# Patient Record
Sex: Male | Born: 1965
Health system: Southern US, Community
[De-identification: ages and names within clinical notes are randomized; demographics above are authoritative.]

## PROBLEM LIST (undated history)

## (undated) DIAGNOSIS — N289 Disorder of kidney and ureter, unspecified: Secondary | ICD-10-CM

## (undated) DIAGNOSIS — E785 Hyperlipidemia, unspecified: Secondary | ICD-10-CM

## (undated) DIAGNOSIS — Z992 Dependence on renal dialysis: Secondary | ICD-10-CM

## (undated) DIAGNOSIS — N186 End stage renal disease: Secondary | ICD-10-CM

## (undated) DIAGNOSIS — E559 Vitamin D deficiency, unspecified: Secondary | ICD-10-CM

## (undated) DIAGNOSIS — K819 Cholecystitis, unspecified: Secondary | ICD-10-CM

## (undated) DIAGNOSIS — H269 Unspecified cataract: Secondary | ICD-10-CM

## (undated) DIAGNOSIS — E1142 Type 2 diabetes mellitus with diabetic polyneuropathy: Secondary | ICD-10-CM

## (undated) DIAGNOSIS — M4646 Discitis, unspecified, lumbar region: Secondary | ICD-10-CM

## (undated) DIAGNOSIS — I82409 Acute embolism and thrombosis of unspecified deep veins of unspecified lower extremity: Secondary | ICD-10-CM

## (undated) DIAGNOSIS — M67919 Unspecified disorder of synovium and tendon, unspecified shoulder: Secondary | ICD-10-CM

## (undated) DIAGNOSIS — K3184 Gastroparesis: Secondary | ICD-10-CM

## (undated) DIAGNOSIS — I509 Heart failure, unspecified: Secondary | ICD-10-CM

## (undated) DIAGNOSIS — I10 Essential (primary) hypertension: Secondary | ICD-10-CM

## (undated) DIAGNOSIS — D649 Anemia, unspecified: Secondary | ICD-10-CM

## (undated) DIAGNOSIS — G5603 Carpal tunnel syndrome, bilateral upper limbs: Secondary | ICD-10-CM

## (undated) DIAGNOSIS — R269 Unspecified abnormalities of gait and mobility: Secondary | ICD-10-CM

## (undated) DIAGNOSIS — N35919 Unspecified urethral stricture, male, unspecified site: Secondary | ICD-10-CM

## (undated) DIAGNOSIS — H9190 Unspecified hearing loss, unspecified ear: Secondary | ICD-10-CM

## (undated) DIAGNOSIS — K219 Gastro-esophageal reflux disease without esophagitis: Secondary | ICD-10-CM

## (undated) DIAGNOSIS — G4730 Sleep apnea, unspecified: Secondary | ICD-10-CM

## (undated) DIAGNOSIS — E11319 Type 2 diabetes mellitus with unspecified diabetic retinopathy without macular edema: Secondary | ICD-10-CM

## (undated) DIAGNOSIS — I38 Endocarditis, valve unspecified: Secondary | ICD-10-CM

## (undated) DIAGNOSIS — R001 Bradycardia, unspecified: Secondary | ICD-10-CM

## (undated) DIAGNOSIS — I5022 Chronic systolic (congestive) heart failure: Secondary | ICD-10-CM

## (undated) DIAGNOSIS — I5020 Unspecified systolic (congestive) heart failure: Secondary | ICD-10-CM

## (undated) DIAGNOSIS — T7840XA Allergy, unspecified, initial encounter: Secondary | ICD-10-CM

## (undated) DIAGNOSIS — A419 Sepsis, unspecified organism: Secondary | ICD-10-CM

## (undated) DIAGNOSIS — I4891 Unspecified atrial fibrillation: Secondary | ICD-10-CM

## (undated) DIAGNOSIS — F40240 Claustrophobia: Secondary | ICD-10-CM

## (undated) DIAGNOSIS — M79606 Pain in leg, unspecified: Secondary | ICD-10-CM

## (undated) DIAGNOSIS — J449 Chronic obstructive pulmonary disease, unspecified: Secondary | ICD-10-CM

## (undated) DIAGNOSIS — R339 Retention of urine, unspecified: Secondary | ICD-10-CM

## (undated) DIAGNOSIS — N2581 Secondary hyperparathyroidism of renal origin: Secondary | ICD-10-CM

## (undated) DIAGNOSIS — I351 Nonrheumatic aortic (valve) insufficiency: Secondary | ICD-10-CM

## (undated) DIAGNOSIS — M869 Osteomyelitis, unspecified: Secondary | ICD-10-CM

## (undated) DIAGNOSIS — I2510 Atherosclerotic heart disease of native coronary artery without angina pectoris: Secondary | ICD-10-CM

## (undated) DIAGNOSIS — M109 Gout, unspecified: Secondary | ICD-10-CM

## (undated) DIAGNOSIS — G4733 Obstructive sleep apnea (adult) (pediatric): Secondary | ICD-10-CM

## (undated) DIAGNOSIS — F4310 Post-traumatic stress disorder, unspecified: Secondary | ICD-10-CM

## (undated) HISTORY — PX: CATARACT EXTRACTION W/ INTRAOCULAR LENS  IMPLANT, BILATERAL: SHX1307

## (undated) HISTORY — DX: Anemia, unspecified: D64.9

## (undated) HISTORY — DX: Gout, unspecified: M10.9

## (undated) HISTORY — DX: Post-traumatic stress disorder, unspecified: F43.10

## (undated) HISTORY — DX: Sleep apnea, unspecified: G47.30

## (undated) HISTORY — DX: Pain in leg, unspecified: M79.606

## (undated) HISTORY — DX: Chronic obstructive pulmonary disease, unspecified: J44.9

## (undated) HISTORY — DX: Obstructive sleep apnea (adult) (pediatric): G47.33

## (undated) HISTORY — PX: UPPER GASTROINTESTINAL ENDOSCOPY: SHX188

## (undated) HISTORY — DX: Unspecified hearing loss, unspecified ear: H91.90

## (undated) HISTORY — DX: Unspecified cataract: H26.9

## (undated) HISTORY — DX: Endocarditis, valve unspecified: I38

## (undated) HISTORY — DX: Osteomyelitis, unspecified: M86.9

## (undated) HISTORY — DX: Heart failure, unspecified: I50.9

## (undated) HISTORY — DX: Gastroparesis: K31.84

## (undated) HISTORY — DX: Carpal tunnel syndrome, bilateral upper limbs: G56.03

## (undated) HISTORY — DX: Dependence on renal dialysis: Z99.2

## (undated) HISTORY — DX: Morbid (severe) obesity due to excess calories: E66.01

## (undated) HISTORY — DX: Type 2 diabetes mellitus with unspecified diabetic retinopathy without macular edema: E11.319

## (undated) HISTORY — DX: Unspecified systolic (congestive) heart failure: I50.20

## (undated) HISTORY — PX: SIGMOIDOSCOPY: SUR1295

## (undated) HISTORY — DX: Nonrheumatic aortic (valve) insufficiency: I35.1

## (undated) HISTORY — PX: ARTERIOVENOUS GRAFT PLACEMENT: SUR1029

## (undated) HISTORY — DX: Unspecified disorder of synovium and tendon, unspecified shoulder: M67.919

## (undated) HISTORY — DX: Vitamin D deficiency, unspecified: E55.9

## (undated) HISTORY — DX: Discitis, unspecified, lumbar region: M46.46

## (undated) HISTORY — DX: Hyperlipidemia, unspecified: E78.5

## (undated) HISTORY — DX: Allergy, unspecified, initial encounter: T78.40XA

## (undated) HISTORY — DX: Secondary hyperparathyroidism of renal origin: N25.81

## (undated) HISTORY — DX: Type 2 diabetes mellitus with diabetic polyneuropathy: E11.42

## (undated) HISTORY — DX: Essential (primary) hypertension: I10

## (undated) HISTORY — DX: Unspecified abnormalities of gait and mobility: R26.9

---
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
## 1998-05-10 ENCOUNTER — Other Ambulatory Visit: Admission: RE | Admit: 1998-05-10 | Discharge: 1998-05-10 | Payer: Self-pay

## 1998-05-25 ENCOUNTER — Emergency Department (HOSPITAL_COMMUNITY): Admission: EM | Admit: 1998-05-25 | Discharge: 1998-05-25 | Payer: Self-pay | Admitting: Emergency Medicine

## 1999-04-09 ENCOUNTER — Encounter: Payer: Self-pay | Admitting: Internal Medicine

## 1999-04-09 ENCOUNTER — Ambulatory Visit (HOSPITAL_COMMUNITY): Admission: RE | Admit: 1999-04-09 | Discharge: 1999-04-09 | Payer: Self-pay | Admitting: Internal Medicine

## 1999-04-16 ENCOUNTER — Encounter: Admission: RE | Admit: 1999-04-16 | Discharge: 1999-07-15 | Payer: Self-pay | Admitting: Internal Medicine

## 1999-08-04 ENCOUNTER — Encounter: Admission: RE | Admit: 1999-08-04 | Discharge: 1999-11-02 | Payer: Self-pay | Admitting: Orthopedic Surgery

## 1999-11-03 ENCOUNTER — Encounter: Admission: RE | Admit: 1999-11-03 | Discharge: 2000-02-01 | Payer: Self-pay | Admitting: Orthopedic Surgery

## 2000-06-15 ENCOUNTER — Encounter: Admission: RE | Admit: 2000-06-15 | Discharge: 2000-09-13 | Payer: Self-pay | Admitting: Orthopedic Surgery

## 2000-07-30 ENCOUNTER — Encounter: Payer: Self-pay | Admitting: Internal Medicine

## 2000-07-30 ENCOUNTER — Inpatient Hospital Stay (HOSPITAL_COMMUNITY): Admission: AD | Admit: 2000-07-30 | Discharge: 2000-08-04 | Payer: Self-pay | Admitting: Internal Medicine

## 2000-07-31 ENCOUNTER — Encounter: Payer: Self-pay | Admitting: Internal Medicine

## 2000-08-26 ENCOUNTER — Encounter: Admission: RE | Admit: 2000-08-26 | Discharge: 2000-08-26 | Payer: Self-pay | Admitting: Family Medicine

## 2000-09-07 ENCOUNTER — Encounter: Admission: RE | Admit: 2000-09-07 | Discharge: 2000-09-07 | Payer: Self-pay | Admitting: Family Medicine

## 2000-09-22 ENCOUNTER — Encounter: Admission: RE | Admit: 2000-09-22 | Discharge: 2000-09-22 | Payer: Self-pay | Admitting: Family Medicine

## 2001-08-23 ENCOUNTER — Encounter: Admission: RE | Admit: 2001-08-23 | Discharge: 2001-11-21 | Payer: Self-pay | Admitting: Internal Medicine

## 2001-09-05 ENCOUNTER — Emergency Department (HOSPITAL_COMMUNITY): Admission: EM | Admit: 2001-09-05 | Discharge: 2001-09-06 | Payer: Self-pay | Admitting: Physical Therapy

## 2001-09-14 ENCOUNTER — Emergency Department (HOSPITAL_COMMUNITY): Admission: EM | Admit: 2001-09-14 | Discharge: 2001-09-14 | Payer: Self-pay | Admitting: Emergency Medicine

## 2001-11-10 ENCOUNTER — Emergency Department (HOSPITAL_COMMUNITY): Admission: EM | Admit: 2001-11-10 | Discharge: 2001-11-10 | Payer: Self-pay | Admitting: Emergency Medicine

## 2001-11-16 ENCOUNTER — Encounter: Payer: Self-pay | Admitting: Internal Medicine

## 2001-11-16 ENCOUNTER — Ambulatory Visit (HOSPITAL_COMMUNITY): Admission: RE | Admit: 2001-11-16 | Discharge: 2001-11-16 | Payer: Self-pay | Admitting: Internal Medicine

## 2001-11-22 ENCOUNTER — Encounter: Admission: RE | Admit: 2001-11-22 | Discharge: 2002-01-13 | Payer: Self-pay | Admitting: Orthopedic Surgery

## 2002-04-27 ENCOUNTER — Emergency Department (HOSPITAL_COMMUNITY): Admission: EM | Admit: 2002-04-27 | Discharge: 2002-04-27 | Payer: Self-pay | Admitting: Emergency Medicine

## 2002-04-29 ENCOUNTER — Ambulatory Visit (HOSPITAL_COMMUNITY): Admission: RE | Admit: 2002-04-29 | Discharge: 2002-04-29 | Payer: Self-pay | Admitting: Emergency Medicine

## 2002-05-04 ENCOUNTER — Ambulatory Visit (HOSPITAL_COMMUNITY): Admission: RE | Admit: 2002-05-04 | Discharge: 2002-05-04 | Payer: Self-pay | Admitting: Internal Medicine

## 2002-06-07 ENCOUNTER — Encounter: Admission: RE | Admit: 2002-06-07 | Discharge: 2002-06-07 | Payer: Self-pay | Admitting: Internal Medicine

## 2002-06-14 ENCOUNTER — Ambulatory Visit (HOSPITAL_COMMUNITY): Admission: RE | Admit: 2002-06-14 | Discharge: 2002-06-14 | Payer: Self-pay | Admitting: Internal Medicine

## 2002-06-14 ENCOUNTER — Encounter: Payer: Self-pay | Admitting: Internal Medicine

## 2002-06-14 ENCOUNTER — Encounter: Admission: RE | Admit: 2002-06-14 | Discharge: 2002-06-14 | Payer: Self-pay | Admitting: Internal Medicine

## 2002-06-15 ENCOUNTER — Encounter: Payer: Self-pay | Admitting: Internal Medicine

## 2002-06-15 ENCOUNTER — Ambulatory Visit (HOSPITAL_COMMUNITY): Admission: RE | Admit: 2002-06-15 | Discharge: 2002-06-15 | Payer: Self-pay | Admitting: Internal Medicine

## 2002-06-20 ENCOUNTER — Encounter: Admission: RE | Admit: 2002-06-20 | Discharge: 2002-06-20 | Payer: Self-pay | Admitting: Internal Medicine

## 2002-06-22 ENCOUNTER — Ambulatory Visit (HOSPITAL_COMMUNITY): Admission: RE | Admit: 2002-06-22 | Discharge: 2002-06-22 | Payer: Self-pay | Admitting: Internal Medicine

## 2002-10-25 ENCOUNTER — Encounter: Admission: RE | Admit: 2002-10-25 | Discharge: 2002-10-25 | Payer: Self-pay | Admitting: Internal Medicine

## 2003-02-14 ENCOUNTER — Encounter (HOSPITAL_BASED_OUTPATIENT_CLINIC_OR_DEPARTMENT_OTHER): Admission: RE | Admit: 2003-02-14 | Discharge: 2003-05-15 | Payer: Self-pay | Admitting: Internal Medicine

## 2003-02-28 ENCOUNTER — Encounter: Admission: RE | Admit: 2003-02-28 | Discharge: 2003-02-28 | Payer: Self-pay | Admitting: Internal Medicine

## 2003-09-22 ENCOUNTER — Inpatient Hospital Stay (HOSPITAL_COMMUNITY): Admission: EM | Admit: 2003-09-22 | Discharge: 2003-09-28 | Payer: Self-pay | Admitting: *Deleted

## 2003-10-01 ENCOUNTER — Encounter: Admission: RE | Admit: 2003-10-01 | Discharge: 2003-10-01 | Payer: Self-pay | Admitting: Internal Medicine

## 2003-10-01 ENCOUNTER — Encounter: Admission: RE | Admit: 2003-10-01 | Discharge: 2003-12-28 | Payer: Self-pay | Admitting: Internal Medicine

## 2003-10-08 ENCOUNTER — Encounter: Admission: RE | Admit: 2003-10-08 | Discharge: 2003-10-08 | Payer: Self-pay | Admitting: Internal Medicine

## 2004-01-03 ENCOUNTER — Inpatient Hospital Stay (HOSPITAL_COMMUNITY): Admission: EM | Admit: 2004-01-03 | Discharge: 2004-01-05 | Payer: Self-pay

## 2004-01-30 ENCOUNTER — Encounter (HOSPITAL_BASED_OUTPATIENT_CLINIC_OR_DEPARTMENT_OTHER): Admission: RE | Admit: 2004-01-30 | Discharge: 2004-04-29 | Payer: Self-pay | Admitting: Internal Medicine

## 2004-07-04 ENCOUNTER — Ambulatory Visit: Payer: Self-pay | Admitting: Internal Medicine

## 2004-07-04 ENCOUNTER — Inpatient Hospital Stay (HOSPITAL_COMMUNITY): Admission: EM | Admit: 2004-07-04 | Discharge: 2004-07-16 | Payer: Self-pay | Admitting: Emergency Medicine

## 2004-07-14 ENCOUNTER — Encounter: Payer: Self-pay | Admitting: Internal Medicine

## 2004-07-23 ENCOUNTER — Ambulatory Visit (HOSPITAL_COMMUNITY): Admission: RE | Admit: 2004-07-23 | Discharge: 2004-07-23 | Payer: Self-pay | Admitting: Nephrology

## 2004-09-01 ENCOUNTER — Ambulatory Visit (HOSPITAL_COMMUNITY): Admission: RE | Admit: 2004-09-01 | Discharge: 2004-09-01 | Payer: Self-pay | Admitting: Vascular Surgery

## 2004-10-12 HISTORY — PX: EYE SURGERY: SHX253

## 2005-03-28 ENCOUNTER — Inpatient Hospital Stay (HOSPITAL_COMMUNITY): Admission: EM | Admit: 2005-03-28 | Discharge: 2005-04-03 | Payer: Self-pay | Admitting: Emergency Medicine

## 2005-04-30 ENCOUNTER — Encounter (HOSPITAL_BASED_OUTPATIENT_CLINIC_OR_DEPARTMENT_OTHER): Admission: RE | Admit: 2005-04-30 | Discharge: 2005-07-29 | Payer: Self-pay | Admitting: Surgery

## 2005-05-11 ENCOUNTER — Encounter: Admission: RE | Admit: 2005-05-11 | Discharge: 2005-05-11 | Payer: Self-pay | Admitting: Nephrology

## 2005-06-10 ENCOUNTER — Encounter: Admission: RE | Admit: 2005-06-10 | Discharge: 2005-06-10 | Payer: Self-pay | Admitting: Nephrology

## 2005-06-11 ENCOUNTER — Ambulatory Visit: Payer: Self-pay | Admitting: Cardiology

## 2005-06-11 ENCOUNTER — Inpatient Hospital Stay (HOSPITAL_COMMUNITY): Admission: EM | Admit: 2005-06-11 | Discharge: 2005-06-13 | Payer: Self-pay | Admitting: Emergency Medicine

## 2005-06-26 ENCOUNTER — Encounter: Admission: RE | Admit: 2005-06-26 | Discharge: 2005-08-11 | Payer: Self-pay | Admitting: Orthopedic Surgery

## 2005-12-01 ENCOUNTER — Encounter (HOSPITAL_BASED_OUTPATIENT_CLINIC_OR_DEPARTMENT_OTHER): Admission: RE | Admit: 2005-12-01 | Discharge: 2006-01-12 | Payer: Self-pay | Admitting: Surgery

## 2006-03-23 ENCOUNTER — Ambulatory Visit: Payer: Self-pay | Admitting: Cardiology

## 2006-03-30 ENCOUNTER — Encounter: Payer: Self-pay | Admitting: Cardiology

## 2006-03-30 ENCOUNTER — Ambulatory Visit: Payer: Self-pay

## 2006-05-13 ENCOUNTER — Ambulatory Visit: Payer: Self-pay | Admitting: Cardiology

## 2006-06-08 ENCOUNTER — Emergency Department (HOSPITAL_COMMUNITY): Admission: EM | Admit: 2006-06-08 | Discharge: 2006-06-09 | Payer: Self-pay | Admitting: Emergency Medicine

## 2006-07-01 ENCOUNTER — Ambulatory Visit: Payer: Self-pay | Admitting: Pulmonary Disease

## 2006-08-20 ENCOUNTER — Ambulatory Visit: Payer: Self-pay | Admitting: Pulmonary Disease

## 2006-08-20 ENCOUNTER — Ambulatory Visit (HOSPITAL_BASED_OUTPATIENT_CLINIC_OR_DEPARTMENT_OTHER): Admission: RE | Admit: 2006-08-20 | Discharge: 2006-08-20 | Payer: Self-pay | Admitting: Pulmonary Disease

## 2006-09-09 ENCOUNTER — Ambulatory Visit: Payer: Self-pay | Admitting: Pulmonary Disease

## 2006-09-28 ENCOUNTER — Ambulatory Visit: Payer: Self-pay | Admitting: Pulmonary Disease

## 2006-11-03 ENCOUNTER — Ambulatory Visit: Payer: Self-pay | Admitting: Pulmonary Disease

## 2006-11-03 ENCOUNTER — Ambulatory Visit (HOSPITAL_BASED_OUTPATIENT_CLINIC_OR_DEPARTMENT_OTHER): Admission: RE | Admit: 2006-11-03 | Discharge: 2006-11-03 | Payer: Self-pay | Admitting: Pulmonary Disease

## 2007-04-08 ENCOUNTER — Encounter: Admission: RE | Admit: 2007-04-08 | Discharge: 2007-04-08 | Payer: Self-pay | Admitting: Nephrology

## 2007-04-12 ENCOUNTER — Ambulatory Visit (HOSPITAL_COMMUNITY): Admission: RE | Admit: 2007-04-12 | Discharge: 2007-04-12 | Payer: Self-pay | Admitting: Nephrology

## 2007-06-09 ENCOUNTER — Emergency Department (HOSPITAL_COMMUNITY): Admission: EM | Admit: 2007-06-09 | Discharge: 2007-06-09 | Payer: Self-pay | Admitting: Family Medicine

## 2007-06-09 ENCOUNTER — Emergency Department (HOSPITAL_COMMUNITY): Admission: EM | Admit: 2007-06-09 | Discharge: 2007-06-09 | Payer: Self-pay | Admitting: Emergency Medicine

## 2007-08-05 ENCOUNTER — Emergency Department (HOSPITAL_COMMUNITY): Admission: EM | Admit: 2007-08-05 | Discharge: 2007-08-05 | Payer: Self-pay | Admitting: Family Medicine

## 2007-08-21 ENCOUNTER — Emergency Department (HOSPITAL_COMMUNITY): Admission: EM | Admit: 2007-08-21 | Discharge: 2007-08-22 | Payer: Self-pay | Admitting: Emergency Medicine

## 2007-08-22 ENCOUNTER — Ambulatory Visit: Payer: Self-pay | Admitting: *Deleted

## 2007-08-22 ENCOUNTER — Encounter (INDEPENDENT_AMBULATORY_CARE_PROVIDER_SITE_OTHER): Payer: Self-pay | Admitting: Emergency Medicine

## 2007-08-22 ENCOUNTER — Ambulatory Visit (HOSPITAL_COMMUNITY): Admission: RE | Admit: 2007-08-22 | Discharge: 2007-08-22 | Payer: Self-pay | Admitting: Emergency Medicine

## 2007-10-13 HISTORY — PX: KIDNEY TRANSPLANT: SHX239

## 2007-11-03 ENCOUNTER — Encounter: Admission: RE | Admit: 2007-11-03 | Discharge: 2007-11-03 | Payer: Self-pay | Admitting: Nephrology

## 2007-12-11 ENCOUNTER — Inpatient Hospital Stay (HOSPITAL_COMMUNITY): Admission: EM | Admit: 2007-12-11 | Discharge: 2007-12-13 | Payer: Self-pay | Admitting: Emergency Medicine

## 2007-12-11 ENCOUNTER — Ambulatory Visit: Payer: Self-pay | Admitting: Cardiology

## 2007-12-13 ENCOUNTER — Encounter (INDEPENDENT_AMBULATORY_CARE_PROVIDER_SITE_OTHER): Payer: Self-pay | Admitting: Cardiology

## 2007-12-27 ENCOUNTER — Ambulatory Visit (HOSPITAL_COMMUNITY): Admission: RE | Admit: 2007-12-27 | Discharge: 2007-12-27 | Payer: Self-pay | Admitting: Nephrology

## 2008-01-17 ENCOUNTER — Ambulatory Visit: Payer: Self-pay | Admitting: Vascular Surgery

## 2008-01-19 ENCOUNTER — Ambulatory Visit: Payer: Self-pay | Admitting: Vascular Surgery

## 2008-01-19 ENCOUNTER — Ambulatory Visit (HOSPITAL_COMMUNITY): Admission: RE | Admit: 2008-01-19 | Discharge: 2008-01-19 | Payer: Self-pay | Admitting: Vascular Surgery

## 2008-02-16 ENCOUNTER — Telehealth: Payer: Self-pay | Admitting: Pulmonary Disease

## 2008-02-28 ENCOUNTER — Encounter (INDEPENDENT_AMBULATORY_CARE_PROVIDER_SITE_OTHER): Payer: Self-pay | Admitting: *Deleted

## 2008-05-25 DIAGNOSIS — I10 Essential (primary) hypertension: Secondary | ICD-10-CM | POA: Insufficient documentation

## 2008-05-25 DIAGNOSIS — E1139 Type 2 diabetes mellitus with other diabetic ophthalmic complication: Secondary | ICD-10-CM | POA: Insufficient documentation

## 2008-05-25 DIAGNOSIS — E785 Hyperlipidemia, unspecified: Secondary | ICD-10-CM | POA: Insufficient documentation

## 2008-05-25 DIAGNOSIS — G4733 Obstructive sleep apnea (adult) (pediatric): Secondary | ICD-10-CM | POA: Insufficient documentation

## 2008-05-25 DIAGNOSIS — F528 Other sexual dysfunction not due to a substance or known physiological condition: Secondary | ICD-10-CM | POA: Insufficient documentation

## 2008-05-28 ENCOUNTER — Ambulatory Visit: Payer: Self-pay | Admitting: Pulmonary Disease

## 2008-06-14 ENCOUNTER — Encounter: Payer: Self-pay | Admitting: Pulmonary Disease

## 2008-07-03 ENCOUNTER — Encounter: Payer: Self-pay | Admitting: Pulmonary Disease

## 2008-07-19 ENCOUNTER — Ambulatory Visit: Payer: Self-pay | Admitting: Pulmonary Disease

## 2008-11-21 ENCOUNTER — Encounter: Admission: RE | Admit: 2008-11-21 | Discharge: 2009-01-25 | Payer: Self-pay | Admitting: Neurology

## 2009-02-28 ENCOUNTER — Ambulatory Visit (HOSPITAL_COMMUNITY): Admission: RE | Admit: 2009-02-28 | Discharge: 2009-02-28 | Payer: Self-pay | Admitting: Neurology

## 2009-11-04 ENCOUNTER — Encounter: Payer: Self-pay | Admitting: Internal Medicine

## 2009-12-09 ENCOUNTER — Encounter: Payer: Self-pay | Admitting: Internal Medicine

## 2009-12-31 ENCOUNTER — Inpatient Hospital Stay (HOSPITAL_COMMUNITY): Admission: EM | Admit: 2009-12-31 | Discharge: 2010-01-02 | Payer: Self-pay | Admitting: Emergency Medicine

## 2010-01-15 ENCOUNTER — Encounter (INDEPENDENT_AMBULATORY_CARE_PROVIDER_SITE_OTHER): Payer: Self-pay | Admitting: *Deleted

## 2010-01-15 ENCOUNTER — Encounter: Payer: Self-pay | Admitting: Gastroenterology

## 2010-01-16 ENCOUNTER — Encounter: Payer: Self-pay | Admitting: Internal Medicine

## 2010-01-27 ENCOUNTER — Telehealth (INDEPENDENT_AMBULATORY_CARE_PROVIDER_SITE_OTHER): Payer: Self-pay | Admitting: *Deleted

## 2010-01-27 DIAGNOSIS — M7500 Adhesive capsulitis of unspecified shoulder: Secondary | ICD-10-CM | POA: Insufficient documentation

## 2010-01-27 DIAGNOSIS — N186 End stage renal disease: Secondary | ICD-10-CM | POA: Insufficient documentation

## 2010-01-27 DIAGNOSIS — F419 Anxiety disorder, unspecified: Secondary | ICD-10-CM | POA: Insufficient documentation

## 2010-01-27 DIAGNOSIS — M109 Gout, unspecified: Secondary | ICD-10-CM | POA: Insufficient documentation

## 2010-01-27 DIAGNOSIS — K3184 Gastroparesis: Secondary | ICD-10-CM | POA: Insufficient documentation

## 2010-01-27 DIAGNOSIS — E211 Secondary hyperparathyroidism, not elsewhere classified: Secondary | ICD-10-CM | POA: Insufficient documentation

## 2010-01-27 DIAGNOSIS — G4700 Insomnia, unspecified: Secondary | ICD-10-CM | POA: Insufficient documentation

## 2010-01-27 DIAGNOSIS — E109 Type 1 diabetes mellitus without complications: Secondary | ICD-10-CM | POA: Insufficient documentation

## 2010-01-30 ENCOUNTER — Ambulatory Visit: Payer: Self-pay | Admitting: Internal Medicine

## 2010-01-30 DIAGNOSIS — Z940 Kidney transplant status: Secondary | ICD-10-CM | POA: Insufficient documentation

## 2010-01-30 DIAGNOSIS — K6289 Other specified diseases of anus and rectum: Secondary | ICD-10-CM | POA: Insufficient documentation

## 2010-01-30 DIAGNOSIS — R197 Diarrhea, unspecified: Secondary | ICD-10-CM | POA: Insufficient documentation

## 2010-01-30 DIAGNOSIS — R112 Nausea with vomiting, unspecified: Secondary | ICD-10-CM | POA: Insufficient documentation

## 2010-01-30 DIAGNOSIS — D899 Disorder involving the immune mechanism, unspecified: Secondary | ICD-10-CM | POA: Insufficient documentation

## 2010-02-03 ENCOUNTER — Ambulatory Visit: Payer: Self-pay | Admitting: Gastroenterology

## 2010-02-05 ENCOUNTER — Telehealth: Payer: Self-pay | Admitting: Internal Medicine

## 2010-02-14 ENCOUNTER — Encounter: Payer: Self-pay | Admitting: Internal Medicine

## 2010-02-20 ENCOUNTER — Encounter: Payer: Self-pay | Admitting: Internal Medicine

## 2010-04-17 ENCOUNTER — Encounter: Payer: Self-pay | Admitting: Cardiovascular Disease

## 2010-04-23 ENCOUNTER — Encounter: Payer: Self-pay | Admitting: Cardiovascular Disease

## 2010-05-01 ENCOUNTER — Encounter: Payer: Self-pay | Admitting: Cardiovascular Disease

## 2010-05-02 ENCOUNTER — Encounter: Payer: Self-pay | Admitting: Cardiovascular Disease

## 2010-06-03 ENCOUNTER — Ambulatory Visit: Payer: Self-pay | Admitting: Cardiovascular Disease

## 2010-06-03 DIAGNOSIS — R079 Chest pain, unspecified: Secondary | ICD-10-CM | POA: Insufficient documentation

## 2010-06-23 ENCOUNTER — Ambulatory Visit: Payer: Self-pay

## 2010-06-24 ENCOUNTER — Telehealth (INDEPENDENT_AMBULATORY_CARE_PROVIDER_SITE_OTHER): Payer: Self-pay

## 2010-06-25 ENCOUNTER — Ambulatory Visit: Payer: Self-pay

## 2010-06-25 ENCOUNTER — Ambulatory Visit (HOSPITAL_COMMUNITY): Admission: RE | Admit: 2010-06-25 | Discharge: 2010-06-25 | Payer: Self-pay | Admitting: Cardiovascular Disease

## 2010-06-25 ENCOUNTER — Encounter: Payer: Self-pay | Admitting: Cardiology

## 2010-06-25 ENCOUNTER — Encounter: Payer: Self-pay | Admitting: Cardiovascular Disease

## 2010-06-25 ENCOUNTER — Encounter (HOSPITAL_COMMUNITY): Admission: RE | Admit: 2010-06-25 | Discharge: 2010-08-13 | Payer: Self-pay | Admitting: Cardiovascular Disease

## 2010-06-25 ENCOUNTER — Ambulatory Visit: Payer: Self-pay | Admitting: Cardiology

## 2010-06-27 ENCOUNTER — Ambulatory Visit: Payer: Self-pay | Admitting: Cardiovascular Disease

## 2010-06-27 DIAGNOSIS — I119 Hypertensive heart disease without heart failure: Secondary | ICD-10-CM | POA: Insufficient documentation

## 2010-07-04 ENCOUNTER — Encounter: Payer: Self-pay | Admitting: Cardiovascular Disease

## 2010-07-04 ENCOUNTER — Telehealth: Payer: Self-pay | Admitting: Cardiovascular Disease

## 2010-07-29 ENCOUNTER — Encounter: Payer: Self-pay | Admitting: Cardiovascular Disease

## 2010-07-30 ENCOUNTER — Encounter: Payer: Self-pay | Admitting: Cardiovascular Disease

## 2010-10-11 ENCOUNTER — Emergency Department (HOSPITAL_COMMUNITY)
Admission: EM | Admit: 2010-10-11 | Discharge: 2010-10-11 | Payer: Self-pay | Source: Home / Self Care | Admitting: Emergency Medicine

## 2010-11-02 ENCOUNTER — Encounter: Payer: Self-pay | Admitting: Neurology

## 2010-11-02 ENCOUNTER — Encounter: Payer: Self-pay | Admitting: Nephrology

## 2010-11-03 ENCOUNTER — Encounter: Payer: Self-pay | Admitting: Neurology

## 2010-11-03 ENCOUNTER — Encounter: Payer: Self-pay | Admitting: Nephrology

## 2010-11-13 NOTE — Letter (Signed)
Summary: [facility]   Imported By: Phillis Knack 02/06/2010 12:02:09  _____________________________________________________________________  External Attachment:    Type:   Image     Comment:   External Document

## 2010-11-13 NOTE — Letter (Signed)
Summary: [facility]   Imported By: Rise Patience 03/19/2010 16:15:47  _____________________________________________________________________  External Attachment:    Type:   Image     Comment:   External Document

## 2010-11-13 NOTE — Letter (Signed)
Summary: [facility], [place]  [address] [place]   [place],  [address]   Phone: [phone]  Fax: [phone]     July 04, 2010    [name] [name]   The above named patient had a medical visit on 06/27/10 with Dr. Lauree Chandler and had a stress test on 06/24/10 and an echocardiogram on 06/25/10.  Please take this into consideration when reviewing the time away from school.    Sincerely yours,  [facility] Jeannine Boga,  RN

## 2010-11-13 NOTE — Letter (Signed)
Summary: [facility]   Imported By: Phillis Knack 02/06/2010 12:00:49  _____________________________________________________________________  External Attachment:    Type:   Image     Comment:   External Document

## 2010-11-13 NOTE — Progress Notes (Signed)
Summary: [name] [name] pt.  ---- Converted from flag ---- ---- 01/27/2010 12:16 PM, Inda Castle MD wrote: He can see Dr. Carlean [name]  ---- 01/27/2010 12:11 PM, Bernita Buffy CMA (AAMA) wrote: patient is on your schedule for wed  but he has had a procedure with Dr. Carlean [name] 2005 wile inpt at [facility] and Dr. Carlean [name] did consult while he was inpt then. Let me know if you want to see him or have him moved to Dr. Carlean [name].  Thanks  [name] [name] ------------------------------ [name] [name] will contact the patient and resch him with Dr. Carlean [name].

## 2010-11-13 NOTE — Progress Notes (Signed)
Summary: OV needed with Dr. Jessie Foot  Phone Note Outgoing Call   Call placed by: Francesca Jewett CMA,  Feb 16, 2008 12:19 PM Call placed to: Patient Summary of Call: Left message for patient to call and make follow-up appointment with Dr. Halford Chessman. CMN for CPAP supplies cannot be completed until the patient comes in for OV. Initial call taken by: Francesca Jewett CMA,  Feb 16, 2008 12:21 PM  Follow-up for Phone Call        [facility] for appt w/ [place]  Feb 20, 2008 5:00 PM  [facility]. Francesca Jewett CMA  Feb 24, 2008 3:55 PM    Additional Follow-up for Phone Call Additional follow up Details #1::        Vallarie Mare,  Please send a letter to this patients home to call for an appt with Dr. Halford Chessman. Thanks, Lori  SENT LETTER OUT ON 02/28/08. CHANTEL

## 2010-11-13 NOTE — Letter (Signed)
Summary: [facility]   Imported By: Rise Patience 01/30/2010 14:56:49  _____________________________________________________________________  External Attachment:    Type:   Image     Comment:   External Document

## 2010-11-13 NOTE — Progress Notes (Signed)
Summary: pt needs note for work  Phone Note Call from Patient Call back at [employer] [phone]   Caller: Patient Reason for Call: Talk to Nurse, Talk to Doctor Summary of Call: pt needs a note for work for the days he had testing and MD appt Initial call taken by: Shelda Pal,  July 04, 2010 3:52 PM
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
## 2010-11-13 NOTE — Assessment & Plan Note (Signed)
Summary: rov///kp   Referred by:  Legrand Como Altheimer PCP:  Jamal Maes  Chief Complaint:  Pt states he is doing well on CPAP. He soes c/o some left-sided nasal congestion at times..  History of Present Illness: I saw [name] [name] in follow up for his severe OSA.  He says that his DME never picked up his CPAP download.  He feels that he is able to tolerate his CPAP better now.  He uses the machine on a nightly basis, and for the majority of the night.  He gets congestion and feels like something is blocking the left side of his nose.  He has difficulty using his CPAP because of this.    He is currently using a full face mask with humidifer.     Prior Medications Reviewed Using: Patient Recall  Prior Medication List:  HUMULIN 70/30 70-30 %  SUSP (INSULIN ISOPHANE & REGULAR) 30 units in AM and 28 units in PM CLONAZEPAM 0.5 MG  TABS (CLONAZEPAM) 1 to 2 at bedtime SIMVASTATIN 20 MG  TABS (SIMVASTATIN) 1 by mouth daily ALLEGRA 60 MG  TABS (FEXOFENADINE HCL) 1 by mouth two times a day METOPROLOL TARTRATE 25 MG  TABS (METOPROLOL TARTRATE) 1 by mouth two times a day HYDRALAZINE HCL 25 MG  TABS (HYDRALAZINE HCL) 1 by mouth four times daily BACTRIM DS 800-160 MG  TABS (SULFAMETHOXAZOLE-TRIMETHOPRIM) 1 by mouth daily PRILOSEC 20 MG  CPDR (OMEPRAZOLE) 1 by mouth daily PROGRAF 1 MG  CAPS (TACROLIMUS) 1 by mouth two times a day CELLCEPT 250 MG  CAPS (MYCOPHENOLATE MOFETIL) 4 by mouth two times a day PREDNISONE 5 MG  TABS (PREDNISONE) 2 by mouth daily VALCYTE 450 MG  TABS (VALGANCICLOVIR HCL) M,W,F FLUCONAZOLE 50 MG  TABS (FLUCONAZOLE) 1 by mouth daily LASIX 40 MG  TABS (FUROSEMIDE) 120 mg in the morning and 80 mg at night FLOMAX 0.4 MG  XR24H-CAP (TAMSULOSIN HCL) 1 by mouth daily   Current Allergies (reviewed today): ! PCN  Past Medical History:    IDDM    ESRD s/p renal transplant    Retinopathy    HTN    Dyslipidemia    Erectile dysfunction    PTSD    Pericardial effusion  Pneumonia    ACE induced cough    Severe OSA, PSG 11/07 AHI [id number]    Diastolic dysfunction          Vital Signs:  Patient Profile:   45 Years Old Male Weight:      269 pounds O2 Sat:      98 % O2 treatment:    Room Air Temp:     97.7 degrees F oral Pulse rate:   97 / minute BP sitting:   110 / 66  (right arm) Cuff size:   regular  Vitals Entered By: Francesca Jewett CMA (July 19, 2008 11:39 AM)                 Physical Exam  General:     normal appearance and obese.   Nose:     no sinus tenderness, clear nasal discharge Mouth:     MP 4, enlarged tongue Neck:     no LAN Lungs:     Decreased breath sounds, no wheezing Heart:     regular rhythm, normal rate, and no murmurs.   Abdomen:     obese, soft, well healed surgical scar Extremities:     1+ edema      Impression & Recommendations:  Problem #  1:  OBSTRUCTIVE SLEEP APNEA (ICD-327.23) I have encouraged him to maintain his compliance with CPAP therapy.  I will contact his DME company to get a report from his CPAP machine.  I will refer him to ENT to evaluate what the nature of his left sided nasal blockage is.  Hopefully by addressing this he will be able to tolerate CPAP therapy better.   Complete Medication List: 1)  Humulin 70/30 70-30 % Susp (Insulin isophane & regular) .... 30 units in am and 28 units in pm 2)  Clonazepam 0.5 Mg Tabs (Clonazepam) .Marland Kitchen.. 1 to 2 at bedtime 3)  Simvastatin 20 Mg Tabs (Simvastatin) .Marland Kitchen.. 1 by mouth daily 4)  Allegra 60 Mg Tabs (Fexofenadine hcl) .Marland Kitchen.. 1 by mouth two times a day 5)  Metoprolol Tartrate 25 Mg Tabs (Metoprolol tartrate) .Marland Kitchen.. 1 by mouth two times a day 6)  Hydralazine Hcl 25 Mg Tabs (Hydralazine hcl) .Marland Kitchen.. 1 by mouth four times daily 7)  Bactrim Ds 800-160 Mg Tabs (Sulfamethoxazole-trimethoprim) .Marland Kitchen.. 1 by mouth daily 8)  Prilosec 20 Mg Cpdr (Omeprazole) .Marland Kitchen.. 1 by mouth daily 9)  Prograf 1 Mg Caps (Tacrolimus) .Marland Kitchen.. 1 by mouth two times a day 10)  Cellcept 250 Mg  Caps (Mycophenolate mofetil) .... 4 by mouth two times a day 11)  Prednisone 5 Mg Tabs (Prednisone) .... 2 by mouth daily 12)  Valcyte 450 Mg Tabs (Valganciclovir hcl) .... M,w,f 13)  Fluconazole 50 Mg Tabs (Fluconazole) .Marland Kitchen.. 1 by mouth daily 14)  Lasix 40 Mg Tabs (Furosemide) .Marland Kitchen.. 120 mg in the morning and 80 mg at night 15)  Flomax 0.4 Mg Xr24h-cap (Tamsulosin hcl) .Marland Kitchen.. 1 by mouth daily   Patient Instructions: 1)  ENT referral 2)  Follow up in 6 months   ]

## 2010-11-13 NOTE — Procedures (Signed)
Summary: EGD   EGD  Procedure date:  07/14/2004  Findings:      Location: [facility]    EGD  Procedure date:  07/14/2004  Findings:      Location: [facility]   Patient Name: [name] [name], [name] [name] MRN:  Procedure Procedures: Panendoscopy (EGD) CPT: A5739879.    with balloon dilation. CPT: T2677397.  Personnel: Endoscopist: Gatha Mayer, MD, [facility].  Referred By: Roney Jaffe, MD.  Exam Location: Exam performed in Endoscopy Suite. [facility]  Patient Consent: Procedure, Alternatives, Risks and Benefits discussed, consent obtained,  Indications Symptoms: Nausea. Vomiting.  Comments: PERSISTENT NAUSEA AND VOMITING, DM, NEW ESRD  History  Current Medications: Patient is not currently taking Coumadin.  Pre-Exam Physical: Performed Jul 14, 2004  Cardio-pulmonary exam, HEENT exam, Abdominal exam, Mental status exam WNL.  Exam Exam Info: Maximum depth of insertion Duodenum, intended Duodenum. Patient position: on left side. Gastric retroflexion performed. Images taken. ASA Classification: III. Tolerance: excellent.  Sedation Meds: Patient assessed and found to be appropriate for moderate (conscious) sedation. Cetacaine Spray 2 sprays given aerosolized. Versed 2 mg. given IV. Fentanyl 25 mcg. given IV.  Monitoring: BP and pulse monitoring done. Oximetry used. Supplemental O2 given  Fluoroscopy: Fluoroscopy was not used.  Findings - Normal: Proximal Esophagus to Body.  MUCOSAL ABNORMALITY: Antrum. Erythematous mucosa. ICD9: Gastritis, Unspecified: 535.50.  Normal: Duodenal Bulb to Duodenal 2nd Portion.   Assessment Abnormal examination, see findings above.  Diagnoses: 535.50: Gastritis, Unspecified.   Events  Unplanned Intervention: No unplanned interventions were required.  Plans Comments: CONTINUE REGLAN AND ZOFRAN Disposition: After procedure patient sent to recovery.  Comments: WILL ROUND TOMORROW  Patient Name: [name] [name], [name] [name] MRN:   Amendment 1 - Jul 14, 2004 0 Procedure Procedures: DELETED<<   with balloon dilation. CPT: T2677397. >>DELETED [Fellow]  CC: Roney Jaffe, MD  This report was created from the original endoscopy report, which was reviewed and signed by the above listed endoscopist.

## 2010-11-13 NOTE — Letter (Signed)
Summary: New Patient letter  [facility]  [address] [place], [place] [address]   Phone: [phone]  Fax: [phone]       01/15/2010 MRN: [id number]  [address] [place], [place]  [address]  Dear Mr. Gergely,  Welcome to the Gastroenterology Division at [facility].    You are scheduled to see Dr. Erskine Emery on January 29, 2010 at 2:00pm on the 3rd floor at [facility] [place] [facility].  We ask that you try to arrive at our office 15 minutes prior to your appointment time to allow for check-in.  We would like you to complete the enclosed self-administered evaluation form prior to your visit and bring it with you on the day of your appointment.  We will review it with you.  Also, please bring a complete list of all your medications or, if you prefer, bring the medication bottles and we will list them.  Please bring your insurance card so that we may make a copy of it.  If your insurance requires a referral to see a specialist, please bring your referral form from your primary care physician.  Co-payments are due at the time of your visit and may be paid by cash, check or credit card.     Your office visit will consist of a consult with your physician (includes a physical exam), any laboratory testing he/she may order, scheduling of any necessary diagnostic testing (e.g. x-ray, ultrasound, CT-scan), and scheduling of a procedure (e.g. Endoscopy, Colonoscopy) if required.  Please allow enough time on your schedule to allow for any/all of these possibilities.    If you cannot keep your appointment, please call [phone] to cancel or reschedule prior to your appointment date.  This allows [place] the opportunity to schedule an appointment for another patient in need of care.  If you do not cancel or reschedule by 5 p.m. the business day prior to your appointment date, you will be charged a $50.00 late cancellation/no-show fee.    Thank you for choosing  [facility] for your medical needs.  We appreciate the opportunity to care for you.  Please visit [place] at our website  to learn more about our practice.                     Sincerely,                                                             The Gastroenterology Division

## 2010-11-13 NOTE — Progress Notes (Signed)
Summary: Nuc. Pre-Procedure  Phone Note Outgoing Call Call back at [facility] Phone [phone]   Call placed by: Irven Baltimore, RN,  June 24, 2010 1:26 PM Summary of Call: Reviewed information on [employer] Information Sheet (see scanned document for further details).  Spoke with patient.     Nuclear Med Background Indications for Stress Test: Evaluation for Ischemia   History: Abnormal EKG, Echo, Myocardial Perfusion Study  History Comments: '07 [id number] perfusion abnormality (can not exclude minimal anterior ischemia), EF47 %. '09 Echo: NL  Symptoms: Chest Pain, Chest Pressure, Fatigue    Nuclear Pre-Procedure Cardiac Risk Factors: Hypertension, IDDM Type 1, Lipids, Obesity Height (in): 72

## 2010-11-13 NOTE — Progress Notes (Signed)
Summary: Endo/Flex results  Phone Note Call from Patient Call back at [phone]   Caller: Patient Call For: Dr. Carlean Purl Reason for Call: Lab or Test Results Summary of Call: Calling about endo/flex results Initial call taken by: Webb Laws,  February 05, 2010 8:35 AM  Follow-up for Phone Call        See flex/EGD reports from Dr Ardis Hughs.  Patient  no longer has diarrhea, but still having rectal pain.  He would like to know what the next step is.  He is aware Dr Carlean Purl is out of the office.  I have advised him that we will have Dr Carlean Purl review and advise when he returns next week. Follow-up by: Barb Merino RN, [place],  February 05, 2010 8:53 AM  Additional Follow-up for Phone Call Additional follow up Details #1::        No significant patholiogy found I think rectal pain is irritation from diarrhea rec lidocaine cream as rxed f/u as needed cc Dr. Sharyne Richters Additional Follow-up by: Gatha Mayer MD, Marval Regal,  Feb 11, 2010 1:45 PM    Additional Follow-up for Phone Call Additional follow up Details #2::    Left message for patient to call back [place], CGRN  Feb 11, 2010 2:17 PM  Left message for patient to call back [place], CGRN  Feb 12, 2010 10:51 AM  patient aware.  He will call back for an appointment as he needs it. Follow-up by: Barb Merino RN, CGRN,  Feb 13, 2010 9:42 AM  New/Updated Medications: LIDOCAINE-PRILOCAINE 2.5-2.5 % CREA (LIDOCAINE-PRILOCAINE) apply to anal area two times a day-tid Prescriptions: LIDOCAINE-PRILOCAINE 2.5-2.5 % CREA (LIDOCAINE-PRILOCAINE) apply to anal area two times a day-tid  #30 grams x 0   Entered and Authorized by:   Gatha Mayer MD, [facility]   Signed by:   Gatha Mayer MD, FACG on 02/11/2010   Method used:   Electronically to        [facility] (retail)       [place], [place]  [id number]       Ph: [phone]       Fax: [phone]   RxID:   [id number]

## 2010-11-13 NOTE — Assessment & Plan Note (Signed)
Summary: Cardiology Nuclear Testing  Nuclear Med Background Indications for Stress Test: Evaluation for Ischemia   History: Abnormal EKG, Echo, Myocardial Perfusion Study  History Comments: '07 [id number] perfusion abnormality (can not exclude minimal anterior ischemia), EF47 %. '09 Echo: NL  Symptoms: Chest Pain, Chest Pressure, DOE, Fatigue, Rapid HR  Symptoms Comments: Last episode of CP- 1 month ago   Nuclear Pre-Procedure Cardiac Risk Factors: Hypertension, IDDM Type 1, Lipids, Obesity Caffeine/Decaff Intake: None NPO After: 8:00 PM Lungs: clear IV 0.9% NS with Angio Cath: 24g     IV Site: L Wrist IV Started by: Crissie Figures, RN Chest Size (in) 50     Height (in): 72 Weight (lb): 294 BMI: 40.02  Nuclear Med Study 1 or 2 day study:  2 day     Stress Test Type:  Lexiscan Reading MD:  Dola Argyle, MD     Referring MD:  Estevan Ryder Resting Radionuclide:  Technetium 28m Tetrofosmin     Resting Radionuclide Dose:  33 mCi  Stress Radionuclide:  Technetium 23m Tetrofosmin     Stress Radionuclide Dose:  33 mCi   Stress Protocol      Max HR:  96 bpm     Predicted Max HR:  [id number] bpm  Max Systolic BP: 99991111 mm Hg     Percent Max HR:  55.17 %Rate Pressure Product:  [id number]  Lexiscan: 0.4 mg   Stress Test Technologist:  Crissie Figures, RN     Nuclear Technologist:  Charlton Amor, CNMT  Rest Procedure  Myocardial perfusion imaging was performed at rest 45 minutes following the intravenous administration of Technetium 50m Tetrofosmin.  Stress Procedure  The patient received IV Lexiscan 0.4 mg over 15-seconds.  Technetium 70m Tetrofosmin injected at 30-seconds.  There were no significant changes with infusion. Patient denied any chest pain.  Quantitative spect images were obtained after a 45 minute delay.  QPS Raw Data Images:  Normal; no motion artifact; normal heart/lung ratio. Stress Images:  Moderate patchy decrease in uptake at the base/mid anterior wall Rest Images:   similar to stress Subtraction (SDS):  No evidence of ischemia. Transient Ischemic Dilatation:  1.12  (Normal <1.22)  Lung/Heart Ratio:  .30  (Normal <0.45)  Quantitative Gated Spect Images QGS EDV:  155 ml QGS ESV:  75 ml QGS EF:  52 % QGS cine images:  No definite focal abnormality  Findings abnormal      Overall Impression  Exercise Capacity: Lexiscan with no exercise. BP Response: Normal blood pressure response. Clinical Symptoms: head full ECG Impression: No significant ST segment change suggestive of ischemia. Overall Impression Comments: Prior study reports mention slight decrease in activity in the anterior wall. The decreased activity in the anterior  base/ mid wall seems more marked now  than described previously. This may represent some scar, although there is no definite focal wall abnormality. There is no significant ischemia.  Appended Document: Cardiology Nuclear Testing Low risk stress. D/W pt at follow up today. cdm
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
## 2010-11-13 NOTE — Procedures (Signed)
Summary: Flexible Sigmoidoscopy  Patient: [name] [name] Note: All result statuses are Final unless otherwise noted.  Tests: (1) Flexible Sigmoidoscopy (FLX)  FLX Flexible Sigmoidoscopy                             DONE     [facility] [address].     [place], [place]  [id number]           FLEXIBLE SIGMOIDOSCOPY PROCEDURE REPORT           PATIENT:  [name], [name]  MR#:  [id number]     BIRTHDATE:  04/26/1966, 44 yrs. old  GENDER:  male     ENDOSCOPIST:  Milus Banister, MD     Referred by:  Gatha Mayer, M.D., [facility]     Jamal Maes, M.D.           PROCEDURE DATE:  02/03/2010     PROCEDURE:  Flexible Sigmoidoscopy with biopsy, Flexible     Sigmoidoscopy with polypectomy     ASA CLASS:  Class III     INDICATIONS:  2 months of diarrhea, s/p renal transplant and so is     immunocompromised           MEDICATIONS:   Fentanyl 25 mcg IV, Versed 5 mg IV           DESCRIPTION OF PROCEDURE:   After the risks benefits and     alternatives of the procedure were thoroughly explained, informed     consent was obtained.  No rectal exam performed. The LB [id number]     [id number] endoscope was introduced through the anus and advanced to     the sigmoid colon, without limitations.  The quality of the prep     was Unprepped.  The instrument was then slowly withdrawn as the     mucosa was fully examined.     <<PROCEDUREIMAGES>>           There was solid stool in rectum, sigmoid colon. This limited view     greatly. The underlying colonic mucosa in rectum appeared slightly     erythematous without a clear, distinct transition to normal mucosa     in sigmoid colon (recent enema effect?). Biopsies were taken from     rectum and sigmoid to check for colitis, CMV (jar 1). There was a     single, small polyp in distal rectum (sessile, 32mm across). This     was removed with cold snare and sent to pathology (jar 2) (see     image1, image3, image4, and image5).   Retroflexed views in  the     rectum revealed no abnormalities.    The scope was then withdrawn     from the patient and the procedure terminated.           COMPLICATIONS:  None           ENDOSCOPIC IMPRESSION:     1) Diminutive polyp in rectum, removed and sent to pathology     2) Mild erythema in distal rectum without clear transition to     normal colonic mucosa.  Prep effect from pre-procedure enema?     Biopsies taken and sent to pathology to check for chronic colitis,     CMV.     3) NOTE THAT THIS EXAM WAS LIMITED BY LARGE AMOUNT OF RETAINED     SOLID STOOL. He  was previously have chronic diarrhea, multiple     stools a day and it was felt that he would not need any prep for a     colonoscopy.  His diarrhea ceased 2 days ago, however and so he     was given pre-procedure enema.           RECOMMENDATIONS:     1) Await pathology.           ______________________________     Milus Banister, MD           n.     eSIGNED:   Milus Banister at 02/03/2010 10:38 AM           [name], [id number]  Note: An exclamation mark (!) indicates a result that was not dispersed into the flowsheet. Document Creation Date: 02/03/2010 10:39 AM _______________________________________________________________________  (1) Order result status: Final Collection or observation date-time: 02/03/2010 10:26 Requested date-time:  Receipt date-time:  Reported date-time:  Referring Physician:   Ordering Physician: Owens Loffler [phone]) Specimen Source:  Source: Tawanna Cooler Order Number: [id number] Lab site:

## 2010-11-13 NOTE — Procedures (Signed)
Summary: Upper Endoscopy  Patient: [name] Note: All result statuses are Final unless otherwise noted.  Tests: (1) Upper Endoscopy (EGD)   EGD Upper Endoscopy       [facility] [employer] [facility] [employer].     [place],   [id number]           ENDOSCOPY PROCEDURE REPORT           PATIENT:  [name] [name], [name] [name]  MR#:  [id number]     BIRTHDATE:  [birth year], 44 yrs. old  GENDER:  male     ENDOSCOPIST:  Milus Banister, MD     PROCEDURE DATE:  02/03/2010     PROCEDURE:  EGD, diagnostic     ASA CLASS:  Class III     INDICATIONS:  chronic intermittent nausea and vomitting     MEDICATIONS:  There was residual sedation effect present from     prior procedure., Versed 4 mg IV     TOPICAL ANESTHETIC:  none           DESCRIPTION OF PROCEDURE:   After the risks benefits and     alternatives of the procedure were thoroughly explained, informed     consent was obtained.  The LB GIF-H180 [id number] endoscope was     introduced through the mouth and advanced to the pylorus, limited     by patient discomfort, retching and gagging, sedation problems.     He was unable to adequately sedated, demanded very clearly during     the procedure that we "stop now." The instrument was slowly     withdrawn as the mucosa was fully examined.           <<PROCEDUREIMAGES>>           There was a large amount of retained solid food in stomach.     Procedure was aborted before duodenum could be examined or     biopsies were able to be taken (see image2 and image3).     Retroflexed views revealed not done.    The scope was then withdrawn     from the patient and the procedure completed.           COMPLICATIONS:  None           ENDOSCOPIC IMPRESSION:     1) Limited evalauation due to inability to adequately sedate     (see above)     2) Large amount of liquid, solid food in stomach suggests     underlying gastroparesis.           RECOMMENDATIONS:     Dr. Ardis Hughs will communicate these findings  with Dr. Carlean Purl. If     needed, repeat EGD +/- colonoscopy could be reattempted with full     prep as well as anesthesia assistence with propofol.     For now, he will change diet so that he eats 4-5 small meals a day     rather than fewer number, larger meals.           ______________________________     Milus Banister, MD           cc: Drs. Jana Half           n.     eSIGNED:   Milus Banister at 02/03/2010 10:46 AM           [name], [id number]  Note: An exclamation mark (!) indicates  a result that was not dispersed into the flowsheet. Document Creation Date: 02/03/2010 10:47 AM _______________________________________________________________________  (1) Order result status: Final Collection or observation date-time: 02/03/2010 10:35 Requested date-time:  Receipt date-time:  Reported date-time:  Referring Physician:   Ordering Physician: Owens Loffler [phone]) Specimen Source:  Source: Tawanna Cooler Order Number: [id number] Lab site:

## 2010-11-13 NOTE — Letter (Signed)
Summary: [facility]   Imported By: Marilynne Drivers 09/19/2010 08:57:53  _____________________________________________________________________  External Attachment:    Type:   Image     Comment:   External Document

## 2010-11-13 NOTE — Letter (Signed)
Summary: LMN CPAP and humidifier suppllies/[facility]  LMN CPAP and humidifier suppllies/[facility]   Imported By: Bubba Hales 07/06/2008 10:04:11  _____________________________________________________________________  External Attachment:    Type:   Image     Comment:   External Document

## 2010-11-14 NOTE — Letter (Signed)
Summary: [facility]   Imported By: Marilynne Drivers 05/28/2010 10:59:17  _____________________________________________________________________  External Attachment:    Type:   Image     Comment:   External Document

## 2010-12-30 LAB — GLUCOSE, CAPILLARY
Glucose-Capillary: 241 mg/dL — ABNORMAL HIGH (ref 70–99)
Glucose-Capillary: 289 mg/dL — ABNORMAL HIGH (ref 70–99)

## 2011-01-02 LAB — DIFFERENTIAL
Basophils Absolute: 0 10*3/uL (ref 0.0–0.1)
Basophils Absolute: 0 10*3/uL (ref 0.0–0.1)
Basophils Absolute: 0 10*3/uL (ref 0.0–0.1)
Basophils Relative: 0 % (ref 0–1)
Basophils Relative: 0 % (ref 0–1)
Basophils Relative: 0 % (ref 0–1)
Eosinophils Absolute: 0 10*3/uL (ref 0.0–0.7)
Eosinophils Absolute: 0.1 10*3/uL (ref 0.0–0.7)
Eosinophils Absolute: 0.1 10*3/uL (ref 0.0–0.7)
Eosinophils Relative: 1 % (ref 0–5)
Eosinophils Relative: 1 % (ref 0–5)
Eosinophils Relative: 1 % (ref 0–5)
Lymphocytes Relative: 8 % — ABNORMAL LOW (ref 12–46)
Lymphocytes Relative: 8 % — ABNORMAL LOW (ref 12–46)
Lymphocytes Relative: 9 % — ABNORMAL LOW (ref 12–46)
Lymphs Abs: 0.6 10*3/uL — ABNORMAL LOW (ref 0.7–4.0)
Lymphs Abs: 0.8 10*3/uL (ref 0.7–4.0)
Lymphs Abs: 0.9 10*3/uL (ref 0.7–4.0)
Monocytes Absolute: 0.4 10*3/uL (ref 0.1–1.0)
Monocytes Absolute: 0.4 10*3/uL (ref 0.1–1.0)
Monocytes Absolute: 0.5 10*3/uL (ref 0.1–1.0)
Monocytes Relative: 4 % (ref 3–12)
Monocytes Relative: 5 % (ref 3–12)
Monocytes Relative: 6 % (ref 3–12)
Neutro Abs: 6.8 10*3/uL (ref 1.7–7.7)
Neutro Abs: 7.6 10*3/uL (ref 1.7–7.7)
Neutro Abs: 9.7 10*3/uL — ABNORMAL HIGH (ref 1.7–7.7)
Neutrophils Relative %: 85 % — ABNORMAL HIGH (ref 43–77)
Neutrophils Relative %: 86 % — ABNORMAL HIGH (ref 43–77)
Neutrophils Relative %: 88 % — ABNORMAL HIGH (ref 43–77)

## 2011-01-02 LAB — GLUCOSE, CAPILLARY
Glucose-Capillary: 112 mg/dL — ABNORMAL HIGH (ref 70–99)
Glucose-Capillary: 122 mg/dL — ABNORMAL HIGH (ref 70–99)
Glucose-Capillary: 122 mg/dL — ABNORMAL HIGH (ref 70–99)
Glucose-Capillary: 136 mg/dL — ABNORMAL HIGH (ref 70–99)
Glucose-Capillary: 137 mg/dL — ABNORMAL HIGH (ref 70–99)
Glucose-Capillary: 144 mg/dL — ABNORMAL HIGH (ref 70–99)
Glucose-Capillary: 174 mg/dL — ABNORMAL HIGH (ref 70–99)
Glucose-Capillary: 180 mg/dL — ABNORMAL HIGH (ref 70–99)
Glucose-Capillary: 182 mg/dL — ABNORMAL HIGH (ref 70–99)
Glucose-Capillary: 264 mg/dL — ABNORMAL HIGH (ref 70–99)
Glucose-Capillary: 351 mg/dL — ABNORMAL HIGH (ref 70–99)
Glucose-Capillary: 378 mg/dL — ABNORMAL HIGH (ref 70–99)
Glucose-Capillary: 97 mg/dL (ref 70–99)

## 2011-01-02 LAB — COMPREHENSIVE METABOLIC PANEL
ALT: 23 U/L (ref 0–53)
AST: 25 U/L (ref 0–37)
Albumin: 3.1 g/dL — ABNORMAL LOW (ref 3.5–5.2)
Alkaline Phosphatase: 77 U/L (ref 39–117)
BUN: 37 mg/dL — ABNORMAL HIGH (ref 6–23)
CO2: 25 mEq/L (ref 19–32)
Calcium: 8 mg/dL — ABNORMAL LOW (ref 8.4–10.5)
Chloride: 100 mEq/L (ref 96–112)
Creatinine, Ser: 2.23 mg/dL — ABNORMAL HIGH (ref 0.4–1.5)
GFR calc Af Amer: 39 mL/min — ABNORMAL LOW (ref >60)
GFR calc non Af Amer: 32 mL/min — ABNORMAL LOW (ref >60)
Glucose, Bld: 214 mg/dL — ABNORMAL HIGH (ref 70–99)
Potassium: 3.6 mEq/L (ref 3.5–5.1)
Sodium: 137 mEq/L (ref 135–145)
Total Bilirubin: 0.7 mg/dL (ref 0.3–1.2)
Total Protein: 6 g/dL (ref 6.0–8.3)

## 2011-01-02 LAB — BASIC METABOLIC PANEL
BUN: 39 mg/dL — ABNORMAL HIGH (ref 6–23)
CO2: 28 mEq/L (ref 19–32)
Calcium: 8.5 mg/dL (ref 8.4–10.5)
Chloride: 107 mEq/L (ref 96–112)
Creatinine, Ser: 2.2 mg/dL — ABNORMAL HIGH (ref 0.4–1.5)
GFR calc Af Amer: 39 mL/min — ABNORMAL LOW (ref >60)
GFR calc non Af Amer: 32 mL/min — ABNORMAL LOW (ref >60)
Glucose, Bld: 77 mg/dL (ref 70–99)
Potassium: 3.5 mEq/L (ref 3.5–5.1)
Sodium: 144 mEq/L (ref 135–145)

## 2011-01-02 LAB — STOOL CULTURE

## 2011-01-02 LAB — URINALYSIS, ROUTINE W REFLEX MICROSCOPIC
Bilirubin Urine: NEGATIVE
Glucose, UA: NEGATIVE mg/dL
Hgb urine dipstick: NEGATIVE
Ketones, ur: NEGATIVE mg/dL
Nitrite: NEGATIVE
Protein, ur: NEGATIVE mg/dL
Specific Gravity, Urine: 1.009 (ref 1.005–1.030)
Urobilinogen, UA: 0.2 mg/dL (ref 0.0–1.0)
pH: 5 (ref 5.0–8.0)

## 2011-01-02 LAB — CBC
HCT: 37.8 % — ABNORMAL LOW (ref 39.0–52.0)
HCT: 38.9 % — ABNORMAL LOW (ref 39.0–52.0)
HCT: 41.2 % (ref 39.0–52.0)
Hemoglobin: 12.4 g/dL — ABNORMAL LOW (ref 13.0–17.0)
Hemoglobin: 12.6 g/dL — ABNORMAL LOW (ref 13.0–17.0)
Hemoglobin: 13.4 g/dL (ref 13.0–17.0)
MCHC: 32.5 g/dL (ref 30.0–36.0)
MCHC: 32.5 g/dL (ref 30.0–36.0)
MCHC: 32.7 g/dL (ref 30.0–36.0)
MCV: 82.3 fL (ref 78.0–100.0)
MCV: 82.8 fL (ref 78.0–100.0)
MCV: 83 fL (ref 78.0–100.0)
Platelets: 257 10*3/uL (ref 150–400)
Platelets: 271 10*3/uL (ref 150–400)
Platelets: 313 10*3/uL (ref 150–400)
RBC: 4.59 MIL/uL (ref 4.22–5.81)
RBC: 4.7 MIL/uL (ref 4.22–5.81)
RBC: 4.96 MIL/uL (ref 4.22–5.81)
RDW: 15.1 % (ref 11.5–15.5)
RDW: 15.2 % (ref 11.5–15.5)
RDW: 15.3 % (ref 11.5–15.5)
WBC: 11.1 10*3/uL — ABNORMAL HIGH (ref 4.0–10.5)
WBC: 7.9 10*3/uL (ref 4.0–10.5)
WBC: 8.9 10*3/uL (ref 4.0–10.5)

## 2011-01-02 LAB — HEPATIC FUNCTION PANEL
ALT: 28 U/L (ref 0–53)
AST: 32 U/L (ref 0–37)
Albumin: 3.6 g/dL (ref 3.5–5.2)
Alkaline Phosphatase: 75 U/L (ref 39–117)
Bilirubin, Direct: 0.2 mg/dL (ref 0.0–0.3)
Indirect Bilirubin: 0.7 mg/dL (ref 0.3–0.9)
Total Bilirubin: 0.9 mg/dL (ref 0.3–1.2)
Total Protein: 6.7 g/dL (ref 6.0–8.3)

## 2011-01-02 LAB — FECAL LACTOFERRIN, QUANT: Fecal Lactoferrin: POSITIVE

## 2011-01-02 LAB — POCT I-STAT, CHEM 8
BUN: 39 mg/dL — ABNORMAL HIGH (ref 6–23)
Calcium, Ion: 1.08 mmol/L — ABNORMAL LOW (ref 1.12–1.32)
Chloride: 108 mEq/L (ref 96–112)
Creatinine, Ser: 2.3 mg/dL — ABNORMAL HIGH (ref 0.4–1.5)
Glucose, Bld: 112 mg/dL — ABNORMAL HIGH (ref 70–99)
HCT: 42 % (ref 39.0–52.0)
Hemoglobin: 14.3 g/dL (ref 13.0–17.0)
Potassium: 3.9 mEq/L (ref 3.5–5.1)
Sodium: 141 mEq/L (ref 135–145)
TCO2: 27 mmol/L (ref 0–100)

## 2011-01-02 LAB — RENAL FUNCTION PANEL
Albumin: 3.2 g/dL — ABNORMAL LOW (ref 3.5–5.2)
BUN: 38 mg/dL — ABNORMAL HIGH (ref 6–23)
CO2: 27 mEq/L (ref 19–32)
Calcium: 8.3 mg/dL — ABNORMAL LOW (ref 8.4–10.5)
Chloride: 105 mEq/L (ref 96–112)
Creatinine, Ser: 2.22 mg/dL — ABNORMAL HIGH (ref 0.4–1.5)
GFR calc Af Amer: 39 mL/min — ABNORMAL LOW (ref >60)
GFR calc non Af Amer: 32 mL/min — ABNORMAL LOW (ref >60)
Glucose, Bld: 117 mg/dL — ABNORMAL HIGH (ref 70–99)
Phosphorus: 3.6 mg/dL (ref 2.3–4.6)
Potassium: 3.8 mEq/L (ref 3.5–5.1)
Sodium: 141 mEq/L (ref 135–145)

## 2011-01-02 LAB — CLOSTRIDIUM DIFFICILE EIA: C difficile Toxins A+B, EIA: NEGATIVE

## 2011-01-02 LAB — HEMOGLOBIN A1C
Hgb A1c MFr Bld: 8.7 % — ABNORMAL HIGH (ref 4.6–6.1)
Mean Plasma Glucose: 203 mg/dL

## 2011-01-02 LAB — BRAIN NATRIURETIC PEPTIDE: Pro B Natriuretic peptide (BNP): 154 pg/mL — ABNORMAL HIGH (ref 0.0–100.0)

## 2011-01-20 LAB — BASIC METABOLIC PANEL
BUN: 32 mg/dL — ABNORMAL HIGH (ref 6–23)
CO2: 26 mEq/L (ref 19–32)
Calcium: 7.8 mg/dL — ABNORMAL LOW (ref 8.4–10.5)
Chloride: 103 mEq/L (ref 96–112)
Creatinine, Ser: 1.77 mg/dL — ABNORMAL HIGH (ref 0.4–1.5)
GFR calc Af Amer: 51 mL/min — ABNORMAL LOW (ref >60)
GFR calc non Af Amer: 42 mL/min — ABNORMAL LOW (ref >60)
Glucose, Bld: 202 mg/dL — ABNORMAL HIGH (ref 70–99)
Potassium: 3.5 mEq/L (ref 3.5–5.1)
Sodium: 140 mEq/L (ref 135–145)

## 2011-01-20 LAB — GLUCOSE, CAPILLARY
Glucose-Capillary: 145 mg/dL — ABNORMAL HIGH (ref 70–99)
Glucose-Capillary: 174 mg/dL — ABNORMAL HIGH (ref 70–99)
Glucose-Capillary: 234 mg/dL — ABNORMAL HIGH (ref 70–99)

## 2011-01-20 LAB — CBC
HCT: 35.2 % — ABNORMAL LOW (ref 39.0–52.0)
Hemoglobin: 11.6 g/dL — ABNORMAL LOW (ref 13.0–17.0)
MCHC: 32.8 g/dL (ref 30.0–36.0)
MCV: 86.2 fL (ref 78.0–100.0)
Platelets: 275 10*3/uL (ref 150–400)
RBC: 4.08 MIL/uL — ABNORMAL LOW (ref 4.22–5.81)
RDW: 14.7 % (ref 11.5–15.5)
WBC: 5.7 10*3/uL (ref 4.0–10.5)

## 2011-01-27 ENCOUNTER — Encounter (HOSPITAL_BASED_OUTPATIENT_CLINIC_OR_DEPARTMENT_OTHER): Payer: Medicare Other | Attending: General Surgery

## 2011-01-27 DIAGNOSIS — Z940 Kidney transplant status: Secondary | ICD-10-CM | POA: Insufficient documentation

## 2011-01-27 DIAGNOSIS — E119 Type 2 diabetes mellitus without complications: Secondary | ICD-10-CM | POA: Insufficient documentation

## 2011-01-27 DIAGNOSIS — G589 Mononeuropathy, unspecified: Secondary | ICD-10-CM | POA: Insufficient documentation

## 2011-01-27 DIAGNOSIS — L989 Disorder of the skin and subcutaneous tissue, unspecified: Secondary | ICD-10-CM | POA: Insufficient documentation

## 2011-01-27 DIAGNOSIS — R609 Edema, unspecified: Secondary | ICD-10-CM | POA: Insufficient documentation

## 2011-01-27 DIAGNOSIS — Z79899 Other long term (current) drug therapy: Secondary | ICD-10-CM | POA: Insufficient documentation

## 2011-01-27 DIAGNOSIS — Z794 Long term (current) use of insulin: Secondary | ICD-10-CM | POA: Insufficient documentation

## 2011-02-05 ENCOUNTER — Encounter (INDEPENDENT_AMBULATORY_CARE_PROVIDER_SITE_OTHER): Payer: Medicare Other

## 2011-02-05 DIAGNOSIS — M79606 Pain in leg, unspecified: Secondary | ICD-10-CM

## 2011-02-05 DIAGNOSIS — L97109 Non-pressure chronic ulcer of unspecified thigh with unspecified severity: Secondary | ICD-10-CM

## 2011-02-05 HISTORY — DX: Pain in leg, unspecified: M79.606

## 2011-02-10 ENCOUNTER — Encounter (HOSPITAL_BASED_OUTPATIENT_CLINIC_OR_DEPARTMENT_OTHER): Payer: Medicare Other | Attending: General Surgery

## 2011-02-10 DIAGNOSIS — Z940 Kidney transplant status: Secondary | ICD-10-CM | POA: Insufficient documentation

## 2011-02-10 DIAGNOSIS — L989 Disorder of the skin and subcutaneous tissue, unspecified: Secondary | ICD-10-CM | POA: Insufficient documentation

## 2011-02-10 DIAGNOSIS — G589 Mononeuropathy, unspecified: Secondary | ICD-10-CM | POA: Insufficient documentation

## 2011-02-10 DIAGNOSIS — E119 Type 2 diabetes mellitus without complications: Secondary | ICD-10-CM | POA: Insufficient documentation

## 2011-02-10 DIAGNOSIS — R609 Edema, unspecified: Secondary | ICD-10-CM | POA: Insufficient documentation

## 2011-02-10 DIAGNOSIS — Z794 Long term (current) use of insulin: Secondary | ICD-10-CM | POA: Insufficient documentation

## 2011-02-10 DIAGNOSIS — Z79899 Other long term (current) drug therapy: Secondary | ICD-10-CM | POA: Insufficient documentation

## 2011-02-12 ENCOUNTER — Encounter: Payer: Self-pay | Admitting: Internal Medicine

## 2011-02-24 NOTE — Discharge Summary (Signed)
NAMEIHAN, MCWHIRTER NO.:  [id number]   MEDICAL RECORD NO.:  [id number]          PATIENT TYPE:  INP   LOCATION:  [id number]                         FACILITY:  [facility]   PHYSICIAN:  Satira Sark, MD DATE OF BIRTH:  May 02, 1966   DATE OF ADMISSION:  12/11/2007  DATE OF DISCHARGE:  12/13/2007                               DISCHARGE SUMMARY   PRIMARY CARDIOLOGIST:  Satira Sark, M.D.   PRIMARY NEPHROLOGIST:  Elzie Rings. Lorrene Reid, M.D.   PRIMARY ENDOCRINOLOGIST:  Juanda Bond. Altheimer, M.D.   DISCHARGE DIAGNOSES:  1. Moderate pericardial effusion.  2. End-stage renal disease on dialysis.  3. Type 2 diabetes mellitus.  4. History of posttraumatic stress disorder.  5. History of allergic rhinitis.  6. History of remote pneumonia.  7. Acute bronchitis.  8. Hyperlipidemia.  9. Hypertension.   ALLERGIES:  PENICILLIN causes a rash, ACE INHIBITORS cause hypotension  and cough, NSAIDS cause GI upset.   PROCEDURE:  Two-dimensional echocardiogram, CT angio of the chest.   HISTORY OF PRESENT ILLNESS:  A 45 year old male with the above problem  list.  He presented to the [facility] emergency department on  December 11, 2007, with complaints of chest pain and shortness of breath.  The pain was worse with deep breathing and with some movements.  His EKG  in the emergency room showed LVH with T wave inversions laterally as  well as some early repolarization anteriorly.  It was not felt to be  significantly changed from old EKG in September of 2006.  Decision was  made to admit him for further evaluation and rule out.   HOSPITAL COURSE:  The patient ruled out for MI by cardiac markers.  Given his history of end-stage renal disease on dialysis, nephrology was  consulted to maintain renal care while he is hospitalized.  He continued  to have intermittent pleuritic chest discomfort prompting two-  dimensional echocardiogram which was performed March 3, revealing a  moderate pericardial effusion which was circumferential to the heart  with a larger collection posteriorly.  There was no clear evidence of  tamponade.  EF was 60-65%.  It was felt that pericarditis with  subsequent pericardial effusion was likely the culprit of his  discomfort, however, he was also noted to have an elevated D-dimer of  2.22.  CT angiogram of the chest was performed March 3, and this  revealed no evidence of pulmonary embolism with pericardial effusion as  noted above and with mild basilar atelectasis.  We have decided to  initiate Indocin therapy on the patient along with PPI given his GI  intolerance to NSAIDS in the past.  We will plan on follow-up two-  dimensional echocardiogram in approximately one week to 10 days and he  will follow up with Dr. Domenic Polite in two weeks.   DISCHARGE LABORATORY DATA:  Hemoglobin 11.7, hematocrit 35.1, WBC 5.1,  platelets 260.  D-dimer 2.22, INR 1.1, sodium 136, potassium 3.7,  chloride 99, CO2 29, BUN 28, creatinine 5.38, glucose 216, albumin 3.3,  calcium 8.7, phosphorus 3.3, magnesium 2.4, CK 62, MB 3.4, troponin I  0.06.  BNP 243.0, total cholesterol 121, triglycerides 175, HDL 31, LDL  55.   Blood cultures showed no growth.   DISPOSITION:  The patient is being discharged home today in good  condition.   FOLLOWUP:  We will arrange for follow-up two-dimensional echocardiogram  within a week to 10 days as well as to follow up with Dr. Domenic Polite in  approximately two weeks.   DISCHARGE MEDICATIONS:  1. Avelox 400 mg daily.  2. Indocin ER 75 mg daily x10 days.  3. Protonix 40 mg daily x10 days.  4. Simvastatin 20 mg q.h.s.  5. Norvasc 10 mg daily.  6. Fexofenadine 60 mg b.i.d.  7. Humalog 26 units t.i.d.  8. Levemir 70 units b.i.d.  9. Clonazepam 0.5 mg q.h.s. p.r.n.  10.Renal vitamin one daily.  11.Renagel 800 mg t.i.d. with meals.  12.Fosrenol 1000 mg t.i.d.   OUTSTANDING LAB STUDIES:  None.   DURATION DISCHARGE  ENCOUNTER:  65 minutes including physician time.      Murray Hodgkins, ANP      Satira Sark, MD  Electronically Signed    CB/MEDQ  D:  12/13/2007  T:  12/14/2007  Job:  [id number]   cc:   Elzie Rings. Lorrene Reid, M.D.

## 2011-02-24 NOTE — H&P (Signed)
[name], [name] NO.:  [id number]   MEDICAL RECORD NO.:  [id number]          PATIENT TYPE:  INP   LOCATION:  1831                         FACILITY:  [facility]   PHYSICIAN:  Shaune Pascal. Bensimhon, MDDATE OF BIRTH:  Mar 10, 1966   DATE OF ADMISSION:  12/11/2007  DATE OF DISCHARGE:                              HISTORY & PHYSICAL   The patient is Full Code.   CARDIOLOGIST:  Dr. Domenic Polite.   PRIMARY CARE PHYSICIAN:  Primarily through [place], Dr. Jamal Maes.   ENDOCRINOLOGIST:  Dr. Elyse Hsu.   TOTAL VISIT TIME:  Approximately 50 minutes.   CHIEF COMPLAINT:  Shortness of breath and chest pain.  He came in coded  as a STEMI.   HISTORY OF PRESENT ILLNESS:  [name] is a 45 year old male with a  history of end-stage renal disease on hemodialysis the last 3 years,  last hemodialysis was on Friday, he has a history of left AV fistula  placement for this, history of diabetes on insulin with presumed end-  stage renal disease from the diabetes, history of hypertension, history  of hyperlipidemia, who noted shortness of breath and chest pain that  woke him up at 3 p.m. on the day of admission, December 11, 2007.  The  patient notes the symptom that primarily woke him up was the severe  shortness of breath.  The patient's notes describe the chest pain as  sharp, involving the entire thorax, anterior and posterior left and  right, severely worse with inspiration and with movement.  He denies any  cough that is worse than usual.  He notes fevers and sweats today,  however.  There is no nausea on radiation to the chest discomfort.  Patient called 911 and was given 324 of aspirin by EMS and sublingual  nitroglycerin x2.  The patient in the ER had 9/10 chest pain with  inspiration, temperature was 100.9.  The patient notes he had a flu shot  2 months ago, he denies any sick contacts.   PAST MEDICAL HISTORY AND PAST SURGICAL HISTORY:  1. History of diabetes, as  above.  2. History of posttraumatic stress disorder.  3. History of AV fistula placement in the left arm 3 years ago.  4. History of allergic rhinitis.  5. History of pneumonia in the past.   ALLERGIES:  1. HE IS ALLERGIC TO PENICILLIN WHICH CAUSES A RASH.  2. HE IS ALSO ALLERGIC TO ACE INHIBITORS THAT CAUSE DECREASED BLOOD      PRESSURE AND A COUGH.  3. NSAIDS CAUSE GI UPSET.   MEDICATIONS:  1. Simvastatin 20 mg daily.  2. Amlodipine 10 mg at bedtime.  3. Klonopin 0.5 mg at bedtime.  4. Fexofenadine 60 mg twice a day.  5. Humalog 20 units three times a day.  6. Levemir 17 units in the morning and 17 units in the evening.   SOCIAL HISTORY:  1. He denies ever using any tobacco, alcohol or illicit drugs.  2. He is a retired IT consultant.  3. He is married, no children.   FAMILY HISTORY:  1. No early coronary disease  in the family.  2. Father had diabetes.  3. Mother with hypertension.   REVIEW OF SYSTEMS:  A 14-point review of systems negative unless stated  above, except he did have some constipation.   PHYSICAL EXAMINATION:  Temperature is 100.9 with pulse of 120,  respiratory rate of 14, blood pressure 125/64, sats 97% on room air.  GENERAL:  He is an obese male lying flat in bed, taking very shallow  breaths to prevent the pain.  HEENT:  Normocephalic, atraumatic.  His pupils are equal, round and  reactive to light, extraocular movements are intact, the oropharynx is  dry.  NECK:  Supple with no lymphadenopathy or thyromegaly, no jugular venous  distention although his body habitus makes this difficult to assess.  SKIN:  Shows mild clamminess.  LUNGS:  Show bilateral coarse breath sounds.  CARDIOVASCULAR EXAM:  Regular rhythm, fast rate, distant.  ABDOMEN:  Obese, soft, nontender, nondistended with no  hepatosplenomegaly.  EXTREMITIES:  No clubbing, cyanosis, there is trace lower extremity  edema.  NEUROLOGIC EXAM:  Discloses decreased sensation to the legs  bilaterally.   LABORATORY DATA:  On venous blood gas he has a pH of 7.38, CO2 37.4,  bicarb 22.1, white count of 15.1, hematocrit of 37.4, hemoglobin 12.6,  platelets 289,000, segs of 86.  Sodium 135, potassium 4.6, chloride 105,  BUN 73.  The creatinine is 12 with a glucose of 276.  The patient's  cardiac markers at 2249 showed a troponin of less than 0.05, MB 2.3,  myoglobin greater than 500, INR 1.1, PTT 29, PT 14.1.  Chest x-ray  showed cardiomegaly with a wide mediastinum and possible cephalization,  it cannot rule out a left-sided infiltrate.  Electrocardiogram showed  sinus tachycardia at a vent rate of 124, normal axis, PR interval is  142, QRS 83, QT corrected 462 msec.  There was LVH with repolarization  abnormalities including T wave inversions laterally.  He had some early  repolarization anteriorly as well.  No major change from  electrocardiogram June 12, 2005.   ASSESSMENT AND PLAN:  This is a patient with a history of significant  cardiac risk factors who presents with chest pain.  The abnormal  electrocardiogram was concerning for ST elevation MI.  Patient on  electrocardiogram could also be consistent with left ventricular  hypertrophy.  His cardiac markers are unremarkable here.  He will be  admitted for rule out acute coronary syndrome and possible early  pneumonia vs. Bronchitis.  1. For his possible bronchitis versus early pneumonia will get an      influenza swab and place him on Avelox and azithromycin, he will be      on nebulizers, oxygen and will get sputum cultures and blood      cultures.  2. For his chest pain, rule out acute coronary syndrome.  Will get      cardiac markers times 3 and serial electrocardiograms and he will      be on telemetry.  He will be on beta blockade.  He is ALLERGIC TO      ACE INHIBITORS, so we will not give that.  Will keep him nothing by      mouth for possible assessment of his coronaries if need be.  3. For his diabetes,  elevated blood sugars.  Will follow closely and      try to control with additional insulin.  4. For his hypovolemia, will give IV fluids.  5. For his cough, will give Mucinex.  6. For his possible cephalization on chest x-ray, will check a BNP and      an echocardiogram.  Will also check an echocardiogram to rule out      pericardial effusion.  7. Prophylaxis.  GI prophylaxis with Zantac, deep vein thrombosis      prophylaxis with subcutaneous heparin.      Darryl D. Prime, MD   Electronically Signed     ______________________________  Shaune Pascal. Bensimhon, MD    DDP/MEDQ  D:  12/12/2007  T:  12/12/2007  Job:  [id number]

## 2011-02-24 NOTE — Op Note (Signed)
NAMETACOMA, DRUMM              ACCOUNT NO.:  [id number]   MEDICAL RECORD NO.:  [id number]          PATIENT TYPE:  AMB   LOCATION:  SDS                          FACILITY:  [facility]   PHYSICIAN:  Nelda Severe. Kellie Simmering, M.D.  DATE OF BIRTH:  12/10/1965   DATE OF PROCEDURE:  DATE OF DISCHARGE:  01/19/2008                               OPERATIVE REPORT   PREOPERATIVE DIAGNOSIS:  End-stage renal disease.   POSTOPERATIVE DIAGNOSIS:  End-stage renal disease.   OPERATION:  Creation of the right upper arm brachial artery to cephalic  vein AV fistula (Kauffman shunt).   SURGEON:  Nelda Severe. Kellie Simmering, M.D.   FIRST ASSISTANT:  Nurse.   ANESTHESIA:  Local.   PROCEDURE:  The patient was taken to the operating room, placed in the  supine position at which time the right upper extremity was prepped with  Betadine scrub and solution and draped in routine sterile manner.  After  infiltration with 1% Xylocaine, transverse incision was made in the  antecubital area.  Antecubital vein was dissected free.  Basilic branch  was not identified, was small to absent.  The cephalic branch was about  3 mm in size and was adequate by vein mapping.  It was ligated distally,  transected, and gently dilated with heparinized saline, seemed to be an  adequate vein, was explored with 3-Fogarty catheter and was patent up to  the central  veins.  Artery was exposed beneath the fascia, encircled  with vessel loops.  It had an excellent pulse, but did have some mild  calcification.  No heparin was given.  Artery was occluded proximally  and distally with vessel loops, opened with 15 blade extended with Potts  scissors.  Vein was carefully measured and spatulated, anastomosed end-  to-side with 6-0 Prolene.  Vessel loops were released.  There was an  excellent pulse and palpable thrill in the fistula.  There was also  radial and ulnar flow in the wrist with the fistula open.  Wound was  irrigated with saline, closed in layers  with Vicryl in subcuticular  fashion.  Sterile dressing was applied.  The patient taken to recovery  room in satisfactory condition.      Nelda Severe Kellie Simmering, M.D.  Electronically Signed     JDL/MEDQ  D:  01/19/2008  T:  01/20/2008  Job:  [id number]

## 2011-02-24 NOTE — Consult Note (Signed)
NEW PATIENT CONSULTATION   [name] [name], [name] [name]  DOB:  October 19, 1965                                       01/17/2008  [id number]   Patient has a functioning left upper arm AV fistula with multiple  pseudoaneurysms and has had thrombolysis performed recently by the  radiologist on 03/17.  He has total occlusion of the cephalic vein  central to the pseudoaneurysms, and although this was opened, it  reoccluded.  He is referred for further vascular access.  He has never  had access in his right upper extremity, and he is right-handed.   PHYSICAL EXAMINATION:  Left arm has a functioning upper arm fistula with  two large pseudoaneurysms measuring about 3 cm in diameter, distal upper  arm.  There is a strong pulse and a weak thrill in the fistula.  There  is also a palpable radial pulse distally in the left upper arm.  Right  upper extremity has an excellent brachial and radial pulse at 3+, and  the cephalic vein appears good in the upper arm.   Vein mapping was performed, which reveals an excellent upper arm  cephalic vein, which tapers down to a reasonable size in the forearm.  On physical exam, the forearm cephalic vein is not very impressive.   I discussed the options with Dr. Lorrene Reid and the patient, and we have  decided to proceed with the right upper arm AV fistula on 04/09 at [facility] as an outpatient.  Hopefully, the left upper arm fistula will  remain functional long enough for the right upper arm fistula to mature.   Nelda Severe Kellie Simmering, M.D.  Electronically Signed   JDL/MEDQ  D:  01/17/2008  T:  01/18/2008  Job:  [id number]   cc:   Elzie Rings. Lorrene Reid, M.D.

## 2011-02-24 NOTE — Procedures (Signed)
CEPHALIC VEIN MAPPING   INDICATION:  End-stage renal disease.   HISTORY:  End-stage renal disease.   EXAM:  Right cephalic vein mapping.   The right cephalic vein is compressible.   Diameter measurements range from 0.37 to 0.47.   The left cephalic vein is not evaluated.   See attached worksheet for all measurements.   IMPRESSION:  Patent right cephalic vein which is of acceptable diameter  for use as a dialysis access site.   ___________________________________________  Nelda Severe. Kellie Simmering, M.D.   MG/MEDQ  D:  01/17/2008  T:  01/17/2008  Job:  [id number]

## 2011-02-27 NOTE — Discharge Summary (Signed)
[name] [name], [name] [name]                          ACCOUNT NO.:  [id number]   MEDICAL RECORD NO.:  [id number]                   PATIENT TYPE:  INP   LOCATION:  [id number]                                 FACILITY:  [facility]   PHYSICIAN:  Della Goo, MD                   DATE OF BIRTH:  [birth year]   DATE OF ADMISSION:  01/02/2004  DATE OF DISCHARGE:  01/05/2004                                 DISCHARGE SUMMARY   DISCHARGE DIAGNOSES:  1. Chest pain, noncardiac in origin.  Differential diagnosis includes     esophageal spasm versus gastroesophageal reflux disease.  2. Diabetes.  3. Hypertension.  4. Chronic renal insufficiency.  5. Anemia.   DISCHARGE MEDICATIONS:  1. Furosemide 20 mg p.o. daily.  2. Insulin 70/30--75 units q.a.m., 25 units q.p.m.  3. Aspirin 325 mg p.o. daily.  4. Lipitor 10 mg p.o. daily.  5. Atenolol 50 mg p.o. daily.  6. Protonix 40 mg p.o. daily.  7. Clarithromycin 500 mg p.o. b.i.d. times 2 weeks.  8. Metronidazole 500 mg p.o. b.i.d. times 2 weeks.  9. Altace 5 mg p.o. daily.  10.      Xanax 0.25 mg p.o. b.i.d.  11.      Tylenol p.r.n.   DISPOSITION:  Stable.   PROCEDURES PERFORMED:  On January 05, 2004, the patient had a stress  Cardiolite that was terminated when the desired heart rate could not be  achieved, and therefore, went for adenosine Cardiolite.  Results of the  adenosine Cardiolite show normal perfusion, left ventricular ejection  fraction 50%, no evidence of ischemia.  On March 26,2005, the patient had a  renal ultrasound that is still pending.  On March 24, the patient had  __________ that showed no evidence of acute disease, stable 2003.   CONSULTATIONS:  Dr. Dola Argyle.   ADMISSION HISTORY AND PHYSICAL EXAM:  Please see the patients' chart for  full details, but in brief, [name] [name] is a 45 year old male with a history  of diabetes, poorly controlled and chronic renal failure who, one week ago,  started having left-sided chest pain, back pain,  and left arm pain at rest,  no association with exertion.  The last episode was a few minutes prior to  interview, not relived with nitroglycerin, 7/10 at the worst, but 0/10 at  the time of this interview.  The pain is always relieved after 5 minutes  spontaneously.  Happens everyday.  No nausea and no vomiting, no sweating.   PHYSICAL EXAMINATION:  VITAL SIGNS:  Temperature 97.8.  Pulse 95.  Respiratory rate 18 . Blood pressure 163/87.  Oxygen saturation 99% on room  air.  GENERAL:  In general, he is in no apparent distress.  HEENT:  Eyes:  Pupils equally round and reactive to light and accommodation.  Extraocular movement intact.  ENT:  Tympanic membranes and oral mucosa  clear.  NECK:  Supple, no  lymphadenopathy.  RESPIRATORY:  Clear to auscultation bilaterally.  CV:  Regular rate and rhythm, no murmurs.  No chest wall tenderness to  palpation.  GI:  Soft and nontender.  Positive bowel sounds.  EXTREMITIES:  Trace edema bilaterally, 1+.  SKIN:  No rashes, no lesions.  NEURO:  No deficits.  Reflexes 2+ and symmetric in upper extremities and  lower extremities.  PSYCH:  Alert and pleasant and appropriate.   EKG shows normal sinus rhythm, no ischemic changes.  Borderline Q-T  interval.   Admission labs show sodium 140, potassium 4.3, chloride 111, CO2 of 21, BUN  42, creatinine 2.8, glucose 202, hemoglobin 9.9.  Point of care markers show  CK-MB of 13, troponin I of less than 0.05, myoglobin 334.  Cardiac enzymes  show CK of 409, CK-MB 12.5, troponin 0.02.  A second set shows CK of 291, CK-  MB 9.7, and troponin I of 0.01. Hemoglobin A1c is 11.7.   HOSPITAL COURSE:  1. Chest pain:  The differential diagnosis included angina versus reflux     disease versus esophageal spasm.  During the physical exam that I     performed, I thought I witnessed a muscle spasm during the patient's     complaint pain, but however, cannot entirely exclude angina.  Therefore,     Cardiology was  consulted.  Meanwhile, the patient was apparently treated     for muscle spasms with Flexeril during the hospitalization and given     Protonix for possible gastroesophageal reflux disease.  The patient was     seen by Cardiology who through that a stress would be adequate. As     mentioned before, a stress test was first attempted; however, it did not     achieve target heart rate and he was switched to adenosine Cardiolite     that showed normal results.  It was thought at the time of discharge, as     the Cardiolite was normal, the patient's chest pain was most likely     musculoskeletal since he did have some muscle spasm and it did seem to     radiate between his back and his arms, mostly where muscles are.     Therefore, it was thought the patient could benefit from simple over the     counter NSAID medication for his musculoskeletal pain.  2. Hypertension:  As noted, the patient has a history of hypertension.  For     this, the patient was treated with furosemide 20 mg p.o. q.d., atenolol     50 mg p.o. q.d. as well, and Altace 5 mg p.o. q.d.  The atenolol, of     course, being cardioprotective and the Altace being protective of the     kidneys as well.  3. Diabetes:  During the hospital course, the patient had elevated CPKs.  He     has a hemoglobin of 11.  Small titration was made of his insulin while he     was in the hospital going from 40 and 20 units of insulin to 45 and 25     units prior to discharge with the thought that his primary care physician     should continue titrating his insulin upwards to obtain adequate glucose     control.  4. Chronic renal failure:  This most likely is secondary to the patient's     kidney disease from diabetes and the treatment for which would be     adequate  glucose control.  5. Anemia:  The patient has hemoglobin of 9.9.  Iron studies were obtained    on this patient and he had a ferritin of 145 and iron of 55, haptoglobin     of 135 and  LDH of 181.  It was felt that his anemia was most likely     secondary to his chronic renal insufficiency.  Therefore, no further     evaluation was required.  A fecal occult blood test was obtained and it     was negative during this hospitalization.   DISCHARGE LABORATORY:  The patient did have a urine culture that was  positive for coag-negative Staph.  It was found to be, of course, resistant  to methicillin and ___________ for this the patient was started on  antibiotics of clarithromycin and metronidazole.   FOLLOW UP:  The patient will be followed in the outpatient clinic with an  appointment for followup.                                                Della Goo, MD    JH/MEDQ  D:  01/14/2004  T:  01/15/2004  Job:  [id number]   cc:   Dola Argyle, M.D.

## 2011-02-27 NOTE — Consult Note (Signed)
[name] [name], [name] [name] NO.:  [id number]   MEDICAL RECORD NO.:  [id number]          PATIENT TYPE:  INP   LOCATION:  [id number]                         FACILITY:  [facility]   PHYSICIAN:  Kirk Ruths, M.D. LHCDATE OF BIRTH:  1966-03-03   DATE OF CONSULTATION:  06/11/2005  DATE OF DISCHARGE:                                   CONSULTATION   PRIMARY CARE PHYSICIAN:  [facility].   CARDIOLOGIST:  Dola Argyle, M.D.   CONSULTING CARDIOLOGIST:  Kirk Ruths, M.D.   CHIEF COMPLAINT:  Chest pain.   HISTORY OF PRESENT ILLNESS:  The patient is a 45 year old Asian male with a  past medical history of diabetes mellitus, hypertension, increased  cholesterol, end-stage renal disease, post-traumatic stress disorder,  admitted with shortness of breath, increased blood pressure, and increased  potassium.  Cardiology was asked to evaluate the patient for evaluation of  chest pain to rule out MI.  The patient is lethargic at the time of history  and evaluation, and history was very difficult.  The patient apparently  presented to dialysis today with chest pain, systolic blood pressure greater  than 200.  The patient was sent to the ER, and the potassium was noted to be  7.  The patient was then sent immediately to dialysis, and cardiology was  asked to evaluate.  The patient does have some dyspnea now, but has no chest  pain.  The patient is unable to describe the pain.  The patient's previous  echo on 2003 showed a normal left ventricular function with mild RVE.  Cardiolite of January 05, 2004 showed an ejection fraction of 50%.  No  ischemia or infarction.   PAST MEDICAL HISTORY:  1.  End-stage renal disease.  The patient on dialysis.  2.  Diabetes mellitus.  3.  Hypertension.  4.  Hypercholesterolemia.  5.  Anemia.  6.  Post-traumatic stress disorder.  7.  The patient is legally blind.  8.  Gastroparesis.  9.  Bronchitis.   ALLERGIES:  1.  PENICILLIN.  2.  ACE  INHIBITOR (the patient gets cough).   CURRENT MEDICATIONS:  1.  Insulin 70/30.  2.  Humulin 30 units every a.m. and 25 units subcutaneous p.m.  3.  Zocor 20 mg nightly.  4.  Reglan 10 mg q.i.d.  5.  Nephro-Vite.  The patient denies taking this at present.   SOCIAL HISTORY:  The patient denies tobacco or alcohol abuse or substance  abuse of any kind.   FAMILY HISTORY/REVIEW OF SYSTEMS:  Not completed due to patient being  extremely lethargic.   PHYSICAL EXAMINATION:  VITAL SIGNS:  Blood pressure was 176/93 upon  presentation to the ED, 191/100, but has continued to decrease during  dialysis.  Pulse of 84, respirations 20, saturating 100% on 2 liters of  oxygen.  GENERAL:  The patient is lethargic, but will respond slowly to questions.  HEENT:  Pupils are equal, round, and reactive.  Sclerae are clear.  NECK:  Supple without lymphadenopathy, TMJ, bruit, or JVD.  Lymphadenopathy  - none.  CARDIOVASCULAR:  Regular rate and rhythm.  S1  and S2, without murmurs, rubs,  or gallops.  Pulses were 2+ and equal without bruits.  Radial 1+ in DPPTs.  LUNGS:  Clear to auscultation, but decreased breath sounds in the bases.  SKIN:  No rashes or lesions.  ABDOMEN:  Soft, nontender.  No rebound or guarding.  GU:  Deferred.  EXTREMITIES:  No clubbing, cyanosis, or edema.  No rashes, lesions, or  petechiae.  MUSCULOSKELETAL:  No joint deformity or effusions.  No spine or CVA  tenderness.  NEUROLOGIC:  The patient is lethargic.   CT of the head showed stable CT of the brain.  No active abnormalities.  Chest x-ray showed cardiomegaly.  No active disease.  EKG showed a normal  sinus rhythm with a rate of 98.   LABORATORY DATA:  A white blood cell count of 8.0, hemoglobin of 15.3,  hematocrit 45.0, platelet count of 298, sodium 132, potassium of 7.0,  chloride 106, BUN of 62, creatinine of 12.2, glucose of 197.  Point of care  markers are negative x2.  Myoglobin greater than 500.   ASSESSMENT AND  PLAN:  The etiology of the chest pain is unclear.  He is  somewhat lethargic.  We would treat with aspirin, statin, and add low-dose  Lopressor 12.5 mg b.i.d. and continue nitrates.  It appears that the blood  pressure and potassium should improve with dialysis.  Continue to cycle  cardiac enzymes q.8h. x3.  If negative, repeat stress Myoview once he is  more stable.     ______________________________  April Humphrey, NP    ______________________________  Kirk Ruths, M.D. [facility]    AH/MEDQ  D:  06/11/2005  T:  06/11/2005  Job:  [id number]

## 2011-02-27 NOTE — Discharge Summary (Signed)
NAMEGROVE, SAVKO              ACCOUNT NO.:  [id number]   MEDICAL RECORD NO.:  [id number]          PATIENT TYPE:  INP   LOCATION:  5501                         FACILITY:  [facility]   PHYSICIAN:  Donato Heinz, M.D.DATE OF BIRTH:  1966-01-11   DATE OF ADMISSION:  06/11/2005  DATE OF DISCHARGE:  06/13/2005                                 DISCHARGE SUMMARY   ADMISSION DIAGNOSES:  1.  Acute chest pain.  2.  Hyperkalemia.  3.  Uncontrolled hypertension.  4.  End-stage renal disease on chronic hemodialysis.  5.  Anemia of chronic disease.  6.  Insulin-dependent diabetes mellitus.   DISCHARGE DIAGNOSES:  1.  Acute chest pain resolved with negative cardiac workup.  2.  Mild congestive heart failure secondary to volume overload, improved      with aggressive dialysis.  3.  Uncontrolled hypertension, improved with excess fluid removal.  4.  Major anxiety, depression, and insomnia, now on Zoloft and p.r.n.      Restoril.   BRIEF HISTORY:  A 45 year old Middle-Eastern male with end-stage renal  disease on chronic hemodialysis Tuesday, Thursday, and Saturday at the  [facility], with a history of chest pain in May 2005 and a  negative adenosine Cardiolite, presents to the kidney center complaining of  left substernal chest pressure. The patient broke out in a sweat and was  suffering from dyspnea, but denied nausea and vomiting. The pain was  nonradiating and prior to this morning he was in his usual state of health.  He was transported via EMS to the emergency room and found to have a  systolic blood pressure of 200. His pain resolved after one sublingual  nitroglycerin in the ER. He was also found to have potassium 7.1 with  continued hypertension. He was admitted to rule out acute MI.   LABORATORY ON ADMISSION:  White count 8000, hemoglobin 14.0, platelet count  298,000. Sodium 132, potassium 7.1, chloride 106, glucose 197, BUN 62,  creatinine 12.2. CK 235, MB 8.2.  Cholesterol 176, triglycerides 167, HDL 29,  LDL 114.   EKG showed sinus rhythm with inverted T-waves in leads I and aVL. Chest x-  ray shows decrease in heart size, mild vascular congestion, interstitial  prominence stable, that was 96 kg.   HOSPITAL COURSE:  The patient was admitted, placed on his usual medications  and underwent emergency dialysis for his hyperkalemia and volume excess. He  was aggressively dialyzed with approximately 3.2 liters removed in the first  dialysis. His blood pressure dropped to a low of 93/46. His hyperkalemia was  corrected. He was started on an aspirin 81 mg daily along with a beta  blocker, Lopressor 12.5 mg b.i.d. Cardiology was consulted who felt that his  chest pain was not clearly of cardiac etiology. They strongly encouraged  blood pressure control and removal of excess fluid.  Serial cardiac enzymes  were negative for acute event. His blood pressure came down and at the time  of discharge was approximately 130/80.  His chest pain resolved with  controlled blood pressure and aggressive dialysis.  He remained stable in  telemetry  in sinus rhythm. An echocardiogram was ordered, but the report is  not on the chart at the time of this dictation.   The patient had major anxiety, depression, and complaints of insomnia. In  the past Paxil has made him very nervous according to the patient. Zoloft  was started at 25 mg daily and will be titrated up after two to three weeks  if tolerated. Restoril was given 15 mg q.h.s. p.r.n. for insomnia. He was  clinically stable at time of discharge. His dry weight was established at 92  kg.   DISCHARGE MEDICATIONS:  1.  Protonix 40 mg q.h.s.  2.  Zocor 20 mg daily.  3.  PhosLo 667 mg two with meals.  4.  70/30 insulin, 30 units in the morning and 25 units in the evening.  5.  Reglan 10 mg a.c. and h.s.  6.  Nephro-Vite vitamin one daily.  7.  Metoprolol 25 mg b.i.d.  8.  Restoril 15 mg q.h.s. p.r.n.  9.  Zoloft  25 mg q.a.m.      Nonah Mattes, P.A.    ______________________________  Donato Heinz, M.D.    RRK/MEDQ  D:  08/22/2005  T:  08/23/2005  Job:  [id number]   cc:   Kirk Ruths, M.D. [facility]  [address]  [address]  [place]  [place] [address]   [facility]
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
## 2011-02-27 NOTE — Discharge Summary (Signed)
[name] [name], [name] [name]              ACCOUNT NO.:  [id number]   MEDICAL RECORD NO.:  [id number]          PATIENT TYPE:  INP   LOCATION:  5501                         FACILITY:  [facility]   PHYSICIAN:  [name], M.D.DATE OF BIRTH:  [birth year]   DATE OF ADMISSION:  03/27/2005  DATE OF DISCHARGE:  04/03/2005                                 DISCHARGE SUMMARY   ADMITTING DIAGNOSES:  1.  Uremia.  2.  Insulin-dependent diabetes mellitus, poor control.  3.  Hypertension.  4.  Congestive heart failure secondary to volume overload.  5.  Hyperkalemia.  6.  Decreased visual acuity, suspect diabetic retinopathy.   DISCHARGE DIAGNOSES:  1.  Uremia and hyperkalemia, resolved with hemodialysis.  2.  End-stage renal disease, to resume outpatient dialysis at [facility].  3.  Insulin-dependent diabetes mellitus, improved control.  4.  Hypertension, improved control.  5.  Congestive heart failure secondary to volume excess, resolved with      dialysis.  6.  Decreased visual acuity, suspect diabetic retinopathy.  Needs outpatient      ophthalmology consult.  7.  Anemia of chronic disease, status post 2 units packed red blood cells.  8.  Nausea and vomiting, improving.  Etiology probably multifactorial.  9.  Bronchitis, on antibiotics.  10. Secondary hyperparathyroidism.   BRIEF HISTORY:  This 45 year old [name] male started dialysis in the  [place] approximately six months ago, having dialyzed at the [facility]  He left abruptly and went to visit family in  [place].  He only intended to stay approximately one month, but had trouble  returning to the states.  He presents complaining with decreased vision in  both eyes for the past six weeks.  He is admitted now with a BUN of 69,  potassium 6.3, creatinine 12.4, and hemoglobin 6.6.  Chest x-ray in the ER  shows cardiomegaly, no frank edema.   HOSPITAL COURSE:  1.  End-stage renal  disease.  The patient underwent dialysis on the day of      admission via his left arm AV fistula.  2.3 liters of ultrafiltrate were      removed without any drop in blood pressure, and the lowest recorded      pressure was 130/70.  He tolerated dialysis well and was dialyzed every      other day while hospitalized.  The lowest recorded weight was 92.9, with      a blood pressure of 92/60 at the low point on dialysis.  Outpatient      arrangements were made for the patient to dialyze Tuesday, Thursday, and      Saturday at the [facility] for four hours at a      time.  His hyperkalemia corrected to within normal limits.  His BUN and      creatinine came down to 45 and 12.  He dialyzed on the day of discharge.      Outpatient arrangements were made for dialysis on Monday April 06, 2005,      then again Thursday  April 09, 2005, then Saturday, Tuesday, Thursday      thereafter.  Dry weight will be set at 92 kg.  2.  Anemia.  The patient was transfused 2 units packed red blood cells on      admission.  Iron studies showed a transferrin saturation level of 24%, a      ferritin of 415.  Epo was started at 10,000 units IV each dialysis.  His      hemoglobin has remained stable at approximately 8.1.  Venofer will be      given at 100 mg IV each dialysis x 4 more doses as an outpatient, then      weekly.  3.  Secondary hyperparathyroidism.  Intact PTH came back at 249.  He is on 1      mcg of Hectorol IV each dialysis.  PhosLo was at 667 mg two with each      meal, and his phosphorus is at 6.7.  4.  Bronchitis.  The patient had a cough and complained of shortness of      breath.  His O2 saturation levels were above 95% on room air.  Chest x-      ray showed possibly bronchitic changes.  He did spike a temperature to      101.2.  Avelox was started at 400 mg empirically on the day of      admission.  He will complete a 10-day course.  He was sent home with a      prescription  for four more doses.  5.  Nausea and vomiting.  This is felt to be multifactorial.  He was given      proton pump inhibitor, and Reglan was started at 10 mg before each meal      and at bedtime.  It has improved over the course of this      hospitalization.  6.  Decreased visual acuity.  The patient needs an outpatient ophthalmology      appointment after discharge.   DISCHARGE MEDICATIONS:  1.  Avelox 400 mg one every evening for four more doses.  2.  Nephro-Vite vitamin 1 daily.  3.  PhosLo 667 mg two with each meal.  4.  Zocor 20 mg with supper.  5.  Prilosec one at bedtime at bedtime.  6.  Reglan 10 mg a.c. and at bedtime.  7.  70/30 insulin 30 units in the morning, 25 units in the evening.  8.  Venofer 100 mg IV each dialysis for four more doses, then weekly.  9.  Epo 10,000 units IV each dialysis.  10. Hectorol 1 mcg IV each dialysis.   Dialysis on Monday April 06, 2005, then Thursday April 09, 2005, then  Saturday, Tuesday, Thursday thereafter at [facility].  Dry weight 92 kg.   This discharge took greater than 30 minutes.       RRK/MEDQ  D:  04/03/2005  T:  04/03/2005  Job:  [id number]

## 2011-02-27 NOTE — Procedures (Signed)
[name] [name], ENGELSTAD NO.:  [id number]   MEDICAL RECORD NO.:  [id number]          PATIENT TYPE:  OUT   LOCATION:  SLEEP CENTER                 FACILITY[facility]  [facility]   PHYSICIAN:  Chesley Mires, MD        DATE OF BIRTH:  January 11, 1966   DATE OF STUDY:  08/20/2006                              NOCTURNAL POLYSOMNOGRAM   FACILITY:  [facility] STUDY:  This is an individual who has sleep disturbance,  snoring, witnessed apnea, and symptoms of excessive daytime sleepiness.  He  is referred to the sleep lab for evaluation of hypersomnia with obstructive  sleep apnea   EPWORTH SLEEPINESS SCORE:  12.   MEDICATIONS:  Insulin, PhosLo, Klonopin, amlodipine, simvastatin, and  Allegra.   SLEEP ARCHITECTURE:  Total recording time was 406 minutes.  Total sleep time  was 298.5 minutes.  Sleep efficiency was 74%.  Sleep latency was 20 minutes.  REM latency was 218.5 minutes, which is prolonged.  The study was normal for  the lack of slow wave sleep and a reduction in the percentage of REM sleep  to 10% of the study.  The patient slept in both the supine and nonsupine  position.   RESPIRATORY DATA:  The average respiratory rate was 16.  The apnea-hypopnea  index was 130.7.  The events were both mixed and obstructive in nature.  The  supine apnea-hypopnea index was 171.5 versus a nonsupine apnea-hypopnea  index of 112.9.  The REM apnea-hypopnea index was 78.7 versus a non-REM  apnea-hypopnea index of 136.6.  Loud snoring was noted by the technician.  Of note is that the patient was scheduled for a split-night study; however,  he had refused to wear CPAP due to a feeling of anxiety.   OXYGEN DATA:  The baseline oxygenation was at 93%.  The oxygen saturation  nadir was 57%.  The patient spent a total of 231.2 minutes of test time with  an oxygen saturation between 91-100%; 158.1 minutes with an oxygen  saturation between 81-90%; 11.8 minutes with an oxygen  saturation between 71-  80%; 4.4 minutes with an oxygen saturation between 61-70% and 0.3 minutes  with an oxygen saturation between 51-60%.   CARDIAC DATA:  The average heart rate was 87.  The rhythm strip showed  normal sinus rhythm with sinus tachycardia.   MOVEMENT-PARASOMNIA:  The periodic limb movement index was 17.1   IMPRESSIONS-RECOMMENDATIONS:  This study shows evidence for severe  obstructive sleep apnea, as demonstrated by an apnea-hypopnea index of 130.7  and an oxygen saturation nadir of 57%.  Of note is that the patient refused  the use of CPAP therapy during the test due to feelings of anxiety.  At this  time, the patient should be  strongly encouraged to reconsider the use of CPAP therapy, given the  severity of his sleep apnea as well as the significant oxygen desaturation.      Chesley Mires, MD  Diplomat, American Board of Sleep Medicine  Electronically Signed     VS/MEDQ  D:  08/25/2006 17:17:50  T:  08/25/2006 [id number]  Job:  [id number]

## 2011-02-27 NOTE — Assessment & Plan Note (Signed)
[facility] OFFICE NOTE   [name], [name]                       MRN:          [id number]  DATE:05/13/2006                            DOB:          1966/07/23    REASON FOR VISIT:  Follow up cardiac testing.   HISTORY OF PRESENT ILLNESS:  I saw [name] [name] back in June, in referral, with  a history of episodic chest pain and left arm discomfort in the setting of  hypertension, end-stage renal disease and type 2 diabetes mellitus.  I  referred him for additional risk stratification including an echocardiogram  which demonstrated upper normal left ventricular chamber size with normal  systolic function and no regional wall motion abnormalities.  There was mild  to moderate septal hypertrophy, moderately dilated left atrium and a very  small posterior pericardial effusion.  No other major valvular abnormalities  were noted.  Ischemic testing via a Myoview demonstrated borderline  perfusion abnormalities that were not clearly significant and overall  represented a low risk study without a clear large reversible perfusion  defect.  I reviewed these with him today and he was reassured.  He denies  having any typical anginal symptoms, describing only brief pinprick-like  sensations in his chest sometimes with dialysis.   ALLERGIES:  PENICILLIN.   PRESENT MEDICATIONS:  1.  Insulin 70/30 30 units subcu q.a.m. and 28 units subcu q.p.m.  2.  PhosLo 600 mL, 7 mg, four tablets p.o. q.before meals  3.  Clonazepam 0.5 mg 1-2 tabs p.o. q.h.s.  4.  Amlodipine 10 mg p.o. daily.  5.  Zocor 20 mg p.o. q.h.s.  6.  Allegra 60 mg p.o. b.i.d.   REVIEW OF SYSTEMS:  As described in his Present Illness.   PHYSICAL EXAMINATION:  Blood pressure is 135/82, heart rate 90-100, weight  223 pounds.  The patient is in no acute distress without active symptoms.  NECK:  Reveals no elevated jugular venous pressure.  LUNGS:  Clear without  labored breathing.  CARDIAC EXAM:  A regular rate and rhythm without pericardial rub or loud  murmur.  There is no S3, gallop noted.  EXTREMITIES:  Show no pitting edema.   IMPRESSION AND RECOMMENDATION:  Relatively low risk myocardial perfusion  study with normal ejection fraction by echocardiogram and no major valvular  abnormalities.   PLAN:  1.  Basic risk factor modification at this point and not proceed with any      invasive cardiac testing unless he develops progressive typical      symptomatology.  He was comfortable with this.  He      will, therefore, continue follow up with Dr. Clover Mealy.  2.  We can plan to see him back as needed.                                Satira Sark, MD    SGM/MedQ  DD:  05/13/2006  DT:  05/13/2006  Job #:  [id number]   cc:   Lambert Keto A.  Moshe Cipro, MD

## 2011-02-27 NOTE — Op Note (Signed)
[name] [name], [name] [name]              ACCOUNT NO.:  [id number]   MEDICAL RECORD NO.:  [id number]          PATIENT TYPE:  OIB   LOCATION:  2899                         FACILITY:  [facility]   PHYSICIAN:  Jessy Oto. Fields, MD  DATE OF BIRTH:  09-May-1966   DATE OF PROCEDURE:  09/01/2004  DATE OF DISCHARGE:                                 OPERATIVE REPORT   PROCEDURE PERFORMED:  Left brachiocephalic arteriovenous fistula.   PREOPERATIVE DIAGNOSIS:  End-stage renal disease.   POSTOPERATIVE DIAGNOSIS:  End-stage renal disease.   SURGEON:  Jessy Oto. Fields, MD   ANESTHESIA:  Local with IV sedation.   ASSISTANT:  Nurse.   OPERATIVE FINDINGS:  A 4 mm cephalic vein.   DESCRIPTION OF PROCEDURE:  After obtaining informed consent, the patient was  taken to the operating room.  The patient was placed in supine position on  the operating table.  After adequate sedation, the patient's entire left  upper extremity was prepped and draped in the usual sterile fashion.  Local  anesthesia was infiltrated near the antecubital crease.  Transverse incision  was made in this location and carried down through the subcutaneous tissues  and the cephalic vein was dissected free circumferentially.  It was slightly  thickened in character but of good diameter.  Side branches were ligated and  divided between silk ties.  Next, attention was turned toward the medial  portion of the incision.  The brachial artery was dissected free  circumferentially and controlled proximally and distally with vessel loops.  The patient was then given 3000 units of intravenous heparin.  The distal  cephalic vein was ligated with a 3-0 silk tie and transected.  The vein was  then swung over to the level of the artery.  Longitudinal arteriotomy was  made and the vein sewn end of vein to side of artery using running 7-0  Prolene suture.  Just prior to completion of the anastomosis, this was fore-  bled, back-bled and thoroughly  flushed.  Anastomosis was secured.  Clamps  were released.  There was a palpable thrill within the fistula immediately  which was palpable all the way up to the mid upper arm.  There was still a  palpable radial pulse at the end of the case.  Next, hemostasis was  obtained.  The subcutaneous tissues were reapproximated using running 3-0  Vicryl suture.  Skin closed with a 4-0 Vicryl subcuticular stitch.  The  patient tolerated the procedure well.  There were no complications.  Sponge,  needle and instrument counts were correct at the end of the case.  The  patient was then transferred to the recovery room in stable condition.      CEF/MEDQ  D:  09/01/2004  T:  09/01/2004  Job:  [id number]

## 2011-02-27 NOTE — Consult Note (Signed)
[name] [name], [name] [name] NO.:  [id number]   MEDICAL RECORD NO.:  [id number]                   PATIENT TYPE:  OBV   LOCATION:  [id number]                                 FACILITY:  [facility]   PHYSICIAN:  [name] [name], M.D.                  DATE OF BIRTH:  [birth year]   DATE OF CONSULTATION:  01/03/2004  DATE OF DISCHARGE:                                   CONSULTATION   REFERRING PHYSICIAN:  Dr. Kayleen Memos. [name].   PRIMARY CARE PHYSICIAN:  Medical Teaching Service, Dr. Larey Dresser in  the outpatient clinic.   CARDIOLOGIST:  His main cardiologist is through [facility] but the  patient is not sure which one.   SUMMARY OF HISTORY:  [name] [name] is a 45 year old white male who was admitted  by his primary M.D. secondary to chest discomfort.  He describes a 1-week  history of left anterior chest/shoulder stabbing discomfort which last  seconds associated with a pressure that goes into his left shoulder, arm and  shoulder blade which may last for a few minutes.  It has been occurring  approximately every 5 minutes for the last week or so.  He cannot describe  specific amounts of episodes during the day, except a lot.  He feels that  he has occasional nausea and diaphoresis.  He denies any shortness of  breath.  This has been occurring for 1 week.  He denies any injuries.  He is  not sure about alleviating or aggravating factors and feels that it may have  some relief with hot water and rubbing on his shoulder.  He cannot erase the  discomfort.  He does denies prior occurrences.  He cannot describe if he has  any exercise limitations.   ALLERGIES:  Past medical history is notable for a PENICILLIN allergy for  which he develops a rash.   MEDICATIONS:  1. He is on insulin at home.  2. Altace 5 mg b.i.d.  3. Hydrochlorothiazide 25 mg daily.  4. Norvasc 10 mg daily.   PAST MEDICAL HISTORY:  1. He has a history of insulin-dependent diabetes with  associated     retinopathy.  His sugars at home usually run 240s to 260s.  2. He has a history of hypertension.  3. Several admissions for cellulitis.  4. Known hyperlipidemia, which is untreated.  5. Post-traumatic stress disorder.  6. He has had approximately 8 surgeries on his left eye.   SOCIAL HISTORY:  He resides in [place] alone.  He fled from [place]  secondary to political arrest, seeking political asylum in the [place].  He is currently unemployed.  He used to be a Special educational needs teacher.  He  denies any tobacco, alcohol, drug, herbal medication use.  He states that he  does follow a low-sugar diet.  He denies any regular exercise.   FAMILY HISTORY:  His mother is 23 with  a history of kidney stones,  hypertension and kidney problems.  His father is 18 with a history of  diabetes.  He has 4 brothers and 2 sisters who are alive and well.   REVIEW OF SYSTEMS:  Review of systems is notable for chills, glasses, near-  syncope associated with low sugar, shoulder arthralgias and constipation, in  addition to above.   PHYSICAL EXAM:  GENERAL:  Well-nourished, pleasant male in no apparent  distress.  VITAL SIGNS:  Temperature 97.1, blood pressure 135/80, pulse 92 and regular,  respirations 20 __________ , saturations 99% on room air and admission  weight was 202.  HEENT:  Unremarkable.  NECK:  Neck supple without thyromegaly, adenopathy, JVD or carotid bruits.  CHEST:  Symmetrical excursions, without rales, rhonchi or wheezing.  HEART:  Regular rate and rhythm.  No murmurs, rubs, clicks or gallops.  All  pulses were symmetrical and intact.  SKIN:  Skin does not show any integument breakage.  ABDOMEN:  Abdomen slightly obese, bowel sounds present, without  organomegaly, mass or tenderness.  EXTREMITIES:  Negative cyanosis, clubbing or edema.  Peripheral pulses  symmetrical and intact.  MUSCULOSKELETAL:  Unremarkable.  NEUROLOGIC:  Unremarkable.   LABORATORIES:  Chest x-ray  does not show any active disease.   EKG shows normal sinus rhythm, left axis deviation with a ventricular rate  of 91.  Intervals are within normal limits.  He has nonspecific changes.  There are no EKGs to compare to.   Hemoglobin and hematocrit are 11.2 and 31.7, normal indices, platelets  406,000, WBC 5.6.  Sodium 136, potassium 4.1, BUN 39, creatinine 2.7,  glucose 226, normal LFTs.  Calcium is low at 7.7.  PTT 36, PT 13.2.  CK  total, MB and relative index -- 409, 12.5 and 3.1 for the first set; second  set is 291, 9.7 and 3.3, which are all elevated; however, his troponins are  within normal limits at 0.02, 0.02.  Total cholesterol is elevated at 346,  triglycerides 312, HDL is low at 34, LDL is elevated at 250.   IMPRESSION:  1. Atypical chest discomfort of uncertain etiology.  EKG and troponins are     negative for myocardial infarction.  CK total/MBs are elevated, however,     this could be related to his renal insufficiency.  2. Hyperglycemia with an elevated hemoglobin A1c at 11.7 and known history     of diabetes which appears to be uncontrolled.  3. Renal insufficiency of uncertain duration.  In December 2004, his BUN and     creatinine were 16 and 1.7.  Prior to admission, he was on an ACE     inhibitor; at this time, this has been held.  4. Hyperlipidemia, which is untreated.  5. History of hypertension.   DISPOSITION:  After Dr. Dola [name] reviewed the patient's history, spoke  on exam with the patient, he agrees with above.  It is felt that with his  multiple risk factors, he should undergo a stress Cardiolite while here in  the hospital.  His primary care M.D.s have orders an echocardiogram and a  renal ultrasound to further evaluate his kidney status.  We will start an  aspirin and begin him on high-dose Lipitor 80 mg p.o. nightly.  He will need fasting lipids and LFTs in approximately 6-8 weeks.  He needs aggressive  cardiac risk factors modification.  If his   Cardiolite happens to be positive, he will need to undergo cardiac  catheterization with renal protection.  I have called the office for his old  medical records as he did have an echocardiogram through our office last  year; both were reported to be normal, but these records are pending review.     Sharyl Nimrod, P.A. [facility]                    [name] [name], M.D.    EW/MEDQ  D:  01/03/2004  T:  01/05/2004  Job:  [id number]   cc:   c/o [name] [name], M.D. [facility]

## 2011-02-27 NOTE — Op Note (Signed)
NAMEMERLE, [name] [name]              ACCOUNT NO.:  [id number]   MEDICAL RECORD NO.:  [id number]          PATIENT TYPE:  INP   LOCATION:  5526                         FACILITY:  [facility]   PHYSICIAN:  Dorothea Glassman, M.D.    DATE OF BIRTH:  19-Apr-1966   DATE OF PROCEDURE:  07/08/2004  DATE OF DISCHARGE:                                 OPERATIVE REPORT   PREOPERATIVE DIAGNOSIS:  Chronic renal insufficiency.   POSTOPERATIVE DIAGNOSIS:  Chronic renal insufficiency.   PROCEDURE:  Left Cimino fistula.   SURGEON:  Dorothea Glassman, M.D.   ASSISTANT:  Leta Baptist, PA.   ANESTHESIA:  Local and MAC.   DESCRIPTION OF PROCEDURE:  The patient was brought to the operating room in  stable condition.  Placed in supine position.  Left arm prepped and draped  in sterile fashion.   Skin and subcutaneous tissue instilled with 1% Xylocaine with epinephrine.  A longitudinal skin incision made over the left anatomical snuff box.  Dissection carried down through the subcutaneous tissue.  Cephalic vein  identified.  This was approximately 3 mm in size.  The vein mobilized.  Tributaries ligated with 4-0 silk.  The vein ligated distally with a clip  and divided transversely.  The radial artery was exposed through the same  incision, encircled with Vesi-loops proximally and distally.  The patient  administrated 2000 units of heparin intravenously.  The radial artery  controlled proximally and distally with bulldog clamps.  A longitudinal  arteriotomy made.  The vein divided at appropriate length and anastomosed  end-to-side with a radial artery with running 7-0 Prolene suture.  Clamps  then removed.  Excellent flow present.  Adequate hemostasis obtained.  Sponge and instrument counts correct.   Subcutaneous tissue closed with running 3-0 Vicryl suture.  Skin closed with  4-0 Monocryl.  Steri-Strips applied.   The patient tolerated the procedure well and was transferred to the recovery  room in stable  condition.       PGH/MEDQ  D:  07/08/2004  T:  07/08/2004  Job:  [id number]

## 2011-02-27 NOTE — Op Note (Signed)
NAMESHEAMUS, WAGGENER NO.:  [id number]   MEDICAL RECORD NO.:  [id number]          PATIENT TYPE:  INP   LOCATION:  2037                         FACILITY:  [facility]   PHYSICIAN:  Jessy Oto. Fields, MD  DATE OF BIRTH:  [birth year]   DATE OF PROCEDURE:  07/05/2004  DATE OF DISCHARGE:                                 OPERATIVE REPORT   PROCEDURE:  1.  Insertion of right internal jugular vein Diatek catheter.  2.  Neck ultrasound.   PREOPERATIVE DIAGNOSIS:  End-stage renal disease.   POSTOPERATIVE DIAGNOSIS:  End-stage renal disease.   ANESTHESIA:  Local with IV sedation.   OPERATIVE FINDINGS:  1.  Widely patent right internal jugular vein.  2.  A 19 cm Diatek catheter.   OPERATIVE DETAILS:  After obtaining informed consent, the patient was taken  to the operating room.  The patient was placed in supine position on the  operating table.  After sedation, the patient's neck was inspected with a  __________ ultrasound.  The right internal jugular vein was found to be  widely patent with normal compressibility and respiratory variability.  Next, the patient's neck and chest were prepped and draped in the usual  sterile fashion.  Local anesthesia was infiltrated over the right internal  jugular vein.  A small finder needle was used to localize the right internal  jugular vein.  Next, a large introducer needle was used to cannulate the  right internal jugular vein.  A 0.035 J-tip guidewire was then threaded  through the internal jugular vein, down into the inferior vena cava under  fluoroscopic guidance.  Next, sequential 12, 14, and 16 French dilators were  placed over the guidewire, and a 16 [place] dilator with a peel-away-sheath  was placed over the dilator into the right atrium.  The guidewire and the  dilator were removed.  A 19 cm Diatek catheter was threaded through the peel-  away-sheath into the right atrium.  Peel-away-sheath was then removed.  The  catheter was  tunneled out subcutaneously to a more lateral position on the  neck, cut to length, the hub attached.  It was noted to flush and draw  easily.  The catheter was sutured to the skin and the neck insertion site  closed with 0 Vicryl suture.  The catheter was then inspected under  fluoroscopy, found with its tip to be in the right atrium, no kinks  throughout its course.  The catheter was then loaded with concentrated  heparin solution.  The patient tolerated the procedure well, and there were  no complications.  Instrument, sponge, and needle counts were correct at the  end of the case.  The patient was taken to the recovery room in stable  condition.      CEF/MEDQ  D:  07/05/2004  T:  07/05/2004  Job:  [id number]

## 2011-02-27 NOTE — Assessment & Plan Note (Signed)
[facility]                               PULMONARY OFFICE NOTE   QUENT, LINGENFELTER                       MRN:          [id number]  DATE:07/01/2006                            DOB:          Oct 27, 1965    REFERRING PHYSICIAN:  Darrold Span. Florene Glen, M.D.   I had the pleasure of meeting Mr. Yael today for evaluation of his sleep  difficulties.  He says that he becomes very nervous at night.  He typically  will try and go to bed between 10 and 11, although sometimes he will stay  awake all night and then go to sleep at 7 a.m. and other times he will stay  awake and then feel very sleep after his dialysis.  He says he is often  times sleeping at various times throughout the day otherwise.  When he does  go to sleep, he snores and he will wake up with a choking sensation.  He  also tends to get a dry mouth at night and tends to breath more through his  mouth.  He says if he tries sleeping on his back, he has difficulty with his  breathing.  He sometimes will grind his teeth at night, and he will often  times wake up with a headache.  He occasionally also gets nightmares.  He  says he has been taking Klonopin to help with his sleep as well as his  nerves.  He also sometimes get uncomfortable feelings in his legs,  particularly with dialysis and he says that this can sometimes cause him  difficulty with sleep but usually this is not much of a problem.  He denies  any history of walking in his sleep.  There is no history of sleep  hallucinations, sleep paralysis, or cataplexy.  He is not currently using  anything to help him stay awake during the day, although he does drink one  cup of coffee in the morning.   PAST MEDICAL HISTORY:  1. Insulin-dependent diabetes.  2. He has had diabetic nephropathy with end-stage renal disease and is      currently on hemodialysis.  3. He has had diabetic retinopathy.  4. He has had a left arm graft for his dialysis.  5. He has hypertension.  6. Hyperlipidemia.  7. Erectile dysfunction.   MEDICATIONS:  1. Insulin 70/30, thirty units in the morning and twenty-eight units at      night.  2. PhosLo 667 mg, four tablets before meals.  3. Klonopin 0.5 mg, one or two pills q.h.s.  4. Amlodipine 10 mg every day.  5. Simvastatin 20 mg q.h.s.  6. Allegra 60 mg b.i.d.   HE HAS AN ALLERGY TO PENICILLIN.   FAMILY HISTORY:  Significant for his mother who has hypertension.  Father  has diabetes.  Brother has hypertension.   SOCIAL HISTORY:  He is divorced.  He has no children.  There is no history  of alcohol or tobacco use.  He is currently on disability.  He immigrated  from [place] in 1998, and he used to work in [employer]  industry.   REVIEW OF SYSTEMS:  He says that he has gained approximately 16 pounds,  otherwise essentially negative except for what is stated above in the  history of present illness.   PHYSICAL EXAMINATION:  VITAL SIGNS:  He is 5 feet 10 inches tall, 223  pounds, temperature is 97.9, blood pressure is 140/80, heart rate is 80,  oxygen saturation is 96% on room air.  HEENT:  Pupils reactive.  There is no sinus tenderness.  He has a MP 4  airway.  There is no lymphadenopathy.  HEART:  S1 S2.  CHEST:  No wheezing or rales.  ABDOMEN:  Obese, soft, nontender.  EXTREMITIES:  No edema, cyanosis, or clubbing.  NEUROLOGIC:  He was alert and oriented x3.  Strength 5/5.  No cerebellar  deficits were appreciated.   IMPRESSION:  1. He certainly has symptoms as well as physical findings which would be      suggestive of obstructive sleep apnea.  Given the fact that he does      also have diabetes as well as hypertension, I do feel that it would be      warranted for him to undergo further evaluation of this with an      overnight polysomnogram and then depending upon review of this, if in      fact he does have sleep apnea, we would initiate him on appropriate      therapy.  In the  meantime, I have discussed with him the importance of      diet, exercise, and weight reduction as well as avoidance of alcohol      and sedatives.  Driving precautions were discussed with him as well      until his sleep disorder is under adequate control.  2. Erratic sleep pattern.  I have emphasized to him the importance of      maintaining a regular sleep wake schedule and I have discussed various      techniques with regards to improving his sleep hygiene.  3. Possible restless leg syndrome.  This does not seem to be much of a      problem for him at the present time and therefore, I would defer      further evaluation of this until his other sleep difficulties are under      more suitable control.  If, however, after controlling his other sleep      problems this is still causing him discomfort, then further evaluation      may be warranted.   Followup with me would be in 1 month.                                  Chesley Mires, MD   VS/MedQ  DD:  07/01/2006 DT:  07/03/2006 Job #:  [id number]   cc:   Darrold Span. Florene Glen, M.D.

## 2011-02-27 NOTE — Consult Note (Signed)
NAMEANTONIE, FAUSNAUGH NO.:  [id number]   MEDICAL RECORD NO.:  [id number]                   PATIENT TYPE:  OBV   LOCATION:  [id number]                                 FACILITY:  [facility]   PHYSICIAN:  Jay Schlichter, M.D.               DATE OF BIRTH:  September 11, 1966   DATE OF CONSULTATION:  01/03/2004  DATE OF DISCHARGE:                                   CONSULTATION   REFERRING PHYSICIAN:  Jay Schlichter, M.D.   REASON FOR CONSULTATION:  Mr. Scholler is a 45 year old male admitted by his  primary care physician secondary to chest discomfort.  He describes a one-  week history of left anterior chest and shoulder stabbing pain which last  seconds associated with a pressure that radiates into the left shoulder and  shoulder blade, which last for a few minutes.  He states that this comes and  goes all day, maybe lasting five minutes at times.  He is not sure of the  specifics and number of episodes that occur during the day. He is not sure  of any alleviating or aggravating factors.  He denies any recent injuries or  trauma.  Occasionally, he would have associated nausea and diaphoresis.  He  denies prior occurrences.  He states he may, but he is not sure, if he  obtains some relief with hot water running on his shoulder and thus  presented.   Dictation ended at this point.     Sharyl Nimrod, P.A. [facility]                    Jay Schlichter, M.D.    EW/MEDQ  D:  01/03/2004  T:  01/03/2004  Job:  [id number]

## 2011-02-27 NOTE — Assessment & Plan Note (Signed)
[facility]                             PULMONARY OFFICE NOTE   BENYAM, SPROUL                       MRN:          [id number]  DATE:09/09/2006                            DOB:          03-30-1966    I saw Mr. Sanandres today in followup after he had undergone his overnight  polysomnogram on August 20, 2006.   He was originally scheduled for a split night study but he was not able  to tolerate the use of CPAP due to anxiety and feelings of  claustrophobia from the CPAP mask.  Otherwise he was found to have very  severe obstructive sleep apnea with an overall apnea/hypopnea index of  130.7 and oxygen saturation nadir of 57% and he appeared to have both  mixed as well as obstructive events.   At this time I have reviewed the results of Mr. Reisdorf sleep study  report with him and I had strongly emphasized to him the importance of  undergoing therapy for his obstructive sleep apnea.  We discussed  several options for this including CPAP therapy, oral appliance and  surgical intervention.  Again, given the severity of his sleep apnea,  his best option would be to use CPAP therapy.  He has somewhat  reluctantly agreed to this.   What I will do is set him up for a CPAP titration study and I have given  him a prescription for Ambien 5 mg which he can use on the night of his  study to help assist in falling asleep while he is in the CPAP mask.  Additionally, I have discussed with him several techniques which should  hopefully help as far as giving him a chance to acclimitize to using  CPAP therapy once he is set up for this at home.     Chesley Mires, MD  Electronically Signed    VS/MedQ  DD: 09/09/2006  DT: 09/10/2006  Job #: [id number]   cc:   Louis Meckel, M.D.  Satira Sark, MD
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
## 2011-02-27 NOTE — Consult Note (Signed)
[name] [name] NO.:  [id number]   MEDICAL RECORD NO.:  [id number]          PATIENT TYPE:  INP   LOCATION:  2037                         FACILITY:  [facility]   PHYSICIAN:  [name], M.D.DATE OF BIRTH:  05-Jul-1966   DATE OF CONSULTATION:  07/04/2004  DATE OF DISCHARGE:                                   CONSULTATION   REASON FOR CONSULTATION:  Elevated creatinine.   HISTORY:  The patient is a 45 year old [name] white male presenting with  severe progressive fatigue for several months to a year or two.  He also  complains of excessive somnolence, swollen ankles, shortness of breath, but  no orthopnea.  He has a chronic dry cough for two years.  He has been on an  ACE inhibitor for two years.  He denies any chest pain.  He has developed  nausea over the past two months to the point where he has dry heaves every  morning.  He denies any fever, chills, or sweats.   PAST MEDICAL HISTORY:  1.  Diabetes mellitus, type 2, of 10 years' duration with retinopathy and      poor vision.  2.  Hypertension, poor control, less than one year's duration.  3.  Chronic renal failure.  Creatinine was 1.7 in December, 2004, and 2.8 in      March, 2005.  4.  Cardiac workup in March, 2005 for chest pain showed negative Cardiolite      with ejection fraction 50%.  5.  Anemia.   MEDICATIONS:  1.  Altace 5 a day.  2.  Calcium supplement.  3.  70/30 insulin.   ALLERGIES:  PENICILLIN.   FAMILY HISTORY:  Mother has kidney stones.  Father has diabetes.  He has two  sisters and three brothers, but no renal history.   SOCIAL HISTORY:  The patient lives alone, has family members in [place].  He  was born in [place].  He is divorced.  He has no children.  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  He came to the [place] in 1998 during a time of instability in [place].  Several of his best  friends were murdered during this time.   REVIEW OF  SYSTEMS:  GENERAL:  Denies fever, chills, sweats, and weight loss.  ENT:  Denies hearing loss.  He does have worsening of his vision over the  past month, no sore throat or trouble swallowing.  CARDIORESPIRATORY:  As  above.  GASTROINTESTINAL:  No abdominal pain or diarrhea or constipation.  MUSCULOSKELETAL:  Denies arthralgia or myalgia.  He does have swollen  ankles.  NEUROLOGIC:  Denies focal numbness or weakness.  He does have  numbness in both feet from the diabetic neuropathy.  PSYCHIATRIC:  He has  chronic nightmares and severe stress which he says has been contributing  over the past several years to poor diabetic control.  He has nightly  nightmares which he relates to the murders of numerous professional friends  during an unstable political period in [place] in the late 1990s.  ENDOCRINE:  Denies heat or cold intolerance.   HABITS:  No drug or alcohol use, no tobacco.   PHYSICAL EXAMINATION:  VITAL SIGNS:  Temperature 97, blood pressure 160/106,  pulse 110, respirations 20, 99% O2 saturation on room air, weight 222  pounds.  GENERAL:  This is an alert, pleasant, well-developed male.  SKIN:  Warm and dry.  HEENT:  PERRLA, EOMI.  Throat was clear.  NECK:  Supple without JVD.  CHEST:  Clear throughout.  CARDIAC:  Regular rate and rhythm without murmur, rub, or gallop.  ABDOMEN:  Soft, nontender, with no organomegaly.  EXTREMITIES:  2+ edema in the ankles, good pulses in the feet, no pulses, no  carotid or femoral or abdominal bruits.  NEUROLOGIC:  Mild distal sensory loss in both feet, no asterixis, no focal  deficits or motor deficits.   LABORATORY STUDIES:  White blood count 6.6, hemoglobin 8.1, hematocrit 22%,  platelets normal.  Sodium 140, potassium 5.9.  It was 7.3 on admission.  BUN  49, creatinine 6.3, CO2 20.  Liver enzymes within normal limits.  Albumin  2.8, calcium 7.1, phosphorus 5.9.  Urinalysis:  Greater than 300 protein, 3-  6 red blood cells, 0-2 white blood  cells.  Renal ultrasound done in March of  this year showed echogenic kidneys, no hydronephrosis, slightly enlarged.   IMPRESSION:  1.  Progressive chronic renal failure due to diabetic nephropathy with      uremic symptoms for at least two months, probably longer.  The      creatinine was mid 2's in March, and this progression likely      irreversible.  2.  Volume excess, moderate, without congestive heart failure.  3.  ACE inhibitor-related chronic cough.  4.  Hypertension uncontrolled due to volume excess in part.  5.  Diabetes mellitus, type 2, with retinopathy.  6.  Severe post-traumatic stress.   RECOMMENDATIONS:  1.  Initiate hemodialysis.  This was discussed at length with the patient      who agrees to proceed.  Will consult CVTS for Diatek catheter placement      as well as permanent access during this hospitalization.  2.  Transfuse two units of packed red blood cells with dialysis.  3.  Remove volume.  4.  Educate about kidney failure and dialysis.  5.  Would stop ACE inhibitor and list as an intolerance;  try either calcium      blocker or beta blocker or an ARB.  6.  Would get a professional evaluation for the patient's stress disorder      while the patient is hospitalized.      RDS/MEDQ  D:  07/04/2004  T:  07/06/2004  Job:  [id number]   cc:   Jacquelynn Cree, M.D.  Int. Med. - Resident - [address]  [place],  [address]  Fax: [phone]

## 2011-02-27 NOTE — Procedures (Signed)
[name] [name], STUP NO.:  [id number]   MEDICAL RECORD NO.:  [id number]          PATIENT TYPE:  OUT   LOCATION:  SLEEP CENTER                 FACILITY:  [facility]   PHYSICIAN:  Chesley Mires, MD        DATE OF BIRTH:  12-21-1965   DATE OF STUDY:  11/03/2006                            NOCTURNAL POLYSOMNOGRAM   REFERRING PHYSICIAN:  Chesley Mires, MD   INDICATION FOR STUDY:  This is an individual who had an overnight  polysomnogram done on August 20, 2006 which showed an apnea-hypopnea  index of 130.7 and oxygen saturation nadir of 57%.  He returns to the  sleep lab for a CPAP titration study.   MEDICATIONS:  Insulin, PhosLo, Klonopin, amlodipine, simvastatin,  Allegra.   SLEEP ARCHITECTURE:  Total recording time was 401 minutes.  Total sleep  time was 110 minutes.  Sleep efficiency was 27%, which is significantly  reduced.  The study was notable for a lack of slow wave sleep and REM  sleep.  Sleep latency was 185.5 minutes, which was prolonged.  The  patient appeared to have difficulty with sleep initiation due to  respiratory events.  The patient slept in both the supine and the  nonsupine position.   RESPIRATORY DATA:  The average respiratory rate was 15.  The patient was  titrated from a CPAP pressure setting of 4 to 17 cm of water.  At a CPAP  pressure setting of 17 cm of water his apnea-hypopnea index was 48.4,  although he only had 3.7 minutes of sleep time at this pressure setting.  He appeared to still have episodes of snoring as well.   OXYGEN DATA:  The baseline oxygenation was 98%.  The oxygen saturation  nadir was 78%.  At a CPAP pressure setting of 17 cm of water, the mean  oxygen saturation during non-REM sleep was 97%, and the minimal  oxygenation during non-REM sleep was 94%.   CARDIAC DATA:  The average heart rate was 89.  Rhythm strip showed  normal sinus rhythm.   MOVEMENT-PARASOMNIA:  The periodic limb movement index was 110.  The  patient  was quite restless during the test and had actually taken his  mask off on one occasion.   IMPRESSIONS-RECOMMENDATIONS:  This is an incomplete CPAP titration study  due to difficulty with sleep initiation and decreased amount of sleep  time.  Consideration could be given to having the patient recruited to  the sleep lab again, starting out at an initially higher pressure  setting or trying him on bilevel positive airway pressure.  Additionally, he could be set up for an auto titration study at home if  this would improve his ability to tolerate initial  PAP set-up, and then he could be referred back to the sleep lab at a  later time once he is more acclimated to the use of PAP therapy.      Chesley Mires, MD  Diplomat, American Board of Sleep Medicine  Electronically Signed     VS/MEDQ  D:  11/17/2006 16:05:45  T:  11/17/2006 21:14:38  Job:  [id number]

## 2011-02-27 NOTE — H&P (Signed)
NAMENANAYAW, WENNBERG NO.:  [id number]   MEDICAL RECORD NO.:  [id number]          PATIENT TYPE:  INP   LOCATION:  [id number]                         FACILITY:  [facility]   PHYSICIAN:  [name] [name], M.D.DATE OF BIRTH:  December 30, 1965   DATE OF ADMISSION:  06/11/2005  DATE OF DISCHARGE:                                HISTORY & PHYSICAL   CHIEF COMPLAINT:  Chest pain and shortness of breath.   HISTORY OF PRESENT ILLNESS:  [name] [name] is a 45 year old Asian male with a  past medical history significant for end stage renal disease (Tuesdays,  Thursdays and Saturdays on dialysis at [facility]), diabetes,  hypertension, with history of chest pain in May 2005 with a negative  Adenosine Cardiolite.  The patient presented this morning to the [facility] and before actually being placed on dialysis, the patient  experienced left to substernal chest pain he describes as pressure.  The  patient broke out into a sweat and was suffering from dyspnea.  He denies  nausea and vomiting.  He states that the pain did not radiate.  The patient  states prior to this a.m., he had been in his usual state of health.  The  patient was transported via EMS to the emergency room.  He was found to have  a systolic blood pressure greater than 200.  In the emergency room, the  patient's chest pain was resolved after one nitroglycerin.  However, the  patient was found to have a potassium of 7.1 with continued hypertension.   ADMISSION LABORATORY DATA:  Troponin, first set less than 0.05, potassium  7.4, white cell count 8, hemoglobin 14, hematocrit 42.5, platelets 298.  Admission EKG showed ST segment changes that are new when compared to old  EKGs.   PAST MEDICAL HISTORY:  See above.  1.  Diabetes with known gastroparesis.  2.  Post traumatic stress syndrome.  3.  Hyperlipidemia.   ALLERGIES:  Penicillin and ACE inhibitors (cough).   MEDICATIONS:  1.  PhosLo 667 mg 2 p.o.  t.i.d. a.c.  2.  Prilosec 20 mg one at bedtime.  3.  Quinine sulfate p.r.n.  4.  Sorbitol p.r.n.  5.  Insulin 70/30, 30 units in the a.m., 25 units in the p.m.  6.  Zocor 20 mg daily.  7.  Reglan 10 mg q.i.d. a.c. and h.s.  8.  Nephrovite one daily.  9.  Tylenol p.r.n.  10. Phenergan p.r.n.   FAMILY HISTORY:  Noncontributory.   SOCIAL HISTORY:  The patient currently lives in [place] with a friend.  He is not married and has no children.  Prior to hemodialysis, the patient  was in school at [employer].  He denies tobacco or alcohol use.   REVIEW OF SYMPTOMS:  Denies fever, denies weight change, no headaches, no  change in vision.  No abdominal pain, no nausea and vomiting.  Denies  urinary symptoms.  Denies bowel changes.   PHYSICAL EXAMINATION:  VITAL SIGNS:  Temperature 96.9, heart rate 84, respirations 11, blood  pressure 171/98.  GENERAL:  [name] [name] is a very pleasant  45 year old Asian gentleman who is  seen currently on hemodialysis.  He is slightly diaphoretic but in no acute  distress at this time.  NECK:  Supple, there is no JVD.  HEART:  Regular rate and rhythm, S1 and S2 is heard, no murmurs.  LUNGS:  Bibasilar crackles, otherwise, clear to auscultation.  ABDOMEN:  Positive bowel sounds and soft, nontender to palpation.  EXTREMITIES:  Negative bilateral lower extremity edema.  Access left upper  arm AV fistula currently in use with hemodialysis.   LABORATORY DATA:  See HPI.   ASSESSMENT AND PLAN:  1.  Acute chest pain.  The patient will be admitted to telemetry.  We will      order serial enzymes to rule out myocardial infarction given the      patient's risk factors.  We will ask cardiology to evaluate considering      ST changes.  We will start him on an aspirin.  2.  Hyperkalemia.  The patient will get planned for urgent hemodialysis      using a 0 K bath for one hour and a 1 K bath for two hours.  3.  Hypertension.  Continue with hemodialysis with  increased      ultrafiltration.  4.  End stage renal disease.  We will continue his hemodialysis Tuesday,      Thursday and Saturday schedule.  5.  Anemia.  We will continue the patient on his current dose of Epogen.      Currently, his hemoglobin is at goal.  6.  Diabetes.  We will continue the patient on his usual outpatient insulin      as well as sliding scale coverage.      Leafy Kindle, PA    ______________________________  [name] [name], M.D.    MY/MEDQ  D:  06/11/2005  T:  06/11/2005  Job:  [id number]

## 2011-06-03 ENCOUNTER — Encounter: Payer: Self-pay | Admitting: Vascular Surgery

## 2011-06-03 ENCOUNTER — Encounter: Payer: Self-pay | Admitting: *Deleted

## 2011-06-03 ENCOUNTER — Ambulatory Visit (INDEPENDENT_AMBULATORY_CARE_PROVIDER_SITE_OTHER): Payer: Medicare Other | Admitting: Vascular Surgery

## 2011-06-03 VITALS — BP 151/76 | HR 96 | Resp 20 | Ht 72.0 in | Wt 303.0 lb

## 2011-06-03 DIAGNOSIS — N186 End stage renal disease: Secondary | ICD-10-CM

## 2011-06-03 DIAGNOSIS — T82898A Other specified complication of vascular prosthetic devices, implants and grafts, initial encounter: Secondary | ICD-10-CM

## 2011-06-03 NOTE — Progress Notes (Signed)
Subjective:     Patient ID: [name] [name], male   DOB: [birth year], 45 y.o.   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 He was referred by Dr. Jamal Maes. He began noticing the erythema overlying his AV fistula approximately 2 weeks ago. Of note the fistula is no longer being used. He underwent renal transplant which is functioning well. He has had no recent uremic symptoms. Specifically he denies nausea, vomiting, anorexia, or fatigue.  Past Medical History  Diagnosis Date  . Diabetes mellitus   . Hypertension   . Leg pain 02/05/11    with walking  . Weight gain   . Shortness of breath     when lying flat  . Diminished eyesight   . Hearing difficulty   . Chronic kidney disease     Dia;ysis MWF  . Anemia   . Nervousness   . COPD (chronic obstructive pulmonary disease)   . CHF (congestive heart failure)   . Posttraumatic stress disorder   . Allergic rhinitis     Family History  Problem Relation Age of Onset  . Hypertension Mother   . Diabetes Father     History  Substance Use Topics  . Smoking status: Never Smoker   . Smokeless tobacco: Not on file  . Alcohol Use: No    Allergies  Allergen Reactions  . Ace Inhibitors     REACTION: cough  . Penicillins Nausea And Vomiting    Current Outpatient Prescriptions  Medication Sig Dispense Refill  . allopurinol (ZYLOPRIM) 300 MG tablet Take 300 mg by mouth daily.        Marland Kitchen aspirin 325 MG tablet Take 325 mg by mouth daily.        . calcitRIOL (ROCALTROL) 0.5 MCG capsule Take 0.5 mcg by mouth 4 (four) times daily.        . clonazePAM (KLONOPIN) 0.5 MG tablet Take 0.5 mg by mouth at bedtime.        . cloNIDine (CATAPRES) 0.1 MG tablet Take 0.1 mg by mouth as needed.        . furosemide (LASIX) 40 MG tablet Take  40 mg by mouth 2 (two) times daily.        . insulin detemir (LEVEMIR) 100 UNIT/ML injection Inject 90 Units into the skin 3 (three) times daily.       . insulin lispro (HUMALOG) 100 UNIT/ML injection Inject 50 Units into the skin. Take 50 units five times / day (with meals and snacks)      . mycophenolate (CELLCEPT) 250 MG capsule Take 250 mg by mouth. Take 3 tablets twice / day       . potassium chloride (MICRO-K) 10 MEQ CR capsule Take 10 mEq by mouth. Take 2 tablets twice/day       . predniSONE (DELTASONE) 5 MG tablet Take 5 mg by mouth daily.        . rosuvastatin (CRESTOR) 20 MG tablet Take 20 mg by mouth daily.        . tacrolimus (PROGRAF) 0.5 MG capsule Take 0.5 mg by mouth. Take 3 tabs twice / day       . Tamsulosin HCl (FLOMAX) 0.4 MG CAPS Take 0.4 mg by mouth daily.        . metolazone (ZAROXOLYN) 5  MG tablet Take 5 mg by mouth. Take 1 tablet M-W-F         Review of Systems  Constitutional: Negative for fever and chills.  Respiratory: Positive for chest tightness. Negative for shortness of breath.   Cardiovascular: Positive for leg swelling. Negative for chest pain and palpitations.       Objective:   Physical Exam  Neck: Neck supple. No JVD present. No thyromegaly present.  Cardiovascular: Normal rate and regular rhythm.  Exam reveals no friction rub.   No murmur heard. Pulmonary/Chest: Breath sounds normal. He has no wheezes. He has no rales.  Abdominal: Soft. Bowel sounds are normal. There is no tenderness.  Musculoskeletal: He exhibits no edema.  Lymphadenopathy:    He has no cervical adenopathy.  Skin: No rash noted.   Filed Vitals:   06/03/11 1615  BP: 151/76  Pulse: 96  Resp: 20    Body mass index is 41.09 kg/(m^2).  On physical exam the left upper arm AV fistula is markedly aneurysmal. There is an especially large aneurysm at the proximal fistula which has some erythema associated with this. In addition the skin appears to be thinning out over this  aneurysm.      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 His was placed in April of 2009. Given the erythema associated with this aneurysm and the thinning of the overlying skin I think he is at risk for breakdown of the skin and rupture of the aneurysm.    Plan:     I have recommended exploration of his fistula tomorrow with hopefully revision of the fistula to salvage it in case he needs it in the future for hemodialysis access. However, I have also explained that we may need to ligate the fistula if we are unable to salvage it. Have discussed the indications for surgery and the potential complications including the risk of nonhealing.

## 2011-06-04 ENCOUNTER — Ambulatory Visit (HOSPITAL_COMMUNITY)
Admission: RE | Admit: 2011-06-04 | Discharge: 2011-06-04 | Disposition: A | Discharge status: Home / Self Care | Payer: Medicare Other | Source: Ambulatory Visit | Attending: Vascular Surgery | Admitting: Vascular Surgery

## 2011-06-04 ENCOUNTER — Ambulatory Visit (HOSPITAL_COMMUNITY): Payer: Medicare Other

## 2011-06-04 DIAGNOSIS — T82898A Other specified complication of vascular prosthetic devices, implants and grafts, initial encounter: Secondary | ICD-10-CM

## 2011-06-04 DIAGNOSIS — J449 Chronic obstructive pulmonary disease, unspecified: Secondary | ICD-10-CM | POA: Insufficient documentation

## 2011-06-04 DIAGNOSIS — Z01818 Encounter for other preprocedural examination: Secondary | ICD-10-CM | POA: Insufficient documentation

## 2011-06-04 DIAGNOSIS — N186 End stage renal disease: Secondary | ICD-10-CM

## 2011-06-04 DIAGNOSIS — Y832 Surgical operation with anastomosis, bypass or graft as the cause of abnormal reaction of the patient, or of later complication, without mention of misadventure at the time of the procedure: Secondary | ICD-10-CM | POA: Insufficient documentation

## 2011-06-04 DIAGNOSIS — N189 Chronic kidney disease, unspecified: Secondary | ICD-10-CM | POA: Insufficient documentation

## 2011-06-04 DIAGNOSIS — Z01812 Encounter for preprocedural laboratory examination: Secondary | ICD-10-CM | POA: Insufficient documentation

## 2011-06-04 DIAGNOSIS — Z01810 Encounter for preprocedural cardiovascular examination: Secondary | ICD-10-CM | POA: Insufficient documentation

## 2011-06-04 DIAGNOSIS — I770 Arteriovenous fistula, acquired: Secondary | ICD-10-CM | POA: Insufficient documentation

## 2011-06-04 DIAGNOSIS — J4489 Other specified chronic obstructive pulmonary disease: Secondary | ICD-10-CM | POA: Insufficient documentation

## 2011-06-04 DIAGNOSIS — Z940 Kidney transplant status: Secondary | ICD-10-CM | POA: Insufficient documentation

## 2011-06-04 DIAGNOSIS — I120 Hypertensive chronic kidney disease with stage 5 chronic kidney disease or end stage renal disease: Secondary | ICD-10-CM

## 2011-06-04 LAB — SURGICAL PCR SCREEN
MRSA, PCR: NEGATIVE
Staphylococcus aureus: NEGATIVE

## 2011-06-04 LAB — GLUCOSE, CAPILLARY
Glucose-Capillary: 75 mg/dL (ref 70–99)
Glucose-Capillary: 141 mg/dL — ABNORMAL HIGH (ref 70–99)

## 2011-06-04 LAB — POCT I-STAT 4, (NA,K, GLUC, HGB,HCT)
Glucose, Bld: 81 mg/dL (ref 70–99)
HCT: 41 % (ref 39.0–52.0)
Hemoglobin: 13.9 g/dL (ref 13.0–17.0)
Potassium: 3.6 mEq/L (ref 3.5–5.1)
Sodium: 142 mEq/L (ref 135–145)

## 2011-06-05 LAB — POCT I-STAT GLUCOSE
Glucose, Bld: 129 mg/dL — ABNORMAL HIGH (ref 70–99)
Operator id: 147011

## 2011-06-08 NOTE — Op Note (Signed)
  [name] [name], [name] [name] NO.:  [id number]  MEDICAL RECORD NO.:  [id number]  LOCATION:  [facility]                         FACILITY:  [facility]  PHYSICIAN:  Judeth Cornfield. Scot Dock, M.D.DATE OF BIRTH:  [birth year]  DATE OF PROCEDURE:  06/04/2011 DATE OF DISCHARGE:                              OPERATIVE REPORT   PREOPERATIVE DIAGNOSIS:  Chronic kidney disease.  POSTOPERATIVE DIAGNOSIS:  Chronic kidney disease.  PROCEDURE:  Excision of large aneurysm of left upper arm AV fistula.  SURGEON:  Judeth Cornfield. Scot Dock, MD  ASSISTANT:  Reeves Forth, RNFA  ANESTHESIA:  General.  INDICATIONS:  This is a 45 year old gentleman who had a left upper arm AV fistula placed in the years ago.  He subsequently had a kidney transplant which is functioning well and he is no longer on dialysis. He had a markedly aneurysmal fistula in the left upper arm and this had become increased in size and had developed some thinning of the skin overlying the aneurysm with some erythema, it was felt that excision was indicated.  TECHNIQUE:  The patient was taken to the operating room, received a general anesthetic.  The left upper extremity was prepped and draped in usual sterile fashion.  An elliptical incision was made overlying the aneurysm and the aneurysm was dissected free.  It was dissected free proximally and distally enough that clamps could be placed.  There was also one large branch which was ligated and divided.  The patient was then heparinized.  The aneurysm was clamped proximally and distally and then opened and was considering plicating the aneurysm to salvage the fistula, however, the aneurysm had significant thrombus and this was likely the source of the erythema with the phlebitis, the more proximal vein also had significant thrombus and was degenerative and I did not think this was salvageable.  For this reason, the distal end of the vein was oversewn with a 5-0 Prolene suture.   Proximal end was ligated with two 2-0 silk ties.  Hemostasis was obtained to the wound.  The wound was then closed with two deep layers of 3-0 Vicryl, and the skin closed with a 4-0 subcuticular stitch.  Sterile dressing was applied.  The patient tolerated the procedure well, was transferred to recovery room in stable condition.  All needle and sponge counts were correct.    Judeth Cornfield. Scot Dock, M.D.    CSD/MEDQ  D:  06/04/2011  T:  06/04/2011  Job:  [id number]  Electronically Signed by Deitra Mayo M.D. on 06/08/2011 10:27:13 AM

## 2011-06-26 ENCOUNTER — Telehealth: Payer: Self-pay | Admitting: Cardiovascular Disease

## 2011-06-26 NOTE — Telephone Encounter (Signed)
Pt due for 1 year follow up with Dr. Angelena Form. I spoke with pt and appointment made with Dr. Angelena Form on July 20, 2011 at 10:30. He will discuss use of this medication at this visit.

## 2011-06-26 NOTE — Telephone Encounter (Signed)
Left message to call back  

## 2011-06-26 NOTE — Telephone Encounter (Signed)
Pt calling wanting to talk to nurse regarding pt taking medication.   Neurology wants to know if pt can take medication for Erectile Dysfunction. Please return pt call to advise/discuss further.

## 2011-07-06 LAB — B-NATRIURETIC PEPTIDE (CONVERTED LAB): Pro B Natriuretic peptide (BNP): 243 — ABNORMAL HIGH

## 2011-07-06 LAB — CULTURE, BLOOD (ROUTINE X 2)
Culture: NO GROWTH
Culture: NO GROWTH

## 2011-07-06 LAB — I-STAT 8, (EC8 V) (CONVERTED LAB)
Acid-base deficit: 3 — ABNORMAL HIGH
BUN: 73 — ABNORMAL HIGH
Bicarbonate: 22.1
Chloride: 105
Glucose, Bld: 267 — ABNORMAL HIGH
HCT: 42
Hemoglobin: 14.3
Operator id: 277751
Potassium: 4.6
Sodium: 135
TCO2: 23
pCO2, Ven: 37.4 — ABNORMAL LOW
pH, Ven: 7.38 — ABNORMAL HIGH

## 2011-07-06 LAB — CBC
HCT: 35.1 — ABNORMAL LOW
HCT: 35.6 — ABNORMAL LOW
HCT: 37.4 — ABNORMAL LOW
Hemoglobin: 11.7 — ABNORMAL LOW
Hemoglobin: 11.9 — ABNORMAL LOW
Hemoglobin: 12.6 — ABNORMAL LOW
MCHC: 33.3
MCHC: 33.4
MCHC: 33.8
MCV: 91.6
MCV: 92.1
MCV: 92.2
Platelets: 260
Platelets: 261
Platelets: 289
RBC: 3.82 — ABNORMAL LOW
RBC: 3.86 — ABNORMAL LOW
RBC: 4.09 — ABNORMAL LOW
RDW: 16.6 — ABNORMAL HIGH
RDW: 16.7 — ABNORMAL HIGH
RDW: 16.9 — ABNORMAL HIGH
WBC: 15.1 — ABNORMAL HIGH
WBC: 15.1 — ABNORMAL HIGH
WBC: 5.1

## 2011-07-06 LAB — URINALYSIS, ROUTINE W REFLEX MICROSCOPIC
Bilirubin Urine: NEGATIVE
Glucose, UA: 500 — AB
Ketones, ur: NEGATIVE
Nitrite: NEGATIVE
Protein, ur: >300 — AB
Specific Gravity, Urine: 1.021
Urobilinogen, UA: 0.2
pH: 6.5

## 2011-07-06 LAB — INFLUENZA A+B VIRUS AG-DIRECT(RAPID)
Inflenza A Ag: NEGATIVE
Influenza B Ag: NEGATIVE

## 2011-07-06 LAB — RENAL FUNCTION PANEL
Albumin: 3.3 — ABNORMAL LOW
BUN: 28 — ABNORMAL HIGH
CO2: 29
Calcium: 8.7
Chloride: 99
Creatinine, Ser: 5.38 — ABNORMAL HIGH
GFR calc Af Amer: 14 — ABNORMAL LOW
GFR calc non Af Amer: 12 — ABNORMAL LOW
Glucose, Bld: 216 — ABNORMAL HIGH
Phosphorus: 3.3
Potassium: 3.7
Sodium: 136

## 2011-07-06 LAB — CK TOTAL AND CKMB (NOT AT ARMC)
CK, MB: 2.8
CK, MB: 3
Relative Index: INVALID
Relative Index: INVALID
Total CK: 57
Total CK: 74

## 2011-07-06 LAB — POCT CARDIAC MARKERS
CKMB, poc: 2.3
Myoglobin, poc: >500
Operator id: 277751
Troponin i, poc: <0.05

## 2011-07-06 LAB — CARDIAC PANEL(CRET KIN+CKTOT+MB+TROPI)
CK, MB: 3.4
Relative Index: INVALID
Total CK: 62
Troponin I: 0.06

## 2011-07-06 LAB — URINE CULTURE: Colony Count: 85000

## 2011-07-06 LAB — DIFFERENTIAL
Basophils Absolute: 0
Basophils Absolute: 0.2 — ABNORMAL HIGH
Basophils Relative: 0
Basophils Relative: 1
Eosinophils Absolute: 0
Eosinophils Absolute: 0.1
Eosinophils Relative: 0
Eosinophils Relative: 1
Lymphocytes Relative: 8 — ABNORMAL LOW
Lymphocytes Relative: 8 — ABNORMAL LOW
Lymphs Abs: 1.1
Lymphs Abs: 1.2
Monocytes Absolute: 0.8
Monocytes Absolute: 1
Monocytes Relative: 5
Monocytes Relative: 7
Neutro Abs: 12.7 — ABNORMAL HIGH
Neutro Abs: 13 — ABNORMAL HIGH
Neutrophils Relative %: 85 — ABNORMAL HIGH
Neutrophils Relative %: 86 — ABNORMAL HIGH

## 2011-07-06 LAB — BASIC METABOLIC PANEL
BUN: 73 — ABNORMAL HIGH
CO2: 22
Calcium: 9
Chloride: 101
Creatinine, Ser: 11.36 — ABNORMAL HIGH
GFR calc Af Amer: 6 — ABNORMAL LOW
GFR calc non Af Amer: 5 — ABNORMAL LOW
Glucose, Bld: 295 — ABNORMAL HIGH
Potassium: 5.7 — ABNORMAL HIGH
Sodium: 138

## 2011-07-06 LAB — POCT I-STAT CREATININE
Creatinine, Ser: 12 — ABNORMAL HIGH
Operator id: 277751

## 2011-07-06 LAB — PROTIME-INR
INR: 1.1
Prothrombin Time: 14.1

## 2011-07-06 LAB — LIPID PANEL
Cholesterol: 121
HDL: 31 — ABNORMAL LOW
LDL Cholesterol: 55
Total CHOL/HDL Ratio: 3.9
Triglycerides: 175 — ABNORMAL HIGH
VLDL: 35

## 2011-07-06 LAB — URINE MICROSCOPIC-ADD ON

## 2011-07-06 LAB — D-DIMER, QUANTITATIVE (NOT AT ARMC): D-Dimer, Quant: 2.22 — ABNORMAL HIGH

## 2011-07-06 LAB — APTT: aPTT: 29

## 2011-07-06 LAB — TROPONIN I
Troponin I: 0.05
Troponin I: 0.05

## 2011-07-06 LAB — MAGNESIUM: Magnesium: 2.4

## 2011-07-06 LAB — EXPECTORATED SPUTUM ASSESSMENT W REFEX TO RESP CULTURE

## 2011-07-06 LAB — EXPECTORATED SPUTUM ASSESSMENT W GRAM STAIN, RFLX TO RESP C

## 2011-07-07 LAB — POCT I-STAT 4, (NA,K, GLUC, HGB,HCT)
Glucose, Bld: 240 — ABNORMAL HIGH
HCT: 34 — ABNORMAL LOW
Hemoglobin: 11.6 — ABNORMAL LOW
Operator id: 181601
Potassium: 5.6 — ABNORMAL HIGH
Sodium: 135

## 2011-07-20 ENCOUNTER — Encounter: Payer: Self-pay | Admitting: Cardiovascular Disease

## 2011-07-20 ENCOUNTER — Ambulatory Visit (INDEPENDENT_AMBULATORY_CARE_PROVIDER_SITE_OTHER): Payer: Medicare Other | Admitting: Cardiovascular Disease

## 2011-07-20 VITALS — BP 114/70 | HR 100 | Resp 20 | Ht 72.0 in | Wt 315.1 lb

## 2011-07-20 DIAGNOSIS — R079 Chest pain, unspecified: Secondary | ICD-10-CM

## 2011-07-20 DIAGNOSIS — I119 Hypertensive heart disease without heart failure: Secondary | ICD-10-CM

## 2011-07-20 DIAGNOSIS — F528 Other sexual dysfunction not due to a substance or known physiological condition: Secondary | ICD-10-CM

## 2011-07-20 MED ORDER — SILDENAFIL CITRATE 100 MG PO TABS: 50.0000 mg | ORAL_TABLET | Freq: Every day | ORAL | Status: DC | PRN

## 2011-07-20 NOTE — Progress Notes (Signed)
History of Present Illness:45 yo [name] male with history of IDDM, HTN, hyperlipidemia, obesity and ESRD s/p kidney transpant in 2009 at [facility] in [place] who is here today for follow up. I saw him one year ago in 2011 for evaluation of chest pain. The pain was described as occurring over the right or left side of his chest and many times substernal. The pain was sharp at times and at other times dull, pressure like. Associated with fatigue but no SOB, diaphoresis or palpitations.  He is followed by Dr. Jamal Maes at [facility]. Since his renal transplant, he describes large weight gain and seems to think that his edema is more significant on his left side involving the left arm and leg. He is almost entirely immobile because of his leg weakness, swelling and pain. He told me that he has had multiple negative venous dopplers in the last two years. He described overall fatigue.   At the first visit, I ordered an echo and a [employer]. His echo showed moderate LVH c/w hypertensive heart disease. There were no severe valvular abnormalities. His stress myoview showed a defect in the anterior wall, likely artifact. Normal wall motion and no ischemia. This was a low risk study. He has had no chest heaviness, just occasional sharp chest pains. He is here today for one year follow up. He has been doing well. 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 Scot Dock in August. No chest pain lately. Breathing has been ok.   Past Medical History  Diagnosis Date  . Diabetes mellitus   . Hypertension   . Leg pain 02/05/11    with walking  . Weight gain   . Shortness of breath     when lying flat  . Diminished eyesight   . Hearing difficulty   . Chronic kidney disease     Dia;ysis MWF  . Anemia   . Nervousness   . COPD (chronic obstructive pulmonary disease)   . CHF (congestive heart failure)   . Posttraumatic stress disorder   . Allergic rhinitis      Past Surgical History  Procedure Date  . Kidney transplant   . Eye surgery   . Av fistula placement     left    Current Outpatient Prescriptions  Medication Sig Dispense Refill  . allopurinol (ZYLOPRIM) 300 MG tablet Take by mouth. Take 400mg  daily       . aspirin 325 MG tablet Take 325 mg by mouth daily.        . calcitRIOL (ROCALTROL) 0.5 MCG capsule Take 0.5 mcg by mouth 4 (four) times daily.        . clonazePAM (KLONOPIN) 0.5 MG tablet Take 0.5 mg by mouth at bedtime.        . cloNIDine (CATAPRES) 0.1 MG tablet Take 0.1 mg by mouth as needed.        . furosemide (LASIX) 40 MG tablet Take 40 mg by mouth 2 (two) times daily.        . insulin glargine (LANTUS) 100 UNIT/ML injection Inject 90 Units into the skin 3 (three) times daily.        . insulin lispro (HUMALOG) 100 UNIT/ML injection Inject 50 Units into the skin. Take 50 units five times / day (with meals and snacks)      . metolazone (ZAROXOLYN) 5 MG tablet Take 5 mg by mouth. Take 1 tablet M-W-F       .  mycophenolate (CELLCEPT) 250 MG capsule Take 250 mg by mouth. Take 3 tablets twice / day       . potassium chloride (MICRO-K) 10 MEQ CR capsule Take 10 mEq by mouth. Take 2 tablets twice/day       . predniSONE (DELTASONE) 5 MG tablet Take 5 mg by mouth daily.        . rosuvastatin (CRESTOR) 20 MG tablet Take 20 mg by mouth daily.        . tacrolimus (PROGRAF) 0.5 MG capsule Take 0.5 mg by mouth. Take 3 tabs twice / day       . Tamsulosin HCl (FLOMAX) 0.4 MG CAPS Take 0.4 mg by mouth daily.          Allergies  Allergen Reactions  . Ace Inhibitors     REACTION: cough  . Penicillins Nausea And Vomiting    History   Social History  . Marital Status: Divorced    Spouse Name: N/A    Number of Children: N/A  . Years of Education: N/A   Occupational History  . Not on file.   Social History Main Topics  . Smoking status: Never Smoker   . Smokeless tobacco: Not on file  . Alcohol Use: No  . Drug Use: No  .  Sexually Active:    Other Topics Concern  . Not on file   Social History Narrative  . No narrative on file    Family History  Problem Relation Age of Onset  . Hypertension Mother   . Diabetes Father     Review of Systems:  As stated in the HPI and otherwise negative.   BP 114/70  Pulse 100  Resp 20  Ht 6' (1.829 m)  Wt 315 lb 1.9 oz (142.937 kg)  BMI 42.74 kg/m2  Physical Examination: General: Well developed, well nourished, NAD HEENT: OP clear, mucus membranes moist SKIN: warm, dry. No rashes. Neuro: No focal deficits Musculoskeletal: Muscle strength 5/5 all ext Psychiatric: Mood and affect normal Neck: No JVD, no carotid bruits, no thyromegaly, no lymphadenopathy. Lungs:Clear bilaterally, no wheezes, rhonci, crackles Cardiovascular: Regular rate and rhythm. No murmurs, gallops or rubs. Abdomen:Soft. Bowel sounds present. Non-tender.  Extremities: No lower extremity edema. Pulses are 2 + in the bilateral DP/PT.  EKG: Sinus tachycardia, rate 101 bpm. PACs. Non-specific T wave changes.

## 2011-07-20 NOTE — Assessment & Plan Note (Signed)
No recurrence. No further ischemic workup.

## 2011-07-20 NOTE — Assessment & Plan Note (Signed)
Will start Viagra 50 mg po to be used as directed.

## 2011-07-20 NOTE — Assessment & Plan Note (Signed)
He has moderate LVH c/w hypertensive heart disease. BP is well controlled.

## 2011-07-20 NOTE — Patient Instructions (Signed)
Your physician wants you to follow-up in:  12 months.  You will receive a reminder letter in the mail two months in advance. If you don't receive a letter, please call our office to schedule the follow-up appointment.   

## 2011-07-21 LAB — CBC
HCT: 30.7 — ABNORMAL LOW
Hemoglobin: 10.3 — ABNORMAL LOW
MCHC: 33.6
MCV: 90.6
Platelets: 362
RBC: 3.39 — ABNORMAL LOW
RDW: 14.5 — ABNORMAL HIGH
WBC: 10.6 — ABNORMAL HIGH

## 2011-07-21 LAB — DIFFERENTIAL
Basophils Absolute: 0.1
Basophils Relative: 1
Eosinophils Absolute: 0.1
Eosinophils Relative: 1
Lymphocytes Relative: 12
Lymphs Abs: 1.3
Monocytes Absolute: 0.7
Monocytes Relative: 7
Neutro Abs: 8.5 — ABNORMAL HIGH
Neutrophils Relative %: 80 — ABNORMAL HIGH

## 2011-07-21 LAB — BASIC METABOLIC PANEL
BUN: 60 — ABNORMAL HIGH
CO2: 27
Calcium: 9.2
Chloride: 93 — ABNORMAL LOW
Creatinine, Ser: 11.37 — ABNORMAL HIGH
GFR calc Af Amer: 6 — ABNORMAL LOW
GFR calc non Af Amer: 5 — ABNORMAL LOW
Glucose, Bld: 255 — ABNORMAL HIGH
Potassium: 5.1
Sodium: 131 — ABNORMAL LOW

## 2011-08-04 NOTE — H&P (Signed)
  [name] [name], [name] [name]              ACCOUNT NO.:  [id number]  MEDICAL RECORD NO.:  [id number]           PATIENT TYPE:  O  LOCATION:  FOOT                         FACILITY:  [facility]  PHYSICIAN:  Elesa Hacker, M.D.        DATE OF BIRTH:  04-Oct-1966  DATE OF ADMISSION:  01/27/2011 DATE OF DISCHARGE:                             HISTORY & PHYSICAL   CHIEF COMPLAINT:  Wound on left great toe.  HISTORY OF PRESENT ILLNESS:  This is a 45 year old male diabetic for 15 plus years status post renal transplant approximately 3 years ago.  He has profound neuropathy.  Several weeks ago, he developed a blister on the left great toe.  This broke down to a superficial skin wound which we are looking at today.  He is a patient of Dr. Jamal Maes.  [place]:  As above.  PAST SURGICAL HISTORY:  Eye surgery and renal transplant.  Cigarettes none.  Alcohol none.  ALLERGIES:  PENICILLIN causes hives.  MEDICATIONS:  Lantus, omeprazole, allopurinol, calcitriol, Flomax, CellCept, Prograf, prednisone, clonidine, metolazone, nifedipine, furosemide, Crestor, Humalog, clonazepam.  PHYSICAL EXAMINATION:  VITAL SIGNS:  Temperature 99.1, pulse 88 and regular, respirations 20, blood pressure 128/67. GENERAL APPEARANCE:  Well developed, awake and alert, very obese. EYES, EARS, NOSE, THROAT:  Normal. NECK:  Supple. CHEST:  Clear. HEART:  Regular rhythm. ABDOMEN:  Obese and not examined. EXTREMITIES:  Peripheral pulses somewhat weakened.  There is 2+ pitting edema of the entire left lower extremity, 1+ in the right lower extremity.  On the left great toe, there is a 2.0 x 1.0 very superficial thinning of skin but no complete breakdown of the skin.  This is surrounded by a small callus. NEUROLOGIC:  Complete lack of sensation of the toes and distal foot.  IMPRESSION:  Blister with incipient diabetic foot ulcer, not now present.  PLAN:  He needs to get a new pair of diabetic shoes.  We have put  some DuoDerm on the site of the previous blister.  He has been told to examine his feet every day.  In the meantime, there is no particular treatment for this toe as there is no ulceration.  I have also ordered arterial studies of the legs.  I do not believe he has any dangerous amount of vascular insufficiency.  He has been told to get a new pair of diabetic shoes, wear a healing sandal until he is able to get the new pair of shoes, and to see [place] immediately if there is any change.     Elesa Hacker, M.D.     RA/MEDQ  D:  01/27/2011  T:  01/28/2011  Job:  [id number]  cc:   Elzie Rings. Lorrene Reid, M.D.  Electronically Signed by Elesa Hacker M.D. on 08/04/2011 08:54:36 AM

## 2011-08-19 ENCOUNTER — Telehealth: Payer: Self-pay | Admitting: Cardiovascular Disease

## 2011-08-19 NOTE — Telephone Encounter (Signed)
Spoke with Rod Holler at [facility] urology and told her I would fax Dr.McAlhany's last office note where pt was started on Viagra to her at [phone].

## 2011-08-19 NOTE — Discharge Summary (Signed)
NAME:  [name] [name], [name] [name] NO.:  MEDICAL RECORD NO.:  [id number]  LOCATION:                                 FACILITY:  PHYSICIAN:  Judeth Cornfield. 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  PROCEDURES:  Excision of large aneurysm of left upper arm AV fistula on June 04, 2011.  SECONDARY DIAGNOSES: 1. Diabetes. 2. Hypertension. 3. Chronic kidney disease. 4. Chronic obstructive pulmonary disease. 5. Congestive heart failure.  HOSPITAL COURSE:  The patient was admitted as an outpatient on June 08, 2011 and underwent excision of a large aneurysm of the left upper arm AV fistula.  The patient was admitted to recovery room postoperatively and did well and was discharged to home the same day.  CONDITION ON DISCHARGE:  Good.  PERTINENT LABS:  Hemoglobin 13.9, potassium of 3.6.  DISPOSITION:  Discharge is to home.     Judeth Cornfield. Scot Dock, M.D.     CSD/MEDQ  D:  08/18/2011  T:  08/18/2011  Job:  [id number]

## 2011-08-19 NOTE — Telephone Encounter (Signed)
New message:  Pt wants to get Viagra and the office needs something in writing in order for him to get this prescription. Please fax this note attn Rod Holler

## 2011-09-15 ENCOUNTER — Encounter: Payer: Self-pay | Admitting: Cardiovascular Disease

## 2011-10-20 DIAGNOSIS — Z79899 Other long term (current) drug therapy: Secondary | ICD-10-CM | POA: Diagnosis not present

## 2011-10-20 DIAGNOSIS — Z940 Kidney transplant status: Secondary | ICD-10-CM | POA: Diagnosis not present

## 2011-10-20 DIAGNOSIS — N2581 Secondary hyperparathyroidism of renal origin: Secondary | ICD-10-CM | POA: Diagnosis not present

## 2011-11-18 DIAGNOSIS — Z940 Kidney transplant status: Secondary | ICD-10-CM | POA: Diagnosis not present

## 2011-11-18 DIAGNOSIS — Z79899 Other long term (current) drug therapy: Secondary | ICD-10-CM | POA: Diagnosis not present

## 2011-12-08 DIAGNOSIS — N2581 Secondary hyperparathyroidism of renal origin: Secondary | ICD-10-CM | POA: Diagnosis not present

## 2011-12-08 DIAGNOSIS — Z940 Kidney transplant status: Secondary | ICD-10-CM | POA: Diagnosis not present

## 2011-12-08 DIAGNOSIS — I10 Essential (primary) hypertension: Secondary | ICD-10-CM | POA: Diagnosis not present

## 2011-12-15 DIAGNOSIS — Z940 Kidney transplant status: Secondary | ICD-10-CM | POA: Diagnosis not present

## 2011-12-15 DIAGNOSIS — Z79899 Other long term (current) drug therapy: Secondary | ICD-10-CM | POA: Diagnosis not present

## 2012-01-15 DIAGNOSIS — E119 Type 2 diabetes mellitus without complications: Secondary | ICD-10-CM | POA: Insufficient documentation

## 2012-01-15 DIAGNOSIS — E11311 Type 2 diabetes mellitus with unspecified diabetic retinopathy with macular edema: Secondary | ICD-10-CM | POA: Diagnosis not present

## 2012-01-15 DIAGNOSIS — E113599 Type 2 diabetes mellitus with proliferative diabetic retinopathy without macular edema, unspecified eye: Secondary | ICD-10-CM | POA: Insufficient documentation

## 2012-01-15 DIAGNOSIS — E11359 Type 2 diabetes mellitus with proliferative diabetic retinopathy without macular edema: Secondary | ICD-10-CM | POA: Diagnosis not present

## 2012-01-21 DIAGNOSIS — E785 Hyperlipidemia, unspecified: Secondary | ICD-10-CM | POA: Diagnosis not present

## 2012-01-21 DIAGNOSIS — Z940 Kidney transplant status: Secondary | ICD-10-CM | POA: Diagnosis not present

## 2012-01-21 DIAGNOSIS — Z79899 Other long term (current) drug therapy: Secondary | ICD-10-CM | POA: Diagnosis not present

## 2012-01-21 DIAGNOSIS — E119 Type 2 diabetes mellitus without complications: Secondary | ICD-10-CM | POA: Diagnosis not present

## 2012-01-21 DIAGNOSIS — N2581 Secondary hyperparathyroidism of renal origin: Secondary | ICD-10-CM | POA: Diagnosis not present

## 2012-02-15 ENCOUNTER — Telehealth: Payer: Self-pay | Admitting: Cardiovascular Disease

## 2012-02-15 NOTE — Telephone Encounter (Signed)
Spoke with pt who states medicare will be faxing a prior authorization form for Viagra.

## 2012-02-15 NOTE — Telephone Encounter (Signed)
Please return call to patient regarding documentation which should be coming through the fax machine today, he can be reached at [phone].

## 2012-02-15 NOTE — Telephone Encounter (Signed)
Fax received from [facility]. I called and gave information needed for prior authorization to representative. Case number is [id number].  Decision will be faxed to our office in next 24-72 hours.

## 2012-02-16 ENCOUNTER — Telehealth: Payer: Self-pay | Admitting: Cardiovascular Disease

## 2012-02-16 DIAGNOSIS — Z940 Kidney transplant status: Secondary | ICD-10-CM | POA: Diagnosis not present

## 2012-02-16 DIAGNOSIS — E119 Type 2 diabetes mellitus without complications: Secondary | ICD-10-CM | POA: Diagnosis not present

## 2012-02-16 DIAGNOSIS — Z79899 Other long term (current) drug therapy: Secondary | ICD-10-CM | POA: Diagnosis not present

## 2012-02-16 NOTE — Telephone Encounter (Signed)
Spoke with pt and told him fax had been received and I was waiting on decision from insurance regarding medicine coverage.

## 2012-02-16 NOTE — Telephone Encounter (Signed)
Pt calling re if we received medicare fax from yesterday?

## 2012-02-23 DIAGNOSIS — E119 Type 2 diabetes mellitus without complications: Secondary | ICD-10-CM | POA: Diagnosis not present

## 2012-02-23 DIAGNOSIS — N2581 Secondary hyperparathyroidism of renal origin: Secondary | ICD-10-CM | POA: Diagnosis not present

## 2012-02-23 DIAGNOSIS — Z940 Kidney transplant status: Secondary | ICD-10-CM | POA: Diagnosis not present

## 2012-02-23 DIAGNOSIS — I10 Essential (primary) hypertension: Secondary | ICD-10-CM | POA: Diagnosis not present

## 2012-02-25 NOTE — Telephone Encounter (Signed)
Received paperwork that coverage has been denied as this medication is excluded from his coverage. I spoke with pt and gave him this information. He is requesting copy of denial letter and copy of office note from October indicating he may be started on Viagra sent to him.  Will mail these.

## 2012-02-26 DIAGNOSIS — E782 Mixed hyperlipidemia: Secondary | ICD-10-CM | POA: Diagnosis not present

## 2012-02-26 DIAGNOSIS — E1129 Type 2 diabetes mellitus with other diabetic kidney complication: Secondary | ICD-10-CM | POA: Diagnosis not present

## 2012-02-26 DIAGNOSIS — Z940 Kidney transplant status: Secondary | ICD-10-CM | POA: Diagnosis not present

## 2012-02-26 DIAGNOSIS — N189 Chronic kidney disease, unspecified: Secondary | ICD-10-CM | POA: Diagnosis not present

## 2012-03-01 DIAGNOSIS — D631 Anemia in chronic kidney disease: Secondary | ICD-10-CM | POA: Diagnosis not present

## 2012-03-01 DIAGNOSIS — N2581 Secondary hyperparathyroidism of renal origin: Secondary | ICD-10-CM | POA: Diagnosis not present

## 2012-03-01 DIAGNOSIS — E1165 Type 2 diabetes mellitus with hyperglycemia: Secondary | ICD-10-CM | POA: Diagnosis not present

## 2012-03-01 DIAGNOSIS — E782 Mixed hyperlipidemia: Secondary | ICD-10-CM | POA: Diagnosis not present

## 2012-03-01 DIAGNOSIS — N039 Chronic nephritic syndrome with unspecified morphologic changes: Secondary | ICD-10-CM | POA: Diagnosis not present

## 2012-03-01 DIAGNOSIS — E1129 Type 2 diabetes mellitus with other diabetic kidney complication: Secondary | ICD-10-CM | POA: Diagnosis not present

## 2012-03-01 DIAGNOSIS — I10 Essential (primary) hypertension: Secondary | ICD-10-CM | POA: Diagnosis not present

## 2012-03-02 DIAGNOSIS — E1129 Type 2 diabetes mellitus with other diabetic kidney complication: Secondary | ICD-10-CM | POA: Diagnosis not present

## 2012-03-02 DIAGNOSIS — E1165 Type 2 diabetes mellitus with hyperglycemia: Secondary | ICD-10-CM | POA: Diagnosis not present

## 2012-03-16 DIAGNOSIS — Z79899 Other long term (current) drug therapy: Secondary | ICD-10-CM | POA: Diagnosis not present

## 2012-03-16 DIAGNOSIS — E119 Type 2 diabetes mellitus without complications: Secondary | ICD-10-CM | POA: Diagnosis not present

## 2012-03-16 DIAGNOSIS — Z940 Kidney transplant status: Secondary | ICD-10-CM | POA: Diagnosis not present

## 2012-03-24 DIAGNOSIS — E1139 Type 2 diabetes mellitus with other diabetic ophthalmic complication: Secondary | ICD-10-CM | POA: Diagnosis not present

## 2012-03-24 DIAGNOSIS — R609 Edema, unspecified: Secondary | ICD-10-CM | POA: Diagnosis not present

## 2012-03-24 DIAGNOSIS — T8610 Unspecified complication of kidney transplant: Secondary | ICD-10-CM | POA: Diagnosis not present

## 2012-03-24 DIAGNOSIS — N039 Chronic nephritic syndrome with unspecified morphologic changes: Secondary | ICD-10-CM | POA: Diagnosis not present

## 2012-03-24 DIAGNOSIS — E11359 Type 2 diabetes mellitus with proliferative diabetic retinopathy without macular edema: Secondary | ICD-10-CM | POA: Diagnosis not present

## 2012-03-24 DIAGNOSIS — B349 Viral infection, unspecified: Secondary | ICD-10-CM | POA: Diagnosis not present

## 2012-03-24 DIAGNOSIS — I10 Essential (primary) hypertension: Secondary | ICD-10-CM | POA: Insufficient documentation

## 2012-03-24 DIAGNOSIS — Z940 Kidney transplant status: Secondary | ICD-10-CM | POA: Diagnosis not present

## 2012-03-24 DIAGNOSIS — D899 Disorder involving the immune mechanism, unspecified: Secondary | ICD-10-CM | POA: Diagnosis not present

## 2012-03-24 DIAGNOSIS — Z79899 Other long term (current) drug therapy: Secondary | ICD-10-CM | POA: Diagnosis not present

## 2012-03-24 DIAGNOSIS — D649 Anemia, unspecified: Secondary | ICD-10-CM | POA: Diagnosis not present

## 2012-03-24 DIAGNOSIS — Z48298 Encounter for aftercare following other organ transplant: Secondary | ICD-10-CM | POA: Diagnosis not present

## 2012-03-24 DIAGNOSIS — D509 Iron deficiency anemia, unspecified: Secondary | ICD-10-CM | POA: Diagnosis not present

## 2012-03-24 DIAGNOSIS — E119 Type 2 diabetes mellitus without complications: Secondary | ICD-10-CM | POA: Diagnosis not present

## 2012-03-28 DIAGNOSIS — N189 Chronic kidney disease, unspecified: Secondary | ICD-10-CM | POA: Diagnosis not present

## 2012-03-28 DIAGNOSIS — N469 Male infertility, unspecified: Secondary | ICD-10-CM | POA: Insufficient documentation

## 2012-03-28 DIAGNOSIS — N529 Male erectile dysfunction, unspecified: Secondary | ICD-10-CM | POA: Diagnosis not present

## 2012-03-28 DIAGNOSIS — Z9489 Other transplanted organ and tissue status: Secondary | ICD-10-CM | POA: Diagnosis not present

## 2012-04-08 DIAGNOSIS — E65 Localized adiposity: Secondary | ICD-10-CM | POA: Diagnosis not present

## 2012-04-15 DIAGNOSIS — Z940 Kidney transplant status: Secondary | ICD-10-CM | POA: Diagnosis not present

## 2012-04-15 DIAGNOSIS — Z79899 Other long term (current) drug therapy: Secondary | ICD-10-CM | POA: Diagnosis not present

## 2012-04-15 DIAGNOSIS — N2581 Secondary hyperparathyroidism of renal origin: Secondary | ICD-10-CM | POA: Diagnosis not present

## 2012-04-15 DIAGNOSIS — E119 Type 2 diabetes mellitus without complications: Secondary | ICD-10-CM | POA: Diagnosis not present

## 2012-05-12 ENCOUNTER — Other Ambulatory Visit: Payer: Self-pay | Admitting: Dermatology

## 2012-05-12 DIAGNOSIS — D485 Neoplasm of uncertain behavior of skin: Secondary | ICD-10-CM | POA: Diagnosis not present

## 2012-05-12 DIAGNOSIS — L219 Seborrheic dermatitis, unspecified: Secondary | ICD-10-CM | POA: Diagnosis not present

## 2012-05-12 DIAGNOSIS — L738 Other specified follicular disorders: Secondary | ICD-10-CM | POA: Diagnosis not present

## 2012-05-12 DIAGNOSIS — L821 Other seborrheic keratosis: Secondary | ICD-10-CM | POA: Diagnosis not present

## 2012-05-12 DIAGNOSIS — L678 Other hair color and hair shaft abnormalities: Secondary | ICD-10-CM | POA: Diagnosis not present

## 2012-05-17 DIAGNOSIS — E785 Hyperlipidemia, unspecified: Secondary | ICD-10-CM | POA: Diagnosis not present

## 2012-05-17 DIAGNOSIS — N2581 Secondary hyperparathyroidism of renal origin: Secondary | ICD-10-CM | POA: Diagnosis not present

## 2012-05-17 DIAGNOSIS — Z79899 Other long term (current) drug therapy: Secondary | ICD-10-CM | POA: Diagnosis not present

## 2012-05-17 DIAGNOSIS — Z940 Kidney transplant status: Secondary | ICD-10-CM | POA: Diagnosis not present

## 2012-05-17 DIAGNOSIS — E119 Type 2 diabetes mellitus without complications: Secondary | ICD-10-CM | POA: Diagnosis not present

## 2012-05-19 DIAGNOSIS — E119 Type 2 diabetes mellitus without complications: Secondary | ICD-10-CM | POA: Diagnosis not present

## 2012-05-19 DIAGNOSIS — Z949 Transplanted organ and tissue status, unspecified: Secondary | ICD-10-CM | POA: Diagnosis not present

## 2012-05-19 DIAGNOSIS — N529 Male erectile dysfunction, unspecified: Secondary | ICD-10-CM | POA: Diagnosis not present

## 2012-05-19 DIAGNOSIS — E669 Obesity, unspecified: Secondary | ICD-10-CM | POA: Diagnosis not present

## 2012-05-19 DIAGNOSIS — E65 Localized adiposity: Secondary | ICD-10-CM | POA: Diagnosis not present

## 2012-05-19 DIAGNOSIS — N469 Male infertility, unspecified: Secondary | ICD-10-CM | POA: Diagnosis not present

## 2012-05-19 DIAGNOSIS — I10 Essential (primary) hypertension: Secondary | ICD-10-CM | POA: Diagnosis not present

## 2012-05-25 DIAGNOSIS — D509 Iron deficiency anemia, unspecified: Secondary | ICD-10-CM | POA: Diagnosis not present

## 2012-05-25 DIAGNOSIS — Z940 Kidney transplant status: Secondary | ICD-10-CM | POA: Diagnosis not present

## 2012-05-25 DIAGNOSIS — N183 Chronic kidney disease, stage 3 unspecified: Secondary | ICD-10-CM | POA: Diagnosis not present

## 2012-05-25 DIAGNOSIS — N2581 Secondary hyperparathyroidism of renal origin: Secondary | ICD-10-CM | POA: Diagnosis not present

## 2012-06-01 DIAGNOSIS — E291 Testicular hypofunction: Secondary | ICD-10-CM | POA: Diagnosis not present

## 2012-06-02 DIAGNOSIS — E1129 Type 2 diabetes mellitus with other diabetic kidney complication: Secondary | ICD-10-CM | POA: Diagnosis not present

## 2012-06-02 DIAGNOSIS — E1165 Type 2 diabetes mellitus with hyperglycemia: Secondary | ICD-10-CM | POA: Diagnosis not present

## 2012-06-02 DIAGNOSIS — E782 Mixed hyperlipidemia: Secondary | ICD-10-CM | POA: Diagnosis not present

## 2012-06-07 DIAGNOSIS — E11359 Type 2 diabetes mellitus with proliferative diabetic retinopathy without macular edema: Secondary | ICD-10-CM | POA: Diagnosis not present

## 2012-06-07 DIAGNOSIS — E1149 Type 2 diabetes mellitus with other diabetic neurological complication: Secondary | ICD-10-CM | POA: Diagnosis not present

## 2012-06-07 DIAGNOSIS — N183 Chronic kidney disease, stage 3 unspecified: Secondary | ICD-10-CM | POA: Diagnosis not present

## 2012-06-07 DIAGNOSIS — E1139 Type 2 diabetes mellitus with other diabetic ophthalmic complication: Secondary | ICD-10-CM | POA: Diagnosis not present

## 2012-06-07 DIAGNOSIS — K3184 Gastroparesis: Secondary | ICD-10-CM | POA: Diagnosis not present

## 2012-06-07 DIAGNOSIS — E1129 Type 2 diabetes mellitus with other diabetic kidney complication: Secondary | ICD-10-CM | POA: Diagnosis not present

## 2012-06-07 DIAGNOSIS — I10 Essential (primary) hypertension: Secondary | ICD-10-CM | POA: Diagnosis not present

## 2012-06-07 DIAGNOSIS — E782 Mixed hyperlipidemia: Secondary | ICD-10-CM | POA: Diagnosis not present

## 2012-06-07 DIAGNOSIS — E1165 Type 2 diabetes mellitus with hyperglycemia: Secondary | ICD-10-CM | POA: Diagnosis not present

## 2012-06-21 DIAGNOSIS — Z940 Kidney transplant status: Secondary | ICD-10-CM | POA: Diagnosis not present

## 2012-06-21 DIAGNOSIS — Z79899 Other long term (current) drug therapy: Secondary | ICD-10-CM | POA: Diagnosis not present

## 2012-06-30 DIAGNOSIS — N390 Urinary tract infection, site not specified: Secondary | ICD-10-CM | POA: Diagnosis not present

## 2012-07-01 DIAGNOSIS — Z3189 Encounter for other procreative management: Secondary | ICD-10-CM | POA: Diagnosis not present

## 2012-07-27 DIAGNOSIS — N2581 Secondary hyperparathyroidism of renal origin: Secondary | ICD-10-CM | POA: Diagnosis not present

## 2012-07-27 DIAGNOSIS — Z79899 Other long term (current) drug therapy: Secondary | ICD-10-CM | POA: Diagnosis not present

## 2012-07-27 DIAGNOSIS — I120 Hypertensive chronic kidney disease with stage 5 chronic kidney disease or end stage renal disease: Secondary | ICD-10-CM | POA: Diagnosis not present

## 2012-07-27 DIAGNOSIS — E119 Type 2 diabetes mellitus without complications: Secondary | ICD-10-CM | POA: Diagnosis not present

## 2012-07-27 DIAGNOSIS — Z940 Kidney transplant status: Secondary | ICD-10-CM | POA: Diagnosis not present

## 2012-07-27 DIAGNOSIS — E785 Hyperlipidemia, unspecified: Secondary | ICD-10-CM | POA: Diagnosis not present

## 2012-07-29 DIAGNOSIS — N529 Male erectile dysfunction, unspecified: Secondary | ICD-10-CM | POA: Diagnosis not present

## 2012-07-29 DIAGNOSIS — N469 Male infertility, unspecified: Secondary | ICD-10-CM | POA: Diagnosis not present

## 2012-07-29 DIAGNOSIS — E11359 Type 2 diabetes mellitus with proliferative diabetic retinopathy without macular edema: Secondary | ICD-10-CM | POA: Diagnosis not present

## 2012-07-29 DIAGNOSIS — E119 Type 2 diabetes mellitus without complications: Secondary | ICD-10-CM | POA: Diagnosis not present

## 2012-07-29 DIAGNOSIS — Z9489 Other transplanted organ and tissue status: Secondary | ICD-10-CM | POA: Diagnosis not present

## 2012-07-29 DIAGNOSIS — Z79899 Other long term (current) drug therapy: Secondary | ICD-10-CM | POA: Diagnosis not present

## 2012-07-29 DIAGNOSIS — E11311 Type 2 diabetes mellitus with unspecified diabetic retinopathy with macular edema: Secondary | ICD-10-CM | POA: Diagnosis not present

## 2012-08-02 ENCOUNTER — Ambulatory Visit (INDEPENDENT_AMBULATORY_CARE_PROVIDER_SITE_OTHER): Payer: Medicare Other | Admitting: Cardiovascular Disease

## 2012-08-02 ENCOUNTER — Encounter: Payer: Self-pay | Admitting: Cardiovascular Disease

## 2012-08-02 VITALS — BP 128/66 | HR 100 | Resp 12 | Ht 73.0 in | Wt 327.0 lb

## 2012-08-02 DIAGNOSIS — I5032 Chronic diastolic (congestive) heart failure: Secondary | ICD-10-CM

## 2012-08-02 DIAGNOSIS — I509 Heart failure, unspecified: Secondary | ICD-10-CM | POA: Diagnosis not present

## 2012-08-02 DIAGNOSIS — I119 Hypertensive heart disease without heart failure: Secondary | ICD-10-CM | POA: Diagnosis not present

## 2012-08-02 NOTE — Progress Notes (Signed)
History of Present Illness: 46 yo [name] male with history of IDDM, HTN, hyperlipidemia, obesity and ESRD s/p kidney transpant in 2009 at [facility] in [place] who is here today for follow up. I saw him in 2011 for evaluation of chest pain. He was last seen here in October 2012. He is followed by Dr. Jamal Maes at [facility]. Since his renal transplant, he has described large weight gain and seemed to think that his edema is more significant on his left side involving the left arm and leg. He is almost entirely immobile because of his leg weakness, swelling and pain. He told me that he has had multiple negative venous dopplers in the past. At the first visit, I ordered an echo and a Lexiscan myoview. His echo showed moderate LVH c/w hypertensive heart disease. There were no severe valvular abnormalities. His stress myoview showed a defect in the anterior wall, likely artifact. Normal wall motion and no ischemia. This was a low risk study.  He is here today for one year follow up. He has been doing well.   No chest pain.  Breathing has been ok. He does complain of his abdomen being large. No lower ext edema.   Primary Care Physician: None Nephrology: Dr. Jamal Maes   Past Medical History  Diagnosis Date  . Diabetes mellitus   . Hypertension   . Leg pain 02/05/11    with walking  . Weight gain   . Shortness of breath     when lying flat  . Diminished eyesight   . Hearing difficulty   . Chronic kidney disease     Dia;ysis MWF  . Anemia   . Nervousness   . COPD (chronic obstructive pulmonary disease)   . CHF (congestive heart failure)   . Posttraumatic stress disorder   . Allergic rhinitis     Past Surgical History  Procedure Date  . Kidney transplant   . Eye surgery   . Av fistula placement     left    Current Outpatient Prescriptions  Medication Sig Dispense Refill  . allopurinol (ZYLOPRIM) 300 MG tablet Take by mouth. Take 400mg   daily       . aspirin 325 MG tablet Take 325 mg by mouth daily.        . calcitRIOL (ROCALTROL) 0.5 MCG capsule Take 0.5 mcg by mouth 4 (four) times daily.        . clonazePAM (KLONOPIN) 0.5 MG tablet Take 0.5 mg by mouth at bedtime.        . cloNIDine (CATAPRES) 0.1 MG tablet Take 0.1 mg by mouth as needed.        . furosemide (LASIX) 40 MG tablet Take 40 mg by mouth 2 (two) times daily.        . insulin glargine (LANTUS) 100 UNIT/ML injection Inject 90 Units into the skin 3 (three) times daily.        . insulin lispro (HUMALOG) 100 UNIT/ML injection Inject 50 Units into the skin. Take 50 units five times / day (with meals and snacks)      . metolazone (ZAROXOLYN) 5 MG tablet Take 5 mg by mouth. Take 1 tablet M-W-F       . mycophenolate (CELLCEPT) 250 MG capsule Take 250 mg by mouth. Take 3 tablets twice / day       . potassium chloride (MICRO-K) 10 MEQ CR capsule Take 10 mEq by mouth. Take 2 tablets twice/day       .  predniSONE (DELTASONE) 5 MG tablet Take 5 mg by mouth daily.        . rosuvastatin (CRESTOR) 20 MG tablet Take 20 mg by mouth daily.        . tacrolimus (PROGRAF) 0.5 MG capsule Take 0.5 mg by mouth. Take 3 tabs twice / day       . Tamsulosin HCl (FLOMAX) 0.4 MG CAPS Take 0.4 mg by mouth daily.          Allergies  Allergen Reactions  . Ace Inhibitors     REACTION: cough  . Penicillins Nausea And Vomiting    History   Social History  . Marital Status: Divorced    Spouse Name: N/A    Number of Children: N/A  . Years of Education: N/A   Occupational History  . Not on file.   Social History Main Topics  . Smoking status: Never Smoker   . Smokeless tobacco: Not on file  . Alcohol Use: No  . Drug Use: No  . Sexually Active:    Other Topics Concern  . Not on file   Social History Narrative  . No narrative on file    Family History  Problem Relation Age of Onset  . Hypertension Mother   . Diabetes Father     Review of Systems:  As stated in the HPI and  otherwise negative.   BP 128/66  Ht 6\' 1"  (1.854 m)  Wt 327 lb (148.326 kg)  BMI 43.14 kg/m2  Physical Examination: General: Well developed, well nourished, NAD HEENT: OP clear, mucus membranes moist SKIN: warm, dry. No rashes. Neuro: No focal deficits Musculoskeletal: Muscle strength 5/5 all ext Psychiatric: Mood and affect normal Neck: No JVD, no carotid bruits, no thyromegaly, no lymphadenopathy. Lungs:Clear bilaterally, no wheezes, rhonci, crackles Cardiovascular: Regular rate and rhythm. No murmurs, gallops or rubs. Abdomen:Distended. Firm. Bowel sounds present. Non-tender.  Extremities: No lower extremity edema. Pulses are 2 + in the bilateral DP/PT.  EKG: Sinus tachy with PACs. LAE. LAFB. LVH  Assessment and Plan:   1. CHEST PAIN: No recurrence.   2. HYPERTENSIVE HEART DISEASE: He has moderate LVH c/w hypertensive heart disease. BP is well controlled. His weight is up 12 pounds over last year. He may need higher doses of diuretics but will leave this to Dr. Lorrene Reid since he has a kidney transplant. Will repeat echo.   3. CHRONIC DIASTOLIC HEART FAILURE: May need higher doses of diuretics but will leave this to Nephrology. He has no change in breathing.

## 2012-08-02 NOTE — Patient Instructions (Addendum)

## 2012-08-11 ENCOUNTER — Ambulatory Visit (HOSPITAL_COMMUNITY): Payer: Medicare Other | Attending: Cardiology | Admitting: Radiology

## 2012-08-11 DIAGNOSIS — I119 Hypertensive heart disease without heart failure: Secondary | ICD-10-CM

## 2012-08-11 DIAGNOSIS — I5032 Chronic diastolic (congestive) heart failure: Secondary | ICD-10-CM

## 2012-08-11 DIAGNOSIS — I10 Essential (primary) hypertension: Secondary | ICD-10-CM | POA: Insufficient documentation

## 2012-08-11 DIAGNOSIS — E119 Type 2 diabetes mellitus without complications: Secondary | ICD-10-CM | POA: Insufficient documentation

## 2012-08-11 DIAGNOSIS — I509 Heart failure, unspecified: Secondary | ICD-10-CM | POA: Insufficient documentation

## 2012-08-11 DIAGNOSIS — J449 Chronic obstructive pulmonary disease, unspecified: Secondary | ICD-10-CM | POA: Insufficient documentation

## 2012-08-11 DIAGNOSIS — J4489 Other specified chronic obstructive pulmonary disease: Secondary | ICD-10-CM | POA: Insufficient documentation

## 2012-08-11 NOTE — Progress Notes (Signed)
Echocardiogram performed.  

## 2012-08-12 ENCOUNTER — Telehealth: Payer: Self-pay | Admitting: Cardiovascular Disease

## 2012-08-12 NOTE — Telephone Encounter (Signed)
Spoke with pt and reviewed echo results. I also discussed paperwork he dropped off in office regarding request for prescription for physical therapy.  He states physical therapy would be for his leg issues and to help with his ambulation. I told him this would usually be addressed by primary care or orthopedic MD but pt states he would like Dr. Angelena Form to write prescription.  I asked pt to have physical therapist send [place] information regarding treatment plan and we could determine at that time if our office could order.  Pt agreeable with this plan.

## 2012-08-12 NOTE — Telephone Encounter (Signed)
Patient would like return call from a nurse, he can be reached at  [phone]

## 2012-08-18 DIAGNOSIS — M79609 Pain in unspecified limb: Secondary | ICD-10-CM | POA: Diagnosis not present

## 2012-08-18 DIAGNOSIS — I10 Essential (primary) hypertension: Secondary | ICD-10-CM | POA: Diagnosis not present

## 2012-08-18 DIAGNOSIS — Z940 Kidney transplant status: Secondary | ICD-10-CM | POA: Diagnosis not present

## 2012-08-25 DIAGNOSIS — G5600 Carpal tunnel syndrome, unspecified upper limb: Secondary | ICD-10-CM | POA: Diagnosis not present

## 2012-09-16 DIAGNOSIS — G5600 Carpal tunnel syndrome, unspecified upper limb: Secondary | ICD-10-CM | POA: Diagnosis not present

## 2012-09-16 DIAGNOSIS — R233 Spontaneous ecchymoses: Secondary | ICD-10-CM | POA: Diagnosis not present

## 2012-09-20 DIAGNOSIS — N2581 Secondary hyperparathyroidism of renal origin: Secondary | ICD-10-CM | POA: Diagnosis not present

## 2012-09-20 DIAGNOSIS — N390 Urinary tract infection, site not specified: Secondary | ICD-10-CM | POA: Diagnosis not present

## 2012-09-20 DIAGNOSIS — E785 Hyperlipidemia, unspecified: Secondary | ICD-10-CM | POA: Diagnosis not present

## 2012-09-20 DIAGNOSIS — Z940 Kidney transplant status: Secondary | ICD-10-CM | POA: Diagnosis not present

## 2012-09-20 DIAGNOSIS — Z79899 Other long term (current) drug therapy: Secondary | ICD-10-CM | POA: Diagnosis not present

## 2012-09-20 DIAGNOSIS — E119 Type 2 diabetes mellitus without complications: Secondary | ICD-10-CM | POA: Diagnosis not present

## 2012-09-22 DIAGNOSIS — R269 Unspecified abnormalities of gait and mobility: Secondary | ICD-10-CM | POA: Diagnosis not present

## 2012-09-22 DIAGNOSIS — E1149 Type 2 diabetes mellitus with other diabetic neurological complication: Secondary | ICD-10-CM | POA: Diagnosis not present

## 2012-10-10 DIAGNOSIS — G4733 Obstructive sleep apnea (adult) (pediatric): Secondary | ICD-10-CM | POA: Diagnosis not present

## 2012-10-10 DIAGNOSIS — E678 Other specified hyperalimentation: Secondary | ICD-10-CM | POA: Diagnosis not present

## 2012-10-10 DIAGNOSIS — E1100 Type 2 diabetes mellitus with hyperosmolarity without nonketotic hyperglycemic-hyperosmolar coma (NKHHC): Secondary | ICD-10-CM | POA: Diagnosis not present

## 2012-10-13 DIAGNOSIS — G4733 Obstructive sleep apnea (adult) (pediatric): Secondary | ICD-10-CM | POA: Diagnosis not present

## 2012-10-13 DIAGNOSIS — E662 Morbid (severe) obesity with alveolar hypoventilation: Secondary | ICD-10-CM | POA: Diagnosis not present

## 2012-11-11 DIAGNOSIS — E785 Hyperlipidemia, unspecified: Secondary | ICD-10-CM | POA: Diagnosis not present

## 2012-11-11 DIAGNOSIS — E119 Type 2 diabetes mellitus without complications: Secondary | ICD-10-CM | POA: Diagnosis not present

## 2012-11-11 DIAGNOSIS — N2581 Secondary hyperparathyroidism of renal origin: Secondary | ICD-10-CM | POA: Diagnosis not present

## 2012-11-11 DIAGNOSIS — Z940 Kidney transplant status: Secondary | ICD-10-CM | POA: Diagnosis not present

## 2012-11-11 DIAGNOSIS — Z79899 Other long term (current) drug therapy: Secondary | ICD-10-CM | POA: Diagnosis not present

## 2012-12-08 DIAGNOSIS — Z940 Kidney transplant status: Secondary | ICD-10-CM | POA: Diagnosis not present

## 2012-12-08 DIAGNOSIS — Z79899 Other long term (current) drug therapy: Secondary | ICD-10-CM | POA: Diagnosis not present

## 2012-12-08 DIAGNOSIS — N390 Urinary tract infection, site not specified: Secondary | ICD-10-CM | POA: Diagnosis not present

## 2012-12-08 DIAGNOSIS — E785 Hyperlipidemia, unspecified: Secondary | ICD-10-CM | POA: Diagnosis not present

## 2012-12-08 DIAGNOSIS — E119 Type 2 diabetes mellitus without complications: Secondary | ICD-10-CM | POA: Diagnosis not present

## 2012-12-08 DIAGNOSIS — I10 Essential (primary) hypertension: Secondary | ICD-10-CM | POA: Diagnosis not present

## 2012-12-08 DIAGNOSIS — N2581 Secondary hyperparathyroidism of renal origin: Secondary | ICD-10-CM | POA: Diagnosis not present

## 2012-12-28 ENCOUNTER — Encounter: Payer: Self-pay | Admitting: Neurology

## 2013-01-10 DIAGNOSIS — N2581 Secondary hyperparathyroidism of renal origin: Secondary | ICD-10-CM | POA: Diagnosis not present

## 2013-01-10 DIAGNOSIS — Z79899 Other long term (current) drug therapy: Secondary | ICD-10-CM | POA: Diagnosis not present

## 2013-01-10 DIAGNOSIS — N390 Urinary tract infection, site not specified: Secondary | ICD-10-CM | POA: Diagnosis not present

## 2013-01-10 DIAGNOSIS — E785 Hyperlipidemia, unspecified: Secondary | ICD-10-CM | POA: Diagnosis not present

## 2013-01-10 DIAGNOSIS — Z940 Kidney transplant status: Secondary | ICD-10-CM | POA: Diagnosis not present

## 2013-01-10 DIAGNOSIS — E119 Type 2 diabetes mellitus without complications: Secondary | ICD-10-CM | POA: Diagnosis not present

## 2013-01-12 ENCOUNTER — Other Ambulatory Visit: Payer: Self-pay | Admitting: Neurology

## 2013-01-12 ENCOUNTER — Ambulatory Visit: Payer: Medicare Other | Attending: Neurology | Admitting: Physical Therapy

## 2013-01-12 DIAGNOSIS — R269 Unspecified abnormalities of gait and mobility: Secondary | ICD-10-CM | POA: Insufficient documentation

## 2013-01-12 DIAGNOSIS — M6281 Muscle weakness (generalized): Secondary | ICD-10-CM | POA: Insufficient documentation

## 2013-01-12 DIAGNOSIS — IMO0001 Reserved for inherently not codable concepts without codable children: Secondary | ICD-10-CM | POA: Diagnosis not present

## 2013-01-12 DIAGNOSIS — R5381 Other malaise: Secondary | ICD-10-CM | POA: Insufficient documentation

## 2013-01-17 ENCOUNTER — Ambulatory Visit: Payer: Medicare Other | Admitting: Physical Therapy

## 2013-01-17 DIAGNOSIS — R269 Unspecified abnormalities of gait and mobility: Secondary | ICD-10-CM | POA: Diagnosis not present

## 2013-01-17 DIAGNOSIS — R5381 Other malaise: Secondary | ICD-10-CM | POA: Diagnosis not present

## 2013-01-17 DIAGNOSIS — IMO0001 Reserved for inherently not codable concepts without codable children: Secondary | ICD-10-CM | POA: Diagnosis not present

## 2013-01-17 DIAGNOSIS — M6281 Muscle weakness (generalized): Secondary | ICD-10-CM | POA: Diagnosis not present

## 2013-01-19 ENCOUNTER — Ambulatory Visit: Payer: Medicare Other | Admitting: Physical Therapy

## 2013-01-19 DIAGNOSIS — R269 Unspecified abnormalities of gait and mobility: Secondary | ICD-10-CM | POA: Diagnosis not present

## 2013-01-19 DIAGNOSIS — E782 Mixed hyperlipidemia: Secondary | ICD-10-CM | POA: Diagnosis not present

## 2013-01-19 DIAGNOSIS — E1129 Type 2 diabetes mellitus with other diabetic kidney complication: Secondary | ICD-10-CM | POA: Diagnosis not present

## 2013-01-19 DIAGNOSIS — R5381 Other malaise: Secondary | ICD-10-CM | POA: Diagnosis not present

## 2013-01-19 DIAGNOSIS — M6281 Muscle weakness (generalized): Secondary | ICD-10-CM | POA: Diagnosis not present

## 2013-01-19 DIAGNOSIS — E1165 Type 2 diabetes mellitus with hyperglycemia: Secondary | ICD-10-CM | POA: Diagnosis not present

## 2013-01-19 DIAGNOSIS — Z940 Kidney transplant status: Secondary | ICD-10-CM | POA: Diagnosis not present

## 2013-01-19 DIAGNOSIS — IMO0001 Reserved for inherently not codable concepts without codable children: Secondary | ICD-10-CM | POA: Diagnosis not present

## 2013-01-20 ENCOUNTER — Ambulatory Visit: Payer: Self-pay | Admitting: Neurology

## 2013-01-24 ENCOUNTER — Ambulatory Visit: Payer: Medicare Other | Admitting: Physical Therapy

## 2013-01-24 DIAGNOSIS — E785 Hyperlipidemia, unspecified: Secondary | ICD-10-CM | POA: Diagnosis not present

## 2013-01-24 DIAGNOSIS — N183 Chronic kidney disease, stage 3 unspecified: Secondary | ICD-10-CM | POA: Diagnosis not present

## 2013-01-24 DIAGNOSIS — K5900 Constipation, unspecified: Secondary | ICD-10-CM | POA: Diagnosis not present

## 2013-01-24 DIAGNOSIS — E1129 Type 2 diabetes mellitus with other diabetic kidney complication: Secondary | ICD-10-CM | POA: Diagnosis not present

## 2013-01-25 DIAGNOSIS — E11319 Type 2 diabetes mellitus with unspecified diabetic retinopathy without macular edema: Secondary | ICD-10-CM | POA: Diagnosis not present

## 2013-01-25 DIAGNOSIS — Z961 Presence of intraocular lens: Secondary | ICD-10-CM | POA: Diagnosis not present

## 2013-01-25 DIAGNOSIS — E119 Type 2 diabetes mellitus without complications: Secondary | ICD-10-CM | POA: Diagnosis not present

## 2013-01-25 DIAGNOSIS — H26499 Other secondary cataract, unspecified eye: Secondary | ICD-10-CM | POA: Diagnosis not present

## 2013-01-26 ENCOUNTER — Ambulatory Visit: Payer: Medicare Other | Admitting: Physical Therapy

## 2013-01-26 DIAGNOSIS — M6281 Muscle weakness (generalized): Secondary | ICD-10-CM | POA: Diagnosis not present

## 2013-01-26 DIAGNOSIS — R5381 Other malaise: Secondary | ICD-10-CM | POA: Diagnosis not present

## 2013-01-26 DIAGNOSIS — R269 Unspecified abnormalities of gait and mobility: Secondary | ICD-10-CM | POA: Diagnosis not present

## 2013-01-26 DIAGNOSIS — IMO0001 Reserved for inherently not codable concepts without codable children: Secondary | ICD-10-CM | POA: Diagnosis not present

## 2013-01-28 DIAGNOSIS — H26499 Other secondary cataract, unspecified eye: Secondary | ICD-10-CM | POA: Diagnosis not present

## 2013-01-30 DIAGNOSIS — H26499 Other secondary cataract, unspecified eye: Secondary | ICD-10-CM | POA: Diagnosis not present

## 2013-01-31 ENCOUNTER — Ambulatory Visit: Payer: Medicare Other | Admitting: Physical Therapy

## 2013-02-02 ENCOUNTER — Ambulatory Visit: Payer: Medicare Other | Admitting: *Deleted

## 2013-02-02 DIAGNOSIS — M6281 Muscle weakness (generalized): Secondary | ICD-10-CM | POA: Diagnosis not present

## 2013-02-02 DIAGNOSIS — R269 Unspecified abnormalities of gait and mobility: Secondary | ICD-10-CM | POA: Diagnosis not present

## 2013-02-02 DIAGNOSIS — IMO0001 Reserved for inherently not codable concepts without codable children: Secondary | ICD-10-CM | POA: Diagnosis not present

## 2013-02-02 DIAGNOSIS — R5381 Other malaise: Secondary | ICD-10-CM | POA: Diagnosis not present

## 2013-02-07 ENCOUNTER — Ambulatory Visit: Payer: Medicare Other | Admitting: Physical Therapy

## 2013-02-07 DIAGNOSIS — R5381 Other malaise: Secondary | ICD-10-CM | POA: Diagnosis not present

## 2013-02-07 DIAGNOSIS — IMO0001 Reserved for inherently not codable concepts without codable children: Secondary | ICD-10-CM | POA: Diagnosis not present

## 2013-02-07 DIAGNOSIS — R269 Unspecified abnormalities of gait and mobility: Secondary | ICD-10-CM | POA: Diagnosis not present

## 2013-02-07 DIAGNOSIS — M6281 Muscle weakness (generalized): Secondary | ICD-10-CM | POA: Diagnosis not present

## 2013-02-09 ENCOUNTER — Ambulatory Visit: Payer: Medicare Other | Attending: Neurology | Admitting: Physical Therapy

## 2013-02-09 DIAGNOSIS — R5381 Other malaise: Secondary | ICD-10-CM | POA: Insufficient documentation

## 2013-02-09 DIAGNOSIS — M6281 Muscle weakness (generalized): Secondary | ICD-10-CM | POA: Insufficient documentation

## 2013-02-09 DIAGNOSIS — IMO0001 Reserved for inherently not codable concepts without codable children: Secondary | ICD-10-CM | POA: Diagnosis not present

## 2013-02-09 DIAGNOSIS — R269 Unspecified abnormalities of gait and mobility: Secondary | ICD-10-CM | POA: Insufficient documentation

## 2013-02-14 ENCOUNTER — Ambulatory Visit: Payer: Medicare Other | Admitting: Physical Therapy

## 2013-02-16 ENCOUNTER — Ambulatory Visit: Payer: Medicare Other | Admitting: Physical Therapy

## 2013-02-20 ENCOUNTER — Ambulatory Visit: Payer: Medicare Other | Admitting: Physical Therapy

## 2013-02-23 ENCOUNTER — Ambulatory Visit: Payer: Medicare Other | Admitting: Physical Therapy

## 2013-02-24 ENCOUNTER — Ambulatory Visit: Payer: Medicare Other

## 2013-02-28 ENCOUNTER — Ambulatory Visit: Payer: Medicare Other

## 2013-03-02 ENCOUNTER — Ambulatory Visit: Payer: Medicare Other

## 2013-03-08 ENCOUNTER — Ambulatory Visit: Payer: Medicare Other | Admitting: Physical Therapy

## 2013-03-10 ENCOUNTER — Ambulatory Visit: Payer: Medicare Other | Admitting: Physical Therapy

## 2013-03-10 DIAGNOSIS — N2581 Secondary hyperparathyroidism of renal origin: Secondary | ICD-10-CM | POA: Diagnosis not present

## 2013-03-10 DIAGNOSIS — Z79899 Other long term (current) drug therapy: Secondary | ICD-10-CM | POA: Diagnosis not present

## 2013-03-10 DIAGNOSIS — E785 Hyperlipidemia, unspecified: Secondary | ICD-10-CM | POA: Diagnosis not present

## 2013-03-10 DIAGNOSIS — E119 Type 2 diabetes mellitus without complications: Secondary | ICD-10-CM | POA: Diagnosis not present

## 2013-03-10 DIAGNOSIS — Z940 Kidney transplant status: Secondary | ICD-10-CM | POA: Diagnosis not present

## 2013-03-13 DIAGNOSIS — I10 Essential (primary) hypertension: Secondary | ICD-10-CM | POA: Diagnosis not present

## 2013-03-13 DIAGNOSIS — Z940 Kidney transplant status: Secondary | ICD-10-CM | POA: Diagnosis not present

## 2013-04-07 DIAGNOSIS — E119 Type 2 diabetes mellitus without complications: Secondary | ICD-10-CM | POA: Diagnosis not present

## 2013-04-07 DIAGNOSIS — N2581 Secondary hyperparathyroidism of renal origin: Secondary | ICD-10-CM | POA: Diagnosis not present

## 2013-04-07 DIAGNOSIS — Z79899 Other long term (current) drug therapy: Secondary | ICD-10-CM | POA: Diagnosis not present

## 2013-04-07 DIAGNOSIS — Z940 Kidney transplant status: Secondary | ICD-10-CM | POA: Diagnosis not present

## 2013-04-11 ENCOUNTER — Encounter (HOSPITAL_BASED_OUTPATIENT_CLINIC_OR_DEPARTMENT_OTHER): Payer: Medicare Other

## 2013-04-12 ENCOUNTER — Ambulatory Visit: Payer: Self-pay | Admitting: Neurology

## 2013-04-21 ENCOUNTER — Telehealth: Payer: Self-pay | Admitting: Neurology

## 2013-04-21 ENCOUNTER — Ambulatory Visit: Payer: Self-pay | Admitting: Neurology

## 2013-04-21 NOTE — Telephone Encounter (Signed)
Patient does not need to see me in sleep clinic and my report to dr Jannifer Franklin documented he needs to discuss with him and y anxiety or claustrophobia treatment referral, before coming back for more sleep evaluations.

## 2013-05-11 DIAGNOSIS — E785 Hyperlipidemia, unspecified: Secondary | ICD-10-CM | POA: Diagnosis not present

## 2013-05-11 DIAGNOSIS — Z79899 Other long term (current) drug therapy: Secondary | ICD-10-CM | POA: Diagnosis not present

## 2013-05-11 DIAGNOSIS — N2581 Secondary hyperparathyroidism of renal origin: Secondary | ICD-10-CM | POA: Diagnosis not present

## 2013-05-11 DIAGNOSIS — N390 Urinary tract infection, site not specified: Secondary | ICD-10-CM | POA: Diagnosis not present

## 2013-05-11 DIAGNOSIS — E119 Type 2 diabetes mellitus without complications: Secondary | ICD-10-CM | POA: Diagnosis not present

## 2013-05-11 DIAGNOSIS — Z940 Kidney transplant status: Secondary | ICD-10-CM | POA: Diagnosis not present

## 2013-05-16 DIAGNOSIS — I10 Essential (primary) hypertension: Secondary | ICD-10-CM | POA: Diagnosis not present

## 2013-05-16 DIAGNOSIS — Z940 Kidney transplant status: Secondary | ICD-10-CM | POA: Diagnosis not present

## 2013-05-16 DIAGNOSIS — N2581 Secondary hyperparathyroidism of renal origin: Secondary | ICD-10-CM | POA: Diagnosis not present

## 2013-05-22 ENCOUNTER — Telehealth: Payer: Self-pay | Admitting: Cardiovascular Disease

## 2013-05-22 NOTE — Telephone Encounter (Signed)
Walk In pt Form " pt Dropped Off Envelope" gave to [facility]  05/22/13/KM

## 2013-05-29 ENCOUNTER — Telehealth: Payer: Self-pay | Admitting: *Deleted

## 2013-05-29 NOTE — Telephone Encounter (Signed)
Spoke with pt and told him Dr. Angelena Form had completed paperwork he dropped off in office.  Will leave at front desk for him to pick up.

## 2013-06-01 DIAGNOSIS — E782 Mixed hyperlipidemia: Secondary | ICD-10-CM | POA: Diagnosis not present

## 2013-06-01 DIAGNOSIS — E1165 Type 2 diabetes mellitus with hyperglycemia: Secondary | ICD-10-CM | POA: Diagnosis not present

## 2013-06-01 DIAGNOSIS — E1129 Type 2 diabetes mellitus with other diabetic kidney complication: Secondary | ICD-10-CM | POA: Diagnosis not present

## 2013-06-16 DIAGNOSIS — E1129 Type 2 diabetes mellitus with other diabetic kidney complication: Secondary | ICD-10-CM | POA: Diagnosis not present

## 2013-06-16 DIAGNOSIS — E11359 Type 2 diabetes mellitus with proliferative diabetic retinopathy without macular edema: Secondary | ICD-10-CM | POA: Diagnosis not present

## 2013-06-16 DIAGNOSIS — E1149 Type 2 diabetes mellitus with other diabetic neurological complication: Secondary | ICD-10-CM | POA: Diagnosis not present

## 2013-06-16 DIAGNOSIS — Z794 Long term (current) use of insulin: Secondary | ICD-10-CM | POA: Diagnosis not present

## 2013-06-16 DIAGNOSIS — K3184 Gastroparesis: Secondary | ICD-10-CM | POA: Diagnosis not present

## 2013-06-16 DIAGNOSIS — E1165 Type 2 diabetes mellitus with hyperglycemia: Secondary | ICD-10-CM | POA: Diagnosis not present

## 2013-06-16 DIAGNOSIS — N183 Chronic kidney disease, stage 3 unspecified: Secondary | ICD-10-CM | POA: Diagnosis not present

## 2013-06-16 DIAGNOSIS — R7309 Other abnormal glucose: Secondary | ICD-10-CM | POA: Diagnosis not present

## 2013-06-16 DIAGNOSIS — E1139 Type 2 diabetes mellitus with other diabetic ophthalmic complication: Secondary | ICD-10-CM | POA: Diagnosis not present

## 2013-06-27 DIAGNOSIS — Z940 Kidney transplant status: Secondary | ICD-10-CM | POA: Diagnosis not present

## 2013-06-27 DIAGNOSIS — Z79899 Other long term (current) drug therapy: Secondary | ICD-10-CM | POA: Diagnosis not present

## 2013-06-27 DIAGNOSIS — E119 Type 2 diabetes mellitus without complications: Secondary | ICD-10-CM | POA: Diagnosis not present

## 2013-06-27 DIAGNOSIS — E785 Hyperlipidemia, unspecified: Secondary | ICD-10-CM | POA: Diagnosis not present

## 2013-06-27 DIAGNOSIS — N2581 Secondary hyperparathyroidism of renal origin: Secondary | ICD-10-CM | POA: Diagnosis not present

## 2013-06-27 DIAGNOSIS — N390 Urinary tract infection, site not specified: Secondary | ICD-10-CM | POA: Diagnosis not present

## 2013-06-29 DIAGNOSIS — E109 Type 1 diabetes mellitus without complications: Secondary | ICD-10-CM | POA: Diagnosis not present

## 2013-06-29 DIAGNOSIS — H04129 Dry eye syndrome of unspecified lacrimal gland: Secondary | ICD-10-CM | POA: Diagnosis not present

## 2013-06-29 DIAGNOSIS — Z961 Presence of intraocular lens: Secondary | ICD-10-CM | POA: Diagnosis not present

## 2013-06-29 DIAGNOSIS — E1139 Type 2 diabetes mellitus with other diabetic ophthalmic complication: Secondary | ICD-10-CM | POA: Diagnosis not present

## 2013-06-29 DIAGNOSIS — E11359 Type 2 diabetes mellitus with proliferative diabetic retinopathy without macular edema: Secondary | ICD-10-CM | POA: Diagnosis not present

## 2013-08-10 DIAGNOSIS — Z79899 Other long term (current) drug therapy: Secondary | ICD-10-CM | POA: Diagnosis not present

## 2013-08-10 DIAGNOSIS — Z940 Kidney transplant status: Secondary | ICD-10-CM | POA: Diagnosis not present

## 2013-08-10 DIAGNOSIS — N390 Urinary tract infection, site not specified: Secondary | ICD-10-CM | POA: Diagnosis not present

## 2013-08-16 DIAGNOSIS — Z5181 Encounter for therapeutic drug level monitoring: Secondary | ICD-10-CM | POA: Diagnosis not present

## 2013-08-16 DIAGNOSIS — Z940 Kidney transplant status: Secondary | ICD-10-CM | POA: Diagnosis not present

## 2013-08-16 DIAGNOSIS — Z79899 Other long term (current) drug therapy: Secondary | ICD-10-CM | POA: Diagnosis not present

## 2013-08-16 DIAGNOSIS — Z48298 Encounter for aftercare following other organ transplant: Secondary | ICD-10-CM | POA: Diagnosis not present

## 2013-09-15 DIAGNOSIS — E785 Hyperlipidemia, unspecified: Secondary | ICD-10-CM | POA: Diagnosis not present

## 2013-09-15 DIAGNOSIS — E119 Type 2 diabetes mellitus without complications: Secondary | ICD-10-CM | POA: Diagnosis not present

## 2013-09-15 DIAGNOSIS — Z79899 Other long term (current) drug therapy: Secondary | ICD-10-CM | POA: Diagnosis not present

## 2013-09-15 DIAGNOSIS — Z940 Kidney transplant status: Secondary | ICD-10-CM | POA: Diagnosis not present

## 2013-09-15 DIAGNOSIS — N2581 Secondary hyperparathyroidism of renal origin: Secondary | ICD-10-CM | POA: Diagnosis not present

## 2013-09-19 DIAGNOSIS — M79609 Pain in unspecified limb: Secondary | ICD-10-CM | POA: Diagnosis not present

## 2013-09-19 DIAGNOSIS — L608 Other nail disorders: Secondary | ICD-10-CM | POA: Diagnosis not present

## 2013-09-19 DIAGNOSIS — B351 Tinea unguium: Secondary | ICD-10-CM | POA: Diagnosis not present

## 2013-09-19 DIAGNOSIS — L97509 Non-pressure chronic ulcer of other part of unspecified foot with unspecified severity: Secondary | ICD-10-CM | POA: Diagnosis not present

## 2013-09-19 DIAGNOSIS — E1059 Type 1 diabetes mellitus with other circulatory complications: Secondary | ICD-10-CM | POA: Diagnosis not present

## 2013-09-19 DIAGNOSIS — I739 Peripheral vascular disease, unspecified: Secondary | ICD-10-CM | POA: Diagnosis not present

## 2013-09-26 DIAGNOSIS — L97509 Non-pressure chronic ulcer of other part of unspecified foot with unspecified severity: Secondary | ICD-10-CM | POA: Diagnosis not present

## 2013-09-26 DIAGNOSIS — M2040 Other hammer toe(s) (acquired), unspecified foot: Secondary | ICD-10-CM | POA: Diagnosis not present

## 2013-09-27 DIAGNOSIS — E65 Localized adiposity: Secondary | ICD-10-CM | POA: Diagnosis not present

## 2013-09-28 DIAGNOSIS — M2040 Other hammer toe(s) (acquired), unspecified foot: Secondary | ICD-10-CM | POA: Diagnosis not present

## 2013-09-28 DIAGNOSIS — L97509 Non-pressure chronic ulcer of other part of unspecified foot with unspecified severity: Secondary | ICD-10-CM | POA: Diagnosis not present

## 2013-10-03 DIAGNOSIS — E11359 Type 2 diabetes mellitus with proliferative diabetic retinopathy without macular edema: Secondary | ICD-10-CM | POA: Diagnosis not present

## 2013-10-03 DIAGNOSIS — Z940 Kidney transplant status: Secondary | ICD-10-CM | POA: Diagnosis not present

## 2013-10-03 DIAGNOSIS — N184 Chronic kidney disease, stage 4 (severe): Secondary | ICD-10-CM | POA: Diagnosis not present

## 2013-10-03 DIAGNOSIS — I10 Essential (primary) hypertension: Secondary | ICD-10-CM | POA: Diagnosis not present

## 2013-10-03 DIAGNOSIS — E11311 Type 2 diabetes mellitus with unspecified diabetic retinopathy with macular edema: Secondary | ICD-10-CM | POA: Diagnosis not present

## 2013-10-03 DIAGNOSIS — Z79899 Other long term (current) drug therapy: Secondary | ICD-10-CM | POA: Diagnosis not present

## 2013-10-03 DIAGNOSIS — Z9489 Other transplanted organ and tissue status: Secondary | ICD-10-CM | POA: Diagnosis not present

## 2013-10-03 DIAGNOSIS — E119 Type 2 diabetes mellitus without complications: Secondary | ICD-10-CM | POA: Diagnosis not present

## 2013-10-12 HISTORY — PX: AV FISTULA PLACEMENT: SHX1204

## 2013-10-13 DIAGNOSIS — I10 Essential (primary) hypertension: Secondary | ICD-10-CM | POA: Diagnosis not present

## 2013-10-13 DIAGNOSIS — Z940 Kidney transplant status: Secondary | ICD-10-CM | POA: Diagnosis not present

## 2013-10-13 DIAGNOSIS — E119 Type 2 diabetes mellitus without complications: Secondary | ICD-10-CM | POA: Diagnosis not present

## 2013-10-13 DIAGNOSIS — Z794 Long term (current) use of insulin: Secondary | ICD-10-CM | POA: Diagnosis not present

## 2013-10-18 DIAGNOSIS — L02619 Cutaneous abscess of unspecified foot: Secondary | ICD-10-CM | POA: Diagnosis not present

## 2013-10-18 DIAGNOSIS — L97509 Non-pressure chronic ulcer of other part of unspecified foot with unspecified severity: Secondary | ICD-10-CM | POA: Diagnosis not present

## 2013-10-18 DIAGNOSIS — L03119 Cellulitis of unspecified part of limb: Secondary | ICD-10-CM | POA: Diagnosis not present

## 2013-10-30 DIAGNOSIS — F39 Unspecified mood [affective] disorder: Secondary | ICD-10-CM | POA: Diagnosis not present

## 2013-10-30 DIAGNOSIS — Z789 Other specified health status: Secondary | ICD-10-CM | POA: Diagnosis not present

## 2013-11-01 ENCOUNTER — Encounter (HOSPITAL_BASED_OUTPATIENT_CLINIC_OR_DEPARTMENT_OTHER): Payer: Medicare Other | Attending: General Surgery

## 2013-11-01 DIAGNOSIS — Z940 Kidney transplant status: Secondary | ICD-10-CM | POA: Diagnosis not present

## 2013-11-07 DIAGNOSIS — Z940 Kidney transplant status: Secondary | ICD-10-CM | POA: Diagnosis not present

## 2013-11-24 DIAGNOSIS — Z940 Kidney transplant status: Secondary | ICD-10-CM | POA: Diagnosis not present

## 2013-12-01 DIAGNOSIS — E1129 Type 2 diabetes mellitus with other diabetic kidney complication: Secondary | ICD-10-CM | POA: Diagnosis not present

## 2013-12-01 DIAGNOSIS — E785 Hyperlipidemia, unspecified: Secondary | ICD-10-CM | POA: Diagnosis not present

## 2013-12-01 DIAGNOSIS — E1165 Type 2 diabetes mellitus with hyperglycemia: Secondary | ICD-10-CM | POA: Diagnosis not present

## 2013-12-01 DIAGNOSIS — I10 Essential (primary) hypertension: Secondary | ICD-10-CM | POA: Diagnosis not present

## 2013-12-08 ENCOUNTER — Encounter (HOSPITAL_BASED_OUTPATIENT_CLINIC_OR_DEPARTMENT_OTHER): Payer: Medicare Other | Attending: General Surgery

## 2013-12-14 DIAGNOSIS — R7309 Other abnormal glucose: Secondary | ICD-10-CM | POA: Diagnosis not present

## 2013-12-14 DIAGNOSIS — N183 Chronic kidney disease, stage 3 unspecified: Secondary | ICD-10-CM | POA: Diagnosis not present

## 2013-12-14 DIAGNOSIS — Z79899 Other long term (current) drug therapy: Secondary | ICD-10-CM | POA: Diagnosis not present

## 2013-12-14 DIAGNOSIS — Z940 Kidney transplant status: Secondary | ICD-10-CM | POA: Diagnosis not present

## 2013-12-14 DIAGNOSIS — E1139 Type 2 diabetes mellitus with other diabetic ophthalmic complication: Secondary | ICD-10-CM | POA: Diagnosis not present

## 2013-12-14 DIAGNOSIS — Z794 Long term (current) use of insulin: Secondary | ICD-10-CM | POA: Diagnosis not present

## 2013-12-14 DIAGNOSIS — E1165 Type 2 diabetes mellitus with hyperglycemia: Secondary | ICD-10-CM | POA: Diagnosis not present

## 2013-12-14 DIAGNOSIS — E11359 Type 2 diabetes mellitus with proliferative diabetic retinopathy without macular edema: Secondary | ICD-10-CM | POA: Diagnosis not present

## 2013-12-14 DIAGNOSIS — K3184 Gastroparesis: Secondary | ICD-10-CM | POA: Diagnosis not present

## 2013-12-14 DIAGNOSIS — M109 Gout, unspecified: Secondary | ICD-10-CM | POA: Diagnosis not present

## 2013-12-14 DIAGNOSIS — E1129 Type 2 diabetes mellitus with other diabetic kidney complication: Secondary | ICD-10-CM | POA: Diagnosis not present

## 2013-12-14 DIAGNOSIS — E785 Hyperlipidemia, unspecified: Secondary | ICD-10-CM | POA: Diagnosis not present

## 2013-12-14 DIAGNOSIS — N2581 Secondary hyperparathyroidism of renal origin: Secondary | ICD-10-CM | POA: Diagnosis not present

## 2013-12-14 DIAGNOSIS — E1149 Type 2 diabetes mellitus with other diabetic neurological complication: Secondary | ICD-10-CM | POA: Diagnosis not present

## 2013-12-18 ENCOUNTER — Other Ambulatory Visit (HOSPITAL_COMMUNITY): Payer: Self-pay | Admitting: Plastic Surgery

## 2013-12-18 ENCOUNTER — Ambulatory Visit (HOSPITAL_COMMUNITY)
Admission: RE | Admit: 2013-12-18 | Discharge: 2013-12-18 | Disposition: A | Discharge status: Home / Self Care | Payer: Medicare Other | Source: Ambulatory Visit | Attending: Plastic Surgery | Admitting: Plastic Surgery

## 2013-12-18 ENCOUNTER — Encounter (HOSPITAL_BASED_OUTPATIENT_CLINIC_OR_DEPARTMENT_OTHER): Payer: Medicare Other | Attending: Plastic Surgery

## 2013-12-18 DIAGNOSIS — M869 Osteomyelitis, unspecified: Secondary | ICD-10-CM

## 2013-12-18 DIAGNOSIS — E1142 Type 2 diabetes mellitus with diabetic polyneuropathy: Secondary | ICD-10-CM | POA: Insufficient documentation

## 2013-12-18 DIAGNOSIS — E1149 Type 2 diabetes mellitus with other diabetic neurological complication: Secondary | ICD-10-CM | POA: Insufficient documentation

## 2013-12-18 DIAGNOSIS — L97509 Non-pressure chronic ulcer of other part of unspecified foot with unspecified severity: Secondary | ICD-10-CM | POA: Diagnosis not present

## 2013-12-18 DIAGNOSIS — M79609 Pain in unspecified limb: Secondary | ICD-10-CM | POA: Diagnosis not present

## 2013-12-18 LAB — GLUCOSE, CAPILLARY: Glucose-Capillary: 170 mg/dL — ABNORMAL HIGH (ref 70–99)

## 2013-12-19 NOTE — Consult Note (Signed)
[name], [name]              ACCOUNT NO.:  [id number]  MEDICAL RECORD NO.:  [id number]  LOCATION:  FOOT                         FACILITY:  [facility]  PHYSICIAN:  Irene Limbo, MD   DATE OF BIRTH:  1965/12/29  DATE OF CONSULTATION:  12/18/2013 DATE OF DISCHARGE:                                CONSULTATION   CHIEF COMPLAINT:  Right hallux ulceration.  HISTORY OF PRESENT ILLNESS:  The patient is a 48 year old ambulatory male with history of diabetes mellitus, that presents for recurrent ulceration over his right foot.  The patient states that the ulcer has been present for approximately 3 weeks' time.  He developed a secondary ulceration in the interphalangeal webspace on the right foot between his fourth and fifth toes.  He was seen by his podiatrist, who recommended a type of cream for the wound care and prescribed an antibiotic.  However, the patient states he does not want to follow up with his podiatrist for continued care as the antibiotic caused a significant rise in his creatinine.  He has been previously seen in the [place], last several months ago. He states that his present wound has healed in the past completely.  He reports he does use diabetic shoe inserts, but it appears he has developed blisters over the left lower extremity while wearing these.  PAST MEDICAL HISTORY:  Type 2 diabetes mellitus with neuropathy, diabetic retinopathy, hyperlipidemia, gout, hyperparathyroidism, congestive heart failure, COPD, obesity, obstructive sleep apnea, hypertension, and history of kidney transplant in May of 2009.  PAST SURGICAL HISTORY:  As above.  Kidney transplant, multiple AV graft and AV fistula placements, eye surgery for retinopathy.  ALLERGIES:  ACE INHIBITORS, PENICILLIN, AND ALL PORCINE PRODUCTS.  MEDICATIONS:  Allopurinol, aspirin, calcitriol, clonidine, clonazepam, Lantus, Humalog, Zaroxolyn, CellCept, Prilosec, Micro-K, Deltasone, Crestor, Prograf, and  Flomax.  SOCIAL HISTORY:  The patient is nonsmoker.  He does not work currently.  PHYSICAL EXAMINATION:  EXTREMITIES:  He has absent sensation over all tested points over the right lower extremity.  ABI in the right is documented as 1, on the left it is 0.98.  His right calf circumference is 39 cm, right ankle is 27 cm.  Left calf is 40 cm, left ankle is 28 cm.  He has palpable dorsalis pedis bilaterally.  Doppler examination was not completed.  He has dependent rubor over the left lower extremity.  Over the right great toe, there is an open wound measured at 0.8 x 0.7 x 0.8 cm.  Over the right foot webspace between fourth and fifth toe, there appears to be a nearly healed wound, that is measured as 0.1 x 0.1 x 0.1 cm.  Over the left lower extremity, there is epidermolysis present over the base of the hallux as well as the tip of the second toe.  The patient is completely anesthetic in the area and scissors were used to remove all the slough present in the base of the wound over the right great toe.  Additional excision was performed to remove all of the hyperparakeratotic tissue surrounding the wound.  We will plan to start collagen treatment over the right lower extremity, like to obtain an x- ray to evaluate  the underlying bone.  Presently, this is at least a grade 2 Wagner ulceration.  We can consider Apligraf if the wound continues to be present in the next few weeks' time after instituting local wound care.  We will plan for dry dressing over the left lower Extremity.  I have counseled him that he needs to be refitted for his diabetic inserts and to change his footwear as this is likely contributing to his epidermolysis over the left foot.  The patient reports he has an offloading shoe that he has used in the past for the right lower extremity and he will return to using this.  Plan for followup in 1 week's time.          ______________________________ Irene Limbo, MD  MBA     BT/MEDQ  D:  12/18/2013  T:  12/19/2013  Job:  [id number]

## 2013-12-25 ENCOUNTER — Encounter (HOSPITAL_BASED_OUTPATIENT_CLINIC_OR_DEPARTMENT_OTHER): Payer: Medicare Other | Attending: Plastic Surgery

## 2013-12-25 DIAGNOSIS — E1142 Type 2 diabetes mellitus with diabetic polyneuropathy: Secondary | ICD-10-CM | POA: Insufficient documentation

## 2013-12-25 DIAGNOSIS — L97509 Non-pressure chronic ulcer of other part of unspecified foot with unspecified severity: Secondary | ICD-10-CM | POA: Diagnosis not present

## 2013-12-25 DIAGNOSIS — L851 Acquired keratosis [keratoderma] palmaris et plantaris: Secondary | ICD-10-CM | POA: Diagnosis not present

## 2013-12-25 DIAGNOSIS — E1149 Type 2 diabetes mellitus with other diabetic neurological complication: Secondary | ICD-10-CM | POA: Diagnosis not present

## 2013-12-26 NOTE — Progress Notes (Signed)
[facility]  NAME:  [name] [name], [name] [name]              ACCOUNT NO.:  [id number]  MEDICAL RECORD NO.:  [id number]      DATE OF BIRTH:  1966-03-03  PHYSICIAN:  Irene Limbo, MD         VISIT DATE:                                  OFFICE VISIT   The patient presents for followup of right foot ulceration in the setting of diabetes mellitus and peripheral neuropathy.  The patient has been a former [facility] patient several years ago.  His current wound care has been collagen.  Since the last visit, he obtained x-ray, which showed no underlying bony abnormality.  He presents today in regular shoes, but states he has been ambulating in a fracture boot at home.  PHYSICAL EXAMINATION:  The wound present in the webspace of the fourth and fifth toes, completely healed. The plantar foot ulceration over the right great toe is  measured as 1.1 x 0.9 x 0.9 cm.  There is slough present at the base of the wound and this was removed with curette for selective debridement.  Scissors were also used to remove all the hyperkeratotic callus surrounding the wound.  Examination of his left lower extremity reveals unchanged dried blisters over his left second toe and this was maintained in place.  ASSESSMENT:  Grade 2 Wagner diabetic foot ulceration.  We will continue with collagen dressing.  Anticipate that the patient will benefit from Apligraf treatment in the future.  However, we will continue with local wound care and offloading maneuvers.          ______________________________ Irene Limbo, MD MBA     BT/MEDQ  D:  12/25/2013  T:  12/26/2013  Job:  [id number]

## 2013-12-28 ENCOUNTER — Encounter (INDEPENDENT_AMBULATORY_CARE_PROVIDER_SITE_OTHER): Payer: Self-pay

## 2013-12-28 ENCOUNTER — Ambulatory Visit (INDEPENDENT_AMBULATORY_CARE_PROVIDER_SITE_OTHER): Payer: Medicare Other | Admitting: Psychiatry

## 2013-12-28 DIAGNOSIS — F329 Major depressive disorder, single episode, unspecified: Secondary | ICD-10-CM

## 2013-12-28 DIAGNOSIS — F3289 Other specified depressive episodes: Secondary | ICD-10-CM

## 2013-12-28 NOTE — Progress Notes (Signed)
THERAPIST PROGRESS NOTE  Presenting Problem Chief Complaint: depression, anger/irritability  What are the main stressors in your life right now, how long? Pt. Is very sad due to recent separation from his wife of 14 years. Pt. Is on dialysis and managing complications of kidney disease.  Previous mental health services Have you ever been treated for a mental health problem, when, where, by whom? No    Are you currently seeing a therapist or counselor, counselor's name? No   Have you ever had a mental health hospitalization, how many times, length of stay? No   Have you ever been treated with medication, name, reason, response? No   Have you ever had suicidal thoughts or attempted suicide, when, how? No   Risk factors for Suicide Demographic factors:  Male and Living alone Current mental status: no suicidal ideation reported Loss factors: Loss of significant relationship-separation from his wife Historical factors: none reported Risk Reduction factors: poor risk reduction factors Clinical factors:  depression Cognitive features that contribute to risk: none reported  SUICIDE RISK:  Minimal: No identifiable suicidal ideation.  Patients presenting with no risk factors but with morbid ruminations; may be classified as minimal risk based on the severity of the depressive symptoms    Social/family history Have you been married, how many times?  Married 14 years, recently separated from his wife.  Do you have children?  none  How many pregnancies have you had?  none  Who lives in your current household? Lives alone  Military history: No   Religious/spiritual involvement:  What religion/faith base are you? deferred  Family of origin (childhood history)  Where were you born? [place] Where did you grow up? [place]  Describe the atmosphere of the household where you grew up: deferred Do you have siblings, step/half siblings, list names, relation, sex, age? Yes. Pt. Has  relatives in [place], [place], and [place]   Are your parents separated/divorced, when and why? No   Are your parents alive? No   Social supports (personal and professional): poor  Education How many grades have you completed? college graduate Did you have any problems in school, what type? No  Medications prescribed for these problems? No   Employment (financial issues) Full disability due to partial blindness  Legal history none  Trauma/Abuse history: Have you ever been exposed to any form of abuse, what type? No   Have you ever been exposed to something traumatic, describe? Yes. Pt. Witnessed beheading while living in [place] during the war.   Substance use None reported  Mental Status: General Appearance [name] [name]:  Casual Eye Contact:  Good Motor Behavior:  Normal Speech:  Normal Level of Consciousness:  Alert Mood:  Depressed Affect:  Appropriate Anxiety Level:  moderate Thought Process:  Coherent Thought Content:  WNL Perception:  Normal Judgment:  Good Insight:  Present Cognition:  wnl  Diagnosis AXIS I Mood Disorder NOS  AXIS II No diagnosis  AXIS III Past Medical History  Diagnosis Date  . Diabetes mellitus   . Hypertension   . Leg pain 02/05/11    with walking  . Weight gain   . Shortness of breath     when lying flat  . Diminished eyesight   . Hearing difficulty   . Chronic kidney disease     Dia;ysis MWF  . Anemia   . Nervousness   . COPD (chronic obstructive pulmonary disease)   . CHF (congestive heart failure)   . Posttraumatic stress disorder   . Allergic rhinitis   .  Dyslipidemia   . Morbid obesity   . Diabetic peripheral neuropathy   . Gait disorder   . Obstructive sleep apnea   . Bilateral carpal tunnel syndrome   . Anxiety disorder   . Gastroparesis   . Gout   . Diabetic retinopathy     AXIS IV other psychosocial or environmental problems  AXIS V 51-60 moderate symptoms   Plan: Referred Pt. To [facility] for  medication management.    [name] [name] 12/28/2013

## 2014-01-01 DIAGNOSIS — E1149 Type 2 diabetes mellitus with other diabetic neurological complication: Secondary | ICD-10-CM | POA: Diagnosis not present

## 2014-01-01 DIAGNOSIS — L97509 Non-pressure chronic ulcer of other part of unspecified foot with unspecified severity: Secondary | ICD-10-CM | POA: Diagnosis not present

## 2014-01-01 DIAGNOSIS — E1142 Type 2 diabetes mellitus with diabetic polyneuropathy: Secondary | ICD-10-CM | POA: Diagnosis not present

## 2014-01-01 DIAGNOSIS — L851 Acquired keratosis [keratoderma] palmaris et plantaris: Secondary | ICD-10-CM | POA: Diagnosis not present

## 2014-01-02 NOTE — Progress Notes (Signed)
[facility]  NAME:  BRACK, BAILIN                   ACCOUNT NO.:  MEDICAL RECORD NO.:  [id number]      DATE OF BIRTH:  10-Dec-1965  PHYSICIAN:  Irene Limbo, MD    VISIT DATE:  01/01/2014                                  OFFICE VISIT   CHIEF COMPLAINT:  Right foot diabetic foot ulcer grade 2.  The patient here for followup of right foot ulceration.  He is currently in [place] and using a fracture boot.  He states he does not ambulates because he has poor balance.  EXAMINATION:  Blood pressure is 136/71, pulse is 94, temperature is 97.9, blood glucose 87.  Examination of the left second toe has a dried eschar over the dorsum. On the plantar surface, there was dried epidermolysis and blister. Following removal of both of these, there appears to be completely epithelialized skin.  We will consider this wound healed.  The webspace in the fourth and fifth toe remains completely healed.  The plantar foot ulceration over the right great toe is measured as 1 x 0.5 x 0.3 cm.  This has contracted in the last visit. No debridement was performed of the wound as it appeared clean without slough. There was hyperkeratotic tissue surrounding the wound and this was excised sharply with scissors.  ASSESSMENT:  Grade 2 Wagner diabetic foot ulceration.  We will continue with Prisma.  At this point, the wound itself has been present for over 2 months' time, and he has been under local wound care here in the wound center for 3 weeks and under the care of his podiatrist for 1-2 weeks prior to that, and he has continued presence of wound.  We will plan for application of Apligraf when available.          ______________________________ Irene Limbo, MD MBA     BT/MEDQ  D:  01/01/2014  T:  01/01/2014  Job:  [id number]

## 2014-01-15 ENCOUNTER — Encounter (HOSPITAL_COMMUNITY): Payer: Self-pay | Admitting: Emergency Medicine

## 2014-01-15 ENCOUNTER — Emergency Department (HOSPITAL_COMMUNITY): Payer: Medicare Other

## 2014-01-15 ENCOUNTER — Emergency Department (HOSPITAL_COMMUNITY)
Admission: EM | Admit: 2014-01-15 | Discharge: 2014-01-16 | Disposition: A | Discharge status: Home / Self Care | Payer: Medicare Other | Attending: Emergency Medicine | Admitting: Emergency Medicine

## 2014-01-15 DIAGNOSIS — E11319 Type 2 diabetes mellitus with unspecified diabetic retinopathy without macular edema: Secondary | ICD-10-CM | POA: Diagnosis not present

## 2014-01-15 DIAGNOSIS — J449 Chronic obstructive pulmonary disease, unspecified: Secondary | ICD-10-CM | POA: Diagnosis not present

## 2014-01-15 DIAGNOSIS — Z7982 Long term (current) use of aspirin: Secondary | ICD-10-CM | POA: Insufficient documentation

## 2014-01-15 DIAGNOSIS — N289 Disorder of kidney and ureter, unspecified: Secondary | ICD-10-CM | POA: Diagnosis not present

## 2014-01-15 DIAGNOSIS — Z880 Allergy status to penicillin: Secondary | ICD-10-CM | POA: Insufficient documentation

## 2014-01-15 DIAGNOSIS — Z794 Long term (current) use of insulin: Secondary | ICD-10-CM | POA: Insufficient documentation

## 2014-01-15 DIAGNOSIS — F411 Generalized anxiety disorder: Secondary | ICD-10-CM | POA: Diagnosis not present

## 2014-01-15 DIAGNOSIS — IMO0002 Reserved for concepts with insufficient information to code with codable children: Secondary | ICD-10-CM | POA: Diagnosis not present

## 2014-01-15 DIAGNOSIS — M549 Dorsalgia, unspecified: Secondary | ICD-10-CM | POA: Insufficient documentation

## 2014-01-15 DIAGNOSIS — R079 Chest pain, unspecified: Secondary | ICD-10-CM | POA: Diagnosis not present

## 2014-01-15 DIAGNOSIS — I509 Heart failure, unspecified: Secondary | ICD-10-CM | POA: Insufficient documentation

## 2014-01-15 DIAGNOSIS — E1149 Type 2 diabetes mellitus with other diabetic neurological complication: Secondary | ICD-10-CM | POA: Diagnosis not present

## 2014-01-15 DIAGNOSIS — J4489 Other specified chronic obstructive pulmonary disease: Secondary | ICD-10-CM | POA: Insufficient documentation

## 2014-01-15 DIAGNOSIS — Z79899 Other long term (current) drug therapy: Secondary | ICD-10-CM | POA: Insufficient documentation

## 2014-01-15 DIAGNOSIS — B029 Zoster without complications: Secondary | ICD-10-CM | POA: Diagnosis not present

## 2014-01-15 DIAGNOSIS — Z8719 Personal history of other diseases of the digestive system: Secondary | ICD-10-CM | POA: Insufficient documentation

## 2014-01-15 DIAGNOSIS — N186 End stage renal disease: Secondary | ICD-10-CM | POA: Diagnosis not present

## 2014-01-15 DIAGNOSIS — I120 Hypertensive chronic kidney disease with stage 5 chronic kidney disease or end stage renal disease: Secondary | ICD-10-CM | POA: Insufficient documentation

## 2014-01-15 DIAGNOSIS — M109 Gout, unspecified: Secondary | ICD-10-CM | POA: Diagnosis not present

## 2014-01-15 DIAGNOSIS — E785 Hyperlipidemia, unspecified: Secondary | ICD-10-CM | POA: Insufficient documentation

## 2014-01-15 DIAGNOSIS — Z862 Personal history of diseases of the blood and blood-forming organs and certain disorders involving the immune mechanism: Secondary | ICD-10-CM | POA: Diagnosis not present

## 2014-01-15 DIAGNOSIS — E1139 Type 2 diabetes mellitus with other diabetic ophthalmic complication: Secondary | ICD-10-CM | POA: Insufficient documentation

## 2014-01-15 DIAGNOSIS — I10 Essential (primary) hypertension: Secondary | ICD-10-CM | POA: Diagnosis not present

## 2014-01-15 DIAGNOSIS — E1142 Type 2 diabetes mellitus with diabetic polyneuropathy: Secondary | ICD-10-CM | POA: Insufficient documentation

## 2014-01-15 DIAGNOSIS — R9431 Abnormal electrocardiogram [ECG] [EKG]: Secondary | ICD-10-CM | POA: Diagnosis not present

## 2014-01-15 DIAGNOSIS — Z992 Dependence on renal dialysis: Secondary | ICD-10-CM | POA: Diagnosis not present

## 2014-01-15 LAB — BASIC METABOLIC PANEL
BUN: 65 mg/dL — ABNORMAL HIGH (ref 6–23)
CO2: 28 mEq/L (ref 19–32)
Calcium: 8.6 mg/dL (ref 8.4–10.5)
Chloride: 99 mEq/L (ref 96–112)
Creatinine, Ser: 2.66 mg/dL — ABNORMAL HIGH (ref 0.50–1.35)
GFR calc Af Amer: 31 mL/min — ABNORMAL LOW (ref >90)
GFR calc non Af Amer: 26 mL/min — ABNORMAL LOW (ref >90)
Glucose, Bld: 104 mg/dL — ABNORMAL HIGH (ref 70–99)
Potassium: 3.4 mEq/L — ABNORMAL LOW (ref 3.7–5.3)
Sodium: 145 mEq/L (ref 137–147)

## 2014-01-15 LAB — I-STAT TROPONIN, ED: Troponin i, poc: 0.11 ng/mL (ref 0.00–0.08)

## 2014-01-15 LAB — CBC
HCT: 43.5 % (ref 39.0–52.0)
Hemoglobin: 13.8 g/dL (ref 13.0–17.0)
MCH: 27.3 pg (ref 26.0–34.0)
MCHC: 31.7 g/dL (ref 30.0–36.0)
MCV: 86 fL (ref 78.0–100.0)
Platelets: 247 10*3/uL (ref 150–400)
RBC: 5.06 MIL/uL (ref 4.22–5.81)
RDW: 16.1 % — ABNORMAL HIGH (ref 11.5–15.5)
WBC: 12.3 10*3/uL — ABNORMAL HIGH (ref 4.0–10.5)

## 2014-01-15 LAB — TROPONIN I: Troponin I: <0.3 ng/mL (ref <0.30)

## 2014-01-15 LAB — CBG MONITORING, ED: Glucose-Capillary: 98 mg/dL (ref 70–99)

## 2014-01-15 LAB — PRO B NATRIURETIC PEPTIDE: Pro B Natriuretic peptide (BNP): 1360 pg/mL — ABNORMAL HIGH (ref 0–125)

## 2014-01-15 MED ORDER — HYDROCODONE-ACETAMINOPHEN 5-325 MG PO TABS: 1.0000 | ORAL_TABLET | Freq: Two times a day (BID) | ORAL | Status: DC

## 2014-01-15 MED ORDER — FAMCICLOVIR 500 MG PO TABS: 500.0000 mg | ORAL_TABLET | Freq: Every day | ORAL | Status: DC

## 2014-01-15 NOTE — ED Provider Notes (Signed)
CSN: [id number]     Arrival date & time 01/15/14  1619 History   First MD Initiated Contact with Patient 01/15/14 1931     Chief Complaint  Patient presents with  . Shoulder Pain     (Consider location/radiation/quality/duration/timing/severity/associated sxs/prior Treatment) Patient is a 48 y.o. male presenting with shoulder pain.  Shoulder Pain Associated symptoms include chest pain. Pertinent negatives include no abdominal pain, no headaches and no shortness of breath.   patient with complaint of left back pain left anterior chest pain for 3 days. Yesterday started with a rash in the area. This is along the dermatome proximally the 6 or 7 dermatome. Patient states she's never had shingles before. Pain rated 4/10 now it was worse couple days ago was 8/10.  Past Medical History  Diagnosis Date  . Diabetes mellitus   . Hypertension   . Leg pain 02/05/11    with walking  . Weight gain   . Shortness of breath     when lying flat  . Diminished eyesight   . Hearing difficulty   . Chronic kidney disease     Dia;ysis MWF  . Anemia   . Nervousness(799.21)   . COPD (chronic obstructive pulmonary disease)   . CHF (congestive heart failure)   . Posttraumatic stress disorder   . Allergic rhinitis   . Dyslipidemia   . Morbid obesity   . Diabetic peripheral neuropathy   . Gait disorder   . Obstructive sleep apnea   . Bilateral carpal tunnel syndrome   . Anxiety disorder   . Gastroparesis   . Gout   . Diabetic retinopathy    Past Surgical History  Procedure Laterality Date  . Kidney transplant    . Eye surgery    . Av fistula placement      left  . Multiple av graft placements     Family History  Problem Relation Age of Onset  . Hypertension Mother   . Diabetes Father    History  Substance Use Topics  . Smoking status: Never Smoker   . Smokeless tobacco: Not on file  . Alcohol Use: No    Review of Systems  Constitutional: Negative for fever.  HENT: Negative for  congestion.   Eyes: Negative for redness.  Respiratory: Negative for shortness of breath.   Cardiovascular: Positive for chest pain.  Gastrointestinal: Negative for nausea, vomiting and abdominal pain.  Genitourinary: Negative for dysuria.  Musculoskeletal: Positive for back pain. Negative for neck pain.  Skin: Positive for rash.  Neurological: Negative for headaches.  Hematological: Does not bruise/bleed easily.  Psychiatric/Behavioral: Negative for confusion.      Allergies  Penicillins and Ace inhibitors  Home Medications   Current Outpatient Rx  Name  Route  Sig  Dispense  Refill  . allopurinol (ZYLOPRIM) 100 MG tablet   Oral   Take 100 mg by mouth daily. Total 400mg  daily         . allopurinol (ZYLOPRIM) 300 MG tablet   Oral   Take 300 mg by mouth daily. Take 400mg  daily         . aspirin 325 MG tablet   Oral   Take 325 mg by mouth daily.          . calcitRIOL (ROCALTROL) 0.5 MCG capsule   Oral   Take 0.5 mcg by mouth 4 (four) times daily.           . clonazePAM (KLONOPIN) 0.5 MG tablet   Oral  Take 0.5 mg by mouth 3 (three) times daily as needed for anxiety.          . cloNIDine (CATAPRES) 0.1 MG tablet   Oral   Take 0.1 mg by mouth at bedtime.          . furosemide (LASIX) 40 MG tablet   Oral   Take 80 mg by mouth 2 (two) times daily.          Marland Kitchen gabapentin (NEURONTIN) 100 MG capsule   Oral   Take 100 mg by mouth 3 (three) times daily.         . insulin glargine (LANTUS) 100 UNIT/ML injection   Subcutaneous   Inject 90 Units into the skin 3 (three) times daily.           . insulin lispro (HUMALOG) 100 UNIT/ML injection   Subcutaneous   Inject 50 Units into the skin. Take 50 units five times / day (with meals and snacks)         . metolazone (ZAROXOLYN) 5 MG tablet   Oral   Take 5 mg by mouth every Monday, Wednesday, and Friday.          . mycophenolate (CELLCEPT) 250 MG capsule   Oral   Take 750 mg by mouth 2 (two) times  daily.          Marland Kitchen omeprazole (PRILOSEC) 20 MG capsule   Oral   Take 20 mg by mouth daily.         . potassium chloride (MICRO-K) 10 MEQ CR capsule   Oral   Take 20 mEq by mouth 2 (two) times daily.          . predniSONE (DELTASONE) 5 MG tablet   Oral   Take 5 mg by mouth daily.           . rosuvastatin (CRESTOR) 20 MG tablet   Oral   Take 20 mg by mouth at bedtime.          . tacrolimus (PROGRAF) 0.5 MG capsule   Oral   Take 1-1.5 mg by mouth 2 (two) times daily. Takes 2 tabs in am, 3 tabs in evening         . Tamsulosin HCl (FLOMAX) 0.4 MG CAPS   Oral   Take 0.4 mg by mouth daily after breakfast.          . famciclovir (FAMVIR) 500 MG tablet   Oral   Take 1 tablet (500 mg total) by mouth daily.   7 tablet   0   . HYDROcodone-acetaminophen (NORCO/VICODIN) 5-325 MG per tablet   Oral   Take 1-2 tablets by mouth 2 (two) times daily.   14 tablet   0    BP 143/88  Pulse 93  Temp(Src) 98.3 F (36.8 C) (Oral)  Resp 16  SpO2 98% Physical Exam  Nursing note and vitals reviewed. Constitutional: He is oriented to person, place, and time. He appears well-developed and well-nourished. No distress.  HENT:  Head: Normocephalic and atraumatic.  Mouth/Throat: Oropharynx is clear and moist.  Eyes: Conjunctivae and EOM are normal. Pupils are equal, round, and reactive to light.  Neck: Normal range of motion. Neck supple.  Cardiovascular: Normal rate, regular rhythm and normal heart sounds.   Pulmonary/Chest: Effort normal and breath sounds normal. No respiratory distress.  Abdominal: Soft. Bowel sounds are normal. There is no tenderness.  Musculoskeletal:  Old dialysis AV fistula left arm nonfunctional.  Neurological: He is alert and oriented  to person, place, and time. No cranial nerve deficit.  Skin: Skin is warm. Rash noted. There is erythema.  Patient with fascicular rash left anterior chest 67 dermatome area does not cross over the midline. No rash on the  back area. The patient did have pain in that area.    ED Course  Procedures (including critical care time) Labs Review Labs Reviewed  CBC - Abnormal; Notable for the following:    WBC 12.3 (*)    RDW 16.1 (*)    All other components within normal limits  BASIC METABOLIC PANEL - Abnormal; Notable for the following:    Potassium 3.4 (*)    Glucose, Bld 104 (*)    BUN 65 (*)    Creatinine, Ser 2.66 (*)    GFR calc non Af Amer 26 (*)    GFR calc Af Amer 31 (*)    All other components within normal limits  PRO B NATRIURETIC PEPTIDE - Abnormal; Notable for the following:    Pro B Natriuretic peptide (BNP) 1360.0 (*)    All other components within normal limits  I-STAT TROPOININ, ED - Abnormal; Notable for the following:    Troponin i, poc 0.11 (*)    All other components within normal limits  TROPONIN I  CBG MONITORING, ED   Results for orders placed during the hospital encounter of 01/15/14  CBC      Result Value Ref Range   WBC 12.3 (*) 4.0 - 10.5 K/uL   RBC 5.06  4.22 - 5.81 MIL/uL   Hemoglobin 13.8  13.0 - 17.0 g/dL   HCT 43.5  39.0 - 52.0 %   MCV 86.0  78.0 - 100.0 fL   MCH 27.3  26.0 - 34.0 pg   MCHC 31.7  30.0 - 36.0 g/dL   RDW 16.1 (*) 11.5 - 15.5 %   Platelets 247  150 - 400 K/uL  BASIC METABOLIC PANEL      Result Value Ref Range   Sodium 145  137 - 147 mEq/L   Potassium 3.4 (*) 3.7 - 5.3 mEq/L   Chloride 99  96 - 112 mEq/L   CO2 28  19 - 32 mEq/L   Glucose, Bld 104 (*) 70 - 99 mg/dL   BUN 65 (*) 6 - 23 mg/dL   Creatinine, Ser 2.66 (*) 0.50 - 1.35 mg/dL   Calcium 8.6  8.4 - 10.5 mg/dL   GFR calc non Af Amer 26 (*) >90 mL/min   GFR calc Af Amer 31 (*) >90 mL/min  PRO B NATRIURETIC PEPTIDE      Result Value Ref Range   Pro B Natriuretic peptide (BNP) 1360.0 (*) 0 - 125 pg/mL  TROPONIN I      Result Value Ref Range   Troponin I <0.30  <0.30 ng/mL  I-STAT TROPOININ, ED      Result Value Ref Range   Troponin i, poc 0.11 (*) 0.00 - 0.08 ng/mL   Comment  NOTIFIED PHYSICIAN     Comment 3           CBG MONITORING, ED      Result Value Ref Range   Glucose-Capillary 98  70 - 99 mg/dL    Imaging Review Dg Chest 2 View  01/15/2014   CLINICAL DATA:  Left-sided chest pain and left shoulder pain for 3 days.  EXAM: CHEST  2 VIEW  COMPARISON:  06/04/2011  FINDINGS: The heart size and mediastinal contours are within normal limits. Both lungs are  clear. The visualized skeletal structures are unremarkable.  IMPRESSION: No active cardiopulmonary disease.   Electronically Signed   By: Lajean Manes M.D.   On: 01/15/2014 18:59     EKG Interpretation   Date/Time:  Monday January 15 2014 16:54:25 EDT Ventricular Rate:  90 PR Interval:  158 QRS Duration: 108 QT Interval:  422 QTC Calculation: 516 R Axis:   -72 Text Interpretation:  Normal sinus rhythm Left anterior fascicular block  Minimal voltage criteria for LVH, may be normal variant ST \\T \ T wave  abnormality, consider lateral ischemia Prolonged QT Abnormal ECG Similar  to Dec 30 2009 Confirmed by Rogene Houston  MD, [place] [phone]) on 01/15/2014  10:05:27 PM      MDM   Final diagnoses:  Shingles  Renal insufficiency    Patient with left-sided chest pain for 3 days. Yesterday started with a rash on the anterior part of his chest in a dermatome distribution consistent with herpes zoster. Now with fascicular rash there. Patient's initial troponin was elevated repeat regular troponin was negative. EKG without acute changes when compared to previous EKGs. Most of these changes are due to his left anterior fascicular block. Patient's symptoms are consistent with a zoster. His renal function was a little worse than usual and he is a transplant patient. Discussed with nephrology they felt that they BUN and creatinine changes were not significant. Patient could be discharged home and followup with them. He recommended doing antiviral probably once a day due to his GFR. Meds were adjusted accordingly.  In  addition nursing notes actually ENT tech notes stated that I was informed of that initial troponin. Patient was originally going to b pod and then was moved to the d podarea that note that I was informed of the troponin was prior to patient going to either one of the Pods. However as it turned out it was not clinically significant. In addition patient's chest x-ray was negative.  All symptoms consistent with herpes zoster.  Mervin Kung, MD 01/15/14 [phone]

## 2014-01-15 NOTE — ED Notes (Signed)
PT moved to room D-30 because of elevated tropin.

## 2014-01-15 NOTE — ED Notes (Addendum)
RN Luellen Pucker informed of pt pos. Troponin Dr. Rogene Houston also informed of pt Troponin

## 2014-01-15 NOTE — ED Notes (Signed)
Report given to receiving RN in POD-E

## 2014-01-15 NOTE — Discharge Instructions (Signed)
Discuss your a renal function status with nephrology this is reasonable. We'll start you on antiviral medication for the herpes zoster. Because of your renal function he'll take this medicine only once a day even though is normally 3 times a day. Follow up with your regular Dr. and your kidney doctors in the next few days. Return for any newer worse symptoms. Take pain medicine as directed.

## 2014-01-15 NOTE — ED Notes (Signed)
Pt reports left side chest pain and shoulder pain that started about 3 days ago. Reports some nausea with the pain but no SOB. Also reports some knots in his chest.

## 2014-01-16 LAB — CBG MONITORING, ED: Glucose-Capillary: 56 mg/dL — ABNORMAL LOW (ref 70–99)

## 2014-01-16 NOTE — ED Notes (Addendum)
[facility] called [phone] for ride home at 8am.  Attempting to leave a message.  Need to call after 8am for time.

## 2014-01-16 NOTE — ED Notes (Signed)
Patient stated he felt like his blood sugar was dropping.  CBG 56, notified Janey Greaser RN.  Patient given orange juice and peanut butter crackers, breakfast has been ordered.  Will continue to monitor.

## 2014-01-17 ENCOUNTER — Encounter (HOSPITAL_BASED_OUTPATIENT_CLINIC_OR_DEPARTMENT_OTHER): Payer: Medicare Other | Attending: General Surgery

## 2014-01-17 DIAGNOSIS — E1169 Type 2 diabetes mellitus with other specified complication: Secondary | ICD-10-CM | POA: Insufficient documentation

## 2014-01-17 DIAGNOSIS — L97509 Non-pressure chronic ulcer of other part of unspecified foot with unspecified severity: Secondary | ICD-10-CM | POA: Diagnosis not present

## 2014-01-24 DIAGNOSIS — Z940 Kidney transplant status: Secondary | ICD-10-CM | POA: Diagnosis not present

## 2014-01-24 DIAGNOSIS — E1169 Type 2 diabetes mellitus with other specified complication: Secondary | ICD-10-CM | POA: Diagnosis not present

## 2014-01-24 DIAGNOSIS — Z79899 Other long term (current) drug therapy: Secondary | ICD-10-CM | POA: Diagnosis not present

## 2014-01-24 DIAGNOSIS — L97509 Non-pressure chronic ulcer of other part of unspecified foot with unspecified severity: Secondary | ICD-10-CM | POA: Diagnosis not present

## 2014-01-26 ENCOUNTER — Ambulatory Visit (HOSPITAL_COMMUNITY): Payer: Self-pay | Admitting: Psychiatry

## 2014-01-30 ENCOUNTER — Ambulatory Visit (INDEPENDENT_AMBULATORY_CARE_PROVIDER_SITE_OTHER): Payer: Medicare Other | Admitting: Neurology

## 2014-01-30 ENCOUNTER — Encounter (INDEPENDENT_AMBULATORY_CARE_PROVIDER_SITE_OTHER): Payer: Self-pay

## 2014-01-30 ENCOUNTER — Encounter: Payer: Self-pay | Admitting: Neurology

## 2014-01-30 VITALS — BP 98/64 | HR 72

## 2014-01-30 DIAGNOSIS — E1142 Type 2 diabetes mellitus with diabetic polyneuropathy: Secondary | ICD-10-CM

## 2014-01-30 DIAGNOSIS — E1140 Type 2 diabetes mellitus with diabetic neuropathy, unspecified: Secondary | ICD-10-CM

## 2014-01-30 DIAGNOSIS — E1149 Type 2 diabetes mellitus with other diabetic neurological complication: Secondary | ICD-10-CM

## 2014-01-30 DIAGNOSIS — R269 Unspecified abnormalities of gait and mobility: Secondary | ICD-10-CM

## 2014-01-30 MED ORDER — GABAPENTIN 100 MG PO CAPS: 200.0000 mg | ORAL_CAPSULE | Freq: Three times a day (TID) | ORAL | Status: DC

## 2014-01-30 NOTE — Patient Instructions (Signed)

## 2014-01-30 NOTE — Progress Notes (Signed)
PATIENT: [name] [name] DOB: [birth year]  REASON FOR VISIT: follow up HISTORY FROM: patient  HISTORY OF PRESENT ILLNESS: [name] [name] is a 48 year old right handed male with a history of diabetes and diabetic peripheral neuropathy. He returns today for follow-up. Previous EMG studies indicated a severe peripheral neuropathy, left tardy ulnar palsy and bilateral carpal tunnel syndromes   The patient is complaining of continued pain in the left arm and left leg. He is taking gabapentin 100mg  TID and is tolerating it well, but feels that it helped initially but now it is no longer offering much benefit. He normally uses a motorized wheelchair, occasionally he will use his walker, but he hurt his toe and now has a wound. He will be going to the wound center tomorrow. He has bilateral foot drops and wears the AFO braces occasionally. Since his last visit he went to the hospital and was diagnosed with shingles, and treated with famciclovir. He now complains of a burning, stabbing pain in his back where he had shingles. He states that it does not hurt for his clothes to touch that area. It hurts randomly, starting on the back and comes around to the front over his left breast. He has to change positions to help the pain. He feels that the pain is in his "heart." The patient has sleep apnea but can not tolerate CPAP due to claustrophobia. The patient has end stage renal disease and he is being followed by Dr. Lorrene [name].   REVIEW OF SYSTEMS: Full 14 system review of systems performed and notable only for:  Constitutional: fatigue Eyes: eye itching, loss of vision, blurred vision Ear/Nose/Throat: N/A  Skin: wounds  Cardiovascular: leg swelling  Respiratory: N/A  Gastrointestinal: N/A  Genitourinary: N/A Hematology/Lymphatic: N/A  Endocrine: N/A Musculoskeletal:N/A  Allergy/Immunology: environmental allergies and food allergies Neurological: walking difficulty, weakness Psychiatric: behavior problem,  depression, nervous/anxious Sleep: insomnia, apnea, snoring   ALLERGIES: Allergies  Allergen Reactions  . Penicillins Nausea Only and Other (See Comments)    Temp changes of body  . Ace Inhibitors Other (See Comments)    REACTION: cough    HOME MEDICATIONS: Outpatient Prescriptions Prior to Visit  Medication Sig Dispense Refill  . allopurinol (ZYLOPRIM) 100 MG tablet Take 100 mg by mouth daily. Total 400mg  daily      . allopurinol (ZYLOPRIM) 300 MG tablet Take 300 mg by mouth daily. Take 400mg  daily      . aspirin 325 MG tablet Take 325 mg by mouth daily.       . calcitRIOL (ROCALTROL) 0.5 MCG capsule Take 0.5 mcg by mouth 4 (four) times daily.        . clonazePAM (KLONOPIN) 0.5 MG tablet Take 0.5 mg by mouth 3 (three) times daily as needed for anxiety.       . cloNIDine (CATAPRES) 0.1 MG tablet Take 0.1 mg by mouth at bedtime.       . famciclovir (FAMVIR) 500 MG tablet Take 1 tablet (500 mg total) by mouth daily.  7 tablet  0  . furosemide (LASIX) 40 MG tablet Take 80 mg by mouth 2 (two) times daily.       Marland Kitchen HYDROcodone-acetaminophen (NORCO/VICODIN) 5-325 MG per tablet Take 1-2 tablets by mouth 2 (two) times daily.  14 tablet  0  . insulin glargine (LANTUS) 100 UNIT/ML injection Inject 90 Units into the skin 3 (three) times daily.        . insulin lispro (HUMALOG) 100 UNIT/ML injection Inject 50 Units  into the skin. Take 50 units five times / day (with meals and snacks)      . metolazone (ZAROXOLYN) 5 MG tablet Take 5 mg by mouth every Monday, Wednesday, and Friday.       . mycophenolate (CELLCEPT) 250 MG capsule Take 750 mg by mouth 2 (two) times daily.       Marland Kitchen omeprazole (PRILOSEC) 20 MG capsule Take 20 mg by mouth daily.      . potassium chloride (MICRO-K) 10 MEQ CR capsule Take 20 mEq by mouth 2 (two) times daily.       . predniSONE (DELTASONE) 5 MG tablet Take 5 mg by mouth daily.        . rosuvastatin (CRESTOR) 20 MG tablet Take 20 mg by mouth at bedtime.       . tacrolimus  (PROGRAF) 0.5 MG capsule Take 1-1.5 mg by mouth 2 (two) times daily. Takes 2 tabs in am, 3 tabs in evening      . Tamsulosin HCl (FLOMAX) 0.4 MG CAPS Take 0.4 mg by mouth daily after breakfast.       . gabapentin (NEURONTIN) 100 MG capsule Take 100 mg by mouth 3 (three) times daily.       No facility-administered medications prior to visit.    PAST MEDICAL HISTORY: Past Medical History  Diagnosis Date  . Diabetes mellitus   . Hypertension   . Leg pain 02/05/11    with walking  . Weight gain   . Shortness of breath     when lying flat  . Diminished eyesight   . Hearing difficulty   . Chronic kidney disease     Dia;ysis MWF  . Anemia   . Nervousness(799.21)   . COPD (chronic obstructive pulmonary disease)   . CHF (congestive heart failure)   . Posttraumatic stress disorder   . Allergic rhinitis   . Dyslipidemia   . Morbid obesity   . Diabetic peripheral neuropathy   . Gait disorder   . Obstructive sleep apnea   . Bilateral carpal tunnel syndrome   . Anxiety disorder   . Gastroparesis   . Gout   . Diabetic retinopathy     PAST SURGICAL HISTORY: Past Surgical History  Procedure Laterality Date  . Kidney transplant    . Eye surgery    . Av fistula placement      left  . Multiple av graft placements      FAMILY HISTORY: Family History  Problem Relation Age of Onset  . Hypertension Mother   . Diabetes Father     SOCIAL HISTORY: History   Social History  . Marital Status: Divorced    Spouse Name: N/A    Number of Children: N/A  . Years of Education: N/A   Occupational History  . Not on file.   Social History Main Topics  . Smoking status: Never Smoker   . Smokeless tobacco: Not on file  . Alcohol Use: No  . Drug Use: No  . Sexual Activity: Not on file   Other Topics Concern  . Not on file   Social History Narrative  . No narrative on file      PHYSICAL EXAM  Filed Vitals:   01/30/14 1004  BP: 98/64  Pulse: 72   Body mass index is 0.00  kg/(m^2).  Generalized: Well developed, in no acute distress   Neurological examination  Mentation: Alert oriented to time, place, history taking. Follows all commands speech and language fluent Cranial nerve II-XII:  Extraocular movements were full, visual field were full on confrontational test. Facial sensation was normal.  Motor: The motor testing reveals 5 over 5 strength of all 4 extremities. Good symmetric motor tone is noted throughout.  Bilateral foot drops, left greater than right. Sensory: Sensory testing is intact to pinprick, soft touch, vibration sensation in upper extremities. Decreased to pinprick and vibration in lower extremities. No evidence of extinction is noted.  Coordination: Cerebellar testing reveals good finger-nose-finger and heel-to-shin bilaterally.  Gait and station: Patient is wheelchair bound. Tandem gait not attempted.  Reflexes: Deep tendon reflexes are symmetric but depressed in the upper extremities. Absent in the lower extremities.   DIAGNOSTIC DATA (LABS, IMAGING, TESTING) - I reviewed patient records, labs, notes, testing and imaging myself where available.  Lab Results  Component Value Date   WBC 12.3* 01/15/2014   HGB 13.8 01/15/2014   HCT 43.5 01/15/2014   MCV 86.0 01/15/2014   PLT 247 01/15/2014      Component Value Date/Time   NA 145 01/15/2014 1704   K 3.4* 01/15/2014 1704   CL 99 01/15/2014 1704   CO2 28 01/15/2014 1704   GLUCOSE 104* 01/15/2014 1704   BUN 65* 01/15/2014 1704   CREATININE 2.66* 01/15/2014 1704   CALCIUM 8.6 01/15/2014 1704   PROT 6.0 12/31/2009 0341   ALBUMIN 3.2* 01/01/2010 0355   AST 25 12/31/2009 0341   ALT 23 12/31/2009 0341   ALKPHOS 77 12/31/2009 0341   BILITOT 0.7 12/31/2009 0341   GFRNONAA 26* 01/15/2014 1704   GFRAA 31* 01/15/2014 1704    ASSESSMENT AND PLAN 48 y.o. year old male  has a past medical history of Diabetes mellitus; Hypertension; Leg pain (02/05/11); Weight gain; Shortness of breath; Diminished eyesight; Hearing difficulty;  Chronic kidney disease; Anemia; Nervousness(799.21); COPD (chronic obstructive pulmonary disease); CHF (congestive heart failure); Posttraumatic stress disorder; Allergic rhinitis; Dyslipidemia; Morbid obesity; Diabetic peripheral neuropathy; Gait disorder; Obstructive sleep apnea; Bilateral carpal tunnel syndrome; Anxiety disorder; Gastroparesis; Gout; and Diabetic retinopathy. here with   1. Diabetic neuropathy 2. Abnormality of gait-d/t bilateral foot drops  The patient continues to have pain in upper and lower extremities, making it difficult for him to rest at night. The patient is wheelchair-bound and uses a walker for transfers. Will increase the gabapentin to 200 mg TID. He will be seen at the wound center tomorrow for left toe wound. I encouraged the patient to use the AFO braces if he ambulates with the walker. The patient should look at his feet daily to observe for any additional wounds.    Ward Givens, MSN, NP-C 01/30/2014, 8:31 PM [facility] [address], [place], [place] [address] [phone]  Note: This document was prepared with digital dictation and possible smart phrase technology. Any transcriptional errors that result from this process are unintentional. Kathrynn Ducking

## 2014-02-01 ENCOUNTER — Ambulatory Visit (HOSPITAL_COMMUNITY): Payer: Self-pay | Admitting: Psychiatry

## 2014-02-05 ENCOUNTER — Telehealth: Payer: Self-pay | Admitting: Neurology

## 2014-02-05 DIAGNOSIS — Z9489 Other transplanted organ and tissue status: Secondary | ICD-10-CM | POA: Diagnosis not present

## 2014-02-05 DIAGNOSIS — N320 Bladder-neck obstruction: Secondary | ICD-10-CM | POA: Insufficient documentation

## 2014-02-05 DIAGNOSIS — N400 Enlarged prostate without lower urinary tract symptoms: Secondary | ICD-10-CM | POA: Diagnosis not present

## 2014-02-05 NOTE — Telephone Encounter (Signed)
Needs some pain medications. States he has not sleep in 3 days. Also states that he needs his note from the visit because he is not sure of the medication and dosages he should be taking. He has not paid for records he was screaming into the phone that his insurance company has paid [place] and he is not going to pay for records. I advised him that is not what insurance companies pay for. He continued to scream into the phone. That he wants his records and I am just trying to take him for money. I advised him I would put the message in and have someone call him and disconnected the call.

## 2014-02-05 NOTE — Telephone Encounter (Signed)
Called pt and left message to call back concerning the message that the pt left.

## 2014-02-07 ENCOUNTER — Telehealth: Payer: Self-pay | Admitting: Neurology

## 2014-02-07 DIAGNOSIS — E1169 Type 2 diabetes mellitus with other specified complication: Secondary | ICD-10-CM | POA: Diagnosis not present

## 2014-02-07 DIAGNOSIS — L97509 Non-pressure chronic ulcer of other part of unspecified foot with unspecified severity: Secondary | ICD-10-CM | POA: Diagnosis not present

## 2014-02-07 MED ORDER — HYDROCODONE-ACETAMINOPHEN 5-325 MG PO TABS: 1.0000 | ORAL_TABLET | Freq: Two times a day (BID) | ORAL | Status: DC

## 2014-02-07 MED ORDER — DULOXETINE HCL 30 MG PO CPEP: 30.0000 mg | ORAL_CAPSULE | Freq: Every day | ORAL | Status: DC

## 2014-02-07 MED ORDER — GABAPENTIN 300 MG PO CAPS: 300.0000 mg | ORAL_CAPSULE | Freq: Three times a day (TID) | ORAL | Status: DC

## 2014-02-07 NOTE — Telephone Encounter (Signed)
I called the patient, left a message, I will call back later. 

## 2014-02-07 NOTE — Telephone Encounter (Signed)
I called pt back and he is still with excrutiating pain at times.  Is taking gabapentin 200mg  po tid and is not touching this.  [phone] is his #.

## 2014-02-07 NOTE — Telephone Encounter (Signed)
Patient returning Dr. Jannifer Franklin' call. Patient stated he has a doctor's apt this afternoon and will not be available until after 4:00 today. He also updated his telephone number to be sure we had the correct one in the system.

## 2014-02-07 NOTE — Telephone Encounter (Signed)
I called patient. The patient is having ongoing discomfort. He will be increased on the gabapentin taking 300 mg 3 times daily. I will add Cymbalta 30 mg daily. The patient will be given a small prescription for hydrocodone as well.

## 2014-02-07 NOTE — Telephone Encounter (Signed)
Pt is calling back concerning the phone call from Dr. Jannifer Franklin, pt would like Dr. Jannifer Franklin to give him a call back after 4:00 pm, pt states he has an doctor appt. Pt can be reached at [phone]. Please advise

## 2014-02-08 NOTE — Telephone Encounter (Signed)
Called pt and left message informing him that his Rx was ready to be picked up at the front desk and if he has any other problems, questions or concerns to call the office. °

## 2014-02-14 ENCOUNTER — Encounter (HOSPITAL_BASED_OUTPATIENT_CLINIC_OR_DEPARTMENT_OTHER): Payer: Medicare Other | Attending: General Surgery

## 2014-02-20 DIAGNOSIS — Z940 Kidney transplant status: Secondary | ICD-10-CM | POA: Diagnosis not present

## 2014-02-20 DIAGNOSIS — E119 Type 2 diabetes mellitus without complications: Secondary | ICD-10-CM | POA: Diagnosis not present

## 2014-02-20 DIAGNOSIS — N2581 Secondary hyperparathyroidism of renal origin: Secondary | ICD-10-CM | POA: Diagnosis not present

## 2014-02-20 DIAGNOSIS — I10 Essential (primary) hypertension: Secondary | ICD-10-CM | POA: Diagnosis not present

## 2014-03-09 ENCOUNTER — Ambulatory Visit (HOSPITAL_COMMUNITY): Payer: Self-pay | Admitting: Psychiatry

## 2014-03-28 ENCOUNTER — Encounter (HOSPITAL_COMMUNITY): Payer: Self-pay | Admitting: Psychiatry

## 2014-03-28 ENCOUNTER — Ambulatory Visit (INDEPENDENT_AMBULATORY_CARE_PROVIDER_SITE_OTHER): Payer: Medicare Other | Admitting: Psychiatry

## 2014-03-28 VITALS — BP 134/74 | HR 95 | Ht 72.0 in | Wt 299.0 lb

## 2014-03-28 DIAGNOSIS — F4310 Post-traumatic stress disorder, unspecified: Secondary | ICD-10-CM | POA: Diagnosis not present

## 2014-03-28 DIAGNOSIS — N2581 Secondary hyperparathyroidism of renal origin: Secondary | ICD-10-CM | POA: Diagnosis not present

## 2014-03-28 DIAGNOSIS — Z79899 Other long term (current) drug therapy: Secondary | ICD-10-CM | POA: Diagnosis not present

## 2014-03-28 DIAGNOSIS — G4700 Insomnia, unspecified: Secondary | ICD-10-CM | POA: Diagnosis not present

## 2014-03-28 DIAGNOSIS — Z940 Kidney transplant status: Secondary | ICD-10-CM | POA: Diagnosis not present

## 2014-03-28 MED ORDER — TRAZODONE HCL 50 MG PO TABS: 50.0000 mg | ORAL_TABLET | Freq: Every day | ORAL | Status: DC

## 2014-03-28 MED ORDER — HYDROXYZINE PAMOATE 50 MG PO CAPS: 50.0000 mg | ORAL_CAPSULE | Freq: Three times a day (TID) | ORAL | Status: DC | PRN

## 2014-03-28 NOTE — Progress Notes (Signed)
Psychiatric Assessment Adult  Patient Identification:  [name] [name] Date of Evaluation:  03/28/2014 Chief Complaint: sleep disturbance History of Chief Complaint:  No chief complaint on file.   HPI Patient are 48 year old male, here for insomnia. Pt states that he just had a kidney transplant. He is in a wheelchair, and is here with CNA. He has come to [place], to leave the war in [place]. He has some PTSD from the war. He has sleep disturbance, nightmares, and flashbacks. He gets poor quality sleep, leaving him tired, irritable the next day. He became tearful, talking about it. His family is back home, and he is dysphoric from his separation with his wife. Appetite is poor as well. Mood is dysphoric, anxious. He's had these symptoms of insomnia for awhile, the past two weeks has been worse. He has multiple medical issues, and is on a lot of medical medications, inluding cymbalta for pain, as well, which is also an antidepressant. He gets up to urinate in the night and can't go back to sleep.He sees Eloise Levels for therapy. He denies SI/HI/AVH. He denies any paranoid ideations, or delusions.  Review of Systems Physical Exam  Depressive Symptoms: depressed mood, anhedonia, insomnia, psychomotor retardation, fatigue, feelings of worthlessness/guilt, difficulty concentrating, hopelessness, impaired memory, anxiety, panic attacks, loss of energy/fatigue, disturbed sleep, decreased appetite,  (Hypo) Manic Symptoms:   Elevated Mood:  No Irritable Mood:  Yes Grandiosity:  No Distractibility:  No Labiality of Mood:  Yes Delusions:  No Hallucinations:  No Impulsivity:  Yes Sexually Inappropriate Behavior:  No Financial Extravagance:  No Flight of Ideas:  No  Anxiety Symptoms: Excessive Worry:  Yes Panic Symptoms:  No Agoraphobia:  No Obsessive Compulsive: Yes, obsession, and ruminations  Symptoms: None, Specific Phobias:  No Social Anxiety:  No  Psychotic Symptoms:   Hallucinations: No None Delusions:  No Paranoia:  Yes   Ideas of Reference:  No  PTSD Symptoms: Ever had a traumatic exposure:  Yes Had a traumatic exposure in the last month:  No Re-experiencing: Yes Flashbacks Intrusive Thoughts Nightmares Hypervigilance:  Yes Hyperarousal: Yes Difficulty Concentrating Emotional Numbness/Detachment Increased Startle Response Irritability/Anger Sleep Avoidance: Yes Decreased Interest/Participation Foreshortened Future  Traumatic Brain Injury: No   Past Psychiatric History: Diagnosis: PTSD  Hospitalizations: none  Outpatient Care: yes  Substance Abuse Care: no  Self-Mutilation: no  Suicidal Attempts: no  Violent Behaviors: no   Past Medical History:   Past Medical History  Diagnosis Date  . Diabetes mellitus   . Hypertension   . Leg pain 02/05/11    with walking  . Weight gain   . Shortness of breath     when lying flat  . Diminished eyesight   . Hearing difficulty   . Chronic kidney disease     Dia;ysis MWF  . Anemia   . Nervousness(799.21)   . COPD (chronic obstructive pulmonary disease)   . CHF (congestive heart failure)   . Posttraumatic stress disorder   . Allergic rhinitis   . Dyslipidemia   . Morbid obesity   . Diabetic peripheral neuropathy   . Gait disorder   . Obstructive sleep apnea   . Bilateral carpal tunnel syndrome   . Anxiety disorder   . Gastroparesis   . Gout   . Diabetic retinopathy    History of Loss of Consciousness:  No Seizure History:  No Cardiac History:  No Allergies:   Allergies  Allergen Reactions  . Penicillins Nausea Only and Other (See Comments)  Temp changes of body  . Ace Inhibitors Other (See Comments)    REACTION: cough   Current Medications:  Current Outpatient Prescriptions  Medication Sig Dispense Refill  . allopurinol (ZYLOPRIM) 100 MG tablet Take 100 mg by mouth daily. Total 400mg  daily      . allopurinol (ZYLOPRIM) 300 MG tablet Take 300 mg by mouth daily. Take  400mg  daily      . aspirin 325 MG tablet Take 325 mg by mouth daily.       . calcitRIOL (ROCALTROL) 0.5 MCG capsule Take 0.5 mcg by mouth 4 (four) times daily.        . clonazePAM (KLONOPIN) 0.5 MG tablet Take 0.5 mg by mouth 3 (three) times daily as needed for anxiety.       . cloNIDine (CATAPRES) 0.1 MG tablet Take 0.1 mg by mouth at bedtime.       . DULoxetine (CYMBALTA) 30 MG capsule Take 1 capsule (30 mg total) by mouth daily.  90 capsule  1  . famciclovir (FAMVIR) 500 MG tablet Take 1 tablet (500 mg total) by mouth daily.  7 tablet  0  . furosemide (LASIX) 40 MG tablet Take 80 mg by mouth 2 (two) times daily.       Marland Kitchen gabapentin (NEURONTIN) 300 MG capsule Take 1 capsule (300 mg total) by mouth 3 (three) times daily.  270 capsule  3  . HYDROcodone-acetaminophen (NORCO/VICODIN) 5-325 MG per tablet Take 1-2 tablets by mouth 2 (two) times daily.  30 tablet  0  . insulin glargine (LANTUS) 100 UNIT/ML injection Inject 90 Units into the skin 3 (three) times daily.        . insulin lispro (HUMALOG) 100 UNIT/ML injection Inject 50 Units into the skin. Take 50 units five times / day (with meals and snacks)      . metolazone (ZAROXOLYN) 5 MG tablet Take 5 mg by mouth every Monday, Wednesday, and Friday.       . mycophenolate (CELLCEPT) 250 MG capsule Take 750 mg by mouth 2 (two) times daily.       Marland Kitchen omeprazole (PRILOSEC) 20 MG capsule Take 20 mg by mouth daily.      . potassium chloride (MICRO-K) 10 MEQ CR capsule Take 20 mEq by mouth 2 (two) times daily.       . predniSONE (DELTASONE) 5 MG tablet Take 5 mg by mouth daily.        . rosuvastatin (CRESTOR) 20 MG tablet Take 20 mg by mouth at bedtime.       . tacrolimus (PROGRAF) 0.5 MG capsule Take 1-1.5 mg by mouth 2 (two) times daily. Takes 2 tabs in am, 3 tabs in evening      . Tamsulosin HCl (FLOMAX) 0.4 MG CAPS Take 0.4 mg by mouth daily after breakfast.        No current facility-administered medications for this visit.    Previous Psychotropic  Medications:  Medication Dose   see list                        Substance Abuse History in the last 12 months: None Substance Age of 1st Use Last Use Amount Specific Type  Nicotine      Alcohol      Cannabis      Opiates      Cocaine      Methamphetamines      LSD      Ecstasy  Benzodiazepines      Caffeine      Inhalants      Others:                         Social History: Current Place of Residence: [place] of Birth: [place]  Family Members: lives with self; has CNA, every day for couple of hours  Marital Status:  Separated Children: no  Sons: no  Daughters: no Relationships: no Education:  masters, Engineer, technical sales and Agro-business, and Advertising account planner Problems/Performance: none Religious Beliefs/Practices: belief in God History of Abuse: emotional (none), physical (na) and sexual (na) Occupational Experiences: none Military History:  None. Legal History: none Hobbies/Interests: cooking, aromatherapy   Family History:   Family History  Problem Relation Age of Onset  . Hypertension Mother   . Diabetes Father     Mental Status Examination/Evaluation: Objective:  Appearance: Casual and Disheveled  Eye Contact::  Fair  Speech:  Garbled and Slow  Volume:  Decreased  Mood:  Dysphoric, anxious, irritable  Affect:  Blunt and Depressed  Thought Process:  Circumstantial  Orientation:  Full (Time, Place, and Person)  Thought Content:  Obsessions and Rumination  Suicidal Thoughts:  No  Homicidal Thoughts:  No  Judgement:  Impaired  Insight:  Shallow  Psychomotor Activity:  Psychomotor Retardation  Akathisia:  No  Handed:  Right  AIMS (if indicated):  AIMS: Facial and Oral Movements Muscles of Facial Expression: None, normal Lips and Perioral Area: None, normal Jaw: None, normal Tongue: None, normal,Extremity Movements Upper (arms, wrists, hands, fingers): None, normal Lower (legs, knees, ankles, toes): None, normal, Trunk Movements Neck,  shoulders, hips: None, normal, Overall Severity Severity of abnormal movements (highest score from questions above): None, normal Incapacitation due to abnormal movements: None, normal Patient's awareness of abnormal movements (rate only patient's report): No Awareness, Dental Status Current problems with teeth and/or dentures?: No Does patient usually wear dentures?: No  Assets:  Leisure Time Physical Health Resilience Social Support    Laboratory/X-Ray Psychological Evaluation(s)   NA Dr. Kelby Aline   Assessment:  Axis I: Post Traumatic Stress Disorder and Insomnia  AXIS I Post Traumatic Stress Disorder and insomnia  AXIS II Deferred  AXIS III Past Medical History  Diagnosis Date  . Diabetes mellitus   . Hypertension   . Leg pain 02/05/11    with walking  . Weight gain   . Shortness of breath     when lying flat  . Diminished eyesight   . Hearing difficulty   . Chronic kidney disease     Dia;ysis MWF  . Anemia   . Nervousness(799.21)   . COPD (chronic obstructive pulmonary disease)   . CHF (congestive heart failure)   . Posttraumatic stress disorder   . Allergic rhinitis   . Dyslipidemia   . Morbid obesity   . Diabetic peripheral neuropathy   . Gait disorder   . Obstructive sleep apnea   . Bilateral carpal tunnel syndrome   . Anxiety disorder   . Gastroparesis   . Gout   . Diabetic retinopathy      AXIS IV economic problems, educational problems, housing problems, occupational problems, other psychosocial or environmental problems, problems related to legal system/crime, problems related to social environment, problems with access to health care services and problems with primary support group  AXIS V 51-60 moderate symptoms   Treatment Plan/Recommendations:Patient are 48 year old male, here for insomnia. Pt states that he just had a  kidney transplant. He is in a wheelchair, and is here with CNA. He has come to [place], to leave the war in [place]. He has  some PTSD from the war. He has sleep disturbance, nightmares, and flashbacks. He gets poor quality sleep, leaving him tired, irritable the next day. He became tearful, talking about it. His family is back home, and he is dysphoric from his separation with his wife. Appetite is poor as well. Mood is dysphoric, anxious. He's had these symptoms of insomnia for awhile, the past two weeks has been worse. He has multiple medical issues, and is on a lot of medical medications, inluding cymbalta for pain, as well, which is also an antidepressant. He gets up to urinate in the night and can't go back to sleep.He sees Eloise Levels for therapy. He denies SI/HI/AVH. He denies any paranoid ideations, or delusions. Will trial trazodone 50 mg po hs for insomnia. Rtc in 4 weeks.     Plan of Care: meds and therapy  Laboratory:  NA  Psychotherapy: yes   Medications: trazodone 50 mg hs for insomnia  Routine PRN Medications:  No  Consultations: as needed  Safety Concerns:  no  Other:      Madison Hickman, NP 6/17/20153:20 PM

## 2014-04-05 ENCOUNTER — Encounter: Payer: Self-pay | Admitting: Neurology

## 2014-04-05 ENCOUNTER — Ambulatory Visit (INDEPENDENT_AMBULATORY_CARE_PROVIDER_SITE_OTHER): Payer: Medicare Other | Admitting: Neurology

## 2014-04-05 VITALS — BP 120/64 | HR 84 | Resp 16 | Wt 290.0 lb

## 2014-04-05 DIAGNOSIS — Z940 Kidney transplant status: Secondary | ICD-10-CM

## 2014-04-05 DIAGNOSIS — G4733 Obstructive sleep apnea (adult) (pediatric): Secondary | ICD-10-CM | POA: Diagnosis not present

## 2014-04-05 NOTE — Addendum Note (Signed)
Addended by: Larey Seat on: 04/05/2014 02:39 PM   Modules accepted: Orders

## 2014-04-05 NOTE — Progress Notes (Signed)
PATIENT: [name] [name] DOB: April 16, 1966  REASON FOR VISIT: follow up HISTORY FROM: patient  HISTORY OF PRESENT ILLNESS: [name] [name] is a 48 year old right handed male with a history of diabetes and diabetic peripheral neuropathy.  He returns today for follow-up to establish a new drivers licence.  . Previous EMG studies indicated a severe peripheral neuropathy, left tardy ulnar palsy and bilateral carpal tunnel syndromes   The patient is complaining of continued pain in the left arm and left leg. He is taking gabapentin 100mg  TID and is tolerating it well, but feels that it helped initially but now it is no longer offering much benefit. He normally uses a motorized wheelchair, occasionally he will use his walker, but he hurt his toe and now has a wound. He will be going to the wound center tomorrow. He has bilateral foot drops and wears the AFO braces occasionally. Since his last visit he went to the hospital and was diagnosed with shingles, and treated with famciclovir. He states that it does not hurt for his clothes to touch that area. It hurts randomly, starting on the back and comes around to the front over his left breast. He has to change positions to help the pain.   He feels that the pain is in his "heart." The patient has sleep apnea but can not tolerate CPAP due to claustrophobia.   The patient has end stage renal disease but underwent a successful renal transplantand , he is being followed by Dr. Lorrene Reid.   REVIEW OF SYSTEMS: Full 14 system review of systems performed and notable only for:  Constitutional: fatigue Eyes: eye itching, loss of vision, blurred vision Ear/Nose/Throat: N/A  Skin: wounds  Cardiovascular: leg swelling  Respiratory: N/A  Gastrointestinal: N/A  Genitourinary: renal transplant  Hematology/Lymphatic: N/A  Endocrine: N/A  Allergy/Immunology: environmental allergies and food allergies Neurological: walking difficulty, weakness Psychiatric: behavior  problem, depression, nervous/anxious Sleep: insomnia, apnea, snoring   ALLERGIES: Allergies  Allergen Reactions  . Penicillins Nausea Only and Other (See Comments)    Temp changes of body  . Ace Inhibitors Other (See Comments) and Cough    Other reaction(s): Cough (ALLERGY/intolerance) REACTION: cough  . Other Rash    Other reaction(s): Rash (ALLERGY/intolerance)  . Penicillin G Rash    Other reaction(s): Rash (ALLERGY/intolerance)    HOME MEDICATIONS: Outpatient Prescriptions Prior to Visit  Medication Sig Dispense Refill  . allopurinol (ZYLOPRIM) 100 MG tablet Take 100 mg by mouth daily. Total 400mg  daily      . allopurinol (ZYLOPRIM) 300 MG tablet Take 300 mg by mouth daily. Take 400mg  daily      . aspirin 325 MG tablet Take 325 mg by mouth daily.       . calcitRIOL (ROCALTROL) 0.5 MCG capsule Take 0.5 mcg by mouth 4 (four) times daily.        . clonazePAM (KLONOPIN) 0.5 MG tablet Take 0.5 mg by mouth 3 (three) times daily as needed for anxiety.       . cloNIDine (CATAPRES) 0.1 MG tablet Take 0.1 mg by mouth at bedtime.       . DULoxetine (CYMBALTA) 30 MG capsule Take 1 capsule (30 mg total) by mouth daily.  90 capsule  1  . famciclovir (FAMVIR) 500 MG tablet Take 1 tablet (500 mg total) by mouth daily.  7 tablet  0  . furosemide (LASIX) 40 MG tablet Take 80 mg by mouth 2 (two) times daily.       Marland Kitchen  gabapentin (NEURONTIN) 300 MG capsule Take 1 capsule (300 mg total) by mouth 3 (three) times daily.  270 capsule  3  . HYDROcodone-acetaminophen (NORCO/VICODIN) 5-325 MG per tablet Take 1-2 tablets by mouth 2 (two) times daily.  30 tablet  0  . insulin glargine (LANTUS) 100 UNIT/ML injection Inject 90 Units into the skin 3 (three) times daily.        . insulin lispro (HUMALOG) 100 UNIT/ML injection Inject 50 Units into the skin. Take 50 units five times / day (with meals and snacks)      . metolazone (ZAROXOLYN) 5 MG tablet Take 5 mg by mouth every Monday, Wednesday, and Friday.         . mycophenolate (CELLCEPT) 250 MG capsule Take 750 mg by mouth 2 (two) times daily.       Marland Kitchen omeprazole (PRILOSEC) 20 MG capsule Take 20 mg by mouth daily.      . potassium chloride (MICRO-K) 10 MEQ CR capsule Take 20 mEq by mouth 2 (two) times daily.       . predniSONE (DELTASONE) 5 MG tablet Take 5 mg by mouth daily.        . rosuvastatin (CRESTOR) 20 MG tablet Take 20 mg by mouth at bedtime.       . tacrolimus (PROGRAF) 0.5 MG capsule Take 1-1.5 mg by mouth 2 (two) times daily. Takes 2 tabs in am, 3 tabs in evening      . Tamsulosin HCl (FLOMAX) 0.4 MG CAPS Take 0.4 mg by mouth daily after breakfast.       . traZODone (DESYREL) 50 MG tablet Take 1 tablet (50 mg total) by mouth at bedtime.  30 tablet  0   No facility-administered medications prior to visit.    PAST MEDICAL HISTORY: Past Medical History  Diagnosis Date  . Diabetes mellitus   . Hypertension   . Leg pain 02/05/11    with walking  . Weight gain   . Shortness of breath     when lying flat  . Diminished eyesight   . Hearing difficulty   . Chronic kidney disease     Dia;ysis MWF  . Anemia   . Nervousness(799.21)   . COPD (chronic obstructive pulmonary disease)   . CHF (congestive heart failure)   . Posttraumatic stress disorder   . Allergic rhinitis   . Dyslipidemia   . Morbid obesity   . Diabetic peripheral neuropathy   . Gait disorder   . Obstructive sleep apnea   . Bilateral carpal tunnel syndrome   . Anxiety disorder   . Gastroparesis   . Gout   . Diabetic retinopathy   . Renal transplant, status post 04/05/2014    PAST SURGICAL HISTORY: Past Surgical History  Procedure Laterality Date  . Kidney transplant    . Eye surgery    . Av fistula placement      left  . Multiple av graft placements      FAMILY HISTORY: Family History  Problem Relation Age of Onset  . Hypertension Mother   . Diabetes Father     SOCIAL HISTORY: History   Social History  . Marital Status: Legally Separated     Spouse Name: N/A    Number of Children: 0  . Years of Education: College    Occupational History  .     Social History Main Topics  . Smoking status: Never Smoker   . Smokeless tobacco: Never Used  . Alcohol Use: No  . Drug  Use: No  . Sexual Activity: Not on file   Other Topics Concern  . Not on file   Social History Narrative   Patient is separated.   Patient does not have any children.   Patient is right-handed   Patient is currently in college working on his Ph.D.            PHYSICAL EXAM  Filed Vitals:   04/05/14 1338  BP: 120/64  Pulse: 84  Resp: 16  Weight: 290 lb (131.543 kg)   Body mass index is 39.32 kg/(m^2).  Generalized: Well developed, in no acute distress   Patients mallomapti is 4 and neck circumference.is 20.75 inches . Thick neck.   Neurological examination  Mentation: Alert oriented to time, place, history taking. Follows all commands speech and language fluent Cranial nerve II-XII: Extraocular movements were full, visual field were full on confrontational test. Facial sensation was normal.  Motor: The motor testing reveals 5 over 5 strength of all 4 extremities. Good symmetric motor tone is noted throughout.  Bilateral foot drops, left greater than right. Sensory: Sensory testing is intact to pinprick, soft touch, vibration sensation in upper extremities. Decreased to pinprick and vibration in lower extremities. No evidence of extinction is noted.  Coordination: Cerebellar testing reveals good finger-nose-finger and heel-to-shin bilaterally.  Gait and station: Patient is walking with a walker, much improved over last year. Tandem gait not attempted.  Reflexes: Deep tendon reflexes are symmetric but depressed in the upper extremities. Absent in the lower extremities.   DIAGNOSTIC DATA (LABS, IMAGING, TESTING) - I reviewed patient records, labs, notes, testing and imaging myself where available.  Lab Results  Component Value Date   WBC 12.3*  01/15/2014   HGB 13.8 01/15/2014   HCT 43.5 01/15/2014   MCV 86.0 01/15/2014   PLT 247 01/15/2014      Component Value Date/Time   NA 145 01/15/2014 1704   K 3.4* 01/15/2014 1704   CL 99 01/15/2014 1704   CO2 28 01/15/2014 1704   GLUCOSE 104* 01/15/2014 1704   BUN 65* 01/15/2014 1704   CREATININE 2.66* 01/15/2014 1704   CALCIUM 8.6 01/15/2014 1704   PROT 6.0 12/31/2009 0341   ALBUMIN 3.2* 01/01/2010 0355   AST 25 12/31/2009 0341   ALT 23 12/31/2009 0341   ALKPHOS 77 12/31/2009 0341   BILITOT 0.7 12/31/2009 0341   GFRNONAA 26* 01/15/2014 1704   GFRAA 31* 01/15/2014 1704    ASSESSMENT AND PLAN 48 y.o. year old male  has a past medical history of Diabetes mellitus; Hypertension; Leg pain (02/05/11); Weight gain; Shortness of breath; Diminished eyesight; Hearing difficulty; Chronic kidney disease; Anemia; Nervousness(799.21); COPD (chronic obstructive pulmonary disease); CHF (congestive heart failure); Posttraumatic stress disorder; Allergic rhinitis; Dyslipidemia; Morbid obesity; Diabetic peripheral neuropathy; Gait disorder; Obstructive sleep apnea; Bilateral carpal tunnel syndrome; Anxiety disorder; Gastroparesis; Gout; Diabetic retinopathy; and Renal transplant, status post (04/05/2014). here with   1.  Patient with severe claustrphobia , unable to tolerate CPAP therapy. He wants to be re evaluated for apnea to get his drivers licence reinstated. I will revisit the apnea issue, but if his driving capacity may be impaired by other issues , which t are followed by Dr. Jannifer Franklin .    The patient continues to have pain in upper and lower extremities, making it difficult for him to rest at night. The patient is wheelchair-bound and uses a walker for transfers. Will increase the gabapentin to 200 mg TID. He will be seen at the wound  center tomorrow for left toe wound. I encouraged the patient to use the AFO braces if he ambulates with the walker. The patient should look at his feet daily to observe for any additional wounds.     Larey Seat , MD 04/05/2014, 2:04 PM [facility] [address], [place], [place] [address] [phone]  Note: This document was prepared with digital dictation and possible smart phrase technology. Any transcriptional errors that result from this process are unintentional. Chauntae Hults

## 2014-04-11 ENCOUNTER — Encounter: Payer: Self-pay | Admitting: Neurology

## 2014-04-18 ENCOUNTER — Encounter: Payer: Self-pay | Admitting: Neurology

## 2014-04-18 ENCOUNTER — Telehealth: Payer: Self-pay | Admitting: Neurology

## 2014-04-18 NOTE — Telephone Encounter (Signed)
I called the patient to schedule his sleep study appointment.  He mentioned that he had just spoken with Butch Penny(?) regarding a paper to send to the [facility] and why he is being charged $20.  Advised the patient that I had no knowledge of his situation and I could not find any information in his chart.  He is requesting a call back from Butch Penny to discuss further.  Will send this message to our 2 Donna's to see if call can be returned to the patient.

## 2014-04-18 NOTE — Telephone Encounter (Addendum)
I spoke to patient and relayed that he has to pay the money for the letter to the [facility].  He explained that the reason for the visit to the doctor was because he needed a letter to the [facility] and he wasn't told that there was a separate fee for the letter.  He said he already paid for the visit.  In speaking with the patient the letter that was written is incorrect, he already has his drivers license and needs the letter to say that he is scheduled for a sleep study evaluation on 05-07-14.  Letter will be done and faxed to [facility].  In looking at his appointments it says he has a bad dept balance, but when checked with billing he does not.  Not sure why this is showing up on my computer.

## 2014-04-19 ENCOUNTER — Telehealth: Payer: Self-pay | Admitting: Neurology

## 2014-04-19 NOTE — Telephone Encounter (Signed)
Left message that we needed a signed release before we can fax letter to [facility].  I will mail him one or he can come to office and sign one and then letter can be faxed.

## 2014-04-19 NOTE — Telephone Encounter (Signed)
Patient returning call to Butch Penny, please return call and advise.

## 2014-04-25 ENCOUNTER — Telehealth: Payer: Self-pay

## 2014-04-25 NOTE — Telephone Encounter (Signed)
Looks like this is Dr Camillia Herter patient.

## 2014-04-30 ENCOUNTER — Other Ambulatory Visit: Payer: Self-pay

## 2014-04-30 MED ORDER — SILDENAFIL CITRATE 100 MG PO TABS: 100.0000 mg | ORAL_TABLET | Freq: Every day | ORAL | Status: DC | PRN

## 2014-04-30 NOTE — Telephone Encounter (Signed)
We can refill. [name] [name]

## 2014-05-03 ENCOUNTER — Encounter (HOSPITAL_COMMUNITY): Payer: Self-pay | Admitting: Psychiatry

## 2014-05-03 ENCOUNTER — Telehealth (HOSPITAL_COMMUNITY): Payer: Self-pay

## 2014-05-03 ENCOUNTER — Ambulatory Visit (INDEPENDENT_AMBULATORY_CARE_PROVIDER_SITE_OTHER): Payer: Medicare Other | Admitting: Psychiatry

## 2014-05-03 VITALS — BP 132/69 | HR 88 | Ht 72.0 in | Wt 288.0 lb

## 2014-05-03 DIAGNOSIS — F3289 Other specified depressive episodes: Secondary | ICD-10-CM | POA: Diagnosis not present

## 2014-05-03 DIAGNOSIS — F4310 Post-traumatic stress disorder, unspecified: Secondary | ICD-10-CM

## 2014-05-03 DIAGNOSIS — F329 Major depressive disorder, single episode, unspecified: Secondary | ICD-10-CM

## 2014-05-03 DIAGNOSIS — G4700 Insomnia, unspecified: Secondary | ICD-10-CM | POA: Diagnosis not present

## 2014-05-03 MED ORDER — MIRTAZAPINE 15 MG PO TABS: 15.0000 mg | ORAL_TABLET | Freq: Every day | ORAL | Status: DC

## 2014-05-03 NOTE — Telephone Encounter (Signed)
05/03/14 Patient requested his patient notes (12/28/13)  from last visit with his therapist - I [name] [name]) s/w the therapist Anderson Malta and the last notes were given to patient before leaving the office.Marland KitchenMariana Kaufman

## 2014-05-03 NOTE — Progress Notes (Signed)
   [facility] Follow-up Outpatient Visit  [name] [name] Jul 08, 1966  Date:  05/03/14 Subjective:  Pt is here for follow up sleep disturbance  Pt is here with motorized wheelchair. Noted to be in better spirits, than last visit. He is well groomed. Sleeping is still poor, only getting 3-4 hours. No nightmares, or flash backs. Appetite is poor. Mood is irritable, dysphoric, anxious. Pt has multiple medical issues. Trazodone didn't work; he wants a different medication. Pt has hx of Ptsd, so will trial mirtazapine for insomnia and mood. Pt has h/o kidney transplant. Pt is on gabapentin and cymbalta from other provider. Rtc in 4 weeks.  There were no vitals filed for this visit.  Mental Status Examination  Appearance: casual Alert: Yes Attention: fair  Cooperative: Yes Eye Contact: Fair Speech: wdl  Psychomotor Activity: Psychomotor Retardation Memory/Concentration: fair  Oriented: time/date and day of week Mood: Anxious and Dysphoric Affect: Constricted Thought Processes and Associations: Goal Directed Fund of Knowledge: Fair Thought Content: preoccupations Insight: Fair Judgement: Fair  Diagnosis:  PTSD MDD, recurrent, severe Insomnia Treatment Plan:  Rtc in 4 weeks Mirtazapine 15 mg hs for sleep   Madison Hickman, NP

## 2014-05-07 DIAGNOSIS — E1165 Type 2 diabetes mellitus with hyperglycemia: Secondary | ICD-10-CM | POA: Diagnosis not present

## 2014-05-07 DIAGNOSIS — E7800 Pure hypercholesterolemia, unspecified: Secondary | ICD-10-CM | POA: Diagnosis not present

## 2014-05-07 DIAGNOSIS — E1129 Type 2 diabetes mellitus with other diabetic kidney complication: Secondary | ICD-10-CM | POA: Diagnosis not present

## 2014-05-07 DIAGNOSIS — N183 Chronic kidney disease, stage 3 unspecified: Secondary | ICD-10-CM | POA: Diagnosis not present

## 2014-05-13 ENCOUNTER — Ambulatory Visit (INDEPENDENT_AMBULATORY_CARE_PROVIDER_SITE_OTHER): Payer: Medicare Other | Admitting: Neurology

## 2014-05-13 ENCOUNTER — Encounter: Payer: Self-pay | Admitting: Neurology

## 2014-05-13 DIAGNOSIS — G4733 Obstructive sleep apnea (adult) (pediatric): Secondary | ICD-10-CM

## 2014-05-13 DIAGNOSIS — Z940 Kidney transplant status: Secondary | ICD-10-CM

## 2014-05-13 DIAGNOSIS — R0902 Hypoxemia: Secondary | ICD-10-CM

## 2014-05-19 ENCOUNTER — Telehealth: Payer: Self-pay | Admitting: Neurology

## 2014-05-19 NOTE — Telephone Encounter (Signed)
Called the patient to discuss findings from his sleep study report.  I explained to [name] that his sleep study revealed severe obstructive sleep apnea and severe hypoxemia.   Treatment for his osa was again refused.  He understands that because he has refused treatment on more than one occasion, he may no longer be followed by [facility] or Dr. Brett Fairy.  He will also not receive a medical recommendation to approve him for driving.  Pt acknowledged understanding.  He will receive his sleep study report via mail.  Note to Dr. Floyde Parkins:  A follow up appointment shall not be scheduled in the sleep clinic at [facility].  This patient shall no longer be seen or treated here for his sleep disorder.  The recommendation from Dr. Brett Fairy will be for you to refer the patient to psychiatry for anxiety/claustrophobia, an ENT, or a pulmonologist.

## 2014-05-22 DIAGNOSIS — Z794 Long term (current) use of insulin: Secondary | ICD-10-CM | POA: Diagnosis not present

## 2014-05-22 DIAGNOSIS — E11359 Type 2 diabetes mellitus with proliferative diabetic retinopathy without macular edema: Secondary | ICD-10-CM | POA: Diagnosis not present

## 2014-05-22 DIAGNOSIS — E1129 Type 2 diabetes mellitus with other diabetic kidney complication: Secondary | ICD-10-CM | POA: Diagnosis not present

## 2014-05-22 DIAGNOSIS — R7309 Other abnormal glucose: Secondary | ICD-10-CM | POA: Diagnosis not present

## 2014-05-22 DIAGNOSIS — N183 Chronic kidney disease, stage 3 unspecified: Secondary | ICD-10-CM | POA: Diagnosis not present

## 2014-05-22 DIAGNOSIS — K3184 Gastroparesis: Secondary | ICD-10-CM | POA: Diagnosis not present

## 2014-05-22 DIAGNOSIS — E1165 Type 2 diabetes mellitus with hyperglycemia: Secondary | ICD-10-CM | POA: Diagnosis not present

## 2014-05-22 DIAGNOSIS — E1149 Type 2 diabetes mellitus with other diabetic neurological complication: Secondary | ICD-10-CM | POA: Diagnosis not present

## 2014-05-22 DIAGNOSIS — E1139 Type 2 diabetes mellitus with other diabetic ophthalmic complication: Secondary | ICD-10-CM | POA: Diagnosis not present

## 2014-05-24 ENCOUNTER — Encounter: Payer: Self-pay | Admitting: Podiatry

## 2014-05-24 ENCOUNTER — Ambulatory Visit (INDEPENDENT_AMBULATORY_CARE_PROVIDER_SITE_OTHER): Payer: Medicare Other

## 2014-05-24 ENCOUNTER — Ambulatory Visit (INDEPENDENT_AMBULATORY_CARE_PROVIDER_SITE_OTHER): Payer: Medicare Other | Admitting: Podiatry

## 2014-05-24 VITALS — BP 145/62 | HR 96 | Resp 12

## 2014-05-24 DIAGNOSIS — T07XXXA Unspecified multiple injuries, initial encounter: Secondary | ICD-10-CM

## 2014-05-24 DIAGNOSIS — L97509 Non-pressure chronic ulcer of other part of unspecified foot with unspecified severity: Secondary | ICD-10-CM | POA: Diagnosis not present

## 2014-05-24 DIAGNOSIS — IMO0001 Reserved for inherently not codable concepts without codable children: Secondary | ICD-10-CM

## 2014-05-24 DIAGNOSIS — T814XXA Infection following a procedure, initial encounter: Principal | ICD-10-CM

## 2014-05-24 DIAGNOSIS — L97513 Non-pressure chronic ulcer of other part of right foot with necrosis of muscle: Secondary | ICD-10-CM

## 2014-05-24 MED ORDER — COLLAGENASE 250 UNIT/GM EX OINT: 1.0000 "application " | TOPICAL_OINTMENT | Freq: Every day | CUTANEOUS | Status: DC

## 2014-05-24 NOTE — Progress Notes (Signed)
   Subjective:    Patient ID: [name] [name], male    DOB: Dec 06, 1965, 48 y.o.   MRN: [id number]  HPI   48 year old male presents the office today with complaints of right foot wound on the bottom of his first digit and at the end of the second digit. States the wound is in present for proximally 6 months and has been progressing. If he previously went to the wound care center. He has been applying any ointment and a dressing over the wounds daily however he is unsure what it is. Denies any systemic complaints such as fevers, chills, nausea, vomiting. Presents today wearing an old sneaker and a scooter. States that he does walk for short periods of time.  states that his sugar is under control and runs in the 90s to 100's range. No other complaints.     Review of Systems  Constitutional: Positive for activity change, appetite change and unexpected weight change.  Endocrine: Positive for heat intolerance.  Musculoskeletal: Positive for gait problem.  Allergic/Immunologic: Positive for environmental allergies and food allergies.  Neurological: Positive for weakness.  All other systems reviewed and are negative.      Objective:   Physical Exam  Nursing note and vitals reviewed. Constitutional: He is oriented to person, place, and time. He appears well-developed and well-nourished.  Musculoskeletal: Normal range of motion. He exhibits no edema and no tenderness.  Neurological: He is alert and oriented to person, place, and time.  Decreased vibratory and protective sensation.  Skin:  Wound on the plantar aspect of the right hallux near the level of the IPJ measures 1.5 x 1.5 x 1 cm. Wound base is fibro-granular. Wound currently does not probe to bone however it is very close. Very well and is hyperkeratotic. No surrounding erythema, drainage, ascending cellulitis. Within the distal aspect of the right second digit measuring approximately 1 x 0.5 cm with overlying hyperkeratotic tissue. Wound  does not probe and there is no surrounding erythema, drainage, purulence. No other wounds noted. No clinical signs of infection.   DP palpable, PT decreased. CRT < 3 sec.       Assessment & Plan:   48 year old male with chronic ulceration to the right hallux and second digit -X-rays were obtained which revealed a soft tissue emphysema. There is a small area of cortical irregularity on the oblique view however there is not significant change compared to prior and there is currently no clinical signs of infection.  -Discussed x-ray findings with the patient in detail. We'll obtain serial x-rays to monitor and also discussed the possibility of performing a bone biopsy.  -Given the decreased pulse exam we'll obtain ABIs and PVRs -Dispensed a surgical shoe. He is felt padding to offload the ulcerative sites. -Prescribed seen until to be applied to the wound daily. -Signs and symptoms of infection discussed the patient in detail. Patient directed to call the office immediately or go directly to the emergency room if any are to occur.  -Wound sharply debrided without any complications. -Follow up with PCP for other concerns mentioned in ROS.  -Followup in 10 days or sooner if any problems are to arise. Call with any questions/concerns

## 2014-05-24 NOTE — Patient Instructions (Signed)
Clean right foot wound daily. Apply santyl to wound followed by dressing. Monitor for any signs/symptoms of infection. If any are to occur call the office immediately or go directly to the emergency room. Call office with any questions/concerns.   Diabetes and Foot Care Diabetes may cause you to have problems because of poor blood supply (circulation) to your feet and legs. This may cause the skin on your feet to become thinner, break easier, and heal more slowly. Your skin may become dry, and the skin may peel and crack. You may also have nerve damage in your legs and feet causing decreased feeling in them. You may not notice minor injuries to your feet that could lead to infections or more serious problems. Taking care of your feet is one of the most important things you can do for yourself.  HOME CARE INSTRUCTIONS  Wear shoes at all times, even in the house. Do not go barefoot. Bare feet are easily injured.  Check your feet daily for blisters, cuts, and redness. If you cannot see the bottom of your feet, use a mirror or ask someone for help.  Wash your feet with warm water (do not use hot water) and mild soap. Then pat your feet and the areas between your toes until they are completely dry. Do not soak your feet as this can dry your skin.  Apply a moisturizing lotion or petroleum jelly (that does not contain alcohol and is unscented) to the skin on your feet and to dry, brittle toenails. Do not apply lotion between your toes.  Trim your toenails straight across. Do not dig under them or around the cuticle. File the edges of your nails with an emery board or nail file.  Do not cut corns or calluses or try to remove them with medicine.  Wear clean socks or stockings every day. Make sure they are not too tight. Do not wear knee-high stockings since they may decrease blood flow to your legs.  Wear shoes that fit properly and have enough cushioning. To break in new shoes, wear them for just a few  hours a day. This prevents you from injuring your feet. Always look in your shoes before you put them on to be sure there are no objects inside.  Do not cross your legs. This may decrease the blood flow to your feet.  If you find a minor scrape, cut, or break in the skin on your feet, keep it and the skin around it clean and dry. These areas may be cleansed with mild soap and water. Do not cleanse the area with peroxide, alcohol, or iodine.  When you remove an adhesive bandage, be sure not to damage the skin around it.  If you have a wound, look at it several times a day to make sure it is healing.  Do not use heating pads or hot water bottles. They may burn your skin. If you have lost feeling in your feet or legs, you may not know it is happening until it is too late.  Make sure your health care provider performs a complete foot exam at least annually or more often if you have foot problems. Report any cuts, sores, or bruises to your health care provider immediately. SEEK MEDICAL CARE IF:   You have an injury that is not healing.  You have cuts or breaks in the skin.  You have an ingrown nail.  You notice redness on your legs or feet.  You feel burning or  tingling in your legs or feet.  You have pain or cramps in your legs and feet.  Your legs or feet are numb.  Your feet always feel cold. SEEK IMMEDIATE MEDICAL CARE IF:   There is increasing redness, swelling, or pain in or around a wound.  There is a red line that goes up your leg.  Pus is coming from a wound.  You develop a fever or as directed by your health care provider.  You notice a bad smell coming from an ulcer or wound. Document Released: 09/25/2000 Document Revised: 05/31/2013 Document Reviewed: 03/07/2013 [facility] Patient Information 2015 [place], [place]. This information is not intended to replace advice given to you by your health care provider. Make sure you discuss any questions you have with your health  care provider.

## 2014-05-25 ENCOUNTER — Telehealth (HOSPITAL_COMMUNITY): Payer: Self-pay | Admitting: *Deleted

## 2014-05-30 ENCOUNTER — Encounter (HOSPITAL_BASED_OUTPATIENT_CLINIC_OR_DEPARTMENT_OTHER): Payer: Medicare Other | Attending: General Surgery

## 2014-05-30 ENCOUNTER — Other Ambulatory Visit (HOSPITAL_COMMUNITY): Payer: Self-pay | Admitting: Podiatry

## 2014-05-30 ENCOUNTER — Telehealth (HOSPITAL_COMMUNITY): Payer: Self-pay | Admitting: *Deleted

## 2014-05-30 DIAGNOSIS — L97509 Non-pressure chronic ulcer of other part of unspecified foot with unspecified severity: Secondary | ICD-10-CM

## 2014-05-31 ENCOUNTER — Ambulatory Visit (HOSPITAL_COMMUNITY)
Admission: RE | Admit: 2014-05-31 | Discharge: 2014-05-31 | Disposition: A | Discharge status: Home / Self Care | Payer: Medicare Other | Source: Ambulatory Visit | Attending: Podiatry | Admitting: Podiatry

## 2014-05-31 DIAGNOSIS — L97509 Non-pressure chronic ulcer of other part of unspecified foot with unspecified severity: Secondary | ICD-10-CM | POA: Diagnosis not present

## 2014-05-31 NOTE — Progress Notes (Signed)
Bilateral lower ext. Arterial Duplex Completed. Oda Cogan, BS, RDMS, RVT

## 2014-06-01 ENCOUNTER — Encounter: Payer: Self-pay | Admitting: Podiatry

## 2014-06-01 ENCOUNTER — Ambulatory Visit (INDEPENDENT_AMBULATORY_CARE_PROVIDER_SITE_OTHER): Payer: Medicare Other | Admitting: Podiatry

## 2014-06-01 VITALS — BP 124/56 | HR 94 | Resp 16

## 2014-06-01 DIAGNOSIS — L97509 Non-pressure chronic ulcer of other part of unspecified foot with unspecified severity: Secondary | ICD-10-CM

## 2014-06-01 DIAGNOSIS — L97512 Non-pressure chronic ulcer of other part of right foot with fat layer exposed: Secondary | ICD-10-CM

## 2014-06-01 NOTE — Progress Notes (Signed)
   Subjective:    Patient ID: [name] [name], male    DOB: 11-Aug-1966, 48 y.o.   MRN: [id number]  HPI Comments: "Its the same I think. Not any worse"  Follow up toe ulcer - plantar 1st toe right and tip 2nd toe right  Patient states that he never picked up the Santyl from the pharmacy. Been using the "white cream" Denies any systemic complaints such as fevers, chills, nausea, vomiting. He has been performing daily dressing changes. No new complaints/problems.      Review of Systems  All other systems reviewed and are negative.      Objective:   Physical Exam DP pulse palpable, PT decreased. CRT < 3 sec. Decreased protective and vibratory sensation Wound on the plantar aspect of the right hallux appears to be more superficial compared to prior. Currently the wound measures 1.5 x 1.4 x 0.7. Surrounding periwound is hyperkeratotic. Wound base is fibro-granular. No drainage, swelling erythema, ascending cellulitis. No probe to bone, tunneling or undermining. Wound at the distal aspect of the second digit with overlying pressure. Upon debridement there was a superficial wound measures proximally 0.5 x 0.2. This erythema or clinical signs of infection. No leg pain/edema. No calf pain.        Assessment & Plan:  48 year old male with chronic right hallux and second digit ulceration. -Conservative versus no intervention discussed the patient in detail including alternatives, risks, complications -At this appointment both with the shoulder debrided of any hyperkeratotic tissue down to healthy bleeding tissue. Hallux with integrated through skin, subcutaneous tissue. -Continue with in total and daily dressing changes.  -ABIs were reviewed. ABI on the left 1.3 on the right 1.3 TBI in the left 0.75 right 0.67 normal multiphasic flow without evidence of stenosis. -Continue to monitor for any clinical signs or symptoms of infection. Patient is to call the office immediately or go directly to the  emergency room feet are to occur -Will obtain new x-rays the next appointment. -F/U in one week or sooner if any concerns/change in symptoms.

## 2014-06-01 NOTE — Patient Instructions (Signed)
Continue with daily dressing changes. Monitor for any signs/symptoms of infection. Call the office immediately if any occur or go directly to the emergency room. Call with any questions/concerns.  

## 2014-06-08 ENCOUNTER — Ambulatory Visit (INDEPENDENT_AMBULATORY_CARE_PROVIDER_SITE_OTHER): Payer: Medicare Other | Admitting: Podiatry

## 2014-06-08 ENCOUNTER — Ambulatory Visit (INDEPENDENT_AMBULATORY_CARE_PROVIDER_SITE_OTHER): Payer: Medicare Other

## 2014-06-08 ENCOUNTER — Encounter: Payer: Self-pay | Admitting: Podiatry

## 2014-06-08 VITALS — BP 108/55 | HR 95 | Temp 98.2°F | Resp 16

## 2014-06-08 DIAGNOSIS — L97511 Non-pressure chronic ulcer of other part of right foot limited to breakdown of skin: Secondary | ICD-10-CM

## 2014-06-08 DIAGNOSIS — L97509 Non-pressure chronic ulcer of other part of unspecified foot with unspecified severity: Secondary | ICD-10-CM

## 2014-06-08 DIAGNOSIS — L97512 Non-pressure chronic ulcer of other part of right foot with fat layer exposed: Secondary | ICD-10-CM

## 2014-06-08 MED ORDER — COLLAGENASE 250 UNIT/GM EX OINT: 1.0000 "application " | TOPICAL_OINTMENT | Freq: Every day | CUTANEOUS | Status: DC

## 2014-06-08 NOTE — Patient Instructions (Signed)
Continue with daily dressing changes. Monitor for any signs/symptoms of infection. Call the office immediately if any occur or go directly to the emergency room. Call with any questions/concerns.  

## 2014-06-10 ENCOUNTER — Encounter: Payer: Self-pay | Admitting: Podiatry

## 2014-06-10 NOTE — Progress Notes (Signed)
Patient ID: [name] [name], male   DOB: [birth year], 48 y.o.   MRN: [id number]  Patient presents the office today for followup evaluation of plantar right hallux ulceration and distal second digit ulceration. Patient states that he has been continuing with the Santyl and daily dressing changes. Denies any systemic complaints such as fevers, chills, nausea, vomiting. No other complaints at this time.  Review of systems: Other than the right foot wound systems negative  Objective:  AAO x3, NAD DP pulse palpable, PT slightly decreased. CRT < 3 sec Decreased protective/vibratory sensation. Plantar right hallux ulcer measuring approximately 1.4 x 1.1 x 0.5. Periwound is hyperkeratotic. Wound base is fibro-granular. No surrounding erythema, drainage, fluctuance, crepitus. No probe to bone, and time when, undermining. Distal right second digit superficial ulceration, approximately 0.3 x 0.2. No erythema or other clinical signs of infection. No leg pain, edema, warmth.  Assessment: 48 year old male with right sub-hallux ulceration, 2nd digit superfical ulcer; healing  Plan: -Conservative versus surgical intervention was discussed with the patient including all alternatives, risks, complications. -X-rays were obtained. No acute changes. -Wound sharply debrided without complications. Sub-hallux ulcer debrided to healthy, granular tissue. -Continue with daily dressing changes with santyl.  -Onto for any signs or symptoms of infection. Directed to call the office immediately or go directly to the emergency room if any are to occur. -Followup in 1 week or sooner if any problems are to arise or any changes symptoms.

## 2014-06-12 DIAGNOSIS — Z940 Kidney transplant status: Secondary | ICD-10-CM | POA: Diagnosis not present

## 2014-06-12 DIAGNOSIS — N2581 Secondary hyperparathyroidism of renal origin: Secondary | ICD-10-CM | POA: Diagnosis not present

## 2014-06-12 DIAGNOSIS — E785 Hyperlipidemia, unspecified: Secondary | ICD-10-CM | POA: Diagnosis not present

## 2014-06-12 DIAGNOSIS — Z79899 Other long term (current) drug therapy: Secondary | ICD-10-CM | POA: Diagnosis not present

## 2014-06-12 DIAGNOSIS — M109 Gout, unspecified: Secondary | ICD-10-CM | POA: Diagnosis not present

## 2014-06-15 ENCOUNTER — Ambulatory Visit: Payer: Medicare Other

## 2014-06-15 ENCOUNTER — Ambulatory Visit: Payer: Medicare Other | Admitting: Podiatry

## 2014-06-15 ENCOUNTER — Ambulatory Visit (INDEPENDENT_AMBULATORY_CARE_PROVIDER_SITE_OTHER): Payer: Medicare Other | Admitting: Podiatry

## 2014-06-15 ENCOUNTER — Encounter: Payer: Self-pay | Admitting: Podiatry

## 2014-06-15 VITALS — BP 100/44 | HR 89 | Resp 18

## 2014-06-15 DIAGNOSIS — L97511 Non-pressure chronic ulcer of other part of right foot limited to breakdown of skin: Secondary | ICD-10-CM

## 2014-06-15 DIAGNOSIS — L97509 Non-pressure chronic ulcer of other part of unspecified foot with unspecified severity: Secondary | ICD-10-CM

## 2014-06-15 DIAGNOSIS — R52 Pain, unspecified: Secondary | ICD-10-CM

## 2014-06-15 NOTE — Progress Notes (Signed)
   Subjective:    Patient ID: [name] [name], male    DOB: 06-Jul-1966, 48 y.o.   MRN: [id number]  HPI " I AM DOING SO MUCH BETTER ON MY RIGHT BIG TOE AND THERE IS NO DRAINING OR SMELL"  48 year old male returns the office today for followup evaluation of plantar right hallux ulceration distal second digit ulceration. He continues using the santyl daily. Denies any fevers, chills, nausea, vomiting. No other complaints at this time.     Review of Systems  Skin: Positive for wound.  All other systems reviewed and are negative.      Objective:   Physical Exam AAO x3, NAD DP pulses palpable, PT slightly decreased. CRT immediate to all digits.  Plantar right hallux ulceration measuring 1.4 x 1 x 0.5. Periwound is hyperkeratotic. Wound base is granular. No surrounding erythema, drainage, malordor, crepitus, fluctuance. No probing bone, tunneling, undermining.  Right second digit distal toe hyperkeratotic lesion. Upon debridement no ulceration this time. No surrounding erythema or drainage. Left 4rd digit with some evidence of dried blood from around the nail. Nail appears to be firmly adhered. No erythema, drainage, or subungual hematoma. No open ulceration. No clinical signs of infection.       Assessment & Plan:  48 year old male followup violation right plantar hallux ulceration, distal second digit ulceration with new left third digit likely contusion. -Patient states that he did not notice any changes to his left foot. At this time there is no open lesions no edema or discoloration will continue to monitor. -Right second digit and hallux sharply debrided without complications. Right hallux wound sharply debrided to healthy granular tissue. -Right hallux appears to be healing with the wound base granular. Continue with daily Santyl dressing change. If the wound does not appear to be filling in next appointment will consider changing her treatment. -Wound appears to be healed at the distal  second toe. Dispensed to silicone toe pad to help offload the area. -Followup in 1 week or sooner if any problems are to arise or any change in symptoms. Call with any questions or concerns. Monitor for signs and symptoms of infection and directed to call the office immediately or go directly to the emergency room if any are to occur

## 2014-06-15 NOTE — Patient Instructions (Signed)
Continue with daily dressing changes. Monitor for any signs/symptoms of infection. Call the office immediately if any occur or go directly to the emergency room. Call with any questions/concerns.  

## 2014-06-16 ENCOUNTER — Encounter: Payer: Self-pay | Admitting: Podiatry

## 2014-06-22 ENCOUNTER — Encounter: Payer: Self-pay | Admitting: Podiatry

## 2014-06-22 ENCOUNTER — Ambulatory Visit (INDEPENDENT_AMBULATORY_CARE_PROVIDER_SITE_OTHER): Payer: Medicare Other | Admitting: Podiatry

## 2014-06-22 VITALS — BP 106/58 | HR 94 | Resp 18

## 2014-06-22 DIAGNOSIS — L97509 Non-pressure chronic ulcer of other part of unspecified foot with unspecified severity: Secondary | ICD-10-CM

## 2014-06-22 DIAGNOSIS — L97511 Non-pressure chronic ulcer of other part of right foot limited to breakdown of skin: Secondary | ICD-10-CM

## 2014-06-22 NOTE — Patient Instructions (Signed)
Continue with daily dressing changes. You can wash with antibacterial soap daily followed by santyl and dressing. Monitor for any signs/symptoms of infection. Call the office immediately if any occur or go directly to the emergency room. Call with any questions/concerns.    ANTIBACTERIAL SOAP INSTRUCTIONS  THE DAY AFTER PROCEDURE  Please follow the instructions your doctor has marked.   Shower as usual. Before getting out, place a drop of antibacterial liquid soap (Dial) on a wet, clean washcloth.  Gently wipe washcloth over affected area.  Afterward, rinse the area with warm water.  Blot the area dry with a soft cloth and cover with antibiotic ointment (neosporin, polysporin, bacitracin) and band aid or gauze and tape  Place 3-4 drops of antibacterial liquid soap in a quart of warm tap water.  Submerge foot into water for 20 minutes.  If bandage was applied after your procedure, leave on to allow for easy lift off, then remove and continue with soak for the remaining time.  Next, blot area dry with a soft cloth and cover with a bandage.  Apply other medications as directed by your doctor, such as cortisporin otic solution (eardrops) or neosporin antibiotic ointment

## 2014-06-22 NOTE — Progress Notes (Signed)
   Subjective:    Patient ID: [name] [name], male    DOB: October 20, 1965, 48 y.o.   MRN: [id number]  HPI" i am doing so much better on my right big toe and there is no smell and there is some draining"  [name] [name] returns the office today for 1 week followup appointment for right plantar hallux wound. States he's been continuing with the Santyl daily dressing changes. Denies any drainage from the area. Denies any systemic complaints as fevers, chills, nausea, vomiting. No other complaints at this time.    Review of Systems  Skin: Positive for wound.       Objective:   Physical Exam AAO x3, NAD DP pulses 2/4, PT decreased Decreased protective sensation. Plantar hallux ulceration measuring approximately 1.3 x 1 x 0.4. Wound base is granular and appears to be clean. Does appear to be filling  In compared to last week. Periwound is hyperkeratotic. No surrounding erythema, drainage, fluctuance, crepitus, malodor. No probing to bone, tunneling, or undermining. Right distal second toe hyperkeratotic lesion. No underlying ulceration. Left second digit hammertoe contracture with overlying pre-ulcerative lesion at the PIPJ. There is still some evidence of dried blood around the fourth digit nail on the left and now second digit nail. There is no surrounding erythema, drainage, open ulcer. Nails are firmly adhered. No leg pain, warmth, swelling.       Assessment & Plan:  48 year old male with right plantar hallux ulceration, which appears to be healing. -Various treatment options discussed including alternatives, risks, complications -Right plantar hallux ulceration sharply debrided of all hyperkeratotic tissue. Wound bed debrided to healthy granular red tissue. -Hyperkeratotic lesion right second distal digit sharply debrided without complications. No underlying ulceration. -Continue with offloading pads to the left foot to help prevent any further breakdown. -Continue daily dressing changes on  the right foot. Discussed that he can start to wash the foot with antibacterial soap after he showers followed by drying it thoroughly and applying the dressing. -Continue to monitor for any clinical signs or symptoms of infection. Directed to call the office immediately if any are to occur or go directly to the emergency room. -Followup in 1 week for debridement. Call the questions or concerns or change in symptoms in the meantime.

## 2014-06-29 ENCOUNTER — Encounter: Payer: Self-pay | Admitting: Podiatry

## 2014-06-29 ENCOUNTER — Ambulatory Visit (INDEPENDENT_AMBULATORY_CARE_PROVIDER_SITE_OTHER): Payer: Medicare Other | Admitting: Podiatry

## 2014-06-29 DIAGNOSIS — L97509 Non-pressure chronic ulcer of other part of unspecified foot with unspecified severity: Secondary | ICD-10-CM | POA: Diagnosis not present

## 2014-06-29 DIAGNOSIS — Z940 Kidney transplant status: Secondary | ICD-10-CM | POA: Diagnosis not present

## 2014-06-29 DIAGNOSIS — L97511 Non-pressure chronic ulcer of other part of right foot limited to breakdown of skin: Secondary | ICD-10-CM

## 2014-06-29 NOTE — Progress Notes (Signed)
   Subjective:    Patient ID: [name] [name], male    DOB: 1966/08/05, 48 y.o.   MRN: [id number]  HPI I AM DOING OK ON MY RIGHT BIG TOE  [name] [name] returns the office today for followup evaluation of right plantar hallux ulceration. States that he's been continuing with material soap washes followed by [employer] application and a dressing. Denies any systemic complaints such as fevers, chills, nausea, vomiting. Denies any drainage from the wound. Side-to-side erythema. No new complaints as possible.    Review of Systems  All other systems reviewed and are negative.      Objective:   Physical Exam AAO x3, NAD DP pulses palpable b/l, PT decreased. CRT < 3sec Decreased protective sensation with [name] [name] monofilament. Left plantar hallux ulceration measuring approximately 1.3 x 0.9 x 0.4 base is granular. Periwound is hyperkeratotic although not as much as prior. No surrounding erythema, drainage, fluctuance, crepitus, a sending cellulitis. No purulence. No probing, tunneling, undermining. Second digit distal ulceration healed. Slight hyperkeratotic lesion. Upon debridement no open lesion Left foot no open lesions. Hammertoe contractures with hyperkeratotic lesion over second digit PIPJ. No leg pain, swelling, warmth.       Assessment & Plan:  47 year old male with right plantar hallux ulceration to -Conservative versus surgical treatment were discussed including alternatives, risks, complications. -Wound sharply debrided to healthy bleeding granular tissue. The hyperkeratotic tissue removed. -Follow the wound does appear to be healing, the wound is now granular and clean so we will change from Santyl dressing changes to prisma. In order for this was placed. -Continue daily dressing changes -Hyperkeratotic lesions right second distal digit, left second PIPJ dorsal or sharply debrided without complications. -Monitor for any clinical signs or symptoms of infection. Directed to call the  office immediately if any are to occur or go directly to the emergency room. -Continue wearing offloading pads to the second digits to help prevent any further skin breakdown to -Follow up in 1 week or sooner if any problems are to arise. Monitor for any changes. Directed to call the office if any are to occur.

## 2014-06-29 NOTE — Patient Instructions (Signed)
Continue washing with antibacterial soap once dry apply santyl followed by a dressing.  We will order some new wound care supplies to be sent to her house. He is bring the tear next appointment I will shave how to use it. Monitor for any signs/symptoms of infection. Call the office immediately if any occur or go directly to the emergency room. Call with any questions/concerns.

## 2014-07-02 ENCOUNTER — Telehealth: Payer: Self-pay | Admitting: *Deleted

## 2014-07-02 NOTE — Telephone Encounter (Signed)
The patient has called and requested tape.  We do not have an active order for tape.  So I'm calling to see you all can send over an order for the tape.  I'm going to go ahead and fill out an order form and send it to you all with tape added on it.  If you want to just get the doctor to sign it and send back to [place].  If you have any questions give me a call.

## 2014-07-03 NOTE — Telephone Encounter (Signed)
Fax sent back to [facility] the add on for tape on the prescription.

## 2014-07-05 ENCOUNTER — Telehealth: Payer: Self-pay | Admitting: *Deleted

## 2014-07-05 NOTE — Telephone Encounter (Signed)
We need his date of birth and his physical address.  We did try and contact the patient, left a message on his voicemail.  I'm just calling to see if you have it.  Please give me a call.  I called and left her a message that his date of birth is 1966/02/26 and his address is [place], [place] [address].  His phone number is [phone].

## 2014-07-06 ENCOUNTER — Ambulatory Visit: Payer: Medicare Other | Admitting: Podiatry

## 2014-07-09 DIAGNOSIS — N2581 Secondary hyperparathyroidism of renal origin: Secondary | ICD-10-CM | POA: Diagnosis not present

## 2014-07-09 DIAGNOSIS — E119 Type 2 diabetes mellitus without complications: Secondary | ICD-10-CM | POA: Diagnosis not present

## 2014-07-09 DIAGNOSIS — I10 Essential (primary) hypertension: Secondary | ICD-10-CM | POA: Diagnosis not present

## 2014-07-09 DIAGNOSIS — Z940 Kidney transplant status: Secondary | ICD-10-CM | POA: Diagnosis not present

## 2014-07-10 DIAGNOSIS — E11359 Type 2 diabetes mellitus with proliferative diabetic retinopathy without macular edema: Secondary | ICD-10-CM | POA: Diagnosis not present

## 2014-07-10 DIAGNOSIS — E11319 Type 2 diabetes mellitus with unspecified diabetic retinopathy without macular edema: Secondary | ICD-10-CM | POA: Diagnosis not present

## 2014-07-10 DIAGNOSIS — E11311 Type 2 diabetes mellitus with unspecified diabetic retinopathy with macular edema: Secondary | ICD-10-CM | POA: Diagnosis not present

## 2014-07-10 DIAGNOSIS — E1139 Type 2 diabetes mellitus with other diabetic ophthalmic complication: Secondary | ICD-10-CM | POA: Diagnosis not present

## 2014-07-13 ENCOUNTER — Ambulatory Visit (INDEPENDENT_AMBULATORY_CARE_PROVIDER_SITE_OTHER): Payer: Medicare Other | Admitting: Podiatry

## 2014-07-13 ENCOUNTER — Encounter: Payer: Self-pay | Admitting: Podiatry

## 2014-07-13 VITALS — BP 141/52 | HR 90 | Temp 98.4°F | Resp 16

## 2014-07-13 DIAGNOSIS — L97511 Non-pressure chronic ulcer of other part of right foot limited to breakdown of skin: Secondary | ICD-10-CM | POA: Diagnosis not present

## 2014-07-13 NOTE — Progress Notes (Signed)
Patient ID: [name] [name], male   DOB: 11/25/1965, 48 y.o.   MRN: [id number]  Subjective: [name] [name] returns the office they for followup evaluation of right plantar hallux ulceration. States that he's been continuing with antibacterial soap washes and applying Prisma followed by a dressing daily. Denies any systemic complaints as fevers, chills, nausea, vomiting. Denies any drainage. No acute changes since last appointment. Patient states that he missed last appointment he to an injury to his knees. No other complaints at this time.  Objective: AAO x3, NAD Neurovascular status unchanged. Right plantar hallux ulceration measuring approximately 1.3 x 0.9 x 0.3 and wound base is granular. Periwound is hyperkeratotic. Mild amount of serous drainage from the wound. No evidence of purulence. No surrounding erythema or any ascending cellulitis. No undermining, tunneling, probing. No fluctuance, crepitus. Right second distal digit remains healed at this time. No other open lesions identified at this time. No leg pain, swelling, warmth.  Assessment: 48 year old male with chronic right plantar hallux ulceration  Plan: -Conservative versus surgical treatment were discussed including alternatives, risks, complications. -Discussed with the patient that over the last 2 visits the wound has not progressed as much in a lot of the wound stays open by her chance of infection and limb loss. The wound does appeared to be more superficial each visit. Discussed with the wound remains open may need to be evaluated by the wound care center. States he is been there before and does not want to go back at this time. -Wound sharply debrided of all periwound hyperkeratotic tissue and the wound base is debrided to healthy bleeding granular tissue  -Continue with daily dressing changes.  -Continue weekly debridements.  -Followup in 1 week or sooner if any problems are to arise or any changes symptoms. In the meantime call any  questions, concerns. Monitor for any signs or symptoms of directed to call the office immediately if any are to occur or go directly to the emergency room.

## 2014-07-20 ENCOUNTER — Ambulatory Visit (INDEPENDENT_AMBULATORY_CARE_PROVIDER_SITE_OTHER): Payer: Medicare Other | Admitting: Podiatry

## 2014-07-20 DIAGNOSIS — L84 Corns and callosities: Secondary | ICD-10-CM

## 2014-07-20 DIAGNOSIS — L97511 Non-pressure chronic ulcer of other part of right foot limited to breakdown of skin: Secondary | ICD-10-CM | POA: Diagnosis not present

## 2014-07-22 NOTE — Progress Notes (Signed)
Patient ID: [name] [name], male   DOB: 1966/07/02, 48 y.o.   MRN: [id number]  Subjective: [name] [name] returns the office they for followup evaluation of right plantar hallux ulceration. He states he has continued antibacterial soap washes and applying Prisma followed by a dressing each day. He denies any drainage or redness from around the area although he states he could not see well. Denies any systemic complaints such as fevers, chills, nausea, vomiting. No other complaints at this time. No acute changes since last appointment.  Objective: AAO x3, NAD Neurovascular status unchanged. Right plantar hallux ulceration measuring approximately 1 x 0.7 x 0.3 with Fibro-granular wound base. Periwound is hyperkeratotic. Mild serous drainage. No purulence. No surrounding erythema or any ascending cellulitis. No fluctuance, crepitus, malodor. No undermining, tunneling, probing. Right second digit distal hyperkeratotic lesion. Upon debridement no underlying ulceration. He has new what appears to be hemorrhagic bulla forming on the left dorsal second digit. There is no surrounding erythema or drainage. No open lesions on the left foot. No calf pain, swelling, warmth.  Assessment: 48 year old male with chronic right plantar hallux ulceration with pre-ulcerative lesions to the right second distal digit, left second digit hemorrhagic bulla.  Plan: -Treatment options discussed including alternatives, risks, complications. -Right plantar hallux ulceration sharply debrided of hyperkeratotic tissue and the wound base is debrided to healthy bleeding granular tissue. Silvadene was applied. Patient directed to continue to use Prisma daily followed by a dressing. Can wash with antibacterial soap daily. -Patient states that he does not want to go back to the wound care center. -Continue to monitor for any skin breakdown on the pre-ulcerative lesions. Monitor the left foot for any further areas. Advised the patient that  if a bulla forms of the left second digit over the hemorrhagic area not to pop it. -Monitor for any clinical signs or symptoms of infection and directed to call the office immediately if any are to occur or go directly to the emergency room. -Followup in 1 week or sooner if any problems are to arise or any change in symptoms. In the meantime call the office with any questions, concerns.

## 2014-07-27 DIAGNOSIS — Z79899 Other long term (current) drug therapy: Secondary | ICD-10-CM | POA: Diagnosis not present

## 2014-07-27 DIAGNOSIS — Z940 Kidney transplant status: Secondary | ICD-10-CM | POA: Diagnosis not present

## 2014-07-27 DIAGNOSIS — N390 Urinary tract infection, site not specified: Secondary | ICD-10-CM | POA: Diagnosis not present

## 2014-08-01 ENCOUNTER — Ambulatory Visit (INDEPENDENT_AMBULATORY_CARE_PROVIDER_SITE_OTHER): Payer: Medicare Other | Admitting: Podiatry

## 2014-08-01 ENCOUNTER — Encounter: Payer: Self-pay | Admitting: Podiatry

## 2014-08-01 VITALS — BP 125/66 | HR 74 | Resp 17

## 2014-08-01 DIAGNOSIS — L97511 Non-pressure chronic ulcer of other part of right foot limited to breakdown of skin: Secondary | ICD-10-CM

## 2014-08-01 DIAGNOSIS — R809 Proteinuria, unspecified: Secondary | ICD-10-CM | POA: Diagnosis not present

## 2014-08-03 ENCOUNTER — Encounter: Payer: Self-pay | Admitting: Podiatry

## 2014-08-03 ENCOUNTER — Ambulatory Visit (HOSPITAL_COMMUNITY): Payer: Self-pay | Admitting: Psychiatry

## 2014-08-03 NOTE — Progress Notes (Signed)
Patient ID: [name] [name], male   DOB: Jan 26, 1966, 48 y.o.   MRN: [id number]  Subjective: Patient returns the office they were followed by the right plantar hallux ulceration. He has continued antibacterial soap wash his been applying prisma followed by a dressing daily. He denies any redness or drainage from around the wound. Denies any systemic complaints as fevers, chills, nausea, vomiting. No acute changes since last appointment. No other complaints at this time.  Objective: AAO x3, NAD Neurovascular status unchanged Right plantar hallux ulceration which appears to be more transverse in nature in the plantar crease of the IPJ. The wound measures approximately 1.2 x 0.4x0.2 fibro-granular wound base. Periwound is hyperkeratotic. There is mild serous drainage. No purulence identified. No surrounding erythema, ascending cellulitis, fluctuance, crepitus, malodor. No probe to bone, tunneling, undermining. The keratotic tissue the distal aspect of the right second digit. Upon debridement no underlying ulceration and skin is intact. No calf pain, swelling, warmth, erythema  Assessment: 48 year old male with chronic plantar right hallux ulceration  Plan: -Conservative versus surgical treatment discussed including alternatives, risks, complications. -Wound sharply debrided without complications to healthy, bleeding, granular tissue. Periwound debrided of all hyperkeratotic tissue. -Continue with prisma and daily dressing changes. -The wound appears to be more transverse in nature in the crease of the IPJ. Discussed the patient how to bandage his toe to help extend the IPJ to help facilitate healing. -Hyperkeratotic lesion distal aspect right second toe sharply debrided without complications. No underlying ulceration -Followup in 10 days or sooner if any problems are to arise or any change in symptoms. In the meantime call the office they questions, concerns. Continue to monitor for signs or symptoms of  infection and directed to call the office immediately if any are to occur or go directly to the emergency room.

## 2014-08-06 DIAGNOSIS — R319 Hematuria, unspecified: Secondary | ICD-10-CM | POA: Diagnosis not present

## 2014-08-06 DIAGNOSIS — R300 Dysuria: Secondary | ICD-10-CM | POA: Diagnosis not present

## 2014-08-06 DIAGNOSIS — Z9489 Other transplanted organ and tissue status: Secondary | ICD-10-CM | POA: Diagnosis not present

## 2014-08-06 DIAGNOSIS — N401 Enlarged prostate with lower urinary tract symptoms: Secondary | ICD-10-CM | POA: Diagnosis not present

## 2014-08-06 DIAGNOSIS — N183 Chronic kidney disease, stage 3 (moderate): Secondary | ICD-10-CM | POA: Diagnosis not present

## 2014-08-06 DIAGNOSIS — Z794 Long term (current) use of insulin: Secondary | ICD-10-CM | POA: Diagnosis not present

## 2014-08-09 ENCOUNTER — Ambulatory Visit: Payer: Medicare Other | Admitting: Neurology

## 2014-08-10 ENCOUNTER — Ambulatory Visit: Payer: Medicare Other | Admitting: Podiatry

## 2014-08-14 ENCOUNTER — Emergency Department (HOSPITAL_COMMUNITY)
Admission: EM | Admit: 2014-08-14 | Discharge: 2014-08-14 | Disposition: A | Discharge status: Home / Self Care | Payer: Medicare Other | Attending: Emergency Medicine | Admitting: Emergency Medicine

## 2014-08-14 ENCOUNTER — Encounter (HOSPITAL_COMMUNITY): Payer: Self-pay

## 2014-08-14 DIAGNOSIS — H9190 Unspecified hearing loss, unspecified ear: Secondary | ICD-10-CM | POA: Insufficient documentation

## 2014-08-14 DIAGNOSIS — Z8719 Personal history of other diseases of the digestive system: Secondary | ICD-10-CM | POA: Insufficient documentation

## 2014-08-14 DIAGNOSIS — Z7982 Long term (current) use of aspirin: Secondary | ICD-10-CM | POA: Diagnosis not present

## 2014-08-14 DIAGNOSIS — I129 Hypertensive chronic kidney disease with stage 1 through stage 4 chronic kidney disease, or unspecified chronic kidney disease: Secondary | ICD-10-CM | POA: Diagnosis not present

## 2014-08-14 DIAGNOSIS — Z940 Kidney transplant status: Secondary | ICD-10-CM | POA: Diagnosis not present

## 2014-08-14 DIAGNOSIS — R300 Dysuria: Secondary | ICD-10-CM | POA: Diagnosis not present

## 2014-08-14 DIAGNOSIS — Z862 Personal history of diseases of the blood and blood-forming organs and certain disorders involving the immune mechanism: Secondary | ICD-10-CM | POA: Diagnosis not present

## 2014-08-14 DIAGNOSIS — E785 Hyperlipidemia, unspecified: Secondary | ICD-10-CM | POA: Diagnosis not present

## 2014-08-14 DIAGNOSIS — N189 Chronic kidney disease, unspecified: Secondary | ICD-10-CM | POA: Diagnosis not present

## 2014-08-14 DIAGNOSIS — G4733 Obstructive sleep apnea (adult) (pediatric): Secondary | ICD-10-CM | POA: Insufficient documentation

## 2014-08-14 DIAGNOSIS — J449 Chronic obstructive pulmonary disease, unspecified: Secondary | ICD-10-CM | POA: Diagnosis not present

## 2014-08-14 DIAGNOSIS — F419 Anxiety disorder, unspecified: Secondary | ICD-10-CM | POA: Insufficient documentation

## 2014-08-14 DIAGNOSIS — I509 Heart failure, unspecified: Secondary | ICD-10-CM | POA: Insufficient documentation

## 2014-08-14 DIAGNOSIS — H547 Unspecified visual loss: Secondary | ICD-10-CM | POA: Diagnosis not present

## 2014-08-14 DIAGNOSIS — Z880 Allergy status to penicillin: Secondary | ICD-10-CM | POA: Diagnosis not present

## 2014-08-14 DIAGNOSIS — E11319 Type 2 diabetes mellitus with unspecified diabetic retinopathy without macular edema: Secondary | ICD-10-CM | POA: Diagnosis not present

## 2014-08-14 DIAGNOSIS — Z79899 Other long term (current) drug therapy: Secondary | ICD-10-CM | POA: Insufficient documentation

## 2014-08-14 DIAGNOSIS — Z794 Long term (current) use of insulin: Secondary | ICD-10-CM | POA: Diagnosis not present

## 2014-08-14 DIAGNOSIS — E6609 Other obesity due to excess calories: Secondary | ICD-10-CM | POA: Diagnosis not present

## 2014-08-14 DIAGNOSIS — I10 Essential (primary) hypertension: Secondary | ICD-10-CM | POA: Diagnosis not present

## 2014-08-14 DIAGNOSIS — M109 Gout, unspecified: Secondary | ICD-10-CM | POA: Diagnosis not present

## 2014-08-14 LAB — URINE MICROSCOPIC-ADD ON

## 2014-08-14 LAB — URINALYSIS, ROUTINE W REFLEX MICROSCOPIC
Bilirubin Urine: NEGATIVE
Glucose, UA: NEGATIVE mg/dL
Ketones, ur: NEGATIVE mg/dL
Leukocytes, UA: NEGATIVE
Nitrite: NEGATIVE
Protein, ur: >300 mg/dL — AB
Specific Gravity, Urine: 1.015 (ref 1.005–1.030)
Urobilinogen, UA: 0.2 mg/dL (ref 0.0–1.0)
pH: 6 (ref 5.0–8.0)

## 2014-08-14 MED ORDER — PHENAZOPYRIDINE HCL 200 MG PO TABS: 200.0000 mg | ORAL_TABLET | Freq: Three times a day (TID) | ORAL | Status: DC

## 2014-08-14 MED ORDER — PHENAZOPYRIDINE HCL 100 MG PO TABS
95.0000 mg | ORAL_TABLET | Freq: Once | ORAL | Status: AC
  Administered 2014-08-14: 100 mg via ORAL
  Filled 2014-08-14: qty 1

## 2014-08-14 NOTE — ED Provider Notes (Signed)
CSN: [id number]     Arrival date & time 08/14/14  1218 History   First MD Initiated Contact with Patient 08/14/14 1527     Chief Complaint  Patient presents with  . urinary burning      (Consider location/radiation/quality/duration/timing/severity/associated sxs/prior Treatment) HPI  The patient reports he's had burning with urination for about 9 days. He has no associated symptoms. No fever no flank pain. He denies sexual activity or the possibility of sexually transmitted disease. He denies penile discharge. He denies he's having to strain at urination. The patient is post kidney transplant. He saw his nephrologist at the onset of symptoms. He reports at that time he was told he did not have a urinary tract infection and did not need antibiotics. He reports blood chemistries were done at that point time and did not show any significant abnormality. He reports subsequent to that he did go to see the urologist. No specific treatment was initiated is no specific findings were identified. He was advised that he would be scheduled to get cystoscopy but that is not for about another week. The patient reports the symptoms persist and he is here for further evaluation.the patient reports that he stopped taking his Lasix and that decreased the pain from about a 10 out of 10 to a 4 out of 10. But the symptoms do persist. Now he reports his legs are sinus well little bit because he has discontinued his Lasix.  Past Medical History  Diagnosis Date  . Diabetes mellitus   . Hypertension   . Leg pain 02/05/11    with walking  . Weight gain   . Shortness of breath     when lying flat  . Diminished eyesight   . Hearing difficulty   . Chronic kidney disease     Dia;ysis MWF  . Anemia   . Nervousness(799.21)   . COPD (chronic obstructive pulmonary disease)   . CHF (congestive heart failure)   . Posttraumatic stress disorder   . Allergic rhinitis   . Dyslipidemia   . Morbid obesity   . Diabetic  peripheral neuropathy   . Gait disorder   . Obstructive sleep apnea   . Bilateral carpal tunnel syndrome   . Anxiety disorder   . Gastroparesis   . Gout   . Diabetic retinopathy   . Renal transplant, status post 04/05/2014   Past Surgical History  Procedure Laterality Date  . Kidney transplant    . Eye surgery    . Av fistula placement      left  . Multiple av graft placements     Family History  Problem Relation Age of Onset  . Hypertension Mother   . Diabetes Father    History  Substance Use Topics  . Smoking status: Never Smoker   . Smokeless tobacco: Never Used  . Alcohol Use: No    Review of Systems 10 Systems reviewed and are negative for acute change except as noted in the HPI.    Allergies  Penicillins; Ace inhibitors; Other; and Penicillin g  Home Medications   Prior to Admission medications   Medication Sig Start Date End Date Taking? Authorizing Provider  allopurinol (ZYLOPRIM) 100 MG tablet Take 100 mg by mouth daily. Take with the 300mg  tablet to make a total of 400mg  daily per patient   Yes Historical Provider, MD  allopurinol (ZYLOPRIM) 300 MG tablet Take 300 mg by mouth daily. Take with the 100mg  tablet to make a total dose of 400mg   a day per patient   Yes Historical Provider, MD  aspirin 325 MG tablet Take 325 mg by mouth daily.    Yes Historical Provider, MD  calcitRIOL (ROCALTROL) 0.5 MCG capsule Take 0.5 mcg by mouth at bedtime.    Yes Historical Provider, MD  clonazePAM (KLONOPIN) 0.5 MG tablet Take 0.5 mg by mouth 3 (three) times daily as needed for anxiety.    Yes Historical Provider, MD  cloNIDine (CATAPRES) 0.1 MG tablet Take 0.1 mg by mouth daily as needed. Take only if blood pressure is high   Yes Historical Provider, MD  collagenase (SANTYL) ointment Apply 1 application topically daily. Apply to right foot wound daily. 05/24/14  Yes Celesta Gentile, DPM  collagenase (SANTYL) ointment Apply 1 application topically daily. 06/08/14  Yes Celesta Gentile, DPM  DULoxetine (CYMBALTA) 30 MG capsule Take 1 capsule (30 mg total) by mouth daily. 02/07/14  Yes Kathrynn Ducking, MD  famciclovir (FAMVIR) 500 MG tablet Take 1 tablet (500 mg total) by mouth daily. 01/15/14  Yes Fredia Sorrow, MD  furosemide (LASIX) 40 MG tablet Take 80 mg by mouth 2 (two) times daily.    Yes Historical Provider, MD  gabapentin (NEURONTIN) 300 MG capsule Take 1 capsule (300 mg total) by mouth 3 (three) times daily. 02/07/14  Yes Kathrynn Ducking, MD  HYDROcodone-acetaminophen (NORCO/VICODIN) 5-325 MG per tablet Take 1-2 tablets by mouth 2 (two) times daily. 02/07/14  Yes Kathrynn Ducking, MD  insulin glargine (LANTUS) 100 UNIT/ML injection Inject 90 Units into the skin 3 (three) times daily.     Yes Historical Provider, MD  insulin lispro (HUMALOG) 100 UNIT/ML injection Inject 50 Units into the skin. Take 50 units five times / day (with meals and snacks)   Yes Historical Provider, MD  metolazone (ZAROXOLYN) 5 MG tablet Take 5 mg by mouth every Monday, Wednesday, and Friday.    Yes Historical Provider, MD  mirtazapine (REMERON) 15 MG tablet Take 1 tablet (15 mg total) by mouth at bedtime. 05/03/14 05/03/15 Yes Meghan Blankmann, NP  mycophenolate (CELLCEPT) 250 MG capsule Take 750 mg by mouth 2 (two) times daily.    Yes Historical Provider, MD  omeprazole (PRILOSEC) 20 MG capsule Take 20 mg by mouth daily.   Yes Historical Provider, MD  potassium chloride (MICRO-K) 10 MEQ CR capsule Take 20 mEq by mouth 2 (two) times daily.    Yes Historical Provider, MD  predniSONE (DELTASONE) 5 MG tablet Take 5 mg by mouth daily.     Yes Historical Provider, MD  rosuvastatin (CRESTOR) 20 MG tablet Take 20 mg by mouth at bedtime.    Yes Historical Provider, MD  sildenafil (VIAGRA) 100 MG tablet Take 1 tablet (100 mg total) by mouth daily as needed for erectile dysfunction. 04/30/14  Yes Burnell Blanks, MD  tacrolimus (PROGRAF) 0.5 MG capsule Take 1-1.5 mg by mouth See admin  instructions. Takes 2 tabs in am, 3 tabs in evening   Yes Historical Provider, MD  Tamsulosin HCl (FLOMAX) 0.4 MG CAPS Take 0.4 mg by mouth daily after breakfast.    Yes Historical Provider, MD  CIALIS 5 MG tablet  06/05/14   Historical Provider, MD  NOVOLOG FLEXPEN 100 UNIT/ML FlexPen  05/12/14   Historical Provider, MD  phenazopyridine (PYRIDIUM) 200 MG tablet Take 1 tablet (200 mg total) by mouth 3 (three) times daily. 08/14/14   Charlesetta Shanks, MD  potassium chloride SA (K-DUR,KLOR-CON) 20 MEQ tablet  04/24/14   Historical Provider, MD  sildenafil (  VIAGRA) 100 MG tablet Take 0.5 tablets (50 mg total) by mouth daily as needed for erectile dysfunction. 07/20/11 08/19/11  Burnell Blanks, MD   BP 147/78 mmHg  Pulse 80  Temp(Src) 98.6 F (37 C) (Oral)  Resp 18  Ht 6' (1.829 m)  Wt 270 lb (122.471 kg)  BMI 36.61 kg/m2  SpO2 100% Physical Exam  Constitutional: He is oriented to person, place, and time. He appears well-developed.  The patient is obese alert and nontoxic in appearance.  HENT:  Head: Normocephalic and atraumatic.  Eyes: EOM are normal.  Cardiovascular: Normal rate, regular rhythm and normal heart sounds.   Pulmonary/Chest: Effort normal and breath sounds normal. No respiratory distress. He has no wheezes. He has no rales.  Abdominal: Soft. Bowel sounds are normal. He exhibits no distension and no mass. There is no tenderness. There is no rebound and no guarding.  No CVA tenderness.  Musculoskeletal:  The patient has trace pitting edema bilateral lower extremities.  Neurological: He is alert and oriented to person, place, and time. Coordination normal.  Skin: Skin is warm and dry.  Psychiatric: He has a normal mood and affect.  genital: Normal visual inspection of penis. No lesions no discharge no swelling. Bilateral testes are smooth without nodules masses or inflammation.  ED Course  Procedures (including critical care time) Labs Review Labs Reviewed  URINALYSIS,  ROUTINE W REFLEX MICROSCOPIC - Abnormal; Notable for the following:    APPearance HAZY (*)    Hgb urine dipstick SMALL (*)    Protein, ur >300 (*)    All other components within normal limits  URINE MICROSCOPIC-ADD ON - Abnormal; Notable for the following:    Bacteria, UA FEW (*)    All other components within normal limits  URINE CULTURE  GC/CHLAMYDIA PROBE AMP    Imaging Review No results found.   EKG Interpretation None     Consult: Dr. Florene Glen of [facility]. Okay to give patient several days of Pyridium and advised him to follow-up with the group. MDM   Final diagnoses:  Dysuria  Renal transplant recipient   At this point the patient has dysuria without any other symptoms. He has had consultation both with his urologist and nephrologist within the last week for the same symptoms. At this time UA does not show any evidence of advancing urinary tract infection and he has a negative culture from October 16. I will re-culture the urine. Patient denies risk for STD. A culture however has been obtained for testing. At this time based on consultation with Dr. Florene Glen the patient will be given symptomatic treatment with Pyridium.    Charlesetta Shanks, MD 08/14/14 [phone]

## 2014-08-14 NOTE — Discharge Instructions (Signed)
Dysuria Dysuria is the medical term for pain with urination. There are many causes for dysuria, but urinary tract infection is the most common. If a urinalysis was performed it can show that there is a urinary tract infection. A urine culture confirms that you or your child is sick. You will need to follow up with a healthcare provider because:  If a urine culture was done you will need to know the culture results and treatment recommendations.  If the urine culture was positive, you or your child will need to be put on antibiotics or know if the antibiotics prescribed are the right antibiotics for your urinary tract infection.  If the urine culture is negative (no urinary tract infection), then other causes may need to be explored or antibiotics need to be stopped. Today laboratory work may have been done and there does not seem to be an infection. If cultures were done they will take at least 24 to 48 hours to be completed. Today x-rays may have been taken and they read as normal. No cause can be found for the problems. The x-rays may be re-read by a radiologist and you will be contacted if additional findings are made. You or your child may have been put on medications to help with this problem until you can see your primary caregiver. If the problems get better, see your primary caregiver if the problems return. If you were given antibiotics (medications which kill germs), take all of the mediations as directed for the full course of treatment.  If laboratory work was done, you need to find the results. Leave a telephone number where you can be reached. If this is not possible, make sure you find out how you are to get test results. HOME CARE INSTRUCTIONS   Empty the bladder often. Avoid holding urine for long periods of time.  After a bowel movement, women should cleanse front to back, using each tissue only once.  Empty your bladder before and after sexual intercourse.  Take all the  medicine given to you until it is gone. You may feel better in a few days, but TAKE ALL MEDICINE.  Avoid caffeine, tea, alcohol and carbonated beverages, because they tend to irritate the bladder.  In men, alcohol may irritate the prostate.  Only take over-the-counter or prescription medicines for pain, discomfort, or fever as directed by your caregiver.  If your caregiver has given you a follow-up appointment, it is very important to keep that appointment. Not keeping the appointment could result in a chronic or permanent injury, pain, and disability. If there is any problem keeping the appointment, you must call back to this facility for assistance. SEEK IMMEDIATE MEDICAL CARE IF:   Back pain develops.  A fever develops.  There is nausea (feeling sick to your stomach) or vomiting (throwing up).  Problems are no better with medications or are getting worse. MAKE SURE YOU:   Understand these instructions.  Will watch your condition.  Will get help right away if you are not doing well or get worse. Document Released: 06/26/2004 Document Revised: 12/21/2011 Document Reviewed: 05/03/2008 [facility] Patient Information 2015 [place], [place]. This information is not intended to replace advice given to you by your health care provider. Make sure you discuss any questions you have with your health care provider.

## 2014-08-14 NOTE — ED Notes (Addendum)
Pt has been having urinary burning for about a week and has been to urologist. Has been having a fever but no fever at present. Was taking lasix and since he stopped the lasix the pain is not as bad. His pain is in his penis from urinating.

## 2014-08-15 LAB — URINE CULTURE
Colony Count: NO GROWTH
Culture: NO GROWTH

## 2014-08-15 LAB — GC/CHLAMYDIA PROBE AMP
CT Probe RNA: NEGATIVE
GC Probe RNA: NEGATIVE

## 2014-08-22 ENCOUNTER — Ambulatory Visit (INDEPENDENT_AMBULATORY_CARE_PROVIDER_SITE_OTHER): Payer: Medicare Other | Admitting: Podiatry

## 2014-08-22 ENCOUNTER — Encounter: Payer: Self-pay | Admitting: Podiatry

## 2014-08-22 VITALS — BP 157/77 | HR 60 | Temp 97.7°F | Resp 15

## 2014-08-22 DIAGNOSIS — L97511 Non-pressure chronic ulcer of other part of right foot limited to breakdown of skin: Secondary | ICD-10-CM

## 2014-08-22 DIAGNOSIS — Z940 Kidney transplant status: Secondary | ICD-10-CM | POA: Diagnosis not present

## 2014-08-22 DIAGNOSIS — Z79899 Other long term (current) drug therapy: Secondary | ICD-10-CM | POA: Diagnosis not present

## 2014-08-22 NOTE — Progress Notes (Signed)
Patient ID: [name] [name], male   DOB: [birth year], 48 y.o.   MRN: [id number]  Subjective: Patient returns the office today for follow up evaluation of right plantar hallux ulceration. He has missed last appointment as he has had other issues. He states he continues with daily dressing changes and washing the area with antibiotic soap. He denies any recent redness or drainage from around the sites. He currently denies any systemic complaints as fevers, chills, nausea, vomiting. No acute changes and pes planus. No other complaints at this time.  Objective: AAO 3, NAD Neurological status unchanged. Right plantar hallux ulceration within the plantar aspect of the IPJ measuring approximately 0.8 x 0.4 x 0.2 with fibro-granular wound base. Hyperkeratotic periwound. There is no surrounding erythema, drainage, ascending cellulitis, fluctuance, crepitus. There is no probing, undermining, tunneling. No clinical signs of infection. No other open lesions identified. Pre-ulcerative callus on the right second digit. No open lesions identified in the left side. No calf pain, swelling, warmth, erythema  Assessment: 48 year old male with chronic right hallux ulceration, healing.  Plan: -Conservative versus surgical options discussed including alternatives, risks, complications. -At today's appointment the right plantar hallux ulceration was sharply debrided of hyperkeratotic tissue and the wound was sharply debrided to healthy, bleeding, granular wound base. Nose no conical signs of infection. Continue with daily dressing changes. Continue with antibiotic soap washes daily. Monitor for any conical signs or symptoms of infection and directed to call the office immediately if any are to occur or go to the ER. -A bunion splint was dispensed the patient to slightly elevate the hallux and this was shown to him how to do this today. Hopefully this will help alleviate some of the pressure within the plantar aspect of  the IPJ decrease of the joint to help get the wounds to heal. -Continue to offload the right second digit. Monitor for skin breakdown or any clinical signs of infection. -Follow-up in 10 days. In the meantime call the office if any questions, concerns, change in symptoms.

## 2014-08-24 ENCOUNTER — Ambulatory Visit (HOSPITAL_COMMUNITY): Payer: Self-pay | Admitting: Psychiatry

## 2014-08-30 DIAGNOSIS — R300 Dysuria: Secondary | ICD-10-CM | POA: Diagnosis not present

## 2014-08-30 DIAGNOSIS — N35011 Post-traumatic bulbous urethral stricture: Secondary | ICD-10-CM | POA: Diagnosis not present

## 2014-08-30 DIAGNOSIS — R319 Hematuria, unspecified: Secondary | ICD-10-CM | POA: Diagnosis not present

## 2014-08-30 DIAGNOSIS — Z794 Long term (current) use of insulin: Secondary | ICD-10-CM | POA: Diagnosis not present

## 2014-08-30 DIAGNOSIS — Z79899 Other long term (current) drug therapy: Secondary | ICD-10-CM | POA: Diagnosis not present

## 2014-08-30 DIAGNOSIS — N1330 Unspecified hydronephrosis: Secondary | ICD-10-CM | POA: Diagnosis not present

## 2014-08-30 DIAGNOSIS — Z9489 Other transplanted organ and tissue status: Secondary | ICD-10-CM | POA: Diagnosis not present

## 2014-08-30 DIAGNOSIS — I10 Essential (primary) hypertension: Secondary | ICD-10-CM | POA: Diagnosis not present

## 2014-08-30 DIAGNOSIS — E119 Type 2 diabetes mellitus without complications: Secondary | ICD-10-CM | POA: Diagnosis not present

## 2014-08-30 DIAGNOSIS — Z940 Kidney transplant status: Secondary | ICD-10-CM | POA: Diagnosis not present

## 2014-09-04 DIAGNOSIS — R809 Proteinuria, unspecified: Secondary | ICD-10-CM | POA: Diagnosis not present

## 2014-09-11 HISTORY — PX: CYSTOSCOPY W/ INTERNAL URETHROTOMY: SUR376

## 2014-09-12 ENCOUNTER — Encounter: Payer: Self-pay | Admitting: Podiatry

## 2014-09-12 ENCOUNTER — Ambulatory Visit (INDEPENDENT_AMBULATORY_CARE_PROVIDER_SITE_OTHER): Payer: Medicare Other | Admitting: Podiatry

## 2014-09-12 VITALS — BP 126/74 | HR 83 | Resp 18

## 2014-09-12 DIAGNOSIS — L97511 Non-pressure chronic ulcer of other part of right foot limited to breakdown of skin: Secondary | ICD-10-CM

## 2014-09-12 DIAGNOSIS — B351 Tinea unguium: Secondary | ICD-10-CM

## 2014-09-12 DIAGNOSIS — M79676 Pain in unspecified toe(s): Secondary | ICD-10-CM | POA: Diagnosis not present

## 2014-09-12 NOTE — Progress Notes (Signed)
Patient ID: [name] [name], male   DOB: 1966-03-05, 48 y.o.   MRN: [id number]  Subjective: 48 year old male presents the office they follow-up evaluation of right plantar hallux ulceration. He states he's been continuing daily dressing changes as directed. He is also been wearing the splint of the foot to help keep toe dorsiflex to help with the pressure off of the wound on the bottom of the big toe. He denies any drainage or purulence from around the area he states that the area is healing well. Denies any surrounding erythema, edema or any streaking. No systemic complaints as fevers, chills, nausea, vomiting. Patient asked for nail debridement as his nails are elongated and painful particularly shoe gear. No other complaints at this time. No acute changes since last appointment.   Objective: AAO x3, NAD DP/PT pulses palpable bilaterally, CRT less than 3 seconds Protective sensation decreased with SWMF. Ulceration of plantar aspect of the right hallux at the level of the IPJ is almost healed completely this time. There is a small superficial opening with central aspect with small little granulation tissue hyperkeratotic tissue. There is no surrounding erythema, edema, increase in warmth without any ascending saline disc. No tenderness over the area. There is no probing, undermining, tunneling. Right second digit distally without any pre-ulcerative callus. No open lesion the left lower extremity. Nails hypertrophic, elongated, discolored, brittle 10. No surrounding erythema or drainage from the nail sites. No pain with calf compression, swelling, warmth, erythema.  Assessment: 48 year old male follow-up evaluation of right plantar hallux ulceration which is almost she'll at this time; symptomatic onychodystrophy.  Plan: -Treatment options were discussed including alternatives, risks, competitions. -Right plantar hallux ulceration sharply debrided without complications. Recommended continue with the  brace to help keep the toe dorsiflex to help take pressure off of the plantar IPJ joint get the wound completely closed. Continue with daily dressing changes. Discussed he can use Silvadene cream as it is very superficial and almost healed. Continue to monitor for any clinical signs or symptoms of infection and directed to call the office if any are to occur or go to the emergency room. -Nail sharply debrided 10 without competitions. -Discussed importance of daily foot inspection to monitor for any other skin breakdown/ulceration. -Follow-up in 2 weeks or sooner if any problems are to arise. In the meantime, call the office with any questions, concerns, change in symptoms.

## 2014-09-26 ENCOUNTER — Ambulatory Visit (INDEPENDENT_AMBULATORY_CARE_PROVIDER_SITE_OTHER): Payer: Medicare Other | Admitting: Podiatry

## 2014-09-26 VITALS — BP 120/60 | HR 70 | Resp 16

## 2014-09-26 DIAGNOSIS — L84 Corns and callosities: Secondary | ICD-10-CM | POA: Diagnosis not present

## 2014-09-26 DIAGNOSIS — I10 Essential (primary) hypertension: Secondary | ICD-10-CM | POA: Diagnosis not present

## 2014-09-26 DIAGNOSIS — N2581 Secondary hyperparathyroidism of renal origin: Secondary | ICD-10-CM | POA: Diagnosis not present

## 2014-09-26 DIAGNOSIS — Z940 Kidney transplant status: Secondary | ICD-10-CM | POA: Diagnosis not present

## 2014-09-26 DIAGNOSIS — E119 Type 2 diabetes mellitus without complications: Secondary | ICD-10-CM | POA: Diagnosis not present

## 2014-09-28 DIAGNOSIS — Z940 Kidney transplant status: Secondary | ICD-10-CM | POA: Diagnosis not present

## 2014-09-28 DIAGNOSIS — Z794 Long term (current) use of insulin: Secondary | ICD-10-CM | POA: Diagnosis not present

## 2014-09-28 DIAGNOSIS — N189 Chronic kidney disease, unspecified: Secondary | ICD-10-CM | POA: Diagnosis not present

## 2014-09-28 DIAGNOSIS — E785 Hyperlipidemia, unspecified: Secondary | ICD-10-CM | POA: Diagnosis not present

## 2014-09-28 DIAGNOSIS — N35011 Post-traumatic bulbous urethral stricture: Secondary | ICD-10-CM | POA: Diagnosis not present

## 2014-09-28 DIAGNOSIS — I129 Hypertensive chronic kidney disease with stage 1 through stage 4 chronic kidney disease, or unspecified chronic kidney disease: Secondary | ICD-10-CM | POA: Diagnosis not present

## 2014-09-28 DIAGNOSIS — R312 Other microscopic hematuria: Secondary | ICD-10-CM | POA: Diagnosis not present

## 2014-09-28 DIAGNOSIS — N1330 Unspecified hydronephrosis: Secondary | ICD-10-CM | POA: Diagnosis not present

## 2014-09-28 DIAGNOSIS — N359 Urethral stricture, unspecified: Secondary | ICD-10-CM | POA: Diagnosis not present

## 2014-09-28 DIAGNOSIS — E11351 Type 2 diabetes mellitus with proliferative diabetic retinopathy with macular edema: Secondary | ICD-10-CM | POA: Diagnosis not present

## 2014-09-28 DIAGNOSIS — K219 Gastro-esophageal reflux disease without esophagitis: Secondary | ICD-10-CM | POA: Diagnosis not present

## 2014-09-28 DIAGNOSIS — G4733 Obstructive sleep apnea (adult) (pediatric): Secondary | ICD-10-CM | POA: Diagnosis not present

## 2014-09-30 ENCOUNTER — Encounter: Payer: Self-pay | Admitting: Podiatry

## 2014-09-30 NOTE — Progress Notes (Signed)
Patient ID: [name] [name], male   DOB: 07/09/1966, 48 y.o.   MRN: [id number]  Subjective: 48 year old male returns the office for follow-up evaluation of right plantar hallux ulceration. He states he has been continuing daily dressing changes. Denies any recent redness or drainage from around the area. No pain associated with the wound. No acute changes since last appointment and no other complaints at this time. Denies any systemic complaints such as fevers, chills, nausea, vomiting.  Objective: AAO 3, NAD DP/PT pulses palpable bilaterally, CRT less than 3 seconds. Decreased protective sensation with Derrel Nip monofilament and vibratory sensation. Hyperkeratotic lesion right foot plantar hallux at the level of the IPJ. Upon debridement of this lesion there is no underlying open lesion at this time. There is no surrounding erythema, edema, increase in warmth. No clinical signs of infection. The ulceration appears to be healed at this time. No significant buildup of hyperkeratotic tissue buildup at the distal aspect of the right second digit. No open lesions on the left lower extremity. No areas of pinpoint bony tenderness or pain with vibratory sensation. No pain with calf compression, swelling, warmth, erythema.  Assessment: 48 year old male with healed right plantar hallux ulceration  Plan: -Treatment options were discussed with the patient including alternatives, risks, complications. -Right plantar hallux hyperkeratotic site sharply debrided without complications. Currently the wound has healed without any signs of infection. -Continue to monitor the feet daily for any changes. -Follow-up in 3 months or sooner should any problems arise or any change in symptoms. In the meantime, call the office with any questions, concerns, changes symptoms. Also call and follow up sooner if the area reopens or if there is any new open lesions.

## 2014-10-03 DIAGNOSIS — R339 Retention of urine, unspecified: Secondary | ICD-10-CM | POA: Diagnosis not present

## 2014-10-16 ENCOUNTER — Encounter: Payer: Self-pay | Admitting: Neurology

## 2014-10-16 ENCOUNTER — Ambulatory Visit (INDEPENDENT_AMBULATORY_CARE_PROVIDER_SITE_OTHER): Payer: Medicare Other | Admitting: Neurology

## 2014-10-16 VITALS — BP 114/55 | HR 82 | Ht 72.0 in | Wt 271.0 lb

## 2014-10-16 DIAGNOSIS — E1342 Other specified diabetes mellitus with diabetic polyneuropathy: Secondary | ICD-10-CM | POA: Diagnosis not present

## 2014-10-16 DIAGNOSIS — E1142 Type 2 diabetes mellitus with diabetic polyneuropathy: Secondary | ICD-10-CM

## 2014-10-16 DIAGNOSIS — G629 Polyneuropathy, unspecified: Secondary | ICD-10-CM

## 2014-10-16 MED ORDER — PREGABALIN 50 MG PO CAPS: 50.0000 mg | ORAL_CAPSULE | Freq: Every day | ORAL | Status: DC

## 2014-10-16 NOTE — Patient Instructions (Signed)
Diabetic Neuropathy Diabetic neuropathy is a nerve disease or nerve damage that is caused by diabetes mellitus. About half of all people with diabetes mellitus have some form of nerve damage. Nerve damage is more common in those who have had diabetes mellitus for many years and who generally have not had good control of their blood sugar (glucose) level. Diabetic neuropathy is a common complication of diabetes mellitus. There are three more common types of diabetic neuropathy and a fourth type that is less common and less understood:   Peripheral neuropathy--This is the most common type of diabetic neuropathy. It causes damage to the nerves of the feet and legs first and then eventually the hands and arms.The damage affects the ability to sense touch.  Autonomic neuropathy--This type causes damage to the autonomic nervous system, which controls the following functions:  Heartbeat.  Body temperature.  Blood pressure.  Urination.  Digestion.  Sweating.  Sexual function.  Focal neuropathy--Focal neuropathy can be painful and unpredictable and occurs most often in older adults with diabetes mellitus. It involves a specific nerve or one area and often comes on suddenly. It usually does not cause long-term problems.  Radiculoplexus neuropathy-- Sometimes called lumbosacral radiculoplexus neuropathy, radiculoplexus neuropathy affects the nerves of the thighs, hips, buttocks, or legs. It is more common in people with type 2 diabetes mellitus and in older men. It is characterized by debilitating pain, weakness, and atrophy, usually in the thigh muscles. CAUSES  The cause of peripheral, autonomic, and focal neuropathies is diabetes mellitus that is uncontrolled and high glucose levels. The cause of radiculoplexus neuropathy is unknown. However, it is thought to be caused by inflammation related to uncontrolled glucose levels. SIGNS AND SYMPTOMS  Peripheral Neuropathy Peripheral neuropathy develops  slowly over time. When the nerves of the feet and legs no longer work there may be:   Burning, stabbing, or aching pain in the legs or feet.  Inability to feel pressure or pain in your feet. This can lead to:  Thick calluses over pressure areas.  Pressure sores.  Ulcers.  Foot deformities.  Reduced ability to feel temperature changes.  Muscle weakness. Autonomic Neuropathy The symptoms of autonomic neuropathy vary depending on which nerves are affected. Symptoms may include:  Problems with digestion, such as:  Feeling sick to your stomach (nausea).  Vomiting.  Bloating.  Constipation.  Diarrhea.  Abdominal pain.  Difficulty with urination. This occurs if you lose your ability to sense when your bladder is full. Problems include:  Urine leakage (incontinence).  Inability to empty your bladder completely (retention).  Rapid or irregular heartbeat (palpitations).  Blood pressure drops when you stand up (orthostatic hypotension). When you stand up you may feel:  Dizzy.  Weak.  Faint.  In men, inability to attain and maintain an erection.  In women, vaginal dryness and problems with decreased sexual desire and arousal.  Problems with body temperature regulation.  Increased or decreased sweating. Focal Neuropathy  Abnormal eye movements or abnormal alignment of both eyes.  Weakness in the wrist.  Foot drop. This results in an inability to lift the foot properly and abnormal walking or foot movement.  Paralysis on one side of your face (Bell palsy).  Chest or abdominal pain. Radiculoplexus Neuropathy  Sudden, severe pain in your hip, thigh, or buttocks.  Weakness and wasting of thigh muscles.  Difficulty rising from a seated position.  Abdominal swelling.  Unexplained weight loss (usually more than 10 lb [4.5 kg]). DIAGNOSIS  Peripheral Neuropathy Your senses may   be tested. Sensory function testing can be done with:  A light touch using a  monofilament.  A vibration with tuning fork.  A sharp sensation with a pin prick. Other tests that can help diagnose neuropathy are:  Nerve conduction velocity. This test checks the transmission of an electrical current through a nerve.  Electromyography. This shows how muscles respond to electrical signals transmitted by nearby nerves.  Quantitative sensory testing. This is used to assess how your nerves respond to vibrations and changes in temperature. Autonomic Neuropathy Diagnosis is often based on reported symptoms. Tell your health care provider if you experience:   Dizziness.   Constipation.   Diarrhea.   Inappropriate urination or inability to urinate.   Inability to get or maintain an erection.  Tests that may be done include:   Electrocardiography or Holter monitor. These are tests that can help show problems with the heart rate or heart rhythm.   An X-ray exam may be done. Focal Neuropathy Diagnosis is made based on your symptoms and what your health care provider finds during your exam. Other tests may be done. They may include:  Nerve conduction velocities. This checks the transmission of electrical current through a nerve.  Electromyography. This shows how muscles respond to electrical signals transmitted by nearby nerves.  Quantitative sensory testing. This test is used to assess how your nerves respond to vibration and changes in temperature. Radiculoplexus Neuropathy  Often the first thing is to eliminate any other issue or problems that might be the cause, as there is no stick test for diagnosis.  X-ray exam of your spine and lumbar region.  Spinal tap to rule out cancer.  MRI to rule out other lesions. TREATMENT  Once nerve damage occurs, it cannot be reversed. The goal of treatment is to keep the disease or nerve damage from getting worse and affecting more nerve fibers. Controlling your blood glucose level is the key. Most people with  radiculoplexus neuropathy see at least a partial improvement over time. You will need to keep your blood glucose and HbA1c levels in the target range determined by your health care provider. Things that help control blood glucose levels include:   Blood glucose monitoring.   Meal planning.   Physical activity.   Diabetes medicine.  Over time, maintaining lower blood glucose levels helps lessen symptoms. Sometimes, prescription pain medicine is needed. HOME CARE INSTRUCTIONS:  Do not smoke.  Keep your blood glucose level in the range that you and your health care provider have determined acceptable for you.  Keep your blood pressure level in the range that you and your health care provider have determined acceptable for you.  Eat a well-balanced diet.  Be active every day.  Check your feet every day. SEEK MEDICAL CARE IF:   You have burning, stabbing, or aching pain in the legs or feet.  You are unable to feel pressure or pain in your feet.  You develop problems with digestion such as:  Nausea.  Vomiting.  Bloating.  Constipation.  Diarrhea.  Abdominal pain.  You have difficulty with urination, such as:  Incontinence.  Retention.  You have palpitations.  You develop orthostatic hypotension. When you stand up you may feel:  Dizzy.  Weak.  Faint.  You cannot attain and maintain an erection (in men).  You have vaginal dryness and problems with decreased sexual desire and arousal (in women).  You have severe pain in your thighs, legs, or buttocks.  You have unexplained weight loss.   Document Released: 12/07/2001 Document Revised: 07/19/2013 Document Reviewed: 03/09/2013 [employer] Patient Information 2015 [employer]. This information is not intended to replace advice given to you by your health care provider. Make sure you discuss any questions you have with your health care provider. 

## 2014-10-16 NOTE — Progress Notes (Signed)
Reason for visit: Peripheral neuropathy  [name] [name] is an 49 y.o. male  History of present illness:  [name] [name] is a 49 year old right-handed gentleman with a history of obesity and diabetes. The patient has a peripheral neuropathy as a complication of the diabetes. He was on gabapentin which was helpful for his peripheral neuropathy discomfort, but he stopped the medication as it was causing urinary incontinence in the evening hours. He has fallen on one occasion since last seen in September 2015. The patient generally uses a power wheelchair inside the house and a scooter outside the house. He has migratory pain that may be in the hands, arms, thighs, or feet. He has a footdrop on the left. He has been told that his arms or legs may jerk at nighttime. Currently, he is not on any medications for his peripheral neuropathy discomfort. He returns for an evaluation. He indicates that in the past he has been on Cymbalta, but it is not clear that this medication helped him.   Past Medical History  Diagnosis Date  . Diabetes mellitus   . Hypertension   . Leg pain 02/05/11    with walking  . Weight gain   . Shortness of breath     when lying flat  . Diminished eyesight   . Hearing difficulty   . Chronic kidney disease     Dia;ysis MWF  . Anemia   . Nervousness(799.21)   . COPD (chronic obstructive pulmonary disease)   . CHF (congestive heart failure)   . Posttraumatic stress disorder   . Allergic rhinitis   . Dyslipidemia   . Morbid obesity   . Diabetic peripheral neuropathy   . Gait disorder   . Obstructive sleep apnea   . Bilateral carpal tunnel syndrome   . Anxiety disorder   . Gastroparesis   . Gout   . Diabetic retinopathy   . Renal transplant, status post 04/05/2014    Past Surgical History  Procedure Laterality Date  . Kidney transplant    . Eye surgery    . Av fistula placement      left  . Multiple av graft placements      Family History  Problem Relation  Age of Onset  . Hypertension Mother   . Diabetes Father     Social history:  reports that he has never smoked. He has never used smokeless tobacco. He reports that he does not drink alcohol or use illicit drugs.    Allergies  Allergen Reactions  . Penicillins Nausea Only and Other (See Comments)    Temp changes of body  . Ace Inhibitors Other (See Comments) and Cough    Other reaction(s): Cough (ALLERGY/intolerance) REACTION: cough  . Other Rash    Other reaction(s): Rash (ALLERGY/intolerance) Other reaction(s): Rash (ALLERGY/intolerance)  . Penicillin G Rash    Other reaction(s): Rash (ALLERGY/intolerance)    Medications:  Current Outpatient Prescriptions on File Prior to Visit  Medication Sig Dispense Refill  . allopurinol (ZYLOPRIM) 100 MG tablet Take 100 mg by mouth daily. Take with the 300mg  tablet to make a total of 400mg  daily per patient    . allopurinol (ZYLOPRIM) 300 MG tablet Take 300 mg by mouth daily. Take with the 100mg  tablet to make a total dose of 400mg  a day per patient    . aspirin 325 MG tablet Take 325 mg by mouth daily.     . calcitRIOL (ROCALTROL) 0.5 MCG capsule Take 0.5 mcg by mouth at bedtime.     Marland Kitchen  CIALIS 5 MG tablet     . clonazePAM (KLONOPIN) 0.5 MG tablet Take 0.5 mg by mouth 3 (three) times daily as needed for anxiety.     . cloNIDine (CATAPRES) 0.1 MG tablet Take 0.1 mg by mouth daily as needed. Take only if blood pressure is high    . furosemide (LASIX) 40 MG tablet Take 80 mg by mouth 2 (two) times daily.     Marland Kitchen HYDROcodone-acetaminophen (NORCO/VICODIN) 5-325 MG per tablet Take 1-2 tablets by mouth 2 (two) times daily. 30 tablet 0  . insulin glargine (LANTUS) 100 UNIT/ML injection Inject 90 Units into the skin 3 (three) times daily.      . insulin lispro (HUMALOG) 100 UNIT/ML injection Inject 50 Units into the skin. Take 50 units five times / day (with meals and snacks)    . metolazone (ZAROXOLYN) 5 MG tablet Take 5 mg by mouth every Monday,  Wednesday, and Friday.     . mycophenolate (CELLCEPT) 250 MG capsule Take 750 mg by mouth 2 (two) times daily.     Marland Kitchen NOVOLOG FLEXPEN 100 UNIT/ML FlexPen     . omeprazole (PRILOSEC) 20 MG capsule Take 20 mg by mouth daily.    . potassium chloride (MICRO-K) 10 MEQ CR capsule Take 20 mEq by mouth 2 (two) times daily.     . predniSONE (DELTASONE) 5 MG tablet Take 5 mg by mouth daily.      . rosuvastatin (CRESTOR) 20 MG tablet Take 20 mg by mouth at bedtime.     . tacrolimus (PROGRAF) 0.5 MG capsule Take 1-1.5 mg by mouth See admin instructions. Takes 2 tabs in am, 3 tabs in evening    . sildenafil (VIAGRA) 100 MG tablet Take 0.5 tablets (50 mg total) by mouth daily as needed for erectile dysfunction. 5 tablet 5   No current facility-administered medications on file prior to visit.    ROS:  Out of a complete 14 system review of symptoms, the patient complains only of the following symptoms, and all other reviewed systems are negative.  Fatigue Eye itching Weakness of the extremities  Blood pressure 114/55, pulse 82, height 6' (1.829 m), weight 271 lb (122.925 kg).  Physical Exam  General: The patient is alert and cooperative at the time of the examination. The patient is markedly obese.  Skin: 2+ edema is noted below the knee on the right, 1+ on the left.   Neurologic Exam  Mental status: The patient is oriented x 3.  Cranial nerves: Facial symmetry is present. Speech is normal, no aphasia or dysarthria is noted. Extraocular movements are full. Visual fields are full.  Motor: The patient has good strength in all 4 extremities, with exception that there is some mild weakness with intrinsic muscles of the hands, and a prominent left footdrop..  Sensory examination: The patient has decreased sensation to soft touch below the knees, good symmetry with soft touch on the arms and face.  Coordination: The patient has good finger-nose-finger and heel-to-shin bilaterally.  Gait and  station: The patient has a wide-based gait, requires some assistance with walking. Tandem gait was not attempted.  Reflexes: Deep tendon reflexes are symmetric, but are depressed absent bilaterally.   Assessment/Plan:  1. Diabetes  2. Diabetic peripheral neuropathy  3. Gait disorder  4. Morbid obesity  The patient does not have any medications currently for his neuropathy pain. I will try low-dose Lyrica to see if this helps the discomfort. Carbamazepine may be used in the future if this  is not effective. He otherwise will follow-up in about 6 months.  Jill Alexanders MD 10/16/2014 6:33 PM  [facility] [address] [place], [place] [address]  Phone [phone] Fax [phone]

## 2014-10-21 ENCOUNTER — Encounter (HOSPITAL_COMMUNITY): Payer: Self-pay | Admitting: *Deleted

## 2014-10-21 ENCOUNTER — Emergency Department (HOSPITAL_COMMUNITY)
Admission: EM | Admit: 2014-10-21 | Discharge: 2014-10-21 | Disposition: A | Discharge status: Home / Self Care | Payer: Medicare Other | Source: Home / Self Care | Attending: Emergency Medicine | Admitting: Emergency Medicine

## 2014-10-21 DIAGNOSIS — M545 Low back pain: Secondary | ICD-10-CM | POA: Diagnosis not present

## 2014-10-21 DIAGNOSIS — M542 Cervicalgia: Secondary | ICD-10-CM | POA: Diagnosis not present

## 2014-10-21 DIAGNOSIS — H547 Unspecified visual loss: Secondary | ICD-10-CM

## 2014-10-21 DIAGNOSIS — S335XXA Sprain of ligaments of lumbar spine, initial encounter: Secondary | ICD-10-CM | POA: Diagnosis not present

## 2014-10-21 DIAGNOSIS — K3184 Gastroparesis: Secondary | ICD-10-CM | POA: Diagnosis not present

## 2014-10-21 DIAGNOSIS — E119 Type 2 diabetes mellitus without complications: Secondary | ICD-10-CM

## 2014-10-21 DIAGNOSIS — E1121 Type 2 diabetes mellitus with diabetic nephropathy: Secondary | ICD-10-CM | POA: Diagnosis not present

## 2014-10-21 DIAGNOSIS — M109 Gout, unspecified: Secondary | ICD-10-CM

## 2014-10-21 DIAGNOSIS — M792 Neuralgia and neuritis, unspecified: Secondary | ICD-10-CM | POA: Insufficient documentation

## 2014-10-21 DIAGNOSIS — I129 Hypertensive chronic kidney disease with stage 1 through stage 4 chronic kidney disease, or unspecified chronic kidney disease: Secondary | ICD-10-CM

## 2014-10-21 DIAGNOSIS — N189 Chronic kidney disease, unspecified: Secondary | ICD-10-CM

## 2014-10-21 DIAGNOSIS — Z7982 Long term (current) use of aspirin: Secondary | ICD-10-CM | POA: Insufficient documentation

## 2014-10-21 DIAGNOSIS — Z880 Allergy status to penicillin: Secondary | ICD-10-CM | POA: Insufficient documentation

## 2014-10-21 DIAGNOSIS — H9190 Unspecified hearing loss, unspecified ear: Secondary | ICD-10-CM | POA: Insufficient documentation

## 2014-10-21 DIAGNOSIS — M5412 Radiculopathy, cervical region: Secondary | ICD-10-CM | POA: Diagnosis not present

## 2014-10-21 DIAGNOSIS — J449 Chronic obstructive pulmonary disease, unspecified: Secondary | ICD-10-CM

## 2014-10-21 DIAGNOSIS — G4733 Obstructive sleep apnea (adult) (pediatric): Secondary | ICD-10-CM

## 2014-10-21 DIAGNOSIS — Z940 Kidney transplant status: Secondary | ICD-10-CM | POA: Insufficient documentation

## 2014-10-21 DIAGNOSIS — I509 Heart failure, unspecified: Secondary | ICD-10-CM

## 2014-10-21 DIAGNOSIS — E1142 Type 2 diabetes mellitus with diabetic polyneuropathy: Secondary | ICD-10-CM | POA: Diagnosis not present

## 2014-10-21 DIAGNOSIS — N179 Acute kidney failure, unspecified: Secondary | ICD-10-CM | POA: Diagnosis not present

## 2014-10-21 DIAGNOSIS — D631 Anemia in chronic kidney disease: Secondary | ICD-10-CM | POA: Diagnosis not present

## 2014-10-21 DIAGNOSIS — F410 Panic disorder [episodic paroxysmal anxiety] without agoraphobia: Secondary | ICD-10-CM

## 2014-10-21 DIAGNOSIS — Z79899 Other long term (current) drug therapy: Secondary | ICD-10-CM | POA: Insufficient documentation

## 2014-10-21 DIAGNOSIS — Z794 Long term (current) use of insulin: Secondary | ICD-10-CM | POA: Insufficient documentation

## 2014-10-21 DIAGNOSIS — E785 Hyperlipidemia, unspecified: Secondary | ICD-10-CM

## 2014-10-21 MED ORDER — HYDROCODONE-ACETAMINOPHEN 5-325 MG PO TABS: 1.0000 | ORAL_TABLET | Freq: Four times a day (QID) | ORAL | Status: DC | PRN

## 2014-10-21 MED ORDER — OXYCODONE-ACETAMINOPHEN 5-325 MG PO TABS
2.0000 | ORAL_TABLET | Freq: Once | ORAL | Status: AC
Start: 2014-10-21 — End: 2014-10-21
  Administered 2014-10-21: 2 via ORAL
  Filled 2014-10-21: qty 2

## 2014-10-21 MED ORDER — CYCLOBENZAPRINE HCL 10 MG PO TABS
5.0000 mg | ORAL_TABLET | Freq: Once | ORAL | Status: AC
  Administered 2014-10-21: 5 mg via ORAL
  Filled 2014-10-21: qty 1

## 2014-10-21 MED ORDER — CYCLOBENZAPRINE HCL 10 MG PO TABS: 10.0000 mg | ORAL_TABLET | Freq: Two times a day (BID) | ORAL | Status: DC | PRN

## 2014-10-21 NOTE — ED Notes (Signed)
Pt reports R sided neck pain that radiates to R shoulder. Denies known injury. No other complaints.

## 2014-10-21 NOTE — Discharge Instructions (Signed)

## 2014-10-21 NOTE — ED Provider Notes (Signed)
CSN: [id number]     Arrival date & time 10/21/14  1103 History   First MD Initiated Contact with Patient 10/21/14 1128     Chief Complaint  Patient presents with  . Neck Pain     (Consider location/radiation/quality/duration/timing/severity/associated sxs/prior Treatment) HPI The patient reports that he has developed a severe burning pain that is on the right lateral aspect of his neck. He reports that is very tender just to touch. Light touch crease an intense burning quality. He is not having any weakness or numbness going into his arm. He reports his arm is functioning normally. Sometimes the pain, down and then it we'll send shots out across the top of his shoulder in the side of his neck. He denies he has any injuries. There is no associated headache or weakness or numbness. He has had multiple other neuropathic pain related problems but he denies that he's had it in this particular spot previously. He denies chest pain or shortness of breath. He reports he tried ibuprofen and Lyrica but got no relief. Past Medical History  Diagnosis Date  . Diabetes mellitus   . Hypertension   . Leg pain 02/05/11    with walking  . Weight gain   . Shortness of breath     when lying flat  . Diminished eyesight   . Hearing difficulty   . Chronic kidney disease     Dia;ysis MWF  . Anemia   . Nervousness(799.21)   . COPD (chronic obstructive pulmonary disease)   . CHF (congestive heart failure)   . Posttraumatic stress disorder   . Allergic rhinitis   . Dyslipidemia   . Morbid obesity   . Diabetic peripheral neuropathy   . Gait disorder   . Obstructive sleep apnea   . Bilateral carpal tunnel syndrome   . Anxiety disorder   . Gastroparesis   . Gout   . Diabetic retinopathy   . Renal transplant, status post 04/05/2014   Past Surgical History  Procedure Laterality Date  . Kidney transplant    . Eye surgery    . Av fistula placement      left  . Multiple av graft placements     Family  History  Problem Relation Age of Onset  . Hypertension Mother   . Diabetes Father    History  Substance Use Topics  . Smoking status: Never Smoker   . Smokeless tobacco: Never Used  . Alcohol Use: No    Review of Systems 10 Systems reviewed and are negative for acute change except as noted in the HPI.   Allergies  Penicillins; Pork-derived products; and Ace inhibitors  Home Medications   Prior to Admission medications   Medication Sig Start Date End Date Taking? Authorizing Provider  allopurinol (ZYLOPRIM) 100 MG tablet Take 100 mg by mouth daily. Take with the 300mg  tablet to make a total of 400mg  daily per patient   Yes Historical Provider, MD  allopurinol (ZYLOPRIM) 300 MG tablet Take 300 mg by mouth daily. Take with the 100mg  tablet to make a total dose of 400mg  a day per patient   Yes Historical Provider, MD  aspirin 325 MG tablet Take 325 mg by mouth daily.    Yes Historical Provider, MD  calcitRIOL (ROCALTROL) 0.5 MCG capsule Take 0.5 mcg by mouth at bedtime.    Yes Historical Provider, MD  CIALIS 5 MG tablet Take 5 mg by mouth daily as needed for erectile dysfunction.  06/05/14  Yes Historical Provider, MD  clonazePAM (KLONOPIN) 0.5 MG tablet Take 0.5 mg by mouth 3 (three) times daily as needed for anxiety.    Yes Historical Provider, MD  cloNIDine (CATAPRES) 0.1 MG tablet Take 0.1 mg by mouth daily as needed. Take only if blood pressure is high   Yes Historical Provider, MD  furosemide (LASIX) 40 MG tablet Take 80 mg by mouth 2 (two) times daily.    Yes Historical Provider, MD  insulin glargine (LANTUS) 100 UNIT/ML injection Inject 90 Units into the skin 3 (three) times daily.     Yes Historical Provider, MD  insulin lispro (HUMALOG) 100 UNIT/ML injection Inject 50 Units into the skin. Take 50 units five times / day (with meals and snacks)   Yes Historical Provider, MD  metolazone (ZAROXOLYN) 5 MG tablet Take 5 mg by mouth every Monday, Wednesday, and Friday.    Yes  Historical Provider, MD  mycophenolate (CELLCEPT) 250 MG capsule Take 750 mg by mouth 2 (two) times daily.    Yes Historical Provider, MD  omeprazole (PRILOSEC) 20 MG capsule Take 20 mg by mouth daily.   Yes Historical Provider, MD  potassium chloride (MICRO-K) 10 MEQ CR capsule Take 20 mEq by mouth 2 (two) times daily.    Yes Historical Provider, MD  predniSONE (DELTASONE) 5 MG tablet Take 5 mg by mouth daily.     Yes Historical Provider, MD  pregabalin (LYRICA) 50 MG capsule Take 1 capsule (50 mg total) by mouth at bedtime. 10/16/14  Yes Kathrynn Ducking, MD  rosuvastatin (CRESTOR) 20 MG tablet Take 20 mg by mouth at bedtime.    Yes Historical Provider, MD  tacrolimus (PROGRAF) 0.5 MG capsule Take 1 mg by mouth 2 (two) times daily.    Yes Historical Provider, MD  cyclobenzaprine (FLEXERIL) 10 MG tablet Take 1 tablet (10 mg total) by mouth 2 (two) times daily as needed for muscle spasms. 10/21/14   Charlesetta Shanks, MD  HYDROcodone-acetaminophen (NORCO/VICODIN) 5-325 MG per tablet Take 1-2 tablets by mouth 2 (two) times daily. Patient not taking: Reported on 10/21/2014 02/07/14   Kathrynn Ducking, MD  HYDROcodone-acetaminophen (NORCO/VICODIN) 5-325 MG per tablet Take 1-2 tablets by mouth every 6 (six) hours as needed for moderate pain or severe pain. 10/21/14   Charlesetta Shanks, MD  sildenafil (VIAGRA) 100 MG tablet Take 0.5 tablets (50 mg total) by mouth daily as needed for erectile dysfunction. 07/20/11 08/19/11  Burnell Blanks, MD   BP 124/55 mmHg  Pulse 76  Temp(Src) 98.7 F (37.1 C) (Oral)  Resp 16  SpO2 94% Physical Exam  Constitutional: He is oriented to person, place, and time.  The patient is morbidly obese. He is lying in the stretcher. He has no respiratory distress. His mental status is clear. He is nontoxic.  HENT:  Head: Normocephalic and atraumatic.  Right Ear: External ear normal.  Left Ear: External ear normal.  Nose: Nose normal.  Mouth/Throat: Oropharynx is clear and  moist. No oropharyngeal exudate.  Eyes: EOM are normal. Pupils are equal, round, and reactive to light.  Neck: Neck supple.  The patient does not have any meningismus. He cries out expresses severe pain to palpation along the anterior sternocleidomastoid muscle as well as over the top of the clavicle on the anterior chest. He also endorses severe pain to palpation of the paraspinous muscle body as well as the trapezius on the left. I cannot appreciate any palpable abnormality. There is no mass, no erythema he does have a small scar at the  base of the neck that is well-healed. I cannot appreciate any bruit or asymmetry to palpation of the carotid.  Cardiovascular: Normal rate, regular rhythm, normal heart sounds and intact distal pulses.   Pulmonary/Chest: Effort normal and breath sounds normal.  Abdominal: Soft. Bowel sounds are normal. He exhibits no distension. There is no tenderness.  Musculoskeletal: Normal range of motion. He exhibits no edema.  The upper extremities have symmetric strength and tone. Radial pulses are 2+ and symmetric. Grip strength is normal bilaterally. No sensory deficit to the upper extremities.  Neurological: He is alert and oriented to person, place, and time. He has normal strength. No cranial nerve deficit. He exhibits normal muscle tone. Coordination normal. GCS eye subscore is 4. GCS verbal subscore is 5. GCS motor subscore is 6.  Skin: Skin is warm, dry and intact.  Psychiatric:  Patient is very anxious.    ED Course  Procedures (including critical care time) Labs Review Labs Reviewed - No data to display  Imaging Review No results found.   EKG Interpretation None     13:56 pain improved with oral medications. Patient reports that at this time he is hungry and would like to eat something. He is turning his head easily from side to side without limitation.  14:30 the patient has had variable expressions of pain. Sometimes he is calling out and yelling  because the pain is so intense with a movement. Other times as I'm talking to him and he is focusing on the conversation, he is turning his head from side to side with no evidence of limitation or pain expression. As I passed by the room and observed the patient to have a time seen in with his head turned toward the right and looking towards the right in a position that previously he reported was severely painful. I have performed a repeat examination and the patient endorses pain simply to very light touch over the skin surfaces on the anterior chest and neck. MDM   Final diagnoses:  Neuralgia   After ongoing observation and repeat examinations, I do not find evidence of any neurologic compromise. Also there is no indication of a vascular injury. The quality and severity of the patient's pain with light touch and position are not consistent. At this time my suspicion is for neuropathic type of pain. The patient will be treated for pain and is counseled to call his neurologist on Monday as they have just recently made some medication changes and for reassessment.    Charlesetta Shanks, MD 10/21/14 [phone]

## 2014-10-21 NOTE — ED Notes (Signed)
Pt given turkey sandwich and cranberry juice. 

## 2014-10-21 NOTE — ED Notes (Signed)
Pt explained discharge instructions and that no tests would be ordered in the ED, and that pt needed to follow up with his neurologist. Pt said that he did not want to wait in the lobby for his ride and that he was going to scream. rn encouraged to pt to get dressed and follow discharge instructions.

## 2014-10-21 NOTE — ED Notes (Signed)
Patient Yelling in pain. States his neck just started burning after sitting up and eating his sandwich. He reached for his glasses and pain shot up neck. EDP notified

## 2014-10-21 NOTE — ED Notes (Signed)
Pt sts he went to the neurologist Wednesday, gave Rx for lyrica which he started Friday, switched from gabapentin. Nerve issues in L hand and legs 2/2 DM. Sts he was having no neck or shoulder pain when he saw the Dr Emmie Niemann. Describes pain now as burning. Similar to other nerve pain.

## 2014-10-21 NOTE — ED Notes (Signed)
Charge informed pt was not leaving, [employer] at bedside. Pt had not attempted to change into clothes. Pt stated he needed help. rn assisted and tech assisted. Wheelchair provided.

## 2014-10-22 ENCOUNTER — Telehealth: Payer: Self-pay | Admitting: Neurology

## 2014-10-22 NOTE — Telephone Encounter (Signed)
Pt is calling stating he went to the ER on Sunday and they would not give him anything for pain for his neck and shoulder.  He would like to get something called in for pain to [facility].  Please call and advise.

## 2014-10-22 NOTE — Telephone Encounter (Signed)
Patient is scheduled for 8am 10-23-14.

## 2014-10-22 NOTE — Telephone Encounter (Signed)
I called the patient. The patient indicated that about a week ago, he began having neck and shoulder discomfort, and he is having problems using his right hand. This apparently started after I saw him in the office. He has not had any injury. I will need to see him tomorrow morning at 8:00 to evaluate this issue.

## 2014-10-23 ENCOUNTER — Telehealth: Payer: Self-pay | Admitting: Neurology

## 2014-10-23 ENCOUNTER — Ambulatory Visit: Payer: Self-pay | Admitting: Neurology

## 2014-10-23 NOTE — Telephone Encounter (Signed)
This patient did not show for a urgent work in revisit appointment today.

## 2014-10-23 NOTE — Telephone Encounter (Signed)
Spoke to patient and he said he was in too much pain to get in the car to come to appointment this morning.  I didn't understand all he said but his shoulder and something else is hurting and all he relayed was that he is in too much pain.  He would like to speak to the doctor again, even though I relayed that the doctor needs to examine him first.

## 2014-10-23 NOTE — Telephone Encounter (Signed)
Pt called and states that he could not make appointment this morning, but he thinks he may go to [facility].  He would like for Butch Penny to give him a call back because he is in a lot of pain.  Please call and advise.

## 2014-10-23 NOTE — Telephone Encounter (Signed)
I called the patient. The patient was unable to show up for a revisit today as he indicated that the pain was too severe. He once again indicates that there is pain in the back of the neck, he has numbness of the right arm, he cannot use the right arm as there is weakness. He also believes that this is affecting his legs. He was seen in the emergency room on January 10, but I have indicated that he needs to have another evaluation, he may need an MRI of the cervical spine. He is to go back to the emergency room for an evaluation.

## 2014-10-24 ENCOUNTER — Inpatient Hospital Stay (HOSPITAL_COMMUNITY)
Admission: EM | Admit: 2014-10-24 | Discharge: 2014-10-30 | DRG: 683 | Disposition: A | Discharge status: Skilled Nursing Facility | Payer: Medicare Other | Attending: Family Medicine | Admitting: Family Medicine

## 2014-10-24 ENCOUNTER — Inpatient Hospital Stay (HOSPITAL_COMMUNITY): Payer: Medicare Other

## 2014-10-24 ENCOUNTER — Emergency Department (HOSPITAL_COMMUNITY): Payer: Medicare Other

## 2014-10-24 ENCOUNTER — Encounter (HOSPITAL_COMMUNITY): Payer: Self-pay | Admitting: Emergency Medicine

## 2014-10-24 DIAGNOSIS — Z7982 Long term (current) use of aspirin: Secondary | ICD-10-CM

## 2014-10-24 DIAGNOSIS — Z6835 Body mass index (BMI) 35.0-35.9, adult: Secondary | ICD-10-CM | POA: Diagnosis not present

## 2014-10-24 DIAGNOSIS — Z940 Kidney transplant status: Secondary | ICD-10-CM

## 2014-10-24 DIAGNOSIS — E785 Hyperlipidemia, unspecified: Secondary | ICD-10-CM | POA: Diagnosis not present

## 2014-10-24 DIAGNOSIS — E213 Hyperparathyroidism, unspecified: Secondary | ICD-10-CM | POA: Diagnosis not present

## 2014-10-24 DIAGNOSIS — R29898 Other symptoms and signs involving the musculoskeletal system: Secondary | ICD-10-CM | POA: Insufficient documentation

## 2014-10-24 DIAGNOSIS — N320 Bladder-neck obstruction: Secondary | ICD-10-CM | POA: Diagnosis present

## 2014-10-24 DIAGNOSIS — M549 Dorsalgia, unspecified: Secondary | ICD-10-CM | POA: Diagnosis not present

## 2014-10-24 DIAGNOSIS — B952 Enterococcus as the cause of diseases classified elsewhere: Secondary | ICD-10-CM | POA: Diagnosis present

## 2014-10-24 DIAGNOSIS — F419 Anxiety disorder, unspecified: Secondary | ICD-10-CM | POA: Diagnosis present

## 2014-10-24 DIAGNOSIS — S335XXA Sprain of ligaments of lumbar spine, initial encounter: Secondary | ICD-10-CM | POA: Diagnosis not present

## 2014-10-24 DIAGNOSIS — S37099D Other injury of unspecified kidney, subsequent encounter: Secondary | ICD-10-CM | POA: Diagnosis not present

## 2014-10-24 DIAGNOSIS — M5127 Other intervertebral disc displacement, lumbosacral region: Secondary | ICD-10-CM | POA: Diagnosis not present

## 2014-10-24 DIAGNOSIS — E669 Obesity, unspecified: Secondary | ICD-10-CM | POA: Diagnosis present

## 2014-10-24 DIAGNOSIS — E1142 Type 2 diabetes mellitus with diabetic polyneuropathy: Secondary | ICD-10-CM | POA: Diagnosis present

## 2014-10-24 DIAGNOSIS — Z794 Long term (current) use of insulin: Secondary | ICD-10-CM

## 2014-10-24 DIAGNOSIS — E0865 Diabetes mellitus due to underlying condition with hyperglycemia: Secondary | ICD-10-CM | POA: Diagnosis not present

## 2014-10-24 DIAGNOSIS — I10 Essential (primary) hypertension: Secondary | ICD-10-CM

## 2014-10-24 DIAGNOSIS — M62838 Other muscle spasm: Secondary | ICD-10-CM | POA: Diagnosis present

## 2014-10-24 DIAGNOSIS — N281 Cyst of kidney, acquired: Secondary | ICD-10-CM | POA: Diagnosis not present

## 2014-10-24 DIAGNOSIS — E860 Dehydration: Secondary | ICD-10-CM | POA: Diagnosis present

## 2014-10-24 DIAGNOSIS — R339 Retention of urine, unspecified: Secondary | ICD-10-CM | POA: Insufficient documentation

## 2014-10-24 DIAGNOSIS — Z880 Allergy status to penicillin: Secondary | ICD-10-CM

## 2014-10-24 DIAGNOSIS — M5126 Other intervertebral disc displacement, lumbar region: Secondary | ICD-10-CM | POA: Diagnosis present

## 2014-10-24 DIAGNOSIS — N189 Chronic kidney disease, unspecified: Secondary | ICD-10-CM

## 2014-10-24 DIAGNOSIS — J449 Chronic obstructive pulmonary disease, unspecified: Secondary | ICD-10-CM | POA: Diagnosis present

## 2014-10-24 DIAGNOSIS — Z7952 Long term (current) use of systemic steroids: Secondary | ICD-10-CM

## 2014-10-24 DIAGNOSIS — N179 Acute kidney failure, unspecified: Principal | ICD-10-CM | POA: Diagnosis present

## 2014-10-24 DIAGNOSIS — N289 Disorder of kidney and ureter, unspecified: Secondary | ICD-10-CM | POA: Diagnosis not present

## 2014-10-24 DIAGNOSIS — Z9109 Other allergy status, other than to drugs and biological substances: Secondary | ICD-10-CM

## 2014-10-24 DIAGNOSIS — N3289 Other specified disorders of bladder: Secondary | ICD-10-CM | POA: Diagnosis not present

## 2014-10-24 DIAGNOSIS — N185 Chronic kidney disease, stage 5: Secondary | ICD-10-CM | POA: Diagnosis not present

## 2014-10-24 DIAGNOSIS — D649 Anemia, unspecified: Secondary | ICD-10-CM | POA: Diagnosis present

## 2014-10-24 DIAGNOSIS — M545 Low back pain, unspecified: Secondary | ICD-10-CM | POA: Insufficient documentation

## 2014-10-24 DIAGNOSIS — D631 Anemia in chronic kidney disease: Secondary | ICD-10-CM | POA: Diagnosis not present

## 2014-10-24 DIAGNOSIS — G4733 Obstructive sleep apnea (adult) (pediatric): Secondary | ICD-10-CM | POA: Diagnosis present

## 2014-10-24 DIAGNOSIS — I120 Hypertensive chronic kidney disease with stage 5 chronic kidney disease or end stage renal disease: Secondary | ICD-10-CM | POA: Diagnosis not present

## 2014-10-24 DIAGNOSIS — N1330 Unspecified hydronephrosis: Secondary | ICD-10-CM | POA: Diagnosis present

## 2014-10-24 DIAGNOSIS — I129 Hypertensive chronic kidney disease with stage 1 through stage 4 chronic kidney disease, or unspecified chronic kidney disease: Secondary | ICD-10-CM | POA: Diagnosis present

## 2014-10-24 DIAGNOSIS — E1121 Type 2 diabetes mellitus with diabetic nephropathy: Secondary | ICD-10-CM | POA: Diagnosis present

## 2014-10-24 DIAGNOSIS — K3184 Gastroparesis: Secondary | ICD-10-CM | POA: Diagnosis present

## 2014-10-24 DIAGNOSIS — Z79891 Long term (current) use of opiate analgesic: Secondary | ICD-10-CM

## 2014-10-24 DIAGNOSIS — Z79899 Other long term (current) drug therapy: Secondary | ICD-10-CM

## 2014-10-24 DIAGNOSIS — N390 Urinary tract infection, site not specified: Secondary | ICD-10-CM | POA: Diagnosis present

## 2014-10-24 DIAGNOSIS — F4310 Post-traumatic stress disorder, unspecified: Secondary | ICD-10-CM | POA: Diagnosis present

## 2014-10-24 DIAGNOSIS — R944 Abnormal results of kidney function studies: Secondary | ICD-10-CM | POA: Diagnosis not present

## 2014-10-24 DIAGNOSIS — F411 Generalized anxiety disorder: Secondary | ICD-10-CM | POA: Diagnosis not present

## 2014-10-24 DIAGNOSIS — M5412 Radiculopathy, cervical region: Secondary | ICD-10-CM | POA: Diagnosis not present

## 2014-10-24 DIAGNOSIS — N186 End stage renal disease: Secondary | ICD-10-CM | POA: Diagnosis not present

## 2014-10-24 DIAGNOSIS — M542 Cervicalgia: Secondary | ICD-10-CM | POA: Diagnosis not present

## 2014-10-24 HISTORY — DX: Retention of urine, unspecified: R33.9

## 2014-10-24 HISTORY — DX: Unspecified urethral stricture, male, unspecified site: N35.919

## 2014-10-24 LAB — URINE MICROSCOPIC-ADD ON

## 2014-10-24 LAB — COMPREHENSIVE METABOLIC PANEL
ALT: 12 U/L (ref 0–53)
AST: 15 U/L (ref 0–37)
Albumin: 2.8 g/dL — ABNORMAL LOW (ref 3.5–5.2)
Alkaline Phosphatase: 88 U/L (ref 39–117)
Anion gap: 11 (ref 5–15)
BUN: 61 mg/dL — ABNORMAL HIGH (ref 6–23)
CO2: 22 mmol/L (ref 19–32)
Calcium: 8.3 mg/dL — ABNORMAL LOW (ref 8.4–10.5)
Chloride: 98 mEq/L (ref 96–112)
Creatinine, Ser: 3.18 mg/dL — ABNORMAL HIGH (ref 0.50–1.35)
GFR calc Af Amer: 25 mL/min — ABNORMAL LOW (ref >90)
GFR calc non Af Amer: 21 mL/min — ABNORMAL LOW (ref >90)
Glucose, Bld: 153 mg/dL — ABNORMAL HIGH (ref 70–99)
Potassium: 4.2 mmol/L (ref 3.5–5.1)
Sodium: 131 mmol/L — ABNORMAL LOW (ref 135–145)
Total Bilirubin: 1.1 mg/dL (ref 0.3–1.2)
Total Protein: 6.3 g/dL (ref 6.0–8.3)

## 2014-10-24 LAB — CBC
HCT: 30.8 % — ABNORMAL LOW (ref 39.0–52.0)
Hemoglobin: 9.3 g/dL — ABNORMAL LOW (ref 13.0–17.0)
MCH: 24.2 pg — ABNORMAL LOW (ref 26.0–34.0)
MCHC: 30.2 g/dL (ref 30.0–36.0)
MCV: 80.2 fL (ref 78.0–100.0)
Platelets: 304 10*3/uL (ref 150–400)
RBC: 3.84 MIL/uL — ABNORMAL LOW (ref 4.22–5.81)
RDW: 15.7 % — ABNORMAL HIGH (ref 11.5–15.5)
WBC: 9.2 10*3/uL (ref 4.0–10.5)

## 2014-10-24 LAB — GLUCOSE, CAPILLARY: Glucose-Capillary: 163 mg/dL — ABNORMAL HIGH (ref 70–99)

## 2014-10-24 LAB — URINALYSIS, ROUTINE W REFLEX MICROSCOPIC
Bilirubin Urine: NEGATIVE
Glucose, UA: NEGATIVE mg/dL
Hgb urine dipstick: NEGATIVE
Ketones, ur: NEGATIVE mg/dL
Nitrite: NEGATIVE
Protein, ur: NEGATIVE mg/dL
Specific Gravity, Urine: 1.013 (ref 1.005–1.030)
Urobilinogen, UA: 0.2 mg/dL (ref 0.0–1.0)
pH: 5 (ref 5.0–8.0)

## 2014-10-24 LAB — RETICULOCYTES
RBC.: 2.67 MIL/uL — ABNORMAL LOW (ref 4.22–5.81)
Retic Count, Absolute: 61.4 10*3/uL (ref 19.0–186.0)
Retic Ct Pct: 2.3 % (ref 0.4–3.1)

## 2014-10-24 LAB — LIPASE, BLOOD: Lipase: 31 U/L (ref 11–59)

## 2014-10-24 LAB — CK: Total CK: 38 U/L (ref 7–232)

## 2014-10-24 MED ORDER — LORAZEPAM 2 MG/ML IJ SOLN
1.0000 mg | INTRAMUSCULAR | Status: DC | PRN
  Administered 2014-10-26: 1 mg via INTRAVENOUS
  Filled 2014-10-24: qty 1

## 2014-10-24 MED ORDER — SODIUM CHLORIDE 0.9 % IV BOLUS (SEPSIS)
500.0000 mL | Freq: Once | INTRAVENOUS | Status: AC
  Administered 2014-10-24: 500 mL via INTRAVENOUS

## 2014-10-24 MED ORDER — HYDROCODONE-ACETAMINOPHEN 5-325 MG PO TABS: 1.0000 | ORAL_TABLET | Freq: Four times a day (QID) | ORAL | Status: DC | PRN

## 2014-10-24 MED ORDER — PREDNISONE 5 MG PO TABS
5.0000 mg | ORAL_TABLET | Freq: Every day | ORAL | Status: DC
  Administered 2014-10-24 – 2014-10-30 (×7): 5 mg via ORAL
  Filled 2014-10-24 (×7): qty 1

## 2014-10-24 MED ORDER — HYDRALAZINE HCL 20 MG/ML IJ SOLN: 10.0000 mg | Freq: Four times a day (QID) | INTRAMUSCULAR | Status: DC | PRN

## 2014-10-24 MED ORDER — ONDANSETRON HCL 4 MG/2ML IJ SOLN: 4.0000 mg | Freq: Four times a day (QID) | INTRAMUSCULAR | Status: DC | PRN

## 2014-10-24 MED ORDER — SODIUM CHLORIDE 0.9 % IJ SOLN: 3.0000 mL | INTRAMUSCULAR | Status: DC | PRN

## 2014-10-24 MED ORDER — PANTOPRAZOLE SODIUM 40 MG PO TBEC
40.0000 mg | DELAYED_RELEASE_TABLET | Freq: Every day | ORAL | Status: DC
Start: 2014-10-24 — End: 2014-10-30
  Administered 2014-10-24 – 2014-10-30 (×7): 40 mg via ORAL
  Filled 2014-10-24 (×5): qty 1

## 2014-10-24 MED ORDER — MYCOPHENOLATE MOFETIL 250 MG PO CAPS
750.0000 mg | ORAL_CAPSULE | Freq: Two times a day (BID) | ORAL | Status: DC
  Administered 2014-10-24 – 2014-10-30 (×12): 750 mg via ORAL
  Filled 2014-10-24 (×13): qty 3

## 2014-10-24 MED ORDER — INSULIN ASPART 100 UNIT/ML ~~LOC~~ SOLN
30.0000 [IU] | Freq: Three times a day (TID) | SUBCUTANEOUS | Status: DC
  Administered 2014-10-25: 30 [IU] via SUBCUTANEOUS

## 2014-10-24 MED ORDER — SODIUM CHLORIDE 0.9 % IJ SOLN
3.0000 mL | Freq: Two times a day (BID) | INTRAMUSCULAR | Status: DC
  Administered 2014-10-24 – 2014-10-29 (×10): 3 mL via INTRAVENOUS

## 2014-10-24 MED ORDER — ENSURE COMPLETE PO LIQD
237.0000 mL | Freq: Two times a day (BID) | ORAL | Status: DC
  Administered 2014-10-26 – 2014-10-30 (×8): 237 mL via ORAL

## 2014-10-24 MED ORDER — INSULIN ASPART 100 UNIT/ML ~~LOC~~ SOLN
0.0000 [IU] | Freq: Three times a day (TID) | SUBCUTANEOUS | Status: DC
  Administered 2014-10-25: 2 [IU] via SUBCUTANEOUS
  Administered 2014-10-26: 5 [IU] via SUBCUTANEOUS
  Administered 2014-10-27: 2 [IU] via SUBCUTANEOUS

## 2014-10-24 MED ORDER — TACROLIMUS 1 MG PO CAPS
1.0000 mg | ORAL_CAPSULE | Freq: Two times a day (BID) | ORAL | Status: DC
  Administered 2014-10-24 – 2014-10-30 (×12): 1 mg via ORAL
  Filled 2014-10-24 (×13): qty 1

## 2014-10-24 MED ORDER — FENTANYL CITRATE 0.05 MG/ML IJ SOLN
25.0000 ug | Freq: Once | INTRAMUSCULAR | Status: AC
  Administered 2014-10-24: 25 ug via INTRAVENOUS
  Filled 2014-10-24: qty 2

## 2014-10-24 MED ORDER — INSULIN GLARGINE 100 UNIT/ML ~~LOC~~ SOLN
60.0000 [IU] | Freq: Three times a day (TID) | SUBCUTANEOUS | Status: DC
  Administered 2014-10-24 – 2014-10-25 (×2): 60 [IU] via SUBCUTANEOUS
  Filled 2014-10-24 (×4): qty 0.6

## 2014-10-24 MED ORDER — ACETAMINOPHEN 650 MG RE SUPP: 650.0000 mg | Freq: Four times a day (QID) | RECTAL | Status: DC | PRN

## 2014-10-24 MED ORDER — ONDANSETRON HCL 4 MG/2ML IJ SOLN
4.0000 mg | Freq: Once | INTRAMUSCULAR | Status: AC
  Administered 2014-10-24: 4 mg via INTRAVENOUS
  Filled 2014-10-24: qty 2

## 2014-10-24 MED ORDER — ASPIRIN 325 MG PO TABS
325.0000 mg | ORAL_TABLET | Freq: Every day | ORAL | Status: DC
Start: 2014-10-25 — End: 2014-10-30
  Administered 2014-10-25 – 2014-10-30 (×6): 325 mg via ORAL
  Filled 2014-10-24 (×6): qty 1

## 2014-10-24 MED ORDER — HYDROMORPHONE HCL 1 MG/ML IJ SOLN
0.5000 mg | Freq: Once | INTRAMUSCULAR | Status: AC
  Administered 2014-10-24: 0.5 mg via INTRAVENOUS
  Filled 2014-10-24: qty 1

## 2014-10-24 MED ORDER — ONDANSETRON HCL 4 MG PO TABS
4.0000 mg | ORAL_TABLET | Freq: Four times a day (QID) | ORAL | Status: DC | PRN
  Administered 2014-10-26: 4 mg via ORAL
  Filled 2014-10-24: qty 1

## 2014-10-24 MED ORDER — SODIUM CHLORIDE 0.9 % IV SOLN: 250.0000 mL | INTRAVENOUS | Status: DC | PRN

## 2014-10-24 MED ORDER — HYDROMORPHONE HCL 1 MG/ML IJ SOLN
0.5000 mg | Freq: Once | INTRAMUSCULAR | Status: AC
Start: 2014-10-24 — End: 2014-10-24
  Administered 2014-10-24: 0.5 mg via INTRAVENOUS
  Filled 2014-10-24: qty 1

## 2014-10-24 MED ORDER — ACETAMINOPHEN 325 MG PO TABS: 650.0000 mg | ORAL_TABLET | Freq: Four times a day (QID) | ORAL | Status: DC | PRN

## 2014-10-24 MED ORDER — ROSUVASTATIN CALCIUM 20 MG PO TABS
20.0000 mg | ORAL_TABLET | Freq: Every day | ORAL | Status: DC
  Administered 2014-10-24 – 2014-10-29 (×6): 20 mg via ORAL
  Filled 2014-10-24 (×7): qty 1

## 2014-10-24 MED ORDER — MORPHINE SULFATE 2 MG/ML IJ SOLN
1.0000 mg | INTRAMUSCULAR | Status: DC | PRN
  Administered 2014-10-24: 1 mg via INTRAVENOUS
  Filled 2014-10-24: qty 1

## 2014-10-24 MED ORDER — HYDROCODONE-ACETAMINOPHEN 5-325 MG PO TABS
2.0000 | ORAL_TABLET | Freq: Four times a day (QID) | ORAL | Status: DC | PRN
  Administered 2014-10-25 – 2014-10-30 (×9): 2 via ORAL
  Filled 2014-10-24 (×12): qty 2

## 2014-10-24 MED ORDER — CLONAZEPAM 0.5 MG PO TABS
0.5000 mg | ORAL_TABLET | Freq: Three times a day (TID) | ORAL | Status: DC | PRN
  Administered 2014-10-27 – 2014-10-30 (×5): 0.5 mg via ORAL
  Filled 2014-10-24 (×5): qty 1

## 2014-10-24 MED ORDER — MORPHINE SULFATE 2 MG/ML IJ SOLN
1.0000 mg | INTRAMUSCULAR | Status: DC | PRN
  Administered 2014-10-25 – 2014-10-27 (×11): 1 mg via INTRAVENOUS
  Filled 2014-10-24 (×11): qty 1

## 2014-10-24 NOTE — ED Notes (Signed)
This RN phoned MRI regarding status, patient is next but waiting for another patients exam to complete.  Patient made aware of his status.

## 2014-10-24 NOTE — ED Notes (Signed)
This RN went into pt room, pt oxygen noted to be 88% on RA. Pt placed on nasal cannula at 2L and oxygen increased to 97%. Pt axo, no lethargy noted. Primary RN notified.

## 2014-10-24 NOTE — Progress Notes (Signed)
10/24/14 23:25 Bladder scan revealed 808 cc.Order to place foley catheter. 23:30 Foley catheter not placed,patient was able to urinate 700 cc.Will continue to monitor.Incoming RN made aware. Railyn House, Wonda Cheng, Therapist, sports

## 2014-10-24 NOTE — Progress Notes (Signed)
Paged on-call Internal Medicine Dr. Ree Kida for admit and admission nurse.

## 2014-10-24 NOTE — Progress Notes (Signed)
Patient c/o pain on  rt flank area,10/10 morphine iv given was unable to relieve pain.Patient also c/o of inability to urinate.Call made to MD on call,order received to place foley additional pain med.Will continue to monitor. Shalimar Mcclain, Wonda Cheng, Therapist, sports

## 2014-10-24 NOTE — ED Provider Notes (Signed)
CSN: [id number]     Arrival date & time 10/24/14  [id number] History   First MD Initiated Contact with Patient 10/24/14 1009     Chief Complaint  Patient presents with  . Back Pain  . Shoulder Pain  . Neck Pain     (Consider location/radiation/quality/duration/timing/severity/associated sxs/prior Treatment) HPI Comments: The patient is a 49 year old male with a past medical history of diabetes, chronic kidney disease status post renal transplant, diabetic peripheral neuropathy, OS A, gout, morbid obesity, COPD, presenting to the emergency room chief complaint of neck pain and back pain for one week. Patient reports low back pain and right-sided neck pain for one week, denies recent injury or trauma. Patient reports pain worsened with movement. Patient denies saddle paresthesias, urinary retention or incontinence, bowel incontinence. Patient reports bilateral lower leg paresthesias, chronic. Patient also complains of left lower extremity weakness, chronic unchanged. Baseline Cr. 2.1. NO PCP Nephrologist: Lorrene Reid, [facility]  Patient is a 49 y.o. male presenting with back pain, shoulder pain, and neck pain. The history is provided by the patient. No language interpreter was used.  Back Pain Associated symptoms: no abdominal pain, no dysuria and no fever   Shoulder Pain Associated symptoms: back pain and neck pain   Associated symptoms: no fever   Neck Pain Associated symptoms: no fever     Past Medical History  Diagnosis Date  . Diabetes mellitus   . Hypertension   . Leg pain 02/05/11    with walking  . Weight gain   . Shortness of breath     when lying flat  . Diminished eyesight   . Hearing difficulty   . Chronic kidney disease     Dia;ysis MWF  . Anemia   . Nervousness(799.21)   . COPD (chronic obstructive pulmonary disease)   . CHF (congestive heart failure)   . Posttraumatic stress disorder   . Allergic rhinitis   . Dyslipidemia   . Morbid obesity   .  Diabetic peripheral neuropathy   . Gait disorder   . Obstructive sleep apnea   . Bilateral carpal tunnel syndrome   . Anxiety disorder   . Gastroparesis   . Gout   . Diabetic retinopathy   . Renal transplant, status post 04/05/2014   Past Surgical History  Procedure Laterality Date  . Kidney transplant    . Eye surgery    . Av fistula placement      left  . Multiple av graft placements     Family History  Problem Relation Age of Onset  . Hypertension Mother   . Diabetes Father    History  Substance Use Topics  . Smoking status: Never Smoker   . Smokeless tobacco: Never Used  . Alcohol Use: No    Review of Systems  Constitutional: Positive for appetite change. Negative for fever and chills.  Gastrointestinal: Positive for constipation. Negative for nausea, vomiting, abdominal pain, diarrhea and blood in stool.  Genitourinary: Negative for dysuria and hematuria.  Musculoskeletal: Positive for back pain and neck pain.      Allergies  Penicillins; Pork-derived products; and Ace inhibitors  Home Medications   Prior to Admission medications   Medication Sig Start Date End Date Taking? Authorizing Provider  allopurinol (ZYLOPRIM) 100 MG tablet Take 100 mg by mouth daily. Take with the 300mg  tablet to make a total of 400mg  daily per patient   Yes Historical Provider, MD  allopurinol (ZYLOPRIM) 300 MG tablet Take 300 mg by mouth daily. Take with  the 100mg  tablet to make a total dose of 400mg  a day per patient   Yes Historical Provider, MD  aspirin 325 MG tablet Take 325 mg by mouth daily.    Yes Historical Provider, MD  calcitRIOL (ROCALTROL) 0.5 MCG capsule Take 0.5 mcg by mouth at bedtime.    Yes Historical Provider, MD  clonazePAM (KLONOPIN) 0.5 MG tablet Take 0.5 mg by mouth 3 (three) times daily as needed for anxiety.    Yes Historical Provider, MD  cloNIDine (CATAPRES) 0.1 MG tablet Take 0.1 mg by mouth daily as needed. Take only if blood pressure is high   Yes  Historical Provider, MD  cyclobenzaprine (FLEXERIL) 10 MG tablet Take 1 tablet (10 mg total) by mouth 2 (two) times daily as needed for muscle spasms. 10/21/14  Yes Charlesetta Shanks, MD  furosemide (LASIX) 40 MG tablet Take 80 mg by mouth 2 (two) times daily.    Yes Historical Provider, MD  HYDROcodone-acetaminophen (NORCO/VICODIN) 5-325 MG per tablet Take 1-2 tablets by mouth every 6 (six) hours as needed for moderate pain or severe pain. 10/21/14  Yes Charlesetta Shanks, MD  insulin glargine (LANTUS) 100 UNIT/ML injection Inject 90 Units into the skin 3 (three) times daily.     Yes Historical Provider, MD  CIALIS 5 MG tablet Take 5 mg by mouth daily as needed for erectile dysfunction.  06/05/14   Historical Provider, MD  HYDROcodone-acetaminophen (NORCO/VICODIN) 5-325 MG per tablet Take 1-2 tablets by mouth 2 (two) times daily. Patient not taking: Reported on 10/21/2014 02/07/14   Kathrynn Ducking, MD  insulin lispro (HUMALOG) 100 UNIT/ML injection Inject 50 Units into the skin. Take 50 units five times / day (with meals and snacks)    Historical Provider, MD  metolazone (ZAROXOLYN) 5 MG tablet Take 5 mg by mouth every Monday, Wednesday, and Friday.     Historical Provider, MD  mycophenolate (CELLCEPT) 250 MG capsule Take 750 mg by mouth 2 (two) times daily.     Historical Provider, MD  omeprazole (PRILOSEC) 20 MG capsule Take 20 mg by mouth daily.    Historical Provider, MD  potassium chloride (MICRO-K) 10 MEQ CR capsule Take 20 mEq by mouth 2 (two) times daily.     Historical Provider, MD  predniSONE (DELTASONE) 5 MG tablet Take 5 mg by mouth daily.      Historical Provider, MD  pregabalin (LYRICA) 50 MG capsule Take 1 capsule (50 mg total) by mouth at bedtime. 10/16/14   Kathrynn Ducking, MD  rosuvastatin (CRESTOR) 20 MG tablet Take 20 mg by mouth at bedtime.     Historical Provider, MD  sildenafil (VIAGRA) 100 MG tablet Take 0.5 tablets (50 mg total) by mouth daily as needed for erectile dysfunction.  07/20/11 08/19/11  Burnell Blanks, MD  tacrolimus (PROGRAF) 0.5 MG capsule Take 1 mg by mouth 2 (two) times daily.     Historical Provider, MD   BP 149/61 mmHg  Pulse 96  Temp(Src) 98.7 F (37.1 C) (Oral)  Resp 18  Ht 6' (1.829 m)  Wt 261 lb (118.389 kg)  BMI 35.39 kg/m2  SpO2 98% Physical Exam  Constitutional: He is oriented to person, place, and time. He appears well-developed and well-nourished. No distress.  Obese male  HENT:  Head: Normocephalic and atraumatic.  Neck: Neck supple. No rigidity. Normal range of motion present.    Positive Spurling's test.  Cardiovascular: Normal rate and regular rhythm.   Left upper extremity fistula good palpable thrill. Mild Right  lower extremity edema.  Pulmonary/Chest: Effort normal and breath sounds normal. He has no wheezes. He has no rales.  Abdominal: Soft. There is tenderness. There is no rebound and no guarding.  Pt reports back pain with palpation of lower abdomen.  Neurological: He is alert and oriented to person, place, and time. GCS eye subscore is 4. GCS verbal subscore is 5. GCS motor subscore is 6.  Reflex Scores:      Bicep reflexes are 2+ on the right side and 2+ on the left side. Upper extremities: Equal grip strength, equal sensation to bilateral extremities.   Lower extremities: Decrease sensation in LLE.  Decrease strength in LLE.  Skin: Skin is warm and dry. He is not diaphoretic.  Psychiatric: He has a normal mood and affect. His behavior is normal.  Nursing note and vitals reviewed.   ED Course  Procedures (including critical care time) Labs Review Labs Reviewed  CBC - Abnormal; Notable for the following:    RBC 3.84 (*)    Hemoglobin 9.3 (*)    HCT 30.8 (*)    MCH 24.2 (*)    RDW 15.7 (*)    All other components within normal limits  COMPREHENSIVE METABOLIC PANEL - Abnormal; Notable for the following:    Sodium 131 (*)    Glucose, Bld 153 (*)    BUN 61 (*)    Creatinine, Ser 3.18 (*)     Calcium 8.3 (*)    Albumin 2.8 (*)    GFR calc non Af Amer 21 (*)    GFR calc Af Amer 25 (*)    All other components within normal limits  URINALYSIS, ROUTINE W REFLEX MICROSCOPIC - Abnormal; Notable for the following:    APPearance HAZY (*)    Leukocytes, UA SMALL (*)    All other components within normal limits  URINE MICROSCOPIC-ADD ON - Abnormal; Notable for the following:    Squamous Epithelial / LPF FEW (*)    Bacteria, UA FEW (*)    Casts HYALINE CASTS (*)    All other components within normal limits  URINE CULTURE  LIPASE, BLOOD    Imaging Review Mr Lumbar Spine Wo Contrast  10/24/2014   CLINICAL DATA:  Low back pain rated 10/10. Severe burning pain in the right lateral aspect of his neck.  EXAM: MRI LUMBAR SPINE WITHOUT CONTRAST  TECHNIQUE: Multiplanar, multisequence MR imaging of the lumbar spine was performed. No intravenous contrast was administered.  COMPARISON:  CT of the abdomen and pelvis 12/30/2009  FINDINGS: Normal signal is present in the conus medullaris which terminates at L1, within normal limits. T1 marrow signal is diffusely depressed. Template edematous endplate changes are present at L5-S1. No other focal signal changes are present. Vertebral body heights and alignment are normal.  Limited imaging of the abdomen is unremarkable.  The disc levels at L3-4 and above scratch the disc levels at L2-3 and above are normal.  L3-4: Mild leftward disc bulging is present without significant stenosis.  L4-5: A leftward disc protrusion is present. There is an annular tear. Mild subarticular narrowing is worse left than right. Mild foraminal narrowing is present bilaterally as well.  L5-S1: A leftward disc protrusion is present. Prominent epidural fat is evident. The disc protrudes anteriorly as well. The combination results in mild left subarticular narrowing. The foramina are patent.  IMPRESSION: 1. Leftward disc protrusion and prominent epidural fat at L5-S1 with mild left  subarticular stenosis. 2. Leftward disc protrusion and annular tear at L4-5  with mild subarticular stenosis bilaterally, left greater than right. 3. Mild foraminal narrowing bilaterally at L4-5 as well.   Electronically Signed   By: Lawrence Santiago M.D.   On: 10/24/2014 16:11     EKG Interpretation None      MDM   Final diagnoses:  Low back pain  Acute kidney injury  H/O kidney transplant  Cervical radiculopathy  Anemia due to chronic kidney disease   Patient with right neck pain and upper extremity discomfort and lower back pain. Neck discomfort likely secondary to radiculopathy, no neurologic deficits or decreased strength on exam. I don't feel that emergent MRI is indicated at this time. Patient has decrease in sensation and strength to the left lower extremity this is chronic however given patient's history of worsening low back pain and immunocompromised state. That further imaging is indicated at this time. Discussed with Dr. Vanita Panda who also evaluated patient during this encounter, agrees with plan to MRI. Labs show elevation of creatinine 3.18, from baseline 2.1.  Patient will need admission for acute kidney injury s/p renal transplant. Reevaluation patient reports mild resolution with pain medicine, will give another dose of medication. Awaiting MRI results. MRI shows stenosis and mild stenosis, and foraminal narrowing no emergent therapy indicated at this time. 1620 reevaluation patient lying in room discussed MRI results and lab work and need for admission. Patient reports persistent discomfort medication ordered. 4:34 PM Discussed with [place], will admit for [facility]  Meds given in ED:  Medications  HYDROmorphone (DILAUDID) injection 0.5 mg (not administered)  HYDROmorphone (DILAUDID) injection 0.5 mg (0.5 mg Intravenous Given 10/24/14 1115)  fentaNYL (SUBLIMAZE) injection 25 mcg (25 mcg Intravenous Given 10/24/14 1315)  ondansetron (ZOFRAN) injection 4 mg (4 mg Intravenous  Given 10/24/14 1350)    New Prescriptions   No medications on file     Harvie Heck, PA-C 10/24/14 1638

## 2014-10-24 NOTE — H&P (Signed)
[facility] Admission History and Physical Service Pager: [phone]  Patient name: [name] [name] Medical record number: [id number] Date of birth: 11/25/1965 Age: 49 y.o. Gender: male  Primary Care Provider: No PCP Per Patient Consultants: Nephrology Code Status: Full  Chief Complaint: AKI  Assessment and Plan: [name] [name] is a 49 y.o. male presenting with back and neck pain, noted to have AKI with Creatinine of 3.18. PMH is significant for CKD s/p renal transplant, HTN, DM type 2, COPD, anxiety, and hyperlipidemia.  # AKI with h/o CKD s/p renal transplant: History of renal transplant in 2009. States last visited nephrologist in December 2015. Has not missed dose of transplant medications, which includes Cellcept, Prednisone, and Prograf. Has not had anything to eat or drink in 2 days. States he has been taking a lot of pain medication for his back pain recently. Baseline Creatinine 2.1. AKI likely due to dehydration vs post renal obstruction given recent urological procedure and mild hydronephroses noted on recent renal US (transplanted kidney in RLQ) Urinalysis few bacteria, small leukocytes, hyaline casts - Admitted to [place] attending - Creatinine 3.18 at admission.  BUN 61. GFR 25. - Follow up CK - Follow up UDS - Follow up Urine Culture - Follow up renal ultrasound - 1L bolus of NS - Avoid nephrotoxic medications - Continue Cellcept, Prednisone, and Prograf - Holding Lasix 80mg  BID - Consult renal in am given Hx of transplant - Repeat renal panel in am  # Anemia: - Hemoglobin 9.3 today - FOBT pending - Follow up anemia panel - Follow up CBC in am - Continue to monitor  # Muscle Spasm: - MRI lumbar spine- disc protrusions L4-L5 and L5-S1 with stenosis - Holding home muscle relaxers due to AKI - Morphine 1mg  q3hrs PRN pain, Acetaminophen 650mg  q6hr PRN - Ativan Lorazepam 1mg  q4hr PRN muscle spasm  # HTN: BP  152/63 today. Home medication includes Clonodine 0.1mg  PRN elevated BP - Clonidine held - Hydralazine PRN SBP >180 or DBP >110 - Continue to monitor  # DM: Glucose 153 in ED. Last A1C 8.7 in 2011. Home medications include Lantus 90U TID and Novolog 50U five times a day. - Discussed diabetes management with pharmacy.   - Sensitive Insulin Sliding Scale  - Novolog 30Units TID with meals  - Lantus 60U TID - CBGs q4hr for first day as insulin regimen is adjusted  # Anxiety: Home medication includes Klonopin 0.5mg  TID PRN anxiety - Also giving Ativan for muscle spasm - Continue home medication  FEN/GI: Heart Healthy/Carb Modified Diet Prophylaxis: SCDs (Allergic to Pork Derived Products with Anaphylaxis)  Disposition: Admitted to [place] Attending.   History of Present Illness: [name] [name] is a 49 y.o. male presenting with back, neck, and shoulder pain and noted to have AKI with Creatining of 3.18 in ED. States neck and shoulder pain has resolved. Severe back pain continues. Pain has been present since Sunday, 1/10. Denies any history of injury or trauma. Denies any similar episodes in past. Pain worse with movement and is located over the right side. Denies any saddle anesthesia or incontinence. Notes one area of numbness on outside of left leg. States pain is 10/10 with movement and while it is better when lying still it never goes away. States he has taken many tablets of narcotics in the past few days, which he believes may be contributing to his acute kidney injury. States he has not had anything to eat or  drink in two days, however he is very hungry today and wants a diet.   Recently seen at [facility] on 1/10 for right sided neck pain that radiates to right shoulder. Denied history of injury or trauma at that visit as well. ED physician believed pain was neuropathic in nature and noted inconsistencies in exam (on exam would be extremely tender and painful  but would move head without pain when distracted). Instructed to call his neurologist and follow up with them.  Review Of Systems: Per HPI  Otherwise 12 point review of systems was performed and was unremarkable.  Patient Active Problem List   Diagnosis Date Noted  . Acute kidney injury 10/24/2014  . Diabetic peripheral neuropathy 10/16/2014  . Renal transplant, status post 04/05/2014  . OSA (obstructive sleep apnea) 04/05/2014  . Severe obesity (BMI >= 40) 04/05/2014  . Benign fibroma of prostate 02/05/2014  . Abdominal apron 04/08/2012  . Chronic kidney failure 03/28/2012  . Benign essential HTN 03/24/2012  . H/O kidney transplant 03/24/2012  . Diabetic macular edema 01/15/2012  . Diabetes mellitus, type 2 01/15/2012  . Diabetic proliferative retinopathy 01/15/2012  . HYPERTENSION, HEART CONTROLLED W/O ASSOC CHF 06/27/2010  . CHEST PAIN 06/03/2010  . IMMUNOCOMPROMISED 01/30/2010  . RECTAL PAIN 01/30/2010  . NAUSEA AND VOMITING 01/30/2010  . DIARRHEA 01/30/2010  . KIDNEY TRANSPLANTATION, HX OF 01/30/2010  . IDDM 01/27/2010  . HYPERPARATHYROIDISM, SECONDARY 01/27/2010  . GOUT 01/27/2010  . ANXIETY 01/27/2010  . GASTROPARESIS 01/27/2010  . END STAGE RENAL DISEASE 01/27/2010  . FROZEN LEFT SHOULDER 01/27/2010  . INSOMNIA 01/27/2010  . DIABETIC  RETINOPATHY 05/25/2008  . HYPERLIPIDEMIA 05/25/2008  . ERECTILE DYSFUNCTION 05/25/2008  . OBSTRUCTIVE SLEEP APNEA 05/25/2008  . Essential hypertension 05/25/2008   Past Medical History: Past Medical History  Diagnosis Date  . Diabetes mellitus   . Hypertension   . Leg pain 02/05/11    with walking  . Weight gain   . Shortness of breath     when lying flat  . Diminished eyesight   . Hearing difficulty   . Chronic kidney disease     Dia;ysis MWF  . Anemia   . Nervousness(799.21)   . COPD (chronic obstructive pulmonary disease)   . CHF (congestive heart failure)   . Posttraumatic stress disorder   . Allergic rhinitis   .  Dyslipidemia   . Morbid obesity   . Diabetic peripheral neuropathy   . Gait disorder   . Obstructive sleep apnea   . Bilateral carpal tunnel syndrome   . Anxiety disorder   . Gastroparesis   . Gout   . Diabetic retinopathy   . Renal transplant, status post 04/05/2014   Past Surgical History: Past Surgical History  Procedure Laterality Date  . Kidney transplant    . Eye surgery    . Av fistula placement      left  . Multiple av graft placements     Social History: History  Substance Use Topics  . Smoking status: Never Smoker   . Smokeless tobacco: Never Used  . Alcohol Use: No   Please also refer to relevant sections of EMR.  Family History: Family History  Problem Relation Age of Onset  . Hypertension Mother   . Diabetes Father    Allergies and Medications: Allergies  Allergen Reactions  . Penicillins Nausea Only and Other (See Comments)    Temp changes of body  . Pork-Derived Products Anaphylaxis and Other (See Comments)    Fever   .  Ace Inhibitors Cough   No current facility-administered medications on file prior to encounter.   Current Outpatient Prescriptions on File Prior to Encounter  Medication Sig Dispense Refill  . allopurinol (ZYLOPRIM) 100 MG tablet Take 100 mg by mouth daily. Take with the 300mg  tablet to make a total of 400mg  daily per patient    . allopurinol (ZYLOPRIM) 300 MG tablet Take 300 mg by mouth daily. Take with the 100mg  tablet to make a total dose of 400mg  a day per patient    . aspirin 325 MG tablet Take 325 mg by mouth daily.     . calcitRIOL (ROCALTROL) 0.5 MCG capsule Take 0.5 mcg by mouth at bedtime.     . clonazePAM (KLONOPIN) 0.5 MG tablet Take 0.5 mg by mouth 3 (three) times daily as needed for anxiety.     . cloNIDine (CATAPRES) 0.1 MG tablet Take 0.1 mg by mouth daily as needed. Take only if blood pressure is high    . cyclobenzaprine (FLEXERIL) 10 MG tablet Take 1 tablet (10 mg total) by mouth 2 (two) times daily as needed for  muscle spasms. 20 tablet 0  . furosemide (LASIX) 40 MG tablet Take 80 mg by mouth 2 (two) times daily.     Marland Kitchen HYDROcodone-acetaminophen (NORCO/VICODIN) 5-325 MG per tablet Take 1-2 tablets by mouth every 6 (six) hours as needed for moderate pain or severe pain. 10 tablet 0  . insulin glargine (LANTUS) 100 UNIT/ML injection Inject 90 Units into the skin 3 (three) times daily.      . mycophenolate (CELLCEPT) 250 MG capsule Take 750 mg by mouth 2 (two) times daily.     Marland Kitchen omeprazole (PRILOSEC) 20 MG capsule Take 20 mg by mouth daily.    . potassium chloride (MICRO-K) 10 MEQ CR capsule Take 20 mEq by mouth 2 (two) times daily.     . predniSONE (DELTASONE) 5 MG tablet Take 5 mg by mouth daily.      . pregabalin (LYRICA) 50 MG capsule Take 1 capsule (50 mg total) by mouth at bedtime. 30 capsule 2  . rosuvastatin (CRESTOR) 20 MG tablet Take 20 mg by mouth at bedtime.     . tacrolimus (PROGRAF) 0.5 MG capsule Take 1 mg by mouth 2 (two) times daily.     Marland Kitchen CIALIS 5 MG tablet Take 5 mg by mouth daily as needed for erectile dysfunction.     Marland Kitchen HYDROcodone-acetaminophen (NORCO/VICODIN) 5-325 MG per tablet Take 1-2 tablets by mouth 2 (two) times daily. (Patient not taking: Reported on 10/24/2014) 30 tablet 0  . insulin lispro (HUMALOG) 100 UNIT/ML injection Inject 50 Units into the skin. Take 50 units five times / day (with meals and snacks)    . metolazone (ZAROXOLYN) 5 MG tablet Take 5 mg by mouth every Monday, Wednesday, and Friday.     . sildenafil (VIAGRA) 100 MG tablet Take 0.5 tablets (50 mg total) by mouth daily as needed for erectile dysfunction. (Patient not taking: Reported on 10/24/2014) 5 tablet 5    Objective: BP 152/63 mmHg  Pulse 90  Temp(Src) 98.7 F (37.1 C) (Oral)  Resp 20  Ht 6' (1.829 m)  Wt 261 lb (118.389 kg)  BMI 35.39 kg/m2  SpO2 99% Exam: General: 49yo male in mild distress secondary to pain HEENT: PERRLA. Dry mucous membranes. Cardiovascular: S1 and S2 noted. No  murmurs/rubs/gallops. Regular rate and rhythm. Respiratory: Clear to auscultation bilaterally. No wheezes/rales/rhonci. Abdomen: Bowel sounds noted. Soft and nondistended. No tenderness or masses with  palpation. No CVA tenderness noted. Muskuloskeletal: No edema noted. Negative straight leg raises bilaterally. Pain with active movement of legs bilaterally and back. No decreased sensation noted. No midline tenderness. No cervical or shoulder tenderness noted with palpation or movement. Spurlings negative.  Skin: No rashes noted. Warm. Neuro: No focal deficits.   Labs and Imaging: CBC BMET   Recent Labs Lab 10/24/14 1045  WBC 9.2  HGB 9.3*  HCT 30.8*  PLT 304    Recent Labs Lab 10/24/14 1045  NA 131*  K 4.2  CL 98  CO2 22  BUN 61*  CREATININE 3.18*  GLUCOSE 153*  CALCIUM 8.3*     Urinalysis    Component Value Date/Time   COLORURINE YELLOW 10/24/2014 1223   APPEARANCEUR HAZY* 10/24/2014 1223   LABSPEC 1.013 10/24/2014 1223   PHURINE 5.0 10/24/2014 [place] 10/24/2014 [place] 10/24/2014 [place] 10/24/2014 [place] 10/24/2014 1223   PROTEINUR NEGATIVE 10/24/2014 1223   UROBILINOGEN 0.2 10/24/2014 1223   NITRITE NEGATIVE 10/24/2014 1223   LEUKOCYTESUR SMALL* 10/24/2014 1223   CK: wnl Lipase: wnl    Mr Lumbar Spine Wo Contrast  10/24/2014   CLINICAL DATA:  Low back pain rated 10/10. Severe burning pain in the right lateral aspect of his neck.  EXAM: MRI LUMBAR SPINE WITHOUT CONTRAST  TECHNIQUE: Multiplanar, multisequence MR imaging of the lumbar spine was performed. No intravenous contrast was administered.  COMPARISON:  CT of the abdomen and pelvis 12/30/2009  FINDINGS: Normal signal is present in the conus medullaris which terminates at L1, within normal limits. T1 marrow signal is diffusely depressed. Template edematous endplate changes are present at L5-S1. No other focal signal changes are present.  Vertebral body heights and alignment are normal.  Limited imaging of the abdomen is unremarkable.  The disc levels at L3-4 and above scratch the disc levels at L2-3 and above are normal.  L3-4: Mild leftward disc bulging is present without significant stenosis.  L4-5: A leftward disc protrusion is present. There is an annular tear. Mild subarticular narrowing is worse left than right. Mild foraminal narrowing is present bilaterally as well.  L5-S1: A leftward disc protrusion is present. Prominent epidural fat is evident. The disc protrudes anteriorly as well. The combination results in mild left subarticular narrowing. The foramina are patent.  IMPRESSION: 1. Leftward disc protrusion and prominent epidural fat at L5-S1 with mild left subarticular stenosis. 2. Leftward disc protrusion and annular tear at L4-5 with mild subarticular stenosis bilaterally, left greater than right. 3. Mild foraminal narrowing bilaterally at L4-5 as well.   Electronically Signed   By: Lawrence Santiago M.D.   On: 10/24/2014 16:11   Lorna Few, DO 10/24/2014, 5:40 PM PGY-1, [place] Intern pager: [phone], text pages welcome  I have read and agree with the amended note as above. Phill Myron, MD, PGY-2 11:53 PM

## 2014-10-24 NOTE — Progress Notes (Signed)
Dr Gerlean Ren, Internal Medicine returned page, will be up soon to see patient.

## 2014-10-24 NOTE — ED Notes (Signed)
Pt returned from MRI °

## 2014-10-24 NOTE — Progress Notes (Signed)
Internal medicine out of room, pt ordered dinner tray.

## 2014-10-24 NOTE — Progress Notes (Signed)
Pt admitted to room 6E02 from ED via stretcher.  VSS. Back pain upon transfer.  Pt given 0.5 mg IV diluadid prior to transfer from ED.  Pt alert and oriented.

## 2014-10-24 NOTE — ED Notes (Signed)
Updated PA Jerline Pain regarding patients Creatinine of 3.18, MRI would be done without Contrast.  Plan to transport around 1300.

## 2014-10-24 NOTE — ED Notes (Signed)
EMS - Patient coming from home with c/o of ongoing back, shoulder and neck pain.  Patient was seen at [facility] on 10/21/14.  148/80 BP, 88 HR and 204 CBG.

## 2014-10-24 NOTE — Progress Notes (Signed)
Internal Medicine in to see patient.

## 2014-10-25 DIAGNOSIS — M545 Low back pain, unspecified: Secondary | ICD-10-CM | POA: Insufficient documentation

## 2014-10-25 DIAGNOSIS — M5412 Radiculopathy, cervical region: Secondary | ICD-10-CM | POA: Insufficient documentation

## 2014-10-25 DIAGNOSIS — N189 Chronic kidney disease, unspecified: Secondary | ICD-10-CM | POA: Insufficient documentation

## 2014-10-25 DIAGNOSIS — N179 Acute kidney failure, unspecified: Secondary | ICD-10-CM | POA: Insufficient documentation

## 2014-10-25 DIAGNOSIS — D631 Anemia in chronic kidney disease: Secondary | ICD-10-CM | POA: Insufficient documentation

## 2014-10-25 LAB — IRON AND TIBC
Iron: 12 ug/dL — ABNORMAL LOW (ref 42–165)
Iron: 12 ug/dL — ABNORMAL LOW (ref 42–165)
Saturation Ratios: 7 % — ABNORMAL LOW (ref 20–55)
Saturation Ratios: 7 % — ABNORMAL LOW (ref 20–55)
TIBC: 161 ug/dL — ABNORMAL LOW (ref 215–435)
TIBC: 169 ug/dL — ABNORMAL LOW (ref 215–435)
UIBC: 149 ug/dL (ref 125–400)
UIBC: 157 ug/dL (ref 125–400)

## 2014-10-25 LAB — RENAL FUNCTION PANEL
Albumin: 2.7 g/dL — ABNORMAL LOW (ref 3.5–5.2)
Anion gap: 17 — ABNORMAL HIGH (ref 5–15)
BUN: 61 mg/dL — ABNORMAL HIGH (ref 6–23)
CO2: 22 mmol/L (ref 19–32)
Calcium: 8.7 mg/dL (ref 8.4–10.5)
Chloride: 101 mEq/L (ref 96–112)
Creatinine, Ser: 3.09 mg/dL — ABNORMAL HIGH (ref 0.50–1.35)
GFR calc Af Amer: 25 mL/min — ABNORMAL LOW (ref >90)
GFR calc non Af Amer: 22 mL/min — ABNORMAL LOW (ref >90)
Glucose, Bld: 189 mg/dL — ABNORMAL HIGH (ref 70–99)
Phosphorus: 4.1 mg/dL (ref 2.3–4.6)
Potassium: 4.1 mmol/L (ref 3.5–5.1)
Sodium: 140 mmol/L (ref 135–145)

## 2014-10-25 LAB — CBC
HCT: 30.3 % — ABNORMAL LOW (ref 39.0–52.0)
Hemoglobin: 9.2 g/dL — ABNORMAL LOW (ref 13.0–17.0)
MCH: 24.9 pg — ABNORMAL LOW (ref 26.0–34.0)
MCHC: 30.4 g/dL (ref 30.0–36.0)
MCV: 82.1 fL (ref 78.0–100.0)
Platelets: 293 10*3/uL (ref 150–400)
RBC: 3.69 MIL/uL — ABNORMAL LOW (ref 4.22–5.81)
RDW: 15.8 % — ABNORMAL HIGH (ref 11.5–15.5)
WBC: 7.5 10*3/uL (ref 4.0–10.5)

## 2014-10-25 LAB — GLUCOSE, CAPILLARY
Glucose-Capillary: 103 mg/dL — ABNORMAL HIGH (ref 70–99)
Glucose-Capillary: 170 mg/dL — ABNORMAL HIGH (ref 70–99)
Glucose-Capillary: 182 mg/dL — ABNORMAL HIGH (ref 70–99)
Glucose-Capillary: 202 mg/dL — ABNORMAL HIGH (ref 70–99)
Glucose-Capillary: 76 mg/dL (ref 70–99)
Glucose-Capillary: 78 mg/dL (ref 70–99)
Glucose-Capillary: 79 mg/dL (ref 70–99)

## 2014-10-25 LAB — SEDIMENTATION RATE: Sed Rate: 80 mm/hr — ABNORMAL HIGH (ref 0–16)

## 2014-10-25 LAB — RAPID URINE DRUG SCREEN, HOSP PERFORMED
Amphetamines: NOT DETECTED
Barbiturates: NOT DETECTED
Benzodiazepines: NOT DETECTED
Cocaine: NOT DETECTED
Opiates: POSITIVE — AB
Tetrahydrocannabinol: NOT DETECTED

## 2014-10-25 LAB — HEMOGLOBIN A1C
Hgb A1c MFr Bld: 5.9 % — ABNORMAL HIGH (ref <5.7)
Mean Plasma Glucose: 123 mg/dL — ABNORMAL HIGH (ref <117)

## 2014-10-25 LAB — VITAMIN B12: Vitamin B-12: 236 pg/mL (ref 211–911)

## 2014-10-25 LAB — FERRITIN: Ferritin: 356 ng/mL — ABNORMAL HIGH (ref 22–322)

## 2014-10-25 LAB — FOLATE: Folate: 11.9 ng/mL

## 2014-10-25 MED ORDER — INSULIN GLARGINE 100 UNIT/ML ~~LOC~~ SOLN
40.0000 [IU] | Freq: Every day | SUBCUTANEOUS | Status: DC
  Administered 2014-10-27 – 2014-10-30 (×4): 40 [IU] via SUBCUTANEOUS
  Filled 2014-10-25 (×5): qty 0.4

## 2014-10-25 NOTE — Consult Note (Signed)
Referring Provider: No ref. provider found Primary Care Physician:  [name], MD Primary Nephrologist:  Dr. Lorrene Reid  Reason for Consultation:   Medical management of renal transplant patient with acute on chronic renal failure HPI:  Baseline creatinine 2.1  S/p renal transplant 2009.  Has not had anything to eat or drink in 2 days. States he has been taking a lot of pain medication for his back pain recently. Baseline Creatinine 2.1. AKI likely due to dehydration vs post renal obstruction given recent urological procedure and mild hydronephroses noted on recent renal US . Bladder scan showed high residual  Past Medical History  Diagnosis Date  . Diabetes mellitus   . Hypertension   . Leg pain 02/05/11    with walking  . Weight gain   . Shortness of breath     when lying flat  . Diminished eyesight   . Hearing difficulty   . Chronic kidney disease     Dia;ysis MWF  . Anemia   . Nervousness(799.21)   . COPD (chronic obstructive pulmonary disease)   . CHF (congestive heart failure)   . Posttraumatic stress disorder   . Allergic rhinitis   . Dyslipidemia   . Morbid obesity   . Diabetic peripheral neuropathy   . Gait disorder   . Obstructive sleep apnea   . Bilateral carpal tunnel syndrome   . Anxiety disorder   . Gastroparesis   . Gout   . Diabetic retinopathy   . Renal transplant, status post 04/05/2014    Past Surgical History  Procedure Laterality Date  . Kidney transplant  2009  . Cataract extraction w/ intraocular lens  implant, bilateral Bilateral   . Av fistula placement Bilateral   . Arteriovenous graft placement Bilateral "several"    Prior to Admission medications   Medication Sig Start Date End Date Taking? Authorizing Provider  allopurinol (ZYLOPRIM) 100 MG tablet Take 100 mg by mouth daily. Take with the 300mg  tablet to make a total of 400mg  daily per patient   Yes Historical Provider, MD  allopurinol (ZYLOPRIM) 300 MG tablet Take 300 mg by mouth daily.  Take with the 100mg  tablet to make a total dose of 400mg  a day per patient   Yes Historical Provider, MD  aspirin 325 MG tablet Take 325 mg by mouth daily.    Yes Historical Provider, MD  calcitRIOL (ROCALTROL) 0.5 MCG capsule Take 0.5 mcg by mouth at bedtime.    Yes Historical Provider, MD  clonazePAM (KLONOPIN) 0.5 MG tablet Take 0.5 mg by mouth 3 (three) times daily as needed for anxiety.    Yes Historical Provider, MD  cloNIDine (CATAPRES) 0.1 MG tablet Take 0.1 mg by mouth daily as needed. Take only if blood pressure is high   Yes Historical Provider, MD  cyclobenzaprine (FLEXERIL) 10 MG tablet Take 1 tablet (10 mg total) by mouth 2 (two) times daily as needed for muscle spasms. 10/21/14  Yes Charlesetta Shanks, MD  furosemide (LASIX) 40 MG tablet Take 80 mg by mouth 2 (two) times daily.    Yes Historical Provider, MD  HYDROcodone-acetaminophen (NORCO/VICODIN) 5-325 MG per tablet Take 1-2 tablets by mouth every 6 (six) hours as needed for moderate pain or severe pain. 10/21/14  Yes Charlesetta Shanks, MD  insulin aspart (NOVOLOG) 100 UNIT/ML injection Inject 50 Units into the skin 5 (five) times daily.   Yes Historical Provider, MD  insulin glargine (LANTUS) 100 UNIT/ML injection Inject 90 Units into the skin 3 (three) times daily.  Yes Historical Provider, MD  mycophenolate (CELLCEPT) 250 MG capsule Take 750 mg by mouth 2 (two) times daily.    Yes Historical Provider, MD  omeprazole (PRILOSEC) 20 MG capsule Take 20 mg by mouth daily.   Yes Historical Provider, MD  potassium chloride (MICRO-K) 10 MEQ CR capsule Take 20 mEq by mouth 2 (two) times daily.    Yes Historical Provider, MD  predniSONE (DELTASONE) 5 MG tablet Take 5 mg by mouth daily.     Yes Historical Provider, MD  pregabalin (LYRICA) 50 MG capsule Take 1 capsule (50 mg total) by mouth at bedtime. 10/16/14  Yes Kathrynn Ducking, MD  rosuvastatin (CRESTOR) 20 MG tablet Take 20 mg by mouth at bedtime.    Yes Historical Provider, MD  sildenafil  (VIAGRA) 100 MG tablet Take 50 mg by mouth daily as needed for erectile dysfunction.   Yes Historical Provider, MD  tacrolimus (PROGRAF) 0.5 MG capsule Take 1 mg by mouth 2 (two) times daily.    Yes Historical Provider, MD  CIALIS 5 MG tablet Take 5 mg by mouth daily as needed for erectile dysfunction.  06/05/14   Historical Provider, MD  HYDROcodone-acetaminophen (NORCO/VICODIN) 5-325 MG per tablet Take 1-2 tablets by mouth 2 (two) times daily. Patient not taking: Reported on 10/24/2014 02/07/14   Kathrynn Ducking, MD  insulin lispro (HUMALOG) 100 UNIT/ML injection Inject 50 Units into the skin. Take 50 units five times / day (with meals and snacks)    Historical Provider, MD  metolazone (ZAROXOLYN) 5 MG tablet Take 5 mg by mouth every Monday, Wednesday, and Friday.     Historical Provider, MD  sildenafil (VIAGRA) 100 MG tablet Take 0.5 tablets (50 mg total) by mouth daily as needed for erectile dysfunction. Patient not taking: Reported on 10/24/2014 07/20/11 08/19/11  Burnell Blanks, MD    Current Facility-Administered Medications  Medication Dose Route Frequency Provider Last Rate Last Dose  . 0.9 %  sodium chloride infusion  250 mL Intravenous PRN [place] N [place], DO      . acetaminophen (TYLENOL) tablet 650 mg  650 mg Oral Q6H PRN Lorna Few, DO       Or  . acetaminophen (TYLENOL) suppository 650 mg  650 mg Rectal Q6H PRN Lorna Few, DO      . aspirin tablet 325 mg  325 mg Oral Daily [place] N Rum[place], DO   325 mg at 10/25/14 1044  . clonazePAM (KLONOPIN) tablet 0.5 mg  0.5 mg Oral TID PRN Lorna Few, DO      . feeding supplement (ENSURE COMPLETE) (ENSURE COMPLETE) liquid 237 mL  237 mL Oral BID BM Zigmund Gottron, MD      . hydrALAZINE (APRESOLINE) injection 10 mg  10 mg Intravenous Q6H PRN [employer], DO      . HYDROcodone-acetaminophen (NORCO/VICODIN) 5-325 MG per tablet 2 tablet  2 tablet Oral Q6H PRN Katheren Shams, DO   2 tablet at 10/25/14 0827  .  insulin aspart (novoLOG) injection 0-9 Units  0-9 Units Subcutaneous TID WC Lorna Few, DO   2 Units at 10/25/14 0827  . insulin aspart (novoLOG) injection 30 Units  30 Units Subcutaneous TID WC [facility], DO   30 Units at 10/25/14 0827  . insulin glargine (LANTUS) injection 60 Units  60 Units Subcutaneous TID [facility], DO   60 Units at 10/25/14 1044  . LORazepam (ATIVAN) injection 1 mg  1 mg Intravenous Q4H  PRN [facility], DO      . morphine 2 MG/ML injection 1 mg  1 mg Intravenous Q3H PRN Katheren Shams, DO   1 mg at 10/25/14 0827  . mycophenolate (CELLCEPT) capsule 750 mg  750 mg Oral BID [place] N Rumley, DO   750 mg at 10/25/14 1044  . ondansetron (ZOFRAN) tablet 4 mg  4 mg Oral Q6H PRN [place] N Rumley, DO       Or  . ondansetron (ZOFRAN) injection 4 mg  4 mg Intravenous Q6H PRN [place] N Rum[place], DO      . pantoprazole (PROTONIX) EC tablet 40 mg  40 mg Oral Daily [place] N Rumley, DO   40 mg at 10/25/14 1044  . predniSONE (DELTASONE) tablet 5 mg  5 mg Oral Daily [place] N Rumley, DO   5 mg at 10/25/14 1044  . rosuvastatin (CRESTOR) tablet 20 mg  20 mg Oral QHS [place] N Rum[place], DO   20 mg at 10/24/14 2108  . sodium chloride 0.9 % injection 3 mL  3 mL Intravenous Q12H [place] N Rumley, DO   3 mL at 10/25/14 1046  . sodium chloride 0.9 % injection 3 mL  3 mL Intravenous PRN [place] N Rumley, DO      . tacrolimus (PROGRAF) capsule 1 mg  1 mg Oral BID [facility], DO   1 mg at 10/25/14 1045    Allergies as of 10/24/2014 - Review Complete 10/24/2014  Allergen Reaction Noted  . Penicillins Nausea Only and Other (See Comments)   . Pork-derived products Anaphylaxis and Other (See Comments) 10/21/2014  . Ace inhibitors Cough     Family History  Problem Relation Age of Onset  . Hypertension Mother   . Diabetes Father     History   Social History  . Marital Status: Divorced    Spouse Name: N/A    Number of Children: 0  . Years of Education: College     Occupational History  .     Social History Main Topics  . Smoking status: Never Smoker   . Smokeless tobacco: Never Used  . Alcohol Use: No  . Drug Use: No  . Sexual Activity: Not on file   Other Topics Concern  . Not on file   Social History Narrative   Patient is separated.   Patient does not have any children.   Patient is right-handed   Patient is currently in college working on his Ph.D.          Review of Systems: Gen: Denies any fever, chills, sweats, anorexia, fatigue, weakness, malaise, weight loss, and sleep disorder HEENT: No visual complaints, No history of Retinopathy. Normal external appearance No Epistaxis or Sore throat. No sinusitis.   CV: Denies chest pain, angina, palpitations, syncope, orthopnea, PND, peripheral edema, and claudication. Resp: Denies dyspnea at rest, dyspnea with exercise, cough, sputum, wheezing, coughing up blood, and pleurisy. GI: Denies vomiting blood, jaundice, and fecal incontinence.   Denies dysphagia or odynophagia. GU : Denies urinary burning, blood in urine, urinary frequency, urinary hesitancy, nocturnal urination, and urinary incontinence.  No renal calculi. MS: Denies joint pain, limitation of movement, and swelling, stiffness, low back pain, extremity pain. Denies muscle weakness, cramps, atrophy.  No use of non steroidal antiinflammatory drugs. Derm: Denies rash, itching, dry skin, hives, moles, warts, or unhealing ulcers.  Psych: Denies depression, anxiety, memory loss, suicidal ideation, hallucinations, paranoia, and confusion. Heme: Denies bruising, bleeding, and enlarged lymph nodes. Neuro: No  headache.  No diplopia. No dysarthria.  No dysphasia.  No history of CVA.  No Seizures. No paresthesias.  No weakness. Endocrine No DM.  No Thyroid disease.  No Adrenal disease.  Physical Exam: Vital signs in last 24 hours: Temp:  [98.7 F (37.1 C)-99 F (37.2 C)] 99 F (37.2 C) (01/14 1034) Pulse Rate:  [81-91] 83 (01/14  1034) Resp:  [18-20] 18 (01/14 1034) BP: (115-156)/(45-68) 115/49 mmHg (01/14 1034) SpO2:  [86 %-99 %] 94 % (01/14 1034) Weight:  [119.1 kg (262 lb 9.1 oz)] 119.1 kg (262 lb 9.1 oz) (01/13 2057) Last BM Date: 10/24/14 General:   Alert,  Well-developed, well-nourished, pleasant and cooperative in NAD Head:  Normocephalic and atraumatic. Eyes:  Sclera clear, no icterus.   Conjunctiva pink. Ears:  Normal auditory acuity. Nose:  No deformity, discharge,  or lesions. Mouth:  No deformity or lesions, dentition normal. Neck:  Supple; no masses or thyromegaly. JVP not elevated Lungs:  Clear throughout to auscultation.   No wheezes, crackles, or rhonchi. No acute distress. Heart:  Regular rate and rhythm; no murmurs, clicks, rubs,  or gallops. Abdomen:  Soft, non mild tender and suprapubic fullness Msk:  Symmetrical without gross deformities. Normal posture. Pulses:  No carotid, renal, femoral bruits. DP and PT symmetrical and equal Extremities:  Without clubbing or edema. Neurologic:  Alert and  oriented x4;  grossly normal neurologically. Skin:  Intact without significant lesions or rashes. Cervical Nodes:  No significant cervical adenopathy. Psych:  Alert and cooperative. Normal mood and affect.  Intake/Output from previous day: 01/13 0701 - 01/14 0700 In: 120 [P.O.:120] Out: 700 [Urine:700] Intake/Output this shift: Total I/O In: 120 [P.O.:120] Out: -   Lab Results:  Recent Labs  10/24/14 1045 10/25/14 0546  WBC 9.2 7.5  HGB 9.3* 9.2*  HCT 30.8* 30.3*  PLT 304 293   BMET  Recent Labs  10/24/14 1045 10/25/14 0430  NA 131* 140  K 4.2 4.1  CL 98 101  CO2 22 22  GLUCOSE 153* 189*  BUN 61* 61*  CREATININE 3.18* 3.09*  CALCIUM 8.3* 8.7  PHOS  --  4.1   LFT  Recent Labs  10/24/14 1045 10/25/14 0430  PROT 6.3  --   ALBUMIN 2.8* 2.7*  AST 15  --   ALT 12  --   ALKPHOS 88  --   BILITOT 1.1  --    PT/INR No results for input(s): LABPROT, INR in the last 72  hours. Hepatitis Panel No results for input(s): HEPBSAG, HCVAB, HEPAIGM, HEPBIGM in the last 72 hours.  Studies/Results: Mr Lumbar Spine Wo Contrast  10/24/2014   CLINICAL DATA:  Low back pain rated 10/10. Severe burning pain in the right lateral aspect of his neck.  EXAM: MRI LUMBAR SPINE WITHOUT CONTRAST  TECHNIQUE: Multiplanar, multisequence MR imaging of the lumbar spine was performed. No intravenous contrast was administered.  COMPARISON:  CT of the abdomen and pelvis 12/30/2009  FINDINGS: Normal signal is present in the conus medullaris which terminates at L1, within normal limits. T1 marrow signal is diffusely depressed. Template edematous endplate changes are present at L5-S1. No other focal signal changes are present. Vertebral body heights and alignment are normal.  Limited imaging of the abdomen is unremarkable.  The disc levels at L3-4 and above scratch the disc levels at L2-3 and above are normal.  L3-4: Mild leftward disc bulging is present without significant stenosis.  L4-5: A leftward disc protrusion is present. There is an annular  tear. Mild subarticular narrowing is worse left than right. Mild foraminal narrowing is present bilaterally as well.  L5-S1: A leftward disc protrusion is present. Prominent epidural fat is evident. The disc protrudes anteriorly as well. The combination results in mild left subarticular narrowing. The foramina are patent.  IMPRESSION: 1. Leftward disc protrusion and prominent epidural fat at L5-S1 with mild left subarticular stenosis. 2. Leftward disc protrusion and annular tear at L4-5 with mild subarticular stenosis bilaterally, left greater than right. 3. Mild foraminal narrowing bilaterally at L4-5 as well.   Electronically Signed   By: Lawrence Santiago M.D.   On: 10/24/2014 16:11   US Renal  10/25/2014   CLINICAL DATA:  Evaluate apparent lesion arising from the native right kidney. Personal history of renal transplant. Initial encounter.  EXAM: RENAL/URINARY  TRACT ULTRASOUND COMPLETE  COMPARISON:  CT of the abdomen and pelvis performed 12/30/2009  FINDINGS: Right Kidney:  Length: 10.7 cm. Diffusely increased parenchymal echogenicity noted. Marked cortical thinning is seen. A 4.2 x 3.7 x 3.3 cm cyst is noted arising at the interpole region of the right kidney, simple in appearance. No hydronephrosis visualized.  Left Kidney:  Length: 9.1 cm. Diffusely increased parenchymal echogenicity noted. Marked cortical thinning is seen. No mass or hydronephrosis visualized.  Bladder:  Significantly distended and grossly unremarkable in appearance.  IMPRESSION: 1. 4.2 cm simple cyst at the interpole region of the right kidney. No suspicious masses seen. 2. Diffusely increased parenchymal echogenicity and marked bilateral native renal cortical thinning, compatible with the patient's known chronic native renal failure. The transplant kidney at the right iliac fossa is not assessed on this study. Per discussion with the clinical team, the only clinical question is in regard to the native right renal cyst, and thus assessment of the transplant kidney is not currently requested. 3. The bladder is significantly distended but otherwise grossly unremarkable. The patient does not describe any urinary urgency at this time; would consider clinical correlation for urinary retention.   Electronically Signed   By: Garald Balding M.D.   On: 10/25/2014 07:19    Assessment/Plan:  Acute kidney injury renal transplant with bladder outlet obstruction would recommend foley and voiding trials with flomax.   HTN controlled  Immunosuppression continue home dose  Anemia check iron stores   LOS: 1 [place] W @TODAY @11 :19 AM

## 2014-10-25 NOTE — Progress Notes (Signed)
Inpatient Diabetes Program Recommendations  AACE/ADA: New Consensus Statement on Inpatient Glycemic Control (2013)  Target Ranges:  Prepandial:   less than 140 mg/dL      Peak postprandial:   less than 180 mg/dL (1-2 hours)      Critically ill patients:  140 - 180 mg/dL   Reason for assessment:  Results for NATHIAN, CALAHAN (MRN [id number]) as of 10/25/2014 16:01  Ref. Range 10/25/2014 00:07 10/25/2014 04:14 10/25/2014 08:00 10/25/2014 11:45  Glucose-Capillary Latest Range: 70-99 mg/dL 202 (H) 182 (H) 170 (H) 79    Diabetes history: Type 2 diabetes Outpatient Diabetes medications: According to Med. Rec, patient takes Novolog 50 units 5 times daily and Lantus 90 units tid with meals Current orders for Inpatient glycemic control:  Novolog 30 units tid with meals, Lantus 60 units tid with meals, Novolog sensitive tid with meals  May consider decreasing Novolog meal coverage to 20 units tid with meals.  Spoke with patient regarding home insulin regimen.  He states that he takes Novolog 5 times a day, based on a scale.  He states that CBG's mostly run in the 70-80's.  No other needs noted at this time.   Thanks, Adah Perl, RN, BC-ADM Inpatient Diabetes Coordinator Pager [phone]

## 2014-10-25 NOTE — Progress Notes (Signed)
UR Completed.  [phone]  

## 2014-10-25 NOTE — Evaluation (Signed)
Physical Therapy Evaluation Patient Details Name: [name] [name] MRN: [id number] DOB: September 20, 1966 Today's Date: 10/25/2014   History of Present Illness  [name] [name] is a 49 y.o. male presenting with back and neck pain, noted to have AKI with Creatinine of 3.18. PMH is significant for CKD s/p renal transplant, HTN, DM type 2, COPD, anxiety, and hyperlipidemia.  Clinical Impression  Pt admitted with above diagnosis. Pt currently with functional limitations due to the deficits listed below (see PT Problem List). At the time of PT eval pt was extremely limited in functional mobility due to back pain. Pt willing to attempt minimal bed mobility, and required constant cueing for sequencing/technique (instructed in log roll). While sitting EOB pt yelling out in pain the entire time (~2 minutes) before sit>supine was initiated. Pt will benefit from skilled PT to increase their independence and safety with mobility to allow discharge to the venue listed below.       Follow Up Recommendations SNF;Supervision/Assistance - 24 hour    Equipment Recommendations  Rolling walker with 5" wheels    Recommendations for Other Services       Precautions / Restrictions Precautions Precautions: Fall;Back Precaution Booklet Issued: No Precaution Comments: Discussed benefits of log roll technique to minimize stress on back, however pt did not appear receptive.  Restrictions Weight Bearing Restrictions: No      Mobility  Bed Mobility Overal bed mobility: Needs Assistance;+2 for physical assistance Bed Mobility: Rolling;Sidelying to Sit;Sit to Sidelying Rolling: Max assist;Total assist;+2 for physical assistance Sidelying to sit: Total assist;+2 for physical assistance     Sit to sidelying: Max assist;+2 for physical assistance General bed mobility comments: Pt resisting movement, and bed pad used to facilitate roll. Pt extremely anxious and yelling out in pain with the slightest movement. Total assist  for LE elevation back into bed and to position pt straight on the mattress.   Transfers                 General transfer comment: Unable to attempt at this time due to pain.   Ambulation/Gait                Stairs            Wheelchair Mobility    Modified Rankin (Stroke Patients Only)       Balance Overall balance assessment: Needs assistance Sitting-balance support: Feet supported;Bilateral upper extremity supported Sitting balance-Leahy Scale: Fair         Standing balance comment: Unable ot attempt secondary to pain                             Pertinent Vitals/Pain Pain Assessment: Faces Pain Score: 10-Worst pain ever Faces Pain Scale: Hurts worst Pain Location: low back Pain Descriptors / Indicators: Aching Pain Intervention(s): Limited activity within patient's tolerance;Monitored during session;Repositioned;Heat applied    Home Living Family/patient expects to be discharged to:: Private residence Living Arrangements: Alone Available Help at Discharge: Personal care attendant;Available PRN/intermittently Type of Home: Apartment Home Access: Stairs to enter Entrance Stairs-Rails: Right Entrance Stairs-Number of Steps: 10  Home Layout: One level Home Equipment: Wheelchair - power      Prior Function Level of Independence: Needs assistance   Gait / Transfers Assistance Needed: Has power chair at home, unclear how much pt was ambulating prior to this admission. Pt states that he was able to negotiate the steps with some assist.   ADL's / Homemaking Assistance Needed:  Pt states that he has home health aid 2 1/5 hours per day - for Cooking/meal prep, sponge bathe, change clothes etc...        Hand Dominance   Dominant Hand: Right    Extremity/Trunk Assessment   Upper Extremity Assessment: Generalized weakness           Lower Extremity Assessment: Generalized weakness;Defer to PT evaluation      Cervical / Trunk  Assessment: Normal  Communication   Communication: No difficulties  Cognition Arousal/Alertness: Awake/alert Behavior During Therapy: Flat affect;Anxious Overall Cognitive Status: Within Functional Limits for tasks assessed                      General Comments      Exercises        Assessment/Plan    PT Assessment Patient needs continued PT services  PT Diagnosis Difficulty walking;Acute pain   PT Problem List Decreased strength;Decreased range of motion;Decreased activity tolerance;Decreased balance;Decreased mobility;Decreased knowledge of use of DME;Decreased safety awareness;Decreased knowledge of precautions;Pain  PT Treatment Interventions DME instruction;Gait training;Stair training;Functional mobility training;Therapeutic activities;Therapeutic exercise;Neuromuscular re-education;Patient/family education   PT Goals (Current goals can be found in the Care Plan section) Acute Rehab PT Goals Patient Stated Goal: Decrease back pain PT Goal Formulation: With patient Time For Goal Achievement: 11/08/14 Potential to Achieve Goals: Fair    Frequency Min 2X/week   Barriers to discharge Decreased caregiver support Pt does not have 24 hour assist at home    Co-evaluation               End of Session   Activity Tolerance: Patient limited by pain Patient left: in bed;with call bell/phone within reach;with bed alarm set Nurse Communication: Mobility status         Time: [id number] PT Time Calculation (min) (ACUTE ONLY): 27 min   Charges:   PT Evaluation $Initial PT Evaluation Tier I: 1 Procedure PT Treatments $Therapeutic Activity: 8-22 mins   PT G Codes:        Rolinda Roan 10/29/2014, 1:37 PM   Rolinda Roan, PT, DPT Acute Rehabilitation Services Pager: [phone]

## 2014-10-25 NOTE — Evaluation (Signed)
Occupational Therapy Evaluation Patient Details Name: Royle Sabath MRN: [id number] DOB: 1965-10-19 Today's Date: 10/25/2014    History of Present Illness Aamir Mcgrue is a 49 y.o. male presenting with back and neck pain, noted to have AKI with Creatinine of 3.18. PMH is significant for CKD s/p renal transplant, HTN, DM type 2, COPD, anxiety, and hyperlipidemia.   Clinical Impression   Pt is a 49 y/o male admitted as above whom is extremely limited in ADL/functional activity secondary to low back pain. He is +2 total physical assist given constant multimodal cues for log roll in bed and was only able to tolerate sitting EOB x~1.5-22min while yelling out in pain. Pt may benefit from acute OT to address problems as listed below.    Follow Up Recommendations  SNF;Supervision/Assistance - 24 hour    Equipment Recommendations  Other (comment) (Defer to next venue)    Recommendations for Other Services       Precautions / Restrictions Precautions Precautions: Fall;Back Precaution Booklet Issued: No Precaution Comments: Discussed benefits of log roll technique to minimize stress on back, however pt did not appear receptive.  Restrictions Weight Bearing Restrictions: No      Mobility Bed Mobility Overal bed mobility: Needs Assistance;+2 for physical assistance Bed Mobility: Rolling;Sidelying to Sit;Sit to Sidelying Rolling: Max assist;Total assist;+2 for physical assistance Sidelying to sit: Total assist;+2 for physical assistance     Sit to sidelying: Max assist;+2 for physical assistance General bed mobility comments: Pt resisting movement, and bed pad used to facilitate roll. Pt extremely anxious and yelling out in pain with the slightest movement. Total assist for LE elevation back into bed and to position pt straight on the mattress.   Transfers                 General transfer comment: Unable to attempt at this time due to pain.     Balance Overall balance  assessment: Needs assistance Sitting-balance support: Feet supported;Bilateral upper extremity supported Sitting balance-Leahy Scale: Fair         Standing balance comment: Unable ot attempt secondary to pain                            ADL Overall ADL's : Needs assistance/impaired Eating/Feeding: Independent;Bed level   Grooming: Wash/dry hands;Bed level;Set up   Upper Body Bathing: Set up;Total assistance;Bed level   Lower Body Bathing: Total assistance;+2 for physical assistance;Bed level   Upper Body Dressing : Total assistance;Bed level;Cueing for sequencing   Lower Body Dressing: +2 for physical assistance;Total assistance;Bed level;Cueing for back precautions Lower Body Dressing Details (indicate cue type and reason): Consistent Multimodal cues throughout session to attempt to don underwear per pt request. Pt initially thought he could don himself, then required +2 physical/total assist to don just above knees at which point, pt reported pain was too much in low back with underwear on his legs.  Toilet Transfer: Total assistance;+2 for physical assistance Toilet Transfer Details (indicate cue type and reason): Pt is declining and refusing any OOB activity and has only used urinal to this point, pt states that if he "had to get to" 3:1 "he could".  Toileting- Clothing Manipulation and Hygiene: Total assistance;+2 for physical assistance;Bed level   Tub/ Shower Transfer:  (NT)   Functional mobility during ADLs: +2 for physical assistance;Total assistance (+2 total assist for bed mobility and log rolling; pt required increased amount of time, constant multimodal cues for participation w/ very  little out-put due to c/o pain. ) General ADL Comments: Pt is currently unable to participate meaningfully in ADL's and functional mobility/transfers due to c/o pain in low back. Pt was +2 total physical assist for log rolling in bed and sitting EOB for ~1.68min-2min given constant  multimodal cues and assistance. Pt was educated in role of OT and was agreeable to oob activities, but then stated he could perform by himeself. He was agreeable to sitting EOB in preparation for transferring to chair and then declined appearing confused about plans to transfer to chair and/or sit up so he could take medication (RN had requested that pt sit up EOB for meds today). Will attempt OT for ADL retrainign and functional mobiilty and transfers as pt is able to tolerate.     Vision  NT                   Perception     Praxis      Pertinent Vitals/Pain Pain Assessment: Faces Pain Score: 10-Worst pain ever Faces Pain Scale: Hurts worst Pain Location: low back Pain Descriptors / Indicators: Aching Pain Intervention(s): Limited activity within patient's tolerance;Monitored during session;Repositioned;Heat applied     Hand Dominance Right   Extremity/Trunk Assessment Upper Extremity Assessment Upper Extremity Assessment: Generalized weakness   Lower Extremity Assessment Lower Extremity Assessment: Generalized weakness;Defer to PT evaluation   Cervical / Trunk Assessment Cervical / Trunk Assessment: Normal   Communication Communication Communication: No difficulties   Cognition Arousal/Alertness: Awake/alert Behavior During Therapy: Flat affect;Anxious Overall Cognitive Status: Within Functional Limits for tasks assessed                     General Comments       Exercises       Shoulder Instructions      Home Living Family/patient expects to be discharged to:: Private residence Living Arrangements: Alone Available Help at Discharge: Personal care attendant;Available PRN/intermittently Type of Home: Apartment Home Access: Stairs to enter Entrance Stairs-Number of Steps: 10  Entrance Stairs-Rails: Right Home Layout: One level     Bathroom Shower/Tub: Tub/shower unit;None   Biochemist, clinical: Standard Bathroom Accessibility:  (pt doesn't  know)   Home Equipment: Wheelchair - power          Prior Functioning/Environment Level of Independence: Needs assistance  Gait / Transfers Assistance Needed: Has power chair at home, unclear how much pt was ambulating prior to this admission. Pt states that he was able to negotiate the steps with some assist.  ADL's / Homemaking Assistance Needed: Pt states that he has home health aid 2 1/5 hours per day - for Cooking/meal prep, sponge bathe, change clothes etc... Communication / Swallowing Assistance Needed: No difficulty      OT Diagnosis: Acute pain;Generalized weakness   OT Problem List: Decreased strength;Decreased activity tolerance;Impaired balance (sitting and/or standing);Decreased knowledge of precautions;Decreased knowledge of use of DME or AE;Pain;Obesity   OT Treatment/Interventions: Self-care/ADL training;Therapeutic exercise;DME and/or AE instruction;Patient/family education;Therapeutic activities;Balance training    OT Goals(Current goals can be found in the care plan section) Acute Rehab OT Goals Patient Stated Goal: Decrease back pain Time For Goal Achievement: 11/08/14 Potential to Achieve Goals: Fair  OT Frequency: Min 2X/week   Barriers to D/C:            Co-evaluation              End of Session Nurse Communication: Mobility status;Patient requests pain meds  Activity Tolerance: Patient limited by pain;Treatment  limited secondary to agitation Patient left: in bed;with call bell/phone within reach;with bed alarm set   Time: 1020-1109 OT Time Calculation (min): 49 min Charges:  OT General Charges $OT Visit: 1 Procedure OT Evaluation $Initial OT Evaluation Tier I: 1 Procedure OT Treatments $Therapeutic Activity: 23-37 mins (25 min) G-Codes:    Josephine Igo Dixon, OTR/L 10/25/2014, 1:07 PM

## 2014-10-25 NOTE — Progress Notes (Signed)
Family Medicine Teaching Service Daily Progress Note Intern Pager: [phone]  Patient name: [name] [name] Medical record number: [id number] Date of birth: Dec 13, 1965 Age: 49 y.o. Gender: male  Primary Care Provider: [name], MD Consultants: Nephro Code Status: Full  Pt Overview and Major Events to Date:  1/14: Admitted for AKI in setting of hx renal transplant  Assessment and Plan: Avedis Callo is a 49 y.o. male presenting with back and neck pain, noted to have AKI with Creatinine of 3.18. PMH is significant for CKD s/p renal transplant, HTN, DM type 2, COPD, anxiety, and hyperlipidemia.  # AKI with h/o CKD s/p renal transplant: History of renal transplant in 2009. Renal failure caused by diabetic nephropathy. States last visited nephrologist in December 2015. Has not missed dose of transplant medications, which includes Cellcept, Prednisone, and Prograf. Has not had anything to eat or drink in 2 days. States he has been taking a lot of pain medication for his back pain recently. Baseline Creatinine 2.1. AKI likely due to dehydration vs post renal obstruction given recent urological procedure and mild hydronephroses noted on recent renal US (transplanted kidney in RLQ) Urinalysis few bacteria, small leukocytes, hyaline casts - Creatinine 3.18 at admission. BUN 61. GFR 25. - CK nml - UDS + for opiates - Follow up Urine Culture -Renal US with minimal hydronephrosis of renal transplant. -s/p 1L bolus of NS - Avoid nephrotoxic medications - Continue Cellcept, Prednisone, and Prograf - Holding Lasix 80mg  BID - renal consulted; appreciate recs - Repeat renal panel pending  # Anemia: Hemoglobin 9.3 on admission. No evidence of bleeding. Prior to admission Hbgs 12-13. This could indicate worsening renal function.  - FOBT pending - Follow up anemia panel - Hbg this morning 9.2 - stable - Continue to monitor and trend Hbg  #Urinary retention: Patient with recent urologic  procedure. Possible due to some kind of obstruction. Patient states he voids better after procedure. Urologic procedure performed at [facility] to monitor -will get post-void residual bladder scan -low threshold for consulting urology for possible obstruction -mild hydronephrosis noted on renal US  # Muscle Spasm: - MRI lumbar spine- disc protrusions L4-L5 and L5-S1 with stenosis - Holding home muscle relaxers due to AKI - Morphine 1mg  q3hrs PRN sever pain, Norco prn for moderate pain, Acetaminophen prn for mild pain - Ativan Lorazepam 1mg  q4hr PRN muscle spasm -PT/OT consulted -in setting of urinary rentention, back pain, and neuropathy will perform rectal evaluation for sphincter tone  # HTN: BP 152/63 today. Home medication includes Clonodine 0.1mg  PRN elevated BP. BPs stable. - Clonidine held - Hydralazine PRN SBP >180 or DBP >110 - Continue to monitor  # DM: Glucose 153 in ED. Last A1C 8.7 in 2011. Home medications include Lantus 90U TID and Novolog 50U five times a day. -A1c and iron studies pending -reticulocyte normal - patient not responding to anemia appropriately - Discussed diabetes management with pharmacy.  - Sensitive Insulin Sliding Scale - Novolog 30Units TID with meals - Lantus 60U TID - CBGs q4hr for first day as insulin regimen is adjusted - CBGs overnight <250  # Anxiety: Home medication includes Klonopin 0.5mg  TID PRN anxiety - Also giving Ativan for muscle spasm - Continue home medication  FEN/GI: Heart Healthy/Carb Modified Diet Prophylaxis: SCDs (Allergic to Pork Derived Products with Anaphylaxis)   Disposition: Continue current management; pending further work-up.  Subjective:  Patient endorsing back pain this morning. He is unable to sit up to eat or drink. Of note, last night  patient was noted to have urinary retention of 800cc. He was able to void once her got up out of bed. Patient believes his renal  function is due to his lack of eating/drinking in the last few day as this has been a normal occurrence of his Cr when he gets dehydrated.  Objective: Temp:  [98.7 F (37.1 C)-98.8 F (37.1 C)] 98.7 F (37.1 C) (01/14 0539) Pulse Rate:  [81-96] 81 (01/14 0539) Resp:  [17-20] 18 (01/14 0539) BP: (124-156)/(45-68) 128/45 mmHg (01/14 0539) SpO2:  [86 %-99 %] 96 % (01/14 0539) Weight:  [261 lb (118.389 kg)-262 lb 9.1 oz (119.1 kg)] 262 lb 9.1 oz (119.1 kg) (01/13 2057) Physical Exam: General: 49yo male in mild distress secondary to pain HEENT: PERRLA. Dry mucous membranes. Cardiovascular: S1 and S2 noted. No murmurs/rubs/gallops. Regular rate and rhythm. Respiratory: Clear to auscultation bilaterally. No wheezes/rales/rhonci. Abdomen: Bowel sounds noted. Soft and nondistended. No tenderness or masses with palpation. No CVA tenderness noted. Muskuloskeletal: No edema noted. Positive straight leg raises bilaterally. Pain with active movement of legs. No decreased sensation noted.  Skin: No rashes noted. Warm. Neuro: No focal deficits.   Laboratory: Results for orders placed or performed during the hospital encounter of 10/24/14 (from the past 24 hour(s))  CBC     Status: Abnormal   Collection Time: 10/24/14 10:45 AM  Result Value Ref Range   WBC 9.2 4.0 - 10.5 K/uL   RBC 3.84 (L) 4.22 - 5.81 MIL/uL   Hemoglobin 9.3 (L) 13.0 - 17.0 g/dL   HCT 30.8 (L) 39.0 - 52.0 %   MCV 80.2 78.0 - 100.0 fL   MCH 24.2 (L) 26.0 - 34.0 pg   MCHC 30.2 30.0 - 36.0 g/dL   RDW 15.7 (H) 11.5 - 15.5 %   Platelets 304 150 - 400 K/uL  Comprehensive metabolic panel     Status: Abnormal   Collection Time: 10/24/14 10:45 AM  Result Value Ref Range   Sodium 131 (L) 135 - 145 mmol/L   Potassium 4.2 3.5 - 5.1 mmol/L   Chloride 98 96 - 112 mEq/L   CO2 22 19 - 32 mmol/L   Glucose, Bld 153 (H) 70 - 99 mg/dL   BUN 61 (H) 6 - 23 mg/dL   Creatinine, Ser 3.18 (H) 0.50 - 1.35 mg/dL   Calcium 8.3 (L) 8.4 - 10.5 mg/dL    Total Protein 6.3 6.0 - 8.3 g/dL   Albumin 2.8 (L) 3.5 - 5.2 g/dL   AST 15 0 - 37 U/L   ALT 12 0 - 53 U/L   Alkaline Phosphatase 88 39 - 117 U/L   Total Bilirubin 1.1 0.3 - 1.2 mg/dL   GFR calc non Af Amer 21 (L) >90 mL/min   GFR calc Af Amer 25 (L) >90 mL/min   Anion gap 11 5 - 15  Lipase, blood     Status: None   Collection Time: 10/24/14 10:45 AM  Result Value Ref Range   Lipase 31 11 - 59 U/L  Urinalysis, Routine w reflex microscopic     Status: Abnormal   Collection Time: 10/24/14 12:23 PM  Result Value Ref Range   Color, Urine YELLOW YELLOW   APPearance HAZY (A) CLEAR   Specific Gravity, Urine 1.013 1.005 - 1.030   pH 5.0 5.0 - 8.0   Glucose, UA NEGATIVE NEGATIVE mg/dL   Hgb urine dipstick NEGATIVE NEGATIVE   Bilirubin Urine NEGATIVE NEGATIVE   Ketones, ur NEGATIVE NEGATIVE mg/dL   Protein,  ur NEGATIVE NEGATIVE mg/dL   Urobilinogen, UA 0.2 0.0 - 1.0 mg/dL   Nitrite NEGATIVE NEGATIVE   Leukocytes, UA SMALL (A) NEGATIVE  Urine microscopic-add on     Status: Abnormal   Collection Time: 10/24/14 12:23 PM  Result Value Ref Range   Squamous Epithelial / LPF FEW (A) RARE   WBC, UA 7-10 <3 WBC/hpf   Bacteria, UA FEW (A) RARE   Casts HYALINE CASTS (A) NEGATIVE  Glucose, capillary     Status: Abnormal   Collection Time: 10/24/14  9:11 PM  Result Value Ref Range   Glucose-Capillary 163 (H) 70 - 99 mg/dL  Reticulocytes     Status: Abnormal   Collection Time: 10/24/14  9:50 PM  Result Value Ref Range   Retic Ct Pct 2.3 0.4 - 3.1 %   RBC. 2.67 (L) 4.22 - 5.81 MIL/uL   Retic Count, Manual 61.4 19.0 - 186.0 K/uL  CK     Status: None   Collection Time: 10/24/14  9:50 PM  Result Value Ref Range   Total CK 38 7 - 232 U/L  Glucose, capillary     Status: Abnormal   Collection Time: 10/25/14 12:07 AM  Result Value Ref Range   Glucose-Capillary 202 (H) 70 - 99 mg/dL  Glucose, capillary     Status: Abnormal   Collection Time: 10/25/14  4:14 AM  Result Value Ref Range    Glucose-Capillary 182 (H) 70 - 99 mg/dL  Urine rapid drug screen (hosp performed)     Status: Abnormal   Collection Time: 10/25/14  4:21 AM  Result Value Ref Range   Opiates POSITIVE (A) NONE DETECTED   Cocaine NONE DETECTED NONE DETECTED   Benzodiazepines NONE DETECTED NONE DETECTED   Amphetamines NONE DETECTED NONE DETECTED   Tetrahydrocannabinol NONE DETECTED NONE DETECTED   Barbiturates NONE DETECTED NONE DETECTED  CBC     Status: Abnormal   Collection Time: 10/25/14  5:46 AM  Result Value Ref Range   WBC 7.5 4.0 - 10.5 K/uL   RBC 3.69 (L) 4.22 - 5.81 MIL/uL   Hemoglobin 9.2 (L) 13.0 - 17.0 g/dL   HCT 30.3 (L) 39.0 - 52.0 %   MCV 82.1 78.0 - 100.0 fL   MCH 24.9 (L) 26.0 - 34.0 pg   MCHC 30.4 30.0 - 36.0 g/dL   RDW 15.8 (H) 11.5 - 15.5 %   Platelets 293 150 - 400 K/uL    Imaging/Diagnostic Tests: Mr Lumbar Spine Wo Contrast  10/24/2014   CLINICAL DATA:  Low back pain rated 10/10. Severe burning pain in the right lateral aspect of his neck.  EXAM: MRI LUMBAR SPINE WITHOUT CONTRAST  TECHNIQUE: Multiplanar, multisequence MR imaging of the lumbar spine was performed. No intravenous contrast was administered.  COMPARISON:  CT of the abdomen and pelvis 12/30/2009  FINDINGS: Normal signal is present in the conus medullaris which terminates at L1, within normal limits. T1 marrow signal is diffusely depressed. Template edematous endplate changes are present at L5-S1. No other focal signal changes are present. Vertebral body heights and alignment are normal.  Limited imaging of the abdomen is unremarkable.  The disc levels at L3-4 and above scratch the disc levels at L2-3 and above are normal.  L3-4: Mild leftward disc bulging is present without significant stenosis.  L4-5: A leftward disc protrusion is present. There is an annular tear. Mild subarticular narrowing is worse left than right. Mild foraminal narrowing is present bilaterally as well.  L5-S1: A leftward disc protrusion  is present.  Prominent epidural fat is evident. The disc protrudes anteriorly as well. The combination results in mild left subarticular narrowing. The foramina are patent.  IMPRESSION: 1. Leftward disc protrusion and prominent epidural fat at L5-S1 with mild left subarticular stenosis. 2. Leftward disc protrusion and annular tear at L4-5 with mild subarticular stenosis bilaterally, left greater than right. 3. Mild foraminal narrowing bilaterally at L4-5 as well.   Electronically Signed   By: Lawrence Santiago M.D.   On: 10/24/2014 16:11   Katheren Shams, DO 10/25/2014, 7:12 AM PGY-1, [place] Intern pager: [phone], text pages welcome

## 2014-10-25 NOTE — Progress Notes (Signed)
Nutrition Brief Note  Patient identified on the Malnutrition Screening Tool (MST) Report  Wt Readings from Last 15 Encounters:  10/24/14 262 lb 9.1 oz (119.1 kg)  10/16/14 271 lb (122.925 kg)  08/14/14 270 lb (122.471 kg)  05/03/14 288 lb (130.636 kg)  04/05/14 290 lb (131.543 kg)  03/28/14 299 lb (135.626 kg)  12/28/12 331 lb (150.141 kg)  08/02/12 327 lb (148.326 kg)  07/20/11 315 lb 1.9 oz (142.937 kg)  06/03/11 303 lb (137.44 kg)  06/27/10 298 lb 8 oz (135.399 kg)  06/25/10 294 lb (133.358 kg)  06/03/10 298 lb 1.9 oz (135.226 kg)  01/30/10 306 lb (138.801 kg)  07/19/08 269 lb (122.018 kg)    Body mass index is 35.6 kg/(m^2). Patient meets criteria for class II obesity based on current BMI.   Current diet order is heart healthy/carbohydrate modified. No percent meal completion recorded. Pt reports having a good appetite currently and PTA at home with no other difficulties. Pt has Ensure ordered and reports he would like to continue with them. Labs and medications reviewed.   No nutrition interventions warranted at this time. If nutrition issues arise, please consult RD.   Kallie Locks, MS, RD, LDN Pager # [phone] After hours/ weekend pager # [phone]

## 2014-10-26 ENCOUNTER — Encounter (HOSPITAL_COMMUNITY): Payer: Self-pay | Admitting: Urology

## 2014-10-26 DIAGNOSIS — R29898 Other symptoms and signs involving the musculoskeletal system: Secondary | ICD-10-CM

## 2014-10-26 DIAGNOSIS — R339 Retention of urine, unspecified: Secondary | ICD-10-CM

## 2014-10-26 LAB — IRON AND TIBC
Iron: <10 ug/dL — ABNORMAL LOW (ref 42–165)
UIBC: 146 ug/dL (ref 125–400)

## 2014-10-26 LAB — BASIC METABOLIC PANEL
Anion gap: 16 — ABNORMAL HIGH (ref 5–15)
BUN: 67 mg/dL — ABNORMAL HIGH (ref 6–23)
CO2: 18 mmol/L — ABNORMAL LOW (ref 19–32)
Calcium: 8.5 mg/dL (ref 8.4–10.5)
Chloride: 104 mEq/L (ref 96–112)
Creatinine, Ser: 3.11 mg/dL — ABNORMAL HIGH (ref 0.50–1.35)
GFR calc Af Amer: 25 mL/min — ABNORMAL LOW (ref >90)
GFR calc non Af Amer: 22 mL/min — ABNORMAL LOW (ref >90)
Glucose, Bld: 109 mg/dL — ABNORMAL HIGH (ref 70–99)
Potassium: 4.4 mmol/L (ref 3.5–5.1)
Sodium: 138 mmol/L (ref 135–145)

## 2014-10-26 LAB — URINE CULTURE: Colony Count: >=100000

## 2014-10-26 LAB — CBC
HCT: 31.9 % — ABNORMAL LOW (ref 39.0–52.0)
Hemoglobin: 9.4 g/dL — ABNORMAL LOW (ref 13.0–17.0)
MCH: 24.2 pg — ABNORMAL LOW (ref 26.0–34.0)
MCHC: 29.5 g/dL — ABNORMAL LOW (ref 30.0–36.0)
MCV: 82.2 fL (ref 78.0–100.0)
Platelets: 312 10*3/uL (ref 150–400)
RBC: 3.88 MIL/uL — ABNORMAL LOW (ref 4.22–5.81)
RDW: 15.7 % — ABNORMAL HIGH (ref 11.5–15.5)
WBC: 8.1 10*3/uL (ref 4.0–10.5)

## 2014-10-26 LAB — GLUCOSE, CAPILLARY
Glucose-Capillary: 107 mg/dL — ABNORMAL HIGH (ref 70–99)
Glucose-Capillary: 104 mg/dL — ABNORMAL HIGH (ref 70–99)
Glucose-Capillary: 146 mg/dL — ABNORMAL HIGH (ref 70–99)
Glucose-Capillary: 279 mg/dL — ABNORMAL HIGH (ref 70–99)
Glucose-Capillary: 236 mg/dL — ABNORMAL HIGH (ref 70–99)

## 2014-10-26 LAB — OCCULT BLOOD X 1 CARD TO LAB, STOOL: Fecal Occult Bld: NEGATIVE

## 2014-10-26 MED ORDER — SODIUM CHLORIDE 0.9 % IV SOLN
125.0000 mg | Freq: Every day | INTRAVENOUS | Status: DC
  Administered 2014-10-26 – 2014-10-29 (×4): 125 mg via INTRAVENOUS
  Filled 2014-10-26 (×10): qty 10

## 2014-10-26 MED ORDER — DIAZEPAM 5 MG/ML IJ SOLN
2.5000 mg | Freq: Three times a day (TID) | INTRAMUSCULAR | Status: DC
  Administered 2014-10-26 – 2014-10-27 (×2): 2.5 mg via INTRAVENOUS
  Filled 2014-10-26 (×2): qty 2

## 2014-10-26 MED ORDER — LEVOFLOXACIN 250 MG PO TABS
250.0000 mg | ORAL_TABLET | Freq: Every day | ORAL | Status: AC
  Administered 2014-10-26 – 2014-10-30 (×5): 250 mg via ORAL
  Filled 2014-10-26 (×5): qty 1

## 2014-10-26 NOTE — Consult Note (Signed)
  H&P read .MRI LUMBAR see. Patient admitted with cervical and lumbar pain. Patient claims no   radiation to upper or lower extremities. Had one episode of urinary retention which resolved with standing. We were called because of possibility of cauda equina syndrome. Patient tells me that the pain is associated with any movement of his body and made worse yesterday with pt.clinically he has no weakness, sensory grossly normal but he has some tingling in the lower extremities. SLR positive at 5 degrees with a lot of lumbar pain.mri shows some small protrusion at left l45,l5s1. No need for surgical intervention. i do advise a consult with Intervention Radiology for a nonselective epidural injection at l34. Patient agrees with the procedure but wants heavy sedation.

## 2014-10-26 NOTE — Progress Notes (Signed)
[facility] ROUNDING NOTE   Subjective:   Interval History: extensive history of urethral strictures   Objective:  Vital signs in last 24 hours:  Temp:  [98 F (36.7 C)-99.1 F (37.3 C)] 98.4 F (36.9 C) (01/15 0917) Pulse Rate:  [79-95] 84 (01/15 0917) Resp:  [17-20] 17 (01/15 0917) BP: (118-135)/(46-60) 134/58 mmHg (01/15 0917) SpO2:  [86 %-97 %] 89 % (01/15 0917) Weight:  [118.7 kg (261 lb 11 oz)] 118.7 kg (261 lb 11 oz) (01/14 2021)  Weight change: 0.311 kg (11 oz) Filed Weights   10/24/14 0939 10/24/14 2057 10/25/14 2021  Weight: 118.389 kg (261 lb) 119.1 kg (262 lb 9.1 oz) 118.7 kg (261 lb 11 oz)    Intake/Output: I/O last 3 completed shifts: In: 360 [P.O.:360] Out: 2050 [Urine:2050]   Intake/Output this shift:  Total I/O In: -  Out: 300 [Urine:300]  CVS- RRR RS- CTA ABD- BS present soft non-distended EXT- no edema   Basic Metabolic Panel:  Recent Labs Lab 10/24/14 1045 10/25/14 0430 10/26/14 0736  NA 131* 140 138  K 4.2 4.1 4.4  CL 98 101 104  CO2 22 22 18*  GLUCOSE 153* 189* 109*  BUN 61* 61* 67*  CREATININE 3.18* 3.09* 3.11*  CALCIUM 8.3* 8.7 8.5  PHOS  --  4.1  --     Liver Function Tests:  Recent Labs Lab 10/24/14 1045 10/25/14 0430  AST 15  --   ALT 12  --   ALKPHOS 88  --   BILITOT 1.1  --   PROT 6.3  --   ALBUMIN 2.8* 2.7*    Recent Labs Lab 10/24/14 1045  LIPASE 31   No results for input(s): AMMONIA in the last 168 hours.  CBC:  Recent Labs Lab 10/24/14 1045 10/25/14 0546 10/26/14 0736  WBC 9.2 7.5 8.1  HGB 9.3* 9.2* 9.4*  HCT 30.8* 30.3* 31.9*  MCV 80.2 82.1 82.2  PLT 304 293 312    Cardiac Enzymes:  Recent Labs Lab 10/24/14 2150  CKTOTAL 38    BNP: Invalid input(s): POCBNP  CBG:  Recent Labs Lab 10/25/14 2006 10/25/14 2358 10/26/14 0418 10/26/14 0742 10/26/14 1253  GLUCAP 78 103* 107* 104* 146*    Microbiology: Results for orders placed or performed during the hospital  encounter of 10/24/14  Urine culture     Status: None (Preliminary result)   Collection Time: 10/24/14 12:23 PM  Result Value Ref Range Status   Specimen Description URINE, RANDOM  Final   Special Requests ADDED [id number]  Final   Colony Count   Final    >=100,000 COLONIES/ML Performed at [facility]    Culture   Final    ENTEROCOCCUS SPECIES Performed at [facility]    Report Status PENDING  Incomplete    Coagulation Studies: No results for input(s): LABPROT, INR in the last 72 hours.  Urinalysis:  Recent Labs  10/24/14 1223  COLORURINE YELLOW  LABSPEC 1.013  PHURINE 5.0  GLUCOSEU NEGATIVE  HGBUR NEGATIVE  BILIRUBINUR NEGATIVE  KETONESUR NEGATIVE  PROTEINUR NEGATIVE  UROBILINOGEN 0.2  NITRITE NEGATIVE  LEUKOCYTESUR SMALL*      Imaging: Mr Lumbar Spine Wo Contrast  10/24/2014   CLINICAL DATA:  Low back pain rated 10/10. Severe burning pain in the right lateral aspect of his neck.  EXAM: MRI LUMBAR SPINE WITHOUT CONTRAST  TECHNIQUE: Multiplanar, multisequence MR imaging of the lumbar spine was performed. No intravenous contrast was administered.  COMPARISON:  CT of  the abdomen and pelvis 12/30/2009  FINDINGS: Normal signal is present in the conus medullaris which terminates at L1, within normal limits. T1 marrow signal is diffusely depressed. Template edematous endplate changes are present at L5-S1. No other focal signal changes are present. Vertebral body heights and alignment are normal.  Limited imaging of the abdomen is unremarkable.  The disc levels at L3-4 and above scratch the disc levels at L2-3 and above are normal.  L3-4: Mild leftward disc bulging is present without significant stenosis.  L4-5: A leftward disc protrusion is present. There is an annular tear. Mild subarticular narrowing is worse left than right. Mild foraminal narrowing is present bilaterally as well.  L5-S1: A leftward disc protrusion is present. Prominent epidural fat is  evident. The disc protrudes anteriorly as well. The combination results in mild left subarticular narrowing. The foramina are patent.  IMPRESSION: 1. Leftward disc protrusion and prominent epidural fat at L5-S1 with mild left subarticular stenosis. 2. Leftward disc protrusion and annular tear at L4-5 with mild subarticular stenosis bilaterally, left greater than right. 3. Mild foraminal narrowing bilaterally at L4-5 as well.   Electronically Signed   By: Lawrence Santiago M.D.   On: 10/24/2014 16:11   US Renal  10/25/2014   CLINICAL DATA:  Evaluate apparent lesion arising from the native right kidney. Personal history of renal transplant. Initial encounter.  EXAM: RENAL/URINARY TRACT ULTRASOUND COMPLETE  COMPARISON:  CT of the abdomen and pelvis performed 12/30/2009  FINDINGS: Right Kidney:  Length: 10.7 cm. Diffusely increased parenchymal echogenicity noted. Marked cortical thinning is seen. A 4.2 x 3.7 x 3.3 cm cyst is noted arising at the interpole region of the right kidney, simple in appearance. No hydronephrosis visualized.  Left Kidney:  Length: 9.1 cm. Diffusely increased parenchymal echogenicity noted. Marked cortical thinning is seen. No mass or hydronephrosis visualized.  Bladder:  Significantly distended and grossly unremarkable in appearance.  IMPRESSION: 1. 4.2 cm simple cyst at the interpole region of the right kidney. No suspicious masses seen. 2. Diffusely increased parenchymal echogenicity and marked bilateral native renal cortical thinning, compatible with the patient's known chronic native renal failure. The transplant kidney at the right iliac fossa is not assessed on this study. Per discussion with the clinical team, the only clinical question is in regard to the native right renal cyst, and thus assessment of the transplant kidney is not currently requested. 3. The bladder is significantly distended but otherwise grossly unremarkable. The patient does not describe any urinary urgency at this  time; would consider clinical correlation for urinary retention.   Electronically Signed   By: Garald Balding M.D.   On: 10/25/2014 07:19     Medications:     . aspirin  325 mg Oral Daily  . feeding supplement (ENSURE COMPLETE)  237 mL Oral BID BM  . ferric gluconate (FERRLECIT/NULECIT) IV  125 mg Intravenous Daily  . insulin aspart  0-9 Units Subcutaneous TID WC  . insulin glargine  40 Units Subcutaneous Daily  . mycophenolate  750 mg Oral BID  . pantoprazole  40 mg Oral Daily  . predniSONE  5 mg Oral Daily  . rosuvastatin  20 mg Oral QHS  . sodium chloride  3 mL Intravenous Q12H  . tacrolimus  1 mg Oral BID   sodium chloride, acetaminophen **OR** acetaminophen, clonazePAM, hydrALAZINE, HYDROcodone-acetaminophen, LORazepam, morphine injection, ondansetron **OR** ondansetron (ZOFRAN) IV, sodium chloride  Assessment/ Plan:   Acute kidney injury renal transplant with bladder outlet obstruction -- history of  urethral strictures and recommend urology consult  HTN controlled  Immunosuppression continue home dose  Anemia check iron stores    LOS: 2 [name] [name] W @TODAY @1 :39 PM

## 2014-10-26 NOTE — Clinical Social Work Psychosocial (Signed)
Clinical Social Work Department BRIEF PSYCHOSOCIAL ASSESSMENT 10/26/2014  Patient:  [name] [name], [name] [name]     Account Number:  [id number]     Admit date:  10/24/2014  Clinical Social Worker:  Frederico Hamman  Date/Time:  10/26/2014 11:08 AM  Referred by:  RN  Date Referred:  10/26/2014 Referred for  SNF Placement   Other Referral:   Interview type:  Patient Other interview type:    PSYCHOSOCIAL DATA Living Status:  FRIEND(S) Admitted from facility:   Level of care:   Primary support name:  Donalda Ewings Primary support relationship to patient:  FRIEND Degree of support available:   Patient reported that a friend Ms. Deatra Canter came from [place] and is currently staying with him.    CURRENT CONCERNS Current Concerns  Post-Acute Placement   Other Concerns:    SOCIAL WORK ASSESSMENT / PLAN CSW talked with patient about PT/MD recommendation of ST rehab, however patient is currently concerned about his pain and indicated that he would not be able to do rehab due to his extreme pain. Patient reports that his issue is not weakness but pain.  Patient and CSW discussed ST rehab and his pain issues and patient explained that he may be willing to go to rehab if the doctors can do something with the pain he is having. CSW provided empathy and support and advised patient that we would talk again before he is discharged and hopefully his pain will be better.   Assessment/plan status:  Psychosocial Support/Ongoing Assessment of Needs Other assessment/ plan:   Information/referral to community resources:   No information provided at inital visit.    PATIENT'S/FAMILY'S RESPONSE TO PLAN OF CARE: Patient was pleasant but clear that he cannot do rehab due to his pain.     [name] [name], MSW, LCSW Licensed Clinical Social Worker [place] [phone]

## 2014-10-26 NOTE — Progress Notes (Signed)
Patient voided 200 ml of urine. Post void bladder scan equals 463 ml.

## 2014-10-26 NOTE — Consult Note (Signed)
Subjective: I was asked to see [name] [name] by Dr Luiz Blare for possible retention.  [name] has a complicated history.   He has a history of diabetes with a renal transplant in 2009 for nephropathy.   He had an internal urethrotomy by Dr. Tresa Endo at [facility] in late December for urethral strictures.   He is admitted now with progressive back pain but he also has acute renal insufficiency.   He had a CT that shows a possible neoplasm in the native right kidney but a renal US showed this lesion to be a simple cyst but he was also found to have a bladder volume of 1566ml but denied urgency at that time.   He had a subsequent post void residual scan that was down to 426ml.   He has been voiding 100-200cc at a time every couple of hours and denies urgency, dysuria or hematuria.   He reports an adequate stream.   His UA this admission had mild pyuria but a culture grew enterococcus.  He had a negative culture in November but had the cystoscopy and VIU since that time.   It was recommended that he have foley placed but the patient has refused to have it placed out of concern for pain and prior difficulties with placement.    ROS:  Review of Systems  Constitutional: Negative for fever and chills.  HENT: Negative.   Eyes: Negative.   Respiratory: Negative.  Negative for cough and hemoptysis.   Cardiovascular: Negative.  Negative for chest pain and palpitations.  Gastrointestinal: Negative.  Negative for nausea, abdominal pain, diarrhea and constipation.  Genitourinary: Negative for dysuria, urgency, frequency, hematuria and flank pain.  Musculoskeletal:       He has moderately severe low back pain that began when he was switched recently from gabapentin to lyrica by Dr. Jannifer Franklin.    Skin: Negative for rash.  Neurological: Positive for tingling (that started in the right chest and moved down to his right leg. ), focal weakness (He has marked weakness in his left leg. ) and weakness.   Endo/Heme/Allergies: Negative.   Psychiatric/Behavioral: Negative.     Allergies  Allergen Reactions  . Penicillins Nausea Only and Other (See Comments)    Temp changes of body  . Pork-Derived Products Anaphylaxis and Other (See Comments)    Fever   . Ace Inhibitors Cough    Past Medical History  Diagnosis Date  . Diabetes mellitus   . Hypertension   . Leg pain 02/05/11    with walking  . Weight gain   . Shortness of breath     when lying flat  . Diminished eyesight   . Hearing difficulty   . Chronic kidney disease     Dia;ysis MWF  . Anemia   . Nervousness(799.21)   . COPD (chronic obstructive pulmonary disease)   . CHF (congestive heart failure)   . Posttraumatic stress disorder   . Allergic rhinitis   . Dyslipidemia   . Morbid obesity   . Diabetic peripheral neuropathy   . Gait disorder   . Obstructive sleep apnea   . Bilateral carpal tunnel syndrome   . Anxiety disorder   . Gastroparesis   . Gout   . Diabetic retinopathy   . Renal transplant, status post 04/05/2014  . Incomplete bladder emptying   . Urethral stricture     Past Surgical History  Procedure Laterality Date  . Kidney transplant  2009  . Cataract extraction w/  intraocular lens  implant, bilateral Bilateral   . Av fistula placement Bilateral   . Arteriovenous graft placement Bilateral "several"  . Cystoscopy w/ internal urethrotomy      History   Social History  . Marital Status: Divorced    Spouse Name: N/A    Number of Children: 0  . Years of Education: College    Occupational History  .     Social History Main Topics  . Smoking status: Never Smoker   . Smokeless tobacco: Never Used  . Alcohol Use: No  . Drug Use: No  . Sexual Activity: Not on file   Other Topics Concern  . Not on file   Social History Narrative   Patient is separated.   Patient does not have any children.   Patient is right-handed   Patient is currently in college working on his Ph.D.           Family History  Problem Relation Age of Onset  . Hypertension Mother   . Diabetes Father    Past, social and family history reviewed and updated.   Anti-infectives: Anti-infectives    None      Current Facility-Administered Medications  Medication Dose Route Frequency Provider Last Rate Last Dose  . 0.9 %  sodium chloride infusion  250 mL Intravenous PRN [place] N Rumley, DO      . acetaminophen (TYLENOL) tablet 650 mg  650 mg Oral Q6H PRN Lorna Few, DO       Or  . acetaminophen (TYLENOL) suppository 650 mg  650 mg Rectal Q6H PRN Burna Cash Rumley, DO      . aspirin tablet 325 mg  325 mg Oral Daily [place] N Rumley, DO   325 mg at 10/26/14 1016  . clonazePAM (KLONOPIN) tablet 0.5 mg  0.5 mg Oral TID PRN Lorna Few, DO      . feeding supplement (ENSURE COMPLETE) (ENSURE COMPLETE) liquid 237 mL  237 mL Oral BID BM Zigmund Gottron, MD   237 mL at 10/26/14 1400  . ferric gluconate (NULECIT) 125 mg in sodium chloride 0.9 % 100 mL IVPB  125 mg Intravenous Daily Sherril Croon, MD   125 mg at 10/26/14 1400  . hydrALAZINE (APRESOLINE) injection 10 mg  10 mg Intravenous Q6H PRN [employer], DO      . HYDROcodone-acetaminophen (NORCO/VICODIN) 5-325 MG per tablet 2 tablet  2 tablet Oral Q6H PRN Katheren Shams, DO   2 tablet at 10/26/14 1232  . insulin aspart (novoLOG) injection 0-9 Units  0-9 Units Subcutaneous TID WC Lorna Few, DO   5 Units at 10/26/14 1737  . insulin glargine (LANTUS) injection 40 Units  40 Units Subcutaneous Daily Coral Spikes, DO   40 Units at 10/26/14 1016  . LORazepam (ATIVAN) injection 1 mg  1 mg Intravenous Q4H PRN [place] N Rumley, DO   1 mg at 10/26/14 1251  . morphine 2 MG/ML injection 1 mg  1 mg Intravenous Q3H PRN Katheren Shams, DO   1 mg at 10/26/14 1250  . mycophenolate (CELLCEPT) capsule 750 mg  750 mg Oral BID  N Rumley, DO   750 mg at 10/26/14 1015  . ondansetron (ZOFRAN) tablet 4 mg  4 mg Oral Q6H PRN [facility],  DO   4 mg at 10/26/14 [id number]   Or  . ondansetron (ZOFRAN) injection 4 mg  4 mg Intravenous Q6H PRN [employer], DO      .  pantoprazole (PROTONIX) EC tablet 40 mg  40 mg Oral Daily [place] N Rumley, DO   40 mg at 10/26/14 1015  . predniSONE (DELTASONE) tablet 5 mg  5 mg Oral Daily [place] N Rumley, DO   5 mg at 10/26/14 1015  . rosuvastatin (CRESTOR) tablet 20 mg  20 mg Oral QHS [place] N Rumley, DO   20 mg at 10/25/14 2150  . sodium chloride 0.9 % injection 3 mL  3 mL Intravenous Q12H [place] N Rumley, DO   3 mL at 10/26/14 1016  . sodium chloride 0.9 % injection 3 mL  3 mL Intravenous PRN [place] N Rumley, DO      . tacrolimus (PROGRAF) capsule 1 mg  1 mg Oral BID [facility], DO   1 mg at 10/26/14 1015     Objective: Vital signs in last 24 hours: Temp:  [98 F (36.7 C)-99.1 F (37.3 C)] 98.4 F (36.9 C) (01/15 1700) Pulse Rate:  [56-95] 56 (01/15 1700) Resp:  [17-20] 17 (01/15 1700) BP: (118-145)/(46-60) 145/60 mmHg (01/15 1700) SpO2:  [86 %-97 %] 87 % (01/15 1700) Weight:  [118.7 kg (261 lb 11 oz)] 118.7 kg (261 lb 11 oz) (01/14 2021)  Intake/Output from previous day: 01/14 0701 - 01/15 0700 In: 240 [P.O.:240] Out: 1350 [Urine:1350] Intake/Output this shift: Total I/O In: 480 [P.O.:480] Out: 300 [Urine:300]   Physical Exam  Constitutional: He is oriented to person, place, and time and well-developed, well-nourished, and in no distress.  obese  HENT:  Head: Normocephalic and atraumatic.  Neck: Normal range of motion. Neck supple.  Cardiovascular: Normal rate and regular rhythm.   Pulmonary/Chest: Effort normal and breath sounds normal. No respiratory distress.  Abdominal: Soft. He exhibits no distension and no mass. There is no tenderness. There is no rebound and no guarding.  obese  Genitourinary:  He reports a recent unremarkable rectal exam by Dr. Rosana Hoes and would not like to repeat that at this time.   Musculoskeletal: He exhibits no edema or tenderness.   He has markedly reduced strength in his left leg.   Neurological: He is alert and oriented to person, place, and time.  Skin: Skin is warm and dry.  Psychiatric: Affect and judgment normal.    Lab Results:   Recent Labs  10/25/14 0546 10/26/14 0736  WBC 7.5 8.1  HGB 9.2* 9.4*  HCT 30.3* 31.9*  PLT 293 312   BMET  Recent Labs  10/25/14 0430 10/26/14 0736  NA 140 138  K 4.1 4.4  CL 101 104  CO2 22 18*  GLUCOSE 189* 109*  BUN 61* 67*  CREATININE 3.09* 3.11*  CALCIUM 8.7 8.5   PT/INR No results for input(s): LABPROT, INR in the last 72 hours. ABG No results for input(s): PHART, HCO3 in the last 72 hours.  Invalid input(s): PCO2, PO2  Studies/Results: US Renal  10/25/2014   CLINICAL DATA:  Evaluate apparent lesion arising from the native right kidney. Personal history of renal transplant. Initial encounter.  EXAM: RENAL/URINARY TRACT ULTRASOUND COMPLETE  COMPARISON:  CT of the abdomen and pelvis performed 12/30/2009  FINDINGS: Right Kidney:  Length: 10.7 cm. Diffusely increased parenchymal echogenicity noted. Marked cortical thinning is seen. A 4.2 x 3.7 x 3.3 cm cyst is noted arising at the interpole region of the right kidney, simple in appearance. No hydronephrosis visualized.  Left Kidney:  Length: 9.1 cm. Diffusely increased parenchymal echogenicity noted. Marked cortical thinning is seen. No mass or hydronephrosis visualized.  Bladder:  Significantly distended and grossly unremarkable in appearance.  IMPRESSION: 1. 4.2 cm simple cyst at the interpole region of the right kidney. No suspicious masses seen. 2. Diffusely increased parenchymal echogenicity and marked bilateral native renal cortical thinning, compatible with the patient's known chronic native renal failure. The transplant kidney at the right iliac fossa is not assessed on this study. Per discussion with the clinical team, the only clinical question is in regard to the native right renal cyst, and thus assessment  of the transplant kidney is not currently requested. 3. The bladder is significantly distended but otherwise grossly unremarkable. The patient does not describe any urinary urgency at this time; would consider clinical correlation for urinary retention.   Electronically Signed   By: Garald Balding M.D.   On: 10/25/2014 07:19   I have reviewed the patient's [place] films and labs and discussed the case with Dr. Gerarda Fraction.  Assessment: He has an elevated PVR with probable diabetic cystopathy and recent urethral stricture management.  He is voiding without discomfort and while his PVR is elevated to 400-510ml, he had 1533ml in his bladder at the time of the [place] and had no discomfort and was able to empty down to 437ml which suggests he probably has a non-obstructive elevated PVR from the diabetic bladder. He has enterococcus on urine culture, but I don't see that he has been treated for that.   Plan: He continues to refuse foley placement without sedation or anesthesia and he is not NPO at this time. Since he is voiding, I think it would be reasonable to monitor his PVR's q6hrs and if it increases or remains elevated and the Cr continues to climb, it would be worthwhile to insert the foley under anesthesia.   He needs to be given appropriate antibiotics for the enterococcal infection.  I will make him NPO post MN just in case we need to add him on tomorrow.   CC:  Dr. Luiz Blare    LOS: 2 days    Malka So 10/26/2014

## 2014-10-26 NOTE — Progress Notes (Signed)
Post void bladder scan completed and documented in flow sheets. Pt able to void 100cc of urine. Post void bladder scan showed 550cc in bladder. Dr. Ree Kida present during scan.

## 2014-10-26 NOTE — Progress Notes (Signed)
Family Medicine Teaching Service Daily Progress Note Intern Pager: [phone]  Patient name: [name] [name] Medical record number: [id number] Date of birth: 05/07/1966 Age: 49 y.o. Gender: male  Primary Care Provider: [name], MD Consultants: Nephro Code Status: Full  Pt Overview and Major Events to Date:  1/14: Admitted for AKI in setting of hx renal transplant  Assessment and Plan: [name] [name] is a 49 y.o. male presenting with back and neck pain, noted to have AKI with Creatinine of 3.18. PMH is significant for CKD s/p renal transplant, HTN, DM type 2, COPD, anxiety, and hyperlipidemia.  # AKI with h/o CKD s/p renal transplant: History of renal transplant in 2009. Renal failure caused by diabetic nephropathy. States last visited nephrologist in December 2015. Has not missed dose of transplant medications, which includes Cellcept, Prednisone, and Prograf. Has not had anything to eat or drink in 2 days. States he has been taking a lot of pain medication for his back pain recently. Baseline Creatinine 2.1. AKI likely due to dehydration vs post renal obstruction given recent urological procedure and mild hydronephroses noted on recent renal US (transplanted kidney in RLQ) Urinalysis few bacteria, small leukocytes, hyaline casts Rnel mass noted on CT. - Creatinine 3.18 at admission. BUN 61. GFR 25. - currently Cr 3.11   -urine culture grown enterococcus species >100,00 colonies. Awaiting sensetivity -Renal US with minimal hydronephrosis of renal transplant. -s/p 1L bolus of NS - Avoid nephrotoxic medications - Continue Cellcept, Prednisone, and Prograf - Holding Lasix 80mg  BID - renal consulted; appreciate recs - Repeat renal panel pending  # Anemia: Hemoglobin 9.3 on admission. No evidence of bleeding. Prior to admission Hbgs 12-13. This could indicate worsening renal function.  - FOBT pending - patient was unable and declined any rectal exam x2. - iron studies low - patient  receiving ferric gluconate - Hbg this morning 9.4 - stable - Continue to monitor and trend Hbg  #Urinary retention: Patient with recent urologic procedure. Possible due to some kind of obstruction. Patient states he voids better after procedure. Urologic procedure performed at [facility] for urethral stricture.  -Continue to monitor -post-void residual with ~450cc. But will order another today per urology. If greater than >400 patient needs foley due to risk of bladder distension and damage. -scheduled voids for patient - foley was strongly encouraged to patient but he adamantly denied x2. -urology consulted; appreciate recs - they will try and visit patient. They wanted another post void residual and suggested foley placement.  -mild hydronephrosis noted on renal US  # Back pain: MRI lumbar spine- disc protrusions L4-L5 and L5-S1 with stenosis and annular tear.  - Holding home muscle relaxers due to AKI - Morphine 1mg  q3hrs PRN severe pain, Norco prn for moderate pain, Acetaminophen prn for mild pain - Ativan Lorazepam 1mg  q4hr PRN muscle spasm -PT/OT consulted - SNF recommened -in setting of urinary rentention, back pain, and neuropathy will perform rectal evaluation for sphincter tone - patient declined and was unwilling to participate in exam x2. Will continue to try. -neurosurgery consulted; appreciate recs  # HTN: BP last 24/hr (115-135/49-60). Home medication includes Clonodine 0.1mg  PRN elevated BP. BPs stable. - Clonidine held - Hydralazine PRN SBP >180 or DBP >110 - Continue to monitor  # DM: Patient became hypoglycemic yesterday to ~70s. Home medications include Lantus 90U TID and Novolog 50U five times a day. -A1c 5.9  -reticulocyte normal - patient not responding to anemia appropriately - changed regimen to Lantus 40U QHS and sensitive SSI -  monitor CBGs  # Anxiety: Home medication includes Klonopin 0.5mg  TID PRN anxiety - Also giving Ativan for muscle spasm - Continue home  medication  FEN/GI: Heart Healthy/Carb Modified Diet Prophylaxis: SCDs (Allergic to Pork Derived Products with Anaphylaxis)  Disposition: Continue current management; pending further work-up.  Subjective:  Patient endorsing worsening back pain this morning. He states that the pain medication is not working for him. He states that he can not feel his feet. He wants to know what can be done for his back. He is unwilling to move due to severe pain.   Objective: Temp:  [98 F (36.7 C)-99.1 F (37.3 C)] 98.4 F (36.9 C) (01/15 0917) Pulse Rate:  [79-95] 84 (01/15 0917) Resp:  [17-20] 17 (01/15 0917) BP: (118-135)/(46-60) 134/58 mmHg (01/15 0917) SpO2:  [86 %-97 %] 89 % (01/15 0917) Weight:  [261 lb 11 oz (118.7 kg)] 261 lb 11 oz (118.7 kg) (01/14 2021) Physical Exam: General: 49yo male in mild distress secondary to pain Cardiovascular: S1 and S2 noted. No murmurs/rubs/gallops. Regular rate and rhythm. Respiratory: Clear to auscultation bilaterally. No wheezes/rales/rhonci. Abdomen: Bowel sounds noted. Soft and nondistended. No tenderness or masses with palpation. No CVA tenderness noted. Muskuloskeletal: No edema noted. Positive straight leg raises bilaterally. Pain with active movement of legs. Skin: No rashes noted. WWP. Distal pulses intact Neuro: Decreased sensation in bilateral feet otherwise sensation grossly intact. Normal tone and bulk. ROM limited but patient moves all extremities. Strength 4/5 throughout. A&Ox3.  Laboratory: Results for orders placed or performed during the hospital encounter of 10/24/14 (from the past 24 hour(s))  Glucose, capillary     Status: None   Collection Time: 10/25/14 11:45 AM  Result Value Ref Range   Glucose-Capillary 79 70 - 99 mg/dL  Iron and TIBC     Status: Abnormal   Collection Time: 10/25/14 12:00 PM  Result Value Ref Range   Iron 12 (L) 42 - 165 ug/dL   TIBC 169 (L) 215 - 435 ug/dL   Saturation Ratios 7 (L) 20 - 55 %   UIBC 157 125 -  400 ug/dL  Glucose, capillary     Status: None   Collection Time: 10/25/14  4:19 PM  Result Value Ref Range   Glucose-Capillary 76 70 - 99 mg/dL  Glucose, capillary     Status: None   Collection Time: 10/25/14  8:06 PM  Result Value Ref Range   Glucose-Capillary 78 70 - 99 mg/dL  Glucose, capillary     Status: Abnormal   Collection Time: 10/25/14 11:58 PM  Result Value Ref Range   Glucose-Capillary 103 (H) 70 - 99 mg/dL  Glucose, capillary     Status: Abnormal   Collection Time: 10/26/14  4:18 AM  Result Value Ref Range   Glucose-Capillary 107 (H) 70 - 99 mg/dL  Basic metabolic panel     Status: Abnormal   Collection Time: 10/26/14  7:36 AM  Result Value Ref Range   Sodium 138 135 - 145 mmol/L   Potassium 4.4 3.5 - 5.1 mmol/L   Chloride 104 96 - 112 mEq/L   CO2 18 (L) 19 - 32 mmol/L   Glucose, Bld 109 (H) 70 - 99 mg/dL   BUN 67 (H) 6 - 23 mg/dL   Creatinine, Ser 3.11 (H) 0.50 - 1.35 mg/dL   Calcium 8.5 8.4 - 10.5 mg/dL   GFR calc non Af Amer 22 (L) >90 mL/min   GFR calc Af Amer 25 (L) >90 mL/min   Anion gap  16 (H) 5 - 15  CBC     Status: Abnormal   Collection Time: 10/26/14  7:36 AM  Result Value Ref Range   WBC 8.1 4.0 - 10.5 K/uL   RBC 3.88 (L) 4.22 - 5.81 MIL/uL   Hemoglobin 9.4 (L) 13.0 - 17.0 g/dL   HCT 31.9 (L) 39.0 - 52.0 %   MCV 82.2 78.0 - 100.0 fL   MCH 24.2 (L) 26.0 - 34.0 pg   MCHC 29.5 (L) 30.0 - 36.0 g/dL   RDW 15.7 (H) 11.5 - 15.5 %   Platelets 312 150 - 400 K/uL  Glucose, capillary     Status: Abnormal   Collection Time: 10/26/14  7:42 AM  Result Value Ref Range   Glucose-Capillary 104 (H) 70 - 99 mg/dL    Imaging/Diagnostic Tests: Mr Lumbar Spine Wo Contrast  10/24/2014   CLINICAL DATA:  Low back pain rated 10/10. Severe burning pain in the right lateral aspect of his neck.  EXAM: MRI LUMBAR SPINE WITHOUT CONTRAST  TECHNIQUE: Multiplanar, multisequence MR imaging of the lumbar spine was performed. No intravenous contrast was administered.   COMPARISON:  CT of the abdomen and pelvis 12/30/2009  FINDINGS: Normal signal is present in the conus medullaris which terminates at L1, within normal limits. T1 marrow signal is diffusely depressed. Template edematous endplate changes are present at L5-S1. No other focal signal changes are present. Vertebral body heights and alignment are normal.  Limited imaging of the abdomen is unremarkable.  The disc levels at L3-4 and above scratch the disc levels at L2-3 and above are normal.  L3-4: Mild leftward disc bulging is present without significant stenosis.  L4-5: A leftward disc protrusion is present. There is an annular tear. Mild subarticular narrowing is worse left than right. Mild foraminal narrowing is present bilaterally as well.  L5-S1: A leftward disc protrusion is present. Prominent epidural fat is evident. The disc protrudes anteriorly as well. The combination results in mild left subarticular narrowing. The foramina are patent.  IMPRESSION: 1. Leftward disc protrusion and prominent epidural fat at L5-S1 with mild left subarticular stenosis. 2. Leftward disc protrusion and annular tear at L4-5 with mild subarticular stenosis bilaterally, left greater than right. 3. Mild foraminal narrowing bilaterally at L4-5 as well.   Electronically Signed   By: Lawrence Santiago M.D.   On: 10/24/2014 16:11   Katheren Shams, DO 10/26/2014, 11:36 AM PGY-1, [place] Intern pager: [phone], text pages welcome

## 2014-10-27 DIAGNOSIS — M545 Low back pain, unspecified: Secondary | ICD-10-CM | POA: Insufficient documentation

## 2014-10-27 LAB — CBC
HCT: 32.1 % — ABNORMAL LOW (ref 39.0–52.0)
Hemoglobin: 9.5 g/dL — ABNORMAL LOW (ref 13.0–17.0)
MCH: 24.2 pg — ABNORMAL LOW (ref 26.0–34.0)
MCHC: 29.6 g/dL — ABNORMAL LOW (ref 30.0–36.0)
MCV: 81.7 fL (ref 78.0–100.0)
Platelets: 334 10*3/uL (ref 150–400)
RBC: 3.93 MIL/uL — ABNORMAL LOW (ref 4.22–5.81)
RDW: 15.6 % — ABNORMAL HIGH (ref 11.5–15.5)
WBC: 8.1 10*3/uL (ref 4.0–10.5)

## 2014-10-27 LAB — BASIC METABOLIC PANEL
Anion gap: 8 (ref 5–15)
BUN: 73 mg/dL — ABNORMAL HIGH (ref 6–23)
CO2: 25 mmol/L (ref 19–32)
Calcium: 8.3 mg/dL — ABNORMAL LOW (ref 8.4–10.5)
Chloride: 106 mEq/L (ref 96–112)
Creatinine, Ser: 2.89 mg/dL — ABNORMAL HIGH (ref 0.50–1.35)
GFR calc Af Amer: 28 mL/min — ABNORMAL LOW (ref >90)
GFR calc non Af Amer: 24 mL/min — ABNORMAL LOW (ref >90)
Glucose, Bld: 206 mg/dL — ABNORMAL HIGH (ref 70–99)
Potassium: 5 mmol/L (ref 3.5–5.1)
Sodium: 139 mmol/L (ref 135–145)

## 2014-10-27 LAB — GLUCOSE, CAPILLARY
Glucose-Capillary: 153 mg/dL — ABNORMAL HIGH (ref 70–99)
Glucose-Capillary: 168 mg/dL — ABNORMAL HIGH (ref 70–99)
Glucose-Capillary: 186 mg/dL — ABNORMAL HIGH (ref 70–99)
Glucose-Capillary: 198 mg/dL — ABNORMAL HIGH (ref 70–99)
Glucose-Capillary: 200 mg/dL — ABNORMAL HIGH (ref 70–99)
Glucose-Capillary: 220 mg/dL — ABNORMAL HIGH (ref 70–99)

## 2014-10-27 MED ORDER — MORPHINE SULFATE 2 MG/ML IJ SOLN
1.0000 mg | Freq: Once | INTRAMUSCULAR | Status: AC
  Administered 2014-10-27: 1 mg via INTRAVENOUS
  Filled 2014-10-27: qty 1

## 2014-10-27 MED ORDER — DIAZEPAM 5 MG/ML IJ SOLN
5.0000 mg | Freq: Three times a day (TID) | INTRAMUSCULAR | Status: DC
  Administered 2014-10-27 – 2014-10-30 (×9): 5 mg via INTRAVENOUS
  Filled 2014-10-27 (×9): qty 2

## 2014-10-27 MED ORDER — INSULIN ASPART 100 UNIT/ML ~~LOC~~ SOLN
0.0000 [IU] | Freq: Three times a day (TID) | SUBCUTANEOUS | Status: DC
  Administered 2014-10-27: 3 [IU] via SUBCUTANEOUS
  Administered 2014-10-27: 5 [IU] via SUBCUTANEOUS
  Administered 2014-10-28 – 2014-10-29 (×2): 3 [IU] via SUBCUTANEOUS
  Administered 2014-10-30: 8 [IU] via SUBCUTANEOUS
  Administered 2014-10-30 (×2): 11 [IU] via SUBCUTANEOUS

## 2014-10-27 MED ORDER — MORPHINE SULFATE 2 MG/ML IJ SOLN
2.0000 mg | INTRAMUSCULAR | Status: DC | PRN
  Administered 2014-10-27 – 2014-10-29 (×8): 2 mg via INTRAVENOUS
  Filled 2014-10-27 (×8): qty 1

## 2014-10-27 NOTE — Progress Notes (Signed)
Patient complain of pain and has >1000 mL via bladder scan. Spoke with MD and was given order for an in and out catheter.  Patient refused in and out catheter. Patient stated that he needs his lasix and wants to see the doctor.  MD notified.

## 2014-10-27 NOTE — Progress Notes (Signed)
Family Medicine Teaching Service Daily Progress Note Intern Pager: [phone]  Patient name: [name] [name] Medical record number: [id number] Date of birth: [birth year] Age: 49 y.o. Gender: male  Primary Care Provider: [name], MD Consultants: Nephro Code Status: Full  Pt Overview and Major Events to Date:  1/13: Admitted for AKI in setting of hx renal transplant 1/13: Renal US with minimal hydronephrosis of renal transplant.  Assessment and Plan: [name] [name] is a 49 y.o. male presenting with back and neck pain, noted to have AKI with Creatinine of 3.18. PMH is significant for CKD s/p renal transplant, HTN, DM type 2, COPD, anxiety, and hyperlipidemia.  # AKI with h/o CKD s/p renal transplant: History of renal transplant in 2009. Renal failure caused by diabetic nephropathy. Baseline Creatinine 2.1. AKI likely due to dehydration vs post renal obstruction given recent urological procedure and mild hydronephroses noted on recent renal US (transplanted kidney in RLQ). Creatinine of 2.66 on admission with a nadir of 3.18. Creatine continuing to improve. Creatinine down to 2.64 on 1/17 - Avoid nephrotoxic medications - Continue Cellcept, Prednisone, and Prograf - Holding Lasix 80mg  BID - renal consulted; appreciate recs  # Anemia: Hemoglobin 9.3 on admission. No evidence of bleeding. Prior to admission Hbgs 12-13. This could indicate worsening renal function. Stable.  - FOBT pending - iron studies low - patient receiving ferric gluconate - Continue to monitor and trend Hbg -appreciate renal recs  #Urinary retention: Patient with recent urologic procedure for urethral stricture. Urologic procedure performed at [place] due to some diabetic uropathy. Mild hydronephrosis noted on renal US. Last PVR of 772cc on 1/17 in AM -Continue to monitor -scheduled voids for patient -urology consulted; appreciate recs - no need for foley currently; continue PVR scans daily.  #UTI: urine  culture grew enterococcus species >100,00 colonies. -Continue Levaquin x5 days (1/15>>  # Back pain: MRI lumbar spine- disc protrusions L4-L5 and L5-S1 with stenosis and annular tear. SNF recommended by PT - Holding home muscle relaxers due to AKI - Morphine 1mg  q3hrs PRN severe pain, Norco prn for moderate pain, Acetaminophen prn for mild pain -patient found to have decreased rectal tone yesterday -neurosurgery consulted; appreciate recs - recommended IR epidural. Order and consult placed. Will not be able to do until Monday. - T-spine MRI today (1/17)  # HTN: BP slightly elevated in last 24hr. Home medication includes Clonodine 0.1mg  PRN elevated BP. BPs stable. - Clonidine held - Hydralazine PRN SBP >180 or DBP >110 - Continue to monitor  # DM: Fasting CBG of 168. Received 10 units of Novolog in last 24 hours. - A1c 5.9  - Continue Lantus 40U QHS and mod SSI - monitor CBGs  # Anxiety: Home medication includes Klonopin 0.5mg  TID PRN anxiety - Valium 5mg  q8hrs scheduled - Continue Klonipin prn  FEN/GI: Heart Healthy/Carb Modified Diet, NPO after midnight, KVO Prophylaxis: SCDs (Allergic to Pork Derived Products with Anaphylaxis)  Disposition: Continue current management; pending improvement in pain and SNF bed.  Subjective:  Patient's pain is persistent. Unwilling to move due to pain. No urge to urinate. Last PVR of 772cc this morning.  Objective: Temp:  [97.6 F (36.4 C)-98.4 F (36.9 C)] 98.4 F (36.9 C) (01/17 0521) Pulse Rate:  [83-94] 94 (01/17 0521) Resp:  [17-19] 19 (01/17 0521) BP: (138-168)/(29-87) 166/87 mmHg (01/17 0521) SpO2:  [93 %-98 %] 93 % (01/17 0521) Weight:  [262 lb (118.842 kg)] 262 lb (118.842 kg) (01/16 1940)  Physical Exam: General: alert, NAD Cardiovascular: RRR. No  murmurs/rubs/gallops.  Respiratory: Clear to auscultation bilaterally anteriorly Abdomen: Bowel sounds noted. Soft and nondistended. No tenderness or masses with palpation. No CVA  tenderness noted. Muskuloskeletal: No edema noted.  Skin: No rashes noted. WWP Neuro: limited exam due to patient compliance and pain  Laboratory: Results for orders placed or performed during the hospital encounter of 10/24/14 (from the past 24 hour(s))  Glucose, capillary     Status: Abnormal   Collection Time: 10/27/14 11:27 AM  Result Value Ref Range   Glucose-Capillary 220 (H) 70 - 99 mg/dL  Glucose, capillary     Status: Abnormal   Collection Time: 10/27/14  5:06 PM  Result Value Ref Range   Glucose-Capillary 168 (H) 70 - 99 mg/dL  Glucose, capillary     Status: Abnormal   Collection Time: 10/27/14  7:43 PM  Result Value Ref Range   Glucose-Capillary 153 (H) 70 - 99 mg/dL  Basic metabolic panel     Status: Abnormal   Collection Time: 10/28/14  5:40 AM  Result Value Ref Range   Sodium 137 135 - 145 mmol/L   Potassium 4.3 3.5 - 5.1 mmol/L   Chloride 103 96 - 112 mEq/L   CO2 24 19 - 32 mmol/L   Glucose, Bld 168 (H) 70 - 99 mg/dL   BUN 71 (H) 6 - 23 mg/dL   Creatinine, Ser 2.64 (H) 0.50 - 1.35 mg/dL   Calcium 8.6 8.4 - 10.5 mg/dL   GFR calc non Af Amer 27 (L) >90 mL/min   GFR calc Af Amer 31 (L) >90 mL/min   Anion gap 10 5 - 15  CBC     Status: Abnormal   Collection Time: 10/28/14  5:40 AM  Result Value Ref Range   WBC 8.2 4.0 - 10.5 K/uL   RBC 4.33 4.22 - 5.81 MIL/uL   Hemoglobin 10.4 (L) 13.0 - 17.0 g/dL   HCT 35.0 (L) 39.0 - 52.0 %   MCV 80.8 78.0 - 100.0 fL   MCH 24.0 (L) 26.0 - 34.0 pg   MCHC 29.7 (L) 30.0 - 36.0 g/dL   RDW 15.6 (H) 11.5 - 15.5 %   Platelets 310 150 - 400 K/uL    Imaging/Diagnostic Tests: No results found.  Cordelia Poche, MD 10/28/2014, 8:39 AM PGY-2, [place] Intern pager: [phone], text pages welcome

## 2014-10-27 NOTE — Progress Notes (Signed)
FPTS Interim Progress Note  Called by nurse and reported bladder scan 1100cc, pt unable to void. Asked nurse to do in and out cath but patient refused. I went and spoke with the patient who requests his lasix, stating that the medical team has not been giving him it and it is the reason he is having difficulty with urinating. 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 He alternated between normal discussion and working himself up and on the verge of tears. I explained the risks of refusing the I&O cath and being unable to void, explained he is at risk for bladder rupture. Pt continued to refuse I&O cath at this time, wanted to wait and see if he would void on his own. Discussed with nursing staff to have him try to void for the next 30-60 minutes and repeat bladder scan if unable to void, and if patient still refusing I&O cath to page me back.  Tawanna Sat, MD 10/27/2014, 5:05 PM PGY-2, [place]

## 2014-10-27 NOTE — Progress Notes (Signed)
Patient voided 225 ml, post void bladder scan equals 520 ml.

## 2014-10-27 NOTE — Progress Notes (Signed)
Pt bladder scan for >1048ml after voiding 16ml in urinal. Reported off to incoming RN. Francis Gaines Vyncent Overby RN.

## 2014-10-27 NOTE — Progress Notes (Signed)
Patient ID: [name] [name], male   DOB: 10/18/1965, 49 y.o.   MRN: [id number]    Subjective: [name] [name] has voided 1254ml in the past 24hrs and his net I/O is -443ml.   His PVR has remained stable in the 400-561ml range and he has no voiding complaints.  He has been started on levaquin for the enterococcus.   The Cr is pending.  ROS:  Review of Systems  Constitutional: Negative for fever.  Gastrointestinal: Negative for abdominal pain.  Musculoskeletal: Positive for back pain (he is unable to stand to void which is probably contributing to his increased PVR).    Anti-infectives: Anti-infectives    Start     Dose/Rate Route Frequency Ordered Stop   10/26/14 2200  levofloxacin (LEVAQUIN) tablet 250 mg     250 mg Oral Daily 10/26/14 2148 10/31/14 0959      Current Facility-Administered Medications  Medication Dose Route Frequency Provider Last Rate Last Dose  . 0.9 %  sodium chloride infusion  250 mL Intravenous PRN [place] N Rumley, DO      . acetaminophen (TYLENOL) tablet 650 mg  650 mg Oral Q6H PRN Lorna Few, DO       Or  . acetaminophen (TYLENOL) suppository 650 mg  650 mg Rectal Q6H PRN Burna Cash Rumley, DO      . aspirin tablet 325 mg  325 mg Oral Daily [place] N Rumley, DO   325 mg at 10/26/14 1016  . clonazePAM (KLONOPIN) tablet 0.5 mg  0.5 mg Oral TID PRN Lorna Few, DO      . diazepam (VALIUM) injection 2.5 mg  2.5 mg Intravenous 3 times per day [facility], DO   2.5 mg at 10/27/14 0536  . feeding supplement (ENSURE COMPLETE) (ENSURE COMPLETE) liquid 237 mL  237 mL Oral BID BM Zigmund Gottron, MD   237 mL at 10/26/14 1400  . ferric gluconate (NULECIT) 125 mg in sodium chloride 0.9 % 100 mL IVPB  125 mg Intravenous Daily Sherril Croon, MD   125 mg at 10/26/14 1400  . hydrALAZINE (APRESOLINE) injection 10 mg  10 mg Intravenous Q6H PRN [employer], DO      . HYDROcodone-acetaminophen (NORCO/VICODIN) 5-325 MG per tablet 2 tablet  2 tablet Oral Q6H PRN Katheren Shams, DO   2 tablet at 10/26/14 1232  . insulin aspart (novoLOG) injection 0-9 Units  0-9 Units Subcutaneous TID WC Lorna Few, DO   5 Units at 10/26/14 1737  . insulin glargine (LANTUS) injection 40 Units  40 Units Subcutaneous Daily Coral Spikes, DO   40 Units at 10/26/14 1016  . levofloxacin (LEVAQUIN) tablet 250 mg  250 mg Oral Daily Katheren Shams, DO   250 mg at 10/26/14 2253  . morphine 2 MG/ML injection 1 mg  1 mg Intravenous Q3H PRN Katheren Shams, DO   1 mg at 10/27/14 0410  . mycophenolate (CELLCEPT) capsule 750 mg  750 mg Oral BID [facility], DO   750 mg at 10/26/14 2129  . ondansetron (ZOFRAN) tablet 4 mg  4 mg Oral Q6H PRN [facility], DO   4 mg at 10/26/14 [id number]   Or  . ondansetron (ZOFRAN) injection 4 mg  4 mg Intravenous Q6H PRN [place] N Rumley, DO      . pantoprazole (PROTONIX) EC tablet 40 mg  40 mg Oral Daily [place] N Rumley, DO   40 mg at 10/26/14 1015  . predniSONE (  DELTASONE) tablet 5 mg  5 mg Oral Daily [place] N Rumley, DO   5 mg at 10/26/14 1015  . rosuvastatin (CRESTOR) tablet 20 mg  20 mg Oral QHS [place] N Rumley, DO   20 mg at 10/26/14 2129  . sodium chloride 0.9 % injection 3 mL  3 mL Intravenous Q12H [place] N Rumley, DO   3 mL at 10/26/14 2200  . sodium chloride 0.9 % injection 3 mL  3 mL Intravenous PRN Lorna Few, DO      . tacrolimus (PROGRAF) capsule 1 mg  1 mg Oral BID [facility], DO   1 mg at 10/26/14 2129     Objective: Vital signs in last 24 hours: Temp:  [98 F (36.7 C)-98.6 F (37 C)] 98.6 F (37 C) (01/16 0610) Pulse Rate:  [56-84] 83 (01/16 0610) Resp:  [17-21] 21 (01/16 0610) BP: (134-145)/(58-61) 141/61 mmHg (01/16 0610) SpO2:  [87 %-96 %] 96 % (01/16 0610) Weight:  [118.389 kg (261 lb)] 118.389 kg (261 lb) (01/15 2024)  Intake/Output from previous day: 01/15 0701 - 01/16 0700 In: 710 [P.O.:600; IV Piggyback:110] Out: 1200 [Urine:1200] Intake/Output this shift:     Physical Exam  Constitutional: He  is well-developed, well-nourished, and in no distress.  Abdominal: Soft. There is no tenderness.  Musculoskeletal: He exhibits no edema.  Vitals reviewed.   Lab Results:   Recent Labs  10/26/14 0736 10/27/14 0531  WBC 8.1 8.1  HGB 9.4* 9.5*  HCT 31.9* 32.1*  PLT 312 334   BMET  Recent Labs  10/25/14 0430 10/26/14 0736  NA 140 138  K 4.1 4.4  CL 101 104  CO2 22 18*  GLUCOSE 189* 109*  BUN 61* 67*  CREATININE 3.09* 3.11*  CALCIUM 8.7 8.5   PT/INR No results for input(s): LABPROT, INR in the last 72 hours. ABG No results for input(s): PHART, HCO3 in the last 72 hours.  Invalid input(s): PCO2, PO2  Studies/Results: No results found.   I have reviewed his I&O's and labs  Assessment: He is voiding adequately with a stable elevated PVR that is probably secondary to diabetic cystopathy and having to void while lying down.   Plan: I don't think he needs a foley at this time, but would recommend continuing qshift PVR checks to make sure the bladder doesn't decompensate over time.  I have written for clear liquids since he doesn't need to go to the OR and will defer further diet orders to his primary team.      LOS: 3 days    [name] [name] J 10/27/2014

## 2014-10-27 NOTE — Progress Notes (Signed)
Family Medicine Teaching Service Daily Progress Note Intern Pager: [phone]  Patient name: [name] [name] Medical record number: [id number] Date of birth: 1966-05-06 Age: 49 y.o. Gender: male  Primary Care Provider: [name], MD Consultants: Nephro Code Status: Full  Pt Overview and Major Events to Date:  1/14: Admitted for AKI in setting of hx renal transplant  Assessment and Plan: [name] [name] is a 49 y.o. male presenting with back and neck pain, noted to have AKI with Creatinine of 3.18. PMH is significant for CKD s/p renal transplant, HTN, DM type 2, COPD, anxiety, and hyperlipidemia.  # AKI with h/o CKD s/p renal transplant: History of renal transplant in 2009. Renal failure caused by diabetic nephropathy. Baseline Creatinine 2.1. AKI likely due to dehydration vs post renal obstruction given recent urological procedure and mild hydronephroses noted on recent renal US (transplanted kidney in RLQ). Improving, - Creatinine 3.18 at admission. BUN 61. GFR 25. - currently Cr 2.98   - Renal US with minimal hydronephrosis of renal transplant. - Avoid nephrotoxic medications - Continue Cellcept, Prednisone, and Prograf - Holding Lasix 80mg  BID - renal consulted; appreciate recs  # Anemia: Hemoglobin 9.3 on admission. No evidence of bleeding. Prior to admission Hbgs 12-13. This could indicate worsening renal function. Stable.  - FOBT pending - iron studies low - patient receiving ferric gluconate - Continue to monitor and trend Hbg -appreciate renal recs  #Urinary retention: Patient with recent urologic procedure for urethral stricture. Urologic procedure performed at [place] due to some diabetic uropathy.  -Continue to monitor -most recent post-coid residual with 500cc  -scheduled voids for patient -urology consulted; appreciate recs - no need for foley currently; continue PVR scans daily. -mild hydronephrosis noted on renal US -urine culture grew enterococcus  species >100,00 colonies. Levaquin started. Patient allergic to penicillins.  # Back pain: MRI lumbar spine- disc protrusions L4-L5 and L5-S1 with stenosis and annular tear.  - Holding home muscle relaxers due to AKI - Morphine 1mg  q3hrs PRN severe pain, Norco prn for moderate pain, Acetaminophen prn for mild pain -patient found to have decreased rectal tone yesterday -PT/OT consulted - SNF recommended; patient amicable to SNF once pain controlled -neurosurgery consulted; appreciate recs - recommended IR epidural. Order and consult placed. Will not be able to do until Monday.  # HTN: BP last 24/hr (115-135/49-60). Home medication includes Clonodine 0.1mg  PRN elevated BP. BPs stable. - Clonidine held - Hydralazine PRN SBP >180 or DBP >110 - Continue to monitor  # DM: No more episodes of hypoglycemia in last 24hrs. CBGs ranging 100-280. Home medications include Lantus 90U TID and Novolog 50U five times a day. -A1c 5.9  -reticulocyte normal - patient not responding to anemia appropriately - changed regimen to Lantus 40U QHS and mod SSI - monitor CBGs  # Anxiety: Home medication includes Klonopin 0.5mg  TID PRN anxiety - Valium 2.5mg  q8hrs scheduled - Continue Klonipin prn  FEN/GI: Heart Healthy/Carb Modified Diet Prophylaxis: SCDs (Allergic to Pork Derived Products with Anaphylaxis)  Disposition: Continue current management; pending improvement in pain and SNF bed.  Subjective:  Patient endorsing worsening back pain this morning. He states that the pain medication is not working for him. He states that he can not feel his feet. He wants to know what can be done for his back. He is unwilling to move due to severe pain.   Objective: Temp:  [98 F (36.7 C)-98.6 F (37 C)] 98.6 F (37 C) (01/16 0610) Pulse Rate:  [56-83] 83 (01/16 0610)  Resp:  [17-21] 21 (01/16 0610) BP: (136-145)/(58-61) 141/61 mmHg (01/16 0610) SpO2:  [87 %-96 %] 96 % (01/16 0610) Weight:  [261 [name] (118.389 kg)] 261  [name] (118.389 kg) (01/15 2024) Physical Exam: General: alert, NAD Cardiovascular: RRR, S1 and S2 noted. No murmurs/rubs/gallops.  Respiratory: Clear to auscultation bilaterally. No wheezes/rales/rhonci. Abdomen: Bowel sounds noted. Soft and nondistended. No tenderness or masses with palpation. No CVA tenderness noted. Muskuloskeletal: No edema noted.  Skin: No rashes noted. WWP. Distal pulses intact Neuro: limited exam due to patient compliance. Strength and sensation intact.  Laboratory: Results for orders placed or performed during the hospital encounter of 10/24/14 (from the past 24 hour(s))  Occult blood card to lab, stool     Status: None   Collection Time: 10/26/14 12:38 PM  Result Value Ref Range   Fecal Occult Bld NEGATIVE NEGATIVE  Glucose, capillary     Status: Abnormal   Collection Time: 10/26/14 12:53 PM  Result Value Ref Range   Glucose-Capillary 146 (H) 70 - 99 mg/dL  Glucose, capillary     Status: Abnormal   Collection Time: 10/26/14  5:33 PM  Result Value Ref Range   Glucose-Capillary 279 (H) 70 - 99 mg/dL  Glucose, capillary     Status: Abnormal   Collection Time: 10/26/14  8:09 PM  Result Value Ref Range   Glucose-Capillary 236 (H) 70 - 99 mg/dL  Glucose, capillary     Status: Abnormal   Collection Time: 10/27/14 12:28 AM  Result Value Ref Range   Glucose-Capillary 198 (H) 70 - 99 mg/dL  Glucose, capillary     Status: Abnormal   Collection Time: 10/27/14  4:03 AM  Result Value Ref Range   Glucose-Capillary 200 (H) 70 - 99 mg/dL  Basic metabolic panel     Status: Abnormal   Collection Time: 10/27/14  5:31 AM  Result Value Ref Range   Sodium 139 135 - 145 mmol/L   Potassium 5.0 3.5 - 5.1 mmol/L   Chloride 106 96 - 112 mEq/L   CO2 25 19 - 32 mmol/L   Glucose, Bld 206 (H) 70 - 99 mg/dL   BUN 73 (H) 6 - 23 mg/dL   Creatinine, Ser 2.89 (H) 0.50 - 1.35 mg/dL   Calcium 8.3 (L) 8.4 - 10.5 mg/dL   GFR calc non Af Amer 24 (L) >90 mL/min   GFR calc Af Amer 28 (L)  >90 mL/min   Anion gap 8 5 - 15  CBC     Status: Abnormal   Collection Time: 10/27/14  5:31 AM  Result Value Ref Range   WBC 8.1 4.0 - 10.5 K/uL   RBC 3.93 (L) 4.22 - 5.81 MIL/uL   Hemoglobin 9.5 (L) 13.0 - 17.0 g/dL   HCT 32.1 (L) 39.0 - 52.0 %   MCV 81.7 78.0 - 100.0 fL   MCH 24.2 (L) 26.0 - 34.0 pg   MCHC 29.6 (L) 30.0 - 36.0 g/dL   RDW 15.6 (H) 11.5 - 15.5 %   Platelets 334 150 - 400 K/uL  Glucose, capillary     Status: Abnormal   Collection Time: 10/27/14  7:28 AM  Result Value Ref Range   Glucose-Capillary 186 (H) 70 - 99 mg/dL    Imaging/Diagnostic Tests: Mr Lumbar Spine Wo Contrast  10/24/2014   CLINICAL DATA:  Low back pain rated 10/10. Severe burning pain in the right lateral aspect of his neck.  EXAM: MRI LUMBAR SPINE WITHOUT CONTRAST  TECHNIQUE: Multiplanar, multisequence MR imaging of  the lumbar spine was performed. No intravenous contrast was administered.  COMPARISON:  CT of the abdomen and pelvis 12/30/2009  FINDINGS: Normal signal is present in the conus medullaris which terminates at L1, within normal limits. T1 marrow signal is diffusely depressed. Template edematous endplate changes are present at L5-S1. No other focal signal changes are present. Vertebral body heights and alignment are normal.  Limited imaging of the abdomen is unremarkable.  The disc levels at L3-4 and above scratch the disc levels at L2-3 and above are normal.  L3-4: Mild leftward disc bulging is present without significant stenosis.  L4-5: A leftward disc protrusion is present. There is an annular tear. Mild subarticular narrowing is worse left than right. Mild foraminal narrowing is present bilaterally as well.  L5-S1: A leftward disc protrusion is present. Prominent epidural fat is evident. The disc protrudes anteriorly as well. The combination results in mild left subarticular narrowing. The foramina are patent.  IMPRESSION: 1. Leftward disc protrusion and prominent epidural fat at L5-S1 with mild  left subarticular stenosis. 2. Leftward disc protrusion and annular tear at L4-5 with mild subarticular stenosis bilaterally, left greater than right. 3. Mild foraminal narrowing bilaterally at L4-5 as well.   Electronically Signed   By: Lawrence Santiago M.D.   On: 10/24/2014 16:11   Katheren Shams, DO 10/27/2014, 9:42 AM PGY-1, [place] Intern pager: [phone], text pages welcome

## 2014-10-28 ENCOUNTER — Inpatient Hospital Stay (HOSPITAL_COMMUNITY): Payer: Medicare Other

## 2014-10-28 DIAGNOSIS — M549 Dorsalgia, unspecified: Secondary | ICD-10-CM | POA: Insufficient documentation

## 2014-10-28 DIAGNOSIS — R339 Retention of urine, unspecified: Secondary | ICD-10-CM | POA: Insufficient documentation

## 2014-10-28 LAB — BASIC METABOLIC PANEL
Anion gap: 10 (ref 5–15)
BUN: 71 mg/dL — ABNORMAL HIGH (ref 6–23)
CO2: 24 mmol/L (ref 19–32)
Calcium: 8.6 mg/dL (ref 8.4–10.5)
Chloride: 103 mEq/L (ref 96–112)
Creatinine, Ser: 2.64 mg/dL — ABNORMAL HIGH (ref 0.50–1.35)
GFR calc Af Amer: 31 mL/min — ABNORMAL LOW (ref >90)
GFR calc non Af Amer: 27 mL/min — ABNORMAL LOW (ref >90)
Glucose, Bld: 168 mg/dL — ABNORMAL HIGH (ref 70–99)
Potassium: 4.3 mmol/L (ref 3.5–5.1)
Sodium: 137 mmol/L (ref 135–145)

## 2014-10-28 LAB — GLUCOSE, CAPILLARY
Glucose-Capillary: 178 mg/dL — ABNORMAL HIGH (ref 70–99)
Glucose-Capillary: 178 mg/dL — ABNORMAL HIGH (ref 70–99)
Glucose-Capillary: 76 mg/dL (ref 70–99)
Glucose-Capillary: 96 mg/dL (ref 70–99)

## 2014-10-28 LAB — CBC
HCT: 35 % — ABNORMAL LOW (ref 39.0–52.0)
Hemoglobin: 10.4 g/dL — ABNORMAL LOW (ref 13.0–17.0)
MCH: 24 pg — ABNORMAL LOW (ref 26.0–34.0)
MCHC: 29.7 g/dL — ABNORMAL LOW (ref 30.0–36.0)
MCV: 80.8 fL (ref 78.0–100.0)
Platelets: 310 10*3/uL (ref 150–400)
RBC: 4.33 MIL/uL (ref 4.22–5.81)
RDW: 15.6 % — ABNORMAL HIGH (ref 11.5–15.5)
WBC: 8.2 10*3/uL (ref 4.0–10.5)

## 2014-10-28 NOTE — Progress Notes (Signed)
Patient ID: [name] [name], male   DOB: December 30, 1965, 49 y.o.   MRN: [id number]    Subjective: [name] [name] continues to void comfortably and his Cr is falling.  He remains on Levaquin for the enterococcal UTI.  He is to have an epidural injection tomorrow which will hopefully help with his pain.  ROS:  Review of Systems  Constitutional: Negative for fever.  Gastrointestinal: Negative for abdominal pain.  Musculoskeletal: Positive for back pain.    Anti-infectives: Anti-infectives    Start     Dose/Rate Route Frequency Ordered Stop   10/26/14 2200  levofloxacin (LEVAQUIN) tablet 250 mg     250 mg Oral Daily 10/26/14 2148 10/31/14 0959      Current Facility-Administered Medications  Medication Dose Route Frequency Provider Last Rate Last Dose  . 0.9 %  sodium chloride infusion  250 mL Intravenous PRN [place] N Rumley, DO      . acetaminophen (TYLENOL) tablet 650 mg  650 mg Oral Q6H PRN Lorna Few, DO       Or  . acetaminophen (TYLENOL) suppository 650 mg  650 mg Rectal Q6H PRN Lorna Few, DO      . aspirin tablet 325 mg  325 mg Oral Daily [place] N Rumley, DO   325 mg at 10/28/14 1054  . clonazePAM (KLONOPIN) tablet 0.5 mg  0.5 mg Oral TID PRN Lorna Few, DO   0.5 mg at 10/27/14 0917  . diazepam (VALIUM) injection 5 mg  5 mg Intravenous 3 times per day Lupita Dawn, MD   5 mg at 10/28/14 0552  . feeding supplement (ENSURE COMPLETE) (ENSURE COMPLETE) liquid 237 mL  237 mL Oral BID BM Zigmund Gottron, MD   237 mL at 10/28/14 1055  . ferric gluconate (NULECIT) 125 mg in sodium chloride 0.9 % 100 mL IVPB  125 mg Intravenous Daily Sherril Croon, MD   125 mg at 10/27/14 1239  . hydrALAZINE (APRESOLINE) injection 10 mg  10 mg Intravenous Q6H PRN [employer], DO      . HYDROcodone-acetaminophen (NORCO/VICODIN) 5-325 MG per tablet 2 tablet  2 tablet Oral Q6H PRN Katheren Shams, DO   2 tablet at 10/26/14 1232  . insulin aspart (novoLOG) injection 0-15 Units  0-15 Units  Subcutaneous TID WC Katheren Shams, DO   3 Units at 10/27/14 1725  . insulin glargine (LANTUS) injection 40 Units  40 Units Subcutaneous Daily Coral Spikes, DO   40 Units at 10/28/14 1054  . levofloxacin (LEVAQUIN) tablet 250 mg  250 mg Oral Daily Katheren Shams, DO   250 mg at 10/28/14 1054  . morphine 2 MG/ML injection 2 mg  2 mg Intravenous Q3H PRN Leone Brand, MD   2 mg at 10/28/14 1043  . mycophenolate (CELLCEPT) capsule 750 mg  750 mg Oral BID [place] N Rumley, DO   750 mg at 10/28/14 1053  . ondansetron (ZOFRAN) tablet 4 mg  4 mg Oral Q6H PRN [facility], DO   4 mg at 10/26/14 [id number]   Or  . ondansetron (ZOFRAN) injection 4 mg  4 mg Intravenous Q6H PRN [place] N Rumley, DO      . pantoprazole (PROTONIX) EC tablet 40 mg  40 mg Oral Daily [place] N Rumley, DO   40 mg at 10/28/14 1055  . predniSONE (DELTASONE) tablet 5 mg  5 mg Oral Daily  N Rumley, DO   5 mg at 10/28/14 1054  . rosuvastatin (CRESTOR)  tablet 20 mg  20 mg Oral QHS [place] N Rumley, DO   20 mg at 10/27/14 2226  . sodium chloride 0.9 % injection 3 mL  3 mL Intravenous Q12H [place] N Rumley, DO   3 mL at 10/28/14 1046  . sodium chloride 0.9 % injection 3 mL  3 mL Intravenous PRN  N Rumley, DO      . tacrolimus (PROGRAF) capsule 1 mg  1 mg Oral BID [facility], DO   1 mg at 10/28/14 1053     Objective: Vital signs in last 24 hours: Temp:  [97.6 F (36.4 C)-98.4 F (36.9 C)] 98.1 F (36.7 C) (01/17 0940) Pulse Rate:  [86-94] 87 (01/17 0940) Resp:  [17-20] 20 (01/17 0940) BP: (149-168)/(60-87) 149/65 mmHg (01/17 0940) SpO2:  [93 %-98 %] 94 % (01/17 0940) Weight:  [118.842 kg (262 lb)] 118.842 kg (262 lb) (01/16 1940)  Intake/Output from previous day: 01/16 0701 - 01/17 0700 In: 1060 [P.O.:1060] Out: 1875 [Urine:1875] Intake/Output this shift: Total I/O In: 243 [P.O.:240; I.V.:3] Out: 0    Physical Exam  Constitutional: He is well-developed, well-nourished, and in no distress.  Vitals  reviewed.   Lab Results:   Recent Labs  10/27/14 0531 10/28/14 0540  WBC 8.1 8.2  HGB 9.5* 10.4*  HCT 32.1* 35.0*  PLT 334 310   BMET  Recent Labs  10/27/14 0531 10/28/14 0540  NA 139 137  K 5.0 4.3  CL 106 103  CO2 25 24  GLUCOSE 206* 168*  BUN 73* 71*  CREATININE 2.89* 2.64*  CALCIUM 8.3* 8.6   PT/INR No results for input(s): LABPROT, INR in the last 72 hours. ABG No results for input(s): PHART, HCO3 in the last 72 hours.  Invalid input(s): PCO2, PO2  Studies/Results: No results found.   Assessment: He continues to void without complaints and his Cr is falling.   I don't see a recent PVR.   Plan: I will sign off.  Please reconsult as needed.  He should f/u with me as an outpatient for a urine and PVR recheck.      LOS: 4 days    [name] [name] J 10/28/2014

## 2014-10-28 NOTE — Progress Notes (Signed)
Bladder scan performed this morning.  Patient voided 450 cc.  Post void residual per scan is 772 cc.  Patient did not complain of any fullness or pain regarding residual.  MD on call notified.  Will continue to monitor.

## 2014-10-29 ENCOUNTER — Inpatient Hospital Stay (HOSPITAL_COMMUNITY): Payer: Medicare Other

## 2014-10-29 DIAGNOSIS — M5412 Radiculopathy, cervical region: Secondary | ICD-10-CM

## 2014-10-29 DIAGNOSIS — M549 Dorsalgia, unspecified: Secondary | ICD-10-CM

## 2014-10-29 LAB — BASIC METABOLIC PANEL
Anion gap: 8 (ref 5–15)
BUN: 72 mg/dL — ABNORMAL HIGH (ref 6–23)
CO2: 25 mmol/L (ref 19–32)
Calcium: 8.5 mg/dL (ref 8.4–10.5)
Chloride: 104 mEq/L (ref 96–112)
Creatinine, Ser: 2.65 mg/dL — ABNORMAL HIGH (ref 0.50–1.35)
GFR calc Af Amer: 31 mL/min — ABNORMAL LOW (ref >90)
GFR calc non Af Amer: 26 mL/min — ABNORMAL LOW (ref >90)
Glucose, Bld: 116 mg/dL — ABNORMAL HIGH (ref 70–99)
Potassium: 4.3 mmol/L (ref 3.5–5.1)
Sodium: 137 mmol/L (ref 135–145)

## 2014-10-29 LAB — CBC
HCT: 33.4 % — ABNORMAL LOW (ref 39.0–52.0)
Hemoglobin: 9.9 g/dL — ABNORMAL LOW (ref 13.0–17.0)
MCH: 24.1 pg — ABNORMAL LOW (ref 26.0–34.0)
MCHC: 29.6 g/dL — ABNORMAL LOW (ref 30.0–36.0)
MCV: 81.5 fL (ref 78.0–100.0)
Platelets: 319 10*3/uL (ref 150–400)
RBC: 4.1 MIL/uL — ABNORMAL LOW (ref 4.22–5.81)
RDW: 15.9 % — ABNORMAL HIGH (ref 11.5–15.5)
WBC: 9.3 10*3/uL (ref 4.0–10.5)

## 2014-10-29 LAB — GLUCOSE, CAPILLARY
Glucose-Capillary: 118 mg/dL — ABNORMAL HIGH (ref 70–99)
Glucose-Capillary: 113 mg/dL — ABNORMAL HIGH (ref 70–99)
Glucose-Capillary: 173 mg/dL — ABNORMAL HIGH (ref 70–99)
Glucose-Capillary: 236 mg/dL — ABNORMAL HIGH (ref 70–99)

## 2014-10-29 LAB — TACROLIMUS LEVEL: Tacrolimus (FK506) - LabCorp: 11.9 ng/mL (ref 2.0–20.0)

## 2014-10-29 MED ORDER — LIDOCAINE HCL (PF) 1 % IJ SOLN
INTRAMUSCULAR | Status: AC
  Filled 2014-10-29: qty 5

## 2014-10-29 MED ORDER — METHYLPREDNISOLONE ACETATE 80 MG/ML IJ SUSP
INTRAMUSCULAR | Status: AC
  Administered 2014-10-29: 80 mg
  Filled 2014-10-29: qty 1

## 2014-10-29 MED ORDER — SENNOSIDES-DOCUSATE SODIUM 8.6-50 MG PO TABS
1.0000 | ORAL_TABLET | Freq: Two times a day (BID) | ORAL | Status: DC
  Administered 2014-10-29 – 2014-10-30 (×2): 1 via ORAL
  Filled 2014-10-29 (×3): qty 1

## 2014-10-29 MED ORDER — SODIUM CHLORIDE 0.9 % IJ SOLN
INTRAMUSCULAR | Status: AC
  Filled 2014-10-29: qty 10

## 2014-10-29 MED ORDER — IOHEXOL 180 MG/ML  SOLN
20.0000 mL | Freq: Once | INTRAMUSCULAR | Status: AC | PRN
  Administered 2014-10-29: 3 mL via EPIDURAL

## 2014-10-29 MED ORDER — METHYLPREDNISOLONE ACETATE 40 MG/ML IJ SUSP
INTRAMUSCULAR | Status: AC
  Administered 2014-10-29: 40 mg
  Filled 2014-10-29: qty 1

## 2014-10-29 MED ORDER — POLYETHYLENE GLYCOL 3350 17 G PO PACK: 17.0000 g | PACK | Freq: Two times a day (BID) | ORAL | Status: DC

## 2014-10-29 NOTE — Clinical Social Work Note (Signed)
CSW visited and spoke with patient again regarding discharge planning and skilled nursing facility placement. Patient continues to talk about his pain level and inability to do anything, including rehab. He is agreeable to CSW sending his information out to facilities however.  Cruzita Lipa Givens, MSW, LCSW Licensed Clinical Social Worker [place] [phone]

## 2014-10-29 NOTE — Progress Notes (Signed)
PT Cancellation Note  Patient Details Name: [name] [name] MRN: [id number] DOB: 1966/09/03   Cancelled Treatment:    Reason Eval/Treat Not Completed: Pain limiting ability to participate. Pt refusing any mobility with PT at this time. Will check back as schedule allows.    Rolinda Roan 10/29/2014, 12:01 PM   Rolinda Roan, PT, DPT Acute Rehabilitation Services Pager: [phone]

## 2014-10-29 NOTE — Progress Notes (Signed)
Family Medicine Teaching Service Daily Progress Note Intern Pager: [phone]  Patient name: [name] [name] Medical record number: [id number] Date of birth: 09/28/1966 Age: 49 y.o. Gender: male  Primary Care Provider: [name], MD Consultants: Nephro Code Status: Full  Pt Overview and Major Events to Date:  1/13: Admitted for AKI in setting of hx renal transplant 1/13: Renal US with minimal hydronephrosis of renal transplant. 1/18: IR epidural for pain relief  Assessment and Plan: [name] [name] is a 49 y.o. male presenting with back and neck pain, noted to have AKI with Creatinine of 3.18. PMH is significant for CKD s/p renal transplant, HTN, DM type 2, COPD, anxiety, and hyperlipidemia.  # AKI with h/o CKD s/p renal transplant: History of renal transplant in 2009. Renal failure caused by diabetic nephropathy. Baseline Creatinine 2.1. AKI likely due to dehydration vs post renal obstruction given recent urological procedure and mild hydronephroses noted on recent renal US (transplanted kidney in RLQ). Creatinine of 2.66 on admission with a nadir of 3.18. Creatine continuing to improve.  -Creatinine 2.65 - Avoid nephrotoxic medications - Continue Cellcept, Prednisone, and Prograf - Holding Lasix 80mg  BID - renal consulted; appreciate recs  # Anemia: Hemoglobin 9.3 on admission. No evidence of bleeding. Prior to admission Hbgs 12-13. This could indicate worsening renal function. Stable.  - FOBT pending - iron studies low - patient receiving ferric gluconate - Continue to monitor and trend Hbg -appreciate renal recs  #Urinary retention: Patient with recent urologic procedure for urethral stricture. Urologic procedure performed at [place] due to some diabetic uropathy. Mild hydronephrosis noted on renal US. Last PVR of 563cc on 1/18 in AM -Continue to monitor -scheduled voids for patient  -continue to encourage foley catheter -prn in and out cath ordered -urology  consulted; appreciate recs - signed off. Outpatient follow-up.  #UTI: urine culture grew enterococcus species >100,00 colonies. -Continue Levaquin x5 days (1/15- 1/20)  # Back pain: MRI lumbar spine- disc protrusions L4-L5 and L5-S1 with stenosis and annular tear. SNF recommended by PT. Last PSA for patient 03/2010([facility]) and was 1.22. - Holding home muscle relaxers due to AKI - Will transition to oral pain medications today - Norco as needed; pain should improve with epidural procedure -patient found to have decreased rectal tone  -neurosurgery consulted; appreciate recs - recommended IR epidural. Order and consult placed. Plan for today. - T-spine MRI (1/17) - unremarkable - will re-order PSA today -patient willing to go to SNF for PM&R - CSW consulted  # HTN: BP slightly elevated in last 24hr. Home medication includes Clonodine 0.1mg  PRN elevated BP. BPs stable. - Clonidine held - Hydralazine PRN SBP >180 or DBP >110 - Continue to monitor  # DM: CBGs ranging 96-180s. Received 3 units of Novolog in last 24 hours. - A1c 5.9  - Continue Lantus 40U QHS and mod SSI - monitor CBGs  # Anxiety: Home medication includes Klonopin 0.5mg  TID PRN anxiety - Valium 5mg  q8hrs scheduled - Continue Klonipin prn  FEN/GI: Heart Healthy/Carb Modified Diet, NPO for procedure, KVO Prophylaxis: SCDs (Allergic to Pork Derived Products with Anaphylaxis)  Disposition: Continue current management; pending improvement in pain and SNF bed.  Subjective:  Patient's pain is persistent. Unwilling to move due to pain. No urge to urinate. Patient looking forwarded to procedure.  Objective: Temp:  [97.4 F (36.3 C)-98.6 F (37 C)] 98.6 F (37 C) (01/18 0907) Pulse Rate:  [79-96] 89 (01/18 0907) Resp:  [17-21] 17 (01/18 0907) BP: (112-162)/(40-78) 152/66 mmHg (01/18  [id number]) SpO2:  [92 %-99 %] 94 % (01/18 0907) Weight:  [262 lb (118.842 kg)] 262 lb (118.842 kg) (01/17 2008)  Physical Exam: General:  alert, NAD Cardiovascular: RRR. No murmurs/rubs/gallops.  Respiratory: Clear to auscultation bilaterally anteriorly Abdomen: Bowel sounds noted. Soft and nondistended. No tenderness or masses with palpation. No CVA tenderness noted. Muskuloskeletal: No edema noted.  Skin: No rashes noted. WWP Neuro: limited exam due to patient compliance and pain. Unable to lift L. Leg. Has FROM of ankles bilaterally. Sensation intact.  Laboratory: Results for orders placed or performed during the hospital encounter of 10/24/14 (from the past 24 hour(s))  Glucose, capillary     Status: None   Collection Time: 10/28/14  4:33 PM  Result Value Ref Range   Glucose-Capillary 76 70 - 99 mg/dL   Comment 1 Notify RN    Comment 2 Documented in Chart   Glucose, capillary     Status: None   Collection Time: 10/28/14  8:12 PM  Result Value Ref Range   Glucose-Capillary 96 70 - 99 mg/dL  Glucose, capillary     Status: Abnormal   Collection Time: 10/29/14  7:51 AM  Result Value Ref Range   Glucose-Capillary 118 (H) 70 - 99 mg/dL  CBC     Status: Abnormal   Collection Time: 10/29/14  9:05 AM  Result Value Ref Range   WBC 9.3 4.0 - 10.5 K/uL   RBC 4.10 (L) 4.22 - 5.81 MIL/uL   Hemoglobin 9.9 (L) 13.0 - 17.0 g/dL   HCT 33.4 (L) 39.0 - 52.0 %   MCV 81.5 78.0 - 100.0 fL   MCH 24.1 (L) 26.0 - 34.0 pg   MCHC 29.6 (L) 30.0 - 36.0 g/dL   RDW 15.9 (H) 11.5 - 15.5 %   Platelets 319 150 - 400 K/uL  Basic metabolic panel     Status: Abnormal   Collection Time: 10/29/14  9:05 AM  Result Value Ref Range   Sodium 137 135 - 145 mmol/L   Potassium 4.3 3.5 - 5.1 mmol/L   Chloride 104 96 - 112 mEq/L   CO2 25 19 - 32 mmol/L   Glucose, Bld 116 (H) 70 - 99 mg/dL   BUN 72 (H) 6 - 23 mg/dL   Creatinine, Ser 2.65 (H) 0.50 - 1.35 mg/dL   Calcium 8.5 8.4 - 10.5 mg/dL   GFR calc non Af Amer 26 (L) >90 mL/min   GFR calc Af Amer 31 (L) >90 mL/min   Anion gap 8 5 - 15  Glucose, capillary     Status: Abnormal   Collection Time:  10/29/14 11:32 AM  Result Value Ref Range   Glucose-Capillary 113 (H) 70 - 99 mg/dL    Imaging/Diagnostic Tests: Mr Thoracic Spine Wo Contrast  10/28/2014   CLINICAL DATA:  New onset back pain without history of injury. History of renal transplant.  EXAM: MRI THORACIC SPINE WITHOUT CONTRAST  TECHNIQUE: Multiplanar, multisequence MR imaging of the thoracic spine was performed. No intravenous contrast was administered.  COMPARISON:  CT abdomen and pelvis 12/30/2009. Lumbar spine MRI 10/24/2014.  FINDINGS: As seen on the patient's lumbar spine MRI, marrow signal is diffusely depressed on T1 weighted imaging without T2 hyperintensity. Vertebral body height and alignment are maintained. The thoracic cord demonstrates normal signal throughout. Intervertebral disc space height is maintained at all levels. The central canal and foramina appear patent at all levels.  Paraspinous structures demonstrate a small lesion in the left adrenal gland consistent with a benign  adenoma as seen on the prior CT scan. The patient also appears to have very small bilateral pleural effusions, greater on the left.  IMPRESSION: Negative for fracture or spondylosis to explain the patient's back pain.  Diffusely decreased marrow signal on T1 weighted imaging is nonspecific and may be due to marrow reactivation in this renal transplant patient rather than marrow infiltrative process.   Electronically Signed   By: Inge Rise M.D.   On: 10/28/2014 15:38   Ir Epidurography  10/29/2014   CLINICAL DATA:  Low back pain. No significant radicular component. No previous lumbar surgery. Disc protrusions L4-5, L5-S1 on recent MR.  FLUOROSCOPY TIME:  12 seconds  PROCEDURE: LUMBAR EPIDURAL INJECTION:  An interlaminar approach was performed on the left at L4-5. The overlying skin was cleansed and anesthetized. A 20 gauge spinal needle was advanced using loss-of-resistance technique. Injection of 2cc of Omnipaque 180 confirmed epidural placement.  There was no evidence for intravascular or intrathecal spread of contrast.  I then injected 120 mg of Depo-Medrol and 83ml of 1% lidocaine. The patient tolerated the procedure without evidence for complication. The patient was observed for 20 minutes returned to the floor in stable neurologic condition.  IMPRESSION: Technically successful interlaminar epidural steroid injection on the left at L4-5.   Electronically Signed   By: Arne Cleveland M.D.   On: 10/29/2014 13:31    Katheren Shams, DO 10/29/2014, 2:04 PM PGY-1, [place] Intern pager: [phone], text pages welcome

## 2014-10-29 NOTE — Consult Note (Signed)
Chief Complaint: Chief Complaint  Patient presents with  . Back Pain  . Shoulder Pain  . Neck Pain   Back  Pain x 1 week---radiates to Rt leg  Referring Physician(s): Dr Andria Frames  History of Present Illness: [name] [name] is a 49 y.o. male   Pt has had new onset back pain without injury x 1 week States pain in middle of low back to Rt leg---to foot Almost any movement causes pain MRI reveals disc protrusion L4-5 and L5-S1 Has been seen by Dr Georgetta Haber surgical intervention needed Rec: L epidural steroid injection I have seen and examined pt Discussed case with Dr Vernard Gambles  Pt has had Left leg immobility since renal transplant 2009   Past Medical History  Diagnosis Date  . Diabetes mellitus   . Hypertension   . Leg pain 02/05/11    with walking  . Weight gain   . Shortness of breath     when lying flat  . Diminished eyesight   . Hearing difficulty   . Chronic kidney disease     Dia;ysis MWF  . Anemia   . Nervousness(799.21)   . COPD (chronic obstructive pulmonary disease)   . CHF (congestive heart failure)   . Posttraumatic stress disorder   . Allergic rhinitis   . Dyslipidemia   . Morbid obesity   . Diabetic peripheral neuropathy   . Gait disorder   . Obstructive sleep apnea   . Bilateral carpal tunnel syndrome   . Anxiety disorder   . Gastroparesis   . Gout   . Diabetic retinopathy   . Renal transplant, status post 04/05/2014  . Incomplete bladder emptying   . Urethral stricture     Past Surgical History  Procedure Laterality Date  . Kidney transplant  2009  . Cataract extraction w/ intraocular lens  implant, bilateral Bilateral   . Av fistula placement Bilateral   . Arteriovenous graft placement Bilateral "several"  . Cystoscopy w/ internal urethrotomy      Allergies: Penicillins; Pork-derived products; and Ace inhibitors  Medications: Prior to Admission medications   Medication Sig Start Date End Date Taking? Authorizing Provider    allopurinol (ZYLOPRIM) 100 MG tablet Take 100 mg by mouth daily. Take with the 300mg  tablet to make a total of 400mg  daily per patient   Yes Historical Provider, MD  allopurinol (ZYLOPRIM) 300 MG tablet Take 300 mg by mouth daily. Take with the 100mg  tablet to make a total dose of 400mg  a day per patient   Yes Historical Provider, MD  aspirin 325 MG tablet Take 325 mg by mouth daily.    Yes Historical Provider, MD  calcitRIOL (ROCALTROL) 0.5 MCG capsule Take 0.5 mcg by mouth at bedtime.    Yes Historical Provider, MD  clonazePAM (KLONOPIN) 0.5 MG tablet Take 0.5 mg by mouth 3 (three) times daily as needed for anxiety.    Yes Historical Provider, MD  cloNIDine (CATAPRES) 0.1 MG tablet Take 0.1 mg by mouth daily as needed. Take only if blood pressure is high   Yes Historical Provider, MD  cyclobenzaprine (FLEXERIL) 10 MG tablet Take 1 tablet (10 mg total) by mouth 2 (two) times daily as needed for muscle spasms. 10/21/14  Yes Charlesetta Shanks, MD  furosemide (LASIX) 40 MG tablet Take 80 mg by mouth 2 (two) times daily.    Yes Historical Provider, MD  HYDROcodone-acetaminophen (NORCO/VICODIN) 5-325 MG per tablet Take 1-2 tablets by mouth every 6 (six) hours as needed for moderate pain or severe  pain. 10/21/14  Yes Charlesetta Shanks, MD  insulin aspart (NOVOLOG) 100 UNIT/ML injection Inject 50 Units into the skin 5 (five) times daily.   Yes Historical Provider, MD  insulin glargine (LANTUS) 100 UNIT/ML injection Inject 90 Units into the skin 3 (three) times daily.     Yes Historical Provider, MD  mycophenolate (CELLCEPT) 250 MG capsule Take 750 mg by mouth 2 (two) times daily.    Yes Historical Provider, MD  omeprazole (PRILOSEC) 20 MG capsule Take 20 mg by mouth daily.   Yes Historical Provider, MD  potassium chloride (MICRO-K) 10 MEQ CR capsule Take 20 mEq by mouth 2 (two) times daily.    Yes Historical Provider, MD  predniSONE (DELTASONE) 5 MG tablet Take 5 mg by mouth daily.     Yes Historical Provider, MD   pregabalin (LYRICA) 50 MG capsule Take 1 capsule (50 mg total) by mouth at bedtime. 10/16/14  Yes Kathrynn Ducking, MD  rosuvastatin (CRESTOR) 20 MG tablet Take 20 mg by mouth at bedtime.    Yes Historical Provider, MD  sildenafil (VIAGRA) 100 MG tablet Take 50 mg by mouth daily as needed for erectile dysfunction.   Yes Historical Provider, MD  tacrolimus (PROGRAF) 0.5 MG capsule Take 1 mg by mouth 2 (two) times daily.    Yes Historical Provider, MD  CIALIS 5 MG tablet Take 5 mg by mouth daily as needed for erectile dysfunction.  06/05/14   Historical Provider, MD  HYDROcodone-acetaminophen (NORCO/VICODIN) 5-325 MG per tablet Take 1-2 tablets by mouth 2 (two) times daily. Patient not taking: Reported on 10/24/2014 02/07/14   Kathrynn Ducking, MD  insulin lispro (HUMALOG) 100 UNIT/ML injection Inject 50 Units into the skin. Take 50 units five times / day (with meals and snacks)    Historical Provider, MD  metolazone (ZAROXOLYN) 5 MG tablet Take 5 mg by mouth every Monday, Wednesday, and Friday.     Historical Provider, MD  sildenafil (VIAGRA) 100 MG tablet Take 0.5 tablets (50 mg total) by mouth daily as needed for erectile dysfunction. Patient not taking: Reported on 10/24/2014 07/20/11 08/19/11  Burnell Blanks, MD    Family History  Problem Relation Age of Onset  . Hypertension Mother   . Diabetes Father     History   Social History  . Marital Status: Divorced    Spouse Name: N/A    Number of Children: 0  . Years of Education: College    Occupational History  .     Social History Main Topics  . Smoking status: Never Smoker   . Smokeless tobacco: Never Used  . Alcohol Use: No  . Drug Use: No  . Sexual Activity: None   Other Topics Concern  . None   Social History Narrative   Patient is separated.   Patient does not have any children.   Patient is right-handed   Patient is currently in college working on his Ph.D.           Review of Systems: A 12 point ROS  discussed and pertinent positives are indicated in the HPI above.  All other systems are negative.  Review of Systems  Constitutional: Positive for activity change, appetite change and fatigue. Negative for fever.  Respiratory: Negative for cough and shortness of breath.   Gastrointestinal: Negative for abdominal pain.  Musculoskeletal: Positive for back pain, gait problem and neck pain.  Neurological: Positive for weakness.  Psychiatric/Behavioral: Negative for behavioral problems and confusion.    Vital Signs:  BP 129/40 mmHg  Pulse 87  Temp(Src) 98.3 F (36.8 C) (Oral)  Resp 18  Ht 6' (1.829 m)  Wt 118.842 kg (262 lb)  BMI 35.53 kg/m2  SpO2 92%  Physical Exam  Constitutional: He is oriented to person, place, and time. He appears well-nourished.  Cardiovascular: Normal rate and regular rhythm.   Pulmonary/Chest: Effort normal and breath sounds normal.  Abdominal: Soft. Bowel sounds are normal.  Musculoskeletal:  Uses B arms without problem Unable to use L lower extr since renal tx 2009 +back pain Use of Rt leg is minimal---produces pain immediately with most movements  Neurological: He is alert and oriented to person, place, and time.  Skin: Skin is warm and dry.  Psychiatric: He has a normal mood and affect. His behavior is normal. Judgment and thought content normal.  Vitals reviewed.   Imaging: Mr Thoracic Spine Wo Contrast  10/28/2014   CLINICAL DATA:  New onset back pain without history of injury. History of renal transplant.  EXAM: MRI THORACIC SPINE WITHOUT CONTRAST  TECHNIQUE: Multiplanar, multisequence MR imaging of the thoracic spine was performed. No intravenous contrast was administered.  COMPARISON:  CT abdomen and pelvis 12/30/2009. Lumbar spine MRI 10/24/2014.  FINDINGS: As seen on the patient's lumbar spine MRI, marrow signal is diffusely depressed on T1 weighted imaging without T2 hyperintensity. Vertebral body height and alignment are maintained. The  thoracic cord demonstrates normal signal throughout. Intervertebral disc space height is maintained at all levels. The central canal and foramina appear patent at all levels.  Paraspinous structures demonstrate a small lesion in the left adrenal gland consistent with a benign adenoma as seen on the prior CT scan. The patient also appears to have very small bilateral pleural effusions, greater on the left.  IMPRESSION: Negative for fracture or spondylosis to explain the patient's back pain.  Diffusely decreased marrow signal on T1 weighted imaging is nonspecific and may be due to marrow reactivation in this renal transplant patient rather than marrow infiltrative process.   Electronically Signed   By: Inge Rise M.D.   On: 10/28/2014 15:38   Mr Lumbar Spine Wo Contrast  10/24/2014   CLINICAL DATA:  Low back pain rated 10/10. Severe burning pain in the right lateral aspect of his neck.  EXAM: MRI LUMBAR SPINE WITHOUT CONTRAST  TECHNIQUE: Multiplanar, multisequence MR imaging of the lumbar spine was performed. No intravenous contrast was administered.  COMPARISON:  CT of the abdomen and pelvis 12/30/2009  FINDINGS: Normal signal is present in the conus medullaris which terminates at L1, within normal limits. T1 marrow signal is diffusely depressed. Template edematous endplate changes are present at L5-S1. No other focal signal changes are present. Vertebral body heights and alignment are normal.  Limited imaging of the abdomen is unremarkable.  The disc levels at L3-4 and above scratch the disc levels at L2-3 and above are normal.  L3-4: Mild leftward disc bulging is present without significant stenosis.  L4-5: A leftward disc protrusion is present. There is an annular tear. Mild subarticular narrowing is worse left than right. Mild foraminal narrowing is present bilaterally as well.  L5-S1: A leftward disc protrusion is present. Prominent epidural fat is evident. The disc protrudes anteriorly as well. The  combination results in mild left subarticular narrowing. The foramina are patent.  IMPRESSION: 1. Leftward disc protrusion and prominent epidural fat at L5-S1 with mild left subarticular stenosis. 2. Leftward disc protrusion and annular tear at L4-5 with mild subarticular stenosis bilaterally, left greater than  right. 3. Mild foraminal narrowing bilaterally at L4-5 as well.   Electronically Signed   By: Lawrence Santiago M.D.   On: 10/24/2014 16:11   US Renal  10/25/2014   CLINICAL DATA:  Evaluate apparent lesion arising from the native right kidney. Personal history of renal transplant. Initial encounter.  EXAM: RENAL/URINARY TRACT ULTRASOUND COMPLETE  COMPARISON:  CT of the abdomen and pelvis performed 12/30/2009  FINDINGS: Right Kidney:  Length: 10.7 cm. Diffusely increased parenchymal echogenicity noted. Marked cortical thinning is seen. A 4.2 x 3.7 x 3.3 cm cyst is noted arising at the interpole region of the right kidney, simple in appearance. No hydronephrosis visualized.  Left Kidney:  Length: 9.1 cm. Diffusely increased parenchymal echogenicity noted. Marked cortical thinning is seen. No mass or hydronephrosis visualized.  Bladder:  Significantly distended and grossly unremarkable in appearance.  IMPRESSION: 1. 4.2 cm simple cyst at the interpole region of the right kidney. No suspicious masses seen. 2. Diffusely increased parenchymal echogenicity and marked bilateral native renal cortical thinning, compatible with the patient's known chronic native renal failure. The transplant kidney at the right iliac fossa is not assessed on this study. Per discussion with the clinical team, the only clinical question is in regard to the native right renal cyst, and thus assessment of the transplant kidney is not currently requested. 3. The bladder is significantly distended but otherwise grossly unremarkable. The patient does not describe any urinary urgency at this time; would consider clinical correlation for urinary  retention.   Electronically Signed   By: Garald Balding M.D.   On: 10/25/2014 07:19    Labs:  CBC:  Recent Labs  10/25/14 0546 10/26/14 0736 10/27/14 0531 10/28/14 0540  WBC 7.5 8.1 8.1 8.2  HGB 9.2* 9.4* 9.5* 10.4*  HCT 30.3* 31.9* 32.1* 35.0*  PLT 293 312 334 310    COAGS: No results for input(s): INR, APTT in the last 8760 hours.  BMP:  Recent Labs  10/25/14 0430 10/26/14 0736 10/27/14 0531 10/28/14 0540  NA 140 138 139 137  K 4.1 4.4 5.0 4.3  CL 101 104 106 103  CO2 22 18* 25 24  GLUCOSE 189* 109* 206* 168*  BUN 61* 67* 73* 71*  CALCIUM 8.7 8.5 8.3* 8.6  CREATININE 3.09* 3.11* 2.89* 2.64*  GFRNONAA 22* 22* 24* 27*  GFRAA 25* 25* 28* 31*    LIVER FUNCTION TESTS:  Recent Labs  10/24/14 1045 10/25/14 0430  BILITOT 1.1  --   AST 15  --   ALT 12  --   ALKPHOS 88  --   PROT 6.3  --   ALBUMIN 2.8* 2.7*    TUMOR MARKERS: No results for input(s): AFPTM, CEA, CA199, CHROMGRNA in the last 8760 hours.  Assessment and Plan:  Middle low back pain x 1 week Pain radiates to Rt low extr Scheduled now for L ESI in IR today Pt has been npo in case need conscious sedation Pt aware of procedure benefits and risks and agreeable to proceed Consent signed andin chart   Thank you for this interesting consult.  I greatly enjoyed meeting Brashaun Saltzman and look forward to participating in their care.    I spent a total of 20 minutes face to face in clinical consultation, greater than 50% of which was counseling/coordinating care for L epidural steroid injection  Signed: Hiroko Tregre A 10/29/2014, 9:01 AM

## 2014-10-29 NOTE — Progress Notes (Signed)
As requested by MD in IR, morphine 2mg  IV and klonopin 0.5mg  PO given prior to pt going down to IR.

## 2014-10-29 NOTE — Procedures (Signed)
Lumbar epidural injection L4/5 120mg  depomedrol, 59ml lido 1% No complication No blood loss. See complete dictation in [facility].

## 2014-10-30 DIAGNOSIS — N179 Acute kidney failure, unspecified: Secondary | ICD-10-CM | POA: Diagnosis not present

## 2014-10-30 DIAGNOSIS — M10311 Gout due to renal impairment, right shoulder: Secondary | ICD-10-CM | POA: Diagnosis not present

## 2014-10-30 DIAGNOSIS — M4627 Osteomyelitis of vertebra, lumbosacral region: Secondary | ICD-10-CM | POA: Diagnosis present

## 2014-10-30 DIAGNOSIS — I351 Nonrheumatic aortic (valve) insufficiency: Secondary | ICD-10-CM | POA: Diagnosis present

## 2014-10-30 DIAGNOSIS — M4626 Osteomyelitis of vertebra, lumbar region: Secondary | ICD-10-CM | POA: Diagnosis present

## 2014-10-30 DIAGNOSIS — E1142 Type 2 diabetes mellitus with diabetic polyneuropathy: Secondary | ICD-10-CM | POA: Diagnosis present

## 2014-10-30 DIAGNOSIS — Z9849 Cataract extraction status, unspecified eye: Secondary | ICD-10-CM | POA: Diagnosis not present

## 2014-10-30 DIAGNOSIS — F40240 Claustrophobia: Secondary | ICD-10-CM | POA: Diagnosis present

## 2014-10-30 DIAGNOSIS — M4649 Discitis, unspecified, multiple sites in spine: Secondary | ICD-10-CM | POA: Diagnosis present

## 2014-10-30 DIAGNOSIS — Z6835 Body mass index (BMI) 35.0-35.9, adult: Secondary | ICD-10-CM | POA: Diagnosis not present

## 2014-10-30 DIAGNOSIS — M5412 Radiculopathy, cervical region: Secondary | ICD-10-CM | POA: Diagnosis not present

## 2014-10-30 DIAGNOSIS — N186 End stage renal disease: Secondary | ICD-10-CM | POA: Diagnosis not present

## 2014-10-30 DIAGNOSIS — E1342 Other specified diabetes mellitus with diabetic polyneuropathy: Secondary | ICD-10-CM | POA: Diagnosis not present

## 2014-10-30 DIAGNOSIS — E1129 Type 2 diabetes mellitus with other diabetic kidney complication: Secondary | ICD-10-CM | POA: Diagnosis not present

## 2014-10-30 DIAGNOSIS — Z993 Dependence on wheelchair: Secondary | ICD-10-CM | POA: Diagnosis not present

## 2014-10-30 DIAGNOSIS — K3184 Gastroparesis: Secondary | ICD-10-CM | POA: Diagnosis not present

## 2014-10-30 DIAGNOSIS — E119 Type 2 diabetes mellitus without complications: Secondary | ICD-10-CM | POA: Diagnosis not present

## 2014-10-30 DIAGNOSIS — E785 Hyperlipidemia, unspecified: Secondary | ICD-10-CM | POA: Diagnosis present

## 2014-10-30 DIAGNOSIS — Z79899 Other long term (current) drug therapy: Secondary | ICD-10-CM | POA: Diagnosis not present

## 2014-10-30 DIAGNOSIS — G4733 Obstructive sleep apnea (adult) (pediatric): Secondary | ICD-10-CM | POA: Diagnosis not present

## 2014-10-30 DIAGNOSIS — M25562 Pain in left knee: Secondary | ICD-10-CM | POA: Diagnosis not present

## 2014-10-30 DIAGNOSIS — I509 Heart failure, unspecified: Secondary | ICD-10-CM | POA: Diagnosis not present

## 2014-10-30 DIAGNOSIS — E1165 Type 2 diabetes mellitus with hyperglycemia: Secondary | ICD-10-CM | POA: Diagnosis not present

## 2014-10-30 DIAGNOSIS — E876 Hypokalemia: Secondary | ICD-10-CM | POA: Diagnosis not present

## 2014-10-30 DIAGNOSIS — M4807 Spinal stenosis, lumbosacral region: Secondary | ICD-10-CM | POA: Diagnosis present

## 2014-10-30 DIAGNOSIS — S37099D Other injury of unspecified kidney, subsequent encounter: Secondary | ICD-10-CM | POA: Diagnosis not present

## 2014-10-30 DIAGNOSIS — R609 Edema, unspecified: Secondary | ICD-10-CM | POA: Diagnosis not present

## 2014-10-30 DIAGNOSIS — D631 Anemia in chronic kidney disease: Secondary | ICD-10-CM | POA: Diagnosis not present

## 2014-10-30 DIAGNOSIS — I330 Acute and subacute infective endocarditis: Secondary | ICD-10-CM | POA: Diagnosis present

## 2014-10-30 DIAGNOSIS — I120 Hypertensive chronic kidney disease with stage 5 chronic kidney disease or end stage renal disease: Secondary | ICD-10-CM | POA: Diagnosis not present

## 2014-10-30 DIAGNOSIS — I10 Essential (primary) hypertension: Secondary | ICD-10-CM | POA: Diagnosis not present

## 2014-10-30 DIAGNOSIS — M4646 Discitis, unspecified, lumbar region: Secondary | ICD-10-CM | POA: Diagnosis not present

## 2014-10-30 DIAGNOSIS — G629 Polyneuropathy, unspecified: Secondary | ICD-10-CM | POA: Diagnosis not present

## 2014-10-30 DIAGNOSIS — E0865 Diabetes mellitus due to underlying condition with hyperglycemia: Secondary | ICD-10-CM | POA: Diagnosis not present

## 2014-10-30 DIAGNOSIS — M545 Low back pain: Secondary | ICD-10-CM | POA: Diagnosis not present

## 2014-10-30 DIAGNOSIS — Z794 Long term (current) use of insulin: Secondary | ICD-10-CM | POA: Diagnosis not present

## 2014-10-30 DIAGNOSIS — Z7982 Long term (current) use of aspirin: Secondary | ICD-10-CM | POA: Diagnosis not present

## 2014-10-30 DIAGNOSIS — Z936 Other artificial openings of urinary tract status: Secondary | ICD-10-CM | POA: Diagnosis not present

## 2014-10-30 DIAGNOSIS — E213 Hyperparathyroidism, unspecified: Secondary | ICD-10-CM | POA: Diagnosis not present

## 2014-10-30 DIAGNOSIS — R339 Retention of urine, unspecified: Secondary | ICD-10-CM | POA: Diagnosis not present

## 2014-10-30 DIAGNOSIS — Z940 Kidney transplant status: Secondary | ICD-10-CM | POA: Diagnosis not present

## 2014-10-30 DIAGNOSIS — Z833 Family history of diabetes mellitus: Secondary | ICD-10-CM | POA: Diagnosis not present

## 2014-10-30 DIAGNOSIS — M10362 Gout due to renal impairment, left knee: Secondary | ICD-10-CM | POA: Diagnosis not present

## 2014-10-30 DIAGNOSIS — E1121 Type 2 diabetes mellitus with diabetic nephropathy: Secondary | ICD-10-CM | POA: Diagnosis not present

## 2014-10-30 DIAGNOSIS — N189 Chronic kidney disease, unspecified: Secondary | ICD-10-CM | POA: Diagnosis not present

## 2014-10-30 DIAGNOSIS — N400 Enlarged prostate without lower urinary tract symptoms: Secondary | ICD-10-CM | POA: Diagnosis present

## 2014-10-30 DIAGNOSIS — I129 Hypertensive chronic kidney disease with stage 1 through stage 4 chronic kidney disease, or unspecified chronic kidney disease: Secondary | ICD-10-CM | POA: Diagnosis not present

## 2014-10-30 DIAGNOSIS — E669 Obesity, unspecified: Secondary | ICD-10-CM | POA: Diagnosis not present

## 2014-10-30 DIAGNOSIS — M4647 Discitis, unspecified, lumbosacral region: Secondary | ICD-10-CM | POA: Diagnosis not present

## 2014-10-30 DIAGNOSIS — Z8249 Family history of ischemic heart disease and other diseases of the circulatory system: Secondary | ICD-10-CM | POA: Diagnosis not present

## 2014-10-30 DIAGNOSIS — E11319 Type 2 diabetes mellitus with unspecified diabetic retinopathy without macular edema: Secondary | ICD-10-CM | POA: Diagnosis not present

## 2014-10-30 DIAGNOSIS — F411 Generalized anxiety disorder: Secondary | ICD-10-CM | POA: Diagnosis not present

## 2014-10-30 DIAGNOSIS — Z880 Allergy status to penicillin: Secondary | ICD-10-CM | POA: Diagnosis not present

## 2014-10-30 DIAGNOSIS — Z791 Long term (current) use of non-steroidal anti-inflammatories (NSAID): Secondary | ICD-10-CM | POA: Diagnosis not present

## 2014-10-30 DIAGNOSIS — J449 Chronic obstructive pulmonary disease, unspecified: Secondary | ICD-10-CM | POA: Diagnosis not present

## 2014-10-30 DIAGNOSIS — M549 Dorsalgia, unspecified: Secondary | ICD-10-CM | POA: Diagnosis not present

## 2014-10-30 LAB — BASIC METABOLIC PANEL
Anion gap: 8 (ref 5–15)
BUN: 75 mg/dL — ABNORMAL HIGH (ref 6–23)
CO2: 24 mmol/L (ref 19–32)
Calcium: 8.7 mg/dL (ref 8.4–10.5)
Chloride: 105 mEq/L (ref 96–112)
Creatinine, Ser: 2.5 mg/dL — ABNORMAL HIGH (ref 0.50–1.35)
GFR calc Af Amer: 33 mL/min — ABNORMAL LOW (ref >90)
GFR calc non Af Amer: 28 mL/min — ABNORMAL LOW (ref >90)
Glucose, Bld: 363 mg/dL — ABNORMAL HIGH (ref 70–99)
Potassium: 4.9 mmol/L (ref 3.5–5.1)
Sodium: 137 mmol/L (ref 135–145)

## 2014-10-30 LAB — GLUCOSE, CAPILLARY
Glucose-Capillary: 308 mg/dL — ABNORMAL HIGH (ref 70–99)
Glucose-Capillary: 303 mg/dL — ABNORMAL HIGH (ref 70–99)
Glucose-Capillary: 267 mg/dL — ABNORMAL HIGH (ref 70–99)

## 2014-10-30 LAB — CBC
HCT: 36.4 % — ABNORMAL LOW (ref 39.0–52.0)
Hemoglobin: 10.8 g/dL — ABNORMAL LOW (ref 13.0–17.0)
MCH: 23.9 pg — ABNORMAL LOW (ref 26.0–34.0)
MCHC: 29.7 g/dL — ABNORMAL LOW (ref 30.0–36.0)
MCV: 80.5 fL (ref 78.0–100.0)
Platelets: 303 10*3/uL (ref 150–400)
RBC: 4.52 MIL/uL (ref 4.22–5.81)
RDW: 15.7 % — ABNORMAL HIGH (ref 11.5–15.5)
WBC: 7.2 10*3/uL (ref 4.0–10.5)

## 2014-10-30 LAB — PSA: PSA: 2.15 ng/mL (ref <=4.00)

## 2014-10-30 MED ORDER — INSULIN ASPART 100 UNIT/ML ~~LOC~~ SOLN: 0.0000 [IU] | Freq: Three times a day (TID) | SUBCUTANEOUS | Status: DC

## 2014-10-30 MED ORDER — INSULIN GLARGINE 100 UNIT/ML ~~LOC~~ SOLN
40.0000 [IU] | Freq: Every day | SUBCUTANEOUS | Status: DC
Start: 2014-10-30 — End: 2015-04-09

## 2014-10-30 MED ORDER — BACLOFEN 5 MG HALF TABLET
5.0000 mg | ORAL_TABLET | Freq: Three times a day (TID) | ORAL | Status: DC
  Administered 2014-10-30 (×2): 5 mg via ORAL
  Filled 2014-10-30 (×3): qty 1

## 2014-10-30 MED ORDER — AMLODIPINE BESYLATE 5 MG PO TABS: 5.0000 mg | ORAL_TABLET | Freq: Every day | ORAL | Status: DC

## 2014-10-30 MED ORDER — HYDROCODONE-ACETAMINOPHEN 5-325 MG PO TABS: 1.0000 | ORAL_TABLET | Freq: Four times a day (QID) | ORAL | Status: DC | PRN

## 2014-10-30 MED ORDER — ENSURE COMPLETE PO LIQD: 237.0000 mL | Freq: Two times a day (BID) | ORAL | Status: DC

## 2014-10-30 MED ORDER — BACLOFEN 10 MG PO TABS: 5.0000 mg | ORAL_TABLET | Freq: Three times a day (TID) | ORAL | Status: DC

## 2014-10-30 MED ORDER — AMLODIPINE BESYLATE 5 MG PO TABS
5.0000 mg | ORAL_TABLET | Freq: Every day | ORAL | Status: DC
  Administered 2014-10-30: 5 mg via ORAL
  Filled 2014-10-30: qty 1

## 2014-10-30 NOTE — Progress Notes (Signed)
Family Medicine Teaching Service Daily Progress Note Intern Pager: [phone]  Patient name: [name] [name] Medical record number: [id number] Date of birth: 1966-01-16 Age: 49 y.o. Gender: male  Primary Care Provider: [name], MD Consultants: Nephro Code Status: Full  Pt Overview and Major Events to Date:  1/13: Admitted for AKI in setting of hx renal transplant 1/13: Renal US with minimal hydronephrosis of renal transplant. 1/18: IR epidural for pain relief  Assessment and Plan: [name] [name] is a 49 y.o. male presenting with back and neck pain, noted to have AKI with Creatinine of 3.18. PMH is significant for CKD s/p renal transplant, HTN, DM type 2, COPD, anxiety, and hyperlipidemia.  # AKI with h/o CKD s/p renal transplant: History of renal transplant in 2009. Renal failure caused by diabetic nephropathy. Baseline Creatinine 2.1. AKI likely due to dehydration vs post renal obstruction given recent urological procedure and mild hydronephroses noted on recent renal US (transplanted kidney in RLQ). Creatinine of 2.66 on admission with a nadir of 3.18. Creatine continuing to improve.  -Creatinine 2.5 - Avoid nephrotoxic medications - Continue Cellcept, Prednisone, and Prograf - Holding Lasix 80mg  BID - renal consulted; appreciate recs - no changes to therapy; signed off  # Anemia: Hemoglobin 9.3 on admission. No evidence of bleeding. Prior to admission Hbgs 12-13. This could indicate worsening renal function. Stable.  - FOBT negative - iron studies low - patient receiving ferric gluconate - Continue to monitor and trend Hbg  #Urinary retention: Patient with recent urologic procedure for urethral stricture. Urologic procedure performed at [facility] due to some diabetic uropathy. Mild hydronephrosis noted on renal US. -Continue to monitor -> patient actually able to sit up to urinate -scheduled voids for patient  -prn in and out cath ordered -urology consulted; appreciate  recs - signed off. Outpatient follow-up.  #UTI: urine culture grew enterococcus species >100,00 colonies. -Continue Levaquin x5 days (1/15- 1/20)  # Back pain: MRI lumbar spine- disc protrusions L4-L5 and L5-S1 with stenosis and annular tear. SNF recommended by PT. Last PSA for patient 03/2010([facility]) and was 1.22. - baclofen ordered - Will transition to oral pain medications today - Norco as needed; pain improved s/p epidural injection. -patient found to have decreased rectal tone  -neurosurgery consulted; appreciate recs - recommended IR epidural.  - T-spine MRI (1/17) - unremarkable - PSA 2.15 -patient willing to go to SNF for PM&R - CSW consulted -PT/OT recommend SNF  # HTN: BP slightly elevated in last 24hr. Home medication includes Clonodine 0.1mg  PRN elevated BP. - Clonidine held -will start amlodipine 5mg  daily - Hydralazine PRN SBP >180 or DBP >110 - Continue to monitor  # DM: CBGs ranging 200-300s. Received 11 units of Novolog in last 24 hours. Elevated sugars likely due to epidural injection of steroid. - A1c 5.9  - Continue Lantus 40U QHS and mod SSI - monitor CBGs  # Anxiety: Home medication includes Klonopin 0.5mg  TID PRN anxiety - Continue Klonipin prn  FEN/GI: Heart Healthy/Carb Modified Diet, NPO for procedure, KVO Prophylaxis: SCDs (Allergic to Pork Derived Products with Anaphylaxis)  Disposition: Continue current management; pending SNF placement.  Subjective:  Patient's pain is improved after epidural steroid injection into lumbar spine yesterday. Patient has more mobility of his legs. Spoke with patient today about need to participate with therapy and CSW so we can get him back to functional status. Patient willing to work with team now that his pain has improved.  Objective: Temp:  [97.4 F (36.3 C)-98.5 F (36.9  C)] 97.5 F (36.4 C) (01/19 0924) Pulse Rate:  [78-91] 84 (01/19 0924) Resp:  [18] 18 (01/19 0924) BP: (130-176)/(59-79) 162/79 mmHg  (01/19 0924) SpO2:  [93 %-94 %] 94 % (01/19 0924)  Physical Exam: General: alert, NAD Cardiovascular: RRR. No murmurs/rubs/gallops.  Respiratory: Clear to auscultation bilaterally anteriorly Abdomen: Bowel sounds noted. Soft and nondistended. No tenderness or masses with palpation. No CVA tenderness noted. Muskuloskeletal: No edema noted.  Skin: No rashes noted. WWP Neuro: limited exam due to patient compliance. Able to lift both legs about 30 degrees off bed. Has FROM of ankles bilaterally. Sensation intact. Muscle tone and bulk appropriate. A&Ox3.  Laboratory: Results for orders placed or performed during the hospital encounter of 10/24/14 (from the past 24 hour(s))  Glucose, capillary     Status: Abnormal   Collection Time: 10/29/14 11:32 AM  Result Value Ref Range   Glucose-Capillary 113 (H) 70 - 99 mg/dL  PSA     Status: None   Collection Time: 10/29/14 12:30 PM  Result Value Ref Range   PSA 2.15 <=4.00 ng/mL  Glucose, capillary     Status: Abnormal   Collection Time: 10/29/14  4:16 PM  Result Value Ref Range   Glucose-Capillary 173 (H) 70 - 99 mg/dL  Glucose, capillary     Status: Abnormal   Collection Time: 10/29/14  9:57 PM  Result Value Ref Range   Glucose-Capillary 236 (H) 70 - 99 mg/dL  Glucose, capillary     Status: Abnormal   Collection Time: 10/30/14  8:00 AM  Result Value Ref Range   Glucose-Capillary 308 (H) 70 - 99 mg/dL  CBC     Status: Abnormal   Collection Time: 10/30/14  9:54 AM  Result Value Ref Range   WBC 7.2 4.0 - 10.5 K/uL   RBC 4.52 4.22 - 5.81 MIL/uL   Hemoglobin 10.8 (L) 13.0 - 17.0 g/dL   HCT 36.4 (L) 39.0 - 52.0 %   MCV 80.5 78.0 - 100.0 fL   MCH 23.9 (L) 26.0 - 34.0 pg   MCHC 29.7 (L) 30.0 - 36.0 g/dL   RDW 15.7 (H) 11.5 - 15.5 %   Platelets 303 150 - 400 K/uL  Basic metabolic panel     Status: Abnormal   Collection Time: 10/30/14  9:54 AM  Result Value Ref Range   Sodium 137 135 - 145 mmol/L   Potassium 4.9 3.5 - 5.1 mmol/L    Chloride 105 96 - 112 mEq/L   CO2 24 19 - 32 mmol/L   Glucose, Bld 363 (H) 70 - 99 mg/dL   BUN 75 (H) 6 - 23 mg/dL   Creatinine, Ser 2.50 (H) 0.50 - 1.35 mg/dL   Calcium 8.7 8.4 - 10.5 mg/dL   GFR calc non Af Amer 28 (L) >90 mL/min   GFR calc Af Amer 33 (L) >90 mL/min   Anion gap 8 5 - 15    Imaging/Diagnostic Tests: Mr Thoracic Spine Wo Contrast  10/28/2014   CLINICAL DATA:  New onset back pain without history of injury. History of renal transplant.  EXAM: MRI THORACIC SPINE WITHOUT CONTRAST  TECHNIQUE: Multiplanar, multisequence MR imaging of the thoracic spine was performed. No intravenous contrast was administered.  COMPARISON:  CT abdomen and pelvis 12/30/2009. Lumbar spine MRI 10/24/2014.  FINDINGS: As seen on the patient's lumbar spine MRI, marrow signal is diffusely depressed on T1 weighted imaging without T2 hyperintensity. Vertebral body height and alignment are maintained. The thoracic cord demonstrates normal signal throughout. Intervertebral  disc space height is maintained at all levels. The central canal and foramina appear patent at all levels.  Paraspinous structures demonstrate a small lesion in the left adrenal gland consistent with a benign adenoma as seen on the prior CT scan. The patient also appears to have very small bilateral pleural effusions, greater on the left.  IMPRESSION: Negative for fracture or spondylosis to explain the patient's back pain.  Diffusely decreased marrow signal on T1 weighted imaging is nonspecific and may be due to marrow reactivation in this renal transplant patient rather than marrow infiltrative process.   Electronically Signed   By: Inge Rise M.D.   On: 10/28/2014 15:38   Ir Epidurography  10/29/2014   CLINICAL DATA:  Low back pain. No significant radicular component. No previous lumbar surgery. Disc protrusions L4-5, L5-S1 on recent MR.  FLUOROSCOPY TIME:  12 seconds  PROCEDURE: LUMBAR EPIDURAL INJECTION:  An interlaminar approach was  performed on the left at L4-5. The overlying skin was cleansed and anesthetized. A 20 gauge spinal needle was advanced using loss-of-resistance technique. Injection of 2cc of Omnipaque 180 confirmed epidural placement. There was no evidence for intravascular or intrathecal spread of contrast.  I then injected 120 mg of Depo-Medrol and 46ml of 1% lidocaine. The patient tolerated the procedure without evidence for complication. The patient was observed for 20 minutes returned to the floor in stable neurologic condition.  IMPRESSION: Technically successful interlaminar epidural steroid injection on the left at L4-5.   Electronically Signed   By: Arne Cleveland M.D.   On: 10/29/2014 13:31    Katheren Shams, DO 10/30/2014, 11:31 AM PGY-1, [facility] Intern pager: [phone], text pages welcome

## 2014-10-30 NOTE — Discharge Summary (Signed)
[facility] Discharge Summary  Patient name: [name] [name] Medical record number: [id number] Date of birth: 1966-07-13 Age: 49 y.o. Gender: male Date of Admission: 10/24/2014  Date of Discharge: 10/30/14 Admitting Physician: Lupita Dawn, MD  Primary Care Provider: [name], MD Consultants: neurosurgery, Urology, Nephrology  Indication for Hospitalization: AKI and severe LBP  Discharge Diagnoses/Problem List:  Back Pain Urinary Retention AKI with CKD s/p renal transplant Anemia HTN DM Anxiety  Disposition: SNF  Discharge Condition: Stable  Brief Hospital Course:  [name] [name] is a 49yo male who presented on 10/24/13 with severe back pain and was noted to have acute on chronic kidney disease with creatinine of 3.18. Past medical history was significant for renal transplant in 2009. Renal ultrasound showed mild hydronephrosis. Lasix and Metolazone were held during hospitalization.  Nephrology was consulted and recommended Foley placement. Mr. Gilliam refused Foley throughout hospitalization. Hemoglobin was noted to be low at 9.3 at admission. FOBT was negative. Iron studies were low, so ferric gluconate as given. Creatinine improved to 2.5 prior to discharge.  Back pain was treated with pain medication throughout hospitalization. MRI of lumbar spine showed disc protrusions of L4-L5 and L5-S1 with stenosis and an annular tear. Thoracic MRI unremarkable. PSA 2.15. Decreased rectal tone noted on exam. PT recommended SNF placement. Baclofen ordered. Neurosurgery consulted and recommended IR epidural, which was done on 10/29/13.  Urinary retention was noted during hospitalization. Recent urologic procedure for urethral stricture in December 2015. Renal ultrasound showed mild hydronephrosis.  Urine culture grew enterococcus species. Levaquin was initiated for five day course. Urology was consulted. Mr. Hoopes refused Foley throughout hospitalization.  Blood  pressure was noted to be elevated. Amlodipine 69m initiated on 1/19.   Discharged to SNF on 1/19 with improvement of pain.  Issues for Follow Up:  1. Follow-up on lower back pain 2. Continued OT/PT 3. Monitor voids, patient needs follow-up with urology 4. Follow up CBC, BMP 5. Levaquin was initiated for five day course for treatment of Enterococcus UTI. End date 1/20. 6. Amlodipine 56minitiated. Monitor BP and adjust as indicated. 7. Monitor CBGs and adjust insulin regimen as indicated. 8. Follow up fluid status. Restart Lasix 80 BID and Metolazone 65m59mvery Monday, Wednesday, Friday if indicated.  Significant Procedures: IR epidurography  Significant Labs and Imaging:   Recent Labs Lab 10/28/14 0540 10/29/14 0905 10/30/14 0954  WBC 8.2 9.3 7.2  HGB 10.4* 9.9* 10.8*  HCT 35.0* 33.4* 36.4*  PLT 310 319 303    Recent Labs Lab 10/24/14 1045 10/25/14 0430 10/26/14 0736 10/27/14 0531 10/28/14 0540 10/29/14 0905 10/30/14 0954  NA 131* 140 138 139 137 137 137  K 4.2 4.1 4.4 5.0 4.3 4.3 4.9  CL 98 101 104 106 103 104 105  CO2 22 22 18* _0 GLUCOSE 153* 189* 109* 206* 168* 116* 363*  BUN 61* 61* 67* 73* 71* 72* 75*  CREATININE 3.18* 3.09* 3.11* 2.89* 2.64* 2.65* 2.50*  CALCIUM 8.3* 8.7 8.5 8.3* 8.6 8.5 8.7  PHOS  --  4.1  --   --   --   --   --   ALKPHOS 88  --   --   --   --   --   --   AST 15  --   --   --   --   --   --   ALT 12  --   --   --   --   --   --  ALBUMIN 2.8* 2.7*  --   --   --   --   --    Iron/TIBC/Ferritin/ %Sat    Component Value Date/Time   IRON <10* 10/26/2014 0736   TIBC Not calculated due to Iron <10. 10/26/2014 0736   FERRITIN 356* 10/24/2014 2150   IRONPCTSAT Not calculated due to Iron <10. 10/26/2014 0736  - CK 38 - Vitamin B12 236 - ESR 80 - A1C 5.9 - PSA 2.15  Mr Lumbar Spine Wo Contrast 10/24/2014  IMPRESSION: 1. Leftward disc protrusion and prominent epidural fat at L5-S1 with mild left subarticular stenosis. 2. Leftward  disc protrusion and annular tear at L4-5 with mild subarticular stenosis bilaterally, left greater than right. 3. Mild foraminal narrowing bilaterally at L4-5 as well.   Electronically Signed   By: Lawrence Santiago M.D.   On: 10/24/2014 16:11   US Renal 10/25/2014  IMPRESSION: 1. 4.2 cm simple cyst at the interpole region of the right kidney. No suspicious masses seen. 2. Diffusely increased parenchymal echogenicity and marked bilateral native renal cortical thinning, compatible with the patient's known chronic native renal failure. The transplant kidney at the right iliac fossa is not assessed on this study. Per discussion with the clinical team, the only clinical question is in regard to the native right renal cyst, and thus assessment of the transplant kidney is not currently requested. 3. The bladder is significantly distended but otherwise grossly unremarkable. The patient does not describe any urinary urgency at this time; would consider clinical correlation for urinary retention.      Results/Tests Pending at Time of Discharge: None  Discharge Medications:    Medication List    STOP taking these medications        CIALIS 5 MG tablet  Generic drug:  tadalafil     cloNIDine 0.1 MG tablet  Commonly known as:  CATAPRES     cyclobenzaprine 10 MG tablet  Commonly known as:  FLEXERIL     furosemide 40 MG tablet  Commonly known as:  LASIX     insulin lispro 100 UNIT/ML injection  Commonly known as:  HUMALOG     metolazone 5 MG tablet  Commonly known as:  ZAROXOLYN     potassium chloride 10 MEQ CR capsule  Commonly known as:  MICRO-K     pregabalin 50 MG capsule  Commonly known as:  LYRICA     sildenafil 100 MG tablet  Commonly known as:  VIAGRA      TAKE these medications        allopurinol 100 MG tablet  Commonly known as:  ZYLOPRIM  Take 100 mg by mouth daily. Take with the 366m tablet to make a total of 4022mdaily per patient     allopurinol 300 MG tablet  Commonly  known as:  ZYLOPRIM  Take 300 mg by mouth daily. Take with the 10087mablet to make a total dose of 400m38mday per patient     amLODipine 5 MG tablet  Commonly known as:  NORVASC  Take 1 tablet (5 mg total) by mouth daily.     aspirin 325 MG tablet  Take 325 mg by mouth daily.     baclofen 10 MG tablet  Commonly known as:  LIORESAL  Take 0.5 tablets (5 mg total) by mouth 3 (three) times daily.     calcitRIOL 0.5 MCG capsule  Commonly known as:  ROCALTROL  Take 0.5 mcg by mouth at bedtime.     clonazePAM 0.5  MG tablet  Commonly known as:  KLONOPIN  Take 0.5 mg by mouth 3 (three) times daily as needed for anxiety.     feeding supplement (ENSURE COMPLETE) Liqd  Take 237 mLs by mouth 2 (two) times daily between meals.     HYDROcodone-acetaminophen 5-325 MG per tablet  Commonly known as:  NORCO/VICODIN  Take 1-2 tablets by mouth every 6 (six) hours as needed for moderate pain or severe pain.     insulin aspart 100 UNIT/ML injection  Commonly known as:  novoLOG  - Inject 0-15 Units into the skin 3 (three) times daily with meals. CBG < 70: implement hypoglycemia protocol  - CBG 70 - 120: 0 units  - CBG 121 - 150: 2 units  - CBG 151 - 200: 3 units  - CBG 201 - 250: 5 units  - CBG 251 - 300: 8 units  - CBG 301 - 350: 11 units  - CBG 351 - 400: 15 units  - CBG > 400: call MD     insulin glargine 100 UNIT/ML injection  Commonly known as:  LANTUS  Inject 0.4 mLs (40 Units total) into the skin daily.     mycophenolate 250 MG capsule  Commonly known as:  CELLCEPT  Take 750 mg by mouth 2 (two) times daily.     omeprazole 20 MG capsule  Commonly known as:  PRILOSEC  Take 20 mg by mouth daily.     predniSONE 5 MG tablet  Commonly known as:  DELTASONE  Take 5 mg by mouth daily.     rosuvastatin 20 MG tablet  Commonly known as:  CRESTOR  Take 20 mg by mouth at bedtime.     tacrolimus 0.5 MG capsule  Commonly known as:  PROGRAF  Take 1 mg by mouth 2 (two) times  daily.        Discharge Instructions: Please refer to Patient Instructions section of EMR for full details.  Patient was counseled important signs and symptoms that should prompt return to medical care, changes in medications, dietary instructions, activity restrictions, and follow up appointments.   Follow-Up Appointments: Follow-up Information    Follow up with Malka So, MD.   Specialty:  Urology   Why:  Please make an appointment for 2-3 weeks following discharge.    Contact information:   [place] [address] [phone]       Puako N Rumley, DO 10/30/2014, 2:46 PM PGY-1, [facility]

## 2014-10-30 NOTE — Progress Notes (Signed)
Physical Therapy Treatment Patient Details Name: [name] [name] MRN: [id number] DOB: [birth year] Today's Date: 10/30/2014    History of Present Illness [name] [name] is a 49 y.o. male presenting with back and neck pain, noted to have AKI with Creatinine of 3.18. PMH is significant for CKD s/p renal transplant, HTN, DM type 2, COPD, anxiety, and hyperlipidemia.    PT Comments    Pt making very slow progress towards physical therapy goals. Communication is difficult at times due to pt yelling, and it did not appear that pt was receptive to any education at this time. Will continue to follow and progress as able per POC.    Follow Up Recommendations  SNF;Supervision/Assistance - 24 hour     Equipment Recommendations  Rolling walker with 5" wheels    Recommendations for Other Services       Precautions / Restrictions Precautions Precautions: Fall;Back Precaution Booklet Issued: No Precaution Comments: Discussed benefits of log roll technique to minimize stress on back, pt appeared somewhat more receptive than previous session.  Restrictions Weight Bearing Restrictions: No    Mobility  Bed Mobility Overal bed mobility: +2 for physical assistance Bed Mobility: Rolling;Sidelying to Sit;Sit to Sidelying Rolling: Max assist;Total assist;+2 for physical assistance Sidelying to sit: Total assist;+2 for physical assistance     Sit to sidelying: Total assist;+2 for physical assistance General bed mobility comments: Increased cueing and explanation to convince pt to attempt log roll this session. Bed pad used to facilitate roll, and total assist was required for LE movement and trunk support to transition to full sitting.   Transfers                 General transfer comment: Unwilling to attempt any further mobility at this time due to pain.   Ambulation/Gait                 Stairs            Wheelchair Mobility    Modified Rankin (Stroke Patients Only)        Balance Overall balance assessment: Needs assistance Sitting-balance support: Feet supported;Bilateral upper extremity supported Sitting balance-Leahy Scale: Fair Sitting balance - Comments: Pt does well sitting EOB from a balance perspective. Unable to truly test balance as pt is unwilling to sit unsupported, and screams out the entire time he is sitting upright.                             Cognition Arousal/Alertness: Awake/alert Behavior During Therapy: Anxious Overall Cognitive Status: Within Functional Limits for tasks assessed                      Exercises      General Comments        Pertinent Vitals/Pain Pain Assessment: Faces Faces Pain Scale: Hurts worst Pain Location: lower back Pain Descriptors / Indicators: Aching;Burning Pain Intervention(s): Monitored during session;Premedicated before session    Home Living                      Prior Function            PT Goals (current goals can now be found in the care plan section) Acute Rehab PT Goals Patient Stated Goal: Decrease back pain PT Goal Formulation: With patient Time For Goal Achievement: 11/08/14 Potential to Achieve Goals: Fair Progress towards PT goals: Progressing toward goals    Frequency  Min  2X/week    PT Plan Current plan remains appropriate    Co-evaluation             End of Session   Activity Tolerance: Patient limited by pain Patient left: in bed;with call bell/phone within reach;with bed alarm set     Time: [id number] PT Time Calculation (min) (ACUTE ONLY): 23 min  Charges:  $Therapeutic Activity: 23-37 mins                    G Codes:      Rolinda Roan 11-28-14, 1:34 PM  Rolinda Roan, PT, DPT Acute Rehabilitation Services Pager: [phone]

## 2014-10-30 NOTE — Progress Notes (Signed)
°   10/30/14 1600  Clinical Encounter Type  Visited With Patient  Visit Type Initial  Referral From Nurse  Consult/Referral To None  Spiritual Encounters  Spiritual Needs Other (Comment)  Stress Factors  Patient Stress Factors None identified  Family Stress Factors None identified   Pt was alert and invited me for the visit.  He said he traveled a lot around the world to visit family and friends as well as work.  He explained to me the difference in teas and chocolates.  He was upset as he didn't like the nurse or doctor coming in and raising their voices to wake him up.  He said it startled him.    He spent fourteen years of his childhood in a [employer].  His friend is a Protestant.  He shared about angels and the two small voices that tell us what to do.  He told me to remember to feed the love and let the hate starve.  He is looking forward to being discharged so he can have a good cup of tea that he makes himself.  Drue Dun, Chaplain Intern 10/30/2014, 4:30 PM

## 2014-10-30 NOTE — Progress Notes (Signed)
Occupational Therapy Treatment Patient Details Name: [name] [name] MRN: [id number] DOB: Mar 06, 1966 Today's Date: 10/30/2014    History of present illness [name] [name] is a 49 y.o. male presenting with back and neck pain, noted to have AKI with Creatinine of 3.18. PMH is significant for CKD s/p renal transplant, HTN, DM type 2, COPD, anxiety, and hyperlipidemia.   OT comments  Limited participation and progress secondary to constant complaints of pain with screaming and yelling.  Pt. Unable or willing to follow instruction for comfort during bed mobility.  Refused log rolling, and supine to sit today.  Remains 2 person assist for safety and physical need.    Follow Up Recommendations  SNF;Supervision/Assistance - 24 hour    Equipment Recommendations       Recommendations for Other Services      Precautions / Restrictions Precautions Precautions: Fall;Back Precaution Comments: Discussed benefits of log roll technique to minimize stress on back, however pt did not appear receptive.  Restrictions Weight Bearing Restrictions: No       Mobility Bed Mobility Overal bed mobility: +2 for physical assistance             General bed mobility comments: max/dep. with 2 person assist to aide with pulling pt. to hob, also to increase hob height to allow for safe meds consumption.  screaming and yelling the entire time  Transfers                 General transfer comment: Unable to attempt at this time due to pain.     Balance                                   ADL                                         General ADL Comments: Pt is currently unable to participate meaningfully in ADL's and functional mobility/transfers due to c/o pain in low back. Pt was educated in role of OT .  refusing all attempts at skilled intervention.  RN and Radio producer present.  we all attempted together for pt. to initiate bed mobility.  continues to scream and yell and  resist.  would only allow hob elevated in one press at a time taking 3 presses to equal one change in degree of hob elevation.  max education to review need to take meds upright esp. with wanting to drink water also.  pt. states "no, no, no i am going to scream i will" Will attempt OT for ADL retrainign and functional mobiilty and transfers as pt is able to tolerate.      Vision                     Perception     Praxis      Cognition   Behavior During Therapy: Anxious                         Extremity/Trunk Assessment               Exercises     Shoulder Instructions       General Comments      Pertinent Vitals/ Pain       Pain Assessment: 0-10 Pain Score: 10-Worst pain ever Pain  Location: lower back Pain Descriptors / Indicators: Aching;Burning Pain Intervention(s): Monitored during session;Repositioned;RN gave pain meds during session  Home Living                                          Prior Functioning/Environment              Frequency Min 2X/week     Progress Toward Goals  OT Goals(current goals can now be found in the care plan section)  Progress towards OT goals: Not progressing toward goals - comment (limited ability to assess or engage due to pt. refusal and screaming)     Plan      Co-evaluation                 End of Session     Activity Tolerance Patient limited by pain;Treatment limited secondary to agitation   Patient Left in bed;with call bell/phone within reach;with bed alarm set   Nurse Communication Mobility status;Patient requests pain meds        Time: [id number] OT Time Calculation (min): 15 min  Charges: OT General Charges $OT Visit: 1 Procedure OT Treatments $Therapeutic Activity: 8-22 mins  [name] [name], COTA/L 10/30/2014, 9:21 AM

## 2014-10-30 NOTE — Progress Notes (Signed)
Pt prepared for d/c to SNF. IV d/c'd. Skin intact except as most recently charted. Vitals are stable. Report called to Tish at [facility]) (receiving facility). Pt to be transported by ambulance service.  Jillyn Ledger, MBA, BS, RN

## 2014-10-30 NOTE — Discharge Instructions (Signed)
We are discharging you on Amlodipine 5mg  for your blood pressure. We are also decreasing your dose of Lantus. The doctors at the nursing facility will check your blood sugars and adjust your dose as needed. Please follow up with Urology in 2-3 weeks.  Acute Kidney Injury Acute kidney injury is a disease in which there is sudden (acute) damage to the kidneys. The kidneys are 2 organs that lie on either side of the spine between the middle of the back and the front of the abdomen. The kidneys:  Remove wastes and extra water from the blood.   Produce important hormones. These help keep bones strong, regulate blood pressure, and help create red blood cells.   Balance the fluids and chemicals in the blood and tissues. A small amount of kidney damage may not cause problems, but a large amount of damage may make it difficult or impossible for the kidneys to work the way they should. Acute kidney injury may develop into long-lasting (chronic) kidney disease. It may also develop into a life-threatening disease called end-stage kidney disease. Acute kidney injury can get worse very quickly, so it should be treated right away. Early treatment may prevent other kidney diseases from developing.  CAUSES   A problem with blood flow to the kidneys. This may be caused by:   Blood loss.   Heart disease.   Severe burns.   Liver disease.  Direct damage to the kidneys. This may be caused by:  Some medicines.   A kidney infection.   Poisoning or consuming toxic substances.   A surgical wound.   A blow to the kidney area.   A problem with urine flow. This may be caused by:   Cancer.   Kidney stones.   An enlarged prostate. SYMPTOMS   Swelling (edema) of the legs, ankles, or feet.   Tiredness (lethargy).   Nausea or vomiting.   Confusion.   Problems with urination, such as:   Painful or burning feeling during urination.   Decreased urine production.   Frequent  accidents in children who are potty trained.   Bloody urine.   Muscle twitches and cramps.   Shortness of breath.   Seizures.   Chest pain or pressure. Sometimes, no symptoms are present. DIAGNOSIS Acute kidney injury may be detected and diagnosed by tests, including blood, urine, imaging, or kidney biopsy tests.  TREATMENT Treatment of acute kidney injury varies depending on the cause and severity of the kidney damage. In mild cases, no treatment may be needed. The kidneys may heal on their own. If acute kidney injury is more severe, your caregiver will treat the cause of the kidney damage, help the kidneys heal, and prevent complications from occurring. Severe cases may require a procedure to remove toxic wastes from the body (dialysis) or surgery to repair kidney damage. Surgery may involve:   Repair of a torn kidney.   Removal of an obstruction. Most of the time, you will need to stay overnight at the hospital.  HOME CARE INSTRUCTIONS:  Follow your prescribed diet.  Only take over-the-counter or prescription medicines as directed by your caregiver.  Do not take any new medicines (prescription, over-the-counter, or nutritional supplements) unless approved by your caregiver. Many medicines can worsen your kidney damage or need to have the dose adjusted.   Keep all follow-up appointments as directed by your caregiver.  Observe your condition to make sure you are healing as expected. SEEK IMMEDIATE MEDICAL CARE IF:  You are feeling ill or  have severe pain in the back or side.   Your symptoms return or you have new symptoms.  You have any symptoms of end-stage kidney disease. These include:   Persistent itchiness.   Loss of appetite.   Headaches.   Abnormally dark or light skin.  Numbness in the hands or feet.   Easy bruising.   Frequent hiccups.   Menstruation stops.   You have a fever.  You have increased urine production.  You have pain  or bleeding when urinating. MAKE SURE YOU:   Understand these instructions.  Will watch your condition.  Will get help right away if you are not doing well or get worse Document Released: 04/13/2011 Document Revised: 01/23/2013 Document Reviewed: 05/27/2012 [facility] Patient Information 2015 [place], [place]. This information is not intended to replace advice given to you by your health care provider. Make sure you discuss any questions you have with your health care provider.

## 2014-11-03 ENCOUNTER — Non-Acute Institutional Stay (SKILLED_NURSING_FACILITY): Payer: Medicare Other | Admitting: Internal Medicine

## 2014-11-03 DIAGNOSIS — F411 Generalized anxiety disorder: Secondary | ICD-10-CM

## 2014-11-03 DIAGNOSIS — G629 Polyneuropathy, unspecified: Secondary | ICD-10-CM | POA: Diagnosis not present

## 2014-11-03 DIAGNOSIS — M545 Low back pain, unspecified: Secondary | ICD-10-CM

## 2014-11-03 DIAGNOSIS — Z940 Kidney transplant status: Secondary | ICD-10-CM

## 2014-11-03 DIAGNOSIS — I10 Essential (primary) hypertension: Secondary | ICD-10-CM | POA: Diagnosis not present

## 2014-11-03 DIAGNOSIS — R339 Retention of urine, unspecified: Secondary | ICD-10-CM

## 2014-11-03 DIAGNOSIS — E1342 Other specified diabetes mellitus with diabetic polyneuropathy: Secondary | ICD-10-CM

## 2014-11-03 DIAGNOSIS — E1142 Type 2 diabetes mellitus with diabetic polyneuropathy: Secondary | ICD-10-CM

## 2014-11-03 NOTE — Progress Notes (Signed)
Patient ID: [name] [name], male   DOB: 01/01/1966, 49 y.o.   MRN: [id number]    HISTORY AND PHYSICAL  Location:    [facility]        Place of Service:   SNF  Extended Emergency Contact Information Primary Emergency Contact: Ali,Badi  [place] [employer] [place] Phone: [phone] Relation: Friend  Advanced Directive information   Full code  Chief Complaint  Patient presents with  . New Admit To SNF    urinary retention with hx renal tx, AKI, chronic back/neck pain s/p lumbar epidural, DM with neuropathy/retinopathy,HTN, severe obesity, hyperlipidemia, anxiety, PTSD, anemia, COPD    HPI:  49 yo male seen as a new admission to SNF for above. He c/o 10/10 pain, localized and sharp in lower back. He had the pain prior to hospital admission. MRI done of L spine. No loss of bladder/bowel control. No new numbness/tingling.   No low BS reactions. CBG today 289  Past Medical History  Diagnosis Date  . Diabetes mellitus   . Hypertension   . Leg pain 02/05/11    with walking  . Weight gain   . Shortness of breath     when lying flat  . Diminished eyesight   . Hearing difficulty   . Chronic kidney disease     Dia;ysis MWF  . Anemia   . Nervousness(799.21)   . COPD (chronic obstructive pulmonary disease)   . CHF (congestive heart failure)   . Posttraumatic stress disorder   . Allergic rhinitis   . Dyslipidemia   . Morbid obesity   . Diabetic peripheral neuropathy   . Gait disorder   . Obstructive sleep apnea   . Bilateral carpal tunnel syndrome   . Anxiety disorder   . Gastroparesis   . Gout   . Diabetic retinopathy   . Renal transplant, status post 04/05/2014  . Incomplete bladder emptying   . Urethral stricture     Past Surgical History  Procedure Laterality Date  . Kidney transplant  2009  . Cataract extraction w/ intraocular lens  implant, bilateral Bilateral   . Av fistula placement Bilateral   . Arteriovenous graft placement Bilateral  "several"  . Cystoscopy w/ internal urethrotomy      Patient Care Team: Jamal Maes, MD as PCP - General (Nephrology)  History   Social History  . Marital Status: Divorced    Spouse Name: N/A    Number of Children: 0  . Years of Education: College    Occupational History  .     Social History Main Topics  . Smoking status: Never Smoker   . Smokeless tobacco: Never Used  . Alcohol Use: No  . Drug Use: No  . Sexual Activity: Not on file   Other Topics Concern  . Not on file   Social History Narrative   Patient is separated.   Patient does not have any children.   Patient is right-handed   Patient is currently in college working on his Ph.D.           reports that he has never smoked. He has never used smokeless tobacco. He reports that he does not drink alcohol or use illicit drugs.  Family History  Problem Relation Age of Onset  . Hypertension Mother   . Diabetes Father    Family Status  Relation Status Death Age  . Mother Alive   . Father Alive   . Sister Alive   . Brother Alive   .  Sister Alive   . Brother Alive   . Brother Alive   . Brother Marketing executive History  Administered Date(s) Administered  . Influenza-Unspecified 06/12/2014    Allergies  Allergen Reactions  . Penicillins Nausea Only and Other (See Comments)    Temp changes of body  . Pork-Derived Products Anaphylaxis and Other (See Comments)    Fever   . Ace Inhibitors Cough    Medications: Patient's Medications  New Prescriptions   No medications on file  Previous Medications   ALLOPURINOL (ZYLOPRIM) 100 MG TABLET    Take 100 mg by mouth daily. Take with the 300mg  tablet to make a total of 400mg  daily per patient   ALLOPURINOL (ZYLOPRIM) 300 MG TABLET    Take 300 mg by mouth daily. Take with the 100mg  tablet to make a total dose of 400mg  a day per patient   AMLODIPINE (NORVASC) 5 MG TABLET    Take 1 tablet (5 mg total) by mouth daily.   ASPIRIN 325 MG TABLET    Take  325 mg by mouth daily.    BACLOFEN (LIORESAL) 10 MG TABLET    Take 0.5 tablets (5 mg total) by mouth 3 (three) times daily.   CALCITRIOL (ROCALTROL) 0.5 MCG CAPSULE    Take 0.5 mcg by mouth at bedtime.    CLONAZEPAM (KLONOPIN) 0.5 MG TABLET    Take 0.5 mg by mouth 3 (three) times daily as needed for anxiety.    FEEDING SUPPLEMENT, ENSURE COMPLETE, (ENSURE COMPLETE) LIQD    Take 237 mLs by mouth 2 (two) times daily between meals.   HYDROCODONE-ACETAMINOPHEN (NORCO/VICODIN) 5-325 MG PER TABLET    Take 1-2 tablets by mouth every 6 (six) hours as needed for moderate pain or severe pain.   INSULIN ASPART (NOVOLOG) 100 UNIT/ML INJECTION    Inject 0-15 Units into the skin 3 (three) times daily with meals. CBG < 70: implement hypoglycemia protocol CBG 70 - 120: 0 units CBG 121 - 150: 2 units CBG 151 - 200: 3 units CBG 201 - 250: 5 units CBG 251 - 300: 8 units CBG 301 - 350: 11 units CBG 351 - 400: 15 units CBG > 400: call MD   INSULIN GLARGINE (LANTUS) 100 UNIT/ML INJECTION    Inject 0.4 mLs (40 Units total) into the skin daily.   MYCOPHENOLATE (CELLCEPT) 250 MG CAPSULE    Take 750 mg by mouth 2 (two) times daily.    OMEPRAZOLE (PRILOSEC) 20 MG CAPSULE    Take 20 mg by mouth daily.   PREDNISONE (DELTASONE) 5 MG TABLET    Take 5 mg by mouth daily.     ROSUVASTATIN (CRESTOR) 20 MG TABLET    Take 20 mg by mouth at bedtime.    TACROLIMUS (PROGRAF) 0.5 MG CAPSULE    Take 1 mg by mouth 2 (two) times daily.   Modified Medications   No medications on file  Discontinued Medications   No medications on file   Past medical, surgical, family and social history reviewed   Review of Systems  As above. All other systems reviewed are negative.  Filed Vitals:   11/01/14 2053  BP: 136/64  Pulse: 76  Temp: 98.1 F (36.7 C)  Weight: 258 lb (117.028 kg)   Body mass index is 34.98 kg/(m^2).  Physical Exam CONSTITUTIONAL: Looks uncomfortable in NAD. Awake, alert and oriented x 3. Lying supine HEENT:  PERRLA. Oropharynx clear and without exudate NECK: Supple. Nontender. No palpable cervical or supraclavicular  lymph nodes. No carotid bruit b/l. No thyromegaly or thyroid mass palpable.  CVS: Regular rate without murmur, gallop or rub. LUNGS: CTA b/l no wheezing, rales or rhonchi. ABDOMEN: Bowel sounds present x 4. Soft, nontender, nondistended. No palpable mass or bruit. Right lower abdominal renal tx palpable and NT EXTREMITIES: No edema b/l. Distal pulses palpable. No calf tenderness PSYCH: Affect, behavior and mood normal MUSC: FROM of legs but pt refuses exam of lower back due to pain   Labs reviewed: Admission on 10/24/2014, Discharged on 10/30/2014  No results displayed because visit has over 200 results.     CBC Latest Ref Rng 10/30/2014 10/29/2014 10/28/2014  WBC 4.0 - 10.5 K/uL 7.2 9.3 8.2  Hemoglobin 13.0 - 17.0 g/dL 10.8(L) 9.9(L) 10.4(L)  Hematocrit 39.0 - 52.0 % 36.4(L) 33.4(L) 35.0(L)  Platelets 150 - 400 K/uL 303 319 310    CMP Latest Ref Rng 10/30/2014 10/29/2014 10/28/2014  Glucose 70 - 99 mg/dL 363(H) 116(H) 168(H)  BUN 6 - 23 mg/dL 75(H) 72(H) 71(H)  Creatinine 0.50 - 1.35 mg/dL 2.50(H) 2.65(H) 2.64(H)  Sodium 135 - 145 mmol/L 137 137 137  Potassium 3.5 - 5.1 mmol/L 4.9 4.3 4.3  Chloride 96 - 112 mEq/L 105 104 103  CO2 19 - 32 mmol/L 24 25 24   Calcium 8.4 - 10.5 mg/dL 8.7 8.5 8.6  Total Protein 6.0 - 8.3 g/dL - - -  Total Bilirubin 0.3 - 1.2 mg/dL - - -  Alkaline Phos 39 - 117 U/L - - -  AST 0 - 37 U/L - - -  ALT 0 - 53 U/L - - -     Mr Thoracic Spine Wo Contrast  10/28/2014   CLINICAL DATA:  New onset back pain without history of injury. History of renal transplant.  EXAM: MRI THORACIC SPINE WITHOUT CONTRAST  TECHNIQUE: Multiplanar, multisequence MR imaging of the thoracic spine was performed. No intravenous contrast was administered.  COMPARISON:  CT abdomen and pelvis 12/30/2009. Lumbar spine MRI 10/24/2014.  FINDINGS: As seen on the patient's lumbar spine  MRI, marrow signal is diffusely depressed on T1 weighted imaging without T2 hyperintensity. Vertebral body height and alignment are maintained. The thoracic cord demonstrates normal signal throughout. Intervertebral disc space height is maintained at all levels. The central canal and foramina appear patent at all levels.  Paraspinous structures demonstrate a small lesion in the left adrenal gland consistent with a benign adenoma as seen on the prior CT scan. The patient also appears to have very small bilateral pleural effusions, greater on the left.  IMPRESSION: Negative for fracture or spondylosis to explain the patient's back pain.  Diffusely decreased marrow signal on T1 weighted imaging is nonspecific and may be due to marrow reactivation in this renal transplant patient rather than marrow infiltrative process.   Electronically Signed   By: Inge Rise M.D.   On: 10/28/2014 15:38   Mr Lumbar Spine Wo Contrast  10/24/2014   CLINICAL DATA:  Low back pain rated 10/10. Severe burning pain in the right lateral aspect of his neck.  EXAM: MRI LUMBAR SPINE WITHOUT CONTRAST  TECHNIQUE: Multiplanar, multisequence MR imaging of the lumbar spine was performed. No intravenous contrast was administered.  COMPARISON:  CT of the abdomen and pelvis 12/30/2009  FINDINGS: Normal signal is present in the conus medullaris which terminates at L1, within normal limits. T1 marrow signal is diffusely depressed. Template edematous endplate changes are present at L5-S1. No other focal signal changes are present. Vertebral body heights and  alignment are normal.  Limited imaging of the abdomen is unremarkable.  The disc levels at L3-4 and above scratch the disc levels at L2-3 and above are normal.  L3-4: Mild leftward disc bulging is present without significant stenosis.  L4-5: A leftward disc protrusion is present. There is an annular tear. Mild subarticular narrowing is worse left than right. Mild foraminal narrowing is present  bilaterally as well.  L5-S1: A leftward disc protrusion is present. Prominent epidural fat is evident. The disc protrudes anteriorly as well. The combination results in mild left subarticular narrowing. The foramina are patent.  IMPRESSION: 1. Leftward disc protrusion and prominent epidural fat at L5-S1 with mild left subarticular stenosis. 2. Leftward disc protrusion and annular tear at L4-5 with mild subarticular stenosis bilaterally, left greater than right. 3. Mild foraminal narrowing bilaterally at L4-5 as well.   Electronically Signed   By: Lawrence Santiago M.D.   On: 10/24/2014 16:11   US Renal  10/25/2014   CLINICAL DATA:  Evaluate apparent lesion arising from the native right kidney. Personal history of renal transplant. Initial encounter.  EXAM: RENAL/URINARY TRACT ULTRASOUND COMPLETE  COMPARISON:  CT of the abdomen and pelvis performed 12/30/2009  FINDINGS: Right Kidney:  Length: 10.7 cm. Diffusely increased parenchymal echogenicity noted. Marked cortical thinning is seen. A 4.2 x 3.7 x 3.3 cm cyst is noted arising at the interpole region of the right kidney, simple in appearance. No hydronephrosis visualized.  Left Kidney:  Length: 9.1 cm. Diffusely increased parenchymal echogenicity noted. Marked cortical thinning is seen. No mass or hydronephrosis visualized.  Bladder:  Significantly distended and grossly unremarkable in appearance.  IMPRESSION: 1. 4.2 cm simple cyst at the interpole region of the right kidney. No suspicious masses seen. 2. Diffusely increased parenchymal echogenicity and marked bilateral native renal cortical thinning, compatible with the patient's known chronic native renal failure. The transplant kidney at the right iliac fossa is not assessed on this study. Per discussion with the clinical team, the only clinical question is in regard to the native right renal cyst, and thus assessment of the transplant kidney is not currently requested. 3. The bladder is significantly distended  but otherwise grossly unremarkable. The patient does not describe any urinary urgency at this time; would consider clinical correlation for urinary retention.   Electronically Signed   By: Garald Balding M.D.   On: 10/25/2014 07:19   Ir Epidurography  10/29/2014   CLINICAL DATA:  Low back pain. No significant radicular component. No previous lumbar surgery. Disc protrusions L4-5, L5-S1 on recent MR.  FLUOROSCOPY TIME:  12 seconds  PROCEDURE: LUMBAR EPIDURAL INJECTION:  An interlaminar approach was performed on the left at L4-5. The overlying skin was cleansed and anesthetized. A 20 gauge spinal needle was advanced using loss-of-resistance technique. Injection of 2cc of Omnipaque 180 confirmed epidural placement. There was no evidence for intravascular or intrathecal spread of contrast.  I then injected 120 mg of Depo-Medrol and 74ml of 1% lidocaine. The patient tolerated the procedure without evidence for complication. The patient was observed for 20 minutes returned to the floor in stable neurologic condition.  IMPRESSION: Technically successful interlaminar epidural steroid injection on the left at L4-5.   Electronically Signed   By: Arne Cleveland M.D.   On: 10/29/2014 13:31   Hospital records reviewed  Assessment/Plan    ICD-9-CM ICD-10-CM   1. Back pain at L4-L5 level- s/p epidural with continued uncontrolled pain 724.2 M54.5   2. Essential hypertension- stable 401.9 I10  3. Diabetic peripheral neuropathy - stable 250.60 E13.42    357.2 G62.9   4. Anxiety state- controlled 300.00 F41.1   5. Renal transplant, status post- stable V42.0 Z94.0   6. Urine retention- resolved 788.20 R33.9    --pain is uncontrolled- obtain Ortho eval as soon as possible. D/c norco as it is ineffective. Rx Roxanol 20mg /ml give 5mg  q4hrs prn breakthrough pain and hold for sedation.   --continue all other medications as ordered  --PT/OT as tolerated  --f/u with nephrology for mx of renal tx as scheduled  GOAL:  short term rehab, pain control and d/c home once medically appropriate.  COMMUNICIATION: discussed with pt and nursing staff above plan  Rayla Pember S. Perlie Gold  [facility] [address] [place], [place] [address] [phone] Office (Wednesdays and Fridays 8 AM - 5 PM) [phone] Cell (Monday-Friday 8 AM - 5 PM)

## 2014-11-04 ENCOUNTER — Encounter: Payer: Self-pay | Admitting: Internal Medicine

## 2014-11-06 ENCOUNTER — Non-Acute Institutional Stay (SKILLED_NURSING_FACILITY): Payer: Medicare Other | Admitting: Adult Health

## 2014-11-06 DIAGNOSIS — M545 Low back pain, unspecified: Secondary | ICD-10-CM

## 2014-11-06 DIAGNOSIS — E1165 Type 2 diabetes mellitus with hyperglycemia: Secondary | ICD-10-CM

## 2014-11-06 DIAGNOSIS — E1129 Type 2 diabetes mellitus with other diabetic kidney complication: Secondary | ICD-10-CM | POA: Diagnosis not present

## 2014-11-06 DIAGNOSIS — IMO0002 Reserved for concepts with insufficient information to code with codable children: Secondary | ICD-10-CM

## 2014-11-21 ENCOUNTER — Ambulatory Visit
Admission: RE | Admit: 2014-11-21 | Discharge: 2014-11-21 | Disposition: A | Discharge status: Home / Self Care | Payer: Medicare Other | Source: Ambulatory Visit | Attending: Specialist | Admitting: Specialist

## 2014-11-21 ENCOUNTER — Other Ambulatory Visit: Payer: Self-pay | Admitting: Specialist

## 2014-11-21 DIAGNOSIS — M10362 Gout due to renal impairment, left knee: Secondary | ICD-10-CM | POA: Diagnosis not present

## 2014-11-21 DIAGNOSIS — M10311 Gout due to renal impairment, right shoulder: Secondary | ICD-10-CM | POA: Diagnosis not present

## 2014-11-21 DIAGNOSIS — M545 Low back pain: Secondary | ICD-10-CM

## 2014-11-21 DIAGNOSIS — R609 Edema, unspecified: Secondary | ICD-10-CM | POA: Diagnosis not present

## 2014-11-22 ENCOUNTER — Emergency Department (HOSPITAL_COMMUNITY)
Admission: EM | Admit: 2014-11-22 | Discharge: 2014-11-22 | Disposition: A | Discharge status: Other Acute Inpatient Hospital | Payer: Medicare Other | Attending: Emergency Medicine | Admitting: Emergency Medicine

## 2014-11-22 ENCOUNTER — Encounter (HOSPITAL_COMMUNITY): Payer: Self-pay | Admitting: Emergency Medicine

## 2014-11-22 DIAGNOSIS — Z9889 Other specified postprocedural states: Secondary | ICD-10-CM | POA: Diagnosis not present

## 2014-11-22 DIAGNOSIS — M4646 Discitis, unspecified, lumbar region: Secondary | ICD-10-CM | POA: Diagnosis not present

## 2014-11-22 DIAGNOSIS — Z791 Long term (current) use of non-steroidal anti-inflammatories (NSAID): Secondary | ICD-10-CM | POA: Diagnosis not present

## 2014-11-22 DIAGNOSIS — E119 Type 2 diabetes mellitus without complications: Secondary | ICD-10-CM | POA: Insufficient documentation

## 2014-11-22 DIAGNOSIS — R7881 Bacteremia: Secondary | ICD-10-CM | POA: Diagnosis not present

## 2014-11-22 DIAGNOSIS — E785 Hyperlipidemia, unspecified: Secondary | ICD-10-CM | POA: Diagnosis present

## 2014-11-22 DIAGNOSIS — Z794 Long term (current) use of insulin: Secondary | ICD-10-CM | POA: Diagnosis not present

## 2014-11-22 DIAGNOSIS — Z79899 Other long term (current) drug therapy: Secondary | ICD-10-CM | POA: Diagnosis not present

## 2014-11-22 DIAGNOSIS — I493 Ventricular premature depolarization: Secondary | ICD-10-CM | POA: Diagnosis not present

## 2014-11-22 DIAGNOSIS — M545 Low back pain: Secondary | ICD-10-CM | POA: Diagnosis present

## 2014-11-22 DIAGNOSIS — F40240 Claustrophobia: Secondary | ICD-10-CM | POA: Diagnosis present

## 2014-11-22 DIAGNOSIS — N186 End stage renal disease: Secondary | ICD-10-CM | POA: Diagnosis not present

## 2014-11-22 DIAGNOSIS — Z6835 Body mass index (BMI) 35.0-35.9, adult: Secondary | ICD-10-CM | POA: Diagnosis not present

## 2014-11-22 DIAGNOSIS — Z8249 Family history of ischemic heart disease and other diseases of the circulatory system: Secondary | ICD-10-CM | POA: Diagnosis not present

## 2014-11-22 DIAGNOSIS — M6790 Unspecified disorder of synovium and tendon, unspecified site: Secondary | ICD-10-CM | POA: Diagnosis not present

## 2014-11-22 DIAGNOSIS — Z940 Kidney transplant status: Secondary | ICD-10-CM | POA: Diagnosis not present

## 2014-11-22 DIAGNOSIS — Z7982 Long term (current) use of aspirin: Secondary | ICD-10-CM | POA: Diagnosis not present

## 2014-11-22 DIAGNOSIS — I351 Nonrheumatic aortic (valve) insufficiency: Secondary | ICD-10-CM | POA: Diagnosis not present

## 2014-11-22 DIAGNOSIS — I330 Acute and subacute infective endocarditis: Secondary | ICD-10-CM | POA: Diagnosis not present

## 2014-11-22 DIAGNOSIS — I509 Heart failure, unspecified: Secondary | ICD-10-CM | POA: Diagnosis not present

## 2014-11-22 DIAGNOSIS — I10 Essential (primary) hypertension: Secondary | ICD-10-CM | POA: Diagnosis not present

## 2014-11-22 DIAGNOSIS — J449 Chronic obstructive pulmonary disease, unspecified: Secondary | ICD-10-CM | POA: Diagnosis not present

## 2014-11-22 DIAGNOSIS — M4649 Discitis, unspecified, multiple sites in spine: Secondary | ICD-10-CM | POA: Diagnosis present

## 2014-11-22 DIAGNOSIS — Z833 Family history of diabetes mellitus: Secondary | ICD-10-CM | POA: Diagnosis not present

## 2014-11-22 DIAGNOSIS — E669 Obesity, unspecified: Secondary | ICD-10-CM | POA: Diagnosis not present

## 2014-11-22 DIAGNOSIS — N189 Chronic kidney disease, unspecified: Secondary | ICD-10-CM | POA: Insufficient documentation

## 2014-11-22 DIAGNOSIS — Z880 Allergy status to penicillin: Secondary | ICD-10-CM | POA: Insufficient documentation

## 2014-11-22 DIAGNOSIS — Z792 Long term (current) use of antibiotics: Secondary | ICD-10-CM | POA: Diagnosis not present

## 2014-11-22 DIAGNOSIS — N400 Enlarged prostate without lower urinary tract symptoms: Secondary | ICD-10-CM | POA: Diagnosis present

## 2014-11-22 DIAGNOSIS — Z9849 Cataract extraction status, unspecified eye: Secondary | ICD-10-CM | POA: Diagnosis not present

## 2014-11-22 DIAGNOSIS — M4640 Discitis, unspecified, site unspecified: Secondary | ICD-10-CM | POA: Diagnosis not present

## 2014-11-22 DIAGNOSIS — M4626 Osteomyelitis of vertebra, lumbar region: Secondary | ICD-10-CM | POA: Diagnosis not present

## 2014-11-22 DIAGNOSIS — I120 Hypertensive chronic kidney disease with stage 5 chronic kidney disease or end stage renal disease: Secondary | ICD-10-CM | POA: Diagnosis not present

## 2014-11-22 DIAGNOSIS — Z936 Other artificial openings of urinary tract status: Secondary | ICD-10-CM | POA: Diagnosis not present

## 2014-11-22 DIAGNOSIS — I361 Nonrheumatic tricuspid (valve) insufficiency: Secondary | ICD-10-CM | POA: Diagnosis not present

## 2014-11-22 DIAGNOSIS — M4627 Osteomyelitis of vertebra, lumbosacral region: Secondary | ICD-10-CM | POA: Diagnosis present

## 2014-11-22 DIAGNOSIS — I129 Hypertensive chronic kidney disease with stage 1 through stage 4 chronic kidney disease, or unspecified chronic kidney disease: Secondary | ICD-10-CM | POA: Diagnosis not present

## 2014-11-22 DIAGNOSIS — E11319 Type 2 diabetes mellitus with unspecified diabetic retinopathy without macular edema: Secondary | ICD-10-CM | POA: Insufficient documentation

## 2014-11-22 DIAGNOSIS — E876 Hypokalemia: Secondary | ICD-10-CM | POA: Diagnosis not present

## 2014-11-22 DIAGNOSIS — I358 Other nonrheumatic aortic valve disorders: Secondary | ICD-10-CM | POA: Diagnosis not present

## 2014-11-22 DIAGNOSIS — Z993 Dependence on wheelchair: Secondary | ICD-10-CM | POA: Diagnosis not present

## 2014-11-22 DIAGNOSIS — M25511 Pain in right shoulder: Secondary | ICD-10-CM | POA: Diagnosis not present

## 2014-11-22 DIAGNOSIS — B952 Enterococcus as the cause of diseases classified elsewhere: Secondary | ICD-10-CM | POA: Diagnosis not present

## 2014-11-22 DIAGNOSIS — F411 Generalized anxiety disorder: Secondary | ICD-10-CM | POA: Diagnosis not present

## 2014-11-22 DIAGNOSIS — E1142 Type 2 diabetes mellitus with diabetic polyneuropathy: Secondary | ICD-10-CM | POA: Diagnosis present

## 2014-11-22 DIAGNOSIS — Z452 Encounter for adjustment and management of vascular access device: Secondary | ICD-10-CM | POA: Diagnosis not present

## 2014-11-22 DIAGNOSIS — I38 Endocarditis, valve unspecified: Secondary | ICD-10-CM | POA: Diagnosis not present

## 2014-11-22 DIAGNOSIS — F419 Anxiety disorder, unspecified: Secondary | ICD-10-CM | POA: Diagnosis not present

## 2014-11-22 DIAGNOSIS — M4807 Spinal stenosis, lumbosacral region: Secondary | ICD-10-CM | POA: Diagnosis present

## 2014-11-22 DIAGNOSIS — E1122 Type 2 diabetes mellitus with diabetic chronic kidney disease: Secondary | ICD-10-CM | POA: Diagnosis not present

## 2014-11-22 LAB — BASIC METABOLIC PANEL
Anion gap: 8 (ref 5–15)
BUN: 46 mg/dL — ABNORMAL HIGH (ref 6–23)
CO2: 29 mmol/L (ref 19–32)
Calcium: 8.7 mg/dL (ref 8.4–10.5)
Chloride: 102 mmol/L (ref 96–112)
Creatinine, Ser: 2.09 mg/dL — ABNORMAL HIGH (ref 0.50–1.35)
GFR calc Af Amer: 41 mL/min — ABNORMAL LOW (ref >90)
GFR calc non Af Amer: 35 mL/min — ABNORMAL LOW (ref >90)
Glucose, Bld: 200 mg/dL — ABNORMAL HIGH (ref 70–99)
Potassium: 4.6 mmol/L (ref 3.5–5.1)
Sodium: 139 mmol/L (ref 135–145)

## 2014-11-22 LAB — CBC WITH DIFFERENTIAL/PLATELET
Basophils Absolute: 0 10*3/uL (ref 0.0–0.1)
Basophils Relative: 0 % (ref 0–1)
Eosinophils Absolute: 0 10*3/uL (ref 0.0–0.7)
Eosinophils Relative: 0 % (ref 0–5)
HCT: 34.1 % — ABNORMAL LOW (ref 39.0–52.0)
Hemoglobin: 10 g/dL — ABNORMAL LOW (ref 13.0–17.0)
Lymphocytes Relative: 6 % — ABNORMAL LOW (ref 12–46)
Lymphs Abs: 0.4 10*3/uL — ABNORMAL LOW (ref 0.7–4.0)
MCH: 24 pg — ABNORMAL LOW (ref 26.0–34.0)
MCHC: 29.3 g/dL — ABNORMAL LOW (ref 30.0–36.0)
MCV: 82 fL (ref 78.0–100.0)
Monocytes Absolute: 0.4 10*3/uL (ref 0.1–1.0)
Monocytes Relative: 7 % (ref 3–12)
Neutro Abs: 5.5 10*3/uL (ref 1.7–7.7)
Neutrophils Relative %: 87 % — ABNORMAL HIGH (ref 43–77)
Platelets: 281 10*3/uL (ref 150–400)
RBC: 4.16 MIL/uL — ABNORMAL LOW (ref 4.22–5.81)
RDW: 14.9 % (ref 11.5–15.5)
WBC: 6.3 10*3/uL (ref 4.0–10.5)

## 2014-11-22 LAB — I-STAT CHEM 8, ED
BUN: 41 mg/dL — ABNORMAL HIGH (ref 6–23)
Calcium, Ion: 1.19 mmol/L (ref 1.12–1.23)
Chloride: 100 mmol/L (ref 96–112)
Creatinine, Ser: 1.9 mg/dL — ABNORMAL HIGH (ref 0.50–1.35)
Glucose, Bld: 200 mg/dL — ABNORMAL HIGH (ref 70–99)
HCT: 35 % — ABNORMAL LOW (ref 39.0–52.0)
Hemoglobin: 11.9 g/dL — ABNORMAL LOW (ref 13.0–17.0)
Potassium: 4.6 mmol/L (ref 3.5–5.1)
Sodium: 139 mmol/L (ref 135–145)
TCO2: 25 mmol/L (ref 0–100)

## 2014-11-22 MED ORDER — SODIUM CHLORIDE 0.9 % IV SOLN
2500.0000 mg | INTRAVENOUS | Status: DC
  Filled 2014-11-22: qty 2500

## 2014-11-22 NOTE — ED Notes (Signed)
Resting quietly with eye closed. Easily arousable. Verbally responsive. Resp even and unlabored. ABC's intact. NAD noted.  

## 2014-11-22 NOTE — ED Provider Notes (Signed)
CSN: [id number]     Arrival date & time 11/22/14  1112 History   First MD Initiated Contact with Patient 11/22/14 1134     Chief Complaint  Patient presents with  . Back Pain     (Consider location/radiation/quality/duration/timing/severity/associated sxs/prior Treatment) HPI Comments: Patient presents to the emergency department with chief complaint of discitis. He states that he was sent here by Dr. Louanne Skye, who received MRI results from a study yesterday that shows discitis versus osteomyelitis with endplate destruction at L5-S1. Patient is a renal transplant patient. He was transplanted at [facility] in 2009. He is followed by Dr. Lorrene Reid of [facility] in [place]. Patient was recently admitted on 1/13 for AK I and back pain. It was discovered that the patient had urinary retention, which was thought to be secondary to surgical manipulation of the urethra. Ultimately, neurosurgery was consulted to rule out cauda equina. MRI was obtained during that admission which showed a small protrusion at L4/L5 and L5/S1.  He was advised that no surgical action be taken, but the patient undergo epidural injection by interventional radiology. It is also noted that the patient had a positive urine culture for enterococcus during this hospitalization. Patient states that he has had persistent back pain. Is worsened with movement. Nothing makes it better.  The history is provided by the patient. No language interpreter was used.    Past Medical History  Diagnosis Date  . Diabetes mellitus   . Hypertension   . Leg pain 02/05/11    with walking  . Weight gain   . Shortness of breath     when lying flat  . Diminished eyesight   . Hearing difficulty   . Chronic kidney disease     Dia;ysis MWF  . Anemia   . Nervousness(799.21)   . COPD (chronic obstructive pulmonary disease)   . CHF (congestive heart failure)   . Posttraumatic stress disorder   . Allergic rhinitis   . Dyslipidemia   .  Morbid obesity   . Diabetic peripheral neuropathy   . Gait disorder   . Obstructive sleep apnea   . Bilateral carpal tunnel syndrome   . Anxiety disorder   . Gastroparesis   . Gout   . Diabetic retinopathy   . Renal transplant, status post 04/05/2014  . Incomplete bladder emptying   . Urethral stricture    Past Surgical History  Procedure Laterality Date  . Kidney transplant  2009  . Cataract extraction w/ intraocular lens  implant, bilateral Bilateral   . Av fistula placement Bilateral   . Arteriovenous graft placement Bilateral "several"  . Cystoscopy w/ internal urethrotomy     Family History  Problem Relation Age of Onset  . Hypertension Mother   . Diabetes Father    History  Substance Use Topics  . Smoking status: Never Smoker   . Smokeless tobacco: Never Used  . Alcohol Use: No    Review of Systems  Constitutional: Negative for fever and chills.  Respiratory: Negative for shortness of breath.   Cardiovascular: Negative for chest pain.  Gastrointestinal: Negative for nausea, vomiting, diarrhea and constipation.  Genitourinary: Negative for dysuria.  Musculoskeletal: Positive for back pain.  All other systems reviewed and are negative.     Allergies  Penicillins; Pork-derived products; and Ace inhibitors  Home Medications   Prior to Admission medications   Medication Sig Start Date End Date Taking? Authorizing Provider  allopurinol (ZYLOPRIM) 100 MG tablet Take 100 mg by mouth daily. Take with  the 300mg  tablet to make a total of 400mg  daily per patient    Historical Provider, MD  allopurinol (ZYLOPRIM) 300 MG tablet Take 300 mg by mouth daily. Take with the 100mg  tablet to make a total dose of 400mg  a day per patient    Historical Provider, MD  amLODipine (NORVASC) 5 MG tablet Take 1 tablet (5 mg total) by mouth daily. 10/30/14   Leeanne Rio, MD  aspirin 325 MG tablet Take 325 mg by mouth daily.     Historical Provider, MD  baclofen (LIORESAL) 10 MG  tablet Take 0.5 tablets (5 mg total) by mouth 3 (three) times daily. 10/30/14   Leeanne Rio, MD  calcitRIOL (ROCALTROL) 0.5 MCG capsule Take 0.5 mcg by mouth at bedtime.     Historical Provider, MD  clonazePAM (KLONOPIN) 0.5 MG tablet Take 0.5 mg by mouth 3 (three) times daily as needed for anxiety.     Historical Provider, MD  feeding supplement, ENSURE COMPLETE, (ENSURE COMPLETE) LIQD Take 237 mLs by mouth 2 (two) times daily between meals. 10/30/14   Leeanne Rio, MD  HYDROcodone-acetaminophen (NORCO/VICODIN) 5-325 MG per tablet Take 1-2 tablets by mouth every 6 (six) hours as needed for moderate pain or severe pain. 10/30/14   Leeanne Rio, MD  insulin aspart (NOVOLOG) 100 UNIT/ML injection Inject 0-15 Units into the skin 3 (three) times daily with meals. CBG < 70: implement hypoglycemia protocol CBG 70 - 120: 0 units CBG 121 - 150: 2 units CBG 151 - 200: 3 units CBG 201 - 250: 5 units CBG 251 - 300: 8 units CBG 301 - 350: 11 units CBG 351 - 400: 15 units CBG > 400: call MD 10/30/14   Leeanne Rio, MD  insulin glargine (LANTUS) 100 UNIT/ML injection Inject 0.4 mLs (40 Units total) into the skin daily. 10/30/14   Leeanne Rio, MD  mycophenolate (CELLCEPT) 250 MG capsule Take 750 mg by mouth 2 (two) times daily.     Historical Provider, MD  omeprazole (PRILOSEC) 20 MG capsule Take 20 mg by mouth daily.    Historical Provider, MD  predniSONE (DELTASONE) 5 MG tablet Take 5 mg by mouth daily.      Historical Provider, MD  rosuvastatin (CRESTOR) 20 MG tablet Take 20 mg by mouth at bedtime.     Historical Provider, MD  tacrolimus (PROGRAF) 0.5 MG capsule Take 1 mg by mouth 2 (two) times daily.     Historical Provider, MD   There were no vitals taken for this visit. Physical Exam  Constitutional: He is oriented to person, place, and time. He appears well-developed and well-nourished.  The patient is alert and oriented  HENT:  Head: Normocephalic and atraumatic.   Eyes: Conjunctivae and EOM are normal. Pupils are equal, round, and reactive to light. Right eye exhibits no discharge. Left eye exhibits no discharge. No scleral icterus.  Neck: Normal range of motion. Neck supple. No JVD present.  Cardiovascular: Normal rate, regular rhythm and normal heart sounds.  Exam reveals no gallop and no friction rub.   No murmur heard. Pulmonary/Chest: Effort normal and breath sounds normal. No respiratory distress. He has no wheezes. He has no rales. He exhibits no tenderness.  Abdominal: Soft. He exhibits no distension and no mass. There is no tenderness. There is no rebound and no guarding.  No focal abdominal tenderness, no RLQ tenderness or pain at McBurney's point, no RUQ tenderness or Murphy's sign, no left-sided abdominal tenderness, no fluid wave,  or signs of peritonitis   Musculoskeletal: Normal range of motion. He exhibits no edema or tenderness.  Range of motion and strength lower extremities mildly limited secondary to pain and body habitus, but no obvious bony abnormality or deformity  Neurological: He is alert and oriented to person, place, and time.  Sensation and strength grossly intact  Skin: Skin is warm and dry.  No evidence of abscess or cellulitis  Psychiatric: He has a normal mood and affect. His behavior is normal. Judgment and thought content normal.  Nursing note and vitals reviewed.   ED Course  Procedures (including critical care time) Labs Review Labs Reviewed  CULTURE, BLOOD (ROUTINE X 2)  CULTURE, BLOOD (ROUTINE X 2)  CBC WITH DIFFERENTIAL/PLATELET  BASIC METABOLIC PANEL  I-STAT CG4 LACTIC ACID, ED  I-STAT CHEM 8, ED    Imaging Review Mr Lumbar Spine Wo Contrast  11/22/2014   CLINICAL DATA:  Increasingly severe low back pain. Status post renal transplant. Diabetic. Subsequent encounter.  EXAM: MRI LUMBAR SPINE WITHOUT CONTRAST  TECHNIQUE: Multiplanar, multisequence MR imaging of the lumbar spine was performed. No intravenous  contrast was administered.  COMPARISON:  MRI lumbar spine 10/24/2014.  FINDINGS: Segmentation: Normal.  Alignment:  Normal.  Vertebrae: Abnormal bone marrow edema in the L5 and S1 vertebral bodies accompanied by a T2 hyperintense L5-S1 disc. Early endplate destruction antero-inferiorly of L5, with increasing coalescence of an osteonecrotic cavity inferiorly. Findings consistent with L5-S1 diskitis and adjacent L5 and S1 osteomyelitis.  Conus medullaris: Normal in size, signal, and location.  Paraspinal tissues: No evidence for hydronephrosis or paravertebral mass. Small shrunken kidneys reflect chronic ESRD with a large simple cyst on the RIGHT, stable. No paravertebral or psoas abscess.  Disc levels:  The L1-2, L2-3, and L3-4 interspaces are normal.  The L4-5 level demonstrates desiccation with a small annular rent central and to the LEFT, noncompressive. Mild subarticular zone and foraminal zone narrowing does not clearly affect the exiting nerve roots.  At L5-S1, there is a central and leftward disc protrusion. The disc is abnormal displaying T2 hyperintensity. Prominent epidural fat is again demonstrated, up to 7 mm thick at the L5-S1 level. Heterogeneity of the typical lipid associated T1 shortening of the ventral epidural fat probably reflects developing phlegmon. No definite epidural abscess on this noncontrast exam. This could affect either S1 nerve root. Inflammatory change probably extends into both neural foramina, clearly worse on the LEFT.  IMPRESSION: Progressive endplate destruction, bone marrow edema, and L5-S1 discal hyperintensity along with epidural phlegmon. The findings are consistent with L5-S1 diskitis and adjacent osteomyelitis.  I discussed these findings with the ordering provider at the time interpretation on 11/22/2014 at 8:45 a.m.   Electronically Signed   By: Rolla Flatten M.D.   On: 11/22/2014 09:58     EKG Interpretation None      MDM   Final diagnoses:  Discitis of lumbar  region    Patient with discitis vs osteomyelitis.  Patient does not meet SIRS.  Patient discussed with Dr. Lorrene Reid of Nephrology in [place], who recommends tx to [facility].  Patient will need a tissue sample prior to starting abx.  Seen by and discussed with Dr. Rogene Houston.  Will transfer to [facility].     Dr. Joseph Berkshire of Nephrology at [facility] will be the accepting physician.    Montine Circle, PA-C 11/22/14 1317  Fredia Sorrow, MD 11/22/14 1343

## 2014-11-22 NOTE — ED Notes (Signed)
Pt arrived via EMS with c/o lower back, lt knee, and rt shoulder pain. Pt reported poor appetite and decrease po intake. Seen Dr. Louanne Skye yesterday and had fld drawn off lt knee, blood drawn and MRI test done with results CRP's-8.4, BUN-38, Creatinine-2.5, and Sed Rate-76. Sent here from [place] office

## 2014-11-22 NOTE — ED Notes (Signed)
Awake. Verbally responsive. A/O x4. Resp even and unlabored. No audible adventitious breath sounds noted. ABC's intact. IV saline lock patent and intact. 

## 2014-11-22 NOTE — ED Notes (Signed)
Awake. Verbally responsive. A/O x4. Resp even and unlabored. No audible adventitious breath sounds noted. ABC's intact.  

## 2014-11-22 NOTE — ED Notes (Addendum)
Pt reported feeling nausea without emesis. Pt given diet gingerale and crackers.

## 2014-11-22 NOTE — ED Notes (Signed)
Bed: [id number] Expected date:  Expected time:  Means of arrival:  Comments: BUN/creatinine elevated

## 2014-11-22 NOTE — ED Notes (Signed)
[facility] arrived and given report to transport pt to [facility].

## 2014-11-23 ENCOUNTER — Telehealth: Payer: Self-pay | Admitting: *Deleted

## 2014-11-23 ENCOUNTER — Telehealth (HOSPITAL_COMMUNITY): Payer: Self-pay

## 2014-11-23 NOTE — Telephone Encounter (Signed)
Pt transferred to [facility]. cx results faxed to [facility] sec. on unit pt is on @ [phone].

## 2014-11-25 LAB — CULTURE, BLOOD (ROUTINE X 2)

## 2014-11-28 DIAGNOSIS — I38 Endocarditis, valve unspecified: Secondary | ICD-10-CM

## 2014-11-28 HISTORY — DX: Endocarditis, valve unspecified: I38

## 2014-11-29 NOTE — Progress Notes (Signed)
Patient ID: [name] [name], male   DOB: [birth year], 49 y.o.   MRN: [id number]  [facility]     Allergies  Allergen Reactions  . Penicillins Rash and Other (See Comments)    fever  . Pork-Derived Products Anaphylaxis and Other (See Comments)    Fever   . Ace Inhibitors Cough       Chief Complaint  Patient presents with  . Acute Visit    nausea; back pain high cbg's     HPI:  He is having nausea without vomiting; his appetite is decreased. His cbg readings are elevated 200-400. He is having back pain. He does not want to answer questions today; stating he does not feel good.  Staff reports that he is more lethargic and confused and feel as though this is due to medications.    Past Medical History  Diagnosis Date  . Diabetes mellitus   . Hypertension   . Leg pain 02/05/11    with walking  . Weight gain   . Shortness of breath     when lying flat  . Diminished eyesight   . Hearing difficulty   . Chronic kidney disease     Dia;ysis MWF  . Anemia   . Nervousness(799.21)   . COPD (chronic obstructive pulmonary disease)   . CHF (congestive heart failure)   . Posttraumatic stress disorder   . Allergic rhinitis   . Dyslipidemia   . Morbid obesity   . Diabetic peripheral neuropathy   . Gait disorder   . Obstructive sleep apnea   . Bilateral carpal tunnel syndrome   . Anxiety disorder   . Gastroparesis   . Gout   . Diabetic retinopathy   . Renal transplant, status post 04/05/2014  . Incomplete bladder emptying   . Urethral stricture     Past Surgical History  Procedure Laterality Date  . Kidney transplant  2009  . Cataract extraction w/ intraocular lens  implant, bilateral Bilateral   . Av fistula placement Bilateral   . Arteriovenous graft placement Bilateral "several"  . Cystoscopy w/ internal urethrotomy      VITAL SIGNS BP 142/74 mmHg  Pulse 83  Ht 6' (1.829 m)  Wt 258 lb (117.028 kg)  BMI 34.98 kg/m2   Outpatient Encounter Prescriptions as of  11/06/2014  Medication Sig  Allopurinol 400 mg daily norvasc 5 mg daily Asa 325 mg daily Baclofen 5 mg three times daily Calcitriol 0.5 mcg hs cellcept 750 mg twice daily  Klonopin 0.5 mg tid prn crestor 20 mg daily  lantus 40 units daily roxanol 5 mg every 4 hours as needed  oxycontin 20 mg every 12 hours  Prednisone 5 mg daily  prilosec 20 mg daily  Tacrolimus 1 mg twice daily      SIGNIFICANT DIAGNOSTIC EXAMS    Review of Systems  Unable to perform ROS    Physical Exam  Constitutional: No distress.  Overweight   Neck: Neck supple. No JVD present.  Cardiovascular: Normal rate, regular rhythm and intact distal pulses.   Respiratory: Effort normal and breath sounds normal. No respiratory distress.  GI: Soft. Bowel sounds are normal. He exhibits no distension.  Has diffuse mild tenderness present.   Genitourinary: Guaiac negative stool.  Musculoskeletal: He exhibits no edema.  Is able to move extremities   Neurological: He is alert.  Skin: Skin is warm and dry. He is not diaphoretic.       ASSESSMENT/ PLAN:  1. Diabetes 2. Back pain 3. Nausea Will  increase lantus to 45 units daily; will change the novolog to 5 units prior to meals with an additional 5 units for cbg >=150; will setup a kub due to his nausea; will begin lidoderm patches X2 to his lower back for pain. Due to his increased lethargy; will lower the oxycontin to 10 mg twice daily and will monitor    Ok Edwards NP [facility]  Contact [phone] Monday through Friday 8am- 5pm  After hours call [phone]

## 2014-12-04 DIAGNOSIS — M4640 Discitis, unspecified, site unspecified: Secondary | ICD-10-CM | POA: Diagnosis not present

## 2014-12-04 DIAGNOSIS — I120 Hypertensive chronic kidney disease with stage 5 chronic kidney disease or end stage renal disease: Secondary | ICD-10-CM | POA: Diagnosis not present

## 2014-12-04 DIAGNOSIS — E1142 Type 2 diabetes mellitus with diabetic polyneuropathy: Secondary | ICD-10-CM | POA: Diagnosis not present

## 2014-12-04 DIAGNOSIS — I358 Other nonrheumatic aortic valve disorders: Secondary | ICD-10-CM | POA: Diagnosis not present

## 2014-12-04 DIAGNOSIS — Z452 Encounter for adjustment and management of vascular access device: Secondary | ICD-10-CM | POA: Diagnosis not present

## 2014-12-04 DIAGNOSIS — I38 Endocarditis, valve unspecified: Secondary | ICD-10-CM | POA: Insufficient documentation

## 2014-12-04 DIAGNOSIS — M4627 Osteomyelitis of vertebra, lumbosacral region: Secondary | ICD-10-CM | POA: Diagnosis not present

## 2014-12-05 DIAGNOSIS — I120 Hypertensive chronic kidney disease with stage 5 chronic kidney disease or end stage renal disease: Secondary | ICD-10-CM | POA: Diagnosis not present

## 2014-12-05 DIAGNOSIS — M4627 Osteomyelitis of vertebra, lumbosacral region: Secondary | ICD-10-CM | POA: Diagnosis not present

## 2014-12-05 DIAGNOSIS — I358 Other nonrheumatic aortic valve disorders: Secondary | ICD-10-CM | POA: Diagnosis not present

## 2014-12-05 DIAGNOSIS — E1142 Type 2 diabetes mellitus with diabetic polyneuropathy: Secondary | ICD-10-CM | POA: Diagnosis not present

## 2014-12-05 DIAGNOSIS — M4640 Discitis, unspecified, site unspecified: Secondary | ICD-10-CM | POA: Diagnosis not present

## 2014-12-05 DIAGNOSIS — Z452 Encounter for adjustment and management of vascular access device: Secondary | ICD-10-CM | POA: Diagnosis not present

## 2014-12-06 DIAGNOSIS — Z452 Encounter for adjustment and management of vascular access device: Secondary | ICD-10-CM | POA: Diagnosis not present

## 2014-12-06 DIAGNOSIS — M4627 Osteomyelitis of vertebra, lumbosacral region: Secondary | ICD-10-CM | POA: Diagnosis not present

## 2014-12-06 DIAGNOSIS — I120 Hypertensive chronic kidney disease with stage 5 chronic kidney disease or end stage renal disease: Secondary | ICD-10-CM | POA: Diagnosis not present

## 2014-12-06 DIAGNOSIS — E1142 Type 2 diabetes mellitus with diabetic polyneuropathy: Secondary | ICD-10-CM | POA: Diagnosis not present

## 2014-12-06 DIAGNOSIS — I358 Other nonrheumatic aortic valve disorders: Secondary | ICD-10-CM | POA: Diagnosis not present

## 2014-12-06 DIAGNOSIS — M4640 Discitis, unspecified, site unspecified: Secondary | ICD-10-CM | POA: Diagnosis not present

## 2014-12-07 DIAGNOSIS — M4640 Discitis, unspecified, site unspecified: Secondary | ICD-10-CM | POA: Diagnosis not present

## 2014-12-07 DIAGNOSIS — I120 Hypertensive chronic kidney disease with stage 5 chronic kidney disease or end stage renal disease: Secondary | ICD-10-CM | POA: Diagnosis not present

## 2014-12-07 DIAGNOSIS — M4627 Osteomyelitis of vertebra, lumbosacral region: Secondary | ICD-10-CM | POA: Diagnosis not present

## 2014-12-07 DIAGNOSIS — I358 Other nonrheumatic aortic valve disorders: Secondary | ICD-10-CM | POA: Diagnosis not present

## 2014-12-07 DIAGNOSIS — I330 Acute and subacute infective endocarditis: Secondary | ICD-10-CM | POA: Diagnosis not present

## 2014-12-07 DIAGNOSIS — Z452 Encounter for adjustment and management of vascular access device: Secondary | ICD-10-CM | POA: Diagnosis not present

## 2014-12-07 DIAGNOSIS — E1142 Type 2 diabetes mellitus with diabetic polyneuropathy: Secondary | ICD-10-CM | POA: Diagnosis not present

## 2014-12-08 DIAGNOSIS — I358 Other nonrheumatic aortic valve disorders: Secondary | ICD-10-CM | POA: Diagnosis not present

## 2014-12-08 DIAGNOSIS — Z452 Encounter for adjustment and management of vascular access device: Secondary | ICD-10-CM | POA: Diagnosis not present

## 2014-12-08 DIAGNOSIS — E1142 Type 2 diabetes mellitus with diabetic polyneuropathy: Secondary | ICD-10-CM | POA: Diagnosis not present

## 2014-12-08 DIAGNOSIS — M4640 Discitis, unspecified, site unspecified: Secondary | ICD-10-CM | POA: Diagnosis not present

## 2014-12-08 DIAGNOSIS — M4627 Osteomyelitis of vertebra, lumbosacral region: Secondary | ICD-10-CM | POA: Diagnosis not present

## 2014-12-08 DIAGNOSIS — I120 Hypertensive chronic kidney disease with stage 5 chronic kidney disease or end stage renal disease: Secondary | ICD-10-CM | POA: Diagnosis not present

## 2014-12-10 DIAGNOSIS — M4640 Discitis, unspecified, site unspecified: Secondary | ICD-10-CM | POA: Diagnosis not present

## 2014-12-10 DIAGNOSIS — I120 Hypertensive chronic kidney disease with stage 5 chronic kidney disease or end stage renal disease: Secondary | ICD-10-CM | POA: Diagnosis not present

## 2014-12-10 DIAGNOSIS — Z452 Encounter for adjustment and management of vascular access device: Secondary | ICD-10-CM | POA: Diagnosis not present

## 2014-12-10 DIAGNOSIS — I358 Other nonrheumatic aortic valve disorders: Secondary | ICD-10-CM | POA: Diagnosis not present

## 2014-12-10 DIAGNOSIS — E1142 Type 2 diabetes mellitus with diabetic polyneuropathy: Secondary | ICD-10-CM | POA: Diagnosis not present

## 2014-12-10 DIAGNOSIS — M4627 Osteomyelitis of vertebra, lumbosacral region: Secondary | ICD-10-CM | POA: Diagnosis not present

## 2014-12-12 DIAGNOSIS — I120 Hypertensive chronic kidney disease with stage 5 chronic kidney disease or end stage renal disease: Secondary | ICD-10-CM | POA: Diagnosis not present

## 2014-12-12 DIAGNOSIS — M4640 Discitis, unspecified, site unspecified: Secondary | ICD-10-CM | POA: Diagnosis not present

## 2014-12-12 DIAGNOSIS — M4627 Osteomyelitis of vertebra, lumbosacral region: Secondary | ICD-10-CM | POA: Diagnosis not present

## 2014-12-12 DIAGNOSIS — E1142 Type 2 diabetes mellitus with diabetic polyneuropathy: Secondary | ICD-10-CM | POA: Diagnosis not present

## 2014-12-12 DIAGNOSIS — I358 Other nonrheumatic aortic valve disorders: Secondary | ICD-10-CM | POA: Diagnosis not present

## 2014-12-12 DIAGNOSIS — Z452 Encounter for adjustment and management of vascular access device: Secondary | ICD-10-CM | POA: Diagnosis not present

## 2014-12-13 DIAGNOSIS — I120 Hypertensive chronic kidney disease with stage 5 chronic kidney disease or end stage renal disease: Secondary | ICD-10-CM | POA: Diagnosis not present

## 2014-12-13 DIAGNOSIS — M4627 Osteomyelitis of vertebra, lumbosacral region: Secondary | ICD-10-CM | POA: Diagnosis not present

## 2014-12-13 DIAGNOSIS — I330 Acute and subacute infective endocarditis: Secondary | ICD-10-CM | POA: Diagnosis not present

## 2014-12-13 DIAGNOSIS — M4640 Discitis, unspecified, site unspecified: Secondary | ICD-10-CM | POA: Diagnosis not present

## 2014-12-13 DIAGNOSIS — Z452 Encounter for adjustment and management of vascular access device: Secondary | ICD-10-CM | POA: Diagnosis not present

## 2014-12-13 DIAGNOSIS — E1142 Type 2 diabetes mellitus with diabetic polyneuropathy: Secondary | ICD-10-CM | POA: Diagnosis not present

## 2014-12-13 DIAGNOSIS — I358 Other nonrheumatic aortic valve disorders: Secondary | ICD-10-CM | POA: Diagnosis not present

## 2014-12-17 DIAGNOSIS — I358 Other nonrheumatic aortic valve disorders: Secondary | ICD-10-CM | POA: Diagnosis not present

## 2014-12-17 DIAGNOSIS — I120 Hypertensive chronic kidney disease with stage 5 chronic kidney disease or end stage renal disease: Secondary | ICD-10-CM | POA: Diagnosis not present

## 2014-12-17 DIAGNOSIS — E1142 Type 2 diabetes mellitus with diabetic polyneuropathy: Secondary | ICD-10-CM | POA: Diagnosis not present

## 2014-12-17 DIAGNOSIS — M4627 Osteomyelitis of vertebra, lumbosacral region: Secondary | ICD-10-CM | POA: Diagnosis not present

## 2014-12-17 DIAGNOSIS — M4640 Discitis, unspecified, site unspecified: Secondary | ICD-10-CM | POA: Diagnosis not present

## 2014-12-17 DIAGNOSIS — Z452 Encounter for adjustment and management of vascular access device: Secondary | ICD-10-CM | POA: Diagnosis not present

## 2014-12-19 DIAGNOSIS — I120 Hypertensive chronic kidney disease with stage 5 chronic kidney disease or end stage renal disease: Secondary | ICD-10-CM | POA: Diagnosis not present

## 2014-12-19 DIAGNOSIS — Z452 Encounter for adjustment and management of vascular access device: Secondary | ICD-10-CM | POA: Diagnosis not present

## 2014-12-19 DIAGNOSIS — M4640 Discitis, unspecified, site unspecified: Secondary | ICD-10-CM | POA: Diagnosis not present

## 2014-12-19 DIAGNOSIS — I358 Other nonrheumatic aortic valve disorders: Secondary | ICD-10-CM | POA: Diagnosis not present

## 2014-12-19 DIAGNOSIS — M4627 Osteomyelitis of vertebra, lumbosacral region: Secondary | ICD-10-CM | POA: Diagnosis not present

## 2014-12-19 DIAGNOSIS — E1142 Type 2 diabetes mellitus with diabetic polyneuropathy: Secondary | ICD-10-CM | POA: Diagnosis not present

## 2014-12-20 DIAGNOSIS — I330 Acute and subacute infective endocarditis: Secondary | ICD-10-CM | POA: Diagnosis not present

## 2014-12-20 DIAGNOSIS — Z452 Encounter for adjustment and management of vascular access device: Secondary | ICD-10-CM | POA: Diagnosis not present

## 2014-12-20 DIAGNOSIS — M4627 Osteomyelitis of vertebra, lumbosacral region: Secondary | ICD-10-CM | POA: Diagnosis not present

## 2014-12-20 DIAGNOSIS — I358 Other nonrheumatic aortic valve disorders: Secondary | ICD-10-CM | POA: Diagnosis not present

## 2014-12-20 DIAGNOSIS — I120 Hypertensive chronic kidney disease with stage 5 chronic kidney disease or end stage renal disease: Secondary | ICD-10-CM | POA: Diagnosis not present

## 2014-12-20 DIAGNOSIS — M4640 Discitis, unspecified, site unspecified: Secondary | ICD-10-CM | POA: Diagnosis not present

## 2014-12-20 DIAGNOSIS — E1142 Type 2 diabetes mellitus with diabetic polyneuropathy: Secondary | ICD-10-CM | POA: Diagnosis not present

## 2014-12-24 DIAGNOSIS — Z452 Encounter for adjustment and management of vascular access device: Secondary | ICD-10-CM | POA: Diagnosis not present

## 2014-12-24 DIAGNOSIS — I358 Other nonrheumatic aortic valve disorders: Secondary | ICD-10-CM | POA: Diagnosis not present

## 2014-12-24 DIAGNOSIS — M4627 Osteomyelitis of vertebra, lumbosacral region: Secondary | ICD-10-CM | POA: Diagnosis not present

## 2014-12-24 DIAGNOSIS — I120 Hypertensive chronic kidney disease with stage 5 chronic kidney disease or end stage renal disease: Secondary | ICD-10-CM | POA: Diagnosis not present

## 2014-12-24 DIAGNOSIS — E1142 Type 2 diabetes mellitus with diabetic polyneuropathy: Secondary | ICD-10-CM | POA: Diagnosis not present

## 2014-12-24 DIAGNOSIS — M4640 Discitis, unspecified, site unspecified: Secondary | ICD-10-CM | POA: Diagnosis not present

## 2014-12-27 DIAGNOSIS — E1142 Type 2 diabetes mellitus with diabetic polyneuropathy: Secondary | ICD-10-CM | POA: Diagnosis not present

## 2014-12-27 DIAGNOSIS — I120 Hypertensive chronic kidney disease with stage 5 chronic kidney disease or end stage renal disease: Secondary | ICD-10-CM | POA: Diagnosis not present

## 2014-12-27 DIAGNOSIS — M4627 Osteomyelitis of vertebra, lumbosacral region: Secondary | ICD-10-CM | POA: Diagnosis not present

## 2014-12-27 DIAGNOSIS — I358 Other nonrheumatic aortic valve disorders: Secondary | ICD-10-CM | POA: Diagnosis not present

## 2014-12-27 DIAGNOSIS — I330 Acute and subacute infective endocarditis: Secondary | ICD-10-CM | POA: Diagnosis not present

## 2014-12-27 DIAGNOSIS — M4640 Discitis, unspecified, site unspecified: Secondary | ICD-10-CM | POA: Diagnosis not present

## 2014-12-27 DIAGNOSIS — Z452 Encounter for adjustment and management of vascular access device: Secondary | ICD-10-CM | POA: Diagnosis not present

## 2014-12-31 DIAGNOSIS — M4627 Osteomyelitis of vertebra, lumbosacral region: Secondary | ICD-10-CM | POA: Diagnosis not present

## 2014-12-31 DIAGNOSIS — M4640 Discitis, unspecified, site unspecified: Secondary | ICD-10-CM | POA: Diagnosis not present

## 2014-12-31 DIAGNOSIS — I358 Other nonrheumatic aortic valve disorders: Secondary | ICD-10-CM | POA: Diagnosis not present

## 2014-12-31 DIAGNOSIS — Z452 Encounter for adjustment and management of vascular access device: Secondary | ICD-10-CM | POA: Diagnosis not present

## 2014-12-31 DIAGNOSIS — I120 Hypertensive chronic kidney disease with stage 5 chronic kidney disease or end stage renal disease: Secondary | ICD-10-CM | POA: Diagnosis not present

## 2014-12-31 DIAGNOSIS — E1142 Type 2 diabetes mellitus with diabetic polyneuropathy: Secondary | ICD-10-CM | POA: Diagnosis not present

## 2015-01-02 DIAGNOSIS — Z452 Encounter for adjustment and management of vascular access device: Secondary | ICD-10-CM | POA: Diagnosis not present

## 2015-01-02 DIAGNOSIS — M4640 Discitis, unspecified, site unspecified: Secondary | ICD-10-CM | POA: Diagnosis not present

## 2015-01-02 DIAGNOSIS — E1142 Type 2 diabetes mellitus with diabetic polyneuropathy: Secondary | ICD-10-CM | POA: Diagnosis not present

## 2015-01-02 DIAGNOSIS — M4627 Osteomyelitis of vertebra, lumbosacral region: Secondary | ICD-10-CM | POA: Diagnosis not present

## 2015-01-02 DIAGNOSIS — I358 Other nonrheumatic aortic valve disorders: Secondary | ICD-10-CM | POA: Diagnosis not present

## 2015-01-02 DIAGNOSIS — I120 Hypertensive chronic kidney disease with stage 5 chronic kidney disease or end stage renal disease: Secondary | ICD-10-CM | POA: Diagnosis not present

## 2015-01-03 DIAGNOSIS — A047 Enterocolitis due to Clostridium difficile: Secondary | ICD-10-CM | POA: Diagnosis not present

## 2015-01-03 DIAGNOSIS — E1142 Type 2 diabetes mellitus with diabetic polyneuropathy: Secondary | ICD-10-CM | POA: Diagnosis not present

## 2015-01-03 DIAGNOSIS — I120 Hypertensive chronic kidney disease with stage 5 chronic kidney disease or end stage renal disease: Secondary | ICD-10-CM | POA: Diagnosis not present

## 2015-01-03 DIAGNOSIS — M4640 Discitis, unspecified, site unspecified: Secondary | ICD-10-CM | POA: Diagnosis not present

## 2015-01-03 DIAGNOSIS — I358 Other nonrheumatic aortic valve disorders: Secondary | ICD-10-CM | POA: Diagnosis not present

## 2015-01-03 DIAGNOSIS — Z452 Encounter for adjustment and management of vascular access device: Secondary | ICD-10-CM | POA: Diagnosis not present

## 2015-01-03 DIAGNOSIS — M4627 Osteomyelitis of vertebra, lumbosacral region: Secondary | ICD-10-CM | POA: Diagnosis not present

## 2015-01-07 DIAGNOSIS — M4640 Discitis, unspecified, site unspecified: Secondary | ICD-10-CM | POA: Diagnosis not present

## 2015-01-07 DIAGNOSIS — B952 Enterococcus as the cause of diseases classified elsewhere: Secondary | ICD-10-CM | POA: Diagnosis not present

## 2015-01-07 DIAGNOSIS — M869 Osteomyelitis, unspecified: Secondary | ICD-10-CM | POA: Diagnosis not present

## 2015-01-07 DIAGNOSIS — I330 Acute and subacute infective endocarditis: Secondary | ICD-10-CM | POA: Diagnosis not present

## 2015-01-08 DIAGNOSIS — I358 Other nonrheumatic aortic valve disorders: Secondary | ICD-10-CM | POA: Diagnosis not present

## 2015-01-08 DIAGNOSIS — Z452 Encounter for adjustment and management of vascular access device: Secondary | ICD-10-CM | POA: Diagnosis not present

## 2015-01-08 DIAGNOSIS — I120 Hypertensive chronic kidney disease with stage 5 chronic kidney disease or end stage renal disease: Secondary | ICD-10-CM | POA: Diagnosis not present

## 2015-01-08 DIAGNOSIS — M4627 Osteomyelitis of vertebra, lumbosacral region: Secondary | ICD-10-CM | POA: Diagnosis not present

## 2015-01-08 DIAGNOSIS — M4640 Discitis, unspecified, site unspecified: Secondary | ICD-10-CM | POA: Diagnosis not present

## 2015-01-08 DIAGNOSIS — E1142 Type 2 diabetes mellitus with diabetic polyneuropathy: Secondary | ICD-10-CM | POA: Diagnosis not present

## 2015-01-10 DIAGNOSIS — Z452 Encounter for adjustment and management of vascular access device: Secondary | ICD-10-CM | POA: Diagnosis not present

## 2015-01-10 DIAGNOSIS — I358 Other nonrheumatic aortic valve disorders: Secondary | ICD-10-CM | POA: Diagnosis not present

## 2015-01-10 DIAGNOSIS — I330 Acute and subacute infective endocarditis: Secondary | ICD-10-CM | POA: Diagnosis not present

## 2015-01-10 DIAGNOSIS — M4627 Osteomyelitis of vertebra, lumbosacral region: Secondary | ICD-10-CM | POA: Diagnosis not present

## 2015-01-10 DIAGNOSIS — M4640 Discitis, unspecified, site unspecified: Secondary | ICD-10-CM | POA: Diagnosis not present

## 2015-01-10 DIAGNOSIS — E1142 Type 2 diabetes mellitus with diabetic polyneuropathy: Secondary | ICD-10-CM | POA: Diagnosis not present

## 2015-01-10 DIAGNOSIS — I120 Hypertensive chronic kidney disease with stage 5 chronic kidney disease or end stage renal disease: Secondary | ICD-10-CM | POA: Diagnosis not present

## 2015-01-14 DIAGNOSIS — Z452 Encounter for adjustment and management of vascular access device: Secondary | ICD-10-CM | POA: Diagnosis not present

## 2015-01-14 DIAGNOSIS — M4627 Osteomyelitis of vertebra, lumbosacral region: Secondary | ICD-10-CM | POA: Diagnosis not present

## 2015-01-14 DIAGNOSIS — E1142 Type 2 diabetes mellitus with diabetic polyneuropathy: Secondary | ICD-10-CM | POA: Diagnosis not present

## 2015-01-14 DIAGNOSIS — I358 Other nonrheumatic aortic valve disorders: Secondary | ICD-10-CM | POA: Diagnosis not present

## 2015-01-14 DIAGNOSIS — M4640 Discitis, unspecified, site unspecified: Secondary | ICD-10-CM | POA: Diagnosis not present

## 2015-01-14 DIAGNOSIS — I120 Hypertensive chronic kidney disease with stage 5 chronic kidney disease or end stage renal disease: Secondary | ICD-10-CM | POA: Diagnosis not present

## 2015-01-17 DIAGNOSIS — I38 Endocarditis, valve unspecified: Secondary | ICD-10-CM | POA: Diagnosis not present

## 2015-01-18 DIAGNOSIS — I330 Acute and subacute infective endocarditis: Secondary | ICD-10-CM | POA: Diagnosis not present

## 2015-01-18 DIAGNOSIS — I120 Hypertensive chronic kidney disease with stage 5 chronic kidney disease or end stage renal disease: Secondary | ICD-10-CM | POA: Diagnosis not present

## 2015-01-18 DIAGNOSIS — Z452 Encounter for adjustment and management of vascular access device: Secondary | ICD-10-CM | POA: Diagnosis not present

## 2015-01-18 DIAGNOSIS — M4640 Discitis, unspecified, site unspecified: Secondary | ICD-10-CM | POA: Diagnosis not present

## 2015-01-18 DIAGNOSIS — E1142 Type 2 diabetes mellitus with diabetic polyneuropathy: Secondary | ICD-10-CM | POA: Diagnosis not present

## 2015-01-18 DIAGNOSIS — I358 Other nonrheumatic aortic valve disorders: Secondary | ICD-10-CM | POA: Diagnosis not present

## 2015-01-18 DIAGNOSIS — M4627 Osteomyelitis of vertebra, lumbosacral region: Secondary | ICD-10-CM | POA: Diagnosis not present

## 2015-01-24 DIAGNOSIS — E1142 Type 2 diabetes mellitus with diabetic polyneuropathy: Secondary | ICD-10-CM | POA: Diagnosis not present

## 2015-01-24 DIAGNOSIS — I120 Hypertensive chronic kidney disease with stage 5 chronic kidney disease or end stage renal disease: Secondary | ICD-10-CM | POA: Diagnosis not present

## 2015-01-24 DIAGNOSIS — M4627 Osteomyelitis of vertebra, lumbosacral region: Secondary | ICD-10-CM | POA: Diagnosis not present

## 2015-01-24 DIAGNOSIS — Z452 Encounter for adjustment and management of vascular access device: Secondary | ICD-10-CM | POA: Diagnosis not present

## 2015-01-24 DIAGNOSIS — I358 Other nonrheumatic aortic valve disorders: Secondary | ICD-10-CM | POA: Diagnosis not present

## 2015-01-24 DIAGNOSIS — M4640 Discitis, unspecified, site unspecified: Secondary | ICD-10-CM | POA: Diagnosis not present

## 2015-01-28 DIAGNOSIS — M4640 Discitis, unspecified, site unspecified: Secondary | ICD-10-CM | POA: Diagnosis not present

## 2015-01-28 DIAGNOSIS — M4627 Osteomyelitis of vertebra, lumbosacral region: Secondary | ICD-10-CM | POA: Diagnosis not present

## 2015-01-28 DIAGNOSIS — I358 Other nonrheumatic aortic valve disorders: Secondary | ICD-10-CM | POA: Diagnosis not present

## 2015-01-28 DIAGNOSIS — Z452 Encounter for adjustment and management of vascular access device: Secondary | ICD-10-CM | POA: Diagnosis not present

## 2015-01-29 DIAGNOSIS — I358 Other nonrheumatic aortic valve disorders: Secondary | ICD-10-CM | POA: Diagnosis not present

## 2015-01-29 DIAGNOSIS — I120 Hypertensive chronic kidney disease with stage 5 chronic kidney disease or end stage renal disease: Secondary | ICD-10-CM | POA: Diagnosis not present

## 2015-01-29 DIAGNOSIS — E1142 Type 2 diabetes mellitus with diabetic polyneuropathy: Secondary | ICD-10-CM | POA: Diagnosis not present

## 2015-01-29 DIAGNOSIS — M4627 Osteomyelitis of vertebra, lumbosacral region: Secondary | ICD-10-CM | POA: Diagnosis not present

## 2015-01-29 DIAGNOSIS — Z452 Encounter for adjustment and management of vascular access device: Secondary | ICD-10-CM | POA: Diagnosis not present

## 2015-01-29 DIAGNOSIS — M4640 Discitis, unspecified, site unspecified: Secondary | ICD-10-CM | POA: Diagnosis not present

## 2015-01-31 DIAGNOSIS — I120 Hypertensive chronic kidney disease with stage 5 chronic kidney disease or end stage renal disease: Secondary | ICD-10-CM | POA: Diagnosis not present

## 2015-01-31 DIAGNOSIS — Z452 Encounter for adjustment and management of vascular access device: Secondary | ICD-10-CM | POA: Diagnosis not present

## 2015-01-31 DIAGNOSIS — M4627 Osteomyelitis of vertebra, lumbosacral region: Secondary | ICD-10-CM | POA: Diagnosis not present

## 2015-01-31 DIAGNOSIS — E1142 Type 2 diabetes mellitus with diabetic polyneuropathy: Secondary | ICD-10-CM | POA: Diagnosis not present

## 2015-01-31 DIAGNOSIS — M4640 Discitis, unspecified, site unspecified: Secondary | ICD-10-CM | POA: Diagnosis not present

## 2015-01-31 DIAGNOSIS — I358 Other nonrheumatic aortic valve disorders: Secondary | ICD-10-CM | POA: Diagnosis not present

## 2015-02-07 DIAGNOSIS — E785 Hyperlipidemia, unspecified: Secondary | ICD-10-CM | POA: Diagnosis not present

## 2015-02-07 DIAGNOSIS — N2581 Secondary hyperparathyroidism of renal origin: Secondary | ICD-10-CM | POA: Diagnosis not present

## 2015-02-07 DIAGNOSIS — Z940 Kidney transplant status: Secondary | ICD-10-CM | POA: Diagnosis not present

## 2015-02-07 DIAGNOSIS — I10 Essential (primary) hypertension: Secondary | ICD-10-CM | POA: Diagnosis not present

## 2015-02-07 DIAGNOSIS — D631 Anemia in chronic kidney disease: Secondary | ICD-10-CM | POA: Diagnosis not present

## 2015-02-07 DIAGNOSIS — E119 Type 2 diabetes mellitus without complications: Secondary | ICD-10-CM | POA: Diagnosis not present

## 2015-02-07 DIAGNOSIS — M109 Gout, unspecified: Secondary | ICD-10-CM | POA: Diagnosis not present

## 2015-02-11 ENCOUNTER — Telehealth: Payer: Self-pay | Admitting: Neurology

## 2015-02-11 DIAGNOSIS — R269 Unspecified abnormalities of gait and mobility: Secondary | ICD-10-CM

## 2015-02-11 NOTE — Telephone Encounter (Signed)
I called the patient. The patient has recently had an injection in the knee that became infected, he has required home health physical therapy. He is now transitioning to outpatient therapy, he wishes to get physical therapy at [place]. I will try to get this set up.

## 2015-02-11 NOTE — Telephone Encounter (Signed)
I called the patient on the home phone, and on the cell phone, messages. I will call back later.

## 2015-02-11 NOTE — Telephone Encounter (Signed)
Patient called inquiring about having a referral for PT. There is not one in the system. Please advise

## 2015-02-25 ENCOUNTER — Ambulatory Visit: Payer: Medicare Other | Attending: Neurology | Admitting: Physical Therapy

## 2015-02-25 DIAGNOSIS — R262 Difficulty in walking, not elsewhere classified: Secondary | ICD-10-CM | POA: Insufficient documentation

## 2015-02-25 DIAGNOSIS — M545 Low back pain: Secondary | ICD-10-CM | POA: Insufficient documentation

## 2015-02-25 DIAGNOSIS — R5381 Other malaise: Secondary | ICD-10-CM | POA: Insufficient documentation

## 2015-02-25 DIAGNOSIS — R269 Unspecified abnormalities of gait and mobility: Secondary | ICD-10-CM | POA: Insufficient documentation

## 2015-02-27 DIAGNOSIS — Z940 Kidney transplant status: Secondary | ICD-10-CM | POA: Diagnosis not present

## 2015-02-27 DIAGNOSIS — I330 Acute and subacute infective endocarditis: Secondary | ICD-10-CM | POA: Diagnosis not present

## 2015-02-27 DIAGNOSIS — I38 Endocarditis, valve unspecified: Secondary | ICD-10-CM | POA: Diagnosis not present

## 2015-02-27 DIAGNOSIS — I10 Essential (primary) hypertension: Secondary | ICD-10-CM | POA: Diagnosis not present

## 2015-02-27 DIAGNOSIS — B952 Enterococcus as the cause of diseases classified elsewhere: Secondary | ICD-10-CM | POA: Diagnosis not present

## 2015-02-27 DIAGNOSIS — E119 Type 2 diabetes mellitus without complications: Secondary | ICD-10-CM | POA: Diagnosis not present

## 2015-02-28 ENCOUNTER — Other Ambulatory Visit: Payer: Self-pay | Admitting: Neurology

## 2015-03-06 ENCOUNTER — Ambulatory Visit: Payer: Medicare Other | Admitting: Physical Therapy

## 2015-03-06 ENCOUNTER — Encounter: Payer: Self-pay | Admitting: Physical Therapy

## 2015-03-06 DIAGNOSIS — R269 Unspecified abnormalities of gait and mobility: Secondary | ICD-10-CM

## 2015-03-06 DIAGNOSIS — M545 Low back pain, unspecified: Secondary | ICD-10-CM

## 2015-03-06 DIAGNOSIS — R5381 Other malaise: Secondary | ICD-10-CM

## 2015-03-06 DIAGNOSIS — R262 Difficulty in walking, not elsewhere classified: Secondary | ICD-10-CM

## 2015-03-06 NOTE — Therapy (Signed)
[place] [address] [place], [place], [address] Phone: [phone]   Fax:  [phone]  Physical Therapy Evaluation  Patient Details  Name: [name] [name] MRN: [id number] Date of Birth: Mar 17, 1966 Referring Provider:  Kathrynn Ducking, MD  Encounter Date: 03/06/2015      PT End of Session - 03/06/15 1347    Visit Number 1   Date for PT Re-Evaluation 05/06/15   PT Start Time 1300   PT Stop Time 1358   PT Time Calculation (min) 58 min   Equipment Utilized During Treatment Gait belt   Activity Tolerance Patient limited by fatigue;Patient limited by pain      Past Medical History  Diagnosis Date  . Diabetes mellitus   . Hypertension   . Leg pain 02/05/11    with walking  . Weight gain   . Shortness of breath     when lying flat  . Diminished eyesight   . Hearing difficulty   . Chronic kidney disease     Dia;ysis MWF  . Anemia   . Nervousness(799.21)   . COPD (chronic obstructive pulmonary disease)   . CHF (congestive heart failure)   . Posttraumatic stress disorder   . Allergic rhinitis   . Dyslipidemia   . Morbid obesity   . Diabetic peripheral neuropathy   . Gait disorder   . Obstructive sleep apnea   . Bilateral carpal tunnel syndrome   . Anxiety disorder   . Gastroparesis   . Gout   . Diabetic retinopathy   . Renal transplant, status post 04/05/2014  . Incomplete bladder emptying   . Urethral stricture     Past Surgical History  Procedure Laterality Date  . Kidney transplant  2009  . Cataract extraction w/ intraocular lens  implant, bilateral Bilateral   . Av fistula placement Bilateral   . Arteriovenous graft placement Bilateral "several"  . Cystoscopy w/ internal urethrotomy      There were no vitals filed for this visit.  Visit Diagnosis:  Debility - Plan: PT plan of care cert/re-cert  Difficulty walking - Plan: PT plan of care cert/re-cert  Abnormality of gait - Plan: PT  plan of care cert/re-cert  Bilateral low back pain without sciatica - Plan: PT plan of care cert/re-cert      Subjective Assessment - 03/06/15 1259    Subjective Patient reports he was in a nursing facility and hospital for 3-4 months due to shoulder and back pain.  He reports that he was very debilitated and could not walk or get out of his apartment, describes having an infection.     Pertinent History ESRD, kidney transplant 2009, CVA during the surgery ? according to him   How long can you stand comfortably? 2   How long can you walk comfortably? 1   Diagnostic tests MRI negative   Patient Stated Goals no falls and walk in the home   Currently in Pain? Yes   Pain Score 5    Pain Location Back   Pain Orientation Lower   Pain Descriptors / Indicators Aching   Pain Type Chronic pain   Pain Onset More than a month ago   Pain Frequency Constant   Aggravating Factors  sitting 1 minutes   Pain Relieving Factors change positions            [facility] PT Assessment - 03/06/15 0001    Assessment   Medical Diagnosis gait abnormality, back pain  Onset Date/Surgical Date 10/05/14   Prior Therapy some in rehab facility and in home   Precautions   Precautions Fall   Restrictions   Weight Bearing Restrictions No   Balance Screen   Has the patient fallen in the past 6 months Yes   How many times? 2   Has the patient had a decrease in activity level because of a fear of falling?  No   Is the patient reluctant to leave their home because of a fear of falling?  No   Home Environment   Living Environment Private residence   Additional Comments has stairs, he reports he only does one time a day due to how difficult it is, he has to go up and down on hands and knees or on his backside.  He reports that he uses an electric chair on the first floor and to leave his home, he reports that he crawls on hands and knees on the second floor.   Prior Function   Level of Independence Independent with  community mobility with device;Independent with household mobility with device   Vocation Student   Leisure none   AROM   Overall AROM Comments left ankle DF no active motions, left hip and knee AROM is WFL's but with ataxic type motions especially knee   Strength   Overall Strength Comments right LE 4/5, left hip and knee 3+/5, left ankle 0/5   Transfers   Transfers --   requires mod A for most sit to stand transfers.   Ambulation/Gait   Gait Comments with a FWW.  Slow, very poor left LE control, drop foot, c/o pain int he bilateral groin and back with steps, supervision required, 25 feet max before needing to stop   Standardized Balance Assessment   Standardized Balance Assessment Timed Up and Go Test   Timed Up and Go Test   Normal TUG (seconds) 63                   [facility] Adult PT Treatment/Exercise - 03/06/15 0001    Knee/Hip Exercises: Aerobic   Tread Mill UBE Level 3 x 4 minutes   Isokinetic NuStep L4 x 5 minutes   Knee/Hip Exercises: Machines for Strengthening   Cybex Knee Extension 5# 2x15   Cybex Knee Flexion 25# 2x15                  PT Short Term Goals - 03/06/15 1352    PT SHORT TERM GOAL #1   Title independent with initial HEP   Time 4   Period Weeks   Status New           PT Long Term Goals - 03/06/15 1352    PT LONG TERM GOAL #1   Title decrease TUG time to 26 seconds   Time 8   Period Weeks   Status New   PT LONG TERM GOAL #2   Title decrease pain 50% when walking   Time 8   Period Weeks   Status New   PT LONG TERM GOAL #3   Title sit to stand at all times independently   Time 8   Period Weeks   Status New   PT LONG TERM GOAL #4   Title walk with walker 300 feet   Time 8   Period Weeks   Status New               Plan - 03/06/15 1347    Clinical Impression  Statement Patient reports a ureter surgery in December.  He reports having an infection that was severe.  He was in the hospital and rehab facility for over  4 months.  He has residual left leg difficulties from a CVA in 2009 after kidney transplant.  He is very debilitated and has great difficulty being mobile.  Uses electric chair for most mobility.   Pt will benefit from skilled therapeutic intervention in order to improve on the following deficits Abnormal gait;Cardiopulmonary status limiting activity;Decreased activity tolerance;Decreased balance;Decreased mobility;Decreased endurance;Decreased range of motion;Decreased strength;Difficulty walking;Pain   Rehab Potential Good   PT Frequency 2x / week   PT Duration 8 weeks   PT Treatment/Interventions Electrical Stimulation;Cryotherapy;Moist Heat;Ultrasound;Gait training;Functional mobility training;Therapeutic activities;Therapeutic exercise;Balance training;Neuromuscular re-education;Manual techniques;Patient/family education   PT Next Visit Plan Slowly add exercises, could try modalities for pain   Consulted and Agree with Plan of Care Patient          G-Codes - 03-08-15 1354    Functional Assessment Tool Used FOTO   Functional Limitation Mobility: Walking and moving around   Mobility: Walking and Moving Around Current Status [phone]) At least 60 percent but less than 80 percent impaired, limited or restricted   Mobility: Walking and Moving Around Goal Status [phone]) At least 40 percent but less than 60 percent impaired, limited or restricted       Problem List Patient Active Problem List   Diagnosis Date Noted  . Back pain   . Urine retention   . Back pain at L4-L5 level   . Weakness of both legs   . AKI (acute kidney injury)   . Anemia due to chronic kidney disease   . Low back pain   . Cervical radiculopathy   . Acute kidney injury 10/24/2014  . Anemia 10/24/2014  . Diabetic peripheral neuropathy 10/16/2014  . Renal transplant, status post 04/05/2014  . OSA (obstructive sleep apnea) 04/05/2014  . Severe obesity (BMI >= 40) 04/05/2014  . Benign fibroma of prostate 02/05/2014   . Abdominal apron 04/08/2012  . Chronic kidney failure 03/28/2012  . Benign essential HTN 03/24/2012  . H/O kidney transplant 03/24/2012  . Diabetic macular edema 01/15/2012  . Diabetes mellitus, type 2 01/15/2012  . Diabetic proliferative retinopathy 01/15/2012  . HYPERTENSION, HEART CONTROLLED W/O ASSOC CHF 06/27/2010  . CHEST PAIN 06/03/2010  . IMMUNOCOMPROMISED 01/30/2010  . RECTAL PAIN 01/30/2010  . NAUSEA AND VOMITING 01/30/2010  . DIARRHEA 01/30/2010  . KIDNEY TRANSPLANTATION, HX OF 01/30/2010  . IDDM 01/27/2010  . HYPERPARATHYROIDISM, SECONDARY 01/27/2010  . GOUT 01/27/2010  . Anxiety state 01/27/2010  . GASTROPARESIS 01/27/2010  . END STAGE RENAL DISEASE 01/27/2010  . FROZEN LEFT SHOULDER 01/27/2010  . INSOMNIA 01/27/2010  . DIABETIC  RETINOPATHY 05/25/2008  . HYPERLIPIDEMIA 05/25/2008  . ERECTILE DYSFUNCTION 05/25/2008  . OBSTRUCTIVE SLEEP APNEA 05/25/2008  . Essential hypertension 05/25/2008    Sumner Boast., PT 2015/03/08, 1:57 PM  [address] [place], [place], [address] Phone: [phone]   Fax:  [phone]

## 2015-03-13 ENCOUNTER — Ambulatory Visit: Payer: Medicare Other | Attending: Neurology | Admitting: Physical Therapy

## 2015-03-13 DIAGNOSIS — R5381 Other malaise: Secondary | ICD-10-CM | POA: Insufficient documentation

## 2015-03-13 DIAGNOSIS — R269 Unspecified abnormalities of gait and mobility: Secondary | ICD-10-CM | POA: Insufficient documentation

## 2015-03-13 DIAGNOSIS — M545 Low back pain, unspecified: Secondary | ICD-10-CM

## 2015-03-13 DIAGNOSIS — R262 Difficulty in walking, not elsewhere classified: Secondary | ICD-10-CM

## 2015-03-13 NOTE — Therapy (Signed)
[place] [address] [place], [place], [address] Phone: [phone]   Fax:  [phone]  Physical Therapy Treatment  Patient Details  Name: [name] [name] MRN: [id number] Date of Birth: Nov 18, 1965 Referring Provider:  Kathrynn Ducking, MD  Encounter Date: 03/13/2015      PT End of Session - 03/13/15 1608    Visit Number 2   Date for PT Re-Evaluation 05/06/15   PT Start Time [id number]   PT Stop Time 1608   PT Time Calculation (min) 38 min   Activity Tolerance Patient limited by fatigue;Patient limited by pain   Behavior During Therapy [facility] for tasks assessed/performed      Past Medical History  Diagnosis Date  . Diabetes mellitus   . Hypertension   . Leg pain 02/05/11    with walking  . Weight gain   . Shortness of breath     when lying flat  . Diminished eyesight   . Hearing difficulty   . Chronic kidney disease     Dia;ysis MWF  . Anemia   . Nervousness(799.21)   . COPD (chronic obstructive pulmonary disease)   . CHF (congestive heart failure)   . Posttraumatic stress disorder   . Allergic rhinitis   . Dyslipidemia   . Morbid obesity   . Diabetic peripheral neuropathy   . Gait disorder   . Obstructive sleep apnea   . Bilateral carpal tunnel syndrome   . Anxiety disorder   . Gastroparesis   . Gout   . Diabetic retinopathy   . Renal transplant, status post 04/05/2014  . Incomplete bladder emptying   . Urethral stricture     Past Surgical History  Procedure Laterality Date  . Kidney transplant  2009  . Cataract extraction w/ intraocular lens  implant, bilateral Bilateral   . Av fistula placement Bilateral   . Arteriovenous graft placement Bilateral "several"  . Cystoscopy w/ internal urethrotomy      There were no vitals filed for this visit.  Visit Diagnosis:  Debility  Difficulty walking  Abnormality of gait  Bilateral low back pain without sciatica      Subjective Assessment -  03/13/15 1536    Subjective Having some back pain today; motivated to improve.   Pertinent History ESRD, kidney transplant 2009, CVA during the surgery ? according to him   Patient Stated Goals no falls and walk in the home   Currently in Pain? Yes   Pain Score 6    Pain Location Back   Pain Orientation Lower   Pain Descriptors / Indicators Aching   Pain Type Chronic pain   Pain Onset More than a month ago   Pain Frequency Constant   Aggravating Factors  sitting   Pain Relieving Factors change positions                         [facility] Adult PT Treatment/Exercise - 03/13/15 1537    Knee/Hip Exercises: Aerobic   Isokinetic NuStep L5 x 8 minutes   Knee/Hip Exercises: Seated   Long Arc Quad Both;2 sets;10 reps;Weights   Long Arc Quad Weight 2 lbs.   Other Seated Knee Exercises seated marching 2x10 with 2# bil; seated hip addct ball squeeze 2x10; hip abdct with green tband 2x10   Other Seated Knee Exercises hamstring curls with green theraband 2x10; eccentric control L ankle dorsiflexion x 10  PT Short Term Goals - 03/13/15 1610    PT SHORT TERM GOAL #1   Title independent with initial HEP   Status On-going           PT Long Term Goals - 03/13/15 1610    PT LONG TERM GOAL #1   Title decrease TUG time to 26 seconds   Status On-going   PT LONG TERM GOAL #2   Title decrease pain 50% when walking   Status On-going   PT LONG TERM GOAL #3   Title sit to stand at all times independently   Status On-going   PT LONG TERM GOAL #4   Title walk with walker 300 feet   Status On-going               Plan - 03/13/15 1609    Clinical Impression Statement Pt fatigues quickly with strengthening exercises.  Has AFO for LLE but does not wear.  Will cont to benefit from PT to maximize function.   PT Next Visit Plan Slowly add exercises, could try modalities for pain; (pt requesting to get on nustep or UBE if arrives early for tx session)    Consulted and Agree with Plan of Care Patient        Problem List Patient Active Problem List   Diagnosis Date Noted  . Back pain   . Urine retention   . Back pain at L4-L5 level   . Weakness of both legs   . AKI (acute kidney injury)   . Anemia due to chronic kidney disease   . Low back pain   . Cervical radiculopathy   . Acute kidney injury 10/24/2014  . Anemia 10/24/2014  . Diabetic peripheral neuropathy 10/16/2014  . Renal transplant, status post 04/05/2014  . OSA (obstructive sleep apnea) 04/05/2014  . Severe obesity (BMI >= 40) 04/05/2014  . Benign fibroma of prostate 02/05/2014  . Abdominal apron 04/08/2012  . Chronic kidney failure 03/28/2012  . Benign essential HTN 03/24/2012  . H/O kidney transplant 03/24/2012  . Diabetic macular edema 01/15/2012  . Diabetes mellitus, type 2 01/15/2012  . Diabetic proliferative retinopathy 01/15/2012  . HYPERTENSION, HEART CONTROLLED W/O ASSOC CHF 06/27/2010  . CHEST PAIN 06/03/2010  . IMMUNOCOMPROMISED 01/30/2010  . RECTAL PAIN 01/30/2010  . NAUSEA AND VOMITING 01/30/2010  . DIARRHEA 01/30/2010  . KIDNEY TRANSPLANTATION, HX OF 01/30/2010  . IDDM 01/27/2010  . HYPERPARATHYROIDISM, SECONDARY 01/27/2010  . GOUT 01/27/2010  . Anxiety state 01/27/2010  . GASTROPARESIS 01/27/2010  . END STAGE RENAL DISEASE 01/27/2010  . FROZEN LEFT SHOULDER 01/27/2010  . INSOMNIA 01/27/2010  . DIABETIC  RETINOPATHY 05/25/2008  . HYPERLIPIDEMIA 05/25/2008  . ERECTILE DYSFUNCTION 05/25/2008  . OBSTRUCTIVE SLEEP APNEA 05/25/2008  . Essential hypertension 05/25/2008   Laureen Abrahams, PT, DPT 03/13/2015 4:11 PM  [place] [address] [place], [place], [address] Phone: [phone]   Fax:  [phone]

## 2015-03-18 ENCOUNTER — Ambulatory Visit: Payer: Medicare Other | Admitting: Physical Therapy

## 2015-03-19 ENCOUNTER — Ambulatory Visit: Payer: Medicare Other | Admitting: Rehabilitation

## 2015-03-19 DIAGNOSIS — R5381 Other malaise: Secondary | ICD-10-CM | POA: Diagnosis not present

## 2015-03-19 DIAGNOSIS — R269 Unspecified abnormalities of gait and mobility: Secondary | ICD-10-CM | POA: Diagnosis not present

## 2015-03-19 DIAGNOSIS — M545 Low back pain, unspecified: Secondary | ICD-10-CM

## 2015-03-19 DIAGNOSIS — R262 Difficulty in walking, not elsewhere classified: Secondary | ICD-10-CM | POA: Diagnosis not present

## 2015-03-19 NOTE — Therapy (Signed)
[place], [place], [address] Phone: [phone]   Fax:  [phone]  Physical Therapy Treatment  Patient Details  Name: [name] [name] MRN: [id number] Date of Birth: 1965-12-18 Referring Provider:  Kathrynn Ducking, MD  Encounter Date: 03/19/2015      PT End of Session - 03/19/15 0938    Visit Number 3   PT Start Time [id number]   PT Stop Time [id number]   PT Time Calculation (min) 55 min      Past Medical History  Diagnosis Date  . Diabetes mellitus   . Hypertension   . Leg pain 02/05/11    with walking  . Weight gain   . Shortness of breath     when lying flat  . Diminished eyesight   . Hearing difficulty   . Chronic kidney disease     Dia;ysis MWF  . Anemia   . Nervousness(799.21)   . COPD (chronic obstructive pulmonary disease)   . CHF (congestive heart failure)   . Posttraumatic stress disorder   . Allergic rhinitis   . Dyslipidemia   . Morbid obesity   . Diabetic peripheral neuropathy   . Gait disorder   . Obstructive sleep apnea   . Bilateral carpal tunnel syndrome   . Anxiety disorder   . Gastroparesis   . Gout   . Diabetic retinopathy   . Renal transplant, status post 04/05/2014  . Incomplete bladder emptying   . Urethral stricture     Past Surgical History  Procedure Laterality Date  . Kidney transplant  2009  . Cataract extraction w/ intraocular lens  implant, bilateral Bilateral   . Av fistula placement Bilateral   . Arteriovenous graft placement Bilateral "several"  . Cystoscopy w/ internal urethrotomy      There were no vitals filed for this visit.  Visit Diagnosis:  Difficulty walking  Bilateral low back pain without sciatica  Abnormality of gait      Subjective Assessment - 03/19/15 0938    Subjective States feels ok today.   Late bc transporter took him to [employer] clinic accidentally   Currently in Pain? Yes   Pain Score 6    Pain Location Back   Pain Orientation Lower   Pain Descriptors / Indicators Aching   Pain Type Chronic pain                         [facility] Adult PT Treatment/Exercise - 03/19/15 0001    Ambulation/Gait   Ambulation/Gait Yes   Ambulation/Gait Assistance 4: Min guard   Ambulation Distance (Feet) 60 Feet   Assistive device Rolling walker   Gait Pattern Left steppage;Wide base of support;Poor foot clearance - left   Ambulation Surface Level   Exercises   Exercises Knee/Hip   Knee/Hip Exercises: Aerobic   Tread Mill UBE x 13' L2   Isokinetic NuStep 5' L5    Knee/Hip Exercises: Machines for Strengthening   Cybex Knee Extension 10# 2/10   Cybex Knee Flexion 25# 2/10   Knee/Hip Exercises: Standing   Other Standing Knee Exercises RW, 3#;  toe raise, heel raise, march   Knee/Hip Exercises: Seated   Long Arc Quad Both;2 sets;10 reps   Long Arc Quad Weight 3 lbs.   Other Seated Knee Exercises 3# march, ball squeeze, green TB ER x 2/10 BLE   Other Seated Knee Exercises eccentric ankle DF with manual A  PT Short Term Goals - 03/13/15 1610    PT SHORT TERM GOAL #1   Title independent with initial HEP   Status On-going           PT Long Term Goals - 03/13/15 1610    PT LONG TERM GOAL #1   Title decrease TUG time to 26 seconds   Status On-going   PT LONG TERM GOAL #2   Title decrease pain 50% when walking   Status On-going   PT LONG TERM GOAL #3   Title sit to stand at all times independently   Status On-going   PT LONG TERM GOAL #4   Title walk with walker 300 feet   Status On-going               Plan - 03/19/15 1053    Clinical Impression Statement Pt. does very well today.   Highly motivated and works hard.    Pain and exertional intolerance are his limitations.    Continue to advance toward WB activity/endurance in standing.   PT Next Visit Plan Add/advance sink exercises as able.    increase gait distance        Problem List Patient  Active Problem List   Diagnosis Date Noted  . Back pain   . Urine retention   . Back pain at L4-L5 level   . Weakness of both legs   . AKI (acute kidney injury)   . Anemia due to chronic kidney disease   . Low back pain   . Cervical radiculopathy   . Acute kidney injury 10/24/2014  . Anemia 10/24/2014  . Diabetic peripheral neuropathy 10/16/2014  . Renal transplant, status post 04/05/2014  . OSA (obstructive sleep apnea) 04/05/2014  . Severe obesity (BMI >= 40) 04/05/2014  . Benign fibroma of prostate 02/05/2014  . Abdominal apron 04/08/2012  . Chronic kidney failure 03/28/2012  . Benign essential HTN 03/24/2012  . H/O kidney transplant 03/24/2012  . Diabetic macular edema 01/15/2012  . Diabetes mellitus, type 2 01/15/2012  . Diabetic proliferative retinopathy 01/15/2012  . HYPERTENSION, HEART CONTROLLED W/O ASSOC CHF 06/27/2010  . CHEST PAIN 06/03/2010  . IMMUNOCOMPROMISED 01/30/2010  . RECTAL PAIN 01/30/2010  . NAUSEA AND VOMITING 01/30/2010  . DIARRHEA 01/30/2010  . KIDNEY TRANSPLANTATION, HX OF 01/30/2010  . IDDM 01/27/2010  . HYPERPARATHYROIDISM, SECONDARY 01/27/2010  . GOUT 01/27/2010  . Anxiety state 01/27/2010  . GASTROPARESIS 01/27/2010  . END STAGE RENAL DISEASE 01/27/2010  . FROZEN LEFT SHOULDER 01/27/2010  . INSOMNIA 01/27/2010  . DIABETIC  RETINOPATHY 05/25/2008  . HYPERLIPIDEMIA 05/25/2008  . ERECTILE DYSFUNCTION 05/25/2008  . OBSTRUCTIVE SLEEP APNEA 05/25/2008  . Essential hypertension 05/25/2008    Volney American, PT 03/19/2015, 10:56 AM  [address] [place], [place], [address] Phone: [phone]   Fax:  [phone]

## 2015-03-20 DIAGNOSIS — N2581 Secondary hyperparathyroidism of renal origin: Secondary | ICD-10-CM | POA: Diagnosis not present

## 2015-03-20 DIAGNOSIS — Z940 Kidney transplant status: Secondary | ICD-10-CM | POA: Diagnosis not present

## 2015-03-20 DIAGNOSIS — M109 Gout, unspecified: Secondary | ICD-10-CM | POA: Diagnosis not present

## 2015-03-21 ENCOUNTER — Encounter: Payer: Self-pay | Admitting: Physical Therapy

## 2015-03-21 ENCOUNTER — Ambulatory Visit: Payer: Medicare Other | Admitting: Physical Therapy

## 2015-03-21 DIAGNOSIS — R262 Difficulty in walking, not elsewhere classified: Secondary | ICD-10-CM

## 2015-03-21 DIAGNOSIS — R269 Unspecified abnormalities of gait and mobility: Secondary | ICD-10-CM

## 2015-03-21 DIAGNOSIS — M545 Low back pain, unspecified: Secondary | ICD-10-CM

## 2015-03-21 DIAGNOSIS — R5381 Other malaise: Secondary | ICD-10-CM

## 2015-03-21 NOTE — Therapy (Signed)
[place], [place], [address] Phone: [phone]   Fax:  [phone]  Physical Therapy Treatment  Patient Details  Name: [name] [name] MRN: [id number] Date of Birth: Jan 20, 1966 Referring Provider:  Kathrynn Ducking, MD  Encounter Date: 03/21/2015      PT End of Session - 03/21/15 1050    Visit Number 4   PT Start Time [id number]   PT Stop Time [id number]   PT Time Calculation (min) 58 min   Activity Tolerance Patient limited by fatigue;Patient limited by pain   Behavior During Therapy [facility] for tasks assessed/performed      Past Medical History  Diagnosis Date  . Diabetes mellitus   . Hypertension   . Leg pain 02/05/11    with walking  . Weight gain   . Shortness of breath     when lying flat  . Diminished eyesight   . Hearing difficulty   . Chronic kidney disease     Dia;ysis MWF  . Anemia   . Nervousness(799.21)   . COPD (chronic obstructive pulmonary disease)   . CHF (congestive heart failure)   . Posttraumatic stress disorder   . Allergic rhinitis   . Dyslipidemia   . Morbid obesity   . Diabetic peripheral neuropathy   . Gait disorder   . Obstructive sleep apnea   . Bilateral carpal tunnel syndrome   . Anxiety disorder   . Gastroparesis   . Gout   . Diabetic retinopathy   . Renal transplant, status post 04/05/2014  . Incomplete bladder emptying   . Urethral stricture     Past Surgical History  Procedure Laterality Date  . Kidney transplant  2009  . Cataract extraction w/ intraocular lens  implant, bilateral Bilateral   . Av fistula placement Bilateral   . Arteriovenous graft placement Bilateral "several"  . Cystoscopy w/ internal urethrotomy      There were no vitals filed for this visit.  Visit Diagnosis:  Difficulty walking  Bilateral low back pain without sciatica  Abnormality of gait  Debility      Subjective Assessment - 03/21/15 0940    Subjective Fatigued  and a little sore   Currently in Pain? Yes   Pain Score 5    Pain Location Back   Aggravating Factors  sitting long periods   Pain Relieving Factors shifting around                         [facility] Adult PT Treatment/Exercise - 03/21/15 0001    Ambulation/Gait   Ambulation Distance (Feet) 60 Feet   Assistive device Rolling walker   Gait Pattern Left steppage;Wide base of support;Poor foot clearance - left   Gait Comments performed the above 3 x   Knee/Hip Exercises: Aerobic   Tread Mill Level 4 x 6 minutes   Isokinetic NuStep 5' L5    Knee/Hip Exercises: Machines for Strengthening   Cybex Knee Extension 10# 2/15   Cybex Knee Flexion 25# 3/10   Knee/Hip Exercises: Standing   Other Standing Knee Exercises RW, 3#;  toe raise, heel raise, march   Modalities   Modalities Electrical Stimulation   Electrical Stimulation   Electrical Stimulation Location L/S area   Electrical Stimulation Parameters IFC   Electrical Stimulation Goals Pain                  PT Short Term Goals - 03/13/15 1610  PT SHORT TERM GOAL #1   Title independent with initial HEP   Status On-going           PT Long Term Goals - 03/21/15 1051    PT LONG TERM GOAL #1   Title decrease TUG time to 26 seconds   Status On-going               Plan - 03/21/15 1050    Clinical Impression Statement Pain at times is limiting factor, pain is in the low back.  He does well and gives great effort.   PT Next Visit Plan Add/advance sink exercises as able.    increase gait distance   Consulted and Agree with Plan of Care Patient        Problem List Patient Active Problem List   Diagnosis Date Noted  . Back pain   . Urine retention   . Back pain at L4-L5 level   . Weakness of both legs   . AKI (acute kidney injury)   . Anemia due to chronic kidney disease   . Low back pain   . Cervical radiculopathy   . Acute kidney injury 10/24/2014  . Anemia 10/24/2014  . Diabetic  peripheral neuropathy 10/16/2014  . Renal transplant, status post 04/05/2014  . OSA (obstructive sleep apnea) 04/05/2014  . Severe obesity (BMI >= 40) 04/05/2014  . Benign fibroma of prostate 02/05/2014  . Abdominal apron 04/08/2012  . Chronic kidney failure 03/28/2012  . Benign essential HTN 03/24/2012  . H/O kidney transplant 03/24/2012  . Diabetic macular edema 01/15/2012  . Diabetes mellitus, type 2 01/15/2012  . Diabetic proliferative retinopathy 01/15/2012  . HYPERTENSION, HEART CONTROLLED W/O ASSOC CHF 06/27/2010  . CHEST PAIN 06/03/2010  . IMMUNOCOMPROMISED 01/30/2010  . RECTAL PAIN 01/30/2010  . NAUSEA AND VOMITING 01/30/2010  . DIARRHEA 01/30/2010  . KIDNEY TRANSPLANTATION, HX OF 01/30/2010  . IDDM 01/27/2010  . HYPERPARATHYROIDISM, SECONDARY 01/27/2010  . GOUT 01/27/2010  . Anxiety state 01/27/2010  . GASTROPARESIS 01/27/2010  . END STAGE RENAL DISEASE 01/27/2010  . FROZEN LEFT SHOULDER 01/27/2010  . INSOMNIA 01/27/2010  . DIABETIC  RETINOPATHY 05/25/2008  . HYPERLIPIDEMIA 05/25/2008  . ERECTILE DYSFUNCTION 05/25/2008  . OBSTRUCTIVE SLEEP APNEA 05/25/2008  . Essential hypertension 05/25/2008    Sumner Boast., PT 03/21/2015, 10:52 AM  [place] [address] [place], [place], [address] Phone: [phone]   Fax:  [phone]

## 2015-03-22 DIAGNOSIS — D649 Anemia, unspecified: Secondary | ICD-10-CM | POA: Diagnosis not present

## 2015-03-22 DIAGNOSIS — E119 Type 2 diabetes mellitus without complications: Secondary | ICD-10-CM | POA: Diagnosis not present

## 2015-03-22 DIAGNOSIS — I10 Essential (primary) hypertension: Secondary | ICD-10-CM | POA: Diagnosis not present

## 2015-03-22 DIAGNOSIS — Z940 Kidney transplant status: Secondary | ICD-10-CM | POA: Diagnosis not present

## 2015-03-26 ENCOUNTER — Encounter: Payer: Self-pay | Admitting: Physical Therapy

## 2015-03-26 ENCOUNTER — Ambulatory Visit: Payer: Medicare Other | Admitting: Physical Therapy

## 2015-03-26 DIAGNOSIS — M545 Low back pain, unspecified: Secondary | ICD-10-CM

## 2015-03-26 DIAGNOSIS — R269 Unspecified abnormalities of gait and mobility: Secondary | ICD-10-CM | POA: Diagnosis not present

## 2015-03-26 DIAGNOSIS — R262 Difficulty in walking, not elsewhere classified: Secondary | ICD-10-CM | POA: Diagnosis not present

## 2015-03-26 DIAGNOSIS — R5381 Other malaise: Secondary | ICD-10-CM | POA: Diagnosis not present

## 2015-03-26 NOTE — Therapy (Signed)
[place], [place], [address] Phone: [phone]   Fax:  [phone]  Physical Therapy Treatment  Patient Details  Name: [name] [name] MRN: [id number] Date of Birth: 07-20-1966 Referring Provider:  Kathrynn Ducking, MD  Encounter Date: 03/26/2015      PT End of Session - 03/26/15 1022    Visit Number 5   Date for PT Re-Evaluation 05/06/15   PT Start Time 0930   PT Stop Time 1025   PT Time Calculation (min) 55 min      Past Medical History  Diagnosis Date  . Diabetes mellitus   . Hypertension   . Leg pain 02/05/11    with walking  . Weight gain   . Shortness of breath     when lying flat  . Diminished eyesight   . Hearing difficulty   . Chronic kidney disease     Dia;ysis MWF  . Anemia   . Nervousness(799.21)   . COPD (chronic obstructive pulmonary disease)   . CHF (congestive heart failure)   . Posttraumatic stress disorder   . Allergic rhinitis   . Dyslipidemia   . Morbid obesity   . Diabetic peripheral neuropathy   . Gait disorder   . Obstructive sleep apnea   . Bilateral carpal tunnel syndrome   . Anxiety disorder   . Gastroparesis   . Gout   . Diabetic retinopathy   . Renal transplant, status post 04/05/2014  . Incomplete bladder emptying   . Urethral stricture     Past Surgical History  Procedure Laterality Date  . Kidney transplant  2009  . Cataract extraction w/ intraocular lens  implant, bilateral Bilateral   . Av fistula placement Bilateral   . Arteriovenous graft placement Bilateral "several"  . Cystoscopy w/ internal urethrotomy      There were no vitals filed for this visit.  Visit Diagnosis:  Bilateral low back pain without sciatica  Difficulty walking      Subjective Assessment - 03/26/15 0931    Subjective doing more at home   Currently in Pain? Yes   Pain Score 4    Pain Location Groin   Aggravating Factors  standing/walking                          [facility] Adult PT Treatment/Exercise - 03/26/15 0001    Ambulation/Gait   Ambulation/Gait Yes   Ambulation/Gait Assistance 4: Min guard   Ambulation Distance (Feet) 75 Feet   Assistive device Rolling walker   Gait Pattern Wide base of support;Poor foot clearance - left   Ambulation Surface Level;Indoor   Gait Comments 2 times to increase stamina   Knee/Hip Exercises: Aerobic   Tread Mill Level 5 x 6 minutes   Isokinetic NuStep 58min L5    Knee/Hip Exercises: Machines for Strengthening   Cybex Knee Extension 10# 2 sets 15  needed extra pillows to get head up d/t difficulty breathing   Cybex Knee Flexion 25# 3 sets 10   Knee/Hip Exercises: Plyometrics   Other Plyometric Exercises leg press 30# 3 sets 10   Knee/Hip Exercises: Standing   Forward Step Up Both;2 sets;5 sets  4 inch   Functional Squat 2 sets;5 sets  with RW                  PT Short Term Goals - 03/13/15 1610    PT SHORT TERM GOAL #1  Title independent with initial HEP   Status On-going           PT Long Term Goals - 03/21/15 1051    PT LONG TERM GOAL #1   Title decrease TUG time to 26 seconds   Status On-going               Plan - 03/26/15 1022    Clinical Impression Statement pt had some increased pain with standing and gait but able ot tolerate, difficulty breathing with supine. Pt tolerated new ther ex well, some VCIng to for correct tech. Added in step ups to work on strength so pt can walk up steps at home vs. crawl, heavy use of UE    PT Next Visit Plan progress gait/strength and balance        Problem List Patient Active Problem List   Diagnosis Date Noted  . Back pain   . Urine retention   . Back pain at L4-L5 level   . Weakness of both legs   . AKI (acute kidney injury)   . Anemia due to chronic kidney disease   . Low back pain   . Cervical radiculopathy   . Acute kidney injury 10/24/2014  . Anemia 10/24/2014  . Diabetic  peripheral neuropathy 10/16/2014  . Renal transplant, status post 04/05/2014  . OSA (obstructive sleep apnea) 04/05/2014  . Severe obesity (BMI >= 40) 04/05/2014  . Benign fibroma of prostate 02/05/2014  . Abdominal apron 04/08/2012  . Chronic kidney failure 03/28/2012  . Benign essential HTN 03/24/2012  . H/O kidney transplant 03/24/2012  . Diabetic macular edema 01/15/2012  . Diabetes mellitus, type 2 01/15/2012  . Diabetic proliferative retinopathy 01/15/2012  . HYPERTENSION, HEART CONTROLLED W/O ASSOC CHF 06/27/2010  . CHEST PAIN 06/03/2010  . IMMUNOCOMPROMISED 01/30/2010  . RECTAL PAIN 01/30/2010  . NAUSEA AND VOMITING 01/30/2010  . DIARRHEA 01/30/2010  . KIDNEY TRANSPLANTATION, HX OF 01/30/2010  . IDDM 01/27/2010  . HYPERPARATHYROIDISM, SECONDARY 01/27/2010  . GOUT 01/27/2010  . Anxiety state 01/27/2010  . GASTROPARESIS 01/27/2010  . END STAGE RENAL DISEASE 01/27/2010  . FROZEN LEFT SHOULDER 01/27/2010  . INSOMNIA 01/27/2010  . DIABETIC  RETINOPATHY 05/25/2008  . HYPERLIPIDEMIA 05/25/2008  . ERECTILE DYSFUNCTION 05/25/2008  . OBSTRUCTIVE SLEEP APNEA 05/25/2008  . Essential hypertension 05/25/2008    PAYSEUR,ANGIE PTA 03/26/2015, 10:25 AM  [address] [place], [place], [address] Phone: [phone]   Fax:  [phone]

## 2015-03-27 DIAGNOSIS — Z940 Kidney transplant status: Secondary | ICD-10-CM | POA: Diagnosis not present

## 2015-03-28 ENCOUNTER — Encounter: Payer: Self-pay | Admitting: Gastroenterology

## 2015-03-29 ENCOUNTER — Ambulatory Visit: Payer: Medicare Other | Admitting: Physical Therapy

## 2015-03-29 DIAGNOSIS — R5381 Other malaise: Secondary | ICD-10-CM

## 2015-03-29 DIAGNOSIS — M545 Low back pain, unspecified: Secondary | ICD-10-CM

## 2015-03-29 DIAGNOSIS — R269 Unspecified abnormalities of gait and mobility: Secondary | ICD-10-CM | POA: Diagnosis not present

## 2015-03-29 DIAGNOSIS — R262 Difficulty in walking, not elsewhere classified: Secondary | ICD-10-CM

## 2015-03-29 NOTE — Therapy (Signed)
[place], [place], [address] Phone: [phone]   Fax:  [phone]  Physical Therapy Treatment  Patient Details  Name: [name] [name] MRN: [id number] Date of Birth: [birth year] Referring Provider:  Kathrynn Ducking, MD  Encounter Date: 03/29/2015      PT End of Session - 03/29/15 0938    Visit Number 6   Date for PT Re-Evaluation 05/06/15   PT Start Time 0845   PT Stop Time 0930   PT Time Calculation (min) 45 min   Equipment Utilized During Treatment Gait belt   Activity Tolerance Patient limited by fatigue;Patient tolerated treatment well   Behavior During Therapy [facility] for tasks assessed/performed      Past Medical History  Diagnosis Date   Diabetes mellitus    Hypertension    Leg pain 02/05/11    with walking   Weight gain    Shortness of breath     when lying flat   Diminished eyesight    Hearing difficulty    Chronic kidney disease     Dia;ysis MWF   Anemia    Nervousness(799.21)    COPD (chronic obstructive pulmonary disease)    CHF (congestive heart failure)    Posttraumatic stress disorder    Allergic rhinitis    Dyslipidemia    Morbid obesity    Diabetic peripheral neuropathy    Gait disorder    Obstructive sleep apnea    Bilateral carpal tunnel syndrome    Anxiety disorder    Gastroparesis    Gout    Diabetic retinopathy    Renal transplant, status post 04/05/2014   Incomplete bladder emptying    Urethral stricture     Past Surgical History  Procedure Laterality Date   Kidney transplant  2009   Cataract extraction w/ intraocular lens  implant, bilateral Bilateral    Av fistula placement Bilateral    Arteriovenous graft placement Bilateral "several"   Cystoscopy w/ internal urethrotomy      There were no vitals filed for this visit.  Visit Diagnosis:  Bilateral low back pain without sciatica  Difficulty  walking  Abnormality of gait  Debility      Subjective Assessment - 03/29/15 0915    Subjective Doing well.  Trying to walk more freqently at home.                         [facility] Adult PT Treatment/Exercise - 03/29/15 0001    Ambulation/Gait   Ambulation/Gait Yes   Ambulation/Gait Assistance 4: Min guard   Ambulation Distance (Feet) 75 Feet   Assistive device Rolling walker   Gait Pattern Wide base of support;Poor foot clearance - left   Ambulation Surface Level;Indoor   Gait Comments 2 times to increase stamina   Knee/Hip Exercises: Aerobic   Isokinetic NuStep 72min L5    Knee/Hip Exercises: Machines for Strengthening   Cybex Knee Extension 15# 2 sets 15   Cybex Knee Flexion 25# 3 sets 10   Knee/Hip Exercises: Plyometrics   Other Plyometric Exercises leg press 30# 3 sets 10   Knee/Hip Exercises: Standing   Other Standing Knee Exercises sit<->stand. 2x5                  PT Short Term Goals - 03/13/15 1610    PT SHORT TERM GOAL #1   Title independent with initial HEP   Status On-going  PT Long Term Goals - 03/21/15 1051    PT LONG TERM GOAL #1   Title decrease TUG time to 26 seconds   Status On-going               Plan - 03/29/15 [id number]    Clinical Impression Statement Fatigued by end of session.  Did well with ambulation using RW.  Good quality with PREs with short theraputic rest breaks   Pt will benefit from skilled therapeutic intervention in order to improve on the following deficits Abnormal gait;Cardiopulmonary status limiting activity;Decreased activity tolerance;Decreased balance;Decreased mobility;Decreased endurance;Decreased range of motion;Decreased strength;Difficulty walking;Pain   PT Next Visit Plan progress gait/strength and balance        Problem List Patient Active Problem List   Diagnosis Date Noted   Back pain    Urine retention    Back pain at L4-L5 level    Weakness of both legs    AKI  (acute kidney injury)    Anemia due to chronic kidney disease    Low back pain    Cervical radiculopathy    Acute kidney injury 10/24/2014   Anemia 10/24/2014   Diabetic peripheral neuropathy 10/16/2014   Renal transplant, status post 04/05/2014   OSA (obstructive sleep apnea) 04/05/2014   Severe obesity (BMI >= 40) 04/05/2014   Benign fibroma of prostate 02/05/2014   Abdominal apron 04/08/2012   Chronic kidney failure 03/28/2012   Benign essential HTN 03/24/2012   H/O kidney transplant 03/24/2012   Diabetic macular edema 01/15/2012   Diabetes mellitus, type 2 01/15/2012   Diabetic proliferative retinopathy 01/15/2012   HYPERTENSION, HEART CONTROLLED W/O ASSOC CHF 06/27/2010   CHEST PAIN 06/03/2010   IMMUNOCOMPROMISED 01/30/2010   RECTAL PAIN 01/30/2010   NAUSEA AND VOMITING 01/30/2010   DIARRHEA 01/30/2010   KIDNEY TRANSPLANTATION, HX OF 01/30/2010   IDDM 01/27/2010   HYPERPARATHYROIDISM, SECONDARY 01/27/2010   GOUT 01/27/2010   Anxiety state 01/27/2010   GASTROPARESIS 01/27/2010   END STAGE RENAL DISEASE 01/27/2010   FROZEN LEFT SHOULDER 01/27/2010   INSOMNIA 01/27/2010   DIABETIC  RETINOPATHY 05/25/2008   HYPERLIPIDEMIA 05/25/2008   ERECTILE DYSFUNCTION 05/25/2008   OBSTRUCTIVE SLEEP APNEA 05/25/2008   Essential hypertension 05/25/2008    Olean Ree, PTA 03/29/2015, 9:40 AM  [address] [place], [place], [address] Phone: [phone]   Fax:  [phone]

## 2015-04-01 ENCOUNTER — Ambulatory Visit: Payer: Medicare Other | Admitting: Physical Therapy

## 2015-04-01 DIAGNOSIS — R269 Unspecified abnormalities of gait and mobility: Secondary | ICD-10-CM

## 2015-04-01 DIAGNOSIS — R5381 Other malaise: Secondary | ICD-10-CM

## 2015-04-01 DIAGNOSIS — M545 Low back pain, unspecified: Secondary | ICD-10-CM

## 2015-04-01 DIAGNOSIS — R262 Difficulty in walking, not elsewhere classified: Secondary | ICD-10-CM

## 2015-04-01 NOTE — Therapy (Signed)
[place], [place], [address] Phone: [phone]   Fax:  [phone]  Physical Therapy Evaluation  Patient Details  Name: [name] [name] MRN: [id number] Date of Birth: 1966/02/17 Referring Provider:  Kathrynn Ducking, MD  Encounter Date: 04/01/2015      PT End of Session - 04/01/15 1055    Visit Number 7   Date for PT Re-Evaluation 05/06/15   PT Start Time [id number]   PT Stop Time 1112   PT Time Calculation (min) 57 min   Activity Tolerance Patient limited by fatigue;Patient tolerated treatment well   Behavior During Therapy [facility] for tasks assessed/performed      Past Medical History  Diagnosis Date  . Diabetes mellitus   . Hypertension   . Leg pain 02/05/11    with walking  . Weight gain   . Shortness of breath     when lying flat  . Diminished eyesight   . Hearing difficulty   . Chronic kidney disease     Dia;ysis MWF  . Anemia   . Nervousness(799.21)   . COPD (chronic obstructive pulmonary disease)   . CHF (congestive heart failure)   . Posttraumatic stress disorder   . Allergic rhinitis   . Dyslipidemia   . Morbid obesity   . Diabetic peripheral neuropathy   . Gait disorder   . Obstructive sleep apnea   . Bilateral carpal tunnel syndrome   . Anxiety disorder   . Gastroparesis   . Gout   . Diabetic retinopathy   . Renal transplant, status post 04/05/2014  . Incomplete bladder emptying   . Urethral stricture     Past Surgical History  Procedure Laterality Date  . Kidney transplant  2009  . Cataract extraction w/ intraocular lens  implant, bilateral Bilateral   . Av fistula placement Bilateral   . Arteriovenous graft placement Bilateral "several"  . Cystoscopy w/ internal urethrotomy      There were no vitals filed for this visit.  Visit Diagnosis:  Bilateral low back pain without sciatica  Difficulty walking  Abnormality of gait  Debility      Subjective  Assessment - 04/01/15 1023    Subjective Doing well today; having a little bit of low back pain   Pertinent History ESRD, kidney transplant 2009, CVA during the surgery ? according to him   Patient Stated Goals no falls and walk in the home   Currently in Pain? Yes   Pain Score 4    Pain Location Back   Pain Orientation Lower   Pain Descriptors / Indicators Aching   Pain Type Chronic pain   Pain Onset More than a month ago   Pain Frequency Constant   Aggravating Factors  standing/walking   Pain Relieving Factors repositioning                       [facility] Adult PT Treatment/Exercise - 04/01/15 1024    Knee/Hip Exercises: Aerobic   Isokinetic NuStep Level 4x 9 min   Knee/Hip Exercises: Seated   Long Arc Quad Both;2 sets;10 reps;Weights   Long Arc Quad Weight 3 lbs.   Other Seated Knee Exercises marching 3# 2x10; ball squeeze 2x10; hip abdct green tband 2x10   Other Seated Knee Exercises hamstring curls green tband 2 x 10   Modalities   Modalities Electrical Stimulation;Moist Heat   Moist Heat Therapy   Number Minutes Moist Heat 15 Minutes  Moist Heat Location Lumbar Spine   Electrical Stimulation   Electrical Stimulation Location L/S area   Electrical Stimulation Action IFC   Electrical Stimulation Parameters to tolerance x 15 min   Electrical Stimulation Goals Pain                  PT Short Term Goals - 03/13/15 1610    PT SHORT TERM GOAL #1   Title independent with initial HEP   Status On-going           PT Long Term Goals - 04/01/15 1056    PT LONG TERM GOAL #1   Title decrease TUG time to 26 seconds   Status On-going   PT LONG TERM GOAL #2   Title decrease pain 50% when walking   Status On-going   PT LONG TERM GOAL #3   Title sit to stand at all times independently   Status On-going   PT LONG TERM GOAL #4   Title walk with walker 300 feet   Status On-going               Plan - 04/01/15 1055    Clinical Impression  Statement Pt c/o increased low back pain; added heat and estim today to see if pt see relief.  Otherwise tolerates exercises well wil therapeutic rest breaks.   PT Next Visit Plan progress gait/strength and balance   Consulted and Agree with Plan of Care Patient         Problem List Patient Active Problem List   Diagnosis Date Noted  . Back pain   . Urine retention   . Back pain at L4-L5 level   . Weakness of both legs   . AKI (acute kidney injury)   . Anemia due to chronic kidney disease   . Low back pain   . Cervical radiculopathy   . Acute kidney injury 10/24/2014  . Anemia 10/24/2014  . Diabetic peripheral neuropathy 10/16/2014  . Renal transplant, status post 04/05/2014  . OSA (obstructive sleep apnea) 04/05/2014  . Severe obesity (BMI >= 40) 04/05/2014  . Benign fibroma of prostate 02/05/2014  . Abdominal apron 04/08/2012  . Chronic kidney failure 03/28/2012  . Benign essential HTN 03/24/2012  . H/O kidney transplant 03/24/2012  . Diabetic macular edema 01/15/2012  . Diabetes mellitus, type 2 01/15/2012  . Diabetic proliferative retinopathy 01/15/2012  . HYPERTENSION, HEART CONTROLLED W/O ASSOC CHF 06/27/2010  . CHEST PAIN 06/03/2010  . IMMUNOCOMPROMISED 01/30/2010  . RECTAL PAIN 01/30/2010  . NAUSEA AND VOMITING 01/30/2010  . DIARRHEA 01/30/2010  . KIDNEY TRANSPLANTATION, HX OF 01/30/2010  . IDDM 01/27/2010  . HYPERPARATHYROIDISM, SECONDARY 01/27/2010  . GOUT 01/27/2010  . Anxiety state 01/27/2010  . GASTROPARESIS 01/27/2010  . END STAGE RENAL DISEASE 01/27/2010  . FROZEN LEFT SHOULDER 01/27/2010  . INSOMNIA 01/27/2010  . DIABETIC  RETINOPATHY 05/25/2008  . HYPERLIPIDEMIA 05/25/2008  . ERECTILE DYSFUNCTION 05/25/2008  . OBSTRUCTIVE SLEEP APNEA 05/25/2008  . Essential hypertension 05/25/2008   Laureen Abrahams, PT, DPT 04/01/2015 11:17 AM  [address] [place], [place],  [address] Phone: [phone]   Fax:  [phone]

## 2015-04-03 ENCOUNTER — Ambulatory Visit: Payer: Medicare Other | Admitting: Physical Therapy

## 2015-04-03 ENCOUNTER — Encounter: Payer: Self-pay | Admitting: Physical Therapy

## 2015-04-03 DIAGNOSIS — R262 Difficulty in walking, not elsewhere classified: Secondary | ICD-10-CM | POA: Diagnosis not present

## 2015-04-03 DIAGNOSIS — R5381 Other malaise: Secondary | ICD-10-CM

## 2015-04-03 DIAGNOSIS — R269 Unspecified abnormalities of gait and mobility: Secondary | ICD-10-CM

## 2015-04-03 DIAGNOSIS — M545 Low back pain, unspecified: Secondary | ICD-10-CM

## 2015-04-03 NOTE — Therapy (Signed)
[place], [place], [address] Phone: [phone]   Fax:  [phone]  Physical Therapy Treatment  Patient Details  Name: [name] [name] MRN: [id number] Date of Birth: [birth year] Referring Provider:  Kathrynn Ducking, MD  Encounter Date: 04/03/2015      PT End of Session - 04/03/15 0853    Visit Number 8   PT Start Time 0756   PT Stop Time 0905   PT Time Calculation (min) 69 min   Equipment Utilized During Treatment Gait belt   Activity Tolerance Patient limited by fatigue;Patient tolerated treatment well   Behavior During Therapy [facility] for tasks assessed/performed      Past Medical History  Diagnosis Date  . Diabetes mellitus   . Hypertension   . Leg pain 02/05/11    with walking  . Weight gain   . Shortness of breath     when lying flat  . Diminished eyesight   . Hearing difficulty   . Chronic kidney disease     Dia;ysis MWF  . Anemia   . Nervousness(799.21)   . COPD (chronic obstructive pulmonary disease)   . CHF (congestive heart failure)   . Posttraumatic stress disorder   . Allergic rhinitis   . Dyslipidemia   . Morbid obesity   . Diabetic peripheral neuropathy   . Gait disorder   . Obstructive sleep apnea   . Bilateral carpal tunnel syndrome   . Anxiety disorder   . Gastroparesis   . Gout   . Diabetic retinopathy   . Renal transplant, status post 04/05/2014  . Incomplete bladder emptying   . Urethral stricture     Past Surgical History  Procedure Laterality Date  . Kidney transplant  2009  . Cataract extraction w/ intraocular lens  implant, bilateral Bilateral   . Av fistula placement Bilateral   . Arteriovenous graft placement Bilateral "several"  . Cystoscopy w/ internal urethrotomy      There were no vitals filed for this visit.  Visit Diagnosis:  Bilateral low back pain without sciatica  Difficulty walking  Abnormality of gait  Debility       Subjective Assessment - 04/03/15 0801    Subjective Im doing better but I have a little back pain every morning. "If I sit for long in the chair great problem"   Pain Score 5    Pain Location Back   Pain Orientation Lower   Pain Descriptors / Indicators Burning   Pain Type Chronic pain   Pain Onset More than a month ago   Pain Frequency Occasional   Aggravating Factors  Sitting for long periods of time    Pain Relieving Factors repositioning                         [facility] Adult PT Treatment/Exercise - 04/03/15 0001    Ambulation/Gait   Ambulation/Gait Yes   Ambulation/Gait Assistance 4: Min guard   Ambulation Distance (Feet) 60 Feet  X3   Assistive device Rolling walker   Gait Pattern Wide base of support;Poor foot clearance - left   Ambulation Surface Level;Indoor   Knee/Hip Exercises: Aerobic   Isokinetic NuStep Level 4x 7 min  c/o of LBP    Knee/Hip Exercises: Standing   Other Standing Knee Exercises sit to stand X5 with no AD   Other Standing Knee Exercises sit to stand X 5 on airrex    Knee/Hip Exercises: Seated  Long [employer] Both;2 sets;15 reps;Weights   Long [place] Quad Weight 3 lbs.   Other Seated Knee/Hip Exercises marching 3# 2x15, ball squeeze 2x15 3 sec hold; hip abdct blue tband 2x15   Other Seated Knee/Hip Exercises hamstring curls green tband 2 x 15   Modalities   Modalities Electrical Stimulation   Moist Heat Therapy   Number Minutes Moist Heat 15 Minutes   Moist Heat Location Lumbar Spine   Electrical Stimulation   Electrical Stimulation Location L/S area   Electrical Stimulation Action IFC   Electrical Stimulation Parameters to tolerance   Electrical Stimulation Goals Pain                  PT Short Term Goals - 03/13/15 1610    PT SHORT TERM GOAL #1   Title independent with initial HEP   Status On-going           PT Long Term Goals - 04/03/15 0856    PT LONG TERM GOAL #2   Title decrease pain 50% when walking    Status On-going   PT LONG TERM GOAL #3   Title sit to stand at all times independently   Status On-going               Plan - 04/03/15 [id number]    Clinical Impression Statement Pt continues to c/o increased LBP, Pt reports that estim and heat helped from previous treatment. Pt able to tolerated exercises with increased intensities but requires multiple rest breaks/   Pt will benefit from skilled therapeutic intervention in order to improve on the following deficits Abnormal gait;Cardiopulmonary status limiting activity;Decreased activity tolerance;Decreased balance;Decreased mobility;Decreased endurance;Decreased range of motion;Decreased strength;Difficulty walking;Pain   Rehab Potential Good   PT Frequency 2x / week   PT Duration 8 weeks   PT Treatment/Interventions Electrical Stimulation;Cryotherapy;Moist Heat;Ultrasound;Gait training;Functional mobility training;Therapeutic activities;Therapeutic exercise;Balance training;Neuromuscular re-education;Manual techniques;Patient/family education   PT Next Visit Plan progress gait/strength and balance   Consulted and Agree with Plan of Care Patient        Problem List Patient Active Problem List   Diagnosis Date Noted  . Back pain   . Urine retention   . Back pain at L4-L5 level   . Weakness of both legs   . AKI (acute kidney injury)   . Anemia due to chronic kidney disease   . Low back pain   . Cervical radiculopathy   . Acute kidney injury 10/24/2014  . Anemia 10/24/2014  . Diabetic peripheral neuropathy 10/16/2014  . Renal transplant, status post 04/05/2014  . OSA (obstructive sleep apnea) 04/05/2014  . Severe obesity (BMI >= 40) 04/05/2014  . Benign fibroma of prostate 02/05/2014  . Abdominal apron 04/08/2012  . Chronic kidney failure 03/28/2012  . Benign essential HTN 03/24/2012  . H/O kidney transplant 03/24/2012  . Diabetic macular edema 01/15/2012  . Diabetes mellitus, type 2 01/15/2012  . Diabetic proliferative  retinopathy 01/15/2012  . HYPERTENSION, HEART CONTROLLED W/O ASSOC CHF 06/27/2010  . CHEST PAIN 06/03/2010  . IMMUNOCOMPROMISED 01/30/2010  . RECTAL PAIN 01/30/2010  . NAUSEA AND VOMITING 01/30/2010  . DIARRHEA 01/30/2010  . KIDNEY TRANSPLANTATION, HX OF 01/30/2010  . IDDM 01/27/2010  . HYPERPARATHYROIDISM, SECONDARY 01/27/2010  . GOUT 01/27/2010  . Anxiety state 01/27/2010  . GASTROPARESIS 01/27/2010  . END STAGE RENAL DISEASE 01/27/2010  . FROZEN LEFT SHOULDER 01/27/2010  . INSOMNIA 01/27/2010  . DIABETIC  RETINOPATHY 05/25/2008  . HYPERLIPIDEMIA 05/25/2008  . ERECTILE DYSFUNCTION 05/25/2008  .  OBSTRUCTIVE SLEEP APNEA 05/25/2008  . Essential hypertension 05/25/2008    Scot Jun, PTA 04/03/2015, 4:19 PM  [place], [place], [address] Phone: [phone]   Fax:  [phone]

## 2015-04-03 NOTE — Therapy (Deleted)
[place], [place], [address] Phone: [phone]   Fax:  [phone]  Physical Therapy Treatment  Patient Details  Name: [name] [name] MRN: [id number] Date of Birth: 1966-05-15 Referring Provider:  Kathrynn Ducking, MD  Encounter Date: 04/03/2015      PT End of Session - 04/03/15 0853    PT Start Time 0756   PT Stop Time 0905   PT Time Calculation (min) 69 min   Equipment Utilized During Treatment Gait belt   Activity Tolerance Patient limited by fatigue;Patient tolerated treatment well   Behavior During Therapy [facility] for tasks assessed/performed      Past Medical History  Diagnosis Date  . Diabetes mellitus   . Hypertension   . Leg pain 02/05/11    with walking  . Weight gain   . Shortness of breath     when lying flat  . Diminished eyesight   . Hearing difficulty   . Chronic kidney disease     Dia;ysis MWF  . Anemia   . Nervousness(799.21)   . COPD (chronic obstructive pulmonary disease)   . CHF (congestive heart failure)   . Posttraumatic stress disorder   . Allergic rhinitis   . Dyslipidemia   . Morbid obesity   . Diabetic peripheral neuropathy   . Gait disorder   . Obstructive sleep apnea   . Bilateral carpal tunnel syndrome   . Anxiety disorder   . Gastroparesis   . Gout   . Diabetic retinopathy   . Renal transplant, status post 04/05/2014  . Incomplete bladder emptying   . Urethral stricture     Past Surgical History  Procedure Laterality Date  . Kidney transplant  2009  . Cataract extraction w/ intraocular lens  implant, bilateral Bilateral   . Av fistula placement Bilateral   . Arteriovenous graft placement Bilateral "several"  . Cystoscopy w/ internal urethrotomy      There were no vitals filed for this visit.  Visit Diagnosis:  Bilateral low back pain without sciatica  Difficulty walking  Abnormality of gait  Debility      Subjective Assessment -  04/03/15 0801    Subjective Im doing better but I have a little back pain every morning. "If i sit for long in the chair great problem"   Pain Score 5    Pain Location Back   Pain Orientation Lower   Pain Descriptors / Indicators Burning   Pain Type Chronic pain   Pain Onset More than a month ago   Pain Frequency Occasional   Aggravating Factors  Sitting for long periods of time    Pain Relieving Factors repositioning                         [facility] Adult PT Treatment/Exercise - 04/03/15 0001    Ambulation/Gait   Ambulation/Gait Yes   Ambulation/Gait Assistance 4: Min guard   Ambulation Distance (Feet) 60 Feet  X3   Assistive device Rolling walker   Gait Pattern Wide base of support;Poor foot clearance - left   Ambulation Surface Level;Indoor   Knee/Hip Exercises: Aerobic   Isokinetic NuStep Level 4x 7 min  c/o of LBP    Knee/Hip Exercises: Standing   Other Standing Knee Exercises sit to stand X5 with no AD   Other Standing Knee Exercises sit to stand X 5 on air rex    Knee/Hip Exercises: [place]  Quad Both;2 sets;15 reps;Weights   Long [employer] Weight 3 lbs.   Other Seated Knee/Hip Exercises marching 3# 2x15, ball squeeze 2x15 3 sec hold; hip abdct blue tband 2x15   Other Seated Knee/Hip Exercises hamstring curls green tband 2 x 15   Modalities   Modalities Electrical Stimulation   Moist Heat Therapy   Number Minutes Moist Heat 15 Minutes   Moist Heat Location Lumbar Spine   Electrical Stimulation   Electrical Stimulation Location L/S area   Electrical Stimulation Action IFC   Electrical Stimulation Parameters to tolerance   Electrical Stimulation Goals Pain                  PT Short Term Goals - 03/13/15 1610    PT SHORT TERM GOAL #1   Title independent with initial HEP   Status On-going           PT Long Term Goals - 04/03/15 0856    PT LONG TERM GOAL #2   Title decrease pain 50% when walking   Status On-going   PT LONG  TERM GOAL #3   Title sit to stand at all times independently   Status On-going               Plan - 04/03/15 [id number]    Clinical Impression Statement Pt continues to c/o increased LBP, Pt reports that e-stim and heat helped from previous treatment. Pt able to tolerated exercises with increased intensities but requires multiple rest breaks/   Pt will benefit from skilled therapeutic intervention in order to improve on the following deficits Abnormal gait;Cardiopulmonary status limiting activity;Decreased activity tolerance;Decreased balance;Decreased mobility;Decreased endurance;Decreased range of motion;Decreased strength;Difficulty walking;Pain   Rehab Potential Good   PT Frequency 2x / week   PT Duration 8 weeks   PT Treatment/Interventions Electrical Stimulation;Cryotherapy;Moist Heat;Ultrasound;Gait training;Functional mobility training;Therapeutic activities;Therapeutic exercise;Balance training;Neuromuscular re-education;Manual techniques;Patient/family education   PT Next Visit Plan progress gait/strength and balance   Consulted and Agree with Plan of Care Patient        Problem List Patient Active Problem List   Diagnosis Date Noted  . Back pain   . Urine retention   . Back pain at L4-L5 level   . Weakness of both legs   . AKI (acute kidney injury)   . Anemia due to chronic kidney disease   . Low back pain   . Cervical radiculopathy   . Acute kidney injury 10/24/2014  . Anemia 10/24/2014  . Diabetic peripheral neuropathy 10/16/2014  . Renal transplant, status post 04/05/2014  . OSA (obstructive sleep apnea) 04/05/2014  . Severe obesity (BMI >= 40) 04/05/2014  . Benign fibroma of prostate 02/05/2014  . Abdominal apron 04/08/2012  . Chronic kidney failure 03/28/2012  . Benign essential HTN 03/24/2012  . H/O kidney transplant 03/24/2012  . Diabetic macular edema 01/15/2012  . Diabetes mellitus, type 2 01/15/2012  . Diabetic proliferative retinopathy 01/15/2012  .  HYPERTENSION, HEART CONTROLLED W/O ASSOC CHF 06/27/2010  . CHEST PAIN 06/03/2010  . IMMUNOCOMPROMISED 01/30/2010  . RECTAL PAIN 01/30/2010  . NAUSEA AND VOMITING 01/30/2010  . DIARRHEA 01/30/2010  . KIDNEY TRANSPLANTATION, HX OF 01/30/2010  . IDDM 01/27/2010  . HYPERPARATHYROIDISM, SECONDARY 01/27/2010  . GOUT 01/27/2010  . Anxiety state 01/27/2010  . GASTROPARESIS 01/27/2010  . END STAGE RENAL DISEASE 01/27/2010  . FROZEN LEFT SHOULDER 01/27/2010  . INSOMNIA 01/27/2010  . DIABETIC  RETINOPATHY 05/25/2008  . HYPERLIPIDEMIA 05/25/2008  . ERECTILE DYSFUNCTION 05/25/2008  . OBSTRUCTIVE SLEEP  APNEA 05/25/2008  . Essential hypertension 05/25/2008    Scot Jun, PTA 04/03/2015, 9:03 AM  [place], [place], [address] Phone: [phone]   Fax:  [phone]

## 2015-04-04 NOTE — Progress Notes (Signed)
Cardiology Office Note   Date:  04/05/2015   ID:  [name] [name], DOB [birth year], MRN [id number]  Patient Care Team: Elayne Snare, MD as PCP - General (Internal Medicine) Jamal Maes, MD as Consulting Physician (Nephrology) Burnell Blanks, MD as Consulting Physician (Cardiology) Kathrynn Ducking, MD as Consulting Physician (Neurology)      Chief Complaint  Patient presents with  . Aortic Insuffiency     History of Present Illness: [name] [name] is a 49 y.o. [name] male with a hx of IDDM, HTN, hyperlipidemia, OSA, obesity and ESRD s/p kidney transpant in 2009 at [facility] in [place].  Dr. Lauree Chandler evaluated him in 2011 for chest pain.  He is followed by Dr. Jamal Maes at [facility]. Echo in 07/2012 showed moderate LVH c/w hypertensive heart disease. There were no severe valvular abnormalities. Astress myoview in 06/2010 showed a defect in the anterior wall, likely artifact. Normal wall motion and no ischemia. This was a low risk study.  Last seen by Dr. Lauree Chandler in 07/2012.    The patient was admitted earlier this year to [facility] with lumbar discitis. He was discharged to SNF. He was ultimately readmitted to [facility] and then transferred to [facility]. He was thought to have discitis versus osteomyelitis. Blood cultures were positive for enterococcus bacteremia. Echocardiogram demonstrated aortic valve endocarditis with severe aortic insufficiency. Surgical correction was recommended after appropriate IV antibiotics. The patient did not tolerate cardiac catheterization and this was never performed. He has missed several appointments at [facility] and follow-ups with the surgeons to schedule his surgery. I have extensively reviewed his records from [facility] today. The patient has been participating in physical therapy. He followed up with infectious disease last month at [facility]. Follow-up  blood cultures 02/27/15 demonstrated no growth.  Overall, he is doing fairly well. He rides in a scooter. He does not ambulate very well. He denies chest discomfort. He denies any dyspnea. He denies orthopnea, PND or edema. He denies syncope. He denies cough or wheezing. He denies fevers.   Studies/Reports Reviewed Today:  TEE 11/2014 at [place] Left ventricular systolic function is normal. Small echodensity (vegetation) seen on the ventricular side of the left cusp of the aortic valve with partial flail and associated with severe eccentric, aortic regurgitation. Very short PHT 196 msc, and jet occupys > 65% of LVOT There is trace mitral regurgitation. There is no vegetation seen on the mitral valve. There is mild tricuspid regurgitation. There is no tricuspid valve vegetation. Moderate, focal atherosclerotic plaque(s) in the ascending aorta.There is no pericardial effusion.   Echo 11/26/14 SUMMARY Injection of agitated saline contrast performed to evaluate for possible shunt The left atrium is moderately dilated. There is moderate concentric left ventricular hypertrophy. No segmental wall motion abnormalities seen in the left ventricle The right ventricle is normal size. Right ventricular function cannot be assessed due to poor image quality. Right atrium not well visualized. Injection of agitated saline showed no right-to-left shunt. There is aortic valve sclerosis. There is moderate aortic regurgitation. There is mild mitral annular calcification. There is trace mitral regurgitation. Structurally normal tricuspid valve. There is trace tricuspid regurgitation. Structurally normal pulmonic valve. Trace pulmonic valvular regurgitation. Possible increase in thickening of the aortic leaflets with more prominent aortic regurgitation relative to that observed on the prior study from 06/07/2008.   Echo 08/11/12 - Moderate LVH. EF 55%. Grade 1 diastolic dysfunction. - Aortic  valve: There was no  stenosis. - Mitral valve: Moderately calcified annulus. No significantregurgitation. - Left atrium: The atrium was mildly dilated. - Right ventricle: The cavity size was normal. Systolicfunction was normal. - Pulmonary arteries: No complete TR doppler jet so unableto estimate PA systolic pressure. - Systemic veins: IVC was not visualized. Impressions:- Technically difficult study with poor acoustic windows.Moderate LV hypertrophy. EF 55%. Normal RV size andsystolic function. No significant valvular abnormalities.  Myoview 06/2010 Low risk, no ischemia, EF 52% Decreased activity in anterior base/mid-wall-may represent some scar  Past Medical History  Diagnosis Date  . Diabetes mellitus   . Hypertension   . Leg pain 02/05/11    with walking  . Weight gain   . Shortness of breath     when lying flat  . Diminished eyesight   . Hearing difficulty   . Chronic kidney disease     Dia;ysis MWF  . Anemia   . Nervousness(799.21)   . COPD (chronic obstructive pulmonary disease)   . CHF (congestive heart failure)   . Posttraumatic stress disorder   . Allergic rhinitis   . Dyslipidemia   . Morbid obesity   . Diabetic peripheral neuropathy   . Gait disorder   . Obstructive sleep apnea   . Bilateral carpal tunnel syndrome   . Anxiety disorder   . Gastroparesis   . Gout   . Diabetic retinopathy   . Renal transplant, status post 04/05/2014  . Incomplete bladder emptying   . Urethral stricture     Past Surgical History  Procedure Laterality Date  . Kidney transplant  2009  . Cataract extraction w/ intraocular lens  implant, bilateral Bilateral   . Av fistula placement Bilateral   . Arteriovenous graft placement Bilateral "several"  . Cystoscopy w/ internal urethrotomy       Current Outpatient Prescriptions  Medication Sig Dispense Refill  . aspirin 325 MG tablet Take 325 mg by mouth daily.     . baclofen (LIORESAL) 10 MG tablet Take 0.5 tablets (5 mg  total) by mouth 3 (three) times daily. 45 tablet 0  . calcitRIOL (ROCALTROL) 0.5 MCG capsule Take 0.5 mcg by mouth at bedtime.     . clonazePAM (KLONOPIN) 0.5 MG tablet Take 0.5 mg by mouth 3 (three) times daily as needed for anxiety.     . Febuxostat (ULORIC) 80 MG TABS Take 80 mg by mouth daily.    . furosemide (LASIX) 80 MG tablet Take 2 tablets by mouth 2 (two) times daily.    . insulin aspart (NOVOLOG) 100 UNIT/ML injection Inject 0-15 Units into the skin 3 (three) times daily with meals. CBG < 70: implement hypoglycemia protocol CBG 70 - 120: 0 units CBG 121 - 150: 2 units CBG 151 - 200: 3 units CBG 201 - 250: 5 units CBG 251 - 300: 8 units CBG 301 - 350: 11 units CBG 351 - 400: 15 units CBG > 400: call MD (Patient taking differently: Inject 0-15 Units into the skin 5 (five) times daily. CBG < 70: implement hypoglycemia protocol CBG 70 - 120: 0 units CBG 121 - 150: 2 units CBG 151 - 200: 3 units CBG 201 - 250: 5 units CBG 251 - 300: 8 units CBG 301 - 350: 11 units CBG 351 - 400: 15 units CBG > 400: call MD) 10 mL 0  . insulin glargine (LANTUS) 100 UNIT/ML injection Inject 0.4 mLs (40 Units total) into the skin daily. (Patient taking differently: Inject 90 Units into the skin 3 (three) times daily. )  10 mL 0  . mycophenolate (CELLCEPT) 250 MG capsule Take 750 mg by mouth 2 (two) times daily.     Marland Kitchen omeprazole (PRILOSEC) 20 MG capsule Take 20 mg by mouth daily.    . potassium chloride SA (K-DUR,KLOR-CON) 20 MEQ tablet Take 2 tablets by mouth 2 (two) times daily.    . predniSONE (DELTASONE) 5 MG tablet Take 5 mg by mouth daily.      . rosuvastatin (CRESTOR) 20 MG tablet Take 20 mg by mouth at bedtime.     . tacrolimus (PROGRAF) 0.5 MG capsule Take 1 mg by mouth 2 (two) times daily.      No current facility-administered medications for this visit.    Allergies:   Penicillins; Pork-derived products; and Ace inhibitors    Social History:  The patient  reports that he has never  smoked. He has never used smokeless tobacco. He reports that he does not drink alcohol or use illicit drugs.    Family History:  The patient's family history includes Diabetes in his father; Hypertension in his mother.    ROS:   Please see the history of present illness.   Review of Systems  Musculoskeletal: Positive for back pain and muscle weakness.  Neurological: Positive for loss of balance.  All other systems reviewed and are negative.     PHYSICAL EXAM: VS:  BP 150/60 mmHg  Pulse 64  Ht 6\' 1"  (1.854 m)  Wt 263 lb (119.296 kg)  BMI 34.71 kg/m2    Wt Readings from Last 3 Encounters:  04/05/15 263 lb (119.296 kg)  11/06/14 258 lb (117.028 kg)  11/01/14 258 lb (117.028 kg)     GEN: Well nourished, well developed, ariving in a scooter, in no acute distress HEENT: normal Neck: no JVD at 90, no masses Cardiac:  Normal S1/S2, RRR; 2/6 diastolic murmur LLSB,  no rubs or gallops, no edema   Respiratory:  clear to auscultation bilaterally, no wheezing, rhonchi or rales. GI: soft, nontender, nondistended, + BS MS: no deformity or atrophy Skin: warm and dry  Neuro:  CNs II-XII intact, Strength and sensation are intact Psych: Normal affect   EKG:  EKG is ordered today.  It demonstrates:   NSR, HR 75, LAD, IVCD, T-wave inversions in 1, aVL, V5-V6, no change from prior tracings   Recent Labs: 10/24/2014: ALT 12 11/22/2014: BUN 41*; Creatinine, Ser 1.90*; Hemoglobin 11.9*; Platelets 281; Potassium 4.6; Sodium 139  Labs 02/07/15 (from nephrology): TC 161, TG 61, HDL 39, LDL 110 Labs 03/20/15 (from nephrology): Creatinine 1.81, K 4.5, ALT 6, Hgb 10.9  Lipid Panel    Component Value Date/Time   CHOL  12/12/2007 0724    121        ATP III CLASSIFICATION:  <200     mg/dL   Desirable  200-239  mg/dL   Borderline High  >=240    mg/dL   High   TRIG 175* 12/12/2007 0724   HDL 31* 12/12/2007 0724   CHOLHDL 3.9 12/12/2007 0724   VLDL 35 12/12/2007 0724   LDLCALC  12/12/2007 0724      55        Total Cholesterol/HDL:CHD Risk Coronary Heart Disease Risk Table                     Men   Women  1/2 Average Risk   3.4   3.3      ASSESSMENT AND PLAN:  Aortic insufficiency:  Patient was noted to have  severe aortic insufficiency at the time of TEE during his hospitalization for enterococcal bacteremia. Surgery was recommended. However, he declined and failed to follow-up with the surgeons at [facility]. He comes in today to get a second opinion from our team. Overall, he is doing fairly well. He does not have any signs or symptoms of heart failure at this point in time. I will obtain a follow-up echocardiogram. We discussed the possibility that his aortic insufficiency has not gotten any better and that he would likely need referral to cardiothoracic surgery if this is the case.  Endocarditis: He underwent treatment with IV antibiotics for 6 weeks. He last saw infectious disease at [facility] last month.  Hypertensive heart disease with heart failure : Blood pressure managed by nephrology. He takes clonidine as needed.  Chronic diastolic CHF (congestive heart failure): Volume managed by nephrology.  HLD (hyperlipidemia): He remains on statin therapy.  End stage renal disease: He is status post renal transplant. Recent creatinine stable.     Medication Changes: Current medicines are reviewed at length with the patient today.  Concerns regarding medicines are as outlined above.  The following changes have been made:   Discontinued Medications   ALLOPURINOL (ZYLOPRIM) 100 MG TABLET    Take 100 mg by mouth daily. Take with the 300mg  tablet to make a total of 400mg  daily per patient   ALLOPURINOL (ZYLOPRIM) 300 MG TABLET    Take 300 mg by mouth daily. Take with the 100mg  tablet to make a total dose of 400mg  a day per patient   AMLODIPINE (NORVASC) 5 MG TABLET    Take 1 tablet (5 mg total) by mouth daily.   FEEDING SUPPLEMENT, ENSURE COMPLETE, (ENSURE COMPLETE) LIQD    Take  237 mLs by mouth 2 (two) times daily between meals.   HYDROCODONE-ACETAMINOPHEN (NORCO/VICODIN) 5-325 MG PER TABLET    Take 1-2 tablets by mouth every 6 (six) hours as needed for moderate pain or severe pain.   Modified Medications   No medications on file   New Prescriptions   No medications on file     Labs/ tests ordered today include:   Orders Placed This Encounter  Procedures  . EKG 12-Lead  . Echocardiogram     Disposition:   FU with Dr. Angelena Form one month.   Signed, Versie Starks, MHS 04/05/2015 10:58 AM    [facility] [place], [place], [place]  [address] Phone: [phone]; Fax: [phone]

## 2015-04-05 ENCOUNTER — Ambulatory Visit (INDEPENDENT_AMBULATORY_CARE_PROVIDER_SITE_OTHER): Payer: Medicare Other | Admitting: Physician Assistant

## 2015-04-05 ENCOUNTER — Encounter: Payer: Self-pay | Admitting: Physician Assistant

## 2015-04-05 ENCOUNTER — Encounter: Payer: Self-pay | Admitting: *Deleted

## 2015-04-05 VITALS — BP 150/60 | HR 64 | Ht 73.0 in | Wt 263.0 lb

## 2015-04-05 DIAGNOSIS — E785 Hyperlipidemia, unspecified: Secondary | ICD-10-CM

## 2015-04-05 DIAGNOSIS — I351 Nonrheumatic aortic (valve) insufficiency: Secondary | ICD-10-CM

## 2015-04-05 DIAGNOSIS — I5032 Chronic diastolic (congestive) heart failure: Secondary | ICD-10-CM

## 2015-04-05 DIAGNOSIS — G4733 Obstructive sleep apnea (adult) (pediatric): Secondary | ICD-10-CM

## 2015-04-05 DIAGNOSIS — I119 Hypertensive heart disease without heart failure: Secondary | ICD-10-CM

## 2015-04-05 DIAGNOSIS — I110 Hypertensive heart disease with heart failure: Secondary | ICD-10-CM

## 2015-04-05 DIAGNOSIS — I38 Endocarditis, valve unspecified: Secondary | ICD-10-CM

## 2015-04-05 DIAGNOSIS — I509 Heart failure, unspecified: Secondary | ICD-10-CM | POA: Diagnosis not present

## 2015-04-05 DIAGNOSIS — N186 End stage renal disease: Secondary | ICD-10-CM

## 2015-04-05 NOTE — Patient Instructions (Signed)
Medication Instructions:  Your physician recommends that you continue on your current medications as directed. Please refer to the Current Medication list given to you today.   Labwork: NONE  Testing/Procedures: Your physician has requested that you have an echocardiogram. Echocardiography is a painless test that uses sound waves to create images of your heart. It provides your doctor with information about the size and shape of your heart and how well your heart's chambers and valves are working. This procedure takes approximately one hour. There are no restrictions for this procedure.   Follow-Up: DR. Angelena Form IN 1 MONTH  Any Other Special Instructions Will Be Listed Below (If Applicable).

## 2015-04-08 ENCOUNTER — Ambulatory Visit: Payer: Medicare Other | Admitting: Physical Therapy

## 2015-04-08 ENCOUNTER — Encounter: Payer: Self-pay | Admitting: Physical Therapy

## 2015-04-08 DIAGNOSIS — R5381 Other malaise: Secondary | ICD-10-CM | POA: Diagnosis not present

## 2015-04-08 DIAGNOSIS — M545 Low back pain, unspecified: Secondary | ICD-10-CM

## 2015-04-08 DIAGNOSIS — R262 Difficulty in walking, not elsewhere classified: Secondary | ICD-10-CM | POA: Diagnosis not present

## 2015-04-08 DIAGNOSIS — R269 Unspecified abnormalities of gait and mobility: Secondary | ICD-10-CM | POA: Diagnosis not present

## 2015-04-08 NOTE — Therapy (Signed)
[address] [place] [address] [place], [place], [address] Phone: [phone]   Fax:  [phone]  Physical Therapy Treatment  Patient Details  Name: [name] [name] MRN: [id number] Date of Birth: 1966/03/16 Referring Provider:  Kathrynn Ducking, MD  Encounter Date: 04/08/2015      PT End of Session - 04/08/15 1057    Visit Number 9   PT Start Time 1013   PT Stop Time [id number]   PT Time Calculation (min) 59 min   Activity Tolerance Patient limited by fatigue;Patient tolerated treatment well   Behavior During Therapy [facility] for tasks assessed/performed      Past Medical History  Diagnosis Date  . Diabetes mellitus   . Hypertension   . Leg pain 02/05/11    with walking  . Weight gain   . Shortness of breath     when lying flat  . Diminished eyesight   . Hearing difficulty   . Chronic kidney disease     Dia;ysis MWF  . Anemia   . Nervousness(799.21)   . COPD (chronic obstructive pulmonary disease)   . CHF (congestive heart failure)   . Posttraumatic stress disorder   . Allergic rhinitis   . Dyslipidemia   . Morbid obesity   . Diabetic peripheral neuropathy   . Gait disorder   . Obstructive sleep apnea   . Bilateral carpal tunnel syndrome   . Anxiety disorder   . Gastroparesis   . Gout   . Diabetic retinopathy   . Renal transplant, status post 04/05/2014  . Incomplete bladder emptying   . Urethral stricture     Past Surgical History  Procedure Laterality Date  . Kidney transplant  2009  . Cataract extraction w/ intraocular lens  implant, bilateral Bilateral   . Av fistula placement Bilateral   . Arteriovenous graft placement Bilateral "several"  . Cystoscopy w/ internal urethrotomy      There were no vitals filed for this visit.  Visit Diagnosis:  Bilateral low back pain without sciatica  Abnormality of gait  Difficulty walking      Subjective Assessment - 04/08/15 1013    Subjective Ive been  doing up and down, Im having trouble betting out of bed because of back pain    Pertinent History ESRD, kidney transplant 2009, CVA during the surgery ? according to him   Diagnostic tests MRI negative   Patient Stated Goals no falls and walk in the home   Currently in Pain? No/denies   Pain Score 0-No pain   Pain Location Back   Pain Orientation Lower   Pain Descriptors / Indicators Discomfort   Pain Type Chronic pain   Pain Onset More than a month ago   Pain Frequency Occasional   Aggravating Factors  sitting for long periods of time                          [facility] Adult PT Treatment/Exercise - 04/08/15 0001    Ambulation/Gait   Ambulation/Gait Yes   Ambulation/Gait Assistance 6: Modified independent (Device/Increase time)   Ambulation Distance (Feet) 100 Feet   Assistive device Rolling walker   Gait Pattern Wide base of support;Poor foot clearance - left;Trunk flexed   Ambulation Surface Level;Indoor   Knee/Hip Exercises: Aerobic   Isokinetic NuStep  Pt unable to tolerated this date due to LBP   Knee/Hip Exercises: Standing   Other Standing Knee Exercises sit to stand X  5 on airrex    Knee/Hip Exercises: Seated   Long Arc Quad Both;2 sets;15 reps;Weights  with RW   Long Arc Quad Weight 4 lbs.   Other Seated Knee/Hip Exercises marching 4# 2x15, ball squeeze 2x15 3 sec hold; hip abdct blue tband 2x15   Other Seated Knee/Hip Exercises hamstring curls blue tband 2 x 15   Modalities   Modalities Electrical Stimulation   Moist Heat Therapy   Number Minutes Moist Heat 15 Minutes   Electrical Stimulation   Electrical Stimulation Action IFC   Electrical Stimulation Parameters to tolerance    Electrical Stimulation Goals Pain                  PT Short Term Goals - 04/08/15 1059    PT SHORT TERM GOAL #1   Title independent with initial HEP   Status Achieved           PT Long Term Goals - 04/08/15 1100    PT LONG TERM GOAL #2   Title decrease  pain 50% when walking   Status On-going               Plan - 04/08/15 1058    Clinical Impression Statement Pt unable to complete NuStep intervention due to subjective complaints of LBP. Pt able to tolerate other interventions with increased intensities    Pt will benefit from skilled therapeutic intervention in order to improve on the following deficits Abnormal gait;Cardiopulmonary status limiting activity;Decreased activity tolerance;Decreased balance;Decreased mobility;Decreased endurance;Decreased range of motion;Decreased strength;Difficulty walking;Pain   PT Frequency 2x / week   PT Duration 8 weeks   PT Next Visit Plan progress gait/strength and balance   Consulted and Agree with Plan of Care Patient        Problem List Patient Active Problem List   Diagnosis Date Noted  . Back pain   . Urine retention   . Back pain at L4-L5 level   . Weakness of both legs   . AKI (acute kidney injury)   . Anemia due to chronic kidney disease   . Low back pain   . Cervical radiculopathy   . Acute kidney injury 10/24/2014  . Anemia 10/24/2014  . Diabetic peripheral neuropathy 10/16/2014  . Renal transplant, status post 04/05/2014  . OSA (obstructive sleep apnea) 04/05/2014  . Severe obesity (BMI >= 40) 04/05/2014  . Benign fibroma of prostate 02/05/2014  . Abdominal apron 04/08/2012  . Chronic kidney failure 03/28/2012  . Benign essential HTN 03/24/2012  . H/O kidney transplant 03/24/2012  . Diabetic macular edema 01/15/2012  . Diabetes mellitus, type 2 01/15/2012  . Diabetic proliferative retinopathy 01/15/2012  . HYPERTENSION, HEART CONTROLLED W/O ASSOC CHF 06/27/2010  . CHEST PAIN 06/03/2010  . IMMUNOCOMPROMISED 01/30/2010  . RECTAL PAIN 01/30/2010  . NAUSEA AND VOMITING 01/30/2010  . DIARRHEA 01/30/2010  . KIDNEY TRANSPLANTATION, HX OF 01/30/2010  . IDDM 01/27/2010  . HYPERPARATHYROIDISM, SECONDARY 01/27/2010  . GOUT 01/27/2010  . Anxiety state 01/27/2010  .  GASTROPARESIS 01/27/2010  . END STAGE RENAL DISEASE 01/27/2010  . FROZEN LEFT SHOULDER 01/27/2010  . INSOMNIA 01/27/2010  . DIABETIC  RETINOPATHY 05/25/2008  . HLD (hyperlipidemia) 05/25/2008  . ERECTILE DYSFUNCTION 05/25/2008  . OBSTRUCTIVE SLEEP APNEA 05/25/2008  . Essential hypertension 05/25/2008    Scot Jun 04/08/2015, 11:01 AM  [address] [place], [place], [address] Phone: [phone]   Fax:  [phone]

## 2015-04-09 ENCOUNTER — Other Ambulatory Visit: Payer: Self-pay

## 2015-04-09 ENCOUNTER — Encounter: Payer: Self-pay | Admitting: Endocrinology

## 2015-04-09 ENCOUNTER — Other Ambulatory Visit: Payer: Self-pay | Admitting: *Deleted

## 2015-04-09 ENCOUNTER — Ambulatory Visit (INDEPENDENT_AMBULATORY_CARE_PROVIDER_SITE_OTHER): Payer: Medicare Other | Admitting: Endocrinology

## 2015-04-09 VITALS — BP 160/30 | HR 82 | Temp 97.8°F | Resp 16 | Ht 73.0 in | Wt 260.6 lb

## 2015-04-09 DIAGNOSIS — IMO0002 Reserved for concepts with insufficient information to code with codable children: Secondary | ICD-10-CM

## 2015-04-09 DIAGNOSIS — G629 Polyneuropathy, unspecified: Secondary | ICD-10-CM | POA: Diagnosis not present

## 2015-04-09 DIAGNOSIS — E1129 Type 2 diabetes mellitus with other diabetic kidney complication: Secondary | ICD-10-CM

## 2015-04-09 DIAGNOSIS — E1165 Type 2 diabetes mellitus with hyperglycemia: Secondary | ICD-10-CM | POA: Diagnosis not present

## 2015-04-09 DIAGNOSIS — M25511 Pain in right shoulder: Secondary | ICD-10-CM | POA: Diagnosis not present

## 2015-04-09 DIAGNOSIS — E1142 Type 2 diabetes mellitus with diabetic polyneuropathy: Secondary | ICD-10-CM | POA: Diagnosis not present

## 2015-04-09 LAB — POCT GLUCOSE (DEVICE FOR HOME USE): Glucose Fasting, POC: 84 mg/dL (ref 70–99)

## 2015-04-09 MED ORDER — INSULIN GLARGINE 300 UNIT/ML ~~LOC~~ SOPN: 90.0000 [IU] | PEN_INJECTOR | Freq: Two times a day (BID) | SUBCUTANEOUS | Status: DC

## 2015-04-09 MED ORDER — INSULIN REGULAR HUMAN (CONC) 500 UNIT/ML ~~LOC~~ SOLN: SUBCUTANEOUS | Status: DC

## 2015-04-09 MED ORDER — INSULIN REGULAR HUMAN (CONC) 500 UNIT/ML ~~LOC~~ SOPN: 10.0000 [IU] | PEN_INJECTOR | Freq: Once | SUBCUTANEOUS | Status: DC

## 2015-04-09 MED ORDER — INSULIN GLARGINE 300 UNIT/ML ~~LOC~~ SOPN
90.0000 [IU] | PEN_INJECTOR | Freq: Two times a day (BID) | SUBCUTANEOUS | Status: DC
Start: 2015-04-09 — End: 2015-08-26

## 2015-04-09 MED ORDER — INSULIN GLARGINE 300 UNIT/ML ~~LOC~~ SOPN
90.0000 [IU] | PEN_INJECTOR | Freq: Two times a day (BID) | SUBCUTANEOUS | Status: DC
Start: 2015-04-09 — End: 2015-04-09

## 2015-04-09 MED ORDER — GLUCOSE BLOOD VI STRP
ORAL_STRIP | Status: DC
Start: 2015-04-09 — End: 2015-04-09

## 2015-04-09 MED ORDER — GLUCOSE BLOOD VI STRP: ORAL_STRIP | Status: DC

## 2015-04-09 MED ORDER — INSULIN PEN NEEDLE 32G X 4 MM MISC: Status: DC

## 2015-04-09 MED ORDER — ACCU-CHEK FASTCLIX LANCETS MISC: Status: DC

## 2015-04-09 NOTE — Progress Notes (Signed)
Patient ID: [name] [name], male   DOB: 1966/03/02, 49 y.o.   MRN: [id number]           Reason for Appointment: Consultation for Type 2 Diabetes  Referring physician: none  History of Present Illness:          Date of diagnosis of type 2 diabetes mellitus : 1990       Background history:  He has been taking insulin since about 1999, previously was on unknown oral agents He had good control with insulin initially but A1c was much higher in 2008 and subsequently has had required larger doses of insulin He has been on Lantus for the last few years along with Novolog,followed by an endocrinologist since 2008 His A1c was 6.3 in 04/2014  Recent history:  He last saw his endocrinologist in 9/15 and did not follow-up because of his hospitalization in 1/16 He continues to take Novolog 5 times a day with each meal and snack using 50 units as a base amount and correction doses of 8-18 units if blood sugars are not low He takes the Lantus with his meals Has not taken any oral agents or GLP-1 drugs in the past  INSULIN regimen is described as: Novolog 50, 5x daily with insulin pen, Lantus 90 units ac tid with a syringe     Current blood sugar patterns and problems identified:  He has had inconsistent supply of his test strips and has not tested any sugars in the last week or so and did not bring his Accu-Chek monitor   He is somewhat inconsistent and remembering what his blood sugars are at home but he thinks they are usually fairly good in the mornings without overnight hypoglycemia  He may occasionally tend to get hypoglycemia before lunch and was hypoglycemic in the office today midmorning.  He thinks his blood sugars are much higher around supper time despite taking his insulin consistently at lunch and midafternoon snack  He has generally been fairly good with his diet and he thinks he is following a Mediterranean diet; although he does have prior's frequently he thinks this is small  portions.  He has some protein at each meal and as usually try to follow instructions given by dietitian previously.  However he has snacks in between meals, sometimes fruit  Does not check his blood sugar after his evening meal  He has been generally trying to eat more recently and he thinks this is to help his anemia and has gained 20 pounds  Oral hypoglycemic drugs the patient is taking are: none      Compliance with the medical regimen: usually good Hypoglycemia: occasionally at lunch   Glucose monitoring:  done 3 times a day         Glucometer: Aviva      Blood Glucose readings by time of day by recall   PREMEAL Breakfast Lunch Dinner Bedtime  Overall   Glucose range: 90s 145-180 250 ?   Median:         Self-care: The diet that the patient has been following is: tries to limit high-fat meals, usually eating 3 meals a day.     Typical meal intake: Breakfast is grits with pecans, olive oil               Dietician visit, most recent:2013               Exercise:  unable to do any  Weight history:  Wt Readings from Last 3  Encounters:  04/09/15 260 lb 9.6 oz (118.207 kg)  04/05/15 263 lb (119.296 kg)  11/06/14 258 lb (117.028 kg)    Glycemic control:   Lab Results  Component Value Date   HGBA1C 5.9* 10/24/2014   HGBA1C * 12/31/2009    8.7 (NOTE) The ADA recommends the following therapeutic goal for glycemic control related to Hgb A1c measurement: Goal of therapy: <6.5 Hgb A1c  Reference: American Diabetes Association: Clinical Practice Recommendations 2010, Diabetes Care, 2010, 33: (Suppl  1).   Lab Results  Component Value Date   [facility]  12/12/2007    55        Total Cholesterol/HDL:CHD Risk Coronary Heart Disease Risk Table                     Men   Women  1/2 Average Risk   3.4   3.3   CREATININE 1.90* 11/22/2014         Medication List       This list is accurate as of: 04/09/15  4:24 PM.  Always use your most recent med list.                ACCU-CHEK FASTCLIX LANCETS Misc  Use to check blood sugar 4 times per day dx code E11.9     aspirin 81 MG tablet  Take 81 mg by mouth daily.     aspirin 325 MG tablet  Take 325 mg by mouth daily.     calcitRIOL 0.5 MCG capsule  Commonly known as:  ROCALTROL  Take 0.5 mcg by mouth at bedtime.     clonazePAM 0.5 MG tablet  Commonly known as:  KLONOPIN  Take 0.5 mg by mouth 3 (three) times daily as needed for anxiety.     furosemide 80 MG tablet  Commonly known as:  LASIX  Take 2 tablets by mouth 2 (two) times daily.     glucose blood test strip  Commonly known as:  ACCU-CHEK SMARTVIEW  Use as instructed to check blood sugar 4 times per day dx code E11.9     insulin aspart 100 UNIT/ML injection  Commonly known as:  novoLOG  Inject 0-15 Units into the skin 3 (three) times daily with meals. CBG < 70: implement hypoglycemia protocol CBG 70 - 120: 0 units CBG 121 - 150: 2 units CBG 151 - 200: 3 units CBG 201 - 250: 5 units CBG 251 - 300: 8 units CBG 301 - 350: 11 units CBG 351 - 400: 15 units CBG > 400: call MD     Insulin Glargine 300 UNIT/ML Sopn  Commonly known as:  TOUJEO SOLOSTAR  Inject 90 Units into the skin 2 (two) times daily.     Insulin Pen Needle 32G X 4 MM Misc  Commonly known as:  BD PEN NEEDLE NANO U/F  Use 5 per day     insulin regular human CONCENTRATED 500 UNIT/ML injection  Commonly known as:  HUMULIN R  Inject 10 units as marked on syringe 30 minutes before breakfast, 25 units 30 minutes before lunch and 20 units 30 minutes before supper     mycophenolate 250 MG capsule  Commonly known as:  CELLCEPT  Take 750 mg by mouth 2 (two) times daily.     omeprazole 20 MG capsule  Commonly known as:  PRILOSEC  Take 20 mg by mouth daily.     potassium chloride SA 20 MEQ tablet  Commonly known as:  K-DUR,KLOR-CON  Take 2 tablets  by mouth 2 (two) times daily.     predniSONE 5 MG tablet  Commonly known as:  DELTASONE  Take 5 mg by mouth daily.     pregabalin 50  MG capsule  Commonly known as:  LYRICA  Take 50 mg by mouth daily.     rosuvastatin 20 MG tablet  Commonly known as:  CRESTOR  Take 20 mg by mouth at bedtime.     tacrolimus 0.5 MG capsule  Commonly known as:  PROGRAF  Take 1 mg by mouth 2 (two) times daily.     ULORIC 80 MG Tabs  Generic drug:  Febuxostat  Take 80 mg by mouth daily.        Allergies:  Allergies  Allergen Reactions  . Penicillins Rash and Other (See Comments)    fever  . Pork-Derived Products Anaphylaxis and Other (See Comments)    Fever   . Ace Inhibitors Cough    Past Medical History  Diagnosis Date  . Diabetes mellitus   . Hypertension   . Leg pain 02/05/11    with walking  . Weight gain   . Shortness of breath     when lying flat  . Diminished eyesight   . Hearing difficulty   . Chronic kidney disease     Dia;ysis MWF  . Anemia   . Nervousness(799.21)   . COPD (chronic obstructive pulmonary disease)   . CHF (congestive heart failure)   . Posttraumatic stress disorder   . Allergic rhinitis   . Dyslipidemia   . Morbid obesity   . Diabetic peripheral neuropathy   . Gait disorder   . Obstructive sleep apnea   . Bilateral carpal tunnel syndrome   . Anxiety disorder   . Gastroparesis   . Gout   . Diabetic retinopathy   . Renal transplant, status post 04/05/2014  . Incomplete bladder emptying   . Urethral stricture     Past Surgical History  Procedure Laterality Date  . Kidney transplant  2009  . Cataract extraction w/ intraocular lens  implant, bilateral Bilateral   . Av fistula placement Bilateral   . Arteriovenous graft placement Bilateral "several"  . Cystoscopy w/ internal urethrotomy      Family History  Problem Relation Age of Onset  . Hypertension Mother   . Diabetes Father     Social History:  reports that he has never smoked. He has never used smokeless tobacco. He reports that he does not drink alcohol or use illicit drugs.    Review of Systems    Lipid history:  He is on 20 mg Crestor Lipids checked by nephrologist show cholesterol 161, triglycerides 61, HDL 39 and LDL 110 in 03/2015    Lab Results  Component Value Date   CHOL  12/12/2007    121        ATP III CLASSIFICATION:  <200     mg/dL   Desirable  200-239  mg/dL   Borderline High  >=240    mg/dL   High   HDL 31* 12/12/2007   LDLCALC  12/12/2007    55        Total Cholesterol/HDL:CHD Risk Coronary Heart Disease Risk Table                     Men   Women  1/2 Average Risk   3.4   3.3   TRIG 175* 12/12/2007   CHOLHDL 3.9 12/12/2007  Constitutional: no recent weight gain/loss, he has had some fatigue  He has had anemia and not clear of the etiology, last hemoglobin was 10.9 from nephrologist.  Not on any supplements  Eyes: no history of blurred vision.  Most recent eye exam was 6/15  ENT: no nasal congestion, difficulty swallowing  Cardiovascular: no chest pain or tightness on exertion.  No leg swelling.  Hypertension:not present, does take Lasix for control of edema from nephrologist  RENAL: He has had a transplant in 2009 and last creatinine was 1.8.  Also is being followed for secondary hyperparathyroidism with last PTH level 148  Respiratory: no cough/shortness of breath.  Has history of sleep apnea  Gastrointestinal: no constipation, diarrhea, nausea or abdominal pain  Musculoskeletal: no muscle/joint aches, does have significant back pain.  Also apparently has history of gout   Urological:   No frequency of urination, occasional nocturia  Skin: no rash or infections  Neurological: no headaches.  Has has had some decreased sensation in his feet, no burning.  Sometimes gets sharp pains in his feet and toes, no tingling He has been given Lyrica by his neurologist and takes 50 mg at night with only some relief Previously had relief with gabapentin but he thinks he had urinary incontinence with this.  Not clear if he had any benefit from Cymbalta He has had  marked decrease in his balance and also some weakness in his legs, using a power scooter  Psychiatric: no symptoms of depression  Endocrine: No unusual fatigue, cold intolerance or history of thyroid disease   LABS:  Office Visit on 04/09/2015  Component Date Value Ref Range Status  . Glucose Fasting, POC 04/09/2015 84  70 - 99 mg/dL Final    Physical Examination:  BP 160/30 mmHg  Pulse 82  Temp(Src) 97.8 F (36.6 C)  Resp 16  Ht 6\' 1"  (1.854 m)  Wt 260 lb 9.6 oz (118.207 kg)  BMI 34.39 kg/m2  SpO2 97%  GENERAL:         Patient has generalized obesity.   HEENT:         Eye exam shows normal external appearance. Fundus exam shows bilateral laser scars retinopathy.  Oral exam shows normal mucosa .  NECK:   There is no lymphadenopathy Thyroid is not enlarged and no nodules felt.  Carotids are normal to palpation and no bruit heard LUNGS:         Chest is symmetrical. Lungs are clear to auscultation.Marland Kitchen   HEART:         Heart sounds:  S1 and S2 are normal. No murmurs or clicks heard., no S3 or S4.   ABDOMEN:   Significantly obese. There is no distention present. Liver and spleen are not palpable. No other mass or tenderness present.   NEUROLOGICAL:   Vibration sense is markedly reduced in distal first toes. Ankle jerks are absent bilaterally.          Absent monofilament sensation in his feet and toes and extending up to the middle of the lower leg on the left and up to the ankle on the right Appears to have some weakness in the left foot with decreased dorsiflexion and overall appears to have mild weakness in his legs bilaterally MUSCULOSKELETAL:  There is no swelling or deformity of the peripheral joints. Spine is normal to inspection.   EXTREMITIES:     There is no edema. No skin lesions present.. Pedal pulses are normal SKIN:  No rash or lesions of concern.        ASSESSMENT:  Diabetes type 2, long-standing with obesity Although he reports fairly good A1c levels in the  last year or so he does have relatively high blood sugars by recall at home especially in the afternoons A1c may not be accurate because of his renal failure and anemia He is markedly insulin resistant and requiring almost 600 units of insulin a day for quite some time Also following a complicated insulin regimen of Novolog 5 times a day and Lantus 3 times a day    Complications:neuropathy, nephropathy and retinopathy as discussed above  History of renal failure treated with transplant and has continued renal dysfunction and secondary hyperparathyroidism followed by nephrologist regularly  HYPERLIPIDEMIA: Not adequately controlled with LDL still over 100, not clear which physician is following this  PLAN:   Switch Novolog to the U-500 insulin 3 times a day before meals, to take 30 minutes before eating.  Discussed in detail the concentrated nature of the insulin and 1:5 conversion factor for doses.  Also discussed onset and duration of action and adjustment of the doses.  Initially we'll try him on a total of about 55 units of insulin a day equivalent to 275 units of U-100 insulin and adjust accordingly  He will switch from Lantus to U-300 Toujeo, 90 units twice a day with a pen instead of syringe and may need reduction in dose because of using U-500 insulin also.  Accu-Chek Nano meter was given to replace his old Aviva meter  Discussed he needs to check blood sugars in between our after meals also in addition to before meals at times especially after supper  He needs to bring his monitor for download  PRESCRIPTIONS given along with co-pay cards and direct information on new insulin formulations  For neuropathy he may increase Lyrica to 100 mg at night for better control of symptoms and follow-up with neurologist as before  More consistent diet with reduction in portions, snacks and carbohydrates.  Follow-up with dietitian   Consider adding Victoza for improving control and obesity and  reducing insulin requirement if blood sugars are not improved  To follow level of control with fructosamine in addition to A1c  Patient Instructions  U-500 insulin 10 before bfst, 25 before lunch and 20 before dinner, take 30 min before Sliding scale use for high sugars:  Sugar 150-199 take 2 units, 200-250= 4 units and over 250 take 6 units  TOUJEO 90 units twice daily  Check blood sugars on waking up .Marland Kitchen Daily, some before meals Also check blood sugars about 2 hours after a meal and do this after different meals by rotation  Recommended blood sugar levels on waking up is 90-130 and about 2 hours after meal is 140-180 Please bring blood sugar monitor to each visit.     Counseling time on subjects discussed above is over 50% of today's 60 minute visit  Cady Hafen 04/09/2015, 4:24 PM   Note: This office note was prepared with Dragon voice recognition system technology. Any transcriptional errors that result from this process are unintentional.

## 2015-04-09 NOTE — Patient Instructions (Addendum)
U-500 insulin 10 before bfst, 25 before lunch and 20 before dinner, take 30 min before Sliding scale use for high sugars:  Sugar 150-199 take 2 units, 200-250= 4 units and over 250 take 6 units  TOUJEO 90 units twice daily  Check blood sugars on waking up .Marland Kitchen Daily, some before meals Also check blood sugars about 2 hours after a meal and do this after different meals by rotation  Recommended blood sugar levels on waking up is 90-130 and about 2 hours after meal is 140-180 Please bring blood sugar monitor to each visit.

## 2015-04-10 ENCOUNTER — Other Ambulatory Visit (HOSPITAL_COMMUNITY): Payer: Self-pay

## 2015-04-11 ENCOUNTER — Encounter: Payer: Self-pay | Admitting: Physical Therapy

## 2015-04-11 ENCOUNTER — Ambulatory Visit (INDEPENDENT_AMBULATORY_CARE_PROVIDER_SITE_OTHER): Payer: Medicare Other | Admitting: Internal Medicine

## 2015-04-11 ENCOUNTER — Ambulatory Visit: Payer: Medicare Other | Admitting: Physical Therapy

## 2015-04-11 ENCOUNTER — Encounter: Payer: Self-pay | Admitting: Internal Medicine

## 2015-04-11 VITALS — BP 162/58 | HR 84 | Temp 97.7°F | Resp 18 | Ht 72.0 in | Wt 265.0 lb

## 2015-04-11 DIAGNOSIS — N184 Chronic kidney disease, stage 4 (severe): Secondary | ICD-10-CM | POA: Diagnosis not present

## 2015-04-11 DIAGNOSIS — M545 Low back pain, unspecified: Secondary | ICD-10-CM

## 2015-04-11 DIAGNOSIS — R269 Unspecified abnormalities of gait and mobility: Secondary | ICD-10-CM

## 2015-04-11 DIAGNOSIS — D631 Anemia in chronic kidney disease: Secondary | ICD-10-CM

## 2015-04-11 DIAGNOSIS — R5381 Other malaise: Secondary | ICD-10-CM

## 2015-04-11 DIAGNOSIS — F411 Generalized anxiety disorder: Secondary | ICD-10-CM

## 2015-04-11 DIAGNOSIS — I351 Nonrheumatic aortic (valve) insufficiency: Secondary | ICD-10-CM

## 2015-04-11 DIAGNOSIS — R262 Difficulty in walking, not elsewhere classified: Secondary | ICD-10-CM | POA: Diagnosis not present

## 2015-04-11 DIAGNOSIS — N189 Chronic kidney disease, unspecified: Secondary | ICD-10-CM

## 2015-04-11 DIAGNOSIS — E785 Hyperlipidemia, unspecified: Secondary | ICD-10-CM

## 2015-04-11 DIAGNOSIS — E1129 Type 2 diabetes mellitus with other diabetic kidney complication: Secondary | ICD-10-CM

## 2015-04-11 MED ORDER — ROSUVASTATIN CALCIUM 20 MG PO TABS: 20.0000 mg | ORAL_TABLET | Freq: Every day | ORAL | Status: DC

## 2015-04-11 NOTE — Progress Notes (Signed)
Pre visit review using our clinic review tool, if applicable. No additional management support is needed unless otherwise documented below in the visit note. 

## 2015-04-11 NOTE — Patient Instructions (Signed)
We have sent in the refills of the cholesterol medicine and will review the records.   We will follow up with the echo of the heart to see if you need the heart surgery or not.

## 2015-04-11 NOTE — Therapy (Signed)
[facility] [address] [place], [place], [address] Phone: [phone]   Fax:  [phone]  Physical Therapy Treatment  Patient Details  Name: [name] [name] MRN: [id number] Date of Birth: Jul 20, 1966 Referring Provider:  Kathrynn Ducking, MD  Encounter Date: 04/11/2015      PT End of Session - 04/11/15 1102    Visit Number 10   PT Start Time 1024   PT Stop Time 1114   PT Time Calculation (min) 50 min   Activity Tolerance Patient limited by fatigue;Patient tolerated treatment well   Behavior During Therapy [facility] for tasks assessed/performed      Past Medical History  Diagnosis Date  . Diabetes mellitus   . Hypertension   . Leg pain 02/05/11    with walking  . Weight gain   . Shortness of breath     when lying flat  . Diminished eyesight   . Hearing difficulty   . Chronic kidney disease     Dia;ysis MWF  . Anemia   . Nervousness(799.21)   . COPD (chronic obstructive pulmonary disease)   . CHF (congestive heart failure)   . Posttraumatic stress disorder   . Allergic rhinitis   . Dyslipidemia   . Morbid obesity   . Diabetic peripheral neuropathy   . Gait disorder   . Obstructive sleep apnea   . Bilateral carpal tunnel syndrome   . Anxiety disorder   . Gastroparesis   . Gout   . Diabetic retinopathy   . Renal transplant, status post 04/05/2014  . Incomplete bladder emptying   . Urethral stricture     Past Surgical History  Procedure Laterality Date  . Kidney transplant  2009  . Cataract extraction w/ intraocular lens  implant, bilateral Bilateral   . Av fistula placement Bilateral   . Arteriovenous graft placement Bilateral "several"  . Cystoscopy w/ internal urethrotomy      There were no vitals filed for this visit.  Visit Diagnosis:  Bilateral low back pain without sciatica  Difficulty walking  Debility  Abnormality of gait      Subjective Assessment - 04/11/15 1028    Subjective  Pt fell out of  WC in hallway before entering PT clinic, no injuries sustained.  Pt reports that he feels fine but the new medication makes him dizzy.    Pertinent History ESRD, kidney transplant 2009, CVA during the surgery ? according to him   Currently in Pain? Yes   Pain Score 4    Pain Location Shoulder   Pain Orientation Left   Pain Descriptors / Indicators Cramping   Pain Type Chronic pain   Pain Frequency Occasional                         [facility] Adult PT Treatment/Exercise - 04/11/15 0001    Knee/Hip Exercises: Seated   Long Arc Quad Both;2 sets;15 reps;Weights   Long Arc Quad Weight 6 lbs.   Other Seated Knee/Hip Exercises marching 4# 2x15, ball squeeze 2x15 3 sec hold; hip abdct blue tband 2x15   Modalities   Modalities Electrical Stimulation   Moist Heat Therapy   Number Minutes Moist Heat 15 Minutes   Electrical Stimulation   Electrical Stimulation Location L/S area   Electrical Stimulation Action IFC   Electrical Stimulation Parameters to tolerance    Electrical Stimulation Goals Pain  PT Short Term Goals - 04/08/15 1059    PT SHORT TERM GOAL #1   Title independent with initial HEP   Status Achieved           PT Long Term Goals - 04/08/15 1100    PT LONG TERM GOAL #2   Title decrease pain 50% when walking   Status On-going               Plan - 04/11/15 1103    Clinical Impression Statement Pt fell out of manual WC before entering clinic and required mod assist to perform floor to [facility] transfer. Pt no injuries sustained from fall. Pt reports that his new medication makes him dizzy an lightheaded. Due to pt fall a more conservative treatment performed to pt tolerance.    Pt will benefit from skilled therapeutic intervention in order to improve on the following deficits Abnormal gait;Cardiopulmonary status limiting activity;Decreased activity tolerance;Decreased balance;Decreased mobility;Decreased  endurance;Decreased range of motion;Decreased strength;Difficulty walking;Pain   PT Duration 8 weeks   PT Treatment/Interventions Electrical Stimulation;Cryotherapy;Moist Heat;Ultrasound;Gait training;Functional mobility training;Therapeutic activities;Therapeutic exercise;Balance training;Neuromuscular re-education;Manual techniques;Patient/family education   PT Next Visit Plan PT will see for next treatment.         Problem List Patient Active Problem List   Diagnosis Date Noted  . Back pain   . Urine retention   . Back pain at L4-L5 level   . Weakness of both legs   . AKI (acute kidney injury)   . Anemia due to chronic kidney disease   . Low back pain   . Cervical radiculopathy   . Acute kidney injury 10/24/2014  . Anemia 10/24/2014  . Diabetic peripheral neuropathy 10/16/2014  . Renal transplant, status post 04/05/2014  . OSA (obstructive sleep apnea) 04/05/2014  . Severe obesity (BMI >= 40) 04/05/2014  . Benign fibroma of prostate 02/05/2014  . Abdominal apron 04/08/2012  . Chronic kidney failure 03/28/2012  . Benign essential HTN 03/24/2012  . H/O kidney transplant 03/24/2012  . Diabetic macular edema 01/15/2012  . Diabetes mellitus, type 2 01/15/2012  . Diabetic proliferative retinopathy 01/15/2012  . HYPERTENSION, HEART CONTROLLED W/O ASSOC CHF 06/27/2010  . CHEST PAIN 06/03/2010  . IMMUNOCOMPROMISED 01/30/2010  . RECTAL PAIN 01/30/2010  . NAUSEA AND VOMITING 01/30/2010  . DIARRHEA 01/30/2010  . KIDNEY TRANSPLANTATION, HX OF 01/30/2010  . IDDM 01/27/2010  . HYPERPARATHYROIDISM, SECONDARY 01/27/2010  . GOUT 01/27/2010  . Anxiety state 01/27/2010  . GASTROPARESIS 01/27/2010  . END STAGE RENAL DISEASE 01/27/2010  . FROZEN LEFT SHOULDER 01/27/2010  . INSOMNIA 01/27/2010  . DIABETIC  RETINOPATHY 05/25/2008  . HLD (hyperlipidemia) 05/25/2008  . ERECTILE DYSFUNCTION 05/25/2008  . OBSTRUCTIVE SLEEP APNEA 05/25/2008  . Essential hypertension 05/25/2008    Scot Jun, PTA 04/11/2015, 11:09 AM  [place], [place], [address] Phone: [phone]   Fax:  [phone]

## 2015-04-12 ENCOUNTER — Encounter: Payer: Self-pay | Admitting: Internal Medicine

## 2015-04-12 DIAGNOSIS — I351 Nonrheumatic aortic (valve) insufficiency: Secondary | ICD-10-CM | POA: Insufficient documentation

## 2015-04-12 NOTE — Assessment & Plan Note (Signed)
Reviewed records and would appear his creatinine has stabilized around 2 for now. He has already had ESRD and kidney transplant in 2009. He thinks that they removed his access and they are not useable now. BP needs to be controlled and he claims that it is and his nephrologist handles his BP medicines. He is going to take an extra clonidine today when he gets home.

## 2015-04-12 NOTE — Assessment & Plan Note (Signed)
He does have many medicines including lyrica for this pain. He denies any change in the pain and is able to manage. He does not stand or walk at this time (and has not for many years). Due to his erratic behavior while driving at our office today would be hesitant to give any more sedating medications to him since he is always "driving" and needs his focus. Talked to him about physical therapy and he thinks that he has done that before. Likely his increase in weight is not helping the pain and may be causing some of his pain.

## 2015-04-12 NOTE — Assessment & Plan Note (Signed)
Noted to be severe when in the hospital with back infection. Recently saw cardiology and they are going to recheck an echo (has not been done yet) for second opinion of if he needs surgery. No SOB although he does not exert himself and does not walk. No chest pains. Hard to tell if he has swelling in his legs versus chronic stasis from the dangling while in his scooter.

## 2015-04-12 NOTE — Assessment & Plan Note (Signed)
Review of last LDL 55 from 1-2 years ago and he is likely due for recheck soon. He did not wish to do blood work today since he gets so much at other places.

## 2015-04-12 NOTE — Assessment & Plan Note (Signed)
He has seen endocrinology recently and have asked him to continue seeing them. Did not bring any log of sugars to today's visit and is on very high insulin doses.

## 2015-04-12 NOTE — Progress Notes (Signed)
   Subjective:    Patient ID: [name] [name], male    DOB: 08-04-1966, 49 y.o.   MRN: [id number]  HPI The patient is a scooter bound 49 YO man who is coming in with shoulder and back pain. He has extensive PMH (see A/P for details and treatment). The back pain has been going on for a long time and he was found to have infection in the spine. Since that time and treatment he is still struggling with the pain. It has not changed recently. No new bowel or bladder changes. He does struggle with some numbness in his legs for many years but that has not changed. Since his hospitalization for the back pain and infection he has gained a lot of weight. He is also concerned since they told him in the hospital that he would need open heart surgery and he just doesn't believe that since he feels great overall.   PMH, [facility], social history reviewed and updated.   Review of Systems  Constitutional: Positive for fatigue and unexpected weight change. Negative for fever, chills, activity change and appetite change.  HENT: Negative.   Eyes: Negative.   Respiratory: Negative for cough, chest tightness, shortness of breath and wheezing.   Cardiovascular: Positive for leg swelling. Negative for chest pain and palpitations.  Gastrointestinal: Negative for nausea, abdominal pain, diarrhea, constipation and abdominal distention.  Genitourinary: Negative.   Musculoskeletal: Positive for myalgias, back pain, arthralgias and gait problem.  Neurological: Positive for weakness and numbness. Negative for dizziness, syncope, light-headedness and headaches.  Psychiatric/Behavioral: Negative.       Objective:   Physical Exam  Constitutional: He is oriented to person, place, and time. He appears well-developed and well-nourished.  Morbidly obese, sitting on scooter which he drives somewhat erratically.   HENT:  Head: Normocephalic and atraumatic.  Eyes: EOM are normal.  Neck: Normal range of motion.  Cardiovascular: Normal  rate.   Pulmonary/Chest: Effort normal and breath sounds normal.  Abdominal: Soft.  Musculoskeletal: He exhibits no edema.  Hard to tell if there is edema in his feet which are very large due to chronic hanging while he sits on the scooter  Neurological: He is alert and oriented to person, place, and time. Coordination abnormal.  While attempting to go to the bathroom he maneuvers into a wall a couple times.   Skin: Skin is warm and dry.  Psychiatric: He has a normal mood and affect.   Filed Vitals:   04/11/15 1422  BP: 162/58  Pulse: 84  Temp: 97.7 F (36.5 C)  TempSrc: Oral  Resp: 18  Height: 6' (1.829 m)  Weight: 265 lb (120.203 kg)  SpO2: 97%      Assessment & Plan:

## 2015-04-12 NOTE — Assessment & Plan Note (Signed)
Reviewing records from [place] obtained recent results which show stability of his blood counts. I do not believe he is on aranesp or similar.

## 2015-04-12 NOTE — Assessment & Plan Note (Signed)
He denies this diagnosis with the exception of his fear of closed spaces during imaging studies.

## 2015-04-16 ENCOUNTER — Encounter: Payer: Self-pay | Admitting: Nutrition

## 2015-04-16 ENCOUNTER — Ambulatory Visit: Payer: Medicare Other | Attending: Neurology | Admitting: Physical Therapy

## 2015-04-16 ENCOUNTER — Other Ambulatory Visit: Payer: Self-pay

## 2015-04-16 DIAGNOSIS — R262 Difficulty in walking, not elsewhere classified: Secondary | ICD-10-CM | POA: Insufficient documentation

## 2015-04-16 DIAGNOSIS — R5381 Other malaise: Secondary | ICD-10-CM | POA: Insufficient documentation

## 2015-04-16 DIAGNOSIS — M545 Low back pain: Secondary | ICD-10-CM | POA: Insufficient documentation

## 2015-04-16 DIAGNOSIS — M25511 Pain in right shoulder: Secondary | ICD-10-CM | POA: Insufficient documentation

## 2015-04-16 DIAGNOSIS — R269 Unspecified abnormalities of gait and mobility: Secondary | ICD-10-CM | POA: Insufficient documentation

## 2015-04-17 ENCOUNTER — Encounter: Payer: Self-pay | Admitting: Physician Assistant

## 2015-04-17 ENCOUNTER — Telehealth: Payer: Self-pay | Admitting: Endocrinology

## 2015-04-17 ENCOUNTER — Other Ambulatory Visit (INDEPENDENT_AMBULATORY_CARE_PROVIDER_SITE_OTHER): Payer: Medicare Other

## 2015-04-17 ENCOUNTER — Ambulatory Visit (HOSPITAL_COMMUNITY): Payer: Medicare Other | Attending: Cardiovascular Disease

## 2015-04-17 ENCOUNTER — Encounter: Payer: Self-pay | Admitting: *Deleted

## 2015-04-17 ENCOUNTER — Other Ambulatory Visit: Payer: Self-pay

## 2015-04-17 DIAGNOSIS — E1129 Type 2 diabetes mellitus with other diabetic kidney complication: Secondary | ICD-10-CM | POA: Diagnosis not present

## 2015-04-17 DIAGNOSIS — I10 Essential (primary) hypertension: Secondary | ICD-10-CM | POA: Insufficient documentation

## 2015-04-17 DIAGNOSIS — I351 Nonrheumatic aortic (valve) insufficiency: Secondary | ICD-10-CM | POA: Diagnosis not present

## 2015-04-17 DIAGNOSIS — I517 Cardiomegaly: Secondary | ICD-10-CM | POA: Insufficient documentation

## 2015-04-17 DIAGNOSIS — E119 Type 2 diabetes mellitus without complications: Secondary | ICD-10-CM | POA: Diagnosis not present

## 2015-04-17 DIAGNOSIS — E785 Hyperlipidemia, unspecified: Secondary | ICD-10-CM | POA: Insufficient documentation

## 2015-04-17 LAB — HEMOGLOBIN A1C: Hgb A1c MFr Bld: 5.5 % (ref 4.6–6.5)

## 2015-04-17 NOTE — Telephone Encounter (Signed)
Kim from [facility] calling to discuss the insulin dosage on the most recent rx # [phone]

## 2015-04-18 ENCOUNTER — Encounter: Payer: Self-pay | Admitting: Physical Therapy

## 2015-04-18 ENCOUNTER — Ambulatory Visit: Payer: Medicare Other | Admitting: Physical Therapy

## 2015-04-18 DIAGNOSIS — M25511 Pain in right shoulder: Secondary | ICD-10-CM | POA: Diagnosis not present

## 2015-04-18 DIAGNOSIS — R5381 Other malaise: Secondary | ICD-10-CM

## 2015-04-18 DIAGNOSIS — R269 Unspecified abnormalities of gait and mobility: Secondary | ICD-10-CM

## 2015-04-18 DIAGNOSIS — R262 Difficulty in walking, not elsewhere classified: Secondary | ICD-10-CM | POA: Diagnosis not present

## 2015-04-18 DIAGNOSIS — M545 Low back pain, unspecified: Secondary | ICD-10-CM

## 2015-04-18 NOTE — Therapy (Signed)
[place], [place], [address] Phone: [phone]   Fax:  [phone]  Physical Therapy Treatment  Patient Details  Name: [name] [name] MRN: [id number] Date of Birth: 1966-02-09 Referring Provider:  Kathrynn Ducking, MD  Encounter Date: 04/18/2015      PT End of Session - 04/18/15 1115    Visit Number 11   PT Start Time 3220   PT Stop Time 1102   PT Time Calculation (min) 47 min   Activity Tolerance Patient limited by fatigue;Patient tolerated treatment well   Behavior During Therapy [facility] for tasks assessed/performed      Past Medical History  Diagnosis Date  . Diabetes mellitus   . Hypertension   . Leg pain 02/05/11    with walking  . Weight gain   . Shortness of breath     when lying flat  . Diminished eyesight   . Hearing difficulty   . Chronic kidney disease     Dia;ysis MWF  . Anemia   . Nervousness(799.21)   . COPD (chronic obstructive pulmonary disease)   . CHF (congestive heart failure)   . Posttraumatic stress disorder   . Allergic rhinitis   . Dyslipidemia   . Morbid obesity   . Diabetic peripheral neuropathy   . Gait disorder   . Obstructive sleep apnea   . Bilateral carpal tunnel syndrome   . Anxiety disorder   . Gastroparesis   . Gout   . Diabetic retinopathy   . Renal transplant, status post 04/05/2014  . Incomplete bladder emptying   . Urethral stricture   . Aortic insufficiency     a. Echo 7/16:  Mod LVH, EF 50-55%, mild to mod AI, severe LAE, PASP 57 mmHg (LV ID end diastolic 25.4 mm)    Past Surgical History  Procedure Laterality Date  . Kidney transplant  2009  . Cataract extraction w/ intraocular lens  implant, bilateral Bilateral   . Av fistula placement Bilateral   . Arteriovenous graft placement Bilateral "several"  . Cystoscopy w/ internal urethrotomy      There were no vitals filed for this visit.  Visit Diagnosis:  Difficulty  walking  Bilateral low back pain without sciatica  Debility  Abnormality of gait      Subjective Assessment - 04/18/15 1015    Subjective Pt reports that he stopped taking his medication because it made him feel high for two days. Pt does report less back pain and no falls.   Pertinent History ESRD, kidney transplant 2009, CVA during the surgery ? according to him   How long can you sit comfortably? in chair 15 min, on the floor for a long time    How long can you stand comfortably? <5 min   How long can you walk comfortably? with RW <5 min   Diagnostic tests MRI negative   Patient Stated Goals got get balance and mobility back. To lose weight   Currently in Pain? No/denies   Pain Score 0-No pain   Pain Location Back   Pain Orientation Left   Pain Type Chronic pain   Pain Onset More than a month ago                         [facility] Adult PT Treatment/Exercise - 04/18/15 0001    Ambulation/Gait   Ambulation/Gait Yes   Ambulation/Gait Assistance 6: Modified independent (Device/Increase time)   Ambulation Distance (  Feet) 100 Feet   Assistive device Rolling walker   Gait Pattern Wide base of support;Poor foot clearance - left;Trunk flexed   Ambulation Surface Level;Indoor   Gait Comments 3 times to increase stamina   Knee/Hip Exercises: Aerobic   Isokinetic NuStep level 5  x10 min    Knee/Hip Exercises: Standing   Other Standing Knee Exercises sit to stand X 5; Sit to stand on airex X5   Pt very hesitant with airex   Knee/Hip Exercises: Seated   Long Arc Quad Both;2 sets;15 reps;Weights   Long Arc Quad Weight 6 lbs.   Other Seated Knee/Hip Exercises marching 6# 2x15, ball squeeze 2x10 3 sec hold; hip abdct blue tband 2x15   Other Seated Knee/Hip Exercises hamstring curls blue tband 2 x 15                  PT Short Term Goals - 04/08/15 1059    PT SHORT TERM GOAL #1   Title independent with initial HEP   Status Achieved           PT Long  Term Goals - 04/18/15 1117    PT LONG TERM GOAL #2   Title decrease pain 50% when walking   Status Partially Met   PT LONG TERM GOAL #3   Title sit to stand at all times independently   Status Partially Met   PT LONG TERM GOAL #4   Title walk with walker 300 feet   Status Partially Met               Plan - 04/18/15 1115    Clinical Impression Statement Pt reports that his LB is improving, Pt able to tolerated therapy session that consisted of increased interventions. Pt still requires multiple rest breaks during session.    Pt will benefit from skilled therapeutic intervention in order to improve on the following deficits Abnormal gait;Cardiopulmonary status limiting activity;Decreased activity tolerance;Decreased balance;Decreased mobility;Decreased endurance;Decreased range of motion;Decreased strength;Difficulty walking;Pain   Rehab Potential Good   PT Frequency 2x / week   PT Duration 8 weeks   PT Treatment/Interventions Gait training;Functional mobility training;Therapeutic activities;Therapeutic exercise;Balance training;Neuromuscular re-education;Manual techniques;Patient/family education   PT Next Visit Plan Progress with balance activities         Problem List Patient Active Problem List   Diagnosis Date Noted  . Aortic insufficiency 04/12/2015  . Back pain   . Urine retention   . Back pain at L4-L5 level   . Weakness of both legs   . Anemia due to chronic kidney disease   . Low back pain   . Cervical radiculopathy   . Diabetic peripheral neuropathy 10/16/2014  . Renal transplant, status post 04/05/2014  . OSA (obstructive sleep apnea) 04/05/2014  . Severe obesity (BMI >= 40) 04/05/2014  . Benign fibroma of prostate 02/05/2014  . Abdominal apron 04/08/2012  . Chronic kidney failure 03/28/2012  . Benign essential HTN 03/24/2012  . H/O kidney transplant 03/24/2012  . Diabetic macular edema 01/15/2012  . Diabetes mellitus, type 2 01/15/2012  . Diabetic  proliferative retinopathy 01/15/2012  . CHEST PAIN 06/03/2010  . IMMUNOCOMPROMISED 01/30/2010  . RECTAL PAIN 01/30/2010  . NAUSEA AND VOMITING 01/30/2010  . KIDNEY TRANSPLANTATION, HX OF 01/30/2010  . HYPERPARATHYROIDISM, SECONDARY 01/27/2010  . GOUT 01/27/2010  . Anxiety state 01/27/2010  . GASTROPARESIS 01/27/2010  . END STAGE RENAL DISEASE 01/27/2010  . FROZEN LEFT SHOULDER 01/27/2010  . INSOMNIA 01/27/2010  . HLD (hyperlipidemia) 05/25/2008  . OBSTRUCTIVE  SLEEP APNEA 05/25/2008  . Essential hypertension 05/25/2008    Scot Jun, PTA 04/18/2015, 11:21 AM  [place], [place], [address] Phone: [phone]   Fax:  [phone]

## 2015-04-19 ENCOUNTER — Telehealth: Payer: Self-pay | Admitting: *Deleted

## 2015-04-19 DIAGNOSIS — I351 Nonrheumatic aortic (valve) insufficiency: Secondary | ICD-10-CM

## 2015-04-19 NOTE — Telephone Encounter (Signed)
Pt notified of echo results by phone with verbal understanding today and is agreeable to plan of care to repeat echo in 6 months.

## 2015-04-22 ENCOUNTER — Encounter: Payer: Self-pay | Admitting: Adult Health

## 2015-04-22 ENCOUNTER — Ambulatory Visit (INDEPENDENT_AMBULATORY_CARE_PROVIDER_SITE_OTHER): Payer: Medicare Other | Admitting: Adult Health

## 2015-04-22 VITALS — BP 151/68 | HR 78 | Ht 72.0 in | Wt 261.0 lb

## 2015-04-22 DIAGNOSIS — E1142 Type 2 diabetes mellitus with diabetic polyneuropathy: Secondary | ICD-10-CM

## 2015-04-22 DIAGNOSIS — R269 Unspecified abnormalities of gait and mobility: Secondary | ICD-10-CM | POA: Diagnosis not present

## 2015-04-22 MED ORDER — PREGABALIN 50 MG PO CAPS: 50.0000 mg | ORAL_CAPSULE | Freq: Every day | ORAL | Status: DC

## 2015-04-22 NOTE — Patient Instructions (Signed)
Begin Lyrica again. If you have falls or dizziness let us know.  If your symptoms worsen or you develop new symptoms please let us know.

## 2015-04-22 NOTE — Progress Notes (Signed)
I have read the note, and I agree with the clinical assessment and plan.  Arjay Jaskiewicz KEITH   

## 2015-04-22 NOTE — Progress Notes (Signed)
PATIENT: [name] [name] DOB: 1966-08-25  REASON FOR VISIT: follow up- diabetic neuropathy HISTORY FROM: patient  HISTORY OF PRESENT ILLNESS: Mr. Greear, is a 49 year old male with a history of diabetic peripheral neuropathy, obesity and abnormality of gait. He returns today for an evaluation. At the last visit he was started on Lyrica 50 mg daily. He reports that it was helpful. However the first 2 days he took it made him feel "different" but after that he return to his baseline. He has not been taking it recently because when he was in the nursing home they did not prescribe it. The patient currently has burning tingling pain in the upper lateral part of the thighs. He states that is very painful to bathe that area. He continues to use a motorized wheelchair. He states that he was in the hospital in January due to an acute kidney injury he was then sent to the rehabilitation at [facility]. The patient has continued doing rehabilitation twice a week. The patient continues to have right shoulder pain. He states that his doctor wants him to have another injection but he is not sure if he wants to have this done. He returns today for an evaluation.  HISTORY 10/16/14 (WILLIS): Mr. Neenan is a 49 year old right-handed gentleman with a history of obesity and diabetes. The patient has a peripheral neuropathy as a complication of the diabetes. He was on gabapentin which  was helpful for his peripheral neuropathy discomfort, but he stopped the medication as it was causing urinary incontinence in the evening hours. He has fallen on one occasion since last seen in September 2015. The patient generally uses a power wheelchair inside the house and a scooter outside the house. He has migratory pain that may be in the hands, arms, thighs, or feet. He has a footdrop on the left. He has been told that his arms or legs may jerk at nighttime. Currently, he is not on any medications for his peripheral neuropathy discomfort. He returns for an evaluation. He indicates that in the past he has been on Cymbalta, but it is not clear that this medication helped him.   REVIEW OF SYSTEMS: Out of a complete 14 system review of symptoms, the patient complains only of the following symptoms, and all other reviewed systems are negative.  Joint pain, back pain, aching muscles, walking difficulty, nervous/anxious  ALLERGIES: Allergies  Allergen Reactions  . Penicillins Rash and Other (See Comments)    fever  . Pork-Derived Products Anaphylaxis and Other (See Comments)    Fever   . Ace Inhibitors Cough    HOME MEDICATIONS: Outpatient Prescriptions Prior to Visit  Medication Sig Dispense Refill  . ACCU-CHEK FASTCLIX LANCETS MISC Use to check blood sugar 4 times per day dx code E11.9 408 each 1  . aspirin 325 MG tablet Take 325 mg by mouth daily.     Marland Kitchen aspirin 81 MG tablet Take 81 mg by mouth daily.    . calcitRIOL (ROCALTROL) 0.5 MCG capsule Take 0.5 mcg by mouth at bedtime.     . clonazePAM (KLONOPIN) 0.5 MG tablet Take 0.5 mg by mouth 3 (three) times daily as needed for anxiety.     . Febuxostat (ULORIC) 80 MG TABS Take 80 mg by mouth daily.    . furosemide (LASIX) 80 MG tablet Take 2 tablets by mouth 2 (two) times daily.    Marland Kitchen glucose blood (ACCU-CHEK SMARTVIEW) test strip Use as instructed to check blood sugar 4 times per day dx code E11.9 400 each 1  . insulin aspart (NOVOLOG) 100  UNIT/ML injection Inject 0-15 Units into the skin 3 (three) times daily with meals. CBG < 70: implement hypoglycemia protocol CBG 70 - 120: 0 units CBG 121 - 150: 2 units CBG 151 - 200: 3 units CBG 201 - 250: 5 units CBG 251 - 300: 8 units CBG 301 - 350: 11 units CBG 351 - 400: 15 units CBG > 400: call MD 10 mL 0  . Insulin Glargine (TOUJEO SOLOSTAR) 300 UNIT/ML SOPN Inject 90 Units into the skin 2 (two) times daily. 30 mL 1  . Insulin Pen Needle (BD PEN NEEDLE NANO U/F) 32G X 4 MM MISC Use 5 per day 450 each 1  . Insulin Regular Human, Conc, (HUMULIN R U-500 KWIKPEN) 500 UNIT/ML SOPN Inject 10 Units into the skin once. U-500 insulin 10 before bfst, 25 before lunch and 20 before dinner, take 30 min before Sliding scale use for high sugars:  Sugar 150-199 take 2 units, 200-250= 4 units and over 250 take 6 units 3 pen 1  . mycophenolate (CELLCEPT) 250 MG capsule Take 750 mg by mouth 2 (two) times daily.     Marland Kitchen omeprazole (PRILOSEC)  20 MG capsule Take 20 mg by mouth daily.    . potassium chloride SA (K-DUR,KLOR-CON) 20 MEQ tablet Take 2 tablets by mouth 2 (two) times daily.    . predniSONE (DELTASONE) 5 MG tablet Take 5 mg by mouth daily.      . pregabalin (LYRICA) 50 MG capsule Take 50 mg by mouth daily.    . rosuvastatin (CRESTOR) 20 MG tablet Take 1 tablet (20 mg total) by mouth at bedtime. 90 tablet 3  . tacrolimus (PROGRAF) 0.5 MG capsule Take 1 mg by mouth 2 (two) times daily.      No facility-administered medications prior to visit.    PAST MEDICAL HISTORY: Past Medical History  Diagnosis Date  . Diabetes mellitus   . Hypertension   . Leg pain 02/05/11    with walking  . Weight gain   . Shortness of breath     when lying flat  . Diminished eyesight   . Hearing difficulty   . Chronic kidney disease     Dia;ysis MWF  . Anemia   . Nervousness(799.21)   . COPD (chronic obstructive pulmonary disease)   . CHF (congestive heart failure)   . Posttraumatic stress disorder   .  Allergic rhinitis   . Dyslipidemia   . Morbid obesity   . Diabetic peripheral neuropathy   . Gait disorder   . Obstructive sleep apnea   . Bilateral carpal tunnel syndrome   . Anxiety disorder   . Gastroparesis   . Gout   . Diabetic retinopathy   . Renal transplant, status post 04/05/2014  . Incomplete bladder emptying   . Urethral stricture   . Aortic insufficiency     a. Echo 7/16:  Mod LVH, EF 50-55%, mild to mod AI, severe LAE, PASP 57 mmHg (LV ID end diastolic [id number] mm)    PAST SURGICAL HISTORY: Past Surgical History  Procedure Laterality Date  . Kidney transplant  2009  . Cataract extraction w/ intraocular lens  implant, bilateral Bilateral   . Av fistula placement Bilateral   . Arteriovenous graft placement Bilateral "several"  . Cystoscopy w/ internal urethrotomy      FAMILY HISTORY: Family History  Problem Relation Age of Onset  . Hypertension Mother   . Diabetes Father     PHYSICAL EXAM  Filed Vitals:   04/22/15 1311  BP: 151/68  Pulse: 78  Height: 6' (1.829 m)  Weight: 261 lb (118.389 kg)   Body mass index is 35.39 kg/(m^2).  Generalized: Well developed, in no acute distress   Neurological examination  Mentation: Alert oriented to time, place, history taking. Follows all commands speech and language fluent Cranial nerve II-XII: Pupils were equal round reactive to light. Extraocular movements were full, visual field were full on confrontational test. Facial sensation and strength were normal. Uvula tongue midline. Head turning and shoulder shrug  were normal and symmetric. Motor: The motor testing reveals 5 over 5 strength of all 4 extremities. Good symmetric motor tone is noted throughout.  Sensory: Sensory testing is intact to soft touch on all 4 extremities. No evidence of extinction is noted.  Coordination: Cerebellar testing reveals good finger-nose-finger and heel-to-shin bilaterally.  Gait and station: Patient in a motorized wheelchair.  Reflexes:  Deep tendon reflexes are symmetric and normal bilaterally.   DIAGNOSTIC DATA (LABS, IMAGING, TESTING) - I reviewed patient records, labs, notes, testing and imaging myself where available.  Lab Results  Component Value Date   WBC 6.3 11/22/2014   HGB 11.9*  11/22/2014   HCT 35.0* 11/22/2014   MCV 82.0 11/22/2014   PLT 281 11/22/2014      Component Value Date/Time   NA 139 11/22/2014 1219   K 4.6 11/22/2014 1219   CL 100 11/22/2014 1219   CO2 29 11/22/2014 1208   GLUCOSE 200* 11/22/2014 1219   BUN 41* 11/22/2014 1219   CREATININE 1.90* 11/22/2014 1219   CALCIUM 8.7 11/22/2014 1208   PROT 6.3 10/24/2014 1045   ALBUMIN 2.7* 10/25/2014 0430   AST 15 10/24/2014 1045   ALT 12 10/24/2014 1045   ALKPHOS 88 10/24/2014 1045   BILITOT 1.1 10/24/2014 1045   GFRNONAA 35* 11/22/2014 1208   GFRAA 41* 11/22/2014 1208   Lab Results  Component Value Date   CHOL  12/12/2007    121        ATP III CLASSIFICATION:  <200     mg/dL   Desirable  200-239  mg/dL   Borderline High  >=240    mg/dL   High   HDL 31* 12/12/2007   LDLCALC  12/12/2007    55        Total Cholesterol/HDL:CHD Risk Coronary Heart Disease Risk Table                     Men   Women  1/2 Average Risk   3.4   3.3   TRIG 175* 12/12/2007   CHOLHDL 3.9 12/12/2007   Lab Results  Component Value Date   HGBA1C 5.5 04/17/2015   Lab Results  Component Value Date   VITAMINB12 236 10/24/2014   No results found for: TSH    ASSESSMENT AND PLAN 49 y.o. year old male  has a past medical history of Diabetes mellitus; Hypertension; Leg pain (02/05/11); Weight gain; Shortness of breath; Diminished eyesight; Hearing difficulty; Chronic kidney disease; Anemia; Nervousness(799.21); COPD (chronic obstructive pulmonary disease); CHF (congestive heart failure); Posttraumatic stress disorder; Allergic rhinitis; Dyslipidemia; Morbid obesity; Diabetic peripheral neuropathy; Gait disorder; Obstructive sleep apnea; Bilateral carpal tunnel  syndrome; Anxiety disorder; Gastroparesis; Gout; Diabetic retinopathy; Renal transplant, status post (04/05/2014); Incomplete bladder emptying; Urethral stricture; and Aortic insufficiency. here with:  1. Diabetic neuropathy 2. Abnormality of gait  The patient will resume taking Lyrica 50 mg at bedtime. I have reviewed the side effects of Lyrica. I explained to the patient that if he is unable to tolerate Lyrica he should let us know. Patient has continued with physical therapy twice a week with good benefit. Patient advised that if his symptoms worsen or he develops new symptoms he should let us know. He will follow-up in 6 months or sooner if needed.  Ward Givens, MSN, NP-C 04/22/2015, 1:31 PM [facility] [address] [place], [place] [address] [phone]  Note: This document was prepared with digital dictation and possible smart phrase technology. Any transcriptional errors that result from this process are unintentional.

## 2015-04-23 ENCOUNTER — Encounter: Payer: Self-pay | Admitting: Physical Therapy

## 2015-04-23 ENCOUNTER — Ambulatory Visit: Payer: Medicare Other | Admitting: Physical Therapy

## 2015-04-23 DIAGNOSIS — R5381 Other malaise: Secondary | ICD-10-CM | POA: Diagnosis not present

## 2015-04-23 DIAGNOSIS — R269 Unspecified abnormalities of gait and mobility: Secondary | ICD-10-CM | POA: Diagnosis not present

## 2015-04-23 DIAGNOSIS — M545 Low back pain, unspecified: Secondary | ICD-10-CM

## 2015-04-23 DIAGNOSIS — R262 Difficulty in walking, not elsewhere classified: Secondary | ICD-10-CM | POA: Diagnosis not present

## 2015-04-23 DIAGNOSIS — M25511 Pain in right shoulder: Secondary | ICD-10-CM | POA: Diagnosis not present

## 2015-04-23 NOTE — Therapy (Signed)
[place], [place], [address] Phone: [phone]   Fax:  [phone]  Physical Therapy Treatment  Patient Details  Name: [name] [name] MRN: [id number] Date of Birth: Nov 06, 1965 Referring Provider:  Kathrynn Ducking, MD  Encounter Date: 04/23/2015      PT End of Session - 04/23/15 1043    Visit Number 12   Date for PT Re-Evaluation 05/06/15   PT Start Time 1000   PT Stop Time 1043   PT Time Calculation (min) 43 min   Activity Tolerance Patient limited by fatigue;Patient tolerated treatment well   Behavior During Therapy Flat affect      Past Medical History  Diagnosis Date  . Diabetes mellitus   . Hypertension   . Leg pain 02/05/11    with walking  . Weight gain   . Shortness of breath     when lying flat  . Diminished eyesight   . Hearing difficulty   . Chronic kidney disease     Dia;ysis MWF  . Anemia   . Nervousness(799.21)   . COPD (chronic obstructive pulmonary disease)   . CHF (congestive heart failure)   . Posttraumatic stress disorder   . Allergic rhinitis   . Dyslipidemia   . Morbid obesity   . Diabetic peripheral neuropathy   . Gait disorder   . Obstructive sleep apnea   . Bilateral carpal tunnel syndrome   . Anxiety disorder   . Gastroparesis   . Gout   . Diabetic retinopathy   . Renal transplant, status post 04/05/2014  . Incomplete bladder emptying   . Urethral stricture   . Aortic insufficiency     a. Echo 7/16:  Mod LVH, EF 50-55%, mild to mod AI, severe LAE, PASP 57 mmHg (LV ID end diastolic Q000111Q mm)    Past Surgical History  Procedure Laterality Date  . Kidney transplant  2009  . Cataract extraction w/ intraocular lens  implant, bilateral Bilateral   . Av fistula placement Bilateral   . Arteriovenous graft placement Bilateral "several"  . Cystoscopy w/ internal urethrotomy      There were no vitals filed for this visit.  Visit Diagnosis:   Difficulty walking  Bilateral low back pain without sciatica  Debility  Abnormality of gait      Subjective Assessment - 04/23/15 1011    Subjective Pt reports that he is getting better, with no new issues    Pertinent History ESRD, kidney transplant 2009, CVA during the surgery ? according to him   How long can you sit comfortably? in chair 15 min, on the floor for a long time    How long can you stand comfortably? <5 min   How long can you walk comfortably? with RW <5 min   Diagnostic tests MRI negative   Patient Stated Goals got get balance and mobility back. To lose weight   Currently in Pain? Yes   Pain Score 4    Pain Location Back   Pain Orientation Lower   Pain Descriptors / Indicators Burning   Pain Type Chronic pain   Pain Onset More than a month ago                         [facility] Adult PT Treatment/Exercise - 04/23/15 0001    Ambulation/Gait   Ambulation/Gait Yes   Ambulation/Gait Assistance 6: Modified independent (Device/Increase time)   Ambulation Distance (Feet) 60  Feet   Assistive device Rolling walker   Gait Pattern Step-through pattern;Left steppage;Trunk flexed   Ambulation Surface Level;Indoor   Gait Comments 3 times to increase stamina   Knee/Hip Exercises: Aerobic   Isokinetic NuStep level 5  x16 min                 PT Education - 04/23/15 1053    Education provided Yes   Education Details Discusses with pt about monitoring blood sugar better with increased activity.   Person(s) Educated Patient   Methods Explanation;Verbal cues   Comprehension Verbalized understanding          PT Short Term Goals - 04/08/15 1059    PT SHORT TERM GOAL #1   Title independent with initial HEP   Status Achieved           PT Long Term Goals - 04/23/15 1049    PT LONG TERM GOAL #1   Title decrease TUG time to 26 seconds   Status On-going               Plan - 04/23/15 1046    Clinical Impression Statement Pt did not  tolerate treatment well. Pt reports feeling dizzy during ambulation and reports that he feels like his blood sugar is dropping. Pt reported that he had an insulin injection at 5 o'clock this morning but has not much to eat. Pt  requested and received a cup  of water with sugar and a candy bar but was unable to recover. PT treatment discontinued due to pt subjective complaints of dizziness and fatigue.   Pt will benefit from skilled therapeutic intervention in order to improve on the following deficits Abnormal gait;Cardiopulmonary status limiting activity;Decreased activity tolerance;Decreased balance;Decreased mobility;Decreased endurance;Decreased range of motion;Decreased strength;Difficulty walking;Pain   Rehab Potential Fair   PT Frequency 2x / week   PT Duration 8 weeks   PT Treatment/Interventions Gait training;Functional mobility training;Therapeutic activities;Therapeutic exercise;Balance training;Neuromuscular re-education;Manual techniques;Patient/family education   PT Next Visit Plan Progress with balance activities         Problem List Patient Active Problem List   Diagnosis Date Noted  . Aortic insufficiency 04/12/2015  . Back pain   . Urine retention   . Back pain at L4-L5 level   . Weakness of both legs   . Anemia due to chronic kidney disease   . Low back pain   . Cervical radiculopathy   . Diabetic peripheral neuropathy 10/16/2014  . Renal transplant, status post 04/05/2014  . OSA (obstructive sleep apnea) 04/05/2014  . Severe obesity (BMI >= 40) 04/05/2014  . Benign fibroma of prostate 02/05/2014  . Abdominal apron 04/08/2012  . Chronic kidney failure 03/28/2012  . Benign essential HTN 03/24/2012  . H/O kidney transplant 03/24/2012  . Diabetic macular edema 01/15/2012  . Diabetes mellitus, type 2 01/15/2012  . Diabetic proliferative retinopathy 01/15/2012  . CHEST PAIN 06/03/2010  . IMMUNOCOMPROMISED 01/30/2010  . RECTAL PAIN 01/30/2010  . NAUSEA AND VOMITING  01/30/2010  . KIDNEY TRANSPLANTATION, HX OF 01/30/2010  . HYPERPARATHYROIDISM, SECONDARY 01/27/2010  . GOUT 01/27/2010  . Anxiety state 01/27/2010  . GASTROPARESIS 01/27/2010  . END STAGE RENAL DISEASE 01/27/2010  . FROZEN LEFT SHOULDER 01/27/2010  . INSOMNIA 01/27/2010  . HLD (hyperlipidemia) 05/25/2008  . OBSTRUCTIVE SLEEP APNEA 05/25/2008  . Essential hypertension 05/25/2008    Scot Jun, PTA 04/23/2015, 10:57 AM  [place], [place], [address]  Phone: [phone]   Fax:  [phone]

## 2015-04-24 ENCOUNTER — Encounter: Payer: Self-pay | Admitting: Endocrinology

## 2015-04-24 ENCOUNTER — Ambulatory Visit (INDEPENDENT_AMBULATORY_CARE_PROVIDER_SITE_OTHER): Payer: Medicare Other | Admitting: Endocrinology

## 2015-04-24 VITALS — BP 158/74 | HR 77 | Temp 98.0°F | Resp 16 | Ht 72.0 in | Wt 265.6 lb

## 2015-04-24 DIAGNOSIS — E785 Hyperlipidemia, unspecified: Secondary | ICD-10-CM

## 2015-04-24 DIAGNOSIS — I10 Essential (primary) hypertension: Secondary | ICD-10-CM

## 2015-04-24 DIAGNOSIS — E1165 Type 2 diabetes mellitus with hyperglycemia: Secondary | ICD-10-CM | POA: Diagnosis not present

## 2015-04-24 DIAGNOSIS — IMO0002 Reserved for concepts with insufficient information to code with codable children: Secondary | ICD-10-CM

## 2015-04-24 NOTE — Progress Notes (Signed)
Patient ID: [name] [name], male   DOB: 02/20/1966, 49 y.o.   MRN: [id number]           Reason for Appointment: Follow-up for Type 2 Diabetes  Referring physician: none  History of Present Illness:          Date of diagnosis of type 2 diabetes mellitus : 1990       Background history:  He has been taking insulin since about 1999, previously was on unknown oral agents He had good control with insulin initially but A1c was much higher in 2008 and subsequently has had required larger doses of insulin He has been on Lantus for the last few years along with Novolog,followed by an endocrinologist since 2008 His A1c was 6.3 in 04/2014  Recent history:   INSULIN regimen is :U-500 insulin 10 before bfst, 25 before lunch and 20 before dinner, take 30 min before  Sliding scale use for high sugars: Sugar 150-199 take 2 units, 200-250= 4 units and over 250 take 6 units  TOUJEO 90 units twice daily  He was on a regimen of Novolog 5 times a day with each meal and snack using 50 units as a base amount and also Lantus insulin 90 units 3 times a day Although his A1c in the last couple of years has been near normal he was reporting relatively high blood sugars later in the day He was also an to have frequent snacks during the day and had increased hunger with his multiple insulin injections  With switching to U-500 insulin and twice a day Toujeo his blood sugars appear to be markedly improved He was also asked to cut back on his food intake especially with his trying to eat more previously to help his anemia However he is checking his blood sugars very infrequently and only started recently using a new meter  Current blood sugar patterns and problems identified:  He has had blood sugar testing done mostly in the mornings and appears to be getting up earlier on 4-5 AM to check his sugar  His blood sugars aren't generally fairly good except a couple of high readings of 180-190 after supper  He tends  to have relatively lower readings midmorning  Hypoglycemia has occurred twice with low readings at about 10 AM and 10 PM respectively  Oral hypoglycemic drugs the patient is taking are: none      Compliance with the medical regimen: usually good Hypoglycemia: As above  Glucose monitoring:  done 3 times a day         Glucometer: Aviva      Blood Glucose readings by   Mean values apply above for all meters except median for One Touch  PRE-MEAL Fasting  8-9 AM   5-7 PM  Bedtime Overall  Glucose range:  86-202   57-99   139, 195   47, 181    Mean/median:         Self-care: The diet that the patient has been following is: tries to limit high-fat meals, usually eating 3 meals a day.     Typical meal intake: Breakfast is grits with pecans, olive oil. Dinner 6 pm               Dietician visit, most recent:2013               Exercise:  unable to do any  Weight history:  Wt Readings from Last 3 Encounters:  04/24/15 265 lb 9.6 oz (120.475 kg)  04/22/15 261 lb (118.389 kg)  04/11/15 265 lb (120.203 kg)    Glycemic control:   Lab Results  Component Value Date   HGBA1C 5.5 04/17/2015   HGBA1C 5.9* 10/24/2014   HGBA1C * 12/31/2009    8.7 (NOTE) The ADA recommends the following therapeutic goal for glycemic control related to Hgb A1c measurement: Goal of therapy: <6.5 Hgb A1c  Reference: American Diabetes Association: Clinical Practice Recommendations 2010, Diabetes Care, 2010, 33: (Suppl  1).   Lab Results  Component Value Date   [facility]  12/12/2007    55        Total Cholesterol/HDL:CHD Risk Coronary Heart Disease Risk Table                     Men   Women  1/2 Average Risk   3.4   3.3   CREATININE 1.90* 11/22/2014         Medication List       This list is accurate as of: 04/24/15  1:01 PM.  Always use your most recent med list.               ACCU-CHEK FASTCLIX LANCETS Misc  Use to check blood sugar 4 times per day dx code E11.9     aspirin 81 MG tablet  Take  81 mg by mouth daily.     aspirin 325 MG tablet  Take 325 mg by mouth daily.     calcitRIOL 0.5 MCG capsule  Commonly known as:  ROCALTROL  Take 0.5 mcg by mouth at bedtime.     clonazePAM 0.5 MG tablet  Commonly known as:  KLONOPIN  Take 0.5 mg by mouth 3 (three) times daily as needed for anxiety.     furosemide 80 MG tablet  Commonly known as:  LASIX  Take 2 tablets by mouth 2 (two) times daily.     glucose blood test strip  Commonly known as:  ACCU-CHEK SMARTVIEW  Use as instructed to check blood sugar 4 times per day dx code E11.9     Insulin Glargine 300 UNIT/ML Sopn  Commonly known as:  TOUJEO SOLOSTAR  Inject 90 Units into the skin 2 (two) times daily.     Insulin Pen Needle 32G X 4 MM Misc  Commonly known as:  BD PEN NEEDLE NANO U/F  Use 5 per day     Insulin Regular Human (Conc) 500 UNIT/ML Sopn  Commonly known as:  HUMULIN R U-500 KWIKPEN  Inject 10 Units into the skin once. U-500 insulin 10 before bfst, 25 before lunch and 20 before dinner, take 30 min before Sliding scale use for high sugars:  Sugar 150-199 take 2 units, 200-250= 4 units and over 250 take 6 units     mycophenolate 250 MG capsule  Commonly known as:  CELLCEPT  Take 750 mg by mouth 2 (two) times daily.     omeprazole 20 MG capsule  Commonly known as:  PRILOSEC  Take 20 mg by mouth daily.     potassium chloride SA 20 MEQ tablet  Commonly known as:  K-DUR,KLOR-CON  Take 2 tablets by mouth 2 (two) times daily.     predniSONE 5 MG tablet  Commonly known as:  DELTASONE  Take 5 mg by mouth daily.     pregabalin 50 MG capsule  Commonly known as:  LYRICA  Take 1 capsule (50 mg total) by mouth at bedtime.     rosuvastatin 20 MG tablet  Commonly known as:  CRESTOR  Take 1 tablet (20 mg total) by mouth at bedtime.     tacrolimus 0.5 MG capsule  Commonly known as:  PROGRAF  Take 1 mg by mouth 2 (two) times daily.     ULORIC 80 MG Tabs  Generic drug:  Febuxostat  Take 80 mg by mouth daily.          Allergies:  Allergies  Allergen Reactions  . Penicillins Rash and Other (See Comments)    fever  . Pork-Derived Products Anaphylaxis and Other (See Comments)    Fever   . Ace Inhibitors Cough    Past Medical History  Diagnosis Date  . Diabetes mellitus   . Hypertension   . Leg pain 02/05/11    with walking  . Weight gain   . Shortness of breath     when lying flat  . Diminished eyesight   . Hearing difficulty   . Chronic kidney disease     Dia;ysis MWF  . Anemia   . Nervousness(799.21)   . COPD (chronic obstructive pulmonary disease)   . CHF (congestive heart failure)   . Posttraumatic stress disorder   . Allergic rhinitis   . Dyslipidemia   . Morbid obesity   . Diabetic peripheral neuropathy   . Gait disorder   . Obstructive sleep apnea   . Bilateral carpal tunnel syndrome   . Anxiety disorder   . Gastroparesis   . Gout   . Diabetic retinopathy   . Renal transplant, status post 04/05/2014  . Incomplete bladder emptying   . Urethral stricture   . Aortic insufficiency     a. Echo 7/16:  Mod LVH, EF 50-55%, mild to mod AI, severe LAE, PASP 57 mmHg (LV ID end diastolic Q000111Q mm)    Past Surgical History  Procedure Laterality Date  . Kidney transplant  2009  . Cataract extraction w/ intraocular lens  implant, bilateral Bilateral   . Av fistula placement Bilateral   . Arteriovenous graft placement Bilateral "several"  . Cystoscopy w/ internal urethrotomy      Family History  Problem Relation Age of Onset  . Hypertension Mother   . Diabetes Father     Social History:  reports that he has never smoked. He has never used smokeless tobacco. He reports that he does not drink alcohol or use illicit drugs.    Review of Systems    Lipid history: He is on 20 mg Crestor Lipids checked by nephrologist show cholesterol 161, triglycerides 61, HDL 39 and LDL 110 in 03/2015    Lab Results  Component Value Date   CHOL  12/12/2007    121        ATP III  CLASSIFICATION:  <200     mg/dL   Desirable  200-239  mg/dL   Borderline High  >=240    mg/dL   High   HDL 31* 12/12/2007   LDLCALC  12/12/2007    55        Total Cholesterol/HDL:CHD Risk Coronary Heart Disease Risk Table                     Men   Women  1/2 Average Risk   3.4   3.3   TRIG 175* 12/12/2007   CHOLHDL 3.9 12/12/2007             Eyes: Most recent eye exam was 6/15  Hypertension: His blood pressure appears to be higher now and fairly consistent.  He thinks  he was told by his nephrologist to take the clonidine only as needed when his blood sugar is high.  He thinks he feels tired and sleepy when he has high blood pressure  RENAL: He has had a transplant in 2009 and last creatinine was 1.8.  Also is being followed for secondary hyperparathyroidism with last PTH level 148  Neurological: Has has had some decreased sensation in his feet, no burning.  Sometimes gets sharp pains in his feet and toes, no tingling He has been given Lyrica by his neurologist and takes 50 mg at night with only some relief Previously had relief with gabapentin but he thinks he had urinary incontinence with this.  Not clear if he had any benefit from Cymbalta He has had marked decrease in his balance and also some weakness in his legs, using a power scooter  Last foot exam was in 03/2015: Absent monofilament sensation in his feet and toes and extending up to the middle of the lower leg on the left and up to the ankle on the right  LABS:  No visits with results within 1 Week(s) from this visit. Latest known visit with results is:  Lab on 04/17/2015  Component Date Value Ref Range Status  . Hgb A1c MFr Bld 04/17/2015 5.5  4.6 - 6.5 % Final   Glycemic Control Guidelines for People with Diabetes:Non Diabetic:  <6%Goal of Therapy: <7%Additional Action Suggested:  >8%     Physical Examination:  BP 158/74 mmHg  Pulse 77  Temp(Src) 98 F (36.7 C)  Resp 16  Ht 6' (1.829 m)  Wt 265 lb 9.6 oz  (120.475 kg)  BMI 36.01 kg/m2  SpO2 97%          exam not indicated      ASSESSMENT:  Diabetes type 2, long-standing with obesity See history of present illness for detailed discussion of his current management, blood sugar patterns and problems identified  His blood sugars are looking excellent with starting U-500 insulin along with twice a day Toujeo. He has done well with this regimen and is compliant with this which is more convenient than his previous regimen of 8 injections a day Although he has been advised to cut back on his portions he could benefit from consultation with dietitian He does however need to check his blood sugars more consistently, does have the strips available now  HYPERTENSION: This is poorly controlled and consistently has high blood pressure.  He needs to be on clonidine on a regular basis and he will follow-up with nephrologist also  PLAN:   Start checking blood sugars more consistently throughout the day including after meals  Reduce insulin by 2 units at breakfast and suppertime  Make sure he takes insulin 30 minutes before eating  Start taking clonidine consistently every 8 hours  Patient Instructions  U-500 insulin; take 30 min before meals 8 units before bfst, 25 before lunch and 18 before dinner  Check blood sugars on waking up ..3-4.Marland Kitchen times a week Also check blood sugars about 2 hours after a meal and do this after different meals by rotation Recommended blood sugar levels on waking up is 90-130 and about 2 hours after meal is 140-180 Please bring blood sugar monitor to each visit.   Clonidine 0.1 mg every 8 hrs regularly    Counseling time on subjects discussed above is over 50% of today's 25 minute visit  Tyianna Menefee 04/24/2015, 1:01 PM   Note: This office note was prepared with Dragon voice  recognition system technology. Any transcriptional errors that result from this process are unintentional.

## 2015-04-24 NOTE — Patient Instructions (Addendum)
U-500 insulin; take 30 min before meals 8 units before bfst, 25 before lunch and 18 before dinner  Check blood sugars on waking up ..3-4.Marland Kitchen times a week Also check blood sugars about 2 hours after a meal and do this after different meals by rotation Recommended blood sugar levels on waking up is 90-130 and about 2 hours after meal is 140-180 Please bring blood sugar monitor to each visit.   Clonidine 0.1 mg every 8 hrs regularly

## 2015-04-25 ENCOUNTER — Ambulatory Visit: Payer: Medicare Other | Admitting: Physical Therapy

## 2015-04-30 ENCOUNTER — Encounter: Payer: Self-pay | Admitting: Physical Therapy

## 2015-04-30 ENCOUNTER — Ambulatory Visit: Payer: Medicare Other | Admitting: Physical Therapy

## 2015-04-30 DIAGNOSIS — R269 Unspecified abnormalities of gait and mobility: Secondary | ICD-10-CM | POA: Diagnosis not present

## 2015-04-30 DIAGNOSIS — R262 Difficulty in walking, not elsewhere classified: Secondary | ICD-10-CM | POA: Diagnosis not present

## 2015-04-30 DIAGNOSIS — M25511 Pain in right shoulder: Secondary | ICD-10-CM | POA: Diagnosis not present

## 2015-04-30 DIAGNOSIS — M545 Low back pain: Secondary | ICD-10-CM | POA: Diagnosis not present

## 2015-04-30 DIAGNOSIS — R5381 Other malaise: Secondary | ICD-10-CM | POA: Diagnosis not present

## 2015-04-30 NOTE — Therapy (Signed)
[place] [place] [address], [place], [address] Phone: [phone]   Fax:  [phone]  Physical Therapy Evaluation  Patient Details  Name: [name] [name] MRN: [id number] Date of Birth: 04-16-1966 Referring Provider:  Earlie Server, MD  Encounter Date: 04/30/2015      PT End of Session - 04/30/15 1039    Visit Number 13   Date for PT Re-Evaluation 05/31/15   Authorization Type 1st visit for shoulder, I d/c'd the shoulder g-code so we should stay on the every 10th visit for back g-code   PT Start Time 1005   PT Stop Time 1100   PT Time Calculation (min) 55 min   Activity Tolerance Patient limited by pain      Past Medical History  Diagnosis Date  . Diabetes mellitus   . Hypertension   . Leg pain 02/05/11    with walking  . Weight gain   . Shortness of breath     when lying flat  . Diminished eyesight   . Hearing difficulty   . Chronic kidney disease     Dia;ysis MWF  . Anemia   . Nervousness(799.21)   . COPD (chronic obstructive pulmonary disease)   . CHF (congestive heart failure)   . Posttraumatic stress disorder   . Allergic rhinitis   . Dyslipidemia   . Morbid obesity   . Diabetic peripheral neuropathy   . Gait disorder   . Obstructive sleep apnea   . Bilateral carpal tunnel syndrome   . Anxiety disorder   . Gastroparesis   . Gout   . Diabetic retinopathy   . Renal transplant, status post 04/05/2014  . Incomplete bladder emptying   . Urethral stricture   . Aortic insufficiency     a. Echo 7/16:  Mod LVH, EF 50-55%, mild to mod AI, severe LAE, PASP 57 mmHg (LV ID end diastolic Q000111Q mm)    Past Surgical History  Procedure Laterality Date  . Kidney transplant  2009  . Cataract extraction w/ intraocular lens  implant, bilateral Bilateral   . Av fistula placement Bilateral   . Arteriovenous graft placement Bilateral "several"  . Cystoscopy w/ internal urethrotomy      There were no  vitals filed for this visit.  Visit Diagnosis:  Right shoulder pain - Plan: PT plan of care cert/re-cert      Subjective Assessment - 04/30/15 1013    Subjective Patient is currently being seen by [place] for LBP and debility.  He comes in today with a prescription for right shoulder pain.  He reports it started after 4 month hospital stay.   Diagnostic tests MRI negative no tears   Patient Stated Goals less pain with shoulder, be able to sleep on right side and dress iwthout difficulty   Currently in Pain? Yes   Pain Score 5    Pain Location Shoulder   Pain Orientation Right   Pain Descriptors / Indicators Sharp   Pain Type Chronic pain   Pain Onset More than a month ago   Pain Frequency Intermittent   Aggravating Factors  any reaching, lifting, lying on the right side   Pain Relieving Factors rest   Effect of Pain on Daily Activities difficulty with all ADL's            [facility] PT Assessment - 04/30/15 0001    Assessment   Medical Diagnosis right shoulder pain   Onset Date/Surgical Date 10/05/14   Prior Therapy  none for the shoulder   Precautions   Precautions Fall   Balance Screen   Has the patient fallen in the past 6 months Yes   How many times? 1   Has the patient had a decrease in activity level because of a fear of falling?  No   Is the patient reluctant to leave their home because of a fear of falling?  No   Home Environment   Living Environment Private residence   Additional Comments has stairs, he reports he only does one time a day due to how difficult it is, he has to go up and down on hands and knees or on his backside.  He reports that he uses an electric chair on the first floor and to leave his home, he reports that he crawls on hands and knees on the second floor.   Prior Function   Level of Independence Independent with community mobility with device;Independent with household mobility with device   Vocation Student   AROM   Right Shoulder Flexion 120 Degrees    Right Shoulder ABduction 120 Degrees   Right Shoulder Internal Rotation 10 Degrees   Right Shoulder External Rotation 70 Degrees   Strength   Overall Strength Comments right shoulder 4-/5 with pain for all motions   Palpation   Palpation comment very tender to palpation with spasms in the upper trap, tender biceps and deltoid   Special Tests    Special Tests Rotator Cuff Impingement   Rotator Cuff Impingment tests Neer impingement test   Neer Impingement test    Findings Positive   Side Right                   [facility] Adult PT Treatment/Exercise - 04/30/15 0001    Moist Heat Therapy   Number Minutes Moist Heat 15 Minutes   Electrical Stimulation   Electrical Stimulation Location right shoulder upper trap area   Electrical Stimulation Action IFC   Electrical Stimulation Parameters tolerance   Electrical Stimulation Goals Pain                PT Education - 04/30/15 1039    Education provided Yes   Education Details red tband scapular stability and ER   Person(s) Educated Patient   Methods Explanation;Demonstration;Handout;Verbal cues   Comprehension Verbalized understanding;Returned demonstration          PT Short Term Goals - 04/08/15 1059    PT SHORT TERM GOAL #1   Title independent with initial HEP   Status Achieved           PT Long Term Goals - 04/30/15 1043    PT LONG TERM GOAL #5   Title decrease right shoulder pain 50%   Time 8   Period Weeks   Status New   Additional Long Term Goals   Additional Long Term Goals Yes   PT LONG TERM GOAL #6   Title dress without pain   Time 8   Period Weeks   Status New               Plan - 04/30/15 1040    Clinical Impression Statement Patient with right shoulder pain evaluated today, has limited ROM, pain with any lifitng, seems to be impingement in nature.  we will start to add shoulder treatment but keep doing the  back and debility treatment   Pt will benefit from skilled therapeutic  intervention in order to improve on the following deficits Abnormal gait;Cardiopulmonary status  limiting activity;Decreased activity tolerance;Decreased balance;Decreased mobility;Decreased endurance;Decreased range of motion;Decreased strength;Difficulty walking;Pain   Rehab Potential Fair   PT Frequency 2x / week   PT Duration 8 weeks   PT Treatment/Interventions Gait training;Functional mobility training;Therapeutic activities;Therapeutic exercise;Balance training;Neuromuscular re-education;Manual techniques;Patient/family education;Ultrasound;Iontophoresis 4mg /ml Dexamethasone   PT Next Visit Plan add shoulder activities to include stabilization   Consulted and Agree with Plan of Care Patient          G-Codes - 05/15/2015 1044    Functional Assessment Tool Used FOTO   Functional Limitation Carrying, moving and handling objects   Carrying, Moving and Handling Objects Current Status [phone]) At least 60 percent but less than 80 percent impaired, limited or restricted   Carrying, Moving and Handling Objects Goal Status [id number]) At least 20 percent but less than 40 percent impaired, limited or restricted   Carrying, Moving and Handling Objects Discharge Status [phone]) At least 60 percent but less than 80 percent impaired, limited or restricted       Problem List Patient Active Problem List   Diagnosis Date Noted  . Aortic insufficiency 04/12/2015  . Back pain   . Urine retention   . Back pain at L4-L5 level   . Weakness of both legs   . Anemia due to chronic kidney disease   . Low back pain   . Cervical radiculopathy   . Diabetic peripheral neuropathy 10/16/2014  . Renal transplant, status post 04/05/2014  . OSA (obstructive sleep apnea) 04/05/2014  . Severe obesity (BMI >= 40) 04/05/2014  . Benign fibroma of prostate 02/05/2014  . Abdominal apron 04/08/2012  . Chronic kidney failure 03/28/2012  . Benign essential HTN 03/24/2012  . H/O kidney transplant 03/24/2012  . Diabetic  macular edema 01/15/2012  . Diabetes mellitus, type 2 01/15/2012  . Diabetic proliferative retinopathy 01/15/2012  . CHEST PAIN 06/03/2010  . IMMUNOCOMPROMISED 01/30/2010  . RECTAL PAIN 01/30/2010  . NAUSEA AND VOMITING 01/30/2010  . KIDNEY TRANSPLANTATION, HX OF 01/30/2010  . HYPERPARATHYROIDISM, SECONDARY 01/27/2010  . GOUT 01/27/2010  . Anxiety state 01/27/2010  . GASTROPARESIS 01/27/2010  . END STAGE RENAL DISEASE 01/27/2010  . FROZEN LEFT SHOULDER 01/27/2010  . INSOMNIA 01/27/2010  . HLD (hyperlipidemia) 05/25/2008  . OBSTRUCTIVE SLEEP APNEA 05/25/2008  . Essential hypertension 05/25/2008    Sumner Boast., PT 05/15/2015, 11:08 AM  [address] [place], [place], [address] Phone: [phone]   Fax:  [phone]

## 2015-05-02 ENCOUNTER — Ambulatory Visit: Payer: Medicare Other | Admitting: Physical Therapy

## 2015-05-03 ENCOUNTER — Other Ambulatory Visit: Payer: Self-pay | Admitting: Neurology

## 2015-05-03 DIAGNOSIS — D649 Anemia, unspecified: Secondary | ICD-10-CM | POA: Diagnosis not present

## 2015-05-03 DIAGNOSIS — Z940 Kidney transplant status: Secondary | ICD-10-CM | POA: Diagnosis not present

## 2015-05-03 DIAGNOSIS — N390 Urinary tract infection, site not specified: Secondary | ICD-10-CM | POA: Diagnosis not present

## 2015-05-07 ENCOUNTER — Ambulatory Visit: Payer: Medicare Other | Admitting: Physical Therapy

## 2015-05-07 NOTE — Telephone Encounter (Signed)
ERROR

## 2015-05-08 ENCOUNTER — Other Ambulatory Visit: Payer: Self-pay | Admitting: Neurology

## 2015-05-08 DIAGNOSIS — Z794 Long term (current) use of insulin: Secondary | ICD-10-CM | POA: Diagnosis not present

## 2015-05-08 DIAGNOSIS — Z79899 Other long term (current) drug therapy: Secondary | ICD-10-CM | POA: Diagnosis not present

## 2015-05-08 DIAGNOSIS — N186 End stage renal disease: Secondary | ICD-10-CM | POA: Diagnosis not present

## 2015-05-08 DIAGNOSIS — E11319 Type 2 diabetes mellitus with unspecified diabetic retinopathy without macular edema: Secondary | ICD-10-CM | POA: Diagnosis not present

## 2015-05-08 DIAGNOSIS — E1122 Type 2 diabetes mellitus with diabetic chronic kidney disease: Secondary | ICD-10-CM | POA: Diagnosis not present

## 2015-05-08 DIAGNOSIS — E119 Type 2 diabetes mellitus without complications: Secondary | ICD-10-CM | POA: Diagnosis not present

## 2015-05-08 DIAGNOSIS — Z940 Kidney transplant status: Secondary | ICD-10-CM | POA: Diagnosis not present

## 2015-05-08 DIAGNOSIS — Z4822 Encounter for aftercare following kidney transplant: Secondary | ICD-10-CM | POA: Diagnosis not present

## 2015-05-08 DIAGNOSIS — I10 Essential (primary) hypertension: Secondary | ICD-10-CM | POA: Diagnosis not present

## 2015-05-08 DIAGNOSIS — E785 Hyperlipidemia, unspecified: Secondary | ICD-10-CM | POA: Diagnosis not present

## 2015-05-08 DIAGNOSIS — I120 Hypertensive chronic kidney disease with stage 5 chronic kidney disease or end stage renal disease: Secondary | ICD-10-CM | POA: Diagnosis not present

## 2015-05-09 ENCOUNTER — Ambulatory Visit: Payer: Medicare Other | Admitting: Physical Therapy

## 2015-05-09 NOTE — Telephone Encounter (Signed)
Rx signed and faxed.

## 2015-05-10 ENCOUNTER — Ambulatory Visit: Payer: Medicare Other | Admitting: Physical Therapy

## 2015-05-10 ENCOUNTER — Encounter: Payer: Self-pay | Admitting: Physical Therapy

## 2015-05-10 DIAGNOSIS — M25511 Pain in right shoulder: Secondary | ICD-10-CM | POA: Diagnosis not present

## 2015-05-10 DIAGNOSIS — M545 Low back pain, unspecified: Secondary | ICD-10-CM

## 2015-05-10 DIAGNOSIS — R262 Difficulty in walking, not elsewhere classified: Secondary | ICD-10-CM | POA: Diagnosis not present

## 2015-05-10 DIAGNOSIS — R5381 Other malaise: Secondary | ICD-10-CM | POA: Diagnosis not present

## 2015-05-10 DIAGNOSIS — R269 Unspecified abnormalities of gait and mobility: Secondary | ICD-10-CM | POA: Diagnosis not present

## 2015-05-10 NOTE — Therapy (Signed)
[place], [place], [address] Phone: [phone]   Fax:  [phone]  Physical Therapy Treatment  Patient Details  Name: [name] [name] MRN: [id number] Date of Birth: Jun 16, 1966 Referring Provider:  Earlie Server, MD  Encounter Date: 05/10/2015      PT End of Session - 05/10/15 0844    Visit Number 14   Date for PT Re-Evaluation 05/31/15   PT Start Time 1700   PT Stop Time 0855   PT Time Calculation (min) 62 min   Activity Tolerance Patient tolerated treatment well   Behavior During Therapy [facility] for tasks assessed/performed      Past Medical History  Diagnosis Date  . Diabetes mellitus   . Hypertension   . Leg pain 02/05/11    with walking  . Weight gain   . Shortness of breath     when lying flat  . Diminished eyesight   . Hearing difficulty   . Chronic kidney disease     Dia;ysis MWF  . Anemia   . Nervousness(799.21)   . COPD (chronic obstructive pulmonary disease)   . CHF (congestive heart failure)   . Posttraumatic stress disorder   . Allergic rhinitis   . Dyslipidemia   . Morbid obesity   . Diabetic peripheral neuropathy   . Gait disorder   . Obstructive sleep apnea   . Bilateral carpal tunnel syndrome   . Anxiety disorder   . Gastroparesis   . Gout   . Diabetic retinopathy   . Renal transplant, status post 04/05/2014  . Incomplete bladder emptying   . Urethral stricture   . Aortic insufficiency     a. Echo 7/16:  Mod LVH, EF 50-55%, mild to mod AI, severe LAE, PASP 57 mmHg (LV ID end diastolic 17.4 mm)    Past Surgical History  Procedure Laterality Date  . Kidney transplant  2009  . Cataract extraction w/ intraocular lens  implant, bilateral Bilateral   . Av fistula placement Bilateral   . Arteriovenous graft placement Bilateral "several"  . Cystoscopy w/ internal urethrotomy      There were no vitals filed for this visit.  Visit Diagnosis:  Right shoulder  pain  Difficulty walking  Bilateral low back pain without sciatica  Debility  Abnormality of gait      Subjective Assessment - 05/10/15 0756    Subjective Pain comes and goes, I have been having transportation issues and have not been able to get in in the past month.   Currently in Pain? Yes   Pain Score 3    Pain Location Back   Pain Orientation Lower   Pain Descriptors / Indicators Aching   Aggravating Factors  any standing or walking   Pain Relieving Factors rest                         [facility] Adult PT Treatment/Exercise - 05/10/15 0001    Ambulation/Gait   Gait Comments gait with SPC and light hand hold assist, 60 feet x 3   Lumbar Exercises: Standing   Scapular Retraction Strengthening;20 reps;Theraband   Theraband Level (Scapular Retraction) Level 4 (Blue)   Scapular Retraction Limitations 2 ways   Knee/Hip Exercises: Aerobic   Tread Mill UBE Level 5 x 6 minutes   Isokinetic NuStep level 5  x10 min    Knee/Hip Exercises: Machines for Strengthening   Cybex Knee Extension 15# 2 sets 15  Cybex Knee Flexion 25# 3 sets 10   Knee/Hip Exercises: Standing   Other Standing Knee Exercises sit to stand X 5; Sit ti stand on on airex X5    Knee/Hip Exercises: Seated   Other Seated Knee/Hip Exercises marching 6# 2x15, ball squeeze 2x10 3 sec hold; hip abdct blue tband 2x15   Moist Heat Therapy   Number Minutes Moist Heat 15 Minutes   Moist Heat Location Lumbar Spine   Electrical Stimulation   Electrical Stimulation Location low back and right shoulder   Electrical Stimulation Action premod   Electrical Stimulation Parameters tolerance   Electrical Stimulation Goals Pain                  PT Short Term Goals - 04/08/15 1059    PT SHORT TERM GOAL #1   Title independent with initial HEP   Status Achieved           PT Long Term Goals - 05/10/15 0846    PT LONG TERM GOAL #2   Title decrease pain 50% when walking   Status Partially Met                Plan - 05/10/15 0844    Clinical Impression Statement Patient has had difficulty with transportation and has not been here in two weeks.  He c/o pain and difficulty with mobility   PT Next Visit Plan Continue to work on mobility   Consulted and Agree with Plan of Care Patient        Problem List Patient Active Problem List   Diagnosis Date Noted  . Aortic insufficiency 04/12/2015  . Back pain   . Urine retention   . Back pain at L4-L5 level   . Weakness of both legs   . Anemia due to chronic kidney disease   . Low back pain   . Cervical radiculopathy   . Diabetic peripheral neuropathy 10/16/2014  . Renal transplant, status post 04/05/2014  . OSA (obstructive sleep apnea) 04/05/2014  . Severe obesity (BMI >= 40) 04/05/2014  . Benign fibroma of prostate 02/05/2014  . Abdominal apron 04/08/2012  . Chronic kidney failure 03/28/2012  . Benign essential HTN 03/24/2012  . H/O kidney transplant 03/24/2012  . Diabetic macular edema 01/15/2012  . Diabetes mellitus, type 2 01/15/2012  . Diabetic proliferative retinopathy 01/15/2012  . CHEST PAIN 06/03/2010  . IMMUNOCOMPROMISED 01/30/2010  . RECTAL PAIN 01/30/2010  . NAUSEA AND VOMITING 01/30/2010  . KIDNEY TRANSPLANTATION, HX OF 01/30/2010  . HYPERPARATHYROIDISM, SECONDARY 01/27/2010  . GOUT 01/27/2010  . Anxiety state 01/27/2010  . GASTROPARESIS 01/27/2010  . END STAGE RENAL DISEASE 01/27/2010  . FROZEN LEFT SHOULDER 01/27/2010  . INSOMNIA 01/27/2010  . HLD (hyperlipidemia) 05/25/2008  . OBSTRUCTIVE SLEEP APNEA 05/25/2008  . Essential hypertension 05/25/2008    Sumner Boast., PT 05/10/2015, 8:47 AM  [address] [place], [place], [address] Phone: [phone]   Fax:  [phone]

## 2015-05-13 ENCOUNTER — Ambulatory Visit: Payer: Medicare Other | Attending: Orthopedic Surgery | Admitting: Physical Therapy

## 2015-05-13 DIAGNOSIS — M25511 Pain in right shoulder: Secondary | ICD-10-CM | POA: Insufficient documentation

## 2015-05-13 DIAGNOSIS — R262 Difficulty in walking, not elsewhere classified: Secondary | ICD-10-CM | POA: Insufficient documentation

## 2015-05-13 DIAGNOSIS — R269 Unspecified abnormalities of gait and mobility: Secondary | ICD-10-CM | POA: Insufficient documentation

## 2015-05-13 DIAGNOSIS — M545 Low back pain: Secondary | ICD-10-CM | POA: Insufficient documentation

## 2015-05-13 DIAGNOSIS — R5381 Other malaise: Secondary | ICD-10-CM | POA: Insufficient documentation

## 2015-05-14 ENCOUNTER — Other Ambulatory Visit (HOSPITAL_COMMUNITY): Payer: Self-pay | Admitting: *Deleted

## 2015-05-14 NOTE — Progress Notes (Signed)
Chief Complaint  Patient presents with  . Follow-up      History of Present Illness: 49 yo [name] male with history of IDDM, HTN, hyperlipidemia, obesity and ESRD s/p kidney transpant in 2009 at [facility] in [place] who is here today for follow up. I saw him in 2011 for evaluation of chest pain. He was last seen here in October 2012. He is followed by Dr. Jamal Maes at [facility]. Since his renal transplant, he has described large weight gain and seemed to think that his edema is more significant on his left side involving the left arm and leg. He is almost entirely immobile because of his leg weakness, swelling and pain. He told me that he has had multiple negative venous dopplers in the past. At the first visit, I ordered an echo and a Lexiscan myoview. His echo showed moderate LVH c/w hypertensive heart disease. There were no severe valvular abnormalities. His stress myoview showed a defect in the anterior wall, likely artifact. Normal wall motion and no ischemia. This was a low risk study. I had not seen him since 2013. He was seen in our office June 2016 by Richardson Dopp, PA-C. The patient was admitted earlier this year to [facility] with lumbar discitis. He was discharged to SNF. He was ultimately readmitted to [facility] and then transferred to [facility] He was thought to have discitis versus osteomyelitis. Blood cultures were positive for enterococcus bacteremia. Echocardiogram demonstrated aortic valve endocarditis with severe aortic insufficiency. Surgical correction was recommended after appropriate IV antibiotics. The patient did not tolerate cardiac catheterization and this was never performed. He has missed several appointments at [facility] and follow-ups with the surgeons to schedule his surgery. The patient has been participating in physical therapy. He followed up with infectious disease last month at [facility]. Follow-up blood  cultures 02/27/15 demonstrated no growth. Echo 04/17/15 with normal LV function and mild to moderate AI. He has completed antibiotics.    He is here today for follow up. He has been doing well.   No chest pain.  Breathing has been ok. His weight has been stable.   Primary Care Physician: [facility] Nephrology: Dr. Jamal Maes   Past Medical History  Diagnosis Date  . Diabetes mellitus   . Hypertension   . Leg pain 02/05/11    with walking  . Weight gain   . Shortness of breath     when lying flat  . Diminished eyesight   . Hearing difficulty   . Chronic kidney disease     Dia;ysis MWF  . Anemia   . Nervousness(799.21)   . COPD (chronic obstructive pulmonary disease)   . CHF (congestive heart failure)   . Posttraumatic stress disorder   . Allergic rhinitis   . Dyslipidemia   . Morbid obesity   . Diabetic peripheral neuropathy   . Gait disorder   . Obstructive sleep apnea   . Bilateral carpal tunnel syndrome   . Anxiety disorder   . Gastroparesis   . Gout   . Diabetic retinopathy   . Renal transplant, status post 04/05/2014  . Incomplete bladder emptying   . Urethral stricture   . Aortic insufficiency     a. Echo 7/16:  Mod LVH, EF 50-55%, mild to mod AI, severe LAE, PASP 57 mmHg (LV ID end diastolic Q000111Q mm)    Past Surgical History  Procedure Laterality Date  . Kidney transplant  2009  . Cataract extraction  w/ intraocular lens  implant, bilateral Bilateral   . Av fistula placement Bilateral   . Arteriovenous graft placement Bilateral "several"  . Cystoscopy w/ internal urethrotomy      Current Outpatient Prescriptions  Medication Sig Dispense Refill  . ACCU-CHEK FASTCLIX LANCETS MISC Use to check blood sugar 4 times per day dx code E11.9 408 each 1  . aspirin 325 MG tablet Take 325 mg by mouth daily.     Marland Kitchen aspirin 81 MG tablet Take 81 mg by mouth daily.    . calcitRIOL (ROCALTROL) 0.5 MCG capsule Take 0.5 mcg by mouth at bedtime.     . clonazePAM (KLONOPIN) 0.5  MG tablet Take 1 mg by mouth.    . Febuxostat (ULORIC) 80 MG TABS Take 80 mg by mouth daily.    . furosemide (LASIX) 80 MG tablet Take 2 tablets by mouth 2 (two) times daily.    Marland Kitchen glucose blood (ACCU-CHEK SMARTVIEW) test strip Use as instructed to check blood sugar 4 times per day dx code E11.9 400 each 1  . Insulin Glargine (TOUJEO SOLOSTAR) 300 UNIT/ML SOPN Inject 90 Units into the skin 2 (two) times daily. 30 mL 1  . Insulin Pen Needle (BD PEN NEEDLE NANO U/F) 32G X 4 MM MISC Use 5 per day 450 each 1  . Insulin Regular Human, Conc, (HUMULIN R U-500 KWIKPEN) 500 UNIT/ML SOPN Inject 10 Units into the skin once. U-500 insulin 10 before bfst, 25 before lunch and 20 before dinner, take 30 min before Sliding scale use for high sugars:  Sugar 150-199 take 2 units, 200-250= 4 units and over 250 take 6 units 3 pen 1  . mycophenolate (CELLCEPT) 250 MG capsule Take 750 mg by mouth 2 (two) times daily.     Marland Kitchen omeprazole (PRILOSEC) 20 MG capsule Take 20 mg by mouth daily.    . potassium chloride SA (K-DUR,KLOR-CON) 20 MEQ tablet Take 2 tablets by mouth 2 (two) times daily.    . predniSONE (DELTASONE) 5 MG tablet Take 5 mg by mouth daily.      . pregabalin (LYRICA) 100 MG capsule Take by mouth.    . rosuvastatin (CRESTOR) 20 MG tablet Take 1 tablet (20 mg total) by mouth at bedtime. 90 tablet 3  . tacrolimus (PROGRAF) 0.5 MG capsule Take 1 mg by mouth 2 (two) times daily.      No current facility-administered medications for this visit.    Allergies  Allergen Reactions  . Penicillins Rash and Other (See Comments)    fever  . Pork-Derived Products Anaphylaxis and Other (See Comments)    Fever   . Ace Inhibitors Cough    History   Social History  . Marital Status: Divorced    Spouse Name: N/A  . Number of Children: 0  . Years of Education: College    Occupational History  .     Social History Main Topics  . Smoking status: Never Smoker   . Smokeless tobacco: Never Used  . Alcohol Use:  No  . Drug Use: No  . Sexual Activity: Not on file   Other Topics Concern  . Not on file   Social History Narrative   Patient is Divorced.   Patient does not have any children.   Patient is right-handed   Patient is currently in college working on his Ph.D.          Family History  Problem Relation Age of Onset  . Hypertension Mother   . Diabetes  Father     Review of Systems:  As stated in the HPI and otherwise negative.   BP 110/50 mmHg  Pulse 64  Ht 6' (1.829 m)  Wt 256 lb (116.121 kg)  BMI 34.71 kg/m2  SpO2 98%  Physical Examination: General: Well developed, well nourished, NAD HEENT: OP clear, mucus membranes moist SKIN: warm, dry. No rashes. Neuro: No focal deficits Musculoskeletal: Muscle strength 5/5 all ext Psychiatric: Mood and affect normal Neck: No JVD, no carotid bruits, no thyromegaly, no lymphadenopathy. Lungs:Clear bilaterally, no wheezes, rhonci, crackles Cardiovascular: Regular rate and rhythm. No murmurs, gallops or rubs. Abdomen:Distended. Firm. Bowel sounds present. Non-tender.  Extremities: No lower extremity edema. Pulses are 2 + in the bilateral DP/PT.  TEE 11/2014 at [place] Left ventricular systolic function is normal. Small echodensity (vegetation) seen on the ventricular side of the left cusp of the aortic valve with partial flail and associated with severe eccentric, aortic regurgitation. Very short PHT 196 msc, and jet occupys > 65% of LVOT There is trace mitral regurgitation. There is no vegetation seen on the mitral valve. There is mild tricuspid regurgitation. There is no tricuspid valve vegetation. Moderate, focal atherosclerotic plaque(s) in the ascending aorta.There is no pericardial effusion.  Echo 04/17/15: Left ventricle: The cavity size was mildly dilated. Wall thickness was increased in a pattern of moderate LVH. Systolic function was normal. The estimated ejection fraction was in the range of 50% to 55%. -  Aortic valve: There was mild to moderate regurgitation. - Left atrium: The atrium was severely dilated. - Atrial septum: No defect or patent foramen ovale was identified. - Pulmonary arteries: PA peak pressure: 57 mm Hg (S).  EKG:  EKG is not ordered today. The ekg ordered today demonstrates   Recent Labs: 10/24/2014: ALT 12 11/22/2014: BUN 41*; Creatinine, Ser 1.90*; Hemoglobin 11.9*; Platelets 281; Potassium 4.6; Sodium 139    Wt Readings from Last 3 Encounters:  05/15/15 256 lb (116.121 kg)  04/24/15 265 lb 9.6 oz (120.475 kg)  04/22/15 261 lb (118.389 kg)     Other studies Reviewed: Additional studies/ records that were reviewed today include: . Review of the above records demonstrates:    Assessment and Plan:   1. Aortic insufficiency: Patient was noted to have severe aortic insufficiency at the time of TEE in February 2016 during his hospitalization for enterococcal bacteremia. Surgery was recommended. However, he declined and failed to follow-up with the surgeons at [facility].  Echo 04/17/15 with mild to moderate AI. He has no signs of heart failure. He does not wish to consider surgical referral. Will repeat echo in 6 months.   2. Endocarditis: He underwent treatment with IV antibiotics for 6 weeks. He last saw infectious disease at [facility] in May 2016 and was released.   3. Hypertensive heart disease with heart failure : Blood pressure managed by nephrology.  4. Chronic diastolic CHF (congestive heart failure): Volume managed by nephrology. Weight stable. He is on Lasix.   5. HLD (hyperlipidemia): He remains on statin therapy.  6. End stage renal disease: He is status post renal transplant. Recent creatinine stable.  Current medicines are reviewed at length with the patient today.  The patient does not have concerns regarding medicines.  The following changes have been made:  no change  Labs/ tests ordered today include:  No orders of the defined types were  placed in this encounter.     Disposition:   FU with me in 6 months   Signed,  Lauree Chandler, MD 05/15/2015 2:49 PM    [facility] [address], [place], [place]  [address] Phone: [phone]; Fax: [phone]

## 2015-05-15 ENCOUNTER — Ambulatory Visit (INDEPENDENT_AMBULATORY_CARE_PROVIDER_SITE_OTHER): Payer: Medicare Other | Admitting: Cardiovascular Disease

## 2015-05-15 ENCOUNTER — Encounter: Payer: Self-pay | Admitting: Cardiovascular Disease

## 2015-05-15 ENCOUNTER — Encounter (HOSPITAL_COMMUNITY)
Admission: RE | Admit: 2015-05-15 | Discharge: 2015-05-15 | Disposition: A | Discharge status: Home / Self Care | Payer: Medicare Other | Source: Ambulatory Visit | Attending: Nephrology | Admitting: Nephrology

## 2015-05-15 VITALS — BP 110/50 | HR 64 | Ht 72.0 in | Wt 256.0 lb

## 2015-05-15 DIAGNOSIS — D509 Iron deficiency anemia, unspecified: Secondary | ICD-10-CM | POA: Insufficient documentation

## 2015-05-15 DIAGNOSIS — I351 Nonrheumatic aortic (valve) insufficiency: Secondary | ICD-10-CM

## 2015-05-15 DIAGNOSIS — N186 End stage renal disease: Secondary | ICD-10-CM

## 2015-05-15 DIAGNOSIS — I5032 Chronic diastolic (congestive) heart failure: Secondary | ICD-10-CM

## 2015-05-15 DIAGNOSIS — I38 Endocarditis, valve unspecified: Secondary | ICD-10-CM | POA: Diagnosis not present

## 2015-05-15 MED ORDER — SODIUM CHLORIDE 0.9 % IV SOLN
510.0000 mg | Freq: Once | INTRAVENOUS | Status: AC
  Administered 2015-05-15: 510 mg via INTRAVENOUS
  Filled 2015-05-15: qty 17

## 2015-05-15 NOTE — Patient Instructions (Signed)
Medication Instructions:  Your physician recommends that you continue on your current medications as directed. Please refer to the Current Medication list given to you today.   Labwork: none  Testing/Procedures: Your physician has requested that you have an echocardiogram. Echocardiography is a painless test that uses sound waves to create images of your heart. It provides your doctor with information about the size and shape of your heart and how well your heart's chambers and valves are working. This procedure takes approximately one hour. There are no restrictions for this procedure.  Scheduled for January 9,2017    Follow-Up: Your physician wants you to follow-up in: 6 months.  You will receive a reminder letter in the mail two months in advance. If you don't receive a letter, please call our office to schedule the follow-up appointment.   Any Other Special Instructions Will Be Listed Below (If Applicable).

## 2015-05-16 ENCOUNTER — Ambulatory Visit: Payer: Medicare Other | Admitting: Physical Therapy

## 2015-05-16 ENCOUNTER — Other Ambulatory Visit: Payer: Self-pay | Admitting: *Deleted

## 2015-05-16 ENCOUNTER — Telehealth: Payer: Self-pay | Admitting: Endocrinology

## 2015-05-16 ENCOUNTER — Encounter: Payer: Self-pay | Admitting: Physical Therapy

## 2015-05-16 DIAGNOSIS — R5381 Other malaise: Secondary | ICD-10-CM | POA: Diagnosis not present

## 2015-05-16 DIAGNOSIS — R262 Difficulty in walking, not elsewhere classified: Secondary | ICD-10-CM | POA: Diagnosis not present

## 2015-05-16 DIAGNOSIS — M545 Low back pain, unspecified: Secondary | ICD-10-CM

## 2015-05-16 DIAGNOSIS — M25511 Pain in right shoulder: Secondary | ICD-10-CM

## 2015-05-16 DIAGNOSIS — R269 Unspecified abnormalities of gait and mobility: Secondary | ICD-10-CM | POA: Diagnosis not present

## 2015-05-16 MED ORDER — INSULIN REGULAR HUMAN (CONC) 500 UNIT/ML ~~LOC~~ SOPN: 10.0000 [IU] | PEN_INJECTOR | Freq: Once | SUBCUTANEOUS | Status: DC

## 2015-05-16 MED ORDER — INSULIN PEN NEEDLE 32G X 4 MM MISC: Status: DC

## 2015-05-16 NOTE — Therapy (Signed)
[facility] [place] [place], [place], [address] Phone: [phone]   Fax:  [phone]  Physical Therapy Treatment  Patient Details  Name: [name] [name] MRN: [id number] Date of Birth: 06/16/1966 Referring Provider:  Earlie Server, MD  Encounter Date: 05/16/2015      PT End of Session - 05/16/15 1331    Visit Number 15   Date for PT Re-Evaluation 05/31/15   Activity Tolerance Patient limited by pain   Behavior During Therapy [facility] for tasks assessed/performed      Past Medical History  Diagnosis Date  . Diabetes mellitus   . Hypertension   . Leg pain 02/05/11    with walking  . Weight gain   . Shortness of breath     when lying flat  . Diminished eyesight   . Hearing difficulty   . Chronic kidney disease     Dia;ysis MWF  . Anemia   . Nervousness(799.21)   . COPD (chronic obstructive pulmonary disease)   . CHF (congestive heart failure)   . Posttraumatic stress disorder   . Allergic rhinitis   . Dyslipidemia   . Morbid obesity   . Diabetic peripheral neuropathy   . Gait disorder   . Obstructive sleep apnea   . Bilateral carpal tunnel syndrome   . Anxiety disorder   . Gastroparesis   . Gout   . Diabetic retinopathy   . Renal transplant, status post 04/05/2014  . Incomplete bladder emptying   . Urethral stricture   . Aortic insufficiency     a. Echo 7/16:  Mod LVH, EF 50-55%, mild to mod AI, severe LAE, PASP 57 mmHg (LV ID end diastolic 74.7 mm)    Past Surgical History  Procedure Laterality Date  . Kidney transplant  2009  . Cataract extraction w/ intraocular lens  implant, bilateral Bilateral   . Av fistula placement Bilateral   . Arteriovenous graft placement Bilateral "several"  . Cystoscopy w/ internal urethrotomy      There were no vitals filed for this visit.  Visit Diagnosis:  Right shoulder pain  Difficulty walking  Bilateral low back pain without  sciatica  Debility  Abnormality of gait      Subjective Assessment - 05/16/15 1323    Subjective Patient reports that he is having blood pressure issues today.  Reports that he is having increased pain in the back with standing,  He is to see MD tomorrow regarding HTN   Currently in Pain? Yes   Pain Score 6    Pain Location Back   Pain Orientation Lower                         [facility] Adult PT Treatment/Exercise - 05/16/15 0001    Lumbar Exercises: Standing   Scapular Retraction Strengthening;20 reps;Theraband   Theraband Level (Scapular Retraction) Level 4 (Blue)   Scapular Retraction Limitations 2 ways   Knee/Hip Exercises: Aerobic   Tread Mill UBE Level 5 x 6 minutes   Isokinetic NuStep level 5  x10 min    Knee/Hip Exercises: Machines for Strengthening   Cybex Knee Extension 15# 2 sets 15   Cybex Knee Flexion 25# 3 sets 10   Knee/Hip Exercises: Standing   Other Standing Knee Exercises ER with red tband   Knee/Hip Exercises: Seated   Other Seated Knee/Hip Exercises marching 6# 2x15, ball squeeze 2x10 3 sec hold; hip abdct blue tband 2x15   Moist  Heat Therapy   Number Minutes Moist Heat 15 Minutes   Moist Heat Location Lumbar Spine   Electrical Stimulation   Electrical Stimulation Location low back and right shoulder   Electrical Stimulation Action premod   Electrical Stimulation Parameters tolerance   Electrical Stimulation Goals Pain                  PT Short Term Goals - 04/08/15 1059    PT SHORT TERM GOAL #1   Title independent with initial HEP   Status Achieved           PT Long Term Goals - 05/10/15 0846    PT LONG TERM GOAL #2   Title decrease pain 50% when walking   Status Partially Met               Plan - 05/16/15 1331    Clinical Impression Statement HTN issues today, went slower and allowed more rests.  Pain with standing and transitional mvmnts   PT Next Visit Plan Continue to work on mobility   Consulted and  Agree with Plan of Care Patient        Problem List Patient Active Problem List   Diagnosis Date Noted  . Aortic insufficiency 04/12/2015  . Back pain   . Urine retention   . Back pain at L4-L5 level   . Weakness of both legs   . Anemia due to chronic kidney disease   . Low back pain   . Cervical radiculopathy   . Diabetic peripheral neuropathy 10/16/2014  . Renal transplant, status post 04/05/2014  . OSA (obstructive sleep apnea) 04/05/2014  . Severe obesity (BMI >= 40) 04/05/2014  . Benign fibroma of prostate 02/05/2014  . Abdominal apron 04/08/2012  . Chronic kidney failure 03/28/2012  . Benign essential HTN 03/24/2012  . H/O kidney transplant 03/24/2012  . Diabetic macular edema 01/15/2012  . Diabetes mellitus, type 2 01/15/2012  . Diabetic proliferative retinopathy 01/15/2012  . CHEST PAIN 06/03/2010  . IMMUNOCOMPROMISED 01/30/2010  . RECTAL PAIN 01/30/2010  . NAUSEA AND VOMITING 01/30/2010  . KIDNEY TRANSPLANTATION, HX OF 01/30/2010  . HYPERPARATHYROIDISM, SECONDARY 01/27/2010  . GOUT 01/27/2010  . Anxiety state 01/27/2010  . GASTROPARESIS 01/27/2010  . END STAGE RENAL DISEASE 01/27/2010  . FROZEN LEFT SHOULDER 01/27/2010  . INSOMNIA 01/27/2010  . HLD (hyperlipidemia) 05/25/2008  . OBSTRUCTIVE SLEEP APNEA 05/25/2008  . Essential hypertension 05/25/2008    Sumner Boast., PT 05/16/2015, 1:33 PM  [address] [place], [place], [address] Phone: [phone]   Fax:  [phone]

## 2015-05-16 NOTE — Telephone Encounter (Signed)
Pt is confused by the [id number] rx please call him with explanation

## 2015-05-17 ENCOUNTER — Encounter: Payer: Self-pay | Admitting: Physical Therapy

## 2015-05-17 DIAGNOSIS — I10 Essential (primary) hypertension: Secondary | ICD-10-CM | POA: Diagnosis not present

## 2015-05-17 DIAGNOSIS — Z01818 Encounter for other preprocedural examination: Secondary | ICD-10-CM | POA: Diagnosis not present

## 2015-05-17 DIAGNOSIS — R809 Proteinuria, unspecified: Secondary | ICD-10-CM | POA: Diagnosis not present

## 2015-05-17 DIAGNOSIS — T8611 Kidney transplant rejection: Secondary | ICD-10-CM | POA: Diagnosis not present

## 2015-05-17 DIAGNOSIS — E118 Type 2 diabetes mellitus with unspecified complications: Secondary | ICD-10-CM | POA: Diagnosis not present

## 2015-05-17 DIAGNOSIS — R7989 Other specified abnormal findings of blood chemistry: Secondary | ICD-10-CM | POA: Diagnosis not present

## 2015-05-17 DIAGNOSIS — Z7982 Long term (current) use of aspirin: Secondary | ICD-10-CM | POA: Diagnosis not present

## 2015-05-17 DIAGNOSIS — Z940 Kidney transplant status: Secondary | ICD-10-CM | POA: Diagnosis not present

## 2015-05-17 DIAGNOSIS — E785 Hyperlipidemia, unspecified: Secondary | ICD-10-CM | POA: Diagnosis not present

## 2015-05-17 DIAGNOSIS — I519 Heart disease, unspecified: Secondary | ICD-10-CM | POA: Diagnosis not present

## 2015-05-17 DIAGNOSIS — Z5181 Encounter for therapeutic drug level monitoring: Secondary | ICD-10-CM | POA: Diagnosis not present

## 2015-05-17 DIAGNOSIS — Z6834 Body mass index (BMI) 34.0-34.9, adult: Secondary | ICD-10-CM | POA: Diagnosis not present

## 2015-05-17 DIAGNOSIS — Z79899 Other long term (current) drug therapy: Secondary | ICD-10-CM | POA: Diagnosis not present

## 2015-05-17 DIAGNOSIS — E119 Type 2 diabetes mellitus without complications: Secondary | ICD-10-CM | POA: Diagnosis not present

## 2015-05-20 ENCOUNTER — Encounter: Payer: Self-pay | Admitting: Physical Therapy

## 2015-05-21 ENCOUNTER — Encounter: Payer: Self-pay | Admitting: Physical Therapy

## 2015-05-21 ENCOUNTER — Ambulatory Visit: Payer: Medicare Other | Admitting: Physical Therapy

## 2015-05-21 DIAGNOSIS — M25511 Pain in right shoulder: Secondary | ICD-10-CM

## 2015-05-21 DIAGNOSIS — M545 Low back pain, unspecified: Secondary | ICD-10-CM

## 2015-05-21 DIAGNOSIS — R262 Difficulty in walking, not elsewhere classified: Secondary | ICD-10-CM | POA: Diagnosis not present

## 2015-05-21 DIAGNOSIS — R5381 Other malaise: Secondary | ICD-10-CM | POA: Diagnosis not present

## 2015-05-21 DIAGNOSIS — R269 Unspecified abnormalities of gait and mobility: Secondary | ICD-10-CM | POA: Diagnosis not present

## 2015-05-21 NOTE — Therapy (Addendum)
[facility] [place] [address] [place], [place], [address] Phone: [phone]   Fax:  [phone]  Physical Therapy Treatment  Patient Details  Name: [name] [name] MRN: [id number] Date of Birth: 24-Oct-1965 Referring Provider:  Frederico Hamman, MD  Encounter Date: 05/21/2015      PT End of Session - 05/21/15 1059    Visit Number 15   Date for PT Re-Evaluation 05/31/15   PT Start Time 1017   PT Stop Time 1058   PT Time Calculation (min) 41 min   Activity Tolerance Patient limited by pain;Other (comment)  self limits    Behavior During Therapy [facility] for tasks assessed/performed      Past Medical History  Diagnosis Date  . Diabetes mellitus   . Hypertension   . Leg pain 02/05/11    with walking  . Weight gain   . Shortness of breath     when lying flat  . Diminished eyesight   . Hearing difficulty   . Chronic kidney disease     Dia;ysis MWF  . Anemia   . Nervousness(799.21)   . COPD (chronic obstructive pulmonary disease)   . CHF (congestive heart failure)   . Posttraumatic stress disorder   . Allergic rhinitis   . Dyslipidemia   . Morbid obesity   . Diabetic peripheral neuropathy   . Gait disorder   . Obstructive sleep apnea   . Bilateral carpal tunnel syndrome   . Anxiety disorder   . Gastroparesis   . Gout   . Diabetic retinopathy   . Renal transplant, status post 04/05/2014  . Incomplete bladder emptying   . Urethral stricture   . Aortic insufficiency     a. Echo 7/16:  Mod LVH, EF 50-55%, mild to mod AI, severe LAE, PASP 57 mmHg (LV ID end diastolic 55.5 mm)    Past Surgical History  Procedure Laterality Date  . Kidney transplant  2009  . Cataract extraction w/ intraocular lens  implant, bilateral Bilateral   . Av fistula placement Bilateral   . Arteriovenous graft placement Bilateral "several"  . Cystoscopy w/ internal urethrotomy      There were no vitals filed for this visit.  Visit  Diagnosis:  Right shoulder pain  Difficulty walking  Bilateral low back pain without sciatica  Debility      Subjective Assessment - 05/21/15 1020    Subjective Pt reports that everything is so. Pt reports no new issues   Pertinent History ESRD, kidney transplant 2009, CVA during the surgery ? according to him   Currently in Pain? Yes   Pain Score 4    Pain Location Neck                         [facility] Adult PT Treatment/Exercise - 05/21/15 0001    Ambulation/Gait   Ambulation/Gait Yes   Ambulation/Gait Assistance 6: Modified independent (Device/Increase time)   Ambulation Distance (Feet) 60 Feet   Assistive device Rolling walker   Gait Pattern Step-through pattern   Ambulation Surface Level;Indoor   Knee/Hip Exercises: Aerobic   Tread Mill UBE Level 5 x 6 minutes   Knee/Hip Exercises: Machines for Strengthening   Cybex Knee Extension 20# 2 sets 15   Cybex Knee Flexion 25# 2 sets 15   Knee/Hip Exercises: Standing   Forward Step Up Step Height: 4"  1 rep per side pt self limiting    Shoulder Exercises: Seated  Other Seated Exercises seated overhead press with yellow weighted ball 15 2 sets, Lat pull downs with black Tband 10 reps 2 sets    Shoulder Exercises: Standing   Horizontal ABduction 15 reps;Theraband  2 sets    Theraband Level (Shoulder Horizontal ABduction) Level 1 (Yellow)   Flexion 10 reps;Weights   Theraband Level (Shoulder Flexion) --  1LB   Row Left;10 reps;Theraband   Theraband Level (Shoulder Row) Level 3 (Green)                  PT Short Term Goals - 04/08/15 1059    PT SHORT TERM GOAL #1   Title independent with initial HEP   Status Achieved           PT Long Term Goals - 05/21/15 1102    PT LONG TERM GOAL #1   Title decrease TUG time to 26 seconds   Status On-going   PT LONG TERM GOAL #2   Title decrease pain 50% when walking               Plan - 05/21/15 1100    Clinical Impression Statement Pt  tolerated treatment ok. Pt does self limit with unsupported standing activities. Pt able to perform step up with RW but reports he cant do it.    Pt will benefit from skilled therapeutic intervention in order to improve on the following deficits Abnormal gait;Cardiopulmonary status limiting activity;Decreased activity tolerance;Decreased balance;Decreased mobility;Decreased endurance;Decreased range of motion;Decreased strength;Difficulty walking;Pain   PT Frequency 2x / week   PT Duration 8 weeks   PT Treatment/Interventions Gait training;Functional mobility training;Therapeutic activities;Therapeutic exercise;Balance training;Neuromuscular re-education;Manual techniques;Patient/family education;Ultrasound;Iontophoresis 4mg /ml Dexamethasone   PT Next Visit Plan Continue to work on mobility        Problem List Patient Active Problem List   Diagnosis Date Noted  . Aortic insufficiency 04/12/2015  . Back pain   . Urine retention   . Back pain at L4-L5 level   . Weakness of both legs   . Anemia due to chronic kidney disease   . Low back pain   . Cervical radiculopathy   . Diabetic peripheral neuropathy 10/16/2014  . Renal transplant, status post 04/05/2014  . OSA (obstructive sleep apnea) 04/05/2014  . Severe obesity (BMI >= 40) 04/05/2014  . Benign fibroma of prostate 02/05/2014  . Abdominal apron 04/08/2012  . Chronic kidney failure 03/28/2012  . Benign essential HTN 03/24/2012  . H/O kidney transplant 03/24/2012  . Diabetic macular edema 01/15/2012  . Diabetes mellitus, type 2 01/15/2012  . Diabetic proliferative retinopathy 01/15/2012  . CHEST PAIN 06/03/2010  . IMMUNOCOMPROMISED 01/30/2010  . RECTAL PAIN 01/30/2010  . NAUSEA AND VOMITING 01/30/2010  . KIDNEY TRANSPLANTATION, HX OF 01/30/2010  . HYPERPARATHYROIDISM, SECONDARY 01/27/2010  . GOUT 01/27/2010  . Anxiety state 01/27/2010  . GASTROPARESIS 01/27/2010  . END STAGE RENAL DISEASE 01/27/2010  . FROZEN LEFT SHOULDER  01/27/2010  . INSOMNIA 01/27/2010  . HLD (hyperlipidemia) 05/25/2008  . OBSTRUCTIVE SLEEP APNEA 05/25/2008  . Essential hypertension 05/25/2008    PHYSICAL THERAPY DISCHARGE SUMMARY  Visits from Start of Care:    Plan: Patient agrees to discharge.  Patient goals were not met. Patient is being discharged due to not returning since the last visit.  ?????        Scot Jun, PTA 05/21/2015, 11:05 AM  [address] [place], [place], [address] Phone: [phone]   Fax:  [phone]

## 2015-05-23 ENCOUNTER — Ambulatory Visit: Payer: Medicare Other | Admitting: Physical Therapy

## 2015-05-23 DIAGNOSIS — Z794 Long term (current) use of insulin: Secondary | ICD-10-CM | POA: Diagnosis not present

## 2015-05-23 DIAGNOSIS — Z940 Kidney transplant status: Secondary | ICD-10-CM | POA: Diagnosis not present

## 2015-05-23 DIAGNOSIS — E785 Hyperlipidemia, unspecified: Secondary | ICD-10-CM | POA: Diagnosis not present

## 2015-05-23 DIAGNOSIS — Z79899 Other long term (current) drug therapy: Secondary | ICD-10-CM | POA: Diagnosis not present

## 2015-05-23 DIAGNOSIS — Z7982 Long term (current) use of aspirin: Secondary | ICD-10-CM | POA: Diagnosis not present

## 2015-05-23 DIAGNOSIS — E119 Type 2 diabetes mellitus without complications: Secondary | ICD-10-CM | POA: Diagnosis not present

## 2015-05-23 DIAGNOSIS — N189 Chronic kidney disease, unspecified: Secondary | ICD-10-CM | POA: Diagnosis not present

## 2015-05-23 DIAGNOSIS — E1121 Type 2 diabetes mellitus with diabetic nephropathy: Secondary | ICD-10-CM | POA: Diagnosis not present

## 2015-05-23 DIAGNOSIS — I129 Hypertensive chronic kidney disease with stage 1 through stage 4 chronic kidney disease, or unspecified chronic kidney disease: Secondary | ICD-10-CM | POA: Diagnosis not present

## 2015-05-23 DIAGNOSIS — Z6834 Body mass index (BMI) 34.0-34.9, adult: Secondary | ICD-10-CM | POA: Diagnosis not present

## 2015-05-23 DIAGNOSIS — Z5181 Encounter for therapeutic drug level monitoring: Secondary | ICD-10-CM | POA: Diagnosis not present

## 2015-05-23 DIAGNOSIS — T8611 Kidney transplant rejection: Secondary | ICD-10-CM | POA: Diagnosis not present

## 2015-05-24 ENCOUNTER — Telehealth: Payer: Self-pay | Admitting: Endocrinology

## 2015-05-24 ENCOUNTER — Other Ambulatory Visit: Payer: Self-pay | Admitting: *Deleted

## 2015-05-24 DIAGNOSIS — Z940 Kidney transplant status: Secondary | ICD-10-CM | POA: Diagnosis not present

## 2015-05-24 DIAGNOSIS — I129 Hypertensive chronic kidney disease with stage 1 through stage 4 chronic kidney disease, or unspecified chronic kidney disease: Secondary | ICD-10-CM | POA: Diagnosis not present

## 2015-05-24 DIAGNOSIS — E1121 Type 2 diabetes mellitus with diabetic nephropathy: Secondary | ICD-10-CM | POA: Diagnosis not present

## 2015-05-24 DIAGNOSIS — Z6834 Body mass index (BMI) 34.0-34.9, adult: Secondary | ICD-10-CM | POA: Diagnosis not present

## 2015-05-24 DIAGNOSIS — T8611 Kidney transplant rejection: Secondary | ICD-10-CM | POA: Diagnosis not present

## 2015-05-24 DIAGNOSIS — Z5181 Encounter for therapeutic drug level monitoring: Secondary | ICD-10-CM | POA: Diagnosis not present

## 2015-05-24 DIAGNOSIS — N189 Chronic kidney disease, unspecified: Secondary | ICD-10-CM | POA: Diagnosis not present

## 2015-05-24 DIAGNOSIS — E119 Type 2 diabetes mellitus without complications: Secondary | ICD-10-CM | POA: Diagnosis not present

## 2015-05-24 DIAGNOSIS — E785 Hyperlipidemia, unspecified: Secondary | ICD-10-CM | POA: Diagnosis not present

## 2015-05-24 MED ORDER — INSULIN REGULAR HUMAN (CONC) 500 UNIT/ML ~~LOC~~ SOPN: 10.0000 [IU] | PEN_INJECTOR | Freq: Once | SUBCUTANEOUS | Status: DC

## 2015-05-24 NOTE — Telephone Encounter (Signed)
error 

## 2015-05-27 DIAGNOSIS — Z940 Kidney transplant status: Secondary | ICD-10-CM | POA: Diagnosis not present

## 2015-05-27 DIAGNOSIS — Z4822 Encounter for aftercare following kidney transplant: Secondary | ICD-10-CM | POA: Diagnosis not present

## 2015-05-28 ENCOUNTER — Ambulatory Visit: Payer: Medicare Other | Admitting: Physical Therapy

## 2015-05-30 ENCOUNTER — Ambulatory Visit: Payer: Medicare Other | Admitting: Physical Therapy

## 2015-06-03 DIAGNOSIS — Z940 Kidney transplant status: Secondary | ICD-10-CM | POA: Diagnosis not present

## 2015-06-03 DIAGNOSIS — D8989 Other specified disorders involving the immune mechanism, not elsewhere classified: Secondary | ICD-10-CM | POA: Diagnosis not present

## 2015-06-03 DIAGNOSIS — Z9889 Other specified postprocedural states: Secondary | ICD-10-CM | POA: Diagnosis not present

## 2015-06-03 DIAGNOSIS — I10 Essential (primary) hypertension: Secondary | ICD-10-CM | POA: Diagnosis not present

## 2015-06-03 DIAGNOSIS — R110 Nausea: Secondary | ICD-10-CM | POA: Diagnosis not present

## 2015-06-03 DIAGNOSIS — Z79899 Other long term (current) drug therapy: Secondary | ICD-10-CM | POA: Diagnosis not present

## 2015-06-03 DIAGNOSIS — Z792 Long term (current) use of antibiotics: Secondary | ICD-10-CM | POA: Diagnosis not present

## 2015-06-03 DIAGNOSIS — Z4822 Encounter for aftercare following kidney transplant: Secondary | ICD-10-CM | POA: Diagnosis not present

## 2015-06-03 DIAGNOSIS — Z833 Family history of diabetes mellitus: Secondary | ICD-10-CM | POA: Diagnosis not present

## 2015-06-03 DIAGNOSIS — Z794 Long term (current) use of insulin: Secondary | ICD-10-CM | POA: Diagnosis not present

## 2015-06-03 DIAGNOSIS — Z87440 Personal history of urinary (tract) infections: Secondary | ICD-10-CM | POA: Diagnosis not present

## 2015-06-03 DIAGNOSIS — E785 Hyperlipidemia, unspecified: Secondary | ICD-10-CM | POA: Diagnosis not present

## 2015-06-03 DIAGNOSIS — Z8249 Family history of ischemic heart disease and other diseases of the circulatory system: Secondary | ICD-10-CM | POA: Diagnosis not present

## 2015-06-03 DIAGNOSIS — D899 Disorder involving the immune mechanism, unspecified: Secondary | ICD-10-CM | POA: Diagnosis not present

## 2015-06-03 DIAGNOSIS — Z7952 Long term (current) use of systemic steroids: Secondary | ICD-10-CM | POA: Diagnosis not present

## 2015-06-03 DIAGNOSIS — Z9849 Cataract extraction status, unspecified eye: Secondary | ICD-10-CM | POA: Diagnosis not present

## 2015-06-03 DIAGNOSIS — E1165 Type 2 diabetes mellitus with hyperglycemia: Secondary | ICD-10-CM | POA: Diagnosis not present

## 2015-06-03 DIAGNOSIS — F419 Anxiety disorder, unspecified: Secondary | ICD-10-CM | POA: Diagnosis not present

## 2015-06-03 DIAGNOSIS — Z8619 Personal history of other infectious and parasitic diseases: Secondary | ICD-10-CM | POA: Diagnosis not present

## 2015-06-03 DIAGNOSIS — R0602 Shortness of breath: Secondary | ICD-10-CM | POA: Diagnosis not present

## 2015-06-03 DIAGNOSIS — Z7982 Long term (current) use of aspirin: Secondary | ICD-10-CM | POA: Diagnosis not present

## 2015-06-04 ENCOUNTER — Ambulatory Visit: Payer: Medicare Other | Admitting: Physical Therapy

## 2015-06-06 ENCOUNTER — Ambulatory Visit: Payer: Medicare Other | Admitting: Physical Therapy

## 2015-06-10 DIAGNOSIS — T8611 Kidney transplant rejection: Secondary | ICD-10-CM | POA: Diagnosis not present

## 2015-06-10 DIAGNOSIS — Z79899 Other long term (current) drug therapy: Secondary | ICD-10-CM | POA: Diagnosis not present

## 2015-06-10 DIAGNOSIS — E118 Type 2 diabetes mellitus with unspecified complications: Secondary | ICD-10-CM | POA: Diagnosis not present

## 2015-06-10 DIAGNOSIS — Z940 Kidney transplant status: Secondary | ICD-10-CM | POA: Diagnosis not present

## 2015-06-10 DIAGNOSIS — I10 Essential (primary) hypertension: Secondary | ICD-10-CM | POA: Diagnosis not present

## 2015-06-19 ENCOUNTER — Other Ambulatory Visit: Payer: Self-pay

## 2015-06-19 ENCOUNTER — Other Ambulatory Visit (INDEPENDENT_AMBULATORY_CARE_PROVIDER_SITE_OTHER): Payer: Medicare Other

## 2015-06-19 DIAGNOSIS — D649 Anemia, unspecified: Secondary | ICD-10-CM | POA: Diagnosis not present

## 2015-06-19 DIAGNOSIS — N2581 Secondary hyperparathyroidism of renal origin: Secondary | ICD-10-CM | POA: Diagnosis not present

## 2015-06-19 DIAGNOSIS — E1165 Type 2 diabetes mellitus with hyperglycemia: Secondary | ICD-10-CM | POA: Diagnosis not present

## 2015-06-19 DIAGNOSIS — IMO0002 Reserved for concepts with insufficient information to code with codable children: Secondary | ICD-10-CM

## 2015-06-19 DIAGNOSIS — E119 Type 2 diabetes mellitus without complications: Secondary | ICD-10-CM | POA: Diagnosis not present

## 2015-06-19 DIAGNOSIS — I10 Essential (primary) hypertension: Secondary | ICD-10-CM | POA: Diagnosis not present

## 2015-06-19 DIAGNOSIS — M109 Gout, unspecified: Secondary | ICD-10-CM | POA: Diagnosis not present

## 2015-06-19 DIAGNOSIS — Z940 Kidney transplant status: Secondary | ICD-10-CM | POA: Diagnosis not present

## 2015-06-19 LAB — COMPREHENSIVE METABOLIC PANEL
ALT: 8 U/L (ref 0–53)
AST: 12 U/L (ref 0–37)
Albumin: 3.3 g/dL — ABNORMAL LOW (ref 3.5–5.2)
Alkaline Phosphatase: 76 U/L (ref 39–117)
BUN: 78 mg/dL — ABNORMAL HIGH (ref 6–23)
CO2: 29 mEq/L (ref 19–32)
Calcium: 7.8 mg/dL — ABNORMAL LOW (ref 8.4–10.5)
Chloride: 103 mEq/L (ref 96–112)
Creatinine, Ser: 3.18 mg/dL — ABNORMAL HIGH (ref 0.40–1.50)
GFR: 21.99 mL/min — ABNORMAL LOW (ref >60.00)
Glucose, Bld: 94 mg/dL (ref 70–99)
Potassium: 4.1 mEq/L (ref 3.5–5.1)
Sodium: 142 mEq/L (ref 135–145)
Total Bilirubin: 0.9 mg/dL (ref 0.2–1.2)
Total Protein: 6 g/dL (ref 6.0–8.3)

## 2015-06-19 LAB — MICROALBUMIN / CREATININE URINE RATIO
Creatinine,U: 66.1 mg/dL
Microalb Creat Ratio: 352.6 mg/g — ABNORMAL HIGH (ref 0.0–30.0)
Microalb, Ur: 232.9 mg/dL — ABNORMAL HIGH (ref 0.0–1.9)

## 2015-06-19 LAB — LIPID PANEL
Cholesterol: 143 mg/dL (ref 0–200)
HDL: 37.4 mg/dL — ABNORMAL LOW (ref >39.00)
LDL Cholesterol: 83 mg/dL (ref 0–99)
NonHDL: 106.04
Total CHOL/HDL Ratio: 4
Triglycerides: 115 mg/dL (ref 0.0–149.0)
VLDL: 23 mg/dL (ref 0.0–40.0)

## 2015-06-20 LAB — FRUCTOSAMINE: Fructosamine: 254 umol/L (ref 0–285)

## 2015-06-25 ENCOUNTER — Other Ambulatory Visit: Payer: Self-pay | Admitting: *Deleted

## 2015-06-25 ENCOUNTER — Encounter: Payer: Self-pay | Admitting: Endocrinology

## 2015-06-25 ENCOUNTER — Ambulatory Visit (INDEPENDENT_AMBULATORY_CARE_PROVIDER_SITE_OTHER): Payer: Medicare Other | Admitting: Endocrinology

## 2015-06-25 VITALS — BP 124/62 | HR 80 | Temp 98.2°F | Resp 16 | Ht 72.0 in | Wt 269.2 lb

## 2015-06-25 DIAGNOSIS — E1165 Type 2 diabetes mellitus with hyperglycemia: Secondary | ICD-10-CM

## 2015-06-25 DIAGNOSIS — I10 Essential (primary) hypertension: Secondary | ICD-10-CM | POA: Diagnosis not present

## 2015-06-25 DIAGNOSIS — IMO0002 Reserved for concepts with insufficient information to code with codable children: Secondary | ICD-10-CM

## 2015-06-25 MED ORDER — GLUCOSE BLOOD VI STRP: ORAL_STRIP | Status: DC

## 2015-06-25 NOTE — Progress Notes (Signed)
Patient ID: [name] [name], male   DOB: 08/25/1966, 49 y.o.   MRN: [id number]           Reason for Appointment: Follow-up for Type 2 Diabetes  History of Present Illness:          Date of diagnosis of type 2 diabetes mellitus : 1990       Background history:  He has been taking insulin since about 1999, previously was on unknown oral agents He had good control with insulin initially but A1c was much higher in 2008 and subsequently has had required larger doses of insulin He has been on Lantus for the last few years along with Novolog,followed by an endocrinologist since 2008 His A1c was 6.3 in 04/2014   Prior to his consultation he was on a regimen of Novolog , 50 units 5 times a day with each meal and snack along with 90 3 times a day of Lantus   Recent history:   INSULIN regimen is :U-500 insulin with KwikPen 10 before bfst, 0-25 before lunch and 20 before dinner, take 30 min before  Sliding scale use for high sugars: Sugar 150-199 take 2 units, 200-250= 4 units and over 250 take 6 units  TOUJEO 90 units twice daily   With switching to U-500 insulin and twice a day Toujeo his blood sugars have been overall better and he has overall much lower insulin requirement especially around mealtimes; with this he also did cut back on his overall food intake and snacks  Although his A1c in the last couple of years has been near normal this may be inaccurate because of his renal dysfunction His A1c is still in the upper normal range and slightly better this time Fructosamine is in the normal range although he has a low albumin level  Recently because of his kidney rejection he has had intermittent steroids causing his sugars to be high at times.  He has now gone down to 5 mg prednisone in the last week  Current blood sugar patterns and problems identified:  He has been doing blood sugar somewhat sporadically and mostly overnight  He sometimes tends to have significantly high readings in the  afternoon and evenings; because of his lower readings in the mornings and smaller breakfast meals recently has taken only 10 units of U-500  Also not checking blood sugars in the afternoon as much  He is taking somewhat arbitrary doses of insulin for her high sugars and yesterday with 20 units for blood sugar of over 380.mildly hypoglycemic  Does have periodic low normal readings in the mornings His average blood sugars are around 150 overnight, 220 late in the evening  Oral hypoglycemic drugs the patient is taking are: none      Compliance with the medical regimen: usually good Hypoglycemia: As above  Glucose monitoring:  done  1-2 times a day         Glucometer: Aviva      Blood Glucose readings by  Monitor download:  Mean values apply above for all meters except median for One Touch  PRE-MEAL Fasting Lunch Dinner Bedtime Overall  Glucose range:  64-363  64- 333  64- 313  124- 432   Mean/median:      188     Self-care: The diet that the patient has been following is: tries to limit high-fat meals, usually eating  2-3 meals a day.     Typical meal intake: Breakfast is grits with pecans, olive oil. Dinner 6 pm  Dietician visit, most recent:2013               Exercise:  unable to do any  Weight history:  Wt Readings from Last 3 Encounters:  06/25/15 269 lb 3.2 oz (122.108 kg)  05/15/15 256 lb (116.121 kg)  04/24/15 265 lb 9.6 oz (120.475 kg)    Glycemic control:   Lab Results  Component Value Date   HGBA1C 5.5 04/17/2015   HGBA1C 5.9* 10/24/2014   HGBA1C * 12/31/2009    8.7 (NOTE) The ADA recommends the following therapeutic goal for glycemic control related to Hgb A1c measurement: Goal of therapy: <6.5 Hgb A1c  Reference: American Diabetes Association: Clinical Practice Recommendations 2010, Diabetes Care, 2010, 33: (Suppl  1).   Lab Results  Component Value Date   MICROALBUR 232.9* 06/19/2015   LDLCALC 83 06/19/2015   CREATININE 3.18* 06/19/2015          Medication List       This list is accurate as of: 06/25/15 11:25 AM.  Always use your most recent med list.               ACCU-CHEK FASTCLIX LANCETS Misc  Use to check blood sugar 4 times per day dx code E11.9     aspirin 325 MG tablet  Take 325 mg by mouth daily.     calcitRIOL 0.5 MCG capsule  Commonly known as:  ROCALTROL  Take 0.5 mcg by mouth at bedtime.     clonazePAM 0.5 MG tablet  Commonly known as:  KLONOPIN  Take 1 mg by mouth.     furosemide 80 MG tablet  Commonly known as:  LASIX  Take 2 tablets by mouth 2 (two) times daily.     glucose blood test strip  Commonly known as:  ACCU-CHEK SMARTVIEW  Use as instructed to check blood sugar 4 times per day dx code E11.9     Insulin Glargine 300 UNIT/ML Sopn  Commonly known as:  TOUJEO SOLOSTAR  Inject 90 Units into the skin 2 (two) times daily.     Insulin Pen Needle 32G X 4 MM Misc  Commonly known as:  BD PEN NEEDLE NANO U/F  Use 5 per day     insulin regular human CONCENTRATED 500 UNIT/ML injection  Commonly known as:  HUMULIN R U-500 KWIKPEN  Inject 0.02 mLs (10 Units total) into the skin once. U-500 insulin 10 before bfst, 25 before lunch and 20 before dinner, take 30 min before Sliding scale use for high sugars:  Sugar 150-199 take 2 units, 200-250= 4 units and over 250 take 6 units     mycophenolate 250 MG capsule  Commonly known as:  CELLCEPT  Take 750 mg by mouth 2 (two) times daily.     omeprazole 20 MG capsule  Commonly known as:  PRILOSEC  Take 20 mg by mouth daily.     potassium chloride SA 20 MEQ tablet  Commonly known as:  K-DUR,KLOR-CON  Take 2 tablets by mouth 2 (two) times daily.     predniSONE 5 MG tablet  Commonly known as:  DELTASONE  Take 5 mg by mouth daily.     pregabalin 100 MG capsule  Commonly known as:  LYRICA  Take by mouth.     rosuvastatin 20 MG tablet  Commonly known as:  CRESTOR  Take 1 tablet (20 mg total) by mouth at bedtime.     tacrolimus 0.5 MG  capsule  Commonly known as:  PROGRAF  Take 1  mg by mouth 2 (two) times daily.     ULORIC 80 MG Tabs  Generic drug:  Febuxostat  Take 80 mg by mouth daily.        Allergies:  Allergies  Allergen Reactions  . Penicillins Rash and Other (See Comments)    fever  . Pork-Derived Products Anaphylaxis and Other (See Comments)    Fever   . Ace Inhibitors Cough    Past Medical History  Diagnosis Date  . Diabetes mellitus   . Hypertension   . Leg pain 02/05/11    with walking  . Weight gain   . Shortness of breath     when lying flat  . Diminished eyesight   . Hearing difficulty   . Chronic kidney disease     Dia;ysis MWF  . Anemia   . Nervousness(799.21)   . COPD (chronic obstructive pulmonary disease)   . CHF (congestive heart failure)   . Posttraumatic stress disorder   . Allergic rhinitis   . Dyslipidemia   . Morbid obesity   . Diabetic peripheral neuropathy   . Gait disorder   . Obstructive sleep apnea   . Bilateral carpal tunnel syndrome   . Anxiety disorder   . Gastroparesis   . Gout   . Diabetic retinopathy   . Renal transplant, status post 04/05/2014  . Incomplete bladder emptying   . Urethral stricture   . Aortic insufficiency     a. Echo 7/16:  Mod LVH, EF 50-55%, mild to mod AI, severe LAE, PASP 57 mmHg (LV ID end diastolic [id number] mm)    Past Surgical History  Procedure Laterality Date  . Kidney transplant  2009  . Cataract extraction w/ intraocular lens  implant, bilateral Bilateral   . Av fistula placement Bilateral   . Arteriovenous graft placement Bilateral "several"  . Cystoscopy w/ internal urethrotomy      Family History  Problem Relation Age of Onset  . Hypertension Mother   . Diabetes Father     Social History:  reports that he has never smoked. He has never used smokeless tobacco. He reports that he does not drink alcohol or use illicit drugs.    Review of Systems    Lipid history: He is on 20 mg Crestor Lipids checked by  nephrologist show cholesterol 161, triglycerides 61, HDL 39 and LDL 110 in 03/2015    Lab Results  Component Value Date   CHOL 143 06/19/2015   HDL 37.40* 06/19/2015   LDLCALC 83 06/19/2015   TRIG 115.0 06/19/2015   CHOLHDL 4 06/19/2015            Eyes: Most recent eye exam was 6/15  Hypertension: His blood pressure is now better controlled with increased dose of clonidine and is followed by nephrologist   RENAL: He has had a transplant in 2009 and last creatinine was 2.5 last month nephrologist but appears to be higher last week He apparently is going to get a biopsy on his transplanted kidney  Neurological: Has has had some decreased sensation in his feet, no burning.  Sometimes gets sharp pains in his feet and toes, no tingling He has been given Lyrica by his neurologist and takes 50 mg at night with only some relief Previously had relief with gabapentin but he thinks he had urinary incontinence with this.  Not clear if he had any benefit from Cymbalta He has had marked decrease in his balance and also some weakness in his legs, using a power scooter  Last foot exam was in 03/2015: Absent monofilament sensation in his feet and toes and extending up to the middle of the lower leg on the left and up to the ankle on the right  LABS:  Appointment on 06/19/2015  Component Date Value Ref Range Status  . Sodium 06/19/2015 142  135 - 145 mEq/L Final  . Potassium 06/19/2015 4.1  3.5 - 5.1 mEq/L Final  . Chloride 06/19/2015 103  96 - 112 mEq/L Final  . CO2 06/19/2015 29  19 - 32 mEq/L Final  . Glucose, Bld 06/19/2015 94  70 - 99 mg/dL Final  . BUN 06/19/2015 78* 6 - 23 mg/dL Final  . Creatinine, Ser 06/19/2015 3.18* 0.40 - 1.50 mg/dL Final  . Total Bilirubin 06/19/2015 0.9  0.2 - 1.2 mg/dL Final  . Alkaline Phosphatase 06/19/2015 76  39 - 117 U/L Final  . AST 06/19/2015 12  0 - 37 U/L Final  . ALT 06/19/2015 8  0 - 53 U/L Final  . Total Protein 06/19/2015 6.0  6.0 - 8.3 g/dL Final    . Albumin 06/19/2015 3.3* 3.5 - 5.2 g/dL Final  . Calcium 06/19/2015 7.8* 8.4 - 10.5 mg/dL Final  . GFR 06/19/2015 21.99* >60.00 mL/min Final  . Fructosamine 06/19/2015 254  0 - 285 umol/L Final   Comment: Published reference interval for apparently healthy subjects between age 22 and 5 is 80 - 285 umol/L and in a poorly controlled diabetic population is 228 - 563 umol/L with a mean of 396 umol/L.   Marland Kitchen Cholesterol 06/19/2015 143  0 - 200 mg/dL Final   ATP III Classification       Desirable:  < 200 mg/dL               Borderline High:  200 - 239 mg/dL          High:  > = 240 mg/dL  . Triglycerides 06/19/2015 115.0  0.0 - 149.0 mg/dL Final   Normal:  <150 mg/dLBorderline High:  150 - 199 mg/dL  . HDL 06/19/2015 37.40* >39.00 mg/dL Final  . VLDL 06/19/2015 23.0  0.0 - 40.0 mg/dL Final  . LDL Cholesterol 06/19/2015 83  0 - 99 mg/dL Final  . Total CHOL/HDL Ratio 06/19/2015 4   Final                  Men          Women1/2 Average Risk     3.4          3.3Average Risk          5.0          4.42X Average Risk          9.6          7.13X Average Risk          15.0          11.0                      . NonHDL 06/19/2015 106.04   Final   NOTE:  Non-HDL goal should be 30 mg/dL higher than patient's LDL goal (i.e. LDL goal of < 70 mg/dL, would have non-HDL goal of < 100 mg/dL)  . Microalb, Ur 06/19/2015 232.9* 0.0 - 1.9 mg/dL Final  . Creatinine,U 06/19/2015 66.1   Final  . Microalb Creat Ratio 06/19/2015 352.6* 0.0 - 30.0 mg/g Final    Physical Examination:  BP 124/62 mmHg  Pulse  80  Temp(Src) 98.2 F (36.8 C)  Resp 16  Ht 6' (1.829 m)  Wt 269 lb 3.2 oz (122.108 kg)  BMI 36.50 kg/m2  SpO2 96%        ASSESSMENT:  Diabetes type 2, long-standing with obesity See history of present illness for detailed discussion of his current management, blood sugar patterns and problems identified.  Although he is generally having fairly good readings fasting he has had sporadic high readings later  in the day and at times as high as 500 with steroids Currently he is not checking his blood sugars consistently during the day and also not taking insulin consistently with meals He does appear to be still requiring more insulin to cover his afternoon rise in blood sugar with prednisone even though he is on 5 mg only He has reduced food intake recently because of his renal dysfunction and still requires about the same amount of mealtime coverage.  Currently difficult to assess any consistent glucose pattern because of his erratic mealtimes, making times and also intermittent use of prednisone His basal insulin appears to be well regulated although until last week was having low normal fasting readings A1c and fructosamine appear to be a good range  HYPERTENSION: This is now better controlled with increased dose of clonidine and is followed by nephrologist   PLAN:   Start checking blood sugars more consistently at least 3-4 times a day including after meals and mid day even if he is not eating a meal   He to take 15 units of insulin in the morning at least when he is on prednisone to cover his daytime hyperglycemia   Reduce  TOUJEO insulin by 5-10 units if fasting readings start getting low   Make sure he takes insulin 30 minutes before eating  Balanced meals with some protein at each meal.  Avoid taking more than 15 units insulin for correction doses  Consider using One Touch monitor where he can mark his postprandial readings since he is eating erratically and not clear which readings have been postprandial  Patient Instructions  Take 15 units of U-500 insulin before breakfast while on prednisone  Check blood sugars on waking up .Marland Kitchen5-6  .Marland Kitchen times a week Also check blood sugars about 2 hours after a meal and do this after different meals by rotation  Recommended blood sugar levels on waking up is 90-130 and about 2 hours after meal is 140-180 Please bring blood sugar monitor to each  visit.     Counseling time on subjects discussed above is over 50% of today's 25 minute visit  Tamas Suen 06/25/2015, 11:25 AM   Note: This office note was prepared with Estate agent. Any transcriptional errors that result from this process are unintentional.

## 2015-06-25 NOTE — Patient Instructions (Signed)
Take 15 units of U-500 insulin before breakfast while on prednisone  Check blood sugars on waking up .Marland Kitchen5-6  .Marland Kitchen times a week Also check blood sugars about 2 hours after a meal and do this after different meals by rotation  Recommended blood sugar levels on waking up is 90-130 and about 2 hours after meal is 140-180 Please bring blood sugar monitor to each visit.

## 2015-06-26 DIAGNOSIS — R809 Proteinuria, unspecified: Secondary | ICD-10-CM | POA: Diagnosis not present

## 2015-06-26 DIAGNOSIS — E119 Type 2 diabetes mellitus without complications: Secondary | ICD-10-CM | POA: Diagnosis not present

## 2015-06-26 DIAGNOSIS — Z79899 Other long term (current) drug therapy: Secondary | ICD-10-CM | POA: Diagnosis not present

## 2015-06-26 DIAGNOSIS — D631 Anemia in chronic kidney disease: Secondary | ICD-10-CM | POA: Diagnosis not present

## 2015-06-26 DIAGNOSIS — R748 Abnormal levels of other serum enzymes: Secondary | ICD-10-CM | POA: Diagnosis not present

## 2015-06-26 DIAGNOSIS — Z7982 Long term (current) use of aspirin: Secondary | ICD-10-CM | POA: Diagnosis not present

## 2015-06-26 DIAGNOSIS — T8611 Kidney transplant rejection: Secondary | ICD-10-CM | POA: Diagnosis not present

## 2015-06-26 DIAGNOSIS — D649 Anemia, unspecified: Secondary | ICD-10-CM | POA: Diagnosis not present

## 2015-06-26 DIAGNOSIS — Z940 Kidney transplant status: Secondary | ICD-10-CM | POA: Diagnosis not present

## 2015-06-26 DIAGNOSIS — E11319 Type 2 diabetes mellitus with unspecified diabetic retinopathy without macular edema: Secondary | ICD-10-CM | POA: Diagnosis not present

## 2015-06-26 DIAGNOSIS — I10 Essential (primary) hypertension: Secondary | ICD-10-CM | POA: Diagnosis not present

## 2015-07-03 DIAGNOSIS — Z792 Long term (current) use of antibiotics: Secondary | ICD-10-CM | POA: Diagnosis not present

## 2015-07-03 DIAGNOSIS — E1122 Type 2 diabetes mellitus with diabetic chronic kidney disease: Secondary | ICD-10-CM | POA: Diagnosis not present

## 2015-07-03 DIAGNOSIS — I120 Hypertensive chronic kidney disease with stage 5 chronic kidney disease or end stage renal disease: Secondary | ICD-10-CM | POA: Diagnosis not present

## 2015-07-03 DIAGNOSIS — R110 Nausea: Secondary | ICD-10-CM | POA: Diagnosis not present

## 2015-07-03 DIAGNOSIS — Z4822 Encounter for aftercare following kidney transplant: Secondary | ICD-10-CM | POA: Diagnosis not present

## 2015-07-03 DIAGNOSIS — I10 Essential (primary) hypertension: Secondary | ICD-10-CM | POA: Diagnosis not present

## 2015-07-03 DIAGNOSIS — Z79899 Other long term (current) drug therapy: Secondary | ICD-10-CM | POA: Diagnosis not present

## 2015-07-03 DIAGNOSIS — Z940 Kidney transplant status: Secondary | ICD-10-CM | POA: Diagnosis not present

## 2015-07-03 DIAGNOSIS — N186 End stage renal disease: Secondary | ICD-10-CM | POA: Diagnosis not present

## 2015-07-03 DIAGNOSIS — Z880 Allergy status to penicillin: Secondary | ICD-10-CM | POA: Diagnosis not present

## 2015-07-03 DIAGNOSIS — Z7982 Long term (current) use of aspirin: Secondary | ICD-10-CM | POA: Diagnosis not present

## 2015-07-03 DIAGNOSIS — R809 Proteinuria, unspecified: Secondary | ICD-10-CM | POA: Diagnosis not present

## 2015-07-03 DIAGNOSIS — Z794 Long term (current) use of insulin: Secondary | ICD-10-CM | POA: Diagnosis not present

## 2015-07-03 DIAGNOSIS — E119 Type 2 diabetes mellitus without complications: Secondary | ICD-10-CM | POA: Diagnosis not present

## 2015-07-19 DIAGNOSIS — Z940 Kidney transplant status: Secondary | ICD-10-CM | POA: Diagnosis not present

## 2015-08-02 ENCOUNTER — Encounter: Payer: Self-pay | Admitting: Vascular Surgery

## 2015-08-02 DIAGNOSIS — Z940 Kidney transplant status: Secondary | ICD-10-CM | POA: Diagnosis not present

## 2015-08-05 ENCOUNTER — Other Ambulatory Visit: Payer: Self-pay | Admitting: *Deleted

## 2015-08-05 DIAGNOSIS — N183 Chronic kidney disease, stage 3 unspecified: Secondary | ICD-10-CM

## 2015-08-05 DIAGNOSIS — Z01810 Encounter for preprocedural cardiovascular examination: Secondary | ICD-10-CM

## 2015-08-14 DIAGNOSIS — Z940 Kidney transplant status: Secondary | ICD-10-CM | POA: Diagnosis not present

## 2015-08-20 DIAGNOSIS — Z794 Long term (current) use of insulin: Secondary | ICD-10-CM | POA: Diagnosis not present

## 2015-08-20 DIAGNOSIS — I10 Essential (primary) hypertension: Secondary | ICD-10-CM | POA: Diagnosis not present

## 2015-08-20 DIAGNOSIS — E785 Hyperlipidemia, unspecified: Secondary | ICD-10-CM | POA: Diagnosis not present

## 2015-08-20 DIAGNOSIS — G4733 Obstructive sleep apnea (adult) (pediatric): Secondary | ICD-10-CM | POA: Diagnosis not present

## 2015-08-20 DIAGNOSIS — Z940 Kidney transplant status: Secondary | ICD-10-CM | POA: Diagnosis not present

## 2015-08-20 DIAGNOSIS — E119 Type 2 diabetes mellitus without complications: Secondary | ICD-10-CM | POA: Diagnosis not present

## 2015-08-20 DIAGNOSIS — M109 Gout, unspecified: Secondary | ICD-10-CM | POA: Diagnosis not present

## 2015-08-20 DIAGNOSIS — N2581 Secondary hyperparathyroidism of renal origin: Secondary | ICD-10-CM | POA: Diagnosis not present

## 2015-08-20 DIAGNOSIS — R300 Dysuria: Secondary | ICD-10-CM | POA: Diagnosis not present

## 2015-08-21 ENCOUNTER — Other Ambulatory Visit (INDEPENDENT_AMBULATORY_CARE_PROVIDER_SITE_OTHER): Payer: Medicare Other

## 2015-08-21 DIAGNOSIS — E1165 Type 2 diabetes mellitus with hyperglycemia: Secondary | ICD-10-CM | POA: Diagnosis not present

## 2015-08-21 DIAGNOSIS — IMO0002 Reserved for concepts with insufficient information to code with codable children: Secondary | ICD-10-CM

## 2015-08-21 LAB — BASIC METABOLIC PANEL
BUN: 73 mg/dL — ABNORMAL HIGH (ref 6–23)
CO2: 26 mEq/L (ref 19–32)
Calcium: 7.1 mg/dL — ABNORMAL LOW (ref 8.4–10.5)
Chloride: 104 mEq/L (ref 96–112)
Creatinine, Ser: 3.27 mg/dL — ABNORMAL HIGH (ref 0.40–1.50)
GFR: 21.28 mL/min — ABNORMAL LOW (ref >60.00)
Glucose, Bld: 65 mg/dL — ABNORMAL LOW (ref 70–99)
Potassium: 4 mEq/L (ref 3.5–5.1)
Sodium: 143 mEq/L (ref 135–145)

## 2015-08-22 LAB — FRUCTOSAMINE: Fructosamine: 223 umol/L (ref 0–285)

## 2015-08-26 ENCOUNTER — Other Ambulatory Visit: Payer: Self-pay | Admitting: *Deleted

## 2015-08-26 ENCOUNTER — Ambulatory Visit (INDEPENDENT_AMBULATORY_CARE_PROVIDER_SITE_OTHER): Payer: Medicare Other | Admitting: Endocrinology

## 2015-08-26 ENCOUNTER — Encounter: Payer: Self-pay | Admitting: Endocrinology

## 2015-08-26 VITALS — BP 134/48 | HR 78 | Temp 98.5°F | Resp 14 | Ht 72.0 in | Wt 274.2 lb

## 2015-08-26 DIAGNOSIS — Z794 Long term (current) use of insulin: Secondary | ICD-10-CM | POA: Diagnosis not present

## 2015-08-26 DIAGNOSIS — E1165 Type 2 diabetes mellitus with hyperglycemia: Secondary | ICD-10-CM | POA: Diagnosis not present

## 2015-08-26 DIAGNOSIS — E119 Type 2 diabetes mellitus without complications: Secondary | ICD-10-CM

## 2015-08-26 LAB — POCT GLYCOSYLATED HEMOGLOBIN (HGB A1C): Hemoglobin A1C: 6.5

## 2015-08-26 MED ORDER — INSULIN REGULAR HUMAN (CONC) 500 UNIT/ML ~~LOC~~ SOPN: 10.0000 [IU] | PEN_INJECTOR | Freq: Once | SUBCUTANEOUS | Status: DC

## 2015-08-26 MED ORDER — INSULIN GLARGINE 300 UNIT/ML ~~LOC~~ SOPN: 90.0000 [IU] | PEN_INJECTOR | Freq: Two times a day (BID) | SUBCUTANEOUS | Status: DC

## 2015-08-26 NOTE — Patient Instructions (Signed)
Increase U-500 to 30 before lunch and take daily   Toujeo to 80 units twice  Daily

## 2015-08-26 NOTE — Progress Notes (Signed)
Patient ID: [name] [name], male   DOB: [birth year], 49 y.o.   MRN: [id number]           Reason for Appointment: Follow-up for Type 2 Diabetes  History of Present Illness:          Date of diagnosis of type 2 diabetes mellitus : 1990       Background history:  He has been taking insulin since about 1999, previously was on unknown oral agents He had good control with insulin initially but A1c was much higher in 2008 and subsequently has had required larger doses of insulin He has been on Lantus for the last few years along with Novolog,followed by an endocrinologist since 2008 His A1c was 6.3 in 04/2014   Prior to his consultation he was on a regimen of Novolog , 50 units 5 times a day with each meal and snack along with 90 3 times a day of Lantus   Recent history:   INSULIN regimen is :U-500 insulin with KwikPen 10 before bfst, 0-25 before lunch and 20 before dinner, take 30 min before   Sliding scale use for high sugars: Sugar 150-199 take 2 units, 200-250= 4 units and over 250 take 6 units  TOUJEO 90 units twice daily  With switching to U-500 insulin and twice a day Toujeo his blood sugars have been overall better and he has overall much lower insulin requirement especially at mealtimes Although his A1c in the last couple of years has been near normal this may be inaccurate because of his renal dysfunction Blood sugars were higher a few weeks ago because of getting intermittent steroids Currently getting 5 mg prednisone His A1c is still in the upper normal range and slightly higher as expected Fructosamine is in the normal range and appears better  Current blood sugar patterns and problems identified:  He did not bring his monitor today and blood sugars reviewed from his record for the last week,do not have readings since last Thursday  He has excellent fasting reading usually in a couple of times low-normal in the 60s.  No overnight hypoglycemia  Blood sugars are also fairly  good at lunchtime, usually has a small breakfast  His readings are usually higher in the afternoon, partly because of missing his insulin before he goes for lunch  He has relatively better readings before and after supper although there is some variability probably based on his carbohydrate intake He has gained a little weight  Oral hypoglycemic drugs the patient is taking are: none      Compliance with the medical regimen: usually good Hypoglycemia: As above  Glucose monitoring:  done  4-5 times a day         Glucometer: Aviva      Blood Glucose readings by home diary  Mean values apply above for all meters except median for One Touch  PRE-MEAL Fasting Lunch Dinner Bedtime Overall  Glucose range: 67-128 98-120 86-300 98-198   Mean/median:         Self-care: The diet that the patient has been following is: tries to limit high-fat meals, usually eating  2-3 meals a day.     Typical meal intake: Breakfast is grits with pecans, olive oil. Dinner 6 pm               Dietician visit, most recent:2013               Exercise:  unable to do any  Weight history:  Wt Readings  from Last 3 Encounters:  08/26/15 274 lb 3.2 oz (124.376 kg)  06/25/15 269 lb 3.2 oz (122.108 kg)  05/15/15 256 lb (116.121 kg)    Glycemic control:   Lab Results  Component Value Date   HGBA1C 6.5 08/26/2015   HGBA1C 5.5 04/17/2015   HGBA1C 5.9* 10/24/2014   Lab Results  Component Value Date   MICROALBUR 232.9* 06/19/2015   LDLCALC 83 06/19/2015   CREATININE 3.27* 08/21/2015         Medication List       This list is accurate as of: 08/26/15  3:36 PM.  Always use your most recent med list.               ACCU-CHEK FASTCLIX LANCETS Misc  Use to check blood sugar 4 times per day dx code E11.9     aspirin 325 MG tablet  Take 81 mg by mouth daily.     calcitRIOL 0.5 MCG capsule  Commonly known as:  ROCALTROL  Take 0.25 mcg by mouth daily. Takes 3 tablets daily.     clonazePAM 0.5 MG  tablet  Commonly known as:  KLONOPIN  Take 1 mg by mouth.     cloNIDine 0.3 MG tablet  Commonly known as:  CATAPRES  Take 0.3 mg by mouth 2 (two) times daily.     fluconazole 50 MG tablet  Commonly known as:  DIFLUCAN  Take 50 mg by mouth daily.     furosemide 80 MG tablet  Commonly known as:  LASIX  Take 2 tablets by mouth 2 (two) times daily.     glucose blood test strip  Commonly known as:  ACCU-CHEK SMARTVIEW  Use as instructed to check blood sugar 4 times per day dx code E11.9     Insulin Glargine 300 UNIT/ML Sopn  Commonly known as:  TOUJEO SOLOSTAR  Inject 90 Units into the skin 2 (two) times daily.     Insulin Pen Needle 32G X 4 MM Misc  Commonly known as:  BD PEN NEEDLE NANO U/F  Use 5 per day     insulin regular human CONCENTRATED 500 UNIT/ML kwikpen  Commonly known as:  HUMULIN R U-500 KWIKPEN  Inject 10 Units into the skin once. U-500 insulin 10 before bfst, 25 before lunch and 20 before dinner, take 30 min before Sliding scale use for high sugars:  Sugar 150-199 take 2 units, 200-250= 4 units and over 250 take 6 units     mycophenolate 250 MG capsule  Commonly known as:  CELLCEPT  Take 750 mg by mouth 2 (two) times daily.     omeprazole 20 MG capsule  Commonly known as:  PRILOSEC  Take 20 mg by mouth daily.     potassium chloride SA 20 MEQ tablet  Commonly known as:  K-DUR,KLOR-CON  Take 2 tablets by mouth 2 (two) times daily.     predniSONE 5 MG tablet  Commonly known as:  DELTASONE  Take 5 mg by mouth daily.     pregabalin 100 MG capsule  Commonly known as:  LYRICA  Take by mouth.     rosuvastatin 20 MG tablet  Commonly known as:  CRESTOR  Take 1 tablet (20 mg total) by mouth at bedtime.     sulfamethoxazole-trimethoprim 400-80 MG tablet  Commonly known as:  BACTRIM,SEPTRA  Take 1 tablet by mouth 3 (three) times a week. Takes 1 tablet M/W/F.     tacrolimus 0.5 MG capsule  Commonly known as:  PROGRAF  Take 2.5 mg by mouth 2 (two) times  daily. Takes 5 capsules 2 times a day     tadalafil 5 MG tablet  Commonly known as:  CIALIS  Take 5 mg by mouth daily as needed for erectile dysfunction.     ULORIC 80 MG Tabs  Generic drug:  Febuxostat  Take 80 mg by mouth daily.     valGANciclovir 450 MG tablet  Commonly known as:  VALCYTE  Take 450 mg by mouth. Takes 1 tablet  M/W/F.        Allergies:  Allergies  Allergen Reactions  . Penicillins Rash and Other (See Comments)    fever  . Pork-Derived Products Anaphylaxis and Other (See Comments)    Fever   . Ace Inhibitors Cough    Past Medical History  Diagnosis Date  . Diabetes mellitus   . Hypertension   . Leg pain 02/05/11    with walking  . Weight gain   . Shortness of breath     when lying flat  . Diminished eyesight   . Hearing difficulty   . Chronic kidney disease     Dia;ysis MWF  . Anemia   . Nervousness(799.21)   . COPD (chronic obstructive pulmonary disease) ([place])   . CHF (congestive heart failure) ([place])   . Posttraumatic stress disorder   . Allergic rhinitis   . Dyslipidemia   . Morbid obesity ([place])   . Diabetic peripheral neuropathy ([place])   . Gait disorder   . Obstructive sleep apnea   . Bilateral carpal tunnel syndrome   . Anxiety disorder   . Gastroparesis   . Gout   . Diabetic retinopathy ([place])   . Renal transplant, status post 04/05/2014  . Incomplete bladder emptying   . Urethral stricture   . Aortic insufficiency     a. Echo 7/16:  Mod LVH, EF 50-55%, mild to mod AI, severe LAE, PASP 57 mmHg (LV ID end diastolic [id number] mm)  . Hyperlipidemia   . Osteomyelitis ([place])   . Endocarditis   . Chronic renal allograft nephropathy   . Vitamin D deficiency   . Secondary hyperparathyroidism ([place])   . Rotator cuff disorder   . Discitis of lumbar region     resolved    Past Surgical History  Procedure Laterality Date  . Kidney transplant  2009  . Cataract extraction w/ intraocular lens  implant, bilateral Bilateral   . Av fistula  placement Bilateral   . Arteriovenous graft placement Bilateral "several"  . Cystoscopy w/ internal urethrotomy      Family History  Problem Relation Age of Onset  . Hypertension Mother   . Diabetes Father     Social History:  reports that he has never smoked. He has never used smokeless tobacco. He reports that he does not drink alcohol or use illicit drugs.    Review of Systems    ROS   Lipid history: He is on 20 mg Crestor    Lab Results  Component Value Date   CHOL 143 06/19/2015   HDL 37.40* 06/19/2015   LDLCALC 83 06/19/2015   TRIG 115.0 06/19/2015   CHOLHDL 4 06/19/2015            Eyes: Most recent eye exam was 6/15  Hypertension: His blood pressure is now controlled with increased dose of clonidine and is followed by nephrologist   RENAL: He has had a transplant in 2009 and last creatinine was 2.5 last month nephrologist but appears to be  higher and he is going to get a fistula Highest creatinine was 3.6 recently   Neurological: Has has had some decreased sensation in his feet, no burning.  Sometimes gets sharp pains in his feet and toes, no tingling He has been given Lyrica by his neurologist and takes 50 mg at night with only some relief  He has had marked decrease in his balance and also some weakness in his legs, using a power scooter  Last foot exam was in 03/2015: Absent monofilament sensation in his feet and toes and extending up to the middle of the lower leg on the left and up to the ankle on the right  LABS:  Office Visit on 08/26/2015  Component Date Value Ref Range Status  . Hemoglobin A1C 08/26/2015 6.5   Final  Appointment on 08/21/2015  Component Date Value Ref Range Status  . Sodium 08/21/2015 143  135 - 145 mEq/L Final  . Potassium 08/21/2015 4.0  3.5 - 5.1 mEq/L Final  . Chloride 08/21/2015 104  96 - 112 mEq/L Final  . CO2 08/21/2015 26  19 - 32 mEq/L Final  . Glucose, Bld 08/21/2015 65* 70 - 99 mg/dL Final  . BUN 08/21/2015 73* 6 -  23 mg/dL Final  . Creatinine, Ser 08/21/2015 3.27* 0.40 - 1.50 mg/dL Final  . Calcium 08/21/2015 7.1* 8.4 - 10.5 mg/dL Final  . GFR 08/21/2015 21.28* >60.00 mL/min Final  . Fructosamine 08/21/2015 223  0 - 285 umol/L Final   Comment: Published reference interval for apparently healthy subjects between age 89 and 23 is 22 - 285 umol/L and in a poorly controlled diabetic population is 228 - 563 umol/L with a mean of 396 umol/L.     Physical Examination:  BP 134/48 mmHg  Pulse 78  Temp(Src) 98.5 F (36.9 C)  Resp 14  Ht 6' (1.829 m)  Wt 274 lb 3.2 oz (124.376 kg)  BMI 37.18 kg/m2  SpO2 94%      ASSESSMENT:  Diabetes type 2, long-standing with obesity See history of present illness for detailed discussion of his current management, blood sugar patterns and problems identified.  Although he is generally having fairly good readings overall he has relatively higher readings in the afternoons and this is partly related due to his not taking insulin before he is eating lunch, particularly when he is eating out Also may be getting some effect from the low-dose prednisone he takes in the morning. He is taking only a small amount of insulin at breakfast for mealtime coverage  Currently appears to be having low normal readings waking up and probably needs less basal insulin A1c and fructosamine appear to be a good range, A1c not entirely accurate because of his renal insufficiency  HYPERTENSION: This is managed by nephrologist and well controlled  PLAN:   We'll increase his U-500 insulin to 30 units before lunch to help with afternoon hyperglycemia  Also need to take this very consistently 30 minutes before lunch or midday  May need at least some insulin even if he is eating a small  Lunch  Will reduce his Toujeo Insulin by 10 units twice a day to reduce overnight tendency to blood sugar drops and also since midday regular insulin is being increased  He will bring his monitor  for download on next visit.  Does not certainly have to check sugar 5 times a day, may do this only at mealtimes and bedtime  Consider using One Touch monitor where he can mark his  postprandial readings since he is eating erratically and not clear which readings have been postprandial  Patient Instructions  Increase U-500 to 30 before lunch and take daily   Toujeo to 80 units twice  Daily        Counseling time on subjects discussed above is over 50% of today's 25 minute visit  Keddrick Wyne 08/26/2015, 3:36 PM   Note: This office note was prepared with Dragon voice recognition system technology. Any transcriptional errors that result from this process are unintentional.

## 2015-08-29 ENCOUNTER — Encounter: Payer: Self-pay | Admitting: Vascular Surgery

## 2015-09-02 ENCOUNTER — Ambulatory Visit: Payer: Medicare Other | Admitting: Internal Medicine

## 2015-09-02 DIAGNOSIS — Z0289 Encounter for other administrative examinations: Secondary | ICD-10-CM

## 2015-09-03 ENCOUNTER — Ambulatory Visit: Payer: Self-pay | Admitting: Vascular Surgery

## 2015-09-03 ENCOUNTER — Encounter (HOSPITAL_COMMUNITY): Payer: Self-pay

## 2015-09-03 ENCOUNTER — Other Ambulatory Visit (HOSPITAL_COMMUNITY): Payer: Self-pay

## 2015-09-13 ENCOUNTER — Emergency Department (HOSPITAL_COMMUNITY)
Admission: EM | Admit: 2015-09-13 | Discharge: 2015-09-14 | Disposition: A | Discharge status: Home / Self Care | Payer: Medicare Other | Attending: Emergency Medicine | Admitting: Emergency Medicine

## 2015-09-13 ENCOUNTER — Encounter (HOSPITAL_COMMUNITY): Payer: Self-pay | Admitting: Family Medicine

## 2015-09-13 DIAGNOSIS — Z79899 Other long term (current) drug therapy: Secondary | ICD-10-CM | POA: Insufficient documentation

## 2015-09-13 DIAGNOSIS — Z8669 Personal history of other diseases of the nervous system and sense organs: Secondary | ICD-10-CM | POA: Insufficient documentation

## 2015-09-13 DIAGNOSIS — E669 Obesity, unspecified: Secondary | ICD-10-CM | POA: Diagnosis not present

## 2015-09-13 DIAGNOSIS — Z8719 Personal history of other diseases of the digestive system: Secondary | ICD-10-CM | POA: Insufficient documentation

## 2015-09-13 DIAGNOSIS — R300 Dysuria: Secondary | ICD-10-CM | POA: Diagnosis not present

## 2015-09-13 DIAGNOSIS — E119 Type 2 diabetes mellitus without complications: Secondary | ICD-10-CM | POA: Insufficient documentation

## 2015-09-13 DIAGNOSIS — E785 Hyperlipidemia, unspecified: Secondary | ICD-10-CM | POA: Insufficient documentation

## 2015-09-13 DIAGNOSIS — M109 Gout, unspecified: Secondary | ICD-10-CM | POA: Insufficient documentation

## 2015-09-13 DIAGNOSIS — Z792 Long term (current) use of antibiotics: Secondary | ICD-10-CM | POA: Insufficient documentation

## 2015-09-13 DIAGNOSIS — J449 Chronic obstructive pulmonary disease, unspecified: Secondary | ICD-10-CM | POA: Insufficient documentation

## 2015-09-13 DIAGNOSIS — Z7982 Long term (current) use of aspirin: Secondary | ICD-10-CM | POA: Insufficient documentation

## 2015-09-13 DIAGNOSIS — N189 Chronic kidney disease, unspecified: Secondary | ICD-10-CM | POA: Insufficient documentation

## 2015-09-13 DIAGNOSIS — Z794 Long term (current) use of insulin: Secondary | ICD-10-CM | POA: Diagnosis not present

## 2015-09-13 DIAGNOSIS — F419 Anxiety disorder, unspecified: Secondary | ICD-10-CM | POA: Diagnosis not present

## 2015-09-13 DIAGNOSIS — N289 Disorder of kidney and ureter, unspecified: Secondary | ICD-10-CM

## 2015-09-13 DIAGNOSIS — R339 Retention of urine, unspecified: Secondary | ICD-10-CM | POA: Diagnosis not present

## 2015-09-13 DIAGNOSIS — Z7952 Long term (current) use of systemic steroids: Secondary | ICD-10-CM | POA: Insufficient documentation

## 2015-09-13 DIAGNOSIS — R112 Nausea with vomiting, unspecified: Secondary | ICD-10-CM | POA: Diagnosis not present

## 2015-09-13 DIAGNOSIS — I129 Hypertensive chronic kidney disease with stage 1 through stage 4 chronic kidney disease, or unspecified chronic kidney disease: Secondary | ICD-10-CM | POA: Diagnosis not present

## 2015-09-13 DIAGNOSIS — I509 Heart failure, unspecified: Secondary | ICD-10-CM | POA: Diagnosis not present

## 2015-09-13 DIAGNOSIS — Z940 Kidney transplant status: Secondary | ICD-10-CM | POA: Diagnosis not present

## 2015-09-13 LAB — I-STAT CHEM 8, ED
BUN: 56 mg/dL — ABNORMAL HIGH (ref 6–20)
Calcium, Ion: 1.03 mmol/L — ABNORMAL LOW (ref 1.12–1.23)
Chloride: 103 mmol/L (ref 101–111)
Creatinine, Ser: 3.6 mg/dL — ABNORMAL HIGH (ref 0.61–1.24)
Glucose, Bld: 70 mg/dL (ref 65–99)
HCT: 33 % — ABNORMAL LOW (ref 39.0–52.0)
Hemoglobin: 11.2 g/dL — ABNORMAL LOW (ref 13.0–17.0)
Potassium: 4 mmol/L (ref 3.5–5.1)
Sodium: 144 mmol/L (ref 135–145)
TCO2: 25 mmol/L (ref 0–100)

## 2015-09-13 LAB — CBC WITH DIFFERENTIAL/PLATELET
Basophils Absolute: 0 10*3/uL (ref 0.0–0.1)
Basophils Relative: 0 %
Eosinophils Absolute: 0.1 10*3/uL (ref 0.0–0.7)
Eosinophils Relative: 1 %
HCT: 34.2 % — ABNORMAL LOW (ref 39.0–52.0)
Hemoglobin: 10.9 g/dL — ABNORMAL LOW (ref 13.0–17.0)
Lymphocytes Relative: 10 %
Lymphs Abs: 0.7 10*3/uL (ref 0.7–4.0)
MCH: 27.6 pg (ref 26.0–34.0)
MCHC: 31.9 g/dL (ref 30.0–36.0)
MCV: 86.6 fL (ref 78.0–100.0)
Monocytes Absolute: 0.2 10*3/uL (ref 0.1–1.0)
Monocytes Relative: 3 %
Neutro Abs: 6 10*3/uL (ref 1.7–7.7)
Neutrophils Relative %: 86 %
Platelets: 236 10*3/uL (ref 150–400)
RBC: 3.95 MIL/uL — ABNORMAL LOW (ref 4.22–5.81)
RDW: 14.4 % (ref 11.5–15.5)
WBC: 7 10*3/uL (ref 4.0–10.5)

## 2015-09-13 LAB — URINALYSIS, ROUTINE W REFLEX MICROSCOPIC
Bilirubin Urine: NEGATIVE
Glucose, UA: NEGATIVE mg/dL
Ketones, ur: NEGATIVE mg/dL
Leukocytes, UA: NEGATIVE
Nitrite: NEGATIVE
Protein, ur: >300 mg/dL — AB
Specific Gravity, Urine: 1.01 (ref 1.005–1.030)
pH: 6 (ref 5.0–8.0)

## 2015-09-13 LAB — CBG MONITORING, ED: Glucose-Capillary: 73 mg/dL (ref 65–99)

## 2015-09-13 LAB — URINE MICROSCOPIC-ADD ON

## 2015-09-13 MED ORDER — LIDOCAINE HCL 2 % EX GEL
1.0000 "application " | Freq: Once | CUTANEOUS | Status: AC
  Administered 2015-09-13: 1 via TOPICAL
  Filled 2015-09-13: qty 20

## 2015-09-13 MED ORDER — HYDROCODONE-ACETAMINOPHEN 5-325 MG PO TABS
1.0000 | ORAL_TABLET | Freq: Once | ORAL | Status: AC
  Administered 2015-09-13: 1 via ORAL
  Filled 2015-09-13: qty 1

## 2015-09-13 MED ORDER — PREDNISONE 5 MG PO TABS
5.0000 mg | ORAL_TABLET | Freq: Once | ORAL | Status: AC
  Administered 2015-09-13: 5 mg via ORAL
  Filled 2015-09-13: qty 1

## 2015-09-13 MED ORDER — SULFAMETHOXAZOLE-TRIMETHOPRIM 400-80 MG PO TABS
1.0000 | ORAL_TABLET | Freq: Two times a day (BID) | ORAL | Status: DC
  Administered 2015-09-13: 1 via ORAL
  Filled 2015-09-13: qty 1

## 2015-09-13 MED ORDER — VALGANCICLOVIR HCL 450 MG PO TABS
450.0000 mg | ORAL_TABLET | Freq: Once | ORAL | Status: AC
  Administered 2015-09-13: 450 mg via ORAL
  Filled 2015-09-13: qty 1

## 2015-09-13 MED ORDER — MYCOPHENOLATE MOFETIL 250 MG PO CAPS
750.0000 mg | ORAL_CAPSULE | Freq: Once | ORAL | Status: AC
  Administered 2015-09-13: 750 mg via ORAL
  Filled 2015-09-13: qty 3

## 2015-09-13 MED ORDER — LIDOCAINE HCL 2 % EX GEL
1.0000 "application " | Freq: Once | CUTANEOUS | Status: DC
  Filled 2015-09-13: qty 40

## 2015-09-13 MED ORDER — TACROLIMUS 1 MG PO CAPS
2.5000 mg | ORAL_CAPSULE | Freq: Once | ORAL | Status: AC
  Administered 2015-09-13: 2.5 mg via ORAL
  Filled 2015-09-13: qty 1

## 2015-09-13 NOTE — ED Provider Notes (Signed)
CSN: [id number]     Arrival date & time 09/13/15  1540 History   First MD Initiated Contact with Patient 09/13/15 1703     Chief Complaint  Patient presents with  . Urinary Retention     (Consider location/radiation/quality/duration/timing/severity/associated sxs/prior Treatment) The history is provided by the patient and medical records. No language interpreter was used.   [name] [name] is a 49 y.o. male  with a PMH of kidney transplant 2009, CKD, DM, endocarditis, and other chronic medical conditions who presents to the Emergency Department complaining of urinary retention. Pt. States that he went to the restroom as usual this morning, but has not been able to go since despite having the urge several times. Pt. States he feels uncomfortable with 4/10 pain described as pressure. No fever/chills, back pain, dysuria prior to incident. Of note, patient did complete all IV ABX for endocarditis in the past year, and last cx showed no growth.   Past Medical History  Diagnosis Date  . Diabetes mellitus   . Hypertension   . Leg pain 02/05/11    with walking  . Weight gain   . Shortness of breath     when lying flat  . Diminished eyesight   . Hearing difficulty   . Chronic kidney disease     Dia;ysis MWF  . Anemia   . Nervousness(799.21)   . COPD (chronic obstructive pulmonary disease) ([place])   . CHF (congestive heart failure) ([place])   . Posttraumatic stress disorder   . Allergic rhinitis   . Dyslipidemia   . Morbid obesity ([place])   . Diabetic peripheral neuropathy ([place])   . Gait disorder   . Obstructive sleep apnea   . Bilateral carpal tunnel syndrome   . Anxiety disorder   . Gastroparesis   . Gout   . Diabetic retinopathy ([place])   . Renal transplant, status post 04/05/2014  . Incomplete bladder emptying   . Urethral stricture   . Aortic insufficiency     a. Echo 7/16:  Mod LVH, EF 50-55%, mild to mod AI, severe LAE, PASP 57 mmHg (LV ID end diastolic 27.2 mm)  . Hyperlipidemia    . Osteomyelitis ([place])   . Endocarditis   . Chronic renal allograft nephropathy   . Vitamin D deficiency   . Secondary hyperparathyroidism ([place])   . Rotator cuff disorder   . Discitis of lumbar region     resolved   Past Surgical History  Procedure Laterality Date  . Kidney transplant  2009  . Cataract extraction w/ intraocular lens  implant, bilateral Bilateral   . Av fistula placement Bilateral   . Arteriovenous graft placement Bilateral "several"  . Cystoscopy w/ internal urethrotomy     Family History  Problem Relation Age of Onset  . Hypertension Mother   . Diabetes Father    Social History  Substance Use Topics  . Smoking status: Never Smoker   . Smokeless tobacco: Never Used  . Alcohol Use: No    Review of Systems  Constitutional: Negative.   HENT: Negative for congestion, rhinorrhea and sore throat.   Eyes: Negative for visual disturbance.  Respiratory: Negative for cough, shortness of breath and wheezing.   Cardiovascular: Negative.   Gastrointestinal: Negative for nausea, vomiting, abdominal pain, diarrhea and constipation.  Genitourinary: Positive for difficulty urinating. Negative for dysuria and flank pain.  Musculoskeletal: Negative for myalgias, back pain, arthralgias and neck pain.  Skin: Negative for rash.  Neurological: Negative for dizziness, weakness and headaches.  Allergies  Penicillins; Pork-derived products; and Ace inhibitors  Home Medications   Prior to Admission medications   Medication Sig Start Date End Date Taking? Authorizing Provider  aspirin 325 MG tablet Take 81 mg by mouth daily.    Yes Historical Provider, MD  calcitRIOL (ROCALTROL) 0.5 MCG capsule Take 0.5 mcg by mouth daily. Takes 3 tablets daily.   Yes Historical Provider, MD  clonazePAM (KLONOPIN) 0.5 MG tablet Take 1 mg by mouth at bedtime.    Yes Historical Provider, MD  cloNIDine (CATAPRES) 0.3 MG tablet Take 0.3 mg by mouth 3 (three) times daily.    Yes Historical  Provider, MD  Febuxostat (ULORIC) 80 MG TABS Take 80 mg by mouth daily.   Yes Historical Provider, MD  fluconazole (DIFLUCAN) 50 MG tablet Take 50 mg by mouth 3 (three) times a week.    Yes Historical Provider, MD  furosemide (LASIX) 80 MG tablet Take 2 tablets by mouth 2 (two) times daily. 12/04/14  Yes Historical Provider, MD  Insulin Glargine (TOUJEO SOLOSTAR) 300 UNIT/ML SOPN Inject 90 Units into the skin 2 (two) times daily. 08/26/15  Yes Elayne Snare, MD  insulin regular human CONCENTRATED (HUMULIN R U-500 KWIKPEN) 500 UNIT/ML kwikpen Inject 10 Units into the skin once. U-500 insulin 10 before bfst, 25 before lunch and 20 before dinner, take 30 min before Sliding scale use for high sugars:  Sugar 150-199 take 2 units, 200-250= 4 units and over 250 take 6 units 08/26/15  Yes Elayne Snare, MD  mycophenolate (CELLCEPT) 250 MG capsule Take 750 mg by mouth 2 (two) times daily.    Yes Historical Provider, MD  omeprazole (PRILOSEC) 20 MG capsule Take 20 mg by mouth daily.   Yes Historical Provider, MD  predniSONE (DELTASONE) 5 MG tablet Take 5 mg by mouth daily.     Yes Historical Provider, MD  pregabalin (LYRICA) 100 MG capsule Take 100 mg by mouth every evening.    Yes Historical Provider, MD  rosuvastatin (CRESTOR) 20 MG tablet Take 1 tablet (20 mg total) by mouth at bedtime. 04/11/15  Yes Hoyt Koch, MD  sulfamethoxazole-trimethoprim (BACTRIM,SEPTRA) 400-80 MG tablet Take 1 tablet by mouth 3 (three) times a week. Takes 1 tablet M/W/F.   Yes Historical Provider, MD  tacrolimus (PROGRAF) 0.5 MG capsule Take 2.5 mg by mouth 2 (two) times daily. Takes 5 capsules 2 times a day   Yes Historical Provider, MD  valGANciclovir (VALCYTE) 450 MG tablet Take 450 mg by mouth. Takes 1 tablet  M/W/F.   Yes Historical Provider, MD  ACCU-CHEK FASTCLIX LANCETS MISC Use to check blood sugar 4 times per day dx code E11.9 04/09/15   Elayne Snare, MD  glucose blood (ACCU-CHEK SMARTVIEW) test strip Use as instructed  to check blood sugar 4 times per day dx code E11.9 06/25/15   Elayne Snare, MD  HYDROcodone-acetaminophen (NORCO/VICODIN) 5-325 MG tablet Take 1-2 tablets by mouth every 6 (six) hours as needed for severe pain. 09/14/15   Ozella Almond Ward, PA-C  Insulin Pen Needle (BD PEN NEEDLE NANO U/F) 32G X 4 MM MISC Use 5 per day 05/16/15   Elayne Snare, MD  tadalafil (CIALIS) 5 MG tablet Take 5 mg by mouth daily as needed for erectile dysfunction.    Historical Provider, MD   BP 188/55 mmHg  Pulse 96  Temp(Src) 97.7 F (36.5 C) (Oral)  Resp 16  SpO2 94% Physical Exam  Constitutional: He is oriented to person, place, and time. He appears well-developed and  well-nourished.  WDWN obese male who appears uncomfortable but in NAD  HENT:  Head: Normocephalic and atraumatic.  Cardiovascular: Normal rate, regular rhythm, normal heart sounds and intact distal pulses.  Exam reveals no gallop and no friction rub.   No murmur heard. Pulmonary/Chest: Effort normal and breath sounds normal. No respiratory distress. He has no wheezes. He has no rales. He exhibits no tenderness.  Abdominal: He exhibits no mass. There is no rebound and no guarding.  Abdomen soft, non-tender, mildly distended - no suprapubic tenderness Bowel sounds positive in all four quadrants  Musculoskeletal:  No CVA tenderness  Neurological: He is alert and oriented to person, place, and time.  Skin: Skin is warm and dry. No rash noted.  Psychiatric: He has a normal mood and affect. His behavior is normal. Judgment and thought content normal.  Nursing note and vitals reviewed.   ED Course  Procedures (including critical care time) Labs Review Labs Reviewed  URINALYSIS, ROUTINE W REFLEX MICROSCOPIC (NOT AT [facility]) - Abnormal; Notable for the following:    APPearance CLOUDY (*)    Hgb urine dipstick LARGE (*)    Protein, ur >300 (*)    All other components within normal limits  CBC WITH DIFFERENTIAL/PLATELET - Abnormal; Notable for the  following:    RBC 3.95 (*)    Hemoglobin 10.9 (*)    HCT 34.2 (*)    All other components within normal limits  URINE MICROSCOPIC-ADD ON - Abnormal; Notable for the following:    Squamous Epithelial / LPF 0-5 (*)    Bacteria, UA RARE (*)    All other components within normal limits  I-STAT CHEM 8, ED - Abnormal; Notable for the following:    BUN 56 (*)    Creatinine, Ser 3.60 (*)    Calcium, Ion 1.03 (*)    Hemoglobin 11.2 (*)    HCT 33.0 (*)    All other components within normal limits  URINE CULTURE  CBG MONITORING, ED    Imaging Review No results found. I have personally reviewed and evaluated these images and lab results as part of my medical decision-making.   EKG Interpretation None      MDM   Final diagnoses:  Urine retention  H/O kidney transplant  Renal insufficiency   Uriah Bau presents with complicated PMH - kidney transplant 2009 - followed by Dr. Lorrene Reid, nephrology and Dr. Rosana Hoes- urology  Nursing staff made multiple attempts to place foley catheter but met too much resistance - blood was noticed in the process. Will consult urology who will come see patient. Foley cart at bedside.   Patient able to make ~ 300 cc's of urine- nurses state it came with a great deal of pain and increased HR.   8:32 PM - Informed by pharmacy that pt. Has not received his anti-rejection meds today and recommend ordering these home meds to give here.   10:15 PM - Spoke with urology who believe from a uro stand point he is okay to be dc'd to home with pain control. He has been instructed on strict follow-up and will be seen in their clinic in 1 week. We discussed antibiotics for home, and agree that his 419m Bactrim he is currently on will suffice - no additional abx needed. Appreciate assistance by urology.    1:07 AM - Patient re-evaluated, pain has been controlled. Will discharge home with pain control. I explained the diagnosis and have given precautions as to when/if to  return to the ER  including for any other new or worsening symptoms. The patient understands and accepts the medical plan as it's been dictated and I have answered their questions. Discharge instructions concerning home care and prescriptions have been given. The patient is stable and is discharged to home in good condition.  Patient seen by and discussed with Dr. Billy Fischer who agrees with treatment plan.     [facility] Ward, PA-C 09/14/15 0370  Gareth Morgan, MD 09/15/15 2232

## 2015-09-13 NOTE — ED Notes (Signed)
Pt states last time he was able to urinate was 0600 today.  Pt states he feels pressure in his organ.  Dr. Rosana Hoes is his urologist, Dr Lorrene Reid is his kidney doctor.

## 2015-09-13 NOTE — ED Notes (Signed)
One RN attempted foley insertion. Met resistance. This RN attempted coude, met resistance. Pt in intense pain. Notified PA

## 2015-09-13 NOTE — ED Notes (Signed)
Pt here for urinary retention. sts that he has tried to go 3 times and unable. St pressure in lower abdomen.

## 2015-09-13 NOTE — Consult Note (Signed)
Foley Catheter Placement Note  Indications:  1. Urethral stricture 2. Urinary retention   Pre-operative Diagnosis:  1. Urethral stricture 2. Urinary retention   Post-operative Diagnosis: Same  Surgeon: Franchot Gallo, MD   Resident: E. Harvel Ricks, MD  Assistants: None  Procedure Details  Patient was placed in the supine position, prepped with Betadine and draped in the usual sterile fashion.  We injected lidocaine jelly per urethra prior to the procedure.    We then proceeded with cystourethroscopy. The patient was positioned supine on the table in the relaxed dorsal lithotomy  position and the external genitalia were prepped and draped in the standard fashion.  The cystoscope was inserted per urethra with copious lubrication.  The anterior urethra was normal.  At the level of the bulbar urethra we encountered a thin pin point 3 French urethral stricture. We passed a sensor wire through the stricture under direct visualization without meeting any resistance until the wire curled in the bladder. We then removed the cystoscopy and obtained a 16 french council tip catheter and advanced the foley over the wire into the bladder with some force return of clear yellow urine. We inflated the balloon with 10 mL sterile water and removed the wire. The foley was attached to a drainage bag and secured with a StatLock.  Placement of the catheter had return of greater than 2.5 L of clear yellow urine.  Complications: None; patient tolerated the procedure well.  Plan:   1.  Continue Foley catheter to drainage for 7 days due to urethral dilation and greater than 2.5 L of urine returned with catheter placement and severe bladder stretch injury 2.  Recommend drinking to thirst but overall drinking more then usual 3. Recommend increasing intake of potassium with bananas, oranges and orange juice for next 1-2 weeks due to potassium loss with post-obstructive diuresis

## 2015-09-13 NOTE — Consult Note (Signed)
Urology Consult  Referring physician: Rolan Lipa, MD Reason for referral: Urinary retention  Chief Complaint: Urinary retention  History of Present Illness:  [name] [name] is a 49 y.o. male with a PMH of urethral stricture s/p urethral dilation in 2015, kidney transplant 2009, CKD, DM, endocarditis, and other chronic medical conditions who presents to the Emergency Department complaining of urinary retention. Pt. States that he went to the restroom as usual this morning, but has not been able to go since despite having the urge several times. Pt. States he feels uncomfortable with 4/10 pain described as pressure. No fever/chills, back pain, dysuria prior to incident. Of note, patient did complete all IV ABX for endocarditis in the past year, and last cx showed no growth.   He reports undergoing a urethral dilation with Dr. Rosana Hoes at [facility] ~1 year ago. He is unsure what caused the stricture.   Past Medical History  Diagnosis Date  . Diabetes mellitus   . Hypertension   . Leg pain 02/05/11    with walking  . Weight gain   . Shortness of breath     when lying flat  . Diminished eyesight   . Hearing difficulty   . Chronic kidney disease     Dia;ysis MWF  . Anemia   . Nervousness(799.21)   . COPD (chronic obstructive pulmonary disease) ([place])   . CHF (congestive heart failure) ([place])   . Posttraumatic stress disorder   . Allergic rhinitis   . Dyslipidemia   . Morbid obesity ([place])   . Diabetic peripheral neuropathy ([place])   . Gait disorder   . Obstructive sleep apnea   . Bilateral carpal tunnel syndrome   . Anxiety disorder   . Gastroparesis   . Gout   . Diabetic retinopathy ([place])   . Renal transplant, status post 04/05/2014  . Incomplete bladder emptying   . Urethral stricture   . Aortic insufficiency     a. Echo 7/16:  Mod LVH, EF 50-55%, mild to mod AI, severe LAE, PASP 57 mmHg (LV ID end diastolic Q000111Q mm)  . Hyperlipidemia   . Osteomyelitis ([place])   . Endocarditis    . Chronic renal allograft nephropathy   . Vitamin D deficiency   . Secondary hyperparathyroidism ([place])   . Rotator cuff disorder   . Discitis of lumbar region     resolved   Past Surgical History  Procedure Laterality Date  . Kidney transplant  2009  . Cataract extraction w/ intraocular lens  implant, bilateral Bilateral   . Av fistula placement Bilateral   . Arteriovenous graft placement Bilateral "several"  . Cystoscopy w/ internal urethrotomy      Medications: I have reviewed the patient's current medications. Allergies:  Allergies  Allergen Reactions  . Penicillins Rash and Other (See Comments)    fever  . Pork-Derived Products Anaphylaxis and Other (See Comments)    Fever   . Ace Inhibitors Cough    Family History  Problem Relation Age of Onset  . Hypertension Mother   . Diabetes Father    Social History:  reports that he has never smoked. He has never used smokeless tobacco. He reports that he does not drink alcohol or use illicit drugs.  Review of Systems  All other systems reviewed and are negative.   Physical Exam:  Vital signs in last 24 hours: Temp:  [97.8 F (36.6 C)] 97.8 F (36.6 C) (12/02 1546) Pulse Rate:  [76-93] 81 (12/02 2115) Resp:  [15-18] 15 (12/02  1817) BP: (141-176)/(69-97) 170/78 mmHg (12/02 2115) SpO2:  [89 %-98 %] 96 % (12/02 2100) Physical Exam  Constitutional: He is oriented to person, place, and time. He appears well-developed and well-nourished. He appears distressed.  HENT:  Head: Normocephalic and atraumatic.  Respiratory: Effort normal.  GI: Soft. He exhibits distension.  Genitourinary: Penis normal.  Musculoskeletal: Normal range of motion.  Neurological: He is alert and oriented to person, place, and time.    Laboratory Data:  Results for orders placed or performed during the hospital encounter of 09/13/15 (from the past 72 hour(s))  CBG monitoring, ED     Status: None   Collection Time: 09/13/15  4:45 PM  Result  Value Ref Range   Glucose-Capillary 73 65 - 99 mg/dL  CBC with Differential     Status: Abnormal   Collection Time: 09/13/15  6:23 PM  Result Value Ref Range   WBC 7.0 4.0 - 10.5 K/uL   RBC 3.95 (L) 4.22 - 5.81 MIL/uL   Hemoglobin 10.9 (L) 13.0 - 17.0 g/dL   HCT 34.2 (L) 39.0 - 52.0 %   MCV 86.6 78.0 - 100.0 fL   MCH 27.6 26.0 - 34.0 pg   MCHC 31.9 30.0 - 36.0 g/dL   RDW 14.4 11.5 - 15.5 %   Platelets 236 150 - 400 K/uL   Neutrophils Relative % 86 %   Neutro Abs 6.0 1.7 - 7.7 K/uL   Lymphocytes Relative 10 %   Lymphs Abs 0.7 0.7 - 4.0 K/uL   Monocytes Relative 3 %   Monocytes Absolute 0.2 0.1 - 1.0 K/uL   Eosinophils Relative 1 %   Eosinophils Absolute 0.1 0.0 - 0.7 K/uL   Basophils Relative 0 %   Basophils Absolute 0.0 0.0 - 0.1 K/uL  Urinalysis, Routine w reflex microscopic (not at [facility])     Status: Abnormal   Collection Time: 09/13/15  7:15 PM  Result Value Ref Range   Color, Urine YELLOW YELLOW   APPearance CLOUDY (A) CLEAR   Specific Gravity, Urine 1.010 1.005 - 1.030   pH 6.0 5.0 - 8.0   Glucose, UA NEGATIVE NEGATIVE mg/dL   Hgb urine dipstick LARGE (A) NEGATIVE   Bilirubin Urine NEGATIVE NEGATIVE   Ketones, ur NEGATIVE NEGATIVE mg/dL   Protein, ur >300 (A) NEGATIVE mg/dL   Nitrite NEGATIVE NEGATIVE   Leukocytes, UA NEGATIVE NEGATIVE  Urine microscopic-add on     Status: Abnormal   Collection Time: 09/13/15  7:15 PM  Result Value Ref Range   Squamous Epithelial / LPF 0-5 (A) NONE SEEN   WBC, UA 0-5 0 - 5 WBC/hpf   RBC / HPF TOO NUMEROUS TO COUNT 0 - 5 RBC/hpf   Bacteria, UA RARE (A) NONE SEEN   No results found for this or any previous visit (from the past 240 hour(s)). Creatinine: No results for input(s): CREATININE in the last 168 hours.  Impression/Assessment:  49 yo M with history of urethral stricture presenting with acute urinary retention, possibly acute on chronic urinary retention. Now s/p cystoscopy and placement of council tip catheter over wire  with >2.5L UOP return.  Plan:  1. Recommend contacting hospitalist service for possible observation for post-obstructive diuresis given numerous medical problems 2. If they do not feel admission is required given acute onset of retention OK to discharge to home with the following instructions: 3. Recommend drinking to thirst - overall will have to increase fluid intake if high urine output 4. Increase potassium intake via Banana's and  citrus as post-obstructive diuresis can lead to hypokalemia 5. Discharge patient on 3 days of renally dosed antibiotics - levaquin, bactrim or doxy are good options 6. Place referral to [facility] - he should call on Monday morning and set up an appointment to be seen on Friday for removal of foley and TOV 7. Please discharge patient with leg bag and teach how to switch between the leg bag during day and large foley bag on floor next to bed at night.  Acie Fredrickson 09/13/2015, 9:56 PM

## 2015-09-13 NOTE — ED Notes (Signed)
Urology at bedside.

## 2015-09-13 NOTE — ED Notes (Signed)
Bladder scanned pt- >959mL. Notified PA. Urology cart outside pt's room

## 2015-09-14 ENCOUNTER — Emergency Department (HOSPITAL_COMMUNITY)
Admission: EM | Admit: 2015-09-14 | Discharge: 2015-09-14 | Disposition: A | Discharge status: Home / Self Care | Payer: Medicare Other | Source: Home / Self Care | Attending: Emergency Medicine | Admitting: Emergency Medicine

## 2015-09-14 ENCOUNTER — Encounter (HOSPITAL_COMMUNITY): Payer: Self-pay | Admitting: Emergency Medicine

## 2015-09-14 DIAGNOSIS — Z7952 Long term (current) use of systemic steroids: Secondary | ICD-10-CM

## 2015-09-14 DIAGNOSIS — Z992 Dependence on renal dialysis: Secondary | ICD-10-CM

## 2015-09-14 DIAGNOSIS — I120 Hypertensive chronic kidney disease with stage 5 chronic kidney disease or end stage renal disease: Secondary | ICD-10-CM

## 2015-09-14 DIAGNOSIS — J449 Chronic obstructive pulmonary disease, unspecified: Secondary | ICD-10-CM | POA: Insufficient documentation

## 2015-09-14 DIAGNOSIS — E1142 Type 2 diabetes mellitus with diabetic polyneuropathy: Secondary | ICD-10-CM

## 2015-09-14 DIAGNOSIS — I509 Heart failure, unspecified: Secondary | ICD-10-CM

## 2015-09-14 DIAGNOSIS — H9190 Unspecified hearing loss, unspecified ear: Secondary | ICD-10-CM | POA: Insufficient documentation

## 2015-09-14 DIAGNOSIS — M109 Gout, unspecified: Secondary | ICD-10-CM | POA: Insufficient documentation

## 2015-09-14 DIAGNOSIS — F419 Anxiety disorder, unspecified: Secondary | ICD-10-CM

## 2015-09-14 DIAGNOSIS — Z862 Personal history of diseases of the blood and blood-forming organs and certain disorders involving the immune mechanism: Secondary | ICD-10-CM

## 2015-09-14 DIAGNOSIS — Z79899 Other long term (current) drug therapy: Secondary | ICD-10-CM

## 2015-09-14 DIAGNOSIS — N186 End stage renal disease: Secondary | ICD-10-CM

## 2015-09-14 DIAGNOSIS — R112 Nausea with vomiting, unspecified: Secondary | ICD-10-CM

## 2015-09-14 DIAGNOSIS — R339 Retention of urine, unspecified: Secondary | ICD-10-CM | POA: Diagnosis not present

## 2015-09-14 DIAGNOSIS — E785 Hyperlipidemia, unspecified: Secondary | ICD-10-CM | POA: Insufficient documentation

## 2015-09-14 DIAGNOSIS — E11319 Type 2 diabetes mellitus with unspecified diabetic retinopathy without macular edema: Secondary | ICD-10-CM

## 2015-09-14 DIAGNOSIS — Z940 Kidney transplant status: Secondary | ICD-10-CM | POA: Insufficient documentation

## 2015-09-14 DIAGNOSIS — Z7982 Long term (current) use of aspirin: Secondary | ICD-10-CM

## 2015-09-14 DIAGNOSIS — Z880 Allergy status to penicillin: Secondary | ICD-10-CM

## 2015-09-14 MED ORDER — HYDROCODONE-ACETAMINOPHEN 5-325 MG PO TABS
1.0000 | ORAL_TABLET | Freq: Four times a day (QID) | ORAL | Status: DC | PRN
Start: 2015-09-14 — End: 2015-09-27

## 2015-09-14 MED ORDER — DIAZEPAM 5 MG PO TABS: 5.0000 mg | ORAL_TABLET | Freq: Two times a day (BID) | ORAL | Status: DC

## 2015-09-14 MED ORDER — ONDANSETRON 4 MG PO TBDP
8.0000 mg | ORAL_TABLET | Freq: Once | ORAL | Status: AC
  Administered 2015-09-14: 8 mg via ORAL
  Filled 2015-09-14: qty 2

## 2015-09-14 MED ORDER — DIAZEPAM 5 MG PO TABS
5.0000 mg | ORAL_TABLET | Freq: Once | ORAL | Status: AC
  Administered 2015-09-14: 5 mg via ORAL
  Filled 2015-09-14: qty 1

## 2015-09-14 MED ORDER — ONDANSETRON HCL 8 MG PO TABS
8.0000 mg | ORAL_TABLET | Freq: Three times a day (TID) | ORAL | Status: DC | PRN
Start: 2015-09-14 — End: 2015-10-26

## 2015-09-14 NOTE — ED Notes (Signed)
Pt verbalized understanding of d/c instructions, prescriptions, and follow-up care. No further questions/concerns, VSS, assisted to lobby in wheelchair.  

## 2015-09-14 NOTE — ED Notes (Signed)
Pt. reported emesis x3 while in the waiting room , pt. was seen, treated and discharged here this evening , denies diarrhea / respirations unlabored . Occasional productive cough.

## 2015-09-14 NOTE — Discharge Instructions (Signed)

## 2015-09-14 NOTE — ED Provider Notes (Signed)
CSN: [id number]     Arrival date & time 09/14/15  [id number] History   First MD Initiated Contact with Patient 09/14/15 [phone]     Chief Complaint  Patient presents with  . Emesis      HPI Patient presents to the emergency department complaining of vomiting.  He was just seen in the emergency department was waiting in the waiting room when he began having nausea and vomiting.  He was seen several hours ago for urinary retention for which he had a Foley catheter placed.  He denies fevers and chills.  He denies diarrhea.  He states he was slightly nauseated earlier but never vomited.  He is concerned that his nausea and vomiting is now secondary to taking oral medications on empty stomach.  He is asking for something the while he vomits.  He does have a history of renal transplant.  Review of the laboratory studies demonstrates baseline BUN/creatinine for the patient as compared to 3 weeks ago.  Patient denies back pain or flank pain.  No dysuria or urinary frequency.  Symptoms are moderate in severity.  He is actively vomiting during the history   Past Medical History  Diagnosis Date  . Diabetes mellitus   . Hypertension   . Leg pain 02/05/11    with walking  . Weight gain   . Shortness of breath     when lying flat  . Diminished eyesight   . Hearing difficulty   . Chronic kidney disease     Dia;ysis MWF  . Anemia   . Nervousness(799.21)   . COPD (chronic obstructive pulmonary disease) ([place])   . CHF (congestive heart failure) ([place])   . Posttraumatic stress disorder   . Allergic rhinitis   . Dyslipidemia   . Morbid obesity ([place])   . Diabetic peripheral neuropathy ([place])   . Gait disorder   . Obstructive sleep apnea   . Bilateral carpal tunnel syndrome   . Anxiety disorder   . Gastroparesis   . Gout   . Diabetic retinopathy ([place])   . Renal transplant, status post 04/05/2014  . Incomplete bladder emptying   . Urethral stricture   . Aortic insufficiency     a. Echo 7/16:  Mod LVH, EF  50-55%, mild to mod AI, severe LAE, PASP 57 mmHg (LV ID end diastolic Q000111Q mm)  . Hyperlipidemia   . Osteomyelitis ([place])   . Endocarditis   . Chronic renal allograft nephropathy   . Vitamin D deficiency   . Secondary hyperparathyroidism ([place])   . Rotator cuff disorder   . Discitis of lumbar region     resolved   Past Surgical History  Procedure Laterality Date  . Kidney transplant  2009  . Cataract extraction w/ intraocular lens  implant, bilateral Bilateral   . Av fistula placement Bilateral   . Arteriovenous graft placement Bilateral "several"  . Cystoscopy w/ internal urethrotomy     Family History  Problem Relation Age of Onset  . Hypertension Mother   . Diabetes Father    Social History  Substance Use Topics  . Smoking status: Never Smoker   . Smokeless tobacco: Never Used  . Alcohol Use: No    Review of Systems  All other systems reviewed and are negative.     Allergies  Penicillins; Pork-derived products; and Ace inhibitors  Home Medications   Prior to Admission medications   Medication Sig Start Date End Date Taking? Authorizing Provider  aspirin 325 MG tablet Take 81 mg  by mouth daily.    Yes Historical Provider, MD  calcitRIOL (ROCALTROL) 0.5 MCG capsule Take 0.5 mcg by mouth daily. Takes 3 tablets daily.   Yes Historical Provider, MD  clonazePAM (KLONOPIN) 0.5 MG tablet Take 1 mg by mouth at bedtime.    Yes Historical Provider, MD  cloNIDine (CATAPRES) 0.3 MG tablet Take 0.3 mg by mouth 3 (three) times daily.    Yes Historical Provider, MD  diazepam (VALIUM) 5 MG tablet Take 1 tablet (5 mg total) by mouth 2 (two) times daily. 09/14/15  Yes Jaime Pilcher Ward, PA-C  Febuxostat (ULORIC) 80 MG TABS Take 80 mg by mouth daily.   Yes Historical Provider, MD  fluconazole (DIFLUCAN) 50 MG tablet Take 50 mg by mouth 3 (three) times a week.    Yes Historical Provider, MD  furosemide (LASIX) 80 MG tablet Take 2 tablets by mouth 2 (two) times daily. 12/04/14  Yes  Historical Provider, MD  HYDROcodone-acetaminophen (NORCO/VICODIN) 5-325 MG tablet Take 1-2 tablets by mouth every 6 (six) hours as needed for severe pain. 09/14/15  Yes Jaime Pilcher Ward, PA-C  Insulin Glargine (TOUJEO SOLOSTAR) 300 UNIT/ML SOPN Inject 90 Units into the skin 2 (two) times daily. 08/26/15  Yes Elayne Snare, MD  insulin regular human CONCENTRATED (HUMULIN R U-500 KWIKPEN) 500 UNIT/ML kwikpen Inject 10 Units into the skin once. U-500 insulin 10 before bfst, 25 before lunch and 20 before dinner, take 30 min before Sliding scale use for high sugars:  Sugar 150-199 take 2 units, 200-250= 4 units and over 250 take 6 units 08/26/15  Yes Elayne Snare, MD  mycophenolate (CELLCEPT) 250 MG capsule Take 750 mg by mouth 2 (two) times daily.    Yes Historical Provider, MD  omeprazole (PRILOSEC) 20 MG capsule Take 20 mg by mouth daily.   Yes Historical Provider, MD  predniSONE (DELTASONE) 5 MG tablet Take 5 mg by mouth daily.     Yes Historical Provider, MD  pregabalin (LYRICA) 100 MG capsule Take 100 mg by mouth every evening.    Yes Historical Provider, MD  rosuvastatin (CRESTOR) 20 MG tablet Take 1 tablet (20 mg total) by mouth at bedtime. 04/11/15  Yes Hoyt Koch, MD  sulfamethoxazole-trimethoprim (BACTRIM,SEPTRA) 400-80 MG tablet Take 1 tablet by mouth 3 (three) times a week. Takes 1 tablet M/W/F.   Yes Historical Provider, MD  tacrolimus (PROGRAF) 0.5 MG capsule Take 2.5 mg by mouth 2 (two) times daily. Takes 5 capsules 2 times a day   Yes Historical Provider, MD  tadalafil (CIALIS) 5 MG tablet Take 5 mg by mouth daily as needed for erectile dysfunction.   Yes Historical Provider, MD  valGANciclovir (VALCYTE) 450 MG tablet Take 450 mg by mouth. Takes 1 tablet  M/W/F.   Yes Historical Provider, MD  ACCU-CHEK FASTCLIX LANCETS MISC Use to check blood sugar 4 times per day dx code E11.9 04/09/15   Elayne Snare, MD  glucose blood (ACCU-CHEK SMARTVIEW) test strip Use as instructed to check blood  sugar 4 times per day dx code E11.9 06/25/15   Elayne Snare, MD  Insulin Pen Needle (BD PEN NEEDLE NANO U/F) 32G X 4 MM MISC Use 5 per day 05/16/15   Elayne Snare, MD  ondansetron (ZOFRAN) 8 MG tablet Take 1 tablet (8 mg total) by mouth every 8 (eight) hours as needed for nausea or vomiting. 09/14/15   Jola Schmidt, MD   BP 135/81 mmHg  Pulse 97  Temp(Src) 98.4 F (36.9 C) (Oral)  Resp 20  SpO2 98% Physical Exam  Constitutional: He is oriented to person, place, and time. He appears well-developed and well-nourished.  HENT:  Head: Normocephalic and atraumatic.  Eyes: EOM are normal.  Neck: Normal range of motion.  Cardiovascular: Normal rate, regular rhythm, normal heart sounds and intact distal pulses.   Pulmonary/Chest: Effort normal and breath sounds normal. No respiratory distress.  Abdominal: Soft. He exhibits no distension. There is no tenderness.  Musculoskeletal: Normal range of motion.  Neurological: He is alert and oriented to person, place, and time.  Skin: Skin is warm and dry.  Psychiatric: He has a normal mood and affect. Judgment normal.  Nursing note and vitals reviewed.   ED Course  Procedures (including critical care time) Labs Review Labs Reviewed - No data to display  Imaging Review No results found. I have personally reviewed and evaluated these images and lab results as part of my medical decision-making.   EKG Interpretation None      MDM   Final diagnoses:  Nausea and vomiting, vomiting of unspecified type    Patient feels much better after Zofran here in the emergency department.  Laboratory studies and workup from recent ER visit were reviewed.  Patient is feeling much better.  He is able to keep oral fluids down this time.  Discharge home in good condition.    Jola Schmidt, MD 09/14/15 [phone]

## 2015-09-14 NOTE — Discharge Instructions (Signed)
It was my pleasure taking care of you today!   1. Medications: Norco as needed for pain - this medication can make you drowsy - do not drink, drive, or operate heavy machinery on this medication, continue usual home medications - taking your usual home antibiotics is very important! 2. Treatment: Drink to thirst - increase overall fluid intake, increase potassium intake - eat a banana and drink a glass of orange juice daily  3. Follow Up: Please call [facility] Monday morning and set up an appointment to be seen on Friday for removal of foley and further evaluation/management (phone number and address listed above); I would also like you to follow up with your kidney doctor as well; Please return to the ER for any new or worsening symptoms, any additional concerns.

## 2015-09-14 NOTE — ED Notes (Signed)
PA at bedside.

## 2015-09-15 LAB — URINE CULTURE: Culture: NO GROWTH

## 2015-09-18 ENCOUNTER — Encounter: Payer: Self-pay | Admitting: Vascular Surgery

## 2015-09-18 ENCOUNTER — Telehealth: Payer: Self-pay | Admitting: Cardiovascular Disease

## 2015-09-18 NOTE — Telephone Encounter (Signed)
Follow Up   Pt is returning call from yesterday. Please call.

## 2015-09-18 NOTE — Telephone Encounter (Signed)
Spoke with pt. He reports his phone showed a call from our office. No message.  I told pt I did not see where a call was placed to him from our office.  Pt is aware of echo on October 21, 2015

## 2015-09-20 DIAGNOSIS — N5201 Erectile dysfunction due to arterial insufficiency: Secondary | ICD-10-CM | POA: Diagnosis not present

## 2015-09-20 DIAGNOSIS — N35011 Post-traumatic bulbous urethral stricture: Secondary | ICD-10-CM | POA: Diagnosis not present

## 2015-09-24 ENCOUNTER — Ambulatory Visit (INDEPENDENT_AMBULATORY_CARE_PROVIDER_SITE_OTHER)
Admission: RE | Admit: 2015-09-24 | Discharge: 2015-09-24 | Disposition: A | Discharge status: Home / Self Care | Payer: Medicare Other | Source: Ambulatory Visit | Attending: Vascular Surgery | Admitting: Vascular Surgery

## 2015-09-24 ENCOUNTER — Ambulatory Visit (INDEPENDENT_AMBULATORY_CARE_PROVIDER_SITE_OTHER): Payer: Medicare Other | Admitting: Vascular Surgery

## 2015-09-24 ENCOUNTER — Other Ambulatory Visit: Payer: Self-pay

## 2015-09-24 ENCOUNTER — Encounter: Payer: Self-pay | Admitting: Vascular Surgery

## 2015-09-24 ENCOUNTER — Ambulatory Visit (HOSPITAL_COMMUNITY)
Admission: RE | Admit: 2015-09-24 | Discharge: 2015-09-24 | Disposition: A | Discharge status: Home / Self Care | Payer: Medicare Other | Source: Ambulatory Visit | Attending: Vascular Surgery | Admitting: Vascular Surgery

## 2015-09-24 VITALS — BP 167/77 | HR 83 | Temp 97.8°F | Resp 16 | Ht 72.0 in | Wt 260.0 lb

## 2015-09-24 DIAGNOSIS — Z01810 Encounter for preprocedural cardiovascular examination: Secondary | ICD-10-CM | POA: Diagnosis not present

## 2015-09-24 DIAGNOSIS — N183 Chronic kidney disease, stage 3 unspecified: Secondary | ICD-10-CM

## 2015-09-24 DIAGNOSIS — N184 Chronic kidney disease, stage 4 (severe): Secondary | ICD-10-CM

## 2015-09-24 NOTE — Progress Notes (Signed)
Filed Vitals:   09/24/15 1106 09/24/15 1110  BP: 161/77 167/77  Pulse: 83 83  Temp: 97.8 F (36.6 C)   Resp: 16   Height: 6' (1.829 m)   Weight: 260 lb (117.935 kg)   SpO2: 96%

## 2015-09-24 NOTE — Progress Notes (Signed)
Subjective:     Patient ID: [name] [name], male   DOB: 03/08/1966, 49 y.o.   MRN: [id number]  HPI this 49 year old male was referred by Dr. Jamal Maes for vascular access. Patient started on hemodialysis in 2005 through a left brachial-cephalic AV fistula. He had kidney transplant in 2009 which is now failing and he will likely need hemodialysis in the very near future. He also has a history of a failed right brachial-cephalic AV fistula. He has no history of coronary artery disease, myocardial infarction, CVA.  Past Medical History  Diagnosis Date  . Diabetes mellitus   . Hypertension   . Leg pain 02/05/11    with walking  . Weight gain   . Shortness of breath     when lying flat  . Diminished eyesight   . Hearing difficulty   . Chronic kidney disease     Dia;ysis MWF  . Anemia   . Nervousness(799.21)   . COPD (chronic obstructive pulmonary disease) ([place])   . CHF (congestive heart failure) ([place])   . Posttraumatic stress disorder   . Allergic rhinitis   . Dyslipidemia   . Morbid obesity ([place])   . Diabetic peripheral neuropathy ([place])   . Gait disorder   . Obstructive sleep apnea   . Bilateral carpal tunnel syndrome   . Anxiety disorder   . Gastroparesis   . Gout   . Diabetic retinopathy ([place])   . Renal transplant, status post 04/05/2014  . Incomplete bladder emptying   . Urethral stricture   . Aortic insufficiency     a. Echo 7/16:  Mod LVH, EF 50-55%, mild to mod AI, severe LAE, PASP 57 mmHg (LV ID end diastolic Q000111Q mm)  . Hyperlipidemia   . Osteomyelitis ([place])   . Endocarditis   . Chronic renal allograft nephropathy   . Vitamin D deficiency   . Secondary hyperparathyroidism ([place])   . Rotator cuff disorder   . Discitis of lumbar region     resolved    Social History  Substance Use Topics  . Smoking status: Never Smoker   . Smokeless tobacco: Never Used  . Alcohol Use: No    Family History  Problem Relation Age of Onset  . Hypertension Mother   . Diabetes  Father     Allergies  Allergen Reactions  . Penicillins Rash and Other (See Comments)    fever  . Pork-Derived Products Anaphylaxis and Other (See Comments)    Fever   . Ace Inhibitors Cough     Current outpatient prescriptions:  .  ACCU-CHEK FASTCLIX LANCETS MISC, Use to check blood sugar 4 times per day dx code E11.9, Disp: 408 each, Rfl: 1 .  aspirin 325 MG tablet, Take 81 mg by mouth daily. , Disp: , Rfl:  .  calcitRIOL (ROCALTROL) 0.5 MCG capsule, Take 0.5 mcg by mouth daily. Takes 3 tablets daily., Disp: , Rfl:  .  clonazePAM (KLONOPIN) 0.5 MG tablet, Take 1 mg by mouth at bedtime. , Disp: , Rfl:  .  cloNIDine (CATAPRES) 0.3 MG tablet, Take 0.3 mg by mouth 3 (three) times daily. , Disp: , Rfl:  .  diazepam (VALIUM) 5 MG tablet, Take 1 tablet (5 mg total) by mouth 2 (two) times daily., Disp: 12 tablet, Rfl: 0 .  Febuxostat (ULORIC) 80 MG TABS, Take 80 mg by mouth daily., Disp: , Rfl:  .  fluconazole (DIFLUCAN) 50 MG tablet, Take 50 mg by mouth 3 (three) times a week. , Disp: , Rfl:  .  furosemide (LASIX) 80 MG tablet, Take 2 tablets by mouth 2 (two) times daily., Disp: , Rfl:  .  glucose blood (ACCU-CHEK SMARTVIEW) test strip, Use as instructed to check blood sugar 4 times per day dx code E11.9, Disp: 400 each, Rfl: 1 .  Insulin Glargine (TOUJEO SOLOSTAR) 300 UNIT/ML SOPN, Inject 90 Units into the skin 2 (two) times daily., Disp: 30 mL, Rfl: 1 .  Insulin Pen Needle (BD PEN NEEDLE NANO U/F) 32G X 4 MM MISC, Use 5 per day, Disp: 450 each, Rfl: 1 .  insulin regular human CONCENTRATED (HUMULIN R U-500 KWIKPEN) 500 UNIT/ML kwikpen, Inject 10 Units into the skin once. U-500 insulin 10 before bfst, 25 before lunch and 20 before dinner, take 30 min before Sliding scale use for high sugars:  Sugar 150-199 take 2 units, 200-250= 4 units and over 250 take 6 units, Disp: 30 pen, Rfl: 1 .  mycophenolate (CELLCEPT) 250 MG capsule, Take 750 mg by mouth 2 (two) times daily. , Disp: , Rfl:  .   omeprazole (PRILOSEC) 20 MG capsule, Take 20 mg by mouth daily., Disp: , Rfl:  .  ondansetron (ZOFRAN) 8 MG tablet, Take 1 tablet (8 mg total) by mouth every 8 (eight) hours as needed for nausea or vomiting., Disp: 12 tablet, Rfl: 0 .  predniSONE (DELTASONE) 5 MG tablet, Take 5 mg by mouth daily.  , Disp: , Rfl:  .  pregabalin (LYRICA) 100 MG capsule, Take 100 mg by mouth every evening. , Disp: , Rfl:  .  rosuvastatin (CRESTOR) 20 MG tablet, Take 1 tablet (20 mg total) by mouth at bedtime., Disp: 90 tablet, Rfl: 3 .  sulfamethoxazole-trimethoprim (BACTRIM,SEPTRA) 400-80 MG tablet, Take 1 tablet by mouth 3 (three) times a week. Takes 1 tablet M/W/F., Disp: , Rfl:  .  tacrolimus (PROGRAF) 0.5 MG capsule, Take 2.5 mg by mouth 2 (two) times daily. Takes 5 capsules 2 times a day, Disp: , Rfl:  .  tadalafil (CIALIS) 5 MG tablet, Take 5 mg by mouth daily as needed for erectile dysfunction., Disp: , Rfl:  .  valGANciclovir (VALCYTE) 450 MG tablet, Take 450 mg by mouth. Takes 1 tablet  M/W/F., Disp: , Rfl:  .  HYDROcodone-acetaminophen (NORCO/VICODIN) 5-325 MG tablet, Take 1-2 tablets by mouth every 6 (six) hours as needed for severe pain. (Patient not taking: Reported on 09/24/2015), Disp: 28 tablet, Rfl: 0  Filed Vitals:   09/24/15 1106 09/24/15 1110  BP: 161/77 167/77  Pulse: 83 83  Temp: 97.8 F (36.6 C)   Resp: 16   Height: 6' (1.829 m)   Weight: 260 lb (117.935 kg)   SpO2: 96%     Body mass index is 35.25 kg/(m^2).           Review of Systems patient has diminished eyesight, history of hypertension, diabetes mellitus type 1, essentially nonambulatory during the day with a motorized scooter but does walk some in the evenings at home. On chronic steroids for kidney transplant. See history of present illness. Other systems negative and complete review of symptoms     Objective:   Physical Exam BP 167/77 mmHg  Pulse 83  Temp(Src) 97.8 F (36.6 C)  Resp 16  Ht 6' (1.829 m)  Wt 260  lb (117.935 kg)  BMI 35.25 kg/m2  SpO2 96%  Gen.-alert and oriented x3 in no apparent distress-obese-on motorized wheelchair HEENT normal for age Lungs no rhonchi or wheezing Cardiovascular regular rhythm no murmurs carotid pulses 3+ palpable no bruits  audible Abdomen soft nontender no palpable masses-obese Musculoskeletal free of  major deformities Skin clear -no rashes Neurologic normal Lower extremities 3+ femoral and dorsalis pedis pulses palpable bilaterally Right upper extremity with 3+ brachial and radial pulse palpable. Evidence of thrombosed brachial-cephalic AV fistula.  Today now ordered bilateral vein mapping upper extremity and arterial study. The arterial study is normal bilaterally. Vein mapping reveals the only remaining vein in either upper extremity is the basilic vein on the right which appears to have adequate caliber       Assessment:     Chronic kidney disease stage IV with failing renal transplant Needs right basilic vein transposition under general anesthesia    Plan:     Plan right basilic vein transposition this Friday, December 16 at [facility] as an outpatient This and benefits thoroughly discussed with patient and he would like to proceed

## 2015-09-26 ENCOUNTER — Encounter (HOSPITAL_COMMUNITY): Payer: Self-pay | Admitting: *Deleted

## 2015-09-26 ENCOUNTER — Other Ambulatory Visit: Payer: Self-pay | Admitting: *Deleted

## 2015-09-26 MED ORDER — VANCOMYCIN HCL 10 G IV SOLR
1500.0000 mg | INTRAVENOUS | Status: DC
  Filled 2015-09-26: qty 1500

## 2015-09-26 MED ORDER — VANCOMYCIN HCL 10 G IV SOLR
1500.0000 mg | INTRAVENOUS | Status: AC
  Administered 2015-09-27: 1500 mg via INTRAVENOUS
  Filled 2015-09-26: qty 1500

## 2015-09-26 NOTE — Progress Notes (Addendum)
[name] [name] was diagnosed with endocarditis 11/28/14.  Patient was schedule for Aortic valve replacement 02/25/16, this did not happen.   From Dr Angelena Form notes 05/2015-Surgical correction was recommended after appropriate IV antibiotics. The patient did not tolerate cardiac catheterization and this was never performed. He has missed several appointments at [facility] and follow-ups with the surgeons to schedule his surgery. The patient has been participating in physical therapy. He followed up with infectious disease last month at [facility]. Follow-up blood cultures 02/27/15 demonstrated no growth. Echo 04/17/15 with normal LV function and mild to moderate AI. He has completed antibiotics.   [name] [name] denies chest pain or shortness of breath.  [name] [name] reports that he was instructed at Dr Evelena Leyden office to not take any Insulin am.  I instructed patient to take 72 units of Insulin tonight.  Patient reported that if CBG is 70 or less that he drinks juice.  I asked patient to drink only 1.2 cup of clear juice - apple or cranberry and to recheck it in 15 minutes, note times and call pre op.

## 2015-09-26 NOTE — Progress Notes (Signed)
Dr Al Corpus notified of patients cardiac events this year, endo carditis, severe aortic stenosis in February.  Patient was scheduled for Aortic valve replacement, missed appointments and did not have valve replacement. Patient was seen by Dr Thayer Ohm in July, Echo was repeated- which showed normal LV function and mid to moderate Aortic Insuffiency.  No new orders from Dr Al Corpus.

## 2015-09-27 ENCOUNTER — Encounter (HOSPITAL_COMMUNITY)
Admission: RE | Disposition: A | Discharge status: Home / Self Care | Payer: Self-pay | Source: Ambulatory Visit | Attending: Vascular Surgery

## 2015-09-27 ENCOUNTER — Encounter (HOSPITAL_COMMUNITY): Payer: Self-pay | Admitting: *Deleted

## 2015-09-27 ENCOUNTER — Ambulatory Visit (HOSPITAL_COMMUNITY): Payer: Medicare Other | Admitting: Certified Registered Nurse Anesthetist

## 2015-09-27 ENCOUNTER — Observation Stay (HOSPITAL_COMMUNITY)
Admission: RE | Admit: 2015-09-27 | Discharge: 2015-09-28 | Disposition: A | Discharge status: Home / Self Care | Payer: Medicare Other | Source: Ambulatory Visit | Attending: Vascular Surgery | Admitting: Vascular Surgery

## 2015-09-27 DIAGNOSIS — M79606 Pain in leg, unspecified: Secondary | ICD-10-CM | POA: Diagnosis not present

## 2015-09-27 DIAGNOSIS — R339 Retention of urine, unspecified: Secondary | ICD-10-CM | POA: Insufficient documentation

## 2015-09-27 DIAGNOSIS — E11319 Type 2 diabetes mellitus with unspecified diabetic retinopathy without macular edema: Secondary | ICD-10-CM | POA: Insufficient documentation

## 2015-09-27 DIAGNOSIS — Z992 Dependence on renal dialysis: Secondary | ICD-10-CM | POA: Insufficient documentation

## 2015-09-27 DIAGNOSIS — I129 Hypertensive chronic kidney disease with stage 1 through stage 4 chronic kidney disease, or unspecified chronic kidney disease: Secondary | ICD-10-CM | POA: Diagnosis present

## 2015-09-27 DIAGNOSIS — J449 Chronic obstructive pulmonary disease, unspecified: Secondary | ICD-10-CM | POA: Diagnosis not present

## 2015-09-27 DIAGNOSIS — G4733 Obstructive sleep apnea (adult) (pediatric): Secondary | ICD-10-CM | POA: Diagnosis not present

## 2015-09-27 DIAGNOSIS — Z7982 Long term (current) use of aspirin: Secondary | ICD-10-CM | POA: Diagnosis not present

## 2015-09-27 DIAGNOSIS — Z888 Allergy status to other drugs, medicaments and biological substances status: Secondary | ICD-10-CM | POA: Diagnosis not present

## 2015-09-27 DIAGNOSIS — Z940 Kidney transplant status: Secondary | ICD-10-CM | POA: Insufficient documentation

## 2015-09-27 DIAGNOSIS — Z794 Long term (current) use of insulin: Secondary | ICD-10-CM | POA: Diagnosis not present

## 2015-09-27 DIAGNOSIS — I130 Hypertensive heart and chronic kidney disease with heart failure and stage 1 through stage 4 chronic kidney disease, or unspecified chronic kidney disease: Secondary | ICD-10-CM | POA: Diagnosis not present

## 2015-09-27 DIAGNOSIS — N2581 Secondary hyperparathyroidism of renal origin: Secondary | ICD-10-CM | POA: Insufficient documentation

## 2015-09-27 DIAGNOSIS — G5603 Carpal tunnel syndrome, bilateral upper limbs: Secondary | ICD-10-CM | POA: Diagnosis not present

## 2015-09-27 DIAGNOSIS — E559 Vitamin D deficiency, unspecified: Secondary | ICD-10-CM | POA: Insufficient documentation

## 2015-09-27 DIAGNOSIS — I509 Heart failure, unspecified: Secondary | ICD-10-CM | POA: Diagnosis not present

## 2015-09-27 DIAGNOSIS — M109 Gout, unspecified: Secondary | ICD-10-CM | POA: Diagnosis not present

## 2015-09-27 DIAGNOSIS — E1143 Type 2 diabetes mellitus with diabetic autonomic (poly)neuropathy: Secondary | ICD-10-CM | POA: Insufficient documentation

## 2015-09-27 DIAGNOSIS — Z91018 Allergy to other foods: Secondary | ICD-10-CM | POA: Insufficient documentation

## 2015-09-27 DIAGNOSIS — E785 Hyperlipidemia, unspecified: Secondary | ICD-10-CM | POA: Diagnosis not present

## 2015-09-27 DIAGNOSIS — K219 Gastro-esophageal reflux disease without esophagitis: Secondary | ICD-10-CM | POA: Insufficient documentation

## 2015-09-27 DIAGNOSIS — Z880 Allergy status to penicillin: Secondary | ICD-10-CM | POA: Insufficient documentation

## 2015-09-27 DIAGNOSIS — K3184 Gastroparesis: Secondary | ICD-10-CM | POA: Insufficient documentation

## 2015-09-27 DIAGNOSIS — F4310 Post-traumatic stress disorder, unspecified: Secondary | ICD-10-CM | POA: Diagnosis not present

## 2015-09-27 DIAGNOSIS — N184 Chronic kidney disease, stage 4 (severe): Secondary | ICD-10-CM | POA: Insufficient documentation

## 2015-09-27 DIAGNOSIS — R0602 Shortness of breath: Secondary | ICD-10-CM | POA: Diagnosis not present

## 2015-09-27 DIAGNOSIS — N186 End stage renal disease: Secondary | ICD-10-CM

## 2015-09-27 HISTORY — PX: BASCILIC VEIN TRANSPOSITION: SHX5742

## 2015-09-27 HISTORY — DX: Sepsis, unspecified organism: A41.9

## 2015-09-27 HISTORY — DX: Claustrophobia: F40.240

## 2015-09-27 HISTORY — DX: Gastro-esophageal reflux disease without esophagitis: K21.9

## 2015-09-27 LAB — POCT I-STAT 4, (NA,K, GLUC, HGB,HCT)
Glucose, Bld: 110 mg/dL — ABNORMAL HIGH (ref 65–99)
HCT: 31 % — ABNORMAL LOW (ref 39.0–52.0)
Hemoglobin: 10.5 g/dL — ABNORMAL LOW (ref 13.0–17.0)
Potassium: 4.3 mmol/L (ref 3.5–5.1)
Sodium: 144 mmol/L (ref 135–145)

## 2015-09-27 LAB — GLUCOSE, CAPILLARY
Glucose-Capillary: 96 mg/dL (ref 65–99)
Glucose-Capillary: 95 mg/dL (ref 65–99)
Glucose-Capillary: 125 mg/dL — ABNORMAL HIGH (ref 65–99)
Glucose-Capillary: 132 mg/dL — ABNORMAL HIGH (ref 65–99)
Glucose-Capillary: 209 mg/dL — ABNORMAL HIGH (ref 65–99)

## 2015-09-27 SURGERY — TRANSPOSITION, VEIN, BASILIC
Anesthesia: Choice | Laterality: Right

## 2015-09-27 MED ORDER — ONDANSETRON HCL 4 MG/2ML IJ SOLN
INTRAMUSCULAR | Status: AC
  Filled 2015-09-27: qty 4

## 2015-09-27 MED ORDER — MEPERIDINE HCL 25 MG/ML IJ SOLN: 6.2500 mg | INTRAMUSCULAR | Status: DC | PRN

## 2015-09-27 MED ORDER — EPHEDRINE SULFATE 50 MG/ML IJ SOLN
INTRAMUSCULAR | Status: DC | PRN
  Administered 2015-09-27: 10 mg via INTRAVENOUS
  Administered 2015-09-27 (×2): 5 mg via INTRAVENOUS

## 2015-09-27 MED ORDER — HYDROMORPHONE HCL 1 MG/ML IJ SOLN
INTRAMUSCULAR | Status: AC
  Administered 2015-09-27: 0.5 mg via INTRAVENOUS
  Filled 2015-09-27: qty 1

## 2015-09-27 MED ORDER — PROMETHAZINE HCL 25 MG/ML IJ SOLN
6.2500 mg | INTRAMUSCULAR | Status: DC | PRN
  Administered 2015-09-27: 6.25 mg via INTRAVENOUS

## 2015-09-27 MED ORDER — HYDROMORPHONE HCL 1 MG/ML IJ SOLN
0.2500 mg | INTRAMUSCULAR | Status: DC | PRN
  Administered 2015-09-27 (×3): 0.5 mg via INTRAVENOUS

## 2015-09-27 MED ORDER — POTASSIUM CHLORIDE CRYS ER 20 MEQ PO TBCR: 20.0000 meq | EXTENDED_RELEASE_TABLET | Freq: Once | ORAL | Status: DC

## 2015-09-27 MED ORDER — ONDANSETRON HCL 8 MG PO TABS
8.0000 mg | ORAL_TABLET | Freq: Three times a day (TID) | ORAL | Status: DC | PRN
  Filled 2015-09-27: qty 1

## 2015-09-27 MED ORDER — PREGABALIN 100 MG PO CAPS
100.0000 mg | ORAL_CAPSULE | Freq: Every evening | ORAL | Status: DC
  Administered 2015-09-27: 100 mg via ORAL
  Filled 2015-09-27: qty 4

## 2015-09-27 MED ORDER — SULFAMETHOXAZOLE-TRIMETHOPRIM 400-80 MG PO TABS
1.0000 | ORAL_TABLET | ORAL | Status: DC
  Administered 2015-09-27: 1 via ORAL
  Filled 2015-09-27: qty 1

## 2015-09-27 MED ORDER — PHENYLEPHRINE 40 MCG/ML (10ML) SYRINGE FOR IV PUSH (FOR BLOOD PRESSURE SUPPORT)
PREFILLED_SYRINGE | INTRAVENOUS | Status: AC
  Filled 2015-09-27: qty 10

## 2015-09-27 MED ORDER — MIDAZOLAM HCL 5 MG/5ML IJ SOLN
INTRAMUSCULAR | Status: DC | PRN
  Administered 2015-09-27 (×2): 1 mg via INTRAVENOUS

## 2015-09-27 MED ORDER — OXYCODONE HCL 5 MG PO TABS
5.0000 mg | ORAL_TABLET | ORAL | Status: DC | PRN
  Administered 2015-09-27 – 2015-09-28 (×2): 10 mg via ORAL
  Filled 2015-09-27 (×2): qty 2

## 2015-09-27 MED ORDER — ONDANSETRON HCL 4 MG/2ML IJ SOLN
4.0000 mg | Freq: Once | INTRAMUSCULAR | Status: AC | PRN
  Administered 2015-09-27: 4 mg via INTRAVENOUS

## 2015-09-27 MED ORDER — VALGANCICLOVIR HCL 450 MG PO TABS
450.0000 mg | ORAL_TABLET | Freq: Every day | ORAL | Status: DC
  Administered 2015-09-27 – 2015-09-28 (×2): 450 mg via ORAL
  Filled 2015-09-27 (×2): qty 1

## 2015-09-27 MED ORDER — TACROLIMUS 0.5 MG PO CAPS
2.5000 mg | ORAL_CAPSULE | Freq: Two times a day (BID) | ORAL | Status: DC
  Administered 2015-09-27 – 2015-09-28 (×2): 2.5 mg via ORAL
  Filled 2015-09-27 (×3): qty 1

## 2015-09-27 MED ORDER — SODIUM CHLORIDE 0.9 % IJ SOLN
INTRAMUSCULAR | Status: DC | PRN
  Administered 2015-09-27: 10 mL via INTRAVENOUS

## 2015-09-27 MED ORDER — LIDOCAINE HCL (CARDIAC) 20 MG/ML IV SOLN
INTRAVENOUS | Status: AC
  Filled 2015-09-27: qty 5

## 2015-09-27 MED ORDER — CALCITRIOL 0.5 MCG PO CAPS
1.5000 ug | ORAL_CAPSULE | Freq: Every day | ORAL | Status: DC
  Administered 2015-09-28: 1.5 ug via ORAL
  Filled 2015-09-27: qty 3

## 2015-09-27 MED ORDER — ASPIRIN 81 MG PO CHEW
81.0000 mg | CHEWABLE_TABLET | Freq: Every day | ORAL | Status: DC
  Administered 2015-09-28: 81 mg via ORAL
  Filled 2015-09-27: qty 1

## 2015-09-27 MED ORDER — ACETAMINOPHEN 325 MG RE SUPP: 325.0000 mg | RECTAL | Status: DC | PRN

## 2015-09-27 MED ORDER — MIDAZOLAM HCL 2 MG/2ML IJ SOLN
INTRAMUSCULAR | Status: AC
  Filled 2015-09-27: qty 2

## 2015-09-27 MED ORDER — ONDANSETRON HCL 4 MG/2ML IJ SOLN
INTRAMUSCULAR | Status: AC
  Administered 2015-09-27: 4 mg via INTRAVENOUS
  Filled 2015-09-27: qty 2

## 2015-09-27 MED ORDER — PHENYLEPHRINE HCL 10 MG/ML IJ SOLN
INTRAMUSCULAR | Status: DC | PRN
  Administered 2015-09-27: 80 ug via INTRAVENOUS
  Administered 2015-09-27 (×3): 40 ug via INTRAVENOUS
  Administered 2015-09-27: 80 ug via INTRAVENOUS

## 2015-09-27 MED ORDER — PROMETHAZINE HCL 25 MG/ML IJ SOLN
INTRAMUSCULAR | Status: AC
  Administered 2015-09-27: 6.25 mg via INTRAVENOUS
  Filled 2015-09-27: qty 1

## 2015-09-27 MED ORDER — SODIUM CHLORIDE 0.9 % IV SOLN: INTRAVENOUS | Status: DC

## 2015-09-27 MED ORDER — CHLORHEXIDINE GLUCONATE CLOTH 2 % EX PADS: 6.0000 | MEDICATED_PAD | Freq: Once | CUTANEOUS | Status: DC

## 2015-09-27 MED ORDER — INSULIN GLARGINE 300 UNIT/ML ~~LOC~~ SOPN: 90.0000 [IU] | PEN_INJECTOR | Freq: Two times a day (BID) | SUBCUTANEOUS | Status: DC

## 2015-09-27 MED ORDER — HYDROMORPHONE HCL 1 MG/ML IJ SOLN: 0.2500 mg | INTRAMUSCULAR | Status: DC | PRN

## 2015-09-27 MED ORDER — SUCCINYLCHOLINE CHLORIDE 20 MG/ML IJ SOLN
INTRAMUSCULAR | Status: AC
  Filled 2015-09-27: qty 1

## 2015-09-27 MED ORDER — EPHEDRINE SULFATE 50 MG/ML IJ SOLN
INTRAMUSCULAR | Status: AC
  Filled 2015-09-27: qty 1

## 2015-09-27 MED ORDER — LABETALOL HCL 5 MG/ML IV SOLN
10.0000 mg | INTRAVENOUS | Status: DC | PRN
Start: 2015-09-27 — End: 2015-09-28
  Administered 2015-09-28: 10 mg via INTRAVENOUS
  Filled 2015-09-27: qty 4

## 2015-09-27 MED ORDER — INSULIN REGULAR HUMAN (CONC) 500 UNIT/ML ~~LOC~~ SOPN: 10.0000 [IU] | PEN_INJECTOR | Freq: Once | SUBCUTANEOUS | Status: DC

## 2015-09-27 MED ORDER — METOPROLOL TARTRATE 1 MG/ML IV SOLN: 2.0000 mg | INTRAVENOUS | Status: DC | PRN

## 2015-09-27 MED ORDER — ALUM & MAG HYDROXIDE-SIMETH 200-200-20 MG/5ML PO SUSP: 15.0000 mL | ORAL | Status: DC | PRN

## 2015-09-27 MED ORDER — SODIUM CHLORIDE 0.9 % IV SOLN
INTRAVENOUS | Status: DC
  Administered 2015-09-27 (×3): via INTRAVENOUS

## 2015-09-27 MED ORDER — CLONIDINE HCL 0.3 MG PO TABS
0.3000 mg | ORAL_TABLET | Freq: Three times a day (TID) | ORAL | Status: DC
  Administered 2015-09-27 – 2015-09-28 (×2): 0.3 mg via ORAL
  Filled 2015-09-27 (×2): qty 1
  Filled 2015-09-27: qty 3
  Filled 2015-09-27 (×3): qty 1

## 2015-09-27 MED ORDER — PROPOFOL 10 MG/ML IV BOLUS
INTRAVENOUS | Status: DC | PRN
  Administered 2015-09-27 (×2): 50 mg via INTRAVENOUS
  Administered 2015-09-27: 200 mg via INTRAVENOUS

## 2015-09-27 MED ORDER — HYDRALAZINE HCL 20 MG/ML IJ SOLN
5.0000 mg | INTRAMUSCULAR | Status: DC | PRN
Start: 2015-09-27 — End: 2015-09-28

## 2015-09-27 MED ORDER — GUAIFENESIN-DM 100-10 MG/5ML PO SYRP: 15.0000 mL | ORAL_SOLUTION | ORAL | Status: DC | PRN

## 2015-09-27 MED ORDER — PHENYLEPHRINE 40 MCG/ML (10ML) SYRINGE FOR IV PUSH (FOR BLOOD PRESSURE SUPPORT)
PREFILLED_SYRINGE | INTRAVENOUS | Status: AC
  Filled 2015-09-27: qty 30

## 2015-09-27 MED ORDER — FENTANYL CITRATE (PF) 250 MCG/5ML IJ SOLN
INTRAMUSCULAR | Status: AC
  Filled 2015-09-27: qty 5

## 2015-09-27 MED ORDER — ACETAMINOPHEN 325 MG PO TABS
325.0000 mg | ORAL_TABLET | ORAL | Status: DC | PRN
Start: 2015-09-27 — End: 2015-09-28

## 2015-09-27 MED ORDER — FLUCONAZOLE 50 MG PO TABS
50.0000 mg | ORAL_TABLET | ORAL | Status: DC
  Administered 2015-09-27: 50 mg via ORAL
  Filled 2015-09-27: qty 1

## 2015-09-27 MED ORDER — LIDOCAINE HCL (CARDIAC) 20 MG/ML IV SOLN
INTRAVENOUS | Status: DC | PRN
  Administered 2015-09-27: 100 mg via INTRAVENOUS

## 2015-09-27 MED ORDER — SODIUM CHLORIDE 0.9 % IV SOLN: 250.0000 mL | INTRAVENOUS | Status: DC | PRN

## 2015-09-27 MED ORDER — CLONAZEPAM 1 MG PO TABS
1.0000 mg | ORAL_TABLET | Freq: Every day | ORAL | Status: DC
  Administered 2015-09-27: 1 mg via ORAL
  Filled 2015-09-27: qty 1

## 2015-09-27 MED ORDER — PREDNISONE 5 MG PO TABS
5.0000 mg | ORAL_TABLET | Freq: Every day | ORAL | Status: DC
  Administered 2015-09-27: 5 mg via ORAL
  Filled 2015-09-27 (×2): qty 1

## 2015-09-27 MED ORDER — SUCCINYLCHOLINE CHLORIDE 20 MG/ML IJ SOLN
INTRAMUSCULAR | Status: DC | PRN
  Administered 2015-09-27: 120 mg via INTRAVENOUS

## 2015-09-27 MED ORDER — FENTANYL CITRATE (PF) 100 MCG/2ML IJ SOLN
INTRAMUSCULAR | Status: DC | PRN
  Administered 2015-09-27: 100 ug via INTRAVENOUS
  Administered 2015-09-27 (×2): 25 ug via INTRAVENOUS

## 2015-09-27 MED ORDER — STERILE WATER FOR INJECTION IJ SOLN
INTRAMUSCULAR | Status: AC
  Filled 2015-09-27: qty 10

## 2015-09-27 MED ORDER — ONDANSETRON HCL 4 MG/2ML IJ SOLN: 4.0000 mg | Freq: Once | INTRAMUSCULAR | Status: DC | PRN

## 2015-09-27 MED ORDER — FUROSEMIDE 80 MG PO TABS
160.0000 mg | ORAL_TABLET | Freq: Two times a day (BID) | ORAL | Status: DC
  Administered 2015-09-27 – 2015-09-28 (×2): 160 mg via ORAL
  Filled 2015-09-27 (×2): qty 2

## 2015-09-27 MED ORDER — LIDOCAINE HCL (CARDIAC) 20 MG/ML IV SOLN
INTRAVENOUS | Status: AC
  Filled 2015-09-27: qty 10

## 2015-09-27 MED ORDER — SODIUM CHLORIDE 0.9 % IJ SOLN
3.0000 mL | Freq: Two times a day (BID) | INTRAMUSCULAR | Status: DC
  Administered 2015-09-27 – 2015-09-28 (×2): 3 mL via INTRAVENOUS

## 2015-09-27 MED ORDER — ONDANSETRON HCL 4 MG/2ML IJ SOLN
INTRAMUSCULAR | Status: DC | PRN
  Administered 2015-09-27: 4 mg via INTRAVENOUS

## 2015-09-27 MED ORDER — PANTOPRAZOLE SODIUM 40 MG PO TBEC
40.0000 mg | DELAYED_RELEASE_TABLET | Freq: Every day | ORAL | Status: DC
  Administered 2015-09-27 – 2015-09-28 (×2): 40 mg via ORAL
  Filled 2015-09-27 (×3): qty 1

## 2015-09-27 MED ORDER — ONDANSETRON HCL 4 MG/2ML IJ SOLN
4.0000 mg | Freq: Four times a day (QID) | INTRAMUSCULAR | Status: DC | PRN
  Administered 2015-09-27: 4 mg via INTRAVENOUS
  Filled 2015-09-27: qty 2

## 2015-09-27 MED ORDER — 0.9 % SODIUM CHLORIDE (POUR BTL) OPTIME
TOPICAL | Status: DC | PRN
Start: 2015-09-27 — End: 2015-09-27
  Administered 2015-09-27: 1000 mL

## 2015-09-27 MED ORDER — ROSUVASTATIN CALCIUM 20 MG PO TABS
20.0000 mg | ORAL_TABLET | Freq: Every day | ORAL | Status: DC
  Administered 2015-09-27: 20 mg via ORAL
  Filled 2015-09-27 (×2): qty 1

## 2015-09-27 MED ORDER — INSULIN GLARGINE 100 UNIT/ML ~~LOC~~ SOLN
70.0000 [IU] | Freq: Two times a day (BID) | SUBCUTANEOUS | Status: DC
  Administered 2015-09-27 – 2015-09-28 (×2): 70 [IU] via SUBCUTANEOUS
  Filled 2015-09-27 (×3): qty 0.7

## 2015-09-27 MED ORDER — DIAZEPAM 5 MG PO TABS
5.0000 mg | ORAL_TABLET | Freq: Two times a day (BID) | ORAL | Status: DC
  Administered 2015-09-27 – 2015-09-28 (×2): 5 mg via ORAL
  Filled 2015-09-27 (×2): qty 1

## 2015-09-27 MED ORDER — SODIUM CHLORIDE 0.9 % IJ SOLN
3.0000 mL | INTRAMUSCULAR | Status: DC | PRN
Start: 2015-09-27 — End: 2015-09-28

## 2015-09-27 MED ORDER — INSULIN ASPART 100 UNIT/ML ~~LOC~~ SOLN
0.0000 [IU] | Freq: Three times a day (TID) | SUBCUTANEOUS | Status: DC
  Administered 2015-09-28 (×2): 1 [IU] via SUBCUTANEOUS

## 2015-09-27 MED ORDER — PHENOL 1.4 % MT LIQD
1.0000 | OROMUCOSAL | Status: DC | PRN
  Filled 2015-09-27: qty 177

## 2015-09-27 MED ORDER — MYCOPHENOLATE MOFETIL 250 MG PO CAPS
750.0000 mg | ORAL_CAPSULE | Freq: Two times a day (BID) | ORAL | Status: DC
  Administered 2015-09-27 – 2015-09-28 (×2): 750 mg via ORAL
  Filled 2015-09-27 (×3): qty 3

## 2015-09-27 MED ORDER — HYDROCODONE-ACETAMINOPHEN 5-325 MG PO TABS: 1.0000 | ORAL_TABLET | Freq: Four times a day (QID) | ORAL | Status: DC | PRN

## 2015-09-27 SURGICAL SUPPLY — 32 items
BANDAGE ACE 4X5 VEL STRL LF (GAUZE/BANDAGES/DRESSINGS) ×2 IMPLANT
BLADE CLIPPER SURG (BLADE) ×2 IMPLANT
BNDG GAUZE ELAST 4 BULKY (GAUZE/BANDAGES/DRESSINGS) ×2 IMPLANT
CANISTER SUCTION 2500CC (MISCELLANEOUS) ×2 IMPLANT
CLIP TI MEDIUM 24 (CLIP) ×2 IMPLANT
CLIP TI WIDE RED SMALL 24 (CLIP) ×2 IMPLANT
COVER PROBE W GEL 5X96 (DRAPES) IMPLANT
ELECT REM PT RETURN 9FT ADLT (ELECTROSURGICAL) ×2
ELECTRODE REM PT RTRN 9FT ADLT (ELECTROSURGICAL) ×1 IMPLANT
GEL ULTRASOUND 20GR AQUASONIC (MISCELLANEOUS) IMPLANT
GLOVE BIO SURGEON STRL SZ 6.5 (GLOVE) ×8 IMPLANT
GLOVE BIO SURGEON STRL SZ7.5 (GLOVE) ×2 IMPLANT
GLOVE BIOGEL PI IND STRL 8 (GLOVE) ×1 IMPLANT
GLOVE BIOGEL PI INDICATOR 8 (GLOVE) ×1
GLOVE ECLIPSE 6.5 STRL STRAW (GLOVE) ×2 IMPLANT
GLOVE SS BIOGEL STRL SZ 7 (GLOVE) ×1 IMPLANT
GLOVE SUPERSENSE BIOGEL SZ 7 (GLOVE) ×1
GOWN STRL REUS W/ TWL LRG LVL3 (GOWN DISPOSABLE) ×3 IMPLANT
GOWN STRL REUS W/TWL LRG LVL3 (GOWN DISPOSABLE) ×3
KIT BASIN OR (CUSTOM PROCEDURE TRAY) ×2 IMPLANT
KIT ROOM TURNOVER OR (KITS) ×2 IMPLANT
LIQUID BAND (GAUZE/BANDAGES/DRESSINGS) ×2 IMPLANT
NS IRRIG 1000ML POUR BTL (IV SOLUTION) ×2 IMPLANT
PACK CV ACCESS (CUSTOM PROCEDURE TRAY) ×2 IMPLANT
PAD ARMBOARD 7.5X6 YLW CONV (MISCELLANEOUS) ×4 IMPLANT
SUT PROLENE 6 0 BV (SUTURE) ×4 IMPLANT
SUT SILK 2 0 SH (SUTURE) ×2 IMPLANT
SUT VIC AB 3-0 SH 27 (SUTURE) ×4
SUT VIC AB 3-0 SH 27X BRD (SUTURE) ×4 IMPLANT
SUT VICRYL 4-0 PS2 18IN ABS (SUTURE) ×2 IMPLANT
UNDERPAD 30X30 INCONTINENT (UNDERPADS AND DIAPERS) ×2 IMPLANT
WATER STERILE IRR 1000ML POUR (IV SOLUTION) ×2 IMPLANT

## 2015-09-27 NOTE — Anesthesia Procedure Notes (Signed)
Procedure Name: Intubation Date/Time: 09/27/2015 10:24 AM Performed by: Ollen Bowl Pre-anesthesia Checklist: Patient identified, Emergency Drugs available, Suction available, Patient being monitored and Timeout performed Patient Re-evaluated:Patient Re-evaluated prior to inductionOxygen Delivery Method: Circle system utilized and Simple face mask Preoxygenation: Pre-oxygenation with 100% oxygen Intubation Type: IV induction and Rapid sequence Ventilation: Mask ventilation without difficulty Laryngoscope Size: Miller and 3 Grade View: Grade I Tube type: Oral Tube size: 7.5 mm Number of attempts: 1 Airway Equipment and Method: Patient positioned with wedge pillow and Stylet Placement Confirmation: ETT inserted through vocal cords under direct vision,  positive ETCO2 and breath sounds checked- equal and bilateral Secured at: 23 cm Tube secured with: Tape Dental Injury: Teeth and Oropharynx as per pre-operative assessment

## 2015-09-27 NOTE — Discharge Instructions (Signed)
° ° °  09/27/2015 [name] [name] [id number] 1966/09/07  Surgeon(s): Mal Misty, MD  Procedure(s): BASILIC VEIN TRANSPOSITION right  x Do not stick fistula for 12 weeks

## 2015-09-27 NOTE — Op Note (Signed)
OPERATIVE REPORT  Date of Surgery: 09/27/2015  Surgeon: [name], MD  Assistant: Leontine Locket, PA  Pre-op Diagnosis: Stage IV Chronic Kidney Disease N18.4  Post-op Diagnosis: Stage IV Chronic Kidney Disease N18.4  Procedure: Procedure(s): BASILIC VEIN TRANSPOSITION right  Anesthesia: Gen. endotracheal  EBL: Minimal  Complications: None  Procedure Details: The patient was taken the operating room placed in supine position at which time satisfactory general endotracheal anesthesia was administered. The right upper extremity was then imaged using B mode ultrasound and the basilic vein was marked from the axilla to the proximal forearm. It was a large caliber vein. After prepping and draping routine sterile manner the basilic vein was exposed through multiple incisions along the medial aspect of the upper arm in the proximal forearm branches ligated with 3 and 4-0 silk ties and divided it was ligated distally transected gently dilated with heparinized saline and marked for orientation purposes. Was an excellent vein being up to 5 mm in size and since some areas. Through the distal upper arm incision the brachial artery was exposed where previous brachiocephalic fistula had been created which had been occluded for many years. The artery was dissected free for proximal and distal control as was the cephalic vein for about 3-4 cm. This portion of the vein was still pulsatile. A curvilinear tunnel was then created on the anterior aspect of the arm and the basilic vein was carefully delivered through the tunnel being careful to avoid any kinking. Artery was occluded proximally and distally with vessel loops the previous brachiocephalic fistula was transected leaving a minimal cuff of vein on the artery. There was excellent inflow present. Basilic vein was in transected and anastomosed end to side the brachial artery with 6-0 Prolene. Clamps released Excellent pulse and palpable thrill in the  fistula with good radial arterial flow distally. No heparin or protamine was given. Wounds irrigated with saline and closed in layers with Vicryl subcuticular fashion with Dermabond patient taken to recovery room in satisfactory condition   [name], MD 09/27/2015 12:18 PM

## 2015-09-27 NOTE — Anesthesia Postprocedure Evaluation (Signed)
Anesthesia Post Note  Patient: [name] [name]  Procedure(s) Performed: Procedure(s) (LRB): BASILIC VEIN TRANSPOSITION right (Right)  Patient location during evaluation: PACU Anesthesia Type: General Level of consciousness: awake and alert Pain management: pain level controlled Vital Signs Assessment: post-procedure vital signs reviewed and stable Respiratory status: spontaneous breathing, nonlabored ventilation, respiratory function stable and patient connected to nasal cannula oxygen Cardiovascular status: blood pressure returned to baseline and stable Postop Assessment: no signs of nausea or vomiting Anesthetic complications: no Comments: Nad nausea treated in PACU. Does not have a companion tonight - admitted for 23hrs.    Last Vitals:  Filed Vitals:   09/27/15 1350 09/27/15 1405  BP: 138/42 151/44  Pulse: 84 82  Temp:    Resp: 17 15    Last Pain:  Filed Vitals:   09/27/15 1408  PainSc: 2                  [name] DAVID

## 2015-09-27 NOTE — H&P (View-Only) (Signed)
Subjective:     Patient ID: [name] [name], male   DOB: 12-23-1965, 49 y.o.   MRN: [id number]  HPI this 49 year old male was referred by Dr. Jamal Maes for vascular access. Patient started on hemodialysis in 2005 through a left brachial-cephalic AV fistula. He had kidney transplant in 2009 which is now failing and he will likely need hemodialysis in the very near future. He also has a history of a failed right brachial-cephalic AV fistula. He has no history of coronary artery disease, myocardial infarction, CVA.  Past Medical History  Diagnosis Date  . Diabetes mellitus   . Hypertension   . Leg pain 02/05/11    with walking  . Weight gain   . Shortness of breath     when lying flat  . Diminished eyesight   . Hearing difficulty   . Chronic kidney disease     Dia;ysis MWF  . Anemia   . Nervousness(799.21)   . COPD (chronic obstructive pulmonary disease) ([place])   . CHF (congestive heart failure) ([place])   . Posttraumatic stress disorder   . Allergic rhinitis   . Dyslipidemia   . Morbid obesity ([place])   . Diabetic peripheral neuropathy ([place])   . Gait disorder   . Obstructive sleep apnea   . Bilateral carpal tunnel syndrome   . Anxiety disorder   . Gastroparesis   . Gout   . Diabetic retinopathy ([place])   . Renal transplant, status post 04/05/2014  . Incomplete bladder emptying   . Urethral stricture   . Aortic insufficiency     a. Echo 7/16:  Mod LVH, EF 50-55%, mild to mod AI, severe LAE, PASP 57 mmHg (LV ID end diastolic Q000111Q mm)  . Hyperlipidemia   . Osteomyelitis ([place])   . Endocarditis   . Chronic renal allograft nephropathy   . Vitamin D deficiency   . Secondary hyperparathyroidism ([place])   . Rotator cuff disorder   . Discitis of lumbar region     resolved    Social History  Substance Use Topics  . Smoking status: Never Smoker   . Smokeless tobacco: Never Used  . Alcohol Use: No    Family History  Problem Relation Age of Onset  . Hypertension Mother   . Diabetes  Father     Allergies  Allergen Reactions  . Penicillins Rash and Other (See Comments)    fever  . Pork-Derived Products Anaphylaxis and Other (See Comments)    Fever   . Ace Inhibitors Cough     Current outpatient prescriptions:  .  ACCU-CHEK FASTCLIX LANCETS MISC, Use to check blood sugar 4 times per day dx code E11.9, Disp: 408 each, Rfl: 1 .  aspirin 325 MG tablet, Take 81 mg by mouth daily. , Disp: , Rfl:  .  calcitRIOL (ROCALTROL) 0.5 MCG capsule, Take 0.5 mcg by mouth daily. Takes 3 tablets daily., Disp: , Rfl:  .  clonazePAM (KLONOPIN) 0.5 MG tablet, Take 1 mg by mouth at bedtime. , Disp: , Rfl:  .  cloNIDine (CATAPRES) 0.3 MG tablet, Take 0.3 mg by mouth 3 (three) times daily. , Disp: , Rfl:  .  diazepam (VALIUM) 5 MG tablet, Take 1 tablet (5 mg total) by mouth 2 (two) times daily., Disp: 12 tablet, Rfl: 0 .  Febuxostat (ULORIC) 80 MG TABS, Take 80 mg by mouth daily., Disp: , Rfl:  .  fluconazole (DIFLUCAN) 50 MG tablet, Take 50 mg by mouth 3 (three) times a week. , Disp: , Rfl:  .  furosemide (LASIX) 80 MG tablet, Take 2 tablets by mouth 2 (two) times daily., Disp: , Rfl:  .  glucose blood (ACCU-CHEK SMARTVIEW) test strip, Use as instructed to check blood sugar 4 times per day dx code E11.9, Disp: 400 each, Rfl: 1 .  Insulin Glargine (TOUJEO SOLOSTAR) 300 UNIT/ML SOPN, Inject 90 Units into the skin 2 (two) times daily., Disp: 30 mL, Rfl: 1 .  Insulin Pen Needle (BD PEN NEEDLE NANO U/F) 32G X 4 MM MISC, Use 5 per day, Disp: 450 each, Rfl: 1 .  insulin regular human CONCENTRATED (HUMULIN R U-500 KWIKPEN) 500 UNIT/ML kwikpen, Inject 10 Units into the skin once. U-500 insulin 10 before bfst, 25 before lunch and 20 before dinner, take 30 min before Sliding scale use for high sugars:  Sugar 150-199 take 2 units, 200-250= 4 units and over 250 take 6 units, Disp: 30 pen, Rfl: 1 .  mycophenolate (CELLCEPT) 250 MG capsule, Take 750 mg by mouth 2 (two) times daily. , Disp: , Rfl:  .   omeprazole (PRILOSEC) 20 MG capsule, Take 20 mg by mouth daily., Disp: , Rfl:  .  ondansetron (ZOFRAN) 8 MG tablet, Take 1 tablet (8 mg total) by mouth every 8 (eight) hours as needed for nausea or vomiting., Disp: 12 tablet, Rfl: 0 .  predniSONE (DELTASONE) 5 MG tablet, Take 5 mg by mouth daily.  , Disp: , Rfl:  .  pregabalin (LYRICA) 100 MG capsule, Take 100 mg by mouth every evening. , Disp: , Rfl:  .  rosuvastatin (CRESTOR) 20 MG tablet, Take 1 tablet (20 mg total) by mouth at bedtime., Disp: 90 tablet, Rfl: 3 .  sulfamethoxazole-trimethoprim (BACTRIM,SEPTRA) 400-80 MG tablet, Take 1 tablet by mouth 3 (three) times a week. Takes 1 tablet M/W/F., Disp: , Rfl:  .  tacrolimus (PROGRAF) 0.5 MG capsule, Take 2.5 mg by mouth 2 (two) times daily. Takes 5 capsules 2 times a day, Disp: , Rfl:  .  tadalafil (CIALIS) 5 MG tablet, Take 5 mg by mouth daily as needed for erectile dysfunction., Disp: , Rfl:  .  valGANciclovir (VALCYTE) 450 MG tablet, Take 450 mg by mouth. Takes 1 tablet  M/W/F., Disp: , Rfl:  .  HYDROcodone-acetaminophen (NORCO/VICODIN) 5-325 MG tablet, Take 1-2 tablets by mouth every 6 (six) hours as needed for severe pain. (Patient not taking: Reported on 09/24/2015), Disp: 28 tablet, Rfl: 0  Filed Vitals:   09/24/15 1106 09/24/15 1110  BP: 161/77 167/77  Pulse: 83 83  Temp: 97.8 F (36.6 C)   Resp: 16   Height: 6' (1.829 m)   Weight: 260 lb (117.935 kg)   SpO2: 96%     Body mass index is 35.25 kg/(m^2).           Review of Systems patient has diminished eyesight, history of hypertension, diabetes mellitus type 1, essentially nonambulatory during the day with a motorized scooter but does walk some in the evenings at home. On chronic steroids for kidney transplant. See history of present illness. Other systems negative and complete review of symptoms     Objective:   Physical Exam BP 167/77 mmHg  Pulse 83  Temp(Src) 97.8 F (36.6 C)  Resp 16  Ht 6' (1.829 m)  Wt 260  lb (117.935 kg)  BMI 35.25 kg/m2  SpO2 96%  Gen.-alert and oriented x3 in no apparent distress-obese-on motorized wheelchair HEENT normal for age Lungs no rhonchi or wheezing Cardiovascular regular rhythm no murmurs carotid pulses 3+ palpable no bruits  audible Abdomen soft nontender no palpable masses-obese Musculoskeletal free of  major deformities Skin clear -no rashes Neurologic normal Lower extremities 3+ femoral and dorsalis pedis pulses palpable bilaterally Right upper extremity with 3+ brachial and radial pulse palpable. Evidence of thrombosed brachial-cephalic AV fistula.  Today now ordered bilateral vein mapping upper extremity and arterial study. The arterial study is normal bilaterally. Vein mapping reveals the only remaining vein in either upper extremity is the basilic vein on the right which appears to have adequate caliber       Assessment:     Chronic kidney disease stage IV with failing renal transplant Needs right basilic vein transposition under general anesthesia    Plan:     Plan right basilic vein transposition this Friday, December 16 at [facility] as an outpatient This and benefits thoroughly discussed with patient and he would like to proceed

## 2015-09-27 NOTE — Interval H&P Note (Signed)
History and Physical Interval Note:  09/27/2015 9:27 AM  [name] [name]  has presented today for surgery, with the diagnosis of Stage IV Chronic Kidney Disease N18.4  The various methods of treatment have been discussed with the patient and family. After consideration of risks, benefits and other options for treatment, the patient has consented to  Procedure(s): BASILIC VEIN TRANSPOSITION (Right) as a surgical intervention .  The patient's history has been reviewed, patient examined, no change in status, stable for surgery.  I have reviewed the patient's chart and labs.  Questions were answered to the patient's satisfaction.     [name]

## 2015-09-27 NOTE — Anesthesia Preprocedure Evaluation (Addendum)
Anesthesia Evaluation  Patient identified by MRN, date of birth, ID band Patient awake    Reviewed: Allergy & Precautions, NPO status , Patient's Chart, lab work & pertinent test results, reviewed documented beta blocker date and time   Airway Mallampati: II  TM Distance: >3 FB Neck ROM: Full    Dental  (+) Teeth Intact, Dental Advisory Given   Pulmonary shortness of breath, COPD,    Pulmonary exam normal        Cardiovascular hypertension, Pt. on medications and Pt. on home beta blockers Normal cardiovascular exam+ Valvular Problems/Murmurs AI      Neuro/Psych Anxiety    GI/Hepatic GERD  Medicated,  Endo/Other  diabetes, Type 2  Renal/GU ESRFRenal disease     Musculoskeletal  (+) Arthritis ,   Abdominal   Peds  Hematology  (+) anemia ,   Anesthesia Other Findings   Reproductive/Obstetrics                            Anesthesia Physical Anesthesia Plan  ASA: III  Anesthesia Plan: General   Post-op Pain Management:    Induction: Intravenous  Airway Management Planned: Oral ETT  Additional Equipment:   Intra-op Plan:   Post-operative Plan: Extubation in OR  Informed Consent: I have reviewed the patients History and Physical, chart, labs and discussed the procedure including the risks, benefits and alternatives for the proposed anesthesia with the patient or authorized representative who has indicated his/her understanding and acceptance.   Dental advisory given  Plan Discussed with: CRNA and Surgeon  Anesthesia Plan Comments:        Anesthesia Quick Evaluation

## 2015-09-27 NOTE — Transfer of Care (Signed)
Immediate Anesthesia Transfer of Care Note  Patient: [name] [name]  Procedure(s) Performed: Procedure(s): BASILIC VEIN TRANSPOSITION right (Right)  Patient Location: PACU  Anesthesia Type:General  Level of Consciousness: awake, alert  and oriented  Airway & Oxygen Therapy: Patient Spontanous Breathing and Patient connected to nasal cannula oxygen  Post-op Assessment: Report given to RN and Post -op Vital signs reviewed and stable  Post vital signs: Reviewed and stable  Last Vitals:  Filed Vitals:   09/27/15 0637 09/27/15 1235  BP: 148/74   Pulse: 74   Temp: 36.6 C 36.7 C  Resp: 16     Complications: No apparent anesthesia complications

## 2015-09-27 NOTE — Progress Notes (Signed)
Pt is on insulin U 500 10 units with breakfast, 25 units with lunch, 20 units with dinner and Insulin Toujeo 90 units BID. Dr. Kellie Simmering order to resume the home insulin. Per hospital policy, patient need to bring their concentrated insulin from home if want to continue home regimen. I spoke with patient, he lives by himself, and cannot have anybody to bring the insulin pens here. He agrees to use our regular lantus and novolog. I spoke with Dr. Scot Dock, he is ok with this switch. Patient's current CBGs are 120 - 130. Just had surgery, and starting po diet tonight. Dr. Scot Dock agreed to reduce lantus to 70 units BID for now. PA already ordered sliding scale novolog.  Thanks.   Maryanna Shape, PharmD, [place], [phone]

## 2015-09-28 DIAGNOSIS — J449 Chronic obstructive pulmonary disease, unspecified: Secondary | ICD-10-CM | POA: Diagnosis not present

## 2015-09-28 DIAGNOSIS — E785 Hyperlipidemia, unspecified: Secondary | ICD-10-CM | POA: Diagnosis not present

## 2015-09-28 DIAGNOSIS — F4310 Post-traumatic stress disorder, unspecified: Secondary | ICD-10-CM | POA: Diagnosis not present

## 2015-09-28 DIAGNOSIS — I509 Heart failure, unspecified: Secondary | ICD-10-CM | POA: Diagnosis not present

## 2015-09-28 DIAGNOSIS — I130 Hypertensive heart and chronic kidney disease with heart failure and stage 1 through stage 4 chronic kidney disease, or unspecified chronic kidney disease: Secondary | ICD-10-CM | POA: Diagnosis not present

## 2015-09-28 DIAGNOSIS — N184 Chronic kidney disease, stage 4 (severe): Secondary | ICD-10-CM | POA: Diagnosis not present

## 2015-09-28 LAB — GLUCOSE, CAPILLARY
Glucose-Capillary: 128 mg/dL — ABNORMAL HIGH (ref 65–99)
Glucose-Capillary: 149 mg/dL — ABNORMAL HIGH (ref 65–99)

## 2015-09-28 MED ORDER — HYDROCODONE-ACETAMINOPHEN 5-325 MG PO TABS
2.0000 | ORAL_TABLET | Freq: Four times a day (QID) | ORAL | Status: DC | PRN
  Administered 2015-09-28: 2 via ORAL
  Filled 2015-09-28: qty 2

## 2015-09-28 NOTE — Progress Notes (Signed)
[facility]   Subjective  - He has pain in the right arm secondary to surgery.  Vomiting 2 times over night.  Zofran given.   Objective 143/48 94 97.8 F (36.6 C) (Oral) 17 96%  Intake/Output Summary (Last 24 hours) at 09/28/15 0720 Last data filed at 09/28/15 0500  Gross per 24 hour  Intake   1280 ml  Output    252 ml  Net   1028 ml    Right radial pulses weak to palpation, doppler biphasic Incision clean and dry, min edema Productive cough, O2 SAT 96% RA    Assessment/Planning: POD # BASILIC VEIN TRANSPOSITION right  Discharge home F/u with Dr. Jolyn Nap, [place] 09/28/2015 7:20 AM --  Laboratory Lab Results:  Recent Labs  09/27/15 0702  HGB 10.5*  HCT 31.0*   BMET  Recent Labs  09/27/15 0702  NA 144  K 4.3  GLUCOSE 110*    COAG Lab Results  Component Value Date   INR 1.1 12/11/2007   No results found for: PTT

## 2015-09-28 NOTE — Progress Notes (Signed)
Pt discharged home via [facility] transport.  Alert and oriented x4.  No c/o pain.  IV D/cd.  Tele D/cd.  Education given on diet, activity, meds, and follow-up wound care and appointments. Pt verbalized understanding.  Pt given prescription.

## 2015-09-30 ENCOUNTER — Encounter (HOSPITAL_COMMUNITY): Payer: Self-pay | Admitting: Vascular Surgery

## 2015-09-30 ENCOUNTER — Telehealth: Payer: Self-pay | Admitting: Vascular Surgery

## 2015-09-30 NOTE — Telephone Encounter (Addendum)
-----   Message from Mena Goes, RN sent at 09/27/2015 12:27 PM EST ----- Regarding: schedule   ----- Message -----    From: Gabriel Earing, PA-C    Sent: 09/27/2015  12:12 PM      To: Vvs Charge Pool  S/p right BVT 09/27/15.  F/u with Dr. Kellie Simmering in 6 weeks.  He does not need a duplex.  Thanks,  Aldona Bar  Notified patient of post op appt.on 11-19-15 at 11:30 with Dr. Kellie Simmering

## 2015-09-30 NOTE — Telephone Encounter (Deleted)
-----   Message from Mena Goes, RN sent at 09/27/2015 12:27 PM EST ----- Regarding: schedule   ----- Message -----    From: Gabriel Earing, PA-C    Sent: 09/27/2015  12:12 PM      To: Vvs Charge Pool  S/p right BVT 09/27/15.  F/u with Dr. Kellie Simmering in 6 weeks.  He does not need a duplex.  Thanks,  Aldona Bar

## 2015-09-30 NOTE — Discharge Summary (Signed)
[facility] Discharge Summary   Patient ID:  [name] [name] MRN: [id number] DOB/AGE: 1966-04-04 49 y.o.  Admit date: 09/27/2015 Discharge date:09/28/2015 Date of Surgery: 09/27/2015 Surgeon: Surgeon(s): Mal Misty, MD  Admission Diagnosis: Stage IV Chronic Kidney Disease N18.4  Discharge Diagnoses:  Stage IV Chronic Kidney Disease N18.4  Secondary Diagnoses: Past Medical History  Diagnosis Date  . Hypertension   . Leg pain 02/05/11    with walking  . Weight gain   . Shortness of breath     when lying flat  . Diminished eyesight   . Hearing difficulty   . Anemia   . Nervousness(799.21)   . COPD (chronic obstructive pulmonary disease) ([place])   . CHF (congestive heart failure) ([place])   . Posttraumatic stress disorder   . Allergic rhinitis   . Dyslipidemia   . Morbid obesity ([place])   . Gait disorder   . Bilateral carpal tunnel syndrome   . Gastroparesis   . Gout   . Diabetic retinopathy ([place])   . Renal transplant, status post 04/05/2014  . Incomplete bladder emptying   . Urethral stricture   . Aortic insufficiency     a. Echo 7/16:  Mod LVH, EF 50-55%, mild to mod AI, severe LAE, PASP 57 mmHg (LV ID end diastolic [id number] mm)  . Hyperlipidemia   . Osteomyelitis ([place])   . Endocarditis   . Vitamin D deficiency   . Secondary hyperparathyroidism ([place])   . Rotator cuff disorder   . Discitis of lumbar region     resolved  . Obstructive sleep apnea     not using CPAP- lost 100lbs  . Claustrophobia   . Diabetes mellitus     Type II  . Diabetic peripheral neuropathy ([place])   . GERD (gastroesophageal reflux disease)   . Sepsis ([place])     Postive blood cultures -   . Endocarditis 11/28/14  . Complication of anesthesia     "they said something at [facility] it was affected by sleep aqpnea."  . Chronic kidney disease     Kidney Transplant 2009 09/26/15- transplanated kidney failing  . Chronic renal allograft nephropathy   . Chronic kidney disease  (CKD), stage IV (severe) (H[facility])     Procedure(s): BASILIC VEIN TRANSPOSITION right  Discharged Condition: good  HPI: this 49 year old male was referred by Dr. Jamal Maes for vascular access. Patient started on hemodialysis in 2005 through a left brachial-cephalic AV fistula. He had kidney transplant in 2009 which is now failing and he will likely need hemodialysis in the very near future. He also has a history of a failed right brachial-cephalic AV fistula. He has no history of coronary artery disease, myocardial infarction, CVA.  Assessment:     Chronic kidney disease stage IV with failing renal transplant Needs right basilic vein transposition under general anesthesia    Plan:     Plan right basilic vein transposition this Friday, December 16 at [facility] as an outpatient This and benefits thoroughly discussed with patient and he would like to proceed       Hospital Course:  [name] [name] is a 49 y.o. male is S/P  Procedure(s): BASILIC VEIN TRANSPOSITION right  He had post operative complications of O2 saturation dropping.  He was admitted over night for observation.   POD# 1  Right radial pulses weak to palpation, doppler biphasic Incision clean and dry, min edema Productive cough, O2 SAT 96% RA Disposition stable he was discharged home  Significant Diagnostic Studies:  CBC Lab Results  Component Value Date   WBC 7.0 09/13/2015   HGB 10.5* 09/27/2015   HCT 31.0* 09/27/2015   MCV 86.6 09/13/2015   PLT 236 09/13/2015    BMET    Component Value Date/Time   NA 144 09/27/2015 0702   K 4.3 09/27/2015 0702   CL 103 09/13/2015 2224   CO2 26 08/21/2015 1052   GLUCOSE 110* 09/27/2015 0702   BUN 56* 09/13/2015 2224   CREATININE 3.60* 09/13/2015 2224   CALCIUM 7.1* 08/21/2015 1052   GFRNONAA 35* 11/22/2014 1208   GFRAA 41* 11/22/2014 1208   COAG Lab Results  Component Value Date   INR 1.1 12/11/2007     Disposition:  Discharge to :Home Discharge  Instructions    Call MD for:  redness, tenderness, or signs of infection (pain, swelling, bleeding, redness, odor or green/yellow discharge around incision site)    Complete by:  As directed      Call MD for:  severe or increased pain, loss or decreased feeling  in affected limb(s)    Complete by:  As directed      Call MD for:  temperature >100.5    Complete by:  As directed      Discharge patient    Complete by:  As directed   Discharge pt to home     Driving Restrictions    Complete by:  As directed   No driving for 24 hours and while taking pain medication.     Lifting restrictions    Complete by:  As directed   No lifting for 3 weeks     Resume previous diet    Complete by:  As directed      may wash over wound with mild soap and water    Complete by:  As directed             Medication List    TAKE these medications        ACCU-CHEK FASTCLIX LANCETS Misc  Use to check blood sugar 4 times per day dx code E11.9     aspirin 325 MG tablet  Take 81 mg by mouth daily.     calcitRIOL 0.5 MCG capsule  Commonly known as:  ROCALTROL  Take 1.5 mcg by mouth daily. Takes 3 tablets daily.     clonazePAM 0.5 MG tablet  Commonly known as:  KLONOPIN  Take 1 mg by mouth at bedtime.     cloNIDine 0.3 MG tablet  Commonly known as:  CATAPRES  Take 0.3 mg by mouth 3 (three) times daily.     diazepam 5 MG tablet  Commonly known as:  VALIUM  Take 1 tablet (5 mg total) by mouth 2 (two) times daily.     fluconazole 50 MG tablet  Commonly known as:  DIFLUCAN  Take 50 mg by mouth every Monday, Wednesday, and Friday.     furosemide 80 MG tablet  Commonly known as:  LASIX  Take 2 tablets by mouth 2 (two) times daily.     glucose blood test strip  Commonly known as:  ACCU-CHEK SMARTVIEW  Use as instructed to check blood sugar 4 times per day dx code E11.9     HYDROcodone-acetaminophen 5-325 MG tablet  Commonly known as:  NORCO/VICODIN  Take 1 tablet by mouth every 6 (six) hours  as needed for severe pain.     Insulin Glargine 300 UNIT/ML Sopn  Commonly known as:  TOUJEO SOLOSTAR  Inject 90 Units into  the skin 2 (two) times daily.     Insulin Pen Needle 32G X 4 MM Misc  Commonly known as:  BD PEN NEEDLE NANO U/F  Use 5 per day     insulin regular human CONCENTRATED 500 UNIT/ML kwikpen  Commonly known as:  HUMULIN R U-500 KWIKPEN  Inject 10 Units into the skin once. U-500 insulin 10 before bfst, 25 before lunch and 20 before dinner, take 30 min before Sliding scale use for high sugars:  Sugar 150-199 take 2 units, 200-250= 4 units and over 250 take 6 units     mycophenolate 250 MG capsule  Commonly known as:  CELLCEPT  Take 750 mg by mouth 2 (two) times daily.     omeprazole 20 MG capsule  Commonly known as:  PRILOSEC  Take 20 mg by mouth daily.     ondansetron 8 MG tablet  Commonly known as:  ZOFRAN  Take 1 tablet (8 mg total) by mouth every 8 (eight) hours as needed for nausea or vomiting.     predniSONE 5 MG tablet  Commonly known as:  DELTASONE  Take 5 mg by mouth daily.     pregabalin 100 MG capsule  Commonly known as:  LYRICA  Take 100 mg by mouth every evening.     rosuvastatin 20 MG tablet  Commonly known as:  CRESTOR  Take 1 tablet (20 mg total) by mouth at bedtime.     sulfamethoxazole-trimethoprim 400-80 MG tablet  Commonly known as:  BACTRIM,SEPTRA  Take 1 tablet by mouth 3 (three) times a week. Takes 1 tablet M/W/F.     tacrolimus 0.5 MG capsule  Commonly known as:  PROGRAF  Take 2.5 mg by mouth 2 (two) times daily. Takes 5 capsules 2 times a day     tadalafil 5 MG tablet  Commonly known as:  CIALIS  Take 5 mg by mouth daily as needed for erectile dysfunction.     ULORIC 80 MG Tabs  Generic drug:  Febuxostat  Take 80 mg by mouth daily.     valGANciclovir 450 MG tablet  Commonly known as:  VALCYTE  Take 450 mg by mouth. Takes 1 tablet  M/W/F.       Verbal and written Discharge instructions given to the patient. Wound care  per Discharge AVS     Follow-up Information    Follow up with [name], MD In 6 weeks.   Specialties:  Vascular Surgery, Interventional Cardiology, Cardiology   Why:  Office will call you to arrange your appt (sent)   Contact information:   [address] [place] [address] [phone]       Signed: [facility] 09/30/2015, 12:30 PM

## 2015-10-01 ENCOUNTER — Telehealth: Payer: Self-pay

## 2015-10-01 NOTE — Telephone Encounter (Signed)
Phone call from pt.  Reported some redness at the incisional area right arm.  Stated the incision is intact.  Denied any drainage.  Denied fever/ chills.  Reported some swelling in the incision site, to just below the elbow.  Reported some numbness in the fingers, when he elevates his arm.  Stated he is taking pain medication, 2 tabs at night, and 1 tab every 6 hrs.    Advised that due to the inflammatory phase in healing, there is some redness at the incision, in the first 1-4 days, post-op.  Advised that the redness should start to resolve.  Also advised to reposition (R) hand downward, when he experiences numbness in his fingers; and to do active ROM with right hand fingers, at intervals.  Encouraged to continue to monitor right arm incision, and to call office, if symptoms worsen.

## 2015-10-07 ENCOUNTER — Inpatient Hospital Stay (HOSPITAL_COMMUNITY)
Admission: EM | Admit: 2015-10-07 | Discharge: 2015-10-26 | DRG: 299 | Disposition: A | Discharge status: Home Health | Payer: Medicare Other | Attending: Internal Medicine | Admitting: Internal Medicine

## 2015-10-07 ENCOUNTER — Encounter (HOSPITAL_COMMUNITY): Payer: Self-pay | Admitting: *Deleted

## 2015-10-07 ENCOUNTER — Emergency Department (HOSPITAL_COMMUNITY): Payer: Medicare Other

## 2015-10-07 DIAGNOSIS — F40240 Claustrophobia: Secondary | ICD-10-CM | POA: Diagnosis present

## 2015-10-07 DIAGNOSIS — E875 Hyperkalemia: Secondary | ICD-10-CM | POA: Diagnosis present

## 2015-10-07 DIAGNOSIS — N359 Urethral stricture, unspecified: Secondary | ICD-10-CM | POA: Diagnosis present

## 2015-10-07 DIAGNOSIS — Z833 Family history of diabetes mellitus: Secondary | ICD-10-CM | POA: Diagnosis not present

## 2015-10-07 DIAGNOSIS — J9690 Respiratory failure, unspecified, unspecified whether with hypoxia or hypercapnia: Secondary | ICD-10-CM | POA: Diagnosis not present

## 2015-10-07 DIAGNOSIS — N179 Acute kidney failure, unspecified: Secondary | ICD-10-CM | POA: Diagnosis not present

## 2015-10-07 DIAGNOSIS — M7989 Other specified soft tissue disorders: Secondary | ICD-10-CM | POA: Diagnosis not present

## 2015-10-07 DIAGNOSIS — Z79899 Other long term (current) drug therapy: Secondary | ICD-10-CM | POA: Diagnosis not present

## 2015-10-07 DIAGNOSIS — K3184 Gastroparesis: Secondary | ICD-10-CM | POA: Diagnosis present

## 2015-10-07 DIAGNOSIS — I351 Nonrheumatic aortic (valve) insufficiency: Secondary | ICD-10-CM | POA: Diagnosis present

## 2015-10-07 DIAGNOSIS — Y830 Surgical operation with transplant of whole organ as the cause of abnormal reaction of the patient, or of later complication, without mention of misadventure at the time of the procedure: Secondary | ICD-10-CM | POA: Diagnosis present

## 2015-10-07 DIAGNOSIS — E1165 Type 2 diabetes mellitus with hyperglycemia: Secondary | ICD-10-CM | POA: Diagnosis not present

## 2015-10-07 DIAGNOSIS — J811 Chronic pulmonary edema: Secondary | ICD-10-CM | POA: Diagnosis not present

## 2015-10-07 DIAGNOSIS — N184 Chronic kidney disease, stage 4 (severe): Secondary | ICD-10-CM | POA: Diagnosis present

## 2015-10-07 DIAGNOSIS — E1121 Type 2 diabetes mellitus with diabetic nephropathy: Secondary | ICD-10-CM | POA: Diagnosis not present

## 2015-10-07 DIAGNOSIS — J449 Chronic obstructive pulmonary disease, unspecified: Secondary | ICD-10-CM | POA: Diagnosis present

## 2015-10-07 DIAGNOSIS — G4733 Obstructive sleep apnea (adult) (pediatric): Secondary | ICD-10-CM | POA: Diagnosis not present

## 2015-10-07 DIAGNOSIS — R339 Retention of urine, unspecified: Secondary | ICD-10-CM | POA: Diagnosis not present

## 2015-10-07 DIAGNOSIS — J810 Acute pulmonary edema: Secondary | ICD-10-CM | POA: Diagnosis not present

## 2015-10-07 DIAGNOSIS — Z6838 Body mass index (BMI) 38.0-38.9, adult: Secondary | ICD-10-CM

## 2015-10-07 DIAGNOSIS — H9190 Unspecified hearing loss, unspecified ear: Secondary | ICD-10-CM | POA: Diagnosis present

## 2015-10-07 DIAGNOSIS — E1143 Type 2 diabetes mellitus with diabetic autonomic (poly)neuropathy: Secondary | ICD-10-CM | POA: Diagnosis present

## 2015-10-07 DIAGNOSIS — E119 Type 2 diabetes mellitus without complications: Secondary | ICD-10-CM | POA: Diagnosis not present

## 2015-10-07 DIAGNOSIS — I5033 Acute on chronic diastolic (congestive) heart failure: Secondary | ICD-10-CM | POA: Diagnosis present

## 2015-10-07 DIAGNOSIS — N2581 Secondary hyperparathyroidism of renal origin: Secondary | ICD-10-CM | POA: Diagnosis present

## 2015-10-07 DIAGNOSIS — Z8249 Family history of ischemic heart disease and other diseases of the circulatory system: Secondary | ICD-10-CM | POA: Diagnosis not present

## 2015-10-07 DIAGNOSIS — T83098A Other mechanical complication of other indwelling urethral catheter, initial encounter: Secondary | ICD-10-CM | POA: Diagnosis not present

## 2015-10-07 DIAGNOSIS — I5023 Acute on chronic systolic (congestive) heart failure: Secondary | ICD-10-CM | POA: Diagnosis not present

## 2015-10-07 DIAGNOSIS — R310 Gross hematuria: Secondary | ICD-10-CM | POA: Diagnosis not present

## 2015-10-07 DIAGNOSIS — I248 Other forms of acute ischemic heart disease: Secondary | ICD-10-CM | POA: Diagnosis present

## 2015-10-07 DIAGNOSIS — E785 Hyperlipidemia, unspecified: Secondary | ICD-10-CM | POA: Diagnosis present

## 2015-10-07 DIAGNOSIS — I10 Essential (primary) hypertension: Secondary | ICD-10-CM | POA: Diagnosis not present

## 2015-10-07 DIAGNOSIS — I1311 Hypertensive heart and chronic kidney disease without heart failure, with stage 5 chronic kidney disease, or end stage renal disease: Secondary | ICD-10-CM | POA: Diagnosis not present

## 2015-10-07 DIAGNOSIS — E11319 Type 2 diabetes mellitus with unspecified diabetic retinopathy without macular edema: Secondary | ICD-10-CM | POA: Diagnosis present

## 2015-10-07 DIAGNOSIS — E872 Acidosis: Secondary | ICD-10-CM | POA: Diagnosis not present

## 2015-10-07 DIAGNOSIS — G9340 Encephalopathy, unspecified: Secondary | ICD-10-CM | POA: Diagnosis not present

## 2015-10-07 DIAGNOSIS — Z888 Allergy status to other drugs, medicaments and biological substances status: Secondary | ICD-10-CM

## 2015-10-07 DIAGNOSIS — R1110 Vomiting, unspecified: Secondary | ICD-10-CM | POA: Diagnosis not present

## 2015-10-07 DIAGNOSIS — D631 Anemia in chronic kidney disease: Secondary | ICD-10-CM | POA: Diagnosis present

## 2015-10-07 DIAGNOSIS — I132 Hypertensive heart and chronic kidney disease with heart failure and with stage 5 chronic kidney disease, or end stage renal disease: Secondary | ICD-10-CM | POA: Diagnosis present

## 2015-10-07 DIAGNOSIS — E1142 Type 2 diabetes mellitus with diabetic polyneuropathy: Secondary | ICD-10-CM | POA: Diagnosis present

## 2015-10-07 DIAGNOSIS — Z992 Dependence on renal dialysis: Secondary | ICD-10-CM

## 2015-10-07 DIAGNOSIS — Z940 Kidney transplant status: Secondary | ICD-10-CM | POA: Diagnosis not present

## 2015-10-07 DIAGNOSIS — I82402 Acute embolism and thrombosis of unspecified deep veins of left lower extremity: Secondary | ICD-10-CM | POA: Diagnosis not present

## 2015-10-07 DIAGNOSIS — R0600 Dyspnea, unspecified: Secondary | ICD-10-CM

## 2015-10-07 DIAGNOSIS — T8619 Other complication of kidney transplant: Secondary | ICD-10-CM | POA: Diagnosis present

## 2015-10-07 DIAGNOSIS — Z7952 Long term (current) use of systemic steroids: Secondary | ICD-10-CM | POA: Diagnosis not present

## 2015-10-07 DIAGNOSIS — Z4901 Encounter for fitting and adjustment of extracorporeal dialysis catheter: Secondary | ICD-10-CM | POA: Diagnosis not present

## 2015-10-07 DIAGNOSIS — K219 Gastro-esophageal reflux disease without esophagitis: Secondary | ICD-10-CM | POA: Diagnosis present

## 2015-10-07 DIAGNOSIS — D72819 Decreased white blood cell count, unspecified: Secondary | ICD-10-CM | POA: Diagnosis not present

## 2015-10-07 DIAGNOSIS — F419 Anxiety disorder, unspecified: Secondary | ICD-10-CM | POA: Diagnosis present

## 2015-10-07 DIAGNOSIS — Z794 Long term (current) use of insulin: Secondary | ICD-10-CM | POA: Diagnosis not present

## 2015-10-07 DIAGNOSIS — F4310 Post-traumatic stress disorder, unspecified: Secondary | ICD-10-CM | POA: Diagnosis present

## 2015-10-07 DIAGNOSIS — I509 Heart failure, unspecified: Secondary | ICD-10-CM

## 2015-10-07 DIAGNOSIS — R4182 Altered mental status, unspecified: Secondary | ICD-10-CM | POA: Diagnosis not present

## 2015-10-07 DIAGNOSIS — Z4659 Encounter for fitting and adjustment of other gastrointestinal appliance and device: Secondary | ICD-10-CM

## 2015-10-07 DIAGNOSIS — M109 Gout, unspecified: Secondary | ICD-10-CM | POA: Diagnosis present

## 2015-10-07 DIAGNOSIS — R9431 Abnormal electrocardiogram [ECG] [EKG]: Secondary | ICD-10-CM | POA: Diagnosis present

## 2015-10-07 DIAGNOSIS — I82409 Acute embolism and thrombosis of unspecified deep veins of unspecified lower extremity: Secondary | ICD-10-CM

## 2015-10-07 DIAGNOSIS — Z91018 Allergy to other foods: Secondary | ICD-10-CM | POA: Diagnosis not present

## 2015-10-07 DIAGNOSIS — E1122 Type 2 diabetes mellitus with diabetic chronic kidney disease: Secondary | ICD-10-CM | POA: Diagnosis present

## 2015-10-07 DIAGNOSIS — I824Z2 Acute embolism and thrombosis of unspecified deep veins of left distal lower extremity: Secondary | ICD-10-CM | POA: Diagnosis not present

## 2015-10-07 DIAGNOSIS — N185 Chronic kidney disease, stage 5: Secondary | ICD-10-CM | POA: Diagnosis not present

## 2015-10-07 DIAGNOSIS — T502X5A Adverse effect of carbonic-anhydrase inhibitors, benzothiadiazides and other diuretics, initial encounter: Secondary | ICD-10-CM | POA: Diagnosis not present

## 2015-10-07 DIAGNOSIS — N186 End stage renal disease: Secondary | ICD-10-CM

## 2015-10-07 DIAGNOSIS — N189 Chronic kidney disease, unspecified: Secondary | ICD-10-CM | POA: Diagnosis not present

## 2015-10-07 DIAGNOSIS — I82502 Chronic embolism and thrombosis of unspecified deep veins of left lower extremity: Secondary | ICD-10-CM | POA: Diagnosis present

## 2015-10-07 DIAGNOSIS — E876 Hypokalemia: Secondary | ICD-10-CM | POA: Diagnosis not present

## 2015-10-07 DIAGNOSIS — J9601 Acute respiratory failure with hypoxia: Secondary | ICD-10-CM | POA: Diagnosis not present

## 2015-10-07 DIAGNOSIS — R0602 Shortness of breath: Secondary | ICD-10-CM | POA: Diagnosis not present

## 2015-10-07 DIAGNOSIS — J9602 Acute respiratory failure with hypercapnia: Secondary | ICD-10-CM | POA: Diagnosis not present

## 2015-10-07 DIAGNOSIS — Z7982 Long term (current) use of aspirin: Secondary | ICD-10-CM | POA: Diagnosis not present

## 2015-10-07 DIAGNOSIS — R269 Unspecified abnormalities of gait and mobility: Secondary | ICD-10-CM | POA: Diagnosis not present

## 2015-10-07 DIAGNOSIS — I82432 Acute embolism and thrombosis of left popliteal vein: Principal | ICD-10-CM | POA: Diagnosis present

## 2015-10-07 DIAGNOSIS — I5020 Unspecified systolic (congestive) heart failure: Secondary | ICD-10-CM

## 2015-10-07 DIAGNOSIS — R509 Fever, unspecified: Secondary | ICD-10-CM | POA: Diagnosis not present

## 2015-10-07 HISTORY — DX: Acute embolism and thrombosis of unspecified deep veins of unspecified lower extremity: I82.409

## 2015-10-07 LAB — GLUCOSE, CAPILLARY
Glucose-Capillary: 159 mg/dL — ABNORMAL HIGH (ref 65–99)
Glucose-Capillary: 215 mg/dL — ABNORMAL HIGH (ref 65–99)

## 2015-10-07 LAB — BASIC METABOLIC PANEL
Anion gap: 12 (ref 5–15)
BUN: 62 mg/dL — ABNORMAL HIGH (ref 6–20)
CO2: 21 mmol/L — ABNORMAL LOW (ref 22–32)
Calcium: 8.3 mg/dL — ABNORMAL LOW (ref 8.9–10.3)
Chloride: 112 mmol/L — ABNORMAL HIGH (ref 101–111)
Creatinine, Ser: 3.52 mg/dL — ABNORMAL HIGH (ref 0.61–1.24)
GFR calc Af Amer: 22 mL/min — ABNORMAL LOW (ref >60)
GFR calc non Af Amer: 19 mL/min — ABNORMAL LOW (ref >60)
Glucose, Bld: 75 mg/dL (ref 65–99)
Potassium: 5.3 mmol/L — ABNORMAL HIGH (ref 3.5–5.1)
Sodium: 145 mmol/L (ref 135–145)

## 2015-10-07 LAB — APTT
aPTT: 33 seconds (ref 24–37)
aPTT: 54 seconds — ABNORMAL HIGH (ref 24–37)
aPTT: 45 seconds — ABNORMAL HIGH (ref 24–37)

## 2015-10-07 LAB — CBC
HCT: 35.1 % — ABNORMAL LOW (ref 39.0–52.0)
Hemoglobin: 10.4 g/dL — ABNORMAL LOW (ref 13.0–17.0)
MCH: 27 pg (ref 26.0–34.0)
MCHC: 29.6 g/dL — ABNORMAL LOW (ref 30.0–36.0)
MCV: 91.2 fL (ref 78.0–100.0)
Platelets: 255 10*3/uL (ref 150–400)
RBC: 3.85 MIL/uL — ABNORMAL LOW (ref 4.22–5.81)
RDW: 14.9 % (ref 11.5–15.5)
WBC: 5.2 10*3/uL (ref 4.0–10.5)

## 2015-10-07 LAB — D-DIMER, QUANTITATIVE: D-Dimer, Quant: 2.53 ug/mL-FEU — ABNORMAL HIGH (ref 0.00–0.50)

## 2015-10-07 LAB — BRAIN NATRIURETIC PEPTIDE: B Natriuretic Peptide: 1430.7 pg/mL — ABNORMAL HIGH (ref 0.0–100.0)

## 2015-10-07 LAB — TROPONIN I: Troponin I: 0.06 ng/mL — ABNORMAL HIGH (ref <0.031)

## 2015-10-07 MED ORDER — ONDANSETRON HCL 4 MG PO TABS
4.0000 mg | ORAL_TABLET | Freq: Four times a day (QID) | ORAL | Status: DC | PRN
  Administered 2015-10-23: 4 mg via ORAL
  Filled 2015-10-07: qty 1

## 2015-10-07 MED ORDER — ONDANSETRON HCL 4 MG/2ML IJ SOLN
4.0000 mg | Freq: Four times a day (QID) | INTRAMUSCULAR | Status: DC | PRN
  Administered 2015-10-07 – 2015-10-20 (×11): 4 mg via INTRAVENOUS
  Filled 2015-10-07 (×10): qty 2

## 2015-10-07 MED ORDER — FLUCONAZOLE 50 MG PO TABS
50.0000 mg | ORAL_TABLET | ORAL | Status: DC
  Administered 2015-10-09 – 2015-10-11 (×2): 50 mg via ORAL
  Filled 2015-10-07 (×2): qty 1

## 2015-10-07 MED ORDER — HYDROCODONE-ACETAMINOPHEN 5-325 MG PO TABS
1.0000 | ORAL_TABLET | Freq: Four times a day (QID) | ORAL | Status: DC | PRN
  Administered 2015-10-07 – 2015-10-08 (×2): 1 via ORAL
  Filled 2015-10-07 (×2): qty 1

## 2015-10-07 MED ORDER — ENSURE ENLIVE PO LIQD
237.0000 mL | Freq: Two times a day (BID) | ORAL | Status: DC
Start: 2015-10-08 — End: 2015-10-08
  Administered 2015-10-08 (×2): 237 mL via ORAL

## 2015-10-07 MED ORDER — DIAZEPAM 5 MG PO TABS
5.0000 mg | ORAL_TABLET | Freq: Two times a day (BID) | ORAL | Status: DC
  Administered 2015-10-07 – 2015-10-12 (×10): 5 mg via ORAL
  Filled 2015-10-07 (×10): qty 1

## 2015-10-07 MED ORDER — TACROLIMUS 1 MG PO CAPS
2.5000 mg | ORAL_CAPSULE | Freq: Two times a day (BID) | ORAL | Status: DC
  Administered 2015-10-07 – 2015-10-12 (×11): 2.5 mg via ORAL
  Filled 2015-10-07 (×13): qty 1

## 2015-10-07 MED ORDER — INSULIN ASPART 100 UNIT/ML ~~LOC~~ SOLN
0.0000 [IU] | Freq: Three times a day (TID) | SUBCUTANEOUS | Status: DC
  Administered 2015-10-08: 7 [IU] via SUBCUTANEOUS
  Administered 2015-10-08: 4 [IU] via SUBCUTANEOUS

## 2015-10-07 MED ORDER — ACETAMINOPHEN 325 MG PO TABS
650.0000 mg | ORAL_TABLET | Freq: Four times a day (QID) | ORAL | Status: DC | PRN
  Administered 2015-10-16: 650 mg via ORAL
  Filled 2015-10-07 (×3): qty 2

## 2015-10-07 MED ORDER — ARGATROBAN 50 MG/50ML IV SOLN
1.0000 ug/kg/min | INTRAVENOUS | Status: DC
  Administered 2015-10-07 (×2): 1 ug/kg/min via INTRAVENOUS
  Filled 2015-10-07 (×3): qty 50

## 2015-10-07 MED ORDER — SODIUM CHLORIDE 0.9 % IJ SOLN
3.0000 mL | Freq: Two times a day (BID) | INTRAMUSCULAR | Status: DC
  Administered 2015-10-08 – 2015-10-12 (×6): 3 mL via INTRAVENOUS
  Administered 2015-10-13: 22:00:00 via INTRAVENOUS
  Administered 2015-10-13 – 2015-10-19 (×7): 3 mL via INTRAVENOUS

## 2015-10-07 MED ORDER — SODIUM CHLORIDE 0.9 % IJ SOLN: 3.0000 mL | INTRAMUSCULAR | Status: DC | PRN

## 2015-10-07 MED ORDER — SODIUM CHLORIDE 0.9 % IV SOLN: 250.0000 mL | INTRAVENOUS | Status: DC | PRN

## 2015-10-07 MED ORDER — PREGABALIN 100 MG PO CAPS
100.0000 mg | ORAL_CAPSULE | Freq: Every day | ORAL | Status: DC
  Administered 2015-10-07 – 2015-10-12 (×5): 100 mg via ORAL
  Filled 2015-10-07 (×6): qty 1

## 2015-10-07 MED ORDER — FEBUXOSTAT 40 MG PO TABS
80.0000 mg | ORAL_TABLET | Freq: Every day | ORAL | Status: DC
  Administered 2015-10-07 – 2015-10-26 (×19): 80 mg via ORAL
  Filled 2015-10-07 (×20): qty 2

## 2015-10-07 MED ORDER — VALGANCICLOVIR HCL 450 MG PO TABS
450.0000 mg | ORAL_TABLET | ORAL | Status: DC
  Administered 2015-10-09 – 2015-10-11 (×2): 450 mg via ORAL
  Filled 2015-10-07 (×2): qty 1

## 2015-10-07 MED ORDER — SULFAMETHOXAZOLE-TRIMETHOPRIM 400-80 MG PO TABS
1.0000 | ORAL_TABLET | ORAL | Status: DC
  Administered 2015-10-09: 1 via ORAL
  Filled 2015-10-07: qty 1

## 2015-10-07 MED ORDER — ROSUVASTATIN CALCIUM 20 MG PO TABS
20.0000 mg | ORAL_TABLET | Freq: Every day | ORAL | Status: DC
  Administered 2015-10-07 – 2015-10-25 (×17): 20 mg via ORAL
  Filled 2015-10-07 (×20): qty 1

## 2015-10-07 MED ORDER — PANTOPRAZOLE SODIUM 40 MG PO TBEC
40.0000 mg | DELAYED_RELEASE_TABLET | Freq: Every day | ORAL | Status: DC
  Administered 2015-10-07 – 2015-10-12 (×6): 40 mg via ORAL
  Filled 2015-10-07 (×7): qty 1

## 2015-10-07 MED ORDER — SODIUM POLYSTYRENE SULFONATE 15 GM/60ML PO SUSP
30.0000 g | Freq: Once | ORAL | Status: AC
  Administered 2015-10-07: 30 g via ORAL
  Filled 2015-10-07: qty 120

## 2015-10-07 MED ORDER — FUROSEMIDE 80 MG PO TABS
80.0000 mg | ORAL_TABLET | Freq: Three times a day (TID) | ORAL | Status: DC
  Administered 2015-10-07 – 2015-10-08 (×3): 80 mg via ORAL
  Filled 2015-10-07 (×3): qty 1

## 2015-10-07 MED ORDER — BISACODYL 10 MG RE SUPP
10.0000 mg | Freq: Every day | RECTAL | Status: DC | PRN
  Administered 2015-10-12: 10 mg via RECTAL
  Filled 2015-10-07: qty 1

## 2015-10-07 MED ORDER — MYCOPHENOLATE MOFETIL 250 MG PO CAPS
750.0000 mg | ORAL_CAPSULE | Freq: Two times a day (BID) | ORAL | Status: DC
  Administered 2015-10-07 – 2015-10-18 (×20): 750 mg via ORAL
  Filled 2015-10-07 (×24): qty 3

## 2015-10-07 MED ORDER — CALCITRIOL 0.25 MCG PO CAPS
1.5000 ug | ORAL_CAPSULE | Freq: Every day | ORAL | Status: DC
  Administered 2015-10-07 – 2015-10-17 (×10): 1.5 ug via ORAL
  Filled 2015-10-07: qty 6
  Filled 2015-10-07 (×7): qty 3
  Filled 2015-10-07: qty 6
  Filled 2015-10-07 (×2): qty 3

## 2015-10-07 MED ORDER — CLONIDINE HCL 0.2 MG PO TABS
0.3000 mg | ORAL_TABLET | Freq: Three times a day (TID) | ORAL | Status: DC
  Administered 2015-10-07 – 2015-10-13 (×17): 0.3 mg via ORAL
  Filled 2015-10-07 (×17): qty 1

## 2015-10-07 MED ORDER — HYDRALAZINE HCL 20 MG/ML IJ SOLN
10.0000 mg | Freq: Four times a day (QID) | INTRAMUSCULAR | Status: DC | PRN
  Administered 2015-10-07 – 2015-10-15 (×6): 10 mg via INTRAVENOUS
  Filled 2015-10-07 (×6): qty 1

## 2015-10-07 MED ORDER — ACETAMINOPHEN 650 MG RE SUPP
650.0000 mg | Freq: Four times a day (QID) | RECTAL | Status: DC | PRN
  Administered 2015-10-14 – 2015-10-15 (×2): 650 mg via RECTAL
  Filled 2015-10-07 (×2): qty 1

## 2015-10-07 MED ORDER — PREDNISONE 5 MG PO TABS
5.0000 mg | ORAL_TABLET | Freq: Every day | ORAL | Status: DC
  Administered 2015-10-08 – 2015-10-26 (×16): 5 mg via ORAL
  Filled 2015-10-07 (×21): qty 1

## 2015-10-07 MED ORDER — FUROSEMIDE 10 MG/ML IJ SOLN
60.0000 mg | Freq: Once | INTRAMUSCULAR | Status: AC
  Administered 2015-10-07: 60 mg via INTRAVENOUS
  Filled 2015-10-07: qty 6

## 2015-10-07 MED ORDER — ASPIRIN EC 81 MG PO TBEC
81.0000 mg | DELAYED_RELEASE_TABLET | Freq: Every day | ORAL | Status: DC
  Administered 2015-10-07 – 2015-10-25 (×18): 81 mg via ORAL
  Filled 2015-10-07 (×19): qty 1

## 2015-10-07 MED ORDER — ARGATROBAN 50 MG/50ML IV SOLN
1.2000 ug/kg/min | INTRAVENOUS | Status: DC
  Administered 2015-10-08 – 2015-10-13 (×18): 1.2 ug/kg/min via INTRAVENOUS
  Filled 2015-10-07 (×23): qty 50

## 2015-10-07 MED ORDER — CLONAZEPAM 1 MG PO TABS: 1.0000 mg | ORAL_TABLET | Freq: Every evening | ORAL | Status: DC | PRN

## 2015-10-07 NOTE — ED Notes (Signed)
Pt admits to shortness of breath progressively worse x3 days, worse at night. Denies chest pain, hx of athma, fever or cough.  Pt had surgery on 12/16 for dialysis fistula.

## 2015-10-07 NOTE — ED Notes (Signed)
Pt back from Vascular.

## 2015-10-07 NOTE — Progress Notes (Addendum)
ANTICOAGULATION CONSULT NOTE - Follow Up Consult  Pharmacy Consult for Argatroban Indication: DVT  Allergies  Allergen Reactions  . Penicillins Rash and Other (See Comments)    Has patient had a PCN reaction causing immediate rash, facial/tongue/throat swelling, SOB or lightheadedness with hypotension: No Has patient had a PCN reaction causing severe rash involving mucus membranes or skin necrosis: No Has patient had a PCN reaction that required hospitalization No Has patient had a PCN reaction occurring within the last 10 years: No If all of the above answers are "NO", then may proceed with Cephalosporin use.  . Pork-Derived Products Anaphylaxis and Other (See Comments)    Fever also   . Ace Inhibitors Cough    Patient Measurements: Height: 6' (182.9 cm) Weight: 268 lb 3.2 oz (121.655 kg) IBW/kg (Calculated) : 77.6  Vital Signs: Temp: 97.7 F (36.5 C) (12/26 1643) Temp Source: Oral (12/26 1643) BP: 174/31 mmHg (12/26 1600) Pulse Rate: 95 (12/26 1643)  Labs:  Recent Labs  10/07/15 1145 10/07/15 1300 10/07/15 1810  HGB 10.4*  --   --   HCT 35.1*  --   --   PLT 255  --   --   APTT  --  33 54*  CREATININE 3.52*  --   --   TROPONINI 0.06*  --   --     Estimated Creatinine Clearance: 33.4 mL/min (by C-G formula based on Cr of 3.52).   Medications:  Scheduled:  . aspirin EC  81 mg Oral Daily  . calcitRIOL  1.5 mcg Oral Daily  . cloNIDine  0.3 mg Oral TID  . diazepam  5 mg Oral BID  . febuxostat  80 mg Oral Daily  . [START ON 10/09/2015] fluconazole  50 mg Oral Q [name],W,F  . furosemide  80 mg Oral TID AC  . [START ON 10/08/2015] insulin aspart  0-20 Units Subcutaneous TID WC  . mycophenolate  750 mg Oral BID  . pantoprazole  40 mg Oral Daily  . [START ON 10/08/2015] predniSONE  5 mg Oral Q breakfast  . pregabalin  100 mg Oral QHS  . rosuvastatin  20 mg Oral QHS  . sodium chloride  3 mL Intravenous Q12H  . [START ON 10/09/2015] sulfamethoxazole-trimethoprim  1  tablet Oral Once per day on Mon Wed Fri  . tacrolimus  2.5 mg Oral BID  . [START ON 10/09/2015] valGANciclovir  450 mg Oral Once per day on Mon Wed Fri   Infusions:  . argatroban 1 mcg/kg/min (10/07/15 1925)    Assessment: 49 yo [name] with acute mobile DVT and likely PE (VQ scan pending).  Pt is on Argatroban at 64mcg/kg/min with therapeutic aPTT.  No bleeding noted.  Goal of Therapy:  aPTT 50-90 seconds Monitor platelets by anticoagulation protocol: Yes   Plan:  Continue argatroban at 1 mcg/kg/min Recheck aPTT in 2 hours for confirmation. Daily aPTT and CBC Follow-up plans for oral anticaoagulation  [employer], Pharm.D., BCPS Clinical Pharmacist Pager [phone] 10/07/2015 8:30 PM    Addendum: 10/07/2015 10:15 PM  Repeat aPTT = 45, now subtherapeutic.  Will increase infusion rate by 20% to 1.2 mcg/kg/min.  Noted previous dosing based on stated weight of 117 kg.  Will adjust to be based on TBW of 121 kg.  Next lab check in 2 hours.  [employer], Pharm.D., BCPS Clinical Pharmacist Pager [phone] 10/07/2015 10:16 PM

## 2015-10-07 NOTE — ED Notes (Signed)
Heart healthy/renal lunch tray ordered @ 14:51

## 2015-10-07 NOTE — H&P (Signed)
[facility] History and Physical  Rollo Uppal [id number] DOB: Sep 16, 1966 DOA: 10/07/2015  Referring physician: Emergency Department PCP: Hoyt Koch, MD   CHIEF COMPLAINT:   LLE swelling      HPI: [name] [name] is a 49 y.o. male  with multiple medical problems not limited to renal transplant, chronic kidney disease, diabetes and obesity Patient had had a dialysis fistula placed 09/28/15. Since then he hasn't felt well with a poor appetite, intermittent nausea and worsening of his chronic LLE swelling. He also complains to being unable to lay flat in bed to sleep secondary to SOB. Bilateral lower extremity ultrasound positive for mobile acute DVT of left popliteal vein.      ED COURSE:           Potassium 5.3, glucose 75, BUN 62, creatinine 3.5 BNP 1430, troponin 0.06  CXR:    Cardiomegaly with new small effusions, bilateral interstitial edema and vascular congestion suggesting CHF        EKG:    Sinus rhythm with Premature atrial complexes Possible Left atrial enlargement Left axis deviation Right bundle branch block Anterior infarct , age undetermined T wave abnormality, consider lateral ischemia Abnormal ECG   QT interval 544  , it was 491 in June               Medications  argatroban 1 mg/mL infusion (not administered)  furosemide (LASIX) injection 60 mg (60 mg Intravenous Given 10/07/15 1441)    Review of Systems  Constitutional: Negative.   Eyes: Negative.   Respiratory: Positive for shortness of breath.   Cardiovascular: Positive for leg swelling.  Gastrointestinal: Positive for nausea.  Genitourinary: Negative.   Musculoskeletal: Negative.   Skin: Negative.   Neurological: Negative.   Endo/Heme/Allergies: Negative.    Past Medical History  Diagnosis Date  . Hypertension   . Leg pain 02/05/11    with walking  . Weight gain   . Shortness of breath     when lying flat  . Diminished eyesight   . Hearing difficulty   . Anemia   .  Nervousness(799.21)   . COPD (chronic obstructive pulmonary disease) ([place])   . CHF (congestive heart failure) ([place])   . Posttraumatic stress disorder   . Allergic rhinitis   . Dyslipidemia   . Morbid obesity ([place])   . Gait disorder   . Bilateral carpal tunnel syndrome   . Gastroparesis   . Gout   . Diabetic retinopathy ([place])   . Renal transplant, status post 04/05/2014  . Incomplete bladder emptying   . Urethral stricture   . Aortic insufficiency     a. Echo 7/16:  Mod LVH, EF 50-55%, mild to mod AI, severe LAE, PASP 57 mmHg (LV ID end diastolic Q000111Q mm)  . Hyperlipidemia   . Osteomyelitis ([place])   . Endocarditis   . Vitamin D deficiency   . Secondary hyperparathyroidism ([place])   . Rotator cuff disorder   . Discitis of lumbar region     resolved  . Obstructive sleep apnea     not using CPAP- lost 100lbs  . Claustrophobia   . Diabetes mellitus     Type II  . Diabetic peripheral neuropathy ([place])   . GERD (gastroesophageal reflux disease)   . Sepsis ([place])     Postive blood cultures -   . Endocarditis 11/28/14  . Complication of anesthesia     "they said something at [facility] it was affected by sleep aqpnea."  . Chronic kidney  disease     Kidney Transplant 2009 09/26/15- transplanated kidney failing  . Chronic renal allograft nephropathy   . Chronic kidney disease (CKD), stage IV (severe) (HCC)    Past Surgical History  Procedure Laterality Date  . Kidney transplant  2009  . Cataract extraction w/ intraocular lens  implant, bilateral Bilateral   . Av fistula placement Bilateral 2015  . Arteriovenous graft placement Bilateral "several"  . Cystoscopy w/ internal urethrotomy  09/2014  . Bascilic vein transposition Right 09/27/2015  . Bascilic vein transposition Right 09/27/2015    Procedure: BASILIC VEIN TRANSPOSITION right;  Surgeon: Mal Misty, MD;  Location: [facility];  Service: Vascular;  Laterality: Right;    SOCIAL HISTORY:  reports that he has never smoked. He  has never used smokeless tobacco. He reports that he does not drink alcohol or use illicit drugs. Lives: at home alone   Assistive devices:   Uses a scooter for ambulation.   Allergies  Allergen Reactions  . Penicillins Rash and Other (See Comments)    Family History  Problem Relation Age of Onset  . Hypertension Mother   . Diabetes Father     Prior to Admission medications   Medication Sig Start Date End Date Taking? Authorizing Provider  ACCU-CHEK FASTCLIX LANCETS MISC Use to check blood sugar 4 times per day dx code E11.9 04/09/15   Elayne Snare, MD  aspirin 325 MG tablet Take 81 mg by mouth daily.     Historical Provider, MD  calcitRIOL (ROCALTROL) 0.5 MCG capsule Take 1.5 mcg by mouth daily. Takes 3 tablets daily.    Historical Provider, MD  clonazePAM (KLONOPIN) 0.5 MG tablet Take 1 mg by mouth at bedtime.     Historical Provider, MD  cloNIDine (CATAPRES) 0.3 MG tablet Take 0.3 mg by mouth 3 (three) times daily.     Historical Provider, MD  diazepam (VALIUM) 5 MG tablet Take 1 tablet (5 mg total) by mouth 2 (two) times daily. 09/14/15   Jaime Pilcher Ward, PA-C  Febuxostat (ULORIC) 80 MG TABS Take 80 mg by mouth daily.    Historical Provider, MD  fluconazole (DIFLUCAN) 50 MG tablet Take 50 mg by mouth every Monday, Wednesday, and Friday.     Historical Provider, MD  furosemide (LASIX) 80 MG tablet Take 2 tablets by mouth 2 (two) times daily. 12/04/14   Historical Provider, MD  glucose blood (ACCU-CHEK SMARTVIEW) test strip Use as instructed to check blood sugar 4 times per day dx code E11.9 06/25/15   Elayne Snare, MD  HYDROcodone-acetaminophen (NORCO/VICODIN) 5-325 MG tablet Take 1 tablet by mouth every 6 (six) hours as needed for severe pain. 09/27/15   Samantha J Rhyne, PA-C  Insulin Glargine (TOUJEO SOLOSTAR) 300 UNIT/ML SOPN Inject 90 Units into the skin 2 (two) times daily. 08/26/15   Elayne Snare, MD  Insulin Pen Needle (BD PEN NEEDLE NANO U/F) 32G X 4 MM MISC Use 5 per day 05/16/15    Elayne Snare, MD  insulin regular human CONCENTRATED (HUMULIN R U-500 KWIKPEN) 500 UNIT/ML kwikpen Inject 10 Units into the skin once. U-500 insulin 10 before bfst, 25 before lunch and 20 before dinner, take 30 min before Sliding scale use for high sugars:  Sugar 150-199 take 2 units, 200-250= 4 units and over 250 take 6 units 08/26/15   Elayne Snare, MD  mycophenolate (CELLCEPT) 250 MG capsule Take 750 mg by mouth 2 (two) times daily.     Historical Provider, MD  omeprazole (PRILOSEC) 20 MG  capsule Take 20 mg by mouth daily.    Historical Provider, MD  ondansetron (ZOFRAN) 8 MG tablet Take 1 tablet (8 mg total) by mouth every 8 (eight) hours as needed for nausea or vomiting. 09/14/15   Jola Schmidt, MD  predniSONE (DELTASONE) 5 MG tablet Take 5 mg by mouth daily.      Historical Provider, MD  pregabalin (LYRICA) 100 MG capsule Take 100 mg by mouth every evening.     Historical Provider, MD  rosuvastatin (CRESTOR) 20 MG tablet Take 1 tablet (20 mg total) by mouth at bedtime. 04/11/15   Hoyt Koch, MD  sulfamethoxazole-trimethoprim (BACTRIM,SEPTRA) 400-80 MG tablet Take 1 tablet by mouth 3 (three) times a week. Takes 1 tablet M/W/F.    Historical Provider, MD  tacrolimus (PROGRAF) 0.5 MG capsule Take 2.5 mg by mouth 2 (two) times daily. Takes 5 capsules 2 times a day    Historical Provider, MD  tadalafil (CIALIS) 5 MG tablet Take 5 mg by mouth daily as needed for erectile dysfunction.    Historical Provider, MD  valGANciclovir (VALCYTE) 450 MG tablet Take 450 mg by mouth. Takes 1 tablet  M/W/F.    Historical Provider, MD   PHYSICAL EXAM: Filed Vitals:   10/07/15 1300 10/07/15 1315 10/07/15 1500 10/07/15 1515  BP: 181/74 187/76 214/83 225/50  Pulse: 89 90 88 99  Temp:      TempSrc:      Resp: 18 26 21 25   SpO2: 96% 100% 99% 83%    Wt Readings from Last 3 Encounters:  09/27/15 117.935 kg (260 lb)  09/24/15 117.935 kg (260 lb)  08/26/15 124.376 kg (274 lb 3.2 oz)    General:   Pleasant obese male appears calm and comfortable Eyes: PER, normal lids, irises & conjunctiva ENT: grossly normal hearing, lips & tongue Neck: no LAD, no masses Cardiovascular: RRR, no murmurs, 1+ LLE edema  Respiratory: Respirations even and unlabored. Normal respiratory effort. Bibasilar inspiratory crackles.  .   Abdomen: soft, obese, non-tender, active bowel sounds. No obvious masses.  Skin: no rash seen on limited exam. RUE with mild erythema on underside of forearm into antecubital area.  Musculoskeletal: grossly normal tone BUE/BLE Psychiatric: grossly normal mood and affect, speech fluent and appropriate Neurologic: grossly non-focal.         LABS ON ADMISSION:    Basic Metabolic Panel:  Recent Labs Lab 10/07/15 1145  NA 145  K 5.3*  CL 112*  CO2 21*  GLUCOSE 75  BUN 62*  CREATININE 3.52*  CALCIUM 8.3*   CBC:  Recent Labs Lab 10/07/15 1145  WBC 5.2  HGB 10.4*  HCT 35.1*  MCV 91.2  PLT 255    BNP (last 3 results)  Recent Labs  10/07/15 1145  BNP 1430.7*    CREATININE: 3.52 mg/dL ABNORMAL (10/07/15 1145) Estimated creatinine clearance - 32.9 mL/min  Radiological Exams on Admission: Dg Chest 2 View  10/07/2015  CLINICAL DATA:  Pt reports shortness of breath progressively worse x 3 days, worse at night. Pt had surgery on 12/16 for dialysis fistula; h/o HTN and DM; non-smoker EXAM: CHEST - 2 VIEW COMPARISON:  01/15/2014 FINDINGS: Moderate cardiomegaly. New central pulmonary vascular congestion. Bilateral interstitial edema or infiltrates, new since prior study. Small bilateral pleural effusions, new since previous exam. No pneumothorax. Visualized skeletal structures are unremarkable. IMPRESSION: 1. Cardiomegaly with new small effusions, bilateral interstitial edema and vascular congestion suggesting CHF. Electronically Signed   By: Lucrezia Europe M.D.   On: 10/07/2015  12:10   Echocardiogram July 2016 LV ejection fraction 50-55% Left atrium severely  dilated. Mild to moderate AV regurg  ASSESSMENT / PLAN    Post -op deep vein thrombosis of left lower extremity. Not heparin candidate, porcine allergy.  Otilio Carpen per pharmacy  Dyspnea without hypoxia. PE not excluded. Cannot get CTA (CKD). -will try to get VQ scan if he can lay flat -Will treat empirically for PE with Agatroban.  Benign essential HTN / hypertensive heart disease. CXR reveales cardiomegaly with new small effusions, bilateral interstitial edema and vascular congestion. Systolic blood pressure high. -Continue home antihypertensives -Add prn hydralazine -Lasix 80mg  TID. He received 60mg  IV Lasix in ED around 2:30pm -am BMET  Hyperkalemia -30 grams of Kayexelate x1 dose Am bmet  Prolonged QT interval on EKG.  -Monitor on tele -am EKG, if normal, stop tele  History of renal transplant 2009, now with chronic kidney disease stage IV. Creatinine 3.53, overall higher than baseline but stable since 12/2 when it was 3.6.  -continue home prednisone, cellcept and prograf  Hx of enterococcus bacteremia / aortic valve endocarditis with severe aortic insufficiency at [facility]) earlier this year. Surgery recommended but patient declined. Repeat Echo in July revealed only mild to moderate AI, no signs of heart failure.   Diabetes mellitus, type 2 with polyneuropathy -monitor CBGs, start SSI  Obstructive sleep apnea, doesn't use CPAP  Anxiety -Continue home Benzos  CONSULTANTS:     Code Status: Full code DVT Prophylaxis: Argatroban will cover Family Communication:  Patient alert, oriented and understands plan of care.  Disposition Plan: Discharge to home in 2-3 days   Time spent: 60 minutes Tye Savoy  NP [facility] Pager [phone]

## 2015-10-07 NOTE — Progress Notes (Signed)
ANTICOAGULATION CONSULT NOTE - Initial Consult  Pharmacy Consult for argatroban Indication: DVT  Allergies  Allergen Reactions  . Penicillins Rash and Other (See Comments)    Has patient had a PCN reaction causing immediate rash, facial/tongue/throat swelling, SOB or lightheadedness with hypotension: No Has patient had a PCN reaction causing severe rash involving mucus membranes or skin necrosis:  Has patient had a PCN reaction that required hospitalization  Has patient had a PCN reaction occurring within the last 10 years: If all of the above answers are "NO", then may proceed with Cephalosporin use.  . Pork-Derived Products Anaphylaxis and Other (See Comments)    Fever   . Ace Inhibitors Cough    Patient Measurements:   Heparin Dosing Weight:   Vital Signs: Temp: 97.7 F (36.5 C) (12/26 1043) Temp Source: Oral (12/26 1043) BP: 187/76 mmHg (12/26 1315) Pulse Rate: 90 (12/26 1315)  Labs:  Recent Labs  10/07/15 1145  HGB 10.4*  HCT 35.1*  PLT 255  CREATININE 3.52*  TROPONINI 0.06*    Estimated Creatinine Clearance: 32.9 mL/min (by C-G formula based on Cr of 3.52).   Medical History: Past Medical History  Diagnosis Date  . Hypertension   . Leg pain 02/05/11    with walking  . Weight gain   . Shortness of breath     when lying flat  . Diminished eyesight   . Hearing difficulty   . Anemia   . Nervousness(799.21)   . COPD (chronic obstructive pulmonary disease) ([place])   . CHF (congestive heart failure) ([place])   . Posttraumatic stress disorder   . Allergic rhinitis   . Dyslipidemia   . Morbid obesity ([place])   . Gait disorder   . Bilateral carpal tunnel syndrome   . Gastroparesis   . Gout   . Diabetic retinopathy ([place])   . Renal transplant, status post 04/05/2014  . Incomplete bladder emptying   . Urethral stricture   . Aortic insufficiency     a. Echo 7/16:  Mod LVH, EF 50-55%, mild to mod AI, severe LAE, PASP 57 mmHg (LV ID end diastolic Q000111Q mm)  .  Hyperlipidemia   . Osteomyelitis ([employer])   . Endocarditis   . Vitamin D deficiency   . Secondary hyperparathyroidism ([employer])   . Rotator cuff disorder   . Discitis of lumbar region     resolved  . Obstructive sleep apnea     not using CPAP- lost 100lbs  . Claustrophobia   . Diabetes mellitus     Type II  . Diabetic peripheral neuropathy ([place])   . GERD (gastroesophageal reflux disease)   . Sepsis ([place])     Postive blood cultures -   . Endocarditis 11/28/14  . Complication of anesthesia     "they said something at [facility] it was affected by sleep aqpnea."  . Chronic kidney disease     Kidney Transplant 2009 09/26/15- transplanated kidney failing  . Chronic renal allograft nephropathy   . Chronic kidney disease (CKD), stage IV (severe) (HCC)     Medications:   (Not in a hospital admission)   Assessment: 49 yo male with acute mobile DVT also complains of SOB, PE has not been confirmed or denied at this time. Pt had a renal transplant in 2009, that kidney is now failing, but the patient has not yet begun dialysis SCr up to 3.5, CrCl ~ 30 ml/min. I discussed the porcine allergy with the patient. He is unable to tell me in detail  what happened but states many years ago he ate a pork product, which resulted in a fever and long hospitalization. Due to this, we will treat with argatroban. When he is ready for an oral agent, he will likely have to be transitioned to warfarin due to his renal dysfunction.  His current cbc is stable, hgb appears to be at baseline.   Goal of Therapy:  aPTT 50-90 seconds Monitor platelets by anticoagulation protocol: Yes   Plan:  -Argatroban 1 mcg/kg/min -aptt q2hr until 2 therapeutic levels are obtained -F/u transition to PO anticoagulation -Monitor s/sx bleeding -Daily CBC   Armine Rizzolo, Jake Church 10/07/2015,2:57 PM

## 2015-10-07 NOTE — ED Notes (Signed)
Pt transporting to vascular lab for study. NAD.

## 2015-10-07 NOTE — ED Notes (Signed)
Pt transported to xray 

## 2015-10-07 NOTE — ED Notes (Signed)
Per MD Tomi Bamberger, pt is allowed to take his own daily medications with enough water to do so. This RN walked into the room to find patient with a cup of coffee and stating "I am the boss and will drink what I would like."

## 2015-10-07 NOTE — Progress Notes (Signed)
*  Preliminary Results* Bilateral lower extremity venous duplex completed. The left lower extremity is positive for mobile acute deep vein thrombosis involving the left popliteal vein. There is no evidence of right lower extremity deep vein thrombosis or bilateral Baker's cyst.  Preliminary results discussed with Dr. Tomi Bamberger.  10/07/2015  Maudry Mayhew, RVT, RDCS, RDMS

## 2015-10-07 NOTE — ED Provider Notes (Signed)
CSN: [id number]     Arrival date & time 10/07/15  1021 History   First MD Initiated Contact with Patient 10/07/15 1051     Chief Complaint  Patient presents with  . Shortness of Breath    HPI Pt has history of chronic kidney disease s/p transplant.  Over the past week he has noticed that he was mildly short of breath.  He would notice it at night but not necessarily after lying flat.  He did have a surgical procedure to have an AV fistula placed.  He at fist thought the symptoms were from the anesthesia.  It has not gotten much better but last night it was worse.  He did not want to eat or drink much.  He feels like his legs are a little swollen, mostly the left.  Slight  cough.  No pain in the chest.  Walking around does not bother him much. Past Medical History  Diagnosis Date  . Hypertension   . Leg pain 02/05/11    with walking  . Weight gain   . Shortness of breath     when lying flat  . Diminished eyesight   . Hearing difficulty   . Anemia   . Nervousness(799.21)   . COPD (chronic obstructive pulmonary disease) ([place])   . CHF (congestive heart failure) ([place])   . Posttraumatic stress disorder   . Allergic rhinitis   . Dyslipidemia   . Morbid obesity ([place])   . Gait disorder   . Bilateral carpal tunnel syndrome   . Gastroparesis   . Gout   . Diabetic retinopathy ([place])   . Renal transplant, status post 04/05/2014  . Incomplete bladder emptying   . Urethral stricture   . Aortic insufficiency     a. Echo 7/16:  Mod LVH, EF 50-55%, mild to mod AI, severe LAE, PASP 57 mmHg (LV ID end diastolic [id number] mm)  . Hyperlipidemia   . Osteomyelitis ([place])   . Endocarditis   . Vitamin D deficiency   . Secondary hyperparathyroidism ([place])   . Rotator cuff disorder   . Discitis of lumbar region     resolved  . Obstructive sleep apnea     not using CPAP- lost 100lbs  . Claustrophobia   . Diabetes mellitus     Type II  . Diabetic peripheral neuropathy ([place])   . GERD (gastroesophageal  reflux disease)   . Sepsis ([place])     Postive blood cultures -   . Endocarditis 11/28/14  . Complication of anesthesia     "they said something at [facility] it was affected by sleep aqpnea."  . Chronic kidney disease     Kidney Transplant 2009 09/26/15- transplanated kidney failing  . Chronic renal allograft nephropathy   . Chronic kidney disease (CKD), stage IV (severe) (HCC)    Past Surgical History  Procedure Laterality Date  . Kidney transplant  2009  . Cataract extraction w/ intraocular lens  implant, bilateral Bilateral   . Av fistula placement Bilateral 2015  . Arteriovenous graft placement Bilateral "several"  . Cystoscopy w/ internal urethrotomy  09/2014  . Bascilic vein transposition Right 09/27/2015  . Bascilic vein transposition Right 09/27/2015    Procedure: BASILIC VEIN TRANSPOSITION right;  Surgeon: Mal Misty, MD;  Location: [facility];  Service: Vascular;  Laterality: Right;   Family History  Problem Relation Age of Onset  . Hypertension Mother   . Diabetes Father    Social History  Substance Use Topics  .  Smoking status: Never Smoker   . Smokeless tobacco: Never Used  . Alcohol Use: No    Review of Systems  All other systems reviewed and are negative.     Allergies  Penicillins; Pork-derived products; and Ace inhibitors  Home Medications   Prior to Admission medications   Medication Sig Start Date End Date Taking? Authorizing Provider  ACCU-CHEK FASTCLIX LANCETS MISC Use to check blood sugar 4 times per day dx code E11.9 04/09/15   Elayne Snare, MD  aspirin 325 MG tablet Take 81 mg by mouth daily.     Historical Provider, MD  calcitRIOL (ROCALTROL) 0.5 MCG capsule Take 1.5 mcg by mouth daily. Takes 3 tablets daily.    Historical Provider, MD  clonazePAM (KLONOPIN) 0.5 MG tablet Take 1 mg by mouth at bedtime.     Historical Provider, MD  cloNIDine (CATAPRES) 0.3 MG tablet Take 0.3 mg by mouth 3 (three) times daily.     Historical Provider, MD   diazepam (VALIUM) 5 MG tablet Take 1 tablet (5 mg total) by mouth 2 (two) times daily. 09/14/15   Jaime Pilcher Ward, PA-C  Febuxostat (ULORIC) 80 MG TABS Take 80 mg by mouth daily.    Historical Provider, MD  fluconazole (DIFLUCAN) 50 MG tablet Take 50 mg by mouth every Monday, Wednesday, and Friday.     Historical Provider, MD  furosemide (LASIX) 80 MG tablet Take 2 tablets by mouth 2 (two) times daily. 12/04/14   Historical Provider, MD  glucose blood (ACCU-CHEK SMARTVIEW) test strip Use as instructed to check blood sugar 4 times per day dx code E11.9 06/25/15   Elayne Snare, MD  HYDROcodone-acetaminophen (NORCO/VICODIN) 5-325 MG tablet Take 1 tablet by mouth every 6 (six) hours as needed for severe pain. 09/27/15   Samantha J Rhyne, PA-C  Insulin Glargine (TOUJEO SOLOSTAR) 300 UNIT/ML SOPN Inject 90 Units into the skin 2 (two) times daily. 08/26/15   Elayne Snare, MD  Insulin Pen Needle (BD PEN NEEDLE NANO U/F) 32G X 4 MM MISC Use 5 per day 05/16/15   Elayne Snare, MD  insulin regular human CONCENTRATED (HUMULIN R U-500 KWIKPEN) 500 UNIT/ML kwikpen Inject 10 Units into the skin once. U-500 insulin 10 before bfst, 25 before lunch and 20 before dinner, take 30 min before Sliding scale use for high sugars:  Sugar 150-199 take 2 units, 200-250= 4 units and over 250 take 6 units 08/26/15   Elayne Snare, MD  mycophenolate (CELLCEPT) 250 MG capsule Take 750 mg by mouth 2 (two) times daily.     Historical Provider, MD  omeprazole (PRILOSEC) 20 MG capsule Take 20 mg by mouth daily.    Historical Provider, MD  ondansetron (ZOFRAN) 8 MG tablet Take 1 tablet (8 mg total) by mouth every 8 (eight) hours as needed for nausea or vomiting. 09/14/15   Jola Schmidt, MD  predniSONE (DELTASONE) 5 MG tablet Take 5 mg by mouth daily.      Historical Provider, MD  pregabalin (LYRICA) 100 MG capsule Take 100 mg by mouth every evening.     Historical Provider, MD  rosuvastatin (CRESTOR) 20 MG tablet Take 1 tablet (20 mg total) by  mouth at bedtime. 04/11/15   Hoyt Koch, MD  sulfamethoxazole-trimethoprim (BACTRIM,SEPTRA) 400-80 MG tablet Take 1 tablet by mouth 3 (three) times a week. Takes 1 tablet M/W/F.    Historical Provider, MD  tacrolimus (PROGRAF) 0.5 MG capsule Take 2.5 mg by mouth 2 (two) times daily. Takes 5 capsules 2 times a  day    Historical Provider, MD  tadalafil (CIALIS) 5 MG tablet Take 5 mg by mouth daily as needed for erectile dysfunction.    Historical Provider, MD  valGANciclovir (VALCYTE) 450 MG tablet Take 450 mg by mouth. Takes 1 tablet  M/W/F.    Historical Provider, MD   BP 187/76 mmHg  Pulse 90  Temp(Src) 97.7 F (36.5 C) (Oral)  Resp 26  SpO2 100% Physical Exam  Constitutional: He appears well-nourished. No distress.  HENT:  Head: Normocephalic and atraumatic.  Right Ear: External ear normal.  Left Ear: External ear normal.  Eyes: Conjunctivae are normal. Right eye exhibits no discharge. Left eye exhibits no discharge. No scleral icterus.  Neck: Neck supple. No tracheal deviation present.  Cardiovascular: Normal rate, regular rhythm and intact distal pulses.   Pulmonary/Chest: Effort normal. No stridor. No respiratory distress. He has no wheezes. He has rales in the right lower field and the left lower field.  Abdominal: Soft. Bowel sounds are normal. He exhibits no distension. There is no tenderness. There is no rebound and no guarding.  Musculoskeletal: He exhibits edema. He exhibits no tenderness.  Edema bilateral lower extremity, no calf tenderness  Neurological: He is alert. He has normal strength. No cranial nerve deficit (no facial droop, extraocular movements intact, no slurred speech) or sensory deficit. He exhibits normal muscle tone. He displays no seizure activity. Coordination normal.  Skin: Skin is warm and dry. No rash noted.  Psychiatric: He has a normal mood and affect.  Nursing note and vitals reviewed.   ED Course  Procedures (including critical care  time) CRITICAL CARE Performed by: [id number] Total critical care time: 30 minutes Critical care time was exclusive of separately billable procedures and treating other patients. Critical care was necessary to treat or prevent imminent or life-threatening deterioration. Critical care was time spent personally by me on the following activities: development of treatment plan with patient and/or surrogate as well as nursing, discussions with consultants, evaluation of patient's response to treatment, examination of patient, obtaining history from patient or surrogate, ordering and performing treatments and interventions, ordering and review of laboratory studies, ordering and review of radiographic studies, pulse oximetry and re-evaluation of patient's condition.  Labs Review Labs Reviewed  BASIC METABOLIC PANEL - Abnormal; Notable for the following:    Potassium 5.3 (*)    Chloride 112 (*)    CO2 21 (*)    BUN 62 (*)    Creatinine, Ser 3.52 (*)    Calcium 8.3 (*)    GFR calc non Af Amer 19 (*)    GFR calc Af Amer 22 (*)    All other components within normal limits  CBC - Abnormal; Notable for the following:    RBC 3.85 (*)    Hemoglobin 10.4 (*)    HCT 35.1 (*)    MCHC 29.6 (*)    All other components within normal limits  TROPONIN I - Abnormal; Notable for the following:    Troponin I 0.06 (*)    All other components within normal limits  BRAIN NATRIURETIC PEPTIDE - Abnormal; Notable for the following:    B Natriuretic Peptide 1430.7 (*)    All other components within normal limits  D-DIMER, QUANTITATIVE (NOT AT [facility]) - Abnormal; Notable for the following:    D-Dimer, Quant 2.53 (*)    All other components within normal limits    Imaging Review Dg Chest 2 View  10/07/2015  CLINICAL DATA:  Pt reports shortness of breath  progressively worse x 3 days, worse at night. Pt had surgery on 12/16 for dialysis fistula; h/o HTN and DM; non-smoker EXAM: CHEST - 2 VIEW COMPARISON:  01/15/2014  FINDINGS: Moderate cardiomegaly. New central pulmonary vascular congestion. Bilateral interstitial edema or infiltrates, new since prior study. Small bilateral pleural effusions, new since previous exam. No pneumothorax. Visualized skeletal structures are unremarkable. IMPRESSION: 1. Cardiomegaly with new small effusions, bilateral interstitial edema and vascular congestion suggesting CHF. Electronically Signed   By: Lucrezia Europe M.D.   On: 10/07/2015 12:10   I have personally reviewed and evaluated these images and lab results as part of my medical decision-making.   EKG Interpretation   Date/Time:  Monday October 07 2015 10:44:50 EST Ventricular Rate:  92 PR Interval:  182 QRS Duration: 152 QT Interval:  440 QTC Calculation: 544 R Axis:   -65 Text Interpretation:  Sinus rhythm with Premature atrial complexes  Possible Left atrial enlargement Left axis deviation Right bundle branch  block , new since last tracing Anterior infarct , age undetermined T wave  abnormality, consider lateral ischemia Abnormal ECG T wave inversion and  anterior st elevation noted on 15 January 2014 not present on current ECG  Confirmed by Kannon Granderson  MD-J, Alfonso Carden [id number]) on 10/07/2015 11:02:21 AM      MDM   Final diagnoses:  DVT (deep venous thrombosis), left  CRF (chronic renal failure), stage 5 (HCC)  Acute on chronic congestive heart failure, unspecified congestive heart failure type [facility])    Patient presented to the emergency room with complaints of worsening shortness of breath. His laboratory studies, chest x-ray and vascular ultrasound of his left lower extremity were reviewed. Patient's hemoglobin and hematocrit are stable. His chronic renal insufficiency is stable. He does have an elevated troponin and BNP. Could be associated with congestive heart failure as suggested by his chest x-ray.  His Doppler study demonstrates an acute left sided DVT. It is possible he has an acute pulmonary embolism. Patient will  require a V/Q scan but this may be difficult to interpret considering his body habitus and his abnormal chest x-ray findings.  She will be started on anticoagulants. Pharmacy is assisting with the appropriate medication concerning the patient states he has a pork allergy and heparin and Lovenox are porcne derived.  I will consult with the medical service for admission and further treatment.    Dorie Rank, MD 10/07/15 [phone]

## 2015-10-08 ENCOUNTER — Inpatient Hospital Stay (HOSPITAL_COMMUNITY): Payer: Medicare Other

## 2015-10-08 DIAGNOSIS — J810 Acute pulmonary edema: Secondary | ICD-10-CM | POA: Diagnosis present

## 2015-10-08 DIAGNOSIS — E875 Hyperkalemia: Secondary | ICD-10-CM

## 2015-10-08 LAB — BASIC METABOLIC PANEL
Anion gap: 9 (ref 5–15)
BUN: 67 mg/dL — ABNORMAL HIGH (ref 6–20)
CO2: 25 mmol/L (ref 22–32)
Calcium: 7.8 mg/dL — ABNORMAL LOW (ref 8.9–10.3)
Chloride: 110 mmol/L (ref 101–111)
Creatinine, Ser: 3.87 mg/dL — ABNORMAL HIGH (ref 0.61–1.24)
GFR calc Af Amer: 19 mL/min — ABNORMAL LOW (ref >60)
GFR calc non Af Amer: 17 mL/min — ABNORMAL LOW (ref >60)
Glucose, Bld: 250 mg/dL — ABNORMAL HIGH (ref 65–99)
Potassium: 4.8 mmol/L (ref 3.5–5.1)
Sodium: 144 mmol/L (ref 135–145)

## 2015-10-08 LAB — GLUCOSE, CAPILLARY
Glucose-Capillary: 215 mg/dL — ABNORMAL HIGH (ref 65–99)
Glucose-Capillary: 194 mg/dL — ABNORMAL HIGH (ref 65–99)
Glucose-Capillary: 178 mg/dL — ABNORMAL HIGH (ref 65–99)
Glucose-Capillary: 305 mg/dL — ABNORMAL HIGH (ref 65–99)

## 2015-10-08 LAB — CBC
HCT: 29.8 % — ABNORMAL LOW (ref 39.0–52.0)
Hemoglobin: 9 g/dL — ABNORMAL LOW (ref 13.0–17.0)
MCH: 27.9 pg (ref 26.0–34.0)
MCHC: 30.2 g/dL (ref 30.0–36.0)
MCV: 92.3 fL (ref 78.0–100.0)
Platelets: 217 10*3/uL (ref 150–400)
RBC: 3.23 MIL/uL — ABNORMAL LOW (ref 4.22–5.81)
RDW: 15.1 % (ref 11.5–15.5)
WBC: 4.1 10*3/uL (ref 4.0–10.5)

## 2015-10-08 LAB — TROPONIN I: Troponin I: 0.17 ng/mL — ABNORMAL HIGH (ref <0.031)

## 2015-10-08 LAB — APTT
aPTT: 64 seconds — ABNORMAL HIGH (ref 24–37)
aPTT: 69 seconds — ABNORMAL HIGH (ref 24–37)

## 2015-10-08 MED ORDER — INSULIN ASPART 100 UNIT/ML ~~LOC~~ SOLN
5.0000 [IU] | Freq: Three times a day (TID) | SUBCUTANEOUS | Status: DC
  Administered 2015-10-08 – 2015-10-13 (×14): 5 [IU] via SUBCUTANEOUS

## 2015-10-08 MED ORDER — INSULIN GLARGINE 100 UNIT/ML ~~LOC~~ SOLN
25.0000 [IU] | Freq: Every day | SUBCUTANEOUS | Status: DC
  Administered 2015-10-08 – 2015-10-11 (×4): 25 [IU] via SUBCUTANEOUS
  Filled 2015-10-08 (×5): qty 0.25

## 2015-10-08 MED ORDER — TECHNETIUM TC 99M DIETHYLENETRIAME-PENTAACETIC ACID: 31.5000 | Freq: Once | INTRAVENOUS | Status: DC | PRN

## 2015-10-08 MED ORDER — INSULIN ASPART 100 UNIT/ML ~~LOC~~ SOLN
0.0000 [IU] | Freq: Three times a day (TID) | SUBCUTANEOUS | Status: DC
  Administered 2015-10-08 – 2015-10-09 (×3): 3 [IU] via SUBCUTANEOUS
  Administered 2015-10-09: 11 [IU] via SUBCUTANEOUS
  Administered 2015-10-10: 2 [IU] via SUBCUTANEOUS
  Administered 2015-10-10: 8 [IU] via SUBCUTANEOUS
  Administered 2015-10-10 – 2015-10-11 (×2): 11 [IU] via SUBCUTANEOUS
  Administered 2015-10-11 – 2015-10-12 (×3): 5 [IU] via SUBCUTANEOUS
  Administered 2015-10-12: 8 [IU] via SUBCUTANEOUS
  Administered 2015-10-12 – 2015-10-13 (×2): 11 [IU] via SUBCUTANEOUS
  Administered 2015-10-13: 5 [IU] via SUBCUTANEOUS

## 2015-10-08 MED ORDER — TECHNETIUM TO 99M ALBUMIN AGGREGATED
4.1000 | Freq: Once | INTRAVENOUS | Status: AC | PRN
  Administered 2015-10-08: 4 via INTRAVENOUS

## 2015-10-08 MED ORDER — AMLODIPINE BESYLATE 5 MG PO TABS
5.0000 mg | ORAL_TABLET | Freq: Every day | ORAL | Status: DC
  Administered 2015-10-08 – 2015-10-12 (×5): 5 mg via ORAL
  Filled 2015-10-08 (×5): qty 1

## 2015-10-08 MED ORDER — FUROSEMIDE 10 MG/ML IJ SOLN
80.0000 mg | Freq: Three times a day (TID) | INTRAMUSCULAR | Status: DC
  Administered 2015-10-08 – 2015-10-09 (×3): 80 mg via INTRAVENOUS
  Filled 2015-10-08 (×3): qty 8

## 2015-10-08 NOTE — Progress Notes (Signed)
[place] for Argatroban Indication: DVT  Allergies  Allergen Reactions  . Penicillins Rash and Other (See Comments)    Has patient had a PCN reaction causing immediate rash, facial/tongue/throat swelling, SOB or lightheadedness with hypotension:  Has patient had a PCN reaction causing severe rash involving mucus membranes or skin necrosis:  Has patient had a PCN reaction that required hospitalization  Has patient had a PCN reaction occurring within the last 10 years:  If all of the above answers are "NO", then may proceed with Cephalosporin use.  . Pork-Derived Products Anaphylaxis and Other (See Comments)    Fever also   . Ace Inhibitors Cough    Patient Measurements: Height: 6' (182.9 cm) Weight: 268 lb 3.2 oz (121.655 kg) IBW/kg (Calculated) : 77.6  Vital Signs: Temp: 98.1 F (36.7 C) (12/27 0042) Temp Source: Oral (12/27 0042) BP: 173/62 mmHg (12/27 0042) Pulse Rate: 91 (12/27 0042)  Labs:  Recent Labs  10/07/15 1145  10/07/15 1810 10/07/15 2136 10/08/15 0054  HGB 10.4*  --   --   --   --   HCT 35.1*  --   --   --   --   PLT 255  --   --   --   --   APTT  --   < > 54* 45* 64*  CREATININE 3.52*  --   --   --   --   TROPONINI 0.06*  --   --   --   --   < > = values in this interval not displayed.  Estimated Creatinine Clearance: 33.4 mL/min (by C-G formula based on Cr of 3.52).  Assessment: 49 yo Male with DVT for argatroban  Goal of Therapy:  aPTT 50-90 seconds Monitor platelets by anticoagulation protocol: Yes   Plan:  Continue argatroban at current rate Follow-up am labs.  Phillis Knack, PharmD, BCPS

## 2015-10-08 NOTE — Progress Notes (Addendum)
Inpatient Diabetes Program Recommendations  AACE/ADA: New Consensus Statement on Inpatient Glycemic Control (2015)  Target Ranges:  Prepandial:   less than 140 mg/dL      Peak postprandial:   less than 180 mg/dL (1-2 hours)      Critically ill patients:  140 - 180 mg/dL   Review of Glycemic ControlResults for TAEO, SHELLHAMMER (MRN [id number]) as of 10/08/2015 10:59  Ref. Range 10/07/2015 16:54 10/07/2015 21:12 10/08/2015 06:12  Glucose-Capillary Latest Ref Range: 65-99 mg/dL 159 (H) 215 (H) 215 (H)    Diabetes history: Type 2 diabetes Outpatient Diabetes medications: Toujeo 90 units bid, U500 10 units with breakfast, 25 units with lunch, and 20 units with dinner Current orders for Inpatient glycemic control:  Novolog resistant tid with meals  Inpatient Diabetes Program Recommendations:    Please consider restarting a portion of patient's home dose of basal insulin.  Consider Lantus 25 units daily and Novolog 5 units tid with meals.   Thanks, Adah Perl, RN, BC-ADM Inpatient Diabetes Coordinator Pager [phone] (8a-5p)  ** Based on home insulin dosages, it appears patient would need larger doses of insulin however CBG at lunch today was only 194 mg/dL.  Text paged Dr. Grandville Silos regarding possible restart of a portion of Lantus/Novolog.  Will follow.  Patient see's Dr. Dwyane Dee for diabetes outpatient.  He will need to see him after d/c regarding home diabetes medications.

## 2015-10-08 NOTE — Progress Notes (Signed)
[facility] PROGRESS NOTE  [name] [name] [id number] DOB: 1966-05-31 DOA: 10/07/2015 PCP: Hoyt Koch, MD  Assessment/Plan: #1 acute DVT left popliteal vein Questionable etiology. Patient with an allergy to pork derived products and as such patient has been started on argatroban. VQ scan negative for PE. Likely need to transition patient to Coumadin for anticoagulation in the next 1-2 days.  #2 shortness of breath/pulmonary edema/acute on chronic CHF exacerbation Likely secondary to progressive chronic kidney disease. Patient with bibasilar crackles. Chest x-ray on admission consistent with vascular congestion. Change oral Lasix to Lasix 80 mg IV every 8 hours. Consult with nephrology.  #3 chronic kidney disease stage 4/5/history of renal transplant 2009 Patient with creatinine currently at 3.87. Patient with recent fistula placement in right upper extremity. Patient with volume overload noted on examination and per chest x-ray. Change oral Lasix to IV Lasix 80 mg every 8 hours. Continue Prograf and prednisone and CellCept and found ganciclovir and Bactrim. Nephrology consultation.  #4 diabetes mellitus type 2 Hemoglobin A1c was 6.5 on 08/26/2015. Place patient on Lantus 25 units daily. NovoLog 5 units 3 times daily with meals. Sliding scale insulin. Outpatient follow-up with endocrinology  #5 hypertension Stable. Continue clonidine, on IV Lasix. Add Norvasc. Follow.  #6 hypokalemia Resolved.  #7 history of enterococcus bacteremia/aortic valve endocarditis with severe aortic insufficiency at [facility] earlier this year Surgery was recommended but patient declined. Repeat 2-D echo July revealed only mild to moderate aortic insufficiency.  #8 obstructive sleep apnea  #9 anxiety Continue home regimen of anxiolytics  #10 prophylaxis Argatroban for DVT prophylaxis.   Code Status: Full Family Communication: Updated patient. No family at bedside. Disposition Plan: Home  when medically stable.   Consultants:  Nephrology pending  Procedures:  Lower extremity Dopplers 10/07/2015  VQ scan 10/08/2015  Chest x-ray 10/07/2015    Antibiotics:  Chronic Bactrim started prior to admission  Chronic valganciclovir started prior to admission  HPI/Subjective: Patient states shortness of breath has improved since admission. Patient complaining of some right-sided chest discomfort.  Objective: Filed Vitals:   10/08/15 0042 10/08/15 0620  BP: 173/62 171/50  Pulse: 91 84  Temp: 98.1 F (36.7 C) 97.7 F (36.5 C)  Resp: 20 20    Intake/Output Summary (Last 24 hours) at 10/08/15 1043 Last data filed at 10/08/15 0852  Gross per 24 hour  Intake 833.11 ml  Output   1285 ml  Net -451.89 ml   Filed Weights   10/07/15 1643 10/08/15 0600  Weight: 121.655 kg (268 lb 3.2 oz) 128.8 kg (283 lb 15.2 oz)    Exam:   General:  NAD  Cardiovascular: RRR  Respiratory: Bibasilar crackles  Abdomen: Soft, nontender, nondistended, positive bowel sounds.  Musculoskeletal: No clubbing or cyanosis. 1+ left lower extremity edema. Graft in right upper extremity with good thrill.  Data Reviewed: Basic Metabolic Panel:  Recent Labs Lab 10/07/15 1145 10/08/15 0558  NA 145 144  K 5.3* 4.8  CL 112* 110  CO2 21* 25  GLUCOSE 75 250*  BUN 62* 67*  CREATININE 3.52* 3.87*  CALCIUM 8.3* 7.8*   Liver Function Tests: No results for input(s): AST, ALT, ALKPHOS, BILITOT, PROT, ALBUMIN in the last 168 hours. No results for input(s): LIPASE, AMYLASE in the last 168 hours. No results for input(s): AMMONIA in the last 168 hours. CBC:  Recent Labs Lab 10/07/15 1145 10/08/15 0558  WBC 5.2 4.1  HGB 10.4* 9.0*  HCT 35.1* 29.8*  MCV 91.2 92.3  PLT 255 217  Cardiac Enzymes:  Recent Labs Lab 10/07/15 1145  TROPONINI 0.06*   BNP (last 3 results)  Recent Labs  10/07/15 1145  BNP 1430.7*    ProBNP (last 3 results) No results for input(s): PROBNP in  the last 8760 hours.  CBG:  Recent Labs Lab 10/07/15 1654 10/07/15 2112 10/08/15 0612  GLUCAP 159* 215* 215*    No results found for this or any previous visit (from the past 240 hour(s)).   Studies: Dg Chest 2 View  10/07/2015  CLINICAL DATA:  Pt reports shortness of breath progressively worse x 3 days, worse at night. Pt had surgery on 12/16 for dialysis fistula; h/o HTN and DM; non-smoker EXAM: CHEST - 2 VIEW COMPARISON:  01/15/2014 FINDINGS: Moderate cardiomegaly. New central pulmonary vascular congestion. Bilateral interstitial edema or infiltrates, new since prior study. Small bilateral pleural effusions, new since previous exam. No pneumothorax. Visualized skeletal structures are unremarkable. IMPRESSION: 1. Cardiomegaly with new small effusions, bilateral interstitial edema and vascular congestion suggesting CHF. Electronically Signed   By: Lucrezia Europe M.D.   On: 10/07/2015 12:10    Scheduled Meds: . amLODipine  5 mg Oral Daily  . aspirin EC  81 mg Oral Daily  . calcitRIOL  1.5 mcg Oral Daily  . cloNIDine  0.3 mg Oral TID  . diazepam  5 mg Oral BID  . febuxostat  80 mg Oral Daily  . feeding supplement (ENSURE ENLIVE)  237 mL Oral BID BM  . [START ON 10/09/2015] fluconazole  50 mg Oral Q M,W,F  . furosemide  80 mg Intravenous 3 times per day  . insulin aspart  0-20 Units Subcutaneous TID WC  . mycophenolate  750 mg Oral BID  . pantoprazole  40 mg Oral Daily  . predniSONE  5 mg Oral Q breakfast  . pregabalin  100 mg Oral QHS  . rosuvastatin  20 mg Oral QHS  . sodium chloride  3 mL Intravenous Q12H  . [START ON 10/09/2015] sulfamethoxazole-trimethoprim  1 tablet Oral Once per day on Mon Wed Fri  . tacrolimus  2.5 mg Oral BID  . [START ON 10/09/2015] valGANciclovir  450 mg Oral Once per day on Mon Wed Fri   Continuous Infusions: . argatroban 1.2 mcg/kg/min (10/08/15 [id number])    Principal Problem:   Deep vein thrombosis (DVT) of left lower extremity (HCC) Active  Problems:   Acute on chronic congestive heart failure (HCC)   Obstructive sleep apnea   KIDNEY TRANSPLANTATION, HX OF   Benign essential HTN   H/O kidney transplant   Diabetes mellitus, type 2 ([place])   Diabetic peripheral neuropathy (HCC)   Chronic kidney disease (CKD), stage IV (severe) (HCC)   CHF (congestive heart failure) (HCC)   Hyperkalemia   Prolonged Q-T interval on ECG   Chronic deep vein thrombosis (DVT) of left lower extremity ([place])   Acute pulmonary edema ([place])    Time spent: 40 mins    [facility] MD [facility] Pager [phone]. If 7PM-7AM, please contact night-coverage at [email or web address], password [facility] 10/08/2015, 10:43 AM  LOS: 1 day

## 2015-10-08 NOTE — Progress Notes (Signed)
Nutrition Brief Note  Patient identified on the Malnutrition Screening Tool (MST) Report.  Wt Readings from Last 15 Encounters:  10/08/15 283 lb 15.2 oz (128.8 kg)  09/27/15 260 lb (117.935 kg)  09/24/15 260 lb (117.935 kg)  08/26/15 274 lb 3.2 oz (124.376 kg)  06/25/15 269 lb 3.2 oz (122.108 kg)  05/15/15 256 lb (116.121 kg)  04/24/15 265 lb 9.6 oz (120.475 kg)  04/22/15 261 lb (118.389 kg)  04/11/15 265 lb (120.203 kg)  04/09/15 260 lb 9.6 oz (118.207 kg)  04/05/15 263 lb (119.296 kg)  11/06/14 258 lb (117.028 kg)  11/01/14 258 lb (117.028 kg)  10/28/14 262 lb (118.842 kg)  10/16/14 271 lb (122.925 kg)    Body mass index is 38.5 kg/(m^2). Patient meets criteria for Obesity Class II based on current BMI.   Current diet order is Heart Healthy, patient is consuming approximately 100% of meals at this time. Labs and medications reviewed.   No nutrition interventions warranted at this time. If nutrition issues arise, please consult RD.   Arthur Holms, RD, LDN Pager #: [phone] After-Hours Pager #: [phone]

## 2015-10-08 NOTE — Consult Note (Signed)
[name] [name] Admit Date: 10/07/2015 10/08/2015 [name] Requesting Physician:  Irine Seal MD  Reason for Consult:  comanagement of CKD4 and Kidney Transplant, DVT HPI:  49 year old male seen at request of Dr. Grandville Silos for the evaluation of the above issues. Patient is followed by Dr. Lorrene Reid in our clinic. He has a complicated past renal history. He received a deceased donor kidney transplant in 2009 at [facility] with a primary disease of diabetes.  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 He was continued on prednisone and tacrolimus. The patient had a subsequent worsening in his renal function and underwent transplant biopsy in August of this year demonstrating acute cellular rejection with concern for antibody mediated process and a new donor specific antibody. He was treated with IVIG and pulse steroids. He has had progressive loss of kidney function with most recent baseline between 3 and 3.6. Because of his advancing CKD he underwent right basilic vein transposition last week. Current immunosuppression is tacrolimus 2.5 mg twice daily, prednisone 5 mg daily, CellCept 750 mg twice daily. It is felt the patient has chronic allograft nephropathy with high risk of progressing to dialysis dependence.   Renal function so far during this admission has been stable and consistent with recent previous values.   Regarding his aortic valve there was some concern that he would need replacement but most recent evaluation has demonstrated only moderate aortic insufficiency.  The patient was admitted yesterday with worsening swelling in the left leg and acute onset of shortness of breath. Lower extremity venous Dopplers demonstrated an acute DVT in the popliteal vein. He has a porcine allergy and was placed on argatroban.  Attempts are being made to obtain a VQ scan.  Maintenance diuretics  are Lasix 80 mg by mouth twice daily as an outpatient. Current inpatient diuretic regimen is 80 mg IV 3 times a day. His left leg is swollen but the right leg has much less edema. Two-view chest x-ray obtained on admission demonstrated small pleural effusions and edema. It appears that his baseline weight ranges from 255-270 pounds. Weight is 280 pounds upon admission.  He expresses numerous frustrations about the care he has received over the past 12 months.   CREATININE, SER (mg/dL)  Date Value  10/08/2015 3.87*  10/07/2015 3.52*  09/13/2015 3.60*  08/21/2015 3.27*  06/19/2015 3.18*  11/22/2014 1.90*  11/22/2014 2.09*  10/30/2014 2.50*  10/29/2014 2.65*  10/28/2014 2.64*  ] I/Os:  ROS Balance of 12 systems is negative w/ exceptions as above  PMH  Past Medical History  Diagnosis Date  . Hypertension   . Leg pain 02/05/11    with walking  . Weight gain   . Shortness of breath     when lying flat  . Diminished eyesight   . Hearing difficulty   . Anemia   . Nervousness(799.21)   . COPD (chronic obstructive pulmonary disease) ([place])   . CHF (congestive heart failure) ([place])   . Posttraumatic stress disorder   . Allergic rhinitis   . Dyslipidemia   . Morbid obesity ([place])   . Gait disorder   . Bilateral carpal tunnel syndrome   . Gastroparesis   . Gout   . Diabetic retinopathy ([place])   . Renal transplant, status post 04/05/2014  . Incomplete bladder emptying   . Urethral stricture   . Aortic insufficiency     a. Echo 7/16:  Mod  LVH, EF 50-55%, mild to mod AI, severe LAE, PASP 57 mmHg (LV ID end diastolic [id number] mm)  . Hyperlipidemia   . Osteomyelitis ([place])   . Endocarditis   . Vitamin D deficiency   . Secondary hyperparathyroidism ([place])   . Rotator cuff disorder   . Discitis of lumbar region     resolved  . Obstructive sleep apnea     not using CPAP- lost 100lbs  . Claustrophobia   . Diabetes mellitus     Type II  . Diabetic peripheral neuropathy ([place])   . GERD  (gastroesophageal reflux disease)   . Sepsis ([place])     Postive blood cultures -   . Endocarditis 11/28/14  . Complication of anesthesia     "they said something at [facility] it was affected by sleep aqpnea."  . Chronic kidney disease     Kidney Transplant 2009 09/26/15- transplanated kidney failing  . Chronic renal allograft nephropathy   . Chronic kidney disease (CKD), stage IV (severe) ([place])   . DVT (deep venous thrombosis) ([place]) 10/07/2015    LLE   PSH  Past Surgical History  Procedure Laterality Date  . Kidney transplant  2009  . Cataract extraction w/ intraocular lens  implant, bilateral Bilateral   . Av fistula placement Bilateral 2015  . Arteriovenous graft placement Bilateral "several"  . Cystoscopy w/ internal urethrotomy  09/2014  . Bascilic vein transposition Right 09/27/2015  . Bascilic vein transposition Right 09/27/2015    Procedure: BASILIC VEIN TRANSPOSITION right;  Surgeon: Mal Misty, MD;  Location: [facility] OR;  Service: Vascular;  Laterality: Right;   FH  Family History  Problem Relation Age of Onset  . Hypertension Mother   . Diabetes Father    SH  reports that he has never smoked. He has never used smokeless tobacco. He reports that he does not drink alcohol or use illicit drugs. Allergies   Home medications Prior to Admission medications   Medication Sig Start Date End Date Taking? Authorizing Provider  aspirin EC 81 MG tablet Take 81 mg by mouth daily.   Yes Historical Provider, MD  calcitRIOL (ROCALTROL) 0.5 MCG capsule Take 1.5 mcg by mouth daily. Takes 3 tablets daily.   Yes Historical Provider, MD  clonazePAM (KLONOPIN) 0.5 MG tablet Take 1 mg by mouth at bedtime.    Yes Historical Provider, MD  cloNIDine (CATAPRES) 0.3 MG tablet Take 0.3 mg by mouth 3 (three) times daily.    Yes Historical Provider, MD  Febuxostat (ULORIC) 80 MG TABS Take 80 mg by mouth daily.   Yes Historical Provider, MD  fluconazole (DIFLUCAN) 50 MG tablet Take 50 mg by mouth  every Monday, Wednesday, and Friday.    Yes Historical Provider, MD  furosemide (LASIX) 80 MG tablet Take 2 tablets by mouth 2 (two) times daily. 12/04/14  Yes Historical Provider, MD  HYDROcodone-acetaminophen (NORCO/VICODIN) 5-325 MG tablet Take 1 tablet by mouth every 6 (six) hours as needed for severe pain. 09/27/15  Yes Samantha J Rhyne, PA-C  Insulin Glargine (TOUJEO SOLOSTAR) 300 UNIT/ML SOPN Inject 90 Units into the skin 2 (two) times daily. 08/26/15  Yes Elayne Snare, MD  insulin regular human CONCENTRATED (HUMULIN R U-500 KWIKPEN) 500 UNIT/ML kwikpen Inject 10 Units into the skin once. U-500 insulin 10 before bfst, 25 before lunch and 20 before dinner, take 30 min before Sliding scale use for high sugars:  Sugar 150-199 take 2 units, 200-250= 4 units and over 250 take 6 units 08/26/15  Yes Elayne Snare, MD  mycophenolate (CELLCEPT) 250 MG capsule Take 750 mg by mouth 2 (two) times daily.    Yes Historical Provider, MD  omeprazole (PRILOSEC) 20 MG capsule Take 20 mg by mouth daily.   Yes Historical Provider, MD  predniSONE (DELTASONE) 5 MG tablet Take 5 mg by mouth daily.     Yes Historical Provider, MD  pregabalin (LYRICA) 100 MG capsule Take 100 mg by mouth every evening.    Yes Historical Provider, MD  rosuvastatin (CRESTOR) 20 MG tablet Take 1 tablet (20 mg total) by mouth at bedtime. 04/11/15  Yes Hoyt Koch, MD  tacrolimus (PROGRAF) 0.5 MG capsule Take 2.5 mg by mouth 2 (two) times daily. Takes 5 capsules 2 times a day   Yes Historical Provider, MD  tadalafil (CIALIS) 5 MG tablet Take 5 mg by mouth daily as needed for erectile dysfunction.   Yes Historical Provider, MD  valGANciclovir (VALCYTE) 450 MG tablet Take 450 mg by mouth. Takes 1 tablet  M/W/F.   Yes Historical Provider, MD  ACCU-CHEK FASTCLIX LANCETS MISC Use to check blood sugar 4 times per day dx code E11.9 04/09/15   Elayne Snare, MD  diazepam (VALIUM) 5 MG tablet Take 1 tablet (5 mg total) by mouth 2 (two) times daily.  09/14/15   Ozella Almond Ward, PA-C  glucose blood (ACCU-CHEK SMARTVIEW) test strip Use as instructed to check blood sugar 4 times per day dx code E11.9 06/25/15   Elayne Snare, MD  Insulin Pen Needle (BD PEN NEEDLE NANO U/F) 32G X 4 MM MISC Use 5 per day 05/16/15   Elayne Snare, MD  ondansetron (ZOFRAN) 8 MG tablet Take 1 tablet (8 mg total) by mouth every 8 (eight) hours as needed for nausea or vomiting. 09/14/15   Jola Schmidt, MD  sulfamethoxazole-trimethoprim (BACTRIM,SEPTRA) 400-80 MG tablet Take 1 tablet by mouth 3 (three) times a week. Takes 1 tablet M/W/F.    Historical Provider, MD    Current Medications Scheduled Meds: . amLODipine  5 mg Oral Daily  . aspirin EC  81 mg Oral Daily  . calcitRIOL  1.5 mcg Oral Daily  . cloNIDine  0.3 mg Oral TID  . diazepam  5 mg Oral BID  . febuxostat  80 mg Oral Daily  . feeding supplement (ENSURE ENLIVE)  237 mL Oral BID BM  . [START ON 10/09/2015] fluconazole  50 mg Oral Q M,W,F  . furosemide  80 mg Intravenous 3 times per day  . insulin aspart  0-20 Units Subcutaneous TID WC  . mycophenolate  750 mg Oral BID  . pantoprazole  40 mg Oral Daily  . predniSONE  5 mg Oral Q breakfast  . pregabalin  100 mg Oral QHS  . rosuvastatin  20 mg Oral QHS  . sodium chloride  3 mL Intravenous Q12H  . [START ON 10/09/2015] sulfamethoxazole-trimethoprim  1 tablet Oral Once per day on Mon Wed Fri  . tacrolimus  2.5 mg Oral BID  . [START ON 10/09/2015] valGANciclovir  450 mg Oral Once per day on Mon Wed Fri   Continuous Infusions: . argatroban 1.2 mcg/kg/min (10/08/15 0814)   PRN Meds:.sodium chloride, acetaminophen **OR** acetaminophen, bisacodyl, clonazePAM, hydrALAZINE, HYDROcodone-acetaminophen, ondansetron **OR** ondansetron (ZOFRAN) IV, sodium chloride  CBC  Recent Labs Lab 10/07/15 1145 10/08/15 0558  WBC 5.2 4.1  HGB 10.4* 9.0*  HCT 35.1* 29.8*  MCV 91.2 92.3  PLT 255 [id number]   Basic Metabolic Panel  Recent Labs Lab 10/07/15 1145 10/08/15 0558  NA 145 144  K 5.3* 4.8  CL 112* 110  CO2 21* 25  GLUCOSE 75 250*  BUN 62* 67*  CREATININE 3.52* 3.87*  CALCIUM 8.3* 7.8*    Physical Exam  Blood pressure 108/59, pulse 80, temperature 98.7 F (37.1 C), temperature source Oral, resp. rate 16, height 6' (1.829 m), weight 128.8 kg (283 lb 15.2 oz), SpO2 90 %. GEN: NAD ENT: NCAT EYES: EOMI CV: RRR PULM: diminished in the bases ABD: s/nt/nd SKIN: no rashes/leions EXT:LLE 3+ edema, RLE 1+ edema   Assessment 61M with CKD4T, progressive, admit with acute LLE DVT and concern for PE  1. CKD4T, ddKT 2009 [facility], now with CAN, recent ACR 2. Chronic IS on Tac/MMF/Pred; Fluc/Ganciclovir/TMP-SMX 3. Acute LLE DVT on Argatroban 4. ? PE, VQ pending 5. 2HPTH on VDRA 6. Porcine Allergy 7. Anemia, normocytic 8. CHF 9. COPD 10. DM 11. Aortic Insufficiency hx/o Aortic IE 11/2014 12. Gout on febuxostat  Plan 1. Cont IS at current doses 2. Cont 80 IV TID furosemide given edema, inc weight, findings on CXR Daily weights, Daily Renal Panel, Strict I/Os, Avoid nephrotoxins (NSAIDs, judicious IV Contrast)  Pearson Grippe MD [phone] pgr 10/08/2015, 12:30 PM

## 2015-10-08 NOTE — Progress Notes (Addendum)
[place] for Argatroban Indication: DVT             Patient Measurements: Height: 6' (182.9 cm) Weight: 283 lb 15.2 oz (128.8 kg) IBW/kg (Calculated) : 77.6  Vital Signs: Temp: 97.7 F (36.5 C) (12/27 0620) Temp Source: Oral (12/27 0620) BP: 171/50 mmHg (12/27 0620) Pulse Rate: 84 (12/27 0620)  Labs:  Recent Labs  10/07/15 1145  10/07/15 2136 10/08/15 0054 10/08/15 0558  HGB 10.4*  --   --   --  9.0*  HCT 35.1*  --   --   --  29.8*  PLT 255  --   --   --  217  APTT  --   < > 45* 64* 69*  CREATININE 3.52*  --   --   --  3.87*  TROPONINI 0.06*  --   --   --   --   < > = values in this interval not displayed.  Estimated Creatinine Clearance: 31.3 mL/min (by C-G formula based on Cr of 3.87).  Assessment: 49 yo Male with DVT of LLE on argatroban due to porcine allergy.  Argatroban currently therapeutic.  Will monitor daily aptt  aptt = 45>64>69 sec Hgb 10.4>9.9, Plts 255>217. No bleeding noted  VQ scan pending.   Goal of Therapy:  aPTT 50-90 seconds Monitor platelets by anticoagulation protocol: Yes   Plan:  Continue Argatroban at 1.2 mcg/kg/min  F/u transition to PO anticoagulation Monitor s/sx bleeding Daily CBC, aPTT  Bennye Alm, PharmD Pharmacy Resident [phone]

## 2015-10-08 NOTE — Progress Notes (Signed)
Utilization review completed.  L. J. Vinod Mikesell RN, BSN, CM 

## 2015-10-08 NOTE — Clinical Documentation Improvement (Signed)
Hospitalist Please update your documentation within the medical record to reflect your response to this query.  Thank you Can the diagnosis of CHF be further specified?    Acuity - Acute, Chronic, Acute on Chronic   Type - Systolic, Diastolic, Systolic and Diastolic  Other  Clinically Undetermined  Document any associated diagnoses/conditions  Supporting Information: 10/07/15 H&P..."CHF (congestive heart failure) ([place])"..."BNP 1430.7"..."CXR:  Cardiomegaly with new small effusions, bilateral interstitial edema and vascular congestion suggesting CHF."..."-Continue home antihypertensives-Add prn hydralazine-Lasix 80mg  TID. He received 60mg  IV Lasix in ED around 2:30pm"...  Please exercise your independent, professional judgment when responding. A specific answer is not anticipated or expected.  Thank You,  Ermelinda Das, RN, BSN, [place] Certified Clinical Documentation Specialist [place]: Health Information Management [phone]

## 2015-10-08 NOTE — Progress Notes (Signed)
Pt does not want bed alarm at this time, sitting on side of bed

## 2015-10-09 DIAGNOSIS — I5033 Acute on chronic diastolic (congestive) heart failure: Secondary | ICD-10-CM

## 2015-10-09 LAB — GLUCOSE, CAPILLARY
Glucose-Capillary: 331 mg/dL — ABNORMAL HIGH (ref 65–99)
Glucose-Capillary: 162 mg/dL — ABNORMAL HIGH (ref 65–99)
Glucose-Capillary: 198 mg/dL — ABNORMAL HIGH (ref 65–99)
Glucose-Capillary: 283 mg/dL — ABNORMAL HIGH (ref 65–99)

## 2015-10-09 LAB — RENAL FUNCTION PANEL
Albumin: 2.8 g/dL — ABNORMAL LOW (ref 3.5–5.0)
Anion gap: 10 (ref 5–15)
BUN: 80 mg/dL — ABNORMAL HIGH (ref 6–20)
CO2: 24 mmol/L (ref 22–32)
Calcium: 7.6 mg/dL — ABNORMAL LOW (ref 8.9–10.3)
Chloride: 106 mmol/L (ref 101–111)
Creatinine, Ser: 4.39 mg/dL — ABNORMAL HIGH (ref 0.61–1.24)
GFR calc Af Amer: 17 mL/min — ABNORMAL LOW (ref >60)
GFR calc non Af Amer: 14 mL/min — ABNORMAL LOW (ref >60)
Glucose, Bld: 370 mg/dL — ABNORMAL HIGH (ref 65–99)
Phosphorus: 6.5 mg/dL — ABNORMAL HIGH (ref 2.5–4.6)
Potassium: 5.4 mmol/L — ABNORMAL HIGH (ref 3.5–5.1)
Sodium: 140 mmol/L (ref 135–145)

## 2015-10-09 LAB — CBC
HCT: 32.9 % — ABNORMAL LOW (ref 39.0–52.0)
Hemoglobin: 9.4 g/dL — ABNORMAL LOW (ref 13.0–17.0)
MCH: 26.5 pg (ref 26.0–34.0)
MCHC: 28.6 g/dL — ABNORMAL LOW (ref 30.0–36.0)
MCV: 92.7 fL (ref 78.0–100.0)
Platelets: 242 10*3/uL (ref 150–400)
RBC: 3.55 MIL/uL — ABNORMAL LOW (ref 4.22–5.81)
RDW: 15 % (ref 11.5–15.5)
WBC: 3.6 10*3/uL — ABNORMAL LOW (ref 4.0–10.5)

## 2015-10-09 LAB — APTT: aPTT: 62 seconds — ABNORMAL HIGH (ref 24–37)

## 2015-10-09 LAB — PROTIME-INR
INR: 2.48 — ABNORMAL HIGH (ref 0.00–1.49)
Prothrombin Time: 26.5 seconds — ABNORMAL HIGH (ref 11.6–15.2)

## 2015-10-09 LAB — TROPONIN I: Troponin I: 0.15 ng/mL — ABNORMAL HIGH (ref <0.031)

## 2015-10-09 MED ORDER — WARFARIN - PHARMACIST DOSING INPATIENT
Freq: Every day | Status: DC
Start: 2015-10-09 — End: 2015-10-14
  Administered 2015-10-11 – 2015-10-12 (×2)

## 2015-10-09 MED ORDER — FUROSEMIDE 80 MG PO TABS
80.0000 mg | ORAL_TABLET | Freq: Two times a day (BID) | ORAL | Status: DC
  Administered 2015-10-09 – 2015-10-10 (×3): 80 mg via ORAL
  Filled 2015-10-09 (×3): qty 1

## 2015-10-09 MED ORDER — WARFARIN SODIUM 2 MG PO TABS
4.0000 mg | ORAL_TABLET | Freq: Once | ORAL | Status: AC
  Administered 2015-10-09: 4 mg via ORAL
  Filled 2015-10-09: qty 2

## 2015-10-09 NOTE — Progress Notes (Signed)
Pt A/Ox4, agitated tonight, yelled at RN multiple times stating that the doctors don't know what they are doing and that he is not getting the insulin that he needs. Pt refused for NT to be in room. Pt refusing to wear yellow socks and oxygen at this time. Pt educated on fall risk as well as needing Oxygen as his O2 sat was 80% on RA. Pt states he will wear O2 when in bed.

## 2015-10-09 NOTE — Progress Notes (Signed)
Troponin 0.17, pt with no c/o chest pain at this time. K Schorr notified.

## 2015-10-09 NOTE — Progress Notes (Signed)
Shift event note:  Notified at approx 1930 regarding pt c/o (R) sided CP. RN reports pt is very vague and not a willing historian. Requested 12-lead EKG. RN notified after EKG completed and pt at this time was noted to be sleeping and w/o further c/o CP. EKG w/o acute changes and quite similar to previous EKG. Troponin was added. Just notified of resulted troponin of 0.17 that was drawn at 2300. Pt has had no further reports of CP.  Assessment/Plan:  1. Elevated troponin: in setting of a brief episode of atypical CP in pt w/ acute on chronic CHF exacerbation. ? Demand ischemia. Discussed pt w/ Dr Alcario Drought who has also reviewed EKG.  Will continue to cycle troponin's. Will continue to monitor closely on telemetry.   Jeryl Columbia, NP-C [facility] Pager [phone]

## 2015-10-09 NOTE — Progress Notes (Signed)
Pt a/o, no c/o pain, pt remains on argatroban @ 8.8 ml/hr, VSS, pt stable legs elevated

## 2015-10-09 NOTE — Progress Notes (Signed)
Admit: 10/07/2015 LOS: 2  75M with CKD4T, progressive, admit with acute LLE DVT and concern for PE  Subjective:  Remains on argatroban, started warfarin On IV furosemide   12/27 0701 - 12/28 0700 In: 1200 [P.O.:1200] Out: 950 [Urine:950]  Filed Weights   10/07/15 1643 10/08/15 0600 10/09/15 0749  Weight: 121.655 kg (268 lb 3.2 oz) 128.8 kg (283 lb 15.2 oz) 125.3 kg (276 lb 3.8 oz)    Scheduled Meds: . amLODipine  5 mg Oral Daily  . aspirin EC  81 mg Oral Daily  . calcitRIOL  1.5 mcg Oral Daily  . cloNIDine  0.3 mg Oral TID  . diazepam  5 mg Oral BID  . febuxostat  80 mg Oral Daily  . fluconazole  50 mg Oral Q M,W,F  . furosemide  80 mg Oral BID  . insulin aspart  0-15 Units Subcutaneous TID WC  . insulin aspart  5 Units Subcutaneous TID WC  . insulin glargine  25 Units Subcutaneous QHS  . mycophenolate  750 mg Oral BID  . pantoprazole  40 mg Oral Daily  . predniSONE  5 mg Oral Q breakfast  . pregabalin  100 mg Oral QHS  . rosuvastatin  20 mg Oral QHS  . sodium chloride  3 mL Intravenous Q12H  . sulfamethoxazole-trimethoprim  1 tablet Oral Once per day on Mon Wed Fri  . tacrolimus  2.5 mg Oral BID  . valGANciclovir  450 mg Oral Once per day on Mon Wed Fri  . warfarin  4 mg Oral ONCE-1800  . Warfarin - Pharmacist Dosing Inpatient   Does not apply q1800   Continuous Infusions: . argatroban 1.2 mcg/kg/min (10/09/15 0456)   PRN Meds:.sodium chloride, acetaminophen **OR** acetaminophen, bisacodyl, clonazePAM, hydrALAZINE, HYDROcodone-acetaminophen, ondansetron **OR** ondansetron (ZOFRAN) IV, sodium chloride, technetium TC 51M diethylenetriame-pentaacetic acid  Current Labs: reviewed    Physical Exam:  Blood pressure 135/52, pulse 64, temperature 97.1 F (36.2 C), temperature source Oral, resp. rate 19, height 6' (1.829 m), weight 125.3 kg (276 lb 3.8 oz), SpO2 96 %. GEN: NAD ENT: NCAT EYES: EOMI CV: RRR PULM: diminished in the bases ABD: s/nt/nd SKIN: no  rashes/leions EXT:LLE 3+ edema, RLE 1+ edema  A 1. Progressive CKD4T, ddKT 2009 [facility], now with CAN, recent ACR 2. Chronic IS on Tac/MMF/Pred; Fluc/Ganciclovir/TMP-SMX 3. Acute LLE DVT on Argatroban and transitioning to warfarin; VQ negative 4. 2HPTH on VDRA 5. Porcine Allergy 6. Anemia, normocytic 7. CHF 8. COPD 9. DM 10. Aortic Insufficiency hx/o Aortic IE 11/2014 11. Gout on febuxostat 12. Hyperkalemia, mild  P 1. No change to IS 2. Change to PO Furosdmide, home dose 3. Follow K for now   Pearson Grippe MD 10/09/2015, 9:37 AM   Recent Labs Lab 10/07/15 1145 10/08/15 0558 10/09/15 0710  NA 145 144 140  K 5.3* 4.8 5.4*  CL 112* 110 106  CO2 21* 25 24  GLUCOSE 75 250* 370*  BUN 62* 67* 80*  CREATININE 3.52* 3.87* 4.39*  CALCIUM 8.3* 7.8* 7.6*  PHOS  --   --  6.5*    Recent Labs Lab 10/07/15 1145 10/08/15 0558 10/09/15 0710  WBC 5.2 4.1 3.6*  HGB 10.4* 9.0* 9.4*  HCT 35.1* 29.8* 32.9*  MCV 91.2 92.3 92.7  PLT 255 217 242

## 2015-10-09 NOTE — Progress Notes (Signed)
Pt c/o chest pain to right chest, 6/10, does not radiate. Pt unable to describe pain. K Schorr notified and EKG performed, lab to draw troponin. Pt pain relieved with Vicodin.

## 2015-10-09 NOTE — Progress Notes (Signed)
PROGRESS NOTE  [name] [name] [id number] DOB: 01/19/1966 DOA: 10/07/2015 PCP: [name] Koch, MD  HPI [name] [name] is a 49 y.o. male with a Past Medical History of CKD s/p transplant, HTN, DM, CHF, HLD, AI, COPD, GETD, who presents with DVT  Subjective: - Patient states shortness of breath has improved since admission. Patient complaining of some right-sided chest discomfort.  Assessment/Plan: Acute DVT left popliteal vein Questionable etiology. Patient with an allergy to pork derived products and as such patient has been started on argatroban. VQ scan negative for PE.  - coumadin today per pharmacy. Will need full bridge  Shortness of breath/pulmonary edema/acute on chronic diastolic CHF exacerbation Likely secondary to progressive chronic kidney disease. Patient with bibasilar crackles. Chest x-ray on admission consistent with vascular congestion.  - diuresis with Lasix - nephrology consulted.   Chronic kidney disease stage 4/5/history of renal transplant 2009 Patient with creatinine currently at 3.87. Patient with recent fistula placement in right upper extremity. Patient with volume overload noted on examination and per chest x-ray. Change oral Lasix to IV Lasix 80 mg every 8 hours. Continue Prograf and prednisone and CellCept and found ganciclovir and Bactrim. Nephrology consultation.  Diabetes mellitus type 2 Hemoglobin A1c was 6.5 on 08/26/2015. Place patient on Lantus 25 units daily. NovoLog 5 units 3 times daily with meals. Sliding scale insulin. Outpatient follow-up with endocrinology  Hypertension Stable. Continue clonidine, on IV Lasix. Add Norvasc. Follow.  Hypokalemia Resolved.  History of enterococcus bacteremia/aortic valve endocarditis with severe aortic insufficiency at [facility] earlier this year Surgery was recommended but patient declined. Repeat 2-D echo July revealed only mild to moderate aortic insufficiency.  Obstructive sleep  apnea  Anxiety Continue home regimen of anxiolytics  Code Status: Full Family Communication: Updated patient. No family at bedside. Disposition Plan: Home when medically stable.   Consultants:  Nephrology  Procedures:  Lower extremity Dopplers 10/07/2015  VQ scan 10/08/2015  Chest x-ray 10/07/2015  Antibiotics:  Chronic Bactrim started prior to admission  Chronic valganciclovir started prior to admission  Objective: Filed Vitals:   10/09/15 0620 10/09/15 1200  BP: 135/52 136/51  Pulse: 64 66  Temp: 97.1 F (36.2 C) 97.5 F (36.4 C)  Resp: 19 18    Intake/Output Summary (Last 24 hours) at 10/09/15 1251 Last data filed at 10/09/15 [id number]  Gross per 24 hour  Intake   1200 ml  Output    250 ml  Net    950 ml   Filed Weights   10/07/15 1643 10/08/15 0600 10/09/15 0749  Weight: 121.655 kg (268 lb 3.2 oz) 128.8 kg (283 lb 15.2 oz) 125.3 kg (276 lb 3.8 oz)    Exam:  General:  NAD  Cardiovascular: RRR  Respiratory: Bibasilar crackles  Abdomen: Soft, nontender, nondistended, positive bowel sounds.  Musculoskeletal: No clubbing or cyanosis. 1+ left lower extremity edema. Graft in right upper extremity with good thrill.  Neuro: non focal   Data Reviewed: Basic Metabolic Panel:  Recent Labs Lab 10/07/15 1145 10/08/15 0558 10/09/15 0710  NA 145 144 140  K 5.3* 4.8 5.4*  CL 112* 110 106  CO2 21* 25 24  GLUCOSE 75 250* 370*  BUN 62* 67* 80*  CREATININE 3.52* 3.87* 4.39*  CALCIUM 8.3* 7.8* 7.6*  PHOS  --   --  6.5*   Liver Function Tests:  Recent Labs Lab 10/09/15 0710  ALBUMIN 2.8*   CBC:  Recent Labs Lab 10/07/15 1145 10/08/15 0558 10/09/15 0710  WBC 5.2 4.1 3.6*  HGB 10.4* 9.0* 9.4*  HCT 35.1* 29.8* 32.9*  MCV 91.2 92.3 92.7  PLT 255 217 242   Cardiac Enzymes:  Recent Labs Lab 10/07/15 1145 10/08/15 2301  TROPONINI 0.06* 0.17*   BNP (last 3 results)  Recent Labs  10/07/15 1145  BNP 1430.7*    ProBNP (last 3  results) No results for input(s): PROBNP in the last 8760 hours.  CBG:  Recent Labs Lab 10/08/15 1101 10/08/15 1516 10/08/15 2053 10/09/15 0609 10/09/15 1157  GLUCAP 194* 178* 305* 331* 162*    No results found for this or any previous visit (from the past 240 hour(s)).   Studies: Nm Pulmonary Perf And Vent  10/08/2015  CLINICAL DATA:  49 year old male with shortness of breath and known DVT. EXAM: NUCLEAR MEDICINE VENTILATION - PERFUSION LUNG SCAN TECHNIQUE: Ventilation images were obtained in multiple projections using inhaled aerosol Tc-46m DTPA. Perfusion images were obtained in multiple projections after intravenous injection of Tc-59m MAA. RADIOPHARMACEUTICALS:  0000000 millicuries AB-[id number] DTPA aerosol inhalation and 4.1 millicurie AB-[id number] MAA IV COMPARISON:  10/07/2015 chest radiograph FINDINGS: Ventilation: No focal ventilation defect. Perfusion: No wedge shaped peripheral perfusion defects to suggest acute pulmonary embolism. IMPRESSION: Unremarkable exam.  No evidence of pulmonary emboli. Electronically Signed   By: Margarette Canada M.D.   On: 10/08/2015 15:00    Scheduled Meds: . amLODipine  5 mg Oral Daily  . aspirin EC  81 mg Oral Daily  . calcitRIOL  1.5 mcg Oral Daily  . cloNIDine  0.3 mg Oral TID  . diazepam  5 mg Oral BID  . febuxostat  80 mg Oral Daily  . fluconazole  50 mg Oral Q M,W,F  . furosemide  80 mg Oral BID  . insulin aspart  0-15 Units Subcutaneous TID WC  . insulin aspart  5 Units Subcutaneous TID WC  . insulin glargine  25 Units Subcutaneous QHS  . mycophenolate  750 mg Oral BID  . pantoprazole  40 mg Oral Daily  . predniSONE  5 mg Oral Q breakfast  . pregabalin  100 mg Oral QHS  . rosuvastatin  20 mg Oral QHS  . sodium chloride  3 mL Intravenous Q12H  . sulfamethoxazole-trimethoprim  1 tablet Oral Once per day on Mon Wed Fri  . tacrolimus  2.5 mg Oral BID  . valGANciclovir  450 mg Oral Once per day on Mon Wed Fri  . warfarin  4 mg Oral  ONCE-1800  . Warfarin - Pharmacist Dosing Inpatient   Does not apply q1800   Continuous Infusions: . argatroban 1.2 mcg/kg/min (10/09/15 0456)    Principal Problem:   Deep vein thrombosis (DVT) of left lower extremity (HCC) Active Problems:   Obstructive sleep apnea   KIDNEY TRANSPLANTATION, HX OF   Benign essential HTN   H/O kidney transplant   Diabetes mellitus, type 2 (HCC)   Diabetic peripheral neuropathy (HCC)   Chronic kidney disease (CKD), stage IV (severe) (HCC)   CHF (congestive heart failure) (HCC)   Hyperkalemia   Prolonged Q-T interval on ECG   Chronic deep vein thrombosis (DVT) of left lower extremity ([place])   Acute on chronic congestive heart failure ([place])   Acute pulmonary edema (HCC)   Marzetta Board MD [facility] Pager [phone]. If 7PM-7AM, please contact night-coverage at [email or web address], password [facility] 10/09/2015, 12:51 PM  LOS: 2 days

## 2015-10-09 NOTE — Progress Notes (Addendum)
[place] for Argatroban --> Warfarin  Indication: DVT             Patient Measurements: Height: 6' (182.9 cm) Weight: 276 lb 3.8 oz (125.3 kg) (scale A) IBW/kg (Calculated) : 77.6  Vital Signs: Temp: 97.1 F (36.2 C) (12/28 0620) Temp Source: Oral (12/28 0620) BP: 135/52 mmHg (12/28 0620) Pulse Rate: 64 (12/28 0620)  Labs:  Recent Labs  10/07/15 1145  10/08/15 0054 10/08/15 0558 10/08/15 2301 10/09/15 0710  HGB 10.4*  --   --  9.0*  --  9.4*  HCT 35.1*  --   --  29.8*  --  32.9*  PLT 255  --   --  217  --  242  APTT  --   < > 64* 69*  --  62*  LABPROT  --   --   --   --   --  26.5*  INR  --   --   --   --   --  2.48*  CREATININE 3.52*  --   --  3.87*  --  4.39*  TROPONINI 0.06*  --   --   --  0.17*  --   < > = values in this interval not displayed.  Estimated Creatinine Clearance: 27.2 mL/min (by C-G formula based on Cr of 4.39).  Assessment: 49 yo Male with DVT of LLE on argatroban due to porcine allergy.  aptt therapeutic at 62 sec Hgb 9.4, Plts 242. No s/sx bleeding noted    Argatroban and warfarin should be administered with at least a 5 day overlap and an INR = 4 for 24 hours.  After 5 days, If INR =4, continue concomitant therapy. If INR >4, stop argatroban infusion and repeat INR 4-6 hours later (do not throw infusion away).   Baseline INR on argatroban 2.48.   12/27: VQ scan negative for PE.  Warfarin DDI with fluconazole, bactrim  Goal of Therapy:  aPTT 50-90 seconds Monitor platelets by anticoagulation protocol: Yes  Argatroban and warfarin should be administered with at least a 5 day overlap and an INR = 4 for 24 hours.   Plan:  Continue Argatroban at 1.2 mcg/kg/min  Warfarin 4 mg x 1 Monitor for s/sx bleeding Daily CBC, aPTT, INR  Bennye Alm, PharmD Pharmacy Resident [phone]

## 2015-10-09 NOTE — Progress Notes (Signed)
Inpatient Diabetes Program Recommendations  AACE/ADA: New Consensus Statement on Inpatient Glycemic Control (2015)  Target Ranges:  Prepandial:   less than 140 mg/dL      Peak postprandial:   less than 180 mg/dL (1-2 hours)      Critically ill patients:  140 - 180 mg/dL   Review of Glycemic Control:  Results for [name], COTTON (MRN [id number]) as of 10/09/2015 10:46  Ref. Range 10/08/2015 06:12 10/08/2015 11:01 10/08/2015 15:16 10/08/2015 20:53 10/09/2015 06:09  Glucose-Capillary Latest Ref Range: 65-99 mg/dL 215 (H) 194 (H) 178 (H) 305 (H) 331 (H)  Diabetes history: Type 2 diabetes Outpatient Diabetes medications: Toujeo 90 units bid, U500 10 units with breakfast, 25 units with lunch, and 20 units with dinner Current orders for Inpatient glycemic control:   Lantus 25 units q HS, Novolog moderate tid with meals, Novolog 5 units tid with meals  Inpatient Diabetes Program Recommendations:    Note CBG's now trending up.  Please consider increasing Lantus to 45 units bid (this is 1/2 of home dose).    Thanks, Adah Perl, RN, BC-ADM Inpatient Diabetes Coordinator Pager [phone] (8a-5p)

## 2015-10-10 LAB — CBC
HCT: 33.1 % — ABNORMAL LOW (ref 39.0–52.0)
Hemoglobin: 9.8 g/dL — ABNORMAL LOW (ref 13.0–17.0)
MCH: 27.5 pg (ref 26.0–34.0)
MCHC: 29.6 g/dL — ABNORMAL LOW (ref 30.0–36.0)
MCV: 93 fL (ref 78.0–100.0)
Platelets: 247 10*3/uL (ref 150–400)
RBC: 3.56 MIL/uL — ABNORMAL LOW (ref 4.22–5.81)
RDW: 15 % (ref 11.5–15.5)
WBC: 4.9 10*3/uL (ref 4.0–10.5)

## 2015-10-10 LAB — BASIC METABOLIC PANEL
Anion gap: 7 (ref 5–15)
BUN: 87 mg/dL — ABNORMAL HIGH (ref 6–20)
CO2: 25 mmol/L (ref 22–32)
Calcium: 7.4 mg/dL — ABNORMAL LOW (ref 8.9–10.3)
Chloride: 107 mmol/L (ref 101–111)
Creatinine, Ser: 4.57 mg/dL — ABNORMAL HIGH (ref 0.61–1.24)
GFR calc Af Amer: 16 mL/min — ABNORMAL LOW (ref >60)
GFR calc non Af Amer: 14 mL/min — ABNORMAL LOW (ref >60)
Glucose, Bld: 392 mg/dL — ABNORMAL HIGH (ref 65–99)
Potassium: 5.8 mmol/L — ABNORMAL HIGH (ref 3.5–5.1)
Sodium: 139 mmol/L (ref 135–145)

## 2015-10-10 LAB — GLUCOSE, CAPILLARY
Glucose-Capillary: 335 mg/dL — ABNORMAL HIGH (ref 65–99)
Glucose-Capillary: 259 mg/dL — ABNORMAL HIGH (ref 65–99)
Glucose-Capillary: 136 mg/dL — ABNORMAL HIGH (ref 65–99)
Glucose-Capillary: 196 mg/dL — ABNORMAL HIGH (ref 65–99)

## 2015-10-10 LAB — PROTIME-INR
INR: 2.86 — ABNORMAL HIGH (ref 0.00–1.49)
Prothrombin Time: 29.6 seconds — ABNORMAL HIGH (ref 11.6–15.2)

## 2015-10-10 LAB — TROPONIN I
Troponin I: 0.1 ng/mL — ABNORMAL HIGH (ref <0.031)
Troponin I: 0.09 ng/mL — ABNORMAL HIGH (ref <0.031)
Troponin I: 0.09 ng/mL — ABNORMAL HIGH (ref <0.031)

## 2015-10-10 LAB — APTT: aPTT: 68 seconds — ABNORMAL HIGH (ref 24–37)

## 2015-10-10 MED ORDER — WARFARIN SODIUM 5 MG PO TABS
5.0000 mg | ORAL_TABLET | Freq: Once | ORAL | Status: AC
  Administered 2015-10-10: 5 mg via ORAL
  Filled 2015-10-10: qty 1

## 2015-10-10 MED ORDER — SODIUM POLYSTYRENE SULFONATE 15 GM/60ML PO SUSP
15.0000 g | Freq: Once | ORAL | Status: AC
Start: 2015-10-10 — End: 2015-10-10
  Administered 2015-10-10: 15 g via ORAL
  Filled 2015-10-10: qty 60

## 2015-10-10 NOTE — Care Management Important Message (Signed)
Important Message  Patient Details  Name: [name] [name] MRN: [id number] Date of Birth: April 21, 1966   Medicare Important Message Given:  Yes    [name] 10/10/2015, 12:31 PM

## 2015-10-10 NOTE — Progress Notes (Signed)
Admit: 10/07/2015 LOS: 3  45M with CKD4T, progressive, admit with acute LLE DVT and concern for PE  Subjective:  Remains on argatroban, started warfarin On POfurosemide and TMP/SMX Feels well, breathing well INR <3  12/28 0701 - 12/29 0700 In: 1382.4 [P.O.:960; I.V.:422.4] Out: 325 [Urine:325]  Filed Weights   10/08/15 0600 10/09/15 0749 10/10/15 0445  Weight: 128.8 kg (283 lb 15.2 oz) 125.3 kg (276 lb 3.8 oz) 134.4 kg (296 lb 4.8 oz)    Scheduled Meds: . amLODipine  5 mg Oral Daily  . aspirin EC  81 mg Oral Daily  . calcitRIOL  1.5 mcg Oral Daily  . cloNIDine  0.3 mg Oral TID  . diazepam  5 mg Oral BID  . febuxostat  80 mg Oral Daily  . fluconazole  50 mg Oral Q M,W,F  . insulin aspart  0-15 Units Subcutaneous TID WC  . insulin aspart  5 Units Subcutaneous TID WC  . insulin glargine  25 Units Subcutaneous QHS  . mycophenolate  750 mg Oral BID  . pantoprazole  40 mg Oral Daily  . predniSONE  5 mg Oral Q breakfast  . pregabalin  100 mg Oral QHS  . rosuvastatin  20 mg Oral QHS  . sodium chloride  3 mL Intravenous Q12H  . sodium polystyrene  15 g Oral Once  . tacrolimus  2.5 mg Oral BID  . valGANciclovir  450 mg Oral Once per day on Mon Wed Fri  . warfarin  5 mg Oral ONCE-1800  . Warfarin - Pharmacist Dosing Inpatient   Does not apply q1800   Continuous Infusions: . argatroban 1.2 mcg/kg/min (10/10/15 0512)   PRN Meds:.sodium chloride, acetaminophen **OR** acetaminophen, bisacodyl, clonazePAM, hydrALAZINE, HYDROcodone-acetaminophen, ondansetron **OR** ondansetron (ZOFRAN) IV, sodium chloride, technetium TC 35M diethylenetriame-pentaacetic acid  Current Labs: reviewed    Physical Exam:  Blood pressure 161/54, pulse 75, temperature 97.5 F (36.4 C), temperature source Oral, resp. rate 18, height 6' (1.829 m), weight 134.4 kg (296 lb 4.8 oz), SpO2 93 %. GEN: NAD ENT: NCAT EYES: EOMI CV: RRR PULM: diminished in the bases ABD: s/nt/nd SKIN: no rashes/leions EXT:LLE  3+ edema, RLE 1+ edema RUE BVT AVF: +B/T  A 1. Progressive CKD4T, ddKT 2009 [facility], now with CAN, recent ACR (tx with IVIG and pulse steroids) 2. Chronic IS on Tac/MMF/Pred; Fluc/Ganciclovir/TMP-SMX 3. Acute LLE DVT on Argatroban and transitioning to warfarin; VQ negative 4. 2HPTH on VDRA 5. Porcine Allergy 6. Anemia, normocytic 7. CHF 8. COPD 9. DM 10. Aortic Insufficiency hx/o Aortic IE 11/2014 11. Gout on febuxostat 12. Hyperkalemia, mild -- 2/2 CNI + TMP/SMX  P 1. No change to IS 2. Hold lasix for 24h 3. Stop TMP/SMX given hyperkalemia and GFR decline 4. Follow K for now   Pearson Grippe MD 10/10/2015, 10:51 AM   Recent Labs Lab 10/08/15 0558 10/09/15 0710 10/10/15 0527  NA 144 140 139  K 4.8 5.4* 5.8*  CL 110 106 107  CO2 25 24 25   GLUCOSE 250* 370* 392*  BUN 67* 80* 87*  CREATININE 3.87* 4.39* 4.57*  CALCIUM 7.8* 7.6* 7.4*  PHOS  --  6.5*  --     Recent Labs Lab 10/08/15 0558 10/09/15 0710 10/10/15 0527  WBC 4.1 3.6* 4.9  HGB 9.0* 9.4* 9.8*  HCT 29.8* 32.9* 33.1*  MCV 92.3 92.7 93.0  PLT 217 242 247

## 2015-10-10 NOTE — Progress Notes (Signed)
[place] for Argatroban --> Warfarin  Indication: DVT             Patient Measurements: Height: 6' (182.9 cm) Weight: 296 lb 4.8 oz (134.4 kg) (scale a nurse notified) IBW/kg (Calculated) : 77.6  Vital Signs: Temp: 97.5 F (36.4 C) (12/29 0445) Temp Source: Oral (12/29 0445) BP: 161/54 mmHg (12/29 0445) Pulse Rate: 75 (12/29 0445)  Labs:  Recent Labs  10/08/15 0558 10/08/15 2301 10/09/15 0710 10/09/15 1310 10/10/15 0527  HGB 9.0*  --  9.4*  --  9.8*  HCT 29.8*  --  32.9*  --  33.1*  PLT 217  --  242  --  247  APTT 69*  --  62*  --  68*  LABPROT  --   --  26.5*  --  29.6*  INR  --   --  2.48*  --  2.86*  CREATININE 3.87*  --  4.39*  --  4.57*  TROPONINI  --  0.17*  --  0.15* 0.10*    Estimated Creatinine Clearance: 27.1 mL/min (by C-G formula based on Cr of 4.57).  Assessment: 49 yo Male with DVT left popliteal vein. VQ negative for PE.   Argatroban due to porcine allergy. APTT remains therapeutic at 68 sec on 1.45mcg/kg/min. INR prior to warfarin start 2.48. INR 2.68 after receiving 4mg  warfarin yesterday. Hgb and plt stable. No bleeding noted.  DDI: Fluconazole, Bactrim    Goal of Therapy:  aPTT 50-90 seconds Monitor platelets by anticoagulation protocol: Yes    Plan:  Continue Argatroban at 1.2 mcg/kg/min   Warfarin 5 mg x 1 Daily CBC, aPTT, INR Monitor for s/sx bleeding  Stephens November, PharmD Clinical Pharmacy Resident 10/10/2015 [phone]

## 2015-10-10 NOTE — Progress Notes (Signed)
Inpatient Diabetes Program Recommendations  AACE/ADA: New Consensus Statement on Inpatient Glycemic Control (2015)  Target Ranges:  Prepandial:   less than 140 mg/dL      Peak postprandial:   less than 180 mg/dL (1-2 hours)      Critically ill patients:  140 - 180 mg/dL  Results for BRIEN, WILKINSON (MRN [id number]) as of 10/10/2015 10:31  Ref. Range 10/09/2015 06:09 10/09/2015 11:57 10/09/2015 16:26 10/09/2015 21:06 10/10/2015 05:54  Glucose-Capillary Latest Ref Range: 65-99 mg/dL 331 (H) 162 (H) 198 (H) 283 (H) 335 (H)   Review of Glycemic Control  Diabetes history: Type 2 diabetes Outpatient Diabetes medications: Toujeo 90 units bid, U500 10 units with breakfast, 25 units with lunch, and 20 units with dinner Current orders for Inpatient glycemic control: Lantus 25 units QHS, Novolog 0-15 units TID with meals, Novolog 5 units TID with meals for meal coverage  Inpatient Diabetes Program Recommendations: Insulin - Basal: Please consider increasing Lantus to 40 units QHS (based on 134 kg x 0.3 units). Correction (SSI): Please consider ordering Novolog bedtime correction scale.  Thanks, Barnie Alderman, RN, MSN, CDE Diabetes Coordinator Inpatient Diabetes Program [phone] (Team Pager from [place] to [place]) [phone] ([place] office) [phone] [facility] office) [phone] [facility] office)

## 2015-10-10 NOTE — Progress Notes (Signed)
PROGRESS NOTE  [name] [name] [name] [name] DOB: Sep 17, 1966 DOA: 10/07/2015 PCP: Hoyt Koch, MD  HPI [name] [name] is a 49 y.o. male with a Past Medical History of CKD s/p transplant, HTN, DM, CHF, HLD, AI, COPD, GETD, who presents with DVT  Subjective: - no complaints this morning, asking whether he can leave the hospital for 3 hours on Monday or Tuesday for court appointment  Assessment/Plan: Acute DVT left popliteal vein Questionable etiology. Patient with an allergy to pork derived products and as such patient has been started on argatroban. VQ scan negative for PE.  - coumadin per pharmacy. Will need full bridge with argatroban. Today overlap day 2 / 5  Shortness of breath/pulmonary edema/acute on chronic diastolic CHF exacerbation Likely secondary to progressive chronic kidney disease. Patient with bibasilar crackles. Chest x-ray on admission consistent with vascular congestion.  - diuresis with Lasix, on hold today due to worsening renal failure  - nephrology consulted.   Chronic kidney disease stage 4/5 history of renal transplant 2009 Patient with creatinine currently at 3.87. Patient with recent fistula placement in right upper extremity. Patient with volume overload noted on examination and per chest x-ray. Continue home medications - hold Bactrim due to renal failure   Diabetes mellitus type 2 Hemoglobin A1c was 6.5 on 08/26/2015. Place patient on Lantus 25 units daily. NovoLog 5 units 3 times daily with meals. Sliding scale insulin. Outpatient follow-up with endocrinology  Hypertension Stable. Continue clonidine, on IV Lasix. Add Norvasc. Follow.  Hypokalemia Resolved.  History of enterococcus bacteremia/aortic valve endocarditis with severe aortic insufficiency at [facility] earlier this year Surgery was recommended but patient declined. Repeat 2-D echo July revealed only mild to moderate aortic insufficiency.  Obstructive sleep  apnea  Anxiety Continue home regimen of anxiolytics  Code Status: Full Family Communication: Updated patient. No family at bedside. Disposition Plan: Home when medically stable.   Consultants:  Nephrology  Procedures:  Lower extremity Dopplers 10/07/2015  VQ scan 10/08/2015  Chest x-ray 10/07/2015  Antibiotics:  Chronic Bactrim started prior to admission  Chronic valganciclovir started prior to admission  Objective: Filed Vitals:   10/09/15 2107 10/10/15 0445  BP: 149/50 161/54  Pulse: 64 75  Temp: 97.6 F (36.4 C) 97.5 F (36.4 C)  Resp: 20 18    Intake/Output Summary (Last 24 hours) at 10/10/15 1208 Last data filed at 10/10/15 0600  Gross per 24 hour  Intake 1142.4 ml  Output    325 ml  Net  817.4 ml   Filed Weights   10/08/15 0600 10/09/15 0749 10/10/15 0445  Weight: 128.8 kg (283 lb 15.2 oz) 125.3 kg (276 lb 3.8 oz) 134.4 kg (296 lb 4.8 oz)    Exam:  General:  NAD  Cardiovascular: RRR  Respiratory: Bibasilar crackles  Abdomen: Soft, nontender, nondistended, positive bowel sounds.  Musculoskeletal: No clubbing or cyanosis. trace lower extremity edema. Graft in right upper extremity with good thrill.  Neuro: non focal   Data Reviewed: Basic Metabolic Panel:  Recent Labs Lab 10/07/15 1145 10/08/15 0558 10/09/15 0710 10/10/15 0527  NA 145 144 140 139  K 5.3* 4.8 5.4* 5.8*  CL 112* 110 106 107  CO2 21* 25 24 25   GLUCOSE 75 250* 370* 392*  BUN 62* 67* 80* 87*  CREATININE 3.52* 3.87* 4.39* 4.57*  CALCIUM 8.3* 7.8* 7.6* 7.4*  PHOS  --   --  6.5*  --    Liver Function Tests:  Recent Labs Lab 10/09/15 0710  ALBUMIN 2.8*  CBC:  Recent Labs Lab 10/07/15 1145 10/08/15 0558 10/09/15 0710 10/10/15 0527  WBC 5.2 4.1 3.6* 4.9  HGB 10.4* 9.0* 9.4* 9.8*  HCT 35.1* 29.8* 32.9* 33.1*  MCV 91.2 92.3 92.7 93.0  PLT 255 217 242 247   Cardiac Enzymes:  Recent Labs Lab 10/07/15 1145 10/08/15 2301 10/09/15 1310 10/10/15 0527   TROPONINI 0.06* 0.17* 0.15* 0.10*   BNP (last 3 results)  Recent Labs  10/07/15 1145  BNP 1430.7*   CBG:  Recent Labs Lab 10/09/15 1157 10/09/15 1626 10/09/15 2106 10/10/15 0554 10/10/15 1130  GLUCAP 162* 198* 283* 335* 259*    No results found for this or any previous visit (from the past 240 hour(s)).   Studies: Nm Pulmonary Perf And Vent  10/08/2015  CLINICAL DATA:  49 year old male with shortness of breath and known DVT. EXAM: NUCLEAR MEDICINE VENTILATION - PERFUSION LUNG SCAN TECHNIQUE: Ventilation images were obtained in multiple projections using inhaled aerosol Tc-57m DTPA. Perfusion images were obtained in multiple projections after intravenous injection of Tc-39m MAA. RADIOPHARMACEUTICALS:  0000000 millicuries AB-[id number] DTPA aerosol inhalation and 4.1 millicurie AB-[id number] MAA IV COMPARISON:  10/07/2015 chest radiograph FINDINGS: Ventilation: No focal ventilation defect. Perfusion: No wedge shaped peripheral perfusion defects to suggest acute pulmonary embolism. IMPRESSION: Unremarkable exam.  No evidence of pulmonary emboli. Electronically Signed   By: Margarette Canada M.D.   On: 10/08/2015 15:00    Scheduled Meds: . amLODipine  5 mg Oral Daily  . aspirin EC  81 mg Oral Daily  . calcitRIOL  1.5 mcg Oral Daily  . cloNIDine  0.3 mg Oral TID  . diazepam  5 mg Oral BID  . febuxostat  80 mg Oral Daily  . fluconazole  50 mg Oral Q M,W,F  . insulin aspart  0-15 Units Subcutaneous TID WC  . insulin aspart  5 Units Subcutaneous TID WC  . insulin glargine  25 Units Subcutaneous QHS  . mycophenolate  750 mg Oral BID  . pantoprazole  40 mg Oral Daily  . predniSONE  5 mg Oral Q breakfast  . pregabalin  100 mg Oral QHS  . rosuvastatin  20 mg Oral QHS  . sodium chloride  3 mL Intravenous Q12H  . tacrolimus  2.5 mg Oral BID  . valGANciclovir  450 mg Oral Once per day on Mon Wed Fri  . warfarin  5 mg Oral ONCE-1800  . Warfarin - Pharmacist Dosing Inpatient   Does not  apply q1800   Continuous Infusions: . argatroban 1.2 mcg/kg/min (10/10/15 1126)    Principal Problem:   Deep vein thrombosis (DVT) of left lower extremity (HCC) Active Problems:   Obstructive sleep apnea   KIDNEY TRANSPLANTATION, HX OF   Benign essential HTN   H/O kidney transplant   Diabetes mellitus, type 2 (HCC)   Diabetic peripheral neuropathy (HCC)   Chronic kidney disease (CKD), stage IV (severe) (HCC)   CHF (congestive heart failure) (HCC)   Hyperkalemia   Prolonged Q-T interval on ECG   Chronic deep vein thrombosis (DVT) of left lower extremity ([place])   Acute on chronic congestive heart failure ([place])   Acute pulmonary edema (HCC)   Marzetta Board MD [facility] Pager [phone]. If 7PM-7AM, please contact night-coverage at [email or web address], password [facility] 10/10/2015, 12:08 PM  LOS: 3 days

## 2015-10-11 LAB — GLUCOSE, CAPILLARY
Glucose-Capillary: 303 mg/dL — ABNORMAL HIGH (ref 65–99)
Glucose-Capillary: 225 mg/dL — ABNORMAL HIGH (ref 65–99)
Glucose-Capillary: 206 mg/dL — ABNORMAL HIGH (ref 65–99)
Glucose-Capillary: 312 mg/dL — ABNORMAL HIGH (ref 65–99)

## 2015-10-11 LAB — CBC
HCT: 31.2 % — ABNORMAL LOW (ref 39.0–52.0)
Hemoglobin: 9.3 g/dL — ABNORMAL LOW (ref 13.0–17.0)
MCH: 27.8 pg (ref 26.0–34.0)
MCHC: 29.8 g/dL — ABNORMAL LOW (ref 30.0–36.0)
MCV: 93.1 fL (ref 78.0–100.0)
Platelets: 251 10*3/uL (ref 150–400)
RBC: 3.35 MIL/uL — ABNORMAL LOW (ref 4.22–5.81)
RDW: 15.4 % (ref 11.5–15.5)
WBC: 5.1 10*3/uL (ref 4.0–10.5)

## 2015-10-11 LAB — BASIC METABOLIC PANEL
Anion gap: 12 (ref 5–15)
BUN: 94 mg/dL — ABNORMAL HIGH (ref 6–20)
CO2: 22 mmol/L (ref 22–32)
Calcium: 6.9 mg/dL — ABNORMAL LOW (ref 8.9–10.3)
Chloride: 106 mmol/L (ref 101–111)
Creatinine, Ser: 4.78 mg/dL — ABNORMAL HIGH (ref 0.61–1.24)
GFR calc Af Amer: 15 mL/min — ABNORMAL LOW (ref >60)
GFR calc non Af Amer: 13 mL/min — ABNORMAL LOW (ref >60)
Glucose, Bld: 333 mg/dL — ABNORMAL HIGH (ref 65–99)
Potassium: 5.3 mmol/L — ABNORMAL HIGH (ref 3.5–5.1)
Sodium: 140 mmol/L (ref 135–145)

## 2015-10-11 LAB — PROTIME-INR
INR: 2.94 — ABNORMAL HIGH (ref 0.00–1.49)
Prothrombin Time: 30.1 seconds — ABNORMAL HIGH (ref 11.6–15.2)

## 2015-10-11 LAB — APTT: aPTT: 71 seconds — ABNORMAL HIGH (ref 24–37)

## 2015-10-11 MED ORDER — WARFARIN SODIUM 7.5 MG PO TABS
7.5000 mg | ORAL_TABLET | Freq: Once | ORAL | Status: AC
  Administered 2015-10-11: 7.5 mg via ORAL
  Filled 2015-10-11: qty 1

## 2015-10-11 NOTE — Progress Notes (Signed)
PROGRESS NOTE  [name] [name] [id number] DOB: [birth year] DOA: 10/07/2015 PCP: Hoyt Koch, MD  HPI [name] [name] is a 49 y.o. male with a Past Medical History of CKD s/p transplant, HTN, DM, CHF, HLD, AI, COPD, GETD, who presents with DVT  Subjective: - He is sleeping comfortably this morning, oxygen is off - Wakes up easily, alert and appropriate, very talkative, has no specific complaints he feels well and waiting to go home once his INR is therapeutic  Assessment/Plan: Acute DVT left popliteal vein Questionable etiology. Patient with an allergy to pork derived products and as such patient has been started on argatroban. VQ scan negative for PE.  - coumadin per pharmacy. Will need full bridge with argatroban. Today overlap day 3 / 5  Shortness of breath/pulmonary edema/acute on chronic diastolic CHF exacerbation Likely secondary to progressive chronic kidney disease. Patient with bibasilar crackles. Chest x-ray on admission consistent with vascular congestion.  - diuresis with Lasix, on hold still due to renal failure - Creatinine with slight worsening today  Chronic kidney disease stage 4/5 history of renal transplant 2009 Patient with creatinine currently at 3.87. Patient with recent fistula placement in right upper extremity. Patient with volume overload noted on examination and per chest x-ray. Continue home medications - hold Bactrim due to renal failure   Diabetes mellitus type 2 Hemoglobin A1c was 6.5 on 08/26/2015. Place patient on Lantus 25 units daily. NovoLog 5 units 3 times daily with meals. Sliding scale insulin. Outpatient follow-up with endocrinology  Hypertension Stable. Continue clonidine, on IV Lasix. Add Norvasc. Follow.  Hypokalemia Resolved.  History of enterococcus bacteremia/aortic valve endocarditis with severe aortic insufficiency at [facility] earlier this year Surgery was recommended but patient declined. Repeat 2-D echo July revealed  only mild to moderate aortic insufficiency.  Obstructive sleep apnea  Anxiety Continue home regimen of anxiolytics  Code Status: Full Family Communication: Updated patient. No family at bedside. Disposition Plan: Home when medically stable.   Consultants:  Nephrology  Procedures:  Lower extremity Dopplers 10/07/2015  VQ scan 10/08/2015  Chest x-ray 10/07/2015  Antibiotics:  Chronic Bactrim started prior to admission  Chronic valganciclovir started prior to admission  Objective: Filed Vitals:   10/11/15 0413 10/11/15 0922  BP: 160/58 149/46  Pulse: 77   Temp: 97.8 F (36.6 C)   Resp: 18     Intake/Output Summary (Last 24 hours) at 10/11/15 1237 Last data filed at 10/11/15 0903  Gross per 24 hour  Intake 963.38 ml  Output    550 ml  Net 413.38 ml   Filed Weights   10/09/15 0749 10/10/15 0445 10/11/15 0413  Weight: 125.3 kg (276 lb 3.8 oz) 134.4 kg (296 lb 4.8 oz) 129.457 kg (285 lb 6.4 oz)    Exam:  General:  NAD  Cardiovascular: RRR  Respiratory: Bibasilar crackles  Abdomen: Soft, nontender, nondistended, positive bowel sounds.  Musculoskeletal: No clubbing or cyanosis. trace lower extremity edema. Graft in right upper extremity with good thrill.  Neuro: non focal   Data Reviewed: Basic Metabolic Panel:  Recent Labs Lab 10/07/15 1145 10/08/15 0558 10/09/15 0710 10/10/15 0527 10/11/15 1030  NA 145 144 140 139 140  K 5.3* 4.8 5.4* 5.8* 5.3*  CL 112* 110 106 107 106  CO2 21* 25 24 25 22   GLUCOSE 75 250* 370* 392* 333*  BUN 62* 67* 80* 87* 94*  CREATININE 3.52* 3.87* 4.39* 4.57* 4.78*  CALCIUM 8.3* 7.8* 7.6* 7.4* 6.9*  PHOS  --   --  6.5*  --   --    Liver Function Tests:  Recent Labs Lab 10/09/15 0710  ALBUMIN 2.8*   CBC:  Recent Labs Lab 10/07/15 1145 10/08/15 0558 10/09/15 0710 10/10/15 0527 10/11/15 0551  WBC 5.2 4.1 3.6* 4.9 5.1  HGB 10.4* 9.0* 9.4* 9.8* 9.3*  HCT 35.1* 29.8* 32.9* 33.1* 31.2*  MCV 91.2 92.3 92.7  93.0 93.1  PLT 255 217 242 247 251   Cardiac Enzymes:  Recent Labs Lab 10/08/15 2301 10/09/15 1310 10/10/15 0527 10/10/15 1130 10/10/15 1740  TROPONINI 0.17* 0.15* 0.10* 0.09* 0.09*   BNP (last 3 results)  Recent Labs  10/07/15 1145  BNP 1430.7*   CBG:  Recent Labs Lab 10/10/15 0554 10/10/15 1130 10/10/15 1620 10/10/15 2025 10/11/15 0538  GLUCAP 335* 259* 136* 196* 303*    No results found for this or any previous visit (from the past 240 hour(s)).   Studies: No results found.  Scheduled Meds: . amLODipine  5 mg Oral Daily  . aspirin EC  81 mg Oral Daily  . calcitRIOL  1.5 mcg Oral Daily  . cloNIDine  0.3 mg Oral TID  . diazepam  5 mg Oral BID  . febuxostat  80 mg Oral Daily  . fluconazole  50 mg Oral Q M,W,F  . insulin aspart  0-15 Units Subcutaneous TID WC  . insulin aspart  5 Units Subcutaneous TID WC  . insulin glargine  25 Units Subcutaneous QHS  . mycophenolate  750 mg Oral BID  . pantoprazole  40 mg Oral Daily  . predniSONE  5 mg Oral Q breakfast  . pregabalin  100 mg Oral QHS  . rosuvastatin  20 mg Oral QHS  . sodium chloride  3 mL Intravenous Q12H  . tacrolimus  2.5 mg Oral BID  . valGANciclovir  450 mg Oral Once per day on Mon Wed Fri  . warfarin  7.5 mg Oral ONCE-1800  . Warfarin - Pharmacist Dosing Inpatient   Does not apply q1800   Continuous Infusions: . argatroban 1.2 mcg/kg/min (10/11/15 0853)    Principal Problem:   Deep vein thrombosis (DVT) of left lower extremity (HCC) Active Problems:   Obstructive sleep apnea   KIDNEY TRANSPLANTATION, HX OF   Benign essential HTN   H/O kidney transplant   Diabetes mellitus, type 2 (HCC)   Diabetic peripheral neuropathy (HCC)   Chronic kidney disease (CKD), stage IV (severe) (HCC)   CHF (congestive heart failure) (HCC)   Hyperkalemia   Prolonged Q-T interval on ECG   Chronic deep vein thrombosis (DVT) of left lower extremity ([place])   Acute on chronic congestive heart failure ([place])    Acute pulmonary edema (HCC)   Marzetta Board MD [facility] Pager [phone]. If 7PM-7AM, please contact night-coverage at [email or web address], password [facility] 10/11/2015, 12:37 PM  LOS: 4 days

## 2015-10-11 NOTE — Progress Notes (Signed)
[place] for Argatroban --> Warfarin  Indication: DVT             Patient Measurements: Height: 6' (182.9 cm) Weight: 285 lb 6.4 oz (129.457 kg) (scale a) IBW/kg (Calculated) : 77.6  Vital Signs: Temp: 97.8 F (36.6 C) (12/30 0413) Temp Source: Oral (12/30 0413) BP: 160/58 mmHg (12/30 0413) Pulse Rate: 77 (12/30 0413)  Labs:  Recent Labs  10/09/15 0710  10/10/15 0527 10/10/15 1130 10/10/15 1740 10/11/15 0551  HGB 9.4*  --  9.8*  --   --  9.3*  HCT 32.9*  --  33.1*  --   --  31.2*  PLT 242  --  247  --   --  251  APTT 62*  --  68*  --   --  71*  LABPROT 26.5*  --  29.6*  --   --  30.1*  INR 2.48*  --  2.86*  --   --  2.94*  CREATININE 4.39*  --  4.57*  --   --   --   TROPONINI  --   < > 0.10* 0.09* 0.09*  --   < > = values in this interval not displayed.  Estimated Creatinine Clearance: 26.6 mL/min (by C-G formula based on Cr of 4.57).  Assessment: DVT left popliteal vein. VQ negative for PE.   Argatroban due to porcine allergy. APTT remains therapeutic at 71 sec on 1.48mcg/kg/min. INR prior to warfarin start 2.48. INR 2.68>2.94 after receiving 5mg  warfarin yesterday. Hgb and plt stable. No bleeding noted. Day 2/5 overlap.  DDI: Fluconazole   Argatroban and warfarin should be administered with at least 5 day overlap and an INR = 4 for 24 hours. After 5 days, If INR <4, continue concomitant therapy. If INR >4, stop argatroban infusion and repeat INR 4-6 hours later (do not discard bag)      Goal of Therapy:  INR >4 while on concomitant argatroban aPTT 50-90 seconds Monitor platelets by anticoagulation protocol: Yes    Plan:  Continue Argatroban at 1.2 mcg/kg/min   Warfarin 7.5 mg x 1 Daily CBC, aPTT, INR Monitor for s/sx bleeding  Stephens November, PharmD Clinical Pharmacy Resident 10/11/2015 [phone]

## 2015-10-11 NOTE — Progress Notes (Signed)
Admit: 10/07/2015 LOS: 4  31M with CKD4T, progressive, admit with acute LLE DVT and concern for PE  Subjective:  Remains on argatroban, cont on warfarin Diuretics on hold Pt w/o complaints INR <3  12/29 0701 - 12/30 0700 In: 551.2 [P.O.:340; I.V.:211.2] Out: 550 [Urine:550]  Filed Weights   10/09/15 0749 10/10/15 0445 10/11/15 0413  Weight: 125.3 kg (276 lb 3.8 oz) 134.4 kg (296 lb 4.8 oz) 129.457 kg (285 lb 6.4 oz)    Scheduled Meds: . amLODipine  5 mg Oral Daily  . aspirin EC  81 mg Oral Daily  . calcitRIOL  1.5 mcg Oral Daily  . cloNIDine  0.3 mg Oral TID  . diazepam  5 mg Oral BID  . febuxostat  80 mg Oral Daily  . fluconazole  50 mg Oral Q M,W,F  . insulin aspart  0-15 Units Subcutaneous TID WC  . insulin aspart  5 Units Subcutaneous TID WC  . insulin glargine  25 Units Subcutaneous QHS  . mycophenolate  750 mg Oral BID  . pantoprazole  40 mg Oral Daily  . predniSONE  5 mg Oral Q breakfast  . pregabalin  100 mg Oral QHS  . rosuvastatin  20 mg Oral QHS  . sodium chloride  3 mL Intravenous Q12H  . tacrolimus  2.5 mg Oral BID  . valGANciclovir  450 mg Oral Once per day on Mon Wed Fri  . warfarin  7.5 mg Oral ONCE-1800  . Warfarin - Pharmacist Dosing Inpatient   Does not apply q1800   Continuous Infusions: . argatroban 1.2 mcg/kg/min (10/11/15 0853)   PRN Meds:.sodium chloride, acetaminophen **OR** acetaminophen, bisacodyl, clonazePAM, hydrALAZINE, HYDROcodone-acetaminophen, ondansetron **OR** ondansetron (ZOFRAN) IV, sodium chloride, technetium TC 8M diethylenetriame-pentaacetic acid  Current Labs: reviewed    Physical Exam:  Blood pressure 160/58, pulse 77, temperature 97.8 F (36.6 C), temperature source Oral, resp. rate 18, height 6' (1.829 m), weight 129.457 kg (285 lb 6.4 oz), SpO2 98 %. GEN: NAD ENT: NCAT EYES: EOMI CV: RRR PULM: diminished in the bases ABD: s/nt/nd SKIN: no rashes/leions EXT:LLE 3+ edema, RLE no edema RUE BVT AVF:  +B/T  A 1. Progressive CKD4T, ddKT 2009 [facility], now with CAN, recent ACR (tx with IVIG and pulse steroids) 2. Chronic IS on Tac/MMF/Pred; Fluc/Ganciclovir 3. Acute LLE DVT on Argatroban and transitioning to warfarin; VQ negative 4. 2HPTH on VDRA 5. Porcine Allergy 6. Anemia, normocytic 7. CHF 8. COPD 9. DM 10. Aortic Insufficiency hx/o Aortic IE 11/2014 11. Gout on febuxostat 12. Hyperkalemia, mild -- 2/2 CNI + TMP/SMX 13. S/p R BVT 09/27/2015  P 1. No change to IS 2. Cont to hold lasix and TMP/SMX 3. Await AM labs  Pearson Grippe MD 10/11/2015, 9:19 AM   Recent Labs Lab 10/08/15 0558 10/09/15 0710 10/10/15 0527  NA 144 140 139  K 4.8 5.4* 5.8*  CL 110 106 107  CO2 25 24 25   GLUCOSE 250* 370* 392*  BUN 67* 80* 87*  CREATININE 3.87* 4.39* 4.57*  CALCIUM 7.8* 7.6* 7.4*  PHOS  --  6.5*  --     Recent Labs Lab 10/09/15 0710 10/10/15 0527 10/11/15 0551  WBC 3.6* 4.9 5.1  HGB 9.4* 9.8* 9.3*  HCT 32.9* 33.1* 31.2*  MCV 92.7 93.0 93.1  PLT 242 247 251

## 2015-10-11 NOTE — Progress Notes (Signed)
Inpatient Diabetes Program Recommendations  AACE/ADA: New Consensus Statement on Inpatient Glycemic Control (2015)  Target Ranges:  Prepandial:   less than 140 mg/dL      Peak postprandial:   less than 180 mg/dL (1-2 hours)      Critically ill patients:  140 - 180 mg/dL  Results for JERIT, TACKER (MRN [id number]) as of 10/11/2015 08:47  Ref. Range 10/10/2015 05:54 10/10/2015 11:30 10/10/2015 16:20 10/10/2015 20:25 10/11/2015 05:38  Glucose-Capillary Latest Ref Range: 65-99 mg/dL 335 (H) 259 (H) 136 (H) 196 (H) 303 (H)   Review of Glycemic Control  Diabetes history: Type 2 diabetes Outpatient Diabetes medications: Toujeo 90 units bid, U500 10 units with breakfast, 25 units with lunch, and 20 units with dinner Current orders for Inpatient glycemic control: Lantus 25 units QHS, Novolog 0-15 units TID with meals, Novolog 5 units TID with meals for meal coverage  Inpatient Diabetes Program Recommendations: Insulin - Basal: Please consider increasing Lantus to 40 units QHS (based on 134 kg x 0.3 units). Correction (SSI): Please consider ordering Novolog bedtime correction scale.  Thanks, Barnie Alderman, RN, MSN, CDE Diabetes Coordinator Inpatient Diabetes Program [phone] (Team Pager from [place] to [place]) [phone] ([facility] office) [phone] [facility] office) [phone] [facility] office)

## 2015-10-12 LAB — BASIC METABOLIC PANEL
Anion gap: 14 (ref 5–15)
BUN: 100 mg/dL — ABNORMAL HIGH (ref 6–20)
CO2: 21 mmol/L — ABNORMAL LOW (ref 22–32)
Calcium: 6.9 mg/dL — ABNORMAL LOW (ref 8.9–10.3)
Chloride: 107 mmol/L (ref 101–111)
Creatinine, Ser: 4.74 mg/dL — ABNORMAL HIGH (ref 0.61–1.24)
GFR calc Af Amer: 15 mL/min — ABNORMAL LOW (ref >60)
GFR calc non Af Amer: 13 mL/min — ABNORMAL LOW (ref >60)
Glucose, Bld: 358 mg/dL — ABNORMAL HIGH (ref 65–99)
Potassium: 5.7 mmol/L — ABNORMAL HIGH (ref 3.5–5.1)
Sodium: 142 mmol/L (ref 135–145)

## 2015-10-12 LAB — CBC
HCT: 32.5 % — ABNORMAL LOW (ref 39.0–52.0)
Hemoglobin: 9.6 g/dL — ABNORMAL LOW (ref 13.0–17.0)
MCH: 27.4 pg (ref 26.0–34.0)
MCHC: 29.5 g/dL — ABNORMAL LOW (ref 30.0–36.0)
MCV: 92.6 fL (ref 78.0–100.0)
Platelets: 239 10*3/uL (ref 150–400)
RBC: 3.51 MIL/uL — ABNORMAL LOW (ref 4.22–5.81)
RDW: 15.4 % (ref 11.5–15.5)
WBC: 5.9 10*3/uL (ref 4.0–10.5)

## 2015-10-12 LAB — APTT: aPTT: 78 seconds — ABNORMAL HIGH (ref 24–37)

## 2015-10-12 LAB — GLUCOSE, CAPILLARY
Glucose-Capillary: 334 mg/dL — ABNORMAL HIGH (ref 65–99)
Glucose-Capillary: 239 mg/dL — ABNORMAL HIGH (ref 65–99)
Glucose-Capillary: 294 mg/dL — ABNORMAL HIGH (ref 65–99)
Glucose-Capillary: 384 mg/dL — ABNORMAL HIGH (ref 65–99)

## 2015-10-12 LAB — PROTIME-INR
INR: 3.93 — ABNORMAL HIGH (ref 0.00–1.49)
Prothrombin Time: 37.5 seconds — ABNORMAL HIGH (ref 11.6–15.2)

## 2015-10-12 MED ORDER — POLYETHYLENE GLYCOL 3350 17 G PO PACK
17.0000 g | PACK | Freq: Two times a day (BID) | ORAL | Status: DC
  Administered 2015-10-12 – 2015-10-24 (×7): 17 g via ORAL
  Filled 2015-10-12 (×25): qty 1

## 2015-10-12 MED ORDER — INSULIN GLARGINE 100 UNIT/ML ~~LOC~~ SOLN
30.0000 [IU] | Freq: Every day | SUBCUTANEOUS | Status: DC
  Administered 2015-10-12: 30 [IU] via SUBCUTANEOUS
  Filled 2015-10-12 (×2): qty 0.3

## 2015-10-12 MED ORDER — WARFARIN SODIUM 5 MG PO TABS: 5.0000 mg | ORAL_TABLET | Freq: Once | ORAL | Status: DC

## 2015-10-12 MED ORDER — WARFARIN SODIUM 2 MG PO TABS
4.0000 mg | ORAL_TABLET | Freq: Once | ORAL | Status: AC
  Administered 2015-10-12: 4 mg via ORAL
  Filled 2015-10-12: qty 2

## 2015-10-12 MED ORDER — SODIUM POLYSTYRENE SULFONATE 15 GM/60ML PO SUSP
15.0000 g | Freq: Once | ORAL | Status: AC
  Administered 2015-10-12: 15 g via ORAL
  Filled 2015-10-12: qty 60

## 2015-10-12 NOTE — Progress Notes (Addendum)
[place] for Argatroban --> Warfarin  Indication: DVT  Patient Measurements: Height: 6' (182.9 cm) Weight:  (pt refused to stand nurse notfied) IBW/kg (Calculated) : 77.6  Vital Signs: Temp: 97 F (36.1 C) (12/31 0559) Temp Source: Oral (12/31 0559) BP: 166/44 mmHg (12/31 0559) Pulse Rate: 92 (12/31 0559)  Labs:  Recent Labs  10/10/15 0527 10/10/15 1130 10/10/15 1740 10/11/15 0551 10/11/15 1030 10/12/15 0308 10/12/15 0636  HGB 9.8*  --   --  9.3*  --  9.6*  --   HCT 33.1*  --   --  31.2*  --  32.5*  --   PLT 247  --   --  251  --  239  --   APTT 68*  --   --  71*  --   --  78*  LABPROT 29.6*  --   --  30.1*  --   --  37.5*  INR 2.86*  --   --  2.94*  --   --  3.93*  CREATININE 4.57*  --   --   --  4.78* 4.74*  --   TROPONINI 0.10* 0.09* 0.09*  --   --   --   --     Assessment: DVT left popliteal vein. VQ negative for PE.   Argatroban started 12/26  due to porcine allergy. APTT remains therapeutic at 78 sec on 1.74mcg/kg/min. INR prior to warfarin start 2.48. Current INR is 3.93. Hgb and plt stable. No bleeding noted. Day 3/5 overlap.  DDI: Fluconazole   Argatroban and warfarin should be administered with at least 5 day overlap and an INR = 4 for 24 hours. After 5 days, If INR <4, continue concomitant therapy. If INR >4, stop argatroban infusion and repeat INR 4-6 hours later (do not discard bag)      Goal of Therapy:  INR >4 while on concomitant argatroban aPTT 50-90 seconds Monitor platelets by anticoagulation protocol: Yes    Plan:  Continue Argatroban at 1.2 mcg/kg/min   Warfarin 4 mg x 1 as INR with relatively large increase in last 24 hours and on Fluconazole Day 3 of overlap  Daily CBC, aPTT, INR Monitor for s/sx bleeding   Vincenza Hews, PharmD, BCPS 10/12/2015, 9:11 AM Pager: [phone]

## 2015-10-12 NOTE — Progress Notes (Signed)
PROGRESS NOTE  [name] [name] [id number] DOB: [birth year] DOA: 10/07/2015 PCP: Hoyt Koch, MD  HPI [name] [name] is a 49 y.o. male with a Past Medical History of CKD s/p transplant, HTN, DM, CHF, HLD, AI, COPD, GETD, who presents with DVT  Subjective: - No complaints this morning, his overall upset about his kidneys failing  Assessment/Plan: Acute DVT left popliteal vein Questionable etiology. Patient with an allergy to pork derived products and as such patient has been started on argatroban. VQ scan negative for PE.  - coumadin per pharmacy. Will need full bridge with argatroban. Today overlap day 4 / 5  Shortness of breath/pulmonary edema/acute on chronic diastolic CHF exacerbation Likely secondary to progressive chronic kidney disease. Patient with bibasilar crackles. Chest x-ray on admission consistent with vascular congestion.  - diuresis with Lasix, on hold still due to renal failure - Creatinine overall stable, BUN increasing  Chronic kidney disease stage 4/5 history of renal transplant 2009 Patient with creatinine currently at 3.87. Patient with recent fistula placement in right upper extremity. Patient with volume overload noted on examination and per chest x-ray. Continue home medications - hold Bactrim due to renal failure   Diabetes mellitus type 2 Hemoglobin A1c was 6.5 on 08/26/2015. Place patient on Lantus 25 units daily. NovoLog 5 units 3 times daily with meals. Sliding scale insulin. Outpatient follow-up with endocrinology  Hypertension Stable. Continue clonidine, on IV Lasix. Add Norvasc. Follow.  Hypokalemia Resolved.  History of enterococcus bacteremia/aortic valve endocarditis with severe aortic insufficiency at [facility] earlier this year Surgery was recommended but patient declined. Repeat 2-D echo July revealed only mild to moderate aortic insufficiency.  Obstructive sleep apnea  Anxiety Continue home regimen of anxiolytics  Code  Status: Full Family Communication: Updated patient. No family at bedside. Disposition Plan: Home when medically stable.   Consultants:  Nephrology  Procedures:  Lower extremity Dopplers 10/07/2015  VQ scan 10/08/2015  Chest x-ray 10/07/2015  Antibiotics:  Chronic Bactrim started prior to admission, now on hold  Chronic valganciclovir started prior to admission  Objective: Filed Vitals:   10/11/15 1959 10/12/15 0559  BP: 171/52 166/44  Pulse: 81 92  Temp: 97.2 F (36.2 C) 97 F (36.1 C)  Resp: 18 18    Intake/Output Summary (Last 24 hours) at 10/12/15 1104 Last data filed at 10/12/15 0912  Gross per 24 hour  Intake 1033.6 ml  Output    550 ml  Net  483.6 ml   Filed Weights   10/10/15 0445 10/11/15 0413 10/12/15 0700  Weight: 134.4 kg (296 lb 4.8 oz) 129.457 kg (285 lb 6.4 oz) 138.7 kg (305 lb 12.5 oz)    Exam:  General:  NAD  Cardiovascular: RRR  Respiratory: Bibasilar crackles  Abdomen: Soft, nontender, nondistended, positive bowel sounds.  Musculoskeletal: No clubbing or cyanosis. trace lower extremity edema. Graft in right upper extremity with good thrill.  Neuro: non focal   Data Reviewed: Basic Metabolic Panel:  Recent Labs Lab 10/08/15 0558 10/09/15 0710 10/10/15 0527 10/11/15 1030 10/12/15 0308  NA 144 140 139 140 142  K 4.8 5.4* 5.8* 5.3* 5.7*  CL 110 106 107 106 107  CO2 25 24 25 22  21*  GLUCOSE 250* 370* 392* 333* 358*  BUN 67* 80* 87* 94* 100*  CREATININE 3.87* 4.39* 4.57* 4.78* 4.74*  CALCIUM 7.8* 7.6* 7.4* 6.9* 6.9*  PHOS  --  6.5*  --   --   --    Liver Function Tests:  Recent Labs Lab 10/09/15  0710  ALBUMIN 2.8*   CBC:  Recent Labs Lab 10/08/15 0558 10/09/15 0710 10/10/15 0527 10/11/15 0551 10/12/15 0308  WBC 4.1 3.6* 4.9 5.1 5.9  HGB 9.0* 9.4* 9.8* 9.3* 9.6*  HCT 29.8* 32.9* 33.1* 31.2* 32.5*  MCV 92.3 92.7 93.0 93.1 92.6  PLT 217 242 247 251 239   Cardiac Enzymes:  Recent Labs Lab 10/08/15 2301  10/09/15 1310 10/10/15 0527 10/10/15 1130 10/10/15 1740  TROPONINI 0.17* 0.15* 0.10* 0.09* 0.09*   BNP (last 3 results)  Recent Labs  10/07/15 1145  BNP 1430.7*   CBG:  Recent Labs Lab 10/11/15 0538 10/11/15 1255 10/11/15 1635 10/11/15 2037 10/12/15 0710  GLUCAP 303* 225* 206* 312* 334*    Studies: No results found.  Scheduled Meds: . amLODipine  5 mg Oral Daily  . aspirin EC  81 mg Oral Daily  . calcitRIOL  1.5 mcg Oral Daily  . cloNIDine  0.3 mg Oral TID  . diazepam  5 mg Oral BID  . febuxostat  80 mg Oral Daily  . fluconazole  50 mg Oral Q M,W,F  . insulin aspart  0-15 Units Subcutaneous TID WC  . insulin aspart  5 Units Subcutaneous TID WC  . insulin glargine  25 Units Subcutaneous QHS  . mycophenolate  750 mg Oral BID  . pantoprazole  40 mg Oral Daily  . polyethylene glycol  17 g Oral BID  . predniSONE  5 mg Oral Q breakfast  . pregabalin  100 mg Oral QHS  . rosuvastatin  20 mg Oral QHS  . sodium chloride  3 mL Intravenous Q12H  . tacrolimus  2.5 mg Oral BID  . valGANciclovir  450 mg Oral Once per day on Mon Wed Fri  . warfarin  4 mg Oral ONCE-1800  . Warfarin - Pharmacist Dosing Inpatient   Does not apply q1800   Continuous Infusions: . argatroban 1.2 mcg/kg/min (10/11/15 2135)    Principal Problem:   Deep vein thrombosis (DVT) of left lower extremity (HCC) Active Problems:   Obstructive sleep apnea   KIDNEY TRANSPLANTATION, HX OF   Benign essential HTN   H/O kidney transplant   Diabetes mellitus, type 2 (HCC)   Diabetic peripheral neuropathy (HCC)   Chronic kidney disease (CKD), stage IV (severe) (HCC)   CHF (congestive heart failure) (HCC)   Hyperkalemia   Prolonged Q-T interval on ECG   Chronic deep vein thrombosis (DVT) of left lower extremity ([place])   Acute on chronic congestive heart failure ([place])   Acute pulmonary edema (HCC)   Marzetta Board MD [facility] Pager [phone]. If 7PM-7AM, please contact night-coverage at  [email or web address], password [facility] 10/12/2015, 11:04 AM  LOS: 5 days

## 2015-10-12 NOTE — Progress Notes (Signed)
Admit: 10/07/2015 LOS: 5  12M with CKD4T, progressive, admit with acute LLE DVT   Subjective:  Remains on argatroban, cont on warfarin Diuretics on hold; daily weights erratic Pt w/o complaints INR <3  12/30 0701 - 12/31 0700 In: 1013.6 [P.O.:820; I.V.:193.6] Out: 550 [Urine:550]  Filed Weights   10/09/15 0749 10/10/15 0445 10/11/15 0413  Weight: 125.3 kg (276 lb 3.8 oz) 134.4 kg (296 lb 4.8 oz) 129.457 kg (285 lb 6.4 oz)    Scheduled Meds: . amLODipine  5 mg Oral Daily  . aspirin EC  81 mg Oral Daily  . calcitRIOL  1.5 mcg Oral Daily  . cloNIDine  0.3 mg Oral TID  . diazepam  5 mg Oral BID  . febuxostat  80 mg Oral Daily  . fluconazole  50 mg Oral Q M,W,F  . insulin aspart  0-15 Units Subcutaneous TID WC  . insulin aspart  5 Units Subcutaneous TID WC  . insulin glargine  25 Units Subcutaneous QHS  . mycophenolate  750 mg Oral BID  . pantoprazole  40 mg Oral Daily  . predniSONE  5 mg Oral Q breakfast  . pregabalin  100 mg Oral QHS  . rosuvastatin  20 mg Oral QHS  . sodium chloride  3 mL Intravenous Q12H  . tacrolimus  2.5 mg Oral BID  . valGANciclovir  450 mg Oral Once per day on Mon Wed Fri  . Warfarin - Pharmacist Dosing Inpatient   Does not apply q1800   Continuous Infusions: . argatroban 1.2 mcg/kg/min (10/11/15 2135)   PRN Meds:.sodium chloride, acetaminophen **OR** acetaminophen, bisacodyl, clonazePAM, hydrALAZINE, HYDROcodone-acetaminophen, ondansetron **OR** ondansetron (ZOFRAN) IV, sodium chloride, technetium TC 58M diethylenetriame-pentaacetic acid  Current Labs: reviewed    Physical Exam:  Blood pressure 166/44, pulse 92, temperature 97 F (36.1 C), temperature source Oral, resp. rate 18, height 6' (1.829 m), weight 129.457 kg (285 lb 6.4 oz), SpO2 91 %. GEN: NAD ENT: NCAT EYES: EOMI CV: RRR PULM: diminished in the bases ABD: s/nt/nd SKIN: no rashes/leions EXT:LLE 3+ edema, RLE no edema RUE BVT AVF: +B/T  A 1. Progressive CKD4T with AKI, ddKT  2009 [facility], now with CAN, recent ACR (tx with IVIG and pulse steroids); ? AKI 2/2 diuersis, stable in past 24h 2. Chronic IS on Tac/MMF/Pred; Fluc/Ganciclovir 3. Acute LLE DVT on Argatroban and transitioning to warfarin; VQ negative 4. 2HPTH on VDRA 5. Porcine Allergy 6. Anemia, normocytic; stable 7. CHF 8. COPD 9. DM 10. Aortic Insufficiency hx/o Aortic IE 11/2014 11. Gout on febuxostat 12. Hyperkalemia, mild -- 2/2 CNI + TMP/SMX 13. S/p R BVT 09/27/2015  P 1. No change to IS 2. Cont to hold lasix and TMP/SMX 3. No uremia at current time 4. Check Tac Trough (turn around quite slow, given last night at 2321)  Daily weights, Daily Renal Panel, Strict I/Os, Avoid nephrotoxins (NSAIDs, judicious IV Contrast)   Pearson Grippe MD 10/12/2015, 8:01 AM   Recent Labs Lab 10/09/15 0710 10/10/15 0527 10/11/15 1030 10/12/15 0308  NA 140 139 140 142  K 5.4* 5.8* 5.3* 5.7*  CL 106 107 106 107  CO2 24 25 22  21*  GLUCOSE 370* 392* 333* 358*  BUN 80* 87* 94* 100*  CREATININE 4.39* 4.57* 4.78* 4.74*  CALCIUM 7.6* 7.4* 6.9* 6.9*  PHOS 6.5*  --   --   --     Recent Labs Lab 10/10/15 0527 10/11/15 0551 10/12/15 0308  WBC 4.9 5.1 5.9  HGB 9.8* 9.3* 9.6*  HCT 33.1* 31.2*  32.5*  MCV 93.0 93.1 92.6  PLT 247 251 239

## 2015-10-13 ENCOUNTER — Inpatient Hospital Stay (HOSPITAL_COMMUNITY): Payer: Medicare Other

## 2015-10-13 DIAGNOSIS — J9601 Acute respiratory failure with hypoxia: Secondary | ICD-10-CM

## 2015-10-13 DIAGNOSIS — G9340 Encephalopathy, unspecified: Secondary | ICD-10-CM

## 2015-10-13 DIAGNOSIS — J9602 Acute respiratory failure with hypercapnia: Secondary | ICD-10-CM

## 2015-10-13 LAB — BLOOD GAS, ARTERIAL
Acid-base deficit: 5.3 mmol/L — ABNORMAL HIGH (ref 0.0–2.0)
Acid-base deficit: 4.6 mmol/L — ABNORMAL HIGH (ref 0.0–2.0)
Bicarbonate: 21.9 mEq/L (ref 20.0–24.0)
Bicarbonate: 22.5 mEq/L (ref 20.0–24.0)
Delivery systems: POSITIVE
Drawn by: 21338
Expiratory PAP: 5
FIO2: 0.4
Inspiratory PAP: 14
O2 Content: 2.5 L/min
O2 Saturation: 92.6 %
O2 Saturation: 93.6 %
Patient temperature: 98.6
Patient temperature: 98.6
RATE: 12 resp/min
TCO2: 23.8 mmol/L (ref 0–100)
TCO2: 24.4 mmol/L (ref 0–100)
pCO2 arterial: 61 mmHg (ref 35.0–45.0)
pCO2 arterial: 62 mmHg (ref 35.0–45.0)
pH, Arterial: 7.181 — CL (ref 7.350–7.450)
pH, Arterial: 7.185 — CL (ref 7.350–7.450)
pO2, Arterial: 73.5 mmHg — ABNORMAL LOW (ref 80.0–100.0)
pO2, Arterial: 89.7 mmHg (ref 80.0–100.0)

## 2015-10-13 LAB — BASIC METABOLIC PANEL
Anion gap: 11 (ref 5–15)
Anion gap: 12 (ref 5–15)
BUN: 106 mg/dL — ABNORMAL HIGH (ref 6–20)
BUN: 111 mg/dL — ABNORMAL HIGH (ref 6–20)
CO2: 23 mmol/L (ref 22–32)
CO2: 21 mmol/L — ABNORMAL LOW (ref 22–32)
Calcium: 7.1 mg/dL — ABNORMAL LOW (ref 8.9–10.3)
Calcium: 6.9 mg/dL — ABNORMAL LOW (ref 8.9–10.3)
Chloride: 106 mmol/L (ref 101–111)
Chloride: 107 mmol/L (ref 101–111)
Creatinine, Ser: 5.32 mg/dL — ABNORMAL HIGH (ref 0.61–1.24)
Creatinine, Ser: 5.49 mg/dL — ABNORMAL HIGH (ref 0.61–1.24)
GFR calc Af Amer: 13 mL/min — ABNORMAL LOW (ref >60)
GFR calc Af Amer: 13 mL/min — ABNORMAL LOW (ref >60)
GFR calc non Af Amer: 11 mL/min — ABNORMAL LOW (ref >60)
GFR calc non Af Amer: 11 mL/min — ABNORMAL LOW (ref >60)
Glucose, Bld: 411 mg/dL — ABNORMAL HIGH (ref 65–99)
Glucose, Bld: 262 mg/dL — ABNORMAL HIGH (ref 65–99)
Potassium: 5.8 mmol/L — ABNORMAL HIGH (ref 3.5–5.1)
Potassium: 6 mmol/L — ABNORMAL HIGH (ref 3.5–5.1)
Sodium: 140 mmol/L (ref 135–145)
Sodium: 140 mmol/L (ref 135–145)

## 2015-10-13 LAB — PROTIME-INR
INR: 6.05 (ref 0.00–1.49)
INR: 3.15 — ABNORMAL HIGH (ref 0.00–1.49)
Prothrombin Time: 51.9 seconds — ABNORMAL HIGH (ref 11.6–15.2)
Prothrombin Time: 31.8 seconds — ABNORMAL HIGH (ref 11.6–15.2)

## 2015-10-13 LAB — CBC
HCT: 33.5 % — ABNORMAL LOW (ref 39.0–52.0)
Hemoglobin: 9.8 g/dL — ABNORMAL LOW (ref 13.0–17.0)
MCH: 27.5 pg (ref 26.0–34.0)
MCHC: 29.3 g/dL — ABNORMAL LOW (ref 30.0–36.0)
MCV: 93.8 fL (ref 78.0–100.0)
Platelets: 267 10*3/uL (ref 150–400)
RBC: 3.57 MIL/uL — ABNORMAL LOW (ref 4.22–5.81)
RDW: 15.4 % (ref 11.5–15.5)
WBC: 7.1 10*3/uL (ref 4.0–10.5)

## 2015-10-13 LAB — GLUCOSE, CAPILLARY
Glucose-Capillary: 323 mg/dL — ABNORMAL HIGH (ref 65–99)
Glucose-Capillary: 240 mg/dL — ABNORMAL HIGH (ref 65–99)
Glucose-Capillary: 214 mg/dL — ABNORMAL HIGH (ref 65–99)
Glucose-Capillary: 186 mg/dL — ABNORMAL HIGH (ref 65–99)
Glucose-Capillary: 137 mg/dL — ABNORMAL HIGH (ref 65–99)
Glucose-Capillary: 107 mg/dL — ABNORMAL HIGH (ref 65–99)

## 2015-10-13 LAB — URINALYSIS, ROUTINE W REFLEX MICROSCOPIC
Bilirubin Urine: NEGATIVE
Glucose, UA: 250 mg/dL — AB
Ketones, ur: NEGATIVE mg/dL
Nitrite: NEGATIVE
Protein, ur: >300 mg/dL — AB
Specific Gravity, Urine: 1.021 (ref 1.005–1.030)
pH: 5 (ref 5.0–8.0)

## 2015-10-13 LAB — AMMONIA: Ammonia: 37 umol/L — ABNORMAL HIGH (ref 9–35)

## 2015-10-13 LAB — URINE MICROSCOPIC-ADD ON

## 2015-10-13 LAB — MRSA PCR SCREENING: MRSA by PCR: POSITIVE — AB

## 2015-10-13 LAB — APTT: aPTT: 85 seconds — ABNORMAL HIGH (ref 24–37)

## 2015-10-13 MED ORDER — TACROLIMUS 1 MG PO CAPS
1.0000 mg | ORAL_CAPSULE | Freq: Two times a day (BID) | ORAL | Status: DC
  Administered 2015-10-14 – 2015-10-26 (×23): 1 mg via ORAL
  Filled 2015-10-13 (×33): qty 1

## 2015-10-13 MED ORDER — CLONIDINE HCL 0.3 MG/24HR TD PTWK
0.3000 mg | MEDICATED_PATCH | TRANSDERMAL | Status: DC
  Administered 2015-10-13 – 2015-10-20 (×2): 0.3 mg via TRANSDERMAL
  Filled 2015-10-13 (×2): qty 1

## 2015-10-13 MED ORDER — ANTICOAGULANT SODIUM CITRATE 4% (200MG/5ML) IV SOLN
5.0000 mL | Status: AC
  Filled 2015-10-13: qty 250

## 2015-10-13 MED ORDER — ANTICOAGULANT SODIUM CITRATE 4% (200MG/5ML) IV SOLN
5.0000 mL | Status: DC | PRN
  Filled 2015-10-13: qty 250

## 2015-10-13 MED ORDER — SODIUM CHLORIDE 0.9 % IV SOLN: 100.0000 mL | INTRAVENOUS | Status: DC | PRN

## 2015-10-13 MED ORDER — LIDOCAINE HCL (PF) 1 % IJ SOLN
INTRAMUSCULAR | Status: AC
  Filled 2015-10-13: qty 5

## 2015-10-13 MED ORDER — CHLORHEXIDINE GLUCONATE CLOTH 2 % EX PADS
6.0000 | MEDICATED_PAD | Freq: Every day | CUTANEOUS | Status: AC
  Administered 2015-10-13 – 2015-10-17 (×4): 6 via TOPICAL

## 2015-10-13 MED ORDER — WARFARIN SODIUM 4 MG PO TABS
4.0000 mg | ORAL_TABLET | Freq: Once | ORAL | Status: DC
Start: 2015-10-13 — End: 2015-10-14
  Filled 2015-10-13: qty 1

## 2015-10-13 MED ORDER — ALTEPLASE 2 MG IJ SOLR
2.0000 mg | Freq: Once | INTRAMUSCULAR | Status: DC | PRN
  Filled 2015-10-13: qty 2

## 2015-10-13 MED ORDER — WARFARIN SODIUM 2.5 MG PO TABS: 2.5000 mg | ORAL_TABLET | Freq: Once | ORAL | Status: DC

## 2015-10-13 MED ORDER — MUPIROCIN 2 % EX OINT
1.0000 "application " | TOPICAL_OINTMENT | Freq: Two times a day (BID) | CUTANEOUS | Status: AC
  Administered 2015-10-13 – 2015-10-17 (×9): 1 via NASAL
  Filled 2015-10-13 (×4): qty 22

## 2015-10-13 MED ORDER — ALBUMIN HUMAN 25 % IV SOLN
INTRAVENOUS | Status: AC
  Filled 2015-10-13: qty 100

## 2015-10-13 NOTE — Progress Notes (Signed)
Pt had been taken off Bipap by RN when I entered room and placed on a NRB due to nausea. RT will continue to monitor.

## 2015-10-13 NOTE — Progress Notes (Signed)
Admit: 10/07/2015 LOS: 6  6M with CKD4T, progressive, admit with acute LLE DVT   Subjective:  INR 6 Confused/somnolent this AM, will awaken and converse but can't hold attention and disoriented Valium held overnight On pregabalin 100/d  BUN up further, mildly hyperkalemic Tac level (about 10h value) pending  12/31 0701 - 01/01 0700 In: 1719.2 [P.O.:1520; I.V.:199.2] Out: 300 [Urine:300]  Filed Weights   10/11/15 0413 10/12/15 0700 10/13/15 0501  Weight: 129.457 kg (285 lb 6.4 oz) 138.7 kg (305 lb 12.5 oz) 136.805 kg (301 lb 9.6 oz)    Scheduled Meds: . amLODipine  5 mg Oral Daily  . aspirin EC  81 mg Oral Daily  . calcitRIOL  1.5 mcg Oral Daily  . cloNIDine  0.3 mg Oral TID  . diazepam  5 mg Oral BID  . febuxostat  80 mg Oral Daily  . fluconazole  50 mg Oral Q M,W,F  . insulin aspart  0-15 Units Subcutaneous TID WC  . insulin aspart  5 Units Subcutaneous TID WC  . insulin glargine  30 Units Subcutaneous QHS  . mycophenolate  750 mg Oral BID  . pantoprazole  40 mg Oral Daily  . polyethylene glycol  17 g Oral BID  . predniSONE  5 mg Oral Q breakfast  . rosuvastatin  20 mg Oral QHS  . sodium chloride  3 mL Intravenous Q12H  . tacrolimus  2.5 mg Oral BID  . valGANciclovir  450 mg Oral Once per day on Mon Wed Fri  . Warfarin - Pharmacist Dosing Inpatient   Does not apply q1800   Continuous Infusions: . argatroban 1.2 mcg/kg/min (10/13/15 0238)   PRN Meds:.sodium chloride, acetaminophen **OR** acetaminophen, bisacodyl, clonazePAM, hydrALAZINE, HYDROcodone-acetaminophen, ondansetron **OR** ondansetron (ZOFRAN) IV, sodium chloride, technetium TC 63M diethylenetriame-pentaacetic acid  Current Labs: reviewed    Physical Exam:  Blood pressure 176/46, pulse 91, temperature 97.8 F (36.6 C), temperature source Oral, resp. rate 18, height 6' (1.829 m), weight 136.805 kg (301 lb 9.6 oz), SpO2 91 %. GEN: NAD ENT: NCAT EYES: EOMI CV: RRR PULM: diminished in the bases ABD:  s/nt/nd SKIN: no rashes/leions EXT:LLE 3+ edema, RLE no edema RUE BVT AVF: +B/T  A 1. Progressive CKD4T with AKI, ddKT 2009 [facility], now with CAN, recent ACR (tx with IVIG and pulse steroids); ? AKI 2/2 diuersis, worsened in past 24h 2. Chronic IS on Tac/MMF/Pred; Fluc/Ganciclovir (TMP/SMX held) 3. Acute LLE DVT on Argatroban and transitioning to warfarin; VQ negative 4. AMS, ? Lyrica, BZD, uremia? 5. 2HPTH on VDRA 6. Porcine Allergy 7. Anemia, normocytic; stable 8. CHF 9. COPD 10. DM 11. Aortic Insufficiency hx/o Aortic IE 11/2014 Enteroccossu aecalis 12. Gout on febuxostat 13. Hyperkalemia, mild -- 2/2 CNI + TMP/SMX 14. S/p R BVT 09/27/2015 +B/T 15. ? Tac Toxicity -- CNS, Renal, Hyperkalemia 16. Hx/o urethral stricture  P 1. Empirically reduce Tac from 2.5mg  BID to 1mg  BID, ? If having nephrotoxicity and neurotoxicity, tac level pending 2. Cont to hold lasix and TMP/SMX; he wasn't profoundly immunosuppressed (no Campath during Tx for ACR; just pulse steroids), also hold Fluconazole (mod inhibitor for Tac) and ValCyte 3. Hold lyrica and valium; if persists and GFR cont to worsen will need to start RRT 4. Transplant [place] today, eval for obstruction 5. Would not Tx hyperkalemia unless K > 6 with kayexalate 6. BID BMP 7. Daily weights, Daily Renal Panel, Strict I/Os, Avoid nephrotoxins (NSAIDs, judicious IV Contrast)   Pearson Grippe MD 10/13/2015, 7:38 AM   Recent Labs  Lab 10/09/15 0710  10/11/15 1030 10/12/15 0308 10/13/15 0214  NA 140  < > 140 142 140  K 5.4*  < > 5.3* 5.7* 5.8*  CL 106  < > 106 107 106  CO2 24  < > 22 21* 23  GLUCOSE 370*  < > 333* 358* 411*  BUN 80*  < > 94* 100* 106*  CREATININE 4.39*  < > 4.78* 4.74* 5.32*  CALCIUM 7.6*  < > 6.9* 6.9* 7.1*  PHOS 6.5*  --   --   --   --   < > = values in this interval not displayed.  Recent Labs Lab 10/11/15 0551 10/12/15 0308 10/13/15 0214  WBC 5.1 5.9 7.1  HGB 9.3* 9.6* 9.8*  HCT 31.2* 32.5* 33.5*  MCV 93.1  92.6 93.8  PLT 251 239 267

## 2015-10-13 NOTE — Progress Notes (Addendum)
[place] for Argatroban --> Warfarin  Indication: DVT  Patient Measurements: Height: 6' (182.9 cm) Weight: (!) 301 lb 9.6 oz (136.805 kg) (scale a) IBW/kg (Calculated) : 77.6  Vital Signs: Temp: 97.8 F (36.6 C) (01/01 0501) Temp Source: Oral (01/01 0501) BP: 176/46 mmHg (01/01 0501) Pulse Rate: 91 (01/01 0501)  Labs:  Recent Labs  10/10/15 1130 10/10/15 1740  10/11/15 0551 10/11/15 1030 10/12/15 0308 10/12/15 0636 10/13/15 0214  HGB  --   --   < > 9.3*  --  9.6*  --  9.8*  HCT  --   --   --  31.2*  --  32.5*  --  33.5*  PLT  --   --   --  251  --  239  --  267  APTT  --   --   --  71*  --   --  78* 85*  LABPROT  --   --   --  30.1*  --   --  37.5* 51.9*  INR  --   --   --  2.94*  --   --  3.93* 6.05*  CREATININE  --   --   --   --  4.78* 4.74*  --  5.32*  TROPONINI 0.09* 0.09*  --   --   --   --   --   --   < > = values in this interval not displayed.  Assessment: DVT left popliteal vein. VQ negative for PE.   Argatroban started 12/26  due to porcine allergy. APTT remains therapeutic at 85 sec on 1.33mcg/kg/min. INR prior to warfarin start 2.48. Current INR 6 (up from 3.9 previous day). CBC stable with no bleeding noted. Day 4/5 overlap. Pt with AMS/confusion and primary concerned for potential bleeding and argatroban stopped. CT ordered. Since drip already stopped will assess INR in 4- 6 hours.  DDI: Fluconazole   Argatroban and warfarin should be administered with at least 5 day overlap and an INR = 4 for 24 hours. After 5 days, If INR <4, continue concomitant therapy. If INR >4, stop argatroban infusion and repeat INR 4-6 hours later (do not discard bag)      Goal of Therapy:  INR >4 while on concomitant argatroban aPTT 50-90 seconds Monitor platelets by anticoagulation protocol: Yes    Plan:  1. F/u labs and test and INR while off argatroban. 2. Further recs later today  Vincenza Hews, PharmD, BCPS 10/13/2015, 8:34  AM Pager: [phone]

## 2015-10-13 NOTE — Progress Notes (Signed)
[place] for Argatroban --> Warfarin  Indication: DVT  Patient Measurements: Height: 6' (182.9 cm) Weight: (!) 320 lb 8.8 oz (145.4 kg) IBW/kg (Calculated) : 77.6  Vital Signs: Temp: 98.7 F (37.1 C) (01/01 1122) Temp Source: Oral (01/01 1122) BP: 152/55 mmHg (01/01 1602) Pulse Rate: 76 (01/01 1602)  Labs:  Recent Labs  10/10/15 1740  10/11/15 0551  10/12/15 0308 10/12/15 0636 10/13/15 0214 10/13/15 1441  HGB  --   < > 9.3*  --  9.6*  --  9.8*  --   HCT  --   --  31.2*  --  32.5*  --  33.5*  --   PLT  --   --  251  --  239  --  267  --   APTT  --   --  71*  --   --  78* 85*  --   LABPROT  --   < > 30.1*  --   --  37.5* 51.9* 31.8*  INR  --   < > 2.94*  --   --  3.93* 6.05* 3.15*  CREATININE  --   --   --   < > 4.74*  --  5.32* 5.49*  TROPONINI 0.09*  --   --   --   --   --   --   --   < > = values in this interval not displayed.  Assessment: 50 yo man admitted 10/07/2015 with DVT left popliteal vein. VQ negative for PE. Pharmacy consulted to dose warfarin.  Argatroban started 12/26  due to porcine allergy. INR prior to warfarin start 2.48, now 3.15 after ~4h stopping argatroban. CBC stable with no bleeding noted. Received 4 days argatroban and warfarin overlap, which was stopped early d/t concern for head bleed and AMS. Head CT today negative.  DDI: Fluconazole last dose 2 days ago     Goal of Therapy:  INR 2-3  Monitor platelets by anticoagulation protocol: Yes    Plan:  1. Warfarin 4 mg x1 tonight 2. Daily INR 3. Monitor s/sx bleeding    Heloise Ochoa, Pharm.D., BCPS PGY2 Cardiology Pharmacy Resident Pager: [phone]  10/13/2015, 4:55 PM

## 2015-10-13 NOTE — Procedures (Addendum)
Under sterile conditions and using US guidance a 24cm 3-lumen temporary Trialysis dialysis catheter was placed into the R femoral vein w/o complication.  INR 3.1. Ready to use.  He has an anaphylaxis heparin allergy, will use citrate block.   Kelly Splinter MD [facility] pager [phone]    cell [phone] 10/13/2015, 5:44 PM

## 2015-10-13 NOTE — Progress Notes (Signed)
Report given to 3 Clinical cytogeneticist . Patient transferred to 3S06 via bed with rapid response nurse Ben to be placed on BiPAP per MD order . Patient belonging sent with to 3S06.

## 2015-10-13 NOTE — Progress Notes (Addendum)
PROGRESS NOTE  [name] [name] [id number] DOB: [birth year] DOA: 10/07/2015 PCP: Hoyt Koch, MD  HPI [name] [name] is a 50 y.o. male with a Past Medical History of CKD s/p transplant, HTN, DM, CHF, HLD, AI, COPD, GETD, who presents with DVT. He was started on argatroban and Coumadin.  Subjective: - patient confused this morning, drowsy, wakes up for short periods of time when prompted, asking for coffee  Assessment/Plan:  Acute encephalopathy - ABG, CXR, CT head given that he is on anticoagulation, place holding parameters on his Valium, discontinue Narcotics however he did not get any - hold Argatroban - worsening renal failure may contribute, tacrolimus level pending - afebrile, do not suspect sepsis - CT scan of the head negative - no Valium in the past 24 hours, no pain medications  Acute respiratory failure with hypercarbia with respiratory acidosis - place patient on BiPAP, move to SDU - consult PCCM - ABG 7.18/61/73 - in the setting of decreased respiratory drive.   Acute DVT left popliteal vein Questionable etiology. Patient with an allergy to pork derived products and as such patient has been started on argatroban. VQ scan negative for PE.  - coumadin per pharmacy. Will need full bridge with argatroban. Today overlap day 5 / 5, stop argatroban infusion and recheck INR per pharmacy  Shortness of breath/pulmonary edema/acute on chronic diastolic CHF exacerbation Likely secondary to progressive chronic kidney disease. Patient with bibasilar crackles. Chest x-ray on admission consistent with vascular congestion.  - diuresis with Lasix, on hold still due to renal failure - renal failure worsening, off of Lasix, Bactrim, tacro levels pending  Chronic kidney disease stage 4/5 history of renal transplant 2009 Patient with creatinine currently at 3.87. Patient with recent fistula placement in right upper extremity. Patient with volume overload noted on examination  and per chest x-ray. Continue home medications - hold Bactrim due to renal failure   Diabetes mellitus type 2 Hemoglobin A1c was 6.5 on 08/26/2015. Place patient on Lantus 25 units daily. NovoLog 5 units 3 times daily with meals. Sliding scale insulin. Outpatient follow-up with endocrinology  Hypertension Continue clonidine, Norvasc. Follow. - transition clonidine to patch while on BiPAP  Hyperkalemia - persistent, due to renal failure. Kayexalate if K > 6 per renal  History of enterococcus bacteremia/aortic valve endocarditis with severe aortic insufficiency at [facility] earlier this year Surgery was recommended but patient declined. Repeat 2-D echo July revealed only mild to moderate aortic insufficiency.  Obstructive sleep apnea  Anxiety Continue home regimen of anxiolytics with holding parameters as above  Code Status: Full Family Communication: Updated patient. No family at bedside. Disposition Plan: Home when medically stable.   Consultants:  Nephrology  PCCM  Procedures:  Lower extremity Dopplers 10/07/2015  VQ scan 10/08/2015  Chest x-ray 10/07/2015  Antibiotics:  Chronic Bactrim started prior to admission, now on hold  Chronic valganciclovir started prior to admission  Objective: Filed Vitals:   10/13/15 0011 10/13/15 0501  BP: 134/49 176/46  Pulse: 95 91  Temp:  97.8 F (36.6 C)  Resp: 18 18    Intake/Output Summary (Last 24 hours) at 10/13/15 0738 Last data filed at 10/13/15 0238  Gross per 24 hour  Intake 1719.17 ml  Output    300 ml  Net 1419.17 ml   Filed Weights   10/11/15 0413 10/12/15 0700 10/13/15 0501  Weight: 129.457 kg (285 lb 6.4 oz) 138.7 kg (305 lb 12.5 oz) 136.805 kg (301 lb 9.6 oz)    Exam:  General:  Sleepy, wakes up  Cardiovascular: RRR  Respiratory: Bibasilar crackles  Abdomen: Soft, nontender, nondistended, positive bowel sounds.  Musculoskeletal: No clubbing or cyanosis. trace lower extremity edema. Graft in  right upper extremity with good thrill.  Neuro: non focal, moves all 4  Data Reviewed: Basic Metabolic Panel:  Recent Labs Lab 10/09/15 0710 10/10/15 0527 10/11/15 1030 10/12/15 0308 10/13/15 0214  NA 140 139 140 142 140  K 5.4* 5.8* 5.3* 5.7* 5.8*  CL 106 107 106 107 106  CO2 24 25 22  21* 23  GLUCOSE 370* 392* 333* 358* 411*  BUN 80* 87* 94* 100* 106*  CREATININE 4.39* 4.57* 4.78* 4.74* 5.32*  CALCIUM 7.6* 7.4* 6.9* 6.9* 7.1*  PHOS 6.5*  --   --   --   --    Liver Function Tests:  Recent Labs Lab 10/09/15 0710  ALBUMIN 2.8*   CBC:  Recent Labs Lab 10/09/15 0710 10/10/15 0527 10/11/15 0551 10/12/15 0308 10/13/15 0214  WBC 3.6* 4.9 5.1 5.9 7.1  HGB 9.4* 9.8* 9.3* 9.6* 9.8*  HCT 32.9* 33.1* 31.2* 32.5* 33.5*  MCV 92.7 93.0 93.1 92.6 93.8  PLT 242 247 251 239 267   Cardiac Enzymes:  Recent Labs Lab 10/08/15 2301 10/09/15 1310 10/10/15 0527 10/10/15 1130 10/10/15 1740  TROPONINI 0.17* 0.15* 0.10* 0.09* 0.09*   BNP (last 3 results)  Recent Labs  10/07/15 1145  BNP 1430.7*   CBG:  Recent Labs Lab 10/11/15 2037 10/12/15 0710 10/12/15 1134 10/12/15 1620 10/12/15 2035  GLUCAP 312* 334* 239* 294* 384*    Studies: No results found.  Scheduled Meds: . amLODipine  5 mg Oral Daily  . aspirin EC  81 mg Oral Daily  . calcitRIOL  1.5 mcg Oral Daily  . cloNIDine  0.3 mg Oral TID  . diazepam  5 mg Oral BID  . febuxostat  80 mg Oral Daily  . fluconazole  50 mg Oral Q M,W,F  . insulin aspart  0-15 Units Subcutaneous TID WC  . insulin aspart  5 Units Subcutaneous TID WC  . insulin glargine  30 Units Subcutaneous QHS  . mycophenolate  750 mg Oral BID  . pantoprazole  40 mg Oral Daily  . polyethylene glycol  17 g Oral BID  . predniSONE  5 mg Oral Q breakfast  . rosuvastatin  20 mg Oral QHS  . sodium chloride  3 mL Intravenous Q12H  . tacrolimus  2.5 mg Oral BID  . valGANciclovir  450 mg Oral Once per day on Mon Wed Fri  . Warfarin - Pharmacist  Dosing Inpatient   Does not apply q1800   Continuous Infusions: . argatroban 1.2 mcg/kg/min (10/13/15 0238)    Principal Problem:   Deep vein thrombosis (DVT) of left lower extremity (HCC) Active Problems:   Obstructive sleep apnea   KIDNEY TRANSPLANTATION, HX OF   Benign essential HTN   H/O kidney transplant   Diabetes mellitus, type 2 (HCC)   Diabetic peripheral neuropathy (HCC)   Chronic kidney disease (CKD), stage IV (severe) (HCC)   CHF (congestive heart failure) (HCC)   Hyperkalemia   Prolonged Q-T interval on ECG   Chronic deep vein thrombosis (DVT) of left lower extremity ([place])   Acute on chronic congestive heart failure ([place])   Acute pulmonary edema (HCC)   Marzetta Board MD [facility] Pager [phone]. If 7PM-7AM, please contact night-coverage at [email or web address], password [facility] 10/13/2015, 7:38 AM  LOS: 6 days

## 2015-10-13 NOTE — Progress Notes (Signed)
Notes reviewed that pt with hypoxic/hypercapneic RF and worsened azotemia and hyperkalemia.  Will discuss with CCM and plan for HD tonight.

## 2015-10-13 NOTE — Progress Notes (Signed)
Paged dr Halford Chessman regarding ABG results pH 7.18, CO2 62, pO2 89.7 ,  Bicarb 22.5,

## 2015-10-13 NOTE — Progress Notes (Signed)
No improvement in ABG.  Will transfer to ICU.  Chesley Mires, MD [facility] Pulmonary/Critical Care 10/13/2015, 2:57 PM Pager:  [phone] After 3pm call: [phone]

## 2015-10-13 NOTE — Consult Note (Signed)
PULMONARY / CRITICAL CARE MEDICINE   Name: [name] [name] MRN: [id number] DOB: [birth year]    ADMISSION DATE:  10/07/2015 CONSULTATION DATE: 10/13/2015  REFERRING MD:  Letta Median  CHIEF COMPLAINT:  Altered mental status  HISTORY OF PRESENT ILLNESS:   50 yo male presented with shortness of breath.  He has hx of renal transplant and has progressive renal failure.  He had dialysis fistula placed on 09/28/15.  He has been getting more swelling in his legs.  He was found to have DVT in Lt popliteal vein.  He has hx of heparin allergy.  He was started on argatroban.  Renal was consulted.  He was tried on lasix.  He has hx of OSA.  He was started on coumadin.  He was getting scheduled doses of valium.  He was noted be more confused and sleepy on 10/13/15.  ABG showed acute hypercapnia.  He was transferred to SDU and PCCM consulted.  He was started on BiPAP.  RN notes mental status improving since starting BiPAP.  PAST MEDICAL HISTORY :  He  has a past medical history of Hypertension; Leg pain (02/05/11); Weight gain; Shortness of breath; Diminished eyesight; Hearing difficulty; Anemia; Nervousness(799.21); COPD (chronic obstructive pulmonary disease) ([place]); CHF (congestive heart failure) ([place]); Posttraumatic stress disorder; Allergic rhinitis; Dyslipidemia; Morbid obesity ([place]); Gait disorder; Bilateral carpal tunnel syndrome; Gastroparesis; Gout; Diabetic retinopathy ([place]); Renal transplant, status post (04/05/2014); Incomplete bladder emptying; Urethral stricture; Aortic insufficiency; Hyperlipidemia; Osteomyelitis ([place]); Endocarditis; Vitamin D deficiency; Secondary hyperparathyroidism ([place]); Rotator cuff disorder; Discitis of lumbar region; Obstructive sleep apnea; Claustrophobia; Diabetes mellitus; Diabetic peripheral neuropathy ([place]); GERD (gastroesophageal reflux disease); Sepsis ([place]); Endocarditis (11/28/14); Complication of anesthesia; Chronic kidney disease; Chronic renal allograft nephropathy; Chronic  kidney disease (CKD), stage IV (severe) ([place]); and DVT (deep venous thrombosis) ([place]) (10/07/2015).  PAST SURGICAL HISTORY: He  has past surgical history that includes Kidney transplant (2009); Cataract extraction w/ intraocular lens  implant, bilateral (Bilateral); AV fistula placement (Bilateral, 2015); Arteriovenous graft placement (Bilateral, "several"); Cystoscopy w/ internal urethrotomy (09/2014); Bascilic vein transposition (Right, 0000000); and Bascilic vein transposition (Right, 09/27/2015).  ALLERGIES: Penicillin Pork products ACE inhibitors   MEDICATIONS:  No current facility-administered medications on file prior to encounter.   Current Outpatient Prescriptions on File Prior to Encounter  Medication Sig  . calcitRIOL (ROCALTROL) 0.5 MCG capsule Take 1.5 mcg by mouth daily. Takes 3 tablets daily.  . clonazePAM (KLONOPIN) 0.5 MG tablet Take 1 mg by mouth at bedtime.   . cloNIDine (CATAPRES) 0.3 MG tablet Take 0.3 mg by mouth 3 (three) times daily.   . Febuxostat (ULORIC) 80 MG TABS Take 80 mg by mouth daily.  . fluconazole (DIFLUCAN) 50 MG tablet Take 50 mg by mouth every Monday, Wednesday, and Friday.   . furosemide (LASIX) 80 MG tablet Take 2 tablets by mouth 2 (two) times daily.  Marland Kitchen HYDROcodone-acetaminophen (NORCO/VICODIN) 5-325 MG tablet Take 1 tablet by mouth every 6 (six) hours as needed for severe pain.  . Insulin Glargine (TOUJEO SOLOSTAR) 300 UNIT/ML SOPN Inject 90 Units into the skin 2 (two) times daily.  . insulin regular human CONCENTRATED (HUMULIN R U-500 KWIKPEN) 500 UNIT/ML kwikpen Inject 10 Units into the skin once. U-500 insulin 10 before bfst, 25 before lunch and 20 before dinner, take 30 min before Sliding scale use for high sugars:  Sugar 150-199 take 2 units, 200-250= 4 units and over 250 take 6 units  . mycophenolate (CELLCEPT) 250 MG capsule Take 750 mg by mouth 2 (two) times daily.   Marland Kitchen  omeprazole (PRILOSEC) 20 MG capsule Take 20 mg by mouth daily.  .  predniSONE (DELTASONE) 5 MG tablet Take 5 mg by mouth daily.    . pregabalin (LYRICA) 100 MG capsule Take 100 mg by mouth every evening.   . rosuvastatin (CRESTOR) 20 MG tablet Take 1 tablet (20 mg total) by mouth at bedtime.  . tacrolimus (PROGRAF) 0.5 MG capsule Take 2.5 mg by mouth 2 (two) times daily. Takes 5 capsules 2 times a day  . tadalafil (CIALIS) 5 MG tablet Take 5 mg by mouth daily as needed for erectile dysfunction.  . valGANciclovir (VALCYTE) 450 MG tablet Take 450 mg by mouth. Takes 1 tablet  M/W/F.  Marland Kitchen ACCU-CHEK FASTCLIX LANCETS MISC Use to check blood sugar 4 times per day dx code E11.9  . diazepam (VALIUM) 5 MG tablet Take 1 tablet (5 mg total) by mouth 2 (two) times daily.  Marland Kitchen glucose blood (ACCU-CHEK SMARTVIEW) test strip Use as instructed to check blood sugar 4 times per day dx code E11.9  . Insulin Pen Needle (BD PEN NEEDLE NANO U/F) 32G X 4 MM MISC Use 5 per day  . ondansetron (ZOFRAN) 8 MG tablet Take 1 tablet (8 mg total) by mouth every 8 (eight) hours as needed for nausea or vomiting.  . sulfamethoxazole-trimethoprim (BACTRIM,SEPTRA) 400-80 MG tablet Take 1 tablet by mouth 3 (three) times a week. Takes 1 tablet M/W/F.    FAMILY HISTORY:  His indicated that his mother is alive. He indicated that his father is alive. He indicated that both of his sisters are alive. He indicated that all of his four brothers are alive. He indicated that his maternal grandmother is deceased. He indicated that his maternal grandfather is deceased. He indicated that his paternal grandmother is deceased. He indicated that his paternal grandfather is deceased.   SOCIAL HISTORY: He  reports that he has never smoked. He has never used smokeless tobacco. He reports that he does not drink alcohol or use illicit drugs.  REVIEW OF SYSTEMS:   Unable to obtain.  SUBJECTIVE:  Wearing BiPAP.  VITAL SIGNS: BP 162/47 mmHg  Pulse 83  Temp(Src) 98.7 F (37.1 C) (Oral)  Resp 14  Ht 6' (1.829 m)  Wt  320 lb 8.8 oz (145.4 kg)  BMI 43.46 kg/m2  SpO2 98%  INTAKE / OUTPUT: I/O last 3 completed shifts: In: 2152.8 [P.O.:1760; I.V.:392.8] Out: 850 [Urine:850]  PHYSICAL EXAMINATION: General:  sleepy Neuro:  Wakes up easily, follows simple commands, moving all extremities HEENT:  Pupils reactive, BiPAP mask on Cardiovascular:  Regular, 2/6 SM Lungs:  Decreased BS, no wheeze Abdomen:  Obese, soft, non tender Musculoskeletal:  1+ edema Skin:  No rashes  LABS:  BMET  Recent Labs Lab 10/11/15 1030 10/12/15 0308 10/13/15 0214  NA 140 142 140  K 5.3* 5.7* 5.8*  CL 106 107 106  CO2 22 21* 23  BUN 94* 100* 106*  CREATININE 4.78* 4.74* 5.32*  GLUCOSE 333* 358* 411*    Electrolytes  Recent Labs Lab 10/09/15 0710  10/11/15 1030 10/12/15 0308 10/13/15 0214  CALCIUM 7.6*  < > 6.9* 6.9* 7.1*  PHOS 6.5*  --   --   --   --   < > = values in this interval not displayed.  CBC  Recent Labs Lab 10/11/15 0551 10/12/15 0308 10/13/15 0214  WBC 5.1 5.9 7.1  HGB 9.3* 9.6* 9.8*  HCT 31.2* 32.5* 33.5*  PLT 251 239 267    Coag's  Recent Labs  Lab 10/11/15 0551 10/12/15 0636 10/13/15 0214  APTT 71* 78* 85*  INR 2.94* 3.93* 6.05*    Sepsis Markers No results for input(s): LATICACIDVEN, PROCALCITON, O2SATVEN in the last 168 hours.  ABG  Recent Labs Lab 10/13/15 1025  PHART 7.181*  PCO2ART 61.0*  PO2ART 73.5*    Liver Enzymes  Recent Labs Lab 10/09/15 0710  ALBUMIN 2.8*    Cardiac Enzymes  Recent Labs Lab 10/10/15 0527 10/10/15 1130 10/10/15 1740  TROPONINI 0.10* 0.09* 0.09*    Glucose  Recent Labs Lab 10/11/15 2037 10/12/15 0710 10/12/15 1134 10/12/15 1620 10/12/15 2035 10/13/15 0751  GLUCAP 312* 334* 239* 294* 384* 323*    Imaging Ct Head Wo Contrast  10/13/2015  CLINICAL DATA:  Diminished eye site, hearing difficulty, anemia. Gait disorder. PTSD. EXAM: CT HEAD WITHOUT CONTRAST TECHNIQUE: Contiguous axial images were obtained from the  base of the skull through the vertex without intravenous contrast. COMPARISON:  None. FINDINGS: There is no intra or extra-axial fluid collection or mass lesion. The basilar cisterns and ventricles have a normal appearance. There is no CT evidence for acute infarction or hemorrhage. Bone windows show no acute abnormality. There is atherosclerotic calcification of the internal carotid arteries. IMPRESSION: No evidence for acute intracranial abnormality. Electronically Signed   By: Nolon Nations M.D.   On: 10/13/2015 10:58   Dg Chest Port 1 View  10/13/2015  CLINICAL DATA:  Pt has kidney failure, currently on BiPAP for decreased O2 levels EXAM: PORTABLE CHEST 1 VIEW COMPARISON:  10/07/2015 FINDINGS: Heart is enlarged. Significant pulmonary edema identified bilaterally. More confluent opacity is identified at the left lung base, consistent with edema or infiltrate. IMPRESSION: Pulmonary edema. Left lower lobe edema and/or infiltrate. Electronically Signed   By: Nolon Nations M.D.   On: 10/13/2015 12:55     STUDIES:  12/26 Doppler legs >> acute DVT Lt popliteal vein 12/27 V/Q scan >> no PE 01/01 CT head >> no acute findings  CULTURES:  ANTIBIOTICS:  SIGNIFICANT EVENTS: 12/26 Admit, start argatroban 12/27 renal consulted 01/01 Transfer to SDU, start BiPAP  LINES/TUBES:  DISCUSSION: 50 yo male with hx of renal transplant and progressive renal failure found to have Lt leg DVT and progressive dyspnea due to volume overload.  He was transferred to ICU with altered mental status and acute hypoxic/hypercapnic respiratory failure.  He was getting scheduled valium.  Mental status and respiratory status show improvement with BiPAP.   ASSESSMENT / PLAN:  Acute hypoxic, hypercapnic respiratory failure. Hx of OSA. Plan: - continue BiPAP - f/u ABG, CXR - oxygen to keep SpO2 90 to 95%  Acute encephalopathy 2nd to respiratory failure. Plan: - monitor mental status - avoid benzo's,  narcotics  CKD stage V s/p renal transplant 2009. Hypervolemia. Acute on chronic diastolic CHF. Plan: - per renal, primary team  Acute Lt leg DVT. Plan: - anticoagulation per primary team  Okay to keep in SDU for now.  CC time 32 minutes.  Chesley Mires, MD [facility] 10/13/2015, 1:21 PM Pager:  [phone] After 3pm call: [phone]

## 2015-10-13 NOTE — Progress Notes (Signed)
Patient is alert and on bipap. Bp 182/49 HR 76 RR 13 Sat 100%. Patient able to nod his head to simple questions.

## 2015-10-14 ENCOUNTER — Inpatient Hospital Stay (HOSPITAL_COMMUNITY): Payer: Medicare Other

## 2015-10-14 DIAGNOSIS — I5023 Acute on chronic systolic (congestive) heart failure: Secondary | ICD-10-CM

## 2015-10-14 DIAGNOSIS — I82402 Acute embolism and thrombosis of unspecified deep veins of left lower extremity: Secondary | ICD-10-CM

## 2015-10-14 DIAGNOSIS — J810 Acute pulmonary edema: Secondary | ICD-10-CM

## 2015-10-14 DIAGNOSIS — I10 Essential (primary) hypertension: Secondary | ICD-10-CM

## 2015-10-14 LAB — GLUCOSE, CAPILLARY
Glucose-Capillary: 113 mg/dL — ABNORMAL HIGH (ref 65–99)
Glucose-Capillary: 116 mg/dL — ABNORMAL HIGH (ref 65–99)
Glucose-Capillary: 129 mg/dL — ABNORMAL HIGH (ref 65–99)
Glucose-Capillary: 132 mg/dL — ABNORMAL HIGH (ref 65–99)
Glucose-Capillary: 136 mg/dL — ABNORMAL HIGH (ref 65–99)
Glucose-Capillary: 168 mg/dL — ABNORMAL HIGH (ref 65–99)

## 2015-10-14 LAB — PROTIME-INR
INR: 2.31 — ABNORMAL HIGH (ref 0.00–1.49)
Prothrombin Time: 25.1 seconds — ABNORMAL HIGH (ref 11.6–15.2)

## 2015-10-14 LAB — BASIC METABOLIC PANEL
Anion gap: 10 (ref 5–15)
BUN: 73 mg/dL — ABNORMAL HIGH (ref 6–20)
CO2: 26 mmol/L (ref 22–32)
Calcium: 7.3 mg/dL — ABNORMAL LOW (ref 8.9–10.3)
Chloride: 106 mmol/L (ref 101–111)
Creatinine, Ser: 4.19 mg/dL — ABNORMAL HIGH (ref 0.61–1.24)
GFR calc Af Amer: 17 mL/min — ABNORMAL LOW (ref >60)
GFR calc non Af Amer: 15 mL/min — ABNORMAL LOW (ref >60)
Glucose, Bld: 166 mg/dL — ABNORMAL HIGH (ref 65–99)
Potassium: 5 mmol/L (ref 3.5–5.1)
Sodium: 142 mmol/L (ref 135–145)

## 2015-10-14 LAB — CBC WITH DIFFERENTIAL/PLATELET
Basophils Absolute: 0 10*3/uL (ref 0.0–0.1)
Basophils Relative: 0 %
Eosinophils Absolute: 0.1 10*3/uL (ref 0.0–0.7)
Eosinophils Relative: 1 %
HCT: 31.4 % — ABNORMAL LOW (ref 39.0–52.0)
Hemoglobin: 9.2 g/dL — ABNORMAL LOW (ref 13.0–17.0)
Lymphocytes Relative: 7 %
Lymphs Abs: 0.5 10*3/uL — ABNORMAL LOW (ref 0.7–4.0)
MCH: 26.8 pg (ref 26.0–34.0)
MCHC: 29.3 g/dL — ABNORMAL LOW (ref 30.0–36.0)
MCV: 91.5 fL (ref 78.0–100.0)
Monocytes Absolute: 0.5 10*3/uL (ref 0.1–1.0)
Monocytes Relative: 8 %
Neutro Abs: 5.8 10*3/uL (ref 1.7–7.7)
Neutrophils Relative %: 84 %
Platelets: 233 10*3/uL (ref 150–400)
RBC: 3.43 MIL/uL — ABNORMAL LOW (ref 4.22–5.81)
RDW: 15.3 % (ref 11.5–15.5)
WBC: 6.9 10*3/uL (ref 4.0–10.5)

## 2015-10-14 LAB — TACROLIMUS LEVEL: Tacrolimus (FK506) - LabCorp: 12 ng/mL (ref 2.0–20.0)

## 2015-10-14 LAB — POCT I-STAT 3, ART BLOOD GAS (G3+)
Bicarbonate: 27.2 mEq/L — ABNORMAL HIGH (ref 20.0–24.0)
O2 Saturation: 97 %
Patient temperature: 98.6
TCO2: 29 mmol/L (ref 0–100)
pCO2 arterial: 55.1 mmHg — ABNORMAL HIGH (ref 35.0–45.0)
pH, Arterial: 7.301 — ABNORMAL LOW (ref 7.350–7.450)
pO2, Arterial: 100 mmHg (ref 80.0–100.0)

## 2015-10-14 LAB — APTT: aPTT: 31 seconds (ref 24–37)

## 2015-10-14 MED ORDER — ONDANSETRON HCL 4 MG/2ML IJ SOLN
4.0000 mg | Freq: Once | INTRAMUSCULAR | Status: AC
  Administered 2015-10-14: 4 mg via INTRAVENOUS

## 2015-10-14 MED ORDER — ONDANSETRON HCL 4 MG/2ML IJ SOLN: 4.0000 mg | Freq: Once | INTRAMUSCULAR | Status: DC

## 2015-10-14 MED ORDER — LABETALOL HCL 5 MG/ML IV SOLN
10.0000 mg | Freq: Once | INTRAVENOUS | Status: AC
  Administered 2015-10-14: 10 mg via INTRAVENOUS

## 2015-10-14 MED ORDER — INSULIN ASPART 100 UNIT/ML ~~LOC~~ SOLN
0.0000 [IU] | SUBCUTANEOUS | Status: DC
  Administered 2015-10-14: 1 [IU] via SUBCUTANEOUS
  Administered 2015-10-14: 2 [IU] via SUBCUTANEOUS
  Administered 2015-10-14 – 2015-10-15 (×3): 1 [IU] via SUBCUTANEOUS
  Administered 2015-10-15 (×2): 2 [IU] via SUBCUTANEOUS

## 2015-10-14 MED ORDER — SODIUM CHLORIDE 0.9 % IV SOLN
8.0000 mg | Freq: Once | INTRAVENOUS | Status: DC
Start: 2015-10-14 — End: 2015-10-14
  Filled 2015-10-14: qty 4

## 2015-10-14 MED ORDER — ANTICOAGULANT SODIUM CITRATE 4% (200MG/5ML) IV SOLN
5.0000 mL | Freq: Once | Status: AC
  Administered 2015-10-19: 3.8 mL via INTRAVENOUS
  Filled 2015-10-14: qty 250

## 2015-10-14 MED ORDER — ONDANSETRON HCL 4 MG/2ML IJ SOLN
INTRAMUSCULAR | Status: AC
  Administered 2015-10-14: 4 mg via INTRAVENOUS
  Filled 2015-10-14: qty 4

## 2015-10-14 MED ORDER — PROMETHAZINE HCL 25 MG/ML IJ SOLN
12.5000 mg | Freq: Three times a day (TID) | INTRAMUSCULAR | Status: DC | PRN
  Administered 2015-10-14 – 2015-10-22 (×3): 12.5 mg via INTRAVENOUS
  Filled 2015-10-14 (×2): qty 1

## 2015-10-14 MED ORDER — PROMETHAZINE HCL 25 MG/ML IJ SOLN
INTRAMUSCULAR | Status: AC
  Filled 2015-10-14: qty 1

## 2015-10-14 MED ORDER — PANTOPRAZOLE SODIUM 40 MG IV SOLR
40.0000 mg | INTRAVENOUS | Status: DC
  Administered 2015-10-14: 40 mg via INTRAVENOUS
  Filled 2015-10-14 (×2): qty 40

## 2015-10-14 MED ORDER — LABETALOL HCL 5 MG/ML IV SOLN
INTRAVENOUS | Status: AC
  Administered 2015-10-14: 10 mg via INTRAVENOUS
  Filled 2015-10-14: qty 4

## 2015-10-14 NOTE — Procedures (Signed)
I have personally attended this patient's dialysis session. 2K bath 2-3 liter goal No heparin (allergy)    Jamal Maes, MD [facility] [phone] Pager 10/14/2015, 1:19 PM

## 2015-10-14 NOTE — Progress Notes (Signed)
[facility] Rounding Note  Subjective:  Required placement of fem HD cath and acute HD last PM for volume, hyperkalemia, worsening renal function Net UF of 3.5 liters Off BIPAP now and on nasal cannula (had marked CO2 retention/resp acidosis last PM) Still not quite at baseline mentally but better Gross hematuria d/t anchoring of catheter - "pulling"   Physical Exam:  BP 131/36 mmHg  Pulse 98  Temp(Src) 98.2 F (36.8 C) (Oral)  Resp 13  Ht 6' (1.829 m)  Wt 145.4 kg (320 lb 8.8 oz)  BMI 43.46 kg/m2  SpO2 98%  GEN: NAD. Knows me. Says "what has happened to me?" Oriented to person and place EYES: EOMI CV: Tachy S1S2 No S3 Lungs ant fairly clear but reduced BS ABD: + abd wall edema. Allograft NT. Bruising. EXT:No RLE edema,. 1+ LLE RUE BVT AVF: +B/T (09/27/15) Right fem HD cath with dressing in place  Scheduled Meds: . anticoagulant sodium citrate  5 mL Intravenous NOW  . aspirin EC  81 mg Oral Daily  . calcitRIOL  1.5 mcg Oral Daily  . Chlorhexidine Gluconate Cloth  6 each Topical Daily  . cloNIDine  0.3 mg Transdermal Weekly  . febuxostat  80 mg Oral Daily  . insulin aspart  0-9 Units Subcutaneous 6 times per day  . mupirocin ointment  1 application Nasal BID  . mycophenolate  750 mg Oral BID  . pantoprazole (PROTONIX) IV  40 mg Intravenous Q24H  . polyethylene glycol  17 g Oral BID  . predniSONE  5 mg Oral Q breakfast  . rosuvastatin  20 mg Oral QHS  . sodium chloride  3 mL Intravenous Q12H  . tacrolimus  1 mg Oral BID  . warfarin  4 mg Oral ONCE-1800  . Warfarin - Pharmacist Dosing Inpatient   Does not apply q1800      PRN Meds:.sodium chloride, sodium chloride, sodium chloride, acetaminophen **OR** acetaminophen, alteplase, anticoagulant sodium citrate, bisacodyl, hydrALAZINE, ondansetron **OR** ondansetron (ZOFRAN) IV, sodium chloride, technetium TC 37M diethylenetriame-pentaacetic acid    Recent Labs Lab 10/09/15 0710  10/13/15 0214  10/13/15 1441 10/14/15 0500  NA 140  < > 140 140 142  K 5.4*  < > 5.8* 6.0* 5.0  CL 106  < > 106 107 106  CO2 24  < > 23 21* 26  GLUCOSE 370*  < > 411* 262* 166*  BUN 80*  < > 106* 111* 73*  CREATININE 4.39*  < > 5.32* 5.49* 4.19*  CALCIUM 7.6*  < > 7.1* 6.9* 7.3*  PHOS 6.5*  --   --   --   --   < > = values in this interval not displayed.  Recent Labs Lab 10/12/15 0308 10/13/15 0214 10/14/15 0500  WBC 5.9 7.1 6.9  NEUTROABS  --   --  5.8  HGB 9.6* 9.8* 9.2*  HCT 32.5* 33.5* 31.4*  MCV 92.6 93.8 91.5  PLT 239 267 233    Background: 75M with CKD4T (failing renal allograft d/t CAN/recent ACR with baseline creatinine PTA around 3.6, progressive), admit with acute LLE DVT . Renal function worsened with diuresis, continued to worsen with hyperkalemia, resp fx despite holding diuretics for 2 days, requiring acute HD 1/1 d/t vol overload, hyperkalemia, hypercarbic resp fx.   ASSESSMENT/RECOMMENDATIONS  1. Progressive CKD4T with AKI, ddKT 2009 [facility], now with CAN, recent ACR (tx with IVIG and pulse steroids); ? AKI 2/2 diuersis, worsened 10/12/14 with hyperkalemia,. Required HD 1/1 via temp fem cath. K down  to 5. Creatinine  4.19, BUN 70's after HD. UOP marginal. Events of this admission may be the "straw that broke the camel's back" - GFR was marginal to start. He may well remain dialysis dependent - too early to say. Will HD again today. Would favor holding coumadin as TDC may be needed over the next several days depending on the course of his AKI on CKD4T. HD again today. 2. Chronic IS on Tac/MMF/Pred; Fluc/Ganciclovir (TMP/SMX held). TAC was empirically reduced from 2.5mg  BID to 1mg  BID on 1/1, ? If having nephrotoxicity (doubt neurotoxicity - I think that as hypercarbia + uremia), tac level pending 3. Acute LLE DVT on Argatroban and transitioning to warfarin; VQ negative. See above discussion - please hold coumadin as may need TDC placement this week 4. AMS - uremic + hypercarbic resp  failure. Improved this AM though still not quite at baseline 5. 2HPTH on VDRA 6. Porcine Allergy 7. Anemia, normocytic; stable 8. CHF 9. COPD 10. DM 11. Aortic Insufficiency hx/o Aortic IE 11/2014 Enteroccoccus  faecalis 12. Gout on febuxostat 13. Hyperkalemia - improved post HD but K still up some 14. S/p R BVT 09/27/2015 +B/T 15. Hx/o urethral stricture 16. Gross hematuria - new since 5AM - from position of catheter stabilizer - pulling on catheter  Jamal Maes, MD [place] Pager 10/14/2015, 10:25 AM

## 2015-10-14 NOTE — Progress Notes (Signed)
Hemodialysis- pt with unrelieved nausea-received zofran one hour prior to transport to HD. Given order for 12.5mg  phenergan iv in HD per NP. Given during dialysis. Continue to monitor patient.

## 2015-10-14 NOTE — Progress Notes (Addendum)
Just prior to hemo picking up patient, patient began coughing forcefully, dry heaving and vomiting. Emesis is green in color. Under 50cc's. Zofran given recently. Again vital signs stable. Patient being transported to dialysis now. Will continue to monitor.

## 2015-10-14 NOTE — Progress Notes (Signed)
Patient is currently resting comfortably on 6L nasal cannula.  Will continue to monitor for Bipap needs.

## 2015-10-14 NOTE — Progress Notes (Addendum)
Hemodialysis- with 30 minutes remaining on treatment pt had another episode nausea/vomiting. Order to DC treatment per Dr. Lorrene Reid who was at bedside. Notified RN on 71M- pt had orders for transfer to [place] however n/v continues to worsen. Will transfer back to [facility]M. Report called. Pt currently sleeping after vomiting approximatly 200cc.

## 2015-10-14 NOTE — Progress Notes (Signed)
4mg  zofran given as patient started dry heaving and feeling nauseous. All vital signs stable. MD aware.

## 2015-10-14 NOTE — Progress Notes (Signed)
Hemodialysis- continues to have nausea/vomiting without relief. Dr. Lorrene Reid paged. Order to give 4mg  zofran x2 (8mg  total) doses and call if unrelieved. Continue to monitor patient.

## 2015-10-14 NOTE — Progress Notes (Signed)
eLink Physician-Brief Progress Note Patient Name: [name] [name] DOB: 1965-11-29 MRN: [id number]   Date of Service  10/14/2015  HPI/Events of Note  Patient NPO and has N/V. Currently on AC/HS Novolog SSI and Lantus. Blood glucose = 107. Also on Protonix PO.  eICU Interventions  Will order: 1. D/C Lantus. 2. Q 4 hour Accuchecks and sensitive SSI coverage. 3. Change Protonix PO to Protonix IV.      Intervention Category Intermediate Interventions: Hyperglycemia - evaluation and treatment  [name] 10/14/2015, 12:06 AM

## 2015-10-14 NOTE — Consult Note (Signed)
PULMONARY / CRITICAL CARE MEDICINE   Name: [name] [name] MRN: [id number] DOB: [birth year]    ADMISSION DATE:  10/07/2015 CONSULTATION DATE: 10/13/2015  REFERRING MD:  Letta Median  CHIEF COMPLAINT:  Altered mental status  HISTORY OF PRESENT ILLNESS:   50 yo male presented with shortness of breath.  He has hx of renal transplant and has progressive renal failure.  He had dialysis fistula placed on 09/28/15.  He has been getting more swelling in his legs.  He was found to have DVT in Lt popliteal vein.  He has hx of heparin allergy.  He was started on argatroban.  Renal was consulted.  He was tried on lasix.  He has hx of OSA.  He was started on coumadin.  He was getting scheduled doses of valium.  He was noted be more confused and sleepy on 10/13/15.  ABG showed acute hypercapnia.  He was transferred to SDU and PCCM consulted.  He was started on BiPAP.  RN notes mental status improving since starting BiPAP.  SUBJECTIVE:  No bipap, no distress, awakens  VITAL SIGNS: BP 179/54 mmHg  Pulse 103  Temp(Src) 98.5 F (36.9 C) (Oral)  Resp 18  Ht 6' (1.829 m)  Wt 145.4 kg (320 lb 8.8 oz)  BMI 43.46 kg/m2  SpO2 98%  INTAKE / OUTPUT: I/O last 3 completed shifts: In: 1359.2 [P.O.:1160; I.V.:199.2] Out: [id number]; Emesis/NG output:100; Other:3000]  PHYSICAL EXAMINATION: General:  Sleepy but wake sup fine Neuro:  Wakes up easily, follows simple commands, moving all extremities HEENT:  Pupils reactive Cardiovascular:  Regular, 2/6 SM Lungs: reduced Abdomen:  Obese, soft, non tender Musculoskeletal:  1+ edema Skin:  No rashes  LABS:  BMET  Recent Labs Lab 10/13/15 0214 10/13/15 1441 10/14/15 0500  NA 140 140 142  K 5.8* 6.0* 5.0  CL 106 107 106  CO2 23 21* 26  BUN 106* 111* 73*  CREATININE 5.32* 5.49* 4.19*  GLUCOSE 411* 262* 166*    Electrolytes  Recent Labs Lab 10/09/15 0710  10/13/15 0214 10/13/15 1441 10/14/15 0500  CALCIUM 7.6*  < > 7.1* 6.9* 7.3*  PHOS 6.5*   --   --   --   --   < > = values in this interval not displayed.  CBC  Recent Labs Lab 10/12/15 0308 10/13/15 0214 10/14/15 0500  WBC 5.9 7.1 6.9  HGB 9.6* 9.8* 9.2*  HCT 32.5* 33.5* 31.4*  PLT 239 267 233    Coag's  Recent Labs Lab 10/12/15 0636 10/13/15 0214 10/13/15 1441 10/14/15 0659  APTT 78* 85*  --  31  INR 3.93* 6.05* 3.15* 2.31*    Sepsis Markers No results for input(s): LATICACIDVEN, PROCALCITON, O2SATVEN in the last 168 hours.  ABG  Recent Labs Lab 10/13/15 1025 10/13/15 1420  PHART 7.181* 7.185*  PCO2ART 61.0* 62.0*  PO2ART 73.5* 89.7    Liver Enzymes  Recent Labs Lab 10/09/15 0710  ALBUMIN 2.8*    Cardiac Enzymes  Recent Labs Lab 10/10/15 0527 10/10/15 1130 10/10/15 1740  TROPONINI 0.10* 0.09* 0.09*    Glucose  Recent Labs Lab 10/13/15 1535 10/13/15 1642 10/13/15 1918 10/13/15 2311 10/14/15 0347 10/14/15 0836  GLUCAP 214* 186* 137* 107* 129* 136*    Imaging US Renal Transplant W/doppler  10/14/2015  CLINICAL DATA:  Progressive chronic kidney disease requiring hemodialysis. Status post renal transplant in 2009. EXAM: ULTRASOUND OF RENAL TRANSPLANT WITH DOPPLER TECHNIQUE: Ultrasound examination of the renal transplant was performed with gray-scale, color and duplex doppler evaluation.  COMPARISON:  None. FINDINGS: Transplant kidney location:  Right lower quadrant. Transplant kidney: Length: 11.5 cm. Morphology of the renal transplant appears abnormal with somewhat poor delineation of contours and increase in cortical echogenicity. Part of the difficulty in delineate contours is felt to be due to some adjacent bowel gas. There may be mild prominence of the collecting system in one pole without overt hydronephrosis. Color flow in the main renal artery:  Present Color flow in the main renal vein:  Present Duplex Doppler Evaluation Resistive index in main renal artery: 1.0. Waveform appears to be of high resistance which may be  reflective of transplant rejection. Venous waveform in main renal vein:  Present Resistive index in upper pole intrarenal artery:  0.9 (normal 0.6-0.8; equivocal 0.8-0.9; abnormal > 0.9) Resistive index in lower pole intrarenal artery: 0.8 (normal 0.6-0.8; equivocal 0.8-0.9; abnormal > 0.9) Bladder: Decompressed by Foley catheter. Other findings: No peri-transplant fluid collections identified by ultrasound. IMPRESSION: Abnormal sonographic appearance of the right lower quadrant renal transplant with poor delineation of contours and increased cortical echogenicity. Mild prominence of the collecting system in one pole without overt hydronephrosis. Arterial resistive indices are elevated or borderline with waveforms consistent with high resistance pattern. These findings are suggestive of transplant rejection. Electronically Signed   By: Aletta Edouard M.D.   On: 10/14/2015 09:37   Dg Chest Port 1 View  10/14/2015  CLINICAL DATA:  50 year old male with history of respiratory failure. EXAM: PORTABLE CHEST 1 VIEW COMPARISON:  Chest x-ray 10/13/2015. FINDINGS: Low lung volumes. Bibasilar opacities favored to predominantly reflect atelectasis. Moderate pulmonary edema. Small bilateral pleural effusions. Mild cardiomegaly. Upper mediastinal contours are distorted by patient's positioning and rotation to the right. IMPRESSION: 1. The appearance the chest suggests congestive heart failure, as above. Electronically Signed   By: Vinnie Langton M.D.   On: 10/14/2015 07:57     STUDIES:  12/26 Doppler legs >> acute DVT Lt popliteal vein 12/27 V/Q scan >> no PE 01/01 CT head >> no acute findings  CULTURES:  ANTIBIOTICS:  SIGNIFICANT EVENTS: 12/26 Admit, start argatroban 12/27 renal consulted 01/01 Transfer to SDU, start BiPAP 1/2- improved resp status, off BIPAP  LINES/TUBES:  DISCUSSION: 50 yo male with hx of renal transplant and progressive renal failure found to have Lt leg DVT and progressive dyspnea  due to volume overload.  He was transferred to ICU with altered mental status and acute hypoxic/hypercapnic respiratory failure.  He was getting scheduled valium.  Mental status and respiratory status show improvement with BiPAP.   ASSESSMENT / PLAN:  Acute hypoxic, hypercapnic respiratory failure. Hx of OSA. pulm edema Plan: - dc BiPAP - oxygen to keep SpO2 90 to 95% - consider repeat HD for NEG balance -pcxr to see if edema resolves  Acute encephalopathy 2nd to respiratory failure - resolved Plan: - improved, monitor on floors - avoid benzo's, narcotics  CKD stage V s/p renal transplant 2009. Hypervolemia. Acute on chronic diastolic CHF. Plan: - per renal - to repeat HD for neg balance  Acute Lt leg DVT. Plan: - anticoagulation per primary team, holding coumadin for tunneled cath   To triad again, transfer  Lavon Paganini. Titus Mould, MD, [place] Pgr: [place] [facility]

## 2015-10-15 ENCOUNTER — Inpatient Hospital Stay (HOSPITAL_COMMUNITY): Payer: Medicare Other

## 2015-10-15 DIAGNOSIS — I509 Heart failure, unspecified: Secondary | ICD-10-CM

## 2015-10-15 LAB — CBC
HCT: 21.7 % — ABNORMAL LOW (ref 39.0–52.0)
HCT: 28.9 % — ABNORMAL LOW (ref 39.0–52.0)
Hemoglobin: 6.6 g/dL — CL (ref 13.0–17.0)
Hemoglobin: 8.6 g/dL — ABNORMAL LOW (ref 13.0–17.0)
MCH: 27.4 pg (ref 26.0–34.0)
MCH: 27 pg (ref 26.0–34.0)
MCHC: 30.4 g/dL (ref 30.0–36.0)
MCHC: 29.8 g/dL — ABNORMAL LOW (ref 30.0–36.0)
MCV: 90 fL (ref 78.0–100.0)
MCV: 90.9 fL (ref 78.0–100.0)
Platelets: 150 10*3/uL (ref 150–400)
Platelets: 197 K/uL (ref 150–400)
RBC: 2.41 MIL/uL — ABNORMAL LOW (ref 4.22–5.81)
RBC: 3.18 MIL/uL — ABNORMAL LOW (ref 4.22–5.81)
RDW: 15 % (ref 11.5–15.5)
RDW: 15.2 % (ref 11.5–15.5)
WBC: 5.2 10*3/uL (ref 4.0–10.5)
WBC: 6.4 K/uL (ref 4.0–10.5)

## 2015-10-15 LAB — ABO/RH: ABO/RH(D): A POS

## 2015-10-15 LAB — GLUCOSE, CAPILLARY
Glucose-Capillary: 144 mg/dL — ABNORMAL HIGH (ref 65–99)
Glucose-Capillary: 134 mg/dL — ABNORMAL HIGH (ref 65–99)
Glucose-Capillary: 164 mg/dL — ABNORMAL HIGH (ref 65–99)
Glucose-Capillary: 202 mg/dL — ABNORMAL HIGH (ref 65–99)
Glucose-Capillary: 280 mg/dL — ABNORMAL HIGH (ref 65–99)

## 2015-10-15 LAB — HEPATITIS B SURFACE ANTIGEN: Hepatitis B Surface Ag: NEGATIVE

## 2015-10-15 LAB — COMPREHENSIVE METABOLIC PANEL
ALT: 11 U/L — ABNORMAL LOW (ref 17–63)
AST: 15 U/L (ref 15–41)
Albumin: 2.5 g/dL — ABNORMAL LOW (ref 3.5–5.0)
Alkaline Phosphatase: 81 U/L (ref 38–126)
Anion gap: 10 (ref 5–15)
BUN: 51 mg/dL — ABNORMAL HIGH (ref 6–20)
CO2: 27 mmol/L (ref 22–32)
Calcium: 7.5 mg/dL — ABNORMAL LOW (ref 8.9–10.3)
Chloride: 104 mmol/L (ref 101–111)
Creatinine, Ser: 3.54 mg/dL — ABNORMAL HIGH (ref 0.61–1.24)
GFR calc Af Amer: 21 mL/min — ABNORMAL LOW (ref >60)
GFR calc non Af Amer: 19 mL/min — ABNORMAL LOW (ref >60)
Glucose, Bld: 159 mg/dL — ABNORMAL HIGH (ref 65–99)
Potassium: 4.8 mmol/L (ref 3.5–5.1)
Sodium: 141 mmol/L (ref 135–145)
Total Bilirubin: 1.2 mg/dL (ref 0.3–1.2)
Total Protein: 4.6 g/dL — ABNORMAL LOW (ref 6.5–8.1)

## 2015-10-15 LAB — PROTIME-INR
INR: 2.46 — ABNORMAL HIGH (ref 0.00–1.49)
Prothrombin Time: 26.4 seconds — ABNORMAL HIGH (ref 11.6–15.2)

## 2015-10-15 LAB — TYPE AND SCREEN
ABO/RH(D): A POS
Antibody Screen: NEGATIVE

## 2015-10-15 LAB — APTT: aPTT: 42 seconds — ABNORMAL HIGH (ref 24–37)

## 2015-10-15 LAB — PHOSPHORUS: Phosphorus: 5.2 mg/dL — ABNORMAL HIGH (ref 2.5–4.6)

## 2015-10-15 MED ORDER — INSULIN ASPART 100 UNIT/ML ~~LOC~~ SOLN
0.0000 [IU] | Freq: Three times a day (TID) | SUBCUTANEOUS | Status: DC
  Administered 2015-10-15 – 2015-10-16 (×2): 3 [IU] via SUBCUTANEOUS
  Administered 2015-10-16 – 2015-10-17 (×2): 5 [IU] via SUBCUTANEOUS
  Administered 2015-10-17: 3 [IU] via SUBCUTANEOUS

## 2015-10-15 MED ORDER — LABETALOL HCL 200 MG PO TABS
200.0000 mg | ORAL_TABLET | Freq: Three times a day (TID) | ORAL | Status: DC
Start: 2015-10-15 — End: 2015-10-15
  Administered 2015-10-15: 200 mg via ORAL
  Filled 2015-10-15 (×3): qty 1

## 2015-10-15 MED ORDER — INSULIN ASPART 100 UNIT/ML ~~LOC~~ SOLN
0.0000 [IU] | Freq: Every day | SUBCUTANEOUS | Status: DC
  Administered 2015-10-15 – 2015-10-16 (×2): 3 [IU] via SUBCUTANEOUS
  Administered 2015-10-18: 5 [IU] via SUBCUTANEOUS
  Administered 2015-10-18: 4 [IU] via SUBCUTANEOUS
  Administered 2015-10-23: 3 [IU] via SUBCUTANEOUS

## 2015-10-15 MED ORDER — LABETALOL HCL 5 MG/ML IV SOLN
10.0000 mg | INTRAVENOUS | Status: DC | PRN
Start: 2015-10-15 — End: 2015-10-26
  Administered 2015-10-15: 10 mg via INTRAVENOUS
  Filled 2015-10-15 (×2): qty 4

## 2015-10-15 MED ORDER — AMLODIPINE BESYLATE 10 MG PO TABS: 10.0000 mg | ORAL_TABLET | Freq: Every day | ORAL | Status: DC

## 2015-10-15 MED ORDER — PANTOPRAZOLE SODIUM 40 MG PO TBEC
40.0000 mg | DELAYED_RELEASE_TABLET | Freq: Every day | ORAL | Status: DC
Start: 2015-10-15 — End: 2015-10-26
  Administered 2015-10-15 – 2015-10-25 (×11): 40 mg via ORAL
  Filled 2015-10-15 (×11): qty 1

## 2015-10-15 MED ORDER — LABETALOL HCL 100 MG PO TABS
100.0000 mg | ORAL_TABLET | Freq: Three times a day (TID) | ORAL | Status: DC
Start: 2015-10-15 — End: 2015-10-26
  Administered 2015-10-15 – 2015-10-26 (×26): 100 mg via ORAL
  Filled 2015-10-15 (×31): qty 1

## 2015-10-15 MED ORDER — AMLODIPINE BESYLATE 10 MG PO TABS
10.0000 mg | ORAL_TABLET | Freq: Every day | ORAL | Status: DC
  Filled 2015-10-15: qty 1

## 2015-10-15 NOTE — Progress Notes (Signed)
[place] Progress Note Patient Name: Kadian Kennerson DOB: [birth year] MRN: [id number]   Date of Service  10/15/2015  HPI/Events of Note  Significant drop in hgb am CBC;  STAT repeat with T&S  eICU Interventions       Intervention Category Minor Interventions: Other:  Cherye Gaertner S. 10/15/2015, 4:18 AM

## 2015-10-15 NOTE — Progress Notes (Signed)
   10/15/15 0242  Vent Select  Invasive or Noninvasive Noninvasive  Adult Vent Y  Adult Ventilator Settings  Vent Type Servo i  Vent Mode BIPAP  Set Rate 12 bmp  FiO2 (%) 40 %  Pressure Control 10 cmH20  PEEP 5 cmH20  Adult Ventilator Measurements  Peak Airway Pressure 14 L/min  Mean Airway Pressure 7 cmH20  Resp Rate Spontaneous 4 br/min  Resp Rate Total 16 br/min  Exhaled Vt 546 mL  Measured Ve 11.5 mL  HOB> 30 Degrees Y  Adult Ventilator Alarms  Alarms On Y  Patient placed on BIPAP due to increase AMS.

## 2015-10-15 NOTE — Progress Notes (Signed)
eLink Physician-Brief Progress Note Patient Name: [name] [name] DOB: May 07, 1966 MRN: [id number]   Date of Service  10/15/2015  HPI/Events of Note  Hypertension SBP 190s inspite of hydralazine  eICU Interventions  Labetelol 10 mg IV PRN     Intervention Category Intermediate Interventions: Hypertension - evaluation and management  Rudra Hobbins 10/15/2015, 7:36 AM

## 2015-10-15 NOTE — Progress Notes (Signed)
CRITICAL VALUE ALERT  Critical value received: Hb 6.6  Date of notification: 10/15/2015   Time of notification:  0415  Critical value read back:yes  Nurse who received alert:  Jannifer Franklin, RN  MD notified (1st page):  NA  Time of first page:  NA  MD notified (2nd page):  Time of second page:  Responding MD:  Dr. Lamonte Sakai  Time MD responded:  E-link

## 2015-10-15 NOTE — Progress Notes (Signed)
Received order for one time order for labetalol from Dr. Ardis Hughs due to continued hypertension.  Will continue to monitor.

## 2015-10-15 NOTE — Progress Notes (Signed)
Pt trfd to floor at 1845 from ICU. Pt oriented to room. Telemetry box on. Pt alert and oriented and in no distress. On 4L O2 as per report

## 2015-10-15 NOTE — Progress Notes (Signed)
[facility] Rounding Note  Subjective:  Has required placement of fem HD cath (1/1) with HD 1/1, 1/2 - marked improvement in resp status.  K better BUN and creatinine just reflective of having been dialyzed MS at baseline (though says he can't remember what has happened to him past few days) Main c/o for past 2 days R frontal HA Notably - BP quite elevated   Physical Exam:  BP 195/57 mmHg  Pulse 94  Temp(Src) 98.7 F (37.1 C) (Oral)  Resp 12  Ht 6' (1.829 m)  Wt 130 kg (286 lb 9.6 oz)  BMI 38.86 kg/m2  SpO2 97%  GEN: c/o HA.  CV: Tachy S1S2 No S3 Lungs anteriorly clear ABD: Non-tender R renal allograft. + BS. EXT:No RLE edema,. 1+ LLE RUE BVT AVF: +B/T (09/27/15) Right fem HD cath with dressing in place  Scheduled Meds: . anticoagulant sodium citrate  5 mL Intravenous Once  . aspirin EC  81 mg Oral Daily  . calcitRIOL  1.5 mcg Oral Daily  . Chlorhexidine Gluconate Cloth  6 each Topical Daily  . cloNIDine  0.3 mg Transdermal Weekly  . febuxostat  80 mg Oral Daily  . insulin aspart  0-9 Units Subcutaneous 6 times per day  . mupirocin ointment  1 application Nasal BID  . mycophenolate  750 mg Oral BID  . pantoprazole (PROTONIX) IV  40 mg Intravenous Q24H  . polyethylene glycol  17 g Oral BID  . predniSONE  5 mg Oral Q breakfast  . rosuvastatin  20 mg Oral QHS  . sodium chloride  3 mL Intravenous Q12H  . tacrolimus  1 mg Oral BID      PRN Meds:.sodium chloride, sodium chloride, sodium chloride, acetaminophen **OR** acetaminophen, bisacodyl, hydrALAZINE, labetalol, ondansetron **OR** ondansetron (ZOFRAN) IV, promethazine, sodium chloride, technetium TC 37M diethylenetriame-pentaacetic acid    Recent Labs Lab 10/09/15 0710  10/13/15 1441 10/14/15 0500 10/15/15 0335  NA 140  < > 140 142 141  K 5.4*  < > 6.0* 5.0 4.8  CL 106  < > 107 106 104  CO2 24  < > 21* 26 27  GLUCOSE 370*  < > 262* 166* 159*  BUN 80*  < > 111* 73* 51*  CREATININE 4.39*  < >  5.49* 4.19* 3.54*  CALCIUM 7.6*  < > 6.9* 7.3* 7.5*  PHOS 6.5*  --   --   --  5.2*  < > = values in this interval not displayed.  Recent Labs Lab 10/14/15 0500 10/15/15 0335 10/15/15 0438  WBC 6.9 5.2 6.4  NEUTROABS 5.8  --   --   HGB 9.2* 6.6* 8.6*  HCT 31.4* 21.7* 28.9*  MCV 91.5 90.0 90.9  PLT 233 150 197    10/15/15 CXR IMPRESSION: Improving congestive heart failure. Mild persistent pulmonary edema. Small left pleural effusion cannot be excluded   Background: 47M with CKD4T (failing renal allograft d/t CAN/recent ACR with baseline creatinine PTA around 3.6, progressive), admit with acute LLE DVT . Renal function worsened with diuresis, continued to worsen with hyperkalemia, resp fx despite holding diuretics for 2 days, requiring acute HD (1st 1/1) d/t vol overload, hyperkalemia, hypercarbic resp fx.   ASSESSMENT/RECOMMENDATIONS  1. Progressive CKD4T with AKI, ddKT 2009 [facility], now with CAN, recent ACR (tx with IVIG and pulse steroids); ? AKI 2/2 diuresis, worsened 10/13/15 with hyperkalemia,. Required HD 1/1, 1/2  via temp fem cath. Volume and numbers better. UOP about 600 yesterday but had BEEN making urine (diuresing)  but at expense of renal function. I am thinking that he will remain HD dependent but would like to watch another 24 hours (no HD today). Will try to make a decision in next 24 hours whether or not to proceed with TDC (INR still too high to proceed right now)   2. Chronic IS on Tac/MMF/Pred; Fluc/Ganciclovir (TMP/SMX has been stopped). TAC was empirically reduced from 2.5mg  BID to 1mg  BID on 1/1 (appropriately so as TAC level came back at 12)   3. Hypertension - BP quite high and pt tachy. Just on catapres patch and getting prn hydralazine. Add labetolol orally. (CCM has added amlodipine 10 - give that at hs.  4. Acute LLE DVT on Argatroban and was transitioning to warfarin; VQ negative. See above discussion - continue to hold coumadin as may need TDC placement this  week  5. AMS - uremic + hypercarbic resp failure. He is at his baseline today. 6. 2HPTH on VDRA. Check PTH 7. Porcine Allergy 8. Anemia - Hb came back at 6.6 this AM, repeat was 8.6. Will check Fe studies and start Aranesp. 9. CHF - markedly improved.  10. COPD 11. DM 12. Aortic Insufficiency hx/o Aortic IE 11/2014 Enteroccoccus  faecalis 13. Gout on febuxostat 14. Hyperkalemia - improved post HD X2 15. S/p R BVT 09/27/2015 +B/T 16. Hx/o urethral stricture 17. Gross hematuria - improving. Was due to catheter trauma.   [name] Maes, MD [facility] [phone] Pager 10/15/2015, 8:34 AM

## 2015-10-15 NOTE — Progress Notes (Signed)
Pt removed Bipap and refuses to wear it at this time.  Pt returned to [facility] with Sp02 98%.  Mariah Milling, RRT made aware of pt's refusal.  Will continue to monitor.

## 2015-10-15 NOTE — Care Management Important Message (Signed)
Important Message  Patient Details  Name: [name] [name] MRN: [id number] Date of Birth: 12/02/1965   Medicare Important Message Given:  Yes    [name] [name] 10/15/2015, 11:38 AM

## 2015-10-15 NOTE — Progress Notes (Signed)
PULMONARY / CRITICAL CARE MEDICINE   Name: [name] [name] MRN: [id number] DOB: 1965/11/28    ADMISSION DATE:  10/07/2015 CONSULTATION DATE:  10/13/15  REFERRING MD:  Letta Median  CHIEF COMPLAINT:  AMS   SUBJECTIVE:  BP elevated overnight, mental status improving. Complaining of a headache this AM.   VITAL SIGNS: BP 187/59 mmHg  Pulse 105  Temp(Src) 99.3 F (37.4 C) (Oral)  Resp 21  Ht 6' (1.829 m)  Wt 286 lb 9.6 oz (130 kg)  BMI 38.86 kg/m2  SpO2 97%  HEMODYNAMICS:    VENTILATOR SETTINGS:  INTAKE / OUTPUT: I/O last 3 completed shifts: In: 51 [I.V.:33] Out: [id number] [Urine:825; Emesis/NG output:100; Other:4646]  PHYSICAL EXAMINATION: General: Awake, alert, NAD.  Neuro: No focal neuro deficits, follows simple commands, moving all extremities. A&O x2 (not oriented to place) HEENT: Pupils constricted, reactive Cardiovascular: Regular, 2/6 SM Lungs: Air entry equal bilaterally, faint scattered crackles.  Abdomen: Obese, soft, non tender. Right Fem HD catheter. Maturing RUE AVF with good thrill/bruit Musculoskeletal: Trace pitting edema Skin: No rashes  LABS:  BMET  Recent Labs Lab 10/13/15 1441 10/14/15 0500 10/15/15 0335  NA 140 142 141  K 6.0* 5.0 4.8  CL 107 106 104  CO2 21* 26 27  BUN 111* 73* 51*  CREATININE 5.49* 4.19* 3.54*  GLUCOSE 262* 166* 159*    Electrolytes  Recent Labs Lab 10/09/15 0710  10/13/15 1441 10/14/15 0500 10/15/15 0335  CALCIUM 7.6*  < > 6.9* 7.3* 7.5*  PHOS 6.5*  --   --   --  5.2*  < > = values in this interval not displayed.  CBC  Recent Labs Lab 10/14/15 0500 10/15/15 0335 10/15/15 0438  WBC 6.9 5.2 6.4  HGB 9.2* 6.6* 8.6*  HCT 31.4* 21.7* 28.9*  PLT 233 150 197    Coag's  Recent Labs Lab 10/13/15 0214 10/13/15 1441 10/14/15 0659 10/15/15 0335  APTT 85*  --  31 42*  INR 6.05* 3.15* 2.31* 2.46*    ABG  Recent Labs Lab 10/13/15 1025 10/13/15 1420 10/14/15 1821  PHART 7.181* 7.185* 7.301*   PCO2ART 61.0* 62.0* 55.1*  PO2ART 73.5* 89.7 100.0    Liver Enzymes  Recent Labs Lab 10/09/15 0710 10/15/15 0335  AST  --  15  ALT  --  11*  ALKPHOS  --  81  BILITOT  --  1.2  ALBUMIN 2.8* 2.5*    Cardiac Enzymes  Recent Labs Lab 10/10/15 0527 10/10/15 1130 10/10/15 1740  TROPONINI 0.10* 0.09* 0.09*    Glucose  Recent Labs Lab 10/14/15 0836 10/14/15 1127 10/14/15 1725 10/14/15 1946 10/14/15 2346 10/15/15 0314  GLUCAP 136* 168* 113* 116* 132* 144*    Imaging US Renal Transplant W/doppler  10/14/2015  CLINICAL DATA:  Progressive chronic kidney disease requiring hemodialysis. Status post renal transplant in 2009. EXAM: ULTRASOUND OF RENAL TRANSPLANT WITH DOPPLER TECHNIQUE: Ultrasound examination of the renal transplant was performed with gray-scale, color and duplex doppler evaluation. COMPARISON:  None. FINDINGS: Transplant kidney location:  Right lower quadrant. Transplant kidney: Length: 11.5 cm. Morphology of the renal transplant appears abnormal with somewhat poor delineation of contours and increase in cortical echogenicity. Part of the difficulty in delineate contours is felt to be due to some adjacent bowel gas. There may be mild prominence of the collecting system in one pole without overt hydronephrosis. Color flow in the main renal artery:  Present Color flow in the main renal vein:  Present Duplex Doppler Evaluation Resistive index in main renal  artery: 1.0. Waveform appears to be of high resistance which may be reflective of transplant rejection. Venous waveform in main renal vein:  Present Resistive index in upper pole intrarenal artery:  0.9 (normal 0.6-0.8; equivocal 0.8-0.9; abnormal > 0.9) Resistive index in lower pole intrarenal artery: 0.8 (normal 0.6-0.8; equivocal 0.8-0.9; abnormal > 0.9) Bladder: Decompressed by Foley catheter. Other findings: No peri-transplant fluid collections identified by ultrasound. IMPRESSION: Abnormal sonographic appearance of  the right lower quadrant renal transplant with poor delineation of contours and increased cortical echogenicity. Mild prominence of the collecting system in one pole without overt hydronephrosis. Arterial resistive indices are elevated or borderline with waveforms consistent with high resistance pattern. These findings are suggestive of transplant rejection. Electronically Signed   By: Aletta Edouard M.D.   On: 10/14/2015 09:37   Dg Chest Port 1 View  10/15/2015  CLINICAL DATA:  Pulmonary edema. EXAM: PORTABLE CHEST 1 VIEW COMPARISON:  10/14/2015. FINDINGS: Mediastinum hilar structures stable. Cardiomegaly with pulmonary venous congestion. Interim improvement of bilateral pulmonary infiltrates/ edema. Small left pleural effusion cannot be excluded. No pneumothorax. IMPRESSION: Improving congestive heart failure. Mild persistent pulmonary edema. Small left pleural effusion cannot be excluded. Electronically Signed   By: [place]   On: 10/15/2015 07:30   Dg Abd Portable 1v  10/14/2015  CLINICAL DATA:  Vomiting.  Possible small bowel obstruction. EXAM: PORTABLE ABDOMEN - 1 VIEW COMPARISON:  None. FINDINGS: Two supine views. Degraded exam secondary to patient body habitus. Right femoral line. No bowel distension. Left-sided colonic stool and gas. IMPRESSION: Limited exam, demonstrating no evidence of bowel obstruction. Electronically Signed   By: Abigail Miyamoto M.D.   On: 10/14/2015 18:54    STUDIES:  12/26 Doppler legs >> acute DVT Lt popliteal vein 12/27 V/Q scan >> no PE 01/01 CT head >> no acute findings  CULTURES:  ANTIBIOTICS:  SIGNIFICANT EVENTS: 12/26 Admit, start argatroban 12/27 renal consulted 01/01 Transfer to SDU, start BiPAP 1/2- improved resp status, off BIPAP 1/2- refused NGT, vomiting, HTN  LINES/TUBES: Right Femoral HD catheter  DISCUSSION: 50 yo male with hx of renal transplant and progressive renal failure found to have Lt leg DVT and progressive dyspnea due to  volume overload. He was transferred to ICU with altered mental status and acute hypoxic/hypercapnic respiratory failure. He was getting scheduled valium. Mental status and respiratory status show improvement with BiPAP.   ASSESSMENT / PLAN:  PULMONARY A: Acute Hypercarbic respiratory failure; resolved H/o OSA Pulmonary Edema; improved P:   Supplemental O2; SpO2 90-95% HD  CARDIOVASCULAR A:  HTN  Hypervolemia P:  Clonidine 0.3 mg patch Add Labetalol 200 mg tid Norvasc 10 mg qhs Hydralazine prn ASA HD per renal  RENAL A:   S/p Renal Transplant Gout S/p RUE AVF placement 09/27/15 Hyperkalemia; resolved P:   Continue Prograf, Cellcept, Prednisone HD per nephrology Continue Uloric Tunneled HD cath placement per renal  GASTROINTESTINAL A:   GERD Nausea, vomiting resolved as we treated BP P:   Protonix Phenergan prn  HEMATOLOGIC A:   Anemia 2/2 Renal Disease RLE DVT P:  CBC in AM Hold Coumadin for now for possible tunneled cath placement  INFECTIOUS A:   No issues P:   CBC in AM  ENDOCRINE A:   DM type II   P:   ISS q4h  NEUROLOGIC A:   Acute Encephalopathy; improved P:   Avoid benzos    Transfer to [facility] 10/15/14  Natasha Bence, MD PGY-3, Internal Medicine Pager: [phone]   10/15/2015, 7:46  AM   STAFF NOTE: I, Merrie Roof, MD FACP have personally reviewed patient's available data, including medical history, events of note, physical examination and test results as part of my evaluation. I have discussed with resident/NP and other care providers such as pharmacist, RN and RRT. In addition, I personally evaluated patient and elicited key findings of: no distress, HTN controlled, no vomiting further, to floor, controlling BP more effective, do not dc clonidine, refusing some therapy, triad to pick up in am , neg balance when on hd, no BIPAP needs, conservative with benzo  Lavon Paganini. Titus Mould, MD, [place] P[facility]: [facility] 10/15/2015 2:10 PM

## 2015-10-16 LAB — RENAL FUNCTION PANEL
Albumin: 2.5 g/dL — ABNORMAL LOW (ref 3.5–5.0)
Anion gap: 12 (ref 5–15)
BUN: 64 mg/dL — ABNORMAL HIGH (ref 6–20)
CO2: 26 mmol/L (ref 22–32)
Calcium: 7.8 mg/dL — ABNORMAL LOW (ref 8.9–10.3)
Chloride: 102 mmol/L (ref 101–111)
Creatinine, Ser: 4.74 mg/dL — ABNORMAL HIGH (ref 0.61–1.24)
GFR calc Af Amer: 15 mL/min — ABNORMAL LOW (ref >60)
GFR calc non Af Amer: 13 mL/min — ABNORMAL LOW (ref >60)
Glucose, Bld: 233 mg/dL — ABNORMAL HIGH (ref 65–99)
Phosphorus: 4.9 mg/dL — ABNORMAL HIGH (ref 2.5–4.6)
Potassium: 4.9 mmol/L (ref 3.5–5.1)
Sodium: 140 mmol/L (ref 135–145)

## 2015-10-16 LAB — IRON AND TIBC
Iron: 6 ug/dL — ABNORMAL LOW (ref 45–182)
Saturation Ratios: 3 % — ABNORMAL LOW (ref 17.9–39.5)
TIBC: 224 ug/dL — ABNORMAL LOW (ref 250–450)
UIBC: 218 ug/dL

## 2015-10-16 LAB — CBC
HCT: 32 % — ABNORMAL LOW (ref 39.0–52.0)
Hemoglobin: 9.6 g/dL — ABNORMAL LOW (ref 13.0–17.0)
MCH: 27 pg (ref 26.0–34.0)
MCHC: 30 g/dL (ref 30.0–36.0)
MCV: 89.9 fL (ref 78.0–100.0)
Platelets: 216 10*3/uL (ref 150–400)
RBC: 3.56 MIL/uL — ABNORMAL LOW (ref 4.22–5.81)
RDW: 15 % (ref 11.5–15.5)
WBC: 14.7 10*3/uL — ABNORMAL HIGH (ref 4.0–10.5)

## 2015-10-16 LAB — PROTIME-INR
INR: 2.42 — ABNORMAL HIGH (ref 0.00–1.49)
Prothrombin Time: 26.1 seconds — ABNORMAL HIGH (ref 11.6–15.2)

## 2015-10-16 LAB — GLUCOSE, CAPILLARY
Glucose-Capillary: 208 mg/dL — ABNORMAL HIGH (ref 65–99)
Glucose-Capillary: 262 mg/dL — ABNORMAL HIGH (ref 65–99)
Glucose-Capillary: 291 mg/dL — ABNORMAL HIGH (ref 65–99)

## 2015-10-16 LAB — APTT: aPTT: 51 seconds — ABNORMAL HIGH (ref 24–37)

## 2015-10-16 MED ORDER — SODIUM CHLORIDE 0.9 % IV SOLN
2500.0000 mg | Freq: Once | INTRAVENOUS | Status: AC
  Administered 2015-10-16: 2500 mg via INTRAVENOUS
  Filled 2015-10-16: qty 2500

## 2015-10-16 MED ORDER — PIPERACILLIN-TAZOBACTAM IN DEX 2-0.25 GM/50ML IV SOLN
2.2500 g | Freq: Three times a day (TID) | INTRAVENOUS | Status: DC
  Administered 2015-10-16 – 2015-10-20 (×12): 2.25 g via INTRAVENOUS
  Filled 2015-10-16 (×14): qty 50

## 2015-10-16 NOTE — Procedures (Signed)
I have personally attended this patient's dialysis session.  Using R fem temp cath with plans to remove post HD due to fever 2K bath No heparin 2 liter goal today Blood cultures not drawn yet - get in HD  Jamal Maes, MD [place] Pager 10/16/2015, 2:12 PM

## 2015-10-16 NOTE — Progress Notes (Signed)
ANTIBIOTIC CONSULT NOTE - INITIAL  Pharmacy Consult for Vancomycin and Zosyn Indication: fever - empiric coverage  Allergies  Allergen Reactions  . Penicillins Rash and Other (See Comments)    Pt has tolerated Ampicillin and Augmentin with recent endocarditis treatment at [facility]  . Pork-Derived Products Anaphylaxis and Other (See Comments)    Fever also   . Ace Inhibitors Cough    Patient Measurements: Height: 6' (182.9 cm) Weight: 290 lb 8 oz (131.77 kg) IBW/kg (Calculated) : 77.6  Vital Signs: Temp: 99.8 F (37.7 C) (01/04 0703) Temp Source: Oral (01/04 0703) BP: 159/60 mmHg (01/04 0500) Pulse Rate: 123 (01/04 0500) Intake/Output from previous day: 01/03 0701 - 01/04 0700 In: 240 [P.O.:240] Out: 700 [Urine:700] Intake/Output from this shift: Total I/O In: 300 [P.O.:300] Out: 200 [Urine:200]  Labs:  Recent Labs  10/14/15 0500 10/15/15 0335 10/15/15 0438 10/16/15 0517  WBC 6.9 5.2 6.4 14.7*  HGB 9.2* 6.6* 8.6* 9.6*  PLT 233 150 197 216  CREATININE 4.19* 3.54*  --  4.74*   Estimated Creatinine Clearance: 25.9 mL/min (by C-G formula based on Cr of 4.74). No results for input(s): VANCOTROUGH, VANCOPEAK, VANCORANDOM, GENTTROUGH, GENTPEAK, GENTRANDOM, TOBRATROUGH, TOBRAPEAK, TOBRARND, AMIKACINPEAK, AMIKACINTROU, AMIKACIN in the last 72 hours.   Microbiology: Recent Results (from the past 720 hour(s))  MRSA PCR Screening     Status: Abnormal   Collection Time: 10/13/15 11:28 AM  Result Value Ref Range Status   MRSA by PCR POSITIVE (A) NEGATIVE Final    Comment:        The GeneXpert MRSA Assay (FDA approved for NASAL specimens only), is one component of a comprehensive MRSA colonization surveillance program. It is not intended to diagnose MRSA infection nor to guide or monitor treatment for MRSA infections. RESULT CALLED TO, READ BACK BY AND VERIFIED WITH: C. SCHILLER RN 10/13/15 AT 33 BY A. DAVIS     Medical History: Past Medical History  Diagnosis Date   . Hypertension   . Leg pain 02/05/11    with walking  . Weight gain   . Shortness of breath     when lying flat  . Diminished eyesight   . Hearing difficulty   . Anemia   . Nervousness(799.21)   . COPD (chronic obstructive pulmonary disease) ([employer])   . CHF (congestive heart failure) ([place])   . Posttraumatic stress disorder   . Allergic rhinitis   . Dyslipidemia   . Morbid obesity ([place])   . Gait disorder   . Bilateral carpal tunnel syndrome   . Gastroparesis   . Gout   . Diabetic retinopathy ([place])   . Renal transplant, status post 04/05/2014  . Incomplete bladder emptying   . Urethral stricture   . Aortic insufficiency     a. Echo 7/16:  Mod LVH, EF 50-55%, mild to mod AI, severe LAE, PASP 57 mmHg (LV ID end diastolic [id number] mm)  . Hyperlipidemia   . Osteomyelitis ([place])   . Endocarditis   . Vitamin D deficiency   . Secondary hyperparathyroidism ([place])   . Rotator cuff disorder   . Discitis of lumbar region     resolved  . Obstructive sleep apnea     not using CPAP- lost 100lbs  . Claustrophobia   . Diabetes mellitus     Type II  . Diabetic peripheral neuropathy ([place])   . GERD (gastroesophageal reflux disease)   . Sepsis ([place])     Postive blood cultures -   . Endocarditis 11/28/14  . Complication  of anesthesia     "they said something at [facility] it was affected by sleep aqpnea."  . Chronic kidney disease     Kidney Transplant 2009 09/26/15- transplanated kidney failing  . Chronic renal allograft nephropathy   . Chronic kidney disease (CKD), stage IV (severe) ([place])   . DVT (deep venous thrombosis) ([place]) 10/07/2015    LLE    Medications:  Scheduled:  . anticoagulant sodium citrate  5 mL Intravenous Once  . aspirin EC  81 mg Oral Daily  . calcitRIOL  1.5 mcg Oral Daily  . Chlorhexidine Gluconate Cloth  6 each Topical Daily  . cloNIDine  0.3 mg Transdermal Weekly  . febuxostat  80 mg Oral Daily  . insulin aspart  0-5 Units Subcutaneous QHS  . insulin aspart   0-9 Units Subcutaneous TID WC  . labetalol  100 mg Oral TID  . mupirocin ointment  1 application Nasal BID  . mycophenolate  750 mg Oral BID  . pantoprazole  40 mg Oral Daily  . polyethylene glycol  17 g Oral BID  . predniSONE  5 mg Oral Q breakfast  . rosuvastatin  20 mg Oral QHS  . sodium chloride  3 mL Intravenous Q12H  . tacrolimus  1 mg Oral BID   Infusions:   Assessment: 50 yo M with progressive kidney disease and new start dialysis.  Pt had temporary femoral cath placed 1/1 and received HD 1/1 and 1/2.  The AM of 1/4 pt developed temp of 103 and is to be started on empiric antibiotics.  Pt does have some residual UOP (700 cc yesterday).  Renal continues to evaluate on whether or not patient will require long-term dialysis.  For now, will load patient with Vancomycin and order future doses based on levels and/or HD sessions.   Goal of Therapy:  Vancomycin trough level 15-20 mcg/ml  Plan:  Vancomycin 2500 mg IV x 1 Follow renal funcion, UOP, and/or plans for hemodialysis Random level in 48h if no dialysis before then Zosyn 2.25 gm IV q8h  [employer], Pharm.D., BCPS Clinical Pharmacist Pager [phone] 10/16/2015 12:08 PM

## 2015-10-16 NOTE — Care Management Note (Signed)
Case Management Note  Patient Details  Name: [name] [name] MRN: [id number] Date of Birth: [birth year]  Subjective/Objective:                  Patient transferred from 43M, +DVT, to have a tunneled dialysis catheter placed. Patient hypertensive, and febrile. Waiting PT evaluation.   Action/Plan:  DC to SNF possible.  Expected Discharge Date:                  Expected Discharge Plan:  Skilled Nursing Facility  In-House Referral:  Clinical Social Work  Discharge planning Services  CM Consult  Post Acute Care Choice:    Choice offered to:     DME Arranged:    DME Agency:     HH Arranged:    [place] Agency:     Status of Service:  In process, will continue to follow  Medicare Important Message Given:  Yes Date Medicare IM Given:    Medicare IM give by:    Date Additional Medicare IM Given:    Additional Medicare Important Message give by:     If discussed at [place] of Stay Meetings, dates discussed:    Additional Comments:  [name], RN 10/16/2015, 11:44 AM

## 2015-10-16 NOTE — Progress Notes (Signed)
Pt back from HD. Pt hasn't voided since urinary catheter removed. Bladder scan showed 0 mL urine. Paged Dr Karleen Hampshire, who said that this is fine and to keep monitoring pt. Will continue to monitor. Bed remains in lowest position and call bell is within reach.

## 2015-10-16 NOTE — Progress Notes (Signed)
[facility] Rounding Note  Subjective:  Required placement of fem HD cath (1/1) with HD 1/1, 1/2 - marked improvement in resp status.  BUN and creatinine rising in interdialytic interval BP better with addition of labetolol Spiked temp to 103 earlier today with sweats and WBC up is up to 14,700 - I do not see that any cultures were done   Physical Exam:  BP 159/60 mmHg  Pulse 123  Temp(Src) 99.8 F (37.7 C) (Oral)  Resp 18  Ht 6' (1.829 m)  Wt 131.77 kg (290 lb 8 oz)  BMI 39.39 kg/m2  SpO2 90%  GEN: c/o HA.  CV: Tachy S1S2 No S3 Lungs anteriorly clear ABD: Non-tender R renal allograft. + BS. EXT:No RLE edema,. 1+ LLE RUE BVT AVF: +B/T (09/27/15) Right fem HD cath with dressing in place  Scheduled Meds: . anticoagulant sodium citrate  5 mL Intravenous Once  . aspirin EC  81 mg Oral Daily  . calcitRIOL  1.5 mcg Oral Daily  . Chlorhexidine Gluconate Cloth  6 each Topical Daily  . cloNIDine  0.3 mg Transdermal Weekly  . febuxostat  80 mg Oral Daily  . insulin aspart  0-5 Units Subcutaneous QHS  . insulin aspart  0-9 Units Subcutaneous TID WC  . labetalol  100 mg Oral TID  . mupirocin ointment  1 application Nasal BID  . mycophenolate  750 mg Oral BID  . pantoprazole  40 mg Oral Daily  . polyethylene glycol  17 g Oral BID  . predniSONE  5 mg Oral Q breakfast  . rosuvastatin  20 mg Oral QHS  . sodium chloride  3 mL Intravenous Q12H  . tacrolimus  1 mg Oral BID      PRN Meds:.sodium chloride, sodium chloride, sodium chloride, acetaminophen **OR** acetaminophen, bisacodyl, hydrALAZINE, labetalol, ondansetron **OR** ondansetron (ZOFRAN) IV, promethazine, sodium chloride, technetium TC 21M diethylenetriame-pentaacetic acid    Recent Labs Lab 10/14/15 0500 10/15/15 0335 10/16/15 0517  NA 142 141 140  K 5.0 4.8 4.9  CL 106 104 102  CO2 26 27 26   GLUCOSE 166* 159* 233*  BUN 73* 51* 64*  CREATININE 4.19* 3.54* 4.74*  CALCIUM 7.3* 7.5* 7.8*  PHOS  --   5.2* 4.9*    Recent Labs Lab 10/14/15 0500 10/15/15 0335 10/15/15 0438 10/16/15 0517  WBC 6.9 5.2 6.4 14.7*  NEUTROABS 5.8  --   --   --   HGB 9.2* 6.6* 8.6* 9.6*  HCT 31.4* 21.7* 28.9* 32.0*  MCV 91.5 90.0 90.9 89.9  PLT 233 150 197 216    10/15/15 CXR IMPRESSION: Improving congestive heart failure. Mild persistent pulmonary edema. Small left pleural effusion cannot be excluded   Background: 78M with CKD4T (failing renal allograft d/t CAN/recent ACR with baseline creatinine PTA around 3.6, progressive), admit with acute LLE DVT . Renal function worsened with diuresis, continued to worsen with hyperkalemia, resp fx despite holding diuretics for 2 days, requiring acute HD (1st 1/1) d/t vol overload, hyperkalemia, hypercarbic resp fx.   ASSESSMENT/RECOMMENDATIONS  1. Progressive CKD4T with AKI, ddKT 2009 [facility], now with CAN, recent ACR (tx with IVIG and pulse steroids); ? AKI 2/2 diuresis, worsened 10/13/15 with hyperkalemia,. Required HD 1/1, 1/2  via temp fem cath. Volume and numbers better. UOP about 600-700/24 hours but with rising creatinine since last HD. I am thinking that he will remain HD dependent. Concerned now about fever and has temp fem cath in place - will HD today, pull catheter, have TDC placed  if temps come down and BC's come back negative.  Continue to hold coumadin (INR still therapeutic despite holding coumadin since Monday) 2. Chronic IS on Tac/MMF/Pred; Fluc/Ganciclovir (TMP/SMX has been stopped). TAC was empirically reduced from 2.5mg  BID to 1mg  BID on 1/1 (appropriately so as TAC level came back at 12)  3. Hypertension - BP quite high and pt tachy. Just on catapres patch and getting prn hydralazine. Add labetolol orally. (CCM has added amlodipine 10 - give that at hs. 4. Acute LLE DVT on Argatroban and was transitioning to warfarin; VQ negative. See above discussion - continue to hold coumadin as may need TDC placement this week 5. AMS - uremic + hypercarbic resp  failure. He is at his baseline today. 6. 2HPTH on VDRA. Check PTH 7. Porcine Allergy 8. Anemia - Hb came back at 6.6 this AM, repeat was 8.6. Will check Fe studies and start Aranesp. 9. CHF - markedly improved.  10. COPD 11. DM 12. Aortic Insufficiency hx/o Aortic IE 11/2014 Enteroccoccus  Faecalis 13. Gout on febuxostat 14. Hyperkalemia - improved post HD X2 15. S/p R BVT 09/27/2015 +B/ 16. Hx/o urethral stricture 17. Gross hematuria - improving. Was due to catheter trauma. 18. FEVER AND LEUKOCYTOSIS. HAD ENTEROCOCCAL ENDOCARDITIS AND OSTEOMYELITIS THIS PAST YEAR. HAS AORTIC VALVE  INSUFF. PANCULTURE, EMPIRIC VANCO AND ZOSYN PER PHARMACY, PULL FEM LINE AFTER HD NOTE HE DOES NOT HAVE A PCN ALLERGY   Jamal Maes, MD [facility] [phone] Pager 10/16/2015, 11:39 AM

## 2015-10-16 NOTE — Progress Notes (Signed)
[facility] PROGRESS NOTE  [name] [name] [id number] DOB: [birth year] DOA: 10/07/2015 PCP: Hoyt Koch, MD  Assessment/Plan: 1. Acute hypercarbic respiratory failure: - resolved. Wean him off oxygen as appropriate.   2. Hypertension: Better controlled. Resume home meds.   3. STAGE 4 ckd S/p renal transplant.  Hyperkalemia resolved.  Resume prograf cell cept and prednisone.  HD catheter placement in the next day.  HD today.   4. Anemia of chronic disease: - stable.   5. Type 2 DM: CBG (last 3)   Recent Labs  10/15/15 2242 10/16/15 0747 10/16/15 1211  GLUCAP 280* 208* 262*    Resume SSI.   Code Status: full code.  Family Communication: none at bedside.  Disposition Plan: pending PT eval.    Consultants:  PCCM  Renal.   Procedures:  HD 1/4  Antibiotics:  none  HPI/Subjective: Breathing improved.   Objective: Filed Vitals:   10/16/15 1700 10/16/15 1740  BP: 160/64 155/63  Pulse: 96 98  Temp:  98.7 F (37.1 C)  Resp: 19 18    Intake/Output Summary (Last 24 hours) at 10/16/15 1908 Last data filed at 10/16/15 1740  Gross per 24 hour  Intake    540 ml  Output   2480 ml  Net  -1940 ml   Filed Weights   10/15/15 1848 10/16/15 1330 10/16/15 1740  Weight: 131.77 kg (290 lb 8 oz) 131.3 kg (289 lb 7.4 oz) 129.5 kg (285 lb 7.9 oz)    Exam:   General:  Alert comfortable.   Cardiovascular: s1s2  Respiratory: diminished at bases.   Abdomen soft NT nd bs+  Musculoskeletal: pedal edema.   Data Reviewed: Basic Metabolic Panel:  Recent Labs Lab 10/13/15 0214 10/13/15 1441 10/14/15 0500 10/15/15 0335 10/16/15 0517  NA 140 140 142 141 140  K 5.8* 6.0* 5.0 4.8 4.9  CL 106 107 106 104 102  CO2 23 21* 26 27 26   GLUCOSE 411* 262* 166* 159* 233*  BUN 106* 111* 73* 51* 64*  CREATININE 5.32* 5.49* 4.19* 3.54* 4.74*  CALCIUM 7.1* 6.9* 7.3* 7.5* 7.8*  PHOS  --   --   --  5.2* 4.9*   Liver Function Tests:  Recent  Labs Lab 10/15/15 0335 10/16/15 0517  AST 15  --   ALT 11*  --   ALKPHOS 81  --   BILITOT 1.2  --   PROT 4.6*  --   ALBUMIN 2.5* 2.5*   No results for input(s): LIPASE, AMYLASE in the last 168 hours.  Recent Labs Lab 10/13/15 0815  AMMONIA 37*   CBC:  Recent Labs Lab 10/13/15 0214 10/14/15 0500 10/15/15 0335 10/15/15 0438 10/16/15 0517  WBC 7.1 6.9 5.2 6.4 14.7*  NEUTROABS  --  5.8  --   --   --   HGB 9.8* 9.2* 6.6* 8.6* 9.6*  HCT 33.5* 31.4* 21.7* 28.9* 32.0*  MCV 93.8 91.5 90.0 90.9 89.9  PLT 267 233 150 197 216   Cardiac Enzymes:  Recent Labs Lab 10/10/15 0527 10/10/15 1130 10/10/15 1740  TROPONINI 0.10* 0.09* 0.09*   BNP (last 3 results)  Recent Labs  10/07/15 1145  BNP 1430.7*    ProBNP (last 3 results) No results for input(s): PROBNP in the last 8760 hours.  CBG:  Recent Labs Lab 10/15/15 1115 10/15/15 1602 10/15/15 2242 10/16/15 0747 10/16/15 1211  GLUCAP 164* 202* 280* 208* 262*    Recent Results (from the past 240 hour(s))  MRSA PCR Screening  Status: Abnormal   Collection Time: 10/13/15 11:28 AM  Result Value Ref Range Status   MRSA by PCR POSITIVE (A) NEGATIVE Final    Comment:        The GeneXpert MRSA Assay (FDA approved for NASAL specimens only), is one component of a comprehensive MRSA colonization surveillance program. It is not intended to diagnose MRSA infection nor to guide or monitor treatment for MRSA infections. RESULT CALLED TO, READ BACK BY AND VERIFIED WITH: C. SCHILLER RN 10/13/15 AT 1300 BY A. DAVIS      Studies: Dg Chest Port 1 View  10/15/2015  CLINICAL DATA:  Pulmonary edema. EXAM: PORTABLE CHEST 1 VIEW COMPARISON:  10/14/2015. FINDINGS: Mediastinum hilar structures stable. Cardiomegaly with pulmonary venous congestion. Interim improvement of bilateral pulmonary infiltrates/ edema. Small left pleural effusion cannot be excluded. No pneumothorax. IMPRESSION: Improving congestive heart failure. Mild  persistent pulmonary edema. Small left pleural effusion cannot be excluded. Electronically Signed   By: Marcello Moores  Register   On: 10/15/2015 07:30    Scheduled Meds: . anticoagulant sodium citrate  5 mL Intravenous Once  . aspirin EC  81 mg Oral Daily  . calcitRIOL  1.5 mcg Oral Daily  . Chlorhexidine Gluconate Cloth  6 each Topical Daily  . cloNIDine  0.3 mg Transdermal Weekly  . febuxostat  80 mg Oral Daily  . insulin aspart  0-5 Units Subcutaneous QHS  . insulin aspart  0-9 Units Subcutaneous TID WC  . labetalol  100 mg Oral TID  . mupirocin ointment  1 application Nasal BID  . mycophenolate  750 mg Oral BID  . pantoprazole  40 mg Oral Daily  . piperacillin-tazobactam (ZOSYN)  IV  2.25 g Intravenous 3 times per day  . polyethylene glycol  17 g Oral BID  . predniSONE  5 mg Oral Q breakfast  . rosuvastatin  20 mg Oral QHS  . sodium chloride  3 mL Intravenous Q12H  . tacrolimus  1 mg Oral BID   Continuous Infusions:   Principal Problem:   Deep vein thrombosis (DVT) of left lower extremity (HCC) Active Problems:   Obstructive sleep apnea   KIDNEY TRANSPLANTATION, HX OF   Benign essential HTN   H/O kidney transplant   Diabetes mellitus, type 2 (HCC)   Diabetic peripheral neuropathy (HCC)   Chronic kidney disease (CKD), stage IV (severe) (HCC)   CHF (congestive heart failure) (HCC)   Hyperkalemia   Prolonged Q-T interval on ECG   Chronic deep vein thrombosis (DVT) of left lower extremity (HCC)   Acute on chronic congestive heart failure ([place])   Acute pulmonary edema ([place])    Time spent: 25 min    [place] [facility] Pager [phone]. If 7PM-7AM, please contact night-coverage at [email or web address], password [facility] 10/16/2015, 7:08 PM  LOS: 9 days

## 2015-10-16 NOTE — Progress Notes (Signed)
Pt has temp of 103.0, PRN tylenol given. On Call paged. Will continue to monitor pt.

## 2015-10-16 NOTE — Progress Notes (Signed)
Inpatient Diabetes Program Recommendations  AACE/ADA: New Consensus Statement on Inpatient Glycemic Control (2015)  Target Ranges:  Prepandial:   less than 140 mg/dL      Peak postprandial:   less than 180 mg/dL (1-2 hours)      Critically ill patients:  140 - 180 mg/dL    Results for [name] [name], [name] [name] (MRN [id number]) as of 10/16/2015 14:09  Ref. Range 10/15/2015 08:13 10/15/2015 11:15 10/15/2015 16:02 10/15/2015 22:42 10/16/2015 07:47 10/16/2015 12:11  Glucose-Capillary Latest Ref Range: 65-99 mg/dL 134 (H) 164 (H) 202 (H) 280 (H) 208 (H) 262 (H)   Review of Glycemic Control  Diabetes history: DM 2 Current orders for Inpatient glycemic control: Novolog Sensitive + HS scale  Inpatient Diabetes Program Recommendations: Insulin - Meal Coverage: Glucose in 200's and increases at meal times. Please consider ordering Novolog 3 units TID meal coverage in addition to correction.  Thanks,  Tama Headings RN, MSN, [facility] Inpatient Diabetes Coordinator Team Pager [phone] (8a-5p)

## 2015-10-17 ENCOUNTER — Inpatient Hospital Stay (HOSPITAL_COMMUNITY): Payer: Medicare Other

## 2015-10-17 LAB — CBC
HCT: 29.1 % — ABNORMAL LOW (ref 39.0–52.0)
Hemoglobin: 8.9 g/dL — ABNORMAL LOW (ref 13.0–17.0)
MCH: 28.1 pg (ref 26.0–34.0)
MCHC: 30.6 g/dL (ref 30.0–36.0)
MCV: 91.8 fL (ref 78.0–100.0)
Platelets: 179 10*3/uL (ref 150–400)
RBC: 3.17 MIL/uL — ABNORMAL LOW (ref 4.22–5.81)
RDW: 14.9 % (ref 11.5–15.5)
WBC: 11.3 10*3/uL — ABNORMAL HIGH (ref 4.0–10.5)

## 2015-10-17 LAB — APTT
aPTT: 48 seconds — ABNORMAL HIGH (ref 24–37)
aPTT: 114 seconds — ABNORMAL HIGH (ref 24–37)
aPTT: 95 seconds — ABNORMAL HIGH (ref 24–37)

## 2015-10-17 LAB — RENAL FUNCTION PANEL
Albumin: 2.1 g/dL — ABNORMAL LOW (ref 3.5–5.0)
Anion gap: 9 (ref 5–15)
BUN: 46 mg/dL — ABNORMAL HIGH (ref 6–20)
CO2: 27 mmol/L (ref 22–32)
Calcium: 7.7 mg/dL — ABNORMAL LOW (ref 8.9–10.3)
Chloride: 100 mmol/L — ABNORMAL LOW (ref 101–111)
Creatinine, Ser: 4.41 mg/dL — ABNORMAL HIGH (ref 0.61–1.24)
GFR calc Af Amer: 16 mL/min — ABNORMAL LOW (ref >60)
GFR calc non Af Amer: 14 mL/min — ABNORMAL LOW (ref >60)
Glucose, Bld: 276 mg/dL — ABNORMAL HIGH (ref 65–99)
Phosphorus: 5.7 mg/dL — ABNORMAL HIGH (ref 2.5–4.6)
Potassium: 4.5 mmol/L (ref 3.5–5.1)
Sodium: 136 mmol/L (ref 135–145)

## 2015-10-17 LAB — GLUCOSE, CAPILLARY
Glucose-Capillary: 221 mg/dL — ABNORMAL HIGH (ref 65–99)
Glucose-Capillary: 260 mg/dL — ABNORMAL HIGH (ref 65–99)
Glucose-Capillary: 330 mg/dL — ABNORMAL HIGH (ref 65–99)
Glucose-Capillary: 374 mg/dL — ABNORMAL HIGH (ref 65–99)

## 2015-10-17 LAB — PROTIME-INR
INR: 1.92 — ABNORMAL HIGH (ref 0.00–1.49)
Prothrombin Time: 21.9 seconds — ABNORMAL HIGH (ref 11.6–15.2)

## 2015-10-17 LAB — PARATHYROID HORMONE, INTACT (NO CA): PTH: 408 pg/mL — ABNORMAL HIGH (ref 15–65)

## 2015-10-17 MED ORDER — INSULIN ASPART 100 UNIT/ML ~~LOC~~ SOLN
2.0000 [IU] | Freq: Three times a day (TID) | SUBCUTANEOUS | Status: DC
  Administered 2015-10-17 – 2015-10-18 (×4): 2 [IU] via SUBCUTANEOUS

## 2015-10-17 MED ORDER — INSULIN ASPART 100 UNIT/ML ~~LOC~~ SOLN
0.0000 [IU] | Freq: Three times a day (TID) | SUBCUTANEOUS | Status: DC
Start: 2015-10-17 — End: 2015-10-20
  Administered 2015-10-17 – 2015-10-18 (×2): 11 [IU] via SUBCUTANEOUS
  Administered 2015-10-18: 8 [IU] via SUBCUTANEOUS
  Administered 2015-10-18 – 2015-10-19 (×2): 11 [IU] via SUBCUTANEOUS
  Administered 2015-10-19 – 2015-10-20 (×3): 8 [IU] via SUBCUTANEOUS

## 2015-10-17 MED ORDER — CALCITRIOL 0.25 MCG PO CAPS
2.0000 ug | ORAL_CAPSULE | Freq: Every day | ORAL | Status: DC
  Administered 2015-10-18: 2 ug via ORAL
  Filled 2015-10-17: qty 8

## 2015-10-17 MED ORDER — CALCIUM ACETATE (PHOS BINDER) 667 MG PO CAPS
1334.0000 mg | ORAL_CAPSULE | Freq: Three times a day (TID) | ORAL | Status: DC
  Administered 2015-10-17 – 2015-10-18 (×4): 1334 mg via ORAL
  Filled 2015-10-17 (×3): qty 2

## 2015-10-17 MED ORDER — MORPHINE SULFATE (PF) 2 MG/ML IV SOLN
1.0000 mg | INTRAVENOUS | Status: DC | PRN
  Administered 2015-10-17 – 2015-10-19 (×5): 2 mg via INTRAVENOUS
  Administered 2015-10-19: 1 mg via INTRAVENOUS
  Administered 2015-10-19: 2 mg via INTRAVENOUS
  Filled 2015-10-17 (×6): qty 1

## 2015-10-17 MED ORDER — ARGATROBAN 50 MG/50ML IV SOLN
0.2800 ug/kg/min | INTRAVENOUS | Status: DC
  Administered 2015-10-17: 0.9 ug/kg/min via INTRAVENOUS
  Administered 2015-10-17: 1.2 ug/kg/min via INTRAVENOUS
  Filled 2015-10-17 (×5): qty 50

## 2015-10-17 MED ORDER — MORPHINE SULFATE (PF) 2 MG/ML IV SOLN
2.0000 mg | INTRAVENOUS | Status: AC
  Administered 2015-10-18: 2 mg via INTRAVENOUS
  Filled 2015-10-17: qty 1

## 2015-10-17 NOTE — Progress Notes (Signed)
[facility] PROGRESS NOTE  [name] [name] [id number] DOB: [birth year] DOA: 10/07/2015 PCP: Hoyt Koch, MD  Assessment/Plan: 1. Acute hypercarbic respiratory failure: - resolved. Wean him off oxygen as appropriate.   2. Hypertension: Better controlled. Resume home meds.   3. STAGE 4 ckd S/p renal transplant.  Hyperkalemia resolved.  Resume prograf cell cept and prednisone.  HD catheter placement as per renal.  Post HD femoral HD catheter is removed.   4. Anemia of chronic disease: - stable.   5. Type 2 DM: CBG (last 3)   Recent Labs  10/17/15 0804 10/17/15 1138 10/17/15 1635  GLUCAP 221* 260* 330*    Resume SSI.  Increased to mod scale.  hgba1c ordered.   6. Urinary retention: Foley insertion.   7. Fever: blood cultures drawn yesterday after the HD and was empirically started on vancomycin and zosyn. CXR shows mild vascular congestion.    Code Status: full code.  Family Communication: none at bedside.  Disposition Plan: pending PT eval.    Consultants:  PCCM  Renal.   Procedures:  HD 1/4  Antibiotics:  none  HPI/Subjective: Breathing improved. No new complaints.   Objective: Filed Vitals:   10/17/15 1451 10/17/15 1806  BP: 134/56 143/39  Pulse: 98 89  Temp: 99.4 F (37.4 C)   Resp: 20     Intake/Output Summary (Last 24 hours) at 10/17/15 1830 Last data filed at 10/17/15 1812  Gross per 24 hour  Intake    810 ml  Output      0 ml  Net    810 ml   Filed Weights   10/15/15 1848 10/16/15 1330 10/16/15 1740  Weight: 131.77 kg (290 lb 8 oz) 131.3 kg (289 lb 7.4 oz) 129.5 kg (285 lb 7.9 oz)    Exam:   General:  Alert comfortable.   Cardiovascular: s1s2  Respiratory: diminished at bases.   Abdomen soft NT nd bs+  Musculoskeletal: pedal edema.   Data Reviewed: Basic Metabolic Panel:  Recent Labs Lab 10/13/15 1441 10/14/15 0500 10/15/15 0335 10/16/15 0517 10/17/15 0645  NA 140 142 141 140 136  K  6.0* 5.0 4.8 4.9 4.5  CL 107 106 104 102 100*  CO2 21* 26 27 26 27   GLUCOSE 262* 166* 159* 233* 276*  BUN 111* 73* 51* 64* 46*  CREATININE 5.49* 4.19* 3.54* 4.74* 4.41*  CALCIUM 6.9* 7.3* 7.5* 7.8* 7.7*  PHOS  --   --  5.2* 4.9* 5.7*   Liver Function Tests:  Recent Labs Lab 10/15/15 0335 10/16/15 0517 10/17/15 0645  AST 15  --   --   ALT 11*  --   --   ALKPHOS 81  --   --   BILITOT 1.2  --   --   PROT 4.6*  --   --   ALBUMIN 2.5* 2.5* 2.1*   No results for input(s): LIPASE, AMYLASE in the last 168 hours.  Recent Labs Lab 10/13/15 0815  AMMONIA 37*   CBC:  Recent Labs Lab 10/14/15 0500 10/15/15 0335 10/15/15 0438 10/16/15 0517 10/17/15 0645  WBC 6.9 5.2 6.4 14.7* 11.3*  NEUTROABS 5.8  --   --   --   --   HGB 9.2* 6.6* 8.6* 9.6* 8.9*  HCT 31.4* 21.7* 28.9* 32.0* 29.1*  MCV 91.5 90.0 90.9 89.9 91.8  PLT 233 150 197 216 179   Cardiac Enzymes: No results for input(s): CKTOTAL, CKMB, CKMBINDEX, TROPONINI in the last 168 hours. BNP (last 3 results)  Recent  Labs  10/07/15 1145  BNP 1430.7*    ProBNP (last 3 results) No results for input(s): PROBNP in the last 8760 hours.  CBG:  Recent Labs Lab 10/16/15 1211 10/16/15 2139 10/17/15 0804 10/17/15 1138 10/17/15 1635  GLUCAP 262* 291* 221* 260* 330*    Recent Results (from the past 240 hour(s))  MRSA PCR Screening     Status: Abnormal   Collection Time: 10/13/15 11:28 AM  Result Value Ref Range Status   MRSA by PCR POSITIVE (A) NEGATIVE Final    Comment:        The GeneXpert MRSA Assay (FDA approved for NASAL specimens only), is one component of a comprehensive MRSA colonization surveillance program. It is not intended to diagnose MRSA infection nor to guide or monitor treatment for MRSA infections. RESULT CALLED TO, READ BACK BY AND VERIFIED WITH: C. SCHILLER RN 10/13/15 AT 1300 BY A. DAVIS   Culture, blood (Routine X 2) w Reflex to ID Panel     Status: None (Preliminary result)   Collection  Time: 10/16/15  2:15 PM  Result Value Ref Range Status   Specimen Description BLOOD HEMODIALYSIS CATHETER  Final   Special Requests BOTTLES DRAWN AEROBIC AND ANAEROBIC 10CC  Final   Culture NO GROWTH < 24 HOURS  Final   Report Status PENDING  Incomplete  Culture, blood (Routine X 2) w Reflex to ID Panel     Status: None (Preliminary result)   Collection Time: 10/16/15  2:30 PM  Result Value Ref Range Status   Specimen Description BLOOD HEMODIALYSIS CATHETER  Final   Special Requests BOTTLES DRAWN AEROBIC AND ANAEROBIC 10CC  Final   Culture NO GROWTH < 24 HOURS  Final   Report Status PENDING  Incomplete     Studies: Dg Chest 2 View  10/17/2015  CLINICAL DATA:  Fever and weakness. EXAM: CHEST  2 VIEW COMPARISON:  10/15/2015, 06/04/2011 and 12/12/2007 FINDINGS: Patient slightly rotated to the left. Lungs are adequately inflated demonstrate no focal consolidation or effusion. There is prominence of the perihilar markings suggesting mild vascular congestion. There is mild stable cardiomegaly. Degenerative changes of the spine. Remainder the exam is unchanged. IMPRESSION: Mild stable cardiomegaly with evidence of mild vascular congestion. Electronically Signed   By: Marin Olp M.D.   On: 10/17/2015 09:04    Scheduled Meds: . anticoagulant sodium citrate  5 mL Intravenous Once  . aspirin EC  81 mg Oral Daily  . [START ON 10/18/2015] calcitRIOL  2 mcg Oral Daily  . calcium acetate  1,334 mg Oral TID WC  . cloNIDine  0.3 mg Transdermal Weekly  . febuxostat  80 mg Oral Daily  . insulin aspart  0-15 Units Subcutaneous TID WC  . insulin aspart  0-5 Units Subcutaneous QHS  . insulin aspart  2 Units Subcutaneous TID WC  . labetalol  100 mg Oral TID  . mupirocin ointment  1 application Nasal BID  . mycophenolate  750 mg Oral BID  . pantoprazole  40 mg Oral Daily  . piperacillin-tazobactam (ZOSYN)  IV  2.25 g Intravenous 3 times per day  . polyethylene glycol  17 g Oral BID  . predniSONE  5 mg  Oral Q breakfast  . rosuvastatin  20 mg Oral QHS  . sodium chloride  3 mL Intravenous Q12H  . tacrolimus  1 mg Oral BID   Continuous Infusions: . argatroban 0.9 mcg/kg/min (10/17/15 1736)    Principal Problem:   Deep vein thrombosis (DVT) of left lower extremity (  [place]) Active Problems:   Obstructive sleep apnea   KIDNEY TRANSPLANTATION, HX OF   Benign essential HTN   H/O kidney transplant   Diabetes mellitus, type 2 (HCC)   Diabetic peripheral neuropathy (HCC)   Chronic kidney disease (CKD), stage IV (severe) (HCC)   CHF (congestive heart failure) (HCC)   Hyperkalemia   Prolonged Q-T interval on ECG   Chronic deep vein thrombosis (DVT) of left lower extremity ([place])   Acute on chronic congestive heart failure ([place])   Acute pulmonary edema ([place])    Time spent: 25 min    [place] [facility] Pager [phone]. If 7PM-7AM, please contact night-coverage at [email or web address], password [facility] 10/17/2015, 6:30 PM  LOS: 10 days

## 2015-10-17 NOTE — Progress Notes (Signed)
ANTICOAGULATION CONSULT NOTE - Follow Up Pharmacy Consult for Argatroban (Warfarin on hold) Indication: DVT  Patient Measurements: Height: 6' (182.9 cm) Weight: 285 lb 7.9 oz (129.5 kg) IBW/kg (Calculated) : 77.6  Vital Signs: Temp: 99.3 F (37.4 C) (01/05 1030) Temp Source: Oral (01/05 1030) BP: 137/59 mmHg (01/05 1030) Pulse Rate: 95 (01/05 1030)  Labs:  Recent Labs  10/15/15 0335 10/15/15 0438 10/16/15 0517 10/17/15 0645  HGB 6.6* 8.6* 9.6* 8.9*  HCT 21.7* 28.9* 32.0* 29.1*  PLT 150 197 216 179  APTT 42*  --  51* 48*  LABPROT 26.4*  --  26.1* 21.9*  INR 2.46*  --  2.42* 1.92*  CREATININE 3.54*  --  4.74* 4.41*    Assessment: 50 yo M admitted 12/26 and found to have DVT left popliteal vein. VQ negative for PE.  Argatroban was started on 12/26  due to porcine allergy and patient was initiated on Coumadin.  However, admission has been complicated by AoCKD requiring HD 1/1, 1/2, and 1/4.  Coumadin has been stopped to allow INR to trend down in anticipation of HD access placement.  INR <2 today, to restart Argatroban for bridging.  Pt was previously therapeutic on on 1.31mcg/kg/min.   Of note, Argatroban does falsely elevate INR so INR may appear to be rising initially despite the fact that Coumadin is on hold.  Goal of Therapy:  aPTT 50-90 seconds Monitor platelets by anticoagulation protocol: Yes    Plan:  Argatroban 1.2 mcg/kg/min Continue to hold Coumadin aPTT q2h until therapeutic x 2, then daily. Daily CBC and INR Follow-up plans for surgery  Horton Chin, Pharm.D., BCPS Clinical Pharmacist Pager [phone] 10/17/2015 11:05 AM

## 2015-10-17 NOTE — Progress Notes (Signed)
[facility] Rounding Note  Subjective:  Required placement of fem HD cath (1/1) with HD 1/1, 1/2, 1/4   Making little to no urine BP better with addition of labetolol Spiked temp to 103 yesterday with chills and WBC to 14,700 Cultures pending Had HD and fem cath removed    Physical Exam:  BP 137/59 mmHg  Pulse 95  Temp(Src) 99.3 F (37.4 C) (Oral)  Resp 18  Ht 6' (1.829 m)  Wt 129.5 kg (285 lb 7.9 oz)  BMI 38.71 kg/m2  SpO2 96%  Persistent low grade fever CV: Tachy S1S2 No S3 Lungs anteriorly clear ABD: Non-tender R renal allograft. + BS. EXT:No RLE edema,. 1+ LLE RUE BVT AVF: +B/T (09/27/15)   Scheduled Meds: . anticoagulant sodium citrate  5 mL Intravenous Once  . aspirin EC  81 mg Oral Daily  . calcitRIOL  1.5 mcg Oral Daily  . cloNIDine  0.3 mg Transdermal Weekly  . febuxostat  80 mg Oral Daily  . insulin aspart  0-5 Units Subcutaneous QHS  . insulin aspart  0-9 Units Subcutaneous TID WC  . labetalol  100 mg Oral TID  . mupirocin ointment  1 application Nasal BID  . mycophenolate  750 mg Oral BID  . pantoprazole  40 mg Oral Daily  . piperacillin-tazobactam (ZOSYN)  IV  2.25 g Intravenous 3 times per day  . polyethylene glycol  17 g Oral BID  . predniSONE  5 mg Oral Q breakfast  . rosuvastatin  20 mg Oral QHS  . sodium chloride  3 mL Intravenous Q12H  . tacrolimus  1 mg Oral BID    . argatroban 1.2 mcg/kg/min (10/17/15 1153)   PRN Meds:.sodium chloride, sodium chloride, sodium chloride, acetaminophen **OR** acetaminophen, bisacodyl, hydrALAZINE, labetalol, ondansetron **OR** ondansetron (ZOFRAN) IV, promethazine, sodium chloride, technetium TC 28M diethylenetriame-pentaacetic acid    Recent Labs Lab 10/15/15 0335 10/16/15 0517 10/17/15 0645  NA 141 140 136  K 4.8 4.9 4.5  CL 104 102 100*  CO2 27 26 27   GLUCOSE 159* 233* 276*  BUN 51* 64* 46*  CREATININE 3.54* 4.74* 4.41*  CALCIUM 7.5* 7.8* 7.7*  PHOS 5.2* 4.9* 5.7*    Recent  Labs Lab 10/14/15 0500  10/15/15 0438 10/16/15 0517 10/17/15 0645  WBC 6.9  < > 6.4 14.7* 11.3*  NEUTROABS 5.8  --   --   --   --   HGB 9.2*  < > 8.6* 9.6* 8.9*  HCT 31.4*  < > 28.9* 32.0* 29.1*  MCV 91.5  < > 90.9 89.9 91.8  PLT 233  < > 197 216 179  < > = values in this interval not displayed.  Lab Results  Component Value Date   INR 1.92* 10/17/2015   INR 2.42* 10/16/2015   INR 2.46* 10/15/2015   1/4 BC's pending 1/2 UC neg  Results for DERRILL, SURTI (MRN [id number]) as of 10/17/2015 12:35  Ref. Range 10/16/2015 05:17  Iron Latest Ref Range: 45-182 ug/dL 6 (L)  UIBC Latest Units: ug/dL 218  TIBC Latest Ref Range: 250-450 ug/dL 224 (L)  Saturation Ratios Latest Ref Range: 17.9-39.5 % 3 (L)   10/15/15 CXR IMPRESSION: Improving congestive heart failure. Mild persistent pulmonary edema. Small left pleural effusion cannot be excluded 1/4 CXR IMPRESSION: Mild stable cardiomegaly with evidence of mild vascular congestion.  Background: 1M with CKD4T (failing renal allograft d/t CAN/recent ACR with baseline creatinine PTA around 3.6, progressive), admit with acute LLE DVT . Renal function worsened with diuresis,  continued to worsen with hyperkalemia, resp fx despite holding diuretics for 2 days, requiring acute HD (1st 1/1) d/t vol overload, hyperkalemia, hypercarbic resp fx.   ASSESSMENT/RECOMMENDATIONS  1. Progressive CKD4T with AKI, ddKT 2009 [facility], now with CAN, recent ACR (tx with IVIG and pulse steroids); ? AKI 2/2 diuresis, worsened 10/13/15 with hyperkalemia,. Required HD 1/1, 1/2, 1/4  via temp fem cath. Volume and numbers better.  I am thinking that he will remain HD dependent. Concerned about fever so pulled temp cath after HD yesterday. Cultures negative so far, temps more low grade and WBC has dropped some. Will have [place] placed if temps come down and BC's come back negative.  Continue to hold coumadin (INR still elevated despite holding coumadin since Monday) 2. Chronic IS on  Tac/MMF/Pred; Fluc/Ganciclovir (TMP/SMX has been stopped). TAC was empirically reduced from 2.5mg  BID to 1mg  BID on 1/1 (appropriately so as TAC level came back at 12)  3. Hypertension - Improved with meds and volume removal 4. Acute LLE DVT Argatroban. Was transitioning to warfarin; VQ negative. See above discussion - continue to hold coumadin as may need TDC placement this week 5. AMS - uremic + hypercarbic resp failure. He is at his baseline today. 6. 2HPTH on VDRA. PTH 408. Will increase dose to 2 mcg/day. Add ca acetate. 7. Porcine Allergy 8. Anemia - Hb came back at 6.6 this AM, repeat was 8.6. TSat low.Will wait on BC results before starting IV Fe. Darbe with next HD 9. CHF - markedly improved with volume removal.  10. COPD 11. DM 12. Aortic Insufficiency hx/o Aortic IE 11/2014 Enteroccoccus  Faecalis 13. Gout on febuxostat 14. S/p R BVT 09/27/2015 +B/T 15. Hx/o urethral stricture  16. FEVER AND LEUKOCYTOSIS 10/15/14. HAD ENTEROCOCCAL ENDOCARDITIS AND OSTEOMYELITIS THIS PAST YEAR. HAS AORTIC VALVE  INSUFF. PANCULTURED, STARTED EMPIRIC VANCO AND ZOSYN PER PHARMACY, PULLED FEM LINE AFTER HD (NOTE HE DOES NOT HAVE A PCN ALLERGY) temps down, WBC falling.    Jamal Maes, MD [facility] [phone] Pager 10/17/2015, 12:36 PM

## 2015-10-17 NOTE — Progress Notes (Addendum)
ANTICOAGULATION CONSULT NOTE - Follow Up Pharmacy Consult for Argatroban (Warfarin on hold) Indication: DVT  Patient Measurements: Height: 6' (182.9 cm) Weight: 285 lb 7.9 oz (129.5 kg) IBW/kg (Calculated) : 77.6  Vital Signs: Temp: 99.4 F (37.4 C) (01/05 1451) Temp Source: Oral (01/05 1451) BP: 143/39 mmHg (01/05 1806) Pulse Rate: 89 (01/05 1806)  Labs:  Recent Labs  10/15/15 0335 10/15/15 0438 10/16/15 0517 10/17/15 0645 10/17/15 1434 10/17/15 1812  HGB 6.6* 8.6* 9.6* 8.9*  --   --   HCT 21.7* 28.9* 32.0* 29.1*  --   --   PLT 150 197 216 179  --   --   APTT 42*  --  51* 48* 114* 95*  LABPROT 26.4*  --  26.1* 21.9*  --   --   INR 2.46*  --  2.42* 1.92*  --   --   CREATININE 3.54*  --  4.74* 4.41*  --   --     Assessment: 50 yo M admitted 12/26 and found to have DVT left popliteal vein. VQ negative for PE.  Argatroban was started on 12/26  due to porcine allergy and patient was initiated on Coumadin.  However, admission has been complicated by AoCKD requiring HD 1/1, 1/2, and 1/4.  Coumadin has been stopped to allow INR to trend down in anticipation of HD access placement.  INR <2 today, to restart Argatroban for bridging.    Pt was previously therapeutic on on 1.14mcg/kg/min so that rate was used for the restart however initial aptt was above goal this afternoon at 114 seconds. Will decrease rate by 30% and recheck level  Of note, Argatroban does falsely elevate INR so INR may appear to be rising initially despite the fact that Coumadin is on hold.  Goal of Therapy:  aPTT 50-90 seconds Monitor platelets by anticoagulation protocol: Yes    Plan:  Decrease Argatroban to 0.9 mcg/kg/min Continue to hold Coumadin aPTT q2h until therapeutic x 2, then daily. Daily CBC and INR Follow-up plans for surgery  Erin Hearing PharmD., BCPS Clinical Pharmacist Pager [phone] 10/17/2015 7:43 PM   Follow up aptt on argatroban 0.53mcg/kg/min is still just above goal at 95s..    Will decrease rate to 0.26mcg/kg/min. Recheck aptt in 2 hours  10/17/2015 7:44 PM

## 2015-10-17 NOTE — Evaluation (Signed)
Physical Therapy Evaluation Patient Details Name: [name] [name] MRN: [id number] DOB: 1965/11/18 Today's Date: 10/17/2015   History of Present Illness  Pt adm with LLE DVT and CKD. Pt required bipap and HD was initiated. Pt with hx of renal transplant 7 years ago.  PMH - DM with neuropathy, HTN, CAD, COPD, endocarditis, back pain, osteomyelitis  Clinical Impression  Pt admitted with above diagnosis and presents to PT with functional limitations due to deficits listed below (See PT problem list). Pt needs skilled PT to maximize independence and safety to allow discharge to ST-SNF prior to return home alone with intermittent support of aides. Appears pt may now have to contend with HD after dc as well which will make it difficult for him to manage at home if he were to return home without rehab at [facility].     Follow Up Recommendations SNF    Equipment Recommendations  None recommended by PT    Recommendations for Other Services       Precautions / Restrictions Precautions Precautions: Fall      Mobility  Bed Mobility Overal bed mobility: Needs Assistance Bed Mobility: Supine to Sit     Supine to sit: Mod assist     General bed mobility comments: Assist to bring legs off bed and to elevate trunk into sitting.  Transfers Overall transfer level: Needs assistance Equipment used: Rolling walker (2 wheeled) Transfers: Sit to/from Stand Sit to Stand: +2 physical assistance;Min assist         General transfer comment: Assist to bring hips up and for balance. Verbal cues to stand more erect and fully extend hips.  Ambulation/Gait Ambulation/Gait assistance: Min assist;+2 safety/equipment Ambulation Distance (Feet): 3 Feet Assistive device: Rolling walker (2 wheeled) Gait Pattern/deviations: Step-through pattern;Decreased step length - right;Decreased step length - left;Shuffle Gait velocity: decr Gait velocity interpretation: Below normal speed for age/gender General Gait  Details: Assist for balance and support. Distance limited by pt feeling weak.  Stairs            Wheelchair Mobility    Modified Rankin (Stroke Patients Only)       Balance Overall balance assessment: Needs assistance Sitting-balance support: No upper extremity supported;Feet supported Sitting balance-Leahy Scale: Good     Standing balance support: Bilateral upper extremity supported Standing balance-Leahy Scale: Poor Standing balance comment: support of walker and min A to stand x 1 minute.                              Pertinent Vitals/Pain Pain Assessment: Faces Faces Pain Scale: Hurts little more Pain Location: back with initial mobility Pain Descriptors / Indicators: Grimacing Pain Intervention(s): Limited activity within patient's tolerance;Monitored during session    Home Living Family/patient expects to be discharged to:: Private residence Living Arrangements: Alone Available Help at Discharge: Personal care attendant Type of Home: [place]: One level Home Equipment: Transport planner Additional Comments: Information from prior encounter.     Prior Function Level of Independence: Needs assistance   Gait / Transfers Assistance Needed: Primarily uses power scooter  ADL's / Homemaking Assistance Needed: Has aide daily        Hand Dominance   Dominant Hand: Right    Extremity/Trunk Assessment   Upper Extremity Assessment: Defer to OT evaluation           Lower Extremity Assessment: Generalized weakness         Communication  Communication: No difficulties  Cognition Arousal/Alertness: Awake/alert Behavior During Therapy: WFL for tasks assessed/performed Overall Cognitive Status: Within Functional Limits for tasks assessed                      General Comments      Exercises        Assessment/Plan    PT Assessment Patient needs continued PT services  PT Diagnosis Difficulty  walking;Generalized weakness   PT Problem List Decreased strength;Decreased activity tolerance;Decreased balance;Decreased mobility;Obesity  PT Treatment Interventions DME instruction;Gait training;Functional mobility training;Therapeutic activities;Therapeutic exercise;Balance training;Patient/family education   PT Goals (Current goals can be found in the Care Plan section) Acute Rehab PT Goals Patient Stated Goal: not stated PT Goal Formulation: With patient Time For Goal Achievement: 10/31/15 Potential to Achieve Goals: Good    Frequency Min 3X/week   Barriers to discharge Decreased caregiver support lives alone with intermittent assist of aides    Co-evaluation               End of Session Equipment Utilized During Treatment: Oxygen Activity Tolerance: Patient limited by fatigue Patient left: in bed;with call bell/phone within reach;with bed alarm set (sitting EOB) Nurse Communication: Mobility status         Time: [id number] PT Time Calculation (min) (ACUTE ONLY): 29 min   Charges:   PT Evaluation $PT Eval Moderate Complexity: 1 Procedure PT Treatments $Gait Training: 8-22 mins   PT G Codes:        Tyrene Nader 10-24-2015, 3:03 PM [employer] PT [phone]

## 2015-10-17 NOTE — Progress Notes (Signed)
Pt unable to void. Bladder scan result is 372 mL. Dr Karleen Hampshire ordered to insert Urinary Catheter. Will insert as soon as possible.

## 2015-10-17 NOTE — Progress Notes (Signed)
Coude catheter inserted by nurse from 3W. Pt in a  Lot of pain afterwards. Paged Dr Karleen Hampshire. Waiting to hear back from dr. Aletha Halim continue to monitor pt. Bed remains in lowest position.

## 2015-10-17 NOTE — Progress Notes (Signed)
Results for MURTAZA, DIOS (MRN [id number]) as of 10/17/2015 09:56  Ref. Range 10/15/2015 22:42 10/16/2015 07:47 10/16/2015 12:11 10/16/2015 21:39 10/17/2015 08:04  Glucose-Capillary Latest Ref Range: 65-99 mg/dL 280 (H) 208 (H) 262 (H) 291 (H) 221 (H)  Noted that blood sugars continue to be greater than 200 mg/dl. Recommend increasing Novolog correction scale to MODERATE TID & HS and adding Novolog 3-4 units TID with meals if eating at least 50% of meal. Will continue to monitor blood sugars while in the hospital. Harvel Ricks RN BSN CDE

## 2015-10-18 LAB — CBC
HCT: 28.1 % — ABNORMAL LOW (ref 39.0–52.0)
Hemoglobin: 8.4 g/dL — ABNORMAL LOW (ref 13.0–17.0)
MCH: 27.5 pg (ref 26.0–34.0)
MCHC: 29.9 g/dL — ABNORMAL LOW (ref 30.0–36.0)
MCV: 92.1 fL (ref 78.0–100.0)
Platelets: 181 10*3/uL (ref 150–400)
RBC: 3.05 MIL/uL — ABNORMAL LOW (ref 4.22–5.81)
RDW: 14.8 % (ref 11.5–15.5)
WBC: 4.3 10*3/uL (ref 4.0–10.5)

## 2015-10-18 LAB — RENAL FUNCTION PANEL
Albumin: 2.4 g/dL — ABNORMAL LOW (ref 3.5–5.0)
Anion gap: 12 (ref 5–15)
BUN: 63 mg/dL — ABNORMAL HIGH (ref 6–20)
CO2: 22 mmol/L (ref 22–32)
Calcium: 7.5 mg/dL — ABNORMAL LOW (ref 8.9–10.3)
Chloride: 101 mmol/L (ref 101–111)
Creatinine, Ser: 6.34 mg/dL — ABNORMAL HIGH (ref 0.61–1.24)
GFR calc Af Amer: 11 mL/min — ABNORMAL LOW (ref >60)
GFR calc non Af Amer: 9 mL/min — ABNORMAL LOW (ref >60)
Glucose, Bld: 375 mg/dL — ABNORMAL HIGH (ref 65–99)
Phosphorus: 6.6 mg/dL — ABNORMAL HIGH (ref 2.5–4.6)
Potassium: 4.9 mmol/L (ref 3.5–5.1)
Sodium: 135 mmol/L (ref 135–145)

## 2015-10-18 LAB — GLUCOSE, CAPILLARY
Glucose-Capillary: 323 mg/dL — ABNORMAL HIGH (ref 65–99)
Glucose-Capillary: 311 mg/dL — ABNORMAL HIGH (ref 65–99)
Glucose-Capillary: 263 mg/dL — ABNORMAL HIGH (ref 65–99)
Glucose-Capillary: 332 mg/dL — ABNORMAL HIGH (ref 65–99)

## 2015-10-18 LAB — APTT
aPTT: 122 seconds — ABNORMAL HIGH (ref 24–37)
aPTT: 78 seconds — ABNORMAL HIGH (ref 24–37)
aPTT: 93 seconds — ABNORMAL HIGH (ref 24–37)
aPTT: 98 seconds — ABNORMAL HIGH (ref 24–37)

## 2015-10-18 LAB — PROTIME-INR
INR: 3.5 — ABNORMAL HIGH (ref 0.00–1.49)
Prothrombin Time: 34.4 seconds — ABNORMAL HIGH (ref 11.6–15.2)

## 2015-10-18 LAB — HEMOGLOBIN A1C
Hgb A1c MFr Bld: 7.7 % — ABNORMAL HIGH (ref 4.8–5.6)
Mean Plasma Glucose: 174 mg/dL

## 2015-10-18 MED ORDER — HYDROCODONE-ACETAMINOPHEN 5-325 MG PO TABS: 1.0000 | ORAL_TABLET | Freq: Four times a day (QID) | ORAL | Status: DC | PRN

## 2015-10-18 MED ORDER — PREGABALIN 100 MG PO CAPS
100.0000 mg | ORAL_CAPSULE | Freq: Every day | ORAL | Status: DC
  Administered 2015-10-18 – 2015-10-25 (×8): 100 mg via ORAL
  Filled 2015-10-18 (×8): qty 1

## 2015-10-18 MED ORDER — ARGATROBAN 50 MG/50ML IV SOLN
0.2200 ug/kg/min | INTRAVENOUS | Status: AC
  Administered 2015-10-18: 0.28 ug/kg/min via INTRAVENOUS
  Filled 2015-10-18: qty 50

## 2015-10-18 MED ORDER — INSULIN ASPART 100 UNIT/ML ~~LOC~~ SOLN
4.0000 [IU] | Freq: Three times a day (TID) | SUBCUTANEOUS | Status: DC
Start: 2015-10-19 — End: 2015-10-24
  Administered 2015-10-20 – 2015-10-24 (×11): 4 [IU] via SUBCUTANEOUS

## 2015-10-18 MED ORDER — INSULIN GLARGINE 100 UNIT/ML ~~LOC~~ SOLN
10.0000 [IU] | Freq: Every day | SUBCUTANEOUS | Status: DC
  Administered 2015-10-18: 10 [IU] via SUBCUTANEOUS
  Filled 2015-10-18 (×2): qty 0.1

## 2015-10-18 NOTE — Progress Notes (Addendum)
ANTICOAGULATION CONSULT NOTE - Follow Up Pharmacy Consult for Argatroban (Warfarin on hold) Indication: DVT  Patient Measurements: Height: 6' (182.9 cm) Weight: 285 lb 7.9 oz (129.5 kg) IBW/kg (Calculated) : 77.6  Vital Signs: Temp: 98.3 F (36.8 C) (01/06 1350) Temp Source: Oral (01/06 1350) BP: 135/45 mmHg (01/06 1350) Pulse Rate: 77 (01/06 1350)  Labs:  Recent Labs  10/16/15 0517 10/17/15 0645  10/17/15 2340 10/18/15 0853 10/18/15 1205 10/18/15 1650  HGB 9.6* 8.9*  --   --   --  8.4*  --   HCT 32.0* 29.1*  --   --   --  28.1*  --   PLT 216 179  --   --   --  181  --   APTT 51* 48*  < > 122*  --  98* 93*  LABPROT 26.1* 21.9*  --   --   --  34.4*  --   INR 2.42* 1.92*  --   --   --  3.50*  --   CREATININE 4.74* 4.41*  --   --  6.34*  --   --   < > = values in this interval not displayed.  Assessment: 50 yo M admitted 12/26 and found to have DVT left popliteal vein. VQ negative for PE.  Argatroban was started on 12/26  due to porcine allergy and patient was initiated on Coumadin.  However, admission has been complicated by AoCKD requiring HD 1/1, 1/2, and 1/4.  Coumadin has been stopped to allow INR to trend down in anticipation of HD access placement.  Argatroban restarted 1/6.  Pt was previously therapeutic on on 1.43mcg/kg/min however has been consistently above aPTT goal despite multiple rate reductions.   aPTT=91 on 0.28 mcg/kg/min, will decrease by 20% per protocol. Recheck tonight.  Goal of Therapy:  aPTT 50-90 seconds Monitor platelets by anticoagulation protocol: Yes    Plan:  Argatroban 0.22 mcg/kg/min - stop at 0200 1/7. Continue to hold Coumadin aPTT q2h until therapeutic x 2, then daily. Daily CBC and INR Follow-up plans for surgery  Erin Hearing PharmD., BCPS Clinical Pharmacist Pager [phone] 10/18/2015 5:57 PM  Addendum: aptt finally at goal on much lower dose than he originally needed, now running at a rate of 1.59ml/hr (0.45mcg/kg/min). No  bleeding complications noted. Will delay next lab draw to am labs.  10/18/2015 10:04 PM

## 2015-10-18 NOTE — Progress Notes (Signed)
CSW received consult regarding PT recommendation of SNF at discharge.  Patient is refusing SNF and wants to return home at discharge, continuing with home health.  CSW signing off.   Percell Locus Aliz Meritt LCSWA [phone]

## 2015-10-18 NOTE — Progress Notes (Signed)
ANTICOAGULATION CONSULT NOTE - Follow Up Consult  Pharmacy Consult for Argatroban (warfarin on hold) Indication: DVT  Allergies  Allergen Reactions  . Pork-Derived Products Anaphylaxis and Other (See Comments)    Fever also   . Ace Inhibitors Cough   Patient Measurements: Height: 6' (182.9 cm) Weight: 285 lb 7.9 oz (129.5 kg) IBW/kg (Calculated) : 77.6  Vital Signs: Temp: 98.2 F (36.8 C) (01/05 2215) Temp Source: Oral (01/05 2215) BP: 123/46 mmHg (01/06 0038) Pulse Rate: 82 (01/06 0038)  Labs:  Recent Labs  10/15/15 0335 10/15/15 0438 10/16/15 0517 10/17/15 0645 10/17/15 1434 10/17/15 1812 10/17/15 2340  HGB 6.6* 8.6* 9.6* 8.9*  --   --   --   HCT 21.7* 28.9* 32.0* 29.1*  --   --   --   PLT 150 197 216 179  --   --   --   APTT 42*  --  51* 48* 114* 95* 122*  LABPROT 26.4*  --  26.1* 21.9*  --   --   --   INR 2.46*  --  2.42* 1.92*  --   --   --   CREATININE 3.54*  --  4.74* 4.41*  --   --   --     Estimated Creatinine Clearance: 27.6 mL/min (by C-G formula based on Cr of 4.41).  Assessment: 50 yo M admitted 12/26 and found to have DVT left popliteal vein. VQ negative for PE. Argatroban was started on 12/26due to porcine allergy and patient was initiated on Coumadin. However, admission has been complicated by AoCKD requiring HD 1/1, 1/2, and 1/4. Coumadin has been stopped to allow INR to trend down in anticipation of HD access placement.  APTT is elevated tonight at 122 despite rate decrease  Goal of Therapy:  aPTT 50-90 seconds Monitor platelets by anticoagulation protocol: Yes   Plan:  -Decrease argatroban to 0.35 mcg/kg/min -0400 aPTT  Narda Bonds 10/18/2015,12:45 AM

## 2015-10-18 NOTE — Progress Notes (Signed)
Results for DUANE, SLEIGHT (MRN [id number]) as of 10/18/2015 10:48  Ref. Range 10/17/2015 08:04 10/17/2015 11:38 10/17/2015 16:35 10/17/2015 23:55 10/18/2015 08:02  Glucose-Capillary Latest Ref Range: 65-99 mg/dL 221 (H) 260 (H) 330 (H) 374 (H) 323 (H)  Noted that blood sugars are trending greater than 200 mg/dl. Recommend adding Lantus 40-45 units BID which would be half of dose of Toujeo 90 units BID at home. Continue Novolog MODERATE correction scale TID & HS and Novolog meal coverage TID. Will continue to monitor blood sugars while in hospital. Harvel Ricks RN BSN CDE

## 2015-10-18 NOTE — Consult Note (Signed)
Chief Complaint: Patient was seen in consultation today for tunneled hemodialysis catheter placement Chief Complaint  Patient presents with  . Shortness of Breath   at the request of Dr Lorrene Reid  Referring Physician(s): Dr Lorrene Reid  History of Present Illness: [name] [name] is a 51 y.o. male   LLE DVT---on Argatroban IV (this medicine gives false elevated INR result per Pharmacist) Admitted 10/07/15 with DVT and hypercarbic respiratory failure HTN All resolving and controlled CKD4 ; dialysis began 2005 Renal transplant 2009 and now failing Rt arm dialysis fistula placed 09/27/15 by Dr Sherilyn Banker Rt femoral dialysis catheter was placed 10/13/15 by Dr Jonnie Finner for urgent need  Developed 103 fever and leukocytosis Rt Femoral cath was removed 1/4 Now afeb; wbc wnl; BC neg to date Dialysis catheter holiday since 1/4 Now request made for new tunneled catheter to be placed for hemodialysis while waiting for Rt arm fistula to mature   Past Medical History  Diagnosis Date  . Hypertension   . Leg pain 02/05/11    with walking  . Weight gain   . Shortness of breath     when lying flat  . Diminished eyesight   . Hearing difficulty   . Anemia   . Nervousness(799.21)   . COPD (chronic obstructive pulmonary disease) ([place])   . CHF (congestive heart failure) ([place])   . Posttraumatic stress disorder   . Allergic rhinitis   . Dyslipidemia   . Morbid obesity ([place])   . Gait disorder   . Bilateral carpal tunnel syndrome   . Gastroparesis   . Gout   . Diabetic retinopathy ([place])   . Renal transplant, status post 04/05/2014  . Incomplete bladder emptying   . Urethral stricture   . Aortic insufficiency     a. Echo 7/16:  Mod LVH, EF 50-55%, mild to mod AI, severe LAE, PASP 57 mmHg (LV ID end diastolic [id number] mm)  . Hyperlipidemia   . Osteomyelitis ([place])   . Endocarditis   . Vitamin D deficiency   . Secondary hyperparathyroidism ([place])   . Rotator cuff disorder   . Discitis of lumbar  region     resolved  . Obstructive sleep apnea     not using CPAP- lost 100lbs  . Claustrophobia   . Diabetes mellitus     Type II  . Diabetic peripheral neuropathy ([place])   . GERD (gastroesophageal reflux disease)   . Sepsis ([place])     Postive blood cultures -   . Endocarditis 11/28/14  . Complication of anesthesia     "they said something at [facility] it was affected by sleep aqpnea."  . Chronic kidney disease     Kidney Transplant 2009 09/26/15- transplanated kidney failing  . Chronic renal allograft nephropathy   . Chronic kidney disease (CKD), stage IV (severe) ([place])   . DVT (deep venous thrombosis) ([place]) 10/07/2015    LLE    Past Surgical History  Procedure Laterality Date  . Kidney transplant  2009  . Cataract extraction w/ intraocular lens  implant, bilateral Bilateral   . Av fistula placement Bilateral 2015  . Arteriovenous graft placement Bilateral "several"  . Cystoscopy w/ internal urethrotomy  09/2014  . Bascilic vein transposition Right 09/27/2015  . Bascilic vein transposition Right 09/27/2015    Procedure: BASILIC VEIN TRANSPOSITION right;  Surgeon: Mal Misty, MD;  Location: [place];  Service: Vascular;  Laterality: Right;    Allergies: Pork-derived products and Ace inhibitors  Medications: Prior to Admission medications  Medication Sig Start Date End Date Taking? Authorizing Provider  aspirin EC 81 MG tablet Take 81 mg by mouth daily.   Yes Historical Provider, MD  calcitRIOL (ROCALTROL) 0.5 MCG capsule Take 1.5 mcg by mouth daily. Takes 3 tablets daily.   Yes Historical Provider, MD  clonazePAM (KLONOPIN) 0.5 MG tablet Take 1 mg by mouth at bedtime.    Yes Historical Provider, MD  cloNIDine (CATAPRES) 0.3 MG tablet Take 0.3 mg by mouth 3 (three) times daily.    Yes Historical Provider, MD  Febuxostat (ULORIC) 80 MG TABS Take 80 mg by mouth daily.   Yes Historical Provider, MD  fluconazole (DIFLUCAN) 50 MG tablet Take 50 mg by mouth every Monday,  Wednesday, and Friday.    Yes Historical Provider, MD  furosemide (LASIX) 80 MG tablet Take 2 tablets by mouth 2 (two) times daily. 12/04/14  Yes Historical Provider, MD  HYDROcodone-acetaminophen (NORCO/VICODIN) 5-325 MG tablet Take 1 tablet by mouth every 6 (six) hours as needed for severe pain. 09/27/15  Yes Samantha J Rhyne, PA-C  Insulin Glargine (TOUJEO SOLOSTAR) 300 UNIT/ML SOPN Inject 90 Units into the skin 2 (two) times daily. 08/26/15  Yes Elayne Snare, MD  insulin regular human CONCENTRATED (HUMULIN R U-500 KWIKPEN) 500 UNIT/ML kwikpen Inject 10 Units into the skin once. U-500 insulin 10 before bfst, 25 before lunch and 20 before dinner, take 30 min before Sliding scale use for high sugars:  Sugar 150-199 take 2 units, 200-250= 4 units and over 250 take 6 units 08/26/15  Yes Elayne Snare, MD  mycophenolate (CELLCEPT) 250 MG capsule Take 750 mg by mouth 2 (two) times daily.    Yes Historical Provider, MD  omeprazole (PRILOSEC) 20 MG capsule Take 20 mg by mouth daily.   Yes Historical Provider, MD  predniSONE (DELTASONE) 5 MG tablet Take 5 mg by mouth daily.     Yes Historical Provider, MD  pregabalin (LYRICA) 100 MG capsule Take 100 mg by mouth every evening.    Yes Historical Provider, MD  rosuvastatin (CRESTOR) 20 MG tablet Take 1 tablet (20 mg total) by mouth at bedtime. 04/11/15  Yes Hoyt Koch, MD  tacrolimus (PROGRAF) 0.5 MG capsule Take 2.5 mg by mouth 2 (two) times daily. Takes 5 capsules 2 times a day   Yes Historical Provider, MD  tadalafil (CIALIS) 5 MG tablet Take 5 mg by mouth daily as needed for erectile dysfunction.   Yes Historical Provider, MD  valGANciclovir (VALCYTE) 450 MG tablet Take 450 mg by mouth. Takes 1 tablet  M/W/F.   Yes Historical Provider, MD  ACCU-CHEK FASTCLIX LANCETS MISC Use to check blood sugar 4 times per day dx code E11.9 04/09/15   Elayne Snare, MD  diazepam (VALIUM) 5 MG tablet Take 1 tablet (5 mg total) by mouth 2 (two) times daily. 09/14/15   Ozella Almond Ward, PA-C  glucose blood (ACCU-CHEK SMARTVIEW) test strip Use as instructed to check blood sugar 4 times per day dx code E11.9 06/25/15   Elayne Snare, MD  Insulin Pen Needle (BD PEN NEEDLE NANO U/F) 32G X 4 MM MISC Use 5 per day 05/16/15   Elayne Snare, MD  ondansetron (ZOFRAN) 8 MG tablet Take 1 tablet (8 mg total) by mouth every 8 (eight) hours as needed for nausea or vomiting. 09/14/15   Jola Schmidt, MD  sulfamethoxazole-trimethoprim (BACTRIM,SEPTRA) 400-80 MG tablet Take 1 tablet by mouth 3 (three) times a week. Takes 1 tablet M/W/F.    Historical Provider, MD  Family History  Problem Relation Age of Onset  . Hypertension Mother   . Diabetes Father     Social History   Social History  . Marital Status: Divorced    Spouse Name: N/A  . Number of Children: 0  . Years of Education: College    Occupational History  .     Social History Main Topics  . Smoking status: Never Smoker   . Smokeless tobacco: Never Used  . Alcohol Use: No  . Drug Use: No  . Sexual Activity: Not Currently   Other Topics Concern  . None   Social History Narrative   Patient is Divorced.   Patient does not have any children.   Patient is right-handed   Patient is currently in college working on his Ph.D.          Review of Systems: A 12 point ROS discussed and pertinent positives are indicated in the HPI above.  All other systems are negative.  Review of Systems  Constitutional: Positive for activity change and appetite change. Negative for fever and fatigue.  Respiratory: Negative for shortness of breath.   Genitourinary: Positive for decreased urine volume.  Neurological: Positive for weakness.  Psychiatric/Behavioral: Negative for behavioral problems and confusion.    Vital Signs: BP 135/45 mmHg  Pulse 77  Temp(Src) 98.3 F (36.8 C) (Oral)  Resp 16  Ht 6' (1.829 m)  Wt 285 lb 7.9 oz (129.5 kg)  BMI 38.71 kg/m2  SpO2 94%  Physical Exam  Constitutional: He is oriented to  person, place, and time.  Cardiovascular: Normal rate, regular rhythm and normal heart sounds.   Pulmonary/Chest: Effort normal and breath sounds normal. He has no wheezes.  Abdominal: Soft. Bowel sounds are normal. There is no tenderness.  Musculoskeletal: Normal range of motion.  Rt arm dialysis fistula Good thrill; good pulse  Neurological: He is alert and oriented to person, place, and time.  Skin: Skin is warm and dry.  Psychiatric: He has a normal mood and affect. His behavior is normal. Judgment and thought content normal.  Nursing note and vitals reviewed.   Mallampati Score:  MD Evaluation Airway: WNL Heart: WNL Abdomen: WNL Chest/ Lungs: WNL ASA  Classification: 3 Mallampati/Airway Score: One  Imaging: Dg Chest 2 View  10/17/2015  CLINICAL DATA:  Fever and weakness. EXAM: CHEST  2 VIEW COMPARISON:  10/15/2015, 06/04/2011 and 12/12/2007 FINDINGS: Patient slightly rotated to the left. Lungs are adequately inflated demonstrate no focal consolidation or effusion. There is prominence of the perihilar markings suggesting mild vascular congestion. There is mild stable cardiomegaly. Degenerative changes of the spine. Remainder the exam is unchanged. IMPRESSION: Mild stable cardiomegaly with evidence of mild vascular congestion. Electronically Signed   By: Marin Olp M.D.   On: 10/17/2015 09:04   Dg Chest 2 View  10/07/2015  CLINICAL DATA:  Pt reports shortness of breath progressively worse x 3 days, worse at night. Pt had surgery on 12/16 for dialysis fistula; h/o HTN and DM; non-smoker EXAM: CHEST - 2 VIEW COMPARISON:  01/15/2014 FINDINGS: Moderate cardiomegaly. New central pulmonary vascular congestion. Bilateral interstitial edema or infiltrates, new since prior study. Small bilateral pleural effusions, new since previous exam. No pneumothorax. Visualized skeletal structures are unremarkable. IMPRESSION: 1. Cardiomegaly with new small effusions, bilateral interstitial edema and  vascular congestion suggesting CHF. Electronically Signed   By: Lucrezia Europe M.D.   On: 10/07/2015 12:10   Ct Head Wo Contrast  10/13/2015  CLINICAL DATA:  Diminished eye site,  hearing difficulty, anemia. Gait disorder. PTSD. EXAM: CT HEAD WITHOUT CONTRAST TECHNIQUE: Contiguous axial images were obtained from the base of the skull through the vertex without intravenous contrast. COMPARISON:  None. FINDINGS: There is no intra or extra-axial fluid collection or mass lesion. The basilar cisterns and ventricles have a normal appearance. There is no CT evidence for acute infarction or hemorrhage. Bone windows show no acute abnormality. There is atherosclerotic calcification of the internal carotid arteries. IMPRESSION: No evidence for acute intracranial abnormality. Electronically Signed   By: Nolon Nations M.D.   On: 10/13/2015 10:58   US Renal Transplant W/doppler  10/14/2015  CLINICAL DATA:  Progressive chronic kidney disease requiring hemodialysis. Status post renal transplant in 2009. EXAM: ULTRASOUND OF RENAL TRANSPLANT WITH DOPPLER TECHNIQUE: Ultrasound examination of the renal transplant was performed with gray-scale, color and duplex doppler evaluation. COMPARISON:  None. FINDINGS: Transplant kidney location:  Right lower quadrant. Transplant kidney: Length: 11.5 cm. Morphology of the renal transplant appears abnormal with somewhat poor delineation of contours and increase in cortical echogenicity. Part of the difficulty in delineate contours is felt to be due to some adjacent bowel gas. There may be mild prominence of the collecting system in one pole without overt hydronephrosis. Color flow in the main renal artery:  Present Color flow in the main renal vein:  Present Duplex Doppler Evaluation Resistive index in main renal artery: 1.0. Waveform appears to be of high resistance which may be reflective of transplant rejection. Venous waveform in main renal vein:  Present Resistive index in upper pole  intrarenal artery:  0.9 (normal 0.6-0.8; equivocal 0.8-0.9; abnormal > 0.9) Resistive index in lower pole intrarenal artery: 0.8 (normal 0.6-0.8; equivocal 0.8-0.9; abnormal > 0.9) Bladder: Decompressed by Foley catheter. Other findings: No peri-transplant fluid collections identified by ultrasound. IMPRESSION: Abnormal sonographic appearance of the right lower quadrant renal transplant with poor delineation of contours and increased cortical echogenicity. Mild prominence of the collecting system in one pole without overt hydronephrosis. Arterial resistive indices are elevated or borderline with waveforms consistent with high resistance pattern. These findings are suggestive of transplant rejection. Electronically Signed   By: Aletta Edouard M.D.   On: 10/14/2015 09:37   Nm Pulmonary Perf And Vent  10/08/2015  CLINICAL DATA:  50 year old male with shortness of breath and known DVT. EXAM: NUCLEAR MEDICINE VENTILATION - PERFUSION LUNG SCAN TECHNIQUE: Ventilation images were obtained in multiple projections using inhaled aerosol Tc-75m DTPA. Perfusion images were obtained in multiple projections after intravenous injection of Tc-14m MAA. RADIOPHARMACEUTICALS:  0000000 millicuries [id number] DTPA aerosol inhalation and 4.1 millicurie [id number] MAA IV COMPARISON:  10/07/2015 chest radiograph FINDINGS: Ventilation: No focal ventilation defect. Perfusion: No wedge shaped peripheral perfusion defects to suggest acute pulmonary embolism. IMPRESSION: Unremarkable exam.  No evidence of pulmonary emboli. Electronically Signed   By: Margarette Canada M.D.   On: 10/08/2015 15:00   Dg Chest Port 1 View  10/15/2015  CLINICAL DATA:  Pulmonary edema. EXAM: PORTABLE CHEST 1 VIEW COMPARISON:  10/14/2015. FINDINGS: Mediastinum hilar structures stable. Cardiomegaly with pulmonary venous congestion. Interim improvement of bilateral pulmonary infiltrates/ edema. Small left pleural effusion cannot be excluded. No pneumothorax.  IMPRESSION: Improving congestive heart failure. Mild persistent pulmonary edema. Small left pleural effusion cannot be excluded. Electronically Signed   By: Marcello Moores  Register   On: 10/15/2015 07:30   Dg Chest Port 1 View  10/14/2015  CLINICAL DATA:  50 year old male with history of respiratory failure. EXAM: PORTABLE CHEST 1 VIEW COMPARISON:  Chest x-ray 10/13/2015.  FINDINGS: Low lung volumes. Bibasilar opacities favored to predominantly reflect atelectasis. Moderate pulmonary edema. Small bilateral pleural effusions. Mild cardiomegaly. Upper mediastinal contours are distorted by patient's positioning and rotation to the right. IMPRESSION: 1. The appearance the chest suggests congestive heart failure, as above. Electronically Signed   By: Vinnie Langton M.D.   On: 10/14/2015 07:57   Dg Chest Port 1 View  10/13/2015  CLINICAL DATA:  Pt has kidney failure, currently on BiPAP for decreased O2 levels EXAM: PORTABLE CHEST 1 VIEW COMPARISON:  10/07/2015 FINDINGS: Heart is enlarged. Significant pulmonary edema identified bilaterally. More confluent opacity is identified at the left lung base, consistent with edema or infiltrate. IMPRESSION: Pulmonary edema. Left lower lobe edema and/or infiltrate. Electronically Signed   By: Nolon Nations M.D.   On: 10/13/2015 12:55   Dg Abd Portable 1v  10/14/2015  CLINICAL DATA:  Vomiting.  Possible small bowel obstruction. EXAM: PORTABLE ABDOMEN - 1 VIEW COMPARISON:  None. FINDINGS: Two supine views. Degraded exam secondary to patient body habitus. Right femoral line. No bowel distension. Left-sided colonic stool and gas. IMPRESSION: Limited exam, demonstrating no evidence of bowel obstruction. Electronically Signed   By: Abigail Miyamoto M.D.   On: 10/14/2015 18:54    Labs:  CBC:  Recent Labs  10/15/15 0438 10/16/15 0517 10/17/15 0645 10/18/15 1205  WBC 6.4 14.7* 11.3* 4.3  HGB 8.6* 9.6* 8.9* 8.4*  HCT 28.9* 32.0* 29.1* 28.1*  PLT 197 216 179 181     COAGS:  Recent Labs  10/15/15 0335 10/16/15 0517 10/17/15 0645 10/17/15 1434 10/17/15 1812 10/17/15 2340 10/18/15 1205  INR 2.46* 2.42* 1.92*  --   --   --  3.50*  APTT 42* 51* 48* 114* 95* 122* 98*    BMP:  Recent Labs  10/15/15 0335 10/16/15 0517 10/17/15 0645 10/18/15 0853  NA 141 140 136 135  K 4.8 4.9 4.5 4.9  CL 104 102 100* 101  CO2 27 26 27 22   GLUCOSE 159* 233* 276* 375*  BUN 51* 64* 46* 63*  CALCIUM 7.5* 7.8* 7.7* 7.5*  CREATININE 3.54* 4.74* 4.41* 6.34*  GFRNONAA 19* 13* 14* 9*  GFRAA 21* 15* 16* 11*    LIVER FUNCTION TESTS:  Recent Labs  10/24/14 1045  06/19/15 1105  10/15/15 0335 10/16/15 0517 10/17/15 0645 10/18/15 0853  BILITOT 1.1  --  0.9  --  1.2  --   --   --   AST 15  --  12  --  15  --   --   --   ALT 12  --  8  --  11*  --   --   --   ALKPHOS 88  --  76  --  81  --   --   --   PROT 6.3  --  6.0  --  4.6*  --   --   --   ALBUMIN 2.8*  < > 3.3*  < > 2.5* 2.5* 2.1* 2.4*  < > = values in this interval not displayed.  TUMOR MARKERS: No results for input(s): AFPTM, CEA, CA199, CHROMGRNA in the last 8760 hours.  Assessment and Plan:  Progressive chronic kidney disease Temp cath placed 1/1 - Rt femoral Vein-- developed fever and high wbc Removed 1/4 Afeb; wbc wnl; BC neg to date Now scheduled for tunneled hemodialysis catheter placement Risks and Benefits discussed with the patient including, but not limited to bleeding, infection, vascular injury, pneumothorax which may require chest tube placement,  air embolism or even death All of the patient's questions were answered, patient is agreeable to proceed. Consent signed and in chart.  INR 3.5 today On Argatroban IV---pharmacy reports this medicine gives false elevated INR result. Argatroban to be turned off 200 am 1/7 Redraw INR at 8am stat  Thank you for this interesting consult.  I greatly enjoyed meeting Ryo Garr and look forward to participating in their care.  A  copy of this report was sent to the requesting provider on this date.  Signed: Marlaine Arey A 10/18/2015, 2:26 PM   I spent a total of 20 Minutes    in face to face in clinical consultation, greater than 50% of which was counseling/coordinating care for tunneled dialysis catheter placement

## 2015-10-18 NOTE — Progress Notes (Signed)
Pt had a coude catheter placed on 1/5. Pt is having hematuria and scant bleeding from the penis from the attempts of inserting a foley catheter prior to the coude catheter being inserted. Pt is on a argatroban infusion and the rate has been lowered from 5.4 mL/hr to 2.7 mL/hr by pharmacy. Will continue to monitor.

## 2015-10-18 NOTE — Progress Notes (Signed)
[facility] PROGRESS NOTE  [name] [name] [id number] DOB: 12/30/1965 DOA: 10/07/2015 PCP: Hoyt Koch, MD Brief history: 50 y.o. male with multiple medical problems not limited to renal transplant, chronic kidney disease, diabetes and obesity, a new HD  dialysis fistula placed 09/28/15, presents with left lower extremity swelling and altered mental status.  Assessment/Plan: 1. Acute hypercarbic respiratory failure: - resolved. Wean him off oxygen as appropriate.   2. Hypertension: Better controlled. Resume home meds.   3. STAGE 4 ckd  NEW HD pt.  S/p renal transplant.  Hyperkalemia resolved.  Resume prograf cell cept and prednisone.  HD catheter placement as per renal probably tomorrow by IR.  Post HD femoral HD catheter is removed yesterday.  Clip in process.   4. Anemia of chronic disease: - stable.   5. Type 2 DM: CBG (last 3)   Recent Labs  10/18/15 0802 10/18/15 1209 10/18/15 1700  GLUCAP 323* 311* 263*    Resume SSI.  Increased to mod scale.  hgba1c 7.7 Added 4 units of novolog tidac to the mod scale.  Probably from the prednisone.   6. Urinary retention: probably from urethral stricture, would keep the foley in place and outpatient urology work up.  Foley insertion.   7. Fever: blood cultures drawn on 1/5 after the HD and was empirically started on vancomycin and zosyn. CXR shows mild vascular congestion.   8. DVT of the LLE on argatroban, with INR of around 3, slightly elevated.   9. Acute encephalopathy: resolved with resolution of th e respiratory issues.    10. Acute on chronic diastolic heart failure: Appears compensated .    11. COPD: No wheezing heard. Stable.       Code Status: full code.  Family Communication: none at bedside.  Disposition Plan: SNF when CLIP in place.    Consultants:  PCCM  Renal.   Procedures:  HD 1/4  Antibiotics:  none  HPI/Subjective: Breathing improved. No new complaints.    Objective: Filed Vitals:   10/18/15 1207 10/18/15 1350  BP: 141/54 135/45  Pulse: 73 77  Temp:  98.3 F (36.8 C)  Resp:  16    Intake/Output Summary (Last 24 hours) at 10/18/15 1723 Last data filed at 10/18/15 1352  Gross per 24 hour  Intake    420 ml  Output    400 ml  Net     20 ml   Filed Weights   10/15/15 1848 10/16/15 1330 10/16/15 1740  Weight: 131.77 kg (290 lb 8 oz) 131.3 kg (289 lb 7.4 oz) 129.5 kg (285 lb 7.9 oz)    Exam:   General:  Alert comfortable.   Cardiovascular: s1s2  Respiratory: diminished at bases.   Abdomen soft NT nd bs+  Musculoskeletal: pedal edema.   Data Reviewed: Basic Metabolic Panel:  Recent Labs Lab 10/14/15 0500 10/15/15 0335 10/16/15 0517 10/17/15 0645 10/18/15 0853  NA 142 141 140 136 135  K 5.0 4.8 4.9 4.5 4.9  CL 106 104 102 100* 101  CO2 26 27 26 27 22   GLUCOSE 166* 159* 233* 276* 375*  BUN 73* 51* 64* 46* 63*  CREATININE 4.19* 3.54* 4.74* 4.41* 6.34*  CALCIUM 7.3* 7.5* 7.8* 7.7* 7.5*  PHOS  --  5.2* 4.9* 5.7* 6.6*   Liver Function Tests:  Recent Labs Lab 10/15/15 0335 10/16/15 0517 10/17/15 0645 10/18/15 0853  AST 15  --   --   --   ALT 11*  --   --   --  ALKPHOS 81  --   --   --   BILITOT 1.2  --   --   --   PROT 4.6*  --   --   --   ALBUMIN 2.5* 2.5* 2.1* 2.4*   No results for input(s): LIPASE, AMYLASE in the last 168 hours.  Recent Labs Lab 10/13/15 0815  AMMONIA 37*   CBC:  Recent Labs Lab 10/14/15 0500 10/15/15 0335 10/15/15 0438 10/16/15 0517 10/17/15 0645 10/18/15 1205  WBC 6.9 5.2 6.4 14.7* 11.3* 4.3  NEUTROABS 5.8  --   --   --   --   --   HGB 9.2* 6.6* 8.6* 9.6* 8.9* 8.4*  HCT 31.4* 21.7* 28.9* 32.0* 29.1* 28.1*  MCV 91.5 90.0 90.9 89.9 91.8 92.1  PLT 233 150 197 216 179 181   Cardiac Enzymes: No results for input(s): CKTOTAL, CKMB, CKMBINDEX, TROPONINI in the last 168 hours. BNP (last 3 results)  Recent Labs  10/07/15 1145  BNP 1430.7*    ProBNP (last 3  results) No results for input(s): PROBNP in the last 8760 hours.  CBG:  Recent Labs Lab 10/17/15 1635 10/17/15 2355 10/18/15 0802 10/18/15 1209 10/18/15 1700  GLUCAP 330* 374* 323* 311* 263*    Recent Results (from the past 240 hour(s))  MRSA PCR Screening     Status: Abnormal   Collection Time: 10/13/15 11:28 AM  Result Value Ref Range Status   MRSA by PCR POSITIVE (A) NEGATIVE Final    Comment:        The GeneXpert MRSA Assay (FDA approved for NASAL specimens only), is one component of a comprehensive MRSA colonization surveillance program. It is not intended to diagnose MRSA infection nor to guide or monitor treatment for MRSA infections. RESULT CALLED TO, READ BACK BY AND VERIFIED WITH: C. SCHILLER RN 10/13/15 AT 1300 BY A. DAVIS   Culture, blood (Routine X 2) w Reflex to ID Panel     Status: None (Preliminary result)   Collection Time: 10/16/15  2:15 PM  Result Value Ref Range Status   Specimen Description BLOOD HEMODIALYSIS CATHETER  Final   Special Requests BOTTLES DRAWN AEROBIC AND ANAEROBIC 10CC  Final   Culture NO GROWTH 2 DAYS  Final   Report Status PENDING  Incomplete  Culture, blood (Routine X 2) w Reflex to ID Panel     Status: None (Preliminary result)   Collection Time: 10/16/15  2:30 PM  Result Value Ref Range Status   Specimen Description BLOOD HEMODIALYSIS CATHETER  Final   Special Requests BOTTLES DRAWN AEROBIC AND ANAEROBIC 10CC  Final   Culture NO GROWTH 2 DAYS  Final   Report Status PENDING  Incomplete     Studies: Dg Chest 2 View  10/17/2015  CLINICAL DATA:  Fever and weakness. EXAM: CHEST  2 VIEW COMPARISON:  10/15/2015, 06/04/2011 and 12/12/2007 FINDINGS: Patient slightly rotated to the left. Lungs are adequately inflated demonstrate no focal consolidation or effusion. There is prominence of the perihilar markings suggesting mild vascular congestion. There is mild stable cardiomegaly. Degenerative changes of the spine. Remainder the exam is  unchanged. IMPRESSION: Mild stable cardiomegaly with evidence of mild vascular congestion. Electronically Signed   By: Marin Olp M.D.   On: 10/17/2015 09:04    Scheduled Meds: . anticoagulant sodium citrate  5 mL Intravenous Once  . aspirin EC  81 mg Oral Daily  . calcitRIOL  2 mcg Oral Daily  . calcium acetate  1,334 mg Oral TID WC  .  cloNIDine  0.3 mg Transdermal Weekly  . febuxostat  80 mg Oral Daily  . insulin aspart  0-15 Units Subcutaneous TID WC  . insulin aspart  0-5 Units Subcutaneous QHS  . insulin aspart  2 Units Subcutaneous TID WC  . labetalol  100 mg Oral TID  . mycophenolate  750 mg Oral BID  . pantoprazole  40 mg Oral Daily  . piperacillin-tazobactam (ZOSYN)  IV  2.25 g Intravenous 3 times per day  . polyethylene glycol  17 g Oral BID  . predniSONE  5 mg Oral Q breakfast  . pregabalin  100 mg Oral QHS  . rosuvastatin  20 mg Oral QHS  . sodium chloride  3 mL Intravenous Q12H  . tacrolimus  1 mg Oral BID   Continuous Infusions: . argatroban 0.28 mcg/kg/min (10/18/15 1707)    Principal Problem:   Deep vein thrombosis (DVT) of left lower extremity (HCC) Active Problems:   Obstructive sleep apnea   KIDNEY TRANSPLANTATION, HX OF   Benign essential HTN   H/O kidney transplant   Diabetes mellitus, type 2 (HCC)   Diabetic peripheral neuropathy (HCC)   Chronic kidney disease (CKD), stage IV (severe) (HCC)   CHF (congestive heart failure) (HCC)   Hyperkalemia   Prolonged Q-T interval on ECG   Chronic deep vein thrombosis (DVT) of left lower extremity ([place])   Acute on chronic congestive heart failure ([place])   Acute pulmonary edema ([place])    Time spent: 25 min    [facility] Pager [phone]. If 7PM-7AM, please contact night-coverage at [email or web address], password [facility] 10/18/2015, 5:23 PM  LOS: 11 days

## 2015-10-18 NOTE — Progress Notes (Signed)
[facility] Rounding Note  Subjective:   Required placement of fem HD cath (1/1) with HD 1/1, 1/2, 1/4   Foley removed by primary team - pt was unable to void - bladder scan >300 - unable to place foley - coude catheter  Afebrile past 24 hours IR approved for Saturday placement of TDC No INR done today   Physical Exam:  BP 179/45 mmHg  Pulse 87  Temp(Src) 97.8 F (36.6 C) (Oral)  Resp 14  Ht 6' (1.829 m)  Wt 129.5 kg (285 lb 7.9 oz)  BMI 38.71 kg/m2  SpO2 95%  CV: S1S2 No S3 Lungs anteriorly clear ABD: Non-tender R renal allograft. + BS. EXT:No RLE edema,. 1+ LLE RUE BVT AVF: +B/T (09/27/15)   Scheduled Meds: . anticoagulant sodium citrate  5 mL Intravenous Once  . aspirin EC  81 mg Oral Daily  . calcitRIOL  2 mcg Oral Daily  . calcium acetate  1,334 mg Oral TID WC  . cloNIDine  0.3 mg Transdermal Weekly  . febuxostat  80 mg Oral Daily  . insulin aspart  0-15 Units Subcutaneous TID WC  . insulin aspart  0-5 Units Subcutaneous QHS  . insulin aspart  2 Units Subcutaneous TID WC  . labetalol  100 mg Oral TID  . mycophenolate  750 mg Oral BID  . pantoprazole  40 mg Oral Daily  . piperacillin-tazobactam (ZOSYN)  IV  2.25 g Intravenous 3 times per day  . polyethylene glycol  17 g Oral BID  . predniSONE  5 mg Oral Q breakfast  . rosuvastatin  20 mg Oral QHS  . sodium chloride  3 mL Intravenous Q12H  . tacrolimus  1 mg Oral BID   Infusions: . argatroban 0.35 mcg/kg/min (10/18/15 0056)   PRN Meds:.sodium chloride, sodium chloride, sodium chloride, acetaminophen **OR** acetaminophen, bisacodyl, hydrALAZINE, labetalol, morphine injection, ondansetron **OR** ondansetron (ZOFRAN) IV, promethazine, sodium chloride, technetium TC 73M diethylenetriame-pentaacetic acid    Recent Labs Lab 10/16/15 0517 10/17/15 0645 10/18/15 0853  NA 140 136 135  K 4.9 4.5 4.9  CL 102 100* 101  CO2 26 27 22   GLUCOSE 233* 276* 375*  BUN 64* 46* 63*  CREATININE 4.74* 4.41*  6.34*  CALCIUM 7.8* 7.7* 7.5*  PHOS 4.9* 5.7* 6.6*    Recent Labs Lab 10/14/15 0500  10/15/15 0438 10/16/15 0517 10/17/15 0645  WBC 6.9  < > 6.4 14.7* 11.3*  NEUTROABS 5.8  --   --   --   --   HGB 9.2*  < > 8.6* 9.6* 8.9*  HCT 31.4*  < > 28.9* 32.0* 29.1*  MCV 91.5  < > 90.9 89.9 91.8  PLT 233  < > 197 216 179  < > = values in this interval not displayed.  Lab Results  Component Value Date   INR 1.92* 10/17/2015   INR 2.42* 10/16/2015   INR 2.46* 10/15/2015   1/4 BC's negative to date 1/2 UC neg  Results for BLAIR, BRULL (MRN [id number]) as of 10/17/2015 12:35  Ref. Range 10/16/2015 05:17  Iron Latest Ref Range: 45-182 ug/dL 6 (L)  UIBC Latest Units: ug/dL 218  TIBC Latest Ref Range: 250-450 ug/dL 224 (L)  Saturation Ratios Latest Ref Range: 17.9-39.5 % 3 (L)   10/15/15 CXR IMPRESSION: Improving congestive heart failure. Mild persistent pulmonary edema. Small left pleural effusion cannot be excluded 1/4 CXR IMPRESSION: Mild stable cardiomegaly with evidence of mild vascular congestion.  Background: 55M with CKD4T (failing renal allograft d/t CAN/recent  ACR with baseline creatinine PTA around 3.6, progressive), admit with acute LLE DVT . Renal function worsened with diuresis, continued to worsen with hyperkalemia, resp fx despite holding diuretics for 2 days, requiring acute HD (1st 1/1) d/t vol overload, hyperkalemia, hypercarbic resp fx.   ASSESSMENT/RECOMMENDATIONS  1. Progressive CKD4T with AKI, ddKT 2009 [facility], now with CAN, recent ACR (tx with IVIG and pulse steroids); ? AKI 2/2 diuresis, worsened 10/13/15 with hyperkalemia,. Required HD 1/1, 1/2, 1/4  via temp fem cath. Volume and numbers better.  I am thinking that he will remain HD dependent-->new ESRD. Concerned about fever so pulled temp cath after HD 1/4. Pt afebrile. Blood cultures neg to date. IR has approved for Saturday placement of TDC.  Continuing to hold coumadin - on argatroban - this will need to be held for  the procedure. No INR done today.  2. Chronic immunosuppression on Tac/MMF/Pred; Fluc/Ganciclovir (TMP/SMX has been stopped). TAC was empirically reduced from 2.5mg  BID to 1mg  BID on 1/1 (appropriately so as TAC level came back at 12)  3. Hypertension - Improved with meds and volume removal. Med held last night - BP up this AM 4. Acute LLE DVT Argatroban. Was transitioning to warfarin; VQ negative. See above discussion - continue to hold coumadin - can resume after [facility] on Saturday.   5. AMS - uremic + hypercarbic resp failure. resolved 6. 2HPTH on VDRA. PTH 408. Calcitriol dose increased to 2 mcg/day. Added ca acetate. 7. Porcine Allergy 8. Anemia - TSat low.Will wait on final BC results before starting IV Fe. Darbe with next HD 9. CHF - markedly improved with volume removal.  10. COPD 11. DM 12. Aortic Insufficiency (hx/o Aortic IE 11/2014 Enteroccoccus  Faecalis) 13. Gout on febuxostat 14. S/p R BVT 09/27/2015 +B/T 15. Hx/o urethral stricture - has historically been issue w/his voiding problem. May need to ask Urology to see (they have seen him in the past)   16. FEVER AND LEUKOCYTOSIS 10/15/14. HAD ENTEROCOCCAL ENDOCARDITIS AND OSTEOMYELITIS THIS PAST YEAR. HAS AORTIC VALVE  INSUFF. PANCULTURED, STARTED EMPIRIC VANCO AND ZOSYN PER PHARMACY, PULLED FEM LINE AFTER HD (NOTE HE DOES NOT HAVE A PCN ALLERGY) Afebrile past 24 hours, WBC falling. Cultures neg to date   Jamal Maes, MD [facility] [phone] Pager 10/18/2015, 10:22 AM

## 2015-10-18 NOTE — Care Management Note (Signed)
Case Management Note  Patient Details  Name: Hau Hudecek MRN: [id number] Date of Birth: [birth year]  Subjective/Objective:  Patient is for cathter tomorrow by IR for HD, patient will need to be clipped, and inr will need to be therapeutic before discharge.                 Action/Plan:   Expected Discharge Date:                  Expected Discharge Plan:  Skilled Nursing Facility  In-House Referral:  Clinical Social Work  Discharge planning Services  CM Consult  Post Acute Care Choice:    Choice offered to:     DME Arranged:    DME Agency:     HH Arranged:    [place] Agency:     Status of Service:  In process, will continue to follow  Medicare Important Message Given:  Yes Date Medicare IM Given:    Medicare IM give by:    Date Additional Medicare IM Given:    Additional Medicare Important Message give by:     If discussed at [place] of Stay Meetings, dates discussed:    Additional Comments:  Zenon Mayo, RN 10/18/2015, 2:52 PM

## 2015-10-18 NOTE — Progress Notes (Signed)
Paged MD about giving scheduled 100mg  of Labetalol and pt's BP 129/38. MD placed comments to hold BP medication if SBP<150. BP medication held. Will carry out orders at this time.

## 2015-10-18 NOTE — Progress Notes (Signed)
ANTICOAGULATION CONSULT NOTE - Follow Up Pharmacy Consult for Argatroban (Warfarin on hold) Indication: DVT  Patient Measurements: Height: 6' (182.9 cm) Weight: 285 lb 7.9 oz (129.5 kg) IBW/kg (Calculated) : 77.6  Vital Signs: Temp: 98.3 F (36.8 C) (01/06 1350) Temp Source: Oral (01/06 1350) BP: 135/45 mmHg (01/06 1350) Pulse Rate: 77 (01/06 1350)  Labs:  Recent Labs  10/16/15 0517 10/17/15 0645  10/17/15 1812 10/17/15 2340 10/18/15 0853 10/18/15 1205  HGB 9.6* 8.9*  --   --   --   --  8.4*  HCT 32.0* 29.1*  --   --   --   --  28.1*  PLT 216 179  --   --   --   --  181  APTT 51* 48*  < > 95* 122*  --  98*  LABPROT 26.1* 21.9*  --   --   --   --  34.4*  INR 2.42* 1.92*  --   --   --   --  3.50*  CREATININE 4.74* 4.41*  --   --   --  6.34*  --   < > = values in this interval not displayed.  Assessment: 50 yo M admitted 12/26 and found to have DVT left popliteal vein. VQ negative for PE.  Argatroban was started on 12/26  due to porcine allergy and patient was initiated on Coumadin.  However, admission has been complicated by AoCKD requiring HD 1/1, 1/2, and 1/4.  Coumadin has been stopped to allow INR to trend down in anticipation of HD access placement.  Argatroban restarted 1/6.  Pt was previously therapeutic on on 1.65mcg/kg/min however has been consistently above aPTT goal despite multiple rate reductions.   aPTT=98 on 0.35 mcg/kg/min, will decrease by 20% per protocol.  Of note, Argatroban does falsely elevate INR so INR may appear to be rising initially despite the fact that Coumadin is on hold.  I have communicated this information to Dr.Dunham (renal) and Jannifer Franklin (IR) with plans to stop Argatroban at 0200 1/7 and check true INR 0800 1/7 in order to proceed with permcath placement 0900 1/7.  Goal of Therapy:  aPTT 50-90 seconds Monitor platelets by anticoagulation protocol: Yes    Plan:  Argatroban 0.28 mcg/kg/min - stop at 0200 1/7. Continue to hold  Coumadin aPTT q2h until therapeutic x 2, then daily. Daily CBC and INR Follow-up plans for surgery  Horton Chin, Pharm.D., BCPS Clinical Pharmacist Pager [phone] 10/18/2015 2:08 PM

## 2015-10-18 NOTE — Progress Notes (Signed)
Received a call from telemetry, pt had a 14-beat run of v-tach. Assessed pt at that time, pt was asymptomatic with no chest pain or SOB. MD notified. Will continue to monitor.

## 2015-10-19 ENCOUNTER — Inpatient Hospital Stay (HOSPITAL_COMMUNITY): Payer: Medicare Other

## 2015-10-19 LAB — CBC
HCT: 28.5 % — ABNORMAL LOW (ref 39.0–52.0)
Hemoglobin: 8.5 g/dL — ABNORMAL LOW (ref 13.0–17.0)
MCH: 27.7 pg (ref 26.0–34.0)
MCHC: 29.8 g/dL — ABNORMAL LOW (ref 30.0–36.0)
MCV: 92.8 fL (ref 78.0–100.0)
Platelets: 157 10*3/uL (ref 150–400)
RBC: 3.07 MIL/uL — ABNORMAL LOW (ref 4.22–5.81)
RDW: 14.8 % (ref 11.5–15.5)
WBC: 3 10*3/uL — ABNORMAL LOW (ref 4.0–10.5)

## 2015-10-19 LAB — RENAL FUNCTION PANEL
Albumin: 2.2 g/dL — ABNORMAL LOW (ref 3.5–5.0)
Anion gap: 13 (ref 5–15)
BUN: 70 mg/dL — ABNORMAL HIGH (ref 6–20)
CO2: 25 mmol/L (ref 22–32)
Calcium: 7.5 mg/dL — ABNORMAL LOW (ref 8.9–10.3)
Chloride: 99 mmol/L — ABNORMAL LOW (ref 101–111)
Creatinine, Ser: 8 mg/dL — ABNORMAL HIGH (ref 0.61–1.24)
GFR calc Af Amer: 8 mL/min — ABNORMAL LOW (ref >60)
GFR calc non Af Amer: 7 mL/min — ABNORMAL LOW (ref >60)
Glucose, Bld: 306 mg/dL — ABNORMAL HIGH (ref 65–99)
Phosphorus: 8.2 mg/dL — ABNORMAL HIGH (ref 2.5–4.6)
Potassium: 5.1 mmol/L (ref 3.5–5.1)
Sodium: 137 mmol/L (ref 135–145)

## 2015-10-19 LAB — GLUCOSE, CAPILLARY
Glucose-Capillary: 264 mg/dL — ABNORMAL HIGH (ref 65–99)
Glucose-Capillary: 310 mg/dL — ABNORMAL HIGH (ref 65–99)
Glucose-Capillary: 167 mg/dL — ABNORMAL HIGH (ref 65–99)

## 2015-10-19 LAB — PROTIME-INR
INR: 1.69 — ABNORMAL HIGH (ref 0.00–1.49)
Prothrombin Time: 19.9 seconds — ABNORMAL HIGH (ref 11.6–15.2)

## 2015-10-19 LAB — VANCOMYCIN, RANDOM: Vancomycin Rm: 7 ug/mL

## 2015-10-19 MED ORDER — DOXERCALCIFEROL 4 MCG/2ML IV SOLN
INTRAVENOUS | Status: AC
  Administered 2015-10-19: 2 ug via INTRAVENOUS
  Filled 2015-10-19: qty 2

## 2015-10-19 MED ORDER — ANTICOAGULANT SODIUM CITRATE 4% (200MG/5ML) IV SOLN
5.0000 mL | Freq: Once | Status: DC
  Filled 2015-10-19 (×2): qty 250

## 2015-10-19 MED ORDER — DOXERCALCIFEROL 4 MCG/2ML IV SOLN
2.0000 ug | INTRAVENOUS | Status: DC
  Administered 2015-10-19 – 2015-10-26 (×4): 2 ug via INTRAVENOUS
  Filled 2015-10-19 (×4): qty 2

## 2015-10-19 MED ORDER — WARFARIN SODIUM 5 MG PO TABS
5.0000 mg | ORAL_TABLET | Freq: Once | ORAL | Status: AC
  Administered 2015-10-19: 5 mg via ORAL
  Filled 2015-10-19: qty 1

## 2015-10-19 MED ORDER — MIDAZOLAM HCL 2 MG/2ML IJ SOLN
INTRAMUSCULAR | Status: AC
  Filled 2015-10-19: qty 2

## 2015-10-19 MED ORDER — WARFARIN - PHARMACIST DOSING INPATIENT
Freq: Every day | Status: DC
  Administered 2015-10-21 – 2015-10-22 (×2)

## 2015-10-19 MED ORDER — HYDROCODONE-ACETAMINOPHEN 5-325 MG PO TABS
1.0000 | ORAL_TABLET | Freq: Three times a day (TID) | ORAL | Status: DC | PRN
  Administered 2015-10-20 – 2015-10-25 (×7): 1 via ORAL
  Filled 2015-10-19 (×8): qty 1

## 2015-10-19 MED ORDER — SODIUM CHLORIDE 0.9 % IV SOLN
125.0000 mg | INTRAVENOUS | Status: DC
  Administered 2015-10-19 – 2015-10-26 (×4): 125 mg via INTRAVENOUS
  Filled 2015-10-19 (×9): qty 10

## 2015-10-19 MED ORDER — FENTANYL CITRATE (PF) 100 MCG/2ML IJ SOLN
INTRAMUSCULAR | Status: AC | PRN
  Administered 2015-10-19: 50 ug via INTRAVENOUS
  Administered 2015-10-19: 25 ug via INTRAVENOUS

## 2015-10-19 MED ORDER — DARBEPOETIN ALFA 100 MCG/0.5ML IJ SOSY
PREFILLED_SYRINGE | INTRAMUSCULAR | Status: AC
  Administered 2015-10-19: 100 ug via INTRAVENOUS
  Filled 2015-10-19: qty 0.5

## 2015-10-19 MED ORDER — MORPHINE SULFATE (PF) 2 MG/ML IV SOLN
INTRAVENOUS | Status: AC
  Administered 2015-10-19: 2 mg via INTRAVENOUS
  Filled 2015-10-19: qty 1

## 2015-10-19 MED ORDER — ARGATROBAN 50 MG/50ML IV SOLN
0.2200 ug/kg/min | INTRAVENOUS | Status: DC
  Administered 2015-10-19 – 2015-10-24 (×7): 0.22 ug/kg/min via INTRAVENOUS
  Filled 2015-10-19 (×6): qty 50

## 2015-10-19 MED ORDER — CEFAZOLIN SODIUM-DEXTROSE 2-3 GM-% IV SOLR
INTRAVENOUS | Status: AC
  Administered 2015-10-19: 13:00:00
  Filled 2015-10-19: qty 50

## 2015-10-19 MED ORDER — DARBEPOETIN ALFA 100 MCG/0.5ML IJ SOSY
100.0000 ug | PREFILLED_SYRINGE | INTRAMUSCULAR | Status: DC
  Administered 2015-10-19 – 2015-10-26 (×2): 100 ug via INTRAVENOUS
  Filled 2015-10-19 (×2): qty 0.5

## 2015-10-19 MED ORDER — FENTANYL CITRATE (PF) 100 MCG/2ML IJ SOLN
INTRAMUSCULAR | Status: AC
  Filled 2015-10-19: qty 2

## 2015-10-19 MED ORDER — GELATIN ABSORBABLE 12-7 MM EX MISC
CUTANEOUS | Status: AC
  Filled 2015-10-19: qty 1

## 2015-10-19 MED ORDER — VANCOMYCIN HCL 1000 MG IV SOLR
1250.0000 mg | INTRAVENOUS | Status: AC
  Administered 2015-10-19: 1250 mg via INTRAVENOUS
  Filled 2015-10-19 (×4): qty 1250

## 2015-10-19 MED ORDER — INSULIN GLARGINE 100 UNIT/ML ~~LOC~~ SOLN
20.0000 [IU] | Freq: Every day | SUBCUTANEOUS | Status: DC
  Administered 2015-10-19: 20 [IU] via SUBCUTANEOUS
  Filled 2015-10-19 (×2): qty 0.2

## 2015-10-19 MED ORDER — LIDOCAINE HCL 1 % IJ SOLN
INTRAMUSCULAR | Status: AC
  Filled 2015-10-19: qty 20

## 2015-10-19 MED ORDER — CALCIUM ACETATE (PHOS BINDER) 667 MG PO CAPS
2001.0000 mg | ORAL_CAPSULE | Freq: Three times a day (TID) | ORAL | Status: DC
  Administered 2015-10-20 – 2015-10-26 (×18): 2001 mg via ORAL
  Filled 2015-10-19 (×18): qty 3

## 2015-10-19 MED ORDER — MIDAZOLAM HCL 2 MG/2ML IJ SOLN
INTRAMUSCULAR | Status: AC | PRN
  Administered 2015-10-19: 1 mg via INTRAVENOUS
  Administered 2015-10-19: 0.5 mg via INTRAVENOUS

## 2015-10-19 NOTE — Progress Notes (Signed)
[facility] PROGRESS NOTE  Domonic Kimball PYP:[id number] DOB: Feb 25, 1966 DOA: 10/07/2015 PCP: Hoyt Koch, MD Brief history: 50 y.o. male with multiple medical problems not limited to renal transplant, chronic kidney disease, diabetes and obesity, a new HD  dialysis fistula placed 09/28/15, presents with left lower extremity swelling and altered mental status.  Assessment/Plan: 1. Acute hypercarbic respiratory failure: - resolved. Wean him off oxygen as appropriate. Currently on 2 lit of [place] oxygen.   2. Hypertension: Better controlled. Resume home meds.   3. STAGE 4 ckd  NEW HD pt.  S/p renal transplant.  Hyperkalemia resolved.  Resume prograf  and prednisone. Cell cept held for leukopenia. HD catheter placement today by IR and plan for HD later tonight.  Post HD femoral HD catheter is removed 1/4 Clip in process.   4. Anemia of chronic disease: - stable.   5. Type 2 DM: CBG (last 3)   Recent Labs  10/18/15 2053 10/19/15 0738 10/19/15 1335  GLUCAP 332* 264* 310*    Resume SSI.  Increased to mod scale.  hgba1c 7.7 Added 4 units of novolog tidac to the mod scale. increased lantus to 20 units daily. Probably from the prednisone.   6. Urinary retention: probably from urethral stricture, would keep the foley in place and outpatient urology work up.  Foley insertion.   7. Fever: blood cultures drawn on 1/5 after the HD and was empirically started on vancomycin and zosyn. CXR shows mild vascular congestion.   8. DVT of the LLE on argatroban, with INR of around 3, slightly elevated. Argatroban held for the procedures this am and restarted back after the HD catheter placement.   9. Acute encephalopathy: resolved with resolution of the respiratory issues.    10. Acute on chronic diastolic heart failure: Appears compensated .    11. COPD: No wheezing heard. Stable.       Code Status: full code.  Family Communication: none at bedside.  Disposition  Plan: SNF when CLIP in place.    Consultants:  PCCM  Renal.   Procedures:  HD 1/4  Antibiotics:  none  HPI/Subjective: Breathing improved. No new complaints.   Objective: Filed Vitals:   10/19/15 1900 10/19/15 1930  BP: 122/48 145/51  Pulse: 61 74  Temp:    Resp: 15 23    Intake/Output Summary (Last 24 hours) at 10/19/15 1949 Last data filed at 10/19/15 1413  Gross per 24 hour  Intake    100 ml  Output    100 ml  Net      0 ml   Filed Weights   10/16/15 1330 10/16/15 1740 10/19/15 1600  Weight: 131.3 kg (289 lb 7.4 oz) 129.5 kg (285 lb 7.9 oz) 131 kg (288 lb 12.8 oz)    Exam:   General:  Alert comfortable.   Cardiovascular: s1s2  Respiratory: diminished at bases.   Abdomen soft NT nd bs+  Musculoskeletal: pedal edema.   Data Reviewed: Basic Metabolic Panel:  Recent Labs Lab 10/15/15 0335 10/16/15 0517 10/17/15 0645 10/18/15 0853 10/19/15 0620  NA 141 140 136 135 137  K 4.8 4.9 4.5 4.9 5.1  CL 104 102 100* 101 99*  CO2 27 26 27 22 25   GLUCOSE 159* 233* 276* 375* 306*  BUN 51* 64* 46* 63* 70*  CREATININE 3.54* 4.74* 4.41* 6.34* 8.00*  CALCIUM 7.5* 7.8* 7.7* 7.5* 7.5*  PHOS 5.2* 4.9* 5.7* 6.6* 8.2*   Liver Function Tests:  Recent Labs Lab 10/15/15 0335 10/16/15 0517 10/17/15  2620 10/18/15 0853 10/19/15 0620  AST 15  --   --   --   --   ALT 11*  --   --   --   --   ALKPHOS 81  --   --   --   --   BILITOT 1.2  --   --   --   --   PROT 4.6*  --   --   --   --   ALBUMIN 2.5* 2.5* 2.1* 2.4* 2.2*   No results for input(s): LIPASE, AMYLASE in the last 168 hours.  Recent Labs Lab 10/13/15 0815  AMMONIA 37*   CBC:  Recent Labs Lab 10/14/15 0500  10/15/15 0438 10/16/15 0517 10/17/15 0645 10/18/15 1205 10/19/15 0620  WBC 6.9  < > 6.4 14.7* 11.3* 4.3 3.0*  NEUTROABS 5.8  --   --   --   --   --   --   HGB 9.2*  < > 8.6* 9.6* 8.9* 8.4* 8.5*  HCT 31.4*  < > 28.9* 32.0* 29.1* 28.1* 28.5*  MCV 91.5  < > 90.9 89.9 91.8 92.1  92.8  PLT 233  < > 197 216 179 181 157  < > = values in this interval not displayed. Cardiac Enzymes: No results for input(s): CKTOTAL, CKMB, CKMBINDEX, TROPONINI in the last 168 hours. BNP (last 3 results)  Recent Labs  10/07/15 1145  BNP 1430.7*    ProBNP (last 3 results) No results for input(s): PROBNP in the last 8760 hours.  CBG:  Recent Labs Lab 10/18/15 1209 10/18/15 1700 10/18/15 2053 10/19/15 0738 10/19/15 1335  GLUCAP 311* 263* 332* 264* 310*    Recent Results (from the past 240 hour(s))  MRSA PCR Screening     Status: Abnormal   Collection Time: 10/13/15 11:28 AM  Result Value Ref Range Status   MRSA by PCR POSITIVE (A) NEGATIVE Final    Comment:        The GeneXpert MRSA Assay (FDA approved for NASAL specimens only), is one component of a comprehensive MRSA colonization surveillance program. It is not intended to diagnose MRSA infection nor to guide or monitor treatment for MRSA infections. RESULT CALLED TO, READ BACK BY AND VERIFIED WITH: C. SCHILLER RN 10/13/15 AT 1300 BY A. DAVIS   Culture, blood (Routine X 2) w Reflex to ID Panel     Status: None (Preliminary result)   Collection Time: 10/16/15  2:15 PM  Result Value Ref Range Status   Specimen Description BLOOD HEMODIALYSIS CATHETER  Final   Special Requests BOTTLES DRAWN AEROBIC AND ANAEROBIC 10CC  Final   Culture NO GROWTH 3 DAYS  Final   Report Status PENDING  Incomplete  Culture, blood (Routine X 2) w Reflex to ID Panel     Status: None (Preliminary result)   Collection Time: 10/16/15  2:30 PM  Result Value Ref Range Status   Specimen Description BLOOD HEMODIALYSIS CATHETER  Final   Special Requests BOTTLES DRAWN AEROBIC AND ANAEROBIC 10CC  Final   Culture NO GROWTH 3 DAYS  Final   Report Status PENDING  Incomplete     Studies: Ir Fluoro Guide Cv Line Right  10/19/2015  INDICATION: 50 year old male with chronic kidney disease requiring hemodialysis. He recently had line sepsis and has  been on a several day line holiday. He has a newly placed right upper extremity arteriovenous fistula which is still in the process of maturing. He needs durable dialysis access until his AVF is fully matured  and accessible. EXAM: TUNNELED CENTRAL VENOUS HEMODIALYSIS CATHETER PLACEMENT WITH ULTRASOUND AND FLUOROSCOPIC GUIDANCE MEDICATIONS: Ancef 2 gm IV; The IV antibiotic was given in an appropriate time interval prior to skin puncture. CONTRAST:  None ANESTHESIA/SEDATION: Versed 1.5 mg IV; Fentanyl 75 mcg IV Total Moderate Sedation Time 16 minutes. FLUOROSCOPY TIME:  36 seconds for a total of 8 mGy COMPLICATIONS: None immediate PROCEDURE: Informed written consent was obtained from the patient after a discussion of the risks, benefits, and alternatives to treatment. Questions regarding the procedure were encouraged and answered. The right neck and chest were prepped with chlorhexidine in a sterile fashion, and a sterile drape was applied covering the operative field. Maximum barrier sterile technique with sterile gowns and gloves were used for the procedure. A timeout was performed prior to the initiation of the procedure. After creating a small venotomy incision, a micropuncture kit was utilized to access the right internal jugular vein under direct, real-time ultrasound guidance after the overlying soft tissues were anesthetized with 1% lidocaine with epinephrine. Ultrasound image documentation was performed. The microwire was kinked to measure appropriate catheter length. A stiff glidewire was advanced to the level of the IVC and the micropuncture sheath was exchanged for a peel-away sheath. A Palindrome tunneled hemodialysis catheter measuring 23 cm from tip to cuff was tunneled in a retrograde fashion from the anterior chest wall to the venotomy incision. The catheter was then placed through the peel-away sheath with tips ultimately positioned within the superior aspect of the right atrium. Final catheter  positioning was confirmed and documented with a spot radiographic image. The catheter aspirates and flushes normally. The catheter was flushed with appropriate volume heparin dwells. The catheter exit site was secured with a 0-Prolene retention suture. The venotomy incision was closed with Dermabond. Dressings were applied. The patient tolerated the procedure well without immediate post procedural complication. IMPRESSION: Successful placement of 23 cm tip to cuff tunneled hemodialysis catheter via the right internal jugular vein with tips terminating within the superior aspect of the right atrium. The catheter is ready for immediate use. Signed, Criselda Peaches, MD [facility] Radiology Electronically Signed   By: Jacqulynn Cadet M.D.   On: 10/19/2015 13:01   Ir US Guide Vasc Access Right  10/19/2015  INDICATION: 50 year old male with chronic kidney disease requiring hemodialysis. He recently had line sepsis and has been on a several day line holiday. He has a newly placed right upper extremity arteriovenous fistula which is still in the process of maturing. He needs durable dialysis access until his AVF is fully matured and accessible. EXAM: TUNNELED CENTRAL VENOUS HEMODIALYSIS CATHETER PLACEMENT WITH ULTRASOUND AND FLUOROSCOPIC GUIDANCE MEDICATIONS: Ancef 2 gm IV; The IV antibiotic was given in an appropriate time interval prior to skin puncture. CONTRAST:  None ANESTHESIA/SEDATION: Versed 1.5 mg IV; Fentanyl 75 mcg IV Total Moderate Sedation Time 16 minutes. FLUOROSCOPY TIME:  36 seconds for a total of 8 mGy COMPLICATIONS: None immediate PROCEDURE: Informed written consent was obtained from the patient after a discussion of the risks, benefits, and alternatives to treatment. Questions regarding the procedure were encouraged and answered. The right neck and chest were prepped with chlorhexidine in a sterile fashion, and a sterile drape was applied covering  the operative field. Maximum barrier sterile technique with sterile gowns and gloves were used for the procedure. A timeout was performed prior to the initiation of the procedure. After creating a small venotomy incision, a micropuncture kit was utilized to access the  right internal jugular vein under direct, real-time ultrasound guidance after the overlying soft tissues were anesthetized with 1% lidocaine with epinephrine. Ultrasound image documentation was performed. The microwire was kinked to measure appropriate catheter length. A stiff glidewire was advanced to the level of the IVC and the micropuncture sheath was exchanged for a peel-away sheath. A Palindrome tunneled hemodialysis catheter measuring 23 cm from tip to cuff was tunneled in a retrograde fashion from the anterior chest wall to the venotomy incision. The catheter was then placed through the peel-away sheath with tips ultimately positioned within the superior aspect of the right atrium. Final catheter positioning was confirmed and documented with a spot radiographic image. The catheter aspirates and flushes normally. The catheter was flushed with appropriate volume heparin dwells. The catheter exit site was secured with a 0-Prolene retention suture. The venotomy incision was closed with Dermabond. Dressings were applied. The patient tolerated the procedure well without immediate post procedural complication. IMPRESSION: Successful placement of 23 cm tip to cuff tunneled hemodialysis catheter via the right internal jugular vein with tips terminating within the superior aspect of the right atrium. The catheter is ready for immediate use. Signed, Criselda Peaches, MD [facility] Radiology Electronically Signed   By: Jacqulynn Cadet M.D.   On: 10/19/2015 13:01    Scheduled Meds: . anticoagulant sodium citrate  5 mL Intravenous Once  . anticoagulant sodium citrate  5 mL Intravenous Once  .  aspirin EC  81 mg Oral Daily  . calcium acetate  2,001 mg Oral TID WC  . cloNIDine  0.3 mg Transdermal Weekly  . darbepoetin (ARANESP) injection - DIALYSIS  100 mcg Intravenous Q Sat-HD  . doxercalciferol  2 mcg Intravenous Q T,Th,Sa-HD  . febuxostat  80 mg Oral Daily  . fentaNYL      . ferric gluconate (FERRLECIT/NULECIT) IV  125 mg Intravenous Q T,Th,Sa-HD  . gelatin adsorbable      . insulin aspart  0-15 Units Subcutaneous TID WC  . insulin aspart  0-5 Units Subcutaneous QHS  . insulin aspart  4 Units Subcutaneous TID WC  . insulin glargine  20 Units Subcutaneous QHS  . labetalol  100 mg Oral TID  . lidocaine      . midazolam      . pantoprazole  40 mg Oral Daily  . piperacillin-tazobactam (ZOSYN)  IV  2.25 g Intravenous 3 times per day  . polyethylene glycol  17 g Oral BID  . predniSONE  5 mg Oral Q breakfast  . pregabalin  100 mg Oral QHS  . rosuvastatin  20 mg Oral QHS  . sodium chloride  3 mL Intravenous Q12H  . tacrolimus  1 mg Oral BID  . vancomycin  1,250 mg Intravenous Q T,Th,Sa-HD  . warfarin  5 mg Oral ONCE-1800  . Warfarin - Pharmacist Dosing Inpatient   Does not apply q1800   Continuous Infusions: . argatroban 0.22 mcg/kg/min (10/19/15 1510)    Principal Problem:   Deep vein thrombosis (DVT) of left lower extremity (HCC) Active Problems:   Obstructive sleep apnea   KIDNEY TRANSPLANTATION, HX OF   Benign essential HTN   H/O kidney transplant   Diabetes mellitus, type 2 (HCC)   Diabetic peripheral neuropathy (HCC)   Chronic kidney disease (CKD), stage IV (severe) (HCC)   CHF (congestive heart failure) (HCC)   Hyperkalemia   Prolonged Q-T interval on ECG   Chronic deep vein thrombosis (DVT) of left lower extremity ([place])  Acute on chronic congestive heart failure ([place])   Acute pulmonary edema ([place])    Time spent: 25 min    [facility] Pager [phone]. If 7PM-7AM, please contact night-coverage at [email or web address], password  [facility] 10/19/2015, 7:49 PM  LOS: 12 days

## 2015-10-19 NOTE — Progress Notes (Addendum)
ANTICOAGULATION CONSULT NOTE - Follow Up Pharmacy Consult for Argatroban (Warfarin on hold) Indication: DVT  Patient Measurements: Height: 6' (182.9 cm) Weight: 285 lb 7.9 oz (129.5 kg) IBW/kg (Calculated) : 77.6  Vital Signs: Temp: 97.8 F (36.6 C) (01/07 0534) Temp Source: Oral (01/07 0534) BP: 156/53 mmHg (01/07 0534) Pulse Rate: 79 (01/07 0534)  Labs:  Recent Labs  10/17/15 0645  10/18/15 0853 10/18/15 1205 10/18/15 1650 10/18/15 2119 10/19/15 0620 10/19/15 0818  HGB 8.9*  --   --  8.4*  --   --  8.5*  --   HCT 29.1*  --   --  28.1*  --   --  28.5*  --   PLT 179  --   --  181  --   --  157  --   APTT 48*  < >  --  98* 93* 78*  --   --   LABPROT 21.9*  --   --  34.4*  --   --   --  19.9*  INR 1.92*  --   --  3.50*  --   --   --  1.69*  CREATININE 4.41*  --  6.34*  --   --   --  8.00*  --   < > = values in this interval not displayed.  Assessment: 50 yo M admitted 12/26 and found to have DVT left popliteal vein. VQ negative for PE.  Argatroban was started on 12/26  due to porcine allergy and patient was initiated on Coumadin.  However, patient has AoCKD requiring HD 1/1, 1/2, and 1/4. Temp cath pulled 1/4 after HD d/t fevers and infection concern- Coumadin has been stopped to allow INR to trend down in anticipation of HD access placement.  Argatroban restarted 1/6.   Plan for IR to place tunneled cath today.  INR 1.69. Last aPTT 78seconds with Argatroban at 1.98mL/hr (0.77mcg/kg/min)- this has been on hold since 0200 this morning.  Goal of Therapy:  INR 2-3 (without influence of Argatroban) aPTT 50-90 seconds Monitor platelets by anticoagulation protocol: Yes    Plan:  -Follow IR/renal plans for cath placement and HD -warfarin and Argatroban remain on hold at this time- follow up resumption -When restarted, aPTT q2h until therapeutic x 2, then daily. -Daily CBC and INR   Calib Wadhwa D. Benn Tarver, PharmD, BCPS Clinical Pharmacist Pager: [phone] 10/19/2015 10:34  AM   ADDENDUM Successful placement of tunneled cath in IR today. Spoke with Dr. Karleen Hampshire and ok to resume Argatroban and warfarin.  Plan: -resume Argatroban at 1.22mL/hr (0.32mcg/kg/min) -aPTT 2h after restarting- continue q2h until therapeutic x2, then daily aPTT -warfarin 5mg  po x1 tonight -daily CBC and INR  Larri Yehle D. Bascom Biel, PharmD, BCPS Clinical Pharmacist Pager: [phone] 10/19/2015 2:46 PM

## 2015-10-19 NOTE — Sedation Documentation (Signed)
Patient denies pain and is resting comfortably.  

## 2015-10-19 NOTE — Progress Notes (Addendum)
Pharmacy Antibiotic Follow-up Note  [name] [name] is a 50 y.o. year-old male admitted on 10/07/2015.  The patient is currently on day 4 of vancomycin and Zosyn for bacteremia.  Assessment/Plan: The dose of vancomycin will be adjusted to 1250mg  IV x1 after HD today based on renal function/renal plans for HD today   -Received vancomycin 2500mg  IV x1 after HD on 1/4 -Zosyn 2.25g IV q8h  Temp (24hrs), Avg:98.1 F (36.7 C), Min:97.8 F (36.6 C), Max:98.3 F (36.8 C)   Recent Labs Lab 10/15/15 0438 10/16/15 0517 10/17/15 0645 10/18/15 1205 10/19/15 0620  WBC 6.4 14.7* 11.3* 4.3 3.0*    Recent Labs Lab 10/15/15 0335 10/16/15 0517 10/17/15 0645 10/18/15 0853 10/19/15 0620  CREATININE 3.54* 4.74* 4.41* 6.34* 8.00*   Estimated Creatinine Clearance: 15.2 mL/min (by C-G formula based on Cr of 8).    Allergies  Allergen Reactions  . Pork-Derived Products Anaphylaxis and Other (See Comments)    Fever also   . Ace Inhibitors Cough    Antimicrobials this admission: Vanc 1/4 >> Zosyn 1/4 >>  Levels/dose changes this admission: 1/7 VR = 7--will give 1250mg  IV x1 after HD on 1/7  Microbiology results: 1/4 BCx: NGTD UCx ordered: no result/not yet collected 1/1 MRSA PCR: positive  Thank you for allowing pharmacy to be a part of this patient's care.  Verna Czech PharmD 10/19/2015 10:21 AM

## 2015-10-19 NOTE — Procedures (Signed)
Interventional Radiology Procedure Note  Procedure: Placement of a Right IJ 23 cm tip-to-cuff Palindrome tunneled HD catheter.  Tip in the mid RA and ready for use.   Complications: 0   Estimated Blood Loss: 0   Recommendations: - Routine line care  Signed,  Criselda Peaches, MD

## 2015-10-19 NOTE — Progress Notes (Signed)
[facility] Rounding Note  Subjective:   Hopeful for [facility] today by IR Argatroban turned off 0200 with INR 8AM down to 1.69   Physical Exam:  BP 156/53 mmHg  Pulse 79  Temp(Src) 97.8 F (36.6 C) (Oral)  Resp 18  Ht 6' (1.829 m)  Wt 129.5 kg (285 lb 7.9 oz)  BMI 38.71 kg/m2  SpO2 92%  Just depressed that back on HD CV: S1S2 No S3 Lungs anteriorly clear ABD: Non-tender R renal allograft. + BS. EXT:No RLE edema,. 1+ LLE RUE BVT AVF: +B/T (09/27/15)   Scheduled Meds: . anticoagulant sodium citrate  5 mL Intravenous Once  . aspirin EC  81 mg Oral Daily  . calcitRIOL  2 mcg Oral Daily  . calcium acetate  1,334 mg Oral TID WC  . cloNIDine  0.3 mg Transdermal Weekly  . febuxostat  80 mg Oral Daily  . insulin aspart  0-15 Units Subcutaneous TID WC  . insulin aspart  0-5 Units Subcutaneous QHS  . insulin aspart  4 Units Subcutaneous TID WC  . insulin glargine  10 Units Subcutaneous QHS  . labetalol  100 mg Oral TID  . mycophenolate  750 mg Oral BID  . pantoprazole  40 mg Oral Daily  . piperacillin-tazobactam (ZOSYN)  IV  2.25 g Intravenous 3 times per day  . polyethylene glycol  17 g Oral BID  . predniSONE  5 mg Oral Q breakfast  . pregabalin  100 mg Oral QHS  . rosuvastatin  20 mg Oral QHS  . sodium chloride  3 mL Intravenous Q12H  . tacrolimus  1 mg Oral BID   Infusions:   PRN Meds:.sodium chloride, sodium chloride, sodium chloride, acetaminophen **OR** acetaminophen, bisacodyl, hydrALAZINE, HYDROcodone-acetaminophen, labetalol, morphine injection, ondansetron **OR** ondansetron (ZOFRAN) IV, promethazine, sodium chloride, technetium TC 40M diethylenetriame-pentaacetic acid    Recent Labs Lab 10/17/15 0645 10/18/15 0853 10/19/15 0620  NA 136 135 137  K 4.5 4.9 5.1  CL 100* 101 99*  CO2 27 22 25   GLUCOSE 276* 375* 306*  BUN 46* 63* 70*  CREATININE 4.41* 6.34* 8.00*  CALCIUM 7.7* 7.5* 7.5*  PHOS 5.7* 6.6* 8.2*    Recent Labs Lab 10/14/15 0500   10/17/15 0645 10/18/15 1205 10/19/15 0620  WBC 6.9  < > 11.3* 4.3 3.0*  NEUTROABS 5.8  --   --   --   --   HGB 9.2*  < > 8.9* 8.4* 8.5*  HCT 31.4*  < > 29.1* 28.1* 28.5*  MCV 91.5  < > 91.8 92.1 92.8  PLT 233  < > 179 181 157  < > = values in this interval not displayed.  Lab Results  Component Value Date   INR 1.69* 10/19/2015   INR 3.50* 10/18/2015   INR 1.92* 10/17/2015   1/4 BC's negative to date 1/2 UC neg  Results for JAHAIR, ESCANO (MRN [id number]) as of 10/17/2015 12:35  Ref. Range 10/16/2015 05:17  Iron Latest Ref Range: 45-182 ug/dL 6 (L)  UIBC Latest Units: ug/dL 218  TIBC Latest Ref Range: 250-450 ug/dL 224 (L)  Saturation Ratios Latest Ref Range: 17.9-39.5 % 3 (L)   10/15/15 CXR IMPRESSION: Improving congestive heart failure. Mild persistent pulmonary edema. Small left pleural effusion cannot be excluded 1/4 CXR IMPRESSION: Mild stable cardiomegaly with evidence of mild vascular congestion.  Background: 45M with CKD4T (failing renal allograft d/t CAN/recent ACR with baseline creatinine PTA around 3.6, progressive), admit with acute LLE DVT . Renal function worsened with diuresis,  continued to worsen with hyperkalemia, resp fx despite holding diuretics for 2 days, requiring acute HD (1st 1/1) d/t vol overload, hyperkalemia, hypercarbic resp fx.   ASSESSMENT/RECOMMENDATIONS  1. Progressive CKD4T with AKI, ddKT 2009 [facility], now with CAN, recent ACR (tx with IVIG and pulse steroids); ? AKI 2/2 diuresis, worsened 10/13/15 with hyperkalemia,. Required HD 1/1, 1/2, 1/4  via temp fem cath. I am thinking that he will remain HD dependent-->new ESRD. Concerned about fever so pulled temp cath after HD 1/4. Pt afebrile now. Blood cultures neg to date. Argatroban held. INR down to 1.69. Hopeful that IR can place cath today then HD after. CLIP process started.  2. Chronic immunosuppression on Tac/MMF/Pred; Fluc/Ganciclovir (TMP/SMX has been stopped). TAC was empirically reduced from 2.5mg   BID to 1mg  BID on 1/1 (appropriately so as TAC level came back at 12). WBC low - will stop mycophenolate. Continue pred/prograf.  3. Hypertension - Improved with meds and volume removal. 4. Acute LLE DVT Argatroban. Was transitioning to warfarin; VQ negative. See above discussion - once TDC placed can resume coumadin 5. 2HPTH on VDRA. PTH 408. Calcitriol dose increased to 2 mcg/day- will change to IV hectorol with HD. Increase Ca acetate 6. Porcine Allergy 7. Anemia - TSat low.Start IV Fe. Darbe with next HD (today) 8. CHF - markedly improved with volume removal.  9. COPD 10. DM 11. Aortic Insufficiency (hx/o Aortic IE 11/2014 Enteroccoccus  Faecalis) 12. Gout on febuxostat 13. S/p R BVT 09/27/2015 +B/T 14. Hx/o urethral stricture - has historically been issue w/his voiding problem. May need to ask Urology to see (they have seen him in the past)   15. FEVER AND LEUKOCYTOSIS 10/15/14. HAD ENTEROCOCCAL ENDOCARDITIS AND OSTEOMYELITIS THIS PAST YEAR. HAS AORTIC VALVE  INSUFF. PANCULTURED, STARTED EMPIRIC VANCO AND ZOSYN PER PHARMACY, PULLED FEM LINE AFTER HD (NOTE HE DOES NOT HAVE A PCN ALLERGY) Afebrile past 24 hours, WBC falling. Cultures neg to date. 16. MRSA PCR +   Jamal Maes, MD [facility] [phone] Pager 10/19/2015, 10:26 AM

## 2015-10-19 NOTE — Procedures (Signed)
I have personally attended this patient's dialysis session.   Pt is s/p placement of R IJ TDC by IR Preparing to initiate HD Plan 2K bath, no heparin Block catheter with citrate when done (heparin allergy)  Jamal Maes, MD [place] Pager 10/19/2015, 3:58 PM

## 2015-10-20 LAB — GLUCOSE, CAPILLARY
Glucose-Capillary: 297 mg/dL — ABNORMAL HIGH (ref 65–99)
Glucose-Capillary: 268 mg/dL — ABNORMAL HIGH (ref 65–99)
Glucose-Capillary: 271 mg/dL — ABNORMAL HIGH (ref 65–99)
Glucose-Capillary: 173 mg/dL — ABNORMAL HIGH (ref 65–99)

## 2015-10-20 LAB — RENAL FUNCTION PANEL
Albumin: 2 g/dL — ABNORMAL LOW (ref 3.5–5.0)
Anion gap: 11 (ref 5–15)
BUN: 34 mg/dL — ABNORMAL HIGH (ref 6–20)
CO2: 30 mmol/L (ref 22–32)
Calcium: 7.2 mg/dL — ABNORMAL LOW (ref 8.9–10.3)
Chloride: 97 mmol/L — ABNORMAL LOW (ref 101–111)
Creatinine, Ser: 5.72 mg/dL — ABNORMAL HIGH (ref 0.61–1.24)
GFR calc Af Amer: 12 mL/min — ABNORMAL LOW (ref >60)
GFR calc non Af Amer: 10 mL/min — ABNORMAL LOW (ref >60)
Glucose, Bld: 278 mg/dL — ABNORMAL HIGH (ref 65–99)
Phosphorus: 5.7 mg/dL — ABNORMAL HIGH (ref 2.5–4.6)
Potassium: 4.2 mmol/L (ref 3.5–5.1)
Sodium: 138 mmol/L (ref 135–145)

## 2015-10-20 LAB — CBC
HCT: 28.6 % — ABNORMAL LOW (ref 39.0–52.0)
Hemoglobin: 8.7 g/dL — ABNORMAL LOW (ref 13.0–17.0)
MCH: 27.8 pg (ref 26.0–34.0)
MCHC: 30.4 g/dL (ref 30.0–36.0)
MCV: 91.4 fL (ref 78.0–100.0)
Platelets: 197 10*3/uL (ref 150–400)
RBC: 3.13 MIL/uL — ABNORMAL LOW (ref 4.22–5.81)
RDW: 14.7 % (ref 11.5–15.5)
WBC: 3.7 10*3/uL — ABNORMAL LOW (ref 4.0–10.5)

## 2015-10-20 LAB — APTT
aPTT: 57 seconds — ABNORMAL HIGH (ref 24–37)
aPTT: 59 seconds — ABNORMAL HIGH (ref 24–37)

## 2015-10-20 LAB — PROTIME-INR
INR: 1.89 — ABNORMAL HIGH (ref 0.00–1.49)
Prothrombin Time: 21.6 seconds — ABNORMAL HIGH (ref 11.6–15.2)

## 2015-10-20 MED ORDER — INSULIN ASPART 100 UNIT/ML ~~LOC~~ SOLN
0.0000 [IU] | Freq: Three times a day (TID) | SUBCUTANEOUS | Status: DC
  Administered 2015-10-20 – 2015-10-21 (×2): 7 [IU] via SUBCUTANEOUS
  Administered 2015-10-21: 4 [IU] via SUBCUTANEOUS
  Administered 2015-10-21: 11 [IU] via SUBCUTANEOUS
  Administered 2015-10-22 (×2): 7 [IU] via SUBCUTANEOUS
  Administered 2015-10-23: 11 [IU] via SUBCUTANEOUS
  Administered 2015-10-23: 7 [IU] via SUBCUTANEOUS
  Administered 2015-10-23: 3 [IU] via SUBCUTANEOUS
  Administered 2015-10-23 – 2015-10-24 (×2): 11 [IU] via SUBCUTANEOUS
  Administered 2015-10-24: 15 [IU] via SUBCUTANEOUS
  Administered 2015-10-25: 7 [IU] via SUBCUTANEOUS
  Administered 2015-10-25 (×2): 4 [IU] via SUBCUTANEOUS
  Administered 2015-10-26: 3 [IU] via SUBCUTANEOUS
  Administered 2015-10-26 (×2): 4 [IU] via SUBCUTANEOUS

## 2015-10-20 MED ORDER — WARFARIN SODIUM 5 MG PO TABS
5.0000 mg | ORAL_TABLET | Freq: Once | ORAL | Status: AC
  Administered 2015-10-20: 5 mg via ORAL
  Filled 2015-10-20: qty 1

## 2015-10-20 MED ORDER — INSULIN GLARGINE 100 UNIT/ML ~~LOC~~ SOLN
25.0000 [IU] | Freq: Every day | SUBCUTANEOUS | Status: DC
  Administered 2015-10-20 – 2015-10-23 (×4): 25 [IU] via SUBCUTANEOUS
  Filled 2015-10-20 (×5): qty 0.25

## 2015-10-20 NOTE — Progress Notes (Signed)
ANTICOAGULATION CONSULT NOTE - Follow Up  Pharmacy Consult for Argatroban and warfarin Indication: DVT  Patient Measurements: Height: 6' (182.9 cm) Weight: 284 lb 9.8 oz (129.1 kg) IBW/kg (Calculated) : 77.6  Vital Signs: Temp: 98.4 F (36.9 C) (01/08 0610) Temp Source: Oral (01/08 0610) BP: 126/34 mmHg (01/08 0610) Pulse Rate: 69 (01/08 0610)  Labs:  Recent Labs  10/18/15 0853  10/18/15 1205  10/18/15 2119 10/19/15 0620 10/19/15 0818 10/20/15 0018 10/20/15 0630  HGB  --   < > 8.4*  --   --  8.5*  --   --  8.7*  HCT  --   --  28.1*  --   --  28.5*  --   --  28.6*  PLT  --   --  181  --   --  157  --   --  197  APTT  --   --  98*  < > 78*  --   --  57* 59*  LABPROT  --   --  34.4*  --   --   --  19.9*  --  21.6*  INR  --   --  3.50*  --   --   --  1.69*  --  1.89*  CREATININE 6.34*  --   --   --   --  8.00*  --   --  5.72*  < > = values in this interval not displayed.  Assessment: 50 yo M admitted 12/26 and found to have DVT left popliteal vein. VQ negative for PE.  Argatroban was started on 12/26  due to porcine allergy and patient was initiated on Coumadin.  However, Argatroban was held d/t concern for head bleed and warfarin was held for placement of tunneled HD catheter. Now both are resumed. APTT has been therapeutic x2 on the same rate. INR 1.89 this morning.   Goal of Therapy:  INR 2-3 (without influence of Argatroban) aPTT 50-90 seconds Monitor platelets by anticoagulation protocol: Yes    Plan:  -continue Argatroban at 1.79mL/hr (0.102mcg/kg/min) -warfarin 5mg  po x1 tonight -daily aPTT, CBC and INR  Rashun Grattan D. Stevi Hollinshead, PharmD, BCPS Clinical Pharmacist Pager: [phone] 10/20/2015 10:51 AM

## 2015-10-20 NOTE — Progress Notes (Signed)
ANTICOAGULATION CONSULT NOTE - Follow Up Consult  Pharmacy Consult for Argatroban (warfarin on hold) Indication: DVT  Allergies  Allergen Reactions  . Pork-Derived Products Anaphylaxis and Other (See Comments)    Fever also   . Ace Inhibitors Cough   Patient Measurements: Height: 6' (182.9 cm) Weight: 284 lb 9.8 oz (129.1 kg) IBW/kg (Calculated) : 77.6  Vital Signs: Temp: 97.7 F (36.5 C) (01/07 2203) Temp Source: Oral (01/07 2203) BP: 152/64 mmHg (01/07 2203) Pulse Rate: 88 (01/07 2203)  Labs:  Recent Labs  10/17/15 0645  10/18/15 0853 10/18/15 1205 10/18/15 1650 10/18/15 2119 10/19/15 0620 10/19/15 0818 10/20/15 0018  HGB 8.9*  --   --  8.4*  --   --  8.5*  --   --   HCT 29.1*  --   --  28.1*  --   --  28.5*  --   --   PLT 179  --   --  181  --   --  157  --   --   APTT 48*  < >  --  98* 93* 78*  --   --  57*  LABPROT 21.9*  --   --  34.4*  --   --   --  19.9*  --   INR 1.92*  --   --  3.50*  --   --   --  1.69*  --   CREATININE 4.41*  --  6.34*  --   --   --  8.00*  --   --   < > = values in this interval not displayed.  Estimated Creatinine Clearance: 15.2 mL/min (by C-G formula based on Cr of 8).  Assessment: 50 yo M admitted 12/26 and found to have DVT left popliteal vein. VQ negative for PE. Argatroban was started on 12/26due to porcine allergy and patient was initiated on Coumadin. However, admission has been complicated by AoCKD requiring HD 1/1, 1/2, and 1/4. Coumadin stopped to allow INR to trend down for HD cath placement  1/7: argatroban held>>HD cath placed>>argatroban re-started>>first aPTT after re-start is therapeutic at 57  Goal of Therapy:  aPTT 50-90 seconds Monitor platelets by anticoagulation protocol: Yes   Plan:  -Continue argatroban at 0.22 mcg/kg/min -aPTT with AM labs  Narda Bonds 10/20/2015,2:12 AM

## 2015-10-20 NOTE — Progress Notes (Signed)
[facility] Rounding Note  Subjective:   Got [facility] 1/7 by IR and HD 1/7 with 2 liters off Has remained afebrile since temp fem cath removed Blood cultures negative today (empirically covered with vanco and zosyn)   Physical Exam:  BP 126/34 mmHg  Pulse 69  Temp(Src) 98.4 F (36.9 C) (Oral)  Resp 18  Ht 6' (1.829 m)  Wt 129.1 kg (284 lb 9.8 oz)  BMI 38.59 kg/m2  SpO2 97%  Pleasant, depressed about being back on HD CV: S1S2 No S3 No rub Lungs anteriorly clear ABD: Non-tender R renal allograft. + BS. EXT:LLE 1+ edema (side of DVT) none on the right Old aneurysmal AVF (nonfx) left. Arm with fair amount of bruising RUE BVT AVF +B/T (09/27/15) R IJ TDC (1/7) dry dressing in place   Scheduled Meds: . anticoagulant sodium citrate  5 mL Intravenous Once  . aspirin EC  81 mg Oral Daily  . calcium acetate  2,001 mg Oral TID WC  . cloNIDine  0.3 mg Transdermal Weekly  . darbepoetin (ARANESP) injection - DIALYSIS  100 mcg Intravenous Q Sat-HD  . doxercalciferol  2 mcg Intravenous Q T,Th,Sa-HD  . febuxostat  80 mg Oral Daily  . ferric gluconate (FERRLECIT/NULECIT) IV  125 mg Intravenous Q T,Th,Sa-HD  . insulin aspart  0-15 Units Subcutaneous TID WC  . insulin aspart  0-5 Units Subcutaneous QHS  . insulin aspart  4 Units Subcutaneous TID WC  . insulin glargine  20 Units Subcutaneous QHS  . labetalol  100 mg Oral TID  . pantoprazole  40 mg Oral Daily  . piperacillin-tazobactam (ZOSYN)  IV  2.25 g Intravenous 3 times per day  . polyethylene glycol  17 g Oral BID  . predniSONE  5 mg Oral Q breakfast  . pregabalin  100 mg Oral QHS  . rosuvastatin  20 mg Oral QHS  . sodium chloride  3 mL Intravenous Q12H  . tacrolimus  1 mg Oral BID  . Warfarin - Pharmacist Dosing Inpatient   Does not apply q1800   Infusions: . argatroban 0.22 mcg/kg/min (10/20/15 0321)   PRN Meds:.sodium chloride, sodium chloride, sodium chloride, acetaminophen **OR** acetaminophen, bisacodyl,  hydrALAZINE, HYDROcodone-acetaminophen, labetalol, morphine injection, ondansetron **OR** ondansetron (ZOFRAN) IV, promethazine, sodium chloride, technetium TC 103M diethylenetriame-pentaacetic acid    Recent Labs Lab 10/18/15 0853 10/19/15 0620 10/20/15 0630  NA 135 137 138  K 4.9 5.1 4.2  CL 101 99* 97*  CO2 22 25 30   GLUCOSE 375* 306* 278*  BUN 63* 70* 34*  CREATININE 6.34* 8.00* 5.72*  CALCIUM 7.5* 7.5* 7.2*  PHOS 6.6* 8.2* 5.7*    Recent Labs Lab 10/14/15 0500  10/18/15 1205 10/19/15 0620 10/20/15 0630  WBC 6.9  < > 4.3 3.0* 3.7*  NEUTROABS 5.8  --   --   --   --   HGB 9.2*  < > 8.4* 8.5* 8.7*  HCT 31.4*  < > 28.1* 28.5* 28.6*  MCV 91.5  < > 92.1 92.8 91.4  PLT 233  < > 181 157 197  < > = values in this interval not displayed.  Lab Results  Component Value Date   INR 1.89* 10/20/2015   INR 1.69* 10/19/2015   INR 3.50* 10/18/2015   1/4 BC's negative to date 1/2 UC neg  Results for ARTIST, ZLOTNICK (MRN [id number]) as of 10/17/2015 12:35  Ref. Range 10/16/2015 05:17  Iron Latest Ref Range: 45-182 ug/dL 6 (L)  UIBC Latest Units: ug/dL 218  TIBC  Latest Ref Range: 250-450 ug/dL 224 (L)  Saturation Ratios Latest Ref Range: 17.9-39.5 % 3 (L)   10/15/15 CXR IMPRESSION: Improving congestive heart failure. Mild persistent pulmonary edema. Small left pleural effusion cannot be excluded 1/4 CXR IMPRESSION: Mild stable cardiomegaly with evidence of mild vascular congestion.  Background: 47M with CKD4T (failing renal allograft d/t CAN/recent ACR with baseline creatinine PTA around 3.6, progressive), admit with acute LLE DVT . Renal function worsened with diuresis, continued to worsen with hyperkalemia, resp fx despite holding diuretics for 2 days, requiring acute HD (1st 1/1) d/t vol overload, hyperkalemia, hypercarbic resp fx.   ASSESSMENT/RECOMMENDATIONS  1. Progressive CKD4T with AKI  (ddKT 2009 [facility], CAN, recent ACR tx with IVIG and pulse steroids). Now with failed renal  transplant, back on HD-->new ESRD. Has maturing R AVF (09/27/15) and TDC by IR (1/7). CLIP process started. Anticipate (but not firm yet) that he will be TTS at [facility] I think EDW will be around 129 kg. 2. Chronic immunosuppression on Tac/MMF/Pred. Tac reduced to 1 mg BID and will taper off over months. Continue prednisone 5 mg/day. I stopped mycophenolate altogether d/t leukopenia.   3. Hypertension - Improved with meds and volume removal. 4. Acute LLE DVT - VQ negative. Warfarin was held so that [facility] could be placed - now can resume with argatroban bridge (heparin allergy). Pharmacy managing. 5. 2HPTH on VDRA. PTH 408. Changed calcitriol to IV hectorol with HD since this is VDRA he will get at outpt HD unit. Phosphorus improving with ca acetate. 6. Porcine Allergy 7. Anemia - TSat low.Started IV Fe load (rec'd 1st dose 1/7) . Darbe 100/wk started with HD 1/7.   8. CHF - markedly improved with volume removal.  9. COPD 10. DM 11. Aortic Insufficiency (hx/o Aortic IE 11/2014 Enteroccoccus  Faecalis) 12. Gout on febuxostat 13. S/p R BVT 09/27/2015 +B/T 14. Hx/o urethral stricture - has historically been issue w/his voiding problem. May need to ask Urology to see (they have seen him in the past)   15. FEVER AND LEUKOCYTOSIS 10/15/14. HAD ENTEROCOCCAL ENDOCARDITIS AND OSTEOMYELITIS THIS PAST YEAR. HAS AORTIC VALVE  INSUFF. PANCULTURED, STARTED EMPIRIC VANCO AND ZOSYN PER PHARMACY, PULLED FEM LINE AFTER HD (NOTE HE DOES NOT HAVE A PCN ALLERGY) Remains afebrile, cultures negative, may have been related to fem cath. Stop ATB's.  16. MRSA PCR +   Jamal Maes, MD [facility] [phone] Pager 10/20/2015, 9:45 AM

## 2015-10-20 NOTE — Progress Notes (Signed)
[facility] PROGRESS NOTE  [name] [name] [id number] DOB: Jul 31, 1966 DOA: 10/07/2015 PCP: Hoyt Koch, MD Brief history: 50 y.o. male with multiple medical problems not limited to renal transplant, chronic kidney disease, diabetes and obesity, a new HD  dialysis fistula placed 09/28/15, presents with left lower extremity swelling and altered mental status.  Assessment/Plan: 1. Acute hypercarbic respiratory failure: - resolved. Wean him off oxygen as appropriate. Currently on 2 lit of [place] oxygen.   2. Hypertension: Better controlled. Resume home meds.   3. STAGE 4 ckd  NEW HD pt.  S/p renal transplant.  Hyperkalemia resolved.  Resume prograf  and prednisone. Cell cept held for leukopenia. HD catheter placement on 1/7 by IR and plan for HD later tonight.  Post HD femoral HD catheter is removed 1/4 Clip in process.   4. Anemia of chronic disease: - stable.   5. Type 2 DM: CBG (last 3)   Recent Labs  10/20/15 0811 10/20/15 1152 10/20/15 1633  GLUCAP 297* 268* 271*    Resume SSI.  Increased to resistant scale.  hgba1c 7.7 Added 4 units of novolog tidac to the mod scale. increased lantus to 25 units daily.  hyperglycemia Probably from the prednisone.   6. Urinary retention: probably from urethral stricture, would keep the foley in place and outpatient urology work up.  Foley insertion.   7. Fever: blood cultures drawn on 1/5 after the HD and was empirically started on vancomycin and zosyn. CXR shows mild vascular congestion. He has been afebrile since the femoral HD catheter was resolved. Discontinued vancomycin and zosyn.   8. DVT of the LLE on argatroban, with INR of around 3, slightly elevated. Argatroban held for the procedure for IR HD, and restarted back after the HD catheter placement.   9. Acute encephalopathy: resolved with resolution of the respiratory issues.    10. Acute on chronic diastolic heart failure: Appears compensated .    11.  COPD: No wheezing heard. Stable.       Code Status: full code.  Family Communication: none at bedside.  Disposition Plan: SNF when CLIP in place.    Consultants:  PCCM  Renal.   Procedures:  HD 1/4, 1/7  Antibiotics:  Vancomycin dc/ed on 1/8  Zosyn discontinued with 1/8  HPI/Subjective: Breathing improved. No new complaints.  requesting letter for work.  Objective: Filed Vitals:   10/20/15 0610 10/20/15 1456  BP: 126/34 145/52  Pulse: 69 75  Temp: 98.4 F (36.9 C) 98.6 F (37 C)  Resp: 18 20    Intake/Output Summary (Last 24 hours) at 10/20/15 1659 Last data filed at 10/20/15 5427  Gross per 24 hour  Intake      0 ml  Output   2101 ml  Net  -2101 ml   Filed Weights   10/16/15 1740 10/19/15 1600 10/19/15 2015  Weight: 129.5 kg (285 lb 7.9 oz) 131 kg (288 lb 12.8 oz) 129.1 kg (284 lb 9.8 oz)    Exam:   General:  Alert comfortable.   Cardiovascular: s1s2  Respiratory: diminished at bases.   Abdomen soft NT nd bs+  Musculoskeletal: pedal edema.   Data Reviewed: Basic Metabolic Panel:  Recent Labs Lab 10/16/15 0517 10/17/15 0645 10/18/15 0853 10/19/15 0620 10/20/15 0630  NA 140 136 135 137 138  K 4.9 4.5 4.9 5.1 4.2  CL 102 100* 101 99* 97*  CO2 26 27 22 25 30   GLUCOSE 233* 276* 375* 306* 278*  BUN 64* 46* 63* 70* 34*  CREATININE 4.74* 4.41* 6.34* 8.00* 5.72*  CALCIUM 7.8* 7.7* 7.5* 7.5* 7.2*  PHOS 4.9* 5.7* 6.6* 8.2* 5.7*   Liver Function Tests:  Recent Labs Lab 10/15/15 0335 10/16/15 0517 10/17/15 0645 10/18/15 0853 10/19/15 0620 10/20/15 0630  AST 15  --   --   --   --   --   ALT 11*  --   --   --   --   --   ALKPHOS 81  --   --   --   --   --   BILITOT 1.2  --   --   --   --   --   PROT 4.6*  --   --   --   --   --   ALBUMIN 2.5* 2.5* 2.1* 2.4* 2.2* 2.0*   No results for input(s): LIPASE, AMYLASE in the last 168 hours. No results for input(s): AMMONIA in the last 168 hours. CBC:  Recent Labs Lab 10/14/15 0500   10/16/15 0517 10/17/15 0645 10/18/15 1205 10/19/15 0620 10/20/15 0630  WBC 6.9  < > 14.7* 11.3* 4.3 3.0* 3.7*  NEUTROABS 5.8  --   --   --   --   --   --   HGB 9.2*  < > 9.6* 8.9* 8.4* 8.5* 8.7*  HCT 31.4*  < > 32.0* 29.1* 28.1* 28.5* 28.6*  MCV 91.5  < > 89.9 91.8 92.1 92.8 91.4  PLT 233  < > 216 179 181 157 197  < > = values in this interval not displayed. Cardiac Enzymes: No results for input(s): CKTOTAL, CKMB, CKMBINDEX, TROPONINI in the last 168 hours. BNP (last 3 results)  Recent Labs  10/07/15 1145  BNP 1430.7*    ProBNP (last 3 results) No results for input(s): PROBNP in the last 8760 hours.  CBG:  Recent Labs Lab 10/19/15 1335 10/19/15 2201 10/20/15 0811 10/20/15 1152 10/20/15 1633  GLUCAP 310* 167* 297* 268* 271*    Recent Results (from the past 240 hour(s))  MRSA PCR Screening     Status: Abnormal   Collection Time: 10/13/15 11:28 AM  Result Value Ref Range Status   MRSA by PCR POSITIVE (A) NEGATIVE Final    Comment:        The GeneXpert MRSA Assay (FDA approved for NASAL specimens only), is one component of a comprehensive MRSA colonization surveillance program. It is not intended to diagnose MRSA infection nor to guide or monitor treatment for MRSA infections. RESULT CALLED TO, READ BACK BY AND VERIFIED WITH: C. SCHILLER RN 10/13/15 AT 1300 BY A. DAVIS   Culture, blood (Routine X 2) w Reflex to ID Panel     Status: None (Preliminary result)   Collection Time: 10/16/15  2:15 PM  Result Value Ref Range Status   Specimen Description BLOOD HEMODIALYSIS CATHETER  Final   Special Requests BOTTLES DRAWN AEROBIC AND ANAEROBIC 10CC  Final   Culture NO GROWTH 4 DAYS  Final   Report Status PENDING  Incomplete  Culture, blood (Routine X 2) w Reflex to ID Panel     Status: None (Preliminary result)   Collection Time: 10/16/15  2:30 PM  Result Value Ref Range Status   Specimen Description BLOOD HEMODIALYSIS CATHETER  Final   Special Requests BOTTLES  DRAWN AEROBIC AND ANAEROBIC 10CC  Final   Culture NO GROWTH 4 DAYS  Final   Report Status PENDING  Incomplete     Studies: Ir Fluoro Guide Cv Line Right  10/19/2015  INDICATION:  50 year old male with chronic kidney disease requiring hemodialysis. He recently had line sepsis and has been on a several day line holiday. He has a newly placed right upper extremity arteriovenous fistula which is still in the process of maturing. He needs durable dialysis access until his AVF is fully matured and accessible. EXAM: TUNNELED CENTRAL VENOUS HEMODIALYSIS CATHETER PLACEMENT WITH ULTRASOUND AND FLUOROSCOPIC GUIDANCE MEDICATIONS: Ancef 2 gm IV; The IV antibiotic was given in an appropriate time interval prior to skin puncture. CONTRAST:  None ANESTHESIA/SEDATION: Versed 1.5 mg IV; Fentanyl 75 mcg IV Total Moderate Sedation Time 16 minutes. FLUOROSCOPY TIME:  36 seconds for a total of 8 mGy COMPLICATIONS: None immediate PROCEDURE: Informed written consent was obtained from the patient after a discussion of the risks, benefits, and alternatives to treatment. Questions regarding the procedure were encouraged and answered. The right neck and chest were prepped with chlorhexidine in a sterile fashion, and a sterile drape was applied covering the operative field. Maximum barrier sterile technique with sterile gowns and gloves were used for the procedure. A timeout was performed prior to the initiation of the procedure. After creating a small venotomy incision, a micropuncture kit was utilized to access the right internal jugular vein under direct, real-time ultrasound guidance after the overlying soft tissues were anesthetized with 1% lidocaine with epinephrine. Ultrasound image documentation was performed. The microwire was kinked to measure appropriate catheter length. A stiff glidewire was advanced to the level of the IVC and the micropuncture sheath was exchanged for a peel-away sheath. A Palindrome tunneled hemodialysis  catheter measuring 23 cm from tip to cuff was tunneled in a retrograde fashion from the anterior chest wall to the venotomy incision. The catheter was then placed through the peel-away sheath with tips ultimately positioned within the superior aspect of the right atrium. Final catheter positioning was confirmed and documented with a spot radiographic image. The catheter aspirates and flushes normally. The catheter was flushed with appropriate volume heparin dwells. The catheter exit site was secured with a 0-Prolene retention suture. The venotomy incision was closed with Dermabond. Dressings were applied. The patient tolerated the procedure well without immediate post procedural complication. IMPRESSION: Successful placement of 23 cm tip to cuff tunneled hemodialysis catheter via the right internal jugular vein with tips terminating within the superior aspect of the right atrium. The catheter is ready for immediate use. Signed, Criselda Peaches, MD [facility] Electronically Signed   By: Jacqulynn Cadet M.D.   On: 10/19/2015 13:01   Ir US Guide Vasc Access Right  10/19/2015  INDICATION: 50 year old male with chronic kidney disease requiring hemodialysis. He recently had line sepsis and has been on a several day line holiday. He has a newly placed right upper extremity arteriovenous fistula which is still in the process of maturing. He needs durable dialysis access until his AVF is fully matured and accessible. EXAM: TUNNELED CENTRAL VENOUS HEMODIALYSIS CATHETER PLACEMENT WITH ULTRASOUND AND FLUOROSCOPIC GUIDANCE MEDICATIONS: Ancef 2 gm IV; The IV antibiotic was given in an appropriate time interval prior to skin puncture. CONTRAST:  None ANESTHESIA/SEDATION: Versed 1.5 mg IV; Fentanyl 75 mcg IV Total Moderate Sedation Time 16 minutes. FLUOROSCOPY TIME:  36 seconds for a total of 8 mGy COMPLICATIONS: None immediate PROCEDURE: Informed written consent  was obtained from the patient after a discussion of the risks, benefits, and alternatives to treatment. Questions regarding the procedure were encouraged and answered. The right neck and chest were prepped with chlorhexidine in a  sterile fashion, and a sterile drape was applied covering the operative field. Maximum barrier sterile technique with sterile gowns and gloves were used for the procedure. A timeout was performed prior to the initiation of the procedure. After creating a small venotomy incision, a micropuncture kit was utilized to access the right internal jugular vein under direct, real-time ultrasound guidance after the overlying soft tissues were anesthetized with 1% lidocaine with epinephrine. Ultrasound image documentation was performed. The microwire was kinked to measure appropriate catheter length. A stiff glidewire was advanced to the level of the IVC and the micropuncture sheath was exchanged for a peel-away sheath. A Palindrome tunneled hemodialysis catheter measuring 23 cm from tip to cuff was tunneled in a retrograde fashion from the anterior chest wall to the venotomy incision. The catheter was then placed through the peel-away sheath with tips ultimately positioned within the superior aspect of the right atrium. Final catheter positioning was confirmed and documented with a spot radiographic image. The catheter aspirates and flushes normally. The catheter was flushed with appropriate volume heparin dwells. The catheter exit site was secured with a 0-Prolene retention suture. The venotomy incision was closed with Dermabond. Dressings were applied. The patient tolerated the procedure well without immediate post procedural complication. IMPRESSION: Successful placement of 23 cm tip to cuff tunneled hemodialysis catheter via the right internal jugular vein with tips terminating within the superior aspect of the right atrium. The catheter is ready for immediate use. Signed, Criselda Peaches, MD  [facility] Radiology Electronically Signed   By: Jacqulynn Cadet M.D.   On: 10/19/2015 13:01    Scheduled Meds: . aspirin EC  81 mg Oral Daily  . calcium acetate  2,001 mg Oral TID WC  . cloNIDine  0.3 mg Transdermal Weekly  . darbepoetin (ARANESP) injection - DIALYSIS  100 mcg Intravenous Q Sat-HD  . doxercalciferol  2 mcg Intravenous Q T,Th,Sa-HD  . febuxostat  80 mg Oral Daily  . ferric gluconate (FERRLECIT/NULECIT) IV  125 mg Intravenous Q T,Th,Sa-HD  . insulin aspart  0-20 Units Subcutaneous TID WC  . insulin aspart  0-5 Units Subcutaneous QHS  . insulin aspart  4 Units Subcutaneous TID WC  . insulin glargine  25 Units Subcutaneous QHS  . labetalol  100 mg Oral TID  . pantoprazole  40 mg Oral Daily  . polyethylene glycol  17 g Oral BID  . predniSONE  5 mg Oral Q breakfast  . pregabalin  100 mg Oral QHS  . rosuvastatin  20 mg Oral QHS  . tacrolimus  1 mg Oral BID  . warfarin  5 mg Oral ONCE-1800  . Warfarin - Pharmacist Dosing Inpatient   Does not apply q1800   Continuous Infusions: . argatroban 0.22 mcg/kg/min (10/20/15 0321)    Principal Problem:   Deep vein thrombosis (DVT) of left lower extremity (HCC) Active Problems:   Obstructive sleep apnea   KIDNEY TRANSPLANTATION, HX OF   Benign essential HTN   H/O kidney transplant   Diabetes mellitus, type 2 (HCC)   Diabetic peripheral neuropathy (HCC)   Chronic kidney disease (CKD), stage IV (severe) (HCC)   CHF (congestive heart failure) (HCC)   Hyperkalemia   Prolonged Q-T interval on ECG   Chronic deep vein thrombosis (DVT) of left lower extremity ([place])   Acute on chronic congestive heart failure ([place])   Acute pulmonary edema ([place])    Time spent: 25 min    [facility] Pager [phone]. If  7PM-7AM, please contact night-coverage at [email or web address], password [facility] 10/20/2015, 4:59 PM  LOS: 13 days

## 2015-10-21 ENCOUNTER — Other Ambulatory Visit (HOSPITAL_COMMUNITY): Payer: Self-pay

## 2015-10-21 LAB — CULTURE, BLOOD (ROUTINE X 2)
Culture: NO GROWTH
Culture: NO GROWTH

## 2015-10-21 LAB — RENAL FUNCTION PANEL
Albumin: 2.4 g/dL — ABNORMAL LOW (ref 3.5–5.0)
Anion gap: 12 (ref 5–15)
BUN: 45 mg/dL — ABNORMAL HIGH (ref 6–20)
CO2: 28 mmol/L (ref 22–32)
Calcium: 7.7 mg/dL — ABNORMAL LOW (ref 8.9–10.3)
Chloride: 98 mmol/L — ABNORMAL LOW (ref 101–111)
Creatinine, Ser: 7.94 mg/dL — ABNORMAL HIGH (ref 0.61–1.24)
GFR calc Af Amer: 8 mL/min — ABNORMAL LOW (ref >60)
GFR calc non Af Amer: 7 mL/min — ABNORMAL LOW (ref >60)
Glucose, Bld: 218 mg/dL — ABNORMAL HIGH (ref 65–99)
Phosphorus: 7.9 mg/dL — ABNORMAL HIGH (ref 2.5–4.6)
Potassium: 4.5 mmol/L (ref 3.5–5.1)
Sodium: 138 mmol/L (ref 135–145)

## 2015-10-21 LAB — CBC
HCT: 32 % — ABNORMAL LOW (ref 39.0–52.0)
Hemoglobin: 9.3 g/dL — ABNORMAL LOW (ref 13.0–17.0)
MCH: 27.2 pg (ref 26.0–34.0)
MCHC: 29.1 g/dL — ABNORMAL LOW (ref 30.0–36.0)
MCV: 93.6 fL (ref 78.0–100.0)
Platelets: 208 10*3/uL (ref 150–400)
RBC: 3.42 MIL/uL — ABNORMAL LOW (ref 4.22–5.81)
RDW: 14.8 % (ref 11.5–15.5)
WBC: 4.2 10*3/uL (ref 4.0–10.5)

## 2015-10-21 LAB — PROTIME-INR
INR: 1.8 — ABNORMAL HIGH (ref 0.00–1.49)
Prothrombin Time: 20.8 seconds — ABNORMAL HIGH (ref 11.6–15.2)

## 2015-10-21 LAB — GLUCOSE, CAPILLARY
Glucose-Capillary: 274 mg/dL — ABNORMAL HIGH (ref 65–99)
Glucose-Capillary: 204 mg/dL — ABNORMAL HIGH (ref 65–99)
Glucose-Capillary: 167 mg/dL — ABNORMAL HIGH (ref 65–99)
Glucose-Capillary: 147 mg/dL — ABNORMAL HIGH (ref 65–99)

## 2015-10-21 LAB — APTT: aPTT: 58 seconds — ABNORMAL HIGH (ref 24–37)

## 2015-10-21 MED ORDER — SODIUM CHLORIDE 0.9 % IV SOLN: 100.0000 mL | INTRAVENOUS | Status: DC | PRN

## 2015-10-21 MED ORDER — LIDOCAINE HCL (PF) 1 % IJ SOLN: 5.0000 mL | INTRAMUSCULAR | Status: DC | PRN

## 2015-10-21 MED ORDER — LIDOCAINE-PRILOCAINE 2.5-2.5 % EX CREA: 1.0000 "application " | TOPICAL_CREAM | CUTANEOUS | Status: DC | PRN

## 2015-10-21 MED ORDER — ANTICOAGULANT SODIUM CITRATE 4% (200MG/5ML) IV SOLN: 5.0000 mL | Status: DC | PRN

## 2015-10-21 MED ORDER — ALTEPLASE 2 MG IJ SOLR: 2.0000 mg | Freq: Once | INTRAMUSCULAR | Status: DC | PRN

## 2015-10-21 MED ORDER — WARFARIN SODIUM 7.5 MG PO TABS
7.5000 mg | ORAL_TABLET | Freq: Once | ORAL | Status: AC
  Administered 2015-10-21: 7.5 mg via ORAL
  Filled 2015-10-21: qty 1

## 2015-10-21 MED ORDER — CYCLOBENZAPRINE HCL 5 MG PO TABS
7.5000 mg | ORAL_TABLET | Freq: Once | ORAL | Status: AC
  Administered 2015-10-21: 7.5 mg via ORAL
  Filled 2015-10-21: qty 2

## 2015-10-21 MED ORDER — PENTAFLUOROPROP-TETRAFLUOROETH EX AERO: 1.0000 "application " | INHALATION_SPRAY | CUTANEOUS | Status: DC | PRN

## 2015-10-21 NOTE — Progress Notes (Signed)
RN removed foley per MD order. Pt tolerated removal well. Pt due to void by 2045. RN informed CNA tech to notify RN when pt voids. Will pass this information to night shift. Dorita Fray 10/21/2015 2:49 PM

## 2015-10-21 NOTE — Patient Instructions (Signed)
Narrowing Stance: Eyes Open and Eyes Shut    Stand facing forward, eyes open.  Stand 5 seconds then bring feet slightly closer together. Repeat until feet are touching.  Repeat 5 times.  To challenge:  Try with eyes closed.    Alternating Arm Swings    Standing in neutral posture, on floor, swing one arm forward, the other back. Try to keep body still.  Do 10  Repetitions. To challenge:  Try using a weight or try standing on a pillow.       Leg Curl - With Support      Holding onto back of chair, or something stable.  Kick one leg back.  Then repeat with other leg, alternating legs.  Do 10 repetitions.   To challenge:  Try to raise hands off of chair.     Toe / Heel Raise    Holding onto back of chair or something stable, gently rock back on heels and raise toes. Then rock forward on toes and raise heels. Repeat sequence 10 times.   To challenge:  Try to raise hands off of chair.      FUNCTIONAL MOBILITY: Marching - Standing    Holding onto back of chair or something stable,march in place by lifting left leg up, then right. Alternate. Repeat 10 times. To challenge:  Try to raise hands off of chair/surface.     [place]    Using support, cross one foot over other. Then bring back foot up beside front foot. Repeat, going the other direction. Repeat 10 times.      Tandem Stance    Holding onto a chair or stable surface with one hand, place right foot in front of left, heel touching toe and both feet "straight ahead". Balance in this position 10 seconds.  Repeat with left foot in front of right. To challenge:  Try to left hands off chair.    Hip Abduction: Standing Side Leg Lift (Eccentric)    Holding onto chair or stable surface, lift leg out to side quickly. Slowly lower for 3-5 seconds. Repeat 10 times each side. To challenge:  Try to lift hands off chair.     SINGLE LIMB STANCE     Holding onto chair or stable surface with one hand,  try lifting one leg and balancing on the other.  Hold for 5 seconds.  Repeat with other leg. To challenge:  Try to lift hand off chair.   Soft Mat: Rocking Anterior / Posterior    **Make sure someone is standing with you to guard you! Balancing on a soft mat or pad such as folded towels, feet hip width apart, rock body forward and backward without moving feet. Hold each position 2 seconds.  Do 10 repetitions.         Turning in Place: Solid Surface     **Make sure someone is standing with you to guard you! Standing in place, lead with head and turn slowly making quarter turns toward left. Repeat 5 times.

## 2015-10-21 NOTE — Progress Notes (Signed)
Physical Therapy Treatment Patient Details Name: [name] [name] MRN: [id number] DOB: 05-Aug-1966 Today's Date: 10/21/2015    History of Present Illness Pt adm with LLE DVT and CKD. Pt required bipap and HD was initiated. Pt with hx of renal transplant 7 years ago.  PMH - DM with neuropathy, HTN, CAD, COPD, endocarditis, back pain, osteomyelitis    PT Comments    Pt with improved mobility. Pt refused SNF and plans to return home with intermittent assistance of aides.  Follow Up Recommendations  Home health PT (Pt with improved mobility and refused SNF.)     Equipment Recommendations  None recommended by PT    Recommendations for Other Services       Precautions / Restrictions Precautions Precautions: Fall Restrictions Weight Bearing Restrictions: No    Mobility  Bed Mobility Overal bed mobility: Needs Assistance Bed Mobility: Sit to Supine       Sit to supine: Min assist   General bed mobility comments: Assist to bring legs back up onto bed  Transfers Overall transfer level: Needs assistance Equipment used: Rolling walker (2 wheeled) Transfers: Sit to/from Stand Sit to Stand: Min assist         General transfer comment: Assist to keep feet from sliding forward  Ambulation/Gait Ambulation/Gait assistance: Min guard Ambulation Distance (Feet): 125 Feet Assistive device: Rolling walker (2 wheeled) Gait Pattern/deviations: Step-through pattern;Decreased step length - right;Decreased step length - left;Wide base of support Gait velocity: decr Gait velocity interpretation: Below normal speed for age/gender General Gait Details: Heavy reliance on walker with UE's.    Stairs            Wheelchair Mobility    Modified Rankin (Stroke Patients Only)       Balance   Sitting-balance support: No upper extremity supported Sitting balance-Leahy Scale: Good     Standing balance support: Bilateral upper extremity supported Standing balance-Leahy Scale:  Poor Standing balance comment: support of walker and supervision for static standing.                    Cognition Arousal/Alertness: Awake/alert Behavior During Therapy: WFL for tasks assessed/performed Overall Cognitive Status: Within Functional Limits for tasks assessed Area of Impairment: Memory               General Comments: Pt  speaking on various tangential topics. Difficult to keep him on objective for session.    Exercises      General Comments        Pertinent Vitals/Pain Pain Assessment: 0-10 Pain Score: 8  Pain Location: RLE, bil wrist, head, groin Pain Descriptors / Indicators: Spasm;Aching Pain Intervention(s): Limited activity within patient's tolerance;Repositioned;RN gave pain meds during session    Home Living                      Prior Function            PT Goals (current goals can now be found in the care plan section) Progress towards PT goals: Progressing toward goals    Frequency  Min 3X/week    PT Plan Discharge plan needs to be updated    Co-evaluation             End of Session   Activity Tolerance: Patient tolerated treatment well Patient left: in bed;with call bell/phone within reach;with nursing/sitter in room     Time: 1419-1444 PT Time Calculation (min) (ACUTE ONLY): 25 min  Charges:  $Gait Training: 23-37 mins  G Codes:      Ethan Kasperski 10/21/2015, 3:01 PM [facility] PT [phone]

## 2015-10-21 NOTE — Care Management Important Message (Signed)
Important Message  Patient Details  Name: [name] [name] MRN: [id number] Date of Birth: 03-19-1966   Medicare Important Message Given:  Yes  Patient provided with IM, however patient belligerent with CM waving letter around, demanding for CM to throw it away because it could not be altered to his liking with larger text. CM tried to explain that it is a government form that cannot be altered, however patient continued to talk over CM and ignore explanation. CM threw IM in trash per patient's request.  [name], RN 10/21/2015, 12:03 Elizabeth Message  Patient Details  Name: [name] [name] MRN: [id number] Date of Birth: 1965-10-24   Medicare Important Message Given:  Yes    [name], RN 10/21/2015, 12:03 PM

## 2015-10-21 NOTE — Progress Notes (Signed)
ANTICOAGULATION CONSULT NOTE - Follow Up Consult  Pharmacy Consult for Argatroban bridge to Warfarin Indication: DVT  Allergies  Allergen Reactions  . Pork-Derived Products Anaphylaxis and Other (See Comments)    Fever also   . Ace Inhibitors Cough    Patient Measurements: Height: 6' (182.9 cm) Weight: 284 lb 9.8 oz (129.1 kg) IBW/kg (Calculated) : 77.6 Heparin Dosing Weight:   Vital Signs: Temp: 97.6 F (36.4 C) (01/09 0547) Temp Source: Oral (01/09 0547) BP: 146/41 mmHg (01/09 0945) Pulse Rate: 63 (01/09 0945)  Labs:  Recent Labs  10/19/15 0620 10/19/15 0818 10/20/15 0018 10/20/15 0630 10/21/15 0849  HGB 8.5*  --   --  8.7* 9.3*  HCT 28.5*  --   --  28.6* 32.0*  PLT 157  --   --  197 208  APTT  --   --  57* 59* 58*  LABPROT  --  19.9*  --  21.6* 20.8*  INR  --  1.69*  --  1.89* 1.80*  CREATININE 8.00*  --   --  5.72* 7.94*    Estimated Creatinine Clearance: 15.3 mL/min (by C-G formula based on Cr of 7.94).   Medications:  Scheduled:  . aspirin EC  81 mg Oral Daily  . calcium acetate  2,001 mg Oral TID WC  . cloNIDine  0.3 mg Transdermal Weekly  . darbepoetin (ARANESP) injection - DIALYSIS  100 mcg Intravenous Q Sat-HD  . doxercalciferol  2 mcg Intravenous Q T,Th,Sa-HD  . febuxostat  80 mg Oral Daily  . ferric gluconate (FERRLECIT/NULECIT) IV  125 mg Intravenous Q T,Th,Sa-HD  . insulin aspart  0-20 Units Subcutaneous TID WC  . insulin aspart  0-5 Units Subcutaneous QHS  . insulin aspart  4 Units Subcutaneous TID WC  . insulin glargine  25 Units Subcutaneous QHS  . labetalol  100 mg Oral TID  . pantoprazole  40 mg Oral Daily  . polyethylene glycol  17 g Oral BID  . predniSONE  5 mg Oral Q breakfast  . pregabalin  100 mg Oral QHS  . rosuvastatin  20 mg Oral QHS  . tacrolimus  1 mg Oral BID  . Warfarin - Pharmacist Dosing Inpatient   Does not apply q1800    Assessment: 49yo male on Argartoban bridge to Coumadin, for DVT.  Argatroban has been held  twice, for R/O head bleed (CT neg) and for Tunneled cath placement.  INR goal has not been met in absence of Argatroban effect this admission due to this.  APTT 58, INR 1.8, Hg 9.3, and pltc wnl this AM.  No bleeding noted.  Goal of Therapy:  INR 2-3 aPTT 50-90 seconds Monitor platelets by anticoagulation protocol: Yes   Plan:  Continue Argatroban 0.22mcg/kg/hr Coumadin 7.5mg today Goal INR ~4 with Argatroban x 24hr, with 5 day overlap (which has been met as of today) Watch for s/s of bleeding Daily aPTT, INR, CBC   Kendra Hiatt, PharmD Clinical Pharmacist [facility]    

## 2015-10-21 NOTE — Progress Notes (Signed)
[facility] Progress Note    Assessment/ Plan:   Background: 63M with CKD4T (failing renal allograft d/t CAN/recent ACR with baseline creatinine PTA around 3.6, progressive), admit with acute LLE DVT . Renal function worsened with diuresis, continued to worsen with hyperkalemia, resp fx despite holding diuretics for 2 days, requiring acute HD (1st 1/1) d/t vol overload, hyperkalemia, hypercarbic resp fx.   ASSESSMENT/RECOMMENDATIONS  1. Progressive CKD4T with AKI (ddKT 2009 [facility], CAN, recent ACR tx with IVIG and pulse steroids). Now with failed renal transplant, back on HD-->new ESRD. Has maturing R AVF (09/27/15) and TDC by IR (1/7). CLIP process started. - Anticipate (but not firm yet) that he will be TTS at [facility]. I think EDW will be around 129 kg. - He mentioned going to [facility] but is leaning towards [facility]. - D/C foley 2. Chronic immunosuppression on Tac/MMF/Pred. Tac reduced to 1 mg BID and will taper off over months. Continue prednisone 5 mg/day. MMF stopped altogether d/t leukopenia.  3. Hypertension - Improved with meds and volume removal. 4. Acute LLE DVT - VQ negative. Warfarin was held so that [facility] could be placed - now can resume with argatroban bridge (heparin allergy). Pharmacy managing. 5. 2HPTH on VDRA. PTH 408. Changed calcitriol to IV hectorol with HD since this is VDRA he will get at outpt HD unit. - Phosphorus improving with ca acetate --> continue for now. Will recheck a phos on wed. 6. Porcine Allergy 7. Anemia - TSat low.Started IV Fe load (rec'd 1st dose 1/7) . Darbe 100/wk started with HD 1/7.  8. CHF - markedly improved with volume removal.  9. COPD - compensated 10. DM - per pt controlled w/ A1C of 5.5 and he was very upset about his diet. 11. 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 Gout on febuxostat 13. S/p R BVT 09/27/2015 +B/T 14. Hx/o urethral stricture - has historically been issue w/his voiding problem. May  need to ask Urology to see (they have seen him in the past)  15. FEVER AND LEUKOCYTOSIS 10/15/14. HAD ENTEROCOCCAL ENDOCARDITIS AND OSTEOMYELITIS THIS PAST YEAR. HAS AORTIC VALVE INSUFF. PANCULTURED, STARTED EMPIRIC VANCO AND ZOSYN PER PHARMACY, PULLED FEM LINE AFTER HD (NOTE HE DOES NOT HAVE A PCN ALLERGY) Remains afebrile, cultures negative, may have been related to fem cath. Stop ATB's.  16. MRSA PCR +  Subjective:   Got TDC 1/7 by IR and HD 1/7 with 2 liters off Has remained afebrile since temp fem cath removed Blood cultures negative from 1/4  (empirically covered with vanco and zosyn but now stopped)  Feels well and c/o the food; wants to be on a regular diet. Denies cough, diarrhea, or obstructive sxs. Still has foley (will dc).   Objective:   BP 146/41 mmHg  Pulse 63  Temp(Src) 97.6 F (36.4 C) (Oral)  Resp 18  Ht 6' (1.829 m)  Wt 129.1 kg (284 lb 9.8 oz)  BMI 38.59 kg/m2  SpO2 98%  Intake/Output Summary (Last 24 hours) at 10/21/15 1248 Last data filed at 10/20/15 1807  Gross per 24 hour  Intake    120 ml  Output      0 ml  Net    120 ml   Weight change:   Physical Exam: Pleasant, depressed about being back on HD and unhappy about his diet CV: S1S2 No S3 No rub Lungs anteriorly clear ABD: Non-tender R renal allograft. + BS. EXT:LLE 1+ edema (side of DVT) none on the right Old aneurysmal AVF (  nonfx) left. Arm with fair amount of bruising RUE BVT AVF +B/T (09/27/15) (BEAUTIFUL FISTULA) R IJ TDC (1/7)   Imaging: Ir Fluoro Guide Cv Line Right  10/19/2015  INDICATION: 50 year old male with chronic kidney disease requiring hemodialysis. He recently had line sepsis and has been on a several day line holiday. He has a newly placed right upper extremity arteriovenous fistula which is still in the process of maturing. He needs durable dialysis access until his AVF is fully matured and accessible. EXAM: TUNNELED CENTRAL VENOUS HEMODIALYSIS CATHETER PLACEMENT WITH ULTRASOUND  AND FLUOROSCOPIC GUIDANCE MEDICATIONS: Ancef 2 gm IV; The IV antibiotic was given in an appropriate time interval prior to skin puncture. CONTRAST:  None ANESTHESIA/SEDATION: Versed 1.5 mg IV; Fentanyl 75 mcg IV Total Moderate Sedation Time 16 minutes. FLUOROSCOPY TIME:  36 seconds for a total of 8 mGy COMPLICATIONS: None immediate PROCEDURE: Informed written consent was obtained from the patient after a discussion of the risks, benefits, and alternatives to treatment. Questions regarding the procedure were encouraged and answered. The right neck and chest were prepped with chlorhexidine in a sterile fashion, and a sterile drape was applied covering the operative field. Maximum barrier sterile technique with sterile gowns and gloves were used for the procedure. A timeout was performed prior to the initiation of the procedure. After creating a small venotomy incision, a micropuncture kit was utilized to access the right internal jugular vein under direct, real-time ultrasound guidance after the overlying soft tissues were anesthetized with 1% lidocaine with epinephrine. Ultrasound image documentation was performed. The microwire was kinked to measure appropriate catheter length. A stiff glidewire was advanced to the level of the IVC and the micropuncture sheath was exchanged for a peel-away sheath. A Palindrome tunneled hemodialysis catheter measuring 23 cm from tip to cuff was tunneled in a retrograde fashion from the anterior chest wall to the venotomy incision. The catheter was then placed through the peel-away sheath with tips ultimately positioned within the superior aspect of the right atrium. Final catheter positioning was confirmed and documented with a spot radiographic image. The catheter aspirates and flushes normally. The catheter was flushed with appropriate volume heparin dwells. The catheter exit site was secured with a 0-Prolene retention suture. The venotomy incision was closed with Dermabond.  Dressings were applied. The patient tolerated the procedure well without immediate post procedural complication. IMPRESSION: Successful placement of 23 cm tip to cuff tunneled hemodialysis catheter via the right internal jugular vein with tips terminating within the superior aspect of the right atrium. The catheter is ready for immediate use. Signed, Criselda Peaches, MD [facility] Electronically Signed   By: Jacqulynn Cadet M.D.   On: 10/19/2015 13:01   Ir US Guide Vasc Access Right  10/19/2015  INDICATION: 50 year old male with chronic kidney disease requiring hemodialysis. He recently had line sepsis and has been on a several day line holiday. He has a newly placed right upper extremity arteriovenous fistula which is still in the process of maturing. He needs durable dialysis access until his AVF is fully matured and accessible. EXAM: TUNNELED CENTRAL VENOUS HEMODIALYSIS CATHETER PLACEMENT WITH ULTRASOUND AND FLUOROSCOPIC GUIDANCE MEDICATIONS: Ancef 2 gm IV; The IV antibiotic was given in an appropriate time interval prior to skin puncture. CONTRAST:  None ANESTHESIA/SEDATION: Versed 1.5 mg IV; Fentanyl 75 mcg IV Total Moderate Sedation Time 16 minutes. FLUOROSCOPY TIME:  36 seconds for a total of 8 mGy COMPLICATIONS: None immediate PROCEDURE: Informed written consent was obtained from  the patient after a discussion of the risks, benefits, and alternatives to treatment. Questions regarding the procedure were encouraged and answered. The right neck and chest were prepped with chlorhexidine in a sterile fashion, and a sterile drape was applied covering the operative field. Maximum barrier sterile technique with sterile gowns and gloves were used for the procedure. A timeout was performed prior to the initiation of the procedure. After creating a small venotomy incision, a micropuncture kit was utilized to access the right internal jugular vein under  direct, real-time ultrasound guidance after the overlying soft tissues were anesthetized with 1% lidocaine with epinephrine. Ultrasound image documentation was performed. The microwire was kinked to measure appropriate catheter length. A stiff glidewire was advanced to the level of the IVC and the micropuncture sheath was exchanged for a peel-away sheath. A Palindrome tunneled hemodialysis catheter measuring 23 cm from tip to cuff was tunneled in a retrograde fashion from the anterior chest wall to the venotomy incision. The catheter was then placed through the peel-away sheath with tips ultimately positioned within the superior aspect of the right atrium. Final catheter positioning was confirmed and documented with a spot radiographic image. The catheter aspirates and flushes normally. The catheter was flushed with appropriate volume heparin dwells. The catheter exit site was secured with a 0-Prolene retention suture. The venotomy incision was closed with Dermabond. Dressings were applied. The patient tolerated the procedure well without immediate post procedural complication. IMPRESSION: Successful placement of 23 cm tip to cuff tunneled hemodialysis catheter via the right internal jugular vein with tips terminating within the superior aspect of the right atrium. The catheter is ready for immediate use. Signed, Criselda Peaches, MD [facility] Radiology Electronically Signed   By: Jacqulynn Cadet M.D.   On: 10/19/2015 13:01    Labs: BMET  Recent Labs Lab 10/15/15 0335 10/16/15 0517 10/17/15 0645 10/18/15 0853 10/19/15 0620 10/20/15 0630 10/21/15 0849  NA 141 140 136 135 137 138 138  K 4.8 4.9 4.5 4.9 5.1 4.2 4.5  CL 104 102 100* 101 99* 97* 98*  CO2 27 26 27 22 25 30 28   GLUCOSE 159* 233* 276* 375* 306* 278* 218*  BUN 51* 64* 46* 63* 70* 34* 45*  CREATININE 3.54* 4.74* 4.41* 6.34* 8.00* 5.72* 7.94*  CALCIUM 7.5* 7.8* 7.7* 7.5* 7.5* 7.2*  7.7*  PHOS 5.2* 4.9* 5.7* 6.6* 8.2* 5.7* 7.9*   CBC  Recent Labs Lab 10/18/15 1205 10/19/15 0620 10/20/15 0630 10/21/15 0849  WBC 4.3 3.0* 3.7* 4.2  HGB 8.4* 8.5* 8.7* 9.3*  HCT 28.1* 28.5* 28.6* 32.0*  MCV 92.1 92.8 91.4 93.6  PLT 181 157 197 208    Medications:    . aspirin EC  81 mg Oral Daily  . calcium acetate  2,001 mg Oral TID WC  . cloNIDine  0.3 mg Transdermal Weekly  . darbepoetin (ARANESP) injection - DIALYSIS  100 mcg Intravenous Q Sat-HD  . doxercalciferol  2 mcg Intravenous Q T,Th,Sa-HD  . febuxostat  80 mg Oral Daily  . ferric gluconate (FERRLECIT/NULECIT) IV  125 mg Intravenous Q T,Th,Sa-HD  . insulin aspart  0-20 Units Subcutaneous TID WC  . insulin aspart  0-5 Units Subcutaneous QHS  . insulin aspart  4 Units Subcutaneous TID WC  . insulin glargine  25 Units Subcutaneous QHS  . labetalol  100 mg Oral TID  . pantoprazole  40 mg Oral Daily  . polyethylene glycol  17 g Oral BID  . predniSONE  5 mg Oral Q breakfast  . pregabalin  100 mg Oral QHS  . rosuvastatin  20 mg Oral QHS  . tacrolimus  1 mg Oral BID  . warfarin  7.5 mg Oral ONCE-1800  . Warfarin - Pharmacist Dosing Inpatient   Does not apply q1800      Otelia Santee, MD 10/21/2015, 12:48 PM

## 2015-10-21 NOTE — Progress Notes (Signed)
[facility] PROGRESS NOTE  [name] [name] [id number] DOB: Jun 29, 1966 DOA: 10/07/2015 PCP: Hoyt Koch, MD Brief history: 49 y.o. male with multiple medical problems not limited to renal transplant, chronic kidney disease, diabetes and obesity, a new HD  dialysis fistula placed 09/28/15, presents with left lower extremity swelling and altered mental status.  Assessment/Plan: 1. Acute hypercarbic respiratory failure: - resolved. Wean him off oxygen as appropriate. Currently on 2 lit of [place] oxygen.   2. Hypertension: Better controlled. Resume home meds.   3. STAGE 4 ckd  NEW HD pt.  S/p renal transplant.  Hyperkalemia resolved.  Resume prograf  and prednisone. Cell cept held for leukopenia. HD catheter placement on 1/7 by IR and plan for HD later tonight.  Post HD femoral HD catheter is removed 1/4 Clip in process.   4. Anemia of chronic disease: - stable.   5. Type 2 DM: CBG (last 3)   Recent Labs  10/21/15 0823 10/21/15 1155 10/21/15 1726  GLUCAP 274* 204* 167*    Resume SSI.  Increased to resistant scale.  hgba1c 7.7 Added 4 units of novolog tidac to the mod scale. increased lantus to 25 units daily.  hyperglycemia Probably from the prednisone.   6. Urinary retention: probably from urethral stricture, would keep the foley in place and outpatient urology work up.  Foley insertion.   7. Fever: blood cultures drawn on 1/5 after the HD and was empirically started on vancomycin and zosyn. CXR shows mild vascular congestion. He has been afebrile since the femoral HD catheter was resolved. Discontinued vancomycin and zosyn.   8. DVT of the LLE on argatroban,  Argatroban held for the procedure for IR HD, and restarted back after the HD catheter placement. Coumadin restarted.   9. Acute encephalopathy: resolved with resolution of the respiratory issues.    10. Acute on chronic diastolic heart failure: Appears compensated .    11. COPD: No wheezing  heard. Stable.       Code Status: full code.  Family Communication: none at bedside.  Disposition Plan: SNF  Vs home health PT when CLIP in place.    Consultants:  PCCM  Renal.   Procedures:  HD 1/4, 1/7  Antibiotics:  Vancomycin dc/ed on 1/8  Zosyn discontinued with 1/8  HPI/Subjective: Breathing improved. No new complaints.   requesting letter for work.  Wants to go home. Objective: Filed Vitals:   10/21/15 1540 10/21/15 1736  BP: 125/31 137/43  Pulse: 61 58  Temp:    Resp:      Intake/Output Summary (Last 24 hours) at 10/21/15 1950 Last data filed at 10/21/15 1300  Gross per 24 hour  Intake    360 ml  Output      0 ml  Net    360 ml   Filed Weights   10/16/15 1740 10/19/15 1600 10/19/15 2015  Weight: 129.5 kg (285 lb 7.9 oz) 131 kg (288 lb 12.8 oz) 129.1 kg (284 lb 9.8 oz)    Exam:   General:  Alert comfortable. Currently on 2 lit [place] oxygen.   Cardiovascular: s1s2  Respiratory: diminished at bases.   Abdomen soft NT nd bs+  Musculoskeletal: pedal edema.   Data Reviewed: Basic Metabolic Panel:  Recent Labs Lab 10/17/15 0645 10/18/15 0853 10/19/15 0620 10/20/15 0630 10/21/15 0849  NA 136 135 137 138 138  K 4.5 4.9 5.1 4.2 4.5  CL 100* 101 99* 97* 98*  CO2 27 22 25 30 28   GLUCOSE 276* 375* 306*  278* 218*  BUN 46* 63* 70* 34* 45*  CREATININE 4.41* 6.34* 8.00* 5.72* 7.94*  CALCIUM 7.7* 7.5* 7.5* 7.2* 7.7*  PHOS 5.7* 6.6* 8.2* 5.7* 7.9*   Liver Function Tests:  Recent Labs Lab 10/15/15 0335  10/17/15 0645 10/18/15 0853 10/19/15 0620 10/20/15 0630 10/21/15 0849  AST 15  --   --   --   --   --   --   ALT 11*  --   --   --   --   --   --   ALKPHOS 81  --   --   --   --   --   --   BILITOT 1.2  --   --   --   --   --   --   PROT 4.6*  --   --   --   --   --   --   ALBUMIN 2.5*  < > 2.1* 2.4* 2.2* 2.0* 2.4*  < > = values in this interval not displayed. No results for input(s): LIPASE, AMYLASE in the last 168 hours. No  results for input(s): AMMONIA in the last 168 hours. CBC:  Recent Labs Lab 10/17/15 0645 10/18/15 1205 10/19/15 0620 10/20/15 0630 10/21/15 0849  WBC 11.3* 4.3 3.0* 3.7* 4.2  HGB 8.9* 8.4* 8.5* 8.7* 9.3*  HCT 29.1* 28.1* 28.5* 28.6* 32.0*  MCV 91.8 92.1 92.8 91.4 93.6  PLT 179 181 157 197 208   Cardiac Enzymes: No results for input(s): CKTOTAL, CKMB, CKMBINDEX, TROPONINI in the last 168 hours. BNP (last 3 results)  Recent Labs  10/07/15 1145  BNP 1430.7*    ProBNP (last 3 results) No results for input(s): PROBNP in the last 8760 hours.  CBG:  Recent Labs Lab 10/20/15 1633 10/20/15 2254 10/21/15 0823 10/21/15 1155 10/21/15 1726  GLUCAP 271* 173* 274* 204* 167*    Recent Results (from the past 240 hour(s))  MRSA PCR Screening     Status: Abnormal   Collection Time: 10/13/15 11:28 AM  Result Value Ref Range Status   MRSA by PCR POSITIVE (A) NEGATIVE Final    Comment:        The GeneXpert MRSA Assay (FDA approved for NASAL specimens only), is one component of a comprehensive MRSA colonization surveillance program. It is not intended to diagnose MRSA infection nor to guide or monitor treatment for MRSA infections. RESULT CALLED TO, READ BACK BY AND VERIFIED WITH: C. SCHILLER RN 10/13/15 AT 1300 BY A. DAVIS   Culture, blood (Routine X 2) w Reflex to ID Panel     Status: None   Collection Time: 10/16/15  2:15 PM  Result Value Ref Range Status   Specimen Description BLOOD HEMODIALYSIS CATHETER  Final   Special Requests BOTTLES DRAWN AEROBIC AND ANAEROBIC 10CC  Final   Culture NO GROWTH 5 DAYS  Final   Report Status 10/21/2015 FINAL  Final  Culture, blood (Routine X 2) w Reflex to ID Panel     Status: None   Collection Time: 10/16/15  2:30 PM  Result Value Ref Range Status   Specimen Description BLOOD HEMODIALYSIS CATHETER  Final   Special Requests BOTTLES DRAWN AEROBIC AND ANAEROBIC 10CC  Final   Culture NO GROWTH 5 DAYS  Final   Report Status 10/21/2015  FINAL  Final     Studies: No results found.  Scheduled Meds: . aspirin EC  81 mg Oral Daily  . calcium acetate  2,001 mg Oral TID WC  . cloNIDine  0.3 mg Transdermal Weekly  . darbepoetin (ARANESP) injection - DIALYSIS  100 mcg Intravenous Q Sat-HD  . doxercalciferol  2 mcg Intravenous Q T,Th,Sa-HD  . febuxostat  80 mg Oral Daily  . ferric gluconate (FERRLECIT/NULECIT) IV  125 mg Intravenous Q T,Th,Sa-HD  . insulin aspart  0-20 Units Subcutaneous TID WC  . insulin aspart  0-5 Units Subcutaneous QHS  . insulin aspart  4 Units Subcutaneous TID WC  . insulin glargine  25 Units Subcutaneous QHS  . labetalol  100 mg Oral TID  . pantoprazole  40 mg Oral Daily  . polyethylene glycol  17 g Oral BID  . predniSONE  5 mg Oral Q breakfast  . pregabalin  100 mg Oral QHS  . rosuvastatin  20 mg Oral QHS  . tacrolimus  1 mg Oral BID  . Warfarin - Pharmacist Dosing Inpatient   Does not apply q1800   Continuous Infusions: . argatroban 0.22 mcg/kg/min (10/21/15 0422)    Principal Problem:   Deep vein thrombosis (DVT) of left lower extremity (HCC) Active Problems:   Obstructive sleep apnea   KIDNEY TRANSPLANTATION, HX OF   Benign essential HTN   H/O kidney transplant   Diabetes mellitus, type 2 (HCC)   Diabetic peripheral neuropathy (HCC)   Chronic kidney disease (CKD), stage IV (severe) (HCC)   CHF (congestive heart failure) (HCC)   Hyperkalemia   Prolonged Q-T interval on ECG   Chronic deep vein thrombosis (DVT) of left lower extremity ([place])   Acute on chronic congestive heart failure ([place])   Acute pulmonary edema ([place])    Time spent: 25 min    [facility] Pager [phone]. If 7PM-7AM, please contact night-coverage at [email or web address], password [facility] 10/21/2015, 7:50 PM  LOS: 14 days

## 2015-10-22 LAB — CBC
HCT: 32.6 % — ABNORMAL LOW (ref 39.0–52.0)
Hemoglobin: 9.5 g/dL — ABNORMAL LOW (ref 13.0–17.0)
MCH: 27.4 pg (ref 26.0–34.0)
MCHC: 29.1 g/dL — ABNORMAL LOW (ref 30.0–36.0)
MCV: 93.9 fL (ref 78.0–100.0)
Platelets: 237 10*3/uL (ref 150–400)
RBC: 3.47 MIL/uL — ABNORMAL LOW (ref 4.22–5.81)
RDW: 14.9 % (ref 11.5–15.5)
WBC: 4.4 10*3/uL (ref 4.0–10.5)

## 2015-10-22 LAB — APTT: aPTT: 58 seconds — ABNORMAL HIGH (ref 24–37)

## 2015-10-22 LAB — RENAL FUNCTION PANEL
Albumin: 2.3 g/dL — ABNORMAL LOW (ref 3.5–5.0)
Anion gap: 10 (ref 5–15)
BUN: 53 mg/dL — ABNORMAL HIGH (ref 6–20)
CO2: 28 mmol/L (ref 22–32)
Calcium: 7.2 mg/dL — ABNORMAL LOW (ref 8.9–10.3)
Chloride: 99 mmol/L — ABNORMAL LOW (ref 101–111)
Creatinine, Ser: 9.46 mg/dL — ABNORMAL HIGH (ref 0.61–1.24)
GFR calc Af Amer: 7 mL/min — ABNORMAL LOW (ref >60)
GFR calc non Af Amer: 6 mL/min — ABNORMAL LOW (ref >60)
Glucose, Bld: 245 mg/dL — ABNORMAL HIGH (ref 65–99)
Phosphorus: 7.8 mg/dL — ABNORMAL HIGH (ref 2.5–4.6)
Potassium: 4.5 mmol/L (ref 3.5–5.1)
Sodium: 137 mmol/L (ref 135–145)

## 2015-10-22 LAB — GLUCOSE, CAPILLARY
Glucose-Capillary: 231 mg/dL — ABNORMAL HIGH (ref 65–99)
Glucose-Capillary: 264 mg/dL — ABNORMAL HIGH (ref 65–99)
Glucose-Capillary: 197 mg/dL — ABNORMAL HIGH (ref 65–99)

## 2015-10-22 LAB — PROTIME-INR
INR: 1.98 — ABNORMAL HIGH (ref 0.00–1.49)
Prothrombin Time: 22.4 seconds — ABNORMAL HIGH (ref 11.6–15.2)

## 2015-10-22 MED ORDER — DOXERCALCIFEROL 4 MCG/2ML IV SOLN
INTRAVENOUS | Status: AC
  Filled 2015-10-22: qty 2

## 2015-10-22 MED ORDER — WARFARIN SODIUM 7.5 MG PO TABS
7.5000 mg | ORAL_TABLET | Freq: Once | ORAL | Status: AC
  Administered 2015-10-22: 7.5 mg via ORAL
  Filled 2015-10-22: qty 1

## 2015-10-22 NOTE — Progress Notes (Signed)
ANTICOAGULATION CONSULT NOTE - Follow Up  Pharmacy Consult for Argatroban and warfarin Indication: DVT  Patient Measurements: Height: 6' (182.9 cm) Weight: 287 lb 11.2 oz (130.5 kg) IBW/kg (Calculated) : 77.6  Vital Signs: Temp: 98.2 F (36.8 C) (01/10 1221) Temp Source: Oral (01/10 1221) BP: 146/42 mmHg (01/10 1221) Pulse Rate: 81 (01/10 1221)  Labs:  Recent Labs  10/20/15 0630 10/21/15 0849 10/22/15 0450  HGB 8.7* 9.3* 9.5*  HCT 28.6* 32.0* 32.6*  PLT 197 208 237  APTT 59* 58* 58*  LABPROT 21.6* 20.8* 22.4*  INR 1.89* 1.80* 1.98*  CREATININE 5.72* 7.94* 9.46*    Assessment: 50 yo M admitted 12/26 and found to have DVT left popliteal vein. VQ negative for PE.  Argatroban was started on 12/26  due to porcine allergy and patient was initiated on Coumadin.  However, Argatroban was held d/t concern for head bleed and warfarin was held for placement of tunneled HD catheter. Now both are resumed. APTT remains therapeutic on 0.27mcg/kg/min.  INR 1.98 this morning.  Goal of Therapy:  INR 2-3 (without influence of Argatroban) aPTT 50-90 seconds Monitor platelets by anticoagulation protocol: Yes    Plan:  -continue Argatroban at 1.52mL/hr (0.67mcg/kg/min) -warfarin 7.5mg  po x1 tonight -daily aPTT, CBC and INR  [employer], Pharm.D., BCPS Clinical Pharmacist Pager [phone] 10/22/2015 2:57 PM

## 2015-10-22 NOTE — Care Management Note (Signed)
Case Management Note  Patient Details  Name: [name] [name] MRN: [id number] Date of Birth: 14-May-1966  Subjective/Objective:                  Spoke with patient in the room, explained to patient that PT recommendation is [facility] PT, (patient has also been refusing SNF placement this admission). Patient stating that he does not want HH PT because they "stay 2 minutes" and he would like to go to SNF now. Explained to patient that since he has progressed with PT and does not have any other needs for SNF that he can go to a SNF however he would have to pay for his stay out of pocket. Patient wanted CM draw up a letter stating this and sign it. I stated to patient that I would not be providing him such a letter, that I have provided him his options for home health follow up, vs SNF out of pocket. I left my business card with patient per his request.   Action/Plan:  Patient refuses CM resources. CM signing off.  Expected Discharge Date:                  Expected Discharge Plan:  Home/Self Care  In-House Referral:  Clinical Social Work  Discharge planning Services  CM Consult  Post Acute Care Choice:    Choice offered to:     DME Arranged:    DME Agency:     HH Arranged:    [place] Agency:     Status of Service:  Completed, signed off  Medicare Important Message Given:  Yes Date Medicare IM Given:    Medicare IM give by:    Date Additional Medicare IM Given:    Additional Medicare Important Message give by:     If discussed at [employer] of Stay Meetings, dates discussed:    Additional Comments:  [name], RN 10/22/2015, 1:53 PM

## 2015-10-22 NOTE — Progress Notes (Signed)
Spoke with Bethena Roys in HD, patient is not clipped yet, updated Bethena Roys that patient will be going home at discharge.

## 2015-10-22 NOTE — Progress Notes (Signed)
[facility] PROGRESS NOTE  [name] [name] [id number] DOB: April 11, 1966 DOA: 10/07/2015 PCP: Hoyt Koch, MD Brief history: 50 y.o. male with multiple medical problems not limited to renal transplant, chronic kidney disease, diabetes and obesity, a new HD  dialysis fistula placed 09/28/15, presents with left lower extremity swelling and altered mental status.  Assessment/Plan: 1. Acute hypercarbic respiratory failure: - resolved. Wean him off oxygen as appropriate. Currently on 2 lit of [place] oxygen.   2. Hypertension: Better controlled. Resume home meds.   3. STAGE 4 ckd  NEW HD pt.  S/p renal transplant.  Hyperkalemia resolved.  Resume prograf  and prednisone. Cell cept held for leukopenia. HD catheter placement on 1/7 by IR and plan for HD later tonight.  Post HD femoral HD catheter is removed 1/4, because he was spiking fevers. Once the femoral HD catheter removed he defervesced and his antibiotics were discontinued.  Clip in process.   4. Anemia of chronic disease: - stable.   5. Type 2 DM: CBG (last 3)   Recent Labs  10/21/15 1726 10/21/15 2151 10/22/15 1156  GLUCAP 167* 147* 231*    Resume SSI.  Increased to resistant scale.  hgba1c 7.7 Increased to 5 units of novolog tidac to the mod scale. increased lantus to 25 units daily.  hyperglycemia Probably from the prednisone.   6. Urinary retention: probably from urethral stricture, would keep the foley in place and outpatient urology work up.  His foley was removed yesterday as per renal recommendations.   7. Fever: blood cultures drawn on 1/5 after the HD and was empirically started on vancomycin and zosyn. CXR shows mild vascular congestion. He has been afebrile since the femoral HD catheter was resolved. Discontinued vancomycin and zosyn.   8. DVT of the LLE on argatroban,  Argatroban held for the procedure for IR HD, and restarted back after the HD catheter placement. Coumadin restarted. INR is  1.98  9. Acute encephalopathy: resolved with resolution of the respiratory issues.    10. Acute on chronic diastolic heart failure: Appears compensated .    11. COPD: No wheezing heard. Stable.       Code Status: full code.  Family Communication: none at bedside.  Disposition Plan: home health PT when CLIP in place.    Consultants:  PCCM  Renal.   Procedures:  HD 1/4, 1/7  Antibiotics:  Vancomycin dc/ed on 1/8  Zosyn discontinued with 1/8  HPI/Subjective: Breathing improved. No new complaints.   requesting letter for work.  Wants to go home. Objective: Filed Vitals:   10/22/15 1120 10/22/15 1221  BP: 135/49 146/42  Pulse: 64 81  Temp: 97.7 F (36.5 C) 98.2 F (36.8 C)  Resp: 20 20    Intake/Output Summary (Last 24 hours) at 10/22/15 1308 Last data filed at 10/22/15 1120  Gross per 24 hour  Intake      0 ml  Output   1900 ml  Net  -1900 ml   Filed Weights   10/19/15 1600 10/19/15 2015 10/22/15 0705  Weight: 131 kg (288 lb 12.8 oz) 129.1 kg (284 lb 9.8 oz) 130.5 kg (287 lb 11.2 oz)    Exam:   General:  Alert comfortable. Currently on 2 lit [place] oxygen.   Cardiovascular: s1s2  Respiratory: diminished at bases.   Abdomen soft NT nd bs+  Musculoskeletal: pedal edema.   Data Reviewed: Basic Metabolic Panel:  Recent Labs Lab 10/18/15 0853 10/19/15 0620 10/20/15 0630 10/21/15 0849 10/22/15 0450  NA 135 137  138 138 137  K 4.9 5.1 4.2 4.5 4.5  CL 101 99* 97* 98* 99*  CO2 22 25 30 28 28   GLUCOSE 375* 306* 278* 218* 245*  BUN 63* 70* 34* 45* 53*  CREATININE 6.34* 8.00* 5.72* 7.94* 9.46*  CALCIUM 7.5* 7.5* 7.2* 7.7* 7.2*  PHOS 6.6* 8.2* 5.7* 7.9* 7.8*   Liver Function Tests:  Recent Labs Lab 10/18/15 0853 10/19/15 0620 10/20/15 0630 10/21/15 0849 10/22/15 0450  ALBUMIN 2.4* 2.2* 2.0* 2.4* 2.3*   No results for input(s): LIPASE, AMYLASE in the last 168 hours. No results for input(s): AMMONIA in the last 168  hours. CBC:  Recent Labs Lab 10/18/15 1205 10/19/15 0620 10/20/15 0630 10/21/15 0849 10/22/15 0450  WBC 4.3 3.0* 3.7* 4.2 4.4  HGB 8.4* 8.5* 8.7* 9.3* 9.5*  HCT 28.1* 28.5* 28.6* 32.0* 32.6*  MCV 92.1 92.8 91.4 93.6 93.9  PLT 181 157 197 208 237   Cardiac Enzymes: No results for input(s): CKTOTAL, CKMB, CKMBINDEX, TROPONINI in the last 168 hours. BNP (last 3 results)  Recent Labs  10/07/15 1145  BNP 1430.7*    ProBNP (last 3 results) No results for input(s): PROBNP in the last 8760 hours.  CBG:  Recent Labs Lab 10/21/15 0823 10/21/15 1155 10/21/15 1726 10/21/15 2151 10/22/15 1156  GLUCAP 274* 204* 167* 147* 231*    Recent Results (from the past 240 hour(s))  MRSA PCR Screening     Status: Abnormal   Collection Time: 10/13/15 11:28 AM  Result Value Ref Range Status   MRSA by PCR POSITIVE (A) NEGATIVE Final    Comment:        The GeneXpert MRSA Assay (FDA approved for NASAL specimens only), is one component of a comprehensive MRSA colonization surveillance program. It is not intended to diagnose MRSA infection nor to guide or monitor treatment for MRSA infections. RESULT CALLED TO, READ BACK BY AND VERIFIED WITH: C. SCHILLER RN 10/13/15 AT 1300 BY A. DAVIS   Culture, blood (Routine X 2) w Reflex to ID Panel     Status: None   Collection Time: 10/16/15  2:15 PM  Result Value Ref Range Status   Specimen Description BLOOD HEMODIALYSIS CATHETER  Final   Special Requests BOTTLES DRAWN AEROBIC AND ANAEROBIC 10CC  Final   Culture NO GROWTH 5 DAYS  Final   Report Status 10/21/2015 FINAL  Final  Culture, blood (Routine X 2) w Reflex to ID Panel     Status: None   Collection Time: 10/16/15  2:30 PM  Result Value Ref Range Status   Specimen Description BLOOD HEMODIALYSIS CATHETER  Final   Special Requests BOTTLES DRAWN AEROBIC AND ANAEROBIC 10CC  Final   Culture NO GROWTH 5 DAYS  Final   Report Status 10/21/2015 FINAL  Final     Studies: No results  found.  Scheduled Meds: . aspirin EC  81 mg Oral Daily  . calcium acetate  2,001 mg Oral TID WC  . cloNIDine  0.3 mg Transdermal Weekly  . darbepoetin (ARANESP) injection - DIALYSIS  100 mcg Intravenous Q Sat-HD  . doxercalciferol  2 mcg Intravenous Q T,Th,Sa-HD  . febuxostat  80 mg Oral Daily  . ferric gluconate (FERRLECIT/NULECIT) IV  125 mg Intravenous Q T,Th,Sa-HD  . insulin aspart  0-20 Units Subcutaneous TID WC  . insulin aspart  0-5 Units Subcutaneous QHS  . insulin aspart  4 Units Subcutaneous TID WC  . insulin glargine  25 Units Subcutaneous QHS  . labetalol  100 mg  Oral TID  . pantoprazole  40 mg Oral Daily  . polyethylene glycol  17 g Oral BID  . predniSONE  5 mg Oral Q breakfast  . pregabalin  100 mg Oral QHS  . rosuvastatin  20 mg Oral QHS  . tacrolimus  1 mg Oral BID  . Warfarin - Pharmacist Dosing Inpatient   Does not apply q1800   Continuous Infusions: . argatroban 0.22 mcg/kg/min (10/21/15 0422)    Principal Problem:   Deep vein thrombosis (DVT) of left lower extremity (HCC) Active Problems:   Obstructive sleep apnea   KIDNEY TRANSPLANTATION, HX OF   Benign essential HTN   H/O kidney transplant   Diabetes mellitus, type 2 (HCC)   Diabetic peripheral neuropathy (HCC)   Chronic kidney disease (CKD), stage IV (severe) (HCC)   CHF (congestive heart failure) (HCC)   Hyperkalemia   Prolonged Q-T interval on ECG   Chronic deep vein thrombosis (DVT) of left lower extremity ([place])   Acute on chronic congestive heart failure ([place])   Acute pulmonary edema ([place])    Time spent: 15 min    [facility] Pager [phone]. If 7PM-7AM, please contact night-coverage at [email or web address], password [facility] 10/22/2015, 1:08 PM  LOS: 15 days

## 2015-10-22 NOTE — Progress Notes (Signed)
Inpatient Diabetes Program Recommendations  AACE/ADA: New Consensus Statement on Inpatient Glycemic Control (2015)  Target Ranges:  Prepandial:   less than 140 mg/dL      Peak postprandial:   less than 180 mg/dL (1-2 hours)      Critically ill patients:  140 - 180 mg/dL   Inpatient Diabetes Program Recommendations: Fasting cbg's continue to be high in 200's. Please consider increase in lantus to at least 35 units to start in addition to present regimen.  Thank you Rosita Kea, RN, MSN, CDE  Diabetes Inpatient Program Office: [phone] Pager: [phone] 8:00 am to 5:00 pm

## 2015-10-22 NOTE — Procedures (Signed)
Patient was seen on dialysis and the procedure was supervised.   BFR 400 Via RIJ catheter  BP is 106/52.  AP -180  VP 150 UF 3L [place] 2/2.25  Patient appears to be tolerating treatment well; no change to regimen.  Otelia Santee, MD 10/22/2015, 9:51 AM

## 2015-10-23 ENCOUNTER — Other Ambulatory Visit: Payer: Self-pay | Admitting: *Deleted

## 2015-10-23 DIAGNOSIS — N186 End stage renal disease: Secondary | ICD-10-CM

## 2015-10-23 DIAGNOSIS — Z992 Dependence on renal dialysis: Secondary | ICD-10-CM

## 2015-10-23 DIAGNOSIS — Z794 Long term (current) use of insulin: Secondary | ICD-10-CM

## 2015-10-23 DIAGNOSIS — Z940 Kidney transplant status: Secondary | ICD-10-CM

## 2015-10-23 DIAGNOSIS — E1121 Type 2 diabetes mellitus with diabetic nephropathy: Secondary | ICD-10-CM

## 2015-10-23 LAB — CBC
HCT: 30.8 % — ABNORMAL LOW (ref 39.0–52.0)
Hemoglobin: 8.9 g/dL — ABNORMAL LOW (ref 13.0–17.0)
MCH: 26.8 pg (ref 26.0–34.0)
MCHC: 28.9 g/dL — ABNORMAL LOW (ref 30.0–36.0)
MCV: 92.8 fL (ref 78.0–100.0)
Platelets: 243 10*3/uL (ref 150–400)
RBC: 3.32 MIL/uL — ABNORMAL LOW (ref 4.22–5.81)
RDW: 14.8 % (ref 11.5–15.5)
WBC: 5.4 10*3/uL (ref 4.0–10.5)

## 2015-10-23 LAB — RENAL FUNCTION PANEL
Albumin: 2.3 g/dL — ABNORMAL LOW (ref 3.5–5.0)
Anion gap: 10 (ref 5–15)
BUN: 32 mg/dL — ABNORMAL HIGH (ref 6–20)
CO2: 29 mmol/L (ref 22–32)
Calcium: 7.5 mg/dL — ABNORMAL LOW (ref 8.9–10.3)
Chloride: 98 mmol/L — ABNORMAL LOW (ref 101–111)
Creatinine, Ser: 7.33 mg/dL — ABNORMAL HIGH (ref 0.61–1.24)
GFR calc Af Amer: 9 mL/min — ABNORMAL LOW (ref >60)
GFR calc non Af Amer: 8 mL/min — ABNORMAL LOW (ref >60)
Glucose, Bld: 227 mg/dL — ABNORMAL HIGH (ref 65–99)
Phosphorus: 6 mg/dL — ABNORMAL HIGH (ref 2.5–4.6)
Potassium: 4.3 mmol/L (ref 3.5–5.1)
Sodium: 137 mmol/L (ref 135–145)

## 2015-10-23 LAB — GLUCOSE, CAPILLARY
Glucose-Capillary: 202 mg/dL — ABNORMAL HIGH (ref 65–99)
Glucose-Capillary: 300 mg/dL — ABNORMAL HIGH (ref 65–99)
Glucose-Capillary: 284 mg/dL — ABNORMAL HIGH (ref 65–99)
Glucose-Capillary: 284 mg/dL — ABNORMAL HIGH (ref 65–99)

## 2015-10-23 LAB — PROTIME-INR
INR: 2.03 — ABNORMAL HIGH (ref 0.00–1.49)
Prothrombin Time: 22.8 seconds — ABNORMAL HIGH (ref 11.6–15.2)

## 2015-10-23 LAB — APTT: aPTT: 67 seconds — ABNORMAL HIGH (ref 24–37)

## 2015-10-23 MED ORDER — INSULIN REGULAR HUMAN (CONC) 500 UNIT/ML ~~LOC~~ SOPN: PEN_INJECTOR | SUBCUTANEOUS | Status: DC

## 2015-10-23 MED ORDER — WARFARIN SODIUM 7.5 MG PO TABS
7.5000 mg | ORAL_TABLET | Freq: Once | ORAL | Status: AC
  Administered 2015-10-23: 7.5 mg via ORAL
  Filled 2015-10-23: qty 1

## 2015-10-23 MED ORDER — ALBUTEROL SULFATE (2.5 MG/3ML) 0.083% IN NEBU: 2.5000 mg | INHALATION_SOLUTION | RESPIRATORY_TRACT | Status: DC | PRN

## 2015-10-23 NOTE — Progress Notes (Signed)
[facility] Progress Note    Assessment/ Plan:    Assessment/ Plan:   Background: 40M with CKD4T (failing renal allograft d/t CAN/recent ACR with baseline creatinine PTA around 3.6, progressive), admit with acute LLE DVT . Renal function worsened with diuresis, continued to worsen with hyperkalemia, resp fx despite holding diuretics for 2 days, requiring acute HD (1st 1/1) d/t vol overload, hyperkalemia, hypercarbic resp fx.   ASSESSMENT/RECOMMENDATIONS  1. Progressive CKD4T with AKI (ddKT 2009 [facility], CAN, recent ACR tx with IVIG and pulse steroids). Now with failed renal transplant, back on HD-->new ESRD. Has maturing R AVF (09/27/15) and TDC by IR (1/7). CLIP process started. -  I think EDW will be around 129 kg. - He mentioned going to [facility] but is leaning towards [facility]. - foley d/c on 1/10  - He has a TTS first shift spot at [employer]. He needs to be there the day before 1st HD day at [facility] to fill out paperwork. If he's not able to leave soon today then he will need to be there on Friday before starting on Sat. - Plan on HD tomorrow.  2. Chronic immunosuppression on Tac/MMF/Pred. Tac reduced to 1 mg BID and will taper off over months. Continue prednisone 5 mg/day. MMF stopped altogether d/t leukopenia.  3. Hypertension - Improved with meds and volume removal. 4. Acute LLE DVT - VQ negative. Warfarin was held so that [facility] could be placed - now can resume with argatroban bridge (heparin allergy). Pharmacy managing. 5. 2HPTH on VDRA. PTH 408. Changed calcitriol to IV hectorol with HD since this is VDRA he will get at outpt HD unit. - Phosphorus improving with ca acetate --> continue for now. Will recheck a phos on wed.  6. Porcine Allergy 7. Anemia - TSat low.Started IV Fe load (rec'd 1st dose 1/7) . Darbe 100/wk started with HD 1/7.  8. CHF - markedly improved with volume removal.  9. COPD - compensated 10. DM - per pt controlled w/ A1C of 5.5 and he was very  upset about his diet. 11. 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 Gout on febuxostat 13. S/p R BVT 09/27/2015 +B/T 14. Hx/o urethral stricture - has historically been issue w/his voiding problem. May need to ask Urology to see (they have seen him in the past)  15. FEVER AND LEUKOCYTOSIS 10/15/14. HAD ENTEROCOCCAL ENDOCARDITIS AND OSTEOMYELITIS THIS PAST YEAR. HAS AORTIC VALVE INSUFF. PANCULTURED, STARTED EMPIRIC VANCO AND ZOSYN PER PHARMACY, PULLED FEM LINE AFTER HD (NOTE HE DOES NOT HAVE A PCN ALLERGY) Remains afebrile, cultures negative, may have been related to fem cath. Stop ATB's.  16. MRSA PCR +       Subjective:   Got TDC 1/7 by IR. Has remained afebrile since temp fem cath removed Blood cultures negative from 1/4 (empirically covered with vanco and zosyn but now stopped)  Feels well and c/o the food; wants to be on a regular diet. Denies cough, diarrhea, or obstructive sxs. foley d/c on 1/10.   Objective:   BP 140/52 mmHg  Pulse 78  Temp(Src) 97.8 F (36.6 C) (Oral)  Resp 20  Ht 6' (1.829 m)  Wt 130.5 kg (287 lb 11.2 oz)  BMI 39.01 kg/m2  SpO2 97%  Intake/Output Summary (Last 24 hours) at 10/23/15 1009 Last data filed at 10/23/15 0400  Gross per 24 hour  Intake 630.41 ml  Output   1900 ml  Net -1269.59 ml   Weight change:   Physical Exam:  Pleasant, depressed about being back on HD and unhappy about his diet CV: S1S2 No S3 No rub Lungs anteriorly clear ABD: Non-tender R renal allograft. + BS. EXT:LLE 1+ edema (side of DVT) none on the right Old aneurysmal AVF (nonfx) left. Arm with fair amount of bruising RUE BVT AVF +B/T (09/27/15) (BEAUTIFUL FISTULA) R IJ TDC (1/7)   Imaging: No results found.  Labs: BMET  Recent Labs Lab 10/17/15 0645 10/18/15 0853 10/19/15 0620 10/20/15 0630 10/21/15 0849 10/22/15 0450 10/23/15 0537  NA 136 135 137 138 138 137 137  K 4.5 4.9 5.1 4.2 4.5 4.5 4.3  CL 100* 101 99* 97*  98* 99* 98*  CO2 27 22 25 30 28 28 29   GLUCOSE 276* 375* 306* 278* 218* 245* 227*  BUN 46* 63* 70* 34* 45* 53* 32*  CREATININE 4.41* 6.34* 8.00* 5.72* 7.94* 9.46* 7.33*  CALCIUM 7.7* 7.5* 7.5* 7.2* 7.7* 7.2* 7.5*  PHOS 5.7* 6.6* 8.2* 5.7* 7.9* 7.8* 6.0*   CBC  Recent Labs Lab 10/20/15 0630 10/21/15 0849 10/22/15 0450 10/23/15 0527  WBC 3.7* 4.2 4.4 5.4  HGB 8.7* 9.3* 9.5* 8.9*  HCT 28.6* 32.0* 32.6* 30.8*  MCV 91.4 93.6 93.9 92.8  PLT 197 208 237 243    Medications:    . aspirin EC  81 mg Oral Daily  . calcium acetate  2,001 mg Oral TID WC  . cloNIDine  0.3 mg Transdermal Weekly  . darbepoetin (ARANESP) injection - DIALYSIS  100 mcg Intravenous Q Sat-HD  . doxercalciferol  2 mcg Intravenous Q T,Th,Sa-HD  . febuxostat  80 mg Oral Daily  . ferric gluconate (FERRLECIT/NULECIT) IV  125 mg Intravenous Q T,Th,Sa-HD  . insulin aspart  0-20 Units Subcutaneous TID WC  . insulin aspart  0-5 Units Subcutaneous QHS  . insulin aspart  4 Units Subcutaneous TID WC  . insulin glargine  25 Units Subcutaneous QHS  . labetalol  100 mg Oral TID  . pantoprazole  40 mg Oral Daily  . polyethylene glycol  17 g Oral BID  . predniSONE  5 mg Oral Q breakfast  . pregabalin  100 mg Oral QHS  . rosuvastatin  20 mg Oral QHS  . tacrolimus  1 mg Oral BID  . Warfarin - Pharmacist Dosing Inpatient   Does not apply q1800      Otelia Santee, MD 10/23/2015, 10:09 AM

## 2015-10-23 NOTE — Progress Notes (Signed)
10/23/2015 9:39 AM Hemodialysis Outpatient Note; this patient has been accepted at the [facility] center on a Tuesday,Thursday and Saturday 1st shift schedule. If the patient needs to begin tomorrow, Thursday, he will need to visit the center today to sign paperwork and consents, otherwise he can begin on Saturday but will still need to make arrangements to visit the center prior to Saturday to sign paperwork/consents. His scheduled chair time will be 6:40AM.Thank you. Gordy Savers

## 2015-10-23 NOTE — Progress Notes (Signed)
ANTICOAGULATION CONSULT NOTE - Follow Up  Pharmacy Consult for Argatroban and warfarin Indication: DVT  Patient Measurements: Height: 6' (182.9 cm) Weight: 287 lb 11.2 oz (130.5 kg) IBW/kg (Calculated) : 77.6  Vital Signs: Temp: 97.8 F (36.6 C) (01/11 0400) Temp Source: Oral (01/11 0400) BP: 140/52 mmHg (01/11 0400) Pulse Rate: 78 (01/11 0400)  Labs:  Recent Labs  10/21/15 0849 10/22/15 0450 10/23/15 0527 10/23/15 0537  HGB 9.3* 9.5* 8.9*  --   HCT 32.0* 32.6* 30.8*  --   PLT 208 237 243  --   APTT 58* 58* 67*  --   LABPROT 20.8* 22.4* 22.8*  --   INR 1.80* 1.98* 2.03*  --   CREATININE 7.94* 9.46*  --  7.33*    Assessment: 50 yo M admitted 12/26 and found to have DVT left popliteal vein. VQ negative for PE.  Argatroban was started on 12/26  due to porcine allergy and patient was initiated on Coumadin.  However, Argatroban was held d/t concern for head bleed and warfarin was held for placement of tunneled HD catheter. Now both are resumed. APTT remains therapeutic on 0.24mcg/kg/min.  INR 2.03 this morning.  Will need to continue overlap until INR ~4 due to interaction with Argatroban and INR.  Then will check INR off Argatroban to see if further overlap needed.  Goal of Therapy:  INR 2-3 (without influence of Argatroban) aPTT 50-90 seconds Monitor platelets by anticoagulation protocol: Yes    Plan:  -continue Argatroban at 1.6mL/hr (0.70mcg/kg/min) -warfarin 7.5mg  po x1 tonight -daily aPTT, CBC and INR  [employer], Pharm.D., BCPS Clinical Pharmacist Pager [phone] 10/23/2015 1:04 PM

## 2015-10-23 NOTE — Progress Notes (Signed)
PT Cancellation Note  Patient Details Name: [name] [name] MRN: [id number] DOB: March 13, 1966   Cancelled Treatment:    Reason Eval/Treat Not Completed: Patient declined, no reason specified.  Despite education on potential consequences of delaying/refusing mobility, pt continues to decline therapy and says, "I'm going to begin shouting if you don't leave."  Joslyn Hy PT, DPT [phone] Pager: [phone] 10/23/2015, 4:17 PM

## 2015-10-23 NOTE — Progress Notes (Signed)
PATIENT DETAILS Name: [name] [name] Age: 50 y.o. Sex: male Date of Birth: 1966-04-06 Admit Date: 10/07/2015 Admitting Physician Waldemar Dickens, MD EBR:[id number] Becky Augusta, MD  Subjective: No complaints-sleeping comfortably  Assessment/Plan: Principal Problem: Acute on chronic kidney disease stage IV: History of deceased donor kidney transplantation in 2009, on chronic immunosuppressive. Prior to this admission patient had worsening renal function and underwent a biopsy in August 2016 that demonstrated acute cellular rejection. Unfortunately has progressed to ESRD.Renal following.  Active Problems: Deep vein thrombosis (DVT) of left lower extremity: continue Argatoban and Coumadin overlap till INR therapeutic.  Acute hypoxic/hypercapnic respiratory failure:secondary to pulmonary edema/scheduled Valium use. Briefly required ICU stay. Now much improved, on nasal cannula. Titrate off oxygen.   Acute encephalopathy: Secondary to respiratory failure: Resolved.  Fever: Afebrile since femoral hemodialysis catheter was removed, repeat blood cultures and 1/5 negative. All antibiotics have been discontinued on 1/7  Anemia: Secondary to chronic disease-hemoglobin remained stable. Follow  Urinary retention: Foley catheter removed on 1/10-voiding-follow. Has history of urethral stricture-may need urology follow-up as outpatient  History of renal transplantation 2009: Unfortunately now with ESRD. Tacrolimus reduced to 1 mg twice a day, continue prednisone 5 mg a day.   Hypertension: Controlled with volume removal during dialysis and with current antihypertensives.continue transdermal clonidine and labetalol.   Dyslipidemia: Continue statin   Diabetes mellitus, type 2: CBGs relatively well-controlled-continue Lantus 25 units daily, NovoLog 4 units 3 times a day and SSI. Follow and adjust accordingly  History of COPD: Clear lungs, stable. 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 Stable for outpatient follow-up  Gout:no flare-continue on febuxostat  Disposition: Remain inpatient  Antimicrobial agents  See below  Anti-infectives    Start     Dose/Rate Route Frequency Ordered Stop   10/19/15 1220  ceFAZolin (ANCEF) 2-3 GM-% IVPB SOLR    Comments:  Ofori, Evelyn   : cabinet override      10/19/15 1220 10/19/15 1240   10/19/15 1200  vancomycin (VANCOCIN) 1,250 mg in sodium chloride 0.9 % 250 mL IVPB     1,250 mg 250 mL/hr over 60 Minutes Intravenous Every T-Th-Sa (Hemodialysis) 10/19/15 1113 10/19/15 2011   10/16/15 1400  vancomycin (VANCOCIN) 2,500 mg in sodium chloride 0.9 % 500 mL IVPB     2,500 mg 250 mL/hr over 120 Minutes Intravenous  Once 10/16/15 1256 10/16/15 1740   10/16/15 1300  piperacillin-tazobactam (ZOSYN) IVPB 2.25 g  Status:  Discontinued     2.25 g 100 mL/hr over 30 Minutes Intravenous 3 times per day 10/16/15 1256 10/20/15 1004   10/09/15 1000  sulfamethoxazole-trimethoprim (BACTRIM,SEPTRA) 400-80 MG per tablet 1 tablet  Status:  Discontinued     1 tablet Oral Once per day on Mon Wed Fri 10/07/15 1729 10/10/15 0933   10/09/15 1000  fluconazole (DIFLUCAN) tablet 50 mg  Status:  Discontinued     50 mg Oral Every M-W-F 10/07/15 1729 10/13/15 0848   10/09/15 1000  valGANciclovir (VALCYTE) 450 MG tablet TABS 450 mg  Status:  Discontinued     450 mg Oral Once per day on Mon Wed Fri 10/07/15 1729 10/13/15 0848      DVT Prophylaxis: Argatoban/Coumadin  Code Status: Full code   Family Communication None at bedside  Procedures: None  CONSULTS:  pulmonary/intensive care and nephrology  Time spent  0 minutes-Greater than 50% of this time was spent in counseling, explanation of diagnosis, planning of further management, and coordination of care.  MEDICATIONS: Scheduled Meds: . aspirin EC  81 mg Oral  Daily  . calcium acetate  2,001 mg Oral TID WC  . cloNIDine  0.3 mg Transdermal Weekly  . darbepoetin (ARANESP) injection - DIALYSIS  100 mcg Intravenous Q Sat-HD  . doxercalciferol  2 mcg Intravenous Q T,Th,Sa-HD  . febuxostat  80 mg Oral Daily  . ferric gluconate (FERRLECIT/NULECIT) IV  125 mg Intravenous Q T,Th,Sa-HD  . insulin aspart  0-20 Units Subcutaneous TID WC  . insulin aspart  0-5 Units Subcutaneous QHS  . insulin aspart  4 Units Subcutaneous TID WC  . insulin glargine  25 Units Subcutaneous QHS  . labetalol  100 mg Oral TID  . pantoprazole  40 mg Oral Daily  . polyethylene glycol  17 g Oral BID  . predniSONE  5 mg Oral Q breakfast  . pregabalin  100 mg Oral QHS  . rosuvastatin  20 mg Oral QHS  . tacrolimus  1 mg Oral BID  . Warfarin - Pharmacist Dosing Inpatient   Does not apply q1800   Continuous Infusions: . argatroban 0.22 mcg/kg/min (10/23/15 0942)   PRN Meds:.acetaminophen **OR** acetaminophen, bisacodyl, hydrALAZINE, HYDROcodone-acetaminophen, labetalol, ondansetron **OR** ondansetron (ZOFRAN) IV, promethazine, technetium TC 41M diethylenetriame-pentaacetic acid    PHYSICAL EXAM: Vital signs in last 24 hours: Filed Vitals:   10/22/15 1733 10/22/15 2220 10/22/15 2353 10/23/15 0400  BP: 99/55 116/40 174/59 140/52  Pulse:  87 84 78  Temp:  98.3 F (36.8 C)  97.8 F (36.6 C)  TempSrc:  Oral  Oral  Resp:  20    Height:      Weight:      SpO2:  95% 98% 97%    Weight change:  Filed Weights   10/19/15 1600 10/19/15 2015 10/22/15 0705  Weight: 131 kg (288 lb 12.8 oz) 129.1 kg (284 lb 9.8 oz) 130.5 kg (287 lb 11.2 oz)   Body mass index is 39.01 kg/(m^2).   Gen Exam: Awake and alert with clear speech.   Neck: Supple, No JVD.   Chest: B/L Clear.   CVS: S1 S2 Regular  Abdomen: soft, BS +, non tender, non distended.  Extremities: + edema, lower extremities warm to touch. Neurologic: Non Focal.   Skin: No Rash.   Wounds: N/A.    Intake/Output from  previous day:  Intake/Output Summary (Last 24 hours) at 10/23/15 1151 Last data filed at 10/23/15 1133  Gross per 24 hour  Intake 990.41 ml  Output      0 ml  Net 990.41 ml     LAB RESULTS: CBC  Recent Labs Lab 10/19/15 0620 10/20/15 0630 10/21/15 0849 10/22/15 0450 10/23/15 0527  WBC 3.0* 3.7* 4.2 4.4 5.4  HGB 8.5* 8.7* 9.3* 9.5* 8.9*  HCT 28.5* 28.6* 32.0* 32.6* 30.8*  PLT 157 197 208 237 243  MCV 92.8 91.4 93.6 93.9 92.8  MCH 27.7 27.8 27.2 27.4 26.8  MCHC 29.8* 30.4 29.1* 29.1* 28.9*  RDW 14.8 14.7 14.8 14.9 14.8    Chemistries   Recent Labs Lab 10/19/15 0620 10/20/15 0630 10/21/15 0849 10/22/15 0450 10/23/15 0537  NA 137 138 138 137 137  K 5.1 4.2 4.5 4.5 4.3  CL 99* 97* 98* 99* 98*  CO2 25 30 28 28 29   GLUCOSE 306* 278* 218* 245* 227*  BUN 70* 34* 45* 53* 32*  CREATININE 8.00* 5.72*  7.94* 9.46* 7.33*  CALCIUM 7.5* 7.2* 7.7* 7.2* 7.5*    CBG:  Recent Labs Lab 10/21/15 2151 10/22/15 1156 10/22/15 1700 10/22/15 2215 10/23/15 0806  GLUCAP 147* 231* 264* 197* 202*    GFR Estimated Creatinine Clearance: 16.7 mL/min (by C-G formula based on Cr of 7.33).  Coagulation profile  Recent Labs Lab 10/19/15 0818 10/20/15 0630 10/21/15 0849 10/22/15 0450 10/23/15 0527  INR 1.69* 1.89* 1.80* 1.98* 2.03*    Cardiac Enzymes No results for input(s): CKMB, TROPONINI, MYOGLOBIN in the last 168 hours.  Invalid input(s): CK  Invalid input(s): POCBNP No results for input(s): DDIMER in the last 72 hours. No results for input(s): HGBA1C in the last 72 hours. No results for input(s): CHOL, HDL, LDLCALC, TRIG, CHOLHDL, LDLDIRECT in the last 72 hours. No results for input(s): TSH, T4TOTAL, T3FREE, THYROIDAB in the last 72 hours.  Invalid input(s): FREET3 No results for input(s): VITAMINB12, FOLATE, FERRITIN, TIBC, IRON, RETICCTPCT in the last 72 hours. No results for input(s): LIPASE, AMYLASE in the last 72 hours.  Urine Studies No results for  input(s): UHGB, CRYS in the last 72 hours.  Invalid input(s): UACOL, UAPR, USPG, UPH, UTP, UGL, UKET, UBIL, UNIT, UROB, ULEU, UEPI, UWBC, URBC, UBAC, CAST, UCOM, BILUA  MICROBIOLOGY: Recent Results (from the past 240 hour(s))  Culture, blood (Routine X 2) w Reflex to ID Panel     Status: None   Collection Time: 10/16/15  2:15 PM  Result Value Ref Range Status   Specimen Description BLOOD HEMODIALYSIS CATHETER  Final   Special Requests BOTTLES DRAWN AEROBIC AND ANAEROBIC 10CC  Final   Culture NO GROWTH 5 DAYS  Final   Report Status 10/21/2015 FINAL  Final  Culture, blood (Routine X 2) w Reflex to ID Panel     Status: None   Collection Time: 10/16/15  2:30 PM  Result Value Ref Range Status   Specimen Description BLOOD HEMODIALYSIS CATHETER  Final   Special Requests BOTTLES DRAWN AEROBIC AND ANAEROBIC 10CC  Final   Culture NO GROWTH 5 DAYS  Final   Report Status 10/21/2015 FINAL  Final    RADIOLOGY STUDIES/RESULTS: Dg Chest 2 View  10/17/2015  CLINICAL DATA:  Fever and weakness. EXAM: CHEST  2 VIEW COMPARISON:  10/15/2015, 06/04/2011 and 12/12/2007 FINDINGS: Patient slightly rotated to the left. Lungs are adequately inflated demonstrate no focal consolidation or effusion. There is prominence of the perihilar markings suggesting mild vascular congestion. There is mild stable cardiomegaly. Degenerative changes of the spine. Remainder the exam is unchanged. IMPRESSION: Mild stable cardiomegaly with evidence of mild vascular congestion. Electronically Signed   By: Marin Olp M.D.   On: 10/17/2015 09:04   Dg Chest 2 View  10/07/2015  CLINICAL DATA:  Pt reports shortness of breath progressively worse x 3 days, worse at night. Pt had surgery on 12/16 for dialysis fistula; h/o HTN and DM; non-smoker EXAM: CHEST - 2 VIEW COMPARISON:  01/15/2014 FINDINGS: Moderate cardiomegaly. New central pulmonary vascular congestion. Bilateral interstitial edema or infiltrates, new since prior study. Small  bilateral pleural effusions, new since previous exam. No pneumothorax. Visualized skeletal structures are unremarkable. IMPRESSION: 1. Cardiomegaly with new small effusions, bilateral interstitial edema and vascular congestion suggesting CHF. Electronically Signed   By: Lucrezia Europe M.D.   On: 10/07/2015 12:10   Ct Head Wo Contrast  10/13/2015  CLINICAL DATA:  Diminished eye site, hearing difficulty, anemia. Gait disorder. PTSD. EXAM: CT HEAD WITHOUT CONTRAST TECHNIQUE: Contiguous axial images were obtained from the  base of the skull through the vertex without intravenous contrast. COMPARISON:  None. FINDINGS: There is no intra or extra-axial fluid collection or mass lesion. The basilar cisterns and ventricles have a normal appearance. There is no CT evidence for acute infarction or hemorrhage. Bone windows show no acute abnormality. There is atherosclerotic calcification of the internal carotid arteries. IMPRESSION: No evidence for acute intracranial abnormality. Electronically Signed   By: Nolon Nations M.D.   On: 10/13/2015 10:58   US Renal Transplant W/doppler  10/14/2015  CLINICAL DATA:  Progressive chronic kidney disease requiring hemodialysis. Status post renal transplant in 2009. EXAM: ULTRASOUND OF RENAL TRANSPLANT WITH DOPPLER TECHNIQUE: Ultrasound examination of the renal transplant was performed with gray-scale, color and duplex doppler evaluation. COMPARISON:  None. FINDINGS: Transplant kidney location:  Right lower quadrant. Transplant kidney: Length: 11.5 cm. Morphology of the renal transplant appears abnormal with somewhat poor delineation of contours and increase in cortical echogenicity. Part of the difficulty in delineate contours is felt to be due to some adjacent bowel gas. There may be mild prominence of the collecting system in one pole without overt hydronephrosis. Color flow in the main renal artery:  Present Color flow in the main renal vein:  Present Duplex Doppler Evaluation  Resistive index in main renal artery: 1.0. Waveform appears to be of high resistance which may be reflective of transplant rejection. Venous waveform in main renal vein:  Present Resistive index in upper pole intrarenal artery:  0.9 (normal 0.6-0.8; equivocal 0.8-0.9; abnormal > 0.9) Resistive index in lower pole intrarenal artery: 0.8 (normal 0.6-0.8; equivocal 0.8-0.9; abnormal > 0.9) Bladder: Decompressed by Foley catheter. Other findings: No peri-transplant fluid collections identified by ultrasound. IMPRESSION: Abnormal sonographic appearance of the right lower quadrant renal transplant with poor delineation of contours and increased cortical echogenicity. Mild prominence of the collecting system in one pole without overt hydronephrosis. Arterial resistive indices are elevated or borderline with waveforms consistent with high resistance pattern. These findings are suggestive of transplant rejection. Electronically Signed   By: Aletta Edouard M.D.   On: 10/14/2015 09:37   Nm Pulmonary Perf And Vent  10/08/2015  CLINICAL DATA:  50 year old male with shortness of breath and known DVT. EXAM: NUCLEAR MEDICINE VENTILATION - PERFUSION LUNG SCAN TECHNIQUE: Ventilation images were obtained in multiple projections using inhaled aerosol Tc-33mDTPA. Perfusion images were obtained in multiple projections after intravenous injection of Tc-916mAA. RADIOPHARMACEUTICALS:  3163.8illicuries [id number] aerosol inhalation and 4.1 millicurie [id number] IV COMPARISON:  10/07/2015 chest radiograph FINDINGS: Ventilation: No focal ventilation defect. Perfusion: No wedge shaped peripheral perfusion defects to suggest acute pulmonary embolism. IMPRESSION: Unremarkable exam.  No evidence of pulmonary emboli. Electronically Signed   By: JeMargarette Canada.D.   On: 10/08/2015 15:00   Ir Fluoro Guide Cv Line Right  10/19/2015  INDICATION: 89ear old male with chronic kidney disease requiring hemodialysis. He recently had  line sepsis and has been on a several day line holiday. He has a newly placed right upper extremity arteriovenous fistula which is still in the process of maturing. He needs durable dialysis access until his AVF is fully matured and accessible. EXAM: TUNNELED CENTRAL VENOUS HEMODIALYSIS CATHETER PLACEMENT WITH ULTRASOUND AND FLUOROSCOPIC GUIDANCE MEDICATIONS: Ancef 2 gm IV; The IV antibiotic was given in an appropriate time interval prior to skin puncture. CONTRAST:  None ANESTHESIA/SEDATION: Versed 1.5 mg IV; Fentanyl 75 mcg IV Total Moderate Sedation Time 16 minutes. FLUOROSCOPY TIME:  36 seconds for a total of 8 mGy COMPLICATIONS: None  immediate PROCEDURE: Informed written consent was obtained from the patient after a discussion of the risks, benefits, and alternatives to treatment. Questions regarding the procedure were encouraged and answered. The right neck and chest were prepped with chlorhexidine in a sterile fashion, and a sterile drape was applied covering the operative field. Maximum barrier sterile technique with sterile gowns and gloves were used for the procedure. A timeout was performed prior to the initiation of the procedure. After creating a small venotomy incision, a micropuncture kit was utilized to access the right internal jugular vein under direct, real-time ultrasound guidance after the overlying soft tissues were anesthetized with 1% lidocaine with epinephrine. Ultrasound image documentation was performed. The microwire was kinked to measure appropriate catheter length. A stiff glidewire was advanced to the level of the IVC and the micropuncture sheath was exchanged for a peel-away sheath. A Palindrome tunneled hemodialysis catheter measuring 23 cm from tip to cuff was tunneled in a retrograde fashion from the anterior chest wall to the venotomy incision. The catheter was then placed through the peel-away sheath with tips ultimately positioned within the superior aspect of the right atrium.  Final catheter positioning was confirmed and documented with a spot radiographic image. The catheter aspirates and flushes normally. The catheter was flushed with appropriate volume heparin dwells. The catheter exit site was secured with a 0-Prolene retention suture. The venotomy incision was closed with Dermabond. Dressings were applied. The patient tolerated the procedure well without immediate post procedural complication. IMPRESSION: Successful placement of 23 cm tip to cuff tunneled hemodialysis catheter via the right internal jugular vein with tips terminating within the superior aspect of the right atrium. The catheter is ready for immediate use. Signed, Criselda Peaches, MD [facility] Radiology Electronically Signed   By: Jacqulynn Cadet M.D.   On: 10/19/2015 13:01   Ir US Guide Vasc Access Right  10/19/2015  INDICATION: 50 year old male with chronic kidney disease requiring hemodialysis. He recently had line sepsis and has been on a several day line holiday. He has a newly placed right upper extremity arteriovenous fistula which is still in the process of maturing. He needs durable dialysis access until his AVF is fully matured and accessible. EXAM: TUNNELED CENTRAL VENOUS HEMODIALYSIS CATHETER PLACEMENT WITH ULTRASOUND AND FLUOROSCOPIC GUIDANCE MEDICATIONS: Ancef 2 gm IV; The IV antibiotic was given in an appropriate time interval prior to skin puncture. CONTRAST:  None ANESTHESIA/SEDATION: Versed 1.5 mg IV; Fentanyl 75 mcg IV Total Moderate Sedation Time 16 minutes. FLUOROSCOPY TIME:  36 seconds for a total of 8 mGy COMPLICATIONS: None immediate PROCEDURE: Informed written consent was obtained from the patient after a discussion of the risks, benefits, and alternatives to treatment. Questions regarding the procedure were encouraged and answered. The right neck and chest were prepped with chlorhexidine in a sterile fashion, and a sterile drape was  applied covering the operative field. Maximum barrier sterile technique with sterile gowns and gloves were used for the procedure. A timeout was performed prior to the initiation of the procedure. After creating a small venotomy incision, a micropuncture kit was utilized to access the right internal jugular vein under direct, real-time ultrasound guidance after the overlying soft tissues were anesthetized with 1% lidocaine with epinephrine. Ultrasound image documentation was performed. The microwire was kinked to measure appropriate catheter length. A stiff glidewire was advanced to the level of the IVC and the micropuncture sheath was exchanged for a peel-away sheath. A Palindrome tunneled hemodialysis catheter measuring 23 cm  from tip to cuff was tunneled in a retrograde fashion from the anterior chest wall to the venotomy incision. The catheter was then placed through the peel-away sheath with tips ultimately positioned within the superior aspect of the right atrium. Final catheter positioning was confirmed and documented with a spot radiographic image. The catheter aspirates and flushes normally. The catheter was flushed with appropriate volume heparin dwells. The catheter exit site was secured with a 0-Prolene retention suture. The venotomy incision was closed with Dermabond. Dressings were applied. The patient tolerated the procedure well without immediate post procedural complication. IMPRESSION: Successful placement of 23 cm tip to cuff tunneled hemodialysis catheter via the right internal jugular vein with tips terminating within the superior aspect of the right atrium. The catheter is ready for immediate use. Signed, Criselda Peaches, MD [facility] Radiology Electronically Signed   By: Jacqulynn Cadet M.D.   On: 10/19/2015 13:01   Dg Chest Port 1 View  10/15/2015  CLINICAL DATA:  Pulmonary edema. EXAM: PORTABLE CHEST 1 VIEW COMPARISON:  10/14/2015.  FINDINGS: Mediastinum hilar structures stable. Cardiomegaly with pulmonary venous congestion. Interim improvement of bilateral pulmonary infiltrates/ edema. Small left pleural effusion cannot be excluded. No pneumothorax. IMPRESSION: Improving congestive heart failure. Mild persistent pulmonary edema. Small left pleural effusion cannot be excluded. Electronically Signed   By: Marcello Moores  Register   On: 10/15/2015 07:30   Dg Chest Port 1 View  10/14/2015  CLINICAL DATA:  50 year old male with history of respiratory failure. EXAM: PORTABLE CHEST 1 VIEW COMPARISON:  Chest x-ray 10/13/2015. FINDINGS: Low lung volumes. Bibasilar opacities favored to predominantly reflect atelectasis. Moderate pulmonary edema. Small bilateral pleural effusions. Mild cardiomegaly. Upper mediastinal contours are distorted by patient's positioning and rotation to the right. IMPRESSION: 1. The appearance the chest suggests congestive heart failure, as above. Electronically Signed   By: Vinnie Langton M.D.   On: 10/14/2015 07:57   Dg Chest Port 1 View  10/13/2015  CLINICAL DATA:  Pt has kidney failure, currently on BiPAP for decreased O2 levels EXAM: PORTABLE CHEST 1 VIEW COMPARISON:  10/07/2015 FINDINGS: Heart is enlarged. Significant pulmonary edema identified bilaterally. More confluent opacity is identified at the left lung base, consistent with edema or infiltrate. IMPRESSION: Pulmonary edema. Left lower lobe edema and/or infiltrate. Electronically Signed   By: Nolon Nations M.D.   On: 10/13/2015 12:55   Dg Abd Portable 1v  10/14/2015  CLINICAL DATA:  Vomiting.  Possible small bowel obstruction. EXAM: PORTABLE ABDOMEN - 1 VIEW COMPARISON:  None. FINDINGS: Two supine views. Degraded exam secondary to patient body habitus. Right femoral line. No bowel distension. Left-sided colonic stool and gas. IMPRESSION: Limited exam, demonstrating no evidence of bowel obstruction. Electronically Signed   By: Abigail Miyamoto M.D.   On: 10/14/2015  18:54    Oren Binet, MD  [facility] Pager:[phone]  If 7PM-7AM, please contact night-coverage [email or web address] Password [email or web address] 10/23/2015, 11:51 AM   LOS: 16 days

## 2015-10-23 NOTE — Progress Notes (Signed)
Inpatient Diabetes Program Recommendations  AACE/ADA: New Consensus Statement on Inpatient Glycemic Control (2015)  Target Ranges:  Prepandial:   less than 140 mg/dL      Peak postprandial:   less than 180 mg/dL (1-2 hours)      Critically ill patients:  140 - 180 mg/dL   Review of Glycemic Control:  Results for [name], [name] [name] (MRN [id number]) as of 10/23/2015 12:52  Ref. Range 10/22/2015 11:56 10/22/2015 17:00 10/22/2015 22:15 10/23/2015 08:06 10/23/2015 12:03  Glucose-Capillary Latest Ref Range: 65-99 mg/dL 231 (H) 264 (H) 197 (H) 202 (H) 300 (H)    Diabetes history: Type 2 diabetes Outpatient Diabetes medications: Toujeo 90 units bid, U500-10 units with breakfast/20 units with dinner/ 20 units dinner Current orders for Inpatient glycemic control:  Lantus 25 units q HS, Novolog 4 units tid with meals, Novolog resistant tid with meals and HS Inpatient Diabetes Program Recommendations:   Please consider increasing Lantus to 25 units bid and increase Novolog correction to 6 units tid with meals.  Thanks, Adah Perl, RN, BC-ADM Inpatient Diabetes Coordinator Pager [phone] (8a-5p)

## 2015-10-24 DIAGNOSIS — N184 Chronic kidney disease, stage 4 (severe): Secondary | ICD-10-CM

## 2015-10-24 DIAGNOSIS — I4581 Long QT syndrome: Secondary | ICD-10-CM

## 2015-10-24 DIAGNOSIS — G4733 Obstructive sleep apnea (adult) (pediatric): Secondary | ICD-10-CM

## 2015-10-24 LAB — GLUCOSE, CAPILLARY
Glucose-Capillary: 309 mg/dL — ABNORMAL HIGH (ref 65–99)
Glucose-Capillary: 174 mg/dL — ABNORMAL HIGH (ref 65–99)
Glucose-Capillary: 254 mg/dL — ABNORMAL HIGH (ref 65–99)
Glucose-Capillary: 176 mg/dL — ABNORMAL HIGH (ref 65–99)
Glucose-Capillary: 181 mg/dL — ABNORMAL HIGH (ref 65–99)

## 2015-10-24 LAB — RENAL FUNCTION PANEL
Albumin: 2.6 g/dL — ABNORMAL LOW (ref 3.5–5.0)
Albumin: 2.4 g/dL — ABNORMAL LOW (ref 3.5–5.0)
Anion gap: 10 (ref 5–15)
Anion gap: 12 (ref 5–15)
BUN: 41 mg/dL — ABNORMAL HIGH (ref 6–20)
BUN: 42 mg/dL — ABNORMAL HIGH (ref 6–20)
CO2: 29 mmol/L (ref 22–32)
CO2: 28 mmol/L (ref 22–32)
Calcium: 7.7 mg/dL — ABNORMAL LOW (ref 8.9–10.3)
Calcium: 7.8 mg/dL — ABNORMAL LOW (ref 8.9–10.3)
Chloride: 98 mmol/L — ABNORMAL LOW (ref 101–111)
Chloride: 98 mmol/L — ABNORMAL LOW (ref 101–111)
Creatinine, Ser: 9.46 mg/dL — ABNORMAL HIGH (ref 0.61–1.24)
Creatinine, Ser: 9.45 mg/dL — ABNORMAL HIGH (ref 0.61–1.24)
GFR calc Af Amer: 7 mL/min — ABNORMAL LOW (ref >60)
GFR calc Af Amer: 7 mL/min — ABNORMAL LOW (ref >60)
GFR calc non Af Amer: 6 mL/min — ABNORMAL LOW (ref >60)
GFR calc non Af Amer: 6 mL/min — ABNORMAL LOW (ref >60)
Glucose, Bld: 337 mg/dL — ABNORMAL HIGH (ref 65–99)
Glucose, Bld: 330 mg/dL — ABNORMAL HIGH (ref 65–99)
Phosphorus: 5.8 mg/dL — ABNORMAL HIGH (ref 2.5–4.6)
Phosphorus: 5.6 mg/dL — ABNORMAL HIGH (ref 2.5–4.6)
Potassium: 4.5 mmol/L (ref 3.5–5.1)
Potassium: 4.1 mmol/L (ref 3.5–5.1)
Sodium: 137 mmol/L (ref 135–145)
Sodium: 138 mmol/L (ref 135–145)

## 2015-10-24 LAB — CBC
HCT: 30.9 % — ABNORMAL LOW (ref 39.0–52.0)
HCT: 29.5 % — ABNORMAL LOW (ref 39.0–52.0)
Hemoglobin: 9.2 g/dL — ABNORMAL LOW (ref 13.0–17.0)
Hemoglobin: 8.7 g/dL — ABNORMAL LOW (ref 13.0–17.0)
MCH: 27.9 pg (ref 26.0–34.0)
MCH: 27.2 pg (ref 26.0–34.0)
MCHC: 29.8 g/dL — ABNORMAL LOW (ref 30.0–36.0)
MCHC: 29.5 g/dL — ABNORMAL LOW (ref 30.0–36.0)
MCV: 93.6 fL (ref 78.0–100.0)
MCV: 92.2 fL (ref 78.0–100.0)
Platelets: 263 10*3/uL (ref 150–400)
Platelets: 310 10*3/uL (ref 150–400)
RBC: 3.3 MIL/uL — ABNORMAL LOW (ref 4.22–5.81)
RBC: 3.2 MIL/uL — ABNORMAL LOW (ref 4.22–5.81)
RDW: 15 % (ref 11.5–15.5)
RDW: 14.9 % (ref 11.5–15.5)
WBC: 6.4 10*3/uL (ref 4.0–10.5)
WBC: 6.1 10*3/uL (ref 4.0–10.5)

## 2015-10-24 LAB — HEPATITIS B SURFACE ANTIBODY,QUALITATIVE: Hep B S Ab: NONREACTIVE

## 2015-10-24 LAB — PROTIME-INR
INR: 2.89 — ABNORMAL HIGH (ref 0.00–1.49)
Prothrombin Time: 29.8 seconds — ABNORMAL HIGH (ref 11.6–15.2)

## 2015-10-24 LAB — APTT: aPTT: 72 seconds — ABNORMAL HIGH (ref 24–37)

## 2015-10-24 LAB — HEPATITIS B CORE ANTIBODY, TOTAL: Hep B Core Total Ab: NEGATIVE

## 2015-10-24 MED ORDER — LIDOCAINE HCL (PF) 1 % IJ SOLN: 5.0000 mL | INTRAMUSCULAR | Status: DC | PRN

## 2015-10-24 MED ORDER — WARFARIN SODIUM 7.5 MG PO TABS
7.5000 mg | ORAL_TABLET | Freq: Once | ORAL | Status: AC
  Administered 2015-10-24: 7.5 mg via ORAL
  Filled 2015-10-24: qty 1

## 2015-10-24 MED ORDER — SODIUM CHLORIDE 0.9 % IV SOLN: 100.0000 mL | INTRAVENOUS | Status: DC | PRN

## 2015-10-24 MED ORDER — INSULIN GLARGINE 100 UNIT/ML ~~LOC~~ SOLN
35.0000 [IU] | Freq: Every day | SUBCUTANEOUS | Status: DC
  Administered 2015-10-25 (×2): 35 [IU] via SUBCUTANEOUS
  Filled 2015-10-24 (×4): qty 0.35

## 2015-10-24 MED ORDER — DOXERCALCIFEROL 4 MCG/2ML IV SOLN
INTRAVENOUS | Status: AC
  Administered 2015-10-24: 2 ug via INTRAVENOUS
  Filled 2015-10-24: qty 2

## 2015-10-24 MED ORDER — INSULIN ASPART 100 UNIT/ML ~~LOC~~ SOLN
6.0000 [IU] | Freq: Three times a day (TID) | SUBCUTANEOUS | Status: DC
  Administered 2015-10-24 – 2015-10-26 (×7): 6 [IU] via SUBCUTANEOUS

## 2015-10-24 MED ORDER — LIDOCAINE-PRILOCAINE 2.5-2.5 % EX CREA: 1.0000 "application " | TOPICAL_CREAM | CUTANEOUS | Status: DC | PRN

## 2015-10-24 MED ORDER — ALTEPLASE 2 MG IJ SOLR: 2.0000 mg | Freq: Once | INTRAMUSCULAR | Status: DC | PRN

## 2015-10-24 MED ORDER — PENTAFLUOROPROP-TETRAFLUOROETH EX AERO: 1.0000 "application " | INHALATION_SPRAY | CUTANEOUS | Status: DC | PRN

## 2015-10-24 NOTE — Progress Notes (Signed)
PT Cancellation Note  Patient Details Name: Dashay Soelberg MRN: [id number] DOB: 09-30-1966   Cancelled Treatment:    Reason Eval/Treat Not Completed: Patient at procedure or test/unavailable (HD)   Arnett 10/24/2015, 11:30 AM  Suanne Marker PT [phone]

## 2015-10-24 NOTE — Progress Notes (Signed)
PATIENT DETAILS Name: [name] [name] Age: 50 y.o. Sex: male Date of Birth: 1966/04/30 Admit Date: 10/07/2015 Admitting Physician Waldemar Dickens, MD MVE:[id number] Becky Augusta, MD  Subjective: No complaints-sleeping comfortably-anxious to go home  Assessment/Plan: Principal Problem: Acute on chronic kidney disease stage IV-now ESRD: History of deceased donor kidney transplantation in 2009, on chronic immunosuppressive. Prior to this admission patient had worsening renal function and underwent a biopsy in August 2016 that demonstrated acute cellular rejection. Unfortunately has progressed to ESRD.Renal following.  Active Problems: Deep vein thrombosis (DVT) of left lower extremity: continue Argatoban and Coumadin overlap till INR therapeutic.  Acute hypoxic/hypercapnic respiratory failure:secondary to pulmonary edema/scheduled Valium use. Briefly required ICU stay. Now much improved, on nasal cannula. Titrate off oxygen.   Acute encephalopathy: Secondary to respiratory failure: Resolved.  Fever: Afebrile since femoral hemodialysis catheter was removed, repeat blood cultures 1/5 negative. All antibiotics have been discontinued on 1/7  Anemia: Secondary to chronic disease-hemoglobin remained stable. Follow  Urinary retention: Foley catheter removed on 1/10-voiding-follow. Has history of urethral stricture-may need urology follow-up as outpatient  History of renal transplantation 2009: Unfortunately now with ESRD. Tacrolimus reduced to 1 mg twice a day, continue prednisone 5 mg a day.   Hypertension: Controlled with volume removal during dialysis and with current antihypertensives.Continue transdermal clonidine and labetalol.   Dyslipidemia: Continue statin   Diabetes mellitus, type 2: CBGs unccontrolled-increase Lantus to 35 units daily, increase NovoLog 6 units 3 times a day and SSI. Follow and adjust accordingly  History of COPD: Clear lungs, stable. As needed  bronchodilators   Aortic Insufficiency (hx/o Aortic IE 11/2014 Enteroccoccus Faecalis): Blood cultures 1/4 negative, last echo July 2016 showed mild-to-moderate AI. Stable for outpatient follow-up  Gout:no flare-continue on febuxostat  Disposition: Remain inpatient-needs INR to be close to 4 prior to d/c  Antimicrobial agents  See below  Anti-infectives    Start     Dose/Rate Route Frequency Ordered Stop   10/19/15 1220  ceFAZolin (ANCEF) 2-3 GM-% IVPB SOLR    Comments:  Ofori, Evelyn   : cabinet override      10/19/15 1220 10/19/15 1240   10/19/15 1200  vancomycin (VANCOCIN) 1,250 mg in sodium chloride 0.9 % 250 mL IVPB     1,250 mg 250 mL/hr over 60 Minutes Intravenous Every T-Th-Sa (Hemodialysis) 10/19/15 1113 10/19/15 2011   10/16/15 1400  vancomycin (VANCOCIN) 2,500 mg in sodium chloride 0.9 % 500 mL IVPB     2,500 mg 250 mL/hr over 120 Minutes Intravenous  Once 10/16/15 1256 10/16/15 1740   10/16/15 1300  piperacillin-tazobactam (ZOSYN) IVPB 2.25 g  Status:  Discontinued     2.25 g 100 mL/hr over 30 Minutes Intravenous 3 times per day 10/16/15 1256 10/20/15 1004   10/09/15 1000  sulfamethoxazole-trimethoprim (BACTRIM,SEPTRA) 400-80 MG per tablet 1 tablet  Status:  Discontinued     1 tablet Oral Once per day on Mon Wed Fri 10/07/15 1729 10/10/15 0933   10/09/15 1000  fluconazole (DIFLUCAN) tablet 50 mg  Status:  Discontinued     50 mg Oral Every M-W-F 10/07/15 1729 10/13/15 0848   10/09/15 1000  valGANciclovir (VALCYTE) 450 MG tablet TABS 450 mg  Status:  Discontinued     450 mg Oral Once per day on Mon Wed Fri 10/07/15 1729 10/13/15 0848      DVT Prophylaxis: Argatoban/Coumadin  Code Status: Full code   Family Communication None at bedside  Procedures: None  CONSULTS:  pulmonary/intensive care and nephrology  Time spent 30 minutes-Greater than 50% of this time was spent in counseling, explanation of diagnosis, planning of further management, and coordination  of care.  MEDICATIONS: Scheduled Meds: . aspirin EC  81 mg Oral Daily  . calcium acetate  2,001 mg Oral TID WC  . cloNIDine  0.3 mg Transdermal Weekly  . darbepoetin (ARANESP) injection - DIALYSIS  100 mcg Intravenous Q Sat-HD  . doxercalciferol      . doxercalciferol  2 mcg Intravenous Q T,Th,Sa-HD  . febuxostat  80 mg Oral Daily  . ferric gluconate (FERRLECIT/NULECIT) IV  125 mg Intravenous Q T,Th,Sa-HD  . insulin aspart  0-20 Units Subcutaneous TID WC  . insulin aspart  0-5 Units Subcutaneous QHS  . insulin aspart  4 Units Subcutaneous TID WC  . insulin glargine  25 Units Subcutaneous QHS  . labetalol  100 mg Oral TID  . pantoprazole  40 mg Oral Daily  . polyethylene glycol  17 g Oral BID  . predniSONE  5 mg Oral Q breakfast  . pregabalin  100 mg Oral QHS  . rosuvastatin  20 mg Oral QHS  . tacrolimus  1 mg Oral BID  . Warfarin - Pharmacist Dosing Inpatient   Does not apply q1800   Continuous Infusions: . argatroban 0.22 mcg/kg/min (10/24/15 0804)   PRN Meds:.sodium chloride, sodium chloride, acetaminophen **OR** acetaminophen, albuterol, alteplase, bisacodyl, hydrALAZINE, HYDROcodone-acetaminophen, labetalol, lidocaine (PF), lidocaine-prilocaine, ondansetron **OR** ondansetron (ZOFRAN) IV, pentafluoroprop-tetrafluoroeth, promethazine, technetium TC 11M diethylenetriame-pentaacetic acid    PHYSICAL EXAM: Vital signs in last 24 hours: Filed Vitals:   10/24/15 0930 10/24/15 1000 10/24/15 1030 10/24/15 1100  BP: 138/48 146/57 134/51 131/54  Pulse: 82 78 94 80  Temp:      TempSrc:      Resp: _0 Height:      Weight:      SpO2:        Weight change:  Filed Weights   10/19/15 2015 10/22/15 0705 10/24/15 0904  Weight: 129.1 kg (284 lb 9.8 oz) 130.5 kg (287 lb 11.2 oz) 131.4 kg (289 lb 11 oz)   Body mass index is 39.28 kg/(m^2).   Gen Exam: Awake and alert with clear speech.   Neck: Supple, No JVD.   Chest: B/L Clear.   CVS: S1 S2 Regular  Abdomen: soft, BS  +, non tender, non distended.  Extremities: + edema, lower extremities warm to touch. Neurologic: Non Focal.   Skin: No Rash.   Wounds: N/A.    Intake/Output from previous day:  Intake/Output Summary (Last 24 hours) at 10/24/15 1128 Last data filed at 10/24/15 0556  Gross per 24 hour  Intake    582 ml  Output      0 ml  Net    582 ml     LAB RESULTS: CBC  Recent Labs Lab 10/21/15 0849 10/22/15 0450 10/23/15 0527 10/24/15 0604 10/24/15 0935  WBC 4.2 4.4 5.4 6.4 6.1  HGB 9.3* 9.5* 8.9* 9.2* 8.7*  HCT 32.0* 32.6* 30.8* 30.9* 29.5*  PLT 208 237 243 263 310  MCV 93.6 93.9 92.8 93.6 92.2  MCH 27.2 27.4 26.8 27.9 27.2  MCHC 29.1* 29.1* 28.9* 29.8* 29.5*  RDW 14.8 14.9 14.8 15.0 14.9    Chemistries   Recent Labs Lab 10/21/15 0849 10/22/15 0450 10/23/15 0537 10/24/15 0604 10/24/15 0935  NA 138 137 137 137 138  K 4.5 4.5 4.3 4.5 4.1  CL 98*  99* 98* 98* 98*  CO2 _0 GLUCOSE 218* 245* 227* 337* 330*  BUN 45* 53* 32* 41* 42*  CREATININE 7.94* 9.46* 7.33* 9.46* 9.45*  CALCIUM 7.7* 7.2* 7.5* 7.7* 7.8*    CBG:  Recent Labs Lab 10/23/15 0806 10/23/15 1203 10/23/15 1642 10/23/15 2213 10/24/15 0731  GLUCAP 202* 300* 284* 284* 309*    GFR Estimated Creatinine Clearance: 13 mL/min (by C-G formula based on Cr of 9.45).  Coagulation profile  Recent Labs Lab 10/20/15 0630 10/21/15 0849 10/22/15 0450 10/23/15 0527 10/24/15 0604  INR 1.89* 1.80* 1.98* 2.03* 2.89*    Cardiac Enzymes No results for input(s): CKMB, TROPONINI, MYOGLOBIN in the last 168 hours.  Invalid input(s): CK  Invalid input(s): POCBNP No results for input(s): DDIMER in the last 72 hours. No results for input(s): HGBA1C in the last 72 hours. No results for input(s): CHOL, HDL, LDLCALC, TRIG, CHOLHDL, LDLDIRECT in the last 72 hours. No results for input(s): TSH, T4TOTAL, T3FREE, THYROIDAB in the last 72 hours.  Invalid input(s): FREET3 No results for input(s):  VITAMINB12, FOLATE, FERRITIN, TIBC, IRON, RETICCTPCT in the last 72 hours. No results for input(s): LIPASE, AMYLASE in the last 72 hours.  Urine Studies No results for input(s): UHGB, CRYS in the last 72 hours.  Invalid input(s): UACOL, UAPR, USPG, UPH, UTP, UGL, UKET, UBIL, UNIT, UROB, ULEU, UEPI, UWBC, URBC, UBAC, CAST, UCOM, BILUA  MICROBIOLOGY: Recent Results (from the past 240 hour(s))  Culture, blood (Routine X 2) w Reflex to ID Panel     Status: None   Collection Time: 10/16/15  2:15 PM  Result Value Ref Range Status   Specimen Description BLOOD HEMODIALYSIS CATHETER  Final   Special Requests BOTTLES DRAWN AEROBIC AND ANAEROBIC 10CC  Final   Culture NO GROWTH 5 DAYS  Final   Report Status 10/21/2015 FINAL  Final  Culture, blood (Routine X 2) w Reflex to ID Panel     Status: None   Collection Time: 10/16/15  2:30 PM  Result Value Ref Range Status   Specimen Description BLOOD HEMODIALYSIS CATHETER  Final   Special Requests BOTTLES DRAWN AEROBIC AND ANAEROBIC 10CC  Final   Culture NO GROWTH 5 DAYS  Final   Report Status 10/21/2015 FINAL  Final    RADIOLOGY STUDIES/RESULTS: Dg Chest 2 View  10/17/2015  CLINICAL DATA:  Fever and weakness. EXAM: CHEST  2 VIEW COMPARISON:  10/15/2015, 06/04/2011 and 12/12/2007 FINDINGS: Patient slightly rotated to the left. Lungs are adequately inflated demonstrate no focal consolidation or effusion. There is prominence of the perihilar markings suggesting mild vascular congestion. There is mild stable cardiomegaly. Degenerative changes of the spine. Remainder the exam is unchanged. IMPRESSION: Mild stable cardiomegaly with evidence of mild vascular congestion. Electronically Signed   By: Marin Olp M.D.   On: 10/17/2015 09:04   Dg Chest 2 View  10/07/2015  CLINICAL DATA:  Pt reports shortness of breath progressively worse x 3 days, worse at night. Pt had surgery on 12/16 for dialysis fistula; h/o HTN and DM; non-smoker EXAM: CHEST - 2 VIEW  COMPARISON:  01/15/2014 FINDINGS: Moderate cardiomegaly. New central pulmonary vascular congestion. Bilateral interstitial edema or infiltrates, new since prior study. Small bilateral pleural effusions, new since previous exam. No pneumothorax. Visualized skeletal structures are unremarkable. IMPRESSION: 1. Cardiomegaly with new small effusions, bilateral interstitial edema and vascular congestion suggesting CHF. Electronically Signed   By: Lucrezia Europe M.D.   On: 10/07/2015 12:10   Ct Head Wo  Contrast  10/13/2015  CLINICAL DATA:  Diminished eye site, hearing difficulty, anemia. Gait disorder. PTSD. EXAM: CT HEAD WITHOUT CONTRAST TECHNIQUE: Contiguous axial images were obtained from the base of the skull through the vertex without intravenous contrast. COMPARISON:  None. FINDINGS: There is no intra or extra-axial fluid collection or mass lesion. The basilar cisterns and ventricles have a normal appearance. There is no CT evidence for acute infarction or hemorrhage. Bone windows show no acute abnormality. There is atherosclerotic calcification of the internal carotid arteries. IMPRESSION: No evidence for acute intracranial abnormality. Electronically Signed   By: Nolon Nations M.D.   On: 10/13/2015 10:58   US Renal Transplant W/doppler  10/14/2015  CLINICAL DATA:  Progressive chronic kidney disease requiring hemodialysis. Status post renal transplant in 2009. EXAM: ULTRASOUND OF RENAL TRANSPLANT WITH DOPPLER TECHNIQUE: Ultrasound examination of the renal transplant was performed with gray-scale, color and duplex doppler evaluation. COMPARISON:  None. FINDINGS: Transplant kidney location:  Right lower quadrant. Transplant kidney: Length: 11.5 cm. Morphology of the renal transplant appears abnormal with somewhat poor delineation of contours and increase in cortical echogenicity. Part of the difficulty in delineate contours is felt to be due to some adjacent bowel gas. There may be mild prominence of the collecting  system in one pole without overt hydronephrosis. Color flow in the main renal artery:  Present Color flow in the main renal vein:  Present Duplex Doppler Evaluation Resistive index in main renal artery: 1.0. Waveform appears to be of high resistance which may be reflective of transplant rejection. Venous waveform in main renal vein:  Present Resistive index in upper pole intrarenal artery:  0.9 (normal 0.6-0.8; equivocal 0.8-0.9; abnormal > 0.9) Resistive index in lower pole intrarenal artery: 0.8 (normal 0.6-0.8; equivocal 0.8-0.9; abnormal > 0.9) Bladder: Decompressed by Foley catheter. Other findings: No peri-transplant fluid collections identified by ultrasound. IMPRESSION: Abnormal sonographic appearance of the right lower quadrant renal transplant with poor delineation of contours and increased cortical echogenicity. Mild prominence of the collecting system in one pole without overt hydronephrosis. Arterial resistive indices are elevated or borderline with waveforms consistent with high resistance pattern. These findings are suggestive of transplant rejection. Electronically Signed   By: Aletta Edouard M.D.   On: 10/14/2015 09:37   Nm Pulmonary Perf And Vent  10/08/2015  CLINICAL DATA:  50 year old male with shortness of breath and known DVT. EXAM: NUCLEAR MEDICINE VENTILATION - PERFUSION LUNG SCAN TECHNIQUE: Ventilation images were obtained in multiple projections using inhaled aerosol [id number]. Perfusion images were obtained in multiple projections after intravenous injection of Tc-946mAA. RADIOPHARMACEUTICALS:  3125.3illicuries [id number] aerosol inhalation and 4.1 millicurie [id number] IV COMPARISON:  10/07/2015 chest radiograph FINDINGS: Ventilation: No focal ventilation defect. Perfusion: No wedge shaped peripheral perfusion defects to suggest acute pulmonary embolism. IMPRESSION: Unremarkable exam.  No evidence of pulmonary emboli. Electronically Signed   By: JeMargarette Canada.D.    On: 10/08/2015 15:00   Ir Fluoro Guide Cv Line Right  10/19/2015  INDICATION: [age]ear old male with chronic kidney disease requiring hemodialysis. He recently had line sepsis and has been on a several day line holiday. He has a newly placed right upper extremity arteriovenous fistula which is still in the process of maturing. He needs durable dialysis access until his AVF is fully matured and accessible. EXAM: TUNNELED CENTRAL VENOUS HEMODIALYSIS CATHETER PLACEMENT WITH ULTRASOUND AND FLUOROSCOPIC GUIDANCE MEDICATIONS: Ancef 2 gm IV; The IV antibiotic was given in an appropriate time interval prior to skin puncture. CONTRAST:  None ANESTHESIA/SEDATION: Versed 1.5 mg IV; Fentanyl 75 mcg IV Total Moderate Sedation Time 16 minutes. FLUOROSCOPY TIME:  36 seconds for a total of 8 mGy COMPLICATIONS: None immediate PROCEDURE: Informed written consent was obtained from the patient after a discussion of the risks, benefits, and alternatives to treatment. Questions regarding the procedure were encouraged and answered. The right neck and chest were prepped with chlorhexidine in a sterile fashion, and a sterile drape was applied covering the operative field. Maximum barrier sterile technique with sterile gowns and gloves were used for the procedure. A timeout was performed prior to the initiation of the procedure. After creating a small venotomy incision, a micropuncture kit was utilized to access the right internal jugular vein under direct, real-time ultrasound guidance after the overlying soft tissues were anesthetized with 1% lidocaine with epinephrine. Ultrasound image documentation was performed. The microwire was kinked to measure appropriate catheter length. A stiff glidewire was advanced to the level of the IVC and the micropuncture sheath was exchanged for a peel-away sheath. A Palindrome tunneled hemodialysis catheter measuring 23 cm from tip to cuff was tunneled in a retrograde fashion from the anterior chest wall  to the venotomy incision. The catheter was then placed through the peel-away sheath with tips ultimately positioned within the superior aspect of the right atrium. Final catheter positioning was confirmed and documented with a spot radiographic image. The catheter aspirates and flushes normally. The catheter was flushed with appropriate volume heparin dwells. The catheter exit site was secured with a 0-Prolene retention suture. The venotomy incision was closed with Dermabond. Dressings were applied. The patient tolerated the procedure well without immediate post procedural complication. IMPRESSION: Successful placement of 23 cm tip to cuff tunneled hemodialysis catheter via the right internal jugular vein with tips terminating within the superior aspect of the right atrium. The catheter is ready for immediate use. Signed, Criselda Peaches, MD [facility] Electronically Signed   By: Jacqulynn Cadet M.D.   On: 10/19/2015 13:01   Ir US Guide Vasc Access Right  10/19/2015  INDICATION: 50 year old male with chronic kidney disease requiring hemodialysis. He recently had line sepsis and has been on a several day line holiday. He has a newly placed right upper extremity arteriovenous fistula which is still in the process of maturing. He needs durable dialysis access until his AVF is fully matured and accessible. EXAM: TUNNELED CENTRAL VENOUS HEMODIALYSIS CATHETER PLACEMENT WITH ULTRASOUND AND FLUOROSCOPIC GUIDANCE MEDICATIONS: Ancef 2 gm IV; The IV antibiotic was given in an appropriate time interval prior to skin puncture. CONTRAST:  None ANESTHESIA/SEDATION: Versed 1.5 mg IV; Fentanyl 75 mcg IV Total Moderate Sedation Time 16 minutes. FLUOROSCOPY TIME:  36 seconds for a total of 8 mGy COMPLICATIONS: None immediate PROCEDURE: Informed written consent was obtained from the patient after a discussion of the risks, benefits, and alternatives to treatment.  Questions regarding the procedure were encouraged and answered. The right neck and chest were prepped with chlorhexidine in a sterile fashion, and a sterile drape was applied covering the operative field. Maximum barrier sterile technique with sterile gowns and gloves were used for the procedure. A timeout was performed prior to the initiation of the procedure. After creating a small venotomy incision, a micropuncture kit was utilized to access the right internal jugular vein under direct, real-time ultrasound guidance after the overlying soft tissues were anesthetized with 1% lidocaine with epinephrine. Ultrasound image documentation was performed. The microwire was kinked to measure appropriate catheter length.  A stiff glidewire was advanced to the level of the IVC and the micropuncture sheath was exchanged for a peel-away sheath. A Palindrome tunneled hemodialysis catheter measuring 23 cm from tip to cuff was tunneled in a retrograde fashion from the anterior chest wall to the venotomy incision. The catheter was then placed through the peel-away sheath with tips ultimately positioned within the superior aspect of the right atrium. Final catheter positioning was confirmed and documented with a spot radiographic image. The catheter aspirates and flushes normally. The catheter was flushed with appropriate volume heparin dwells. The catheter exit site was secured with a 0-Prolene retention suture. The venotomy incision was closed with Dermabond. Dressings were applied. The patient tolerated the procedure well without immediate post procedural complication. IMPRESSION: Successful placement of 23 cm tip to cuff tunneled hemodialysis catheter via the right internal jugular vein with tips terminating within the superior aspect of the right atrium. The catheter is ready for immediate use. Signed, Criselda Peaches, MD [facility] Radiology Electronically Signed   By:  Jacqulynn Cadet M.D.   On: 10/19/2015 13:01   Dg Chest Port 1 View  10/15/2015  CLINICAL DATA:  Pulmonary edema. EXAM: PORTABLE CHEST 1 VIEW COMPARISON:  10/14/2015. FINDINGS: Mediastinum hilar structures stable. Cardiomegaly with pulmonary venous congestion. Interim improvement of bilateral pulmonary infiltrates/ edema. Small left pleural effusion cannot be excluded. No pneumothorax. IMPRESSION: Improving congestive heart failure. Mild persistent pulmonary edema. Small left pleural effusion cannot be excluded. Electronically Signed   By: Marcello Moores  Register   On: 10/15/2015 07:30   Dg Chest Port 1 View  10/14/2015  CLINICAL DATA:  49 year old male with history of respiratory failure. EXAM: PORTABLE CHEST 1 VIEW COMPARISON:  Chest x-ray 10/13/2015. FINDINGS: Low lung volumes. Bibasilar opacities favored to predominantly reflect atelectasis. Moderate pulmonary edema. Small bilateral pleural effusions. Mild cardiomegaly. Upper mediastinal contours are distorted by patient's positioning and rotation to the right. IMPRESSION: 1. The appearance the chest suggests congestive heart failure, as above. Electronically Signed   By: Vinnie Langton M.D.   On: 10/14/2015 07:57   Dg Chest Port 1 View  10/13/2015  CLINICAL DATA:  Pt has kidney failure, currently on BiPAP for decreased O2 levels EXAM: PORTABLE CHEST 1 VIEW COMPARISON:  10/07/2015 FINDINGS: Heart is enlarged. Significant pulmonary edema identified bilaterally. More confluent opacity is identified at the left lung base, consistent with edema or infiltrate. IMPRESSION: Pulmonary edema. Left lower lobe edema and/or infiltrate. Electronically Signed   By: Nolon Nations M.D.   On: 10/13/2015 12:55   Dg Abd Portable 1v  10/14/2015  CLINICAL DATA:  Vomiting.  Possible small bowel obstruction. EXAM: PORTABLE ABDOMEN - 1 VIEW COMPARISON:  None. FINDINGS: Two supine views. Degraded exam secondary to patient body habitus. Right femoral line. No bowel distension.  Left-sided colonic stool and gas. IMPRESSION: Limited exam, demonstrating no evidence of bowel obstruction. Electronically Signed   By: Abigail Miyamoto M.D.   On: 10/14/2015 18:54    Oren Binet, MD  [facility] Pager:[phone]  If 7PM-7AM, please contact night-coverage [email or web address] Password [email or web address] 10/24/2015, 11:28 AM   LOS: 17 days

## 2015-10-24 NOTE — Progress Notes (Signed)
[facility] Progress Note    Assessment/ Plan:   Background: 13M with CKD4T (failing renal allograft d/t CAN/recent ACR with baseline creatinine PTA around 3.6, progressive), admit with acute LLE DVT . Renal function worsened with diuresis, continued to worsen with hyperkalemia, resp fx despite holding diuretics for 2 days, requiring acute HD (1st 1/1) d/t vol overload, hyperkalemia, hypercarbic resp fx.   ASSESSMENT/RECOMMENDATIONS  1. Progressive CKD4T with AKI (ddKT 2009 [facility], CAN, recent ACR tx with IVIG and pulse steroids). Now with failed renal transplant, back on HD-->new ESRD. Has maturing R AVF (09/27/15) and TDC by IR (1/7). CLIP process started. - I think EDW will be around 129 kg. - He mentioned going to [facility] but is leaning towards [facility]. - foley d/c on 1/10  - He has a TTS first shift spot at [facility]. He needs to be there the day before 1st HD day at [facility] to fill out paperwork. If he's not able to leave soon today then he will need to be there on Friday before starting on Sat.  - Received HD today --> will need to d/c in the AM for him to get to [facility] to fill out paperwork. O/w will have to d/c on sat after HD.   2. Chronic immunosuppression on Tac/MMF/Pred. Tac reduced to 1 mg BID and will taper off over months. Continue prednisone 5 mg/day. MMF stopped altogether d/t leukopenia.  3. Hypertension - Improved with meds and volume removal. 4. Acute LLE DVT - VQ negative. Warfarin was held so that [facility] could be placed - now can resume with argatroban bridge (heparin allergy). Pharmacy managing. 5. 2HPTH on VDRA. PTH 408. Changed calcitriol to IV hectorol with HD since this is VDRA he will get at outpt HD unit. - Phosphorus improving with ca acetate --> continue for now. Will recheck a phos on wed.  6. Porcine Allergy 7. Anemia - TSat low.Started IV Fe load (rec'd 1st dose 1/7) . Darbe 100/wk started with HD 1/7.  8. CHF - markedly improved with  volume removal.  9. COPD - compensated 10. DM - per pt controlled w/ A1C of 5.5 and he was very upset about his diet. 11. 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 Gout on febuxostat 13. S/p R BVT 09/27/2015 +B/T 14. Hx/o urethral stricture - has historically been issue w/his voiding problem. May need to ask Urology to see (they have seen him in the past)  15. FEVER AND LEUKOCYTOSIS 10/15/14. HAD ENTEROCOCCAL ENDOCARDITIS AND OSTEOMYELITIS THIS PAST YEAR. HAS AORTIC VALVE INSUFF. PANCULTURED, STARTED EMPIRIC VANCO AND ZOSYN PER PHARMACY, PULLED FEM LINE AFTER HD (NOTE HE DOES NOT HAVE A PCN ALLERGY) Remains afebrile, cultures negative, may have been related to fem cath. Stop ATB's.  16. MRSA PCR +  Subjective:   Got TDC 1/7 by IR. Has remained afebrile since temp fem cath removed Blood cultures negative from 1/4 (empirically covered with vanco and zosyn but now stopped)  Feels well and c/o the food; wants to be on a regular diet. Denies cough, diarrhea, or obstructive sxs. foley d/c on 1/10.  Received HD today   Objective:   BP 129/76 mmHg  Pulse 155  Temp(Src) 98 F (36.7 C) (Oral)  Resp 20  Ht 6' (1.829 m)  Wt 128.5 kg (283 lb 4.7 oz)  BMI 38.41 kg/m2  SpO2 91%  Intake/Output Summary (Last 24 hours) at 10/24/15 1453 Last data filed at 10/24/15 1304  Gross per 24 hour  Intake    342 ml  Output   1900 ml  Net  -1558 ml   Weight change:   Physical Exam: Pleasant, depressed about being back on HD and unhappy about his diet CV: S1S2 No S3 No rub Lungs anteriorly clear ABD: Non-tender R renal allograft. + BS. EXT:LLE 1+ edema (side of DVT) none on the right Old aneurysmal AVF (nonfx) left. Arm with fair amount of bruising RUE BVT AVF +B/T (09/27/15) (BEAUTIFUL FISTULA) R IJ TDC (1/7)   Imaging: No results found.  Labs: BMET  Recent Labs Lab 10/19/15 0620 10/20/15 0630 10/21/15 0849 10/22/15 0450 10/23/15 0537  10/24/15 0604 10/24/15 0935  NA 137 138 138 137 137 137 138  K 5.1 4.2 4.5 4.5 4.3 4.5 4.1  CL 99* 97* 98* 99* 98* 98* 98*  CO2 25 30 28 28 29 29 28   GLUCOSE 306* 278* 218* 245* 227* 337* 330*  BUN 70* 34* 45* 53* 32* 41* 42*  CREATININE 8.00* 5.72* 7.94* 9.46* 7.33* 9.46* 9.45*  CALCIUM 7.5* 7.2* 7.7* 7.2* 7.5* 7.7* 7.8*  PHOS 8.2* 5.7* 7.9* 7.8* 6.0* 5.8* 5.6*   CBC  Recent Labs Lab 10/22/15 0450 10/23/15 0527 10/24/15 0604 10/24/15 0935  WBC 4.4 5.4 6.4 6.1  HGB 9.5* 8.9* 9.2* 8.7*  HCT 32.6* 30.8* 30.9* 29.5*  MCV 93.9 92.8 93.6 92.2  PLT 237 243 263 310    Medications:    . aspirin EC  81 mg Oral Daily  . calcium acetate  2,001 mg Oral TID WC  . cloNIDine  0.3 mg Transdermal Weekly  . darbepoetin (ARANESP) injection - DIALYSIS  100 mcg Intravenous Q Sat-HD  . doxercalciferol  2 mcg Intravenous Q T,Th,Sa-HD  . febuxostat  80 mg Oral Daily  . ferric gluconate (FERRLECIT/NULECIT) IV  125 mg Intravenous Q T,Th,Sa-HD  . insulin aspart  0-20 Units Subcutaneous TID WC  . insulin aspart  0-5 Units Subcutaneous QHS  . insulin aspart  6 Units Subcutaneous TID WC  . insulin glargine  35 Units Subcutaneous QHS  . labetalol  100 mg Oral TID  . pantoprazole  40 mg Oral Daily  . polyethylene glycol  17 g Oral BID  . predniSONE  5 mg Oral Q breakfast  . pregabalin  100 mg Oral QHS  . rosuvastatin  20 mg Oral QHS  . tacrolimus  1 mg Oral BID  . warfarin  7.5 mg Oral ONCE-1800  . Warfarin - Pharmacist Dosing Inpatient   Does not apply q1800      Otelia Santee, MD 10/24/2015, 2:53 PM

## 2015-10-24 NOTE — Progress Notes (Signed)
ANTICOAGULATION CONSULT NOTE - Follow Up  Pharmacy Consult for Argatroban and warfarin Indication: DVT  Patient Measurements: Height: 6' (182.9 cm) Weight: 283 lb 4.7 oz (128.5 kg) IBW/kg (Calculated) : 77.6  Vital Signs: Temp: 98 F (36.7 C) (01/12 1304) Temp Source: Oral (01/12 1304) BP: 144/67 mmHg (01/12 1304) Pulse Rate: 78 (01/12 1304)  Labs:  Recent Labs  10/22/15 0450 10/23/15 0527 10/23/15 0537 10/24/15 0604 10/24/15 0935  HGB 9.5* 8.9*  --  9.2* 8.7*  HCT 32.6* 30.8*  --  30.9* 29.5*  PLT 237 243  --  263 310  APTT 58* 67*  --  72*  --   LABPROT 22.4* 22.8*  --  29.8*  --   INR 1.98* 2.03*  --  2.89*  --   CREATININE 9.46*  --  7.33* 9.46* 9.45*    Assessment: 49 yo M admitted 12/26 and found to have DVT left popliteal vein. VQ negative for PE.  Argatroban was started on 12/26  due to porcine allergy and patient was initiated on Coumadin.  However, Argatroban was held d/t concern for head bleed and warfarin was held for placement of tunneled HD catheter. Now both are resumed. APTT remains therapeutic on 0.54mcg/kg/min.  INR 2.89 this morning.  Will need to continue overlap until INR ~4 due to interaction with Argatroban and INR.  Then will check INR off Argatroban to see if further overlap needed.  Goal of Therapy:  INR 2-3 (without influence of Argatroban) aPTT 50-90 seconds Monitor platelets by anticoagulation protocol: Yes    Plan:  -continue Argatroban at 1.65mL/hr (0.59mcg/kg/min) -warfarin 7.5mg  po x1 tonight -daily aPTT, CBC and INR  [employer], Pharm.D., BCPS Clinical Pharmacist Pager [phone] 10/24/2015 2:14 PM

## 2015-10-24 NOTE — Progress Notes (Signed)
Visit to patients room to set up on CPAP. Pt states he does not want to wear.  Advised if he changes his mind to have RN to call RT and i will set up

## 2015-10-25 LAB — APTT: aPTT: 72 seconds — ABNORMAL HIGH (ref 24–37)

## 2015-10-25 LAB — CBC
HCT: 29.9 % — ABNORMAL LOW (ref 39.0–52.0)
Hemoglobin: 8.8 g/dL — ABNORMAL LOW (ref 13.0–17.0)
MCH: 27.7 pg (ref 26.0–34.0)
MCHC: 29.4 g/dL — ABNORMAL LOW (ref 30.0–36.0)
MCV: 94 fL (ref 78.0–100.0)
Platelets: 285 10*3/uL (ref 150–400)
RBC: 3.18 MIL/uL — ABNORMAL LOW (ref 4.22–5.81)
RDW: 14.9 % (ref 11.5–15.5)
WBC: 7.1 10*3/uL (ref 4.0–10.5)

## 2015-10-25 LAB — GLUCOSE, CAPILLARY
Glucose-Capillary: 158 mg/dL — ABNORMAL HIGH (ref 65–99)
Glucose-Capillary: 160 mg/dL — ABNORMAL HIGH (ref 65–99)
Glucose-Capillary: 210 mg/dL — ABNORMAL HIGH (ref 65–99)
Glucose-Capillary: 160 mg/dL — ABNORMAL HIGH (ref 65–99)

## 2015-10-25 LAB — PROTIME-INR
INR: 3.5 — ABNORMAL HIGH (ref 0.00–1.49)
Prothrombin Time: 34.4 seconds — ABNORMAL HIGH (ref 11.6–15.2)

## 2015-10-25 MED ORDER — ARGATROBAN 50 MG/50ML IV SOLN
0.2200 ug/kg/min | INTRAVENOUS | Status: AC
  Administered 2015-10-25: 0.22 ug/kg/min via INTRAVENOUS

## 2015-10-25 MED ORDER — WARFARIN SODIUM 7.5 MG PO TABS
7.5000 mg | ORAL_TABLET | Freq: Once | ORAL | Status: AC
  Administered 2015-10-25: 7.5 mg via ORAL
  Filled 2015-10-25: qty 1

## 2015-10-25 NOTE — Progress Notes (Signed)
ANTICOAGULATION CONSULT NOTE - Follow Up  Pharmacy Consult for Argatroban and warfarin Indication: acute LLE DVT   Patient Measurements: Height: 6' (182.9 cm) Weight: 283 lb 4.7 oz (128.5 kg) IBW/kg (Calculated) : 77.6  Vital Signs: Temp: 97.9 F (36.6 C) (01/13 0628) Temp Source: Oral (01/13 0628) BP: 154/51 mmHg (01/13 0929) Pulse Rate: 68 (01/13 0929)  Labs:  Recent Labs  10/23/15 0527 10/23/15 0537 10/24/15 0604 10/24/15 0935 10/25/15 0653  HGB 8.9*  --  9.2* 8.7* 8.8*  HCT 30.8*  --  30.9* 29.5* 29.9*  PLT 243  --  263 310 285  APTT 67*  --  72*  --  72*  LABPROT 22.8*  --  29.8*  --  34.4*  INR 2.03*  --  2.89*  --  3.50*  CREATININE  --  7.33* 9.46* 9.45*  --     Assessment: 50 yo M admitted 12/26 and found to have DVT left popliteal vein. VQ negative for PE.  Argatroban was started on 12/26  due to porcine allergy and patient was initiated on Coumadin.  However, Argatroban was held d/t concern for head bleed and warfarin was held for placement of tunneled HD catheter. Now both are resumed.  -APTT is 72 seconds, it remains therapeutic on Argatroban IV drip  0.22mcg/kg/min.   -INR 3.50 this morning.  Will need to continue overlap until INR ~4 due to interaction with Argatroban and INR (argatroban elevates INR).  I have discussed the patient with Dr. Sloan Leiter and our plan per protocol is to stop the argatroban drip ~12MN and check INR 4-6 hr later. If INR is within goal of 2-3 (off argatrogan) then argatroban does not need to resume. If INR is <2 , we will need to resume Argatroban overlap.   hgb 8.8 stable, pltc wnl, no bleeding noted.   Goal of Therapy:  INR 2-3 (without influence of Argatroban) aPTT 50-90 seconds Monitor platelets by anticoagulation protocol: Yes    Plan:  -continue Argatroban at 1.47mL/hr (0.55mcg/kg/min).  -warfarin 7.5mg  po x1 tonight -Stop Argatroban at 12MN tonight.  Check INR @5am   Daily CBC, INR  If INR is <2 , we will need to resume  Argatroban overlap & reorder daily aPTT.  Thank you for allowing pharmacy to be part of this patients care team. Nicole Cella, RPh Clinical Pharmacist Pager: [phone] 10/25/2015 12:23 PM

## 2015-10-25 NOTE — Progress Notes (Signed)
[facility] Progress Note    Assessment/ Plan:   Background: 80M with CKD4T (failing renal allograft d/t CAN/recent ACR with baseline creatinine PTA around 3.6, progressive), admit with acute LLE DVT . Renal function worsened with diuresis, continued to worsen with hyperkalemia, resp fx despite holding diuretics for 2 days, requiring acute HD (1st 1/1) d/t vol overload, hyperkalemia, hypercarbic resp fx.   ASSESSMENT/RECOMMENDATIONS  1. Progressive CKD4T with AKI (ddKT 2009 [facility], CAN, recent ACR tx with IVIG and pulse steroids). Now with failed renal transplant, back on HD-->new ESRD. Has maturing R AVF (09/27/15) and TDC by IR (1/7). CLIP process started. - I think EDW will be around 129 kg. - foley d/c on 1/10  - He has a TTS first shift spot at [employer] [employer]. He needs to be there the day before 1st HD day at [facility] to fill out paperwork. If he's not able to leave soon today then he will need to be there on Friday before starting on Sat.  - Received HD 1/12 (Thur)--> will need to d/c this AM for him to get to [employer] to fill out paperwork. O/w will have to d/c on sat after HD.   2. Chronic immunosuppression on Tac/MMF/Pred. Tac reduced to 1 mg BID and will taper off over months. Continue prednisone 5 mg/day. MMF stopped altogether d/t leukopenia.  3. Hypertension - Improved with meds and volume removal. 4. Acute LLE DVT - VQ negative. Warfarin was held so that [facility] could be placed - now can resume with argatroban bridge (heparin allergy). Pharmacy managing. 5. 2HPTH on VDRA. PTH 408. Changed calcitriol to IV hectorol with HD since this is VDRA he will get at outpt HD unit. - Phosphorus improving with ca acetate --> continue for now. Will recheck a phos on wed.  6. Porcine Allergy 7. Anemia - TSat low.Started IV Fe load (rec'd 1st dose 1/7) . Darbe 100/wk started with HD 1/7.  8. CHF - markedly improved with volume removal.  9. COPD - compensated 10. DM -  per pt controlled w/ A1C of 5.5 and he was very upset about his diet. 11. 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 Gout on febuxostat 13. S/p R BVT 09/27/2015 +B/T 14. Hx/o urethral stricture - has historically been issue w/his voiding problem. May need to ask Urology to see (they have seen him in the past)  15. FEVER AND LEUKOCYTOSIS 10/15/14. HAD ENTEROCOCCAL ENDOCARDITIS AND OSTEOMYELITIS THIS PAST YEAR. HAS AORTIC VALVE INSUFF. PANCULTURED, STARTED EMPIRIC VANCO AND ZOSYN PER PHARMACY, PULLED FEM LINE AFTER HD (NOTE HE DOES NOT HAVE A PCN ALLERGY) Remains afebrile, cultures negative, may have been related to fem cath. Stop ATB's.  16. MRSA PCR +  Subjective:   Pleasant, depressed about being back on HD and unhappy about his diet. No complaints this AM.    Objective:   BP 157/39 mmHg  Pulse 70  Temp(Src) 97.9 F (36.6 C) (Oral)  Resp 18  Ht 6' (1.829 m)  Wt 128.5 kg (283 lb 4.7 oz)  BMI 38.41 kg/m2  SpO2 83%  Intake/Output Summary (Last 24 hours) at 10/25/15 0900 Last data filed at 10/25/15 [id number]  Gross per 24 hour  Intake    672 ml  Output   1900 ml  Net  -1228 ml   Weight change:   Physical Exam: Pleasant, depressed about being back on HD and unhappy about his diet CV: S1S2 No S3 No rub Lungs anteriorly clear ABD: Non-tender R  renal allograft. + BS. EXT:LLE 1+ edema (side of DVT) none on the right Old aneurysmal AVF (nonfx) left. Arm with fair amount of bruising RUE BVT AVF +B/T (09/27/15) (BEAUTIFUL FISTULA) R IJ TDC (1/7)   Imaging: No results found.  Labs: BMET  Recent Labs Lab 10/19/15 0620 10/20/15 0630 10/21/15 0849 10/22/15 0450 10/23/15 0537 10/24/15 0604 10/24/15 0935  NA 137 138 138 137 137 137 138  K 5.1 4.2 4.5 4.5 4.3 4.5 4.1  CL 99* 97* 98* 99* 98* 98* 98*  CO2 25 30 28 28 29 29 28   GLUCOSE 306* 278* 218* 245* 227* 337* 330*  BUN 70* 34* 45* 53* 32* 41* 42*  CREATININE 8.00* 5.72* 7.94* 9.46* 7.33*  9.46* 9.45*  CALCIUM 7.5* 7.2* 7.7* 7.2* 7.5* 7.7* 7.8*  PHOS 8.2* 5.7* 7.9* 7.8* 6.0* 5.8* 5.6*   CBC  Recent Labs Lab 10/23/15 0527 10/24/15 0604 10/24/15 0935 10/25/15 0653  WBC 5.4 6.4 6.1 7.1  HGB 8.9* 9.2* 8.7* 8.8*  HCT 30.8* 30.9* 29.5* 29.9*  MCV 92.8 93.6 92.2 94.0  PLT 243 263 310 285    Medications:    . aspirin EC  81 mg Oral Daily  . calcium acetate  2,001 mg Oral TID WC  . cloNIDine  0.3 mg Transdermal Weekly  . darbepoetin (ARANESP) injection - DIALYSIS  100 mcg Intravenous Q Sat-HD  . doxercalciferol  2 mcg Intravenous Q T,Th,Sa-HD  . febuxostat  80 mg Oral Daily  . ferric gluconate (FERRLECIT/NULECIT) IV  125 mg Intravenous Q T,Th,Sa-HD  . insulin aspart  0-20 Units Subcutaneous TID WC  . insulin aspart  0-5 Units Subcutaneous QHS  . insulin aspart  6 Units Subcutaneous TID WC  . insulin glargine  35 Units Subcutaneous QHS  . labetalol  100 mg Oral TID  . pantoprazole  40 mg Oral Daily  . polyethylene glycol  17 g Oral BID  . predniSONE  5 mg Oral Q breakfast  . pregabalin  100 mg Oral QHS  . rosuvastatin  20 mg Oral QHS  . tacrolimus  1 mg Oral BID  . Warfarin - Pharmacist Dosing Inpatient   Does not apply q1800      Otelia Santee, MD 10/25/2015, 9:00 AM

## 2015-10-25 NOTE — Care Management Note (Signed)
Case Management Note  Patient Details  Name: [name] [name] MRN: [id number] Date of Birth: [birth year]  Subjective/Objective:                  Patient for discharge tomorrow after HD. Explained to patient that he needs to go to [facility] HD clinic the day before HD to fill out papers (also on AVS). CSW to call and arrange [place] through the county to [place]. CM asked patient if he still refused PT (spoke with PT and they stated patient was weak). Patient agreed to be followed for [facility]. CM will set up with [facility] per patient choice.   Action/Plan:  Referral made to [facility] for [facility] PT OT SW HHA RN Expected Discharge Date:                  Expected Discharge Plan:  Home/Self Care  In-House Referral:  Clinical Social Work  Discharge planning Services  CM Consult  Post Acute Care Choice:  Home Health Choice offered to:     DME Arranged:    DME Agency:     HH Arranged:  PT HH Agency:  [facility]  Status of Service:  Completed, signed off  Medicare Important Message Given:  Yes Date Medicare IM Given:    Medicare IM give by:    Date Additional Medicare IM Given:    Additional Medicare Important Message give by:     If discussed at [place] of Stay Meetings, dates discussed:    Additional Comments:  [name], RN 10/25/2015, 12:27 PM

## 2015-10-25 NOTE — Care Management Important Message (Signed)
Important Message  Patient Details  Name: [name] [name] MRN: [id number] Date of Birth: 04/16/1966   Medicare Important Message Given:  Yes    [name], RN 10/25/2015, 12:26 [place] Message  Patient Details  Name: [name] [name] MRN: [id number] Date of Birth: 1966/04/25   Medicare Important Message Given:  Yes    [name], RN 10/25/2015, 12:26 PM

## 2015-10-25 NOTE — Progress Notes (Signed)
Pt was yelling loud. RN came into room, pt was yelling at CNA tech to get out of the room and that he did not want to have his vitals taken. Charge RN calmed pt down. Pt laying in bed. Bed low and locked. Bed alarm on. Call bell within reach. Dorita Fray 10/25/2015 3:12 PM

## 2015-10-25 NOTE — Progress Notes (Signed)
Pt did not receive his lentus on time as ordered due to the fact that pharmacy sent lesser dose than ordered i made a phone call to pharmacy to send the right dose, i have made another phone to find out why i still havent receive it, she said she is working on it and will be tube to the unit when is ready, will continue to monitor

## 2015-10-25 NOTE — Progress Notes (Addendum)
Physical Therapy Treatment Patient Details Name: [name] [name] MRN: [id number] DOB: 1966-08-12 Today's Date: 15-Nov-2015    History of Present Illness Pt adm with LLE DVT and CKD. Pt required bipap and HD was initiated. Pt with hx of renal transplant 7 years ago.  PMH - DM with neuropathy, HTN, CAD, COPD, endocarditis, back pain, osteomyelitis    PT Comments    Pt fatigued after HD yesterday. Will need to use electric scooter for any mobility other than short household distances.  Follow Up Recommendations  Home health PT     Equipment Recommendations  None recommended by PT    Recommendations for Other Services       Precautions / Restrictions Precautions Precautions: Fall Restrictions Weight Bearing Restrictions: No    Mobility  Bed Mobility Overal bed mobility: Modified Independent Bed Mobility: Sit to Supine       Sit to supine: Modified independent (Device/Increase time)      Transfers Overall transfer level: Modified independent Equipment used: Rolling walker (2 wheeled) Transfers: Sit to/from Stand Sit to Stand: Modified independent (Device/Increase time)            Ambulation/Gait Ambulation/Gait assistance: Supervision Ambulation Distance (Feet): 80 Feet Assistive device: Rolling walker (2 wheeled) Gait Pattern/deviations: Step-through pattern;Decreased stride length Gait velocity: decr Gait velocity interpretation: Below normal speed for age/gender General Gait Details: Heavy reliance on walker with UE's. Pt stopped and leaned against wall to rest for several minutes prior to returning to room.   Stairs            Wheelchair Mobility    Modified Rankin (Stroke Patients Only)       Balance Overall balance assessment: Needs assistance Sitting-balance support: No upper extremity supported Sitting balance-Leahy Scale: Good     Standing balance support: Bilateral upper extremity supported Standing balance-Leahy Scale:  Poor Standing balance comment: support of walker and supervision                    Cognition Arousal/Alertness: Awake/alert Behavior During Therapy: WFL for tasks assessed/performed Overall Cognitive Status: Within Functional Limits for tasks assessed                      Exercises      General Comments        Pertinent Vitals/Pain Pain Assessment: No/denies pain    Home Living                      Prior Function            PT Goals (current goals can now be found in the care plan section) Progress towards PT goals: Progressing toward goals;Goals met and updated - see care plan    Frequency  Min 3X/week    PT Plan Discharge plan needs to be updated    Co-evaluation             End of Session Equipment Utilized During Treatment: Gait belt Activity Tolerance: Patient limited by fatigue Patient left: in bed;with call bell/phone within reach;with bed alarm set (sitting EOB)     Time: 5320-2334 PT Time Calculation (min) (ACUTE ONLY): 16 min  Charges:  $Gait Training: 8-22 mins                    G Codes:      Daizha Anand 11-15-2015, 1:38 PM [employer] PT [phone]

## 2015-10-25 NOTE — Progress Notes (Signed)
PATIENT DETAILS Name: [name] [name] Age: 50 y.o. Sex: male Date of Birth: 1966-08-09 Admit Date: 10/07/2015 Admitting Physician Waldemar Dickens, MD DHR:[id number] Becky Augusta, MD  Subjective: No complaints-anxious to go home  Assessment/Plan: Principal Problem: Acute on chronic kidney disease stage IV-now ESRD: History of deceased donor kidney transplantation in 2009, on chronic immunosuppressive. Prior to this admission patient had worsening renal function and underwent a biopsy in August 2016 that demonstrated acute cellular rejection. Unfortunately has progressed to ESRD.Renal following.  Active Problems: Deep vein thrombosis (DVT) of left lower extremity: continue Argatoban and Coumadin overlap till INR therapeutic-plans are to stop Argatoban tonight and recheck INR in am.If therapeutic-can be discharged on 1/14  Acute hypoxic/hypercapnic respiratory failure:secondary to pulmonary edema/scheduled Valium use. Briefly required ICU stay. Now much improved, on nasal cannula. Titrate off oxygen.   Acute encephalopathy: Secondary to respiratory failure: Resolved.  Fever: Afebrile since femoral hemodialysis catheter was removed, repeat blood cultures 1/5 negative. All antibiotics have been discontinued on 1/7  Anemia: Secondary to chronic disease-hemoglobin remained stable. Follow  Urinary retention: Foley catheter removed on 1/10-voiding-follow. Has history of urethral stricture-may need urology follow-up as outpatient  History of renal transplantation 2009: Unfortunately now with ESRD. Tacrolimus reduced to 1 mg twice a day, continue prednisone 5 mg a day.   Hypertension: Controlled with volume removal during dialysis and with current antihypertensives.Continue transdermal clonidine and labetalol.   Dyslipidemia: Continue statin   Diabetes mellitus, type 2: CBGs unccontrolled-continue Lantus to 35 units daily, increase NovoLog 6 units 3 times a day and SSI. Follow  and adjust accordingly  History of COPD: Clear lungs, stable. As needed bronchodilators   Aortic Insufficiency (hx/o Aortic IE 11/2014 Enteroccoccus Faecalis): Blood cultures 1/4 negative, last echo July 2016 showed mild-to-moderate AI. Stable for outpatient follow-up  Gout:no flare-continue on febuxostat  Disposition: Remain inpatient-home tomorrow if INR acceptable  Antimicrobial agents  See below  Anti-infectives    Start     Dose/Rate Route Frequency Ordered Stop   10/19/15 1220  ceFAZolin (ANCEF) 2-3 GM-% IVPB SOLR    Comments:  Ofori, Evelyn   : cabinet override      10/19/15 1220 10/19/15 1240   10/19/15 1200  vancomycin (VANCOCIN) 1,250 mg in sodium chloride 0.9 % 250 mL IVPB     1,250 mg 250 mL/hr over 60 Minutes Intravenous Every T-Th-Sa (Hemodialysis) 10/19/15 1113 10/19/15 2011   10/16/15 1400  vancomycin (VANCOCIN) 2,500 mg in sodium chloride 0.9 % 500 mL IVPB     2,500 mg 250 mL/hr over 120 Minutes Intravenous  Once 10/16/15 1256 10/16/15 1740   10/16/15 1300  piperacillin-tazobactam (ZOSYN) IVPB 2.25 g  Status:  Discontinued     2.25 g 100 mL/hr over 30 Minutes Intravenous 3 times per day 10/16/15 1256 10/20/15 1004   10/09/15 1000  sulfamethoxazole-trimethoprim (BACTRIM,SEPTRA) 400-80 MG per tablet 1 tablet  Status:  Discontinued     1 tablet Oral Once per day on Mon Wed Fri 10/07/15 1729 10/10/15 0933   10/09/15 1000  fluconazole (DIFLUCAN) tablet 50 mg  Status:  Discontinued     50 mg Oral Every M-W-F 10/07/15 1729 10/13/15 0848   10/09/15 1000  valGANciclovir (VALCYTE) 450 MG tablet TABS 450 mg  Status:  Discontinued     450 mg Oral Once per day on Mon Wed Fri 10/07/15 1729 10/13/15 0848      DVT Prophylaxis: Argatoban/Coumadin  Code Status:  Full code   Family Communication None at bedside  Procedures: None  CONSULTS:  pulmonary/intensive care and nephrology  Time spent 25 minutes-Greater than 50% of this time was spent in counseling,  explanation of diagnosis, planning of further management, and coordination of care.  MEDICATIONS: Scheduled Meds: . aspirin EC  81 mg Oral Daily  . calcium acetate  2,001 mg Oral TID WC  . cloNIDine  0.3 mg Transdermal Weekly  . darbepoetin (ARANESP) injection - DIALYSIS  100 mcg Intravenous Q Sat-HD  . doxercalciferol  2 mcg Intravenous Q T,Th,Sa-HD  . febuxostat  80 mg Oral Daily  . ferric gluconate (FERRLECIT/NULECIT) IV  125 mg Intravenous Q T,Th,Sa-HD  . insulin aspart  0-20 Units Subcutaneous TID WC  . insulin aspart  0-5 Units Subcutaneous QHS  . insulin aspart  6 Units Subcutaneous TID WC  . insulin glargine  35 Units Subcutaneous QHS  . labetalol  100 mg Oral TID  . pantoprazole  40 mg Oral Daily  . polyethylene glycol  17 g Oral BID  . predniSONE  5 mg Oral Q breakfast  . pregabalin  100 mg Oral QHS  . rosuvastatin  20 mg Oral QHS  . tacrolimus  1 mg Oral BID  . warfarin  7.5 mg Oral ONCE-1800  . Warfarin - Pharmacist Dosing Inpatient   Does not apply q1800   Continuous Infusions: . argatroban 0.22 mcg/kg/min (10/25/15 1300)   PRN Meds:.acetaminophen **OR** acetaminophen, albuterol, bisacodyl, hydrALAZINE, HYDROcodone-acetaminophen, labetalol, ondansetron **OR** ondansetron (ZOFRAN) IV, promethazine, technetium TC 27M diethylenetriame-pentaacetic acid    PHYSICAL EXAM: Vital signs in last 24 hours: Filed Vitals:   10/24/15 1419 10/24/15 2219 10/25/15 0628 10/25/15 0929  BP: 129/76 153/69 157/39 154/51  Pulse: 155 74 70 68  Temp: 98 F (36.7 C) 98.1 F (36.7 C) 97.9 F (36.6 C)   TempSrc: Oral Oral Oral   Resp: _0 Height:      Weight:      SpO2: 91% 91% 83%     Weight change:  Filed Weights   10/22/15 0705 10/24/15 0904 10/24/15 1304  Weight: 130.5 kg (287 lb 11.2 oz) 131.4 kg (289 lb 11 oz) 128.5 kg (283 lb 4.7 oz)   Body mass index is 38.41 kg/(m^2).   Gen Exam: Awake and alert with clear speech.   Neck: Supple, No JVD.   Chest: B/L Clear  anteriorly   CVS: S1 S2 Regular  Abdomen: soft, BS +, non tender, non distended.  Extremities: + edema, lower extremities warm to touch. Neurologic: Non Focal.   Skin: No Rash.   Wounds: N/A.    Intake/Output from previous day:  Intake/Output Summary (Last 24 hours) at 10/25/15 1413 Last data filed at 10/25/15 0659  Gross per 24 hour  Intake    450 ml  Output      0 ml  Net    450 ml     LAB RESULTS: CBC  Recent Labs Lab 10/22/15 0450 10/23/15 0527 10/24/15 0604 10/24/15 0935 10/25/15 0653  WBC 4.4 5.4 6.4 6.1 7.1  HGB 9.5* 8.9* 9.2* 8.7* 8.8*  HCT 32.6* 30.8* 30.9* 29.5* 29.9*  PLT 237 243 263 310 285  MCV 93.9 92.8 93.6 92.2 94.0  MCH 27.4 26.8 27.9 27.2 27.7  MCHC 29.1* 28.9* 29.8* 29.5* 29.4*  RDW 14.9 14.8 15.0 14.9 14.9    Chemistries   Recent Labs Lab 10/21/15 0849 10/22/15 0450 10/23/15 0537 10/24/15 0604 10/24/15 0935  NA 138 137 137  137 138  K 4.5 4.5 4.3 4.5 4.1  CL 98* 99* 98* 98* 98*  CO2 _0 GLUCOSE 218* 245* 227* 337* 330*  BUN 45* 53* 32* 41* 42*  CREATININE 7.94* 9.46* 7.33* 9.46* 9.45*  CALCIUM 7.7* 7.2* 7.5* 7.7* 7.8*    CBG:  Recent Labs Lab 10/24/15 1414 10/24/15 1700 10/24/15 2216 10/24/15 2221 10/25/15 0805  GLUCAP 174* 254* 176* 181* 158*    GFR Estimated Creatinine Clearance: 12.8 mL/min (by C-G formula based on Cr of 9.45).  Coagulation profile  Recent Labs Lab 10/21/15 0849 10/22/15 0450 10/23/15 0527 10/24/15 0604 10/25/15 0653  INR 1.80* 1.98* 2.03* 2.89* 3.50*    Cardiac Enzymes No results for input(s): CKMB, TROPONINI, MYOGLOBIN in the last 168 hours.  Invalid input(s): CK  Invalid input(s): POCBNP No results for input(s): DDIMER in the last 72 hours. No results for input(s): HGBA1C in the last 72 hours. No results for input(s): CHOL, HDL, LDLCALC, TRIG, CHOLHDL, LDLDIRECT in the last 72 hours. No results for input(s): TSH, T4TOTAL, T3FREE, THYROIDAB in the last 72  hours.  Invalid input(s): FREET3 No results for input(s): VITAMINB12, FOLATE, FERRITIN, TIBC, IRON, RETICCTPCT in the last 72 hours. No results for input(s): LIPASE, AMYLASE in the last 72 hours.  Urine Studies No results for input(s): UHGB, CRYS in the last 72 hours.  Invalid input(s): UACOL, UAPR, USPG, UPH, UTP, UGL, UKET, UBIL, UNIT, UROB, ULEU, UEPI, UWBC, URBC, UBAC, CAST, UCOM, BILUA  MICROBIOLOGY: Recent Results (from the past 240 hour(s))  Culture, blood (Routine X 2) w Reflex to ID Panel     Status: None   Collection Time: 10/16/15  2:15 PM  Result Value Ref Range Status   Specimen Description BLOOD HEMODIALYSIS CATHETER  Final   Special Requests BOTTLES DRAWN AEROBIC AND ANAEROBIC 10CC  Final   Culture NO GROWTH 5 DAYS  Final   Report Status 10/21/2015 FINAL  Final  Culture, blood (Routine X 2) w Reflex to ID Panel     Status: None   Collection Time: 10/16/15  2:30 PM  Result Value Ref Range Status   Specimen Description BLOOD HEMODIALYSIS CATHETER  Final   Special Requests BOTTLES DRAWN AEROBIC AND ANAEROBIC 10CC  Final   Culture NO GROWTH 5 DAYS  Final   Report Status 10/21/2015 FINAL  Final    RADIOLOGY STUDIES/RESULTS: Dg Chest 2 View  10/17/2015  CLINICAL DATA:  Fever and weakness. EXAM: CHEST  2 VIEW COMPARISON:  10/15/2015, 06/04/2011 and 12/12/2007 FINDINGS: Patient slightly rotated to the left. Lungs are adequately inflated demonstrate no focal consolidation or effusion. There is prominence of the perihilar markings suggesting mild vascular congestion. There is mild stable cardiomegaly. Degenerative changes of the spine. Remainder the exam is unchanged. IMPRESSION: Mild stable cardiomegaly with evidence of mild vascular congestion. Electronically Signed   By: Marin Olp M.D.   On: 10/17/2015 09:04   Dg Chest 2 View  10/07/2015  CLINICAL DATA:  Pt reports shortness of breath progressively worse x 3 days, worse at night. Pt had surgery on 12/16 for dialysis  fistula; h/o HTN and DM; non-smoker EXAM: CHEST - 2 VIEW COMPARISON:  01/15/2014 FINDINGS: Moderate cardiomegaly. New central pulmonary vascular congestion. Bilateral interstitial edema or infiltrates, new since prior study. Small bilateral pleural effusions, new since previous exam. No pneumothorax. Visualized skeletal structures are unremarkable. IMPRESSION: 1. Cardiomegaly with new small effusions, bilateral interstitial edema and vascular congestion suggesting CHF. Electronically Signed   By: Keturah Barre  Vernard Gambles M.D.   On: 10/07/2015 12:10   Ct Head Wo Contrast  10/13/2015  CLINICAL DATA:  Diminished eye site, hearing difficulty, anemia. Gait disorder. PTSD. EXAM: CT HEAD WITHOUT CONTRAST TECHNIQUE: Contiguous axial images were obtained from the base of the skull through the vertex without intravenous contrast. COMPARISON:  None. FINDINGS: There is no intra or extra-axial fluid collection or mass lesion. The basilar cisterns and ventricles have a normal appearance. There is no CT evidence for acute infarction or hemorrhage. Bone windows show no acute abnormality. There is atherosclerotic calcification of the internal carotid arteries. IMPRESSION: No evidence for acute intracranial abnormality. Electronically Signed   By: Nolon Nations M.D.   On: 10/13/2015 10:58   US Renal Transplant W/doppler  10/14/2015  CLINICAL DATA:  Progressive chronic kidney disease requiring hemodialysis. Status post renal transplant in 2009. EXAM: ULTRASOUND OF RENAL TRANSPLANT WITH DOPPLER TECHNIQUE: Ultrasound examination of the renal transplant was performed with gray-scale, color and duplex doppler evaluation. COMPARISON:  None. FINDINGS: Transplant kidney location:  Right lower quadrant. Transplant kidney: Length: 11.5 cm. Morphology of the renal transplant appears abnormal with somewhat poor delineation of contours and increase in cortical echogenicity. Part of the difficulty in delineate contours is felt to be due to some adjacent  bowel gas. There may be mild prominence of the collecting system in one pole without overt hydronephrosis. Color flow in the main renal artery:  Present Color flow in the main renal vein:  Present Duplex Doppler Evaluation Resistive index in main renal artery: 1.0. Waveform appears to be of high resistance which may be reflective of transplant rejection. Venous waveform in main renal vein:  Present Resistive index in upper pole intrarenal artery:  0.9 (normal 0.6-0.8; equivocal 0.8-0.9; abnormal > 0.9) Resistive index in lower pole intrarenal artery: 0.8 (normal 0.6-0.8; equivocal 0.8-0.9; abnormal > 0.9) Bladder: Decompressed by Foley catheter. Other findings: No peri-transplant fluid collections identified by ultrasound. IMPRESSION: Abnormal sonographic appearance of the right lower quadrant renal transplant with poor delineation of contours and increased cortical echogenicity. Mild prominence of the collecting system in one pole without overt hydronephrosis. Arterial resistive indices are elevated or borderline with waveforms consistent with high resistance pattern. These findings are suggestive of transplant rejection. Electronically Signed   By: Aletta Edouard M.D.   On: 10/14/2015 09:37   Nm Pulmonary Perf And Vent  10/08/2015  CLINICAL DATA:  50 year old male with shortness of breath and known DVT. EXAM: NUCLEAR MEDICINE VENTILATION - PERFUSION LUNG SCAN TECHNIQUE: Ventilation images were obtained in multiple projections using inhaled aerosol Tc-21mDTPA. Perfusion images were obtained in multiple projections after intravenous injection of Tc-928mAA. RADIOPHARMACEUTICALS:  3184.6illicuries [id number] aerosol inhalation and 4.1 millicurie [id number] IV COMPARISON:  10/07/2015 chest radiograph FINDINGS: Ventilation: No focal ventilation defect. Perfusion: No wedge shaped peripheral perfusion defects to suggest acute pulmonary embolism. IMPRESSION: Unremarkable exam.  No evidence of  pulmonary emboli. Electronically Signed   By: JeMargarette Canada.D.   On: 10/08/2015 15:00   Ir Fluoro Guide Cv Line Right  10/19/2015  INDICATION: [age]ear old male with chronic kidney disease requiring hemodialysis. He recently had line sepsis and has been on a several day line holiday. He has a newly placed right upper extremity arteriovenous fistula which is still in the process of maturing. He needs durable dialysis access until his AVF is fully matured and accessible. EXAM: TUNNELED CENTRAL VENOUS HEMODIALYSIS CATHETER PLACEMENT WITH ULTRASOUND AND FLUOROSCOPIC GUIDANCE MEDICATIONS: Ancef 2 gm IV; The IV antibiotic was  given in an appropriate time interval prior to skin puncture. CONTRAST:  None ANESTHESIA/SEDATION: Versed 1.5 mg IV; Fentanyl 75 mcg IV Total Moderate Sedation Time 16 minutes. FLUOROSCOPY TIME:  36 seconds for a total of 8 mGy COMPLICATIONS: None immediate PROCEDURE: Informed written consent was obtained from the patient after a discussion of the risks, benefits, and alternatives to treatment. Questions regarding the procedure were encouraged and answered. The right neck and chest were prepped with chlorhexidine in a sterile fashion, and a sterile drape was applied covering the operative field. Maximum barrier sterile technique with sterile gowns and gloves were used for the procedure. A timeout was performed prior to the initiation of the procedure. After creating a small venotomy incision, a micropuncture kit was utilized to access the right internal jugular vein under direct, real-time ultrasound guidance after the overlying soft tissues were anesthetized with 1% lidocaine with epinephrine. Ultrasound image documentation was performed. The microwire was kinked to measure appropriate catheter length. A stiff glidewire was advanced to the level of the IVC and the micropuncture sheath was exchanged for a peel-away sheath. A Palindrome tunneled hemodialysis catheter measuring 23 cm from tip to cuff  was tunneled in a retrograde fashion from the anterior chest wall to the venotomy incision. The catheter was then placed through the peel-away sheath with tips ultimately positioned within the superior aspect of the right atrium. Final catheter positioning was confirmed and documented with a spot radiographic image. The catheter aspirates and flushes normally. The catheter was flushed with appropriate volume heparin dwells. The catheter exit site was secured with a 0-Prolene retention suture. The venotomy incision was closed with Dermabond. Dressings were applied. The patient tolerated the procedure well without immediate post procedural complication. IMPRESSION: Successful placement of 23 cm tip to cuff tunneled hemodialysis catheter via the right internal jugular vein with tips terminating within the superior aspect of the right atrium. The catheter is ready for immediate use. Signed, Criselda Peaches, MD [facility] Electronically Signed   By: Jacqulynn Cadet M.D.   On: 10/19/2015 13:01   Ir US Guide Vasc Access Right  10/19/2015  INDICATION: 50 year old male with chronic kidney disease requiring hemodialysis. He recently had line sepsis and has been on a several day line holiday. He has a newly placed right upper extremity arteriovenous fistula which is still in the process of maturing. He needs durable dialysis access until his AVF is fully matured and accessible. EXAM: TUNNELED CENTRAL VENOUS HEMODIALYSIS CATHETER PLACEMENT WITH ULTRASOUND AND FLUOROSCOPIC GUIDANCE MEDICATIONS: Ancef 2 gm IV; The IV antibiotic was given in an appropriate time interval prior to skin puncture. CONTRAST:  None ANESTHESIA/SEDATION: Versed 1.5 mg IV; Fentanyl 75 mcg IV Total Moderate Sedation Time 16 minutes. FLUOROSCOPY TIME:  36 seconds for a total of 8 mGy COMPLICATIONS: None immediate PROCEDURE: Informed written consent was obtained from the patient after a  discussion of the risks, benefits, and alternatives to treatment. Questions regarding the procedure were encouraged and answered. The right neck and chest were prepped with chlorhexidine in a sterile fashion, and a sterile drape was applied covering the operative field. Maximum barrier sterile technique with sterile gowns and gloves were used for the procedure. A timeout was performed prior to the initiation of the procedure. After creating a small venotomy incision, a micropuncture kit was utilized to access the right internal jugular vein under direct, real-time ultrasound guidance after the overlying soft tissues were anesthetized with 1% lidocaine with epinephrine. Ultrasound image  documentation was performed. The microwire was kinked to measure appropriate catheter length. A stiff glidewire was advanced to the level of the IVC and the micropuncture sheath was exchanged for a peel-away sheath. A Palindrome tunneled hemodialysis catheter measuring 23 cm from tip to cuff was tunneled in a retrograde fashion from the anterior chest wall to the venotomy incision. The catheter was then placed through the peel-away sheath with tips ultimately positioned within the superior aspect of the right atrium. Final catheter positioning was confirmed and documented with a spot radiographic image. The catheter aspirates and flushes normally. The catheter was flushed with appropriate volume heparin dwells. The catheter exit site was secured with a 0-Prolene retention suture. The venotomy incision was closed with Dermabond. Dressings were applied. The patient tolerated the procedure well without immediate post procedural complication. IMPRESSION: Successful placement of 23 cm tip to cuff tunneled hemodialysis catheter via the right internal jugular vein with tips terminating within the superior aspect of the right atrium. The catheter is ready for immediate use. Signed, Criselda Peaches, MD [facility] Radiology Electronically Signed   By: Jacqulynn Cadet M.D.   On: 10/19/2015 13:01   Dg Chest Port 1 View  10/15/2015  CLINICAL DATA:  Pulmonary edema. EXAM: PORTABLE CHEST 1 VIEW COMPARISON:  10/14/2015. FINDINGS: Mediastinum hilar structures stable. Cardiomegaly with pulmonary venous congestion. Interim improvement of bilateral pulmonary infiltrates/ edema. Small left pleural effusion cannot be excluded. No pneumothorax. IMPRESSION: Improving congestive heart failure. Mild persistent pulmonary edema. Small left pleural effusion cannot be excluded. Electronically Signed   By: Marcello Moores  Register   On: 10/15/2015 07:30   Dg Chest Port 1 View  10/14/2015  CLINICAL DATA:  50 year old male with history of respiratory failure. EXAM: PORTABLE CHEST 1 VIEW COMPARISON:  Chest x-ray 10/13/2015. FINDINGS: Low lung volumes. Bibasilar opacities favored to predominantly reflect atelectasis. Moderate pulmonary edema. Small bilateral pleural effusions. Mild cardiomegaly. Upper mediastinal contours are distorted by patient's positioning and rotation to the right. IMPRESSION: 1. The appearance the chest suggests congestive heart failure, as above. Electronically Signed   By: Vinnie Langton M.D.   On: 10/14/2015 07:57   Dg Chest Port 1 View  10/13/2015  CLINICAL DATA:  Pt has kidney failure, currently on BiPAP for decreased O2 levels EXAM: PORTABLE CHEST 1 VIEW COMPARISON:  10/07/2015 FINDINGS: Heart is enlarged. Significant pulmonary edema identified bilaterally. More confluent opacity is identified at the left lung base, consistent with edema or infiltrate. IMPRESSION: Pulmonary edema. Left lower lobe edema and/or infiltrate. Electronically Signed   By: Nolon Nations M.D.   On: 10/13/2015 12:55   Dg Abd Portable 1v  10/14/2015  CLINICAL DATA:  Vomiting.  Possible small bowel obstruction. EXAM: PORTABLE ABDOMEN - 1 VIEW COMPARISON:  None. FINDINGS: Two supine views. Degraded exam secondary to  patient body habitus. Right femoral line. No bowel distension. Left-sided colonic stool and gas. IMPRESSION: Limited exam, demonstrating no evidence of bowel obstruction. Electronically Signed   By: Abigail Miyamoto M.D.   On: 10/14/2015 18:54    Oren Binet, MD  [facility] Pager:[phone]  If 7PM-7AM, please contact night-coverage [email or web address] Password [email or web address] 10/25/2015, 2:13 PM   LOS: 18 days

## 2015-10-25 NOTE — Progress Notes (Signed)
CSW consulted regarding transportation to patient HD appointments. CSW contacted [employer] [phone] to set patient up for 10 am 10/28/15 to do paperwork and then for patient to get HD every Tuesday, Thursday, and Saturday at 6:40am following paperwork completion.   CSW signing off.  Percell Locus Kerissa Coia LCSWA [phone]

## 2015-10-26 LAB — CBC
HCT: 30.6 % — ABNORMAL LOW (ref 39.0–52.0)
Hemoglobin: 9 g/dL — ABNORMAL LOW (ref 13.0–17.0)
MCH: 27.8 pg (ref 26.0–34.0)
MCHC: 29.4 g/dL — ABNORMAL LOW (ref 30.0–36.0)
MCV: 94.4 fL (ref 78.0–100.0)
Platelets: 307 10*3/uL (ref 150–400)
RBC: 3.24 MIL/uL — ABNORMAL LOW (ref 4.22–5.81)
RDW: 15 % (ref 11.5–15.5)
WBC: 7.1 10*3/uL (ref 4.0–10.5)

## 2015-10-26 LAB — GLUCOSE, CAPILLARY
Glucose-Capillary: 147 mg/dL — ABNORMAL HIGH (ref 65–99)
Glucose-Capillary: 154 mg/dL — ABNORMAL HIGH (ref 65–99)
Glucose-Capillary: 156 mg/dL — ABNORMAL HIGH (ref 65–99)
Glucose-Capillary: 150 mg/dL — ABNORMAL HIGH (ref 65–99)

## 2015-10-26 LAB — RENAL FUNCTION PANEL
Albumin: 1.5 g/dL — ABNORMAL LOW (ref 3.5–5.0)
Anion gap: 15 (ref 5–15)
BUN: 26 mg/dL — ABNORMAL HIGH (ref 6–20)
CO2: 20 mmol/L — ABNORMAL LOW (ref 22–32)
Calcium: 5.8 mg/dL — CL (ref 8.9–10.3)
Chloride: 112 mmol/L — ABNORMAL HIGH (ref 101–111)
Creatinine, Ser: 6.37 mg/dL — ABNORMAL HIGH (ref 0.61–1.24)
GFR calc Af Amer: 11 mL/min — ABNORMAL LOW (ref >60)
GFR calc non Af Amer: 9 mL/min — ABNORMAL LOW (ref >60)
Glucose, Bld: 108 mg/dL — ABNORMAL HIGH (ref 65–99)
Phosphorus: 3.1 mg/dL (ref 2.5–4.6)
Potassium: 3 mmol/L — ABNORMAL LOW (ref 3.5–5.1)
Sodium: 147 mmol/L — ABNORMAL HIGH (ref 135–145)

## 2015-10-26 LAB — APTT: aPTT: 58 seconds — ABNORMAL HIGH (ref 24–37)

## 2015-10-26 LAB — PROTIME-INR
INR: 3.95 — ABNORMAL HIGH (ref 0.00–1.49)
Prothrombin Time: 37.7 seconds — ABNORMAL HIGH (ref 11.6–15.2)

## 2015-10-26 MED ORDER — TACROLIMUS 1 MG PO CAPS: 1.0000 mg | ORAL_CAPSULE | Freq: Two times a day (BID) | ORAL | Status: DC

## 2015-10-26 MED ORDER — SODIUM CHLORIDE 0.9 % IV SOLN: 100.0000 mL | INTRAVENOUS | Status: DC | PRN

## 2015-10-26 MED ORDER — INSULIN ASPART 100 UNIT/ML FLEXPEN: 6.0000 [IU] | PEN_INJECTOR | Freq: Three times a day (TID) | SUBCUTANEOUS | Status: DC

## 2015-10-26 MED ORDER — WARFARIN SODIUM 5 MG PO TABS
5.0000 mg | ORAL_TABLET | Freq: Once | ORAL | Status: AC
  Administered 2015-10-26: 5 mg via ORAL
  Filled 2015-10-26: qty 1

## 2015-10-26 MED ORDER — WARFARIN SODIUM 5 MG PO TABS: 5.0000 mg | ORAL_TABLET | Freq: Every day | ORAL | Status: DC

## 2015-10-26 MED ORDER — WARFARIN VIDEO: Freq: Once | Status: DC

## 2015-10-26 MED ORDER — LABETALOL HCL 100 MG PO TABS
100.0000 mg | ORAL_TABLET | Freq: Three times a day (TID) | ORAL | Status: DC
Start: 2015-10-26 — End: 2015-11-11

## 2015-10-26 MED ORDER — PENTAFLUOROPROP-TETRAFLUOROETH EX AERO: 1.0000 "application " | INHALATION_SPRAY | CUTANEOUS | Status: DC | PRN

## 2015-10-26 MED ORDER — CALCIUM ACETATE (PHOS BINDER) 667 MG PO CAPS: 2001.0000 mg | ORAL_CAPSULE | Freq: Three times a day (TID) | ORAL | Status: DC

## 2015-10-26 MED ORDER — ALTEPLASE 2 MG IJ SOLR: 2.0000 mg | Freq: Once | INTRAMUSCULAR | Status: DC | PRN

## 2015-10-26 MED ORDER — DOXERCALCIFEROL 4 MCG/2ML IV SOLN
INTRAVENOUS | Status: AC
  Filled 2015-10-26: qty 2

## 2015-10-26 MED ORDER — LIDOCAINE HCL (PF) 1 % IJ SOLN: 5.0000 mL | INTRAMUSCULAR | Status: DC | PRN

## 2015-10-26 MED ORDER — INSULIN GLARGINE 100 UNIT/ML SOLOSTAR PEN: 35.0000 [IU] | PEN_INJECTOR | Freq: Every day | SUBCUTANEOUS | Status: DC

## 2015-10-26 MED ORDER — INSULIN PEN NEEDLE 32G X 8 MM MISC: Status: DC

## 2015-10-26 MED ORDER — LIDOCAINE-PRILOCAINE 2.5-2.5 % EX CREA: 1.0000 "application " | TOPICAL_CREAM | CUTANEOUS | Status: DC | PRN

## 2015-10-26 MED ORDER — POTASSIUM CHLORIDE CRYS ER 20 MEQ PO TBCR
20.0000 meq | EXTENDED_RELEASE_TABLET | Freq: Once | ORAL | Status: AC
  Administered 2015-10-26: 20 meq via ORAL
  Filled 2015-10-26: qty 1

## 2015-10-26 MED ORDER — COUMADIN BOOK
Freq: Once | Status: AC
  Administered 2015-10-26: 1
  Filled 2015-10-26: qty 1

## 2015-10-26 MED ORDER — DARBEPOETIN ALFA 100 MCG/0.5ML IJ SOSY
PREFILLED_SYRINGE | INTRAMUSCULAR | Status: AC
  Filled 2015-10-26: qty 0.5

## 2015-10-26 NOTE — Discharge Instructions (Signed)

## 2015-10-26 NOTE — Progress Notes (Signed)
ANTICOAGULATION CONSULT NOTE - Follow Up  Pharmacy Consult for Warfarin Indication: acute LLE DVT   Patient Measurements: Height: 6' (182.9 cm) Weight: 283 lb 4.7 oz (128.5 kg) IBW/kg (Calculated) : 77.6  Vital Signs: Temp: 97.9 F (36.6 C) (01/14 0546) Temp Source: Oral (01/14 0546) BP: 143/45 mmHg (01/14 0546) Pulse Rate: 71 (01/14 0546)  Labs:  Recent Labs  10/24/15 0604 10/24/15 0935 10/25/15 0653 10/26/15 0555  HGB 9.2* 8.7* 8.8* 9.0*  HCT 30.9* 29.5* 29.9* 30.6*  PLT 263 310 285 307  APTT 72*  --  72* 58*  LABPROT 29.8*  --  34.4* 37.7*  INR 2.89*  --  3.50* 3.95*  CREATININE 9.46* 9.45*  --   --     Assessment: 50 yo Male with progressive CKD4T with [facility] Bogus was admitted 12/26 and found to have DVT left popliteal vein. VQ negative for PE.  Argatroban was started on 12/26  due to porcine allergy and patient was initiated on Coumadin.  However, Argatroban was held d/t concern for head bleed and warfarin was held for placement of tunneled HD catheter. -placed on 1/7. Argatroban and Coumadin resumed 1/7 post tunneled HD catheter.  He completed overlap day #7 of Argatroban/coumadin then Argatroban discontinued last night @12MN . INR checked 5 hours later to confirm that INR remained therapeutic (=2-3) while OFF argatroban infusion.   INR = 3.95 off argtroban drip. INR is SUPRAtherapeutic. Argatroban does not need to be resumed.  Plan is for patient to be discharged today after HD. Hgb 9.0 stable, pltc wnl, no bleeding noted.  Arrangements already made for RN from [facility] to come to house on Monday 1/16 to check INR.     Goal of Therapy:  INR 2-3 (without influence of Argatroban) Monitor platelets by anticoagulation protocol: Yes    Plan:  -Warfarin 5mg  po x1 tonight if pt still in hospital or I recommend discharge warfarin dose of 5mg  today and tomorrow (Sunday) since today's INR is 3.95. - Check INR on Monday 1/16 then MD to instruct on further doses.  -I  anticipate he will likely require an alternating dose between  7.5mg  and 5mg  each day to be determined by PCP.  -He will need close INR follow up initially until INR more stable/ therapeutic on a standard warfarin regimen then can reduce INR checks to weekly or as directed by PCP.     Thank you for allowing pharmacy to be part of this patients care team. Nicole Cella, RPh Clinical Pharmacist Pager: [phone] 10/26/2015 10:37 AM

## 2015-10-26 NOTE — Clinical Social Work Note (Signed)
CSW spoke with patient to discuss him getting a ride home.  Patient gave [place] phone number for his friend Durel [phone] to call him about picking him up from the hospital this evening.  Patient's friend said he will be here between 87 and 7:30pm to get him a ride home.  CSW to sign off, please reconsult if other social work needs arise.  Jones Broom. [place], MSW, [place] 10/26/2015 4:29 PM

## 2015-10-26 NOTE — Progress Notes (Signed)
Per Dr. Sloan Leiter, pt is ready to be D/C today and he needs assistance with transportation. Met with pt at bedside. He lives alone and stated that he needs assistance with transportation after his dialysis tx today. He stated that he doesn't know if the SW arrange transportation for outpt HD. Contacted Eric, SW and discussed transportation needs for today and for outpt HD.

## 2015-10-26 NOTE — Progress Notes (Signed)
Pt. Watch and ring that he had on admission was not found at discharge to home.  Pt. Stated that when he went to surgery they removed them and didn't return them.  Pt. Nurse, Greta called security to try to locate pt. Belongings but unsuccessful.  This RN call house coverage to see what to inform pt. And informed to give patient experiences number and pt. them to call them on Monday.  This RN also give pt. Director number as well.  Alphonzo Lemmings, RN

## 2015-10-26 NOTE — Progress Notes (Signed)
Patient was discharged home by MD order; discharged instructions  review and give to patient with care notes and prescriptions; IV DIC; patient will be escorted to the car by nurse tech via wheelchair.  

## 2015-10-26 NOTE — Progress Notes (Signed)
[facility] Progress Note    Assessment/ Plan:   Background: 59M with CKD4T (failing renal allograft d/t CAN/recent ACR with baseline creatinine PTA around 3.6, progressive), admit with acute LLE DVT . Renal function worsened with diuresis, continued to worsen with hyperkalemia, resp fx despite holding diuretics for 2 days, requiring acute HD (1st 1/1) d/t vol overload, hyperkalemia, hypercarbic resp fx.   ASSESSMENT/RECOMMENDATIONS  1. Progressive CKD4T with AKI (ddKT 2009 [facility], CAN, recent ACR tx with IVIG and pulse steroids). Now with failed renal transplant, back on HD-->new ESRD. Has maturing R AVF (09/27/15) and TDC by IR (1/7). CLIP process started. - I think EDW will be around 129 kg. - foley d/c on 1/10  - He has a TTS first shift spot at [employer]. He needs to be there the day before 1st HD day at [facility] to fill out paperwork. If he's not able to leave soon today then he will need to be there on Friday before starting on Sat.  - Received HD 1/12 (Thur)-->  OK to d/c on sat after HD.  - Stating that he doesn't want to go home after HD bec he'll feel weak --> unfortunately that's how many ESRD pts feel the same day after dialysis. He will experience this on the outpt regimen as well.  2. Chronic immunosuppression on Tac/MMF/Pred. Tac reduced to 1 mg BID and will taper off over months. Continue prednisone 5 mg/day. MMF stopped altogether d/t leukopenia.  3. Hypertension - Improved with meds and volume removal. 4. Acute LLE DVT - VQ negative. Warfarin was held so that [facility] could be placed - now can resume with argatroban bridge (heparin allergy). Pharmacy managing. 5. 2HPTH on VDRA. PTH 408. Changed calcitriol to IV hectorol with HD since this is VDRA he will get at outpt HD unit. - Phosphorus improving with ca acetate --> continue for now. Will recheck a phos on wed.  6. Porcine Allergy 7. Anemia - TSat low.Started IV Fe load (rec'd 1st dose 1/7) .  Darbe 100/wk started with HD 1/7.  8. CHF - markedly improved with volume removal.  9. COPD - compensated 10. DM - per pt controlled w/ A1C of 5.5 and he was very upset about his diet. 11. 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 Gout on febuxostat 13. S/p R BVT 09/27/2015 +B/T 14. Hx/o urethral stricture - has historically been issue w/his voiding problem. May need to ask Urology to see (they have seen him in the past)  15. FEVER AND LEUKOCYTOSIS 10/15/14. HAD ENTEROCOCCAL ENDOCARDITIS AND OSTEOMYELITIS THIS PAST YEAR. HAS AORTIC VALVE INSUFF. PANCULTURED, STARTED EMPIRIC VANCO AND ZOSYN PER PHARMACY, PULLED FEM LINE AFTER HD (NOTE HE DOES NOT HAVE A PCN ALLERGY) Remains afebrile, cultures negative, may have been related to fem cath. Stop ATB's.  16. MRSA PCR +  Subjective:   Pleasant, depressed about being back on HD and unhappy about his diet. No complaints this AM.    Objective:   BP 143/45 mmHg  Pulse 71  Temp(Src) 97.9 F (36.6 C) (Oral)  Resp 18  Ht 6' (1.829 m)  Wt 128.5 kg (283 lb 4.7 oz)  BMI 38.41 kg/m2  SpO2 96%  Intake/Output Summary (Last 24 hours) at 10/26/15 1014 Last data filed at 10/26/15 0859  Gross per 24 hour  Intake 370.35 ml  Output      0 ml  Net 370.35 ml   Weight change:   Physical Exam: Pleasant, depressed about being back  on HD and unhappy about his diet CV: S1S2 No S3 No rub Lungs anteriorly clear ABD: Non-tender R renal allograft. + BS. EXT:LLE 1+ edema (side of DVT) none on the right Old aneurysmal AVF (nonfx) left. Arm with fair amount of bruising RUE BVT AVF +B/T (09/27/15) (BEAUTIFUL FISTULA) R IJ TDC (1/7)   Imaging: No results found.  Labs: BMET  Recent Labs Lab 10/20/15 0630 10/21/15 0849 10/22/15 0450 10/23/15 0537 10/24/15 0604 10/24/15 0935  NA 138 138 137 137 137 138  K 4.2 4.5 4.5 4.3 4.5 4.1  CL 97* 98* 99* 98* 98* 98*  CO2 30 28 28 29 29 28   GLUCOSE 278* 218* 245* 227*  337* 330*  BUN 34* 45* 53* 32* 41* 42*  CREATININE 5.72* 7.94* 9.46* 7.33* 9.46* 9.45*  CALCIUM 7.2* 7.7* 7.2* 7.5* 7.7* 7.8*  PHOS 5.7* 7.9* 7.8* 6.0* 5.8* 5.6*   CBC  Recent Labs Lab 10/24/15 0604 10/24/15 0935 10/25/15 0653 10/26/15 0555  WBC 6.4 6.1 7.1 7.1  HGB 9.2* 8.7* 8.8* 9.0*  HCT 30.9* 29.5* 29.9* 30.6*  MCV 93.6 92.2 94.0 94.4  PLT 263 310 285 307    Medications:    . calcium acetate  2,001 mg Oral TID WC  . cloNIDine  0.3 mg Transdermal Weekly  . darbepoetin (ARANESP) injection - DIALYSIS  100 mcg Intravenous Q Sat-HD  . doxercalciferol  2 mcg Intravenous Q T,Th,Sa-HD  . febuxostat  80 mg Oral Daily  . ferric gluconate (FERRLECIT/NULECIT) IV  125 mg Intravenous Q T,Th,Sa-HD  . insulin aspart  0-20 Units Subcutaneous TID WC  . insulin aspart  0-5 Units Subcutaneous QHS  . insulin aspart  6 Units Subcutaneous TID WC  . insulin glargine  35 Units Subcutaneous QHS  . labetalol  100 mg Oral TID  . pantoprazole  40 mg Oral Daily  . polyethylene glycol  17 g Oral BID  . predniSONE  5 mg Oral Q breakfast  . pregabalin  100 mg Oral QHS  . rosuvastatin  20 mg Oral QHS  . tacrolimus  1 mg Oral BID  . Warfarin - Pharmacist Dosing Inpatient   Does not apply q1800      Otelia Santee, MD 10/26/2015, 10:14 AM

## 2015-10-26 NOTE — Discharge Summary (Signed)
PATIENT DETAILS Name: [name] [name] Age: 50 y.o. Sex: male Date of Birth: 09-30-1966 MRN: [id number]. Admitting Physician: Waldemar Dickens, MD [id number] [name] Augusta, MD  Admit Date: 10/07/2015 Discharge date: 10/26/2015  Recommendations for Outpatient Follow-up:  1. New Medication-Coumadin (for Lower ext DVT) 2. HD on TTS-now ESRD 3. Please adjust dosing of coumadin based on INR  PRIMARY DISCHARGE DIAGNOSIS:  Principal Problem:   Deep vein thrombosis (DVT) of left lower extremity (HCC) Active Problems:   Obstructive sleep apnea   KIDNEY TRANSPLANTATION, HX OF   Benign essential HTN   H/O kidney transplant   Diabetes mellitus, type 2 (HCC)   Diabetic peripheral neuropathy (HCC)   Chronic kidney disease (CKD), stage IV (severe) (HCC)   CHF (congestive heart failure) (HCC)   Hyperkalemia   Prolonged Q-T interval on ECG   Chronic deep vein thrombosis (DVT) of left lower extremity (HCC)   Acute on chronic congestive heart failure (HCC)   Acute pulmonary edema (HCC)      PAST MEDICAL HISTORY: Past Medical History  Diagnosis Date  . Hypertension   . Leg pain 02/05/11    with walking  . Weight gain   . Shortness of breath     when lying flat  . Diminished eyesight   . Hearing difficulty   . Anemia   . Nervousness(799.21)   . COPD (chronic obstructive pulmonary disease) ([place])   . CHF (congestive heart failure) ([place])   . Posttraumatic stress disorder   . Allergic rhinitis   . Dyslipidemia   . Morbid obesity ([place])   . Gait disorder   . Bilateral carpal tunnel syndrome   . Gastroparesis   . Gout   . Diabetic retinopathy ([place])   . Renal transplant, status post 04/05/2014  . Incomplete bladder emptying   . Urethral stricture   . Aortic insufficiency     a. Echo 7/16:  Mod LVH, EF 50-55%, mild to mod AI, severe LAE, PASP 57 mmHg (LV ID end diastolic 58.8 mm)  . Hyperlipidemia   . Osteomyelitis ([place])   . Endocarditis   . Vitamin D deficiency   . Secondary  hyperparathyroidism ([place])   . Rotator cuff disorder   . Discitis of lumbar region     resolved  . Obstructive sleep apnea     not using CPAP- lost 100lbs  . Claustrophobia   . Diabetes mellitus     Type II  . Diabetic peripheral neuropathy ([place])   . GERD (gastroesophageal reflux disease)   . Sepsis ([place])     Postive blood cultures -   . Endocarditis 11/28/14  . Complication of anesthesia     "they said something at [facility] it was affected by sleep aqpnea."  . Chronic kidney disease     Kidney Transplant 2009 09/26/15- transplanated kidney failing  . Chronic renal allograft nephropathy   . Chronic kidney disease (CKD), stage IV (severe) ([place])   . DVT (deep venous thrombosis) ([place]) 10/07/2015    LLE    DISCHARGE MEDICATIONS: Current Discharge Medication List    START taking these medications   Details  calcium acetate (PHOSLO) 667 MG capsule Take 3 capsules (2,001 mg total) by mouth 3 (three) times daily with meals. Qty: 270 capsule, Refills: 0    insulin aspart (NOVOLOG FLEXPEN) 100 UNIT/ML FlexPen Inject 6 Units into the skin 3 (three) times daily with meals. Qty: 15 mL, Refills: 0    Insulin Glargine (LANTUS) 100 UNIT/ML Solostar Pen Inject 35 Units into  the skin daily at 10 pm. Qty: 15 mL, Refills: 0    labetalol (NORMODYNE) 100 MG tablet Take 1 tablet (100 mg total) by mouth 3 (three) times daily. Qty: 90 tablet, Refills: 0    warfarin (COUMADIN) 5 MG tablet Take 1 tablet (5 mg total) by mouth daily at 6 PM. Starting 10/26/14 Qty: 30 tablet, Refills: 0      CONTINUE these medications which have CHANGED   Details  Insulin Pen Needle 32G X 8 MM MISC Use as directed Qty: 100 each, Refills: 0    tacrolimus (PROGRAF) 1 MG capsule Take 1 capsule (1 mg total) by mouth 2 (two) times daily. Qty: 60 capsule, Refills: 0      CONTINUE these medications which have NOT CHANGED   Details  clonazePAM (KLONOPIN) 0.5 MG tablet Take 1 mg by mouth at bedtime.       cloNIDine (CATAPRES) 0.3 MG tablet Take 0.3 mg by mouth 3 (three) times daily.     Febuxostat (ULORIC) 80 MG TABS Take 80 mg by mouth daily.    fluconazole (DIFLUCAN) 50 MG tablet Take 50 mg by mouth every Monday, Wednesday, and Friday.     omeprazole (PRILOSEC) 20 MG capsule Take 20 mg by mouth daily.    predniSONE (DELTASONE) 5 MG tablet Take 5 mg by mouth daily.      rosuvastatin (CRESTOR) 20 MG tablet Take 1 tablet (20 mg total) by mouth at bedtime. Qty: 90 tablet, Refills: 3    ACCU-CHEK FASTCLIX LANCETS MISC Use to check blood sugar 4 times per day dx code E11.9 Qty: 408 each, Refills: 1    glucose blood (ACCU-CHEK SMARTVIEW) test strip Use as instructed to check blood sugar 4 times per day dx code E11.9 Qty: 400 each, Refills: 1      STOP taking these medications     aspirin EC 81 MG tablet      calcitRIOL (ROCALTROL) 0.5 MCG capsule      furosemide (LASIX) 80 MG tablet      HYDROcodone-acetaminophen (NORCO/VICODIN) 5-325 MG tablet      Insulin Glargine (TOUJEO SOLOSTAR) 300 UNIT/ML SOPN      mycophenolate (CELLCEPT) 250 MG capsule      pregabalin (LYRICA) 100 MG capsule      tadalafil (CIALIS) 5 MG tablet      valGANciclovir (VALCYTE) 450 MG tablet      diazepam (VALIUM) 5 MG tablet      insulin regular human CONCENTRATED (HUMULIN R U-500 KWIKPEN) 500 UNIT/ML kwikpen      ondansetron (ZOFRAN) 8 MG tablet      sulfamethoxazole-trimethoprim (BACTRIM,SEPTRA) 400-80 MG tablet         ALLERGIES:   Allergies  Allergen Reactions  . Pork-Derived Products Anaphylaxis and Other (See Comments)    Fever also   . Ace Inhibitors Cough    BRIEF HPI:  See H&P, Labs, Consult and Test reports for all details in brief, patientMustapha [name] is a 50 y.o. male with a Past Medical History of CKD s/p transplant, HTN, DM, CHF, HLD, AI, COPD, GETD, who presents with DVT adn likely PE.    CONSULTATIONS:   pulmonary/intensive care and nephrology  PERTINENT  RADIOLOGIC STUDIES: Dg Chest 2 View  10/17/2015  CLINICAL DATA:  Fever and weakness. EXAM: CHEST  2 VIEW COMPARISON:  10/15/2015, 06/04/2011 and 12/12/2007 FINDINGS: Patient slightly rotated to the left. Lungs are adequately inflated demonstrate no focal consolidation or effusion. There is prominence of the perihilar markings suggesting mild  vascular congestion. There is mild stable cardiomegaly. Degenerative changes of the spine. Remainder the exam is unchanged. IMPRESSION: Mild stable cardiomegaly with evidence of mild vascular congestion. Electronically Signed   By: Marin Olp M.D.   On: 10/17/2015 09:04   Dg Chest 2 View  10/07/2015  CLINICAL DATA:  Pt reports shortness of breath progressively worse x 3 days, worse at night. Pt had surgery on 12/16 for dialysis fistula; h/o HTN and DM; non-smoker EXAM: CHEST - 2 VIEW COMPARISON:  01/15/2014 FINDINGS: Moderate cardiomegaly. New central pulmonary vascular congestion. Bilateral interstitial edema or infiltrates, new since prior study. Small bilateral pleural effusions, new since previous exam. No pneumothorax. Visualized skeletal structures are unremarkable. IMPRESSION: 1. Cardiomegaly with new small effusions, bilateral interstitial edema and vascular congestion suggesting CHF. Electronically Signed   By: Lucrezia Europe M.D.   On: 10/07/2015 12:10   Ct Head Wo Contrast  10/13/2015  CLINICAL DATA:  Diminished eye site, hearing difficulty, anemia. Gait disorder. PTSD. EXAM: CT HEAD WITHOUT CONTRAST TECHNIQUE: Contiguous axial images were obtained from the base of the skull through the vertex without intravenous contrast. COMPARISON:  None. FINDINGS: There is no intra or extra-axial fluid collection or mass lesion. The basilar cisterns and ventricles have a normal appearance. There is no CT evidence for acute infarction or hemorrhage. Bone windows show no acute abnormality. There is atherosclerotic calcification of the internal carotid arteries. IMPRESSION: No  evidence for acute intracranial abnormality. Electronically Signed   By: Nolon Nations M.D.   On: 10/13/2015 10:58   US Renal Transplant W/doppler  10/14/2015  CLINICAL DATA:  Progressive chronic kidney disease requiring hemodialysis. Status post renal transplant in 2009. EXAM: ULTRASOUND OF RENAL TRANSPLANT WITH DOPPLER TECHNIQUE: Ultrasound examination of the renal transplant was performed with gray-scale, color and duplex doppler evaluation. COMPARISON:  None. FINDINGS: Transplant kidney location:  Right lower quadrant. Transplant kidney: Length: 11.5 cm. Morphology of the renal transplant appears abnormal with somewhat poor delineation of contours and increase in cortical echogenicity. Part of the difficulty in delineate contours is felt to be due to some adjacent bowel gas. There may be mild prominence of the collecting system in one pole without overt hydronephrosis. Color flow in the main renal artery:  Present Color flow in the main renal vein:  Present Duplex Doppler Evaluation Resistive index in main renal artery: 1.0. Waveform appears to be of high resistance which may be reflective of transplant rejection. Venous waveform in main renal vein:  Present Resistive index in upper pole intrarenal artery:  0.9 (normal 0.6-0.8; equivocal 0.8-0.9; abnormal > 0.9) Resistive index in lower pole intrarenal artery: 0.8 (normal 0.6-0.8; equivocal 0.8-0.9; abnormal > 0.9) Bladder: Decompressed by Foley catheter. Other findings: No peri-transplant fluid collections identified by ultrasound. IMPRESSION: Abnormal sonographic appearance of the right lower quadrant renal transplant with poor delineation of contours and increased cortical echogenicity. Mild prominence of the collecting system in one pole without overt hydronephrosis. Arterial resistive indices are elevated or borderline with waveforms consistent with high resistance pattern. These findings are suggestive of transplant rejection. Electronically Signed    By: Aletta Edouard M.D.   On: 10/14/2015 09:37   Nm Pulmonary Perf And Vent  10/08/2015  CLINICAL DATA:  50 year old male with shortness of breath and known DVT. EXAM: NUCLEAR MEDICINE VENTILATION - PERFUSION LUNG SCAN TECHNIQUE: Ventilation images were obtained in multiple projections using inhaled aerosol Tc-52mDTPA. Perfusion images were obtained in multiple projections after intravenous injection of Tc-935mAA. RADIOPHARMACEUTICALS:  3189.3illicuries [id number]  DTPA aerosol inhalation and 4.1 millicurie [id number] MAA IV COMPARISON:  10/07/2015 chest radiograph FINDINGS: Ventilation: No focal ventilation defect. Perfusion: No wedge shaped peripheral perfusion defects to suggest acute pulmonary embolism. IMPRESSION: Unremarkable exam.  No evidence of pulmonary emboli. Electronically Signed   By: Margarette Canada M.D.   On: 10/08/2015 15:00   Ir Fluoro Guide Cv Line Right  10/19/2015  INDICATION: 50 year old male with chronic kidney disease requiring hemodialysis. He recently had line sepsis and has been on a several day line holiday. He has a newly placed right upper extremity arteriovenous fistula which is still in the process of maturing. He needs durable dialysis access until his AVF is fully matured and accessible. EXAM: TUNNELED CENTRAL VENOUS HEMODIALYSIS CATHETER PLACEMENT WITH ULTRASOUND AND FLUOROSCOPIC GUIDANCE MEDICATIONS: Ancef 2 gm IV; The IV antibiotic was given in an appropriate time interval prior to skin puncture. CONTRAST:  None ANESTHESIA/SEDATION: Versed 1.5 mg IV; Fentanyl 75 mcg IV Total Moderate Sedation Time 16 minutes. FLUOROSCOPY TIME:  36 seconds for a total of 8 mGy COMPLICATIONS: None immediate PROCEDURE: Informed written consent was obtained from the patient after a discussion of the risks, benefits, and alternatives to treatment. Questions regarding the procedure were encouraged and answered. The right neck and chest were prepped with chlorhexidine in a sterile fashion,  and a sterile drape was applied covering the operative field. Maximum barrier sterile technique with sterile gowns and gloves were used for the procedure. A timeout was performed prior to the initiation of the procedure. After creating a small venotomy incision, a micropuncture kit was utilized to access the right internal jugular vein under direct, real-time ultrasound guidance after the overlying soft tissues were anesthetized with 1% lidocaine with epinephrine. Ultrasound image documentation was performed. The microwire was kinked to measure appropriate catheter length. A stiff glidewire was advanced to the level of the IVC and the micropuncture sheath was exchanged for a peel-away sheath. A Palindrome tunneled hemodialysis catheter measuring 23 cm from tip to cuff was tunneled in a retrograde fashion from the anterior chest wall to the venotomy incision. The catheter was then placed through the peel-away sheath with tips ultimately positioned within the superior aspect of the right atrium. Final catheter positioning was confirmed and documented with a spot radiographic image. The catheter aspirates and flushes normally. The catheter was flushed with appropriate volume heparin dwells. The catheter exit site was secured with a 0-Prolene retention suture. The venotomy incision was closed with Dermabond. Dressings were applied. The patient tolerated the procedure well without immediate post procedural complication. IMPRESSION: Successful placement of 23 cm tip to cuff tunneled hemodialysis catheter via the right internal jugular vein with tips terminating within the superior aspect of the right atrium. The catheter is ready for immediate use. Signed, Criselda Peaches, MD [facility] Electronically Signed   By: Jacqulynn Cadet M.D.   On: 10/19/2015 13:01   Ir US Guide Vasc Access Right  10/19/2015  INDICATION: 50 year old male with chronic kidney  disease requiring hemodialysis. He recently had line sepsis and has been on a several day line holiday. He has a newly placed right upper extremity arteriovenous fistula which is still in the process of maturing. He needs durable dialysis access until his AVF is fully matured and accessible. EXAM: TUNNELED CENTRAL VENOUS HEMODIALYSIS CATHETER PLACEMENT WITH ULTRASOUND AND FLUOROSCOPIC GUIDANCE MEDICATIONS: Ancef 2 gm IV; The IV antibiotic was given in an appropriate time interval prior to skin puncture. CONTRAST:  None ANESTHESIA/SEDATION:  Versed 1.5 mg IV; Fentanyl 75 mcg IV Total Moderate Sedation Time 16 minutes. FLUOROSCOPY TIME:  36 seconds for a total of 8 mGy COMPLICATIONS: None immediate PROCEDURE: Informed written consent was obtained from the patient after a discussion of the risks, benefits, and alternatives to treatment. Questions regarding the procedure were encouraged and answered. The right neck and chest were prepped with chlorhexidine in a sterile fashion, and a sterile drape was applied covering the operative field. Maximum barrier sterile technique with sterile gowns and gloves were used for the procedure. A timeout was performed prior to the initiation of the procedure. After creating a small venotomy incision, a micropuncture kit was utilized to access the right internal jugular vein under direct, real-time ultrasound guidance after the overlying soft tissues were anesthetized with 1% lidocaine with epinephrine. Ultrasound image documentation was performed. The microwire was kinked to measure appropriate catheter length. A stiff glidewire was advanced to the level of the IVC and the micropuncture sheath was exchanged for a peel-away sheath. A Palindrome tunneled hemodialysis catheter measuring 23 cm from tip to cuff was tunneled in a retrograde fashion from the anterior chest wall to the venotomy incision. The catheter was then placed through the peel-away sheath with tips ultimately positioned  within the superior aspect of the right atrium. Final catheter positioning was confirmed and documented with a spot radiographic image. The catheter aspirates and flushes normally. The catheter was flushed with appropriate volume heparin dwells. The catheter exit site was secured with a 0-Prolene retention suture. The venotomy incision was closed with Dermabond. Dressings were applied. The patient tolerated the procedure well without immediate post procedural complication. IMPRESSION: Successful placement of 23 cm tip to cuff tunneled hemodialysis catheter via the right internal jugular vein with tips terminating within the superior aspect of the right atrium. The catheter is ready for immediate use. Signed, Criselda Peaches, MD [facility] Radiology Electronically Signed   By: Jacqulynn Cadet M.D.   On: 10/19/2015 13:01   Dg Chest Port 1 View  10/15/2015  CLINICAL DATA:  Pulmonary edema. EXAM: PORTABLE CHEST 1 VIEW COMPARISON:  10/14/2015. FINDINGS: Mediastinum hilar structures stable. Cardiomegaly with pulmonary venous congestion. Interim improvement of bilateral pulmonary infiltrates/ edema. Small left pleural effusion cannot be excluded. No pneumothorax. IMPRESSION: Improving congestive heart failure. Mild persistent pulmonary edema. Small left pleural effusion cannot be excluded. Electronically Signed   By: Marcello Moores  Register   On: 10/15/2015 07:30   Dg Chest Port 1 View  10/14/2015  CLINICAL DATA:  50 year old male with history of respiratory failure. EXAM: PORTABLE CHEST 1 VIEW COMPARISON:  Chest x-ray 10/13/2015. FINDINGS: Low lung volumes. Bibasilar opacities favored to predominantly reflect atelectasis. Moderate pulmonary edema. Small bilateral pleural effusions. Mild cardiomegaly. Upper mediastinal contours are distorted by patient's positioning and rotation to the right. IMPRESSION: 1. The appearance the chest suggests congestive heart failure, as  above. Electronically Signed   By: Vinnie Langton M.D.   On: 10/14/2015 07:57   Dg Chest Port 1 View  10/13/2015  CLINICAL DATA:  Pt has kidney failure, currently on BiPAP for decreased O2 levels EXAM: PORTABLE CHEST 1 VIEW COMPARISON:  10/07/2015 FINDINGS: Heart is enlarged. Significant pulmonary edema identified bilaterally. More confluent opacity is identified at the left lung base, consistent with edema or infiltrate. IMPRESSION: Pulmonary edema. Left lower lobe edema and/or infiltrate. Electronically Signed   By: Nolon Nations M.D.   On: 10/13/2015 12:55   Dg Abd Portable 1v  10/14/2015  CLINICAL DATA:  Vomiting.  Possible small bowel obstruction. EXAM: PORTABLE ABDOMEN - 1 VIEW COMPARISON:  None. FINDINGS: Two supine views. Degraded exam secondary to patient body habitus. Right femoral line. No bowel distension. Left-sided colonic stool and gas. IMPRESSION: Limited exam, demonstrating no evidence of bowel obstruction. Electronically Signed   By: Abigail Miyamoto M.D.   On: 10/14/2015 18:54     PERTINENT LAB RESULTS: CBC:  Recent Labs  10/25/15 0653 10/26/15 0555  WBC 7.1 7.1  HGB 8.8* 9.0*  HCT 29.9* 30.6*  PLT 285 307   CMET CMP     Component Value Date/Time   NA 138 10/24/2015 0935   K 4.1 10/24/2015 0935   CL 98* 10/24/2015 0935   CO2 28 10/24/2015 0935   GLUCOSE 330* 10/24/2015 0935   BUN 42* 10/24/2015 0935   CREATININE 9.45* 10/24/2015 0935   CALCIUM 7.8* 10/24/2015 0935   PROT 4.6* 10/15/2015 0335   ALBUMIN 2.4* 10/24/2015 0935   AST 15 10/15/2015 0335   ALT 11* 10/15/2015 0335   ALKPHOS 81 10/15/2015 0335   BILITOT 1.2 10/15/2015 0335   GFRNONAA 6* 10/24/2015 0935   GFRAA 7* 10/24/2015 0935    GFR Estimated Creatinine Clearance: 12.8 mL/min (by C-G formula based on Cr of 9.45). No results for input(s): LIPASE, AMYLASE in the last 72 hours. No results for input(s): CKTOTAL, CKMB, CKMBINDEX, TROPONINI in the last 72 hours. Invalid input(s): POCBNP No  results for input(s): DDIMER in the last 72 hours. No results for input(s): HGBA1C in the last 72 hours. No results for input(s): CHOL, HDL, LDLCALC, TRIG, CHOLHDL, LDLDIRECT in the last 72 hours. No results for input(s): TSH, T4TOTAL, T3FREE, THYROIDAB in the last 72 hours.  Invalid input(s): FREET3 No results for input(s): VITAMINB12, FOLATE, FERRITIN, TIBC, IRON, RETICCTPCT in the last 72 hours. Coags:  Recent Labs  10/25/15 0653 10/26/15 0555  INR 3.50* 3.95*   Microbiology: Recent Results (from the past 240 hour(s))  Culture, blood (Routine X 2) w Reflex to ID Panel     Status: None   Collection Time: 10/16/15  2:15 PM  Result Value Ref Range Status   Specimen Description BLOOD HEMODIALYSIS CATHETER  Final   Special Requests BOTTLES DRAWN AEROBIC AND ANAEROBIC 10CC  Final   Culture NO GROWTH 5 DAYS  Final   Report Status 10/21/2015 FINAL  Final  Culture, blood (Routine X 2) w Reflex to ID Panel     Status: None   Collection Time: 10/16/15  2:30 PM  Result Value Ref Range Status   Specimen Description BLOOD HEMODIALYSIS CATHETER  Final   Special Requests BOTTLES DRAWN AEROBIC AND ANAEROBIC 10CC  Final   Culture NO GROWTH 5 DAYS  Final   Report Status 10/21/2015 FINAL  Final     BRIEF HOSPITAL COURSE:  Acute on chronic kidney disease stage IV-now ESRD: History of deceased donor kidney transplantation in 2009, on chronic immunosuppressive. Prior to this admission patient had worsening renal function and underwent a biopsy in August 2016 that demonstrated acute cellular rejection. Hospital course was complicated by worsening renal function with hyperkalemia and respiratory failure-subsequently started dialysis. Now thought to have ESRD.Renal followed closely throughout this hospital stay. Outpatient HD  has been arranged-social worker has arranged for transportation. No further recommendations from nephrology-stable for discharge today.  Active Problems: Deep vein thrombosis  (DVT) of left lower extremity: allergic to Lovenox/heparin-started on Argatoban and Coumadin-anticoagulation was briefly interrupted for placement of hemodialysis catheter. I'll get the band was  subsequently discontinued at midnight, INR this morning at 3.98. We will hold Coumadin today, and resume Coumadin tomorrow at 5 mg. Case management has arranged for home health RN to draw INR on 1/16 and called this to patient's PCP, further dosing will be deferred to the outpatient setting.   Acute hypoxic/hypercapnic respiratory failure:secondary to pulmonary edema/scheduled Valium use. Briefly required ICU stay. Now much improved, on nasal cannula. Titrated off oxygen.   Acute encephalopathy: Secondary to respiratory failure: Resolved.  Fever: Afebrile since femoral hemodialysis catheter was removed, repeat blood cultures 1/5 negative. All antibiotics have been discontinued on 1/7  Anemia: Secondary to chronic disease-hemoglobin remained stable. Follow  Urinary retention: Foley catheter removed on 1/10-voiding-follow. Has history of urethral stricture-may need urology follow-up as outpatient  History of renal transplantation 2009: Unfortunately now with ESRD. Tacrolimus reduced to 1 mg twice a day, continue prednisone 5 mg a day.  Mycophenolate has been discontinued. Patient has also been discontinued off Bactrim and Valtrex. Nephrology plans to slowly titrate off these medications in the outpatient setting.   Hypertension: Controlled with volume removal during dialysis and with current antihypertensives.Continue transdermal clonidine and labetalol.   Dyslipidemia: Continue statin   Diabetes mellitus, type 2: CBGs controlled-continue Lantus to 35 units daily, and  NovoLog 6 units 3 times a day.Previously on tujeo  insulin R5 100-suspect that with significant worsening in renal function-requires lesser amount of insulin. Further optimization deferred to the outpatient setting.  History of COPD: Clear  lungs, stable. As needed bronchodilators   Aortic Insufficiency (hx/o Aortic IE 11/2014 Enteroccoccus Faecalis): Blood cultures 1/4 negative, last echo July 2016 showed mild-to-moderate AI. Stable for outpatient follow-up  Gout:no flare-continue on febuxostat  TODAY-DAY OF DISCHARGE:  Subjective:   Karem Neth today has no headache,no chest abdominal pain,no new weakness tingling or numbness, feels much better wants to go home today  Objective:   Blood pressure 143/45, pulse 71, temperature 97.9 F (36.6 C), temperature source Oral, resp. rate 18, height 6' (1.829 m), weight 128.5 kg (283 lb 4.7 oz), SpO2 96 %.  Intake/Output Summary (Last 24 hours) at 10/26/15 1235 Last data filed at 10/26/15 1159  Gross per 24 hour  Intake 490.35 ml  Output      0 ml  Net 490.35 ml   Filed Weights   10/22/15 0705 10/24/15 0904 10/24/15 1304  Weight: 130.5 kg (287 lb 11.2 oz) 131.4 kg (289 lb 11 oz) 128.5 kg (283 lb 4.7 oz)    Exam Awake Alert, Oriented *3, No new F.N deficits, Normal affect [place].AT,PERRAL Supple Neck,No JVD, No cervical lymphadenopathy appriciated.  Symmetrical Chest wall movement, Good air movement bilaterally, CTAB RRR,No Gallops,Rubs or new Murmurs, No Parasternal Heave +ve B.Sounds, Abd Soft, Non tender, No organomegaly appriciated, No rebound -guarding or rigidity. No Cyanosis, Clubbing or edema, No new Rash or bruise  DISCHARGE CONDITION: Stable  DISPOSITION: Home with home health services  DISCHARGE INSTRUCTIONS:    Activity:  As tolerated with Full fall precautions use walker/cane & assistance as needed  Get Medicines reviewed and adjusted: Please take all your medications with you for your next visit with your Primary MD  Please request your Primary MD to go over all hospital tests and procedure/radiological results at the follow up, please ask your Primary MD to get all Hospital records sent to his/her office.  If you experience worsening of your  admission symptoms, develop shortness of breath, life threatening emergency, suicidal or homicidal thoughts you must seek medical attention immediately by calling  911 or calling your MD immediately  if symptoms less severe.  You must read complete instructions/literature along with all the possible adverse reactions/side effects for all the Medicines you take and that have been prescribed to you. Take any new Medicines after you have completely understood and accpet all the possible adverse reactions/side effects.   Do not drive when taking Pain medications.   Do not take more than prescribed Pain, Sleep and Anxiety Medications  Special Instructions: If you have smoked or chewed Tobacco  in the last 2 yrs please stop smoking, stop any regular Alcohol  and or any Recreational drug use.  Wear Seat belts while driving.  Please note  You were cared for by a hospitalist during your hospital stay. Once you are discharged, your primary care physician will handle any further medical issues. Please note that NO REFILLS for any discharge medications will be authorized once you are discharged, as it is imperative that you return to your primary care physician (or establish a relationship with a primary care physician if you do not have one) for your aftercare needs so that they can reassess your need for medications and monitor your lab values.   Diet recommendation: Diabetic Diet Heart Healthy diet  Discharge Instructions    Diet - low sodium heart healthy    Complete by:  As directed      Diet Carb Modified    Complete by:  As directed      Increase activity slowly    Complete by:  As directed            Follow-up Information    Follow up with [facility].   Why:  You need to visit dialysis center a day prior to your scheduled dialysis to fill out the required paperwork.   Contact information:   For Dialysis on Tuesday Thursday and Saturday at 6:40 am.       Follow up  with [facility].   Why:  RN will come to your house on Monday to draw labs for your blood thinner. INR level. PT OT HHA RN SW services will begin as well.   Contact information:   [address] [place] [address] [phone]       Follow up with Hoyt Koch, MD. Schedule an appointment as soon as possible for a visit in 3 days.   Specialty:  Internal Medicine   Why:  Hospital follow up-repeat INR   Contact information:   [place] [phone]       Total Time spent on discharge equals  45 minutes.  SignedOren Binet 10/26/2015 12:35 PM

## 2015-10-28 ENCOUNTER — Ambulatory Visit: Payer: Self-pay

## 2015-10-28 ENCOUNTER — Ambulatory Visit: Payer: Medicare Other | Admitting: Neurology

## 2015-10-29 DIAGNOSIS — E1122 Type 2 diabetes mellitus with diabetic chronic kidney disease: Secondary | ICD-10-CM | POA: Diagnosis not present

## 2015-10-29 DIAGNOSIS — I5023 Acute on chronic systolic (congestive) heart failure: Secondary | ICD-10-CM | POA: Insufficient documentation

## 2015-10-29 DIAGNOSIS — I82492 Acute embolism and thrombosis of other specified deep vein of left lower extremity: Secondary | ICD-10-CM | POA: Diagnosis not present

## 2015-10-30 ENCOUNTER — Telehealth: Payer: Self-pay | Admitting: Internal Medicine

## 2015-10-30 DIAGNOSIS — E1122 Type 2 diabetes mellitus with diabetic chronic kidney disease: Secondary | ICD-10-CM | POA: Diagnosis not present

## 2015-10-30 DIAGNOSIS — Z7901 Long term (current) use of anticoagulants: Secondary | ICD-10-CM | POA: Diagnosis not present

## 2015-10-30 DIAGNOSIS — N186 End stage renal disease: Secondary | ICD-10-CM | POA: Diagnosis not present

## 2015-10-30 DIAGNOSIS — J449 Chronic obstructive pulmonary disease, unspecified: Secondary | ICD-10-CM | POA: Diagnosis not present

## 2015-10-30 DIAGNOSIS — S91105D Unspecified open wound of left lesser toe(s) without damage to nail, subsequent encounter: Secondary | ICD-10-CM | POA: Diagnosis not present

## 2015-10-30 DIAGNOSIS — K219 Gastro-esophageal reflux disease without esophagitis: Secondary | ICD-10-CM | POA: Diagnosis not present

## 2015-10-30 DIAGNOSIS — Z5181 Encounter for therapeutic drug level monitoring: Secondary | ICD-10-CM | POA: Diagnosis not present

## 2015-10-30 DIAGNOSIS — I509 Heart failure, unspecified: Secondary | ICD-10-CM | POA: Diagnosis not present

## 2015-10-30 DIAGNOSIS — E1142 Type 2 diabetes mellitus with diabetic polyneuropathy: Secondary | ICD-10-CM | POA: Diagnosis not present

## 2015-10-30 DIAGNOSIS — Z992 Dependence on renal dialysis: Secondary | ICD-10-CM | POA: Diagnosis not present

## 2015-10-30 DIAGNOSIS — Z940 Kidney transplant status: Secondary | ICD-10-CM | POA: Diagnosis not present

## 2015-10-30 DIAGNOSIS — I132 Hypertensive heart and chronic kidney disease with heart failure and with stage 5 chronic kidney disease, or end stage renal disease: Secondary | ICD-10-CM | POA: Diagnosis not present

## 2015-10-30 DIAGNOSIS — Z794 Long term (current) use of insulin: Secondary | ICD-10-CM | POA: Diagnosis not present

## 2015-10-30 DIAGNOSIS — I82432 Acute embolism and thrombosis of left popliteal vein: Secondary | ICD-10-CM | POA: Diagnosis not present

## 2015-10-30 NOTE — Telephone Encounter (Signed)
Claiborne Billings is calling to advise that the patient refused PT. States that he is fine, and doesn't need PT.

## 2015-10-31 ENCOUNTER — Telehealth: Payer: Self-pay | Admitting: Internal Medicine

## 2015-10-31 NOTE — Telephone Encounter (Signed)
Danielle from [facility] is requesting you call her back to discuss some labs She can be reached at [phone]

## 2015-10-31 NOTE — Telephone Encounter (Signed)
Would you please schedule a hospital follow up visit for this patient? Thanks

## 2015-10-31 NOTE — Telephone Encounter (Signed)
Noted, he is overdue for visit and just got out of the hospital.

## 2015-11-01 ENCOUNTER — Ambulatory Visit (INDEPENDENT_AMBULATORY_CARE_PROVIDER_SITE_OTHER): Payer: Medicare Other | Admitting: General Practice

## 2015-11-01 DIAGNOSIS — I82432 Acute embolism and thrombosis of left popliteal vein: Secondary | ICD-10-CM | POA: Diagnosis not present

## 2015-11-01 DIAGNOSIS — I509 Heart failure, unspecified: Secondary | ICD-10-CM | POA: Diagnosis not present

## 2015-11-01 DIAGNOSIS — N186 End stage renal disease: Secondary | ICD-10-CM | POA: Diagnosis not present

## 2015-11-01 DIAGNOSIS — I132 Hypertensive heart and chronic kidney disease with heart failure and with stage 5 chronic kidney disease, or end stage renal disease: Secondary | ICD-10-CM | POA: Diagnosis not present

## 2015-11-01 DIAGNOSIS — J449 Chronic obstructive pulmonary disease, unspecified: Secondary | ICD-10-CM | POA: Diagnosis not present

## 2015-11-01 DIAGNOSIS — E1122 Type 2 diabetes mellitus with diabetic chronic kidney disease: Secondary | ICD-10-CM | POA: Diagnosis not present

## 2015-11-01 DIAGNOSIS — Z7184 Encounter for health counseling related to travel: Secondary | ICD-10-CM | POA: Insufficient documentation

## 2015-11-01 DIAGNOSIS — Z5181 Encounter for therapeutic drug level monitoring: Secondary | ICD-10-CM

## 2015-11-01 LAB — POCT INR: INR: 4.2

## 2015-11-01 NOTE — Telephone Encounter (Signed)
Called and scheduled

## 2015-11-02 DIAGNOSIS — D689 Coagulation defect, unspecified: Secondary | ICD-10-CM | POA: Insufficient documentation

## 2015-11-04 DIAGNOSIS — I132 Hypertensive heart and chronic kidney disease with heart failure and with stage 5 chronic kidney disease, or end stage renal disease: Secondary | ICD-10-CM | POA: Diagnosis not present

## 2015-11-04 DIAGNOSIS — I509 Heart failure, unspecified: Secondary | ICD-10-CM | POA: Diagnosis not present

## 2015-11-04 DIAGNOSIS — J449 Chronic obstructive pulmonary disease, unspecified: Secondary | ICD-10-CM | POA: Diagnosis not present

## 2015-11-04 DIAGNOSIS — N186 End stage renal disease: Secondary | ICD-10-CM | POA: Diagnosis not present

## 2015-11-04 DIAGNOSIS — E1122 Type 2 diabetes mellitus with diabetic chronic kidney disease: Secondary | ICD-10-CM | POA: Diagnosis not present

## 2015-11-04 DIAGNOSIS — I82432 Acute embolism and thrombosis of left popliteal vein: Secondary | ICD-10-CM | POA: Diagnosis not present

## 2015-11-08 ENCOUNTER — Telehealth: Payer: Self-pay | Admitting: Internal Medicine

## 2015-11-08 ENCOUNTER — Ambulatory Visit (INDEPENDENT_AMBULATORY_CARE_PROVIDER_SITE_OTHER): Payer: Medicare Other | Admitting: General Practice

## 2015-11-08 DIAGNOSIS — I132 Hypertensive heart and chronic kidney disease with heart failure and with stage 5 chronic kidney disease, or end stage renal disease: Secondary | ICD-10-CM | POA: Diagnosis not present

## 2015-11-08 DIAGNOSIS — I509 Heart failure, unspecified: Secondary | ICD-10-CM | POA: Diagnosis not present

## 2015-11-08 DIAGNOSIS — N186 End stage renal disease: Secondary | ICD-10-CM | POA: Diagnosis not present

## 2015-11-08 DIAGNOSIS — E1122 Type 2 diabetes mellitus with diabetic chronic kidney disease: Secondary | ICD-10-CM | POA: Diagnosis not present

## 2015-11-08 DIAGNOSIS — J449 Chronic obstructive pulmonary disease, unspecified: Secondary | ICD-10-CM | POA: Diagnosis not present

## 2015-11-08 DIAGNOSIS — Z5181 Encounter for therapeutic drug level monitoring: Secondary | ICD-10-CM

## 2015-11-08 DIAGNOSIS — I82432 Acute embolism and thrombosis of left popliteal vein: Secondary | ICD-10-CM | POA: Diagnosis not present

## 2015-11-08 LAB — POCT INR: INR: 1.6

## 2015-11-08 NOTE — Progress Notes (Signed)
I have reviewed and agree with the plan. 

## 2015-11-08 NOTE — Telephone Encounter (Signed)
Just did INR 1.6

## 2015-11-08 NOTE — Progress Notes (Signed)
Pre visit review using our clinic review tool, if applicable. No additional management support is needed unless otherwise documented below in the visit note. 

## 2015-11-11 ENCOUNTER — Ambulatory Visit (INDEPENDENT_AMBULATORY_CARE_PROVIDER_SITE_OTHER): Payer: Medicare Other | Admitting: Internal Medicine

## 2015-11-11 ENCOUNTER — Encounter: Payer: Self-pay | Admitting: Internal Medicine

## 2015-11-11 VITALS — BP 150/60 | HR 91 | Temp 97.5°F | Ht 72.0 in | Wt 274.0 lb

## 2015-11-11 DIAGNOSIS — I10 Essential (primary) hypertension: Secondary | ICD-10-CM | POA: Diagnosis not present

## 2015-11-11 DIAGNOSIS — Z794 Long term (current) use of insulin: Secondary | ICD-10-CM | POA: Diagnosis not present

## 2015-11-11 DIAGNOSIS — N186 End stage renal disease: Secondary | ICD-10-CM | POA: Diagnosis not present

## 2015-11-11 DIAGNOSIS — E1121 Type 2 diabetes mellitus with diabetic nephropathy: Secondary | ICD-10-CM | POA: Diagnosis not present

## 2015-11-11 MED ORDER — LABETALOL HCL 100 MG PO TABS: 100.0000 mg | ORAL_TABLET | Freq: Three times a day (TID) | ORAL | Status: DC

## 2015-11-11 MED ORDER — CLONIDINE HCL 0.3 MG PO TABS: 0.3000 mg | ORAL_TABLET | Freq: Three times a day (TID) | ORAL | Status: DC

## 2015-11-11 NOTE — Assessment & Plan Note (Signed)
BP at goal on lasix, labetalol and clonidine. Refills given today as needed.

## 2015-11-11 NOTE — Progress Notes (Signed)
Pre visit review using our clinic review tool, if applicable. No additional management support is needed unless otherwise documented below in the visit note. 

## 2015-11-11 NOTE — Assessment & Plan Note (Signed)
Had some adjustments made in the hospital and now taking lantus (smaller dose) and novolog with meals. He denies low sugars but is concerned that his sugars are too high now. He would like to discuss with endo.

## 2015-11-11 NOTE — Patient Instructions (Signed)
We have sent in the refills to the pharmacy.   We recommend taking the blood thinner medicine for at least 3 months.

## 2015-11-11 NOTE — Assessment & Plan Note (Signed)
Back on Tu/Th/Sa dialysis and access intact today.

## 2015-11-11 NOTE — Progress Notes (Signed)
   Subjective:    Patient ID: [name] [name], male    DOB: May 09, 1966, 50 y.o.   MRN: [id number]  HPI The patient is a 50 YO man coming in for hospital follow up (in for DVT and renal failure now back on HD). He is doing okay since being home. Had some hallucinations with medicines in the hospital but is now on his usual regimen. Denies headaches, chest pains, SOB. Swelling in his leg is down and he denies pain. Still mobile to get around the house but uses electric scooter for longer distances. Okay being back on dialysis although he is disappointed that his kidney has failed. Still making minimal amounts of urine.    Review of Systems  Constitutional: Positive for fatigue. Negative for fever, chills, activity change and appetite change.  HENT: Negative.   Eyes: Negative.   Respiratory: Negative for cough, chest tightness, shortness of breath and wheezing.   Cardiovascular: Negative for chest pain, palpitations and leg swelling.  Gastrointestinal: Negative for nausea, abdominal pain, diarrhea, constipation and abdominal distention.  Musculoskeletal: Positive for myalgias, back pain, arthralgias and gait problem.  Skin: Negative.   Neurological: Positive for weakness and numbness. Negative for dizziness, syncope, light-headedness and headaches.  Psychiatric/Behavioral: Negative.       Objective:   Physical Exam  Constitutional: He is oriented to person, place, and time. He appears well-developed and well-nourished.  Morbidly obese, sitting on scooter which he drives somewhat erratically.   HENT:  Head: Normocephalic and atraumatic.  Eyes: EOM are normal.  Neck: Normal range of motion. No JVD present.  Cardiovascular: Normal rate.   Pulmonary/Chest: Effort normal and breath sounds normal. No respiratory distress. He has no wheezes.  Abdominal: Soft. Bowel sounds are normal. He exhibits no distension. There is no tenderness. There is no rebound.  Musculoskeletal: He exhibits no edema.    Neurological: He is alert and oriented to person, place, and time. Coordination abnormal.  Skin: Skin is warm and dry.  Psychiatric: He has a normal mood and affect.   Filed Vitals:   11/11/15 1040  BP: 150/60  Pulse: 91  Temp: 97.5 F (36.4 C)  TempSrc: Oral  Height: 6' (1.829 m)  Weight: 274 lb (124.286 kg)  SpO2: 93%      Assessment & Plan:

## 2015-11-12 ENCOUNTER — Encounter: Payer: Self-pay | Admitting: Vascular Surgery

## 2015-11-12 DIAGNOSIS — E1122 Type 2 diabetes mellitus with diabetic chronic kidney disease: Secondary | ICD-10-CM | POA: Diagnosis not present

## 2015-11-12 DIAGNOSIS — N186 End stage renal disease: Secondary | ICD-10-CM | POA: Diagnosis not present

## 2015-11-12 DIAGNOSIS — Z992 Dependence on renal dialysis: Secondary | ICD-10-CM | POA: Diagnosis not present

## 2015-11-13 ENCOUNTER — Encounter: Payer: Self-pay | Admitting: Vascular Surgery

## 2015-11-14 DIAGNOSIS — N2581 Secondary hyperparathyroidism of renal origin: Secondary | ICD-10-CM | POA: Diagnosis not present

## 2015-11-14 DIAGNOSIS — I82492 Acute embolism and thrombosis of other specified deep vein of left lower extremity: Secondary | ICD-10-CM | POA: Diagnosis not present

## 2015-11-14 DIAGNOSIS — D631 Anemia in chronic kidney disease: Secondary | ICD-10-CM | POA: Diagnosis not present

## 2015-11-14 DIAGNOSIS — N186 End stage renal disease: Secondary | ICD-10-CM | POA: Diagnosis not present

## 2015-11-15 ENCOUNTER — Emergency Department (HOSPITAL_COMMUNITY): Payer: Medicare Other

## 2015-11-15 ENCOUNTER — Ambulatory Visit: Payer: Self-pay | Admitting: Nurse Practitioner

## 2015-11-15 ENCOUNTER — Inpatient Hospital Stay (HOSPITAL_COMMUNITY)
Admission: EM | Admit: 2015-11-15 | Discharge: 2015-11-17 | DRG: 202 | Disposition: A | Discharge status: Home / Self Care | Payer: Medicare Other | Attending: Internal Medicine | Admitting: Internal Medicine

## 2015-11-15 ENCOUNTER — Telehealth: Payer: Self-pay | Admitting: General Practice

## 2015-11-15 ENCOUNTER — Ambulatory Visit (INDEPENDENT_AMBULATORY_CARE_PROVIDER_SITE_OTHER): Payer: Medicare Other | Admitting: General Practice

## 2015-11-15 ENCOUNTER — Encounter (HOSPITAL_COMMUNITY): Payer: Self-pay | Admitting: Emergency Medicine

## 2015-11-15 DIAGNOSIS — I132 Hypertensive heart and chronic kidney disease with heart failure and with stage 5 chronic kidney disease, or end stage renal disease: Secondary | ICD-10-CM | POA: Diagnosis present

## 2015-11-15 DIAGNOSIS — J209 Acute bronchitis, unspecified: Secondary | ICD-10-CM | POA: Diagnosis not present

## 2015-11-15 DIAGNOSIS — E1122 Type 2 diabetes mellitus with diabetic chronic kidney disease: Secondary | ICD-10-CM | POA: Diagnosis present

## 2015-11-15 DIAGNOSIS — Z79899 Other long term (current) drug therapy: Secondary | ICD-10-CM | POA: Diagnosis not present

## 2015-11-15 DIAGNOSIS — E1142 Type 2 diabetes mellitus with diabetic polyneuropathy: Secondary | ICD-10-CM | POA: Diagnosis present

## 2015-11-15 DIAGNOSIS — I5023 Acute on chronic systolic (congestive) heart failure: Secondary | ICD-10-CM | POA: Diagnosis not present

## 2015-11-15 DIAGNOSIS — Z7901 Long term (current) use of anticoagulants: Secondary | ICD-10-CM | POA: Diagnosis not present

## 2015-11-15 DIAGNOSIS — Z6836 Body mass index (BMI) 36.0-36.9, adult: Secondary | ICD-10-CM

## 2015-11-15 DIAGNOSIS — T8612 Kidney transplant failure: Secondary | ICD-10-CM | POA: Diagnosis present

## 2015-11-15 DIAGNOSIS — Z992 Dependence on renal dialysis: Secondary | ICD-10-CM | POA: Diagnosis not present

## 2015-11-15 DIAGNOSIS — R042 Hemoptysis: Secondary | ICD-10-CM | POA: Diagnosis not present

## 2015-11-15 DIAGNOSIS — D638 Anemia in other chronic diseases classified elsewhere: Secondary | ICD-10-CM | POA: Diagnosis present

## 2015-11-15 DIAGNOSIS — I509 Heart failure, unspecified: Secondary | ICD-10-CM

## 2015-11-15 DIAGNOSIS — Z794 Long term (current) use of insulin: Secondary | ICD-10-CM

## 2015-11-15 DIAGNOSIS — I82502 Chronic embolism and thrombosis of unspecified deep veins of left lower extremity: Secondary | ICD-10-CM | POA: Diagnosis not present

## 2015-11-15 DIAGNOSIS — E11319 Type 2 diabetes mellitus with unspecified diabetic retinopathy without macular edema: Secondary | ICD-10-CM | POA: Diagnosis present

## 2015-11-15 DIAGNOSIS — Z7952 Long term (current) use of systemic steroids: Secondary | ICD-10-CM

## 2015-11-15 DIAGNOSIS — R0602 Shortness of breath: Secondary | ICD-10-CM | POA: Diagnosis not present

## 2015-11-15 DIAGNOSIS — E8889 Other specified metabolic disorders: Secondary | ICD-10-CM | POA: Diagnosis present

## 2015-11-15 DIAGNOSIS — I5033 Acute on chronic diastolic (congestive) heart failure: Secondary | ICD-10-CM | POA: Diagnosis present

## 2015-11-15 DIAGNOSIS — N2581 Secondary hyperparathyroidism of renal origin: Secondary | ICD-10-CM | POA: Diagnosis present

## 2015-11-15 DIAGNOSIS — E785 Hyperlipidemia, unspecified: Secondary | ICD-10-CM | POA: Diagnosis present

## 2015-11-15 DIAGNOSIS — J40 Bronchitis, not specified as acute or chronic: Secondary | ICD-10-CM | POA: Diagnosis not present

## 2015-11-15 DIAGNOSIS — I82409 Acute embolism and thrombosis of unspecified deep veins of unspecified lower extremity: Secondary | ICD-10-CM

## 2015-11-15 DIAGNOSIS — N186 End stage renal disease: Secondary | ICD-10-CM

## 2015-11-15 DIAGNOSIS — D631 Anemia in chronic kidney disease: Secondary | ICD-10-CM | POA: Diagnosis present

## 2015-11-15 DIAGNOSIS — Z5181 Encounter for therapeutic drug level monitoring: Secondary | ICD-10-CM | POA: Diagnosis not present

## 2015-11-15 DIAGNOSIS — Y830 Surgical operation with transplant of whole organ as the cause of abnormal reaction of the patient, or of later complication, without mention of misadventure at the time of the procedure: Secondary | ICD-10-CM | POA: Diagnosis present

## 2015-11-15 DIAGNOSIS — F4310 Post-traumatic stress disorder, unspecified: Secondary | ICD-10-CM | POA: Diagnosis present

## 2015-11-15 DIAGNOSIS — I120 Hypertensive chronic kidney disease with stage 5 chronic kidney disease or end stage renal disease: Secondary | ICD-10-CM | POA: Diagnosis not present

## 2015-11-15 DIAGNOSIS — K219 Gastro-esophageal reflux disease without esophagitis: Secondary | ICD-10-CM | POA: Diagnosis present

## 2015-11-15 DIAGNOSIS — E669 Obesity, unspecified: Secondary | ICD-10-CM | POA: Diagnosis present

## 2015-11-15 DIAGNOSIS — I82432 Acute embolism and thrombosis of left popliteal vein: Secondary | ICD-10-CM | POA: Diagnosis not present

## 2015-11-15 DIAGNOSIS — E1121 Type 2 diabetes mellitus with diabetic nephropathy: Secondary | ICD-10-CM

## 2015-11-15 DIAGNOSIS — I5043 Acute on chronic combined systolic (congestive) and diastolic (congestive) heart failure: Secondary | ICD-10-CM | POA: Diagnosis not present

## 2015-11-15 DIAGNOSIS — I10 Essential (primary) hypertension: Secondary | ICD-10-CM

## 2015-11-15 DIAGNOSIS — E119 Type 2 diabetes mellitus without complications: Secondary | ICD-10-CM

## 2015-11-15 DIAGNOSIS — J449 Chronic obstructive pulmonary disease, unspecified: Secondary | ICD-10-CM | POA: Diagnosis not present

## 2015-11-15 DIAGNOSIS — R509 Fever, unspecified: Secondary | ICD-10-CM | POA: Diagnosis not present

## 2015-11-15 LAB — CBC WITH DIFFERENTIAL/PLATELET
Basophils Absolute: 0 10*3/uL (ref 0.0–0.1)
Basophils Relative: 0 %
Eosinophils Absolute: 0 10*3/uL (ref 0.0–0.7)
Eosinophils Relative: 1 %
HCT: 34.6 % — ABNORMAL LOW (ref 39.0–52.0)
Hemoglobin: 10.3 g/dL — ABNORMAL LOW (ref 13.0–17.0)
Lymphocytes Relative: 15 %
Lymphs Abs: 1 10*3/uL (ref 0.7–4.0)
MCH: 27.4 pg (ref 26.0–34.0)
MCHC: 29.8 g/dL — ABNORMAL LOW (ref 30.0–36.0)
MCV: 92 fL (ref 78.0–100.0)
Monocytes Absolute: 0.6 10*3/uL (ref 0.1–1.0)
Monocytes Relative: 10 %
Neutro Abs: 4.7 10*3/uL (ref 1.7–7.7)
Neutrophils Relative %: 74 %
Platelets: 169 10*3/uL (ref 150–400)
RBC: 3.76 MIL/uL — ABNORMAL LOW (ref 4.22–5.81)
RDW: 14.5 % (ref 11.5–15.5)
WBC: 6.3 10*3/uL (ref 4.0–10.5)

## 2015-11-15 LAB — BASIC METABOLIC PANEL
Anion gap: 11 (ref 5–15)
BUN: 23 mg/dL — ABNORMAL HIGH (ref 6–20)
CO2: 27 mmol/L (ref 22–32)
Calcium: 8.3 mg/dL — ABNORMAL LOW (ref 8.9–10.3)
Chloride: 101 mmol/L (ref 101–111)
Creatinine, Ser: 3.59 mg/dL — ABNORMAL HIGH (ref 0.61–1.24)
GFR calc Af Amer: 21 mL/min — ABNORMAL LOW (ref >60)
GFR calc non Af Amer: 18 mL/min — ABNORMAL LOW (ref >60)
Glucose, Bld: 300 mg/dL — ABNORMAL HIGH (ref 65–99)
Potassium: 4.7 mmol/L (ref 3.5–5.1)
Sodium: 139 mmol/L (ref 135–145)

## 2015-11-15 LAB — CBG MONITORING, ED: Glucose-Capillary: 247 mg/dL — ABNORMAL HIGH (ref 65–99)

## 2015-11-15 LAB — POCT INR: INR: 2.2

## 2015-11-15 LAB — PROTIME-INR
INR: 2.22 — ABNORMAL HIGH (ref 0.00–1.49)
Prothrombin Time: 24.4 seconds — ABNORMAL HIGH (ref 11.6–15.2)

## 2015-11-15 LAB — BRAIN NATRIURETIC PEPTIDE: B Natriuretic Peptide: 2326.4 pg/mL — ABNORMAL HIGH (ref 0.0–100.0)

## 2015-11-15 LAB — GLUCOSE, CAPILLARY: Glucose-Capillary: 236 mg/dL — ABNORMAL HIGH (ref 65–99)

## 2015-11-15 LAB — TROPONIN I: Troponin I: 0.08 ng/mL — ABNORMAL HIGH (ref <0.031)

## 2015-11-15 MED ORDER — ROSUVASTATIN CALCIUM 20 MG PO TABS
20.0000 mg | ORAL_TABLET | Freq: Every day | ORAL | Status: DC
  Administered 2015-11-15 – 2015-11-16 (×2): 20 mg via ORAL
  Filled 2015-11-15 (×2): qty 1

## 2015-11-15 MED ORDER — PANTOPRAZOLE SODIUM 40 MG PO TBEC
40.0000 mg | DELAYED_RELEASE_TABLET | Freq: Every day | ORAL | Status: DC
  Administered 2015-11-16 – 2015-11-17 (×2): 40 mg via ORAL
  Filled 2015-11-15 (×2): qty 1

## 2015-11-15 MED ORDER — INSULIN REGULAR HUMAN (CONC) 500 UNIT/ML ~~LOC~~ SOPN
10.0000 [IU] | PEN_INJECTOR | Freq: Three times a day (TID) | SUBCUTANEOUS | Status: DC
Start: 2015-11-16 — End: 2015-11-15

## 2015-11-15 MED ORDER — SODIUM CHLORIDE 0.9% FLUSH
3.0000 mL | Freq: Two times a day (BID) | INTRAVENOUS | Status: DC
  Administered 2015-11-15 – 2015-11-16 (×2): 3 mL via INTRAVENOUS

## 2015-11-15 MED ORDER — CLONAZEPAM 1 MG PO TABS
1.0000 mg | ORAL_TABLET | Freq: Every day | ORAL | Status: DC
  Administered 2015-11-15 – 2015-11-16 (×2): 1 mg via ORAL
  Filled 2015-11-15 (×2): qty 1

## 2015-11-15 MED ORDER — SODIUM CHLORIDE 0.9 % IV SOLN: 250.0000 mL | INTRAVENOUS | Status: DC | PRN

## 2015-11-15 MED ORDER — ACETAMINOPHEN 650 MG RE SUPP: 650.0000 mg | Freq: Four times a day (QID) | RECTAL | Status: DC | PRN

## 2015-11-15 MED ORDER — INSULIN GLARGINE 100 UNIT/ML ~~LOC~~ SOLN
10.0000 [IU] | Freq: Every day | SUBCUTANEOUS | Status: DC
  Administered 2015-11-15 – 2015-11-16 (×2): 10 [IU] via SUBCUTANEOUS
  Filled 2015-11-15 (×3): qty 0.1

## 2015-11-15 MED ORDER — CLONIDINE HCL 0.3 MG PO TABS
0.3000 mg | ORAL_TABLET | Freq: Three times a day (TID) | ORAL | Status: DC
  Administered 2015-11-15 – 2015-11-17 (×5): 0.3 mg via ORAL
  Filled 2015-11-15 (×5): qty 1

## 2015-11-15 MED ORDER — ONDANSETRON HCL 4 MG PO TABS: 4.0000 mg | ORAL_TABLET | Freq: Four times a day (QID) | ORAL | Status: DC | PRN

## 2015-11-15 MED ORDER — HYDROMORPHONE HCL 1 MG/ML IJ SOLN: 0.5000 mg | INTRAMUSCULAR | Status: DC | PRN

## 2015-11-15 MED ORDER — ONDANSETRON HCL 4 MG/2ML IJ SOLN: 4.0000 mg | Freq: Four times a day (QID) | INTRAMUSCULAR | Status: DC | PRN

## 2015-11-15 MED ORDER — LABETALOL HCL 100 MG PO TABS
100.0000 mg | ORAL_TABLET | Freq: Three times a day (TID) | ORAL | Status: DC
  Administered 2015-11-15 – 2015-11-17 (×5): 100 mg via ORAL
  Filled 2015-11-15 (×5): qty 1

## 2015-11-15 MED ORDER — ACETAMINOPHEN 325 MG PO TABS: 650.0000 mg | ORAL_TABLET | Freq: Four times a day (QID) | ORAL | Status: DC | PRN

## 2015-11-15 MED ORDER — SODIUM CHLORIDE 0.9% FLUSH: 3.0000 mL | INTRAVENOUS | Status: DC | PRN

## 2015-11-15 MED ORDER — INSULIN ASPART 100 UNIT/ML ~~LOC~~ SOLN
0.0000 [IU] | Freq: Every day | SUBCUTANEOUS | Status: DC
  Administered 2015-11-15: 2 [IU] via SUBCUTANEOUS

## 2015-11-15 MED ORDER — WARFARIN SODIUM 2.5 MG PO TABS
2.5000 mg | ORAL_TABLET | Freq: Once | ORAL | Status: AC
  Administered 2015-11-15: 2.5 mg via ORAL
  Filled 2015-11-15: qty 1

## 2015-11-15 MED ORDER — WARFARIN - PHARMACIST DOSING INPATIENT: Freq: Every day | Status: DC

## 2015-11-15 MED ORDER — FEBUXOSTAT 40 MG PO TABS
80.0000 mg | ORAL_TABLET | Freq: Every day | ORAL | Status: DC
  Administered 2015-11-16 – 2015-11-17 (×2): 80 mg via ORAL
  Filled 2015-11-15 (×3): qty 2

## 2015-11-15 MED ORDER — WARFARIN SODIUM 5 MG PO TABS: 5.0000 mg | ORAL_TABLET | Freq: Every day | ORAL | Status: DC

## 2015-11-15 MED ORDER — INSULIN ASPART 100 UNIT/ML ~~LOC~~ SOLN
0.0000 [IU] | Freq: Three times a day (TID) | SUBCUTANEOUS | Status: DC
  Administered 2015-11-16: 3 [IU] via SUBCUTANEOUS
  Administered 2015-11-16: 4 [IU] via SUBCUTANEOUS
  Administered 2015-11-17: 11 [IU] via SUBCUTANEOUS
  Administered 2015-11-17: 4 [IU] via SUBCUTANEOUS

## 2015-11-15 MED ORDER — HYDROCOD POLST-CPM POLST ER 10-8 MG/5ML PO SUER
5.0000 mL | Freq: Two times a day (BID) | ORAL | Status: DC | PRN
  Administered 2015-11-15 – 2015-11-16 (×3): 5 mL via ORAL
  Filled 2015-11-15 (×3): qty 5

## 2015-11-15 MED ORDER — CALCIUM ACETATE (PHOS BINDER) 667 MG PO CAPS
2001.0000 mg | ORAL_CAPSULE | Freq: Three times a day (TID) | ORAL | Status: DC
  Administered 2015-11-16: 2001 mg via ORAL
  Filled 2015-11-15: qty 3

## 2015-11-15 MED ORDER — TACROLIMUS 1 MG PO CAPS
1.0000 mg | ORAL_CAPSULE | Freq: Two times a day (BID) | ORAL | Status: DC
  Administered 2015-11-15 – 2015-11-16 (×2): 1 mg via ORAL
  Filled 2015-11-15 (×2): qty 1

## 2015-11-15 MED ORDER — OXYCODONE HCL 5 MG PO TABS: 5.0000 mg | ORAL_TABLET | ORAL | Status: DC | PRN

## 2015-11-15 MED ORDER — PREDNISONE 5 MG PO TABS
5.0000 mg | ORAL_TABLET | Freq: Every day | ORAL | Status: DC
  Administered 2015-11-16 – 2015-11-17 (×2): 5 mg via ORAL
  Filled 2015-11-15 (×2): qty 1

## 2015-11-15 NOTE — Telephone Encounter (Signed)
Donita, home health RN called to report that patient has a oral temp of 102.8 today.  HR/119, BP - 130/84.  Please advise.  Donita's # is [phone]

## 2015-11-15 NOTE — ED Notes (Addendum)
Pt from home  for eval of sob that started last night, pt denies any chest pain at this time. Pt states he has been having dry cough, states home health RN came today and states "she heard buzzing in my lungs." pt alert and oriented in triage. nad noted. Pt is dialysis pt, received full tx yesterday. Denies any new swelling to extremities.

## 2015-11-15 NOTE — Telephone Encounter (Signed)
Would recommend visit somewhere today for evaluation.

## 2015-11-15 NOTE — Progress Notes (Signed)
Pre visit review using our clinic review tool, if applicable. No additional management support is needed unless otherwise documented below in the visit note. 

## 2015-11-15 NOTE — Progress Notes (Signed)
ANTICOAGULATION CONSULT NOTE - Initial Consult  Pharmacy Consult for Coumadin Indication: DVT (recent history 10/07/15)   Allergies  Allergen Reactions  . Pork-Derived Products Anaphylaxis and Other (See Comments)    Fever also   . Ace Inhibitors Cough  . Hydrocodone Other (See Comments)    Hallucinations    Patient Measurements: Height: 6' (182.9 cm) Weight: 262 lb 6.4 oz (119.024 kg) IBW/kg (Calculated) : 77.6  Vital Signs: Temp: 98.8 F (37.1 C) (02/03 2109) Temp Source: Oral (02/03 2109) BP: 168/58 mmHg (02/03 2109) Pulse Rate: 113 (02/03 2109)  Labs:  Recent Labs  11/15/15 11/15/15 1845  HGB  --  10.3*  HCT  --  34.6*  PLT  --  169  LABPROT  --  24.4*  INR 2.2 2.22*  CREATININE  --  3.59*  TROPONINI  --  0.08*    Estimated Creatinine Clearance: 32.4 mL/min (by C-G formula based on Cr of 3.59).   Medical History: Past Medical History  Diagnosis Date  . Hypertension   . Leg pain 02/05/11    with walking  . Weight gain   . Shortness of breath     when lying flat  . Diminished eyesight   . Hearing difficulty   . Anemia   . Nervousness(799.21)   . COPD (chronic obstructive pulmonary disease) ([place])   . CHF (congestive heart failure) ([place])   . Posttraumatic stress disorder   . Allergic rhinitis   . Dyslipidemia   . Morbid obesity ([place])   . Gait disorder   . Bilateral carpal tunnel syndrome   . Gastroparesis   . Gout   . Diabetic retinopathy ([place])   . Renal transplant, status post 04/05/2014  . Incomplete bladder emptying   . Urethral stricture   . Aortic insufficiency     a. Echo 7/16:  Mod LVH, EF 50-55%, mild to mod AI, severe LAE, PASP 57 mmHg (LV ID end diastolic [id number] mm)  . Hyperlipidemia   . Osteomyelitis ([place])   . Endocarditis   . Vitamin D deficiency   . Secondary hyperparathyroidism ([place])   . Rotator cuff disorder   . Discitis of lumbar region     resolved  . Obstructive sleep apnea     not using CPAP- lost 100lbs  .  Claustrophobia   . Diabetes mellitus     Type II  . Diabetic peripheral neuropathy ([place])   . GERD (gastroesophageal reflux disease)   . Sepsis ([place])     Postive blood cultures -   . Endocarditis 11/28/14  . Complication of anesthesia     "they said something at [facility] it was affected by sleep aqpnea."  . Chronic kidney disease     Kidney Transplant 2009 09/26/15- transplanated kidney failing  . Chronic renal allograft nephropathy   . Chronic kidney disease (CKD), stage IV (severe) ([place])   . DVT (deep venous thrombosis) ([place]) 10/07/2015    LLE    Medications:  Prescriptions prior to admission  Medication Sig Dispense Refill Last Dose  . calcium acetate (PHOSLO) 667 MG capsule Take 3 capsules (2,001 mg total) by mouth 3 (three) times daily with meals. 270 capsule 0 11/15/2015 at Unknown time  . clonazePAM (KLONOPIN) 0.5 MG tablet Take 1 mg by mouth at bedtime.    11/14/2015 at Unknown time  . cloNIDine (CATAPRES) 0.3 MG tablet Take 1 tablet (0.3 mg total) by mouth 3 (three) times daily. 270 tablet 1 11/14/2015 at Unknown time  . Febuxostat (ULORIC) 80  MG TABS Take 80 mg by mouth daily.   11/14/2015 at Unknown time  . insulin aspart (NOVOLOG FLEXPEN) 100 UNIT/ML FlexPen Inject 6 Units into the skin 3 (three) times daily with meals. 15 mL 0 Past Week at Unknown time  . Insulin Glargine (LANTUS) 100 UNIT/ML Solostar Pen Inject 35 Units into the skin daily at 10 pm. 15 mL 0 Past Week at Unknown time  . labetalol (NORMODYNE) 100 MG tablet Take 1 tablet (100 mg total) by mouth 3 (three) times daily. 90 tablet 0 11/14/2015  . omeprazole (PRILOSEC) 20 MG capsule Take 20 mg by mouth daily.   11/14/2015 at Unknown time  . predniSONE (DELTASONE) 5 MG tablet Take 5 mg by mouth daily.     11/14/2015 at Unknown time  . rosuvastatin (CRESTOR) 20 MG tablet Take 1 tablet (20 mg total) by mouth at bedtime. 90 tablet 3 11/14/2015 at Unknown time  . tacrolimus (PROGRAF) 1 MG capsule Take 1 capsule (1 mg total) by  mouth 2 (two) times daily. 60 capsule 0 11/14/2015 at Unknown time  . tadalafil (CIALIS) 5 MG tablet TAKE 1 TABLET EVERY DAY   11/14/2015 at Unknown time  . warfarin (COUMADIN) 5 MG tablet Take 1 tablet (5 mg total) by mouth daily at 6 PM. Starting 10/26/14 (Patient taking differently: Take 2.5-5 mg by mouth daily at 6 PM. Take 1/2 tablet (2.5mg  on Monday and Friday, whole tablet (5mg ) other days.) 30 tablet 0 11/14/2015 at Unknown time  . ACCU-CHEK FASTCLIX LANCETS MISC Use to check blood sugar 4 times per day dx code E11.9 408 each 1 Taking  . fluconazole (DIFLUCAN) 50 MG tablet Take 50 mg by mouth every Monday, Wednesday, and Friday.    11/13/2015  . glucose blood (ACCU-CHEK SMARTVIEW) test strip Use as instructed to check blood sugar 4 times per day dx code E11.9 400 each 1 Taking  . Insulin Pen Needle 32G X 8 MM MISC Use as directed 100 each 0 Taking  . insulin regular human CONCENTRATED (HUMULIN R U-500 KWIKPEN) 500 UNIT/ML kwikpen Inject into the skin. Discontinued last hospital admission (10/26/15)   Not Taking at Unknown time    Assessment: 50 yo M on Coumadin PTA for recent DVT diagnosis (at that time VQ was negative for PE).  He was discharged 10/26/15 and dosing well until 2d ago when he developed SOB and coughing, unable to lay flat to rest.  Pt also reports small amounts of hemoptysis.  INR drawn today by home health RN was 2.2 per patient report.  Pt has not had his Coumadin dose today.  PMH: CKD s/p failed renal transplant with HD TTS, HTN, DM, HF, HLD, COPD, DVT  Goal of Therapy:  INR 2-3 Monitor platelets by anticoagulation protocol: Yes   Plan:  Coumadin 2.5mg  PO x 1 tonight Daily INR Follow up plans for repeat VQ scan  [employer], Pharm.D., BCPS Clinical Pharmacist Pager [phone] 11/15/2015 10:30 PM   I spoke with Mr Iwanicki to clarify his home medications.  He reports the only this that has changed since his recent discharge was his Coumadin dose.  I have updated his  medication history to confirm that he is using Lantus and Novolog, NOT U-500 insulin.  He is supposed to be taking Lantus 35 units each evening but has decreased it to 10 units for the last 2 days since he has been eating less since becoming ill.  He is tapering down on his tacrolimus dose per Dr. Sanda Klein orders  and is on 1mg  BID.  No longer on mycophenolate.  I will discuss this updated information with admitting MD.  Updated Coumadin dose is 5mg  daily except 2.5mg  on Mon and Fri.  [employer], Pharm.D., BCPS Clinical Pharmacist Pager [phone] 11/15/2015 10:32 PM

## 2015-11-15 NOTE — ED Provider Notes (Signed)
CSN: [id number]     Arrival date & time 11/15/15  1522 History   First MD Initiated Contact with Patient 11/15/15 1813     Chief Complaint  Patient presents with  . Shortness of Breath  . Cough     (Consider location/radiation/quality/duration/timing/severity/associated sxs/prior Treatment) Patient is a 50 y.o. male presenting with cough. The history is provided by the patient.  Cough Cough characteristics:  Non-productive and dry Severity:  Moderate Onset quality:  Gradual Duration:  2 days Timing:  Constant Progression:  Worsening Chronicity:  New Smoker: no   Associated symptoms: fever (states had fever at home yesterday but none here) and shortness of breath   Associated symptoms: no chills   Associated symptoms comment:  Blood tinged sputum starting on arrival Risk factors comment:  Recent hospitalization, anticoagulated for known DVT   Past Medical History  Diagnosis Date  . Hypertension   . Leg pain 02/05/11    with walking  . Weight gain   . Shortness of breath     when lying flat  . Diminished eyesight   . Hearing difficulty   . Anemia   . Nervousness(799.21)   . COPD (chronic obstructive pulmonary disease) ([place])   . CHF (congestive heart failure) ([place])   . Posttraumatic stress disorder   . Allergic rhinitis   . Dyslipidemia   . Morbid obesity ([place])   . Gait disorder   . Bilateral carpal tunnel syndrome   . Gastroparesis   . Gout   . Diabetic retinopathy ([place])   . Renal transplant, status post 04/05/2014  . Incomplete bladder emptying   . Urethral stricture   . Aortic insufficiency     a. Echo 7/16:  Mod LVH, EF 50-55%, mild to mod AI, severe LAE, PASP 57 mmHg (LV ID end diastolic Q000111Q mm)  . Hyperlipidemia   . Osteomyelitis ([place])   . Endocarditis   . Vitamin D deficiency   . Secondary hyperparathyroidism ([place])   . Rotator cuff disorder   . Discitis of lumbar region     resolved  . Obstructive sleep apnea     not using CPAP- lost 100lbs  .  Claustrophobia   . Diabetes mellitus     Type II  . Diabetic peripheral neuropathy ([place])   . GERD (gastroesophageal reflux disease)   . Sepsis ([place])     Postive blood cultures -   . Endocarditis 11/28/14  . Complication of anesthesia     "they said something at [facility] it was affected by sleep aqpnea."  . Chronic kidney disease     Kidney Transplant 2009 09/26/15- transplanated kidney failing  . Chronic renal allograft nephropathy   . Chronic kidney disease (CKD), stage IV (severe) ([place])   . DVT (deep venous thrombosis) ([place]) 10/07/2015    LLE   Past Surgical History  Procedure Laterality Date  . Kidney transplant  2009  . Cataract extraction w/ intraocular lens  implant, bilateral Bilateral   . Av fistula placement Bilateral 2015  . Arteriovenous graft placement Bilateral "several"  . Cystoscopy w/ internal urethrotomy  09/2014  . Bascilic vein transposition Right 09/27/2015  . Bascilic vein transposition Right 09/27/2015    Procedure: BASILIC VEIN TRANSPOSITION right;  Surgeon: Mal Misty, MD;  Location: [place];  Service: Vascular;  Laterality: Right;   Family History  Problem Relation Age of Onset  . Hypertension Mother   . Diabetes Father    Social History  Substance Use Topics  . Smoking status:  Never Smoker   . Smokeless tobacco: Never Used  . Alcohol Use: No    Review of Systems  Constitutional: Positive for fever (states had fever at home yesterday but none here). Negative for chills.  Respiratory: Positive for cough and shortness of breath.   All other systems reviewed and are negative.     Allergies  Pork-derived products; Ace inhibitors; and Hydrocodone  Home Medications   Prior to Admission medications   Medication Sig Start Date End Date Taking? Authorizing Provider  calcium acetate (PHOSLO) 667 MG capsule Take 3 capsules (2,001 mg total) by mouth 3 (three) times daily with meals. 10/26/15  Yes Shanker Kristeen Mans, MD  clonazePAM (KLONOPIN)  0.5 MG tablet Take 1 mg by mouth at bedtime.    Yes Historical Provider, MD  cloNIDine (CATAPRES) 0.3 MG tablet Take 1 tablet (0.3 mg total) by mouth 3 (three) times daily. 11/11/15  Yes Hoyt Koch, MD  Febuxostat (ULORIC) 80 MG TABS Take 80 mg by mouth daily.   Yes Historical Provider, MD  insulin regular human CONCENTRATED (HUMULIN R U-500 KWIKPEN) 500 UNIT/ML kwikpen Inject into the skin. 10 every morning, 25 units every afternoon, and 20 in the evening   Yes Historical Provider, MD  labetalol (NORMODYNE) 100 MG tablet Take 1 tablet (100 mg total) by mouth 3 (three) times daily. 11/11/15  Yes Hoyt Koch, MD  omeprazole (PRILOSEC) 20 MG capsule Take 20 mg by mouth daily.   Yes Historical Provider, MD  predniSONE (DELTASONE) 5 MG tablet Take 5 mg by mouth daily.     Yes Historical Provider, MD  rosuvastatin (CRESTOR) 20 MG tablet Take 1 tablet (20 mg total) by mouth at bedtime. 04/11/15  Yes Hoyt Koch, MD  tacrolimus (PROGRAF) 1 MG capsule Take 1 capsule (1 mg total) by mouth 2 (two) times daily. 10/26/15  Yes Shanker Kristeen Mans, MD  tadalafil (CIALIS) 5 MG tablet TAKE 1 TABLET EVERY DAY 10/23/15  Yes Historical Provider, MD  warfarin (COUMADIN) 5 MG tablet Take 1 tablet (5 mg total) by mouth daily at 6 PM. Starting 10/26/14 10/27/15  Yes Shanker Kristeen Mans, MD  ACCU-CHEK FASTCLIX LANCETS MISC Use to check blood sugar 4 times per day dx code E11.9 04/09/15   Elayne Snare, MD  fluconazole (DIFLUCAN) 50 MG tablet Take 50 mg by mouth every Monday, Wednesday, and Friday.     Historical Provider, MD  glucose blood (ACCU-CHEK SMARTVIEW) test strip Use as instructed to check blood sugar 4 times per day dx code E11.9 06/25/15   Elayne Snare, MD  insulin aspart (NOVOLOG FLEXPEN) 100 UNIT/ML FlexPen Inject 6 Units into the skin 3 (three) times daily with meals. Patient not taking: Reported on 11/15/2015 10/26/15   Jonetta Osgood, MD  Insulin Glargine (LANTUS) 100 UNIT/ML Solostar Pen Inject 35  Units into the skin daily at 10 pm. Patient not taking: Reported on 11/15/2015 10/26/15   Jonetta Osgood, MD  Insulin Pen Needle 32G X 8 MM MISC Use as directed 10/26/15   Jonetta Osgood, MD   BP 199/54 mmHg  Pulse 108  Temp(Src) 97.7 F (36.5 C) (Oral)  Resp 20  Ht 6' (1.829 m)  Wt 270 lb 1 oz (122.5 kg)  BMI 36.62 kg/m2  SpO2 92% Physical Exam  Constitutional: He is oriented to person, place, and time. He appears well-developed and well-nourished. No distress.  HENT:  Head: Normocephalic and atraumatic.  Eyes: Conjunctivae are normal.  Neck: Neck supple. No tracheal  deviation present.  Cardiovascular: Regular rhythm and normal heart sounds.  Frequent extrasystoles are present. Tachycardia present.   Pulmonary/Chest: Effort normal. No respiratory distress. He has no decreased breath sounds. He has no wheezes. He has no rhonchi. He has no rales.  Abdominal: Soft. He exhibits no distension. There is no tenderness.  Neurological: He is alert and oriented to person, place, and time. He has normal strength. No cranial nerve deficit. GCS eye subscore is 4. GCS verbal subscore is 5. GCS motor subscore is 6.  Skin: Skin is warm and dry.  Psychiatric: He has a normal mood and affect.    ED Course  Procedures (including critical care time) Labs Review Labs Reviewed  CBC WITH DIFFERENTIAL/PLATELET - Abnormal; Notable for the following:    RBC 3.76 (*)    Hemoglobin 10.3 (*)    HCT 34.6 (*)    MCHC 29.8 (*)    All other components within normal limits  BASIC METABOLIC PANEL - Abnormal; Notable for the following:    Glucose, Bld 300 (*)    BUN 23 (*)    Creatinine, Ser 3.59 (*)    Calcium 8.3 (*)    GFR calc non Af Amer 18 (*)    GFR calc Af Amer 21 (*)    All other components within normal limits  BRAIN NATRIURETIC PEPTIDE - Abnormal; Notable for the following:    B Natriuretic Peptide 2326.4 (*)    All other components within normal limits  TROPONIN I - Abnormal; Notable for  the following:    Troponin I 0.08 (*)    All other components within normal limits  PROTIME-INR - Abnormal; Notable for the following:    Prothrombin Time 24.4 (*)    INR 2.22 (*)    All other components within normal limits  CBG MONITORING, ED - Abnormal; Notable for the following:    Glucose-Capillary 247 (*)    All other components within normal limits    Imaging Review Dg Chest 2 View  11/15/2015  CLINICAL DATA:  Fever and shortness of Breath EXAM: CHEST  2 VIEW COMPARISON:  10/17/2015 FINDINGS: Cardiac shadow is mildly enlarged. A right-sided dialysis catheter is noted in satisfactory position. The lungs are well aerated bilaterally. Mild central congestion is again seen with mild interstitial edema. No focal infiltrate or sizable effusion is noted. IMPRESSION: Mild CHF.  No focal confluent infiltrate is seen. Electronically Signed   By: Inez Catalina M.D.   On: 11/15/2015 16:11   I have personally reviewed and evaluated these images and lab results as part of my medical decision-making.   EKG Interpretation   Date/Time:  Friday November 15 2015 18:35:03 EST Ventricular Rate:  115 PR Interval:    QRS Duration: 160 QT Interval:  405 QTC Calculation: 560 R Axis:   -67 Text Interpretation:  indeterminate rhythm multiple PVCs RBBB and LAFB  Abnormal T, consider ischemia, lateral leads Baseline wander in lead(s)  III aVL aVF V1 V2 last tracing 10/14/2015 was sinus rhythm with similar  morphology Confirmed by Arianis Bowditch MD, Quillian Quince [id number]) on 11/15/2015 6:43:05 PM      MDM   Final diagnoses:  Cough with hemoptysis  Anticoagulated on Coumadin  ESRD on dialysis [facility])    50 y.o. male presents with cough, new onset small volume hemoptysis, mild SHoB without oxygen requirement. Has persistent ongoing tachycardia with similar appearance to prior EKGs without definite P-waves concerning for sinus vs junctional tachycardia. Anticoagulated appropriately on coumadin for known LE DVT found  on last  admission. Has rising BNP, CXR appearance of CHF concerning for exacerbation. Will require admission for monitoring, formal echo, possibly V/Q scan to evaluate for PE clot burden precipitating hemoptysis and heart failure. Hospitalist was consulted for admission and will see the patient in the emergency department. I spoke with nephrology regarding the Pt and they will consult and make dialysis recommendations. No emergent indications for dialysis currently.    Leo Grosser, MD 11/15/15 2030

## 2015-11-15 NOTE — H&P (Signed)
[facility] Admission History and Physical       Selman Parziale [id number] DOB: Jun 25, 1966 DOA: 11/15/2015  Referring physician: EDP PCP: Hoyt Koch, MD  Specialists:   Chief Complaint: Cough and SOB  HPI: [name] [name] is a 50 y.o. male with a history of ESRD on HD, Hx of DVT on Coumadin Rx, Chronic Diastolic CHF, HTN and DM2 who presents to the ED with complaints of increased SOB and Cough over the past 48 hours.  He was told he had a fever of 102 at home by his home health nurse.   He reports coughing up yellowish mucus, and had an episode of hemoptysis in the ED.    His PT/INR was found to be 2.2.       Review of Systems:  Constitutional: No Weight Loss, No Weight Gain, Night Sweats, +Fevers, Chills, Dizziness, Light Headedness, Fatigue, or Generalized Weakness HEENT: No Headaches, Difficulty Swallowing,Tooth/Dental Problems,Sore Throat,  No Sneezing, Rhinitis, Ear Ache, Nasal Congestion, or Post Nasal Drip,  Cardio-vascular:  No Chest pain, Orthopnea, PND, Edema in Lower Extremities, Anasarca, Dizziness, Palpitations  Resp:  +Dyspnea, No DOE, +Productive Cough, No Non-Productive Cough, No Hemoptysis, No Wheezing.    GI: No Heartburn, Indigestion, Abdominal Pain, Nausea, Vomiting, Diarrhea, Constipation, Hematemesis, Hematochezia, Melena, Change in Bowel Habits,  Loss of Appetite  GU: No Dysuria, No Change in Color of Urine, No Urgency or Urinary Frequency, No Flank pain.  Musculoskeletal: No Joint Pain or Swelling, No Decreased Range of Motion, No Back Pain.  Neurologic: No Syncope, No Seizures, Muscle Weakness, Paresthesia, Vision Disturbance or Loss, No Diplopia, No Vertigo, No Difficulty Walking,  Skin: No Rash or Lesions. Psych: No Change in Mood or Affect, No Depression or Anxiety, No Memory loss, No Confusion, or Hallucinations   Past Medical History  Diagnosis Date  . Hypertension   . Leg pain 02/05/11    with walking  . Weight gain   . Shortness  of breath     when lying flat  . Diminished eyesight   . Hearing difficulty   . Anemia   . Nervousness(799.21)   . COPD (chronic obstructive pulmonary disease) ([place])   . CHF (congestive heart failure) ([place])   . Posttraumatic stress disorder   . Allergic rhinitis   . Dyslipidemia   . Morbid obesity ([place])   . Gait disorder   . Bilateral carpal tunnel syndrome   . Gastroparesis   . Gout   . Diabetic retinopathy ([place])   . Renal transplant, status post 04/05/2014  . Incomplete bladder emptying   . Urethral stricture   . Aortic insufficiency     a. Echo 7/16:  Mod LVH, EF 50-55%, mild to mod AI, severe LAE, PASP 57 mmHg (LV ID end diastolic Q000111Q mm)  . Hyperlipidemia   . Osteomyelitis ([place])   . Endocarditis   . Vitamin D deficiency   . Secondary hyperparathyroidism ([place])   . Rotator cuff disorder   . Discitis of lumbar region     resolved  . Obstructive sleep apnea     not using CPAP- lost 100lbs  . Claustrophobia   . Diabetes mellitus     Type II  . Diabetic peripheral neuropathy ([place])   . GERD (gastroesophageal reflux disease)   . Sepsis ([place])     Postive blood cultures -   . Endocarditis 11/28/14  . Complication of anesthesia     "they said something at [facility] it was affected by sleep aqpnea."  . Chronic  kidney disease     Kidney Transplant 2009 09/26/15- transplanated kidney failing  . Chronic renal allograft nephropathy   . Chronic kidney disease (CKD), stage IV (severe) ([place])   . DVT (deep venous thrombosis) ([place]) 10/07/2015    LLE     Past Surgical History  Procedure Laterality Date  . Kidney transplant  2009  . Cataract extraction w/ intraocular lens  implant, bilateral Bilateral   . Av fistula placement Bilateral 2015  . Arteriovenous graft placement Bilateral "several"  . Cystoscopy w/ internal urethrotomy  09/2014  . Bascilic vein transposition Right 09/27/2015  . Bascilic vein transposition Right 09/27/2015    Procedure: BASILIC VEIN  TRANSPOSITION right;  Surgeon: Mal Misty, MD;  Location: [place];  Service: Vascular;  Laterality: Right;      Prior to Admission medications   Medication Sig Start Date End Date Taking? Authorizing Provider  calcium acetate (PHOSLO) 667 MG capsule Take 3 capsules (2,001 mg total) by mouth 3 (three) times daily with meals. 10/26/15  Yes Shanker Kristeen Mans, MD  clonazePAM (KLONOPIN) 0.5 MG tablet Take 1 mg by mouth at bedtime.    Yes Historical Provider, MD  cloNIDine (CATAPRES) 0.3 MG tablet Take 1 tablet (0.3 mg total) by mouth 3 (three) times daily. 11/11/15  Yes Hoyt Koch, MD  Febuxostat (ULORIC) 80 MG TABS Take 80 mg by mouth daily.   Yes Historical Provider, MD  insulin regular human CONCENTRATED (HUMULIN R U-500 KWIKPEN) 500 UNIT/ML kwikpen Inject into the skin. 10 every morning, 25 units every afternoon, and 20 in the evening   Yes Historical Provider, MD  labetalol (NORMODYNE) 100 MG tablet Take 1 tablet (100 mg total) by mouth 3 (three) times daily. 11/11/15  Yes Hoyt Koch, MD  omeprazole (PRILOSEC) 20 MG capsule Take 20 mg by mouth daily.   Yes Historical Provider, MD  predniSONE (DELTASONE) 5 MG tablet Take 5 mg by mouth daily.     Yes Historical Provider, MD  rosuvastatin (CRESTOR) 20 MG tablet Take 1 tablet (20 mg total) by mouth at bedtime. 04/11/15  Yes Hoyt Koch, MD  tacrolimus (PROGRAF) 1 MG capsule Take 1 capsule (1 mg total) by mouth 2 (two) times daily. 10/26/15  Yes Shanker Kristeen Mans, MD  tadalafil (CIALIS) 5 MG tablet TAKE 1 TABLET EVERY DAY 10/23/15  Yes Historical Provider, MD  warfarin (COUMADIN) 5 MG tablet Take 1 tablet (5 mg total) by mouth daily at 6 PM. Starting 10/26/14 10/27/15  Yes Shanker Kristeen Mans, MD  ACCU-CHEK FASTCLIX LANCETS MISC Use to check blood sugar 4 times per day dx code E11.9 04/09/15   Elayne Snare, MD  fluconazole (DIFLUCAN) 50 MG tablet Take 50 mg by mouth every Monday, Wednesday, and Friday.     Historical Provider, MD    glucose blood (ACCU-CHEK SMARTVIEW) test strip Use as instructed to check blood sugar 4 times per day dx code E11.9 06/25/15   Elayne Snare, MD  insulin aspart (NOVOLOG FLEXPEN) 100 UNIT/ML FlexPen Inject 6 Units into the skin 3 (three) times daily with meals. Patient not taking: Reported on 11/15/2015 10/26/15   Jonetta Osgood, MD  Insulin Glargine (LANTUS) 100 UNIT/ML Solostar Pen Inject 35 Units into the skin daily at 10 pm. Patient not taking: Reported on 11/15/2015 10/26/15   Jonetta Osgood, MD  Insulin Pen Needle 32G X 8 MM MISC Use as directed 10/26/15   Jonetta Osgood, MD     Allergies  Allergen Reactions  .  Pork-Derived Products Anaphylaxis and Other (See Comments)    Fever also   . Ace Inhibitors Cough  . Hydrocodone Other (See Comments)    Hallucinations    Social History:  reports that he has never smoked. He has never used smokeless tobacco. He reports that he does not drink alcohol or use illicit drugs.    Family History  Problem Relation Age of Onset  . Hypertension Mother   . Diabetes Father        Physical Exam:  GEN:  Pleasant Obese 50 y.o. Arabic male examined and in no acute distress; cooperative with exam Filed Vitals:   11/15/15 1845 11/15/15 1903 11/15/15 1930 11/15/15 2015  BP: 174/71 171/88 199/54 200/54  Pulse: 104 106 108 107  Temp:      TempSrc:      Resp: 27 21 20 22   Height:      Weight:      SpO2: 92% 94% 92% 92%   Blood pressure 200/54, pulse 107, temperature 97.7 F (36.5 C), temperature source Oral, resp. rate 22, height 6' (1.829 m), weight 122.5 kg (270 lb 1 oz), SpO2 92 %. PSYCH: He is alert and oriented x4; does not appear anxious does not appear depressed; affect is normal HEENT: Normocephalic and Atraumatic, Mucous membranes pink; PERRLA; EOM intact; Fundi:  Benign;  No scleral icterus, Nares: Patent, Oropharynx: Clear, Fair Dentition,    Neck:  FROM, No Cervical Lymphadenopathy nor Thyromegaly or Carotid Bruit; No JVD; Breasts::  Not examined CHEST WALL: No tenderness CHEST: Normal respiration, clear to auscultation bilaterally HEART: Regular rate and rhythm; no murmurs rubs or gallops BACK: No kyphosis or scoliosis; No CVA tenderness ABDOMEN: Positive Bowel Sounds, Obese, Soft Non-Tender, No Rebound or Guarding; No Masses, No Organomegaly Rectal Exam: Not done EXTREMITIES: No Cyanosis, Clubbing, or Edema; No Ulcerations. Genitalia: not examined PULSES: 2+ and symmetric SKIN: Normal hydration no rash or ulceration CNS:  Alert and Oriented x 4, No Focal Deficits Vascular: pulses palpable throughout    Labs on Admission:  Basic Metabolic Panel:  Recent Labs Lab 11/15/15 1845  NA 139  K 4.7  CL 101  CO2 27  GLUCOSE 300*  BUN 23*  CREATININE 3.59*  CALCIUM 8.3*   Liver Function Tests: No results for input(s): AST, ALT, ALKPHOS, BILITOT, PROT, ALBUMIN in the last 168 hours. No results for input(s): LIPASE, AMYLASE in the last 168 hours. No results for input(s): AMMONIA in the last 168 hours. CBC:  Recent Labs Lab 11/15/15 1845  WBC 6.3  NEUTROABS 4.7  HGB 10.3*  HCT 34.6*  MCV 92.0  PLT 169   Cardiac Enzymes:  Recent Labs Lab 11/15/15 1845  TROPONINI 0.08*    BNP (last 3 results)  Recent Labs  10/07/15 1145 11/15/15 1845  BNP 1430.7* 2326.4*    ProBNP (last 3 results) No results for input(s): PROBNP in the last 8760 hours.  CBG:  Recent Labs Lab 11/15/15 1928  GLUCAP 247*    Radiological Exams on Admission: Dg Chest 2 View  11/15/2015  CLINICAL DATA:  Fever and shortness of Breath EXAM: CHEST  2 VIEW COMPARISON:  10/17/2015 FINDINGS: Cardiac shadow is mildly enlarged. A right-sided dialysis catheter is noted in satisfactory position. The lungs are well aerated bilaterally. Mild central congestion is again seen with mild interstitial edema. No focal infiltrate or sizable effusion is noted. IMPRESSION: Mild CHF.  No focal confluent infiltrate is seen. Electronically Signed    By: Inez Catalina M.D.   On:  11/15/2015 16:11     EKG: Independently reviewed. Sinus Tachycardia Rate =115, with +RBBB and LAFB     Assessment/Plan:     50 y.o. male with  Active Problems:   1.    Cough with hemoptysis- due to URI and Cough while on Coumadin, Less likely Pulmonary Embolism or Infarct   V/Q Scan in AM to evaluate for PE/Pulmonary Infarct   Tussionex BID PRN Cough   Monitor H/Hs        2.   SOB-    PRN NCO2   Monitor O2 Sats     2.    Chronic deep vein thrombosis (DVT) of left lower extremity (HCC)   On Coumadin Rx    Continuing for now with plan to discontinue if Significant Hb decrease     3.    Acute on chronic congestive heart failure (HCC)   `Dialysis Notified to continue Dialysis Schedule     4.    ESRD on dialysis [facility])   Dialysis Notified to continue Dialysis Schedule   Continue Phos   Hx of Failed Renal Transplant and being weaned from his Tacrolimus Rx     By his Renal Dr       5.    Anticoagulated on Coumadin   Monitor PT/INR daily   Continue Coumadin for now unless significant decrease in H/H     6.    Essential hypertension   Continue Labetalol and Clonidine Rx   Monitor BPs       7.    Diabetes mellitus, type 2 (HCC)   Continue Lantus Insulin 10 units SQ qhs   SSI coverage PRN     8.    DVT Prophylaxis   On Coumadin    Code Status:     FULL CODE    Family Communication:    No Family Present    Disposition Plan:    Inpatient  Status        Time spent:  [place] [facility] Pager [phone]   If [place] Please Contact the Day Rounding Team MD for [facility]  If 7PM-7AM, Please Contact Night-Floor Coverage  [email or web address] Password [facility] 11/15/2015, 8:53 PM     ADDENDUM:   Patient was seen and examined on 11/15/2015

## 2015-11-15 NOTE — Telephone Encounter (Signed)
Pt to is scheduled to see Lorane Gell, FNP today at 4:15.

## 2015-11-15 NOTE — Progress Notes (Signed)
I have reviewed and agree with the plan. 

## 2015-11-16 ENCOUNTER — Inpatient Hospital Stay (HOSPITAL_COMMUNITY): Payer: Medicare Other

## 2015-11-16 ENCOUNTER — Encounter (HOSPITAL_COMMUNITY): Payer: Self-pay

## 2015-11-16 DIAGNOSIS — Z7901 Long term (current) use of anticoagulants: Secondary | ICD-10-CM

## 2015-11-16 DIAGNOSIS — Z5181 Encounter for therapeutic drug level monitoring: Secondary | ICD-10-CM

## 2015-11-16 LAB — RENAL FUNCTION PANEL
Albumin: 2.9 g/dL — ABNORMAL LOW (ref 3.5–5.0)
Anion gap: 13 (ref 5–15)
BUN: 27 mg/dL — ABNORMAL HIGH (ref 6–20)
CO2: 27 mmol/L (ref 22–32)
Calcium: 8.2 mg/dL — ABNORMAL LOW (ref 8.9–10.3)
Chloride: 103 mmol/L (ref 101–111)
Creatinine, Ser: 3.86 mg/dL — ABNORMAL HIGH (ref 0.61–1.24)
GFR calc Af Amer: 19 mL/min — ABNORMAL LOW (ref >60)
GFR calc non Af Amer: 17 mL/min — ABNORMAL LOW (ref >60)
Glucose, Bld: 195 mg/dL — ABNORMAL HIGH (ref 65–99)
Phosphorus: 3.5 mg/dL (ref 2.5–4.6)
Potassium: 4.3 mmol/L (ref 3.5–5.1)
Sodium: 143 mmol/L (ref 135–145)

## 2015-11-16 LAB — CBC
HCT: 33 % — ABNORMAL LOW (ref 39.0–52.0)
HCT: 32.1 % — ABNORMAL LOW (ref 39.0–52.0)
Hemoglobin: 9.6 g/dL — ABNORMAL LOW (ref 13.0–17.0)
Hemoglobin: 9.4 g/dL — ABNORMAL LOW (ref 13.0–17.0)
MCH: 27 pg (ref 26.0–34.0)
MCH: 27.3 pg (ref 26.0–34.0)
MCHC: 29.1 g/dL — ABNORMAL LOW (ref 30.0–36.0)
MCHC: 29.3 g/dL — ABNORMAL LOW (ref 30.0–36.0)
MCV: 93 fL (ref 78.0–100.0)
MCV: 93.3 fL (ref 78.0–100.0)
Platelets: 155 10*3/uL (ref 150–400)
Platelets: 139 10*3/uL — ABNORMAL LOW (ref 150–400)
RBC: 3.55 MIL/uL — ABNORMAL LOW (ref 4.22–5.81)
RBC: 3.44 MIL/uL — ABNORMAL LOW (ref 4.22–5.81)
RDW: 14.7 % (ref 11.5–15.5)
RDW: 14.8 % (ref 11.5–15.5)
WBC: 4.2 10*3/uL (ref 4.0–10.5)
WBC: 3.8 10*3/uL — ABNORMAL LOW (ref 4.0–10.5)

## 2015-11-16 LAB — BASIC METABOLIC PANEL
Anion gap: 10 (ref 5–15)
BUN: 26 mg/dL — ABNORMAL HIGH (ref 6–20)
CO2: 29 mmol/L (ref 22–32)
Calcium: 8.2 mg/dL — ABNORMAL LOW (ref 8.9–10.3)
Chloride: 102 mmol/L (ref 101–111)
Creatinine, Ser: 3.97 mg/dL — ABNORMAL HIGH (ref 0.61–1.24)
GFR calc Af Amer: 19 mL/min — ABNORMAL LOW (ref >60)
GFR calc non Af Amer: 16 mL/min — ABNORMAL LOW (ref >60)
Glucose, Bld: 230 mg/dL — ABNORMAL HIGH (ref 65–99)
Potassium: 4.4 mmol/L (ref 3.5–5.1)
Sodium: 141 mmol/L (ref 135–145)

## 2015-11-16 LAB — GLUCOSE, CAPILLARY
Glucose-Capillary: 157 mg/dL — ABNORMAL HIGH (ref 65–99)
Glucose-Capillary: 147 mg/dL — ABNORMAL HIGH (ref 65–99)
Glucose-Capillary: 115 mg/dL — ABNORMAL HIGH (ref 65–99)
Glucose-Capillary: 224 mg/dL — ABNORMAL HIGH (ref 65–99)

## 2015-11-16 LAB — PROTIME-INR
INR: 2.41 — ABNORMAL HIGH (ref 0.00–1.49)
Prothrombin Time: 26 seconds — ABNORMAL HIGH (ref 11.6–15.2)

## 2015-11-16 LAB — TROPONIN I
Troponin I: 0.07 ng/mL — ABNORMAL HIGH (ref <0.031)
Troponin I: 0.07 ng/mL — ABNORMAL HIGH (ref <0.031)
Troponin I: 0.07 ng/mL — ABNORMAL HIGH (ref <0.031)

## 2015-11-16 LAB — MRSA PCR SCREENING: MRSA by PCR: NEGATIVE

## 2015-11-16 MED ORDER — LIDOCAINE HCL (PF) 1 % IJ SOLN: 5.0000 mL | INTRAMUSCULAR | Status: DC | PRN

## 2015-11-16 MED ORDER — CALCIUM ACETATE (PHOS BINDER) 667 MG PO CAPS
1334.0000 mg | ORAL_CAPSULE | Freq: Three times a day (TID) | ORAL | Status: DC
  Administered 2015-11-16 – 2015-11-17 (×4): 1334 mg via ORAL
  Filled 2015-11-16 (×4): qty 2

## 2015-11-16 MED ORDER — RENA-VITE PO TABS
1.0000 | ORAL_TABLET | Freq: Every day | ORAL | Status: DC
  Administered 2015-11-16: 1 via ORAL
  Filled 2015-11-16: qty 1

## 2015-11-16 MED ORDER — SODIUM CHLORIDE 0.9 % IV SOLN: 100.0000 mL | INTRAVENOUS | Status: DC | PRN

## 2015-11-16 MED ORDER — LIDOCAINE-PRILOCAINE 2.5-2.5 % EX CREA
1.0000 "application " | TOPICAL_CREAM | CUTANEOUS | Status: DC | PRN
  Filled 2015-11-16: qty 5

## 2015-11-16 MED ORDER — TACROLIMUS 1 MG PO CAPS
1.0000 mg | ORAL_CAPSULE | Freq: Every day | ORAL | Status: DC
  Administered 2015-11-17: 1 mg via ORAL
  Filled 2015-11-16: qty 1

## 2015-11-16 MED ORDER — ALTEPLASE 2 MG IJ SOLR
2.0000 mg | Freq: Once | INTRAMUSCULAR | Status: DC | PRN
  Filled 2015-11-16: qty 2

## 2015-11-16 MED ORDER — TECHNETIUM TC 99M DIETHYLENETRIAME-PENTAACETIC ACID
30.1000 | Freq: Once | INTRAVENOUS | Status: DC | PRN
Start: 2015-11-16 — End: 2015-11-17

## 2015-11-16 MED ORDER — PENTAFLUOROPROP-TETRAFLUOROETH EX AERO: 1.0000 "application " | INHALATION_SPRAY | CUTANEOUS | Status: DC | PRN

## 2015-11-16 MED ORDER — DOXERCALCIFEROL 4 MCG/2ML IV SOLN
2.0000 ug | INTRAVENOUS | Status: DC
  Filled 2015-11-16: qty 2

## 2015-11-16 MED ORDER — TECHNETIUM TO 99M ALBUMIN AGGREGATED
4.2000 | Freq: Once | INTRAVENOUS | Status: AC | PRN
  Administered 2015-11-16: 4 via INTRAVENOUS

## 2015-11-16 MED ORDER — WARFARIN SODIUM 5 MG PO TABS
5.0000 mg | ORAL_TABLET | Freq: Once | ORAL | Status: AC
  Administered 2015-11-16: 5 mg via ORAL
  Filled 2015-11-16: qty 1

## 2015-11-16 MED ORDER — DOXYCYCLINE HYCLATE 100 MG PO TABS
100.0000 mg | ORAL_TABLET | Freq: Two times a day (BID) | ORAL | Status: DC
  Administered 2015-11-16 – 2015-11-17 (×3): 100 mg via ORAL
  Filled 2015-11-16 (×3): qty 1

## 2015-11-16 NOTE — Procedures (Signed)
Patient seen on Hemodialysis. QB 400, UF goal 3L Treatment adjusted as needed.  Elmarie Shiley MD [facility]. Office # [phone] Pager # [phone] 2:02 PM

## 2015-11-16 NOTE — Progress Notes (Addendum)
ANTICOAGULATION CONSULT NOTE - Follow Up Consult  Pharmacy Consult for warfarin Indication: DVT (recent history 10/07/15)   Allergies  Allergen Reactions  . Pork-Derived Products Anaphylaxis and Other (See Comments)    Fever also   . Ace Inhibitors Cough  . Hydrocodone Other (See Comments)    Hallucinations    Patient Measurements: Height: 6' (182.9 cm) Weight: 262 lb 6.4 oz (119.024 kg) IBW/kg (Calculated) : 77.6  Vital Signs: Temp: 98.2 F (36.8 C) (02/04 0748) Temp Source: Oral (02/04 0748) BP: 123/55 mmHg (02/04 0748) Pulse Rate: 88 (02/04 0748)  Labs:  Recent Labs  11/15/15 11/15/15 1845 11/16/15 0506  HGB  --  10.3* 9.6*  HCT  --  34.6* 33.0*  PLT  --  169 155  LABPROT  --  24.4* 26.0*  INR 2.2 2.22* 2.41*  CREATININE  --  3.59* 3.97*  TROPONINI  --  0.08*  --     Estimated Creatinine Clearance: 29.3 mL/min (by C-G formula based on Cr of 3.97).   Assessment: 50 yo M on warfarin PTA for recent DVT diagnosis (at that time VQ was negative for PE). He was discharged 10/26/15 and dosing well until 2d ago when he developed SOB and coughing, unable to lay flat to rest. Pt also reports small amounts of hemoptysis.  INR is therapeutic this morning at 2.41. No reports of further hemoptysis, Hb down to 9.6 - will watch, platelets are low normal.  PTA: 5 mg daily except 2.5 mg Mon/Fri  Goal of Therapy:  INR 2-3 Monitor platelets by anticoagulation protocol: Yes   Plan:  Warfarin 5 mg PO tonight Daily INR Monitor for s/sx of bleeding F/U VQ scan results  [facility], Pharm.D., BCPS Clinical Pharmacist Pager: [phone] 11/16/2015 8:04 AM   Addendum: Consulted to begin Levaquin orally for CAP, bronchitis. QTc is prolonged so spoke with Dr. Candiss Norse and will change to doxycycline. He is afebrile and WBC are normal. ESRD on HD.  Doxycycline 100 mg PO bid Consider stop date of 5-7 days Pharmacy signing off of abx consult  [facility] Pharm.D.,  BCPS Clinical Pharmacist Pager: [phone] 11/16/2015 11:31 AM

## 2015-11-16 NOTE — Consult Note (Signed)
[facility] Renal Consultation Note    Indication for Consultation:  Management of ESRD/hemodialysis; anemia, hypertension/volume and secondary hyperparathyroidism PCP: Hoyt Koch, MD   HPI: [name] [name] is a 50 y.o. male with ESRD S/P renal transplant failure on hemodialysis TTS at [facility]. Past medical history significant for renal transplant (04/05/14) with progressive CKD4 and return to hemodialysis 10/19/15, continuing immunosuppressant therapy (prednisone and tracrolimus),hypertension, DMT2 S/P LLE DVT on coumadin, aortic insufficiency, OSA, hypertension, obesity, endocarditis, secondary hyperparathyroidism, anemia of chronic disease.   Patient reports cough with yellow sputum, temperature of 102 per [place], presented to ED for evaluation. Had episode of hemoptysis in ED. Temperature on arrival to ED 98.8,  CXR revealed no infiltrates, mild CHF, BNP 2326, Troponin low positive 0.7 and 0.8, Na 139 K+ 4.9. INR 2.22. HGB 10.3 Plt 169. He has been admitted for evaluation of URI R/O PE. Is scheduled for VQ scan today. Patient is currently sitting up in bed on RA without C/O SOB. Denies chest pain, no fevers past 24 hours, denies abdominal pain, flank pain, nausea, vomiting, diarrhea, C/O some DOE, productive cough.  He has been attending HD at [facility] TTS. EDW was recently lowered from 128 kg to 123 kg. BP fairly controlled at end of treatment. HGB 9.4 (11/14/15) Ca 8.1 C Ca 8.7 (11/14/15) PTH 1139 (10/29/15). He takes calcium acetate binders and received hectoral with HD for PTH suppression.   Past Medical History  Diagnosis Date  . Hypertension   . Leg pain 02/05/11    with walking  . Weight gain   . Shortness of breath     when lying flat  . Diminished eyesight   . Hearing difficulty   . Anemia   . Nervousness(799.21)   . COPD (chronic obstructive pulmonary disease) ([place])   . CHF (congestive heart failure) ([place])   . Posttraumatic stress disorder   . Allergic  rhinitis   . Dyslipidemia   . Morbid obesity ([place])   . Gait disorder   . Bilateral carpal tunnel syndrome   . Gastroparesis   . Gout   . Diabetic retinopathy ([place])   . Renal transplant, status post 04/05/2014  . Incomplete bladder emptying   . Urethral stricture   . Aortic insufficiency     a. Echo 7/16:  Mod LVH, EF 50-55%, mild to mod AI, severe LAE, PASP 57 mmHg (LV ID end diastolic [id number] mm)  . Hyperlipidemia   . Osteomyelitis ([place])   . Endocarditis   . Vitamin D deficiency   . Secondary hyperparathyroidism ([place])   . Rotator cuff disorder   . Discitis of lumbar region     resolved  . Obstructive sleep apnea     not using CPAP- lost 100lbs  . Claustrophobia   . Diabetes mellitus     Type II  . Diabetic peripheral neuropathy ([place])   . GERD (gastroesophageal reflux disease)   . Sepsis ([place])     Postive blood cultures -   . Endocarditis 11/28/14  . Complication of anesthesia     "they said something at [facility] it was affected by sleep aqpnea."  . Chronic kidney disease     Kidney Transplant 2009 09/26/15- transplanated kidney failing  . Chronic renal allograft nephropathy   . Chronic kidney disease (CKD), stage IV (severe) ([place])   . DVT (deep venous thrombosis) ([place]) 10/07/2015    LLE   Past Surgical History  Procedure Laterality Date  . Kidney transplant  2009  . Cataract  extraction w/ intraocular lens  implant, bilateral Bilateral   . Av fistula placement Bilateral 2015  . Arteriovenous graft placement Bilateral "several"  . Cystoscopy w/ internal urethrotomy  09/2014  . Bascilic vein transposition Right 09/27/2015  . Bascilic vein transposition Right 09/27/2015    Procedure: BASILIC VEIN TRANSPOSITION right;  Surgeon: Mal Misty, MD;  Location: [facility];  Service: Vascular;  Laterality: Right;   Family History  Problem Relation Age of Onset  . Hypertension Mother   . Diabetes Father    Social History:  reports that he has never smoked. He has never used  smokeless tobacco. He reports that he does not drink alcohol or use illicit drugs. Allergies  Allergen Reactions  . Pork-Derived Products Anaphylaxis and Other (See Comments)    Fever also   . Ace Inhibitors Cough  . Hydrocodone Other (See Comments)    Hallucinations   Prior to Admission medications   Medication Sig Start Date End Date Taking? Authorizing Provider  calcium acetate (PHOSLO) 667 MG capsule Take 3 capsules (2,001 mg total) by mouth 3 (three) times daily with meals. 10/26/15  Yes Shanker Kristeen Mans, MD  clonazePAM (KLONOPIN) 0.5 MG tablet Take 1 mg by mouth at bedtime.    Yes Historical Provider, MD  cloNIDine (CATAPRES) 0.3 MG tablet Take 1 tablet (0.3 mg total) by mouth 3 (three) times daily. 11/11/15  Yes Hoyt Koch, MD  Febuxostat (ULORIC) 80 MG TABS Take 80 mg by mouth daily.   Yes Historical Provider, MD  insulin aspart (NOVOLOG FLEXPEN) 100 UNIT/ML FlexPen Inject 6 Units into the skin 3 (three) times daily with meals. 10/26/15  Yes Shanker Kristeen Mans, MD  Insulin Glargine (LANTUS) 100 UNIT/ML Solostar Pen Inject 35 Units into the skin daily at 10 pm. 10/26/15  Yes Shanker Kristeen Mans, MD  labetalol (NORMODYNE) 100 MG tablet Take 1 tablet (100 mg total) by mouth 3 (three) times daily. 11/11/15  Yes Hoyt Koch, MD  omeprazole (PRILOSEC) 20 MG capsule Take 20 mg by mouth daily.   Yes Historical Provider, MD  predniSONE (DELTASONE) 5 MG tablet Take 5 mg by mouth daily.     Yes Historical Provider, MD  rosuvastatin (CRESTOR) 20 MG tablet Take 1 tablet (20 mg total) by mouth at bedtime. 04/11/15  Yes Hoyt Koch, MD  tacrolimus (PROGRAF) 1 MG capsule Take 1 capsule (1 mg total) by mouth 2 (two) times daily. 10/26/15  Yes Shanker Kristeen Mans, MD  tadalafil (CIALIS) 5 MG tablet TAKE 1 TABLET EVERY DAY 10/23/15  Yes Historical Provider, MD  warfarin (COUMADIN) 5 MG tablet Take 1 tablet (5 mg total) by mouth daily at 6 PM. Starting 10/26/14 Patient taking differently:  Take 2.5-5 mg by mouth daily at 6 PM. Take 1/2 tablet (2.5mg  on Monday and Friday, whole tablet (5mg ) other days. 10/27/15  Yes Shanker Kristeen Mans, MD  ACCU-CHEK FASTCLIX LANCETS MISC Use to check blood sugar 4 times per day dx code E11.9 04/09/15   Elayne Snare, MD  fluconazole (DIFLUCAN) 50 MG tablet Take 50 mg by mouth every Monday, Wednesday, and Friday.     Historical Provider, MD  glucose blood (ACCU-CHEK SMARTVIEW) test strip Use as instructed to check blood sugar 4 times per day dx code E11.9 06/25/15   Elayne Snare, MD  Insulin Pen Needle 32G X 8 MM MISC Use as directed 10/26/15   Jonetta Osgood, MD   Current Facility-Administered Medications  Medication Dose Route Frequency Provider Last Rate Last  Dose  . 0.9 %  sodium chloride infusion  250 mL Intravenous PRN Theressa Millard, MD      . acetaminophen (TYLENOL) tablet 650 mg  650 mg Oral Q6H PRN Theressa Millard, MD       Or  . acetaminophen (TYLENOL) suppository 650 mg  650 mg Rectal Q6H PRN Theressa Millard, MD      . calcium acetate (PHOSLO) capsule 1,334 mg  1,334 mg Oral TID WC Valentina Gu, NP      . chlorpheniramine-HYDROcodone (TUSSIONEX) 10-8 MG/5ML suspension 5 mL  5 mL Oral Q12H PRN Theressa Millard, MD   5 mL at 11/15/15 2256  . clonazePAM (KLONOPIN) tablet 1 mg  1 mg Oral QHS Theressa Millard, MD   1 mg at 11/15/15 2256  . cloNIDine (CATAPRES) tablet 0.3 mg  0.3 mg Oral TID Theressa Millard, MD   0.3 mg at 11/15/15 2256  . febuxostat (ULORIC) tablet 80 mg  80 mg Oral Daily Harvette Evonnie Dawes, MD      . HYDROmorphone (DILAUDID) injection 0.5-1 mg  0.5-1 mg Intravenous Q3H PRN Theressa Millard, MD      . insulin aspart (novoLOG) injection 0-20 Units  0-20 Units Subcutaneous TID WC Leo Grosser, MD   4 Units at 11/16/15 [phone]  . insulin aspart (novoLOG) injection 0-5 Units  0-5 Units Subcutaneous QHS Leo Grosser, MD   2 Units at 11/15/15 2257  . insulin glargine (LANTUS) injection 10 Units  10 Units Subcutaneous  QHS Theressa Millard, MD   10 Units at 11/15/15 2327  . labetalol (NORMODYNE) tablet 100 mg  100 mg Oral TID Theressa Millard, MD   100 mg at 11/15/15 2256  . ondansetron (ZOFRAN) tablet 4 mg  4 mg Oral Q6H PRN Theressa Millard, MD       Or  . ondansetron (ZOFRAN) injection 4 mg  4 mg Intravenous Q6H PRN Harvette Evonnie Dawes, MD      . oxyCODONE (Oxy IR/ROXICODONE) immediate release tablet 5 mg  5 mg Oral Q4H PRN Theressa Millard, MD      . pantoprazole (PROTONIX) EC tablet 40 mg  40 mg Oral Q1200 Theressa Millard, MD      . predniSONE (DELTASONE) tablet 5 mg  5 mg Oral Daily Theressa Millard, MD   5 mg at 11/15/15 2304  . rosuvastatin (CRESTOR) tablet 20 mg  20 mg Oral QHS Theressa Millard, MD   20 mg at 11/15/15 2256  . sodium chloride flush (NS) 0.9 % injection 3 mL  3 mL Intravenous Q12H Theressa Millard, MD   3 mL at 11/15/15 2303  . sodium chloride flush (NS) 0.9 % injection 3 mL  3 mL Intravenous PRN Theressa Millard, MD      . Derrill Memo ON 11/17/2015] tacrolimus (PROGRAF) capsule 1 mg  1 mg Oral Daily Valentina Gu, NP      . warfarin (COUMADIN) tablet 5 mg  5 mg Oral [place], [facility]      . Warfarin - Pharmacist Dosing Inpatient   Does not apply q[address] [place], [facility]       Labs: Basic Metabolic Panel:  Recent Labs Lab 11/15/15 1845 11/16/15 0506  NA 139 141  K 4.7 4.4  CL 101 102  CO2 27 29  GLUCOSE 300* 230*  BUN 23* 26*  CREATININE 3.59* 3.97*  CALCIUM 8.3* 8.2*   Liver Function Tests: No  results for input(s): AST, ALT, ALKPHOS, BILITOT, PROT, ALBUMIN in the last 168 hours. No results for input(s): LIPASE, AMYLASE in the last 168 hours. No results for input(s): AMMONIA in the last 168 hours. CBC:  Recent Labs Lab 11/15/15 1845 11/16/15 0506  WBC 6.3 4.2  NEUTROABS 4.7  --   HGB 10.3* 9.6*  HCT 34.6* 33.0*  MCV 92.0 93.0  PLT 169 155   Cardiac Enzymes:  Recent Labs Lab 11/15/15 1845 11/16/15 0700  TROPONINI 0.08*  0.07*   CBG:  Recent Labs Lab 11/15/15 1928 11/15/15 2118 11/16/15 0747  GLUCAP 247* 236* 157*   Iron Studies: No results for input(s): IRON, TIBC, TRANSFERRIN, FERRITIN in the last 72 hours. Studies/Results: Dg Chest 2 View  11/15/2015  CLINICAL DATA:  Fever and shortness of Breath EXAM: CHEST  2 VIEW COMPARISON:  10/17/2015 FINDINGS: Cardiac shadow is mildly enlarged. A right-sided dialysis catheter is noted in satisfactory position. The lungs are well aerated bilaterally. Mild central congestion is again seen with mild interstitial edema. No focal infiltrate or sizable effusion is noted. IMPRESSION: Mild CHF.  No focal confluent infiltrate is seen. Electronically Signed   By: Inez Catalina M.D.   On: 11/15/2015 16:11    ROS: As per HPI otherwise negative.   Physical Exam: Filed Vitals:   11/15/15 2015 11/15/15 2109 11/16/15 0455 11/16/15 0748  BP: 200/54 168/58 149/51 123/55  Pulse: 107 113 88 88  Temp:  98.8 F (37.1 C) 98.5 F (36.9 C) 98.2 F (36.8 C)  TempSrc:  Oral Oral Oral  Resp: 22 18 16 18   Height:  6' (1.829 m)    Weight:  119.024 kg (262 lb 6.4 oz)    SpO2: 92% 94% 100% 92%     General: Well developed, well nourished, in no acute distress. Head: Normocephalic, atraumatic, sclera non-icteric, mucus membranes are moist Neck: Supple. JVD not elevated. Lungs: Clear bilaterally to auscultation without wheezes, rales, or rhonchi. Breathing is unlabored. Heart: RRR with S1 S2. ST/SR freq pacs. No M/G/R Abdomen: Soft, non-tender, non-distended with normoactive bowel sounds. No rebound/guarding. No obvious abdominal masses. M-S:  Strength and tone appear normal for age. Lower extremities:without edema or ischemic changes, no open wounds LLE > RLE.  Neuro: Alert and oriented X 3. Moves all extremities spontaneously. Psych:  Responds to questions appropriately with a normal affect. Dialysis Access: New RUA AVF maturing + bruit/+ thrill. RCharolette Forward cath-sutures still in  place.   Dialysis Orders: Center: [facility] on TTS . 4 hrs 0 min, 180NRe Optiflux, BFR 400, DFR Manual 800 mL/min, EDW 122.5 (kg), Dialysate 3.0 K, 2.25 Ca, UFR Profile: None, Sodium Model: None, Access: RIJ Catheter-Tunneled Heparin: ALLERGIC TO PORCINE, use citrate to flush catheter Hectoral: 2 mcg IV Q TTS Venofer: 50 mg IV weekly (recently loaded with Fe-not started yet) Mircera: 150 mcg IV q 2 weeks ( last dose 11/07/15)  Assessment/Plan: 1.  URI vs PE: per primary, VQ scan today. BC pending. On doxycycine empirically.   2.  Acute on chronic congestive heart failure: Will have HD today, attempt to lower volume. Troponin +, possible demand ischemia? Per primary.  3.  ESRD -  TTS at [facility]. Will have HD on schedule. K+ 4.4. 2.0 K bath 4.  Hypertension/volume  - BP initially high now controlled. On Clonidine and Labetalol. Will attempt 2.5-3 liters UFG in HD today.  5.  Anemia  - 9.6 rec'd ESA 11/07/15. Follow HGB,  6.  Metabolic bone disease -  Ca  8.2. Continue binders, hectoral.  7.  Nutrition - Renal diet, renal vits. OP Albumin 3.3 11/14/15.  8.  Immunosuppressant therapy: continue  tracrolimus, prednisone.  tracrolimus dose recently decreased per Dr. Mercy Moore to 1mg  po daily 9.  DM: per primary 10.  H/O LLE DVT: continue coumadin, INR 2.41  Keifer Habib H. Owens Shark, NP-C 11/16/2015, 10:34 AM  [employer] [phone]

## 2015-11-16 NOTE — Progress Notes (Signed)
Patient Demographics:    [name] [name], is a 50 y.o. male, DOB - 1966/08/10, JSR:[id number]  Admit date - 11/15/2015   Admitting Physician Theressa Millard, MD  Outpatient Primary MD for the patient is Hoyt Koch, MD  LOS - 1   Chief Complaint  Patient presents with  . Shortness of Breath  . Cough        Subjective:    [name] [name] today has, No headache, No chest pain, No abdominal pain - No Nausea, No new weakness tingling or numbness, No Cough - SOB.     Assessment  & Plan :     1. Acute Bronchitis - with fevers at home, cough and mild blood-streaked sputum, ordered blood and sputum cultures, empiric oral Levaquin for now. Clinically much better.  2. Chronic DVT in the left lower extremity. Diagnosed 1 month ago. On Coumadin. VQ scan ordered upon admission pending. Clinically does not have PE.  3. Newly diagnosed ESRD. On Tuesday, Thursday and Saturday dialysis. Renal consulted.  4. Mild acute on chronic diastolic CHF. EF 55%. Fluid removal via dialysis. Renal called.  5.HTN - currently on combination of clonidine, labetalol continue and monitor.   6. GERD. On PPI continue.  7. History of failed renal transplant. Continue prednisone and tacrolimus.  8. DM type II. On Lantus and sliding scale.  Lab Results  Component Value Date   HGBA1C 7.7* 10/17/2015    CBG (last 3)   Recent Labs  11/15/15 1928 11/15/15 2118 11/16/15 0747  GLUCAP 247* 236* 157*      Code Status : Full  Family Communication  : none  Disposition Plan  : home 1-2 days  Consults  :  Renal  Procedures  :    DVT Prophylaxis  :  Couamdin  Lab Results  Component Value Date   INR 2.41* 11/16/2015   INR 2.22* 11/15/2015   INR 2.2 11/15/2015     Lab Results  Component Value Date   PLT 155 11/16/2015    Inpatient Medications  Scheduled Meds: . calcium acetate  1,334 mg Oral TID WC  . clonazePAM  1 mg Oral QHS  . cloNIDine  0.3 mg Oral TID  . febuxostat  80 mg Oral Daily  . insulin aspart  0-20 Units Subcutaneous TID WC  . insulin aspart  0-5 Units Subcutaneous QHS  . insulin glargine  10 Units Subcutaneous QHS  . labetalol  100 mg Oral TID  . pantoprazole  40 mg Oral Q1200  . predniSONE  5 mg Oral Daily  . rosuvastatin  20 mg Oral QHS  . sodium chloride flush  3 mL Intravenous Q12H  . [START ON 11/17/2015] tacrolimus  1 mg Oral Daily  . warfarin  5 mg Oral ONCE-1800  . Warfarin - Pharmacist Dosing Inpatient   Does not apply q1800   Continuous Infusions:  PRN Meds:.sodium chloride, acetaminophen **OR** acetaminophen, chlorpheniramine-HYDROcodone, HYDROmorphone (DILAUDID) injection, ondansetron **OR** ondansetron (ZOFRAN) IV, oxyCODONE, sodium chloride flush  Antibiotics  :    Anti-infectives    None        Objective:   Filed Vitals:   11/15/15 2015 11/15/15 2109 11/16/15 0455 11/16/15 0748  BP: 200/54 168/58 149/51 123/55  Pulse: 107 113 88 88  Temp:  98.8 F (37.1 C) 98.5 F (36.9 C) 98.2 F (36.8 C)  TempSrc:  Oral Oral Oral  Resp: _0 Height:  6' (1.829 m)    Weight:  119.024 kg (262 lb 6.4 oz)    SpO2: 92% 94% 100% 92%    Wt Readings from Last 3 Encounters:  11/15/15 119.024 kg (262 lb 6.4 oz)  11/11/15 124.286 kg (274 lb)  10/26/15 127.4 kg (280 lb 13.9 oz)     Intake/Output Summary (Last 24 hours) at 11/16/15 1109 Last data filed at 11/16/15 0748  Gross per 24 hour  Intake    120 ml  Output      0 ml  Net    120 ml     Physical Exam  Awake Alert, Oriented X 3, No new F.N deficits, Normal affect [place].AT,PERRAL Supple Neck,No JVD, No cervical lymphadenopathy appriciated.  Symmetrical Chest wall movement, Good air movement bilaterally, CTAB RRR,No Gallops,Rubs or new Murmurs, No Parasternal Heave +ve B.Sounds, Abd  Soft, No tenderness, No organomegaly appriciated, No rebound - guarding or rigidity. No Cyanosis, Clubbing or edema, No new Rash or bruise      Data Review:   Micro Results Recent Results (from the past 240 hour(s))  MRSA PCR Screening     Status: None   Collection Time: 11/15/15 11:21 PM  Result Value Ref Range Status   MRSA by PCR NEGATIVE NEGATIVE Final    Comment:        The GeneXpert MRSA Assay (FDA approved for NASAL specimens only), is one component of a comprehensive MRSA colonization surveillance program. It is not intended to diagnose MRSA infection nor to guide or monitor treatment for MRSA infections.   Culture, blood (routine x 2)     Status: None (Preliminary result)   Collection Time: 11/16/15  6:54 AM  Result Value Ref Range Status   Specimen Description BLOOD LEFT HAND  Final   Special Requests BOTTLES DRAWN AEROBIC ONLY 6 CCS  Final   Culture PENDING  Incomplete   Report Status PENDING  Incomplete  Culture, blood (routine x 2)     Status: None (Preliminary result)   Collection Time: 11/16/15  7:10 AM  Result Value Ref Range Status   Specimen Description BLOOD LEFT HAND  Final   Special Requests BOTTLES DRAWN AEROBIC ONLY 5.0CCS  Final   Culture PENDING  Incomplete   Report Status PENDING  Incomplete    Radiology Reports Dg Chest 2 View  11/15/2015  CLINICAL DATA:  Fever and shortness of Breath EXAM: CHEST  2 VIEW COMPARISON:  10/17/2015 FINDINGS: Cardiac shadow is mildly enlarged. A right-sided dialysis catheter is noted in satisfactory position. The lungs are well aerated bilaterally. Mild central congestion is again seen with mild interstitial edema. No focal infiltrate or sizable effusion is noted. IMPRESSION: Mild CHF.  No focal confluent infiltrate is seen. Electronically Signed   By: Inez Catalina M.D.   On: 11/15/2015 16:11   Ir Fluoro Guide Cv Line Right  10/19/2015  INDICATION: 50 year old male with chronic kidney disease requiring hemodialysis. He  recently had line sepsis and has been on a several day line holiday. He has a newly placed right upper extremity arteriovenous fistula which is still in the process of maturing. He needs durable dialysis access until his AVF is fully matured and accessible. EXAM: TUNNELED CENTRAL VENOUS HEMODIALYSIS CATHETER PLACEMENT WITH ULTRASOUND AND FLUOROSCOPIC GUIDANCE MEDICATIONS: Ancef 2 gm IV; The IV antibiotic was given in an  appropriate time interval prior to skin puncture. CONTRAST:  None ANESTHESIA/SEDATION: Versed 1.5 mg IV; Fentanyl 75 mcg IV Total Moderate Sedation Time 16 minutes. FLUOROSCOPY TIME:  36 seconds for a total of 8 mGy COMPLICATIONS: None immediate PROCEDURE: Informed written consent was obtained from the patient after a discussion of the risks, benefits, and alternatives to treatment. Questions regarding the procedure were encouraged and answered. The right neck and chest were prepped with chlorhexidine in a sterile fashion, and a sterile drape was applied covering the operative field. Maximum barrier sterile technique with sterile gowns and gloves were used for the procedure. A timeout was performed prior to the initiation of the procedure. After creating a small venotomy incision, a micropuncture kit was utilized to access the right internal jugular vein under direct, real-time ultrasound guidance after the overlying soft tissues were anesthetized with 1% lidocaine with epinephrine. Ultrasound image documentation was performed. The microwire was kinked to measure appropriate catheter length. A stiff glidewire was advanced to the level of the IVC and the micropuncture sheath was exchanged for a peel-away sheath. A Palindrome tunneled hemodialysis catheter measuring 23 cm from tip to cuff was tunneled in a retrograde fashion from the anterior chest wall to the venotomy incision. The catheter was then placed through the peel-away sheath with tips ultimately positioned within the superior aspect of the  right atrium. Final catheter positioning was confirmed and documented with a spot radiographic image. The catheter aspirates and flushes normally. The catheter was flushed with appropriate volume heparin dwells. The catheter exit site was secured with a 0-Prolene retention suture. The venotomy incision was closed with Dermabond. Dressings were applied. The patient tolerated the procedure well without immediate post procedural complication. IMPRESSION: Successful placement of 23 cm tip to cuff tunneled hemodialysis catheter via the right internal jugular vein with tips terminating within the superior aspect of the right atrium. The catheter is ready for immediate use. Signed, Criselda Peaches, MD [facility] Electronically Signed   By: Jacqulynn Cadet M.D.   On: 10/19/2015 13:01   Ir US Guide Vasc Access Right  10/19/2015  INDICATION: 50 year old male with chronic kidney disease requiring hemodialysis. He recently had line sepsis and has been on a several day line holiday. He has a newly placed right upper extremity arteriovenous fistula which is still in the process of maturing. He needs durable dialysis access until his AVF is fully matured and accessible. EXAM: TUNNELED CENTRAL VENOUS HEMODIALYSIS CATHETER PLACEMENT WITH ULTRASOUND AND FLUOROSCOPIC GUIDANCE MEDICATIONS: Ancef 2 gm IV; The IV antibiotic was given in an appropriate time interval prior to skin puncture. CONTRAST:  None ANESTHESIA/SEDATION: Versed 1.5 mg IV; Fentanyl 75 mcg IV Total Moderate Sedation Time 16 minutes. FLUOROSCOPY TIME:  36 seconds for a total of 8 mGy COMPLICATIONS: None immediate PROCEDURE: Informed written consent was obtained from the patient after a discussion of the risks, benefits, and alternatives to treatment. Questions regarding the procedure were encouraged and answered. The right neck and chest were prepped with chlorhexidine in a sterile fashion, and a  sterile drape was applied covering the operative field. Maximum barrier sterile technique with sterile gowns and gloves were used for the procedure. A timeout was performed prior to the initiation of the procedure. After creating a small venotomy incision, a micropuncture kit was utilized to access the right internal jugular vein under direct, real-time ultrasound guidance after the overlying soft tissues were anesthetized with 1% lidocaine with epinephrine. Ultrasound image documentation was performed.  The microwire was kinked to measure appropriate catheter length. A stiff glidewire was advanced to the level of the IVC and the micropuncture sheath was exchanged for a peel-away sheath. A Palindrome tunneled hemodialysis catheter measuring 23 cm from tip to cuff was tunneled in a retrograde fashion from the anterior chest wall to the venotomy incision. The catheter was then placed through the peel-away sheath with tips ultimately positioned within the superior aspect of the right atrium. Final catheter positioning was confirmed and documented with a spot radiographic image. The catheter aspirates and flushes normally. The catheter was flushed with appropriate volume heparin dwells. The catheter exit site was secured with a 0-Prolene retention suture. The venotomy incision was closed with Dermabond. Dressings were applied. The patient tolerated the procedure well without immediate post procedural complication. IMPRESSION: Successful placement of 23 cm tip to cuff tunneled hemodialysis catheter via the right internal jugular vein with tips terminating within the superior aspect of the right atrium. The catheter is ready for immediate use. Signed, Criselda Peaches, MD [facility] Electronically Signed   By: Jacqulynn Cadet M.D.   On: 10/19/2015 13:01     CBC  Recent Labs Lab 11/15/15 1845 11/16/15 0506  WBC 6.3 4.2  HGB 10.3* 9.6*  HCT 34.6*  33.0*  PLT 169 155  MCV 92.0 93.0  MCH 27.4 27.0  MCHC 29.8* 29.1*  RDW 14.5 14.7  LYMPHSABS 1.0  --   MONOABS 0.6  --   EOSABS 0.0  --   BASOSABS 0.0  --     Chemistries   Recent Labs Lab 11/15/15 1845 11/16/15 0506  NA 139 141  K 4.7 4.4  CL 101 102  CO2 27 29  GLUCOSE 300* 230*  BUN 23* 26*  CREATININE 3.59* 3.97*  CALCIUM 8.3* 8.2*   ------------------------------------------------------------------------------------------------------------------ No results for input(s): CHOL, HDL, LDLCALC, TRIG, CHOLHDL, LDLDIRECT in the last 72 hours.  Lab Results  Component Value Date   HGBA1C 7.7* 10/17/2015   ------------------------------------------------------------------------------------------------------------------ No results for input(s): TSH, T4TOTAL, T3FREE, THYROIDAB in the last 72 hours.  Invalid input(s): FREET3 ------------------------------------------------------------------------------------------------------------------ No results for input(s): VITAMINB12, FOLATE, FERRITIN, TIBC, IRON, RETICCTPCT in the last 72 hours.  Coagulation profile  Recent Labs Lab 11/15/15 11/15/15 1845 11/16/15 0506  INR 2.2 2.22* 2.41*    No results for input(s): DDIMER in the last 72 hours.  Cardiac Enzymes  Recent Labs Lab 11/15/15 1845 11/16/15 0700  TROPONINI 0.08* 0.07*   ------------------------------------------------------------------------------------------------------------------    Component Value Date/Time   BNP 2326.4* 11/15/2015 1845    Time Spent in minutes   35   SINGH,PRASHANT K M.D on 11/16/2015 at 11:09 AM  Between 7am to 7pm - Pager - [phone]  After 7pm go to [email or web address] - password [facility]  [facility] -  Office  [phone]

## 2015-11-17 LAB — CBC
HCT: 33 % — ABNORMAL LOW (ref 39.0–52.0)
Hemoglobin: 9.6 g/dL — ABNORMAL LOW (ref 13.0–17.0)
MCH: 27 pg (ref 26.0–34.0)
MCHC: 29.1 g/dL — ABNORMAL LOW (ref 30.0–36.0)
MCV: 92.7 fL (ref 78.0–100.0)
Platelets: 156 10*3/uL (ref 150–400)
RBC: 3.56 MIL/uL — ABNORMAL LOW (ref 4.22–5.81)
RDW: 14.6 % (ref 11.5–15.5)
WBC: 3.9 10*3/uL — ABNORMAL LOW (ref 4.0–10.5)

## 2015-11-17 LAB — PROTIME-INR
INR: 2.1 — ABNORMAL HIGH (ref 0.00–1.49)
Prothrombin Time: 23.4 seconds — ABNORMAL HIGH (ref 11.6–15.2)

## 2015-11-17 LAB — GLUCOSE, CAPILLARY
Glucose-Capillary: 265 mg/dL — ABNORMAL HIGH (ref 65–99)
Glucose-Capillary: 156 mg/dL — ABNORMAL HIGH (ref 65–99)

## 2015-11-17 MED ORDER — GUAIFENESIN-DM 100-10 MG/5ML PO SYRP: 5.0000 mL | ORAL_SOLUTION | ORAL | Status: DC | PRN

## 2015-11-17 MED ORDER — DOXYCYCLINE HYCLATE 100 MG PO TABS: 100.0000 mg | ORAL_TABLET | Freq: Two times a day (BID) | ORAL | Status: DC

## 2015-11-17 NOTE — Discharge Summary (Signed)
[name] [name], is a 50 y.o. male  DOB Mar 31, 1966  MRN [id number].  Admission date:  11/15/2015  Admitting Physician  Theressa Millard, MD  Discharge Date:  11/17/2015   Primary MD  Hoyt Koch, MD  Recommendations for primary care physician for things to follow:   Check CBC, BMP, INR 2 view chest x-ray in 2-3 days  Please  review final blood and sputum culture results which are still pending   Admission Diagnosis  ESRD on dialysis ([place]) [N18.6, Z99.2] Anticoagulated on Coumadin [Z51.81, Z79.01] Cough with hemoptysis [R04.2]   Discharge Diagnosis  ESRD on dialysis ([place]) [N18.6, Z99.2] Anticoagulated on Coumadin [Z51.81, Z79.01] Cough with hemoptysis [R04.2]    Active Problems:   Essential hypertension   Diabetes mellitus, type 2 (HCC)   Chronic deep vein thrombosis (DVT) of left lower extremity (HCC)   Acute on chronic congestive heart failure ([place])   ESRD on dialysis ([place])   Anticoagulated on Coumadin   Cough with hemoptysis      Past Medical History  Diagnosis Date  . Hypertension   . Leg pain 02/05/11    with walking  . Weight gain   . Shortness of breath     when lying flat  . Diminished eyesight   . Hearing difficulty   . Anemia   . Nervousness(799.21)   . COPD (chronic obstructive pulmonary disease) ([place])   . CHF (congestive heart failure) ([place])   . Posttraumatic stress disorder   . Allergic rhinitis   . Dyslipidemia   . Morbid obesity ([place])   . Gait disorder   . Bilateral carpal tunnel syndrome   . Gastroparesis   . Gout   . Diabetic retinopathy ([place])   . Renal transplant, status post 04/05/2014  . Incomplete bladder emptying   . Urethral stricture   . Aortic insufficiency     a. Echo 7/16:  Mod LVH, EF 50-55%, mild to mod AI, severe LAE, PASP 57 mmHg (LV ID end diastolic  18.5 mm)  . Hyperlipidemia   . Osteomyelitis ([place])   . Endocarditis   . Vitamin D deficiency   . Secondary hyperparathyroidism ([place])   . Rotator cuff disorder   . Discitis of lumbar region     resolved  . Obstructive sleep apnea     not using CPAP- lost 100lbs  . Claustrophobia   . Diabetes mellitus     Type II  . Diabetic peripheral neuropathy ([place])   . GERD (gastroesophageal reflux disease)   . Sepsis ([place])     Postive blood cultures -   . Endocarditis 11/28/14  . Complication of anesthesia     "they said something at [facility] it was affected by sleep aqpnea."  . Chronic kidney disease     Kidney Transplant 2009 09/26/15- transplanated kidney failing  . Chronic renal allograft nephropathy   . Chronic kidney disease (CKD), stage IV (severe) ([place])   . DVT (deep venous thrombosis) ([place]) 10/07/2015    LLE    Past Surgical History  Procedure Laterality Date  . Kidney transplant  2009  . Cataract extraction w/ intraocular lens  implant, bilateral Bilateral   . Av fistula placement Bilateral 2015  . Arteriovenous graft placement Bilateral "several"  . Cystoscopy w/ internal urethrotomy  09/2014  . Bascilic vein transposition Right 09/27/2015  . Bascilic vein transposition Right 09/27/2015    Procedure: BASILIC VEIN TRANSPOSITION right;  Surgeon: Mal Misty, MD;  Location: [facility];  Service: Vascular;  Laterality: Right;       HPI  from the history and physical done on the day of admission:    [name] [name] is a 50 y.o. male with a history of ESRD on HD, Hx of DVT on Coumadin Rx, Chronic Diastolic CHF, HTN and DM2 who presents to the ED with complaints of increased SOB and Cough over the past 48 hours. He was told he had a fever of 102 at home by his home health nurse. He reports coughing up yellowish mucus, and had an episode of hemoptysis in the ED. His PT/INR was found to be 2.2, he is on Coumadin.       Hospital Course:    1. Acute Bronchitis - with  fevers at home, cough and mild blood-streaked sputum, much improved with empiric doxycycline for one day, completely symptom-free except for mild cough he appears in no discomfort and looks nontoxic, no oxygen requirement, no leukocytosis, will place him on 5 more days of oral doxycycline and discharge. Request PCP to check CBC, BMP, INR and a 2 view chest x-ray in 2-3 days. Kindly follow final blood and sputum culture results which are pending.   2. Chronic DVT in the left lower extremity. Diagnosed 1 month ago. On Coumadin. VQ scan was done in the ER which was unremarkable.  3. Newly diagnosed ESRD. On Tuesday, Thursday and Saturday dialysis. Renal consulted. Underwent dialysis on Saturday will follow with his outpatient dialysis center unchanged as before.  4. Mild acute on chronic diastolic CHF. EF 55%. Fluid removal via dialysis. Renal called.  5.HTN - currently on combination of clonidine, labetalol continue and monitor.   6. GERD. On PPI continue.  7. History of failed renal transplant. Continue prednisone and tacrolimus.  8. DM type II. resume home regimen.     Discharge Condition: Stable  Follow UP  Follow-up Information    Follow up with Hoyt Koch, MD. Schedule an appointment as soon as possible for a visit in 2 days.   Specialty:  Internal Medicine   Contact information:   [place] [id number] [phone]        Consults obtained - Renal  Diet and Activity recommendation: See Discharge Instructions below  Discharge Instructions       Discharge Instructions    Discharge instructions    Complete by:  As directed   Follow with Primary MD Hoyt Koch, MD in 2-3 days, get final blood culture results checked next visit  Get CBC, CMP, INR, 2 view Chest X ray checked  by Primary MD next visit.    Activity: As tolerated with Full fall precautions use walker/cane & assistance as needed   Disposition Home     Diet:    Renal - Low Carb,  Check your Weight same time everyday, if you gain over 2 pounds, or you develop in leg swelling, experience more shortness of breath or chest pain, call your Primary MD immediately. Follow Cardiac Low Salt Diet and 1.2 lit/day fluid restriction.  Accuchecks  4 times/day, Once in AM empty stomach and then before each meal. Log in all results and show them to your Prim.MD in 3 days. If any glucose reading is under 80 or above 300 call your Prim MD immidiately. Follow Low glucose instructions for glucose under 80 as instructed.  On your next visit with your primary care physician please Get Medicines reviewed and adjusted.   Please request your Prim.MD to go over all Hospital Tests and Procedure/Radiological results at the follow up, please get all Hospital records sent to your Prim MD by signing hospital release before you go home.   If you experience worsening of your admission symptoms, develop shortness of breath, life threatening emergency, suicidal or homicidal thoughts you must seek medical attention immediately by calling 911 or calling your MD immediately  if symptoms less severe.  You Must read complete instructions/literature along with all the possible adverse reactions/side effects for all the Medicines you take and that have been prescribed to you. Take any new Medicines after you have completely understood and accpet all the possible adverse reactions/side effects.   Do not drive, operating heavy machinery, perform activities at heights, swimming or participation in water activities or provide baby sitting services if your were admitted for syncope or siezures until you have seen by Primary MD or a Neurologist and advised to do so again.  Do not drive when taking Pain medications.    Do not take more than prescribed Pain, Sleep and Anxiety Medications  Special Instructions: If you have smoked or chewed Tobacco  in the last 2 yrs please stop smoking, stop any  regular Alcohol  and or any Recreational drug use.  Wear Seat belts while driving.   Please note  You were cared for by a hospitalist during your hospital stay. If you have any questions about your discharge medications or the care you received while you were in the hospital after you are discharged, you can call the unit and asked to speak with the hospitalist on call if the hospitalist that took care of you is not available. Once you are discharged, your primary care physician will handle any further medical issues. Please note that NO REFILLS for any discharge medications will be authorized once you are discharged, as it is imperative that you return to your primary care physician (or establish a relationship with a primary care physician if you do not have one) for your aftercare needs so that they can reassess your need for medications and monitor your lab values.     Increase activity slowly    Complete by:  As directed              Discharge Medications       Medication List    TAKE these medications        ACCU-CHEK FASTCLIX LANCETS Misc  Use to check blood sugar 4 times per day dx code E11.9     calcium acetate 667 MG capsule  Commonly known as:  PHOSLO  Take 3 capsules (2,001 mg total) by mouth 3 (three) times daily with meals.     CIALIS 5 MG tablet  Generic drug:  tadalafil  TAKE 1 TABLET EVERY DAY     clonazePAM 0.5 MG tablet  Commonly known as:  KLONOPIN  Take 1 mg by mouth at bedtime.     cloNIDine 0.3 MG tablet  Commonly known as:  CATAPRES  Take 1 tablet (0.3 mg total) by mouth 3 (three) times daily.  doxycycline 100 MG tablet  Commonly known as:  VIBRA-TABS  Take 1 tablet (100 mg total) by mouth every 12 (twelve) hours.     fluconazole 50 MG tablet  Commonly known as:  DIFLUCAN  Take 50 mg by mouth every Monday, Wednesday, and Friday.     glucose blood test strip  Commonly known as:  ACCU-CHEK SMARTVIEW  Use as instructed to check blood sugar  4 times per day dx code E11.9     guaiFENesin-dextromethorphan 100-10 MG/5ML syrup  Commonly known as:  ROBITUSSIN DM  Take 5 mLs by mouth every 4 (four) hours as needed for cough.     insulin aspart 100 UNIT/ML FlexPen  Commonly known as:  NOVOLOG FLEXPEN  Inject 6 Units into the skin 3 (three) times daily with meals.     Insulin Glargine 100 UNIT/ML Solostar Pen  Commonly known as:  LANTUS  Inject 35 Units into the skin daily at 10 pm.     Insulin Pen Needle 32G X 8 MM Misc  Use as directed     labetalol 100 MG tablet  Commonly known as:  NORMODYNE  Take 1 tablet (100 mg total) by mouth 3 (three) times daily.     omeprazole 20 MG capsule  Commonly known as:  PRILOSEC  Take 20 mg by mouth daily.     predniSONE 5 MG tablet  Commonly known as:  DELTASONE  Take 5 mg by mouth daily.     rosuvastatin 20 MG tablet  Commonly known as:  CRESTOR  Take 1 tablet (20 mg total) by mouth at bedtime.     tacrolimus 1 MG capsule  Commonly known as:  PROGRAF  Take 1 capsule (1 mg total) by mouth 2 (two) times daily.     ULORIC 80 MG Tabs  Generic drug:  Febuxostat  Take 80 mg by mouth daily.     warfarin 5 MG tablet  Commonly known as:  COUMADIN  Take 1 tablet (5 mg total) by mouth daily at 6 PM. Starting 10/26/14        Major procedures and Radiology Reports - PLEASE review detailed and final reports for all details, in brief -       Dg Chest 2 View  11/15/2015  CLINICAL DATA:  Fever and shortness of Breath EXAM: CHEST  2 VIEW COMPARISON:  10/17/2015 FINDINGS: Cardiac shadow is mildly enlarged. A right-sided dialysis catheter is noted in satisfactory position. The lungs are well aerated bilaterally. Mild central congestion is again seen with mild interstitial edema. No focal infiltrate or sizable effusion is noted. IMPRESSION: Mild CHF.  No focal confluent infiltrate is seen. Electronically Signed   By: Inez Catalina M.D.   On: 11/15/2015 16:11   Nm Pulmonary Perf And  Vent  11/16/2015  CLINICAL DATA:  Acute bronchitis with fever at home, cough mild blood streak in spetum Hx: Chronic DVT in lower Left 10/07/15, KCD, ESRD EXAM: NUCLEAR MEDICINE VENTILATION - PERFUSION LUNG SCAN TECHNIQUE: Ventilation images were obtained in multiple projections using inhaled aerosol Tc-45mDTPA. Perfusion images were obtained in multiple projections after intravenous injection of Tc-967mAA. RADIOPHARMACEUTICALS:  30.1 Technetium-9988mPA aerosol inhalation and 4.2 Technetium-91m61m IV COMPARISON:  Radiograph from the previous day FINDINGS: Ventilation: Mildly asymmetric activity right greater than left. Perfusion: No mismatch. No wedge shaped peripheral perfusion defects to suggest acute pulmonary embolism. IMPRESSION: Low likelihood ratio for pulmonary embolism. Electronically Signed   By: D  [facility] [place].   On: 11/16/2015 14:59  Ir Fluoro Guide Cv Line Right  10/19/2015  INDICATION: 50 year old male with chronic kidney disease requiring hemodialysis. He recently had line sepsis and has been on a several day line holiday. He has a newly placed right upper extremity arteriovenous fistula which is still in the process of maturing. He needs durable dialysis access until his AVF is fully matured and accessible. EXAM: TUNNELED CENTRAL VENOUS HEMODIALYSIS CATHETER PLACEMENT WITH ULTRASOUND AND FLUOROSCOPIC GUIDANCE MEDICATIONS: Ancef 2 gm IV; The IV antibiotic was given in an appropriate time interval prior to skin puncture. CONTRAST:  None ANESTHESIA/SEDATION: Versed 1.5 mg IV; Fentanyl 75 mcg IV Total Moderate Sedation Time 16 minutes. FLUOROSCOPY TIME:  36 seconds for a total of 8 mGy COMPLICATIONS: None immediate PROCEDURE: Informed written consent was obtained from the patient after a discussion of the risks, benefits, and alternatives to treatment. Questions regarding the procedure were encouraged and answered. The right neck and chest were prepped with chlorhexidine in a sterile fashion, and  a sterile drape was applied covering the operative field. Maximum barrier sterile technique with sterile gowns and gloves were used for the procedure. A timeout was performed prior to the initiation of the procedure. After creating a small venotomy incision, a micropuncture kit was utilized to access the right internal jugular vein under direct, real-time ultrasound guidance after the overlying soft tissues were anesthetized with 1% lidocaine with epinephrine. Ultrasound image documentation was performed. The microwire was kinked to measure appropriate catheter length. A stiff glidewire was advanced to the level of the IVC and the micropuncture sheath was exchanged for a peel-away sheath. A Palindrome tunneled hemodialysis catheter measuring 23 cm from tip to cuff was tunneled in a retrograde fashion from the anterior chest wall to the venotomy incision. The catheter was then placed through the peel-away sheath with tips ultimately positioned within the superior aspect of the right atrium. Final catheter positioning was confirmed and documented with a spot radiographic image. The catheter aspirates and flushes normally. The catheter was flushed with appropriate volume heparin dwells. The catheter exit site was secured with a 0-Prolene retention suture. The venotomy incision was closed with Dermabond. Dressings were applied. The patient tolerated the procedure well without immediate post procedural complication. IMPRESSION: Successful placement of 23 cm tip to cuff tunneled hemodialysis catheter via the right internal jugular vein with tips terminating within the superior aspect of the right atrium. The catheter is ready for immediate use. Signed, Criselda Peaches, MD [facility] Radiology Electronically Signed   By: Jacqulynn Cadet M.D.   On: 10/19/2015 13:01   Ir US Guide Vasc Access Right  10/19/2015  INDICATION: 50 year old male with chronic kidney disease  requiring hemodialysis. He recently had line sepsis and has been on a several day line holiday. He has a newly placed right upper extremity arteriovenous fistula which is still in the process of maturing. He needs durable dialysis access until his AVF is fully matured and accessible. EXAM: TUNNELED CENTRAL VENOUS HEMODIALYSIS CATHETER PLACEMENT WITH ULTRASOUND AND FLUOROSCOPIC GUIDANCE MEDICATIONS: Ancef 2 gm IV; The IV antibiotic was given in an appropriate time interval prior to skin puncture. CONTRAST:  None ANESTHESIA/SEDATION: Versed 1.5 mg IV; Fentanyl 75 mcg IV Total Moderate Sedation Time 16 minutes. FLUOROSCOPY TIME:  36 seconds for a total of 8 mGy COMPLICATIONS: None immediate PROCEDURE: Informed written consent was obtained from the patient after a discussion of the risks, benefits, and alternatives to treatment. Questions regarding the procedure were encouraged and answered. The  right neck and chest were prepped with chlorhexidine in a sterile fashion, and a sterile drape was applied covering the operative field. Maximum barrier sterile technique with sterile gowns and gloves were used for the procedure. A timeout was performed prior to the initiation of the procedure. After creating a small venotomy incision, a micropuncture kit was utilized to access the right internal jugular vein under direct, real-time ultrasound guidance after the overlying soft tissues were anesthetized with 1% lidocaine with epinephrine. Ultrasound image documentation was performed. The microwire was kinked to measure appropriate catheter length. A stiff glidewire was advanced to the level of the IVC and the micropuncture sheath was exchanged for a peel-away sheath. A Palindrome tunneled hemodialysis catheter measuring 23 cm from tip to cuff was tunneled in a retrograde fashion from the anterior chest wall to the venotomy incision. The catheter was then placed through the peel-away sheath with tips ultimately positioned within  the superior aspect of the right atrium. Final catheter positioning was confirmed and documented with a spot radiographic image. The catheter aspirates and flushes normally. The catheter was flushed with appropriate volume heparin dwells. The catheter exit site was secured with a 0-Prolene retention suture. The venotomy incision was closed with Dermabond. Dressings were applied. The patient tolerated the procedure well without immediate post procedural complication. IMPRESSION: Successful placement of 23 cm tip to cuff tunneled hemodialysis catheter via the right internal jugular vein with tips terminating within the superior aspect of the right atrium. The catheter is ready for immediate use. Signed, Criselda Peaches, MD [facility] Radiology Electronically Signed   By: Jacqulynn Cadet M.D.   On: 10/19/2015 13:01    Micro Results      Recent Results (from the past 240 hour(s))  MRSA PCR Screening     Status: None   Collection Time: 11/15/15 11:21 PM  Result Value Ref Range Status   MRSA by PCR NEGATIVE NEGATIVE Final    Comment:        The GeneXpert MRSA Assay (FDA approved for NASAL specimens only), is one component of a comprehensive MRSA colonization surveillance program. It is not intended to diagnose MRSA infection nor to guide or monitor treatment for MRSA infections.   Culture, blood (routine x 2)     Status: None (Preliminary result)   Collection Time: 11/16/15  6:54 AM  Result Value Ref Range Status   Specimen Description BLOOD LEFT HAND  Final   Special Requests BOTTLES DRAWN AEROBIC ONLY 6 CCS  Final   Culture PENDING  Incomplete   Report Status PENDING  Incomplete  Culture, blood (routine x 2)     Status: None (Preliminary result)   Collection Time: 11/16/15  7:10 AM  Result Value Ref Range Status   Specimen Description BLOOD LEFT HAND  Final   Special Requests BOTTLES DRAWN AEROBIC ONLY 5.0CCS  Final   Culture  PENDING  Incomplete   Report Status PENDING  Incomplete    Today   Subjective    [name] [name] today has no headache,no chest abdominal pain,no new weakness tingling or numbness, feels much better wants to go home today.     Objective   Blood pressure 150/70, pulse 86, temperature 97.4 F (36.3 C), temperature source Oral, resp. rate 18, height 6' (1.829 m), weight 117.1 kg (258 lb 2.5 oz), SpO2 94 %.   Intake/Output Summary (Last 24 hours) at 11/17/15 1005 Last data filed at 11/17/15 0908  Gross per 24 hour  Intake  360 ml  Output   3300 ml  Net  -2940 ml    Exam Awake Alert, Oriented x 3, No new F.N deficits, Normal affect [place].AT,PERRAL Supple Neck,No JVD, No cervical lymphadenopathy appriciated.  Symmetrical Chest wall movement, Good air movement bilaterally, CTAB RRR,No Gallops,Rubs or new Murmurs, No Parasternal Heave +ve B.Sounds, Abd Soft, Non tender, No organomegaly appriciated, No rebound -guarding or rigidity. No Cyanosis, Clubbing or edema, No new Rash or bruise   Data Review   CBC w Diff: Lab Results  Component Value Date   WBC 3.9* 11/17/2015   HGB 9.6* 11/17/2015   HCT 33.0* 11/17/2015   PLT 156 11/17/2015   LYMPHOPCT 15 11/15/2015   MONOPCT 10 11/15/2015   EOSPCT 1 11/15/2015   BASOPCT 0 11/15/2015    CMP: Lab Results  Component Value Date   NA 143 11/16/2015   K 4.3 11/16/2015   CL 103 11/16/2015   CO2 27 11/16/2015   BUN 27* 11/16/2015   CREATININE 3.86* 11/16/2015   PROT 4.6* 10/15/2015   ALBUMIN 2.9* 11/16/2015   BILITOT 1.2 10/15/2015   ALKPHOS 81 10/15/2015   AST 15 10/15/2015   ALT 11* 10/15/2015  .   Total Time in preparing paper work, data evaluation and todays exam - 35 minutes  Thurnell Lose M.D on 11/17/2015 at 10:05 AM  [facility]   Office  [phone]

## 2015-11-17 NOTE — Progress Notes (Signed)
Subjective:  Tmax 100.4 VQ low liklihood for PE - HD yest- removed 3500- tells me he is going home later   Objective Vital signs in last 24 hours: Filed Vitals:   11/16/15 1824 11/16/15 2100 11/17/15 0530 11/17/15 0746  BP: 154/51 146/52 160/51 150/70  Pulse: 79 100 96 86  Temp:  100.4 F (38 C) 97.6 F (36.4 C) 97.4 F (36.3 C)  TempSrc:  Oral Axillary Oral  Resp:  20 18 18   Height:      Weight:      SpO2:  93% 90% 91%   Weight change: 0.4 kg (14.1 oz)  Intake/Output Summary (Last 24 hours) at 11/17/15 1000 Last data filed at 11/17/15 0908  Gross per 24 hour  Intake    360 ml  Output   3300 ml  Net  -2940 ml   Dialysis Orders: Center: [facility] on TTS . 4 hrs 0 min, 180NRe Optiflux, BFR 400, DFR Manual 800 mL/min, EDW 122.5 (kg), Dialysate 3.0 K, 2.25 Ca, UFR Profile: None, Sodium Model: None, Access: RIJ Catheter-Tunneled Heparin: ALLERGIC TO PORCINE, use citrate to flush catheter Hectoral: 2 mcg IV Q TTS Venofer: 50 mg IV weekly (recently loaded with Fe-not started yet) Mircera: 150 mcg IV q 2 weeks ( last dose 11/07/15)  Assessment/Plan: 1. URI vs PE: per primary, VQ low liklihood. BC pending. On doxycycine empirically.  2. Acute on chronic congestive heart failure: HD yest, attempt to lower volume. Troponin +, possible demand ischemia? Per primary. troponin staying flat 3. ESRD - TTS at [facility]. Will have HD on schedule. K+ 4.4. 2.0 K bath- next due Tuesday  4. Hypertension/volume - BP initially high now controlled. On Clonidine and Labetalol. Will attempt 2.5-3 liters UFG in HD today. Post weight 117- will likely  need lower as OP - maybe 118-119 ? Also interested in transferring to [facility]- will be worked on as OP  5. Anemia - 9.6 rec'd ESA 11/07/15. Follow HGB,  6. Metabolic bone disease - Ca 8.2. Continue binders, hectoral.  7. Nutrition - Renal diet, renal vits. OP Albumin 3.3 11/14/15.  8. Immunosuppressant therapy: continue tracrolimus,  prednisone. tracrolimus dose recently decreased per Dr. Mercy Moore to 1mg  po daily 9. DM: per primary 10. H/O LLE DVT: continue coumadin, INR 2.41 11.    [name] [name]    Labs: Basic Metabolic Panel:  Recent Labs Lab 11/15/15 1845 11/16/15 0506 11/16/15 1200  NA 139 141 143  K 4.7 4.4 4.3  CL 101 102 103  CO2 27 29 27   GLUCOSE 300* 230* 195*  BUN 23* 26* 27*  CREATININE 3.59* 3.97* 3.86*  CALCIUM 8.3* 8.2* 8.2*  PHOS  --   --  3.5   Liver Function Tests:  Recent Labs Lab 11/16/15 1200  ALBUMIN 2.9*   No results for input(s): LIPASE, AMYLASE in the last 168 hours. No results for input(s): AMMONIA in the last 168 hours. CBC:  Recent Labs Lab 11/15/15 1845 11/16/15 0506 11/16/15 1200 11/17/15 0541  WBC 6.3 4.2 3.8* 3.9*  NEUTROABS 4.7  --   --   --   HGB 10.3* 9.6* 9.4* 9.6*  HCT 34.6* 33.0* 32.1* 33.0*  MCV 92.0 93.0 93.3 92.7  PLT 169 155 139* 156   Cardiac Enzymes:  Recent Labs Lab 11/15/15 1845 11/16/15 0700 11/16/15 1121 11/16/15 1900  TROPONINI 0.08* 0.07* 0.07* 0.07*   CBG:  Recent Labs Lab 11/16/15 0747 11/16/15 1159 11/16/15 1747 11/16/15 2007 11/17/15 0753  GLUCAP 157* 147* 115* 224*  265*    Iron Studies: No results for input(s): IRON, TIBC, TRANSFERRIN, FERRITIN in the last 72 hours. Studies/Results: Dg Chest 2 View  11/15/2015  CLINICAL DATA:  Fever and shortness of Breath EXAM: CHEST  2 VIEW COMPARISON:  10/17/2015 FINDINGS: Cardiac shadow is mildly enlarged. [name] right-sided dialysis catheter is noted in satisfactory position. The lungs are well aerated bilaterally. Mild central congestion is again seen with mild interstitial edema. No focal infiltrate or sizable effusion is noted. IMPRESSION: Mild CHF.  No focal confluent infiltrate is seen. Electronically Signed   By: Inez Catalina M.D.   On: 11/15/2015 16:11   Nm Pulmonary Perf And Vent  11/16/2015  CLINICAL DATA:  Acute bronchitis with fever at home, cough mild blood  streak in spetum Hx: Chronic DVT in lower Left 10/07/15, KCD, ESRD EXAM: NUCLEAR MEDICINE VENTILATION - PERFUSION LUNG SCAN TECHNIQUE: Ventilation images were obtained in multiple projections using inhaled aerosol Tc-68m DTPA. Perfusion images were obtained in multiple projections after intravenous injection of Tc-97m MAA. RADIOPHARMACEUTICALS:  30.1 Technetium-32m DTPA aerosol inhalation and 4.2 Technetium-57m MAA IV COMPARISON:  Radiograph from the previous day FINDINGS: Ventilation: Mildly asymmetric activity right greater than left. Perfusion: No mismatch. No wedge shaped peripheral perfusion defects to suggest acute pulmonary embolism. IMPRESSION: Low likelihood ratio for pulmonary embolism. Electronically Signed   By: Lucrezia Europe M.D.   On: 11/16/2015 14:59   Medications: Infusions:    Scheduled Medications: . calcium acetate  1,334 mg Oral TID WC  . clonazePAM  1 mg Oral QHS  . cloNIDine  0.3 mg Oral TID  . doxercalciferol  2 mcg Intravenous Q T,Th,Sa-HD  . doxycycline  100 mg Oral Q12H  . febuxostat  80 mg Oral Daily  . insulin aspart  0-20 Units Subcutaneous TID WC  . insulin aspart  0-5 Units Subcutaneous QHS  . insulin glargine  10 Units Subcutaneous QHS  . labetalol  100 mg Oral TID  . multivitamin  1 tablet Oral QHS  . pantoprazole  40 mg Oral Q1200  . predniSONE  5 mg Oral Daily  . rosuvastatin  20 mg Oral QHS  . sodium chloride flush  3 mL Intravenous Q12H  . tacrolimus  1 mg Oral Daily  . Warfarin - Pharmacist Dosing Inpatient   Does not apply q1800    have reviewed scheduled and prn medications.  Physical Exam: General: NAD Heart: RRR Lungs: mostly clear Abdomen: soft, non tender- obese Extremities: minimal edema Dialysis Access: tunneled cath     11/17/2015,10:00 AM  LOS: 2 days

## 2015-11-17 NOTE — Discharge Instructions (Signed)
Follow with Primary MD Hoyt Koch, MD in 2-3 days, get final blood culture results checked next visit  Get CBC, CMP, INR, 2 view Chest X ray checked  by Primary MD next visit.    Activity: As tolerated with Full fall precautions use walker/cane & assistance as needed   Disposition Home     Diet:   Renal - Low Carb,  Check your Weight same time everyday, if you gain over 2 pounds, or you develop in leg swelling, experience more shortness of breath or chest pain, call your Primary MD immediately. Follow Cardiac Low Salt Diet and 1.2 lit/day fluid restriction.  Accuchecks 4 times/day, Once in AM empty stomach and then before each meal. Log in all results and show them to your Prim.MD in 3 days. If any glucose reading is under 80 or above 300 call your Prim MD immidiately. Follow Low glucose instructions for glucose under 80 as instructed.  On your next visit with your primary care physician please Get Medicines reviewed and adjusted.   Please request your Prim.MD to go over all Hospital Tests and Procedure/Radiological results at the follow up, please get all Hospital records sent to your Prim MD by signing hospital release before you go home.   If you experience worsening of your admission symptoms, develop shortness of breath, life threatening emergency, suicidal or homicidal thoughts you must seek medical attention immediately by calling 911 or calling your MD immediately  if symptoms less severe.  You Must read complete instructions/literature along with all the possible adverse reactions/side effects for all the Medicines you take and that have been prescribed to you. Take any new Medicines after you have completely understood and accpet all the possible adverse reactions/side effects.   Do not drive, operating heavy machinery, perform activities at heights, swimming or participation in water activities or provide baby sitting services if your were admitted for syncope or  siezures until you have seen by Primary MD or a Neurologist and advised to do so again.  Do not drive when taking Pain medications.    Do not take more than prescribed Pain, Sleep and Anxiety Medications  Special Instructions: If you have smoked or chewed Tobacco  in the last 2 yrs please stop smoking, stop any regular Alcohol  and or any Recreational drug use.  Wear Seat belts while driving.   Please note  You were cared for by a hospitalist during your hospital stay. If you have any questions about your discharge medications or the care you received while you were in the hospital after you are discharged, you can call the unit and asked to speak with the hospitalist on call if the hospitalist that took care of you is not available. Once you are discharged, your primary care physician will handle any further medical issues. Please note that NO REFILLS for any discharge medications will be authorized once you are discharged, as it is imperative that you return to your primary care physician (or establish a relationship with a primary care physician if you do not have one) for your aftercare needs so that they can reassess your need for medications and monitor your lab values.

## 2015-11-17 NOTE — Care Management Important Message (Signed)
Important Message  Patient Details  Name: [name] [name] MRN: [id number] Date of Birth: 1966/08/07   Medicare Important Message Given:       Laurena Slimmer, RN 11/17/2015, 12:49 PM

## 2015-11-17 NOTE — Progress Notes (Signed)
Utilization Review Completed.[name] [name] T2/02/2016  

## 2015-11-17 NOTE — Progress Notes (Signed)
Patient discharge teaching given, including activity, diet, follow-up appoints, and medications. Patient verbalized understanding of all discharge instructions. IV access was d/c'd. Vitals are stable. Skin is intact except as charted in most recent assessments. Pt to be escorted out by NT, to be driven home by family.  Mala Gibbard, MBA, BS, RN 

## 2015-11-17 NOTE — Care Management Important Message (Signed)
Important Message  Patient Details  Name: Leanne Buckert MRN: [id number] Date of Birth: 1966/03/22   Medicare Important Message Given:   yes    Laurena Slimmer, RN 11/17/2015, 10:53 AM

## 2015-11-18 ENCOUNTER — Ambulatory Visit (HOSPITAL_COMMUNITY): Payer: Medicare Other | Attending: Cardiovascular Disease

## 2015-11-18 ENCOUNTER — Encounter: Payer: Self-pay | Admitting: Physician Assistant

## 2015-11-18 ENCOUNTER — Other Ambulatory Visit: Payer: Self-pay

## 2015-11-18 DIAGNOSIS — Z6837 Body mass index (BMI) 37.0-37.9, adult: Secondary | ICD-10-CM | POA: Diagnosis not present

## 2015-11-18 DIAGNOSIS — I351 Nonrheumatic aortic (valve) insufficiency: Secondary | ICD-10-CM | POA: Diagnosis not present

## 2015-11-18 DIAGNOSIS — E785 Hyperlipidemia, unspecified: Secondary | ICD-10-CM | POA: Diagnosis not present

## 2015-11-18 DIAGNOSIS — I509 Heart failure, unspecified: Secondary | ICD-10-CM | POA: Diagnosis not present

## 2015-11-18 DIAGNOSIS — G4733 Obstructive sleep apnea (adult) (pediatric): Secondary | ICD-10-CM | POA: Diagnosis not present

## 2015-11-18 DIAGNOSIS — I120 Hypertensive chronic kidney disease with stage 5 chronic kidney disease or end stage renal disease: Secondary | ICD-10-CM | POA: Diagnosis not present

## 2015-11-18 DIAGNOSIS — E669 Obesity, unspecified: Secondary | ICD-10-CM | POA: Diagnosis not present

## 2015-11-18 DIAGNOSIS — I071 Rheumatic tricuspid insufficiency: Secondary | ICD-10-CM | POA: Insufficient documentation

## 2015-11-18 DIAGNOSIS — I132 Hypertensive heart and chronic kidney disease with heart failure and with stage 5 chronic kidney disease, or end stage renal disease: Secondary | ICD-10-CM | POA: Diagnosis not present

## 2015-11-18 DIAGNOSIS — J449 Chronic obstructive pulmonary disease, unspecified: Secondary | ICD-10-CM | POA: Diagnosis not present

## 2015-11-18 DIAGNOSIS — E1122 Type 2 diabetes mellitus with diabetic chronic kidney disease: Secondary | ICD-10-CM | POA: Diagnosis not present

## 2015-11-18 DIAGNOSIS — I517 Cardiomegaly: Secondary | ICD-10-CM | POA: Diagnosis not present

## 2015-11-18 DIAGNOSIS — N186 End stage renal disease: Secondary | ICD-10-CM | POA: Diagnosis not present

## 2015-11-18 DIAGNOSIS — I359 Nonrheumatic aortic valve disorder, unspecified: Secondary | ICD-10-CM | POA: Diagnosis present

## 2015-11-18 DIAGNOSIS — I340 Nonrheumatic mitral (valve) insufficiency: Secondary | ICD-10-CM | POA: Insufficient documentation

## 2015-11-18 DIAGNOSIS — I82432 Acute embolism and thrombosis of left popliteal vein: Secondary | ICD-10-CM | POA: Diagnosis not present

## 2015-11-19 ENCOUNTER — Telehealth: Payer: Self-pay | Admitting: *Deleted

## 2015-11-19 ENCOUNTER — Encounter: Payer: Self-pay | Admitting: Vascular Surgery

## 2015-11-19 DIAGNOSIS — N186 End stage renal disease: Secondary | ICD-10-CM | POA: Diagnosis not present

## 2015-11-19 DIAGNOSIS — D631 Anemia in chronic kidney disease: Secondary | ICD-10-CM | POA: Diagnosis not present

## 2015-11-19 DIAGNOSIS — N2581 Secondary hyperparathyroidism of renal origin: Secondary | ICD-10-CM | POA: Diagnosis not present

## 2015-11-19 NOTE — Telephone Encounter (Signed)
Fine with me

## 2015-11-19 NOTE — Telephone Encounter (Signed)
Called [name] no answer LMOM with md response...[name]

## 2015-11-19 NOTE — Telephone Encounter (Signed)
Left msg on triage stating requesting verbal order for SW to assist with community resources...[name] [name]

## 2015-11-21 DIAGNOSIS — I82492 Acute embolism and thrombosis of other specified deep vein of left lower extremity: Secondary | ICD-10-CM | POA: Diagnosis not present

## 2015-11-21 DIAGNOSIS — N186 End stage renal disease: Secondary | ICD-10-CM | POA: Diagnosis not present

## 2015-11-21 DIAGNOSIS — N2581 Secondary hyperparathyroidism of renal origin: Secondary | ICD-10-CM | POA: Diagnosis not present

## 2015-11-21 DIAGNOSIS — D631 Anemia in chronic kidney disease: Secondary | ICD-10-CM | POA: Diagnosis not present

## 2015-11-21 LAB — CULTURE, BLOOD (ROUTINE X 2)
Culture: NO GROWTH
Culture: NO GROWTH

## 2015-11-22 ENCOUNTER — Other Ambulatory Visit (INDEPENDENT_AMBULATORY_CARE_PROVIDER_SITE_OTHER): Payer: Medicare Other

## 2015-11-22 ENCOUNTER — Ambulatory Visit (INDEPENDENT_AMBULATORY_CARE_PROVIDER_SITE_OTHER): Payer: Medicare Other | Admitting: General Practice

## 2015-11-22 ENCOUNTER — Telehealth: Payer: Self-pay | Admitting: Endocrinology

## 2015-11-22 DIAGNOSIS — I132 Hypertensive heart and chronic kidney disease with heart failure and with stage 5 chronic kidney disease, or end stage renal disease: Secondary | ICD-10-CM | POA: Diagnosis not present

## 2015-11-22 DIAGNOSIS — N186 End stage renal disease: Secondary | ICD-10-CM | POA: Diagnosis not present

## 2015-11-22 DIAGNOSIS — E1165 Type 2 diabetes mellitus with hyperglycemia: Secondary | ICD-10-CM

## 2015-11-22 DIAGNOSIS — Z794 Long term (current) use of insulin: Secondary | ICD-10-CM

## 2015-11-22 DIAGNOSIS — J449 Chronic obstructive pulmonary disease, unspecified: Secondary | ICD-10-CM | POA: Diagnosis not present

## 2015-11-22 DIAGNOSIS — I509 Heart failure, unspecified: Secondary | ICD-10-CM | POA: Diagnosis not present

## 2015-11-22 DIAGNOSIS — I82432 Acute embolism and thrombosis of left popliteal vein: Secondary | ICD-10-CM | POA: Diagnosis not present

## 2015-11-22 DIAGNOSIS — E1122 Type 2 diabetes mellitus with diabetic chronic kidney disease: Secondary | ICD-10-CM | POA: Diagnosis not present

## 2015-11-22 DIAGNOSIS — E119 Type 2 diabetes mellitus without complications: Secondary | ICD-10-CM | POA: Diagnosis not present

## 2015-11-22 LAB — COMPREHENSIVE METABOLIC PANEL
ALT: 6 U/L (ref 0–53)
AST: 12 U/L (ref 0–37)
Albumin: 3.3 g/dL — ABNORMAL LOW (ref 3.5–5.2)
Alkaline Phosphatase: 109 U/L (ref 39–117)
BUN: 25 mg/dL — ABNORMAL HIGH (ref 6–23)
CO2: 31 mEq/L (ref 19–32)
Calcium: 8.5 mg/dL (ref 8.4–10.5)
Chloride: 102 mEq/L (ref 96–112)
Creatinine, Ser: 2.87 mg/dL — ABNORMAL HIGH (ref 0.40–1.50)
GFR: 24.71 mL/min — ABNORMAL LOW (ref >60.00)
Glucose, Bld: 171 mg/dL — ABNORMAL HIGH (ref 70–99)
Potassium: 4 mEq/L (ref 3.5–5.1)
Sodium: 141 mEq/L (ref 135–145)
Total Bilirubin: 1.4 mg/dL — ABNORMAL HIGH (ref 0.2–1.2)
Total Protein: 6.2 g/dL (ref 6.0–8.3)

## 2015-11-22 LAB — HEMOGLOBIN A1C: Hgb A1c MFr Bld: 7.2 % — ABNORMAL HIGH (ref 4.6–6.5)

## 2015-11-22 LAB — POCT INR: INR: 1.6

## 2015-11-22 NOTE — Telephone Encounter (Signed)
Attempted to reach the pt. Will try again at a later time.  

## 2015-11-22 NOTE — Telephone Encounter (Signed)
See note below would you like to try and get the medication approved or change to the generic NPH?

## 2015-11-22 NOTE — Progress Notes (Signed)
I have reviewed and agree with the plan. 

## 2015-11-22 NOTE — Telephone Encounter (Signed)
He needs to find out if they will pay for the U-500 bottle instead of the pen

## 2015-11-22 NOTE — Telephone Encounter (Signed)
Patient stated that the only thing you need to do is just approve the prior Auth.

## 2015-11-22 NOTE — Telephone Encounter (Signed)
[name] [name],  I tried to contact this pt, but was unsuccessful. Be advised of note below.

## 2015-11-22 NOTE — Telephone Encounter (Signed)
Need Prior Auth on medication Humilim U - 500

## 2015-11-22 NOTE — Progress Notes (Signed)
Pre visit review using our clinic review tool, if applicable. No additional management support is needed unless otherwise documented below in the visit note. 

## 2015-11-23 DIAGNOSIS — N186 End stage renal disease: Secondary | ICD-10-CM | POA: Diagnosis not present

## 2015-11-23 DIAGNOSIS — D631 Anemia in chronic kidney disease: Secondary | ICD-10-CM | POA: Diagnosis not present

## 2015-11-23 DIAGNOSIS — N2581 Secondary hyperparathyroidism of renal origin: Secondary | ICD-10-CM | POA: Diagnosis not present

## 2015-11-25 ENCOUNTER — Encounter: Payer: Self-pay | Admitting: Vascular Surgery

## 2015-11-25 ENCOUNTER — Ambulatory Visit (INDEPENDENT_AMBULATORY_CARE_PROVIDER_SITE_OTHER): Payer: Medicare Other | Admitting: Vascular Surgery

## 2015-11-25 VITALS — BP 135/61 | HR 84 | Temp 98.8°F | Resp 16 | Ht 72.0 in | Wt 256.8 lb

## 2015-11-25 DIAGNOSIS — N186 End stage renal disease: Secondary | ICD-10-CM | POA: Insufficient documentation

## 2015-11-25 LAB — FRUCTOSAMINE: Fructosamine: 298 umol/L — ABNORMAL HIGH (ref 190–270)

## 2015-11-25 NOTE — Progress Notes (Signed)
Subjective:     Patient ID: [name] [name], male   DOB: 02-17-1966, 50 y.o.   MRN: [id number]  HPI This 50 year old male with end-stage renal disease returns for follow-up regarding a right basilic vein transposition performed on 09/27/2015. Patient had an uneventful postoperative course although several days later he did require readmission to the hospital for blood clot in his left leg and remained in intensive care unit for several days. He denies any numbness in the right hand. He is on hemodialysis 3 days a week with a catheter in the right IJ. 3 days ago he awoke from a nap with pain in his right wrist which had not been previously present. Denies any trauma to the wrist. He has no history of gout.   Past Medical History  Diagnosis Date  . Hypertension   . Leg pain 02/05/11    with walking  . Weight gain   . Shortness of breath     when lying flat  . Diminished eyesight   . Hearing difficulty   . Anemia   . Nervousness(799.21)   . COPD (chronic obstructive pulmonary disease) ([place])   . CHF (congestive heart failure) ([place])   . Posttraumatic stress disorder   . Allergic rhinitis   . Dyslipidemia   . Morbid obesity ([place])   . Gait disorder   . Bilateral carpal tunnel syndrome   . Gastroparesis   . Gout   . Diabetic retinopathy ([place])   . Renal transplant, status post 04/05/2014  . Incomplete bladder emptying   . Urethral stricture   . Aortic insufficiency     a. Echo 7/16:  Mod LVH, EF 50-55%, mild to mod AI, severe LAE, PASP 57 mmHg (LV ID end diastolic [id number] mm);  b. Echo 2/17:  Mod LVH, EF 50-55%, mod AI, MAC, mild MR, mod LAE, mild to mod TR, PASP 63 mmHg  . Hyperlipidemia   . Osteomyelitis ([place])   . Endocarditis   . Vitamin D deficiency   . Secondary hyperparathyroidism ([place])   . Rotator cuff disorder   . Discitis of lumbar region     resolved  . Obstructive sleep apnea     not using CPAP- lost 100lbs  . Claustrophobia   . Diabetes mellitus     Type II  . Diabetic  peripheral neuropathy ([place])   . GERD (gastroesophageal reflux disease)   . Sepsis ([place])     Postive blood cultures -   . Endocarditis 11/28/14  . Complication of anesthesia     "they said something at [facility] it was affected by sleep aqpnea."  . Chronic kidney disease     Kidney Transplant 2009 09/26/15- transplanated kidney failing  . Chronic renal allograft nephropathy   . Chronic kidney disease (CKD), stage IV (severe) ([place])   . DVT (deep venous thrombosis) ([place]) 10/07/2015    LLE    Social History  Substance Use Topics  . Smoking status: Never Smoker   . Smokeless tobacco: Never Used  . Alcohol Use: No    Family History  Problem Relation Age of Onset  . Hypertension Mother   . Diabetes Father     Allergies  Allergen Reactions  . Pork-Derived Products Anaphylaxis and Other (See Comments)    Fever also   . Ace Inhibitors Cough  . Hydrocodone Other (See Comments)    Hallucinations     Current outpatient prescriptions:  .  ACCU-CHEK FASTCLIX LANCETS MISC, Use to check blood sugar 4 times per day dx  code E11.9, Disp: 408 each, Rfl: 1 .  calcium acetate (PHOSLO) 667 MG capsule, Take 3 capsules (2,001 mg total) by mouth 3 (three) times daily with meals., Disp: 270 capsule, Rfl: 0 .  clonazePAM (KLONOPIN) 0.5 MG tablet, Take 1 mg by mouth at bedtime. , Disp: , Rfl:  .  cloNIDine (CATAPRES) 0.3 MG tablet, Take 1 tablet (0.3 mg total) by mouth 3 (three) times daily., Disp: 270 tablet, Rfl: 1 .  doxycycline (VIBRA-TABS) 100 MG tablet, Take 1 tablet (100 mg total) by mouth every 12 (twelve) hours., Disp: 10 tablet, Rfl: 0 .  Febuxostat (ULORIC) 80 MG TABS, Take 80 mg by mouth daily., Disp: , Rfl:  .  fluconazole (DIFLUCAN) 50 MG tablet, Take 50 mg by mouth every Monday, Wednesday, and Friday. , Disp: , Rfl:  .  glucose blood (ACCU-CHEK SMARTVIEW) test strip, Use as instructed to check blood sugar 4 times per day dx code E11.9, Disp: 400 each, Rfl: 1 .   guaiFENesin-dextromethorphan (ROBITUSSIN DM) 100-10 MG/5ML syrup, Take 5 mLs by mouth every 4 (four) hours as needed for cough., Disp: 118 mL, Rfl: 0 .  insulin aspart (NOVOLOG FLEXPEN) 100 UNIT/ML FlexPen, Inject 6 Units into the skin 3 (three) times daily with meals., Disp: 15 mL, Rfl: 0 .  Insulin Glargine (LANTUS) 100 UNIT/ML Solostar Pen, Inject 35 Units into the skin daily at 10 pm., Disp: 15 mL, Rfl: 0 .  Insulin Pen Needle 32G X 8 MM MISC, Use as directed, Disp: 100 each, Rfl: 0 .  labetalol (NORMODYNE) 100 MG tablet, Take 1 tablet (100 mg total) by mouth 3 (three) times daily., Disp: 90 tablet, Rfl: 0 .  omeprazole (PRILOSEC) 20 MG capsule, Take 20 mg by mouth daily., Disp: , Rfl:  .  predniSONE (DELTASONE) 5 MG tablet, Take 5 mg by mouth daily.  , Disp: , Rfl:  .  rosuvastatin (CRESTOR) 20 MG tablet, Take 1 tablet (20 mg total) by mouth at bedtime., Disp: 90 tablet, Rfl: 3 .  tacrolimus (PROGRAF) 1 MG capsule, Take 1 capsule (1 mg total) by mouth 2 (two) times daily., Disp: 60 capsule, Rfl: 0 .  tadalafil (CIALIS) 5 MG tablet, TAKE 1 TABLET EVERY DAY, Disp: , Rfl:  .  warfarin (COUMADIN) 5 MG tablet, Take 1 tablet (5 mg total) by mouth daily at 6 PM. Starting 10/26/14 (Patient taking differently: Take 2.5-5 mg by mouth daily at 6 PM. Take 1/2 tablet (2.5mg  on Monday and Friday, whole tablet (5mg ) other days.), Disp: 30 tablet, Rfl: 0  Filed Vitals:   11/25/15 1204  BP: 135/61  Pulse: 84  Temp: 98.8 F (37.1 C)  Resp: 16  Height: 6' (1.829 m)  Weight: 256 lb 13.4 oz (116.5 kg)  SpO2: 98%    Body mass index is 34.83 kg/(m^2).           Review of Systems     Objective:   Physical Exam BP 135/61 mmHg  Pulse 84  Temp(Src) 98.8 F (37.1 C)  Resp 16  Ht 6' (1.829 m)  Wt 256 lb 13.4 oz (116.5 kg)  BMI 34.83 kg/m2  SpO2 98%   Gen. Well-developed well-nourished male in a wheelchair alert and oriented 3 complaining of some discomfort in his right lateral wrist  right  upper extremity has excellent pulse and palpable thrill and upper arm basilic vein transposition with incisions well healed  Right hand is warm and well perfused with intact sensation equal to contralateral left hand  patient  does have 1+ edema in the hand and fingers. Patient points to lateral wrist area which she states is the tender area and also has involved the medial wrist area. Patient does have good motor function but straightening or extending the fingers does cause more discomfort. No evidence of ischemia of any of the digits.    excellent radial and ulnar Doppler flow with the basilic vein fistula open and when I compress the fistula there is no change in the radial and ulnar flow which is triphasic     Assessment:      no evidence of steal syndrome from right basilic vein transposition due to excellent distal arterial flow which is unchanged with compression of the fistula an note numbness in the hand and a well-perfused right hand  acute onset of pain and right wrist sounds more like an acute arthritis-type problem.     Plan:      patient will return to medical doctor for continued follow-up Might consider orthopedic referral to see if this might represent gout or some other arthritic situation   Okay to use fistula 12/26/2015 Return to see me on when necessary basis

## 2015-11-26 ENCOUNTER — Other Ambulatory Visit: Payer: Self-pay | Admitting: *Deleted

## 2015-11-26 DIAGNOSIS — N186 End stage renal disease: Secondary | ICD-10-CM | POA: Diagnosis not present

## 2015-11-26 DIAGNOSIS — D631 Anemia in chronic kidney disease: Secondary | ICD-10-CM | POA: Diagnosis not present

## 2015-11-26 DIAGNOSIS — N2581 Secondary hyperparathyroidism of renal origin: Secondary | ICD-10-CM | POA: Diagnosis not present

## 2015-11-27 ENCOUNTER — Encounter: Payer: Self-pay | Admitting: Endocrinology

## 2015-11-27 ENCOUNTER — Other Ambulatory Visit: Payer: Self-pay | Admitting: *Deleted

## 2015-11-27 ENCOUNTER — Ambulatory Visit (INDEPENDENT_AMBULATORY_CARE_PROVIDER_SITE_OTHER): Payer: Medicare Other | Admitting: Endocrinology

## 2015-11-27 VITALS — BP 146/44 | HR 89 | Temp 98.6°F | Resp 14 | Ht 72.0 in | Wt 256.2 lb

## 2015-11-27 DIAGNOSIS — Z794 Long term (current) use of insulin: Secondary | ICD-10-CM | POA: Diagnosis not present

## 2015-11-27 DIAGNOSIS — E1165 Type 2 diabetes mellitus with hyperglycemia: Secondary | ICD-10-CM

## 2015-11-27 DIAGNOSIS — Z992 Dependence on renal dialysis: Secondary | ICD-10-CM | POA: Insufficient documentation

## 2015-11-27 MED ORDER — BAYER MICROLET LANCETS MISC: Status: DC

## 2015-11-27 MED ORDER — INSULIN REGULAR HUMAN (CONC) 500 UNIT/ML ~~LOC~~ SOPN: PEN_INJECTOR | SUBCUTANEOUS | Status: DC

## 2015-11-27 MED ORDER — GLUCOSE BLOOD VI STRP: ORAL_STRIP | Status: DC

## 2015-11-27 NOTE — Patient Instructions (Signed)
Toujeo 85 twice daily  U-500 take 15 at Bfst, 25 at lunch and 20 at dinner, 30 min before meals

## 2015-11-27 NOTE — Progress Notes (Signed)
Patient ID: [name] [name], male   DOB: 20-Dec-1965, 50 y.o.   MRN: [id number]           Reason for Appointment: Follow-up for Type 2 Diabetes  History of Present Illness:          Date of diagnosis of type 2 diabetes mellitus : 1990       Background history:  He has been taking insulin since about 1999, previously was on unknown oral agents He had good control with insulin initially but A1c was much higher in 2008 and subsequently has had required larger doses of insulin He has been on Lantus for the last few years along with Novolog,followed by an endocrinologist since 2008 His A1c was 6.3 in 04/2014   Prior to his consultation he was on a regimen of Novolog , 50 units 5 times a day with each meal and snack along with 90 Units 3 times a day of Lantus   Recent history:   INSULIN regimen is :U-500 insulin with KwikPen 10 before bfst, 25 before lunch and 20 before dinner, take 30 min before   Sliding scale use for high sugars: Sugar 150-199 take 2 units, 200-250= 4 units and over 250 take 6 units  TOUJEO 90 units twice daily  With U-500 insulin and twice a day Toujeo his blood sugars have been overall better and he has overall much lower insulin requirement especially at mealtimes  In the recent hospitalization his blood sugar was poorly controlled He is now having difficulty with his insurance not approving his U-500 insulin A1c is better than before at 7.2, generally lower than expected for his sugars because of renal failure  Currently getting 5 mg prednisone  Current blood sugar patterns and problems identified:  He did not bring his monitor today  He was not able to find his glucose monitor when he left the hospital  Although he was told to take 30 units of U-500 at lunch he thinks this was too much and is taking only 25  However lab glucose was high after breakfast  He is compliant with his Toujeo.  No overnight hypoglycemia  Compliance with the medical regimen: usually  good Hypoglycemia: As above  Glucose monitoring:  done  4-5 times a day         Glucometer: Aviva      Blood Glucose readings not available    Self-care: The diet that the patient has been following is: tries to limit high-fat meals, usually eating  2-3 meals a day.     Typical meal intake: Breakfast is grits with pecans, olive oil. Dinner 6 pm               Dietician visit, most recent:2013               Exercise:  unable to do any  Weight history:  Wt Readings from Last 3 Encounters:  11/27/15 256 lb 3.2 oz (116.212 kg)  11/25/15 256 lb 13.4 oz (116.5 kg)  11/16/15 258 lb 2.5 oz (117.1 kg)    Glycemic control:   Lab Results  Component Value Date   HGBA1C 7.2* 11/22/2015   HGBA1C 7.7* 10/17/2015   HGBA1C 6.5 08/26/2015   Lab Results  Component Value Date   MICROALBUR 232.9* 06/19/2015   LDLCALC 83 06/19/2015   CREATININE 2.87* 11/22/2015         Medication List       This list is accurate as of: 11/27/15  9:15 PM.  Always use your most recent med list.               BAYER MICROLET LANCETS lancets  Use as instructed to check blood sugar 4 times per day dx code E11.9     calcium acetate 667 MG capsule  Commonly known as:  PHOSLO  Take 3 capsules (2,001 mg total) by mouth 3 (three) times daily with meals.     CIALIS 5 MG tablet  Generic drug:  tadalafil  TAKE 1 TABLET EVERY DAY     clonazePAM 0.5 MG tablet  Commonly known as:  KLONOPIN  Take 1 mg by mouth at bedtime.     cloNIDine 0.3 MG tablet  Commonly known as:  CATAPRES  Take 1 tablet (0.3 mg total) by mouth 3 (three) times daily.     doxycycline 100 MG tablet  Commonly known as:  VIBRA-TABS  Take 1 tablet (100 mg total) by mouth every 12 (twelve) hours.     fluconazole 50 MG tablet  Commonly known as:  DIFLUCAN  Take 50 mg by mouth every Monday, Wednesday, and Friday.     furosemide 80 MG tablet  Commonly known as:  LASIX     glucose blood test strip  Commonly known as:  BAYER CONTOUR  NEXT TEST  Use as instructed to check blood sugar 4 times per day dx code E11.9     guaiFENesin-dextromethorphan 100-10 MG/5ML syrup  Commonly known as:  ROBITUSSIN DM  Take 5 mLs by mouth every 4 (four) hours as needed for cough.     insulin aspart 100 UNIT/ML FlexPen  Commonly known as:  NOVOLOG FLEXPEN  Inject 6 Units into the skin 3 (three) times daily with meals.     Insulin Glargine 100 UNIT/ML Solostar Pen  Commonly known as:  LANTUS  Inject 35 Units into the skin daily at 10 pm.     Insulin Pen Needle 32G X 8 MM Misc  Use as directed     BD PEN NEEDLE NANO U/F 32G X 4 MM Misc  Generic drug:  Insulin Pen Needle     insulin regular human CONCENTRATED 500 UNIT/ML kwikpen  Commonly known as:  HUMULIN R  Inject 15 units at breakfast, 25 units at lunch and 20 units before supper     labetalol 100 MG tablet  Commonly known as:  NORMODYNE  Take 1 tablet (100 mg total) by mouth 3 (three) times daily.     omeprazole 20 MG capsule  Commonly known as:  PRILOSEC  Take 20 mg by mouth daily.     predniSONE 5 MG tablet  Commonly known as:  DELTASONE  Take 5 mg by mouth daily.     rosuvastatin 20 MG tablet  Commonly known as:  CRESTOR  Take 1 tablet (20 mg total) by mouth at bedtime.     tacrolimus 1 MG capsule  Commonly known as:  PROGRAF  Take 1 capsule (1 mg total) by mouth 2 (two) times daily.     ULORIC 80 MG Tabs  Generic drug:  Febuxostat  Take 80 mg by mouth daily.     warfarin 5 MG tablet  Commonly known as:  COUMADIN  Take 1 tablet (5 mg total) by mouth daily at 6 PM. Starting 10/26/14        Allergies:  Allergies  Allergen Reactions  . Pork-Derived Products Anaphylaxis and Other (See Comments)    Fever also   . Ace Inhibitors Cough  . Hydrocodone Other (See  Comments)    Hallucinations    Past Medical History  Diagnosis Date  . Hypertension   . Leg pain 02/05/11    with walking  . Weight gain   . Shortness of breath     when lying flat  .  Diminished eyesight   . Hearing difficulty   . Anemia   . Nervousness(799.21)   . COPD (chronic obstructive pulmonary disease) ([place])   . CHF (congestive heart failure) ([place])   . Posttraumatic stress disorder   . Allergic rhinitis   . Dyslipidemia   . Morbid obesity ([place])   . Gait disorder   . Bilateral carpal tunnel syndrome   . Gastroparesis   . Gout   . Diabetic retinopathy ([place])   . Renal transplant, status post 04/05/2014  . Incomplete bladder emptying   . Urethral stricture   . Aortic insufficiency     a. Echo 7/16:  Mod LVH, EF 50-55%, mild to mod AI, severe LAE, PASP 57 mmHg (LV ID end diastolic Q000111Q mm);  b. Echo 2/17:  Mod LVH, EF 50-55%, mod AI, MAC, mild MR, mod LAE, mild to mod TR, PASP 63 mmHg  . Hyperlipidemia   . Osteomyelitis ([place])   . Endocarditis   . Vitamin D deficiency   . Secondary hyperparathyroidism ([place])   . Rotator cuff disorder   . Discitis of lumbar region     resolved  . Obstructive sleep apnea     not using CPAP- lost 100lbs  . Claustrophobia   . Diabetes mellitus     Type II  . Diabetic peripheral neuropathy ([place])   . GERD (gastroesophageal reflux disease)   . Sepsis ([place])     Postive blood cultures -   . Endocarditis 11/28/14  . Complication of anesthesia     "they said something at [facility] it was affected by sleep aqpnea."  . Chronic kidney disease     Kidney Transplant 2009 09/26/15- transplanated kidney failing  . Chronic renal allograft nephropathy   . Chronic kidney disease (CKD), stage IV (severe) ([place])   . DVT (deep venous thrombosis) ([place]) 10/07/2015    LLE    Past Surgical History  Procedure Laterality Date  . Kidney transplant  2009  . Cataract extraction w/ intraocular lens  implant, bilateral Bilateral   . Av fistula placement Bilateral 2015  . Arteriovenous graft placement Bilateral "several"  . Cystoscopy w/ internal urethrotomy  09/2014  . Bascilic vein transposition Right 09/27/2015  . Bascilic vein  transposition Right 09/27/2015    Procedure: BASILIC VEIN TRANSPOSITION right;  Surgeon: Mal Misty, MD;  Location: [facility];  Service: Vascular;  Laterality: Right;    Family History  Problem Relation Age of Onset  . Hypertension Mother   . Diabetes Father     Social History:  reports that he has never smoked. He has never used smokeless tobacco. He reports that he does not drink alcohol or use illicit drugs.    Review of Systems    ROS  The following is a copy of the previous note  Lipid history: He is on 20 mg Crestor    Lab Results  Component Value Date   CHOL 143 06/19/2015   HDL 37.40* 06/19/2015   LDLCALC 83 06/19/2015   TRIG 115.0 06/19/2015   CHOLHDL 4 06/19/2015            Eyes: Most recent eye exam was 6/15  Hypertension: His blood pressure is now controlled with increased dose  of clonidine and is followed by nephrologist   RENAL: He has had a transplant in 2009 and now he is back on dialysis  Neurological: Has has had some decreased sensation in his feet, no burning.  Sometimes gets sharp pains in his feet and toes, no tingling He has been given Lyrica by his neurologist and takes 50 mg at night with only some relief  He has had marked decrease in his balance and also some weakness in his legs, using a power scooter  Last foot exam was in 03/2015: Absent monofilament sensation in his feet and toes and extending up to the middle of the lower leg on the left and up to the ankle on the right  LABS:  Lab on 11/22/2015  Component Date Value Ref Range Status  . Hgb A1c MFr Bld 11/22/2015 7.2* 4.6 - 6.5 % Final   Glycemic Control Guidelines for People with Diabetes:Non Diabetic:  <6%Goal of Therapy: <7%Additional Action Suggested:  >8%   . Fructosamine 11/22/2015 298* 190 - 270 umol/L Final  . Sodium 11/22/2015 141  135 - 145 mEq/L Final  . Potassium 11/22/2015 4.0  3.5 - 5.1 mEq/L Final  . Chloride 11/22/2015 102  96 - 112 mEq/L Final  . CO2 11/22/2015 31   19 - 32 mEq/L Final  . Glucose, Bld 11/22/2015 171* 70 - 99 mg/dL Final  . BUN 11/22/2015 25* 6 - 23 mg/dL Final  . Creatinine, Ser 11/22/2015 2.87* 0.40 - 1.50 mg/dL Final  . Total Bilirubin 11/22/2015 1.4* 0.2 - 1.2 mg/dL Final  . Alkaline Phosphatase 11/22/2015 109  39 - 117 U/L Final  . AST 11/22/2015 12  0 - 37 U/L Final  . ALT 11/22/2015 6  0 - 53 U/L Final  . Total Protein 11/22/2015 6.2  6.0 - 8.3 g/dL Final  . Albumin 11/22/2015 3.3* 3.5 - 5.2 g/dL Final  . Calcium 11/22/2015 8.5  8.4 - 10.5 mg/dL Final  . GFR 11/22/2015 24.71* >60.00 mL/min Final  Anti-coag visit on 11/22/2015  Component Date Value Ref Range Status  . INR 11/22/2015 1.6   Final    Physical Examination:  BP 146/44 mmHg  Pulse 89  Temp(Src) 98.6 F (37 C)  Resp 14  Ht 6' (1.829 m)  Wt 256 lb 3.2 oz (116.212 kg)  BMI 34.74 kg/m2  SpO2 96%      ASSESSMENT:  Diabetes type 2, long-standing with obesity See history of present illness for detailed discussion of his current management, blood sugar patterns and problems identified.  Not able to analyze his blood sugars He may have high readings after breakfast He reports relatively low readings fasting and did not reduce his to jail on the last visit as directed A1c 7.2   PLAN:   We'll increase his U-500 insulin to at least 15 units at breakfast time  Continue trying to take this 30 minutes before eating  Reduce the Toujeo insulin by 5 units to 85   Start using the new Contour meter  Bring monitor on the next visit for review of blood sugar patterns  Patient Instructions  Toujeo 85 twice daily  U-500 take 15 at Bfst, 25 at lunch and 20 at dinner, 30 min before meals       Maki Sweetser 11/27/2015, 9:15 PM   Note: This office note was prepared with Estate agent. Any transcriptional errors that result from this process are unintentional.

## 2015-11-28 DIAGNOSIS — N2581 Secondary hyperparathyroidism of renal origin: Secondary | ICD-10-CM | POA: Diagnosis not present

## 2015-11-28 DIAGNOSIS — D631 Anemia in chronic kidney disease: Secondary | ICD-10-CM | POA: Diagnosis not present

## 2015-11-28 DIAGNOSIS — I82492 Acute embolism and thrombosis of other specified deep vein of left lower extremity: Secondary | ICD-10-CM | POA: Diagnosis not present

## 2015-11-28 DIAGNOSIS — N186 End stage renal disease: Secondary | ICD-10-CM | POA: Diagnosis not present

## 2015-11-29 DIAGNOSIS — I132 Hypertensive heart and chronic kidney disease with heart failure and with stage 5 chronic kidney disease, or end stage renal disease: Secondary | ICD-10-CM | POA: Diagnosis not present

## 2015-11-29 DIAGNOSIS — I82432 Acute embolism and thrombosis of left popliteal vein: Secondary | ICD-10-CM | POA: Diagnosis not present

## 2015-11-29 DIAGNOSIS — I509 Heart failure, unspecified: Secondary | ICD-10-CM | POA: Diagnosis not present

## 2015-11-29 DIAGNOSIS — E1122 Type 2 diabetes mellitus with diabetic chronic kidney disease: Secondary | ICD-10-CM | POA: Diagnosis not present

## 2015-11-29 DIAGNOSIS — N186 End stage renal disease: Secondary | ICD-10-CM | POA: Diagnosis not present

## 2015-11-29 DIAGNOSIS — J449 Chronic obstructive pulmonary disease, unspecified: Secondary | ICD-10-CM | POA: Diagnosis not present

## 2015-11-30 DIAGNOSIS — N2581 Secondary hyperparathyroidism of renal origin: Secondary | ICD-10-CM | POA: Diagnosis not present

## 2015-11-30 DIAGNOSIS — D509 Iron deficiency anemia, unspecified: Secondary | ICD-10-CM | POA: Insufficient documentation

## 2015-11-30 DIAGNOSIS — N186 End stage renal disease: Secondary | ICD-10-CM | POA: Diagnosis not present

## 2015-11-30 DIAGNOSIS — D631 Anemia in chronic kidney disease: Secondary | ICD-10-CM | POA: Diagnosis not present

## 2015-12-02 ENCOUNTER — Ambulatory Visit (INDEPENDENT_AMBULATORY_CARE_PROVIDER_SITE_OTHER): Payer: Medicare Other | Admitting: General Practice

## 2015-12-02 ENCOUNTER — Encounter: Payer: Self-pay | Admitting: Internal Medicine

## 2015-12-02 ENCOUNTER — Ambulatory Visit (INDEPENDENT_AMBULATORY_CARE_PROVIDER_SITE_OTHER): Payer: Medicare Other | Admitting: Internal Medicine

## 2015-12-02 ENCOUNTER — Other Ambulatory Visit: Payer: Self-pay | Admitting: Internal Medicine

## 2015-12-02 VITALS — BP 200/62 | HR 105 | Temp 98.5°F | Resp 18 | Ht 72.0 in | Wt 255.0 lb

## 2015-12-02 DIAGNOSIS — I825Z2 Chronic embolism and thrombosis of unspecified deep veins of left distal lower extremity: Secondary | ICD-10-CM

## 2015-12-02 DIAGNOSIS — Z794 Long term (current) use of insulin: Secondary | ICD-10-CM

## 2015-12-02 DIAGNOSIS — N186 End stage renal disease: Secondary | ICD-10-CM | POA: Diagnosis not present

## 2015-12-02 DIAGNOSIS — R042 Hemoptysis: Secondary | ICD-10-CM | POA: Diagnosis not present

## 2015-12-02 DIAGNOSIS — E1121 Type 2 diabetes mellitus with diabetic nephropathy: Secondary | ICD-10-CM | POA: Diagnosis not present

## 2015-12-02 LAB — POCT INR: INR: 2.7

## 2015-12-02 MED ORDER — CLONAZEPAM 0.5 MG PO TABS: 1.0000 mg | ORAL_TABLET | Freq: Every day | ORAL | Status: DC

## 2015-12-02 MED ORDER — WARFARIN SODIUM 5 MG PO TABS: 2.5000 mg | ORAL_TABLET | Freq: Every day | ORAL | Status: DC

## 2015-12-02 MED ORDER — TRAMADOL HCL 50 MG PO TABS: 50.0000 mg | ORAL_TABLET | Freq: Three times a day (TID) | ORAL | Status: DC | PRN

## 2015-12-02 NOTE — Progress Notes (Signed)
I agree with this plan.

## 2015-12-02 NOTE — Progress Notes (Signed)
Pre visit review using our clinic review tool, if applicable. No additional management support is needed unless otherwise documented below in the visit note. 

## 2015-12-02 NOTE — Patient Instructions (Signed)
We have sent in some pain medicine for the wrist to the [facility]. It is called tramadol and you can take 1 pill every 6-8 hours for the pain.   We have sent in the clonazepam and the warfarin to the [facility].

## 2015-12-03 ENCOUNTER — Telehealth: Payer: Self-pay | Admitting: Internal Medicine

## 2015-12-03 DIAGNOSIS — D631 Anemia in chronic kidney disease: Secondary | ICD-10-CM | POA: Diagnosis not present

## 2015-12-03 DIAGNOSIS — N2581 Secondary hyperparathyroidism of renal origin: Secondary | ICD-10-CM | POA: Diagnosis not present

## 2015-12-03 DIAGNOSIS — D509 Iron deficiency anemia, unspecified: Secondary | ICD-10-CM | POA: Diagnosis not present

## 2015-12-03 DIAGNOSIS — N186 End stage renal disease: Secondary | ICD-10-CM | POA: Diagnosis not present

## 2015-12-03 NOTE — Telephone Encounter (Signed)
Refaxed Tramadol to [facility].

## 2015-12-03 NOTE — Telephone Encounter (Signed)
Please call patient back.  He states [facility] did not receive a script.  States he does not know the name of the medication.  I told him that it looked like some of his medications were printed out.  Patient states he did not receive any scripts before he left. Can you please review these medications with him.

## 2015-12-04 ENCOUNTER — Encounter: Payer: Self-pay | Admitting: Cardiovascular Disease

## 2015-12-04 ENCOUNTER — Ambulatory Visit (INDEPENDENT_AMBULATORY_CARE_PROVIDER_SITE_OTHER): Payer: Medicare Other | Admitting: Cardiovascular Disease

## 2015-12-04 VITALS — BP 120/62 | HR 82 | Ht 72.0 in | Wt 254.0 lb

## 2015-12-04 DIAGNOSIS — I110 Hypertensive heart disease with heart failure: Secondary | ICD-10-CM

## 2015-12-04 DIAGNOSIS — I38 Endocarditis, valve unspecified: Secondary | ICD-10-CM | POA: Diagnosis not present

## 2015-12-04 DIAGNOSIS — I5032 Chronic diastolic (congestive) heart failure: Secondary | ICD-10-CM

## 2015-12-04 DIAGNOSIS — N186 End stage renal disease: Secondary | ICD-10-CM

## 2015-12-04 DIAGNOSIS — I351 Nonrheumatic aortic (valve) insufficiency: Secondary | ICD-10-CM | POA: Diagnosis not present

## 2015-12-04 NOTE — Progress Notes (Signed)
Chief Complaint  Patient presents with  . Follow-up  . Hypertension      History of Present Illness: 50 yo [name] male with history of IDDM, HTN, hyperlipidemia, obesity and ESRD on HD, endocarditis, DVT who is here today for cardiac follow up. He is s/p failed kidney transpant in 2009 at [facility] in [place]. I saw him in 2011 for evaluation of chest pain. He is followed by Dr. Jamal Maes at [facility]. He has been almost entirely immobile over the last six years because of his leg weakness, swelling and pain. At the first visit in 2011, I ordered an echo and a [place]. His echo showed moderate LVH c/w hypertensive heart disease. There were no severe valvular abnormalities. His stress myoview showed a defect in the anterior wall, likely artifact. Normal wall motion and no ischemia. This was a low risk study.He was not seen here for 3 years. He was seen in our office June 2016 by Richardson Dopp, PA-C. The patient was admitted to [facility] in 2016 to [place] with lumbar discitis. He was discharged to SNF. He was ultimately readmitted to [facility] and then transferred to [facility] and felt to have discitis. Blood cultures were positive for enterococcus bacteremia. Echocardiogram demonstrated aortic valve endocarditis with severe aortic insufficiency. Surgical correction was recommended after appropriate IV antibiotics but he did not tolerate cardiac catheterization and this was never performed. He missed all appointments at [facility] and follow-ups with the surgeons to schedule his surgery. He followed up with infectious disease at [facility] and blood cultures demonstrated no growth. Echo 04/17/15 with normal LV function and mild to moderate AI. Admitted to [facility] December 2016 with lower extremity DVT and started on coumadin. Repeat echo 11/18/15 with normal LV function, moderate AI, moderate TR, elevated PA pressure.   He is here today for  follow up. He has been doing well.   No chest pain or SOB.  Breathing has been ok. His weight has been stable.   Primary Care Physician: [facility] Nephrology: Dr. Jamal Maes   Past Medical History  Diagnosis Date  . Hypertension   . Leg pain 02/05/11    with walking  . Weight gain   . Shortness of breath     when lying flat  . Diminished eyesight   . Hearing difficulty   . Anemia   . Nervousness(799.21)   . COPD (chronic obstructive pulmonary disease) ([place])   . CHF (congestive heart failure) ([place])   . Posttraumatic stress disorder   . Allergic rhinitis   . Dyslipidemia   . Morbid obesity ([place])   . Gait disorder   . Bilateral carpal tunnel syndrome   . Gastroparesis   . Gout   . Diabetic retinopathy ([place])   . Renal transplant, status post 04/05/2014  . Incomplete bladder emptying   . Urethral stricture   . Aortic insufficiency     a. Echo 7/16:  Mod LVH, EF 50-55%, mild to mod AI, severe LAE, PASP 57 mmHg (LV ID end diastolic Q000111Q mm);  b. Echo 2/17:  Mod LVH, EF 50-55%, mod AI, MAC, mild MR, mod LAE, mild to mod TR, PASP 63 mmHg  . Hyperlipidemia   . Osteomyelitis ([place])   . Endocarditis   . Vitamin D deficiency   . Secondary hyperparathyroidism ([place])   . Rotator cuff disorder   . Discitis of lumbar region     resolved  . Obstructive sleep apnea  not using CPAP- lost 100lbs  . Claustrophobia   . Diabetes mellitus     Type II  . Diabetic peripheral neuropathy ([place])   . GERD (gastroesophageal reflux disease)   . Sepsis ([place])     Postive blood cultures -   . Endocarditis 11/28/14  . Complication of anesthesia     "they said something at [facility] it was affected by sleep aqpnea."  . Chronic kidney disease     Kidney Transplant 2009 09/26/15- transplanated kidney failing  . Chronic renal allograft nephropathy   . Chronic kidney disease (CKD), stage IV (severe) ([place])   . DVT (deep venous thrombosis) ([place]) 10/07/2015    LLE    Past Surgical History    Procedure Laterality Date  . Kidney transplant  2009  . Cataract extraction w/ intraocular lens  implant, bilateral Bilateral   . Av fistula placement Bilateral 2015  . Arteriovenous graft placement Bilateral "several"  . Cystoscopy w/ internal urethrotomy  09/2014  . Bascilic vein transposition Right 09/27/2015  . Bascilic vein transposition Right 09/27/2015    Procedure: BASILIC VEIN TRANSPOSITION right;  Surgeon: Mal Misty, MD;  Location: [facility] OR;  Service: Vascular;  Laterality: Right;    Current Outpatient Prescriptions  Medication Sig Dispense Refill  . BAYER MICROLET LANCETS lancets Use as instructed to check blood sugar 4 times per day dx code E11.9 400 each 1  . BD PEN NEEDLE NANO U/F 32G X 4 MM MISC     . calcium acetate (PHOSLO) 667 MG capsule Take 3 capsules (2,001 mg total) by mouth 3 (three) times daily with meals. 270 capsule 0  . clonazePAM (KLONOPIN) 0.5 MG tablet Take 2 tablets (1 mg total) by mouth at bedtime. 180 tablet 0  . cloNIDine (CATAPRES) 0.3 MG tablet Take 1 tablet (0.3 mg total) by mouth 3 (three) times daily. 270 tablet 1  . Febuxostat (ULORIC) 80 MG TABS Take 80 mg by mouth daily.    . furosemide (LASIX) 80 MG tablet Take 80 mg by mouth daily. Reported on 12/02/2015    . glucose blood (BAYER CONTOUR NEXT TEST) test strip Use as instructed to check blood sugar 4 times per day dx code E11.9 400 each 1  . insulin aspart (NOVOLOG FLEXPEN) 100 UNIT/ML FlexPen Inject 6 Units into the skin 3 (three) times daily with meals. 15 mL 0  . Insulin Glargine (LANTUS) 100 UNIT/ML Solostar Pen Inject 35 Units into the skin daily at 10 pm. 15 mL 0  . Insulin Pen Needle 32G X 8 MM MISC Use as directed 100 each 0  . insulin regular human CONCENTRATED (HUMULIN R) 500 UNIT/ML kwikpen Inject 15 units at breakfast, 25 units at lunch and 20 units before supper 4 pen 1  . labetalol (NORMODYNE) 100 MG tablet TAKE 1 TABLET THREE TIMES DAILY 90 tablet 2  . omeprazole (PRILOSEC) 20 MG  capsule Take 20 mg by mouth daily.    . rosuvastatin (CRESTOR) 20 MG tablet Take 1 tablet (20 mg total) by mouth at bedtime. 90 tablet 3  . tacrolimus (PROGRAF) 1 MG capsule Take 1 capsule (1 mg total) by mouth 2 (two) times daily. 60 capsule 0  . tadalafil (CIALIS) 5 MG tablet TAKE 1 TABLET EVERY DAY    . traMADol (ULTRAM) 50 MG tablet Take 1 tablet (50 mg total) by mouth every 8 (eight) hours as needed. 30 tablet 0  . warfarin (COUMADIN) 5 MG tablet Take 0.5-1 tablets (2.5-5 mg total) by mouth  daily at 6 PM. Take 1/2 tablet (2.5mg  on Monday and Friday, whole tablet (5mg ) other days. 90 tablet 3   No current facility-administered medications for this visit.    Allergies  Allergen Reactions  . Pork-Derived Products Anaphylaxis and Other (See Comments)    Fever also   . Ace Inhibitors Cough  . Hydrocodone Other (See Comments)    Hallucinations    Social History   Social History  . Marital Status: Divorced    Spouse Name: N/A  . Number of Children: 0  . Years of Education: College    Occupational History  .     Social History Main Topics  . Smoking status: Never Smoker   . Smokeless tobacco: Never Used  . Alcohol Use: No  . Drug Use: No  . Sexual Activity: Not Currently   Other Topics Concern  . Not on file   Social History Narrative   Patient is Divorced.   Patient does not have any children.   Patient is right-handed   Patient is currently in college working on his Ph.D.          Family History  Problem Relation Age of Onset  . Hypertension Mother   . Diabetes Father     Review of Systems:  As stated in the HPI and otherwise negative.   BP 120/62 mmHg  Pulse 82  Ht 6' (1.829 m)  Wt 254 lb (115.214 kg)  BMI 34.44 kg/m2  SpO2 98%  Physical Examination: General: Well developed, well nourished, NAD HEENT: OP clear, mucus membranes moist SKIN: warm, dry. No rashes. Neuro: No focal deficits Musculoskeletal: Muscle strength 5/5 all ext Psychiatric: Mood  and affect normal Neck: No JVD, no carotid bruits, no thyromegaly, no lymphadenopathy. Lungs:Clear bilaterally, no wheezes, rhonci, crackles Cardiovascular: Regular rate and rhythm. No murmurs, gallops or rubs. Abdomen:Distended. Firm. Bowel sounds present. Non-tender.  Extremities: No lower extremity edema. Pulses are 2 + in the bilateral DP/PT.  TEE 11/2014 at [place] Left ventricular systolic function is normal. Small echodensity (vegetation) seen on the ventricular side of the left cusp of the aortic valve with partial flail and associated with severe eccentric, aortic regurgitation. Very short PHT 196 msc, and jet occupys > 65% of LVOT There is trace mitral regurgitation. There is no vegetation seen on the mitral valve. There is mild tricuspid regurgitation. There is no tricuspid valve vegetation. Moderate, focal atherosclerotic plaque(s) in the ascending aorta.There is no pericardial effusion.  Echo 11/18/15: Left ventricle: The cavity size was mildly dilated. Wall thickness was increased in a pattern of moderate LVH. Systolic function was normal. The estimated ejection fraction was in the range of 50% to 55%. Doppler parameters are consistent with elevated ventricular end-diastolic filling pressure. - Aortic valve: There was moderate regurgitation. - Mitral valve: Calcified annulus. Mildly thickened leaflets . There was mild regurgitation. - Left atrium: The atrium was moderately dilated. - Atrial septum: No defect or patent foramen ovale was identified. - Tricuspid valve: There was mild-moderate regurgitation. - Pulmonary arteries: PA peak pressure: 63 mm Hg (S).   EKG:  EKG is not ordered today. The ekg ordered today demonstrates   Recent Labs: 11/15/2015: B Natriuretic Peptide 2326.4* 11/17/2015: Hemoglobin 9.6*; Platelets 156 11/22/2015: ALT 6; BUN 25*; Creatinine, Ser 2.87*; Potassium 4.0; Sodium 141    Wt Readings from Last 3 Encounters:  12/04/15 254 lb  (115.214 kg)  12/02/15 255 lb (115.667 kg)  11/27/15 256 lb 3.2 oz (116.212 kg)  Other studies Reviewed: Additional studies/ records that were reviewed today include: . Review of the above records demonstrates:    Assessment and Plan:   1. Aortic insufficiency: Patient was noted to have severe aortic insufficiency at the time of TEE in February 2016 during his hospitalization for enterococcal bacteremia at [facility]. Surgery was recommended, however, he declined and failed to follow-up with the surgeons at [facility]. Echo 11/18/15 with stable moderate AI. He has no signs of heart failure. Follow for now. Will repeat echo in 12 months.   2. Endocarditis: He underwent treatment with IV antibiotics for 6 weeks. He last saw infectious disease at [facility] in May 2016 and was released.   3. Hypertensive heart disease with heart failure : Blood pressure managed by nephrology and well controlled.   4. Chronic diastolic CHF (congestive heart failure): Volume managed by nephrology. Weight stable.   5. HLD (hyperlipidemia): He remains on statin therapy.  6. End stage renal disease: He is now on HD. Marland Kitchen  Current medicines are reviewed at length with the patient today.  The patient does not have concerns regarding medicines.  The following changes have been made:  no change  Labs/ tests ordered today include:   Orders Placed This Encounter  Procedures  . Echocardiogram     Disposition:   FU with me in 6 months   Signed, Lauree Chandler, MD 12/04/2015 12:13 PM    [facility] [address], [place], [place]  [address] Phone: [phone]; Fax: [phone]

## 2015-12-04 NOTE — Patient Instructions (Signed)
Medication Instructions:  Your physician recommends that you continue on your current medications as directed. Please refer to the Current Medication list given to you today.   Labwork: none  Testing/Procedures:  Your physician has requested that you have an echocardiogram. Echocardiography is a painless test that uses sound waves to create images of your heart. It provides your doctor with information about the size and shape of your heart and how well your heart's chambers and valves are working. This procedure takes approximately one hour. There are no restrictions for this procedure.  To be done in 12 months--week or so prior to appointment with Dr. Angelena Form    Follow-Up: Your physician wants you to follow-up in: 12 months.  You will receive a reminder letter in the mail two months in advance. If you don't receive a letter, please call our office to schedule the follow-up appointment.   Any Other Special Instructions Will Be Listed Below (If Applicable).     If you need a refill on your cardiac medications before your next appointment, please call your pharmacy.

## 2015-12-05 DIAGNOSIS — N2581 Secondary hyperparathyroidism of renal origin: Secondary | ICD-10-CM | POA: Diagnosis not present

## 2015-12-05 DIAGNOSIS — D509 Iron deficiency anemia, unspecified: Secondary | ICD-10-CM | POA: Diagnosis not present

## 2015-12-05 DIAGNOSIS — N186 End stage renal disease: Secondary | ICD-10-CM | POA: Diagnosis not present

## 2015-12-05 DIAGNOSIS — D631 Anemia in chronic kidney disease: Secondary | ICD-10-CM | POA: Diagnosis not present

## 2015-12-05 DIAGNOSIS — M109 Gout, unspecified: Secondary | ICD-10-CM | POA: Diagnosis not present

## 2015-12-06 ENCOUNTER — Telehealth: Payer: Self-pay | Admitting: *Deleted

## 2015-12-06 ENCOUNTER — Telehealth: Payer: Self-pay | Admitting: General Practice

## 2015-12-06 ENCOUNTER — Ambulatory Visit (INDEPENDENT_AMBULATORY_CARE_PROVIDER_SITE_OTHER): Payer: Medicare Other | Admitting: General Practice

## 2015-12-06 ENCOUNTER — Encounter: Payer: Self-pay | Admitting: Internal Medicine

## 2015-12-06 DIAGNOSIS — I509 Heart failure, unspecified: Secondary | ICD-10-CM | POA: Diagnosis not present

## 2015-12-06 DIAGNOSIS — N186 End stage renal disease: Secondary | ICD-10-CM | POA: Diagnosis not present

## 2015-12-06 DIAGNOSIS — I82432 Acute embolism and thrombosis of left popliteal vein: Secondary | ICD-10-CM | POA: Diagnosis not present

## 2015-12-06 DIAGNOSIS — J449 Chronic obstructive pulmonary disease, unspecified: Secondary | ICD-10-CM | POA: Diagnosis not present

## 2015-12-06 DIAGNOSIS — E1122 Type 2 diabetes mellitus with diabetic chronic kidney disease: Secondary | ICD-10-CM | POA: Diagnosis not present

## 2015-12-06 DIAGNOSIS — I132 Hypertensive heart and chronic kidney disease with heart failure and with stage 5 chronic kidney disease, or end stage renal disease: Secondary | ICD-10-CM | POA: Diagnosis not present

## 2015-12-06 LAB — POCT INR: INR: 1.7

## 2015-12-06 NOTE — Telephone Encounter (Signed)
Receive call from [facility] w/[employer] needing to verify that MD sent Klonopin rx from office. Inform Merry Proud yes rx was fax from office 12/02/15...[name] [name]

## 2015-12-06 NOTE — Progress Notes (Signed)
Pre visit review using our clinic review tool, if applicable. No additional management support is needed unless otherwise documented below in the visit note. 

## 2015-12-06 NOTE — Assessment & Plan Note (Signed)
He is doing okay with his sugars and denies high or low sugars. Regimen adjusted slightly in the hospital and he is doing fine with that. He uses sliding scale based on values. Trying to eat food good for his sugars.

## 2015-12-06 NOTE — Assessment & Plan Note (Signed)
Having some pain below his access site on right arm, asked him to talk to his vascular doctor about it. Pulses intact at the wrist on exam today and no cyanosis or numbness.

## 2015-12-06 NOTE — Assessment & Plan Note (Signed)
Taking coumadin for anticoagulation. No recurrent bleeding since leaving the hospital.

## 2015-12-06 NOTE — Progress Notes (Signed)
I have reviewed and agree with the plan. 

## 2015-12-06 NOTE — Telephone Encounter (Signed)
VO given to [facility] to fax INR results to Villa Herb, RN @ [phone].  VORB

## 2015-12-06 NOTE — Assessment & Plan Note (Signed)
No recurrence with treatment. He does not feel need for another CXR as recommend by hospitalist. Given that he just had CT chest feel that this can wait. If any recurrent would evaluate. His coumadin makes him more likely to have small bleeding.

## 2015-12-06 NOTE — Progress Notes (Signed)
   Subjective:    Patient ID: [name] [name], male    DOB: 1966-04-04, 50 y.o.   MRN: [id number]  HPI The patient is a 50 YO man coming in for hospital follow up (in for cough with hemoptysis and ruled out PE, treated for pneumonia). He is doing better and breathing is well. No more sputum and no more blood. Some mild sinus problems still and taking over the counter medicine for that with good relief. He is having some pain in his right hand under the vascular access. Still doing dialysis without problems. No chest pains or SOB. No constipation although had some diarrhea with the antibiotics. Now getting better.   PMH, [facility], social history reviewed and updated.   Review of Systems  Constitutional: Positive for fatigue. Negative for fever, chills, activity change and appetite change.  HENT: Negative.   Eyes: Negative.   Respiratory: Negative for cough, chest tightness, shortness of breath and wheezing.        No hemoptysis  Cardiovascular: Negative for chest pain, palpitations and leg swelling.  Gastrointestinal: Negative for nausea, abdominal pain, diarrhea, constipation and abdominal distention.  Musculoskeletal: Positive for myalgias, back pain, arthralgias and gait problem.  Skin: Negative.   Neurological: Positive for weakness and numbness. Negative for dizziness, syncope, light-headedness and headaches.  Psychiatric/Behavioral: Negative.       Objective:   Physical Exam  Constitutional: He is oriented to person, place, and time. He appears well-developed and well-nourished.  Morbidly obese, uses scooter for ambulation.   HENT:  Head: Normocephalic and atraumatic.  Eyes: EOM are normal.  Neck: Normal range of motion. No JVD present.  Cardiovascular: Normal rate.   Pulmonary/Chest: Effort normal and breath sounds normal. No respiratory distress. He has no wheezes.  Abdominal: Soft. Bowel sounds are normal. He exhibits no distension. There is no tenderness. There is no rebound.    Musculoskeletal: He exhibits no edema.  Neurological: He is alert and oriented to person, place, and time. Coordination abnormal.  Skin: Skin is warm and dry.  Psychiatric: He has a normal mood and affect.   Filed Vitals:   12/02/15 1320  BP: 200/62  Pulse: 105  Temp: 98.5 F (36.9 C)  TempSrc: Oral  Resp: 18  Height: 6' (1.829 m)  Weight: 255 lb (115.667 kg)  SpO2: 98%      Assessment & Plan:

## 2015-12-07 DIAGNOSIS — D509 Iron deficiency anemia, unspecified: Secondary | ICD-10-CM | POA: Diagnosis not present

## 2015-12-07 DIAGNOSIS — N2581 Secondary hyperparathyroidism of renal origin: Secondary | ICD-10-CM | POA: Diagnosis not present

## 2015-12-07 DIAGNOSIS — N186 End stage renal disease: Secondary | ICD-10-CM | POA: Diagnosis not present

## 2015-12-07 DIAGNOSIS — D631 Anemia in chronic kidney disease: Secondary | ICD-10-CM | POA: Diagnosis not present

## 2015-12-09 DIAGNOSIS — Z9489 Other transplanted organ and tissue status: Secondary | ICD-10-CM | POA: Diagnosis not present

## 2015-12-09 DIAGNOSIS — N186 End stage renal disease: Secondary | ICD-10-CM | POA: Diagnosis not present

## 2015-12-09 DIAGNOSIS — N35013 Post-traumatic anterior urethral stricture: Secondary | ICD-10-CM | POA: Diagnosis not present

## 2015-12-09 DIAGNOSIS — N400 Enlarged prostate without lower urinary tract symptoms: Secondary | ICD-10-CM | POA: Diagnosis not present

## 2015-12-09 DIAGNOSIS — N35914 Unspecified anterior urethral stricture, male: Secondary | ICD-10-CM | POA: Insufficient documentation

## 2015-12-10 DIAGNOSIS — D631 Anemia in chronic kidney disease: Secondary | ICD-10-CM | POA: Diagnosis not present

## 2015-12-10 DIAGNOSIS — D509 Iron deficiency anemia, unspecified: Secondary | ICD-10-CM | POA: Diagnosis not present

## 2015-12-10 DIAGNOSIS — Z992 Dependence on renal dialysis: Secondary | ICD-10-CM | POA: Diagnosis not present

## 2015-12-10 DIAGNOSIS — E1122 Type 2 diabetes mellitus with diabetic chronic kidney disease: Secondary | ICD-10-CM | POA: Diagnosis not present

## 2015-12-10 DIAGNOSIS — N186 End stage renal disease: Secondary | ICD-10-CM | POA: Diagnosis not present

## 2015-12-10 DIAGNOSIS — N2581 Secondary hyperparathyroidism of renal origin: Secondary | ICD-10-CM | POA: Diagnosis not present

## 2015-12-12 DIAGNOSIS — Z23 Encounter for immunization: Secondary | ICD-10-CM | POA: Diagnosis not present

## 2015-12-12 DIAGNOSIS — D509 Iron deficiency anemia, unspecified: Secondary | ICD-10-CM | POA: Diagnosis not present

## 2015-12-12 DIAGNOSIS — N2581 Secondary hyperparathyroidism of renal origin: Secondary | ICD-10-CM | POA: Diagnosis not present

## 2015-12-12 DIAGNOSIS — N186 End stage renal disease: Secondary | ICD-10-CM | POA: Diagnosis not present

## 2015-12-12 DIAGNOSIS — E1129 Type 2 diabetes mellitus with other diabetic kidney complication: Secondary | ICD-10-CM | POA: Diagnosis not present

## 2015-12-12 DIAGNOSIS — D631 Anemia in chronic kidney disease: Secondary | ICD-10-CM | POA: Diagnosis not present

## 2015-12-12 DIAGNOSIS — I82492 Acute embolism and thrombosis of other specified deep vein of left lower extremity: Secondary | ICD-10-CM | POA: Diagnosis not present

## 2015-12-12 LAB — PROTIME-INR: INR: 1.7 — AB (ref <=1.1)

## 2015-12-14 DIAGNOSIS — N2581 Secondary hyperparathyroidism of renal origin: Secondary | ICD-10-CM | POA: Diagnosis not present

## 2015-12-14 DIAGNOSIS — E1129 Type 2 diabetes mellitus with other diabetic kidney complication: Secondary | ICD-10-CM | POA: Diagnosis not present

## 2015-12-14 DIAGNOSIS — Z23 Encounter for immunization: Secondary | ICD-10-CM | POA: Diagnosis not present

## 2015-12-14 DIAGNOSIS — N186 End stage renal disease: Secondary | ICD-10-CM | POA: Diagnosis not present

## 2015-12-14 DIAGNOSIS — D509 Iron deficiency anemia, unspecified: Secondary | ICD-10-CM | POA: Diagnosis not present

## 2015-12-14 DIAGNOSIS — D631 Anemia in chronic kidney disease: Secondary | ICD-10-CM | POA: Diagnosis not present

## 2015-12-16 ENCOUNTER — Ambulatory Visit (INDEPENDENT_AMBULATORY_CARE_PROVIDER_SITE_OTHER): Payer: Medicare Other | Admitting: General Practice

## 2015-12-16 NOTE — Progress Notes (Signed)
Pre visit review using our clinic review tool, if applicable. No additional management support is needed unless otherwise documented below in the visit note. 

## 2015-12-16 NOTE — Progress Notes (Signed)
agree with this plan

## 2015-12-17 DIAGNOSIS — Z23 Encounter for immunization: Secondary | ICD-10-CM | POA: Diagnosis not present

## 2015-12-17 DIAGNOSIS — E1129 Type 2 diabetes mellitus with other diabetic kidney complication: Secondary | ICD-10-CM | POA: Diagnosis not present

## 2015-12-17 DIAGNOSIS — D509 Iron deficiency anemia, unspecified: Secondary | ICD-10-CM | POA: Diagnosis not present

## 2015-12-17 DIAGNOSIS — N2581 Secondary hyperparathyroidism of renal origin: Secondary | ICD-10-CM | POA: Diagnosis not present

## 2015-12-17 DIAGNOSIS — N186 End stage renal disease: Secondary | ICD-10-CM | POA: Diagnosis not present

## 2015-12-17 DIAGNOSIS — D631 Anemia in chronic kidney disease: Secondary | ICD-10-CM | POA: Diagnosis not present

## 2015-12-19 ENCOUNTER — Other Ambulatory Visit: Payer: Self-pay | Admitting: Endocrinology

## 2015-12-19 DIAGNOSIS — D509 Iron deficiency anemia, unspecified: Secondary | ICD-10-CM | POA: Diagnosis not present

## 2015-12-19 DIAGNOSIS — D631 Anemia in chronic kidney disease: Secondary | ICD-10-CM | POA: Diagnosis not present

## 2015-12-19 DIAGNOSIS — Z23 Encounter for immunization: Secondary | ICD-10-CM | POA: Diagnosis not present

## 2015-12-19 DIAGNOSIS — N2581 Secondary hyperparathyroidism of renal origin: Secondary | ICD-10-CM | POA: Diagnosis not present

## 2015-12-19 DIAGNOSIS — N186 End stage renal disease: Secondary | ICD-10-CM | POA: Diagnosis not present

## 2015-12-19 DIAGNOSIS — E1129 Type 2 diabetes mellitus with other diabetic kidney complication: Secondary | ICD-10-CM | POA: Diagnosis not present

## 2015-12-19 DIAGNOSIS — I82492 Acute embolism and thrombosis of other specified deep vein of left lower extremity: Secondary | ICD-10-CM | POA: Diagnosis not present

## 2015-12-19 LAB — PROTIME-INR: INR: 1.7 — AB (ref 0.9–1.1)

## 2015-12-20 DIAGNOSIS — I132 Hypertensive heart and chronic kidney disease with heart failure and with stage 5 chronic kidney disease, or end stage renal disease: Secondary | ICD-10-CM | POA: Diagnosis not present

## 2015-12-20 DIAGNOSIS — I509 Heart failure, unspecified: Secondary | ICD-10-CM | POA: Diagnosis not present

## 2015-12-20 DIAGNOSIS — I82432 Acute embolism and thrombosis of left popliteal vein: Secondary | ICD-10-CM | POA: Diagnosis not present

## 2015-12-20 DIAGNOSIS — J449 Chronic obstructive pulmonary disease, unspecified: Secondary | ICD-10-CM | POA: Diagnosis not present

## 2015-12-20 DIAGNOSIS — N186 End stage renal disease: Secondary | ICD-10-CM | POA: Diagnosis not present

## 2015-12-20 DIAGNOSIS — E1122 Type 2 diabetes mellitus with diabetic chronic kidney disease: Secondary | ICD-10-CM | POA: Diagnosis not present

## 2015-12-21 DIAGNOSIS — N2581 Secondary hyperparathyroidism of renal origin: Secondary | ICD-10-CM | POA: Diagnosis not present

## 2015-12-21 DIAGNOSIS — E1129 Type 2 diabetes mellitus with other diabetic kidney complication: Secondary | ICD-10-CM | POA: Diagnosis not present

## 2015-12-21 DIAGNOSIS — N186 End stage renal disease: Secondary | ICD-10-CM | POA: Diagnosis not present

## 2015-12-21 DIAGNOSIS — D631 Anemia in chronic kidney disease: Secondary | ICD-10-CM | POA: Diagnosis not present

## 2015-12-21 DIAGNOSIS — D509 Iron deficiency anemia, unspecified: Secondary | ICD-10-CM | POA: Diagnosis not present

## 2015-12-21 DIAGNOSIS — Z23 Encounter for immunization: Secondary | ICD-10-CM | POA: Diagnosis not present

## 2015-12-24 DIAGNOSIS — E1129 Type 2 diabetes mellitus with other diabetic kidney complication: Secondary | ICD-10-CM | POA: Diagnosis not present

## 2015-12-24 DIAGNOSIS — D509 Iron deficiency anemia, unspecified: Secondary | ICD-10-CM | POA: Diagnosis not present

## 2015-12-24 DIAGNOSIS — Z23 Encounter for immunization: Secondary | ICD-10-CM | POA: Diagnosis not present

## 2015-12-24 DIAGNOSIS — N186 End stage renal disease: Secondary | ICD-10-CM | POA: Diagnosis not present

## 2015-12-24 DIAGNOSIS — N2581 Secondary hyperparathyroidism of renal origin: Secondary | ICD-10-CM | POA: Diagnosis not present

## 2015-12-24 DIAGNOSIS — D631 Anemia in chronic kidney disease: Secondary | ICD-10-CM | POA: Diagnosis not present

## 2015-12-26 DIAGNOSIS — N2581 Secondary hyperparathyroidism of renal origin: Secondary | ICD-10-CM | POA: Diagnosis not present

## 2015-12-26 DIAGNOSIS — N186 End stage renal disease: Secondary | ICD-10-CM | POA: Diagnosis not present

## 2015-12-26 DIAGNOSIS — D509 Iron deficiency anemia, unspecified: Secondary | ICD-10-CM | POA: Diagnosis not present

## 2015-12-26 DIAGNOSIS — E1129 Type 2 diabetes mellitus with other diabetic kidney complication: Secondary | ICD-10-CM | POA: Diagnosis not present

## 2015-12-26 DIAGNOSIS — D631 Anemia in chronic kidney disease: Secondary | ICD-10-CM | POA: Diagnosis not present

## 2015-12-26 DIAGNOSIS — Z23 Encounter for immunization: Secondary | ICD-10-CM | POA: Diagnosis not present

## 2015-12-26 DIAGNOSIS — I82492 Acute embolism and thrombosis of other specified deep vein of left lower extremity: Secondary | ICD-10-CM | POA: Diagnosis not present

## 2015-12-26 LAB — POCT INR: INR: 1.96

## 2015-12-28 DIAGNOSIS — D631 Anemia in chronic kidney disease: Secondary | ICD-10-CM | POA: Diagnosis not present

## 2015-12-28 DIAGNOSIS — D509 Iron deficiency anemia, unspecified: Secondary | ICD-10-CM | POA: Diagnosis not present

## 2015-12-28 DIAGNOSIS — N2581 Secondary hyperparathyroidism of renal origin: Secondary | ICD-10-CM | POA: Diagnosis not present

## 2015-12-28 DIAGNOSIS — E1129 Type 2 diabetes mellitus with other diabetic kidney complication: Secondary | ICD-10-CM | POA: Diagnosis not present

## 2015-12-28 DIAGNOSIS — N186 End stage renal disease: Secondary | ICD-10-CM | POA: Diagnosis not present

## 2015-12-28 DIAGNOSIS — Z23 Encounter for immunization: Secondary | ICD-10-CM | POA: Diagnosis not present

## 2015-12-31 DIAGNOSIS — N2581 Secondary hyperparathyroidism of renal origin: Secondary | ICD-10-CM | POA: Diagnosis not present

## 2015-12-31 DIAGNOSIS — D509 Iron deficiency anemia, unspecified: Secondary | ICD-10-CM | POA: Diagnosis not present

## 2015-12-31 DIAGNOSIS — E1129 Type 2 diabetes mellitus with other diabetic kidney complication: Secondary | ICD-10-CM | POA: Diagnosis not present

## 2015-12-31 DIAGNOSIS — N186 End stage renal disease: Secondary | ICD-10-CM | POA: Diagnosis not present

## 2015-12-31 DIAGNOSIS — Z23 Encounter for immunization: Secondary | ICD-10-CM | POA: Diagnosis not present

## 2015-12-31 DIAGNOSIS — D631 Anemia in chronic kidney disease: Secondary | ICD-10-CM | POA: Diagnosis not present

## 2016-01-01 ENCOUNTER — Ambulatory Visit (INDEPENDENT_AMBULATORY_CARE_PROVIDER_SITE_OTHER): Payer: Medicare Other | Admitting: General Practice

## 2016-01-01 NOTE — Progress Notes (Signed)
Pre visit review using our clinic review tool, if applicable. No additional management support is needed unless otherwise documented below in the visit note. 

## 2016-01-01 NOTE — Progress Notes (Signed)
I have reviewed and agree with the plan. 

## 2016-01-02 DIAGNOSIS — N186 End stage renal disease: Secondary | ICD-10-CM | POA: Diagnosis not present

## 2016-01-02 DIAGNOSIS — D509 Iron deficiency anemia, unspecified: Secondary | ICD-10-CM | POA: Diagnosis not present

## 2016-01-02 DIAGNOSIS — D631 Anemia in chronic kidney disease: Secondary | ICD-10-CM | POA: Diagnosis not present

## 2016-01-02 DIAGNOSIS — N2581 Secondary hyperparathyroidism of renal origin: Secondary | ICD-10-CM | POA: Diagnosis not present

## 2016-01-02 DIAGNOSIS — M109 Gout, unspecified: Secondary | ICD-10-CM | POA: Diagnosis not present

## 2016-01-02 DIAGNOSIS — Z23 Encounter for immunization: Secondary | ICD-10-CM | POA: Diagnosis not present

## 2016-01-02 DIAGNOSIS — E1129 Type 2 diabetes mellitus with other diabetic kidney complication: Secondary | ICD-10-CM | POA: Diagnosis not present

## 2016-01-03 LAB — PROTIME-INR: INR: 2.4 — AB (ref <=1.1)

## 2016-01-04 DIAGNOSIS — N2581 Secondary hyperparathyroidism of renal origin: Secondary | ICD-10-CM | POA: Diagnosis not present

## 2016-01-04 DIAGNOSIS — N186 End stage renal disease: Secondary | ICD-10-CM | POA: Diagnosis not present

## 2016-01-04 DIAGNOSIS — D631 Anemia in chronic kidney disease: Secondary | ICD-10-CM | POA: Diagnosis not present

## 2016-01-04 DIAGNOSIS — D509 Iron deficiency anemia, unspecified: Secondary | ICD-10-CM | POA: Diagnosis not present

## 2016-01-04 DIAGNOSIS — Z23 Encounter for immunization: Secondary | ICD-10-CM | POA: Diagnosis not present

## 2016-01-04 DIAGNOSIS — E1129 Type 2 diabetes mellitus with other diabetic kidney complication: Secondary | ICD-10-CM | POA: Diagnosis not present

## 2016-01-07 DIAGNOSIS — E1129 Type 2 diabetes mellitus with other diabetic kidney complication: Secondary | ICD-10-CM | POA: Diagnosis not present

## 2016-01-07 DIAGNOSIS — D631 Anemia in chronic kidney disease: Secondary | ICD-10-CM | POA: Diagnosis not present

## 2016-01-07 DIAGNOSIS — Z23 Encounter for immunization: Secondary | ICD-10-CM | POA: Diagnosis not present

## 2016-01-07 DIAGNOSIS — N2581 Secondary hyperparathyroidism of renal origin: Secondary | ICD-10-CM | POA: Diagnosis not present

## 2016-01-07 DIAGNOSIS — N186 End stage renal disease: Secondary | ICD-10-CM | POA: Diagnosis not present

## 2016-01-07 DIAGNOSIS — D509 Iron deficiency anemia, unspecified: Secondary | ICD-10-CM | POA: Diagnosis not present

## 2016-01-08 ENCOUNTER — Ambulatory Visit: Payer: Self-pay | Admitting: Endocrinology

## 2016-01-08 ENCOUNTER — Ambulatory Visit (INDEPENDENT_AMBULATORY_CARE_PROVIDER_SITE_OTHER): Payer: Medicare Other | Admitting: General Practice

## 2016-01-08 NOTE — Progress Notes (Signed)
Pre visit review using our clinic review tool, if applicable. No additional management support is needed unless otherwise documented below in the visit note. 

## 2016-01-08 NOTE — Progress Notes (Signed)
I have reviewed and agree with the plan. 

## 2016-01-09 DIAGNOSIS — D631 Anemia in chronic kidney disease: Secondary | ICD-10-CM | POA: Diagnosis not present

## 2016-01-09 DIAGNOSIS — Z23 Encounter for immunization: Secondary | ICD-10-CM | POA: Diagnosis not present

## 2016-01-09 DIAGNOSIS — N186 End stage renal disease: Secondary | ICD-10-CM | POA: Diagnosis not present

## 2016-01-09 DIAGNOSIS — D509 Iron deficiency anemia, unspecified: Secondary | ICD-10-CM | POA: Diagnosis not present

## 2016-01-09 DIAGNOSIS — I82492 Acute embolism and thrombosis of other specified deep vein of left lower extremity: Secondary | ICD-10-CM | POA: Diagnosis not present

## 2016-01-09 DIAGNOSIS — E1129 Type 2 diabetes mellitus with other diabetic kidney complication: Secondary | ICD-10-CM | POA: Diagnosis not present

## 2016-01-09 DIAGNOSIS — N2581 Secondary hyperparathyroidism of renal origin: Secondary | ICD-10-CM | POA: Diagnosis not present

## 2016-01-10 DIAGNOSIS — Z992 Dependence on renal dialysis: Secondary | ICD-10-CM | POA: Diagnosis not present

## 2016-01-10 DIAGNOSIS — G5601 Carpal tunnel syndrome, right upper limb: Secondary | ICD-10-CM | POA: Diagnosis not present

## 2016-01-10 DIAGNOSIS — E1122 Type 2 diabetes mellitus with diabetic chronic kidney disease: Secondary | ICD-10-CM | POA: Diagnosis not present

## 2016-01-10 DIAGNOSIS — N186 End stage renal disease: Secondary | ICD-10-CM | POA: Diagnosis not present

## 2016-01-11 DIAGNOSIS — D509 Iron deficiency anemia, unspecified: Secondary | ICD-10-CM | POA: Diagnosis not present

## 2016-01-11 DIAGNOSIS — D631 Anemia in chronic kidney disease: Secondary | ICD-10-CM | POA: Diagnosis not present

## 2016-01-11 DIAGNOSIS — N186 End stage renal disease: Secondary | ICD-10-CM | POA: Diagnosis not present

## 2016-01-11 DIAGNOSIS — Z23 Encounter for immunization: Secondary | ICD-10-CM | POA: Diagnosis not present

## 2016-01-11 DIAGNOSIS — E1122 Type 2 diabetes mellitus with diabetic chronic kidney disease: Secondary | ICD-10-CM | POA: Diagnosis not present

## 2016-01-11 DIAGNOSIS — N2581 Secondary hyperparathyroidism of renal origin: Secondary | ICD-10-CM | POA: Diagnosis not present

## 2016-01-13 DIAGNOSIS — G5601 Carpal tunnel syndrome, right upper limb: Secondary | ICD-10-CM | POA: Diagnosis not present

## 2016-01-14 DIAGNOSIS — N186 End stage renal disease: Secondary | ICD-10-CM | POA: Diagnosis not present

## 2016-01-14 DIAGNOSIS — D509 Iron deficiency anemia, unspecified: Secondary | ICD-10-CM | POA: Diagnosis not present

## 2016-01-14 DIAGNOSIS — Z23 Encounter for immunization: Secondary | ICD-10-CM | POA: Diagnosis not present

## 2016-01-14 DIAGNOSIS — E1122 Type 2 diabetes mellitus with diabetic chronic kidney disease: Secondary | ICD-10-CM | POA: Diagnosis not present

## 2016-01-14 DIAGNOSIS — N2581 Secondary hyperparathyroidism of renal origin: Secondary | ICD-10-CM | POA: Diagnosis not present

## 2016-01-14 DIAGNOSIS — D631 Anemia in chronic kidney disease: Secondary | ICD-10-CM | POA: Diagnosis not present

## 2016-01-15 DIAGNOSIS — Z452 Encounter for adjustment and management of vascular access device: Secondary | ICD-10-CM | POA: Diagnosis not present

## 2016-01-16 DIAGNOSIS — N186 End stage renal disease: Secondary | ICD-10-CM | POA: Diagnosis not present

## 2016-01-16 DIAGNOSIS — D509 Iron deficiency anemia, unspecified: Secondary | ICD-10-CM | POA: Diagnosis not present

## 2016-01-16 DIAGNOSIS — D631 Anemia in chronic kidney disease: Secondary | ICD-10-CM | POA: Diagnosis not present

## 2016-01-16 DIAGNOSIS — Z23 Encounter for immunization: Secondary | ICD-10-CM | POA: Diagnosis not present

## 2016-01-16 DIAGNOSIS — N2581 Secondary hyperparathyroidism of renal origin: Secondary | ICD-10-CM | POA: Diagnosis not present

## 2016-01-16 DIAGNOSIS — E1122 Type 2 diabetes mellitus with diabetic chronic kidney disease: Secondary | ICD-10-CM | POA: Diagnosis not present

## 2016-01-16 DIAGNOSIS — I82492 Acute embolism and thrombosis of other specified deep vein of left lower extremity: Secondary | ICD-10-CM | POA: Diagnosis not present

## 2016-01-16 LAB — PROTIME-INR: INR: 2 — AB (ref <=1.1)

## 2016-01-18 DIAGNOSIS — N2581 Secondary hyperparathyroidism of renal origin: Secondary | ICD-10-CM | POA: Diagnosis not present

## 2016-01-18 DIAGNOSIS — D509 Iron deficiency anemia, unspecified: Secondary | ICD-10-CM | POA: Diagnosis not present

## 2016-01-18 DIAGNOSIS — N186 End stage renal disease: Secondary | ICD-10-CM | POA: Diagnosis not present

## 2016-01-18 DIAGNOSIS — D631 Anemia in chronic kidney disease: Secondary | ICD-10-CM | POA: Diagnosis not present

## 2016-01-18 DIAGNOSIS — E1122 Type 2 diabetes mellitus with diabetic chronic kidney disease: Secondary | ICD-10-CM | POA: Diagnosis not present

## 2016-01-18 DIAGNOSIS — Z23 Encounter for immunization: Secondary | ICD-10-CM | POA: Diagnosis not present

## 2016-01-22 ENCOUNTER — Ambulatory Visit (INDEPENDENT_AMBULATORY_CARE_PROVIDER_SITE_OTHER): Payer: Medicare Other | Admitting: General Practice

## 2016-01-22 NOTE — Progress Notes (Signed)
Pre visit review using our clinic review tool, if applicable. No additional management support is needed unless otherwise documented below in the visit note. 

## 2016-02-04 DIAGNOSIS — E1122 Type 2 diabetes mellitus with diabetic chronic kidney disease: Secondary | ICD-10-CM | POA: Diagnosis not present

## 2016-02-04 DIAGNOSIS — N2581 Secondary hyperparathyroidism of renal origin: Secondary | ICD-10-CM | POA: Diagnosis not present

## 2016-02-04 DIAGNOSIS — Z23 Encounter for immunization: Secondary | ICD-10-CM | POA: Diagnosis not present

## 2016-02-04 DIAGNOSIS — N186 End stage renal disease: Secondary | ICD-10-CM | POA: Diagnosis not present

## 2016-02-04 DIAGNOSIS — D509 Iron deficiency anemia, unspecified: Secondary | ICD-10-CM | POA: Diagnosis not present

## 2016-02-04 DIAGNOSIS — D631 Anemia in chronic kidney disease: Secondary | ICD-10-CM | POA: Diagnosis not present

## 2016-02-06 DIAGNOSIS — M109 Gout, unspecified: Secondary | ICD-10-CM | POA: Diagnosis not present

## 2016-02-06 DIAGNOSIS — Z23 Encounter for immunization: Secondary | ICD-10-CM | POA: Diagnosis not present

## 2016-02-06 DIAGNOSIS — N186 End stage renal disease: Secondary | ICD-10-CM | POA: Diagnosis not present

## 2016-02-06 DIAGNOSIS — D631 Anemia in chronic kidney disease: Secondary | ICD-10-CM | POA: Diagnosis not present

## 2016-02-06 DIAGNOSIS — D509 Iron deficiency anemia, unspecified: Secondary | ICD-10-CM | POA: Diagnosis not present

## 2016-02-06 DIAGNOSIS — N2581 Secondary hyperparathyroidism of renal origin: Secondary | ICD-10-CM | POA: Diagnosis not present

## 2016-02-06 DIAGNOSIS — E1129 Type 2 diabetes mellitus with other diabetic kidney complication: Secondary | ICD-10-CM | POA: Diagnosis not present

## 2016-02-06 DIAGNOSIS — E1122 Type 2 diabetes mellitus with diabetic chronic kidney disease: Secondary | ICD-10-CM | POA: Diagnosis not present

## 2016-02-08 DIAGNOSIS — N2581 Secondary hyperparathyroidism of renal origin: Secondary | ICD-10-CM | POA: Diagnosis not present

## 2016-02-08 DIAGNOSIS — D509 Iron deficiency anemia, unspecified: Secondary | ICD-10-CM | POA: Diagnosis not present

## 2016-02-08 DIAGNOSIS — E1122 Type 2 diabetes mellitus with diabetic chronic kidney disease: Secondary | ICD-10-CM | POA: Diagnosis not present

## 2016-02-08 DIAGNOSIS — N186 End stage renal disease: Secondary | ICD-10-CM | POA: Diagnosis not present

## 2016-02-08 DIAGNOSIS — Z23 Encounter for immunization: Secondary | ICD-10-CM | POA: Diagnosis not present

## 2016-02-08 DIAGNOSIS — D631 Anemia in chronic kidney disease: Secondary | ICD-10-CM | POA: Diagnosis not present

## 2016-02-09 DIAGNOSIS — Z992 Dependence on renal dialysis: Secondary | ICD-10-CM | POA: Diagnosis not present

## 2016-02-09 DIAGNOSIS — N186 End stage renal disease: Secondary | ICD-10-CM | POA: Diagnosis not present

## 2016-02-09 DIAGNOSIS — E1122 Type 2 diabetes mellitus with diabetic chronic kidney disease: Secondary | ICD-10-CM | POA: Diagnosis not present

## 2016-02-11 DIAGNOSIS — D631 Anemia in chronic kidney disease: Secondary | ICD-10-CM | POA: Diagnosis not present

## 2016-02-11 DIAGNOSIS — N186 End stage renal disease: Secondary | ICD-10-CM | POA: Diagnosis not present

## 2016-02-11 DIAGNOSIS — E1122 Type 2 diabetes mellitus with diabetic chronic kidney disease: Secondary | ICD-10-CM | POA: Diagnosis not present

## 2016-02-11 DIAGNOSIS — E1129 Type 2 diabetes mellitus with other diabetic kidney complication: Secondary | ICD-10-CM | POA: Diagnosis not present

## 2016-02-11 DIAGNOSIS — D509 Iron deficiency anemia, unspecified: Secondary | ICD-10-CM | POA: Diagnosis not present

## 2016-02-11 DIAGNOSIS — N2581 Secondary hyperparathyroidism of renal origin: Secondary | ICD-10-CM | POA: Diagnosis not present

## 2016-02-11 DIAGNOSIS — Z23 Encounter for immunization: Secondary | ICD-10-CM | POA: Diagnosis not present

## 2016-02-13 DIAGNOSIS — N186 End stage renal disease: Secondary | ICD-10-CM | POA: Diagnosis not present

## 2016-02-13 DIAGNOSIS — E1122 Type 2 diabetes mellitus with diabetic chronic kidney disease: Secondary | ICD-10-CM | POA: Diagnosis not present

## 2016-02-13 DIAGNOSIS — E1129 Type 2 diabetes mellitus with other diabetic kidney complication: Secondary | ICD-10-CM | POA: Diagnosis not present

## 2016-02-13 DIAGNOSIS — D509 Iron deficiency anemia, unspecified: Secondary | ICD-10-CM | POA: Diagnosis not present

## 2016-02-13 DIAGNOSIS — N2581 Secondary hyperparathyroidism of renal origin: Secondary | ICD-10-CM | POA: Diagnosis not present

## 2016-02-13 DIAGNOSIS — Z23 Encounter for immunization: Secondary | ICD-10-CM | POA: Diagnosis not present

## 2016-02-13 DIAGNOSIS — I82492 Acute embolism and thrombosis of other specified deep vein of left lower extremity: Secondary | ICD-10-CM | POA: Diagnosis not present

## 2016-02-13 LAB — PROTIME-INR: INR: 2.4 — AB (ref <=1.1)

## 2016-02-15 DIAGNOSIS — Z23 Encounter for immunization: Secondary | ICD-10-CM | POA: Diagnosis not present

## 2016-02-15 DIAGNOSIS — N2581 Secondary hyperparathyroidism of renal origin: Secondary | ICD-10-CM | POA: Diagnosis not present

## 2016-02-15 DIAGNOSIS — E1129 Type 2 diabetes mellitus with other diabetic kidney complication: Secondary | ICD-10-CM | POA: Diagnosis not present

## 2016-02-15 DIAGNOSIS — N186 End stage renal disease: Secondary | ICD-10-CM | POA: Diagnosis not present

## 2016-02-15 DIAGNOSIS — E1122 Type 2 diabetes mellitus with diabetic chronic kidney disease: Secondary | ICD-10-CM | POA: Diagnosis not present

## 2016-02-15 DIAGNOSIS — D509 Iron deficiency anemia, unspecified: Secondary | ICD-10-CM | POA: Diagnosis not present

## 2016-02-18 DIAGNOSIS — E1122 Type 2 diabetes mellitus with diabetic chronic kidney disease: Secondary | ICD-10-CM | POA: Diagnosis not present

## 2016-02-18 DIAGNOSIS — Z23 Encounter for immunization: Secondary | ICD-10-CM | POA: Diagnosis not present

## 2016-02-18 DIAGNOSIS — D509 Iron deficiency anemia, unspecified: Secondary | ICD-10-CM | POA: Diagnosis not present

## 2016-02-18 DIAGNOSIS — N186 End stage renal disease: Secondary | ICD-10-CM | POA: Diagnosis not present

## 2016-02-18 DIAGNOSIS — N2581 Secondary hyperparathyroidism of renal origin: Secondary | ICD-10-CM | POA: Diagnosis not present

## 2016-02-18 DIAGNOSIS — E1129 Type 2 diabetes mellitus with other diabetic kidney complication: Secondary | ICD-10-CM | POA: Diagnosis not present

## 2016-02-19 ENCOUNTER — Ambulatory Visit (INDEPENDENT_AMBULATORY_CARE_PROVIDER_SITE_OTHER): Payer: Medicare Other | Admitting: General Practice

## 2016-02-19 NOTE — Progress Notes (Signed)
I have reviewed and agree with the plan. 

## 2016-02-19 NOTE — Progress Notes (Signed)
Pre visit review using our clinic review tool, if applicable. No additional management support is needed unless otherwise documented below in the visit note. 

## 2016-02-20 ENCOUNTER — Emergency Department (HOSPITAL_COMMUNITY): Payer: Medicare Other

## 2016-02-20 ENCOUNTER — Encounter (HOSPITAL_COMMUNITY): Payer: Self-pay | Admitting: *Deleted

## 2016-02-20 ENCOUNTER — Emergency Department (HOSPITAL_COMMUNITY)
Admission: EM | Admit: 2016-02-20 | Discharge: 2016-02-20 | Disposition: A | Discharge status: Home / Self Care | Payer: Medicare Other | Attending: Emergency Medicine | Admitting: Emergency Medicine

## 2016-02-20 DIAGNOSIS — Z8669 Personal history of other diseases of the nervous system and sense organs: Secondary | ICD-10-CM | POA: Diagnosis not present

## 2016-02-20 DIAGNOSIS — Z992 Dependence on renal dialysis: Secondary | ICD-10-CM | POA: Diagnosis not present

## 2016-02-20 DIAGNOSIS — Z79899 Other long term (current) drug therapy: Secondary | ICD-10-CM | POA: Insufficient documentation

## 2016-02-20 DIAGNOSIS — J449 Chronic obstructive pulmonary disease, unspecified: Secondary | ICD-10-CM | POA: Diagnosis not present

## 2016-02-20 DIAGNOSIS — I509 Heart failure, unspecified: Secondary | ICD-10-CM | POA: Insufficient documentation

## 2016-02-20 DIAGNOSIS — E11319 Type 2 diabetes mellitus with unspecified diabetic retinopathy without macular edema: Secondary | ICD-10-CM | POA: Insufficient documentation

## 2016-02-20 DIAGNOSIS — R55 Syncope and collapse: Secondary | ICD-10-CM

## 2016-02-20 DIAGNOSIS — E1122 Type 2 diabetes mellitus with diabetic chronic kidney disease: Secondary | ICD-10-CM | POA: Diagnosis not present

## 2016-02-20 DIAGNOSIS — E1129 Type 2 diabetes mellitus with other diabetic kidney complication: Secondary | ICD-10-CM | POA: Diagnosis not present

## 2016-02-20 DIAGNOSIS — Z23 Encounter for immunization: Secondary | ICD-10-CM | POA: Diagnosis not present

## 2016-02-20 DIAGNOSIS — I82492 Acute embolism and thrombosis of other specified deep vein of left lower extremity: Secondary | ICD-10-CM | POA: Diagnosis not present

## 2016-02-20 DIAGNOSIS — R569 Unspecified convulsions: Secondary | ICD-10-CM | POA: Diagnosis not present

## 2016-02-20 DIAGNOSIS — N186 End stage renal disease: Secondary | ICD-10-CM | POA: Diagnosis not present

## 2016-02-20 DIAGNOSIS — Z86718 Personal history of other venous thrombosis and embolism: Secondary | ICD-10-CM | POA: Insufficient documentation

## 2016-02-20 DIAGNOSIS — Z8619 Personal history of other infectious and parasitic diseases: Secondary | ICD-10-CM | POA: Diagnosis not present

## 2016-02-20 DIAGNOSIS — D509 Iron deficiency anemia, unspecified: Secondary | ICD-10-CM | POA: Diagnosis not present

## 2016-02-20 DIAGNOSIS — Z794 Long term (current) use of insulin: Secondary | ICD-10-CM | POA: Diagnosis not present

## 2016-02-20 DIAGNOSIS — N2581 Secondary hyperparathyroidism of renal origin: Secondary | ICD-10-CM | POA: Diagnosis not present

## 2016-02-20 DIAGNOSIS — I120 Hypertensive chronic kidney disease with stage 5 chronic kidney disease or end stage renal disease: Secondary | ICD-10-CM | POA: Diagnosis not present

## 2016-02-20 DIAGNOSIS — R410 Disorientation, unspecified: Secondary | ICD-10-CM | POA: Diagnosis not present

## 2016-02-20 LAB — CBC WITH DIFFERENTIAL/PLATELET
Basophils Absolute: 0.1 10*3/uL (ref 0.0–0.1)
Basophils Relative: 1 %
Eosinophils Absolute: 0.1 10*3/uL (ref 0.0–0.7)
Eosinophils Relative: 2 %
HCT: 42.6 % (ref 39.0–52.0)
Hemoglobin: 12.8 g/dL — ABNORMAL LOW (ref 13.0–17.0)
Lymphocytes Relative: 11 %
Lymphs Abs: 0.7 10*3/uL (ref 0.7–4.0)
MCH: 24.9 pg — ABNORMAL LOW (ref 26.0–34.0)
MCHC: 30 g/dL (ref 30.0–36.0)
MCV: 82.7 fL (ref 78.0–100.0)
Monocytes Absolute: 0.6 10*3/uL (ref 0.1–1.0)
Monocytes Relative: 9 %
Neutro Abs: 4.8 10*3/uL (ref 1.7–7.7)
Neutrophils Relative %: 77 %
Platelets: 161 10*3/uL (ref 150–400)
RBC: 5.15 MIL/uL (ref 4.22–5.81)
RDW: 21.1 % — ABNORMAL HIGH (ref 11.5–15.5)
WBC: 6.3 10*3/uL (ref 4.0–10.5)

## 2016-02-20 LAB — COMPREHENSIVE METABOLIC PANEL
ALT: 12 U/L — ABNORMAL LOW (ref 17–63)
AST: 16 U/L (ref 15–41)
Albumin: 3.6 g/dL (ref 3.5–5.0)
Alkaline Phosphatase: 81 U/L (ref 38–126)
Anion gap: 12 (ref 5–15)
BUN: 27 mg/dL — ABNORMAL HIGH (ref 6–20)
CO2: 26 mmol/L (ref 22–32)
Calcium: 9.1 mg/dL (ref 8.9–10.3)
Chloride: 100 mmol/L — ABNORMAL LOW (ref 101–111)
Creatinine, Ser: 5.31 mg/dL — ABNORMAL HIGH (ref 0.61–1.24)
GFR calc Af Amer: 13 mL/min — ABNORMAL LOW (ref >60)
GFR calc non Af Amer: 11 mL/min — ABNORMAL LOW (ref >60)
Glucose, Bld: 115 mg/dL — ABNORMAL HIGH (ref 65–99)
Potassium: 4.5 mmol/L (ref 3.5–5.1)
Sodium: 138 mmol/L (ref 135–145)
Total Bilirubin: 1.1 mg/dL (ref 0.3–1.2)
Total Protein: 7.7 g/dL (ref 6.5–8.1)

## 2016-02-20 LAB — I-STAT TROPONIN, ED: Troponin i, poc: 0.04 ng/mL (ref 0.00–0.08)

## 2016-02-20 NOTE — ED Notes (Signed)
Pt ambulated in hallway with walker without assistance. No issues. Denies pain or dizziness.

## 2016-02-20 NOTE — ED Notes (Signed)
Declined W/C at D/C and was escorted to lobby by RN. 

## 2016-02-20 NOTE — ED Provider Notes (Addendum)
CSN: [id number]     Arrival date & time 02/20/16  [id number] History   First MD Initiated Contact with Patient 02/20/16 0957     Chief Complaint  Patient presents with  . Hypotension  . Vascular Access Problem     (Consider location/radiation/quality/duration/timing/severity/associated sxs/prior Treatment) HPI Comments: Patient is a 50 year old male with a history of hypertension, end-stage renal disease on dialysis, diabetes, aortic insufficiency, CHF presenting today from dialysis for a shaking episode. Patient states he felt normal when he got to dialysis today. He had taken his medications and eaten. He states he often will shake when he is on dialysis because his blood pressure will drop but apparently today patient's was not responding as well to the nurse. He states he remembers everything she said that he would not follow commands. He denies unilateral weakness, numbness. He states at the time he had a headache but that is now resolved. After this event he did have nausea and vomiting times one which is also resolved. He denies any chest pain or shortness of breath. No recent medication changes. He has dialyzed on schedule without missing dialysis. He denies any distal edema.  The history is provided by the patient.    Past Medical History  Diagnosis Date  . Hypertension   . Leg pain 02/05/11    with walking  . Weight gain   . Shortness of breath     when lying flat  . Diminished eyesight   . Hearing difficulty   . Anemia   . Nervousness(799.21)   . COPD (chronic obstructive pulmonary disease) ([place])   . CHF (congestive heart failure) ([place])   . Posttraumatic stress disorder   . Allergic rhinitis   . Dyslipidemia   . Morbid obesity ([place])   . Gait disorder   . Bilateral carpal tunnel syndrome   . Gastroparesis   . Gout   . Diabetic retinopathy ([place])   . Renal transplant, status post 04/05/2014  . Incomplete bladder emptying   . Urethral stricture   . Aortic insufficiency      a. Echo 7/16:  Mod LVH, EF 50-55%, mild to mod AI, severe LAE, PASP 57 mmHg (LV ID end diastolic [id number] mm);  b. Echo 2/17:  Mod LVH, EF 50-55%, mod AI, MAC, mild MR, mod LAE, mild to mod TR, PASP 63 mmHg  . Hyperlipidemia   . Osteomyelitis ([place])   . Endocarditis   . Vitamin D deficiency   . Secondary hyperparathyroidism ([place])   . Rotator cuff disorder   . Discitis of lumbar region     resolved  . Obstructive sleep apnea     not using CPAP- lost 100lbs  . Claustrophobia   . Diabetes mellitus     Type II  . Diabetic peripheral neuropathy ([place])   . GERD (gastroesophageal reflux disease)   . Sepsis ([place])     Postive blood cultures -   . Endocarditis 11/28/14  . Complication of anesthesia     "they said something at [employer] it was affected by sleep aqpnea."  . Chronic kidney disease     Kidney Transplant 2009 09/26/15- transplanated kidney failing  . Chronic renal allograft nephropathy   . Chronic kidney disease (CKD), stage IV (severe) ([place])   . DVT (deep venous thrombosis) ([place]) 10/07/2015    LLE   Past Surgical History  Procedure Laterality Date  . Kidney transplant  2009  . Cataract extraction w/ intraocular lens  implant, bilateral Bilateral   . Av  fistula placement Bilateral 2015  . Arteriovenous graft placement Bilateral "several"  . Cystoscopy w/ internal urethrotomy  09/2014  . Bascilic vein transposition Right 09/27/2015  . Bascilic vein transposition Right 09/27/2015    Procedure: BASILIC VEIN TRANSPOSITION right;  Surgeon: Mal Misty, MD;  Location: [facility];  Service: Vascular;  Laterality: Right;   Family History  Problem Relation Age of Onset  . Hypertension Mother   . Diabetes Father    Social History  Substance Use Topics  . Smoking status: Never Smoker   . Smokeless tobacco: Never Used  . Alcohol Use: No    Review of Systems  All other systems reviewed and are negative.     Allergies  Pork-derived products; Ace inhibitors; and  Hydrocodone  Home Medications   Prior to Admission medications   Medication Sig Start Date End Date Taking? Authorizing Provider  calcium acetate (PHOSLO) 667 MG capsule Take 3 capsules (2,001 mg total) by mouth 3 (three) times daily with meals. 10/26/15  Yes Shanker Kristeen Mans, MD  clonazePAM (KLONOPIN) 0.5 MG tablet Take 2 tablets (1 mg total) by mouth at bedtime. 12/02/15  Yes Hoyt Koch, MD  cloNIDine (CATAPRES) 0.3 MG tablet Take 1 tablet (0.3 mg total) by mouth 3 (three) times daily. 11/11/15  Yes Hoyt Koch, MD  Febuxostat (ULORIC) 80 MG TABS Take 80 mg by mouth daily.   Yes Historical Provider, MD  furosemide (LASIX) 80 MG tablet Take 80 mg by mouth daily. Reported on 12/02/2015 11/20/15  Yes Historical Provider, MD  insulin aspart (NOVOLOG FLEXPEN) 100 UNIT/ML FlexPen Inject 6 Units into the skin 3 (three) times daily with meals. 10/26/15  Yes Shanker Kristeen Mans, MD  Insulin Glargine (LANTUS) 100 UNIT/ML Solostar Pen Inject 35 Units into the skin daily at 10 pm. 10/26/15  Yes Shanker Kristeen Mans, MD  labetalol (NORMODYNE) 100 MG tablet TAKE 1 TABLET THREE TIMES DAILY 12/02/15  Yes Hoyt Koch, MD  omeprazole (PRILOSEC) 20 MG capsule Take 20 mg by mouth daily.   Yes Historical Provider, MD  rosuvastatin (CRESTOR) 20 MG tablet Take 1 tablet (20 mg total) by mouth at bedtime. 04/11/15  Yes Hoyt Koch, MD  tacrolimus (PROGRAF) 1 MG capsule Take 1 capsule (1 mg total) by mouth 2 (two) times daily. 10/26/15  Yes Shanker Kristeen Mans, MD  tadalafil (CIALIS) 5 MG tablet TAKE 1 TABLET EVERY DAY 10/23/15  Yes Historical Provider, MD  TOUJEO SOLOSTAR 300 UNIT/ML SOPN INJECT  90 UNITS SUBCUTANEOUSLY TWICE DAILY 12/20/15  Yes Elayne Snare, MD  traMADol (ULTRAM) 50 MG tablet Take 1 tablet (50 mg total) by mouth every 8 (eight) hours as needed. 12/02/15  Yes Hoyt Koch, MD  warfarin (COUMADIN) 5 MG tablet Take 0.5-1 tablets (2.5-5 mg total) by mouth daily at 6 PM. Take 1/2  tablet (2.5mg  on Monday and Friday, whole tablet (5mg ) other days. Patient taking differently: Take 5 mg by mouth daily at 6 PM.  12/02/15  Yes Hoyt Koch, MD  BAYER MICROLET LANCETS lancets Use as instructed to check blood sugar 4 times per day dx code E11.9 Patient not taking: Reported on 02/20/2016 11/27/15   Elayne Snare, MD  BD PEN NEEDLE NANO U/F 32G X 4 MM MISC  11/20/15   Historical Provider, MD  glucose blood (BAYER CONTOUR NEXT TEST) test strip Use as instructed to check blood sugar 4 times per day dx code E11.9 Patient not taking: Reported on 02/20/2016 11/27/15   Elayne Snare,  MD  Insulin Pen Needle 32G X 8 MM MISC Use as directed Patient not taking: Reported on 02/20/2016 10/26/15   Jonetta Osgood, MD  insulin regular human CONCENTRATED (HUMULIN R) 500 UNIT/ML kwikpen Inject 15 units at breakfast, 25 units at lunch and 20 units before supper Patient not taking: Reported on 02/20/2016 11/27/15   Elayne Snare, MD   BP 160/48 mmHg  Pulse 92  Temp(Src) 98.1 F (36.7 C) (Oral)  Resp 20  Ht 6' (1.829 m)  Wt 246 lb 14.6 oz (112 kg)  BMI 33.48 kg/m2  SpO2 100% Physical Exam  Constitutional: He is oriented to person, place, and time. He appears well-developed and well-nourished. No distress.  HENT:  Head: Normocephalic and atraumatic.  Mouth/Throat: Oropharynx is clear and moist.  Eyes: Conjunctivae and EOM are normal. Pupils are equal, round, and reactive to light.  Neck: Normal range of motion. Neck supple.  Cardiovascular: Normal rate, regular rhythm and intact distal pulses.   No murmur heard. Pulmonary/Chest: Effort normal and breath sounds normal. No respiratory distress. He has no wheezes. He has no rales.  Abdominal: Soft. He exhibits no distension. There is no tenderness. There is no rebound and no guarding.  Musculoskeletal: Normal range of motion. He exhibits no edema or tenderness.  Graft present in bilateral upper extremities. Right upper extremity is accessed. Thrills  present bilaterally  Neurological: He is alert and oriented to person, place, and time.  Skin: Skin is warm and dry. No rash noted. No erythema.  Psychiatric: He has a normal mood and affect. His behavior is normal.  Nursing note and vitals reviewed.   ED Course  Procedures (including critical care time) Labs Review Labs Reviewed  CBC WITH DIFFERENTIAL/PLATELET - Abnormal; Notable for the following:    Hemoglobin 12.8 (*)    MCH 24.9 (*)    RDW 21.1 (*)    All other components within normal limits  COMPREHENSIVE METABOLIC PANEL - Abnormal; Notable for the following:    Chloride 100 (*)    Glucose, Bld 115 (*)    BUN 27 (*)    Creatinine, Ser 5.31 (*)    ALT 12 (*)    GFR calc non Af Amer 11 (*)    GFR calc Af Amer 13 (*)    All other components within normal limits  I-STAT TROPOININ, ED  Randolm Idol, ED    Imaging Review Dg Chest 2 View  02/20/2016  CLINICAL DATA:  Syncope, confusion, headache EXAM: CHEST  2 VIEW COMPARISON:  11/15/2015 FINDINGS: Cardiomegaly again noted. No acute infiltrate or pleural effusion. No pulmonary edema. Bony thorax is unremarkable. IMPRESSION: No active cardiopulmonary disease.  Cardiomegaly again noted. Electronically Signed   By: Lahoma Crocker M.D.   On: 02/20/2016 11:30   Ct Head Wo Contrast  02/20/2016  CLINICAL DATA:  Near syncope, confusion EXAM: CT HEAD WITHOUT CONTRAST TECHNIQUE: Contiguous axial images were obtained from the base of the skull through the vertex without intravenous contrast. COMPARISON:  10/13/2015 FINDINGS: Brain: No intracranial hemorrhage, mass effect or midline shift. Minimal atrophy. Ventricular size is stable from prior exam. No acute cortical infarction. No intra or extra-axial fluid collection. No mass lesion is noted on this unenhanced scan. Vascular: Mild atherosclerotic calcifications of carotid siphon. Skull: No skull fracture. Sinuses/Orbits: Paranasal sinuses and mastoid air cells are unremarkable. No intraorbital  hematoma. Other: None IMPRESSION: No acute intracranial abnormality. No definite acute cortical infarction. No significant change. Electronically Signed   By: Orlean Bradford.D.  On: 02/20/2016 11:14   I have personally reviewed and evaluated these images and lab results as part of my medical decision-making.   EKG Interpretation   Date/Time:  Thursday Feb 20 2016 10:09:15 EDT Ventricular Rate:  90 PR Interval:  208 QRS Duration: 161 QT Interval:  419 QTC Calculation: 513 R Axis:   -88 Text Interpretation:  Sinus or ectopic atrial rhythm Borderline prolonged  PR interval Right bundle branch block LVH by voltage Inferior infarct, old  No significant change since last tracing Confirmed by Maryan Rued  MD,  Loree Fee ([id number]) on 02/20/2016 10:34:33 AM      MDM   Final diagnoses:  Near syncope    Patient is a 50 year old male with a history of end-stage renal disease on dialysis presenting today from dialysis for a shaking episode. Patient states he felt generally weak and could not follow the nurses instructions because he was so fatigued and weak. After that episode he vomited and currently feels normal. Patient has been able to eat and drink here and currently denies any symptoms. He has no history of seizure and did not have a postictal period or anything to suggest seizure today. However patient has had intermittent hypotension while being on dialysis and concern that he may have had a near syncopal event today resulting in his symptoms. His blood sugar has been stable and blood pressure here has been normal to high. EKG without acute changes. Troponin is normal, CBC without acute findings and stable hemoglobin of 12 and CMP without acute findings. Chest x-ray and head CT are within normal limits. No evidence of stroke or CHF.  Patient was able to ambulate here without difficulty and feel it is reasonable to let him go home. He was given strict return precautions and cautioned that he needs a  follow-up with his doctor symptoms recur    Blanchie Dessert, MD 02/20/16 [place], MD 02/20/16 1232

## 2016-02-20 NOTE — ED Notes (Addendum)
EMS and Pt reports PT has an episode of shaking while on dialysis . Pt Denies any Hx of seizures but does report a HX of hypotension. Pt does not take seizure Meds. Per EMS CBG 221 and EKG bundle branch block.

## 2016-02-20 NOTE — Discharge Instructions (Signed)

## 2016-02-21 LAB — PROTIME-INR: INR: 2 — AB (ref <=1.1)

## 2016-02-22 DIAGNOSIS — E1122 Type 2 diabetes mellitus with diabetic chronic kidney disease: Secondary | ICD-10-CM | POA: Diagnosis not present

## 2016-02-22 DIAGNOSIS — N2581 Secondary hyperparathyroidism of renal origin: Secondary | ICD-10-CM | POA: Diagnosis not present

## 2016-02-22 DIAGNOSIS — E1129 Type 2 diabetes mellitus with other diabetic kidney complication: Secondary | ICD-10-CM | POA: Diagnosis not present

## 2016-02-22 DIAGNOSIS — D509 Iron deficiency anemia, unspecified: Secondary | ICD-10-CM | POA: Diagnosis not present

## 2016-02-22 DIAGNOSIS — N186 End stage renal disease: Secondary | ICD-10-CM | POA: Diagnosis not present

## 2016-02-22 DIAGNOSIS — Z23 Encounter for immunization: Secondary | ICD-10-CM | POA: Diagnosis not present

## 2016-02-25 DIAGNOSIS — Z23 Encounter for immunization: Secondary | ICD-10-CM | POA: Diagnosis not present

## 2016-02-25 DIAGNOSIS — E1122 Type 2 diabetes mellitus with diabetic chronic kidney disease: Secondary | ICD-10-CM | POA: Diagnosis not present

## 2016-02-25 DIAGNOSIS — N2581 Secondary hyperparathyroidism of renal origin: Secondary | ICD-10-CM | POA: Diagnosis not present

## 2016-02-25 DIAGNOSIS — N186 End stage renal disease: Secondary | ICD-10-CM | POA: Diagnosis not present

## 2016-02-25 DIAGNOSIS — D509 Iron deficiency anemia, unspecified: Secondary | ICD-10-CM | POA: Diagnosis not present

## 2016-02-25 DIAGNOSIS — E1129 Type 2 diabetes mellitus with other diabetic kidney complication: Secondary | ICD-10-CM | POA: Diagnosis not present

## 2016-02-26 ENCOUNTER — Ambulatory Visit (INDEPENDENT_AMBULATORY_CARE_PROVIDER_SITE_OTHER): Payer: Medicare Other | Admitting: General Practice

## 2016-02-26 NOTE — Progress Notes (Signed)
Pre visit review using our clinic review tool, if applicable. No additional management support is needed unless otherwise documented below in the visit note. 

## 2016-02-26 NOTE — Progress Notes (Signed)
I have reviewed and agree with the plan. 

## 2016-02-27 DIAGNOSIS — Z23 Encounter for immunization: Secondary | ICD-10-CM | POA: Diagnosis not present

## 2016-02-27 DIAGNOSIS — E1129 Type 2 diabetes mellitus with other diabetic kidney complication: Secondary | ICD-10-CM | POA: Diagnosis not present

## 2016-02-27 DIAGNOSIS — I82492 Acute embolism and thrombosis of other specified deep vein of left lower extremity: Secondary | ICD-10-CM | POA: Diagnosis not present

## 2016-02-27 DIAGNOSIS — N2581 Secondary hyperparathyroidism of renal origin: Secondary | ICD-10-CM | POA: Diagnosis not present

## 2016-02-27 DIAGNOSIS — N186 End stage renal disease: Secondary | ICD-10-CM | POA: Diagnosis not present

## 2016-02-27 DIAGNOSIS — E1122 Type 2 diabetes mellitus with diabetic chronic kidney disease: Secondary | ICD-10-CM | POA: Diagnosis not present

## 2016-02-27 DIAGNOSIS — D509 Iron deficiency anemia, unspecified: Secondary | ICD-10-CM | POA: Diagnosis not present

## 2016-02-29 DIAGNOSIS — N2581 Secondary hyperparathyroidism of renal origin: Secondary | ICD-10-CM | POA: Diagnosis not present

## 2016-02-29 DIAGNOSIS — N186 End stage renal disease: Secondary | ICD-10-CM | POA: Diagnosis not present

## 2016-02-29 DIAGNOSIS — Z23 Encounter for immunization: Secondary | ICD-10-CM | POA: Diagnosis not present

## 2016-02-29 DIAGNOSIS — E1129 Type 2 diabetes mellitus with other diabetic kidney complication: Secondary | ICD-10-CM | POA: Diagnosis not present

## 2016-02-29 DIAGNOSIS — E1122 Type 2 diabetes mellitus with diabetic chronic kidney disease: Secondary | ICD-10-CM | POA: Diagnosis not present

## 2016-02-29 DIAGNOSIS — D509 Iron deficiency anemia, unspecified: Secondary | ICD-10-CM | POA: Diagnosis not present

## 2016-03-03 DIAGNOSIS — Z23 Encounter for immunization: Secondary | ICD-10-CM | POA: Diagnosis not present

## 2016-03-03 DIAGNOSIS — E1122 Type 2 diabetes mellitus with diabetic chronic kidney disease: Secondary | ICD-10-CM | POA: Diagnosis not present

## 2016-03-03 DIAGNOSIS — E1129 Type 2 diabetes mellitus with other diabetic kidney complication: Secondary | ICD-10-CM | POA: Diagnosis not present

## 2016-03-03 DIAGNOSIS — N186 End stage renal disease: Secondary | ICD-10-CM | POA: Diagnosis not present

## 2016-03-03 DIAGNOSIS — N2581 Secondary hyperparathyroidism of renal origin: Secondary | ICD-10-CM | POA: Diagnosis not present

## 2016-03-03 DIAGNOSIS — D509 Iron deficiency anemia, unspecified: Secondary | ICD-10-CM | POA: Diagnosis not present

## 2016-03-04 ENCOUNTER — Ambulatory Visit (INDEPENDENT_AMBULATORY_CARE_PROVIDER_SITE_OTHER): Payer: Medicare Other | Admitting: General Practice

## 2016-03-04 LAB — POCT INR: INR: 1.74

## 2016-03-04 NOTE — Progress Notes (Signed)
I have reviewed and agree with the plan. 

## 2016-03-04 NOTE — Progress Notes (Signed)
Pre visit review using our clinic review tool, if applicable. No additional management support is needed unless otherwise documented below in the visit note. 

## 2016-03-05 DIAGNOSIS — N2581 Secondary hyperparathyroidism of renal origin: Secondary | ICD-10-CM | POA: Diagnosis not present

## 2016-03-05 DIAGNOSIS — E1122 Type 2 diabetes mellitus with diabetic chronic kidney disease: Secondary | ICD-10-CM | POA: Diagnosis not present

## 2016-03-05 DIAGNOSIS — D509 Iron deficiency anemia, unspecified: Secondary | ICD-10-CM | POA: Diagnosis not present

## 2016-03-05 DIAGNOSIS — M109 Gout, unspecified: Secondary | ICD-10-CM | POA: Diagnosis not present

## 2016-03-05 DIAGNOSIS — N186 End stage renal disease: Secondary | ICD-10-CM | POA: Diagnosis not present

## 2016-03-05 DIAGNOSIS — E1129 Type 2 diabetes mellitus with other diabetic kidney complication: Secondary | ICD-10-CM | POA: Diagnosis not present

## 2016-03-05 DIAGNOSIS — Z23 Encounter for immunization: Secondary | ICD-10-CM | POA: Diagnosis not present

## 2016-03-06 LAB — PROTIME-INR: INR: 1.3 — AB (ref <=1.1)

## 2016-03-07 DIAGNOSIS — E1122 Type 2 diabetes mellitus with diabetic chronic kidney disease: Secondary | ICD-10-CM | POA: Diagnosis not present

## 2016-03-07 DIAGNOSIS — N186 End stage renal disease: Secondary | ICD-10-CM | POA: Diagnosis not present

## 2016-03-07 DIAGNOSIS — Z23 Encounter for immunization: Secondary | ICD-10-CM | POA: Diagnosis not present

## 2016-03-07 DIAGNOSIS — E1129 Type 2 diabetes mellitus with other diabetic kidney complication: Secondary | ICD-10-CM | POA: Diagnosis not present

## 2016-03-07 DIAGNOSIS — N2581 Secondary hyperparathyroidism of renal origin: Secondary | ICD-10-CM | POA: Diagnosis not present

## 2016-03-07 DIAGNOSIS — D509 Iron deficiency anemia, unspecified: Secondary | ICD-10-CM | POA: Diagnosis not present

## 2016-03-10 DIAGNOSIS — E1122 Type 2 diabetes mellitus with diabetic chronic kidney disease: Secondary | ICD-10-CM | POA: Diagnosis not present

## 2016-03-10 DIAGNOSIS — D509 Iron deficiency anemia, unspecified: Secondary | ICD-10-CM | POA: Diagnosis not present

## 2016-03-10 DIAGNOSIS — N186 End stage renal disease: Secondary | ICD-10-CM | POA: Diagnosis not present

## 2016-03-10 DIAGNOSIS — E1129 Type 2 diabetes mellitus with other diabetic kidney complication: Secondary | ICD-10-CM | POA: Diagnosis not present

## 2016-03-10 DIAGNOSIS — Z23 Encounter for immunization: Secondary | ICD-10-CM | POA: Diagnosis not present

## 2016-03-10 DIAGNOSIS — N2581 Secondary hyperparathyroidism of renal origin: Secondary | ICD-10-CM | POA: Diagnosis not present

## 2016-03-11 ENCOUNTER — Ambulatory Visit: Payer: Self-pay | Admitting: General Practice

## 2016-03-11 DIAGNOSIS — N186 End stage renal disease: Secondary | ICD-10-CM | POA: Diagnosis not present

## 2016-03-11 DIAGNOSIS — Z992 Dependence on renal dialysis: Secondary | ICD-10-CM | POA: Diagnosis not present

## 2016-03-11 DIAGNOSIS — E1122 Type 2 diabetes mellitus with diabetic chronic kidney disease: Secondary | ICD-10-CM | POA: Diagnosis not present

## 2016-03-11 NOTE — Progress Notes (Signed)
I have reviewed and agree with the plan. 

## 2016-03-11 NOTE — Progress Notes (Signed)
Pre visit review using our clinic review tool, if applicable. No additional management support is needed unless otherwise documented below in the visit note. 

## 2016-03-12 DIAGNOSIS — E1129 Type 2 diabetes mellitus with other diabetic kidney complication: Secondary | ICD-10-CM | POA: Diagnosis not present

## 2016-03-12 DIAGNOSIS — D509 Iron deficiency anemia, unspecified: Secondary | ICD-10-CM | POA: Diagnosis not present

## 2016-03-12 DIAGNOSIS — N2581 Secondary hyperparathyroidism of renal origin: Secondary | ICD-10-CM | POA: Diagnosis not present

## 2016-03-12 DIAGNOSIS — N186 End stage renal disease: Secondary | ICD-10-CM | POA: Diagnosis not present

## 2016-03-12 DIAGNOSIS — I82492 Acute embolism and thrombosis of other specified deep vein of left lower extremity: Secondary | ICD-10-CM | POA: Diagnosis not present

## 2016-03-12 DIAGNOSIS — Z992 Dependence on renal dialysis: Secondary | ICD-10-CM | POA: Diagnosis not present

## 2016-03-12 DIAGNOSIS — D631 Anemia in chronic kidney disease: Secondary | ICD-10-CM | POA: Diagnosis not present

## 2016-03-12 DIAGNOSIS — E1122 Type 2 diabetes mellitus with diabetic chronic kidney disease: Secondary | ICD-10-CM | POA: Diagnosis not present

## 2016-03-14 DIAGNOSIS — Z992 Dependence on renal dialysis: Secondary | ICD-10-CM | POA: Diagnosis not present

## 2016-03-14 DIAGNOSIS — N186 End stage renal disease: Secondary | ICD-10-CM | POA: Diagnosis not present

## 2016-03-14 DIAGNOSIS — N2581 Secondary hyperparathyroidism of renal origin: Secondary | ICD-10-CM | POA: Diagnosis not present

## 2016-03-14 DIAGNOSIS — D509 Iron deficiency anemia, unspecified: Secondary | ICD-10-CM | POA: Diagnosis not present

## 2016-03-14 DIAGNOSIS — E1122 Type 2 diabetes mellitus with diabetic chronic kidney disease: Secondary | ICD-10-CM | POA: Diagnosis not present

## 2016-03-14 DIAGNOSIS — D631 Anemia in chronic kidney disease: Secondary | ICD-10-CM | POA: Diagnosis not present

## 2016-03-17 DIAGNOSIS — Z992 Dependence on renal dialysis: Secondary | ICD-10-CM | POA: Diagnosis not present

## 2016-03-17 DIAGNOSIS — N2581 Secondary hyperparathyroidism of renal origin: Secondary | ICD-10-CM | POA: Diagnosis not present

## 2016-03-17 DIAGNOSIS — E1122 Type 2 diabetes mellitus with diabetic chronic kidney disease: Secondary | ICD-10-CM | POA: Diagnosis not present

## 2016-03-17 DIAGNOSIS — D631 Anemia in chronic kidney disease: Secondary | ICD-10-CM | POA: Diagnosis not present

## 2016-03-17 DIAGNOSIS — D509 Iron deficiency anemia, unspecified: Secondary | ICD-10-CM | POA: Diagnosis not present

## 2016-03-17 DIAGNOSIS — N186 End stage renal disease: Secondary | ICD-10-CM | POA: Diagnosis not present

## 2016-03-19 DIAGNOSIS — Z992 Dependence on renal dialysis: Secondary | ICD-10-CM | POA: Diagnosis not present

## 2016-03-19 DIAGNOSIS — I82492 Acute embolism and thrombosis of other specified deep vein of left lower extremity: Secondary | ICD-10-CM | POA: Diagnosis not present

## 2016-03-19 DIAGNOSIS — N186 End stage renal disease: Secondary | ICD-10-CM | POA: Diagnosis not present

## 2016-03-19 DIAGNOSIS — D509 Iron deficiency anemia, unspecified: Secondary | ICD-10-CM | POA: Diagnosis not present

## 2016-03-19 DIAGNOSIS — D631 Anemia in chronic kidney disease: Secondary | ICD-10-CM | POA: Diagnosis not present

## 2016-03-19 DIAGNOSIS — N2581 Secondary hyperparathyroidism of renal origin: Secondary | ICD-10-CM | POA: Diagnosis not present

## 2016-03-19 DIAGNOSIS — E1122 Type 2 diabetes mellitus with diabetic chronic kidney disease: Secondary | ICD-10-CM | POA: Diagnosis not present

## 2016-03-19 LAB — PROTIME-INR: INR: 1.3 — AB (ref <=1.1)

## 2016-03-20 ENCOUNTER — Other Ambulatory Visit: Payer: Self-pay | Admitting: Internal Medicine

## 2016-03-20 ENCOUNTER — Other Ambulatory Visit: Payer: Self-pay | Admitting: Endocrinology

## 2016-03-20 MED ORDER — OMEPRAZOLE 20 MG PO CPDR: 20.0000 mg | DELAYED_RELEASE_CAPSULE | Freq: Every day | ORAL | Status: DC

## 2016-03-20 MED ORDER — FEBUXOSTAT 80 MG PO TABS: 80.0000 mg | ORAL_TABLET | Freq: Every day | ORAL | Status: DC

## 2016-03-20 MED ORDER — ROSUVASTATIN CALCIUM 20 MG PO TABS: 20.0000 mg | ORAL_TABLET | Freq: Every day | ORAL | Status: DC

## 2016-03-20 NOTE — Telephone Encounter (Signed)
Sent to pharmacy 

## 2016-03-21 DIAGNOSIS — N2581 Secondary hyperparathyroidism of renal origin: Secondary | ICD-10-CM | POA: Diagnosis not present

## 2016-03-21 DIAGNOSIS — Z992 Dependence on renal dialysis: Secondary | ICD-10-CM | POA: Diagnosis not present

## 2016-03-21 DIAGNOSIS — E1122 Type 2 diabetes mellitus with diabetic chronic kidney disease: Secondary | ICD-10-CM | POA: Diagnosis not present

## 2016-03-21 DIAGNOSIS — D631 Anemia in chronic kidney disease: Secondary | ICD-10-CM | POA: Diagnosis not present

## 2016-03-21 DIAGNOSIS — D509 Iron deficiency anemia, unspecified: Secondary | ICD-10-CM | POA: Diagnosis not present

## 2016-03-21 DIAGNOSIS — N186 End stage renal disease: Secondary | ICD-10-CM | POA: Diagnosis not present

## 2016-03-24 DIAGNOSIS — N2581 Secondary hyperparathyroidism of renal origin: Secondary | ICD-10-CM | POA: Diagnosis not present

## 2016-03-24 DIAGNOSIS — N186 End stage renal disease: Secondary | ICD-10-CM | POA: Diagnosis not present

## 2016-03-24 DIAGNOSIS — D631 Anemia in chronic kidney disease: Secondary | ICD-10-CM | POA: Diagnosis not present

## 2016-03-24 DIAGNOSIS — E1122 Type 2 diabetes mellitus with diabetic chronic kidney disease: Secondary | ICD-10-CM | POA: Diagnosis not present

## 2016-03-24 DIAGNOSIS — Z992 Dependence on renal dialysis: Secondary | ICD-10-CM | POA: Diagnosis not present

## 2016-03-24 DIAGNOSIS — D509 Iron deficiency anemia, unspecified: Secondary | ICD-10-CM | POA: Diagnosis not present

## 2016-03-25 ENCOUNTER — Ambulatory Visit (INDEPENDENT_AMBULATORY_CARE_PROVIDER_SITE_OTHER): Payer: Medicare Other | Admitting: General Practice

## 2016-03-25 ENCOUNTER — Other Ambulatory Visit: Payer: Self-pay | Admitting: General Practice

## 2016-03-25 MED ORDER — WARFARIN SODIUM 5 MG PO TABS: ORAL_TABLET | ORAL | Status: DC

## 2016-03-25 NOTE — Progress Notes (Signed)
I have reviewed and agree with the plan. 

## 2016-03-25 NOTE — Progress Notes (Signed)
Pre visit review using our clinic review tool, if applicable. No additional management support is needed unless otherwise documented below in the visit note. 

## 2016-03-26 DIAGNOSIS — D509 Iron deficiency anemia, unspecified: Secondary | ICD-10-CM | POA: Diagnosis not present

## 2016-03-26 DIAGNOSIS — N186 End stage renal disease: Secondary | ICD-10-CM | POA: Diagnosis not present

## 2016-03-26 DIAGNOSIS — E1122 Type 2 diabetes mellitus with diabetic chronic kidney disease: Secondary | ICD-10-CM | POA: Diagnosis not present

## 2016-03-26 DIAGNOSIS — I82492 Acute embolism and thrombosis of other specified deep vein of left lower extremity: Secondary | ICD-10-CM | POA: Diagnosis not present

## 2016-03-26 DIAGNOSIS — N2581 Secondary hyperparathyroidism of renal origin: Secondary | ICD-10-CM | POA: Diagnosis not present

## 2016-03-26 DIAGNOSIS — D631 Anemia in chronic kidney disease: Secondary | ICD-10-CM | POA: Diagnosis not present

## 2016-03-26 DIAGNOSIS — Z992 Dependence on renal dialysis: Secondary | ICD-10-CM | POA: Diagnosis not present

## 2016-03-28 DIAGNOSIS — D631 Anemia in chronic kidney disease: Secondary | ICD-10-CM | POA: Diagnosis not present

## 2016-03-28 DIAGNOSIS — N186 End stage renal disease: Secondary | ICD-10-CM | POA: Diagnosis not present

## 2016-03-28 DIAGNOSIS — E1122 Type 2 diabetes mellitus with diabetic chronic kidney disease: Secondary | ICD-10-CM | POA: Diagnosis not present

## 2016-03-28 DIAGNOSIS — N2581 Secondary hyperparathyroidism of renal origin: Secondary | ICD-10-CM | POA: Diagnosis not present

## 2016-03-28 DIAGNOSIS — Z992 Dependence on renal dialysis: Secondary | ICD-10-CM | POA: Diagnosis not present

## 2016-03-28 DIAGNOSIS — D509 Iron deficiency anemia, unspecified: Secondary | ICD-10-CM | POA: Diagnosis not present

## 2016-03-31 DIAGNOSIS — N2581 Secondary hyperparathyroidism of renal origin: Secondary | ICD-10-CM | POA: Diagnosis not present

## 2016-03-31 DIAGNOSIS — E1122 Type 2 diabetes mellitus with diabetic chronic kidney disease: Secondary | ICD-10-CM | POA: Diagnosis not present

## 2016-03-31 DIAGNOSIS — Z992 Dependence on renal dialysis: Secondary | ICD-10-CM | POA: Diagnosis not present

## 2016-03-31 DIAGNOSIS — D509 Iron deficiency anemia, unspecified: Secondary | ICD-10-CM | POA: Diagnosis not present

## 2016-03-31 DIAGNOSIS — N186 End stage renal disease: Secondary | ICD-10-CM | POA: Diagnosis not present

## 2016-03-31 DIAGNOSIS — D631 Anemia in chronic kidney disease: Secondary | ICD-10-CM | POA: Diagnosis not present

## 2016-04-02 DIAGNOSIS — E1122 Type 2 diabetes mellitus with diabetic chronic kidney disease: Secondary | ICD-10-CM | POA: Diagnosis not present

## 2016-04-02 DIAGNOSIS — N2581 Secondary hyperparathyroidism of renal origin: Secondary | ICD-10-CM | POA: Diagnosis not present

## 2016-04-02 DIAGNOSIS — D631 Anemia in chronic kidney disease: Secondary | ICD-10-CM | POA: Diagnosis not present

## 2016-04-02 DIAGNOSIS — Z992 Dependence on renal dialysis: Secondary | ICD-10-CM | POA: Diagnosis not present

## 2016-04-02 DIAGNOSIS — D509 Iron deficiency anemia, unspecified: Secondary | ICD-10-CM | POA: Diagnosis not present

## 2016-04-02 DIAGNOSIS — I82492 Acute embolism and thrombosis of other specified deep vein of left lower extremity: Secondary | ICD-10-CM | POA: Diagnosis not present

## 2016-04-02 DIAGNOSIS — N186 End stage renal disease: Secondary | ICD-10-CM | POA: Diagnosis not present

## 2016-04-02 LAB — PROTIME-INR: INR: 1.8 — AB (ref <=1.1)

## 2016-04-04 DIAGNOSIS — N186 End stage renal disease: Secondary | ICD-10-CM | POA: Diagnosis not present

## 2016-04-04 DIAGNOSIS — Z992 Dependence on renal dialysis: Secondary | ICD-10-CM | POA: Diagnosis not present

## 2016-04-04 DIAGNOSIS — D631 Anemia in chronic kidney disease: Secondary | ICD-10-CM | POA: Diagnosis not present

## 2016-04-04 DIAGNOSIS — N2581 Secondary hyperparathyroidism of renal origin: Secondary | ICD-10-CM | POA: Diagnosis not present

## 2016-04-04 DIAGNOSIS — E1122 Type 2 diabetes mellitus with diabetic chronic kidney disease: Secondary | ICD-10-CM | POA: Diagnosis not present

## 2016-04-04 DIAGNOSIS — D509 Iron deficiency anemia, unspecified: Secondary | ICD-10-CM | POA: Diagnosis not present

## 2016-04-07 ENCOUNTER — Other Ambulatory Visit: Payer: Self-pay | Admitting: *Deleted

## 2016-04-07 DIAGNOSIS — N2581 Secondary hyperparathyroidism of renal origin: Secondary | ICD-10-CM | POA: Diagnosis not present

## 2016-04-07 DIAGNOSIS — D631 Anemia in chronic kidney disease: Secondary | ICD-10-CM | POA: Diagnosis not present

## 2016-04-07 DIAGNOSIS — Z992 Dependence on renal dialysis: Secondary | ICD-10-CM | POA: Diagnosis not present

## 2016-04-07 DIAGNOSIS — D509 Iron deficiency anemia, unspecified: Secondary | ICD-10-CM | POA: Diagnosis not present

## 2016-04-07 DIAGNOSIS — E1122 Type 2 diabetes mellitus with diabetic chronic kidney disease: Secondary | ICD-10-CM | POA: Diagnosis not present

## 2016-04-07 DIAGNOSIS — N186 End stage renal disease: Secondary | ICD-10-CM | POA: Diagnosis not present

## 2016-04-07 MED ORDER — ROSUVASTATIN CALCIUM 20 MG PO TABS: 20.0000 mg | ORAL_TABLET | Freq: Every day | ORAL | Status: DC

## 2016-04-07 MED ORDER — OMEPRAZOLE 20 MG PO CPDR: 20.0000 mg | DELAYED_RELEASE_CAPSULE | Freq: Every day | ORAL | Status: DC

## 2016-04-07 MED ORDER — FEBUXOSTAT 80 MG PO TABS: 80.0000 mg | ORAL_TABLET | Freq: Every day | ORAL | Status: DC

## 2016-04-08 ENCOUNTER — Ambulatory Visit (INDEPENDENT_AMBULATORY_CARE_PROVIDER_SITE_OTHER): Payer: Medicare Other | Admitting: General Practice

## 2016-04-08 NOTE — Progress Notes (Signed)
Pre visit review using our clinic review tool, if applicable. No additional management support is needed unless otherwise documented below in the visit note. 

## 2016-04-08 NOTE — Progress Notes (Signed)
I have reviewed and agree with the plan. 

## 2016-04-09 DIAGNOSIS — N186 End stage renal disease: Secondary | ICD-10-CM | POA: Diagnosis not present

## 2016-04-09 DIAGNOSIS — Z992 Dependence on renal dialysis: Secondary | ICD-10-CM | POA: Diagnosis not present

## 2016-04-09 DIAGNOSIS — D631 Anemia in chronic kidney disease: Secondary | ICD-10-CM | POA: Diagnosis not present

## 2016-04-09 DIAGNOSIS — N2581 Secondary hyperparathyroidism of renal origin: Secondary | ICD-10-CM | POA: Diagnosis not present

## 2016-04-09 DIAGNOSIS — M109 Gout, unspecified: Secondary | ICD-10-CM | POA: Diagnosis not present

## 2016-04-09 DIAGNOSIS — E1122 Type 2 diabetes mellitus with diabetic chronic kidney disease: Secondary | ICD-10-CM | POA: Diagnosis not present

## 2016-04-09 DIAGNOSIS — D509 Iron deficiency anemia, unspecified: Secondary | ICD-10-CM | POA: Diagnosis not present

## 2016-04-09 DIAGNOSIS — I82492 Acute embolism and thrombosis of other specified deep vein of left lower extremity: Secondary | ICD-10-CM | POA: Diagnosis not present

## 2016-04-09 LAB — PROTIME-INR: INR: 1.9 — AB (ref <=1.1)

## 2016-04-10 DIAGNOSIS — Z992 Dependence on renal dialysis: Secondary | ICD-10-CM | POA: Diagnosis not present

## 2016-04-10 DIAGNOSIS — N186 End stage renal disease: Secondary | ICD-10-CM | POA: Diagnosis not present

## 2016-04-10 DIAGNOSIS — E1122 Type 2 diabetes mellitus with diabetic chronic kidney disease: Secondary | ICD-10-CM | POA: Diagnosis not present

## 2016-04-11 DIAGNOSIS — N2581 Secondary hyperparathyroidism of renal origin: Secondary | ICD-10-CM | POA: Diagnosis not present

## 2016-04-11 DIAGNOSIS — E1129 Type 2 diabetes mellitus with other diabetic kidney complication: Secondary | ICD-10-CM | POA: Diagnosis not present

## 2016-04-11 DIAGNOSIS — D631 Anemia in chronic kidney disease: Secondary | ICD-10-CM | POA: Diagnosis not present

## 2016-04-11 DIAGNOSIS — E1122 Type 2 diabetes mellitus with diabetic chronic kidney disease: Secondary | ICD-10-CM | POA: Diagnosis not present

## 2016-04-11 DIAGNOSIS — N186 End stage renal disease: Secondary | ICD-10-CM | POA: Diagnosis not present

## 2016-04-11 DIAGNOSIS — D509 Iron deficiency anemia, unspecified: Secondary | ICD-10-CM | POA: Diagnosis not present

## 2016-04-13 ENCOUNTER — Telehealth: Payer: Self-pay | Admitting: Geriatric Medicine

## 2016-04-13 NOTE — Telephone Encounter (Signed)
PT 21.9 INR 1.94  Please advise, thanks.

## 2016-04-13 NOTE — Telephone Encounter (Signed)
Patient aware.

## 2016-04-13 NOTE — Telephone Encounter (Signed)
Please have patient take 2 tablets today and then increase to 5 mg daily.

## 2016-04-14 DIAGNOSIS — E1129 Type 2 diabetes mellitus with other diabetic kidney complication: Secondary | ICD-10-CM | POA: Diagnosis not present

## 2016-04-14 DIAGNOSIS — N186 End stage renal disease: Secondary | ICD-10-CM | POA: Diagnosis not present

## 2016-04-14 DIAGNOSIS — N2581 Secondary hyperparathyroidism of renal origin: Secondary | ICD-10-CM | POA: Diagnosis not present

## 2016-04-14 DIAGNOSIS — E1122 Type 2 diabetes mellitus with diabetic chronic kidney disease: Secondary | ICD-10-CM | POA: Diagnosis not present

## 2016-04-14 DIAGNOSIS — D631 Anemia in chronic kidney disease: Secondary | ICD-10-CM | POA: Diagnosis not present

## 2016-04-14 DIAGNOSIS — D509 Iron deficiency anemia, unspecified: Secondary | ICD-10-CM | POA: Diagnosis not present

## 2016-04-15 ENCOUNTER — Ambulatory Visit (INDEPENDENT_AMBULATORY_CARE_PROVIDER_SITE_OTHER): Payer: Medicare Other | Admitting: General Practice

## 2016-04-15 NOTE — Progress Notes (Signed)
Pre visit review using our clinic review tool, if applicable. No additional management support is needed unless otherwise documented below in the visit note. 

## 2016-04-16 DIAGNOSIS — D509 Iron deficiency anemia, unspecified: Secondary | ICD-10-CM | POA: Diagnosis not present

## 2016-04-16 DIAGNOSIS — D631 Anemia in chronic kidney disease: Secondary | ICD-10-CM | POA: Diagnosis not present

## 2016-04-16 DIAGNOSIS — N2581 Secondary hyperparathyroidism of renal origin: Secondary | ICD-10-CM | POA: Diagnosis not present

## 2016-04-16 DIAGNOSIS — E1122 Type 2 diabetes mellitus with diabetic chronic kidney disease: Secondary | ICD-10-CM | POA: Diagnosis not present

## 2016-04-16 DIAGNOSIS — E1129 Type 2 diabetes mellitus with other diabetic kidney complication: Secondary | ICD-10-CM | POA: Diagnosis not present

## 2016-04-16 DIAGNOSIS — I82492 Acute embolism and thrombosis of other specified deep vein of left lower extremity: Secondary | ICD-10-CM | POA: Diagnosis not present

## 2016-04-16 DIAGNOSIS — N186 End stage renal disease: Secondary | ICD-10-CM | POA: Diagnosis not present

## 2016-04-16 NOTE — Progress Notes (Signed)
Agree with reviewed plan as described and was available for consultation. Hoyt Koch, MD

## 2016-04-17 LAB — PROTIME-INR: INR: 2 — AB (ref <=1.1)

## 2016-04-18 DIAGNOSIS — D509 Iron deficiency anemia, unspecified: Secondary | ICD-10-CM | POA: Diagnosis not present

## 2016-04-18 DIAGNOSIS — E1129 Type 2 diabetes mellitus with other diabetic kidney complication: Secondary | ICD-10-CM | POA: Diagnosis not present

## 2016-04-18 DIAGNOSIS — N2581 Secondary hyperparathyroidism of renal origin: Secondary | ICD-10-CM | POA: Diagnosis not present

## 2016-04-18 DIAGNOSIS — N186 End stage renal disease: Secondary | ICD-10-CM | POA: Diagnosis not present

## 2016-04-18 DIAGNOSIS — E1122 Type 2 diabetes mellitus with diabetic chronic kidney disease: Secondary | ICD-10-CM | POA: Diagnosis not present

## 2016-04-18 DIAGNOSIS — D631 Anemia in chronic kidney disease: Secondary | ICD-10-CM | POA: Diagnosis not present

## 2016-04-21 DIAGNOSIS — E1122 Type 2 diabetes mellitus with diabetic chronic kidney disease: Secondary | ICD-10-CM | POA: Diagnosis not present

## 2016-04-21 DIAGNOSIS — E1129 Type 2 diabetes mellitus with other diabetic kidney complication: Secondary | ICD-10-CM | POA: Diagnosis not present

## 2016-04-21 DIAGNOSIS — N186 End stage renal disease: Secondary | ICD-10-CM | POA: Diagnosis not present

## 2016-04-21 DIAGNOSIS — D631 Anemia in chronic kidney disease: Secondary | ICD-10-CM | POA: Diagnosis not present

## 2016-04-21 DIAGNOSIS — D509 Iron deficiency anemia, unspecified: Secondary | ICD-10-CM | POA: Diagnosis not present

## 2016-04-21 DIAGNOSIS — N2581 Secondary hyperparathyroidism of renal origin: Secondary | ICD-10-CM | POA: Diagnosis not present

## 2016-04-22 ENCOUNTER — Ambulatory Visit (INDEPENDENT_AMBULATORY_CARE_PROVIDER_SITE_OTHER): Payer: Medicare Other | Admitting: General Practice

## 2016-04-22 NOTE — Progress Notes (Signed)
I have reviewed and agree with the plan. 

## 2016-04-22 NOTE — Progress Notes (Signed)
Pre visit review using our clinic review tool, if applicable. No additional management support is needed unless otherwise documented below in the visit note. 

## 2016-04-23 DIAGNOSIS — I82492 Acute embolism and thrombosis of other specified deep vein of left lower extremity: Secondary | ICD-10-CM | POA: Diagnosis not present

## 2016-04-23 DIAGNOSIS — N186 End stage renal disease: Secondary | ICD-10-CM | POA: Diagnosis not present

## 2016-04-23 DIAGNOSIS — N2581 Secondary hyperparathyroidism of renal origin: Secondary | ICD-10-CM | POA: Diagnosis not present

## 2016-04-23 DIAGNOSIS — E1122 Type 2 diabetes mellitus with diabetic chronic kidney disease: Secondary | ICD-10-CM | POA: Diagnosis not present

## 2016-04-23 DIAGNOSIS — E1129 Type 2 diabetes mellitus with other diabetic kidney complication: Secondary | ICD-10-CM | POA: Diagnosis not present

## 2016-04-23 DIAGNOSIS — D509 Iron deficiency anemia, unspecified: Secondary | ICD-10-CM | POA: Diagnosis not present

## 2016-04-23 DIAGNOSIS — D631 Anemia in chronic kidney disease: Secondary | ICD-10-CM | POA: Diagnosis not present

## 2016-04-23 LAB — POCT INR: INR: 2.06

## 2016-04-25 DIAGNOSIS — D509 Iron deficiency anemia, unspecified: Secondary | ICD-10-CM | POA: Diagnosis not present

## 2016-04-25 DIAGNOSIS — N2581 Secondary hyperparathyroidism of renal origin: Secondary | ICD-10-CM | POA: Diagnosis not present

## 2016-04-25 DIAGNOSIS — N186 End stage renal disease: Secondary | ICD-10-CM | POA: Diagnosis not present

## 2016-04-25 DIAGNOSIS — E1122 Type 2 diabetes mellitus with diabetic chronic kidney disease: Secondary | ICD-10-CM | POA: Diagnosis not present

## 2016-04-25 DIAGNOSIS — E1129 Type 2 diabetes mellitus with other diabetic kidney complication: Secondary | ICD-10-CM | POA: Diagnosis not present

## 2016-04-25 DIAGNOSIS — D631 Anemia in chronic kidney disease: Secondary | ICD-10-CM | POA: Diagnosis not present

## 2016-04-28 DIAGNOSIS — E1129 Type 2 diabetes mellitus with other diabetic kidney complication: Secondary | ICD-10-CM | POA: Diagnosis not present

## 2016-04-28 DIAGNOSIS — N186 End stage renal disease: Secondary | ICD-10-CM | POA: Diagnosis not present

## 2016-04-28 DIAGNOSIS — D631 Anemia in chronic kidney disease: Secondary | ICD-10-CM | POA: Diagnosis not present

## 2016-04-28 DIAGNOSIS — D509 Iron deficiency anemia, unspecified: Secondary | ICD-10-CM | POA: Diagnosis not present

## 2016-04-28 DIAGNOSIS — E1122 Type 2 diabetes mellitus with diabetic chronic kidney disease: Secondary | ICD-10-CM | POA: Diagnosis not present

## 2016-04-28 DIAGNOSIS — N2581 Secondary hyperparathyroidism of renal origin: Secondary | ICD-10-CM | POA: Diagnosis not present

## 2016-04-29 ENCOUNTER — Ambulatory Visit (INDEPENDENT_AMBULATORY_CARE_PROVIDER_SITE_OTHER): Payer: Medicare Other | Admitting: General Practice

## 2016-04-29 DIAGNOSIS — N186 End stage renal disease: Secondary | ICD-10-CM | POA: Diagnosis not present

## 2016-04-29 DIAGNOSIS — E8770 Fluid overload, unspecified: Secondary | ICD-10-CM | POA: Diagnosis not present

## 2016-04-29 NOTE — Progress Notes (Signed)
Pre visit review using our clinic review tool, if applicable. No additional management support is needed unless otherwise documented below in the visit note. 

## 2016-04-29 NOTE — Progress Notes (Signed)
I have reviewed and agree with the plan. 

## 2016-04-30 DIAGNOSIS — N186 End stage renal disease: Secondary | ICD-10-CM | POA: Diagnosis not present

## 2016-04-30 DIAGNOSIS — D631 Anemia in chronic kidney disease: Secondary | ICD-10-CM | POA: Diagnosis not present

## 2016-04-30 DIAGNOSIS — D509 Iron deficiency anemia, unspecified: Secondary | ICD-10-CM | POA: Diagnosis not present

## 2016-04-30 DIAGNOSIS — E1122 Type 2 diabetes mellitus with diabetic chronic kidney disease: Secondary | ICD-10-CM | POA: Diagnosis not present

## 2016-04-30 DIAGNOSIS — I82492 Acute embolism and thrombosis of other specified deep vein of left lower extremity: Secondary | ICD-10-CM | POA: Diagnosis not present

## 2016-04-30 DIAGNOSIS — N2581 Secondary hyperparathyroidism of renal origin: Secondary | ICD-10-CM | POA: Diagnosis not present

## 2016-04-30 DIAGNOSIS — E1129 Type 2 diabetes mellitus with other diabetic kidney complication: Secondary | ICD-10-CM | POA: Diagnosis not present

## 2016-04-30 LAB — POCT INR: INR: 1.76

## 2016-05-02 DIAGNOSIS — E1129 Type 2 diabetes mellitus with other diabetic kidney complication: Secondary | ICD-10-CM | POA: Diagnosis not present

## 2016-05-02 DIAGNOSIS — E1122 Type 2 diabetes mellitus with diabetic chronic kidney disease: Secondary | ICD-10-CM | POA: Diagnosis not present

## 2016-05-02 DIAGNOSIS — D509 Iron deficiency anemia, unspecified: Secondary | ICD-10-CM | POA: Diagnosis not present

## 2016-05-02 DIAGNOSIS — D631 Anemia in chronic kidney disease: Secondary | ICD-10-CM | POA: Diagnosis not present

## 2016-05-02 DIAGNOSIS — N2581 Secondary hyperparathyroidism of renal origin: Secondary | ICD-10-CM | POA: Diagnosis not present

## 2016-05-02 DIAGNOSIS — N186 End stage renal disease: Secondary | ICD-10-CM | POA: Diagnosis not present

## 2016-05-05 DIAGNOSIS — D509 Iron deficiency anemia, unspecified: Secondary | ICD-10-CM | POA: Diagnosis not present

## 2016-05-05 DIAGNOSIS — E1129 Type 2 diabetes mellitus with other diabetic kidney complication: Secondary | ICD-10-CM | POA: Diagnosis not present

## 2016-05-05 DIAGNOSIS — N2581 Secondary hyperparathyroidism of renal origin: Secondary | ICD-10-CM | POA: Diagnosis not present

## 2016-05-05 DIAGNOSIS — E1122 Type 2 diabetes mellitus with diabetic chronic kidney disease: Secondary | ICD-10-CM | POA: Diagnosis not present

## 2016-05-05 DIAGNOSIS — D631 Anemia in chronic kidney disease: Secondary | ICD-10-CM | POA: Diagnosis not present

## 2016-05-05 DIAGNOSIS — N186 End stage renal disease: Secondary | ICD-10-CM | POA: Diagnosis not present

## 2016-05-06 ENCOUNTER — Ambulatory Visit (INDEPENDENT_AMBULATORY_CARE_PROVIDER_SITE_OTHER): Payer: Medicare Other | Admitting: General Practice

## 2016-05-06 NOTE — Progress Notes (Signed)
I have reviewed and agree with the plan. 

## 2016-05-07 DIAGNOSIS — N186 End stage renal disease: Secondary | ICD-10-CM | POA: Diagnosis not present

## 2016-05-07 DIAGNOSIS — E1122 Type 2 diabetes mellitus with diabetic chronic kidney disease: Secondary | ICD-10-CM | POA: Diagnosis not present

## 2016-05-07 DIAGNOSIS — N2581 Secondary hyperparathyroidism of renal origin: Secondary | ICD-10-CM | POA: Diagnosis not present

## 2016-05-07 DIAGNOSIS — D631 Anemia in chronic kidney disease: Secondary | ICD-10-CM | POA: Diagnosis not present

## 2016-05-07 DIAGNOSIS — I82492 Acute embolism and thrombosis of other specified deep vein of left lower extremity: Secondary | ICD-10-CM | POA: Diagnosis not present

## 2016-05-07 DIAGNOSIS — M109 Gout, unspecified: Secondary | ICD-10-CM | POA: Diagnosis not present

## 2016-05-07 DIAGNOSIS — D509 Iron deficiency anemia, unspecified: Secondary | ICD-10-CM | POA: Diagnosis not present

## 2016-05-07 DIAGNOSIS — E1129 Type 2 diabetes mellitus with other diabetic kidney complication: Secondary | ICD-10-CM | POA: Diagnosis not present

## 2016-05-09 DIAGNOSIS — E1129 Type 2 diabetes mellitus with other diabetic kidney complication: Secondary | ICD-10-CM | POA: Diagnosis not present

## 2016-05-09 DIAGNOSIS — D509 Iron deficiency anemia, unspecified: Secondary | ICD-10-CM | POA: Diagnosis not present

## 2016-05-09 DIAGNOSIS — N2581 Secondary hyperparathyroidism of renal origin: Secondary | ICD-10-CM | POA: Diagnosis not present

## 2016-05-09 DIAGNOSIS — E1122 Type 2 diabetes mellitus with diabetic chronic kidney disease: Secondary | ICD-10-CM | POA: Diagnosis not present

## 2016-05-09 DIAGNOSIS — D631 Anemia in chronic kidney disease: Secondary | ICD-10-CM | POA: Diagnosis not present

## 2016-05-09 DIAGNOSIS — N186 End stage renal disease: Secondary | ICD-10-CM | POA: Diagnosis not present

## 2016-05-11 ENCOUNTER — Encounter: Payer: Self-pay | Admitting: Internal Medicine

## 2016-05-11 ENCOUNTER — Ambulatory Visit (INDEPENDENT_AMBULATORY_CARE_PROVIDER_SITE_OTHER): Payer: Medicare Other | Admitting: Internal Medicine

## 2016-05-11 ENCOUNTER — Telehealth: Payer: Self-pay

## 2016-05-11 ENCOUNTER — Other Ambulatory Visit (INDEPENDENT_AMBULATORY_CARE_PROVIDER_SITE_OTHER): Payer: Medicare Other

## 2016-05-11 VITALS — BP 130/62 | HR 90 | Temp 97.8°F | Wt 245.0 lb

## 2016-05-11 DIAGNOSIS — R413 Other amnesia: Secondary | ICD-10-CM | POA: Diagnosis not present

## 2016-05-11 DIAGNOSIS — I10 Essential (primary) hypertension: Secondary | ICD-10-CM

## 2016-05-11 DIAGNOSIS — N186 End stage renal disease: Secondary | ICD-10-CM | POA: Diagnosis not present

## 2016-05-11 DIAGNOSIS — Z940 Kidney transplant status: Secondary | ICD-10-CM

## 2016-05-11 DIAGNOSIS — Z794 Long term (current) use of insulin: Secondary | ICD-10-CM

## 2016-05-11 DIAGNOSIS — N400 Enlarged prostate without lower urinary tract symptoms: Secondary | ICD-10-CM

## 2016-05-11 DIAGNOSIS — Z992 Dependence on renal dialysis: Secondary | ICD-10-CM | POA: Diagnosis not present

## 2016-05-11 DIAGNOSIS — E1122 Type 2 diabetes mellitus with diabetic chronic kidney disease: Secondary | ICD-10-CM | POA: Diagnosis not present

## 2016-05-11 DIAGNOSIS — E1121 Type 2 diabetes mellitus with diabetic nephropathy: Secondary | ICD-10-CM

## 2016-05-11 LAB — BASIC METABOLIC PANEL
BUN: 66 mg/dL — ABNORMAL HIGH (ref 6–23)
CO2: 28 mEq/L (ref 19–32)
Calcium: 9.8 mg/dL (ref 8.4–10.5)
Chloride: 99 mEq/L (ref 96–112)
Creatinine, Ser: 9.35 mg/dL (ref 0.40–1.50)
GFR: 6.31 mL/min — CL (ref >60.00)
Glucose, Bld: 42 mg/dL — CL (ref 70–99)
Potassium: 5.6 mEq/L — ABNORMAL HIGH (ref 3.5–5.1)
Sodium: 140 mEq/L (ref 135–145)

## 2016-05-11 LAB — TSH: TSH: 3.02 u[IU]/mL (ref 0.35–4.50)

## 2016-05-11 LAB — FOLATE: Folate: 10.9 ng/mL (ref >5.9)

## 2016-05-11 LAB — VITAMIN B12: Vitamin B-12: 379 pg/mL (ref 211–911)

## 2016-05-11 LAB — T4, FREE: Free T4: 0.75 ng/dL (ref 0.60–1.60)

## 2016-05-11 MED ORDER — TADALAFIL 5 MG PO TABS: 5.0000 mg | ORAL_TABLET | Freq: Every day | ORAL | 3 refills | Status: DC

## 2016-05-11 NOTE — Telephone Encounter (Signed)
Please advise patient had critical lab values  Glucose-42 Creatine-9.35 GFR-6.31

## 2016-05-11 NOTE — Progress Notes (Signed)
Pre visit review using our clinic review tool, if applicable. No additional management support is needed unless otherwise documented below in the visit note. 

## 2016-05-11 NOTE — Progress Notes (Signed)
   Subjective:    Patient ID: [name] [name], male    DOB: 02/26/1966, 50 y.o.   MRN: [id number]  HPI The patient is a 50 YO man coming in for follow up of his blood pressure. He is still taking his medications. Denies headaches or chest pains or SOB. He has not seen his endocrine doctor as he may have had an appointment which he forgot and has not called to reschedule. He also has a visit with his post transplant team although now he is back on dialysis for transplant failure and did not tell them. He is hoping to get back on the list for another kidney but is not sure how to go about that. He is hoping that I can do this for him.   Review of Systems  Constitutional: Positive for fatigue. Negative for activity change, appetite change, chills and fever.  Respiratory: Negative for cough, chest tightness, shortness of breath and wheezing.        No hemoptysis  Cardiovascular: Negative for chest pain, palpitations and leg swelling.  Gastrointestinal: Negative for abdominal distention, abdominal pain, constipation, diarrhea and nausea.  Musculoskeletal: Positive for arthralgias, back pain, gait problem and myalgias.  Skin: Negative.   Neurological: Positive for weakness and numbness. Negative for dizziness, syncope, light-headedness and headaches.      Objective:   Physical Exam  Constitutional: He is oriented to person, place, and time. He appears well-developed and well-nourished.  Morbidly obese, uses scooter for ambulation.   HENT:  Head: Normocephalic and atraumatic.  Eyes: EOM are normal.  Neck: Normal range of motion. No JVD present.  Cardiovascular: Normal rate.   Pulmonary/Chest: Effort normal and breath sounds normal. No respiratory distress. He has no wheezes.  Abdominal: Soft. Bowel sounds are normal. He exhibits no distension. There is no tenderness. There is no rebound.  Musculoskeletal: He exhibits no edema.  Neurological: He is alert and oriented to person, place, and time.  Coordination abnormal.  Skin: Skin is warm and dry.  Psychiatric: He has a normal mood and affect.   Vitals:   05/11/16 1116  BP: 130/62  Pulse: 90  Temp: 97.8 F (36.6 C)  SpO2: 98%  Weight: 245 lb (111.1 kg)      Assessment & Plan:

## 2016-05-11 NOTE — Telephone Encounter (Signed)
Expected, thanks

## 2016-05-11 NOTE — Patient Instructions (Signed)
We will check the blood work today and call you back with the results.   The number for Dr. Dwyane Dee to check on the sugars is [phone].  We have sent in a prescription for the cialis and it may have to go through approval with the insurance company and they may approve this medication. This is up to your insurance company.

## 2016-05-12 DIAGNOSIS — E1122 Type 2 diabetes mellitus with diabetic chronic kidney disease: Secondary | ICD-10-CM | POA: Diagnosis not present

## 2016-05-12 DIAGNOSIS — N186 End stage renal disease: Secondary | ICD-10-CM | POA: Diagnosis not present

## 2016-05-12 DIAGNOSIS — E8779 Other fluid overload: Secondary | ICD-10-CM | POA: Diagnosis not present

## 2016-05-12 DIAGNOSIS — Z992 Dependence on renal dialysis: Secondary | ICD-10-CM | POA: Diagnosis not present

## 2016-05-12 DIAGNOSIS — N2581 Secondary hyperparathyroidism of renal origin: Secondary | ICD-10-CM | POA: Diagnosis not present

## 2016-05-12 DIAGNOSIS — D509 Iron deficiency anemia, unspecified: Secondary | ICD-10-CM | POA: Diagnosis not present

## 2016-05-12 DIAGNOSIS — E1129 Type 2 diabetes mellitus with other diabetic kidney complication: Secondary | ICD-10-CM | POA: Diagnosis not present

## 2016-05-12 NOTE — Assessment & Plan Note (Signed)
He is reminded to return to his endocrinologist and given their phone number today so that he can call to reschedule. He did not bring his sugar log today and could not tell me values of his sugars. It is unclear if he is testing consistently.

## 2016-05-12 NOTE — Assessment & Plan Note (Signed)
Advised him that I do not handle any transplant lists and he needs to go to his appointment this week and talk with them about this. As well it is unclear to me if he still needs his anti-rejection meds since his transplant has failed.

## 2016-05-12 NOTE — Assessment & Plan Note (Signed)
This is exacerbated by the fact that he uses electric scooter exclusively and is able to make transfers and walk small distances.

## 2016-05-12 NOTE — Assessment & Plan Note (Signed)
BP at goal today on his clonidine, lasix. He does complain of some low BP on dialysis. He does not know the values and denies syncope or pre-syncope.

## 2016-05-12 NOTE — Assessment & Plan Note (Signed)
He does not want to see his urologist again, rx for his daily cialis given today and informed him that this would require prior authorization and this was not guaranteed.

## 2016-05-13 DIAGNOSIS — I120 Hypertensive chronic kidney disease with stage 5 chronic kidney disease or end stage renal disease: Secondary | ICD-10-CM | POA: Diagnosis not present

## 2016-05-13 DIAGNOSIS — E1122 Type 2 diabetes mellitus with diabetic chronic kidney disease: Secondary | ICD-10-CM | POA: Diagnosis not present

## 2016-05-13 DIAGNOSIS — D899 Disorder involving the immune mechanism, unspecified: Secondary | ICD-10-CM | POA: Diagnosis not present

## 2016-05-13 DIAGNOSIS — N186 End stage renal disease: Secondary | ICD-10-CM | POA: Diagnosis not present

## 2016-05-13 DIAGNOSIS — Z4822 Encounter for aftercare following kidney transplant: Secondary | ICD-10-CM | POA: Diagnosis not present

## 2016-05-13 DIAGNOSIS — I82409 Acute embolism and thrombosis of unspecified deep veins of unspecified lower extremity: Secondary | ICD-10-CM | POA: Diagnosis not present

## 2016-05-13 DIAGNOSIS — Z79899 Other long term (current) drug therapy: Secondary | ICD-10-CM | POA: Diagnosis not present

## 2016-05-13 DIAGNOSIS — Z880 Allergy status to penicillin: Secondary | ICD-10-CM | POA: Diagnosis not present

## 2016-05-13 DIAGNOSIS — Z7901 Long term (current) use of anticoagulants: Secondary | ICD-10-CM | POA: Diagnosis not present

## 2016-05-14 DIAGNOSIS — N2581 Secondary hyperparathyroidism of renal origin: Secondary | ICD-10-CM | POA: Diagnosis not present

## 2016-05-14 DIAGNOSIS — N186 End stage renal disease: Secondary | ICD-10-CM | POA: Diagnosis not present

## 2016-05-14 DIAGNOSIS — D509 Iron deficiency anemia, unspecified: Secondary | ICD-10-CM | POA: Diagnosis not present

## 2016-05-14 DIAGNOSIS — E1122 Type 2 diabetes mellitus with diabetic chronic kidney disease: Secondary | ICD-10-CM | POA: Diagnosis not present

## 2016-05-14 DIAGNOSIS — Z992 Dependence on renal dialysis: Secondary | ICD-10-CM | POA: Diagnosis not present

## 2016-05-14 DIAGNOSIS — I82492 Acute embolism and thrombosis of other specified deep vein of left lower extremity: Secondary | ICD-10-CM | POA: Diagnosis not present

## 2016-05-14 LAB — PROTIME-INR: INR: 1.6 — AB (ref <=1.1)

## 2016-05-15 DIAGNOSIS — Z992 Dependence on renal dialysis: Secondary | ICD-10-CM | POA: Diagnosis not present

## 2016-05-15 DIAGNOSIS — D509 Iron deficiency anemia, unspecified: Secondary | ICD-10-CM | POA: Diagnosis not present

## 2016-05-15 DIAGNOSIS — E1122 Type 2 diabetes mellitus with diabetic chronic kidney disease: Secondary | ICD-10-CM | POA: Diagnosis not present

## 2016-05-15 DIAGNOSIS — N2581 Secondary hyperparathyroidism of renal origin: Secondary | ICD-10-CM | POA: Diagnosis not present

## 2016-05-15 DIAGNOSIS — N186 End stage renal disease: Secondary | ICD-10-CM | POA: Diagnosis not present

## 2016-05-16 DIAGNOSIS — D509 Iron deficiency anemia, unspecified: Secondary | ICD-10-CM | POA: Diagnosis not present

## 2016-05-16 DIAGNOSIS — Z992 Dependence on renal dialysis: Secondary | ICD-10-CM | POA: Diagnosis not present

## 2016-05-16 DIAGNOSIS — N186 End stage renal disease: Secondary | ICD-10-CM | POA: Diagnosis not present

## 2016-05-16 DIAGNOSIS — E1122 Type 2 diabetes mellitus with diabetic chronic kidney disease: Secondary | ICD-10-CM | POA: Diagnosis not present

## 2016-05-16 DIAGNOSIS — N2581 Secondary hyperparathyroidism of renal origin: Secondary | ICD-10-CM | POA: Diagnosis not present

## 2016-05-19 DIAGNOSIS — E1122 Type 2 diabetes mellitus with diabetic chronic kidney disease: Secondary | ICD-10-CM | POA: Diagnosis not present

## 2016-05-19 DIAGNOSIS — N2581 Secondary hyperparathyroidism of renal origin: Secondary | ICD-10-CM | POA: Diagnosis not present

## 2016-05-19 DIAGNOSIS — N186 End stage renal disease: Secondary | ICD-10-CM | POA: Diagnosis not present

## 2016-05-19 DIAGNOSIS — D509 Iron deficiency anemia, unspecified: Secondary | ICD-10-CM | POA: Diagnosis not present

## 2016-05-19 DIAGNOSIS — Z992 Dependence on renal dialysis: Secondary | ICD-10-CM | POA: Diagnosis not present

## 2016-05-20 ENCOUNTER — Ambulatory Visit (INDEPENDENT_AMBULATORY_CARE_PROVIDER_SITE_OTHER): Payer: Medicare Other | Admitting: General Practice

## 2016-05-20 ENCOUNTER — Ambulatory Visit: Payer: Medicare Other | Admitting: Podiatry

## 2016-05-20 NOTE — Progress Notes (Signed)
I have reviewed and agree with the plan. 

## 2016-05-21 DIAGNOSIS — Z992 Dependence on renal dialysis: Secondary | ICD-10-CM | POA: Diagnosis not present

## 2016-05-21 DIAGNOSIS — D509 Iron deficiency anemia, unspecified: Secondary | ICD-10-CM | POA: Diagnosis not present

## 2016-05-21 DIAGNOSIS — N186 End stage renal disease: Secondary | ICD-10-CM | POA: Diagnosis not present

## 2016-05-21 DIAGNOSIS — E1122 Type 2 diabetes mellitus with diabetic chronic kidney disease: Secondary | ICD-10-CM | POA: Diagnosis not present

## 2016-05-21 DIAGNOSIS — I82492 Acute embolism and thrombosis of other specified deep vein of left lower extremity: Secondary | ICD-10-CM | POA: Diagnosis not present

## 2016-05-21 DIAGNOSIS — N2581 Secondary hyperparathyroidism of renal origin: Secondary | ICD-10-CM | POA: Diagnosis not present

## 2016-05-22 LAB — PROTIME-INR: INR: 1.7 — AB (ref <=1.1)

## 2016-05-23 DIAGNOSIS — N186 End stage renal disease: Secondary | ICD-10-CM | POA: Diagnosis not present

## 2016-05-23 DIAGNOSIS — Z992 Dependence on renal dialysis: Secondary | ICD-10-CM | POA: Diagnosis not present

## 2016-05-23 DIAGNOSIS — D509 Iron deficiency anemia, unspecified: Secondary | ICD-10-CM | POA: Diagnosis not present

## 2016-05-23 DIAGNOSIS — N2581 Secondary hyperparathyroidism of renal origin: Secondary | ICD-10-CM | POA: Diagnosis not present

## 2016-05-23 DIAGNOSIS — E1122 Type 2 diabetes mellitus with diabetic chronic kidney disease: Secondary | ICD-10-CM | POA: Diagnosis not present

## 2016-05-26 ENCOUNTER — Ambulatory Visit (INDEPENDENT_AMBULATORY_CARE_PROVIDER_SITE_OTHER): Payer: Self-pay | Admitting: General Practice

## 2016-05-26 DIAGNOSIS — E1122 Type 2 diabetes mellitus with diabetic chronic kidney disease: Secondary | ICD-10-CM | POA: Diagnosis not present

## 2016-05-26 DIAGNOSIS — N186 End stage renal disease: Secondary | ICD-10-CM | POA: Diagnosis not present

## 2016-05-26 DIAGNOSIS — D509 Iron deficiency anemia, unspecified: Secondary | ICD-10-CM | POA: Diagnosis not present

## 2016-05-26 DIAGNOSIS — Z992 Dependence on renal dialysis: Secondary | ICD-10-CM | POA: Diagnosis not present

## 2016-05-26 DIAGNOSIS — N2581 Secondary hyperparathyroidism of renal origin: Secondary | ICD-10-CM | POA: Diagnosis not present

## 2016-05-26 NOTE — Progress Notes (Signed)
I have reviewed and agree with the plan. 

## 2016-05-27 ENCOUNTER — Ambulatory Visit: Payer: Medicare Other | Admitting: Podiatry

## 2016-05-27 DIAGNOSIS — E103553 Type 1 diabetes mellitus with stable proliferative diabetic retinopathy, bilateral: Secondary | ICD-10-CM | POA: Diagnosis not present

## 2016-05-28 DIAGNOSIS — N2581 Secondary hyperparathyroidism of renal origin: Secondary | ICD-10-CM | POA: Diagnosis not present

## 2016-05-28 DIAGNOSIS — I82492 Acute embolism and thrombosis of other specified deep vein of left lower extremity: Secondary | ICD-10-CM | POA: Diagnosis not present

## 2016-05-28 DIAGNOSIS — N186 End stage renal disease: Secondary | ICD-10-CM | POA: Diagnosis not present

## 2016-05-28 DIAGNOSIS — Z992 Dependence on renal dialysis: Secondary | ICD-10-CM | POA: Diagnosis not present

## 2016-05-28 DIAGNOSIS — E1122 Type 2 diabetes mellitus with diabetic chronic kidney disease: Secondary | ICD-10-CM | POA: Diagnosis not present

## 2016-05-28 DIAGNOSIS — D509 Iron deficiency anemia, unspecified: Secondary | ICD-10-CM | POA: Diagnosis not present

## 2016-05-28 LAB — POCT INR: INR: 1.7

## 2016-05-30 DIAGNOSIS — N2581 Secondary hyperparathyroidism of renal origin: Secondary | ICD-10-CM | POA: Diagnosis not present

## 2016-05-30 DIAGNOSIS — N186 End stage renal disease: Secondary | ICD-10-CM | POA: Diagnosis not present

## 2016-05-30 DIAGNOSIS — Z992 Dependence on renal dialysis: Secondary | ICD-10-CM | POA: Diagnosis not present

## 2016-05-30 DIAGNOSIS — E1122 Type 2 diabetes mellitus with diabetic chronic kidney disease: Secondary | ICD-10-CM | POA: Diagnosis not present

## 2016-05-30 DIAGNOSIS — D509 Iron deficiency anemia, unspecified: Secondary | ICD-10-CM | POA: Diagnosis not present

## 2016-06-02 ENCOUNTER — Ambulatory Visit: Payer: Self-pay | Admitting: General Practice

## 2016-06-02 DIAGNOSIS — N2581 Secondary hyperparathyroidism of renal origin: Secondary | ICD-10-CM | POA: Diagnosis not present

## 2016-06-02 DIAGNOSIS — Z992 Dependence on renal dialysis: Secondary | ICD-10-CM | POA: Diagnosis not present

## 2016-06-02 DIAGNOSIS — D509 Iron deficiency anemia, unspecified: Secondary | ICD-10-CM | POA: Diagnosis not present

## 2016-06-02 DIAGNOSIS — N186 End stage renal disease: Secondary | ICD-10-CM | POA: Diagnosis not present

## 2016-06-02 DIAGNOSIS — E1122 Type 2 diabetes mellitus with diabetic chronic kidney disease: Secondary | ICD-10-CM | POA: Diagnosis not present

## 2016-06-02 NOTE — Progress Notes (Signed)
Medical treatment/procedure(s) were performed by non-physician practitioner and as supervising physician I was immediately available for consultation/collaboration. I agree with above. Rayshawn Maney A Gemini Bunte, MD  

## 2016-06-02 NOTE — Progress Notes (Signed)
I have reviewed and agree with the plan. 

## 2016-06-04 DIAGNOSIS — Z992 Dependence on renal dialysis: Secondary | ICD-10-CM | POA: Diagnosis not present

## 2016-06-04 DIAGNOSIS — E1122 Type 2 diabetes mellitus with diabetic chronic kidney disease: Secondary | ICD-10-CM | POA: Diagnosis not present

## 2016-06-04 DIAGNOSIS — E8770 Fluid overload, unspecified: Secondary | ICD-10-CM | POA: Insufficient documentation

## 2016-06-04 DIAGNOSIS — M109 Gout, unspecified: Secondary | ICD-10-CM | POA: Diagnosis not present

## 2016-06-04 DIAGNOSIS — N186 End stage renal disease: Secondary | ICD-10-CM | POA: Diagnosis not present

## 2016-06-04 DIAGNOSIS — N2581 Secondary hyperparathyroidism of renal origin: Secondary | ICD-10-CM | POA: Diagnosis not present

## 2016-06-04 DIAGNOSIS — I82492 Acute embolism and thrombosis of other specified deep vein of left lower extremity: Secondary | ICD-10-CM | POA: Diagnosis not present

## 2016-06-04 DIAGNOSIS — D509 Iron deficiency anemia, unspecified: Secondary | ICD-10-CM | POA: Diagnosis not present

## 2016-06-05 LAB — PROTIME-INR: INR: 2.2 — AB (ref <=1.1)

## 2016-06-06 DIAGNOSIS — N186 End stage renal disease: Secondary | ICD-10-CM | POA: Diagnosis not present

## 2016-06-06 DIAGNOSIS — E1122 Type 2 diabetes mellitus with diabetic chronic kidney disease: Secondary | ICD-10-CM | POA: Diagnosis not present

## 2016-06-06 DIAGNOSIS — N2581 Secondary hyperparathyroidism of renal origin: Secondary | ICD-10-CM | POA: Diagnosis not present

## 2016-06-06 DIAGNOSIS — D509 Iron deficiency anemia, unspecified: Secondary | ICD-10-CM | POA: Diagnosis not present

## 2016-06-06 DIAGNOSIS — Z992 Dependence on renal dialysis: Secondary | ICD-10-CM | POA: Diagnosis not present

## 2016-06-09 DIAGNOSIS — E1122 Type 2 diabetes mellitus with diabetic chronic kidney disease: Secondary | ICD-10-CM | POA: Diagnosis not present

## 2016-06-09 DIAGNOSIS — Z992 Dependence on renal dialysis: Secondary | ICD-10-CM | POA: Diagnosis not present

## 2016-06-09 DIAGNOSIS — N186 End stage renal disease: Secondary | ICD-10-CM | POA: Diagnosis not present

## 2016-06-09 DIAGNOSIS — N2581 Secondary hyperparathyroidism of renal origin: Secondary | ICD-10-CM | POA: Diagnosis not present

## 2016-06-09 DIAGNOSIS — D509 Iron deficiency anemia, unspecified: Secondary | ICD-10-CM | POA: Diagnosis not present

## 2016-06-11 DIAGNOSIS — Z992 Dependence on renal dialysis: Secondary | ICD-10-CM | POA: Diagnosis not present

## 2016-06-11 DIAGNOSIS — E1122 Type 2 diabetes mellitus with diabetic chronic kidney disease: Secondary | ICD-10-CM | POA: Diagnosis not present

## 2016-06-11 DIAGNOSIS — N2581 Secondary hyperparathyroidism of renal origin: Secondary | ICD-10-CM | POA: Diagnosis not present

## 2016-06-11 DIAGNOSIS — N186 End stage renal disease: Secondary | ICD-10-CM | POA: Diagnosis not present

## 2016-06-11 DIAGNOSIS — D509 Iron deficiency anemia, unspecified: Secondary | ICD-10-CM | POA: Diagnosis not present

## 2016-06-13 DIAGNOSIS — E1129 Type 2 diabetes mellitus with other diabetic kidney complication: Secondary | ICD-10-CM | POA: Diagnosis not present

## 2016-06-13 DIAGNOSIS — N2581 Secondary hyperparathyroidism of renal origin: Secondary | ICD-10-CM | POA: Diagnosis not present

## 2016-06-13 DIAGNOSIS — D509 Iron deficiency anemia, unspecified: Secondary | ICD-10-CM | POA: Diagnosis not present

## 2016-06-13 DIAGNOSIS — E1122 Type 2 diabetes mellitus with diabetic chronic kidney disease: Secondary | ICD-10-CM | POA: Diagnosis not present

## 2016-06-13 DIAGNOSIS — Z992 Dependence on renal dialysis: Secondary | ICD-10-CM | POA: Diagnosis not present

## 2016-06-13 DIAGNOSIS — N186 End stage renal disease: Secondary | ICD-10-CM | POA: Diagnosis not present

## 2016-06-13 DIAGNOSIS — Z23 Encounter for immunization: Secondary | ICD-10-CM | POA: Diagnosis not present

## 2016-06-16 ENCOUNTER — Ambulatory Visit: Payer: Self-pay | Admitting: General Practice

## 2016-06-16 DIAGNOSIS — D509 Iron deficiency anemia, unspecified: Secondary | ICD-10-CM | POA: Diagnosis not present

## 2016-06-16 DIAGNOSIS — N2581 Secondary hyperparathyroidism of renal origin: Secondary | ICD-10-CM | POA: Diagnosis not present

## 2016-06-16 DIAGNOSIS — Z992 Dependence on renal dialysis: Secondary | ICD-10-CM | POA: Diagnosis not present

## 2016-06-16 DIAGNOSIS — Z23 Encounter for immunization: Secondary | ICD-10-CM | POA: Diagnosis not present

## 2016-06-16 DIAGNOSIS — N186 End stage renal disease: Secondary | ICD-10-CM | POA: Diagnosis not present

## 2016-06-16 DIAGNOSIS — E1122 Type 2 diabetes mellitus with diabetic chronic kidney disease: Secondary | ICD-10-CM | POA: Diagnosis not present

## 2016-06-16 NOTE — Progress Notes (Signed)
I have reviewed and agree with the plan. 

## 2016-06-17 DIAGNOSIS — N186 End stage renal disease: Secondary | ICD-10-CM | POA: Diagnosis not present

## 2016-06-17 DIAGNOSIS — Z992 Dependence on renal dialysis: Secondary | ICD-10-CM | POA: Diagnosis not present

## 2016-06-17 DIAGNOSIS — I871 Compression of vein: Secondary | ICD-10-CM | POA: Diagnosis not present

## 2016-06-17 DIAGNOSIS — T82858D Stenosis of vascular prosthetic devices, implants and grafts, subsequent encounter: Secondary | ICD-10-CM | POA: Diagnosis not present

## 2016-06-18 DIAGNOSIS — N2581 Secondary hyperparathyroidism of renal origin: Secondary | ICD-10-CM | POA: Diagnosis not present

## 2016-06-18 DIAGNOSIS — D509 Iron deficiency anemia, unspecified: Secondary | ICD-10-CM | POA: Diagnosis not present

## 2016-06-18 DIAGNOSIS — E1122 Type 2 diabetes mellitus with diabetic chronic kidney disease: Secondary | ICD-10-CM | POA: Diagnosis not present

## 2016-06-18 DIAGNOSIS — Z992 Dependence on renal dialysis: Secondary | ICD-10-CM | POA: Diagnosis not present

## 2016-06-18 DIAGNOSIS — I82492 Acute embolism and thrombosis of other specified deep vein of left lower extremity: Secondary | ICD-10-CM | POA: Diagnosis not present

## 2016-06-18 DIAGNOSIS — N186 End stage renal disease: Secondary | ICD-10-CM | POA: Diagnosis not present

## 2016-06-18 DIAGNOSIS — Z23 Encounter for immunization: Secondary | ICD-10-CM | POA: Diagnosis not present

## 2016-06-19 LAB — PROTIME-INR: INR: 2.3 — AB (ref <=1.1)

## 2016-06-20 DIAGNOSIS — Z23 Encounter for immunization: Secondary | ICD-10-CM | POA: Diagnosis not present

## 2016-06-20 DIAGNOSIS — Z992 Dependence on renal dialysis: Secondary | ICD-10-CM | POA: Diagnosis not present

## 2016-06-20 DIAGNOSIS — N186 End stage renal disease: Secondary | ICD-10-CM | POA: Diagnosis not present

## 2016-06-20 DIAGNOSIS — N2581 Secondary hyperparathyroidism of renal origin: Secondary | ICD-10-CM | POA: Diagnosis not present

## 2016-06-20 DIAGNOSIS — D509 Iron deficiency anemia, unspecified: Secondary | ICD-10-CM | POA: Diagnosis not present

## 2016-06-20 DIAGNOSIS — E1122 Type 2 diabetes mellitus with diabetic chronic kidney disease: Secondary | ICD-10-CM | POA: Diagnosis not present

## 2016-06-23 DIAGNOSIS — D509 Iron deficiency anemia, unspecified: Secondary | ICD-10-CM | POA: Diagnosis not present

## 2016-06-23 DIAGNOSIS — Z992 Dependence on renal dialysis: Secondary | ICD-10-CM | POA: Diagnosis not present

## 2016-06-23 DIAGNOSIS — Z23 Encounter for immunization: Secondary | ICD-10-CM | POA: Diagnosis not present

## 2016-06-23 DIAGNOSIS — N2581 Secondary hyperparathyroidism of renal origin: Secondary | ICD-10-CM | POA: Diagnosis not present

## 2016-06-23 DIAGNOSIS — E1122 Type 2 diabetes mellitus with diabetic chronic kidney disease: Secondary | ICD-10-CM | POA: Diagnosis not present

## 2016-06-23 DIAGNOSIS — N186 End stage renal disease: Secondary | ICD-10-CM | POA: Diagnosis not present

## 2016-06-25 ENCOUNTER — Ambulatory Visit: Payer: Self-pay | Admitting: General Practice

## 2016-06-25 DIAGNOSIS — Z992 Dependence on renal dialysis: Secondary | ICD-10-CM | POA: Diagnosis not present

## 2016-06-25 DIAGNOSIS — Z23 Encounter for immunization: Secondary | ICD-10-CM | POA: Diagnosis not present

## 2016-06-25 DIAGNOSIS — N186 End stage renal disease: Secondary | ICD-10-CM | POA: Diagnosis not present

## 2016-06-25 DIAGNOSIS — D509 Iron deficiency anemia, unspecified: Secondary | ICD-10-CM | POA: Diagnosis not present

## 2016-06-25 DIAGNOSIS — I82492 Acute embolism and thrombosis of other specified deep vein of left lower extremity: Secondary | ICD-10-CM | POA: Diagnosis not present

## 2016-06-25 DIAGNOSIS — N2581 Secondary hyperparathyroidism of renal origin: Secondary | ICD-10-CM | POA: Diagnosis not present

## 2016-06-25 DIAGNOSIS — E1122 Type 2 diabetes mellitus with diabetic chronic kidney disease: Secondary | ICD-10-CM | POA: Diagnosis not present

## 2016-06-25 NOTE — Progress Notes (Signed)
I have reviewed and agree with the plan. 

## 2016-06-26 ENCOUNTER — Other Ambulatory Visit: Payer: Self-pay | Admitting: Internal Medicine

## 2016-06-26 ENCOUNTER — Other Ambulatory Visit: Payer: Self-pay | Admitting: Endocrinology

## 2016-06-27 DIAGNOSIS — Z23 Encounter for immunization: Secondary | ICD-10-CM | POA: Diagnosis not present

## 2016-06-27 DIAGNOSIS — Z992 Dependence on renal dialysis: Secondary | ICD-10-CM | POA: Diagnosis not present

## 2016-06-27 DIAGNOSIS — N186 End stage renal disease: Secondary | ICD-10-CM | POA: Diagnosis not present

## 2016-06-27 DIAGNOSIS — E1122 Type 2 diabetes mellitus with diabetic chronic kidney disease: Secondary | ICD-10-CM | POA: Diagnosis not present

## 2016-06-27 DIAGNOSIS — D509 Iron deficiency anemia, unspecified: Secondary | ICD-10-CM | POA: Diagnosis not present

## 2016-06-27 DIAGNOSIS — N2581 Secondary hyperparathyroidism of renal origin: Secondary | ICD-10-CM | POA: Diagnosis not present

## 2016-06-29 NOTE — Telephone Encounter (Signed)
Faxed to pharmacy

## 2016-06-30 ENCOUNTER — Ambulatory Visit: Payer: Self-pay | Admitting: General Practice

## 2016-06-30 DIAGNOSIS — E1122 Type 2 diabetes mellitus with diabetic chronic kidney disease: Secondary | ICD-10-CM | POA: Diagnosis not present

## 2016-06-30 DIAGNOSIS — Z992 Dependence on renal dialysis: Secondary | ICD-10-CM | POA: Diagnosis not present

## 2016-06-30 DIAGNOSIS — D509 Iron deficiency anemia, unspecified: Secondary | ICD-10-CM | POA: Diagnosis not present

## 2016-06-30 DIAGNOSIS — N186 End stage renal disease: Secondary | ICD-10-CM | POA: Diagnosis not present

## 2016-06-30 DIAGNOSIS — N2581 Secondary hyperparathyroidism of renal origin: Secondary | ICD-10-CM | POA: Diagnosis not present

## 2016-06-30 DIAGNOSIS — Z23 Encounter for immunization: Secondary | ICD-10-CM | POA: Diagnosis not present

## 2016-06-30 LAB — POCT INR: INR: 1.94

## 2016-06-30 NOTE — Progress Notes (Signed)
I have reviewed and agree with the plan. 

## 2016-07-02 DIAGNOSIS — N186 End stage renal disease: Secondary | ICD-10-CM | POA: Diagnosis not present

## 2016-07-02 DIAGNOSIS — D509 Iron deficiency anemia, unspecified: Secondary | ICD-10-CM | POA: Diagnosis not present

## 2016-07-02 DIAGNOSIS — I82492 Acute embolism and thrombosis of other specified deep vein of left lower extremity: Secondary | ICD-10-CM | POA: Diagnosis not present

## 2016-07-02 DIAGNOSIS — Z992 Dependence on renal dialysis: Secondary | ICD-10-CM | POA: Diagnosis not present

## 2016-07-02 DIAGNOSIS — E1122 Type 2 diabetes mellitus with diabetic chronic kidney disease: Secondary | ICD-10-CM | POA: Diagnosis not present

## 2016-07-02 DIAGNOSIS — N2581 Secondary hyperparathyroidism of renal origin: Secondary | ICD-10-CM | POA: Diagnosis not present

## 2016-07-02 DIAGNOSIS — Z23 Encounter for immunization: Secondary | ICD-10-CM | POA: Diagnosis not present

## 2016-07-02 LAB — PROTIME-INR: INR: 1.8 — AB (ref <=1.1)

## 2016-07-04 DIAGNOSIS — Z992 Dependence on renal dialysis: Secondary | ICD-10-CM | POA: Diagnosis not present

## 2016-07-04 DIAGNOSIS — Z23 Encounter for immunization: Secondary | ICD-10-CM | POA: Diagnosis not present

## 2016-07-04 DIAGNOSIS — D509 Iron deficiency anemia, unspecified: Secondary | ICD-10-CM | POA: Diagnosis not present

## 2016-07-04 DIAGNOSIS — E1122 Type 2 diabetes mellitus with diabetic chronic kidney disease: Secondary | ICD-10-CM | POA: Diagnosis not present

## 2016-07-04 DIAGNOSIS — N186 End stage renal disease: Secondary | ICD-10-CM | POA: Diagnosis not present

## 2016-07-04 DIAGNOSIS — N2581 Secondary hyperparathyroidism of renal origin: Secondary | ICD-10-CM | POA: Diagnosis not present

## 2016-07-07 ENCOUNTER — Ambulatory Visit: Payer: Self-pay | Admitting: General Practice

## 2016-07-07 DIAGNOSIS — D509 Iron deficiency anemia, unspecified: Secondary | ICD-10-CM | POA: Diagnosis not present

## 2016-07-07 DIAGNOSIS — N2581 Secondary hyperparathyroidism of renal origin: Secondary | ICD-10-CM | POA: Diagnosis not present

## 2016-07-07 DIAGNOSIS — E1122 Type 2 diabetes mellitus with diabetic chronic kidney disease: Secondary | ICD-10-CM | POA: Diagnosis not present

## 2016-07-07 DIAGNOSIS — N186 End stage renal disease: Secondary | ICD-10-CM | POA: Diagnosis not present

## 2016-07-07 DIAGNOSIS — Z23 Encounter for immunization: Secondary | ICD-10-CM | POA: Diagnosis not present

## 2016-07-07 DIAGNOSIS — Z992 Dependence on renal dialysis: Secondary | ICD-10-CM | POA: Diagnosis not present

## 2016-07-07 NOTE — Progress Notes (Signed)
I have reviewed and agree with the plan. 

## 2016-07-09 DIAGNOSIS — Z23 Encounter for immunization: Secondary | ICD-10-CM | POA: Diagnosis not present

## 2016-07-09 DIAGNOSIS — E1122 Type 2 diabetes mellitus with diabetic chronic kidney disease: Secondary | ICD-10-CM | POA: Diagnosis not present

## 2016-07-09 DIAGNOSIS — N186 End stage renal disease: Secondary | ICD-10-CM | POA: Diagnosis not present

## 2016-07-09 DIAGNOSIS — Z992 Dependence on renal dialysis: Secondary | ICD-10-CM | POA: Diagnosis not present

## 2016-07-09 DIAGNOSIS — M109 Gout, unspecified: Secondary | ICD-10-CM | POA: Diagnosis not present

## 2016-07-09 DIAGNOSIS — N2581 Secondary hyperparathyroidism of renal origin: Secondary | ICD-10-CM | POA: Diagnosis not present

## 2016-07-09 DIAGNOSIS — D509 Iron deficiency anemia, unspecified: Secondary | ICD-10-CM | POA: Diagnosis not present

## 2016-07-11 DIAGNOSIS — Z992 Dependence on renal dialysis: Secondary | ICD-10-CM | POA: Diagnosis not present

## 2016-07-11 DIAGNOSIS — D509 Iron deficiency anemia, unspecified: Secondary | ICD-10-CM | POA: Diagnosis not present

## 2016-07-11 DIAGNOSIS — E1122 Type 2 diabetes mellitus with diabetic chronic kidney disease: Secondary | ICD-10-CM | POA: Diagnosis not present

## 2016-07-11 DIAGNOSIS — N2581 Secondary hyperparathyroidism of renal origin: Secondary | ICD-10-CM | POA: Diagnosis not present

## 2016-07-11 DIAGNOSIS — N186 End stage renal disease: Secondary | ICD-10-CM | POA: Diagnosis not present

## 2016-07-11 DIAGNOSIS — Z23 Encounter for immunization: Secondary | ICD-10-CM | POA: Diagnosis not present

## 2016-07-14 ENCOUNTER — Other Ambulatory Visit: Payer: Self-pay | Admitting: Internal Medicine

## 2016-07-14 ENCOUNTER — Other Ambulatory Visit: Payer: Self-pay | Admitting: Endocrinology

## 2016-07-14 DIAGNOSIS — D631 Anemia in chronic kidney disease: Secondary | ICD-10-CM | POA: Diagnosis not present

## 2016-07-14 DIAGNOSIS — N2581 Secondary hyperparathyroidism of renal origin: Secondary | ICD-10-CM | POA: Diagnosis not present

## 2016-07-14 DIAGNOSIS — N186 End stage renal disease: Secondary | ICD-10-CM | POA: Diagnosis not present

## 2016-07-14 DIAGNOSIS — E1122 Type 2 diabetes mellitus with diabetic chronic kidney disease: Secondary | ICD-10-CM | POA: Diagnosis not present

## 2016-07-14 NOTE — Telephone Encounter (Signed)
Sent to pharmacy 

## 2016-07-16 DIAGNOSIS — I82492 Acute embolism and thrombosis of other specified deep vein of left lower extremity: Secondary | ICD-10-CM | POA: Diagnosis not present

## 2016-07-16 DIAGNOSIS — N186 End stage renal disease: Secondary | ICD-10-CM | POA: Diagnosis not present

## 2016-07-16 DIAGNOSIS — E1122 Type 2 diabetes mellitus with diabetic chronic kidney disease: Secondary | ICD-10-CM | POA: Diagnosis not present

## 2016-07-16 DIAGNOSIS — N2581 Secondary hyperparathyroidism of renal origin: Secondary | ICD-10-CM | POA: Diagnosis not present

## 2016-07-16 DIAGNOSIS — D631 Anemia in chronic kidney disease: Secondary | ICD-10-CM | POA: Diagnosis not present

## 2016-07-18 DIAGNOSIS — N2581 Secondary hyperparathyroidism of renal origin: Secondary | ICD-10-CM | POA: Diagnosis not present

## 2016-07-18 DIAGNOSIS — E1122 Type 2 diabetes mellitus with diabetic chronic kidney disease: Secondary | ICD-10-CM | POA: Diagnosis not present

## 2016-07-18 DIAGNOSIS — N186 End stage renal disease: Secondary | ICD-10-CM | POA: Diagnosis not present

## 2016-07-18 DIAGNOSIS — D631 Anemia in chronic kidney disease: Secondary | ICD-10-CM | POA: Diagnosis not present

## 2016-07-21 DIAGNOSIS — D631 Anemia in chronic kidney disease: Secondary | ICD-10-CM | POA: Diagnosis not present

## 2016-07-21 DIAGNOSIS — E1122 Type 2 diabetes mellitus with diabetic chronic kidney disease: Secondary | ICD-10-CM | POA: Diagnosis not present

## 2016-07-21 DIAGNOSIS — N2581 Secondary hyperparathyroidism of renal origin: Secondary | ICD-10-CM | POA: Diagnosis not present

## 2016-07-21 DIAGNOSIS — N186 End stage renal disease: Secondary | ICD-10-CM | POA: Diagnosis not present

## 2016-07-23 DIAGNOSIS — D631 Anemia in chronic kidney disease: Secondary | ICD-10-CM | POA: Diagnosis not present

## 2016-07-23 DIAGNOSIS — I82492 Acute embolism and thrombosis of other specified deep vein of left lower extremity: Secondary | ICD-10-CM | POA: Diagnosis not present

## 2016-07-23 DIAGNOSIS — N2581 Secondary hyperparathyroidism of renal origin: Secondary | ICD-10-CM | POA: Diagnosis not present

## 2016-07-23 DIAGNOSIS — N186 End stage renal disease: Secondary | ICD-10-CM | POA: Diagnosis not present

## 2016-07-23 DIAGNOSIS — E1122 Type 2 diabetes mellitus with diabetic chronic kidney disease: Secondary | ICD-10-CM | POA: Diagnosis not present

## 2016-07-23 LAB — PROTIME-INR: INR: 3.3 — AB (ref <=1.1)

## 2016-07-25 DIAGNOSIS — N2581 Secondary hyperparathyroidism of renal origin: Secondary | ICD-10-CM | POA: Diagnosis not present

## 2016-07-25 DIAGNOSIS — E1122 Type 2 diabetes mellitus with diabetic chronic kidney disease: Secondary | ICD-10-CM | POA: Diagnosis not present

## 2016-07-25 DIAGNOSIS — N186 End stage renal disease: Secondary | ICD-10-CM | POA: Diagnosis not present

## 2016-07-25 DIAGNOSIS — D631 Anemia in chronic kidney disease: Secondary | ICD-10-CM | POA: Diagnosis not present

## 2016-07-28 ENCOUNTER — Ambulatory Visit: Payer: Self-pay | Admitting: General Practice

## 2016-07-28 DIAGNOSIS — N2581 Secondary hyperparathyroidism of renal origin: Secondary | ICD-10-CM | POA: Diagnosis not present

## 2016-07-28 DIAGNOSIS — N186 End stage renal disease: Secondary | ICD-10-CM | POA: Diagnosis not present

## 2016-07-28 DIAGNOSIS — D631 Anemia in chronic kidney disease: Secondary | ICD-10-CM | POA: Diagnosis not present

## 2016-07-28 DIAGNOSIS — E1122 Type 2 diabetes mellitus with diabetic chronic kidney disease: Secondary | ICD-10-CM | POA: Diagnosis not present

## 2016-07-28 NOTE — Progress Notes (Signed)
I have reviewed and agree with the plan. 

## 2016-07-28 NOTE — Patient Instructions (Signed)
Pre visit review using our clinic review tool, if applicable. No additional management support is needed unless otherwise documented below in the visit note. 

## 2016-07-30 DIAGNOSIS — N2581 Secondary hyperparathyroidism of renal origin: Secondary | ICD-10-CM | POA: Diagnosis not present

## 2016-07-30 DIAGNOSIS — E1122 Type 2 diabetes mellitus with diabetic chronic kidney disease: Secondary | ICD-10-CM | POA: Diagnosis not present

## 2016-07-30 DIAGNOSIS — D631 Anemia in chronic kidney disease: Secondary | ICD-10-CM | POA: Diagnosis not present

## 2016-07-30 DIAGNOSIS — N186 End stage renal disease: Secondary | ICD-10-CM | POA: Diagnosis not present

## 2016-07-30 DIAGNOSIS — I82492 Acute embolism and thrombosis of other specified deep vein of left lower extremity: Secondary | ICD-10-CM | POA: Diagnosis not present

## 2016-08-01 DIAGNOSIS — D631 Anemia in chronic kidney disease: Secondary | ICD-10-CM | POA: Diagnosis not present

## 2016-08-01 DIAGNOSIS — N2581 Secondary hyperparathyroidism of renal origin: Secondary | ICD-10-CM | POA: Diagnosis not present

## 2016-08-01 DIAGNOSIS — N186 End stage renal disease: Secondary | ICD-10-CM | POA: Diagnosis not present

## 2016-08-01 DIAGNOSIS — E1122 Type 2 diabetes mellitus with diabetic chronic kidney disease: Secondary | ICD-10-CM | POA: Diagnosis not present

## 2016-08-04 DIAGNOSIS — E1122 Type 2 diabetes mellitus with diabetic chronic kidney disease: Secondary | ICD-10-CM | POA: Diagnosis not present

## 2016-08-04 DIAGNOSIS — N186 End stage renal disease: Secondary | ICD-10-CM | POA: Diagnosis not present

## 2016-08-04 DIAGNOSIS — D631 Anemia in chronic kidney disease: Secondary | ICD-10-CM | POA: Diagnosis not present

## 2016-08-04 DIAGNOSIS — N2581 Secondary hyperparathyroidism of renal origin: Secondary | ICD-10-CM | POA: Diagnosis not present

## 2016-08-06 DIAGNOSIS — N186 End stage renal disease: Secondary | ICD-10-CM | POA: Diagnosis not present

## 2016-08-06 DIAGNOSIS — M109 Gout, unspecified: Secondary | ICD-10-CM | POA: Diagnosis not present

## 2016-08-06 DIAGNOSIS — N2581 Secondary hyperparathyroidism of renal origin: Secondary | ICD-10-CM | POA: Diagnosis not present

## 2016-08-06 DIAGNOSIS — E1122 Type 2 diabetes mellitus with diabetic chronic kidney disease: Secondary | ICD-10-CM | POA: Diagnosis not present

## 2016-08-06 DIAGNOSIS — D631 Anemia in chronic kidney disease: Secondary | ICD-10-CM | POA: Diagnosis not present

## 2016-08-06 DIAGNOSIS — E1129 Type 2 diabetes mellitus with other diabetic kidney complication: Secondary | ICD-10-CM | POA: Diagnosis not present

## 2016-08-06 LAB — PROTIME-INR: INR: 2.3 — AB (ref <=1.1)

## 2016-08-08 DIAGNOSIS — D631 Anemia in chronic kidney disease: Secondary | ICD-10-CM | POA: Diagnosis not present

## 2016-08-08 DIAGNOSIS — N186 End stage renal disease: Secondary | ICD-10-CM | POA: Diagnosis not present

## 2016-08-08 DIAGNOSIS — N2581 Secondary hyperparathyroidism of renal origin: Secondary | ICD-10-CM | POA: Diagnosis not present

## 2016-08-08 DIAGNOSIS — E1122 Type 2 diabetes mellitus with diabetic chronic kidney disease: Secondary | ICD-10-CM | POA: Diagnosis not present

## 2016-08-11 ENCOUNTER — Ambulatory Visit (INDEPENDENT_AMBULATORY_CARE_PROVIDER_SITE_OTHER): Payer: Self-pay | Admitting: General Practice

## 2016-08-11 DIAGNOSIS — N2581 Secondary hyperparathyroidism of renal origin: Secondary | ICD-10-CM | POA: Diagnosis not present

## 2016-08-11 DIAGNOSIS — D631 Anemia in chronic kidney disease: Secondary | ICD-10-CM | POA: Diagnosis not present

## 2016-08-11 DIAGNOSIS — N186 End stage renal disease: Secondary | ICD-10-CM | POA: Diagnosis not present

## 2016-08-11 DIAGNOSIS — I82502 Chronic embolism and thrombosis of unspecified deep veins of left lower extremity: Secondary | ICD-10-CM

## 2016-08-11 DIAGNOSIS — Z992 Dependence on renal dialysis: Secondary | ICD-10-CM | POA: Diagnosis not present

## 2016-08-11 DIAGNOSIS — Z5181 Encounter for therapeutic drug level monitoring: Secondary | ICD-10-CM

## 2016-08-11 DIAGNOSIS — E1122 Type 2 diabetes mellitus with diabetic chronic kidney disease: Secondary | ICD-10-CM | POA: Diagnosis not present

## 2016-08-11 NOTE — Progress Notes (Signed)
I have reviewed and agree with the plan. 

## 2016-08-11 NOTE — Patient Instructions (Signed)
Pre visit review using our clinic review tool, if applicable. No additional management support is needed unless otherwise documented below in the visit note. 

## 2016-08-13 DIAGNOSIS — E1122 Type 2 diabetes mellitus with diabetic chronic kidney disease: Secondary | ICD-10-CM | POA: Diagnosis not present

## 2016-08-13 DIAGNOSIS — I82492 Acute embolism and thrombosis of other specified deep vein of left lower extremity: Secondary | ICD-10-CM | POA: Diagnosis not present

## 2016-08-13 DIAGNOSIS — Z992 Dependence on renal dialysis: Secondary | ICD-10-CM | POA: Diagnosis not present

## 2016-08-13 DIAGNOSIS — N2581 Secondary hyperparathyroidism of renal origin: Secondary | ICD-10-CM | POA: Diagnosis not present

## 2016-08-13 DIAGNOSIS — E1129 Type 2 diabetes mellitus with other diabetic kidney complication: Secondary | ICD-10-CM | POA: Diagnosis not present

## 2016-08-13 DIAGNOSIS — N186 End stage renal disease: Secondary | ICD-10-CM | POA: Diagnosis not present

## 2016-08-13 LAB — PROTIME-INR: INR: 2.5 — AB (ref <=1.1)

## 2016-08-15 DIAGNOSIS — Z992 Dependence on renal dialysis: Secondary | ICD-10-CM | POA: Diagnosis not present

## 2016-08-15 DIAGNOSIS — E1122 Type 2 diabetes mellitus with diabetic chronic kidney disease: Secondary | ICD-10-CM | POA: Diagnosis not present

## 2016-08-15 DIAGNOSIS — E1129 Type 2 diabetes mellitus with other diabetic kidney complication: Secondary | ICD-10-CM | POA: Diagnosis not present

## 2016-08-15 DIAGNOSIS — N186 End stage renal disease: Secondary | ICD-10-CM | POA: Diagnosis not present

## 2016-08-15 DIAGNOSIS — N2581 Secondary hyperparathyroidism of renal origin: Secondary | ICD-10-CM | POA: Diagnosis not present

## 2016-08-18 ENCOUNTER — Ambulatory Visit (INDEPENDENT_AMBULATORY_CARE_PROVIDER_SITE_OTHER): Payer: Medicare Other | Admitting: General Practice

## 2016-08-18 DIAGNOSIS — E1122 Type 2 diabetes mellitus with diabetic chronic kidney disease: Secondary | ICD-10-CM | POA: Diagnosis not present

## 2016-08-18 DIAGNOSIS — Z992 Dependence on renal dialysis: Secondary | ICD-10-CM | POA: Diagnosis not present

## 2016-08-18 DIAGNOSIS — N186 End stage renal disease: Secondary | ICD-10-CM | POA: Diagnosis not present

## 2016-08-18 DIAGNOSIS — E1129 Type 2 diabetes mellitus with other diabetic kidney complication: Secondary | ICD-10-CM | POA: Diagnosis not present

## 2016-08-18 DIAGNOSIS — N2581 Secondary hyperparathyroidism of renal origin: Secondary | ICD-10-CM | POA: Diagnosis not present

## 2016-08-18 NOTE — Patient Instructions (Signed)
Pre visit review using our clinic review tool, if applicable. No additional management support is needed unless otherwise documented below in the visit note. 

## 2016-08-20 DIAGNOSIS — I82492 Acute embolism and thrombosis of other specified deep vein of left lower extremity: Secondary | ICD-10-CM | POA: Diagnosis not present

## 2016-08-20 DIAGNOSIS — N2581 Secondary hyperparathyroidism of renal origin: Secondary | ICD-10-CM | POA: Diagnosis not present

## 2016-08-20 DIAGNOSIS — Z992 Dependence on renal dialysis: Secondary | ICD-10-CM | POA: Diagnosis not present

## 2016-08-20 DIAGNOSIS — E1129 Type 2 diabetes mellitus with other diabetic kidney complication: Secondary | ICD-10-CM | POA: Diagnosis not present

## 2016-08-20 DIAGNOSIS — N186 End stage renal disease: Secondary | ICD-10-CM | POA: Diagnosis not present

## 2016-08-20 DIAGNOSIS — E1122 Type 2 diabetes mellitus with diabetic chronic kidney disease: Secondary | ICD-10-CM | POA: Diagnosis not present

## 2016-08-20 LAB — PROTIME-INR: INR: 2.5 — AB (ref <=1.1)

## 2016-08-22 DIAGNOSIS — N186 End stage renal disease: Secondary | ICD-10-CM | POA: Diagnosis not present

## 2016-08-22 DIAGNOSIS — E1129 Type 2 diabetes mellitus with other diabetic kidney complication: Secondary | ICD-10-CM | POA: Diagnosis not present

## 2016-08-22 DIAGNOSIS — E1122 Type 2 diabetes mellitus with diabetic chronic kidney disease: Secondary | ICD-10-CM | POA: Diagnosis not present

## 2016-08-22 DIAGNOSIS — N2581 Secondary hyperparathyroidism of renal origin: Secondary | ICD-10-CM | POA: Diagnosis not present

## 2016-08-22 DIAGNOSIS — Z992 Dependence on renal dialysis: Secondary | ICD-10-CM | POA: Diagnosis not present

## 2016-08-25 DIAGNOSIS — Z992 Dependence on renal dialysis: Secondary | ICD-10-CM | POA: Diagnosis not present

## 2016-08-25 DIAGNOSIS — N2581 Secondary hyperparathyroidism of renal origin: Secondary | ICD-10-CM | POA: Diagnosis not present

## 2016-08-25 DIAGNOSIS — E1122 Type 2 diabetes mellitus with diabetic chronic kidney disease: Secondary | ICD-10-CM | POA: Diagnosis not present

## 2016-08-25 DIAGNOSIS — N186 End stage renal disease: Secondary | ICD-10-CM | POA: Diagnosis not present

## 2016-08-25 DIAGNOSIS — E1129 Type 2 diabetes mellitus with other diabetic kidney complication: Secondary | ICD-10-CM | POA: Diagnosis not present

## 2016-08-26 ENCOUNTER — Ambulatory Visit (INDEPENDENT_AMBULATORY_CARE_PROVIDER_SITE_OTHER): Payer: Medicare Other | Admitting: General Practice

## 2016-08-26 ENCOUNTER — Other Ambulatory Visit: Payer: Self-pay | Admitting: Internal Medicine

## 2016-08-26 ENCOUNTER — Other Ambulatory Visit: Payer: Self-pay | Admitting: Endocrinology

## 2016-08-26 NOTE — Patient Instructions (Signed)
Pre visit review using our clinic review tool, if applicable. No additional management support is needed unless otherwise documented below in the visit note. 

## 2016-08-26 NOTE — Progress Notes (Signed)
I have reviewed and agree with the plan. 

## 2016-08-27 DIAGNOSIS — Z992 Dependence on renal dialysis: Secondary | ICD-10-CM | POA: Diagnosis not present

## 2016-08-27 DIAGNOSIS — E1129 Type 2 diabetes mellitus with other diabetic kidney complication: Secondary | ICD-10-CM | POA: Diagnosis not present

## 2016-08-27 DIAGNOSIS — E1122 Type 2 diabetes mellitus with diabetic chronic kidney disease: Secondary | ICD-10-CM | POA: Diagnosis not present

## 2016-08-27 DIAGNOSIS — N2581 Secondary hyperparathyroidism of renal origin: Secondary | ICD-10-CM | POA: Diagnosis not present

## 2016-08-27 DIAGNOSIS — I82492 Acute embolism and thrombosis of other specified deep vein of left lower extremity: Secondary | ICD-10-CM | POA: Diagnosis not present

## 2016-08-27 DIAGNOSIS — N186 End stage renal disease: Secondary | ICD-10-CM | POA: Diagnosis not present

## 2016-08-28 ENCOUNTER — Other Ambulatory Visit (INDEPENDENT_AMBULATORY_CARE_PROVIDER_SITE_OTHER): Payer: Medicare Other

## 2016-08-28 ENCOUNTER — Other Ambulatory Visit: Payer: Self-pay | Admitting: Endocrinology

## 2016-08-28 DIAGNOSIS — E1121 Type 2 diabetes mellitus with diabetic nephropathy: Secondary | ICD-10-CM

## 2016-08-28 DIAGNOSIS — Z794 Long term (current) use of insulin: Secondary | ICD-10-CM

## 2016-08-28 LAB — HEMOGLOBIN A1C: Hgb A1c MFr Bld: 7.1 % — ABNORMAL HIGH (ref 4.6–6.5)

## 2016-08-28 LAB — LIPID PANEL
Cholesterol: 124 mg/dL (ref 0–200)
HDL: 31.8 mg/dL — ABNORMAL LOW (ref >39.00)
LDL Cholesterol: 67 mg/dL (ref 0–99)
NonHDL: 91.96
Total CHOL/HDL Ratio: 4
Triglycerides: 127 mg/dL (ref 0.0–149.0)
VLDL: 25.4 mg/dL (ref 0.0–40.0)

## 2016-08-28 LAB — GLUCOSE, RANDOM: Glucose, Bld: 275 mg/dL — ABNORMAL HIGH (ref 70–99)

## 2016-08-29 DIAGNOSIS — N186 End stage renal disease: Secondary | ICD-10-CM | POA: Diagnosis not present

## 2016-08-29 DIAGNOSIS — N2581 Secondary hyperparathyroidism of renal origin: Secondary | ICD-10-CM | POA: Diagnosis not present

## 2016-08-29 DIAGNOSIS — E1122 Type 2 diabetes mellitus with diabetic chronic kidney disease: Secondary | ICD-10-CM | POA: Diagnosis not present

## 2016-08-29 DIAGNOSIS — E1129 Type 2 diabetes mellitus with other diabetic kidney complication: Secondary | ICD-10-CM | POA: Diagnosis not present

## 2016-08-29 DIAGNOSIS — Z992 Dependence on renal dialysis: Secondary | ICD-10-CM | POA: Diagnosis not present

## 2016-08-31 DIAGNOSIS — N2581 Secondary hyperparathyroidism of renal origin: Secondary | ICD-10-CM | POA: Diagnosis not present

## 2016-08-31 DIAGNOSIS — N186 End stage renal disease: Secondary | ICD-10-CM | POA: Diagnosis not present

## 2016-08-31 DIAGNOSIS — E1129 Type 2 diabetes mellitus with other diabetic kidney complication: Secondary | ICD-10-CM | POA: Diagnosis not present

## 2016-08-31 DIAGNOSIS — E1122 Type 2 diabetes mellitus with diabetic chronic kidney disease: Secondary | ICD-10-CM | POA: Diagnosis not present

## 2016-08-31 DIAGNOSIS — Z992 Dependence on renal dialysis: Secondary | ICD-10-CM | POA: Diagnosis not present

## 2016-09-02 ENCOUNTER — Ambulatory Visit (INDEPENDENT_AMBULATORY_CARE_PROVIDER_SITE_OTHER): Payer: Medicare Other | Admitting: Endocrinology

## 2016-09-02 ENCOUNTER — Encounter: Payer: Self-pay | Admitting: Endocrinology

## 2016-09-02 VITALS — BP 130/68 | HR 96 | Ht 72.0 in | Wt 262.4 lb

## 2016-09-02 DIAGNOSIS — E1165 Type 2 diabetes mellitus with hyperglycemia: Secondary | ICD-10-CM

## 2016-09-02 DIAGNOSIS — M109 Gout, unspecified: Secondary | ICD-10-CM | POA: Diagnosis not present

## 2016-09-02 DIAGNOSIS — N2581 Secondary hyperparathyroidism of renal origin: Secondary | ICD-10-CM | POA: Diagnosis not present

## 2016-09-02 DIAGNOSIS — Z794 Long term (current) use of insulin: Secondary | ICD-10-CM

## 2016-09-02 DIAGNOSIS — I82492 Acute embolism and thrombosis of other specified deep vein of left lower extremity: Secondary | ICD-10-CM | POA: Diagnosis not present

## 2016-09-02 DIAGNOSIS — N186 End stage renal disease: Secondary | ICD-10-CM | POA: Diagnosis not present

## 2016-09-02 DIAGNOSIS — E1122 Type 2 diabetes mellitus with diabetic chronic kidney disease: Secondary | ICD-10-CM | POA: Diagnosis not present

## 2016-09-02 DIAGNOSIS — E1129 Type 2 diabetes mellitus with other diabetic kidney complication: Secondary | ICD-10-CM | POA: Diagnosis not present

## 2016-09-02 DIAGNOSIS — E7800 Pure hypercholesterolemia, unspecified: Secondary | ICD-10-CM

## 2016-09-02 DIAGNOSIS — Z992 Dependence on renal dialysis: Secondary | ICD-10-CM | POA: Diagnosis not present

## 2016-09-02 MED ORDER — GLUCOSE BLOOD VI STRP: ORAL_STRIP | 12 refills | Status: DC

## 2016-09-02 MED ORDER — ACCU-CHEK MULTICLIX LANCETS MISC: 12 refills | Status: DC

## 2016-09-02 NOTE — Progress Notes (Signed)
Patient ID: [name] [name], male   DOB: Aug 04, 1966, 50 y.o.   MRN: [id number]           Reason for Appointment: Follow-up for Type 2 Diabetes  History of Present Illness:          Date of diagnosis of type 2 diabetes mellitus : 1990       Background history:  He has been taking insulin since about 1999, previously was on unknown oral agents He had good control with insulin initially but A1c was much higher in 2008 and subsequently has had required larger doses of insulin He has been on Lantus for the last few years along with Novolog,followed by an endocrinologist since 2008 His A1c was 6.3 in 04/2014   Prior to his consultation he was on a regimen of Novolog , 50 units 5 times a day with each meal and snack along with 90 Units 3 times a day of Lantus   Recent history:   INSULIN regimen is :U-500 insulin with KwikPen 20 before bfst, 0-20 before lunch and 25 before dinner, take 30 min before   Sliding scale use for high sugars: Sugar 150-199 take 2 units, 200-250= 4 units and over 250 take 6 units  TOUJEO 90 units twice daily  He has not been seen in follow-up for several months  A1c is fairly good at 7.1, previously has been as high as 7.7  Currently not taking prednisone  Current blood sugar patterns and problems identified:  He did not bring his monitor today, he says his insurance does not cover his Contour test strips  He has not checked his blood sugar regularly but using meter belonging to a friend.  He thinks his blood sugars are much higher during the day but fasting readings are normal  He is mostly eating 2 meals a day now  He states he is taking his insulin for the meals this before or 10 minutes before eating  However lab glucose was high after breakfast at 275 and he usually does not get much protein in the morning  He is compliant with his Toujeo twice a day.  No overnight hypoglycemia  Compliance with the medical regimen: usually good Hypoglycemia: As  above  Glucose monitoring:  done 0-2 times a day         Glucometer: Previously using  Aviva      Blood Glucose readings by recall:  Mean values apply above for all meters except median for One Touch  PRE-MEAL Fasting Lunch Dinner Bedtime Overall  Glucose range: 97-140  200    Mean/median:          Self-care: The diet that the patient has been following is: tries to limit high-fat meals, usually eating  2-3 meals a day.     Typical meal intake: Breakfast is grits with pecans, olive oil. Dinner 6 pm               Dietician visit, most recent:2013               Exercise:  unable to do any  Weight history:  Wt Readings from Last 3 Encounters:  09/02/16 262 lb 6.4 oz (119 kg)  05/11/16 245 lb (111.1 kg)  02/20/16 246 lb 14.6 oz (112 kg)    Glycemic control:   Lab Results  Component Value Date   HGBA1C 7.1 (H) 08/28/2016   HGBA1C 7.2 (H) 11/22/2015   HGBA1C 7.7 (H) 10/17/2015   Lab Results  Component Value  Date   MICROALBUR 232.9 (H) 06/19/2015   LDLCALC 67 08/28/2016   CREATININE 9.35 (HH) 05/11/2016         Medication List       Accurate as of 09/02/16  5:04 PM. Always use your most recent med list.          accu-chek multiclix lancets Check blood sugar four times daily   calcium acetate 667 MG capsule Commonly known as:  PHOSLO Take 3 capsules (2,001 mg total) by mouth 3 (three) times daily with meals.   clonazePAM 0.5 MG tablet Commonly known as:  KLONOPIN TAKE 2 TABLETS AT BEDTIME   cloNIDine 0.3 MG tablet Commonly known as:  CATAPRES TAKE 1 TABLET THREE TIMES DAILY   Febuxostat 80 MG Tabs Commonly known as:  ULORIC Take 1 tablet (80 mg total) by mouth daily.   glucose blood test strip Commonly known as:  ACCU-CHEK GUIDE Check blood sugar four times daily   HUMULIN R U-500 KWIKPEN 500 UNIT/ML kwikpen Generic drug:  insulin regular human CONCENTRATED INJECT 15 UNITS AT BREAKFAST, 25 UNITS AT LUNCH AND 20 UNITS BEFORE SUPPER (APPOINTMENT  NEEDED FOR FURTHER REFILLS)   insulin aspart 100 UNIT/ML FlexPen Commonly known as:  NOVOLOG FLEXPEN Inject 6 Units into the skin 3 (three) times daily with meals.   Insulin Pen Needle 32G X 8 MM Misc Use as directed   BD PEN NEEDLE NANO U/F 32G X 4 MM Misc Generic drug:  Insulin Pen Needle USE  5  PER  DAY AS DIRECTED   labetalol 100 MG tablet Commonly known as:  NORMODYNE TAKE 1 TABLET THREE TIMES DAILY   omeprazole 20 MG capsule Commonly known as:  PRILOSEC Take 1 capsule (20 mg total) by mouth daily.   rosuvastatin 20 MG tablet Commonly known as:  CRESTOR Take 1 tablet (20 mg total) by mouth at bedtime.   tadalafil 5 MG tablet Commonly known as:  CIALIS Take 1 tablet (5 mg total) by mouth daily.   TOUJEO SOLOSTAR 300 UNIT/ML Sopn Generic drug:  Insulin Glargine INJECT  90 UNITS SUBCUTANEOUSLY TWICE DAILY   traMADol 50 MG tablet Commonly known as:  ULTRAM Take 1 tablet (50 mg total) by mouth every 8 (eight) hours as needed.   warfarin 5 MG tablet Commonly known as:  COUMADIN Take 1 (5 mg) tablet daily.       Allergies:  Allergies  Allergen Reactions  . Pork-Derived Products Anaphylaxis and Other (See Comments)    Fever also   . Ace Inhibitors Cough  . Hydrocodone Other (See Comments)    Hallucinations    Past Medical History:  Diagnosis Date  . Allergic rhinitis   . Anemia   . Aortic insufficiency    a. Echo 7/16:  Mod LVH, EF 50-55%, mild to mod AI, severe LAE, PASP 57 mmHg (LV ID end diastolic 78.9 mm);  b. Echo 2/17:  Mod LVH, EF 50-55%, mod AI, MAC, mild MR, mod LAE, mild to mod TR, PASP 63 mmHg  . Bilateral carpal tunnel syndrome   . CHF (congestive heart failure) ([place])   . Chronic kidney disease    Kidney Transplant 2009 09/26/15- transplanated kidney failing  . Chronic kidney disease (CKD), stage IV (severe) ([place])   . Chronic renal allograft nephropathy   . Claustrophobia   . Complication of anesthesia    "they said something at [facility] it was affected by sleep aqpnea."  . COPD (chronic obstructive pulmonary disease) ([place])   .  Diabetes mellitus    Type II  . Diabetic peripheral neuropathy ([place])   . Diabetic retinopathy ([place])   . Diminished eyesight   . Discitis of lumbar region    resolved  . DVT (deep venous thrombosis) ([place]) 10/07/2015   LLE  . Dyslipidemia   . Endocarditis   . Endocarditis 11/28/14  . Gait disorder   . Gastroparesis   . GERD (gastroesophageal reflux disease)   . Gout   . Hearing difficulty   . Hyperlipidemia   . Hypertension   . Incomplete bladder emptying   . Leg pain 02/05/11   with walking  . Morbid obesity ([place])   . Nervousness(799.21)   . Obstructive sleep apnea    not using CPAP- lost 100lbs  . Osteomyelitis ([place])   . Posttraumatic stress disorder   . Renal transplant, status post 04/05/2014  . Rotator cuff disorder   . Secondary hyperparathyroidism ([place])   . Sepsis ([place])    Postive blood cultures -   . Shortness of breath    when lying flat  . Urethral stricture   . Vitamin D deficiency   . Weight gain     Past Surgical History:  Procedure Laterality Date  . ARTERIOVENOUS GRAFT PLACEMENT Bilateral "several"  . AV FISTULA PLACEMENT Bilateral 2015  . BASCILIC VEIN TRANSPOSITION Right 09/27/2015  . [place] Right 09/27/2015   Procedure: BASILIC VEIN TRANSPOSITION right;  Surgeon: Mal Misty, MD;  Location: [facility];  Service: Vascular;  Laterality: Right;  . CATARACT EXTRACTION W/ INTRAOCULAR LENS  IMPLANT, BILATERAL Bilateral   . CYSTOSCOPY W/ INTERNAL URETHROTOMY  09/2014  . KIDNEY TRANSPLANT  2009    Family History  Problem Relation Age of Onset  . Hypertension Mother   . Diabetes Father     Social History:  reports that he has never smoked. He has never used smokeless tobacco. He reports that he does not drink alcohol or use drugs.    Review of Systems    ROS    Lipid history: He is on 20 mg Crestor With the following results      Lab Results  Component Value Date   CHOL 124 08/28/2016   HDL 31.80 (L) 08/28/2016   LDLCALC 67 08/28/2016   TRIG 127.0 08/28/2016   CHOLHDL 4 08/28/2016            Eyes: Most recent eye exam was 6/15  Hypertension: His blood pressure is  controlled with 0.3 mg dose of clonidine along with labetalol and is followed by nephrologist   RENAL: He has had a transplant in 2009 and  he is on dialysis  Neurological: Has has had some decreased sensation in his feet, no burning, also sharp pains in his feet and toes, no tingling He has been given Lyrica by his neurologist and takes 50 mg at night   He has had marked decrease in his balance and also some weakness in his legs, using a power scooter  Last foot exam was in 03/2015: Absent monofilament sensation in his feet and toes and extending up to the middle of the lower leg on the left and up to the ankle on the right  LABS:  Lab on 08/28/2016  Component Date Value Ref Range Status  . Hgb A1c MFr Bld 08/28/2016 7.1* 4.6 - 6.5 % Final  . Cholesterol 08/28/2016 124  0 - 200 mg/dL Final  . Triglycerides 08/28/2016 127.0  0.0 - 149.0 mg/dL Final  . HDL 08/28/2016 31.80* >  39.00 mg/dL Final  . VLDL 08/28/2016 25.4  0.0 - 40.0 mg/dL Final  . LDL Cholesterol 08/28/2016 67  0 - 99 mg/dL Final  . Total CHOL/HDL Ratio 08/28/2016 4   Final  . NonHDL 08/28/2016 91.96   Final  . Glucose, Bld 08/28/2016 275* 70 - 99 mg/dL Final    Physical Examination:  BP 130/68   Pulse 96   Ht 6' (1.829 m)   Wt 262 lb 6.4 oz (119 kg)   SpO2 95%   BMI 35.59 kg/m       ASSESSMENT:  Diabetes type 2, long-standing with obesity See history of present illness for detailed discussion of his current management, blood sugar patterns and problems identified.  He reports high postprandial blood sugars but has been monitoring very sporadically Appears to be needing significantly more mealtime insulin compared to before and also his insulin timing is  inappropriate for the U-500 insulin Discussed meal planning with adding protein to each meal especially carbohydrate Discussed timing of the U-500 insulin Although A1c is adequate.  This likely to be abnormal because of his renal function He does need any monitor and reminded him that he needs to be more regular with his follow-up  HYPERLIPIDEMIA: Inadequately controlled  PLAN:   We'll increase his U-500 insulin to 35 units before breakfast and evening meal, may take less if he is eating lunch also  Start trying to take this 30 minutes before eating  Reduce the Toujeo insulin down to 80 units in the morning and increase the dose to 100 in the morning to cover daytime hyperglycemia especially since he is not eating or midday meal and going for dialysis  Start using the new Accu-Chek guide meter, he wants to check it as much as 4 times a day and this was encouraged, discussed benefits of this meter also  Bring monitor on the next visit for review of blood sugar patterns, discussed blood sugar targets at various times  Consider follow-up consultation with dietitian for review of meter planning  Needs consistent postprandial monitoring and discussed targets  Patient Instructions  Take Humulin 30 min before eating  Take 35 units before each meal  MORE protein at [place] AM AND 80 AT NITE  Check blood sugars on waking up  4X PER WEEK   Also check blood sugars about 2 hours after a meal and do this after different meals by rotation  Recommended blood sugar levels on waking up is 90-130 and about 2 hours after meal is 130-160  Please bring your blood sugar monitor to each visit, thank you     Counseling time on subjects discussed above is over 50% of today's 25 minute visit  Yenesis Even 09/02/2016, 5:04 PM   Note: This office note was prepared with Dragon voice recognition system technology. Any transcriptional errors that result from this process are  unintentional.

## 2016-09-02 NOTE — Patient Instructions (Addendum)
Take Humulin 30 min before eating  Take 35 units before each meal  MORE protein at Bfst  TOUJEO 100 UNITS IN AM AND 80 AT NITE  Check blood sugars on waking up  4X PER WEEK   Also check blood sugars about 2 hours after a meal and do this after different meals by rotation  Recommended blood sugar levels on waking up is 90-130 and about 2 hours after meal is 130-160  Please bring your blood sugar monitor to each visit, thank you

## 2016-09-05 DIAGNOSIS — E1129 Type 2 diabetes mellitus with other diabetic kidney complication: Secondary | ICD-10-CM | POA: Diagnosis not present

## 2016-09-05 DIAGNOSIS — Z992 Dependence on renal dialysis: Secondary | ICD-10-CM | POA: Diagnosis not present

## 2016-09-05 DIAGNOSIS — N2581 Secondary hyperparathyroidism of renal origin: Secondary | ICD-10-CM | POA: Diagnosis not present

## 2016-09-05 DIAGNOSIS — E1122 Type 2 diabetes mellitus with diabetic chronic kidney disease: Secondary | ICD-10-CM | POA: Diagnosis not present

## 2016-09-05 DIAGNOSIS — N186 End stage renal disease: Secondary | ICD-10-CM | POA: Diagnosis not present

## 2016-09-07 ENCOUNTER — Telehealth: Payer: Self-pay | Admitting: Endocrinology

## 2016-09-07 ENCOUNTER — Other Ambulatory Visit: Payer: Self-pay | Admitting: Internal Medicine

## 2016-09-07 NOTE — Telephone Encounter (Signed)
Pt needs [place] to call into [facility] for the strips and lancets please resend in the rx they are saying they didn't get the rx.

## 2016-09-08 ENCOUNTER — Other Ambulatory Visit: Payer: Self-pay | Admitting: General Practice

## 2016-09-08 DIAGNOSIS — E1129 Type 2 diabetes mellitus with other diabetic kidney complication: Secondary | ICD-10-CM | POA: Diagnosis not present

## 2016-09-08 DIAGNOSIS — N186 End stage renal disease: Secondary | ICD-10-CM | POA: Diagnosis not present

## 2016-09-08 DIAGNOSIS — E1122 Type 2 diabetes mellitus with diabetic chronic kidney disease: Secondary | ICD-10-CM | POA: Diagnosis not present

## 2016-09-08 DIAGNOSIS — N2581 Secondary hyperparathyroidism of renal origin: Secondary | ICD-10-CM | POA: Diagnosis not present

## 2016-09-08 DIAGNOSIS — Z992 Dependence on renal dialysis: Secondary | ICD-10-CM | POA: Diagnosis not present

## 2016-09-08 MED ORDER — WARFARIN SODIUM 5 MG PO TABS: ORAL_TABLET | ORAL | 1 refills | Status: DC

## 2016-09-08 NOTE — Telephone Encounter (Signed)
Please advise on the warfarin refill, thanks.

## 2016-09-09 NOTE — Telephone Encounter (Signed)
rx for the strips and the lancets needs PA? Pt seems confused please call the pharmacy

## 2016-09-10 DIAGNOSIS — E1129 Type 2 diabetes mellitus with other diabetic kidney complication: Secondary | ICD-10-CM | POA: Diagnosis not present

## 2016-09-10 DIAGNOSIS — N2581 Secondary hyperparathyroidism of renal origin: Secondary | ICD-10-CM | POA: Diagnosis not present

## 2016-09-10 DIAGNOSIS — I82492 Acute embolism and thrombosis of other specified deep vein of left lower extremity: Secondary | ICD-10-CM | POA: Diagnosis not present

## 2016-09-10 DIAGNOSIS — N186 End stage renal disease: Secondary | ICD-10-CM | POA: Diagnosis not present

## 2016-09-10 DIAGNOSIS — E1122 Type 2 diabetes mellitus with diabetic chronic kidney disease: Secondary | ICD-10-CM | POA: Diagnosis not present

## 2016-09-10 DIAGNOSIS — Z992 Dependence on renal dialysis: Secondary | ICD-10-CM | POA: Diagnosis not present

## 2016-09-10 LAB — PROTIME-INR: INR: 2 — AB (ref <=1.1)

## 2016-09-11 ENCOUNTER — Other Ambulatory Visit: Payer: Self-pay

## 2016-09-11 NOTE — Telephone Encounter (Signed)
Spoke with [facility] and the patient can not got test strips and lancets through them he has to go to local pharmacy and he stated an understanding

## 2016-09-12 DIAGNOSIS — N186 End stage renal disease: Secondary | ICD-10-CM | POA: Diagnosis not present

## 2016-09-12 DIAGNOSIS — Z992 Dependence on renal dialysis: Secondary | ICD-10-CM | POA: Diagnosis not present

## 2016-09-12 DIAGNOSIS — E8770 Fluid overload, unspecified: Secondary | ICD-10-CM | POA: Diagnosis not present

## 2016-09-12 DIAGNOSIS — N2581 Secondary hyperparathyroidism of renal origin: Secondary | ICD-10-CM | POA: Diagnosis not present

## 2016-09-12 DIAGNOSIS — E1122 Type 2 diabetes mellitus with diabetic chronic kidney disease: Secondary | ICD-10-CM | POA: Diagnosis not present

## 2016-09-12 DIAGNOSIS — E1129 Type 2 diabetes mellitus with other diabetic kidney complication: Secondary | ICD-10-CM | POA: Diagnosis not present

## 2016-09-15 ENCOUNTER — Ambulatory Visit (INDEPENDENT_AMBULATORY_CARE_PROVIDER_SITE_OTHER): Payer: Medicare Other | Admitting: General Practice

## 2016-09-15 DIAGNOSIS — N2581 Secondary hyperparathyroidism of renal origin: Secondary | ICD-10-CM | POA: Diagnosis not present

## 2016-09-15 DIAGNOSIS — N186 End stage renal disease: Secondary | ICD-10-CM | POA: Diagnosis not present

## 2016-09-15 DIAGNOSIS — Z992 Dependence on renal dialysis: Secondary | ICD-10-CM | POA: Diagnosis not present

## 2016-09-15 DIAGNOSIS — E1122 Type 2 diabetes mellitus with diabetic chronic kidney disease: Secondary | ICD-10-CM | POA: Diagnosis not present

## 2016-09-15 DIAGNOSIS — E1129 Type 2 diabetes mellitus with other diabetic kidney complication: Secondary | ICD-10-CM | POA: Diagnosis not present

## 2016-09-15 NOTE — Progress Notes (Signed)
I have reviewed and agree with the plan. 

## 2016-09-15 NOTE — Patient Instructions (Signed)
Pre visit review using our clinic review tool, if applicable. No additional management support is needed unless otherwise documented below in the visit note. 

## 2016-09-17 DIAGNOSIS — E1129 Type 2 diabetes mellitus with other diabetic kidney complication: Secondary | ICD-10-CM | POA: Diagnosis not present

## 2016-09-17 DIAGNOSIS — N2581 Secondary hyperparathyroidism of renal origin: Secondary | ICD-10-CM | POA: Diagnosis not present

## 2016-09-17 DIAGNOSIS — E1122 Type 2 diabetes mellitus with diabetic chronic kidney disease: Secondary | ICD-10-CM | POA: Diagnosis not present

## 2016-09-17 DIAGNOSIS — Z992 Dependence on renal dialysis: Secondary | ICD-10-CM | POA: Diagnosis not present

## 2016-09-17 DIAGNOSIS — N186 End stage renal disease: Secondary | ICD-10-CM | POA: Diagnosis not present

## 2016-09-17 DIAGNOSIS — I82492 Acute embolism and thrombosis of other specified deep vein of left lower extremity: Secondary | ICD-10-CM | POA: Diagnosis not present

## 2016-09-17 LAB — PROTIME-INR: INR: 2.6 — AB (ref <=1.1)

## 2016-09-19 DIAGNOSIS — E1122 Type 2 diabetes mellitus with diabetic chronic kidney disease: Secondary | ICD-10-CM | POA: Diagnosis not present

## 2016-09-19 DIAGNOSIS — E1129 Type 2 diabetes mellitus with other diabetic kidney complication: Secondary | ICD-10-CM | POA: Diagnosis not present

## 2016-09-19 DIAGNOSIS — N2581 Secondary hyperparathyroidism of renal origin: Secondary | ICD-10-CM | POA: Diagnosis not present

## 2016-09-19 DIAGNOSIS — N186 End stage renal disease: Secondary | ICD-10-CM | POA: Diagnosis not present

## 2016-09-19 DIAGNOSIS — Z992 Dependence on renal dialysis: Secondary | ICD-10-CM | POA: Diagnosis not present

## 2016-09-22 DIAGNOSIS — N2581 Secondary hyperparathyroidism of renal origin: Secondary | ICD-10-CM | POA: Diagnosis not present

## 2016-09-22 DIAGNOSIS — E1129 Type 2 diabetes mellitus with other diabetic kidney complication: Secondary | ICD-10-CM | POA: Diagnosis not present

## 2016-09-22 DIAGNOSIS — Z992 Dependence on renal dialysis: Secondary | ICD-10-CM | POA: Diagnosis not present

## 2016-09-22 DIAGNOSIS — E1122 Type 2 diabetes mellitus with diabetic chronic kidney disease: Secondary | ICD-10-CM | POA: Diagnosis not present

## 2016-09-22 DIAGNOSIS — N186 End stage renal disease: Secondary | ICD-10-CM | POA: Diagnosis not present

## 2016-09-23 ENCOUNTER — Other Ambulatory Visit: Payer: Self-pay | Admitting: Endocrinology

## 2016-09-24 ENCOUNTER — Ambulatory Visit (INDEPENDENT_AMBULATORY_CARE_PROVIDER_SITE_OTHER): Payer: Medicare Other | Admitting: General Practice

## 2016-09-24 DIAGNOSIS — Z992 Dependence on renal dialysis: Secondary | ICD-10-CM | POA: Diagnosis not present

## 2016-09-24 DIAGNOSIS — I82492 Acute embolism and thrombosis of other specified deep vein of left lower extremity: Secondary | ICD-10-CM | POA: Diagnosis not present

## 2016-09-24 DIAGNOSIS — E1129 Type 2 diabetes mellitus with other diabetic kidney complication: Secondary | ICD-10-CM | POA: Diagnosis not present

## 2016-09-24 DIAGNOSIS — E1122 Type 2 diabetes mellitus with diabetic chronic kidney disease: Secondary | ICD-10-CM | POA: Diagnosis not present

## 2016-09-24 DIAGNOSIS — N2581 Secondary hyperparathyroidism of renal origin: Secondary | ICD-10-CM | POA: Diagnosis not present

## 2016-09-24 DIAGNOSIS — N186 End stage renal disease: Secondary | ICD-10-CM | POA: Diagnosis not present

## 2016-09-24 LAB — PROTIME-INR: INR: 2.8 — AB (ref <=1.1)

## 2016-09-24 NOTE — Patient Instructions (Signed)
Pre visit review using our clinic review tool, if applicable. No additional management support is needed unless otherwise documented below in the visit note. 

## 2016-09-24 NOTE — Progress Notes (Signed)
I have reviewed and agree with the plan. 

## 2016-09-26 DIAGNOSIS — E1129 Type 2 diabetes mellitus with other diabetic kidney complication: Secondary | ICD-10-CM | POA: Diagnosis not present

## 2016-09-26 DIAGNOSIS — N2581 Secondary hyperparathyroidism of renal origin: Secondary | ICD-10-CM | POA: Diagnosis not present

## 2016-09-26 DIAGNOSIS — Z992 Dependence on renal dialysis: Secondary | ICD-10-CM | POA: Diagnosis not present

## 2016-09-26 DIAGNOSIS — N186 End stage renal disease: Secondary | ICD-10-CM | POA: Diagnosis not present

## 2016-09-26 DIAGNOSIS — E1122 Type 2 diabetes mellitus with diabetic chronic kidney disease: Secondary | ICD-10-CM | POA: Diagnosis not present

## 2016-09-29 ENCOUNTER — Ambulatory Visit (INDEPENDENT_AMBULATORY_CARE_PROVIDER_SITE_OTHER): Payer: Medicare Other | Admitting: General Practice

## 2016-09-29 DIAGNOSIS — E1122 Type 2 diabetes mellitus with diabetic chronic kidney disease: Secondary | ICD-10-CM | POA: Diagnosis not present

## 2016-09-29 DIAGNOSIS — N2581 Secondary hyperparathyroidism of renal origin: Secondary | ICD-10-CM | POA: Diagnosis not present

## 2016-09-29 DIAGNOSIS — E1129 Type 2 diabetes mellitus with other diabetic kidney complication: Secondary | ICD-10-CM | POA: Diagnosis not present

## 2016-09-29 DIAGNOSIS — N186 End stage renal disease: Secondary | ICD-10-CM | POA: Diagnosis not present

## 2016-09-29 DIAGNOSIS — Z992 Dependence on renal dialysis: Secondary | ICD-10-CM | POA: Diagnosis not present

## 2016-09-29 NOTE — Progress Notes (Signed)
I have reviewed and agree with the plan. 

## 2016-09-29 NOTE — Patient Instructions (Signed)
Pre visit review using our clinic review tool, if applicable. No additional management support is needed unless otherwise documented below in the visit note. 

## 2016-10-01 DIAGNOSIS — Z992 Dependence on renal dialysis: Secondary | ICD-10-CM | POA: Diagnosis not present

## 2016-10-01 DIAGNOSIS — N2581 Secondary hyperparathyroidism of renal origin: Secondary | ICD-10-CM | POA: Diagnosis not present

## 2016-10-01 DIAGNOSIS — E1122 Type 2 diabetes mellitus with diabetic chronic kidney disease: Secondary | ICD-10-CM | POA: Diagnosis not present

## 2016-10-01 DIAGNOSIS — I82492 Acute embolism and thrombosis of other specified deep vein of left lower extremity: Secondary | ICD-10-CM | POA: Diagnosis not present

## 2016-10-01 DIAGNOSIS — E1129 Type 2 diabetes mellitus with other diabetic kidney complication: Secondary | ICD-10-CM | POA: Diagnosis not present

## 2016-10-01 DIAGNOSIS — N186 End stage renal disease: Secondary | ICD-10-CM | POA: Diagnosis not present

## 2016-10-01 LAB — POCT INR: INR: 2.3 — AB (ref 0.9–1.1)

## 2016-10-01 LAB — PROTIME-INR: Protime: 24.9 seconds — AB (ref 10.0–13.8)

## 2016-10-03 DIAGNOSIS — E1122 Type 2 diabetes mellitus with diabetic chronic kidney disease: Secondary | ICD-10-CM | POA: Diagnosis not present

## 2016-10-03 DIAGNOSIS — N2581 Secondary hyperparathyroidism of renal origin: Secondary | ICD-10-CM | POA: Diagnosis not present

## 2016-10-03 DIAGNOSIS — E1129 Type 2 diabetes mellitus with other diabetic kidney complication: Secondary | ICD-10-CM | POA: Diagnosis not present

## 2016-10-03 DIAGNOSIS — Z992 Dependence on renal dialysis: Secondary | ICD-10-CM | POA: Diagnosis not present

## 2016-10-03 DIAGNOSIS — N186 End stage renal disease: Secondary | ICD-10-CM | POA: Diagnosis not present

## 2016-10-04 DIAGNOSIS — E1122 Type 2 diabetes mellitus with diabetic chronic kidney disease: Secondary | ICD-10-CM | POA: Diagnosis not present

## 2016-10-04 DIAGNOSIS — E1129 Type 2 diabetes mellitus with other diabetic kidney complication: Secondary | ICD-10-CM | POA: Diagnosis not present

## 2016-10-04 DIAGNOSIS — N186 End stage renal disease: Secondary | ICD-10-CM | POA: Diagnosis not present

## 2016-10-04 DIAGNOSIS — Z992 Dependence on renal dialysis: Secondary | ICD-10-CM | POA: Diagnosis not present

## 2016-10-04 DIAGNOSIS — N2581 Secondary hyperparathyroidism of renal origin: Secondary | ICD-10-CM | POA: Diagnosis not present

## 2016-10-06 ENCOUNTER — Encounter: Payer: Self-pay | Admitting: Internal Medicine

## 2016-10-06 DIAGNOSIS — E1122 Type 2 diabetes mellitus with diabetic chronic kidney disease: Secondary | ICD-10-CM | POA: Diagnosis not present

## 2016-10-06 DIAGNOSIS — N2581 Secondary hyperparathyroidism of renal origin: Secondary | ICD-10-CM | POA: Diagnosis not present

## 2016-10-06 DIAGNOSIS — Z992 Dependence on renal dialysis: Secondary | ICD-10-CM | POA: Diagnosis not present

## 2016-10-06 DIAGNOSIS — E1129 Type 2 diabetes mellitus with other diabetic kidney complication: Secondary | ICD-10-CM | POA: Diagnosis not present

## 2016-10-06 DIAGNOSIS — N186 End stage renal disease: Secondary | ICD-10-CM | POA: Diagnosis not present

## 2016-10-06 NOTE — Progress Notes (Signed)
Results entered and sent to scan  

## 2016-10-08 DIAGNOSIS — E1129 Type 2 diabetes mellitus with other diabetic kidney complication: Secondary | ICD-10-CM | POA: Diagnosis not present

## 2016-10-08 DIAGNOSIS — M109 Gout, unspecified: Secondary | ICD-10-CM | POA: Diagnosis not present

## 2016-10-08 DIAGNOSIS — N186 End stage renal disease: Secondary | ICD-10-CM | POA: Diagnosis not present

## 2016-10-08 DIAGNOSIS — N2581 Secondary hyperparathyroidism of renal origin: Secondary | ICD-10-CM | POA: Diagnosis not present

## 2016-10-08 DIAGNOSIS — E1122 Type 2 diabetes mellitus with diabetic chronic kidney disease: Secondary | ICD-10-CM | POA: Diagnosis not present

## 2016-10-08 DIAGNOSIS — Z992 Dependence on renal dialysis: Secondary | ICD-10-CM | POA: Diagnosis not present

## 2016-10-10 DIAGNOSIS — E1122 Type 2 diabetes mellitus with diabetic chronic kidney disease: Secondary | ICD-10-CM | POA: Diagnosis not present

## 2016-10-10 DIAGNOSIS — N186 End stage renal disease: Secondary | ICD-10-CM | POA: Diagnosis not present

## 2016-10-10 DIAGNOSIS — Z992 Dependence on renal dialysis: Secondary | ICD-10-CM | POA: Diagnosis not present

## 2016-10-10 DIAGNOSIS — N2581 Secondary hyperparathyroidism of renal origin: Secondary | ICD-10-CM | POA: Diagnosis not present

## 2016-10-10 DIAGNOSIS — E1129 Type 2 diabetes mellitus with other diabetic kidney complication: Secondary | ICD-10-CM | POA: Diagnosis not present

## 2016-10-11 DIAGNOSIS — N186 End stage renal disease: Secondary | ICD-10-CM | POA: Diagnosis not present

## 2016-10-11 DIAGNOSIS — Z992 Dependence on renal dialysis: Secondary | ICD-10-CM | POA: Diagnosis not present

## 2016-10-11 DIAGNOSIS — E1122 Type 2 diabetes mellitus with diabetic chronic kidney disease: Secondary | ICD-10-CM | POA: Diagnosis not present

## 2016-10-13 DIAGNOSIS — N2581 Secondary hyperparathyroidism of renal origin: Secondary | ICD-10-CM | POA: Diagnosis not present

## 2016-10-13 DIAGNOSIS — N186 End stage renal disease: Secondary | ICD-10-CM | POA: Diagnosis not present

## 2016-10-13 DIAGNOSIS — E1129 Type 2 diabetes mellitus with other diabetic kidney complication: Secondary | ICD-10-CM | POA: Diagnosis not present

## 2016-10-13 DIAGNOSIS — Z992 Dependence on renal dialysis: Secondary | ICD-10-CM | POA: Diagnosis not present

## 2016-10-13 DIAGNOSIS — E1122 Type 2 diabetes mellitus with diabetic chronic kidney disease: Secondary | ICD-10-CM | POA: Diagnosis not present

## 2016-10-13 DIAGNOSIS — I5023 Acute on chronic systolic (congestive) heart failure: Secondary | ICD-10-CM | POA: Diagnosis not present

## 2016-10-13 DIAGNOSIS — E875 Hyperkalemia: Secondary | ICD-10-CM | POA: Diagnosis not present

## 2016-10-15 DIAGNOSIS — E1122 Type 2 diabetes mellitus with diabetic chronic kidney disease: Secondary | ICD-10-CM | POA: Diagnosis not present

## 2016-10-15 DIAGNOSIS — I82492 Acute embolism and thrombosis of other specified deep vein of left lower extremity: Secondary | ICD-10-CM | POA: Diagnosis not present

## 2016-10-15 DIAGNOSIS — I5023 Acute on chronic systolic (congestive) heart failure: Secondary | ICD-10-CM | POA: Diagnosis not present

## 2016-10-15 DIAGNOSIS — Z992 Dependence on renal dialysis: Secondary | ICD-10-CM | POA: Diagnosis not present

## 2016-10-15 DIAGNOSIS — N186 End stage renal disease: Secondary | ICD-10-CM | POA: Diagnosis not present

## 2016-10-15 DIAGNOSIS — N2581 Secondary hyperparathyroidism of renal origin: Secondary | ICD-10-CM | POA: Diagnosis not present

## 2016-10-15 LAB — PROTIME-INR: INR: 2.3 — AB (ref <=1.1)

## 2016-10-17 ENCOUNTER — Other Ambulatory Visit: Payer: Self-pay | Admitting: Endocrinology

## 2016-10-17 DIAGNOSIS — N2581 Secondary hyperparathyroidism of renal origin: Secondary | ICD-10-CM | POA: Diagnosis not present

## 2016-10-17 DIAGNOSIS — I5023 Acute on chronic systolic (congestive) heart failure: Secondary | ICD-10-CM | POA: Diagnosis not present

## 2016-10-17 DIAGNOSIS — N186 End stage renal disease: Secondary | ICD-10-CM | POA: Diagnosis not present

## 2016-10-17 DIAGNOSIS — Z992 Dependence on renal dialysis: Secondary | ICD-10-CM | POA: Diagnosis not present

## 2016-10-17 DIAGNOSIS — E1122 Type 2 diabetes mellitus with diabetic chronic kidney disease: Secondary | ICD-10-CM | POA: Diagnosis not present

## 2016-10-19 ENCOUNTER — Other Ambulatory Visit: Payer: Self-pay

## 2016-10-20 DIAGNOSIS — E1122 Type 2 diabetes mellitus with diabetic chronic kidney disease: Secondary | ICD-10-CM | POA: Diagnosis not present

## 2016-10-20 DIAGNOSIS — Z992 Dependence on renal dialysis: Secondary | ICD-10-CM | POA: Diagnosis not present

## 2016-10-20 DIAGNOSIS — N2581 Secondary hyperparathyroidism of renal origin: Secondary | ICD-10-CM | POA: Diagnosis not present

## 2016-10-20 DIAGNOSIS — N186 End stage renal disease: Secondary | ICD-10-CM | POA: Diagnosis not present

## 2016-10-20 DIAGNOSIS — I5023 Acute on chronic systolic (congestive) heart failure: Secondary | ICD-10-CM | POA: Diagnosis not present

## 2016-10-21 ENCOUNTER — Other Ambulatory Visit: Payer: Self-pay

## 2016-10-21 ENCOUNTER — Ambulatory Visit: Payer: Self-pay | Admitting: General Practice

## 2016-10-21 NOTE — Patient Instructions (Signed)
Pre visit review using our clinic review tool, if applicable. No additional management support is needed unless otherwise documented below in the visit note. 

## 2016-10-22 DIAGNOSIS — Z992 Dependence on renal dialysis: Secondary | ICD-10-CM | POA: Diagnosis not present

## 2016-10-22 DIAGNOSIS — I82492 Acute embolism and thrombosis of other specified deep vein of left lower extremity: Secondary | ICD-10-CM | POA: Diagnosis not present

## 2016-10-22 DIAGNOSIS — I5023 Acute on chronic systolic (congestive) heart failure: Secondary | ICD-10-CM | POA: Diagnosis not present

## 2016-10-22 DIAGNOSIS — E1122 Type 2 diabetes mellitus with diabetic chronic kidney disease: Secondary | ICD-10-CM | POA: Diagnosis not present

## 2016-10-22 DIAGNOSIS — N186 End stage renal disease: Secondary | ICD-10-CM | POA: Diagnosis not present

## 2016-10-22 DIAGNOSIS — N2581 Secondary hyperparathyroidism of renal origin: Secondary | ICD-10-CM | POA: Diagnosis not present

## 2016-10-22 LAB — PROTIME-INR: INR: 2 — AB (ref <=1.1)

## 2016-10-23 ENCOUNTER — Ambulatory Visit: Payer: Self-pay | Admitting: Endocrinology

## 2016-10-23 LAB — POCT INR: INR: 2 — AB (ref 0.9–1.1)

## 2016-10-23 LAB — PROTIME-INR: Protime: 22.1 seconds — AB (ref 10.0–13.8)

## 2016-10-24 DIAGNOSIS — E1122 Type 2 diabetes mellitus with diabetic chronic kidney disease: Secondary | ICD-10-CM | POA: Diagnosis not present

## 2016-10-24 DIAGNOSIS — I5023 Acute on chronic systolic (congestive) heart failure: Secondary | ICD-10-CM | POA: Diagnosis not present

## 2016-10-24 DIAGNOSIS — N186 End stage renal disease: Secondary | ICD-10-CM | POA: Diagnosis not present

## 2016-10-24 DIAGNOSIS — N2581 Secondary hyperparathyroidism of renal origin: Secondary | ICD-10-CM | POA: Diagnosis not present

## 2016-10-24 DIAGNOSIS — Z992 Dependence on renal dialysis: Secondary | ICD-10-CM | POA: Diagnosis not present

## 2016-10-26 ENCOUNTER — Ambulatory Visit: Payer: Self-pay | Admitting: Endocrinology

## 2016-10-26 ENCOUNTER — Other Ambulatory Visit (INDEPENDENT_AMBULATORY_CARE_PROVIDER_SITE_OTHER): Payer: Medicare Other

## 2016-10-26 DIAGNOSIS — Z794 Long term (current) use of insulin: Secondary | ICD-10-CM | POA: Diagnosis not present

## 2016-10-26 DIAGNOSIS — E1165 Type 2 diabetes mellitus with hyperglycemia: Secondary | ICD-10-CM

## 2016-10-26 LAB — GLUCOSE, RANDOM: Glucose, Bld: 145 mg/dL — ABNORMAL HIGH (ref 70–99)

## 2016-10-27 ENCOUNTER — Encounter: Payer: Self-pay | Admitting: Internal Medicine

## 2016-10-27 DIAGNOSIS — E1122 Type 2 diabetes mellitus with diabetic chronic kidney disease: Secondary | ICD-10-CM | POA: Diagnosis not present

## 2016-10-27 DIAGNOSIS — N186 End stage renal disease: Secondary | ICD-10-CM | POA: Diagnosis not present

## 2016-10-27 DIAGNOSIS — N2581 Secondary hyperparathyroidism of renal origin: Secondary | ICD-10-CM | POA: Diagnosis not present

## 2016-10-27 DIAGNOSIS — I5023 Acute on chronic systolic (congestive) heart failure: Secondary | ICD-10-CM | POA: Diagnosis not present

## 2016-10-27 DIAGNOSIS — Z992 Dependence on renal dialysis: Secondary | ICD-10-CM | POA: Diagnosis not present

## 2016-10-27 LAB — FRUCTOSAMINE: Fructosamine: 361 umol/L — ABNORMAL HIGH (ref 0–285)

## 2016-10-27 NOTE — Progress Notes (Unsigned)
Results entered and sent to scan  

## 2016-10-29 DIAGNOSIS — N2581 Secondary hyperparathyroidism of renal origin: Secondary | ICD-10-CM | POA: Diagnosis not present

## 2016-10-29 DIAGNOSIS — N186 End stage renal disease: Secondary | ICD-10-CM | POA: Diagnosis not present

## 2016-10-29 DIAGNOSIS — Z992 Dependence on renal dialysis: Secondary | ICD-10-CM | POA: Diagnosis not present

## 2016-10-29 DIAGNOSIS — I5023 Acute on chronic systolic (congestive) heart failure: Secondary | ICD-10-CM | POA: Diagnosis not present

## 2016-10-29 DIAGNOSIS — E1122 Type 2 diabetes mellitus with diabetic chronic kidney disease: Secondary | ICD-10-CM | POA: Diagnosis not present

## 2016-10-30 ENCOUNTER — Ambulatory Visit (INDEPENDENT_AMBULATORY_CARE_PROVIDER_SITE_OTHER): Payer: Medicare Other | Admitting: General Practice

## 2016-10-30 NOTE — Progress Notes (Signed)
I have reviewed and agree with the plan. 

## 2016-10-30 NOTE — Patient Instructions (Signed)
Pre visit review using our clinic review tool, if applicable. No additional management support is needed unless otherwise documented below in the visit note. 

## 2016-10-31 DIAGNOSIS — Z992 Dependence on renal dialysis: Secondary | ICD-10-CM | POA: Diagnosis not present

## 2016-10-31 DIAGNOSIS — I5023 Acute on chronic systolic (congestive) heart failure: Secondary | ICD-10-CM | POA: Diagnosis not present

## 2016-10-31 DIAGNOSIS — E1122 Type 2 diabetes mellitus with diabetic chronic kidney disease: Secondary | ICD-10-CM | POA: Diagnosis not present

## 2016-10-31 DIAGNOSIS — N2581 Secondary hyperparathyroidism of renal origin: Secondary | ICD-10-CM | POA: Diagnosis not present

## 2016-10-31 DIAGNOSIS — I82492 Acute embolism and thrombosis of other specified deep vein of left lower extremity: Secondary | ICD-10-CM | POA: Diagnosis not present

## 2016-10-31 DIAGNOSIS — N186 End stage renal disease: Secondary | ICD-10-CM | POA: Diagnosis not present

## 2016-11-02 LAB — POCT INR: INR: 2.6 — AB (ref 0.9–1.1)

## 2016-11-02 LAB — PROTIME-INR
INR: 2.6 — AB (ref <=1.1)
Protime: 27.1 seconds — AB (ref 10.0–13.8)

## 2016-11-03 ENCOUNTER — Encounter: Payer: Self-pay | Admitting: Internal Medicine

## 2016-11-03 DIAGNOSIS — N2581 Secondary hyperparathyroidism of renal origin: Secondary | ICD-10-CM | POA: Diagnosis not present

## 2016-11-03 DIAGNOSIS — I5023 Acute on chronic systolic (congestive) heart failure: Secondary | ICD-10-CM | POA: Diagnosis not present

## 2016-11-03 DIAGNOSIS — Z992 Dependence on renal dialysis: Secondary | ICD-10-CM | POA: Diagnosis not present

## 2016-11-03 DIAGNOSIS — N186 End stage renal disease: Secondary | ICD-10-CM | POA: Diagnosis not present

## 2016-11-03 DIAGNOSIS — E1122 Type 2 diabetes mellitus with diabetic chronic kidney disease: Secondary | ICD-10-CM | POA: Diagnosis not present

## 2016-11-03 NOTE — Progress Notes (Unsigned)
Results entered and sent to scan  

## 2016-11-05 ENCOUNTER — Ambulatory Visit (INDEPENDENT_AMBULATORY_CARE_PROVIDER_SITE_OTHER): Payer: Medicare Other | Admitting: General Practice

## 2016-11-05 DIAGNOSIS — N2581 Secondary hyperparathyroidism of renal origin: Secondary | ICD-10-CM | POA: Diagnosis not present

## 2016-11-05 DIAGNOSIS — M109 Gout, unspecified: Secondary | ICD-10-CM | POA: Diagnosis not present

## 2016-11-05 DIAGNOSIS — I82492 Acute embolism and thrombosis of other specified deep vein of left lower extremity: Secondary | ICD-10-CM | POA: Diagnosis not present

## 2016-11-05 DIAGNOSIS — Z992 Dependence on renal dialysis: Secondary | ICD-10-CM | POA: Diagnosis not present

## 2016-11-05 DIAGNOSIS — N186 End stage renal disease: Secondary | ICD-10-CM | POA: Diagnosis not present

## 2016-11-05 DIAGNOSIS — E1129 Type 2 diabetes mellitus with other diabetic kidney complication: Secondary | ICD-10-CM | POA: Diagnosis not present

## 2016-11-05 DIAGNOSIS — E1122 Type 2 diabetes mellitus with diabetic chronic kidney disease: Secondary | ICD-10-CM | POA: Diagnosis not present

## 2016-11-05 DIAGNOSIS — I5023 Acute on chronic systolic (congestive) heart failure: Secondary | ICD-10-CM | POA: Diagnosis not present

## 2016-11-05 NOTE — Patient Instructions (Signed)
Pre visit review using our clinic review tool, if applicable. No additional management support is needed unless otherwise documented below in the visit note. 

## 2016-11-07 DIAGNOSIS — N2581 Secondary hyperparathyroidism of renal origin: Secondary | ICD-10-CM | POA: Diagnosis not present

## 2016-11-07 DIAGNOSIS — Z992 Dependence on renal dialysis: Secondary | ICD-10-CM | POA: Diagnosis not present

## 2016-11-07 DIAGNOSIS — E1122 Type 2 diabetes mellitus with diabetic chronic kidney disease: Secondary | ICD-10-CM | POA: Diagnosis not present

## 2016-11-07 DIAGNOSIS — I5023 Acute on chronic systolic (congestive) heart failure: Secondary | ICD-10-CM | POA: Diagnosis not present

## 2016-11-07 DIAGNOSIS — N186 End stage renal disease: Secondary | ICD-10-CM | POA: Diagnosis not present

## 2016-11-09 ENCOUNTER — Encounter: Payer: Self-pay | Admitting: Endocrinology

## 2016-11-09 ENCOUNTER — Ambulatory Visit (INDEPENDENT_AMBULATORY_CARE_PROVIDER_SITE_OTHER): Payer: Medicare Other | Admitting: Endocrinology

## 2016-11-09 VITALS — BP 156/54 | Ht 72.0 in | Wt 276.0 lb

## 2016-11-09 DIAGNOSIS — E1165 Type 2 diabetes mellitus with hyperglycemia: Secondary | ICD-10-CM | POA: Diagnosis not present

## 2016-11-09 DIAGNOSIS — Z794 Long term (current) use of insulin: Secondary | ICD-10-CM

## 2016-11-09 NOTE — Progress Notes (Signed)
Patient ID: [name] [name], male   DOB: 1965-11-07, 51 y.o.   MRN: [id number]           Reason for Appointment: Follow-up for Type 2 Diabetes  History of Present Illness:          Date of diagnosis of type 2 diabetes mellitus : 1990       Background history:  He has been taking insulin since about 1999, previously was on unknown oral agents He had good control with insulin initially but A1c was much higher in 2008 and subsequently has had required larger doses of insulin He has been on Lantus for the last few years along with Novolog,followed by an endocrinologist since 2008 His A1c was 6.3 in 04/2014   Prior to his consultation he was on a regimen of Novolog , 50 units 5 times a day with each meal and snack along with 90 Units 3 times a day of Lantus   Recent history:   INSULIN regimen is :U-500 insulin with KwikPen 30  before bfst, 20 before lunch and 25-30 before dinner, take 30 min before   Sliding scale use for high sugars: Sugar 150-199 take 2 units, 200-250= 4 units and over 250 take 6 units  TOUJEO 90 units twice daily  A1c is fairly good at 7.1 in 11/17  Current blood sugar patterns and problems identified:  He did not bring his monitor today  He has checked his blood sugars mostly in the morning recently  He says that he does not check sugars very often because of an full fingersticks  Blood sugars are fairly good in the morning but occasionally has low blood sugars early morning  Also blood sugars later in the day are quite variable with some good readings in the afternoon also  However very rarely at night after supper  TOUJEO: He was told to reduce the evening dose to 80 units but he did not change his dose  Also he is arbitrarily taking various doses of the U-500 insulin and not following instructions given previously  He will usually not take any insulin on the morning of dialysis  He will sometimes take more insulin when he is eating rice  Not having  hypoglycemia during the day  Compliance with the medical regimen: usually good Hypoglycemia: As above  Glucose monitoring:  done 0-2 times a day         Glucometer:    Aviva      Blood Glucose readings by download:  Mean values apply above for all meters except median for One Touch  PRE-MEAL Fasting Lunch Dinner Bedtime Overall  Glucose range: 59-167  119-318  104-297  91-265    Mean/median: 122   177  132     Self-care: The diet that the patient has been following is: tries to limit high-fat meals, usually eating  2-3 meals a day.     Typical meal intake: Breakfast is grits with pecans, olive oil. Dinner 6 pm               Dietician visit, most recent:2013               Exercise:  unable to do any  Weight history:  Wt Readings from Last 3 Encounters:  11/09/16 276 lb (125.2 kg)  09/02/16 262 lb 6.4 oz (119 kg)  05/11/16 245 lb (111.1 kg)    Glycemic control:   Lab Results  Component Value Date   HGBA1C 7.1 (H) 08/28/2016  HGBA1C 7.2 (H) 11/22/2015   HGBA1C 7.7 (H) 10/17/2015   Lab Results  Component Value Date   MICROALBUR 232.9 (H) 06/19/2015   LDLCALC 67 08/28/2016   CREATININE 9.35 (HH) 05/11/2016       Allergies as of 11/09/2016      Reactions   Pork-derived Products Anaphylaxis, Other (See Comments)   Fever also    Ace Inhibitors Cough   Hydrocodone Other (See Comments)   Hallucinations      Medication List       Accurate as of 11/09/16  1:49 PM. Always use your most recent med list.          accu-chek multiclix lancets Check blood sugar four times daily   calcium acetate 667 MG capsule Commonly known as:  PHOSLO Take 3 capsules (2,001 mg total) by mouth 3 (three) times daily with meals.   clonazePAM 0.5 MG tablet Commonly known as:  KLONOPIN TAKE 2 TABLETS AT BEDTIME   cloNIDine 0.3 MG tablet Commonly known as:  CATAPRES TAKE 1 TABLET THREE TIMES DAILY   Febuxostat 80 MG Tabs Commonly known as:  ULORIC Take 1 tablet (80 mg total)  by mouth daily.   glucose blood test strip Commonly known as:  ACCU-CHEK GUIDE Check blood sugar four times daily   ACCU-CHEK SMARTVIEW test strip Generic drug:  glucose blood USE FOUR TIMES DAILY AS DIRECTED TO CHECK BLOOD SUGAR   HUMULIN R U-500 KWIKPEN 500 UNIT/ML kwikpen Generic drug:  insulin regular human CONCENTRATED INJECT 15 UNITS AT BREAKFAST, 25 UNITS AT LUNCH AND 20 UNITS BEFORE SUPPER   insulin aspart 100 UNIT/ML FlexPen Commonly known as:  NOVOLOG FLEXPEN Inject 6 Units into the skin 3 (three) times daily with meals.   Insulin Pen Needle 32G X 8 MM Misc Use as directed   BD PEN NEEDLE NANO U/F 32G X 4 MM Misc Generic drug:  Insulin Pen Needle USE  5  PER  DAY AS DIRECTED   labetalol 100 MG tablet Commonly known as:  NORMODYNE TAKE 1 TABLET THREE TIMES DAILY   omeprazole 20 MG capsule Commonly known as:  PRILOSEC Take 1 capsule (20 mg total) by mouth daily.   rosuvastatin 20 MG tablet Commonly known as:  CRESTOR Take 1 tablet (20 mg total) by mouth at bedtime.   tadalafil 5 MG tablet Commonly known as:  CIALIS Take 1 tablet (5 mg total) by mouth daily.   TOUJEO SOLOSTAR 300 UNIT/ML Sopn Generic drug:  Insulin Glargine INJECT  90 UNITS SUBCUTANEOUSLY TWICE DAILY   traMADol 50 MG tablet Commonly known as:  ULTRAM Take 1 tablet (50 mg total) by mouth every 8 (eight) hours as needed.   warfarin 5 MG tablet Commonly known as:  COUMADIN Take 1 (5 mg) tablet daily.       Allergies:  Allergies  Allergen Reactions  . Pork-Derived Products Anaphylaxis and Other (See Comments)    Fever also   . Ace Inhibitors Cough  . Hydrocodone Other (See Comments)    Hallucinations    Past Medical History:  Diagnosis Date  . Allergic rhinitis   . Anemia   . Aortic insufficiency    a. Echo 7/16:  Mod LVH, EF 50-55%, mild to mod AI, severe LAE, PASP 57 mmHg (LV ID end diastolic 62.7 mm);  b. Echo 2/17:  Mod LVH, EF 50-55%, mod AI, MAC, mild MR, mod LAE, mild  to mod TR, PASP 63 mmHg  . Bilateral carpal tunnel syndrome   . CHF (  congestive heart failure) ([place])   . Chronic kidney disease    Kidney Transplant 2009 09/26/15- transplanated kidney failing  . Chronic kidney disease (CKD), stage IV (severe) ([place])   . Chronic renal allograft nephropathy   . Claustrophobia   . Complication of anesthesia    "they said something at [facility] it was affected by sleep aqpnea."  . COPD (chronic obstructive pulmonary disease) ([place])   . Diabetes mellitus    Type II  . Diabetic peripheral neuropathy ([place])   . Diabetic retinopathy ([place])   . Diminished eyesight   . Discitis of lumbar region    resolved  . DVT (deep venous thrombosis) ([place]) 10/07/2015   LLE  . Dyslipidemia   . Endocarditis   . Endocarditis 11/28/14  . Gait disorder   . Gastroparesis   . GERD (gastroesophageal reflux disease)   . Gout   . Hearing difficulty   . Hyperlipidemia   . Hypertension   . Incomplete bladder emptying   . Leg pain 02/05/11   with walking  . Morbid obesity ([place])   . Nervousness(799.21)   . Obstructive sleep apnea    not using CPAP- lost 100lbs  . Osteomyelitis ([place])   . Posttraumatic stress disorder   . Renal transplant, status post 04/05/2014  . Rotator cuff disorder   . Secondary hyperparathyroidism ([place])   . Sepsis ([place])    Postive blood cultures -   . Shortness of breath    when lying flat  . Urethral stricture   . Vitamin D deficiency   . Weight gain     Past Surgical History:  Procedure Laterality Date  . ARTERIOVENOUS GRAFT PLACEMENT Bilateral "several"  . AV FISTULA PLACEMENT Bilateral 2015  . BASCILIC VEIN TRANSPOSITION Right 09/27/2015  . [place] Right 09/27/2015   Procedure: BASILIC VEIN TRANSPOSITION right;  Surgeon: Mal Misty, MD;  Location: [facility];  Service: Vascular;  Laterality: Right;  . CATARACT EXTRACTION W/ INTRAOCULAR LENS  IMPLANT, BILATERAL Bilateral   . CYSTOSCOPY W/ INTERNAL URETHROTOMY  09/2014    . KIDNEY TRANSPLANT  2009    Family History  Problem Relation Age of Onset  . Hypertension Mother   . Diabetes Father     Social History:  reports that he has never smoked. He has never used smokeless tobacco. He reports that he does not drink alcohol or use drugs.    Review of Systems    ROS    Lipid history: He is on 20 mg Crestor With the following results    Lab Results  Component Value Date   CHOL 124 08/28/2016   HDL 31.80 (L) 08/28/2016   LDLCALC 67 08/28/2016   TRIG 127.0 08/28/2016   CHOLHDL 4 08/28/2016            Eyes: Most recent eye exam was 6/15  Hypertension: His blood pressure is  controlled with 0.3 mg dose of clonidine along with labetalol and is followed by nephrologist   RENAL: He has had a transplant in 2009 and  he is on dialysis  Neurological: Has has had some decreased sensation in his feet, no burning, also sharp pains in his feet and toes, no tingling He has been given Lyrica by his neurologist and takes 50 mg at night   He has had marked decrease in his balance and also some weakness in his legs, using a power scooter  Last foot exam was in 03/2015: Absent monofilament sensation in his feet and toes and  extending up to the middle of the lower leg on the left and up to the ankle on the right  LABS:  Anti-coag visit on 11/05/2016  Component Date Value Ref Range Status  . INR 11/02/2016 2.6* 0.9 - 1.1 Final  Documentation on 11/03/2016  Component Date Value Ref Range Status  . INR 11/02/2016 2.6* 0.9 - 1.1 Final  . Protime 11/02/2016 27.1* 10.0 - 13.8 seconds Final    Physical Examination:  BP (!) 156/54   Ht 6' (1.829 m)   Wt 276 lb (125.2 kg)   SpO2 96%   BMI 37.43 kg/m       ASSESSMENT:  Diabetes type 2, long-standing with obesity See history of present illness for detailed discussion of his current management, blood sugar patterns and problems identified.  His blood sugars overall are fairly good although having more  variability in blood sugars in the afternoons Fasting readings are in range most of the time but he is now having some low sugars overnight, mostly early morning Most likely he needs somewhat more mealtime insulin and less basal insulin He thinks he is having difficulty checking his blood sugar because of painful fingers sticks and very little information is available about his postprandial readings especially after supper  LIPIDS: Needs follow-up In next 3-4 months  HYPERTENSION: Followed by nephrologist, blood pressure is relatively high today  PLAN:   He will reduce his Toujeo by 10 units in the evening for now  He will try to take more consistent amounts of mealtime insulin and only increase the dose when eating more carbohydrate  More blood sugars after lunch and supper and start rotating the times of blood sugar monitor  Discussed how the freestyle Libre system would be beneficial in monitoring his blood sugars more consistently without fingersticks and he will try to get this through his insurance.  Discussed details of the system and usage  He will call if he has more consistent hypoglycemia especially overnight  Consider consultation with dietitian  Patient Instructions  TOUJEO 90 IN AM AND 80 IN PM  ROTATE TIMES OF TESTING  Take Humulin R 30 min before meals   Counseling time on subjects discussed above is over 50% of today's 25 minute visit   Tammy Wickliffe 11/09/2016, 1:49 PM   Note: This office note was prepared with Estate agent. Any transcriptional errors that result from this process are unintentional.

## 2016-11-09 NOTE — Patient Instructions (Signed)
TOUJEO 90 IN AM AND 80 IN PM  ROTATE TIMES OF TESTING  Take Humulin R 30 min before meals

## 2016-11-10 DIAGNOSIS — N2581 Secondary hyperparathyroidism of renal origin: Secondary | ICD-10-CM | POA: Diagnosis not present

## 2016-11-10 DIAGNOSIS — N186 End stage renal disease: Secondary | ICD-10-CM | POA: Diagnosis not present

## 2016-11-10 DIAGNOSIS — Z992 Dependence on renal dialysis: Secondary | ICD-10-CM | POA: Diagnosis not present

## 2016-11-10 DIAGNOSIS — I5023 Acute on chronic systolic (congestive) heart failure: Secondary | ICD-10-CM | POA: Diagnosis not present

## 2016-11-10 DIAGNOSIS — E1122 Type 2 diabetes mellitus with diabetic chronic kidney disease: Secondary | ICD-10-CM | POA: Diagnosis not present

## 2016-11-11 ENCOUNTER — Ambulatory Visit (INDEPENDENT_AMBULATORY_CARE_PROVIDER_SITE_OTHER): Payer: Medicare Other | Admitting: Internal Medicine

## 2016-11-11 ENCOUNTER — Encounter: Payer: Self-pay | Admitting: Internal Medicine

## 2016-11-11 DIAGNOSIS — I825Y2 Chronic embolism and thrombosis of unspecified deep veins of left proximal lower extremity: Secondary | ICD-10-CM

## 2016-11-11 DIAGNOSIS — E1122 Type 2 diabetes mellitus with diabetic chronic kidney disease: Secondary | ICD-10-CM | POA: Diagnosis not present

## 2016-11-11 DIAGNOSIS — I10 Essential (primary) hypertension: Secondary | ICD-10-CM | POA: Diagnosis not present

## 2016-11-11 DIAGNOSIS — N186 End stage renal disease: Secondary | ICD-10-CM | POA: Diagnosis not present

## 2016-11-11 DIAGNOSIS — Z992 Dependence on renal dialysis: Secondary | ICD-10-CM | POA: Diagnosis not present

## 2016-11-11 NOTE — Progress Notes (Signed)
   Subjective:    Patient ID: [name] [name], male    DOB: 06-21-1966, 51 y.o.   MRN: [id number]  HPI The patient is a 51 YO man coming in for follow up of his blood pressure. He is still taking his medications as prescribed and denies side effect of the medicine. He denies headaches, chest pains, SOB, abdominal pain. He denies new concerns.  He is also here for follow up of his hx DVT. He is still taking coumadin daily and follows with our coumadin clinic because of his morbid obesity he is at high risk of recurrence. He denies excessive bleeding. Does sometimes bruise easily. Denies headaches or migraines. No dark stools or nose bleeding. He is wanting [place] to fill out his DMV form but he did not bring it with him except the eye page for his eye doctor to fill out.   Review of Systems  Constitutional: Negative for activity change, appetite change, fatigue, fever and unexpected weight change.  Respiratory: Negative.   Cardiovascular: Negative.   Gastrointestinal: Negative.   Musculoskeletal: Positive for arthralgias, gait problem and myalgias. Negative for back pain and joint swelling.  Neurological: Positive for weakness and numbness.      Objective:   Physical Exam  Constitutional: He is oriented to person, place, and time. He appears well-developed and well-nourished.  Obese  HENT:  Head: Normocephalic and atraumatic.  Right Ear: External ear normal.  Left Ear: External ear normal.  Eyes: EOM are normal.  Neck: Normal range of motion.  Cardiovascular: Normal rate and regular rhythm.   Pulmonary/Chest: Effort normal and breath sounds normal.  Abdominal: Soft. Bowel sounds are normal. He exhibits no distension. There is no tenderness. There is no rebound.  Neurological: He is alert and oriented to person, place, and time. Coordination abnormal.  Uses scooter chair and can transfer independently.    Vitals:   11/11/16 1049  BP: 140/60  Pulse: (!) 58  Resp: 14  Temp: 97.8 F (36.6  C)  TempSrc: Oral  SpO2: 96%  Weight: 275 lb 12 oz (125.1 kg)  Height: 6' (1.829 m)      Assessment & Plan:

## 2016-11-11 NOTE — Progress Notes (Signed)
Pre visit review using our clinic review tool, if applicable. No additional management support is needed unless otherwise documented below in the visit note. 

## 2016-11-11 NOTE — Patient Instructions (Signed)
We will fax the form to the eye doctor for you.   When you get the rest of the form back bring it to [place] to fill out and we will send it in.

## 2016-11-12 DIAGNOSIS — E1129 Type 2 diabetes mellitus with other diabetic kidney complication: Secondary | ICD-10-CM | POA: Diagnosis not present

## 2016-11-12 DIAGNOSIS — Z992 Dependence on renal dialysis: Secondary | ICD-10-CM | POA: Diagnosis not present

## 2016-11-12 DIAGNOSIS — I5023 Acute on chronic systolic (congestive) heart failure: Secondary | ICD-10-CM | POA: Diagnosis not present

## 2016-11-12 DIAGNOSIS — N2581 Secondary hyperparathyroidism of renal origin: Secondary | ICD-10-CM | POA: Diagnosis not present

## 2016-11-12 DIAGNOSIS — I82492 Acute embolism and thrombosis of other specified deep vein of left lower extremity: Secondary | ICD-10-CM | POA: Diagnosis not present

## 2016-11-12 DIAGNOSIS — D631 Anemia in chronic kidney disease: Secondary | ICD-10-CM | POA: Diagnosis not present

## 2016-11-12 DIAGNOSIS — N186 End stage renal disease: Secondary | ICD-10-CM | POA: Diagnosis not present

## 2016-11-12 DIAGNOSIS — E1122 Type 2 diabetes mellitus with diabetic chronic kidney disease: Secondary | ICD-10-CM | POA: Diagnosis not present

## 2016-11-13 ENCOUNTER — Telehealth: Payer: Self-pay | Admitting: Endocrinology

## 2016-11-13 ENCOUNTER — Telehealth (HOSPITAL_COMMUNITY): Payer: Self-pay | Admitting: Radiology

## 2016-11-13 NOTE — Telephone Encounter (Signed)
called to schedule echocardiogram

## 2016-11-13 NOTE — Telephone Encounter (Signed)
Completed and given to Venetie next day

## 2016-11-13 NOTE — Telephone Encounter (Signed)
Pt called in to check on the status of the paper that he dropped off the other day, he said he will be coming to pick it up today.  He stated it was just one page.

## 2016-11-13 NOTE — Assessment & Plan Note (Signed)
Needs to continue on coumadin for anticoagulation due to ongoing morbid obesity which was likely the cause of his DVT. If he is able to lose weight and regain some mobility could consider cessation in the future.

## 2016-11-13 NOTE — Telephone Encounter (Signed)
This is in file at front desk

## 2016-11-13 NOTE — Assessment & Plan Note (Signed)
BP at goal on his clonidine, labetalol. He gets labs through nephrology.

## 2016-11-14 DIAGNOSIS — N186 End stage renal disease: Secondary | ICD-10-CM | POA: Diagnosis not present

## 2016-11-14 DIAGNOSIS — E1122 Type 2 diabetes mellitus with diabetic chronic kidney disease: Secondary | ICD-10-CM | POA: Diagnosis not present

## 2016-11-14 DIAGNOSIS — Z992 Dependence on renal dialysis: Secondary | ICD-10-CM | POA: Diagnosis not present

## 2016-11-14 DIAGNOSIS — N2581 Secondary hyperparathyroidism of renal origin: Secondary | ICD-10-CM | POA: Diagnosis not present

## 2016-11-14 DIAGNOSIS — I5023 Acute on chronic systolic (congestive) heart failure: Secondary | ICD-10-CM | POA: Diagnosis not present

## 2016-11-14 DIAGNOSIS — D631 Anemia in chronic kidney disease: Secondary | ICD-10-CM | POA: Diagnosis not present

## 2016-11-18 DIAGNOSIS — I5023 Acute on chronic systolic (congestive) heart failure: Secondary | ICD-10-CM | POA: Diagnosis not present

## 2016-11-18 DIAGNOSIS — N2581 Secondary hyperparathyroidism of renal origin: Secondary | ICD-10-CM | POA: Diagnosis not present

## 2016-11-18 DIAGNOSIS — E1122 Type 2 diabetes mellitus with diabetic chronic kidney disease: Secondary | ICD-10-CM | POA: Diagnosis not present

## 2016-11-18 DIAGNOSIS — Z992 Dependence on renal dialysis: Secondary | ICD-10-CM | POA: Diagnosis not present

## 2016-11-18 DIAGNOSIS — D631 Anemia in chronic kidney disease: Secondary | ICD-10-CM | POA: Diagnosis not present

## 2016-11-18 DIAGNOSIS — N186 End stage renal disease: Secondary | ICD-10-CM | POA: Diagnosis not present

## 2016-11-19 DIAGNOSIS — N186 End stage renal disease: Secondary | ICD-10-CM | POA: Diagnosis not present

## 2016-11-19 DIAGNOSIS — E1122 Type 2 diabetes mellitus with diabetic chronic kidney disease: Secondary | ICD-10-CM | POA: Diagnosis not present

## 2016-11-19 DIAGNOSIS — I82492 Acute embolism and thrombosis of other specified deep vein of left lower extremity: Secondary | ICD-10-CM | POA: Diagnosis not present

## 2016-11-19 DIAGNOSIS — N2581 Secondary hyperparathyroidism of renal origin: Secondary | ICD-10-CM | POA: Diagnosis not present

## 2016-11-19 DIAGNOSIS — D631 Anemia in chronic kidney disease: Secondary | ICD-10-CM | POA: Diagnosis not present

## 2016-11-19 DIAGNOSIS — Z992 Dependence on renal dialysis: Secondary | ICD-10-CM | POA: Diagnosis not present

## 2016-11-19 DIAGNOSIS — I5023 Acute on chronic systolic (congestive) heart failure: Secondary | ICD-10-CM | POA: Diagnosis not present

## 2016-11-19 LAB — PROTIME-INR: INR: 2.9 — AB (ref <=1.1)

## 2016-11-21 DIAGNOSIS — Z992 Dependence on renal dialysis: Secondary | ICD-10-CM | POA: Diagnosis not present

## 2016-11-21 DIAGNOSIS — D631 Anemia in chronic kidney disease: Secondary | ICD-10-CM | POA: Diagnosis not present

## 2016-11-21 DIAGNOSIS — N2581 Secondary hyperparathyroidism of renal origin: Secondary | ICD-10-CM | POA: Diagnosis not present

## 2016-11-21 DIAGNOSIS — N186 End stage renal disease: Secondary | ICD-10-CM | POA: Diagnosis not present

## 2016-11-21 DIAGNOSIS — E1122 Type 2 diabetes mellitus with diabetic chronic kidney disease: Secondary | ICD-10-CM | POA: Diagnosis not present

## 2016-11-21 DIAGNOSIS — I5023 Acute on chronic systolic (congestive) heart failure: Secondary | ICD-10-CM | POA: Diagnosis not present

## 2016-11-24 ENCOUNTER — Ambulatory Visit (INDEPENDENT_AMBULATORY_CARE_PROVIDER_SITE_OTHER): Payer: Medicare Other | Admitting: General Practice

## 2016-11-24 DIAGNOSIS — E1122 Type 2 diabetes mellitus with diabetic chronic kidney disease: Secondary | ICD-10-CM | POA: Diagnosis not present

## 2016-11-24 DIAGNOSIS — Z992 Dependence on renal dialysis: Secondary | ICD-10-CM | POA: Diagnosis not present

## 2016-11-24 DIAGNOSIS — N2581 Secondary hyperparathyroidism of renal origin: Secondary | ICD-10-CM | POA: Diagnosis not present

## 2016-11-24 DIAGNOSIS — I5023 Acute on chronic systolic (congestive) heart failure: Secondary | ICD-10-CM | POA: Diagnosis not present

## 2016-11-24 DIAGNOSIS — D631 Anemia in chronic kidney disease: Secondary | ICD-10-CM | POA: Diagnosis not present

## 2016-11-24 DIAGNOSIS — N186 End stage renal disease: Secondary | ICD-10-CM | POA: Diagnosis not present

## 2016-11-24 NOTE — Patient Instructions (Signed)
Pre visit review using our clinic review tool, if applicable. No additional management support is needed unless otherwise documented below in the visit note. 

## 2016-11-25 NOTE — Progress Notes (Signed)
I have reviewed and agree with the plan. 

## 2016-11-26 DIAGNOSIS — D631 Anemia in chronic kidney disease: Secondary | ICD-10-CM | POA: Diagnosis not present

## 2016-11-26 DIAGNOSIS — N186 End stage renal disease: Secondary | ICD-10-CM | POA: Diagnosis not present

## 2016-11-26 DIAGNOSIS — Z992 Dependence on renal dialysis: Secondary | ICD-10-CM | POA: Diagnosis not present

## 2016-11-26 DIAGNOSIS — N2581 Secondary hyperparathyroidism of renal origin: Secondary | ICD-10-CM | POA: Diagnosis not present

## 2016-11-26 DIAGNOSIS — E1122 Type 2 diabetes mellitus with diabetic chronic kidney disease: Secondary | ICD-10-CM | POA: Diagnosis not present

## 2016-11-26 DIAGNOSIS — I82492 Acute embolism and thrombosis of other specified deep vein of left lower extremity: Secondary | ICD-10-CM | POA: Diagnosis not present

## 2016-11-26 DIAGNOSIS — I5023 Acute on chronic systolic (congestive) heart failure: Secondary | ICD-10-CM | POA: Diagnosis not present

## 2016-11-28 DIAGNOSIS — E1122 Type 2 diabetes mellitus with diabetic chronic kidney disease: Secondary | ICD-10-CM | POA: Diagnosis not present

## 2016-11-28 DIAGNOSIS — I5023 Acute on chronic systolic (congestive) heart failure: Secondary | ICD-10-CM | POA: Diagnosis not present

## 2016-11-28 DIAGNOSIS — D631 Anemia in chronic kidney disease: Secondary | ICD-10-CM | POA: Diagnosis not present

## 2016-11-28 DIAGNOSIS — N186 End stage renal disease: Secondary | ICD-10-CM | POA: Diagnosis not present

## 2016-11-28 DIAGNOSIS — N2581 Secondary hyperparathyroidism of renal origin: Secondary | ICD-10-CM | POA: Diagnosis not present

## 2016-11-28 DIAGNOSIS — Z992 Dependence on renal dialysis: Secondary | ICD-10-CM | POA: Diagnosis not present

## 2016-11-29 ENCOUNTER — Encounter (HOSPITAL_BASED_OUTPATIENT_CLINIC_OR_DEPARTMENT_OTHER): Payer: Self-pay | Admitting: *Deleted

## 2016-11-29 ENCOUNTER — Emergency Department (HOSPITAL_BASED_OUTPATIENT_CLINIC_OR_DEPARTMENT_OTHER)
Admission: EM | Admit: 2016-11-29 | Discharge: 2016-11-29 | Disposition: A | Discharge status: Home / Self Care | Payer: Medicare Other | Attending: Emergency Medicine | Admitting: Emergency Medicine

## 2016-11-29 DIAGNOSIS — Z992 Dependence on renal dialysis: Secondary | ICD-10-CM | POA: Insufficient documentation

## 2016-11-29 DIAGNOSIS — Z7901 Long term (current) use of anticoagulants: Secondary | ICD-10-CM | POA: Insufficient documentation

## 2016-11-29 DIAGNOSIS — S60459A Superficial foreign body of unspecified finger, initial encounter: Secondary | ICD-10-CM

## 2016-11-29 DIAGNOSIS — N186 End stage renal disease: Secondary | ICD-10-CM | POA: Diagnosis not present

## 2016-11-29 DIAGNOSIS — I509 Heart failure, unspecified: Secondary | ICD-10-CM | POA: Diagnosis not present

## 2016-11-29 DIAGNOSIS — Y999 Unspecified external cause status: Secondary | ICD-10-CM | POA: Insufficient documentation

## 2016-11-29 DIAGNOSIS — Y9389 Activity, other specified: Secondary | ICD-10-CM | POA: Diagnosis not present

## 2016-11-29 DIAGNOSIS — W228XXA Striking against or struck by other objects, initial encounter: Secondary | ICD-10-CM | POA: Insufficient documentation

## 2016-11-29 DIAGNOSIS — Y929 Unspecified place or not applicable: Secondary | ICD-10-CM | POA: Diagnosis not present

## 2016-11-29 DIAGNOSIS — Z794 Long term (current) use of insulin: Secondary | ICD-10-CM | POA: Insufficient documentation

## 2016-11-29 DIAGNOSIS — J449 Chronic obstructive pulmonary disease, unspecified: Secondary | ICD-10-CM | POA: Insufficient documentation

## 2016-11-29 DIAGNOSIS — S60351A Superficial foreign body of right thumb, initial encounter: Secondary | ICD-10-CM | POA: Insufficient documentation

## 2016-11-29 DIAGNOSIS — Z79899 Other long term (current) drug therapy: Secondary | ICD-10-CM | POA: Insufficient documentation

## 2016-11-29 DIAGNOSIS — E1122 Type 2 diabetes mellitus with diabetic chronic kidney disease: Secondary | ICD-10-CM | POA: Diagnosis not present

## 2016-11-29 DIAGNOSIS — L089 Local infection of the skin and subcutaneous tissue, unspecified: Secondary | ICD-10-CM

## 2016-11-29 DIAGNOSIS — I132 Hypertensive heart and chronic kidney disease with heart failure and with stage 5 chronic kidney disease, or end stage renal disease: Secondary | ICD-10-CM | POA: Diagnosis not present

## 2016-11-29 MED ORDER — HYDROCORTISONE 1 % EX CREA
TOPICAL_CREAM | CUTANEOUS | 0 refills | Status: DC
Start: 2016-11-29 — End: 2019-04-19

## 2016-11-29 MED ORDER — LIDOCAINE HCL (PF) 1 % IJ SOLN
5.0000 mL | Freq: Once | INTRAMUSCULAR | Status: AC
  Administered 2016-11-29: 5 mL
  Filled 2016-11-29: qty 5

## 2016-11-29 NOTE — ED Provider Notes (Addendum)
[facility] DEPT [facility] Provider Note   CSN: [id number] Arrival date & time: 11/29/16  1107     History   Chief Complaint Chief Complaint  Patient presents with  . Hand Pain    right thumb    HPI [name] [name] is a 51 y.o. male.  Patient is a 51 year old male with multiple medical problems including end-stage renal disease on dialysis, diabetes, COPD, aortic insufficiency, presenting today with 10 days of worsening right thumb pain. He thinks when he was cutting a pineapple something may have gotten shoved up his nail. He denies any systemic symptoms at this time such as fever, lightheadedness, chest pain, shortness of breath. He has not had any recent medication changes. Last dialysis was yesterday and full course. He is able to completely bend and straighten his thumb without problems and denies any redness or swelling.   The history is provided by the patient.    Past Medical History:  Diagnosis Date  . Allergic rhinitis   . Anemia   . Aortic insufficiency    a. Echo 7/16:  Mod LVH, EF 50-55%, mild to mod AI, severe LAE, PASP 57 mmHg (LV ID end diastolic 35.3 mm);  b. Echo 2/17:  Mod LVH, EF 50-55%, mod AI, MAC, mild MR, mod LAE, mild to mod TR, PASP 63 mmHg  . Bilateral carpal tunnel syndrome   . CHF (congestive heart failure) ([place])   . Chronic kidney disease    Kidney Transplant 2009 09/26/15- transplanated kidney failing  . Chronic kidney disease (CKD), stage IV (severe) ([place])   . Chronic renal allograft nephropathy   . Claustrophobia   . Complication of anesthesia    "they said something at [facility] it was affected by sleep aqpnea."  . COPD (chronic obstructive pulmonary disease) ([place])   . Diabetes mellitus    Type II  . Diabetic peripheral neuropathy ([place])   . Diabetic retinopathy ([place])   . Diminished eyesight   . Discitis of lumbar region    resolved  . DVT (deep venous thrombosis) ([place]) 10/07/2015   LLE  . Dyslipidemia   . Endocarditis   .  Endocarditis 11/28/14  . Gait disorder   . Gastroparesis   . GERD (gastroesophageal reflux disease)   . Gout   . Hearing difficulty   . Hyperlipidemia   . Hypertension   . Incomplete bladder emptying   . Leg pain 02/05/11   with walking  . Morbid obesity ([place])   . Nervousness(799.21)   . Obstructive sleep apnea    not using CPAP- lost 100lbs  . Osteomyelitis ([place])   . Posttraumatic stress disorder   . Renal transplant, status post 04/05/2014  . Rotator cuff disorder   . Secondary hyperparathyroidism ([place])   . Sepsis ([place])    Postive blood cultures -   . Shortness of breath    when lying flat  . Urethral stricture   . Vitamin D deficiency   . Weight gain     Patient Active Problem List   Diagnosis Date Noted  . ESRD on dialysis ([place]) 11/15/2015  . CHF (congestive heart failure) ([place]) 10/07/2015  . Prolonged Q-T interval on ECG 10/07/2015  . Chronic deep vein thrombosis (DVT) of left lower extremity ([place]) 10/07/2015  . Aortic insufficiency 04/12/2015  . Back pain at L4-L5 level   . Anemia due to chronic kidney disease   . Cervical radiculopathy   . Diabetic peripheral neuropathy ([place]) 10/16/2014  . Renal transplant, status post 04/05/2014  .  OSA (obstructive sleep apnea) 04/05/2014  . Severe obesity (BMI >= 40) ([place]) 04/05/2014  . Benign fibroma of prostate 02/05/2014  . Diabetic macular edema ([place]) 01/15/2012  . Diabetes mellitus, type 2 ([place]) 01/15/2012  . IMMUNOCOMPROMISED 01/30/2010  . HYPERPARATHYROIDISM, SECONDARY 01/27/2010  . GOUT 01/27/2010  . INSOMNIA 01/27/2010  . HLD (hyperlipidemia) 05/25/2008  . Essential hypertension 05/25/2008    Past Surgical History:  Procedure Laterality Date  . ARTERIOVENOUS GRAFT PLACEMENT Bilateral "several"  . AV FISTULA PLACEMENT Bilateral 2015  . BASCILIC VEIN TRANSPOSITION Right 09/27/2015  . [place] Right 09/27/2015   Procedure: BASILIC VEIN TRANSPOSITION right;  Surgeon: Mal Misty, MD;   Location: [facility];  Service: Vascular;  Laterality: Right;  . CATARACT EXTRACTION W/ INTRAOCULAR LENS  IMPLANT, BILATERAL Bilateral   . CYSTOSCOPY W/ INTERNAL URETHROTOMY  09/2014  . KIDNEY TRANSPLANT  2009       Home Medications    Prior to Admission medications   Medication Sig Start Date End Date Taking? Authorizing Provider  ACCU-CHEK SMARTVIEW test strip USE FOUR TIMES DAILY AS DIRECTED TO CHECK BLOOD SUGAR 09/23/16   Elayne Snare, MD  BD PEN NEEDLE NANO U/F 32G X 4 MM MISC USE  5  PER  DAY AS DIRECTED 08/26/16   Elayne Snare, MD  calcium acetate (PHOSLO) 667 MG capsule Take 3 capsules (2,001 mg total) by mouth 3 (three) times daily with meals. 10/26/15   Shanker Kristeen Mans, MD  clonazePAM (KLONOPIN) 0.5 MG tablet TAKE 2 TABLETS AT BEDTIME 07/14/16   Hoyt Koch, MD  cloNIDine (CATAPRES) 0.3 MG tablet TAKE 1 TABLET THREE TIMES DAILY 07/14/16   Hoyt Koch, MD  Febuxostat (ULORIC) 80 MG TABS Take 1 tablet (80 mg total) by mouth daily. 04/07/16   Hoyt Koch, MD  glucose blood (ACCU-CHEK GUIDE) test strip Check blood sugar four times daily 09/02/16   Elayne Snare, MD  HUMULIN R U-500 KWIKPEN 500 UNIT/ML kwikpen INJECT 15 UNITS AT BREAKFAST, 25 UNITS AT LUNCH AND 20 UNITS BEFORE SUPPER Patient taking differently: INJECT 20 UNITS AT BREAKFAST, 25 UNITS AT LUNCH AND 25 UNITS BEFORE SUPPER 10/18/16   Elayne Snare, MD  insulin aspart (NOVOLOG FLEXPEN) 100 UNIT/ML FlexPen Inject 6 Units into the skin 3 (three) times daily with meals. 10/26/15   Shanker Kristeen Mans, MD  Insulin Pen Needle 32G X 8 MM MISC Use as directed 10/26/15   Jonetta Osgood, MD  labetalol (NORMODYNE) 100 MG tablet TAKE 1 TABLET THREE TIMES DAILY 03/20/16   Hoyt Koch, MD  Lancets (ACCU-CHEK MULTICLIX) lancets Check blood sugar four times daily 09/02/16   Elayne Snare, MD  omeprazole (PRILOSEC) 20 MG capsule Take 1 capsule (20 mg total) by mouth daily. 04/07/16   Hoyt Koch, MD  rosuvastatin  (CRESTOR) 20 MG tablet Take 1 tablet (20 mg total) by mouth at bedtime. 04/07/16   Hoyt Koch, MD  tadalafil (CIALIS) 5 MG tablet Take 1 tablet (5 mg total) by mouth daily. 05/11/16   Hoyt Koch, MD  TOUJEO SOLOSTAR 300 UNIT/ML SOPN INJECT  90 UNITS SUBCUTANEOUSLY TWICE DAILY 08/26/16   Elayne Snare, MD  traMADol (ULTRAM) 50 MG tablet Take 1 tablet (50 mg total) by mouth every 8 (eight) hours as needed. Patient not taking: Reported on 11/11/2016 12/02/15   Hoyt Koch, MD  warfarin (COUMADIN) 5 MG tablet Take 1 (5 mg) tablet daily. 09/08/16   Hoyt Koch, MD    Family  History Family History  Problem Relation Age of Onset  . Hypertension Mother   . Diabetes Father     Social History Social History  Substance Use Topics  . Smoking status: Never Smoker  . Smokeless tobacco: Never Used  . Alcohol use No     Allergies   Pork-derived products; Ace inhibitors; and Hydrocodone   Review of Systems Review of Systems  Skin: Positive for rash.       Noticed a rash that does not itch or hurt to the back of the left leg thinks maybe it has been there 1 week  All other systems reviewed and are negative.    Physical Exam Updated Vital Signs BP 127/72 (BP Location: Right Arm)   Pulse (!) 136   Temp 98.4 F (36.9 C) (Oral)   Resp 20   Ht 6' (1.829 m)   Wt 271 lb 2.7 oz (123 kg)   SpO2 95%   BMI 36.78 kg/m   Physical Exam  Constitutional: He is oriented to person, place, and time. He appears well-developed and well-nourished. No distress.  HENT:  Head: Normocephalic and atraumatic.  Mouth/Throat: Oropharynx is clear and moist.  Eyes: Conjunctivae and EOM are normal. Pupils are equal, round, and reactive to light.  Neck: Normal range of motion. Neck supple.  Cardiovascular: Regular rhythm and intact distal pulses.  Tachycardia present.   No murmur heard. Tachycardic to 105  Pulmonary/Chest: Effort normal and breath sounds normal. No respiratory  distress. He has no wheezes. He has no rales.  Abdominal: Soft. He exhibits no distension. There is no tenderness. There is no rebound and no guarding.  Musculoskeletal: Normal range of motion. He exhibits no edema or tenderness.       Hands:      Legs: Neurological: He is alert and oriented to person, place, and time.  Skin: Skin is warm and dry. No rash noted. No erythema.  Psychiatric: He has a normal mood and affect. His behavior is normal.  Nursing note and vitals reviewed.    ED Treatments / Results  Labs (all labs ordered are listed, but only abnormal results are displayed) Labs Reviewed - No data to display  EKG  EKG Interpretation None       Radiology No results found.  Procedures .Foreign Body Removal Date/Time: 11/29/2016 12:56 PM Performed by: Blanchie Dessert Authorized by: Blanchie Dessert  Consent: Verbal consent obtained. Consent given by: patient Patient understanding: patient states understanding of the procedure being performed Body area: skin (under right thumb nail) Anesthesia: digital block  Anesthesia: Local Anesthetic: lidocaine 1% without epinephrine Anesthetic total: 4 mL  Sedation: Patient sedated: no Patient restrained: no Patient cooperative: yes Localization method: visualized Removal mechanism: alligator forceps (small portion of nail removed with release of pus) Dressing: antibiotic ointment Tendon involvement: none Depth: subcutaneous Complexity: simple 1 objects recovered. Objects recovered: dark hard substance removed Post-procedure assessment: foreign body removed Patient tolerance: Patient tolerated the procedure well with no immediate complications   (including critical care time)  Medications Ordered in ED Medications  lidocaine (PF) (XYLOCAINE) 1 % injection 5 mL (not administered)     Initial Impression / Assessment and Plan / ED Course  I have reviewed the triage vital signs and the nursing  notes.  Pertinent labs & imaging results that were available during my care of the patient were reviewed by me and considered in my medical decision making (see chart for details).     Patient with multiple medical problems who  is here today for most likely what appears to be a foreign body under the right thumbnail. He has no other complaints at this time area patient was initially tachycardic to 136 upon arrival but he states this was after he got his motorized wheelchair out of the car and was doing strenuous activity. This morning when he checked his heart rate was 80s and he denies any palpitations, chest pain or shortness of breath. He last dialyzed yesterday and was normal. We'll continue to monitor however heart rate has already improved to the low 100s and is regular. Will anesthetize the thumb and attempt to remove the foreign body which is most likely a spike from a pineapple.  12:57 PM FB removed.  Pt to f/u if worsens and to do soaks  Final Clinical Impressions(s) / ED Diagnoses   Final diagnoses:  Foreign body of finger with infection, initial encounter    New Prescriptions New Prescriptions   HYDROCORTISONE CREAM 1 %    Apply to affected area 2 times daily     Blanchie Dessert, MD 11/29/16 1257    Blanchie Dessert, MD 12/11/16 1308

## 2016-11-29 NOTE — ED Triage Notes (Signed)
Right thumb pain for the last two weeks.  No known injury.

## 2016-11-30 DIAGNOSIS — E103553 Type 1 diabetes mellitus with stable proliferative diabetic retinopathy, bilateral: Secondary | ICD-10-CM | POA: Diagnosis not present

## 2016-12-01 DIAGNOSIS — N2581 Secondary hyperparathyroidism of renal origin: Secondary | ICD-10-CM | POA: Diagnosis not present

## 2016-12-01 DIAGNOSIS — E1122 Type 2 diabetes mellitus with diabetic chronic kidney disease: Secondary | ICD-10-CM | POA: Diagnosis not present

## 2016-12-01 DIAGNOSIS — D631 Anemia in chronic kidney disease: Secondary | ICD-10-CM | POA: Diagnosis not present

## 2016-12-01 DIAGNOSIS — I5023 Acute on chronic systolic (congestive) heart failure: Secondary | ICD-10-CM | POA: Diagnosis not present

## 2016-12-01 DIAGNOSIS — Z992 Dependence on renal dialysis: Secondary | ICD-10-CM | POA: Diagnosis not present

## 2016-12-01 DIAGNOSIS — N186 End stage renal disease: Secondary | ICD-10-CM | POA: Diagnosis not present

## 2016-12-02 ENCOUNTER — Ambulatory Visit (HOSPITAL_COMMUNITY): Payer: Medicare Other | Attending: Internal Medicine

## 2016-12-03 DIAGNOSIS — N186 End stage renal disease: Secondary | ICD-10-CM | POA: Diagnosis not present

## 2016-12-03 DIAGNOSIS — N2581 Secondary hyperparathyroidism of renal origin: Secondary | ICD-10-CM | POA: Diagnosis not present

## 2016-12-03 DIAGNOSIS — D631 Anemia in chronic kidney disease: Secondary | ICD-10-CM | POA: Diagnosis not present

## 2016-12-03 DIAGNOSIS — E1122 Type 2 diabetes mellitus with diabetic chronic kidney disease: Secondary | ICD-10-CM | POA: Diagnosis not present

## 2016-12-03 DIAGNOSIS — Z992 Dependence on renal dialysis: Secondary | ICD-10-CM | POA: Diagnosis not present

## 2016-12-03 DIAGNOSIS — M109 Gout, unspecified: Secondary | ICD-10-CM | POA: Diagnosis not present

## 2016-12-03 DIAGNOSIS — I5023 Acute on chronic systolic (congestive) heart failure: Secondary | ICD-10-CM | POA: Diagnosis not present

## 2016-12-05 DIAGNOSIS — I5023 Acute on chronic systolic (congestive) heart failure: Secondary | ICD-10-CM | POA: Diagnosis not present

## 2016-12-05 DIAGNOSIS — N186 End stage renal disease: Secondary | ICD-10-CM | POA: Diagnosis not present

## 2016-12-05 DIAGNOSIS — Z992 Dependence on renal dialysis: Secondary | ICD-10-CM | POA: Diagnosis not present

## 2016-12-05 DIAGNOSIS — E1122 Type 2 diabetes mellitus with diabetic chronic kidney disease: Secondary | ICD-10-CM | POA: Diagnosis not present

## 2016-12-05 DIAGNOSIS — D631 Anemia in chronic kidney disease: Secondary | ICD-10-CM | POA: Diagnosis not present

## 2016-12-05 DIAGNOSIS — N2581 Secondary hyperparathyroidism of renal origin: Secondary | ICD-10-CM | POA: Diagnosis not present

## 2016-12-08 DIAGNOSIS — D631 Anemia in chronic kidney disease: Secondary | ICD-10-CM | POA: Diagnosis not present

## 2016-12-08 DIAGNOSIS — N186 End stage renal disease: Secondary | ICD-10-CM | POA: Diagnosis not present

## 2016-12-08 DIAGNOSIS — I5023 Acute on chronic systolic (congestive) heart failure: Secondary | ICD-10-CM | POA: Diagnosis not present

## 2016-12-08 DIAGNOSIS — Z992 Dependence on renal dialysis: Secondary | ICD-10-CM | POA: Diagnosis not present

## 2016-12-08 DIAGNOSIS — E1122 Type 2 diabetes mellitus with diabetic chronic kidney disease: Secondary | ICD-10-CM | POA: Diagnosis not present

## 2016-12-08 DIAGNOSIS — N2581 Secondary hyperparathyroidism of renal origin: Secondary | ICD-10-CM | POA: Diagnosis not present

## 2016-12-09 DIAGNOSIS — Z992 Dependence on renal dialysis: Secondary | ICD-10-CM | POA: Diagnosis not present

## 2016-12-09 DIAGNOSIS — E1122 Type 2 diabetes mellitus with diabetic chronic kidney disease: Secondary | ICD-10-CM | POA: Diagnosis not present

## 2016-12-09 DIAGNOSIS — N186 End stage renal disease: Secondary | ICD-10-CM | POA: Diagnosis not present

## 2016-12-10 DIAGNOSIS — E1129 Type 2 diabetes mellitus with other diabetic kidney complication: Secondary | ICD-10-CM | POA: Diagnosis not present

## 2016-12-10 DIAGNOSIS — I82492 Acute embolism and thrombosis of other specified deep vein of left lower extremity: Secondary | ICD-10-CM | POA: Diagnosis not present

## 2016-12-10 DIAGNOSIS — N2581 Secondary hyperparathyroidism of renal origin: Secondary | ICD-10-CM | POA: Diagnosis not present

## 2016-12-10 DIAGNOSIS — E1122 Type 2 diabetes mellitus with diabetic chronic kidney disease: Secondary | ICD-10-CM | POA: Diagnosis not present

## 2016-12-10 DIAGNOSIS — N186 End stage renal disease: Secondary | ICD-10-CM | POA: Diagnosis not present

## 2016-12-10 DIAGNOSIS — D631 Anemia in chronic kidney disease: Secondary | ICD-10-CM | POA: Diagnosis not present

## 2016-12-10 DIAGNOSIS — Z992 Dependence on renal dialysis: Secondary | ICD-10-CM | POA: Diagnosis not present

## 2016-12-10 LAB — PROTIME-INR: INR: 2.3 — AB (ref <=1.1)

## 2016-12-12 DIAGNOSIS — E1122 Type 2 diabetes mellitus with diabetic chronic kidney disease: Secondary | ICD-10-CM | POA: Diagnosis not present

## 2016-12-12 DIAGNOSIS — D631 Anemia in chronic kidney disease: Secondary | ICD-10-CM | POA: Diagnosis not present

## 2016-12-12 DIAGNOSIS — Z992 Dependence on renal dialysis: Secondary | ICD-10-CM | POA: Diagnosis not present

## 2016-12-12 DIAGNOSIS — N2581 Secondary hyperparathyroidism of renal origin: Secondary | ICD-10-CM | POA: Diagnosis not present

## 2016-12-12 DIAGNOSIS — N186 End stage renal disease: Secondary | ICD-10-CM | POA: Diagnosis not present

## 2016-12-15 ENCOUNTER — Encounter (HOSPITAL_COMMUNITY): Payer: Self-pay

## 2016-12-15 ENCOUNTER — Emergency Department (HOSPITAL_COMMUNITY): Payer: Medicare Other

## 2016-12-15 ENCOUNTER — Ambulatory Visit (INDEPENDENT_AMBULATORY_CARE_PROVIDER_SITE_OTHER): Payer: Medicare Other | Admitting: General Practice

## 2016-12-15 ENCOUNTER — Emergency Department (HOSPITAL_COMMUNITY)
Admission: EM | Admit: 2016-12-15 | Discharge: 2016-12-15 | Disposition: A | Discharge status: Home / Self Care | Payer: Medicare Other | Attending: Emergency Medicine | Admitting: Emergency Medicine

## 2016-12-15 DIAGNOSIS — E1140 Type 2 diabetes mellitus with diabetic neuropathy, unspecified: Secondary | ICD-10-CM | POA: Insufficient documentation

## 2016-12-15 DIAGNOSIS — R000 Tachycardia, unspecified: Secondary | ICD-10-CM | POA: Diagnosis not present

## 2016-12-15 DIAGNOSIS — Z940 Kidney transplant status: Secondary | ICD-10-CM | POA: Diagnosis not present

## 2016-12-15 DIAGNOSIS — I509 Heart failure, unspecified: Secondary | ICD-10-CM | POA: Insufficient documentation

## 2016-12-15 DIAGNOSIS — I132 Hypertensive heart and chronic kidney disease with heart failure and with stage 5 chronic kidney disease, or end stage renal disease: Secondary | ICD-10-CM | POA: Diagnosis not present

## 2016-12-15 DIAGNOSIS — Z794 Long term (current) use of insulin: Secondary | ICD-10-CM | POA: Diagnosis not present

## 2016-12-15 DIAGNOSIS — D631 Anemia in chronic kidney disease: Secondary | ICD-10-CM | POA: Diagnosis not present

## 2016-12-15 DIAGNOSIS — Z992 Dependence on renal dialysis: Secondary | ICD-10-CM | POA: Insufficient documentation

## 2016-12-15 DIAGNOSIS — N186 End stage renal disease: Secondary | ICD-10-CM | POA: Insufficient documentation

## 2016-12-15 DIAGNOSIS — N2581 Secondary hyperparathyroidism of renal origin: Secondary | ICD-10-CM | POA: Diagnosis not present

## 2016-12-15 DIAGNOSIS — Z79899 Other long term (current) drug therapy: Secondary | ICD-10-CM | POA: Diagnosis not present

## 2016-12-15 DIAGNOSIS — R0602 Shortness of breath: Secondary | ICD-10-CM | POA: Diagnosis not present

## 2016-12-15 DIAGNOSIS — E11319 Type 2 diabetes mellitus with unspecified diabetic retinopathy without macular edema: Secondary | ICD-10-CM | POA: Insufficient documentation

## 2016-12-15 DIAGNOSIS — E1122 Type 2 diabetes mellitus with diabetic chronic kidney disease: Secondary | ICD-10-CM | POA: Diagnosis not present

## 2016-12-15 DIAGNOSIS — J449 Chronic obstructive pulmonary disease, unspecified: Secondary | ICD-10-CM | POA: Diagnosis not present

## 2016-12-15 LAB — BASIC METABOLIC PANEL
Anion gap: 13 (ref 5–15)
BUN: 32 mg/dL — ABNORMAL HIGH (ref 6–20)
CO2: 28 mmol/L (ref 22–32)
Calcium: 8.8 mg/dL — ABNORMAL LOW (ref 8.9–10.3)
Chloride: 95 mmol/L — ABNORMAL LOW (ref 101–111)
Creatinine, Ser: 6.12 mg/dL — ABNORMAL HIGH (ref 0.61–1.24)
GFR calc Af Amer: 11 mL/min — ABNORMAL LOW (ref >60)
GFR calc non Af Amer: 9 mL/min — ABNORMAL LOW (ref >60)
Glucose, Bld: 144 mg/dL — ABNORMAL HIGH (ref 65–99)
Potassium: 4.6 mmol/L (ref 3.5–5.1)
Sodium: 136 mmol/L (ref 135–145)

## 2016-12-15 LAB — CBC WITH DIFFERENTIAL/PLATELET
Basophils Absolute: 0 10*3/uL (ref 0.0–0.1)
Basophils Relative: 0 %
Eosinophils Absolute: 0.1 10*3/uL (ref 0.0–0.7)
Eosinophils Relative: 1 %
HCT: 37.4 % — ABNORMAL LOW (ref 39.0–52.0)
Hemoglobin: 12.2 g/dL — ABNORMAL LOW (ref 13.0–17.0)
Lymphocytes Relative: 13 %
Lymphs Abs: 1 10*3/uL (ref 0.7–4.0)
MCH: 31.4 pg (ref 26.0–34.0)
MCHC: 32.6 g/dL (ref 30.0–36.0)
MCV: 96.4 fL (ref 78.0–100.0)
Monocytes Absolute: 0.6 10*3/uL (ref 0.1–1.0)
Monocytes Relative: 8 %
Neutro Abs: 5.8 10*3/uL (ref 1.7–7.7)
Neutrophils Relative %: 78 %
Platelets: 191 10*3/uL (ref 150–400)
RBC: 3.88 MIL/uL — ABNORMAL LOW (ref 4.22–5.81)
RDW: 15.1 % (ref 11.5–15.5)
WBC: 7.4 10*3/uL (ref 4.0–10.5)

## 2016-12-15 IMAGING — CR DG CHEST 1V PORT
2 series · 2 of 2 positions shown · non-contrast
Comparison: Chest x-ray 10/13/2015.

CLINICAL DATA: 49-year-old male with history of respiratory
failure.

EXAM:
PORTABLE CHEST 1 VIEW

[AP (1 of 2)]
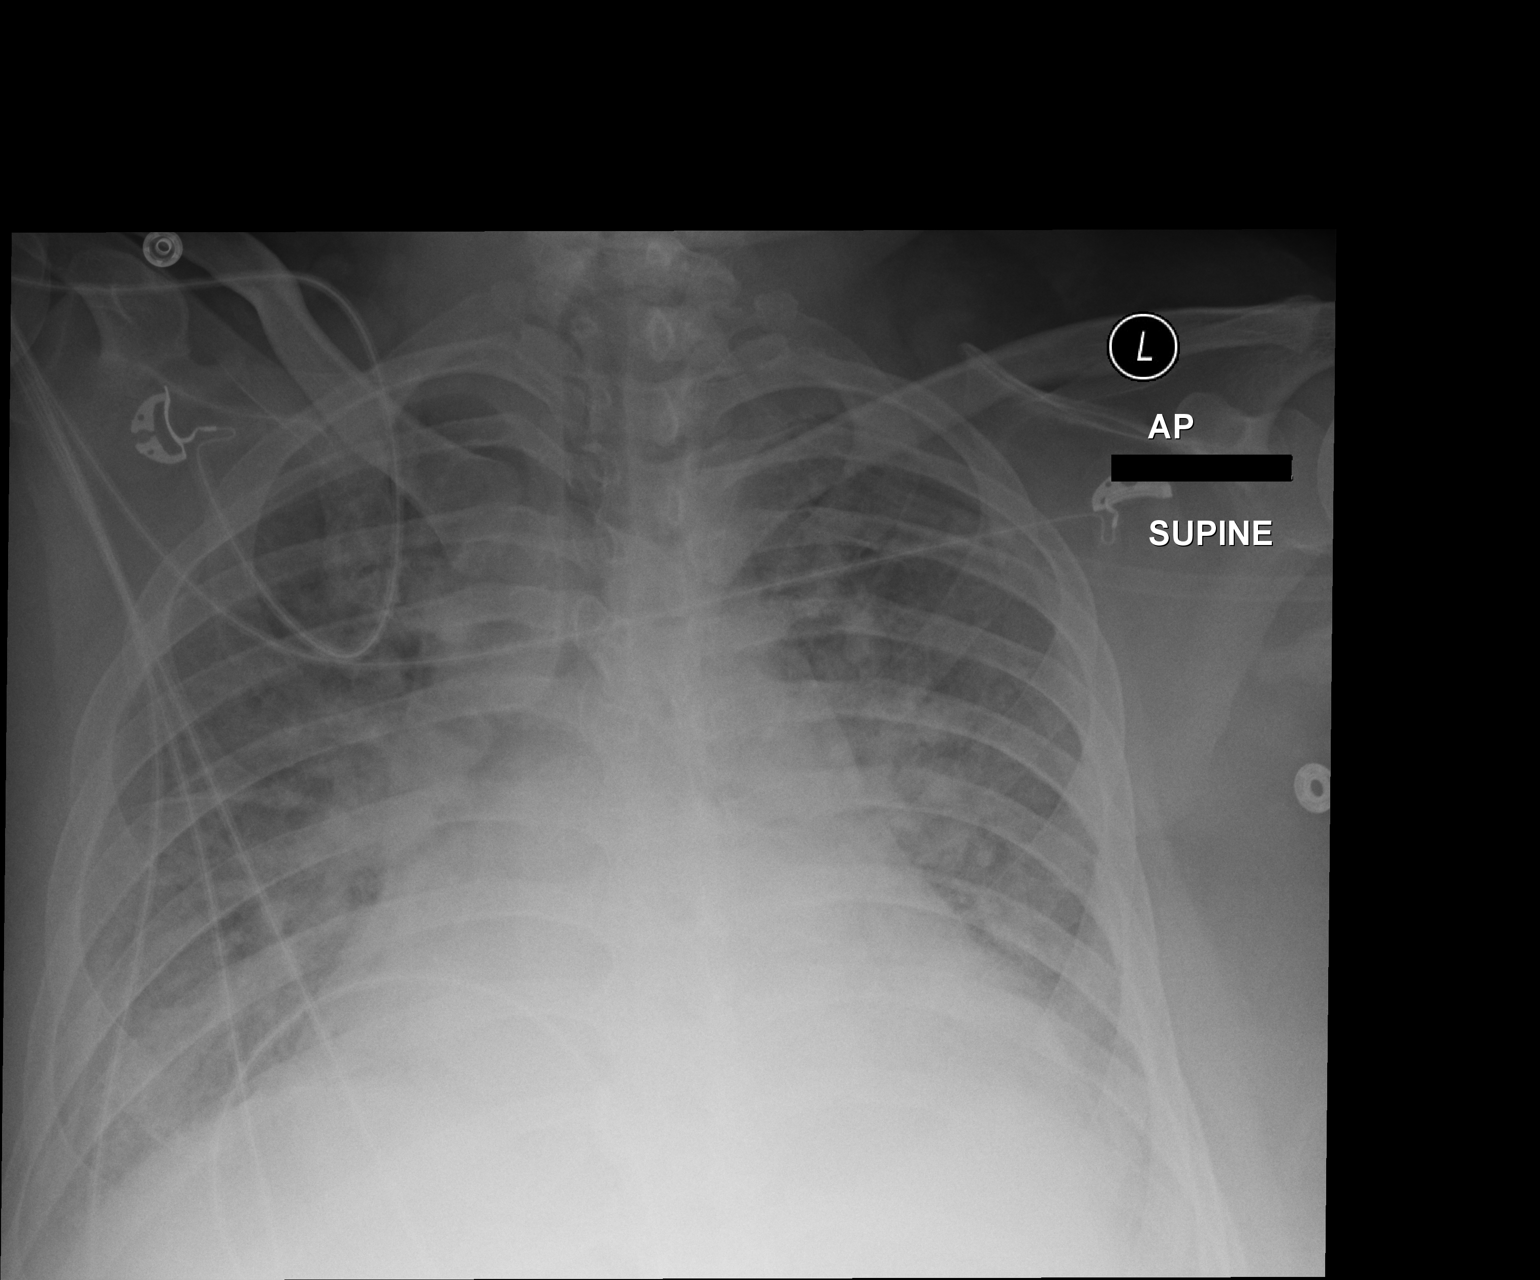

[AP (2 of 2)]
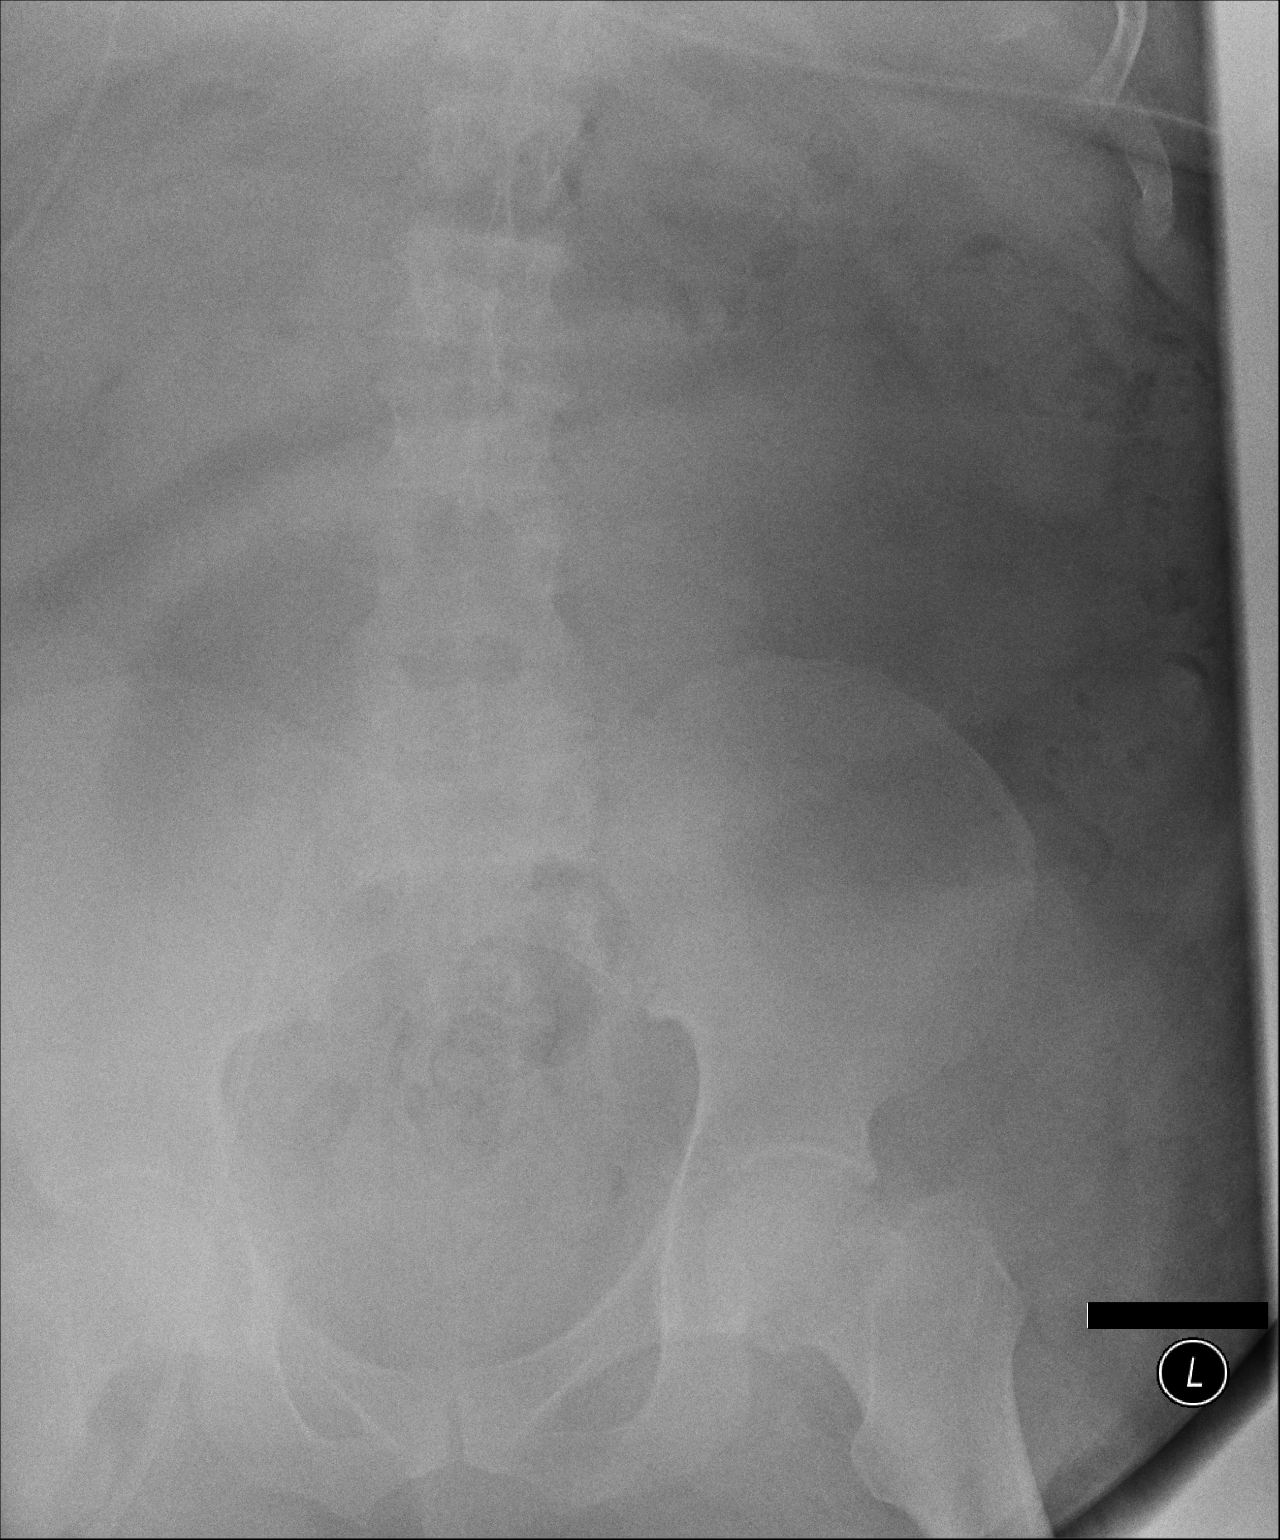

[2 of 2 positions shown; findings below may reference images not displayed]

FINDINGS: Low lung volumes. Bibasilar opacities favored to predominantly
reflect atelectasis. Moderate pulmonary edema. Small bilateral
pleural effusions. Mild cardiomegaly. Upper mediastinal contours are
distorted by patient's positioning and rotation to the right.
IMPRESSION: 1. The appearance the chest suggests congestive heart failure, as
above.

## 2016-12-15 MED ORDER — SODIUM CHLORIDE 0.9 % IV BOLUS (SEPSIS)
500.0000 mL | Freq: Once | INTRAVENOUS | Status: AC
  Administered 2016-12-15: 500 mL via INTRAVENOUS

## 2016-12-15 MED ORDER — SODIUM CHLORIDE 0.9 % IV BOLUS (SEPSIS): 1000.0000 mL | Freq: Once | INTRAVENOUS | Status: DC

## 2016-12-15 NOTE — ED Triage Notes (Signed)
Pt presents via [facility] from dialysis today. Reports 4/6 hours of treatment. Reports found to be tachycardic and headache. Pt has no other complaints. Denies CP/SOB. Pt AxO x4.

## 2016-12-15 NOTE — ED Provider Notes (Signed)
[facility] DEPT Provider Note   CSN: [id number] Arrival date & time: 12/15/16  1043     History   Chief Complaint Chief Complaint  Patient presents with  . Tachycardia  . Headache    HPI [name] [name] is a 51 y.o. male.  HPI   51 year old male presents today with complaints of tachycardia.  Patient is a poor historian.  He notes that before dialysis today he was noted to be tachycardic in the 11/01/1928 range.  He reports he feels slightly tired, but has no acute complaints including chest pain.  Patient underwent dialysis and had "5 kg" taken off today, and notes that this is normal for him.  He is a Tuesday Thursday Saturday patient.  Patient was noted to be slightly hypotensive during dialysis which again is not abnormal, he had associated headache at that time.  Dialysis was stopped for approximately 30 minutes, he was given Tylenol which stop his headache, he has not had headaches since.  Patient notes he remained normotensive but tachycardic after dialysis and was sent for further evaluation.  She denies any chest pain, fever.    Past Medical History:  Diagnosis Date  . Allergic rhinitis   . Anemia   . Aortic insufficiency    a. Echo 7/16:  Mod LVH, EF 50-55%, mild to mod AI, severe LAE, PASP 57 mmHg (LV ID end diastolic 09.4 mm);  b. Echo 2/17:  Mod LVH, EF 50-55%, mod AI, MAC, mild MR, mod LAE, mild to mod TR, PASP 63 mmHg  . Bilateral carpal tunnel syndrome   . CHF (congestive heart failure) ([place])   . Chronic kidney disease    Kidney Transplant 2009 09/26/15- transplanated kidney failing  . Chronic kidney disease (CKD), stage IV (severe) ([employer])   . Chronic renal allograft nephropathy   . Claustrophobia   . Complication of anesthesia    "they said something at [facility] it was affected by sleep aqpnea."  . COPD (chronic obstructive pulmonary disease) ([place])   . Diabetes mellitus    Type II  . Diabetic peripheral neuropathy ([place])   . Diabetic retinopathy ([place])     . Diminished eyesight   . Discitis of lumbar region    resolved  . DVT (deep venous thrombosis) ([place]) 10/07/2015   LLE  . Dyslipidemia   . Endocarditis   . Endocarditis 11/28/14  . Gait disorder   . Gastroparesis   . GERD (gastroesophageal reflux disease)   . Gout   . Hearing difficulty   . Hyperlipidemia   . Hypertension   . Incomplete bladder emptying   . Leg pain 02/05/11   with walking  . Morbid obesity ([place])   . Nervousness(799.21)   . Obstructive sleep apnea    not using CPAP- lost 100lbs  . Osteomyelitis ([place])   . Posttraumatic stress disorder   . Renal transplant, status post 04/05/2014  . Rotator cuff disorder   . Secondary hyperparathyroidism ([place])   . Sepsis ([place])    Postive blood cultures -   . Shortness of breath    when lying flat  . Urethral stricture   . Vitamin D deficiency   . Weight gain     Patient Active Problem List   Diagnosis Date Noted  . ESRD on dialysis ([place]) 11/15/2015  . CHF (congestive heart failure) ([place]) 10/07/2015  . Prolonged Q-T interval on ECG 10/07/2015  . Chronic deep vein thrombosis (DVT) of left lower extremity ([place]) 10/07/2015  . Aortic insufficiency 04/12/2015  .  Back pain at L4-L5 level   . Anemia due to chronic kidney disease   . Cervical radiculopathy   . Diabetic peripheral neuropathy ([place]) 10/16/2014  . Renal transplant, status post 04/05/2014  . OSA (obstructive sleep apnea) 04/05/2014  . Severe obesity (BMI >= 40) ([place]) 04/05/2014  . Benign fibroma of prostate 02/05/2014  . Diabetic macular edema ([place]) 01/15/2012  . Diabetes mellitus, type 2 ([place]) 01/15/2012  . IMMUNOCOMPROMISED 01/30/2010  . HYPERPARATHYROIDISM, SECONDARY 01/27/2010  . GOUT 01/27/2010  . INSOMNIA 01/27/2010  . HLD (hyperlipidemia) 05/25/2008  . Essential hypertension 05/25/2008    Past Surgical History:  Procedure Laterality Date  . ARTERIOVENOUS GRAFT PLACEMENT Bilateral "several"  . AV FISTULA PLACEMENT Bilateral 2015  . BASCILIC VEIN  TRANSPOSITION Right 09/27/2015  . [place] Right 09/27/2015   Procedure: BASILIC VEIN TRANSPOSITION right;  Surgeon: Mal Misty, MD;  Location: [facility];  Service: Vascular;  Laterality: Right;  . CATARACT EXTRACTION W/ INTRAOCULAR LENS  IMPLANT, BILATERAL Bilateral   . CYSTOSCOPY W/ INTERNAL URETHROTOMY  09/2014  . KIDNEY TRANSPLANT  2009       Home Medications    Prior to Admission medications   Medication Sig Start Date End Date Taking? Authorizing Provider  ACCU-CHEK SMARTVIEW test strip USE FOUR TIMES DAILY AS DIRECTED TO CHECK BLOOD SUGAR 09/23/16   Elayne Snare, MD  BD PEN NEEDLE NANO U/F 32G X 4 MM MISC USE  5  PER  DAY AS DIRECTED 08/26/16   Elayne Snare, MD  calcium acetate (PHOSLO) 667 MG capsule Take 3 capsules (2,001 mg total) by mouth 3 (three) times daily with meals. 10/26/15   Shanker Kristeen Mans, MD  clonazePAM (KLONOPIN) 0.5 MG tablet TAKE 2 TABLETS AT BEDTIME 07/14/16   Hoyt Koch, MD  cloNIDine (CATAPRES) 0.3 MG tablet TAKE 1 TABLET THREE TIMES DAILY 07/14/16   Hoyt Koch, MD  Febuxostat (ULORIC) 80 MG TABS Take 1 tablet (80 mg total) by mouth daily. 04/07/16   Hoyt Koch, MD  glucose blood (ACCU-CHEK GUIDE) test strip Check blood sugar four times daily 09/02/16   Elayne Snare, MD  HUMULIN R U-500 KWIKPEN 500 UNIT/ML kwikpen INJECT 15 UNITS AT BREAKFAST, 25 UNITS AT LUNCH AND 20 UNITS BEFORE SUPPER Patient taking differently: INJECT 20 UNITS AT BREAKFAST, 25 UNITS AT LUNCH AND 25 UNITS BEFORE SUPPER 10/18/16   Elayne Snare, MD  hydrocortisone cream 1 % Apply to affected area 2 times daily 11/29/16   Blanchie Dessert, MD  insulin aspart (NOVOLOG FLEXPEN) 100 UNIT/ML FlexPen Inject 6 Units into the skin 3 (three) times daily with meals. 10/26/15   Shanker Kristeen Mans, MD  Insulin Pen Needle 32G X 8 MM MISC Use as directed 10/26/15   Jonetta Osgood, MD  labetalol (NORMODYNE) 100 MG tablet TAKE 1 TABLET THREE TIMES DAILY 03/20/16   Hoyt Koch, MD  Lancets (ACCU-CHEK MULTICLIX) lancets Check blood sugar four times daily 09/02/16   Elayne Snare, MD  omeprazole (PRILOSEC) 20 MG capsule Take 1 capsule (20 mg total) by mouth daily. 04/07/16   Hoyt Koch, MD  rosuvastatin (CRESTOR) 20 MG tablet Take 1 tablet (20 mg total) by mouth at bedtime. 04/07/16   Hoyt Koch, MD  tadalafil (CIALIS) 5 MG tablet Take 1 tablet (5 mg total) by mouth daily. 05/11/16   Hoyt Koch, MD  TOUJEO SOLOSTAR 300 UNIT/ML SOPN INJECT  90 UNITS SUBCUTANEOUSLY TWICE DAILY 08/26/16   Elayne Snare, MD  traMADol (  ULTRAM) 50 MG tablet Take 1 tablet (50 mg total) by mouth every 8 (eight) hours as needed. Patient not taking: Reported on 11/11/2016 12/02/15   Hoyt Koch, MD  warfarin (COUMADIN) 5 MG tablet Take 1 (5 mg) tablet daily. 09/08/16   Hoyt Koch, MD    Family History Family History  Problem Relation Age of Onset  . Hypertension Mother   . Diabetes Father     Social History Social History  Substance Use Topics  . Smoking status: Never Smoker  . Smokeless tobacco: Never Used  . Alcohol use No     Allergies   Pork-derived products; Ace inhibitors; and Hydrocodone   Review of Systems Review of Systems  All other systems reviewed and are negative.    Physical Exam Updated Vital Signs BP 153/73 (BP Location: Left Arm)   Pulse 90   Temp 97.9 F (36.6 C) (Oral)   Resp 16   SpO2 98%   Physical Exam  Constitutional: He is oriented to person, place, and time. He appears well-developed and well-nourished.  HENT:  Head: Normocephalic and atraumatic.  Eyes: Conjunctivae are normal. Pupils are equal, round, and reactive to light. Right eye exhibits no discharge. Left eye exhibits no discharge. No scleral icterus.  Neck: Normal range of motion. No JVD present. No tracheal deviation present.  Cardiovascular: Regular rhythm, normal heart sounds and intact distal pulses.   Tachycardia     Pulmonary/Chest: Effort normal. No stridor.  Neurological: He is alert and oriented to person, place, and time. Coordination normal.  Psychiatric: He has a normal mood and affect. His behavior is normal. Judgment and thought content normal.  Nursing note and vitals reviewed.    ED Treatments / Results  Labs (all labs ordered are listed, but only abnormal results are displayed) Labs Reviewed  CBC WITH DIFFERENTIAL/PLATELET - Abnormal; Notable for the following:       Result Value   RBC 3.88 (*)    Hemoglobin 12.2 (*)    HCT 37.4 (*)    All other components within normal limits  BASIC METABOLIC PANEL - Abnormal; Notable for the following:    Chloride 95 (*)    Glucose, Bld 144 (*)    BUN 32 (*)    Creatinine, Ser 6.12 (*)    Calcium 8.8 (*)    GFR calc non Af Amer 9 (*)    GFR calc Af Amer 11 (*)    All other components within normal limits    EKG  EKG Interpretation  Date/Time:  Tuesday December 15 2016 12:09:22 EST Ventricular Rate:  134 PR Interval:    QRS Duration: 157 QT Interval:  348 QTC Calculation: 520 R Axis:   -88 Text Interpretation:  Sinus tachycardia RBBB and LAFB Inferior infarct, acute (RCA) Lateral leads are also involved Probable RV involvement, suggest recording right precordial leads Confirmed by LIU MD, DANA [phone]) on 12/15/2016 1:58:53 PM       Radiology Dg Chest 2 View  Result Date: 12/15/2016 CLINICAL DATA:  Tachypnea, shortness of breath, and dizziness today. History of CHF, COPD, diabetes. EXAM: CHEST  2 VIEW COMPARISON:  Chest x-ray of Feb 20, 2016 FINDINGS: The lungs are well-expanded. There is no focal infiltrate. The cardiac silhouette is enlarged. The pulmonary vascularity is engorged and exhibits cephalization. There is no pleural effusion. The trachea is midline. The bony thorax exhibits no acute abnormality. IMPRESSION: CHF with mild pulmonary interstitial edema.  No alveolar pneumonia. Electronically Signed   By: Shanon Brow  [place] M.D.   On:  12/15/2016 14:55    Procedures Procedures (including critical care time)  Medications Ordered in ED Medications  sodium chloride 0.9 % bolus 500 mL (0 mLs Intravenous Stopped 12/15/16 1324)     Initial Impression / Assessment and Plan / ED Course  I have reviewed the triage vital signs and the nursing notes.  Pertinent labs & imaging results that were available during my care of the patient were reviewed by me and considered in my medical decision making (see chart for details).      Final Clinical Impressions(s) / ED Diagnoses   Final diagnoses:  Tachycardia   Labs:  CBC,BMP  Imaging: ED EKG  Consults:  Therapeutics: NS  Discharge Meds:   Assessment/Plan: 51 year old male presents today with tachycardia.  Patient is a poor historian, but this does sound like he had fluid taken off, developed tachycardia thereafter.  Patient was given saline bolus here which improved his tachycardia.  Patient has no signs or symptoms consistent with ACS, very low suspicion for PE in this patient. I personally ambulated him with the pulse ox monitor, he did not have any significant tachycardia, dizziness, lightheadedness, chest pain, shortness of breath.  Patient's symptoms completely resolved he will be discharged home with primary care follow-up, strict return precautions.  He verbalized understanding and agreement to this plan and had no further questions or concerns at time of discharge  New Prescriptions Discharge Medication List as of 12/15/2016  4:06 PM       Okey Regal, PA-C 12/15/16 2101    Forde Dandy, MD 12/16/16 [phone]

## 2016-12-15 NOTE — Discharge Instructions (Signed)
Your tachycardia today is likely due to dialysis and loss of fluids.  Please contact her primary care provider and inform them of today's visit and all relevant data.  Please maintain adequate diet, return to the emergency room immediately if you develop any new or worsening signs or symptoms.

## 2016-12-15 NOTE — ED Notes (Signed)
Patient transported to X-ray 

## 2016-12-15 NOTE — ED Provider Notes (Signed)
Medical screening examination/treatment/procedure(s) were conducted as a shared visit with non-physician practitioner(s) and myself.  I personally evaluated the patient during the encounter.   EKG Interpretation  Date/Time:  Tuesday December 15 2016 12:09:22 EST Ventricular Rate:  134 PR Interval:    QRS Duration: 157 QT Interval:  348 QTC Calculation: 520 R Axis:   -88 Text Interpretation:  Sinus tachycardia RBBB and LAFB Inferior infarct, acute (RCA) Lateral leads are also involved Probable RV involvement, suggest recording right precordial leads Confirmed by Kynsley Whitehouse MD, Tupac Jeffus ([id number]) on 12/15/2016 1:58:27 PM      51 year old male Who presents with tachycardia. He has a history of end-stage renal disease on hemodialysis. He states that he has been in his usual state of health and went for routine dialysis today. After dialysis he was noted to have heart rate around 120 to 130 and he had a mild headache. He was sent to the ED for evaluation. He feels like he is at his baseline state of health. He denies any associating shortness of breath, chest pain, nausea or vomiting, vision or speech changes, focal numbness or weakness, abdominal pain, fevers or chills, diarrhea. No recent illnesses.  On arrival heart rate of 130s. EKG suggestive of sinus tachycardia with known right bundle branch block and left anterior fascicular block. Similar to prior EKG aside from tachycardia. No other symptoms of acute cardiopulmonary processes or infectious processes. He did receive small 500 mL bolus of fluids, and tachycardia resolved. At baseline of 80s to 90s. Maybe he was over dialyzed today. Low suspicion for other serious processes such as infection, PE, ACS. Ambulates without tachycardia or hypoxia. Since feels at baseline felt to be stable for discharge home. Strict return and follow-up instructions reviewed. He expressed understanding of all discharge instructions and felt comfortable with the plan of care.    Forde Dandy, MD 12/15/16 [phone]

## 2016-12-15 NOTE — Progress Notes (Signed)
I have reviewed and agree with the plan. 

## 2016-12-15 NOTE — Patient Instructions (Signed)
Pre visit review using our clinic review tool, if applicable. No additional management support is needed unless otherwise documented below in the visit note. 

## 2016-12-17 DIAGNOSIS — Z992 Dependence on renal dialysis: Secondary | ICD-10-CM | POA: Diagnosis not present

## 2016-12-17 DIAGNOSIS — I82492 Acute embolism and thrombosis of other specified deep vein of left lower extremity: Secondary | ICD-10-CM | POA: Diagnosis not present

## 2016-12-17 DIAGNOSIS — D631 Anemia in chronic kidney disease: Secondary | ICD-10-CM | POA: Diagnosis not present

## 2016-12-17 DIAGNOSIS — E1122 Type 2 diabetes mellitus with diabetic chronic kidney disease: Secondary | ICD-10-CM | POA: Diagnosis not present

## 2016-12-17 DIAGNOSIS — N2581 Secondary hyperparathyroidism of renal origin: Secondary | ICD-10-CM | POA: Diagnosis not present

## 2016-12-17 DIAGNOSIS — N186 End stage renal disease: Secondary | ICD-10-CM | POA: Diagnosis not present

## 2016-12-18 LAB — PROTIME-INR: INR: 2.6 — AB (ref <=1.1)

## 2016-12-19 DIAGNOSIS — D631 Anemia in chronic kidney disease: Secondary | ICD-10-CM | POA: Diagnosis not present

## 2016-12-19 DIAGNOSIS — Z992 Dependence on renal dialysis: Secondary | ICD-10-CM | POA: Diagnosis not present

## 2016-12-19 DIAGNOSIS — N2581 Secondary hyperparathyroidism of renal origin: Secondary | ICD-10-CM | POA: Diagnosis not present

## 2016-12-19 DIAGNOSIS — E1122 Type 2 diabetes mellitus with diabetic chronic kidney disease: Secondary | ICD-10-CM | POA: Diagnosis not present

## 2016-12-19 DIAGNOSIS — N186 End stage renal disease: Secondary | ICD-10-CM | POA: Diagnosis not present

## 2016-12-20 IMAGING — US IR US GUIDE VASC ACCESS RIGHT
1 series · 2 of 2 positions shown · non-contrast
Comparison: none

INDICATION: 49-year-old male with chronic kidney disease requiring hemodialysis.
He recently had line sepsis and has been on a several Elodia Azhar
Sainey. He has a newly placed right upper extremity arteriovenous
fistula which is still in the process of maturing. He needs durable
dialysis access until his AVF is fully matured and accessible.

[Series 1: ir fluoro/shunt/fist · 2 of 2 slices shown]
[im 1/2]
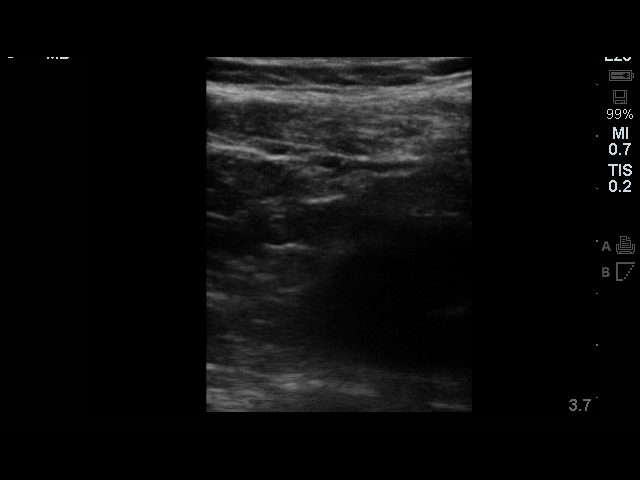
[im 2/2]
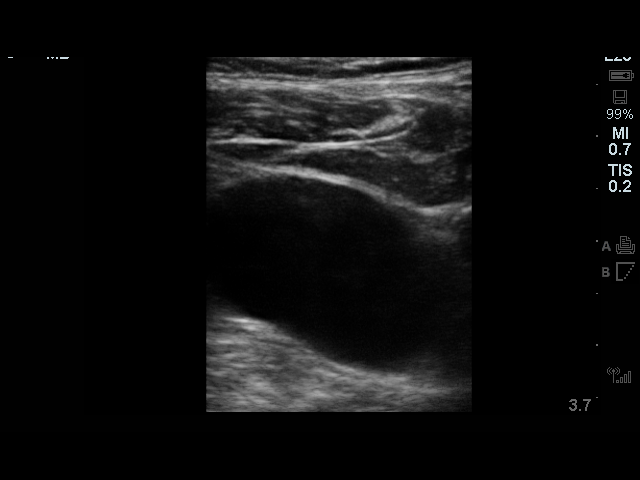

[2 of 2 positions shown; findings below may reference images not displayed]

EXAM:
TUNNELED CENTRAL VENOUS HEMODIALYSIS CATHETER PLACEMENT WITH
ULTRASOUND AND FLUOROSCOPIC GUIDANCE

MEDICATIONS:
Ancef 2 gm IV; The IV antibiotic was given in an appropriate time
interval prior to skin puncture.

CONTRAST:  None

ANESTHESIA/SEDATION:
Versed 1.5 mg IV; Fentanyl 75 mcg IV

Total Moderate Sedation Time

16 minutes.

FLUOROSCOPY TIME:  36 seconds for a total of 8 mGy

COMPLICATIONS:
None immediate



After creating a small venotomy incision, a micropuncture kit was
utilized to access the right internal jugular vein under direct,
real-time ultrasound guidance after the overlying soft tissues were
anesthetized with 1% lidocaine with epinephrine. Ultrasound image
documentation was performed. The microwire was kinked to measure
appropriate catheter length. A stiff glidewire was advanced to the
level of the IVC and the micropuncture sheath was exchanged for a
peel-away sheath. A Palindrome tunneled hemodialysis catheter
measuring 23 cm from tip to cuff was tunneled in a retrograde
fashion from the anterior chest wall to the venotomy incision.

The catheter was then placed through the peel-away sheath with tips
ultimately positioned within the superior aspect of the right
atrium. Final catheter positioning was confirmed and documented with
a spot radiographic image. The catheter aspirates and flushes
normally. The catheter was flushed with appropriate volume heparin
dwells.

The catheter exit site was secured with a 0-Prolene retention
suture. The venotomy incision was closed with Dermabond. Dressings
were applied. The patient tolerated the procedure well without
immediate post procedural complication.
IMPRESSION: Successful placement of 23 cm tip to cuff tunneled hemodialysis
catheter via the right internal jugular vein with tips terminating
within the superior aspect of the right atrium. The catheter is
ready for immediate use.

## 2016-12-21 ENCOUNTER — Other Ambulatory Visit: Payer: Self-pay | Admitting: Endocrinology

## 2016-12-22 ENCOUNTER — Ambulatory Visit (INDEPENDENT_AMBULATORY_CARE_PROVIDER_SITE_OTHER): Payer: Medicare Other | Admitting: General Practice

## 2016-12-22 DIAGNOSIS — N2581 Secondary hyperparathyroidism of renal origin: Secondary | ICD-10-CM | POA: Diagnosis not present

## 2016-12-22 DIAGNOSIS — N186 End stage renal disease: Secondary | ICD-10-CM | POA: Diagnosis not present

## 2016-12-22 DIAGNOSIS — D631 Anemia in chronic kidney disease: Secondary | ICD-10-CM | POA: Diagnosis not present

## 2016-12-22 DIAGNOSIS — E1122 Type 2 diabetes mellitus with diabetic chronic kidney disease: Secondary | ICD-10-CM | POA: Diagnosis not present

## 2016-12-22 DIAGNOSIS — Z992 Dependence on renal dialysis: Secondary | ICD-10-CM | POA: Diagnosis not present

## 2016-12-22 NOTE — Progress Notes (Signed)
I have reviewed and agree with the plan. 

## 2016-12-22 NOTE — Patient Instructions (Signed)
Pre visit review using our clinic review tool, if applicable. No additional management support is needed unless otherwise documented below in the visit note. 

## 2016-12-23 ENCOUNTER — Other Ambulatory Visit: Payer: Self-pay

## 2016-12-23 ENCOUNTER — Ambulatory Visit (HOSPITAL_COMMUNITY): Payer: Medicare Other | Attending: Cardiovascular Disease

## 2016-12-23 DIAGNOSIS — I110 Hypertensive heart disease with heart failure: Secondary | ICD-10-CM | POA: Diagnosis not present

## 2016-12-23 DIAGNOSIS — Z940 Kidney transplant status: Secondary | ICD-10-CM | POA: Insufficient documentation

## 2016-12-23 DIAGNOSIS — D649 Anemia, unspecified: Secondary | ICD-10-CM | POA: Insufficient documentation

## 2016-12-23 DIAGNOSIS — I5032 Chronic diastolic (congestive) heart failure: Secondary | ICD-10-CM | POA: Diagnosis not present

## 2016-12-23 DIAGNOSIS — I082 Rheumatic disorders of both aortic and tricuspid valves: Secondary | ICD-10-CM | POA: Insufficient documentation

## 2016-12-23 DIAGNOSIS — E785 Hyperlipidemia, unspecified: Secondary | ICD-10-CM | POA: Insufficient documentation

## 2016-12-23 DIAGNOSIS — Z86718 Personal history of other venous thrombosis and embolism: Secondary | ICD-10-CM | POA: Insufficient documentation

## 2016-12-23 DIAGNOSIS — E119 Type 2 diabetes mellitus without complications: Secondary | ICD-10-CM | POA: Diagnosis not present

## 2016-12-23 DIAGNOSIS — G4733 Obstructive sleep apnea (adult) (pediatric): Secondary | ICD-10-CM | POA: Insufficient documentation

## 2016-12-23 DIAGNOSIS — I351 Nonrheumatic aortic (valve) insufficiency: Secondary | ICD-10-CM | POA: Diagnosis not present

## 2016-12-24 DIAGNOSIS — D631 Anemia in chronic kidney disease: Secondary | ICD-10-CM | POA: Diagnosis not present

## 2016-12-24 DIAGNOSIS — Z992 Dependence on renal dialysis: Secondary | ICD-10-CM | POA: Diagnosis not present

## 2016-12-24 DIAGNOSIS — N2581 Secondary hyperparathyroidism of renal origin: Secondary | ICD-10-CM | POA: Diagnosis not present

## 2016-12-24 DIAGNOSIS — I82492 Acute embolism and thrombosis of other specified deep vein of left lower extremity: Secondary | ICD-10-CM | POA: Diagnosis not present

## 2016-12-24 DIAGNOSIS — E1122 Type 2 diabetes mellitus with diabetic chronic kidney disease: Secondary | ICD-10-CM | POA: Diagnosis not present

## 2016-12-24 DIAGNOSIS — N186 End stage renal disease: Secondary | ICD-10-CM | POA: Diagnosis not present

## 2016-12-24 LAB — POCT INR: INR: 2.69

## 2016-12-26 DIAGNOSIS — Z992 Dependence on renal dialysis: Secondary | ICD-10-CM | POA: Diagnosis not present

## 2016-12-26 DIAGNOSIS — D631 Anemia in chronic kidney disease: Secondary | ICD-10-CM | POA: Diagnosis not present

## 2016-12-26 DIAGNOSIS — N186 End stage renal disease: Secondary | ICD-10-CM | POA: Diagnosis not present

## 2016-12-26 DIAGNOSIS — E1122 Type 2 diabetes mellitus with diabetic chronic kidney disease: Secondary | ICD-10-CM | POA: Diagnosis not present

## 2016-12-26 DIAGNOSIS — N2581 Secondary hyperparathyroidism of renal origin: Secondary | ICD-10-CM | POA: Diagnosis not present

## 2016-12-29 ENCOUNTER — Ambulatory Visit (INDEPENDENT_AMBULATORY_CARE_PROVIDER_SITE_OTHER): Payer: Medicare Other | Admitting: General Practice

## 2016-12-29 DIAGNOSIS — N186 End stage renal disease: Secondary | ICD-10-CM | POA: Diagnosis not present

## 2016-12-29 DIAGNOSIS — N2581 Secondary hyperparathyroidism of renal origin: Secondary | ICD-10-CM | POA: Diagnosis not present

## 2016-12-29 DIAGNOSIS — E1122 Type 2 diabetes mellitus with diabetic chronic kidney disease: Secondary | ICD-10-CM | POA: Diagnosis not present

## 2016-12-29 DIAGNOSIS — Z992 Dependence on renal dialysis: Secondary | ICD-10-CM | POA: Diagnosis not present

## 2016-12-29 DIAGNOSIS — D631 Anemia in chronic kidney disease: Secondary | ICD-10-CM | POA: Diagnosis not present

## 2016-12-29 NOTE — Progress Notes (Signed)
I have reviewed and agree with the plan. 

## 2016-12-29 NOTE — Patient Instructions (Signed)
Pre visit review using our clinic review tool, if applicable. No additional management support is needed unless otherwise documented below in the visit note. 

## 2016-12-31 DIAGNOSIS — I82492 Acute embolism and thrombosis of other specified deep vein of left lower extremity: Secondary | ICD-10-CM | POA: Diagnosis not present

## 2016-12-31 DIAGNOSIS — Z992 Dependence on renal dialysis: Secondary | ICD-10-CM | POA: Diagnosis not present

## 2016-12-31 DIAGNOSIS — E1122 Type 2 diabetes mellitus with diabetic chronic kidney disease: Secondary | ICD-10-CM | POA: Diagnosis not present

## 2016-12-31 DIAGNOSIS — D631 Anemia in chronic kidney disease: Secondary | ICD-10-CM | POA: Diagnosis not present

## 2016-12-31 DIAGNOSIS — N2581 Secondary hyperparathyroidism of renal origin: Secondary | ICD-10-CM | POA: Diagnosis not present

## 2016-12-31 DIAGNOSIS — N186 End stage renal disease: Secondary | ICD-10-CM | POA: Diagnosis not present

## 2016-12-31 LAB — PROTIME-INR: INR: 2.3 — AB (ref <=1.1)

## 2017-01-02 DIAGNOSIS — E1122 Type 2 diabetes mellitus with diabetic chronic kidney disease: Secondary | ICD-10-CM | POA: Diagnosis not present

## 2017-01-02 DIAGNOSIS — N186 End stage renal disease: Secondary | ICD-10-CM | POA: Diagnosis not present

## 2017-01-02 DIAGNOSIS — Z992 Dependence on renal dialysis: Secondary | ICD-10-CM | POA: Diagnosis not present

## 2017-01-02 DIAGNOSIS — N2581 Secondary hyperparathyroidism of renal origin: Secondary | ICD-10-CM | POA: Diagnosis not present

## 2017-01-02 DIAGNOSIS — D631 Anemia in chronic kidney disease: Secondary | ICD-10-CM | POA: Diagnosis not present

## 2017-01-04 ENCOUNTER — Other Ambulatory Visit: Payer: Self-pay

## 2017-01-05 ENCOUNTER — Ambulatory Visit (INDEPENDENT_AMBULATORY_CARE_PROVIDER_SITE_OTHER): Payer: Medicare Other | Admitting: General Practice

## 2017-01-05 DIAGNOSIS — E1122 Type 2 diabetes mellitus with diabetic chronic kidney disease: Secondary | ICD-10-CM | POA: Diagnosis not present

## 2017-01-05 DIAGNOSIS — D631 Anemia in chronic kidney disease: Secondary | ICD-10-CM | POA: Diagnosis not present

## 2017-01-05 DIAGNOSIS — Z992 Dependence on renal dialysis: Secondary | ICD-10-CM | POA: Diagnosis not present

## 2017-01-05 DIAGNOSIS — N2581 Secondary hyperparathyroidism of renal origin: Secondary | ICD-10-CM | POA: Diagnosis not present

## 2017-01-05 DIAGNOSIS — N186 End stage renal disease: Secondary | ICD-10-CM | POA: Diagnosis not present

## 2017-01-05 NOTE — Patient Instructions (Signed)
Pre visit review using our clinic review tool, if applicable. No additional management support is needed unless otherwise documented below in the visit note. 

## 2017-01-05 NOTE — Progress Notes (Signed)
I have reviewed and agree with the plan. 

## 2017-01-07 DIAGNOSIS — E1122 Type 2 diabetes mellitus with diabetic chronic kidney disease: Secondary | ICD-10-CM | POA: Diagnosis not present

## 2017-01-07 DIAGNOSIS — N186 End stage renal disease: Secondary | ICD-10-CM | POA: Diagnosis not present

## 2017-01-07 DIAGNOSIS — Z992 Dependence on renal dialysis: Secondary | ICD-10-CM | POA: Diagnosis not present

## 2017-01-07 DIAGNOSIS — D631 Anemia in chronic kidney disease: Secondary | ICD-10-CM | POA: Diagnosis not present

## 2017-01-07 DIAGNOSIS — M109 Gout, unspecified: Secondary | ICD-10-CM | POA: Diagnosis not present

## 2017-01-07 DIAGNOSIS — N2581 Secondary hyperparathyroidism of renal origin: Secondary | ICD-10-CM | POA: Diagnosis not present

## 2017-01-07 DIAGNOSIS — I82492 Acute embolism and thrombosis of other specified deep vein of left lower extremity: Secondary | ICD-10-CM | POA: Diagnosis not present

## 2017-01-08 ENCOUNTER — Ambulatory Visit: Payer: Self-pay | Admitting: Endocrinology

## 2017-01-09 DIAGNOSIS — D631 Anemia in chronic kidney disease: Secondary | ICD-10-CM | POA: Diagnosis not present

## 2017-01-09 DIAGNOSIS — N2581 Secondary hyperparathyroidism of renal origin: Secondary | ICD-10-CM | POA: Diagnosis not present

## 2017-01-09 DIAGNOSIS — N186 End stage renal disease: Secondary | ICD-10-CM | POA: Diagnosis not present

## 2017-01-09 DIAGNOSIS — Z992 Dependence on renal dialysis: Secondary | ICD-10-CM | POA: Diagnosis not present

## 2017-01-09 DIAGNOSIS — E1122 Type 2 diabetes mellitus with diabetic chronic kidney disease: Secondary | ICD-10-CM | POA: Diagnosis not present

## 2017-01-12 DIAGNOSIS — N186 End stage renal disease: Secondary | ICD-10-CM | POA: Diagnosis not present

## 2017-01-12 DIAGNOSIS — E1129 Type 2 diabetes mellitus with other diabetic kidney complication: Secondary | ICD-10-CM | POA: Diagnosis not present

## 2017-01-12 DIAGNOSIS — N2581 Secondary hyperparathyroidism of renal origin: Secondary | ICD-10-CM | POA: Diagnosis not present

## 2017-01-12 DIAGNOSIS — R509 Fever, unspecified: Secondary | ICD-10-CM | POA: Insufficient documentation

## 2017-01-12 DIAGNOSIS — D631 Anemia in chronic kidney disease: Secondary | ICD-10-CM | POA: Diagnosis not present

## 2017-01-12 DIAGNOSIS — E1122 Type 2 diabetes mellitus with diabetic chronic kidney disease: Secondary | ICD-10-CM | POA: Diagnosis not present

## 2017-01-14 DIAGNOSIS — R509 Fever, unspecified: Secondary | ICD-10-CM | POA: Diagnosis not present

## 2017-01-14 DIAGNOSIS — N2581 Secondary hyperparathyroidism of renal origin: Secondary | ICD-10-CM | POA: Diagnosis not present

## 2017-01-14 DIAGNOSIS — I82492 Acute embolism and thrombosis of other specified deep vein of left lower extremity: Secondary | ICD-10-CM | POA: Diagnosis not present

## 2017-01-14 DIAGNOSIS — E1122 Type 2 diabetes mellitus with diabetic chronic kidney disease: Secondary | ICD-10-CM | POA: Diagnosis not present

## 2017-01-14 DIAGNOSIS — E1129 Type 2 diabetes mellitus with other diabetic kidney complication: Secondary | ICD-10-CM | POA: Diagnosis not present

## 2017-01-14 DIAGNOSIS — D631 Anemia in chronic kidney disease: Secondary | ICD-10-CM | POA: Diagnosis not present

## 2017-01-14 DIAGNOSIS — N186 End stage renal disease: Secondary | ICD-10-CM | POA: Diagnosis not present

## 2017-01-16 DIAGNOSIS — E1122 Type 2 diabetes mellitus with diabetic chronic kidney disease: Secondary | ICD-10-CM | POA: Diagnosis not present

## 2017-01-16 DIAGNOSIS — D631 Anemia in chronic kidney disease: Secondary | ICD-10-CM | POA: Diagnosis not present

## 2017-01-16 DIAGNOSIS — N186 End stage renal disease: Secondary | ICD-10-CM | POA: Diagnosis not present

## 2017-01-16 DIAGNOSIS — N2581 Secondary hyperparathyroidism of renal origin: Secondary | ICD-10-CM | POA: Diagnosis not present

## 2017-01-16 DIAGNOSIS — E1129 Type 2 diabetes mellitus with other diabetic kidney complication: Secondary | ICD-10-CM | POA: Diagnosis not present

## 2017-01-16 DIAGNOSIS — R509 Fever, unspecified: Secondary | ICD-10-CM | POA: Diagnosis not present

## 2017-01-19 DIAGNOSIS — N186 End stage renal disease: Secondary | ICD-10-CM | POA: Diagnosis not present

## 2017-01-19 DIAGNOSIS — E1122 Type 2 diabetes mellitus with diabetic chronic kidney disease: Secondary | ICD-10-CM | POA: Diagnosis not present

## 2017-01-19 DIAGNOSIS — N2581 Secondary hyperparathyroidism of renal origin: Secondary | ICD-10-CM | POA: Diagnosis not present

## 2017-01-19 DIAGNOSIS — D631 Anemia in chronic kidney disease: Secondary | ICD-10-CM | POA: Diagnosis not present

## 2017-01-19 DIAGNOSIS — R509 Fever, unspecified: Secondary | ICD-10-CM | POA: Diagnosis not present

## 2017-01-19 DIAGNOSIS — E1129 Type 2 diabetes mellitus with other diabetic kidney complication: Secondary | ICD-10-CM | POA: Diagnosis not present

## 2017-01-21 DIAGNOSIS — N2581 Secondary hyperparathyroidism of renal origin: Secondary | ICD-10-CM | POA: Diagnosis not present

## 2017-01-21 DIAGNOSIS — N186 End stage renal disease: Secondary | ICD-10-CM | POA: Diagnosis not present

## 2017-01-21 DIAGNOSIS — E1129 Type 2 diabetes mellitus with other diabetic kidney complication: Secondary | ICD-10-CM | POA: Diagnosis not present

## 2017-01-21 DIAGNOSIS — E1122 Type 2 diabetes mellitus with diabetic chronic kidney disease: Secondary | ICD-10-CM | POA: Diagnosis not present

## 2017-01-21 DIAGNOSIS — R509 Fever, unspecified: Secondary | ICD-10-CM | POA: Diagnosis not present

## 2017-01-21 DIAGNOSIS — D631 Anemia in chronic kidney disease: Secondary | ICD-10-CM | POA: Diagnosis not present

## 2017-01-22 ENCOUNTER — Other Ambulatory Visit (INDEPENDENT_AMBULATORY_CARE_PROVIDER_SITE_OTHER): Payer: Medicare Other

## 2017-01-22 DIAGNOSIS — Z794 Long term (current) use of insulin: Secondary | ICD-10-CM | POA: Diagnosis not present

## 2017-01-22 DIAGNOSIS — E1165 Type 2 diabetes mellitus with hyperglycemia: Secondary | ICD-10-CM

## 2017-01-22 LAB — GLUCOSE, RANDOM: Glucose, Bld: 244 mg/dL — ABNORMAL HIGH (ref 70–99)

## 2017-01-22 LAB — HEMOGLOBIN A1C: Hgb A1c MFr Bld: 7.3 % — ABNORMAL HIGH (ref 4.6–6.5)

## 2017-01-23 DIAGNOSIS — R509 Fever, unspecified: Secondary | ICD-10-CM | POA: Diagnosis not present

## 2017-01-23 DIAGNOSIS — E1122 Type 2 diabetes mellitus with diabetic chronic kidney disease: Secondary | ICD-10-CM | POA: Diagnosis not present

## 2017-01-23 DIAGNOSIS — D631 Anemia in chronic kidney disease: Secondary | ICD-10-CM | POA: Diagnosis not present

## 2017-01-23 DIAGNOSIS — N186 End stage renal disease: Secondary | ICD-10-CM | POA: Diagnosis not present

## 2017-01-23 DIAGNOSIS — E1129 Type 2 diabetes mellitus with other diabetic kidney complication: Secondary | ICD-10-CM | POA: Diagnosis not present

## 2017-01-23 DIAGNOSIS — I82492 Acute embolism and thrombosis of other specified deep vein of left lower extremity: Secondary | ICD-10-CM | POA: Diagnosis not present

## 2017-01-23 DIAGNOSIS — N2581 Secondary hyperparathyroidism of renal origin: Secondary | ICD-10-CM | POA: Diagnosis not present

## 2017-01-26 DIAGNOSIS — D631 Anemia in chronic kidney disease: Secondary | ICD-10-CM | POA: Diagnosis not present

## 2017-01-26 DIAGNOSIS — R509 Fever, unspecified: Secondary | ICD-10-CM | POA: Diagnosis not present

## 2017-01-26 DIAGNOSIS — N2581 Secondary hyperparathyroidism of renal origin: Secondary | ICD-10-CM | POA: Diagnosis not present

## 2017-01-26 DIAGNOSIS — N186 End stage renal disease: Secondary | ICD-10-CM | POA: Diagnosis not present

## 2017-01-26 DIAGNOSIS — E1122 Type 2 diabetes mellitus with diabetic chronic kidney disease: Secondary | ICD-10-CM | POA: Diagnosis not present

## 2017-01-26 DIAGNOSIS — E1129 Type 2 diabetes mellitus with other diabetic kidney complication: Secondary | ICD-10-CM | POA: Diagnosis not present

## 2017-01-27 ENCOUNTER — Ambulatory Visit: Payer: Self-pay | Admitting: Endocrinology

## 2017-01-28 DIAGNOSIS — E1129 Type 2 diabetes mellitus with other diabetic kidney complication: Secondary | ICD-10-CM | POA: Diagnosis not present

## 2017-01-28 DIAGNOSIS — N186 End stage renal disease: Secondary | ICD-10-CM | POA: Diagnosis not present

## 2017-01-28 DIAGNOSIS — R509 Fever, unspecified: Secondary | ICD-10-CM | POA: Diagnosis not present

## 2017-01-28 DIAGNOSIS — E1122 Type 2 diabetes mellitus with diabetic chronic kidney disease: Secondary | ICD-10-CM | POA: Diagnosis not present

## 2017-01-28 DIAGNOSIS — I82492 Acute embolism and thrombosis of other specified deep vein of left lower extremity: Secondary | ICD-10-CM | POA: Diagnosis not present

## 2017-01-28 DIAGNOSIS — D631 Anemia in chronic kidney disease: Secondary | ICD-10-CM | POA: Diagnosis not present

## 2017-01-28 DIAGNOSIS — N2581 Secondary hyperparathyroidism of renal origin: Secondary | ICD-10-CM | POA: Diagnosis not present

## 2017-01-30 DIAGNOSIS — R509 Fever, unspecified: Secondary | ICD-10-CM | POA: Diagnosis not present

## 2017-01-30 DIAGNOSIS — N2581 Secondary hyperparathyroidism of renal origin: Secondary | ICD-10-CM | POA: Diagnosis not present

## 2017-01-30 DIAGNOSIS — D631 Anemia in chronic kidney disease: Secondary | ICD-10-CM | POA: Diagnosis not present

## 2017-01-30 DIAGNOSIS — E1122 Type 2 diabetes mellitus with diabetic chronic kidney disease: Secondary | ICD-10-CM | POA: Diagnosis not present

## 2017-01-30 DIAGNOSIS — N186 End stage renal disease: Secondary | ICD-10-CM | POA: Diagnosis not present

## 2017-01-30 DIAGNOSIS — E1129 Type 2 diabetes mellitus with other diabetic kidney complication: Secondary | ICD-10-CM | POA: Diagnosis not present

## 2017-02-02 DIAGNOSIS — D631 Anemia in chronic kidney disease: Secondary | ICD-10-CM | POA: Diagnosis not present

## 2017-02-02 DIAGNOSIS — N186 End stage renal disease: Secondary | ICD-10-CM | POA: Diagnosis not present

## 2017-02-02 DIAGNOSIS — E1122 Type 2 diabetes mellitus with diabetic chronic kidney disease: Secondary | ICD-10-CM | POA: Diagnosis not present

## 2017-02-02 DIAGNOSIS — N2581 Secondary hyperparathyroidism of renal origin: Secondary | ICD-10-CM | POA: Diagnosis not present

## 2017-02-02 DIAGNOSIS — R509 Fever, unspecified: Secondary | ICD-10-CM | POA: Diagnosis not present

## 2017-02-02 DIAGNOSIS — E1129 Type 2 diabetes mellitus with other diabetic kidney complication: Secondary | ICD-10-CM | POA: Diagnosis not present

## 2017-02-02 DIAGNOSIS — I82492 Acute embolism and thrombosis of other specified deep vein of left lower extremity: Secondary | ICD-10-CM | POA: Diagnosis not present

## 2017-02-02 LAB — PROTIME-INR: INR: 2.2 — AB (ref <=1.1)

## 2017-02-04 DIAGNOSIS — N2581 Secondary hyperparathyroidism of renal origin: Secondary | ICD-10-CM | POA: Diagnosis not present

## 2017-02-04 DIAGNOSIS — N186 End stage renal disease: Secondary | ICD-10-CM | POA: Diagnosis not present

## 2017-02-04 DIAGNOSIS — D631 Anemia in chronic kidney disease: Secondary | ICD-10-CM | POA: Diagnosis not present

## 2017-02-04 DIAGNOSIS — E1122 Type 2 diabetes mellitus with diabetic chronic kidney disease: Secondary | ICD-10-CM | POA: Diagnosis not present

## 2017-02-04 DIAGNOSIS — R509 Fever, unspecified: Secondary | ICD-10-CM | POA: Diagnosis not present

## 2017-02-04 DIAGNOSIS — M109 Gout, unspecified: Secondary | ICD-10-CM | POA: Diagnosis not present

## 2017-02-04 DIAGNOSIS — I82492 Acute embolism and thrombosis of other specified deep vein of left lower extremity: Secondary | ICD-10-CM | POA: Diagnosis not present

## 2017-02-04 DIAGNOSIS — E1129 Type 2 diabetes mellitus with other diabetic kidney complication: Secondary | ICD-10-CM | POA: Diagnosis not present

## 2017-02-04 LAB — PROTIME-INR: INR: 1.9 — AB (ref <=1.1)

## 2017-02-08 DIAGNOSIS — R509 Fever, unspecified: Secondary | ICD-10-CM | POA: Diagnosis not present

## 2017-02-08 DIAGNOSIS — N186 End stage renal disease: Secondary | ICD-10-CM | POA: Diagnosis not present

## 2017-02-08 DIAGNOSIS — E1129 Type 2 diabetes mellitus with other diabetic kidney complication: Secondary | ICD-10-CM | POA: Diagnosis not present

## 2017-02-08 DIAGNOSIS — Z992 Dependence on renal dialysis: Secondary | ICD-10-CM | POA: Diagnosis not present

## 2017-02-08 DIAGNOSIS — E1122 Type 2 diabetes mellitus with diabetic chronic kidney disease: Secondary | ICD-10-CM | POA: Diagnosis not present

## 2017-02-08 DIAGNOSIS — N2581 Secondary hyperparathyroidism of renal origin: Secondary | ICD-10-CM | POA: Diagnosis not present

## 2017-02-08 DIAGNOSIS — D631 Anemia in chronic kidney disease: Secondary | ICD-10-CM | POA: Diagnosis not present

## 2017-02-09 ENCOUNTER — Ambulatory Visit: Payer: Self-pay | Admitting: General Practice

## 2017-02-09 ENCOUNTER — Ambulatory Visit (INDEPENDENT_AMBULATORY_CARE_PROVIDER_SITE_OTHER): Payer: Medicare Other | Admitting: General Practice

## 2017-02-09 DIAGNOSIS — E1122 Type 2 diabetes mellitus with diabetic chronic kidney disease: Secondary | ICD-10-CM | POA: Diagnosis not present

## 2017-02-09 DIAGNOSIS — N2581 Secondary hyperparathyroidism of renal origin: Secondary | ICD-10-CM | POA: Diagnosis not present

## 2017-02-09 DIAGNOSIS — D631 Anemia in chronic kidney disease: Secondary | ICD-10-CM | POA: Diagnosis not present

## 2017-02-09 DIAGNOSIS — E1129 Type 2 diabetes mellitus with other diabetic kidney complication: Secondary | ICD-10-CM | POA: Diagnosis not present

## 2017-02-09 DIAGNOSIS — N186 End stage renal disease: Secondary | ICD-10-CM | POA: Diagnosis not present

## 2017-02-09 NOTE — Progress Notes (Signed)
I have reviewed and agree with the plan. 

## 2017-02-09 NOTE — Patient Instructions (Signed)
Pre visit review using our clinic review tool, if applicable. No additional management support is needed unless otherwise documented below in the visit note. 

## 2017-02-11 DIAGNOSIS — D631 Anemia in chronic kidney disease: Secondary | ICD-10-CM | POA: Diagnosis not present

## 2017-02-11 DIAGNOSIS — E1122 Type 2 diabetes mellitus with diabetic chronic kidney disease: Secondary | ICD-10-CM | POA: Diagnosis not present

## 2017-02-11 DIAGNOSIS — I82492 Acute embolism and thrombosis of other specified deep vein of left lower extremity: Secondary | ICD-10-CM | POA: Diagnosis not present

## 2017-02-11 DIAGNOSIS — E1129 Type 2 diabetes mellitus with other diabetic kidney complication: Secondary | ICD-10-CM | POA: Diagnosis not present

## 2017-02-11 DIAGNOSIS — N186 End stage renal disease: Secondary | ICD-10-CM | POA: Diagnosis not present

## 2017-02-11 DIAGNOSIS — N2581 Secondary hyperparathyroidism of renal origin: Secondary | ICD-10-CM | POA: Diagnosis not present

## 2017-02-11 LAB — PROTIME-INR: INR: 2.2 — AB (ref <=1.1)

## 2017-02-12 ENCOUNTER — Ambulatory Visit (INDEPENDENT_AMBULATORY_CARE_PROVIDER_SITE_OTHER): Payer: Medicare Other | Admitting: Endocrinology

## 2017-02-12 ENCOUNTER — Other Ambulatory Visit: Payer: Self-pay | Admitting: Internal Medicine

## 2017-02-12 ENCOUNTER — Encounter: Payer: Self-pay | Admitting: Endocrinology

## 2017-02-12 ENCOUNTER — Telehealth: Payer: Self-pay

## 2017-02-12 ENCOUNTER — Other Ambulatory Visit: Payer: Self-pay | Admitting: Endocrinology

## 2017-02-12 VITALS — BP 148/70 | HR 115 | Ht 72.0 in | Wt 279.6 lb

## 2017-02-12 DIAGNOSIS — R000 Tachycardia, unspecified: Secondary | ICD-10-CM | POA: Diagnosis not present

## 2017-02-12 DIAGNOSIS — E1165 Type 2 diabetes mellitus with hyperglycemia: Secondary | ICD-10-CM | POA: Diagnosis not present

## 2017-02-12 DIAGNOSIS — Z794 Long term (current) use of insulin: Secondary | ICD-10-CM | POA: Diagnosis not present

## 2017-02-12 MED ORDER — METOPROLOL SUCCINATE ER 25 MG PO TB24: 25.0000 mg | ORAL_TABLET | Freq: Every day | ORAL | 3 refills | Status: DC

## 2017-02-12 NOTE — Telephone Encounter (Signed)
Called patient to verify local pharmacy and he stated that he would like to use the [facility] at [address].

## 2017-02-12 NOTE — Patient Instructions (Addendum)
Humulin use 30 at lunch  Check blood sugars on waking up  Every 2 days  Also check blood sugars about 2 hours after a meal and do this after different meals by rotation  Recommended blood sugar levels on waking up is 90-130 and about 2 hours after meal is 130-160  Please bring your blood sugar monitor to each visit, thank you

## 2017-02-12 NOTE — Progress Notes (Signed)
Patient ID: [name] [name], male   DOB: 24-Feb-1966, 51 y.o.   MRN: [id number]           Reason for Appointment: Follow-up for Type 2 Diabetes  History of Present Illness:          Date of diagnosis of type 2 diabetes mellitus : 1990       Background history:  He has been taking insulin since about 1999, previously was on unknown oral agents He had good control with insulin initially but A1c was much higher in 2008 and subsequently has had required larger doses of insulin He has been on Lantus for the last few years along with Novolog,followed by an endocrinologist since 2008 His A1c was 6.3 in 04/2014   Prior to his consultation he was on a regimen of Novolog , 50 units 5 times a day with each meal and snack along with 90 Units 3 times a day of Lantus   Recent history:   INSULIN regimen is :U-500 insulin with KwikPen 25  before bfst, 25 before lunch and 30 before dinner, take 30 min before  Sliding scale use for high sugars: Sugar 150-199 take 2 units, 200-250= 4 units and over 250 take 6 units   TOUJEO 90 units twice daily  A1c is about the same at 7.3 in April 2018  Current blood sugar patterns and problems identified:  He did not bring his monitor today and difficult to assess his blood sugar patterns  He thinks he is checking his blood sugars 4 times a day   He has increased his insulin doses all around since he thinks his blood sugars were running higher since he was last seen in January  Previously was having occasional low sugars waking up but he has gone back up on the Toujeo by 10 units  Also he is taking somewhat more insulin at breakfast and lunch; however appears to be adjusting his insulin and mostly based on his pre-meal blood sugar  According to his history he is getting higher readings before suppertime more consistently but not as high at other times  He is asking for an insulin pump but not clear if he will qualify, currently doing fairly well with subcutaneous  injections and he has a very high insulin requirement also  Occasionally will get higher readings when he is eating out or eating with friends.  He still tries to take his insulin consistently before meals  Not having hypoglycemia   Compliance with the medical regimen: usually good Hypoglycemia: As above  Glucose monitoring:  done 0-2 times a day         Glucometer:    Aviva      Blood Glucose readings by recall:    PRE-MEAL Fasting Lunch Dinner Bedtime Overall  Glucose range: 87-147 138-160 180 150   Mean/median:          Self-care: The diet that the patient has been following is: tries to limit high-fat meals, usually eating  2-3 meals a day.     Typical meal intake: Breakfast is grits with pecans, olive oil. Dinner 6 pm               Dietician visit, most recent:2013               Exercise:  unable to do any, In wheelchair  Weight history:  Wt Readings from Last 3 Encounters:  02/12/17 279 lb 9.6 oz (126.8 kg)  11/29/16 271 lb 2.7 oz (123 kg)  11/11/16 275 lb 12 oz (125.1 kg)    Glycemic control:   Lab Results  Component Value Date   HGBA1C 7.3 (H) 01/22/2017   HGBA1C 7.1 (H) 08/28/2016   HGBA1C 7.2 (H) 11/22/2015   Lab Results  Component Value Date   MICROALBUR 232.9 (H) 06/19/2015   LDLCALC 67 08/28/2016   CREATININE 6.12 (H) 12/15/2016       Allergies as of 02/12/2017      Reactions   Pork-derived Products Anaphylaxis, Other (See Comments)   Fever also    Ace Inhibitors Cough   Hydrocodone Other (See Comments)   Hallucinations      Medication List       Accurate as of 02/12/17  1:32 PM. Always use your most recent med list.          accu-chek multiclix lancets Check blood sugar four times daily   calcium acetate 667 MG capsule Commonly known as:  PHOSLO Take 3 capsules (2,001 mg total) by mouth 3 (three) times daily with meals.   clonazePAM 0.5 MG tablet Commonly known as:  KLONOPIN TAKE 2 TABLETS AT BEDTIME   cloNIDine 0.3 MG  tablet Commonly known as:  CATAPRES TAKE 1 TABLET THREE TIMES DAILY   Febuxostat 80 MG Tabs Commonly known as:  ULORIC Take 1 tablet (80 mg total) by mouth daily.   glucose blood test strip Commonly known as:  ACCU-CHEK GUIDE Check blood sugar four times daily   ACCU-CHEK SMARTVIEW test strip Generic drug:  glucose blood USE FOUR TIMES DAILY AS DIRECTED TO CHECK BLOOD SUGAR   HUMULIN R U-500 KWIKPEN 500 UNIT/ML kwikpen Generic drug:  insulin regular human CONCENTRATED INJECT 15 UNITS AT BREAKFAST, 25 UNITS AT LUNCH AND 20 UNITS BEFORE SUPPER   hydrocortisone cream 1 % Apply to affected area 2 times daily   insulin aspart 100 UNIT/ML FlexPen Commonly known as:  NOVOLOG FLEXPEN Inject 6 Units into the skin 3 (three) times daily with meals.   Insulin Pen Needle 32G X 8 MM Misc Use as directed   BD PEN NEEDLE NANO U/F 32G X 4 MM Misc Generic drug:  Insulin Pen Needle USE  5  PER  DAY AS DIRECTED   labetalol 100 MG tablet Commonly known as:  NORMODYNE TAKE 1 TABLET THREE TIMES DAILY   omeprazole 20 MG capsule Commonly known as:  PRILOSEC Take 1 capsule (20 mg total) by mouth daily.   rosuvastatin 20 MG tablet Commonly known as:  CRESTOR Take 1 tablet (20 mg total) by mouth at bedtime.   tadalafil 5 MG tablet Commonly known as:  CIALIS Take 1 tablet (5 mg total) by mouth daily.   TOUJEO SOLOSTAR 300 UNIT/ML Sopn Generic drug:  Insulin Glargine INJECT  90 UNITS SUBCUTANEOUSLY TWICE DAILY   traMADol 50 MG tablet Commonly known as:  ULTRAM Take 1 tablet (50 mg total) by mouth every 8 (eight) hours as needed.   warfarin 5 MG tablet Commonly known as:  COUMADIN Take 1 (5 mg) tablet daily.       Allergies:  Allergies  Allergen Reactions  . Pork-Derived Products Anaphylaxis and Other (See Comments)    Fever also   . Ace Inhibitors Cough  . Hydrocodone Other (See Comments)    Hallucinations    Past Medical History:  Diagnosis Date  . Allergic rhinitis    . Anemia   . Aortic insufficiency    a. Echo 7/16:  Mod LVH, EF 50-55%, mild to mod AI, severe LAE, PASP  57 mmHg (LV ID end diastolic 70.4 mm);  b. Echo 2/17:  Mod LVH, EF 50-55%, mod AI, MAC, mild MR, mod LAE, mild to mod TR, PASP 63 mmHg  . Bilateral carpal tunnel syndrome   . CHF (congestive heart failure) ([place])   . Chronic kidney disease    Kidney Transplant 2009 09/26/15- transplanated kidney failing  . Chronic kidney disease (CKD), stage IV (severe) ([place])   . Chronic renal allograft nephropathy   . Claustrophobia   . Complication of anesthesia    "they said something at [facility] it was affected by sleep aqpnea."  . COPD (chronic obstructive pulmonary disease) ([place])   . Diabetes mellitus    Type II  . Diabetic peripheral neuropathy ([place])   . Diabetic retinopathy ([place])   . Diminished eyesight   . Discitis of lumbar region    resolved  . DVT (deep venous thrombosis) ([place]) 10/07/2015   LLE  . Dyslipidemia   . Endocarditis   . Endocarditis 11/28/14  . Gait disorder   . Gastroparesis   . GERD (gastroesophageal reflux disease)   . Gout   . Hearing difficulty   . Hyperlipidemia   . Hypertension   . Incomplete bladder emptying   . Leg pain 02/05/11   with walking  . Morbid obesity ([place])   . Nervousness(799.21)   . Obstructive sleep apnea    not using CPAP- lost 100lbs  . Osteomyelitis ([place])   . Posttraumatic stress disorder   . Renal transplant, status post 04/05/2014  . Rotator cuff disorder   . Secondary hyperparathyroidism ([place])   . Sepsis ([place])    Postive blood cultures -   . Shortness of breath    when lying flat  . Urethral stricture   . Vitamin D deficiency   . Weight gain     Past Surgical History:  Procedure Laterality Date  . ARTERIOVENOUS GRAFT PLACEMENT Bilateral "several"  . AV FISTULA PLACEMENT Bilateral 2015  . BASCILIC VEIN TRANSPOSITION Right 09/27/2015  . [place] Right 09/27/2015   Procedure: BASILIC VEIN TRANSPOSITION  right;  Surgeon: Mal Misty, MD;  Location: [facility];  Service: Vascular;  Laterality: Right;  . CATARACT EXTRACTION W/ INTRAOCULAR LENS  IMPLANT, BILATERAL Bilateral   . CYSTOSCOPY W/ INTERNAL URETHROTOMY  09/2014  . KIDNEY TRANSPLANT  2009    Family History  Problem Relation Age of Onset  . Hypertension Mother   . Diabetes Father     Social History:  reports that he has never smoked. He has never used smokeless tobacco. He reports that he does not drink alcohol or use drugs.    Review of Systems    ROS  He has been noticed to have rapid heart rate and was seen in the ER also but no treatment has been started Not clear if he has had any thyroid levels done since last year  Lipid history: He is on 20 mg Crestor With the following results    Lab Results  Component Value Date   CHOL 124 08/28/2016   HDL 31.80 (L) 08/28/2016   LDLCALC 67 08/28/2016   TRIG 127.0 08/28/2016   CHOLHDL 4 08/28/2016            Hypertension: His blood pressure is Variable, he has been told by his nephrologist to stop his medications because of tendency lower sugars during dialysis Previously on 0.3 mg dose of clonidine along with labetalol and is followed by nephrologist   RENAL: He has had  a transplant in 2009 and  he is on dialysis  Neurological: Has has had  decreased sensation in his feet, no burning Previously followed by neurologist, currently not on treatment  He has had marked decrease in his balance and also some weakness in his legs, using a power scooter  Last foot exam was in 02/2017: Absent monofilament sensation in his feet and toes and extending up to the middle of the lower leg on the left and up to the ankle on the right  LABS:  Anti-coag visit on 02/09/2017  Component Date Value Ref Range Status  . INR 02/04/2017 1.9* 0.9 - 1.1 Final  Anti-coag visit on 02/09/2017  Component Date Value Ref Range Status  . INR 02/02/2017 2.2* 0.9 - 1.1 Final    Physical  Examination:  BP (!) 148/70   Pulse (!) 115   Ht 6' (1.829 m)   Wt 279 lb 9.6 oz (126.8 kg)   SpO2 94%   BMI 37.92 kg/m   Heart rate is regular, about 108 Thyroid not palpable Reflexes appear normal No tremor, skin not unusually warm or moist    ASSESSMENT:  Diabetes type 2, long-standing with obesity See history of present illness for detailed discussion of his current management, blood sugar patterns and problems identified.  His blood sugars overall are fairly good with A1c 7.3 Blood sugar patterns as discussed above but difficult to know if he is getting consistent readings because of not having his meter for download today He was also recommended the freestyle Libre sensor but he did not know where to get this from As discussed above may not be a candidate for insulin pump because of his high insulin requirement and not clear if he has a 0 C-peptide  NEUROPATHY: Advised him to check his feet daily  LIPIDS: Needs follow-up on the next visit  TACHYCARDIA: He will have thyroid functions checked, note given to have this drawn next Thursday at dialysis Meanwhile will have him start metoprolol ER  HYPERTENSION: Followed by nephrologist, blood pressure is relatively high today, currently off medications  PLAN:   He will bring his meter for download on each visit  Given information on the supplier to get his freestyle Libre sensor and discussed how this would be used  Discussed adjusting the Humulin R if the blood sugar at the next meal is higher or lower  Also based on what he is planning to eat he will adjust the dose of the Humulin R by 5 units up or down  Meanwhile he will try 30 units at lunch  No change in Toujeo unless fasting readings are consistently abnormal  Needs follow-up with A1c in 2 months  Patient Instructions  Humulin use 30 at lunch  Check blood sugars on waking up  Every 2 days  Also check blood sugars about 2 hours after a meal and do this  after different meals by rotation  Recommended blood sugar levels on waking up is 90-130 and about 2 hours after meal is 130-160  Please bring your blood sugar monitor to each visit, thank you    Counseling time on subjects discussed above is over 50% of today's 25 minute visit   Apollo Timothy 02/12/2017, 1:32 PM   Note: This office note was prepared with Estate agent. Any transcriptional errors that result from this process are unintentional.

## 2017-02-13 DIAGNOSIS — N2581 Secondary hyperparathyroidism of renal origin: Secondary | ICD-10-CM | POA: Diagnosis not present

## 2017-02-13 DIAGNOSIS — D631 Anemia in chronic kidney disease: Secondary | ICD-10-CM | POA: Diagnosis not present

## 2017-02-13 DIAGNOSIS — E1122 Type 2 diabetes mellitus with diabetic chronic kidney disease: Secondary | ICD-10-CM | POA: Diagnosis not present

## 2017-02-13 DIAGNOSIS — E1129 Type 2 diabetes mellitus with other diabetic kidney complication: Secondary | ICD-10-CM | POA: Diagnosis not present

## 2017-02-13 DIAGNOSIS — N186 End stage renal disease: Secondary | ICD-10-CM | POA: Diagnosis not present

## 2017-02-15 ENCOUNTER — Other Ambulatory Visit: Payer: Self-pay | Admitting: General Practice

## 2017-02-15 MED ORDER — WARFARIN SODIUM 5 MG PO TABS: ORAL_TABLET | ORAL | 1 refills | Status: DC

## 2017-02-16 ENCOUNTER — Ambulatory Visit (INDEPENDENT_AMBULATORY_CARE_PROVIDER_SITE_OTHER): Payer: Medicare Other | Admitting: General Practice

## 2017-02-16 DIAGNOSIS — D631 Anemia in chronic kidney disease: Secondary | ICD-10-CM | POA: Diagnosis not present

## 2017-02-16 DIAGNOSIS — N2581 Secondary hyperparathyroidism of renal origin: Secondary | ICD-10-CM | POA: Diagnosis not present

## 2017-02-16 DIAGNOSIS — N186 End stage renal disease: Secondary | ICD-10-CM | POA: Diagnosis not present

## 2017-02-16 DIAGNOSIS — E1122 Type 2 diabetes mellitus with diabetic chronic kidney disease: Secondary | ICD-10-CM | POA: Diagnosis not present

## 2017-02-16 DIAGNOSIS — E1129 Type 2 diabetes mellitus with other diabetic kidney complication: Secondary | ICD-10-CM | POA: Diagnosis not present

## 2017-02-16 NOTE — Patient Instructions (Signed)
Pre visit review using our clinic review tool, if applicable. No additional management support is needed unless otherwise documented below in the visit note. 

## 2017-02-16 NOTE — Progress Notes (Signed)
I have reviewed and agree with the plan. 

## 2017-02-18 DIAGNOSIS — E1129 Type 2 diabetes mellitus with other diabetic kidney complication: Secondary | ICD-10-CM | POA: Diagnosis not present

## 2017-02-18 DIAGNOSIS — N2581 Secondary hyperparathyroidism of renal origin: Secondary | ICD-10-CM | POA: Diagnosis not present

## 2017-02-18 DIAGNOSIS — D631 Anemia in chronic kidney disease: Secondary | ICD-10-CM | POA: Diagnosis not present

## 2017-02-18 DIAGNOSIS — I82492 Acute embolism and thrombosis of other specified deep vein of left lower extremity: Secondary | ICD-10-CM | POA: Diagnosis not present

## 2017-02-18 DIAGNOSIS — N186 End stage renal disease: Secondary | ICD-10-CM | POA: Diagnosis not present

## 2017-02-18 DIAGNOSIS — E1122 Type 2 diabetes mellitus with diabetic chronic kidney disease: Secondary | ICD-10-CM | POA: Diagnosis not present

## 2017-02-19 ENCOUNTER — Encounter: Payer: Self-pay | Admitting: Cardiovascular Disease

## 2017-02-19 ENCOUNTER — Ambulatory Visit (INDEPENDENT_AMBULATORY_CARE_PROVIDER_SITE_OTHER): Payer: Medicare Other | Admitting: Cardiovascular Disease

## 2017-02-19 VITALS — BP 130/70 | HR 95 | Ht 72.0 in | Wt 278.8 lb

## 2017-02-19 DIAGNOSIS — E7800 Pure hypercholesterolemia, unspecified: Secondary | ICD-10-CM | POA: Diagnosis not present

## 2017-02-19 DIAGNOSIS — I351 Nonrheumatic aortic (valve) insufficiency: Secondary | ICD-10-CM

## 2017-02-19 DIAGNOSIS — I110 Hypertensive heart disease with heart failure: Secondary | ICD-10-CM

## 2017-02-19 DIAGNOSIS — I471 Supraventricular tachycardia: Secondary | ICD-10-CM | POA: Diagnosis not present

## 2017-02-19 DIAGNOSIS — I5032 Chronic diastolic (congestive) heart failure: Secondary | ICD-10-CM

## 2017-02-19 NOTE — Progress Notes (Signed)
Chief Complaint  Patient presents with  . Follow-up    aortic insufficiency     History of Present Illness: 51 yo [name] male with history of aortic valve insufficiency, DM, HTN, HLD, morbid obesity, ESRD on HD, prior endocarditis and DVT who is here today for cardiac follow up. He is s/p failed kidney transpant in 2009 at [facility] in [place]. I saw him in 2011 for evaluation of chest pain. He has chronic leg weakness and swelling and is mostly immobile. Echo in 2011 showed moderate LVH with normal LV systolic function. Since then he has had evidence of aortic valve insufficiency. He was admitted to [facility] and then [facility] in 2016 with discitis and he had evidence of aortic valve endocarditis. The plan was for antibiotics and then surgery but he did not tolerate his cardiac cath and no surgery was performed. He missed all f/u appts at [facility] with the cardiology division. He did see ID at [facility] and blood cultures normalized. Most recent echo March 2018 with normal LV systolic function, moderate LVH, moderate AI. Stress test in 2011 with no ischemia.  DVT in December 2016.  He was seen in the ED March 2018 after HD with palpitations. EKG with possible atrial flutter. He was sent home after hydration. No further palpitations.   He is here today for follow up. The patient denies any chest pain, dyspnea, lower extremity edema, orthopnea, PND, dizziness, near syncope or syncope. Rare palpitations.   Primary Care Physician: [facility] Nephrology: Dr. Jamal Maes   Past Medical History:  Diagnosis Date  . Allergic rhinitis   . Anemia   . Aortic insufficiency    a. Echo 7/16:  Mod LVH, EF 50-55%, mild to mod AI, severe LAE, PASP 57 mmHg (LV ID end diastolic 63.8 mm);  b. Echo 2/17:  Mod LVH, EF 50-55%, mod AI, MAC, mild MR, mod LAE, mild to mod TR, PASP 63 mmHg  . Bilateral carpal tunnel syndrome   . CHF (congestive heart failure) ([place])   . Chronic kidney disease    Kidney  Transplant 2009 09/26/15- transplanated kidney failing  . Chronic kidney disease (CKD), stage IV (severe) ([place])   . Chronic renal allograft nephropathy   . Claustrophobia   . Complication of anesthesia    "they said something at [facility] it was affected by sleep aqpnea."  . COPD (chronic obstructive pulmonary disease) ([place])   . Diabetes mellitus    Type II  . Diabetic peripheral neuropathy ([place])   . Diabetic retinopathy ([place])   . Diminished eyesight   . Discitis of lumbar region    resolved  . DVT (deep venous thrombosis) ([place]) 10/07/2015   LLE  . Dyslipidemia   . Endocarditis   . Endocarditis 11/28/14  . Gait disorder   . Gastroparesis   . GERD (gastroesophageal reflux disease)   . Gout   . Hearing difficulty   . Hyperlipidemia   . Hypertension   . Incomplete bladder emptying   . Leg pain 02/05/11   with walking  . Morbid obesity ([place])   . Nervousness(799.21)   . Obstructive sleep apnea    not using CPAP- lost 100lbs  . Osteomyelitis ([place])   . Posttraumatic stress disorder   . Renal transplant, status post 04/05/2014  . Rotator cuff disorder   . Secondary hyperparathyroidism ([place])   . Sepsis ([place])    Postive blood cultures -   . Shortness of breath    when lying flat  .  Urethral stricture   . Vitamin D deficiency   . Weight gain     Past Surgical History:  Procedure Laterality Date  . ARTERIOVENOUS GRAFT PLACEMENT Bilateral "several"  . AV FISTULA PLACEMENT Bilateral 2015  . BASCILIC VEIN TRANSPOSITION Right 09/27/2015  . [place] Right 09/27/2015   Procedure: BASILIC VEIN TRANSPOSITION right;  Surgeon: Mal Misty, MD;  Location: [facility];  Service: Vascular;  Laterality: Right;  . CATARACT EXTRACTION W/ INTRAOCULAR LENS  IMPLANT, BILATERAL Bilateral   . CYSTOSCOPY W/ INTERNAL URETHROTOMY  09/2014  . KIDNEY TRANSPLANT  2009    Current Outpatient Prescriptions  Medication Sig Dispense Refill  . ACCU-CHEK SMARTVIEW test strip USE FOUR  TIMES DAILY AS DIRECTED TO CHECK BLOOD SUGAR 400 each 0  . BD PEN NEEDLE NANO U/F 32G X 4 MM MISC USE  5  PER  DAY AS DIRECTED 450 each 1  . calcium acetate (PHOSLO) 667 MG capsule Take 3 capsules (2,001 mg total) by mouth 3 (three) times daily with meals. 270 capsule 0  . clonazePAM (KLONOPIN) 1 MG tablet     . cloNIDine (CATAPRES) 0.3 MG tablet TAKE 1 TABLET THREE TIMES DAILY 270 tablet 1  . Febuxostat (ULORIC) 80 MG TABS Take 1 tablet (80 mg total) by mouth daily. 90 tablet 2  . glucose blood (ACCU-CHEK GUIDE) test strip Check blood sugar four times daily 200 each 12  . HUMULIN R U-500 KWIKPEN 500 UNIT/ML kwikpen INJECT 15 UNITS AT BREAKFAST, 25 UNITS AT LUNCH AND 20 UNITS BEFORE SUPPER 6 mL 0  . hydrocortisone cream 1 % Apply to affected area 2 times daily 15 g 0  . Insulin Pen Needle 32G X 8 MM MISC Use as directed 100 each 0  . labetalol (NORMODYNE) 100 MG tablet TAKE 1 TABLET THREE TIMES DAILY 270 tablet 2  . metoprolol succinate (TOPROL-XL) 25 MG 24 hr tablet Take 1 tablet (25 mg total) by mouth daily. 30 tablet 3  . midodrine (PROAMATINE) 10 MG tablet Take 10 mg by mouth as needed (low blood pressure).     . RENVELA 800 MG tablet     . rosuvastatin (CRESTOR) 20 MG tablet Take 1 tablet (20 mg total) by mouth at bedtime. 90 tablet 2  . sertraline (ZOLOFT) 50 MG tablet     . tadalafil (CIALIS) 5 MG tablet Take 1 tablet (5 mg total) by mouth daily. 30 tablet 3  . TOUJEO SOLOSTAR 300 UNIT/ML SOPN INJECT  90 UNITS SUBCUTANEOUSLY TWICE DAILY 27 pen 0  . traMADol (ULTRAM) 50 MG tablet Take 1 tablet (50 mg total) by mouth every 8 (eight) hours as needed. 30 tablet 0  . VELTASSA 8.4 g packet     . warfarin (COUMADIN) 5 MG tablet Take 1 (5 mg) tablet daily. 105 tablet 1  . Lancets (ACCU-CHEK MULTICLIX) lancets Check blood sugar four times daily 200 each 12   No current facility-administered medications for this visit.     Allergies  Allergen Reactions  . Pork-Derived Products Anaphylaxis  and Other (See Comments)    Fever also   . Ace Inhibitors Cough  . Hydrocodone Other (See Comments)    Hallucinations    Social History   Social History  . Marital status: Divorced    Spouse name: N/A  . Number of children: 0  . Years of education: College    Occupational History  .  Unemployed   Social History Main Topics  . Smoking status: Never Smoker  .  Smokeless tobacco: Never Used  . Alcohol use No  . Drug use: No  . Sexual activity: Not Currently   Other Topics Concern  . Not on file   Social History Narrative   Patient is Divorced.   Patient does not have any children.   Patient is right-handed   Patient is currently in college working on his Ph.D.          Family History  Problem Relation Age of Onset  . Hypertension Mother   . Diabetes Father     Review of Systems:  As stated in the HPI and otherwise negative.   BP 130/70   Pulse 95   Ht 6' (1.829 m)   Wt 278 lb 12.8 oz (126.5 kg)   SpO2 98%   BMI 37.81 kg/m   Physical Examination:  General: Well developed, well nourished, NAD  HEENT: OP clear, mucus membranes moist  SKIN: warm, dry. No rashes. Neuro: No focal deficits  Musculoskeletal: Muscle strength 5/5 all ext  Psychiatric: Mood and affect normal  Neck: No JVD, no carotid bruits, no thyromegaly, no lymphadenopathy.  Lungs:Clear bilaterally, no wheezes, rhonci, crackles Cardiovascular: Regular rate and rhythm. No murmurs, gallops or rubs. Abdomen:Soft. Bowel sounds present. Non-tender.  Extremities: No lower extremity edema. Pulses are 2 + in the bilateral DP/PT.  Echo March 2018: Left ventricle: The cavity size was normal. Wall thickness was   increased in a pattern of moderate LVH. There was mild concentric   hypertrophy. Systolic function was normal. The estimated ejection   fraction was in the range of 60% to 65%. Wall motion was normal;   there were no regional wall motion abnormalities. Left   ventricular diastolic function  parameters were normal. - Aortic valve: Transvalvular velocity was within the normal range.   There was no stenosis. There was moderate regurgitation. - Mitral valve: Transvalvular velocity was within the normal range.   There was no evidence for stenosis. There was no regurgitation. - Left atrium: The atrium was moderately dilated. - Right ventricle: The cavity size was normal. Wall thickness was   normal. Systolic function was normal. - Tricuspid valve: There was mild regurgitation. - Pulmonary arteries: Systolic pressure was at the upper limits of   normal. PA peak pressure: 39 mm Hg (S).  EKG:  EKG is not  ordered today. The ekg ordered today demonstrates   Recent Labs: 05/11/2016: TSH 3.02 12/15/2016: BUN 32; Creatinine, Ser 6.12; Hemoglobin 12.2; Platelets 191; Potassium 4.6; Sodium 136    Wt Readings from Last 3 Encounters:  02/19/17 278 lb 12.8 oz (126.5 kg)  02/12/17 279 lb 9.6 oz (126.8 kg)  11/29/16 271 lb 2.7 oz (123 kg)     Other studies Reviewed: Additional studies/ records that were reviewed today include: . Review of the above records demonstrates:    Assessment and Plan:   1. Aortic insufficiency: Patient was noted to have severe aortic insufficiency at the time of TEE in February 2016 during his hospitalization for enterococcal bacteremia at [facility]. Surgery was recommended, however, he declined and failed to follow-up with the surgeons at [facility]. Echo March 2018 with moderate AI. No signs of heart failure. Repeat echo in march 2019.   2. Endocarditis: This was in May 2016 at [facility]. He completed antibiotic therapy. This has been stable. As above, moderate AI but no obvious vegetations on his aortic valve.    3. Hypertensive heart disease with heart failure : BP is controlled. No changes.   4.  Chronic diastolic CHF: Volume managed by HD, Nephrology  5. HLD: Continue statin.   6. End stage renal disease: He is on HD.  7. Morbid obesity: Weight  loss discussed.   8 . Atrial tachycardia: Unclear if this was flutter or other atrial tach. Will call with recurrent palpitations. Continue Toprol  Current medicines are reviewed at length with the patient today.  The patient does not have concerns regarding medicines.  The following changes have been made:  no change  Labs/ tests ordered today include:   Orders Placed This Encounter  Procedures  . ECHOCARDIOGRAM COMPLETE     Disposition:   FU with me in 12 months   Signed, Lauree Chandler, MD 02/19/2017 1:33 PM    [facility] [address], [place], [place]  [address] Phone: [phone]; Fax: [phone]

## 2017-02-19 NOTE — Patient Instructions (Signed)
Medication Instructions:  Your physician recommends that you continue on your current medications as directed. Please refer to the Current Medication list given to you today.   Labwork: none  Testing/Procedures: Your physician has requested that you have an echocardiogram. Echocardiography is a painless test that uses sound waves to create images of your heart. It provides your doctor with information about the size and shape of your heart and how well your heart's chambers and valves are working. This procedure takes approximately one hour. There are no restrictions for this procedure.--To be done in 12 months.     Follow-Up: Your physician recommends that you schedule a follow-up appointment in: 12 months.  A week or 2 after echo.  Please call our office in about 9 months to schedule this appointment    Any Other Special Instructions Will Be Listed Below (If Applicable).     If you need a refill on your cardiac medications before your next appointment, please call your pharmacy.

## 2017-02-20 DIAGNOSIS — E1129 Type 2 diabetes mellitus with other diabetic kidney complication: Secondary | ICD-10-CM | POA: Diagnosis not present

## 2017-02-20 DIAGNOSIS — E1122 Type 2 diabetes mellitus with diabetic chronic kidney disease: Secondary | ICD-10-CM | POA: Diagnosis not present

## 2017-02-20 DIAGNOSIS — N186 End stage renal disease: Secondary | ICD-10-CM | POA: Diagnosis not present

## 2017-02-20 DIAGNOSIS — N2581 Secondary hyperparathyroidism of renal origin: Secondary | ICD-10-CM | POA: Diagnosis not present

## 2017-02-20 DIAGNOSIS — D631 Anemia in chronic kidney disease: Secondary | ICD-10-CM | POA: Diagnosis not present

## 2017-02-23 DIAGNOSIS — D631 Anemia in chronic kidney disease: Secondary | ICD-10-CM | POA: Diagnosis not present

## 2017-02-23 DIAGNOSIS — E1122 Type 2 diabetes mellitus with diabetic chronic kidney disease: Secondary | ICD-10-CM | POA: Diagnosis not present

## 2017-02-23 DIAGNOSIS — E1129 Type 2 diabetes mellitus with other diabetic kidney complication: Secondary | ICD-10-CM | POA: Diagnosis not present

## 2017-02-23 DIAGNOSIS — N186 End stage renal disease: Secondary | ICD-10-CM | POA: Diagnosis not present

## 2017-02-23 DIAGNOSIS — N2581 Secondary hyperparathyroidism of renal origin: Secondary | ICD-10-CM | POA: Diagnosis not present

## 2017-02-25 DIAGNOSIS — I82492 Acute embolism and thrombosis of other specified deep vein of left lower extremity: Secondary | ICD-10-CM | POA: Diagnosis not present

## 2017-02-25 DIAGNOSIS — D631 Anemia in chronic kidney disease: Secondary | ICD-10-CM | POA: Diagnosis not present

## 2017-02-25 DIAGNOSIS — N2581 Secondary hyperparathyroidism of renal origin: Secondary | ICD-10-CM | POA: Diagnosis not present

## 2017-02-25 DIAGNOSIS — E1129 Type 2 diabetes mellitus with other diabetic kidney complication: Secondary | ICD-10-CM | POA: Diagnosis not present

## 2017-02-25 DIAGNOSIS — N186 End stage renal disease: Secondary | ICD-10-CM | POA: Diagnosis not present

## 2017-02-25 DIAGNOSIS — E1122 Type 2 diabetes mellitus with diabetic chronic kidney disease: Secondary | ICD-10-CM | POA: Diagnosis not present

## 2017-02-27 DIAGNOSIS — E1122 Type 2 diabetes mellitus with diabetic chronic kidney disease: Secondary | ICD-10-CM | POA: Diagnosis not present

## 2017-02-27 DIAGNOSIS — E1129 Type 2 diabetes mellitus with other diabetic kidney complication: Secondary | ICD-10-CM | POA: Diagnosis not present

## 2017-02-27 DIAGNOSIS — N186 End stage renal disease: Secondary | ICD-10-CM | POA: Diagnosis not present

## 2017-02-27 DIAGNOSIS — D631 Anemia in chronic kidney disease: Secondary | ICD-10-CM | POA: Diagnosis not present

## 2017-02-27 DIAGNOSIS — N2581 Secondary hyperparathyroidism of renal origin: Secondary | ICD-10-CM | POA: Diagnosis not present

## 2017-03-02 DIAGNOSIS — E1129 Type 2 diabetes mellitus with other diabetic kidney complication: Secondary | ICD-10-CM | POA: Diagnosis not present

## 2017-03-02 DIAGNOSIS — D631 Anemia in chronic kidney disease: Secondary | ICD-10-CM | POA: Diagnosis not present

## 2017-03-02 DIAGNOSIS — N2581 Secondary hyperparathyroidism of renal origin: Secondary | ICD-10-CM | POA: Diagnosis not present

## 2017-03-02 DIAGNOSIS — N186 End stage renal disease: Secondary | ICD-10-CM | POA: Diagnosis not present

## 2017-03-02 DIAGNOSIS — E1122 Type 2 diabetes mellitus with diabetic chronic kidney disease: Secondary | ICD-10-CM | POA: Diagnosis not present

## 2017-03-04 DIAGNOSIS — E1129 Type 2 diabetes mellitus with other diabetic kidney complication: Secondary | ICD-10-CM | POA: Diagnosis not present

## 2017-03-04 DIAGNOSIS — M109 Gout, unspecified: Secondary | ICD-10-CM | POA: Diagnosis not present

## 2017-03-04 DIAGNOSIS — E1122 Type 2 diabetes mellitus with diabetic chronic kidney disease: Secondary | ICD-10-CM | POA: Diagnosis not present

## 2017-03-04 DIAGNOSIS — I82492 Acute embolism and thrombosis of other specified deep vein of left lower extremity: Secondary | ICD-10-CM | POA: Diagnosis not present

## 2017-03-04 DIAGNOSIS — D631 Anemia in chronic kidney disease: Secondary | ICD-10-CM | POA: Diagnosis not present

## 2017-03-04 DIAGNOSIS — N186 End stage renal disease: Secondary | ICD-10-CM | POA: Diagnosis not present

## 2017-03-04 DIAGNOSIS — N2581 Secondary hyperparathyroidism of renal origin: Secondary | ICD-10-CM | POA: Diagnosis not present

## 2017-03-06 DIAGNOSIS — N186 End stage renal disease: Secondary | ICD-10-CM | POA: Diagnosis not present

## 2017-03-06 DIAGNOSIS — D631 Anemia in chronic kidney disease: Secondary | ICD-10-CM | POA: Diagnosis not present

## 2017-03-06 DIAGNOSIS — N2581 Secondary hyperparathyroidism of renal origin: Secondary | ICD-10-CM | POA: Diagnosis not present

## 2017-03-06 DIAGNOSIS — E1129 Type 2 diabetes mellitus with other diabetic kidney complication: Secondary | ICD-10-CM | POA: Diagnosis not present

## 2017-03-06 DIAGNOSIS — E1122 Type 2 diabetes mellitus with diabetic chronic kidney disease: Secondary | ICD-10-CM | POA: Diagnosis not present

## 2017-03-09 DIAGNOSIS — D631 Anemia in chronic kidney disease: Secondary | ICD-10-CM | POA: Diagnosis not present

## 2017-03-09 DIAGNOSIS — N2581 Secondary hyperparathyroidism of renal origin: Secondary | ICD-10-CM | POA: Diagnosis not present

## 2017-03-09 DIAGNOSIS — N186 End stage renal disease: Secondary | ICD-10-CM | POA: Diagnosis not present

## 2017-03-09 DIAGNOSIS — E1122 Type 2 diabetes mellitus with diabetic chronic kidney disease: Secondary | ICD-10-CM | POA: Diagnosis not present

## 2017-03-09 DIAGNOSIS — E1129 Type 2 diabetes mellitus with other diabetic kidney complication: Secondary | ICD-10-CM | POA: Diagnosis not present

## 2017-03-11 DIAGNOSIS — E1129 Type 2 diabetes mellitus with other diabetic kidney complication: Secondary | ICD-10-CM | POA: Diagnosis not present

## 2017-03-11 DIAGNOSIS — I82492 Acute embolism and thrombosis of other specified deep vein of left lower extremity: Secondary | ICD-10-CM | POA: Diagnosis not present

## 2017-03-11 DIAGNOSIS — N186 End stage renal disease: Secondary | ICD-10-CM | POA: Diagnosis not present

## 2017-03-11 DIAGNOSIS — E1122 Type 2 diabetes mellitus with diabetic chronic kidney disease: Secondary | ICD-10-CM | POA: Diagnosis not present

## 2017-03-11 DIAGNOSIS — D631 Anemia in chronic kidney disease: Secondary | ICD-10-CM | POA: Diagnosis not present

## 2017-03-11 DIAGNOSIS — N2581 Secondary hyperparathyroidism of renal origin: Secondary | ICD-10-CM | POA: Diagnosis not present

## 2017-03-11 DIAGNOSIS — Z992 Dependence on renal dialysis: Secondary | ICD-10-CM | POA: Diagnosis not present

## 2017-03-11 LAB — PROTIME-INR: INR: 2.4 — AB (ref <=1.1)

## 2017-03-13 DIAGNOSIS — Z23 Encounter for immunization: Secondary | ICD-10-CM | POA: Diagnosis not present

## 2017-03-13 DIAGNOSIS — E1122 Type 2 diabetes mellitus with diabetic chronic kidney disease: Secondary | ICD-10-CM | POA: Diagnosis not present

## 2017-03-13 DIAGNOSIS — N2581 Secondary hyperparathyroidism of renal origin: Secondary | ICD-10-CM | POA: Diagnosis not present

## 2017-03-13 DIAGNOSIS — D631 Anemia in chronic kidney disease: Secondary | ICD-10-CM | POA: Diagnosis not present

## 2017-03-13 DIAGNOSIS — E1129 Type 2 diabetes mellitus with other diabetic kidney complication: Secondary | ICD-10-CM | POA: Diagnosis not present

## 2017-03-13 DIAGNOSIS — N186 End stage renal disease: Secondary | ICD-10-CM | POA: Diagnosis not present

## 2017-03-16 ENCOUNTER — Ambulatory Visit (INDEPENDENT_AMBULATORY_CARE_PROVIDER_SITE_OTHER): Payer: Medicare Other | Admitting: General Practice

## 2017-03-16 DIAGNOSIS — E1122 Type 2 diabetes mellitus with diabetic chronic kidney disease: Secondary | ICD-10-CM | POA: Diagnosis not present

## 2017-03-16 DIAGNOSIS — E1129 Type 2 diabetes mellitus with other diabetic kidney complication: Secondary | ICD-10-CM | POA: Diagnosis not present

## 2017-03-16 DIAGNOSIS — D631 Anemia in chronic kidney disease: Secondary | ICD-10-CM | POA: Diagnosis not present

## 2017-03-16 DIAGNOSIS — N186 End stage renal disease: Secondary | ICD-10-CM | POA: Diagnosis not present

## 2017-03-16 DIAGNOSIS — N2581 Secondary hyperparathyroidism of renal origin: Secondary | ICD-10-CM | POA: Diagnosis not present

## 2017-03-16 NOTE — Patient Instructions (Signed)
Pre visit review using our clinic review tool, if applicable. No additional management support is needed unless otherwise documented below in the visit note. 

## 2017-03-16 NOTE — Progress Notes (Signed)
I have reviewed and agree with the plan. 

## 2017-03-17 ENCOUNTER — Other Ambulatory Visit: Payer: Self-pay | Admitting: Endocrinology

## 2017-03-17 ENCOUNTER — Other Ambulatory Visit: Payer: Self-pay | Admitting: Internal Medicine

## 2017-03-18 DIAGNOSIS — I82492 Acute embolism and thrombosis of other specified deep vein of left lower extremity: Secondary | ICD-10-CM | POA: Diagnosis not present

## 2017-03-18 DIAGNOSIS — N2581 Secondary hyperparathyroidism of renal origin: Secondary | ICD-10-CM | POA: Diagnosis not present

## 2017-03-18 DIAGNOSIS — E1122 Type 2 diabetes mellitus with diabetic chronic kidney disease: Secondary | ICD-10-CM | POA: Diagnosis not present

## 2017-03-18 DIAGNOSIS — N186 End stage renal disease: Secondary | ICD-10-CM | POA: Diagnosis not present

## 2017-03-18 DIAGNOSIS — E1129 Type 2 diabetes mellitus with other diabetic kidney complication: Secondary | ICD-10-CM | POA: Diagnosis not present

## 2017-03-18 DIAGNOSIS — D631 Anemia in chronic kidney disease: Secondary | ICD-10-CM | POA: Diagnosis not present

## 2017-03-20 DIAGNOSIS — D631 Anemia in chronic kidney disease: Secondary | ICD-10-CM | POA: Diagnosis not present

## 2017-03-20 DIAGNOSIS — N2581 Secondary hyperparathyroidism of renal origin: Secondary | ICD-10-CM | POA: Diagnosis not present

## 2017-03-20 DIAGNOSIS — E1129 Type 2 diabetes mellitus with other diabetic kidney complication: Secondary | ICD-10-CM | POA: Diagnosis not present

## 2017-03-20 DIAGNOSIS — E1122 Type 2 diabetes mellitus with diabetic chronic kidney disease: Secondary | ICD-10-CM | POA: Diagnosis not present

## 2017-03-20 DIAGNOSIS — N186 End stage renal disease: Secondary | ICD-10-CM | POA: Diagnosis not present

## 2017-03-23 DIAGNOSIS — D631 Anemia in chronic kidney disease: Secondary | ICD-10-CM | POA: Diagnosis not present

## 2017-03-23 DIAGNOSIS — N186 End stage renal disease: Secondary | ICD-10-CM | POA: Diagnosis not present

## 2017-03-23 DIAGNOSIS — N2581 Secondary hyperparathyroidism of renal origin: Secondary | ICD-10-CM | POA: Diagnosis not present

## 2017-03-23 DIAGNOSIS — E1122 Type 2 diabetes mellitus with diabetic chronic kidney disease: Secondary | ICD-10-CM | POA: Diagnosis not present

## 2017-03-23 DIAGNOSIS — E1129 Type 2 diabetes mellitus with other diabetic kidney complication: Secondary | ICD-10-CM | POA: Diagnosis not present

## 2017-03-25 DIAGNOSIS — E1122 Type 2 diabetes mellitus with diabetic chronic kidney disease: Secondary | ICD-10-CM | POA: Diagnosis not present

## 2017-03-25 DIAGNOSIS — N186 End stage renal disease: Secondary | ICD-10-CM | POA: Diagnosis not present

## 2017-03-25 DIAGNOSIS — N2581 Secondary hyperparathyroidism of renal origin: Secondary | ICD-10-CM | POA: Diagnosis not present

## 2017-03-25 DIAGNOSIS — I82492 Acute embolism and thrombosis of other specified deep vein of left lower extremity: Secondary | ICD-10-CM | POA: Diagnosis not present

## 2017-03-25 DIAGNOSIS — D631 Anemia in chronic kidney disease: Secondary | ICD-10-CM | POA: Diagnosis not present

## 2017-03-25 DIAGNOSIS — E1129 Type 2 diabetes mellitus with other diabetic kidney complication: Secondary | ICD-10-CM | POA: Diagnosis not present

## 2017-03-27 DIAGNOSIS — D631 Anemia in chronic kidney disease: Secondary | ICD-10-CM | POA: Diagnosis not present

## 2017-03-27 DIAGNOSIS — N2581 Secondary hyperparathyroidism of renal origin: Secondary | ICD-10-CM | POA: Diagnosis not present

## 2017-03-27 DIAGNOSIS — E1122 Type 2 diabetes mellitus with diabetic chronic kidney disease: Secondary | ICD-10-CM | POA: Diagnosis not present

## 2017-03-27 DIAGNOSIS — N186 End stage renal disease: Secondary | ICD-10-CM | POA: Diagnosis not present

## 2017-03-27 DIAGNOSIS — E1129 Type 2 diabetes mellitus with other diabetic kidney complication: Secondary | ICD-10-CM | POA: Diagnosis not present

## 2017-03-30 DIAGNOSIS — E1122 Type 2 diabetes mellitus with diabetic chronic kidney disease: Secondary | ICD-10-CM | POA: Diagnosis not present

## 2017-03-30 DIAGNOSIS — D631 Anemia in chronic kidney disease: Secondary | ICD-10-CM | POA: Diagnosis not present

## 2017-03-30 DIAGNOSIS — N2581 Secondary hyperparathyroidism of renal origin: Secondary | ICD-10-CM | POA: Diagnosis not present

## 2017-03-30 DIAGNOSIS — E1129 Type 2 diabetes mellitus with other diabetic kidney complication: Secondary | ICD-10-CM | POA: Diagnosis not present

## 2017-03-30 DIAGNOSIS — N186 End stage renal disease: Secondary | ICD-10-CM | POA: Diagnosis not present

## 2017-04-01 DIAGNOSIS — D631 Anemia in chronic kidney disease: Secondary | ICD-10-CM | POA: Diagnosis not present

## 2017-04-01 DIAGNOSIS — N2581 Secondary hyperparathyroidism of renal origin: Secondary | ICD-10-CM | POA: Diagnosis not present

## 2017-04-01 DIAGNOSIS — I82492 Acute embolism and thrombosis of other specified deep vein of left lower extremity: Secondary | ICD-10-CM | POA: Diagnosis not present

## 2017-04-01 DIAGNOSIS — E1122 Type 2 diabetes mellitus with diabetic chronic kidney disease: Secondary | ICD-10-CM | POA: Diagnosis not present

## 2017-04-01 DIAGNOSIS — E1129 Type 2 diabetes mellitus with other diabetic kidney complication: Secondary | ICD-10-CM | POA: Diagnosis not present

## 2017-04-01 DIAGNOSIS — N186 End stage renal disease: Secondary | ICD-10-CM | POA: Diagnosis not present

## 2017-04-03 DIAGNOSIS — N186 End stage renal disease: Secondary | ICD-10-CM | POA: Diagnosis not present

## 2017-04-03 DIAGNOSIS — N2581 Secondary hyperparathyroidism of renal origin: Secondary | ICD-10-CM | POA: Diagnosis not present

## 2017-04-03 DIAGNOSIS — E1129 Type 2 diabetes mellitus with other diabetic kidney complication: Secondary | ICD-10-CM | POA: Diagnosis not present

## 2017-04-03 DIAGNOSIS — D631 Anemia in chronic kidney disease: Secondary | ICD-10-CM | POA: Diagnosis not present

## 2017-04-03 DIAGNOSIS — E1122 Type 2 diabetes mellitus with diabetic chronic kidney disease: Secondary | ICD-10-CM | POA: Diagnosis not present

## 2017-04-06 DIAGNOSIS — E1122 Type 2 diabetes mellitus with diabetic chronic kidney disease: Secondary | ICD-10-CM | POA: Diagnosis not present

## 2017-04-06 DIAGNOSIS — N186 End stage renal disease: Secondary | ICD-10-CM | POA: Diagnosis not present

## 2017-04-06 DIAGNOSIS — D631 Anemia in chronic kidney disease: Secondary | ICD-10-CM | POA: Diagnosis not present

## 2017-04-06 DIAGNOSIS — E1129 Type 2 diabetes mellitus with other diabetic kidney complication: Secondary | ICD-10-CM | POA: Diagnosis not present

## 2017-04-06 DIAGNOSIS — N2581 Secondary hyperparathyroidism of renal origin: Secondary | ICD-10-CM | POA: Diagnosis not present

## 2017-04-08 DIAGNOSIS — D631 Anemia in chronic kidney disease: Secondary | ICD-10-CM | POA: Diagnosis not present

## 2017-04-08 DIAGNOSIS — N2581 Secondary hyperparathyroidism of renal origin: Secondary | ICD-10-CM | POA: Diagnosis not present

## 2017-04-08 DIAGNOSIS — E1122 Type 2 diabetes mellitus with diabetic chronic kidney disease: Secondary | ICD-10-CM | POA: Diagnosis not present

## 2017-04-08 DIAGNOSIS — N186 End stage renal disease: Secondary | ICD-10-CM | POA: Diagnosis not present

## 2017-04-08 DIAGNOSIS — E1129 Type 2 diabetes mellitus with other diabetic kidney complication: Secondary | ICD-10-CM | POA: Diagnosis not present

## 2017-04-10 DIAGNOSIS — Z992 Dependence on renal dialysis: Secondary | ICD-10-CM | POA: Diagnosis not present

## 2017-04-10 DIAGNOSIS — I82492 Acute embolism and thrombosis of other specified deep vein of left lower extremity: Secondary | ICD-10-CM | POA: Diagnosis not present

## 2017-04-10 DIAGNOSIS — D631 Anemia in chronic kidney disease: Secondary | ICD-10-CM | POA: Diagnosis not present

## 2017-04-10 DIAGNOSIS — N2581 Secondary hyperparathyroidism of renal origin: Secondary | ICD-10-CM | POA: Diagnosis not present

## 2017-04-10 DIAGNOSIS — E1122 Type 2 diabetes mellitus with diabetic chronic kidney disease: Secondary | ICD-10-CM | POA: Diagnosis not present

## 2017-04-10 DIAGNOSIS — N186 End stage renal disease: Secondary | ICD-10-CM | POA: Diagnosis not present

## 2017-04-10 DIAGNOSIS — M109 Gout, unspecified: Secondary | ICD-10-CM | POA: Diagnosis not present

## 2017-04-10 DIAGNOSIS — E1129 Type 2 diabetes mellitus with other diabetic kidney complication: Secondary | ICD-10-CM | POA: Diagnosis not present

## 2017-04-13 DIAGNOSIS — E1122 Type 2 diabetes mellitus with diabetic chronic kidney disease: Secondary | ICD-10-CM | POA: Diagnosis not present

## 2017-04-13 DIAGNOSIS — E875 Hyperkalemia: Secondary | ICD-10-CM | POA: Diagnosis not present

## 2017-04-13 DIAGNOSIS — H61329 Acquired stenosis of external ear canal secondary to inflammation and infection, unspecified ear: Secondary | ICD-10-CM | POA: Diagnosis not present

## 2017-04-13 DIAGNOSIS — E1129 Type 2 diabetes mellitus with other diabetic kidney complication: Secondary | ICD-10-CM | POA: Diagnosis not present

## 2017-04-13 DIAGNOSIS — N2581 Secondary hyperparathyroidism of renal origin: Secondary | ICD-10-CM | POA: Diagnosis not present

## 2017-04-13 DIAGNOSIS — N186 End stage renal disease: Secondary | ICD-10-CM | POA: Diagnosis not present

## 2017-04-13 DIAGNOSIS — D631 Anemia in chronic kidney disease: Secondary | ICD-10-CM | POA: Diagnosis not present

## 2017-04-15 DIAGNOSIS — I82492 Acute embolism and thrombosis of other specified deep vein of left lower extremity: Secondary | ICD-10-CM | POA: Diagnosis not present

## 2017-04-15 DIAGNOSIS — N186 End stage renal disease: Secondary | ICD-10-CM | POA: Diagnosis not present

## 2017-04-15 DIAGNOSIS — H61329 Acquired stenosis of external ear canal secondary to inflammation and infection, unspecified ear: Secondary | ICD-10-CM | POA: Diagnosis not present

## 2017-04-15 DIAGNOSIS — E1129 Type 2 diabetes mellitus with other diabetic kidney complication: Secondary | ICD-10-CM | POA: Diagnosis not present

## 2017-04-15 DIAGNOSIS — N2581 Secondary hyperparathyroidism of renal origin: Secondary | ICD-10-CM | POA: Diagnosis not present

## 2017-04-15 DIAGNOSIS — E1122 Type 2 diabetes mellitus with diabetic chronic kidney disease: Secondary | ICD-10-CM | POA: Diagnosis not present

## 2017-04-15 DIAGNOSIS — D631 Anemia in chronic kidney disease: Secondary | ICD-10-CM | POA: Diagnosis not present

## 2017-04-17 DIAGNOSIS — N2581 Secondary hyperparathyroidism of renal origin: Secondary | ICD-10-CM | POA: Diagnosis not present

## 2017-04-17 DIAGNOSIS — N186 End stage renal disease: Secondary | ICD-10-CM | POA: Diagnosis not present

## 2017-04-17 DIAGNOSIS — D631 Anemia in chronic kidney disease: Secondary | ICD-10-CM | POA: Diagnosis not present

## 2017-04-17 DIAGNOSIS — E1122 Type 2 diabetes mellitus with diabetic chronic kidney disease: Secondary | ICD-10-CM | POA: Diagnosis not present

## 2017-04-17 DIAGNOSIS — E1129 Type 2 diabetes mellitus with other diabetic kidney complication: Secondary | ICD-10-CM | POA: Diagnosis not present

## 2017-04-17 DIAGNOSIS — H61329 Acquired stenosis of external ear canal secondary to inflammation and infection, unspecified ear: Secondary | ICD-10-CM | POA: Diagnosis not present

## 2017-04-19 ENCOUNTER — Ambulatory Visit: Payer: Medicare Other | Admitting: Endocrinology

## 2017-04-20 ENCOUNTER — Other Ambulatory Visit: Payer: Self-pay | Admitting: Endocrinology

## 2017-04-20 DIAGNOSIS — N186 End stage renal disease: Secondary | ICD-10-CM | POA: Diagnosis not present

## 2017-04-20 DIAGNOSIS — D631 Anemia in chronic kidney disease: Secondary | ICD-10-CM | POA: Diagnosis not present

## 2017-04-20 DIAGNOSIS — E1122 Type 2 diabetes mellitus with diabetic chronic kidney disease: Secondary | ICD-10-CM | POA: Diagnosis not present

## 2017-04-20 DIAGNOSIS — N2581 Secondary hyperparathyroidism of renal origin: Secondary | ICD-10-CM | POA: Diagnosis not present

## 2017-04-20 DIAGNOSIS — H61329 Acquired stenosis of external ear canal secondary to inflammation and infection, unspecified ear: Secondary | ICD-10-CM | POA: Diagnosis not present

## 2017-04-20 DIAGNOSIS — E1129 Type 2 diabetes mellitus with other diabetic kidney complication: Secondary | ICD-10-CM | POA: Diagnosis not present

## 2017-04-22 DIAGNOSIS — I82492 Acute embolism and thrombosis of other specified deep vein of left lower extremity: Secondary | ICD-10-CM | POA: Diagnosis not present

## 2017-04-22 DIAGNOSIS — E1129 Type 2 diabetes mellitus with other diabetic kidney complication: Secondary | ICD-10-CM | POA: Diagnosis not present

## 2017-04-22 DIAGNOSIS — H61329 Acquired stenosis of external ear canal secondary to inflammation and infection, unspecified ear: Secondary | ICD-10-CM | POA: Diagnosis not present

## 2017-04-22 DIAGNOSIS — D631 Anemia in chronic kidney disease: Secondary | ICD-10-CM | POA: Diagnosis not present

## 2017-04-22 DIAGNOSIS — E1122 Type 2 diabetes mellitus with diabetic chronic kidney disease: Secondary | ICD-10-CM | POA: Diagnosis not present

## 2017-04-22 DIAGNOSIS — N2581 Secondary hyperparathyroidism of renal origin: Secondary | ICD-10-CM | POA: Diagnosis not present

## 2017-04-22 DIAGNOSIS — N186 End stage renal disease: Secondary | ICD-10-CM | POA: Diagnosis not present

## 2017-04-22 LAB — PROTIME-INR: INR: 2.4 — AB (ref <=1.1)

## 2017-04-24 DIAGNOSIS — E1129 Type 2 diabetes mellitus with other diabetic kidney complication: Secondary | ICD-10-CM | POA: Diagnosis not present

## 2017-04-24 DIAGNOSIS — N186 End stage renal disease: Secondary | ICD-10-CM | POA: Diagnosis not present

## 2017-04-24 DIAGNOSIS — E1122 Type 2 diabetes mellitus with diabetic chronic kidney disease: Secondary | ICD-10-CM | POA: Diagnosis not present

## 2017-04-24 DIAGNOSIS — N2581 Secondary hyperparathyroidism of renal origin: Secondary | ICD-10-CM | POA: Diagnosis not present

## 2017-04-24 DIAGNOSIS — H61329 Acquired stenosis of external ear canal secondary to inflammation and infection, unspecified ear: Secondary | ICD-10-CM | POA: Diagnosis not present

## 2017-04-24 DIAGNOSIS — D631 Anemia in chronic kidney disease: Secondary | ICD-10-CM | POA: Diagnosis not present

## 2017-04-26 DIAGNOSIS — N186 End stage renal disease: Secondary | ICD-10-CM | POA: Diagnosis not present

## 2017-04-26 DIAGNOSIS — N2581 Secondary hyperparathyroidism of renal origin: Secondary | ICD-10-CM | POA: Diagnosis not present

## 2017-04-26 DIAGNOSIS — E8770 Fluid overload, unspecified: Secondary | ICD-10-CM | POA: Diagnosis not present

## 2017-04-27 DIAGNOSIS — E1129 Type 2 diabetes mellitus with other diabetic kidney complication: Secondary | ICD-10-CM | POA: Diagnosis not present

## 2017-04-27 DIAGNOSIS — N2581 Secondary hyperparathyroidism of renal origin: Secondary | ICD-10-CM | POA: Diagnosis not present

## 2017-04-27 DIAGNOSIS — N186 End stage renal disease: Secondary | ICD-10-CM | POA: Diagnosis not present

## 2017-04-27 DIAGNOSIS — E1122 Type 2 diabetes mellitus with diabetic chronic kidney disease: Secondary | ICD-10-CM | POA: Diagnosis not present

## 2017-04-27 DIAGNOSIS — D631 Anemia in chronic kidney disease: Secondary | ICD-10-CM | POA: Diagnosis not present

## 2017-04-27 DIAGNOSIS — H61329 Acquired stenosis of external ear canal secondary to inflammation and infection, unspecified ear: Secondary | ICD-10-CM | POA: Diagnosis not present

## 2017-04-29 ENCOUNTER — Ambulatory Visit (INDEPENDENT_AMBULATORY_CARE_PROVIDER_SITE_OTHER): Payer: Medicare Other | Admitting: General Practice

## 2017-04-29 DIAGNOSIS — I82502 Chronic embolism and thrombosis of unspecified deep veins of left lower extremity: Secondary | ICD-10-CM | POA: Diagnosis not present

## 2017-04-29 DIAGNOSIS — E1122 Type 2 diabetes mellitus with diabetic chronic kidney disease: Secondary | ICD-10-CM | POA: Diagnosis not present

## 2017-04-29 DIAGNOSIS — Z5181 Encounter for therapeutic drug level monitoring: Secondary | ICD-10-CM | POA: Diagnosis not present

## 2017-04-29 DIAGNOSIS — H61329 Acquired stenosis of external ear canal secondary to inflammation and infection, unspecified ear: Secondary | ICD-10-CM | POA: Diagnosis not present

## 2017-04-29 DIAGNOSIS — N186 End stage renal disease: Secondary | ICD-10-CM | POA: Diagnosis not present

## 2017-04-29 DIAGNOSIS — I82492 Acute embolism and thrombosis of other specified deep vein of left lower extremity: Secondary | ICD-10-CM | POA: Diagnosis not present

## 2017-04-29 DIAGNOSIS — D631 Anemia in chronic kidney disease: Secondary | ICD-10-CM | POA: Diagnosis not present

## 2017-04-29 DIAGNOSIS — E1129 Type 2 diabetes mellitus with other diabetic kidney complication: Secondary | ICD-10-CM | POA: Diagnosis not present

## 2017-04-29 DIAGNOSIS — N2581 Secondary hyperparathyroidism of renal origin: Secondary | ICD-10-CM | POA: Diagnosis not present

## 2017-04-29 NOTE — Patient Instructions (Signed)
Pre visit review using our clinic review tool, if applicable. No additional management support is needed unless otherwise documented below in the visit note. 

## 2017-04-29 NOTE — Progress Notes (Signed)
I have reviewed and agree with the plan. 

## 2017-04-30 LAB — PROTIME-INR: INR: 3.3 — AB (ref 0.9–1.1)

## 2017-05-01 DIAGNOSIS — E1122 Type 2 diabetes mellitus with diabetic chronic kidney disease: Secondary | ICD-10-CM | POA: Diagnosis not present

## 2017-05-01 DIAGNOSIS — N2581 Secondary hyperparathyroidism of renal origin: Secondary | ICD-10-CM | POA: Diagnosis not present

## 2017-05-01 DIAGNOSIS — E1129 Type 2 diabetes mellitus with other diabetic kidney complication: Secondary | ICD-10-CM | POA: Diagnosis not present

## 2017-05-01 DIAGNOSIS — N186 End stage renal disease: Secondary | ICD-10-CM | POA: Diagnosis not present

## 2017-05-01 DIAGNOSIS — D631 Anemia in chronic kidney disease: Secondary | ICD-10-CM | POA: Diagnosis not present

## 2017-05-01 DIAGNOSIS — H61329 Acquired stenosis of external ear canal secondary to inflammation and infection, unspecified ear: Secondary | ICD-10-CM | POA: Diagnosis not present

## 2017-05-04 DIAGNOSIS — E1129 Type 2 diabetes mellitus with other diabetic kidney complication: Secondary | ICD-10-CM | POA: Diagnosis not present

## 2017-05-04 DIAGNOSIS — N186 End stage renal disease: Secondary | ICD-10-CM | POA: Diagnosis not present

## 2017-05-04 DIAGNOSIS — E1122 Type 2 diabetes mellitus with diabetic chronic kidney disease: Secondary | ICD-10-CM | POA: Diagnosis not present

## 2017-05-04 DIAGNOSIS — H61329 Acquired stenosis of external ear canal secondary to inflammation and infection, unspecified ear: Secondary | ICD-10-CM | POA: Diagnosis not present

## 2017-05-04 DIAGNOSIS — D631 Anemia in chronic kidney disease: Secondary | ICD-10-CM | POA: Diagnosis not present

## 2017-05-04 DIAGNOSIS — N2581 Secondary hyperparathyroidism of renal origin: Secondary | ICD-10-CM | POA: Diagnosis not present

## 2017-05-06 ENCOUNTER — Encounter (HOSPITAL_BASED_OUTPATIENT_CLINIC_OR_DEPARTMENT_OTHER): Payer: Self-pay | Admitting: *Deleted

## 2017-05-06 ENCOUNTER — Telehealth (HOSPITAL_BASED_OUTPATIENT_CLINIC_OR_DEPARTMENT_OTHER): Payer: Self-pay | Admitting: *Deleted

## 2017-05-06 ENCOUNTER — Emergency Department (HOSPITAL_BASED_OUTPATIENT_CLINIC_OR_DEPARTMENT_OTHER)
Admission: EM | Admit: 2017-05-06 | Discharge: 2017-05-06 | Disposition: A | Discharge status: Home / Self Care | Payer: Medicare Other | Attending: Emergency Medicine | Admitting: Emergency Medicine

## 2017-05-06 DIAGNOSIS — J449 Chronic obstructive pulmonary disease, unspecified: Secondary | ICD-10-CM | POA: Diagnosis not present

## 2017-05-06 DIAGNOSIS — N186 End stage renal disease: Secondary | ICD-10-CM | POA: Diagnosis not present

## 2017-05-06 DIAGNOSIS — E1129 Type 2 diabetes mellitus with other diabetic kidney complication: Secondary | ICD-10-CM | POA: Diagnosis not present

## 2017-05-06 DIAGNOSIS — E211 Secondary hyperparathyroidism, not elsewhere classified: Secondary | ICD-10-CM | POA: Diagnosis not present

## 2017-05-06 DIAGNOSIS — Z79899 Other long term (current) drug therapy: Secondary | ICD-10-CM | POA: Diagnosis not present

## 2017-05-06 DIAGNOSIS — Z940 Kidney transplant status: Secondary | ICD-10-CM | POA: Diagnosis not present

## 2017-05-06 DIAGNOSIS — H6091 Unspecified otitis externa, right ear: Secondary | ICD-10-CM

## 2017-05-06 DIAGNOSIS — N184 Chronic kidney disease, stage 4 (severe): Secondary | ICD-10-CM | POA: Insufficient documentation

## 2017-05-06 DIAGNOSIS — I509 Heart failure, unspecified: Secondary | ICD-10-CM | POA: Insufficient documentation

## 2017-05-06 DIAGNOSIS — I82492 Acute embolism and thrombosis of other specified deep vein of left lower extremity: Secondary | ICD-10-CM | POA: Diagnosis not present

## 2017-05-06 DIAGNOSIS — I129 Hypertensive chronic kidney disease with stage 1 through stage 4 chronic kidney disease, or unspecified chronic kidney disease: Secondary | ICD-10-CM | POA: Insufficient documentation

## 2017-05-06 DIAGNOSIS — E1122 Type 2 diabetes mellitus with diabetic chronic kidney disease: Secondary | ICD-10-CM | POA: Insufficient documentation

## 2017-05-06 DIAGNOSIS — Z794 Long term (current) use of insulin: Secondary | ICD-10-CM | POA: Insufficient documentation

## 2017-05-06 DIAGNOSIS — Z7901 Long term (current) use of anticoagulants: Secondary | ICD-10-CM | POA: Insufficient documentation

## 2017-05-06 DIAGNOSIS — N2581 Secondary hyperparathyroidism of renal origin: Secondary | ICD-10-CM | POA: Diagnosis not present

## 2017-05-06 DIAGNOSIS — D631 Anemia in chronic kidney disease: Secondary | ICD-10-CM | POA: Diagnosis not present

## 2017-05-06 DIAGNOSIS — M109 Gout, unspecified: Secondary | ICD-10-CM | POA: Diagnosis not present

## 2017-05-06 DIAGNOSIS — H61329 Acquired stenosis of external ear canal secondary to inflammation and infection, unspecified ear: Secondary | ICD-10-CM | POA: Diagnosis not present

## 2017-05-06 DIAGNOSIS — H6691 Otitis media, unspecified, right ear: Secondary | ICD-10-CM | POA: Diagnosis not present

## 2017-05-06 DIAGNOSIS — H9201 Otalgia, right ear: Secondary | ICD-10-CM | POA: Diagnosis present

## 2017-05-06 MED ORDER — CIPROFLOXACIN-DEXAMETHASONE 0.3-0.1 % OT SUSP: 4.0000 [drp] | Freq: Two times a day (BID) | OTIC | 0 refills | Status: AC

## 2017-05-06 MED ORDER — CEPHALEXIN 500 MG PO CAPS: 500.0000 mg | ORAL_CAPSULE | Freq: Four times a day (QID) | ORAL | 0 refills | Status: DC

## 2017-05-06 NOTE — ED Triage Notes (Signed)
Right ear pain x2 days.

## 2017-05-06 NOTE — Telephone Encounter (Signed)
Call received from [place] at [facility]. Pt's insurance does not cover ciprodex and they were wanting an alternative. VORB from Dr. Dayna Barker that Ofloxacin can be substituted with same dosing instructions as the ciprodex

## 2017-05-07 NOTE — ED Provider Notes (Signed)
[facility] DEPT [facility] Provider Note   CSN: [id number] Arrival date & time: 05/06/17  1250     History   Chief Complaint Chief Complaint  Patient presents with  . Otalgia    HPI Vanessa Alesi is a 51 y.o. male.   Otalgia  This is a new problem. The current episode started 12 to 24 hours ago. There is pain in the right ear. The problem occurs constantly. The problem has not changed since onset.There has been no fever. The pain is moderate. His past medical history does not include hearing loss.    Past Medical History:  Diagnosis Date  . Allergic rhinitis   . Anemia   . Aortic insufficiency    a. Echo 7/16:  Mod LVH, EF 50-55%, mild to mod AI, severe LAE, PASP 57 mmHg (LV ID end diastolic 93.8 mm);  b. Echo 2/17:  Mod LVH, EF 50-55%, mod AI, MAC, mild MR, mod LAE, mild to mod TR, PASP 63 mmHg  . Bilateral carpal tunnel syndrome   . CHF (congestive heart failure) ([place])   . Chronic kidney disease    Kidney Transplant 2009 09/26/15- transplanated kidney failing  . Chronic kidney disease (CKD), stage IV (severe) ([place])   . Chronic renal allograft nephropathy   . Claustrophobia   . Complication of anesthesia    "they said something at [facility] it was affected by sleep aqpnea."  . COPD (chronic obstructive pulmonary disease) ([place])   . Diabetes mellitus    Type II  . Diabetic peripheral neuropathy ([place])   . Diabetic retinopathy ([place])   . Diminished eyesight   . Discitis of lumbar region    resolved  . DVT (deep venous thrombosis) ([place]) 10/07/2015   LLE  . Dyslipidemia   . Endocarditis   . Endocarditis 11/28/14  . Gait disorder   . Gastroparesis   . GERD (gastroesophageal reflux disease)   . Gout   . Hearing difficulty   . Hyperlipidemia   . Hypertension   . Incomplete bladder emptying   . Leg pain 02/05/11   with walking  . Morbid obesity ([place])   . Nervousness(799.21)   . Obstructive sleep apnea    not using CPAP- lost 100lbs  . Osteomyelitis ([place])   .  Posttraumatic stress disorder   . Renal transplant, status post 04/05/2014  . Rotator cuff disorder   . Secondary hyperparathyroidism ([place])   . Sepsis ([place])    Postive blood cultures -   . Shortness of breath    when lying flat  . Urethral stricture   . Vitamin D deficiency   . Weight gain     Patient Active Problem List   Diagnosis Date Noted  . ESRD on dialysis ([place]) 11/15/2015  . CHF (congestive heart failure) ([place]) 10/07/2015  . Prolonged Q-T interval on ECG 10/07/2015  . Chronic deep vein thrombosis (DVT) of left lower extremity ([place]) 10/07/2015  . Aortic insufficiency 04/12/2015  . Back pain at L4-L5 level   . Anemia due to chronic kidney disease   . Cervical radiculopathy   . Diabetic peripheral neuropathy ([place]) 10/16/2014  . Renal transplant, status post 04/05/2014  . OSA (obstructive sleep apnea) 04/05/2014  . Severe obesity (BMI >= 40) ([place]) 04/05/2014  . Benign fibroma of prostate 02/05/2014  . Diabetic macular edema ([place]) 01/15/2012  . Diabetes mellitus, type 2 ([place]) 01/15/2012  . IMMUNOCOMPROMISED 01/30/2010  . HYPERPARATHYROIDISM, SECONDARY 01/27/2010  . GOUT 01/27/2010  . INSOMNIA 01/27/2010  . HLD (hyperlipidemia) 05/25/2008  .  Essential hypertension 05/25/2008    Past Surgical History:  Procedure Laterality Date  . ARTERIOVENOUS GRAFT PLACEMENT Bilateral "several"  . AV FISTULA PLACEMENT Bilateral 2015  . BASCILIC VEIN TRANSPOSITION Right 09/27/2015  . [place] Right 09/27/2015   Procedure: BASILIC VEIN TRANSPOSITION right;  Surgeon: Mal Misty, MD;  Location: [facility];  Service: Vascular;  Laterality: Right;  . CATARACT EXTRACTION W/ INTRAOCULAR LENS  IMPLANT, BILATERAL Bilateral   . CYSTOSCOPY W/ INTERNAL URETHROTOMY  09/2014  . KIDNEY TRANSPLANT  2009       Home Medications    Prior to Admission medications   Medication Sig Start Date End Date Taking? Authorizing Provider  ACCU-CHEK SMARTVIEW test strip USE FOUR TIMES  DAILY AS DIRECTED TO CHECK BLOOD SUGAR 09/23/16   Elayne Snare, MD  BD PEN NEEDLE NANO U/F 32G X 4 MM MISC USE  5  PER  DAY AS DIRECTED 08/26/16   Elayne Snare, MD  calcium acetate (PHOSLO) 667 MG capsule Take 3 capsules (2,001 mg total) by mouth 3 (three) times daily with meals. 10/26/15   Ghimire, Henreitta Leber, MD  cephALEXin (KEFLEX) 500 MG capsule Take 1 capsule (500 mg total) by mouth 4 (four) times daily. 05/06/17   Alianys Chacko, Corene Cornea, MD  ciprofloxacin-dexamethasone (CIPRODEX) OTIC suspension Place 4 drops into the right ear 2 (two) times daily. 05/06/17 05/13/17  Danuta Huseman, Corene Cornea, MD  clonazePAM Bobbye [place]) 1 MG tablet  12/24/16   [provider]  cloNIDine (CATAPRES) 0.3 MG tablet TAKE 1 TABLET THREE TIMES DAILY 07/14/16   Hoyt Koch, MD  Febuxostat (ULORIC) 80 MG TABS Take 1 tablet (80 mg total) by mouth daily. 04/07/16   Hoyt Koch, MD  glucose blood (ACCU-CHEK GUIDE) test strip Check blood sugar four times daily 09/02/16   Elayne Snare, MD  HUMULIN R U-500 KWIKPEN 500 UNIT/ML kwikpen INJECT SUBCUTANEOUSLY 15 UNITS AT [place], 25 UNITS AT LUNCH AND 20 UNITS BEFORE SUPPER 04/21/17   Elayne Snare, MD  hydrocortisone cream 1 % Apply to affected area 2 times daily 11/29/16   Blanchie Dessert, MD  Insulin Pen Needle 32G X 8 MM MISC Use as directed 10/26/15   Jonetta Osgood, MD  labetalol (NORMODYNE) 100 MG tablet TAKE 1 TABLET THREE TIMES DAILY 03/20/16   Hoyt Koch, MD  Lancets (ACCU-CHEK MULTICLIX) lancets Check blood sugar four times daily 09/02/16   Elayne Snare, MD  metoprolol succinate (TOPROL-XL) 25 MG 24 hr tablet Take 1 tablet (25 mg total) by mouth daily. 02/12/17   Elayne Snare, MD  midodrine (PROAMATINE) 10 MG tablet Take 10 mg by mouth as needed (low blood pressure).  01/08/17   [provider]  RENVELA 800 MG tablet  02/02/17   [provider]  rosuvastatin (CRESTOR) 20 MG tablet TAKE 1 TABLET AT BEDTIME 03/18/17   Hoyt Koch, MD    sertraline (ZOLOFT) 50 MG tablet  11/27/16   [provider]  tadalafil (CIALIS) 5 MG tablet Take 1 tablet (5 mg total) by mouth daily. 05/11/16   Hoyt Koch, MD  TOUJEO SOLOSTAR 300 UNIT/ML SOPN INJECT  90 UNITS SUBCUTANEOUSLY TWICE DAILY 04/21/17   Elayne Snare, MD  traMADol (ULTRAM) 50 MG tablet Take 1 tablet (50 mg total) by mouth every 8 (eight) hours as needed. 12/02/15   Hoyt Koch, MD  VELTASSA 8.4 g packet  12/30/16   [provider]  warfarin (COUMADIN) 5 MG tablet Take 1 (5 mg) tablet daily. 02/15/17  Hoyt Koch, MD    Family History Family History  Problem Relation Age of Onset  . Hypertension Mother   . Diabetes Father     Social History Social History  Substance Use Topics  . Smoking status: Never Smoker  . Smokeless tobacco: Never Used  . Alcohol use No     Allergies   Pork-derived products; Ace inhibitors; and Hydrocodone   Review of Systems Review of Systems  HENT: Positive for ear pain.   All other systems reviewed and are negative.    Physical Exam Updated Vital Signs BP (!) 126/40   Pulse 92   Temp 98.2 F (36.8 C) (Oral)   Resp 20   Ht 6' (1.829 m)   Wt 126.1 kg (278 lb)   SpO2 96%   BMI 37.70 kg/m   Physical Exam  Constitutional: He is oriented to person, place, and time. He appears well-developed and well-nourished.  HENT:  Head: Normocephalic and atraumatic.  Right Ear: Tympanic membrane is injected. A middle ear effusion is present.  Some white discharge in R EAC  Eyes: Conjunctivae and EOM are normal.  Neck: Normal range of motion.  Cardiovascular: Normal rate.   Pulmonary/Chest: Effort normal. No respiratory distress.  Abdominal: Soft. Bowel sounds are normal. He exhibits no distension.  Musculoskeletal: Normal range of motion.  Neurological: He is alert and oriented to person, place, and time.  Skin: Skin is warm and dry.  Nursing note and vitals reviewed.    ED Treatments /  Results  Labs (all labs ordered are listed, but only abnormal results are displayed) Labs Reviewed - No data to display  EKG  EKG Interpretation None       Radiology No results found.  Procedures Procedures (including critical care time)  Medications Ordered in ED Medications - No data to display   Initial Impression / Assessment and Plan / ED Course  I have reviewed the triage vital signs and the nursing notes.  Pertinent labs & imaging results that were available during my care of the patient were reviewed by me and considered in my medical decision making (see chart for details).     Suspect AOM and externa. Will rx for abx and drops.   Final Clinical Impressions(s) / ED Diagnoses   Final diagnoses:  Otitis externa of right ear, unspecified chronicity, unspecified type    New Prescriptions Discharge Medication List as of 05/06/2017  1:33 PM    START taking these medications   Details  cephALEXin (KEFLEX) 500 MG capsule Take 1 capsule (500 mg total) by mouth 4 (four) times daily., Starting Thu 05/06/2017, Print    ciprofloxacin-dexamethasone (CIPRODEX) OTIC suspension Place 4 drops into the right ear 2 (two) times daily., Starting Thu 05/06/2017, Until Thu 05/13/2017, Print         Danny Yackley, Corene Cornea, MD 05/07/17 [phone]

## 2017-05-08 ENCOUNTER — Emergency Department (HOSPITAL_COMMUNITY)
Admission: EM | Admit: 2017-05-08 | Discharge: 2017-05-08 | Disposition: A | Discharge status: Home / Self Care | Payer: Medicare Other | Attending: Emergency Medicine | Admitting: Emergency Medicine

## 2017-05-08 ENCOUNTER — Encounter (HOSPITAL_COMMUNITY): Payer: Self-pay

## 2017-05-08 DIAGNOSIS — H9209 Otalgia, unspecified ear: Secondary | ICD-10-CM | POA: Diagnosis not present

## 2017-05-08 DIAGNOSIS — D631 Anemia in chronic kidney disease: Secondary | ICD-10-CM | POA: Diagnosis not present

## 2017-05-08 DIAGNOSIS — E1122 Type 2 diabetes mellitus with diabetic chronic kidney disease: Secondary | ICD-10-CM | POA: Diagnosis not present

## 2017-05-08 DIAGNOSIS — E1129 Type 2 diabetes mellitus with other diabetic kidney complication: Secondary | ICD-10-CM | POA: Diagnosis not present

## 2017-05-08 DIAGNOSIS — N2581 Secondary hyperparathyroidism of renal origin: Secondary | ICD-10-CM | POA: Diagnosis not present

## 2017-05-08 DIAGNOSIS — R030 Elevated blood-pressure reading, without diagnosis of hypertension: Secondary | ICD-10-CM | POA: Diagnosis not present

## 2017-05-08 DIAGNOSIS — E1140 Type 2 diabetes mellitus with diabetic neuropathy, unspecified: Secondary | ICD-10-CM | POA: Insufficient documentation

## 2017-05-08 DIAGNOSIS — I509 Heart failure, unspecified: Secondary | ICD-10-CM | POA: Insufficient documentation

## 2017-05-08 DIAGNOSIS — H6690 Otitis media, unspecified, unspecified ear: Secondary | ICD-10-CM

## 2017-05-08 DIAGNOSIS — H61329 Acquired stenosis of external ear canal secondary to inflammation and infection, unspecified ear: Secondary | ICD-10-CM | POA: Insufficient documentation

## 2017-05-08 DIAGNOSIS — N186 End stage renal disease: Secondary | ICD-10-CM | POA: Diagnosis not present

## 2017-05-08 DIAGNOSIS — Z992 Dependence on renal dialysis: Secondary | ICD-10-CM | POA: Diagnosis not present

## 2017-05-08 DIAGNOSIS — H6691 Otitis media, unspecified, right ear: Secondary | ICD-10-CM | POA: Insufficient documentation

## 2017-05-08 DIAGNOSIS — Z794 Long term (current) use of insulin: Secondary | ICD-10-CM | POA: Insufficient documentation

## 2017-05-08 DIAGNOSIS — Z79899 Other long term (current) drug therapy: Secondary | ICD-10-CM | POA: Diagnosis not present

## 2017-05-08 DIAGNOSIS — H9201 Otalgia, right ear: Secondary | ICD-10-CM | POA: Diagnosis present

## 2017-05-08 DIAGNOSIS — Z7901 Long term (current) use of anticoagulants: Secondary | ICD-10-CM | POA: Diagnosis not present

## 2017-05-08 DIAGNOSIS — I132 Hypertensive heart and chronic kidney disease with heart failure and with stage 5 chronic kidney disease, or end stage renal disease: Secondary | ICD-10-CM | POA: Insufficient documentation

## 2017-05-08 MED ORDER — OXYCODONE-ACETAMINOPHEN 5-325 MG PO TABS: 1.0000 | ORAL_TABLET | Freq: Four times a day (QID) | ORAL | 0 refills | Status: DC | PRN

## 2017-05-08 MED ORDER — DEXTROSE 5 % IV SOLN
1.0000 g | Freq: Once | INTRAVENOUS | Status: AC
  Administered 2017-05-08: 1 g via INTRAVENOUS
  Filled 2017-05-08: qty 1

## 2017-05-08 MED ORDER — HYDROMORPHONE HCL 1 MG/ML IJ SOLN
1.0000 mg | Freq: Once | INTRAMUSCULAR | Status: AC
  Administered 2017-05-08: 1 mg via INTRAVENOUS
  Filled 2017-05-08: qty 1

## 2017-05-08 MED ORDER — LORAZEPAM 1 MG PO TABS
1.0000 mg | ORAL_TABLET | Freq: Once | ORAL | Status: AC
  Administered 2017-05-08: 1 mg via ORAL
  Filled 2017-05-08: qty 1

## 2017-05-08 NOTE — ED Notes (Signed)
I scanned the Ativan tablet in pt. Room, however, when I look it up it doesn't show in the [facility]. He takes this first; then, per his request I get him a sandwich and drink and will give pain rx and antibiotic afterward.

## 2017-05-08 NOTE — ED Provider Notes (Signed)
[facility] DEPT Provider Note   CSN: [id number] Arrival date & time: 05/08/17  0940  By signing my name below, I, Ephriam Jenkins, attest that this documentation has been prepared under the direction and in the presence of Virgel Manifold, MD. Electronically signed, Ephriam Jenkins, ED Scribe. 05/08/17. 10:36 AM.  History   Chief Complaint Chief Complaint  Patient presents with  . Otalgia    HPI HPI Comments: [name] [name] is a 51 y.o. male with Hx of CKD, DM, who presents to the Emergency Department complaining of persistent right ear pain that started 4 days ago. He was seen at [facility] ED on 05/06/17, two days ago and was given Rx for Keflex and Ciprodex ear drops. He states that he has been compliant with these medications so far. He has dialysis treatment T, Th, Sat. Today, he was in the middle of dialysis treatment when his right ear pain worsened and presented here after 2 hours of treatment. He reports muffled hearing from his right ear. Pt further states that he vomited last night due to the pain, he has not been nauseas today. Pt notes that he did not sleep well last night due to the pain. No drainage from the ear. No fever.  The history is provided by the patient. No language interpreter was used.    Past Medical History:  Diagnosis Date  . Allergic rhinitis   . Anemia   . Aortic insufficiency    a. Echo 7/16:  Mod LVH, EF 50-55%, mild to mod AI, severe LAE, PASP 57 mmHg (LV ID end diastolic 86.7 mm);  b. Echo 2/17:  Mod LVH, EF 50-55%, mod AI, MAC, mild MR, mod LAE, mild to mod TR, PASP 63 mmHg  . Bilateral carpal tunnel syndrome   . CHF (congestive heart failure) ([place])   . Chronic kidney disease    Kidney Transplant 2009 09/26/15- transplanated kidney failing  . Chronic kidney disease (CKD), stage IV (severe) ([place])   . Chronic renal allograft nephropathy   . Claustrophobia   . Complication of anesthesia    "they said something at [facility] it was affected by sleep aqpnea."    . COPD (chronic obstructive pulmonary disease) ([place])   . Diabetes mellitus    Type II  . Diabetic peripheral neuropathy ([place])   . Diabetic retinopathy ([place])   . Diminished eyesight   . Discitis of lumbar region    resolved  . DVT (deep venous thrombosis) ([place]) 10/07/2015   LLE  . Dyslipidemia   . Endocarditis   . Endocarditis 11/28/14  . Gait disorder   . Gastroparesis   . GERD (gastroesophageal reflux disease)   . Gout   . Hearing difficulty   . Hyperlipidemia   . Hypertension   . Incomplete bladder emptying   . Leg pain 02/05/11   with walking  . Morbid obesity ([place])   . Nervousness(799.21)   . Obstructive sleep apnea    not using CPAP- lost 100lbs  . Osteomyelitis ([place])   . Posttraumatic stress disorder   . Renal transplant, status post 04/05/2014  . Rotator cuff disorder   . Secondary hyperparathyroidism ([place])   . Sepsis ([place])    Postive blood cultures -   . Shortness of breath    when lying flat  . Urethral stricture   . Vitamin D deficiency   . Weight gain     Patient Active Problem List   Diagnosis Date Noted  . ESRD on dialysis ([place]) 11/15/2015  . CHF (  congestive heart failure) ([place]) 10/07/2015  . Prolonged Q-T interval on ECG 10/07/2015  . Chronic deep vein thrombosis (DVT) of left lower extremity ([place]) 10/07/2015  . Aortic insufficiency 04/12/2015  . Back pain at L4-L5 level   . Anemia due to chronic kidney disease   . Cervical radiculopathy   . Diabetic peripheral neuropathy ([place]) 10/16/2014  . Renal transplant, status post 04/05/2014  . OSA (obstructive sleep apnea) 04/05/2014  . Severe obesity (BMI >= 40) ([place]) 04/05/2014  . Benign fibroma of prostate 02/05/2014  . Diabetic macular edema ([place]) 01/15/2012  . Diabetes mellitus, type 2 ([place]) 01/15/2012  . IMMUNOCOMPROMISED 01/30/2010  . HYPERPARATHYROIDISM, SECONDARY 01/27/2010  . GOUT 01/27/2010  . INSOMNIA 01/27/2010  . HLD (hyperlipidemia) 05/25/2008  . Essential hypertension 05/25/2008     Past Surgical History:  Procedure Laterality Date  . ARTERIOVENOUS GRAFT PLACEMENT Bilateral "several"  . AV FISTULA PLACEMENT Bilateral 2015  . BASCILIC VEIN TRANSPOSITION Right 09/27/2015  . [place] Right 09/27/2015   Procedure: BASILIC VEIN TRANSPOSITION right;  Surgeon: Mal Misty, MD;  Location: [facility];  Service: Vascular;  Laterality: Right;  . CATARACT EXTRACTION W/ INTRAOCULAR LENS  IMPLANT, BILATERAL Bilateral   . CYSTOSCOPY W/ INTERNAL URETHROTOMY  09/2014  . KIDNEY TRANSPLANT  2009       Home Medications    Prior to Admission medications   Medication Sig Start Date End Date Taking? Authorizing Provider  ACCU-CHEK SMARTVIEW test strip USE FOUR TIMES DAILY AS DIRECTED TO CHECK BLOOD SUGAR 09/23/16   Elayne Snare, MD  BD PEN NEEDLE NANO U/F 32G X 4 MM MISC USE  5  PER  DAY AS DIRECTED 08/26/16   Elayne Snare, MD  calcium acetate (PHOSLO) 667 MG capsule Take 3 capsules (2,001 mg total) by mouth 3 (three) times daily with meals. 10/26/15   Ghimire, Henreitta Leber, MD  cephALEXin (KEFLEX) 500 MG capsule Take 1 capsule (500 mg total) by mouth 4 (four) times daily. 05/06/17   Mesner, Corene Cornea, MD  ciprofloxacin-dexamethasone (CIPRODEX) OTIC suspension Place 4 drops into the right ear 2 (two) times daily. 05/06/17 05/13/17  Mesner, Corene Cornea, MD  clonazePAM Bobbye [place]) 1 MG tablet  12/24/16   [provider]  cloNIDine (CATAPRES) 0.3 MG tablet TAKE 1 TABLET THREE TIMES DAILY 07/14/16   Hoyt Koch, MD  Febuxostat (ULORIC) 80 MG TABS Take 1 tablet (80 mg total) by mouth daily. 04/07/16   Hoyt Koch, MD  glucose blood (ACCU-CHEK GUIDE) test strip Check blood sugar four times daily 09/02/16   Elayne Snare, MD  HUMULIN R U-500 KWIKPEN 500 UNIT/ML kwikpen INJECT SUBCUTANEOUSLY 15 UNITS AT [place], 25 UNITS AT LUNCH AND 20 UNITS BEFORE SUPPER 04/21/17   Elayne Snare, MD  hydrocortisone cream 1 % Apply to affected area 2 times daily 11/29/16   Blanchie Dessert, MD  Insulin Pen Needle 32G X 8 MM MISC Use as directed 10/26/15   Jonetta Osgood, MD  labetalol (NORMODYNE) 100 MG tablet TAKE 1 TABLET THREE TIMES DAILY 03/20/16   Hoyt Koch, MD  Lancets (ACCU-CHEK MULTICLIX) lancets Check blood sugar four times daily 09/02/16   Elayne Snare, MD  metoprolol succinate (TOPROL-XL) 25 MG 24 hr tablet Take 1 tablet (25 mg total) by mouth daily. 02/12/17   Elayne Snare, MD  midodrine (PROAMATINE) 10 MG tablet Take 10 mg by mouth as needed (low blood pressure).  01/08/17   [provider]  RENVELA 800 MG tablet  02/02/17   [provider]  rosuvastatin (CRESTOR) 20 MG tablet TAKE 1 TABLET AT BEDTIME 03/18/17   Hoyt Koch, MD  sertraline (ZOLOFT) 50 MG tablet  11/27/16   [provider]  tadalafil (CIALIS) 5 MG tablet Take 1 tablet (5 mg total) by mouth daily. 05/11/16   Hoyt Koch, MD  TOUJEO SOLOSTAR 300 UNIT/ML SOPN INJECT  90 UNITS SUBCUTANEOUSLY TWICE DAILY 04/21/17   Elayne Snare, MD  traMADol (ULTRAM) 50 MG tablet Take 1 tablet (50 mg total) by mouth every 8 (eight) hours as needed. 12/02/15   Hoyt Koch, MD  VELTASSA 8.4 g packet  12/30/16   [provider]  warfarin (COUMADIN) 5 MG tablet Take 1 (5 mg) tablet daily. 02/15/17   Hoyt Koch, MD    Family History Family History  Problem Relation Age of Onset  . Hypertension Mother   . Diabetes Father     Social History Social History  Substance Use Topics  . Smoking status: Never Smoker  . Smokeless tobacco: Never Used  . Alcohol use No     Allergies   Pork-derived products; Ace inhibitors; and Hydrocodone   Review of Systems Review of Systems   A complete 10 system review of systems was obtained and all systems are negative except as noted in the HPI and PMH.   Physical Exam Updated Vital Signs BP 139/76 (BP Location: Left Arm)   Pulse 90   Temp 97.6 F (36.4 C) (Oral)   Resp 18   SpO2 (!) 66%    Physical Exam  Constitutional: He is oriented to person, place, and time. He appears well-developed and well-nourished.  HENT:  Head: Normocephalic and atraumatic.  Right external auditory canal is clear. Purulent appearing effusion right TM, dull. Pain with manipulation of the pinna. Mild TTP just anterior to the tragus, no swelling. No proptosis. No mastoid tenderness.   Eyes: EOM are normal.  Neck: Normal range of motion.  Cardiovascular: Normal rate, regular rhythm, normal heart sounds and intact distal pulses.   Pulmonary/Chest: Effort normal and breath sounds normal. No respiratory distress.  Abdominal: Soft. He exhibits no distension. There is no tenderness.  Musculoskeletal: Normal range of motion.  Neurological: He is alert and oriented to person, place, and time.  Skin: Skin is warm and dry.  Psychiatric: He has a normal mood and affect. Judgment normal.  Nursing note and vitals reviewed.    ED Treatments / Results  DIAGNOSTIC STUDIES: Oxygen Saturation is 100% on RA, normal by my interpretation.  COORDINATION OF CARE: 10:24 AM-Discussed treatment plan with pt at bedside and pt agreed to plan.   Labs (all labs ordered are listed, but only abnormal results are displayed) Labs Reviewed - No data to display  EKG  EKG Interpretation None       Radiology No results found.  Procedures Procedures (including critical care time)  Medications Ordered in ED Medications - No data to display   Initial Impression / Assessment and Plan / ED Course  I have reviewed the triage vital signs and the nursing notes.  Pertinent labs & imaging results that were available during my care of the patient were reviewed by me and considered in my medical decision making (see chart for details).     51yM with otalgia. Currently this looks like OM to me. Ext aud canal looks fine but does have some pain with manipulation of pinna. Clinically not mastoiditis. He doesn't appear  acutely ill. Would like to cover for possible  pseudomonas with this immunocompromised status. Unfortunately has prolonged QT and on coumadin and can't use a quinolones. Will dose cefepime. With ESRD he'll be effectively be covered until he can be re-dosed at dialysis on Monday.   12:27 PM Discussed with Dr Jonnie Finner. He got a dose of cefepime in the ED today and will continue fortaz at dialysis this week. Will additionally prescribe pain medication.   Final Clinical Impressions(s) / ED Diagnoses   Final diagnoses:  Acute otitis media, unspecified otitis media type    New Prescriptions New Prescriptions   No medications on file    I personally preformed the services scribed in my presence. The recorded information has been reviewed is accurate. Virgel Manifold, MD.     Virgel Manifold, MD 05/16/17 [phone]

## 2017-05-08 NOTE — Discharge Instructions (Signed)
You got a dose of IV antibiotics in the ED today. You will be getting antibiotics at dialysis this upcoming week. Stop taking keflex (oral antibiotic). Continue using the antibiotic drops. Take pain medication as needed.

## 2017-05-08 NOTE — ED Triage Notes (Signed)
He c/o right earache x several days. He was seen at [facility] this week and was placed on Ciprodex ear drops. He states he was to see a "specialist" yesterday, "but he was on vacation and they told me I can't see him for two weeks". He also tells me he was in the midst of a dialysis treatment when his "ear started hurting too bad-I had to come here". He seems very unhappy, but I assure him we will take good care of him.

## 2017-05-10 ENCOUNTER — Encounter: Payer: Self-pay | Admitting: Internal Medicine

## 2017-05-10 ENCOUNTER — Ambulatory Visit (INDEPENDENT_AMBULATORY_CARE_PROVIDER_SITE_OTHER): Payer: Medicare Other | Admitting: Internal Medicine

## 2017-05-10 DIAGNOSIS — E1142 Type 2 diabetes mellitus with diabetic polyneuropathy: Secondary | ICD-10-CM

## 2017-05-10 NOTE — Progress Notes (Signed)
CC: Evaluation for Transport Chair - needs to lose wt to become a kidney transplant candidate

## 2017-05-10 NOTE — Patient Instructions (Signed)

## 2017-05-10 NOTE — Progress Notes (Signed)
Subjective:  Patient ID: [name] [name], male    DOB: [birth year]  Age: 51 y.o. MRN: [id number]  CC: No chief complaint on file.  New to me - he comes in today requesting that I get him approved for a new devices to help with mobility and transfers  HPI [name] [name] presents for f/up - Patient suffers from Severe Obesity, Poor Circulation is exhibited in bilateral lower extremities, Peripheral Neuropathy due to Type 2 Diabetes, Chronic DVT of left lower extremity, and LLE weakness with incoordination that impairs their ability to perform daily activities like ambulating safely, bathing, and any activity that requires a steady gait in the home. A walking cane or 2 wheeled walker will not resolve the issue with performing activities of daily living. A wheelchair will not assist the patient with loosing weight. Patient is not able to propel themselves in the home using a standard weight wheelchair due to weakness, size and weight. Patient can self propel in the Duet Rollator/Transport Chair and rest when needed. This device will allow the patient to reach their goal of losing weight and regaining strength.   Outpatient Medications Prior to Visit  Medication Sig Dispense Refill  . ACCU-CHEK SMARTVIEW test strip USE FOUR TIMES DAILY AS DIRECTED TO CHECK BLOOD SUGAR 400 each 0  . BD PEN NEEDLE NANO U/F 32G X 4 MM MISC USE  5  PER  DAY AS DIRECTED 450 each 1  . calcium acetate (PHOSLO) 667 MG capsule Take 3 capsules (2,001 mg total) by mouth 3 (three) times daily with meals. 270 capsule 0  . ciprofloxacin-dexamethasone (CIPRODEX) OTIC suspension Place 4 drops into the right ear 2 (two) times daily. 7.5 mL 0  . clonazePAM (KLONOPIN) 1 MG tablet     . cloNIDine (CATAPRES) 0.3 MG tablet TAKE 1 TABLET THREE TIMES DAILY 270 tablet 1  . Febuxostat (ULORIC) 80 MG TABS Take 1 tablet (80 mg total) by mouth daily. 90 tablet 2  . glucose blood (ACCU-CHEK GUIDE) test strip Check blood sugar four times daily 200  each 12  . HUMULIN R U-500 KWIKPEN 500 UNIT/ML kwikpen INJECT SUBCUTANEOUSLY 15 UNITS AT BREAKFAST, 25 UNITS AT LUNCH AND 20 UNITS BEFORE SUPPER (Patient taking differently: 25-30 Units 1-2 times daily) 6 mL 0  . hydrocortisone cream 1 % Apply to affected area 2 times daily 15 g 0  . Insulin Pen Needle 32G X 8 MM MISC Use as directed 100 each 0  . labetalol (NORMODYNE) 100 MG tablet TAKE 1 TABLET THREE TIMES DAILY 270 tablet 2  . Lancets (ACCU-CHEK MULTICLIX) lancets Check blood sugar four times daily 200 each 12  . metoprolol succinate (TOPROL-XL) 25 MG 24 hr tablet Take 1 tablet (25 mg total) by mouth daily. 30 tablet 3  . midodrine (PROAMATINE) 10 MG tablet Take 10 mg by mouth as needed (low blood pressure).     Marland Kitchen oxyCODONE-acetaminophen (PERCOCET/ROXICET) 5-325 MG tablet Take 1-2 tablets by mouth every 6 (six) hours as needed for severe pain. 10 tablet 0  . RENVELA 800 MG tablet Take 2,400 mg by mouth 3 (three) times daily with meals.     . rosuvastatin (CRESTOR) 20 MG tablet TAKE 1 TABLET AT BEDTIME 90 tablet 2  . TOUJEO SOLOSTAR 300 UNIT/ML SOPN INJECT  90 UNITS SUBCUTANEOUSLY TWICE DAILY 27 pen 0  . traMADol (ULTRAM) 50 MG tablet Take 1 tablet (50 mg total) by mouth every 8 (eight) hours as needed. 30 tablet 0  . VELTASSA 8.4 g packet  Take 8.4 g by mouth daily.     Marland Kitchen warfarin (COUMADIN) 5 MG tablet Take 1 (5 mg) tablet daily. (Patient taking differently: Take 5-7.5 mg by mouth one time only at 6 PM. Take 1 (5 mg) tablet every day except for Wednesday & Saturday when you take 7.5mg  (1.5 tablets) once daily.) 105 tablet 1   No facility-administered medications prior to visit.     ROS Review of Systems  Constitutional: Negative for diaphoresis, fatigue and unexpected weight change.  HENT: Negative for trouble swallowing.   Eyes: Negative for visual disturbance.  Respiratory: Negative for chest tightness, shortness of breath and wheezing.   Cardiovascular: Negative for chest pain,  palpitations and leg swelling.  Gastrointestinal: Negative for abdominal pain, constipation, diarrhea, nausea and vomiting.  Endocrine: Negative.  Negative for polydipsia, polyphagia and polyuria.  Genitourinary: Negative.  Negative for difficulty urinating.  Musculoskeletal: Positive for gait problem. Negative for back pain and myalgias.  Skin: Negative.  Negative for rash.  Allergic/Immunologic: Negative.   Neurological: Positive for weakness. Negative for dizziness and numbness.  Hematological: Negative for adenopathy. Does not bruise/bleed easily.  Psychiatric/Behavioral: Negative.     Objective:  BP (!) 130/48 (BP Location: Left Arm, Patient Position: Sitting, Cuff Size: Normal)   Pulse 88   Temp 97.6 F (36.4 C) (Oral)   Ht 6' (1.829 m)   Wt 289 lb (131.1 kg)   SpO2 98%   BMI 39.20 kg/m   BP Readings from Last 3 Encounters:  05/10/17 (!) 130/48  05/08/17 (!) 133/44  05/06/17 (!) 126/40    Wt Readings from Last 3 Encounters:  05/10/17 289 lb (131.1 kg)  05/06/17 278 lb (126.1 kg)  02/19/17 278 lb 12.8 oz (126.5 kg)    Physical Exam  Constitutional: He is oriented to person, place, and time. No distress.  HENT:  Mouth/Throat: Oropharynx is clear and moist. No oropharyngeal exudate.  Eyes: Conjunctivae are normal. Right eye exhibits no discharge. Left eye exhibits no discharge. No scleral icterus.  Neck: Normal range of motion. Neck supple. No JVD present. No thyromegaly present.  Cardiovascular: Normal rate, regular rhythm and intact distal pulses.  Exam reveals no gallop.   No murmur heard. Diminished pulses in both feet  Pulmonary/Chest: Effort normal and breath sounds normal. No respiratory distress. He has no wheezes. He has no rales. He exhibits no tenderness.  Abdominal: Soft. Bowel sounds are normal. He exhibits no distension and no mass. There is no tenderness. There is no rebound and no guarding.  Musculoskeletal: Normal range of motion. He exhibits no  edema, tenderness or deformity.  Lymphadenopathy:    He has no cervical adenopathy.  Neurological: He is alert and oriented to person, place, and time. He displays abnormal reflex. He exhibits abnormal muscle tone. Coordination abnormal.  LLE weakness and incoordination  Skin: Skin is warm and dry. No rash noted. He is not diaphoretic. No erythema. No pallor.  Vitals reviewed.   Lab Results  Component Value Date   WBC 7.4 12/15/2016   HGB 12.2 (L) 12/15/2016   HCT 37.4 (L) 12/15/2016   PLT 191 12/15/2016   GLUCOSE 244 (H) 01/22/2017   CHOL 124 08/28/2016   TRIG 127.0 08/28/2016   HDL 31.80 (L) 08/28/2016   LDLCALC 67 08/28/2016   ALT 12 (L) 02/20/2016   AST 16 02/20/2016   NA 136 12/15/2016   K 4.6 12/15/2016   CL 95 (L) 12/15/2016   CREATININE 6.12 (H) 12/15/2016   BUN 32 (H) 12/15/2016  CO2 28 12/15/2016   TSH 3.02 05/11/2016   PSA 2.15 10/29/2014   INR 2.4 (A) 04/22/2017   HGBA1C 7.3 (H) 01/22/2017   MICROALBUR 232.9 (H) 06/19/2015    No results found.  Assessment & Plan:   Diagnoses and all orders for this visit:  Severe obesity (BMI >= 40) ([place]) -     DME Other see comment  Diabetic peripheral neuropathy ([place]) -     DME Other see comment   I am having Mr. Godown maintain his calcium acetate, Insulin Pen Needle, traMADol, labetalol, Febuxostat, cloNIDine, BD PEN NEEDLE NANO U/F, accu-chek multiclix, glucose blood, ACCU-CHEK SMARTVIEW, hydrocortisone cream, metoprolol succinate, midodrine, clonazePAM, VELTASSA, RENVELA, warfarin, rosuvastatin, TOUJEO SOLOSTAR, HUMULIN R U-500 KWIKPEN, ciprofloxacin-dexamethasone, and oxyCODONE-acetaminophen.  No orders of the defined types were placed in this encounter.    Follow-up: Return in about 3 months (around 08/10/2017).  Scarlette Calico, MD

## 2017-05-11 DIAGNOSIS — H61329 Acquired stenosis of external ear canal secondary to inflammation and infection, unspecified ear: Secondary | ICD-10-CM | POA: Diagnosis not present

## 2017-05-11 DIAGNOSIS — N186 End stage renal disease: Secondary | ICD-10-CM | POA: Diagnosis not present

## 2017-05-11 DIAGNOSIS — D631 Anemia in chronic kidney disease: Secondary | ICD-10-CM | POA: Diagnosis not present

## 2017-05-11 DIAGNOSIS — E1129 Type 2 diabetes mellitus with other diabetic kidney complication: Secondary | ICD-10-CM | POA: Diagnosis not present

## 2017-05-11 DIAGNOSIS — E1122 Type 2 diabetes mellitus with diabetic chronic kidney disease: Secondary | ICD-10-CM | POA: Diagnosis not present

## 2017-05-11 DIAGNOSIS — N2581 Secondary hyperparathyroidism of renal origin: Secondary | ICD-10-CM | POA: Diagnosis not present

## 2017-05-11 DIAGNOSIS — Z992 Dependence on renal dialysis: Secondary | ICD-10-CM | POA: Diagnosis not present

## 2017-05-12 ENCOUNTER — Ambulatory Visit: Payer: Self-pay | Admitting: Internal Medicine

## 2017-05-13 DIAGNOSIS — D509 Iron deficiency anemia, unspecified: Secondary | ICD-10-CM | POA: Diagnosis not present

## 2017-05-13 DIAGNOSIS — Z23 Encounter for immunization: Secondary | ICD-10-CM | POA: Diagnosis not present

## 2017-05-13 DIAGNOSIS — N2581 Secondary hyperparathyroidism of renal origin: Secondary | ICD-10-CM | POA: Diagnosis not present

## 2017-05-13 DIAGNOSIS — N186 End stage renal disease: Secondary | ICD-10-CM | POA: Diagnosis not present

## 2017-05-13 DIAGNOSIS — I82492 Acute embolism and thrombosis of other specified deep vein of left lower extremity: Secondary | ICD-10-CM | POA: Diagnosis not present

## 2017-05-13 DIAGNOSIS — E1129 Type 2 diabetes mellitus with other diabetic kidney complication: Secondary | ICD-10-CM | POA: Diagnosis not present

## 2017-05-13 DIAGNOSIS — E1122 Type 2 diabetes mellitus with diabetic chronic kidney disease: Secondary | ICD-10-CM | POA: Diagnosis not present

## 2017-05-13 DIAGNOSIS — H61329 Acquired stenosis of external ear canal secondary to inflammation and infection, unspecified ear: Secondary | ICD-10-CM | POA: Diagnosis not present

## 2017-05-15 DIAGNOSIS — H61329 Acquired stenosis of external ear canal secondary to inflammation and infection, unspecified ear: Secondary | ICD-10-CM | POA: Diagnosis not present

## 2017-05-15 DIAGNOSIS — D509 Iron deficiency anemia, unspecified: Secondary | ICD-10-CM | POA: Diagnosis not present

## 2017-05-15 DIAGNOSIS — E1129 Type 2 diabetes mellitus with other diabetic kidney complication: Secondary | ICD-10-CM | POA: Diagnosis not present

## 2017-05-15 DIAGNOSIS — N186 End stage renal disease: Secondary | ICD-10-CM | POA: Diagnosis not present

## 2017-05-15 DIAGNOSIS — E1122 Type 2 diabetes mellitus with diabetic chronic kidney disease: Secondary | ICD-10-CM | POA: Diagnosis not present

## 2017-05-17 DIAGNOSIS — N2581 Secondary hyperparathyroidism of renal origin: Secondary | ICD-10-CM | POA: Diagnosis not present

## 2017-05-17 DIAGNOSIS — N186 End stage renal disease: Secondary | ICD-10-CM | POA: Diagnosis not present

## 2017-05-17 DIAGNOSIS — E8770 Fluid overload, unspecified: Secondary | ICD-10-CM | POA: Diagnosis not present

## 2017-05-18 DIAGNOSIS — H61329 Acquired stenosis of external ear canal secondary to inflammation and infection, unspecified ear: Secondary | ICD-10-CM | POA: Diagnosis not present

## 2017-05-18 DIAGNOSIS — N186 End stage renal disease: Secondary | ICD-10-CM | POA: Diagnosis not present

## 2017-05-18 DIAGNOSIS — E1129 Type 2 diabetes mellitus with other diabetic kidney complication: Secondary | ICD-10-CM | POA: Diagnosis not present

## 2017-05-18 DIAGNOSIS — D509 Iron deficiency anemia, unspecified: Secondary | ICD-10-CM | POA: Diagnosis not present

## 2017-05-18 DIAGNOSIS — E1122 Type 2 diabetes mellitus with diabetic chronic kidney disease: Secondary | ICD-10-CM | POA: Diagnosis not present

## 2017-05-19 ENCOUNTER — Encounter: Payer: Self-pay | Admitting: Family Medicine

## 2017-05-19 ENCOUNTER — Other Ambulatory Visit (INDEPENDENT_AMBULATORY_CARE_PROVIDER_SITE_OTHER): Payer: Medicare Other

## 2017-05-19 ENCOUNTER — Encounter: Payer: Medicare Other | Admitting: Endocrinology

## 2017-05-19 ENCOUNTER — Encounter: Payer: Self-pay | Admitting: Endocrinology

## 2017-05-19 ENCOUNTER — Ambulatory Visit (INDEPENDENT_AMBULATORY_CARE_PROVIDER_SITE_OTHER): Payer: Medicare Other | Admitting: Family Medicine

## 2017-05-19 VITALS — BP 140/70 | HR 110 | Temp 97.4°F | Ht 72.0 in | Wt 279.0 lb

## 2017-05-19 VITALS — BP 144/76 | HR 94 | Ht 72.0 in | Wt 285.8 lb

## 2017-05-19 DIAGNOSIS — H9201 Otalgia, right ear: Secondary | ICD-10-CM | POA: Diagnosis not present

## 2017-05-19 DIAGNOSIS — Z794 Long term (current) use of insulin: Secondary | ICD-10-CM

## 2017-05-19 DIAGNOSIS — E1165 Type 2 diabetes mellitus with hyperglycemia: Secondary | ICD-10-CM | POA: Diagnosis not present

## 2017-05-19 LAB — HEMOGLOBIN A1C: Hgb A1c MFr Bld: 7.2 % — ABNORMAL HIGH (ref 4.6–6.5)

## 2017-05-19 LAB — GLUCOSE, RANDOM: Glucose, Bld: 217 mg/dL — ABNORMAL HIGH (ref 70–99)

## 2017-05-19 MED ORDER — OFLOXACIN 0.3 % OT SOLN: 10.0000 [drp] | Freq: Every day | OTIC | 0 refills | Status: DC

## 2017-05-19 NOTE — Assessment & Plan Note (Addendum)
He was diagnosed with otitis media recently in the emergency department and has been placed on a couple of different antibiotics. Unclear if this is a ruptured tympanic membrane or otitis externa. Unable to visualize tympanic membranes today with what looks like possible pus formation within the ear canal. - oxfloxacin drops - given indications to return.

## 2017-05-19 NOTE — Patient Instructions (Signed)
Thank you for coming in,   Please follow-up with me if you do not have any improvement in your ear pain.   Please feel free to call with any questions or concerns at any time, at [phone]. --Dr. Raeford Razor

## 2017-05-19 NOTE — Progress Notes (Signed)
This encounter was created in error - please disregard.

## 2017-05-19 NOTE — Progress Notes (Signed)
[name] [name] - 51 y.o. male MRN [id number]  Date of birth: 23-Oct-1965  SUBJECTIVE:  Including CC & ROS.  Chief Complaint  Patient presents with  . Ear Pain    started last monday, States after ear was lavaged last monday he cannot hear anything with slight pain    Mr. Alverio Is a 51 year old male is presenting with right ear pain and changes in hearing. He was seen on July 28 in the emergency department and diagnosed with acute otitis media. He denies any fevers or chills. He was started on cefepime in the ED and provided fortaz. He reports he has not been able to hear well out of this ear since being seen in the emergency room. Reports a history of ear problems before. He      Review of Systems  Constitutional: Negative for fever.  HENT: Positive for ear pain and hearing loss.   Musculoskeletal: Positive for gait problem.  Skin: Negative for rash.  otherwise negative   HISTORY: Past Medical, Surgical, Social, and Family History Reviewed & Updated per EMR.   Pertinent Historical Findings include:  Past Medical History:  Diagnosis Date  . Allergic rhinitis   . Anemia   . Aortic insufficiency    a. Echo 7/16:  Mod LVH, EF 50-55%, mild to mod AI, severe LAE, PASP 57 mmHg (LV ID end diastolic 50.0 mm);  b. Echo 2/17:  Mod LVH, EF 50-55%, mod AI, MAC, mild MR, mod LAE, mild to mod TR, PASP 63 mmHg  . Bilateral carpal tunnel syndrome   . CHF (congestive heart failure) ([place])   . Chronic kidney disease    Kidney Transplant 2009 09/26/15- transplanated kidney failing  . Chronic kidney disease (CKD), stage IV (severe) ([place])   . Chronic renal allograft nephropathy   . Claustrophobia   . Complication of anesthesia    "they said something at [facility] it was affected by sleep aqpnea."  . COPD (chronic obstructive pulmonary disease) ([place])   . Diabetes mellitus    Type II  . Diabetic peripheral neuropathy ([place])   . Diabetic retinopathy ([place])   . Diminished eyesight   . Discitis of  lumbar region    resolved  . DVT (deep venous thrombosis) ([place]) 10/07/2015   LLE  . Dyslipidemia   . Endocarditis   . Endocarditis 11/28/14  . Gait disorder   . Gastroparesis   . GERD (gastroesophageal reflux disease)   . Gout   . Hearing difficulty   . Hyperlipidemia   . Hypertension   . Incomplete bladder emptying   . Leg pain 02/05/11   with walking  . Morbid obesity ([place])   . Nervousness(799.21)   . Obstructive sleep apnea    not using CPAP- lost 100lbs  . Osteomyelitis ([place])   . Posttraumatic stress disorder   . Renal transplant, status post 04/05/2014  . Rotator cuff disorder   . Secondary hyperparathyroidism ([place])   . Sepsis ([place])    [place] blood cultures -   . Shortness of breath    when lying flat  . Urethral stricture   . Vitamin D deficiency   . Weight gain     Past Surgical History:  Procedure Laterality Date  . ARTERIOVENOUS GRAFT PLACEMENT Bilateral "several"  . AV FISTULA PLACEMENT Bilateral 2015  . BASCILIC VEIN TRANSPOSITION Right 09/27/2015  . [place] Right 09/27/2015   Procedure: BASILIC VEIN TRANSPOSITION right;  Surgeon: Mal Misty, MD;  Location: [facility];  Service: Vascular;  Laterality: Right;  . CATARACT EXTRACTION W/ INTRAOCULAR LENS  IMPLANT, BILATERAL Bilateral   . CYSTOSCOPY W/ INTERNAL URETHROTOMY  09/2014  . KIDNEY TRANSPLANT  2009    Allergies  Allergen Reactions  . Pork-Derived Products Anaphylaxis and Other (See Comments)    Fever also   . Ace Inhibitors Cough  . Hydrocodone Other (See Comments)    Hallucinations    Family History  Problem Relation Age of Onset  . Hypertension Mother   . Diabetes Father      Social History   Social History  . Marital status: Divorced    Spouse name: N/A  . Number of children: 0  . Years of education: College    Occupational History  .  Unemployed   Social History Main Topics  . Smoking status: Never Smoker  . Smokeless tobacco: Never Used  . Alcohol use  No  . Drug use: No  . Sexual activity: Not Currently   Other Topics Concern  . Not on file   Social History Narrative   Patient is Divorced.   Patient does not have any children.   Patient is right-handed   Patient is currently in college working on his Ph.D.           PHYSICAL EXAM:  VS: BP 140/70 (BP Location: Left Arm, Patient Position: Sitting, Cuff Size: Large)   Pulse (!) 110   Temp (!) 97.4 F (36.3 C) (Oral)   Ht 6' (1.829 m)   Wt 279 lb (126.6 kg)   SpO2 97%   BMI 37.84 kg/m  Physical Exam  Constitutional: He is oriented to person, place, and time. He appears well-developed and well-nourished.  HENT:  Head: Normocephalic and atraumatic.  Normal left TM  Unable to visuliaze the right TM. Possible pus within the ear canal.  No pain with palpation of the tragus. No periauricular cellulitis   Eyes: Conjunctivae and EOM are normal.  Neck: Normal range of motion. Neck supple.  Cardiovascular: Regular rhythm and normal heart sounds.   Pulmonary/Chest: Effort normal and breath sounds normal.  Neurological: He is alert and oriented to person, place, and time.  Skin: Skin is warm and dry.  Psychiatric: He has a normal mood and affect. His behavior is normal.       ASSESSMENT & PLAN:   Right ear pain He was diagnosed with otitis media recently in the emergency department and has been placed on a couple of different antibiotics. Unclear if this is a ruptured tympanic membrane or otitis externa. Unable to visualize tympanic membranes today with what looks like possible pus formation within the ear canal. - oxfloxacin drops - given indications to return.

## 2017-05-20 DIAGNOSIS — I82492 Acute embolism and thrombosis of other specified deep vein of left lower extremity: Secondary | ICD-10-CM | POA: Diagnosis not present

## 2017-05-20 DIAGNOSIS — H61329 Acquired stenosis of external ear canal secondary to inflammation and infection, unspecified ear: Secondary | ICD-10-CM | POA: Diagnosis not present

## 2017-05-20 DIAGNOSIS — D509 Iron deficiency anemia, unspecified: Secondary | ICD-10-CM | POA: Diagnosis not present

## 2017-05-20 DIAGNOSIS — E1122 Type 2 diabetes mellitus with diabetic chronic kidney disease: Secondary | ICD-10-CM | POA: Diagnosis not present

## 2017-05-20 DIAGNOSIS — E1129 Type 2 diabetes mellitus with other diabetic kidney complication: Secondary | ICD-10-CM | POA: Diagnosis not present

## 2017-05-20 DIAGNOSIS — N186 End stage renal disease: Secondary | ICD-10-CM | POA: Diagnosis not present

## 2017-05-20 LAB — FRUCTOSAMINE: Fructosamine: 408 umol/L — ABNORMAL HIGH (ref 0–285)

## 2017-05-21 DIAGNOSIS — Z794 Long term (current) use of insulin: Secondary | ICD-10-CM | POA: Diagnosis not present

## 2017-05-21 DIAGNOSIS — E119 Type 2 diabetes mellitus without complications: Secondary | ICD-10-CM | POA: Diagnosis not present

## 2017-05-21 DIAGNOSIS — H60331 Swimmer's ear, right ear: Secondary | ICD-10-CM | POA: Diagnosis not present

## 2017-05-21 DIAGNOSIS — Z9489 Other transplanted organ and tissue status: Secondary | ICD-10-CM | POA: Diagnosis not present

## 2017-05-22 DIAGNOSIS — H61329 Acquired stenosis of external ear canal secondary to inflammation and infection, unspecified ear: Secondary | ICD-10-CM | POA: Diagnosis not present

## 2017-05-22 DIAGNOSIS — E1129 Type 2 diabetes mellitus with other diabetic kidney complication: Secondary | ICD-10-CM | POA: Diagnosis not present

## 2017-05-22 DIAGNOSIS — E1122 Type 2 diabetes mellitus with diabetic chronic kidney disease: Secondary | ICD-10-CM | POA: Diagnosis not present

## 2017-05-22 DIAGNOSIS — D509 Iron deficiency anemia, unspecified: Secondary | ICD-10-CM | POA: Diagnosis not present

## 2017-05-22 DIAGNOSIS — N186 End stage renal disease: Secondary | ICD-10-CM | POA: Diagnosis not present

## 2017-05-24 DIAGNOSIS — E8770 Fluid overload, unspecified: Secondary | ICD-10-CM | POA: Diagnosis not present

## 2017-05-24 DIAGNOSIS — N2581 Secondary hyperparathyroidism of renal origin: Secondary | ICD-10-CM | POA: Diagnosis not present

## 2017-05-24 DIAGNOSIS — N186 End stage renal disease: Secondary | ICD-10-CM | POA: Diagnosis not present

## 2017-05-24 NOTE — Addendum Note (Signed)
Addended by: Aviva Signs M on: 05/24/2017 04:10 PM   Modules accepted: Orders

## 2017-05-25 DIAGNOSIS — E1129 Type 2 diabetes mellitus with other diabetic kidney complication: Secondary | ICD-10-CM | POA: Diagnosis not present

## 2017-05-25 DIAGNOSIS — N186 End stage renal disease: Secondary | ICD-10-CM | POA: Diagnosis not present

## 2017-05-25 DIAGNOSIS — D509 Iron deficiency anemia, unspecified: Secondary | ICD-10-CM | POA: Diagnosis not present

## 2017-05-25 DIAGNOSIS — H61329 Acquired stenosis of external ear canal secondary to inflammation and infection, unspecified ear: Secondary | ICD-10-CM | POA: Diagnosis not present

## 2017-05-25 DIAGNOSIS — E1122 Type 2 diabetes mellitus with diabetic chronic kidney disease: Secondary | ICD-10-CM | POA: Diagnosis not present

## 2017-05-25 DIAGNOSIS — I82492 Acute embolism and thrombosis of other specified deep vein of left lower extremity: Secondary | ICD-10-CM | POA: Diagnosis not present

## 2017-05-26 ENCOUNTER — Other Ambulatory Visit: Payer: Self-pay

## 2017-05-26 ENCOUNTER — Encounter: Payer: Self-pay | Admitting: Endocrinology

## 2017-05-26 ENCOUNTER — Ambulatory Visit (INDEPENDENT_AMBULATORY_CARE_PROVIDER_SITE_OTHER): Payer: Medicare Other | Admitting: Endocrinology

## 2017-05-26 VITALS — BP 130/68 | HR 111 | Ht 70.0 in | Wt 284.4 lb

## 2017-05-26 DIAGNOSIS — R000 Tachycardia, unspecified: Secondary | ICD-10-CM

## 2017-05-26 DIAGNOSIS — E1165 Type 2 diabetes mellitus with hyperglycemia: Secondary | ICD-10-CM

## 2017-05-26 DIAGNOSIS — Z794 Long term (current) use of insulin: Secondary | ICD-10-CM

## 2017-05-26 LAB — POCT INR: INR: 3.5 — AB (ref 0.9–1.1)

## 2017-05-26 MED ORDER — METOPROLOL SUCCINATE ER 25 MG PO TB24
25.0000 mg | ORAL_TABLET | Freq: Every day | ORAL | 3 refills | Status: DC
Start: 2017-05-26 — End: 2018-02-28

## 2017-05-26 MED ORDER — INSULIN REGULAR HUMAN (CONC) 500 UNIT/ML ~~LOC~~ SOPN: PEN_INJECTOR | SUBCUTANEOUS | 3 refills | Status: DC

## 2017-05-26 MED ORDER — INSULIN GLARGINE 300 UNIT/ML ~~LOC~~ SOPN: 80.0000 [IU] | PEN_INJECTOR | Freq: Two times a day (BID) | SUBCUTANEOUS | 3 refills | Status: DC

## 2017-05-26 NOTE — Progress Notes (Signed)
Patient ID: [name] [name], male   DOB: 04/03/1966, 51 y.o.   MRN: [id number]           Reason for Appointment: Follow-up for Type 2 Diabetes  History of Present Illness:          Date of diagnosis of type 2 diabetes mellitus : 1990       Background history:  He has been taking insulin since about 1999, previously was on unknown oral agents He had good control with insulin initially but A1c was much higher in 2008 and subsequently has had required larger doses of insulin He has been on Lantus for the last few years along with Novolog,followed by an endocrinologist since 2008 His A1c was 6.3 in 04/2014   Prior to his consultation he was on a regimen of Novolog , 50 units 5 times a day with each meal and snack along with 90 Units 3 times a day of Lantus   Recent history:   INSULIN regimen is :U-500 insulin with KwikPen 30 before bfst, 30 before lunch and 30 before dinner, take 30 min before  Sliding scale use for high sugars: Sugar 150-199 take 2 units, 200-250= 4 units and over 250 take 6 units   TOUJEO 90 units twice daily  A1c is about the same at 7.2 However fructosamine is much higher at 408  Current blood sugar patterns and problems identified:  He appears to be checking his blood sugars much less often than he had indicated previously and on an average less than once a day  Blood sugars are being checked more in the mornings and sporadically later in the day and difficult to establish pattern  He thinks for the last month he has taken more Humulin R insulin since his sugars were high but he still has mostly high readings in the afternoons and evenings and had better readings only twice in the evening last month  Not clear why he is having higher blood sugars, he has had some problems with ear infections lately  Although he thinks he is very consistent with taking his mealtime insulin before he starts cooking his meals he has occasional significantly high reading later at  night  Once had a low normal sugar of 62 after taking extra 15 units for high reading at bedtime  He thinks he is generally eating low fat meals and more salads  However appears to have gained weight since his last visit  Compliance with the medical regimen: usually good Hypoglycemia: As above  Glucose monitoring:  done 0-2 times a day         Glucometer:    Aviva      Blood Glucose readings by:  Mean values apply above for all meters except median for One Touch  PRE-MEAL Fasting Midday  Dinner Bedtime Overall  Glucose range:  78-209  282, 374  48-3 26   90-3 96    Mean/median: 140     171+/-100    POST-MEAL PC Breakfast PC Lunch Overnight   Glucose range:    53-1 83   Mean/median:        Self-care: The diet that the patient has been following is: tries to limit high-fat meals, usually eating  2-3 meals a day.     Typical meal intake: Breakfast is grits with pecans, olive oil. Dinner 7 pm               Dietician visit, most recent:2013  Exercise:  unable to do any, In wheelchair  Weight history:  Wt Readings from Last 3 Encounters:  05/26/17 284 lb 6.4 oz (129 kg)  05/19/17 285 lb 12.8 oz (129.6 kg)  05/19/17 279 lb (126.6 kg)    Glycemic control:   Lab Results  Component Value Date   HGBA1C 7.2 (H) 05/19/2017   HGBA1C 7.3 (H) 01/22/2017   HGBA1C 7.1 (H) 08/28/2016   Lab Results  Component Value Date   MICROALBUR 232.9 (H) 06/19/2015   LDLCALC 67 08/28/2016   CREATININE 6.12 (H) 12/15/2016    Lab Results  Component Value Date   FRUCTOSAMINE 408 (H) 05/19/2017   FRUCTOSAMINE 361 (H) 10/26/2016   FRUCTOSAMINE 298 (H) 11/22/2015      Allergies as of 05/26/2017      Reactions   Pork-derived Products Anaphylaxis, Other (See Comments)   Fever also    Ace Inhibitors Cough   Hydrocodone Other (See Comments)   Hallucinations      Medication List       Accurate as of 05/26/17 10:20 AM. Always use your most recent med list.           accu-chek multiclix lancets Check blood sugar four times daily   calcium acetate 667 MG capsule Commonly known as:  PHOSLO Take 3 capsules (2,001 mg total) by mouth 3 (three) times daily with meals.   clonazePAM 1 MG tablet Commonly known as:  KLONOPIN   cloNIDine 0.3 MG tablet Commonly known as:  CATAPRES TAKE 1 TABLET THREE TIMES DAILY   Febuxostat 80 MG Tabs Commonly known as:  ULORIC Take 1 tablet (80 mg total) by mouth daily.   glucose blood test strip Commonly known as:  ACCU-CHEK GUIDE Check blood sugar four times daily   ACCU-CHEK SMARTVIEW test strip Generic drug:  glucose blood USE FOUR TIMES DAILY AS DIRECTED TO CHECK BLOOD SUGAR   HUMULIN R U-500 KWIKPEN 500 UNIT/ML kwikpen Generic drug:  insulin regular human CONCENTRATED INJECT SUBCUTANEOUSLY 15 UNITS AT BREAKFAST, 25 UNITS AT LUNCH AND 20 UNITS BEFORE SUPPER   hydrocortisone cream 1 % Apply to affected area 2 times daily   Insulin Pen Needle 32G X 8 MM Misc Use as directed   BD PEN NEEDLE NANO U/F 32G X 4 MM Misc Generic drug:  Insulin Pen Needle USE  5  PER  DAY AS DIRECTED   labetalol 100 MG tablet Commonly known as:  NORMODYNE TAKE 1 TABLET THREE TIMES DAILY   metoprolol succinate 25 MG 24 hr tablet Commonly known as:  TOPROL-XL Take 1 tablet (25 mg total) by mouth daily.   midodrine 10 MG tablet Commonly known as:  PROAMATINE Take 10 mg by mouth as needed (low blood pressure).   ofloxacin 0.3 % OTIC solution Commonly known as:  FLOXIN Place 10 drops into the right ear daily. For a total of 7 days   oxyCODONE-acetaminophen 5-325 MG tablet Commonly known as:  PERCOCET/ROXICET Take 1-2 tablets by mouth every 6 (six) hours as needed for severe pain.   RENVELA 800 MG tablet Generic drug:  sevelamer carbonate Take 2,400 mg by mouth 3 (three) times daily with meals.   rosuvastatin 20 MG tablet Commonly known as:  CRESTOR TAKE 1 TABLET AT BEDTIME   TOUJEO SOLOSTAR 300 UNIT/ML  Sopn Generic drug:  Insulin Glargine INJECT  90 UNITS SUBCUTANEOUSLY TWICE DAILY   traMADol 50 MG tablet Commonly known as:  ULTRAM Take 1 tablet (50 mg total) by mouth every 8 (eight) hours  as needed.   VELTASSA 8.4 g packet Generic drug:  patiromer Take 8.4 g by mouth daily.   warfarin 5 MG tablet Commonly known as:  COUMADIN Take 1 (5 mg) tablet daily.       Allergies:  Allergies  Allergen Reactions  . Pork-Derived Products Anaphylaxis and Other (See Comments)    Fever also   . Ace Inhibitors Cough  . Hydrocodone Other (See Comments)    Hallucinations    Past Medical History:  Diagnosis Date  . Allergic rhinitis   . Anemia   . Aortic insufficiency    a. Echo 7/16:  Mod LVH, EF 50-55%, mild to mod AI, severe LAE, PASP 57 mmHg (LV ID end diastolic 57.0 mm);  b. Echo 2/17:  Mod LVH, EF 50-55%, mod AI, MAC, mild MR, mod LAE, mild to mod TR, PASP 63 mmHg  . Bilateral carpal tunnel syndrome   . CHF (congestive heart failure) ([place])   . Chronic kidney disease    Kidney Transplant 2009 09/26/15- transplanated kidney failing  . Chronic kidney disease (CKD), stage IV (severe) ([place])   . Chronic renal allograft nephropathy   . Claustrophobia   . Complication of anesthesia    "they said something at [facility] it was affected by sleep aqpnea."  . COPD (chronic obstructive pulmonary disease) ([place])   . Diabetes mellitus    Type II  . Diabetic peripheral neuropathy ([place])   . Diabetic retinopathy ([place])   . Diminished eyesight   . Discitis of lumbar region    resolved  . DVT (deep venous thrombosis) ([place]) 10/07/2015   LLE  . Dyslipidemia   . Endocarditis   . Endocarditis 11/28/14  . Gait disorder   . Gastroparesis   . GERD (gastroesophageal reflux disease)   . Gout   . Hearing difficulty   . Hyperlipidemia   . Hypertension   . Incomplete bladder emptying   . Leg pain 02/05/11   with walking  . Morbid obesity ([place])   . Nervousness(799.21)   . Obstructive sleep  apnea    not using CPAP- lost 100lbs  . Osteomyelitis ([place])   . Posttraumatic stress disorder   . Renal transplant, status post 04/05/2014  . Rotator cuff disorder   . Secondary hyperparathyroidism ([place])   . Sepsis ([place])    Postive blood cultures -   . Shortness of breath    when lying flat  . Urethral stricture   . Vitamin D deficiency   . Weight gain     Past Surgical History:  Procedure Laterality Date  . ARTERIOVENOUS GRAFT PLACEMENT Bilateral "several"  . AV FISTULA PLACEMENT Bilateral 2015  . BASCILIC VEIN TRANSPOSITION Right 09/27/2015  . [place] Right 09/27/2015   Procedure: BASILIC VEIN TRANSPOSITION right;  Surgeon: Mal Misty, MD;  Location: [facility];  Service: Vascular;  Laterality: Right;  . CATARACT EXTRACTION W/ INTRAOCULAR LENS  IMPLANT, BILATERAL Bilateral   . CYSTOSCOPY W/ INTERNAL URETHROTOMY  09/2014  . KIDNEY TRANSPLANT  2009    Family History  Problem Relation Age of Onset  . Hypertension Mother   . Diabetes Father     Social History:  reports that he has never smoked. He has never used smokeless tobacco. He reports that he does not drink alcohol or use drugs.    Review of Systems    ROS  He has been noticed to have rapid heart rate Thyroid levels normal He was told to start metoprolol on his last visit  but he has not done so  Lipid history: He is on 20 mg Crestor With the following results    Lab Results  Component Value Date   CHOL 124 08/28/2016   HDL 31.80 (L) 08/28/2016   LDLCALC 67 08/28/2016   TRIG 127.0 08/28/2016   CHOLHDL 4 08/28/2016            Hypertension: His blood pressure is Fairly good now He has been told by his nephrologist to stop his medications because of tendency lower sugars during dialysis  Previously on 0.3 mg dose of clonidine along with labetalol and is followed by nephrologist   RENAL: He has had a transplant in 2009 and  he is on dialysis  Neurological: Has has had  decreased  sensation in his feet, no burning Previously followed by neurologist, currently not on treatment  He has had marked decrease in his balance and also some weakness in his legs, using a power scooter  Last foot exam was in 02/2017: Absent monofilament sensation in his feet and toes and extending up to the middle of the lower leg on the left and up to the ankle on the right  LABS:  No visits with results within 1 Week(s) from this visit.  Latest known visit with results is:  Lab on 05/19/2017  Component Date Value Ref Range Status  . Fructosamine 05/19/2017 408* 0 - 285 umol/L Final   Comment: Published reference interval for apparently healthy subjects between age 29 and 39 is 69 - 285 umol/L and in a poorly controlled diabetic population is 228 - 563 umol/L with a mean of 396 umol/L.   Marland Kitchen Glucose, Bld 05/19/2017 217* 70 - 99 mg/dL Final  . Hgb A1c MFr Bld 05/19/2017 7.2* 4.6 - 6.5 % Final   Glycemic Control Guidelines for People with Diabetes:Non Diabetic:  <6%Goal of Therapy: <7%Additional Action Suggested:  >8%     Physical Examination:  BP 130/68   Pulse (!) 111   Ht _0  (1.778 m)   Wt 284 lb 6.4 oz (129 kg)   SpO2 95%   BMI 40.81 kg/m   Heartrate 108 with occasional ectopics    ASSESSMENT:  Diabetes type 2, long-standing with obesity See history of present illness for detailed discussion of his current management, blood sugar patterns and problems identified.  His blood sugars Are not well controlled as judged by his fructosamine of 408 His A1c is probably falsely low He is checking his blood sugars infrequently Most of his high readings appear to be during the daytime and better overnight He is currently taking relatively large doses of basal insulin and somewhat small amounts of his Humulin R  He does think that he is usually compliant with his insulin before meals Diet appears to be fairly good with low fat intake  LIPIDS: Needs follow-up on the next  visit  TACHYCARDIA: He will start metoprolol as directed previously, New prescription sent  HYPERTENSION: Followed by nephrologist, blood pressure is relatively better today  PLAN:   He will Need to start checking blood sugars much more frequently and bring his meter for download on each visit  Again discussed that if he is able to check 4 times a day he will be able to get the freestyle Libre  Most likely needs significantly higher doses of Humulin R at breakfast and lunch but not at suppertime  To have better efficacy of Toujeo in the daytime he will increase the dose to 100 units and reduce  evening dose to 90  He can use the higher capacity pen with 900 units per pen for convenience  Advised not to take any extra Humulin R at bedtime since it causes hypoglycemia  Also to check more readings after supper to help adjust his evening dosage  Needs follow-up with fructosamine in 2 months  Patient Instructions  Humulin R, 40 in am and 45 at lunch and 30  Toujeo 100 in am and 80 in pm  Check sugars 4x daily   Counseling time on subjects discussed in assessment and plan sections is over 50% of today's 25 minute visit   Yania Bogie 05/26/2017, 10:20 AM   Note: This office note was prepared with Estate agent. Any transcriptional errors that result from this process are unintentional.

## 2017-05-26 NOTE — Patient Instructions (Addendum)
Humulin R, 40 in am and 45 at lunch and 30  Toujeo 100 in am and 80 in pm  Check sugars 4x daily

## 2017-05-27 DIAGNOSIS — N186 End stage renal disease: Secondary | ICD-10-CM | POA: Diagnosis not present

## 2017-05-27 DIAGNOSIS — E1122 Type 2 diabetes mellitus with diabetic chronic kidney disease: Secondary | ICD-10-CM | POA: Diagnosis not present

## 2017-05-27 DIAGNOSIS — D509 Iron deficiency anemia, unspecified: Secondary | ICD-10-CM | POA: Diagnosis not present

## 2017-05-27 DIAGNOSIS — I82492 Acute embolism and thrombosis of other specified deep vein of left lower extremity: Secondary | ICD-10-CM | POA: Diagnosis not present

## 2017-05-27 DIAGNOSIS — E1129 Type 2 diabetes mellitus with other diabetic kidney complication: Secondary | ICD-10-CM | POA: Diagnosis not present

## 2017-05-27 DIAGNOSIS — H61329 Acquired stenosis of external ear canal secondary to inflammation and infection, unspecified ear: Secondary | ICD-10-CM | POA: Diagnosis not present

## 2017-05-29 DIAGNOSIS — D509 Iron deficiency anemia, unspecified: Secondary | ICD-10-CM | POA: Diagnosis not present

## 2017-05-29 DIAGNOSIS — N186 End stage renal disease: Secondary | ICD-10-CM | POA: Diagnosis not present

## 2017-05-29 DIAGNOSIS — H61329 Acquired stenosis of external ear canal secondary to inflammation and infection, unspecified ear: Secondary | ICD-10-CM | POA: Diagnosis not present

## 2017-05-29 DIAGNOSIS — E1122 Type 2 diabetes mellitus with diabetic chronic kidney disease: Secondary | ICD-10-CM | POA: Diagnosis not present

## 2017-05-29 DIAGNOSIS — E1129 Type 2 diabetes mellitus with other diabetic kidney complication: Secondary | ICD-10-CM | POA: Diagnosis not present

## 2017-05-31 ENCOUNTER — Encounter: Payer: Self-pay | Admitting: Internal Medicine

## 2017-05-31 NOTE — Progress Notes (Signed)
Abstracted and sent to scan  

## 2017-06-01 DIAGNOSIS — N186 End stage renal disease: Secondary | ICD-10-CM | POA: Diagnosis not present

## 2017-06-01 DIAGNOSIS — E1122 Type 2 diabetes mellitus with diabetic chronic kidney disease: Secondary | ICD-10-CM | POA: Diagnosis not present

## 2017-06-01 DIAGNOSIS — D509 Iron deficiency anemia, unspecified: Secondary | ICD-10-CM | POA: Diagnosis not present

## 2017-06-01 DIAGNOSIS — E1129 Type 2 diabetes mellitus with other diabetic kidney complication: Secondary | ICD-10-CM | POA: Diagnosis not present

## 2017-06-01 DIAGNOSIS — H61329 Acquired stenosis of external ear canal secondary to inflammation and infection, unspecified ear: Secondary | ICD-10-CM | POA: Diagnosis not present

## 2017-06-03 DIAGNOSIS — E1122 Type 2 diabetes mellitus with diabetic chronic kidney disease: Secondary | ICD-10-CM | POA: Diagnosis not present

## 2017-06-03 DIAGNOSIS — D509 Iron deficiency anemia, unspecified: Secondary | ICD-10-CM | POA: Diagnosis not present

## 2017-06-03 DIAGNOSIS — I82492 Acute embolism and thrombosis of other specified deep vein of left lower extremity: Secondary | ICD-10-CM | POA: Diagnosis not present

## 2017-06-03 DIAGNOSIS — M109 Gout, unspecified: Secondary | ICD-10-CM | POA: Diagnosis not present

## 2017-06-03 DIAGNOSIS — N186 End stage renal disease: Secondary | ICD-10-CM | POA: Diagnosis not present

## 2017-06-03 DIAGNOSIS — H61329 Acquired stenosis of external ear canal secondary to inflammation and infection, unspecified ear: Secondary | ICD-10-CM | POA: Diagnosis not present

## 2017-06-03 DIAGNOSIS — E1129 Type 2 diabetes mellitus with other diabetic kidney complication: Secondary | ICD-10-CM | POA: Diagnosis not present

## 2017-06-03 LAB — POCT INR: INR: 2 — AB (ref 0.9–1.1)

## 2017-06-03 LAB — PROTIME-INR: Protime: 22.8 — AB (ref 10.0–13.8)

## 2017-06-05 DIAGNOSIS — E1122 Type 2 diabetes mellitus with diabetic chronic kidney disease: Secondary | ICD-10-CM | POA: Diagnosis not present

## 2017-06-05 DIAGNOSIS — E1129 Type 2 diabetes mellitus with other diabetic kidney complication: Secondary | ICD-10-CM | POA: Diagnosis not present

## 2017-06-05 DIAGNOSIS — D509 Iron deficiency anemia, unspecified: Secondary | ICD-10-CM | POA: Diagnosis not present

## 2017-06-05 DIAGNOSIS — H61329 Acquired stenosis of external ear canal secondary to inflammation and infection, unspecified ear: Secondary | ICD-10-CM | POA: Diagnosis not present

## 2017-06-05 DIAGNOSIS — N186 End stage renal disease: Secondary | ICD-10-CM | POA: Diagnosis not present

## 2017-06-08 ENCOUNTER — Encounter: Payer: Self-pay | Admitting: Internal Medicine

## 2017-06-08 DIAGNOSIS — H61329 Acquired stenosis of external ear canal secondary to inflammation and infection, unspecified ear: Secondary | ICD-10-CM | POA: Diagnosis not present

## 2017-06-08 DIAGNOSIS — N186 End stage renal disease: Secondary | ICD-10-CM | POA: Diagnosis not present

## 2017-06-08 DIAGNOSIS — E1122 Type 2 diabetes mellitus with diabetic chronic kidney disease: Secondary | ICD-10-CM | POA: Diagnosis not present

## 2017-06-08 DIAGNOSIS — E1129 Type 2 diabetes mellitus with other diabetic kidney complication: Secondary | ICD-10-CM | POA: Diagnosis not present

## 2017-06-08 DIAGNOSIS — D509 Iron deficiency anemia, unspecified: Secondary | ICD-10-CM | POA: Diagnosis not present

## 2017-06-08 NOTE — Progress Notes (Signed)
Abstracted and sent to scan  

## 2017-06-10 DIAGNOSIS — E1122 Type 2 diabetes mellitus with diabetic chronic kidney disease: Secondary | ICD-10-CM | POA: Diagnosis not present

## 2017-06-10 DIAGNOSIS — E1129 Type 2 diabetes mellitus with other diabetic kidney complication: Secondary | ICD-10-CM | POA: Diagnosis not present

## 2017-06-10 DIAGNOSIS — H61329 Acquired stenosis of external ear canal secondary to inflammation and infection, unspecified ear: Secondary | ICD-10-CM | POA: Diagnosis not present

## 2017-06-10 DIAGNOSIS — D509 Iron deficiency anemia, unspecified: Secondary | ICD-10-CM | POA: Diagnosis not present

## 2017-06-10 DIAGNOSIS — N186 End stage renal disease: Secondary | ICD-10-CM | POA: Diagnosis not present

## 2017-06-10 DIAGNOSIS — I82492 Acute embolism and thrombosis of other specified deep vein of left lower extremity: Secondary | ICD-10-CM | POA: Diagnosis not present

## 2017-06-10 LAB — PROTIME-INR: Protime: 23.6 — AB (ref 10.0–13.8)

## 2017-06-10 LAB — POCT INR: INR: 2.1 — AB (ref 0.9–1.1)

## 2017-06-11 DIAGNOSIS — N186 End stage renal disease: Secondary | ICD-10-CM | POA: Diagnosis not present

## 2017-06-11 DIAGNOSIS — E1122 Type 2 diabetes mellitus with diabetic chronic kidney disease: Secondary | ICD-10-CM | POA: Diagnosis not present

## 2017-06-11 DIAGNOSIS — Z992 Dependence on renal dialysis: Secondary | ICD-10-CM | POA: Diagnosis not present

## 2017-06-12 DIAGNOSIS — E1122 Type 2 diabetes mellitus with diabetic chronic kidney disease: Secondary | ICD-10-CM | POA: Diagnosis not present

## 2017-06-12 DIAGNOSIS — N2581 Secondary hyperparathyroidism of renal origin: Secondary | ICD-10-CM | POA: Diagnosis not present

## 2017-06-12 DIAGNOSIS — E1129 Type 2 diabetes mellitus with other diabetic kidney complication: Secondary | ICD-10-CM | POA: Diagnosis not present

## 2017-06-12 DIAGNOSIS — N186 End stage renal disease: Secondary | ICD-10-CM | POA: Diagnosis not present

## 2017-06-15 DIAGNOSIS — N2581 Secondary hyperparathyroidism of renal origin: Secondary | ICD-10-CM | POA: Diagnosis not present

## 2017-06-15 DIAGNOSIS — E1129 Type 2 diabetes mellitus with other diabetic kidney complication: Secondary | ICD-10-CM | POA: Diagnosis not present

## 2017-06-15 DIAGNOSIS — N186 End stage renal disease: Secondary | ICD-10-CM | POA: Diagnosis not present

## 2017-06-15 DIAGNOSIS — E1122 Type 2 diabetes mellitus with diabetic chronic kidney disease: Secondary | ICD-10-CM | POA: Diagnosis not present

## 2017-06-16 ENCOUNTER — Encounter: Payer: Self-pay | Admitting: Internal Medicine

## 2017-06-16 NOTE — Progress Notes (Signed)
Abstracted and sent to scan  

## 2017-06-17 DIAGNOSIS — E1129 Type 2 diabetes mellitus with other diabetic kidney complication: Secondary | ICD-10-CM | POA: Diagnosis not present

## 2017-06-17 DIAGNOSIS — N2581 Secondary hyperparathyroidism of renal origin: Secondary | ICD-10-CM | POA: Diagnosis not present

## 2017-06-17 DIAGNOSIS — E1122 Type 2 diabetes mellitus with diabetic chronic kidney disease: Secondary | ICD-10-CM | POA: Diagnosis not present

## 2017-06-17 DIAGNOSIS — N186 End stage renal disease: Secondary | ICD-10-CM | POA: Diagnosis not present

## 2017-06-17 DIAGNOSIS — I82492 Acute embolism and thrombosis of other specified deep vein of left lower extremity: Secondary | ICD-10-CM | POA: Diagnosis not present

## 2017-06-18 LAB — PROTIME-INR: Protime: 26.1 — AB (ref 10.0–13.8)

## 2017-06-18 LAB — POCT INR: INR: 2.4 — AB (ref 0.9–1.1)

## 2017-06-19 DIAGNOSIS — E1129 Type 2 diabetes mellitus with other diabetic kidney complication: Secondary | ICD-10-CM | POA: Diagnosis not present

## 2017-06-19 DIAGNOSIS — E1122 Type 2 diabetes mellitus with diabetic chronic kidney disease: Secondary | ICD-10-CM | POA: Diagnosis not present

## 2017-06-19 DIAGNOSIS — N2581 Secondary hyperparathyroidism of renal origin: Secondary | ICD-10-CM | POA: Diagnosis not present

## 2017-06-19 DIAGNOSIS — N186 End stage renal disease: Secondary | ICD-10-CM | POA: Diagnosis not present

## 2017-06-22 DIAGNOSIS — E1129 Type 2 diabetes mellitus with other diabetic kidney complication: Secondary | ICD-10-CM | POA: Diagnosis not present

## 2017-06-22 DIAGNOSIS — N186 End stage renal disease: Secondary | ICD-10-CM | POA: Diagnosis not present

## 2017-06-22 DIAGNOSIS — E1122 Type 2 diabetes mellitus with diabetic chronic kidney disease: Secondary | ICD-10-CM | POA: Diagnosis not present

## 2017-06-22 DIAGNOSIS — N2581 Secondary hyperparathyroidism of renal origin: Secondary | ICD-10-CM | POA: Diagnosis not present

## 2017-06-23 ENCOUNTER — Encounter: Payer: Self-pay | Admitting: Internal Medicine

## 2017-06-23 DIAGNOSIS — N186 End stage renal disease: Secondary | ICD-10-CM | POA: Diagnosis not present

## 2017-06-23 DIAGNOSIS — N2581 Secondary hyperparathyroidism of renal origin: Secondary | ICD-10-CM | POA: Diagnosis not present

## 2017-06-23 DIAGNOSIS — E8779 Other fluid overload: Secondary | ICD-10-CM | POA: Diagnosis not present

## 2017-06-23 NOTE — Progress Notes (Signed)
Abstracted and sent to scan  

## 2017-06-24 DIAGNOSIS — N186 End stage renal disease: Secondary | ICD-10-CM | POA: Diagnosis not present

## 2017-06-24 DIAGNOSIS — N2581 Secondary hyperparathyroidism of renal origin: Secondary | ICD-10-CM | POA: Diagnosis not present

## 2017-06-24 DIAGNOSIS — E1129 Type 2 diabetes mellitus with other diabetic kidney complication: Secondary | ICD-10-CM | POA: Diagnosis not present

## 2017-06-24 DIAGNOSIS — E1122 Type 2 diabetes mellitus with diabetic chronic kidney disease: Secondary | ICD-10-CM | POA: Diagnosis not present

## 2017-06-26 DIAGNOSIS — N2581 Secondary hyperparathyroidism of renal origin: Secondary | ICD-10-CM | POA: Diagnosis not present

## 2017-06-26 DIAGNOSIS — E1122 Type 2 diabetes mellitus with diabetic chronic kidney disease: Secondary | ICD-10-CM | POA: Diagnosis not present

## 2017-06-26 DIAGNOSIS — N186 End stage renal disease: Secondary | ICD-10-CM | POA: Diagnosis not present

## 2017-06-26 DIAGNOSIS — E1129 Type 2 diabetes mellitus with other diabetic kidney complication: Secondary | ICD-10-CM | POA: Diagnosis not present

## 2017-06-29 DIAGNOSIS — N2581 Secondary hyperparathyroidism of renal origin: Secondary | ICD-10-CM | POA: Diagnosis not present

## 2017-06-29 DIAGNOSIS — N186 End stage renal disease: Secondary | ICD-10-CM | POA: Diagnosis not present

## 2017-06-29 DIAGNOSIS — E1122 Type 2 diabetes mellitus with diabetic chronic kidney disease: Secondary | ICD-10-CM | POA: Diagnosis not present

## 2017-06-29 DIAGNOSIS — E1129 Type 2 diabetes mellitus with other diabetic kidney complication: Secondary | ICD-10-CM | POA: Diagnosis not present

## 2017-07-01 DIAGNOSIS — E1129 Type 2 diabetes mellitus with other diabetic kidney complication: Secondary | ICD-10-CM | POA: Diagnosis not present

## 2017-07-01 DIAGNOSIS — E1122 Type 2 diabetes mellitus with diabetic chronic kidney disease: Secondary | ICD-10-CM | POA: Diagnosis not present

## 2017-07-01 DIAGNOSIS — I82492 Acute embolism and thrombosis of other specified deep vein of left lower extremity: Secondary | ICD-10-CM | POA: Diagnosis not present

## 2017-07-01 DIAGNOSIS — N2581 Secondary hyperparathyroidism of renal origin: Secondary | ICD-10-CM | POA: Diagnosis not present

## 2017-07-01 DIAGNOSIS — N186 End stage renal disease: Secondary | ICD-10-CM | POA: Diagnosis not present

## 2017-07-01 LAB — POCT INR: INR: 3.1 — AB (ref 0.9–1.1)

## 2017-07-02 ENCOUNTER — Other Ambulatory Visit: Payer: Self-pay | Admitting: Endocrinology

## 2017-07-02 ENCOUNTER — Other Ambulatory Visit: Payer: Self-pay | Admitting: Internal Medicine

## 2017-07-03 DIAGNOSIS — N2581 Secondary hyperparathyroidism of renal origin: Secondary | ICD-10-CM | POA: Diagnosis not present

## 2017-07-03 DIAGNOSIS — N186 End stage renal disease: Secondary | ICD-10-CM | POA: Diagnosis not present

## 2017-07-03 DIAGNOSIS — E1122 Type 2 diabetes mellitus with diabetic chronic kidney disease: Secondary | ICD-10-CM | POA: Diagnosis not present

## 2017-07-03 DIAGNOSIS — E1129 Type 2 diabetes mellitus with other diabetic kidney complication: Secondary | ICD-10-CM | POA: Diagnosis not present

## 2017-07-05 ENCOUNTER — Other Ambulatory Visit: Payer: Self-pay | Admitting: General Practice

## 2017-07-05 MED ORDER — WARFARIN SODIUM 5 MG PO TABS: ORAL_TABLET | ORAL | 0 refills | Status: DC

## 2017-07-06 DIAGNOSIS — E1122 Type 2 diabetes mellitus with diabetic chronic kidney disease: Secondary | ICD-10-CM | POA: Diagnosis not present

## 2017-07-06 DIAGNOSIS — E1129 Type 2 diabetes mellitus with other diabetic kidney complication: Secondary | ICD-10-CM | POA: Diagnosis not present

## 2017-07-06 DIAGNOSIS — N186 End stage renal disease: Secondary | ICD-10-CM | POA: Diagnosis not present

## 2017-07-06 DIAGNOSIS — N2581 Secondary hyperparathyroidism of renal origin: Secondary | ICD-10-CM | POA: Diagnosis not present

## 2017-07-07 ENCOUNTER — Encounter: Payer: Self-pay | Admitting: Internal Medicine

## 2017-07-08 DIAGNOSIS — I82492 Acute embolism and thrombosis of other specified deep vein of left lower extremity: Secondary | ICD-10-CM | POA: Diagnosis not present

## 2017-07-08 DIAGNOSIS — E1129 Type 2 diabetes mellitus with other diabetic kidney complication: Secondary | ICD-10-CM | POA: Diagnosis not present

## 2017-07-08 DIAGNOSIS — E1122 Type 2 diabetes mellitus with diabetic chronic kidney disease: Secondary | ICD-10-CM | POA: Diagnosis not present

## 2017-07-08 DIAGNOSIS — N2581 Secondary hyperparathyroidism of renal origin: Secondary | ICD-10-CM | POA: Diagnosis not present

## 2017-07-08 DIAGNOSIS — M109 Gout, unspecified: Secondary | ICD-10-CM | POA: Diagnosis not present

## 2017-07-08 DIAGNOSIS — N186 End stage renal disease: Secondary | ICD-10-CM | POA: Diagnosis not present

## 2017-07-10 DIAGNOSIS — E1129 Type 2 diabetes mellitus with other diabetic kidney complication: Secondary | ICD-10-CM | POA: Diagnosis not present

## 2017-07-10 DIAGNOSIS — N186 End stage renal disease: Secondary | ICD-10-CM | POA: Diagnosis not present

## 2017-07-10 DIAGNOSIS — N2581 Secondary hyperparathyroidism of renal origin: Secondary | ICD-10-CM | POA: Diagnosis not present

## 2017-07-10 DIAGNOSIS — E1122 Type 2 diabetes mellitus with diabetic chronic kidney disease: Secondary | ICD-10-CM | POA: Diagnosis not present

## 2017-07-11 DIAGNOSIS — N186 End stage renal disease: Secondary | ICD-10-CM | POA: Diagnosis not present

## 2017-07-11 DIAGNOSIS — Z992 Dependence on renal dialysis: Secondary | ICD-10-CM | POA: Diagnosis not present

## 2017-07-11 DIAGNOSIS — E1122 Type 2 diabetes mellitus with diabetic chronic kidney disease: Secondary | ICD-10-CM | POA: Diagnosis not present

## 2017-07-13 DIAGNOSIS — Z23 Encounter for immunization: Secondary | ICD-10-CM | POA: Diagnosis not present

## 2017-07-13 DIAGNOSIS — E1122 Type 2 diabetes mellitus with diabetic chronic kidney disease: Secondary | ICD-10-CM | POA: Diagnosis not present

## 2017-07-13 DIAGNOSIS — D631 Anemia in chronic kidney disease: Secondary | ICD-10-CM | POA: Diagnosis not present

## 2017-07-13 DIAGNOSIS — N2581 Secondary hyperparathyroidism of renal origin: Secondary | ICD-10-CM | POA: Diagnosis not present

## 2017-07-13 DIAGNOSIS — N186 End stage renal disease: Secondary | ICD-10-CM | POA: Diagnosis not present

## 2017-07-14 ENCOUNTER — Ambulatory Visit: Payer: Self-pay | Admitting: Internal Medicine

## 2017-07-15 DIAGNOSIS — Z23 Encounter for immunization: Secondary | ICD-10-CM | POA: Diagnosis not present

## 2017-07-15 DIAGNOSIS — D631 Anemia in chronic kidney disease: Secondary | ICD-10-CM | POA: Diagnosis not present

## 2017-07-15 DIAGNOSIS — I82492 Acute embolism and thrombosis of other specified deep vein of left lower extremity: Secondary | ICD-10-CM | POA: Diagnosis not present

## 2017-07-15 DIAGNOSIS — N186 End stage renal disease: Secondary | ICD-10-CM | POA: Diagnosis not present

## 2017-07-15 DIAGNOSIS — E1122 Type 2 diabetes mellitus with diabetic chronic kidney disease: Secondary | ICD-10-CM | POA: Diagnosis not present

## 2017-07-15 DIAGNOSIS — N2581 Secondary hyperparathyroidism of renal origin: Secondary | ICD-10-CM | POA: Diagnosis not present

## 2017-07-17 DIAGNOSIS — N186 End stage renal disease: Secondary | ICD-10-CM | POA: Diagnosis not present

## 2017-07-17 DIAGNOSIS — N2581 Secondary hyperparathyroidism of renal origin: Secondary | ICD-10-CM | POA: Diagnosis not present

## 2017-07-17 DIAGNOSIS — E1122 Type 2 diabetes mellitus with diabetic chronic kidney disease: Secondary | ICD-10-CM | POA: Diagnosis not present

## 2017-07-17 DIAGNOSIS — D631 Anemia in chronic kidney disease: Secondary | ICD-10-CM | POA: Diagnosis not present

## 2017-07-17 DIAGNOSIS — Z23 Encounter for immunization: Secondary | ICD-10-CM | POA: Diagnosis not present

## 2017-07-19 ENCOUNTER — Ambulatory Visit (INDEPENDENT_AMBULATORY_CARE_PROVIDER_SITE_OTHER): Payer: Medicare Other | Admitting: Internal Medicine

## 2017-07-19 ENCOUNTER — Encounter: Payer: Self-pay | Admitting: Internal Medicine

## 2017-07-19 VITALS — BP 140/60 | HR 93 | Temp 98.7°F | Ht 70.0 in | Wt 293.0 lb

## 2017-07-19 DIAGNOSIS — Z794 Long term (current) use of insulin: Secondary | ICD-10-CM

## 2017-07-19 DIAGNOSIS — E1121 Type 2 diabetes mellitus with diabetic nephropathy: Secondary | ICD-10-CM

## 2017-07-19 LAB — GLUCOSE, POCT (MANUAL RESULT ENTRY): POC Glucose: 111 mg/dl — AB (ref 70–99)

## 2017-07-19 NOTE — Patient Instructions (Signed)
We will call when the papers are ready

## 2017-07-19 NOTE — Progress Notes (Signed)
   Subjective:    Patient ID: [name] [name], male    DOB: [birth year], 51 y.o.   MRN: [id number]  HPI The patient is a 51 YO man coming in for follow up of his morbid obesity. He is interested in getting weight loss surgery as he is not currently considered a candidate for kidney transplant due to weight. He thinks he has tried to diet several times. Cannot recall any specific diet interventions but has tried cutting back , increased exercise, last time April 2017 he thinks. He has never been successful with losing weight. He has been large for some time. States initially when he got his new kidney he lost about 100 pounds. Some of that weight has come back since that time.   Review of Systems  Constitutional: Positive for activity change and fatigue. Negative for appetite change, fever and unexpected weight change.  HENT: Negative.   Respiratory: Negative for cough, chest tightness and shortness of breath.   Cardiovascular: Negative for chest pain, palpitations and leg swelling.  Gastrointestinal: Negative for abdominal distention, abdominal pain, constipation, diarrhea, nausea and vomiting.  Musculoskeletal: Negative.   Skin: Negative.   Neurological: Negative.   Psychiatric/Behavioral: Negative.       Objective:   Physical Exam  Constitutional: He is oriented to person, place, and time. He appears well-developed and well-nourished.  Obese  HENT:  Head: Normocephalic and atraumatic.  Eyes: EOM are normal.  Neck: Normal range of motion.  Cardiovascular: Normal rate and regular rhythm.   Pulmonary/Chest: Effort normal and breath sounds normal. No respiratory distress. He has no wheezes. He has no rales.  Abdominal: Soft. Bowel sounds are normal. He exhibits no distension. There is no tenderness. There is no rebound.  Musculoskeletal: He exhibits no edema.  Neurological: He is alert and oriented to person, place, and time. Coordination abnormal.  Electric scooter for ambulation, can  transfer  Skin: Skin is warm and dry.  Psychiatric: He has a normal mood and affect.   Vitals:   07/19/17 1535  BP: 140/60  Pulse: 93  Temp: 98.7 F (37.1 C)  TempSrc: Oral  SpO2: 98%  Weight: 293 lb (132.9 kg)  Height: 5\' 10"  (1.778 m)      Assessment & Plan:

## 2017-07-20 ENCOUNTER — Telehealth: Payer: Self-pay

## 2017-07-20 DIAGNOSIS — Z23 Encounter for immunization: Secondary | ICD-10-CM | POA: Diagnosis not present

## 2017-07-20 DIAGNOSIS — E1122 Type 2 diabetes mellitus with diabetic chronic kidney disease: Secondary | ICD-10-CM | POA: Diagnosis not present

## 2017-07-20 DIAGNOSIS — N2581 Secondary hyperparathyroidism of renal origin: Secondary | ICD-10-CM | POA: Diagnosis not present

## 2017-07-20 DIAGNOSIS — N186 End stage renal disease: Secondary | ICD-10-CM | POA: Diagnosis not present

## 2017-07-20 DIAGNOSIS — D631 Anemia in chronic kidney disease: Secondary | ICD-10-CM | POA: Diagnosis not present

## 2017-07-20 NOTE — Telephone Encounter (Signed)
Called patient but went directly to voicemail. Patients paperwork is ready for pick up and will be up front when he calls back

## 2017-07-20 NOTE — Assessment & Plan Note (Signed)
Letter of recommendation and form filled out to try to get weight loss surgery. Informed him that he may need to make a fresh attempt to lose weight prior to being a surgical candidate since the last time he tried was 2017.

## 2017-07-21 ENCOUNTER — Other Ambulatory Visit: Payer: Self-pay

## 2017-07-22 ENCOUNTER — Ambulatory Visit (INDEPENDENT_AMBULATORY_CARE_PROVIDER_SITE_OTHER): Payer: Medicare Other | Admitting: General Practice

## 2017-07-22 ENCOUNTER — Other Ambulatory Visit: Payer: Self-pay

## 2017-07-22 DIAGNOSIS — Z7901 Long term (current) use of anticoagulants: Secondary | ICD-10-CM | POA: Insufficient documentation

## 2017-07-22 DIAGNOSIS — Z23 Encounter for immunization: Secondary | ICD-10-CM | POA: Diagnosis not present

## 2017-07-22 DIAGNOSIS — N2581 Secondary hyperparathyroidism of renal origin: Secondary | ICD-10-CM | POA: Diagnosis not present

## 2017-07-22 DIAGNOSIS — N186 End stage renal disease: Secondary | ICD-10-CM | POA: Diagnosis not present

## 2017-07-22 DIAGNOSIS — D631 Anemia in chronic kidney disease: Secondary | ICD-10-CM | POA: Diagnosis not present

## 2017-07-22 DIAGNOSIS — I82492 Acute embolism and thrombosis of other specified deep vein of left lower extremity: Secondary | ICD-10-CM | POA: Diagnosis not present

## 2017-07-22 DIAGNOSIS — E1122 Type 2 diabetes mellitus with diabetic chronic kidney disease: Secondary | ICD-10-CM | POA: Diagnosis not present

## 2017-07-22 LAB — POCT INR: INR: 4.1

## 2017-07-22 NOTE — Patient Instructions (Signed)
Pre visit review using our clinic review tool, if applicable. No additional management support is needed unless otherwise documented below in the visit note. 

## 2017-07-22 NOTE — Progress Notes (Signed)
I have reviewed and agree with the plan. 

## 2017-07-23 ENCOUNTER — Other Ambulatory Visit (INDEPENDENT_AMBULATORY_CARE_PROVIDER_SITE_OTHER): Payer: Medicare Other

## 2017-07-23 DIAGNOSIS — Z794 Long term (current) use of insulin: Secondary | ICD-10-CM | POA: Diagnosis not present

## 2017-07-23 DIAGNOSIS — E1165 Type 2 diabetes mellitus with hyperglycemia: Secondary | ICD-10-CM | POA: Diagnosis not present

## 2017-07-23 LAB — LIPID PANEL
Cholesterol: 93 mg/dL (ref 0–200)
HDL: 26.1 mg/dL — ABNORMAL LOW (ref >39.00)
LDL Cholesterol: 36 mg/dL (ref 0–99)
NonHDL: 66.93
Total CHOL/HDL Ratio: 4
Triglycerides: 157 mg/dL — ABNORMAL HIGH (ref 0.0–149.0)
VLDL: 31.4 mg/dL (ref 0.0–40.0)

## 2017-07-23 LAB — GLUCOSE, RANDOM: Glucose, Bld: 146 mg/dL — ABNORMAL HIGH (ref 70–99)

## 2017-07-24 DIAGNOSIS — D631 Anemia in chronic kidney disease: Secondary | ICD-10-CM | POA: Diagnosis not present

## 2017-07-24 DIAGNOSIS — Z23 Encounter for immunization: Secondary | ICD-10-CM | POA: Diagnosis not present

## 2017-07-24 DIAGNOSIS — E1122 Type 2 diabetes mellitus with diabetic chronic kidney disease: Secondary | ICD-10-CM | POA: Diagnosis not present

## 2017-07-24 DIAGNOSIS — N186 End stage renal disease: Secondary | ICD-10-CM | POA: Diagnosis not present

## 2017-07-24 DIAGNOSIS — N2581 Secondary hyperparathyroidism of renal origin: Secondary | ICD-10-CM | POA: Diagnosis not present

## 2017-07-25 LAB — FRUCTOSAMINE: Fructosamine: 408 umol/L — ABNORMAL HIGH (ref 0–285)

## 2017-07-26 ENCOUNTER — Ambulatory Visit (INDEPENDENT_AMBULATORY_CARE_PROVIDER_SITE_OTHER): Payer: Medicare Other | Admitting: Endocrinology

## 2017-07-26 ENCOUNTER — Other Ambulatory Visit: Payer: Self-pay

## 2017-07-26 ENCOUNTER — Encounter: Payer: Self-pay | Admitting: Endocrinology

## 2017-07-26 VITALS — BP 146/74 | HR 88 | Ht 70.0 in | Wt 295.0 lb

## 2017-07-26 DIAGNOSIS — E1165 Type 2 diabetes mellitus with hyperglycemia: Secondary | ICD-10-CM | POA: Diagnosis not present

## 2017-07-26 DIAGNOSIS — I10 Essential (primary) hypertension: Secondary | ICD-10-CM

## 2017-07-26 DIAGNOSIS — Z794 Long term (current) use of insulin: Secondary | ICD-10-CM

## 2017-07-26 MED ORDER — INSULIN REGULAR HUMAN (CONC) 500 UNIT/ML ~~LOC~~ SOPN: PEN_INJECTOR | SUBCUTANEOUS | 0 refills | Status: DC

## 2017-07-26 MED ORDER — GLUCOSE BLOOD VI STRP: ORAL_STRIP | 0 refills | Status: DC

## 2017-07-26 MED ORDER — INSULIN GLARGINE 300 UNIT/ML ~~LOC~~ SOPN: 90.0000 [IU] | PEN_INJECTOR | Freq: Two times a day (BID) | SUBCUTANEOUS | 2 refills | Status: DC

## 2017-07-26 NOTE — Patient Instructions (Addendum)
Check sugar 4x daily  Toujeo 80 twice daily  Check blood sugars on waking up  daily  Also check blood sugars about 2 hours after a meal and do this after different meals by rotation  Recommended blood sugar levels on waking up is 90-130 and about 2 hours after meal is 130-160  Please bring your blood sugar monitor to each visit, thank you

## 2017-07-26 NOTE — Progress Notes (Signed)
Patient ID: [name] [name], male   DOB: [birth year], 51 y.o.   MRN: [id number]           Reason for Appointment: Follow-up for Type 2 Diabetes  History of Present Illness:          Date of diagnosis of type 2 diabetes mellitus : 1990       Background history:  He has been taking insulin since about 1999, previously was on unknown oral agents He had good control with insulin initially but A1c was much higher in 2008 and subsequently has had required larger doses of insulin He has been on Lantus for the last few years along with Novolog,followed by an endocrinologist since 2008 His A1c was 6.3 in 04/2014   Prior to his consultation he was on a regimen of Novolog , 50 units 5 times a day with each meal and snack along with 90 Units 3 times a day of Lantus   Recent history:   INSULIN regimen is :U-500 insulin with KwikPen 30 before bfst, 35 before lunch and 30 before dinner, usually 30 min before  Sliding scale use for high sugars: Sugar 150-199 take 2 units, 200-250= 4 units and over 250 take 6 units   TOUJEO 90 units twice daily  A1c is usually about the same at 7.2 fructosamine is again much higher at 408  Current blood sugar patterns and problems identified:  He said that he does not have enough test strips to be checking blood sugars as directed and is only checking them somewhat sporadically and mostly in the mornings either when he waking up or dialysis or later on at 8-9 AM on the other days  More recently is getting some hypoglycemia overnight with blood sugars as low as 55 at 2 AM  He was told to increase his morning Toujeo and reduce evening dose but he is taking the same dose twice a day again  He thinks he is adjusting the Humulin R based on what he is eating but his blood sugars are not being checked later in the day  He is concerned about his weight gain but he has not followed any specific meal plan and has not seen a dietitian for some time  Blood sugars around 8-9 AM  per occasionally over 200 and not clear why they are fluctuating  He cannot explain why his blood sugar was 338 last evening at 6 PM   Compliance with the medical regimen: Inconsistent Hypoglycemia: As above  Glucose monitoring:  done 0-2 times a day         Glucometer:    Aviva      Blood Glucose readings by:  Mean values apply above for all meters except median for One Touch  PRE-MEAL Fasting Lunch Dinner Overnight  Overall  Glucose range: 58-292   151, 338  55-187    Mean/median:     137     Self-care: The diet that the patient has been following is: tries to limit high-fat meals, usually eating  2-3 meals a day.     Typical meal intake: Breakfast is grits with pecans, olive oil. Dinner 5-6 pm               Dietician visit, most recent:2013               Exercise:  unable to do any, In wheelchair  Weight history:  Wt Readings from Last 3 Encounters:  07/26/17 295 lb (133.8 kg)  07/19/17 293  lb (132.9 kg)  05/26/17 284 lb 6.4 oz (129 kg)    Glycemic control:   Lab Results  Component Value Date   HGBA1C 7.2 (H) 05/19/2017   HGBA1C 7.3 (H) 01/22/2017   HGBA1C 7.1 (H) 08/28/2016   Lab Results  Component Value Date   MICROALBUR 232.9 (H) 06/19/2015   LDLCALC 36 07/23/2017   CREATININE 6.12 (H) 12/15/2016    Lab Results  Component Value Date   FRUCTOSAMINE 408 (H) 07/23/2017   FRUCTOSAMINE 408 (H) 05/19/2017   FRUCTOSAMINE 361 (H) 10/26/2016      Allergies as of 07/26/2017      Reactions   Pork-derived Products Anaphylaxis, Other (See Comments)   Fever also    Ace Inhibitors Cough   Hydrocodone Other (See Comments)   Hallucinations      Medication List       Accurate as of 07/26/17 10:40 AM. Always use your most recent med list.          accu-chek multiclix lancets Check blood sugar four times daily   calcium acetate 667 MG capsule Commonly known as:  PHOSLO Take 3 capsules (2,001 mg total) by mouth 3 (three) times daily with meals.     clonazePAM 1 MG tablet Commonly known as:  KLONOPIN   cloNIDine 0.3 MG tablet Commonly known as:  CATAPRES TAKE 1 TABLET THREE TIMES DAILY   Febuxostat 80 MG Tabs Commonly known as:  ULORIC Take 1 tablet (80 mg total) by mouth daily.   glucose blood test strip Commonly known as:  ACCU-CHEK GUIDE Check blood sugar four times daily   ACCU-CHEK SMARTVIEW test strip Generic drug:  glucose blood USE FOUR TIMES DAILY AS DIRECTED TO CHECK BLOOD SUGAR   HUMULIN R U-500 KWIKPEN 500 UNIT/ML kwikpen Generic drug:  insulin regular human CONCENTRATED INJECT SUBCUTANEOUSLY 15 UNITS AT BREAKFAST, 25 UNITS AT LUNCH AND 20 UNITS BEFORE SUPPER   hydrocortisone cream 1 % Apply to affected area 2 times daily   Insulin Pen Needle 32G X 8 MM Misc Use as directed   BD PEN NEEDLE NANO U/F 32G X 4 MM Misc Generic drug:  Insulin Pen Needle USE  5  PER  DAY AS DIRECTED   labetalol 100 MG tablet Commonly known as:  NORMODYNE TAKE 1 TABLET THREE TIMES DAILY   metoprolol succinate 25 MG 24 hr tablet Commonly known as:  TOPROL-XL Take 1 tablet (25 mg total) by mouth daily.   midodrine 10 MG tablet Commonly known as:  PROAMATINE Take 10 mg by mouth as needed (low blood pressure).   ofloxacin 0.3 % OTIC solution Commonly known as:  FLOXIN Place 10 drops into the right ear daily. For a total of 7 days   omeprazole 20 MG capsule Commonly known as:  PRILOSEC TAKE 1 CAPSULE EVERY DAY   RENVELA 800 MG tablet Generic drug:  sevelamer carbonate Take 2,400 mg by mouth 3 (three) times daily with meals.   rosuvastatin 20 MG tablet Commonly known as:  CRESTOR TAKE 1 TABLET AT BEDTIME   TOUJEO SOLOSTAR 300 UNIT/ML Sopn Generic drug:  Insulin Glargine INJECT  90 UNITS SUBCUTANEOUSLY TWICE DAILY   traMADol 50 MG tablet Commonly known as:  ULTRAM Take 1 tablet (50 mg total) by mouth every 8 (eight) hours as needed.   VELTASSA 8.4 g packet Generic drug:  patiromer Take 8.4 g by mouth daily.    warfarin 5 MG tablet Commonly known as:  COUMADIN Take 1 (5 mg) tablet every day except  for Wednesday & Saturday when you take 7.54m (1.5 tablets) OR As directed.       Allergies:  Allergies  Allergen Reactions  . Pork-Derived Products Anaphylaxis and Other (See Comments)    Fever also   . Ace Inhibitors Cough  . Hydrocodone Other (See Comments)    Hallucinations    Past Medical History:  Diagnosis Date  . Allergic rhinitis   . Anemia   . Aortic insufficiency    a. Echo 7/16:  Mod LVH, EF 50-55%, mild to mod AI, severe LAE, PASP 57 mmHg (LV ID end diastolic 598.9mm);  b. Echo 2/17:  Mod LVH, EF 50-55%, mod AI, MAC, mild MR, mod LAE, mild to mod TR, PASP 63 mmHg  . Bilateral carpal tunnel syndrome   . CHF (congestive heart failure) ([place]   . Chronic kidney disease    Kidney Transplant 2009 09/26/15- transplanated kidney failing  . Chronic kidney disease (CKD), stage IV (severe) ([place]   . Chronic renal allograft nephropathy   . Claustrophobia   . Complication of anesthesia    [facility]."  . COPD (chronic obstructive pulmonary disease) ([place]   . Diabetes mellitus    Type II  . Diabetic peripheral neuropathy ([place]   . Diabetic retinopathy ([place]   . Diminished eyesight   . Discitis of lumbar region    resolved  . DVT (deep venous thrombosis) ([place] 10/07/2015   LLE  . Dyslipidemia   . Endocarditis   . Endocarditis 11/28/14  . Gait disorder   . Gastroparesis   . GERD (gastroesophageal reflux disease)   . Gout   . Hearing difficulty   . Hyperlipidemia   . Hypertension   . Incomplete bladder emptying   . Leg pain 02/05/11   with walking  . Morbid obesity ([place]   . Nervousness(799.21)   . Obstructive sleep apnea    not using CPAP- lost 100lbs  . Osteomyelitis ([place]   . Posttraumatic stress disorder   . Renal transplant, status post 04/05/2014  . Rotator cuff disorder   . Secondary hyperparathyroidism ([place]    . Sepsis ([place]    Postive blood cultures -   . Shortness of breath    when lying flat  . Urethral stricture   . Vitamin D deficiency   . Weight gain     Past Surgical History:  Procedure Laterality Date  . ARTERIOVENOUS GRAFT PLACEMENT Bilateral "several"  . AV FISTULA PLACEMENT Bilateral 2015  . BASCILIC VEIN TRANSPOSITION Right 09/27/2015  . [place] 09[place]   Procedure: BASILIC VEIN TRANSPOSITION right;  Surgeon: JMal Misty MD;  [facility]  Service: Vascular;  Laterality: Right;  . CATARACT EXTRACTION W/ INTRAOCULAR LENS  IMPLANT, BILATERAL Bilateral   . CYSTOSCOPY W/ INTERNAL URETHROTOMY  09/2014  . KIDNEY TRANSPLANT  2009    Family History  Problem Relation Age of Onset  . Hypertension Mother   . Diabetes Father     Social History:  reports that he has never smoked. He has never used smokeless tobacco. He reports that he does not drink alcohol or use drugs.    Review of Systems    ROS  OBESITY: He says he wants to get the bariatric surgery done  Lipid history: He is on 20 mg Crestor With the following results    Lab Results  Component Value Date   CHOL 93 07/23/2017   HDL 26.10 (L) 07/23/2017  LDLCALC 36 07/23/2017   TRIG 157.0 (H) 07/23/2017   CHOLHDL 4 07/23/2017            Hypertension: His blood pressure is Followed by nephrologist and PCP  Again on 0.3 mg dose of clonidine along with labetalol  Tachycardia is controlled with metoprolol that was started on his last visit  RENAL: He has had a transplant in 2009 and  he is on dialysis  Neurological: Has has had  decreased sensation in his feet, no burning Previously followed by neurologist  He has had marked decrease in his balance and also some weakness in his legs, using a power scooter  Last foot exam was in 02/2017: Absent monofilament sensation in his feet and toes and extending up to the middle of the lower leg on the left and up to the ankle on the  right  LABS:  Lab on 07/23/2017  Component Date Value Ref Range Status  . Fructosamine 07/23/2017 408* 0 - 285 umol/L Final   Comment: Published reference interval for apparently healthy subjects between age 24 and 85 is 56 - 285 umol/L and in a poorly controlled diabetic population is 228 - 563 umol/L with a mean of 396 umol/L.   Marland Kitchen Cholesterol 07/23/2017 93  0 - 200 mg/dL Final   ATP III Classification       Desirable:  < 200 mg/dL               Borderline High:  200 - 239 mg/dL          High:  > = 240 mg/dL  . Triglycerides 07/23/2017 157.0* 0.0 - 149.0 mg/dL Final   Normal:  <150 mg/dLBorderline High:  150 - 199 mg/dL  . HDL 07/23/2017 26.10* >39.00 mg/dL Final  . VLDL 07/23/2017 31.4  0.0 - 40.0 mg/dL Final  . LDL Cholesterol 07/23/2017 36  0 - 99 mg/dL Final  . Total CHOL/HDL Ratio 07/23/2017 4   Final                  Men          Women1/2 Average Risk     3.4          3.3Average Risk          5.0          4.42X Average Risk          9.6          7.13X Average Risk          15.0          11.0                      . NonHDL 07/23/2017 66.93   Final   NOTE:  Non-HDL goal should be 30 mg/dL higher than patient's LDL goal (i.e. LDL goal of < 70 mg/dL, would have non-HDL goal of < 100 mg/dL)  . Glucose, Bld 07/23/2017 146* 70 - 99 mg/dL Final  Anti-coag visit on 07/22/2017  Component Date Value Ref Range Status  . INR 07/22/2017 4.1   Final   reported by [facility] labs on 10/5    Physical Examination:  BP (!) 146/74   Pulse 88   Ht _0  (1.778 m)   Wt 295 lb (133.8 kg)   SpO2 92%   BMI 42.33 kg/m      ASSESSMENT:  Diabetes type 2, long-standing with obesity See history of present illness for detailed discussion  of his current management, blood sugar patterns and problems identified.  His blood sugars are difficult to assess because of lack of adequate glucose monitoring especially postprandially Blood sugars are not being assessed after meals Diabetes is still not  well controlled as judged by his fructosamine of 408 Also his diet needs to be assessed and he may do better with  consistent diet and planning and balanced meals  Discussed that if he needs to consider bariatric surgery needs to have some initial dietary assessment and try to follow a good plan   LIPIDS: Relatively low with his regimen of Crestor, may consider reducing the dose  TACHYCARDIA: Better controlled with metoprolol and he will continue  HYPERTENSION: Followed by nephrologist, blood pressure is high normal today  PLAN:   He will Need to start checking blood sugars much more frequently   Again discussed that if he is able to check 4 times a day he will be able to get the freestyle Libre  Cannot discuss doses of insulin today since he does not have enough monitoring  However because of tendency to recent overnight hypoglycemia and reduce his Toujeo by 10 units twice a day  Also discussed in detail the use of the Omnipod insulin pump which would be simpler to use than the Medtronic especially since he cannot the guardian sensor  He will call with information in call to check on the coverage of his insurance  Meanwhile he will call if he is having continued hyperglycemia  Referral made for dietitian to see  May need to consider increasing the Humulin R especially at breakfast and lunch if sugars are higher later in the day  Otherwise may also benefit from switching to U-100 rapid acting insulin  There are no Patient Instructions on file for this visit.   Counseling time on subjects discussed in assessment and plan sections is over 50% of today's 25 minute visit   [name] [name] 07/26/2017, 10:40 AM   Note: This office note was prepared with Estate agent. Any transcriptional errors that result from this process are unintentional.

## 2017-07-27 DIAGNOSIS — E1122 Type 2 diabetes mellitus with diabetic chronic kidney disease: Secondary | ICD-10-CM | POA: Diagnosis not present

## 2017-07-27 DIAGNOSIS — N2581 Secondary hyperparathyroidism of renal origin: Secondary | ICD-10-CM | POA: Diagnosis not present

## 2017-07-27 DIAGNOSIS — Z23 Encounter for immunization: Secondary | ICD-10-CM | POA: Diagnosis not present

## 2017-07-27 DIAGNOSIS — N186 End stage renal disease: Secondary | ICD-10-CM | POA: Diagnosis not present

## 2017-07-27 DIAGNOSIS — D631 Anemia in chronic kidney disease: Secondary | ICD-10-CM | POA: Diagnosis not present

## 2017-07-29 ENCOUNTER — Ambulatory Visit (INDEPENDENT_AMBULATORY_CARE_PROVIDER_SITE_OTHER): Payer: Medicare Other | Admitting: General Practice

## 2017-07-29 DIAGNOSIS — N186 End stage renal disease: Secondary | ICD-10-CM | POA: Diagnosis not present

## 2017-07-29 DIAGNOSIS — I82502 Chronic embolism and thrombosis of unspecified deep veins of left lower extremity: Secondary | ICD-10-CM | POA: Diagnosis not present

## 2017-07-29 DIAGNOSIS — E1122 Type 2 diabetes mellitus with diabetic chronic kidney disease: Secondary | ICD-10-CM | POA: Diagnosis not present

## 2017-07-29 DIAGNOSIS — Z7901 Long term (current) use of anticoagulants: Secondary | ICD-10-CM | POA: Diagnosis not present

## 2017-07-29 DIAGNOSIS — I82492 Acute embolism and thrombosis of other specified deep vein of left lower extremity: Secondary | ICD-10-CM | POA: Diagnosis not present

## 2017-07-29 DIAGNOSIS — N2581 Secondary hyperparathyroidism of renal origin: Secondary | ICD-10-CM | POA: Diagnosis not present

## 2017-07-29 DIAGNOSIS — Z23 Encounter for immunization: Secondary | ICD-10-CM | POA: Diagnosis not present

## 2017-07-29 DIAGNOSIS — D631 Anemia in chronic kidney disease: Secondary | ICD-10-CM | POA: Diagnosis not present

## 2017-07-29 LAB — POCT INR: INR: 1.93

## 2017-07-29 NOTE — Patient Instructions (Signed)
Pre visit review using our clinic review tool, if applicable. No additional management support is needed unless otherwise documented below in the visit note. 

## 2017-07-31 DIAGNOSIS — N186 End stage renal disease: Secondary | ICD-10-CM | POA: Diagnosis not present

## 2017-07-31 DIAGNOSIS — N2581 Secondary hyperparathyroidism of renal origin: Secondary | ICD-10-CM | POA: Diagnosis not present

## 2017-07-31 DIAGNOSIS — E1122 Type 2 diabetes mellitus with diabetic chronic kidney disease: Secondary | ICD-10-CM | POA: Diagnosis not present

## 2017-07-31 DIAGNOSIS — Z23 Encounter for immunization: Secondary | ICD-10-CM | POA: Diagnosis not present

## 2017-07-31 DIAGNOSIS — D631 Anemia in chronic kidney disease: Secondary | ICD-10-CM | POA: Diagnosis not present

## 2017-08-03 ENCOUNTER — Encounter (HOSPITAL_BASED_OUTPATIENT_CLINIC_OR_DEPARTMENT_OTHER): Payer: Self-pay | Admitting: *Deleted

## 2017-08-03 ENCOUNTER — Telehealth (HOSPITAL_BASED_OUTPATIENT_CLINIC_OR_DEPARTMENT_OTHER): Payer: Self-pay | Admitting: Emergency Medicine

## 2017-08-03 ENCOUNTER — Emergency Department (HOSPITAL_BASED_OUTPATIENT_CLINIC_OR_DEPARTMENT_OTHER)
Admission: EM | Admit: 2017-08-03 | Discharge: 2017-08-03 | Disposition: A | Discharge status: Home / Self Care | Payer: Medicare Other | Attending: Emergency Medicine | Admitting: Emergency Medicine

## 2017-08-03 DIAGNOSIS — Y92012 Bathroom of single-family (private) house as the place of occurrence of the external cause: Secondary | ICD-10-CM | POA: Insufficient documentation

## 2017-08-03 DIAGNOSIS — T25022A Burn of unspecified degree of left foot, initial encounter: Secondary | ICD-10-CM | POA: Diagnosis not present

## 2017-08-03 DIAGNOSIS — E1140 Type 2 diabetes mellitus with diabetic neuropathy, unspecified: Secondary | ICD-10-CM | POA: Insufficient documentation

## 2017-08-03 DIAGNOSIS — D631 Anemia in chronic kidney disease: Secondary | ICD-10-CM | POA: Diagnosis not present

## 2017-08-03 DIAGNOSIS — E1122 Type 2 diabetes mellitus with diabetic chronic kidney disease: Secondary | ICD-10-CM | POA: Diagnosis not present

## 2017-08-03 DIAGNOSIS — Z992 Dependence on renal dialysis: Secondary | ICD-10-CM | POA: Insufficient documentation

## 2017-08-03 DIAGNOSIS — T310 Burns involving less than 10% of body surface: Secondary | ICD-10-CM | POA: Diagnosis not present

## 2017-08-03 DIAGNOSIS — X110XXA Contact with hot water in bath or tub, initial encounter: Secondary | ICD-10-CM | POA: Diagnosis not present

## 2017-08-03 DIAGNOSIS — T25021A Burn of unspecified degree of right foot, initial encounter: Secondary | ICD-10-CM | POA: Diagnosis present

## 2017-08-03 DIAGNOSIS — Y998 Other external cause status: Secondary | ICD-10-CM | POA: Diagnosis not present

## 2017-08-03 DIAGNOSIS — T25222A Burn of second degree of left foot, initial encounter: Secondary | ICD-10-CM

## 2017-08-03 DIAGNOSIS — Y93E1 Activity, personal bathing and showering: Secondary | ICD-10-CM | POA: Diagnosis not present

## 2017-08-03 DIAGNOSIS — N2581 Secondary hyperparathyroidism of renal origin: Secondary | ICD-10-CM | POA: Diagnosis not present

## 2017-08-03 DIAGNOSIS — N185 Chronic kidney disease, stage 5: Secondary | ICD-10-CM | POA: Diagnosis not present

## 2017-08-03 DIAGNOSIS — Z79899 Other long term (current) drug therapy: Secondary | ICD-10-CM | POA: Diagnosis not present

## 2017-08-03 DIAGNOSIS — Z23 Encounter for immunization: Secondary | ICD-10-CM | POA: Insufficient documentation

## 2017-08-03 DIAGNOSIS — Z794 Long term (current) use of insulin: Secondary | ICD-10-CM | POA: Insufficient documentation

## 2017-08-03 DIAGNOSIS — N186 End stage renal disease: Secondary | ICD-10-CM | POA: Diagnosis not present

## 2017-08-03 DIAGNOSIS — T24201A Burn of second degree of unspecified site of right lower limb, except ankle and foot, initial encounter: Secondary | ICD-10-CM | POA: Diagnosis not present

## 2017-08-03 DIAGNOSIS — Z7901 Long term (current) use of anticoagulants: Secondary | ICD-10-CM | POA: Diagnosis not present

## 2017-08-03 DIAGNOSIS — T25221A Burn of second degree of right foot, initial encounter: Secondary | ICD-10-CM | POA: Diagnosis not present

## 2017-08-03 DIAGNOSIS — I509 Heart failure, unspecified: Secondary | ICD-10-CM | POA: Diagnosis not present

## 2017-08-03 DIAGNOSIS — I132 Hypertensive heart and chronic kidney disease with heart failure and with stage 5 chronic kidney disease, or end stage renal disease: Secondary | ICD-10-CM | POA: Diagnosis not present

## 2017-08-03 DIAGNOSIS — T24202A Burn of second degree of unspecified site of left lower limb, except ankle and foot, initial encounter: Secondary | ICD-10-CM | POA: Diagnosis not present

## 2017-08-03 MED ORDER — SILVER SULFADIAZINE 1 % EX CREA: 1.0000 "application " | TOPICAL_CREAM | Freq: Every day | CUTANEOUS | 0 refills | Status: DC

## 2017-08-03 MED ORDER — TETANUS-DIPHTH-ACELL PERTUSSIS 5-2.5-18.5 LF-MCG/0.5 IM SUSP: 0.5000 mL | Freq: Once | INTRAMUSCULAR | Status: DC

## 2017-08-03 MED ORDER — CEPHALEXIN 500 MG PO CAPS: 500.0000 mg | ORAL_CAPSULE | Freq: Four times a day (QID) | ORAL | 0 refills | Status: DC

## 2017-08-03 MED ORDER — SILVER SULFADIAZINE 1 % EX CREA
TOPICAL_CREAM | Freq: Once | CUTANEOUS | Status: AC
  Administered 2017-08-03: 16:00:00 via TOPICAL
  Filled 2017-08-03: qty 85

## 2017-08-03 NOTE — ED Notes (Signed)
Wound care complete with the family and bedside sitter. While wrapping the foot some of the kerlex hit the floor and this RN threw it out. The patients caregiver at the bedside took it out of the trash and started wrapping his foot stating " it is not on the burn" - MD and PA aware. Patient told to follow up with PMD and foot Dr. Marylene Buerger

## 2017-08-03 NOTE — Discharge Instructions (Addendum)
Apply burn cream to feet 1-2 times a day Take antibiotic 4 times daily for the next 5 days Apply clean dressing to feet at least once a day, more if dressings become dirty or soaked Return for worsening redness, drainage of pus, fever, or pain.  Follow up with podiatry

## 2017-08-03 NOTE — ED Provider Notes (Signed)
[facility]ERGENCY DEPARTMENT Provider Note   CSN: [id number] Arrival date & time: 08/03/17  1441     History   Chief Complaint Chief Complaint  Patient presents with  . Foot Burn    HPI [name] [name] is a 51 y.o. male who presents with bilateral foot burn. PMH significant for insulin dependent DM with diabetic neuropathy, CKD stage 4, COPD, hx of osteomyelitis. He states that last night he was going to soak his feet in hot water. He soaked his feet but didn't realize how hot it was due to his neuropathy. Soon after he developed burns and blisters on his feet bilaterally. He denies any pain. He has a home health nurse who has been wrapping the feet and the blisters have been oozing but have not burst.   HPI  Past Medical History:  Diagnosis Date  . Allergic rhinitis   . Anemia   . Aortic insufficiency    a. Echo 7/16:  Mod LVH, EF 50-55%, mild to mod AI, severe LAE, PASP 57 mmHg (LV ID end diastolic 78.5 mm);  b. Echo 2/17:  Mod LVH, EF 50-55%, mod AI, MAC, mild MR, mod LAE, mild to mod TR, PASP 63 mmHg  . Bilateral carpal tunnel syndrome   . CHF (congestive heart failure) ([place])   . Chronic kidney disease    Kidney Transplant 2009 09/26/15- transplanated kidney failing  . Chronic kidney disease (CKD), stage IV (severe) ([place])   . Chronic renal allograft nephropathy   . Claustrophobia   . Complication of anesthesia    "they said [facility] was affected by sleep aqpnea."  . COPD (chronic obstructive pulmonary disease) ([place])   . Diabetes mellitus    Type II  . Diabetic peripheral neuropathy ([place])   . Diabetic retinopathy ([place])   . Diminished eyesight   . Discitis of lumbar region    resolved  . DVT (deep venous thrombosis) ([place]) 10/07/2015   LLE  . Dyslipidemia   . Endocarditis   . Endocarditis 11/28/14  . Gait disorder   . Gastroparesis   . GERD (gastroesophageal reflux disease)   . Gout   . Hearing difficulty   . Hyperlipidemia   .  Hypertension   . Incomplete bladder emptying   . Leg pain 02/05/11   with walking  . Morbid obesity ([place])   . Nervousness(799.21)   . Obstructive sleep apnea    not using CPAP- lost 100lbs  . Osteomyelitis ([place])   . Posttraumatic stress disorder   . Renal transplant, status post 04/05/2014  . Rotator cuff disorder   . Secondary hyperparathyroidism ([place])   . Sepsis ([place])    Postive blood cultures -   . Shortness of breath    when lying flat  . Urethral stricture   . Vitamin D deficiency   . Weight gain     Patient Active Problem List   Diagnosis Date Noted  . Long term (current) use of anticoagulants 07/22/2017  . Right ear pain 05/19/2017  . ESRD on dialysis ([place]) 11/15/2015  . CHF (congestive heart failure) ([place]) 10/07/2015  . Prolonged Q-T interval on ECG 10/07/2015  . Chronic deep vein thrombosis (DVT) of left lower extremity ([place]) 10/07/2015  . Aortic insufficiency 04/12/2015  . Back pain at L4-L5 level   . Anemia due to chronic kidney disease   . Cervical radiculopathy   . Diabetic peripheral neuropathy ([place]) 10/16/2014  . Renal transplant, status post 04/05/2014  . OSA (obstructive sleep  apnea) 04/05/2014  . Severe obesity (BMI >= 40) ([place]) 04/05/2014  . Benign fibroma of prostate 02/05/2014  . Diabetic macular edema ([place]) 01/15/2012  . Diabetes mellitus, type 2 ([place]) 01/15/2012  . IMMUNOCOMPROMISED 01/30/2010  . HYPERPARATHYROIDISM, SECONDARY 01/27/2010  . GOUT 01/27/2010  . INSOMNIA 01/27/2010  . HLD (hyperlipidemia) 05/25/2008  . Essential hypertension 05/25/2008    Past Surgical History:  Procedure Laterality Date  . ARTERIOVENOUS GRAFT PLACEMENT Bilateral "several"  . AV FISTULA PLACEMENT Bilateral 2015  . BASCILIC VEIN TRANSPOSITION Right 09/27/2015  . [place] Right 09/27/2015   Procedure: BASILIC VEIN TRANSPOSITION right;  Surgeon: Mal Misty, MD;  Location: [facility];  Service: Vascular;  Laterality: Right;  . CATARACT EXTRACTION  W/ INTRAOCULAR LENS  IMPLANT, BILATERAL Bilateral   . CYSTOSCOPY W/ INTERNAL URETHROTOMY  09/2014  . KIDNEY TRANSPLANT  2009       Home Medications    Prior to Admission medications   Medication Sig Start Date End Date Taking? Authorizing Provider  BD PEN NEEDLE NANO U/F 32G X 4 MM MISC USE  5  PER  DAY AS DIRECTED 07/05/17   Elayne Snare, MD  calcium acetate (PHOSLO) 667 MG capsule Take 3 capsules (2,001 mg total) by mouth 3 (three) times daily with meals. 10/26/15   Ghimire, Henreitta Leber, MD  clonazePAM [place]) 1 MG tablet  12/24/16   [provider]  cloNIDine (CATAPRES) 0.3 MG tablet TAKE 1 TABLET THREE TIMES DAILY 07/14/16   Hoyt Koch, MD  Febuxostat (ULORIC) 80 MG TABS Take 1 tablet (80 mg total) by mouth daily. 04/07/16   Hoyt Koch, MD  glucose blood (ACCU-CHEK GUIDE) test strip Check blood sugar four times daily 09/02/16   Elayne Snare, MD  glucose blood (ACCU-CHEK SMARTVIEW) test strip USE FOUR TIMES DAILY AS DIRECTED TO CHECK BLOOD SUGAR 07/26/17   Elayne Snare, MD  hydrocortisone cream 1 % Apply to affected area 2 times daily 11/29/16   Blanchie Dessert, MD  Insulin Glargine (TOUJEO SOLOSTAR) 300 UNIT/ML SOPN Inject 90 Units into the skin 2 (two) times daily. 07/26/17   Elayne Snare, MD  Insulin Pen Needle 32G X 8 MM MISC Use as directed 10/26/15   Ghimire, Henreitta Leber, MD  insulin regular human CONCENTRATED (HUMULIN R U-500 KWIKPEN) 500 UNIT/ML kwikpen INJECT SUBCUTANEOUSLY 30 UNITS AT BREAKFAST, 35 UNITS AT LUNCH AND 30 UNITS BEFORE SUPPER 07/26/17   Elayne Snare, MD  labetalol (NORMODYNE) 100 MG tablet TAKE 1 TABLET THREE TIMES DAILY 03/20/16   Hoyt Koch, MD  Lancets (ACCU-CHEK MULTICLIX) lancets Check blood sugar four times daily 09/02/16   Elayne Snare, MD  metoprolol succinate (TOPROL-XL) 25 MG 24 hr tablet Take 1 tablet (25 mg total) by mouth daily. 05/26/17   Elayne Snare, MD  midodrine (PROAMATINE) 10 MG tablet Take 10 mg by mouth as needed  (low blood pressure).  01/08/17   [provider]  ofloxacin (FLOXIN) 0.3 % OTIC solution Place 10 drops into the right ear daily. For a total of 7 days 05/19/17   Rosemarie Ax, MD  omeprazole (PRILOSEC) 20 MG capsule TAKE 1 CAPSULE EVERY DAY 07/05/17   Hoyt Koch, MD  RENVELA 800 MG tablet Take 2,400 mg by mouth 3 (three) times daily with meals.  02/02/17   [provider]  rosuvastatin (CRESTOR) 20 MG tablet TAKE 1 TABLET AT BEDTIME 03/18/17   Hoyt Koch, MD  silver sulfADIAZINE (SILVADENE) 1 % cream Apply 1 application topically  daily. 08/03/17   Recardo Evangelist, PA-C  traMADol (ULTRAM) 50 MG tablet Take 1 tablet (50 mg total) by mouth every 8 (eight) hours as needed. 12/02/15   Hoyt Koch, MD  VELTASSA 8.4 g packet Take 8.4 g by mouth daily.  12/30/16   [provider]  warfarin (COUMADIN) 5 MG tablet Take 1 (5 mg) tablet every day except for Wednesday & Saturday when you take 7.32m (1.5 tablets) OR As directed. 07/05/17   CHoyt Koch MD    Family History Family History  Problem Relation Age of Onset  . Hypertension Mother   . Diabetes Father     Social History Social History  Substance Use Topics  . Smoking status: Never Smoker  . Smokeless tobacco: Never Used  . Alcohol use No     Allergies   Pork-derived products; Ace inhibitors; and Hydrocodone   Review of Systems Review of Systems  Constitutional: Negative for fever.  Musculoskeletal: Negative for arthralgias.  Skin: Positive for wound.       +burns     Physical Exam Updated Vital Signs BP (!) 115/33 (BP Location: Left Arm)   Pulse 92   Temp 98 F (36.7 C) (Oral)   Resp 18   Ht _0  (1.778 m)   Wt 133.8 kg (295 lb)   SpO2 97%   BMI 42.33 kg/m   Physical Exam  Constitutional: He is oriented to person, place, and time. He appears well-developed and well-nourished. No distress.  HENT:  Head: Normocephalic and atraumatic.  Eyes: Pupils  are equal, round, and reactive to light. Conjunctivae are normal. Right eye exhibits no discharge. Left eye exhibits no discharge. No scleral icterus.  Neck: Normal range of motion.  Cardiovascular: Normal rate.   Pulmonary/Chest: Effort normal. No respiratory distress.  Abdominal: He exhibits no distension.  Neurological: He is alert and oriented to person, place, and time.  Skin: Skin is warm and dry.  Left foot: 2nd degree burn with blood blisters, some which have drained. Erythema extends to mid foot. See picture for details  Right foot: Intact bullae of the 2nd toe and dorsal aspect of right foot. Erythema extends to mid foot. See picture for details.  Psychiatric: He has a normal mood and affect. His behavior is normal.  Nursing note and vitals reviewed.        ED Treatments / Results  Labs (all labs ordered are listed, but only abnormal results are displayed) Labs Reviewed - No data to display  EKG  EKG Interpretation None       Radiology No results found.  Procedures Procedures (including critical care time)  Medications Ordered in ED Medications  Tdap (BOOSTRIX) injection 0.5 mL (not administered)  silver sulfADIAZINE (SILVADENE) 1 % cream ( Topical Given 08/03/17 1628)     Initial Impression / Assessment and Plan / ED Course  I have reviewed the triage vital signs and the nursing notes.  Pertinent labs & imaging results that were available during my care of the patient were reviewed by me and considered in my medical decision making (see chart for details).  51year old male presents with 2nd degree burns of bilateral feet. He has been dressing wounds appropriately. Will add silvadene and discussed wound care. Rx for Keflex was given due to his poorly controlled Diabetes. Tdap was ordered but patient left before this was administered. I gave him strict return precautions for any signs of infection. He verbalized understanding.  Final Clinical  Impressions(s) /  ED Diagnoses   Final diagnoses:  Burn, foot, second degree, left, initial encounter  Partial thickness burn of right foot, initial encounter    New Prescriptions New Prescriptions   SILVER SULFADIAZINE (SILVADENE) 1 % CREAM    Apply 1 application topically daily.     Recardo Evangelist, PA-C 08/03/17 1810    Forde Dandy, MD 08/05/17 Adelfa Koh

## 2017-08-03 NOTE — Telephone Encounter (Signed)
Patient left without Rx for antibiotic. Patient called and made aware that we needed to call in an RX - patient gave preferred Pharmacy and Rx called into the [facility] on [address]
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
## 2017-08-05 DIAGNOSIS — Z23 Encounter for immunization: Secondary | ICD-10-CM | POA: Diagnosis not present

## 2017-08-05 DIAGNOSIS — M109 Gout, unspecified: Secondary | ICD-10-CM | POA: Diagnosis not present

## 2017-08-05 DIAGNOSIS — E1129 Type 2 diabetes mellitus with other diabetic kidney complication: Secondary | ICD-10-CM | POA: Diagnosis not present

## 2017-08-05 DIAGNOSIS — E1122 Type 2 diabetes mellitus with diabetic chronic kidney disease: Secondary | ICD-10-CM | POA: Diagnosis not present

## 2017-08-05 DIAGNOSIS — N186 End stage renal disease: Secondary | ICD-10-CM | POA: Diagnosis not present

## 2017-08-05 DIAGNOSIS — N2581 Secondary hyperparathyroidism of renal origin: Secondary | ICD-10-CM | POA: Diagnosis not present

## 2017-08-05 DIAGNOSIS — D631 Anemia in chronic kidney disease: Secondary | ICD-10-CM | POA: Diagnosis not present

## 2017-08-07 DIAGNOSIS — N2581 Secondary hyperparathyroidism of renal origin: Secondary | ICD-10-CM | POA: Diagnosis not present

## 2017-08-07 DIAGNOSIS — Z23 Encounter for immunization: Secondary | ICD-10-CM | POA: Diagnosis not present

## 2017-08-07 DIAGNOSIS — D631 Anemia in chronic kidney disease: Secondary | ICD-10-CM | POA: Diagnosis not present

## 2017-08-07 DIAGNOSIS — E1122 Type 2 diabetes mellitus with diabetic chronic kidney disease: Secondary | ICD-10-CM | POA: Diagnosis not present

## 2017-08-07 DIAGNOSIS — N186 End stage renal disease: Secondary | ICD-10-CM | POA: Diagnosis not present

## 2017-08-09 ENCOUNTER — Ambulatory Visit (INDEPENDENT_AMBULATORY_CARE_PROVIDER_SITE_OTHER): Payer: Medicare Other | Admitting: Podiatry

## 2017-08-09 ENCOUNTER — Encounter: Payer: Self-pay | Admitting: Internal Medicine

## 2017-08-09 ENCOUNTER — Ambulatory Visit (INDEPENDENT_AMBULATORY_CARE_PROVIDER_SITE_OTHER): Payer: Medicare Other | Admitting: Internal Medicine

## 2017-08-09 ENCOUNTER — Encounter: Payer: Self-pay | Admitting: Podiatry

## 2017-08-09 ENCOUNTER — Other Ambulatory Visit: Payer: Medicare Other

## 2017-08-09 VITALS — BP 130/70 | HR 90 | Temp 98.4°F | Ht 70.0 in

## 2017-08-09 VITALS — BP 138/68 | HR 91 | Temp 97.1°F | Resp 18

## 2017-08-09 DIAGNOSIS — Z7184 Encounter for health counseling related to travel: Secondary | ICD-10-CM

## 2017-08-09 DIAGNOSIS — S91309A Unspecified open wound, unspecified foot, initial encounter: Secondary | ICD-10-CM | POA: Diagnosis not present

## 2017-08-09 DIAGNOSIS — T300 Burn of unspecified body region, unspecified degree: Secondary | ICD-10-CM

## 2017-08-09 DIAGNOSIS — Z7189 Other specified counseling: Secondary | ICD-10-CM

## 2017-08-09 MED ORDER — SILVER SULFADIAZINE 1 % EX CREA: 1.0000 "application " | TOPICAL_CREAM | Freq: Every day | CUTANEOUS | 2 refills | Status: DC

## 2017-08-09 MED ORDER — CEPHALEXIN 500 MG PO CAPS: 500.0000 mg | ORAL_CAPSULE | Freq: Two times a day (BID) | ORAL | 2 refills | Status: DC

## 2017-08-09 NOTE — Progress Notes (Signed)
   Subjective:    Patient ID: [name] [name], male    DOB: 04/29/1966, 51 y.o.   MRN: [id number]  HPI The patient is a 51 YO man coming in for pre-travel consultation. He does need some blood work done and vaccines possibly as well as HIV screening. He is going to [place] and he needs to get dialysis while he is over there and they need certain blood work. Denies concerns today,   Review of Systems  Constitutional: Negative.   HENT: Negative.   Respiratory: Negative.   Cardiovascular: Negative.   Gastrointestinal: Negative.   Musculoskeletal: Negative.   Skin: Negative.       Objective:   Physical Exam  Constitutional: He is oriented to person, place, and time. He appears well-developed and well-nourished.  Overweight  HENT:  Head: Normocephalic and atraumatic.  Cardiovascular: Normal rate and regular rhythm.   Pulmonary/Chest: Effort normal and breath sounds normal. No respiratory distress. He has no wheezes.  Abdominal: Soft.  Musculoskeletal: He exhibits no edema.  Neurological: He is alert and oriented to person, place, and time.  Skin: Skin is warm and dry.   Vitals:   08/09/17 1312  BP: 130/70  Pulse: 90  Temp: 98.4 F (36.9 C)  TempSrc: Oral  SpO2: 98%  Height: 5\' 10"  (1.778 m)      Assessment & Plan:  Fax # [phone]

## 2017-08-09 NOTE — Patient Instructions (Signed)
We are checking the HIV test today in the lab.   We have given you the prescription to take to [facility]

## 2017-08-09 NOTE — Assessment & Plan Note (Signed)
Ordered MRSA swab and he will have done at [facility] and HIV screening done today so that he can get dialysis while visiting his brother in [place]. Vaccinations are up to date.

## 2017-08-10 ENCOUNTER — Telehealth: Payer: Self-pay | Admitting: Podiatry

## 2017-08-10 DIAGNOSIS — Z23 Encounter for immunization: Secondary | ICD-10-CM | POA: Diagnosis not present

## 2017-08-10 DIAGNOSIS — N2581 Secondary hyperparathyroidism of renal origin: Secondary | ICD-10-CM | POA: Diagnosis not present

## 2017-08-10 DIAGNOSIS — N186 End stage renal disease: Secondary | ICD-10-CM | POA: Diagnosis not present

## 2017-08-10 DIAGNOSIS — D631 Anemia in chronic kidney disease: Secondary | ICD-10-CM | POA: Diagnosis not present

## 2017-08-10 DIAGNOSIS — E1122 Type 2 diabetes mellitus with diabetic chronic kidney disease: Secondary | ICD-10-CM | POA: Diagnosis not present

## 2017-08-10 LAB — HIV ANTIBODY (ROUTINE TESTING W REFLEX): HIV 1&2 Ab, 4th Generation: NONREACTIVE

## 2017-08-10 NOTE — Telephone Encounter (Signed)
Incomplete form given to Dr. Leigh Aurora assistant, no clinicals available.

## 2017-08-10 NOTE — Telephone Encounter (Signed)
This is [place] with [employer]. We received the faxed order, however it is missing the wound assessment. It did not have drainage amount selected, location of the wound was not provided, qualifying wound section was not selected, and wound measurements are missing as well. We were calling to verify this information. It can be updated and re-faxed to [phone]. If you have any questions, you can reach [place] at [phone]. Thank you so much.

## 2017-08-11 DIAGNOSIS — N186 End stage renal disease: Secondary | ICD-10-CM | POA: Diagnosis not present

## 2017-08-11 DIAGNOSIS — E1122 Type 2 diabetes mellitus with diabetic chronic kidney disease: Secondary | ICD-10-CM | POA: Diagnosis not present

## 2017-08-11 DIAGNOSIS — Z992 Dependence on renal dialysis: Secondary | ICD-10-CM | POA: Diagnosis not present

## 2017-08-11 NOTE — Telephone Encounter (Signed)
L. Cox faxed the completed [employer] form to [employer].

## 2017-08-12 ENCOUNTER — Telehealth: Payer: Self-pay | Admitting: *Deleted

## 2017-08-12 DIAGNOSIS — N186 End stage renal disease: Secondary | ICD-10-CM | POA: Diagnosis not present

## 2017-08-12 DIAGNOSIS — I82492 Acute embolism and thrombosis of other specified deep vein of left lower extremity: Secondary | ICD-10-CM | POA: Diagnosis not present

## 2017-08-12 DIAGNOSIS — E1122 Type 2 diabetes mellitus with diabetic chronic kidney disease: Secondary | ICD-10-CM | POA: Diagnosis not present

## 2017-08-12 DIAGNOSIS — N2581 Secondary hyperparathyroidism of renal origin: Secondary | ICD-10-CM | POA: Diagnosis not present

## 2017-08-12 DIAGNOSIS — E1129 Type 2 diabetes mellitus with other diabetic kidney complication: Secondary | ICD-10-CM | POA: Diagnosis not present

## 2017-08-12 NOTE — Telephone Encounter (Signed)
Pt states he received his supplies, but only gauze. I asked Dr. Jacqualyn Posey and he stated pt was to get adaptic and paper tape. I told pt I would order the remaining supplies, and he could pick up supplies from our office to use until the new supplies arrived. Pt states he will pick up tomorrow. I placed 3 Adaptic, 1 roll of 2" Micropore tape in a bag and labeled with pt's name. Faxed orders for the adaptic and 1" Micropore tape to [facility].

## 2017-08-13 DIAGNOSIS — E1169 Type 2 diabetes mellitus with other specified complication: Secondary | ICD-10-CM | POA: Diagnosis not present

## 2017-08-13 DIAGNOSIS — Z992 Dependence on renal dialysis: Secondary | ICD-10-CM | POA: Diagnosis not present

## 2017-08-13 DIAGNOSIS — E669 Obesity, unspecified: Secondary | ICD-10-CM | POA: Diagnosis not present

## 2017-08-13 DIAGNOSIS — Z940 Kidney transplant status: Secondary | ICD-10-CM | POA: Diagnosis not present

## 2017-08-13 DIAGNOSIS — Z7901 Long term (current) use of anticoagulants: Secondary | ICD-10-CM | POA: Diagnosis not present

## 2017-08-13 DIAGNOSIS — I10 Essential (primary) hypertension: Secondary | ICD-10-CM | POA: Diagnosis not present

## 2017-08-13 DIAGNOSIS — N186 End stage renal disease: Secondary | ICD-10-CM | POA: Diagnosis not present

## 2017-08-13 DIAGNOSIS — Z5181 Encounter for therapeutic drug level monitoring: Secondary | ICD-10-CM | POA: Diagnosis not present

## 2017-08-14 DIAGNOSIS — E1129 Type 2 diabetes mellitus with other diabetic kidney complication: Secondary | ICD-10-CM | POA: Diagnosis not present

## 2017-08-14 DIAGNOSIS — E1122 Type 2 diabetes mellitus with diabetic chronic kidney disease: Secondary | ICD-10-CM | POA: Diagnosis not present

## 2017-08-14 DIAGNOSIS — N186 End stage renal disease: Secondary | ICD-10-CM | POA: Diagnosis not present

## 2017-08-14 DIAGNOSIS — N2581 Secondary hyperparathyroidism of renal origin: Secondary | ICD-10-CM | POA: Diagnosis not present

## 2017-08-15 DIAGNOSIS — T300 Burn of unspecified body region, unspecified degree: Secondary | ICD-10-CM | POA: Insufficient documentation
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
## 2017-08-16 ENCOUNTER — Telehealth: Payer: Self-pay

## 2017-08-16 ENCOUNTER — Encounter: Payer: Self-pay | Admitting: Podiatry

## 2017-08-16 ENCOUNTER — Ambulatory Visit (INDEPENDENT_AMBULATORY_CARE_PROVIDER_SITE_OTHER): Payer: Medicare Other | Admitting: Podiatry

## 2017-08-16 VITALS — Temp 97.5°F

## 2017-08-16 DIAGNOSIS — S91309A Unspecified open wound, unspecified foot, initial encounter: Secondary | ICD-10-CM | POA: Diagnosis not present

## 2017-08-16 DIAGNOSIS — T300 Burn of unspecified body region, unspecified degree: Secondary | ICD-10-CM

## 2017-08-16 NOTE — Telephone Encounter (Signed)
Patient went to [facility] to get MRSA swab done and [facility] called stating that they do not do any types of swabs there. Told the nurse that I would send you a note and get a hold of patient after that

## 2017-08-16 NOTE — Telephone Encounter (Signed)
Call and let patient know that we cannot do the swab. The hospital could if he needs done could go to urgent care possibly.

## 2017-08-17 DIAGNOSIS — N2581 Secondary hyperparathyroidism of renal origin: Secondary | ICD-10-CM | POA: Diagnosis not present

## 2017-08-17 DIAGNOSIS — N186 End stage renal disease: Secondary | ICD-10-CM | POA: Diagnosis not present

## 2017-08-17 DIAGNOSIS — E1129 Type 2 diabetes mellitus with other diabetic kidney complication: Secondary | ICD-10-CM | POA: Diagnosis not present

## 2017-08-17 DIAGNOSIS — E1122 Type 2 diabetes mellitus with diabetic chronic kidney disease: Secondary | ICD-10-CM | POA: Diagnosis not present

## 2017-08-18 ENCOUNTER — Telehealth: Payer: Self-pay | Admitting: Endocrinology
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
## 2017-08-18 NOTE — Telephone Encounter (Signed)
[facility] and [facility]ab---orders available would be for MRSA BiPCR nasal swab, order #[id number]  OR  MRSA culture, order #[id number]---script needs to have our account number [id number] on it when patient goes, patient needs to call and schedule appt before going---see tamara if any questions

## 2017-08-18 NOTE — Telephone Encounter (Signed)
Called patient and let him know that it may be a few more days before this form is completed. He asked to receive a call once it is done.

## 2017-08-18 NOTE — Telephone Encounter (Signed)
It may be a few days before I can complete this

## 2017-08-18 NOTE — Telephone Encounter (Signed)
Have you completed this form? Please advise.

## 2017-08-18 NOTE — Telephone Encounter (Signed)
Patient is calling on the status of his dmv form, please advise

## 2017-08-19 DIAGNOSIS — I82492 Acute embolism and thrombosis of other specified deep vein of left lower extremity: Secondary | ICD-10-CM | POA: Diagnosis not present

## 2017-08-19 DIAGNOSIS — N186 End stage renal disease: Secondary | ICD-10-CM | POA: Diagnosis not present

## 2017-08-19 DIAGNOSIS — E1122 Type 2 diabetes mellitus with diabetic chronic kidney disease: Secondary | ICD-10-CM | POA: Diagnosis not present

## 2017-08-19 DIAGNOSIS — N2581 Secondary hyperparathyroidism of renal origin: Secondary | ICD-10-CM | POA: Diagnosis not present

## 2017-08-19 DIAGNOSIS — E1129 Type 2 diabetes mellitus with other diabetic kidney complication: Secondary | ICD-10-CM | POA: Diagnosis not present

## 2017-08-19 LAB — PROTIME-INR: INR: 2.1 — AB (ref <=1.1)

## 2017-08-19 NOTE — Telephone Encounter (Signed)
Signed script and given to [facility]

## 2017-08-19 NOTE — Telephone Encounter (Signed)
Rx and letter with address, hours of operation, and phone number of [facility] on [address] is attached placed in envelope up front

## 2017-08-21 DIAGNOSIS — E1129 Type 2 diabetes mellitus with other diabetic kidney complication: Secondary | ICD-10-CM | POA: Diagnosis not present

## 2017-08-21 DIAGNOSIS — E1122 Type 2 diabetes mellitus with diabetic chronic kidney disease: Secondary | ICD-10-CM | POA: Diagnosis not present

## 2017-08-21 DIAGNOSIS — N186 End stage renal disease: Secondary | ICD-10-CM | POA: Diagnosis not present

## 2017-08-21 DIAGNOSIS — N2581 Secondary hyperparathyroidism of renal origin: Secondary | ICD-10-CM | POA: Diagnosis not present

## 2017-08-23 ENCOUNTER — Ambulatory Visit: Payer: Medicare Other | Admitting: Podiatry

## 2017-08-23 DIAGNOSIS — I871 Compression of vein: Secondary | ICD-10-CM | POA: Diagnosis not present

## 2017-08-23 DIAGNOSIS — N186 End stage renal disease: Secondary | ICD-10-CM | POA: Diagnosis not present

## 2017-08-23 DIAGNOSIS — Z992 Dependence on renal dialysis: Secondary | ICD-10-CM | POA: Diagnosis not present

## 2017-08-23 DIAGNOSIS — I771 Stricture of artery: Secondary | ICD-10-CM | POA: Diagnosis not present

## 2017-08-24 ENCOUNTER — Ambulatory Visit (INDEPENDENT_AMBULATORY_CARE_PROVIDER_SITE_OTHER): Payer: Medicare Other | Admitting: General Practice

## 2017-08-24 DIAGNOSIS — Z7901 Long term (current) use of anticoagulants: Secondary | ICD-10-CM

## 2017-08-24 DIAGNOSIS — N186 End stage renal disease: Secondary | ICD-10-CM | POA: Diagnosis not present

## 2017-08-24 DIAGNOSIS — N2581 Secondary hyperparathyroidism of renal origin: Secondary | ICD-10-CM | POA: Diagnosis not present

## 2017-08-24 DIAGNOSIS — E1122 Type 2 diabetes mellitus with diabetic chronic kidney disease: Secondary | ICD-10-CM | POA: Diagnosis not present

## 2017-08-24 DIAGNOSIS — E1129 Type 2 diabetes mellitus with other diabetic kidney complication: Secondary | ICD-10-CM | POA: Diagnosis not present

## 2017-08-24 NOTE — Progress Notes (Signed)
Medical treatment/procedure(s) were performed by non-physician practitioner and as supervising physician I was immediately available for consultation/collaboration. I agree with above. Elizabeth A Crawford, MD  

## 2017-08-24 NOTE — Patient Instructions (Addendum)
Pre visit review using our clinic review tool, if applicable. No additional management support is needed unless otherwise documented below in the visit note.  Continue to take 1 tablet daily except 1 1/2 tablets on Wednesdays/Saturday.  Re-check in 1 week. Instructions given to patient.  Dialysis Pt.

## 2017-08-26 DIAGNOSIS — I82492 Acute embolism and thrombosis of other specified deep vein of left lower extremity: Secondary | ICD-10-CM | POA: Diagnosis not present

## 2017-08-26 DIAGNOSIS — E1129 Type 2 diabetes mellitus with other diabetic kidney complication: Secondary | ICD-10-CM | POA: Diagnosis not present

## 2017-08-26 DIAGNOSIS — N186 End stage renal disease: Secondary | ICD-10-CM | POA: Diagnosis not present

## 2017-08-26 DIAGNOSIS — N2581 Secondary hyperparathyroidism of renal origin: Secondary | ICD-10-CM | POA: Diagnosis not present

## 2017-08-26 DIAGNOSIS — E1122 Type 2 diabetes mellitus with diabetic chronic kidney disease: Secondary | ICD-10-CM | POA: Diagnosis not present

## 2017-08-26 LAB — PROTIME-INR: INR: 1.9 — AB (ref <=1.1)

## 2017-08-26 NOTE — Telephone Encounter (Signed)
Patient came into our office yesterday and came to pick up his completed forms from Dr. Dwyane Dee. I gave these to patient and I also made a copy to have scanned in his chart.

## 2017-08-27 ENCOUNTER — Ambulatory Visit (INDEPENDENT_AMBULATORY_CARE_PROVIDER_SITE_OTHER): Payer: Medicare Other | Admitting: Podiatry

## 2017-08-27 ENCOUNTER — Encounter: Payer: Self-pay | Admitting: Podiatry

## 2017-08-27 DIAGNOSIS — T300 Burn of unspecified body region, unspecified degree: Secondary | ICD-10-CM | POA: Diagnosis not present

## 2017-08-27 MED ORDER — SILVER SULFADIAZINE 1 % EX CREA: 1.0000 "application " | TOPICAL_CREAM | Freq: Every day | CUTANEOUS | 0 refills | Status: DC

## 2017-08-28 DIAGNOSIS — E1122 Type 2 diabetes mellitus with diabetic chronic kidney disease: Secondary | ICD-10-CM | POA: Diagnosis not present

## 2017-08-28 DIAGNOSIS — E1129 Type 2 diabetes mellitus with other diabetic kidney complication: Secondary | ICD-10-CM | POA: Diagnosis not present

## 2017-08-28 DIAGNOSIS — N186 End stage renal disease: Secondary | ICD-10-CM | POA: Diagnosis not present

## 2017-08-28 DIAGNOSIS — N2581 Secondary hyperparathyroidism of renal origin: Secondary | ICD-10-CM | POA: Diagnosis not present

## 2017-08-30 DIAGNOSIS — N2581 Secondary hyperparathyroidism of renal origin: Secondary | ICD-10-CM | POA: Diagnosis not present

## 2017-08-30 DIAGNOSIS — N186 End stage renal disease: Secondary | ICD-10-CM | POA: Diagnosis not present

## 2017-08-30 DIAGNOSIS — E1122 Type 2 diabetes mellitus with diabetic chronic kidney disease: Secondary | ICD-10-CM | POA: Diagnosis not present

## 2017-08-30 DIAGNOSIS — E1129 Type 2 diabetes mellitus with other diabetic kidney complication: Secondary | ICD-10-CM | POA: Diagnosis not present
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
## 2017-08-31 ENCOUNTER — Ambulatory Visit (INDEPENDENT_AMBULATORY_CARE_PROVIDER_SITE_OTHER): Payer: Medicare Other | Admitting: General Practice

## 2017-08-31 ENCOUNTER — Other Ambulatory Visit (HOSPITAL_COMMUNITY): Payer: Self-pay | Admitting: Surgery

## 2017-08-31 DIAGNOSIS — I10 Essential (primary) hypertension: Secondary | ICD-10-CM

## 2017-08-31 DIAGNOSIS — Z7901 Long term (current) use of anticoagulants: Secondary | ICD-10-CM | POA: Diagnosis not present

## 2017-08-31 NOTE — Patient Instructions (Addendum)
Pre visit review using our clinic review tool, if applicable. No additional management support is needed unless otherwise documented below in the visit note.  ake extra 1/2 tablet today and then continue to take 1 tablet daily except 1 1/2 tablets on Wednesdays/Saturday.  Re-check in 1 week. Instructions given to patient.  Dialysis Pt.

## 2017-08-31 NOTE — Progress Notes (Signed)
Medical treatment/procedure(s) were performed by non-physician practitioner and as supervising physician I was immediately available for consultation/collaboration. I agree with above. Elizabeth A Crawford, MD  

## 2017-09-01 DIAGNOSIS — N2581 Secondary hyperparathyroidism of renal origin: Secondary | ICD-10-CM | POA: Diagnosis not present

## 2017-09-01 DIAGNOSIS — I82492 Acute embolism and thrombosis of other specified deep vein of left lower extremity: Secondary | ICD-10-CM | POA: Diagnosis not present

## 2017-09-01 DIAGNOSIS — N186 End stage renal disease: Secondary | ICD-10-CM | POA: Diagnosis not present

## 2017-09-01 DIAGNOSIS — E1129 Type 2 diabetes mellitus with other diabetic kidney complication: Secondary | ICD-10-CM | POA: Diagnosis not present

## 2017-09-01 DIAGNOSIS — E1122 Type 2 diabetes mellitus with diabetic chronic kidney disease: Secondary | ICD-10-CM | POA: Diagnosis not present

## 2017-09-04 DIAGNOSIS — N186 End stage renal disease: Secondary | ICD-10-CM | POA: Diagnosis not present

## 2017-09-04 DIAGNOSIS — N2581 Secondary hyperparathyroidism of renal origin: Secondary | ICD-10-CM | POA: Diagnosis not present

## 2017-09-04 DIAGNOSIS — E1129 Type 2 diabetes mellitus with other diabetic kidney complication: Secondary | ICD-10-CM | POA: Diagnosis not present

## 2017-09-04 DIAGNOSIS — E1122 Type 2 diabetes mellitus with diabetic chronic kidney disease: Secondary | ICD-10-CM | POA: Diagnosis not present

## 2017-09-07 DIAGNOSIS — E1129 Type 2 diabetes mellitus with other diabetic kidney complication: Secondary | ICD-10-CM | POA: Diagnosis not present

## 2017-09-07 DIAGNOSIS — N2581 Secondary hyperparathyroidism of renal origin: Secondary | ICD-10-CM | POA: Diagnosis not present

## 2017-09-07 DIAGNOSIS — N186 End stage renal disease: Secondary | ICD-10-CM | POA: Diagnosis not present

## 2017-09-07 DIAGNOSIS — E1122 Type 2 diabetes mellitus with diabetic chronic kidney disease: Secondary | ICD-10-CM | POA: Diagnosis not present

## 2017-09-09 DIAGNOSIS — I82492 Acute embolism and thrombosis of other specified deep vein of left lower extremity: Secondary | ICD-10-CM | POA: Diagnosis not present

## 2017-09-09 DIAGNOSIS — N2581 Secondary hyperparathyroidism of renal origin: Secondary | ICD-10-CM | POA: Diagnosis not present

## 2017-09-09 DIAGNOSIS — E1129 Type 2 diabetes mellitus with other diabetic kidney complication: Secondary | ICD-10-CM | POA: Diagnosis not present

## 2017-09-09 DIAGNOSIS — N186 End stage renal disease: Secondary | ICD-10-CM | POA: Diagnosis not present

## 2017-09-09 DIAGNOSIS — E1122 Type 2 diabetes mellitus with diabetic chronic kidney disease: Secondary | ICD-10-CM | POA: Diagnosis not present

## 2017-09-09 DIAGNOSIS — M109 Gout, unspecified: Secondary | ICD-10-CM | POA: Diagnosis not present

## 2017-09-10 DIAGNOSIS — E1122 Type 2 diabetes mellitus with diabetic chronic kidney disease: Secondary | ICD-10-CM | POA: Diagnosis not present

## 2017-09-10 DIAGNOSIS — N186 End stage renal disease: Secondary | ICD-10-CM | POA: Diagnosis not present

## 2017-09-10 DIAGNOSIS — Z992 Dependence on renal dialysis: Secondary | ICD-10-CM | POA: Diagnosis not present

## 2017-09-11 DIAGNOSIS — E1122 Type 2 diabetes mellitus with diabetic chronic kidney disease: Secondary | ICD-10-CM | POA: Diagnosis not present

## 2017-09-11 DIAGNOSIS — N2581 Secondary hyperparathyroidism of renal origin: Secondary | ICD-10-CM | POA: Diagnosis not present

## 2017-09-11 DIAGNOSIS — E8770 Fluid overload, unspecified: Secondary | ICD-10-CM | POA: Diagnosis not present

## 2017-09-11 DIAGNOSIS — E1129 Type 2 diabetes mellitus with other diabetic kidney complication: Secondary | ICD-10-CM | POA: Diagnosis not present

## 2017-09-11 DIAGNOSIS — N186 End stage renal disease: Secondary | ICD-10-CM | POA: Diagnosis not present

## 2017-09-13 ENCOUNTER — Encounter: Payer: Self-pay | Admitting: Podiatry

## 2017-09-13 ENCOUNTER — Ambulatory Visit (INDEPENDENT_AMBULATORY_CARE_PROVIDER_SITE_OTHER): Payer: Medicare Other | Admitting: Podiatry

## 2017-09-13 ENCOUNTER — Ambulatory Visit (INDEPENDENT_AMBULATORY_CARE_PROVIDER_SITE_OTHER): Payer: Medicare Other

## 2017-09-13 VITALS — BP 106/51 | HR 98 | Temp 97.7°F

## 2017-09-13 DIAGNOSIS — S82892A Other fracture of left lower leg, initial encounter for closed fracture: Secondary | ICD-10-CM | POA: Diagnosis not present

## 2017-09-13 DIAGNOSIS — N186 End stage renal disease: Secondary | ICD-10-CM | POA: Diagnosis not present

## 2017-09-13 DIAGNOSIS — N2581 Secondary hyperparathyroidism of renal origin: Secondary | ICD-10-CM | POA: Diagnosis not present

## 2017-09-13 DIAGNOSIS — L601 Onycholysis: Secondary | ICD-10-CM

## 2017-09-13 DIAGNOSIS — E8770 Fluid overload, unspecified: Secondary | ICD-10-CM | POA: Diagnosis not present

## 2017-09-13 DIAGNOSIS — T300 Burn of unspecified body region, unspecified degree: Secondary | ICD-10-CM

## 2017-09-13 DIAGNOSIS — E1122 Type 2 diabetes mellitus with diabetic chronic kidney disease: Secondary | ICD-10-CM | POA: Diagnosis not present

## 2017-09-13 DIAGNOSIS — S99912A Unspecified injury of left ankle, initial encounter: Secondary | ICD-10-CM | POA: Diagnosis not present

## 2017-09-13 DIAGNOSIS — E1129 Type 2 diabetes mellitus with other diabetic kidney complication: Secondary | ICD-10-CM | POA: Diagnosis not present

## 2017-09-14 DIAGNOSIS — N186 End stage renal disease: Secondary | ICD-10-CM | POA: Diagnosis not present

## 2017-09-14 DIAGNOSIS — E1122 Type 2 diabetes mellitus with diabetic chronic kidney disease: Secondary | ICD-10-CM | POA: Diagnosis not present

## 2017-09-14 DIAGNOSIS — E8770 Fluid overload, unspecified: Secondary | ICD-10-CM | POA: Diagnosis not present

## 2017-09-14 DIAGNOSIS — E1129 Type 2 diabetes mellitus with other diabetic kidney complication: Secondary | ICD-10-CM | POA: Diagnosis not present

## 2017-09-14 DIAGNOSIS — N2581 Secondary hyperparathyroidism of renal origin: Secondary | ICD-10-CM | POA: Diagnosis not present

## 2017-09-15 ENCOUNTER — Ambulatory Visit (HOSPITAL_COMMUNITY): Payer: Medicare Other
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
## 2017-09-16 DIAGNOSIS — I82492 Acute embolism and thrombosis of other specified deep vein of left lower extremity: Secondary | ICD-10-CM | POA: Diagnosis not present

## 2017-09-16 DIAGNOSIS — E8770 Fluid overload, unspecified: Secondary | ICD-10-CM | POA: Diagnosis not present

## 2017-09-16 DIAGNOSIS — E1129 Type 2 diabetes mellitus with other diabetic kidney complication: Secondary | ICD-10-CM | POA: Diagnosis not present

## 2017-09-16 DIAGNOSIS — E1122 Type 2 diabetes mellitus with diabetic chronic kidney disease: Secondary | ICD-10-CM | POA: Diagnosis not present

## 2017-09-16 DIAGNOSIS — N2581 Secondary hyperparathyroidism of renal origin: Secondary | ICD-10-CM | POA: Diagnosis not present

## 2017-09-16 DIAGNOSIS — N186 End stage renal disease: Secondary | ICD-10-CM | POA: Diagnosis not present

## 2017-09-16 LAB — PROTIME-INR: INR: 2.2 — AB (ref <=1.1)

## 2017-09-18 DIAGNOSIS — N2581 Secondary hyperparathyroidism of renal origin: Secondary | ICD-10-CM | POA: Diagnosis not present

## 2017-09-18 DIAGNOSIS — E8770 Fluid overload, unspecified: Secondary | ICD-10-CM | POA: Diagnosis not present

## 2017-09-18 DIAGNOSIS — E1122 Type 2 diabetes mellitus with diabetic chronic kidney disease: Secondary | ICD-10-CM | POA: Diagnosis not present

## 2017-09-18 DIAGNOSIS — E1129 Type 2 diabetes mellitus with other diabetic kidney complication: Secondary | ICD-10-CM | POA: Diagnosis not present

## 2017-09-18 DIAGNOSIS — N186 End stage renal disease: Secondary | ICD-10-CM | POA: Diagnosis not present

## 2017-09-20 ENCOUNTER — Inpatient Hospital Stay (HOSPITAL_COMMUNITY): Admission: RE | Admit: 2017-09-20 | Payer: Medicare Other | Source: Ambulatory Visit

## 2017-09-20 ENCOUNTER — Ambulatory Visit: Payer: Self-pay | Admitting: Skilled Nursing Facility1

## 2017-09-21 DIAGNOSIS — E8770 Fluid overload, unspecified: Secondary | ICD-10-CM | POA: Diagnosis not present

## 2017-09-21 DIAGNOSIS — E1129 Type 2 diabetes mellitus with other diabetic kidney complication: Secondary | ICD-10-CM | POA: Diagnosis not present

## 2017-09-21 DIAGNOSIS — E1122 Type 2 diabetes mellitus with diabetic chronic kidney disease: Secondary | ICD-10-CM | POA: Diagnosis not present

## 2017-09-21 DIAGNOSIS — N2581 Secondary hyperparathyroidism of renal origin: Secondary | ICD-10-CM | POA: Diagnosis not present

## 2017-09-21 DIAGNOSIS — N186 End stage renal disease: Secondary | ICD-10-CM | POA: Diagnosis not present

## 2017-09-23 DIAGNOSIS — E8770 Fluid overload, unspecified: Secondary | ICD-10-CM | POA: Diagnosis not present

## 2017-09-23 DIAGNOSIS — I82492 Acute embolism and thrombosis of other specified deep vein of left lower extremity: Secondary | ICD-10-CM | POA: Diagnosis not present

## 2017-09-23 DIAGNOSIS — E1129 Type 2 diabetes mellitus with other diabetic kidney complication: Secondary | ICD-10-CM | POA: Diagnosis not present

## 2017-09-23 DIAGNOSIS — E1122 Type 2 diabetes mellitus with diabetic chronic kidney disease: Secondary | ICD-10-CM | POA: Diagnosis not present

## 2017-09-23 DIAGNOSIS — N2581 Secondary hyperparathyroidism of renal origin: Secondary | ICD-10-CM | POA: Diagnosis not present

## 2017-09-23 DIAGNOSIS — N186 End stage renal disease: Secondary | ICD-10-CM | POA: Diagnosis not present

## 2017-09-24 ENCOUNTER — Ambulatory Visit (INDEPENDENT_AMBULATORY_CARE_PROVIDER_SITE_OTHER): Payer: Medicare Other | Admitting: General Practice

## 2017-09-24 ENCOUNTER — Encounter: Payer: Medicare Other | Attending: Surgery | Admitting: Skilled Nursing Facility1

## 2017-09-24 DIAGNOSIS — I10 Essential (primary) hypertension: Secondary | ICD-10-CM | POA: Insufficient documentation

## 2017-09-24 DIAGNOSIS — N186 End stage renal disease: Secondary | ICD-10-CM

## 2017-09-24 DIAGNOSIS — Z7901 Long term (current) use of anticoagulants: Secondary | ICD-10-CM | POA: Diagnosis not present

## 2017-09-24 DIAGNOSIS — Z713 Dietary counseling and surveillance: Secondary | ICD-10-CM | POA: Insufficient documentation

## 2017-09-24 DIAGNOSIS — Z992 Dependence on renal dialysis: Secondary | ICD-10-CM

## 2017-09-24 NOTE — Progress Notes (Signed)
Medical treatment/procedure(s) were performed by non-physician practitioner and as supervising physician I was immediately available for consultation/collaboration. I agree with above. Gabriana Wilmott A Raheim Beutler, MD  

## 2017-09-24 NOTE — Progress Notes (Signed)
Pre-Op Assessment Visit:  Pre-Operative  Surgery  Medical Nutrition Therapy:  Appt start time: 9:45  End time:  10:45  Patient was seen on 09/24/2017 for Pre-Operative Nutrition Assessment. Assessment and letter of approval faxed to [facility] Bariatric Surgery Program coordinator on 09/24/2017.   Pt states he lost his balance and mobility after sepsis from a kidney transplant so now uses an electric wheel chair. Pt states he checks his blood sugars 3 times a day: fasting 79, 81, 110, 102 and 2 hours after eating 159-200. Pt states bread is his problem. Pt admits to not fully understanding the surgery: Dietitian educated the pt on this: pt now has a better understanding of the surgery and its possible complications  known through teach back. Pt admits to being afraid of the surgery. Pt states he has 3 low blood sugars a week: 60, 54. Pt states he works with [employer].   Pt expectation of surgery: to lose weight in his stomach  Pt expectation of Dietitian: to help me  Start weight at [facility]: 287 BMI: 41.18  24 hr Dietary Recall: First Meal: grits or beard egg and cheese Snack:  Second Meal: skipped Snack: cheesy potatoes Third Meal: rice or mashed potatoes and sometimes chicken or fish and peas or chic peas or hummus and falafel  Snack:  Beverages: gingerale, ginger beer, coconut water  Encouraged to engage in 50 minutes of moderate physical activity including cardiovascular and weight baring weekly  Handouts given during visit include:  . Pre-Op Goals . Bariatric Surgery Protein Shakes During the appointment today the following Pre-Op Goals were reviewed with the patient: . Maintain or lose weight as instructed by your surgeon . Make healthy food choices . Begin to limit portion sizes . Limited concentrated sugars and fried foods . Keep fat/sugar in the single digits per serving on             food labels . Practice CHEWING your food  (aim for 30 chews per bite  or until applesauce consistency) . Practice not drinking 15 minutes before, during, and 30 minutes after each meal/snack . Avoid all carbonated beverages  . Avoid/limit caffeinated beverages  . Avoid all sugar-sweetened beverages . Consume 3 meals per day; eat every 3-5 hours . Make a list of non-food related activities . Aim for 64-100 ounces of FLUID daily  . Aim for at least 60-80 grams of PROTEIN daily . Look for a liquid protein source that contain ?15 g protein and ?5 g carbohydrate  (ex: shakes, drinks, shots) -start taking the renal friendly multivitamin  -use the baritastic app -Follow diet recommendations listed below   Energy and Macronutrient Recomendations: Calories: 1800 Carbohydrate: 200 Protein: 135 Fat: 50  Demonstrated degree of understanding via:  Teach Back  Teaching Method Utilized:  Visual Auditory Hands on  Barriers to learning/adherence to lifestyle change: medical   Patient to call the Nutrition and [facility] Education Services to enroll in Pre-Op and Post-Op Nutrition Education when surgery date is scheduled.

## 2017-09-24 NOTE — Patient Instructions (Addendum)
Pre visit review using our clinic review tool, if applicable. No additional management support is needed unless otherwise documented below in the visit note.  Continue to take 1 tablet daily except 1 1/2 tablets on Wednesdays/Saturday.  Re-check in 1 week. Instructions given to patient.  Dialysis Pt.

## 2017-09-25 DIAGNOSIS — E1122 Type 2 diabetes mellitus with diabetic chronic kidney disease: Secondary | ICD-10-CM | POA: Diagnosis not present

## 2017-09-25 DIAGNOSIS — E1129 Type 2 diabetes mellitus with other diabetic kidney complication: Secondary | ICD-10-CM | POA: Diagnosis not present

## 2017-09-25 DIAGNOSIS — N186 End stage renal disease: Secondary | ICD-10-CM | POA: Diagnosis not present

## 2017-09-25 DIAGNOSIS — N2581 Secondary hyperparathyroidism of renal origin: Secondary | ICD-10-CM | POA: Diagnosis not present

## 2017-09-25 DIAGNOSIS — E8770 Fluid overload, unspecified: Secondary | ICD-10-CM | POA: Diagnosis not present

## 2017-09-27 ENCOUNTER — Encounter: Payer: Self-pay | Admitting: Podiatry

## 2017-09-27 ENCOUNTER — Ambulatory Visit (INDEPENDENT_AMBULATORY_CARE_PROVIDER_SITE_OTHER): Payer: Medicare Other | Admitting: Podiatry

## 2017-09-27 ENCOUNTER — Ambulatory Visit (INDEPENDENT_AMBULATORY_CARE_PROVIDER_SITE_OTHER): Payer: Medicare Other

## 2017-09-27 DIAGNOSIS — S99912A Unspecified injury of left ankle, initial encounter: Secondary | ICD-10-CM

## 2017-09-27 DIAGNOSIS — S91309A Unspecified open wound, unspecified foot, initial encounter: Secondary | ICD-10-CM

## 2017-09-27 DIAGNOSIS — S82892D Other fracture of left lower leg, subsequent encounter for closed fracture with routine healing: Secondary | ICD-10-CM | POA: Diagnosis not present

## 2017-09-27 NOTE — Patient Instructions (Signed)
Apply a small amount of betadine to the wounds daily followed by a dressing.  Monitor for any signs/symptoms of infection. Call the office immediately if any occur or go directly to the emergency room. Call with any questions/concerns.

## 2017-09-28 DIAGNOSIS — E8770 Fluid overload, unspecified: Secondary | ICD-10-CM | POA: Diagnosis not present

## 2017-09-28 DIAGNOSIS — E1122 Type 2 diabetes mellitus with diabetic chronic kidney disease: Secondary | ICD-10-CM | POA: Diagnosis not present

## 2017-09-28 DIAGNOSIS — E1129 Type 2 diabetes mellitus with other diabetic kidney complication: Secondary | ICD-10-CM | POA: Diagnosis not present

## 2017-09-28 DIAGNOSIS — N186 End stage renal disease: Secondary | ICD-10-CM | POA: Diagnosis not present

## 2017-09-28 DIAGNOSIS — N2581 Secondary hyperparathyroidism of renal origin: Secondary | ICD-10-CM | POA: Diagnosis not present

## 2017-09-30 DIAGNOSIS — E1122 Type 2 diabetes mellitus with diabetic chronic kidney disease: Secondary | ICD-10-CM | POA: Diagnosis not present

## 2017-09-30 DIAGNOSIS — E1129 Type 2 diabetes mellitus with other diabetic kidney complication: Secondary | ICD-10-CM | POA: Diagnosis not present

## 2017-09-30 DIAGNOSIS — N186 End stage renal disease: Secondary | ICD-10-CM | POA: Diagnosis not present

## 2017-09-30 DIAGNOSIS — I82492 Acute embolism and thrombosis of other specified deep vein of left lower extremity: Secondary | ICD-10-CM | POA: Diagnosis not present

## 2017-09-30 DIAGNOSIS — E8770 Fluid overload, unspecified: Secondary | ICD-10-CM | POA: Diagnosis not present

## 2017-09-30 DIAGNOSIS — N2581 Secondary hyperparathyroidism of renal origin: Secondary | ICD-10-CM | POA: Diagnosis not present
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
## 2017-10-02 DIAGNOSIS — N2581 Secondary hyperparathyroidism of renal origin: Secondary | ICD-10-CM | POA: Diagnosis not present

## 2017-10-02 DIAGNOSIS — E8770 Fluid overload, unspecified: Secondary | ICD-10-CM | POA: Diagnosis not present

## 2017-10-02 DIAGNOSIS — N186 End stage renal disease: Secondary | ICD-10-CM | POA: Diagnosis not present

## 2017-10-02 DIAGNOSIS — E1122 Type 2 diabetes mellitus with diabetic chronic kidney disease: Secondary | ICD-10-CM | POA: Diagnosis not present

## 2017-10-02 DIAGNOSIS — E1129 Type 2 diabetes mellitus with other diabetic kidney complication: Secondary | ICD-10-CM | POA: Diagnosis not present

## 2017-10-04 DIAGNOSIS — E1129 Type 2 diabetes mellitus with other diabetic kidney complication: Secondary | ICD-10-CM | POA: Diagnosis not present

## 2017-10-04 DIAGNOSIS — E1122 Type 2 diabetes mellitus with diabetic chronic kidney disease: Secondary | ICD-10-CM | POA: Diagnosis not present

## 2017-10-04 DIAGNOSIS — N2581 Secondary hyperparathyroidism of renal origin: Secondary | ICD-10-CM | POA: Diagnosis not present

## 2017-10-04 DIAGNOSIS — E8770 Fluid overload, unspecified: Secondary | ICD-10-CM | POA: Diagnosis not present

## 2017-10-04 DIAGNOSIS — N186 End stage renal disease: Secondary | ICD-10-CM | POA: Diagnosis not present

## 2017-10-07 DIAGNOSIS — E1129 Type 2 diabetes mellitus with other diabetic kidney complication: Secondary | ICD-10-CM | POA: Diagnosis not present

## 2017-10-07 DIAGNOSIS — E8770 Fluid overload, unspecified: Secondary | ICD-10-CM | POA: Diagnosis not present

## 2017-10-07 DIAGNOSIS — I82492 Acute embolism and thrombosis of other specified deep vein of left lower extremity: Secondary | ICD-10-CM | POA: Diagnosis not present

## 2017-10-07 DIAGNOSIS — N186 End stage renal disease: Secondary | ICD-10-CM | POA: Diagnosis not present

## 2017-10-07 DIAGNOSIS — E1122 Type 2 diabetes mellitus with diabetic chronic kidney disease: Secondary | ICD-10-CM | POA: Diagnosis not present

## 2017-10-07 DIAGNOSIS — M109 Gout, unspecified: Secondary | ICD-10-CM | POA: Diagnosis not present

## 2017-10-07 DIAGNOSIS — N2581 Secondary hyperparathyroidism of renal origin: Secondary | ICD-10-CM | POA: Diagnosis not present

## 2017-10-07 LAB — PROTIME-INR: INR: 1.8 — AB (ref <=1.1)

## 2017-10-09 ENCOUNTER — Other Ambulatory Visit: Payer: Self-pay | Admitting: Endocrinology

## 2017-10-09 DIAGNOSIS — E1129 Type 2 diabetes mellitus with other diabetic kidney complication: Secondary | ICD-10-CM | POA: Diagnosis not present

## 2017-10-09 DIAGNOSIS — E8770 Fluid overload, unspecified: Secondary | ICD-10-CM | POA: Diagnosis not present

## 2017-10-09 DIAGNOSIS — N2581 Secondary hyperparathyroidism of renal origin: Secondary | ICD-10-CM | POA: Diagnosis not present

## 2017-10-09 DIAGNOSIS — E1122 Type 2 diabetes mellitus with diabetic chronic kidney disease: Secondary | ICD-10-CM | POA: Diagnosis not present

## 2017-10-09 DIAGNOSIS — N186 End stage renal disease: Secondary | ICD-10-CM | POA: Diagnosis not present

## 2017-10-11 DIAGNOSIS — N186 End stage renal disease: Secondary | ICD-10-CM | POA: Diagnosis not present

## 2017-10-11 DIAGNOSIS — E1129 Type 2 diabetes mellitus with other diabetic kidney complication: Secondary | ICD-10-CM | POA: Diagnosis not present

## 2017-10-11 DIAGNOSIS — N2581 Secondary hyperparathyroidism of renal origin: Secondary | ICD-10-CM | POA: Diagnosis not present

## 2017-10-11 DIAGNOSIS — Z992 Dependence on renal dialysis: Secondary | ICD-10-CM | POA: Diagnosis not present

## 2017-10-11 DIAGNOSIS — E1122 Type 2 diabetes mellitus with diabetic chronic kidney disease: Secondary | ICD-10-CM | POA: Diagnosis not present

## 2017-10-11 DIAGNOSIS — E8770 Fluid overload, unspecified: Secondary | ICD-10-CM | POA: Diagnosis not present

## 2017-10-14 DIAGNOSIS — E1129 Type 2 diabetes mellitus with other diabetic kidney complication: Secondary | ICD-10-CM | POA: Diagnosis not present

## 2017-10-14 DIAGNOSIS — N186 End stage renal disease: Secondary | ICD-10-CM | POA: Diagnosis not present

## 2017-10-14 DIAGNOSIS — Z992 Dependence on renal dialysis: Secondary | ICD-10-CM | POA: Diagnosis not present

## 2017-10-14 DIAGNOSIS — E1122 Type 2 diabetes mellitus with diabetic chronic kidney disease: Secondary | ICD-10-CM | POA: Diagnosis not present

## 2017-10-14 DIAGNOSIS — N2581 Secondary hyperparathyroidism of renal origin: Secondary | ICD-10-CM | POA: Diagnosis not present

## 2017-10-14 DIAGNOSIS — I82492 Acute embolism and thrombosis of other specified deep vein of left lower extremity: Secondary | ICD-10-CM | POA: Diagnosis not present

## 2017-10-15 ENCOUNTER — Ambulatory Visit (INDEPENDENT_AMBULATORY_CARE_PROVIDER_SITE_OTHER): Payer: Medicare Other

## 2017-10-15 ENCOUNTER — Other Ambulatory Visit: Payer: Self-pay | Admitting: Podiatry

## 2017-10-15 ENCOUNTER — Ambulatory Visit (INDEPENDENT_AMBULATORY_CARE_PROVIDER_SITE_OTHER): Payer: Medicare Other | Admitting: General Practice

## 2017-10-15 ENCOUNTER — Ambulatory Visit (INDEPENDENT_AMBULATORY_CARE_PROVIDER_SITE_OTHER): Payer: Medicare Other | Admitting: Podiatry

## 2017-10-15 ENCOUNTER — Telehealth: Payer: Self-pay | Admitting: *Deleted

## 2017-10-15 ENCOUNTER — Encounter: Payer: Self-pay | Admitting: Podiatry

## 2017-10-15 DIAGNOSIS — S82892D Other fracture of left lower leg, subsequent encounter for closed fracture with routine healing: Secondary | ICD-10-CM

## 2017-10-15 DIAGNOSIS — Z7901 Long term (current) use of anticoagulants: Secondary | ICD-10-CM

## 2017-10-15 DIAGNOSIS — S91309A Unspecified open wound, unspecified foot, initial encounter: Secondary | ICD-10-CM | POA: Diagnosis not present

## 2017-10-15 DIAGNOSIS — R0989 Other specified symptoms and signs involving the circulatory and respiratory systems: Secondary | ICD-10-CM

## 2017-10-15 MED ORDER — SILVER SULFADIAZINE 1 % EX CREA: 1.0000 "application " | TOPICAL_CREAM | Freq: Every day | CUTANEOUS | 0 refills | Status: DC

## 2017-10-15 MED ORDER — CEPHALEXIN 500 MG PO CAPS: 500.0000 mg | ORAL_CAPSULE | Freq: Two times a day (BID) | ORAL | 2 refills | Status: DC

## 2017-10-15 NOTE — Telephone Encounter (Signed)
-----   Message from Trula Slade, DPM sent at 10/15/2017 12:38 PM EST ----- Can you please order arterial studies/arterial duplex due to ulcerations on the feet and decreased pulses. Thanks.

## 2017-10-15 NOTE — Patient Instructions (Addendum)
Pre visit review using our clinic review tool, if applicable. No additional management support is needed unless otherwise documented below in the visit note.   Continue to take 1 tablet daily except 1 1/2 tablets on Wednesdays/Saturday.  Re-check in 1 week. Instructions given to patient.  Dialysis Pt.  Patient has been called with dosing instructions.  Pt verbalizes understanding.

## 2017-10-15 NOTE — Progress Notes (Signed)
Medical treatment/procedure(s) were performed by non-physician practitioner and as supervising physician I was immediately available for consultation/collaboration. I agree with above. Khristian Seals A Sayeed Weatherall, MD  

## 2017-10-15 NOTE — Telephone Encounter (Signed)
Orders faxed to [facility]. 

## 2017-10-16 DIAGNOSIS — N2581 Secondary hyperparathyroidism of renal origin: Secondary | ICD-10-CM | POA: Diagnosis not present

## 2017-10-16 DIAGNOSIS — E1122 Type 2 diabetes mellitus with diabetic chronic kidney disease: Secondary | ICD-10-CM | POA: Diagnosis not present

## 2017-10-16 DIAGNOSIS — E1129 Type 2 diabetes mellitus with other diabetic kidney complication: Secondary | ICD-10-CM | POA: Diagnosis not present

## 2017-10-16 DIAGNOSIS — Z992 Dependence on renal dialysis: Secondary | ICD-10-CM | POA: Diagnosis not present

## 2017-10-16 DIAGNOSIS — N186 End stage renal disease: Secondary | ICD-10-CM | POA: Diagnosis not present

## 2017-10-18 NOTE — Progress Notes (Signed)
Subjective: Mr. Cunnington presents to the office today for follow-up evaluation of left ankle fracture as well as wound to the left foot after sustaining a burn to his feet.  He also has new concerns of a wound to the top of his left foot.  He states that he had a blister to this area to think it may have come from the boot but is unsure.  He states he is been trying to keep area clean is been starting to use the Silvadene to this area as well.  Otherwise she states the wounds to his toes on the left foot are doing well in the right foot is doing very well.  He has not been wearing the cam boot but has been trying to remain nonweightbearing as much as possible.  He has no other concerns today. Denies any systemic complaints such as fevers, chills, nausea, vomiting. No acute changes since last appointment, and no other complaints at this time.  Past Medical History:  Diagnosis Date  . Allergic rhinitis   . Anemia   . Aortic insufficiency    a. Echo 7/16:  Mod LVH, EF 50-55%, mild to mod AI, severe LAE, PASP 57 mmHg (LV ID end diastolic 15.1 mm);  b. Echo 2/17:  Mod LVH, EF 50-55%, mod AI, MAC, mild MR, mod LAE, mild to mod TR, PASP 63 mmHg  . Bilateral carpal tunnel syndrome   . CHF (congestive heart failure) ([place])   . Chronic kidney disease    Kidney Transplant 2009 09/26/15- transplanated kidney failing  . Chronic kidney disease (CKD), stage IV (severe) ([place])   . Chronic renal allograft nephropathy   . Claustrophobia   . Complication of anesthesia    "they said something at [facility] it was affected by sleep aqpnea."  . COPD (chronic obstructive pulmonary disease) ([place])   . Diabetes mellitus    Type II  . Diabetic peripheral neuropathy ([place])   . Diabetic retinopathy ([place])   . Diminished eyesight   . Discitis of lumbar region    resolved  . DVT (deep venous thrombosis) ([place]) 10/07/2015   LLE  . Dyslipidemia   . Endocarditis   . Endocarditis 11/28/14  . Gait disorder   . Gastroparesis     . GERD (gastroesophageal reflux disease)   . Gout   . Hearing difficulty   . Hyperlipidemia   . Hypertension   . Incomplete bladder emptying   . Leg pain 02/05/11   with walking  . Morbid obesity ([place])   . Nervousness(799.21)   . Obstructive sleep apnea    not using CPAP- lost 100lbs  . Osteomyelitis ([place])   . Posttraumatic stress disorder   . Renal transplant, status post 04/05/2014  . Rotator cuff disorder   . Secondary hyperparathyroidism ([place])   . Sepsis ([place])    Postive blood cultures -   . Shortness of breath    when lying flat  . Urethral stricture   . Vitamin D deficiency   . Weight gain     Past Surgical History:  Procedure Laterality Date  . ARTERIOVENOUS GRAFT PLACEMENT Bilateral "several"  . AV FISTULA PLACEMENT Bilateral 2015  . BASCILIC VEIN TRANSPOSITION Right 09/27/2015  . [place] Right 09/27/2015   Procedure: BASILIC VEIN TRANSPOSITION right;  Surgeon: Mal Misty, MD;  Location: [facility];  Service: Vascular;  Laterality: Right;  . CATARACT EXTRACTION W/ INTRAOCULAR LENS  IMPLANT, BILATERAL Bilateral   . CYSTOSCOPY W/ INTERNAL URETHROTOMY  09/2014  .  KIDNEY TRANSPLANT  2009     Current Outpatient Medications:  .  BD PEN NEEDLE NANO U/F 32G X 4 MM MISC, USE  TO INJECT FIVE TIMES DAILY AS DIRECTED, Disp: 450 each, Rfl: 1 .  calcium acetate (PHOSLO) 667 MG capsule, Take 3 capsules (2,001 mg total) by mouth 3 (three) times daily with meals., Disp: 270 capsule, Rfl: 0 .  cephALEXin (KEFLEX) 500 MG capsule, Take 1 capsule (500 mg total) by mouth 4 (four) times daily., Disp: 20 capsule, Rfl: 0 .  cephALEXin (KEFLEX) 500 MG capsule, Take 1 capsule (500 mg total) by mouth 2 (two) times daily., Disp: 30 capsule, Rfl: 2 .  clonazePAM (KLONOPIN) 1 MG tablet, , Disp: , Rfl:  .  cloNIDine (CATAPRES) 0.3 MG tablet, TAKE 1 TABLET THREE TIMES DAILY, Disp: 270 tablet, Rfl: 1 .  Febuxostat (ULORIC) 80 MG TABS, Take 1 tablet (80 mg total) by mouth  daily., Disp: 90 tablet, Rfl: 2 .  glucose blood (ACCU-CHEK GUIDE) test strip, Check blood sugar four times daily, Disp: 200 each, Rfl: 12 .  glucose blood (ACCU-CHEK SMARTVIEW) test strip, USE FOUR TIMES DAILY AS DIRECTED TO CHECK BLOOD SUGAR, Disp: 400 each, Rfl: 0 .  HUMULIN R U-500 KWIKPEN 500 UNIT/ML kwikpen, INJECT SUBCUTANEOUSLY 15 UNITS AT BREAKFAST, 25 UNITS AT LUNCH AND 20 UNITS BEFORE SUPPER , Disp: 6 mL, Rfl: 0 .  hydrocortisone cream 1 %, Apply to affected area 2 times daily, Disp: 15 g, Rfl: 0 .  Insulin Glargine (TOUJEO SOLOSTAR) 300 UNIT/ML SOPN, Inject 90 Units into the skin 2 (two) times daily., Disp: 27 mL, Rfl: 2 .  Insulin Pen Needle 32G X 8 MM MISC, Use as directed, Disp: 100 each, Rfl: 0 .  labetalol (NORMODYNE) 100 MG tablet, TAKE 1 TABLET THREE TIMES DAILY, Disp: 270 tablet, Rfl: 2 .  Lancets (ACCU-CHEK MULTICLIX) lancets, Check blood sugar four times daily, Disp: 200 each, Rfl: 12 .  metoprolol succinate (TOPROL-XL) 25 MG 24 hr tablet, Take 1 tablet (25 mg total) by mouth daily., Disp: 30 tablet, Rfl: 3 .  midodrine (PROAMATINE) 10 MG tablet, Take 10 mg by mouth as needed (low blood pressure). , Disp: , Rfl:  .  ofloxacin (FLOXIN) 0.3 % OTIC solution, Place 10 drops into the right ear daily. For a total of 7 days, Disp: 5 mL, Rfl: 0 .  omeprazole (PRILOSEC) 20 MG capsule, TAKE 1 CAPSULE EVERY DAY, Disp: 90 capsule, Rfl: 2 .  RENVELA 800 MG tablet, Take 2,400 mg by mouth 3 (three) times daily with meals. , Disp: , Rfl:  .  rosuvastatin (CRESTOR) 20 MG tablet, TAKE 1 TABLET AT BEDTIME, Disp: 90 tablet, Rfl: 2 .  silver sulfADIAZINE (SILVADENE) 1 % cream, Apply 1 application topically daily., Disp: 50 g, Rfl: 2 .  silver sulfADIAZINE (SILVADENE) 1 % cream, Apply 1 application topically daily., Disp: 50 g, Rfl: 0 .  traMADol (ULTRAM) 50 MG tablet, Take 1 tablet (50 mg total) by mouth every 8 (eight) hours as needed., Disp: 30 tablet, Rfl: 0 .  VELTASSA 8.4 g packet, Take 8.4 g  by mouth daily. , Disp: , Rfl:  .  warfarin (COUMADIN) 5 MG tablet, Take 1 (5 mg) tablet every day except for Wednesday & Saturday when you take 7.22m (1.5 tablets) OR As directed., Disp: 105 tablet, Rfl: 0  Allergies  Allergen Reactions  . Pork-Derived Products Anaphylaxis and Other (See Comments)    Fever also   . Ace Inhibitors Cough  .  Hydrocodone Other (See Comments)    Hallucinations    Social History   Socioeconomic History  . Marital status: Divorced    Spouse name: Not on file  . Number of children: 0  . Years of education: College   . Highest education level: Not on file  Social Needs  . Financial resource strain: Not on file  . Food insecurity - worry: Not on file  . Food insecurity - inability: Not on file  . Transportation needs - medical: Not on file  . Transportation needs - non-medical: Not on file  Occupational History    Employer: UNEMPLOYED  Tobacco Use  . Smoking status: Never Smoker  . Smokeless tobacco: Never Used  Substance and Sexual Activity  . Alcohol use: No    Alcohol/week: 0.0 oz  . Drug use: No  . Sexual activity: Not Currently  Other Topics Concern  . Not on file  Social History Narrative   Patient is Divorced.   Patient does not have any children.   Patient is right-handed   Patient is currently in college working on his Ph.D.          Objective: AAO x3, NAD DP/PT pulses palpable bilaterally, CRT less than 3 seconds There is still some mild edema to the left ankle but this appears to be improved.  There is no significant tenderness palpation of the ankle.  To the dorsal aspect of the left foot there is a sharp measuring approximately 3 x 2.5 cm to the dorsal foot without any areas of fluctuance or crepitus.  There is no malodor.  There is no surrounding erythema, ascending cellulitis On the corresponding aspect of the second third toes on the interspace with superficial wounds present these appear to be healing.  There is no  drainage or pus no swelling or redness.  No fluctuance or crepitus.  No malodor. Wound on the right side appear to be healed No other open lesions or pre-ulcerative lesions No pain with calf compression, swelling, warmth, erythema        Assessment: Left ankle fracture, healing wounds left 2nd and ulceration dorsal foot 3rd digit; new   Plan: -All treatment options discussed with the patient including all alternatives, risks, complications.  -X-rays were obtained and reviewed. SER 4 ankle fracture noted with minimal displacement.  Vessel calcification is also evident.  There does appear to be some evidence of healing along the fracture site but is still evident. -He has a new large ulceration dorsal left midfoot.  Continue Silvadene to this area daily.  I am going to start antibiotics given the look of the wound.  Prescribed Keflex. -Continuous dressing changes to the digits daily overall these are improved. -I ordered vascular studies for him.  I cannot of this previously because he has been healing appropriately but given the look of the wound of the left foot I want to get this checked. -Regards with ankle fracture recommend immobilization but he declines this.  He is in a scooter.  I do want to wear the boot if he is going to put any weight to his foot.  He needs to make sure that the wound is padded and the foot is padded with wearing the boot. -Follow-up in 2 weeks or sooner if needed -Monitor for any clinical signs or symptoms of infection and directed to call the office immediately should any occur or go to the ER.  Trula Slade DPM

## 2017-10-19 DIAGNOSIS — E1122 Type 2 diabetes mellitus with diabetic chronic kidney disease: Secondary | ICD-10-CM | POA: Diagnosis not present

## 2017-10-19 DIAGNOSIS — E1129 Type 2 diabetes mellitus with other diabetic kidney complication: Secondary | ICD-10-CM | POA: Diagnosis not present

## 2017-10-19 DIAGNOSIS — N2581 Secondary hyperparathyroidism of renal origin: Secondary | ICD-10-CM | POA: Diagnosis not present

## 2017-10-19 DIAGNOSIS — N186 End stage renal disease: Secondary | ICD-10-CM | POA: Diagnosis not present

## 2017-10-19 DIAGNOSIS — Z992 Dependence on renal dialysis: Secondary | ICD-10-CM | POA: Diagnosis not present

## 2017-10-20 ENCOUNTER — Other Ambulatory Visit (INDEPENDENT_AMBULATORY_CARE_PROVIDER_SITE_OTHER): Payer: Medicare Other

## 2017-10-20 DIAGNOSIS — R200 Anesthesia of skin: Secondary | ICD-10-CM | POA: Diagnosis not present

## 2017-10-20 DIAGNOSIS — Z794 Long term (current) use of insulin: Secondary | ICD-10-CM

## 2017-10-20 DIAGNOSIS — E1165 Type 2 diabetes mellitus with hyperglycemia: Secondary | ICD-10-CM | POA: Diagnosis not present

## 2017-10-20 LAB — GLUCOSE, RANDOM: Glucose, Bld: 133 mg/dL — ABNORMAL HIGH (ref 70–99)

## 2017-10-20 LAB — HEMOGLOBIN A1C: Hgb A1c MFr Bld: 7.9 % — ABNORMAL HIGH (ref 4.6–6.5)

## 2017-10-21 DIAGNOSIS — N186 End stage renal disease: Secondary | ICD-10-CM | POA: Diagnosis not present

## 2017-10-21 DIAGNOSIS — Z992 Dependence on renal dialysis: Secondary | ICD-10-CM | POA: Diagnosis not present

## 2017-10-21 DIAGNOSIS — N2581 Secondary hyperparathyroidism of renal origin: Secondary | ICD-10-CM | POA: Diagnosis not present

## 2017-10-21 DIAGNOSIS — E1129 Type 2 diabetes mellitus with other diabetic kidney complication: Secondary | ICD-10-CM | POA: Diagnosis not present

## 2017-10-21 DIAGNOSIS — E1122 Type 2 diabetes mellitus with diabetic chronic kidney disease: Secondary | ICD-10-CM | POA: Diagnosis not present

## 2017-10-22 ENCOUNTER — Ambulatory Visit (HOSPITAL_COMMUNITY)
Admission: RE | Admit: 2017-10-22 | Discharge: 2017-10-22 | Disposition: A | Discharge status: Home / Self Care | Payer: Medicare Other | Source: Ambulatory Visit | Attending: Cardiovascular Disease | Admitting: Cardiovascular Disease

## 2017-10-22 DIAGNOSIS — R0989 Other specified symptoms and signs involving the circulatory and respiratory systems: Secondary | ICD-10-CM | POA: Insufficient documentation

## 2017-10-22 DIAGNOSIS — I70203 Unspecified atherosclerosis of native arteries of extremities, bilateral legs: Secondary | ICD-10-CM | POA: Insufficient documentation

## 2017-10-23 DIAGNOSIS — E1129 Type 2 diabetes mellitus with other diabetic kidney complication: Secondary | ICD-10-CM | POA: Diagnosis not present

## 2017-10-23 DIAGNOSIS — N186 End stage renal disease: Secondary | ICD-10-CM | POA: Diagnosis not present

## 2017-10-23 DIAGNOSIS — N2581 Secondary hyperparathyroidism of renal origin: Secondary | ICD-10-CM | POA: Diagnosis not present

## 2017-10-23 DIAGNOSIS — E1122 Type 2 diabetes mellitus with diabetic chronic kidney disease: Secondary | ICD-10-CM | POA: Diagnosis not present

## 2017-10-23 DIAGNOSIS — Z992 Dependence on renal dialysis: Secondary | ICD-10-CM | POA: Diagnosis not present

## 2017-10-25 ENCOUNTER — Ambulatory Visit: Payer: Self-pay | Admitting: Skilled Nursing Facility1

## 2017-10-25 ENCOUNTER — Other Ambulatory Visit: Payer: Self-pay | Admitting: Podiatry

## 2017-10-26 DIAGNOSIS — E1122 Type 2 diabetes mellitus with diabetic chronic kidney disease: Secondary | ICD-10-CM | POA: Diagnosis not present

## 2017-10-26 DIAGNOSIS — Z992 Dependence on renal dialysis: Secondary | ICD-10-CM | POA: Diagnosis not present

## 2017-10-26 DIAGNOSIS — N2581 Secondary hyperparathyroidism of renal origin: Secondary | ICD-10-CM | POA: Diagnosis not present

## 2017-10-26 DIAGNOSIS — N186 End stage renal disease: Secondary | ICD-10-CM | POA: Diagnosis not present

## 2017-10-26 DIAGNOSIS — E1129 Type 2 diabetes mellitus with other diabetic kidney complication: Secondary | ICD-10-CM | POA: Diagnosis not present

## 2017-10-27 ENCOUNTER — Ambulatory Visit: Payer: Self-pay | Admitting: Skilled Nursing Facility1

## 2017-10-27 ENCOUNTER — Ambulatory Visit: Payer: Self-pay | Admitting: Endocrinology

## 2017-10-27 DIAGNOSIS — G6289 Other specified polyneuropathies: Secondary | ICD-10-CM | POA: Diagnosis not present

## 2017-10-27 DIAGNOSIS — G5623 Lesion of ulnar nerve, bilateral upper limbs: Secondary | ICD-10-CM | POA: Diagnosis not present

## 2017-10-27 DIAGNOSIS — G5603 Carpal tunnel syndrome, bilateral upper limbs: Secondary | ICD-10-CM | POA: Diagnosis not present

## 2017-10-28 DIAGNOSIS — E1122 Type 2 diabetes mellitus with diabetic chronic kidney disease: Secondary | ICD-10-CM | POA: Diagnosis not present

## 2017-10-28 DIAGNOSIS — Z992 Dependence on renal dialysis: Secondary | ICD-10-CM | POA: Diagnosis not present

## 2017-10-28 DIAGNOSIS — N186 End stage renal disease: Secondary | ICD-10-CM | POA: Diagnosis not present

## 2017-10-28 DIAGNOSIS — I82492 Acute embolism and thrombosis of other specified deep vein of left lower extremity: Secondary | ICD-10-CM | POA: Diagnosis not present

## 2017-10-28 DIAGNOSIS — E1129 Type 2 diabetes mellitus with other diabetic kidney complication: Secondary | ICD-10-CM | POA: Diagnosis not present

## 2017-10-28 DIAGNOSIS — N2581 Secondary hyperparathyroidism of renal origin: Secondary | ICD-10-CM | POA: Diagnosis not present

## 2017-10-29 ENCOUNTER — Ambulatory Visit (INDEPENDENT_AMBULATORY_CARE_PROVIDER_SITE_OTHER): Payer: Medicare Other | Admitting: Podiatry

## 2017-10-29 ENCOUNTER — Telehealth: Payer: Self-pay | Admitting: *Deleted

## 2017-10-29 ENCOUNTER — Encounter: Payer: Self-pay | Admitting: Podiatry

## 2017-10-29 DIAGNOSIS — R0989 Other specified symptoms and signs involving the circulatory and respiratory systems: Secondary | ICD-10-CM

## 2017-10-29 DIAGNOSIS — G5621 Lesion of ulnar nerve, right upper limb: Secondary | ICD-10-CM | POA: Diagnosis not present

## 2017-10-29 DIAGNOSIS — S82892D Other fracture of left lower leg, subsequent encounter for closed fracture with routine healing: Secondary | ICD-10-CM | POA: Diagnosis not present

## 2017-10-29 DIAGNOSIS — I739 Peripheral vascular disease, unspecified: Secondary | ICD-10-CM

## 2017-10-29 DIAGNOSIS — S91309A Unspecified open wound, unspecified foot, initial encounter: Secondary | ICD-10-CM | POA: Diagnosis not present

## 2017-10-29 NOTE — Telephone Encounter (Signed)
-----   Message from Trula Slade, DPM sent at 10/27/2017  5:39 PM EST ----- Abnormal with ulcer on left foot. Can you please put in a referral for vascular or Dr. Gwenlyn Found? Thanks.

## 2017-10-29 NOTE — Telephone Encounter (Signed)
I informed pt of Dr. Leigh Aurora review of results and referral to [facility] and that if he had not heard from them by Tuesday to call [phone]. Pt states understanding.

## 2017-10-29 NOTE — Telephone Encounter (Signed)
-----   Message from Trula Slade, DPM sent at 10/27/2017  5:38 PM EST ----- abnormal- can you please put in a vascular consult

## 2017-10-29 NOTE — Telephone Encounter (Signed)
Faxed referral to [facility].

## 2017-10-30 DIAGNOSIS — N186 End stage renal disease: Secondary | ICD-10-CM | POA: Diagnosis not present

## 2017-10-30 DIAGNOSIS — Z992 Dependence on renal dialysis: Secondary | ICD-10-CM | POA: Diagnosis not present

## 2017-10-30 DIAGNOSIS — E1129 Type 2 diabetes mellitus with other diabetic kidney complication: Secondary | ICD-10-CM | POA: Diagnosis not present

## 2017-10-30 DIAGNOSIS — N2581 Secondary hyperparathyroidism of renal origin: Secondary | ICD-10-CM | POA: Diagnosis not present

## 2017-10-30 DIAGNOSIS — E1122 Type 2 diabetes mellitus with diabetic chronic kidney disease: Secondary | ICD-10-CM | POA: Diagnosis not present

## 2017-11-01 NOTE — Progress Notes (Signed)
Subjective: [name] presents to the office today for follow-up evaluation of left ankle fracture as well as wound to the left foot after sustaining a burn to his feet for an ulceration on the top of the left foot.  He has been using Silvadene to the wound daily.  He denies any drainage or pus and he feels that the wounds are getting better.  He has not had any significant swelling increase to the left ankle or foot he denies any redness or warmth and he denies any drainage or pus coming from the wounds.  He has no other concerns today. Denies any systemic complaints such as fevers, chills, nausea, vomiting. No acute changes since last appointment, and no other complaints at this time.  Past Medical History:  Diagnosis Date  . Allergic rhinitis   . Anemia   . Aortic insufficiency    a. Echo 7/16:  Mod LVH, EF 50-55%, mild to mod AI, severe LAE, PASP 57 mmHg (LV ID end diastolic 08.1 mm);  b. Echo 2/17:  Mod LVH, EF 50-55%, mod AI, MAC, mild MR, mod LAE, mild to mod TR, PASP 63 mmHg  . Bilateral carpal tunnel syndrome   . CHF (congestive heart failure) ([place])   . Chronic kidney disease    Kidney Transplant 2009 09/26/15- transplanated kidney failing  . Chronic kidney disease (CKD), stage IV (severe) ([place])   . Chronic renal allograft nephropathy   . Claustrophobia   . Complication of anesthesia    "they said something at [facility] it was affected by sleep aqpnea."  . COPD (chronic obstructive pulmonary disease) ([place])   . Diabetes mellitus    Type II  . Diabetic peripheral neuropathy ([place])   . Diabetic retinopathy ([place])   . Diminished eyesight   . Discitis of lumbar region    resolved  . DVT (deep venous thrombosis) ([place]) 10/07/2015   LLE  . Dyslipidemia   . Endocarditis   . Endocarditis 11/28/14  . Gait disorder   . Gastroparesis   . GERD (gastroesophageal reflux disease)   . Gout   . Hearing difficulty   . Hyperlipidemia   . Hypertension   . Incomplete bladder emptying   .  Leg pain 02/05/11   with walking  . Morbid obesity ([place])   . Nervousness(799.21)   . Obstructive sleep apnea    not using CPAP- lost 100lbs  . Osteomyelitis ([place])   . Posttraumatic stress disorder   . Renal transplant, status post 04/05/2014  . Rotator cuff disorder   . Secondary hyperparathyroidism ([place])   . Sepsis ([place])    Postive blood cultures -   . Shortness of breath    when lying flat  . Urethral stricture   . Vitamin D deficiency   . Weight gain     Past Surgical History:  Procedure Laterality Date  . ARTERIOVENOUS GRAFT PLACEMENT Bilateral "several"  . AV FISTULA PLACEMENT Bilateral 2015  . BASCILIC VEIN TRANSPOSITION Right 09/27/2015  . [place] Right 09/27/2015   Procedure: BASILIC VEIN TRANSPOSITION right;  Surgeon: Mal Misty, MD;  Location: [facility];  Service: Vascular;  Laterality: Right;  . CATARACT EXTRACTION W/ INTRAOCULAR LENS  IMPLANT, BILATERAL Bilateral   . CYSTOSCOPY W/ INTERNAL URETHROTOMY  09/2014  . KIDNEY TRANSPLANT  2009     Current Outpatient Medications:  .  BD PEN NEEDLE NANO U/F 32G X 4 MM MISC, USE  TO INJECT FIVE TIMES DAILY AS DIRECTED, Disp: 450 each, Rfl: 1 .  calcium acetate (PHOSLO) 667 MG capsule, Take 3 capsules (2,001 mg total) by mouth 3 (three) times daily with meals., Disp: 270 capsule, Rfl: 0 .  cephALEXin (KEFLEX) 500 MG capsule, Take 1 capsule (500 mg total) by mouth 4 (four) times daily., Disp: 20 capsule, Rfl: 0 .  cephALEXin (KEFLEX) 500 MG capsule, Take 1 capsule (500 mg total) by mouth 2 (two) times daily., Disp: 30 capsule, Rfl: 2 .  clonazePAM (KLONOPIN) 1 MG tablet, , Disp: , Rfl:  .  cloNIDine (CATAPRES) 0.3 MG tablet, TAKE 1 TABLET THREE TIMES DAILY, Disp: 270 tablet, Rfl: 1 .  Febuxostat (ULORIC) 80 MG TABS, Take 1 tablet (80 mg total) by mouth daily., Disp: 90 tablet, Rfl: 2 .  glucose blood (ACCU-CHEK GUIDE) test strip, Check blood sugar four times daily, Disp: 200 each, Rfl: 12 .  glucose blood  (ACCU-CHEK SMARTVIEW) test strip, USE FOUR TIMES DAILY AS DIRECTED TO CHECK BLOOD SUGAR, Disp: 400 each, Rfl: 0 .  HUMULIN R U-500 KWIKPEN 500 UNIT/ML kwikpen, INJECT SUBCUTANEOUSLY 15 UNITS AT BREAKFAST, 25 UNITS AT LUNCH AND 20 UNITS BEFORE SUPPER , Disp: 6 mL, Rfl: 0 .  hydrocortisone cream 1 %, Apply to affected area 2 times daily, Disp: 15 g, Rfl: 0 .  Insulin Glargine (TOUJEO SOLOSTAR) 300 UNIT/ML SOPN, Inject 90 Units into the skin 2 (two) times daily., Disp: 27 mL, Rfl: 2 .  Insulin Pen Needle 32G X 8 MM MISC, Use as directed, Disp: 100 each, Rfl: 0 .  labetalol (NORMODYNE) 100 MG tablet, TAKE 1 TABLET THREE TIMES DAILY, Disp: 270 tablet, Rfl: 2 .  Lancets (ACCU-CHEK MULTICLIX) lancets, Check blood sugar four times daily, Disp: 200 each, Rfl: 12 .  metoprolol succinate (TOPROL-XL) 25 MG 24 hr tablet, Take 1 tablet (25 mg total) by mouth daily., Disp: 30 tablet, Rfl: 3 .  midodrine (PROAMATINE) 10 MG tablet, Take 10 mg by mouth as needed (low blood pressure). , Disp: , Rfl:  .  ofloxacin (FLOXIN) 0.3 % OTIC solution, Place 10 drops into the right ear daily. For a total of 7 days, Disp: 5 mL, Rfl: 0 .  omeprazole (PRILOSEC) 20 MG capsule, TAKE 1 CAPSULE EVERY DAY, Disp: 90 capsule, Rfl: 2 .  RENVELA 800 MG tablet, Take 2,400 mg by mouth 3 (three) times daily with meals. , Disp: , Rfl:  .  rosuvastatin (CRESTOR) 20 MG tablet, TAKE 1 TABLET AT BEDTIME, Disp: 90 tablet, Rfl: 2 .  silver sulfADIAZINE (SILVADENE) 1 % cream, Apply 1 application topically daily., Disp: 50 g, Rfl: 2 .  silver sulfADIAZINE (SILVADENE) 1 % cream, Apply 1 application topically daily., Disp: 50 g, Rfl: 0 .  traMADol (ULTRAM) 50 MG tablet, Take 1 tablet (50 mg total) by mouth every 8 (eight) hours as needed., Disp: 30 tablet, Rfl: 0 .  VELTASSA 8.4 g packet, Take 8.4 g by mouth daily. , Disp: , Rfl:  .  warfarin (COUMADIN) 5 MG tablet, Take 1 (5 mg) tablet every day except for Wednesday & Saturday when you take 7.33m (1.5  tablets) OR As directed., Disp: 105 tablet, Rfl: 0  Allergies  Allergen Reactions  . Pork-Derived Products Anaphylaxis and Other (See Comments)    Fever also   . Ace Inhibitors Cough  . Hydrocodone Other (See Comments)    Hallucinations    Social History   Socioeconomic History  . Marital status: Divorced    Spouse name: Not on file  . Number of children: 0  . Years  of education: College   . Highest education level: Not on file  Social Needs  . Financial resource strain: Not on file  . Food insecurity - worry: Not on file  . Food insecurity - inability: Not on file  . Transportation needs - medical: Not on file  . Transportation needs - non-medical: Not on file  Occupational History    Employer: UNEMPLOYED  Tobacco Use  . Smoking status: Never Smoker  . Smokeless tobacco: Never Used  Substance and Sexual Activity  . Alcohol use: No    Alcohol/week: 0.0 oz  . Drug use: No  . Sexual activity: Not Currently  Other Topics Concern  . Not on file  Social History Narrative   Patient is Divorced.   Patient does not have any children.   Patient is right-handed   Patient is currently in college working on his Ph.D.       Objective: AAO x3, NAD DP/PT pulses decreased bilaterally To the dorsal aspect the left foot on the [place] this appears to be improving.  There is no surrounding erythema today and there is no ascending cellulitis.  There is no fluctuation or crepitation.  There is no malodor. On the corresponding aspect of the second third toes on the interspace with superficial wounds present however they appear to be granular today there is no probing, undermining or tunneling. There is no drainage or pus no swelling or redness.  No fluctuance or crepitus.  No malodor. There is a new area of what appears to be a blister to the plantar aspect of the left foot.  No surrounding erythema or signs of infection. No wound to the contralateral extremity Mild swelling to the left  ankle but there is no tenderness palpation.  Ankle joint range of motion intact. No other open lesions or pre-ulcerative lesions No pain with calf compression, swelling, warmth, erythema          Assessment: Left ankle fracture, ulcerations left second third toe as well as dorsal foot; new blister formation plantar foot  Plan: -All treatment options discussed with the patient including all alternatives, risks, complications.  -There is course of antibiotics.  Does not appear to be clinically infected today so we will hold off any further oral antibiotics at this point once he finishes his course now.  Continue with Silvadene dressing changes daily. -Reviewed vascular studies again with the patient.  I have placed a referral to further assess arterial flow. -Recommend immobilization of the left ankle however due to the swelling I think this is causing pressure in the boot.  If he ambulates she can wear the boot but he typically does not do much walking.  Recommend elevation. -Follow-up in 1 week or sooner if needed -Monitor for any clinical signs or symptoms of infection and directed to call the office immediately should any occur or go to the ER.  *X-ray left foot and ankle next appointment  Trula Slade DPM

## 2017-11-02 DIAGNOSIS — N186 End stage renal disease: Secondary | ICD-10-CM | POA: Diagnosis not present

## 2017-11-02 DIAGNOSIS — E1122 Type 2 diabetes mellitus with diabetic chronic kidney disease: Secondary | ICD-10-CM | POA: Diagnosis not present

## 2017-11-02 DIAGNOSIS — N2581 Secondary hyperparathyroidism of renal origin: Secondary | ICD-10-CM | POA: Diagnosis not present

## 2017-11-02 DIAGNOSIS — Z992 Dependence on renal dialysis: Secondary | ICD-10-CM | POA: Diagnosis not present

## 2017-11-02 DIAGNOSIS — E1129 Type 2 diabetes mellitus with other diabetic kidney complication: Secondary | ICD-10-CM | POA: Diagnosis not present

## 2017-11-04 DIAGNOSIS — Z992 Dependence on renal dialysis: Secondary | ICD-10-CM | POA: Diagnosis not present

## 2017-11-04 DIAGNOSIS — I82492 Acute embolism and thrombosis of other specified deep vein of left lower extremity: Secondary | ICD-10-CM | POA: Diagnosis not present

## 2017-11-04 DIAGNOSIS — M109 Gout, unspecified: Secondary | ICD-10-CM | POA: Diagnosis not present

## 2017-11-04 DIAGNOSIS — N2581 Secondary hyperparathyroidism of renal origin: Secondary | ICD-10-CM | POA: Diagnosis not present

## 2017-11-04 DIAGNOSIS — N186 End stage renal disease: Secondary | ICD-10-CM | POA: Diagnosis not present

## 2017-11-04 DIAGNOSIS — E1122 Type 2 diabetes mellitus with diabetic chronic kidney disease: Secondary | ICD-10-CM | POA: Diagnosis not present

## 2017-11-04 DIAGNOSIS — E1129 Type 2 diabetes mellitus with other diabetic kidney complication: Secondary | ICD-10-CM | POA: Diagnosis not present

## 2017-11-05 ENCOUNTER — Ambulatory Visit (INDEPENDENT_AMBULATORY_CARE_PROVIDER_SITE_OTHER): Payer: Medicare Other | Admitting: Podiatry

## 2017-11-05 ENCOUNTER — Ambulatory Visit (INDEPENDENT_AMBULATORY_CARE_PROVIDER_SITE_OTHER): Payer: Medicare Other

## 2017-11-05 DIAGNOSIS — I739 Peripheral vascular disease, unspecified: Secondary | ICD-10-CM | POA: Diagnosis not present

## 2017-11-05 DIAGNOSIS — S82892D Other fracture of left lower leg, subsequent encounter for closed fracture with routine healing: Secondary | ICD-10-CM | POA: Diagnosis not present

## 2017-11-05 DIAGNOSIS — S91309A Unspecified open wound, unspecified foot, initial encounter: Secondary | ICD-10-CM | POA: Diagnosis not present

## 2017-11-05 LAB — POCT INR: INR: 2.5

## 2017-11-06 DIAGNOSIS — E1129 Type 2 diabetes mellitus with other diabetic kidney complication: Secondary | ICD-10-CM | POA: Diagnosis not present

## 2017-11-06 DIAGNOSIS — Z992 Dependence on renal dialysis: Secondary | ICD-10-CM | POA: Diagnosis not present

## 2017-11-06 DIAGNOSIS — N2581 Secondary hyperparathyroidism of renal origin: Secondary | ICD-10-CM | POA: Diagnosis not present

## 2017-11-06 DIAGNOSIS — N186 End stage renal disease: Secondary | ICD-10-CM | POA: Diagnosis not present

## 2017-11-06 DIAGNOSIS — E1122 Type 2 diabetes mellitus with diabetic chronic kidney disease: Secondary | ICD-10-CM | POA: Diagnosis not present

## 2017-11-08 NOTE — Progress Notes (Signed)
Subjective: [name] [name] presents to the office today for follow-up evaluation of left ankle fracture as well as for slow healing wounds on the left foot after he burned his foot soaking his foot in hot tub of water.  He also developed a wound on the top of his foot he broke his ankle.  He states he is to continue with Silvadene dressing changes daily.  Denies any drainage or pus.  He has not been wearing the cam boot as he is concerned that it rubs on the wound.  He has done minimal walking.  He has no other concerns today.  Denies any systemic complaints such as fevers, chills, nausea, vomiting. No acute changes since last appointment, and no other complaints at this time.  Past Medical History:  Diagnosis Date  . Allergic rhinitis   . Anemia   . Aortic insufficiency    a. Echo 7/16:  Mod LVH, EF 50-55%, mild to mod AI, severe LAE, PASP 57 mmHg (LV ID end diastolic 04.8 mm);  b. Echo 2/17:  Mod LVH, EF 50-55%, mod AI, MAC, mild MR, mod LAE, mild to mod TR, PASP 63 mmHg  . Bilateral carpal tunnel syndrome   . CHF (congestive heart failure) ([place])   . Chronic kidney disease    Kidney Transplant 2009 09/26/15- transplanated kidney failing  . Chronic kidney disease (CKD), stage IV (severe) ([place])   . Chronic renal allograft nephropathy   . Claustrophobia   . Complication of anesthesia    "they said something at [facility] it was affected by sleep aqpnea."  . COPD (chronic obstructive pulmonary disease) ([place])   . Diabetes mellitus    Type II  . Diabetic peripheral neuropathy ([place])   . Diabetic retinopathy ([place])   . Diminished eyesight   . Discitis of lumbar region    resolved  . DVT (deep venous thrombosis) ([place]) 10/07/2015   LLE  . Dyslipidemia   . Endocarditis   . Endocarditis 11/28/14  . Gait disorder   . Gastroparesis   . GERD (gastroesophageal reflux disease)   . Gout   . Hearing difficulty   . Hyperlipidemia   . Hypertension   . Incomplete bladder emptying   . Leg pain 02/05/11     with walking  . Morbid obesity ([place])   . Nervousness(799.21)   . Obstructive sleep apnea    not using CPAP- lost 100lbs  . Osteomyelitis ([place])   . Posttraumatic stress disorder   . Renal transplant, status post 04/05/2014  . Rotator cuff disorder   . Secondary hyperparathyroidism ([place])   . Sepsis ([place])    Postive blood cultures -   . Shortness of breath    when lying flat  . Urethral stricture   . Vitamin D deficiency   . Weight gain     Past Surgical History:  Procedure Laterality Date  . ARTERIOVENOUS GRAFT PLACEMENT Bilateral "several"  . AV FISTULA PLACEMENT Bilateral 2015  . BASCILIC VEIN TRANSPOSITION Right 09/27/2015  . [place] Right 09/27/2015   Procedure: BASILIC VEIN TRANSPOSITION right;  Surgeon: Mal Misty, MD;  Location: [facility];  Service: Vascular;  Laterality: Right;  . CATARACT EXTRACTION W/ INTRAOCULAR LENS  IMPLANT, BILATERAL Bilateral   . CYSTOSCOPY W/ INTERNAL URETHROTOMY  09/2014  . KIDNEY TRANSPLANT  2009     Current Outpatient Medications:  .  BD PEN NEEDLE NANO U/F 32G X 4 MM MISC, USE  TO INJECT FIVE TIMES DAILY AS DIRECTED, Disp: 450 each,  Rfl: 1 .  calcium acetate (PHOSLO) 667 MG capsule, Take 3 capsules (2,001 mg total) by mouth 3 (three) times daily with meals., Disp: 270 capsule, Rfl: 0 .  cephALEXin (KEFLEX) 500 MG capsule, Take 1 capsule (500 mg total) by mouth 4 (four) times daily., Disp: 20 capsule, Rfl: 0 .  cephALEXin (KEFLEX) 500 MG capsule, Take 1 capsule (500 mg total) by mouth 2 (two) times daily., Disp: 30 capsule, Rfl: 2 .  clonazePAM (KLONOPIN) 1 MG tablet, , Disp: , Rfl:  .  cloNIDine (CATAPRES) 0.3 MG tablet, TAKE 1 TABLET THREE TIMES DAILY, Disp: 270 tablet, Rfl: 1 .  Febuxostat (ULORIC) 80 MG TABS, Take 1 tablet (80 mg total) by mouth daily., Disp: 90 tablet, Rfl: 2 .  glucose blood (ACCU-CHEK GUIDE) test strip, Check blood sugar four times daily, Disp: 200 each, Rfl: 12 .  glucose blood (ACCU-CHEK  SMARTVIEW) test strip, USE FOUR TIMES DAILY AS DIRECTED TO CHECK BLOOD SUGAR, Disp: 400 each, Rfl: 0 .  HUMULIN R U-500 KWIKPEN 500 UNIT/ML kwikpen, INJECT SUBCUTANEOUSLY 15 UNITS AT BREAKFAST, 25 UNITS AT LUNCH AND 20 UNITS BEFORE SUPPER , Disp: 6 mL, Rfl: 0 .  hydrocortisone cream 1 %, Apply to affected area 2 times daily, Disp: 15 g, Rfl: 0 .  Insulin Glargine (TOUJEO SOLOSTAR) 300 UNIT/ML SOPN, Inject 90 Units into the skin 2 (two) times daily., Disp: 27 mL, Rfl: 2 .  Insulin Pen Needle 32G X 8 MM MISC, Use as directed, Disp: 100 each, Rfl: 0 .  labetalol (NORMODYNE) 100 MG tablet, TAKE 1 TABLET THREE TIMES DAILY, Disp: 270 tablet, Rfl: 2 .  Lancets (ACCU-CHEK MULTICLIX) lancets, Check blood sugar four times daily, Disp: 200 each, Rfl: 12 .  metoprolol succinate (TOPROL-XL) 25 MG 24 hr tablet, Take 1 tablet (25 mg total) by mouth daily., Disp: 30 tablet, Rfl: 3 .  midodrine (PROAMATINE) 10 MG tablet, Take 10 mg by mouth as needed (low blood pressure). , Disp: , Rfl:  .  ofloxacin (FLOXIN) 0.3 % OTIC solution, Place 10 drops into the right ear daily. For a total of 7 days, Disp: 5 mL, Rfl: 0 .  omeprazole (PRILOSEC) 20 MG capsule, TAKE 1 CAPSULE EVERY DAY, Disp: 90 capsule, Rfl: 2 .  RENVELA 800 MG tablet, Take 2,400 mg by mouth 3 (three) times daily with meals. , Disp: , Rfl:  .  rosuvastatin (CRESTOR) 20 MG tablet, TAKE 1 TABLET AT BEDTIME, Disp: 90 tablet, Rfl: 2 .  silver sulfADIAZINE (SILVADENE) 1 % cream, Apply 1 application topically daily., Disp: 50 g, Rfl: 2 .  silver sulfADIAZINE (SILVADENE) 1 % cream, Apply 1 application topically daily., Disp: 50 g, Rfl: 0 .  traMADol (ULTRAM) 50 MG tablet, Take 1 tablet (50 mg total) by mouth every 8 (eight) hours as needed., Disp: 30 tablet, Rfl: 0 .  VELTASSA 8.4 g packet, Take 8.4 g by mouth daily. , Disp: , Rfl:  .  warfarin (COUMADIN) 5 MG tablet, Take 1 (5 mg) tablet every day except for Wednesday & Saturday when you take 7.13m (1.5 tablets) OR  As directed., Disp: 105 tablet, Rfl: 0  Allergies  Allergen Reactions  . Pork-Derived Products Anaphylaxis and Other (See Comments)    Fever also   . Ace Inhibitors Cough  . Hydrocodone Other (See Comments)    Hallucinations    Social History   Socioeconomic History  . Marital status: Divorced    Spouse name: Not on file  . Number of children:  0  . Years of education: College   . Highest education level: Not on file  Social Needs  . Financial resource strain: Not on file  . Food insecurity - worry: Not on file  . Food insecurity - inability: Not on file  . Transportation needs - medical: Not on file  . Transportation needs - non-medical: Not on file  Occupational History    Employer: UNEMPLOYED  Tobacco Use  . Smoking status: Never Smoker  . Smokeless tobacco: Never Used  Substance and Sexual Activity  . Alcohol use: No    Alcohol/week: 0.0 oz  . Drug use: No  . Sexual activity: Not Currently  Other Topics Concern  . Not on file  Social History Narrative   Patient is Divorced.   Patient does not have any children.   Patient is right-handed   Patient is currently in college working on his Ph.D.       Objective: AAO x3, NAD DP/PT pulses decreased bilaterally To the dorsal aspect the left foot there is an eschar which appears to be improving.  It measures 2.5 x 2 cm today and there is no probing, undermining or tunneling.  There is mild surrounding erythema but this is likely more from inflammation as opposed to infection and there is no increase in warmth, ascending cellulitis.  There is no fluctuation or crepitation.  There is no malodor. Wound on the corresponding aspects of the second third toes which are superficial with a granular wound base.  There is no probing, undermining or tunneling.  No surrounding redness or drainage.  No pus. Decrease edema to the ankle.  There is no pain with ankle joint range of motion.  There is no pain to the ankle although he does  have significant neuropathy. No other open lesions or pre-ulcerative lesions No pain with calf compression, swelling, warmth, erythema        Assessment: Left ankle fracture, ulcerations left second third toe as well as dorsal foot  Plan: -All treatment options discussed with the patient including all alternatives, risks, complications.  -X-rays were obtained and reviewed.  No definitive evidence of cortical destruction suggestive of osteomyelitis.  There is evidence of healing along the ankle fracture.  No gross dislocation is present. -Continue with Silvadene dressing changes to the area daily to the dorsal wound and Betadine to the digits. -Offloading at all times. -Immobilization to the ankle fracture.  He can use the cam boot when he is to offload the wounds and pat it well. -Monitor for any clinical signs or symptoms of infection and directed to call the office immediately should any occur or go to the ER. -He has an upcoming appointment with Dr. Gwenlyn Found next week.  -Follow-up as scheduled or sooner if needed.  Call any questions or concerns.  *X-ray left foot and ankle next appointment  Trula Slade DPM

## 2017-11-09 DIAGNOSIS — N186 End stage renal disease: Secondary | ICD-10-CM | POA: Diagnosis not present

## 2017-11-09 DIAGNOSIS — E1129 Type 2 diabetes mellitus with other diabetic kidney complication: Secondary | ICD-10-CM | POA: Diagnosis not present

## 2017-11-09 DIAGNOSIS — N2581 Secondary hyperparathyroidism of renal origin: Secondary | ICD-10-CM | POA: Diagnosis not present

## 2017-11-09 DIAGNOSIS — Z992 Dependence on renal dialysis: Secondary | ICD-10-CM | POA: Diagnosis not present

## 2017-11-09 DIAGNOSIS — E1122 Type 2 diabetes mellitus with diabetic chronic kidney disease: Secondary | ICD-10-CM | POA: Diagnosis not present

## 2017-11-10 ENCOUNTER — Ambulatory Visit (INDEPENDENT_AMBULATORY_CARE_PROVIDER_SITE_OTHER): Payer: Medicare Other | Admitting: Cardiovascular Disease

## 2017-11-10 ENCOUNTER — Encounter: Payer: Self-pay | Admitting: Cardiovascular Disease

## 2017-11-10 VITALS — BP 120/64 | HR 94 | Ht 70.0 in | Wt 295.0 lb

## 2017-11-10 DIAGNOSIS — I10 Essential (primary) hypertension: Secondary | ICD-10-CM

## 2017-11-10 DIAGNOSIS — S91302A Unspecified open wound, left foot, initial encounter: Secondary | ICD-10-CM | POA: Diagnosis not present

## 2017-11-10 NOTE — Patient Instructions (Signed)
Medication Instructions: Your physician recommends that you continue on your current medications as directed. Please refer to the Current Medication list given to you today.   Follow-Up: Your physician recommends that you schedule a follow-up appointment as needed with Dr. Berry.    

## 2017-11-10 NOTE — Assessment & Plan Note (Signed)
[name] [name] was referred to me by Dr. Earleen Newport for a healing left foot wound. This probably was a result of trauma. He did have recent Dopplers that showed noncompressible vessels, inaccurate ABIs but patent large vessels otherwise. I can palpate pedal pulses. I do not think his wound is ischemically mediated and we'll see him back as needed.

## 2017-11-10 NOTE — Progress Notes (Signed)
11/10/2017 [name] [name]   07-08-1966  [id number]  Primary Physician Hoyt Koch, MD Primary Cardiologist: Lorretta Harp MD Lupe Carney, [place]  HPI:  [name] [name] is a 52 y.o. moderately overweight divorced Caucasian male first seen by Dr. Jacqualyn Posey , his podiatrist, for a wound on the dorsum of his left foot. He is followed by Dr. Angelena Form  for cardiology.he has a history of end-stage renal disease status post renal transplant which has since failed back on hemodialysis. History of hypertension, diabetes and hyperlipidemia. He's never had a heart attack or stroke. He has had endocarditis in the past. He's had a wound on the dorsum of his left foot which is slowly healing. Recent Dopplers performed 10/22/17 revealed noncompressible vessels, an accurate ABIs but otherwise patent vessels suggesting this is not ischemically mediated.   Current Meds  Medication Sig  . BD PEN NEEDLE NANO U/F 32G X 4 MM MISC USE  TO INJECT FIVE TIMES DAILY AS DIRECTED  . calcium acetate (PHOSLO) 667 MG capsule Take 3 capsules (2,001 mg total) by mouth 3 (three) times daily with meals.  . cephALEXin (KEFLEX) 500 MG capsule Take 1 capsule (500 mg total) by mouth 2 (two) times daily.  . clonazePAM (KLONOPIN) 1 MG tablet   . cloNIDine (CATAPRES) 0.3 MG tablet TAKE 1 TABLET THREE TIMES DAILY  . Febuxostat (ULORIC) 80 MG TABS Take 1 tablet (80 mg total) by mouth daily.  Marland Kitchen glucose blood (ACCU-CHEK GUIDE) test strip Check blood sugar four times daily  . glucose blood (ACCU-CHEK SMARTVIEW) test strip USE FOUR TIMES DAILY AS DIRECTED TO CHECK BLOOD SUGAR  . HUMULIN R U-500 KWIKPEN 500 UNIT/ML kwikpen INJECT SUBCUTANEOUSLY 15 UNITS AT BREAKFAST, 25 UNITS AT LUNCH AND 20 UNITS BEFORE SUPPER   . hydrocortisone cream 1 % Apply to affected area 2 times daily  . Insulin Glargine (TOUJEO SOLOSTAR) 300 UNIT/ML SOPN Inject 90 Units into the skin 2 (two) times daily.  . Insulin Pen Needle 32G X 8 MM MISC Use as  directed  . labetalol (NORMODYNE) 100 MG tablet TAKE 1 TABLET THREE TIMES DAILY  . Lancets (ACCU-CHEK MULTICLIX) lancets Check blood sugar four times daily  . metoprolol succinate (TOPROL-XL) 25 MG 24 hr tablet Take 1 tablet (25 mg total) by mouth daily.  . midodrine (PROAMATINE) 10 MG tablet Take 10 mg by mouth as needed (low blood pressure).   Marland Kitchen ofloxacin (FLOXIN) 0.3 % OTIC solution Place 10 drops into the right ear daily. For a total of 7 days  . omeprazole (PRILOSEC) 20 MG capsule TAKE 1 CAPSULE EVERY DAY  . RENVELA 800 MG tablet Take 2,400 mg by mouth 3 (three) times daily with meals.   . rosuvastatin (CRESTOR) 20 MG tablet TAKE 1 TABLET AT BEDTIME  . silver sulfADIAZINE (SILVADENE) 1 % cream Apply 1 application topically daily.  . silver sulfADIAZINE (SILVADENE) 1 % cream Apply 1 application topically daily.  . traMADol (ULTRAM) 50 MG tablet Take 1 tablet (50 mg total) by mouth every 8 (eight) hours as needed.  . VELTASSA 8.4 g packet Take 8.4 g by mouth daily.   Marland Kitchen warfarin (COUMADIN) 5 MG tablet Take 1 (5 mg) tablet every day except for Wednesday & Saturday when you take 7.5mg  (1.5 tablets) OR As directed.     Allergies  Allergen Reactions  . Pork-Derived Products Anaphylaxis and Other (See Comments)    Fever also   . Ace Inhibitors Cough  . Hydrocodone Other (See Comments)  Hallucinations    Social History   Socioeconomic History  . Marital status: Divorced    Spouse name: Not on file  . Number of children: 0  . Years of education: College   . Highest education level: Not on file  Social Needs  . Financial resource strain: Not on file  . Food insecurity - worry: Not on file  . Food insecurity - inability: Not on file  . Transportation needs - medical: Not on file  . Transportation needs - non-medical: Not on file  Occupational History    Employer: UNEMPLOYED  Tobacco Use  . Smoking status: Never Smoker  . Smokeless tobacco: Never Used  Substance and Sexual  Activity  . Alcohol use: No    Alcohol/week: 0.0 oz  . Drug use: No  . Sexual activity: Not Currently  Other Topics Concern  . Not on file  Social History Narrative   Patient is Divorced.   Patient does not have any children.   Patient is right-handed   Patient is currently in college working on his Ph.D.        Review of Systems: General: negative for chills, fever, night sweats or weight changes.  Cardiovascular: negative for chest pain, dyspnea on exertion, edema, orthopnea, palpitations, paroxysmal nocturnal dyspnea or shortness of breath Dermatological: negative for rash Respiratory: negative for cough or wheezing Urologic: negative for hematuria Abdominal: negative for nausea, vomiting, diarrhea, bright red blood per rectum, melena, or hematemesis Neurologic: negative for visual changes, syncope, or dizziness All other systems reviewed and are otherwise negative except as noted above.    Blood pressure 120/64, pulse 94, height 5\' 10"  (1.778 m), weight 295 lb (133.8 kg).  General appearance: alert and no distress Neck: no adenopathy, no carotid bruit, no JVD, supple, symmetrical, trachea midline and thyroid not enlarged, symmetric, no tenderness/mass/nodules Lungs: clear to auscultation bilaterally Heart: regular rate and rhythm, S1, S2 normal, no murmur, click, rub or gallop Extremities: extremities normal, atraumatic, no cyanosis or edema Pulses: 2+ and symmetric Skin: Skin color, texture, turgor normal. No rashes or lesions Neurologic: Alert and oriented X 3, normal strength and tone. Normal symmetric reflexes. Normal coordination and gait  EKG sinuses are 94 with right bundle branch block and left anterior fascicular block with inferior Q waves. I personally reviewed this EKG  ASSESSMENT AND PLAN:   Unspecified open wound, left foot, initial encounter [name] was referred to me by Dr. Earleen Newport for a healing left foot wound. This probably was a result of trauma. He  did have recent Dopplers that showed noncompressible vessels, inaccurate ABIs but patent large vessels otherwise. I can palpate pedal pulses. I do not think his wound is ischemically mediated and we'll see him back as needed.      Lorretta Harp MD FACP,FACC,FAHA, [facility] 11/10/2017 11:15 AM

## 2017-11-11 DIAGNOSIS — N2581 Secondary hyperparathyroidism of renal origin: Secondary | ICD-10-CM | POA: Diagnosis not present

## 2017-11-11 DIAGNOSIS — E1129 Type 2 diabetes mellitus with other diabetic kidney complication: Secondary | ICD-10-CM | POA: Diagnosis not present

## 2017-11-11 DIAGNOSIS — I82492 Acute embolism and thrombosis of other specified deep vein of left lower extremity: Secondary | ICD-10-CM | POA: Diagnosis not present

## 2017-11-11 DIAGNOSIS — N186 End stage renal disease: Secondary | ICD-10-CM | POA: Diagnosis not present

## 2017-11-11 DIAGNOSIS — Z992 Dependence on renal dialysis: Secondary | ICD-10-CM | POA: Diagnosis not present

## 2017-11-11 DIAGNOSIS — E1122 Type 2 diabetes mellitus with diabetic chronic kidney disease: Secondary | ICD-10-CM | POA: Diagnosis not present

## 2017-11-12 DIAGNOSIS — Z992 Dependence on renal dialysis: Secondary | ICD-10-CM | POA: Diagnosis not present

## 2017-11-12 DIAGNOSIS — N186 End stage renal disease: Secondary | ICD-10-CM | POA: Diagnosis not present

## 2017-11-12 DIAGNOSIS — E1122 Type 2 diabetes mellitus with diabetic chronic kidney disease: Secondary | ICD-10-CM | POA: Diagnosis not present

## 2017-11-13 DIAGNOSIS — E1122 Type 2 diabetes mellitus with diabetic chronic kidney disease: Secondary | ICD-10-CM | POA: Diagnosis not present

## 2017-11-13 DIAGNOSIS — E1129 Type 2 diabetes mellitus with other diabetic kidney complication: Secondary | ICD-10-CM | POA: Diagnosis not present

## 2017-11-13 DIAGNOSIS — N2581 Secondary hyperparathyroidism of renal origin: Secondary | ICD-10-CM | POA: Diagnosis not present

## 2017-11-13 DIAGNOSIS — N186 End stage renal disease: Secondary | ICD-10-CM | POA: Diagnosis not present

## 2017-11-16 DIAGNOSIS — E1129 Type 2 diabetes mellitus with other diabetic kidney complication: Secondary | ICD-10-CM | POA: Diagnosis not present

## 2017-11-16 DIAGNOSIS — E1122 Type 2 diabetes mellitus with diabetic chronic kidney disease: Secondary | ICD-10-CM | POA: Diagnosis not present

## 2017-11-16 DIAGNOSIS — N2581 Secondary hyperparathyroidism of renal origin: Secondary | ICD-10-CM | POA: Diagnosis not present

## 2017-11-16 DIAGNOSIS — N186 End stage renal disease: Secondary | ICD-10-CM | POA: Diagnosis not present

## 2017-11-17 ENCOUNTER — Ambulatory Visit: Payer: Self-pay | Admitting: Skilled Nursing Facility1

## 2017-11-17 ENCOUNTER — Ambulatory Visit (INDEPENDENT_AMBULATORY_CARE_PROVIDER_SITE_OTHER): Payer: Medicare Other | Admitting: General Practice

## 2017-11-17 DIAGNOSIS — Z7901 Long term (current) use of anticoagulants: Secondary | ICD-10-CM | POA: Diagnosis not present

## 2017-11-17 NOTE — Patient Instructions (Addendum)
Pre visit review using our clinic review tool, if applicable. No additional management support is needed unless otherwise documented below in the visit note.  Continue to take 1 tablet daily except 1 1/2 tablets on Wednesdays/Saturday.  Re-check in 1 week. Instructions given to patient.  Dialysis Pt.  Patient has been called with dosing instructions. Pt verbalizes understanding.

## 2017-11-18 DIAGNOSIS — E1122 Type 2 diabetes mellitus with diabetic chronic kidney disease: Secondary | ICD-10-CM | POA: Diagnosis not present

## 2017-11-18 DIAGNOSIS — I82492 Acute embolism and thrombosis of other specified deep vein of left lower extremity: Secondary | ICD-10-CM | POA: Diagnosis not present

## 2017-11-18 DIAGNOSIS — N2581 Secondary hyperparathyroidism of renal origin: Secondary | ICD-10-CM | POA: Diagnosis not present

## 2017-11-18 DIAGNOSIS — N186 End stage renal disease: Secondary | ICD-10-CM | POA: Diagnosis not present

## 2017-11-18 DIAGNOSIS — E1129 Type 2 diabetes mellitus with other diabetic kidney complication: Secondary | ICD-10-CM | POA: Diagnosis not present

## 2017-11-18 NOTE — Progress Notes (Signed)
Medical treatment/procedure(s) were performed by non-physician practitioner and as supervising physician I was immediately available for consultation/collaboration. I agree with above. Brigido Mera A Deziah Renwick, MD  

## 2017-11-20 DIAGNOSIS — E1122 Type 2 diabetes mellitus with diabetic chronic kidney disease: Secondary | ICD-10-CM | POA: Diagnosis not present

## 2017-11-20 DIAGNOSIS — N2581 Secondary hyperparathyroidism of renal origin: Secondary | ICD-10-CM | POA: Diagnosis not present

## 2017-11-20 DIAGNOSIS — E1129 Type 2 diabetes mellitus with other diabetic kidney complication: Secondary | ICD-10-CM | POA: Diagnosis not present

## 2017-11-20 DIAGNOSIS — N186 End stage renal disease: Secondary | ICD-10-CM | POA: Diagnosis not present

## 2017-11-23 DIAGNOSIS — E1122 Type 2 diabetes mellitus with diabetic chronic kidney disease: Secondary | ICD-10-CM | POA: Diagnosis not present

## 2017-11-23 DIAGNOSIS — N186 End stage renal disease: Secondary | ICD-10-CM | POA: Diagnosis not present

## 2017-11-23 DIAGNOSIS — E1129 Type 2 diabetes mellitus with other diabetic kidney complication: Secondary | ICD-10-CM | POA: Diagnosis not present

## 2017-11-23 DIAGNOSIS — N2581 Secondary hyperparathyroidism of renal origin: Secondary | ICD-10-CM | POA: Diagnosis not present

## 2017-11-24 ENCOUNTER — Encounter: Payer: Self-pay | Admitting: Endocrinology

## 2017-11-24 ENCOUNTER — Ambulatory Visit (INDEPENDENT_AMBULATORY_CARE_PROVIDER_SITE_OTHER): Payer: Medicare Other | Admitting: Endocrinology

## 2017-11-24 ENCOUNTER — Other Ambulatory Visit: Payer: Self-pay

## 2017-11-24 VITALS — BP 128/64 | HR 88 | Ht 70.0 in | Wt 299.4 lb

## 2017-11-24 DIAGNOSIS — Z794 Long term (current) use of insulin: Secondary | ICD-10-CM

## 2017-11-24 DIAGNOSIS — E1165 Type 2 diabetes mellitus with hyperglycemia: Secondary | ICD-10-CM

## 2017-11-24 MED ORDER — INSULIN GLARGINE 300 UNIT/ML ~~LOC~~ SOPN: 70.0000 [IU] | PEN_INJECTOR | Freq: Two times a day (BID) | SUBCUTANEOUS | 2 refills | Status: DC

## 2017-11-24 MED ORDER — GLUCOSE BLOOD VI STRP: ORAL_STRIP | 0 refills | Status: DC

## 2017-11-24 MED ORDER — INSULIN PEN NEEDLE 32G X 4 MM MISC: 1 refills | Status: DC

## 2017-11-24 MED ORDER — INSULIN REGULAR HUMAN (CONC) 500 UNIT/ML ~~LOC~~ SOPN: PEN_INJECTOR | SUBCUTANEOUS | 0 refills | Status: DC

## 2017-11-24 NOTE — Progress Notes (Signed)
Patient ID: [name] [name], male   DOB: Apr 07, 1966, 52 y.o.   MRN: [id number]           Reason for Appointment: Follow-up for Type 2 Diabetes  History of Present Illness:          Date of diagnosis of type 2 diabetes mellitus : 1990       Background history:  He has been taking insulin since about 1999, previously was on unknown oral agents He had good control with insulin initially but A1c was much higher in 2008 and subsequently has had required larger doses of insulin He has been on Lantus for the last few years along with Novolog,followed by an endocrinologist since 2008 His A1c was 6.3 in 04/2014   Prior to his consultation he was on a regimen of Novolog , 50 units 5 times a day with each meal and snack along with 90 Units 3 times a day of Lantus   Recent history:   INSULIN regimen is :  U-500 insulin with KwikPen 25 before bfst, 35 before lunch and 30 before dinner, usually 30 min before  Sliding scale use for high sugars: Sugar 150-199 take 2 units, 200-250= 4 units and over 250 take 6 units   TOUJEO 80 units twice daily  A1c is higher than usual at 7.9  He has not been seen in follow-up since 07/2017 Current blood sugar patterns and problems identified:  He again says that he does not have enough test strips to be checking blood sugars as directed and is only checking them in the mornings, not clear if some of these readings are before or after breakfast  More recently is getting low blood sugars are early morning and most of his blood sugars in the mornings are below 90, some readings may be after eating in the mornings around 8-9 AM  Even though his Toujeo was reduced by 10 units on the last visit his blood sugars still tend to be low  Not clear why his A1c is high but he thinks he had intercurrent illnesses in November and December causing his A1c be high in January  He also has gained a lot of weight  Has not changed his diet  He is usually trying to take his  Humulin R 30 min before eating but not clear if his doses are appropriate since he does not check readings after meals  Has had one low blood sugar documented after evening meal   Compliance with the medical regimen: Inconsistent Hypoglycemia: As above  Glucose monitoring:  done 0-2 times a day         Glucometer:    Aviva      Blood Glucose readings by download:  Mean values apply above for all meters except median for One Touch  PRE-MEAL Fasting Lunch Dinner Bedtime Overall  Glucose range: 56-84       Mean/median:     102   POST-MEAL PC Breakfast PC Lunch PC Dinner  Glucose range: 88-269   58, 146   Mean/median:      Previous readings:  Mean values apply above for all meters except median for One Touch  PRE-MEAL Fasting Lunch Dinner Overnight  Overall  Glucose range: 58-292   151, 338  55-187    Mean/median:     137     Self-care: The diet that the patient has been following is: tries to limit high-fat meals, usually eating  2-3 meals a day.     Typical  meal intake: Breakfast is grits with pecans, olive oil. Dinner 5-6 pm                Dietician visit, most recent:2013               Exercise:  unable to do any, In wheelchair  Weight history:  Wt Readings from Last 3 Encounters:  11/24/17 299 lb 6.4 oz (135.8 kg)  11/10/17 295 lb (133.8 kg)  09/24/17 287 lb (130.2 kg)    Glycemic control:   Lab Results  Component Value Date   HGBA1C 7.9 (H) 10/20/2017   HGBA1C 7.2 (H) 05/19/2017   HGBA1C 7.3 (H) 01/22/2017   Lab Results  Component Value Date   MICROALBUR 232.9 (H) 06/19/2015   LDLCALC 36 07/23/2017   CREATININE 6.12 (H) 12/15/2016    Lab Results  Component Value Date   FRUCTOSAMINE 408 (H) 07/23/2017   FRUCTOSAMINE 408 (H) 05/19/2017   FRUCTOSAMINE 361 (H) 10/26/2016      Allergies as of 11/24/2017      Reactions   Pork-derived Products Anaphylaxis, Other (See Comments)   Fever also    Ace Inhibitors Cough   Hydrocodone Other (See Comments)     Hallucinations      Medication List        Accurate as of 11/24/17  5:53 PM. Always use your most recent med list.          accu-chek multiclix lancets Check blood sugar four times daily   calcium acetate 667 MG capsule Commonly known as:  PHOSLO Take 3 capsules (2,001 mg total) by mouth 3 (three) times daily with meals.   cephALEXin 500 MG capsule Commonly known as:  KEFLEX Take 1 capsule (500 mg total) by mouth 2 (two) times daily.   clonazePAM 1 MG tablet Commonly known as:  KLONOPIN   cloNIDine 0.3 MG tablet Commonly known as:  CATAPRES TAKE 1 TABLET THREE TIMES DAILY   Febuxostat 80 MG Tabs Commonly known as:  ULORIC Take 1 tablet (80 mg total) by mouth daily.   glucose blood test strip Commonly known as:  ACCU-CHEK GUIDE Check blood sugar four times daily   glucose blood test strip Commonly known as:  ACCU-CHEK SMARTVIEW USE FOUR TIMES DAILY AS DIRECTED TO CHECK BLOOD SUGAR   hydrocortisone cream 1 % Apply to affected area 2 times daily   Insulin Glargine 300 UNIT/ML Sopn Commonly known as:  TOUJEO SOLOSTAR Inject 70-80 Units into the skin 2 (two) times daily. Inject 80 units in the morning and inject 70 units in the evening.   Insulin Pen Needle 32G X 8 MM Misc Use as directed   Insulin Pen Needle 32G X 4 MM Misc Commonly known as:  BD PEN NEEDLE NANO U/F USE  TO INJECT FIVE TIMES DAILY AS DIRECTED   insulin regular human CONCENTRATED 500 UNIT/ML kwikpen Commonly known as:  HUMULIN R U-500 KWIKPEN INJECT SUBCUTANEOUSLY 15 UNITS AT BREAKFAST, 25 UNITS AT LUNCH AND 25-30 UNITS BEFORE SUPPER   labetalol 100 MG tablet Commonly known as:  NORMODYNE TAKE 1 TABLET THREE TIMES DAILY   metoprolol succinate 25 MG 24 hr tablet Commonly known as:  TOPROL-XL Take 1 tablet (25 mg total) by mouth daily.   midodrine 10 MG tablet Commonly known as:  PROAMATINE Take 10 mg by mouth as needed (low blood pressure).   ofloxacin 0.3 % OTIC solution Commonly  known as:  FLOXIN Place 10 drops into the right ear daily. For a total  of 7 days   omeprazole 20 MG capsule Commonly known as:  PRILOSEC TAKE 1 CAPSULE EVERY DAY   RENVELA 800 MG tablet Generic drug:  sevelamer carbonate Take 2,400 mg by mouth 3 (three) times daily with meals.   rosuvastatin 20 MG tablet Commonly known as:  CRESTOR TAKE 1 TABLET AT BEDTIME   silver sulfADIAZINE 1 % cream Commonly known as:  SILVADENE Apply 1 application topically daily.   silver sulfADIAZINE 1 % cream Commonly known as:  SILVADENE Apply 1 application topically daily.   traMADol 50 MG tablet Commonly known as:  ULTRAM Take 1 tablet (50 mg total) by mouth every 8 (eight) hours as needed.   VELTASSA 8.4 g packet Generic drug:  patiromer Take 8.4 g by mouth daily.   warfarin 5 MG tablet Commonly known as:  COUMADIN Take as directed by the anticoagulation clinic. If you are unsure how to take this medication, talk to your nurse or doctor. Original instructions:  Take 1 (5 mg) tablet every day except for Wednesday & Saturday when you take 7.32m (1.5 tablets) OR As directed.       Allergies:  Allergies  Allergen Reactions  . Pork-Derived Products Anaphylaxis and Other (See Comments)    Fever also   . Ace Inhibitors Cough  . Hydrocodone Other (See Comments)    Hallucinations    Past Medical History:  Diagnosis Date  . Allergic rhinitis   . Anemia   . Aortic insufficiency    a. Echo 7/16:  Mod LVH, EF 50-55%, mild to mod AI, severe LAE, PASP 57 mmHg (LV ID end diastolic 563.0mm);  b. Echo 2/17:  Mod LVH, EF 50-55%, mod AI, MAC, mild MR, mod LAE, mild to mod TR, PASP 63 mmHg  . Bilateral carpal tunnel syndrome   . CHF (congestive heart failure) ([place]   . Chronic kidney disease    Kidney Transplant 2009 09/26/15- transplanated kidney failing  . Chronic kidney disease (CKD), stage IV (severe) ([facility]   . Chronic renal allograft nephropathy   . Claustrophobia   . Complication of  anesthesia    [facility]."  . COPD (chronic obstructive pulmonary disease) ([facility]   . Diabetes mellitus    Type II  . Diabetic peripheral neuropathy ([facility]   . Diabetic retinopathy ([facility]   . Diminished eyesight   . Discitis of lumbar region    resolved  . DVT (deep venous thrombosis) ([facility] 10/07/2015   LLE  . Dyslipidemia   . Endocarditis   . Endocarditis 11/28/14  . Gait disorder   . Gastroparesis   . GERD (gastroesophageal reflux disease)   . Gout   . Hearing difficulty   . Hyperlipidemia   . Hypertension   . Incomplete bladder emptying   . Leg pain 02/05/11   with walking  . Morbid obesity ([facility]   . Nervousness(799.21)   . Obstructive sleep apnea    not using CPAP- lost 100lbs  . Osteomyelitis ([facility]   . Posttraumatic stress disorder   . Renal transplant, status post 04/05/2014  . Rotator cuff disorder   . Secondary hyperparathyroidism ([facility]   . Sepsis ([facility]    Postive blood cultures -   . Shortness of breath    when lying flat  . Urethral stricture   . Vitamin D deficiency   . Weight gain     Past Surgical History:  Procedure Laterality Date  . ARTERIOVENOUS GRAFT PLACEMENT Bilateral "  several"  . AV FISTULA PLACEMENT Bilateral 2015  . BASCILIC VEIN TRANSPOSITION Right 09/27/2015  . [place] Right 09/27/2015   Procedure: BASILIC VEIN TRANSPOSITION right;  Surgeon: Mal Misty, MD;  Location: [facility];  Service: Vascular;  Laterality: Right;  . CATARACT EXTRACTION W/ INTRAOCULAR LENS  IMPLANT, BILATERAL Bilateral   . CYSTOSCOPY W/ INTERNAL URETHROTOMY  09/2014  . KIDNEY TRANSPLANT  2009    Family History  Problem Relation Age of Onset  . Hypertension Mother   . Diabetes Father     Social History:  reports that  has never smoked. he has never used smokeless tobacco. He reports that he does not drink alcohol or use drugs.    Review of Systems    ROS  OBESITY: He says he wants to  get the bariatric surgery done  Lipid history: He is on 20 mg Crestor With the following results    Lab Results  Component Value Date   CHOL 93 07/23/2017   HDL 26.10 (L) 07/23/2017   LDLCALC 36 07/23/2017   TRIG 157.0 (H) 07/23/2017   CHOLHDL 4 07/23/2017            Hypertension: His blood pressure is Followed by nephrologist and PCP  Again on 0.3 mg dose of clonidine along with labetalol  Tachycardia is controlled with metoprolol that was started on his last visit  RENAL: He has had a transplant in 2009 and  he is on dialysis  Neurological: Has has had  decreased sensation in his feet, no burning Previously followed by neurologist  He has had marked decrease in his balance and also some weakness in his legs, using a power scooter  Last foot exam was in 02/2017: Absent monofilament sensation in his feet and toes and extending up to the middle of the lower leg on the left and up to the ankle on the right  LABS:  No visits with results within 1 Week(s) from this visit.  Latest known visit with results is:  Anti-coag visit on 11/17/2017  Component Date Value Ref Range Status  . INR 11/05/2017 2.5   Final   INR reported by [facility]    Physical Examination:  BP 128/64 (BP Location: Left Arm, Patient Position: Sitting, Cuff Size: Large)   Pulse 88   Ht 5' 10"  (1.778 m)   Wt 299 lb 6.4 oz (135.8 kg)   SpO2 98%   BMI 42.96 kg/m      ASSESSMENT:  Diabetes type 2, long-standing with obesity See history of present illness for detailed discussion of his current management, blood sugar patterns and problems identified.  His A1c in January was higher which he thinks was because of infections, inactivity and other problems .  Not clear why he continues to have difficulty getting his supplies for testing He again has not checked blood sugars much and mostly in the morning As before his morning sugars are tending to be low Not consistently adjusting  insulin before eating based on what he is eating Most likely may be getting too much insulin at suppertime with at least one low sugar episode after dinner and tendency to early morning low sugars also  Weight gain: Likely to be from tendency to hypoglycemia and activity  HYPERTENSION: Followed by nephrologist, blood pressure is  normal today  PLAN:   New prescription for test strips sent to his [facility]  He will Need to start checking blood sugars after meals and also at  least before each meal also  Again discussed that if he is able to check 4 times a day he will be able to get the freestyle [place], showed him how this would be used also  Although he may do well with the Omnipod pump will need to assess his control first on his follow-up visit with more monitoring and also CGM if possible  He will reduce basal insulin in the evening by 10 units  Also adjust mealtime doses based on what he is eating and take at least 5 units less from his current dose at suppertime for now  May consider Victoza if he continues to have weight gain   Patient Instructions  Check blood sugars on waking up 3/7   Also check blood sugars about 2 hours after a meal and do this after different meals by rotation  Recommended blood sugar levels on waking up is 90-130 and about 2 hours after meal is 130-160  Please bring your blood sugar monitor to each visit, thank you  Humulin 25-30 at dinner  Toujeo 80  Am and 70 in pm    Counseling time on subjects discussed in assessment and plan sections is over 50% of today's 25 minute visit   Elayne Snare 11/24/2017, 5:53 PM   Note: This office note was prepared with Dragon voice recognition system technology. Any transcriptional errors that result from this process are unintentional.

## 2017-11-24 NOTE — Patient Instructions (Signed)
Check blood sugars on waking up 3/7   Also check blood sugars about 2 hours after a meal and do this after different meals by rotation  Recommended blood sugar levels on waking up is 90-130 and about 2 hours after meal is 130-160  Please bring your blood sugar monitor to each visit, thank you  Humulin 25-30 at dinner  Toujeo 80  Am and 70 in pm

## 2017-11-25 DIAGNOSIS — E1122 Type 2 diabetes mellitus with diabetic chronic kidney disease: Secondary | ICD-10-CM | POA: Diagnosis not present

## 2017-11-25 DIAGNOSIS — N2581 Secondary hyperparathyroidism of renal origin: Secondary | ICD-10-CM | POA: Diagnosis not present

## 2017-11-25 DIAGNOSIS — N186 End stage renal disease: Secondary | ICD-10-CM | POA: Diagnosis not present

## 2017-11-25 DIAGNOSIS — I82492 Acute embolism and thrombosis of other specified deep vein of left lower extremity: Secondary | ICD-10-CM | POA: Diagnosis not present

## 2017-11-25 DIAGNOSIS — E1129 Type 2 diabetes mellitus with other diabetic kidney complication: Secondary | ICD-10-CM | POA: Diagnosis not present

## 2017-11-26 ENCOUNTER — Ambulatory Visit: Payer: Medicare Other | Admitting: Podiatry

## 2017-11-27 DIAGNOSIS — N186 End stage renal disease: Secondary | ICD-10-CM | POA: Diagnosis not present

## 2017-11-27 DIAGNOSIS — E1122 Type 2 diabetes mellitus with diabetic chronic kidney disease: Secondary | ICD-10-CM | POA: Diagnosis not present

## 2017-11-27 DIAGNOSIS — E1129 Type 2 diabetes mellitus with other diabetic kidney complication: Secondary | ICD-10-CM | POA: Diagnosis not present

## 2017-11-27 DIAGNOSIS — N2581 Secondary hyperparathyroidism of renal origin: Secondary | ICD-10-CM | POA: Diagnosis not present

## 2017-11-30 DIAGNOSIS — N186 End stage renal disease: Secondary | ICD-10-CM | POA: Diagnosis not present

## 2017-11-30 DIAGNOSIS — N2581 Secondary hyperparathyroidism of renal origin: Secondary | ICD-10-CM | POA: Diagnosis not present

## 2017-11-30 DIAGNOSIS — E1122 Type 2 diabetes mellitus with diabetic chronic kidney disease: Secondary | ICD-10-CM | POA: Diagnosis not present

## 2017-11-30 DIAGNOSIS — E1129 Type 2 diabetes mellitus with other diabetic kidney complication: Secondary | ICD-10-CM | POA: Diagnosis not present

## 2017-12-01 ENCOUNTER — Ambulatory Visit (INDEPENDENT_AMBULATORY_CARE_PROVIDER_SITE_OTHER): Payer: Medicare Other | Admitting: General Practice

## 2017-12-01 DIAGNOSIS — Z7901 Long term (current) use of anticoagulants: Secondary | ICD-10-CM

## 2017-12-01 LAB — POCT INR: INR: 2.23

## 2017-12-01 NOTE — Patient Instructions (Addendum)
Pre visit review using our clinic review tool, if applicable. No additional management support is needed unless otherwise documented below in the visit note.  Continue to take 1 tablet daily except 1 1/2 tablets on Wednesdays/Saturday.  Re-check in 1 week. Instructions given to patient.  Dialysis Pt.  Patient has been called with dosing instructions.  Pt verbalizes understanding.

## 2017-12-01 NOTE — Progress Notes (Signed)
I have reviewed and agree with this plan  

## 2017-12-02 DIAGNOSIS — N2581 Secondary hyperparathyroidism of renal origin: Secondary | ICD-10-CM | POA: Diagnosis not present

## 2017-12-02 DIAGNOSIS — N186 End stage renal disease: Secondary | ICD-10-CM | POA: Diagnosis not present

## 2017-12-02 DIAGNOSIS — E1129 Type 2 diabetes mellitus with other diabetic kidney complication: Secondary | ICD-10-CM | POA: Diagnosis not present

## 2017-12-02 DIAGNOSIS — I82492 Acute embolism and thrombosis of other specified deep vein of left lower extremity: Secondary | ICD-10-CM | POA: Diagnosis not present

## 2017-12-02 DIAGNOSIS — E1122 Type 2 diabetes mellitus with diabetic chronic kidney disease: Secondary | ICD-10-CM | POA: Diagnosis not present

## 2017-12-03 ENCOUNTER — Encounter: Payer: Self-pay | Admitting: Podiatry

## 2017-12-03 ENCOUNTER — Ambulatory Visit (INDEPENDENT_AMBULATORY_CARE_PROVIDER_SITE_OTHER): Payer: Medicare Other

## 2017-12-03 ENCOUNTER — Ambulatory Visit (INDEPENDENT_AMBULATORY_CARE_PROVIDER_SITE_OTHER): Payer: Medicare Other | Admitting: Podiatry

## 2017-12-03 VITALS — BP 116/64 | HR 106 | Resp 16

## 2017-12-03 DIAGNOSIS — S82892D Other fracture of left lower leg, subsequent encounter for closed fracture with routine healing: Secondary | ICD-10-CM | POA: Diagnosis not present

## 2017-12-03 DIAGNOSIS — S91309A Unspecified open wound, unspecified foot, initial encounter: Secondary | ICD-10-CM

## 2017-12-04 DIAGNOSIS — E1122 Type 2 diabetes mellitus with diabetic chronic kidney disease: Secondary | ICD-10-CM | POA: Diagnosis not present

## 2017-12-04 DIAGNOSIS — N186 End stage renal disease: Secondary | ICD-10-CM | POA: Diagnosis not present

## 2017-12-04 DIAGNOSIS — N2581 Secondary hyperparathyroidism of renal origin: Secondary | ICD-10-CM | POA: Diagnosis not present

## 2017-12-04 DIAGNOSIS — E1129 Type 2 diabetes mellitus with other diabetic kidney complication: Secondary | ICD-10-CM | POA: Diagnosis not present

## 2017-12-06 NOTE — Progress Notes (Signed)
Subjective: [name] [name] presents the office today for follow-up evaluation of left ankle fracture as well as wound to the top of the left foot.  He states that he is doing well he is ready to start exercising.  He states he has not been keeping the bandage on the wound as it is doing well and has not had any drainage or swelling denies any redness or red streaks.  He has no pain to the ankle although he does have neuropathy. Denies any systemic complaints such as fevers, chills, nausea, vomiting. No acute changes since last appointment, and no other complaints at this time.   Objective: AAO x3, NAD DP/PT pulses decreased bilaterally, CRT less than 3 seconds To the dorsal aspect midfoot appears to be a scab overlying the area the previous ulcer.  I was able to debride some of the loose hyperkeratotic tissue on the periphery of the wound and there is no drainage or pus expressed and there is no surrounding erythema, ascending cellulitis.  There is no fluctuation or crepitation.  There is no malodor.  Overall the wound, scab is about the same size of 2.5 x 2 cm but it is drying.  There is no pain or crepitation with ankle joint range of motion there is no restriction.  Palpation of the ankle joint.  The interdigital ulcerations of healed No open lesions or pre-ulcerative lesions.  No pain with calf compression, swelling, warmth, erythema  Assessment: Left dorsal foot ulceration, left ankle fracture  Plan: -All treatment options discussed with the patient including all alternatives, risks, complications.  -X-rays were obtained and reviewed.  There is increased consolidation across the fracture site with mild displacement of there is evidence of healing.  I want him to continue limit weightbearing hold off on exercise at this time.  He has not been wearing a boot and been wearing a regular shoe and doing transfers without any issues. -Continue antibiotic ointment to the wound of the dorsal foot daily.  Monitor for any clinical signs or symptoms of infection and directed to call the office immediately should any occur or go to the ER. -Patient encouraged to call the office with any questions, concerns, change in symptoms.   Trula Slade DPM

## 2017-12-07 DIAGNOSIS — E1122 Type 2 diabetes mellitus with diabetic chronic kidney disease: Secondary | ICD-10-CM | POA: Diagnosis not present

## 2017-12-07 DIAGNOSIS — N186 End stage renal disease: Secondary | ICD-10-CM | POA: Diagnosis not present

## 2017-12-07 DIAGNOSIS — E1129 Type 2 diabetes mellitus with other diabetic kidney complication: Secondary | ICD-10-CM | POA: Diagnosis not present

## 2017-12-07 DIAGNOSIS — N2581 Secondary hyperparathyroidism of renal origin: Secondary | ICD-10-CM | POA: Diagnosis not present

## 2017-12-09 DIAGNOSIS — E1129 Type 2 diabetes mellitus with other diabetic kidney complication: Secondary | ICD-10-CM | POA: Diagnosis not present

## 2017-12-09 DIAGNOSIS — N186 End stage renal disease: Secondary | ICD-10-CM | POA: Diagnosis not present

## 2017-12-09 DIAGNOSIS — I82492 Acute embolism and thrombosis of other specified deep vein of left lower extremity: Secondary | ICD-10-CM | POA: Diagnosis not present

## 2017-12-09 DIAGNOSIS — M109 Gout, unspecified: Secondary | ICD-10-CM | POA: Diagnosis not present

## 2017-12-09 DIAGNOSIS — N2581 Secondary hyperparathyroidism of renal origin: Secondary | ICD-10-CM | POA: Diagnosis not present

## 2017-12-09 DIAGNOSIS — E1122 Type 2 diabetes mellitus with diabetic chronic kidney disease: Secondary | ICD-10-CM | POA: Diagnosis not present

## 2017-12-10 DIAGNOSIS — E1122 Type 2 diabetes mellitus with diabetic chronic kidney disease: Secondary | ICD-10-CM | POA: Diagnosis not present

## 2017-12-10 DIAGNOSIS — N186 End stage renal disease: Secondary | ICD-10-CM | POA: Diagnosis not present

## 2017-12-10 DIAGNOSIS — Z992 Dependence on renal dialysis: Secondary | ICD-10-CM | POA: Diagnosis not present

## 2017-12-11 DIAGNOSIS — E8770 Fluid overload, unspecified: Secondary | ICD-10-CM | POA: Diagnosis not present

## 2017-12-11 DIAGNOSIS — E1122 Type 2 diabetes mellitus with diabetic chronic kidney disease: Secondary | ICD-10-CM | POA: Diagnosis not present

## 2017-12-11 DIAGNOSIS — N186 End stage renal disease: Secondary | ICD-10-CM | POA: Diagnosis not present

## 2017-12-11 DIAGNOSIS — E1129 Type 2 diabetes mellitus with other diabetic kidney complication: Secondary | ICD-10-CM | POA: Diagnosis not present

## 2017-12-11 DIAGNOSIS — N2581 Secondary hyperparathyroidism of renal origin: Secondary | ICD-10-CM | POA: Diagnosis not present

## 2017-12-14 DIAGNOSIS — E8770 Fluid overload, unspecified: Secondary | ICD-10-CM | POA: Diagnosis not present

## 2017-12-14 DIAGNOSIS — N2581 Secondary hyperparathyroidism of renal origin: Secondary | ICD-10-CM | POA: Diagnosis not present

## 2017-12-14 DIAGNOSIS — E1129 Type 2 diabetes mellitus with other diabetic kidney complication: Secondary | ICD-10-CM | POA: Diagnosis not present

## 2017-12-14 DIAGNOSIS — N186 End stage renal disease: Secondary | ICD-10-CM | POA: Diagnosis not present

## 2017-12-14 DIAGNOSIS — E1122 Type 2 diabetes mellitus with diabetic chronic kidney disease: Secondary | ICD-10-CM | POA: Diagnosis not present

## 2017-12-16 DIAGNOSIS — E8770 Fluid overload, unspecified: Secondary | ICD-10-CM | POA: Diagnosis not present

## 2017-12-16 DIAGNOSIS — I82492 Acute embolism and thrombosis of other specified deep vein of left lower extremity: Secondary | ICD-10-CM | POA: Diagnosis not present

## 2017-12-16 DIAGNOSIS — N186 End stage renal disease: Secondary | ICD-10-CM | POA: Diagnosis not present

## 2017-12-16 DIAGNOSIS — E1129 Type 2 diabetes mellitus with other diabetic kidney complication: Secondary | ICD-10-CM | POA: Diagnosis not present

## 2017-12-16 DIAGNOSIS — N2581 Secondary hyperparathyroidism of renal origin: Secondary | ICD-10-CM | POA: Diagnosis not present

## 2017-12-16 DIAGNOSIS — E1122 Type 2 diabetes mellitus with diabetic chronic kidney disease: Secondary | ICD-10-CM | POA: Diagnosis not present

## 2017-12-18 DIAGNOSIS — N2581 Secondary hyperparathyroidism of renal origin: Secondary | ICD-10-CM | POA: Diagnosis not present

## 2017-12-18 DIAGNOSIS — E8770 Fluid overload, unspecified: Secondary | ICD-10-CM | POA: Diagnosis not present

## 2017-12-18 DIAGNOSIS — E1129 Type 2 diabetes mellitus with other diabetic kidney complication: Secondary | ICD-10-CM | POA: Diagnosis not present

## 2017-12-18 DIAGNOSIS — E1122 Type 2 diabetes mellitus with diabetic chronic kidney disease: Secondary | ICD-10-CM | POA: Diagnosis not present

## 2017-12-18 DIAGNOSIS — N186 End stage renal disease: Secondary | ICD-10-CM | POA: Diagnosis not present

## 2017-12-20 ENCOUNTER — Other Ambulatory Visit: Payer: Self-pay | Admitting: Internal Medicine

## 2017-12-21 DIAGNOSIS — E8770 Fluid overload, unspecified: Secondary | ICD-10-CM | POA: Diagnosis not present

## 2017-12-21 DIAGNOSIS — N2581 Secondary hyperparathyroidism of renal origin: Secondary | ICD-10-CM | POA: Diagnosis not present

## 2017-12-21 DIAGNOSIS — E1122 Type 2 diabetes mellitus with diabetic chronic kidney disease: Secondary | ICD-10-CM | POA: Diagnosis not present

## 2017-12-21 DIAGNOSIS — N186 End stage renal disease: Secondary | ICD-10-CM | POA: Diagnosis not present

## 2017-12-21 DIAGNOSIS — E1129 Type 2 diabetes mellitus with other diabetic kidney complication: Secondary | ICD-10-CM | POA: Diagnosis not present

## 2017-12-21 LAB — PROTIME-INR: INR: 4.7 — AB (ref <=1.1)

## 2017-12-23 DIAGNOSIS — E1129 Type 2 diabetes mellitus with other diabetic kidney complication: Secondary | ICD-10-CM | POA: Diagnosis not present

## 2017-12-23 DIAGNOSIS — N2581 Secondary hyperparathyroidism of renal origin: Secondary | ICD-10-CM | POA: Diagnosis not present

## 2017-12-23 DIAGNOSIS — I82492 Acute embolism and thrombosis of other specified deep vein of left lower extremity: Secondary | ICD-10-CM | POA: Diagnosis not present

## 2017-12-23 DIAGNOSIS — N186 End stage renal disease: Secondary | ICD-10-CM | POA: Diagnosis not present

## 2017-12-23 DIAGNOSIS — E8770 Fluid overload, unspecified: Secondary | ICD-10-CM | POA: Diagnosis not present

## 2017-12-23 DIAGNOSIS — E1122 Type 2 diabetes mellitus with diabetic chronic kidney disease: Secondary | ICD-10-CM | POA: Diagnosis not present

## 2017-12-24 ENCOUNTER — Ambulatory Visit (INDEPENDENT_AMBULATORY_CARE_PROVIDER_SITE_OTHER): Payer: Medicare Other | Admitting: General Practice

## 2017-12-24 DIAGNOSIS — Z7901 Long term (current) use of anticoagulants: Secondary | ICD-10-CM | POA: Diagnosis not present

## 2017-12-24 LAB — PROTIME-INR: INR: 3.9 — AB (ref <=1.1)

## 2017-12-24 NOTE — Patient Instructions (Signed)
Pre visit review using our clinic review tool, if applicable. No additional management support is needed unless otherwise documented below in the visit note.  Hold coumadin today and tomorrow and then continue to take 1 tablet daily except 1 1/2 tablets on Wednesdays/Saturday.  Re-check in 1 week. Instructions given to patient.  Dialysis Pt.  Patient has been called with dosing instructions.  Pt verbalizes understanding.

## 2017-12-24 NOTE — Progress Notes (Signed)
Medical treatment/procedure(s) were performed by non-physician practitioner and as supervising physician I was immediately available for consultation/collaboration. I agree with above. Lin Glazier A Kenneisha Cochrane, MD  

## 2017-12-25 DIAGNOSIS — N186 End stage renal disease: Secondary | ICD-10-CM | POA: Diagnosis not present

## 2017-12-25 DIAGNOSIS — N2581 Secondary hyperparathyroidism of renal origin: Secondary | ICD-10-CM | POA: Diagnosis not present

## 2017-12-25 DIAGNOSIS — E8770 Fluid overload, unspecified: Secondary | ICD-10-CM | POA: Diagnosis not present

## 2017-12-25 DIAGNOSIS — E1129 Type 2 diabetes mellitus with other diabetic kidney complication: Secondary | ICD-10-CM | POA: Diagnosis not present

## 2017-12-25 DIAGNOSIS — E1122 Type 2 diabetes mellitus with diabetic chronic kidney disease: Secondary | ICD-10-CM | POA: Diagnosis not present

## 2017-12-27 ENCOUNTER — Ambulatory Visit (INDEPENDENT_AMBULATORY_CARE_PROVIDER_SITE_OTHER): Payer: Medicare Other | Admitting: Podiatry

## 2017-12-27 ENCOUNTER — Ambulatory Visit (INDEPENDENT_AMBULATORY_CARE_PROVIDER_SITE_OTHER): Payer: Medicare Other

## 2017-12-27 DIAGNOSIS — E8770 Fluid overload, unspecified: Secondary | ICD-10-CM | POA: Diagnosis not present

## 2017-12-27 DIAGNOSIS — E1122 Type 2 diabetes mellitus with diabetic chronic kidney disease: Secondary | ICD-10-CM | POA: Diagnosis not present

## 2017-12-27 DIAGNOSIS — S82892D Other fracture of left lower leg, subsequent encounter for closed fracture with routine healing: Secondary | ICD-10-CM

## 2017-12-27 DIAGNOSIS — N2581 Secondary hyperparathyroidism of renal origin: Secondary | ICD-10-CM | POA: Diagnosis not present

## 2017-12-27 DIAGNOSIS — L97521 Non-pressure chronic ulcer of other part of left foot limited to breakdown of skin: Secondary | ICD-10-CM

## 2017-12-27 DIAGNOSIS — S91309A Unspecified open wound, unspecified foot, initial encounter: Secondary | ICD-10-CM

## 2017-12-27 DIAGNOSIS — N186 End stage renal disease: Secondary | ICD-10-CM | POA: Diagnosis not present

## 2017-12-27 DIAGNOSIS — E1129 Type 2 diabetes mellitus with other diabetic kidney complication: Secondary | ICD-10-CM | POA: Diagnosis not present

## 2017-12-28 ENCOUNTER — Ambulatory Visit (INDEPENDENT_AMBULATORY_CARE_PROVIDER_SITE_OTHER): Payer: Medicare Other | Admitting: General Practice

## 2017-12-28 DIAGNOSIS — N186 End stage renal disease: Secondary | ICD-10-CM | POA: Diagnosis not present

## 2017-12-28 DIAGNOSIS — N2581 Secondary hyperparathyroidism of renal origin: Secondary | ICD-10-CM | POA: Diagnosis not present

## 2017-12-28 DIAGNOSIS — E1129 Type 2 diabetes mellitus with other diabetic kidney complication: Secondary | ICD-10-CM | POA: Diagnosis not present

## 2017-12-28 DIAGNOSIS — Z7901 Long term (current) use of anticoagulants: Secondary | ICD-10-CM

## 2017-12-28 DIAGNOSIS — E8770 Fluid overload, unspecified: Secondary | ICD-10-CM | POA: Diagnosis not present

## 2017-12-28 DIAGNOSIS — E1122 Type 2 diabetes mellitus with diabetic chronic kidney disease: Secondary | ICD-10-CM | POA: Diagnosis not present

## 2017-12-28 NOTE — Progress Notes (Signed)
Subjective: Rainer presents the office today for follow-up evaluation of left ankle fracture as well as wound to the top of the left foot.  He has not been immobilized and he has been doing some mild walking but he is eager to go back to starting the exercise bike at home.  He is continue with daily dressing changes to the wound and the top of the foot with a small amount of antibiotic ointment.  He has not noticed any drainage or pus.  He states that all the other wounds are doing very well and that he will does not has any new sores present.  denies any systemic complaints such as fevers, chills, nausea, vomiting. No acute changes since last appointment, and no other complaints at this time.   Objective: AAO x3, NAD DP/PT pulses decreased bilaterally, CRT less than 3 seconds To the dorsal aspect midfoot appears to be a scab overlying the area the previous ulcer.It is starting to have some epidermal lysis along the periphery not able to debride some of this today.  The wound today measures 2 x 2 cm .  There is no surrounding erythema, ascending cellulitis.  There is no fluctuation or crepitation.  There is no malodor.  There is no other open lesions or pre-ulcerative lesions.  The wounds to the toes have healed. There is no pain or crepitation with ankle joint range of motion there is no swelling to the ankle. No open lesions or pre-ulcerative lesions.  No pain with calf compression, swelling, warmth, erythema  Assessment: Left dorsal foot ulceration, left ankle fracture  Plan: -All treatment options discussed with the patient including all alternatives, risks, complications.  -X-rays were obtained and reviewed.  Callus formation present of the fibula. There is no evidence of osteomyelitis identified today. No soft tissue emphysema -At this point in regards to the ankle fracture he can start some light exercise on there the bike.  Discussed that there is any increase in swelling or pain to stop.   He has not been wearing a Cam boot for the last several weeks and he has been doing some walking without the boot in a regular shoe without any problems.  We will start some light exercise at this point. -I did debride the wound on the dorsal aspect the left foot to any complications or bleeding to remove several loose tissue.  Continue with antibiotic ointment and a dressing daily. -Monitor for any clinical signs or symptoms of infection and directed to call the office immediately should any occur or go to the ER. -RTC 2 weeks or sooner if needed.  *xray left ankle next appointment  Trula Slade DPM

## 2017-12-28 NOTE — Patient Instructions (Addendum)
Pre visit review using our clinic review tool, if applicable. No additional management support is needed unless otherwise documented below in the visit note.  Hold coumadin today and tomorrow(3/19 and 3/20) and then continue to take 1 tablet daily except 1 1/2 tablets on Wednesdays/Saturday.  Re-check in 1 week. Instructions given to patient.  Dialysis Pt.  Patient has been called with dosing instructions.  Pt verbalizes understanding.

## 2017-12-28 NOTE — Progress Notes (Signed)
Medical treatment/procedure(s) were performed by non-physician practitioner and as supervising physician I was immediately available for consultation/collaboration. I agree with above. Bradly Sangiovanni A Dyna Figuereo, MD  

## 2017-12-30 DIAGNOSIS — I82492 Acute embolism and thrombosis of other specified deep vein of left lower extremity: Secondary | ICD-10-CM | POA: Diagnosis not present

## 2017-12-30 DIAGNOSIS — E1122 Type 2 diabetes mellitus with diabetic chronic kidney disease: Secondary | ICD-10-CM | POA: Diagnosis not present

## 2017-12-30 DIAGNOSIS — N186 End stage renal disease: Secondary | ICD-10-CM | POA: Diagnosis not present

## 2017-12-30 DIAGNOSIS — N2581 Secondary hyperparathyroidism of renal origin: Secondary | ICD-10-CM | POA: Diagnosis not present

## 2017-12-30 DIAGNOSIS — E1129 Type 2 diabetes mellitus with other diabetic kidney complication: Secondary | ICD-10-CM | POA: Diagnosis not present

## 2017-12-30 DIAGNOSIS — E8770 Fluid overload, unspecified: Secondary | ICD-10-CM | POA: Diagnosis not present

## 2017-12-30 LAB — PROTIME-INR: INR: 3.1 — AB (ref <=1.1)

## 2018-01-01 DIAGNOSIS — N2581 Secondary hyperparathyroidism of renal origin: Secondary | ICD-10-CM | POA: Diagnosis not present

## 2018-01-01 DIAGNOSIS — N186 End stage renal disease: Secondary | ICD-10-CM | POA: Diagnosis not present

## 2018-01-01 DIAGNOSIS — E1122 Type 2 diabetes mellitus with diabetic chronic kidney disease: Secondary | ICD-10-CM | POA: Diagnosis not present

## 2018-01-01 DIAGNOSIS — E1129 Type 2 diabetes mellitus with other diabetic kidney complication: Secondary | ICD-10-CM | POA: Diagnosis not present

## 2018-01-01 DIAGNOSIS — E8770 Fluid overload, unspecified: Secondary | ICD-10-CM | POA: Diagnosis not present

## 2018-01-04 ENCOUNTER — Ambulatory Visit (INDEPENDENT_AMBULATORY_CARE_PROVIDER_SITE_OTHER): Payer: Medicare Other | Admitting: General Practice

## 2018-01-04 DIAGNOSIS — Z7901 Long term (current) use of anticoagulants: Secondary | ICD-10-CM | POA: Diagnosis not present

## 2018-01-04 DIAGNOSIS — E1129 Type 2 diabetes mellitus with other diabetic kidney complication: Secondary | ICD-10-CM | POA: Diagnosis not present

## 2018-01-04 DIAGNOSIS — E1122 Type 2 diabetes mellitus with diabetic chronic kidney disease: Secondary | ICD-10-CM | POA: Diagnosis not present

## 2018-01-04 DIAGNOSIS — E8770 Fluid overload, unspecified: Secondary | ICD-10-CM | POA: Diagnosis not present

## 2018-01-04 DIAGNOSIS — N2581 Secondary hyperparathyroidism of renal origin: Secondary | ICD-10-CM | POA: Diagnosis not present

## 2018-01-04 DIAGNOSIS — N186 End stage renal disease: Secondary | ICD-10-CM | POA: Diagnosis not present

## 2018-01-04 NOTE — Progress Notes (Signed)
Medical treatment/procedure(s) were performed by non-physician practitioner and as supervising physician I was immediately available for consultation/collaboration. I agree with above. Mark Hassey A Channah Godeaux, MD  

## 2018-01-04 NOTE — Patient Instructions (Signed)
Pre visit review using our clinic review tool, if applicable. No additional management support is needed unless otherwise documented below in the visit note.  Hold today and then continue to take 1 tablet daily except 1 1/2 tablets on Wednesdays/Saturday.  Re-check in 1 week. Instructions given to patient.  Dialysis Pt.  Patient has been called with dosing instructions.  Pt verbalizes understanding.

## 2018-01-06 DIAGNOSIS — E8770 Fluid overload, unspecified: Secondary | ICD-10-CM | POA: Diagnosis not present

## 2018-01-06 DIAGNOSIS — E1129 Type 2 diabetes mellitus with other diabetic kidney complication: Secondary | ICD-10-CM | POA: Diagnosis not present

## 2018-01-06 DIAGNOSIS — N2581 Secondary hyperparathyroidism of renal origin: Secondary | ICD-10-CM | POA: Diagnosis not present

## 2018-01-06 DIAGNOSIS — I82492 Acute embolism and thrombosis of other specified deep vein of left lower extremity: Secondary | ICD-10-CM | POA: Diagnosis not present

## 2018-01-06 DIAGNOSIS — M109 Gout, unspecified: Secondary | ICD-10-CM | POA: Diagnosis not present

## 2018-01-06 DIAGNOSIS — N186 End stage renal disease: Secondary | ICD-10-CM | POA: Diagnosis not present

## 2018-01-06 DIAGNOSIS — E1122 Type 2 diabetes mellitus with diabetic chronic kidney disease: Secondary | ICD-10-CM | POA: Diagnosis not present

## 2018-01-07 ENCOUNTER — Ambulatory Visit (INDEPENDENT_AMBULATORY_CARE_PROVIDER_SITE_OTHER): Payer: Medicare Other | Admitting: Podiatry

## 2018-01-07 ENCOUNTER — Ambulatory Visit (INDEPENDENT_AMBULATORY_CARE_PROVIDER_SITE_OTHER): Payer: Medicare Other

## 2018-01-07 DIAGNOSIS — S82892D Other fracture of left lower leg, subsequent encounter for closed fracture with routine healing: Secondary | ICD-10-CM | POA: Diagnosis not present

## 2018-01-07 DIAGNOSIS — L97501 Non-pressure chronic ulcer of other part of unspecified foot limited to breakdown of skin: Secondary | ICD-10-CM

## 2018-01-07 NOTE — Progress Notes (Signed)
Subjective: [name] [name] presents the office today for follow-up evaluation of left ankle fracture as well as wound to the top of the left foot. He states that he is doing well.  He denies any drainage or pus coming from the wound he states the ankle is feeling well.  He has not been wearing a brace or boot.  He has not gone back to exercise.  No recent injury or falls.  No swelling.  No other concerns No acute changes since last appointment, and no other complaints at this time.   Objective: AAO x3, NAD DP/PT pulses decreased bilaterally, CRT less than 3 seconds To the dorsal aspect midfoot appears to be a scab overlying the area the previous ulcer.It is starting to have some epidermal lysis along the periphery not able to debride some of this today.  The wound today measures 1 x 1 cm .  There is no surrounding erythema, ascending sialitis.  There is no fluctuation or crepitation.  There is no malodor.  There is no clinical signs of infection present.  There is no tenderness palpation of the ankle joint there is no edema, erythema to this area.  There is mild clicking sensation with ankle joint range of motion otherwise ankle joint range of motion intact. No other areas of tenderness identified bilaterally. No open lesions or other pre-ulcerative lesions.  No pain with calf compression, swelling, warmth, erythema  Assessment: Left dorsal foot ulceration, left ankle fracture  Plan: -All treatment options discussed with the patient including all alternatives, risks, complications.  -Sharply debrided the area on the left dorsal foot 2 without any complications to healthy tissue to remove some of the loose hyperkeratotic tissue.  Otherwise her left the central aspect of the scalp intact.  Continue with antibiotic ointment dressing changes daily.  Monitor for any signs or symptoms of infection. -X-rays were obtained reviewed with the ankle.  Vessel constipation is present.  There is callus formation on the  fracture site of the fibula.  There is evidence of healing to the area but there is still radial lucent line evident. -Regards the ankle he has not been immobilized.  He can start gentle exercise.  Has any increasing pain or any swelling to stop and let me know. -Follow-up in 2 weeks or sooner if needed.  Call any questions or concerns.  Trula Slade DPM

## 2018-01-08 DIAGNOSIS — E8770 Fluid overload, unspecified: Secondary | ICD-10-CM | POA: Diagnosis not present

## 2018-01-08 DIAGNOSIS — E1122 Type 2 diabetes mellitus with diabetic chronic kidney disease: Secondary | ICD-10-CM | POA: Diagnosis not present

## 2018-01-08 DIAGNOSIS — N186 End stage renal disease: Secondary | ICD-10-CM | POA: Diagnosis not present

## 2018-01-08 DIAGNOSIS — E1129 Type 2 diabetes mellitus with other diabetic kidney complication: Secondary | ICD-10-CM | POA: Diagnosis not present

## 2018-01-08 DIAGNOSIS — N2581 Secondary hyperparathyroidism of renal origin: Secondary | ICD-10-CM | POA: Diagnosis not present

## 2018-01-10 DIAGNOSIS — E1122 Type 2 diabetes mellitus with diabetic chronic kidney disease: Secondary | ICD-10-CM | POA: Diagnosis not present

## 2018-01-10 DIAGNOSIS — N186 End stage renal disease: Secondary | ICD-10-CM | POA: Diagnosis not present

## 2018-01-10 DIAGNOSIS — Z992 Dependence on renal dialysis: Secondary | ICD-10-CM | POA: Diagnosis not present

## 2018-01-11 DIAGNOSIS — N186 End stage renal disease: Secondary | ICD-10-CM | POA: Diagnosis not present

## 2018-01-11 DIAGNOSIS — E1122 Type 2 diabetes mellitus with diabetic chronic kidney disease: Secondary | ICD-10-CM | POA: Diagnosis not present

## 2018-01-11 DIAGNOSIS — N2581 Secondary hyperparathyroidism of renal origin: Secondary | ICD-10-CM | POA: Diagnosis not present

## 2018-01-11 DIAGNOSIS — E8770 Fluid overload, unspecified: Secondary | ICD-10-CM | POA: Diagnosis not present

## 2018-01-11 DIAGNOSIS — E1129 Type 2 diabetes mellitus with other diabetic kidney complication: Secondary | ICD-10-CM | POA: Diagnosis not present

## 2018-01-13 DIAGNOSIS — E1129 Type 2 diabetes mellitus with other diabetic kidney complication: Secondary | ICD-10-CM | POA: Diagnosis not present

## 2018-01-13 DIAGNOSIS — E1122 Type 2 diabetes mellitus with diabetic chronic kidney disease: Secondary | ICD-10-CM | POA: Diagnosis not present

## 2018-01-13 DIAGNOSIS — N186 End stage renal disease: Secondary | ICD-10-CM | POA: Diagnosis not present

## 2018-01-13 DIAGNOSIS — E8770 Fluid overload, unspecified: Secondary | ICD-10-CM | POA: Diagnosis not present

## 2018-01-13 DIAGNOSIS — I82492 Acute embolism and thrombosis of other specified deep vein of left lower extremity: Secondary | ICD-10-CM | POA: Diagnosis not present

## 2018-01-13 DIAGNOSIS — N2581 Secondary hyperparathyroidism of renal origin: Secondary | ICD-10-CM | POA: Diagnosis not present

## 2018-01-15 DIAGNOSIS — E1122 Type 2 diabetes mellitus with diabetic chronic kidney disease: Secondary | ICD-10-CM | POA: Diagnosis not present

## 2018-01-15 DIAGNOSIS — N186 End stage renal disease: Secondary | ICD-10-CM | POA: Diagnosis not present

## 2018-01-15 DIAGNOSIS — N2581 Secondary hyperparathyroidism of renal origin: Secondary | ICD-10-CM | POA: Diagnosis not present

## 2018-01-15 DIAGNOSIS — E1129 Type 2 diabetes mellitus with other diabetic kidney complication: Secondary | ICD-10-CM | POA: Diagnosis not present

## 2018-01-15 DIAGNOSIS — E8770 Fluid overload, unspecified: Secondary | ICD-10-CM | POA: Diagnosis not present

## 2018-01-17 DIAGNOSIS — N186 End stage renal disease: Secondary | ICD-10-CM | POA: Diagnosis not present

## 2018-01-17 DIAGNOSIS — E1129 Type 2 diabetes mellitus with other diabetic kidney complication: Secondary | ICD-10-CM | POA: Diagnosis not present

## 2018-01-17 DIAGNOSIS — E8770 Fluid overload, unspecified: Secondary | ICD-10-CM | POA: Diagnosis not present

## 2018-01-17 DIAGNOSIS — N2581 Secondary hyperparathyroidism of renal origin: Secondary | ICD-10-CM | POA: Diagnosis not present

## 2018-01-17 DIAGNOSIS — E1122 Type 2 diabetes mellitus with diabetic chronic kidney disease: Secondary | ICD-10-CM | POA: Diagnosis not present

## 2018-01-18 DIAGNOSIS — E1129 Type 2 diabetes mellitus with other diabetic kidney complication: Secondary | ICD-10-CM | POA: Diagnosis not present

## 2018-01-18 DIAGNOSIS — E1122 Type 2 diabetes mellitus with diabetic chronic kidney disease: Secondary | ICD-10-CM | POA: Diagnosis not present

## 2018-01-18 DIAGNOSIS — E8770 Fluid overload, unspecified: Secondary | ICD-10-CM | POA: Diagnosis not present

## 2018-01-18 DIAGNOSIS — N2581 Secondary hyperparathyroidism of renal origin: Secondary | ICD-10-CM | POA: Diagnosis not present

## 2018-01-18 DIAGNOSIS — N186 End stage renal disease: Secondary | ICD-10-CM | POA: Diagnosis not present

## 2018-01-20 DIAGNOSIS — I82492 Acute embolism and thrombosis of other specified deep vein of left lower extremity: Secondary | ICD-10-CM | POA: Diagnosis not present

## 2018-01-20 DIAGNOSIS — N186 End stage renal disease: Secondary | ICD-10-CM | POA: Diagnosis not present

## 2018-01-20 DIAGNOSIS — N2581 Secondary hyperparathyroidism of renal origin: Secondary | ICD-10-CM | POA: Diagnosis not present

## 2018-01-20 DIAGNOSIS — E8770 Fluid overload, unspecified: Secondary | ICD-10-CM | POA: Diagnosis not present

## 2018-01-20 DIAGNOSIS — E1122 Type 2 diabetes mellitus with diabetic chronic kidney disease: Secondary | ICD-10-CM | POA: Diagnosis not present

## 2018-01-20 DIAGNOSIS — E1129 Type 2 diabetes mellitus with other diabetic kidney complication: Secondary | ICD-10-CM | POA: Diagnosis not present

## 2018-01-20 LAB — PROTIME-INR
INR: 1.4 — AB (ref <=1.1)
INR: 1.4 — AB (ref <=1.1)

## 2018-01-21 ENCOUNTER — Ambulatory Visit (INDEPENDENT_AMBULATORY_CARE_PROVIDER_SITE_OTHER): Payer: Medicare Other | Admitting: General Practice

## 2018-01-21 ENCOUNTER — Ambulatory Visit (INDEPENDENT_AMBULATORY_CARE_PROVIDER_SITE_OTHER): Payer: Medicare Other | Admitting: Podiatry

## 2018-01-21 ENCOUNTER — Encounter: Payer: Self-pay | Admitting: Podiatry

## 2018-01-21 DIAGNOSIS — I82502 Chronic embolism and thrombosis of unspecified deep veins of left lower extremity: Secondary | ICD-10-CM

## 2018-01-21 DIAGNOSIS — Z7901 Long term (current) use of anticoagulants: Secondary | ICD-10-CM | POA: Diagnosis not present

## 2018-01-21 DIAGNOSIS — L97501 Non-pressure chronic ulcer of other part of unspecified foot limited to breakdown of skin: Secondary | ICD-10-CM

## 2018-01-21 DIAGNOSIS — S82892D Other fracture of left lower leg, subsequent encounter for closed fracture with routine healing: Secondary | ICD-10-CM

## 2018-01-21 LAB — POCT INR: INR: 4.01

## 2018-01-21 NOTE — Patient Instructions (Addendum)
Pre visit review using our clinic review tool, if applicable. No additional management support is needed unless otherwise documented below in the visit note.  Hold today and tomorrow and then continue to take 1 tablet daily except 1 1/2 tablets on Wednesdays/Saturday.  Re-check in 1 week. Instructions given to patient and to [place], RN @ [facility].  Both patient and RN verbalized understanding.  Neta Mends, [place]

## 2018-01-21 NOTE — Progress Notes (Signed)
Medical treatment/procedure(s) were performed by non-physician practitioner and as supervising physician I was immediately available for consultation/collaboration. I agree with above. Avonna Iribe A Detavious Rinn, MD  

## 2018-01-22 DIAGNOSIS — E8770 Fluid overload, unspecified: Secondary | ICD-10-CM | POA: Diagnosis not present

## 2018-01-22 DIAGNOSIS — E1122 Type 2 diabetes mellitus with diabetic chronic kidney disease: Secondary | ICD-10-CM | POA: Diagnosis not present

## 2018-01-22 DIAGNOSIS — N186 End stage renal disease: Secondary | ICD-10-CM | POA: Diagnosis not present

## 2018-01-22 DIAGNOSIS — E1129 Type 2 diabetes mellitus with other diabetic kidney complication: Secondary | ICD-10-CM | POA: Diagnosis not present

## 2018-01-22 DIAGNOSIS — N2581 Secondary hyperparathyroidism of renal origin: Secondary | ICD-10-CM | POA: Diagnosis not present

## 2018-01-24 ENCOUNTER — Telehealth: Payer: Self-pay | Admitting: *Deleted

## 2018-01-24 ENCOUNTER — Other Ambulatory Visit: Payer: Self-pay

## 2018-01-24 DIAGNOSIS — S82892G Other fracture of left lower leg, subsequent encounter for closed fracture with delayed healing: Secondary | ICD-10-CM

## 2018-01-24 NOTE — Telephone Encounter (Signed)
Dr. Jacqualyn Posey ordered evaluation and treatment of left ankle fracture hx.
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
## 2018-01-25 ENCOUNTER — Ambulatory Visit (INDEPENDENT_AMBULATORY_CARE_PROVIDER_SITE_OTHER): Payer: Medicare Other | Admitting: General Practice

## 2018-01-25 DIAGNOSIS — Z7901 Long term (current) use of anticoagulants: Secondary | ICD-10-CM

## 2018-01-25 DIAGNOSIS — E1122 Type 2 diabetes mellitus with diabetic chronic kidney disease: Secondary | ICD-10-CM | POA: Diagnosis not present

## 2018-01-25 DIAGNOSIS — N2581 Secondary hyperparathyroidism of renal origin: Secondary | ICD-10-CM | POA: Diagnosis not present

## 2018-01-25 DIAGNOSIS — N186 End stage renal disease: Secondary | ICD-10-CM | POA: Diagnosis not present

## 2018-01-25 DIAGNOSIS — E8770 Fluid overload, unspecified: Secondary | ICD-10-CM | POA: Diagnosis not present

## 2018-01-25 DIAGNOSIS — E1129 Type 2 diabetes mellitus with other diabetic kidney complication: Secondary | ICD-10-CM | POA: Diagnosis not present

## 2018-01-25 DIAGNOSIS — I82502 Chronic embolism and thrombosis of unspecified deep veins of left lower extremity: Secondary | ICD-10-CM | POA: Diagnosis not present

## 2018-01-25 LAB — PROTIME-INR: INR: 1.4 — AB (ref <=1.1)

## 2018-01-25 NOTE — Progress Notes (Signed)
Medical treatment/procedure(s) were performed by non-physician practitioner and as supervising physician I was immediately available for consultation/collaboration. I agree with above. Elizabeth A Crawford, MD  

## 2018-01-25 NOTE — Patient Instructions (Signed)
Pre visit review using our clinic review tool, if applicable. No additional management support is needed unless otherwise documented below in the visit note.  Take 1 1/2 tablets today (4/16) and take 2 tablets tomorrow (4/17) and then continue to take 1 tablet daily except 1 1/2 tablets on Wednesdays/Saturday.  Re-check in 1 week. Instructions given to patient and to [place], RN @ [place].  Both patient and RN verbalized understanding.  Call [place], RN @ [phone] Ext- [phone]

## 2018-01-26 ENCOUNTER — Telehealth: Payer: Self-pay

## 2018-01-26 ENCOUNTER — Telehealth: Payer: Self-pay | Admitting: Endocrinology

## 2018-01-26 DIAGNOSIS — M25572 Pain in left ankle and joints of left foot: Secondary | ICD-10-CM | POA: Diagnosis not present

## 2018-01-26 DIAGNOSIS — M25472 Effusion, left ankle: Secondary | ICD-10-CM | POA: Diagnosis not present

## 2018-01-26 NOTE — Telephone Encounter (Signed)
Spoke with pt and letter was faxed to DM after having MD name and address placed on it.

## 2018-01-26 NOTE — Telephone Encounter (Signed)
Patient is call on the status of fax sent to [facility], please adviese

## 2018-01-26 NOTE — Telephone Encounter (Signed)
Spoke with pt regarding paper that Dr. Dwyane Dee signed. Pt requested that name be printed and address be placed on sheet per mandate by DMV. Dr. Dwyane Dee approved this and it was done and faxed to number given by patient. Number is [phone]. Pt was called and notified and pt verbalized understanding.

## 2018-01-27 ENCOUNTER — Ambulatory Visit: Payer: Self-pay | Admitting: Endocrinology

## 2018-01-27 DIAGNOSIS — E1122 Type 2 diabetes mellitus with diabetic chronic kidney disease: Secondary | ICD-10-CM | POA: Diagnosis not present

## 2018-01-27 DIAGNOSIS — E8770 Fluid overload, unspecified: Secondary | ICD-10-CM | POA: Diagnosis not present

## 2018-01-27 DIAGNOSIS — I82492 Acute embolism and thrombosis of other specified deep vein of left lower extremity: Secondary | ICD-10-CM | POA: Diagnosis not present

## 2018-01-27 DIAGNOSIS — E1129 Type 2 diabetes mellitus with other diabetic kidney complication: Secondary | ICD-10-CM | POA: Diagnosis not present

## 2018-01-27 DIAGNOSIS — N2581 Secondary hyperparathyroidism of renal origin: Secondary | ICD-10-CM | POA: Diagnosis not present

## 2018-01-27 DIAGNOSIS — N186 End stage renal disease: Secondary | ICD-10-CM | POA: Diagnosis not present

## 2018-01-28 ENCOUNTER — Ambulatory Visit: Payer: Self-pay | Admitting: Endocrinology

## 2018-01-28 DIAGNOSIS — M25572 Pain in left ankle and joints of left foot: Secondary | ICD-10-CM | POA: Diagnosis not present

## 2018-01-28 DIAGNOSIS — E119 Type 2 diabetes mellitus without complications: Secondary | ICD-10-CM | POA: Diagnosis not present

## 2018-01-28 DIAGNOSIS — M6281 Muscle weakness (generalized): Secondary | ICD-10-CM | POA: Diagnosis not present

## 2018-01-28 DIAGNOSIS — M25472 Effusion, left ankle: Secondary | ICD-10-CM | POA: Diagnosis not present

## 2018-01-29 DIAGNOSIS — E8770 Fluid overload, unspecified: Secondary | ICD-10-CM | POA: Diagnosis not present

## 2018-01-29 DIAGNOSIS — E1122 Type 2 diabetes mellitus with diabetic chronic kidney disease: Secondary | ICD-10-CM | POA: Diagnosis not present

## 2018-01-29 DIAGNOSIS — N2581 Secondary hyperparathyroidism of renal origin: Secondary | ICD-10-CM | POA: Diagnosis not present

## 2018-01-29 DIAGNOSIS — N186 End stage renal disease: Secondary | ICD-10-CM | POA: Diagnosis not present

## 2018-01-29 DIAGNOSIS — E1129 Type 2 diabetes mellitus with other diabetic kidney complication: Secondary | ICD-10-CM | POA: Diagnosis not present

## 2018-01-29 LAB — PROTIME-INR: INR: 3.1 — AB (ref <=1.1)

## 2018-02-01 ENCOUNTER — Ambulatory Visit (INDEPENDENT_AMBULATORY_CARE_PROVIDER_SITE_OTHER): Payer: Medicare Other | Admitting: General Practice

## 2018-02-01 DIAGNOSIS — E1129 Type 2 diabetes mellitus with other diabetic kidney complication: Secondary | ICD-10-CM | POA: Diagnosis not present

## 2018-02-01 DIAGNOSIS — E1122 Type 2 diabetes mellitus with diabetic chronic kidney disease: Secondary | ICD-10-CM | POA: Diagnosis not present

## 2018-02-01 DIAGNOSIS — Z7901 Long term (current) use of anticoagulants: Secondary | ICD-10-CM

## 2018-02-01 DIAGNOSIS — N186 End stage renal disease: Secondary | ICD-10-CM | POA: Diagnosis not present

## 2018-02-01 DIAGNOSIS — E8770 Fluid overload, unspecified: Secondary | ICD-10-CM | POA: Diagnosis not present

## 2018-02-01 DIAGNOSIS — N2581 Secondary hyperparathyroidism of renal origin: Secondary | ICD-10-CM | POA: Diagnosis not present

## 2018-02-01 NOTE — Patient Instructions (Addendum)
Pre visit review using our clinic review tool, if applicable. No additional management support is needed unless otherwise documented below in the visit note.  Continue to take 1 tablet daily except 1 1/2 tablets on Wednesdays/Saturday.  Re-check in 1 week. Instructions given to patient and to [place], RN @ [place].  Both patient and RN verbalized understanding.  Call [place], RN @ [phone] Ext- [phone]

## 2018-02-01 NOTE — Progress Notes (Signed)
Medical treatment/procedure(s) were performed by non-physician practitioner and as supervising physician I was immediately available for consultation/collaboration. I agree with above. Taimane Stimmel A Ellanore Vanhook, MD  

## 2018-02-02 DIAGNOSIS — E119 Type 2 diabetes mellitus without complications: Secondary | ICD-10-CM | POA: Diagnosis not present

## 2018-02-02 DIAGNOSIS — M25572 Pain in left ankle and joints of left foot: Secondary | ICD-10-CM | POA: Diagnosis not present

## 2018-02-02 DIAGNOSIS — M6281 Muscle weakness (generalized): Secondary | ICD-10-CM | POA: Diagnosis not present

## 2018-02-02 DIAGNOSIS — M25472 Effusion, left ankle: Secondary | ICD-10-CM | POA: Diagnosis not present

## 2018-02-03 DIAGNOSIS — E1122 Type 2 diabetes mellitus with diabetic chronic kidney disease: Secondary | ICD-10-CM | POA: Diagnosis not present

## 2018-02-03 DIAGNOSIS — M109 Gout, unspecified: Secondary | ICD-10-CM | POA: Diagnosis not present

## 2018-02-03 DIAGNOSIS — I82492 Acute embolism and thrombosis of other specified deep vein of left lower extremity: Secondary | ICD-10-CM | POA: Diagnosis not present

## 2018-02-03 DIAGNOSIS — N186 End stage renal disease: Secondary | ICD-10-CM | POA: Diagnosis not present

## 2018-02-03 DIAGNOSIS — E8770 Fluid overload, unspecified: Secondary | ICD-10-CM | POA: Diagnosis not present

## 2018-02-03 DIAGNOSIS — N2581 Secondary hyperparathyroidism of renal origin: Secondary | ICD-10-CM | POA: Diagnosis not present

## 2018-02-03 DIAGNOSIS — E1129 Type 2 diabetes mellitus with other diabetic kidney complication: Secondary | ICD-10-CM | POA: Diagnosis not present

## 2018-02-03 LAB — PROTIME-INR: INR: 3.8 — AB (ref <=1.1)

## 2018-02-04 ENCOUNTER — Ambulatory Visit (INDEPENDENT_AMBULATORY_CARE_PROVIDER_SITE_OTHER): Payer: Medicare Other | Admitting: General Practice

## 2018-02-04 ENCOUNTER — Ambulatory Visit: Payer: Medicare Other | Admitting: Podiatry

## 2018-02-04 DIAGNOSIS — Z7901 Long term (current) use of anticoagulants: Secondary | ICD-10-CM | POA: Diagnosis not present

## 2018-02-04 NOTE — Patient Instructions (Signed)
Pre visit review using our clinic review tool, if applicable. No additional management support is needed unless otherwise documented below in the visit note.  Skip dosage today (4/26) and then change dosage and take 1 tablet daily except 1 1/2 tablets on Wednesdays.  Re-check in 1 week. Instructions given to patient and to [place], RN @ [place].  Both patient and RN verbalized understanding.  Call [place], RN @ [phone] Ext- [phone]

## 2018-02-04 NOTE — Progress Notes (Signed)
Medical treatment/procedure(s) were performed by non-physician practitioner and as supervising physician I was immediately available for consultation/collaboration. I agree with above. Arrick Dutton A Cloris Flippo, MD  

## 2018-02-05 DIAGNOSIS — N186 End stage renal disease: Secondary | ICD-10-CM | POA: Diagnosis not present

## 2018-02-05 DIAGNOSIS — E1129 Type 2 diabetes mellitus with other diabetic kidney complication: Secondary | ICD-10-CM | POA: Diagnosis not present

## 2018-02-05 DIAGNOSIS — E1122 Type 2 diabetes mellitus with diabetic chronic kidney disease: Secondary | ICD-10-CM | POA: Diagnosis not present

## 2018-02-05 DIAGNOSIS — E8770 Fluid overload, unspecified: Secondary | ICD-10-CM | POA: Diagnosis not present

## 2018-02-05 DIAGNOSIS — N2581 Secondary hyperparathyroidism of renal origin: Secondary | ICD-10-CM | POA: Diagnosis not present

## 2018-02-08 ENCOUNTER — Encounter: Payer: Self-pay | Admitting: Cardiovascular Disease

## 2018-02-08 DIAGNOSIS — N186 End stage renal disease: Secondary | ICD-10-CM | POA: Diagnosis not present

## 2018-02-08 DIAGNOSIS — E1129 Type 2 diabetes mellitus with other diabetic kidney complication: Secondary | ICD-10-CM | POA: Diagnosis not present

## 2018-02-08 DIAGNOSIS — E8770 Fluid overload, unspecified: Secondary | ICD-10-CM | POA: Diagnosis not present

## 2018-02-08 DIAGNOSIS — N2581 Secondary hyperparathyroidism of renal origin: Secondary | ICD-10-CM | POA: Diagnosis not present

## 2018-02-08 DIAGNOSIS — E1122 Type 2 diabetes mellitus with diabetic chronic kidney disease: Secondary | ICD-10-CM | POA: Diagnosis not present

## 2018-02-09 DIAGNOSIS — N186 End stage renal disease: Secondary | ICD-10-CM | POA: Diagnosis not present

## 2018-02-09 DIAGNOSIS — E1122 Type 2 diabetes mellitus with diabetic chronic kidney disease: Secondary | ICD-10-CM | POA: Diagnosis not present

## 2018-02-09 DIAGNOSIS — Z992 Dependence on renal dialysis: Secondary | ICD-10-CM | POA: Diagnosis not present

## 2018-02-10 DIAGNOSIS — N2581 Secondary hyperparathyroidism of renal origin: Secondary | ICD-10-CM | POA: Diagnosis not present

## 2018-02-10 DIAGNOSIS — E8770 Fluid overload, unspecified: Secondary | ICD-10-CM | POA: Diagnosis not present

## 2018-02-10 DIAGNOSIS — I82492 Acute embolism and thrombosis of other specified deep vein of left lower extremity: Secondary | ICD-10-CM | POA: Diagnosis not present

## 2018-02-10 DIAGNOSIS — N186 End stage renal disease: Secondary | ICD-10-CM | POA: Diagnosis not present

## 2018-02-10 DIAGNOSIS — D631 Anemia in chronic kidney disease: Secondary | ICD-10-CM | POA: Diagnosis not present

## 2018-02-10 DIAGNOSIS — E1122 Type 2 diabetes mellitus with diabetic chronic kidney disease: Secondary | ICD-10-CM | POA: Diagnosis not present

## 2018-02-10 LAB — POCT INR: INR: 2.62

## 2018-02-11 ENCOUNTER — Encounter: Payer: Self-pay | Admitting: Podiatry

## 2018-02-11 ENCOUNTER — Ambulatory Visit (INDEPENDENT_AMBULATORY_CARE_PROVIDER_SITE_OTHER): Payer: Medicare Other | Admitting: Podiatry

## 2018-02-11 DIAGNOSIS — S82892G Other fracture of left lower leg, subsequent encounter for closed fracture with delayed healing: Secondary | ICD-10-CM

## 2018-02-11 DIAGNOSIS — L97501 Non-pressure chronic ulcer of other part of unspecified foot limited to breakdown of skin: Secondary | ICD-10-CM | POA: Diagnosis not present

## 2018-02-11 DIAGNOSIS — M79675 Pain in left toe(s): Secondary | ICD-10-CM

## 2018-02-11 DIAGNOSIS — M79674 Pain in right toe(s): Secondary | ICD-10-CM | POA: Diagnosis not present

## 2018-02-11 DIAGNOSIS — B351 Tinea unguium: Secondary | ICD-10-CM | POA: Diagnosis not present

## 2018-02-12 DIAGNOSIS — E8770 Fluid overload, unspecified: Secondary | ICD-10-CM | POA: Diagnosis not present

## 2018-02-12 DIAGNOSIS — E1122 Type 2 diabetes mellitus with diabetic chronic kidney disease: Secondary | ICD-10-CM | POA: Diagnosis not present

## 2018-02-12 DIAGNOSIS — D631 Anemia in chronic kidney disease: Secondary | ICD-10-CM | POA: Diagnosis not present

## 2018-02-12 DIAGNOSIS — N2581 Secondary hyperparathyroidism of renal origin: Secondary | ICD-10-CM | POA: Diagnosis not present

## 2018-02-12 DIAGNOSIS — N186 End stage renal disease: Secondary | ICD-10-CM | POA: Diagnosis not present

## 2018-02-13 NOTE — Progress Notes (Signed)
Subjective: [name] [name] presents the office today for follow-up evaluation of left ankle fracture as well as wound to the top of the left foot.  He states he been doing well and is been going to therapy.  He feels that the wound on top of the left foot is healed.  He also has concerns in his right big toenail has become somewhat loose and dark in color.  The rest of his toenails have become thick and discolored as well and cause irritation inside of his shoes.  Denies any redness or drainage of the toenail sites.  He has no other concerns today.  Denies any drainage or pus or any redness or swelling he has no new concerns today.  No recent injury or trauma.  Objective: AAO x3, NAD DP/PT pulses decreased bilaterally, CRT less than 3 seconds To the dorsal aspect midfoot appears to be a scab overlying the area the previous ulcer.  Upon debridement there is new pink skin present there is no underlying ulceration appears that the wound is healed.  There is no surrounding erythema, ascending sialitis.  There is no fluctuation or crepitation and there is no malodor.  There is no other open lesions.  The nails are hypertrophic, dystrophic, discolored with ill-defined discoloration of the right hallux toenail is loose from the underlying nail bed and there is old subungual hematoma present.  There is no extension of any hyperpigmentation of the surrounding skin.  There is no pain or restriction or crepitation with ankle joint range of motion.  There is minimal swelling.  No other areas of tenderness identified bilaterally. No pain with calf compression, swelling, warmth, erythema  Assessment: Left dorsal foot ulceration, left ankle fracture; symptomatic onychomycosis right subungual hematoma  Plan: -All treatment options discussed with the patient including all alternatives, risks, complications.  -I debrided the scab on the dorsal aspect the left foot to the any complications and appears that the wound is  healed. He can still use a small Band-Aid to the area to help protect it. -Ankle appears to be doing well.  Continue therapy. -Nails debrided x10 without any complications or bleeding.  Is able to remove almost the entirety of the right hallux toenail as it was loose but there is no signs of infection or underlying ulceration.  No bleeding occurred during debridement. -Follow-up in 6 weeks or sooner if needed.  Trula Slade DPM

## 2018-02-14 ENCOUNTER — Telehealth: Payer: Self-pay | Admitting: Neurology

## 2018-02-14 NOTE — Telephone Encounter (Signed)
Please call pt at [phone]. Pt is needing a appt asap for this week, states he can not wait later, also is wanting a letter of clearance.

## 2018-02-15 ENCOUNTER — Ambulatory Visit (INDEPENDENT_AMBULATORY_CARE_PROVIDER_SITE_OTHER): Payer: Medicare Other | Admitting: General Practice

## 2018-02-15 DIAGNOSIS — E1122 Type 2 diabetes mellitus with diabetic chronic kidney disease: Secondary | ICD-10-CM | POA: Diagnosis not present

## 2018-02-15 DIAGNOSIS — Z7901 Long term (current) use of anticoagulants: Secondary | ICD-10-CM

## 2018-02-15 DIAGNOSIS — E8770 Fluid overload, unspecified: Secondary | ICD-10-CM | POA: Diagnosis not present

## 2018-02-15 DIAGNOSIS — N186 End stage renal disease: Secondary | ICD-10-CM | POA: Diagnosis not present

## 2018-02-15 DIAGNOSIS — N2581 Secondary hyperparathyroidism of renal origin: Secondary | ICD-10-CM | POA: Diagnosis not present

## 2018-02-15 DIAGNOSIS — D631 Anemia in chronic kidney disease: Secondary | ICD-10-CM | POA: Diagnosis not present

## 2018-02-15 NOTE — Telephone Encounter (Signed)
Tried calling pt again. Had to LVM for him to call back

## 2018-02-15 NOTE — Telephone Encounter (Signed)
Tried patient once more. Was able to speak with him. Offered this Thursday at 02/17/18 at 9am but he declined stating he has dialysis every Tuesday/Thursday from 5am-12pm.  I offered 03/09/18 but he initially declined stating he needed appt asap because he needed DMV forms filled out by 02/17/18. I advised there are no other openings right now with MD. I can schedule him for 03/09/18 at 730am and if anyone cancels sooner than that, I can call him. He was agreeable to this. He will also try and contact DMV to ask for extension and also speak with PCP to see if they can help.

## 2018-02-15 NOTE — Telephone Encounter (Signed)
Pt last saw MM,NP 04/22/15 and was instructed to f/u in 6 months. Pt has not had a f/u since.

## 2018-02-15 NOTE — Telephone Encounter (Signed)
Called, LVM for pt to call our office. Dr. Jannifer Franklin does not have any openings this week unless someone cancels.   Wanted to get more information as to why he needed appt and what clearance he needed/what this was for.

## 2018-02-15 NOTE — Patient Instructions (Signed)
Pre visit review using our clinic review tool, if applicable. No additional management support is needed unless otherwise documented below in the visit note. 

## 2018-02-16 NOTE — Telephone Encounter (Signed)
Called pt. Dr. Jannifer Franklin had cx on 02/17/18 at 330pm. He was agreeable to date/time. I scheduled him for appt. Advised him to bring insurance cards, copay and updated med list. He verbalized understanding and appreciation for call.

## 2018-02-17 ENCOUNTER — Encounter: Payer: Self-pay | Admitting: Neurology

## 2018-02-17 ENCOUNTER — Ambulatory Visit (INDEPENDENT_AMBULATORY_CARE_PROVIDER_SITE_OTHER): Payer: Medicare Other | Admitting: Neurology

## 2018-02-17 VITALS — BP 82/43 | HR 94 | Ht 70.0 in | Wt 303.0 lb

## 2018-02-17 DIAGNOSIS — N2581 Secondary hyperparathyroidism of renal origin: Secondary | ICD-10-CM | POA: Diagnosis not present

## 2018-02-17 DIAGNOSIS — E8770 Fluid overload, unspecified: Secondary | ICD-10-CM | POA: Diagnosis not present

## 2018-02-17 DIAGNOSIS — E1142 Type 2 diabetes mellitus with diabetic polyneuropathy: Secondary | ICD-10-CM | POA: Diagnosis not present

## 2018-02-17 DIAGNOSIS — N186 End stage renal disease: Secondary | ICD-10-CM | POA: Diagnosis not present

## 2018-02-17 DIAGNOSIS — I82492 Acute embolism and thrombosis of other specified deep vein of left lower extremity: Secondary | ICD-10-CM | POA: Diagnosis not present

## 2018-02-17 DIAGNOSIS — D631 Anemia in chronic kidney disease: Secondary | ICD-10-CM | POA: Diagnosis not present

## 2018-02-17 DIAGNOSIS — E1122 Type 2 diabetes mellitus with diabetic chronic kidney disease: Secondary | ICD-10-CM | POA: Diagnosis not present

## 2018-02-17 DIAGNOSIS — M109 Gout, unspecified: Secondary | ICD-10-CM | POA: Diagnosis not present

## 2018-02-17 NOTE — Progress Notes (Signed)
Reason for visit: Peripheral neuropathy  [name] [name] is an 52 y.o. male  History of present illness:  [name] [name] is a 52 year old right-handed gentleman with a history of diabetes with severe diabetic peripheral neuropathy.  The patient has bilateral foot drops, left greater than right.  He comes in today mainly as he needs a letter sent out to [facility] to allow him to continue driving.  The patient operates a standard motor vehicle, the has never had a motor vehicle accident he claims.  He does have numbness in the feet but he has no problems discerning whether or not he is pressing the accelerator or the brake.  He drives short distances in town, he does not go on the highway.  The patient has gait instability, he has not had any recent falls.  He uses a scooter to get around, he may also use a walker.  He is in some physical therapy working on strengthening the legs.  The patient is on hemodialysis.  The patient claims that he sleeps well at night, he does not have excessive drowsiness during the day.  He has not had any recent falls.  He returns to the office today for an evaluation.  He does not have a lot of discomfort in the legs or feet.  Past Medical History:  Diagnosis Date  . Allergic rhinitis   . Anemia   . Aortic insufficiency    a. Echo 7/16:  Mod LVH, EF 50-55%, mild to mod AI, severe LAE, PASP 57 mmHg (LV ID end diastolic 76.7 mm);  b. Echo 2/17:  Mod LVH, EF 50-55%, mod AI, MAC, mild MR, mod LAE, mild to mod TR, PASP 63 mmHg  . Bilateral carpal tunnel syndrome   . CHF (congestive heart failure) ([place])   . Chronic kidney disease    Kidney Transplant 2009 09/26/15- transplanated kidney failing  . Chronic kidney disease (CKD), stage IV (severe) ([place])   . Chronic renal allograft nephropathy   . Claustrophobia   . Complication of anesthesia    "they said something at [facility] it was affected by sleep aqpnea."  . COPD (chronic obstructive pulmonary disease) ([place])   .  Diabetes mellitus    Type II  . Diabetic peripheral neuropathy ([place])   . Diabetic retinopathy ([place])   . Diminished eyesight   . Discitis of lumbar region    resolved  . DVT (deep venous thrombosis) ([place]) 10/07/2015   LLE  . Dyslipidemia   . Endocarditis   . Endocarditis 11/28/14  . Gait disorder   . Gastroparesis   . GERD (gastroesophageal reflux disease)   . Gout   . Hearing difficulty   . Hyperlipidemia   . Hypertension   . Incomplete bladder emptying   . Leg pain 02/05/11   with walking  . Morbid obesity ([place])   . Nervousness(799.21)   . Obstructive sleep apnea    not using CPAP- lost 100lbs  . Osteomyelitis ([place])   . Posttraumatic stress disorder   . Renal transplant, status post 04/05/2014  . Rotator cuff disorder   . Secondary hyperparathyroidism ([place])   . Sepsis ([place])    Postive blood cultures -   . Shortness of breath    when lying flat  . Urethral stricture   . Vitamin D deficiency   . Weight gain     Past Surgical History:  Procedure Laterality Date  . ARTERIOVENOUS GRAFT PLACEMENT Bilateral "several"  . AV FISTULA PLACEMENT Bilateral 2015  .  BASCILIC VEIN TRANSPOSITION Right 09/27/2015  . [place] Right 09/27/2015   Procedure: BASILIC VEIN TRANSPOSITION right;  Surgeon: Mal Misty, MD;  Location: [place];  Service: Vascular;  Laterality: Right;  . CATARACT EXTRACTION W/ INTRAOCULAR LENS  IMPLANT, BILATERAL Bilateral   . CYSTOSCOPY W/ INTERNAL URETHROTOMY  09/2014  . KIDNEY TRANSPLANT  2009    Family History  Problem Relation Age of Onset  . Hypertension Mother   . Diabetes Father     Social history:  reports that he has never smoked. He has never used smokeless tobacco. He reports that he does not drink alcohol or use drugs.    Allergies  Allergen Reactions  . Pork-Derived Products Anaphylaxis and Other (See Comments)    Fever also   . Ace Inhibitors Cough  . Hydrocodone Other (See Comments)    Hallucinations     Medications:  Prior to Admission medications   Medication Sig Start Date End Date Taking? Authorizing Provider  calcium acetate (PHOSLO) 667 MG capsule Take 3 capsules (2,001 mg total) by mouth 3 (three) times daily with meals. 10/26/15  Yes Ghimire, Henreitta Leber, MD  cephALEXin (KEFLEX) 500 MG capsule Take 1 capsule (500 mg total) by mouth 2 (two) times daily. 10/15/17  Yes Trula Slade, DPM  clonazePAM (KLONOPIN) 1 MG tablet  12/24/16  Yes [provider]  cloNIDine (CATAPRES) 0.3 MG tablet TAKE 1 TABLET THREE TIMES DAILY 07/14/16  Yes Hoyt Koch, MD  Febuxostat (ULORIC) 80 MG TABS Take 1 tablet (80 mg total) by mouth daily. 04/07/16  Yes Hoyt Koch, MD  glucose blood (ACCU-CHEK GUIDE) test strip Check blood sugar four times daily 09/02/16  Yes Elayne Snare, MD  glucose blood (ACCU-CHEK SMARTVIEW) test strip USE FOUR TIMES DAILY AS DIRECTED TO CHECK BLOOD SUGAR 11/24/17  Yes Elayne Snare, MD  hydrocortisone cream 1 % Apply to affected area 2 times daily 11/29/16  Yes Plunkett, Loree Fee, MD  Insulin Glargine (TOUJEO SOLOSTAR) 300 UNIT/ML SOPN Inject 70-80 Units into the skin 2 (two) times daily. Inject 80 units in the morning and inject 70 units in the evening. 11/24/17  Yes Elayne Snare, MD  Insulin Pen Needle (BD PEN NEEDLE NANO U/F) 32G X 4 MM MISC USE  TO INJECT FIVE TIMES DAILY AS DIRECTED 11/24/17  Yes Elayne Snare, MD  Insulin Pen Needle 32G X 8 MM MISC Use as directed 10/26/15  Yes Ghimire, Henreitta Leber, MD  insulin regular human CONCENTRATED (HUMULIN R U-500 KWIKPEN) 500 UNIT/ML kwikpen INJECT SUBCUTANEOUSLY 15 UNITS AT BREAKFAST, 25 UNITS AT LUNCH AND 25-30 UNITS BEFORE SUPPER 11/24/17  Yes Elayne Snare, MD  labetalol (NORMODYNE) 100 MG tablet TAKE 1 TABLET THREE TIMES DAILY 03/20/16  Yes Hoyt Koch, MD  Lancets (ACCU-CHEK MULTICLIX) lancets Check blood sugar four times daily 09/02/16  Yes Elayne Snare, MD  metoprolol succinate (TOPROL-XL) 25 MG 24 hr tablet  Take 1 tablet (25 mg total) by mouth daily. 05/26/17  Yes Elayne Snare, MD  midodrine (PROAMATINE) 10 MG tablet Take 10 mg by mouth as needed (low blood pressure).  01/08/17  Yes [provider]  ofloxacin (FLOXIN) 0.3 % OTIC solution Place 10 drops into the right ear daily. For a total of 7 days 05/19/17  Yes Rosemarie Ax, MD  omeprazole (PRILOSEC) 20 MG capsule TAKE 1 CAPSULE EVERY DAY 07/05/17  Yes Hoyt Koch, MD  RENVELA 800 MG tablet Take 2,400 mg by mouth 3 (three) times daily with meals.  02/02/17  Yes [provider]  rosuvastatin (CRESTOR) 20 MG tablet TAKE 1 TABLET AT BEDTIME 12/20/17  Yes Hoyt Koch, MD  silver sulfADIAZINE (SILVADENE) 1 % cream Apply 1 application topically daily. 08/09/17  Yes Trula Slade, DPM  silver sulfADIAZINE (SILVADENE) 1 % cream Apply 1 application topically daily. 10/15/17  Yes Trula Slade, DPM  traMADol (ULTRAM) 50 MG tablet Take 1 tablet (50 mg total) by mouth every 8 (eight) hours as needed. 12/02/15  Yes Hoyt Koch, MD  VELTASSA 8.4 g packet Take 8.4 g by mouth daily.  12/30/16  Yes [provider]  warfarin (COUMADIN) 5 MG tablet Take 1 (5 mg) tablet every day except for Wednesday & Saturday when you take 7.69m (1.5 tablets) OR As directed. 07/05/17  Yes CHoyt Koch MD    ROS:  Out of a complete 14 system review of symptoms, the patient complains only of the following symptoms, and all other reviewed systems are negative.  Gait disorder Leg weakness  Blood pressure (!) 82/43, pulse 94, height 5' 10"  (1.778 m), weight (!) 303 lb (137.4 kg).  Physical Exam  General: The patient is alert and cooperative at the time of the examination.  The patient is markedly obese.  Skin: 1+ edema at the ankles is seen bilaterally.   Neurologic Exam  Mental status: The patient is alert and oriented x 3 at the time of the examination. The patient has apparent normal recent and remote  memory, with an apparently normal attention span and concentration ability.   Cranial nerves: Facial symmetry is present. Speech is normal, no aphasia or dysarthria is noted. Extraocular movements are full. Visual fields are full.  Motor: The patient has good strength in all 4 extremities, with exception that there is a mild right foot drop and a prominent left foot drop.  Sensory examination: Soft touch sensation is symmetric on the face, arms, and legs.  The patient has a stocking pattern pinprick sensory deficit in the distal third of the legs bilaterally.  Position sense is impaired in both feet, severely in the left foot, moderately in the right.  Position sense is relatively normal in both arms.  Coordination: The patient has good finger-nose-finger and heel-to-shin bilaterally.  Gait and station: The patient has a wide-based unsteady gait.  The patient can walk with assistance.  Tandem gait was not attempted.  Reflexes: Deep tendon reflexes are symmetric, but are depressed.   Assessment/Plan:  1.  Severe diabetic peripheral neuropathy  2.  Gait disorder  The patient indicates that he has good sensation of the feet that allows him to discern between the accelerator and brake pedal.  He does appear to have better position sense in the right foot than the left.  I will dictate a letter indicating that he may continue driving at this point.  At some point, he may need to convert to hand controls to continue driving.  He will follow-up if needed.  He is not getting any medications through this office.  CJill AlexandersMD 02/17/2018 4:01 PM  [facility] [address]STooneGSag Harbor [place] [address] Phone [phone] [phone]

## 2018-02-17 NOTE — Progress Notes (Signed)
Placed letter in mail to pt as requested per CW,MD re: driving clearance for DMV.

## 2018-02-19 DIAGNOSIS — N2581 Secondary hyperparathyroidism of renal origin: Secondary | ICD-10-CM | POA: Diagnosis not present

## 2018-02-19 DIAGNOSIS — D631 Anemia in chronic kidney disease: Secondary | ICD-10-CM | POA: Diagnosis not present

## 2018-02-19 DIAGNOSIS — E8770 Fluid overload, unspecified: Secondary | ICD-10-CM | POA: Diagnosis not present

## 2018-02-19 DIAGNOSIS — N186 End stage renal disease: Secondary | ICD-10-CM | POA: Diagnosis not present

## 2018-02-19 DIAGNOSIS — E1122 Type 2 diabetes mellitus with diabetic chronic kidney disease: Secondary | ICD-10-CM | POA: Diagnosis not present

## 2018-02-22 DIAGNOSIS — N186 End stage renal disease: Secondary | ICD-10-CM | POA: Diagnosis not present

## 2018-02-22 DIAGNOSIS — E1122 Type 2 diabetes mellitus with diabetic chronic kidney disease: Secondary | ICD-10-CM | POA: Diagnosis not present

## 2018-02-22 DIAGNOSIS — E8770 Fluid overload, unspecified: Secondary | ICD-10-CM | POA: Diagnosis not present

## 2018-02-22 DIAGNOSIS — D631 Anemia in chronic kidney disease: Secondary | ICD-10-CM | POA: Diagnosis not present

## 2018-02-22 DIAGNOSIS — N2581 Secondary hyperparathyroidism of renal origin: Secondary | ICD-10-CM | POA: Diagnosis not present

## 2018-02-23 ENCOUNTER — Ambulatory Visit (HOSPITAL_COMMUNITY): Payer: Medicare Other | Attending: Cardiovascular Disease

## 2018-02-23 ENCOUNTER — Other Ambulatory Visit: Payer: Self-pay

## 2018-02-23 DIAGNOSIS — N186 End stage renal disease: Secondary | ICD-10-CM | POA: Insufficient documentation

## 2018-02-23 DIAGNOSIS — I132 Hypertensive heart and chronic kidney disease with heart failure and with stage 5 chronic kidney disease, or end stage renal disease: Secondary | ICD-10-CM | POA: Diagnosis not present

## 2018-02-23 DIAGNOSIS — E785 Hyperlipidemia, unspecified: Secondary | ICD-10-CM | POA: Insufficient documentation

## 2018-02-23 DIAGNOSIS — E8770 Fluid overload, unspecified: Secondary | ICD-10-CM | POA: Diagnosis not present

## 2018-02-23 DIAGNOSIS — Z6841 Body Mass Index (BMI) 40.0 and over, adult: Secondary | ICD-10-CM | POA: Insufficient documentation

## 2018-02-23 DIAGNOSIS — G4733 Obstructive sleep apnea (adult) (pediatric): Secondary | ICD-10-CM | POA: Insufficient documentation

## 2018-02-23 DIAGNOSIS — I351 Nonrheumatic aortic (valve) insufficiency: Secondary | ICD-10-CM | POA: Diagnosis not present

## 2018-02-23 DIAGNOSIS — D631 Anemia in chronic kidney disease: Secondary | ICD-10-CM | POA: Diagnosis not present

## 2018-02-23 DIAGNOSIS — Z940 Kidney transplant status: Secondary | ICD-10-CM | POA: Diagnosis not present

## 2018-02-23 DIAGNOSIS — I509 Heart failure, unspecified: Secondary | ICD-10-CM | POA: Diagnosis not present

## 2018-02-23 DIAGNOSIS — E1122 Type 2 diabetes mellitus with diabetic chronic kidney disease: Secondary | ICD-10-CM | POA: Diagnosis not present

## 2018-02-23 DIAGNOSIS — N2581 Secondary hyperparathyroidism of renal origin: Secondary | ICD-10-CM | POA: Diagnosis not present

## 2018-02-24 DIAGNOSIS — I82492 Acute embolism and thrombosis of other specified deep vein of left lower extremity: Secondary | ICD-10-CM | POA: Diagnosis not present

## 2018-02-24 DIAGNOSIS — N186 End stage renal disease: Secondary | ICD-10-CM | POA: Diagnosis not present

## 2018-02-24 DIAGNOSIS — E1122 Type 2 diabetes mellitus with diabetic chronic kidney disease: Secondary | ICD-10-CM | POA: Diagnosis not present

## 2018-02-24 DIAGNOSIS — D631 Anemia in chronic kidney disease: Secondary | ICD-10-CM | POA: Diagnosis not present

## 2018-02-24 DIAGNOSIS — E8770 Fluid overload, unspecified: Secondary | ICD-10-CM | POA: Diagnosis not present

## 2018-02-24 DIAGNOSIS — N2581 Secondary hyperparathyroidism of renal origin: Secondary | ICD-10-CM | POA: Diagnosis not present

## 2018-02-25 ENCOUNTER — Other Ambulatory Visit: Payer: Self-pay | Admitting: Endocrinology

## 2018-02-25 LAB — POCT INR: INR: 3.4 — AB (ref 2.0–3.0)

## 2018-02-26 DIAGNOSIS — E1122 Type 2 diabetes mellitus with diabetic chronic kidney disease: Secondary | ICD-10-CM | POA: Diagnosis not present

## 2018-02-26 DIAGNOSIS — N2581 Secondary hyperparathyroidism of renal origin: Secondary | ICD-10-CM | POA: Diagnosis not present

## 2018-02-26 DIAGNOSIS — N186 End stage renal disease: Secondary | ICD-10-CM | POA: Diagnosis not present

## 2018-02-26 DIAGNOSIS — D631 Anemia in chronic kidney disease: Secondary | ICD-10-CM | POA: Diagnosis not present

## 2018-02-26 DIAGNOSIS — E8770 Fluid overload, unspecified: Secondary | ICD-10-CM | POA: Diagnosis not present

## 2018-02-28 ENCOUNTER — Ambulatory Visit (INDEPENDENT_AMBULATORY_CARE_PROVIDER_SITE_OTHER): Payer: Medicare Other | Admitting: Internal Medicine

## 2018-02-28 ENCOUNTER — Encounter: Payer: Self-pay | Admitting: Cardiovascular Disease

## 2018-02-28 ENCOUNTER — Encounter: Payer: Self-pay | Admitting: Internal Medicine

## 2018-02-28 ENCOUNTER — Other Ambulatory Visit: Payer: Self-pay

## 2018-02-28 ENCOUNTER — Ambulatory Visit (INDEPENDENT_AMBULATORY_CARE_PROVIDER_SITE_OTHER): Payer: Medicare Other | Admitting: General Practice

## 2018-02-28 ENCOUNTER — Ambulatory Visit (INDEPENDENT_AMBULATORY_CARE_PROVIDER_SITE_OTHER): Payer: Medicare Other | Admitting: Cardiovascular Disease

## 2018-02-28 ENCOUNTER — Telehealth: Payer: Self-pay | Admitting: Endocrinology

## 2018-02-28 ENCOUNTER — Telehealth: Payer: Self-pay

## 2018-02-28 VITALS — BP 114/60 | HR 68 | Ht 70.0 in | Wt 309.0 lb

## 2018-02-28 DIAGNOSIS — I5032 Chronic diastolic (congestive) heart failure: Secondary | ICD-10-CM | POA: Diagnosis not present

## 2018-02-28 DIAGNOSIS — I110 Hypertensive heart disease with heart failure: Secondary | ICD-10-CM | POA: Diagnosis not present

## 2018-02-28 DIAGNOSIS — Z7901 Long term (current) use of anticoagulants: Secondary | ICD-10-CM | POA: Diagnosis not present

## 2018-02-28 DIAGNOSIS — I351 Nonrheumatic aortic (valve) insufficiency: Secondary | ICD-10-CM | POA: Diagnosis not present

## 2018-02-28 DIAGNOSIS — I82502 Chronic embolism and thrombosis of unspecified deep veins of left lower extremity: Secondary | ICD-10-CM

## 2018-02-28 DIAGNOSIS — K649 Unspecified hemorrhoids: Secondary | ICD-10-CM | POA: Insufficient documentation

## 2018-02-28 DIAGNOSIS — I471 Supraventricular tachycardia: Secondary | ICD-10-CM

## 2018-02-28 MED ORDER — HYDROCORTISONE ACETATE 25 MG RE SUPP: 25.0000 mg | Freq: Two times a day (BID) | RECTAL | 0 refills | Status: DC

## 2018-02-28 MED ORDER — INSULIN REGULAR HUMAN (CONC) 500 UNIT/ML ~~LOC~~ SOPN: PEN_INJECTOR | SUBCUTANEOUS | 0 refills | Status: DC

## 2018-02-28 NOTE — Telephone Encounter (Signed)
Copied from [place] [id number]. Topic: General - Other >> Feb 28, 2018  2:15 PM Yvette Rack wrote: Reason for CRM: patient states that the insurance want pay for the hydrocortisone (ANUSOL-HC) 25 MG suppository  He would need something else pharmacy faxed over a request around 12 today

## 2018-02-28 NOTE — Telephone Encounter (Signed)
Medication sent to pharmacy  

## 2018-02-28 NOTE — Progress Notes (Signed)
   Subjective:    Patient ID: [name] [name], male    DOB: [birth year], 52 y.o.   MRN: [id number]  HPI The patient is a 52 YO man coming in for problems with hemorrhoids. Started about 1 week ago. He does have intermittent constipation with diarrhea. Lately he is having some loose stools. Denies abdominal pain, nausea or vomiting. Gets burning after defecating. Lasts for several hours. Denies fevers or chills. He has tried over the counter products which have not helped much. He is worried that maybe he has eaten too many spicy foods lately including lots of tomatoes or peppers. Denies blood in stool or with wiping. No itching.   Review of Systems  Constitutional: Negative.   Respiratory: Negative for cough, chest tightness and shortness of breath.   Cardiovascular: Negative for chest pain, palpitations and leg swelling.  Gastrointestinal: Positive for rectal pain. Negative for abdominal distention, abdominal pain, anal bleeding, blood in stool, constipation, diarrhea, nausea and vomiting.  Musculoskeletal: Negative.   Skin: Negative.   Neurological: Negative.       Objective:   Physical Exam  Constitutional: He is oriented to person, place, and time. He appears well-developed and well-nourished.  Obese  HENT:  Head: Normocephalic and atraumatic.  Eyes: EOM are normal.  Neck: Normal range of motion.  Cardiovascular: Normal rate and regular rhythm.  Pulmonary/Chest: Effort normal and breath sounds normal. No respiratory distress. He has no wheezes. He has no rales.  Abdominal: Soft. Bowel sounds are normal. He exhibits no distension. There is no tenderness. There is no rebound.  Musculoskeletal: He exhibits no edema.  Neurological: He is alert and oriented to person, place, and time. Coordination abnormal.  Motorized scooter.   Skin: Skin is warm and dry.  Psychiatric: He has a normal mood and affect.   Vitals:   02/28/18 0816  BP: (!) 110/50  Pulse: 67  Temp: 97.8 F (36.6 C)    TempSrc: Oral  SpO2: 97%  Weight: (!) 309 lb (140.2 kg)  Height: 5\' 10"  (1.778 m)      Assessment & Plan:

## 2018-02-28 NOTE — Telephone Encounter (Signed)
insulin regular human CONCENTRATED (HUMULIN R U-500 KWIKPEN) 500 UNIT/ML kwikpen   Patient stated that the pharmacy was sending over a refill request to put office for insulin    [place], [name] [name]

## 2018-02-28 NOTE — Assessment & Plan Note (Signed)
Rx for hydrocortisone suppositories to use. Call back if not improved.

## 2018-02-28 NOTE — Patient Instructions (Signed)

## 2018-02-28 NOTE — Progress Notes (Signed)
Chief Complaint  Patient presents with  . Follow-up    Aortic insufficiency   History of Present Illness: 52 yo [name] male with history of aortic valve insufficiency, DM, HTN, HLD, morbid obesity, ESRD on HD, prior endocarditis and DVT who is here today for cardiac follow up. He is s/p failed kidney transpant in 2009 at [facility] in [place]. I saw him for the first time in 2011 for evaluation of chest pain. He has chronic leg weakness and swelling and is mostly immobile. Echo in 2011 showed moderate LVH with normal LV systolic function. Since then he has had evidence of aortic valve insufficiency. He was admitted to [facility] and then [facility] in 2016 with discitis and he had evidence of aortic valve endocarditis. The plan was for antibiotics and then surgery but he did not tolerate his cardiac cath and no surgery was performed. He missed all f/u appts at [facility] with the cardiology division. He did see ID at [facility] and blood cultures normalized. Most recent echo May 2019 with severe LVH, LVEF 48%, grade 3 diastolic dysfunction, mild AI. He has a prior DVT and has been on coumadin.    He is here today for follow up. The patient denies any chest pain, dyspnea, palpitations, lower extremity edema, orthopnea, PND, dizziness, near syncope or syncope.    Primary Care Physician: Hoyt Koch, MD Nephrology: Dr. Jamal Maes   Past Medical History:  Diagnosis Date  . Allergic rhinitis   . Anemia   . Aortic insufficiency    a. Echo 7/16:  Mod LVH, EF 50-55%, mild to mod AI, severe LAE, PASP 57 mmHg (LV ID end diastolic 54.6 mm);  b. Echo 2/17:  Mod LVH, EF 50-55%, mod AI, MAC, mild MR, mod LAE, mild to mod TR, PASP 63 mmHg  . Bilateral carpal tunnel syndrome   . CHF (congestive heart failure) ([place])   . Chronic kidney disease    Kidney Transplant 2009 09/26/15- transplanated kidney failing  . Chronic kidney disease (CKD), stage IV (severe) ([place])   . Chronic renal  allograft nephropathy   . Claustrophobia   . Complication of anesthesia    "they said something at [facility] it was affected by sleep aqpnea."  . COPD (chronic obstructive pulmonary disease) ([place])   . Diabetes mellitus    Type II  . Diabetic peripheral neuropathy ([place])   . Diabetic retinopathy ([place])   . Diminished eyesight   . Discitis of lumbar region    resolved  . DVT (deep venous thrombosis) ([place]) 10/07/2015   LLE  . Dyslipidemia   . Endocarditis   . Endocarditis 11/28/14  . Gait disorder   . Gastroparesis   . GERD (gastroesophageal reflux disease)   . Gout   . Hearing difficulty   . Hyperlipidemia   . Hypertension   . Incomplete bladder emptying   . Leg pain 02/05/11   with walking  . Morbid obesity ([place])   . Nervousness(799.21)   . Obstructive sleep apnea    not using CPAP- lost 100lbs  . Osteomyelitis ([place])   . Posttraumatic stress disorder   . Renal transplant, status post 04/05/2014  . Rotator cuff disorder   . Secondary hyperparathyroidism ([place])   . Sepsis ([place])    Postive blood cultures -   . Shortness of breath    when lying flat  . Urethral stricture   . Vitamin D deficiency   . Weight gain     Past Surgical History:  Procedure Laterality Date  . ARTERIOVENOUS GRAFT PLACEMENT Bilateral "several"  . AV FISTULA PLACEMENT Bilateral 2015  . BASCILIC VEIN TRANSPOSITION Right 09/27/2015  . [place] Right 09/27/2015   Procedure: BASILIC VEIN TRANSPOSITION right;  Surgeon: Mal Misty, MD;  Location: [place];  Service: Vascular;  Laterality: Right;  . CATARACT EXTRACTION W/ INTRAOCULAR LENS  IMPLANT, BILATERAL Bilateral   . CYSTOSCOPY W/ INTERNAL URETHROTOMY  09/2014  . KIDNEY TRANSPLANT  2009    Current Outpatient Medications  Medication Sig Dispense Refill  . calcium acetate (PHOSLO) 667 MG capsule Take 3 capsules (2,001 mg total) by mouth 3 (three) times daily with meals. 270 capsule 0  . clonazePAM (KLONOPIN) 1 MG tablet     .  Febuxostat (ULORIC) 80 MG TABS Take 1 tablet (80 mg total) by mouth daily. 90 tablet 2  . glucose blood (ACCU-CHEK GUIDE) test strip Check blood sugar four times daily 200 each 12  . glucose blood (ACCU-CHEK SMARTVIEW) test strip USE FOUR TIMES DAILY AS DIRECTED TO CHECK BLOOD SUGAR 400 each 0  . hydrocortisone (ANUSOL-HC) 25 MG suppository Place 1 suppository (25 mg total) rectally 2 (two) times daily. 24 suppository 0  . hydrocortisone cream 1 % Apply to affected area 2 times daily 15 g 0  . Insulin Glargine (TOUJEO SOLOSTAR) 300 UNIT/ML SOPN Inject 70-80 Units into the skin 2 (two) times daily. Inject 80 units in the morning and inject 70 units in the evening. 270 mL 2  . Insulin Pen Needle (BD PEN NEEDLE NANO U/F) 32G X 4 MM MISC USE  TO INJECT FIVE TIMES DAILY AS DIRECTED 450 each 1  . Insulin Pen Needle 32G X 8 MM MISC Use as directed 100 each 0  . insulin regular human CONCENTRATED (HUMULIN R U-500 KWIKPEN) 500 UNIT/ML kwikpen INJECT SUBCUTANEOUSLY 15 UNITS AT BREAKFAST, 25 UNITS AT LUNCH AND 25-30 UNITS BEFORE SUPPER 18 mL 0  . Lancets (ACCU-CHEK MULTICLIX) lancets Check blood sugar four times daily 200 each 12  . midodrine (PROAMATINE) 10 MG tablet Take 10 mg by mouth as needed (low blood pressure).     Marland Kitchen omeprazole (PRILOSEC) 20 MG capsule TAKE 1 CAPSULE EVERY DAY 90 capsule 2  . RENVELA 800 MG tablet Take 2,400 mg by mouth 3 (three) times daily with meals.     . rosuvastatin (CRESTOR) 20 MG tablet TAKE 1 TABLET AT BEDTIME 90 tablet 0  . traMADol (ULTRAM) 50 MG tablet Take 1 tablet (50 mg total) by mouth every 8 (eight) hours as needed. 30 tablet 0  . VELTASSA 8.4 g packet Take 8.4 g by mouth daily.     Marland Kitchen warfarin (COUMADIN) 5 MG tablet Take 1 (5 mg) tablet every day except for Wednesday & Saturday when you take 7.16m (1.5 tablets) OR As directed. 105 tablet 0   No current facility-administered medications for this visit.     Allergies  Allergen Reactions  . Pork-Derived Products  Anaphylaxis and Other (See Comments)    Fever also   . Ace Inhibitors Cough  . Hydrocodone Other (See Comments)    Hallucinations    Social History   Socioeconomic History  . Marital status: Divorced    Spouse name: Not on file  . Number of children: 0  . Years of education: College   . Highest education level: Not on file  Occupational History    Employer: UNEMPLOYED  Social Needs  . Financial resource strain: Not on file  . Food insecurity:  Worry: Not on file    Inability: Not on file  . Transportation needs:    Medical: Not on file    Non-medical: Not on file  Tobacco Use  . Smoking status: Never Smoker  . Smokeless tobacco: Never Used  Substance and Sexual Activity  . Alcohol use: No    Alcohol/week: 0.0 oz  . Drug use: No  . Sexual activity: Not Currently  Lifestyle  . Physical activity:    Days per week: Not on file    Minutes per session: Not on file  . Stress: Not on file  Relationships  . Social connections:    Talks on phone: Not on file    Gets together: Not on file    Attends religious service: Not on file    Active member of club or organization: Not on file    Attends meetings of clubs or organizations: Not on file    Relationship status: Not on file  . Intimate partner violence:    Fear of current or ex partner: Not on file    Emotionally abused: Not on file    Physically abused: Not on file    Forced sexual activity: Not on file  Other Topics Concern  . Not on file  Social History Narrative   Patient is Divorced.   Patient does not have any children.   Patient is right-handed   Patient is currently in college working on his Ph.D.       Family History  Problem Relation Age of Onset  . Hypertension Mother   . Diabetes Father     Review of Systems:  As stated in the HPI and otherwise negative.   BP 114/60   Pulse 68   Ht 5' 10"  (1.778 m)   Wt (!) 309 lb (140.2 kg)   SpO2 98%   BMI 44.34 kg/m   Physical  Examination:  General: Well developed, well nourished, NAD  HEENT: OP clear, mucus membranes moist  SKIN: warm, dry. No rashes. Neuro: No focal deficits  Musculoskeletal: Muscle strength 5/5 all ext  Psychiatric: Mood and affect normal  Neck: No JVD, no carotid bruits, no thyromegaly, no lymphadenopathy.  Lungs:Clear bilaterally, no wheezes, rhonci, crackles Cardiovascular: Regular rate and rhythm. No murmurs, gallops or rubs. Abdomen:Soft. Bowel sounds present. Non-tender.  Extremities: No lower extremity edema. Pulses are 2 + in the bilateral DP/PT.  Echo May 2019:  ventricle: The cavity size was normal. Wall thickness was   increased in a pattern of severe LVH. Systolic function was   normal. The estimated ejection fraction was in the range of 60%   to 65%. Wall motion was normal; there were no regional wall   motion abnormalities. Doppler parameters are consistent with a   reversible restrictive pattern, indicative of decreased left   ventricular diastolic compliance and/or increased left atrial   pressure (grade 3 diastolic dysfunction). - Aortic valve: There was mild regurgitation.  EKG:  EKG is not ordered today. The ekg ordered today demonstrates   Recent Labs: No results found for requested labs within last 8760 hours.    Wt Readings from Last 3 Encounters:  02/28/18 (!) 309 lb (140.2 kg)  02/28/18 (!) 309 lb (140.2 kg)  02/17/18 (!) 303 lb (137.4 kg)     Other studies Reviewed: Additional studies/ records that were reviewed today include: . Review of the above records demonstrates:    Assessment and Plan:   1. Aortic insufficiency: Patient was noted to have severe aortic  insufficiency at the time of TEE in February 2016 during his hospitalization for enterococcal bacteremia at [facility]. Surgery was recommended, however, he declined and failed to follow-up with the surgeons at [facility]. Echo May 2019 with mild AI. Will follow for now. Repeat echo one year.    2.  Hypertensive heart disease with heart failure : BP is controlled. No changes.   3. Morbid obesity: Wt loss is recommended  4. Chronic diastolic CHF: Volume controlled with dialysis.   5. HLD: Continue statin.   6. End stage renal disease: He is on HD.  7.  Atrial tachycardia: No recent palpitations. Continue beta blocker.   Current medicines are reviewed at length with the patient today.  The patient does not have concerns regarding medicines.  The following changes have been made:  no change  Labs/ tests ordered today include:   No orders of the defined types were placed in this encounter.    Disposition:   FU with me in 12 months   Signed, Lauree Chandler, MD 02/28/2018 9:53 AM    [facility] [place], [place], [place]  [address] Phone: [phone]; Fax: [phone]

## 2018-02-28 NOTE — Patient Instructions (Addendum)
Pre visit review using our clinic review tool, if applicable. No additional management support is needed unless otherwise documented below in the visit note.  Hold coumadin today and then start taking 5 mg daily.   Re-check in 1 week. Instructions given to patient and to [place], RN @ [place].  Both patient and RN verbalized understanding.  Call [place], RN @ [phone] Ext- [phone]

## 2018-02-28 NOTE — Patient Instructions (Signed)
Try the tucks pads to wipe or witch hazel pads to wipe to help with the pain.  We have sent in suppositories to use twice a day for 2 weeks if needed to help get rid of the pain.

## 2018-02-28 NOTE — Telephone Encounter (Signed)
Patient informed that I will start a PA tomorrow for the medication and if he does hear back on the mean time about an alternative that is covered he will call back and let us know so we can send it in

## 2018-03-01 DIAGNOSIS — D631 Anemia in chronic kidney disease: Secondary | ICD-10-CM | POA: Diagnosis not present

## 2018-03-01 DIAGNOSIS — E1122 Type 2 diabetes mellitus with diabetic chronic kidney disease: Secondary | ICD-10-CM | POA: Diagnosis not present

## 2018-03-01 DIAGNOSIS — N186 End stage renal disease: Secondary | ICD-10-CM | POA: Diagnosis not present

## 2018-03-01 DIAGNOSIS — E8770 Fluid overload, unspecified: Secondary | ICD-10-CM | POA: Diagnosis not present

## 2018-03-01 DIAGNOSIS — N2581 Secondary hyperparathyroidism of renal origin: Secondary | ICD-10-CM | POA: Diagnosis not present

## 2018-03-03 DIAGNOSIS — D631 Anemia in chronic kidney disease: Secondary | ICD-10-CM | POA: Diagnosis not present

## 2018-03-03 DIAGNOSIS — N2581 Secondary hyperparathyroidism of renal origin: Secondary | ICD-10-CM | POA: Diagnosis not present

## 2018-03-03 DIAGNOSIS — E8770 Fluid overload, unspecified: Secondary | ICD-10-CM | POA: Diagnosis not present

## 2018-03-03 DIAGNOSIS — I82492 Acute embolism and thrombosis of other specified deep vein of left lower extremity: Secondary | ICD-10-CM | POA: Diagnosis not present

## 2018-03-03 DIAGNOSIS — E1122 Type 2 diabetes mellitus with diabetic chronic kidney disease: Secondary | ICD-10-CM | POA: Diagnosis not present

## 2018-03-03 DIAGNOSIS — N186 End stage renal disease: Secondary | ICD-10-CM | POA: Diagnosis not present

## 2018-03-03 LAB — PROTIME-INR: INR: 1.9 — AB (ref <=1.1)

## 2018-03-05 DIAGNOSIS — N2581 Secondary hyperparathyroidism of renal origin: Secondary | ICD-10-CM | POA: Diagnosis not present

## 2018-03-05 DIAGNOSIS — E8770 Fluid overload, unspecified: Secondary | ICD-10-CM | POA: Diagnosis not present

## 2018-03-05 DIAGNOSIS — D631 Anemia in chronic kidney disease: Secondary | ICD-10-CM | POA: Diagnosis not present

## 2018-03-05 DIAGNOSIS — E1122 Type 2 diabetes mellitus with diabetic chronic kidney disease: Secondary | ICD-10-CM | POA: Diagnosis not present

## 2018-03-05 DIAGNOSIS — N186 End stage renal disease: Secondary | ICD-10-CM | POA: Diagnosis not present

## 2018-03-08 ENCOUNTER — Ambulatory Visit (INDEPENDENT_AMBULATORY_CARE_PROVIDER_SITE_OTHER): Payer: Medicare Other | Admitting: General Practice

## 2018-03-08 DIAGNOSIS — Z7901 Long term (current) use of anticoagulants: Secondary | ICD-10-CM

## 2018-03-08 DIAGNOSIS — N2581 Secondary hyperparathyroidism of renal origin: Secondary | ICD-10-CM | POA: Diagnosis not present

## 2018-03-08 DIAGNOSIS — N186 End stage renal disease: Secondary | ICD-10-CM | POA: Diagnosis not present

## 2018-03-08 DIAGNOSIS — E1122 Type 2 diabetes mellitus with diabetic chronic kidney disease: Secondary | ICD-10-CM | POA: Diagnosis not present

## 2018-03-08 DIAGNOSIS — E8770 Fluid overload, unspecified: Secondary | ICD-10-CM | POA: Diagnosis not present

## 2018-03-08 DIAGNOSIS — D631 Anemia in chronic kidney disease: Secondary | ICD-10-CM | POA: Diagnosis not present

## 2018-03-08 NOTE — Patient Instructions (Signed)
Pre visit review using our clinic review tool, if applicable. No additional management support is needed unless otherwise documented below in the visit note.  Take 7.5 mg today and then continue to take 5 mg daily.   Re-check in 1 week. Instructions given to patient and to [place], RN @ [place].  Both patient and RN verbalized understanding.  Call [place], RN @ [phone] Ext- [phone]

## 2018-03-09 ENCOUNTER — Ambulatory Visit: Payer: Self-pay | Admitting: Neurology

## 2018-03-09 NOTE — Progress Notes (Signed)
Medical treatment/procedure(s) were performed by non-physician practitioner and as supervising physician I was immediately available for consultation/collaboration. I agree with above. Elizabeth A Crawford, MD  

## 2018-03-10 DIAGNOSIS — I82492 Acute embolism and thrombosis of other specified deep vein of left lower extremity: Secondary | ICD-10-CM | POA: Diagnosis not present

## 2018-03-10 DIAGNOSIS — D631 Anemia in chronic kidney disease: Secondary | ICD-10-CM | POA: Diagnosis not present

## 2018-03-10 DIAGNOSIS — N186 End stage renal disease: Secondary | ICD-10-CM | POA: Diagnosis not present

## 2018-03-10 DIAGNOSIS — E1122 Type 2 diabetes mellitus with diabetic chronic kidney disease: Secondary | ICD-10-CM | POA: Diagnosis not present

## 2018-03-10 DIAGNOSIS — E8770 Fluid overload, unspecified: Secondary | ICD-10-CM | POA: Diagnosis not present

## 2018-03-10 DIAGNOSIS — N2581 Secondary hyperparathyroidism of renal origin: Secondary | ICD-10-CM | POA: Diagnosis not present

## 2018-03-11 ENCOUNTER — Telehealth: Payer: Self-pay | Admitting: Internal Medicine

## 2018-03-11 NOTE — Telephone Encounter (Signed)
MD is out of the office will forward to assistant desktop until she return back in the office.Marland KitchenJohny Chess

## 2018-03-11 NOTE — Telephone Encounter (Signed)
Copied from [place] [phone]. Topic: Quick Communication - See Telephone Encounter >> Mar 11, 2018  3:28 PM Arletha Grippe wrote: CRM for notification. See Telephone encounter for: 03/11/18. Pt called - he needs substitute for hydrocortisone (ANUSOL-HC) 25 MG suppository - it is not covered by insurance.  [facility] [address]  Cb is [phone]

## 2018-03-12 DIAGNOSIS — N186 End stage renal disease: Secondary | ICD-10-CM | POA: Diagnosis not present

## 2018-03-12 DIAGNOSIS — N2581 Secondary hyperparathyroidism of renal origin: Secondary | ICD-10-CM | POA: Diagnosis not present

## 2018-03-12 DIAGNOSIS — Z992 Dependence on renal dialysis: Secondary | ICD-10-CM | POA: Diagnosis not present

## 2018-03-12 DIAGNOSIS — E875 Hyperkalemia: Secondary | ICD-10-CM | POA: Diagnosis not present

## 2018-03-12 DIAGNOSIS — E1122 Type 2 diabetes mellitus with diabetic chronic kidney disease: Secondary | ICD-10-CM | POA: Diagnosis not present

## 2018-03-14 ENCOUNTER — Other Ambulatory Visit: Payer: Self-pay | Admitting: Internal Medicine

## 2018-03-14 NOTE — Telephone Encounter (Signed)
Patient called back today and said that he has not heard anything about his medication being changed and would like a call back with an update of this. Please call patient back, thanks.

## 2018-03-15 DIAGNOSIS — N186 End stage renal disease: Secondary | ICD-10-CM | POA: Diagnosis not present

## 2018-03-15 DIAGNOSIS — E875 Hyperkalemia: Secondary | ICD-10-CM | POA: Diagnosis not present

## 2018-03-15 DIAGNOSIS — E1122 Type 2 diabetes mellitus with diabetic chronic kidney disease: Secondary | ICD-10-CM | POA: Diagnosis not present

## 2018-03-15 DIAGNOSIS — N2581 Secondary hyperparathyroidism of renal origin: Secondary | ICD-10-CM | POA: Diagnosis not present

## 2018-03-16 ENCOUNTER — Other Ambulatory Visit: Payer: Self-pay | Admitting: Family

## 2018-03-16 MED ORDER — HYDROCORTISONE ACETATE 25 MG RE SUPP: 25.0000 mg | Freq: Two times a day (BID) | RECTAL | 0 refills | Status: DC

## 2018-03-16 MED ORDER — HYDROCORTISONE ACE-PRAMOXINE 1-1 % RE FOAM: 1.0000 | Freq: Three times a day (TID) | RECTAL | 0 refills | Status: DC

## 2018-03-16 NOTE — Telephone Encounter (Signed)
I am sending in proctofoam (per laura murray,NP)---I will call [facility] to see if patient's insurance will cover this and then advise patient---can talk with tamara,RN at [place] office if needed  After talking with [facility]--patient's insurance will only cover 2.5% cream, will not cover any foams---rx has been changed to reflect cream --ok to use per laura murray,NP---patient states he has already tried cream and it's not working, he would like for original rx (foam) to be resent and he will pay for it out of pocket---[facility]--ok to resend orginial rx per laura murray,NP

## 2018-03-17 DIAGNOSIS — E875 Hyperkalemia: Secondary | ICD-10-CM | POA: Diagnosis not present

## 2018-03-17 DIAGNOSIS — I82492 Acute embolism and thrombosis of other specified deep vein of left lower extremity: Secondary | ICD-10-CM | POA: Diagnosis not present

## 2018-03-17 DIAGNOSIS — E1122 Type 2 diabetes mellitus with diabetic chronic kidney disease: Secondary | ICD-10-CM | POA: Diagnosis not present

## 2018-03-17 DIAGNOSIS — N186 End stage renal disease: Secondary | ICD-10-CM | POA: Diagnosis not present

## 2018-03-17 DIAGNOSIS — N2581 Secondary hyperparathyroidism of renal origin: Secondary | ICD-10-CM | POA: Diagnosis not present

## 2018-03-17 LAB — POCT INR: INR: 3.8 — AB (ref 2.0–3.0)

## 2018-03-19 DIAGNOSIS — E875 Hyperkalemia: Secondary | ICD-10-CM | POA: Diagnosis not present

## 2018-03-19 DIAGNOSIS — N186 End stage renal disease: Secondary | ICD-10-CM | POA: Diagnosis not present

## 2018-03-19 DIAGNOSIS — N2581 Secondary hyperparathyroidism of renal origin: Secondary | ICD-10-CM | POA: Diagnosis not present

## 2018-03-19 DIAGNOSIS — E1122 Type 2 diabetes mellitus with diabetic chronic kidney disease: Secondary | ICD-10-CM | POA: Diagnosis not present

## 2018-03-21 ENCOUNTER — Ambulatory Visit (INDEPENDENT_AMBULATORY_CARE_PROVIDER_SITE_OTHER): Payer: Medicare Other | Admitting: General Practice

## 2018-03-21 DIAGNOSIS — I82502 Chronic embolism and thrombosis of unspecified deep veins of left lower extremity: Secondary | ICD-10-CM | POA: Diagnosis not present

## 2018-03-21 DIAGNOSIS — Z7901 Long term (current) use of anticoagulants: Secondary | ICD-10-CM | POA: Diagnosis not present

## 2018-03-21 NOTE — Patient Instructions (Addendum)
Pre visit review using our clinic review tool, if applicable. No additional management support is needed unless otherwise documented below in the visit note.  Hold coumadin today (6/10 and then continue to take 1 tablet daily.   Re-check in 1 week. Instructions given to patient and to [place], RN @ [place].  Both patient and RN verbalized understanding.  Call [place], RN @ [phone] Ext- [phone]

## 2018-03-22 DIAGNOSIS — E875 Hyperkalemia: Secondary | ICD-10-CM | POA: Diagnosis not present

## 2018-03-22 DIAGNOSIS — E1122 Type 2 diabetes mellitus with diabetic chronic kidney disease: Secondary | ICD-10-CM | POA: Diagnosis not present

## 2018-03-22 DIAGNOSIS — N186 End stage renal disease: Secondary | ICD-10-CM | POA: Diagnosis not present

## 2018-03-22 DIAGNOSIS — N2581 Secondary hyperparathyroidism of renal origin: Secondary | ICD-10-CM | POA: Diagnosis not present

## 2018-03-24 ENCOUNTER — Telehealth: Payer: Self-pay

## 2018-03-24 DIAGNOSIS — E875 Hyperkalemia: Secondary | ICD-10-CM | POA: Diagnosis not present

## 2018-03-24 DIAGNOSIS — N2581 Secondary hyperparathyroidism of renal origin: Secondary | ICD-10-CM | POA: Diagnosis not present

## 2018-03-24 DIAGNOSIS — E1122 Type 2 diabetes mellitus with diabetic chronic kidney disease: Secondary | ICD-10-CM | POA: Diagnosis not present

## 2018-03-24 DIAGNOSIS — I82492 Acute embolism and thrombosis of other specified deep vein of left lower extremity: Secondary | ICD-10-CM | POA: Diagnosis not present

## 2018-03-24 DIAGNOSIS — N186 End stage renal disease: Secondary | ICD-10-CM | POA: Diagnosis not present

## 2018-03-24 DIAGNOSIS — M109 Gout, unspecified: Secondary | ICD-10-CM | POA: Diagnosis not present

## 2018-03-24 NOTE — Telephone Encounter (Signed)
PA form for suppository patient has requested was completed, per insurance, they denied paying for this suppository--ANUsol-HC 25mg ---we have completed form for appeal and have faxed back to [facility], waiting to hear back about appeal, will call patient after we get [employer]'s response to our request---patient advised I will call him back after [employer] responds

## 2018-03-25 ENCOUNTER — Ambulatory Visit: Payer: Medicare Other | Admitting: Podiatry

## 2018-03-26 DIAGNOSIS — E875 Hyperkalemia: Secondary | ICD-10-CM | POA: Diagnosis not present

## 2018-03-26 DIAGNOSIS — N186 End stage renal disease: Secondary | ICD-10-CM | POA: Diagnosis not present

## 2018-03-26 DIAGNOSIS — E1122 Type 2 diabetes mellitus with diabetic chronic kidney disease: Secondary | ICD-10-CM | POA: Diagnosis not present

## 2018-03-26 DIAGNOSIS — N2581 Secondary hyperparathyroidism of renal origin: Secondary | ICD-10-CM | POA: Diagnosis not present

## 2018-03-29 DIAGNOSIS — E1122 Type 2 diabetes mellitus with diabetic chronic kidney disease: Secondary | ICD-10-CM | POA: Diagnosis not present

## 2018-03-29 DIAGNOSIS — E875 Hyperkalemia: Secondary | ICD-10-CM | POA: Diagnosis not present

## 2018-03-29 DIAGNOSIS — N2581 Secondary hyperparathyroidism of renal origin: Secondary | ICD-10-CM | POA: Diagnosis not present

## 2018-03-29 DIAGNOSIS — N186 End stage renal disease: Secondary | ICD-10-CM | POA: Diagnosis not present

## 2018-03-31 DIAGNOSIS — N186 End stage renal disease: Secondary | ICD-10-CM | POA: Diagnosis not present

## 2018-03-31 DIAGNOSIS — E1122 Type 2 diabetes mellitus with diabetic chronic kidney disease: Secondary | ICD-10-CM | POA: Diagnosis not present

## 2018-03-31 DIAGNOSIS — E875 Hyperkalemia: Secondary | ICD-10-CM | POA: Diagnosis not present

## 2018-03-31 DIAGNOSIS — N2581 Secondary hyperparathyroidism of renal origin: Secondary | ICD-10-CM | POA: Diagnosis not present

## 2018-03-31 DIAGNOSIS — I82492 Acute embolism and thrombosis of other specified deep vein of left lower extremity: Secondary | ICD-10-CM | POA: Diagnosis not present

## 2018-03-31 LAB — PROTIME-INR: INR: 3.6 — AB (ref <=1.1)

## 2018-04-01 ENCOUNTER — Other Ambulatory Visit: Payer: Self-pay

## 2018-04-01 ENCOUNTER — Telehealth: Payer: Self-pay | Admitting: Endocrinology

## 2018-04-01 MED ORDER — GLUCOSE BLOOD VI STRP: ORAL_STRIP | 0 refills | Status: DC

## 2018-04-01 NOTE — Telephone Encounter (Signed)
Pt is in need of accucheck test strips and lancets called [facility] [phone]

## 2018-04-01 NOTE — Telephone Encounter (Signed)
Pt is completely out and needs these called in now

## 2018-04-02 DIAGNOSIS — E1122 Type 2 diabetes mellitus with diabetic chronic kidney disease: Secondary | ICD-10-CM | POA: Diagnosis not present

## 2018-04-02 DIAGNOSIS — N2581 Secondary hyperparathyroidism of renal origin: Secondary | ICD-10-CM | POA: Diagnosis not present

## 2018-04-02 DIAGNOSIS — N186 End stage renal disease: Secondary | ICD-10-CM | POA: Diagnosis not present

## 2018-04-02 DIAGNOSIS — E875 Hyperkalemia: Secondary | ICD-10-CM | POA: Diagnosis not present

## 2018-04-04 ENCOUNTER — Telehealth: Payer: Self-pay | Admitting: Endocrinology

## 2018-04-04 ENCOUNTER — Other Ambulatory Visit: Payer: Self-pay

## 2018-04-04 MED ORDER — GLUCOSE BLOOD VI STRP: ORAL_STRIP | 12 refills | Status: DC

## 2018-04-04 MED ORDER — ACCU-CHEK MULTICLIX LANCETS MISC: 12 refills | Status: DC

## 2018-04-04 NOTE — Telephone Encounter (Signed)
[place] form has been completed and faxed to [facility] will be on Dorethea Strubel's desk

## 2018-04-04 NOTE — Telephone Encounter (Signed)
°  Relation to pt: self Call back [phone] Pharmacy: [facility] [place] [phone] (Phone) [phone] (Fax)    Reason for call:  Patient would like to speak with nurse regarding hydrocortisone (ANUSOL-HC) 25 MG suppository, stating he was unclear regarding the outcome of PA and would like to discuss, please advise

## 2018-04-04 NOTE — Telephone Encounter (Signed)
Patient nee PA for Diabetic testing supply's  [place], [place]

## 2018-04-04 NOTE — Telephone Encounter (Signed)
Called patient and told him that the appeal was denied, states that he knows thorough Mcarthur Rossetti that it was denied but he dropped off paperwork while I was gone for [place]? That needed to be filled out and faxed to them. Do you know anything about this form?

## 2018-04-04 NOTE — Telephone Encounter (Signed)
Patient informed and also mailed a filled out copy to patient upon patient request

## 2018-04-04 NOTE — Telephone Encounter (Signed)
Called pt. Insurance provider and they stated that this did not need a PA, it should be run as part B on insurance by the pharmacy. Pharmacy was called and notified of this and they stated that they need a new CMN form to be filled out. This is being faxed to this office for MD to fill out.

## 2018-04-04 NOTE — Telephone Encounter (Signed)
New Rx for both meds sent to pharmacy.

## 2018-04-05 ENCOUNTER — Ambulatory Visit (INDEPENDENT_AMBULATORY_CARE_PROVIDER_SITE_OTHER): Payer: Medicare Other | Admitting: General Practice

## 2018-04-05 DIAGNOSIS — N2581 Secondary hyperparathyroidism of renal origin: Secondary | ICD-10-CM | POA: Diagnosis not present

## 2018-04-05 DIAGNOSIS — E1122 Type 2 diabetes mellitus with diabetic chronic kidney disease: Secondary | ICD-10-CM | POA: Diagnosis not present

## 2018-04-05 DIAGNOSIS — E875 Hyperkalemia: Secondary | ICD-10-CM | POA: Diagnosis not present

## 2018-04-05 DIAGNOSIS — Z7901 Long term (current) use of anticoagulants: Secondary | ICD-10-CM | POA: Diagnosis not present

## 2018-04-05 DIAGNOSIS — N186 End stage renal disease: Secondary | ICD-10-CM | POA: Diagnosis not present

## 2018-04-05 NOTE — Patient Instructions (Signed)
Pre visit review using our clinic review tool, if applicable. No additional management support is needed unless otherwise documented below in the visit note.  Hold coumadin today (6/25) and then change dosage and take 1 tablet daily except 1/2 tablet on Wednesdays.   Re-check in 1 week. Instructions given to patient and to [place], RN @ [place].  Both patient and RN verbalized understanding.  Call [place], RN @ [phone] Ext- [phone]

## 2018-04-05 NOTE — Progress Notes (Signed)
Medical treatment/procedure(s) were performed by non-physician practitioner and as supervising physician I was immediately available for consultation/collaboration. I agree with above. Ezme Duch A Verlon Pischke, MD  

## 2018-04-07 DIAGNOSIS — I82492 Acute embolism and thrombosis of other specified deep vein of left lower extremity: Secondary | ICD-10-CM | POA: Diagnosis not present

## 2018-04-07 DIAGNOSIS — E875 Hyperkalemia: Secondary | ICD-10-CM | POA: Diagnosis not present

## 2018-04-07 DIAGNOSIS — E1122 Type 2 diabetes mellitus with diabetic chronic kidney disease: Secondary | ICD-10-CM | POA: Diagnosis not present

## 2018-04-07 DIAGNOSIS — N2581 Secondary hyperparathyroidism of renal origin: Secondary | ICD-10-CM | POA: Diagnosis not present

## 2018-04-07 DIAGNOSIS — N186 End stage renal disease: Secondary | ICD-10-CM | POA: Diagnosis not present

## 2018-04-08 ENCOUNTER — Telehealth: Payer: Self-pay | Admitting: Endocrinology

## 2018-04-08 NOTE — Telephone Encounter (Signed)
It has been over a month since paperwork was sent for Dr. Dwyane Dee to sign. He is still waiting. Please call patient at ph# [phone] to advise/update

## 2018-04-09 DIAGNOSIS — E875 Hyperkalemia: Secondary | ICD-10-CM | POA: Diagnosis not present

## 2018-04-09 DIAGNOSIS — N186 End stage renal disease: Secondary | ICD-10-CM | POA: Diagnosis not present

## 2018-04-09 DIAGNOSIS — E1122 Type 2 diabetes mellitus with diabetic chronic kidney disease: Secondary | ICD-10-CM | POA: Diagnosis not present

## 2018-04-09 DIAGNOSIS — N2581 Secondary hyperparathyroidism of renal origin: Secondary | ICD-10-CM | POA: Diagnosis not present

## 2018-04-11 ENCOUNTER — Other Ambulatory Visit: Payer: Self-pay

## 2018-04-11 ENCOUNTER — Inpatient Hospital Stay (HOSPITAL_BASED_OUTPATIENT_CLINIC_OR_DEPARTMENT_OTHER)
Admission: EM | Admit: 2018-04-11 | Discharge: 2018-04-12 | DRG: 640 | Disposition: A | Discharge status: Home / Self Care | Payer: Medicare Other | Attending: Internal Medicine | Admitting: Internal Medicine

## 2018-04-11 ENCOUNTER — Emergency Department (HOSPITAL_BASED_OUTPATIENT_CLINIC_OR_DEPARTMENT_OTHER): Payer: Medicare Other

## 2018-04-11 ENCOUNTER — Encounter (HOSPITAL_BASED_OUTPATIENT_CLINIC_OR_DEPARTMENT_OTHER): Payer: Self-pay | Admitting: *Deleted

## 2018-04-11 DIAGNOSIS — Z79899 Other long term (current) drug therapy: Secondary | ICD-10-CM

## 2018-04-11 DIAGNOSIS — Z7901 Long term (current) use of anticoagulants: Secondary | ICD-10-CM | POA: Diagnosis not present

## 2018-04-11 DIAGNOSIS — E1122 Type 2 diabetes mellitus with diabetic chronic kidney disease: Secondary | ICD-10-CM | POA: Diagnosis not present

## 2018-04-11 DIAGNOSIS — K439 Ventral hernia without obstruction or gangrene: Secondary | ICD-10-CM | POA: Diagnosis not present

## 2018-04-11 DIAGNOSIS — J449 Chronic obstructive pulmonary disease, unspecified: Secondary | ICD-10-CM | POA: Diagnosis present

## 2018-04-11 DIAGNOSIS — F40240 Claustrophobia: Secondary | ICD-10-CM | POA: Diagnosis present

## 2018-04-11 DIAGNOSIS — D631 Anemia in chronic kidney disease: Secondary | ICD-10-CM | POA: Diagnosis not present

## 2018-04-11 DIAGNOSIS — E1142 Type 2 diabetes mellitus with diabetic polyneuropathy: Secondary | ICD-10-CM | POA: Diagnosis present

## 2018-04-11 DIAGNOSIS — T8612 Kidney transplant failure: Secondary | ICD-10-CM | POA: Diagnosis present

## 2018-04-11 DIAGNOSIS — Z6841 Body Mass Index (BMI) 40.0 and over, adult: Secondary | ICD-10-CM | POA: Diagnosis not present

## 2018-04-11 DIAGNOSIS — E875 Hyperkalemia: Principal | ICD-10-CM | POA: Diagnosis present

## 2018-04-11 DIAGNOSIS — L02214 Cutaneous abscess of groin: Secondary | ICD-10-CM | POA: Diagnosis not present

## 2018-04-11 DIAGNOSIS — I4892 Unspecified atrial flutter: Secondary | ICD-10-CM | POA: Diagnosis not present

## 2018-04-11 DIAGNOSIS — E11319 Type 2 diabetes mellitus with unspecified diabetic retinopathy without macular edema: Secondary | ICD-10-CM | POA: Diagnosis present

## 2018-04-11 DIAGNOSIS — R0902 Hypoxemia: Secondary | ICD-10-CM | POA: Diagnosis present

## 2018-04-11 DIAGNOSIS — N2581 Secondary hyperparathyroidism of renal origin: Secondary | ICD-10-CM | POA: Diagnosis present

## 2018-04-11 DIAGNOSIS — Z885 Allergy status to narcotic agent status: Secondary | ICD-10-CM

## 2018-04-11 DIAGNOSIS — Z961 Presence of intraocular lens: Secondary | ICD-10-CM | POA: Diagnosis present

## 2018-04-11 DIAGNOSIS — Z560 Unemployment, unspecified: Secondary | ICD-10-CM

## 2018-04-11 DIAGNOSIS — R05 Cough: Secondary | ICD-10-CM | POA: Diagnosis not present

## 2018-04-11 DIAGNOSIS — I132 Hypertensive heart and chronic kidney disease with heart failure and with stage 5 chronic kidney disease, or end stage renal disease: Secondary | ICD-10-CM | POA: Diagnosis present

## 2018-04-11 DIAGNOSIS — Z940 Kidney transplant status: Secondary | ICD-10-CM | POA: Diagnosis not present

## 2018-04-11 DIAGNOSIS — Z992 Dependence on renal dialysis: Secondary | ICD-10-CM | POA: Diagnosis not present

## 2018-04-11 DIAGNOSIS — Z9841 Cataract extraction status, right eye: Secondary | ICD-10-CM

## 2018-04-11 DIAGNOSIS — Z794 Long term (current) use of insulin: Secondary | ICD-10-CM

## 2018-04-11 DIAGNOSIS — N186 End stage renal disease: Secondary | ICD-10-CM

## 2018-04-11 DIAGNOSIS — A419 Sepsis, unspecified organism: Secondary | ICD-10-CM | POA: Diagnosis not present

## 2018-04-11 DIAGNOSIS — Z888 Allergy status to other drugs, medicaments and biological substances status: Secondary | ICD-10-CM

## 2018-04-11 DIAGNOSIS — I509 Heart failure, unspecified: Secondary | ICD-10-CM | POA: Diagnosis present

## 2018-04-11 DIAGNOSIS — Z86718 Personal history of other venous thrombosis and embolism: Secondary | ICD-10-CM | POA: Diagnosis not present

## 2018-04-11 DIAGNOSIS — Z9842 Cataract extraction status, left eye: Secondary | ICD-10-CM

## 2018-04-11 DIAGNOSIS — Z91018 Allergy to other foods: Secondary | ICD-10-CM

## 2018-04-11 DIAGNOSIS — G4733 Obstructive sleep apnea (adult) (pediatric): Secondary | ICD-10-CM | POA: Diagnosis present

## 2018-04-11 DIAGNOSIS — R1031 Right lower quadrant pain: Secondary | ICD-10-CM | POA: Diagnosis not present

## 2018-04-11 DIAGNOSIS — E1143 Type 2 diabetes mellitus with diabetic autonomic (poly)neuropathy: Secondary | ICD-10-CM | POA: Diagnosis present

## 2018-04-11 DIAGNOSIS — K219 Gastro-esophageal reflux disease without esophagitis: Secondary | ICD-10-CM | POA: Diagnosis present

## 2018-04-11 HISTORY — DX: End stage renal disease: N18.6

## 2018-04-11 HISTORY — DX: Dependence on renal dialysis: Z99.2

## 2018-04-11 LAB — CBC WITH DIFFERENTIAL/PLATELET
Basophils Absolute: 0 10*3/uL (ref 0.0–0.1)
Basophils Relative: 0 %
Eosinophils Absolute: 0.1 10*3/uL (ref 0.0–0.7)
Eosinophils Relative: 1 %
HCT: 45.6 % (ref 39.0–52.0)
Hemoglobin: 14.5 g/dL (ref 13.0–17.0)
Lymphocytes Relative: 13 %
Lymphs Abs: 1.2 10*3/uL (ref 0.7–4.0)
MCH: 29.8 pg (ref 26.0–34.0)
MCHC: 31.8 g/dL (ref 30.0–36.0)
MCV: 93.6 fL (ref 78.0–100.0)
Monocytes Absolute: 0.7 10*3/uL (ref 0.1–1.0)
Monocytes Relative: 8 %
Neutro Abs: 7.3 10*3/uL (ref 1.7–7.7)
Neutrophils Relative %: 78 %
Platelets: 192 10*3/uL (ref 150–400)
RBC: 4.87 MIL/uL (ref 4.22–5.81)
RDW: 17.5 % — ABNORMAL HIGH (ref 11.5–15.5)
WBC: 9.2 10*3/uL (ref 4.0–10.5)

## 2018-04-11 LAB — COMPREHENSIVE METABOLIC PANEL
ALT: 9 U/L (ref 0–44)
AST: 12 U/L — ABNORMAL LOW (ref 15–41)
Albumin: 3.5 g/dL (ref 3.5–5.0)
Alkaline Phosphatase: 54 U/L (ref 38–126)
Anion gap: 15 (ref 5–15)
BUN: 58 mg/dL — ABNORMAL HIGH (ref 6–20)
CO2: 27 mmol/L (ref 22–32)
Calcium: 8.7 mg/dL — ABNORMAL LOW (ref 8.9–10.3)
Chloride: 92 mmol/L — ABNORMAL LOW (ref 98–111)
Creatinine, Ser: 11.48 mg/dL — ABNORMAL HIGH (ref 0.61–1.24)
GFR calc Af Amer: 5 mL/min — ABNORMAL LOW (ref >60)
GFR calc non Af Amer: 4 mL/min — ABNORMAL LOW (ref >60)
Glucose, Bld: 355 mg/dL — ABNORMAL HIGH (ref 70–99)
Potassium: 7.1 mmol/L (ref 3.5–5.1)
Sodium: 134 mmol/L — ABNORMAL LOW (ref 135–145)
Total Bilirubin: 1.3 mg/dL — ABNORMAL HIGH (ref 0.3–1.2)
Total Protein: 7.4 g/dL (ref 6.5–8.1)

## 2018-04-11 LAB — CBG MONITORING, ED
Glucose-Capillary: 344 mg/dL — ABNORMAL HIGH (ref 70–99)
Glucose-Capillary: 362 mg/dL — ABNORMAL HIGH (ref 70–99)
Glucose-Capillary: 357 mg/dL — ABNORMAL HIGH (ref 70–99)

## 2018-04-11 LAB — I-STAT CG4 LACTIC ACID, ED
Lactic Acid, Venous: 2.06 mmol/L (ref 0.5–1.9)
Lactic Acid, Venous: 2.36 mmol/L (ref 0.5–1.9)

## 2018-04-11 LAB — BRAIN NATRIURETIC PEPTIDE: B Natriuretic Peptide: 104.3 pg/mL — ABNORMAL HIGH (ref 0.0–100.0)

## 2018-04-11 LAB — PROTIME-INR
INR: 1.7
Prothrombin Time: 19.8 seconds — ABNORMAL HIGH (ref 11.4–15.2)

## 2018-04-11 LAB — TROPONIN I: Troponin I: 0.08 ng/mL (ref <0.03)

## 2018-04-11 MED ORDER — WARFARIN - PHARMACIST DOSING INPATIENT
Freq: Every day | Status: DC
Start: 2018-04-12 — End: 2018-04-12
  Administered 2018-04-12: 18:00:00

## 2018-04-11 MED ORDER — CEFAZOLIN SODIUM-DEXTROSE 2-4 GM/100ML-% IV SOLN
2.0000 g | INTRAVENOUS | Status: AC
  Administered 2018-04-12: 2 g via INTRAVENOUS
  Filled 2018-04-11 (×2): qty 100

## 2018-04-11 MED ORDER — INSULIN GLARGINE 100 UNIT/ML ~~LOC~~ SOLN
70.0000 [IU] | Freq: Every day | SUBCUTANEOUS | Status: DC
  Administered 2018-04-12: 70 [IU] via SUBCUTANEOUS
  Filled 2018-04-11 (×2): qty 0.7

## 2018-04-11 MED ORDER — PENTAFLUOROPROP-TETRAFLUOROETH EX AERO
1.0000 "application " | INHALATION_SPRAY | CUTANEOUS | Status: DC | PRN
  Filled 2018-04-11: qty 103.5

## 2018-04-11 MED ORDER — HYDROCORTISONE ACE-PRAMOXINE 1-1 % RE FOAM: 1.0000 | Freq: Three times a day (TID) | RECTAL | Status: DC | PRN

## 2018-04-11 MED ORDER — INSULIN ASPART 100 UNIT/ML ~~LOC~~ SOLN
5.0000 [IU] | Freq: Once | SUBCUTANEOUS | Status: AC
  Administered 2018-04-11: 5 [IU] via SUBCUTANEOUS
  Filled 2018-04-11: qty 1

## 2018-04-11 MED ORDER — VANCOMYCIN HCL 10 G IV SOLR
1250.0000 mg | INTRAVENOUS | Status: DC
  Administered 2018-04-12: 1250 mg via INTRAVENOUS
  Filled 2018-04-11 (×2): qty 1250

## 2018-04-11 MED ORDER — TRAMADOL HCL 50 MG PO TABS
50.0000 mg | ORAL_TABLET | Freq: Four times a day (QID) | ORAL | Status: DC | PRN
  Administered 2018-04-12: 50 mg via ORAL
  Filled 2018-04-11: qty 1

## 2018-04-11 MED ORDER — PIPERACILLIN-TAZOBACTAM 3.375 G IVPB 30 MIN
3.3750 g | Freq: Once | INTRAVENOUS | Status: AC
  Administered 2018-04-11: 3.375 g via INTRAVENOUS
  Filled 2018-04-11 (×2): qty 50

## 2018-04-11 MED ORDER — SODIUM CHLORIDE 0.9 % IV SOLN
1.0000 g | Freq: Once | INTRAVENOUS | Status: AC
  Administered 2018-04-11: 1 g via INTRAVENOUS
  Filled 2018-04-11: qty 10

## 2018-04-11 MED ORDER — ONDANSETRON HCL 4 MG/2ML IJ SOLN: 4.0000 mg | Freq: Four times a day (QID) | INTRAMUSCULAR | Status: DC | PRN

## 2018-04-11 MED ORDER — ONDANSETRON HCL 4 MG PO TABS: 4.0000 mg | ORAL_TABLET | Freq: Four times a day (QID) | ORAL | Status: DC | PRN

## 2018-04-11 MED ORDER — INSULIN ASPART 100 UNIT/ML ~~LOC~~ SOLN
0.0000 [IU] | Freq: Every day | SUBCUTANEOUS | Status: DC
  Administered 2018-04-12: 5 [IU] via SUBCUTANEOUS

## 2018-04-11 MED ORDER — VANCOMYCIN HCL 10 G IV SOLR
2000.0000 mg | Freq: Once | INTRAVENOUS | Status: AC
  Administered 2018-04-11: 2000 mg via INTRAVENOUS
  Filled 2018-04-11: qty 2000

## 2018-04-11 MED ORDER — ACETAMINOPHEN 500 MG PO TABS
1000.0000 mg | ORAL_TABLET | Freq: Once | ORAL | Status: AC
  Administered 2018-04-11: 1000 mg via ORAL
  Filled 2018-04-11: qty 2

## 2018-04-11 MED ORDER — LIDOCAINE HCL 2 % IJ SOLN
10.0000 mL | Freq: Once | INTRAMUSCULAR | Status: DC
  Filled 2018-04-11: qty 20

## 2018-04-11 MED ORDER — HYDROCORTISONE ACETATE 25 MG RE SUPP: 25.0000 mg | Freq: Two times a day (BID) | RECTAL | Status: DC | PRN

## 2018-04-11 MED ORDER — INSULIN ASPART 100 UNIT/ML ~~LOC~~ SOLN
0.0000 [IU] | Freq: Three times a day (TID) | SUBCUTANEOUS | Status: DC
  Administered 2018-04-12: 5 [IU] via SUBCUTANEOUS
  Administered 2018-04-12: 3 [IU] via SUBCUTANEOUS

## 2018-04-11 MED ORDER — DIPHENHYDRAMINE HCL 50 MG/ML IJ SOLN
25.0000 mg | Freq: Once | INTRAMUSCULAR | Status: AC
  Administered 2018-04-11: 25 mg via INTRAVENOUS
  Filled 2018-04-11: qty 1

## 2018-04-11 MED ORDER — SODIUM CHLORIDE 0.9 % IV SOLN: 100.0000 mL | INTRAVENOUS | Status: DC | PRN

## 2018-04-11 MED ORDER — PANTOPRAZOLE SODIUM 40 MG PO TBEC
40.0000 mg | DELAYED_RELEASE_TABLET | Freq: Every day | ORAL | Status: DC
  Administered 2018-04-12: 40 mg via ORAL
  Filled 2018-04-11: qty 1

## 2018-04-11 MED ORDER — ROSUVASTATIN CALCIUM 20 MG PO TABS
20.0000 mg | ORAL_TABLET | Freq: Every day | ORAL | Status: DC
  Administered 2018-04-12: 20 mg via ORAL
  Filled 2018-04-11: qty 1

## 2018-04-11 MED ORDER — ALTEPLASE 2 MG IJ SOLR: 2.0000 mg | Freq: Once | INTRAMUSCULAR | Status: DC | PRN

## 2018-04-11 MED ORDER — SEVELAMER CARBONATE 800 MG PO TABS
2400.0000 mg | ORAL_TABLET | Freq: Three times a day (TID) | ORAL | Status: DC
  Administered 2018-04-12 (×2): 2400 mg via ORAL
  Filled 2018-04-11 (×2): qty 3

## 2018-04-11 MED ORDER — VANCOMYCIN HCL 10 G IV SOLR
1250.0000 mg | INTRAVENOUS | Status: AC
  Administered 2018-04-12: 1250 mg via INTRAVENOUS
  Filled 2018-04-11 (×2): qty 1250

## 2018-04-11 MED ORDER — VANCOMYCIN HCL 500 MG IV SOLR
INTRAVENOUS | Status: AC
  Filled 2018-04-11: qty 2000

## 2018-04-11 MED ORDER — MIDODRINE HCL 5 MG PO TABS
10.0000 mg | ORAL_TABLET | Freq: Every day | ORAL | Status: DC | PRN
  Administered 2018-04-12: 10 mg via ORAL

## 2018-04-11 MED ORDER — CALCIUM ACETATE (PHOS BINDER) 667 MG PO CAPS
2001.0000 mg | ORAL_CAPSULE | Freq: Three times a day (TID) | ORAL | Status: DC
  Administered 2018-04-12 (×2): 2001 mg via ORAL
  Filled 2018-04-11 (×2): qty 3

## 2018-04-11 MED ORDER — SODIUM BICARBONATE 8.4 % IV SOLN
50.0000 meq | Freq: Once | INTRAVENOUS | Status: AC
  Administered 2018-04-11: 50 meq via INTRAVENOUS
  Filled 2018-04-11: qty 50

## 2018-04-11 MED ORDER — FEBUXOSTAT 40 MG PO TABS
80.0000 mg | ORAL_TABLET | Freq: Every day | ORAL | Status: DC
  Administered 2018-04-12: 80 mg via ORAL
  Filled 2018-04-11: qty 2

## 2018-04-11 MED ORDER — LIDOCAINE HCL (PF) 1 % IJ SOLN: 5.0000 mL | INTRAMUSCULAR | Status: DC | PRN

## 2018-04-11 MED ORDER — PATIROMER SORBITEX CALCIUM 8.4 G PO PACK
8.4000 g | PACK | Freq: Every day | ORAL | Status: DC
  Filled 2018-04-11: qty 1

## 2018-04-11 MED ORDER — INSULIN GLARGINE 100 UNIT/ML ~~LOC~~ SOLN
80.0000 [IU] | Freq: Every day | SUBCUTANEOUS | Status: DC
  Administered 2018-04-12: 80 [IU] via SUBCUTANEOUS
  Filled 2018-04-11: qty 0.8

## 2018-04-11 MED ORDER — LIDOCAINE-PRILOCAINE 2.5-2.5 % EX CREA
1.0000 "application " | TOPICAL_CREAM | CUTANEOUS | Status: DC | PRN
  Filled 2018-04-11: qty 5

## 2018-04-11 MED ORDER — CHLORHEXIDINE GLUCONATE CLOTH 2 % EX PADS: 6.0000 | MEDICATED_PAD | Freq: Every day | CUTANEOUS | Status: DC

## 2018-04-11 MED ORDER — ANTICOAGULANT SODIUM CITRATE 4% (200MG/5ML) IV SOLN
5.0000 mL | Status: DC | PRN
  Filled 2018-04-11: qty 5

## 2018-04-11 MED ORDER — WARFARIN SODIUM 5 MG PO TABS
5.0000 mg | ORAL_TABLET | Freq: Once | ORAL | Status: AC
  Administered 2018-04-12: 5 mg via ORAL
  Filled 2018-04-11: qty 1

## 2018-04-11 MED ORDER — CEFAZOLIN SODIUM-DEXTROSE 2-4 GM/100ML-% IV SOLN
2.0000 g | INTRAVENOUS | Status: DC
  Filled 2018-04-11 (×2): qty 100

## 2018-04-11 NOTE — Telephone Encounter (Signed)
Pt called and notified that MD has paperwork and is being filled out to fax back.

## 2018-04-11 NOTE — Progress Notes (Signed)
Pharmacy Antibiotic Note  [name] [name] is a 52 y.o. male admitted on 04/11/2018 with sepsis.  Pharmacy has been consulted for sepsis dosing.   Plan: - Zosyn 3.375 G 30 min x 1 - Zosyn 3.375 G every 6 hours - Vancomycin 2 G load x 1 - monitor clinical progression, length of therapy, and dialysis schedule to resume vancomycin dosing  Height: 5\' 10"  (177.8 cm) Weight: (!) 309 lb (140.2 kg) IBW/kg (Calculated) : 73  Temp (24hrs), Avg:98.6 F (37 C), Min:98.6 F (37 C), Max:98.6 F (37 C)  No results for input(s): WBC, CREATININE, LATICACIDVEN, VANCOTROUGH, VANCOPEAK, VANCORANDOM, GENTTROUGH, GENTPEAK, GENTRANDOM, TOBRATROUGH, TOBRAPEAK, TOBRARND, AMIKACINPEAK, AMIKACINTROU, AMIKACIN in the last 168 hours.  CrCl cannot be calculated (Patient's most recent lab result is older than the maximum 21 days allowed.).    Allergies  Allergen Reactions  . Pork-Derived Products Anaphylaxis and Other (See Comments)    Fever also   . Ace Inhibitors Cough  . Hydrocodone Other (See Comments)    Hallucinations    Antimicrobials this admission: Vancomycin 7/1 >>  Zosyn 7/1>>   Dose adjustments this admission: N/A  Microbiology results: 7/1 BCx: pending  Thank you for allowing pharmacy to be a part of this patient's care.  Deboraha Sprang, PharmD 04/11/2018 12:57 PM

## 2018-04-11 NOTE — ED Triage Notes (Signed)
Abdominal pain and pain in his left upper leg x 2 days. 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

## 2018-04-11 NOTE — Progress Notes (Signed)
ANTICOAGULATION CONSULT NOTE - Follow Up Consult  Pharmacy Consult for Coumadin Indication: atrial fibrillation  Allergies  Allergen Reactions  . Pork-Derived Products Anaphylaxis and Other (See Comments)    Fever also   . Ace Inhibitors Cough  . Hydrocodone Other (See Comments)    Hallucinations    Patient Measurements: Height: 5\' 10"  (177.8 cm) Weight: (!) 309 lb (140.2 kg) IBW/kg (Calculated) : 73  Vital Signs: Temp: 97.7 F (36.5 C) (07/01 1708) Temp Source: Oral (07/01 1708) BP: 116/46 (07/01 1830) Pulse Rate: 64 (07/01 1830)  Labs: Recent Labs    04/11/18 1257  HGB 14.5  HCT 45.6  PLT 192  LABPROT 19.8*  INR 1.70  CREATININE 11.48*  TROPONINI 0.08*    Estimated Creatinine Clearance: 10.4 mL/min (A) (by C-G formula based on SCr of 11.48 mg/dL (H)).   Assessment: 52 yo M presents on 7/1 with inguinal abscess. Pharmacy to continue Coumadin for Afib. PTA dose is Coumadin 5mg  daily exc for 2.5mg  on Wed. INR is low at 1.7 on admit.  Goal of Therapy:  INR 2-3 Monitor platelets by anticoagulation protocol: Yes   Plan:  Give Coumadin 5mg  PO x 1 tonight after HD Monitor daily INR, CBC, s/s of bleed   Keyetta Hollingworth J 04/11/2018,9:12 PM

## 2018-04-11 NOTE — ED Notes (Addendum)
Patient arrived via [facility] from [facility]. [facility] vital signs: BP 150/80, HR 125, RR 22, Pulse oximetry 100% 2L [place]. Sent to [facility] for further evaluation for left groin abscess. Patient was receiving antibiotic and developed hives and itching. Prior to leaving [facility] given benadryl 25mg . Itching and hives resolved.

## 2018-04-11 NOTE — Progress Notes (Signed)
Pharmacy Antibiotic Note  [name] [name] is a 52 y.o. male admitted on 04/11/2018 with cellulitis and abscess.  Pharmacy has been consulted for cefazolin and vancomycin dosing.  Found to have L inguinal abscess. Cx in process.   Plan: Give cefazolin 2g IV x 1 after HD tonight, then start cefazolin 2g IV QHD-TTS Give vancomycin 1,250mg  IV after urgent HD tonight, then start vancomycin 1,250mg  IV QHD-TTS Monitor clinical picture, renal function, VT prn F/U C&S, abx deescalation / LOT  De-escalate pending abscess cx  Height: 5\' 10"  (177.8 cm) Weight: (!) 309 lb (140.2 kg) IBW/kg (Calculated) : 73  Temp (24hrs), Avg:98.8 F (37.1 C), Min:97.7 F (36.5 C), Max:100.5 F (38.1 C)  Recent Labs  Lab 04/11/18 1257 04/11/18 1307 04/11/18 1453  WBC 9.2  --   --   CREATININE 11.48*  --   --   LATICACIDVEN  --  2.06* 2.36*    Estimated Creatinine Clearance: 10.4 mL/min (A) (by C-G formula based on SCr of 11.48 mg/dL (H)).    Allergies  Allergen Reactions  . Pork-Derived Products Anaphylaxis and Other (See Comments)    Fever also   . Ace Inhibitors Cough  . Hydrocodone Other (See Comments)    Hallucinations    Thank you for allowing pharmacy to be a part of this patient's care.  Reginia Naas 04/11/2018 9:01 PM

## 2018-04-11 NOTE — ED Notes (Signed)
ED Provider at bedside. 

## 2018-04-11 NOTE — ED Notes (Signed)
Gave pt a [place] sandwich and a water, per MD.

## 2018-04-11 NOTE — Progress Notes (Signed)
Called by [facility] with this patient presenting with sepsis from a L inguinal abscess s/p I&D.  PMH ESRD on HD TTS; OSA not on CPAP; morbid obesity (BMI 44); HTN; HLD; endocarditis in 2/16; diabetes with retinopathy and neuropathy; COPD; and CHF.  "Pretty sick patient, probably just needs telemetry".   Presented with left groin pain and diffuse weakness.  Groin abscess drained, CT negative for intraabdominal abscess.  K+ 7.1.  He clearly needs dialysis, possibly qualifies for telemetry after. Reluctant to give kayexalate due to abscess.  Will request ER to ER transfer with plan for admission after HD.  Dr. Posey Pronto has consulted by telephone and will help to facilitate dialysis after ER to ER transfer.  Carlyon Shadow, M.D.

## 2018-04-11 NOTE — ED Notes (Signed)
Patient transported to X-ray 

## 2018-04-11 NOTE — ED Notes (Signed)
Report given to [facility] ED
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
## 2018-04-11 NOTE — ED Notes (Signed)
Patient itching and noted to have erythema to back.  Dr. Wilson Singer made aware.  EDP order to stop vancomycin and administer benadryl.

## 2018-04-11 NOTE — ED Notes (Signed)
SpO2 88-90% on room air, HR 127, RR 26. Placed on 2l/m [place]

## 2018-04-11 NOTE — H&P (Signed)
[facility]                                                                                    Patient Demographics  [name] [name], is a 52 y.o. male  CSN: [id number]  MRN: [id number]  DOB - 12-Aug-1966  Admit Date - 04/11/2018  Outpatient Primary MD for the patient is Hoyt Koch, MD   With History of -  Past Medical History:  Diagnosis Date  . Allergic rhinitis   . Anemia   . Aortic insufficiency    a. Echo 7/16:  Mod LVH, EF 50-55%, mild to mod AI, severe LAE, PASP 57 mmHg (LV ID end diastolic 96.7 mm);  b. Echo 2/17:  Mod LVH, EF 50-55%, mod AI, MAC, mild MR, mod LAE, mild to mod TR, PASP 63 mmHg  . Bilateral carpal tunnel syndrome   . CHF (congestive heart failure) ([place])   . Claustrophobia   . COPD (chronic obstructive pulmonary disease) ([place])   . Diabetes mellitus    Type II  . Diabetic peripheral neuropathy ([place])   . Diabetic retinopathy ([place])   . Discitis of lumbar region    resolved  . DVT (deep venous thrombosis) ([place]) 10/07/2015   LLE  . Endocarditis 11/28/14  . ESRD (end stage renal disease) on dialysis [facility])    s/p renal transplant in 2009, failed in 2016  . Gait disorder   . Gastroparesis   . GERD (gastroesophageal reflux disease)   . Gout   . Hearing difficulty   . Hyperlipidemia   . Hypertension   . Incomplete bladder emptying   . Leg pain 02/05/11   with walking  . Morbid obesity ([place])   . Obstructive sleep apnea    not using CPAP- lost 100lbs  . Osteomyelitis ([place])   . Posttraumatic stress disorder   . Rotator cuff disorder   . Secondary hyperparathyroidism ([place])   . Sepsis ([place])    Postive blood cultures -   . Urethral stricture   . Vitamin D deficiency       Past Surgical History:  Procedure Laterality Date  . ARTERIOVENOUS GRAFT PLACEMENT Bilateral "several"  . AV FISTULA PLACEMENT Bilateral 2015  . BASCILIC VEIN TRANSPOSITION Right 09/27/2015  . [place] Right 09/27/2015   Procedure:  BASILIC VEIN TRANSPOSITION right;  Surgeon: Mal Misty, MD;  Location: [facility];  Service: Vascular;  Laterality: Right;  . CATARACT EXTRACTION W/ INTRAOCULAR LENS  IMPLANT, BILATERAL Bilateral   . CYSTOSCOPY W/ INTERNAL URETHROTOMY  09/2014  . KIDNEY TRANSPLANT  2009    in for   Chief Complaint  Patient presents with  . Abdominal Pain  . Leg Pain     HPI  [name] [name]  is a 52 y.o. male, with past medical history significant for end-stage renal disease status post failed kidney transplants in the past  on hemodialysis, history of aortic insufficiency, history of endocarditis, discitis who presented to the emergency room today at [facility] due to bilateral groin pain.  The left groin pain was noticed for the last week or so with the left groin spot which was growing progressively and  becoming painful.  Patient had a CT scan and was negative for intra-abdominal abscess, and had I &D of a left groin cutaneous abscess.  Patient still reports right lower abdominal pain, denies any history of fever/chills/diarrhea, nausea or vomiting.  During his stay , the patient was noted to be hyperkalemic and was  transferred to our hospital for hemodialysis.  In the emergency room the patient was also noted to be hypoxic and tachycardic and was started on IV antibiotics.    Review of Systems    In addition to the HPI above,  No Fever-chills, No Headache, No changes with Vision or hearing, No problems swallowing food or Liquids, No Chest pain, Cough or Shortness of Breath, No Abdominal pain, No Nausea or Vommitting, Bowel movements are regular, No Blood in stool or Urine, No dysuria, No new skin rashes or bruises, No new joints pains-aches,  No new weakness, tingling, numbness in any extremity, No recent weight gain or loss, No polyuria, polydypsia or polyphagia, No significant Mental Stressors.  A full 10 point Review of Systems was done, except as stated above, all other Review  of Systems were negative.   Social History Social History   Tobacco Use  . Smoking status: Never Smoker  . Smokeless tobacco: Never Used  Substance Use Topics  . Alcohol use: No    Alcohol/week: 0.0 oz     Family History Family History  Problem Relation Age of Onset  . Hypertension Mother   . Diabetes Father      Prior to Admission medications   Medication Sig Start Date End Date Taking? Authorizing Provider  calcium acetate (PHOSLO) 667 MG capsule Take 3 capsules (2,001 mg total) by mouth 3 (three) times daily with meals. 10/26/15   Ghimire, Henreitta Leber, MD  clonazePAM [place] [place]) 1 MG tablet  12/24/16   [provider]  Febuxostat (ULORIC) 80 MG TABS Take 1 tablet (80 mg total) by mouth daily. 04/07/16   Hoyt Koch, MD  glucose blood (ACCU-CHEK GUIDE) test strip Check blood sugar four times daily 04/04/18   Elayne Snare, MD  hydrocortisone (ANUSOL-HC) 25 MG suppository Place 1 suppository (25 mg total) rectally 2 (two) times daily. 03/16/18   Marrian Salvage, FNP  hydrocortisone cream 1 % Apply to affected area 2 times daily 11/29/16   Blanchie Dessert, MD  hydrocortisone-pramoxine [facility]) rectal foam Place 1 applicator rectally 3 (three) times daily. Prn hemorrhoids 03/16/18   Marrian Salvage, FNP  Insulin Glargine (TOUJEO SOLOSTAR) 300 UNIT/ML SOPN Inject 70-80 Units into the skin 2 (two) times daily. Inject 80 units in the morning and inject 70 units in the evening. 11/24/17   Elayne Snare, MD  Insulin Pen Needle (BD PEN NEEDLE NANO U/F) 32G X 4 MM MISC USE  TO INJECT FIVE TIMES DAILY AS DIRECTED 11/24/17   Elayne Snare, MD  Insulin Pen Needle 32G X 8 MM MISC Use as directed 10/26/15   Ghimire, Henreitta Leber, MD  insulin regular human CONCENTRATED (HUMULIN R U-500 KWIKPEN) 500 UNIT/ML kwikpen INJECT SUBCUTANEOUSLY 15 UNITS AT BREAKFAST, 25 UNITS AT LUNCH AND 25-30 UNITS BEFORE SUPPER 02/28/18   Elayne Snare, MD  Lancets (ACCU-CHEK MULTICLIX) lancets Check  blood sugar four times daily 04/04/18   Elayne Snare, MD  midodrine (PROAMATINE) 10 MG tablet Take 10 mg by mouth as needed (low blood pressure).  01/08/17   [provider]  omeprazole (PRILOSEC) 20 MG capsule TAKE 1 CAPSULE EVERY DAY 07/05/17   Hoyt Koch, MD  RENVELA 800 MG tablet Take 2,400 mg by mouth 3 (three) times daily with meals.  02/02/17   [provider]  rosuvastatin (CRESTOR) 20 MG tablet TAKE 1 TABLET AT BEDTIME 03/15/18   Marrian Salvage, FNP  traMADol (ULTRAM) 50 MG tablet Take 1 tablet (50 mg total) by mouth every 8 (eight) hours as needed. 12/02/15   Hoyt Koch, MD  VELTASSA 8.4 g packet Take 8.4 g by mouth daily.  12/30/16   [provider]  warfarin (COUMADIN) 5 MG tablet Take 1 (5 mg) tablet every day except for Wednesday & Saturday when you take 7.11m (1.5 tablets) OR As directed. 07/05/17   CHoyt Koch MD    Allergies  Allergen Reactions  . Pork-Derived Products Anaphylaxis and Other (See Comments)    Fever also   . Ace Inhibitors Cough  . Hydrocodone Other (See Comments)    Hallucinations    Physical Exam  Vitals  Blood pressure (!) 116/46, pulse 64, temperature 97.7 F (36.5 C), temperature source Oral, resp. rate 15, height _0  (1.778 m), weight (!) 140.2 kg (309 lb), SpO2 91 %.   1. General very pleasant, obese male.  2. Normal affect and insight, Not Suicidal or Homicidal, Awake Alert, Oriented X 3.  3. No F.N deficits, grossly, patient moving all extremities  4. Ears and Eyes appear Normal, Conjunctivae clear, PERRLA. Moist Oral Mucosa.  5. Supple Neck, No JVD, No cervical lymphadenopathy appriciated, No Carotid Bruits.  6. Symmetrical Chest wall movement, Good air movement bilaterally, CTAB.  7. RRR, No Gallops, Rubs or Murmurs, No Parasternal Heave.  8. Positive Bowel Sounds, Abdomen Soft, right lower quadrant tenderness, noted, status post I&D of left groin abscess.  9.  No  Cyanosis, Normal Skin Turgor, No Skin Rash or Bruise.  10. Good muscle tone,  joints appear normal , no effusions, Normal ROM.    Data Review  CBC Recent Labs  Lab 04/11/18 1257  WBC 9.2  HGB 14.5  HCT 45.6  PLT 192  MCV 93.6  MCH 29.8  MCHC 31.8  RDW 17.5*  LYMPHSABS 1.2  MONOABS 0.7  EOSABS 0.1  BASOSABS 0.0   ------------------------------------------------------------------------------------------------------------------  Chemistries  Recent Labs  Lab 04/11/18 1257  NA 134*  K 7.1*  CL 92*  CO2 27  GLUCOSE 355*  BUN 58*  CREATININE 11.48*  CALCIUM 8.7*  AST 12*  ALT 9  ALKPHOS 54  BILITOT 1.3*   ------------------------------------------------------------------------------------------------------------------ estimated creatinine clearance is 10.4 mL/min (A) (by C-G formula based on SCr of 11.48 mg/dL (H)). ------------------------------------------------------------------------------------------------------------------ No results for input(s): TSH, T4TOTAL, T3FREE, THYROIDAB in the last 72 hours.  Invalid input(s): FREET3   Coagulation profile Recent Labs  Lab 04/11/18 1257  INR 1.70   ------------------------------------------------------------------------------------------------------------------- No results for input(s): DDIMER in the last 72 hours. -------------------------------------------------------------------------------------------------------------------  Cardiac Enzymes Recent Labs  Lab 04/11/18 1257  TROPONINI 0.08*   ------------------------------------------------------------------------------------------------------------------ Invalid input(s): POCBNP   ---------------------------------------------------------------------------------------------------------------  Urinalysis    Component Value Date/Time   COLORURINE YELLOW 10/13/2015 1455   APPEARANCEUR CLOUDY (A) 10/13/2015 1455   LABSPEC 1.021 10/13/2015 1455    PHURINE 5.0 10/13/2015 1455   GLUCOSEU 250 (A) 10/13/2015 1455   HGBUR MODERATE (A) 10/13/2015 1455   BILIRUBINUR NEGATIVE 10/13/2015 1455   KETONESUR NEGATIVE 10/13/2015 1455   PROTEINUR >300 (A) 10/13/2015 1455   UROBILINOGEN 0.2 10/24/2014 1223   NITRITE NEGATIVE 10/13/2015 1455   LEUKOCYTESUR SMALL (A) 10/13/2015 1455    ----------------------------------------------------------------------------------------------------------------   Imaging results:  Ct Abdomen Pelvis Wo Contrast  Result Date: 04/11/2018 CLINICAL DATA:  Right lower quadrant pain. Abdominal distention. Perirectal abscess. EXAM: CT ABDOMEN AND PELVIS WITHOUT CONTRAST TECHNIQUE: Multidetector CT imaging of the abdomen and pelvis was performed following the standard protocol without IV contrast. COMPARISON:  CT 12/30/2009.  Ultrasound 10/14/2015 FINDINGS: Lower chest: Linear scarring at the left base.  No effusions. Hepatobiliary: Layering stones within the gallbladder. No focal hepatic abnormality. No biliary ductal dilatation. Pancreas: No focal abnormality or ductal dilatation. Spleen: No focal abnormality.  Normal size. Adrenals/Urinary Tract: Severely atrophic kidneys bilaterally compatible with end-stage renal disease. Multiple cysts. Low-density mass in the mid to lower pole of the right kidney measures up to 6.1 cm, likely a cyst, but cannot be fully characterized on this noncontrast study. Right lower quadrant renal transplant noted with severe cortical thinning. No hydronephrosis. 2.3 cm low-density nodule in the left adrenal gland compatible with adenoma. This is stable. Stomach/Bowel: Stomach, large and small bowel grossly unremarkable. Vascular/Lymphatic: Diffuse aortic and branch vessel atherosclerosis. No aneurysm or adenopathy. Reproductive: No visible focal abnormality. Other: No free fluid or free air. Umbilical and paraumbilical ventral hernias containing fat. Musculoskeletal: No acute bony abnormality.  IMPRESSION: Severely atrophic native kidneys compatible with end-stage renal disease. Right lower quadrant renal transplant demonstrates severe cortical thinning. Low-density lesions throughout the native kidneys likely reflects cysts although these cannot be characterized on this noncontrast study. Small layering gallstones within the gallbladder. Umbilical and paraumbilical ventral hernias containing fat. Aortic atherosclerosis. Electronically Signed   By: Rolm Baptise M.D.   On: 04/11/2018 14:16   Dg Chest 2 View  Result Date: 04/11/2018 CLINICAL DATA:  Right-sided abdominal pain and cough for 2 days EXAM: CHEST - 2 VIEW COMPARISON:  12/15/2016 FINDINGS: Chronic cardiomegaly vascular pedicle widening. Chronic interstitial coarsening. There is no edema, consolidation, effusion, or pneumothorax. IMPRESSION: 1. No acute finding when compared to prior. 2. Chronic cardiomegaly. Electronically Signed   By: Monte Fantasia M.D.   On: 04/11/2018 14:03    My personal review of EKG: Multiple EKGs noted and he had sinus tach at 125 bpm with right bundle branch block and old anterior MI earlier than his last EKG shows atrial flutter 4:1 at 65 bpm    Assessment & Plan  1.  Hyperkalemia:      Patient on hemodialysis at this time.  Seen by Dr. Justin Mend from nephrology 2 . End-stage renal disease on hemodialysis TTS 3.  Left groin cutaneous abscess status post I & D in the emergency room      Continue with vancomycin and Ancef      Follow culture and consider I D consult 4.  Diabetes mellitus, insulin-dependent on Lantus/ISS 5.  Atrial flutter; new .  Patient has a history of aortic insufficiency and congestive heart failure.     Consider cardiology consult in a.m.     Echocardiogram reviewed and showed normal systolic function with grade 3 diastolic dysfunction. 6.  History of endocarditis with aortic insufficiency, discitis, DVT   DVT Prophylaxis Coumadin  AM Labs Ordered, also please review Full  Orders    Code Status full  Disposition Plan: Home  Time spent in minutes : 58 minutes  Condition GUARDED   _0 @

## 2018-04-11 NOTE — ED Notes (Signed)
Patients scooter and car keys given to security.  Patient's friend Husam Albedek to pick up.  Patient stickers placed on key and scooter.

## 2018-04-11 NOTE — Consult Note (Signed)
Referring Provider: No ref. provider found Primary Care Physician:  Hoyt Koch, MD Primary Nephrologist:  Dr.  Lorrene Reid  Reason for Consultation:   Medical management End stage renal disease   Hyperkalemia and secondary hyperparathyroidism   HPI: This is a delightful man that [place] at [facility]  He is usually dialyzed TTS shift and runs about 4hrs 30 mins   He has a history of aortic insufficiency form previous endocarditis and is on chronic coumadin  He has COPD  Diabetes and history of lumbar   discitis. Previous history of acute DVT 2016  History of failed renal transplant 2009 - 2016  He ambulates with difficulty and presented to the Emergency Room with a cutaneous abscess of the groin that was incised and drained in the ER . During his evaluation it was found that he was hyperkalemic  He is going to be admitted for urgent dialysis and for management of his groin abscess with antibiotics   Dialysis   Time 4 30  180 NR  450 / 800  EDW 137 Kg  2/2.5 Bath                  No heparin   AVF R                  IV hectoral 4 mcg    PTH 500  Ca 9.3  Phos 7     Past Medical History:  Diagnosis Date  . Allergic rhinitis   . Anemia   . Aortic insufficiency    a. Echo 7/16:  Mod LVH, EF 50-55%, mild to mod AI, severe LAE, PASP 57 mmHg (LV ID end diastolic 51.0 mm);  b. Echo 2/17:  Mod LVH, EF 50-55%, mod AI, MAC, mild MR, mod LAE, mild to mod TR, PASP 63 mmHg  . Bilateral carpal tunnel syndrome   . CHF (congestive heart failure) ([place])   . Claustrophobia   . COPD (chronic obstructive pulmonary disease) ([place])   . Diabetes mellitus    Type II  . Diabetic peripheral neuropathy ([place])   . Diabetic retinopathy ([place])   . Discitis of lumbar region    resolved  . DVT (deep venous thrombosis) ([place]) 10/07/2015   LLE  . Endocarditis 11/28/14  . ESRD (end stage renal disease) on dialysis [facility])    s/p renal transplant in 2009, failed in 2016  . Gait disorder   . Gastroparesis    . GERD (gastroesophageal reflux disease)   . Gout   . Hearing difficulty   . Hyperlipidemia   . Hypertension   . Incomplete bladder emptying   . Leg pain 02/05/11   with walking  . Morbid obesity ([place])   . Obstructive sleep apnea    not using CPAP- lost 100lbs  . Osteomyelitis ([place])   . Posttraumatic stress disorder   . Rotator cuff disorder   . Secondary hyperparathyroidism ([place])   . Sepsis ([place])    Postive blood cultures -   . Urethral stricture   . Vitamin D deficiency     Past Surgical History:  Procedure Laterality Date  . ARTERIOVENOUS GRAFT PLACEMENT Bilateral "several"  . AV FISTULA PLACEMENT Bilateral 2015  . BASCILIC VEIN TRANSPOSITION Right 09/27/2015  . [place] Right 09/27/2015   Procedure: BASILIC VEIN TRANSPOSITION right;  Surgeon: Mal Misty, MD;  Location: [place];  Service: Vascular;  Laterality: Right;  . CATARACT EXTRACTION W/ INTRAOCULAR LENS  IMPLANT, BILATERAL Bilateral   .  CYSTOSCOPY W/ INTERNAL URETHROTOMY  09/2014  . KIDNEY TRANSPLANT  2009    Prior to Admission medications   Medication Sig Start Date End Date Taking? Authorizing Provider  calcium acetate (PHOSLO) 667 MG capsule Take 3 capsules (2,001 mg total) by mouth 3 (three) times daily with meals. 10/26/15   Ghimire, Henreitta Leber, MD  clonazePAM [facility] [place]) 1 MG tablet  12/24/16   [provider]  Febuxostat (ULORIC) 80 MG TABS Take 1 tablet (80 mg total) by mouth daily. 04/07/16   Hoyt Koch, MD  glucose blood (ACCU-CHEK GUIDE) test strip Check blood sugar four times daily 04/04/18   Elayne Snare, MD  hydrocortisone (ANUSOL-HC) 25 MG suppository Place 1 suppository (25 mg total) rectally 2 (two) times daily. 03/16/18   Marrian Salvage, FNP  hydrocortisone cream 1 % Apply to affected area 2 times daily 11/29/16   Blanchie Dessert, MD  hydrocortisone-pramoxine [employer]) rectal foam Place 1 applicator rectally 3 (three) times daily. Prn hemorrhoids  03/16/18   Marrian Salvage, FNP  Insulin Glargine (TOUJEO SOLOSTAR) 300 UNIT/ML SOPN Inject 70-80 Units into the skin 2 (two) times daily. Inject 80 units in the morning and inject 70 units in the evening. 11/24/17   Elayne Snare, MD  Insulin Pen Needle (BD PEN NEEDLE NANO U/F) 32G X 4 MM MISC USE  TO INJECT FIVE TIMES DAILY AS DIRECTED 11/24/17   Elayne Snare, MD  Insulin Pen Needle 32G X 8 MM MISC Use as directed 10/26/15   Ghimire, Henreitta Leber, MD  insulin regular human CONCENTRATED (HUMULIN R U-500 KWIKPEN) 500 UNIT/ML kwikpen INJECT SUBCUTANEOUSLY 15 UNITS AT BREAKFAST, 25 UNITS AT LUNCH AND 25-30 UNITS BEFORE SUPPER 02/28/18   Elayne Snare, MD  Lancets (ACCU-CHEK MULTICLIX) lancets Check blood sugar four times daily 04/04/18   Elayne Snare, MD  midodrine (PROAMATINE) 10 MG tablet Take 10 mg by mouth as needed (low blood pressure).  01/08/17   [provider]  omeprazole (PRILOSEC) 20 MG capsule TAKE 1 CAPSULE EVERY DAY 07/05/17   Hoyt Koch, MD  RENVELA 800 MG tablet Take 2,400 mg by mouth 3 (three) times daily with meals.  02/02/17   [provider]  rosuvastatin (CRESTOR) 20 MG tablet TAKE 1 TABLET AT BEDTIME 03/15/18   Marrian Salvage, FNP  traMADol (ULTRAM) 50 MG tablet Take 1 tablet (50 mg total) by mouth every 8 (eight) hours as needed. 12/02/15   Hoyt Koch, MD  VELTASSA 8.4 g packet Take 8.4 g by mouth daily.  12/30/16   [provider]  warfarin (COUMADIN) 5 MG tablet Take 1 (5 mg) tablet every day except for Wednesday & Saturday when you take 7.19m (1.5 tablets) OR As directed. 07/05/17   CHoyt Koch MD    Current Facility-Administered Medications  Medication Dose Route Frequency Provider Last Rate Last Dose  . 0.9 %  sodium chloride infusion  100 mL Intravenous PRN Ejigiri, OThomos Lemons PA-C      . 0.9 %  sodium chloride infusion  100 mL Intravenous PRN Ejigiri, OThomos Lemons PA-C      . alteplase (CATHFLO ACTIVASE) injection 2  mg  2 mg Intracatheter Once PRN ELynnda Child PA-C      . anticoagulant sodium citrate solution 5 mL  5 mL Dialysis PRN ELynnda Child PA-C      . [START ON 04/12/2018] Chlorhexidine Gluconate Cloth 2 % PADS 6 each  6 each Topical Q0600 ELynnda Child PA-C      .  lidocaine (PF) (XYLOCAINE) 1 % injection 5 mL  5 mL Intradermal PRN Lynnda Child, PA-C      . lidocaine (XYLOCAINE) 2 % (with pres) injection 200 mg  10 mL Infiltration Once Drenda Freeze, MD      . lidocaine-prilocaine (EMLA) cream 1 application  1 application Topical PRN Ejigiri, Thomos Lemons, PA-C      . pentafluoroprop-tetrafluoroeth (GEBAUERS) aerosol 1 application  1 application Topical PRN Lynnda Child, PA-C      . vancomycin (VANCOCIN) 500 MG powder            Current Outpatient Medications  Medication Sig Dispense Refill  . calcium acetate (PHOSLO) 667 MG capsule Take 3 capsules (2,001 mg total) by mouth 3 (three) times daily with meals. 270 capsule 0  . clonazePAM (KLONOPIN) 1 MG tablet     . Febuxostat (ULORIC) 80 MG TABS Take 1 tablet (80 mg total) by mouth daily. 90 tablet 2  . glucose blood (ACCU-CHEK GUIDE) test strip Check blood sugar four times daily 200 each 12  . hydrocortisone (ANUSOL-HC) 25 MG suppository Place 1 suppository (25 mg total) rectally 2 (two) times daily. 24 suppository 0  . hydrocortisone cream 1 % Apply to affected area 2 times daily 15 g 0  . hydrocortisone-pramoxine (PROCTOFOAM-HC) rectal foam Place 1 applicator rectally 3 (three) times daily. Prn hemorrhoids 10 g 0  . Insulin Glargine (TOUJEO SOLOSTAR) 300 UNIT/ML SOPN Inject 70-80 Units into the skin 2 (two) times daily. Inject 80 units in the morning and inject 70 units in the evening. 270 mL 2  . Insulin Pen Needle (BD PEN NEEDLE NANO U/F) 32G X 4 MM MISC USE  TO INJECT FIVE TIMES DAILY AS DIRECTED 450 each 1  . Insulin Pen Needle 32G X 8 MM MISC Use as directed 100 each 0  . insulin regular human  CONCENTRATED (HUMULIN R U-500 KWIKPEN) 500 UNIT/ML kwikpen INJECT SUBCUTANEOUSLY 15 UNITS AT BREAKFAST, 25 UNITS AT LUNCH AND 25-30 UNITS BEFORE SUPPER 18 mL 0  . Lancets (ACCU-CHEK MULTICLIX) lancets Check blood sugar four times daily 200 each 12  . midodrine (PROAMATINE) 10 MG tablet Take 10 mg by mouth as needed (low blood pressure).     Marland Kitchen omeprazole (PRILOSEC) 20 MG capsule TAKE 1 CAPSULE EVERY DAY 90 capsule 2  . RENVELA 800 MG tablet Take 2,400 mg by mouth 3 (three) times daily with meals.     . rosuvastatin (CRESTOR) 20 MG tablet TAKE 1 TABLET AT BEDTIME 90 tablet 0  . traMADol (ULTRAM) 50 MG tablet Take 1 tablet (50 mg total) by mouth every 8 (eight) hours as needed. 30 tablet 0  . VELTASSA 8.4 g packet Take 8.4 g by mouth daily.     Marland Kitchen warfarin (COUMADIN) 5 MG tablet Take 1 (5 mg) tablet every day except for Wednesday & Saturday when you take 7.17m (1.5 tablets) OR As directed. 105 tablet 0    Allergies as of 04/11/2018 - Review Complete 04/11/2018  Allergen Reaction Noted  . Pork-derived products Anaphylaxis and Other (See Comments) 10/21/2014  . Ace inhibitors Cough   . Hydrocodone Other (See Comments) 11/15/2015    Family History  Problem Relation Age of Onset  . Hypertension Mother   . Diabetes Father     Social History   Socioeconomic History  . Marital status: Divorced    Spouse name: Not on file  . Number of children: 0  . Years of education: College   . Highest  education level: Not on file  Occupational History    Employer: UNEMPLOYED  Social Needs  . Financial resource strain: Not on file  . Food insecurity:    Worry: Not on file    Inability: Not on file  . Transportation needs:    Medical: Not on file    Non-medical: Not on file  Tobacco Use  . Smoking status: Never Smoker  . Smokeless tobacco: Never Used  Substance and Sexual Activity  . Alcohol use: No    Alcohol/week: 0.0 oz  . Drug use: No  . Sexual activity: Not Currently  Lifestyle  .  Physical activity:    Days per week: Not on file    Minutes per session: Not on file  . Stress: Not on file  Relationships  . Social connections:    Talks on phone: Not on file    Gets together: Not on file    Attends religious service: Not on file    Active member of club or organization: Not on file    Attends meetings of clubs or organizations: Not on file    Relationship status: Not on file  . Intimate partner violence:    Fear of current or ex partner: Not on file    Emotionally abused: Not on file    Physically abused: Not on file    Forced sexual activity: Not on file  Other Topics Concern  . Not on file  Social History Narrative   Patient is Divorced.   Patient does not have any children.   Patient is right-handed   Patient is currently in college working on his Ph.D.       Review of Systems: Gen: Denies any fever, chills, sweats, anorexia, fatigue, weakness, malaise, weight loss, and sleep disorder HEENT: No visual complaints, No history of Retinopathy. Normal external appearance No Epistaxis or Sore throat. No sinusitis.   CV: Denies chest pain, angina, palpitations, syncope, orthopnea, PND, peripheral edema, and claudication.  History of endocarditis  Resp: Denies dyspnea at rest, dyspnea with exercise, cough, sputum, wheezing, coughing up blood, and pleurisy. GI: Denies vomiting blood, jaundice, and fecal incontinence.   Denies dysphagia or odynophagia. GU :  Groin soreness with abscess  MS: Denies joint pain, limitation of movement, and swelling, stiffness, low back pain, extremity pain. Denies muscle weakness, cramps, atrophy.  No use of non steroidal antiinflammatory drugs. Derm: Denies rash, itching, dry skin, hives, moles, warts, or unhealing ulcers.  Psych: Denies depression, anxiety, memory loss, suicidal ideation, hallucinations, paranoia, and confusion. Heme: Denies bruising, bleeding, and enlarged lymph nodes. Neuro: No headache.  No diplopia. No  dysarthria.  No dysphasia.  No history of CVA.  No Seizures. No paresthesias.  No weakness. Endocrine   DM.  No Thyroid disease.  No Adrenal disease.  Physical Exam: Vital signs in last 24 hours: Temp:  [97.7 F (36.5 C)-100.5 F (38.1 C)] 97.7 F (36.5 C) (07/01 1708) Pulse Rate:  [64-129] 64 (07/01 1830) Resp:  [11-22] 15 (07/01 1830) BP: (79-170)/(35-82) 116/46 (07/01 1830) SpO2:  [86 %-98 %] 91 % (07/01 1830) Weight:  [309 lb (140.2 kg)] 309 lb (140.2 kg) (07/01 1220)   General:   Alert,  Well-developed, well-nourished, pleasant and cooperative in NAD Head:  Normocephalic and atraumatic. Eyes:  Sclera clear, no icterus.   Conjunctiva pink. Ears:  Normal auditory acuity. Nose:  No deformity, discharge,  or lesions. Mouth:  No deformity or lesions, dentition normal. Neck:  Supple; no masses or thyromegaly. JVP  not elevated Lungs:  Clear throughout to auscultation.   No wheezes, crackles, or rhonchi. No acute distress. Heart:  Regular rate and rhythm; no murmurs, clicks, rubs,  or gallops. Abdomen:  Soft, nontender and nondistended. No masses, hepatosplenomegaly or hernias noted. Normal bowel sounds, without guarding, and without rebound.   Msk:  Symmetrical without gross deformities. Normal posture. Pulses:  No carotid, renal, femoral bruits. DP and PT symmetrical and equal Extremities:  Without clubbing or edema. Neurologic:  Alert and  oriented x4;  grossly normal neurologically. Skin:  Groin abscess     Intake/Output from previous day: No intake/output data recorded. Intake/Output this shift: No intake/output data recorded.  Lab Results: Recent Labs    04/11/18 1257  WBC 9.2  HGB 14.5  HCT 45.6  PLT 192   BMET Recent Labs    04/11/18 1257  NA 134*  K 7.1*  CL 92*  CO2 27  GLUCOSE 355*  BUN 58*  CREATININE 11.48*  CALCIUM 8.7*   LFT Recent Labs    04/11/18 1257  PROT 7.4  ALBUMIN 3.5  AST 12*  ALT 9  ALKPHOS 54  BILITOT 1.3*   PT/INR Recent  Labs    04/11/18 1257  LABPROT 19.8*  INR 1.70   Hepatitis Panel No results for input(s): HEPBSAG, HCVAB, HEPAIGM, HEPBIGM in the last 72 hours.  Studies/Results: Ct Abdomen Pelvis Wo Contrast  Result Date: 04/11/2018 CLINICAL DATA:  Right lower quadrant pain. Abdominal distention. Perirectal abscess. EXAM: CT ABDOMEN AND PELVIS WITHOUT CONTRAST TECHNIQUE: Multidetector CT imaging of the abdomen and pelvis was performed following the standard protocol without IV contrast. COMPARISON:  CT 12/30/2009.  Ultrasound 10/14/2015 FINDINGS: Lower chest: Linear scarring at the left base.  No effusions. Hepatobiliary: Layering stones within the gallbladder. No focal hepatic abnormality. No biliary ductal dilatation. Pancreas: No focal abnormality or ductal dilatation. Spleen: No focal abnormality.  Normal size. Adrenals/Urinary Tract: Severely atrophic kidneys bilaterally compatible with end-stage renal disease. Multiple cysts. Low-density mass in the mid to lower pole of the right kidney measures up to 6.1 cm, likely a cyst, but cannot be fully characterized on this noncontrast study. Right lower quadrant renal transplant noted with severe cortical thinning. No hydronephrosis. 2.3 cm low-density nodule in the left adrenal gland compatible with adenoma. This is stable. Stomach/Bowel: Stomach, large and small bowel grossly unremarkable. Vascular/Lymphatic: Diffuse aortic and branch vessel atherosclerosis. No aneurysm or adenopathy. Reproductive: No visible focal abnormality. Other: No free fluid or free air. Umbilical and paraumbilical ventral hernias containing fat. Musculoskeletal: No acute bony abnormality. IMPRESSION: Severely atrophic native kidneys compatible with end-stage renal disease. Right lower quadrant renal transplant demonstrates severe cortical thinning. Low-density lesions throughout the native kidneys likely reflects cysts although these cannot be characterized on this noncontrast study. Small  layering gallstones within the gallbladder. Umbilical and paraumbilical ventral hernias containing fat. Aortic atherosclerosis. Electronically Signed   By: Rolm Baptise M.D.   On: 04/11/2018 14:16   Dg Chest 2 View  Result Date: 04/11/2018 CLINICAL DATA:  Right-sided abdominal pain and cough for 2 days EXAM: CHEST - 2 VIEW COMPARISON:  12/15/2016 FINDINGS: Chronic cardiomegaly vascular pedicle widening. Chronic interstitial coarsening. There is no edema, consolidation, effusion, or pneumothorax. IMPRESSION: 1. No acute finding when compared to prior. 2. Chronic cardiomegaly. Electronically Signed   By: Monte Fantasia M.D.   On: 04/11/2018 14:03    Assessment/Plan: 1. End stage renal disease  Usual day TTS  Brought to the ER with groin abscess will proceed  with urgent dialysis due to the presence of hyperkalemia  Patient was seen in dialysis receiving his treatment 2. Anemia no issues at this time 3. Bones uncontrolled hyperphosphatemia  Binders renvela 3 and phoslo 3 tabs with meals  IV hectoral 4 mcg  With dialysis TTS 4. Hyperkalemia  Long standing problem  Valtessa 8.4 g one packet qd no apparent issues with his access although there was decrease in access flow from 2000 - 1126    Last PTA  Nov 2018  5. Diabetes per primary team 6. Aortic insufficiency mild  7. Failed renal transplant   2016  He has pain over transplant curious if this could be rejecting - he is on no antirejection medication and it would be unusual after 3 years  - nothing to really speak of on CT scan today - he may need transplant nephrectomy if pain does not settle  8. History of DVT   Long history anticoagulation     LOS: 0 Averey Koning W _0 _1 :32 PM

## 2018-04-12 ENCOUNTER — Other Ambulatory Visit: Payer: Self-pay

## 2018-04-12 DIAGNOSIS — I4892 Unspecified atrial flutter: Secondary | ICD-10-CM | POA: Diagnosis not present

## 2018-04-12 DIAGNOSIS — Z940 Kidney transplant status: Secondary | ICD-10-CM | POA: Diagnosis not present

## 2018-04-12 DIAGNOSIS — E1122 Type 2 diabetes mellitus with diabetic chronic kidney disease: Secondary | ICD-10-CM | POA: Diagnosis not present

## 2018-04-12 DIAGNOSIS — Z6841 Body Mass Index (BMI) 40.0 and over, adult: Secondary | ICD-10-CM | POA: Diagnosis not present

## 2018-04-12 DIAGNOSIS — T8612 Kidney transplant failure: Secondary | ICD-10-CM | POA: Diagnosis not present

## 2018-04-12 DIAGNOSIS — D631 Anemia in chronic kidney disease: Secondary | ICD-10-CM | POA: Diagnosis not present

## 2018-04-12 DIAGNOSIS — E875 Hyperkalemia: Secondary | ICD-10-CM | POA: Diagnosis not present

## 2018-04-12 DIAGNOSIS — Z992 Dependence on renal dialysis: Secondary | ICD-10-CM | POA: Diagnosis not present

## 2018-04-12 DIAGNOSIS — I132 Hypertensive heart and chronic kidney disease with heart failure and with stage 5 chronic kidney disease, or end stage renal disease: Secondary | ICD-10-CM | POA: Diagnosis not present

## 2018-04-12 DIAGNOSIS — N186 End stage renal disease: Secondary | ICD-10-CM | POA: Diagnosis not present

## 2018-04-12 LAB — BASIC METABOLIC PANEL
Anion gap: 12 (ref 5–15)
BUN: 48 mg/dL — ABNORMAL HIGH (ref 6–20)
CO2: 27 mmol/L (ref 22–32)
Calcium: 8.7 mg/dL — ABNORMAL LOW (ref 8.9–10.3)
Chloride: 96 mmol/L — ABNORMAL LOW (ref 98–111)
Creatinine, Ser: 10.47 mg/dL — ABNORMAL HIGH (ref 0.61–1.24)
GFR calc Af Amer: 6 mL/min — ABNORMAL LOW (ref >60)
GFR calc non Af Amer: 5 mL/min — ABNORMAL LOW (ref >60)
Glucose, Bld: 265 mg/dL — ABNORMAL HIGH (ref 70–99)
Potassium: 6.1 mmol/L — ABNORMAL HIGH (ref 3.5–5.1)
Sodium: 135 mmol/L (ref 135–145)

## 2018-04-12 LAB — CBC
HCT: 44 % (ref 39.0–52.0)
Hemoglobin: 13.4 g/dL (ref 13.0–17.0)
MCH: 29.2 pg (ref 26.0–34.0)
MCHC: 30.5 g/dL (ref 30.0–36.0)
MCV: 95.9 fL (ref 78.0–100.0)
Platelets: 162 10*3/uL (ref 150–400)
RBC: 4.59 MIL/uL (ref 4.22–5.81)
RDW: 17.3 % — ABNORMAL HIGH (ref 11.5–15.5)
WBC: 6.8 10*3/uL (ref 4.0–10.5)

## 2018-04-12 LAB — GLUCOSE, CAPILLARY
Glucose-Capillary: 167 mg/dL — ABNORMAL HIGH (ref 70–99)
Glucose-Capillary: 203 mg/dL — ABNORMAL HIGH (ref 70–99)
Glucose-Capillary: 336 mg/dL — ABNORMAL HIGH (ref 70–99)

## 2018-04-12 LAB — PROTIME-INR
INR: 1.68
Prothrombin Time: 19.6 seconds — ABNORMAL HIGH (ref 11.4–15.2)

## 2018-04-12 LAB — MRSA PCR SCREENING: MRSA by PCR: NEGATIVE

## 2018-04-12 MED ORDER — DIPHENHYDRAMINE HCL 25 MG PO CAPS
25.0000 mg | ORAL_CAPSULE | Freq: Once | ORAL | Status: AC
  Administered 2018-04-12: 25 mg via ORAL
  Filled 2018-04-12: qty 1

## 2018-04-12 MED ORDER — DOXYCYCLINE HYCLATE 100 MG PO TABS: 100.0000 mg | ORAL_TABLET | Freq: Two times a day (BID) | ORAL | 0 refills | Status: AC

## 2018-04-12 MED ORDER — WARFARIN SODIUM 5 MG PO TABS
5.0000 mg | ORAL_TABLET | Freq: Once | ORAL | Status: AC
  Administered 2018-04-12: 5 mg via ORAL
  Filled 2018-04-12: qty 1

## 2018-04-12 MED ORDER — CEFAZOLIN SODIUM-DEXTROSE 2-4 GM/100ML-% IV SOLN
2.0000 g | INTRAVENOUS | Status: DC
  Administered 2018-04-12: 2 g via INTRAVENOUS
  Filled 2018-04-12: qty 100

## 2018-04-12 MED ORDER — SODIUM CHLORIDE 0.9 % IV SOLN: 100.0000 mL | INTRAVENOUS | Status: DC | PRN

## 2018-04-12 MED ORDER — CHLORHEXIDINE GLUCONATE CLOTH 2 % EX PADS: 6.0000 | MEDICATED_PAD | Freq: Every day | CUTANEOUS | Status: DC

## 2018-04-12 MED ORDER — MIDODRINE HCL 5 MG PO TABS
ORAL_TABLET | ORAL | Status: AC
  Filled 2018-04-12: qty 2

## 2018-04-12 MED ORDER — CEPHALEXIN 500 MG PO CAPS: 500.0000 mg | ORAL_CAPSULE | Freq: Two times a day (BID) | ORAL | 0 refills | Status: DC

## 2018-04-12 MED ORDER — CLONAZEPAM 1 MG PO TABS: 1.0000 mg | ORAL_TABLET | Freq: Once | ORAL | Status: DC | PRN

## 2018-04-12 NOTE — Progress Notes (Signed)
[facility] Progress Note   Subjective:  Sitting up in bed, eating breakfast. Has no complaints.  Had urgent HD last night for hyperkalemia (3.5hours) K 6.1 this am   Objective Vitals:   04/11/18 2230 04/11/18 2256 04/11/18 2342 04/12/18 0526  BP: (!) 112/50 115/87 (!) 119/58 (!) 118/51  Pulse: (!) 110 (!) 105 (!) 134 89  Resp: 18 18 18 16   Temp:  98.6 F (37 C) 98.2 F (36.8 C) 98.3 F (36.8 C)  TempSrc:   Oral Oral  SpO2:  96% 95% 93%  Weight:  (!) 138 kg (304 lb 3.8 oz)    Height:       Physical Exam General: Obese male sitting up in bed  Heart: RRR  Lungs: CTAB Abdomen: obese soft NT  Extremities: Trace LE edema  Dialysis Access: LUE   Dialysis Orders:  [place] TTS  Time 4 30  180 NR  450 / 800  EDW 137 Kg  2/2.5 Bath                  No heparin   AVF R                  IV hectoral 4 mcg     Assessment/Plan: 1. Hyperkalemia - Admitted with K 7.1.  On daily Veltassa as outpatient for chronically elevated K. No reported issues with access. Last access flow 1126 decreased from 2000.   Had urgent HD last night. K 6.1 this am. Correct with HD today  2. ESRD - TTS. Abbreviated HD last night for ^K. Plan for full treatment today on schedule.  3. L groin abscess. S/p I&D. Cultures pending. IV Vanc/cefazolin per primary -->plan to change to PO 4. Anemia - Hgb 13.4 No ESA needs  5. MBD- Ca ok. Continue Hectorol/Renvela binder  6. HTN/volume - BP controlled. At EDW. No gross volume overload on exam  7. DM 8. Aortic insufficiency 9. Failed renal transplant 2016. No immunosuppressants  10. H/x DVT  On Coumadin    Lynnda Child PA-C [facility] Pager [phone] 04/12/2018,9:31 AM  LOS: 1 day   Additional Objective Labs: Basic Metabolic Panel: Recent Labs  Lab 04/11/18 1257 04/12/18 0503  NA 134* 135  K 7.1* 6.1*  CL 92* 96*  CO2 27 27  GLUCOSE 355* 265*  BUN 58* 48*  CREATININE 11.48* 10.47*  CALCIUM 8.7* 8.7*    CBC: Recent Labs  Lab 04/11/18 1257 04/12/18 0503  WBC 9.2 6.8  NEUTROABS 7.3  --   HGB 14.5 13.4  HCT 45.6 44.0  MCV 93.6 95.9  PLT 192 162   Blood Culture    Component Value Date/Time   SDES  04/11/2018 1446    ABSCESS GROIN LEFT Performed at [facility], [place] [address] [place], [place] [address]    [facility]  04/11/2018 1446    Immunocompromised Performed at [facility], [address] [place], [place] [address]    CULT PENDING 04/11/2018 1446   REPTSTATUS PENDING 04/11/2018 1446    Cardiac Enzymes: Recent Labs  Lab 04/11/18 1257  TROPONINI 0.08*   CBG: Recent Labs  Lab 04/11/18 1316 04/11/18 1425 04/11/18 1548 04/12/18 0000 04/12/18 0733  GLUCAP 344* 362* 357* 336* 167*   Iron Studies: No results for input(s): IRON, TIBC, TRANSFERRIN, FERRITIN in the last 72 hours. Lab Results  Component Value Date   INR 1.68 04/12/2018   INR 1.70 04/11/2018   INR 3.6 (  A) 03/31/2018   PROTIME 26.1 (A) 06/18/2017   PROTIME 23.6 (A) 06/10/2017   PROTIME 22.8 (A) 06/03/2017   Medications: . sodium chloride    . sodium chloride    . anticoagulant sodium citrate    .  ceFAZolin (ANCEF) IV    . vancomycin     . calcium acetate  2,001 mg Oral TID WC  . febuxostat  80 mg Oral Daily  . insulin aspart  0-15 Units Subcutaneous TID WC  . insulin aspart  0-5 Units Subcutaneous QHS  . insulin glargine  70 Units Subcutaneous QHS  . insulin glargine  80 Units Subcutaneous Daily  . lidocaine  10 mL Infiltration Once  . pantoprazole  40 mg Oral Daily  . patiromer  8.4 g Oral Daily  . rosuvastatin  20 mg Oral QHS  . sevelamer carbonate  2,400 mg Oral TID WC  . warfarin  5 mg Oral ONCE-1800  . Warfarin - Pharmacist Dosing Inpatient   Does not apply [phone]

## 2018-04-12 NOTE — Discharge Instructions (Signed)
1) take doxycycline and cephalexin antibiotics as prescribed for left groin infection 2) follow-up with your doctor within 3 days  for recheck and for your doctor to check the results of your wound culture and adjust antibiotics as needed 3) continue hemodialysis on Tuesdays, Thursdays and Saturdays   per your usual schedule 4) Recheck PT/INR in 3 to 5 days  Please note   You were cared for by a hospitalist Physician during your hospital stay. Once you are discharged, your primary care physician will handle any further medical issues. Please note that NO REFILLS for any discharge medications will be authorized once you are discharged, as it is imperative that you return to your primary care physician (or establish a relationship with a primary care physician if you do not have one) for your aftercare needs so that they can reassess your need for medications and monitor your lab values

## 2018-04-12 NOTE — Progress Notes (Signed)
ANTICOAGULATION CONSULT NOTE - Follow Up Consult  Pharmacy Consult for Coumadin Indication: atrial fibrillation  Allergies  Allergen Reactions  . Pork-Derived Products Anaphylaxis and Other (See Comments)    Fever also   . Ace Inhibitors Cough  . Hydrocodone Other (See Comments)    Hallucinations    Patient Measurements: Height: 5\' 10"  (177.8 cm) Weight: (!) 304 lb 3.8 oz (138 kg) IBW/kg (Calculated) : 73  Vital Signs: Temp: 98.3 F (36.8 C) (07/02 0526) Temp Source: Oral (07/02 0526) BP: 118/51 (07/02 0526) Pulse Rate: 89 (07/02 0526)  Labs: Recent Labs    04/11/18 1257 04/12/18 0503  HGB 14.5 13.4  HCT 45.6 44.0  PLT 192 162  LABPROT 19.8* 19.6*  INR 1.70 1.68  CREATININE 11.48* 10.47*  TROPONINI 0.08*  --     Estimated Creatinine Clearance: 11.3 mL/min (A) (by C-G formula based on SCr of 10.47 mg/dL (H)).   Assessment: 52 yo M presents on 7/1 with inguinal abscess. The patient was on warfarin PTA for hx DVT and also found to have new Aflutter this admit. Admit INR 1.7 on PTA dose of Coumadin 5mg  daily exc for 2.5mg  on Wed. Pharmacy consulted to resume dosing this admit.   INR this morning remains SUBtherapeutic (INR 1.68 << 1.7, goal of 2-3) however the dose of warfarin intended for 7/1 was given early 7/2 AM so full effects on INR have yet to be seen. CBC wnl. No bleeding noted.   Goal of Therapy:  INR 2-3 Monitor platelets by anticoagulation protocol: Yes   Plan:  - Repeat Warfarin 5 mg x 1 dose at 1800 today - Will continue to monitor for any signs/symptoms of bleeding and will follow up with PT/INR in the a.m.   Thank you for allowing pharmacy to be a part of this patient's care.  Alycia Rossetti, PharmD, BCPS Clinical Pharmacist Pager: [phone] Clinical phone for 04/12/2018 from 7a-3:30p: [phone] If after 3:30p, please call main pharmacy at: [phone] 04/12/2018 8:30 AM

## 2018-04-12 NOTE — Procedures (Signed)
Patient seen on Hemodialysis. QB 400, UF goal 2.5L Treatment adjusted as needed.  Elmarie Shiley MD [facility]. Office # [phone] Pager # [phone] 12:00 PM

## 2018-04-12 NOTE — Discharge Summary (Signed)
[name] [name], is a 52 y.o. male  DOB [birth year]  MRN [id number].  Admission date:  04/11/2018  Admitting Physician  Merton Border, MD  Discharge Date:  04/12/2018   Primary MD  Hoyt Koch, MD  Recommendations for primary care physician for things to follow:   1) take doxycycline and cephalexin antibiotics as prescribed for left groin infection 2) follow-up with your doctor within 3 days  for recheck and for your doctor to check the results of your wound culture and adjust antibiotics as needed 3) continue hemodialysis on Tuesdays, Thursdays and Saturdays   per your usual schedule 4) Recheck PT/INR in 3 to 5 days  Admission Diagnosis  Hyperkalemia [E87.5] Cutaneous abscess of groin [L02.214] Sepsis, due to unspecified organism [facility] [A41.9]   Discharge Diagnosis  Hyperkalemia [E87.5] Cutaneous abscess of groin [L02.214] Sepsis, due to unspecified organism ([place]) [A41.9]    Active Problems:   Hyperkalemia      Past Medical History:  Diagnosis Date  . Allergic rhinitis   . Anemia   . Aortic insufficiency    a. Echo 7/16:  Mod LVH, EF 50-55%, mild to mod AI, severe LAE, PASP 57 mmHg (LV ID end diastolic 71.0 mm);  b. Echo 2/17:  Mod LVH, EF 50-55%, mod AI, MAC, mild MR, mod LAE, mild to mod TR, PASP 63 mmHg  . Bilateral carpal tunnel syndrome   . CHF (congestive heart failure) ([place])   . Claustrophobia   . COPD (chronic obstructive pulmonary disease) ([place])   . Diabetes mellitus    Type II  . Diabetic peripheral neuropathy ([place])   . Diabetic retinopathy ([place])   . Discitis of lumbar region    resolved  . DVT (deep venous thrombosis) ([place]) 10/07/2015   LLE  . Endocarditis 11/28/14  . ESRD (end stage renal disease) on dialysis [facility])    s/p renal transplant in 2009, failed in 2016  . Gait disorder   . Gastroparesis   . GERD (gastroesophageal reflux disease)   . Gout   . Hearing  difficulty   . Hyperlipidemia   . Hypertension   . Incomplete bladder emptying   . Leg pain 02/05/11   with walking  . Morbid obesity ([place])   . Obstructive sleep apnea    not using CPAP- lost 100lbs  . Osteomyelitis ([place])   . Posttraumatic stress disorder   . Rotator cuff disorder   . Secondary hyperparathyroidism ([place])   . Sepsis ([place])    Postive blood cultures -   . Urethral stricture   . Vitamin D deficiency     Past Surgical History:  Procedure Laterality Date  . ARTERIOVENOUS GRAFT PLACEMENT Bilateral "several"  . AV FISTULA PLACEMENT Bilateral 2015  . BASCILIC VEIN TRANSPOSITION Right 09/27/2015  . [place] Right 09/27/2015   Procedure: BASILIC VEIN TRANSPOSITION right;  Surgeon: Mal Misty, MD;  Location: [place];  Service: Vascular;  Laterality: Right;  . CATARACT EXTRACTION W/ INTRAOCULAR LENS  IMPLANT, BILATERAL Bilateral   . CYSTOSCOPY W/  INTERNAL URETHROTOMY  09/2014  . KIDNEY TRANSPLANT  2009       HPI  from the history and physical done on the day of admission:    HPI  [name] [name]  is a 51 y.o. male, with past medical history significant for end-stage renal disease status post failed kidney transplants in the past  on hemodialysis, history of aortic insufficiency, history of endocarditis, discitis who presented to the emergency room today at [facility] due to bilateral groin pain.  The left groin pain was noticed for the last week or so with the left groin spot which was growing progressively and becoming painful.  Patient had a CT scan and was negative for intra-abdominal abscess, and had I &D of a left groin cutaneous abscess.  Patient still reports right lower abdominal pain, denies any history of fever/chills/diarrhea, nausea or vomiting.  During his stay , the patient was noted to be hyperkalemic and was  transferred to our hospital for hemodialysis.  In the emergency room the patient was also noted to be hypoxic and  tachycardic and was started on IV antibiotics.   Hospital Course:   1) Hyperkalemia: Addressed with hemodialysis at this time.  Seen by Dr. Justin Mend and Dr. Posey Pronto from nephrology, compliance with [facility] for management of hyperkalemia strongly encouraged  2)End-stage renal disease on hemodialysis TTS--- stable, patient had hemodialysis on 04/11/2018 due to hyperkalemia, patient had a repeat hemodialysis on 04/12/2018 per usual schedule  3) Left groin cutaneous abscess status post I & D in the emergency room on 04/11/18--patient was treated with IV vancomycin for swelling, okay to discharge home on p.o. doxycycline and Keflex, follow-up with PCP within 3 days  For wound recheck and for   the results of  wound culture and adjust antibiotics as needed  4)DM2-recent A1c was 7.9,-stable, continue home insulin regimen    5) Atrial flutter- ???  Intermittent/paroxysmal atrial arrhythmia, patient is ready on Coumadin for anticoagulation.  Currently appears to be in sinus rhythm, rate controlled   Patient has a history of aortic insufficiency and congestive heart failure.     Echocardiogram reviewed and showed normal systolic function with grade 3 diastolic dysfunction.  6) History of  DVT--continue Coumadin, Recheck INR in 3 to 5 days  Discharge Condition: stable  Follow UP- Recheck PT/INR in 3 to 5 days   Consults obtained - Nephrology  Diet and Activity recommendation:  As advised  Discharge Instructions    Discharge Instructions    Call MD for:  difficulty breathing, headache or visual disturbances   Complete by:  As directed    Call MD for:  persistant dizziness or light-headedness   Complete by:  As directed    Call MD for:  persistant nausea and vomiting   Complete by:  As directed    Call MD for:  redness, tenderness, or signs of infection (pain, swelling, redness, odor or green/yellow discharge around incision site)   Complete by:  As directed    Call MD for:  severe uncontrolled pain    Complete by:  As directed    Call MD for:  temperature >100.4   Complete by:  As directed    Diet - low sodium heart healthy   Complete by:  As directed    Discharge instructions   Complete by:  As directed    1) take doxycycline and cephalexin antibiotics as prescribed for left groin infection 2) follow-up with your doctor within 3 days  for recheck  and for your doctor to check the results of your wound culture and adjust antibiotics as needed 3) continue hemodialysis on Tuesdays, Thursdays and Saturdays   per your usual schedule 4) Recheck PT/INR in 3 to 5 days   Increase activity slowly   Complete by:  As directed         Discharge Medications     Allergies as of 04/12/2018      Reactions   Pork-derived Products Anaphylaxis, Other (See Comments)   Fever also    Ace Inhibitors Cough   Hydrocodone Other (See Comments)   Hallucinations      Medication List    TAKE these medications   accu-chek multiclix lancets Check blood sugar four times daily   calcium acetate 667 MG capsule Commonly known as:  PHOSLO Take 3 capsules (2,001 mg total) by mouth 3 (three) times daily with meals.   cephALEXin 500 MG capsule Commonly known as:  KEFLEX Take 1 capsule (500 mg total) by mouth 2 (two) times daily.   clonazePAM 1 MG tablet Commonly known as:  KLONOPIN   doxycycline 100 MG tablet Commonly known as:  VIBRA-TABS Take 1 tablet (100 mg total) by mouth 2 (two) times daily for 10 days.   Febuxostat 80 MG Tabs Commonly known as:  ULORIC Take 1 tablet (80 mg total) by mouth daily.   glucose blood test strip Commonly known as:  ACCU-CHEK GUIDE Check blood sugar four times daily   hydrocortisone 25 MG suppository Commonly known as:  ANUSOL-HC Place 1 suppository (25 mg total) rectally 2 (two) times daily.   hydrocortisone cream 1 % Apply to affected area 2 times daily   hydrocortisone-pramoxine rectal foam Commonly known as:  PROCTOFOAM-HC Place 1 applicator rectally 3  (three) times daily. Prn hemorrhoids   Insulin Glargine 300 UNIT/ML Sopn Commonly known as:  TOUJEO SOLOSTAR Inject 70-80 Units into the skin 2 (two) times daily. Inject 80 units in the morning and inject 70 units in the evening.   Insulin Pen Needle 32G X 8 MM Misc Use as directed   Insulin Pen Needle 32G X 4 MM Misc Commonly known as:  BD PEN NEEDLE NANO U/F USE  TO INJECT FIVE TIMES DAILY AS DIRECTED   insulin regular human CONCENTRATED 500 UNIT/ML kwikpen Commonly known as:  HUMULIN R U-500 KWIKPEN INJECT SUBCUTANEOUSLY 15 UNITS AT BREAKFAST, 25 UNITS AT LUNCH AND 25-30 UNITS BEFORE SUPPER   midodrine 10 MG tablet Commonly known as:  PROAMATINE Take 10 mg by mouth as needed (low blood pressure).   omeprazole 20 MG capsule Commonly known as:  PRILOSEC TAKE 1 CAPSULE EVERY DAY   RENVELA 800 MG tablet Generic drug:  sevelamer carbonate Take 2,400 mg by mouth 3 (three) times daily with meals.   rosuvastatin 20 MG tablet Commonly known as:  CRESTOR TAKE 1 TABLET AT BEDTIME   traMADol 50 MG tablet Commonly known as:  ULTRAM Take 1 tablet (50 mg total) by mouth every 8 (eight) hours as needed.   VELTASSA 8.4 g packet Generic drug:  patiromer Take 8.4 g by mouth daily.   warfarin 5 MG tablet Commonly known as:  COUMADIN Take as directed. If you are unsure how to take this medication, talk to your nurse or doctor. Original instructions:  Take 1 (5 mg) tablet every day except for Wednesday & Saturday when you take 7.58m (1.5 tablets) OR As directed.       Major procedures and Radiology Reports - PLEASE review detailed and  final reports for all details, in brief -   Ct Abdomen Pelvis Wo Contrast  Result Date: 04/11/2018 CLINICAL DATA:  Right lower quadrant pain. Abdominal distention. Perirectal abscess. EXAM: CT ABDOMEN AND PELVIS WITHOUT CONTRAST TECHNIQUE: Multidetector CT imaging of the abdomen and pelvis was performed following the standard protocol without IV  contrast. COMPARISON:  CT 12/30/2009.  Ultrasound 10/14/2015 FINDINGS: Lower chest: Linear scarring at the left base.  No effusions. Hepatobiliary: Layering stones within the gallbladder. No focal hepatic abnormality. No biliary ductal dilatation. Pancreas: No focal abnormality or ductal dilatation. Spleen: No focal abnormality.  Normal size. Adrenals/Urinary Tract: Severely atrophic kidneys bilaterally compatible with end-stage renal disease. Multiple cysts. Low-density mass in the mid to lower pole of the right kidney measures up to 6.1 cm, likely a cyst, but cannot be fully characterized on this noncontrast study. Right lower quadrant renal transplant noted with severe cortical thinning. No hydronephrosis. 2.3 cm low-density nodule in the left adrenal gland compatible with adenoma. This is stable. Stomach/Bowel: Stomach, large and small bowel grossly unremarkable. Vascular/Lymphatic: Diffuse aortic and branch vessel atherosclerosis. No aneurysm or adenopathy. Reproductive: No visible focal abnormality. Other: No free fluid or free air. Umbilical and paraumbilical ventral hernias containing fat. Musculoskeletal: No acute bony abnormality. IMPRESSION: Severely atrophic native kidneys compatible with end-stage renal disease. Right lower quadrant renal transplant demonstrates severe cortical thinning. Low-density lesions throughout the native kidneys likely reflects cysts although these cannot be characterized on this noncontrast study. Small layering gallstones within the gallbladder. Umbilical and paraumbilical ventral hernias containing fat. Aortic atherosclerosis. Electronically Signed   By: Rolm Baptise M.D.   On: 04/11/2018 14:16   Dg Chest 2 View  Result Date: 04/11/2018 CLINICAL DATA:  Right-sided abdominal pain and cough for 2 days EXAM: CHEST - 2 VIEW COMPARISON:  12/15/2016 FINDINGS: Chronic cardiomegaly vascular pedicle widening. Chronic interstitial coarsening. There is no edema, consolidation,  effusion, or pneumothorax. IMPRESSION: 1. No acute finding when compared to prior. 2. Chronic cardiomegaly. Electronically Signed   By: Monte Fantasia M.D.   On: 04/11/2018 14:03    Micro Results    Recent Results (from the past 240 hour(s))  Blood culture (routine x 2)     Status: None (Preliminary result)   Collection Time: 04/11/18 12:50 PM  Result Value Ref Range Status   Specimen Description BLOOD LEFT FOREARM  Final   Special Requests   Final    BOTTLES DRAWN AEROBIC AND ANAEROBIC Blood Culture adequate volume Performed at [facility], [address] [place], [place] [address]    Culture   Final    NO GROWTH < 24 HOURS Performed at [facility], [place] [address] [place], [place] [address]    Report Status PENDING  Incomplete  Blood culture (routine x 2)     Status: None (Preliminary result)   Collection Time: 04/11/18 12:56 PM  Result Value Ref Range Status   Specimen Description BLOOD LEFT FOREARM  Final   Special Requests   Final    BOTTLES DRAWN AEROBIC AND ANAEROBIC Blood Culture adequate volume Performed at [facility], [address] [place], [place] [address]    Culture PENDING  Incomplete   Report Status PENDING  Incomplete  Wound or Superficial Culture     Status: None (Preliminary result)   Collection Time: 04/11/18  2:46 PM  Result Value Ref Range Status   Specimen Description   Final    ABSCESS GROIN LEFT Performed at [facility], [place] [address], [place], [place] [address]    Special Requests   Final    Immunocompromised Performed at [facility], [address] [place], [place] [address]    Gram Stain   Final    RARE WBC PRESENT,BOTH PMN AND MONONUCLEAR RARE GRAM POSITIVE COCCI RARE GRAM VARIABLE ROD    Culture   Final    TOO YOUNG TO READ Performed at [facility], [place] [address], [place], [place] [address]    Report Status PENDING  Incomplete  MRSA PCR Screening     Status: None    Collection Time: 04/12/18 12:01 AM  Result Value Ref Range Status   MRSA by PCR NEGATIVE NEGATIVE Final    Comment:        The GeneXpert MRSA Assay (FDA approved for NASAL specimens only), is one component of a comprehensive MRSA colonization surveillance program. It is not intended to diagnose MRSA infection nor to guide or monitor treatment for MRSA infections. Performed at [facility], [place] [address], [place], [place] [address]        Today   Subjective    [name] [name] today has no new concerns, requesting discharge home, No fever  Or chills , No nausea, vomiting, diarrhea          Patient has been seen and examined prior to discharge   Objective   Blood pressure (!) 113/47, pulse 92, temperature 98.4 F (36.9 C), temperature source Oral, resp. rate 14, height 5' 10"  (1.778 m), weight (!) 138 kg (304 lb 3.8 oz), SpO2 95 %.   Intake/Output Summary (Last 24 hours) at 04/12/2018 1723 Last data filed at 04/12/2018 1548 Gross per 24 hour  Intake -  Output 2561 ml  Net -2561 ml    Exam Gen:- Awake Alert, obese in no apparent distress  HEENT:- [place].AT, No sclera icterus Neck-Supple Neck,No JVD,.  Lungs-  CTAB , good air movement CV- S1, S2 normal, actually regular Abd-  +ve B.Sounds, Abd Soft, No tenderness, increased truncal adiposity Extremity/Skin:- No  edema,   left upper extremity dialysis access Psych-affect is appropriate, oriented x3 Neuro-no new focal deficits, no tremors Lt Groin--- wound without significant drainage, there is slight induration/swelling and tenderness on palpation, status post I&D, dressing removed   Data Review   CBC w Diff:  Lab Results  Component Value Date   WBC 6.8 04/12/2018   HGB 13.4 04/12/2018   HCT 44.0 04/12/2018   PLT 162 04/12/2018   LYMPHOPCT 13 04/11/2018   MONOPCT 8 04/11/2018   EOSPCT 1 04/11/2018   BASOPCT 0 04/11/2018    CMP:  Lab Results  Component Value Date   NA 135 04/12/2018   K 6.1 (H)  04/12/2018   CL 96 (L) 04/12/2018   CO2 27 04/12/2018   BUN 48 (H) 04/12/2018   CREATININE 10.47 (H) 04/12/2018   PROT 7.4 04/11/2018   ALBUMIN 3.5 04/11/2018   BILITOT 1.3 (H) 04/11/2018   ALKPHOS 54 04/11/2018   AST 12 (L) 04/11/2018   ALT 9 04/11/2018  .   Total Discharge time is about 33 minutes  Roxan Hockey M.D on 04/12/2018 at 5:23 PM  [facility]   Office  [phone]  Voice Recognition Viviann Spare dictation system was used to create this note, attempts have been made to correct errors. Please contact the author with questions and/or clarifications.

## 2018-04-12 NOTE — Progress Notes (Signed)
Patient discharged to home, AVS reviewed including medications, IV removed, telebox returned.

## 2018-04-13 DIAGNOSIS — Z940 Kidney transplant status: Secondary | ICD-10-CM | POA: Diagnosis not present

## 2018-04-13 DIAGNOSIS — D631 Anemia in chronic kidney disease: Secondary | ICD-10-CM | POA: Diagnosis not present

## 2018-04-13 DIAGNOSIS — Z992 Dependence on renal dialysis: Secondary | ICD-10-CM | POA: Diagnosis not present

## 2018-04-13 DIAGNOSIS — N186 End stage renal disease: Secondary | ICD-10-CM | POA: Diagnosis not present

## 2018-04-13 DIAGNOSIS — E1122 Type 2 diabetes mellitus with diabetic chronic kidney disease: Secondary | ICD-10-CM | POA: Diagnosis not present

## 2018-04-13 DIAGNOSIS — E875 Hyperkalemia: Secondary | ICD-10-CM | POA: Diagnosis not present

## 2018-04-13 LAB — AEROBIC CULTURE W GRAM STAIN (SUPERFICIAL SPECIMEN): Culture: NORMAL

## 2018-04-13 LAB — AEROBIC CULTURE  (SUPERFICIAL SPECIMEN)

## 2018-04-14 DIAGNOSIS — N2581 Secondary hyperparathyroidism of renal origin: Secondary | ICD-10-CM | POA: Diagnosis not present

## 2018-04-14 DIAGNOSIS — I82492 Acute embolism and thrombosis of other specified deep vein of left lower extremity: Secondary | ICD-10-CM | POA: Diagnosis not present

## 2018-04-14 DIAGNOSIS — D509 Iron deficiency anemia, unspecified: Secondary | ICD-10-CM | POA: Diagnosis not present

## 2018-04-14 DIAGNOSIS — N186 End stage renal disease: Secondary | ICD-10-CM | POA: Diagnosis not present

## 2018-04-14 DIAGNOSIS — E1122 Type 2 diabetes mellitus with diabetic chronic kidney disease: Secondary | ICD-10-CM | POA: Diagnosis not present

## 2018-04-15 LAB — PROTIME-INR: INR: 1.7 — AB (ref <=1.1)

## 2018-04-16 DIAGNOSIS — E1122 Type 2 diabetes mellitus with diabetic chronic kidney disease: Secondary | ICD-10-CM | POA: Diagnosis not present

## 2018-04-16 DIAGNOSIS — N186 End stage renal disease: Secondary | ICD-10-CM | POA: Diagnosis not present

## 2018-04-16 DIAGNOSIS — D509 Iron deficiency anemia, unspecified: Secondary | ICD-10-CM | POA: Diagnosis not present

## 2018-04-16 DIAGNOSIS — N2581 Secondary hyperparathyroidism of renal origin: Secondary | ICD-10-CM | POA: Diagnosis not present

## 2018-04-16 LAB — CULTURE, BLOOD (ROUTINE X 2)
Culture: NO GROWTH
Culture: NO GROWTH
Special Requests: ADEQUATE
Special Requests: ADEQUATE

## 2018-04-19 DIAGNOSIS — E1122 Type 2 diabetes mellitus with diabetic chronic kidney disease: Secondary | ICD-10-CM | POA: Diagnosis not present

## 2018-04-19 DIAGNOSIS — D509 Iron deficiency anemia, unspecified: Secondary | ICD-10-CM | POA: Diagnosis not present

## 2018-04-19 DIAGNOSIS — N2581 Secondary hyperparathyroidism of renal origin: Secondary | ICD-10-CM | POA: Diagnosis not present

## 2018-04-19 DIAGNOSIS — N186 End stage renal disease: Secondary | ICD-10-CM | POA: Diagnosis not present

## 2018-04-21 DIAGNOSIS — N2581 Secondary hyperparathyroidism of renal origin: Secondary | ICD-10-CM | POA: Diagnosis not present

## 2018-04-21 DIAGNOSIS — E1129 Type 2 diabetes mellitus with other diabetic kidney complication: Secondary | ICD-10-CM | POA: Diagnosis not present

## 2018-04-21 DIAGNOSIS — D509 Iron deficiency anemia, unspecified: Secondary | ICD-10-CM | POA: Diagnosis not present

## 2018-04-21 DIAGNOSIS — E1122 Type 2 diabetes mellitus with diabetic chronic kidney disease: Secondary | ICD-10-CM | POA: Diagnosis not present

## 2018-04-21 DIAGNOSIS — N186 End stage renal disease: Secondary | ICD-10-CM | POA: Diagnosis not present

## 2018-04-21 DIAGNOSIS — I82492 Acute embolism and thrombosis of other specified deep vein of left lower extremity: Secondary | ICD-10-CM | POA: Diagnosis not present

## 2018-04-21 DIAGNOSIS — M109 Gout, unspecified: Secondary | ICD-10-CM | POA: Diagnosis not present

## 2018-04-22 LAB — PROTIME-INR: INR: 3.3 — AB (ref <=1.1)

## 2018-04-23 DIAGNOSIS — E1122 Type 2 diabetes mellitus with diabetic chronic kidney disease: Secondary | ICD-10-CM | POA: Diagnosis not present

## 2018-04-23 DIAGNOSIS — N186 End stage renal disease: Secondary | ICD-10-CM | POA: Diagnosis not present

## 2018-04-23 DIAGNOSIS — D509 Iron deficiency anemia, unspecified: Secondary | ICD-10-CM | POA: Diagnosis not present

## 2018-04-23 DIAGNOSIS — N2581 Secondary hyperparathyroidism of renal origin: Secondary | ICD-10-CM | POA: Diagnosis not present

## 2018-04-26 ENCOUNTER — Ambulatory Visit (INDEPENDENT_AMBULATORY_CARE_PROVIDER_SITE_OTHER): Payer: Medicare Other | Admitting: General Practice

## 2018-04-26 DIAGNOSIS — E1122 Type 2 diabetes mellitus with diabetic chronic kidney disease: Secondary | ICD-10-CM | POA: Diagnosis not present

## 2018-04-26 DIAGNOSIS — I82502 Chronic embolism and thrombosis of unspecified deep veins of left lower extremity: Secondary | ICD-10-CM

## 2018-04-26 DIAGNOSIS — N2581 Secondary hyperparathyroidism of renal origin: Secondary | ICD-10-CM | POA: Diagnosis not present

## 2018-04-26 DIAGNOSIS — N186 End stage renal disease: Secondary | ICD-10-CM | POA: Diagnosis not present

## 2018-04-26 DIAGNOSIS — Z7901 Long term (current) use of anticoagulants: Secondary | ICD-10-CM | POA: Diagnosis not present

## 2018-04-26 DIAGNOSIS — D509 Iron deficiency anemia, unspecified: Secondary | ICD-10-CM | POA: Diagnosis not present

## 2018-04-26 NOTE — Progress Notes (Signed)
Medical treatment/procedure(s) were performed by non-physician practitioner and as supervising physician I was immediately available for consultation/collaboration. I agree with above. Elizabeth A Crawford, MD  

## 2018-04-26 NOTE — Patient Instructions (Signed)
Pre visit review using our clinic review tool, if applicable. No additional management support is needed unless otherwise documented below in the visit note.  Hold coumadin today and then continue to take 1 tablet daily except 1/2 tablet on Wednesdays.   Re-check in 1 week. Instructions given to patient.  Dialysis patient.

## 2018-04-28 DIAGNOSIS — D509 Iron deficiency anemia, unspecified: Secondary | ICD-10-CM | POA: Diagnosis not present

## 2018-04-28 DIAGNOSIS — N186 End stage renal disease: Secondary | ICD-10-CM | POA: Diagnosis not present

## 2018-04-28 DIAGNOSIS — N2581 Secondary hyperparathyroidism of renal origin: Secondary | ICD-10-CM | POA: Diagnosis not present

## 2018-04-28 DIAGNOSIS — E1122 Type 2 diabetes mellitus with diabetic chronic kidney disease: Secondary | ICD-10-CM | POA: Diagnosis not present

## 2018-04-28 DIAGNOSIS — I82492 Acute embolism and thrombosis of other specified deep vein of left lower extremity: Secondary | ICD-10-CM | POA: Diagnosis not present

## 2018-04-30 DIAGNOSIS — D509 Iron deficiency anemia, unspecified: Secondary | ICD-10-CM | POA: Diagnosis not present

## 2018-04-30 DIAGNOSIS — E1122 Type 2 diabetes mellitus with diabetic chronic kidney disease: Secondary | ICD-10-CM | POA: Diagnosis not present

## 2018-04-30 DIAGNOSIS — N2581 Secondary hyperparathyroidism of renal origin: Secondary | ICD-10-CM | POA: Diagnosis not present

## 2018-04-30 DIAGNOSIS — N186 End stage renal disease: Secondary | ICD-10-CM | POA: Diagnosis not present

## 2018-05-02 ENCOUNTER — Other Ambulatory Visit: Payer: Self-pay

## 2018-05-02 MED ORDER — ONETOUCH VERIO W/DEVICE KIT: 1.0000 | PACK | Freq: Once | 0 refills | Status: AC

## 2018-05-02 MED ORDER — ONETOUCH LANCETS MISC: 0 refills | Status: DC

## 2018-05-02 MED ORDER — GLUCOSE BLOOD VI STRP: ORAL_STRIP | 0 refills | Status: DC

## 2018-05-03 DIAGNOSIS — N2581 Secondary hyperparathyroidism of renal origin: Secondary | ICD-10-CM | POA: Diagnosis not present

## 2018-05-03 DIAGNOSIS — D509 Iron deficiency anemia, unspecified: Secondary | ICD-10-CM | POA: Diagnosis not present

## 2018-05-03 DIAGNOSIS — E1122 Type 2 diabetes mellitus with diabetic chronic kidney disease: Secondary | ICD-10-CM | POA: Diagnosis not present

## 2018-05-03 DIAGNOSIS — N186 End stage renal disease: Secondary | ICD-10-CM | POA: Diagnosis not present

## 2018-05-05 DIAGNOSIS — E1122 Type 2 diabetes mellitus with diabetic chronic kidney disease: Secondary | ICD-10-CM | POA: Diagnosis not present

## 2018-05-05 DIAGNOSIS — N2581 Secondary hyperparathyroidism of renal origin: Secondary | ICD-10-CM | POA: Diagnosis not present

## 2018-05-05 DIAGNOSIS — N186 End stage renal disease: Secondary | ICD-10-CM | POA: Diagnosis not present

## 2018-05-05 DIAGNOSIS — D509 Iron deficiency anemia, unspecified: Secondary | ICD-10-CM | POA: Diagnosis not present

## 2018-05-05 DIAGNOSIS — I82492 Acute embolism and thrombosis of other specified deep vein of left lower extremity: Secondary | ICD-10-CM | POA: Diagnosis not present

## 2018-05-05 LAB — POCT INR: INR: 2.9 (ref 2.0–3.0)

## 2018-05-07 DIAGNOSIS — N186 End stage renal disease: Secondary | ICD-10-CM | POA: Diagnosis not present

## 2018-05-07 DIAGNOSIS — D509 Iron deficiency anemia, unspecified: Secondary | ICD-10-CM | POA: Diagnosis not present

## 2018-05-07 DIAGNOSIS — E1122 Type 2 diabetes mellitus with diabetic chronic kidney disease: Secondary | ICD-10-CM | POA: Diagnosis not present

## 2018-05-07 DIAGNOSIS — N2581 Secondary hyperparathyroidism of renal origin: Secondary | ICD-10-CM | POA: Diagnosis not present

## 2018-05-09 DIAGNOSIS — E46 Unspecified protein-calorie malnutrition: Secondary | ICD-10-CM | POA: Insufficient documentation

## 2018-05-09 DIAGNOSIS — E441 Mild protein-calorie malnutrition: Secondary | ICD-10-CM | POA: Insufficient documentation

## 2018-05-10 ENCOUNTER — Ambulatory Visit (INDEPENDENT_AMBULATORY_CARE_PROVIDER_SITE_OTHER): Payer: Medicare Other | Admitting: General Practice

## 2018-05-10 DIAGNOSIS — N2581 Secondary hyperparathyroidism of renal origin: Secondary | ICD-10-CM | POA: Diagnosis not present

## 2018-05-10 DIAGNOSIS — Z7901 Long term (current) use of anticoagulants: Secondary | ICD-10-CM

## 2018-05-10 DIAGNOSIS — D509 Iron deficiency anemia, unspecified: Secondary | ICD-10-CM | POA: Diagnosis not present

## 2018-05-10 DIAGNOSIS — E1122 Type 2 diabetes mellitus with diabetic chronic kidney disease: Secondary | ICD-10-CM | POA: Diagnosis not present

## 2018-05-10 DIAGNOSIS — N186 End stage renal disease: Secondary | ICD-10-CM | POA: Diagnosis not present

## 2018-05-10 NOTE — Progress Notes (Signed)
Medical treatment/procedure(s) were performed by non-physician practitioner and as supervising physician I was immediately available for consultation/collaboration. I agree with above. Abdikadir Fohl A Irelyn Perfecto, MD  

## 2018-05-10 NOTE — Patient Instructions (Addendum)
Pre visit review using our clinic review tool, if applicable. No additional management support is needed unless otherwise documented below in the visit note.  Continue to take 1 tablet daily except 1/2 tablet on Wednesdays.   Re-check in 1 week. Instructions given to patient.  Dialysis patient.

## 2018-05-11 ENCOUNTER — Other Ambulatory Visit: Payer: Self-pay

## 2018-05-11 DIAGNOSIS — Z992 Dependence on renal dialysis: Secondary | ICD-10-CM | POA: Diagnosis not present

## 2018-05-11 DIAGNOSIS — N186 End stage renal disease: Secondary | ICD-10-CM | POA: Diagnosis not present

## 2018-05-11 DIAGNOSIS — E1122 Type 2 diabetes mellitus with diabetic chronic kidney disease: Secondary | ICD-10-CM | POA: Diagnosis not present

## 2018-05-11 MED ORDER — GLUCOSE BLOOD VI STRP: ORAL_STRIP | 12 refills | Status: DC

## 2018-05-12 DIAGNOSIS — N186 End stage renal disease: Secondary | ICD-10-CM | POA: Diagnosis not present

## 2018-05-12 DIAGNOSIS — E8770 Fluid overload, unspecified: Secondary | ICD-10-CM | POA: Diagnosis not present

## 2018-05-12 DIAGNOSIS — E1122 Type 2 diabetes mellitus with diabetic chronic kidney disease: Secondary | ICD-10-CM | POA: Diagnosis not present

## 2018-05-12 DIAGNOSIS — Z992 Dependence on renal dialysis: Secondary | ICD-10-CM | POA: Diagnosis not present

## 2018-05-12 DIAGNOSIS — N2581 Secondary hyperparathyroidism of renal origin: Secondary | ICD-10-CM | POA: Diagnosis not present

## 2018-05-12 DIAGNOSIS — I82492 Acute embolism and thrombosis of other specified deep vein of left lower extremity: Secondary | ICD-10-CM | POA: Diagnosis not present

## 2018-05-13 LAB — PROTIME-INR: INR: 3 — AB (ref <=1.1)

## 2018-05-14 DIAGNOSIS — E8770 Fluid overload, unspecified: Secondary | ICD-10-CM | POA: Diagnosis not present

## 2018-05-14 DIAGNOSIS — N186 End stage renal disease: Secondary | ICD-10-CM | POA: Diagnosis not present

## 2018-05-14 DIAGNOSIS — N2581 Secondary hyperparathyroidism of renal origin: Secondary | ICD-10-CM | POA: Diagnosis not present

## 2018-05-14 DIAGNOSIS — E1122 Type 2 diabetes mellitus with diabetic chronic kidney disease: Secondary | ICD-10-CM | POA: Diagnosis not present

## 2018-05-17 ENCOUNTER — Ambulatory Visit (INDEPENDENT_AMBULATORY_CARE_PROVIDER_SITE_OTHER): Payer: Medicare Other | Admitting: General Practice

## 2018-05-17 DIAGNOSIS — N186 End stage renal disease: Secondary | ICD-10-CM | POA: Diagnosis not present

## 2018-05-17 DIAGNOSIS — E8770 Fluid overload, unspecified: Secondary | ICD-10-CM | POA: Diagnosis not present

## 2018-05-17 DIAGNOSIS — E1122 Type 2 diabetes mellitus with diabetic chronic kidney disease: Secondary | ICD-10-CM | POA: Diagnosis not present

## 2018-05-17 DIAGNOSIS — Z7901 Long term (current) use of anticoagulants: Secondary | ICD-10-CM

## 2018-05-17 DIAGNOSIS — N2581 Secondary hyperparathyroidism of renal origin: Secondary | ICD-10-CM | POA: Diagnosis not present

## 2018-05-17 NOTE — Patient Instructions (Signed)
Pre visit review using our clinic review tool, if applicable. No additional management support is needed unless otherwise documented below in the visit note.  Continue to take 1 tablet daily except 1/2 tablet on Wednesdays.   Re-check in 1 week. Instructions given to patient.  Dialysis patient.

## 2018-05-17 NOTE — Progress Notes (Signed)
Medical treatment/procedure(s) were performed by non-physician practitioner and as supervising physician I was immediately available for consultation/collaboration. I agree with above. Elizabeth A Crawford, MD  

## 2018-05-18 ENCOUNTER — Other Ambulatory Visit: Payer: Self-pay

## 2018-05-18 ENCOUNTER — Telehealth: Payer: Self-pay | Admitting: Endocrinology

## 2018-05-18 MED ORDER — INSULIN REGULAR HUMAN (CONC) 500 UNIT/ML ~~LOC~~ SOPN
PEN_INJECTOR | SUBCUTANEOUS | 1 refills | Status: DC
Start: 2018-05-18 — End: 2018-11-30

## 2018-05-18 MED ORDER — INSULIN GLARGINE 300 UNIT/ML ~~LOC~~ SOPN: 70.0000 [IU] | PEN_INJECTOR | Freq: Two times a day (BID) | SUBCUTANEOUS | 1 refills | Status: DC

## 2018-05-18 MED ORDER — INSULIN REGULAR HUMAN (CONC) 500 UNIT/ML ~~LOC~~ SOPN: PEN_INJECTOR | SUBCUTANEOUS | 1 refills | Status: DC

## 2018-05-18 NOTE — Telephone Encounter (Signed)
I have sent patient's insulin for Toujeo & Humulin R to [facility]. I also have submitted PA to [facility] for Accu-Chek Guide strips. We are waiting on response. I called pharmacy to get info BIN# [id number] PCN# [id number] GRP# [id number] ID# [id number] A. When I entered  This info under cover my meds however it asked that I check insurance state. I called patient & he stated that he had medicare as well as [employer] through [employer]. I chose PA form based on this info.

## 2018-05-18 NOTE — Telephone Encounter (Signed)
Humulin and Toujeo need to be called into [facility]-pt now has an appt set up for end of sept can we call in enough to bridge him  Test strips needs a PA from his insurance for [facility] to be able to fill them

## 2018-05-19 ENCOUNTER — Telehealth: Payer: Self-pay

## 2018-05-19 DIAGNOSIS — E1122 Type 2 diabetes mellitus with diabetic chronic kidney disease: Secondary | ICD-10-CM | POA: Diagnosis not present

## 2018-05-19 DIAGNOSIS — N2581 Secondary hyperparathyroidism of renal origin: Secondary | ICD-10-CM | POA: Diagnosis not present

## 2018-05-19 DIAGNOSIS — E8770 Fluid overload, unspecified: Secondary | ICD-10-CM | POA: Diagnosis not present

## 2018-05-19 DIAGNOSIS — I82492 Acute embolism and thrombosis of other specified deep vein of left lower extremity: Secondary | ICD-10-CM | POA: Diagnosis not present

## 2018-05-19 DIAGNOSIS — M109 Gout, unspecified: Secondary | ICD-10-CM | POA: Diagnosis not present

## 2018-05-19 DIAGNOSIS — N186 End stage renal disease: Secondary | ICD-10-CM | POA: Diagnosis not present

## 2018-05-19 NOTE — Telephone Encounter (Signed)
Received fax from [facility] sttting that pt was denied coverage for accu chek  Guide test strips under medicare part d benefits.

## 2018-05-21 DIAGNOSIS — E8770 Fluid overload, unspecified: Secondary | ICD-10-CM | POA: Diagnosis not present

## 2018-05-21 DIAGNOSIS — N186 End stage renal disease: Secondary | ICD-10-CM | POA: Diagnosis not present

## 2018-05-21 DIAGNOSIS — E1122 Type 2 diabetes mellitus with diabetic chronic kidney disease: Secondary | ICD-10-CM | POA: Diagnosis not present

## 2018-05-21 DIAGNOSIS — N2581 Secondary hyperparathyroidism of renal origin: Secondary | ICD-10-CM | POA: Diagnosis not present

## 2018-05-23 ENCOUNTER — Other Ambulatory Visit: Payer: Self-pay | Admitting: Internal Medicine

## 2018-05-23 ENCOUNTER — Other Ambulatory Visit: Payer: Self-pay | Admitting: Family

## 2018-05-23 ENCOUNTER — Other Ambulatory Visit: Payer: Self-pay | Admitting: Endocrinology

## 2018-05-24 ENCOUNTER — Ambulatory Visit (INDEPENDENT_AMBULATORY_CARE_PROVIDER_SITE_OTHER): Payer: Medicare Other | Admitting: General Practice

## 2018-05-24 DIAGNOSIS — Z7901 Long term (current) use of anticoagulants: Secondary | ICD-10-CM

## 2018-05-24 DIAGNOSIS — E8770 Fluid overload, unspecified: Secondary | ICD-10-CM | POA: Diagnosis not present

## 2018-05-24 DIAGNOSIS — N2581 Secondary hyperparathyroidism of renal origin: Secondary | ICD-10-CM | POA: Diagnosis not present

## 2018-05-24 DIAGNOSIS — N186 End stage renal disease: Secondary | ICD-10-CM | POA: Diagnosis not present

## 2018-05-24 DIAGNOSIS — E1122 Type 2 diabetes mellitus with diabetic chronic kidney disease: Secondary | ICD-10-CM | POA: Diagnosis not present

## 2018-05-24 LAB — PROTIME-INR: INR: 5.1 — AB (ref <=1.1)

## 2018-05-24 NOTE — Patient Instructions (Signed)
Pre visit review using our clinic review tool, if applicable. No additional management support is needed unless otherwise documented below in the visit note.  Hold coumadin today and tomorrow (8/13 and 8/14) and take 1/2 tablet on Thursday.  On Friday change dosage and take 1 tablet daily except 1/2 tablet on Wednesdays and Saturdays. Re-check in 1 week. Instructions given to patient.  Dialysis patient.

## 2018-05-24 NOTE — Progress Notes (Signed)
Medical treatment/procedure(s) were performed by non-physician practitioner and as supervising physician I was immediately available for consultation/collaboration. I agree with above. Elizabeth A Crawford, MD  

## 2018-05-26 DIAGNOSIS — I82492 Acute embolism and thrombosis of other specified deep vein of left lower extremity: Secondary | ICD-10-CM | POA: Diagnosis not present

## 2018-05-26 DIAGNOSIS — N2581 Secondary hyperparathyroidism of renal origin: Secondary | ICD-10-CM | POA: Diagnosis not present

## 2018-05-26 DIAGNOSIS — N186 End stage renal disease: Secondary | ICD-10-CM | POA: Diagnosis not present

## 2018-05-26 DIAGNOSIS — E8770 Fluid overload, unspecified: Secondary | ICD-10-CM | POA: Diagnosis not present

## 2018-05-26 DIAGNOSIS — E1122 Type 2 diabetes mellitus with diabetic chronic kidney disease: Secondary | ICD-10-CM | POA: Diagnosis not present

## 2018-05-27 LAB — PROTIME-INR: INR: 2.2 — AB (ref <=1.1)

## 2018-05-28 DIAGNOSIS — E1122 Type 2 diabetes mellitus with diabetic chronic kidney disease: Secondary | ICD-10-CM | POA: Diagnosis not present

## 2018-05-28 DIAGNOSIS — N2581 Secondary hyperparathyroidism of renal origin: Secondary | ICD-10-CM | POA: Diagnosis not present

## 2018-05-28 DIAGNOSIS — E8770 Fluid overload, unspecified: Secondary | ICD-10-CM | POA: Diagnosis not present

## 2018-05-28 DIAGNOSIS — N186 End stage renal disease: Secondary | ICD-10-CM | POA: Diagnosis not present

## 2018-05-31 ENCOUNTER — Ambulatory Visit (INDEPENDENT_AMBULATORY_CARE_PROVIDER_SITE_OTHER): Payer: Medicare Other | Admitting: General Practice

## 2018-05-31 DIAGNOSIS — Z7901 Long term (current) use of anticoagulants: Secondary | ICD-10-CM | POA: Diagnosis not present

## 2018-05-31 DIAGNOSIS — N2581 Secondary hyperparathyroidism of renal origin: Secondary | ICD-10-CM | POA: Diagnosis not present

## 2018-05-31 DIAGNOSIS — N186 End stage renal disease: Secondary | ICD-10-CM | POA: Diagnosis not present

## 2018-05-31 DIAGNOSIS — E1122 Type 2 diabetes mellitus with diabetic chronic kidney disease: Secondary | ICD-10-CM | POA: Diagnosis not present

## 2018-05-31 DIAGNOSIS — E8770 Fluid overload, unspecified: Secondary | ICD-10-CM | POA: Diagnosis not present

## 2018-05-31 NOTE — Progress Notes (Signed)
Medical treatment/procedure(s) were performed by non-physician practitioner and as supervising physician I was immediately available for consultation/collaboration. I agree with above. Elizabeth A Crawford, MD  

## 2018-05-31 NOTE — Patient Instructions (Signed)
Pre visit review using our clinic review tool, if applicable. No additional management support is needed unless otherwise documented below in the visit note.  Continue to take 1 tablet daily except 1/2 tablet on Wednesdays and Saturdays. Re-check in 1 week. Instructions given to patient.  Dialysis patient.

## 2018-06-02 DIAGNOSIS — E1122 Type 2 diabetes mellitus with diabetic chronic kidney disease: Secondary | ICD-10-CM | POA: Diagnosis not present

## 2018-06-02 DIAGNOSIS — N186 End stage renal disease: Secondary | ICD-10-CM | POA: Diagnosis not present

## 2018-06-02 DIAGNOSIS — I82492 Acute embolism and thrombosis of other specified deep vein of left lower extremity: Secondary | ICD-10-CM | POA: Diagnosis not present

## 2018-06-02 DIAGNOSIS — N2581 Secondary hyperparathyroidism of renal origin: Secondary | ICD-10-CM | POA: Diagnosis not present

## 2018-06-02 DIAGNOSIS — E8770 Fluid overload, unspecified: Secondary | ICD-10-CM | POA: Diagnosis not present

## 2018-06-04 DIAGNOSIS — N186 End stage renal disease: Secondary | ICD-10-CM | POA: Diagnosis not present

## 2018-06-04 DIAGNOSIS — E1122 Type 2 diabetes mellitus with diabetic chronic kidney disease: Secondary | ICD-10-CM | POA: Diagnosis not present

## 2018-06-04 DIAGNOSIS — E8770 Fluid overload, unspecified: Secondary | ICD-10-CM | POA: Diagnosis not present

## 2018-06-04 DIAGNOSIS — N2581 Secondary hyperparathyroidism of renal origin: Secondary | ICD-10-CM | POA: Diagnosis not present

## 2018-06-06 DIAGNOSIS — E1122 Type 2 diabetes mellitus with diabetic chronic kidney disease: Secondary | ICD-10-CM | POA: Diagnosis not present

## 2018-06-06 DIAGNOSIS — N186 End stage renal disease: Secondary | ICD-10-CM | POA: Diagnosis not present

## 2018-06-06 DIAGNOSIS — E8770 Fluid overload, unspecified: Secondary | ICD-10-CM | POA: Diagnosis not present

## 2018-06-06 DIAGNOSIS — N2581 Secondary hyperparathyroidism of renal origin: Secondary | ICD-10-CM | POA: Diagnosis not present

## 2018-06-09 DIAGNOSIS — N2581 Secondary hyperparathyroidism of renal origin: Secondary | ICD-10-CM | POA: Diagnosis not present

## 2018-06-09 DIAGNOSIS — I82492 Acute embolism and thrombosis of other specified deep vein of left lower extremity: Secondary | ICD-10-CM | POA: Diagnosis not present

## 2018-06-09 DIAGNOSIS — E1122 Type 2 diabetes mellitus with diabetic chronic kidney disease: Secondary | ICD-10-CM | POA: Diagnosis not present

## 2018-06-09 DIAGNOSIS — E8770 Fluid overload, unspecified: Secondary | ICD-10-CM | POA: Diagnosis not present

## 2018-06-09 DIAGNOSIS — N186 End stage renal disease: Secondary | ICD-10-CM | POA: Diagnosis not present

## 2018-06-09 LAB — PROTIME-INR: INR: 3.6 — AB (ref 0.9–1.1)

## 2018-06-11 DIAGNOSIS — N186 End stage renal disease: Secondary | ICD-10-CM | POA: Diagnosis not present

## 2018-06-11 DIAGNOSIS — E8770 Fluid overload, unspecified: Secondary | ICD-10-CM | POA: Diagnosis not present

## 2018-06-11 DIAGNOSIS — E1122 Type 2 diabetes mellitus with diabetic chronic kidney disease: Secondary | ICD-10-CM | POA: Diagnosis not present

## 2018-06-11 DIAGNOSIS — N2581 Secondary hyperparathyroidism of renal origin: Secondary | ICD-10-CM | POA: Diagnosis not present

## 2018-06-12 DIAGNOSIS — Z992 Dependence on renal dialysis: Secondary | ICD-10-CM | POA: Diagnosis not present

## 2018-06-12 DIAGNOSIS — N186 End stage renal disease: Secondary | ICD-10-CM | POA: Diagnosis not present

## 2018-06-12 DIAGNOSIS — E1122 Type 2 diabetes mellitus with diabetic chronic kidney disease: Secondary | ICD-10-CM | POA: Diagnosis not present

## 2018-06-14 DIAGNOSIS — E1122 Type 2 diabetes mellitus with diabetic chronic kidney disease: Secondary | ICD-10-CM | POA: Diagnosis not present

## 2018-06-14 DIAGNOSIS — N2581 Secondary hyperparathyroidism of renal origin: Secondary | ICD-10-CM | POA: Diagnosis not present

## 2018-06-14 DIAGNOSIS — N186 End stage renal disease: Secondary | ICD-10-CM | POA: Diagnosis not present

## 2018-06-15 ENCOUNTER — Encounter: Payer: Self-pay | Admitting: Internal Medicine

## 2018-06-15 ENCOUNTER — Ambulatory Visit (INDEPENDENT_AMBULATORY_CARE_PROVIDER_SITE_OTHER): Payer: Medicare Other | Admitting: Internal Medicine

## 2018-06-15 VITALS — BP 120/60 | HR 99 | Temp 98.4°F | Ht 70.0 in

## 2018-06-15 DIAGNOSIS — L989 Disorder of the skin and subcutaneous tissue, unspecified: Secondary | ICD-10-CM

## 2018-06-15 DIAGNOSIS — M1A39X0 Chronic gout due to renal impairment, multiple sites, without tophus (tophi): Secondary | ICD-10-CM | POA: Diagnosis not present

## 2018-06-15 DIAGNOSIS — I10 Essential (primary) hypertension: Secondary | ICD-10-CM

## 2018-06-15 MED ORDER — TRIAMCINOLONE ACETONIDE 0.1 % EX CREA: 1.0000 "application " | TOPICAL_CREAM | Freq: Two times a day (BID) | CUTANEOUS | 2 refills | Status: DC

## 2018-06-15 MED ORDER — ALLOPURINOL 100 MG PO TABS: 100.0000 mg | ORAL_TABLET | ORAL | 6 refills | Status: DC

## 2018-06-15 NOTE — Progress Notes (Signed)
   Subjective:    Patient ID: [name] [name], male    DOB: August 08, 1966, 52 y.o.   MRN: [id number]  HPI The patient is a 52 YO man coming in for several concerns including rash (on legs, appear to be lesions on both legs, started several months ago, worsening in the last month, denies itching or hurting in the area, has not tried anything for it), and gout (taking febuxostat, some black box warning for past CV disease which he has, on coumadin, ESRD on dialysis, no flare recently and his nephrologist has prescribed this recently), and blood pressure (admits this has been doing well lately, not going low or high at dialysis, has been taken off all medications for blood pressure).   Review of Systems  Constitutional: Negative.   HENT: Negative.   Eyes: Negative.   Respiratory: Negative for cough, chest tightness and shortness of breath.   Cardiovascular: Negative for chest pain, palpitations and leg swelling.  Gastrointestinal: Negative for abdominal distention, abdominal pain, constipation, diarrhea, nausea and vomiting.  Musculoskeletal: Negative.   Skin: Positive for color change and rash.  Neurological: Negative.   Psychiatric/Behavioral: Negative.       Objective:   Physical Exam  Constitutional: He is oriented to person, place, and time. He appears well-developed and well-nourished.  HENT:  Head: Normocephalic and atraumatic.  Eyes: EOM are normal.  Neck: Normal range of motion.  Cardiovascular: Normal rate and regular rhythm.  Pulmonary/Chest: Effort normal and breath sounds normal. No respiratory distress. He has no wheezes. He has no rales.  Abdominal: Soft. Bowel sounds are normal. He exhibits no distension. There is no tenderness. There is no rebound.  Musculoskeletal: He exhibits no edema.  Neurological: He is alert and oriented to person, place, and time. Coordination abnormal.  Uses scooter for long distances, can walk short distance and transfer independently.   Skin: Skin  is warm and dry.  Purple macular lesions on the legs bilaterally more prominent on the lateral aspect. Non tender and not itching. No surrounding cellulitis. No swelling.   Psychiatric: He has a normal mood and affect.   Vitals:   06/15/18 0836  BP: 120/60  Pulse: 99  Temp: 98.4 F (36.9 C)  TempSrc: Oral  SpO2: 94%  Height: 5\' 10"  (1.778 m)      Assessment & Plan:

## 2018-06-15 NOTE — Patient Instructions (Addendum)
Once you get the new pharmacy let us know and we will send in your medicines.   We are going to stop the uloric (feboxustat) and change it with allopurinol to take 1 pill after dialysis only on dialysis days and then we or the kidney doctor will need to check blood work about 1 month after changing this.   We will send in a cream to use on the legs and will work on getting you in to a dermatologist to see if they can biopsy one of those spots.

## 2018-06-16 DIAGNOSIS — N186 End stage renal disease: Secondary | ICD-10-CM | POA: Diagnosis not present

## 2018-06-16 DIAGNOSIS — E1122 Type 2 diabetes mellitus with diabetic chronic kidney disease: Secondary | ICD-10-CM | POA: Diagnosis not present

## 2018-06-16 DIAGNOSIS — I82492 Acute embolism and thrombosis of other specified deep vein of left lower extremity: Secondary | ICD-10-CM | POA: Diagnosis not present

## 2018-06-16 DIAGNOSIS — N2581 Secondary hyperparathyroidism of renal origin: Secondary | ICD-10-CM | POA: Diagnosis not present

## 2018-06-16 DIAGNOSIS — L989 Disorder of the skin and subcutaneous tissue, unspecified: Secondary | ICD-10-CM | POA: Insufficient documentation

## 2018-06-16 DIAGNOSIS — M109 Gout, unspecified: Secondary | ICD-10-CM | POA: Diagnosis not present

## 2018-06-16 NOTE — Assessment & Plan Note (Signed)
Suspect that this could be some sequelae of his vascular disease. Referral to dermatology for biopsy. Rx for triamcinolone to try.

## 2018-06-16 NOTE — Assessment & Plan Note (Signed)
BP at goal off meds currently and will continue with close monitoring.

## 2018-06-16 NOTE — Assessment & Plan Note (Signed)
Per pharmacy recommendations will stop febuxostat and start allopurinol 100 mg post dialysis for 1 month then recheck uric acid levels.

## 2018-06-18 DIAGNOSIS — E1122 Type 2 diabetes mellitus with diabetic chronic kidney disease: Secondary | ICD-10-CM | POA: Diagnosis not present

## 2018-06-18 DIAGNOSIS — N186 End stage renal disease: Secondary | ICD-10-CM | POA: Diagnosis not present

## 2018-06-18 DIAGNOSIS — N2581 Secondary hyperparathyroidism of renal origin: Secondary | ICD-10-CM | POA: Diagnosis not present

## 2018-06-21 DIAGNOSIS — E1122 Type 2 diabetes mellitus with diabetic chronic kidney disease: Secondary | ICD-10-CM | POA: Diagnosis not present

## 2018-06-21 DIAGNOSIS — N2581 Secondary hyperparathyroidism of renal origin: Secondary | ICD-10-CM | POA: Diagnosis not present

## 2018-06-21 DIAGNOSIS — N186 End stage renal disease: Secondary | ICD-10-CM | POA: Diagnosis not present

## 2018-06-23 DIAGNOSIS — I82492 Acute embolism and thrombosis of other specified deep vein of left lower extremity: Secondary | ICD-10-CM | POA: Diagnosis not present

## 2018-06-23 DIAGNOSIS — N2581 Secondary hyperparathyroidism of renal origin: Secondary | ICD-10-CM | POA: Diagnosis not present

## 2018-06-23 DIAGNOSIS — N186 End stage renal disease: Secondary | ICD-10-CM | POA: Diagnosis not present

## 2018-06-23 DIAGNOSIS — E1122 Type 2 diabetes mellitus with diabetic chronic kidney disease: Secondary | ICD-10-CM | POA: Diagnosis not present

## 2018-06-25 DIAGNOSIS — N2581 Secondary hyperparathyroidism of renal origin: Secondary | ICD-10-CM | POA: Diagnosis not present

## 2018-06-25 DIAGNOSIS — N186 End stage renal disease: Secondary | ICD-10-CM | POA: Diagnosis not present

## 2018-06-25 DIAGNOSIS — E1122 Type 2 diabetes mellitus with diabetic chronic kidney disease: Secondary | ICD-10-CM | POA: Diagnosis not present

## 2018-06-27 ENCOUNTER — Other Ambulatory Visit: Payer: Self-pay

## 2018-06-27 MED ORDER — GLUCOSE BLOOD VI STRP: ORAL_STRIP | 5 refills | Status: DC

## 2018-06-28 DIAGNOSIS — N186 End stage renal disease: Secondary | ICD-10-CM | POA: Diagnosis not present

## 2018-06-28 DIAGNOSIS — N2581 Secondary hyperparathyroidism of renal origin: Secondary | ICD-10-CM | POA: Diagnosis not present

## 2018-06-28 DIAGNOSIS — E1122 Type 2 diabetes mellitus with diabetic chronic kidney disease: Secondary | ICD-10-CM | POA: Diagnosis not present

## 2018-06-30 ENCOUNTER — Encounter: Payer: Self-pay | Admitting: Internal Medicine

## 2018-06-30 DIAGNOSIS — N2581 Secondary hyperparathyroidism of renal origin: Secondary | ICD-10-CM | POA: Diagnosis not present

## 2018-06-30 DIAGNOSIS — E1122 Type 2 diabetes mellitus with diabetic chronic kidney disease: Secondary | ICD-10-CM | POA: Diagnosis not present

## 2018-06-30 DIAGNOSIS — I82492 Acute embolism and thrombosis of other specified deep vein of left lower extremity: Secondary | ICD-10-CM | POA: Diagnosis not present

## 2018-06-30 DIAGNOSIS — N186 End stage renal disease: Secondary | ICD-10-CM | POA: Diagnosis not present

## 2018-07-01 LAB — POCT INR: INR: 1.9 — AB (ref 2.0–3.0)

## 2018-07-02 DIAGNOSIS — N2581 Secondary hyperparathyroidism of renal origin: Secondary | ICD-10-CM | POA: Diagnosis not present

## 2018-07-02 DIAGNOSIS — N186 End stage renal disease: Secondary | ICD-10-CM | POA: Diagnosis not present

## 2018-07-02 DIAGNOSIS — E1122 Type 2 diabetes mellitus with diabetic chronic kidney disease: Secondary | ICD-10-CM | POA: Diagnosis not present

## 2018-07-04 ENCOUNTER — Encounter

## 2018-07-04 ENCOUNTER — Ambulatory Visit (INDEPENDENT_AMBULATORY_CARE_PROVIDER_SITE_OTHER): Payer: Medicare Other | Admitting: Endocrinology

## 2018-07-04 ENCOUNTER — Encounter: Payer: Self-pay | Admitting: Endocrinology

## 2018-07-04 VITALS — BP 128/86 | HR 64 | Ht 70.0 in | Wt 319.0 lb

## 2018-07-04 DIAGNOSIS — Z794 Long term (current) use of insulin: Secondary | ICD-10-CM

## 2018-07-04 DIAGNOSIS — E1165 Type 2 diabetes mellitus with hyperglycemia: Secondary | ICD-10-CM | POA: Diagnosis not present

## 2018-07-04 NOTE — Progress Notes (Addendum)
Patient ID: [name] [name], male   DOB: 03/10/1966, 52 y.o.   MRN: [id number]           Reason for Appointment: Follow-up for Type 2 Diabetes  History of Present Illness:          Date of diagnosis of type 2 diabetes mellitus : 1990       Background history:  He has been taking insulin since about 1999, previously was on unknown oral agents He had good control with insulin initially but A1c was much higher in 2008 and subsequently has had required larger doses of insulin He has been on Lantus for the last few years along with Novolog,followed by an endocrinologist since 2008 His A1c was 6.3 in 04/2014   Prior to his consultation he was on a regimen of Novolog , 50 units 5 times a day with each meal and snack along with 90 Units 3 times a day of Lantus   Recent history:   INSULIN regimen is :TOUJEO 80 units twice daily  U-500 insulin with KwikPen 30 before bfst, 35 before lunch and 35 before dinner, usually 30 min before  Sliding scale use for high sugars: Sugar 150-199 take 2 units, 200-250= 4 units and over 250 take 6 units   A1c is slightly better at 7.4 compared to 7.9   He has not been seen in follow-up since 2/19  Current blood sugar patterns and problems identified:   He does not have his meter downloading today and not clear how often he is checking his blood sugars, generally checking blood sugars less often because of having difficulty getting prescription filled for his test strips due to insurance and also not regularly following up  He thinks his blood sugars are mostly high at lunch and suppertime  Also tending to take less insulin in the morning because of low normal blood sugars but is usually taking about the same dose at lunch and supper empirically, somewhat more at lunch compared to the last visit  Usually not symptomatic with hypoglycemia Also not checking readings after evening meals Again tends to have low normal FASTING readings and not adjusting his  Toujeo as directed on the last visit in the evening by reducing the dose   Compliance with the medical regimen: Inconsistent Hypoglycemia: As above  Glucose monitoring:  done 0-2 times a day         Glucometer: Accu-Chek       Blood Glucose readings by recall:   PRE-MEAL Fasting Lunch Dinner Bedtime Overall  Glucose range: 67-101 180  180 ?   Mean/median:        POST-MEAL PC Breakfast PC Lunch PC Dinner  Glucose range:   ?  Mean/median:         Self-care: The diet that the patient has been following is: tries to limit high-fat meals, usually eating  2-3 meals a day.     Typical meal intake: Breakfast is grits with pecans, olive oil. Dinner 5-6 pm                Dietician visit, most recent:2013               Exercise:  unable to do any, In wheelchair  Weight history:  Wt Readings from Last 3 Encounters:  07/04/18 (!) 319 lb (144.7 kg)  04/12/18 (!) 304 lb 3.8 oz (138 kg)  02/28/18 (!) 309 lb (140.2 kg)    Glycemic control:   Lab Results  Component Value Date  HGBA1C 7.4 (A) 07/05/2018   HGBA1C 7.9 (H) 10/20/2017   HGBA1C 7.2 (H) 05/19/2017   Lab Results  Component Value Date   MICROALBUR 232.9 (H) 06/19/2015   LDLCALC 36 07/23/2017   CREATININE 10.47 (H) 04/12/2018    Lab Results  Component Value Date   FRUCTOSAMINE 408 (H) 07/23/2017   FRUCTOSAMINE 408 (H) 05/19/2017   FRUCTOSAMINE 361 (H) 10/26/2016      Allergies as of 07/04/2018      Reactions   Pork-derived Products Anaphylaxis, Other (See Comments)   Fever also    Ace Inhibitors Cough   Hydrocodone Other (See Comments)   Hallucinations      Medication List        Accurate as of 07/04/18 11:59 PM. Always use your most recent med list.          allopurinol 100 MG tablet Commonly known as:  ZYLOPRIM Take 1 tablet (100 mg total) by mouth 3 (three) times a week. After dialysis   calcium acetate 667 MG capsule Commonly known as:  PHOSLO Take 3 capsules (2,001 mg total) by mouth 3  (three) times daily with meals.   clonazePAM 1 MG tablet Commonly known as:  KLONOPIN   glucose blood test strip Use to test 4 times daily DX code E11.29   hydrocortisone 25 MG suppository Commonly known as:  ANUSOL-HC Place 1 suppository (25 mg total) rectally 2 (two) times daily.   hydrocortisone cream 1 % Apply to affected area 2 times daily   hydrocortisone-pramoxine rectal foam Commonly known as:  PROCTOFOAM-HC Place 1 applicator rectally 3 (three) times daily. Prn hemorrhoids   Insulin Glargine 300 UNIT/ML Sopn Inject 70-80 Units into the skin 2 (two) times daily. Inject 80 units in the morning and inject 70 units in the evening.   Insulin Pen Needle 32G X 8 MM Misc Use as directed   Insulin Pen Needle 32G X 4 MM Misc USE  TO INJECT FIVE TIMES DAILY AS DIRECTED   insulin regular human CONCENTRATED 500 UNIT/ML kwikpen Commonly known as:  HUMULIN R INJECT SUBCUTANEOUSLY 15 UNITS AT BREAKFAST, 25 UNITS AT LUNCH AND 25-30 UNITS BEFORE SUPPER   midodrine 10 MG tablet Commonly known as:  PROAMATINE Take 10 mg by mouth as needed (low blood pressure).   omeprazole 20 MG capsule Commonly known as:  PRILOSEC TAKE 1 CAPSULE EVERY DAY   ONE TOUCH LANCETS Misc Use to test sugar 4 times daily   RENVELA 800 MG tablet Generic drug:  sevelamer carbonate Take 2,400 mg by mouth 3 (three) times daily with meals.   rosuvastatin 20 MG tablet Commonly known as:  CRESTOR TAKE 1 TABLET AT BEDTIME   traMADol 50 MG tablet Commonly known as:  ULTRAM Take 1 tablet (50 mg total) by mouth every 8 (eight) hours as needed.   triamcinolone cream 0.1 % Commonly known as:  KENALOG Apply 1 application topically 2 (two) times daily.   VELTASSA 8.4 g packet Generic drug:  patiromer Take 8.4 g by mouth daily.   warfarin 5 MG tablet Commonly known as:  COUMADIN Take as directed by the anticoagulation clinic. If you are unsure how to take this medication, talk to your nurse or  doctor. Original instructions:  Take 1 (5 mg) tablet every day except for Wednesday & Saturday when you take 7.96m (1.5 tablets) OR As directed.       Allergies:  Allergies  Allergen Reactions  . Pork-Derived Products Anaphylaxis and Other (See Comments)  Fever also   . Ace Inhibitors Cough  . Hydrocodone Other (See Comments)    Hallucinations    Past Medical History:  Diagnosis Date  . Allergic rhinitis   . Anemia   . Aortic insufficiency    a. Echo 7/16:  Mod LVH, EF 50-55%, mild to mod AI, severe LAE, PASP 57 mmHg (LV ID end diastolic 25.3 mm);  b. Echo 2/17:  Mod LVH, EF 50-55%, mod AI, MAC, mild MR, mod LAE, mild to mod TR, PASP 63 mmHg  . Bilateral carpal tunnel syndrome   . CHF (congestive heart failure) ([place])   . Claustrophobia   . COPD (chronic obstructive pulmonary disease) ([place])   . Diabetes mellitus    Type II  . Diabetic peripheral neuropathy ([place])   . Diabetic retinopathy ([place])   . Discitis of lumbar region    resolved  . DVT (deep venous thrombosis) ([place]) 10/07/2015   LLE  . Endocarditis 11/28/14  . ESRD (end stage renal disease) on dialysis [facility])    s/p renal transplant in 2009, failed in 2016  . Gait disorder   . Gastroparesis   . GERD (gastroesophageal reflux disease)   . Gout   . Hearing difficulty   . Hyperlipidemia   . Hypertension   . Incomplete bladder emptying   . Leg pain 02/05/11   with walking  . Morbid obesity ([place])   . Obstructive sleep apnea    not using CPAP- lost 100lbs  . Osteomyelitis ([place])   . Posttraumatic stress disorder   . Rotator cuff disorder   . Secondary hyperparathyroidism ([place])   . Sepsis ([place])    Postive blood cultures -   . Urethral stricture   . Vitamin D deficiency     Past Surgical History:  Procedure Laterality Date  . ARTERIOVENOUS GRAFT PLACEMENT Bilateral "several"  . AV FISTULA PLACEMENT Bilateral 2015  . BASCILIC VEIN TRANSPOSITION Right 09/27/2015  . [place] Right 09/27/2015    Procedure: BASILIC VEIN TRANSPOSITION right;  Surgeon: Mal Misty, MD;  Location: [place];  Service: Vascular;  Laterality: Right;  . CATARACT EXTRACTION W/ INTRAOCULAR LENS  IMPLANT, BILATERAL Bilateral   . CYSTOSCOPY W/ INTERNAL URETHROTOMY  09/2014  . KIDNEY TRANSPLANT  2009    Family History  Problem Relation Age of Onset  . Hypertension Mother   . Diabetes Father     Social History:  reports that he has never smoked. He has never used smokeless tobacco. He reports that he does not drink alcohol or use drugs.    Review of Systems    Review of Systems  Gastrointestinal: Positive for diarrhea.    OBESITY: He says he wants to get the bariatric surgery done  Lipid history: He is on 20 mg Crestor With the following results    Lab Results  Component Value Date   CHOL 93 07/23/2017   HDL 26.10 (L) 07/23/2017   LDLCALC 36 07/23/2017   TRIG 157.0 (H) 07/23/2017   CHOLHDL 4 07/23/2017            Hypertension: His blood pressure is managed by nephrologist and PCP Appears to have been taken off his blood pressure medications and started on midodrine at some point  RENAL: He has had a transplant in 2009 and  he is on dialysis  Neurological: Has has had  decreased sensation in his feet, also complaining of more tingling and burning Previously followed by neurologist  He has had marked decrease in his balance  and also some weakness in his legs, using a power scooter  Last foot exam was in 02/2017: Absent monofilament sensation in his feet and toes and extending up to the middle of the lower leg on the left and up to the ankle on the right  LABS:  Office Visit on 07/04/2018  Component Date Value Ref Range Status  . Hemoglobin A1C 07/05/2018 7.4* 4.0 - 5.6 % Final    Physical Examination:  BP 128/86   Pulse 64   Ht _0  (1.778 m)   Wt (!) 319 lb (144.7 kg)   BMI 45.77 kg/m      ASSESSMENT:  Diabetes type 2, long-standing with obesity See history of  present illness for detailed discussion of his current management, blood sugar patterns and problems identified.  His A1c is 7.4 although previously higher at 7.9   Not clear why he continues to have difficulty getting his glucose strips for testing, apparently also still having problems with the pharmacy filing under the wrong Medicare benefit Without knowing what his blood sugar patterns are difficult to adjust his insulin He does appear to be needing more mealtime coverage at breakfast and lunch at least Also because of low normal blood sugars reportedly in the mornings he will need to reduce his evening Toujeo which he did not do on his last visit as discussed   Neurological symptoms: He has had previous evaluation by Dr. Jannifer Franklin and recommend that he go back  HYPERTENSION: Followed by nephrologist, blood pressure is fairly well controlled  PLAN:   As above  Needs to check his blood sugars 4 times a day  If documented frequency of monitoring may consider freestyle Elenor Legato  He needs to take his Humulin R 30 minutes before eating and increase the dose to 40 units before lunch and dinner is also 35 before breakfast  More regular follow-up   Patient Instructions  Toujeo continue 80 units in the morning and also to reduce to 70 units in PM  HUMULIN R: Take 35 units before breakfast Take 40 units before lunch and dinner        Elayne Snare 07/05/2018, 8:48 PM   Note: This office note was prepared with Dragon voice recognition system technology. Any transcriptional errors that result from this process are unintentional.

## 2018-07-04 NOTE — Patient Instructions (Addendum)
Toujeo continue 80 units in the morning and also to reduce to 70 units in PM  HUMULIN R: Take 35 units before breakfast Take 40 units before lunch and dinner

## 2018-07-05 ENCOUNTER — Ambulatory Visit (INDEPENDENT_AMBULATORY_CARE_PROVIDER_SITE_OTHER): Payer: Medicare Other | Admitting: General Practice

## 2018-07-05 ENCOUNTER — Encounter: Payer: Self-pay | Admitting: Endocrinology

## 2018-07-05 ENCOUNTER — Telehealth: Payer: Self-pay | Admitting: General Practice

## 2018-07-05 DIAGNOSIS — N2581 Secondary hyperparathyroidism of renal origin: Secondary | ICD-10-CM | POA: Diagnosis not present

## 2018-07-05 DIAGNOSIS — Z7901 Long term (current) use of anticoagulants: Secondary | ICD-10-CM

## 2018-07-05 DIAGNOSIS — N186 End stage renal disease: Secondary | ICD-10-CM | POA: Diagnosis not present

## 2018-07-05 DIAGNOSIS — E1122 Type 2 diabetes mellitus with diabetic chronic kidney disease: Secondary | ICD-10-CM | POA: Diagnosis not present

## 2018-07-05 LAB — POCT GLYCOSYLATED HEMOGLOBIN (HGB A1C): Hemoglobin A1C: 7.4 % — AB (ref 4.0–5.6)

## 2018-07-05 NOTE — Patient Instructions (Signed)
Pre visit review using our clinic review tool, if applicable. No additional management support is needed unless otherwise documented below in the visit note.  Received faxed results on 9/24.  Take 1 1/2 tablets of coumadin (7.5 mg) and then continue to take 1 tablet daily except 1/2 tablet on Wednesdays and Saturdays. Re-check in 1 week. Instructions given to patient.  Dialysis patient.

## 2018-07-05 NOTE — Telephone Encounter (Signed)
-----   Message from Forde Dandy, PharmD sent at 07/05/2018  3:15 PM EDT ----- Regarding: RE: Warfarin + Allopurinol  Thank you so much Caren Griffins!   ----- Message ----- From: Warden Fillers, RN Sent: 07/05/2018  12:55 PM EDT To: Forde Dandy, PharmD Subject: RE: Warfarin + Allopurinol                     Hi Anderson Malta,  I received an INR of 1.85 today that was faxed over from the [place].  It was drawn on 9/19 so it looks like the allopurinol is not having any effect yet anyway.  I usually get an INR on a weekly basis.  Thank you for letting me know! ----- Message ----- From: Forde Dandy, PharmD Sent: 07/05/2018  12:40 PM EDT To: Warden Fillers, RN Subject: FW: Warfarin + Allopurinol                     Hi Caren Griffins, we just wanted to make you aware of this patient. He was switched to allopurinol on 9/5 which may increase INR. He is past due for follow up with you. Please let me know how I can help.  ----- Message ----- From: Winfred Burn Sent: 07/04/2018  11:21 AM EDT To: Forde Dandy, PharmD Subject: Warfarin + Allopurinol                         Hi Delsa Sale!  This patient was started on allopurinol on 9/5. He does not have a recent INR since the change. The most recent INR I found in his labs is from 8/16 of 2.2.   Just wanted to put this on your radar!

## 2018-07-07 DIAGNOSIS — N2581 Secondary hyperparathyroidism of renal origin: Secondary | ICD-10-CM | POA: Diagnosis not present

## 2018-07-07 DIAGNOSIS — I82492 Acute embolism and thrombosis of other specified deep vein of left lower extremity: Secondary | ICD-10-CM | POA: Diagnosis not present

## 2018-07-07 DIAGNOSIS — N186 End stage renal disease: Secondary | ICD-10-CM | POA: Diagnosis not present

## 2018-07-07 DIAGNOSIS — E1122 Type 2 diabetes mellitus with diabetic chronic kidney disease: Secondary | ICD-10-CM | POA: Diagnosis not present

## 2018-07-08 LAB — PROTIME-INR: INR: 2.7 — AB (ref <=1.1)

## 2018-07-09 DIAGNOSIS — N2581 Secondary hyperparathyroidism of renal origin: Secondary | ICD-10-CM | POA: Diagnosis not present

## 2018-07-09 DIAGNOSIS — E1122 Type 2 diabetes mellitus with diabetic chronic kidney disease: Secondary | ICD-10-CM | POA: Diagnosis not present

## 2018-07-09 DIAGNOSIS — N186 End stage renal disease: Secondary | ICD-10-CM | POA: Diagnosis not present

## 2018-07-12 ENCOUNTER — Ambulatory Visit (INDEPENDENT_AMBULATORY_CARE_PROVIDER_SITE_OTHER): Payer: Medicare Other | Admitting: General Practice

## 2018-07-12 DIAGNOSIS — N2581 Secondary hyperparathyroidism of renal origin: Secondary | ICD-10-CM | POA: Diagnosis not present

## 2018-07-12 DIAGNOSIS — Z7901 Long term (current) use of anticoagulants: Secondary | ICD-10-CM | POA: Diagnosis not present

## 2018-07-12 DIAGNOSIS — Z992 Dependence on renal dialysis: Secondary | ICD-10-CM | POA: Diagnosis not present

## 2018-07-12 DIAGNOSIS — E1122 Type 2 diabetes mellitus with diabetic chronic kidney disease: Secondary | ICD-10-CM | POA: Diagnosis not present

## 2018-07-12 DIAGNOSIS — D631 Anemia in chronic kidney disease: Secondary | ICD-10-CM | POA: Diagnosis not present

## 2018-07-12 DIAGNOSIS — N186 End stage renal disease: Secondary | ICD-10-CM | POA: Diagnosis not present

## 2018-07-12 DIAGNOSIS — Z23 Encounter for immunization: Secondary | ICD-10-CM | POA: Diagnosis not present

## 2018-07-12 NOTE — Progress Notes (Signed)
Medical treatment/procedure(s) were performed by non-physician practitioner and as supervising physician I was immediately available for consultation/collaboration. I agree with above. Rue Tinnel A Finleigh Cheong, MD  

## 2018-07-12 NOTE — Patient Instructions (Signed)
Pre visit review using our clinic review tool, if applicable. No additional management support is needed unless otherwise documented below in the visit note.  Received faxed results on 10/1.  Continue to take 1 tablet daily except 1/2 tablet on Wednesdays and Saturdays. Re-check in 1 week. Instructions left on patient's VM.  Dialysis patient.

## 2018-07-14 DIAGNOSIS — N2581 Secondary hyperparathyroidism of renal origin: Secondary | ICD-10-CM | POA: Diagnosis not present

## 2018-07-14 DIAGNOSIS — I82492 Acute embolism and thrombosis of other specified deep vein of left lower extremity: Secondary | ICD-10-CM | POA: Diagnosis not present

## 2018-07-14 DIAGNOSIS — N186 End stage renal disease: Secondary | ICD-10-CM | POA: Diagnosis not present

## 2018-07-14 DIAGNOSIS — D631 Anemia in chronic kidney disease: Secondary | ICD-10-CM | POA: Diagnosis not present

## 2018-07-14 DIAGNOSIS — Z23 Encounter for immunization: Secondary | ICD-10-CM | POA: Diagnosis not present

## 2018-07-14 DIAGNOSIS — E1129 Type 2 diabetes mellitus with other diabetic kidney complication: Secondary | ICD-10-CM | POA: Diagnosis not present

## 2018-07-14 DIAGNOSIS — M109 Gout, unspecified: Secondary | ICD-10-CM | POA: Diagnosis not present

## 2018-07-14 DIAGNOSIS — E1122 Type 2 diabetes mellitus with diabetic chronic kidney disease: Secondary | ICD-10-CM | POA: Diagnosis not present

## 2018-07-14 LAB — PROTIME-INR: INR: 3 — AB (ref <=1.1)

## 2018-07-15 ENCOUNTER — Encounter: Payer: Self-pay | Admitting: Internal Medicine

## 2018-07-15 ENCOUNTER — Ambulatory Visit (INDEPENDENT_AMBULATORY_CARE_PROVIDER_SITE_OTHER): Payer: Medicare Other | Admitting: Internal Medicine

## 2018-07-15 ENCOUNTER — Ambulatory Visit: Payer: Self-pay | Admitting: General Practice

## 2018-07-15 VITALS — BP 130/60 | HR 113 | Temp 98.3°F | Ht 70.0 in

## 2018-07-15 DIAGNOSIS — F419 Anxiety disorder, unspecified: Secondary | ICD-10-CM

## 2018-07-15 DIAGNOSIS — F5101 Primary insomnia: Secondary | ICD-10-CM | POA: Diagnosis not present

## 2018-07-15 MED ORDER — SUVOREXANT 10 MG PO TABS: 10.0000 mg | ORAL_TABLET | Freq: Every day | ORAL | 1 refills | Status: DC

## 2018-07-15 NOTE — Assessment & Plan Note (Signed)
He does want some suggestions for during dialysis and we talked about deep breathing and meditation. He cannot afford counseling. During our entire visit he did not mention using clonazepam but he does get consistent rx for this for BID prn from another provider. He should not get another rx for sedating medication.

## 2018-07-15 NOTE — Progress Notes (Signed)
   Subjective:    Patient ID: [name] [name], male    DOB: 1966/04/26, 52 y.o.   MRN: [id number]  HPI The patient is a 52 YO man coming in for several concerns including anxiety (was fidgety in dialysis recently and gets some of these episodes at home as well, tries to think of other things and sometimes successful, one of his stress reducers is eating, denies SI/HI) and insomnia (cannot sleep at night due to restlessness, some increased thoughts, denies taking anything for it, does have some depression and anxiety about his health currently), and weight (needs a note to get financial assistance for membership at the [employer] so he can work out and lose weight as he hopes of getting on kidney transplant list and needs to lose weight for that to happen).   Review of Systems  Constitutional: Negative.   HENT: Negative.   Eyes: Negative.   Respiratory: Negative for cough, chest tightness and shortness of breath.   Cardiovascular: Negative for chest pain, palpitations and leg swelling.  Gastrointestinal: Negative for abdominal distention, abdominal pain, constipation, diarrhea, nausea and vomiting.  Musculoskeletal: Negative.   Skin: Negative.   Neurological: Negative.   Psychiatric/Behavioral: Positive for sleep disturbance. Negative for behavioral problems, confusion, decreased concentration, self-injury and suicidal ideas. The patient is nervous/anxious.       Objective:   Physical Exam  Constitutional: He is oriented to person, place, and time. He appears well-developed and well-nourished.  overweight  HENT:  Head: Normocephalic and atraumatic.  Eyes: EOM are normal.  Neck: Normal range of motion.  Cardiovascular: Normal rate and regular rhythm.  Pulmonary/Chest: Effort normal and breath sounds normal. No respiratory distress. He has no wheezes. He has no rales.  Abdominal: Soft. Bowel sounds are normal. He exhibits no distension. There is no tenderness. There is no rebound.    Musculoskeletal: He exhibits no edema.  Neurological: He is alert and oriented to person, place, and time. Coordination normal.  Skin: Skin is warm and dry.  Psychiatric: He has a normal mood and affect.   Vitals:   07/15/18 1548  BP: 130/60  Pulse: (!) 113  Temp: 98.3 F (36.8 C)  TempSrc: Oral  SpO2: 94%  Height: 5\' 10"  (1.778 m)      Assessment & Plan:

## 2018-07-15 NOTE — Patient Instructions (Signed)
Pre visit review using our clinic review tool, if applicable. No additional management support is needed unless otherwise documented below in the visit note.  Received faxed results on 10/4  Continue to take 1 tablet daily except 1/2 tablet on Wednesdays and Saturdays. Re-check in 1 week. Instructions left on patient's VM.  Dialysis patient.

## 2018-07-15 NOTE — Assessment & Plan Note (Signed)
Rx for belsomra.

## 2018-07-15 NOTE — Progress Notes (Signed)
Medical treatment/procedure(s) were performed by non-physician practitioner and as supervising physician I was immediately available for consultation/collaboration. I agree with above. Slade Pierpoint A Luisantonio Adinolfi, MD  

## 2018-07-15 NOTE — Patient Instructions (Addendum)
Work on trying some deep breathing techniques to help during dialysis or distracting yourself thinking about other things.   We have sent in belsomra to take 1 pill about 30 minutes before bedtime.   Stress and Stress Management Stress is a normal reaction to life events. It is what you feel when life demands more than you are used to or more than you can handle. Some stress can be useful. For example, the stress reaction can help you catch the last bus of the day, study for a test, or meet a deadline at work. But stress that occurs too often or for too long can cause problems. It can affect your emotional health and interfere with relationships and normal daily activities. Too much stress can weaken your immune system and increase your risk for physical illness. If you already have a medical problem, stress can make it worse. What are the causes? All sorts of life events may cause stress. An event that causes stress for one person may not be stressful for another person. Major life events commonly cause stress. These may be positive or negative. Examples include losing your job, moving into a new home, getting married, having a baby, or losing a loved one. Less obvious life events may also cause stress, especially if they occur day after day or in combination. Examples include working long hours, driving in traffic, caring for children, being in debt, or being in a difficult relationship. What are the signs or symptoms? Stress may cause emotional symptoms including, the following:  Anxiety. This is feeling worried, afraid, on edge, overwhelmed, or out of control.  Anger. This is feeling irritated or impatient.  Depression. This is feeling sad, down, helpless, or guilty.  Difficulty focusing, remembering, or making decisions.  Stress may cause physical symptoms, including the following:  Aches and pains. These may affect your head, neck, back, stomach, or other areas of your body.  Tight  muscles or clenched jaw.  Low energy or trouble sleeping.  Stress may cause unhealthy behaviors, including the following:  Eating to feel better (overeating) or skipping meals.  Sleeping too little, too much, or both.  Working too much or putting off tasks (procrastination).  Smoking, drinking alcohol, or using drugs to feel better.  How is this diagnosed? Stress is diagnosed through an assessment by your health care provider. Your health care provider will ask questions about your symptoms and any stressful life events.Your health care provider will also ask about your medical history and may order blood tests or other tests. Certain medical conditions and medicine can cause physical symptoms similar to stress. Mental illness can cause emotional symptoms and unhealthy behaviors similar to stress. Your health care provider may refer you to a mental health professional for further evaluation. How is this treated? Stress management is the recommended treatment for stress.The goals of stress management are reducing stressful life events and coping with stress in healthy ways. Techniques for reducing stressful life events include the following:  Stress identification. Self-monitor for stress and identify what causes stress for you. These skills may help you to avoid some stressful events.  Time management. Set your priorities, keep a calendar of events, and learn to say "no." These tools can help you avoid making too many commitments.  Techniques for coping with stress include the following:  Rethinking the problem. Try to think realistically about stressful events rather than ignoring them or overreacting. Try to find the positives in a stressful situation rather than focusing on  the negatives.  Exercise. Physical exercise can release both physical and emotional tension. The key is to find a form of exercise you enjoy and do it regularly.  Relaxation techniques. These relax the body and  mind. Examples include yoga, meditation, tai chi, biofeedback, deep breathing, progressive muscle relaxation, listening to music, being out in nature, journaling, and other hobbies. Again, the key is to find one or more that you enjoy and can do regularly.  Healthy lifestyle. Eat a balanced diet, get plenty of sleep, and do not smoke. Avoid using alcohol or drugs to relax.  Strong support network. Spend time with family, friends, or other people you enjoy being around.Express your feelings and talk things over with someone you trust.  Counseling or talktherapy with a mental health professional may be helpful if you are having difficulty managing stress on your own. Medicine is typically not recommended for the treatment of stress.Talk to your health care provider if you think you need medicine for symptoms of stress. Follow these instructions at home:  Keep all follow-up visits as directed by your health care provider.  Take all medicines as directed by your health care provider. Contact a health care provider if:  Your symptoms get worse or you start having new symptoms.  You feel overwhelmed by your problems and can no longer manage them on your own. Get help right away if:  You feel like hurting yourself or someone else. This information is not intended to replace advice given to you by your health care provider. Make sure you discuss any questions you have with your health care provider. Document Released: 03/24/2001 Document Revised: 03/05/2016 Document Reviewed: 05/23/2013 Elsevier Interactive Patient Education  2017 [employer].

## 2018-07-15 NOTE — Assessment & Plan Note (Signed)
Note given that he can work out at [employer] and I feel that this would be beneficial to his health. He does use food as a stress reducer and we talked about other ways to handle stress and anxiety.

## 2018-07-16 DIAGNOSIS — D631 Anemia in chronic kidney disease: Secondary | ICD-10-CM | POA: Diagnosis not present

## 2018-07-16 DIAGNOSIS — N186 End stage renal disease: Secondary | ICD-10-CM | POA: Diagnosis not present

## 2018-07-16 DIAGNOSIS — E1122 Type 2 diabetes mellitus with diabetic chronic kidney disease: Secondary | ICD-10-CM | POA: Diagnosis not present

## 2018-07-16 DIAGNOSIS — N2581 Secondary hyperparathyroidism of renal origin: Secondary | ICD-10-CM | POA: Diagnosis not present

## 2018-07-16 DIAGNOSIS — Z23 Encounter for immunization: Secondary | ICD-10-CM | POA: Diagnosis not present

## 2018-07-18 ENCOUNTER — Telehealth: Payer: Self-pay | Admitting: Endocrinology

## 2018-07-18 NOTE — Telephone Encounter (Signed)
-----   Message from Elayne Snare, MD sent at 07/05/2018  8:54 PM EDT ----- Regarding: Neurology consultation He had asked for neurology consultation but let him know that since he already has seen Dr. Jannifer Franklin at [facility] neurology he needs to go back there and not see a new physician

## 2018-07-18 NOTE — Telephone Encounter (Signed)
Patient stated he has asked for Dr Dwyane Dee to send a referral to Neurology. And would like to know why he hasn't heard from anyone about this.   Please advise

## 2018-07-18 NOTE — Telephone Encounter (Signed)
Notified pt to call  Dr. Jannifer Franklin at [facility] Neurology for an appt. Since establish already pt.

## 2018-07-18 NOTE — Telephone Encounter (Signed)
-----   Message from Elayne Snare, MD sent at 07/05/2018  8:54 PM EDT ----- Regarding: Neurology consultation He had asked for neurology consultation but let him know that since he already has seen Dr. Jannifer Franklin at [facility] neurology he needs to go back there and not see a new physician

## 2018-07-19 DIAGNOSIS — E1122 Type 2 diabetes mellitus with diabetic chronic kidney disease: Secondary | ICD-10-CM | POA: Diagnosis not present

## 2018-07-19 DIAGNOSIS — N186 End stage renal disease: Secondary | ICD-10-CM | POA: Diagnosis not present

## 2018-07-19 DIAGNOSIS — D631 Anemia in chronic kidney disease: Secondary | ICD-10-CM | POA: Diagnosis not present

## 2018-07-19 DIAGNOSIS — Z23 Encounter for immunization: Secondary | ICD-10-CM | POA: Diagnosis not present

## 2018-07-19 DIAGNOSIS — N2581 Secondary hyperparathyroidism of renal origin: Secondary | ICD-10-CM | POA: Diagnosis not present

## 2018-07-21 DIAGNOSIS — D631 Anemia in chronic kidney disease: Secondary | ICD-10-CM | POA: Diagnosis not present

## 2018-07-21 DIAGNOSIS — E1122 Type 2 diabetes mellitus with diabetic chronic kidney disease: Secondary | ICD-10-CM | POA: Diagnosis not present

## 2018-07-21 DIAGNOSIS — I82492 Acute embolism and thrombosis of other specified deep vein of left lower extremity: Secondary | ICD-10-CM | POA: Diagnosis not present

## 2018-07-21 DIAGNOSIS — N2581 Secondary hyperparathyroidism of renal origin: Secondary | ICD-10-CM | POA: Diagnosis not present

## 2018-07-21 DIAGNOSIS — Z23 Encounter for immunization: Secondary | ICD-10-CM | POA: Diagnosis not present

## 2018-07-21 DIAGNOSIS — N186 End stage renal disease: Secondary | ICD-10-CM | POA: Diagnosis not present

## 2018-07-21 LAB — PROTIME-INR: INR: 1.5 — AB (ref <=1.1)

## 2018-07-23 ENCOUNTER — Other Ambulatory Visit: Payer: Self-pay | Admitting: Internal Medicine

## 2018-07-23 ENCOUNTER — Other Ambulatory Visit: Payer: Self-pay | Admitting: Endocrinology

## 2018-07-23 DIAGNOSIS — N2581 Secondary hyperparathyroidism of renal origin: Secondary | ICD-10-CM | POA: Diagnosis not present

## 2018-07-23 DIAGNOSIS — D631 Anemia in chronic kidney disease: Secondary | ICD-10-CM | POA: Diagnosis not present

## 2018-07-23 DIAGNOSIS — Z23 Encounter for immunization: Secondary | ICD-10-CM | POA: Diagnosis not present

## 2018-07-23 DIAGNOSIS — E1122 Type 2 diabetes mellitus with diabetic chronic kidney disease: Secondary | ICD-10-CM | POA: Diagnosis not present

## 2018-07-23 DIAGNOSIS — N186 End stage renal disease: Secondary | ICD-10-CM | POA: Diagnosis not present

## 2018-07-26 ENCOUNTER — Ambulatory Visit (INDEPENDENT_AMBULATORY_CARE_PROVIDER_SITE_OTHER): Payer: Medicare Other | Admitting: General Practice

## 2018-07-26 DIAGNOSIS — Z7901 Long term (current) use of anticoagulants: Secondary | ICD-10-CM | POA: Diagnosis not present

## 2018-07-26 DIAGNOSIS — D631 Anemia in chronic kidney disease: Secondary | ICD-10-CM | POA: Diagnosis not present

## 2018-07-26 DIAGNOSIS — N2581 Secondary hyperparathyroidism of renal origin: Secondary | ICD-10-CM | POA: Diagnosis not present

## 2018-07-26 DIAGNOSIS — N186 End stage renal disease: Secondary | ICD-10-CM | POA: Diagnosis not present

## 2018-07-26 DIAGNOSIS — E1122 Type 2 diabetes mellitus with diabetic chronic kidney disease: Secondary | ICD-10-CM | POA: Diagnosis not present

## 2018-07-26 DIAGNOSIS — Z23 Encounter for immunization: Secondary | ICD-10-CM | POA: Diagnosis not present

## 2018-07-26 NOTE — Progress Notes (Signed)
Medical treatment/procedure(s) were performed by non-physician practitioner and as supervising physician I was immediately available for consultation/collaboration. I agree with above. Chyrl Elwell A Ananias Kolander, MD  

## 2018-07-26 NOTE — Patient Instructions (Signed)
Pre visit review using our clinic review tool, if applicable. No additional management support is needed unless otherwise documented below in the visit note.  Received faxed results on 10/15.  Take 1 1/2 tablets today (10/15) and 1 tablet tomorrow (10/16) and then continue to take 1 tablet daily except 1/2 tablet on Wednesdays and Saturdays. Re-check in 1 week. Spoke with patient and he verbalized understanding.  Dialysis patient.

## 2018-07-28 DIAGNOSIS — Z23 Encounter for immunization: Secondary | ICD-10-CM | POA: Diagnosis not present

## 2018-07-28 DIAGNOSIS — E1122 Type 2 diabetes mellitus with diabetic chronic kidney disease: Secondary | ICD-10-CM | POA: Diagnosis not present

## 2018-07-28 DIAGNOSIS — I82492 Acute embolism and thrombosis of other specified deep vein of left lower extremity: Secondary | ICD-10-CM | POA: Diagnosis not present

## 2018-07-28 DIAGNOSIS — N186 End stage renal disease: Secondary | ICD-10-CM | POA: Diagnosis not present

## 2018-07-28 DIAGNOSIS — N2581 Secondary hyperparathyroidism of renal origin: Secondary | ICD-10-CM | POA: Diagnosis not present

## 2018-07-28 DIAGNOSIS — D631 Anemia in chronic kidney disease: Secondary | ICD-10-CM | POA: Diagnosis not present

## 2018-07-30 DIAGNOSIS — D631 Anemia in chronic kidney disease: Secondary | ICD-10-CM | POA: Diagnosis not present

## 2018-07-30 DIAGNOSIS — N186 End stage renal disease: Secondary | ICD-10-CM | POA: Diagnosis not present

## 2018-07-30 DIAGNOSIS — E1122 Type 2 diabetes mellitus with diabetic chronic kidney disease: Secondary | ICD-10-CM | POA: Diagnosis not present

## 2018-07-30 DIAGNOSIS — Z23 Encounter for immunization: Secondary | ICD-10-CM | POA: Diagnosis not present

## 2018-07-30 DIAGNOSIS — N2581 Secondary hyperparathyroidism of renal origin: Secondary | ICD-10-CM | POA: Diagnosis not present

## 2018-08-02 DIAGNOSIS — N2581 Secondary hyperparathyroidism of renal origin: Secondary | ICD-10-CM | POA: Diagnosis not present

## 2018-08-02 DIAGNOSIS — E1122 Type 2 diabetes mellitus with diabetic chronic kidney disease: Secondary | ICD-10-CM | POA: Diagnosis not present

## 2018-08-02 DIAGNOSIS — Z23 Encounter for immunization: Secondary | ICD-10-CM | POA: Diagnosis not present

## 2018-08-02 DIAGNOSIS — N186 End stage renal disease: Secondary | ICD-10-CM | POA: Diagnosis not present

## 2018-08-02 DIAGNOSIS — D631 Anemia in chronic kidney disease: Secondary | ICD-10-CM | POA: Diagnosis not present

## 2018-08-04 DIAGNOSIS — Z23 Encounter for immunization: Secondary | ICD-10-CM | POA: Diagnosis not present

## 2018-08-04 DIAGNOSIS — I82492 Acute embolism and thrombosis of other specified deep vein of left lower extremity: Secondary | ICD-10-CM | POA: Diagnosis not present

## 2018-08-04 DIAGNOSIS — N2581 Secondary hyperparathyroidism of renal origin: Secondary | ICD-10-CM | POA: Diagnosis not present

## 2018-08-04 DIAGNOSIS — E1122 Type 2 diabetes mellitus with diabetic chronic kidney disease: Secondary | ICD-10-CM | POA: Diagnosis not present

## 2018-08-04 DIAGNOSIS — N186 End stage renal disease: Secondary | ICD-10-CM | POA: Diagnosis not present

## 2018-08-04 DIAGNOSIS — D631 Anemia in chronic kidney disease: Secondary | ICD-10-CM | POA: Diagnosis not present

## 2018-08-05 LAB — PROTIME-INR: INR: 3 — AB (ref <=1.1)

## 2018-08-06 DIAGNOSIS — Z23 Encounter for immunization: Secondary | ICD-10-CM | POA: Diagnosis not present

## 2018-08-06 DIAGNOSIS — N2581 Secondary hyperparathyroidism of renal origin: Secondary | ICD-10-CM | POA: Diagnosis not present

## 2018-08-06 DIAGNOSIS — E1122 Type 2 diabetes mellitus with diabetic chronic kidney disease: Secondary | ICD-10-CM | POA: Diagnosis not present

## 2018-08-06 DIAGNOSIS — D631 Anemia in chronic kidney disease: Secondary | ICD-10-CM | POA: Diagnosis not present

## 2018-08-06 DIAGNOSIS — N186 End stage renal disease: Secondary | ICD-10-CM | POA: Diagnosis not present

## 2018-08-08 DIAGNOSIS — R635 Abnormal weight gain: Secondary | ICD-10-CM | POA: Diagnosis not present

## 2018-08-08 DIAGNOSIS — R5383 Other fatigue: Secondary | ICD-10-CM | POA: Diagnosis not present

## 2018-08-08 DIAGNOSIS — R9431 Abnormal electrocardiogram [ECG] [EKG]: Secondary | ICD-10-CM | POA: Diagnosis not present

## 2018-08-08 DIAGNOSIS — R0602 Shortness of breath: Secondary | ICD-10-CM | POA: Diagnosis not present

## 2018-08-09 ENCOUNTER — Ambulatory Visit (INDEPENDENT_AMBULATORY_CARE_PROVIDER_SITE_OTHER): Payer: Medicare Other | Admitting: General Practice

## 2018-08-09 DIAGNOSIS — Z7901 Long term (current) use of anticoagulants: Secondary | ICD-10-CM | POA: Diagnosis not present

## 2018-08-09 DIAGNOSIS — N2581 Secondary hyperparathyroidism of renal origin: Secondary | ICD-10-CM | POA: Diagnosis not present

## 2018-08-09 DIAGNOSIS — D631 Anemia in chronic kidney disease: Secondary | ICD-10-CM | POA: Diagnosis not present

## 2018-08-09 DIAGNOSIS — L870 Keratosis follicularis et parafollicularis in cutem penetrans: Secondary | ICD-10-CM | POA: Diagnosis not present

## 2018-08-09 DIAGNOSIS — E1122 Type 2 diabetes mellitus with diabetic chronic kidney disease: Secondary | ICD-10-CM | POA: Diagnosis not present

## 2018-08-09 DIAGNOSIS — Z23 Encounter for immunization: Secondary | ICD-10-CM | POA: Diagnosis not present

## 2018-08-09 DIAGNOSIS — R21 Rash and other nonspecific skin eruption: Secondary | ICD-10-CM | POA: Diagnosis not present

## 2018-08-09 DIAGNOSIS — N186 End stage renal disease: Secondary | ICD-10-CM | POA: Diagnosis not present

## 2018-08-09 LAB — PROTIME-INR: INR: 2 — AB (ref <=1.1)

## 2018-08-09 NOTE — Progress Notes (Signed)
Medical treatment/procedure(s) were performed by non-physician practitioner and as supervising physician I was immediately available for consultation/collaboration. I agree with above. Elizabeth A Crawford, MD  

## 2018-08-09 NOTE — Patient Instructions (Signed)
Pre visit review using our clinic review tool, if applicable. No additional management support is needed unless otherwise documented below in the visit note.  Received faxed results on 10/29.  Continue to take 1 tablet daily except 1/2 tablet on Wednesdays and Saturdays. Re-check in 1 week. Spoke with patient and he verbalized understanding.  Dialysis patient.

## 2018-08-10 DIAGNOSIS — R0602 Shortness of breath: Secondary | ICD-10-CM | POA: Diagnosis not present

## 2018-08-10 DIAGNOSIS — R9431 Abnormal electrocardiogram [ECG] [EKG]: Secondary | ICD-10-CM | POA: Diagnosis not present

## 2018-08-11 DIAGNOSIS — Z23 Encounter for immunization: Secondary | ICD-10-CM | POA: Diagnosis not present

## 2018-08-11 DIAGNOSIS — I82492 Acute embolism and thrombosis of other specified deep vein of left lower extremity: Secondary | ICD-10-CM | POA: Diagnosis not present

## 2018-08-11 DIAGNOSIS — N2581 Secondary hyperparathyroidism of renal origin: Secondary | ICD-10-CM | POA: Diagnosis not present

## 2018-08-11 DIAGNOSIS — E1122 Type 2 diabetes mellitus with diabetic chronic kidney disease: Secondary | ICD-10-CM | POA: Diagnosis not present

## 2018-08-11 DIAGNOSIS — N186 End stage renal disease: Secondary | ICD-10-CM | POA: Diagnosis not present

## 2018-08-11 DIAGNOSIS — D631 Anemia in chronic kidney disease: Secondary | ICD-10-CM | POA: Diagnosis not present

## 2018-08-12 DIAGNOSIS — E1122 Type 2 diabetes mellitus with diabetic chronic kidney disease: Secondary | ICD-10-CM | POA: Diagnosis not present

## 2018-08-12 DIAGNOSIS — N186 End stage renal disease: Secondary | ICD-10-CM | POA: Diagnosis not present

## 2018-08-12 DIAGNOSIS — Z992 Dependence on renal dialysis: Secondary | ICD-10-CM | POA: Diagnosis not present

## 2018-08-13 DIAGNOSIS — E1122 Type 2 diabetes mellitus with diabetic chronic kidney disease: Secondary | ICD-10-CM | POA: Diagnosis not present

## 2018-08-13 DIAGNOSIS — N2581 Secondary hyperparathyroidism of renal origin: Secondary | ICD-10-CM | POA: Diagnosis not present

## 2018-08-13 DIAGNOSIS — D631 Anemia in chronic kidney disease: Secondary | ICD-10-CM | POA: Diagnosis not present

## 2018-08-13 DIAGNOSIS — N186 End stage renal disease: Secondary | ICD-10-CM | POA: Diagnosis not present

## 2018-08-16 DIAGNOSIS — N186 End stage renal disease: Secondary | ICD-10-CM | POA: Diagnosis not present

## 2018-08-16 DIAGNOSIS — D631 Anemia in chronic kidney disease: Secondary | ICD-10-CM | POA: Diagnosis not present

## 2018-08-16 DIAGNOSIS — N2581 Secondary hyperparathyroidism of renal origin: Secondary | ICD-10-CM | POA: Diagnosis not present

## 2018-08-16 DIAGNOSIS — E1122 Type 2 diabetes mellitus with diabetic chronic kidney disease: Secondary | ICD-10-CM | POA: Diagnosis not present

## 2018-08-17 DIAGNOSIS — R635 Abnormal weight gain: Secondary | ICD-10-CM | POA: Diagnosis not present

## 2018-08-17 DIAGNOSIS — E119 Type 2 diabetes mellitus without complications: Secondary | ICD-10-CM | POA: Diagnosis not present

## 2018-08-18 DIAGNOSIS — N186 End stage renal disease: Secondary | ICD-10-CM | POA: Diagnosis not present

## 2018-08-18 DIAGNOSIS — D631 Anemia in chronic kidney disease: Secondary | ICD-10-CM | POA: Diagnosis not present

## 2018-08-18 DIAGNOSIS — M109 Gout, unspecified: Secondary | ICD-10-CM | POA: Diagnosis not present

## 2018-08-18 DIAGNOSIS — E1122 Type 2 diabetes mellitus with diabetic chronic kidney disease: Secondary | ICD-10-CM | POA: Diagnosis not present

## 2018-08-18 DIAGNOSIS — N2581 Secondary hyperparathyroidism of renal origin: Secondary | ICD-10-CM | POA: Diagnosis not present

## 2018-08-18 DIAGNOSIS — I82492 Acute embolism and thrombosis of other specified deep vein of left lower extremity: Secondary | ICD-10-CM | POA: Diagnosis not present

## 2018-08-20 DIAGNOSIS — N2581 Secondary hyperparathyroidism of renal origin: Secondary | ICD-10-CM | POA: Diagnosis not present

## 2018-08-20 DIAGNOSIS — E1122 Type 2 diabetes mellitus with diabetic chronic kidney disease: Secondary | ICD-10-CM | POA: Diagnosis not present

## 2018-08-20 DIAGNOSIS — D631 Anemia in chronic kidney disease: Secondary | ICD-10-CM | POA: Diagnosis not present

## 2018-08-20 DIAGNOSIS — N186 End stage renal disease: Secondary | ICD-10-CM | POA: Diagnosis not present

## 2018-08-23 ENCOUNTER — Ambulatory Visit (INDEPENDENT_AMBULATORY_CARE_PROVIDER_SITE_OTHER): Payer: Medicare Other | Admitting: General Practice

## 2018-08-23 DIAGNOSIS — N2581 Secondary hyperparathyroidism of renal origin: Secondary | ICD-10-CM | POA: Diagnosis not present

## 2018-08-23 DIAGNOSIS — Z7901 Long term (current) use of anticoagulants: Secondary | ICD-10-CM | POA: Diagnosis not present

## 2018-08-23 DIAGNOSIS — E1122 Type 2 diabetes mellitus with diabetic chronic kidney disease: Secondary | ICD-10-CM | POA: Diagnosis not present

## 2018-08-23 DIAGNOSIS — N186 End stage renal disease: Secondary | ICD-10-CM | POA: Diagnosis not present

## 2018-08-23 DIAGNOSIS — D631 Anemia in chronic kidney disease: Secondary | ICD-10-CM | POA: Diagnosis not present

## 2018-08-23 LAB — POCT INR: INR: 1.7 — AB (ref 2.0–3.0)

## 2018-08-23 NOTE — Progress Notes (Signed)
Medical treatment/procedure(s) were performed by non-physician practitioner and as supervising physician I was immediately available for consultation/collaboration. I agree with above. Dhanya Bogle A Morayo Leven, MD  

## 2018-08-23 NOTE — Patient Instructions (Signed)
Pre visit review using our clinic review tool, if applicable. No additional management support is needed unless otherwise documented below in the visit note.  Received faxed results on 11/12. Take extra 1/2 tablet today and then continue to take 1 tablet daily except 1/2 tablet on Wednesdays and Saturdays. Re-check in 1 week. Spoke with patient and he verbalized understanding.  Dialysis patient.

## 2018-08-25 DIAGNOSIS — D631 Anemia in chronic kidney disease: Secondary | ICD-10-CM | POA: Diagnosis not present

## 2018-08-25 DIAGNOSIS — E1122 Type 2 diabetes mellitus with diabetic chronic kidney disease: Secondary | ICD-10-CM | POA: Diagnosis not present

## 2018-08-25 DIAGNOSIS — N2581 Secondary hyperparathyroidism of renal origin: Secondary | ICD-10-CM | POA: Diagnosis not present

## 2018-08-25 DIAGNOSIS — I82492 Acute embolism and thrombosis of other specified deep vein of left lower extremity: Secondary | ICD-10-CM | POA: Diagnosis not present

## 2018-08-25 DIAGNOSIS — N186 End stage renal disease: Secondary | ICD-10-CM | POA: Diagnosis not present

## 2018-08-25 LAB — PROTIME-INR: INR: 1.8 — AB (ref <=1.1)

## 2018-08-27 DIAGNOSIS — N2581 Secondary hyperparathyroidism of renal origin: Secondary | ICD-10-CM | POA: Diagnosis not present

## 2018-08-27 DIAGNOSIS — N186 End stage renal disease: Secondary | ICD-10-CM | POA: Diagnosis not present

## 2018-08-27 DIAGNOSIS — D631 Anemia in chronic kidney disease: Secondary | ICD-10-CM | POA: Diagnosis not present

## 2018-08-27 DIAGNOSIS — E1122 Type 2 diabetes mellitus with diabetic chronic kidney disease: Secondary | ICD-10-CM | POA: Diagnosis not present

## 2018-08-29 ENCOUNTER — Telehealth: Payer: Self-pay

## 2018-08-29 NOTE — Telephone Encounter (Signed)
I need pt insurance card to start a PA. Will call pt to inform him of this

## 2018-08-29 NOTE — Telephone Encounter (Signed)
Patient needs PA for glucose blood (ACCU-CHEK SMARTVIEW) test strip- I called the pharmacy and they stated a form was faxed to the office-medicare has approved test strips but Medicaid will not approve unless PA is done

## 2018-08-29 NOTE — Telephone Encounter (Signed)
PA needed for test strips

## 2018-08-30 ENCOUNTER — Ambulatory Visit (INDEPENDENT_AMBULATORY_CARE_PROVIDER_SITE_OTHER): Payer: Medicaid Other | Admitting: General Practice

## 2018-08-30 DIAGNOSIS — Z7901 Long term (current) use of anticoagulants: Secondary | ICD-10-CM | POA: Diagnosis not present

## 2018-08-30 DIAGNOSIS — N2581 Secondary hyperparathyroidism of renal origin: Secondary | ICD-10-CM | POA: Diagnosis not present

## 2018-08-30 DIAGNOSIS — D631 Anemia in chronic kidney disease: Secondary | ICD-10-CM | POA: Diagnosis not present

## 2018-08-30 DIAGNOSIS — N186 End stage renal disease: Secondary | ICD-10-CM | POA: Diagnosis not present

## 2018-08-30 DIAGNOSIS — E1122 Type 2 diabetes mellitus with diabetic chronic kidney disease: Secondary | ICD-10-CM | POA: Diagnosis not present

## 2018-08-30 NOTE — Patient Instructions (Signed)
Pre visit review using our clinic review tool, if applicable. No additional management support is needed unless otherwise documented below in the visit note.  Received faxed results on 11/19. Take extra 1/2 tablet today and then continue to take 1 tablet daily except 1/2 tablet on Wednesdays and Saturdays. Re-check in 1 week. Spoke with patient and he verbalized understanding.  Dialysis patient.

## 2018-08-30 NOTE — Progress Notes (Signed)
Medical treatment/procedure(s) were performed by non-physician practitioner and as supervising physician I was immediately available for consultation/collaboration. I agree with above.  A , MD  

## 2018-09-01 DIAGNOSIS — E1122 Type 2 diabetes mellitus with diabetic chronic kidney disease: Secondary | ICD-10-CM | POA: Diagnosis not present

## 2018-09-01 DIAGNOSIS — N186 End stage renal disease: Secondary | ICD-10-CM | POA: Diagnosis not present

## 2018-09-01 DIAGNOSIS — N2581 Secondary hyperparathyroidism of renal origin: Secondary | ICD-10-CM | POA: Diagnosis not present

## 2018-09-01 DIAGNOSIS — I82492 Acute embolism and thrombosis of other specified deep vein of left lower extremity: Secondary | ICD-10-CM | POA: Diagnosis not present

## 2018-09-01 DIAGNOSIS — D631 Anemia in chronic kidney disease: Secondary | ICD-10-CM | POA: Diagnosis not present

## 2018-09-01 LAB — PROTIME-INR: INR: 1.8 — AB (ref <=1.1)

## 2018-09-02 ENCOUNTER — Telehealth: Payer: Self-pay | Admitting: Endocrinology

## 2018-09-02 NOTE — Telephone Encounter (Signed)
Patient dropped by the office today to have his insurance scanned. He called to check if the PA has been started

## 2018-09-03 DIAGNOSIS — N2581 Secondary hyperparathyroidism of renal origin: Secondary | ICD-10-CM | POA: Diagnosis not present

## 2018-09-03 DIAGNOSIS — D631 Anemia in chronic kidney disease: Secondary | ICD-10-CM | POA: Diagnosis not present

## 2018-09-03 DIAGNOSIS — E1122 Type 2 diabetes mellitus with diabetic chronic kidney disease: Secondary | ICD-10-CM | POA: Diagnosis not present

## 2018-09-03 DIAGNOSIS — N186 End stage renal disease: Secondary | ICD-10-CM | POA: Diagnosis not present

## 2018-09-05 DIAGNOSIS — N2581 Secondary hyperparathyroidism of renal origin: Secondary | ICD-10-CM | POA: Diagnosis not present

## 2018-09-05 DIAGNOSIS — D631 Anemia in chronic kidney disease: Secondary | ICD-10-CM | POA: Diagnosis not present

## 2018-09-05 DIAGNOSIS — N186 End stage renal disease: Secondary | ICD-10-CM | POA: Diagnosis not present

## 2018-09-05 DIAGNOSIS — E1122 Type 2 diabetes mellitus with diabetic chronic kidney disease: Secondary | ICD-10-CM | POA: Diagnosis not present

## 2018-09-05 NOTE — Telephone Encounter (Signed)
Have you seen PA for this patient

## 2018-09-05 NOTE — Telephone Encounter (Signed)
PA has been faxed to [facility]

## 2018-09-06 ENCOUNTER — Ambulatory Visit (INDEPENDENT_AMBULATORY_CARE_PROVIDER_SITE_OTHER): Payer: Medicare Other | Admitting: General Practice

## 2018-09-06 DIAGNOSIS — Z7901 Long term (current) use of anticoagulants: Secondary | ICD-10-CM | POA: Diagnosis not present

## 2018-09-06 NOTE — Progress Notes (Signed)
Medical treatment/procedure(s) were performed by non-physician practitioner and as supervising physician I was immediately available for consultation/collaboration. I agree with above. Parag Dorton A Dominika Losey, MD  

## 2018-09-06 NOTE — Patient Instructions (Signed)
Pre visit review using our clinic review tool, if applicable. No additional management support is needed unless otherwise documented below in the visit note.  Received faxed results on 11/26. Take extra 1/2 tablet today and then change dosage and take 1 tablet daily except 1/2 tablet on Wednesdays. Re-check in 1 week. Spoke with patient and he verbalized understanding.  Dialysis patient.

## 2018-09-07 ENCOUNTER — Other Ambulatory Visit: Payer: Self-pay

## 2018-09-07 DIAGNOSIS — N186 End stage renal disease: Secondary | ICD-10-CM | POA: Diagnosis not present

## 2018-09-07 DIAGNOSIS — E1122 Type 2 diabetes mellitus with diabetic chronic kidney disease: Secondary | ICD-10-CM | POA: Diagnosis not present

## 2018-09-07 DIAGNOSIS — N2581 Secondary hyperparathyroidism of renal origin: Secondary | ICD-10-CM | POA: Diagnosis not present

## 2018-09-07 DIAGNOSIS — I82492 Acute embolism and thrombosis of other specified deep vein of left lower extremity: Secondary | ICD-10-CM | POA: Diagnosis not present

## 2018-09-07 DIAGNOSIS — D631 Anemia in chronic kidney disease: Secondary | ICD-10-CM | POA: Diagnosis not present

## 2018-09-07 NOTE — Telephone Encounter (Signed)
Pt brought in insurance card and PA has been started for test strips

## 2018-09-10 DIAGNOSIS — N2581 Secondary hyperparathyroidism of renal origin: Secondary | ICD-10-CM | POA: Diagnosis not present

## 2018-09-10 DIAGNOSIS — D631 Anemia in chronic kidney disease: Secondary | ICD-10-CM | POA: Diagnosis not present

## 2018-09-10 DIAGNOSIS — N186 End stage renal disease: Secondary | ICD-10-CM | POA: Diagnosis not present

## 2018-09-10 DIAGNOSIS — E1122 Type 2 diabetes mellitus with diabetic chronic kidney disease: Secondary | ICD-10-CM | POA: Diagnosis not present

## 2018-09-11 DIAGNOSIS — N186 End stage renal disease: Secondary | ICD-10-CM | POA: Diagnosis not present

## 2018-09-11 DIAGNOSIS — Z992 Dependence on renal dialysis: Secondary | ICD-10-CM | POA: Diagnosis not present

## 2018-09-11 DIAGNOSIS — E1122 Type 2 diabetes mellitus with diabetic chronic kidney disease: Secondary | ICD-10-CM | POA: Diagnosis not present

## 2018-09-12 ENCOUNTER — Encounter: Payer: Medicaid Other | Admitting: Endocrinology

## 2018-09-12 ENCOUNTER — Other Ambulatory Visit (INDEPENDENT_AMBULATORY_CARE_PROVIDER_SITE_OTHER): Payer: Medicare Other

## 2018-09-12 DIAGNOSIS — E1165 Type 2 diabetes mellitus with hyperglycemia: Secondary | ICD-10-CM

## 2018-09-12 DIAGNOSIS — Z794 Long term (current) use of insulin: Secondary | ICD-10-CM | POA: Diagnosis not present

## 2018-09-12 LAB — LIPID PANEL
Cholesterol: 177 mg/dL (ref 0–200)
HDL: 31.9 mg/dL — ABNORMAL LOW (ref >39.00)
LDL Cholesterol: 109 mg/dL — ABNORMAL HIGH (ref 0–99)
NonHDL: 144.88
Total CHOL/HDL Ratio: 6
Triglycerides: 180 mg/dL — ABNORMAL HIGH (ref 0.0–149.0)
VLDL: 36 mg/dL (ref 0.0–40.0)

## 2018-09-12 LAB — GLUCOSE, RANDOM: Glucose, Bld: 190 mg/dL — ABNORMAL HIGH (ref 70–99)

## 2018-09-13 ENCOUNTER — Ambulatory Visit (INDEPENDENT_AMBULATORY_CARE_PROVIDER_SITE_OTHER): Payer: Medicare Other | Admitting: General Practice

## 2018-09-13 DIAGNOSIS — N2581 Secondary hyperparathyroidism of renal origin: Secondary | ICD-10-CM | POA: Diagnosis not present

## 2018-09-13 DIAGNOSIS — D509 Iron deficiency anemia, unspecified: Secondary | ICD-10-CM | POA: Diagnosis not present

## 2018-09-13 DIAGNOSIS — Z7901 Long term (current) use of anticoagulants: Secondary | ICD-10-CM | POA: Diagnosis not present

## 2018-09-13 DIAGNOSIS — N186 End stage renal disease: Secondary | ICD-10-CM | POA: Diagnosis not present

## 2018-09-13 DIAGNOSIS — E1122 Type 2 diabetes mellitus with diabetic chronic kidney disease: Secondary | ICD-10-CM | POA: Diagnosis not present

## 2018-09-13 LAB — PROTIME-INR: INR: 2 — AB (ref <=1.1)

## 2018-09-13 LAB — FRUCTOSAMINE: Fructosamine: 352 umol/L — ABNORMAL HIGH (ref 0–285)

## 2018-09-13 NOTE — Progress Notes (Signed)
Medical treatment/procedure(s) were performed by non-physician practitioner and as supervising physician I was immediately available for consultation/collaboration. I agree with above. Buel Molder A Murdis Flitton, MD  

## 2018-09-13 NOTE — Patient Instructions (Signed)
Pre visit review using our clinic review tool, if applicable. No additional management support is needed unless otherwise documented below in the visit note.  Continue to take 1 tablet daily except 1/2 tablet on Wednesdays. Re-check in 1 week. Spoke with patient and he verbalized understanding.  Dialysis patient.

## 2018-09-13 NOTE — Progress Notes (Signed)
This encounter was created in error - please disregard.

## 2018-09-14 ENCOUNTER — Ambulatory Visit (INDEPENDENT_AMBULATORY_CARE_PROVIDER_SITE_OTHER): Payer: Medicare Other | Admitting: Endocrinology

## 2018-09-14 ENCOUNTER — Other Ambulatory Visit: Payer: Self-pay

## 2018-09-14 ENCOUNTER — Encounter: Payer: Self-pay | Admitting: Endocrinology

## 2018-09-14 VITALS — BP 128/70 | HR 84 | Ht 70.0 in | Wt 319.4 lb

## 2018-09-14 DIAGNOSIS — Z794 Long term (current) use of insulin: Secondary | ICD-10-CM | POA: Diagnosis not present

## 2018-09-14 DIAGNOSIS — E1165 Type 2 diabetes mellitus with hyperglycemia: Secondary | ICD-10-CM

## 2018-09-14 DIAGNOSIS — E782 Mixed hyperlipidemia: Secondary | ICD-10-CM | POA: Diagnosis not present

## 2018-09-14 LAB — GLUCOSE, POCT (MANUAL RESULT ENTRY): POC Glucose: 144 mg/dl — AB (ref 70–99)

## 2018-09-14 MED ORDER — GLUCOSE BLOOD VI STRP: 1.0000 | ORAL_STRIP | 1 refills | Status: DC | PRN

## 2018-09-14 MED ORDER — GLUCOSE BLOOD VI STRP: 1.0000 | ORAL_STRIP | 3 refills | Status: DC | PRN

## 2018-09-14 NOTE — Progress Notes (Signed)
Patient ID: [name] [name], male   DOB: [birth year], 52 y.o.   MRN: [id number]           Reason for Appointment: Follow-up for Type 2 Diabetes  History of Present Illness:          Date of diagnosis of type 2 diabetes mellitus : 1990       Background history:  He has been taking insulin since about 1999, previously was on unknown oral agents He had good control with insulin initially but A1c was much higher in 2008 and subsequently has had required larger doses of insulin He has been on Lantus for the last few years along with Novolog,followed by an endocrinologist since 2008 His A1c was 6.3 in 04/2014   Prior to his consultation he was on a regimen of Novolog , 50 units 5 times a day with each meal and snack along with 90 Units 3 times a day of Lantus   Recent history:   INSULIN regimen is :TOUJEO 80 units twice daily Humulin R U-500 insulin with KwikPen 15-30 before bfst, 35 before lunch and 25 before dinner, usually 30 min before  Sliding scale use for high sugars: Sugar 150-199 take 2 units, 200-250= 4 units and over 250 take 6 units   Most recently A1C at 7.4 compared to 7.9   He has not been seen in follow-up since 2/19  Current blood sugar patterns and problems identified:   He again did not bring his glucose monitor for download  He says that he is buying his own test strips as they are not covered by insurance  However he did not call to let us know he needed a new meter that is better covered  As before he continues to adjust his insulin empirically on his own but not clear if he is doing this appropriately  His fasting blood sugars are reportedly fairly close to 100  However he thinks his blood sugars are high at lunch and dinner but not clear how consistent  Again despite reminders he does not check his readings after evening meal  Today had a light meal only and took 15 units of regular insulin otherwise takes about 30 units  He will not take any insulin or  eat in the morning when going for dialysis  Fasting readings are not low and he has continued the same dose of Toujeo  Only rarely will have a low blood sugar if he is overestimating his insulin dose He says he is trying to cut back on portions but still not able to lose weight   Compliance with the medical regimen: Inconsistent   Glucose monitoring:  done 0-2 times a day         Glucometer: Accu-Chek       Blood Glucose readings by recall:   PRE-MEAL Fasting Lunch Dinner Bedtime Overall  Glucose range: 98 200 180 ?   Mean/median:        POST-MEAL PC Breakfast PC Lunch PC Dinner  Glucose range:   ?  Mean/median:         Self-care: The diet that the patient has been following is: tries to limit high-fat meals, usually eating  2-3 meals a day.     Typical meal intake: Breakfast is grits with pecans, olive oil. Dinner 5-6 pm                Dietician visit, most recent:2013  Exercise:  unable to do any, In wheelchair  Weight history:  Wt Readings from Last 3 Encounters:  09/14/18 (!) 319 lb 6.4 oz (144.9 kg)  07/04/18 (!) 319 lb (144.7 kg)  04/12/18 (!) 304 lb 3.8 oz (138 kg)    Glycemic control:   Lab Results  Component Value Date   HGBA1C 7.4 (A) 07/05/2018   HGBA1C 7.9 (H) 10/20/2017   HGBA1C 7.2 (H) 05/19/2017   Lab Results  Component Value Date   MICROALBUR 232.9 (H) 06/19/2015   LDLCALC 109 (H) 09/12/2018   CREATININE 10.47 (H) 04/12/2018    Lab Results  Component Value Date   FRUCTOSAMINE 352 (H) 09/12/2018   FRUCTOSAMINE 408 (H) 07/23/2017   FRUCTOSAMINE 408 (H) 05/19/2017      Allergies as of 09/14/2018      Reactions   Pork-derived Products Anaphylaxis, Other (See Comments)   Fever also    Ace Inhibitors Cough   Hydrocodone Other (See Comments)   Hallucinations      Medication List        Accurate as of 09/14/18  9:37 AM. Always use your most recent med list.          allopurinol 100 MG tablet Commonly known as:   ZYLOPRIM Take 1 tablet (100 mg total) by mouth 3 (three) times a week. After dialysis   calcium acetate 667 MG capsule Commonly known as:  PHOSLO Take 3 capsules (2,001 mg total) by mouth 3 (three) times daily with meals.   clonazePAM 1 MG tablet Commonly known as:  KLONOPIN   glucose blood test strip Use to test 4 times daily DX code E11.29   hydrocortisone 25 MG suppository Commonly known as:  ANUSOL-HC Place 1 suppository (25 mg total) rectally 2 (two) times daily.   hydrocortisone cream 1 % Apply to affected area 2 times daily   hydrocortisone-pramoxine rectal foam Commonly known as:  PROCTOFOAM-HC Place 1 applicator rectally 3 (three) times daily. Prn hemorrhoids   Insulin Glargine 300 UNIT/ML Sopn Inject 70-80 Units into the skin 2 (two) times daily. Inject 80 units in the morning and inject 70 units in the evening.   Insulin Pen Needle 32G X 8 MM Misc Use as directed   DROPLET PEN NEEDLES 32G X 4 MM Misc Generic drug:  Insulin Pen Needle USE  TO INJECT FIVE TIMES DAILY AS DIRECTED   insulin regular human CONCENTRATED 500 UNIT/ML kwikpen Commonly known as:  HUMULIN R INJECT SUBCUTANEOUSLY 15 UNITS AT BREAKFAST, 25 UNITS AT LUNCH AND 25-30 UNITS BEFORE SUPPER   midodrine 10 MG tablet Commonly known as:  PROAMATINE Take 10 mg by mouth as needed (low blood pressure).   omeprazole 20 MG capsule Commonly known as:  PRILOSEC TAKE 1 CAPSULE EVERY DAY   ONE TOUCH LANCETS Misc Use to test sugar 4 times daily   RENVELA 800 MG tablet Generic drug:  sevelamer carbonate Take 2,400 mg by mouth 3 (three) times daily with meals.   rosuvastatin 20 MG tablet Commonly known as:  CRESTOR TAKE 1 TABLET AT BEDTIME   Suvorexant 10 MG Tabs Take 10 mg by mouth at bedtime.   triamcinolone cream 0.1 % Commonly known as:  KENALOG Apply 1 application topically 2 (two) times daily.   VELTASSA 8.4 g packet Generic drug:  patiromer Take 8.4 g by mouth daily.   warfarin 5 MG  tablet Commonly known as:  COUMADIN Take as directed by the anticoagulation clinic. If you are unsure how to take this medication,  talk to your nurse or doctor. Original instructions:  Take 1 (5 mg) tablet every day except for Wednesday & Saturday when you take 7.1m (1.5 tablets) OR As directed.       Allergies:  Allergies  Allergen Reactions  . Pork-Derived Products Anaphylaxis and Other (See Comments)    Fever also   . Ace Inhibitors Cough  . Hydrocodone Other (See Comments)    Hallucinations    Past Medical History:  Diagnosis Date  . Allergic rhinitis   . Anemia   . Aortic insufficiency    a. Echo 7/16:  Mod LVH, EF 50-55%, mild to mod AI, severe LAE, PASP 57 mmHg (LV ID end diastolic 503.4mm);  b. Echo 2/17:  Mod LVH, EF 50-55%, mod AI, MAC, mild MR, mod LAE, mild to mod TR, PASP 63 mmHg  . Bilateral carpal tunnel syndrome   . CHF (congestive heart failure) ([facility]   . Claustrophobia   . COPD (chronic obstructive pulmonary disease) ([facility] [place]   . Diabetes mellitus    Type II  . Diabetic peripheral neuropathy ([facility]   . Diabetic retinopathy ([facility]   . Discitis of lumbar region    resolved  . DVT (deep venous thrombosis) ([place] 10/07/2015   LLE  . Endocarditis 11/28/14  . ESRD (end stage renal disease) on dialysis ([facility]    s/p renal transplant in 2009, failed in 2016  . Gait disorder   . Gastroparesis   . GERD (gastroesophageal reflux disease)   . Gout   . Hearing difficulty   . Hyperlipidemia   . Hypertension   . Incomplete bladder emptying   . Leg pain 02/05/11   with walking  . Morbid obesity ([facility]   . Obstructive sleep apnea    not using CPAP- lost 100lbs  . Osteomyelitis ([facility] II   . Posttraumatic stress disorder   . Rotator cuff disorder   . Secondary hyperparathyroidism ([facility]   . Sepsis ([facility] [place]    Postive blood cultures -   . Urethral stricture   . Vitamin D deficiency     Past Surgical History:  Procedure Laterality Date  . ARTERIOVENOUS GRAFT PLACEMENT  Bilateral "several"  . AV FISTULA PLACEMENT Bilateral 2015  . BASCILIC VEIN TRANSPOSITION Right 09/27/2015  . [place]   Procedure: BASILIC VEIN TRANSPOSITION right;  Surgeon: JMal Misty MD;  [facility] [facility]  Service: Vascular;  Laterality: Right;  . CATARACT EXTRACTION W/ INTRAOCULAR LENS  IMPLANT, BILATERAL Bilateral   . CYSTOSCOPY W/ INTERNAL URETHROTOMY  09/2014  . KIDNEY TRANSPLANT  2009    Family History  Problem Relation Age of Onset  . Hypertension Mother   . Diabetes Father     Social History:  reports that he has never smoked. He has never used smokeless tobacco. He reports that he does not drink alcohol or use drugs.    Review of Systems    Review of Systems     Lipid history: He is on 20 mg Crestor With the following results    Lab Results  Component Value Date   CHOL 177 09/12/2018   HDL 31.90 (L) 09/12/2018   LDLCALC 109 (H) 09/12/2018   TRIG 180.0 (H) 09/12/2018   CHOLHDL 6 09/12/2018            Hypertension: His blood pressure is managed by nephrologist and PCP   RENAL: He has had a transplant in 2009 and  he is on dialysis  Neurological: Has has had  decreased sensation  in his feet, also complaining of tingling and burning Previously followed by neurologist He did not follow-up with the neurologist as recommended  He has had marked decrease in his balance and also some weakness in his legs, using a power scooter  Last foot exam was in 02/2017: Absent monofilament sensation in his feet and toes and extending up to the middle of the lower leg on the left and up to the ankle on the right  LABS:  Office Visit on 09/14/2018  Component Date Value Ref Range Status  . POC Glucose 09/14/2018 144* 70 - 99 mg/dl Final  Anti-coag visit on 09/13/2018  Component Date Value Ref Range Status  . INR 08/09/2018 2.0* 0.9 - 1.1 Final   Spectra Labs  . INR 09/13/2018 2.0* 0.9 - 1.1 Final   Spectra  Lab on 09/12/2018    Component Date Value Ref Range Status  . Cholesterol 09/12/2018 177  0 - 200 mg/dL Final   ATP III Classification       Desirable:  < 200 mg/dL               Borderline High:  200 - 239 mg/dL          High:  > = 240 mg/dL  . Triglycerides 09/12/2018 180.0* 0.0 - 149.0 mg/dL Final   Normal:  <150 mg/dLBorderline High:  150 - 199 mg/dL  . HDL 09/12/2018 31.90* >39.00 mg/dL Final  . VLDL 09/12/2018 36.0  0.0 - 40.0 mg/dL Final  . LDL Cholesterol 09/12/2018 109* 0 - 99 mg/dL Final  . Total CHOL/HDL Ratio 09/12/2018 6   Final                  Men          Women1/2 Average Risk     3.4          3.3Average Risk          5.0          4.42X Average Risk          9.6          7.13X Average Risk          15.0          11.0                      . NonHDL 09/12/2018 144.88   Final   NOTE:  Non-HDL goal should be 30 mg/dL higher than patient's LDL goal (i.e. LDL goal of < 70 mg/dL, would have non-HDL goal of < 100 mg/dL)  . Glucose, Bld 09/12/2018 190* 70 - 99 mg/dL Final  . Fructosamine 09/12/2018 352* 0 - 285 umol/L Final   Comment: Published reference interval for apparently healthy subjects between age 61 and 60 is 64 - 285 umol/L and in a poorly controlled diabetic population is 228 - 563 umol/L with a mean of 396 umol/L.     Physical Examination:  BP 128/70 (BP Location: Left Arm, Patient Position: Sitting, Cuff Size: Normal)   Pulse 84   Ht _0  (1.778 m)   Wt (!) 319 lb 6.4 oz (144.9 kg)   SpO2 94%   BMI 45.83 kg/m      ASSESSMENT:  Diabetes type 2, long-standing with obesity See history of present illness for detailed discussion of his current management, blood sugar patterns and problems identified.  His A1c was previously 7.4 and now fructosamine is 352 which is relatively better  As before difficult to know what his blood sugar patterns are without his glucose meter and most likely his checking blood sugars only infrequently  Lipids: His LDL is still over 100 and will  forward results to PCP for review, he may not be taking his Crestor regularly   PLAN:   He was given a new Accu-Chek guide meter which is apparently covered by Medicaid  Discussed that he will need to check readings consistently 4 times a day including after supper to help adjust his insulin  Most likely needs 40 units of regular insulin at lunchtime instead of 35 since blood sugars are higher in the evening  Discussed blood sugar targets   There are no Patient Instructions on file for this visit.       Elayne Snare 09/14/2018, 9:37 AM   Note: This office note was prepared with Dragon voice recognition system technology. Any transcriptional errors that result from this process are unintentional.

## 2018-09-14 NOTE — Patient Instructions (Addendum)
Check blood sugars on waking up 3-4 days a week  Also check blood sugars about 2 hours after meals and do this after different meals by rotation  Recommended blood sugar levels on waking up are 90-130 and about 2-3 hours after meal is 130-180  Please bring your blood sugar monitor to each visit, thank you  Take 40 units at lunch for full meal   take Humulin R 30 minutes before eating

## 2018-09-15 DIAGNOSIS — D509 Iron deficiency anemia, unspecified: Secondary | ICD-10-CM | POA: Diagnosis not present

## 2018-09-15 DIAGNOSIS — N2581 Secondary hyperparathyroidism of renal origin: Secondary | ICD-10-CM | POA: Diagnosis not present

## 2018-09-15 DIAGNOSIS — M109 Gout, unspecified: Secondary | ICD-10-CM | POA: Diagnosis not present

## 2018-09-15 DIAGNOSIS — I82492 Acute embolism and thrombosis of other specified deep vein of left lower extremity: Secondary | ICD-10-CM | POA: Diagnosis not present

## 2018-09-15 DIAGNOSIS — N186 End stage renal disease: Secondary | ICD-10-CM | POA: Diagnosis not present

## 2018-09-15 DIAGNOSIS — E1122 Type 2 diabetes mellitus with diabetic chronic kidney disease: Secondary | ICD-10-CM | POA: Diagnosis not present

## 2018-09-16 ENCOUNTER — Ambulatory Visit (INDEPENDENT_AMBULATORY_CARE_PROVIDER_SITE_OTHER): Payer: Medicare Other | Admitting: General Practice

## 2018-09-16 DIAGNOSIS — R635 Abnormal weight gain: Secondary | ICD-10-CM | POA: Diagnosis not present

## 2018-09-16 DIAGNOSIS — Z7901 Long term (current) use of anticoagulants: Secondary | ICD-10-CM

## 2018-09-16 LAB — POCT INR: INR: 1.4 — AB (ref 2.0–3.0)

## 2018-09-16 NOTE — Progress Notes (Signed)
Medical treatment/procedure(s) were performed by non-physician practitioner and as supervising physician I was immediately available for consultation/collaboration. I agree with above. Daphna Lafuente A Avamarie Crossley, MD  

## 2018-09-16 NOTE — Patient Instructions (Signed)
Pre visit review using our clinic review tool, if applicable. No additional management support is needed unless otherwise documented below in the visit note.  Please take extra 1/2 tablet for the next 3 days.  On Sunday change dosage and take 1 tablet daily except 1/2 tablet on Wednesdays only.   Re-check in 1 week. LMOVM.

## 2018-09-17 DIAGNOSIS — N2581 Secondary hyperparathyroidism of renal origin: Secondary | ICD-10-CM | POA: Diagnosis not present

## 2018-09-17 DIAGNOSIS — N186 End stage renal disease: Secondary | ICD-10-CM | POA: Diagnosis not present

## 2018-09-17 DIAGNOSIS — E1122 Type 2 diabetes mellitus with diabetic chronic kidney disease: Secondary | ICD-10-CM | POA: Diagnosis not present

## 2018-09-17 DIAGNOSIS — D509 Iron deficiency anemia, unspecified: Secondary | ICD-10-CM | POA: Diagnosis not present

## 2018-09-20 DIAGNOSIS — N2581 Secondary hyperparathyroidism of renal origin: Secondary | ICD-10-CM | POA: Diagnosis not present

## 2018-09-20 DIAGNOSIS — D509 Iron deficiency anemia, unspecified: Secondary | ICD-10-CM | POA: Diagnosis not present

## 2018-09-20 DIAGNOSIS — E1122 Type 2 diabetes mellitus with diabetic chronic kidney disease: Secondary | ICD-10-CM | POA: Diagnosis not present

## 2018-09-20 DIAGNOSIS — N186 End stage renal disease: Secondary | ICD-10-CM | POA: Diagnosis not present

## 2018-09-21 ENCOUNTER — Other Ambulatory Visit: Payer: Self-pay

## 2018-09-21 MED ORDER — TRIAMCINOLONE ACETONIDE 0.1 % EX CREA: 1.0000 "application " | TOPICAL_CREAM | Freq: Two times a day (BID) | CUTANEOUS | 2 refills | Status: DC

## 2018-09-22 DIAGNOSIS — D509 Iron deficiency anemia, unspecified: Secondary | ICD-10-CM | POA: Diagnosis not present

## 2018-09-22 DIAGNOSIS — E1122 Type 2 diabetes mellitus with diabetic chronic kidney disease: Secondary | ICD-10-CM | POA: Diagnosis not present

## 2018-09-22 DIAGNOSIS — I82492 Acute embolism and thrombosis of other specified deep vein of left lower extremity: Secondary | ICD-10-CM | POA: Diagnosis not present

## 2018-09-22 DIAGNOSIS — N2581 Secondary hyperparathyroidism of renal origin: Secondary | ICD-10-CM | POA: Diagnosis not present

## 2018-09-22 DIAGNOSIS — N186 End stage renal disease: Secondary | ICD-10-CM | POA: Diagnosis not present

## 2018-09-24 DIAGNOSIS — D509 Iron deficiency anemia, unspecified: Secondary | ICD-10-CM | POA: Diagnosis not present

## 2018-09-24 DIAGNOSIS — N186 End stage renal disease: Secondary | ICD-10-CM | POA: Diagnosis not present

## 2018-09-24 DIAGNOSIS — E1122 Type 2 diabetes mellitus with diabetic chronic kidney disease: Secondary | ICD-10-CM | POA: Diagnosis not present

## 2018-09-24 DIAGNOSIS — N2581 Secondary hyperparathyroidism of renal origin: Secondary | ICD-10-CM | POA: Diagnosis not present

## 2018-09-27 ENCOUNTER — Ambulatory Visit (INDEPENDENT_AMBULATORY_CARE_PROVIDER_SITE_OTHER): Payer: Medicare Other | Admitting: General Practice

## 2018-09-27 ENCOUNTER — Other Ambulatory Visit: Payer: Self-pay

## 2018-09-27 ENCOUNTER — Emergency Department (HOSPITAL_COMMUNITY)
Admission: EM | Admit: 2018-09-27 | Discharge: 2018-09-27 | Disposition: A | Discharge status: Home / Self Care | Payer: Medicare Other | Attending: Emergency Medicine | Admitting: Emergency Medicine

## 2018-09-27 ENCOUNTER — Encounter (HOSPITAL_COMMUNITY): Payer: Self-pay

## 2018-09-27 DIAGNOSIS — R000 Tachycardia, unspecified: Secondary | ICD-10-CM | POA: Diagnosis not present

## 2018-09-27 DIAGNOSIS — I2510 Atherosclerotic heart disease of native coronary artery without angina pectoris: Secondary | ICD-10-CM | POA: Diagnosis not present

## 2018-09-27 DIAGNOSIS — Z794 Long term (current) use of insulin: Secondary | ICD-10-CM | POA: Insufficient documentation

## 2018-09-27 DIAGNOSIS — I509 Heart failure, unspecified: Secondary | ICD-10-CM | POA: Insufficient documentation

## 2018-09-27 DIAGNOSIS — F419 Anxiety disorder, unspecified: Secondary | ICD-10-CM | POA: Diagnosis not present

## 2018-09-27 DIAGNOSIS — Z7901 Long term (current) use of anticoagulants: Secondary | ICD-10-CM | POA: Diagnosis not present

## 2018-09-27 DIAGNOSIS — Z940 Kidney transplant status: Secondary | ICD-10-CM | POA: Insufficient documentation

## 2018-09-27 DIAGNOSIS — R42 Dizziness and giddiness: Secondary | ICD-10-CM | POA: Diagnosis not present

## 2018-09-27 DIAGNOSIS — Z992 Dependence on renal dialysis: Secondary | ICD-10-CM | POA: Diagnosis not present

## 2018-09-27 DIAGNOSIS — I132 Hypertensive heart and chronic kidney disease with heart failure and with stage 5 chronic kidney disease, or end stage renal disease: Secondary | ICD-10-CM | POA: Diagnosis not present

## 2018-09-27 DIAGNOSIS — N186 End stage renal disease: Secondary | ICD-10-CM | POA: Insufficient documentation

## 2018-09-27 DIAGNOSIS — D509 Iron deficiency anemia, unspecified: Secondary | ICD-10-CM | POA: Diagnosis not present

## 2018-09-27 DIAGNOSIS — N2581 Secondary hyperparathyroidism of renal origin: Secondary | ICD-10-CM | POA: Diagnosis not present

## 2018-09-27 DIAGNOSIS — J449 Chronic obstructive pulmonary disease, unspecified: Secondary | ICD-10-CM | POA: Insufficient documentation

## 2018-09-27 DIAGNOSIS — M5489 Other dorsalgia: Secondary | ICD-10-CM | POA: Diagnosis not present

## 2018-09-27 DIAGNOSIS — R197 Diarrhea, unspecified: Secondary | ICD-10-CM | POA: Diagnosis not present

## 2018-09-27 DIAGNOSIS — Z79899 Other long term (current) drug therapy: Secondary | ICD-10-CM | POA: Insufficient documentation

## 2018-09-27 DIAGNOSIS — E1122 Type 2 diabetes mellitus with diabetic chronic kidney disease: Secondary | ICD-10-CM | POA: Insufficient documentation

## 2018-09-27 DIAGNOSIS — R0902 Hypoxemia: Secondary | ICD-10-CM | POA: Diagnosis not present

## 2018-09-27 LAB — POCT INR: INR: 1.9 — AB (ref 2.0–3.0)

## 2018-09-27 LAB — CBC WITH DIFFERENTIAL/PLATELET
Abs Immature Granulocytes: 0.02 10*3/uL (ref 0.00–0.07)
Basophils Absolute: 0 10*3/uL (ref 0.0–0.1)
Basophils Relative: 0 %
Eosinophils Absolute: 0.1 10*3/uL (ref 0.0–0.5)
Eosinophils Relative: 1 %
HCT: 38.3 % — ABNORMAL LOW (ref 39.0–52.0)
Hemoglobin: 11.5 g/dL — ABNORMAL LOW (ref 13.0–17.0)
Immature Granulocytes: 0 %
Lymphocytes Relative: 10 %
Lymphs Abs: 0.7 10*3/uL (ref 0.7–4.0)
MCH: 28.5 pg (ref 26.0–34.0)
MCHC: 30 g/dL (ref 30.0–36.0)
MCV: 95 fL (ref 80.0–100.0)
Monocytes Absolute: 0.5 10*3/uL (ref 0.1–1.0)
Monocytes Relative: 7 %
Neutro Abs: 5.9 10*3/uL (ref 1.7–7.7)
Neutrophils Relative %: 82 %
Platelets: 264 10*3/uL (ref 150–400)
RBC: 4.03 MIL/uL — ABNORMAL LOW (ref 4.22–5.81)
RDW: 17.2 % — ABNORMAL HIGH (ref 11.5–15.5)
WBC: 7.2 10*3/uL (ref 4.0–10.5)
nRBC: 0 % (ref 0.0–0.2)

## 2018-09-27 LAB — PROTIME-INR
INR: 2.51
Prothrombin Time: 26.7 seconds — ABNORMAL HIGH (ref 11.4–15.2)

## 2018-09-27 LAB — BASIC METABOLIC PANEL
Anion gap: 15 (ref 5–15)
BUN: 30 mg/dL — ABNORMAL HIGH (ref 6–20)
CO2: 26 mmol/L (ref 22–32)
Calcium: 8.7 mg/dL — ABNORMAL LOW (ref 8.9–10.3)
Chloride: 98 mmol/L (ref 98–111)
Creatinine, Ser: 6.15 mg/dL — ABNORMAL HIGH (ref 0.61–1.24)
GFR calc Af Amer: 11 mL/min — ABNORMAL LOW (ref >60)
GFR calc non Af Amer: 9 mL/min — ABNORMAL LOW (ref >60)
Glucose, Bld: 247 mg/dL — ABNORMAL HIGH (ref 70–99)
Potassium: 4.9 mmol/L (ref 3.5–5.1)
Sodium: 139 mmol/L (ref 135–145)

## 2018-09-27 LAB — TSH: TSH: 2.518 u[IU]/mL (ref 0.350–4.500)

## 2018-09-27 MED ORDER — SODIUM CHLORIDE 0.9 % IV BOLUS
500.0000 mL | Freq: Once | INTRAVENOUS | Status: AC
  Administered 2018-09-27: 500 mL via INTRAVENOUS

## 2018-09-27 NOTE — ED Notes (Signed)
Patient verbalizes understanding of discharge instructions. Opportunity for questioning and answers were provided. Pt discharged from ED. 

## 2018-09-27 NOTE — Progress Notes (Addendum)
Pt requesting ride with [facility]. CSW spoke with [facility]. Per [employer], approval must come from [facility] transportation. Pt is established for transportation with [employer] to go to and from dialysis.   CSW spoke with [employer] representative. CSW was transferred to the [employer], [place] left voicemail for [employer]. The transportation worker, requesting phone call back.   Ms. Gloriann Loan at [facility] Transportation to call [employer] to arrange transportation for pt home.  Wendelyn Breslow, [facility]  [phone]

## 2018-09-27 NOTE — Discharge Instructions (Signed)
Follow-up at the [facility] clinic Friday, December 20 at 11:30 AM. Return to the ED for chest pain, palpitations, shortness of breath, passing out, or any other major concerns.

## 2018-09-27 NOTE — ED Notes (Signed)
Pt requesting "[employer]"  Called them and they said same would need to be cleared through "[facility]" before they could pick up due to insurance may no cover same. Recommended social work get involved.

## 2018-09-27 NOTE — Patient Instructions (Signed)
Pre visit review using our clinic review tool, if applicable. No additional management support is needed unless otherwise documented below in the visit note.  Please take extra 1/2 tablet today and then change dosage and take 1 tablet daily.  Re-check in 1 week. Spoke with patient and he verbalized understanding.  Dialysis patient.

## 2018-09-27 NOTE — Progress Notes (Signed)
Medical treatment/procedure(s) were performed by non-physician practitioner and as supervising physician I was immediately available for consultation/collaboration. I agree with above. Elizabeth A Crawford, MD  

## 2018-09-27 NOTE — ED Provider Notes (Signed)
[facility] EMERGENCY DEPARTMENT Provider Note   CSN: [id number] Arrival date & time: 09/27/18  1140     History   Chief Complaint Chief Complaint  Patient presents with  . Tachycardia    HPI [name] [name] is a 52 y.o. male.  HPI   [name] [name] is a 52 y.o. male, with a history of CHF, COPD, DM, DVT (anticoagulated on Coumadin), ESRD on dialysis, morbid obesity, and HTN, presenting to the ED with tachycardia. Sent from dialysis center for tachycardia and hypotension. Patient states he arrived at dialysis with a pulse of 503 and systolic BP of 888. Still tachy after treatment. States he is typically hypotensive after treatment.   Complains of diarrhea for the last three days, three loose stools a day. No recent antibiotics. Has been eating and drinking normally. Last food yesterday evening.   States, "I don't have any complaints. They just sent me here. I just need to go home, relax, and eat something and I will be fine."  Denies fever/chills, N/V, dizziness, CP, SOB, peripheral edema, orthopnea, cough, palpitations, or any other complaints.    T, Th, Sa schedule. Last dialysis Dec 14.  Nephrologist: Jamal Maes. Cardiologist: Dr. Angelena Form INR was 1.9 this morning.   Past Medical History:  Diagnosis Date  . Allergic rhinitis   . Anemia   . Aortic insufficiency    a. Echo 7/16:  Mod LVH, EF 50-55%, mild to mod AI, severe LAE, PASP 57 mmHg (LV ID end diastolic 28.0 mm);  b. Echo 2/17:  Mod LVH, EF 50-55%, mod AI, MAC, mild MR, mod LAE, mild to mod TR, PASP 63 mmHg  . Bilateral carpal tunnel syndrome   . CHF (congestive heart failure) ([place])   . Claustrophobia   . COPD (chronic obstructive pulmonary disease) ([place])   . Diabetes mellitus    Type II  . Diabetic peripheral neuropathy ([place])   . Diabetic retinopathy ([place])   . Discitis of lumbar region    resolved  . DVT (deep venous thrombosis) ([place]) 10/07/2015   LLE  . Endocarditis 11/28/14  . ESRD  (end stage renal disease) on dialysis [facility])    s/p renal transplant in 2009, failed in 2016  . Gait disorder   . Gastroparesis   . GERD (gastroesophageal reflux disease)   . Gout   . Hearing difficulty   . Hyperlipidemia   . Hypertension   . Incomplete bladder emptying   . Leg pain 02/05/11   with walking  . Morbid obesity ([place])   . Obstructive sleep apnea    not using CPAP- lost 100lbs  . Osteomyelitis ([place])   . Posttraumatic stress disorder   . Rotator cuff disorder   . Secondary hyperparathyroidism ([place])   . Sepsis ([place])    Postive blood cultures -   . Urethral stricture   . Vitamin D deficiency     Patient Active Problem List   Diagnosis Date Noted  . Leg lesion 06/16/2018  . Hyperkalemia 04/11/2018  . Hemorrhoid 02/28/2018  . ESRD on dialysis ([place]) 11/15/2015  . CHF (congestive heart failure) ([place]) 10/07/2015  . Prolonged Q-T interval on ECG 10/07/2015  . Chronic deep vein thrombosis (DVT) of left lower extremity ([place]) 10/07/2015  . Aortic insufficiency 04/12/2015  . Back pain at L4-L5 level   . Anemia due to chronic kidney disease   . Cervical radiculopathy   . Diabetic peripheral neuropathy ([place]) 10/16/2014  . Renal transplant, status post 04/05/2014  . OSA (obstructive sleep  apnea) 04/05/2014  . Severe obesity (BMI >= 40) ([place]) 04/05/2014  . Benign fibroma of prostate 02/05/2014  . Diabetic macular edema ([place]) 01/15/2012  . Diabetes mellitus, type 2 ([place]) 01/15/2012  . IMMUNOCOMPROMISED 01/30/2010  . HYPERPARATHYROIDISM, SECONDARY 01/27/2010  . GOUT 01/27/2010  . Anxiety 01/27/2010  . INSOMNIA 01/27/2010  . HLD (hyperlipidemia) 05/25/2008  . Essential hypertension 05/25/2008    Past Surgical History:  Procedure Laterality Date  . ARTERIOVENOUS GRAFT PLACEMENT Bilateral "several"  . AV FISTULA PLACEMENT Bilateral 2015  . BASCILIC VEIN TRANSPOSITION Right 09/27/2015  . [place] Right 09/27/2015   Procedure: BASILIC VEIN TRANSPOSITION  right;  Surgeon: Mal Misty, MD;  Location: [facility];  Service: Vascular;  Laterality: Right;  . CATARACT EXTRACTION W/ INTRAOCULAR LENS  IMPLANT, BILATERAL Bilateral   . CYSTOSCOPY W/ INTERNAL URETHROTOMY  09/2014  . KIDNEY TRANSPLANT  2009        Home Medications    Prior to Admission medications   Medication Sig Start Date End Date Taking? Authorizing Provider  allopurinol (ZYLOPRIM) 100 MG tablet Take 1 tablet (100 mg total) by mouth 3 (three) times a week. After dialysis 06/15/18   Hoyt Koch, MD  calcium acetate (PHOSLO) 667 MG capsule Take 3 capsules (2,001 mg total) by mouth 3 (three) times daily with meals. 10/26/15   Ghimire, Henreitta Leber, MD  clonazePAM [facility] [place]) 1 MG tablet  12/24/16   [provider]  DROPLET PEN NEEDLES 32G X 4 MM MISC USE  TO INJECT FIVE TIMES DAILY AS DIRECTED 07/25/18   Elayne Snare, MD  glucose blood (ACCU-CHEK GUIDE) test strip 1 each by Other route as needed for other. Use as instructed to check blood sugar 4 times daily. DX:E11.65 09/14/18   Elayne Snare, MD  glucose blood (ACCU-CHEK SMARTVIEW) test strip Use to test 4 times daily DX code E11.29 06/27/18   Elayne Snare, MD  hydrocortisone (ANUSOL-HC) 25 MG suppository Place 1 suppository (25 mg total) rectally 2 (two) times daily. 03/16/18   Marrian Salvage, FNP  hydrocortisone cream 1 % Apply to affected area 2 times daily 11/29/16   Blanchie Dessert, MD  hydrocortisone-pramoxine [facility]) rectal foam Place 1 applicator rectally 3 (three) times daily. Prn hemorrhoids 03/16/18   Marrian Salvage, FNP  Insulin Glargine (TOUJEO SOLOSTAR) 300 UNIT/ML SOPN Inject 70-80 Units into the skin 2 (two) times daily. Inject 80 units in the morning and inject 70 units in the evening. 05/18/18   Elayne Snare, MD  Insulin Pen Needle 32G X 8 MM MISC Use as directed 10/26/15   Ghimire, Henreitta Leber, MD  insulin regular human CONCENTRATED (HUMULIN R U-500 KWIKPEN) 500 UNIT/ML kwikpen INJECT SUBCUTANEOUSLY  15 UNITS AT BREAKFAST, 25 UNITS AT LUNCH AND 25-30 UNITS BEFORE SUPPER 05/18/18   Elayne Snare, MD  midodrine (PROAMATINE) 10 MG tablet Take 10 mg by mouth as needed (low blood pressure).  01/08/17   [provider]  omeprazole (PRILOSEC) 20 MG capsule TAKE 1 CAPSULE EVERY DAY 05/23/18   Hoyt Koch, MD  ONE [facility] LANCETS MISC Use to test sugar 4 times daily 05/02/18   Elayne Snare, MD  RENVELA 800 MG tablet Take 2,400 mg by mouth 3 (three) times daily with meals.  02/02/17   [provider]  rosuvastatin (CRESTOR) 20 MG tablet TAKE 1 TABLET AT BEDTIME 07/25/18   Hoyt Koch, MD  Suvorexant (BELSOMRA) 10 MG TABS Take 10 mg by mouth at bedtime. 07/15/18   Hoyt Koch,  MD  triamcinolone cream (KENALOG) 0.1 % Apply 1 application topically 2 (two) times daily. 09/21/18   Hoyt Koch, MD  VELTASSA 8.4 g packet Take 8.4 g by mouth daily.  12/30/16   [provider]  warfarin (COUMADIN) 5 MG tablet Take 1 (5 mg) tablet every day except for Wednesday & Saturday when you take 7.52m (1.5 tablets) OR As directed. 07/05/17   CHoyt Koch MD    Family History Family History  Problem Relation Age of Onset  . Hypertension Mother   . Diabetes Father     Social History Social History   Tobacco Use  . Smoking status: Never Smoker  . Smokeless tobacco: Never Used  Substance Use Topics  . Alcohol use: No    Alcohol/week: 0.0 standard drinks  . Drug use: No     Allergies   Pork-derived products; Ace inhibitors; and Hydrocodone   Review of Systems Review of Systems  Constitutional: Negative for chills, diaphoresis and fever.  Respiratory: Negative for cough and shortness of breath.   Cardiovascular: Negative for chest pain, palpitations and leg swelling.  Gastrointestinal: Positive for diarrhea. Negative for abdominal pain, nausea and vomiting.  Musculoskeletal: Negative for back pain and neck pain.  Neurological: Negative for  dizziness, syncope, weakness, light-headedness and numbness.  All other systems reviewed and are negative.    Physical Exam Updated Vital Signs BP (!) 109/47   Pulse (!) 111   Temp 97.6 F (36.4 C) (Oral)   Resp 20   Ht _0  (1.778 m)   Wt (!) 142 kg   SpO2 91%   BMI 44.92 kg/m   Physical Exam Vitals signs and nursing note reviewed.  Constitutional:      General: He is not in acute distress.    Appearance: He is well-developed. He is not diaphoretic.  HENT:     Head: Normocephalic and atraumatic.  Eyes:     Conjunctiva/sclera: Conjunctivae normal.  Neck:     Musculoskeletal: Neck supple.  Cardiovascular:     Rate and Rhythm: Regular rhythm. Tachycardia present.     Pulses:          Posterior tibial pulses are 2+ on the right side and 2+ on the left side.     Heart sounds: Normal heart sounds.  Pulmonary:     Effort: Pulmonary effort is normal. No respiratory distress.     Breath sounds: Normal breath sounds.  Abdominal:     Palpations: Abdomen is soft.     Tenderness: There is no abdominal tenderness. There is no guarding.  Musculoskeletal:     Right lower leg: No edema.     Left lower leg: No edema.  Lymphadenopathy:     Cervical: No cervical adenopathy.  Skin:    General: Skin is warm and dry.  Neurological:     Mental Status: He is alert.  Psychiatric:        Behavior: Behavior normal.      ED Treatments / Results  Labs (all labs ordered are listed, but only abnormal results are displayed) Labs Reviewed  BASIC METABOLIC PANEL - Abnormal; Notable for the following components:      Result Value   Glucose, Bld 247 (*)    BUN 30 (*)    Creatinine, Ser 6.15 (*)    Calcium 8.7 (*)    GFR calc non Af Amer 9 (*)    GFR calc Af Amer 11 (*)    All other components within normal limits  CBC WITH DIFFERENTIAL/PLATELET - Abnormal; Notable for the following components:   RBC 4.03 (*)    Hemoglobin 11.5 (*)    HCT 38.3 (*)    RDW 17.2 (*)    All other  components within normal limits  PROTIME-INR - Abnormal; Notable for the following components:   Prothrombin Time 26.7 (*)    All other components within normal limits  TSH     EKG EKG Interpretation  Date/Time:  Tuesday September 27 2018 11:46:58 EST Ventricular Rate:  121 PR Interval:    QRS Duration: 145 QT Interval:  340 QTC Calculation: 483 R Axis:   -77 Text Interpretation:  Atrial flutter with 2:1 AV block IVCD, consider atypical RBBB Inferior infarct, old Anterior infarct, old Confirmed by Gerlene Fee [phone]) on 09/27/2018 12:07:42 PM   Radiology No results found.  Procedures Procedures (including critical care time)  Medications Ordered in ED Medications  sodium chloride 0.9 % bolus 500 mL (0 mLs Intravenous Stopped 09/27/18 1338)   This patients CHA2DS2-VASc Score and unadjusted Ischemic Stroke Rate (% per year) is equal to 4.8 % stroke rate/year from a score of 4  Above score calculated as 1 point each if present [CHF, HTN, DM, Vascular=MI/PAD/Aortic Plaque, Age if 65-74, or Male] Above score calculated as 2 points each if present [Age > 75, or Stroke/TIA/TE]   Initial Impression / Assessment and Plan / ED Course  I have reviewed the triage vital signs and the nursing notes.  Pertinent labs & imaging results that were available during my care of the patient were reviewed by me and considered in my medical decision making (see chart for details).  Clinical Course as of Sep 28 1523  Tue Sep 27, 2018  1340 Patient states he still "feels fine."    [SJ]  1441 Spoke with Trish. She will have cardiologist call me back.   [SJ]  Zion with Dr. Harrell Gave, cardiology.  If he is asymptomatic, can go home despite his tachycardia and hypotension. Set up a referral to the Afib clinic to be seen this week. Can't cardiovert due to recent subtherapeutic INR until he has a TEE.   [SJ]  1524 Patient noted to have been in this range before.  Hemoglobin(!): 11.5  [SJ]    Clinical Course User Index [SJ] Joy, Shawn C, PA-C    Patient presents with tachycardia and also noted to be hypotensive.  Completely asymptomatic and remained so through the ED course. Suspicion for possible aflutter or other atrial arrhythmia on EKG.  Patient does not seem to have a documented history of this arrhythmia previously.  His INR is therapeutic today.  Consulted with cardiology.  Patient will follow-up with the A. fib clinic.  I called the clinic and set up an appointment for the patient. Return precautions discussed. Patient voices understanding of these instructions, accepts the plan, and is comfortable with discharge.  Findings and plan of care discussed with Gerlene Fee, MD. Dr. Sedonia Small personally evaluated and examined this patient.  Vitals:   09/27/18 1330 09/27/18 1345 09/27/18 1400 09/27/18 1430  BP: (!) 86/42 (!) 104/37 (!) 96/41 (!) 130/49  Pulse: (!) 101 (!) 105 99 (!) 59  Resp: (!) 22 (!) 22 (!) 23 (!) 22  Temp:      TempSrc:      SpO2: 94% 99% 91% 93%  Weight:      Height:        Final Clinical Impressions(s) / ED Diagnoses   Final diagnoses:  Tachycardia  ED Discharge Orders         Ordered    Amb Referral to AFIB Clinic     09/27/18 Hamlin, Shawn C, PA-C 09/27/18 1524    Maudie Flakes, MD 09/27/18 [phone]

## 2018-09-27 NOTE — ED Triage Notes (Signed)
Pt arrived via [facility] from dialysis where pt did receive full treatment but was found to be tachycardic after and hypotensive. Pt denies pain.

## 2018-09-27 NOTE — ED Notes (Signed)
Pt given cranberry juice and [place] sandwich by ED staff.

## 2018-09-29 DIAGNOSIS — E1122 Type 2 diabetes mellitus with diabetic chronic kidney disease: Secondary | ICD-10-CM | POA: Diagnosis not present

## 2018-09-29 DIAGNOSIS — I82492 Acute embolism and thrombosis of other specified deep vein of left lower extremity: Secondary | ICD-10-CM | POA: Diagnosis not present

## 2018-09-29 DIAGNOSIS — D509 Iron deficiency anemia, unspecified: Secondary | ICD-10-CM | POA: Diagnosis not present

## 2018-09-29 DIAGNOSIS — N186 End stage renal disease: Secondary | ICD-10-CM | POA: Diagnosis not present

## 2018-09-29 DIAGNOSIS — N2581 Secondary hyperparathyroidism of renal origin: Secondary | ICD-10-CM | POA: Diagnosis not present

## 2018-09-30 ENCOUNTER — Ambulatory Visit (HOSPITAL_COMMUNITY): Payer: Self-pay | Admitting: Nurse Practitioner

## 2018-09-30 ENCOUNTER — Encounter (HOSPITAL_COMMUNITY): Payer: Self-pay | Admitting: *Deleted

## 2018-10-01 DIAGNOSIS — N2581 Secondary hyperparathyroidism of renal origin: Secondary | ICD-10-CM | POA: Diagnosis not present

## 2018-10-01 DIAGNOSIS — D509 Iron deficiency anemia, unspecified: Secondary | ICD-10-CM | POA: Diagnosis not present

## 2018-10-01 DIAGNOSIS — N186 End stage renal disease: Secondary | ICD-10-CM | POA: Diagnosis not present

## 2018-10-01 DIAGNOSIS — E1122 Type 2 diabetes mellitus with diabetic chronic kidney disease: Secondary | ICD-10-CM | POA: Diagnosis not present

## 2018-10-03 DIAGNOSIS — D509 Iron deficiency anemia, unspecified: Secondary | ICD-10-CM | POA: Diagnosis not present

## 2018-10-03 DIAGNOSIS — E1122 Type 2 diabetes mellitus with diabetic chronic kidney disease: Secondary | ICD-10-CM | POA: Diagnosis not present

## 2018-10-03 DIAGNOSIS — N186 End stage renal disease: Secondary | ICD-10-CM | POA: Diagnosis not present

## 2018-10-03 DIAGNOSIS — N2581 Secondary hyperparathyroidism of renal origin: Secondary | ICD-10-CM | POA: Diagnosis not present

## 2018-10-06 DIAGNOSIS — D509 Iron deficiency anemia, unspecified: Secondary | ICD-10-CM | POA: Diagnosis not present

## 2018-10-06 DIAGNOSIS — I82492 Acute embolism and thrombosis of other specified deep vein of left lower extremity: Secondary | ICD-10-CM | POA: Diagnosis not present

## 2018-10-06 DIAGNOSIS — N2581 Secondary hyperparathyroidism of renal origin: Secondary | ICD-10-CM | POA: Diagnosis not present

## 2018-10-06 DIAGNOSIS — N186 End stage renal disease: Secondary | ICD-10-CM | POA: Diagnosis not present

## 2018-10-06 DIAGNOSIS — E1122 Type 2 diabetes mellitus with diabetic chronic kidney disease: Secondary | ICD-10-CM | POA: Diagnosis not present

## 2018-10-07 ENCOUNTER — Ambulatory Visit (INDEPENDENT_AMBULATORY_CARE_PROVIDER_SITE_OTHER): Payer: Medicare Other | Admitting: General Practice

## 2018-10-07 DIAGNOSIS — I82502 Chronic embolism and thrombosis of unspecified deep veins of left lower extremity: Secondary | ICD-10-CM | POA: Diagnosis not present

## 2018-10-07 DIAGNOSIS — Z7901 Long term (current) use of anticoagulants: Secondary | ICD-10-CM | POA: Diagnosis not present

## 2018-10-07 LAB — PROTIME-INR: INR: 3.1 — AB (ref <=1.1)

## 2018-10-07 NOTE — Progress Notes (Signed)
Medical treatment/procedure(s) were performed by non-physician practitioner and as supervising physician I was immediately available for consultation/collaboration. I agree with above. Oluw[place]iwa Kinyon A Lagena Strand, MD  

## 2018-10-07 NOTE — Patient Instructions (Signed)
Pre visit review using our clinic review tool, if applicable. No additional management support is needed unless otherwise documented below in the visit note.  Please take 1/2 tablet today and then continue to take 1 tablet daily.  Re-check in 1 week. Spoke with patient and he verbalized understanding.  Dialysis patient.

## 2018-10-08 DIAGNOSIS — D509 Iron deficiency anemia, unspecified: Secondary | ICD-10-CM | POA: Diagnosis not present

## 2018-10-08 DIAGNOSIS — E1122 Type 2 diabetes mellitus with diabetic chronic kidney disease: Secondary | ICD-10-CM | POA: Diagnosis not present

## 2018-10-08 DIAGNOSIS — N2581 Secondary hyperparathyroidism of renal origin: Secondary | ICD-10-CM | POA: Diagnosis not present

## 2018-10-08 DIAGNOSIS — N186 End stage renal disease: Secondary | ICD-10-CM | POA: Diagnosis not present

## 2018-10-10 DIAGNOSIS — D509 Iron deficiency anemia, unspecified: Secondary | ICD-10-CM | POA: Diagnosis not present

## 2018-10-10 DIAGNOSIS — N186 End stage renal disease: Secondary | ICD-10-CM | POA: Diagnosis not present

## 2018-10-10 DIAGNOSIS — N2581 Secondary hyperparathyroidism of renal origin: Secondary | ICD-10-CM | POA: Diagnosis not present

## 2018-10-10 DIAGNOSIS — E1122 Type 2 diabetes mellitus with diabetic chronic kidney disease: Secondary | ICD-10-CM | POA: Diagnosis not present

## 2018-10-12 DIAGNOSIS — E1122 Type 2 diabetes mellitus with diabetic chronic kidney disease: Secondary | ICD-10-CM | POA: Diagnosis not present

## 2018-10-12 DIAGNOSIS — Z992 Dependence on renal dialysis: Secondary | ICD-10-CM | POA: Diagnosis not present

## 2018-10-12 DIAGNOSIS — N186 End stage renal disease: Secondary | ICD-10-CM | POA: Diagnosis not present

## 2018-10-13 DIAGNOSIS — Z992 Dependence on renal dialysis: Secondary | ICD-10-CM | POA: Diagnosis not present

## 2018-10-13 DIAGNOSIS — N186 End stage renal disease: Secondary | ICD-10-CM | POA: Diagnosis not present

## 2018-10-13 DIAGNOSIS — N2581 Secondary hyperparathyroidism of renal origin: Secondary | ICD-10-CM | POA: Diagnosis not present

## 2018-10-13 DIAGNOSIS — E1122 Type 2 diabetes mellitus with diabetic chronic kidney disease: Secondary | ICD-10-CM | POA: Diagnosis not present

## 2018-10-13 DIAGNOSIS — I82492 Acute embolism and thrombosis of other specified deep vein of left lower extremity: Secondary | ICD-10-CM | POA: Diagnosis not present

## 2018-10-14 ENCOUNTER — Ambulatory Visit (INDEPENDENT_AMBULATORY_CARE_PROVIDER_SITE_OTHER): Payer: Medicare Other | Admitting: General Practice

## 2018-10-14 DIAGNOSIS — Z7901 Long term (current) use of anticoagulants: Secondary | ICD-10-CM

## 2018-10-14 LAB — PROTIME-INR: INR: 2.3 — AB (ref <=1.1)

## 2018-10-14 NOTE — Patient Instructions (Signed)
Pre visit review using our clinic review tool, if applicable. No additional management support is needed unless otherwise documented below in the visit note.  Continue to take 1 tablet daily.  Re-check in 1 week. Spoke with patient and he verbalized understanding.  Dialysis patient.

## 2018-10-15 DIAGNOSIS — N2581 Secondary hyperparathyroidism of renal origin: Secondary | ICD-10-CM | POA: Diagnosis not present

## 2018-10-15 DIAGNOSIS — N186 End stage renal disease: Secondary | ICD-10-CM | POA: Diagnosis not present

## 2018-10-15 DIAGNOSIS — Z992 Dependence on renal dialysis: Secondary | ICD-10-CM | POA: Diagnosis not present

## 2018-10-15 DIAGNOSIS — E1122 Type 2 diabetes mellitus with diabetic chronic kidney disease: Secondary | ICD-10-CM | POA: Diagnosis not present

## 2018-10-17 NOTE — Progress Notes (Signed)
Medical treatment/procedure(s) were performed by non-physician practitioner and as supervising physician I was immediately available for consultation/collaboration. I agree with above. Navreet Bolda A Ottie Neglia, MD  

## 2018-10-18 DIAGNOSIS — N186 End stage renal disease: Secondary | ICD-10-CM | POA: Diagnosis not present

## 2018-10-18 DIAGNOSIS — Z992 Dependence on renal dialysis: Secondary | ICD-10-CM | POA: Diagnosis not present

## 2018-10-18 DIAGNOSIS — N2581 Secondary hyperparathyroidism of renal origin: Secondary | ICD-10-CM | POA: Diagnosis not present

## 2018-10-18 DIAGNOSIS — E1122 Type 2 diabetes mellitus with diabetic chronic kidney disease: Secondary | ICD-10-CM | POA: Diagnosis not present

## 2018-10-19 DIAGNOSIS — R635 Abnormal weight gain: Secondary | ICD-10-CM | POA: Diagnosis not present

## 2018-10-20 DIAGNOSIS — N186 End stage renal disease: Secondary | ICD-10-CM | POA: Diagnosis not present

## 2018-10-20 DIAGNOSIS — E1122 Type 2 diabetes mellitus with diabetic chronic kidney disease: Secondary | ICD-10-CM | POA: Diagnosis not present

## 2018-10-20 DIAGNOSIS — Z992 Dependence on renal dialysis: Secondary | ICD-10-CM | POA: Diagnosis not present

## 2018-10-20 DIAGNOSIS — I82492 Acute embolism and thrombosis of other specified deep vein of left lower extremity: Secondary | ICD-10-CM | POA: Diagnosis not present

## 2018-10-20 DIAGNOSIS — N2581 Secondary hyperparathyroidism of renal origin: Secondary | ICD-10-CM | POA: Diagnosis not present

## 2018-10-21 ENCOUNTER — Telehealth: Payer: Self-pay

## 2018-10-21 NOTE — Telephone Encounter (Signed)
fyi

## 2018-10-21 NOTE — Telephone Encounter (Signed)
Have not received the papers yet will keep a look out for them

## 2018-10-21 NOTE — Telephone Encounter (Signed)
We will need to review papers prior to visit to decide if we can fill out. He may need PT evaluation prior to our visit.

## 2018-10-21 NOTE — Telephone Encounter (Signed)
Copied from [place] [phone]. Topic: General - Other >> Oct 21, 2018 11:11 AM Alanda Slim E wrote: Reason for CRM: [employer] round called to have an appt scheduled for Pt to have a mobility eval for power chair. Ivin Booty from [employer] is faxing over paper work on 01.10.2020 with instructions on page 2 for Dr. Sharlet Salina. Pt is scheduled for Wed 01.15.2020 @ 10:40am

## 2018-10-22 DIAGNOSIS — E1122 Type 2 diabetes mellitus with diabetic chronic kidney disease: Secondary | ICD-10-CM | POA: Diagnosis not present

## 2018-10-22 DIAGNOSIS — N2581 Secondary hyperparathyroidism of renal origin: Secondary | ICD-10-CM | POA: Diagnosis not present

## 2018-10-22 DIAGNOSIS — Z992 Dependence on renal dialysis: Secondary | ICD-10-CM | POA: Diagnosis not present

## 2018-10-22 DIAGNOSIS — N186 End stage renal disease: Secondary | ICD-10-CM | POA: Diagnosis not present

## 2018-10-24 ENCOUNTER — Ambulatory Visit (INDEPENDENT_AMBULATORY_CARE_PROVIDER_SITE_OTHER): Payer: Medicare Other | Admitting: General Practice

## 2018-10-24 DIAGNOSIS — Z7901 Long term (current) use of anticoagulants: Secondary | ICD-10-CM

## 2018-10-24 LAB — POCT INR: INR: 1.7 — AB (ref 2.0–3.0)

## 2018-10-24 NOTE — Patient Instructions (Signed)
Pre visit review using our clinic review tool, if applicable. No additional management support is needed unless otherwise documented below in the visit note.  Take 1 1/2 tablets today and then continue to take 1 tablet daily.  Re-check in 1 week. Spoke with patient and he verbalized understanding.  Dialysis patient.

## 2018-10-24 NOTE — Telephone Encounter (Signed)
Called patient with the fax number that is on side A to have those forms re-faxed. Informed patient of MD response and that I will be back in touch with him after Dr. Sharlet Salina looks over the forms to see if he needs a PT evaluation or not. Patient stated understanding

## 2018-10-25 DIAGNOSIS — N186 End stage renal disease: Secondary | ICD-10-CM | POA: Diagnosis not present

## 2018-10-25 DIAGNOSIS — N2581 Secondary hyperparathyroidism of renal origin: Secondary | ICD-10-CM | POA: Diagnosis not present

## 2018-10-25 DIAGNOSIS — E1122 Type 2 diabetes mellitus with diabetic chronic kidney disease: Secondary | ICD-10-CM | POA: Diagnosis not present

## 2018-10-25 DIAGNOSIS — Z992 Dependence on renal dialysis: Secondary | ICD-10-CM | POA: Diagnosis not present

## 2018-10-26 ENCOUNTER — Ambulatory Visit: Payer: Self-pay | Admitting: Internal Medicine

## 2018-10-26 NOTE — Telephone Encounter (Signed)
Attempted to call pt back regarding the his appointment. No answer, left message for him to call back and ask to speak with NT.

## 2018-10-26 NOTE — Telephone Encounter (Signed)
LVM for patient stating that we still had not received any paperwork in regard to the motorized chair. Since we have not received it his appointment will be kind of pointless and he should cancel the appointment until we can get the forms faxed to [place] so we can look them over.

## 2018-10-27 DIAGNOSIS — Z992 Dependence on renal dialysis: Secondary | ICD-10-CM | POA: Diagnosis not present

## 2018-10-27 DIAGNOSIS — N2581 Secondary hyperparathyroidism of renal origin: Secondary | ICD-10-CM | POA: Diagnosis not present

## 2018-10-27 DIAGNOSIS — I82492 Acute embolism and thrombosis of other specified deep vein of left lower extremity: Secondary | ICD-10-CM | POA: Diagnosis not present

## 2018-10-27 DIAGNOSIS — N186 End stage renal disease: Secondary | ICD-10-CM | POA: Diagnosis not present

## 2018-10-27 DIAGNOSIS — E1122 Type 2 diabetes mellitus with diabetic chronic kidney disease: Secondary | ICD-10-CM | POA: Diagnosis not present

## 2018-10-28 LAB — PROTIME-INR: INR: 2.1 — AB (ref <=1.1)

## 2018-10-29 DIAGNOSIS — N2581 Secondary hyperparathyroidism of renal origin: Secondary | ICD-10-CM | POA: Diagnosis not present

## 2018-10-29 DIAGNOSIS — N186 End stage renal disease: Secondary | ICD-10-CM | POA: Diagnosis not present

## 2018-10-29 DIAGNOSIS — Z992 Dependence on renal dialysis: Secondary | ICD-10-CM | POA: Diagnosis not present

## 2018-10-29 DIAGNOSIS — E1122 Type 2 diabetes mellitus with diabetic chronic kidney disease: Secondary | ICD-10-CM | POA: Diagnosis not present

## 2018-11-01 ENCOUNTER — Ambulatory Visit (INDEPENDENT_AMBULATORY_CARE_PROVIDER_SITE_OTHER): Payer: Medicare Other | Admitting: General Practice

## 2018-11-01 DIAGNOSIS — Z7901 Long term (current) use of anticoagulants: Secondary | ICD-10-CM

## 2018-11-01 DIAGNOSIS — N2581 Secondary hyperparathyroidism of renal origin: Secondary | ICD-10-CM | POA: Diagnosis not present

## 2018-11-01 DIAGNOSIS — I82502 Chronic embolism and thrombosis of unspecified deep veins of left lower extremity: Secondary | ICD-10-CM | POA: Diagnosis not present

## 2018-11-01 DIAGNOSIS — N186 End stage renal disease: Secondary | ICD-10-CM | POA: Diagnosis not present

## 2018-11-01 DIAGNOSIS — E1122 Type 2 diabetes mellitus with diabetic chronic kidney disease: Secondary | ICD-10-CM | POA: Diagnosis not present

## 2018-11-01 DIAGNOSIS — Z992 Dependence on renal dialysis: Secondary | ICD-10-CM | POA: Diagnosis not present

## 2018-11-01 NOTE — Patient Instructions (Signed)
Pre visit review using our clinic review tool, if applicable. No additional management support is needed unless otherwise documented below in the visit note.  Continue to take 1 tablet daily.  Re-check in 1 week. Spoke with patient and he verbalized understanding.  Dialysis patient.

## 2018-11-01 NOTE — Progress Notes (Signed)
Medical treatment/procedure(s) were performed by non-physician practitioner and as supervising physician I was immediately available for consultation/collaboration. I agree with above. Cythnia Osmun A Ferol Laiche, MD  

## 2018-11-03 DIAGNOSIS — I82492 Acute embolism and thrombosis of other specified deep vein of left lower extremity: Secondary | ICD-10-CM | POA: Diagnosis not present

## 2018-11-03 DIAGNOSIS — N2581 Secondary hyperparathyroidism of renal origin: Secondary | ICD-10-CM | POA: Diagnosis not present

## 2018-11-03 DIAGNOSIS — Z992 Dependence on renal dialysis: Secondary | ICD-10-CM | POA: Diagnosis not present

## 2018-11-03 DIAGNOSIS — E1129 Type 2 diabetes mellitus with other diabetic kidney complication: Secondary | ICD-10-CM | POA: Diagnosis not present

## 2018-11-03 DIAGNOSIS — E1122 Type 2 diabetes mellitus with diabetic chronic kidney disease: Secondary | ICD-10-CM | POA: Diagnosis not present

## 2018-11-03 DIAGNOSIS — N186 End stage renal disease: Secondary | ICD-10-CM | POA: Diagnosis not present

## 2018-11-03 DIAGNOSIS — M109 Gout, unspecified: Secondary | ICD-10-CM | POA: Diagnosis not present

## 2018-11-04 LAB — PROTIME-INR: INR: 2.5 — AB (ref <=1.1)

## 2018-11-05 DIAGNOSIS — N2581 Secondary hyperparathyroidism of renal origin: Secondary | ICD-10-CM | POA: Diagnosis not present

## 2018-11-05 DIAGNOSIS — N186 End stage renal disease: Secondary | ICD-10-CM | POA: Diagnosis not present

## 2018-11-05 DIAGNOSIS — Z992 Dependence on renal dialysis: Secondary | ICD-10-CM | POA: Diagnosis not present

## 2018-11-05 DIAGNOSIS — E1122 Type 2 diabetes mellitus with diabetic chronic kidney disease: Secondary | ICD-10-CM | POA: Diagnosis not present

## 2018-11-07 ENCOUNTER — Ambulatory Visit (HOSPITAL_COMMUNITY)
Admission: RE | Admit: 2018-11-07 | Discharge: 2018-11-07 | Disposition: A | Discharge status: Home / Self Care | Payer: Medicare Other | Source: Ambulatory Visit | Attending: Nurse Practitioner | Admitting: Nurse Practitioner

## 2018-11-07 ENCOUNTER — Encounter (HOSPITAL_COMMUNITY): Payer: Self-pay | Admitting: Nurse Practitioner

## 2018-11-07 VITALS — BP 122/52 | HR 91 | Ht 70.0 in | Wt 312.0 lb

## 2018-11-07 DIAGNOSIS — I4510 Unspecified right bundle-branch block: Secondary | ICD-10-CM | POA: Insufficient documentation

## 2018-11-07 DIAGNOSIS — Z7901 Long term (current) use of anticoagulants: Secondary | ICD-10-CM | POA: Insufficient documentation

## 2018-11-07 DIAGNOSIS — F4310 Post-traumatic stress disorder, unspecified: Secondary | ICD-10-CM | POA: Diagnosis not present

## 2018-11-07 DIAGNOSIS — K219 Gastro-esophageal reflux disease without esophagitis: Secondary | ICD-10-CM | POA: Diagnosis not present

## 2018-11-07 DIAGNOSIS — M109 Gout, unspecified: Secondary | ICD-10-CM | POA: Diagnosis not present

## 2018-11-07 DIAGNOSIS — E785 Hyperlipidemia, unspecified: Secondary | ICD-10-CM | POA: Diagnosis not present

## 2018-11-07 DIAGNOSIS — Z91018 Allergy to other foods: Secondary | ICD-10-CM | POA: Insufficient documentation

## 2018-11-07 DIAGNOSIS — E1142 Type 2 diabetes mellitus with diabetic polyneuropathy: Secondary | ICD-10-CM | POA: Insufficient documentation

## 2018-11-07 DIAGNOSIS — Z79899 Other long term (current) drug therapy: Secondary | ICD-10-CM | POA: Diagnosis not present

## 2018-11-07 DIAGNOSIS — F40240 Claustrophobia: Secondary | ICD-10-CM | POA: Diagnosis not present

## 2018-11-07 DIAGNOSIS — Z6841 Body Mass Index (BMI) 40.0 and over, adult: Secondary | ICD-10-CM | POA: Insufficient documentation

## 2018-11-07 DIAGNOSIS — Z992 Dependence on renal dialysis: Secondary | ICD-10-CM | POA: Insufficient documentation

## 2018-11-07 DIAGNOSIS — I351 Nonrheumatic aortic (valve) insufficiency: Secondary | ICD-10-CM | POA: Insufficient documentation

## 2018-11-07 DIAGNOSIS — Z794 Long term (current) use of insulin: Secondary | ICD-10-CM | POA: Insufficient documentation

## 2018-11-07 DIAGNOSIS — Z888 Allergy status to other drugs, medicaments and biological substances status: Secondary | ICD-10-CM | POA: Insufficient documentation

## 2018-11-07 DIAGNOSIS — R9431 Abnormal electrocardiogram [ECG] [EKG]: Secondary | ICD-10-CM | POA: Diagnosis not present

## 2018-11-07 DIAGNOSIS — N186 End stage renal disease: Secondary | ICD-10-CM | POA: Diagnosis not present

## 2018-11-07 DIAGNOSIS — E11319 Type 2 diabetes mellitus with unspecified diabetic retinopathy without macular edema: Secondary | ICD-10-CM | POA: Diagnosis not present

## 2018-11-07 DIAGNOSIS — I4892 Unspecified atrial flutter: Secondary | ICD-10-CM | POA: Diagnosis not present

## 2018-11-07 DIAGNOSIS — I509 Heart failure, unspecified: Secondary | ICD-10-CM | POA: Diagnosis not present

## 2018-11-07 DIAGNOSIS — Z8249 Family history of ischemic heart disease and other diseases of the circulatory system: Secondary | ICD-10-CM | POA: Insufficient documentation

## 2018-11-07 DIAGNOSIS — I130 Hypertensive heart and chronic kidney disease with heart failure and stage 1 through stage 4 chronic kidney disease, or unspecified chronic kidney disease: Secondary | ICD-10-CM | POA: Insufficient documentation

## 2018-11-07 DIAGNOSIS — J449 Chronic obstructive pulmonary disease, unspecified: Secondary | ICD-10-CM | POA: Insufficient documentation

## 2018-11-07 DIAGNOSIS — Z885 Allergy status to narcotic agent status: Secondary | ICD-10-CM | POA: Insufficient documentation

## 2018-11-07 DIAGNOSIS — G4733 Obstructive sleep apnea (adult) (pediatric): Secondary | ICD-10-CM | POA: Insufficient documentation

## 2018-11-07 DIAGNOSIS — E1122 Type 2 diabetes mellitus with diabetic chronic kidney disease: Secondary | ICD-10-CM | POA: Insufficient documentation

## 2018-11-07 DIAGNOSIS — Z833 Family history of diabetes mellitus: Secondary | ICD-10-CM | POA: Insufficient documentation

## 2018-11-07 DIAGNOSIS — Z940 Kidney transplant status: Secondary | ICD-10-CM | POA: Insufficient documentation

## 2018-11-08 DIAGNOSIS — N186 End stage renal disease: Secondary | ICD-10-CM | POA: Diagnosis not present

## 2018-11-08 DIAGNOSIS — Z992 Dependence on renal dialysis: Secondary | ICD-10-CM | POA: Diagnosis not present

## 2018-11-08 DIAGNOSIS — N2581 Secondary hyperparathyroidism of renal origin: Secondary | ICD-10-CM | POA: Diagnosis not present

## 2018-11-08 DIAGNOSIS — E1122 Type 2 diabetes mellitus with diabetic chronic kidney disease: Secondary | ICD-10-CM | POA: Diagnosis not present

## 2018-11-08 NOTE — Progress Notes (Signed)
Primary Care Physician: Hoyt Koch, MD Referring Physician:[facility] ER f/u   [name] [name] is a 53 y.o. male with a h/o CKD on dialysis, atrail flutter, DVT CHF, DM, that was sent to the ER following dialysis for hypotension and tachycardia. Pt was asymptomatic. He was discharged home without changes. He was asked to f/u in the afib clinc.  Review of EKG's showed atrial flutter on many of them over the years. He had previously been on BB but this was stopped for hypotensive episodes after dialysis, which pt states is common to occur.   Pt states that he never  feels the fast heart beat and he did not want to go to the ER on the day he was sent. He is in SR today at 91 bpm.   Today, he denies symptoms of palpitations, chest pain, shortness of breath, orthopnea, PND, lower extremity edema, dizziness, presyncope, syncope, or neurologic sequela. The patient is tolerating medications without difficulties and is otherwise without complaint today.   Past Medical History:  Diagnosis Date  . Allergic rhinitis   . Anemia   . Aortic insufficiency    a. Echo 7/16:  Mod LVH, EF 50-55%, mild to mod AI, severe LAE, PASP 57 mmHg (LV ID end diastolic 32.1 mm);  b. Echo 2/17:  Mod LVH, EF 50-55%, mod AI, MAC, mild MR, mod LAE, mild to mod TR, PASP 63 mmHg  . Bilateral carpal tunnel syndrome   . CHF (congestive heart failure) ([place])   . Claustrophobia   . COPD (chronic obstructive pulmonary disease) ([place])   . Diabetes mellitus    Type II  . Diabetic peripheral neuropathy ([place])   . Diabetic retinopathy ([place])   . Discitis of lumbar region    resolved  . DVT (deep venous thrombosis) ([place]) 10/07/2015   LLE  . Endocarditis 11/28/14  . ESRD (end stage renal disease) on dialysis [facility])    s/p renal transplant in 2009, failed in 2016  . Gait disorder   . Gastroparesis   . GERD (gastroesophageal reflux disease)   . Gout   . Hearing difficulty   . Hyperlipidemia   . Hypertension   . Incomplete  bladder emptying   . Leg pain 02/05/11   with walking  . Morbid obesity ([place])   . Obstructive sleep apnea    not using CPAP- lost 100lbs  . Osteomyelitis ([place])   . Posttraumatic stress disorder   . Rotator cuff disorder   . Secondary hyperparathyroidism ([place])   . Sepsis ([place])    Postive blood cultures -   . Urethral stricture   . Vitamin D deficiency    Past Surgical History:  Procedure Laterality Date  . ARTERIOVENOUS GRAFT PLACEMENT Bilateral "several"  . AV FISTULA PLACEMENT Bilateral 2015  . BASCILIC VEIN TRANSPOSITION Right 09/27/2015  . [place] Right 09/27/2015   Procedure: BASILIC VEIN TRANSPOSITION right;  Surgeon: Mal Misty, MD;  Location: [facility];  Service: Vascular;  Laterality: Right;  . CATARACT EXTRACTION W/ INTRAOCULAR LENS  IMPLANT, BILATERAL Bilateral   . CYSTOSCOPY W/ INTERNAL URETHROTOMY  09/2014  . KIDNEY TRANSPLANT  2009    Current Outpatient Medications  Medication Sig Dispense Refill  . allopurinol (ZYLOPRIM) 100 MG tablet Take 1 tablet (100 mg total) by mouth 3 (three) times a week. After dialysis 30 tablet 6  . calcium acetate (PHOSLO) 667 MG capsule Take 3 capsules (2,001 mg total) by mouth 3 (three) times daily with meals. 270 capsule 0  .  clonazePAM (KLONOPIN) 1 MG tablet     . DROPLET PEN NEEDLES 32G X 4 MM MISC USE  TO INJECT FIVE TIMES DAILY AS DIRECTED 500 each 1  . glucose blood (ACCU-CHEK GUIDE) test strip 1 each by Other route as needed for other. Use as instructed to check blood sugar 4 times daily. DX:E11.65 400 each 1  . glucose blood (ACCU-CHEK SMARTVIEW) test strip Use to test 4 times daily DX code E11.29 125 each 5  . hydrocortisone-pramoxine (PROCTOFOAM-HC) rectal foam Place 1 applicator rectally 3 (three) times daily. Prn hemorrhoids 10 g 0  . Insulin Glargine (TOUJEO SOLOSTAR) 300 UNIT/ML SOPN Inject 70-80 Units into the skin 2 (two) times daily. Inject 80 units in the morning and inject 70 units in the evening. 270  mL 1  . Insulin Pen Needle 32G X 8 MM MISC Use as directed 100 each 0  . insulin regular human CONCENTRATED (HUMULIN R U-500 KWIKPEN) 500 UNIT/ML kwikpen INJECT SUBCUTANEOUSLY 15 UNITS AT BREAKFAST, 25 UNITS AT LUNCH AND 25-30 UNITS BEFORE SUPPER 18 mL 1  . midodrine (PROAMATINE) 10 MG tablet Take 10 mg by mouth as needed (low blood pressure).     Marland Kitchen omeprazole (PRILOSEC) 20 MG capsule TAKE 1 CAPSULE EVERY DAY 90 capsule 2  . ONE TOUCH LANCETS MISC Use to test sugar 4 times daily 400 each 0  . RENVELA 800 MG tablet Take 2,400 mg by mouth 3 (three) times daily with meals.     . rosuvastatin (CRESTOR) 20 MG tablet TAKE 1 TABLET AT BEDTIME 90 tablet 0  . Suvorexant (BELSOMRA) 10 MG TABS Take 10 mg by mouth at bedtime. 30 tablet 1  . triamcinolone cream (KENALOG) 0.1 % Apply 1 application topically 2 (two) times daily. 100 g 2  . VELTASSA 8.4 g packet Take 8.4 g by mouth daily.     Marland Kitchen warfarin (COUMADIN) 5 MG tablet Take 1 (5 mg) tablet every day except for Wednesday & Saturday when you take 7.82m (1.5 tablets) OR As directed. 105 tablet 0  . hydrocortisone cream 1 % Apply to affected area 2 times daily (Patient not taking: Reported on 11/07/2018) 15 g 0   No current facility-administered medications for this encounter.     Allergies  Allergen Reactions  . Pork-Derived Products Anaphylaxis and Other (See Comments)    Fever also   . Ace Inhibitors Cough  . Hydrocodone Other (See Comments)    Hallucinations    Social History   Socioeconomic History  . Marital status: Divorced    Spouse name: Not on file  . Number of children: 0  . Years of education: College   . Highest education level: Not on file  Occupational History    Employer: UNEMPLOYED  Social Needs  . Financial resource strain: Not on file  . Food insecurity:    Worry: Not on file    Inability: Not on file  . Transportation needs:    Medical: Not on file    Non-medical: Not on file  Tobacco Use  . Smoking status: Never  Smoker  . Smokeless tobacco: Never Used  Substance and Sexual Activity  . Alcohol use: No    Alcohol/week: 0.0 standard drinks  . Drug use: No  . Sexual activity: Not Currently  Lifestyle  . Physical activity:    Days per week: Not on file    Minutes per session: Not on file  . Stress: Not on file  Relationships  . Social connections:  Talks on phone: Not on file    Gets together: Not on file    Attends religious service: Not on file    Active member of club or organization: Not on file    Attends meetings of clubs or organizations: Not on file    Relationship status: Not on file  . Intimate partner violence:    Fear of current or ex partner: Not on file    Emotionally abused: Not on file    Physically abused: Not on file    Forced sexual activity: Not on file  Other Topics Concern  . Not on file  Social History Narrative   Patient is Divorced.   Patient does not have any children.   Patient is right-handed   Patient is currently in college working on his Ph.D.       Family History  Problem Relation Age of Onset  . Hypertension Mother   . Diabetes Father     ROS- All systems are reviewed and negative except as per the HPI above  Physical Exam: Vitals:   11/07/18 1048  BP: (!) 122/52  Pulse: 91  Weight: (!) 141.5 kg  Height: _0  (1.778 m)   Wt Readings from Last 3 Encounters:  11/07/18 (!) 141.5 kg  09/27/18 (!) 142 kg  09/14/18 (!) 144.9 kg    Labs: Lab Results  Component Value Date   NA 139 09/27/2018   K 4.9 09/27/2018   CL 98 09/27/2018   CO2 26 09/27/2018   GLUCOSE 247 (H) 09/27/2018   BUN 30 (H) 09/27/2018   CREATININE 6.15 (H) 09/27/2018   CALCIUM 8.7 (L) 09/27/2018   PHOS 3.5 11/16/2015   MG 2.4 12/12/2007   Lab Results  Component Value Date   INR 2.1 (A) 10/28/2018   Lab Results  Component Value Date   CHOL 177 09/12/2018   HDL 31.90 (L) 09/12/2018   LDLCALC 109 (H) 09/12/2018   TRIG 180.0 (H) 09/12/2018     GEN- The  patient is well appearing, alert and oriented x 3 today.   Head- normocephalic, atraumatic Eyes-  Sclera clear, conjunctiva pink Ears- hearing intact Oropharynx- clear Neck- supple, no JVP Lymph- no cervical lymphadenopathy Lungs- Clear to ausculation bilaterally, normal work of breathing Heart- Regular rate and rhythm, no murmurs, rubs or gallops, PMI not laterally displaced GI- soft, NT, ND, + BS Extremities- no clubbing, cyanosis, or edema MS- no significant deformity or atrophy Skin- no rash or lesion Psych- euthymic mood, full affect Neuro- strength and sensation are intact  EKG- NSR with RBBB at 91 bnpm Echo-Study Conclusions  - Left ventricle: The cavity size was normal. Wall thickness was   increased in a pattern of severe LVH. Systolic function was   normal. The estimated ejection fraction was in the range of 60%   to 65%. Wall motion was normal; there were no regional wall   motion abnormalities. Doppler parameters are consistent with a   reversible restrictive pattern, indicative of decreased left   ventricular diastolic compliance and/or increased left atrial   pressure (grade 3 diastolic dysfunction). - Aortic valve: There was mild regurgitation.   Assessment and Plan: 1. Atrial flutter  H/o same Pt asymptomatic He is not on rate control as it was contributing to hypotensive episodes following dialysis and stopped at some point For now I do not see any good choices, if worsens amiodarone 2/2 renal function would be his AAD of choice but I hate to start this drug now as  he is asymptomatic with episodes and concern for potential side effects  2. CHA2DS2VASc score of at least 5 Continue  warfarin  Last INR 2.1  I will forward my note to Dr. Angelena Form to see if he has any suggestions but for now think best to treat conservatively  Butch Penny C. Ellwood Steidle, [facility] [address] [place], [place] [address] [phone]

## 2018-11-10 DIAGNOSIS — N2581 Secondary hyperparathyroidism of renal origin: Secondary | ICD-10-CM | POA: Diagnosis not present

## 2018-11-10 DIAGNOSIS — Z992 Dependence on renal dialysis: Secondary | ICD-10-CM | POA: Diagnosis not present

## 2018-11-10 DIAGNOSIS — N186 End stage renal disease: Secondary | ICD-10-CM | POA: Diagnosis not present

## 2018-11-10 DIAGNOSIS — I82492 Acute embolism and thrombosis of other specified deep vein of left lower extremity: Secondary | ICD-10-CM | POA: Diagnosis not present

## 2018-11-10 DIAGNOSIS — E1122 Type 2 diabetes mellitus with diabetic chronic kidney disease: Secondary | ICD-10-CM | POA: Diagnosis not present

## 2018-11-11 ENCOUNTER — Ambulatory Visit (INDEPENDENT_AMBULATORY_CARE_PROVIDER_SITE_OTHER): Payer: Medicare Other | Admitting: General Practice

## 2018-11-11 DIAGNOSIS — Z7901 Long term (current) use of anticoagulants: Secondary | ICD-10-CM

## 2018-11-11 NOTE — Patient Instructions (Signed)
Pre visit review using our clinic review tool, if applicable. No additional management support is needed unless otherwise documented below in the visit note. Continue to take 1 tablet daily.  Re-check in 1 week. Spoke with patient and he verbalized understanding.  Dialysis patient.

## 2018-11-11 NOTE — Progress Notes (Signed)
Medical treatment/procedure(s) were performed by non-physician practitioner and as supervising physician I was immediately available for consultation/collaboration. I agree with above. Sekai Gitlin A Aiken Withem, MD  

## 2018-11-12 DIAGNOSIS — N2581 Secondary hyperparathyroidism of renal origin: Secondary | ICD-10-CM | POA: Diagnosis not present

## 2018-11-12 DIAGNOSIS — D509 Iron deficiency anemia, unspecified: Secondary | ICD-10-CM | POA: Diagnosis not present

## 2018-11-12 DIAGNOSIS — Z992 Dependence on renal dialysis: Secondary | ICD-10-CM | POA: Diagnosis not present

## 2018-11-12 DIAGNOSIS — N186 End stage renal disease: Secondary | ICD-10-CM | POA: Diagnosis not present

## 2018-11-12 DIAGNOSIS — E1122 Type 2 diabetes mellitus with diabetic chronic kidney disease: Secondary | ICD-10-CM | POA: Diagnosis not present

## 2018-11-14 DIAGNOSIS — E1122 Type 2 diabetes mellitus with diabetic chronic kidney disease: Secondary | ICD-10-CM | POA: Diagnosis not present

## 2018-11-14 DIAGNOSIS — D509 Iron deficiency anemia, unspecified: Secondary | ICD-10-CM | POA: Diagnosis not present

## 2018-11-14 DIAGNOSIS — N2581 Secondary hyperparathyroidism of renal origin: Secondary | ICD-10-CM | POA: Diagnosis not present

## 2018-11-14 DIAGNOSIS — N186 End stage renal disease: Secondary | ICD-10-CM | POA: Diagnosis not present

## 2018-11-15 DIAGNOSIS — Z961 Presence of intraocular lens: Secondary | ICD-10-CM | POA: Diagnosis not present

## 2018-11-15 DIAGNOSIS — H52203 Unspecified astigmatism, bilateral: Secondary | ICD-10-CM | POA: Diagnosis not present

## 2018-11-15 DIAGNOSIS — H5202 Hypermetropia, left eye: Secondary | ICD-10-CM | POA: Diagnosis not present

## 2018-11-15 DIAGNOSIS — H04123 Dry eye syndrome of bilateral lacrimal glands: Secondary | ICD-10-CM | POA: Diagnosis not present

## 2018-11-15 DIAGNOSIS — H5211 Myopia, right eye: Secondary | ICD-10-CM | POA: Diagnosis not present

## 2018-11-15 DIAGNOSIS — H524 Presbyopia: Secondary | ICD-10-CM | POA: Diagnosis not present

## 2018-11-15 DIAGNOSIS — E113593 Type 2 diabetes mellitus with proliferative diabetic retinopathy without macular edema, bilateral: Secondary | ICD-10-CM | POA: Diagnosis not present

## 2018-11-17 DIAGNOSIS — D509 Iron deficiency anemia, unspecified: Secondary | ICD-10-CM | POA: Diagnosis not present

## 2018-11-17 DIAGNOSIS — N2581 Secondary hyperparathyroidism of renal origin: Secondary | ICD-10-CM | POA: Diagnosis not present

## 2018-11-17 DIAGNOSIS — N186 End stage renal disease: Secondary | ICD-10-CM | POA: Diagnosis not present

## 2018-11-17 DIAGNOSIS — E1122 Type 2 diabetes mellitus with diabetic chronic kidney disease: Secondary | ICD-10-CM | POA: Diagnosis not present

## 2018-11-17 DIAGNOSIS — M109 Gout, unspecified: Secondary | ICD-10-CM | POA: Diagnosis not present

## 2018-11-18 ENCOUNTER — Encounter: Payer: Self-pay | Admitting: Internal Medicine

## 2018-11-18 DIAGNOSIS — R635 Abnormal weight gain: Secondary | ICD-10-CM | POA: Diagnosis not present

## 2018-11-19 DIAGNOSIS — D509 Iron deficiency anemia, unspecified: Secondary | ICD-10-CM | POA: Diagnosis not present

## 2018-11-19 DIAGNOSIS — N2581 Secondary hyperparathyroidism of renal origin: Secondary | ICD-10-CM | POA: Diagnosis not present

## 2018-11-19 DIAGNOSIS — N186 End stage renal disease: Secondary | ICD-10-CM | POA: Diagnosis not present

## 2018-11-19 DIAGNOSIS — E1122 Type 2 diabetes mellitus with diabetic chronic kidney disease: Secondary | ICD-10-CM | POA: Diagnosis not present

## 2018-11-22 DIAGNOSIS — N2581 Secondary hyperparathyroidism of renal origin: Secondary | ICD-10-CM | POA: Diagnosis not present

## 2018-11-22 DIAGNOSIS — E1122 Type 2 diabetes mellitus with diabetic chronic kidney disease: Secondary | ICD-10-CM | POA: Diagnosis not present

## 2018-11-22 DIAGNOSIS — N186 End stage renal disease: Secondary | ICD-10-CM | POA: Diagnosis not present

## 2018-11-22 DIAGNOSIS — D509 Iron deficiency anemia, unspecified: Secondary | ICD-10-CM | POA: Diagnosis not present

## 2018-11-24 ENCOUNTER — Other Ambulatory Visit: Payer: Self-pay | Admitting: Internal Medicine

## 2018-11-24 DIAGNOSIS — N186 End stage renal disease: Secondary | ICD-10-CM | POA: Diagnosis not present

## 2018-11-24 DIAGNOSIS — E1122 Type 2 diabetes mellitus with diabetic chronic kidney disease: Secondary | ICD-10-CM | POA: Diagnosis not present

## 2018-11-24 DIAGNOSIS — I82492 Acute embolism and thrombosis of other specified deep vein of left lower extremity: Secondary | ICD-10-CM | POA: Diagnosis not present

## 2018-11-24 DIAGNOSIS — N2581 Secondary hyperparathyroidism of renal origin: Secondary | ICD-10-CM | POA: Diagnosis not present

## 2018-11-24 DIAGNOSIS — D509 Iron deficiency anemia, unspecified: Secondary | ICD-10-CM | POA: Diagnosis not present

## 2018-11-26 DIAGNOSIS — N186 End stage renal disease: Secondary | ICD-10-CM | POA: Diagnosis not present

## 2018-11-26 DIAGNOSIS — D509 Iron deficiency anemia, unspecified: Secondary | ICD-10-CM | POA: Diagnosis not present

## 2018-11-26 DIAGNOSIS — E1122 Type 2 diabetes mellitus with diabetic chronic kidney disease: Secondary | ICD-10-CM | POA: Diagnosis not present

## 2018-11-26 DIAGNOSIS — N2581 Secondary hyperparathyroidism of renal origin: Secondary | ICD-10-CM | POA: Diagnosis not present

## 2018-11-29 DIAGNOSIS — N186 End stage renal disease: Secondary | ICD-10-CM | POA: Diagnosis not present

## 2018-11-29 DIAGNOSIS — D509 Iron deficiency anemia, unspecified: Secondary | ICD-10-CM | POA: Diagnosis not present

## 2018-11-29 DIAGNOSIS — E1122 Type 2 diabetes mellitus with diabetic chronic kidney disease: Secondary | ICD-10-CM | POA: Diagnosis not present

## 2018-11-29 DIAGNOSIS — N2581 Secondary hyperparathyroidism of renal origin: Secondary | ICD-10-CM | POA: Diagnosis not present

## 2018-11-30 ENCOUNTER — Other Ambulatory Visit: Payer: Self-pay | Admitting: Endocrinology

## 2018-12-01 DIAGNOSIS — N2581 Secondary hyperparathyroidism of renal origin: Secondary | ICD-10-CM | POA: Diagnosis not present

## 2018-12-01 DIAGNOSIS — N186 End stage renal disease: Secondary | ICD-10-CM | POA: Diagnosis not present

## 2018-12-01 DIAGNOSIS — I82492 Acute embolism and thrombosis of other specified deep vein of left lower extremity: Secondary | ICD-10-CM | POA: Diagnosis not present

## 2018-12-01 DIAGNOSIS — E1122 Type 2 diabetes mellitus with diabetic chronic kidney disease: Secondary | ICD-10-CM | POA: Diagnosis not present

## 2018-12-01 DIAGNOSIS — D509 Iron deficiency anemia, unspecified: Secondary | ICD-10-CM | POA: Diagnosis not present

## 2018-12-02 ENCOUNTER — Ambulatory Visit (INDEPENDENT_AMBULATORY_CARE_PROVIDER_SITE_OTHER): Payer: Medicare Other | Admitting: General Practice

## 2018-12-02 DIAGNOSIS — Z7901 Long term (current) use of anticoagulants: Secondary | ICD-10-CM | POA: Diagnosis not present

## 2018-12-02 LAB — PROTIME-INR: INR: 2.1 — AB (ref <=1.1)

## 2018-12-02 NOTE — Patient Instructions (Signed)
Pre visit review using our clinic review tool, if applicable. No additional management support is needed unless otherwise documented below in the visit note.  Continue to take 1 tablet daily.  Re-check in 1 week. Spoke with patient and he verbalized understanding.  Dialysis patient.

## 2018-12-03 DIAGNOSIS — N2581 Secondary hyperparathyroidism of renal origin: Secondary | ICD-10-CM | POA: Diagnosis not present

## 2018-12-03 DIAGNOSIS — N186 End stage renal disease: Secondary | ICD-10-CM | POA: Diagnosis not present

## 2018-12-03 DIAGNOSIS — D509 Iron deficiency anemia, unspecified: Secondary | ICD-10-CM | POA: Diagnosis not present

## 2018-12-03 DIAGNOSIS — E1122 Type 2 diabetes mellitus with diabetic chronic kidney disease: Secondary | ICD-10-CM | POA: Diagnosis not present

## 2018-12-05 NOTE — Progress Notes (Signed)
Medical treatment/procedure(s) were performed by non-physician practitioner and as supervising physician I was immediately available for consultation/collaboration. I agree with above. Elizabeth A Crawford, MD  

## 2018-12-06 DIAGNOSIS — D509 Iron deficiency anemia, unspecified: Secondary | ICD-10-CM | POA: Diagnosis not present

## 2018-12-06 DIAGNOSIS — N2581 Secondary hyperparathyroidism of renal origin: Secondary | ICD-10-CM | POA: Diagnosis not present

## 2018-12-06 DIAGNOSIS — N186 End stage renal disease: Secondary | ICD-10-CM | POA: Diagnosis not present

## 2018-12-06 DIAGNOSIS — E1122 Type 2 diabetes mellitus with diabetic chronic kidney disease: Secondary | ICD-10-CM | POA: Diagnosis not present

## 2018-12-08 DIAGNOSIS — N186 End stage renal disease: Secondary | ICD-10-CM | POA: Diagnosis not present

## 2018-12-08 DIAGNOSIS — D509 Iron deficiency anemia, unspecified: Secondary | ICD-10-CM | POA: Diagnosis not present

## 2018-12-08 DIAGNOSIS — E1122 Type 2 diabetes mellitus with diabetic chronic kidney disease: Secondary | ICD-10-CM | POA: Diagnosis not present

## 2018-12-08 DIAGNOSIS — I82492 Acute embolism and thrombosis of other specified deep vein of left lower extremity: Secondary | ICD-10-CM | POA: Diagnosis not present

## 2018-12-08 DIAGNOSIS — N2581 Secondary hyperparathyroidism of renal origin: Secondary | ICD-10-CM | POA: Diagnosis not present

## 2018-12-09 LAB — PROTIME-INR: INR: 2.4 — AB (ref <=1.1)

## 2018-12-10 DIAGNOSIS — N2581 Secondary hyperparathyroidism of renal origin: Secondary | ICD-10-CM | POA: Diagnosis not present

## 2018-12-10 DIAGNOSIS — N186 End stage renal disease: Secondary | ICD-10-CM | POA: Diagnosis not present

## 2018-12-10 DIAGNOSIS — D509 Iron deficiency anemia, unspecified: Secondary | ICD-10-CM | POA: Diagnosis not present

## 2018-12-10 DIAGNOSIS — E1122 Type 2 diabetes mellitus with diabetic chronic kidney disease: Secondary | ICD-10-CM | POA: Diagnosis not present

## 2018-12-11 DIAGNOSIS — N186 End stage renal disease: Secondary | ICD-10-CM | POA: Diagnosis not present

## 2018-12-11 DIAGNOSIS — E1122 Type 2 diabetes mellitus with diabetic chronic kidney disease: Secondary | ICD-10-CM | POA: Diagnosis not present

## 2018-12-11 DIAGNOSIS — Z992 Dependence on renal dialysis: Secondary | ICD-10-CM | POA: Diagnosis not present

## 2018-12-12 ENCOUNTER — Other Ambulatory Visit: Payer: Self-pay

## 2018-12-12 DIAGNOSIS — D509 Iron deficiency anemia, unspecified: Secondary | ICD-10-CM | POA: Diagnosis not present

## 2018-12-12 DIAGNOSIS — D631 Anemia in chronic kidney disease: Secondary | ICD-10-CM | POA: Diagnosis not present

## 2018-12-12 DIAGNOSIS — N2581 Secondary hyperparathyroidism of renal origin: Secondary | ICD-10-CM | POA: Diagnosis not present

## 2018-12-12 DIAGNOSIS — R5383 Other fatigue: Secondary | ICD-10-CM | POA: Diagnosis not present

## 2018-12-12 DIAGNOSIS — E1165 Type 2 diabetes mellitus with hyperglycemia: Secondary | ICD-10-CM | POA: Diagnosis not present

## 2018-12-12 DIAGNOSIS — D539 Nutritional anemia, unspecified: Secondary | ICD-10-CM | POA: Diagnosis not present

## 2018-12-12 DIAGNOSIS — E559 Vitamin D deficiency, unspecified: Secondary | ICD-10-CM | POA: Diagnosis not present

## 2018-12-12 DIAGNOSIS — E1122 Type 2 diabetes mellitus with diabetic chronic kidney disease: Secondary | ICD-10-CM | POA: Diagnosis not present

## 2018-12-12 DIAGNOSIS — N186 End stage renal disease: Secondary | ICD-10-CM | POA: Diagnosis not present

## 2018-12-12 DIAGNOSIS — R635 Abnormal weight gain: Secondary | ICD-10-CM | POA: Diagnosis not present

## 2018-12-13 ENCOUNTER — Ambulatory Visit (INDEPENDENT_AMBULATORY_CARE_PROVIDER_SITE_OTHER): Payer: Medicare Other | Admitting: General Practice

## 2018-12-13 DIAGNOSIS — Z7901 Long term (current) use of anticoagulants: Secondary | ICD-10-CM | POA: Diagnosis not present

## 2018-12-13 DIAGNOSIS — I82502 Chronic embolism and thrombosis of unspecified deep veins of left lower extremity: Secondary | ICD-10-CM

## 2018-12-13 NOTE — Patient Instructions (Signed)
Pre visit review using our clinic review tool, if applicable. No additional management support is needed unless otherwise documented below in the visit note.  Continue to take 1 tablet daily.  Re-check in 1 week. Spoke with patient and he verbalized understanding.  Dialysis patient.

## 2018-12-13 NOTE — Progress Notes (Signed)
Medical treatment/procedure(s) were performed by non-physician practitioner and as supervising physician I was immediately available for consultation/collaboration. I agree with above. Elizabeth A Crawford, MD  

## 2018-12-14 ENCOUNTER — Ambulatory Visit: Payer: Self-pay | Admitting: Endocrinology

## 2018-12-15 DIAGNOSIS — N186 End stage renal disease: Secondary | ICD-10-CM | POA: Diagnosis not present

## 2018-12-15 DIAGNOSIS — I82492 Acute embolism and thrombosis of other specified deep vein of left lower extremity: Secondary | ICD-10-CM | POA: Diagnosis not present

## 2018-12-15 DIAGNOSIS — D631 Anemia in chronic kidney disease: Secondary | ICD-10-CM | POA: Diagnosis not present

## 2018-12-15 DIAGNOSIS — N2581 Secondary hyperparathyroidism of renal origin: Secondary | ICD-10-CM | POA: Diagnosis not present

## 2018-12-15 DIAGNOSIS — E1122 Type 2 diabetes mellitus with diabetic chronic kidney disease: Secondary | ICD-10-CM | POA: Diagnosis not present

## 2018-12-15 DIAGNOSIS — M109 Gout, unspecified: Secondary | ICD-10-CM | POA: Diagnosis not present

## 2018-12-15 DIAGNOSIS — D509 Iron deficiency anemia, unspecified: Secondary | ICD-10-CM | POA: Diagnosis not present

## 2018-12-16 ENCOUNTER — Ambulatory Visit: Payer: Self-pay | Admitting: Internal Medicine

## 2018-12-16 LAB — PROTIME-INR: INR: 1.8 — AB (ref <=1.1)

## 2018-12-17 DIAGNOSIS — D509 Iron deficiency anemia, unspecified: Secondary | ICD-10-CM | POA: Diagnosis not present

## 2018-12-17 DIAGNOSIS — E1122 Type 2 diabetes mellitus with diabetic chronic kidney disease: Secondary | ICD-10-CM | POA: Diagnosis not present

## 2018-12-17 DIAGNOSIS — D631 Anemia in chronic kidney disease: Secondary | ICD-10-CM | POA: Diagnosis not present

## 2018-12-17 DIAGNOSIS — N2581 Secondary hyperparathyroidism of renal origin: Secondary | ICD-10-CM | POA: Diagnosis not present

## 2018-12-17 DIAGNOSIS — N186 End stage renal disease: Secondary | ICD-10-CM | POA: Diagnosis not present

## 2018-12-19 DIAGNOSIS — N2581 Secondary hyperparathyroidism of renal origin: Secondary | ICD-10-CM | POA: Diagnosis not present

## 2018-12-19 DIAGNOSIS — N186 End stage renal disease: Secondary | ICD-10-CM | POA: Diagnosis not present

## 2018-12-19 DIAGNOSIS — D509 Iron deficiency anemia, unspecified: Secondary | ICD-10-CM | POA: Diagnosis not present

## 2018-12-19 DIAGNOSIS — E1122 Type 2 diabetes mellitus with diabetic chronic kidney disease: Secondary | ICD-10-CM | POA: Diagnosis not present

## 2018-12-19 DIAGNOSIS — D631 Anemia in chronic kidney disease: Secondary | ICD-10-CM | POA: Diagnosis not present

## 2018-12-20 ENCOUNTER — Ambulatory Visit (INDEPENDENT_AMBULATORY_CARE_PROVIDER_SITE_OTHER): Payer: Medicare Other | Admitting: General Practice

## 2018-12-20 DIAGNOSIS — Z7901 Long term (current) use of anticoagulants: Secondary | ICD-10-CM | POA: Diagnosis not present

## 2018-12-20 NOTE — Progress Notes (Signed)
Medical treatment/procedure(s) were performed by non-physician practitioner and as supervising physician I was immediately available for consultation/collaboration. I agree with above. Honest Vanleer A Nathanie Ottley, MD  

## 2018-12-20 NOTE — Patient Instructions (Signed)
Pre visit review using our clinic review tool, if applicable. No additional management support is needed unless otherwise documented below in the visit note.  Take 1 1/2 tablets today (3/10) and then continue to take 1 tablet daily.  Re-check in 1 week. Spoke with patient and he verbalized understanding.  Dialysis patient.

## 2018-12-22 DIAGNOSIS — D631 Anemia in chronic kidney disease: Secondary | ICD-10-CM | POA: Diagnosis not present

## 2018-12-22 DIAGNOSIS — I82492 Acute embolism and thrombosis of other specified deep vein of left lower extremity: Secondary | ICD-10-CM | POA: Diagnosis not present

## 2018-12-22 DIAGNOSIS — N2581 Secondary hyperparathyroidism of renal origin: Secondary | ICD-10-CM | POA: Diagnosis not present

## 2018-12-22 DIAGNOSIS — N186 End stage renal disease: Secondary | ICD-10-CM | POA: Diagnosis not present

## 2018-12-22 DIAGNOSIS — D509 Iron deficiency anemia, unspecified: Secondary | ICD-10-CM | POA: Diagnosis not present

## 2018-12-22 DIAGNOSIS — E1122 Type 2 diabetes mellitus with diabetic chronic kidney disease: Secondary | ICD-10-CM | POA: Diagnosis not present

## 2018-12-23 LAB — PROTIME-INR: INR: 3.1 — AB (ref <=1.1)

## 2018-12-24 DIAGNOSIS — N186 End stage renal disease: Secondary | ICD-10-CM | POA: Diagnosis not present

## 2018-12-24 DIAGNOSIS — D631 Anemia in chronic kidney disease: Secondary | ICD-10-CM | POA: Diagnosis not present

## 2018-12-24 DIAGNOSIS — N2581 Secondary hyperparathyroidism of renal origin: Secondary | ICD-10-CM | POA: Diagnosis not present

## 2018-12-24 DIAGNOSIS — D509 Iron deficiency anemia, unspecified: Secondary | ICD-10-CM | POA: Diagnosis not present

## 2018-12-24 DIAGNOSIS — E1122 Type 2 diabetes mellitus with diabetic chronic kidney disease: Secondary | ICD-10-CM | POA: Diagnosis not present

## 2018-12-26 DIAGNOSIS — N2581 Secondary hyperparathyroidism of renal origin: Secondary | ICD-10-CM | POA: Diagnosis not present

## 2018-12-26 DIAGNOSIS — E1122 Type 2 diabetes mellitus with diabetic chronic kidney disease: Secondary | ICD-10-CM | POA: Diagnosis not present

## 2018-12-26 DIAGNOSIS — D509 Iron deficiency anemia, unspecified: Secondary | ICD-10-CM | POA: Diagnosis not present

## 2018-12-26 DIAGNOSIS — D631 Anemia in chronic kidney disease: Secondary | ICD-10-CM | POA: Diagnosis not present

## 2018-12-26 DIAGNOSIS — N186 End stage renal disease: Secondary | ICD-10-CM | POA: Diagnosis not present

## 2018-12-27 ENCOUNTER — Ambulatory Visit (INDEPENDENT_AMBULATORY_CARE_PROVIDER_SITE_OTHER): Payer: Medicare Other | Admitting: General Practice

## 2018-12-27 DIAGNOSIS — Z7901 Long term (current) use of anticoagulants: Secondary | ICD-10-CM | POA: Diagnosis not present

## 2018-12-27 NOTE — Progress Notes (Signed)
Medical treatment/procedure(s) were performed by non-physician practitioner and as supervising physician I was immediately available for consultation/collaboration. I agree with above. Hardeep Reetz A Sunaina Ferrando, MD  

## 2018-12-27 NOTE — Patient Instructions (Signed)
Pre visit review using our clinic review tool, if applicable. No additional management support is needed unless otherwise documented below in the visit note.  Hold coumadin today and then continue to take 1 tablet daily.  Re-check in 1 week. Spoke with patient and he verbalized understanding.  Dialysis patient.

## 2018-12-29 DIAGNOSIS — E1122 Type 2 diabetes mellitus with diabetic chronic kidney disease: Secondary | ICD-10-CM | POA: Diagnosis not present

## 2018-12-29 DIAGNOSIS — T7840XS Allergy, unspecified, sequela: Secondary | ICD-10-CM | POA: Insufficient documentation

## 2018-12-29 DIAGNOSIS — N2581 Secondary hyperparathyroidism of renal origin: Secondary | ICD-10-CM | POA: Diagnosis not present

## 2018-12-29 DIAGNOSIS — I82492 Acute embolism and thrombosis of other specified deep vein of left lower extremity: Secondary | ICD-10-CM | POA: Diagnosis not present

## 2018-12-29 DIAGNOSIS — N186 End stage renal disease: Secondary | ICD-10-CM | POA: Diagnosis not present

## 2018-12-29 DIAGNOSIS — D509 Iron deficiency anemia, unspecified: Secondary | ICD-10-CM | POA: Diagnosis not present

## 2018-12-29 DIAGNOSIS — R52 Pain, unspecified: Secondary | ICD-10-CM | POA: Insufficient documentation

## 2018-12-29 DIAGNOSIS — D631 Anemia in chronic kidney disease: Secondary | ICD-10-CM | POA: Diagnosis not present

## 2018-12-31 DIAGNOSIS — D631 Anemia in chronic kidney disease: Secondary | ICD-10-CM | POA: Diagnosis not present

## 2018-12-31 DIAGNOSIS — D509 Iron deficiency anemia, unspecified: Secondary | ICD-10-CM | POA: Diagnosis not present

## 2018-12-31 DIAGNOSIS — N2581 Secondary hyperparathyroidism of renal origin: Secondary | ICD-10-CM | POA: Diagnosis not present

## 2018-12-31 DIAGNOSIS — N186 End stage renal disease: Secondary | ICD-10-CM | POA: Diagnosis not present

## 2018-12-31 DIAGNOSIS — E1122 Type 2 diabetes mellitus with diabetic chronic kidney disease: Secondary | ICD-10-CM | POA: Diagnosis not present

## 2019-01-03 DIAGNOSIS — E1122 Type 2 diabetes mellitus with diabetic chronic kidney disease: Secondary | ICD-10-CM | POA: Diagnosis not present

## 2019-01-03 DIAGNOSIS — N186 End stage renal disease: Secondary | ICD-10-CM | POA: Diagnosis not present

## 2019-01-03 DIAGNOSIS — N2581 Secondary hyperparathyroidism of renal origin: Secondary | ICD-10-CM | POA: Diagnosis not present

## 2019-01-03 DIAGNOSIS — D509 Iron deficiency anemia, unspecified: Secondary | ICD-10-CM | POA: Diagnosis not present

## 2019-01-03 DIAGNOSIS — D631 Anemia in chronic kidney disease: Secondary | ICD-10-CM | POA: Diagnosis not present

## 2019-01-04 DIAGNOSIS — D509 Iron deficiency anemia, unspecified: Secondary | ICD-10-CM | POA: Diagnosis not present

## 2019-01-04 DIAGNOSIS — E559 Vitamin D deficiency, unspecified: Secondary | ICD-10-CM | POA: Diagnosis not present

## 2019-01-04 DIAGNOSIS — R635 Abnormal weight gain: Secondary | ICD-10-CM | POA: Diagnosis not present

## 2019-01-04 DIAGNOSIS — E1165 Type 2 diabetes mellitus with hyperglycemia: Secondary | ICD-10-CM | POA: Diagnosis not present

## 2019-01-05 DIAGNOSIS — I82492 Acute embolism and thrombosis of other specified deep vein of left lower extremity: Secondary | ICD-10-CM | POA: Diagnosis not present

## 2019-01-05 DIAGNOSIS — D509 Iron deficiency anemia, unspecified: Secondary | ICD-10-CM | POA: Diagnosis not present

## 2019-01-05 DIAGNOSIS — N186 End stage renal disease: Secondary | ICD-10-CM | POA: Diagnosis not present

## 2019-01-05 DIAGNOSIS — E1122 Type 2 diabetes mellitus with diabetic chronic kidney disease: Secondary | ICD-10-CM | POA: Diagnosis not present

## 2019-01-05 DIAGNOSIS — N2581 Secondary hyperparathyroidism of renal origin: Secondary | ICD-10-CM | POA: Diagnosis not present

## 2019-01-05 DIAGNOSIS — D631 Anemia in chronic kidney disease: Secondary | ICD-10-CM | POA: Diagnosis not present

## 2019-01-07 DIAGNOSIS — E1122 Type 2 diabetes mellitus with diabetic chronic kidney disease: Secondary | ICD-10-CM | POA: Diagnosis not present

## 2019-01-07 DIAGNOSIS — N186 End stage renal disease: Secondary | ICD-10-CM | POA: Diagnosis not present

## 2019-01-07 DIAGNOSIS — N2581 Secondary hyperparathyroidism of renal origin: Secondary | ICD-10-CM | POA: Diagnosis not present

## 2019-01-07 DIAGNOSIS — D509 Iron deficiency anemia, unspecified: Secondary | ICD-10-CM | POA: Diagnosis not present

## 2019-01-07 DIAGNOSIS — D631 Anemia in chronic kidney disease: Secondary | ICD-10-CM | POA: Diagnosis not present

## 2019-01-10 DIAGNOSIS — E1122 Type 2 diabetes mellitus with diabetic chronic kidney disease: Secondary | ICD-10-CM | POA: Diagnosis not present

## 2019-01-10 DIAGNOSIS — N2581 Secondary hyperparathyroidism of renal origin: Secondary | ICD-10-CM | POA: Diagnosis not present

## 2019-01-10 DIAGNOSIS — N186 End stage renal disease: Secondary | ICD-10-CM | POA: Diagnosis not present

## 2019-01-10 DIAGNOSIS — D509 Iron deficiency anemia, unspecified: Secondary | ICD-10-CM | POA: Diagnosis not present

## 2019-01-10 DIAGNOSIS — D631 Anemia in chronic kidney disease: Secondary | ICD-10-CM | POA: Diagnosis not present

## 2019-01-11 DIAGNOSIS — T82858A Stenosis of vascular prosthetic devices, implants and grafts, initial encounter: Secondary | ICD-10-CM | POA: Diagnosis not present

## 2019-01-11 DIAGNOSIS — N186 End stage renal disease: Secondary | ICD-10-CM | POA: Diagnosis not present

## 2019-01-11 DIAGNOSIS — I871 Compression of vein: Secondary | ICD-10-CM | POA: Diagnosis not present

## 2019-01-11 DIAGNOSIS — Z992 Dependence on renal dialysis: Secondary | ICD-10-CM | POA: Diagnosis not present

## 2019-01-11 DIAGNOSIS — E1122 Type 2 diabetes mellitus with diabetic chronic kidney disease: Secondary | ICD-10-CM | POA: Diagnosis not present

## 2019-01-12 DIAGNOSIS — E1129 Type 2 diabetes mellitus with other diabetic kidney complication: Secondary | ICD-10-CM | POA: Diagnosis not present

## 2019-01-12 DIAGNOSIS — N186 End stage renal disease: Secondary | ICD-10-CM | POA: Diagnosis not present

## 2019-01-12 DIAGNOSIS — M109 Gout, unspecified: Secondary | ICD-10-CM | POA: Diagnosis not present

## 2019-01-12 DIAGNOSIS — N2581 Secondary hyperparathyroidism of renal origin: Secondary | ICD-10-CM | POA: Diagnosis not present

## 2019-01-12 DIAGNOSIS — I82492 Acute embolism and thrombosis of other specified deep vein of left lower extremity: Secondary | ICD-10-CM | POA: Diagnosis not present

## 2019-01-12 DIAGNOSIS — E1122 Type 2 diabetes mellitus with diabetic chronic kidney disease: Secondary | ICD-10-CM | POA: Diagnosis not present

## 2019-01-13 LAB — POCT INR: INR: 2.3 (ref 2.0–3.0)

## 2019-01-14 DIAGNOSIS — N186 End stage renal disease: Secondary | ICD-10-CM | POA: Diagnosis not present

## 2019-01-14 DIAGNOSIS — N2581 Secondary hyperparathyroidism of renal origin: Secondary | ICD-10-CM | POA: Diagnosis not present

## 2019-01-14 DIAGNOSIS — Z4802 Encounter for removal of sutures: Secondary | ICD-10-CM | POA: Insufficient documentation

## 2019-01-14 DIAGNOSIS — E1122 Type 2 diabetes mellitus with diabetic chronic kidney disease: Secondary | ICD-10-CM | POA: Diagnosis not present

## 2019-01-17 ENCOUNTER — Ambulatory Visit (INDEPENDENT_AMBULATORY_CARE_PROVIDER_SITE_OTHER): Payer: Medicare Other | Admitting: General Practice

## 2019-01-17 DIAGNOSIS — N2581 Secondary hyperparathyroidism of renal origin: Secondary | ICD-10-CM | POA: Diagnosis not present

## 2019-01-17 DIAGNOSIS — Z7901 Long term (current) use of anticoagulants: Secondary | ICD-10-CM | POA: Diagnosis not present

## 2019-01-17 DIAGNOSIS — E1122 Type 2 diabetes mellitus with diabetic chronic kidney disease: Secondary | ICD-10-CM | POA: Diagnosis not present

## 2019-01-17 DIAGNOSIS — N186 End stage renal disease: Secondary | ICD-10-CM | POA: Diagnosis not present

## 2019-01-17 NOTE — Progress Notes (Signed)
Medical treatment/procedure(s) were performed by non-physician practitioner and as supervising physician I was immediately available for consultation/collaboration. I agree with above. Elizabeth A Crawford, MD  

## 2019-01-17 NOTE — Patient Instructions (Signed)
Pre visit review using our clinic review tool, if applicable. No additional management support is needed unless otherwise documented below in the visit note.  Continue to take 1 tablet daily.  Re-check in 1 week. Spoke with patient and he verbalized understanding.  Dialysis patient.

## 2019-01-19 DIAGNOSIS — N2581 Secondary hyperparathyroidism of renal origin: Secondary | ICD-10-CM | POA: Diagnosis not present

## 2019-01-19 DIAGNOSIS — E1122 Type 2 diabetes mellitus with diabetic chronic kidney disease: Secondary | ICD-10-CM | POA: Diagnosis not present

## 2019-01-19 DIAGNOSIS — N186 End stage renal disease: Secondary | ICD-10-CM | POA: Diagnosis not present

## 2019-01-19 DIAGNOSIS — I82492 Acute embolism and thrombosis of other specified deep vein of left lower extremity: Secondary | ICD-10-CM | POA: Diagnosis not present

## 2019-01-21 DIAGNOSIS — E1122 Type 2 diabetes mellitus with diabetic chronic kidney disease: Secondary | ICD-10-CM | POA: Diagnosis not present

## 2019-01-21 DIAGNOSIS — N2581 Secondary hyperparathyroidism of renal origin: Secondary | ICD-10-CM | POA: Diagnosis not present

## 2019-01-21 DIAGNOSIS — N186 End stage renal disease: Secondary | ICD-10-CM | POA: Diagnosis not present

## 2019-01-24 DIAGNOSIS — N186 End stage renal disease: Secondary | ICD-10-CM | POA: Diagnosis not present

## 2019-01-24 DIAGNOSIS — E1122 Type 2 diabetes mellitus with diabetic chronic kidney disease: Secondary | ICD-10-CM | POA: Diagnosis not present

## 2019-01-24 DIAGNOSIS — N2581 Secondary hyperparathyroidism of renal origin: Secondary | ICD-10-CM | POA: Diagnosis not present

## 2019-01-26 ENCOUNTER — Other Ambulatory Visit: Payer: Self-pay | Admitting: Internal Medicine

## 2019-01-26 DIAGNOSIS — N2581 Secondary hyperparathyroidism of renal origin: Secondary | ICD-10-CM | POA: Diagnosis not present

## 2019-01-26 DIAGNOSIS — N186 End stage renal disease: Secondary | ICD-10-CM | POA: Diagnosis not present

## 2019-01-26 DIAGNOSIS — I82492 Acute embolism and thrombosis of other specified deep vein of left lower extremity: Secondary | ICD-10-CM | POA: Diagnosis not present

## 2019-01-26 DIAGNOSIS — E1122 Type 2 diabetes mellitus with diabetic chronic kidney disease: Secondary | ICD-10-CM | POA: Diagnosis not present

## 2019-01-26 LAB — PROTIME-INR: INR: 1.5 — AB (ref <=1.1)

## 2019-01-28 DIAGNOSIS — N186 End stage renal disease: Secondary | ICD-10-CM | POA: Diagnosis not present

## 2019-01-28 DIAGNOSIS — N2581 Secondary hyperparathyroidism of renal origin: Secondary | ICD-10-CM | POA: Diagnosis not present

## 2019-01-28 DIAGNOSIS — E1122 Type 2 diabetes mellitus with diabetic chronic kidney disease: Secondary | ICD-10-CM | POA: Diagnosis not present

## 2019-01-30 ENCOUNTER — Other Ambulatory Visit: Payer: Self-pay | Admitting: *Deleted

## 2019-01-30 NOTE — Patient Outreach (Signed)
[place] [facility]) Care Management  01/30/2019  [name] [name] 12-13-1965 [id number]   Telephone screening call    Referral source: [employer]  Referral reason : Tier 5 , High risk   Chart reviewed for history : ESRD, Hemodialysis T,TH,S, Left leg DVT, Atrial flutter, Hypertension, heart failure,    Outreach call to patient , introduced self and explained reason for the call , HIPAA verified. Patient discussed doing fairly well on today.  He further discussed : Social: Patient reports living at home alone, in an apartment, states he wants to be able to move into a senior disabled  apartment but has not been able to get information on one. When asked about family, he states that he doesn't have any family  in the area.   He reports using a power wheel chair for mobility, He states that he has a walker that he can't use very well because it doesn't have the tennis balls on the back. Patient reports that he relies on friends for shopping for him, he frequently orders fast foods online, pizza he mentioned . Patient reports that he is legally blind, able to use his computer and has recently completed his masters degree online, states he can read 18 black bold font  . Patient uses transportation by [facility] and services for the blind at times.  Patient discussed that he will have a CNA to begin services for personal care on 4/23 she will provide 21/2 hours a day.   Conditions:  Patient discussed history of Kidney disease and hemodialysis on Tuesday, Thursday and Saturday, transportation by [employer].  Patient discussed having Diabetes checking blood sugar once to 3 times a day, patient discussed working on limiting portions to help with losing weight , sometimes he eats food that is not as good for him.  Patient reports that his blood sugar was 109 this morning, he declines having low blood sugar episode as symptoms reviewed steps to treating low blood . Patient unable to recall recent A1c  result , noted in [facility] 7.4 on 07/05/18 Patient reports having condition of irregular heart beat, noted ED visit 12/17 and follow up at AFib clinic for  atrial flutter, patient  takes a daily blood thinner for history of blood clot left leg.  Medications  Patient reports managing his own medications reports that he gets his medication through mail from [facility] reports getting email regarding 3 month supply being shipped. Patient reports that he also uses [facility]. Patient discussed that he takes medication from the bottle each day, keeps them lined up at his bedside. He reports taking warafin daily at 6 pm. Patient reports being able to administer insulin via pen without difficulty.  Patient denies cost concern related to medication .   Consent Explained and offered [facility] care management services for complex case management of chronic medical conditions,, patient is agreeable to [facility] care management service for management of chronic conditions that include diabetes, atrial flutter ESRD. Patient also interested in information  resources for senior apartments for disabilities.   Plan Will place referral for complex case management for care related to management of multiple chronic conditions including , Diabetes and further assessment of care needs.  Will place referral to Social worker for patient request of resource information related to senior disability apartments due to visual limits and limited mobility. .  Will send successful outreach letter.     Joylene Draft, RN, [place] Management Coordinator  [phone]- Mobile [phone]- Toll Free  Main Office

## 2019-01-31 ENCOUNTER — Ambulatory Visit (INDEPENDENT_AMBULATORY_CARE_PROVIDER_SITE_OTHER): Payer: Medicare Other | Admitting: General Practice

## 2019-01-31 DIAGNOSIS — E1122 Type 2 diabetes mellitus with diabetic chronic kidney disease: Secondary | ICD-10-CM | POA: Diagnosis not present

## 2019-01-31 DIAGNOSIS — Z7901 Long term (current) use of anticoagulants: Secondary | ICD-10-CM

## 2019-01-31 DIAGNOSIS — N2581 Secondary hyperparathyroidism of renal origin: Secondary | ICD-10-CM | POA: Diagnosis not present

## 2019-01-31 DIAGNOSIS — N186 End stage renal disease: Secondary | ICD-10-CM | POA: Diagnosis not present

## 2019-01-31 NOTE — Patient Instructions (Signed)
Pre visit review using our clinic review tool, if applicable. No additional management support is needed unless otherwise documented below in the visit note.  Take 7.5 mg today and tomorrow and then continue to take 1 tablet daily.  Re-check in 1 week. Spoke with patient and he verbalized understanding.  Dialysis patient.

## 2019-01-31 NOTE — Progress Notes (Signed)
Medical treatment/procedure(s) were performed by non-physician practitioner and as supervising physician I was immediately available for consultation/collaboration. I agree with above. Greidys Deland A Chizuko Trine, MD  

## 2019-02-01 ENCOUNTER — Other Ambulatory Visit: Payer: Self-pay

## 2019-02-01 ENCOUNTER — Other Ambulatory Visit: Payer: Self-pay | Admitting: *Deleted

## 2019-02-01 NOTE — Patient Outreach (Signed)
[place] [facility]) Care Management  02/01/2019  [name] [name] 1965-12-12 [id number]   Unsuccessful outreach to patient regarding social work referral for housing and vision resources.  Referral stated:"Patient is requesting information on senior ,diability apartments due to decreased mobility, and vision, discussed that he has tried getting information through services for the blind but nothing worked out. Please assess for additional resource needs. He is able to read 18 font bold print."  BSW left voicemail message.  Unsuccessful outreach letter mailed today by [facility], [name].  Will attempt to reach patient again within four business days.  Ronn Melena, BSW Social Worker [phone]

## 2019-02-01 NOTE — Patient Outreach (Signed)
[facility]) Care Management  02/01/2019  Jsoeph Podesta [birth year] [id number]   Referral received: 01/31/2019 Initial Outreach:  02/01/2019  Telephone Assessment  SUBJECTIVE:  RN attempted outreach call today. Unsuccessful with a HIPAA approved voice message left requesting a call back.   PLAN: Will also send outreach letter and follow up within 4 days with another outreach call.  [name], RN Care Management Coordinator [place] Office [phone]

## 2019-02-02 ENCOUNTER — Other Ambulatory Visit: Payer: Self-pay

## 2019-02-02 ENCOUNTER — Ambulatory Visit: Payer: Self-pay

## 2019-02-02 DIAGNOSIS — N2581 Secondary hyperparathyroidism of renal origin: Secondary | ICD-10-CM | POA: Diagnosis not present

## 2019-02-02 DIAGNOSIS — I82492 Acute embolism and thrombosis of other specified deep vein of left lower extremity: Secondary | ICD-10-CM | POA: Diagnosis not present

## 2019-02-02 DIAGNOSIS — N186 End stage renal disease: Secondary | ICD-10-CM | POA: Diagnosis not present

## 2019-02-02 DIAGNOSIS — E1122 Type 2 diabetes mellitus with diabetic chronic kidney disease: Secondary | ICD-10-CM | POA: Diagnosis not present

## 2019-02-02 NOTE — Patient Outreach (Signed)
[place] [facility]) Care Management  02/02/2019  Jaiveon Suppes Jan 09, 1966 [id number]   BSW received return call from patient regarding social work referral for housing resources.  Patient reports receiving Section 8 and residing in the same residence for approximately ten years.  Patient desires to relocate due to mobility and balance issues.  He has been in communication with Section 8 worker about relocation but has not yet been able to locate an appropriate option. BSW and patient discussed multiple housing options.  Patient was informed that it is very likely these options will have wait lists potentially as long as one year.  Patient verbalized understanding and requested that [place] email housing resources.  BSW emailed patient a list of "[place] for Seniors" that was created by [employer].  BSW also provided patient with link to "Affordable Housing Management".  Patient reports already searching for options on socialserve.com but was unable to locate appropriate housing.  BSW will close case when confirmation of email is received from patient and he denies the need for further assistance.  Ronn Melena, BSW Social Worker [phone]

## 2019-02-02 NOTE — Patient Outreach (Signed)
[place] [facility] [facility]) Care Management  02/02/2019  [name] [name] [birth year] [id number]   Second unsuccessful outreach to patient regarding social work referral for housing and vision resources.  Referral stated:"Patient is requesting information on senior ,diability apartments due to decreased mobility, and vision, discussed that he has tried getting information through services for the blind but nothing worked out. Please assess for additional resource needs. He is able to read 18 font bold print."  BSW left voicemail message.  Unsuccessful outreach letter mailed today by [facility], [name].  Will attempt to reach patient again within four business days.  Ronn Melena, BSW Social Worker [phone]

## 2019-02-03 LAB — PROTIME-INR: INR: 1.8 — AB (ref <=1.1)

## 2019-02-04 DIAGNOSIS — N186 End stage renal disease: Secondary | ICD-10-CM | POA: Diagnosis not present

## 2019-02-04 DIAGNOSIS — E1122 Type 2 diabetes mellitus with diabetic chronic kidney disease: Secondary | ICD-10-CM | POA: Diagnosis not present

## 2019-02-04 DIAGNOSIS — N2581 Secondary hyperparathyroidism of renal origin: Secondary | ICD-10-CM | POA: Diagnosis not present

## 2019-02-06 ENCOUNTER — Ambulatory Visit: Payer: Self-pay

## 2019-02-06 ENCOUNTER — Other Ambulatory Visit: Payer: Self-pay | Admitting: *Deleted

## 2019-02-06 ENCOUNTER — Other Ambulatory Visit: Payer: Self-pay

## 2019-02-06 NOTE — Patient Outreach (Signed)
[place] [facility]) Care Management  02/06/2019  [name] [name] December 09, 1965 [id number]   Patient confirmed receipt of requested housing resources.  BSW closing case at this time but encouraged patient to call if additional needs/questions arise.  Ronn Melena, BSW Social Worker [phone]

## 2019-02-06 NOTE — Patient Outreach (Signed)
[place] [facility] Care Management  02/06/2019  [name] [name] 1966-09-25 [id number]    Referral received 01/31/2019 2nd outreach attempt: 02/06/2019  SUBJECTIVE: RN 2nd outreach attempt unsuccessful. RN able to leave a HIPAA approved voice message requesting a call back. Note outreach letter has been sent with no response.  PLAN:  Will follow up within the next week with another outreach call and allow a total of 10 days for pt to respond.   [name], RN Care Management Coordinator [place] Office [phone]

## 2019-02-07 ENCOUNTER — Ambulatory Visit (INDEPENDENT_AMBULATORY_CARE_PROVIDER_SITE_OTHER): Payer: Medicare Other | Admitting: General Practice

## 2019-02-07 DIAGNOSIS — Z7901 Long term (current) use of anticoagulants: Secondary | ICD-10-CM

## 2019-02-07 DIAGNOSIS — N186 End stage renal disease: Secondary | ICD-10-CM | POA: Diagnosis not present

## 2019-02-07 DIAGNOSIS — E1122 Type 2 diabetes mellitus with diabetic chronic kidney disease: Secondary | ICD-10-CM | POA: Diagnosis not present

## 2019-02-07 DIAGNOSIS — N2581 Secondary hyperparathyroidism of renal origin: Secondary | ICD-10-CM | POA: Diagnosis not present

## 2019-02-07 NOTE — Progress Notes (Signed)
Medical treatment/procedure(s) were performed by non-physician practitioner and as supervising physician I was immediately available for consultation/collaboration. I agree with above. Elizabeth A Crawford, MD  

## 2019-02-07 NOTE — Patient Instructions (Signed)
Pre visit review using our clinic review tool, if applicable. No additional management support is needed unless otherwise documented below in the visit note.  Take 7.5 mg today and then continue to take 1 tablet daily.  Re-check in 1 week. Spoke with patient and he verbalized understanding.  Dialysis patient.

## 2019-02-09 DIAGNOSIS — E1122 Type 2 diabetes mellitus with diabetic chronic kidney disease: Secondary | ICD-10-CM | POA: Diagnosis not present

## 2019-02-09 DIAGNOSIS — N186 End stage renal disease: Secondary | ICD-10-CM | POA: Diagnosis not present

## 2019-02-09 DIAGNOSIS — N2581 Secondary hyperparathyroidism of renal origin: Secondary | ICD-10-CM | POA: Diagnosis not present

## 2019-02-10 ENCOUNTER — Other Ambulatory Visit: Payer: Self-pay | Admitting: *Deleted

## 2019-02-10 DIAGNOSIS — E1122 Type 2 diabetes mellitus with diabetic chronic kidney disease: Secondary | ICD-10-CM | POA: Diagnosis not present

## 2019-02-10 DIAGNOSIS — N186 End stage renal disease: Secondary | ICD-10-CM | POA: Diagnosis not present

## 2019-02-10 DIAGNOSIS — Z992 Dependence on renal dialysis: Secondary | ICD-10-CM | POA: Diagnosis not present

## 2019-02-10 NOTE — Patient Outreach (Signed)
[place] [facility]) Care Management  02/10/2019  Zahmir Lalla [birth year] [id number]    RN 3rd outreach attempt however remains unsuccessful with no response to the outreach letter sent earlier this last month. RN able to leave a HIPAA voice message requesting a call back.  Plan to close case next week if no call back for pending services with this RN case manager.  [name], RN Care Management Coordinator [place] Office [phone]

## 2019-02-11 DIAGNOSIS — E1122 Type 2 diabetes mellitus with diabetic chronic kidney disease: Secondary | ICD-10-CM | POA: Diagnosis not present

## 2019-02-11 DIAGNOSIS — N2581 Secondary hyperparathyroidism of renal origin: Secondary | ICD-10-CM | POA: Diagnosis not present

## 2019-02-11 DIAGNOSIS — N186 End stage renal disease: Secondary | ICD-10-CM | POA: Diagnosis not present

## 2019-02-11 DIAGNOSIS — D631 Anemia in chronic kidney disease: Secondary | ICD-10-CM | POA: Diagnosis not present

## 2019-02-14 DIAGNOSIS — N186 End stage renal disease: Secondary | ICD-10-CM | POA: Diagnosis not present

## 2019-02-14 DIAGNOSIS — D631 Anemia in chronic kidney disease: Secondary | ICD-10-CM | POA: Diagnosis not present

## 2019-02-14 DIAGNOSIS — N2581 Secondary hyperparathyroidism of renal origin: Secondary | ICD-10-CM | POA: Diagnosis not present

## 2019-02-14 DIAGNOSIS — D689 Coagulation defect, unspecified: Secondary | ICD-10-CM | POA: Diagnosis not present

## 2019-02-14 DIAGNOSIS — E1122 Type 2 diabetes mellitus with diabetic chronic kidney disease: Secondary | ICD-10-CM | POA: Diagnosis not present

## 2019-02-15 ENCOUNTER — Other Ambulatory Visit: Payer: Self-pay | Admitting: *Deleted

## 2019-02-15 NOTE — Patient Outreach (Signed)
[place] [facility]) Care Management  02/15/2019  [name] [name] 02-Jun-1966 [id number]   Case Closure:Inactive   Several outreach attempts with messages left and outreach letter sent to pt however no response. Will closed case at this time and notify the provider. No other team member via [facility] active at this time.  [name], RN Care Management Coordinator [place] Office [phone]

## 2019-02-16 DIAGNOSIS — I82492 Acute embolism and thrombosis of other specified deep vein of left lower extremity: Secondary | ICD-10-CM | POA: Diagnosis not present

## 2019-02-16 DIAGNOSIS — N2581 Secondary hyperparathyroidism of renal origin: Secondary | ICD-10-CM | POA: Diagnosis not present

## 2019-02-16 DIAGNOSIS — D631 Anemia in chronic kidney disease: Secondary | ICD-10-CM | POA: Diagnosis not present

## 2019-02-16 DIAGNOSIS — E1122 Type 2 diabetes mellitus with diabetic chronic kidney disease: Secondary | ICD-10-CM | POA: Diagnosis not present

## 2019-02-16 DIAGNOSIS — M109 Gout, unspecified: Secondary | ICD-10-CM | POA: Diagnosis not present

## 2019-02-16 DIAGNOSIS — N186 End stage renal disease: Secondary | ICD-10-CM | POA: Diagnosis not present

## 2019-02-16 LAB — POCT INR: INR: 2.7 — AB (ref 0.9–1.1)

## 2019-02-16 LAB — PROTIME-INR: Protime: 28.2 — AB (ref 10.0–13.8)

## 2019-02-17 DIAGNOSIS — E1165 Type 2 diabetes mellitus with hyperglycemia: Secondary | ICD-10-CM | POA: Diagnosis not present

## 2019-02-17 DIAGNOSIS — E559 Vitamin D deficiency, unspecified: Secondary | ICD-10-CM | POA: Diagnosis not present

## 2019-02-17 DIAGNOSIS — D509 Iron deficiency anemia, unspecified: Secondary | ICD-10-CM | POA: Diagnosis not present

## 2019-02-17 DIAGNOSIS — R635 Abnormal weight gain: Secondary | ICD-10-CM | POA: Diagnosis not present

## 2019-02-18 DIAGNOSIS — N2581 Secondary hyperparathyroidism of renal origin: Secondary | ICD-10-CM | POA: Diagnosis not present

## 2019-02-18 DIAGNOSIS — N186 End stage renal disease: Secondary | ICD-10-CM | POA: Diagnosis not present

## 2019-02-18 DIAGNOSIS — E1122 Type 2 diabetes mellitus with diabetic chronic kidney disease: Secondary | ICD-10-CM | POA: Diagnosis not present

## 2019-02-18 DIAGNOSIS — D631 Anemia in chronic kidney disease: Secondary | ICD-10-CM | POA: Diagnosis not present

## 2019-02-20 ENCOUNTER — Encounter: Payer: Self-pay | Admitting: Internal Medicine

## 2019-02-20 NOTE — Progress Notes (Signed)
Abstracted and sent to scan  

## 2019-02-21 DIAGNOSIS — D631 Anemia in chronic kidney disease: Secondary | ICD-10-CM | POA: Diagnosis not present

## 2019-02-21 DIAGNOSIS — E1122 Type 2 diabetes mellitus with diabetic chronic kidney disease: Secondary | ICD-10-CM | POA: Diagnosis not present

## 2019-02-21 DIAGNOSIS — N2581 Secondary hyperparathyroidism of renal origin: Secondary | ICD-10-CM | POA: Diagnosis not present

## 2019-02-21 DIAGNOSIS — N186 End stage renal disease: Secondary | ICD-10-CM | POA: Diagnosis not present

## 2019-02-23 DIAGNOSIS — I82492 Acute embolism and thrombosis of other specified deep vein of left lower extremity: Secondary | ICD-10-CM | POA: Diagnosis not present

## 2019-02-23 DIAGNOSIS — D631 Anemia in chronic kidney disease: Secondary | ICD-10-CM | POA: Diagnosis not present

## 2019-02-23 DIAGNOSIS — N2581 Secondary hyperparathyroidism of renal origin: Secondary | ICD-10-CM | POA: Diagnosis not present

## 2019-02-23 DIAGNOSIS — N186 End stage renal disease: Secondary | ICD-10-CM | POA: Diagnosis not present

## 2019-02-23 DIAGNOSIS — E1122 Type 2 diabetes mellitus with diabetic chronic kidney disease: Secondary | ICD-10-CM | POA: Diagnosis not present

## 2019-02-24 LAB — PROTIME-INR: INR: 2.5 — AB (ref <=1.1)

## 2019-02-25 DIAGNOSIS — E1122 Type 2 diabetes mellitus with diabetic chronic kidney disease: Secondary | ICD-10-CM | POA: Diagnosis not present

## 2019-02-25 DIAGNOSIS — D631 Anemia in chronic kidney disease: Secondary | ICD-10-CM | POA: Diagnosis not present

## 2019-02-25 DIAGNOSIS — N186 End stage renal disease: Secondary | ICD-10-CM | POA: Diagnosis not present

## 2019-02-25 DIAGNOSIS — N2581 Secondary hyperparathyroidism of renal origin: Secondary | ICD-10-CM | POA: Diagnosis not present

## 2019-02-28 ENCOUNTER — Ambulatory Visit (INDEPENDENT_AMBULATORY_CARE_PROVIDER_SITE_OTHER): Payer: Medicare Other | Admitting: General Practice

## 2019-02-28 DIAGNOSIS — N2581 Secondary hyperparathyroidism of renal origin: Secondary | ICD-10-CM | POA: Diagnosis not present

## 2019-02-28 DIAGNOSIS — E1122 Type 2 diabetes mellitus with diabetic chronic kidney disease: Secondary | ICD-10-CM | POA: Diagnosis not present

## 2019-02-28 DIAGNOSIS — Z7901 Long term (current) use of anticoagulants: Secondary | ICD-10-CM

## 2019-02-28 DIAGNOSIS — N186 End stage renal disease: Secondary | ICD-10-CM | POA: Diagnosis not present

## 2019-02-28 DIAGNOSIS — D631 Anemia in chronic kidney disease: Secondary | ICD-10-CM | POA: Diagnosis not present

## 2019-02-28 NOTE — Patient Instructions (Signed)
Pre visit review using our clinic review tool, if applicable. No additional management support is needed unless otherwise documented below in the visit note.  Continue to take 1 tablet daily.  Re-check in 1 week. Spoke with patient and he verbalized understanding.  Dialysis patient.

## 2019-02-28 NOTE — Progress Notes (Signed)
Medical treatment/procedure(s) were performed by non-physician practitioner and as supervising physician I was immediately available for consultation/collaboration. I agree with above. Tesean Stump A Rylei Masella, MD  

## 2019-03-02 DIAGNOSIS — E1122 Type 2 diabetes mellitus with diabetic chronic kidney disease: Secondary | ICD-10-CM | POA: Diagnosis not present

## 2019-03-02 DIAGNOSIS — I82492 Acute embolism and thrombosis of other specified deep vein of left lower extremity: Secondary | ICD-10-CM | POA: Diagnosis not present

## 2019-03-02 DIAGNOSIS — N2581 Secondary hyperparathyroidism of renal origin: Secondary | ICD-10-CM | POA: Diagnosis not present

## 2019-03-02 DIAGNOSIS — D631 Anemia in chronic kidney disease: Secondary | ICD-10-CM | POA: Diagnosis not present

## 2019-03-02 DIAGNOSIS — N186 End stage renal disease: Secondary | ICD-10-CM | POA: Diagnosis not present

## 2019-03-03 ENCOUNTER — Ambulatory Visit (INDEPENDENT_AMBULATORY_CARE_PROVIDER_SITE_OTHER): Payer: Medicare Other | Admitting: Podiatry

## 2019-03-03 ENCOUNTER — Encounter: Payer: Self-pay | Admitting: Podiatry

## 2019-03-03 ENCOUNTER — Other Ambulatory Visit: Payer: Self-pay

## 2019-03-03 ENCOUNTER — Ambulatory Visit (INDEPENDENT_AMBULATORY_CARE_PROVIDER_SITE_OTHER): Payer: Medicare Other

## 2019-03-03 VITALS — Temp 96.6°F

## 2019-03-03 DIAGNOSIS — L97512 Non-pressure chronic ulcer of other part of right foot with fat layer exposed: Secondary | ICD-10-CM | POA: Diagnosis not present

## 2019-03-03 DIAGNOSIS — L97511 Non-pressure chronic ulcer of other part of right foot limited to breakdown of skin: Secondary | ICD-10-CM | POA: Diagnosis not present

## 2019-03-03 LAB — PROTIME-INR: INR: 2.6 — AB (ref <=1.1)

## 2019-03-03 MED ORDER — SILVER SULFADIAZINE 1 % EX CREA: 1.0000 "application " | TOPICAL_CREAM | Freq: Every day | CUTANEOUS | 0 refills | Status: DC

## 2019-03-03 MED ORDER — DOXYCYCLINE HYCLATE 100 MG PO TABS: 100.0000 mg | ORAL_TABLET | Freq: Two times a day (BID) | ORAL | 0 refills | Status: DC

## 2019-03-03 NOTE — Progress Notes (Signed)
Subjective: 53 year old male presents the office today for 2-week history of a wound in the right foot pointing to the first MPJ.  He states he is not sure how it started.  He states he was watching TV and noticed it.  Is been keeping Silvadene on the area.  Denies any drainage or pus in the is identified. Denies any systemic complaints such as fevers, chills, nausea, vomiting. No acute changes since last appointment, and no other complaints at this time.   Objective: AAO x3, NAD DP/PT pulses decreased bilaterally, appears unchanged Protective sensation decreased with Simms Weinstein monofilament On the right foot first MPJ is ulceration with hyperkeratotic periwound.  After debridement the wound measured 1.5 x 0.8 x 0.2 cm and 1.2 x 0.6.  After debridement there was a granular wound base.  There is no probing, undermining or tunneling.  Mild surrounding erythema but there is no ascending cellulitis.  No fluctuation medication. Pre-ulcerative blistered area distal right third toe.  No surrounding erythema. Scab present in the left dorsal midfoot from previous wound without any signs of infection. No pain with calf compression, swelling, warmth, erythema  Assessment: Right foot ulceration, localized erythema  Plan: -All treatment options discussed with the patient including all alternatives, risks, complications.  -X-rays were obtained and reviewed.  No definitive cortical changes suggestive of osteomyelitis.  No soft tissue emphysema. -Sharply debrided the wound on the right foot utilizing #312 with scalpel down to healthy, bleeding, granular tissue. -Continue Silvadene dressing changes daily.  Prescribed today. -Doxycycline. -I have ordered home care supplies for him through [facility]  -Offloading shoe -Monitor for any clinical signs or symptoms of infection and directed to call the office immediately should any occur or go to the ER.  RTC 10 days or sooner if needed  Trula Slade DPM

## 2019-03-04 DIAGNOSIS — N2581 Secondary hyperparathyroidism of renal origin: Secondary | ICD-10-CM | POA: Diagnosis not present

## 2019-03-04 DIAGNOSIS — N186 End stage renal disease: Secondary | ICD-10-CM | POA: Diagnosis not present

## 2019-03-04 DIAGNOSIS — D631 Anemia in chronic kidney disease: Secondary | ICD-10-CM | POA: Diagnosis not present

## 2019-03-04 DIAGNOSIS — E1122 Type 2 diabetes mellitus with diabetic chronic kidney disease: Secondary | ICD-10-CM | POA: Diagnosis not present

## 2019-03-07 ENCOUNTER — Ambulatory Visit (INDEPENDENT_AMBULATORY_CARE_PROVIDER_SITE_OTHER): Payer: Medicare Other | Admitting: General Practice

## 2019-03-07 DIAGNOSIS — D631 Anemia in chronic kidney disease: Secondary | ICD-10-CM | POA: Diagnosis not present

## 2019-03-07 DIAGNOSIS — E1122 Type 2 diabetes mellitus with diabetic chronic kidney disease: Secondary | ICD-10-CM | POA: Diagnosis not present

## 2019-03-07 DIAGNOSIS — N2581 Secondary hyperparathyroidism of renal origin: Secondary | ICD-10-CM | POA: Diagnosis not present

## 2019-03-07 DIAGNOSIS — Z7901 Long term (current) use of anticoagulants: Secondary | ICD-10-CM | POA: Diagnosis not present

## 2019-03-07 DIAGNOSIS — N186 End stage renal disease: Secondary | ICD-10-CM | POA: Diagnosis not present

## 2019-03-07 NOTE — Progress Notes (Signed)
Medical treatment/procedure(s) were performed by non-physician practitioner and as supervising physician I was immediately available for consultation/collaboration. I agree with above. Elizabeth A Crawford, MD  

## 2019-03-07 NOTE — Patient Instructions (Signed)
Pre visit review using our clinic review tool, if applicable. No additional management support is needed unless otherwise documented below in the visit note.  Continue to take 1 tablet daily.  Re-check in 1 week. Spoke with patient and he verbalized understanding.  Dialysis patient.

## 2019-03-09 DIAGNOSIS — I82492 Acute embolism and thrombosis of other specified deep vein of left lower extremity: Secondary | ICD-10-CM | POA: Diagnosis not present

## 2019-03-09 DIAGNOSIS — N186 End stage renal disease: Secondary | ICD-10-CM | POA: Diagnosis not present

## 2019-03-09 DIAGNOSIS — E1122 Type 2 diabetes mellitus with diabetic chronic kidney disease: Secondary | ICD-10-CM | POA: Diagnosis not present

## 2019-03-09 DIAGNOSIS — D631 Anemia in chronic kidney disease: Secondary | ICD-10-CM | POA: Diagnosis not present

## 2019-03-09 DIAGNOSIS — N2581 Secondary hyperparathyroidism of renal origin: Secondary | ICD-10-CM | POA: Diagnosis not present

## 2019-03-10 ENCOUNTER — Telehealth: Payer: Self-pay | Admitting: Internal Medicine

## 2019-03-10 NOTE — Telephone Encounter (Signed)
Patient called and is having pain when having a bowel movement. OTC meds do not work. Please advise

## 2019-03-10 NOTE — Telephone Encounter (Signed)
Tried to contact patient to get more details about his discomfort---wanted to make sure he was not having rectal bleeding---and would have suggested possible OTC stool softeners if not already tried, no answer, left voicemail stating patient can do virtual visit next week at his convenience if ok to wait until then, if not, he could go to urgent care this weekend   I have called back, and patient answered phone this time---this is ongoing problem, he has already been seen by providers here for chronic hemorrhoids and all other medicines he has tried will not work---the rx last sent in to his pharmacy is so expensive, patient can't afford it---per dr burns, Margaret Pyle is very expensive and that's about all we have for rx---patient wanted to know if he could get pain pill to take for now--I have advised that all pcp's are gone for the day, and if he wanted to go to urgent care to see if they can provide another recommendation, he can do that, or if he decides to wait, call us back on Monday and I can get advisement from his pcp

## 2019-03-10 NOTE — Telephone Encounter (Signed)
Patient can do a doxy appointment next week if able.

## 2019-03-11 DIAGNOSIS — N186 End stage renal disease: Secondary | ICD-10-CM | POA: Diagnosis not present

## 2019-03-11 DIAGNOSIS — D631 Anemia in chronic kidney disease: Secondary | ICD-10-CM | POA: Diagnosis not present

## 2019-03-11 DIAGNOSIS — E1122 Type 2 diabetes mellitus with diabetic chronic kidney disease: Secondary | ICD-10-CM | POA: Diagnosis not present

## 2019-03-11 DIAGNOSIS — N2581 Secondary hyperparathyroidism of renal origin: Secondary | ICD-10-CM | POA: Diagnosis not present

## 2019-03-13 ENCOUNTER — Encounter: Payer: Self-pay | Admitting: Podiatry

## 2019-03-13 ENCOUNTER — Ambulatory Visit (INDEPENDENT_AMBULATORY_CARE_PROVIDER_SITE_OTHER): Payer: Medicare Other | Admitting: Podiatry

## 2019-03-13 ENCOUNTER — Other Ambulatory Visit: Payer: Self-pay

## 2019-03-13 ENCOUNTER — Other Ambulatory Visit: Payer: Self-pay | Admitting: Endocrinology

## 2019-03-13 VITALS — Temp 97.5°F

## 2019-03-13 DIAGNOSIS — L97521 Non-pressure chronic ulcer of other part of left foot limited to breakdown of skin: Secondary | ICD-10-CM

## 2019-03-13 DIAGNOSIS — E1122 Type 2 diabetes mellitus with diabetic chronic kidney disease: Secondary | ICD-10-CM | POA: Diagnosis not present

## 2019-03-13 DIAGNOSIS — L97512 Non-pressure chronic ulcer of other part of right foot with fat layer exposed: Secondary | ICD-10-CM

## 2019-03-13 DIAGNOSIS — Z992 Dependence on renal dialysis: Secondary | ICD-10-CM | POA: Diagnosis not present

## 2019-03-13 DIAGNOSIS — N186 End stage renal disease: Secondary | ICD-10-CM | POA: Diagnosis not present

## 2019-03-13 NOTE — Patient Instructions (Signed)
Finish course of antibiotics Keep silvadene on the wound daily Monitor for any signs/symptoms of infection. Call the office immediately if any occur or go directly to the emergency room. Call with any questions/concerns.

## 2019-03-14 DIAGNOSIS — E1129 Type 2 diabetes mellitus with other diabetic kidney complication: Secondary | ICD-10-CM | POA: Diagnosis not present

## 2019-03-14 DIAGNOSIS — N2581 Secondary hyperparathyroidism of renal origin: Secondary | ICD-10-CM | POA: Diagnosis not present

## 2019-03-14 DIAGNOSIS — N186 End stage renal disease: Secondary | ICD-10-CM | POA: Diagnosis not present

## 2019-03-14 DIAGNOSIS — D509 Iron deficiency anemia, unspecified: Secondary | ICD-10-CM | POA: Diagnosis not present

## 2019-03-14 DIAGNOSIS — E1122 Type 2 diabetes mellitus with diabetic chronic kidney disease: Secondary | ICD-10-CM | POA: Diagnosis not present

## 2019-03-16 DIAGNOSIS — E1129 Type 2 diabetes mellitus with other diabetic kidney complication: Secondary | ICD-10-CM | POA: Diagnosis not present

## 2019-03-16 DIAGNOSIS — E1122 Type 2 diabetes mellitus with diabetic chronic kidney disease: Secondary | ICD-10-CM | POA: Diagnosis not present

## 2019-03-16 DIAGNOSIS — I82492 Acute embolism and thrombosis of other specified deep vein of left lower extremity: Secondary | ICD-10-CM | POA: Diagnosis not present

## 2019-03-16 DIAGNOSIS — D509 Iron deficiency anemia, unspecified: Secondary | ICD-10-CM | POA: Diagnosis not present

## 2019-03-16 DIAGNOSIS — N186 End stage renal disease: Secondary | ICD-10-CM | POA: Diagnosis not present

## 2019-03-16 DIAGNOSIS — N2581 Secondary hyperparathyroidism of renal origin: Secondary | ICD-10-CM | POA: Diagnosis not present

## 2019-03-16 LAB — PROTIME-INR: INR: 1.9 — AB (ref <=1.1)

## 2019-03-17 ENCOUNTER — Other Ambulatory Visit: Payer: Self-pay

## 2019-03-17 ENCOUNTER — Telehealth: Payer: Self-pay | Admitting: Endocrinology

## 2019-03-17 MED ORDER — INSULIN REGULAR HUMAN (CONC) 500 UNIT/ML ~~LOC~~ SOPN: PEN_INJECTOR | SUBCUTANEOUS | 2 refills | Status: DC

## 2019-03-17 MED ORDER — INSULIN REGULAR HUMAN (CONC) 500 UNIT/ML ~~LOC~~ SOPN: PEN_INJECTOR | SUBCUTANEOUS | 0 refills | Status: DC

## 2019-03-17 NOTE — Telephone Encounter (Signed)
Rx sent 

## 2019-03-17 NOTE — Telephone Encounter (Signed)
MEDICATION:  Humulin 500 Kwikpen   PHARMACY:  [facility]   IS THIS A 90 DAY SUPPLY : 30 day supply   IS PATIENT OUT OF MEDICATION: yes  IF NOT; HOW MUCH IS LEFT:   LAST APPOINTMENT DATE: @6 /10/2018  NEXT APPOINTMENT DATE:@7 /03/2019  DO WE HAVE YOUR PERMISSION TO LEAVE A DETAILED MESSAGE:  OTHER COMMENTS:    **Let patient know to contact pharmacy at the end of the day to make sure medication is ready. **  ** Please notify patient to allow 48-72 hours to process**  **Encourage patient to contact the pharmacy for refills or they can request refills through [facility]**

## 2019-03-17 NOTE — Telephone Encounter (Signed)
Refill or deny? Pt has not been here since December 2019.

## 2019-03-17 NOTE — Telephone Encounter (Signed)
Refill: He has a follow-up appointment

## 2019-03-18 DIAGNOSIS — N2581 Secondary hyperparathyroidism of renal origin: Secondary | ICD-10-CM | POA: Diagnosis not present

## 2019-03-18 DIAGNOSIS — E1122 Type 2 diabetes mellitus with diabetic chronic kidney disease: Secondary | ICD-10-CM | POA: Diagnosis not present

## 2019-03-18 DIAGNOSIS — N186 End stage renal disease: Secondary | ICD-10-CM | POA: Diagnosis not present

## 2019-03-18 DIAGNOSIS — E1129 Type 2 diabetes mellitus with other diabetic kidney complication: Secondary | ICD-10-CM | POA: Diagnosis not present

## 2019-03-18 DIAGNOSIS — D509 Iron deficiency anemia, unspecified: Secondary | ICD-10-CM | POA: Diagnosis not present

## 2019-03-20 NOTE — Progress Notes (Signed)
Subjective: 53 year old male presents to the office today for follow-up evaluation of a wound on the right foot.  He states that overall he is doing better he still antibiotics.  He has been keeping Silvadene on the wound daily.  He is also noticed a small area in the left foot.  No drainage or pus or any swelling or redness. Denies any systemic complaints such as fevers, chills, nausea, vomiting. No acute changes since last appointment, and no other complaints at this time.   Objective: AAO x3, NAD DP/PT pulses decreased bilaterally, appears unchanged Protective sensation decreased with Simms Weinstein monofilament On the right foot first MPJ is ulceration with hyperkeratotic periwound.  After debridement the wound measured 1.5 x 0.4 x 0.3 cm and the pre-wound measurements were 1.5 x 0.3 x 0.2 cm. .there is no probing to bone, undermining or tunneling.  No surrounding erythema, ascending cellulitis.  No fluctuation crepitation malodor.   Small scab present on the dorsal aspect of the left foot just distal to the ankle.  No surrounding erythema, ascending cellulitis.  No fluctuation crepitation any malodor.   No pain with calf compression, swelling, warmth, erythema  Assessment: Right foot ulceration, localized erythema  Plan: -All treatment options discussed with the patient including all alternatives, risks, complications.  -I sharply debrided the wound on the right foot without any complications utilizing #786 blade scalpel down to healthy, granular tissue.  Continue Silvadene dressing changes daily.  Offloading at all times. -Left-sided stable without signs of infection.  He can use antibiotic ointment on this area. -Monitor for any clinical signs or symptoms of infection and directed to call the office immediately should any occur or go to the ER.  Return in about 2 weeks (around 03/27/2019).  Trula Slade DPM

## 2019-03-21 ENCOUNTER — Ambulatory Visit (INDEPENDENT_AMBULATORY_CARE_PROVIDER_SITE_OTHER): Payer: Medicare Other | Admitting: General Practice

## 2019-03-21 DIAGNOSIS — Z7901 Long term (current) use of anticoagulants: Secondary | ICD-10-CM | POA: Diagnosis not present

## 2019-03-21 DIAGNOSIS — E1129 Type 2 diabetes mellitus with other diabetic kidney complication: Secondary | ICD-10-CM | POA: Diagnosis not present

## 2019-03-21 DIAGNOSIS — E1122 Type 2 diabetes mellitus with diabetic chronic kidney disease: Secondary | ICD-10-CM | POA: Diagnosis not present

## 2019-03-21 DIAGNOSIS — D509 Iron deficiency anemia, unspecified: Secondary | ICD-10-CM | POA: Diagnosis not present

## 2019-03-21 DIAGNOSIS — N186 End stage renal disease: Secondary | ICD-10-CM | POA: Diagnosis not present

## 2019-03-21 DIAGNOSIS — N2581 Secondary hyperparathyroidism of renal origin: Secondary | ICD-10-CM | POA: Diagnosis not present

## 2019-03-21 NOTE — Progress Notes (Signed)
Medical treatment/procedure(s) were performed by non-physician practitioner and as supervising physician I was immediately available for consultation/collaboration. I agree with above. Harlem Thresher A Maryetta Shafer, MD  

## 2019-03-21 NOTE — Patient Instructions (Signed)
Pre visit review using our clinic review tool, if applicable. No additional management support is needed unless otherwise documented below in the visit note.  Please take 1 1/2 tablets today (6/9) and then continue to take 1 tablet daily.  Re-check in 1 week. Spoke with patient and he verbalized understanding.  Dialysis patient.

## 2019-03-23 DIAGNOSIS — E1129 Type 2 diabetes mellitus with other diabetic kidney complication: Secondary | ICD-10-CM | POA: Diagnosis not present

## 2019-03-23 DIAGNOSIS — D509 Iron deficiency anemia, unspecified: Secondary | ICD-10-CM | POA: Diagnosis not present

## 2019-03-23 DIAGNOSIS — N2581 Secondary hyperparathyroidism of renal origin: Secondary | ICD-10-CM | POA: Diagnosis not present

## 2019-03-23 DIAGNOSIS — E1122 Type 2 diabetes mellitus with diabetic chronic kidney disease: Secondary | ICD-10-CM | POA: Diagnosis not present

## 2019-03-23 DIAGNOSIS — I82492 Acute embolism and thrombosis of other specified deep vein of left lower extremity: Secondary | ICD-10-CM | POA: Diagnosis not present

## 2019-03-23 DIAGNOSIS — N186 End stage renal disease: Secondary | ICD-10-CM | POA: Diagnosis not present

## 2019-03-25 DIAGNOSIS — D509 Iron deficiency anemia, unspecified: Secondary | ICD-10-CM | POA: Diagnosis not present

## 2019-03-25 DIAGNOSIS — E1122 Type 2 diabetes mellitus with diabetic chronic kidney disease: Secondary | ICD-10-CM | POA: Diagnosis not present

## 2019-03-25 DIAGNOSIS — N186 End stage renal disease: Secondary | ICD-10-CM | POA: Diagnosis not present

## 2019-03-25 DIAGNOSIS — E1129 Type 2 diabetes mellitus with other diabetic kidney complication: Secondary | ICD-10-CM | POA: Diagnosis not present

## 2019-03-25 DIAGNOSIS — M109 Gout, unspecified: Secondary | ICD-10-CM | POA: Diagnosis not present

## 2019-03-25 DIAGNOSIS — N2581 Secondary hyperparathyroidism of renal origin: Secondary | ICD-10-CM | POA: Diagnosis not present

## 2019-03-27 ENCOUNTER — Other Ambulatory Visit: Payer: Self-pay

## 2019-03-27 ENCOUNTER — Ambulatory Visit (INDEPENDENT_AMBULATORY_CARE_PROVIDER_SITE_OTHER): Payer: Medicare Other | Admitting: Podiatry

## 2019-03-27 DIAGNOSIS — L97521 Non-pressure chronic ulcer of other part of left foot limited to breakdown of skin: Secondary | ICD-10-CM

## 2019-03-27 DIAGNOSIS — L97512 Non-pressure chronic ulcer of other part of right foot with fat layer exposed: Secondary | ICD-10-CM | POA: Diagnosis not present

## 2019-03-28 DIAGNOSIS — E1122 Type 2 diabetes mellitus with diabetic chronic kidney disease: Secondary | ICD-10-CM | POA: Diagnosis not present

## 2019-03-28 DIAGNOSIS — E1129 Type 2 diabetes mellitus with other diabetic kidney complication: Secondary | ICD-10-CM | POA: Diagnosis not present

## 2019-03-28 DIAGNOSIS — N186 End stage renal disease: Secondary | ICD-10-CM | POA: Diagnosis not present

## 2019-03-28 DIAGNOSIS — D509 Iron deficiency anemia, unspecified: Secondary | ICD-10-CM | POA: Diagnosis not present

## 2019-03-28 DIAGNOSIS — N2581 Secondary hyperparathyroidism of renal origin: Secondary | ICD-10-CM | POA: Diagnosis not present

## 2019-03-28 NOTE — Progress Notes (Signed)
Subjective: 53 year old male presents the office today for follow-up evaluation of a wound on the right foot.  He states that he thinks the wound is healed.  Is not seeing any drainage or pus and denies any swelling or redness.  He has no other open lesions today.  Denies any fevers, chills, nausea, vomiting.  No calf pain, chest pain, shortness of breath.  Objective: AAO x3, NAD DP/PT pulses decreased bilaterally, appears unchanged Protective sensation decreased with Simms Weinstein monofilament Hyperkeratotic lesion medial aspect the right first MPJ.  Upon debridement the wound appears to be healed.  There is no edema, erythema, drainage or pus there is no clinical signs of infection noted today.  The wound on the left side of healed as well.  No other open lesions identified. No pain with calf compression, swelling, warmth, erythema  Assessment: Right foot ulceration, resolved  Plan: -All treatment options discussed with the patient including all alternatives, risks, complications.  -I debrided the hyperkeratotic tissue today without any complications to reveal the wound is healed.  Continue moisturizer to the area daily.  Monitor for any reoccurrence.  Offloading at all times. -Monitor for any clinical signs or symptoms of infection and directed to call the office immediately should any occur or go to the ER.  Return in about 4 weeks (around 04/24/2019).  Trula Slade DPM

## 2019-03-30 DIAGNOSIS — N186 End stage renal disease: Secondary | ICD-10-CM | POA: Diagnosis not present

## 2019-03-30 DIAGNOSIS — I82492 Acute embolism and thrombosis of other specified deep vein of left lower extremity: Secondary | ICD-10-CM | POA: Diagnosis not present

## 2019-03-30 DIAGNOSIS — D509 Iron deficiency anemia, unspecified: Secondary | ICD-10-CM | POA: Diagnosis not present

## 2019-03-30 DIAGNOSIS — E1129 Type 2 diabetes mellitus with other diabetic kidney complication: Secondary | ICD-10-CM | POA: Diagnosis not present

## 2019-03-30 DIAGNOSIS — N2581 Secondary hyperparathyroidism of renal origin: Secondary | ICD-10-CM | POA: Diagnosis not present

## 2019-03-30 DIAGNOSIS — E1122 Type 2 diabetes mellitus with diabetic chronic kidney disease: Secondary | ICD-10-CM | POA: Diagnosis not present

## 2019-04-01 DIAGNOSIS — N2581 Secondary hyperparathyroidism of renal origin: Secondary | ICD-10-CM | POA: Diagnosis not present

## 2019-04-01 DIAGNOSIS — D509 Iron deficiency anemia, unspecified: Secondary | ICD-10-CM | POA: Diagnosis not present

## 2019-04-01 DIAGNOSIS — E1122 Type 2 diabetes mellitus with diabetic chronic kidney disease: Secondary | ICD-10-CM | POA: Diagnosis not present

## 2019-04-01 DIAGNOSIS — N186 End stage renal disease: Secondary | ICD-10-CM | POA: Diagnosis not present

## 2019-04-01 DIAGNOSIS — E1129 Type 2 diabetes mellitus with other diabetic kidney complication: Secondary | ICD-10-CM | POA: Diagnosis not present

## 2019-04-04 DIAGNOSIS — E1122 Type 2 diabetes mellitus with diabetic chronic kidney disease: Secondary | ICD-10-CM | POA: Diagnosis not present

## 2019-04-04 DIAGNOSIS — N2581 Secondary hyperparathyroidism of renal origin: Secondary | ICD-10-CM | POA: Diagnosis not present

## 2019-04-04 DIAGNOSIS — N186 End stage renal disease: Secondary | ICD-10-CM | POA: Diagnosis not present

## 2019-04-04 DIAGNOSIS — E1129 Type 2 diabetes mellitus with other diabetic kidney complication: Secondary | ICD-10-CM | POA: Diagnosis not present

## 2019-04-04 DIAGNOSIS — D509 Iron deficiency anemia, unspecified: Secondary | ICD-10-CM | POA: Diagnosis not present

## 2019-04-06 DIAGNOSIS — N2581 Secondary hyperparathyroidism of renal origin: Secondary | ICD-10-CM | POA: Diagnosis not present

## 2019-04-06 DIAGNOSIS — E1122 Type 2 diabetes mellitus with diabetic chronic kidney disease: Secondary | ICD-10-CM | POA: Diagnosis not present

## 2019-04-06 DIAGNOSIS — E1129 Type 2 diabetes mellitus with other diabetic kidney complication: Secondary | ICD-10-CM | POA: Diagnosis not present

## 2019-04-06 DIAGNOSIS — I82492 Acute embolism and thrombosis of other specified deep vein of left lower extremity: Secondary | ICD-10-CM | POA: Diagnosis not present

## 2019-04-06 DIAGNOSIS — N186 End stage renal disease: Secondary | ICD-10-CM | POA: Diagnosis not present

## 2019-04-06 DIAGNOSIS — D509 Iron deficiency anemia, unspecified: Secondary | ICD-10-CM | POA: Diagnosis not present

## 2019-04-08 DIAGNOSIS — N186 End stage renal disease: Secondary | ICD-10-CM | POA: Diagnosis not present

## 2019-04-08 DIAGNOSIS — D509 Iron deficiency anemia, unspecified: Secondary | ICD-10-CM | POA: Diagnosis not present

## 2019-04-08 DIAGNOSIS — N2581 Secondary hyperparathyroidism of renal origin: Secondary | ICD-10-CM | POA: Diagnosis not present

## 2019-04-08 DIAGNOSIS — E1129 Type 2 diabetes mellitus with other diabetic kidney complication: Secondary | ICD-10-CM | POA: Diagnosis not present

## 2019-04-08 DIAGNOSIS — E1122 Type 2 diabetes mellitus with diabetic chronic kidney disease: Secondary | ICD-10-CM | POA: Diagnosis not present

## 2019-04-11 DIAGNOSIS — N186 End stage renal disease: Secondary | ICD-10-CM | POA: Diagnosis not present

## 2019-04-11 DIAGNOSIS — E1129 Type 2 diabetes mellitus with other diabetic kidney complication: Secondary | ICD-10-CM | POA: Diagnosis not present

## 2019-04-11 DIAGNOSIS — E1122 Type 2 diabetes mellitus with diabetic chronic kidney disease: Secondary | ICD-10-CM | POA: Diagnosis not present

## 2019-04-11 DIAGNOSIS — N2581 Secondary hyperparathyroidism of renal origin: Secondary | ICD-10-CM | POA: Diagnosis not present

## 2019-04-11 DIAGNOSIS — D509 Iron deficiency anemia, unspecified: Secondary | ICD-10-CM | POA: Diagnosis not present

## 2019-04-12 DIAGNOSIS — N186 End stage renal disease: Secondary | ICD-10-CM | POA: Diagnosis not present

## 2019-04-12 DIAGNOSIS — Z992 Dependence on renal dialysis: Secondary | ICD-10-CM | POA: Diagnosis not present

## 2019-04-12 DIAGNOSIS — E1122 Type 2 diabetes mellitus with diabetic chronic kidney disease: Secondary | ICD-10-CM | POA: Diagnosis not present

## 2019-04-13 DIAGNOSIS — I82492 Acute embolism and thrombosis of other specified deep vein of left lower extremity: Secondary | ICD-10-CM | POA: Diagnosis not present

## 2019-04-13 DIAGNOSIS — D509 Iron deficiency anemia, unspecified: Secondary | ICD-10-CM | POA: Diagnosis not present

## 2019-04-13 DIAGNOSIS — N186 End stage renal disease: Secondary | ICD-10-CM | POA: Diagnosis not present

## 2019-04-13 DIAGNOSIS — N2581 Secondary hyperparathyroidism of renal origin: Secondary | ICD-10-CM | POA: Diagnosis not present

## 2019-04-13 DIAGNOSIS — D631 Anemia in chronic kidney disease: Secondary | ICD-10-CM | POA: Diagnosis not present

## 2019-04-13 DIAGNOSIS — E1122 Type 2 diabetes mellitus with diabetic chronic kidney disease: Secondary | ICD-10-CM | POA: Diagnosis not present

## 2019-04-14 LAB — PROTIME-INR: INR: 2.2 — AB (ref <=1.1)

## 2019-04-15 DIAGNOSIS — E1122 Type 2 diabetes mellitus with diabetic chronic kidney disease: Secondary | ICD-10-CM | POA: Diagnosis not present

## 2019-04-15 DIAGNOSIS — D509 Iron deficiency anemia, unspecified: Secondary | ICD-10-CM | POA: Diagnosis not present

## 2019-04-15 DIAGNOSIS — N2581 Secondary hyperparathyroidism of renal origin: Secondary | ICD-10-CM | POA: Diagnosis not present

## 2019-04-15 DIAGNOSIS — D631 Anemia in chronic kidney disease: Secondary | ICD-10-CM | POA: Diagnosis not present

## 2019-04-15 DIAGNOSIS — N186 End stage renal disease: Secondary | ICD-10-CM | POA: Diagnosis not present

## 2019-04-17 ENCOUNTER — Other Ambulatory Visit: Payer: Self-pay

## 2019-04-17 ENCOUNTER — Other Ambulatory Visit (INDEPENDENT_AMBULATORY_CARE_PROVIDER_SITE_OTHER): Payer: Medicare Other

## 2019-04-17 DIAGNOSIS — R635 Abnormal weight gain: Secondary | ICD-10-CM | POA: Diagnosis not present

## 2019-04-17 DIAGNOSIS — E782 Mixed hyperlipidemia: Secondary | ICD-10-CM

## 2019-04-17 DIAGNOSIS — D509 Iron deficiency anemia, unspecified: Secondary | ICD-10-CM | POA: Diagnosis not present

## 2019-04-17 DIAGNOSIS — Z794 Long term (current) use of insulin: Secondary | ICD-10-CM

## 2019-04-17 DIAGNOSIS — E1165 Type 2 diabetes mellitus with hyperglycemia: Secondary | ICD-10-CM

## 2019-04-17 DIAGNOSIS — E559 Vitamin D deficiency, unspecified: Secondary | ICD-10-CM | POA: Diagnosis not present

## 2019-04-17 LAB — LIPID PANEL
Cholesterol: 83 mg/dL (ref 0–200)
HDL: 29.8 mg/dL — ABNORMAL LOW (ref >39.00)
LDL Cholesterol: 34 mg/dL (ref 0–99)
NonHDL: 52.89
Total CHOL/HDL Ratio: 3
Triglycerides: 94 mg/dL (ref 0.0–149.0)
VLDL: 18.8 mg/dL (ref 0.0–40.0)

## 2019-04-17 LAB — GLUCOSE, RANDOM: Glucose, Bld: 90 mg/dL (ref 70–99)

## 2019-04-17 LAB — HEMOGLOBIN A1C: Hgb A1c MFr Bld: 7.2 % — ABNORMAL HIGH (ref 4.6–6.5)

## 2019-04-18 ENCOUNTER — Other Ambulatory Visit: Payer: Self-pay

## 2019-04-18 DIAGNOSIS — E1122 Type 2 diabetes mellitus with diabetic chronic kidney disease: Secondary | ICD-10-CM | POA: Diagnosis not present

## 2019-04-18 DIAGNOSIS — D509 Iron deficiency anemia, unspecified: Secondary | ICD-10-CM | POA: Diagnosis not present

## 2019-04-18 DIAGNOSIS — D631 Anemia in chronic kidney disease: Secondary | ICD-10-CM | POA: Diagnosis not present

## 2019-04-18 DIAGNOSIS — N2581 Secondary hyperparathyroidism of renal origin: Secondary | ICD-10-CM | POA: Diagnosis not present

## 2019-04-18 DIAGNOSIS — N186 End stage renal disease: Secondary | ICD-10-CM | POA: Diagnosis not present

## 2019-04-18 MED ORDER — ALLOPURINOL 100 MG PO TABS: 100.0000 mg | ORAL_TABLET | ORAL | 6 refills | Status: DC

## 2019-04-20 ENCOUNTER — Other Ambulatory Visit: Payer: Self-pay

## 2019-04-20 ENCOUNTER — Encounter: Payer: Self-pay | Admitting: Internal Medicine

## 2019-04-20 ENCOUNTER — Ambulatory Visit (INDEPENDENT_AMBULATORY_CARE_PROVIDER_SITE_OTHER): Payer: Medicare Other | Admitting: Internal Medicine

## 2019-04-20 VITALS — Ht 70.0 in | Wt 293.0 lb

## 2019-04-20 DIAGNOSIS — K6289 Other specified diseases of anus and rectum: Secondary | ICD-10-CM

## 2019-04-20 DIAGNOSIS — R109 Unspecified abdominal pain: Secondary | ICD-10-CM | POA: Insufficient documentation

## 2019-04-20 DIAGNOSIS — I82492 Acute embolism and thrombosis of other specified deep vein of left lower extremity: Secondary | ICD-10-CM | POA: Diagnosis not present

## 2019-04-20 DIAGNOSIS — R197 Diarrhea, unspecified: Secondary | ICD-10-CM

## 2019-04-20 DIAGNOSIS — K819 Cholecystitis, unspecified: Secondary | ICD-10-CM | POA: Insufficient documentation

## 2019-04-20 DIAGNOSIS — E1129 Type 2 diabetes mellitus with other diabetic kidney complication: Secondary | ICD-10-CM | POA: Diagnosis not present

## 2019-04-20 DIAGNOSIS — N186 End stage renal disease: Secondary | ICD-10-CM | POA: Diagnosis not present

## 2019-04-20 DIAGNOSIS — E1122 Type 2 diabetes mellitus with diabetic chronic kidney disease: Secondary | ICD-10-CM | POA: Diagnosis not present

## 2019-04-20 DIAGNOSIS — D509 Iron deficiency anemia, unspecified: Secondary | ICD-10-CM | POA: Diagnosis not present

## 2019-04-20 DIAGNOSIS — M109 Gout, unspecified: Secondary | ICD-10-CM | POA: Diagnosis not present

## 2019-04-20 DIAGNOSIS — N2581 Secondary hyperparathyroidism of renal origin: Secondary | ICD-10-CM | POA: Diagnosis not present

## 2019-04-20 DIAGNOSIS — D631 Anemia in chronic kidney disease: Secondary | ICD-10-CM | POA: Diagnosis not present

## 2019-04-20 NOTE — Assessment & Plan Note (Signed)
This requires an in person exam. He is not able to come tomorrow - I offered. Will arrange for next available. He prefers that I see him rather than APP.

## 2019-04-20 NOTE — Progress Notes (Signed)
TELEHEALTH ENCOUNTER IN SETTING OF COVID-19 PANDEMIC - REQUESTED BY PATIENT SERVICE PROVIDED BY TELEMEDECINE - TYPE: Doximity AV PATIENT LOCATION: Home PATIENT HAS CONSENTED TO TELEHEALTH VISIT PROVIDER LOCATION: OFFICE REFERRING PROVIDER: self PARTICIPANTS OTHER THAN PATIENT:None TIME SPENT ON CALL:11 mins 22 sec    Adryen Standley 53 y.o. [birth year] [id number]  Assessment & Plan:   Encounter Diagnoses  Name Primary?  . Rectal pain Yes  . Diarrhea, unspecified type     He needs an in office evaluation - we will schedule that and go from there.  Subjective:   Chief Complaint: rectal pain  HPI 53 yo [name] man last seen 2011 - having diarrhea after meals. Gassy also.  Painful in rectum - cannot sit in chair for dialysis. No sig pain with defecation. ? Hemorrhoids.  Talked to Dr. Sharlet Salina and rx [facility] cream for rectal pain. To prescribe suppository but too costly.  No rectal bleeding or discharge. No abdominal pain.  Flex sig 2011 - soild stool but was having diarrhea EGD 2011 - retained food   Dialyisis T R Sat Allergies  Allergen Reactions  . Pork-Derived Products Anaphylaxis and Other (See Comments)    Fever also   . Ace Inhibitors Cough  . Hydrocodone Other (See Comments)    Hallucinations   Current Meds  Medication Sig  . allopurinol (ZYLOPRIM) 100 MG tablet Take 1 tablet (100 mg total) by mouth 3 (three) times a week. After dialysis  . calcium acetate (PHOSLO) 667 MG capsule Take 3 capsules (2,001 mg total) by mouth 3 (three) times daily with meals.  . clonazePAM (KLONOPIN) 0.5 MG tablet   . doxycycline (VIBRA-TABS) 100 MG tablet Take 1 tablet (100 mg total) by mouth 2 (two) times daily.  . DROPLET PEN NEEDLES 32G X 4 MM MISC USE  TO INJECT FIVE TIMES DAILY AS DIRECTED  . glucose blood (ACCU-CHEK GUIDE) test strip 1 each by Other route as needed for other. Use as instructed to check blood sugar 4 times daily. DX:E11.65  . glucose blood (ACCU-CHEK  SMARTVIEW) test strip Use to test 4 times daily DX code E11.29  . hydrocortisone-pramoxine (PROCTOFOAM-HC) rectal foam Place 1 applicator rectally 3 (three) times daily. Prn hemorrhoids  . Insulin Glargine (TOUJEO SOLOSTAR) 300 UNIT/ML SOPN Inject 70-80 Units into the skin 2 (two) times daily. Inject 80 units in the morning and inject 70 units in the evening.  . Insulin Pen Needle 32G X 8 MM MISC Use as directed  . insulin regular human CONCENTRATED (HUMULIN R U-500 KWIKPEN) 500 UNIT/ML kwikpen INJECT SUBCUTANEOUSLY 15 UNITS AT BREAKFAST, 25 UNITS AT LUNCH, AND 25 TO 30 UNITS BEFORE SUPPER  . midodrine (PROAMATINE) 10 MG tablet Take 10 mg by mouth as needed (low blood pressure).   Marland Kitchen omeprazole (PRILOSEC) 20 MG capsule TAKE 1 CAPSULE EVERY DAY  . ONE TOUCH LANCETS MISC Use to test sugar 4 times daily  . OZEMPIC, 0.25 OR 0.5 MG/DOSE, 2 MG/1.5ML SOPN   . RENVELA 800 MG tablet Take 2,400 mg by mouth 3 (three) times daily with meals.   . rosuvastatin (CRESTOR) 20 MG tablet TAKE 1 TABLET AT BEDTIME  . silver sulfADIAZINE (SILVADENE) 1 % cream Apply 1 application topically daily.  . Suvorexant (BELSOMRA) 10 MG TABS Take 10 mg by mouth at bedtime.  . topiramate (TOPAMAX) 100 MG tablet   . triamcinolone cream (KENALOG) 0.1 % Apply 1 application topically 2 (two) times daily.  . VELTASSA 8.4 g packet Take 8.4 g by mouth  daily.   . warfarin (COUMADIN) 5 MG tablet Take 1 (5 mg) tablet every day except for Wednesday & Saturday when you take 7.36m (1.5 tablets) OR As directed.   Past Medical History:  Diagnosis Date  . Allergic rhinitis   . Anemia   . Aortic insufficiency    a. Echo 7/16:  Mod LVH, EF 50-55%, mild to mod AI, severe LAE, PASP 57 mmHg (LV ID end diastolic 560.6mm);  b. Echo 2/17:  Mod LVH, EF 50-55%, mod AI, MAC, mild MR, mod LAE, mild to mod TR, PASP 63 mmHg  . Bilateral carpal tunnel syndrome   . CHF (congestive heart failure) ([place]   . Claustrophobia   . COPD (chronic obstructive  pulmonary disease) ([place]   . Diabetes mellitus    Type II  . Diabetic peripheral neuropathy ([place]   . Diabetic retinopathy ([place]   . Discitis of lumbar region    resolved  . DVT (deep venous thrombosis) ([place] 10/07/2015   LLE  . Endocarditis 11/28/14  . ESRD (end stage renal disease) on dialysis ([facility]    s/p renal transplant in 2009, failed in 2016  . Gait disorder   . Gastroparesis   . GERD (gastroesophageal reflux disease)   . Gout   . Hearing difficulty   . Hyperlipidemia   . Hypertension   . Incomplete bladder emptying   . Leg pain 02/05/11   with walking  . Morbid obesity ([place]   . Obstructive sleep apnea    not using CPAP- lost 100lbs  . Osteomyelitis ([place]   . Posttraumatic stress disorder   . Rotator cuff disorder   . Secondary hyperparathyroidism ([place]   . Sepsis ([place]    Postive blood cultures -   . Urethral stricture   . Vitamin D deficiency    Past Surgical History:  Procedure Laterality Date  . ARTERIOVENOUS GRAFT PLACEMENT Bilateral "several"  . AV FISTULA PLACEMENT Bilateral 2015  . BASCILIC VEIN TRANSPOSITION Right 09/27/2015  . [place]   Procedure: BASILIC VEIN TRANSPOSITION right;  Surgeon: JMal Misty MD;  [place]  Service: Vascular;  Laterality: Right;  . CATARACT EXTRACTION W/ INTRAOCULAR LENS  IMPLANT, BILATERAL Bilateral   . CYSTOSCOPY W/ INTERNAL URETHROTOMY  09/2014  . KIDNEY TRANSPLANT  2009   Social History   Social History Narrative   Patient is Divorced.   Patient does not have any children.   Patient is right-handed   Patient is currently in college working on his Ph.D.      family history includes Diabetes in his father; Hypertension in his mother.   Review of Systems As per HPI

## 2019-04-20 NOTE — Patient Instructions (Addendum)
I am sorry you having the rectal pain.  As we discussed I cannot really properly evaluate this over the phone.  We will arrange for you to have an in office appointment on July 23 at 4:15 PM.  If you do not think you can wait that long we can try to have you evaluated by 1 of our physician assistants or nurse practitioners before then.  I appreciate the opportunity to care for you. Gatha Mayer, MD, [facility]

## 2019-04-21 ENCOUNTER — Ambulatory Visit (INDEPENDENT_AMBULATORY_CARE_PROVIDER_SITE_OTHER): Payer: Medicare Other | Admitting: General Practice

## 2019-04-21 ENCOUNTER — Encounter: Payer: Medicare Other | Admitting: Endocrinology

## 2019-04-21 ENCOUNTER — Other Ambulatory Visit: Payer: Self-pay

## 2019-04-21 DIAGNOSIS — Z7901 Long term (current) use of anticoagulants: Secondary | ICD-10-CM | POA: Diagnosis not present

## 2019-04-21 NOTE — Progress Notes (Signed)
This encounter was created in error - please disregard.

## 2019-04-21 NOTE — Telephone Encounter (Signed)
Left message to call me back and let me know if he wants to come in on 05/04/2019 at 4:15pm for Dr Carlean Purl to exam him.

## 2019-04-21 NOTE — Patient Instructions (Signed)
Pre visit review using our clinic review tool, if applicable. No additional management support is needed unless otherwise documented below in the visit note.  Received fax on 7/10.  Continue to take 1 tablet daily.  Re-check in 1 week. Spoke with patient and he verbalized understanding.  Dialysis patient.

## 2019-04-22 DIAGNOSIS — N186 End stage renal disease: Secondary | ICD-10-CM | POA: Diagnosis not present

## 2019-04-22 DIAGNOSIS — I82492 Acute embolism and thrombosis of other specified deep vein of left lower extremity: Secondary | ICD-10-CM | POA: Diagnosis not present

## 2019-04-22 DIAGNOSIS — D509 Iron deficiency anemia, unspecified: Secondary | ICD-10-CM | POA: Diagnosis not present

## 2019-04-22 DIAGNOSIS — N2581 Secondary hyperparathyroidism of renal origin: Secondary | ICD-10-CM | POA: Diagnosis not present

## 2019-04-22 DIAGNOSIS — D631 Anemia in chronic kidney disease: Secondary | ICD-10-CM | POA: Diagnosis not present

## 2019-04-22 DIAGNOSIS — E1122 Type 2 diabetes mellitus with diabetic chronic kidney disease: Secondary | ICD-10-CM | POA: Diagnosis not present

## 2019-04-24 ENCOUNTER — Encounter: Payer: Self-pay | Admitting: Endocrinology

## 2019-04-24 ENCOUNTER — Ambulatory Visit (INDEPENDENT_AMBULATORY_CARE_PROVIDER_SITE_OTHER): Payer: Medicare Other | Admitting: Endocrinology

## 2019-04-24 ENCOUNTER — Other Ambulatory Visit: Payer: Self-pay

## 2019-04-24 VITALS — BP 130/68 | HR 96 | Temp 98.7°F | Resp 16 | Wt 303.0 lb

## 2019-04-24 DIAGNOSIS — E1165 Type 2 diabetes mellitus with hyperglycemia: Secondary | ICD-10-CM | POA: Diagnosis not present

## 2019-04-24 DIAGNOSIS — Z794 Long term (current) use of insulin: Secondary | ICD-10-CM

## 2019-04-24 DIAGNOSIS — E782 Mixed hyperlipidemia: Secondary | ICD-10-CM | POA: Diagnosis not present

## 2019-04-24 MED ORDER — HUMULIN R U-500 KWIKPEN 500 UNIT/ML ~~LOC~~ SOPN: PEN_INJECTOR | SUBCUTANEOUS | 0 refills | Status: DC

## 2019-04-24 NOTE — Patient Instructions (Signed)
Please try to check your sugars regularly at LUNCH and dinnertime before eating and sometimes 2 hours after eating  HUMULIN U-500 insulin doses:  This needs to be taken about 30 minutes before your planned meal  Take 50 units before breakfast, 70 before lunch and 40 before dinnertime for now  If you eating more carbohydrates like rice you can add another 10 to 15 units If you are eating a small meal and less carbohydrates you can reduce the dose by 15 units also  Take a consistent dose of 80 units twice a day of the [employer]

## 2019-04-24 NOTE — Progress Notes (Signed)
Patient ID: [name] [name], male   DOB: January 18, 1966, 53 y.o.   MRN: [id number]           Reason for Appointment: Follow-up for Type 2 Diabetes  History of Present Illness:          Date of diagnosis of type 2 diabetes mellitus : 1990       Background history:  He has been taking insulin since about 1999, previously was on unknown oral agents He had good control with insulin initially but A1c was much higher in 2008 and subsequently has had required larger doses of insulin He has been on Lantus for the last few years along with Novolog,followed by an endocrinologist since 2008 His A1c was 6.3 in 04/2014   Prior to his consultation he was on a regimen of Novolog , 50 units 5 times a day with each meal and snack along with 90 Units 3 times a day of Lantus   Recent history:   INSULIN regimen is :TOUJEO 80 units twice daily  Humulin R U-500 insulin with KwikPen 50 units before eating  Previous Sliding scale use for high sugars: Sugar 150-199 take 2 units, 200-250= 4 units and over 250 take 6 units   Most recently A1C is 7.2, previously 7.4 done in 2019   He has not been seen in follow-up since 12/19  Current blood sugar patterns and problems identified:   He has had very erratic blood sugars over the last month or so  He was having hypoglycemia overnight last month for about 6 days every night and he thinks this is from eating less  However more recently has overnight blood sugars are sometimes significantly high  He has only occasional readings done in the afternoon and before dinner which are mostly high  Blood sugars after evening meal are quite variable and difficult to get a pattern including some hypoglycemia after 9 PM  He says that his diet has been quite variable and sometimes with eating outside food he may not be watching the types of carbohydrates or high fat meals or portions  Difficult to judge his weight since it fluctuates based on his fluid status  Does not  always take his insulin 30-minute before eating  Also not clear if he is taking his insulin recently consistently because of running low on his supply  Even with reminding him to check blood sugars consistently throughout the day he is mostly doing blood sugars morning and bedtime    Compliance with the medical regimen: Inconsistent   Glucose monitoring:  done 0-2 times a day         Glucometer: Accu-Chek       Blood Glucose readings by download:    PRE-MEAL Fasting Lunch  late afternoon  overnight Overall  Glucose range:  121-316 ?   209-394  45-321  41-491  Mean/median:  160    122  155   POST-MEAL PC Breakfast PC Lunch PC Dinner  Glucose range:    43-191  Mean/median:    207      Self-care: The diet that the patient has been following is: tries to limit high-fat meals, usually eating  2-3 meals a day.     Typical meal intake: Breakfast is grits with pecans, olive oil. Dinner 5-6 pm                Dietician visit, most recent:2013               Exercise:  unable to do any, In wheelchair  Weight history:  Wt Readings from Last 3 Encounters:  04/24/19 (!) 303 lb (137.4 kg)  04/20/19 293 lb (132.9 kg)  11/07/18 (!) 312 lb (141.5 kg)    Glycemic control:   Lab Results  Component Value Date   HGBA1C 7.2 (H) 04/17/2019   HGBA1C 7.4 (A) 07/05/2018   HGBA1C 7.9 (H) 10/20/2017   Lab Results  Component Value Date   MICROALBUR 232.9 (H) 06/19/2015   LDLCALC 34 04/17/2019   CREATININE 6.15 (H) 09/27/2018    Lab Results  Component Value Date   FRUCTOSAMINE 352 (H) 09/12/2018   FRUCTOSAMINE 408 (H) 07/23/2017   FRUCTOSAMINE 408 (H) 05/19/2017      Allergies as of 04/24/2019      Reactions   Pork-derived Products Anaphylaxis, Other (See Comments)   Fever also    Ace Inhibitors Cough   Hydrocodone Other (See Comments)   Hallucinations      Medication List       Accurate as of April 24, 2019  9:22 PM. If you have any questions, ask your nurse or doctor.         allopurinol 100 MG tablet Commonly known as: ZYLOPRIM Take 1 tablet (100 mg total) by mouth 3 (three) times a week. After dialysis   calcium acetate 667 MG capsule Commonly known as: PHOSLO Take 3 capsules (2,001 mg total) by mouth 3 (three) times daily with meals.   clonazePAM 0.5 MG tablet Commonly known as: KLONOPIN   doxycycline 100 MG tablet Commonly known as: VIBRA-TABS Take 1 tablet (100 mg total) by mouth 2 (two) times daily.   glucose blood test strip Commonly known as: Accu-Chek SmartView Use to test 4 times daily DX code E11.29   glucose blood test strip Commonly known as: Accu-Chek Guide 1 each by Other route as needed for other. Use as instructed to check blood sugar 4 times daily. DX:E11.65   HumuLIN R U-500 KwikPen 500 UNIT/ML kwikpen Generic drug: insulin regular human CONCENTRATED INJECT SUBCUTANEOUSLY 50 UNITS BEFORE BREAKFAST, 70 UNITS FOR LUNCH, AND 40 UNITS FOR SUPPER.  Inject 30 minutes before eating What changed: additional instructions Changed by: Elayne Snare, MD   hydrocortisone-pramoxine rectal foam Commonly known as: PROCTOFOAM-HC Place 1 applicator rectally 3 (three) times daily. Prn hemorrhoids   Insulin Glargine 300 UNIT/ML Sopn Commonly known as: Toujeo SoloStar Inject 70-80 Units into the skin 2 (two) times daily. Inject 80 units in the morning and inject 70 units in the evening.   Insulin Pen Needle 32G X 8 MM Misc Use as directed   Droplet Pen Needles 32G X 4 MM Misc Generic drug: Insulin Pen Needle USE  TO INJECT FIVE TIMES DAILY AS DIRECTED   midodrine 10 MG tablet Commonly known as: PROAMATINE Take 10 mg by mouth as needed (low blood pressure).   omeprazole 20 MG capsule Commonly known as: PRILOSEC TAKE 1 CAPSULE EVERY DAY   ONE TOUCH LANCETS Misc Use to test sugar 4 times daily   Ozempic (0.25 or 0.5 MG/DOSE) 2 MG/1.5ML Sopn Generic drug: Semaglutide(0.25 or 0.5MG/DOS)   Renvela 800 MG tablet Generic drug:  sevelamer carbonate Take 2,400 mg by mouth 3 (three) times daily with meals.   rosuvastatin 20 MG tablet Commonly known as: CRESTOR TAKE 1 TABLET AT BEDTIME   silver sulfADIAZINE 1 % cream Commonly known as: Silvadene Apply 1 application topically daily.   Suvorexant 10 MG Tabs Commonly known as: Belsomra Take 10 mg by mouth at  bedtime.   topiramate 100 MG tablet Commonly known as: TOPAMAX   triamcinolone cream 0.1 % Commonly known as: KENALOG Apply 1 application topically 2 (two) times daily.   Veltassa 8.4 g packet Generic drug: patiromer Take 8.4 g by mouth daily.   warfarin 5 MG tablet Commonly known as: COUMADIN Take as directed by the anticoagulation clinic. If you are unsure how to take this medication, talk to your nurse or doctor. Original instructions: Take 1 (5 mg) tablet every day except for Wednesday & Saturday when you take 7.10m (1.5 tablets) OR As directed.       Allergies:  Allergies  Allergen Reactions  . Pork-Derived Products Anaphylaxis and Other (See Comments)    Fever also   . Ace Inhibitors Cough  . Hydrocodone Other (See Comments)    Hallucinations    Past Medical History:  Diagnosis Date  . Allergic rhinitis   . Anemia   . Aortic insufficiency    a. Echo 7/16:  Mod LVH, EF 50-55%, mild to mod AI, severe LAE, PASP 57 mmHg (LV ID end diastolic 583.4mm);  b. Echo 2/17:  Mod LVH, EF 50-55%, mod AI, MAC, mild MR, mod LAE, mild to mod TR, PASP 63 mmHg  . Bilateral carpal tunnel syndrome   . CHF (congestive heart failure) ([place]   . Claustrophobia   . COPD (chronic obstructive pulmonary disease) ([facility]   . Diabetes mellitus    Type II  . Diabetic peripheral neuropathy ([facility]   . Diabetic retinopathy ([facility]   . Discitis of lumbar region    resolved  . DVT (deep venous thrombosis) ([facility] 10/07/2015   LLE  . Endocarditis 11/28/14  . ESRD (end stage renal disease) on dialysis ([facility]    s/p renal transplant in 2009, failed in 2016  . Gait  disorder   . Gastroparesis   . GERD (gastroesophageal reflux disease)   . Gout   . Hearing difficulty   . Hyperlipidemia   . Hypertension   . Incomplete bladder emptying   . Leg pain 02/05/11   with walking  . Morbid obesity ([facility]   . Obstructive sleep apnea    not using CPAP- lost 100lbs  . Osteomyelitis ([facility] [place]   . Posttraumatic stress disorder   . Rotator cuff disorder   . Secondary hyperparathyroidism ([facility]   . Sepsis ([facility]    Postive blood cultures -   . Urethral stricture   . Vitamin D deficiency     Past Surgical History:  Procedure Laterality Date  . ARTERIOVENOUS GRAFT PLACEMENT Bilateral "several"  . AV FISTULA PLACEMENT Bilateral 2015  . BASCILIC VEIN TRANSPOSITION Right 09/27/2015  . [place] 09/27/2015   Procedure: BASILIC VEIN TRANSPOSITION right;  Surgeon: JMal Misty MD;  [place]  Service: Vascular;  Laterality: Right;  . CATARACT EXTRACTION W/ INTRAOCULAR LENS  IMPLANT, BILATERAL Bilateral   . CYSTOSCOPY W/ INTERNAL URETHROTOMY  09/2014  . KIDNEY TRANSPLANT  2009    Family History  Problem Relation Age of Onset  . Hypertension Mother   . Diabetes Father     Social History:  reports that he has never smoked. He has never used smokeless tobacco. He reports that he does not drink alcohol or use drugs.    Review of Systems    ROS   Lipid history: He is on 20 mg Crestor With the following results    Lab Results  Component Value Date   CHOL 83 04/17/2019   HDL  29.80 (L) 04/17/2019   LDLCALC 34 04/17/2019   TRIG 94.0 04/17/2019   CHOLHDL 3 04/17/2019            Hypertension: His blood pressure is managed by nephrologist and PCP   RENAL: He has had a renal transplant in 2009 and  he is on dialysis  Neurological: Has has had  decreased sensation in his feet, also complaining of tingling and burning Previously followed by neurologist   He has had marked decrease in his balance and also some weakness in  his legs, using a power scooter  Last foot exam was in 02/2017: Absent monofilament sensation in his feet and toes and extending up to the middle of the lower leg on the left and up to the ankle on the right  LABS:  Anti-coag visit on 04/21/2019  Component Date Value Ref Range Status  . INR 04/14/2019 2.2* 0.9 - 1.1 Final   INR resulted by [facility] labs    Physical Examination:  BP 130/68   Pulse 96   Temp 98.7 F (37.1 C) (Oral)   Resp 16   Wt (!) 303 lb (137.4 kg)   SpO2 94%   BMI 43.48 kg/m      ASSESSMENT:  Diabetes type 2, long-standing with obesity See history of present illness for detailed discussion of his current management, blood sugar patterns and problems identified.  His A1c was previously 7.4, now 7.2 However previously fructosamine is 352 which is relatively more accurate in his case  Although his average blood sugar is only 155 recently his blood sugar ranges between 41 and 491 with marked variability at all times His blood sugar is usually not as variable but likely is having very significant fluctuation in his carbohydrate intake, going off diet with food cooked outside Blood sugar monitoring has been inconsistent also and not able to get a consistent pattern for postprandial readings  LIPIDS: Much better controlled now and likely he is more compliant with his Crestor   PLAN:   He will start checking his blood sugars as directed up to 4 times a day  He will need to adjust his insulin based on his portion size and carbohydrates and guidelines given  Since he appears to be needing more mealtime insulin he can go up to 70 units at lunchtime to cover afternoon hyperglycemia which is more consistent  Also to avoid tendency to low sugars late night and overnight will cut down his suppertime dose to 40 units as a base amount  He will not adjust the Toujeo unless morning sugars consistently high or low  Discussed again timing of insulin and actions of  both basal and bolus insulin  Consider follow-up with dietitian  Will review his blood sugar control in about 6 weeks  Patient Instructions  Please try to check your sugars regularly at LUNCH and dinnertime before eating and sometimes 2 hours after eating  HUMULIN U-500 insulin doses:  This needs to be taken about 30 minutes before your planned meal  Take 50 units before breakfast, 70 before lunch and 40 before dinnertime for now  If you eating more carbohydrates like rice you can add another 10 to 15 units If you are eating a small meal and less carbohydrates you can reduce the dose by 15 units also  Take a consistent dose of 80 units twice a day of the Toujeo      Counseling time on subjects discussed in assessment and plan sections is over 50% of  today's 25 minute visit    Elayne Snare 04/24/2019, 9:22 PM   Note: This office note was prepared with Dragon voice recognition system technology. Any transcriptional errors that result from this process are unintentional.

## 2019-04-25 DIAGNOSIS — D509 Iron deficiency anemia, unspecified: Secondary | ICD-10-CM | POA: Diagnosis not present

## 2019-04-25 DIAGNOSIS — E1122 Type 2 diabetes mellitus with diabetic chronic kidney disease: Secondary | ICD-10-CM | POA: Diagnosis not present

## 2019-04-25 DIAGNOSIS — N186 End stage renal disease: Secondary | ICD-10-CM | POA: Diagnosis not present

## 2019-04-25 DIAGNOSIS — D631 Anemia in chronic kidney disease: Secondary | ICD-10-CM | POA: Diagnosis not present

## 2019-04-25 DIAGNOSIS — N2581 Secondary hyperparathyroidism of renal origin: Secondary | ICD-10-CM | POA: Diagnosis not present

## 2019-04-26 ENCOUNTER — Ambulatory Visit: Payer: Medicare Other | Admitting: Podiatry

## 2019-04-26 NOTE — Telephone Encounter (Signed)
Left him another message to call me back. 

## 2019-04-27 ENCOUNTER — Ambulatory Visit (INDEPENDENT_AMBULATORY_CARE_PROVIDER_SITE_OTHER): Payer: Medicare Other | Admitting: General Practice

## 2019-04-27 ENCOUNTER — Telehealth: Payer: Self-pay | Admitting: Internal Medicine

## 2019-04-27 DIAGNOSIS — I82492 Acute embolism and thrombosis of other specified deep vein of left lower extremity: Secondary | ICD-10-CM | POA: Diagnosis not present

## 2019-04-27 DIAGNOSIS — Z7901 Long term (current) use of anticoagulants: Secondary | ICD-10-CM

## 2019-04-27 DIAGNOSIS — E1122 Type 2 diabetes mellitus with diabetic chronic kidney disease: Secondary | ICD-10-CM | POA: Diagnosis not present

## 2019-04-27 DIAGNOSIS — N186 End stage renal disease: Secondary | ICD-10-CM | POA: Diagnosis not present

## 2019-04-27 DIAGNOSIS — D509 Iron deficiency anemia, unspecified: Secondary | ICD-10-CM | POA: Diagnosis not present

## 2019-04-27 DIAGNOSIS — D631 Anemia in chronic kidney disease: Secondary | ICD-10-CM | POA: Diagnosis not present

## 2019-04-27 DIAGNOSIS — N2581 Secondary hyperparathyroidism of renal origin: Secondary | ICD-10-CM | POA: Diagnosis not present

## 2019-04-27 LAB — POCT INR: INR: 3.3 — AB (ref 2.0–3.0)

## 2019-04-27 LAB — PROTIME-INR: INR: 2.8 — AB (ref <=1.1)

## 2019-04-27 NOTE — Patient Instructions (Addendum)
Pre visit review using our clinic review tool, if applicable. No additional management support is needed unless otherwise documented below in the visit note.  Received fax on 7/16.  Hold dosage today and then continue to take 1 tablet daily. Dosing instructions left on VM.   Re-check in 1 week.

## 2019-04-27 NOTE — Telephone Encounter (Signed)
Noted.  I addressed this today.

## 2019-04-27 NOTE — Telephone Encounter (Signed)
Recd results from [facility] via fax Test date : 04/22/2019, reported 04/25/2019 INR 3.25 Prothrombin time 32.9  Looks like this is being faxed to the wrong office Karmen Bongo listed as physician) which is delaying result to [facility].

## 2019-04-28 ENCOUNTER — Ambulatory Visit: Payer: Medicare Other | Admitting: Podiatry

## 2019-04-29 DIAGNOSIS — D631 Anemia in chronic kidney disease: Secondary | ICD-10-CM | POA: Diagnosis not present

## 2019-04-29 DIAGNOSIS — D509 Iron deficiency anemia, unspecified: Secondary | ICD-10-CM | POA: Diagnosis not present

## 2019-04-29 DIAGNOSIS — N186 End stage renal disease: Secondary | ICD-10-CM | POA: Diagnosis not present

## 2019-04-29 DIAGNOSIS — N2581 Secondary hyperparathyroidism of renal origin: Secondary | ICD-10-CM | POA: Diagnosis not present

## 2019-04-29 DIAGNOSIS — E1122 Type 2 diabetes mellitus with diabetic chronic kidney disease: Secondary | ICD-10-CM | POA: Diagnosis not present

## 2019-05-01 NOTE — Telephone Encounter (Signed)
Left him another detailed message to call me back to see if he wants to come in this Thursday 05/04/2019.

## 2019-05-02 ENCOUNTER — Ambulatory Visit (INDEPENDENT_AMBULATORY_CARE_PROVIDER_SITE_OTHER): Payer: Medicare Other | Admitting: General Practice

## 2019-05-02 DIAGNOSIS — Z7901 Long term (current) use of anticoagulants: Secondary | ICD-10-CM

## 2019-05-02 DIAGNOSIS — N2581 Secondary hyperparathyroidism of renal origin: Secondary | ICD-10-CM | POA: Diagnosis not present

## 2019-05-02 DIAGNOSIS — E1122 Type 2 diabetes mellitus with diabetic chronic kidney disease: Secondary | ICD-10-CM | POA: Diagnosis not present

## 2019-05-02 DIAGNOSIS — N186 End stage renal disease: Secondary | ICD-10-CM | POA: Diagnosis not present

## 2019-05-02 DIAGNOSIS — D509 Iron deficiency anemia, unspecified: Secondary | ICD-10-CM | POA: Diagnosis not present

## 2019-05-02 DIAGNOSIS — D631 Anemia in chronic kidney disease: Secondary | ICD-10-CM | POA: Diagnosis not present

## 2019-05-02 NOTE — Patient Instructions (Signed)
Pre visit review using our clinic review tool, if applicable. No additional management support is needed unless otherwise documented below in the visit note.  Received fax on 7/21.  Continue to take 1 tablet daily. Dosing instructions left on VM.   Re-check in 1 week.

## 2019-05-04 DIAGNOSIS — E1122 Type 2 diabetes mellitus with diabetic chronic kidney disease: Secondary | ICD-10-CM | POA: Diagnosis not present

## 2019-05-04 DIAGNOSIS — D631 Anemia in chronic kidney disease: Secondary | ICD-10-CM | POA: Diagnosis not present

## 2019-05-04 DIAGNOSIS — D509 Iron deficiency anemia, unspecified: Secondary | ICD-10-CM | POA: Diagnosis not present

## 2019-05-04 DIAGNOSIS — N2581 Secondary hyperparathyroidism of renal origin: Secondary | ICD-10-CM | POA: Diagnosis not present

## 2019-05-04 DIAGNOSIS — I82492 Acute embolism and thrombosis of other specified deep vein of left lower extremity: Secondary | ICD-10-CM | POA: Diagnosis not present

## 2019-05-04 DIAGNOSIS — N186 End stage renal disease: Secondary | ICD-10-CM | POA: Diagnosis not present

## 2019-05-05 ENCOUNTER — Other Ambulatory Visit: Payer: Self-pay

## 2019-05-05 MED ORDER — ACCU-CHEK GUIDE VI STRP: 1.0000 | ORAL_STRIP | 1 refills | Status: DC | PRN

## 2019-05-06 DIAGNOSIS — N186 End stage renal disease: Secondary | ICD-10-CM | POA: Diagnosis not present

## 2019-05-06 DIAGNOSIS — N2581 Secondary hyperparathyroidism of renal origin: Secondary | ICD-10-CM | POA: Diagnosis not present

## 2019-05-06 DIAGNOSIS — D631 Anemia in chronic kidney disease: Secondary | ICD-10-CM | POA: Diagnosis not present

## 2019-05-06 DIAGNOSIS — D509 Iron deficiency anemia, unspecified: Secondary | ICD-10-CM | POA: Diagnosis not present

## 2019-05-06 DIAGNOSIS — E1122 Type 2 diabetes mellitus with diabetic chronic kidney disease: Secondary | ICD-10-CM | POA: Diagnosis not present

## 2019-05-08 DIAGNOSIS — N186 End stage renal disease: Secondary | ICD-10-CM | POA: Diagnosis not present

## 2019-05-08 DIAGNOSIS — Z992 Dependence on renal dialysis: Secondary | ICD-10-CM | POA: Diagnosis not present

## 2019-05-08 DIAGNOSIS — T82868A Thrombosis of vascular prosthetic devices, implants and grafts, initial encounter: Secondary | ICD-10-CM | POA: Diagnosis not present

## 2019-05-09 DIAGNOSIS — E1122 Type 2 diabetes mellitus with diabetic chronic kidney disease: Secondary | ICD-10-CM | POA: Diagnosis not present

## 2019-05-09 DIAGNOSIS — N2581 Secondary hyperparathyroidism of renal origin: Secondary | ICD-10-CM | POA: Diagnosis not present

## 2019-05-09 DIAGNOSIS — D509 Iron deficiency anemia, unspecified: Secondary | ICD-10-CM | POA: Diagnosis not present

## 2019-05-09 DIAGNOSIS — D631 Anemia in chronic kidney disease: Secondary | ICD-10-CM | POA: Diagnosis not present

## 2019-05-09 DIAGNOSIS — N186 End stage renal disease: Secondary | ICD-10-CM | POA: Diagnosis not present

## 2019-05-11 DIAGNOSIS — D631 Anemia in chronic kidney disease: Secondary | ICD-10-CM | POA: Diagnosis not present

## 2019-05-11 DIAGNOSIS — D509 Iron deficiency anemia, unspecified: Secondary | ICD-10-CM | POA: Diagnosis not present

## 2019-05-11 DIAGNOSIS — N186 End stage renal disease: Secondary | ICD-10-CM | POA: Diagnosis not present

## 2019-05-11 DIAGNOSIS — I82492 Acute embolism and thrombosis of other specified deep vein of left lower extremity: Secondary | ICD-10-CM | POA: Diagnosis not present

## 2019-05-11 DIAGNOSIS — E1122 Type 2 diabetes mellitus with diabetic chronic kidney disease: Secondary | ICD-10-CM | POA: Diagnosis not present

## 2019-05-11 DIAGNOSIS — N2581 Secondary hyperparathyroidism of renal origin: Secondary | ICD-10-CM | POA: Diagnosis not present

## 2019-05-13 DIAGNOSIS — N186 End stage renal disease: Secondary | ICD-10-CM | POA: Diagnosis not present

## 2019-05-13 DIAGNOSIS — N2581 Secondary hyperparathyroidism of renal origin: Secondary | ICD-10-CM | POA: Diagnosis not present

## 2019-05-13 DIAGNOSIS — E1122 Type 2 diabetes mellitus with diabetic chronic kidney disease: Secondary | ICD-10-CM | POA: Diagnosis not present

## 2019-05-13 DIAGNOSIS — D509 Iron deficiency anemia, unspecified: Secondary | ICD-10-CM | POA: Diagnosis not present

## 2019-05-13 DIAGNOSIS — Z992 Dependence on renal dialysis: Secondary | ICD-10-CM | POA: Diagnosis not present

## 2019-05-15 ENCOUNTER — Other Ambulatory Visit: Payer: Self-pay

## 2019-05-15 ENCOUNTER — Encounter: Payer: Self-pay | Admitting: Podiatry

## 2019-05-15 ENCOUNTER — Ambulatory Visit (INDEPENDENT_AMBULATORY_CARE_PROVIDER_SITE_OTHER): Payer: Medicare Other | Admitting: Podiatry

## 2019-05-15 DIAGNOSIS — L84 Corns and callosities: Secondary | ICD-10-CM | POA: Diagnosis not present

## 2019-05-15 DIAGNOSIS — E1149 Type 2 diabetes mellitus with other diabetic neurological complication: Secondary | ICD-10-CM

## 2019-05-15 MED ORDER — AMMONIUM LACTATE 12 % EX CREA: TOPICAL_CREAM | CUTANEOUS | 0 refills | Status: DC | PRN

## 2019-05-16 ENCOUNTER — Other Ambulatory Visit: Payer: Self-pay

## 2019-05-16 DIAGNOSIS — E1122 Type 2 diabetes mellitus with diabetic chronic kidney disease: Secondary | ICD-10-CM | POA: Diagnosis not present

## 2019-05-16 DIAGNOSIS — N186 End stage renal disease: Secondary | ICD-10-CM | POA: Diagnosis not present

## 2019-05-16 DIAGNOSIS — D509 Iron deficiency anemia, unspecified: Secondary | ICD-10-CM | POA: Diagnosis not present

## 2019-05-16 DIAGNOSIS — N2581 Secondary hyperparathyroidism of renal origin: Secondary | ICD-10-CM | POA: Diagnosis not present

## 2019-05-16 DIAGNOSIS — Z992 Dependence on renal dialysis: Secondary | ICD-10-CM | POA: Diagnosis not present

## 2019-05-16 MED ORDER — ALLOPURINOL 100 MG PO TABS: 100.0000 mg | ORAL_TABLET | ORAL | 6 refills | Status: DC

## 2019-05-17 NOTE — Progress Notes (Signed)
Subjective: 53 year old male presents the office today for reevaluation.  States that overall he is doing well he does have some calluses on his right foot.  Denies any open sores.  No swelling. Denies any systemic complaints such as fevers, chills, nausea, vomiting. No acute changes since last appointment, and no other complaints at this time.   Objective: AAO x3, NAD Neurovascular status unchanged.   There are 2 hyperkeratotic lesions on the right foot submetatarsal area.  Upon debridement there is no ongoing ulceration, drainage or any signs of infection.  He was concerned about an area on the left foot on the arch of the foot but after I cleaned that this was dirt.  No open sores.  No open lesions or pre-ulcerative lesions.  No pain with calf compression, swelling, warmth, erythema  Assessment: Pre-ulcerative calluses right foot  Plan: -All treatment options discussed with the patient including all alternatives, risks, complications.  -Patient instructed debrided x2 without any complications or bleeding.  Discussed moisturizer.  Discussed the importance of daily foot inspection. -Patient encouraged to call the office with any questions, concerns, change in symptoms.   Return in about 9 weeks (around 07/17/2019).  Trula Slade DPM

## 2019-05-18 DIAGNOSIS — I82492 Acute embolism and thrombosis of other specified deep vein of left lower extremity: Secondary | ICD-10-CM | POA: Diagnosis not present

## 2019-05-18 DIAGNOSIS — D509 Iron deficiency anemia, unspecified: Secondary | ICD-10-CM | POA: Diagnosis not present

## 2019-05-18 DIAGNOSIS — Z992 Dependence on renal dialysis: Secondary | ICD-10-CM | POA: Diagnosis not present

## 2019-05-18 DIAGNOSIS — E1122 Type 2 diabetes mellitus with diabetic chronic kidney disease: Secondary | ICD-10-CM | POA: Diagnosis not present

## 2019-05-18 DIAGNOSIS — N2581 Secondary hyperparathyroidism of renal origin: Secondary | ICD-10-CM | POA: Diagnosis not present

## 2019-05-18 DIAGNOSIS — M109 Gout, unspecified: Secondary | ICD-10-CM | POA: Diagnosis not present

## 2019-05-18 DIAGNOSIS — N186 End stage renal disease: Secondary | ICD-10-CM | POA: Diagnosis not present

## 2019-05-20 DIAGNOSIS — E1122 Type 2 diabetes mellitus with diabetic chronic kidney disease: Secondary | ICD-10-CM | POA: Diagnosis not present

## 2019-05-20 DIAGNOSIS — D509 Iron deficiency anemia, unspecified: Secondary | ICD-10-CM | POA: Diagnosis not present

## 2019-05-20 DIAGNOSIS — N2581 Secondary hyperparathyroidism of renal origin: Secondary | ICD-10-CM | POA: Diagnosis not present

## 2019-05-20 DIAGNOSIS — N186 End stage renal disease: Secondary | ICD-10-CM | POA: Diagnosis not present

## 2019-05-20 DIAGNOSIS — Z992 Dependence on renal dialysis: Secondary | ICD-10-CM | POA: Diagnosis not present

## 2019-05-23 DIAGNOSIS — N2581 Secondary hyperparathyroidism of renal origin: Secondary | ICD-10-CM | POA: Diagnosis not present

## 2019-05-23 DIAGNOSIS — E1122 Type 2 diabetes mellitus with diabetic chronic kidney disease: Secondary | ICD-10-CM | POA: Diagnosis not present

## 2019-05-23 DIAGNOSIS — Z992 Dependence on renal dialysis: Secondary | ICD-10-CM | POA: Diagnosis not present

## 2019-05-23 DIAGNOSIS — D509 Iron deficiency anemia, unspecified: Secondary | ICD-10-CM | POA: Diagnosis not present

## 2019-05-23 DIAGNOSIS — N186 End stage renal disease: Secondary | ICD-10-CM | POA: Diagnosis not present

## 2019-05-24 ENCOUNTER — Encounter: Payer: Self-pay | Admitting: *Deleted

## 2019-05-24 ENCOUNTER — Ambulatory Visit (INDEPENDENT_AMBULATORY_CARE_PROVIDER_SITE_OTHER): Payer: Medicare Other | Admitting: Vascular Surgery

## 2019-05-24 ENCOUNTER — Ambulatory Visit (INDEPENDENT_AMBULATORY_CARE_PROVIDER_SITE_OTHER)
Admission: RE | Admit: 2019-05-24 | Discharge: 2019-05-24 | Disposition: A | Discharge status: Home / Self Care | Payer: Medicare Other | Source: Ambulatory Visit | Attending: Vascular Surgery | Admitting: Vascular Surgery

## 2019-05-24 ENCOUNTER — Other Ambulatory Visit: Payer: Self-pay | Admitting: *Deleted

## 2019-05-24 ENCOUNTER — Ambulatory Visit (HOSPITAL_COMMUNITY)
Admission: RE | Admit: 2019-05-24 | Discharge: 2019-05-24 | Disposition: A | Discharge status: Home / Self Care | Payer: Medicare Other | Source: Ambulatory Visit | Attending: Vascular Surgery | Admitting: Vascular Surgery

## 2019-05-24 ENCOUNTER — Other Ambulatory Visit: Payer: Self-pay

## 2019-05-24 ENCOUNTER — Encounter: Payer: Self-pay | Admitting: Vascular Surgery

## 2019-05-24 ENCOUNTER — Telehealth: Payer: Self-pay | Admitting: *Deleted

## 2019-05-24 VITALS — BP 103/57 | HR 94 | Temp 98.3°F | Resp 18 | Ht 70.0 in | Wt 303.0 lb

## 2019-05-24 DIAGNOSIS — Z992 Dependence on renal dialysis: Secondary | ICD-10-CM

## 2019-05-24 DIAGNOSIS — N186 End stage renal disease: Secondary | ICD-10-CM | POA: Insufficient documentation

## 2019-05-24 NOTE — H&P (View-Only) (Signed)
Referring Physician: Dr Lorrene Reid  Patient name: [name] [name] MRN: [id number] DOB: 10-10-1966 Sex: male  REASON FOR CONSULT: Hemodialysis access  HPI: [name] [name] is a 53 y.o. male, who has had multiple prior failed access procedures.  His most recent access was a right basilic vein transposition fistula.  He is also previously had a fistula in the left arm.  This was ligated for aneurysmal degeneration.  He is right-handed.  His dialysis today is Tuesday Thursday Saturday.  Other medical problems include aortic insufficiency, COPD, diabetes, peripheral neuropathy, retinopathy, gastroparesis, obesity, hyperlipidemia, hypertension all are currently stable.  He is on warfarin for a prior DVT.  He also has a history of increased clotting on dialysis so portions of the anticoagulation are secondary to keep his graft patency.  Past Medical History:  Diagnosis Date  . Allergic rhinitis   . Anemia   . Aortic insufficiency    a. Echo 7/16:  Mod LVH, EF 50-55%, mild to mod AI, severe LAE, PASP 57 mmHg (LV ID end diastolic 80.0 mm);  b. Echo 2/17:  Mod LVH, EF 50-55%, mod AI, MAC, mild MR, mod LAE, mild to mod TR, PASP 63 mmHg  . Bilateral carpal tunnel syndrome   . CHF (congestive heart failure) ([place])   . Claustrophobia   . COPD (chronic obstructive pulmonary disease) ([place])   . Diabetes mellitus    Type II  . Diabetic peripheral neuropathy ([place])   . Diabetic retinopathy ([place])   . Discitis of lumbar region    resolved  . DVT (deep venous thrombosis) ([place]) 10/07/2015   LLE  . Endocarditis 11/28/14  . ESRD (end stage renal disease) on dialysis [facility])    s/p renal transplant in 2009, failed in 2016  . Gait disorder   . Gastroparesis   . GERD (gastroesophageal reflux disease)   . Gout   . Hearing difficulty   . Hyperlipidemia   . Hypertension   . Incomplete bladder emptying   . Leg pain 02/05/11   with walking  . Morbid obesity ([place])   . Obstructive sleep apnea    not using CPAP- lost  100lbs  . Osteomyelitis ([place])   . Posttraumatic stress disorder   . Rotator cuff disorder   . Secondary hyperparathyroidism ([place])   . Sepsis ([place])    Postive blood cultures -   . Urethral stricture   . Vitamin D deficiency    Past Surgical History:  Procedure Laterality Date  . ARTERIOVENOUS GRAFT PLACEMENT Bilateral "several"  . AV FISTULA PLACEMENT Bilateral 2015  . BASCILIC VEIN TRANSPOSITION Right 09/27/2015  . [place] Right 09/27/2015   Procedure: BASILIC VEIN TRANSPOSITION right;  Surgeon: Mal Misty, MD;  Location: [place];  Service: Vascular;  Laterality: Right;  . CATARACT EXTRACTION W/ INTRAOCULAR LENS  IMPLANT, BILATERAL Bilateral   . CYSTOSCOPY W/ INTERNAL URETHROTOMY  09/2014  . KIDNEY TRANSPLANT  2009    Family History  Problem Relation Age of Onset  . Hypertension Mother   . Diabetes Father     SOCIAL HISTORY: Social History   Socioeconomic History  . Marital status: Divorced    Spouse name: Not on file  . Number of children: 0  . Years of education: College   . Highest education level: Not on file  Occupational History    Employer: UNEMPLOYED  Social Needs  . Financial resource strain: Not on file  . Food insecurity    Worry: Not on file    Inability:  Not on file  . Transportation needs    Medical: Not on file    Non-medical: Not on file  Tobacco Use  . Smoking status: Never Smoker  . Smokeless tobacco: Never Used  Substance and Sexual Activity  . Alcohol use: No    Alcohol/week: 0.0 standard drinks  . Drug use: No  . Sexual activity: Not Currently  Lifestyle  . Physical activity    Days per week: Not on file    Minutes per session: Not on file  . Stress: Not on file  Relationships  . Social Herbalist on phone: Not on file    Gets together: Not on file    Attends religious service: Not on file    Active member of club or organization: Not on file    Attends meetings of clubs or organizations: Not on  file    Relationship status: Not on file  . Intimate partner violence    Fear of current or ex partner: Not on file    Emotionally abused: Not on file    Physically abused: Not on file    Forced sexual activity: Not on file  Other Topics Concern  . Not on file  Social History Narrative   Patient is Divorced.   Patient does not have any children.   Patient is right-handed   Patient is currently in college working on his Ph.D.       Allergies  Allergen Reactions  . Pork-Derived Products Anaphylaxis and Other (See Comments)    Fever also   . Ace Inhibitors Cough  . Hydrocodone Other (See Comments)    Hallucinations    Current Outpatient Medications  Medication Sig Dispense Refill  . allopurinol (ZYLOPRIM) 100 MG tablet Take 1 tablet (100 mg total) by mouth 3 (three) times a week. After dialysis 30 tablet 6  . ammonium lactate (AMLACTIN) 12 % cream Apply topically as needed for dry skin. 385 g 0  . calcium acetate (PHOSLO) 667 MG capsule Take 3 capsules (2,001 mg total) by mouth 3 (three) times daily with meals. 270 capsule 0  . clonazePAM (KLONOPIN) 0.5 MG tablet     . doxycycline (VIBRA-TABS) 100 MG tablet Take 1 tablet (100 mg total) by mouth 2 (two) times daily. 20 tablet 0  . DROPLET PEN NEEDLES 32G X 4 MM MISC USE  TO INJECT FIVE TIMES DAILY AS DIRECTED 500 each 1  . glucose blood (ACCU-CHEK GUIDE) test strip 1 each by Other route as needed for other. Use as instructed to check blood sugar 4 times daily. DX:E11.65 400 each 1  . glucose blood (ACCU-CHEK SMARTVIEW) test strip Use to test 4 times daily DX code E11.29 125 each 5  . hydrocortisone-pramoxine (PROCTOFOAM-HC) rectal foam Place 1 applicator rectally 3 (three) times daily. Prn hemorrhoids 10 g 0  . Insulin Glargine (TOUJEO SOLOSTAR) 300 UNIT/ML SOPN Inject 70-80 Units into the skin 2 (two) times daily. Inject 80 units in the morning and inject 70 units in the evening. 270 mL 1  . Insulin Pen Needle 32G X 8 MM MISC Use  as directed 100 each 0  . insulin regular human CONCENTRATED (HUMULIN R U-500 KWIKPEN) 500 UNIT/ML kwikpen INJECT SUBCUTANEOUSLY 50 UNITS BEFORE BREAKFAST, 70 UNITS FOR LUNCH, AND 40 UNITS FOR SUPPER.  Inject 30 minutes before eating 2 pen 0  . midodrine (PROAMATINE) 10 MG tablet Take 10 mg by mouth as needed (low blood pressure).     Marland Kitchen omeprazole (PRILOSEC) 20  MG capsule TAKE 1 CAPSULE EVERY DAY 90 capsule 1  . ONE TOUCH LANCETS MISC Use to test sugar 4 times daily 400 each 0  . OZEMPIC, 0.25 OR 0.5 MG/DOSE, 2 MG/1.5ML SOPN     . RENVELA 800 MG tablet Take 2,400 mg by mouth 3 (three) times daily with meals.     . rosuvastatin (CRESTOR) 20 MG tablet TAKE 1 TABLET AT BEDTIME 90 tablet 1  . silver sulfADIAZINE (SILVADENE) 1 % cream Apply 1 application topically daily. 50 g 0  . Suvorexant (BELSOMRA) 10 MG TABS Take 10 mg by mouth at bedtime. 30 tablet 1  . topiramate (TOPAMAX) 100 MG tablet     . triamcinolone cream (KENALOG) 0.1 % Apply 1 application topically 2 (two) times daily. 100 g 2  . VELTASSA 8.4 g packet Take 8.4 g by mouth daily.     Marland Kitchen warfarin (COUMADIN) 5 MG tablet Take 1 (5 mg) tablet every day except for Wednesday & Saturday when you take 7.50m (1.5 tablets) OR As directed. 105 tablet 0   No current facility-administered medications for this visit.     ROS:   General:  No weight loss, Fever, chills  Cardiac: No recent episodes of chest pain/pressure, no shortness of breath at rest.  + shortness of breath with exertion.  Denies history of atrial fibrillation or irregular heartbeat  Pulmonary: No home oxygen, no productive cough, no hemoptysis,  No asthma or wheezing  Skin: No rashes  Physical Examination  Vitals:   05/24/19 1235  BP: (!) 103/57  Pulse: 94  Resp: 18  Temp: 98.3 F (36.8 C)  TempSrc: Temporal  SpO2: 96%  Weight: (!) 303 lb (137.4 kg)  Height: _0  (1.778 m)   General:  Alert and oriented, no acute distress HEENT: Normal, patient has right-sided  dialysis catheter Cardiac: Regular Rate and Rhythm  Abdomen: Soft, non-tender, non-distended, obese Skin: No rash Extremity Pulses:  2+ radial, brachial pulses bilaterally, thrombosed pulsatile AV fistula left arm with aneurysmal degeneration, thrombosed aneurysmal degeneration right basilic vein fistula Musculoskeletal: No deformity or edema  Neurologic: Upper and lower extremity motor 5/5 and symmetric  DATA:  Patient had a vein mapping ultrasound today which showed no significant veins available for creation of a fistula.  He had essentially normal arterial duplex exam.  ASSESSMENT: Patient needs long-term hemodialysis access.  He is not going to be a candidate for fistula as he really has no usable vein.  Due to his multiple prior access procedures the believe he needs bilateral central venogram for planning purposes prior to proceeding with placing an AV graft.  We have scheduled a central venogram for Friday, June 02, 2019.  He would not need to stop his warfarin prior to the central venogram.  However we will need to navigate around his warfarin when we plan his AV graft after that.   PLAN: See above   CRuta Hinds MD [facility] GFisher IslandOffice: [phone]: [phone]

## 2019-05-24 NOTE — Progress Notes (Signed)
  Referring Physician: Dr Dunham  Patient name: [name] [name] MRN: [id number] DOB: 11/24/1965 Sex: male  REASON FOR CONSULT: Hemodialysis access  HPI: [name] [name] is a 53 y.o. male, who has had multiple prior failed access procedures.  His most recent access was a right basilic vein transposition fistula.  He is also previously had a fistula in the left arm.  This was ligated for aneurysmal degeneration.  He is right-handed.  His dialysis today is Tuesday Thursday Saturday.  Other medical problems include aortic insufficiency, COPD, diabetes, peripheral neuropathy, retinopathy, gastroparesis, obesity, hyperlipidemia, hypertension all are currently stable.  He is on warfarin for a prior DVT.  He also has a history of increased clotting on dialysis so portions of the anticoagulation are secondary to keep his graft patency.  Past Medical History:  Diagnosis Date  . Allergic rhinitis   . Anemia   . Aortic insufficiency    a. Echo 7/16:  Mod LVH, EF 50-55%, mild to mod AI, severe LAE, PASP 57 mmHg (LV ID end diastolic 55.5 mm);  b. Echo 2/17:  Mod LVH, EF 50-55%, mod AI, MAC, mild MR, mod LAE, mild to mod TR, PASP 63 mmHg  . Bilateral carpal tunnel syndrome   . CHF (congestive heart failure) (HCC)   . Claustrophobia   . COPD (chronic obstructive pulmonary disease) (HCC)   . Diabetes mellitus    Type II  . Diabetic peripheral neuropathy (HCC)   . Diabetic retinopathy (HCC)   . Discitis of lumbar region    resolved  . DVT (deep venous thrombosis) (HCC) 10/07/2015   LLE  . Endocarditis 11/28/14  . ESRD (end stage renal disease) on dialysis (HCC)    s/p renal transplant in 2009, failed in 2016  . Gait disorder   . Gastroparesis   . GERD (gastroesophageal reflux disease)   . Gout   . Hearing difficulty   . Hyperlipidemia   . Hypertension   . Incomplete bladder emptying   . Leg pain 02/05/11   with walking  . Morbid obesity (HCC)   . Obstructive sleep apnea    not using CPAP- lost  100lbs  . Osteomyelitis (HCC)   . Posttraumatic stress disorder   . Rotator cuff disorder   . Secondary hyperparathyroidism (HCC)   . Sepsis (HCC)    Postive blood cultures -   . Urethral stricture   . Vitamin D deficiency    Past Surgical History:  Procedure Laterality Date  . ARTERIOVENOUS GRAFT PLACEMENT Bilateral "several"  . AV FISTULA PLACEMENT Bilateral 2015  . BASCILIC VEIN TRANSPOSITION Right 09/27/2015  . BASCILIC VEIN TRANSPOSITION Right 09/27/2015   Procedure: BASILIC VEIN TRANSPOSITION right;  Surgeon: James D Lawson, MD;  Location: [facility] OR;  Service: Vascular;  Laterality: Right;  . CATARACT EXTRACTION W/ INTRAOCULAR LENS  IMPLANT, BILATERAL Bilateral   . CYSTOSCOPY W/ INTERNAL URETHROTOMY  09/2014  . KIDNEY TRANSPLANT  2009    Family History  Problem Relation Age of Onset  . Hypertension Mother   . Diabetes Father     SOCIAL HISTORY: Social History   Socioeconomic History  . Marital status: Divorced    Spouse name: Not on file  . Number of children: 0  . Years of education: College   . Highest education level: Not on file  Occupational History    Employer: UNEMPLOYED  Social Needs  . Financial resource strain: Not on file  . Food insecurity    Worry: Not on file    Inability:   Not on file  . Transportation needs    Medical: Not on file    Non-medical: Not on file  Tobacco Use  . Smoking status: Never Smoker  . Smokeless tobacco: Never Used  Substance and Sexual Activity  . Alcohol use: No    Alcohol/week: 0.0 standard drinks  . Drug use: No  . Sexual activity: Not Currently  Lifestyle  . Physical activity    Days per week: Not on file    Minutes per session: Not on file  . Stress: Not on file  Relationships  . Social connections    Talks on phone: Not on file    Gets together: Not on file    Attends religious service: Not on file    Active member of club or organization: Not on file    Attends meetings of clubs or organizations: Not on  file    Relationship status: Not on file  . Intimate partner violence    Fear of current or ex partner: Not on file    Emotionally abused: Not on file    Physically abused: Not on file    Forced sexual activity: Not on file  Other Topics Concern  . Not on file  Social History Narrative   Patient is Divorced.   Patient does not have any children.   Patient is right-handed   Patient is currently in college working on his Ph.D.       Allergies  Allergen Reactions  . Pork-Derived Products Anaphylaxis and Other (See Comments)    Fever also   . Ace Inhibitors Cough  . Hydrocodone Other (See Comments)    Hallucinations    Current Outpatient Medications  Medication Sig Dispense Refill  . allopurinol (ZYLOPRIM) 100 MG tablet Take 1 tablet (100 mg total) by mouth 3 (three) times a week. After dialysis 30 tablet 6  . ammonium lactate (AMLACTIN) 12 % cream Apply topically as needed for dry skin. 385 g 0  . calcium acetate (PHOSLO) 667 MG capsule Take 3 capsules (2,001 mg total) by mouth 3 (three) times daily with meals. 270 capsule 0  . clonazePAM (KLONOPIN) 0.5 MG tablet     . doxycycline (VIBRA-TABS) 100 MG tablet Take 1 tablet (100 mg total) by mouth 2 (two) times daily. 20 tablet 0  . DROPLET PEN NEEDLES 32G X 4 MM MISC USE  TO INJECT FIVE TIMES DAILY AS DIRECTED 500 each 1  . glucose blood (ACCU-CHEK GUIDE) test strip 1 each by Other route as needed for other. Use as instructed to check blood sugar 4 times daily. DX:E11.65 400 each 1  . glucose blood (ACCU-CHEK SMARTVIEW) test strip Use to test 4 times daily DX code E11.29 125 each 5  . hydrocortisone-pramoxine (PROCTOFOAM-HC) rectal foam Place 1 applicator rectally 3 (three) times daily. Prn hemorrhoids 10 g 0  . Insulin Glargine (TOUJEO SOLOSTAR) 300 UNIT/ML SOPN Inject 70-80 Units into the skin 2 (two) times daily. Inject 80 units in the morning and inject 70 units in the evening. 270 mL 1  . Insulin Pen Needle 32G X 8 MM MISC Use  as directed 100 each 0  . insulin regular human CONCENTRATED (HUMULIN R U-500 KWIKPEN) 500 UNIT/ML kwikpen INJECT SUBCUTANEOUSLY 50 UNITS BEFORE BREAKFAST, 70 UNITS FOR LUNCH, AND 40 UNITS FOR SUPPER.  Inject 30 minutes before eating 2 pen 0  . midodrine (PROAMATINE) 10 MG tablet Take 10 mg by mouth as needed (low blood pressure).     . omeprazole (PRILOSEC) 20   MG capsule TAKE 1 CAPSULE EVERY DAY 90 capsule 1  . ONE TOUCH LANCETS MISC Use to test sugar 4 times daily 400 each 0  . OZEMPIC, 0.25 OR 0.5 MG/DOSE, 2 MG/1.5ML SOPN     . RENVELA 800 MG tablet Take 2,400 mg by mouth 3 (three) times daily with meals.     . rosuvastatin (CRESTOR) 20 MG tablet TAKE 1 TABLET AT BEDTIME 90 tablet 1  . silver sulfADIAZINE (SILVADENE) 1 % cream Apply 1 application topically daily. 50 g 0  . Suvorexant (BELSOMRA) 10 MG TABS Take 10 mg by mouth at bedtime. 30 tablet 1  . topiramate (TOPAMAX) 100 MG tablet     . triamcinolone cream (KENALOG) 0.1 % Apply 1 application topically 2 (two) times daily. 100 g 2  . VELTASSA 8.4 g packet Take 8.4 g by mouth daily.     . warfarin (COUMADIN) 5 MG tablet Take 1 (5 mg) tablet every day except for Wednesday & Saturday when you take 7.5mg (1.5 tablets) OR As directed. 105 tablet 0   No current facility-administered medications for this visit.     ROS:   General:  No weight loss, Fever, chills  Cardiac: No recent episodes of chest pain/pressure, no shortness of breath at rest.  + shortness of breath with exertion.  Denies history of atrial fibrillation or irregular heartbeat  Pulmonary: No home oxygen, no productive cough, no hemoptysis,  No asthma or wheezing  Skin: No rashes  Physical Examination  Vitals:   05/24/19 1235  BP: (!) 103/57  Pulse: 94  Resp: 18  Temp: 98.3 F (36.8 C)  TempSrc: Temporal  SpO2: 96%  Weight: (!) 303 lb (137.4 kg)  Height: 5' 10" (1.778 m)   General:  Alert and oriented, no acute distress HEENT: Normal, patient has right-sided  dialysis catheter Cardiac: Regular Rate and Rhythm  Abdomen: Soft, non-tender, non-distended, obese Skin: No rash Extremity Pulses:  2+ radial, brachial pulses bilaterally, thrombosed pulsatile AV fistula left arm with aneurysmal degeneration, thrombosed aneurysmal degeneration right basilic vein fistula Musculoskeletal: No deformity or edema  Neurologic: Upper and lower extremity motor 5/5 and symmetric  DATA:  Patient had a vein mapping ultrasound today which showed no significant veins available for creation of a fistula.  He had essentially normal arterial duplex exam.  ASSESSMENT: Patient needs long-term hemodialysis access.  He is not going to be a candidate for fistula as he really has no usable vein.  Due to his multiple prior access procedures the believe he needs bilateral central venogram for planning purposes prior to proceeding with placing an AV graft.  We have scheduled a central venogram for Friday, June 02, 2019.  He would not need to stop his warfarin prior to the central venogram.  However we will need to navigate around his warfarin when we plan his AV graft after that.   PLAN: See above   Charles Fields, MD [facility] Office: [phone] Pager: [phone]   

## 2019-05-24 NOTE — H&P (View-Only) (Signed)
Referring Physician: Dr Lorrene Reid  Patient name: [name] [name] MRN: [id number] DOB: 09-07-1966 Sex: male  REASON FOR CONSULT: Hemodialysis access  HPI: Nazir Hacker is a 53 y.o. male, who has had multiple prior failed access procedures.  His most recent access was a right basilic vein transposition fistula.  He is also previously had a fistula in the left arm.  This was ligated for aneurysmal degeneration.  He is right-handed.  His dialysis today is Tuesday Thursday Saturday.  Other medical problems include aortic insufficiency, COPD, diabetes, peripheral neuropathy, retinopathy, gastroparesis, obesity, hyperlipidemia, hypertension all are currently stable.  He is on warfarin for a prior DVT.  He also has a history of increased clotting on dialysis so portions of the anticoagulation are secondary to keep his graft patency.  Past Medical History:  Diagnosis Date  . Allergic rhinitis   . Anemia   . Aortic insufficiency    a. Echo 7/16:  Mod LVH, EF 50-55%, mild to mod AI, severe LAE, PASP 57 mmHg (LV ID end diastolic 83.4 mm);  b. Echo 2/17:  Mod LVH, EF 50-55%, mod AI, MAC, mild MR, mod LAE, mild to mod TR, PASP 63 mmHg  . Bilateral carpal tunnel syndrome   . CHF (congestive heart failure) ([place])   . Claustrophobia   . COPD (chronic obstructive pulmonary disease) ([place])   . Diabetes mellitus    Type II  . Diabetic peripheral neuropathy ([place])   . Diabetic retinopathy ([place])   . Discitis of lumbar region    resolved  . DVT (deep venous thrombosis) ([place]) 10/07/2015   LLE  . Endocarditis 11/28/14  . ESRD (end stage renal disease) on dialysis [facility])    s/p renal transplant in 2009, failed in 2016  . Gait disorder   . Gastroparesis   . GERD (gastroesophageal reflux disease)   . Gout   . Hearing difficulty   . Hyperlipidemia   . Hypertension   . Incomplete bladder emptying   . Leg pain 02/05/11   with walking  . Morbid obesity ([place])   . Obstructive sleep apnea    not using CPAP- lost  100lbs  . Osteomyelitis ([place])   . Posttraumatic stress disorder   . Rotator cuff disorder   . Secondary hyperparathyroidism ([place])   . Sepsis ([place])    Postive blood cultures -   . Urethral stricture   . Vitamin D deficiency    Past Surgical History:  Procedure Laterality Date  . ARTERIOVENOUS GRAFT PLACEMENT Bilateral "several"  . AV FISTULA PLACEMENT Bilateral 2015  . BASCILIC VEIN TRANSPOSITION Right 09/27/2015  . [place] Right 09/27/2015   Procedure: BASILIC VEIN TRANSPOSITION right;  Surgeon: Mal Misty, MD;  Location: [place];  Service: Vascular;  Laterality: Right;  . CATARACT EXTRACTION W/ INTRAOCULAR LENS  IMPLANT, BILATERAL Bilateral   . CYSTOSCOPY W/ INTERNAL URETHROTOMY  09/2014  . KIDNEY TRANSPLANT  2009    Family History  Problem Relation Age of Onset  . Hypertension Mother   . Diabetes Father     SOCIAL HISTORY: Social History   Socioeconomic History  . Marital status: Divorced    Spouse name: Not on file  . Number of children: 0  . Years of education: College   . Highest education level: Not on file  Occupational History    Employer: UNEMPLOYED  Social Needs  . Financial resource strain: Not on file  . Food insecurity    Worry: Not on file    Inability:  Not on file  . Transportation needs    Medical: Not on file    Non-medical: Not on file  Tobacco Use  . Smoking status: Never Smoker  . Smokeless tobacco: Never Used  Substance and Sexual Activity  . Alcohol use: No    Alcohol/week: 0.0 standard drinks  . Drug use: No  . Sexual activity: Not Currently  Lifestyle  . Physical activity    Days per week: Not on file    Minutes per session: Not on file  . Stress: Not on file  Relationships  . Social Herbalist on phone: Not on file    Gets together: Not on file    Attends religious service: Not on file    Active member of club or organization: Not on file    Attends meetings of clubs or organizations: Not on  file    Relationship status: Not on file  . Intimate partner violence    Fear of current or ex partner: Not on file    Emotionally abused: Not on file    Physically abused: Not on file    Forced sexual activity: Not on file  Other Topics Concern  . Not on file  Social History Narrative   Patient is Divorced.   Patient does not have any children.   Patient is right-handed   Patient is currently in college working on his Ph.D.       Allergies  Allergen Reactions  . Pork-Derived Products Anaphylaxis and Other (See Comments)    Fever also   . Ace Inhibitors Cough  . Hydrocodone Other (See Comments)    Hallucinations    Current Outpatient Medications  Medication Sig Dispense Refill  . allopurinol (ZYLOPRIM) 100 MG tablet Take 1 tablet (100 mg total) by mouth 3 (three) times a week. After dialysis 30 tablet 6  . ammonium lactate (AMLACTIN) 12 % cream Apply topically as needed for dry skin. 385 g 0  . calcium acetate (PHOSLO) 667 MG capsule Take 3 capsules (2,001 mg total) by mouth 3 (three) times daily with meals. 270 capsule 0  . clonazePAM (KLONOPIN) 0.5 MG tablet     . doxycycline (VIBRA-TABS) 100 MG tablet Take 1 tablet (100 mg total) by mouth 2 (two) times daily. 20 tablet 0  . DROPLET PEN NEEDLES 32G X 4 MM MISC USE  TO INJECT FIVE TIMES DAILY AS DIRECTED 500 each 1  . glucose blood (ACCU-CHEK GUIDE) test strip 1 each by Other route as needed for other. Use as instructed to check blood sugar 4 times daily. DX:E11.65 400 each 1  . glucose blood (ACCU-CHEK SMARTVIEW) test strip Use to test 4 times daily DX code E11.29 125 each 5  . hydrocortisone-pramoxine (PROCTOFOAM-HC) rectal foam Place 1 applicator rectally 3 (three) times daily. Prn hemorrhoids 10 g 0  . Insulin Glargine (TOUJEO SOLOSTAR) 300 UNIT/ML SOPN Inject 70-80 Units into the skin 2 (two) times daily. Inject 80 units in the morning and inject 70 units in the evening. 270 mL 1  . Insulin Pen Needle 32G X 8 MM MISC Use  as directed 100 each 0  . insulin regular human CONCENTRATED (HUMULIN R U-500 KWIKPEN) 500 UNIT/ML kwikpen INJECT SUBCUTANEOUSLY 50 UNITS BEFORE BREAKFAST, 70 UNITS FOR LUNCH, AND 40 UNITS FOR SUPPER.  Inject 30 minutes before eating 2 pen 0  . midodrine (PROAMATINE) 10 MG tablet Take 10 mg by mouth as needed (low blood pressure).     Marland Kitchen omeprazole (PRILOSEC) 20  MG capsule TAKE 1 CAPSULE EVERY DAY 90 capsule 1  . ONE TOUCH LANCETS MISC Use to test sugar 4 times daily 400 each 0  . OZEMPIC, 0.25 OR 0.5 MG/DOSE, 2 MG/1.5ML SOPN     . RENVELA 800 MG tablet Take 2,400 mg by mouth 3 (three) times daily with meals.     . rosuvastatin (CRESTOR) 20 MG tablet TAKE 1 TABLET AT BEDTIME 90 tablet 1  . silver sulfADIAZINE (SILVADENE) 1 % cream Apply 1 application topically daily. 50 g 0  . Suvorexant (BELSOMRA) 10 MG TABS Take 10 mg by mouth at bedtime. 30 tablet 1  . topiramate (TOPAMAX) 100 MG tablet     . triamcinolone cream (KENALOG) 0.1 % Apply 1 application topically 2 (two) times daily. 100 g 2  . VELTASSA 8.4 g packet Take 8.4 g by mouth daily.     Marland Kitchen warfarin (COUMADIN) 5 MG tablet Take 1 (5 mg) tablet every day except for Wednesday & Saturday when you take 7.36m (1.5 tablets) OR As directed. 105 tablet 0   No current facility-administered medications for this visit.     ROS:   General:  No weight loss, Fever, chills  Cardiac: No recent episodes of chest pain/pressure, no shortness of breath at rest.  + shortness of breath with exertion.  Denies history of atrial fibrillation or irregular heartbeat  Pulmonary: No home oxygen, no productive cough, no hemoptysis,  No asthma or wheezing  Skin: No rashes  Physical Examination  Vitals:   05/24/19 1235  BP: (!) 103/57  Pulse: 94  Resp: 18  Temp: 98.3 F (36.8 C)  TempSrc: Temporal  SpO2: 96%  Weight: (!) 303 lb (137.4 kg)  Height: _0  (1.778 m)   General:  Alert and oriented, no acute distress HEENT: Normal, patient has right-sided  dialysis catheter Cardiac: Regular Rate and Rhythm  Abdomen: Soft, non-tender, non-distended, obese Skin: No rash Extremity Pulses:  2+ radial, brachial pulses bilaterally, thrombosed pulsatile AV fistula left arm with aneurysmal degeneration, thrombosed aneurysmal degeneration right basilic vein fistula Musculoskeletal: No deformity or edema  Neurologic: Upper and lower extremity motor 5/5 and symmetric  DATA:  Patient had a vein mapping ultrasound today which showed no significant veins available for creation of a fistula.  He had essentially normal arterial duplex exam.  ASSESSMENT: Patient needs long-term hemodialysis access.  He is not going to be a candidate for fistula as he really has no usable vein.  Due to his multiple prior access procedures the believe he needs bilateral central venogram for planning purposes prior to proceeding with placing an AV graft.  We have scheduled a central venogram for Friday, June 02, 2019.  He would not need to stop his warfarin prior to the central venogram.  However we will need to navigate around his warfarin when we plan his AV graft after that.   PLAN: See above   CRuta Hinds MD [facility]: [phone]: [phone]

## 2019-05-24 NOTE — Telephone Encounter (Signed)
Patient called and instructed to continue Coumdin. No hold for scheduled 06/02/2019 procedure. Verbalized understanding.

## 2019-05-25 DIAGNOSIS — Z992 Dependence on renal dialysis: Secondary | ICD-10-CM | POA: Diagnosis not present

## 2019-05-25 DIAGNOSIS — D509 Iron deficiency anemia, unspecified: Secondary | ICD-10-CM | POA: Diagnosis not present

## 2019-05-25 DIAGNOSIS — E1122 Type 2 diabetes mellitus with diabetic chronic kidney disease: Secondary | ICD-10-CM | POA: Diagnosis not present

## 2019-05-25 DIAGNOSIS — N186 End stage renal disease: Secondary | ICD-10-CM | POA: Diagnosis not present

## 2019-05-25 DIAGNOSIS — I82492 Acute embolism and thrombosis of other specified deep vein of left lower extremity: Secondary | ICD-10-CM | POA: Diagnosis not present

## 2019-05-25 DIAGNOSIS — N2581 Secondary hyperparathyroidism of renal origin: Secondary | ICD-10-CM | POA: Diagnosis not present

## 2019-05-27 DIAGNOSIS — N2581 Secondary hyperparathyroidism of renal origin: Secondary | ICD-10-CM | POA: Diagnosis not present

## 2019-05-27 DIAGNOSIS — D509 Iron deficiency anemia, unspecified: Secondary | ICD-10-CM | POA: Diagnosis not present

## 2019-05-27 DIAGNOSIS — Z992 Dependence on renal dialysis: Secondary | ICD-10-CM | POA: Diagnosis not present

## 2019-05-27 DIAGNOSIS — N186 End stage renal disease: Secondary | ICD-10-CM | POA: Diagnosis not present

## 2019-05-27 DIAGNOSIS — E1122 Type 2 diabetes mellitus with diabetic chronic kidney disease: Secondary | ICD-10-CM | POA: Diagnosis not present

## 2019-05-30 ENCOUNTER — Other Ambulatory Visit (HOSPITAL_COMMUNITY)
Admission: RE | Admit: 2019-05-30 | Discharge: 2019-05-30 | Disposition: A | Discharge status: Home / Self Care | Payer: Medicare Other | Source: Ambulatory Visit | Attending: Vascular Surgery | Admitting: Vascular Surgery

## 2019-05-30 DIAGNOSIS — Z20828 Contact with and (suspected) exposure to other viral communicable diseases: Secondary | ICD-10-CM | POA: Insufficient documentation

## 2019-05-30 DIAGNOSIS — N186 End stage renal disease: Secondary | ICD-10-CM | POA: Diagnosis not present

## 2019-05-30 DIAGNOSIS — N2581 Secondary hyperparathyroidism of renal origin: Secondary | ICD-10-CM | POA: Diagnosis not present

## 2019-05-30 DIAGNOSIS — Z01812 Encounter for preprocedural laboratory examination: Secondary | ICD-10-CM | POA: Insufficient documentation

## 2019-05-30 DIAGNOSIS — D509 Iron deficiency anemia, unspecified: Secondary | ICD-10-CM | POA: Diagnosis not present

## 2019-05-30 DIAGNOSIS — Z992 Dependence on renal dialysis: Secondary | ICD-10-CM | POA: Diagnosis not present

## 2019-05-30 DIAGNOSIS — E1122 Type 2 diabetes mellitus with diabetic chronic kidney disease: Secondary | ICD-10-CM | POA: Diagnosis not present

## 2019-05-30 LAB — SARS CORONAVIRUS 2 (TAT 6-24 HRS): SARS Coronavirus 2: NEGATIVE

## 2019-06-01 ENCOUNTER — Encounter: Payer: Self-pay | Admitting: Vascular Surgery

## 2019-06-01 DIAGNOSIS — N2581 Secondary hyperparathyroidism of renal origin: Secondary | ICD-10-CM | POA: Diagnosis not present

## 2019-06-01 DIAGNOSIS — Z992 Dependence on renal dialysis: Secondary | ICD-10-CM | POA: Diagnosis not present

## 2019-06-01 DIAGNOSIS — D509 Iron deficiency anemia, unspecified: Secondary | ICD-10-CM | POA: Diagnosis not present

## 2019-06-01 DIAGNOSIS — I82492 Acute embolism and thrombosis of other specified deep vein of left lower extremity: Secondary | ICD-10-CM | POA: Diagnosis not present

## 2019-06-01 DIAGNOSIS — N186 End stage renal disease: Secondary | ICD-10-CM | POA: Diagnosis not present

## 2019-06-01 DIAGNOSIS — E1122 Type 2 diabetes mellitus with diabetic chronic kidney disease: Secondary | ICD-10-CM | POA: Diagnosis not present

## 2019-06-02 ENCOUNTER — Other Ambulatory Visit: Payer: Self-pay

## 2019-06-02 ENCOUNTER — Ambulatory Visit (HOSPITAL_COMMUNITY)
Admission: RE | Admit: 2019-06-02 | Discharge: 2019-06-02 | Disposition: A | Discharge status: Home / Self Care | Payer: Medicare Other | Attending: Vascular Surgery | Admitting: Vascular Surgery

## 2019-06-02 ENCOUNTER — Encounter (HOSPITAL_COMMUNITY)
Admission: RE | Disposition: A | Discharge status: Home / Self Care | Payer: Self-pay | Source: Home / Self Care | Attending: Vascular Surgery

## 2019-06-02 DIAGNOSIS — K219 Gastro-esophageal reflux disease without esophagitis: Secondary | ICD-10-CM | POA: Diagnosis not present

## 2019-06-02 DIAGNOSIS — E1143 Type 2 diabetes mellitus with diabetic autonomic (poly)neuropathy: Secondary | ICD-10-CM | POA: Insufficient documentation

## 2019-06-02 DIAGNOSIS — G5603 Carpal tunnel syndrome, bilateral upper limbs: Secondary | ICD-10-CM | POA: Insufficient documentation

## 2019-06-02 DIAGNOSIS — E1142 Type 2 diabetes mellitus with diabetic polyneuropathy: Secondary | ICD-10-CM | POA: Insufficient documentation

## 2019-06-02 DIAGNOSIS — Z940 Kidney transplant status: Secondary | ICD-10-CM | POA: Insufficient documentation

## 2019-06-02 DIAGNOSIS — I509 Heart failure, unspecified: Secondary | ICD-10-CM | POA: Diagnosis not present

## 2019-06-02 DIAGNOSIS — M109 Gout, unspecified: Secondary | ICD-10-CM | POA: Diagnosis not present

## 2019-06-02 DIAGNOSIS — Z79899 Other long term (current) drug therapy: Secondary | ICD-10-CM | POA: Diagnosis not present

## 2019-06-02 DIAGNOSIS — N2581 Secondary hyperparathyroidism of renal origin: Secondary | ICD-10-CM | POA: Diagnosis not present

## 2019-06-02 DIAGNOSIS — E559 Vitamin D deficiency, unspecified: Secondary | ICD-10-CM | POA: Insufficient documentation

## 2019-06-02 DIAGNOSIS — J449 Chronic obstructive pulmonary disease, unspecified: Secondary | ICD-10-CM | POA: Insufficient documentation

## 2019-06-02 DIAGNOSIS — Z6841 Body Mass Index (BMI) 40.0 and over, adult: Secondary | ICD-10-CM | POA: Insufficient documentation

## 2019-06-02 DIAGNOSIS — F4310 Post-traumatic stress disorder, unspecified: Secondary | ICD-10-CM | POA: Insufficient documentation

## 2019-06-02 DIAGNOSIS — K3184 Gastroparesis: Secondary | ICD-10-CM | POA: Diagnosis not present

## 2019-06-02 DIAGNOSIS — Z7901 Long term (current) use of anticoagulants: Secondary | ICD-10-CM | POA: Insufficient documentation

## 2019-06-02 DIAGNOSIS — Z885 Allergy status to narcotic agent status: Secondary | ICD-10-CM | POA: Diagnosis not present

## 2019-06-02 DIAGNOSIS — N186 End stage renal disease: Secondary | ICD-10-CM | POA: Insufficient documentation

## 2019-06-02 DIAGNOSIS — I351 Nonrheumatic aortic (valve) insufficiency: Secondary | ICD-10-CM | POA: Insufficient documentation

## 2019-06-02 DIAGNOSIS — E1122 Type 2 diabetes mellitus with diabetic chronic kidney disease: Secondary | ICD-10-CM | POA: Diagnosis not present

## 2019-06-02 DIAGNOSIS — E785 Hyperlipidemia, unspecified: Secondary | ICD-10-CM | POA: Diagnosis not present

## 2019-06-02 DIAGNOSIS — Z992 Dependence on renal dialysis: Secondary | ICD-10-CM | POA: Insufficient documentation

## 2019-06-02 DIAGNOSIS — G4733 Obstructive sleep apnea (adult) (pediatric): Secondary | ICD-10-CM | POA: Diagnosis not present

## 2019-06-02 DIAGNOSIS — Z794 Long term (current) use of insulin: Secondary | ICD-10-CM | POA: Diagnosis not present

## 2019-06-02 DIAGNOSIS — Z86718 Personal history of other venous thrombosis and embolism: Secondary | ICD-10-CM | POA: Insufficient documentation

## 2019-06-02 DIAGNOSIS — I132 Hypertensive heart and chronic kidney disease with heart failure and with stage 5 chronic kidney disease, or end stage renal disease: Secondary | ICD-10-CM | POA: Insufficient documentation

## 2019-06-02 DIAGNOSIS — Z8249 Family history of ischemic heart disease and other diseases of the circulatory system: Secondary | ICD-10-CM | POA: Insufficient documentation

## 2019-06-02 HISTORY — PX: UPPER EXTREMITY VENOGRAPHY: CATH118272

## 2019-06-02 LAB — BASIC METABOLIC PANEL
Anion gap: 15 (ref 5–15)
BUN: 32 mg/dL — ABNORMAL HIGH (ref 6–20)
CO2: 23 mmol/L (ref 22–32)
Calcium: 9 mg/dL (ref 8.9–10.3)
Chloride: 97 mmol/L — ABNORMAL LOW (ref 98–111)
Creatinine, Ser: 7.78 mg/dL — ABNORMAL HIGH (ref 0.61–1.24)
GFR calc Af Amer: 8 mL/min — ABNORMAL LOW (ref >60)
GFR calc non Af Amer: 7 mL/min — ABNORMAL LOW (ref >60)
Glucose, Bld: 184 mg/dL — ABNORMAL HIGH (ref 70–99)
Potassium: 4.9 mmol/L (ref 3.5–5.1)
Sodium: 135 mmol/L (ref 135–145)

## 2019-06-02 LAB — PROTIME-INR
INR: 2.6 — ABNORMAL HIGH (ref 0.8–1.2)
Prothrombin Time: 27.7 seconds — ABNORMAL HIGH (ref 11.4–15.2)

## 2019-06-02 LAB — POCT INR: INR: 2.8 (ref 2.0–3.0)

## 2019-06-02 SURGERY — UPPER EXTREMITY VENOGRAPHY
Anesthesia: LOCAL | Laterality: Bilateral

## 2019-06-02 MED ORDER — LIDOCAINE HCL (PF) 1 % IJ SOLN
INTRAMUSCULAR | Status: AC
  Filled 2019-06-02: qty 30

## 2019-06-02 MED ORDER — IODIXANOL 320 MG/ML IV SOLN
INTRAVENOUS | Status: DC | PRN
  Administered 2019-06-02: 70 mL via INTRAVENOUS

## 2019-06-02 MED ORDER — SODIUM CHLORIDE 0.9% FLUSH: 3.0000 mL | INTRAVENOUS | Status: DC | PRN

## 2019-06-02 MED ORDER — HEPARIN (PORCINE) IN NACL 1000-0.9 UT/500ML-% IV SOLN
INTRAVENOUS | Status: AC
  Filled 2019-06-02: qty 1000

## 2019-06-02 SURGICAL SUPPLY — 2 items
STOPCOCK MORSE 400PSI 3WAY (MISCELLANEOUS) ×4 IMPLANT
TUBING CIL FLEX 10 FLL-RA (TUBING) ×4 IMPLANT

## 2019-06-02 NOTE — Op Note (Signed)
Procedure: Bilateral central venogram  Preoperative diagnosis: End-stage renal disease  Postoperative diagnosis: Same  Anesthesia: None  Operative findings: Patent right central veins, occluded left subclavian vein with collateralization  Operative details: After team informed consent, the patient taken the [place] lab.  The patient was placed in supine position on the angios table.  A peripheral IV was accessed in the right arm.  Contrast was infused through this.  Images were obtained of the central venous circulation.  The right axillary subclavian innominate and superior vena cava are all patent.  There is a dialysis catheter obstructing a portion of the superior vena cava.  On the left side contrast injection was also performed for a left peripheral IV.  The proximal left axillary vein is patent.  However we were unable to get any contrast to opacify the left subclavian or innominate.  There were a large number of collaterals in this location suggestive of occlusion of the left subclavian and innominate vein.  At this point the procedure was concluded.  The patient was taken back to the holding area in stable condition.  Operative management: The patient will be scheduled for a right upper arm AV graft in the near future.  Ruta Hinds, MD [facility] [employer] Office: [phone] Pager: [phone]

## 2019-06-02 NOTE — Interval H&P Note (Signed)
History and Physical Interval Note:  06/02/2019 12:33 PM  [name] [name]  has presented today for surgery, with the diagnosis of renal disease.  The various methods of treatment have been discussed with the patient and family. After consideration of risks, benefits and other options for treatment, the patient has consented to  Procedure(s): UPPER EXTREMITY VENOGRAPHY (Bilateral) as a surgical intervention.  The patient's history has been reviewed, patient examined, no change in status, stable for surgery.  I have reviewed the patient's chart and labs.  Questions were answered to the patient's satisfaction.     Ruta Hinds

## 2019-06-02 NOTE — Progress Notes (Addendum)
Discharge instructions reviewed with pt. Voices understanding. [facility] called for transport.

## 2019-06-02 NOTE — Progress Notes (Signed)
I stat Potassium shows high Rennis Harding, RN called and informed and new orders note for [facility].

## 2019-06-02 NOTE — Discharge Instructions (Signed)
Venogram, Care After °This sheet gives you information about how to care for yourself after your procedure. Your health care provider may also give you more specific instructions. If you have problems or questions, contact your health care provider. °What can I expect after the procedure? °After the procedure, it is common to have: °· Bruising or mild discomfort in the area where the IV was inserted (insertion site). °Follow these instructions at home: °Eating and drinking ° °· Follow instructions from your health care provider about eating or drinking restrictions. °· Drink a lot of fluids for the first several days after the procedure, as directed by your health care provider. This helps to wash (flush) the contrast out of your body. Examples of healthy fluids include water or low-calorie drinks. °General instructions °· Check your IV insertion area every day for signs of infection. Check for: °? Redness, swelling, or pain. °? Fluid or blood. °? Warmth. °? Pus or a bad smell. °· Take over-the-counter and prescription medicines only as told by your health care provider. °· Rest and return to your normal activities as told by your health care provider. Ask your health care provider what activities are safe for you. °· Do not drive for 24 hours if you were given a medicine to help you relax (sedative), or until your health care provider approves. °· Keep all follow-up visits as told by your health care provider. This is important. °Contact a health care provider if: °· Your skin becomes itchy or you develop a rash or hives. °· You have a fever that does not get better with medicine. °· You feel nauseous. °· You vomit. °· You have redness, swelling, or pain around the insertion site. °· You have fluid or blood coming from the insertion site. °· Your insertion area feels warm to the touch. °· You have pus or a bad smell coming from the insertion site. °Get help right away if: °· You have difficulty breathing or  shortness of breath. °· You develop chest pain. °· You faint. °· You feel very dizzy. °These symptoms may represent a serious problem that is an emergency. Do not wait to see if the symptoms will go away. Get medical help right away. Call your local emergency services (911 in the [place] Do not drive yourself to the hospital. °Summary °· After your procedure, it is common to have bruising or mild discomfort in the area where the IV was inserted. °· You should check your IV insertion area every day for signs of infection. °· Take over-the-counter and prescription medicines only as told by your health care provider. °· You should drink a lot of fluids for the first several days after the procedure to help flush the contrast from your body. °This information is not intended to replace advice given to you by your health care provider. Make sure you discuss any questions you have with your health care provider. °Document Released: 07/19/2013 Document Revised: 09/10/2017 Document Reviewed: 08/22/2016 °Elsevier Patient Education © 2020 [employer] ° °

## 2019-06-02 NOTE — Interval H&P Note (Signed)
History and Physical Interval Note:  06/02/2019 12:17 PM  [name] [name]  has presented today for surgery, with the diagnosis of renal disease.  The various methods of treatment have been discussed with the patient and family. After consideration of risks, benefits and other options for treatment, the patient has consented to  Procedure(s): UPPER EXTREMITY VENOGRAPHY (Bilateral) as a surgical intervention.  The patient's history has been reviewed, patient examined, no change in status, stable for surgery.  I have reviewed the patient's chart and labs.  Questions were answered to the patient's satisfaction.     Ruta Hinds

## 2019-06-03 ENCOUNTER — Other Ambulatory Visit: Payer: Self-pay | Admitting: Endocrinology

## 2019-06-03 DIAGNOSIS — D509 Iron deficiency anemia, unspecified: Secondary | ICD-10-CM | POA: Diagnosis not present

## 2019-06-03 DIAGNOSIS — E1122 Type 2 diabetes mellitus with diabetic chronic kidney disease: Secondary | ICD-10-CM | POA: Diagnosis not present

## 2019-06-03 DIAGNOSIS — Z992 Dependence on renal dialysis: Secondary | ICD-10-CM | POA: Diagnosis not present

## 2019-06-03 DIAGNOSIS — N2581 Secondary hyperparathyroidism of renal origin: Secondary | ICD-10-CM | POA: Diagnosis not present

## 2019-06-03 DIAGNOSIS — N186 End stage renal disease: Secondary | ICD-10-CM | POA: Diagnosis not present

## 2019-06-05 ENCOUNTER — Encounter (HOSPITAL_COMMUNITY): Payer: Self-pay | Admitting: Vascular Surgery

## 2019-06-05 ENCOUNTER — Ambulatory Visit (INDEPENDENT_AMBULATORY_CARE_PROVIDER_SITE_OTHER): Payer: Medicare Other | Admitting: Endocrinology

## 2019-06-05 ENCOUNTER — Other Ambulatory Visit: Payer: Self-pay

## 2019-06-05 DIAGNOSIS — E1165 Type 2 diabetes mellitus with hyperglycemia: Secondary | ICD-10-CM

## 2019-06-05 DIAGNOSIS — Z794 Long term (current) use of insulin: Secondary | ICD-10-CM | POA: Diagnosis not present

## 2019-06-05 LAB — POCT I-STAT, CHEM 8
BUN: 51 mg/dL — ABNORMAL HIGH (ref 6–20)
Calcium, Ion: 0.91 mmol/L — ABNORMAL LOW (ref 1.15–1.40)
Chloride: 102 mmol/L (ref 98–111)
Creatinine, Ser: 8 mg/dL — ABNORMAL HIGH (ref 0.61–1.24)
Glucose, Bld: 178 mg/dL — ABNORMAL HIGH (ref 70–99)
HCT: 40 % (ref 39.0–52.0)
Hemoglobin: 13.6 g/dL (ref 13.0–17.0)
Potassium: 7.8 mmol/L (ref 3.5–5.1)
Sodium: 133 mmol/L — ABNORMAL LOW (ref 135–145)
TCO2: 27 mmol/L (ref 22–32)

## 2019-06-05 NOTE — Progress Notes (Signed)
Patient ID: [name] [name], male   DOB: Jan 12, 1966, 53 y.o.   MRN: [id number]           Reason for Appointment: Follow-up for Type 2 Diabetes  Today's office visit was provided via telemedicine using a telephone call to the patient Patient has been explained the limitations of evaluation and management by telemedicine and the availability of in person appointments.  The patient understood the limitations and agreed to proceed. Patient also understood that the telehealth visit is billable. . Location of the patient: Home . Location of the provider: Office Only the patient and myself were participating in the encounter  History of Present Illness:          Date of diagnosis of type 2 diabetes mellitus : 1990       Background history:  He has been taking insulin since about 1999, previously was on unknown oral agents He had good control with insulin initially but A1c was much higher in 2008 and subsequently has had required larger doses of insulin He has been on Lantus for the last few years along with Novolog,followed by an endocrinologist since 2008 His A1c was 6.3 in 04/2014   Prior to his consultation he was on a regimen of Novolog , 50 units 5 times a day with each meal and snack along with 90 Units 3 times a day of Lantus   Recent history:   INSULIN regimen is :TOUJEO 80 units twice daily  Humulin R U-500 insulin with KwikPen 50 units before breakfast, 70 before lunch and 50 before dinner  Previous Sliding scale use for high sugars: Sugar 150-199 take 2 units, 200-250= 4 units and over 250 take 6 units   Most recently A1C is 7.2, previously 7.4 done in 2019   Current blood sugar patterns and problems identified:   He has tried to improve his diet, he thinks previously was getting outside food and not balancing meals  Although he was supposed to check more readings after his evening meal also has not done any  Because of tendency to overnight hypoglycemia with blood sugars  as low as 45 was told to reduce his suppertime dose to 40 units but he is still taking 50 units  Does not think he has any overnight hypoglycemia  Currently only available readings are for the last 7 days and a few readings at lunch and dinner  FASTING blood sugars recently have been mostly fairly good with lowest reading 78  Most of his readings at lunch and dinner are however high except once midafternoon  Currently is not apparently adjusting his mealtime dose based on pre-meal blood sugar and not reducing it when it is low normal.  He is not clear in the conversation about whether he is adjusting his dose for high readings also  He is not able to understand how to adjust his mealtime dose based on his portion size or carbohydrate intake  He thinks he is taking his Humulin R before eating as directed    Compliance with the medical regimen: Inconsistent   Glucose monitoring:  done 0-2 times a day         Glucometer: Accu-Chek       Blood Glucose readings by download:   PRE-MEAL Fasting Lunch Dinner Bedtime Overall  Glucose range:  78-258  138-298  168-298 ?   Mean/median:     ?   Previous readings:  PRE-MEAL Fasting Lunch  late afternoon  overnight Overall  Glucose range:  121-316 ?  209-394  45-321  41-491  Mean/median:  160    122  155   POST-MEAL PC Breakfast PC Lunch PC Dinner  Glucose range:    43-191  Mean/median:    207      Self-care: The diet that the patient has been following is: tries to limit high-fat meals, usually eating  2-3 meals a day.     Typical meal intake: Breakfast is grits with pecans, olive oil. Dinner 5-6 pm                Dietician visit, most recent:2013               Exercise:  unable to do any, In wheelchair  Weight history:  Wt Readings from Last 3 Encounters:  06/02/19 291 lb 0.1 oz (132 kg)  05/24/19 (!) 303 lb (137.4 kg)  04/24/19 (!) 303 lb (137.4 kg)    Glycemic control:   Lab Results  Component Value Date   HGBA1C  7.2 (H) 04/17/2019   HGBA1C 7.4 (A) 07/05/2018   HGBA1C 7.9 (H) 10/20/2017   Lab Results  Component Value Date   MICROALBUR 232.9 (H) 06/19/2015   LDLCALC 34 04/17/2019   CREATININE 7.78 (H) 06/02/2019    Lab Results  Component Value Date   FRUCTOSAMINE 352 (H) 09/12/2018   FRUCTOSAMINE 408 (H) 07/23/2017   FRUCTOSAMINE 408 (H) 05/19/2017      Allergies as of 06/05/2019      Reactions   Pork-derived Products Anaphylaxis, Other (See Comments)   Fever also    Ace Inhibitors Cough   Hydrocodone Other (See Comments)   Hallucinations      Medication List       Accurate as of June 05, 2019  3:28 PM. If you have any questions, ask your nurse or doctor.        STOP taking these medications   doxycycline 100 MG tablet Commonly known as: VIBRA-TABS Stopped by: Elayne Snare, MD     TAKE these medications   allopurinol 100 MG tablet Commonly known as: ZYLOPRIM Take 1 tablet (100 mg total) by mouth 3 (three) times a week. After dialysis   ammonium lactate 12 % cream Commonly known as: AMLACTIN Apply topically as needed for dry skin.   calcium acetate 667 MG capsule Commonly known as: PHOSLO Take 3 capsules (2,001 mg total) by mouth 3 (three) times daily with meals.   clonazePAM 0.5 MG tablet Commonly known as: KLONOPIN   glucose blood test strip Commonly known as: Accu-Chek SmartView Use to test 4 times daily DX code E11.29   Accu-Chek Guide test strip Generic drug: glucose blood 1 each by Other route as needed for other. Use as instructed to check blood sugar 4 times daily. DX:E11.65   HumuLIN R U-500 KwikPen 500 UNIT/ML kwikpen Generic drug: insulin regular human CONCENTRATED INJECT SUBCUTANEOUSLY 50 UNITS BEFORE BREAKFAST, 70 UNITS FOR LUNCH, AND 40 UNITS FOR SUPPER. INJECT 30 MINUTES BEFORE EATING   hydrocortisone-pramoxine rectal foam Commonly known as: PROCTOFOAM-HC Place 1 applicator rectally 3 (three) times daily. Prn hemorrhoids   Insulin Glargine  300 UNIT/ML Sopn Commonly known as: Toujeo SoloStar Inject 70-80 Units into the skin 2 (two) times daily. Inject 80 units in the morning and inject 70 units in the evening. What changed:   how much to take  additional instructions   Insulin Pen Needle 32G X 8 MM Misc Use as directed   Droplet Pen Needles 32G X 4 MM Misc Generic drug: Insulin  Pen Needle USE  TO INJECT FIVE TIMES DAILY AS DIRECTED   midodrine 10 MG tablet Commonly known as: PROAMATINE Take 10 mg by mouth as needed (low blood pressure).   omeprazole 20 MG capsule Commonly known as: PRILOSEC TAKE 1 CAPSULE EVERY DAY   ONE TOUCH LANCETS Misc Use to test sugar 4 times daily   Ozempic (0.25 or 0.5 MG/DOSE) 2 MG/1.5ML Sopn Generic drug: Semaglutide(0.25 or 0.5MG/DOS)   Renvela 800 MG tablet Generic drug: sevelamer carbonate Take 2,400 mg by mouth 3 (three) times daily with meals.   rosuvastatin 20 MG tablet Commonly known as: CRESTOR TAKE 1 TABLET AT BEDTIME   silver sulfADIAZINE 1 % cream Commonly known as: Silvadene Apply 1 application topically daily.   Suvorexant 10 MG Tabs Commonly known as: Belsomra Take 10 mg by mouth at bedtime.   topiramate 100 MG tablet Commonly known as: TOPAMAX   triamcinolone cream 0.1 % Commonly known as: KENALOG Apply 1 application topically 2 (two) times daily.   Veltassa 8.4 g packet Generic drug: patiromer Take 8.4 g by mouth daily.   warfarin 5 MG tablet Commonly known as: COUMADIN Take as directed by the anticoagulation clinic. If you are unsure how to take this medication, talk to your nurse or doctor. Original instructions: Take 1 (5 mg) tablet every day except for Wednesday & Saturday when you take 7.67m (1.5 tablets) OR As directed.       Allergies:  Allergies  Allergen Reactions  . Pork-Derived Products Anaphylaxis and Other (See Comments)    Fever also   . Ace Inhibitors Cough  . Hydrocodone Other (See Comments)    Hallucinations    Past  Medical History:  Diagnosis Date  . Allergic rhinitis   . Anemia   . Aortic insufficiency    a. Echo 7/16:  Mod LVH, EF 50-55%, mild to mod AI, severe LAE, PASP 57 mmHg (LV ID end diastolic 532.9mm);  b. Echo 2/17:  Mod LVH, EF 50-55%, mod AI, MAC, mild MR, mod LAE, mild to mod TR, PASP 63 mmHg  . Bilateral carpal tunnel syndrome   . CHF (congestive heart failure) ([facility]   . Claustrophobia   . COPD (chronic obstructive pulmonary disease) ([facility]   . Diabetes mellitus    Type II  . Diabetic peripheral neuropathy ([facility]   . Diabetic retinopathy ([facility]   . Discitis of lumbar region    resolved  . DVT (deep venous thrombosis) ([facility] 10/07/2015   LLE  . Endocarditis 11/28/14  . ESRD (end stage renal disease) on dialysis ([facility]    s/p renal transplant in 2009, failed in 2016  . Gait disorder   . Gastroparesis   . GERD (gastroesophageal reflux disease)   . Gout   . Hearing difficulty   . Hyperlipidemia   . Hypertension   . Incomplete bladder emptying   . Leg pain 02/05/11   with walking  . Morbid obesity ([facility]   . Obstructive sleep apnea    not using CPAP- lost 100lbs  . Osteomyelitis ([facility]   . Posttraumatic stress disorder   . Rotator cuff disorder   . Secondary hyperparathyroidism ([facility]   . Sepsis ([facility]    Postive blood cultures -   . Urethral stricture   . Vitamin D deficiency     Past Surgical History:  Procedure Laterality Date  . ARTERIOVENOUS GRAFT PLACEMENT Bilateral "several"  . AV FISTULA PLACEMENT Bilateral 2015  . BASCILIC VEIN TRANSPOSITION Right 09/27/2015  . BASCILIC VEIN TRANSPOSITION  Right 09/27/2015   Procedure: BASILIC VEIN TRANSPOSITION right;  Surgeon: Mal Misty, MD;  Location: [facility];  Service: Vascular;  Laterality: Right;  . CATARACT EXTRACTION W/ INTRAOCULAR LENS  IMPLANT, BILATERAL Bilateral   . CYSTOSCOPY W/ INTERNAL URETHROTOMY  09/2014  . KIDNEY TRANSPLANT  2009  . UPPER EXTREMITY VENOGRAPHY Bilateral 06/02/2019   Procedure: UPPER EXTREMITY  VENOGRAPHY;  Surgeon: Elam Dutch, MD;  Location: [facility];  Service: Cardiovascular;  Laterality: Bilateral;    Family History  Problem Relation Age of Onset  . Hypertension Mother   . Diabetes Father     Social History:  reports that he has never smoked. He has never used smokeless tobacco. He reports that he does not drink alcohol or use drugs.    Review of Systems    ROS   Lipid history: He is on 20 mg Crestor with the following labs    Lab Results  Component Value Date   CHOL 83 04/17/2019   HDL 29.80 (L) 04/17/2019   LDLCALC 34 04/17/2019   TRIG 94.0 04/17/2019   CHOLHDL 3 04/17/2019            Hypertension: His blood pressure is managed by nephrologist and PCP   RENAL: He has had a renal transplant in 2009 and  he is on dialysis  Neurological: Has has had  decreased sensation in his feet, also complaining of tingling and burning Previously followed by neurologist  He has had marked decrease in his balance and also some weakness in his legs, using a power scooter  Last foot exam was in 02/2017: Absent monofilament sensation in his feet and toes and extending up to the middle of the lower leg on the left and up to the ankle on the right  LABS:  Admission on 06/02/2019, Discharged on 06/02/2019  Component Date Value Ref Range Status  . Prothrombin Time 06/02/2019 27.7* 11.4 - 15.2 seconds Final  . INR 06/02/2019 2.6* 0.8 - 1.2 Final   Comment: (NOTE) INR goal varies based on device and disease states. Performed at [facility], [place] [address], [place], [place] [address]   . Sodium 06/02/2019 135  135 - 145 mmol/L Final  . Potassium 06/02/2019 4.9  3.5 - 5.1 mmol/L Final  . Chloride 06/02/2019 97* 98 - 111 mmol/L Final  . CO2 06/02/2019 23  22 - 32 mmol/L Final  . Glucose, Bld 06/02/2019 184* 70 - 99 mg/dL Final  . BUN 06/02/2019 32* 6 - 20 mg/dL Final  . Creatinine, Ser 06/02/2019 7.78* 0.61 - 1.24 mg/dL Final  . Calcium 06/02/2019  9.0  8.9 - 10.3 mg/dL Final  . GFR calc non Af Amer 06/02/2019 7* >60 mL/min Final  . GFR calc Af Amer 06/02/2019 8* >60 mL/min Final  . Anion gap 06/02/2019 15  5 - 15 Final   Performed at [facility], [place] [address], [place], [place] [address]  . Sodium 06/02/2019 133* 135 - 145 mmol/L Final  . Potassium 06/02/2019 7.8* 3.5 - 5.1 mmol/L Final   Comment: SPECIMEN HEMOLYZED. HEMOLYSIS MAY AFFECT INTEGRITY OF RESULTS. REPEATED IN LAB Performed at [facility], [place] [address] [place], [place] [address]   . Chloride 06/02/2019 102  98 - 111 mmol/L Final  . BUN 06/02/2019 51* 6 - 20 mg/dL Final  . Creatinine, Ser 06/02/2019 8.00* 0.61 - 1.24 mg/dL Final  . Glucose, Bld 06/02/2019 178* 70 - 99 mg/dL Final  . Calcium,  Ion 06/02/2019 0.91* 1.15 - 1.40 mmol/L Final  . TCO2 06/02/2019 27  22 - 32 mmol/L Final  . Hemoglobin 06/02/2019 13.6  13.0 - 17.0 g/dL Final  . HCT 06/02/2019 40.0  39.0 - 52.0 % Final  Hospital Outpatient Visit on 05/30/2019  Component Date Value Ref Range Status  . SARS Coronavirus 2 05/30/2019 NEGATIVE  NEGATIVE Final   Comment: (NOTE) SARS-CoV-2 target nucleic acids are NOT DETECTED. The SARS-CoV-2 RNA is generally detectable in upper and lower respiratory specimens during the acute phase of infection. Negative results do not preclude SARS-CoV-2 infection, do not rule out co-infections with other pathogens, and should not be used as the sole basis for treatment or other patient management decisions. Negative results must be combined with clinical observations, patient history, and epidemiological information. The expected result is Negative. Fact Sheet for Patients: SugarRoll.be Fact Sheet for Healthcare Providers: https://www.woods-mathews.com/ This test is not yet approved or cleared by the [place] FDA and  has been authorized for detection and/or diagnosis of SARS-CoV-2 by FDA under an Emergency Use  Authorization (EUA). This EUA will remain  in effect (meaning this test can be used) for the duration of the COVID-19 declaration under Section 56                          4(b)(1) of the Act, 21 U.S.C. section 360bbb-3(b)(1), unless the authorization is terminated or revoked sooner. Performed at [facility], [place] [address], [place], [place] [address]     Physical Examination:  There were no vitals taken for this visit.     ASSESSMENT:  Diabetes type 2, long-standing with obesity  See history of present illness for detailed discussion of his current management, blood sugar patterns and problems identified.  His A1c was in July 7.2 However previously fructosamine is 352 which is relatively more accurate for him  His meter was not downloaded because of remote visit Also with lack of information about his postprandial readings in the evenings difficult to adjust his evening dose Blood sugars are less erratic than before but still mostly high at lunch and dinner He thinks he is improving his diet somewhat Most likely needs more insulin for breakfast and lunch However would be better if he can adjust his mealtime dose based on what he is eating as well as pre-meal blood sugar, currently not understanding how to do this     PLAN:   He will start checking his blood sugars after dinner on a regular basis and may not need to check it before lunch or dinner consistently  He will increase his Humulin R to 55 units in the morning and 75 before lunch  Reminded him to use the correction factor given previously for high readings  Also pre-meal blood sugar can be reduced by 5 units if his sugar is below 100  No change in Toujeo Call if having any unexpected hypoglycemia including overnight  There are no Patient Instructions on file for this visit.   Duration of telephone encounter =7-1/2 minutes   Elayne Snare 06/05/2019, 3:28 PM   Note: This office note was prepared with  Dragon voice recognition system technology. Any transcriptional errors that result from this process are unintentional.

## 2019-06-06 ENCOUNTER — Encounter: Payer: Self-pay | Admitting: *Deleted

## 2019-06-06 ENCOUNTER — Telehealth: Payer: Self-pay | Admitting: *Deleted

## 2019-06-06 ENCOUNTER — Other Ambulatory Visit: Payer: Self-pay | Admitting: *Deleted

## 2019-06-06 ENCOUNTER — Other Ambulatory Visit: Payer: Self-pay | Admitting: Endocrinology

## 2019-06-06 DIAGNOSIS — Z992 Dependence on renal dialysis: Secondary | ICD-10-CM | POA: Diagnosis not present

## 2019-06-06 DIAGNOSIS — E1122 Type 2 diabetes mellitus with diabetic chronic kidney disease: Secondary | ICD-10-CM | POA: Diagnosis not present

## 2019-06-06 DIAGNOSIS — N2581 Secondary hyperparathyroidism of renal origin: Secondary | ICD-10-CM | POA: Diagnosis not present

## 2019-06-06 DIAGNOSIS — D509 Iron deficiency anemia, unspecified: Secondary | ICD-10-CM | POA: Diagnosis not present

## 2019-06-06 DIAGNOSIS — N186 End stage renal disease: Secondary | ICD-10-CM | POA: Diagnosis not present

## 2019-06-06 MED ORDER — INSULIN PEN NEEDLE 32G X 8 MM MISC: 1 refills | Status: DC

## 2019-06-06 NOTE — Progress Notes (Signed)
Reviewed all pre-op instructions per pre-op letter. Coumadin hold x 3 days pre op and scheduled nasal swab for 06/08/2019 at [facility]. All instructions faxed to [facility] for patient . Verbalized understanding . To call if questions.

## 2019-06-06 NOTE — Telephone Encounter (Signed)
Faxed received by Jonelle Sidle at [facility]. Will review with patient.

## 2019-06-08 ENCOUNTER — Other Ambulatory Visit (HOSPITAL_COMMUNITY): Payer: Medicare Other

## 2019-06-08 DIAGNOSIS — N186 End stage renal disease: Secondary | ICD-10-CM | POA: Diagnosis not present

## 2019-06-08 DIAGNOSIS — N2581 Secondary hyperparathyroidism of renal origin: Secondary | ICD-10-CM | POA: Diagnosis not present

## 2019-06-08 DIAGNOSIS — Z992 Dependence on renal dialysis: Secondary | ICD-10-CM | POA: Diagnosis not present

## 2019-06-08 DIAGNOSIS — D509 Iron deficiency anemia, unspecified: Secondary | ICD-10-CM | POA: Diagnosis not present

## 2019-06-08 DIAGNOSIS — I82492 Acute embolism and thrombosis of other specified deep vein of left lower extremity: Secondary | ICD-10-CM | POA: Diagnosis not present

## 2019-06-08 DIAGNOSIS — E1122 Type 2 diabetes mellitus with diabetic chronic kidney disease: Secondary | ICD-10-CM | POA: Diagnosis not present

## 2019-06-09 ENCOUNTER — Ambulatory Visit (INDEPENDENT_AMBULATORY_CARE_PROVIDER_SITE_OTHER): Payer: Medicare Other | Admitting: General Practice

## 2019-06-09 ENCOUNTER — Other Ambulatory Visit (HOSPITAL_COMMUNITY)
Admission: RE | Admit: 2019-06-09 | Discharge: 2019-06-09 | Disposition: A | Discharge status: Home / Self Care | Payer: Medicare Other | Source: Ambulatory Visit | Attending: Vascular Surgery | Admitting: Vascular Surgery

## 2019-06-09 DIAGNOSIS — Z7901 Long term (current) use of anticoagulants: Secondary | ICD-10-CM | POA: Diagnosis not present

## 2019-06-09 DIAGNOSIS — Z01812 Encounter for preprocedural laboratory examination: Secondary | ICD-10-CM | POA: Insufficient documentation

## 2019-06-09 DIAGNOSIS — Z20828 Contact with and (suspected) exposure to other viral communicable diseases: Secondary | ICD-10-CM | POA: Insufficient documentation

## 2019-06-09 LAB — SARS CORONAVIRUS 2 (TAT 6-24 HRS): SARS Coronavirus 2: NEGATIVE

## 2019-06-09 NOTE — Patient Instructions (Signed)
Pre visit review using our clinic review tool, if applicable. No additional management support is needed unless otherwise documented below in the visit note.  Received fax on 8/28.  Continue to take 1 tablet daily. Dosing instructions left on VM.   Re-check in 4 weeks.

## 2019-06-09 NOTE — Progress Notes (Signed)
Medical treatment/procedure(s) were performed by non-physician practitioner and as supervising physician I was immediately available for consultation/collaboration. I agree with above. Cameren Odwyer A Jamea Robicheaux, MD  

## 2019-06-10 DIAGNOSIS — Z992 Dependence on renal dialysis: Secondary | ICD-10-CM | POA: Diagnosis not present

## 2019-06-10 DIAGNOSIS — N186 End stage renal disease: Secondary | ICD-10-CM | POA: Diagnosis not present

## 2019-06-10 DIAGNOSIS — D509 Iron deficiency anemia, unspecified: Secondary | ICD-10-CM | POA: Diagnosis not present

## 2019-06-10 DIAGNOSIS — E1122 Type 2 diabetes mellitus with diabetic chronic kidney disease: Secondary | ICD-10-CM | POA: Diagnosis not present

## 2019-06-10 DIAGNOSIS — N2581 Secondary hyperparathyroidism of renal origin: Secondary | ICD-10-CM | POA: Diagnosis not present

## 2019-06-12 ENCOUNTER — Telehealth: Payer: Self-pay | Admitting: Endocrinology

## 2019-06-12 DIAGNOSIS — Z992 Dependence on renal dialysis: Secondary | ICD-10-CM | POA: Diagnosis not present

## 2019-06-12 DIAGNOSIS — D509 Iron deficiency anemia, unspecified: Secondary | ICD-10-CM | POA: Diagnosis not present

## 2019-06-12 DIAGNOSIS — E1122 Type 2 diabetes mellitus with diabetic chronic kidney disease: Secondary | ICD-10-CM | POA: Diagnosis not present

## 2019-06-12 DIAGNOSIS — N2581 Secondary hyperparathyroidism of renal origin: Secondary | ICD-10-CM | POA: Diagnosis not present

## 2019-06-12 DIAGNOSIS — N186 End stage renal disease: Secondary | ICD-10-CM | POA: Diagnosis not present

## 2019-06-12 MED ORDER — DEXTROSE 5 % IV SOLN
3.0000 g | INTRAVENOUS | Status: AC
  Administered 2019-06-13: 3 g via INTRAVENOUS
  Filled 2019-06-12: qty 3
  Filled 2019-06-12: qty 3000

## 2019-06-12 NOTE — Telephone Encounter (Signed)
Henrine Screws nurse calling from [facility]  He is inquiring about the U500 and the toujeo  He is stating the toujeo is being cut in 1/2 for tonight and tomorrow due to the surgery for his fistula placement  [name] [name] is asking for [place] to make the determination about he u500 and if he should cut that down to half as well tonight and tomorrow # [phone]

## 2019-06-12 NOTE — Telephone Encounter (Signed)
Called Robby at short stay and informed him of MD instructions. He verbalized understanding of this.

## 2019-06-12 NOTE — Telephone Encounter (Signed)
Nurse wants to clarify that he should still take insulin before surgery tomorrow morning since it is meal time insulin and he will be NPO at midnight until after the surgery is over.

## 2019-06-12 NOTE — Telephone Encounter (Signed)
Would be fine to correct his U-500 insulin 50% of the evening before his surgery and the next morning

## 2019-06-12 NOTE — Progress Notes (Addendum)
We have not been able to get in touch with patient this afternoon.  I left patient a detailed message, instructing him to only take 1/2 of Insulin U 500 at dinner today and to take 1/2 of Toujeo tonight and in am if CBG is greater than 70. I instructed patient to check CBG after awaking and every 2 hours until arrival  to the hospital.  I Instructed patient if CBG is less than 70 to take 4 Glucose Tablets or 1 tube of Glucose Gel or 1/2 cup of a clear juice. Recheck CBG in 15 minutes then call pre- op desk at [phone] for further instructions. If scheduled to receive Insulin, do not take Insulin.  I was unable to reach patient by phone.  I left  A message on voice mail.  I instructed the patient to arrive at [facility] entrance at 5:30 am , register in the [facility]. DO NOT eat or drink anything after midnight:   I instructed the patient to take the following medications in the am with just enough water to get them down:Klonopin, Omeprazole, Topamax. I asked patient to not wear any lotions, powders, cologne, jewelry, piercing, make-up or nail polish.  Wear clean clothes. Brush teeth. I informed patient that there will need to be a driver and someone to stay with him/her for the first 24 hours after surgery.   I instructed  patient to call [phone], in the am if there were any questions or problems.

## 2019-06-12 NOTE — Progress Notes (Signed)
Left message for Dr. Dwyane Dee to advise on U500 tonight before surgery. I advised that I would be instructing patient per protocol to decrease Toujeo by 50% tonight and in the morning. I requested that Dr. Ronnie Derby office contact patient directly regarding same if Dr. Dwyane Dee felt his dose needed to be adjusted.

## 2019-06-12 NOTE — Anesthesia Preprocedure Evaluation (Addendum)
Anesthesia Evaluation  Patient identified by MRN, date of birth, ID band Patient awake    Reviewed: Allergy & Precautions, H&P , NPO status , Patient's Chart, lab work & pertinent test results  Airway Mallampati: II  TM Distance: >3 FB Neck ROM: Full    Dental no notable dental hx. (+) Teeth Intact   Pulmonary sleep apnea , COPD,    Pulmonary exam normal breath sounds clear to auscultation       Cardiovascular hypertension, Pt. on medications +CHF  Normal cardiovascular exam Rhythm:Regular Rate:Normal     Neuro/Psych Anxiety  Neuromuscular disease    GI/Hepatic GERD  ,  Endo/Other  diabetes  Renal/GU Renal disease     Musculoskeletal   Abdominal (+) + obese,   Peds  Hematology  (+) Blood dyscrasia, anemia ,   Anesthesia Other Findings   Reproductive/Obstetrics                            Anesthesia Physical Anesthesia Plan  ASA: IV  Anesthesia Plan: MAC   Post-op Pain Management:    Induction: Intravenous, Rapid sequence and Cricoid pressure planned  PONV Risk Score and Plan: 3 and Treatment may vary due to age or medical condition, Ondansetron and Promethazine  Airway Management Planned: Oral ETT  Additional Equipment:   Intra-op Plan:   Post-operative Plan: Extubation in OR  Informed Consent: I have reviewed the patients History and Physical, chart, labs and discussed the procedure including the risks, benefits and alternatives for the proposed anesthesia with the patient or authorized representative who has indicated his/her understanding and acceptance.     Dental advisory given  Plan Discussed with:   Anesthesia Plan Comments:         Anesthesia Quick Evaluation

## 2019-06-12 NOTE — Telephone Encounter (Signed)
He can leave it off in the morning and start back at the normal dose when he is eating

## 2019-06-13 ENCOUNTER — Ambulatory Visit (HOSPITAL_COMMUNITY): Payer: Medicare Other | Admitting: Anesthesiology

## 2019-06-13 ENCOUNTER — Other Ambulatory Visit: Payer: Self-pay

## 2019-06-13 ENCOUNTER — Encounter (HOSPITAL_COMMUNITY)
Admission: RE | Disposition: A | Discharge status: Home / Self Care | Payer: Self-pay | Source: Home / Self Care | Attending: Vascular Surgery

## 2019-06-13 ENCOUNTER — Ambulatory Visit (HOSPITAL_COMMUNITY)
Admission: RE | Admit: 2019-06-13 | Discharge: 2019-06-13 | Disposition: A | Discharge status: Home / Self Care | Payer: Medicare Other | Attending: Vascular Surgery | Admitting: Vascular Surgery

## 2019-06-13 ENCOUNTER — Encounter (HOSPITAL_COMMUNITY): Payer: Self-pay

## 2019-06-13 DIAGNOSIS — F419 Anxiety disorder, unspecified: Secondary | ICD-10-CM | POA: Diagnosis not present

## 2019-06-13 DIAGNOSIS — Z7901 Long term (current) use of anticoagulants: Secondary | ICD-10-CM | POA: Diagnosis not present

## 2019-06-13 DIAGNOSIS — Z86718 Personal history of other venous thrombosis and embolism: Secondary | ICD-10-CM | POA: Insufficient documentation

## 2019-06-13 DIAGNOSIS — Z992 Dependence on renal dialysis: Secondary | ICD-10-CM | POA: Diagnosis not present

## 2019-06-13 DIAGNOSIS — Z79899 Other long term (current) drug therapy: Secondary | ICD-10-CM | POA: Diagnosis not present

## 2019-06-13 DIAGNOSIS — Z940 Kidney transplant status: Secondary | ICD-10-CM | POA: Diagnosis not present

## 2019-06-13 DIAGNOSIS — E1122 Type 2 diabetes mellitus with diabetic chronic kidney disease: Secondary | ICD-10-CM | POA: Insufficient documentation

## 2019-06-13 DIAGNOSIS — E785 Hyperlipidemia, unspecified: Secondary | ICD-10-CM | POA: Insufficient documentation

## 2019-06-13 DIAGNOSIS — N185 Chronic kidney disease, stage 5: Secondary | ICD-10-CM | POA: Diagnosis not present

## 2019-06-13 DIAGNOSIS — K219 Gastro-esophageal reflux disease without esophagitis: Secondary | ICD-10-CM | POA: Insufficient documentation

## 2019-06-13 DIAGNOSIS — N2581 Secondary hyperparathyroidism of renal origin: Secondary | ICD-10-CM | POA: Diagnosis not present

## 2019-06-13 DIAGNOSIS — I509 Heart failure, unspecified: Secondary | ICD-10-CM | POA: Diagnosis not present

## 2019-06-13 DIAGNOSIS — Z794 Long term (current) use of insulin: Secondary | ICD-10-CM | POA: Insufficient documentation

## 2019-06-13 DIAGNOSIS — J449 Chronic obstructive pulmonary disease, unspecified: Secondary | ICD-10-CM | POA: Insufficient documentation

## 2019-06-13 DIAGNOSIS — I132 Hypertensive heart and chronic kidney disease with heart failure and with stage 5 chronic kidney disease, or end stage renal disease: Secondary | ICD-10-CM | POA: Diagnosis not present

## 2019-06-13 DIAGNOSIS — M109 Gout, unspecified: Secondary | ICD-10-CM | POA: Insufficient documentation

## 2019-06-13 DIAGNOSIS — N186 End stage renal disease: Secondary | ICD-10-CM | POA: Insufficient documentation

## 2019-06-13 DIAGNOSIS — E1142 Type 2 diabetes mellitus with diabetic polyneuropathy: Secondary | ICD-10-CM | POA: Diagnosis not present

## 2019-06-13 DIAGNOSIS — D631 Anemia in chronic kidney disease: Secondary | ICD-10-CM | POA: Diagnosis not present

## 2019-06-13 DIAGNOSIS — K3184 Gastroparesis: Secondary | ICD-10-CM | POA: Insufficient documentation

## 2019-06-13 DIAGNOSIS — G4733 Obstructive sleep apnea (adult) (pediatric): Secondary | ICD-10-CM | POA: Diagnosis not present

## 2019-06-13 DIAGNOSIS — D509 Iron deficiency anemia, unspecified: Secondary | ICD-10-CM | POA: Diagnosis not present

## 2019-06-13 DIAGNOSIS — E875 Hyperkalemia: Secondary | ICD-10-CM | POA: Diagnosis not present

## 2019-06-13 DIAGNOSIS — Z6841 Body Mass Index (BMI) 40.0 and over, adult: Secondary | ICD-10-CM | POA: Diagnosis not present

## 2019-06-13 HISTORY — PX: AV FISTULA PLACEMENT: SHX1204

## 2019-06-13 LAB — PROTIME-INR
INR: 1.5 — ABNORMAL HIGH (ref 0.8–1.2)
Prothrombin Time: 17.9 seconds — ABNORMAL HIGH (ref 11.4–15.2)

## 2019-06-13 LAB — POCT I-STAT 4, (NA,K, GLUC, HGB,HCT)
Glucose, Bld: 121 mg/dL — ABNORMAL HIGH (ref 70–99)
HCT: 40 % (ref 39.0–52.0)
Hemoglobin: 13.6 g/dL (ref 13.0–17.0)
Potassium: 5.5 mmol/L — ABNORMAL HIGH (ref 3.5–5.1)
Sodium: 136 mmol/L (ref 135–145)

## 2019-06-13 LAB — GLUCOSE, CAPILLARY
Glucose-Capillary: 116 mg/dL — ABNORMAL HIGH (ref 70–99)
Glucose-Capillary: 177 mg/dL — ABNORMAL HIGH (ref 70–99)

## 2019-06-13 SURGERY — INSERTION OF ARTERIOVENOUS (AV) GORE-TEX GRAFT ARM
Anesthesia: General | Site: Arm Upper | Laterality: Right

## 2019-06-13 MED ORDER — ONDANSETRON HCL 4 MG/2ML IJ SOLN
INTRAMUSCULAR | Status: DC | PRN
  Administered 2019-06-13: 4 mg via INTRAVENOUS

## 2019-06-13 MED ORDER — PROPOFOL 10 MG/ML IV BOLUS
INTRAVENOUS | Status: AC
  Filled 2019-06-13: qty 20

## 2019-06-13 MED ORDER — HYDROMORPHONE HCL 1 MG/ML IJ SOLN: 0.2500 mg | INTRAMUSCULAR | Status: DC | PRN

## 2019-06-13 MED ORDER — SODIUM CHLORIDE 0.9 % IV SOLN
INTRAVENOUS | Status: AC
  Filled 2019-06-13: qty 1.2

## 2019-06-13 MED ORDER — ROCURONIUM BROMIDE 10 MG/ML (PF) SYRINGE
PREFILLED_SYRINGE | INTRAVENOUS | Status: AC
  Filled 2019-06-13: qty 10

## 2019-06-13 MED ORDER — METOPROLOL TARTRATE 5 MG/5ML IV SOLN
INTRAVENOUS | Status: DC | PRN
  Administered 2019-06-13 (×2): 2.5 mg via INTRAVENOUS

## 2019-06-13 MED ORDER — ONDANSETRON HCL 4 MG/2ML IJ SOLN
INTRAMUSCULAR | Status: AC
  Filled 2019-06-13: qty 2

## 2019-06-13 MED ORDER — FENTANYL CITRATE (PF) 100 MCG/2ML IJ SOLN
INTRAMUSCULAR | Status: DC | PRN
  Administered 2019-06-13 (×4): 50 ug via INTRAVENOUS

## 2019-06-13 MED ORDER — DEXAMETHASONE SODIUM PHOSPHATE 10 MG/ML IJ SOLN
INTRAMUSCULAR | Status: AC
  Filled 2019-06-13: qty 1

## 2019-06-13 MED ORDER — CHLORHEXIDINE GLUCONATE 4 % EX LIQD: 60.0000 mL | Freq: Once | CUTANEOUS | Status: DC

## 2019-06-13 MED ORDER — CISATRACURIUM BESYLATE 20 MG/10ML IV SOLN
INTRAVENOUS | Status: AC
  Filled 2019-06-13: qty 20

## 2019-06-13 MED ORDER — OXYCODONE HCL 5 MG PO TABS: 5.0000 mg | ORAL_TABLET | Freq: Once | ORAL | Status: DC | PRN

## 2019-06-13 MED ORDER — PHENYLEPHRINE 40 MCG/ML (10ML) SYRINGE FOR IV PUSH (FOR BLOOD PRESSURE SUPPORT)
PREFILLED_SYRINGE | INTRAVENOUS | Status: AC
  Filled 2019-06-13: qty 10

## 2019-06-13 MED ORDER — DEXAMETHASONE SODIUM PHOSPHATE 10 MG/ML IJ SOLN
INTRAMUSCULAR | Status: DC | PRN
  Administered 2019-06-13: 4 mg via INTRAVENOUS

## 2019-06-13 MED ORDER — SODIUM CHLORIDE 0.9 % IV SOLN
0.2500 mg/kg/h | INTRAVENOUS | Status: AC
  Administered 2019-06-13: .25 mg/kg/h via INTRAVENOUS
  Filled 2019-06-13: qty 250

## 2019-06-13 MED ORDER — SODIUM CHLORIDE 0.9 % IV SOLN
INTRAVENOUS | Status: DC
  Administered 2019-06-13 (×2): via INTRAVENOUS

## 2019-06-13 MED ORDER — PROMETHAZINE HCL 25 MG/ML IJ SOLN
6.2500 mg | Freq: Once | INTRAMUSCULAR | Status: AC
  Administered 2019-06-13: 6.25 mg via INTRAVENOUS

## 2019-06-13 MED ORDER — 0.9 % SODIUM CHLORIDE (POUR BTL) OPTIME
TOPICAL | Status: DC | PRN
  Administered 2019-06-13: 1000 mL

## 2019-06-13 MED ORDER — EPHEDRINE SULFATE-NACL 50-0.9 MG/10ML-% IV SOSY
PREFILLED_SYRINGE | INTRAVENOUS | Status: DC | PRN
  Administered 2019-06-13: 10 mg via INTRAVENOUS
  Administered 2019-06-13: 5 mg via INTRAVENOUS

## 2019-06-13 MED ORDER — LIDOCAINE 2% (20 MG/ML) 5 ML SYRINGE
INTRAMUSCULAR | Status: AC
  Filled 2019-06-13: qty 5

## 2019-06-13 MED ORDER — SODIUM CHLORIDE (PF) 0.9 % IJ SOLN
INTRAMUSCULAR | Status: DC | PRN
  Administered 2019-06-13: 500 mL

## 2019-06-13 MED ORDER — SODIUM CHLORIDE 0.9 % IV SOLN
INTRAVENOUS | Status: DC | PRN
  Administered 2019-06-13: 10:00:00 via INTRAVENOUS
  Administered 2019-06-13: 60 ug/min via INTRAVENOUS

## 2019-06-13 MED ORDER — MIDAZOLAM HCL 5 MG/5ML IJ SOLN
INTRAMUSCULAR | Status: DC | PRN
  Administered 2019-06-13: 2 mg via INTRAVENOUS

## 2019-06-13 MED ORDER — HEMOSTATIC AGENTS (NO CHARGE) OPTIME
TOPICAL | Status: DC | PRN
  Administered 2019-06-13: 1 via TOPICAL

## 2019-06-13 MED ORDER — PHENYLEPHRINE 40 MCG/ML (10ML) SYRINGE FOR IV PUSH (FOR BLOOD PRESSURE SUPPORT)
PREFILLED_SYRINGE | INTRAVENOUS | Status: DC | PRN
  Administered 2019-06-13: 80 ug via INTRAVENOUS
  Administered 2019-06-13: 120 ug via INTRAVENOUS
  Administered 2019-06-13 (×4): 80 ug via INTRAVENOUS

## 2019-06-13 MED ORDER — ACETAMINOPHEN 10 MG/ML IV SOLN: 1000.0000 mg | Freq: Once | INTRAVENOUS | Status: DC | PRN

## 2019-06-13 MED ORDER — LIDOCAINE HCL (PF) 1 % IJ SOLN
INTRAMUSCULAR | Status: AC
  Filled 2019-06-13: qty 30

## 2019-06-13 MED ORDER — SUCCINYLCHOLINE CHLORIDE 200 MG/10ML IV SOSY
PREFILLED_SYRINGE | INTRAVENOUS | Status: AC
  Filled 2019-06-13: qty 10

## 2019-06-13 MED ORDER — OXYCODONE-ACETAMINOPHEN 5-325 MG PO TABS: 1.0000 | ORAL_TABLET | Freq: Four times a day (QID) | ORAL | 0 refills | Status: DC | PRN

## 2019-06-13 MED ORDER — ONDANSETRON HCL 4 MG/2ML IJ SOLN
4.0000 mg | Freq: Once | INTRAMUSCULAR | Status: AC | PRN
  Administered 2019-06-13: 4 mg via INTRAVENOUS

## 2019-06-13 MED ORDER — LIDOCAINE 2% (20 MG/ML) 5 ML SYRINGE
INTRAMUSCULAR | Status: DC | PRN
  Administered 2019-06-13: 100 mg via INTRAVENOUS

## 2019-06-13 MED ORDER — OXYCODONE HCL 5 MG/5ML PO SOLN: 5.0000 mg | Freq: Once | ORAL | Status: DC | PRN

## 2019-06-13 MED ORDER — FENTANYL CITRATE (PF) 250 MCG/5ML IJ SOLN
INTRAMUSCULAR | Status: AC
  Filled 2019-06-13: qty 5

## 2019-06-13 MED ORDER — PROPOFOL 10 MG/ML IV BOLUS
INTRAVENOUS | Status: DC | PRN
  Administered 2019-06-13: 50 mg via INTRAVENOUS
  Administered 2019-06-13: 30 mg via INTRAVENOUS
  Administered 2019-06-13: 20 mg via INTRAVENOUS
  Administered 2019-06-13: 150 mg via INTRAVENOUS

## 2019-06-13 MED ORDER — SUCCINYLCHOLINE CHLORIDE 200 MG/10ML IV SOSY
PREFILLED_SYRINGE | INTRAVENOUS | Status: DC | PRN
  Administered 2019-06-13: 140 mg via INTRAVENOUS

## 2019-06-13 MED ORDER — LIDOCAINE HCL (PF) 1 % IJ SOLN
INTRAMUSCULAR | Status: DC | PRN
  Administered 2019-06-13: 30 mL

## 2019-06-13 MED ORDER — METOPROLOL TARTRATE 5 MG/5ML IV SOLN
INTRAVENOUS | Status: AC
  Filled 2019-06-13: qty 5

## 2019-06-13 MED ORDER — MIDAZOLAM HCL 2 MG/2ML IJ SOLN
INTRAMUSCULAR | Status: AC
  Filled 2019-06-13: qty 2

## 2019-06-13 MED ORDER — PROMETHAZINE HCL 25 MG/ML IJ SOLN
INTRAMUSCULAR | Status: AC
  Filled 2019-06-13: qty 1

## 2019-06-13 MED ORDER — BIVALIRUDIN BOLUS VIA INFUSION - CUPID
INTRAVENOUS | Status: DC | PRN
  Administered 2019-06-13: 99 mg via INTRAVENOUS

## 2019-06-13 SURGICAL SUPPLY — 39 items
ARMBAND PINK RESTRICT EXTREMIT (MISCELLANEOUS) ×4 IMPLANT
CANISTER SUCT 3000ML PPV (MISCELLANEOUS) ×2 IMPLANT
CANNULA VESSEL 3MM 2 BLNT TIP (CANNULA) ×2 IMPLANT
CLIP VESOCCLUDE MED 6/CT (CLIP) ×2 IMPLANT
CLIP VESOCCLUDE SM WIDE 6/CT (CLIP) ×2 IMPLANT
COVER WAND RF STERILE (DRAPES) IMPLANT
DECANTER SPIKE VIAL GLASS SM (MISCELLANEOUS) ×2 IMPLANT
DERMABOND ADVANCED (GAUZE/BANDAGES/DRESSINGS) ×1
DERMABOND ADVANCED .7 DNX12 (GAUZE/BANDAGES/DRESSINGS) ×1 IMPLANT
ELECT REM PT RETURN 9FT ADLT (ELECTROSURGICAL) ×2
ELECTRODE REM PT RTRN 9FT ADLT (ELECTROSURGICAL) ×1 IMPLANT
GLOVE BIO SURGEON STRL SZ7.5 (GLOVE) ×2 IMPLANT
GLOVE BIOGEL PI IND STRL 6.5 (GLOVE) ×4 IMPLANT
GLOVE BIOGEL PI IND STRL 7.0 (GLOVE) ×1 IMPLANT
GLOVE BIOGEL PI IND STRL 7.5 (GLOVE) ×1 IMPLANT
GLOVE BIOGEL PI INDICATOR 6.5 (GLOVE) ×4
GLOVE BIOGEL PI INDICATOR 7.0 (GLOVE) ×1
GLOVE BIOGEL PI INDICATOR 7.5 (GLOVE) ×1
GLOVE ECLIPSE 7.0 STRL STRAW (GLOVE) ×2 IMPLANT
GLOVE ECLIPSE 7.5 STRL STRAW (GLOVE) ×2 IMPLANT
GOWN STRL REUS W/ TWL LRG LVL3 (GOWN DISPOSABLE) ×5 IMPLANT
GOWN STRL REUS W/TWL LRG LVL3 (GOWN DISPOSABLE) ×5
GRAFT GORETEX STRT 4-7X45 (Vascular Products) ×2 IMPLANT
HEMOSTAT SPONGE AVITENE ULTRA (HEMOSTASIS) ×2 IMPLANT
HOVERMATT SINGLE USE (MISCELLANEOUS) ×2 IMPLANT
KIT BASIN OR (CUSTOM PROCEDURE TRAY) ×2 IMPLANT
KIT TURNOVER KIT B (KITS) ×2 IMPLANT
NS IRRIG 1000ML POUR BTL (IV SOLUTION) ×2 IMPLANT
PACK CV ACCESS (CUSTOM PROCEDURE TRAY) ×2 IMPLANT
PAD ARMBOARD 7.5X6 YLW CONV (MISCELLANEOUS) ×4 IMPLANT
SUT PROLENE 5 0 C 1 36 (SUTURE) ×4 IMPLANT
SUT PROLENE 6 0 CC (SUTURE) ×8 IMPLANT
SUT VIC AB 3-0 SH 27 (SUTURE) ×2
SUT VIC AB 3-0 SH 27X BRD (SUTURE) ×2 IMPLANT
SUT VICRYL 4-0 PS2 18IN ABS (SUTURE) ×4 IMPLANT
SYR TOOMEY 50ML (SYRINGE) IMPLANT
TOWEL GREEN STERILE (TOWEL DISPOSABLE) ×2 IMPLANT
UNDERPAD 30X30 (UNDERPADS AND DIAPERS) ×2 IMPLANT
WATER STERILE IRR 1000ML POUR (IV SOLUTION) ×2 IMPLANT

## 2019-06-13 NOTE — Discharge Instructions (Signed)
° °  [facility] ° °Discharge Instructions ° °AV Fistula or Graft Surgery for Dialysis Access ° °Please refer to the following instructions for your post-procedure care. Your surgeon or physician assistant will discuss any changes with you. ° °Activity ° °You may drive the day following your surgery, if you are comfortable and no longer taking prescription pain medication. Resume full activity as the soreness in your incision resolves. ° °Bathing/Showering ° °You may shower after you go home. Keep your incision dry for 48 hours. Do not soak in a bathtub, hot tub, or swim until the incision heals completely. You may not shower if you have a hemodialysis catheter. ° °Incision Care ° °Clean your incision with mild soap and water after 48 hours. Pat the area dry with a clean towel. You do not need a bandage unless otherwise instructed. Do not apply any ointments or creams to your incision. You may have skin glue on your incision. Do not peel it off. It will come off on its own in about one week. Your arm may swell a bit after surgery. To reduce swelling use pillows to elevate your arm so it is above your heart. Your doctor will tell you if you need to lightly wrap your arm with an ACE bandage. ° °Diet ° °Resume your normal diet. There are not special food restrictions following this procedure. In order to heal from your surgery, it is CRITICAL to get adequate nutrition. Your body requires vitamins, minerals, and protein. Vegetables are the best source of vitamins and minerals. Vegetables also provide the perfect balance of protein. Processed food has little nutritional value, so try to avoid this. ° °Medications ° °Resume taking all of your medications. If your incision is causing pain, you may take over-the counter pain relievers such as acetaminophen (Tylenol). If you were prescribed a stronger pain medication, please be aware these medications can cause nausea and constipation. Prevent  nausea by taking the medication with a snack or meal. Avoid constipation by drinking plenty of fluids and eating foods with high amount of fiber, such as fruits, vegetables, and grains. Do not take Tylenol if you are taking prescription pain medications. ° ° ° ° °Follow up °Your surgeon may want to see you in the office following your access surgery. If so, this will be arranged at the time of your surgery. ° °Please call us immediately for any of the following conditions: ° °Increased pain, redness, drainage (pus) from your incision site °Fever of 101 degrees or higher °Severe or worsening pain at your incision site °Hand pain or numbness. ° °Reduce your risk of vascular disease: ° °Stop smoking. If you would like help, call [facility] at 1-800-QUIT-NOW (1-800-784-8669) or [facility] at 336-586-4000 ° °Manage your cholesterol °Maintain a desired weight °Control your diabetes °Keep your blood pressure down ° °Dialysis ° °It will take several weeks to several months for your new dialysis access to be ready for use. Your surgeon will determine when it is OK to use it. Your nephrologist will continue to direct your dialysis. You can continue to use your Permcath until your new access is ready for use. ° °If you have any questions, please call the office at [phone]. ° °

## 2019-06-13 NOTE — Interval H&P Note (Signed)
History and Physical Interval Note:  06/13/2019 7:25 AM  [name] [name]  has presented today for surgery, with the diagnosis of END STAGE RENAL DISEASE FOR HEMODIALYSIS ACCESS.  The various methods of treatment have been discussed with the patient and family. After consideration of risks, benefits and other options for treatment, the patient has consented to  Procedure(s): INSERTION OF ARTERIOVENOUS (AV) GORE-TEX GRAFT RIGHT  ARM (Right) as a surgical intervention.  The patient's history has been reviewed, patient examined, no change in status, stable for surgery.  I have reviewed the patient's chart and labs.  Questions were answered to the patient's satisfaction.     Ruta Hinds

## 2019-06-13 NOTE — Progress Notes (Signed)
Patient's credit cards and cashed taken to security by 2 nurses.  Cash counted in patient's room with patient, placed in security envelope and sealed with patient.  Security, Andrey Campanile, received envelope in security office.  Yellow carbon copy placed in patient's chart.  Mark, in PACU, notified that patient had an envelope that was in security.

## 2019-06-13 NOTE — Anesthesia Postprocedure Evaluation (Signed)
Anesthesia Post Note  Patient: [name] [name]  Procedure(s) Performed: INSERTION OF ARTERIOVENOUS (AV) GORE-TEX GRAFT RIGHT  ARM (Right Arm Upper)     Patient location during evaluation: PACU Anesthesia Type: General Level of consciousness: awake and alert Pain management: pain level controlled Vital Signs Assessment: post-procedure vital signs reviewed and stable Respiratory status: spontaneous breathing, nonlabored ventilation, respiratory function stable and patient connected to nasal cannula oxygen Cardiovascular status: blood pressure returned to baseline and stable Postop Assessment: no apparent nausea or vomiting Anesthetic complications: no    Last Vitals:  Vitals:   06/13/19 1330 06/13/19 1346  BP:  (!) 105/42  Pulse: (!) 109 (!) 108  Resp:    Temp:  (!) 36.2 C  SpO2: 98% 99%    Last Pain:  Vitals:   06/13/19 1300  TempSrc:   PainSc: Asleep                 Effie Berkshire

## 2019-06-13 NOTE — Anesthesia Procedure Notes (Signed)
Procedure Name: Intubation Date/Time: 06/13/2019 7:48 AM Performed by: Orlie Dakin, CRNA Pre-anesthesia Checklist: Patient identified, Emergency Drugs available, Suction available and Patient being monitored Patient Re-evaluated:Patient Re-evaluated prior to induction Oxygen Delivery Method: Circle system utilized Preoxygenation: Pre-oxygenation with 100% oxygen Induction Type: IV induction Laryngoscope Size: Miller and 3 Grade View: Grade I Tube type: Oral Tube size: 7.5 mm Number of attempts: 1 Airway Equipment and Method: Stylet Placement Confirmation: ETT inserted through vocal cords under direct vision,  positive ETCO2 and breath sounds checked- equal and bilateral Secured at: 24 cm Tube secured with: Tape Dental Injury: Teeth and Oropharynx as per pre-operative assessment  Comments: Coughing on DL, 4x4s bite block used for proc.

## 2019-06-13 NOTE — Progress Notes (Signed)
Pt has pre-existing right upper chest Diatek, both ports capped & clamped, dsg. CDI. 

## 2019-06-13 NOTE — Op Note (Signed)
Procedure: Right Upper Arm AV graft  Preop: ESRD  Postop: ESRD  Anesthesia: Local with sedation  Findings:4-7 mm PTFE end to side to axillary vein  Asst: Gerri Lins PA-C   Procedure Details: After commencing general anesthesia, the right upper extremity was prepped and draped in usual sterile fashion. A longitudinal incision was then made near the antecubital crease the left arm.  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  This was about 10 mm in diameter.  It was dissected free circumferentially.  The artery was then dissected free circumferentially above and below the anastomosis.  Vesseloops were placed around these.   The artery was  4 mm in diameter.  At this point, a longitudinal incision was made in the axilla and carried through the subcutaneous tissues and fascia to expose the axillary vein.  The previously placed fistula again was fairly large and in the way in the axilla.  Therefore I dissected it free circumferentially and mobilized about 3 or 4 cm of it.  I then transected it and oversewed the proximal and distal ends with a running 5-0 Prolene suture.  I was then able to find an additional axillary vein just deep to this.  It was about 4 mm in diameter.  The nerves were protected.  Next, a subcutaneous tunnel was created connecting the upper arm to the lower arm incision in an arcing configuration over the biceps muscle.  This was tunneled lateral to the old fistula.  A 4-7 mm PTFE graft was then brought through this subcutaneous tunnel. The patient was given a bolus of Angiomax followed by infusion.  This was due to the fact that the patient had a pork allergy. After appropriate circulation time, the vessel loops were used to control the artery.  Fistula was transected just above the old anastomosis.  The 4 mm end of the graft was beveled and sewn end to end to the vein cuff on the artery using a 6 0 prolene.  At completion of the anastomosis  the artery was forward bled, backbled and thoroughly flushed.  The anastomosis was secured, vessel loops were released and there was palpable pulse in the graft.  The graft was clamped just above the arterial anastomosis with a fistula clamp. The graft was then pulled taut to length at the axillary incision.  The axillary vein was controlled with a fine bulldog clamp in the upper axilla proximally and distally.  The vein was opened longitudinally.  The distal end of the graft was then beveled and sewn end to side to the vein using a running 6 0 prolene.  Just prior to completion of the anastomosis, everything was forward bled, back bled and thoroughly flushed.  The anastomosis was secured and the fistula clamp removed from the proximal graft.  A thrill was immediately palpable in the graft.  After hemostasis was obtained, the subcutaneous tissues were reapproximated using a running 3-0 Vicryl suture. The skin was then closed with a 4 0 Vicryl subcuticular stitch. Dermabond was applied to the skin incisions.  The patient tolerated the procedure well and there were no complications.  Instrument sponge and needle count was correct at the end of the case.  The patient was taken to the recovery room in stable condition.  Patient had an audible radial Doppler signal at the conclusion of the case.  Ruta Hinds, MD [facility] Office: [phone] Pager: [phone]

## 2019-06-13 NOTE — Transfer of Care (Signed)
Immediate Anesthesia Transfer of Care Note  Patient: [name] [name]  Procedure(s) Performed: INSERTION OF ARTERIOVENOUS (AV) GORE-TEX GRAFT RIGHT  ARM (Right Arm Upper)  Patient Location: PACU  Anesthesia Type:General  Level of Consciousness: awake and patient cooperative  Airway & Oxygen Therapy: Patient Spontanous Breathing and Patient connected to nasal cannula oxygen  Post-op Assessment: Report given to RN and Post -op Vital signs reviewed and stable  Post vital signs: Reviewed and stable  Last Vitals:  Vitals Value Taken Time  BP 123/68 06/13/19 1036  Temp    Pulse 84 06/13/19 1039  Resp 19 06/13/19 1039  SpO2 99 % 06/13/19 1039  Vitals shown include unvalidated device data.  Last Pain:  Vitals:   06/13/19 0624  TempSrc: Oral  PainSc:          Complications: No apparent anesthesia complications

## 2019-06-14 ENCOUNTER — Telehealth: Payer: Self-pay | Admitting: Podiatry

## 2019-06-14 ENCOUNTER — Encounter (HOSPITAL_COMMUNITY): Payer: Self-pay | Admitting: Vascular Surgery

## 2019-06-14 NOTE — Telephone Encounter (Signed)
Offered pt appointment with Dr. March Rummage at 1:30 pm on Thursday 03 September. Pt stated he just had surgery with another provider and he has an appointment with that provider around 12 noon tomorrow. Pt then just asked for cleaning supplies be shipped to him.

## 2019-06-14 NOTE — Telephone Encounter (Signed)
I spoke with Dr. Jacqualyn Posey concerning pt's new wound to left 1st toe and wound supplies. Dr. Jacqualyn Posey states pt could come in tomorrow at 8:00am to evaluate the left great toe wound, otherwise dressings can be supplied if pt can not come in tomorrow, and can be scheduled to be evaluated at an appt on 06/20/2019. Left message informing pt of Dr. Leigh Aurora request for him to come in tomorrow, and other offers of dressings and appts, and to call and confirm appt tomorrow or other assistance in scheduling.

## 2019-06-14 NOTE — Telephone Encounter (Signed)
Pt is requesting authorization to get cleaning supplies for wound on left great toe.

## 2019-06-14 NOTE — Telephone Encounter (Signed)
Pt called back and I offered to schedule him for tomorrow morning, 08 September at 8:00 am. Pt stated he had to see his surgeon at 8:00 am and would be there all day for them to figure out what is wrong with the graft that was placed yesterday. I told the pt Dr. Jacqualyn Posey wants to see him, so I scheduled the patient to be seen on Tuesday,  08 September at 7:45 am.

## 2019-06-15 DIAGNOSIS — E1122 Type 2 diabetes mellitus with diabetic chronic kidney disease: Secondary | ICD-10-CM | POA: Diagnosis not present

## 2019-06-15 DIAGNOSIS — N186 End stage renal disease: Secondary | ICD-10-CM | POA: Diagnosis not present

## 2019-06-15 DIAGNOSIS — E875 Hyperkalemia: Secondary | ICD-10-CM | POA: Diagnosis not present

## 2019-06-15 DIAGNOSIS — I82492 Acute embolism and thrombosis of other specified deep vein of left lower extremity: Secondary | ICD-10-CM | POA: Diagnosis not present

## 2019-06-15 DIAGNOSIS — D631 Anemia in chronic kidney disease: Secondary | ICD-10-CM | POA: Diagnosis not present

## 2019-06-15 DIAGNOSIS — D509 Iron deficiency anemia, unspecified: Secondary | ICD-10-CM | POA: Diagnosis not present

## 2019-06-15 DIAGNOSIS — N2581 Secondary hyperparathyroidism of renal origin: Secondary | ICD-10-CM | POA: Diagnosis not present

## 2019-06-15 DIAGNOSIS — Z992 Dependence on renal dialysis: Secondary | ICD-10-CM | POA: Diagnosis not present

## 2019-06-16 LAB — POCT INR: INR: 1.6 — AB (ref 2.0–3.0)

## 2019-06-17 DIAGNOSIS — E875 Hyperkalemia: Secondary | ICD-10-CM | POA: Diagnosis not present

## 2019-06-17 DIAGNOSIS — E1122 Type 2 diabetes mellitus with diabetic chronic kidney disease: Secondary | ICD-10-CM | POA: Diagnosis not present

## 2019-06-17 DIAGNOSIS — N2581 Secondary hyperparathyroidism of renal origin: Secondary | ICD-10-CM | POA: Diagnosis not present

## 2019-06-17 DIAGNOSIS — D509 Iron deficiency anemia, unspecified: Secondary | ICD-10-CM | POA: Diagnosis not present

## 2019-06-17 DIAGNOSIS — Z992 Dependence on renal dialysis: Secondary | ICD-10-CM | POA: Diagnosis not present

## 2019-06-17 DIAGNOSIS — N186 End stage renal disease: Secondary | ICD-10-CM | POA: Diagnosis not present

## 2019-06-19 DIAGNOSIS — N2581 Secondary hyperparathyroidism of renal origin: Secondary | ICD-10-CM | POA: Diagnosis not present

## 2019-06-19 DIAGNOSIS — N186 End stage renal disease: Secondary | ICD-10-CM | POA: Diagnosis not present

## 2019-06-19 DIAGNOSIS — E1122 Type 2 diabetes mellitus with diabetic chronic kidney disease: Secondary | ICD-10-CM | POA: Diagnosis not present

## 2019-06-19 DIAGNOSIS — Z992 Dependence on renal dialysis: Secondary | ICD-10-CM | POA: Diagnosis not present

## 2019-06-19 DIAGNOSIS — D509 Iron deficiency anemia, unspecified: Secondary | ICD-10-CM | POA: Diagnosis not present

## 2019-06-19 DIAGNOSIS — E875 Hyperkalemia: Secondary | ICD-10-CM | POA: Diagnosis not present

## 2019-06-20 ENCOUNTER — Telehealth: Payer: Self-pay | Admitting: *Deleted

## 2019-06-20 ENCOUNTER — Other Ambulatory Visit: Payer: Self-pay

## 2019-06-20 ENCOUNTER — Ambulatory Visit (INDEPENDENT_AMBULATORY_CARE_PROVIDER_SITE_OTHER): Payer: Medicare Other | Admitting: Podiatry

## 2019-06-20 VITALS — Temp 97.5°F

## 2019-06-20 DIAGNOSIS — E1149 Type 2 diabetes mellitus with other diabetic neurological complication: Secondary | ICD-10-CM

## 2019-06-20 DIAGNOSIS — L03032 Cellulitis of left toe: Secondary | ICD-10-CM | POA: Diagnosis not present

## 2019-06-20 DIAGNOSIS — L02612 Cutaneous abscess of left foot: Secondary | ICD-10-CM | POA: Diagnosis not present

## 2019-06-20 DIAGNOSIS — L84 Corns and callosities: Secondary | ICD-10-CM | POA: Diagnosis not present

## 2019-06-20 DIAGNOSIS — L97521 Non-pressure chronic ulcer of other part of left foot limited to breakdown of skin: Secondary | ICD-10-CM

## 2019-06-20 MED ORDER — MUPIROCIN 2 % EX OINT: 1.0000 "application " | TOPICAL_OINTMENT | Freq: Two times a day (BID) | CUTANEOUS | 2 refills | Status: DC

## 2019-06-20 MED ORDER — CLINDAMYCIN HCL 300 MG PO CAPS: 300.0000 mg | ORAL_CAPSULE | Freq: Three times a day (TID) | ORAL | 0 refills | Status: DC

## 2019-06-20 NOTE — Progress Notes (Signed)
Subjective: 53 year old male presents the office today for concerns of a wound to the left foot which started last week.  We offered an appointment, last week but he states that he had a surgery was not able to come in.  He is not sure how the wound started on the left foot.  He noticed that there was bleeding and blood on the floor at his house when he noticed the wound.  He denies any pus.  He has been washing with soap and water and putting the bandage.Denies any systemic complaints such as fevers, chills, nausea, vomiting. No acute changes since last appointment, and no other complaints at this time.   Objective: AAO x3, NAD DP/PT pulses palpable bilaterally, CRT less than 3 seconds Ulceration present in the plantar aspect the left hallux.  There is some hyperkeratotic skin adjacent to this.  Upon debridement there is an ulceration measuring 1.5 x 1 cm and is superficial with a granular wound base.  There is mild edema and warmth of the toe.  There is no fluctuation crepitation.  No overt cellulitis is present.  No other open lesions identified there is a pre-ulcerative callus on the right hallux.  There is no ongoing ulceration upon debridement. No open lesions or pre-ulcerative lesions.  No pain with calf compression, swelling, warmth, erythema  Assessment: Ulceration left foot, looks improved  Plan: -All treatment options discussed with the patient including all alternatives, risks, complications.  -Patient debrided the wound today utilizing a 312 with scalpel without any complications, healthy tissue.  Antibiotic ointment was applied.  Mupirocin ointment as well as clindamycin.  Monitoring signs or symptoms are worsening infection.  Offloading at all times. -Debrided the pre-ulcerative callus on the right foot any complications or bleeding. -Patient encouraged to call the office with any questions, concerns, change in symptoms.   RTC 10 days or sooner if needed  [name] [name]  DPM

## 2019-06-20 NOTE — Telephone Encounter (Signed)
Dr. Jacqualyn Posey ordered 60 day supply of sterile gauze, conforming roll gauze and tape for daily use to left foot ulcer L97.521, L03.032, L02.521, from [facility]. Faxed orders, demographics and LOV demographics 06/20/2019 to [facility].

## 2019-06-22 DIAGNOSIS — I82492 Acute embolism and thrombosis of other specified deep vein of left lower extremity: Secondary | ICD-10-CM | POA: Diagnosis not present

## 2019-06-22 DIAGNOSIS — N2581 Secondary hyperparathyroidism of renal origin: Secondary | ICD-10-CM | POA: Diagnosis not present

## 2019-06-22 DIAGNOSIS — M109 Gout, unspecified: Secondary | ICD-10-CM | POA: Diagnosis not present

## 2019-06-22 DIAGNOSIS — D509 Iron deficiency anemia, unspecified: Secondary | ICD-10-CM | POA: Diagnosis not present

## 2019-06-22 DIAGNOSIS — E1122 Type 2 diabetes mellitus with diabetic chronic kidney disease: Secondary | ICD-10-CM | POA: Diagnosis not present

## 2019-06-22 DIAGNOSIS — Z992 Dependence on renal dialysis: Secondary | ICD-10-CM | POA: Diagnosis not present

## 2019-06-22 DIAGNOSIS — E875 Hyperkalemia: Secondary | ICD-10-CM | POA: Diagnosis not present

## 2019-06-22 DIAGNOSIS — N186 End stage renal disease: Secondary | ICD-10-CM | POA: Diagnosis not present

## 2019-06-23 ENCOUNTER — Encounter: Payer: Medicare Other | Admitting: Vascular Surgery

## 2019-06-23 ENCOUNTER — Ambulatory Visit (INDEPENDENT_AMBULATORY_CARE_PROVIDER_SITE_OTHER): Payer: Medicare Other | Admitting: General Practice

## 2019-06-23 ENCOUNTER — Encounter (HOSPITAL_COMMUNITY): Payer: Medicare Other

## 2019-06-23 ENCOUNTER — Other Ambulatory Visit (HOSPITAL_COMMUNITY): Payer: Medicare Other

## 2019-06-23 DIAGNOSIS — Z7901 Long term (current) use of anticoagulants: Secondary | ICD-10-CM | POA: Diagnosis not present

## 2019-06-23 LAB — POCT INR: INR: 3.5 — AB (ref 2.0–3.0)

## 2019-06-23 NOTE — Patient Instructions (Signed)
Pre visit review using our clinic review tool, if applicable. No additional management support is needed unless otherwise documented below in the visit note.  Received fax on 9/11.  Please take 1 1/2 tablets today and tomorrow and then continue to take 1 tablet daily. Dosing instructions left on VM.   Re-check in 4 weeks.

## 2019-06-24 DIAGNOSIS — N186 End stage renal disease: Secondary | ICD-10-CM | POA: Diagnosis not present

## 2019-06-24 DIAGNOSIS — Z992 Dependence on renal dialysis: Secondary | ICD-10-CM | POA: Diagnosis not present

## 2019-06-24 DIAGNOSIS — D509 Iron deficiency anemia, unspecified: Secondary | ICD-10-CM | POA: Diagnosis not present

## 2019-06-24 DIAGNOSIS — E1122 Type 2 diabetes mellitus with diabetic chronic kidney disease: Secondary | ICD-10-CM | POA: Diagnosis not present

## 2019-06-24 DIAGNOSIS — E875 Hyperkalemia: Secondary | ICD-10-CM | POA: Diagnosis not present

## 2019-06-24 DIAGNOSIS — N2581 Secondary hyperparathyroidism of renal origin: Secondary | ICD-10-CM | POA: Diagnosis not present

## 2019-06-27 ENCOUNTER — Ambulatory Visit (INDEPENDENT_AMBULATORY_CARE_PROVIDER_SITE_OTHER): Payer: Medicare Other | Admitting: General Practice

## 2019-06-27 DIAGNOSIS — N186 End stage renal disease: Secondary | ICD-10-CM | POA: Diagnosis not present

## 2019-06-27 DIAGNOSIS — E875 Hyperkalemia: Secondary | ICD-10-CM | POA: Diagnosis not present

## 2019-06-27 DIAGNOSIS — I82502 Chronic embolism and thrombosis of unspecified deep veins of left lower extremity: Secondary | ICD-10-CM | POA: Diagnosis not present

## 2019-06-27 DIAGNOSIS — E1122 Type 2 diabetes mellitus with diabetic chronic kidney disease: Secondary | ICD-10-CM | POA: Diagnosis not present

## 2019-06-27 DIAGNOSIS — D509 Iron deficiency anemia, unspecified: Secondary | ICD-10-CM | POA: Diagnosis not present

## 2019-06-27 DIAGNOSIS — Z992 Dependence on renal dialysis: Secondary | ICD-10-CM | POA: Diagnosis not present

## 2019-06-27 DIAGNOSIS — Z7901 Long term (current) use of anticoagulants: Secondary | ICD-10-CM

## 2019-06-27 DIAGNOSIS — N2581 Secondary hyperparathyroidism of renal origin: Secondary | ICD-10-CM | POA: Diagnosis not present

## 2019-06-27 NOTE — Patient Instructions (Signed)
Pre visit review using our clinic review tool, if applicable. No additional management support is needed unless otherwise documented below in the visit note.  Received fax on 9/15.  Please hold coumadin today (9/15) and then continue to take 1 tablet daily. Dosing instructions left on VM.   Re-check in 1 to 2 weeks at dialysis

## 2019-06-27 NOTE — Progress Notes (Signed)
Medical treatment/procedure(s) were performed by non-physician practitioner and as supervising physician I was immediately available for consultation/collaboration. I agree with above. Elizabeth A Crawford, MD  

## 2019-06-29 DIAGNOSIS — N186 End stage renal disease: Secondary | ICD-10-CM | POA: Diagnosis not present

## 2019-06-29 DIAGNOSIS — Z992 Dependence on renal dialysis: Secondary | ICD-10-CM | POA: Diagnosis not present

## 2019-06-29 DIAGNOSIS — E1122 Type 2 diabetes mellitus with diabetic chronic kidney disease: Secondary | ICD-10-CM | POA: Diagnosis not present

## 2019-06-29 DIAGNOSIS — D509 Iron deficiency anemia, unspecified: Secondary | ICD-10-CM | POA: Diagnosis not present

## 2019-06-29 DIAGNOSIS — I82492 Acute embolism and thrombosis of other specified deep vein of left lower extremity: Secondary | ICD-10-CM | POA: Diagnosis not present

## 2019-06-29 DIAGNOSIS — N2581 Secondary hyperparathyroidism of renal origin: Secondary | ICD-10-CM | POA: Diagnosis not present

## 2019-06-29 DIAGNOSIS — E875 Hyperkalemia: Secondary | ICD-10-CM | POA: Diagnosis not present

## 2019-06-30 LAB — PROTIME-INR: INR: 3 — AB (ref <=1.1)

## 2019-07-01 DIAGNOSIS — Z992 Dependence on renal dialysis: Secondary | ICD-10-CM | POA: Diagnosis not present

## 2019-07-01 DIAGNOSIS — N2581 Secondary hyperparathyroidism of renal origin: Secondary | ICD-10-CM | POA: Diagnosis not present

## 2019-07-01 DIAGNOSIS — E1122 Type 2 diabetes mellitus with diabetic chronic kidney disease: Secondary | ICD-10-CM | POA: Diagnosis not present

## 2019-07-01 DIAGNOSIS — N186 End stage renal disease: Secondary | ICD-10-CM | POA: Diagnosis not present

## 2019-07-01 DIAGNOSIS — E875 Hyperkalemia: Secondary | ICD-10-CM | POA: Diagnosis not present

## 2019-07-01 DIAGNOSIS — D509 Iron deficiency anemia, unspecified: Secondary | ICD-10-CM | POA: Diagnosis not present

## 2019-07-03 ENCOUNTER — Other Ambulatory Visit: Payer: Self-pay

## 2019-07-03 ENCOUNTER — Ambulatory Visit (INDEPENDENT_AMBULATORY_CARE_PROVIDER_SITE_OTHER): Payer: Medicare Other | Admitting: Podiatry

## 2019-07-03 ENCOUNTER — Encounter: Payer: Self-pay | Admitting: Podiatry

## 2019-07-03 DIAGNOSIS — L97521 Non-pressure chronic ulcer of other part of left foot limited to breakdown of skin: Secondary | ICD-10-CM | POA: Diagnosis not present

## 2019-07-03 DIAGNOSIS — E1149 Type 2 diabetes mellitus with other diabetic neurological complication: Secondary | ICD-10-CM

## 2019-07-04 DIAGNOSIS — N2581 Secondary hyperparathyroidism of renal origin: Secondary | ICD-10-CM | POA: Diagnosis not present

## 2019-07-04 DIAGNOSIS — D509 Iron deficiency anemia, unspecified: Secondary | ICD-10-CM | POA: Diagnosis not present

## 2019-07-04 DIAGNOSIS — N186 End stage renal disease: Secondary | ICD-10-CM | POA: Diagnosis not present

## 2019-07-04 DIAGNOSIS — E875 Hyperkalemia: Secondary | ICD-10-CM | POA: Diagnosis not present

## 2019-07-04 DIAGNOSIS — Z992 Dependence on renal dialysis: Secondary | ICD-10-CM | POA: Diagnosis not present

## 2019-07-04 DIAGNOSIS — E1122 Type 2 diabetes mellitus with diabetic chronic kidney disease: Secondary | ICD-10-CM | POA: Diagnosis not present

## 2019-07-04 NOTE — Progress Notes (Addendum)
Subjective: 53 year old male presents the office today for follow-up evaluation of ulcer of the distal aspect the left big toe.  He states he is doing much better.  He has been keeping antibiotic ointment, mupirocin ointment on the wound daily.  He has occasional bleeding but no pus.  No swelling or redness.  No new wounds.  No other concerns.Denies any systemic complaints such as fevers, chills, nausea, vomiting. No acute changes since last appointment, and no other complaints at this time.   Objective: AAO x3, NAD DP/PT pulses palpable bilaterally, CRT less than 3 seconds Ulceration present distal aspect of left hallux.  Superficial granular wound base measuring 0.5 x 0.5 x 0.1cm.  No probing, undermining or tunneling.  No fluctuation capitation.  There is no malodor. No pain with calf compression, swelling, warmth, erythema  Assessment: Left hallux ulceration with improvement  Plan: -All treatment options discussed with the patient including all alternatives, risks, complications.  -Sharply debrided the wound today utilizing a 312 scalpel without any complications, healthy, granular tissue.  Continue mupirocin ointment dressing changes daily.  Offloading at all times. -Monitor for any clinical signs or symptoms of infection and directed to call the office immediately should any occur or go to the ER. -Patient encouraged to call the office with any questions, concerns, change in symptoms.   Trula Slade DPM

## 2019-07-05 ENCOUNTER — Other Ambulatory Visit: Payer: Self-pay

## 2019-07-05 MED ORDER — OMEPRAZOLE 20 MG PO CPDR: 20.0000 mg | DELAYED_RELEASE_CAPSULE | Freq: Every day | ORAL | 0 refills | Status: DC

## 2019-07-05 MED ORDER — WARFARIN SODIUM 5 MG PO TABS: ORAL_TABLET | ORAL | 0 refills | Status: DC

## 2019-07-05 MED ORDER — ROSUVASTATIN CALCIUM 20 MG PO TABS: 20.0000 mg | ORAL_TABLET | Freq: Every day | ORAL | 0 refills | Status: DC

## 2019-07-06 DIAGNOSIS — I82492 Acute embolism and thrombosis of other specified deep vein of left lower extremity: Secondary | ICD-10-CM | POA: Diagnosis not present

## 2019-07-06 DIAGNOSIS — E1122 Type 2 diabetes mellitus with diabetic chronic kidney disease: Secondary | ICD-10-CM | POA: Diagnosis not present

## 2019-07-06 DIAGNOSIS — N2581 Secondary hyperparathyroidism of renal origin: Secondary | ICD-10-CM | POA: Diagnosis not present

## 2019-07-06 DIAGNOSIS — E875 Hyperkalemia: Secondary | ICD-10-CM | POA: Diagnosis not present

## 2019-07-06 DIAGNOSIS — D509 Iron deficiency anemia, unspecified: Secondary | ICD-10-CM | POA: Diagnosis not present

## 2019-07-06 DIAGNOSIS — N186 End stage renal disease: Secondary | ICD-10-CM | POA: Diagnosis not present

## 2019-07-06 DIAGNOSIS — Z992 Dependence on renal dialysis: Secondary | ICD-10-CM | POA: Diagnosis not present

## 2019-07-08 DIAGNOSIS — N186 End stage renal disease: Secondary | ICD-10-CM | POA: Diagnosis not present

## 2019-07-08 DIAGNOSIS — N2581 Secondary hyperparathyroidism of renal origin: Secondary | ICD-10-CM | POA: Diagnosis not present

## 2019-07-08 DIAGNOSIS — D509 Iron deficiency anemia, unspecified: Secondary | ICD-10-CM | POA: Diagnosis not present

## 2019-07-08 DIAGNOSIS — E875 Hyperkalemia: Secondary | ICD-10-CM | POA: Diagnosis not present

## 2019-07-08 DIAGNOSIS — Z992 Dependence on renal dialysis: Secondary | ICD-10-CM | POA: Diagnosis not present

## 2019-07-08 DIAGNOSIS — E1122 Type 2 diabetes mellitus with diabetic chronic kidney disease: Secondary | ICD-10-CM | POA: Diagnosis not present

## 2019-07-11 ENCOUNTER — Other Ambulatory Visit: Payer: Self-pay

## 2019-07-11 ENCOUNTER — Ambulatory Visit (INDEPENDENT_AMBULATORY_CARE_PROVIDER_SITE_OTHER): Payer: Medicare Other | Admitting: General Practice

## 2019-07-11 DIAGNOSIS — N186 End stage renal disease: Secondary | ICD-10-CM | POA: Diagnosis not present

## 2019-07-11 DIAGNOSIS — E1122 Type 2 diabetes mellitus with diabetic chronic kidney disease: Secondary | ICD-10-CM | POA: Diagnosis not present

## 2019-07-11 DIAGNOSIS — N2581 Secondary hyperparathyroidism of renal origin: Secondary | ICD-10-CM | POA: Diagnosis not present

## 2019-07-11 DIAGNOSIS — Z992 Dependence on renal dialysis: Secondary | ICD-10-CM | POA: Diagnosis not present

## 2019-07-11 DIAGNOSIS — Z7901 Long term (current) use of anticoagulants: Secondary | ICD-10-CM

## 2019-07-11 DIAGNOSIS — E875 Hyperkalemia: Secondary | ICD-10-CM | POA: Diagnosis not present

## 2019-07-11 DIAGNOSIS — D509 Iron deficiency anemia, unspecified: Secondary | ICD-10-CM | POA: Diagnosis not present

## 2019-07-11 NOTE — Patient Instructions (Signed)
Pre visit review using our clinic review tool, if applicable. No additional management support is needed unless otherwise documented below in the visit note.  Received fax on 9/29.  Continue to take 1 tablet daily. Dosing instructions left on VM.   Re-check in 1 to 2 weeks at dialysis

## 2019-07-11 NOTE — Progress Notes (Signed)
Medical screening examination/treatment/procedure(s) were performed by non-physician practitioner and as supervising physician I was immediately available for consultation/collaboration. I agree with above. Eligah Anello, MD   

## 2019-07-11 NOTE — Telephone Encounter (Signed)
Received a fax request for medication but did not see last refill in controlled database

## 2019-07-12 ENCOUNTER — Telehealth: Payer: Self-pay | Admitting: Internal Medicine

## 2019-07-12 NOTE — Telephone Encounter (Signed)
We have not prescribed this for him since 2017 and it has been almost 1 year since we have seen him so would need visit.

## 2019-07-12 NOTE — Telephone Encounter (Signed)
rx refill clonazePAM (KLONOPIN) 0.5 MG tablet  [facility] - [place], [place] [phone] (Phone) [phone] (Fax)

## 2019-07-12 NOTE — Telephone Encounter (Signed)
Left detailed message informing pt to schedule OV with PCP.

## 2019-07-12 NOTE — Telephone Encounter (Signed)
Denied. Needs OV to discuss the need for rx. See refill encounter. Left message for patient to call back to schedule OV per PCP.

## 2019-07-13 DIAGNOSIS — Z992 Dependence on renal dialysis: Secondary | ICD-10-CM | POA: Diagnosis not present

## 2019-07-13 DIAGNOSIS — E875 Hyperkalemia: Secondary | ICD-10-CM | POA: Diagnosis not present

## 2019-07-13 DIAGNOSIS — T782XXS Anaphylactic shock, unspecified, sequela: Secondary | ICD-10-CM | POA: Diagnosis not present

## 2019-07-13 DIAGNOSIS — N2581 Secondary hyperparathyroidism of renal origin: Secondary | ICD-10-CM | POA: Diagnosis not present

## 2019-07-13 DIAGNOSIS — E1122 Type 2 diabetes mellitus with diabetic chronic kidney disease: Secondary | ICD-10-CM | POA: Diagnosis not present

## 2019-07-13 DIAGNOSIS — N186 End stage renal disease: Secondary | ICD-10-CM | POA: Diagnosis not present

## 2019-07-13 DIAGNOSIS — I82492 Acute embolism and thrombosis of other specified deep vein of left lower extremity: Secondary | ICD-10-CM | POA: Diagnosis not present

## 2019-07-13 DIAGNOSIS — E8770 Fluid overload, unspecified: Secondary | ICD-10-CM | POA: Diagnosis not present

## 2019-07-13 DIAGNOSIS — D509 Iron deficiency anemia, unspecified: Secondary | ICD-10-CM | POA: Diagnosis not present

## 2019-07-13 LAB — PROTIME-INR: INR: 3.7 — AB (ref <=1.1)

## 2019-07-14 ENCOUNTER — Other Ambulatory Visit: Payer: Self-pay

## 2019-07-14 ENCOUNTER — Other Ambulatory Visit: Payer: Self-pay | Admitting: Endocrinology

## 2019-07-14 MED ORDER — HUMULIN R U-500 KWIKPEN 500 UNIT/ML ~~LOC~~ SOPN: PEN_INJECTOR | SUBCUTANEOUS | 3 refills | Status: DC

## 2019-07-15 DIAGNOSIS — Z992 Dependence on renal dialysis: Secondary | ICD-10-CM | POA: Diagnosis not present

## 2019-07-15 DIAGNOSIS — N2581 Secondary hyperparathyroidism of renal origin: Secondary | ICD-10-CM | POA: Diagnosis not present

## 2019-07-15 DIAGNOSIS — N186 End stage renal disease: Secondary | ICD-10-CM | POA: Diagnosis not present

## 2019-07-15 DIAGNOSIS — E875 Hyperkalemia: Secondary | ICD-10-CM | POA: Diagnosis not present

## 2019-07-15 DIAGNOSIS — T782XXS Anaphylactic shock, unspecified, sequela: Secondary | ICD-10-CM | POA: Diagnosis not present

## 2019-07-15 DIAGNOSIS — D509 Iron deficiency anemia, unspecified: Secondary | ICD-10-CM | POA: Diagnosis not present

## 2019-07-17 ENCOUNTER — Ambulatory Visit: Payer: Medicare Other | Admitting: Podiatry

## 2019-07-17 DIAGNOSIS — Z452 Encounter for adjustment and management of vascular access device: Secondary | ICD-10-CM | POA: Diagnosis not present

## 2019-07-17 DIAGNOSIS — T829XXA Unspecified complication of cardiac and vascular prosthetic device, implant and graft, initial encounter: Secondary | ICD-10-CM | POA: Insufficient documentation

## 2019-07-18 ENCOUNTER — Telehealth: Payer: Self-pay | Admitting: *Deleted

## 2019-07-18 ENCOUNTER — Ambulatory Visit (INDEPENDENT_AMBULATORY_CARE_PROVIDER_SITE_OTHER): Payer: Medicare Other | Admitting: General Practice

## 2019-07-18 DIAGNOSIS — T782XXS Anaphylactic shock, unspecified, sequela: Secondary | ICD-10-CM | POA: Diagnosis not present

## 2019-07-18 DIAGNOSIS — N2581 Secondary hyperparathyroidism of renal origin: Secondary | ICD-10-CM | POA: Diagnosis not present

## 2019-07-18 DIAGNOSIS — D509 Iron deficiency anemia, unspecified: Secondary | ICD-10-CM | POA: Diagnosis not present

## 2019-07-18 DIAGNOSIS — Z992 Dependence on renal dialysis: Secondary | ICD-10-CM | POA: Diagnosis not present

## 2019-07-18 DIAGNOSIS — Z7901 Long term (current) use of anticoagulants: Secondary | ICD-10-CM | POA: Diagnosis not present

## 2019-07-18 DIAGNOSIS — N186 End stage renal disease: Secondary | ICD-10-CM | POA: Diagnosis not present

## 2019-07-18 DIAGNOSIS — E875 Hyperkalemia: Secondary | ICD-10-CM | POA: Diagnosis not present

## 2019-07-18 NOTE — Patient Instructions (Signed)
Pre visit review using our clinic review tool, if applicable. No additional management support is needed unless otherwise documented below in the visit note.  Received fax on 10/6.  Hold dosage today (10/6) and then change dosage and take 1 tablet daily except 1/2 tablet on Wednesdays.   Dosing instructions left on VM.   Re-check in 1 to 2 weeks at dialysis

## 2019-07-18 NOTE — Telephone Encounter (Signed)
He is using mupirocin ointment and a dry dressing.

## 2019-07-18 NOTE — Progress Notes (Signed)
Medical treatment/procedure(s) were performed by non-physician practitioner and as supervising physician I was immediately available for consultation/collaboration. I agree with above. Hoyt Koch, MD  Patient has not seen PCP in more than 1 year and needs visit with PCP for ongoing coumadin monitoring. Please contact patient to schedule.

## 2019-07-18 NOTE — Telephone Encounter (Signed)
[facility] - request clinicals concerning wound care dressings.

## 2019-07-18 NOTE — Telephone Encounter (Signed)
Faxed 07/03/2019 clinicals to [facility].

## 2019-07-19 ENCOUNTER — Ambulatory Visit: Payer: Medicare Other | Admitting: Podiatry

## 2019-07-20 DIAGNOSIS — T782XXS Anaphylactic shock, unspecified, sequela: Secondary | ICD-10-CM | POA: Diagnosis not present

## 2019-07-20 DIAGNOSIS — E1129 Type 2 diabetes mellitus with other diabetic kidney complication: Secondary | ICD-10-CM | POA: Diagnosis not present

## 2019-07-20 DIAGNOSIS — E875 Hyperkalemia: Secondary | ICD-10-CM | POA: Diagnosis not present

## 2019-07-20 DIAGNOSIS — Z992 Dependence on renal dialysis: Secondary | ICD-10-CM | POA: Diagnosis not present

## 2019-07-20 DIAGNOSIS — I82492 Acute embolism and thrombosis of other specified deep vein of left lower extremity: Secondary | ICD-10-CM | POA: Diagnosis not present

## 2019-07-20 DIAGNOSIS — N186 End stage renal disease: Secondary | ICD-10-CM | POA: Diagnosis not present

## 2019-07-20 DIAGNOSIS — M109 Gout, unspecified: Secondary | ICD-10-CM | POA: Diagnosis not present

## 2019-07-20 DIAGNOSIS — N2581 Secondary hyperparathyroidism of renal origin: Secondary | ICD-10-CM | POA: Diagnosis not present

## 2019-07-20 DIAGNOSIS — D509 Iron deficiency anemia, unspecified: Secondary | ICD-10-CM | POA: Diagnosis not present

## 2019-07-21 ENCOUNTER — Encounter: Payer: Self-pay | Admitting: Internal Medicine

## 2019-07-21 ENCOUNTER — Ambulatory Visit (INDEPENDENT_AMBULATORY_CARE_PROVIDER_SITE_OTHER): Payer: Medicare Other | Admitting: Internal Medicine

## 2019-07-21 ENCOUNTER — Other Ambulatory Visit: Payer: Self-pay

## 2019-07-21 ENCOUNTER — Other Ambulatory Visit (INDEPENDENT_AMBULATORY_CARE_PROVIDER_SITE_OTHER): Payer: Medicare Other

## 2019-07-21 ENCOUNTER — Telehealth: Payer: Self-pay | Admitting: Internal Medicine

## 2019-07-21 ENCOUNTER — Telehealth: Payer: Self-pay | Admitting: *Deleted

## 2019-07-21 VITALS — BP 100/60 | HR 57 | Temp 98.0°F | Ht 70.0 in | Wt 291.0 lb

## 2019-07-21 DIAGNOSIS — E782 Mixed hyperlipidemia: Secondary | ICD-10-CM

## 2019-07-21 DIAGNOSIS — E1121 Type 2 diabetes mellitus with diabetic nephropathy: Secondary | ICD-10-CM

## 2019-07-21 DIAGNOSIS — T782XXS Anaphylactic shock, unspecified, sequela: Secondary | ICD-10-CM | POA: Insufficient documentation

## 2019-07-21 DIAGNOSIS — Z794 Long term (current) use of insulin: Secondary | ICD-10-CM

## 2019-07-21 DIAGNOSIS — Z0000 Encounter for general adult medical examination without abnormal findings: Secondary | ICD-10-CM

## 2019-07-21 LAB — CBC
HCT: 42.2 % (ref 39.0–52.0)
Hemoglobin: 13.2 g/dL (ref 13.0–17.0)
MCHC: 31.3 g/dL (ref 30.0–36.0)
MCV: 90.4 fl (ref 78.0–100.0)
Platelets: 241 10*3/uL (ref 150.0–400.0)
RBC: 4.67 Mil/uL (ref 4.22–5.81)
RDW: 18.8 % — ABNORMAL HIGH (ref 11.5–15.5)
WBC: 7.8 10*3/uL (ref 4.0–10.5)

## 2019-07-21 LAB — COMPREHENSIVE METABOLIC PANEL
ALT: 6 U/L (ref 0–53)
AST: 10 U/L (ref 0–37)
Albumin: 3.9 g/dL (ref 3.5–5.2)
Alkaline Phosphatase: 56 U/L (ref 39–117)
BUN: 36 mg/dL — ABNORMAL HIGH (ref 6–23)
CO2: 29 mEq/L (ref 19–32)
Calcium: 9.4 mg/dL (ref 8.4–10.5)
Chloride: 94 mEq/L — ABNORMAL LOW (ref 96–112)
Creatinine, Ser: 7.62 mg/dL (ref 0.40–1.50)
GFR: 7.43 mL/min — CL (ref >60.00)
Glucose, Bld: 335 mg/dL — ABNORMAL HIGH (ref 70–99)
Potassium: 4 mEq/L (ref 3.5–5.1)
Sodium: 136 mEq/L (ref 135–145)
Total Bilirubin: 1.1 mg/dL (ref 0.2–1.2)
Total Protein: 7.5 g/dL (ref 6.0–8.3)

## 2019-07-21 LAB — LIPID PANEL
Cholesterol: 93 mg/dL (ref 0–200)
HDL: 28 mg/dL — ABNORMAL LOW (ref >39.00)
LDL Cholesterol: 36 mg/dL (ref 0–99)
NonHDL: 64.86
Total CHOL/HDL Ratio: 3
Triglycerides: 146 mg/dL (ref 0.0–149.0)
VLDL: 29.2 mg/dL (ref 0.0–40.0)

## 2019-07-21 LAB — PROTIME-INR: INR: 3 — AB (ref <=1.1)

## 2019-07-21 LAB — HEMOGLOBIN A1C: Hgb A1c MFr Bld: 7.2 % — ABNORMAL HIGH (ref 4.6–6.5)

## 2019-07-21 NOTE — Assessment & Plan Note (Signed)
Checking lipid panel and adjust crestor as needed.

## 2019-07-21 NOTE — Assessment & Plan Note (Signed)
Flu shot up to date. Pneumonia up to date. Shingrix declines. Tetanus up to date. Colonoscopy up to date. Counseled about sun safety and mole surveillance. Counseled about the dangers of distracted driving. Given 10 year screening recommendations.

## 2019-07-21 NOTE — Telephone Encounter (Signed)
Lab called to report critical levels:  Creatine - 7.62 GFR - 7.43

## 2019-07-21 NOTE — Telephone Encounter (Signed)
noted 

## 2019-07-21 NOTE — Assessment & Plan Note (Signed)
With current foot wound, still seeing endo. Foot exam done. Checking HgA1c. No microalbumin given dialysis.

## 2019-07-21 NOTE — Assessment & Plan Note (Signed)
Weight is still up and activity is poor, he does not want to try to increase exercise at this time.

## 2019-07-21 NOTE — Patient Instructions (Addendum)
For sleep try melatonin over the counter to help.   Health Maintenance, Male Adopting a healthy lifestyle and getting preventive care are important in promoting health and wellness. Ask your health care provider about:  The right schedule for you to have regular tests and exams.  Things you can do on your own to prevent diseases and keep yourself healthy. What should I know about diet, weight, and exercise? Eat a healthy diet   Eat a diet that includes plenty of vegetables, fruits, low-fat dairy products, and lean protein.  Do not eat a lot of foods that are high in solid fats, added sugars, or sodium. Maintain a healthy weight Body mass index (BMI) is a measurement that can be used to identify possible weight problems. It estimates body fat based on height and weight. Your health care provider can help determine your BMI and help you achieve or maintain a healthy weight. Get regular exercise Get regular exercise. This is one of the most important things you can do for your health. Most adults should:  Exercise for at least 150 minutes each week. The exercise should increase your heart rate and make you sweat (moderate-intensity exercise).  Do strengthening exercises at least twice a week. This is in addition to the moderate-intensity exercise.  Spend less time sitting. Even light physical activity can be beneficial. Watch cholesterol and blood lipids Have your blood tested for lipids and cholesterol at 55 years of age, then have this test every 5 years. You may need to have your cholesterol levels checked more often if:  Your lipid or cholesterol levels are high.  You are older than 55 years of age.  You are at high risk for heart disease. What should I know about cancer screening? Many types of cancers can be detected early and may often be prevented. Depending on your health history and family history, you may need to have cancer screening at various ages. This may include  screening for:  Colorectal cancer.  Prostate cancer.  Skin cancer.  Lung cancer. What should I know about heart disease, diabetes, and high blood pressure? Blood pressure and heart disease  High blood pressure causes heart disease and increases the risk of stroke. This is more likely to develop in people who have high blood pressure readings, are of African descent, or are overweight.  Talk with your health care provider about your target blood pressure readings.  Have your blood pressure checked: ? Every 3-5 years if you are 110-56 years of age. ? Every year if you are 59 years old or older.  If you are between the ages of 62 and 8 and are a current or former smoker, ask your health care provider if you should have a one-time screening for abdominal aortic aneurysm (AAA). Diabetes Have regular diabetes screenings. This checks your fasting blood sugar level. Have the screening done:  Once every three years after age 8 if you are at a normal weight and have a low risk for diabetes.  More often and at a younger age if you are overweight or have a high risk for diabetes. What should I know about preventing infection? Hepatitis B If you have a higher risk for hepatitis B, you should be screened for this virus. Talk with your health care provider to find out if you are at risk for hepatitis B infection. Hepatitis C Blood testing is recommended for:  Everyone born from [birth year] through July 24, 1966.  Anyone with known risk factors for hepatitis C.  Sexually transmitted infections (STIs)  You should be screened each year for STIs, including gonorrhea and chlamydia, if: ? You are sexually active and are younger than 55 years of age. ? You are older than 55 years of age and your health care provider tells you that you are at risk for this type of infection. ? Your sexual activity has changed since you were last screened, and you are at increased risk for chlamydia or gonorrhea. Ask your health  care provider if you are at risk.  Ask your health care provider about whether you are at high risk for HIV. Your health care provider may recommend a prescription medicine to help prevent HIV infection. If you choose to take medicine to prevent HIV, you should first get tested for HIV. You should then be tested every 3 months for as long as you are taking the medicine. Follow these instructions at home: Lifestyle  Do not use any products that contain nicotine or tobacco, such as cigarettes, e-cigarettes, and chewing tobacco. If you need help quitting, ask your health care provider.  Do not use street drugs.  Do not share needles.  Ask your health care provider for help if you need support or information about quitting drugs. Alcohol use  Do not drink alcohol if your health care provider tells you not to drink.  If you drink alcohol: ? Limit how much you have to 0-2 drinks a day. ? Be aware of how much alcohol is in your drink. In the [place] one drink equals one 12 oz bottle of beer (355 mL), one 5 oz glass of wine (148 mL), or one 1 oz glass of hard liquor (44 mL). General instructions  Schedule regular health, dental, and eye exams.  Stay current with your vaccines.  Tell your health care provider if: ? You often feel depressed. ? You have ever been abused or do not feel safe at home. Summary  Adopting a healthy lifestyle and getting preventive care are important in promoting health and wellness.  Follow your health care provider's instructions about healthy diet, exercising, and getting tested or screened for diseases.  Follow your health care provider's instructions on monitoring your cholesterol and blood pressure. This information is not intended to replace advice given to you by your health care provider. Make sure you discuss any questions you have with your health care provider. Document Released: 03/26/2008 Document Revised: 09/21/2018 Document Reviewed: 09/21/2018  Elsevier Patient Education  2020 [employer].

## 2019-07-21 NOTE — Telephone Encounter (Signed)
Please advise per Dr. Crawford's absence. Thank you  

## 2019-07-21 NOTE — Progress Notes (Signed)
Subjective:   Patient ID: [name] [name], male    DOB: 1965-11-04, 53 y.o.   MRN: [id number]  HPI Here for medicare wellness, no new complaints. Please see A/P for status and treatment of chronic medical problems.   HPI #2: Here for follow up cholesterol (taking cresor, denies chest pains or stroke symptoms, denies side effects) and diabetes (wound on foot seeing podiatry and they are treated, vascular disease severe which limits his healing, denies low sugars, seeing endo for insulin management), and blood pressure (doing well with dialysis still, denies headache or chest pains, taking no meds right now).   Diet: DM since diabetic Physical activity: sedentary Depression/mood screen: negative Hearing: intact to whispered voice Visual acuity: grossly normal with lens, performs annual eye exam  ADLs: capable Fall risk: low Home safety: good Cognitive evaluation: intact to orientation, naming, recall and repetition EOL planning: adv directives discussed    Patient Outreach Telephone from 01/30/2019 in [facility]  PHQ-2 Total Score  1      I have personally reviewed and have noted 1. The patient's medical and social history - reviewed today no changes 2. Their use of alcohol, tobacco or illicit drugs 3. Their current medications and supplements 4. The patient's functional ability including ADL's, fall risks, home safety risks and hearing or visual impairment. 5. Diet and physical activities 6. Evidence for depression or mood disorders 7. Care team reviewed and updated  Patient Care Team: Hoyt Koch, MD as PCP - General (Internal Medicine) Burnell Blanks, MD as PCP - Cardiology (Cardiology) Jamal Maes, MD as Consulting Physician (Nephrology) Burnell Blanks, MD as Consulting Physician (Cardiology) Kathrynn Ducking, MD as Consulting Physician (Neurology) Past Medical History:  Diagnosis Date  . Allergic rhinitis   . Anemia   .  Aortic insufficiency    a. Echo 7/16:  Mod LVH, EF 50-55%, mild to mod AI, severe LAE, PASP 57 mmHg (LV ID end diastolic 56.3 mm);  b. Echo 2/17:  Mod LVH, EF 50-55%, mod AI, MAC, mild MR, mod LAE, mild to mod TR, PASP 63 mmHg  . Bilateral carpal tunnel syndrome   . CHF (congestive heart failure) ([place])   . Claustrophobia   . COPD (chronic obstructive pulmonary disease) ([place])   . Diabetes mellitus    Type II  . Diabetic peripheral neuropathy ([place])   . Diabetic retinopathy ([place])   . Discitis of lumbar region    resolved  . DVT (deep venous thrombosis) ([place]) 10/07/2015   LLE  . Endocarditis 11/28/14  . ESRD (end stage renal disease) on dialysis [facility])    s/p renal transplant in 2009, failed in 2016  . Gait disorder   . Gastroparesis   . GERD (gastroesophageal reflux disease)   . Gout   . Hearing difficulty   . Hyperlipidemia   . Hypertension   . Incomplete bladder emptying   . Leg pain 02/05/11   with walking  . Morbid obesity ([place])   . Obstructive sleep apnea    not using CPAP- lost 100lbs  . Osteomyelitis ([place])   . Posttraumatic stress disorder   . Rotator cuff disorder   . Secondary hyperparathyroidism ([place])   . Sepsis ([place])    Postive blood cultures -   . Urethral stricture   . Vitamin D deficiency    Past Surgical History:  Procedure Laterality Date  . ARTERIOVENOUS GRAFT PLACEMENT Bilateral "several"  . AV FISTULA PLACEMENT Bilateral 2015  . AV FISTULA PLACEMENT Right 06/13/2019  Procedure: INSERTION OF ARTERIOVENOUS (AV) GORE-TEX GRAFT RIGHT  ARM;  Surgeon: Elam Dutch, MD;  Location: [place];  Service: Vascular;  Laterality: Right;  . BASCILIC VEIN TRANSPOSITION Right 09/27/2015  . [place] Right 09/27/2015   Procedure: BASILIC VEIN TRANSPOSITION right;  Surgeon: Mal Misty, MD;  Location: [place];  Service: Vascular;  Laterality: Right;  . CATARACT EXTRACTION W/ INTRAOCULAR LENS  IMPLANT, BILATERAL Bilateral   . CYSTOSCOPY W/ INTERNAL  URETHROTOMY  09/2014  . KIDNEY TRANSPLANT  2009  . UPPER EXTREMITY VENOGRAPHY Bilateral 06/02/2019   Procedure: UPPER EXTREMITY VENOGRAPHY;  Surgeon: Elam Dutch, MD;  Location: [place] [facility];  Service: Cardiovascular;  Laterality: Bilateral;   Family History  Problem Relation Age of Onset  . Hypertension Mother   . Diabetes Father    Review of Systems  Constitutional: Negative.   HENT: Negative.   Eyes: Negative.   Respiratory: Negative for cough, chest tightness and shortness of breath.   Cardiovascular: Negative for chest pain, palpitations and leg swelling.  Gastrointestinal: Negative for abdominal distention, abdominal pain, constipation, diarrhea, nausea and vomiting.  Musculoskeletal: Negative.   Skin: Negative.   Neurological: Negative.   Psychiatric/Behavioral: Positive for sleep disturbance.    Objective:  Physical Exam Constitutional:      Appearance: He is well-developed. He is obese.  HENT:     Head: Normocephalic and atraumatic.  Neck:     Musculoskeletal: Normal range of motion.  Cardiovascular:     Rate and Rhythm: Normal rate and regular rhythm.  Pulmonary:     Effort: Pulmonary effort is normal. No respiratory distress.     Breath sounds: Normal breath sounds. No wheezing or rales.  Abdominal:     General: Bowel sounds are normal. There is no distension.     Palpations: Abdomen is soft.     Tenderness: There is no abdominal tenderness. There is no rebound.  Skin:    General: Skin is warm and dry.  Neurological:     Mental Status: He is alert and oriented to person, place, and time.     Coordination: Coordination normal.     Comments: Scooter for long distances     Vitals:   07/21/19 1043  BP: 100/60  Pulse: (!) 57  Temp: 98 F (36.7 C)  TempSrc: Oral  SpO2: 95%  Weight: 291 lb (132 kg)  Height: _0  (1.778 m)    Assessment & Plan:

## 2019-07-21 NOTE — Telephone Encounter (Signed)
Faxed [employer] PA for Santyl completed by Dr. Jacqualyn Posey, with 07/03/2019 clinicals, and demographics to [facility].

## 2019-07-21 NOTE — Telephone Encounter (Signed)
On dialysis - consistent with prior.    No action needed

## 2019-07-22 DIAGNOSIS — N2581 Secondary hyperparathyroidism of renal origin: Secondary | ICD-10-CM | POA: Diagnosis not present

## 2019-07-22 DIAGNOSIS — T782XXS Anaphylactic shock, unspecified, sequela: Secondary | ICD-10-CM | POA: Diagnosis not present

## 2019-07-22 DIAGNOSIS — N186 End stage renal disease: Secondary | ICD-10-CM | POA: Diagnosis not present

## 2019-07-22 DIAGNOSIS — E875 Hyperkalemia: Secondary | ICD-10-CM | POA: Diagnosis not present

## 2019-07-22 DIAGNOSIS — D509 Iron deficiency anemia, unspecified: Secondary | ICD-10-CM | POA: Diagnosis not present

## 2019-07-22 DIAGNOSIS — Z992 Dependence on renal dialysis: Secondary | ICD-10-CM | POA: Diagnosis not present

## 2019-07-25 ENCOUNTER — Ambulatory Visit (INDEPENDENT_AMBULATORY_CARE_PROVIDER_SITE_OTHER): Payer: Medicare Other | Admitting: General Practice

## 2019-07-25 DIAGNOSIS — E875 Hyperkalemia: Secondary | ICD-10-CM | POA: Diagnosis not present

## 2019-07-25 DIAGNOSIS — D509 Iron deficiency anemia, unspecified: Secondary | ICD-10-CM | POA: Diagnosis not present

## 2019-07-25 DIAGNOSIS — Z7901 Long term (current) use of anticoagulants: Secondary | ICD-10-CM

## 2019-07-25 DIAGNOSIS — N186 End stage renal disease: Secondary | ICD-10-CM | POA: Diagnosis not present

## 2019-07-25 DIAGNOSIS — Z992 Dependence on renal dialysis: Secondary | ICD-10-CM | POA: Diagnosis not present

## 2019-07-25 DIAGNOSIS — N2581 Secondary hyperparathyroidism of renal origin: Secondary | ICD-10-CM | POA: Diagnosis not present

## 2019-07-25 DIAGNOSIS — T782XXS Anaphylactic shock, unspecified, sequela: Secondary | ICD-10-CM | POA: Diagnosis not present

## 2019-07-25 NOTE — Patient Instructions (Signed)
Pre visit review using our clinic review tool, if applicable. No additional management support is needed unless otherwise documented below in the visit note.  Received fax on 10/13. Continue to take 1 tablet daily except 1/2 tablet on Wednesdays.   Dosing instructions left on VM.   Re-check in 1 to 2 weeks at dialysis

## 2019-07-25 NOTE — Progress Notes (Signed)
Medical treatment/procedure(s) were performed by non-physician practitioner and as supervising physician I was immediately available for consultation/collaboration. I agree with above. Elizabeth A Crawford, MD  

## 2019-07-27 DIAGNOSIS — D509 Iron deficiency anemia, unspecified: Secondary | ICD-10-CM | POA: Diagnosis not present

## 2019-07-27 DIAGNOSIS — Z992 Dependence on renal dialysis: Secondary | ICD-10-CM | POA: Diagnosis not present

## 2019-07-27 DIAGNOSIS — I82492 Acute embolism and thrombosis of other specified deep vein of left lower extremity: Secondary | ICD-10-CM | POA: Diagnosis not present

## 2019-07-27 DIAGNOSIS — T782XXS Anaphylactic shock, unspecified, sequela: Secondary | ICD-10-CM | POA: Diagnosis not present

## 2019-07-27 DIAGNOSIS — N186 End stage renal disease: Secondary | ICD-10-CM | POA: Diagnosis not present

## 2019-07-27 DIAGNOSIS — E875 Hyperkalemia: Secondary | ICD-10-CM | POA: Diagnosis not present

## 2019-07-27 DIAGNOSIS — N2581 Secondary hyperparathyroidism of renal origin: Secondary | ICD-10-CM | POA: Diagnosis not present

## 2019-07-29 DIAGNOSIS — E875 Hyperkalemia: Secondary | ICD-10-CM | POA: Diagnosis not present

## 2019-07-29 DIAGNOSIS — N2581 Secondary hyperparathyroidism of renal origin: Secondary | ICD-10-CM | POA: Diagnosis not present

## 2019-07-29 DIAGNOSIS — Z992 Dependence on renal dialysis: Secondary | ICD-10-CM | POA: Diagnosis not present

## 2019-07-29 DIAGNOSIS — D509 Iron deficiency anemia, unspecified: Secondary | ICD-10-CM | POA: Diagnosis not present

## 2019-07-29 DIAGNOSIS — T782XXS Anaphylactic shock, unspecified, sequela: Secondary | ICD-10-CM | POA: Diagnosis not present

## 2019-07-29 DIAGNOSIS — N186 End stage renal disease: Secondary | ICD-10-CM | POA: Diagnosis not present

## 2019-07-31 ENCOUNTER — Other Ambulatory Visit: Payer: Self-pay | Admitting: Radiology

## 2019-07-31 ENCOUNTER — Other Ambulatory Visit (HOSPITAL_COMMUNITY): Payer: Self-pay | Admitting: Nephrology

## 2019-07-31 DIAGNOSIS — N186 End stage renal disease: Secondary | ICD-10-CM

## 2019-07-31 DIAGNOSIS — Z992 Dependence on renal dialysis: Secondary | ICD-10-CM

## 2019-08-01 ENCOUNTER — Encounter (HOSPITAL_COMMUNITY): Payer: Self-pay | Admitting: Interventional Radiology

## 2019-08-01 ENCOUNTER — Other Ambulatory Visit: Payer: Self-pay | Admitting: Endocrinology

## 2019-08-01 ENCOUNTER — Ambulatory Visit (INDEPENDENT_AMBULATORY_CARE_PROVIDER_SITE_OTHER): Payer: Medicare Other | Admitting: General Practice

## 2019-08-01 ENCOUNTER — Other Ambulatory Visit: Payer: Self-pay | Admitting: Internal Medicine

## 2019-08-01 ENCOUNTER — Other Ambulatory Visit: Payer: Self-pay

## 2019-08-01 ENCOUNTER — Ambulatory Visit (HOSPITAL_COMMUNITY)
Admission: RE | Admit: 2019-08-01 | Discharge: 2019-08-01 | Disposition: A | Discharge status: Home / Self Care | Payer: Medicare Other | Source: Ambulatory Visit | Attending: Nephrology | Admitting: Nephrology

## 2019-08-01 ENCOUNTER — Other Ambulatory Visit (HOSPITAL_COMMUNITY): Payer: Self-pay | Admitting: Nephrology

## 2019-08-01 DIAGNOSIS — G5603 Carpal tunnel syndrome, bilateral upper limbs: Secondary | ICD-10-CM | POA: Diagnosis not present

## 2019-08-01 DIAGNOSIS — I132 Hypertensive heart and chronic kidney disease with heart failure and with stage 5 chronic kidney disease, or end stage renal disease: Secondary | ICD-10-CM | POA: Diagnosis not present

## 2019-08-01 DIAGNOSIS — E1142 Type 2 diabetes mellitus with diabetic polyneuropathy: Secondary | ICD-10-CM | POA: Diagnosis not present

## 2019-08-01 DIAGNOSIS — Z888 Allergy status to other drugs, medicaments and biological substances status: Secondary | ICD-10-CM | POA: Diagnosis not present

## 2019-08-01 DIAGNOSIS — Z8249 Family history of ischemic heart disease and other diseases of the circulatory system: Secondary | ICD-10-CM | POA: Insufficient documentation

## 2019-08-01 DIAGNOSIS — E11319 Type 2 diabetes mellitus with unspecified diabetic retinopathy without macular edema: Secondary | ICD-10-CM | POA: Insufficient documentation

## 2019-08-01 DIAGNOSIS — G4733 Obstructive sleep apnea (adult) (pediatric): Secondary | ICD-10-CM | POA: Insufficient documentation

## 2019-08-01 DIAGNOSIS — Z6841 Body Mass Index (BMI) 40.0 and over, adult: Secondary | ICD-10-CM | POA: Diagnosis not present

## 2019-08-01 DIAGNOSIS — E785 Hyperlipidemia, unspecified: Secondary | ICD-10-CM | POA: Diagnosis not present

## 2019-08-01 DIAGNOSIS — Z7901 Long term (current) use of anticoagulants: Secondary | ICD-10-CM | POA: Diagnosis not present

## 2019-08-01 DIAGNOSIS — K219 Gastro-esophageal reflux disease without esophagitis: Secondary | ICD-10-CM | POA: Diagnosis not present

## 2019-08-01 DIAGNOSIS — N186 End stage renal disease: Secondary | ICD-10-CM

## 2019-08-01 DIAGNOSIS — Z794 Long term (current) use of insulin: Secondary | ICD-10-CM | POA: Diagnosis not present

## 2019-08-01 DIAGNOSIS — Z992 Dependence on renal dialysis: Secondary | ICD-10-CM | POA: Insufficient documentation

## 2019-08-01 DIAGNOSIS — Z79899 Other long term (current) drug therapy: Secondary | ICD-10-CM | POA: Diagnosis not present

## 2019-08-01 DIAGNOSIS — E1122 Type 2 diabetes mellitus with diabetic chronic kidney disease: Secondary | ICD-10-CM | POA: Insufficient documentation

## 2019-08-01 DIAGNOSIS — Y832 Surgical operation with anastomosis, bypass or graft as the cause of abnormal reaction of the patient, or of later complication, without mention of misadventure at the time of the procedure: Secondary | ICD-10-CM | POA: Diagnosis not present

## 2019-08-01 DIAGNOSIS — Z940 Kidney transplant status: Secondary | ICD-10-CM | POA: Insufficient documentation

## 2019-08-01 DIAGNOSIS — Z86718 Personal history of other venous thrombosis and embolism: Secondary | ICD-10-CM | POA: Insufficient documentation

## 2019-08-01 DIAGNOSIS — T82868A Thrombosis of vascular prosthetic devices, implants and grafts, initial encounter: Secondary | ICD-10-CM | POA: Insufficient documentation

## 2019-08-01 DIAGNOSIS — Z833 Family history of diabetes mellitus: Secondary | ICD-10-CM | POA: Insufficient documentation

## 2019-08-01 DIAGNOSIS — T8611 Kidney transplant rejection: Secondary | ICD-10-CM | POA: Insufficient documentation

## 2019-08-01 DIAGNOSIS — Z95828 Presence of other vascular implants and grafts: Secondary | ICD-10-CM | POA: Insufficient documentation

## 2019-08-01 DIAGNOSIS — N2581 Secondary hyperparathyroidism of renal origin: Secondary | ICD-10-CM | POA: Diagnosis not present

## 2019-08-01 DIAGNOSIS — J449 Chronic obstructive pulmonary disease, unspecified: Secondary | ICD-10-CM | POA: Diagnosis not present

## 2019-08-01 DIAGNOSIS — Z885 Allergy status to narcotic agent status: Secondary | ICD-10-CM | POA: Insufficient documentation

## 2019-08-01 DIAGNOSIS — I509 Heart failure, unspecified: Secondary | ICD-10-CM | POA: Diagnosis not present

## 2019-08-01 DIAGNOSIS — M109 Gout, unspecified: Secondary | ICD-10-CM | POA: Insufficient documentation

## 2019-08-01 HISTORY — PX: IR THROMBECTOMY AV FISTULA W/THROMBOLYSIS/PTA INC/SHUNT/IMG RIGHT: IMG6119

## 2019-08-01 HISTORY — PX: IR US GUIDE VASC ACCESS RIGHT: IMG2390

## 2019-08-01 LAB — BASIC METABOLIC PANEL
Anion gap: 18 — ABNORMAL HIGH (ref 5–15)
BUN: 58 mg/dL — ABNORMAL HIGH (ref 6–20)
CO2: 23 mmol/L (ref 22–32)
Calcium: 9.3 mg/dL (ref 8.9–10.3)
Chloride: 97 mmol/L — ABNORMAL LOW (ref 98–111)
Creatinine, Ser: 12.86 mg/dL — ABNORMAL HIGH (ref 0.61–1.24)
GFR calc Af Amer: 4 mL/min — ABNORMAL LOW (ref >60)
GFR calc non Af Amer: 4 mL/min — ABNORMAL LOW (ref >60)
Glucose, Bld: 132 mg/dL — ABNORMAL HIGH (ref 70–99)
Potassium: 5.4 mmol/L — ABNORMAL HIGH (ref 3.5–5.1)
Sodium: 138 mmol/L (ref 135–145)

## 2019-08-01 LAB — CBC
HCT: 42.8 % (ref 39.0–52.0)
Hemoglobin: 12.8 g/dL — ABNORMAL LOW (ref 13.0–17.0)
MCH: 28.3 pg (ref 26.0–34.0)
MCHC: 29.9 g/dL — ABNORMAL LOW (ref 30.0–36.0)
MCV: 94.7 fL (ref 80.0–100.0)
Platelets: 266 10*3/uL (ref 150–400)
RBC: 4.52 MIL/uL (ref 4.22–5.81)
RDW: 16.6 % — ABNORMAL HIGH (ref 11.5–15.5)
WBC: 8.3 10*3/uL (ref 4.0–10.5)
nRBC: 0 % (ref 0.0–0.2)

## 2019-08-01 LAB — GLUCOSE, CAPILLARY: Glucose-Capillary: 120 mg/dL — ABNORMAL HIGH (ref 70–99)

## 2019-08-01 LAB — POCT INR: INR: 2.1 (ref 2.0–3.0)

## 2019-08-01 LAB — PROTIME-INR
INR: 2.4 — ABNORMAL HIGH (ref 0.8–1.2)
Prothrombin Time: 25.4 seconds — ABNORMAL HIGH (ref 11.4–15.2)

## 2019-08-01 MED ORDER — ALTEPLASE 2 MG IJ SOLR
INTRAMUSCULAR | Status: AC
  Filled 2019-08-01: qty 2

## 2019-08-01 MED ORDER — FENTANYL CITRATE (PF) 100 MCG/2ML IJ SOLN
INTRAMUSCULAR | Status: AC
  Filled 2019-08-01: qty 2

## 2019-08-01 MED ORDER — FENTANYL CITRATE (PF) 100 MCG/2ML IJ SOLN
INTRAMUSCULAR | Status: AC | PRN
  Administered 2019-08-01: 50 ug via INTRAVENOUS

## 2019-08-01 MED ORDER — MIDAZOLAM HCL 2 MG/2ML IJ SOLN
INTRAMUSCULAR | Status: AC | PRN
  Administered 2019-08-01: 1 mg via INTRAVENOUS

## 2019-08-01 MED ORDER — LIDOCAINE HCL (PF) 1 % IJ SOLN
INTRAMUSCULAR | Status: AC | PRN
  Administered 2019-08-01: 10 mL

## 2019-08-01 MED ORDER — MIDAZOLAM HCL 2 MG/2ML IJ SOLN
INTRAMUSCULAR | Status: AC
  Filled 2019-08-01: qty 2

## 2019-08-01 MED ORDER — LIDOCAINE HCL 1 % IJ SOLN
INTRAMUSCULAR | Status: AC
  Filled 2019-08-01: qty 20

## 2019-08-01 MED ORDER — ALTEPLASE 2 MG IJ SOLR
INTRAMUSCULAR | Status: AC | PRN
  Administered 2019-08-01 (×2): 1 mg

## 2019-08-01 MED ORDER — IOHEXOL 300 MG/ML  SOLN
100.0000 mL | Freq: Once | INTRAMUSCULAR | Status: AC | PRN
  Administered 2019-08-01: 50 mL via INTRA_ARTERIAL

## 2019-08-01 NOTE — Progress Notes (Signed)
Medical treatment/procedure(s) were performed by non-physician practitioner and as supervising physician I was immediately available for consultation/collaboration. I agree with above. Elizabeth A Crawford, MD  

## 2019-08-01 NOTE — Patient Instructions (Signed)
Pre visit review using our clinic review tool, if applicable. No additional management support is needed unless otherwise documented below in the visit note.  Continue to take 1 tablet daily except 1/2 tablet on Wednesdays.   Dosing instructions left on VM.   Re-check in 1 to 2 weeks at dialysis

## 2019-08-01 NOTE — H&P (Signed)
Chief Complaint: Patient was seen in consultation today for fistulagram with possible intervention.  Referring Physician(s): Dunham,Cynthia  Supervising Physician: Aletta Edouard  Patient Status: [facility] - Out-pt  History of Present Illness: [name] [name] is a 53 y.o. male with a past medical history significant for anemia, aortic insufficiency, CHF, HTN, HLD, gout, GERD, DVT, COPD, DM and ESRD on HD T/Th/S who presents today due to clotted dialysis access. Per patient he has a right upper extremity AV fistula as well as a right upper extremity graft which is directly below his fistula - he is not sure why the graft was recently placed but thinks it was because there were problems with his fistula. He states he had to have a dialysis catheter in his chest for awhile which was recently removed. He states his graft was working well for about 2-3 weeks and then on Saturday when he presented to dialysis he was told that his graft "stopped working after 2 hours" and that he was told to come here today to get it looked at. He is not sure what happened for it to stop working but states he was told that he would need to start trying to use heparin with dialysis. He states that he previously lived in [place] and when he was given heparin he had profuse vomiting for several days as well as a rash all over his body and swelling of his eyes and lips. He states that he takes Coumadin now because he cannot take heparin. He has never had a fistulagram before that he is aware of.   Past Medical History:  Diagnosis Date  . Allergic rhinitis   . Anemia   . Aortic insufficiency    a. Echo 7/16:  Mod LVH, EF 50-55%, mild to mod AI, severe LAE, PASP 57 mmHg (LV ID end diastolic 00.8 mm);  b. Echo 2/17:  Mod LVH, EF 50-55%, mod AI, MAC, mild MR, mod LAE, mild to mod TR, PASP 63 mmHg  . Bilateral carpal tunnel syndrome   . CHF (congestive heart failure) ([place])   . Claustrophobia   . COPD (chronic obstructive  pulmonary disease) ([place])   . Diabetes mellitus    Type II  . Diabetic peripheral neuropathy ([place])   . Diabetic retinopathy ([place])   . Discitis of lumbar region    resolved  . DVT (deep venous thrombosis) ([place]) 10/07/2015   LLE  . Endocarditis 11/28/14  . ESRD (end stage renal disease) on dialysis [facility])    s/p renal transplant in 2009, failed in 2016  . Gait disorder   . Gastroparesis   . GERD (gastroesophageal reflux disease)   . Gout   . Hearing difficulty   . Hyperlipidemia   . Hypertension   . Incomplete bladder emptying   . Leg pain 02/05/11   with walking  . Morbid obesity ([place])   . Obstructive sleep apnea    not using CPAP- lost 100lbs  . Osteomyelitis ([place])   . Posttraumatic stress disorder   . Rotator cuff disorder   . Secondary hyperparathyroidism ([place])   . Sepsis ([place])    Postive blood cultures -   . Urethral stricture   . Vitamin D deficiency     Past Surgical History:  Procedure Laterality Date  . ARTERIOVENOUS GRAFT PLACEMENT Bilateral "several"  . AV FISTULA PLACEMENT Bilateral 2015  . AV FISTULA PLACEMENT Right 06/13/2019   Procedure: INSERTION OF ARTERIOVENOUS (AV) GORE-TEX GRAFT RIGHT  ARM;  Surgeon: Elam Dutch, MD;  Location: [facility] OR;  Service: Vascular;  Laterality: Right;  . BASCILIC VEIN TRANSPOSITION Right 09/27/2015  . [place] Right 09/27/2015   Procedure: BASILIC VEIN TRANSPOSITION right;  Surgeon: Mal Misty, MD;  Location: [place];  Service: Vascular;  Laterality: Right;  . CATARACT EXTRACTION W/ INTRAOCULAR LENS  IMPLANT, BILATERAL Bilateral   . CYSTOSCOPY W/ INTERNAL URETHROTOMY  09/2014  . KIDNEY TRANSPLANT  2009  . UPPER EXTREMITY VENOGRAPHY Bilateral 06/02/2019   Procedure: UPPER EXTREMITY VENOGRAPHY;  Surgeon: Elam Dutch, MD;  Location: [place] CV LAB;  Service: Cardiovascular;  Laterality: Bilateral;    Allergies: Pork-derived products, Ace inhibitors, Hydrocodone, and Pork allergy  Medications:  Prior to Admission medications   Medication Sig Start Date End Date Taking? Authorizing Provider  calcium acetate (PHOSLO) 667 MG capsule Take 3 capsules (2,001 mg total) by mouth 3 (three) times daily with meals. 10/26/15  Yes Ghimire, Henreitta Leber, MD  clonazePAM Bobbye [place]) 0.5 MG tablet  02/16/19  Yes [provider]  Insulin Glargine (TOUJEO SOLOSTAR) 300 UNIT/ML SOPN Inject 70-80 Units into the skin 2 (two) times daily. Inject 80 units in the morning and inject 70 units in the evening. Patient taking differently: Inject 80 Units into the skin 2 (two) times daily. Inject 80 units under the skin twice daily. 05/18/18  Yes Elayne Snare, MD  insulin regular human CONCENTRATED (HUMULIN R U-500 KWIKPEN) 500 UNIT/ML kwikpen Inject 50 units for breakfast 70 at lunch and 40 at supper-inject 30 minutes before each meal 07/14/19  Yes Elayne Snare, MD  midodrine (PROAMATINE) 10 MG tablet Take 10 mg by mouth as needed (low blood pressure).  01/08/17  Yes [provider]  omeprazole (PRILOSEC) 20 MG capsule Take 1 capsule (20 mg total) by mouth daily. NEED OFFICE VISIT FOR FURTHER REFILLS 07/05/19  Yes Hoyt Koch, MD  RENVELA 800 MG tablet Take 2,400 mg by mouth 3 (three) times daily with meals.  02/02/17  Yes [provider]  rosuvastatin (CRESTOR) 20 MG tablet Take 1 tablet (20 mg total) by mouth at bedtime. NEED OFFICE VISIT FOR FURTHER REFILLS 07/05/19  Yes Hoyt Koch, MD  Suvorexant (BELSOMRA) 10 MG TABS Take 10 mg by mouth at bedtime. 07/15/18  Yes Hoyt Koch, MD  VELTASSA 8.4 g packet Take 8.4 g by mouth daily.  12/30/16  Yes [provider]  allopurinol (ZYLOPRIM) 100 MG tablet Take 1 tablet (100 mg total) by mouth 3 (three) times a week. After dialysis 05/17/19   Hoyt Koch, MD  ammonium lactate (AMLACTIN) 12 % cream Apply topically as needed for dry skin. 05/15/19   Trula Slade, DPM  clindamycin (CLEOCIN) 300 MG capsule Take 1 capsule  (300 mg total) by mouth 3 (three) times daily. 06/20/19   Trula Slade, DPM  glucose blood (ACCU-CHEK GUIDE) test strip 1 each by Other route as needed for other. Use as instructed to check blood sugar 4 times daily. DX:E11.65 05/05/19   Elayne Snare, MD  glucose blood (ACCU-CHEK SMARTVIEW) test strip Use to test 4 times daily DX code E11.29 06/27/18   Elayne Snare, MD  heparin 1000 UNIT/ML injection Heparin Sodium (Porcine) 1,000 Units/mL Catheter Lock Venous 05/18/19 05/16/20  [provider]  hydrocortisone-pramoxine (PROCTOFOAM-HC) rectal foam Place 1 applicator rectally 3 (three) times daily. Prn hemorrhoids 03/16/18   Marrian Salvage, FNP  Insulin Pen Needle 32G X 8 MM MISC Use with insulin x5 times a day 06/06/19   Elayne Snare,  MD  mupirocin ointment (BACTROBAN) 2 % Apply 1 application topically 2 (two) times daily. 06/20/19   Trula Slade, DPM  ONE TOUCH LANCETS MISC Use to test sugar 4 times daily 05/02/18   Elayne Snare, MD  oxyCODONE-acetaminophen (PERCOCET/ROXICET) 5-325 MG tablet Take 1 tablet by mouth every 6 (six) hours as needed. 06/13/19   Ulyses Amor, PA-C  OZEMPIC, 0.25 OR 0.5 MG/DOSE, 2 MG/1.5ML [facility]  02/20/19   [provider]  silver sulfADIAZINE (SILVADENE) 1 % cream Apply 1 application topically daily. 03/03/19   Trula Slade, DPM  topiramate (TOPAMAX) 100 MG tablet  02/17/19   [provider]  triamcinolone cream (KENALOG) 0.1 % Apply 1 application topically 2 (two) times daily. 09/21/18   Hoyt Koch, MD  warfarin (COUMADIN) 5 MG tablet Take 1 (5 mg) tablet every day except for Wednesday & Saturday when you take 7.15m (1.5 tablets) OR As directed. 07/05/19   CHoyt Koch MD     Family History  Problem Relation Age of Onset  . Hypertension Mother   . Diabetes Father     Social History   Socioeconomic History  . Marital status: Divorced    Spouse name: Not on file  . Number of children: 0  . Years of education:  College   . Highest education level: Not on file  Occupational History    Employer: UNEMPLOYED  Social Needs  . Financial resource strain: Not on file  . Food insecurity    Worry: Not on file    Inability: Not on file  . Transportation needs    Medical: Not on file    Non-medical: Not on file  Tobacco Use  . Smoking status: Never Smoker  . Smokeless tobacco: Never Used  Substance and Sexual Activity  . Alcohol use: No    Alcohol/week: 0.0 standard drinks  . Drug use: No  . Sexual activity: Not Currently  Lifestyle  . Physical activity    Days per week: Not on file    Minutes per session: Not on file  . Stress: Not on file  Relationships  . Social [place] phone: Not on file    Gets together: Not on file    Attends religious service: Not on file    Active member of club or organization: Not on file    Attends meetings of clubs or organizations: Not on file    Relationship status: Not on file  Other Topics Concern  . Not on file  Social History Narrative   Patient is Divorced.   Patient does not have any children.   Patient is right-handed   Patient is currently in college working on his Ph.D.        Review of Systems: A 12 point ROS discussed and pertinent positives are indicated in the HPI above.  All other systems are negative.  Review of Systems  Constitutional: Negative for chills and fever.  Respiratory: Negative for cough and shortness of breath.   Cardiovascular: Negative for chest pain.  Gastrointestinal: Positive for abdominal pain and nausea. Negative for blood in stool, diarrhea and vomiting.  Musculoskeletal: Negative for back pain.  Skin: Negative for rash and wound.       (+) bruising right upper arm  Neurological: Negative for dizziness and headaches.    Vital Signs: BP 136/69   Pulse 90   Temp (!) 97.2 F (36.2 C) (Skin)   Resp 20   Ht _0  (  1.778 m)   Wt 286 lb 9.6 oz (130 kg)   SpO2 100%   BMI 41.12 kg/m   Physical  Exam Vitals signs reviewed.  Constitutional:      General: He is not in acute distress.    Appearance: He is obese.     Comments: Very pleasant, good historian.  HENT:     Mouth/Throat:     Mouth: Mucous membranes are moist.     Pharynx: Oropharynx is clear. No oropharyngeal exudate or posterior oropharyngeal erythema.  Cardiovascular:     Rate and Rhythm: Normal rate and regular rhythm.     Comments: (+) RUE AVF (superior portion of upper arm) - diffuse ecchymosis, scabbing over several previous puncture sites. No palpable thrill or audible bruit. No open wounds, erythema or edema.  (+) RUE graft (directly below AVF) - no bruising or obvious puncture sites. Palpable thrill, no audible bruit. No open wounds, erythema or edema.  Pulmonary:     Effort: Pulmonary effort is normal.     Breath sounds: Normal breath sounds.  Abdominal:     General: Bowel sounds are normal. There is no distension.     Palpations: Abdomen is soft.     Tenderness: There is no abdominal tenderness.  Skin:    General: Skin is warm and dry.     Findings: Bruising (RUE) present.  Neurological:     Mental Status: He is alert and oriented to person, place, and time.  Psychiatric:        Mood and Affect: Mood normal.        Behavior: Behavior normal.        Thought Content: Thought content normal.        Judgment: Judgment normal.      MD Evaluation Airway: WNL Heart: WNL Abdomen: WNL Chest/ Lungs: WNL ASA  Classification: 3 Mallampati/Airway Score: Two   Imaging: No results found.  Labs:  CBC: Recent Labs    09/27/18 1232 06/02/19 0905 06/13/19 0634 07/21/19 1121 08/01/19 0746  WBC 7.2  --   --  7.8 8.3  HGB 11.5* 13.6 13.6 13.2 12.8*  HCT 38.3* 40.0 40.0 42.2 42.8  PLT 264  --   --  241.0 266    COAGS: Recent Labs    07/13/19 07/21/19 08/01/19 08/01/19 0746  INR 3.7* 3.0* 2.1 2.4*    BMP: Recent Labs    09/27/18 1232  06/02/19 0905 06/02/19 0915 06/13/19 0634 07/21/19  1121 08/01/19 0746  NA 139  --  133* 135 136 136 138  K 4.9  --  7.8* 4.9 5.5* 4.0 5.4*  CL 98  --  102 97*  --  94* 97*  CO2 26  --   --  23  --  29 23  GLUCOSE 247*   < > 178* 184* 121* 335* 132*  BUN 30*  --  51* 32*  --  36* 58*  CALCIUM 8.7*  --   --  9.0  --  9.4 9.3  CREATININE 6.15*  --  8.00* 7.78*  --  7.62* 12.86*  GFRNONAA 9*  --   --  7*  --   --  4*  GFRAA 11*  --   --  8*  --   --  4*   < > = values in this interval not displayed.    LIVER FUNCTION TESTS: Recent Labs    07/21/19 1121  BILITOT 1.1  AST 10  ALT 6  ALKPHOS 56  PROT  7.5  ALBUMIN 3.9    TUMOR MARKERS: No results for input(s): AFPTM, CEA, CA199, CHROMGRNA in the last 8760 hours.  Assessment and Plan:  53 y/o M with history of ESRD on HD T/Th/S with a RUE AVF as well as RUE graft - patient is not sure which is currently being used or which is having issues with HD, however he believes the graft was used on Saturday and stopped working after ~2 hours. There is a palpable thrill to the graft but no bruit, there is no obvious puncture marks on the graft on my exam today. The AVF has diffuse ecchymosis and scabbing over previous puncture sites; there is no palpable thrill or audible bruit. Above was discussed with Dr. Kathlene Cote - will plan on declot of newer graft.  Patient has been NPO since 8 pm last night, he did not take any medications this morning, last dose of coumadin was yesterday. Afebrile, WBC 8.3,hgb 12.8, plt 266, creatinine 12.86, INR 2.4.  Risks and benefits discussed with the patient including, but not limited to bleeding, infection, vascular injury, pulmonary embolism, need for tunneled HD catheter placement or even death.  All of the patient's questions were answered, patient is agreeable to proceed. Consent signed and in chart.  Thank you for this interesting consult.  I greatly enjoyed meeting [name] [name] and look forward to participating in their care.  A copy of this report was  sent to the requesting provider on this date.  Electronically Signed: Joaquim Nam, PA-C 08/01/2019, 8:47 AM   I spent a total of  15 Minutes  in face to face in clinical consultation, greater than 50% of which was counseling/coordinating care for fistulagram with possible intervention.

## 2019-08-01 NOTE — Procedures (Signed)
Interventional Radiology Procedure Note  Procedure: Right upper arm dialysis AVGG declot with angioplasty  Complications: None  Estimated Blood Loss: 10-20 mL  Findings: Right brachial a to axillary v AVGG declot with AngioJet and PTA of venous anastamosis of venous anastamosis and graft to 7 mm. Arterial plug pulled with 4 Fr Fogarty catheter. Graft and outflow widely patent on completion.  Venetia Night. Kathlene Cote, M.D Pager:  [phone]

## 2019-08-01 NOTE — Discharge Instructions (Addendum)
Dialysis Vascular Access Malfunction        A vascular access is an entrance to your blood vessels that can be used for dialysis. Dialysis is a treatment used for kidney failure. A health care provider can make a vascular access in many ways, such as by:  Joining an artery to a vein under your skin to make a bigger blood vessel called a fistula.  Joining an artery to a vein under your skin using a soft tube called a graft.  Placing a thin, flexible tube (catheter) in a large vein, usually in your neck, chest, or groin. A vascular access can become blocked or stop working correctly (malfunction). What can cause my vascular access to malfunction?  Infection. This is the most common cause of malfunction.  A blood clot inside a part of the fistula, graft, or catheter. A blood clot can completely or partially block the flow of blood.  A kink in the graft or catheter.  A collection of blood (hematoma or bruise) next to the graft or catheter that pushes against it, blocking the flow of blood. What are signs and symptoms of vascular access malfunction? Signs and symptoms of vascular access malfunction include:  A change in the vibration or pulse (thrill) of your fistula or graft.  The thrill of your fistula or graft being gone.  New or unusual swelling of the area around the access.  An unsuccessful puncture of your access by the dialysis team.  The flow of blood through the fistula, graft, or catheter being too slow for effective dialysis.  Bleeding that cannot be easily controlled when routine dialysis is completed and the needle is removed.  Signs of infection such as pain, swelling, redness, red streaks, numbness, and blood or pus coming from or around the access. What happens if my vascular access malfunctions? If your vascular access malfunctions, your health care provider may order blood work, cultures, or an X-ray test to find out what went wrong. The X-ray test involves the  injection of a dye (contrast) into the vascular access. The contrast shows up on the X-ray and lets your health care provider see if there is a blockage in the vascular access. Treatment varies depending on the cause of the malfunction:  If the vascular access is infected, your health care provider may prescribe antibiotic medicine to control the infection.  If a clot is found in the vascular access, you may need surgery to remove the clot. Blood thinning medicines may also be used.  If a blockage in the vascular access is due to some other cause, such as a kink in a graft, you will likely need surgery to unblock or replace the graft.  If there is a malfunction for any reason, your health care provider may remove and replace your access. Follow these instructions at home: Medicines  Take over-the-counter and prescription medicines only as told by your health care provider.  If you were prescribed an antibiotic medicine, use it as told by your health care provider. Do not stop using the antibiotic even if you start to feel better. Activity  Do not lift heavy things with the arm that has the access.  Try not to bump or hurt the arm that has the access. Lifestyle  Do not wear jewelry or tight clothing around the access.  Do not sleep by placing the access arm under your head or body. General instructions  Wash your hands with soap and water before touching the area around the   vascular access. If soap and water are not available, use hand sanitizer. °· Keep all follow-up visits as told by your health care provider. This is very important. Any delay in follow-up could cause permanent dysfunction of the vascular access, which may be dangerous. °· Do not measure your blood pressure on the arm that has the access. °· Keep the access area clean and do not use makeup, creams, lotions, or ointments on it. °· Check your access area every day for signs of infection. Check for: °? More redness,  swelling, or pain. °? More fluid or blood. °? Warmth. °? Pus or a bad smell. °Contact a health care provider if: °· Swelling around the vascular access gets worse. °· You develop new pain. °Get help right away if: °· You have bleeding at the vascular access that cannot be easily controlled. °· You have pain, numbness, an unusual pale skin color, or blue fingers or sores at the tips of your fingers in the hand on the side of your fistula. °· You have chills. °· You have a fever. °· You have pus or other fluid (drainage) at the vascular access site. °· You develop skin redness or red streaking on the skin around, above, or below the vascular access. °· Your access is hot, swollen, red, and very painful. °· You can suddenly see the cuff of your catheter used in the access. °Summary °· A vascular access is an entrance to your blood vessels that can be used for dialysis. °· Several things can cause your vascular access to malfunction. °· Treatment varies depending on the cause of the malfunction. °· Keep all follow-up visits as told by your health care provider. Any delay in follow-up could cause permanent dysfunction of the vascular access, which may be dangerous. °This information is not intended to replace advice given to you by your health care provider. Make sure you discuss any questions you have with your health care provider. °Document Released: 08/31/2006 Document Revised: 12/24/2017 Document Reviewed: 10/23/2016 °Elsevier Patient Education © 2020 Else[employer] Inc[employer] °Moderate Conscious Sedation, Adult, Care After °These instructions provide you with information about caring for yourself after your procedure. Your health care provider may also give you more specific instructions. Your treatment has been planned according to current medical practices, but problems sometimes occur. Call your health care provider if you have any problems or questions after your procedure. °What can I expect after the procedure? °After  your procedure, it is common: °· To feel sleepy for several hours. °· To feel clumsy and have poor balance for several hours. °· To have poor judgment for several hours. °· To vomit if you eat too soon. °Follow these instructions at home: °For at least 24 hours after the procedure: ° °· Do not: °? Participate in activities where you could fall or become injured. °? Drive. °? Use heavy machinery. °? Drink alcohol. °? Take sleeping pills or medicines that cause drowsiness. °? Make important decisions or sign legal documents. °? Take care of children on your own. °· Rest. °Eating and drinking °· Follow the diet recommended by your health care provider. °· If you vomit: °? Drink water, juice, or soup when you can drink without vomiting. °? Make sure you have little or no nausea before eating solid foods. °General instructions °· Have a responsible adult stay with you until you are awake and alert. °· Take over-the-counter and prescription medicines only as told by your health care provider. °· If you smoke, do not smoke   without supervision. °· Keep all follow-up visits as told by your health care provider. This is important. °Contact a health care provider if: °· You keep feeling nauseous or you keep vomiting. °· You feel light-headed. °· You develop a rash. °· You have a fever. °Get help right away if: °· You have trouble breathing. °This information is not intended to replace advice given to you by your health care provider. Make sure you discuss any questions you have with your health care provider. °Document Released: 07/19/2013 Document Revised: 09/10/2017 Document Reviewed: 01/18/2016 °Elsevier Patient Education © 2020 Elsevier Inc. ° °

## 2019-08-02 DIAGNOSIS — T782XXS Anaphylactic shock, unspecified, sequela: Secondary | ICD-10-CM | POA: Diagnosis not present

## 2019-08-02 DIAGNOSIS — E875 Hyperkalemia: Secondary | ICD-10-CM | POA: Diagnosis not present

## 2019-08-02 DIAGNOSIS — Z992 Dependence on renal dialysis: Secondary | ICD-10-CM | POA: Diagnosis not present

## 2019-08-02 DIAGNOSIS — N186 End stage renal disease: Secondary | ICD-10-CM | POA: Diagnosis not present

## 2019-08-02 DIAGNOSIS — D509 Iron deficiency anemia, unspecified: Secondary | ICD-10-CM | POA: Diagnosis not present

## 2019-08-02 DIAGNOSIS — N2581 Secondary hyperparathyroidism of renal origin: Secondary | ICD-10-CM | POA: Diagnosis not present

## 2019-08-03 DIAGNOSIS — N2581 Secondary hyperparathyroidism of renal origin: Secondary | ICD-10-CM | POA: Diagnosis not present

## 2019-08-03 DIAGNOSIS — N186 End stage renal disease: Secondary | ICD-10-CM | POA: Diagnosis not present

## 2019-08-03 DIAGNOSIS — I82492 Acute embolism and thrombosis of other specified deep vein of left lower extremity: Secondary | ICD-10-CM | POA: Diagnosis not present

## 2019-08-03 DIAGNOSIS — T782XXS Anaphylactic shock, unspecified, sequela: Secondary | ICD-10-CM | POA: Diagnosis not present

## 2019-08-03 DIAGNOSIS — D509 Iron deficiency anemia, unspecified: Secondary | ICD-10-CM | POA: Diagnosis not present

## 2019-08-03 DIAGNOSIS — Z992 Dependence on renal dialysis: Secondary | ICD-10-CM | POA: Diagnosis not present

## 2019-08-03 DIAGNOSIS — E875 Hyperkalemia: Secondary | ICD-10-CM | POA: Diagnosis not present

## 2019-08-05 DIAGNOSIS — E875 Hyperkalemia: Secondary | ICD-10-CM | POA: Diagnosis not present

## 2019-08-05 DIAGNOSIS — D509 Iron deficiency anemia, unspecified: Secondary | ICD-10-CM | POA: Diagnosis not present

## 2019-08-05 DIAGNOSIS — N186 End stage renal disease: Secondary | ICD-10-CM | POA: Diagnosis not present

## 2019-08-05 DIAGNOSIS — T782XXS Anaphylactic shock, unspecified, sequela: Secondary | ICD-10-CM | POA: Diagnosis not present

## 2019-08-05 DIAGNOSIS — Z992 Dependence on renal dialysis: Secondary | ICD-10-CM | POA: Diagnosis not present

## 2019-08-05 DIAGNOSIS — N2581 Secondary hyperparathyroidism of renal origin: Secondary | ICD-10-CM | POA: Diagnosis not present

## 2019-08-07 ENCOUNTER — Encounter: Payer: Self-pay | Admitting: Podiatry

## 2019-08-07 ENCOUNTER — Other Ambulatory Visit: Payer: Self-pay | Admitting: Endocrinology

## 2019-08-07 ENCOUNTER — Telehealth: Payer: Self-pay

## 2019-08-07 ENCOUNTER — Ambulatory Visit (INDEPENDENT_AMBULATORY_CARE_PROVIDER_SITE_OTHER): Payer: Medicare Other | Admitting: Podiatry

## 2019-08-07 ENCOUNTER — Other Ambulatory Visit: Payer: Self-pay

## 2019-08-07 DIAGNOSIS — E875 Hyperkalemia: Secondary | ICD-10-CM | POA: Diagnosis not present

## 2019-08-07 DIAGNOSIS — L84 Corns and callosities: Secondary | ICD-10-CM | POA: Diagnosis not present

## 2019-08-07 DIAGNOSIS — T782XXS Anaphylactic shock, unspecified, sequela: Secondary | ICD-10-CM | POA: Diagnosis not present

## 2019-08-07 DIAGNOSIS — E1149 Type 2 diabetes mellitus with other diabetic neurological complication: Secondary | ICD-10-CM | POA: Diagnosis not present

## 2019-08-07 DIAGNOSIS — Z992 Dependence on renal dialysis: Secondary | ICD-10-CM | POA: Diagnosis not present

## 2019-08-07 DIAGNOSIS — N2581 Secondary hyperparathyroidism of renal origin: Secondary | ICD-10-CM | POA: Diagnosis not present

## 2019-08-07 DIAGNOSIS — N186 End stage renal disease: Secondary | ICD-10-CM | POA: Diagnosis not present

## 2019-08-07 DIAGNOSIS — D509 Iron deficiency anemia, unspecified: Secondary | ICD-10-CM | POA: Diagnosis not present

## 2019-08-07 DIAGNOSIS — L97521 Non-pressure chronic ulcer of other part of left foot limited to breakdown of skin: Secondary | ICD-10-CM

## 2019-08-07 MED ORDER — MUPIROCIN 2 % EX OINT: 1.0000 "application " | TOPICAL_OINTMENT | Freq: Two times a day (BID) | CUTANEOUS | 2 refills | Status: DC

## 2019-08-07 NOTE — Telephone Encounter (Signed)
Copied from [place] [phone]. Topic: General - Other >> Aug 07, 2019  3:27 PM Carolyn Stare wrote: Pt said at his 07/21/2019 he told the doctor he need something to calm down and he said she told hi she would call him a RX, he contacted [facility] order and they had no RX for him

## 2019-08-08 DIAGNOSIS — D509 Iron deficiency anemia, unspecified: Secondary | ICD-10-CM | POA: Diagnosis not present

## 2019-08-08 DIAGNOSIS — Z992 Dependence on renal dialysis: Secondary | ICD-10-CM | POA: Diagnosis not present

## 2019-08-08 DIAGNOSIS — E875 Hyperkalemia: Secondary | ICD-10-CM | POA: Diagnosis not present

## 2019-08-08 DIAGNOSIS — T782XXS Anaphylactic shock, unspecified, sequela: Secondary | ICD-10-CM | POA: Diagnosis not present

## 2019-08-08 DIAGNOSIS — N2581 Secondary hyperparathyroidism of renal origin: Secondary | ICD-10-CM | POA: Diagnosis not present

## 2019-08-08 DIAGNOSIS — N186 End stage renal disease: Secondary | ICD-10-CM | POA: Diagnosis not present

## 2019-08-08 NOTE — Telephone Encounter (Signed)
Patient states that he did get it filled by another provider but was told to get it filled by primary care doctor that they are not filling it anymore. States that the clonazepam is not working anymore that he is getting very anxious when having to lay down. That causes him not to be able to sleep. Also gets way too anxious at dialysis and cannot stay the whole time. Is supposed to stay for 4 1/2 hours and most times can only get to 3 hours. That is why he was wanting to help with the nerves. Is not sure why Dr. Lorrene Reid is no longer going to prescribe it.

## 2019-08-08 NOTE — Telephone Encounter (Signed)
Patient states he is wanting a medication to help calm him down during dialis.

## 2019-08-08 NOTE — Telephone Encounter (Signed)
I don't believe we talked about sending in an rx. We talked about sleeping is he wanting a sleeping medication.

## 2019-08-08 NOTE — Telephone Encounter (Signed)
He is already getting a benzodiazepine from another provider I believe for this purpose.

## 2019-08-08 NOTE — Telephone Encounter (Signed)
LVM with Md response  And to call back

## 2019-08-09 MED ORDER — BUSPIRONE HCL 5 MG PO TABS: 5.0000 mg | ORAL_TABLET | Freq: Two times a day (BID) | ORAL | 2 refills | Status: DC | PRN

## 2019-08-09 NOTE — Telephone Encounter (Signed)
We have sent in a medicine called buspar to try up to twice a day for anxiety to see if this helps.

## 2019-08-09 NOTE — Telephone Encounter (Signed)
Pt want this medication to go to [facility] and not [facility]

## 2019-08-09 NOTE — Telephone Encounter (Addendum)
Left message for patient to call back to inform of below.  

## 2019-08-09 NOTE — Addendum Note (Signed)
Addended by: [name] A on: 08/09/2019 10:01 AM   Modules accepted: Orders

## 2019-08-10 DIAGNOSIS — T782XXS Anaphylactic shock, unspecified, sequela: Secondary | ICD-10-CM | POA: Diagnosis not present

## 2019-08-10 DIAGNOSIS — N2581 Secondary hyperparathyroidism of renal origin: Secondary | ICD-10-CM | POA: Diagnosis not present

## 2019-08-10 DIAGNOSIS — Z992 Dependence on renal dialysis: Secondary | ICD-10-CM | POA: Diagnosis not present

## 2019-08-10 DIAGNOSIS — N186 End stage renal disease: Secondary | ICD-10-CM | POA: Diagnosis not present

## 2019-08-10 DIAGNOSIS — I82492 Acute embolism and thrombosis of other specified deep vein of left lower extremity: Secondary | ICD-10-CM | POA: Diagnosis not present

## 2019-08-10 DIAGNOSIS — E875 Hyperkalemia: Secondary | ICD-10-CM | POA: Diagnosis not present

## 2019-08-10 DIAGNOSIS — D509 Iron deficiency anemia, unspecified: Secondary | ICD-10-CM | POA: Diagnosis not present

## 2019-08-10 MED ORDER — BUSPIRONE HCL 5 MG PO TABS: 5.0000 mg | ORAL_TABLET | Freq: Two times a day (BID) | ORAL | 0 refills | Status: DC | PRN

## 2019-08-10 NOTE — Addendum Note (Signed)
Addended by: Pollyann Glen on: 08/10/2019 08:11 AM   Modules accepted: Orders

## 2019-08-10 NOTE — Telephone Encounter (Signed)
Sent to [facility] 

## 2019-08-11 NOTE — Progress Notes (Signed)
Subjective: 52 year old male presents the office today for follow-up evaluation of ulcer of the distal aspect the left big toe.  Overall he states is doing much better.  Denies any swelling or redness or any drainage or pus.  No pain.  He has been keeping antibiotic ointment on the area daily.  No new lesions are identified. Denies any systemic complaints such as fevers, chills, nausea, vomiting. No acute changes since last appointment, and no other complaints at this time.   Objective: AAO x3, NAD DP/PT pulses palpable bilaterally, CRT less than 3 seconds Hyperkeratotic lesion at the distal aspect left hallux.  There is hyperkeratotic tissue with dried blood.  However upon debridement there is no ongoing ulceration identified at this time.  There is no drainage or pus any underlying ulceration or fluctuation/crepitation.  No malodor.  No other open lesions identified at this time. No pain with calf compression, swelling, warmth, erythema  Assessment: Left hallux ulceration which is healed  Plan: -All treatment options discussed with the patient including all alternatives, risks, complications.  -Debrided hyperkeratotic lesions today.  Normal dried blood present underneath the callus but there is no open sore identified.  Recommended to monitor for any signs or symptoms of worsening or signs of infection.  Continue offloading at all times.  He will continue antibiotic ointment.  Return in about 3 weeks (around 08/28/2019).  Trula Slade DPM

## 2019-08-12 DIAGNOSIS — E875 Hyperkalemia: Secondary | ICD-10-CM | POA: Diagnosis not present

## 2019-08-12 DIAGNOSIS — T782XXS Anaphylactic shock, unspecified, sequela: Secondary | ICD-10-CM | POA: Diagnosis not present

## 2019-08-12 DIAGNOSIS — D509 Iron deficiency anemia, unspecified: Secondary | ICD-10-CM | POA: Diagnosis not present

## 2019-08-12 DIAGNOSIS — N2581 Secondary hyperparathyroidism of renal origin: Secondary | ICD-10-CM | POA: Diagnosis not present

## 2019-08-12 DIAGNOSIS — Z992 Dependence on renal dialysis: Secondary | ICD-10-CM | POA: Diagnosis not present

## 2019-08-12 DIAGNOSIS — N186 End stage renal disease: Secondary | ICD-10-CM | POA: Diagnosis not present

## 2019-08-13 DIAGNOSIS — Z992 Dependence on renal dialysis: Secondary | ICD-10-CM | POA: Diagnosis not present

## 2019-08-13 DIAGNOSIS — E1122 Type 2 diabetes mellitus with diabetic chronic kidney disease: Secondary | ICD-10-CM | POA: Diagnosis not present

## 2019-08-13 DIAGNOSIS — N186 End stage renal disease: Secondary | ICD-10-CM | POA: Diagnosis not present

## 2019-08-13 LAB — PROTIME-INR: INR: 2.1 — AB (ref <=1.1)

## 2019-08-15 DIAGNOSIS — Z992 Dependence on renal dialysis: Secondary | ICD-10-CM | POA: Diagnosis not present

## 2019-08-15 DIAGNOSIS — D509 Iron deficiency anemia, unspecified: Secondary | ICD-10-CM | POA: Diagnosis not present

## 2019-08-15 DIAGNOSIS — N2581 Secondary hyperparathyroidism of renal origin: Secondary | ICD-10-CM | POA: Diagnosis not present

## 2019-08-15 DIAGNOSIS — E875 Hyperkalemia: Secondary | ICD-10-CM | POA: Diagnosis not present

## 2019-08-15 DIAGNOSIS — E1122 Type 2 diabetes mellitus with diabetic chronic kidney disease: Secondary | ICD-10-CM | POA: Diagnosis not present

## 2019-08-15 DIAGNOSIS — N186 End stage renal disease: Secondary | ICD-10-CM | POA: Diagnosis not present

## 2019-08-16 ENCOUNTER — Telehealth: Payer: Self-pay | Admitting: Endocrinology

## 2019-08-16 ENCOUNTER — Other Ambulatory Visit: Payer: Self-pay

## 2019-08-16 MED ORDER — TOUJEO MAX SOLOSTAR 300 UNIT/ML ~~LOC~~ SOPN: 70.0000 [IU] | PEN_INJECTOR | Freq: Two times a day (BID) | SUBCUTANEOUS | 1 refills | Status: DC

## 2019-08-16 MED ORDER — HUMULIN R U-500 KWIKPEN 500 UNIT/ML ~~LOC~~ SOPN: PEN_INJECTOR | SUBCUTANEOUS | 3 refills | Status: DC

## 2019-08-16 MED ORDER — INSULIN PEN NEEDLE 32G X 8 MM MISC: 1 refills | Status: AC

## 2019-08-16 MED ORDER — ACCU-CHEK SMARTVIEW VI STRP: ORAL_STRIP | 5 refills | Status: DC

## 2019-08-16 NOTE — Telephone Encounter (Signed)
Patient called to request that the following prescriptions be sent to [facility] as 90 days prescriptions for cost saving measures:  Toujeo Humulin R  Pen Needles (need to be the longer pen needles)  Accu Chek Smartview Ttest Strips

## 2019-08-16 NOTE — Telephone Encounter (Signed)
Rx sent 

## 2019-08-17 DIAGNOSIS — M109 Gout, unspecified: Secondary | ICD-10-CM | POA: Diagnosis not present

## 2019-08-17 DIAGNOSIS — N186 End stage renal disease: Secondary | ICD-10-CM | POA: Diagnosis not present

## 2019-08-17 DIAGNOSIS — E875 Hyperkalemia: Secondary | ICD-10-CM | POA: Diagnosis not present

## 2019-08-17 DIAGNOSIS — Z992 Dependence on renal dialysis: Secondary | ICD-10-CM | POA: Diagnosis not present

## 2019-08-17 DIAGNOSIS — D509 Iron deficiency anemia, unspecified: Secondary | ICD-10-CM | POA: Diagnosis not present

## 2019-08-17 DIAGNOSIS — I82492 Acute embolism and thrombosis of other specified deep vein of left lower extremity: Secondary | ICD-10-CM | POA: Diagnosis not present

## 2019-08-17 DIAGNOSIS — N2581 Secondary hyperparathyroidism of renal origin: Secondary | ICD-10-CM | POA: Diagnosis not present

## 2019-08-19 DIAGNOSIS — N186 End stage renal disease: Secondary | ICD-10-CM | POA: Diagnosis not present

## 2019-08-19 DIAGNOSIS — N2581 Secondary hyperparathyroidism of renal origin: Secondary | ICD-10-CM | POA: Diagnosis not present

## 2019-08-19 DIAGNOSIS — Z992 Dependence on renal dialysis: Secondary | ICD-10-CM | POA: Diagnosis not present

## 2019-08-19 DIAGNOSIS — D509 Iron deficiency anemia, unspecified: Secondary | ICD-10-CM | POA: Diagnosis not present

## 2019-08-19 DIAGNOSIS — E875 Hyperkalemia: Secondary | ICD-10-CM | POA: Diagnosis not present

## 2019-08-22 ENCOUNTER — Ambulatory Visit (INDEPENDENT_AMBULATORY_CARE_PROVIDER_SITE_OTHER): Payer: Medicare Other | Admitting: General Practice

## 2019-08-22 DIAGNOSIS — Z7901 Long term (current) use of anticoagulants: Secondary | ICD-10-CM | POA: Diagnosis not present

## 2019-08-22 DIAGNOSIS — E875 Hyperkalemia: Secondary | ICD-10-CM | POA: Diagnosis not present

## 2019-08-22 DIAGNOSIS — Z992 Dependence on renal dialysis: Secondary | ICD-10-CM | POA: Diagnosis not present

## 2019-08-22 DIAGNOSIS — N2581 Secondary hyperparathyroidism of renal origin: Secondary | ICD-10-CM | POA: Diagnosis not present

## 2019-08-22 DIAGNOSIS — D509 Iron deficiency anemia, unspecified: Secondary | ICD-10-CM | POA: Diagnosis not present

## 2019-08-22 DIAGNOSIS — N186 End stage renal disease: Secondary | ICD-10-CM | POA: Diagnosis not present

## 2019-08-22 NOTE — Progress Notes (Signed)
Medical treatment/procedure(s) were performed by non-physician practitioner and as supervising physician I was immediately available for consultation/collaboration. I agree with above. Elizabeth A Crawford, MD  

## 2019-08-22 NOTE — Patient Instructions (Signed)
Pre visit review using our clinic review tool, if applicable. No additional management support is needed unless otherwise documented below in the visit note. Fax received on 11/10.  Continue to take 1 tablet daily except 1/2 tablet on Wednesdays.   Dosing instructions left on VM.   Re-check in 1 to 2 weeks at dialysis

## 2019-08-24 DIAGNOSIS — N186 End stage renal disease: Secondary | ICD-10-CM | POA: Diagnosis not present

## 2019-08-24 DIAGNOSIS — D509 Iron deficiency anemia, unspecified: Secondary | ICD-10-CM | POA: Diagnosis not present

## 2019-08-24 DIAGNOSIS — I82492 Acute embolism and thrombosis of other specified deep vein of left lower extremity: Secondary | ICD-10-CM | POA: Diagnosis not present

## 2019-08-24 DIAGNOSIS — N2581 Secondary hyperparathyroidism of renal origin: Secondary | ICD-10-CM | POA: Diagnosis not present

## 2019-08-24 DIAGNOSIS — E875 Hyperkalemia: Secondary | ICD-10-CM | POA: Diagnosis not present

## 2019-08-24 DIAGNOSIS — Z992 Dependence on renal dialysis: Secondary | ICD-10-CM | POA: Diagnosis not present

## 2019-08-24 LAB — PROTIME-INR: INR: 2.3 — AB (ref <=1.1)

## 2019-08-25 ENCOUNTER — Other Ambulatory Visit: Payer: Self-pay | Admitting: *Deleted

## 2019-08-25 MED ORDER — HYDROXYZINE HCL 25 MG PO TABS: 25.0000 mg | ORAL_TABLET | Freq: Three times a day (TID) | ORAL | 0 refills | Status: DC | PRN

## 2019-08-25 NOTE — Telephone Encounter (Signed)
Ok to stop buspar.  Taken off medication list.

## 2019-08-25 NOTE — Telephone Encounter (Signed)
Copied from [place] #[id number]. Topic: General - Inquiry >> Aug 23, 2019  3:52 PM Scherrie Gerlach wrote: Reason for CRM:  pt states the busPIRone (BUSPAR) 5 MG tablet is not working for him.  Pt states he cannot sleep, feeling like he cannot stay in bed, he would sit down on bed and get up, and felt like he did this 36,000 times. Pt was restless and weak. This seems to be happening at bedtime.  Does not want to take any more!

## 2019-08-25 NOTE — Telephone Encounter (Signed)
Please advise in PCP's absence. See 08/07/19 tele encounter. Thanks!

## 2019-08-25 NOTE — Telephone Encounter (Signed)
Lets try hydroxyzine - pending.   He can discuss with clonazepam or alternative with Dr Sharlet Salina when she is back in the office since the clonazepam was not working well.

## 2019-08-25 NOTE — Telephone Encounter (Signed)
Pt informed of below.  He states his nephrologist is no longer prescribing his clonazepam 0.5 mg. He states he has dialysis again tomorrow and would like something to help him stay calm.He is very anxious and can not stay still the whole time and sometimes he leaves early.  He is aware PCP is out of office until 08/30/19. Is there anything we can do for him before then?  Controlled database checked. He last filled Clonazepam 07/21/19 # 109 Rx'd by Dr. Lorrene Reid.

## 2019-08-25 NOTE — Telephone Encounter (Signed)
Pt informed of below. Rx sent to [facility].

## 2019-08-26 DIAGNOSIS — N186 End stage renal disease: Secondary | ICD-10-CM | POA: Diagnosis not present

## 2019-08-26 DIAGNOSIS — E875 Hyperkalemia: Secondary | ICD-10-CM | POA: Diagnosis not present

## 2019-08-26 DIAGNOSIS — N2581 Secondary hyperparathyroidism of renal origin: Secondary | ICD-10-CM | POA: Diagnosis not present

## 2019-08-26 DIAGNOSIS — D509 Iron deficiency anemia, unspecified: Secondary | ICD-10-CM | POA: Diagnosis not present

## 2019-08-26 DIAGNOSIS — Z992 Dependence on renal dialysis: Secondary | ICD-10-CM | POA: Diagnosis not present

## 2019-08-28 ENCOUNTER — Ambulatory Visit (INDEPENDENT_AMBULATORY_CARE_PROVIDER_SITE_OTHER): Payer: Medicare Other | Admitting: Podiatry

## 2019-08-28 ENCOUNTER — Other Ambulatory Visit: Payer: Self-pay

## 2019-08-28 DIAGNOSIS — E1149 Type 2 diabetes mellitus with other diabetic neurological complication: Secondary | ICD-10-CM | POA: Diagnosis not present

## 2019-08-28 DIAGNOSIS — L84 Corns and callosities: Secondary | ICD-10-CM

## 2019-08-29 ENCOUNTER — Ambulatory Visit (INDEPENDENT_AMBULATORY_CARE_PROVIDER_SITE_OTHER): Payer: Medicare Other | Admitting: General Practice

## 2019-08-29 DIAGNOSIS — D509 Iron deficiency anemia, unspecified: Secondary | ICD-10-CM | POA: Diagnosis not present

## 2019-08-29 DIAGNOSIS — N186 End stage renal disease: Secondary | ICD-10-CM | POA: Diagnosis not present

## 2019-08-29 DIAGNOSIS — Z7901 Long term (current) use of anticoagulants: Secondary | ICD-10-CM

## 2019-08-29 DIAGNOSIS — N2581 Secondary hyperparathyroidism of renal origin: Secondary | ICD-10-CM | POA: Diagnosis not present

## 2019-08-29 DIAGNOSIS — Z992 Dependence on renal dialysis: Secondary | ICD-10-CM | POA: Diagnosis not present

## 2019-08-29 DIAGNOSIS — E875 Hyperkalemia: Secondary | ICD-10-CM | POA: Diagnosis not present

## 2019-08-29 NOTE — Patient Instructions (Signed)
Pre visit review using our clinic review tool, if applicable. No additional management support is needed unless otherwise documented below in the visit note.  Fax received on 11/17.  Continue to take 1 tablet daily except 1/2 tablet on Wednesdays.   Dosing instructions left on VM.   Re-check in 1 to 2 weeks at dialysis

## 2019-08-29 NOTE — Progress Notes (Signed)
Medical screening examination/treatment/procedure(s) were performed by non-physician practitioner and as supervising physician I was immediately available for consultation/collaboration. I agree with above. James John, MD   

## 2019-08-31 DIAGNOSIS — I82492 Acute embolism and thrombosis of other specified deep vein of left lower extremity: Secondary | ICD-10-CM | POA: Diagnosis not present

## 2019-08-31 DIAGNOSIS — N2581 Secondary hyperparathyroidism of renal origin: Secondary | ICD-10-CM | POA: Diagnosis not present

## 2019-08-31 DIAGNOSIS — N186 End stage renal disease: Secondary | ICD-10-CM | POA: Diagnosis not present

## 2019-08-31 DIAGNOSIS — E875 Hyperkalemia: Secondary | ICD-10-CM | POA: Diagnosis not present

## 2019-08-31 DIAGNOSIS — D509 Iron deficiency anemia, unspecified: Secondary | ICD-10-CM | POA: Diagnosis not present

## 2019-08-31 DIAGNOSIS — Z992 Dependence on renal dialysis: Secondary | ICD-10-CM | POA: Diagnosis not present

## 2019-09-02 DIAGNOSIS — D509 Iron deficiency anemia, unspecified: Secondary | ICD-10-CM | POA: Diagnosis not present

## 2019-09-02 DIAGNOSIS — E875 Hyperkalemia: Secondary | ICD-10-CM | POA: Diagnosis not present

## 2019-09-02 DIAGNOSIS — N2581 Secondary hyperparathyroidism of renal origin: Secondary | ICD-10-CM | POA: Diagnosis not present

## 2019-09-02 DIAGNOSIS — Z992 Dependence on renal dialysis: Secondary | ICD-10-CM | POA: Diagnosis not present

## 2019-09-02 DIAGNOSIS — N186 End stage renal disease: Secondary | ICD-10-CM | POA: Diagnosis not present

## 2019-09-03 NOTE — Progress Notes (Signed)
Subjective: 53 year old male presents the office today for calluses to both feet and need to be trimmed as well as his right big toenail starting to come off and had some bleeding around the area.  Denies any drainage or pus or any increase in swelling to his feet.  He denies any open sores. Denies any systemic complaints such as fevers, chills, nausea, vomiting. No acute changes since last appointment, and no other complaints at this time.   Objective: AAO x3, NAD DP/PT pulses palpable bilaterally, CRT less than 3 seconds Hyperkeratotic lesion at the distal aspect left hallux as well as the right medial first digit.  Upon debridement there is no underlying ulceration, drainage or any signs of infection noted today.  There is dried blood present of the distal aspect the right hallux without any underlying ulceration that I can identify.  There is no drainage or pus.  The nail is somewhat loose and underlying nail bed distally. No pain with calf compression, swelling, warmth, erythema  Assessment: Preulcerative calluses; onycholysis  Plan: -All treatment options discussed with the patient including all alternatives, risks, complications.  -Debrided hyperkeratotic lesions today x 2 without any complications or bleeding.  I also debrided the right hallux toenail any complications or bleeding.  Recommend moisturizer and offloading daily.  Recommended to monitor for any signs or symptoms of worsening or signs of infection.  Continue offloading at all times.  He will continue antibiotic ointment.  Return in about 4 weeks (around 09/25/2019).  Trula Slade DPM

## 2019-09-04 ENCOUNTER — Ambulatory Visit: Payer: Medicare Other | Admitting: Endocrinology

## 2019-09-04 DIAGNOSIS — N2581 Secondary hyperparathyroidism of renal origin: Secondary | ICD-10-CM | POA: Diagnosis not present

## 2019-09-04 DIAGNOSIS — Z992 Dependence on renal dialysis: Secondary | ICD-10-CM | POA: Diagnosis not present

## 2019-09-04 DIAGNOSIS — N186 End stage renal disease: Secondary | ICD-10-CM | POA: Diagnosis not present

## 2019-09-04 DIAGNOSIS — D509 Iron deficiency anemia, unspecified: Secondary | ICD-10-CM | POA: Diagnosis not present

## 2019-09-04 DIAGNOSIS — E875 Hyperkalemia: Secondary | ICD-10-CM | POA: Diagnosis not present

## 2019-09-06 ENCOUNTER — Telehealth: Payer: Self-pay

## 2019-09-06 ENCOUNTER — Telehealth: Payer: Self-pay | Admitting: Internal Medicine

## 2019-09-06 DIAGNOSIS — E875 Hyperkalemia: Secondary | ICD-10-CM | POA: Diagnosis not present

## 2019-09-06 DIAGNOSIS — I82492 Acute embolism and thrombosis of other specified deep vein of left lower extremity: Secondary | ICD-10-CM | POA: Diagnosis not present

## 2019-09-06 DIAGNOSIS — N2581 Secondary hyperparathyroidism of renal origin: Secondary | ICD-10-CM | POA: Diagnosis not present

## 2019-09-06 DIAGNOSIS — Z992 Dependence on renal dialysis: Secondary | ICD-10-CM | POA: Diagnosis not present

## 2019-09-06 DIAGNOSIS — D509 Iron deficiency anemia, unspecified: Secondary | ICD-10-CM | POA: Diagnosis not present

## 2019-09-06 DIAGNOSIS — N186 End stage renal disease: Secondary | ICD-10-CM | POA: Diagnosis not present

## 2019-09-06 NOTE — Telephone Encounter (Signed)
Recd faxed copy from kidney dialysis stating INR 11/20---reading 1.65 with normal range to be 0.9-1.10----per dr burns (in the absence of dr crawford)--I have left voicemail advising patient can take a full pill coumadin today (instead of 1/2 pill), then resume normal dosing for the rest of week, and make sure to be rechecked at next kidney dialysis visit---

## 2019-09-06 NOTE — Telephone Encounter (Signed)
[name] [name] is out of the office until 09/11/2019  Home INR results 09/01/19  1.65 INR reference range .90-1.10

## 2019-09-09 DIAGNOSIS — N186 End stage renal disease: Secondary | ICD-10-CM | POA: Diagnosis not present

## 2019-09-09 DIAGNOSIS — E875 Hyperkalemia: Secondary | ICD-10-CM | POA: Diagnosis not present

## 2019-09-09 DIAGNOSIS — Z992 Dependence on renal dialysis: Secondary | ICD-10-CM | POA: Diagnosis not present

## 2019-09-09 DIAGNOSIS — D509 Iron deficiency anemia, unspecified: Secondary | ICD-10-CM | POA: Diagnosis not present

## 2019-09-09 DIAGNOSIS — N2581 Secondary hyperparathyroidism of renal origin: Secondary | ICD-10-CM | POA: Diagnosis not present

## 2019-09-11 ENCOUNTER — Other Ambulatory Visit: Payer: Self-pay

## 2019-09-11 ENCOUNTER — Telehealth: Payer: Self-pay | Admitting: Endocrinology

## 2019-09-11 MED ORDER — HUMULIN R U-500 KWIKPEN 500 UNIT/ML ~~LOC~~ SOPN: PEN_INJECTOR | SUBCUTANEOUS | 0 refills | Status: DC

## 2019-09-11 NOTE — Telephone Encounter (Signed)
This was addressed by Dr. Quay Burow

## 2019-09-11 NOTE — Telephone Encounter (Signed)
Patient requests RX for insulin regular human CONCENTRATED (HUMULIN R U-500 KWIKPEN) 500 UNIT/ML kwikpen be changed due to patient uses 40 units in the a.m., 70 units at lunch and 60 units every night. Patient's prescriptions are only lasting 18 days. Patient requests that his RX for the above medication last at least 1 month. Patient requests that the above RX be adjusted and sent to:  [facility], [place] [phone] (Phone) [phone] (Fax)

## 2019-09-11 NOTE — Telephone Encounter (Signed)
Rx updated and sent.

## 2019-09-12 ENCOUNTER — Ambulatory Visit: Payer: Medicare Other | Admitting: Endocrinology

## 2019-09-14 DIAGNOSIS — I82492 Acute embolism and thrombosis of other specified deep vein of left lower extremity: Secondary | ICD-10-CM | POA: Diagnosis not present

## 2019-09-15 DIAGNOSIS — Z992 Dependence on renal dialysis: Secondary | ICD-10-CM | POA: Diagnosis not present

## 2019-09-15 DIAGNOSIS — N186 End stage renal disease: Secondary | ICD-10-CM | POA: Diagnosis not present

## 2019-09-15 DIAGNOSIS — T82858A Stenosis of vascular prosthetic devices, implants and grafts, initial encounter: Secondary | ICD-10-CM | POA: Diagnosis not present

## 2019-09-15 DIAGNOSIS — I871 Compression of vein: Secondary | ICD-10-CM | POA: Diagnosis not present

## 2019-09-21 DIAGNOSIS — M109 Gout, unspecified: Secondary | ICD-10-CM | POA: Diagnosis not present

## 2019-09-21 DIAGNOSIS — I82492 Acute embolism and thrombosis of other specified deep vein of left lower extremity: Secondary | ICD-10-CM | POA: Diagnosis not present

## 2019-09-22 DIAGNOSIS — I871 Compression of vein: Secondary | ICD-10-CM | POA: Diagnosis not present

## 2019-09-22 DIAGNOSIS — T82858A Stenosis of vascular prosthetic devices, implants and grafts, initial encounter: Secondary | ICD-10-CM | POA: Diagnosis not present

## 2019-09-22 DIAGNOSIS — Z992 Dependence on renal dialysis: Secondary | ICD-10-CM | POA: Diagnosis not present

## 2019-09-22 DIAGNOSIS — N186 End stage renal disease: Secondary | ICD-10-CM | POA: Diagnosis not present

## 2019-09-22 LAB — PROTIME-INR: INR: 2.8 — AB (ref <=1.1)

## 2019-09-26 ENCOUNTER — Ambulatory Visit (INDEPENDENT_AMBULATORY_CARE_PROVIDER_SITE_OTHER): Payer: Medicare Other | Admitting: General Practice

## 2019-09-26 DIAGNOSIS — Z7901 Long term (current) use of anticoagulants: Secondary | ICD-10-CM | POA: Diagnosis not present

## 2019-09-26 NOTE — Progress Notes (Signed)
Medical treatment/procedure(s) were performed by non-physician practitioner and as supervising physician I was immediately available for consultation/collaboration. I agree with above. Dalyah Pla A Que Meneely, MD  

## 2019-09-26 NOTE — Patient Instructions (Addendum)
Pre visit review using our clinic review tool, if applicable. No additional management support is needed unless otherwise documented below in the visit note.  Fax received on 12/15.  Continue to take 1 tablet daily except 1/2 tablet on Wednesdays.   Dosing instructions left on VM.   Re-check in 1 to 2 weeks at dialysis

## 2019-10-09 ENCOUNTER — Other Ambulatory Visit: Payer: Self-pay | Admitting: *Deleted

## 2019-10-09 ENCOUNTER — Other Ambulatory Visit: Payer: Self-pay

## 2019-10-09 ENCOUNTER — Encounter: Payer: Self-pay | Admitting: Endocrinology

## 2019-10-09 ENCOUNTER — Ambulatory Visit (INDEPENDENT_AMBULATORY_CARE_PROVIDER_SITE_OTHER): Payer: Medicare Other | Admitting: Endocrinology

## 2019-10-09 VITALS — BP 90/40 | HR 78 | Ht 70.0 in | Wt 290.0 lb

## 2019-10-09 DIAGNOSIS — Z794 Long term (current) use of insulin: Secondary | ICD-10-CM | POA: Diagnosis not present

## 2019-10-09 DIAGNOSIS — E1165 Type 2 diabetes mellitus with hyperglycemia: Secondary | ICD-10-CM

## 2019-10-09 MED ORDER — ACCU-CHEK FASTCLIX LANCETS MISC: 4 refills | Status: DC

## 2019-10-09 MED ORDER — ACCU-CHEK GUIDE VI STRP: 1.0000 | ORAL_STRIP | 1 refills | Status: DC | PRN

## 2019-10-09 NOTE — Patient Instructions (Signed)
Call when you have app on phoone set up and send readings on PC  Check blood sugars on waking up 5 days a week  Also check blood sugars about 2 hours after meals and do this after different meals by rotation  Recommended blood sugar levels on waking up are 90-130 and about 2 hours after meal is 130-180  Please bring your blood sugar monitor to each visit, thank you

## 2019-10-09 NOTE — Progress Notes (Signed)
Patient ID: [name] [name], male   DOB: Jun 20, 1966, 53 y.o.   MRN: [id number]           Reason for Appointment: Follow-up for Type 2 Diabetes  History of Present Illness:          Date of diagnosis of type 2 diabetes mellitus : 1990       Background history:  He has been taking insulin since about 1999, previously was on unknown oral agents He had good control with insulin initially but A1c was much higher in 2008 and subsequently has had required larger doses of insulin He has been on Lantus for the last few years along with Novolog,followed by an endocrinologist since 2008 His A1c was 6.3 in 04/2014   Prior to his consultation he was on a regimen of Novolog , 50 units 5 times a day with each meal and snack along with 90 Units 3 times a day of Lantus   Recent history:   INSULIN regimen is :TOUJEO 80 units twice daily  Humulin R U-500 insulin with KwikPen 50 units before breakfast, 70 before lunch and 60 before dinner  Previous Sliding scale use for high sugars: Sugar 150-199 take 2 units, 200-250= 4 units and over 250 take 6 units   Most recently A1C is 7.2, previously same also   Current blood sugar patterns and problems identified:   He did not come for follow-up after his A1c was checked in October  Also forgot to bring his meter today  Not clear which brand of meter he is using but he wants to send his results remotely  On his last visit he was concerned about blood sugar being higher after waking up but he thinks these are better now  His suppertime insulin was increased to 70 units for his Humulin R and he has had better readings with this  No hypoglycemia reported  Although he is giving inconsistent answers he thinks his blood sugars are usually not high after dinner but not clear how often he checks them  He is also concerned about sometimes insulin coming out from his abdominal injection site after he finishes the injection process; however he appears to be  keeping the insulin pen only for about 3 seconds after the injection  Does not think he has any heart issue or hard areas on his abdominal injection sites  He may occasionally have higher readings before dinnertime but he thinks that this is when he is more anxious and does not think this is consistent  Also blood sugars are not usually high at lunchtime  Usually not eating large portions, and as before is adding some nuts with his breakfast for protein    Compliance with the medical regimen: Inconsistent   Glucose monitoring:  done 0-2 times a day         Glucometer: Accu-Chek       Blood Glucose readings by recall:   PRE-MEAL Fasting Lunch Dinner Bedtime Overall  Glucose range: 111-120 140 120-200 160   Mean/median:        Previous readings:   PRE-MEAL Fasting Lunch Dinner Bedtime Overall  Glucose range:  78-258  138-298  168-298 ?   Mean/median:     ?      Self-care: The diet that the patient has been following is: tries to limit high-fat meals, usually eating  2-3 meals a day.     Typical meal intake: Breakfast is grits with pecans, olive oil. Dinner 5-6 pm  Dietician visit, most recent:2013               Exercise:  unable to do any, In wheelchair  Weight history:  Wt Readings from Last 3 Encounters:  10/09/19 290 lb (131.5 kg)  08/01/19 286 lb 9.6 oz (130 kg)  07/21/19 291 lb (132 kg)    Glycemic control:   Lab Results  Component Value Date   HGBA1C 7.2 (H) 07/21/2019   HGBA1C 7.2 (H) 04/17/2019   HGBA1C 7.4 (A) 07/05/2018   Lab Results  Component Value Date   MICROALBUR 232.9 (H) 06/19/2015   LDLCALC 36 07/21/2019   CREATININE 12.86 (H) 08/01/2019    Lab Results  Component Value Date   FRUCTOSAMINE 352 (H) 09/12/2018   FRUCTOSAMINE 408 (H) 07/23/2017   FRUCTOSAMINE 408 (H) 05/19/2017      Allergies as of 10/09/2019      Reactions   Pork-derived Products Anaphylaxis, Other (See Comments)   Fever also    Ace Inhibitors  Cough   Hydrocodone Other (See Comments)   Hallucinations   Pork Allergy    Other reaction(s): Unknown      Medication List       Accurate as of October 09, 2019 10:42 AM. If you have any questions, ask your nurse or doctor.        STOP taking these medications   clindamycin 300 MG capsule Commonly known as: Cleocin Stopped by: Elayne Snare, MD     TAKE these medications   Accu-Chek Guide test strip Generic drug: glucose blood 1 each by Other route as needed for other. Use as instructed to check blood sugar 4 times daily. DX:E11.65   Accu-Chek SmartView test strip Generic drug: glucose blood Use to test 4 times daily DX code E11.29   allopurinol 100 MG tablet Commonly known as: ZYLOPRIM Take 1 tablet (100 mg total) by mouth 3 (three) times a week. After dialysis   ammonium lactate 12 % cream Commonly known as: AMLACTIN Apply topically as needed for dry skin.   calcium acetate 667 MG capsule Commonly known as: PHOSLO Take 3 capsules (2,001 mg total) by mouth 3 (three) times daily with meals.   Droplet Pen Needles 32G X 4 MM Misc Generic drug: Insulin Pen Needle USE  TO INJECT FIVE TIMES DAILY AS DIRECTED   Insulin Pen Needle 32G X 8 MM Misc Use with insulin x5 times a day   heparin 1000 UNIT/ML injection Heparin Sodium (Porcine) 1,000 Units/mL Catheter Lock Venous   HumuLIN R U-500 KwikPen 500 UNIT/ML kwikpen Generic drug: insulin regular human CONCENTRATED Inject 40 units for breakfast 70 at lunch and 60 at supper-inject 30 minutes before each meal   hydrocortisone-pramoxine rectal foam Commonly known as: PROCTOFOAM-HC Place 1 applicator rectally 3 (three) times daily. Prn hemorrhoids   hydrOXYzine 25 MG tablet Commonly known as: ATARAX/VISTARIL Take 1 tablet (25 mg total) by mouth 3 (three) times daily as needed for anxiety.   midodrine 10 MG tablet Commonly known as: PROAMATINE Take 10 mg by mouth as needed (low blood pressure).   mupirocin ointment  2 % Commonly known as: BACTROBAN Apply 1 application topically 2 (two) times daily.   omeprazole 20 MG capsule Commonly known as: PRILOSEC TAKE 1 CAPSULE EVERY DAY   ONE TOUCH LANCETS Misc Use to test sugar 4 times daily   oxyCODONE-acetaminophen 5-325 MG tablet Commonly known as: PERCOCET/ROXICET Take 1 tablet by mouth every 6 (six) hours as needed.   Ozempic (0.25 or 0.5 MG/DOSE)  2 MG/1.5ML Sopn Generic drug: Semaglutide(0.25 or 0.5MG/DOS)   Renvela 800 MG tablet Generic drug: sevelamer carbonate Take 2,400 mg by mouth 3 (three) times daily with meals.   rosuvastatin 20 MG tablet Commonly known as: CRESTOR TAKE 1 TABLET AT BEDTIME   silver sulfADIAZINE 1 % cream Commonly known as: Silvadene Apply 1 application topically daily.   Suvorexant 10 MG Tabs Commonly known as: Belsomra Take 10 mg by mouth at bedtime.   topiramate 100 MG tablet Commonly known as: TOPAMAX   Toujeo Max SoloStar 300 UNIT/ML Sopn Generic drug: Insulin Glargine (2 Unit Dial) Inject 70-80 Units into the skin 2 (two) times daily. Inject 80 units under the skin in the morning and 70 units in the evening. What changed: how much to take   triamcinolone cream 0.1 % Commonly known as: KENALOG Apply 1 application topically 2 (two) times daily.   Veltassa 8.4 g packet Generic drug: patiromer Take 8.4 g by mouth daily.   warfarin 5 MG tablet Commonly known as: COUMADIN Take as directed by the anticoagulation clinic. If you are unsure how to take this medication, talk to your nurse or doctor. Original instructions: Take 1 (5 mg) tablet every day except for Wednesday & Saturday when you take 7.43m (1.5 tablets) OR As directed.       Allergies:  Allergies  Allergen Reactions  . Pork-Derived Products Anaphylaxis and Other (See Comments)    Fever also   . Ace Inhibitors Cough  . Hydrocodone Other (See Comments)    Hallucinations  . Pork Allergy     Other reaction(s): Unknown    Past Medical  History:  Diagnosis Date  . Allergic rhinitis   . Anemia   . Aortic insufficiency    a. Echo 7/16:  Mod LVH, EF 50-55%, mild to mod AI, severe LAE, PASP 57 mmHg (LV ID end diastolic 599.3mm);  b. Echo 2/17:  Mod LVH, EF 50-55%, mod AI, MAC, mild MR, mod LAE, mild to mod TR, PASP 63 mmHg  . Bilateral carpal tunnel syndrome   . CHF (congestive heart failure) ([place]   . Claustrophobia   . COPD (chronic obstructive pulmonary disease) ([facility]   . Diabetes mellitus    Type II  . Diabetic peripheral neuropathy ([place]   . Diabetic retinopathy ([place]   . Discitis of lumbar region    resolved  . DVT (deep venous thrombosis) ([facility] [place] 10/07/2015   LLE  . Endocarditis 11/28/14  . ESRD (end stage renal disease) on dialysis ([facility]    s/p renal transplant in 2009, failed in 2016  . Gait disorder   . Gastroparesis   . GERD (gastroesophageal reflux disease)   . Gout   . Hearing difficulty   . Hyperlipidemia   . Hypertension   . Incomplete bladder emptying   . Leg pain 02/05/11   with walking  . Morbid obesity ([facility]   . Obstructive sleep apnea    not using CPAP- lost 100lbs  . Osteomyelitis ([facility]   . Posttraumatic stress disorder   . Rotator cuff disorder   . Secondary hyperparathyroidism ([place]   . Sepsis ([place]    Postive blood cultures -   . Urethral stricture   . Vitamin D deficiency     Past Surgical History:  Procedure Laterality Date  . ARTERIOVENOUS GRAFT PLACEMENT Bilateral "several"  . AV FISTULA PLACEMENT Bilateral 2015  . AV FISTULA PLACEMENT Right 06/13/2019   Procedure: INSERTION OF ARTERIOVENOUS (AV) GORE-TEX GRAFT RIGHT  ARM;  Surgeon:  Elam Dutch, MD;  Location: [facility] OR;  Service: Vascular;  Laterality: Right;  . BASCILIC VEIN TRANSPOSITION Right 09/27/2015  . [place] Right 09/27/2015   Procedure: BASILIC VEIN TRANSPOSITION right;  Surgeon: Mal Misty, MD;  Location: [facility];  Service: Vascular;  Laterality: Right;  . CATARACT EXTRACTION W/ INTRAOCULAR  LENS  IMPLANT, BILATERAL Bilateral   . CYSTOSCOPY W/ INTERNAL URETHROTOMY  09/2014  . IR THROMBECTOMY AV FISTULA W/THROMBOLYSIS/PTA INC/SHUNT/IMG RIGHT Right 08/01/2019  . IR US GUIDE VASC ACCESS RIGHT  08/01/2019  . KIDNEY TRANSPLANT  2009  . UPPER EXTREMITY VENOGRAPHY Bilateral 06/02/2019   Procedure: UPPER EXTREMITY VENOGRAPHY;  Surgeon: Elam Dutch, MD;  Location: [facility];  Service: Cardiovascular;  Laterality: Bilateral;    Family History  Problem Relation Age of Onset  . Hypertension Mother   . Diabetes Father     Social History:  reports that he has never smoked. He has never used smokeless tobacco. He reports that he does not drink alcohol or use drugs.    Review of Systems    ROS   Lipid history: He is on 20 mg Crestor with the following labs    Lab Results  Component Value Date   CHOL 93 07/21/2019   HDL 28.00 (L) 07/21/2019   LDLCALC 36 07/21/2019   TRIG 146.0 07/21/2019   CHOLHDL 3 07/21/2019            Hypertension: His blood pressure is managed by nephrologist and PCP   RENAL: He has had a renal transplant in 2009 and  he is on dialysis  Neurological: Has has had  decreased sensation in his feet, also complaining of tingling and burning Previously followed by neurologist  He has had severe problems with balance and also some weakness in his legs, using a power scooter  Last foot exam showed: Absent monofilament sensation in his feet and toes and extending up to the middle of the lower leg on the left and up to the ankle on the right  LABS:  No visits with results within 1 Week(s) from this visit.  Latest known visit with results is:  Anti-coag visit on 09/26/2019  Component Date Value Ref Range Status  . INR 09/22/2019 2.8* 0.9 - 1.1 Final   INR reported by [facility]    Physical Examination:  BP (!) 90/40 (BP Location: Left Arm, Patient Position: Sitting, Cuff Size: Large)   Pulse 78   Ht _0  (1.778 m)   Wt 290 lb  (131.5 kg)   BMI 41.61 kg/m      ASSESSMENT:  Diabetes type 2, long-standing with obesity  See history of present illness for detailed discussion of his current management, blood sugar patterns and problems identified.  His A1c was in October was 7.2 However previously fructosamine is 352 which is relatively more accurate for him  His meter was not downloaded as he did not bring it He probably does have variability in his blood sugars as before based on his diet and action of Humulin R Also he thinks that blood sugars are higher with stress especially in the afternoon As before not able to adjust his mealtime dose proactively based on how much carbohydrate he is getting Fasting readings are reportedly fairly good with no hypoglycemia reported  A1c is reasonably good but needs fructosamine also to help assess his control He can also do better with glucose monitoring and may be eligible for freestyle libre if he checks  4 times a day     PLAN:  No basic change in insulin regimen as yet Reminded him to inject Humulin R 30-minute before eating If he starts getting higher readings after dinner he will increase his suppertime Humulin R and reduce Toujeo at the same time   There are no Patient Instructions on file for this visit.      Elayne Snare 10/09/2019, 10:42 AM   Note: This office note was prepared with Dragon voice recognition system technology. Any transcriptional errors that result from this process are unintentional.

## 2019-10-15 DIAGNOSIS — N186 End stage renal disease: Secondary | ICD-10-CM | POA: Diagnosis not present

## 2019-10-15 DIAGNOSIS — N2581 Secondary hyperparathyroidism of renal origin: Secondary | ICD-10-CM | POA: Diagnosis not present

## 2019-10-15 DIAGNOSIS — Z992 Dependence on renal dialysis: Secondary | ICD-10-CM | POA: Diagnosis not present

## 2019-10-16 ENCOUNTER — Telehealth: Payer: Self-pay | Admitting: Internal Medicine

## 2019-10-16 ENCOUNTER — Telehealth: Payer: Self-pay | Admitting: Physician Assistant

## 2019-10-16 ENCOUNTER — Telehealth: Payer: Self-pay

## 2019-10-16 MED ORDER — HYDROCORT-PRAMOXINE (PERIANAL) 1-1 % EX FOAM: 1.0000 | Freq: Two times a day (BID) | CUTANEOUS | 0 refills | Status: DC

## 2019-10-16 MED ORDER — HYDROCORTISONE 1 % EX OINT: 1.0000 "application " | TOPICAL_OINTMENT | Freq: Two times a day (BID) | CUTANEOUS | 0 refills | Status: DC

## 2019-10-16 NOTE — Telephone Encounter (Signed)
See previous phone note, it looks like this was addressed and Rx for proctofoam sent into pharmacy.

## 2019-10-16 NOTE — Telephone Encounter (Signed)
Pt reported that he is experiencing painful hemorrhoids.  Please advise.

## 2019-10-16 NOTE — Telephone Encounter (Signed)
Per verbally speaking with Anderson Malta she approved sending in Hydrocortisone 1% cream and he may apply it over top of a prep-H suppository BID. I sent it in and explained to Mr [name] [name] what we were doing and he verbalized understanding. I called the pharmacy and the cream will be under $2.00. The proctofoam-HC was going to be over  $500.00. We will see him at his office visit 10/19/2018.

## 2019-10-16 NOTE — Telephone Encounter (Signed)
Today Proctoform - HC 1% sent in . See phone notes from today. Drug store says not covered but Procto-Pak is . May we send in that? If so please give me sig, etc. Thank you. Patient has been put on [name] [name]'s schedule for 10/20/2019.

## 2019-10-16 NOTE — Telephone Encounter (Signed)
Patient will come in and see [name], PA on 10/20/19.  I will send in a refill of the proctofoam from last year.  He is out.  He was not able to complete his dialysis tx today due to the pain

## 2019-10-17 DIAGNOSIS — Z992 Dependence on renal dialysis: Secondary | ICD-10-CM | POA: Diagnosis not present

## 2019-10-17 DIAGNOSIS — N186 End stage renal disease: Secondary | ICD-10-CM | POA: Diagnosis not present

## 2019-10-17 DIAGNOSIS — N2581 Secondary hyperparathyroidism of renal origin: Secondary | ICD-10-CM | POA: Diagnosis not present

## 2019-10-20 ENCOUNTER — Other Ambulatory Visit: Payer: Self-pay

## 2019-10-20 ENCOUNTER — Encounter: Payer: Self-pay | Admitting: Physician Assistant

## 2019-10-20 ENCOUNTER — Ambulatory Visit (INDEPENDENT_AMBULATORY_CARE_PROVIDER_SITE_OTHER): Payer: Medicare HMO | Admitting: Physician Assistant

## 2019-10-20 ENCOUNTER — Telehealth: Payer: Self-pay

## 2019-10-20 ENCOUNTER — Encounter: Payer: Self-pay | Admitting: Internal Medicine

## 2019-10-20 ENCOUNTER — Other Ambulatory Visit (INDEPENDENT_AMBULATORY_CARE_PROVIDER_SITE_OTHER): Payer: Medicare HMO

## 2019-10-20 VITALS — BP 120/62 | HR 68 | Temp 96.0°F | Ht 70.0 in | Wt 286.6 lb

## 2019-10-20 DIAGNOSIS — R197 Diarrhea, unspecified: Secondary | ICD-10-CM | POA: Diagnosis not present

## 2019-10-20 DIAGNOSIS — K6289 Other specified diseases of anus and rectum: Secondary | ICD-10-CM

## 2019-10-20 DIAGNOSIS — Z992 Dependence on renal dialysis: Secondary | ICD-10-CM | POA: Diagnosis not present

## 2019-10-20 DIAGNOSIS — N186 End stage renal disease: Secondary | ICD-10-CM | POA: Diagnosis not present

## 2019-10-20 DIAGNOSIS — N2581 Secondary hyperparathyroidism of renal origin: Secondary | ICD-10-CM | POA: Diagnosis not present

## 2019-10-20 LAB — CBC WITH DIFFERENTIAL/PLATELET
Basophils Absolute: 0 10*3/uL (ref 0.0–0.1)
Basophils Relative: 0.3 % (ref 0.0–3.0)
Eosinophils Absolute: 0 10*3/uL (ref 0.0–0.7)
Eosinophils Relative: 0.1 % (ref 0.0–5.0)
HCT: 36.8 % — ABNORMAL LOW (ref 39.0–52.0)
Hemoglobin: 11.5 g/dL — ABNORMAL LOW (ref 13.0–17.0)
Lymphocytes Relative: 16.2 % (ref 12.0–46.0)
Lymphs Abs: 1 10*3/uL (ref 0.7–4.0)
MCHC: 31.3 g/dL (ref 30.0–36.0)
MCV: 86.8 fl (ref 78.0–100.0)
Monocytes Absolute: 0.4 10*3/uL (ref 0.1–1.0)
Monocytes Relative: 6.8 % (ref 3.0–12.0)
Neutro Abs: 4.7 10*3/uL (ref 1.4–7.7)
Neutrophils Relative %: 76.6 % (ref 43.0–77.0)
Platelets: 182 10*3/uL (ref 150.0–400.0)
RBC: 4.24 Mil/uL (ref 4.22–5.81)
RDW: 20.3 % — ABNORMAL HIGH (ref 11.5–15.5)
WBC: 6.2 10*3/uL (ref 4.0–10.5)

## 2019-10-20 LAB — COMPREHENSIVE METABOLIC PANEL
ALT: 11 U/L (ref 0–53)
AST: 17 U/L (ref 0–37)
Albumin: 3.5 g/dL (ref 3.5–5.2)
Alkaline Phosphatase: 49 U/L (ref 39–117)
BUN: 69 mg/dL — ABNORMAL HIGH (ref 6–23)
CO2: 23 mEq/L (ref 19–32)
Calcium: 6.7 mg/dL — ABNORMAL LOW (ref 8.4–10.5)
Chloride: 91 mEq/L — ABNORMAL LOW (ref 96–112)
Creatinine, Ser: 14.58 mg/dL (ref 0.40–1.50)
GFR: 3.51 mL/min — CL (ref >60.00)
Glucose, Bld: 356 mg/dL — ABNORMAL HIGH (ref 70–99)
Potassium: 5.3 mEq/L — ABNORMAL HIGH (ref 3.5–5.1)
Sodium: 136 mEq/L (ref 135–145)
Total Bilirubin: 0.9 mg/dL (ref 0.2–1.2)
Total Protein: 7.2 g/dL (ref 6.0–8.3)

## 2019-10-20 LAB — POCT INR

## 2019-10-20 MED ORDER — LOPERAMIDE HCL 2 MG PO CAPS: 2.0000 mg | ORAL_CAPSULE | ORAL | 0 refills | Status: DC | PRN

## 2019-10-20 NOTE — Progress Notes (Signed)
Chief Complaint: Rectal pain, diarrhea  HPI:     Mr.  Welles is a 54 year old male with a past medical history as listed below including aortic insufficiency, CHF, COPD, diabetes, ESRD on HD and multiple others, known to Dr. Carlean Purl, who was referred to me by Hoyt Koch, * for a complaint of diarrhea and rectal pain.      04/20/2019 patient had a telemedicine visit and described having diarrhea after meals and a painful rectum.  At that time was recommended he have an office evaluation.    10/16/2019 patient was sent in hydrocortisone 1% cream to be applied to Preparation H suppository twice daily for rectal pain as the Proctofoam was too expensive.    Today, the patient presents to clinic and is a very poor historian.  Tells me that he has had diarrhea for a long time and that this is making his bottom irritated and painful.  Tells me it is hard for him to even sit in his chair which makes it hard for him to have dialysis.  Apparently received the Hydrocortisone a couple of days ago but only used this once it did not feel like it helped anything.  Tells me he wants a pill to stop things.  He cannot quantify for me how many times he goes a day but just tells me it is bad.  I asked if things are "just coming out" and he said yes.  In general patient tells me he just feels bad, somewhat dizzy and just ill.  (Of note patient did not request an interpreter though I feel like it would be beneficial in the future).    Denies fever, chills, abdominal pain or blood in his stool.  GI history:  Flex sig 2011 - soild stool but was having diarrhea EGD 2011 - retained food   Past Medical History:  Diagnosis Date  . Allergic rhinitis   . Anemia   . Aortic insufficiency    a. Echo 7/16:  Mod LVH, EF 50-55%, mild to mod AI, severe LAE, PASP 57 mmHg (LV ID end diastolic 09.6 mm);  b. Echo 2/17:  Mod LVH, EF 50-55%, mod AI, MAC, mild MR, mod LAE, mild to mod TR, PASP 63 mmHg  . Bilateral carpal tunnel  syndrome   . CHF (congestive heart failure) ([place])   . Claustrophobia   . COPD (chronic obstructive pulmonary disease) ([place])   . Diabetes mellitus    Type II  . Diabetic peripheral neuropathy ([place])   . Diabetic retinopathy ([place])   . Discitis of lumbar region    resolved  . DVT (deep venous thrombosis) ([place]) 10/07/2015   LLE  . Endocarditis 11/28/14  . ESRD (end stage renal disease) on dialysis [facility])    s/p renal transplant in 2009, failed in 2016  . Gait disorder   . Gastroparesis   . GERD (gastroesophageal reflux disease)   . Gout   . Hearing difficulty   . Hyperlipidemia   . Hypertension   . Incomplete bladder emptying   . Leg pain 02/05/11   with walking  . Morbid obesity ([place])   . Obstructive sleep apnea    not using CPAP- lost 100lbs  . Osteomyelitis ([place])   . Posttraumatic stress disorder   . Rotator cuff disorder   . Secondary hyperparathyroidism ([place])   . Sepsis ([place])    Postive blood cultures -   . Urethral stricture   . Vitamin D deficiency     Past Surgical  History:  Procedure Laterality Date  . ARTERIOVENOUS GRAFT PLACEMENT Bilateral "several"  . AV FISTULA PLACEMENT Bilateral 2015  . AV FISTULA PLACEMENT Right 06/13/2019   Procedure: INSERTION OF ARTERIOVENOUS (AV) GORE-TEX GRAFT RIGHT  ARM;  Surgeon: Elam Dutch, MD;  Location: [facility];  Service: Vascular;  Laterality: Right;  . BASCILIC VEIN TRANSPOSITION Right 09/27/2015  . [place] Right 09/27/2015   Procedure: BASILIC VEIN TRANSPOSITION right;  Surgeon: Mal Misty, MD;  Location: [facility];  Service: Vascular;  Laterality: Right;  . CATARACT EXTRACTION W/ INTRAOCULAR LENS  IMPLANT, BILATERAL Bilateral   . CYSTOSCOPY W/ INTERNAL URETHROTOMY  09/2014  . IR THROMBECTOMY AV FISTULA W/THROMBOLYSIS/PTA INC/SHUNT/IMG RIGHT Right 08/01/2019  . IR US GUIDE VASC ACCESS RIGHT  08/01/2019  . KIDNEY TRANSPLANT  2009  . UPPER EXTREMITY VENOGRAPHY Bilateral 06/02/2019   Procedure: UPPER  EXTREMITY VENOGRAPHY;  Surgeon: Elam Dutch, MD;  Location: [facility];  Service: Cardiovascular;  Laterality: Bilateral;    Current Outpatient Medications  Medication Sig Dispense Refill  . Accu-Chek FastClix Lancets MISC USE TO CHECK BLOOD SUGARS 4 TIMES A DAY. 360 each 4  . allopurinol (ZYLOPRIM) 100 MG tablet Take 1 tablet (100 mg total) by mouth 3 (three) times a week. After dialysis 30 tablet 6  . ammonium lactate (AMLACTIN) 12 % cream Apply topically as needed for dry skin. 385 g 0  . calcium acetate (PHOSLO) 667 MG capsule Take 3 capsules (2,001 mg total) by mouth 3 (three) times daily with meals. 270 capsule 0  . DROPLET PEN NEEDLES 32G X 4 MM MISC USE  TO INJECT FIVE TIMES DAILY AS DIRECTED 500 each 1  . glucose blood (ACCU-CHEK GUIDE) test strip 1 each by Other route as needed for other. Use as instructed to check blood sugar 4 times daily. DX:E11.65 400 each 1  . heparin 1000 UNIT/ML injection Heparin Sodium (Porcine) 1,000 Units/mL Catheter Lock Venous    . hydrocortisone 1 % ointment Apply 1 application topically 2 (two) times daily. 30 g 0  . hydrocortisone-pramoxine (PROCTOFOAM-HC) rectal foam Place 1 applicator rectally 2 (two) times daily. 30 g 0  . hydrOXYzine (ATARAX/VISTARIL) 25 MG tablet Take 1 tablet (25 mg total) by mouth 3 (three) times daily as needed for anxiety. 30 tablet 0  . Insulin Glargine, 2 Unit Dial, (TOUJEO MAX SOLOSTAR) 300 UNIT/ML SOPN Inject 70-80 Units into the skin 2 (two) times daily. Inject 80 units under the skin in the morning and 70 units in the evening. (Patient taking differently: Inject 80 Units into the skin 2 (two) times daily. Inject 80 units under the skin in the morning and 70 units in the evening. ) 35 mL 1  . Insulin Pen Needle 32G X 8 MM MISC Use with insulin x5 times a day 500 each 1  . insulin regular human CONCENTRATED (HUMULIN R U-500 KWIKPEN) 500 UNIT/ML kwikpen Inject 40 units for breakfast 70 at lunch and 60 at  supper-inject 30 minutes before each meal 51 pen 0  . midodrine (PROAMATINE) 10 MG tablet Take 10 mg by mouth as needed (low blood pressure).     . mupirocin ointment (BACTROBAN) 2 % Apply 1 application topically 2 (two) times daily. 30 g 2  . omeprazole (PRILOSEC) 20 MG capsule TAKE 1 CAPSULE EVERY DAY 90 capsule 1  . oxyCODONE-acetaminophen (PERCOCET/ROXICET) 5-325 MG tablet Take 1 tablet by mouth every 6 (six) hours as needed. 12 tablet 0  . OZEMPIC, 0.25 OR 0.5  MG/DOSE, 2 MG/1.5ML SOPN     . RENVELA 800 MG tablet Take 2,400 mg by mouth 3 (three) times daily with meals.     . rosuvastatin (CRESTOR) 20 MG tablet TAKE 1 TABLET AT BEDTIME 90 tablet 1  . silver sulfADIAZINE (SILVADENE) 1 % cream Apply 1 application topically daily. 50 g 0  . Suvorexant (BELSOMRA) 10 MG TABS Take 10 mg by mouth at bedtime. 30 tablet 1  . topiramate (TOPAMAX) 100 MG tablet     . triamcinolone cream (KENALOG) 0.1 % Apply 1 application topically 2 (two) times daily. 100 g 2  . VELTASSA 8.4 g packet Take 8.4 g by mouth daily.     Marland Kitchen warfarin (COUMADIN) 5 MG tablet Take 1 (5 mg) tablet every day except for Wednesday & Saturday when you take 7.25m (1.5 tablets) OR As directed. 105 tablet 0   No current facility-administered medications for this visit.    Allergies as of 10/20/2019 - Review Complete 10/20/2019  Allergen Reaction Noted  . Pork-derived products Anaphylaxis and Other (See Comments) 10/21/2014  . Ace inhibitors Cough   . Hydrocodone Other (See Comments) 11/15/2015  . Pork allergy  07/03/2019    Family History  Problem Relation Age of Onset  . Hypertension Mother   . Diabetes Father     Social History   Socioeconomic History  . Marital status: Divorced    Spouse name: Not on file  . Number of children: 0  . Years of education: College   . Highest education level: Not on file  Occupational History    Employer: UNEMPLOYED  Tobacco Use  . Smoking status: Never Smoker  . Smokeless tobacco:  Never Used  Substance and Sexual Activity  . Alcohol use: No    Alcohol/week: 0.0 standard drinks  . Drug use: No  . Sexual activity: Not Currently  Other Topics Concern  . Not on file  Social History Narrative   Patient is Divorced.   Patient does not have any children.   Patient is right-handed   Patient is currently in college working on his Ph.D.      Social Determinants of Health   Financial Resource Strain:   . Difficulty of Paying Living Expenses: Not on file  Food Insecurity:   . Worried About RCharity fundraiserin the Last Year: Not on file  . Ran Out of Food in the Last Year: Not on file  Transportation Needs:   . Lack of Transportation (Medical): Not on file  . Lack of Transportation (Non-Medical): Not on file  Physical Activity:   . Days of Exercise per Week: Not on file  . Minutes of Exercise per Session: Not on file  Stress:   . Feeling of Stress : Not on file  Social Connections:   . Frequency of Communication with Friends and Family: Not on file  . Frequency of Social Gatherings with Friends and Family: Not on file  . Attends Religious Services: Not on file  . Active Member of Clubs or Organizations: Not on file  . Attends CArchivistMeetings: Not on file  . Marital Status: Not on file  Intimate Partner Violence:   . Fear of Current or Ex-Partner: Not on file  . Emotionally Abused: Not on file  . Physically Abused: Not on file  . Sexually Abused: Not on file    Review of Systems:    Constitutional: No weight loss, fever or chills Cardiovascular: No chest pain Respiratory: No SOB  Gastrointestinal: See HPI and otherwise negative Genitourinary: No dysuria  Neurological: +dizziness Musculoskeletal: No new muscle or joint pain Hematologic: No bleeding  Psychiatric: No history of depression or anxiety   Physical Exam:  Vital signs: BP 120/62   Pulse 68   Temp (!) 96 F (35.6 C)   Ht _0  (1.778 m)   Wt 286 lb 9.6 oz (130 kg)  Comment: Reported, wheelchair  BMI 41.12 kg/m   Constitutional:   Caucasian male appears to be in NAD, Well developed, Well nourished, alert and cooperative Respiratory: Respirations even and unlabored. Lungs clear to auscultation bilaterally.   No wheezes, crackles, or rhonchi.  Cardiovascular: Normal S1, S2. No MRG. Regular rate and rhythm. No peripheral edema, cyanosis or pallor.  Gastrointestinal:  Soft, nondistended, nontender. No rebound or guarding. Normal bowel sounds. No appreciable masses or hepatomegaly. Rectal:  Limited due to pain; external: excoriations and moist appearing skin between buttocks, no visualized fissure externally or hemorrhoids- patient with pain upon just separating buttocks; Internal exam: attempted but unsuccessful due to discomfort level Psychiatric: Demonstrates good judgement and reason without abnormal affect or behaviors.  No recent labs.  Assessment: 1.  Diarrhea: Sounds like this has been happening for a while per patient and his his biggest issue per him, he keeps requesting "a pill"; consider infectious cause versus other 2.  Rectal pain: Unsure if this is true rectal pain or just dermatitis between patient's buttocks, he is unable to tell me where the pain is truly coming from, rectal exam was incomplete today due to discomfort  Plan: 1.  I am not sure if patient is actually having rectal pain or if this is more of just a discomfort from dermatitis around area between his buttocks.  Would recommend the patient try and keep this dry, wiping well and possibly even placing a tissue back there to absorb moisture.  He should also apply a barrier cream such as Desitin or a and D after wiping. 2.  Ordered stool studies to include GI pathogen panel and C. difficile 3.  Prescribed Imodium 1 tab p.o. up to 4 times a day as needed for diarrhea.  Prescribed Loperamide # 30 with 2 refills. 4.  Ordered CMP and CBC as patient is feeling very ill today and somewhat  dizzy after rectal exam.  Also gave the patient some juice per his request.  He does tell me he is going to dialysis later today. 5.  Patient is due for a colonoscopy and with diarrhea this would also be a reason to pursue it.  We will wait on stool studies and pending results we will schedule for colonoscopy.  This would also allow [place] to get a better look at rectum as well, as unable to do so today. 6.  Encouraged the patient to continue Hydrocortisone on Preparation H suppositories if he is having true rectal pain. 7.  Patient does need interpreter at time of next visit. Timing per recommendations after testing above.  [name], PA-C [facility] 10/20/2019, 8:54 AM  Cc: Hoyt Koch, *

## 2019-10-20 NOTE — Patient Instructions (Addendum)
If you are age 56 or older, your body mass index should be between 23-30. Your Body mass index is 41.12 kg/m. If this is out of the aforementioned range listed, please consider follow up with your Primary Care Provider.  If you are age 32 or younger, your body mass index should be between 19-25. Your Body mass index is 41.12 kg/m. If this is out of the aformentioned range listed, please consider follow up with your Primary Care Provider.   Your provider has requested that you go to the basement level for lab work before leaving today. Press "B" on the elevator. The lab is located at the first door on the left as you exit the elevator.   Place a dry tissue over your bottom. Make sure to dry the area completely Place silver sulfadiazine all over your bottom when dry. (diaper cream Please go to your pharmacy and pick up the following medication Loperamide every 4 to 6 hours as needed.  Thank you for choosing me and [facility]

## 2019-10-20 NOTE — Telephone Encounter (Signed)
Called patient to give critical lab and make sure he is going to Dialysis today. Mail box is full and patient does not check his MyChart. Will try again later.

## 2019-10-21 DIAGNOSIS — N2581 Secondary hyperparathyroidism of renal origin: Secondary | ICD-10-CM | POA: Diagnosis not present

## 2019-10-21 DIAGNOSIS — N186 End stage renal disease: Secondary | ICD-10-CM | POA: Diagnosis not present

## 2019-10-21 DIAGNOSIS — Z992 Dependence on renal dialysis: Secondary | ICD-10-CM | POA: Diagnosis not present

## 2019-10-23 ENCOUNTER — Telehealth: Payer: Self-pay

## 2019-10-23 DIAGNOSIS — R0902 Hypoxemia: Secondary | ICD-10-CM | POA: Diagnosis not present

## 2019-10-23 DIAGNOSIS — I4510 Unspecified right bundle-branch block: Secondary | ICD-10-CM | POA: Diagnosis not present

## 2019-10-23 DIAGNOSIS — E161 Other hypoglycemia: Secondary | ICD-10-CM | POA: Diagnosis not present

## 2019-10-23 DIAGNOSIS — R404 Transient alteration of awareness: Secondary | ICD-10-CM | POA: Diagnosis not present

## 2019-10-23 DIAGNOSIS — E162 Hypoglycemia, unspecified: Secondary | ICD-10-CM | POA: Diagnosis not present

## 2019-10-23 NOTE — Telephone Encounter (Signed)
-----   Message from Levin Erp, [place] sent at 10/20/2019 11:26 AM EST ----- Patient does have ESRD on HD- so crea not worrying, though can you please make sure that he has plans to go to hemodialysis today, I think he told me he does.  Thanks-JLL

## 2019-10-23 NOTE — Telephone Encounter (Signed)
Called patient and was able to leave a voice mail. Asked him to please call me back

## 2019-10-24 DIAGNOSIS — N186 End stage renal disease: Secondary | ICD-10-CM | POA: Diagnosis not present

## 2019-10-24 DIAGNOSIS — Z992 Dependence on renal dialysis: Secondary | ICD-10-CM | POA: Diagnosis not present

## 2019-10-24 DIAGNOSIS — N2581 Secondary hyperparathyroidism of renal origin: Secondary | ICD-10-CM | POA: Diagnosis not present

## 2019-10-26 DIAGNOSIS — Z992 Dependence on renal dialysis: Secondary | ICD-10-CM | POA: Diagnosis not present

## 2019-10-26 DIAGNOSIS — N186 End stage renal disease: Secondary | ICD-10-CM | POA: Diagnosis not present

## 2019-10-26 DIAGNOSIS — N2581 Secondary hyperparathyroidism of renal origin: Secondary | ICD-10-CM | POA: Diagnosis not present

## 2019-10-27 ENCOUNTER — Telehealth: Payer: Self-pay

## 2019-10-27 ENCOUNTER — Other Ambulatory Visit: Payer: Medicare Other

## 2019-10-27 DIAGNOSIS — R197 Diarrhea, unspecified: Secondary | ICD-10-CM

## 2019-10-27 DIAGNOSIS — K6289 Other specified diseases of anus and rectum: Secondary | ICD-10-CM

## 2019-10-27 NOTE — Telephone Encounter (Signed)
Patient called, because he saw our phone # on his phone for a  Missed call. I gave him his lab results and he confirmed he is getting hemodialysis 3 times a week

## 2019-10-28 DIAGNOSIS — Z992 Dependence on renal dialysis: Secondary | ICD-10-CM | POA: Diagnosis not present

## 2019-10-28 DIAGNOSIS — N186 End stage renal disease: Secondary | ICD-10-CM | POA: Diagnosis not present

## 2019-10-28 DIAGNOSIS — N2581 Secondary hyperparathyroidism of renal origin: Secondary | ICD-10-CM | POA: Diagnosis not present

## 2019-10-29 LAB — CLOSTRIDIUM DIFFICILE EIA: C difficile Toxins A+B, EIA: NEGATIVE

## 2019-10-30 LAB — GASTROINTESTINAL PATHOGEN PANEL PCR

## 2019-10-31 DIAGNOSIS — N2581 Secondary hyperparathyroidism of renal origin: Secondary | ICD-10-CM | POA: Diagnosis not present

## 2019-10-31 DIAGNOSIS — N186 End stage renal disease: Secondary | ICD-10-CM | POA: Diagnosis not present

## 2019-10-31 DIAGNOSIS — Z992 Dependence on renal dialysis: Secondary | ICD-10-CM | POA: Diagnosis not present

## 2019-11-01 ENCOUNTER — Ambulatory Visit: Payer: Medicare Other | Admitting: Physician Assistant

## 2019-11-01 ENCOUNTER — Telehealth: Payer: Self-pay | Admitting: Cardiovascular Disease

## 2019-11-01 ENCOUNTER — Telehealth: Payer: Self-pay

## 2019-11-01 NOTE — Telephone Encounter (Signed)
-----   Message from Levin Erp, [place] sent at 11/01/2019  8:30 AM EST ----- Needs colonoscopy with Dr. Carlean Purl, he is on Coumadin which needs to be held 5-7 days, please clear with prescribing physician. He is also on dialysis so this will need to be arranged around that. I believe it can be done in the [place].   Let him know stool studies were negative.  Thanks-JLL

## 2019-11-01 NOTE — Telephone Encounter (Signed)
This patient has prior history of discitis and evidence of aortic valve endocarditis in 2016 at [facility]. He was treated with antibiotics but did not tolerate cardiac cath and did not have surgery as recommended. He also did not follow up with [facility] as an outpatient.  He was seen for the first time by Dr. Angelena Form in 02/2018 for evaluation and Echocardiogram showed severe LVH, LVEF 98%, grade 3 diastolic dysfunction, mild AI.   He was noted to be on coumadin for hx of DVT, however, he was seen in the afib clinic on 02/28/2018 and noted to have intermittent atrial flutter.   I believe that [name] needs to be seen in the office for further comment on his valve disease. (a day when Dr. Angelena Form is in the office).   His coumadin is not managed in our office, but I will route to our pharmacist for any input that our office may have.

## 2019-11-01 NOTE — Telephone Encounter (Signed)
Called patient and gave stool study results. States his Dialysis is on Tues.-Thurs.-Sat. I called his Cardiologist office and they are to call back to let us know if his aortic stenosis is sever and what his EF is since he has CHF. This is to know if we can schedule patient at the [facility] or at the hospital

## 2019-11-01 NOTE — Telephone Encounter (Signed)
 [facility] Pre-operative Risk Assessment     Request for surgical clearance:     Endoscopy Procedure  What type of surgery is being performed?     Colonoscopy  When is this surgery scheduled?     11/22/19  What type of clearance is required ?   Pharmacy  Are there any medications that need to be held prior to surgery and how long? Coumadin - for 5-7 days  Practice name and name of physician performing surgery?      [facility] - Dr. Carlean Purl  What is your office phone and fax number?      Phone- [phone]  [phone]  Anesthesia type (None, local, MAC, general) ?       MAC

## 2019-11-01 NOTE — Telephone Encounter (Signed)
Tried to reach pt to advise of needing an appt with APP same day Dr. Angelena Form is in the office per pre op team, though his vm is full could not lmom.

## 2019-11-01 NOTE — Telephone Encounter (Signed)
Patient with diagnosis of aflutter/DVT on warfarin for anticoagulation.    Procedure: Colonoscopy Date of procedure: 11/22/2019  CHADS2-VASc score of  3 (CHF, HTN, DM2 )  Patient does have a history of a single DVT in 2016 and has been on warfarin since. Until his aflutter was discovered recently, I do not see any indication for long term anticoagulation.   He has a pork allergy, therefore cannot use lovenox and would make bridging challenging.   Patient may hold warfarin for 5 days prior to procedure.  I will defer bridging decision to Dr. Angelena Form.

## 2019-11-01 NOTE — Telephone Encounter (Signed)
New Message  Sherlynn Stalls, MD [facility] Lewandowsky Nurse, is calling in to get information about patient. Patient is supposed to be having a colonoscopy and wants to speak with Dr. Angelena Form or his nurse about the nature of his heart health so that they can determine whether the colonoscopy can be done in their office or if he has to be sent to another office. Dr. Koren Bound needs to know what the Ejection Fraction is as well as the nature of his aortic stenosis. Please give nurse Sherlynn Stalls a call back to discuss at [phone].

## 2019-11-01 NOTE — Telephone Encounter (Signed)
I left message for [name] [name] returning her call, see notes from the call. I left message in regards to the colonoscopy I will need her to please fax over a cardiac clearance form to [phone].  I left message in regards to the information being requested such as Dr. Tina Griffiths is needs to know what the Ejection Fraction is as well as the nature of his aortic stenosis. Please give nurse [name] [name] a call back to discuss at [phone]  I will send this message to our HIM Dept to help with the information being requested.

## 2019-11-02 DIAGNOSIS — N2581 Secondary hyperparathyroidism of renal origin: Secondary | ICD-10-CM | POA: Diagnosis not present

## 2019-11-02 DIAGNOSIS — N186 End stage renal disease: Secondary | ICD-10-CM | POA: Diagnosis not present

## 2019-11-02 DIAGNOSIS — Z992 Dependence on renal dialysis: Secondary | ICD-10-CM | POA: Diagnosis not present

## 2019-11-03 NOTE — Telephone Encounter (Signed)
Pt has been scheduled to see Richardson Dopp, [facility] 11/10/19 @ 11:45 for pre op clearance. Pt aware to wear his mask for the entire visit. Pt agreeable to appt and thanked me for the call. I will forward clearance notes to Richardson Dopp, [facility] for upcoming appt as well as FYI to the surgeon in regards to appt 11/10/19.

## 2019-11-03 NOTE — Telephone Encounter (Signed)
It looks like he is on coumadin for DVT. I have only seen him a few times but have never been involved with his DVT. When you see him next week [name], can you try and figure out the nature of his DVT and why he is still on coumadin?   [name]

## 2019-11-04 DIAGNOSIS — N186 End stage renal disease: Secondary | ICD-10-CM | POA: Diagnosis not present

## 2019-11-04 DIAGNOSIS — N2581 Secondary hyperparathyroidism of renal origin: Secondary | ICD-10-CM | POA: Diagnosis not present

## 2019-11-04 DIAGNOSIS — Z992 Dependence on renal dialysis: Secondary | ICD-10-CM | POA: Diagnosis not present

## 2019-11-06 ENCOUNTER — Telehealth: Payer: Self-pay

## 2019-11-06 NOTE — Telephone Encounter (Signed)
-----   Message from Levonne Spiller, RN sent at 11/06/2019 10:52 AM EST ----- Yes that is true, but it says english for his language. I just wondered why the PA thought he would need an interpreter if he does not. Also he is scheduled for a 30 minute PV and with any language barrier it takes a lot longer than 30 minutes. So I feel he may at least need 60 minutes for PV to make sure he understands his prep instructions. So please reschedule this for me. Thanks!  ----- Message ----- From: Hughie Closs, RN Sent: 11/06/2019  10:32 AM EST To: Levonne Spiller, RN  Hi Robin, I have spoke to this patient multiple times on the phone without an interpreter. He has a strong accent, but even over the phone I have been able to understand him. If he needs an interpreter and it is on the chart, one is scheduled automatically with any appt. (isn't it). Thanks Sherlynn Stalls ----- Message ----- From: Levonne Spiller, RN Sent: 11/06/2019  10:17 AM EST To: Hughie Closs, RN  Hey per Jennifer's office visit on 10/20/2019 she states the patient should have an interpreter. I'm not sure what language. Will you please look into this and get this schedule before Pre-visit ? Thanks, [employer]

## 2019-11-06 NOTE — Telephone Encounter (Signed)
Changed patient's Pre-visit to 12:45pm on 11/13/19  because of needing a 60 min. time slot to go over the instructions. Patient has a strong accent that may take a little longer to communicate through.

## 2019-11-07 DIAGNOSIS — Z992 Dependence on renal dialysis: Secondary | ICD-10-CM | POA: Diagnosis not present

## 2019-11-07 DIAGNOSIS — N2581 Secondary hyperparathyroidism of renal origin: Secondary | ICD-10-CM | POA: Diagnosis not present

## 2019-11-07 DIAGNOSIS — N186 End stage renal disease: Secondary | ICD-10-CM | POA: Diagnosis not present

## 2019-11-08 ENCOUNTER — Telehealth: Payer: Self-pay | Admitting: Cardiovascular Disease

## 2019-11-08 DIAGNOSIS — I351 Nonrheumatic aortic (valve) insufficiency: Secondary | ICD-10-CM

## 2019-11-08 NOTE — Telephone Encounter (Signed)
Patient calling to request he have an echo. He states he is on dialysis and he wants to make sure everything is okay with his heart.

## 2019-11-08 NOTE — Telephone Encounter (Signed)
Patient last seen by Dr. Angelena Form 02/28/2018. Echo done at that time.  Recommendations were for follow up echo and return visit in one year. Message to scheduling who called patient but the patient is requesting an echo prior to being seen.  Adv pt I will send a message to Dr. Angelena Form to see if he wants to order echo now and then patient will be scheduled for follow up in April.  He states he is not having any cardiac issues at this time.

## 2019-11-08 NOTE — Telephone Encounter (Signed)
Thanks for your help  Looks like DVT around 2016-17 and I do not think any since  Appreciate your help  I know that in patients like this what I usually see is hold the warfarin x 5 days  Glendell Docker

## 2019-11-08 NOTE — Telephone Encounter (Signed)
Yes. I had planned an echo for May 2020 but I think Covid cancelled that. He is due for an echo and f/u in the office. Thanks, chris

## 2019-11-09 DIAGNOSIS — N2581 Secondary hyperparathyroidism of renal origin: Secondary | ICD-10-CM | POA: Diagnosis not present

## 2019-11-09 DIAGNOSIS — N186 End stage renal disease: Secondary | ICD-10-CM | POA: Diagnosis not present

## 2019-11-09 DIAGNOSIS — Z992 Dependence on renal dialysis: Secondary | ICD-10-CM | POA: Diagnosis not present

## 2019-11-09 NOTE — Telephone Encounter (Signed)
FYI He canceled his appt with me tomorrow [name] [name]

## 2019-11-10 ENCOUNTER — Ambulatory Visit: Payer: Medicare HMO | Admitting: Physician Assistant

## 2019-11-10 DIAGNOSIS — H18423 Band keratopathy, bilateral: Secondary | ICD-10-CM | POA: Insufficient documentation

## 2019-11-10 DIAGNOSIS — E113553 Type 2 diabetes mellitus with stable proliferative diabetic retinopathy, bilateral: Secondary | ICD-10-CM | POA: Diagnosis not present

## 2019-11-10 DIAGNOSIS — Z961 Presence of intraocular lens: Secondary | ICD-10-CM | POA: Insufficient documentation

## 2019-11-10 NOTE — Telephone Encounter (Signed)
I spoke with the patient who stated he "didn't understand" the need for his cardiology appt which is why he cancelled it. I told the patient that he would not be allowed to have his colonoscopy without the clearance due to his anticoag admin. I explained the clearance required by his cardiologist and that he needed to call his cardiologist office and reschedule his appointment if he wanted his colonoscopy. All subsequent appts related to his colonoscopy have been rescheduled for the following dates and times:   -12/11/19 at 10 am previsit with nurse -12/21/19 at 10 am COVID screening -3/15 at 10 am Colon in [place]  I will continue to monitor his chart to ensure he has made his appt with cardiology and move [place] related appts as necessary.

## 2019-11-10 NOTE — Telephone Encounter (Signed)
Patient rescheduled with Dr. Kathlen Mody on 11/15/19 at 10:45 am.

## 2019-11-10 NOTE — Telephone Encounter (Signed)
Thanks for the update [name]  We will try to contact him and regroup.  Brittani - this man saw Anderson Malta and is to have a colonoscopy w/ me but we were asking for cardiology clearance to hold warfarin.  Please contact him and see why he cancelled and explain necessity of a cardiology appointment in context of colonoscopy, and work on rescheduling.  Thanks

## 2019-11-11 DIAGNOSIS — Z992 Dependence on renal dialysis: Secondary | ICD-10-CM | POA: Diagnosis not present

## 2019-11-11 DIAGNOSIS — N2581 Secondary hyperparathyroidism of renal origin: Secondary | ICD-10-CM | POA: Diagnosis not present

## 2019-11-11 DIAGNOSIS — N186 End stage renal disease: Secondary | ICD-10-CM | POA: Diagnosis not present

## 2019-11-12 DIAGNOSIS — Z992 Dependence on renal dialysis: Secondary | ICD-10-CM | POA: Diagnosis not present

## 2019-11-12 DIAGNOSIS — E1122 Type 2 diabetes mellitus with diabetic chronic kidney disease: Secondary | ICD-10-CM | POA: Diagnosis not present

## 2019-11-12 DIAGNOSIS — N186 End stage renal disease: Secondary | ICD-10-CM | POA: Diagnosis not present

## 2019-11-13 ENCOUNTER — Encounter: Payer: Self-pay | Admitting: *Deleted

## 2019-11-13 ENCOUNTER — Telehealth: Payer: Self-pay | Admitting: Internal Medicine

## 2019-11-13 NOTE — Telephone Encounter (Signed)
Spoke with patient and caregiver, patient rescheduled for the following:  12/08/19 at 1:00 pm previsit with nurse  12/20/19 at 12:30 pm COVID screening  12/22/19 at 1:30 pm colonoscopy (60 minutes)  Will await cardiac clearance.   Letter sent to patient.

## 2019-11-13 NOTE — Telephone Encounter (Signed)
Pt requested to know why his colonoscopy had been rescheduled.  He stated that he was not informed of the new dates.

## 2019-11-14 DIAGNOSIS — Z961 Presence of intraocular lens: Secondary | ICD-10-CM | POA: Diagnosis not present

## 2019-11-14 DIAGNOSIS — E113553 Type 2 diabetes mellitus with stable proliferative diabetic retinopathy, bilateral: Secondary | ICD-10-CM | POA: Diagnosis not present

## 2019-11-14 DIAGNOSIS — Z794 Long term (current) use of insulin: Secondary | ICD-10-CM | POA: Diagnosis not present

## 2019-11-14 DIAGNOSIS — N186 End stage renal disease: Secondary | ICD-10-CM | POA: Diagnosis not present

## 2019-11-14 DIAGNOSIS — N2581 Secondary hyperparathyroidism of renal origin: Secondary | ICD-10-CM | POA: Diagnosis not present

## 2019-11-14 DIAGNOSIS — Z992 Dependence on renal dialysis: Secondary | ICD-10-CM | POA: Diagnosis not present

## 2019-11-14 DIAGNOSIS — H04123 Dry eye syndrome of bilateral lacrimal glands: Secondary | ICD-10-CM | POA: Diagnosis not present

## 2019-11-14 LAB — HM DIABETES EYE EXAM

## 2019-11-14 NOTE — Progress Notes (Signed)
Cardiology Office Note:    Date:  11/15/2019   ID:  [name] [name], DOB 06/13/1966, MRN [id number]  PCP:  [name] Koch, MD  Cardiologist:  Lauree Chandler, MD  Electrophysiologist:  None  Nephrologist: Dr. Lorrene Reid  Referring MD: [name] [name], *   Chief Complaint:  Surgical Clearance (Needs Colonoscopy)    Patient Profile:    [name] [name] is a 54 y.o. male with:   Diabetes mellitus, Insulin Dependent   ESRD s/p Renal Tx in 2009 ([facility])  On hemodialysis   Chronic Diastolic CHF  Atrial tachycardia  Atrial flutter   Seen at [facility] in 10/2018; AFlutter noted post dialysis; no ? in Rx; consider [place] if symptomatic  CHA2DS2-VASc=4 (diab, HTN, CHF, PVD)  Coumadin Rx   Hypertension   Hyperlipidemia   Sleep Apnea  Obesity   Aortic Insufficiency secondary to Aortic valve endocarditis 2016  Admx w/ discitis vs osteomyelitis >> tx to [facility]; Bld Cx + Enterococcus  AVR was recommended >> patient never had surgery/preferred to avoid surgery  Echo 7/16: EF 50-55, mild to mod AI, PASP 57  Echo 2/17: mod LVH, EF 50-55, mod AI, mild MR, PASP 63  Echo 3/18: mod LVH, EF 60-65, mod AI, PASP 39  Echo 5/19: mild AI, EF 60-65  Peripheral Arterial Disease    eval by Dr. Gwenlyn Found in 10/2017 >> L foot wound not felt to be ischemic; no further eval needed  Hx of DVT 09/2015  Warfarin started during admission   Prior CV studies: Echocardiogram 02/23/18 Severe LVH, EF 60-65, no RWMA, Gr 3 DD, mild AI  ABIs/LE arterial US 10/22/17 Right: Arterial obstruction involving the peroneal artery.  Left: Arterial obstruction involving the posterior tibial artery.   TEE 11/2014 at [place], severe AI, normal LVF  Echo 08/11/12 Mod LVH, EF 55, Gr 1 DD, mod MAC, mild LAE  Myoview 06/2010 Low risk, no ischemia, EF 52% Decreased activity in anterior base/mid-wall-may represent some scar   History of Present Illness:    [name] was last seen in clinic by  Dr. Angelena Form in 02/2018.  He was seen in the AFib clinic in 10/2018.  He was found to be in AFlutter after dialysis.  The patient was asymptomatic and no change in Rx was recommended.    The patient needs a colonoscopy.  He was scheduled for follow up prior to his procedure.  He is here alone today.  He arrives in a power chair.  He has not had any symptoms of chest discomfort or significant shortness of breath.  He does have more shortness of breath prior to dialysis sessions.  He has not had syncope, orthopnea or significant leg swelling.  He has been getting to his dry weight.  He goes to dialysis Tuesday, Thursday, Saturday.  He does have poor balance and uses a motorized chair but is able to get around and do some housework at home.  Past Medical History:  Diagnosis Date  . Allergic rhinitis   . Anemia   . Aortic insufficiency    a. Echo 7/16:  Mod LVH, EF 50-55%, mild to mod AI, severe LAE, PASP 57 mmHg (LV ID end diastolic 50.5 mm);  b. Echo 2/17:  Mod LVH, EF 50-55%, mod AI, MAC, mild MR, mod LAE, mild to mod TR, PASP 63 mmHg  . Bilateral carpal tunnel syndrome   . CHF (congestive heart failure) ([place])   . Claustrophobia   . COPD (chronic obstructive pulmonary disease) ([place])   .  Diabetes mellitus    Type II  . Diabetic peripheral neuropathy ([place])   . Diabetic retinopathy ([place])   . Discitis of lumbar region    resolved  . DVT (deep venous thrombosis) ([place]) 10/07/2015   LLE  . Endocarditis 11/28/14  . ESRD (end stage renal disease) on dialysis [facility])    s/p renal transplant in 2009, failed in 2016  . Gait disorder   . Gastroparesis   . GERD (gastroesophageal reflux disease)   . Gout   . Hearing difficulty   . Hyperlipidemia   . Hypertension   . Incomplete bladder emptying   . Leg pain 02/05/11   with walking  . Morbid obesity ([place])   . Obstructive sleep apnea    not using CPAP- lost 100lbs  . Osteomyelitis ([place])   . Posttraumatic stress disorder   . Rotator cuff disorder   .  Secondary hyperparathyroidism ([place])   . Sepsis ([place])    Postive blood cultures -   . Urethral stricture   . Vitamin D deficiency     Current Medications: Current Meds  Medication Sig  . Accu-Chek FastClix Lancets MISC USE TO CHECK BLOOD SUGARS 4 TIMES A DAY.  Marland Kitchen allopurinol (ZYLOPRIM) 100 MG tablet Take 1 tablet (100 mg total) by mouth 3 (three) times a week. After dialysis  . calcium acetate (PHOSLO) 667 MG capsule Take 3 capsules (2,001 mg total) by mouth 3 (three) times daily with meals.  . DROPLET PEN NEEDLES 32G X 4 MM MISC USE  TO INJECT FIVE TIMES DAILY AS DIRECTED  . glucose blood (ACCU-CHEK GUIDE) test strip 1 each by Other route as needed for other. Use as instructed to check blood sugar 4 times daily. DX:E11.65  . heparin 1000 UNIT/ML injection Heparin Sodium (Porcine) 1,000 Units/mL Catheter Lock Venous  . hydrocortisone 1 % ointment Apply 1 application topically 2 (two) times daily.  . hydrocortisone-pramoxine (PROCTOFOAM-HC) rectal foam Place 1 applicator rectally 2 (two) times daily.  . hydrOXYzine (ATARAX/VISTARIL) 25 MG tablet Take 1 tablet (25 mg total) by mouth 3 (three) times daily as needed for anxiety.  . Insulin Glargine, 2 Unit Dial, (TOUJEO MAX SOLOSTAR) 300 UNIT/ML SOPN Inject 70-80 Units into the skin 2 (two) times daily. Inject 80 units under the skin in the morning and 70 units in the evening.  . Insulin Pen Needle 32G X 8 MM MISC Use with insulin x5 times a day  . insulin regular human CONCENTRATED (HUMULIN R U-500 KWIKPEN) 500 UNIT/ML kwikpen Inject 40 units for breakfast 70 at lunch and 60 at supper-inject 30 minutes before each meal  . loperamide (IMODIUM) 2 MG capsule Take 1 capsule (2 mg total) by mouth as needed for diarrhea or loose stools.  . midodrine (PROAMATINE) 10 MG tablet Take 10 mg by mouth as needed (low blood pressure).   . mupirocin ointment (BACTROBAN) 2 % Apply 1 application topically 2 (two) times daily.  Marland Kitchen omeprazole (PRILOSEC) 20 MG capsule  TAKE 1 CAPSULE EVERY DAY  . oxyCODONE-acetaminophen (PERCOCET/ROXICET) 5-325 MG tablet Take 1 tablet by mouth every 6 (six) hours as needed.  Marland Kitchen OZEMPIC, 0.25 OR 0.5 MG/DOSE, 2 MG/1.5ML SOPN   . RENVELA 800 MG tablet Take 2,400 mg by mouth 3 (three) times daily with meals.   . rosuvastatin (CRESTOR) 20 MG tablet TAKE 1 TABLET AT BEDTIME  . VELTASSA 8.4 g packet Take 8.4 g by mouth daily.   Marland Kitchen warfarin (COUMADIN) 5 MG tablet Take 1 (5 mg) tablet every day  except for Wednesday & Saturday when you take 7.31m (1.5 tablets) OR As directed.     Allergies:   Pork-derived products, Ace inhibitors, Hydrocodone, and Pork allergy   Social History   Tobacco Use  . Smoking status: Never Smoker  . Smokeless tobacco: Never Used  Substance Use Topics  . Alcohol use: No    Alcohol/week: 0.0 standard drinks  . Drug use: No     Family Hx: The patient's family history includes Diabetes in his father; Hypertension in his mother.  Review of Systems  Gastrointestinal: Negative for hematochezia and melena.     EKGs/Labs/Other Test Reviewed:    EKG:  EKG is  ordered today.  The ekg ordered today demonstrates atrial flutter, 2-1 conduction, HR 96, right bundle branch block, left axis deviation, IVCD, QTC 548  Recent Labs: 10/20/2019: ALT 11; BUN 69; Creatinine, Ser 14.58; Hemoglobin 11.5; Platelets 182.0; Potassium 5.3; Sodium 136   Recent Lipid Panel Lab Results  Component Value Date/Time   CHOL 93 07/21/2019 11:21 AM   TRIG 146.0 07/21/2019 11:21 AM   HDL 28.00 (L) 07/21/2019 11:21 AM   CHOLHDL 3 07/21/2019 11:21 AM   LDLCALC 36 07/21/2019 11:21 AM     Physical Exam:    VS:  BP 130/60   Pulse 96   Ht _0  (1.778 m)   Wt 286 lb (129.7 kg) Comment: patient unable to stand toget weight said was same as last w  SpO2 97%   BMI 41.04 kg/m     Wt Readings from Last 3 Encounters:  11/15/19 286 lb (129.7 kg)  10/20/19 286 lb 9.6 oz (130 kg)  10/09/19 290 lb (131.5 kg)     Constitutional:       Appearance: Healthy appearance. Not in distress.  Pulmonary:     Effort: Pulmonary effort is normal.     Breath sounds: No wheezing. No rales.  Cardiovascular:     Normal rate. Regular rhythm. Normal S1. Normal S2.     Murmurs: There is no murmur.  Edema:    Pretibial: bilateral trace edema of the pretibial area. Abdominal:     Palpations: Abdomen is soft. There is no hepatomegaly.  Musculoskeletal:     Cervical back: Neck supple. Skin:    General: Skin is warm and dry.  Neurological:     General: No focal deficit present.     Mental Status: Alert and oriented to person, place and time.     Cranial Nerves: Cranial nerves are intact.      ASSESSMENT & PLAN:    1. Preoperative cardiovascular examination His risk of perioperative major cardiac event is elevated at 11% according to the revised cardiac risk index.  However, he is able to achieve 4 METS or greater.  Therefore, he does not require any further ischemic testing prior to his upcoming colonoscopy.  However, it has been more than a year since he last had an echocardiogram.  Given his valvular heart disease, he does need a follow-up echocardiogram prior to providing surgical risk recommendations.  This is scheduled for Monday November 20, 2019.  Final recommendations can be made after his echocardiogram is reviewed.    2. Nonrheumatic aortic valve insufficiency 3. History of bacterial endocarditis History of prior endocarditis with moderate to severe aortic insufficiency.  He never underwent surgery and his valve regurgitation did improve over time.  Most recently, his aortic regurgitation was mild.  His last echo was in May 2019.  Follow-up echo has already been scheduled  for February 8.  Continue SBE prophylaxis.  4. Atrial flutter, unspecified type [facility]) He has paroxysmal atrial flutter.  He is asymptomatic.  He is chronically anticoagulated with Warfarin.  He was initially placed on warfarin for DVT.  He has not had a  recurrence.  CHA2DS2-VASc=4.  He will remain on long-term anticoagulation with Warfarin.  This is managed by primary care.  He does have a pork allergy and is unable to take Lovenox.  He notes that he has held warfarin in the past without difficulty.  I did review his case with our clinical pharmacist in the Coumadin clinic regarding perioperative management of his Coumadin.  Since his CHA2DS2-VASc is < 6 and he has never had a stroke, he can hold Warfarin x 5 days prior to his procedure without bridging therapy.  His Warfarin should be resumed as soon as it is safe post op.    5. Chronic diastolic CHF (congestive heart failure) ([place]) Volume managed by dialysis.  6. ESRD on dialysis [facility]) He goes to dialysis Tuesday, Thursday, Saturday.  7. Essential hypertension Blood pressure runs low now on dialysis.  He is no longer on medical therapy.   Dispo:  Return in about 1 year (around 11/14/2020) for Routine Follow Up w/ Dr. Angelena Form, in person.   Medication Adjustments/Labs and Tests Ordered: Current medicines are reviewed at length with the patient today.  Concerns regarding medicines are outlined above.  Tests Ordered: Orders Placed This Encounter  Procedures  . EKG 12-Lead   Medication Changes: No orders of the defined types were placed in this encounter.   Signed, Richardson Dopp, PA-C  11/15/2019 [facility] [place], [place], [place]  [id number] Phone: [phone]; Fax: [phone]

## 2019-11-14 NOTE — Telephone Encounter (Signed)
Echo is scheduled 11/20/19. Also scheduled with APP 11/15/19.

## 2019-11-15 ENCOUNTER — Other Ambulatory Visit: Payer: Self-pay

## 2019-11-15 ENCOUNTER — Encounter: Payer: Self-pay | Admitting: Physician Assistant

## 2019-11-15 ENCOUNTER — Ambulatory Visit (INDEPENDENT_AMBULATORY_CARE_PROVIDER_SITE_OTHER): Payer: Medicare HMO | Admitting: Physician Assistant

## 2019-11-15 VITALS — BP 130/60 | HR 96 | Ht 70.0 in | Wt 286.0 lb

## 2019-11-15 DIAGNOSIS — N186 End stage renal disease: Secondary | ICD-10-CM | POA: Diagnosis not present

## 2019-11-15 DIAGNOSIS — I4892 Unspecified atrial flutter: Secondary | ICD-10-CM | POA: Diagnosis not present

## 2019-11-15 DIAGNOSIS — Z01810 Encounter for preprocedural cardiovascular examination: Secondary | ICD-10-CM | POA: Diagnosis not present

## 2019-11-15 DIAGNOSIS — I5032 Chronic diastolic (congestive) heart failure: Secondary | ICD-10-CM | POA: Diagnosis not present

## 2019-11-15 DIAGNOSIS — Z8679 Personal history of other diseases of the circulatory system: Secondary | ICD-10-CM

## 2019-11-15 DIAGNOSIS — I10 Essential (primary) hypertension: Secondary | ICD-10-CM | POA: Diagnosis not present

## 2019-11-15 DIAGNOSIS — I351 Nonrheumatic aortic (valve) insufficiency: Secondary | ICD-10-CM | POA: Diagnosis not present

## 2019-11-15 DIAGNOSIS — Z992 Dependence on renal dialysis: Secondary | ICD-10-CM | POA: Diagnosis not present

## 2019-11-15 NOTE — Telephone Encounter (Signed)
See my OV note from today.  His echocardiogram is scheduled for Monday. He can hold Coumadin for 5 days prior to his procedure and resume as soon as it is safe. Thanks,  Richardson Dopp, PA-C    11/15/2019 4:57 PM

## 2019-11-15 NOTE — Patient Instructions (Signed)
Medication Instructions:   Your physician recommends that you continue on your current medications as directed. Please refer to the Current Medication list given to you today.  *If you need a refill on your cardiac medications before your next appointment, please call your pharmacy*  Lab Work: [place]   If you have labs (blood work) drawn today and your tests are completely normal, you will receive your results only by: Marland Kitchen MyChart Message (if you have MyChart) OR . A paper copy in the mail If you have any lab test that is abnormal or we need to change your treatment, we will call you to review the results.  Testing/Procedures: NONE ORDERED  TODAY   Follow-Up: At [facility], you and your health needs are our priority.  As part of our continuing mission to provide you with exceptional heart care, we have created designated Provider Care Teams.  These Care Teams include your primary Cardiologist (physician) and Advanced Practice Providers (APPs -  Physician Assistants and Nurse Practitioners) who all work together to provide you with the care you need, when you need it.  Your next appointment:   1 year(s)  The format for your next appointment:   In Person  Provider:   You may see Lauree Chandler, MD or one of the following Advanced Practice Providers on your designated Care Team:    Melina Copa, PA-C  Ermalinda Barrios, PA-C   Other Instructions

## 2019-11-16 ENCOUNTER — Telehealth: Payer: Self-pay

## 2019-11-16 DIAGNOSIS — N2581 Secondary hyperparathyroidism of renal origin: Secondary | ICD-10-CM | POA: Diagnosis not present

## 2019-11-16 DIAGNOSIS — N186 End stage renal disease: Secondary | ICD-10-CM | POA: Diagnosis not present

## 2019-11-16 DIAGNOSIS — Z992 Dependence on renal dialysis: Secondary | ICD-10-CM | POA: Diagnosis not present

## 2019-11-16 NOTE — Addendum Note (Signed)
Addended byKathlen Mody, [name] T on: 11/16/2019 07:32 PM   Modules accepted: Level of Service

## 2019-11-16 NOTE — Telephone Encounter (Signed)
Clearance to Hold Coumadin for 12/22/19 procedure is in [place] office note on 11/15/19, #4 under Assessment & Plan

## 2019-11-16 NOTE — Telephone Encounter (Signed)
See clearance below to Hold Coumadin 5 days for prior to Colonoscopy on 12/22/19

## 2019-11-17 ENCOUNTER — Telehealth: Payer: Self-pay | Admitting: Cardiovascular Disease

## 2019-11-17 NOTE — Telephone Encounter (Signed)
Last call placed was from Echo department. I spoke with Abe People and this was a reminder call.  I spoke with patient and told him call was regarding echo on Monday.  Patient aware to arrive at 7 AM and wear a mask.

## 2019-11-17 NOTE — Telephone Encounter (Signed)
New Message  Patient missed a call from our office. States he is unsure who it was. Patient returning call. Please give patient a call back.

## 2019-11-17 NOTE — Telephone Encounter (Signed)
Thanks

## 2019-11-18 DIAGNOSIS — N186 End stage renal disease: Secondary | ICD-10-CM | POA: Diagnosis not present

## 2019-11-18 DIAGNOSIS — Z992 Dependence on renal dialysis: Secondary | ICD-10-CM | POA: Diagnosis not present

## 2019-11-18 DIAGNOSIS — N2581 Secondary hyperparathyroidism of renal origin: Secondary | ICD-10-CM | POA: Diagnosis not present

## 2019-11-20 ENCOUNTER — Other Ambulatory Visit (HOSPITAL_COMMUNITY): Payer: Medicare HMO

## 2019-11-20 ENCOUNTER — Ambulatory Visit (HOSPITAL_COMMUNITY): Payer: Medicare HMO | Attending: Cardiovascular Disease

## 2019-11-20 ENCOUNTER — Other Ambulatory Visit: Payer: Self-pay

## 2019-11-20 DIAGNOSIS — I351 Nonrheumatic aortic (valve) insufficiency: Secondary | ICD-10-CM

## 2019-11-21 DIAGNOSIS — Z992 Dependence on renal dialysis: Secondary | ICD-10-CM | POA: Diagnosis not present

## 2019-11-21 DIAGNOSIS — N186 End stage renal disease: Secondary | ICD-10-CM | POA: Diagnosis not present

## 2019-11-21 DIAGNOSIS — N2581 Secondary hyperparathyroidism of renal origin: Secondary | ICD-10-CM | POA: Diagnosis not present

## 2019-11-22 ENCOUNTER — Encounter: Payer: Medicare HMO | Admitting: Internal Medicine

## 2019-11-23 ENCOUNTER — Telehealth: Payer: Self-pay | Admitting: Internal Medicine

## 2019-11-23 DIAGNOSIS — R2681 Unsteadiness on feet: Secondary | ICD-10-CM

## 2019-11-23 DIAGNOSIS — N186 End stage renal disease: Secondary | ICD-10-CM | POA: Diagnosis not present

## 2019-11-23 DIAGNOSIS — Z992 Dependence on renal dialysis: Secondary | ICD-10-CM | POA: Diagnosis not present

## 2019-11-23 DIAGNOSIS — N2581 Secondary hyperparathyroidism of renal origin: Secondary | ICD-10-CM | POA: Diagnosis not present

## 2019-11-23 NOTE — Telephone Encounter (Signed)
Patient states his power chair messed up and he needs a new one. Patient spoke with insurance company, [employer]. They informed him he needs to have the doctor's office call to give authorization and they can get him a new one. Patient needs he really needs this ASAP he is unable to get around.   Authorization number to call : [id number]

## 2019-11-23 NOTE — Telephone Encounter (Signed)
Called pt, LVM.   

## 2019-11-23 NOTE — Telephone Encounter (Signed)
Fine to do 

## 2019-11-23 NOTE — Telephone Encounter (Signed)
Placed order for PT evaluation for scooter. This typically can take weeks to months. I would strongly recommend patient call [employer] to see if they can come to his house and assess scooter to see if this can be repaired temporarily until we can get a new scooter. He also needs to be the one to initiate getting a scooter with the home health company of his choosing (typically we use [employer] more often). With his medicare this process is typically months. He will need to complete the PT evaluation, then have a face to face visit with me in office within 30 days of PT assessment. Then paperwork can be filled out for the home health company who handles the process with insurance company.

## 2019-11-23 NOTE — Telephone Encounter (Addendum)
Pt stated that he has had the chair for over 6 years and does not know who to get in contact with because he has no information about the company that he went through. I explained to him that more than likely a new evaluation would be needed if its been over 6 years. He expressed understanding.

## 2019-11-23 NOTE — Addendum Note (Signed)
Addended by: [name] A on: 11/23/2019 12:17 PM   Modules accepted: Orders

## 2019-11-24 NOTE — Telephone Encounter (Signed)
° ° °  Please return call to patient °

## 2019-11-24 NOTE — Telephone Encounter (Signed)
Patient called back. I have explained the notes below to him. He stated he understood. He just needs this done ASAP to get a chair so he can get around.

## 2019-11-25 DIAGNOSIS — N186 End stage renal disease: Secondary | ICD-10-CM | POA: Diagnosis not present

## 2019-11-25 DIAGNOSIS — N2581 Secondary hyperparathyroidism of renal origin: Secondary | ICD-10-CM | POA: Diagnosis not present

## 2019-11-25 DIAGNOSIS — Z992 Dependence on renal dialysis: Secondary | ICD-10-CM | POA: Diagnosis not present

## 2019-11-27 NOTE — Telephone Encounter (Signed)
° ° °  Patient calling, states he spoke with [facility] and they are requesting a referral be faxed to their office at [phone]

## 2019-11-27 NOTE — Telephone Encounter (Addendum)
Pt given ph# [phone] for [facility] [employer] in [place] to contact to see if they will come to his home to do the PT assessment.

## 2019-11-28 DIAGNOSIS — N186 End stage renal disease: Secondary | ICD-10-CM | POA: Diagnosis not present

## 2019-11-28 DIAGNOSIS — Z992 Dependence on renal dialysis: Secondary | ICD-10-CM | POA: Diagnosis not present

## 2019-11-28 DIAGNOSIS — N2581 Secondary hyperparathyroidism of renal origin: Secondary | ICD-10-CM | POA: Diagnosis not present

## 2019-11-28 NOTE — Telephone Encounter (Signed)
Called pt, LVM.   

## 2019-11-28 NOTE — Telephone Encounter (Signed)
After speaking with Cecille Rubin, [facility], a referral to [employer] not needed. They would need a DME order. Please advise.

## 2019-11-28 NOTE — Telephone Encounter (Signed)
° °  Patient calling to provide updated fax number for [employer]. Please fax [phone]

## 2019-11-28 NOTE — Telephone Encounter (Signed)
The referral is not for [employer] but for PT to evaluate for need for power wheelchair/scooter. This needs to be done before DME can be ordered since so long since past. I would recommend patient contact [employer] to see if they can fix current chair in meantime since this will likely take several months.

## 2019-11-30 DIAGNOSIS — Z992 Dependence on renal dialysis: Secondary | ICD-10-CM | POA: Diagnosis not present

## 2019-11-30 DIAGNOSIS — N2581 Secondary hyperparathyroidism of renal origin: Secondary | ICD-10-CM | POA: Diagnosis not present

## 2019-11-30 DIAGNOSIS — N186 End stage renal disease: Secondary | ICD-10-CM | POA: Diagnosis not present

## 2019-12-02 DIAGNOSIS — N2581 Secondary hyperparathyroidism of renal origin: Secondary | ICD-10-CM | POA: Diagnosis not present

## 2019-12-02 DIAGNOSIS — Z992 Dependence on renal dialysis: Secondary | ICD-10-CM | POA: Diagnosis not present

## 2019-12-02 DIAGNOSIS — N186 End stage renal disease: Secondary | ICD-10-CM | POA: Diagnosis not present

## 2019-12-05 DIAGNOSIS — Z992 Dependence on renal dialysis: Secondary | ICD-10-CM | POA: Diagnosis not present

## 2019-12-05 DIAGNOSIS — N186 End stage renal disease: Secondary | ICD-10-CM | POA: Diagnosis not present

## 2019-12-05 DIAGNOSIS — N2581 Secondary hyperparathyroidism of renal origin: Secondary | ICD-10-CM | POA: Diagnosis not present

## 2019-12-05 DIAGNOSIS — E1159 Type 2 diabetes mellitus with other circulatory complications: Secondary | ICD-10-CM | POA: Diagnosis not present

## 2019-12-05 DIAGNOSIS — E119 Type 2 diabetes mellitus without complications: Secondary | ICD-10-CM | POA: Diagnosis not present

## 2019-12-07 DIAGNOSIS — N2581 Secondary hyperparathyroidism of renal origin: Secondary | ICD-10-CM | POA: Diagnosis not present

## 2019-12-07 DIAGNOSIS — Z992 Dependence on renal dialysis: Secondary | ICD-10-CM | POA: Diagnosis not present

## 2019-12-07 DIAGNOSIS — N186 End stage renal disease: Secondary | ICD-10-CM | POA: Diagnosis not present

## 2019-12-08 ENCOUNTER — Ambulatory Visit (INDEPENDENT_AMBULATORY_CARE_PROVIDER_SITE_OTHER): Payer: Medicare HMO | Admitting: Internal Medicine

## 2019-12-08 ENCOUNTER — Encounter: Payer: Self-pay | Admitting: Internal Medicine

## 2019-12-08 ENCOUNTER — Other Ambulatory Visit: Payer: Self-pay

## 2019-12-08 ENCOUNTER — Ambulatory Visit (AMBULATORY_SURGERY_CENTER): Payer: Self-pay | Admitting: *Deleted

## 2019-12-08 VITALS — BP 126/82 | HR 87 | Temp 98.0°F | Ht 70.0 in

## 2019-12-08 VITALS — Temp 97.6°F | Ht 70.0 in | Wt 281.3 lb

## 2019-12-08 DIAGNOSIS — Z992 Dependence on renal dialysis: Secondary | ICD-10-CM | POA: Diagnosis not present

## 2019-12-08 DIAGNOSIS — Z794 Long term (current) use of insulin: Secondary | ICD-10-CM | POA: Diagnosis not present

## 2019-12-08 DIAGNOSIS — R197 Diarrhea, unspecified: Secondary | ICD-10-CM

## 2019-12-08 DIAGNOSIS — N186 End stage renal disease: Secondary | ICD-10-CM

## 2019-12-08 DIAGNOSIS — E1122 Type 2 diabetes mellitus with diabetic chronic kidney disease: Secondary | ICD-10-CM | POA: Diagnosis not present

## 2019-12-08 DIAGNOSIS — I509 Heart failure, unspecified: Secondary | ICD-10-CM | POA: Diagnosis not present

## 2019-12-08 DIAGNOSIS — K6289 Other specified diseases of anus and rectum: Secondary | ICD-10-CM

## 2019-12-08 DIAGNOSIS — Z6841 Body Mass Index (BMI) 40.0 and over, adult: Secondary | ICD-10-CM | POA: Diagnosis not present

## 2019-12-08 MED ORDER — SUPREP BOWEL PREP KIT 17.5-3.13-1.6 GM/177ML PO SOLN: 1.0000 | Freq: Once | ORAL | 0 refills | Status: AC

## 2019-12-08 NOTE — Progress Notes (Signed)
   Subjective:   Patient ID: [name] [name], male    DOB: [birth year], 54 y.o.   MRN: [id number]  HPI The patient is a 54 YO man coming in for several concerns including wanting to get on renal transplant list (he has some vague concerns about not being eligible and needing notes from his doctors, unclear) and mobility (he is frustrated about trying to get scooter, has one now but not working well, last one gotten unclear amount of years ago, mobility issues past stroke but walks short distances), and diabetes (episode with EMS in the last bit where they were called to his home due to him not answering door for services several days in a row, he was awakened to people in his home, states he had low sugar and BP and he declined going to hospital, he claims he had not eaten in a week but unsure why, is trying to get his brother from [place] here to help care for him).   Review of Systems  Constitutional: Positive for activity change and appetite change.  HENT: Negative.   Eyes: Negative.   Respiratory: Negative for cough, chest tightness and shortness of breath.   Cardiovascular: Negative for chest pain, palpitations and leg swelling.  Gastrointestinal: Negative for abdominal distention, abdominal pain, constipation, diarrhea, nausea and vomiting.  Musculoskeletal: Positive for gait problem and myalgias.  Skin: Negative.   Neurological: Positive for weakness.  Psychiatric/Behavioral: Negative.     Objective:  Physical Exam Constitutional:      Appearance: He is well-developed.  HENT:     Head: Normocephalic and atraumatic.  Cardiovascular:     Rate and Rhythm: Normal rate and regular rhythm.  Pulmonary:     Effort: Pulmonary effort is normal. No respiratory distress.     Breath sounds: Normal breath sounds. No wheezing or rales.  Abdominal:     General: Bowel sounds are normal. There is no distension.     Palpations: Abdomen is soft.     Tenderness: There is no abdominal tenderness.  There is no rebound.  Musculoskeletal:     Cervical back: Normal range of motion.  Skin:    General: Skin is warm and dry.  Neurological:     Mental Status: He is alert and oriented to person, place, and time.     Coordination: Coordination abnormal.     Comments: Scooter to get around office     Vitals:   12/08/19 1014  BP: 126/82  Pulse: 87  Temp: 98 F (36.7 C)  TempSrc: Oral  SpO2: 96%  Height: 5\' 10"  (1.778 m)    This visit occurred during the SARS-CoV-2 public health emergency.  Safety protocols were in place, including screening questions prior to the visit, additional usage of staff PPE, and extensive cleaning of exam room while observing appropriate contact time as indicated for disinfecting solutions.   Assessment & Plan:

## 2019-12-08 NOTE — Progress Notes (Signed)
No egg or soy allergy known to patient  No issues with past sedation with any surgeries  or procedures, no intubation problems  No diet pills per patient No home 02 use per patient  blood thinners per patient = pt takes Coumadin - will stop 5 days prior to colon 3-12 Pt denies issues with constipation  No A fib or A flutter  EMMI video sent to pt's e mail   Due to the COVID-19 pandemic we are asking patients to follow these guidelines. Please only bring one care partner. Please be aware that your care partner may wait in the car in the parking lot or if they feel like they will be too hot to wait in the car, they may wait in the lobby on the 4th floor. All care partners are required to wear a mask the entire time (we do not have any that we can provide them), they need to practice social distancing, and we will do a Covid check for all patient's and care partners when you arrive. Also we will check their temperature and your temperature. If the care partner waits in their car they need to stay in the parking lot the entire time and we will call them on their cell phone when the patient is ready for discharge so they can bring the car to the front of the building. Also all patient's will need to wear a mask into building.

## 2019-12-08 NOTE — Assessment & Plan Note (Signed)
Place referral to [facility] care management to see if there are other services he qualifies for to help given this confusing social situation. Letter done for patient to help in assisting his brother to come from [place] to help care for him.

## 2019-12-08 NOTE — Assessment & Plan Note (Signed)
It would appear from review of records from [place] that there are several factors holding him from getting on transplant list including BMI, current usage of midodrine for blood pressure.

## 2019-12-08 NOTE — Assessment & Plan Note (Signed)
Awaiting evaluation for scooter and mobility.

## 2019-12-08 NOTE — Patient Instructions (Signed)
We have given you a letter for [place] embassy.

## 2019-12-09 DIAGNOSIS — N186 End stage renal disease: Secondary | ICD-10-CM | POA: Diagnosis not present

## 2019-12-09 DIAGNOSIS — N2581 Secondary hyperparathyroidism of renal origin: Secondary | ICD-10-CM | POA: Diagnosis not present

## 2019-12-09 DIAGNOSIS — Z992 Dependence on renal dialysis: Secondary | ICD-10-CM | POA: Diagnosis not present

## 2019-12-10 DIAGNOSIS — Z992 Dependence on renal dialysis: Secondary | ICD-10-CM | POA: Diagnosis not present

## 2019-12-10 DIAGNOSIS — E1122 Type 2 diabetes mellitus with diabetic chronic kidney disease: Secondary | ICD-10-CM | POA: Diagnosis not present

## 2019-12-10 DIAGNOSIS — N186 End stage renal disease: Secondary | ICD-10-CM | POA: Diagnosis not present

## 2019-12-11 ENCOUNTER — Other Ambulatory Visit: Payer: Self-pay

## 2019-12-11 NOTE — Patient Outreach (Addendum)
[name] [name]) Care Management  12/11/2019  [name] [name] Jan 06, 1966 [id number]   Referral Date: Referral Source: Referral Reason:   Outreach Attempt: Spoke with patient. He reports that he is trying to get an increase in his PCS hours.  He states he has 2 hrs per day right now 7 days a week but is in need of more.  Patient also states he is in the process of trying to get a new scooter.  He states referral is with [facility].  Advised patient that CM would call them to inquire about the referral.    Social: Patient lives alone and needs assistance with all care.  He currently has PCS services but needs more hours.  Patient denies any support in the area right now. He is working with the embassy to get a visa for his brother to come and help him.  Physician completed a letter for him.     Conditions: Patient with multiple co morbidities of DM, ESRD, CHF, HTN, A. Flutter, sleep apnea, and obesity.  Patient goes to [name] on Tuesday, Thursday, and Saturday.  Patient currently active with [facility] Nurse in the D-SNP program.   Medications: Patient reports he has all his medications and takes as prescribed.     Appointments:  Patient saw PCP last week.    Consent: Discussed [facility] services and support.  Patient agreeable to social work referral at this time.   Plan: RN CM will refer to social work for increase in PCS hours.    Update: Telephone call Outpatient rehabilitation [phone].  They do have the referral for new scooter evaluation, but they can only do 2 referrals per month due to [place]. They are in the process of making appointments and will call patient when appointment available.  Telephone call back to patient to advise of scooter evaluation status.  He verbalized understanding.     [name] Baseman, RN, MSN [facility] Care Management Care Management Coordinator Direct Line [phone] Toll Free: [phone]  Fax: [phone]

## 2019-12-12 ENCOUNTER — Encounter: Payer: Medicare HMO | Admitting: Internal Medicine

## 2019-12-12 DIAGNOSIS — N186 End stage renal disease: Secondary | ICD-10-CM | POA: Diagnosis not present

## 2019-12-12 DIAGNOSIS — N2581 Secondary hyperparathyroidism of renal origin: Secondary | ICD-10-CM | POA: Diagnosis not present

## 2019-12-12 DIAGNOSIS — Z992 Dependence on renal dialysis: Secondary | ICD-10-CM | POA: Diagnosis not present

## 2019-12-14 DIAGNOSIS — Z992 Dependence on renal dialysis: Secondary | ICD-10-CM | POA: Diagnosis not present

## 2019-12-14 DIAGNOSIS — N2581 Secondary hyperparathyroidism of renal origin: Secondary | ICD-10-CM | POA: Diagnosis not present

## 2019-12-14 DIAGNOSIS — N186 End stage renal disease: Secondary | ICD-10-CM | POA: Diagnosis not present

## 2019-12-16 DIAGNOSIS — N2581 Secondary hyperparathyroidism of renal origin: Secondary | ICD-10-CM | POA: Diagnosis not present

## 2019-12-16 DIAGNOSIS — Z992 Dependence on renal dialysis: Secondary | ICD-10-CM | POA: Diagnosis not present

## 2019-12-16 DIAGNOSIS — N186 End stage renal disease: Secondary | ICD-10-CM | POA: Diagnosis not present

## 2019-12-18 ENCOUNTER — Other Ambulatory Visit: Payer: Self-pay | Admitting: Endocrinology

## 2019-12-18 ENCOUNTER — Other Ambulatory Visit: Payer: Self-pay | Admitting: Internal Medicine

## 2019-12-19 DIAGNOSIS — Z992 Dependence on renal dialysis: Secondary | ICD-10-CM | POA: Diagnosis not present

## 2019-12-19 DIAGNOSIS — N2581 Secondary hyperparathyroidism of renal origin: Secondary | ICD-10-CM | POA: Diagnosis not present

## 2019-12-19 DIAGNOSIS — N186 End stage renal disease: Secondary | ICD-10-CM | POA: Diagnosis not present

## 2019-12-20 ENCOUNTER — Encounter: Payer: Self-pay | Admitting: *Deleted

## 2019-12-20 ENCOUNTER — Other Ambulatory Visit: Payer: Self-pay | Admitting: *Deleted

## 2019-12-20 NOTE — Patient Outreach (Signed)
[facility]) Care Management  12/20/2019  [name] [name] 20-Oct-1965 [id number]  CSW was able to make initial contact with patient today to perform phone assessment, as well as assess and assist with social work needs and services.  CSW introduced self, explained role and types of services provided through [facility] ([facility]).  CSW further explained to patient that CSW works with patient's Telephonic RNCM, also with [facility], [name] [name].  CSW then explained the reason for the call, indicating that Mrs. [name] [name] thought that patient would benefit from social work services and resources to assist with increasing patient's hours of PCS ([place]), through [employer], as well as assist patient with changing the home health agency provider.  CSW obtained two HIPAA compliant identifiers from patient, which included patient's name and date of birth.  Patient admitted to receiving 2 hours of PCS per day, wanting to know if he is eligible to receive an increase in hours of service, in addition to wanting to change the agency that is currently providing his PCS.  CSW explained to patient that he will need to complete the following application:  [place] for Independent Assessment for [place] of Medical Need Form, if wanting to determine whether or not he is eligible to receive an increase in service hours.  In addition, if patient is requesting to change the home health agency from which he currently receives PCS, a [place] of Provider Request Form must be completed and submitted to [employer] for review.    CSW agreed to email [email or web address]) patient both applications, in addition to a  complete list of all PCS providers in the [place].  CSW was able to confirm that patient received the email, reviewing the information with patient while conversing over the phone.  Patient denied having any additional questions about the application process or submission of applications to determine eligibility.  CSW was able to ensure that patient has the correct contact information for CSW, encouraging patient to contact CSW directly if additional questions arise, or if more social work needs are identified.  Patient voiced understanding and was agreeable to this plan.  Patient admitted to experiencing a great deal of anxiety regarding his medical diagnoses, physician appointments, etc., but denied the need for counseling and supportive services from CSW or a referral to community agencies.  Patient indicated that he has transportation to and from his physician appointments, as well as his dialysis treatments, on Tuesday's, Thursday's and Saturday's.  Patient reported having secure housing, ability to pay for prescription medications and denied food insecurities.  Patient was not interested in completing his Advanced Directives ([place] documents).  CSW will perform a case closure on patient, as all goals of treatment have been met from social work standpoint and no additional social work needs have been identified at this time.  CSW will notify patient's Telephonic RNCM with [facility], [name] [name] of CSW's plans to close patient's case.  CSW will fax an update to patient's Primary Care Physician, Dr. Pricilla [name] to ensure that they are aware of CSW's involvement with patient's plan of care.    [name] [name], BSW, MSW, [employer]  Licensed Research scientist (physical sciences)  [facility]  Mailing Address-[address] [place], [place] [address] Physical Address-[address],  [place], [place] [address] Toll Free Main # [phone] Fax # [phone] Cell # [phone]  Office # [phone] [email or web address]

## 2019-12-21 DIAGNOSIS — Z992 Dependence on renal dialysis: Secondary | ICD-10-CM | POA: Diagnosis not present

## 2019-12-21 DIAGNOSIS — N2581 Secondary hyperparathyroidism of renal origin: Secondary | ICD-10-CM | POA: Diagnosis not present

## 2019-12-21 DIAGNOSIS — N186 End stage renal disease: Secondary | ICD-10-CM | POA: Diagnosis not present

## 2019-12-22 ENCOUNTER — Encounter: Payer: Medicare HMO | Admitting: Internal Medicine

## 2019-12-23 DIAGNOSIS — Z992 Dependence on renal dialysis: Secondary | ICD-10-CM | POA: Diagnosis not present

## 2019-12-23 DIAGNOSIS — N186 End stage renal disease: Secondary | ICD-10-CM | POA: Diagnosis not present

## 2019-12-23 DIAGNOSIS — N2581 Secondary hyperparathyroidism of renal origin: Secondary | ICD-10-CM | POA: Diagnosis not present

## 2019-12-25 ENCOUNTER — Encounter: Payer: Medicare HMO | Admitting: Internal Medicine

## 2019-12-26 ENCOUNTER — Other Ambulatory Visit: Payer: Self-pay

## 2019-12-26 ENCOUNTER — Telehealth: Payer: Self-pay | Admitting: Endocrinology

## 2019-12-26 DIAGNOSIS — N186 End stage renal disease: Secondary | ICD-10-CM | POA: Diagnosis not present

## 2019-12-26 DIAGNOSIS — Z992 Dependence on renal dialysis: Secondary | ICD-10-CM | POA: Diagnosis not present

## 2019-12-26 DIAGNOSIS — N2581 Secondary hyperparathyroidism of renal origin: Secondary | ICD-10-CM | POA: Diagnosis not present

## 2019-12-26 MED ORDER — TOUJEO MAX SOLOSTAR 300 UNIT/ML ~~LOC~~ SOPN: PEN_INJECTOR | SUBCUTANEOUS | 0 refills | Status: DC

## 2019-12-26 NOTE — Telephone Encounter (Signed)
Pharmacy called stating the RX we sent on 12/18/19 for toujeo should've been a 90 day supply and also patient states the dosage should be 80 in morning and 80 at pm. Pharmacy requesting a new RX to be sent.    [place], [place] Phone:  [phone]  Fax:  [phone]

## 2019-12-26 NOTE — Telephone Encounter (Signed)
Rx has been corrected and resent 

## 2019-12-28 DIAGNOSIS — N186 End stage renal disease: Secondary | ICD-10-CM | POA: Diagnosis not present

## 2019-12-28 DIAGNOSIS — N2581 Secondary hyperparathyroidism of renal origin: Secondary | ICD-10-CM | POA: Diagnosis not present

## 2019-12-28 DIAGNOSIS — Z992 Dependence on renal dialysis: Secondary | ICD-10-CM | POA: Diagnosis not present

## 2019-12-30 DIAGNOSIS — Z992 Dependence on renal dialysis: Secondary | ICD-10-CM | POA: Diagnosis not present

## 2019-12-30 DIAGNOSIS — N2581 Secondary hyperparathyroidism of renal origin: Secondary | ICD-10-CM | POA: Diagnosis not present

## 2019-12-30 DIAGNOSIS — N186 End stage renal disease: Secondary | ICD-10-CM | POA: Diagnosis not present

## 2019-12-31 LAB — POCT INR: INR: 3.3 — AB (ref 2.0–3.0)

## 2020-01-01 ENCOUNTER — Telehealth: Payer: Self-pay

## 2020-01-01 NOTE — Telephone Encounter (Signed)
Left patient a VM making him aware that we had to cancel his 01/12/20 appointment due to Dr. Dwyane Dee being out of the office-I requested patient call back to reschedule appointment and labs prior

## 2020-01-02 ENCOUNTER — Ambulatory Visit (INDEPENDENT_AMBULATORY_CARE_PROVIDER_SITE_OTHER): Payer: Medicare HMO | Admitting: Podiatry

## 2020-01-02 ENCOUNTER — Other Ambulatory Visit: Payer: Self-pay

## 2020-01-02 DIAGNOSIS — L97521 Non-pressure chronic ulcer of other part of left foot limited to breakdown of skin: Secondary | ICD-10-CM

## 2020-01-02 DIAGNOSIS — E1149 Type 2 diabetes mellitus with other diabetic neurological complication: Secondary | ICD-10-CM

## 2020-01-02 DIAGNOSIS — L84 Corns and callosities: Secondary | ICD-10-CM | POA: Diagnosis not present

## 2020-01-02 DIAGNOSIS — N2581 Secondary hyperparathyroidism of renal origin: Secondary | ICD-10-CM | POA: Diagnosis not present

## 2020-01-02 DIAGNOSIS — Z992 Dependence on renal dialysis: Secondary | ICD-10-CM | POA: Diagnosis not present

## 2020-01-02 DIAGNOSIS — N186 End stage renal disease: Secondary | ICD-10-CM | POA: Diagnosis not present

## 2020-01-04 DIAGNOSIS — Z992 Dependence on renal dialysis: Secondary | ICD-10-CM | POA: Diagnosis not present

## 2020-01-04 DIAGNOSIS — N2581 Secondary hyperparathyroidism of renal origin: Secondary | ICD-10-CM | POA: Diagnosis not present

## 2020-01-04 DIAGNOSIS — N186 End stage renal disease: Secondary | ICD-10-CM | POA: Diagnosis not present

## 2020-01-06 DIAGNOSIS — Z992 Dependence on renal dialysis: Secondary | ICD-10-CM | POA: Diagnosis not present

## 2020-01-06 DIAGNOSIS — N186 End stage renal disease: Secondary | ICD-10-CM | POA: Diagnosis not present

## 2020-01-06 DIAGNOSIS — N2581 Secondary hyperparathyroidism of renal origin: Secondary | ICD-10-CM | POA: Diagnosis not present

## 2020-01-07 NOTE — Progress Notes (Signed)
Subjective: 54 year old male presents the office today for concerns of his left third toe as well as second toe bleeding and sensitive.  He denies any recent injury.  No swelling or redness.  He also has calluses on the big toe.  Denies any open sores otherwise.Denies any systemic complaints such as fevers, chills, nausea, vomiting. No acute changes since last appointment, and no other complaints at this time.   Objective: AAO x3, NAD-presents in wheelchair but able to transfer DP/PT pulses palpable bilaterally, CRT less than 3 seconds Protective sensation decreased with Simms Weinstein monofilament At the distal aspect the left second third toe is small scabs which are firmly adhered.  There is no drainage or pus there is no edema, erythema.  No tenderness palpation.  Hyperkeratotic lesions bilateral submetatarsal 1 without any underlying ulceration drainage or any signs of infection. No open lesions or pre-ulcerative lesions.  No pain with calf compression, swelling, warmth, erythema          Assessment: Preulcerative areas left foot with hyperkeratotic lesions  Plan: -All treatment options discussed with the patient including all alternatives, risks, complications.  -Debrided the hyperkeratotic lesions without any complications or bleeding.  Regard to this Area Clean and Dry.  Offloading.  Monitoring Signs or Symptoms of Infection or Any Further Skin Breakdown. -Patient encouraged to call the office with any questions, concerns, change in symptoms.   Trula Slade DPM

## 2020-01-08 ENCOUNTER — Other Ambulatory Visit: Payer: Medicare Other

## 2020-01-09 DIAGNOSIS — N186 End stage renal disease: Secondary | ICD-10-CM | POA: Diagnosis not present

## 2020-01-09 DIAGNOSIS — N2581 Secondary hyperparathyroidism of renal origin: Secondary | ICD-10-CM | POA: Diagnosis not present

## 2020-01-09 DIAGNOSIS — Z992 Dependence on renal dialysis: Secondary | ICD-10-CM | POA: Diagnosis not present

## 2020-01-10 DIAGNOSIS — E1122 Type 2 diabetes mellitus with diabetic chronic kidney disease: Secondary | ICD-10-CM | POA: Diagnosis not present

## 2020-01-10 DIAGNOSIS — Z992 Dependence on renal dialysis: Secondary | ICD-10-CM | POA: Diagnosis not present

## 2020-01-10 DIAGNOSIS — N186 End stage renal disease: Secondary | ICD-10-CM | POA: Diagnosis not present

## 2020-01-11 DIAGNOSIS — N186 End stage renal disease: Secondary | ICD-10-CM | POA: Diagnosis not present

## 2020-01-11 DIAGNOSIS — Z992 Dependence on renal dialysis: Secondary | ICD-10-CM | POA: Diagnosis not present

## 2020-01-11 DIAGNOSIS — N2581 Secondary hyperparathyroidism of renal origin: Secondary | ICD-10-CM | POA: Diagnosis not present

## 2020-01-12 ENCOUNTER — Ambulatory Visit: Payer: Medicare Other | Admitting: Endocrinology

## 2020-01-13 DIAGNOSIS — N2581 Secondary hyperparathyroidism of renal origin: Secondary | ICD-10-CM | POA: Diagnosis not present

## 2020-01-13 DIAGNOSIS — Z992 Dependence on renal dialysis: Secondary | ICD-10-CM | POA: Diagnosis not present

## 2020-01-13 DIAGNOSIS — N186 End stage renal disease: Secondary | ICD-10-CM | POA: Diagnosis not present

## 2020-01-15 ENCOUNTER — Ambulatory Visit: Payer: Medicare HMO | Admitting: Endocrinology

## 2020-01-16 DIAGNOSIS — N2581 Secondary hyperparathyroidism of renal origin: Secondary | ICD-10-CM | POA: Diagnosis not present

## 2020-01-16 DIAGNOSIS — Z992 Dependence on renal dialysis: Secondary | ICD-10-CM | POA: Diagnosis not present

## 2020-01-16 DIAGNOSIS — N186 End stage renal disease: Secondary | ICD-10-CM | POA: Diagnosis not present

## 2020-01-18 DIAGNOSIS — N186 End stage renal disease: Secondary | ICD-10-CM | POA: Diagnosis not present

## 2020-01-18 DIAGNOSIS — N2581 Secondary hyperparathyroidism of renal origin: Secondary | ICD-10-CM | POA: Diagnosis not present

## 2020-01-18 DIAGNOSIS — Z992 Dependence on renal dialysis: Secondary | ICD-10-CM | POA: Diagnosis not present

## 2020-01-19 ENCOUNTER — Ambulatory Visit: Payer: Medicare HMO | Admitting: Endocrinology

## 2020-01-19 ENCOUNTER — Ambulatory Visit: Payer: Medicare Other | Admitting: Internal Medicine

## 2020-01-19 DIAGNOSIS — I871 Compression of vein: Secondary | ICD-10-CM | POA: Diagnosis not present

## 2020-01-19 DIAGNOSIS — N186 End stage renal disease: Secondary | ICD-10-CM | POA: Diagnosis not present

## 2020-01-19 DIAGNOSIS — T82858A Stenosis of vascular prosthetic devices, implants and grafts, initial encounter: Secondary | ICD-10-CM | POA: Diagnosis not present

## 2020-01-19 DIAGNOSIS — Z992 Dependence on renal dialysis: Secondary | ICD-10-CM | POA: Diagnosis not present

## 2020-01-20 DIAGNOSIS — N2581 Secondary hyperparathyroidism of renal origin: Secondary | ICD-10-CM | POA: Diagnosis not present

## 2020-01-20 DIAGNOSIS — Z992 Dependence on renal dialysis: Secondary | ICD-10-CM | POA: Diagnosis not present

## 2020-01-20 DIAGNOSIS — N186 End stage renal disease: Secondary | ICD-10-CM | POA: Diagnosis not present

## 2020-01-22 ENCOUNTER — Telehealth: Payer: Self-pay

## 2020-01-22 ENCOUNTER — Ambulatory Visit: Payer: Medicare HMO | Admitting: Cardiovascular Disease

## 2020-01-22 NOTE — Telephone Encounter (Signed)
Left general message on patients voicemail to please call either Leafy Ro or Randall An RN coumadin nurses to review his INR result received today at [facility] and phone number [phone] given to call.    I will copy Randall An RN on this in case I am unavailable when patient calls back. Received message today from Dr. Charlynne Cousins office with the following information:  Fax received on 4/12 from [facility]:   Lab date: 01/09/20  PT is 33.4  INR is 3.23    Need to clarify the following:  1.  Last Inr check on record for 09/2019.  Has he had his level checked by another office or managed elsewhere in the interim?    2.  What is his current dosing?   3.  Any changes to diet, health or medications?   4.  Also this lab is from 01/09/20 of 3.2 from [facility].  We need an updated result.  [facility] to [facility] and see if they have a more recent level before we can plan dosing, or see if we can schedule patient to come by for an INR check.  Will call [facility] tomorrow am to see if we can get more information and how we can coordinate more timely notification of results.    Will copy Joanna Puff on this as well for when she returns so she will up to date.   Thanks.

## 2020-01-23 ENCOUNTER — Telehealth: Payer: Self-pay | Admitting: Internal Medicine

## 2020-01-23 DIAGNOSIS — N2581 Secondary hyperparathyroidism of renal origin: Secondary | ICD-10-CM | POA: Diagnosis not present

## 2020-01-23 DIAGNOSIS — Z992 Dependence on renal dialysis: Secondary | ICD-10-CM | POA: Diagnosis not present

## 2020-01-23 DIAGNOSIS — N186 End stage renal disease: Secondary | ICD-10-CM | POA: Diagnosis not present

## 2020-01-23 NOTE — Telephone Encounter (Signed)
Patient has not been keeping his coumadin appts and his PT was abnormal today.  He did not go to the hospital as he indicated he would.  Leafy Ro spoke with the patient this am and she is not sure he understands how to be taking his coumadin.  Patient had not held his coumadin as he was for the colonoscopy that was scheduled for tomorrow ( now cancelled).  Patient  Has not rescheduled at this point.  He has cancelled and resch this procedure multiple times since office visit in January.

## 2020-01-23 NOTE — Telephone Encounter (Signed)
I recommend visit with provider if this cannot be addressed over phone.

## 2020-01-23 NOTE — Telephone Encounter (Signed)
Mandy from [facility] is calling- she states that she was following up with this patient and saw that he was having a colonoscopy today (before he cancelled) and states that she was concerned about him having one. She states that he is on coumadin and does dialysis and that they were not aware he was stopping his coumadin and that he has not followed up with his coumadin nurse since December. She wanted to talk to you about patient.

## 2020-01-23 NOTE — Telephone Encounter (Signed)
Patient returned call.   He would like a call back before 11 today if possible due to having dialysis

## 2020-01-23 NOTE — Telephone Encounter (Signed)
Pt states he will go to the ED to be evaluated because he does not feel good. Relayed information from Dr. Carlean Purl.

## 2020-01-23 NOTE — Telephone Encounter (Signed)
Pt cx'ed colonoscopy scheduled 01/24/20 due to unable to organise transportation and a chaperone.  Pt requested to have his procedure done at the hospital.  He stated that he is "hurting badly and would like to be admitted to hospital for 2-3 days, have dialysis and have colonoscopy done as well."

## 2020-01-23 NOTE — Telephone Encounter (Signed)
What he is requesting is not an option - his insurance will not cover that  If he feels bad he can go to ED at [facility] for an evaluation to see if he needs to be admitted

## 2020-01-23 NOTE — Telephone Encounter (Signed)
I spoke with [facility] :  Phone [phone] regarding patients scheduled dialysis today.  They will check his INR and fax results directly here to me today (01/23/20).  Nurse at [facility] did confirm an INR was checked there on 01/18/20 with an INR of 3.4.    I also spoke with patient, he says that dialysis has been checking his INR every Thursday and sending it.  No one else has been managing it per his report.  He says that he has been taking "1 pill per day" but doesn't know what the mg strength is.   I told patient that I would call him this evening or tomorrow am with coumadin instructions but as he was 3.4 on 4/8, I did want him to hold his dose today in case he didn't hear from me until in the am.  Patient states he also has been calling Dr. Charlynne Cousins office multiple times requesting a refill on his coumadin as he is almost out in the next few days.  I do not see any refill request calls in Epic for this medication.   While searching his chart, I did notice that he called GI today to cancel a colonoscopy that he was supposed to have tomorrow!  He never held his coumadin, this was not communicated to coumadin nurse, Jenny Reichmann or his PCP and I do not see hold plans anywhere in the chart or where this was reviewed with patient. I immediately contacted Dr. Celesta Aver office to discuss this and flag this for them for future reschedule needs.    Dr. Celesta Aver office states that they received a clearance with a 5 day hold order from Dr. Kathlen Mody, patient's cardiologist, but doesn't appear any of this was communicated to Dr. Sharlet Salina (PCP) or Jenny Reichmann (Coumadin Nurse) for management.  Patient is supratherapeutic today and was not on a hold so good thing colonoscopy was cancelled.  Patient also mentioned to them on the phone this am that he didn't feel good and was going to the ER.  Patient never mentioned this to me today while speaking with him.  He is planning to go to dialysis per his usual schedule.   This  is a very complex case, and I am not sure what is going on or how patient fell off of the radar from December to present.  I do not know if there is a language barrier but there are significant breakdowns in communication.   I will continue to follow up with this until Villa Herb, RN returns and copy Dr. Sharlet Salina on communication.  Also, as patient is almost out of coumadin, I will work on getting him a 30 day supply to his local pharmacy (as he also will be out before any mail order can arrive) and will have Jenny Reichmann review appropriateness of a 90 day supply for mail order given patient's difficulty with compliance.

## 2020-01-24 ENCOUNTER — Encounter: Payer: Medicare HMO | Admitting: Internal Medicine

## 2020-01-24 NOTE — Telephone Encounter (Signed)
Called the [facility] twice today, they still do not have a result on his INR,should be back in tomorrow. I will call again tomorrow.  I did speak with patient tonight.  He will take 1/2 pill (2.5mg ) today and wait for further instructions tomorrow after we receive report. He was able to confirm that he was taking 1 (5mg ) pill daily prior to yesterday.  Dosing him now based on 3.4 reading last Thursday.   He did hold his coumadin last night.   Will follow up with patient again tomorrow and discuss refill on his warfarin.  He states he has enough supply to carry him until his mail order comes in.

## 2020-01-25 DIAGNOSIS — N2581 Secondary hyperparathyroidism of renal origin: Secondary | ICD-10-CM | POA: Diagnosis not present

## 2020-01-25 DIAGNOSIS — N186 End stage renal disease: Secondary | ICD-10-CM | POA: Diagnosis not present

## 2020-01-25 DIAGNOSIS — Z992 Dependence on renal dialysis: Secondary | ICD-10-CM | POA: Diagnosis not present

## 2020-01-25 MED ORDER — WARFARIN SODIUM 5 MG PO TABS: ORAL_TABLET | ORAL | 0 refills | Status: DC

## 2020-01-25 NOTE — Telephone Encounter (Signed)
Received fax from [facility] with result for INR of 3.41 and PT 34.2

## 2020-01-25 NOTE — Telephone Encounter (Signed)
[place] and spoke with [facility] [facility]. She reported they do not have a result. She contacted lab they send to while on the phone and they said they do not have any PT/INR. Asked if this meant there was never an order or if the result was not ready. Amedeo Gory said she did not know. She will fax result from 4/8 in case it has not been received. She will fax any other results she may get today.  Pt needs refill of coumadin. Sent in refill to mail order pharmacy.

## 2020-01-25 NOTE — Telephone Encounter (Signed)
Contacted pt and advised it is important to have apt with PCP tomorrow. Advised of apt at 1:40 and pt agreed and wrote the time down. Advised pt he could take his normal dose for tonight and that they would check it at his apt tomorrow. Advised if anything is needed to f/u with Dr. Nathanial Millman office. Pt verbalized understanding and was very appreciative.

## 2020-01-25 NOTE — Telephone Encounter (Signed)
Contacted lab that [facility], [employer], at [phone]. They reported they have not had any lab requests for pt or any specimens for this pt since 01/18/20.  Contacted [facility] and spoke with [place]. Advised pt needs apt with PCP for INR testing and coumadin management. She placed him on the schedule for 1:40 tomorrow, 4/16.   LVM

## 2020-01-26 ENCOUNTER — Ambulatory Visit: Payer: Medicare HMO | Admitting: Internal Medicine

## 2020-01-26 ENCOUNTER — Other Ambulatory Visit: Payer: Self-pay

## 2020-01-26 ENCOUNTER — Other Ambulatory Visit (INDEPENDENT_AMBULATORY_CARE_PROVIDER_SITE_OTHER): Payer: Medicare HMO

## 2020-01-26 DIAGNOSIS — E1165 Type 2 diabetes mellitus with hyperglycemia: Secondary | ICD-10-CM

## 2020-01-26 DIAGNOSIS — Z794 Long term (current) use of insulin: Secondary | ICD-10-CM

## 2020-01-26 LAB — GLUCOSE, RANDOM: Glucose, Bld: 301 mg/dL — ABNORMAL HIGH (ref 70–99)

## 2020-01-26 LAB — HEMOGLOBIN A1C: Hgb A1c MFr Bld: 7.6 % — ABNORMAL HIGH (ref 4.6–6.5)

## 2020-01-27 DIAGNOSIS — N186 End stage renal disease: Secondary | ICD-10-CM | POA: Diagnosis not present

## 2020-01-27 DIAGNOSIS — Z992 Dependence on renal dialysis: Secondary | ICD-10-CM | POA: Diagnosis not present

## 2020-01-27 DIAGNOSIS — N2581 Secondary hyperparathyroidism of renal origin: Secondary | ICD-10-CM | POA: Diagnosis not present

## 2020-01-27 LAB — FRUCTOSAMINE: Fructosamine: 387 umol/L — ABNORMAL HIGH (ref 0–285)

## 2020-01-29 LAB — POCT INR: INR: 1.4 — AB (ref 2.0–3.0)

## 2020-01-30 ENCOUNTER — Ambulatory Visit (INDEPENDENT_AMBULATORY_CARE_PROVIDER_SITE_OTHER): Payer: Medicare HMO | Admitting: General Practice

## 2020-01-30 DIAGNOSIS — I82502 Chronic embolism and thrombosis of unspecified deep veins of left lower extremity: Secondary | ICD-10-CM

## 2020-01-30 DIAGNOSIS — N2581 Secondary hyperparathyroidism of renal origin: Secondary | ICD-10-CM | POA: Diagnosis not present

## 2020-01-30 DIAGNOSIS — Z992 Dependence on renal dialysis: Secondary | ICD-10-CM | POA: Diagnosis not present

## 2020-01-30 DIAGNOSIS — N186 End stage renal disease: Secondary | ICD-10-CM | POA: Diagnosis not present

## 2020-01-30 NOTE — Patient Instructions (Signed)
Pre visit review using our clinic review tool, if applicable. No additional management support is needed unless otherwise documented below in the visit note.  Fax received on 4/20.  Continue to take 1 tablet daily except 1/2 tablet on Wednesdays.   Dosing instructions left on VM.   Re-check in 1 week.

## 2020-01-30 NOTE — Progress Notes (Signed)
Medical treatment/procedure(s) were performed by non-physician practitioner and as supervising physician I was immediately available for consultation/collaboration. I agree with above. Chazz Philson A Nikelle Malatesta, MD  

## 2020-01-31 ENCOUNTER — Ambulatory Visit: Payer: Medicare HMO | Admitting: Endocrinology

## 2020-02-01 DIAGNOSIS — N2581 Secondary hyperparathyroidism of renal origin: Secondary | ICD-10-CM | POA: Diagnosis not present

## 2020-02-01 DIAGNOSIS — N186 End stage renal disease: Secondary | ICD-10-CM | POA: Diagnosis not present

## 2020-02-01 DIAGNOSIS — Z992 Dependence on renal dialysis: Secondary | ICD-10-CM | POA: Diagnosis not present

## 2020-02-01 LAB — PROTIME-INR: INR: 1.4 — AB (ref 0.9–1.1)

## 2020-02-03 DIAGNOSIS — Z992 Dependence on renal dialysis: Secondary | ICD-10-CM | POA: Diagnosis not present

## 2020-02-03 DIAGNOSIS — N186 End stage renal disease: Secondary | ICD-10-CM | POA: Diagnosis not present

## 2020-02-03 DIAGNOSIS — N2581 Secondary hyperparathyroidism of renal origin: Secondary | ICD-10-CM | POA: Diagnosis not present

## 2020-02-05 ENCOUNTER — Telehealth: Payer: Self-pay

## 2020-02-05 ENCOUNTER — Ambulatory Visit (INDEPENDENT_AMBULATORY_CARE_PROVIDER_SITE_OTHER): Payer: Medicare HMO | Admitting: General Practice

## 2020-02-05 DIAGNOSIS — Z7901 Long term (current) use of anticoagulants: Secondary | ICD-10-CM

## 2020-02-05 LAB — POCT INR: INR: 1.4 — AB (ref 2.0–3.0)

## 2020-02-05 NOTE — Patient Instructions (Addendum)
Pre visit review using our clinic review tool, if applicable. No additional management support is needed unless otherwise documented below in the visit note.  Fax received on 4/26.   Take 1 1/2 tablets today, tomorrow.  Wednesday take 1 tablet and Thursday resume taking 1 tablet daily except 1/2 tablet on Wednesdays.  Re-check in 1 week.  Dialysis patient.  Lab [facility] INR results to PCP.   I spoke directly with patient and he did verbalize understanding.

## 2020-02-05 NOTE — Telephone Encounter (Signed)
[name] [name] with [facility] calling and states that they are needing OV notes that mention the patient's power chair. Also, needing insurance information. Please advise.  CB#: [phone] Fax#: [phone]

## 2020-02-06 DIAGNOSIS — Z992 Dependence on renal dialysis: Secondary | ICD-10-CM | POA: Diagnosis not present

## 2020-02-06 DIAGNOSIS — N2581 Secondary hyperparathyroidism of renal origin: Secondary | ICD-10-CM | POA: Diagnosis not present

## 2020-02-06 DIAGNOSIS — N186 End stage renal disease: Secondary | ICD-10-CM | POA: Diagnosis not present

## 2020-02-06 NOTE — Telephone Encounter (Signed)
Please see message. °

## 2020-02-07 NOTE — Telephone Encounter (Signed)
Called and spoke with Mardene Celeste discussed message from dr. Sharlet Salina. Nothing further is needed until PT evaluation is complete

## 2020-02-07 NOTE — Telephone Encounter (Signed)
He has not yet had evaluation with PT for assessment for scooter/wheelchair. In referral it appears that they have been trying to call him to set up this appointment. This is the first step in getting a scooter/power wheelchair. Once PT evaluation done, needs in person visit to discuss only the mobility with me, then orders could be sent to a home health company of his choice. He should call back rehab to get assessed.

## 2020-02-08 DIAGNOSIS — N2581 Secondary hyperparathyroidism of renal origin: Secondary | ICD-10-CM | POA: Diagnosis not present

## 2020-02-08 DIAGNOSIS — Z992 Dependence on renal dialysis: Secondary | ICD-10-CM | POA: Diagnosis not present

## 2020-02-08 DIAGNOSIS — N186 End stage renal disease: Secondary | ICD-10-CM | POA: Diagnosis not present

## 2020-02-09 DIAGNOSIS — N186 End stage renal disease: Secondary | ICD-10-CM | POA: Diagnosis not present

## 2020-02-09 DIAGNOSIS — E1122 Type 2 diabetes mellitus with diabetic chronic kidney disease: Secondary | ICD-10-CM | POA: Diagnosis not present

## 2020-02-09 DIAGNOSIS — Z992 Dependence on renal dialysis: Secondary | ICD-10-CM | POA: Diagnosis not present

## 2020-02-10 DIAGNOSIS — N186 End stage renal disease: Secondary | ICD-10-CM | POA: Diagnosis not present

## 2020-02-10 DIAGNOSIS — N2581 Secondary hyperparathyroidism of renal origin: Secondary | ICD-10-CM | POA: Diagnosis not present

## 2020-02-10 DIAGNOSIS — Z992 Dependence on renal dialysis: Secondary | ICD-10-CM | POA: Diagnosis not present

## 2020-02-12 ENCOUNTER — Ambulatory Visit: Payer: Medicare HMO | Admitting: Podiatry

## 2020-02-12 ENCOUNTER — Ambulatory Visit (INDEPENDENT_AMBULATORY_CARE_PROVIDER_SITE_OTHER): Payer: Medicare HMO | Admitting: General Practice

## 2020-02-12 DIAGNOSIS — Z7901 Long term (current) use of anticoagulants: Secondary | ICD-10-CM

## 2020-02-12 DIAGNOSIS — I82502 Chronic embolism and thrombosis of unspecified deep veins of left lower extremity: Secondary | ICD-10-CM | POA: Diagnosis not present

## 2020-02-12 LAB — POCT INR: INR: 2.4 (ref 2.0–3.0)

## 2020-02-12 NOTE — Patient Instructions (Signed)
Pre visit review using our clinic review tool, if applicable. No additional management support is needed unless otherwise documented below in the visit note.  Fax received on 5/3   Continue to take 1 tablet daily except 1/2 tablet on Wednesdays.  Re-check in 1 week.  Dialysis patient.  Lab [facility] INR results to PCP.   I spoke directly with patient and he did verbalize understanding.

## 2020-02-13 DIAGNOSIS — Z992 Dependence on renal dialysis: Secondary | ICD-10-CM | POA: Diagnosis not present

## 2020-02-13 DIAGNOSIS — N186 End stage renal disease: Secondary | ICD-10-CM | POA: Diagnosis not present

## 2020-02-13 DIAGNOSIS — N2581 Secondary hyperparathyroidism of renal origin: Secondary | ICD-10-CM | POA: Diagnosis not present

## 2020-02-15 ENCOUNTER — Encounter: Payer: Self-pay | Admitting: Internal Medicine

## 2020-02-15 DIAGNOSIS — N2581 Secondary hyperparathyroidism of renal origin: Secondary | ICD-10-CM | POA: Diagnosis not present

## 2020-02-15 DIAGNOSIS — N186 End stage renal disease: Secondary | ICD-10-CM | POA: Diagnosis not present

## 2020-02-15 DIAGNOSIS — Z992 Dependence on renal dialysis: Secondary | ICD-10-CM | POA: Diagnosis not present

## 2020-02-17 DIAGNOSIS — Z992 Dependence on renal dialysis: Secondary | ICD-10-CM | POA: Diagnosis not present

## 2020-02-17 DIAGNOSIS — N2581 Secondary hyperparathyroidism of renal origin: Secondary | ICD-10-CM | POA: Diagnosis not present

## 2020-02-17 DIAGNOSIS — N186 End stage renal disease: Secondary | ICD-10-CM | POA: Diagnosis not present

## 2020-02-19 ENCOUNTER — Ambulatory Visit (INDEPENDENT_AMBULATORY_CARE_PROVIDER_SITE_OTHER): Payer: Medicare HMO | Admitting: General Practice

## 2020-02-19 DIAGNOSIS — Z7901 Long term (current) use of anticoagulants: Secondary | ICD-10-CM | POA: Diagnosis not present

## 2020-02-19 LAB — POCT INR: INR: 1.8 — AB (ref 2.0–3.0)

## 2020-02-19 NOTE — Patient Instructions (Signed)
Pre visit review using our clinic review tool, if applicable. No additional management support is needed unless otherwise documented below in the visit note.  Fax received on 5/10  Take 1 1/2 tablets today and then continue to take 1 tablet daily except 1/2 tablet on Wednesdays.  Re-check in 1 week.  Dialysis patient.  [facility] faxes INR results to PCP.   I spoke directly with patient and he did verbalize understanding.

## 2020-02-20 DIAGNOSIS — N2581 Secondary hyperparathyroidism of renal origin: Secondary | ICD-10-CM | POA: Diagnosis not present

## 2020-02-20 DIAGNOSIS — Z992 Dependence on renal dialysis: Secondary | ICD-10-CM | POA: Diagnosis not present

## 2020-02-20 DIAGNOSIS — N186 End stage renal disease: Secondary | ICD-10-CM | POA: Diagnosis not present

## 2020-02-22 DIAGNOSIS — N2581 Secondary hyperparathyroidism of renal origin: Secondary | ICD-10-CM | POA: Diagnosis not present

## 2020-02-22 DIAGNOSIS — N186 End stage renal disease: Secondary | ICD-10-CM | POA: Diagnosis not present

## 2020-02-22 DIAGNOSIS — Z992 Dependence on renal dialysis: Secondary | ICD-10-CM | POA: Diagnosis not present

## 2020-02-24 DIAGNOSIS — N2581 Secondary hyperparathyroidism of renal origin: Secondary | ICD-10-CM | POA: Diagnosis not present

## 2020-02-24 DIAGNOSIS — Z992 Dependence on renal dialysis: Secondary | ICD-10-CM | POA: Diagnosis not present

## 2020-02-24 DIAGNOSIS — N186 End stage renal disease: Secondary | ICD-10-CM | POA: Diagnosis not present

## 2020-02-27 DIAGNOSIS — N2581 Secondary hyperparathyroidism of renal origin: Secondary | ICD-10-CM | POA: Diagnosis not present

## 2020-02-27 DIAGNOSIS — Z992 Dependence on renal dialysis: Secondary | ICD-10-CM | POA: Diagnosis not present

## 2020-02-27 DIAGNOSIS — N186 End stage renal disease: Secondary | ICD-10-CM | POA: Diagnosis not present

## 2020-02-29 ENCOUNTER — Encounter: Payer: Self-pay | Admitting: Physical Therapy

## 2020-02-29 ENCOUNTER — Ambulatory Visit: Payer: Medicare HMO | Attending: Internal Medicine | Admitting: Physical Therapy

## 2020-02-29 ENCOUNTER — Other Ambulatory Visit: Payer: Self-pay | Admitting: Internal Medicine

## 2020-02-29 ENCOUNTER — Other Ambulatory Visit: Payer: Self-pay | Admitting: Endocrinology

## 2020-02-29 ENCOUNTER — Other Ambulatory Visit: Payer: Self-pay

## 2020-02-29 DIAGNOSIS — M6281 Muscle weakness (generalized): Secondary | ICD-10-CM | POA: Diagnosis not present

## 2020-02-29 DIAGNOSIS — R296 Repeated falls: Secondary | ICD-10-CM

## 2020-02-29 DIAGNOSIS — N186 End stage renal disease: Secondary | ICD-10-CM | POA: Diagnosis not present

## 2020-02-29 DIAGNOSIS — N2581 Secondary hyperparathyroidism of renal origin: Secondary | ICD-10-CM | POA: Diagnosis not present

## 2020-02-29 DIAGNOSIS — R209 Unspecified disturbances of skin sensation: Secondary | ICD-10-CM | POA: Insufficient documentation

## 2020-02-29 DIAGNOSIS — Z992 Dependence on renal dialysis: Secondary | ICD-10-CM | POA: Diagnosis not present

## 2020-02-29 DIAGNOSIS — R208 Other disturbances of skin sensation: Secondary | ICD-10-CM

## 2020-02-29 NOTE — Therapy (Addendum)
[facility] [address] [place], [place], [address] Phone: [phone]   Fax:  [phone]  Physical Therapy Evaluation  Patient Details  Name: [name] [name] MRN: [id number] Date of Birth: 24-Dec-1965 Referring Provider (PT): Hoyt Koch, MD   Encounter Date: 02/29/2020  PT End of Session - 02/29/20 1155    Visit Number  1    Number of Visits  1    Date for PT Re-Evaluation  02/29/20    Authorization Type  [employer] Medicare and Medicaid    PT Start Time  1022    PT Stop Time  1130    PT Time Calculation (min)  68 min    Activity Tolerance  Patient tolerated treatment well    Behavior During Therapy  [facility] for tasks assessed/performed       Past Medical History:  Diagnosis Date  . Allergic rhinitis   . Allergy   . Anemia   . Aortic insufficiency    a. Echo 7/16:  Mod LVH, EF 50-55%, mild to mod AI, severe LAE, PASP 57 mmHg (LV ID end diastolic 69.6 mm);  b. Echo 2/17:  Mod LVH, EF 50-55%, mod AI, MAC, mild MR, mod LAE, mild to mod TR, PASP 63 mmHg  . Bilateral carpal tunnel syndrome   . Cataract    removed both eyes   . CHF (congestive heart failure) ([place])   . Claustrophobia   . COPD (chronic obstructive pulmonary disease) ([place])   . Diabetes mellitus    Type II  . Diabetic peripheral neuropathy ([place])   . Diabetic retinopathy ([place])   . Discitis of lumbar region    resolved  . DVT (deep venous thrombosis) ([place]) 10/07/2015   LLE  . Endocarditis 11/28/14  . ESRD (end stage renal disease) on dialysis [facility])    s/p renal transplant in 2009, failed in 2016  . Gait disorder   . Gastroparesis   . GERD (gastroesophageal reflux disease)   . Gout   . Hearing difficulty   . Hemodialysis patient ([place])    tuesday, thursday, saturday   . Hyperlipidemia   . Hypertension   . Incomplete bladder emptying   . Leg pain 02/05/11   with walking  . Morbid obesity ([place])   . Obstructive sleep apnea    not using  CPAP- lost 100lbs  . Osteomyelitis ([place])   . Posttraumatic stress disorder   . Rotator cuff disorder   . Secondary hyperparathyroidism ([place])   . Sepsis ([place])    Postive blood cultures -   . Sleep apnea    no cpap  . Urethral stricture   . Vitamin D deficiency     Past Surgical History:  Procedure Laterality Date  . ARTERIOVENOUS GRAFT PLACEMENT Bilateral "several"  . AV FISTULA PLACEMENT Bilateral 2015  . AV FISTULA PLACEMENT Right 06/13/2019   Procedure: INSERTION OF ARTERIOVENOUS (AV) GORE-TEX GRAFT RIGHT  ARM;  Surgeon: Elam Dutch, MD;  Location: [place];  Service: Vascular;  Laterality: Right;  . BASCILIC VEIN TRANSPOSITION Right 09/27/2015  . [place] Right 09/27/2015   Procedure: BASILIC VEIN TRANSPOSITION right;  Surgeon: Mal Misty, MD;  Location: [facility];  Service: Vascular;  Laterality: Right;  . CATARACT EXTRACTION W/ INTRAOCULAR LENS  IMPLANT, BILATERAL Bilateral   . CYSTOSCOPY W/ INTERNAL URETHROTOMY  09/2014  . IR THROMBECTOMY AV FISTULA W/THROMBOLYSIS/PTA INC/SHUNT/IMG RIGHT Right 08/01/2019  . IR US GUIDE VASC ACCESS RIGHT  08/01/2019  . KIDNEY TRANSPLANT  2009  . SIGMOIDOSCOPY    . UPPER EXTREMITY VENOGRAPHY Bilateral 06/02/2019   Procedure: UPPER EXTREMITY VENOGRAPHY;  Surgeon: Elam Dutch, MD;  Location: [place];  Service: Cardiovascular;  Laterality: Bilateral;  . UPPER GASTROINTESTINAL ENDOSCOPY      There were no vitals filed for this visit.   Subjective Assessment - 02/29/20 1149    Subjective  Pt arrives on foldable scooter due to riding with a friend and not able to transport power wheelchair.  Pt's power wheelchair is >29 years old and is in poor condition.  Requires power wheelchair for independent mobility in the home.    Pertinent History  2 falls this year; PMH - anemia, aortic insufficiency, bilateral carpal tunnel syndrome, CHF, claustrophobia, COPD, Diabetes and diabetic peripheral neuropathy, diabetic  retinopathy, lumbar discitis, DVT, endocarditis, ESRD on dialysis s/p renal transplant that failed in 2016, gastroparesis, gout, HLD, HTN, obesity, OSA, osteomyelitis, PTSD, rotator cuff disorder, hyperparathyroidism, sepsis, ulcers on bilat feet/toes    Patient Stated Goals  to obtain a new power wheelchair    Currently in Pain?  Yes         [facility] PT Assessment - 02/29/20 1154      Assessment   Medical Diagnosis  Power wheelchair evaluation due to gait instability, falls    Referring Provider (PT)  Hoyt Koch, MD    Onset Date/Surgical Date  11/23/19    Prior Therapy  yes      Precautions   Precautions  Other (comment)    Precaution Comments  2 falls this year; PMH - anemia, aortic insufficiency, bilateral carpal tunnel syndrome, CHF, claustrophobia, COPD, Diabetes and diabetic peripheral neuropathy, diabetic retinopathy, lumbar discitis, DVT, endocarditis, ESRD on dialysis s/p renal transplant that failed in 2016, gastroparesis, gout, HLD, HTN, obesity, OSA, osteomyelitis, PTSD, rotator cuff disorder, hyperparathyroidism, sepsis, ulcers on bilat feet/toes       Balance Screen   Has the patient fallen in the past 6 months  Yes    How many times?  2      [place] residence    Living Arrangements  Alone    Available Help at Discharge  Personal care attendant    Type of [place]  One level      Prior Function   Level of Independence  Independent with household mobility with device;Independent with community mobility with device;Needs assistance with ADLs;Needs assistance with homemaking        Mobility/Seating Evaluation    PATIENT INFORMATION: Name: [name] [name] DOB: 1966-05-17  Sex: Male Date seen: 02/29/2020 Time: 10:22 AM  Address:  [address] [place], [place] [address]  Physician: Hoyt Koch, MD This evaluation/justification form will serve as the  LMN for the following suppliers: __________________________ Supplier: [employer] Contact Person: Luz Brazen Phone:  [phone]   Seating Therapist: Rico Junker, PT, DPT Phone:   [phone]   Phone: [phone]     Spouse/Parent/Caregiver name: ?????  Phone number: ????? Insurance/Payer: [employer] Medicare and Medicaid     Reason for Referral: To obtain a new power wheelchair  Patient/Caregiver Goals: To maintain independent household and community mobility with power wheelchair  Patient was seen for face-to-face evaluation for new power wheelchair.  Also present was [employer] to discuss recommendations and wheelchair options.  Further paperwork was completed and sent to vendor.  Patient appears  to qualify for power mobility device at this time per objective findings.   MEDICAL HISTORY: Diagnosis: Primary Diagnosis: Diabetic Peripheral Neuropathy Onset: 2009 Diagnosis: ESRD on dialysis s/p renal transplant that failed in 2016   _0 Progressive Disease Relevant past and future surgeries: Several AV Fistula placements, Bascilic vein transposition, bilateral cataract extraction with intraocular lens implants, thrombectomy AV fistula, kidney transplant 2009 - failed in 2016, hoping to receive another kidney transplant   Height: 5'10" Weight: 278lb Explain recent changes or trends in weight: Fluctuates due to dialysis but by no more than 10lb   History including Falls: 2 falls this year; PMH - anemia, aortic insufficiency, bilateral carpal tunnel syndrome, CHF, claustrophobia, COPD, Diabetes and diabetic peripheral neuropathy, diabetic retinopathy, lumbar discitis, DVT, endocarditis, ESRD on dialysis s/p renal transplant that failed in 2016, gastroparesis, gout, HLD, HTN, obesity, OSA, osteomyelitis, PTSD, rotator cuff disorder, hyperparathyroidism, sepsis, ulcers on bilat feet/toes    HOME ENVIRONMENT: _1 House  _2 Condo/town home  _3 Apartment  _4 Assisted Living    _5 Lives Alone  _6  Lives with Others                                                                                          Hours with caregiver: Has an aide 2-4 hours a day  _7 Home is accessible to patient           Stairs      _8 Yes _9  No     Ramp _10 Yes _11 No Comments:  Able to access all rooms with current heavy duty power wheelchair   COMMUNITY ADL: TRANSPORTATION: _12 Car    _13 Van    <IRJJOACZYSAYTKZS>_0<\/FUXNATFTDDUKGURK>_27 Public Transportation    _15 [employer] w/c Lift    _16 Ambulance    _17 Other:       _18 Sits in wheelchair during transport  Employment/School: ????? Specific requirements pertaining to mobility Has to attend dialysis 3x/week  Other: Pt typically uses [employer] or Medicaid transportation for appointments - sits in power wheelchair for transport.  When pt is not able to use transportation and rides in a friend's car he uses a foldable scooter since it is light weight and able to be folded and lifted into trunk of car - pt reports he has tipped over while using foldable scooter    FUNCTIONAL/SENSORY PROCESSING SKILLS:  Handedness:   _19 Right     _20 Left    _21 NA  Comments:  ?????  Functional Processing Skills for Wheeled Mobility _22 Processing Skills are adequate for safe wheelchair operation  Areas of concern than may interfere with safe operation of wheelchair Description of problem   _23  Attention to environment      _24 Judgment      _25  Hearing  _26  Vision or visual processing      _27 Motor Planning  _28  Fluctuations in Behavior  ?????    VERBAL COMMUNICATION: _29 WFL receptive _30  WFL expressive _31 Understandable  _32 Difficult to understand  _33 non-communicative _34  Uses an augmented communication device  CURRENT SEATING / MOBILITY: Current Mobility Base:  _35 None _36 Dependent _37 Manual _38 Scooter _39 Power  Type of Control: Medical laboratory scientific officer:  Shoprider Size:  Heavy Duty Age: >7 years  Current Condition of Mobility Base:  Poor - doesn't hold a  charge, seat is unstable, >2 years old   Current Wheelchair components:  Captain's seat and  back  Describe posture in present seating system:  Pt did not arrive in power wheelchair - due to being transported by a friend today pt arrived on [employer] foldable scooter that is not appropriate for patient due to weight.  Pt demonstrates significant hip hip flexion with knees elevated above hips and abducted/externally rotated due to low seat to floor height.  Pt reports he has tipped over when using foldable scooter      SENSATION and SKIN ISSUES: Sensation _0 Intact  _1 Impaired _2 Absent  Level of sensation: in hands and bilat LE due to peripheral neuropathy; pt reports intact sensation in buttocks/hips Pressure Relief: Able to perform effective pressure relief :    _3 Yes  _4  No Method: intermittently stands with support If not, Why?: ?????  Skin Issues/Skin Integrity Current Skin Issues  _5 Yes _6 No _7 Intact _8  Red area_9  Open Area  _10 Scar Tissue _11 At risk from prolonged sitting Where  bilat feet - non healing ulcers placing pt a risk for amputation  History of Skin Issues  _12 Yes _13 No Where  ????? When  ?????  Hx of skin flap surgeries  _14 Yes _15 No Where  ????? When  ?????  Limited sitting tolerance _16 Yes _17 No Hours spent sitting in wheelchair daily: on dialysis days pt receives 4 hours of dialysis and then must return home to rest/sleep; on non-dialysis days pt will be up in power wheelchair >8 hours but will rest in bed to manage LE edema  Complaint of Pain:  Please describe: neuropathic pain due to neuropathy   Swelling/Edema: bilat LE - rests in bed to manage   ADL STATUS (in reference to wheelchair use):  Indep Assist Unable Indep with Equip Not assessed Comments  Dressing _18  _19  _20  _21  _22  performs at w/c level with aide's assistance  Eating _23  _24  _25  _26  _27  ?????  Toileting _28  _29  _30  _31  _32  performs with aide's assistance, transfers from power w/c  Bathing _33  _34  _35  _36  _37  performs with aide's assistance, transfers from power w/c  Grooming/Hygiene _38  _39  _40  _41  _42   performs at w/c level with aide's assistance  Meal Prep _43  _44  _45  _46  _47  Wheelchair level  IADLS <FTDDUKGURKYHCWCB>_7<\/SEGBTDVVOHYWVPXT>_06  _49  _50  _51  _52  Wheelchair level with friend or aide assist  Bowel Management: _53 Continent  _54 Incontinent  _55 Accidents Comments:  due to medication  Bladder Management: _56 Continent  _57 Incontinent  _58 Accidents Comments:  Dialysis     WHEELCHAIR SKILLS: Manual w/c Propulsion: _59 UE or LE strength and endurance sufficient to participate in ADLs using manual wheelchair Arm : _60 left _61 right   _62 Both      Distance: ????? Foot:  _63 left _64 right   _65 Both  Operate Scooter: _66  Strength, hand grip, balance and transfer appropriate for use _67 Living environment is accessible for use of scooter  Operate Power w/c:  _68  Std. Joystick   _69  Alternative Controls Indep _70  Assist _71  Dependent/unable _72  N/A _73   _74 Safe          _75  Functional      Distance: >1.000 ft  Bed confined without wheelchair _76  Yes _77  No   STRENGTH/RANGE OF MOTION:  AROM Range of Motion Strength  Shoulder >100 deg flexion -4/5 - rotator cuff disorder  Elbow WFL -4/5  Wrist/Hand WFL -4/5 - bilat carpal tunnel sydrome  Hip Limited due to weakness +2/5  Knee Limited due to weakness 3/5  Ankle R WFL, L limited due to weakness R 3/5, L 0/5  drop foot     MOBILITY/BALANCE:  _0  Patient is totally dependent for mobility  ?????    Balance Transfers Ambulation  Sitting Balance: Standing Balance: _1  Independent _2  Independent/Modified Independent  _3  WFL     _4  WFL _5  Supervision _6  Supervision  _7  Uses UE for balance  _8  Supervision _9  Min Assist _10  Ambulates with Assist  ?????    _11  Min Assist _12  Min assist _13  Mod Assist _14  Ambulates with Device:      _15  RW  _16  StW  _17  Cane  _18  ?????  _19  Mod Assist _20  Mod assist _21  Max assist   _22  Max Assist _23  Max assist _24  Dependent _25  Indep. Short Distance Only  _26  Unable _27  Unable _28  Lift / Sling Required Distance (in feet)  ?????   _29  Sliding board _30  Unable to Ambulate (see explanation  below)  Cardio Status:  _31 Intact  _32  Impaired   _33  NA     CHF, HTN, aortic insufficiency, endocarditis  Respiratory Status:  _34 Intact   _35 Impaired   _36 NA     COPD, OSA  Orthotics/Prosthetics: None  Comments (Address manual vs power w/c vs scooter): Patient has a mobility limitation that significantly impairs safe and efficient participation in one or more mobility related ADL's that cannot be compensated for with the use of a cane/walker, manual wheelchair or power scooter (POV).  Mr. Hanna is non-ambulatory due to the severity of his diabetic peripheral neuropathy, absent LE sensation, LE weakness and the presence of multiple non-healing ulcers on bilateral feet.  Mr. Dible is unable to stand or transfer without assistance to maintain balance and he reports multiple falls.  Mr. Hanf weight is >250lb and would require the use of a heavy duty manual wheelchair.   Due to carpal tunnel syndrome, bilat UE weakness, impaired sensation, rotator cuff disorder and cardiac and pulmonary dysfunction Mr. Fillinger would not be able to safely or efficiently propel a heavy-duty manual wheelchair for independent household and community mobility.  Mr. Mccauley currently has a foldable scooter that he must use when being transported by friend's (to be able to transport in a car) but an IT trainer scooter is inappropriate for Mr. Alpern due to poor sitting posture and falls risk.  He lacks the standing balance to safely transfer on and off a power scooter and reports tipping his scooter over multiple times.  Mr. Scully home environment also does not support the use of a scooter as he relies on the tight turning radius of a mid-wheel drive power wheelchair.  Also, due to the presence of multiple non-healing ulcers Mr. Marney is at risk for future LE amputations and requires a power wheelchair that could be adapted in the future if needed to support a residual limb, would allow for safe lateral scoot transfers and provide pt with  sufficient support for sitting balance due to an altered center of gravity.           Anterior / Posterior Obliquity Rotation-Pelvis ?????  PELVIS    _37  _38  _39   Neutral Posterior Anterior  _40  _41  _42   WFL Rt elev Lt elev  _43  _44  _45   WFL Right Left                      Anterior    Anterior     _46  Fixed _47  Other _48  Partly Flexible _49  Flexible   _50  Fixed _51  Other _52  Partly Flexible  _53  Flexible  _54  Fixed _55  Other _56  Partly  Flexible  _0  Flexible   TRUNK  _1  _2  _3   WFL ? Thoracic ? Lumbar  Kyphosis Lordosis  _4  _5  _6   WFL Convex Convex  Right Left _7 c-curve _8 s-curve _9 multiple  _10  Neutral _11  Left-anterior _12  Right-anterior     _13  Fixed _14  Flexible _15  Partly Flexible _16  Other  _17  Fixed _18  Flexible _19  Partly Flexible _20  Other  _21  Fixed             _22  Flexible _23  Partly Flexible _24  Other    Position Windswept  ?????  HIPS          _25            _26               _27    Neutral       Abduct        ADduct         _28           _29            _30   Neutral Right           Left      _31  Fixed _32  Subluxed _33  Partly Flexible _34  Dislocated _35  Flexible  _36  Fixed _37  Other _38  Partly Flexible  _39  Flexible                 Foot Positioning Knee Positioning  ?????    _40  WFL  _41 Lt _42 Rt _43  WFL  _44 Lt _45 Rt    KNEES ROM concerns: ROM concerns:    & Dorsi-Flexed _46 Lt _47 Rt ?????    FEET Plantar Flexed _48 Lt _49 Rt      Inversion                 _50 Lt _51 Rt      Eversion                 _52 Lt _53 Rt     HEAD _54  Functional _55  Good Head Control  ?????  & _56  Flexed         _57  Extended _58  Adequate Head Control    NECK _59  Rotated  Lt  _60  Lat Flexed Lt _61  Rotated  Rt _62  Lat Flexed Rt _63  Limited Head Control     _64  Cervical Hyperextension _65  Absent  Head Control     SHOULDERS ELBOWS WRIST& HAND ?????      Left     Right    Left     Right    Left     Right   U/E _66 Functional           _67 Functional WFL WFL _68 Fisting             _69 Fisting      _70 elev   _71 dep      _72 elev   _73 dep       _74 pro  -_75 retract     _76 pro  _77 retract _78 subluxed             _79 subluxed           Goals for Wheelchair Mobility  _80  Independence with mobility in the home with motor related ADLs (MRADLs)  _81  Independence with MRADLs in the community _82  Provide dependent mobility  _83  Provide recline     _84 Provide tilt   Goals for Seating system _85  Optimize pressure distribution _86  Provide support needed to facilitate function or safety _87  Provide corrective forces to assist with maintaining or improving posture _88  Accommodate client's posture:   current seated postures and  positions are not flexible or will not tolerate corrective forces _0  Client to be independent with relieving pressure in the wheelchair _1 Enhance physiological function such as breathing, swallowing, digestion  Simulation ideas/Equipment trials:????? State why other equipment was unsuccessful:?????   MOBILITY BASE RECOMMENDATIONS and JUSTIFICATION: MOBILITY COMPONENT JUSTIFICATION  Manufacturer: PrideModel: Jazzy [id number]?????   Size: Width 20Seat Depth 18 _2 provide transport from point A to B      _3 promote Indep mobility  _4 is not a safe, functional ambulator _5 walker or cane inadequate _6 non-standard width/depth necessary to accommodate anatomical measurement _7  ?????  _8 Manual Mobility Base _9 non-functional ambulator    _10 Scooter/POV  _11 can safely operate  _12 can safely transfer   _13 has adequate trunk stability  _14 cannot functionally propel manual w/c  _15 Power Mobility Base  _16 non-ambulatory  _17 cannot functionally propel manual wheelchair  _18  cannot functionally and safely operate scooter/POV _19 can safely operate and willing to  _20 Stroller Base _21 infant/child  _22 unable to propel manual wheelchair _23 allows for growth _24 non-functional ambulator _25 non-functional UE _26 Indep mobility is not a goal at this time  _27 Tilt  _28 Forward _29 Backward _30 Powered tilt  _31 Manual tilt  _32 change position against gravitational force on  head and shoulders  _33 change position for pressure relief/cannot weight shift _34 transfers  _35 management of tone _36 rest periods _37 control edema _38 facilitate postural control  _39  ?????  _40 Recline  _41 Power recline on power base _42 Manual recline on manual base  _43 accommodate femur to back angle  _44 bring to full recline for ADL care  _45 change position for pressure relief/cannot weight shift _46 rest periods _47 repositioning for transfers or clothing/diaper /catheter changes _48 head positioning  _49 Lighter weight required _50 self- propulsion  _51 lifting _52  ?????  _53 Heavy Duty required _54 user weight greater than 250# _55 extreme tone/ over active movement _56 broken frame on previous chair _57  ?????  _58  Back  _59  Angle Adjustable _60  Custom molded Captain's Back _61 postural control _62 control of tone/spasticity _63 accommodation of range of motion _64 UE functional control _65 accommodation for seating system _66  ????? _67 provide lateral trunk support _68 accommodate deformity _69 provide posterior trunk support _70 provide lumbar/sacral support _71 support trunk in midline _72 Pressure relief over spinal processes  _73  Seat Cushion Captain's Seat _74 impaired sensation  _75 decubitus ulcers present _76 history of pressure ulceration _77 prevent pelvic extension _78 low maintenance  _79 stabilize pelvis  _80 accommodate obliquity _81 accommodate multiple deformity _82 neutralize lower extremity position _83 increase pressure distribution _84  ?????  _85  Pelvic/thigh support  _86  Lateral thigh guide _87  Distal medial pad  _88  Distal lateral pad _89  pelvis in neutral _90 accommodate pelvis _91  position upper legs _92  alignment _93  accommodate ROM _94  decr adduction _95 accommodate tone _96 removable for transfers _97 decr abduction  _98  Lateral trunk Supports _99  Lt     _100  Rt _101 decrease lateral trunk leaning _102 control tone _103 contour for increased contact _104 safety  _105 accommodate asymmetry _106  ?????  _107  Mounting hardware  _108 lateral  trunk supports  _109 back   _110 seat _111 headrest      _112  thigh support _113 fixed   _114 swing away _115 attach seat platform/cushion to w/c frame _116 attach back cushion to w/c frame _117 mount postural supports _118 mount headrest  _119 swing medial thigh support away _120 swing lateral supports away for transfers  _121  ?????    Armrests  _122 fixed _123 adjustable height _124 removable   _125 swing away  _126 flip back   _127 reclining _128 full length pads _129 desk    _130 pads tubular  _131 provide support with elbow at 90   _132 provide support for w/c tray _133 change of height/angles for variable activities _134 remove for transfers _135 allow to come closer to table top _136 remove for access to tables _137  ?????  Hangers/ Leg rests  _138 60 _139 70 _140 90 _141 elevating _142 heavy duty  _143   articulating _0 fixed _1 lift off _2 swing away     _3 power _4 provide LE support  _5 accommodate to hamstring tightness _6 elevate legs during recline   _7 provide change in position for Legs _8 Maintain placement of feet on footplate _9 durability _10 enable transfers _11 decrease edema _12 Accommodate lower leg length _13  ?????  Foot support Footplate    <PPIRJJOACZYSAYTK>_1<\/SWFUXNATFTDDUKGU>_54 Lt  _15  Rt  _16  Center mount _17 flip up     _18 depth/angle adjustable _19 Amputee adapter    _20  Lt     _21  Rt _22 provide foot support _23 accommodate to ankle ROM _24 transfers _25 Provide support for residual extremity _26  allow foot to go under wheelchair base _27  decrease tone  _28  ?????  _29  Ankle strap/heel loops _30 support foot on foot support _31 decrease extraneous movement _32 provide input to heel  _33 protect foot  Tires: _34 pneumatic  _35 flat free inserts  _36 solid  _37 decrease maintenance  _38 prevent frequent flats _39 increase shock absorbency _40 decrease pain from road shock _41 decrease spasms from road shock _42  ?????  _43  Headrest  _44 provide posterior head support _45 provide posterior neck support _46 provide lateral head support _47 provide anterior head support _48 support during tilt and recline _49 improve feeding    _50 improve respiration _51 placement of switches _52 safety  _53 accommodate ROM  _54 accommodate tone _55 improve visual orientation  _56  Anterior chest strap _57  Vest _58  Shoulder retractors  _59 decrease forward movement of shoulder _60 accommodation of TLSO _61 decrease forward movement of trunk _62 decrease shoulder elevation _63 added abdominal support _64 alignment _65 assistance with shoulder control  _66  ?????  Pelvic Positioner _67 Belt _68 SubASIS bar _69 Dual Pull _70 stabilize tone _71 decrease falling out of chair/ **will not Decr potential for sliding due to pelvic tilting _72 prevent excessive rotation _73 pad for protection over boney prominence _74 prominence comfort _75 special pull angle to control rotation _76  ?????  Upper Extremity Support _77 L   _78  R _79 Arm trough    _80 hand support _81  tray       _82 full tray _83 swivel mount _84 decrease edema      _85 decrease subluxation   _86 control tone   _87 placement for AAC/Computer/EADL _88 decrease gravitational pull on shoulders _89 provide midline positioning _90 provide support to increase UE function _91 provide hand support in natural position _92 provide work surface   POWER WHEELCHAIR CONTROLS  _93 Proportional  _94 Non-Proportional Type Joystick _95 Left  _96 Right _97 provides access for controlling wheelchair   _98 lacks motor control to operate proportional drive control <YHCWCBJSEGBTDVVO>_1<\/YWVPXTGGYIRSWNIO>_27 unable to understand proportional controls  Actuator Control Module  _100 Single  _101 Multiple   _102 Allow the client to operate the power seat function(s) through the joystick control   _103 Safety Reset Switches _104 Used to change modes and stop the wheelchair when driving in latch mode    _105 [employer]   _106 programming for accurate control _107 progressive Disease/changing condition _108 non-proportional drive control needed _109 Needed in order to operate power seat functions through joystick control   _110 Display box _111 Allows user to see in which mode and drive the wheelchair is set  _112 necessary for  alternate controls    _113 Digital interface electronics _114 Allows w/c to operate when using alternative drive controls  <OJJKKXFGHWEXHBZJ>_6<\/RCVELFYBOFBPZWCH>_852 ASL Head Array _116 Allows client to operate wheelchair  through switches placed in tri-panel headrest  _117 Sip and puff with tubing kit _118 needed to operate sip and puff drive controls  <DPOEUMPNTIRWERXV>_4<\/MGQQPYPPJKDTOIZT>_245 Upgraded tracking electronics _120 increase safety when driving <YKDXIPJASNKNLZJQ>_7<\/HALPFXTKWIOXBDZH>_299 correct tracking when on uneven surfaces  _122 [facility] for switches or joystick _123 Attaches switches to w/c  _124 Swing away for access or transfers _125 midline for optimal placement _126 provides for consistent access  _127 Attendant controlled joystick plus mount _128 safety _129 long distance driving <MEQASTMHDQQIWLNL>_8<\/XQJJHERDEYCXKGYJ>_856 operation of seat functions _131 compliance with transportation regulations _132  ?????    Rear wheel placement/Axle adjustability _133 None _134 semi adjustable _135   fully adjustable  _0 improved UE access to wheels _1 improved stability _2 changing angle in space for improvement of postural stability _3 1-arm drive access <XBMWUXLKGMWNUUVO>_5<\/DGUYQIHKVQQVZDGL>_8 amputee pad placement _5  ?????  Wheel rims/ hand rims  _6 metal  _7 plastic coated _8 oblique projections _9 vertical projections _10 Provide ability to propel manual wheelchair  _11  Increase self-propulsion with hand weakness/decreased grasp  Push handles _12 extended  _13 angle adjustable  _14 standard _15 caregiver access _16 caregiver assist _17 allows "hooking" to enable increased ability to perform ADLs or maintain balance  One armed device  _18 Lt   _19 Rt _20 enable propulsion of manual wheelchair with one arm   _21  ?????   Brake/wheel lock extension _22  Lt   _23  Rt _24 increase indep in applying wheel locks   _25 Side guards _26 prevent clothing getting caught in wheel or becoming soiled _27  prevent skin tears/abrasions  Battery: NF 22x2 _28 to power wheelchair ?????  Other: [employer] [name] To secure power wheelchair for safe transportation ?????  The above equipment has a life- long use expectancy. Growth and changes in medical and/or functional conditions would be  the exceptions. This is to certify that the therapist has no financial relationship with durable medical provider or manufacturer. The therapist will not receive remuneration of any kind for the equipment recommended in this evaluation.   Patient has mobility limitation that significantly impairs safe, timely participation in one or more mobility related ADL's.  (bathing, toileting, feeding, dressing, grooming, moving from room to room)                                                             _29  Yes _30  No Will mobility device sufficiently improve ability to participate and/or be aided in participation of MRADL's?         _31  Yes _32  No Can limitation be compensated for with use of a cane or walker?                                                                                _33  Yes _34  No Does patient or caregiver demonstrate ability/potential ability & willingness to safely use the mobility device?   _35  Yes _36  No Does patient's home environment support use of recommended mobility device?                                                    _37  Yes _38  No Does patient have sufficient upper extremity function necessary to functionally propel a manual wheelchair?    _39  Yes _40  No Does patient have sufficient strength and trunk stability to safely operate a POV (scooter)?                                  _41  Yes _42  No Does patient need additional features/benefits provided by a power wheelchair for MRADL's in the  home?       _0  Yes _1  No Does the patient demonstrate the ability to safely use a power wheelchair?                                                              _2  Yes _3  No  Therapist Name Printed: Tilda Burrow. Melrose Nakayama, PT, DPT Date: 02/29/20  Therapist's Signature:   Date:   Supplier's Name Printed: [employer] Date: 02/29/20  Supplier's Signature:   Date:  Patient/Caregiver Signature:   Date:     This is to certify that I have read this evaluation and do agree with the content  within:      Physician's Name Printed: Hoyt Koch, MD  59 Signature:  Date:     This is to certify that I, the above signed therapist have the following affiliations: _4  This DME provider _5  Manufacturer of recommended equipment _6  Patient's long term care facility _7  None of the above     Objective measurements completed on examination: See above findings.              PT Education - 02/29/20 1155    Education Details  process for obtaining a power wheelchair    Person(s) Educated  Patient    Methods  Explanation    Comprehension  Verbalized understanding                  Plan - 02/29/20 1257    Clinical Impression Statement  Pt is a 54 year old male referred to outpatient physical therapy for evaluation for a new power wheelchair.  Patient demonstrates the following impairments and functional limitations placing patient at increased falls risk: impaired and absent sensation with wounds on bilateral feet, impaired LE strength, impaired activity tolerance and endurance, impaired standing balance and inability to ambulate.  Pt would benefit from the use of a power wheelchair to maintain independent household and community mobility, to continue to participate in MRADL and decrease falls risk.    Personal Factors and Comorbidities  Comorbidity 3+;Fitness;Past/Current Experience;Social Background    Comorbidities  2 falls this year; PMH - anemia, aortic insufficiency, bilateral carpal tunnel syndrome, CHF, claustrophobia, COPD, Diabetes and diabetic peripheral neuropathy, diabetic retinopathy, lumbar discitis, DVT, endocarditis, ESRD on dialysis s/p renal transplant that failed in 2016, gastroparesis, gout, HLD, HTN, obesity, OSA, osteomyelitis, PTSD, rotator cuff disorder, hyperparathyroidism, sepsis, ulcers on bilat feet/toes    Examination-Activity Limitations  Bathing;Locomotion Level;Dressing;Hygiene/Grooming;Stand;Toileting;Transfers     Examination-Participation Restrictions  Community Activity    Stability/Clinical Decision Making  Evolving/Moderate complexity    Clinical Decision Making  Moderate    Rehab Potential  Good    PT Frequency  One time visit    PT Duration  Other (comment)   one time visit for power w/c evaluation   PT Treatment/Interventions  Other (comment)   w/c management   Consulted and Agree with Plan of Care  Patient       Patient will benefit from skilled therapeutic intervention in order to improve the following deficits and impairments:  Decreased activity tolerance, Decreased balance, Decreased endurance, Decreased strength, Impaired sensation, Pain  Visit Diagnosis: Repeated falls  Other disturbances of skin sensation  Muscle weakness (generalized)     Problem List Patient Active Problem List  Diagnosis Date Noted  . Band keratopathy of both eyes 11/10/2019  . Pseudophakia of both eyes 11/10/2019  . Routine general medical examination at a health care facility 07/21/2019  . Complication of vascular dialysis catheter 07/17/2019  . Rectal pain 04/20/2019  . Allergy, unspecified, sequela 12/29/2018  . Pain, unspecified 12/29/2018  . Leg lesion 06/16/2018  . Mild protein-calorie malnutrition ([place]) 05/09/2018  . Hyperkalemia 04/11/2018  . Hemorrhoid 02/28/2018  . Numbness 10/20/2017  . Fluid overload, unspecified 06/04/2016  . Hypocalcemia 02/20/2016  . Anterior urethral stricture 12/09/2015  . Iron deficiency anemia, unspecified 11/30/2015  . Dependence on renal dialysis ([place]) 11/27/2015  . ESRD on dialysis ([place]) 11/15/2015  . Coagulation defect, unspecified ([place]) 11/02/2015  . Acute on chronic systolic (congestive) heart failure ([place]) 10/29/2015  . CHF (congestive heart failure) ([place]) 10/07/2015  . Prolonged Q-T interval on ECG 10/07/2015  . Chronic deep vein thrombosis (DVT) of left lower extremity ([place]) 10/07/2015  . Hypomagnesemia 06/26/2015  . Aortic insufficiency  04/12/2015  . Endocarditis 12/04/2014  . Lumbar discitis 11/22/2014  . Back pain at L4-L5 level   . Anemia due to chronic kidney disease   . Cervical radiculopathy   . Diabetic peripheral neuropathy ([place]) 10/16/2014  . Hematuria 08/06/2014  . Renal transplant, status post 04/05/2014  . OSA (obstructive sleep apnea) 04/05/2014  . Severe obesity (BMI >= 40) ([place]) 04/05/2014  . Benign fibroma of prostate 02/05/2014  . Bladder neck obstruction 02/05/2014  . Visceral obesity 04/08/2012  . Male infertility 03/28/2012  . Immunosuppressive management encounter following kidney transplant 03/24/2012  . Diabetic macular edema ([place]) 01/15/2012  . Diabetes mellitus, type 2 ([place]) 01/15/2012  . IMMUNOCOMPROMISED 01/30/2010  . Diarrhea 01/30/2010  . HYPERPARATHYROIDISM, SECONDARY 01/27/2010  . GOUT 01/27/2010  . Anxiety 01/27/2010  . INSOMNIA 01/27/2010  . HLD (hyperlipidemia) 05/25/2008  . Essential hypertension 05/25/2008   Rico Junker, PT, DPT 02/29/20    1:06 PM    [facility] [address] [place], [place], [address] Phone: [phone]   Fax:  [phone]  Name: Traquan Duarte MRN: [id number] Date of Birth: 09-04-1966

## 2020-03-01 LAB — PROTIME-INR: INR: 2.1 — AB (ref 0.9–1.1)

## 2020-03-02 DIAGNOSIS — N186 End stage renal disease: Secondary | ICD-10-CM | POA: Diagnosis not present

## 2020-03-02 DIAGNOSIS — N2581 Secondary hyperparathyroidism of renal origin: Secondary | ICD-10-CM | POA: Diagnosis not present

## 2020-03-02 DIAGNOSIS — Z992 Dependence on renal dialysis: Secondary | ICD-10-CM | POA: Diagnosis not present

## 2020-03-05 DIAGNOSIS — N2581 Secondary hyperparathyroidism of renal origin: Secondary | ICD-10-CM | POA: Diagnosis not present

## 2020-03-05 DIAGNOSIS — Z992 Dependence on renal dialysis: Secondary | ICD-10-CM | POA: Diagnosis not present

## 2020-03-05 DIAGNOSIS — N186 End stage renal disease: Secondary | ICD-10-CM | POA: Diagnosis not present

## 2020-03-06 ENCOUNTER — Ambulatory Visit (INDEPENDENT_AMBULATORY_CARE_PROVIDER_SITE_OTHER): Payer: Medicare HMO | Admitting: General Practice

## 2020-03-06 DIAGNOSIS — Z7901 Long term (current) use of anticoagulants: Secondary | ICD-10-CM

## 2020-03-06 LAB — POCT INR: INR: 2.1 (ref 2.0–3.0)

## 2020-03-06 NOTE — Patient Instructions (Signed)
Pre visit review using our clinic review tool, if applicable. No additional management support is needed unless otherwise documented below in the visit note.  Fax received on 5/26   Continue to take 1 tablet daily except 1/2 tablet on Wednesdays.  Re-check in 1 week.  Dialysis patient.  [facility] faxes INR results to PCP.   LMOVM with dosing instructions.

## 2020-03-06 NOTE — Progress Notes (Signed)
I have reviewed the results and agree with this plan   

## 2020-03-07 ENCOUNTER — Other Ambulatory Visit: Payer: Self-pay

## 2020-03-07 DIAGNOSIS — N2581 Secondary hyperparathyroidism of renal origin: Secondary | ICD-10-CM | POA: Diagnosis not present

## 2020-03-07 DIAGNOSIS — Z992 Dependence on renal dialysis: Secondary | ICD-10-CM | POA: Diagnosis not present

## 2020-03-07 DIAGNOSIS — N186 End stage renal disease: Secondary | ICD-10-CM | POA: Diagnosis not present

## 2020-03-08 ENCOUNTER — Other Ambulatory Visit: Payer: Self-pay

## 2020-03-08 ENCOUNTER — Encounter: Payer: Self-pay | Admitting: Endocrinology

## 2020-03-08 ENCOUNTER — Ambulatory Visit (INDEPENDENT_AMBULATORY_CARE_PROVIDER_SITE_OTHER): Payer: Medicare HMO | Admitting: Endocrinology

## 2020-03-08 VITALS — BP 140/70 | HR 76 | Ht 70.0 in | Wt 286.0 lb

## 2020-03-08 DIAGNOSIS — Z794 Long term (current) use of insulin: Secondary | ICD-10-CM | POA: Diagnosis not present

## 2020-03-08 DIAGNOSIS — E782 Mixed hyperlipidemia: Secondary | ICD-10-CM

## 2020-03-08 DIAGNOSIS — E1165 Type 2 diabetes mellitus with hyperglycemia: Secondary | ICD-10-CM | POA: Diagnosis not present

## 2020-03-08 MED ORDER — TOUJEO MAX SOLOSTAR 300 UNIT/ML ~~LOC~~ SOPN: PEN_INJECTOR | SUBCUTANEOUS | 1 refills | Status: DC

## 2020-03-08 MED ORDER — ACCU-CHEK GUIDE VI STRP: 1.0000 | ORAL_STRIP | 1 refills | Status: DC | PRN

## 2020-03-08 MED ORDER — HUMULIN R U-500 KWIKPEN 500 UNIT/ML ~~LOC~~ SOPN: PEN_INJECTOR | SUBCUTANEOUS | 1 refills | Status: DC

## 2020-03-08 NOTE — Patient Instructions (Addendum)
Check blood sugars on waking up 4 days a week  Also check blood sugars about 2 hours after meals and do this after different meals by rotation  Recommended blood sugar levels on waking up are 90-130 and about 2 hours after meal is 130-160  Please bring your blood sugar monitor to each visit, thank you  Take 50 units in am if eating bagel or bread

## 2020-03-08 NOTE — Progress Notes (Signed)
Patient ID: [name] [name], male   DOB: April 19, 1966, 54 y.o.   MRN: [id number]           Reason for Appointment: Follow-up for Type 2 Diabetes  History of Present Illness:          Date of diagnosis of type 2 diabetes mellitus : 1990       Background history:  He has been taking insulin since about 1999, previously was on unknown oral agents He had good control with insulin initially but A1c was much higher in 2008 and subsequently has had required larger doses of insulin He has been on Lantus for the last few years along with Novolog,followed by an endocrinologist since 2008 His A1c was 6.3 in 04/2014   Prior to his consultation he was on a regimen of Novolog , 50 units 5 times a day with each meal and snack along with 90 Units 3 times a day of Lantus   Recent history:   INSULIN regimen is :TOUJEO 80 units a.m.--70 units evening daily  Humulin R U-500 insulin with KwikPen 50 units before breakfast, 70 before lunch and 60 before dinner  Previous Sliding scale use for high sugars: Sugar 150-199 take 2 units, 200-250= 4 units and over 250 take 6 units   Most recently A1C is 7.2, previously same also   Current blood sugar patterns and problems identified:   He did not come for follow-up after his A1c was checked in April  Although he is checking his blood sugars with the new Accu-Chek he says he is getting error messages and he does not appear to be turning on when he is inserting the strips  Although he was having a few more normal readings and a couple of low sugars about 3 to 4 weeks ago in the mornings he is now getting markedly variable readings without hypoglycemia  Not clear why he does not check his sugars after meals  He is also concerned about his blood sugars being high and today a few hours after his breakfast with half a bagel his blood sugar is still over 200 despite taking 40 units of the regular insulin  He is reporting fairly consistent compliance with taking his  regular insulin before each meal  He may take 60-65 units at suppertime  However may not always get protein in the morning at breakfast and sometimes eats only cookies in the morning  He also thinks that he is not getting the full 90-day supply of insulin from his mail order supply   Dinner 5-6 pm   Compliance with the medical regimen: Inconsistent   Glucose monitoring:  done 0-2 times a day         Glucometer: Accu-Chek       Blood Glucose readings    PRE-MEAL Fasting Lunch Dinner  overnight  Overall  Glucose range:  61-380   57, 100   Mean/median:  166     154   POST-MEAL PC Breakfast PC Lunch PC Dinner  Glucose range:  ? ??  Mean/median:        Previous readings:   PRE-MEAL Fasting Lunch Dinner Bedtime Overall  Glucose range:  78-258  138-298  168-298 ?   Mean/median:     ?      Self-care: The diet that the patient has been following is: tries to limit high-fat meals, usually eating  2-3 meals a day.  Dietician visit, most recent:2013               Exercise:  unable to do any, In wheelchair  Weight history:  Wt Readings from Last 3 Encounters:  03/08/20 286 lb (129.7 kg)  12/08/19 281 lb 4.9 oz (127.6 kg)  11/15/19 286 lb (129.7 kg)    Glycemic control:   Lab Results  Component Value Date   HGBA1C 7.6 (H) 01/26/2020   HGBA1C 7.2 (H) 07/21/2019   HGBA1C 7.2 (H) 04/17/2019   Lab Results  Component Value Date   MICROALBUR 232.9 (H) 06/19/2015   LDLCALC 36 07/21/2019   CREATININE 14.58 (HH) 10/20/2019    Lab Results  Component Value Date   FRUCTOSAMINE 387 (H) 01/26/2020   FRUCTOSAMINE 352 (H) 09/12/2018   FRUCTOSAMINE 408 (H) 07/23/2017      Allergies as of 03/08/2020      Reactions   Pork-derived Products Anaphylaxis, Other (See Comments)   Fever also    Ace Inhibitors Cough   Hydrocodone Other (See Comments)   Hallucinations   Pork Allergy    Other reaction(s): Unknown      Medication List       Accurate as  of Mar 08, 2020  2:43 PM. If you have any questions, ask your nurse or doctor.        STOP taking these medications   Ozempic (0.25 or 0.5 MG/DOSE) 2 MG/1.5ML Sopn Generic drug: Semaglutide(0.25 or 0.5MG/DOS) Stopped by: Elayne Snare, MD     TAKE these medications   Accu-Chek FastClix Lancets Misc USE TO CHECK BLOOD SUGARS 4 TIMES A DAY.   Accu-Chek Guide test strip Generic drug: glucose blood 1 each by Other route as needed for other. Use as instructed to check blood sugar 3 times daily. DX:E11.65 What changed: additional instructions Changed by: Jayme Cloud, LPN   allopurinol 314 MG tablet Commonly known as: ZYLOPRIM Take 1 tablet (100 mg total) by mouth 3 (three) times a week. After dialysis   Auryxia 1 GM 210 MG(Fe) tablet Generic drug: ferric citrate   busPIRone 5 MG tablet Commonly known as: BUSPAR TAKE 1 TABLET (5 MG TOTAL) BY MOUTH 2 (TWO) TIMES DAILY AS NEEDED.   calcium acetate 667 MG capsule Commonly known as: PHOSLO Take 3 capsules (2,001 mg total) by mouth 3 (three) times daily with meals.   clonazePAM 0.5 MG tablet Commonly known as: KLONOPIN Take 0.5 mg by mouth 2 (two) times daily as needed for anxiety.   Droplet Pen Needles 32G X 4 MM Misc Generic drug: Insulin Pen Needle USE  TO INJECT FIVE TIMES DAILY AS DIRECTED   Insulin Pen Needle 32G X 8 MM Misc Use with insulin x5 times a day   heparin sodium (porcine) 1000 UNIT/ML injection Heparin Sodium (Porcine) 1,000 Units/mL Catheter Lock Venous   HumuLIN R U-500 KwikPen 500 UNIT/ML kwikpen Generic drug: insulin regular human CONCENTRATED Inject 50 units under the skin at breakfast, 70 at lunch, and 60 at dinner. What changed: additional instructions Changed by: Jayme Cloud, LPN   hydrocortisone 1 % ointment Apply 1 application topically 2 (two) times daily.   hydrocortisone-pramoxine rectal foam Commonly known as: PROCTOFOAM-HC Place 1 applicator rectally 2 (two) times daily.   hydrOXYzine  25 MG tablet Commonly known as: ATARAX/VISTARIL Take 1 tablet (25 mg total) by mouth 3 (three) times daily as needed for anxiety.   Lokelma 10 g Pack packet Generic drug: sodium zirconium cyclosilicate Take by mouth.   loperamide 2  MG capsule Commonly known as: IMODIUM Take 1 capsule (2 mg total) by mouth as needed for diarrhea or loose stools.   midodrine 10 MG tablet Commonly known as: PROAMATINE Take 10 mg by mouth as needed (low blood pressure).   mupirocin ointment 2 % Commonly known as: BACTROBAN Apply 1 application topically 2 (two) times daily.   omeprazole 20 MG capsule Commonly known as: PRILOSEC Take 1 capsule (20 mg total) by mouth daily. Annual appt due in Oct must see provider for future refills   oxyCODONE-acetaminophen 5-325 MG tablet Commonly known as: PERCOCET/ROXICET Take 1 tablet by mouth every 6 (six) hours as needed.   Renvela 800 MG tablet Generic drug: sevelamer carbonate Take 2,400 mg by mouth 3 (three) times daily with meals.   rosuvastatin 20 MG tablet Commonly known as: CRESTOR Take 1 tablet (20 mg total) by mouth at bedtime. Annual appt due in Oct must see provider for future refills   Suprep Bowel Prep Kit 17.5-3.13-1.6 GM/177ML Soln Generic drug: Na Sulfate-K Sulfate-Mg Sulf   Toujeo Max SoloStar 300 UNIT/ML Solostar Pen Generic drug: insulin glargine (2 Unit Dial) INJECT 80 UNITS UNDER THE SKIN IN THE MORNING AND 80 UNITS IN THE EVENING What changed: additional instructions   Veltassa 8.4 g packet Generic drug: patiromer Take 8.4 g by mouth daily.   warfarin 5 MG tablet Commonly known as: COUMADIN Take as directed by the anticoagulation clinic. If you are unsure how to take this medication, talk to your nurse or doctor. Original instructions: Take 1 (13m) tablet by mouth daily or as directed by coumadin clinic.       Allergies:  Allergies  Allergen Reactions  . Pork-Derived Products Anaphylaxis and Other (See Comments)     Fever also   . Ace Inhibitors Cough  . Hydrocodone Other (See Comments)    Hallucinations  . Pork Allergy     Other reaction(s): Unknown    Past Medical History:  Diagnosis Date  . Allergic rhinitis   . Allergy   . Anemia   . Aortic insufficiency    a. Echo 7/16:  Mod LVH, EF 50-55%, mild to mod AI, severe LAE, PASP 57 mmHg (LV ID end diastolic 565.6mm);  b. Echo 2/17:  Mod LVH, EF 50-55%, mod AI, MAC, mild MR, mod LAE, mild to mod TR, PASP 63 mmHg  . Bilateral carpal tunnel syndrome   . Cataract    removed both eyes   . CHF (congestive heart failure) ([facility]   . Claustrophobia   . COPD (chronic obstructive pulmonary disease) ([facility]   . Diabetes mellitus    Type II  . Diabetic peripheral neuropathy ([facility]   . Diabetic retinopathy ([facility] [facility]   . Discitis of lumbar region    resolved  . DVT (deep venous thrombosis) ([facility] 10/07/2015   LLE  . Endocarditis 11/28/14  . ESRD (end stage renal disease) on dialysis ([facility]    s/p renal transplant in 2009, failed in 2016  . Gait disorder   . Gastroparesis   . GERD (gastroesophageal reflux disease)   . Gout   . Hearing difficulty   . Hemodialysis patient ([facility]    tuesday, thursday, saturday   . Hyperlipidemia   . Hypertension   . Incomplete bladder emptying   . Leg pain 02/05/11   with walking  . Morbid obesity ([facility]   . Obstructive sleep apnea    not using CPAP- lost 100lbs  . Osteomyelitis ([facility]   . Posttraumatic stress disorder   .  Rotator cuff disorder   . Secondary hyperparathyroidism ([place])   . Sepsis ([place])    Postive blood cultures -   . Sleep apnea    no cpap  . Urethral stricture   . Vitamin D deficiency     Past Surgical History:  Procedure Laterality Date  . ARTERIOVENOUS GRAFT PLACEMENT Bilateral "several"  . AV FISTULA PLACEMENT Bilateral 2015  . AV FISTULA PLACEMENT Right 06/13/2019   Procedure: INSERTION OF ARTERIOVENOUS (AV) GORE-TEX GRAFT RIGHT  ARM;  Surgeon: Elam Dutch, MD;  Location: [place];  Service:  Vascular;  Laterality: Right;  . BASCILIC VEIN TRANSPOSITION Right 09/27/2015  . [place] Right 09/27/2015   Procedure: BASILIC VEIN TRANSPOSITION right;  Surgeon: Mal Misty, MD;  Location: [place];  Service: Vascular;  Laterality: Right;  . CATARACT EXTRACTION W/ INTRAOCULAR LENS  IMPLANT, BILATERAL Bilateral   . CYSTOSCOPY W/ INTERNAL URETHROTOMY  09/2014  . IR THROMBECTOMY AV FISTULA W/THROMBOLYSIS/PTA INC/SHUNT/IMG RIGHT Right 08/01/2019  . IR US GUIDE VASC ACCESS RIGHT  08/01/2019  . KIDNEY TRANSPLANT  2009  . SIGMOIDOSCOPY    . UPPER EXTREMITY VENOGRAPHY Bilateral 06/02/2019   Procedure: UPPER EXTREMITY VENOGRAPHY;  Surgeon: Elam Dutch, MD;  Location: [place] CV LAB;  Service: Cardiovascular;  Laterality: Bilateral;  . UPPER GASTROINTESTINAL ENDOSCOPY      Family History  Problem Relation Age of Onset  . Hypertension Mother   . Diabetes Mother   . Diabetes Father   . Diabetes Brother   . Colon cancer Neg Hx   . Colon polyps Neg Hx   . Esophageal cancer Neg Hx   . Rectal cancer Neg Hx   . Stomach cancer Neg Hx     Social History:  reports that he has never smoked. He has never used smokeless tobacco. He reports that he does not drink alcohol or use drugs.    Review of Systems    ROS   Lipid history: He is on 20 mg Crestor with the following labs    Lab Results  Component Value Date   CHOL 93 07/21/2019   HDL 28.00 (L) 07/21/2019   LDLCALC 36 07/21/2019   TRIG 146.0 07/21/2019   CHOLHDL 3 07/21/2019            Hypertension: His blood pressure is managed by nephrologist and PCP   RENAL: He has had a renal transplant in 2009 and  he is on dialysis  Neurological: Has has had  decreased sensation in his feet, also complaining of tingling and burning Previously followed by neurologist  He has had severe problems with balance and also some weakness in his legs, using a power scooter  Last foot exam showed: Absent monofilament  sensation in his feet and toes and extending up to the middle of the lower leg on the left and up to the ankle on the right  LABS:  Anti-coag visit on 03/06/2020  Component Date Value Ref Range Status  . INR 03/06/2020 2.1  2.0 - 3.0 Final    Physical Examination:  BP 140/70 (BP Location: Left Arm, Patient Position: Sitting, Cuff Size: Normal)   Pulse 76   Ht _0  (1.778 m)   Wt 286 lb (129.7 kg)   SpO2 99%   BMI 41.04 kg/m      ASSESSMENT:  Diabetes type 2, long-standing with obesity  See history of present illness for detailed discussion of his current management, blood sugar patterns and problems identified.  His A1c  was slightly higher at 7.6 last month Recent fructosamine not available  He only has fasting blood sugars at home recently and these are quite variable and cannot explain why his sugars can be so labile in the morning including readings in the 60s or nearly 400 at times Diet may be variable However he thinks he is compliant with his insulin doses as directed although he thinks he does not get his full 90-day supply from mail order company  HYPERCHOLESTEROLEMIA: Will need follow-up labs on the next visit   PLAN:  He was given a new Accu-Chek meter Discussed the need to check blood sugars by rotation at all different times of the day especially after dinner Explained the blood sugar targets both fasting and after meals This will help adjust his multiple insulin dose regimen If he is eating more carbohydrates like bagels in the morning he will take 50 units of regular insulin instead of 40 Insulin doses will be further adjusted based on his blood sugar patterns with more monitoring If he continues to have low sugars late at night or overnight may reduce his suppertime dose but potentially increase his Toujeo Order consultation with dietitian for meal planning, need to assess if he can do better with his diet to keep his blood sugars from being so  labile  New prescriptions for all his insulin doses and test strips was sent He is a good candidate for the freestyle libre but he does need to monitor more frequently to qualify   Patient Instructions  Check blood sugars on waking up 4 days a week  Also check blood sugars about 2 hours after meals and do this after different meals by rotation  Recommended blood sugar levels on waking up are 90-130 and about 2 hours after meal is 130-160  Please bring your blood sugar monitor to each visit, thank you  Take 50 units in am if eating bagel or bread         Elayne Snare 03/08/2020, 2:43 PM   Note: This office note was prepared with Dragon voice recognition system technology. Any transcriptional errors that result from this process are unintentional.

## 2020-03-09 DIAGNOSIS — Z992 Dependence on renal dialysis: Secondary | ICD-10-CM | POA: Diagnosis not present

## 2020-03-09 DIAGNOSIS — N186 End stage renal disease: Secondary | ICD-10-CM | POA: Diagnosis not present

## 2020-03-09 DIAGNOSIS — N2581 Secondary hyperparathyroidism of renal origin: Secondary | ICD-10-CM | POA: Diagnosis not present

## 2020-03-11 DIAGNOSIS — E1122 Type 2 diabetes mellitus with diabetic chronic kidney disease: Secondary | ICD-10-CM | POA: Diagnosis not present

## 2020-03-11 DIAGNOSIS — Z992 Dependence on renal dialysis: Secondary | ICD-10-CM | POA: Diagnosis not present

## 2020-03-11 DIAGNOSIS — N186 End stage renal disease: Secondary | ICD-10-CM | POA: Diagnosis not present

## 2020-03-12 DIAGNOSIS — Z992 Dependence on renal dialysis: Secondary | ICD-10-CM | POA: Diagnosis not present

## 2020-03-12 DIAGNOSIS — N186 End stage renal disease: Secondary | ICD-10-CM | POA: Diagnosis not present

## 2020-03-12 DIAGNOSIS — N2581 Secondary hyperparathyroidism of renal origin: Secondary | ICD-10-CM | POA: Diagnosis not present

## 2020-03-14 ENCOUNTER — Ambulatory Visit: Payer: Medicare HMO | Admitting: Podiatry

## 2020-03-14 DIAGNOSIS — Z992 Dependence on renal dialysis: Secondary | ICD-10-CM | POA: Diagnosis not present

## 2020-03-14 DIAGNOSIS — N2581 Secondary hyperparathyroidism of renal origin: Secondary | ICD-10-CM | POA: Diagnosis not present

## 2020-03-14 DIAGNOSIS — N186 End stage renal disease: Secondary | ICD-10-CM | POA: Diagnosis not present

## 2020-03-16 ENCOUNTER — Other Ambulatory Visit: Payer: Self-pay

## 2020-03-16 ENCOUNTER — Ambulatory Visit (INDEPENDENT_AMBULATORY_CARE_PROVIDER_SITE_OTHER): Payer: Medicare HMO | Admitting: Podiatry

## 2020-03-16 ENCOUNTER — Encounter: Payer: Self-pay | Admitting: Podiatry

## 2020-03-16 DIAGNOSIS — N2581 Secondary hyperparathyroidism of renal origin: Secondary | ICD-10-CM | POA: Diagnosis not present

## 2020-03-16 DIAGNOSIS — B351 Tinea unguium: Secondary | ICD-10-CM | POA: Diagnosis not present

## 2020-03-16 DIAGNOSIS — Z7901 Long term (current) use of anticoagulants: Secondary | ICD-10-CM

## 2020-03-16 DIAGNOSIS — E1149 Type 2 diabetes mellitus with other diabetic neurological complication: Secondary | ICD-10-CM

## 2020-03-16 DIAGNOSIS — L84 Corns and callosities: Secondary | ICD-10-CM

## 2020-03-16 DIAGNOSIS — N186 End stage renal disease: Secondary | ICD-10-CM | POA: Diagnosis not present

## 2020-03-16 DIAGNOSIS — Z992 Dependence on renal dialysis: Secondary | ICD-10-CM | POA: Diagnosis not present

## 2020-03-16 NOTE — Progress Notes (Signed)
Subjective: 54 year old male presents the office today for concerns of dry hard skin on the bottoms of both of his big toes.  He states that his CNA has noticed this but denies any drainage or swelling or any redness.  Nails are also elongated discussed irritation inside shoes.  No redness or drainage of the toenail sites. Denies any systemic complaints such as fevers, chills, nausea, vomiting. No acute changes since last appointment, and no other complaints at this time.   He is on Coumadin   Objective: AAO x3, NAD DP/PT pulses palpable bilaterally, CRT less than 3 seconds Sensation decreased with Semmes Weinstein monofilament Hyperkeratotic lesions bilateral plantar hallux with dried blood but upon debridement there is no underlying ulceration drainage or any signs of infection. Nails are hypertrophic, dystrophic, brittle, discolored, elongated 10. No surrounding redness or drainage. Tenderness nails 1-5 bilaterally. No open lesions or pre-ulcerative lesions are identified today.  No pain with calf compression, swelling, warmth, erythema        Assessment: Symptomatic onychomycosis, preulcerative calluses; on Coumadin  Plan: -All treatment options discussed with the patient including all alternatives, risks, complications.  -Nails debris x10 without any complications or bleeding -Hyperkeratotic lesion sharp debrided x2 without any complications or bleeding.  Recommend offloading and moisturizer daily. -Daily foot inspection. -Patient encouraged to call the office with any questions, concerns, change in symptoms.   RTC 3 months or sooner if needed  Trula Slade DPM

## 2020-03-19 DIAGNOSIS — Z992 Dependence on renal dialysis: Secondary | ICD-10-CM | POA: Diagnosis not present

## 2020-03-19 DIAGNOSIS — N2581 Secondary hyperparathyroidism of renal origin: Secondary | ICD-10-CM | POA: Diagnosis not present

## 2020-03-19 DIAGNOSIS — N186 End stage renal disease: Secondary | ICD-10-CM | POA: Diagnosis not present

## 2020-03-21 DIAGNOSIS — N2581 Secondary hyperparathyroidism of renal origin: Secondary | ICD-10-CM | POA: Diagnosis not present

## 2020-03-21 DIAGNOSIS — Z992 Dependence on renal dialysis: Secondary | ICD-10-CM | POA: Diagnosis not present

## 2020-03-21 DIAGNOSIS — N186 End stage renal disease: Secondary | ICD-10-CM | POA: Diagnosis not present

## 2020-03-22 ENCOUNTER — Ambulatory Visit (INDEPENDENT_AMBULATORY_CARE_PROVIDER_SITE_OTHER): Payer: Medicare HMO | Admitting: Internal Medicine

## 2020-03-22 ENCOUNTER — Encounter: Payer: Self-pay | Admitting: Internal Medicine

## 2020-03-22 ENCOUNTER — Other Ambulatory Visit: Payer: Self-pay

## 2020-03-22 LAB — POCT INR: INR: 1.7 — AB (ref 2.0–3.0)

## 2020-03-22 NOTE — Progress Notes (Signed)
   Subjective:   Patient ID: [name] [name], male    DOB: [birth year], 54 y.o.   MRN: [id number]  HPI The patient is a 54 YO man coming in for concerns about not being able to be assessed for renal transplant. He has gotten letter from transplant center that they will not assess him due to midodrine and repeated falls. He recently had evaluation for wheelchair with PT and did admit that his scooter does fall over and trip multiple times. This is related to his weight being too much for the scooter and this is not appropriate medical device for him and this causes it to be unstable for usage and likely to tip over per PT note. He is able to transfer and walk short distances without falls and is able to live independently.   Review of Systems  Constitutional: Negative.   HENT: Negative.   Eyes: Negative.   Respiratory: Negative for cough, chest tightness and shortness of breath.   Cardiovascular: Negative for chest pain, palpitations and leg swelling.  Gastrointestinal: Negative for abdominal distention, abdominal pain, constipation, diarrhea, nausea and vomiting.  Musculoskeletal: Positive for gait problem.  Skin: Negative.   Psychiatric/Behavioral: Negative.     Objective:  Physical Exam Constitutional:      Appearance: He is well-developed.  HENT:     Head: Normocephalic and atraumatic.  Cardiovascular:     Rate and Rhythm: Normal rate and regular rhythm.  Pulmonary:     Effort: Pulmonary effort is normal. No respiratory distress.     Breath sounds: Normal breath sounds. No wheezing or rales.  Abdominal:     General: Bowel sounds are normal. There is no distension.     Palpations: Abdomen is soft.     Tenderness: There is no abdominal tenderness. There is no rebound.  Musculoskeletal:     Cervical back: Normal range of motion.  Skin:    General: Skin is warm and dry.  Neurological:     Mental Status: He is alert and oriented to person, place, and time.     Coordination:  Coordination normal.     Comments: Using scooter during visit     Vitals:   03/22/20 1351  BP: 140/60  Pulse: 100  Temp: 98.4 F (36.9 C)  TempSrc: Oral  SpO2: 90%  Weight: 285 lb (129.3 kg)  Height: 5\' 10"  (1.778 m)    This visit occurred during the SARS-CoV-2 public health emergency.  Safety protocols were in place, including screening questions prior to the visit, additional usage of staff PPE, and extensive cleaning of exam room while observing appropriate contact time as indicated for disinfecting solutions.   Assessment & Plan:  Visit time 25 minutes in face to face communication with patient and coordination of care, additional 10 minutes spent in record review, coordination or care, ordering tests, communicating/referring to other healthcare professionals, documenting in medical records all on the same day of the visit for total time 35 minutes spent on the visit.

## 2020-03-22 NOTE — Patient Instructions (Signed)
We will get a letter done for the transplant doctor and send it to you and them

## 2020-03-22 NOTE — Assessment & Plan Note (Signed)
Needs mobility device and letter done he can give to transplant team to assist in his desire to be evaluated for repeat renal transplant.

## 2020-03-23 DIAGNOSIS — N2581 Secondary hyperparathyroidism of renal origin: Secondary | ICD-10-CM | POA: Diagnosis not present

## 2020-03-23 DIAGNOSIS — N186 End stage renal disease: Secondary | ICD-10-CM | POA: Diagnosis not present

## 2020-03-23 DIAGNOSIS — Z992 Dependence on renal dialysis: Secondary | ICD-10-CM | POA: Diagnosis not present

## 2020-03-25 ENCOUNTER — Ambulatory Visit (INDEPENDENT_AMBULATORY_CARE_PROVIDER_SITE_OTHER): Payer: Medicare HMO | Admitting: General Practice

## 2020-03-25 DIAGNOSIS — Z7901 Long term (current) use of anticoagulants: Secondary | ICD-10-CM | POA: Diagnosis not present

## 2020-03-25 NOTE — Patient Instructions (Addendum)
.  Pre visit review using our clinic review tool, if applicable. No additional management support is needed unless otherwise documented below in the visit note.  Fax received on 6/14.  Please take 1 1/2 tablets today and then change dosage and take 1 tablet daily.   Re-check in 1 week.  Dialysis patient.  [facility] faxes INR results to [facility].  Patient was given dosing instructions and he did verbalize understanding.

## 2020-03-26 DIAGNOSIS — N186 End stage renal disease: Secondary | ICD-10-CM | POA: Diagnosis not present

## 2020-03-26 DIAGNOSIS — Z992 Dependence on renal dialysis: Secondary | ICD-10-CM | POA: Diagnosis not present

## 2020-03-26 DIAGNOSIS — N2581 Secondary hyperparathyroidism of renal origin: Secondary | ICD-10-CM | POA: Diagnosis not present

## 2020-03-28 DIAGNOSIS — Z992 Dependence on renal dialysis: Secondary | ICD-10-CM | POA: Diagnosis not present

## 2020-03-28 DIAGNOSIS — N186 End stage renal disease: Secondary | ICD-10-CM | POA: Diagnosis not present

## 2020-03-28 DIAGNOSIS — N2581 Secondary hyperparathyroidism of renal origin: Secondary | ICD-10-CM | POA: Diagnosis not present

## 2020-03-29 LAB — POCT INR: INR: 2.5 (ref 2.0–3.0)

## 2020-03-30 DIAGNOSIS — N186 End stage renal disease: Secondary | ICD-10-CM | POA: Diagnosis not present

## 2020-03-30 DIAGNOSIS — N2581 Secondary hyperparathyroidism of renal origin: Secondary | ICD-10-CM | POA: Diagnosis not present

## 2020-03-30 DIAGNOSIS — Z992 Dependence on renal dialysis: Secondary | ICD-10-CM | POA: Diagnosis not present

## 2020-04-01 ENCOUNTER — Ambulatory Visit (INDEPENDENT_AMBULATORY_CARE_PROVIDER_SITE_OTHER): Payer: Medicare HMO | Admitting: General Practice

## 2020-04-01 DIAGNOSIS — Z7901 Long term (current) use of anticoagulants: Secondary | ICD-10-CM

## 2020-04-01 DIAGNOSIS — I82502 Chronic embolism and thrombosis of unspecified deep veins of left lower extremity: Secondary | ICD-10-CM | POA: Diagnosis not present

## 2020-04-01 NOTE — Patient Instructions (Signed)
Pre visit review using our clinic review tool, if applicable. No additional management support is needed unless otherwise documented below in the visit note.  Fax received on 6/21.  Continue to take 1 tablet daily.   Re-check in 1 week.  Dialysis patient.  [facility] faxes INR results to [facility].  Patient was given dosing instructions and he did verbalize understanding.

## 2020-04-02 DIAGNOSIS — Z992 Dependence on renal dialysis: Secondary | ICD-10-CM | POA: Diagnosis not present

## 2020-04-02 DIAGNOSIS — N186 End stage renal disease: Secondary | ICD-10-CM | POA: Diagnosis not present

## 2020-04-02 DIAGNOSIS — N2581 Secondary hyperparathyroidism of renal origin: Secondary | ICD-10-CM | POA: Diagnosis not present

## 2020-04-04 ENCOUNTER — Other Ambulatory Visit: Payer: Self-pay | Admitting: Internal Medicine

## 2020-04-04 DIAGNOSIS — N2581 Secondary hyperparathyroidism of renal origin: Secondary | ICD-10-CM | POA: Diagnosis not present

## 2020-04-04 DIAGNOSIS — N186 End stage renal disease: Secondary | ICD-10-CM | POA: Diagnosis not present

## 2020-04-04 DIAGNOSIS — Z992 Dependence on renal dialysis: Secondary | ICD-10-CM | POA: Diagnosis not present

## 2020-04-06 DIAGNOSIS — Z992 Dependence on renal dialysis: Secondary | ICD-10-CM | POA: Diagnosis not present

## 2020-04-06 DIAGNOSIS — N2581 Secondary hyperparathyroidism of renal origin: Secondary | ICD-10-CM | POA: Diagnosis not present

## 2020-04-06 DIAGNOSIS — N186 End stage renal disease: Secondary | ICD-10-CM | POA: Diagnosis not present

## 2020-04-09 DIAGNOSIS — Z992 Dependence on renal dialysis: Secondary | ICD-10-CM | POA: Diagnosis not present

## 2020-04-09 DIAGNOSIS — N2581 Secondary hyperparathyroidism of renal origin: Secondary | ICD-10-CM | POA: Diagnosis not present

## 2020-04-09 DIAGNOSIS — N186 End stage renal disease: Secondary | ICD-10-CM | POA: Diagnosis not present

## 2020-04-10 DIAGNOSIS — Z992 Dependence on renal dialysis: Secondary | ICD-10-CM | POA: Diagnosis not present

## 2020-04-10 DIAGNOSIS — N186 End stage renal disease: Secondary | ICD-10-CM | POA: Diagnosis not present

## 2020-04-10 DIAGNOSIS — E1122 Type 2 diabetes mellitus with diabetic chronic kidney disease: Secondary | ICD-10-CM | POA: Diagnosis not present

## 2020-04-11 DIAGNOSIS — N2581 Secondary hyperparathyroidism of renal origin: Secondary | ICD-10-CM | POA: Diagnosis not present

## 2020-04-11 DIAGNOSIS — Z992 Dependence on renal dialysis: Secondary | ICD-10-CM | POA: Diagnosis not present

## 2020-04-11 DIAGNOSIS — N186 End stage renal disease: Secondary | ICD-10-CM | POA: Diagnosis not present

## 2020-04-13 DIAGNOSIS — N2581 Secondary hyperparathyroidism of renal origin: Secondary | ICD-10-CM | POA: Diagnosis not present

## 2020-04-13 DIAGNOSIS — N186 End stage renal disease: Secondary | ICD-10-CM | POA: Diagnosis not present

## 2020-04-13 DIAGNOSIS — Z992 Dependence on renal dialysis: Secondary | ICD-10-CM | POA: Diagnosis not present

## 2020-04-16 DIAGNOSIS — Z992 Dependence on renal dialysis: Secondary | ICD-10-CM | POA: Diagnosis not present

## 2020-04-16 DIAGNOSIS — N2581 Secondary hyperparathyroidism of renal origin: Secondary | ICD-10-CM | POA: Diagnosis not present

## 2020-04-16 DIAGNOSIS — N186 End stage renal disease: Secondary | ICD-10-CM | POA: Diagnosis not present

## 2020-04-18 DIAGNOSIS — Z992 Dependence on renal dialysis: Secondary | ICD-10-CM | POA: Diagnosis not present

## 2020-04-18 DIAGNOSIS — N2581 Secondary hyperparathyroidism of renal origin: Secondary | ICD-10-CM | POA: Diagnosis not present

## 2020-04-18 DIAGNOSIS — N186 End stage renal disease: Secondary | ICD-10-CM | POA: Diagnosis not present

## 2020-04-20 DIAGNOSIS — N2581 Secondary hyperparathyroidism of renal origin: Secondary | ICD-10-CM | POA: Diagnosis not present

## 2020-04-20 DIAGNOSIS — N186 End stage renal disease: Secondary | ICD-10-CM | POA: Diagnosis not present

## 2020-04-20 DIAGNOSIS — Z992 Dependence on renal dialysis: Secondary | ICD-10-CM | POA: Diagnosis not present

## 2020-04-23 DIAGNOSIS — N186 End stage renal disease: Secondary | ICD-10-CM | POA: Diagnosis not present

## 2020-04-23 DIAGNOSIS — N2581 Secondary hyperparathyroidism of renal origin: Secondary | ICD-10-CM | POA: Diagnosis not present

## 2020-04-23 DIAGNOSIS — Z992 Dependence on renal dialysis: Secondary | ICD-10-CM | POA: Diagnosis not present

## 2020-04-25 DIAGNOSIS — Z992 Dependence on renal dialysis: Secondary | ICD-10-CM | POA: Diagnosis not present

## 2020-04-25 DIAGNOSIS — N2581 Secondary hyperparathyroidism of renal origin: Secondary | ICD-10-CM | POA: Diagnosis not present

## 2020-04-25 DIAGNOSIS — N186 End stage renal disease: Secondary | ICD-10-CM | POA: Diagnosis not present

## 2020-04-26 LAB — POCT INR: INR: 2.5 (ref 2.0–3.0)

## 2020-04-27 DIAGNOSIS — N186 End stage renal disease: Secondary | ICD-10-CM | POA: Diagnosis not present

## 2020-04-27 DIAGNOSIS — Z992 Dependence on renal dialysis: Secondary | ICD-10-CM | POA: Diagnosis not present

## 2020-04-27 DIAGNOSIS — N2581 Secondary hyperparathyroidism of renal origin: Secondary | ICD-10-CM | POA: Diagnosis not present

## 2020-04-30 ENCOUNTER — Ambulatory Visit (INDEPENDENT_AMBULATORY_CARE_PROVIDER_SITE_OTHER): Payer: Medicare HMO | Admitting: General Practice

## 2020-04-30 DIAGNOSIS — Z7901 Long term (current) use of anticoagulants: Secondary | ICD-10-CM

## 2020-04-30 DIAGNOSIS — N186 End stage renal disease: Secondary | ICD-10-CM | POA: Diagnosis not present

## 2020-04-30 DIAGNOSIS — N2581 Secondary hyperparathyroidism of renal origin: Secondary | ICD-10-CM | POA: Diagnosis not present

## 2020-04-30 DIAGNOSIS — Z992 Dependence on renal dialysis: Secondary | ICD-10-CM | POA: Diagnosis not present

## 2020-04-30 NOTE — Patient Instructions (Signed)
Pre visit review using our clinic review tool, if applicable. No additional management support is needed unless otherwise documented below in the visit note.  Fax received on 7/20.  Continue to take 1 tablet daily.   Re-check in 1 week.  Dialysis patient.  [facility] faxes INR results to [facility].  Patient was given dosing instructions and he did verbalize understanding.

## 2020-04-30 NOTE — Progress Notes (Signed)
Medical screening examination/treatment/procedure(s) were performed by non-physician practitioner and as supervising physician I was immediately available for consultation/collaboration. I agree with above. Remiel Corti, MD   

## 2020-05-02 ENCOUNTER — Telehealth: Payer: Self-pay

## 2020-05-02 DIAGNOSIS — N186 End stage renal disease: Secondary | ICD-10-CM | POA: Diagnosis not present

## 2020-05-02 DIAGNOSIS — Z992 Dependence on renal dialysis: Secondary | ICD-10-CM | POA: Diagnosis not present

## 2020-05-02 DIAGNOSIS — N2581 Secondary hyperparathyroidism of renal origin: Secondary | ICD-10-CM | POA: Diagnosis not present

## 2020-05-02 NOTE — Telephone Encounter (Signed)
I have refaxed the signature page and detailed product description page as requested for power chair

## 2020-05-02 NOTE — Telephone Encounter (Signed)
New message    Debbie from [employer] returning your call back from today.

## 2020-05-03 ENCOUNTER — Telehealth: Payer: Self-pay

## 2020-05-03 LAB — POCT INR: INR: 4.2 — AB (ref 2.0–3.0)

## 2020-05-03 NOTE — Telephone Encounter (Signed)
New message    The patient is asking for status of paperwork aware that Dr. Sharlet Salina is on vacation will return on Monday

## 2020-05-04 DIAGNOSIS — Z992 Dependence on renal dialysis: Secondary | ICD-10-CM | POA: Diagnosis not present

## 2020-05-04 DIAGNOSIS — N186 End stage renal disease: Secondary | ICD-10-CM | POA: Diagnosis not present

## 2020-05-04 DIAGNOSIS — N2581 Secondary hyperparathyroidism of renal origin: Secondary | ICD-10-CM | POA: Diagnosis not present

## 2020-05-06 ENCOUNTER — Ambulatory Visit (INDEPENDENT_AMBULATORY_CARE_PROVIDER_SITE_OTHER): Payer: Medicare HMO | Admitting: General Practice

## 2020-05-06 DIAGNOSIS — Z7901 Long term (current) use of anticoagulants: Secondary | ICD-10-CM

## 2020-05-06 LAB — POCT INR: INR: 4.2 — AB (ref 2.0–3.0)

## 2020-05-06 NOTE — Patient Instructions (Addendum)
Pre visit review using our clinic review tool, if applicable. No additional management support is needed unless otherwise documented below in the visit note.  Fax received on 7/26.  Hold coumadin today and tomorrow and then continue to take 1 tablet daily.   Re-check in 1 week.  Dialysis patient.  [facility] faxes INR results to [facility].  Dosing instructions LOVM.  INR was drawn on Friday, 8/23.

## 2020-05-07 DIAGNOSIS — Z992 Dependence on renal dialysis: Secondary | ICD-10-CM | POA: Diagnosis not present

## 2020-05-07 DIAGNOSIS — N186 End stage renal disease: Secondary | ICD-10-CM | POA: Diagnosis not present

## 2020-05-07 DIAGNOSIS — N2581 Secondary hyperparathyroidism of renal origin: Secondary | ICD-10-CM | POA: Diagnosis not present

## 2020-05-07 NOTE — Telephone Encounter (Signed)
    Debbie calling, states fax came thru blank. Please re-fax

## 2020-05-08 DIAGNOSIS — N186 End stage renal disease: Secondary | ICD-10-CM | POA: Diagnosis not present

## 2020-05-08 DIAGNOSIS — T82858A Stenosis of vascular prosthetic devices, implants and grafts, initial encounter: Secondary | ICD-10-CM | POA: Diagnosis not present

## 2020-05-08 DIAGNOSIS — Z992 Dependence on renal dialysis: Secondary | ICD-10-CM | POA: Diagnosis not present

## 2020-05-08 DIAGNOSIS — I871 Compression of vein: Secondary | ICD-10-CM | POA: Diagnosis not present

## 2020-05-08 NOTE — Telephone Encounter (Signed)
Forms have been faxed again.  

## 2020-05-09 DIAGNOSIS — Z992 Dependence on renal dialysis: Secondary | ICD-10-CM | POA: Diagnosis not present

## 2020-05-09 DIAGNOSIS — N2581 Secondary hyperparathyroidism of renal origin: Secondary | ICD-10-CM | POA: Diagnosis not present

## 2020-05-09 DIAGNOSIS — N186 End stage renal disease: Secondary | ICD-10-CM | POA: Diagnosis not present

## 2020-05-09 LAB — PROTIME-INR: INR: 1.7 — AB (ref 0.9–1.1)

## 2020-05-10 NOTE — Telephone Encounter (Signed)
The forms were placed up front yesterday after she stated that the pt offered to pick up a copy of the packet and we mutually agreed on that decision since [employer] had not received the forms via fax after multiple tries.

## 2020-05-10 NOTE — Telephone Encounter (Signed)
F/u   Debbie from [employer] did not receive the fax and wanted to confirm the paperwork was at the front desk for the patient to pick up.   The patient is coming on Monday morning to pick up the paperwork.

## 2020-05-11 DIAGNOSIS — E1129 Type 2 diabetes mellitus with other diabetic kidney complication: Secondary | ICD-10-CM | POA: Diagnosis not present

## 2020-05-11 DIAGNOSIS — N2581 Secondary hyperparathyroidism of renal origin: Secondary | ICD-10-CM | POA: Diagnosis not present

## 2020-05-11 DIAGNOSIS — N186 End stage renal disease: Secondary | ICD-10-CM | POA: Diagnosis not present

## 2020-05-11 DIAGNOSIS — Z992 Dependence on renal dialysis: Secondary | ICD-10-CM | POA: Diagnosis not present

## 2020-05-13 ENCOUNTER — Other Ambulatory Visit (INDEPENDENT_AMBULATORY_CARE_PROVIDER_SITE_OTHER): Payer: Medicare HMO

## 2020-05-13 ENCOUNTER — Other Ambulatory Visit: Payer: Self-pay

## 2020-05-13 DIAGNOSIS — E1165 Type 2 diabetes mellitus with hyperglycemia: Secondary | ICD-10-CM | POA: Diagnosis not present

## 2020-05-13 DIAGNOSIS — Z794 Long term (current) use of insulin: Secondary | ICD-10-CM

## 2020-05-13 DIAGNOSIS — E782 Mixed hyperlipidemia: Secondary | ICD-10-CM | POA: Diagnosis not present

## 2020-05-13 LAB — LIPID PANEL
Cholesterol: 93 mg/dL (ref 0–200)
HDL: 27.6 mg/dL — ABNORMAL LOW (ref >39.00)
LDL Cholesterol: 43 mg/dL (ref 0–99)
NonHDL: 65.52
Total CHOL/HDL Ratio: 3
Triglycerides: 113 mg/dL (ref 0.0–149.0)
VLDL: 22.6 mg/dL (ref 0.0–40.0)

## 2020-05-13 LAB — HEMOGLOBIN A1C: Hgb A1c MFr Bld: 7.5 % — ABNORMAL HIGH (ref 4.6–6.5)

## 2020-05-13 LAB — GLUCOSE, RANDOM: Glucose, Bld: 97 mg/dL (ref 70–99)

## 2020-05-13 LAB — ALT: ALT: 11 U/L (ref 0–53)

## 2020-05-14 DIAGNOSIS — N2581 Secondary hyperparathyroidism of renal origin: Secondary | ICD-10-CM | POA: Diagnosis not present

## 2020-05-14 DIAGNOSIS — Z992 Dependence on renal dialysis: Secondary | ICD-10-CM | POA: Diagnosis not present

## 2020-05-14 DIAGNOSIS — N186 End stage renal disease: Secondary | ICD-10-CM | POA: Diagnosis not present

## 2020-05-16 DIAGNOSIS — Z992 Dependence on renal dialysis: Secondary | ICD-10-CM | POA: Diagnosis not present

## 2020-05-16 DIAGNOSIS — N2581 Secondary hyperparathyroidism of renal origin: Secondary | ICD-10-CM | POA: Diagnosis not present

## 2020-05-16 DIAGNOSIS — N186 End stage renal disease: Secondary | ICD-10-CM | POA: Diagnosis not present

## 2020-05-16 LAB — PROTIME-INR: INR: 2.8 — AB (ref 0.9–1.1)

## 2020-05-17 ENCOUNTER — Encounter: Payer: Self-pay | Admitting: Endocrinology

## 2020-05-17 ENCOUNTER — Ambulatory Visit (INDEPENDENT_AMBULATORY_CARE_PROVIDER_SITE_OTHER): Payer: Medicare HMO | Admitting: Endocrinology

## 2020-05-17 VITALS — BP 142/64 | HR 105 | Ht 70.0 in

## 2020-05-17 DIAGNOSIS — E1165 Type 2 diabetes mellitus with hyperglycemia: Secondary | ICD-10-CM | POA: Diagnosis not present

## 2020-05-17 DIAGNOSIS — E782 Mixed hyperlipidemia: Secondary | ICD-10-CM | POA: Diagnosis not present

## 2020-05-17 DIAGNOSIS — Z794 Long term (current) use of insulin: Secondary | ICD-10-CM | POA: Diagnosis not present

## 2020-05-17 NOTE — Progress Notes (Signed)
Patient ID: [name] [name], male   DOB: 1966-06-19, 54 y.o.   MRN: [id number]           Reason for Appointment: Follow-up for Type 2 Diabetes  History of Present Illness:          Date of diagnosis of type 2 diabetes mellitus : 1990       Background history:  He has been taking insulin since about 1999, previously was on unknown oral agents He had good control with insulin initially but A1c was much higher in 2008 and subsequently has had required larger doses of insulin He has been on Lantus for the last few years along with Novolog,followed by an endocrinologist since 2008 His A1c was 6.3 in 04/2014   Prior to his consultation he was on a regimen of Novolog , 50 units 5 times a day with each meal and snack along with 90 Units 3 times a day of Lantus   Recent history:   INSULIN regimen is :TOUJEO 80 units a.m.-- 80 units evening daily  Humulin R U-500 insulin with KwikPen 50 units before breakfast, 70 before lunch and 60-70 before dinner  Previous Sliding scale use for high sugars: Sugar 150-199 take 2 units, 200-250= 4 units and over 250 take 6 units    Current blood sugar patterns and problems identified:  His A1c is still relatively higher at 7.5, was 7.6   Although he has no difficulty with checking his blood sugars now with his Accu-Chek he is primarily check blood sugars in the mornings are very rarely at bedtime or lunchtime  He says he gets tired in the evening after dialysis but does not check readings after meals on the other days  He is somewhat arbitrarily adjusting his morning insulin but not clear if blood sugars around lunchtime are controlled  However no hypoglycemia reported  He has occasional significantly high fasting readings and he cannot explain this  He does not think he forgets his evening insulin doses  However still has recently occasional low normal readings in the mornings and has not reduced his evening Toujeo as directed  Trying to get some  protein with most of his meals, less consistently at breakfast   Dinner 6 pm   Compliance with the medical regimen: Inconsistent   Glucose monitoring:  done 0-2 times a day         Glucometer: Accu-Chek       Blood Glucose readings and averages for 30 days through 8/6   PRE-MEAL Fasting Lunch Dinner Bedtime Overall  Glucose range: 72-292  87   98, 184   Mean/median:  152     148   Previous data:   PRE-MEAL Fasting Lunch Dinner  overnight  Overall  Glucose range:  61-380   57, 100   Mean/median:  166     154   POST-MEAL PC Breakfast PC Lunch PC Dinner  Glucose range:  ? ??  Mean/median:         Self-care: The diet that the patient has been following is: tries to limit high-fat meals, usually eating  2-3 meals a day.                    Dietician visit, most recent:2013               Exercise:  unable to do any, In wheelchair  Weight history:  Wt Readings from Last 3 Encounters:  03/22/20 285 lb (129.3 kg)  03/08/20 286  lb (129.7 kg)  12/08/19 281 lb 4.9 oz (127.6 kg)    Glycemic control:   Lab Results  Component Value Date   HGBA1C 7.5 (H) 05/13/2020   HGBA1C 7.6 (H) 01/26/2020   HGBA1C 7.2 (H) 07/21/2019   Lab Results  Component Value Date   MICROALBUR 232.9 (H) 06/19/2015   LDLCALC 43 05/13/2020   CREATININE 14.58 (HH) 10/20/2019    Lab Results  Component Value Date   FRUCTOSAMINE 387 (H) 01/26/2020   FRUCTOSAMINE 352 (H) 09/12/2018   FRUCTOSAMINE 408 (H) 07/23/2017      Allergies as of 05/17/2020      Reactions   Pork-derived Products Anaphylaxis, Other (See Comments)   Fever also    Ace Inhibitors Cough   Hydrocodone Other (See Comments)   Hallucinations   Pork Allergy    Other reaction(s): Unknown      Medication List       Accurate as of May 17, 2020 11:59 PM. If you have any questions, ask your nurse or doctor.        Accu-Chek FastClix Lancets Misc USE TO CHECK BLOOD SUGARS 4 TIMES A DAY.   Accu-Chek Guide test  strip Generic drug: glucose blood 1 each by Other route as needed for other. Use as instructed to check blood sugar 3 times daily. DX:E11.65   allopurinol 100 MG tablet Commonly known as: ZYLOPRIM Take 1 tablet (100 mg total) by mouth 3 (three) times a week. After dialysis   Auryxia 1 GM 210 MG(Fe) tablet Generic drug: ferric citrate   busPIRone 5 MG tablet Commonly known as: BUSPAR TAKE 1 TABLET (5 MG TOTAL) BY MOUTH 2 (TWO) TIMES DAILY AS NEEDED.   calcium acetate 667 MG capsule Commonly known as: PHOSLO Take 3 capsules (2,001 mg total) by mouth 3 (three) times daily with meals.   clonazePAM 0.5 MG tablet Commonly known as: KLONOPIN Take 0.5 mg by mouth 2 (two) times daily as needed for anxiety.   Droplet Pen Needles 32G X 4 MM Misc Generic drug: Insulin Pen Needle USE  TO INJECT FIVE TIMES DAILY AS DIRECTED   Insulin Pen Needle 32G X 8 MM Misc Use with insulin x5 times a day   heparin sodium (porcine) 1000 UNIT/ML injection Heparin Sodium (Porcine) 1,000 Units/mL Catheter Lock Venous   HumuLIN R U-500 KwikPen 500 UNIT/ML kwikpen Generic drug: insulin regular human CONCENTRATED Inject 50 units under the skin at breakfast, 70 at lunch, and 60 at dinner.   hydrocortisone 1 % ointment Apply 1 application topically 2 (two) times daily.   hydrocortisone-pramoxine rectal foam Commonly known as: PROCTOFOAM-HC Place 1 applicator rectally 2 (two) times daily.   hydrOXYzine 25 MG tablet Commonly known as: ATARAX/VISTARIL Take 1 tablet (25 mg total) by mouth 3 (three) times daily as needed for anxiety.   Lokelma 10 g Pack packet Generic drug: sodium zirconium cyclosilicate Take by mouth.   loperamide 2 MG capsule Commonly known as: IMODIUM Take 1 capsule (2 mg total) by mouth as needed for diarrhea or loose stools.   midodrine 10 MG tablet Commonly known as: PROAMATINE Take 10 mg by mouth as needed (low blood pressure).   mupirocin ointment 2 % Commonly known as:  BACTROBAN Apply 1 application topically 2 (two) times daily.   omeprazole 20 MG capsule Commonly known as: PRILOSEC Take 1 capsule (20 mg total) by mouth daily. Annual appt due in Oct must see provider for future refills   oxyCODONE-acetaminophen 5-325 MG tablet Commonly known as: PERCOCET/ROXICET  Take 1 tablet by mouth every 6 (six) hours as needed.   Renvela 800 MG tablet Generic drug: sevelamer carbonate Take 2,400 mg by mouth 3 (three) times daily with meals.   rosuvastatin 20 MG tablet Commonly known as: CRESTOR Take 1 tablet (20 mg total) by mouth at bedtime. Annual appt due in Oct must see provider for future refills   Suprep Bowel Prep Kit 17.5-3.13-1.6 GM/177ML Soln Generic drug: Na Sulfate-K Sulfate-Mg Sulf   Toujeo Max SoloStar 300 UNIT/ML Solostar Pen Generic drug: insulin glargine (2 Unit Dial) INJECT 80 UNITS UNDER THE SKIN IN THE MORNING AND 80 UNITS IN THE EVENING   Veltassa 8.4 g packet Generic drug: patiromer Take 8.4 g by mouth daily.   warfarin 5 MG tablet Commonly known as: COUMADIN Take as directed by the anticoagulation clinic. If you are unsure how to take this medication, talk to your nurse or doctor. Original instructions: TAKE 1 TABLET EVERY DAY OR AS DIRECTED BY COUMADIN CLINIC.       Allergies:  Allergies  Allergen Reactions  . Pork-Derived Products Anaphylaxis and Other (See Comments)    Fever also   . Ace Inhibitors Cough  . Hydrocodone Other (See Comments)    Hallucinations  . Pork Allergy     Other reaction(s): Unknown    Past Medical History:  Diagnosis Date  . Allergic rhinitis   . Allergy   . Anemia   . Aortic insufficiency    a. Echo 7/16:  Mod LVH, EF 50-55%, mild to mod AI, severe LAE, PASP 57 mmHg (LV ID end diastolic 32.2 mm);  b. Echo 2/17:  Mod LVH, EF 50-55%, mod AI, MAC, mild MR, mod LAE, mild to mod TR, PASP 63 mmHg  . Bilateral carpal tunnel syndrome   . Cataract    removed both eyes   . CHF (congestive heart  failure) ([place])   . Claustrophobia   . COPD (chronic obstructive pulmonary disease) ([place])   . Diabetes mellitus    Type II  . Diabetic peripheral neuropathy ([place])   . Diabetic retinopathy ([place])   . Discitis of lumbar region    resolved  . DVT (deep venous thrombosis) ([place]) 10/07/2015   LLE  . Endocarditis 11/28/14  . ESRD (end stage renal disease) on dialysis [facility])    s/p renal transplant in 2009, failed in 2016  . Gait disorder   . Gastroparesis   . GERD (gastroesophageal reflux disease)   . Gout   . Hearing difficulty   . Hemodialysis patient ([place])    tuesday, thursday, saturday   . Hyperlipidemia   . Hypertension   . Incomplete bladder emptying   . Leg pain 02/05/11   with walking  . Morbid obesity ([place])   . Obstructive sleep apnea    not using CPAP- lost 100lbs  . Osteomyelitis ([place])   . Posttraumatic stress disorder   . Rotator cuff disorder   . Secondary hyperparathyroidism ([place])   . Sepsis ([place])    Postive blood cultures -   . Sleep apnea    no cpap  . Urethral stricture   . Vitamin D deficiency     Past Surgical History:  Procedure Laterality Date  . ARTERIOVENOUS GRAFT PLACEMENT Bilateral "several"  . AV FISTULA PLACEMENT Bilateral 2015  . AV FISTULA PLACEMENT Right 06/13/2019   Procedure: INSERTION OF ARTERIOVENOUS (AV) GORE-TEX GRAFT RIGHT  ARM;  Surgeon: Elam Dutch, MD;  Location: [place];  Service: Vascular;  Laterality: Right;  . BASCILIC  VEIN TRANSPOSITION Right 09/27/2015  . [place] Right 09/27/2015   Procedure: BASILIC VEIN TRANSPOSITION right;  Surgeon: Mal Misty, MD;  Location: [place];  Service: Vascular;  Laterality: Right;  . CATARACT EXTRACTION W/ INTRAOCULAR LENS  IMPLANT, BILATERAL Bilateral   . CYSTOSCOPY W/ INTERNAL URETHROTOMY  09/2014  . IR THROMBECTOMY AV FISTULA W/THROMBOLYSIS/PTA INC/SHUNT/IMG RIGHT Right 08/01/2019  . IR US GUIDE VASC ACCESS RIGHT  08/01/2019  . KIDNEY TRANSPLANT  2009  . SIGMOIDOSCOPY      . UPPER EXTREMITY VENOGRAPHY Bilateral 06/02/2019   Procedure: UPPER EXTREMITY VENOGRAPHY;  Surgeon: Elam Dutch, MD;  Location: [place] [facility];  Service: Cardiovascular;  Laterality: Bilateral;  . UPPER GASTROINTESTINAL ENDOSCOPY      Family History  Problem Relation Age of Onset  . Hypertension Mother   . Diabetes Mother   . Diabetes Father   . Diabetes Brother   . Colon cancer Neg Hx   . Colon polyps Neg Hx   . Esophageal cancer Neg Hx   . Rectal cancer Neg Hx   . Stomach cancer Neg Hx     Social History:  reports that he has never smoked. He has never used smokeless tobacco. He reports that he does not drink alcohol and does not use drugs.    Review of Systems    ROS   Lipid history: He is on 20 mg Crestor with the following labs    Lab Results  Component Value Date   CHOL 93 05/13/2020   HDL 27.60 (L) 05/13/2020   LDLCALC 43 05/13/2020   TRIG 113.0 05/13/2020   CHOLHDL 3 05/13/2020            Hypertension: His blood pressure is managed by nephrologist and PCP   RENAL: He has had a renal transplant in 2009 and  he is on dialysis  Neurological: Has has had  decreased sensation in his feet, also complaining of tingling and burning Previously followed by neurologist  He has had severe problems with balance and also some weakness in his legs, using a power scooter  Last foot exam showed: Absent monofilament sensation in his feet and toes and extending up to the middle of the lower leg on the left and up to the ankle on the right  LABS:  Lab on 05/13/2020  Component Date Value Ref Range Status  . Cholesterol 05/13/2020 93  0 - 200 mg/dL Final   ATP III Classification       Desirable:  < 200 mg/dL               Borderline High:  200 - 239 mg/dL          High:  > = 240 mg/dL  . Triglycerides 05/13/2020 113.0  0 - 149 mg/dL Final   Normal:  <150 mg/dLBorderline High:  150 - 199 mg/dL  . HDL 05/13/2020 27.60* >39.00 mg/dL Final  . VLDL 05/13/2020  22.6  0.0 - 40.0 mg/dL Final  . LDL Cholesterol 05/13/2020 43  0 - 99 mg/dL Final  . Total CHOL/HDL Ratio 05/13/2020 3   Final                  Men          Women1/2 Average Risk     3.4          3.3Average Risk          5.0  4.42X Average Risk          9.6          7.13X Average Risk          15.0          11.0                      . NonHDL 05/13/2020 65.52   Final   NOTE:  Non-HDL goal should be 30 mg/dL higher than patient's LDL goal (i.e. LDL goal of < 70 mg/dL, would have non-HDL goal of < 100 mg/dL)  . ALT 05/13/2020 11  0 - 53 U/L Final  . Glucose, Bld 05/13/2020 97  70 - 99 mg/dL Final  . Hgb A1c MFr Bld 05/13/2020 7.5* 4.6 - 6.5 % Final   Glycemic Control Guidelines for People with Diabetes:Non Diabetic:  <6%Goal of Therapy: <7%Additional Action Suggested:  >8%     Physical Examination:  BP (!) 142/64 (BP Location: Left Arm, Patient Position: Sitting, Cuff Size: Normal)   Pulse (!) 105   Ht _0  (1.778 m)   SpO2 92%   BMI 40.89 kg/m      ASSESSMENT:  Diabetes type 2, long-standing with obesity  See history of present illness for detailed discussion of his current management, blood sugar patterns and problems identified.  His A1c about the same at 7.5  Again requiring large doses of insulin including basal insulin and U-500, 3 times a day  He is not understanding the need to check blood sugars at various times of the day to help adjust his Humulin insulin Also likely has high readings in the mornings on some days possibly from either forgetting his evening doses or relatively higher fat meals or snacks at night   HYPERCHOLESTEROLEMIA: Very well controlled, HDL relatively low, continue Crestor   PLAN:  He was reminded about the need to check blood sugars at various times especially after dinner at least every other day He will need to adjust his evening dose based on meal size and carbohydrates Also reduce his evening Toujeo by at least 4 units down to  76  Insulin instructions given in writing Discussed that we can adjust his Humulin R based only on readings after meals He may be a good candidate for insulin pump but does not check his blood sugars enough and likely will get confused about using this device  He is a good candidate for the freestyle libre and may be able to get this now, in the meantime he needs to at least check his blood sugars twice a day consistently   Patient Instructions  Toujeo 76 units in pm  Check blood sugars on waking up 5 days a week  Also check blood sugars about 2 hours after meals and do this after different meals by rotation  Recommended blood sugar levels on waking up are 90-130 and about 2 hours after meal is 130-180  Please bring your blood sugar monitor to each visit, thank you         Elayne Snare 05/19/2020, 9:47 PM   Note: This office note was prepared with Dragon voice recognition system technology. Any transcriptional errors that result from this process are unintentional.

## 2020-05-17 NOTE — Patient Instructions (Signed)
Toujeo 76 units in pm  Check blood sugars on waking up 5 days a week  Also check blood sugars about 2 hours after meals and do this after different meals by rotation  Recommended blood sugar levels on waking up are 90-130 and about 2 hours after meal is 130-180  Please bring your blood sugar monitor to each visit, thank you

## 2020-05-18 DIAGNOSIS — N2581 Secondary hyperparathyroidism of renal origin: Secondary | ICD-10-CM | POA: Diagnosis not present

## 2020-05-18 DIAGNOSIS — N186 End stage renal disease: Secondary | ICD-10-CM | POA: Diagnosis not present

## 2020-05-18 DIAGNOSIS — Z992 Dependence on renal dialysis: Secondary | ICD-10-CM | POA: Diagnosis not present

## 2020-05-20 ENCOUNTER — Ambulatory Visit (INDEPENDENT_AMBULATORY_CARE_PROVIDER_SITE_OTHER): Payer: Medicare HMO | Admitting: General Practice

## 2020-05-20 DIAGNOSIS — Z7901 Long term (current) use of anticoagulants: Secondary | ICD-10-CM | POA: Diagnosis not present

## 2020-05-20 LAB — POCT INR: INR: 2.8 (ref 2.0–3.0)

## 2020-05-20 NOTE — Progress Notes (Signed)
I have reviewed the results and agree with this plan   

## 2020-05-20 NOTE — Patient Instructions (Signed)
Pre visit review using our clinic review tool, if applicable. No additional management support is needed unless otherwise documented below in the visit note.  Continue to take 1 tablet daily except 1/2 tablet on Wednesdays. Re-check in 1 week.  Dialysis patient.  [facility] faxes INR results to [facility].  Dosing instructions LOVM.

## 2020-05-21 DIAGNOSIS — Z992 Dependence on renal dialysis: Secondary | ICD-10-CM | POA: Diagnosis not present

## 2020-05-21 DIAGNOSIS — N186 End stage renal disease: Secondary | ICD-10-CM | POA: Diagnosis not present

## 2020-05-21 DIAGNOSIS — N2581 Secondary hyperparathyroidism of renal origin: Secondary | ICD-10-CM | POA: Diagnosis not present

## 2020-05-23 DIAGNOSIS — Z992 Dependence on renal dialysis: Secondary | ICD-10-CM | POA: Diagnosis not present

## 2020-05-23 DIAGNOSIS — N2581 Secondary hyperparathyroidism of renal origin: Secondary | ICD-10-CM | POA: Diagnosis not present

## 2020-05-23 DIAGNOSIS — N186 End stage renal disease: Secondary | ICD-10-CM | POA: Diagnosis not present

## 2020-05-24 ENCOUNTER — Telehealth: Payer: Self-pay

## 2020-05-24 ENCOUNTER — Ambulatory Visit (INDEPENDENT_AMBULATORY_CARE_PROVIDER_SITE_OTHER): Payer: Medicare HMO

## 2020-05-24 DIAGNOSIS — Z7901 Long term (current) use of anticoagulants: Secondary | ICD-10-CM

## 2020-05-24 LAB — POCT INR: INR: 3.9 — AB (ref 2.0–3.0)

## 2020-05-24 NOTE — Patient Instructions (Addendum)
Pre visit review using our clinic review tool, if applicable. No additional management support is needed unless otherwise documented below in the visit note.  Advised to hold dose today, change weekly dose to take 5 mg daily except take 2.5mg  Wed (this would be a 7% decrease).  Recheck in 3 wks. Pt verbalized understanding.

## 2020-05-24 NOTE — Telephone Encounter (Signed)
Please see anticoag encounter from 8/13

## 2020-05-25 DIAGNOSIS — Z992 Dependence on renal dialysis: Secondary | ICD-10-CM | POA: Diagnosis not present

## 2020-05-25 DIAGNOSIS — N186 End stage renal disease: Secondary | ICD-10-CM | POA: Diagnosis not present

## 2020-05-25 DIAGNOSIS — N2581 Secondary hyperparathyroidism of renal origin: Secondary | ICD-10-CM | POA: Diagnosis not present

## 2020-05-27 NOTE — Telephone Encounter (Signed)
Thanks Shannon!

## 2020-05-28 ENCOUNTER — Telehealth: Payer: Self-pay | Admitting: Podiatry

## 2020-05-28 ENCOUNTER — Other Ambulatory Visit: Payer: Self-pay | Admitting: Podiatry

## 2020-05-28 DIAGNOSIS — N2581 Secondary hyperparathyroidism of renal origin: Secondary | ICD-10-CM | POA: Diagnosis not present

## 2020-05-28 DIAGNOSIS — N186 End stage renal disease: Secondary | ICD-10-CM | POA: Diagnosis not present

## 2020-05-28 DIAGNOSIS — Z992 Dependence on renal dialysis: Secondary | ICD-10-CM | POA: Diagnosis not present

## 2020-05-28 MED ORDER — CLINDAMYCIN HCL 300 MG PO CAPS: 300.0000 mg | ORAL_CAPSULE | Freq: Three times a day (TID) | ORAL | 0 refills | Status: DC

## 2020-05-28 NOTE — Telephone Encounter (Signed)
Pt called and wanted antibiotic he had a blister that busted he did not say it was infected but he wanted to be safe also I offered him appt for Thursday but unable due to dyalisis  Please assist

## 2020-05-28 NOTE — Telephone Encounter (Signed)
I have sent clindamycin to the pharmacy for him but please have him come in this week to be seen. If I am not here then he can see someone else.

## 2020-05-29 ENCOUNTER — Other Ambulatory Visit: Payer: Self-pay

## 2020-05-29 ENCOUNTER — Ambulatory Visit (INDEPENDENT_AMBULATORY_CARE_PROVIDER_SITE_OTHER): Payer: Medicare HMO | Admitting: Podiatry

## 2020-05-29 DIAGNOSIS — Z992 Dependence on renal dialysis: Secondary | ICD-10-CM | POA: Diagnosis not present

## 2020-05-29 DIAGNOSIS — S90822S Blister (nonthermal), left foot, sequela: Secondary | ICD-10-CM | POA: Diagnosis not present

## 2020-05-29 DIAGNOSIS — N186 End stage renal disease: Secondary | ICD-10-CM

## 2020-05-29 DIAGNOSIS — E1149 Type 2 diabetes mellitus with other diabetic neurological complication: Secondary | ICD-10-CM

## 2020-05-29 DIAGNOSIS — Z7901 Long term (current) use of anticoagulants: Secondary | ICD-10-CM

## 2020-05-29 DIAGNOSIS — L97522 Non-pressure chronic ulcer of other part of left foot with fat layer exposed: Secondary | ICD-10-CM

## 2020-05-29 MED ORDER — MUPIROCIN 2 % EX OINT: 1.0000 "application " | TOPICAL_OINTMENT | Freq: Every day | CUTANEOUS | 2 refills | Status: DC

## 2020-05-29 MED ORDER — CLINDAMYCIN HCL 300 MG PO CAPS
300.0000 mg | ORAL_CAPSULE | Freq: Three times a day (TID) | ORAL | 0 refills | Status: DC
Start: 2020-05-29 — End: 2020-07-24

## 2020-05-29 NOTE — Progress Notes (Signed)
  Subjective:  Patient ID: [name] [name], male    DOB: 1966-10-04,  MRN: [id number]  Chief Complaint  Patient presents with  . Blister    Left foot dorsal aspect blister, 5 day duration. Pt states the blister has popped and he has been applying mupirocin ointment. Pt states Dr. Jacqualyn Posey sent antibiotic rx.    54 y.o. male presents with the above complaint. History confirmed with patient.   Objective:  Physical Exam: warm, good capillary refill, normal DP and PT pulses and ulceration at dorsal left foot full-thickness in nature measures approximately 3 cm x 1.5 cm x 0.3 cm with fibrogranular base.  No cellulitis no purulence no malodor..     Assessment:   1. Ulcer of left foot with fat layer exposed ([place])   2. Type II diabetes mellitus with neurological manifestations ([place])   3. Chronic anticoagulation   4. ESRD on dialysis ([place])   5. Blister of left foot without infection, sequela      Plan:  Patient was evaluated and treated and all questions answered.  Patient educated on diabetes. Discussed proper diabetic foot care and discussed risks and complications of disease. Educated patient in depth on reasons to return to the office immediately should he/she discover anything concerning or new on the feet. All questions answered. Discussed proper shoes as well.    -Dressing applied consisting of silvadene -Offload ulcer with surgical shoe, he has one at home -Clindamycin prescription sent -Continue mupirocin at home, I sent him a refill -Wound cleansed and debrided  Procedure: Selective Debridement of Wound Rationale: Removal of devitalized tissue from the wound to promote healing.  Pre-Debridement Wound Measurements: 3.0 cm x 1.5 cm x 0.5 cm  Post-Debridement Wound Measurements: same as pre-debridement. Type of Debridement: sharp selective Tissue Removed: Devitalized soft-tissue Dressing: Dry, sterile, compression dressing. Disposition: Patient tolerated procedure well.  Patient to return in 2 week for follow-up.   No follow-ups on file.

## 2020-05-30 ENCOUNTER — Telehealth: Payer: Self-pay | Admitting: Podiatry

## 2020-05-30 ENCOUNTER — Ambulatory Visit: Payer: Medicare HMO | Admitting: Podiatry

## 2020-05-30 DIAGNOSIS — N2581 Secondary hyperparathyroidism of renal origin: Secondary | ICD-10-CM | POA: Diagnosis not present

## 2020-05-30 DIAGNOSIS — N186 End stage renal disease: Secondary | ICD-10-CM | POA: Diagnosis not present

## 2020-05-30 DIAGNOSIS — Z992 Dependence on renal dialysis: Secondary | ICD-10-CM | POA: Diagnosis not present

## 2020-05-30 NOTE — Telephone Encounter (Signed)
An from [facility] called stating that he needed to verify the frequency of order change or resend orders contact # [phone]  Fax # [phone]

## 2020-05-30 NOTE — Telephone Encounter (Signed)
[name] [name]- it would be daily dressing changes. Can you please call to verify with them?

## 2020-05-31 NOTE — Telephone Encounter (Signed)
Called [facility] and spoke with Nate and relayed the message per Dr Jacqualyn Posey. Lattie Haw

## 2020-06-01 DIAGNOSIS — N186 End stage renal disease: Secondary | ICD-10-CM | POA: Diagnosis not present

## 2020-06-01 DIAGNOSIS — Z992 Dependence on renal dialysis: Secondary | ICD-10-CM | POA: Diagnosis not present

## 2020-06-01 DIAGNOSIS — N2581 Secondary hyperparathyroidism of renal origin: Secondary | ICD-10-CM | POA: Diagnosis not present

## 2020-06-03 DIAGNOSIS — L97509 Non-pressure chronic ulcer of other part of unspecified foot with unspecified severity: Secondary | ICD-10-CM | POA: Diagnosis not present

## 2020-06-04 ENCOUNTER — Other Ambulatory Visit: Payer: Self-pay

## 2020-06-04 DIAGNOSIS — N2581 Secondary hyperparathyroidism of renal origin: Secondary | ICD-10-CM | POA: Diagnosis not present

## 2020-06-04 DIAGNOSIS — N186 End stage renal disease: Secondary | ICD-10-CM | POA: Diagnosis not present

## 2020-06-04 DIAGNOSIS — Z992 Dependence on renal dialysis: Secondary | ICD-10-CM | POA: Diagnosis not present

## 2020-06-04 NOTE — Telephone Encounter (Signed)
1.Medication Requested:clonazePAM (KLONOPIN) 0.5 MG tablet  2. Pharmacy (Name, [facility], [place], [place]  3. On Med List: Yes   4. Last Visit with PCP: 6.11.2021   5. Next visit date with PCP: n/a    Agent: Please be advised that RX refills may take up to 3 business days. We ask that you follow-up with your pharmacy.

## 2020-06-05 ENCOUNTER — Other Ambulatory Visit: Payer: Self-pay | Admitting: Internal Medicine

## 2020-06-05 MED ORDER — CLONAZEPAM 0.5 MG PO TABS: 0.5000 mg | ORAL_TABLET | Freq: Two times a day (BID) | ORAL | 1 refills | Status: DC | PRN

## 2020-06-05 NOTE — Addendum Note (Signed)
Addended by: Cassandria Anger on: 06/05/2020 07:24 AM   Modules accepted: Orders

## 2020-06-05 NOTE — Telephone Encounter (Signed)
Rx sent to [facility]

## 2020-06-06 DIAGNOSIS — N186 End stage renal disease: Secondary | ICD-10-CM | POA: Diagnosis not present

## 2020-06-06 DIAGNOSIS — Z992 Dependence on renal dialysis: Secondary | ICD-10-CM | POA: Diagnosis not present

## 2020-06-06 DIAGNOSIS — N2581 Secondary hyperparathyroidism of renal origin: Secondary | ICD-10-CM | POA: Diagnosis not present

## 2020-06-07 NOTE — Telephone Encounter (Signed)
Needs script for 60 tablets, patient takes two tablets daily

## 2020-06-08 DIAGNOSIS — N2581 Secondary hyperparathyroidism of renal origin: Secondary | ICD-10-CM | POA: Diagnosis not present

## 2020-06-08 DIAGNOSIS — N186 End stage renal disease: Secondary | ICD-10-CM | POA: Diagnosis not present

## 2020-06-08 DIAGNOSIS — Z992 Dependence on renal dialysis: Secondary | ICD-10-CM | POA: Diagnosis not present

## 2020-06-10 ENCOUNTER — Ambulatory Visit (INDEPENDENT_AMBULATORY_CARE_PROVIDER_SITE_OTHER): Payer: Medicare HMO | Admitting: Podiatry

## 2020-06-10 ENCOUNTER — Other Ambulatory Visit: Payer: Self-pay

## 2020-06-10 DIAGNOSIS — L97522 Non-pressure chronic ulcer of other part of left foot with fat layer exposed: Secondary | ICD-10-CM | POA: Diagnosis not present

## 2020-06-10 DIAGNOSIS — E1149 Type 2 diabetes mellitus with other diabetic neurological complication: Secondary | ICD-10-CM

## 2020-06-11 ENCOUNTER — Other Ambulatory Visit: Payer: Self-pay | Admitting: Endocrinology

## 2020-06-11 ENCOUNTER — Other Ambulatory Visit: Payer: Self-pay | Admitting: Internal Medicine

## 2020-06-11 DIAGNOSIS — N186 End stage renal disease: Secondary | ICD-10-CM | POA: Diagnosis not present

## 2020-06-11 DIAGNOSIS — Z992 Dependence on renal dialysis: Secondary | ICD-10-CM | POA: Diagnosis not present

## 2020-06-11 DIAGNOSIS — E1129 Type 2 diabetes mellitus with other diabetic kidney complication: Secondary | ICD-10-CM | POA: Diagnosis not present

## 2020-06-11 DIAGNOSIS — N2581 Secondary hyperparathyroidism of renal origin: Secondary | ICD-10-CM | POA: Diagnosis not present

## 2020-06-11 NOTE — Addendum Note (Signed)
Addended by: Lauralee Evener C on: 06/11/2020 09:05 AM   Modules accepted: Orders

## 2020-06-11 NOTE — Telephone Encounter (Signed)
Please advise in PCPs absence.  

## 2020-06-12 NOTE — Telephone Encounter (Signed)
Pt's medication was filled 06/05/20. He will have to have quantity changed once it's time to refill. Pt aware

## 2020-06-13 DIAGNOSIS — Z992 Dependence on renal dialysis: Secondary | ICD-10-CM | POA: Diagnosis not present

## 2020-06-13 DIAGNOSIS — N2581 Secondary hyperparathyroidism of renal origin: Secondary | ICD-10-CM | POA: Diagnosis not present

## 2020-06-13 DIAGNOSIS — N186 End stage renal disease: Secondary | ICD-10-CM | POA: Diagnosis not present

## 2020-06-13 LAB — POCT INR: INR: 3.4 — AB (ref 2.0–3.0)

## 2020-06-13 NOTE — Progress Notes (Signed)
Subjective: 54 year old male presents the office today for follow-up evaluation of a blister to the top of his left foot.  He states that the area is starting to dry and he has not seen any drainage or pus and denies any surrounding redness or red streaks.  He has no new concerns today.  Denies any systemic complaints such as fevers, chills, nausea, vomiting.   Objective: AAO x3, NAD DP/PT pulses palpable bilaterally, CRT less than 3 seconds On the dorsal aspect of the left midfoot on the other previous blister scab is starting to form this and hyperkeratotic tissue on the periphery.  There is no ascending cellulitis there is no fluctuation or crepitation peer there is no drainage or pus identified today.  No open lesions or pre-ulcerative lesions.  No pain with calf compression, swelling, warmth, erythema     Assessment: Left foot healing wound  Plan: -All treatment options discussed with the patient including all alternatives, risks, complications.  -Sharp debrided the wound today utilizing the 312 with scalpel without any complications to remove some the hyperkeratotic periwound.  Continue with antibiotic ointment dressing changes daily.  He has mupirocin ointment.  He can wash the area with soap and water daily.  Continue offloading. -Monitor for any clinical signs or symptoms of infection and directed to call the office immediately should any occur or go to the ER. -Patient encouraged to call the office with any questions, concerns, change in symptoms.   Trula Slade DPM

## 2020-06-14 DIAGNOSIS — I5023 Acute on chronic systolic (congestive) heart failure: Secondary | ICD-10-CM | POA: Diagnosis not present

## 2020-06-14 DIAGNOSIS — I959 Hypotension, unspecified: Secondary | ICD-10-CM | POA: Diagnosis not present

## 2020-06-14 DIAGNOSIS — R918 Other nonspecific abnormal finding of lung field: Secondary | ICD-10-CM | POA: Diagnosis not present

## 2020-06-14 DIAGNOSIS — E1151 Type 2 diabetes mellitus with diabetic peripheral angiopathy without gangrene: Secondary | ICD-10-CM | POA: Diagnosis not present

## 2020-06-14 DIAGNOSIS — I444 Left anterior fascicular block: Secondary | ICD-10-CM | POA: Diagnosis not present

## 2020-06-14 DIAGNOSIS — N2581 Secondary hyperparathyroidism of renal origin: Secondary | ICD-10-CM | POA: Diagnosis not present

## 2020-06-14 DIAGNOSIS — I700 Atherosclerosis of aorta: Secondary | ICD-10-CM | POA: Diagnosis not present

## 2020-06-14 DIAGNOSIS — I1311 Hypertensive heart and chronic kidney disease without heart failure, with stage 5 chronic kidney disease, or end stage renal disease: Secondary | ICD-10-CM | POA: Diagnosis not present

## 2020-06-14 DIAGNOSIS — I517 Cardiomegaly: Secondary | ICD-10-CM | POA: Diagnosis not present

## 2020-06-14 DIAGNOSIS — E1122 Type 2 diabetes mellitus with diabetic chronic kidney disease: Secondary | ICD-10-CM | POA: Diagnosis not present

## 2020-06-14 DIAGNOSIS — N186 End stage renal disease: Secondary | ICD-10-CM | POA: Diagnosis not present

## 2020-06-14 DIAGNOSIS — I10 Essential (primary) hypertension: Secondary | ICD-10-CM | POA: Diagnosis not present

## 2020-06-14 DIAGNOSIS — I82492 Acute embolism and thrombosis of other specified deep vein of left lower extremity: Secondary | ICD-10-CM | POA: Diagnosis not present

## 2020-06-14 DIAGNOSIS — Z7682 Awaiting organ transplant status: Secondary | ICD-10-CM | POA: Diagnosis not present

## 2020-06-14 DIAGNOSIS — E11319 Type 2 diabetes mellitus with unspecified diabetic retinopathy without macular edema: Secondary | ICD-10-CM | POA: Diagnosis not present

## 2020-06-14 DIAGNOSIS — E1142 Type 2 diabetes mellitus with diabetic polyneuropathy: Secondary | ICD-10-CM | POA: Diagnosis not present

## 2020-06-14 DIAGNOSIS — T8611 Kidney transplant rejection: Secondary | ICD-10-CM | POA: Diagnosis not present

## 2020-06-14 DIAGNOSIS — N401 Enlarged prostate with lower urinary tract symptoms: Secondary | ICD-10-CM | POA: Diagnosis not present

## 2020-06-15 DIAGNOSIS — N2581 Secondary hyperparathyroidism of renal origin: Secondary | ICD-10-CM | POA: Diagnosis not present

## 2020-06-15 DIAGNOSIS — Z992 Dependence on renal dialysis: Secondary | ICD-10-CM | POA: Diagnosis not present

## 2020-06-15 DIAGNOSIS — N186 End stage renal disease: Secondary | ICD-10-CM | POA: Diagnosis not present

## 2020-06-18 ENCOUNTER — Ambulatory Visit (INDEPENDENT_AMBULATORY_CARE_PROVIDER_SITE_OTHER): Payer: Medicare HMO | Admitting: General Practice

## 2020-06-18 DIAGNOSIS — N2581 Secondary hyperparathyroidism of renal origin: Secondary | ICD-10-CM | POA: Diagnosis not present

## 2020-06-18 DIAGNOSIS — N186 End stage renal disease: Secondary | ICD-10-CM | POA: Diagnosis not present

## 2020-06-18 DIAGNOSIS — Z992 Dependence on renal dialysis: Secondary | ICD-10-CM | POA: Diagnosis not present

## 2020-06-18 DIAGNOSIS — Z7901 Long term (current) use of anticoagulants: Secondary | ICD-10-CM | POA: Diagnosis not present

## 2020-06-18 LAB — POCT INR: INR: 3.4 — AB (ref 2.0–3.0)

## 2020-06-18 NOTE — Patient Instructions (Signed)
Pre visit review using our clinic review tool, if applicable. No additional management support is needed unless otherwise documented below in the visit note.  Hold dosage today change weekly dose to take 5 mg daily except take 2.5mg  Wed and Saturdays.  LMOVM.  Dialysis patient at [facility].

## 2020-06-19 NOTE — Progress Notes (Signed)
Medical screening examination/treatment/procedure(s) were performed by non-physician practitioner and as supervising physician I was immediately available for consultation/collaboration. I agree with above. Linnie Mcglocklin, MD   

## 2020-06-20 DIAGNOSIS — N2581 Secondary hyperparathyroidism of renal origin: Secondary | ICD-10-CM | POA: Diagnosis not present

## 2020-06-20 DIAGNOSIS — N186 End stage renal disease: Secondary | ICD-10-CM | POA: Diagnosis not present

## 2020-06-20 DIAGNOSIS — Z992 Dependence on renal dialysis: Secondary | ICD-10-CM | POA: Diagnosis not present

## 2020-06-21 LAB — POCT INR: INR: 3.7 — AB (ref 2.0–3.0)

## 2020-06-22 DIAGNOSIS — N186 End stage renal disease: Secondary | ICD-10-CM | POA: Diagnosis not present

## 2020-06-22 DIAGNOSIS — Z992 Dependence on renal dialysis: Secondary | ICD-10-CM | POA: Diagnosis not present

## 2020-06-22 DIAGNOSIS — N2581 Secondary hyperparathyroidism of renal origin: Secondary | ICD-10-CM | POA: Diagnosis not present

## 2020-06-24 ENCOUNTER — Ambulatory Visit: Payer: Medicare HMO | Admitting: Podiatry

## 2020-06-25 ENCOUNTER — Ambulatory Visit (INDEPENDENT_AMBULATORY_CARE_PROVIDER_SITE_OTHER): Payer: Medicare HMO | Admitting: General Practice

## 2020-06-25 DIAGNOSIS — Z992 Dependence on renal dialysis: Secondary | ICD-10-CM | POA: Diagnosis not present

## 2020-06-25 DIAGNOSIS — N2581 Secondary hyperparathyroidism of renal origin: Secondary | ICD-10-CM | POA: Diagnosis not present

## 2020-06-25 DIAGNOSIS — N186 End stage renal disease: Secondary | ICD-10-CM | POA: Diagnosis not present

## 2020-06-25 DIAGNOSIS — Z7901 Long term (current) use of anticoagulants: Secondary | ICD-10-CM | POA: Diagnosis not present

## 2020-06-25 NOTE — Progress Notes (Signed)
Medical screening examination/treatment/procedure(s) were performed by non-physician practitioner and as supervising physician I was immediately available for consultation/collaboration. I agree with above. Avey Mcmanamon, MD   

## 2020-06-25 NOTE — Patient Instructions (Signed)
Pre visit review using our clinic review tool, if applicable. No additional management support is needed unless otherwise documented below in the visit note.  Hold dosage today change weekly dose  to take 5 mg daily except take 2.5mg  on Monday Wed and Saturdays.  LMOVM.  Dialysis patient at [facility].

## 2020-06-27 DIAGNOSIS — N186 End stage renal disease: Secondary | ICD-10-CM | POA: Diagnosis not present

## 2020-06-27 DIAGNOSIS — N2581 Secondary hyperparathyroidism of renal origin: Secondary | ICD-10-CM | POA: Diagnosis not present

## 2020-06-27 DIAGNOSIS — Z992 Dependence on renal dialysis: Secondary | ICD-10-CM | POA: Diagnosis not present

## 2020-06-29 DIAGNOSIS — Z992 Dependence on renal dialysis: Secondary | ICD-10-CM | POA: Diagnosis not present

## 2020-06-29 DIAGNOSIS — N186 End stage renal disease: Secondary | ICD-10-CM | POA: Diagnosis not present

## 2020-06-29 DIAGNOSIS — N2581 Secondary hyperparathyroidism of renal origin: Secondary | ICD-10-CM | POA: Diagnosis not present

## 2020-07-01 ENCOUNTER — Ambulatory Visit (INDEPENDENT_AMBULATORY_CARE_PROVIDER_SITE_OTHER): Payer: Medicare HMO | Admitting: Podiatry

## 2020-07-01 ENCOUNTER — Other Ambulatory Visit: Payer: Self-pay

## 2020-07-01 DIAGNOSIS — Z7901 Long term (current) use of anticoagulants: Secondary | ICD-10-CM | POA: Diagnosis not present

## 2020-07-01 DIAGNOSIS — E1149 Type 2 diabetes mellitus with other diabetic neurological complication: Secondary | ICD-10-CM

## 2020-07-01 DIAGNOSIS — L97522 Non-pressure chronic ulcer of other part of left foot with fat layer exposed: Secondary | ICD-10-CM

## 2020-07-01 DIAGNOSIS — L84 Corns and callosities: Secondary | ICD-10-CM

## 2020-07-02 DIAGNOSIS — Z992 Dependence on renal dialysis: Secondary | ICD-10-CM | POA: Diagnosis not present

## 2020-07-02 DIAGNOSIS — N186 End stage renal disease: Secondary | ICD-10-CM | POA: Diagnosis not present

## 2020-07-02 DIAGNOSIS — N2581 Secondary hyperparathyroidism of renal origin: Secondary | ICD-10-CM | POA: Diagnosis not present

## 2020-07-04 DIAGNOSIS — Z992 Dependence on renal dialysis: Secondary | ICD-10-CM | POA: Diagnosis not present

## 2020-07-04 DIAGNOSIS — N2581 Secondary hyperparathyroidism of renal origin: Secondary | ICD-10-CM | POA: Diagnosis not present

## 2020-07-04 DIAGNOSIS — N186 End stage renal disease: Secondary | ICD-10-CM | POA: Diagnosis not present

## 2020-07-04 NOTE — Progress Notes (Signed)
Subjective: 54 year old male presents the office for follow-up evaluation of a blister, wound on the top of his left foot.  States he is doing well and the area has been improving.  Denies any drainage or pus any swelling or redness.  Callus to the distal aspects of bilateral hallux.  No other open sores identified.  No other concerns today. Denies any systemic complaints such as fevers, chills, nausea, vomiting. No acute changes since last appointment, and no other complaints at this time.   Objective: AAO x3, NAD DP/PT pulses palpable bilaterally, CRT less than 3 seconds On the dorsal aspect the left midfoot small scab is present in the wound has scabbed over.  There is no surrounding erythema, drainage or pus or any fluctuation or crepitation but there is no malodor.  Hyperkeratotic lesion at the distal aspect of bilateral hallux without any underlying ulceration drainage or signs of infection. No pain with calf compression, swelling, warmth, erythema  Assessment: 54 year old male with ulceration left foot, preulcerative calluses  Plan: -All treatment options discussed with the patient including all alternatives, risks, complications.  -I would apply small amounts of Silvadene ointment to the wound on the left foot daily.  Monitoring signs or symptoms of infection.  Debrided the hyperkeratotic lesions without any complications or bleeding x2. -Patient encouraged to call the office with any questions, concerns, change in symptoms.

## 2020-07-05 LAB — PROTIME-INR: INR: 2.6 — AB (ref <=1.1)

## 2020-07-06 DIAGNOSIS — N186 End stage renal disease: Secondary | ICD-10-CM | POA: Diagnosis not present

## 2020-07-06 DIAGNOSIS — N2581 Secondary hyperparathyroidism of renal origin: Secondary | ICD-10-CM | POA: Diagnosis not present

## 2020-07-06 DIAGNOSIS — Z992 Dependence on renal dialysis: Secondary | ICD-10-CM | POA: Diagnosis not present

## 2020-07-08 ENCOUNTER — Telehealth: Payer: Self-pay | Admitting: Internal Medicine

## 2020-07-08 NOTE — Progress Notes (Signed)
  Chronic Care Management   Note  07/08/2020 Name: [name] [name] MRN: [id number] DOB: [birth year]  [name] [name] is a 54 y.o. year old male who is a primary care patient of Hoyt Koch, MD. I reached out to [facility] by phone today in response to a referral sent by [name] [name]'s PCP, Hoyt Koch, MD.   [name] [name] was given information about Chronic Care Management services today including:  1. CCM service includes personalized support from designated clinical staff supervised by his physician, including individualized plan of care and coordination with other care providers 2. 24/7 contact phone numbers for assistance for urgent and routine care needs. 3. Service will only be billed when office clinical staff spend 20 minutes or more in a month to coordinate care. 4. Only one practitioner may furnish and bill the service in a calendar month. 5. The patient may stop CCM services at any time (effective at the end of the month) by phone call to the office staff.   Patient agreed to services and verbal consent obtained.   Follow up plan:   Carley Perdue [employer] Scheduler

## 2020-07-09 ENCOUNTER — Other Ambulatory Visit: Payer: Self-pay | Admitting: Endocrinology

## 2020-07-09 DIAGNOSIS — N186 End stage renal disease: Secondary | ICD-10-CM | POA: Diagnosis not present

## 2020-07-09 DIAGNOSIS — N2581 Secondary hyperparathyroidism of renal origin: Secondary | ICD-10-CM | POA: Diagnosis not present

## 2020-07-09 DIAGNOSIS — Z992 Dependence on renal dialysis: Secondary | ICD-10-CM | POA: Diagnosis not present

## 2020-07-10 DIAGNOSIS — M4627 Osteomyelitis of vertebra, lumbosacral region: Secondary | ICD-10-CM | POA: Diagnosis not present

## 2020-07-10 DIAGNOSIS — I38 Endocarditis, valve unspecified: Secondary | ICD-10-CM | POA: Diagnosis not present

## 2020-07-10 DIAGNOSIS — E1142 Type 2 diabetes mellitus with diabetic polyneuropathy: Secondary | ICD-10-CM | POA: Diagnosis not present

## 2020-07-10 DIAGNOSIS — I358 Other nonrheumatic aortic valve disorders: Secondary | ICD-10-CM | POA: Diagnosis not present

## 2020-07-10 DIAGNOSIS — R2681 Unsteadiness on feet: Secondary | ICD-10-CM | POA: Diagnosis not present

## 2020-07-11 ENCOUNTER — Ambulatory Visit (INDEPENDENT_AMBULATORY_CARE_PROVIDER_SITE_OTHER): Payer: Medicare HMO | Admitting: General Practice

## 2020-07-11 DIAGNOSIS — Z7901 Long term (current) use of anticoagulants: Secondary | ICD-10-CM | POA: Diagnosis not present

## 2020-07-11 DIAGNOSIS — N186 End stage renal disease: Secondary | ICD-10-CM | POA: Diagnosis not present

## 2020-07-11 DIAGNOSIS — Z992 Dependence on renal dialysis: Secondary | ICD-10-CM | POA: Diagnosis not present

## 2020-07-11 DIAGNOSIS — E1122 Type 2 diabetes mellitus with diabetic chronic kidney disease: Secondary | ICD-10-CM | POA: Diagnosis not present

## 2020-07-11 DIAGNOSIS — N2581 Secondary hyperparathyroidism of renal origin: Secondary | ICD-10-CM | POA: Diagnosis not present

## 2020-07-11 NOTE — Progress Notes (Signed)
Medical screening examination/treatment/procedure(s) were performed by non-physician practitioner and as supervising physician I was immediately available for consultation/collaboration. I agree with above. Milliana Reddoch, MD   

## 2020-07-11 NOTE — Patient Instructions (Signed)
Pre visit review using our clinic review tool, if applicable. No additional management support is needed unless otherwise documented below in the visit note.  Continue to to take 5 mg daily except take 2.5mg  on Monday Wed and Saturdays.  LMOVM.  Dialysis patient at [facility].

## 2020-07-12 ENCOUNTER — Telehealth: Payer: Self-pay | Admitting: *Deleted

## 2020-07-12 DIAGNOSIS — T82858A Stenosis of vascular prosthetic devices, implants and grafts, initial encounter: Secondary | ICD-10-CM | POA: Diagnosis not present

## 2020-07-12 DIAGNOSIS — N186 End stage renal disease: Secondary | ICD-10-CM | POA: Diagnosis not present

## 2020-07-12 DIAGNOSIS — I871 Compression of vein: Secondary | ICD-10-CM | POA: Diagnosis not present

## 2020-07-12 DIAGNOSIS — I499 Cardiac arrhythmia, unspecified: Secondary | ICD-10-CM

## 2020-07-12 DIAGNOSIS — I219 Acute myocardial infarction, unspecified: Secondary | ICD-10-CM

## 2020-07-12 HISTORY — PX: CARDIAC CATHETERIZATION: SHX172

## 2020-07-12 HISTORY — DX: Cardiac arrhythmia, unspecified: I49.9

## 2020-07-12 HISTORY — DX: Acute myocardial infarction, unspecified: I21.9

## 2020-07-12 NOTE — Telephone Encounter (Signed)
Humulin R U-500 kwikpen not covered by insurance. Please advise

## 2020-07-12 NOTE — Telephone Encounter (Signed)
Needs to find out if they will consider the insulin vials with the syringes

## 2020-07-13 DIAGNOSIS — N2581 Secondary hyperparathyroidism of renal origin: Secondary | ICD-10-CM | POA: Diagnosis not present

## 2020-07-13 DIAGNOSIS — Z992 Dependence on renal dialysis: Secondary | ICD-10-CM | POA: Diagnosis not present

## 2020-07-13 DIAGNOSIS — N186 End stage renal disease: Secondary | ICD-10-CM | POA: Diagnosis not present

## 2020-07-16 DIAGNOSIS — Z992 Dependence on renal dialysis: Secondary | ICD-10-CM | POA: Diagnosis not present

## 2020-07-16 DIAGNOSIS — N186 End stage renal disease: Secondary | ICD-10-CM | POA: Diagnosis not present

## 2020-07-16 DIAGNOSIS — N2581 Secondary hyperparathyroidism of renal origin: Secondary | ICD-10-CM | POA: Diagnosis not present

## 2020-07-18 DIAGNOSIS — Z992 Dependence on renal dialysis: Secondary | ICD-10-CM | POA: Diagnosis not present

## 2020-07-18 DIAGNOSIS — N2581 Secondary hyperparathyroidism of renal origin: Secondary | ICD-10-CM | POA: Diagnosis not present

## 2020-07-18 DIAGNOSIS — N186 End stage renal disease: Secondary | ICD-10-CM | POA: Diagnosis not present

## 2020-07-19 ENCOUNTER — Telehealth: Payer: Self-pay | Admitting: Cardiovascular Disease

## 2020-07-19 NOTE — Telephone Encounter (Signed)
Pt c/o of Chest Pain: STAT if CP now or developed within 24 hours  1. Are you having CP right now? yes  2. Are you experiencing any other symptoms (ex. SOB, nausea, vomiting, sweating)? no  3. How long have you been experiencing CP? A week  4. Is your CP continuous or coming and going? Comes and goes  5. Have you taken Nitroglycerin? No   Patient states when he eats he has pain in his chest. He states it feels like his heart is going to explode. He states it does not feel like acid reflux.    ?

## 2020-07-19 NOTE — Telephone Encounter (Signed)
Spoke with pt who is complaining of active CP.  Pt states he has been having this pain off and on for about a week.  He states he is unable to walk up the stairs.  Denies diaphoresis, nausea or vomiting at this time.  Pt advised due to active CP he should go to ED for further evaluation.  Pt advised not to drive himself to ED.  Pt verbalizes understanding and agrees with current plan

## 2020-07-19 NOTE — Progress Notes (Signed)
CARDIOLOGY OFFICE NOTE  Date:  07/24/2020    [name] [name] Date of Birth: [birth year] Medical Record #[id number]  PCP:  Hoyt Koch, MD  Cardiologist:  Pedro Earls  Chief Complaint  Patient presents with  . Chest Pain    History of Present Illness: [name] [name] is a 54 y.o. male who presents today for a work in visit for chest pain. Seen for Dr. Angelena Form.   He has a history of IDDM, ESRD with renal transplant 2009 ([facility]) - on HD, chronic diastolic CHF, atrial tach/flutter - has been seen in AF clinic in 10/2018 - noted AFL post dialysis - had no change in therapy and to consider amiodarone if symptomatic; chronic coumadin therapy, HTN, HLD, OSA, obesity, aortic valve disease with prior endocarditis in 2016 - had had AVR recommended - patient did not wish to proceed; PAD and prior DVT in 2016 treated with Coumadin.   He was last seen by Dr. Angelena Form in May of 2019. Seen in AF clinic in 10/2018. Last seen by Richardson Dopp, PA in February of 2021 - felt to be doing well. Did need clearance for colonoscopy. Uses a motorized chair due to poor balance.   Comes in today. Here alone. He feels like he needs some test. He has chest pressure - sometimes with eating - sometimes with stress. He says sometimes he can't lay down but then says he has to sit down to "apease the pain". He has felt like this for 2 weeks. Keeps saying that it happens when he gets upset but does not say what is actually upsetting him. His balance is poor. He can go up some steps - this does not cause the chest pain. Has with eating/over eating and better after going to the bathroom. Not short of breath. Not dizzy. He is not vaccinated for COVID 19. He is not taking the Prilosec every day - did not think he needed this. He was trying to take less medicine. He is pretty adamant that he needs testing.   Past Medical History:  Diagnosis Date  . Allergic rhinitis   . Allergy   . Anemia   . Aortic  insufficiency    a. Echo 7/16:  Mod LVH, EF 50-55%, mild to mod AI, severe LAE, PASP 57 mmHg (LV ID end diastolic 88.5 mm);  b. Echo 2/17:  Mod LVH, EF 50-55%, mod AI, MAC, mild MR, mod LAE, mild to mod TR, PASP 63 mmHg  . Bilateral carpal tunnel syndrome   . Cataract    removed both eyes   . CHF (congestive heart failure) ([place])   . Claustrophobia   . COPD (chronic obstructive pulmonary disease) ([place])   . Diabetes mellitus    Type II  . Diabetic peripheral neuropathy ([place])   . Diabetic retinopathy ([place])   . Discitis of lumbar region    resolved  . DVT (deep venous thrombosis) ([place]) 10/07/2015   LLE  . Endocarditis 11/28/14  . ESRD (end stage renal disease) on dialysis [facility])    s/p renal transplant in 2009, failed in 2016  . Gait disorder   . Gastroparesis   . GERD (gastroesophageal reflux disease)   . Gout   . Hearing difficulty   . Hemodialysis patient ([place])    tuesday, thursday, saturday   . Hyperlipidemia   . Hypertension   . Incomplete bladder emptying   . Leg pain 02/05/11   with walking  . Morbid obesity ([place])   . Obstructive  sleep apnea    not using CPAP- lost 100lbs  . Osteomyelitis ([place])   . Posttraumatic stress disorder   . Rotator cuff disorder   . Secondary hyperparathyroidism ([place])   . Sepsis ([place])    Postive blood cultures -   . Sleep apnea    no cpap  . Urethral stricture   . Vitamin D deficiency     Past Surgical History:  Procedure Laterality Date  . ARTERIOVENOUS GRAFT PLACEMENT Bilateral "several"  . AV FISTULA PLACEMENT Bilateral 2015  . AV FISTULA PLACEMENT Right 06/13/2019   Procedure: INSERTION OF ARTERIOVENOUS (AV) GORE-TEX GRAFT RIGHT  ARM;  Surgeon: Elam Dutch, MD;  Location: [facility];  Service: Vascular;  Laterality: Right;  . BASCILIC VEIN TRANSPOSITION Right 09/27/2015  . [place] Right 09/27/2015   Procedure: BASILIC VEIN TRANSPOSITION right;  Surgeon: Mal Misty, MD;  Location: [facility];  Service: Vascular;   Laterality: Right;  . CATARACT EXTRACTION W/ INTRAOCULAR LENS  IMPLANT, BILATERAL Bilateral   . CYSTOSCOPY W/ INTERNAL URETHROTOMY  09/2014  . IR THROMBECTOMY AV FISTULA W/THROMBOLYSIS/PTA INC/SHUNT/IMG RIGHT Right 08/01/2019  . IR US GUIDE VASC ACCESS RIGHT  08/01/2019  . KIDNEY TRANSPLANT  2009  . SIGMOIDOSCOPY    . UPPER EXTREMITY VENOGRAPHY Bilateral 06/02/2019   Procedure: UPPER EXTREMITY VENOGRAPHY;  Surgeon: Elam Dutch, MD;  Location: [facility];  Service: Cardiovascular;  Laterality: Bilateral;  . UPPER GASTROINTESTINAL ENDOSCOPY       Medications: Current Meds  Medication Sig  . Accu-Chek FastClix Lancets MISC USE TO CHECK BLOOD SUGARS 4 TIMES A DAY.  Marland Kitchen allopurinol (ZYLOPRIM) 100 MG tablet Take 1 tablet (100 mg total) by mouth 3 (three) times a week. After dialysis  . AURYXIA 1 GM 210 MG(Fe) tablet   . busPIRone (BUSPAR) 5 MG tablet TAKE 1 TABLET (5 MG TOTAL) BY MOUTH 2 (TWO) TIMES DAILY AS NEEDED.  . calcium acetate (PHOSLO) 667 MG capsule Take 3 capsules (2,001 mg total) by mouth 3 (three) times daily with meals.  . clonazePAM (KLONOPIN) 0.5 MG tablet Take 1 tablet (0.5 mg total) by mouth 2 (two) times daily as needed for anxiety.  . Cysteamine Bitartrate (PROCYSBI) 300 MG PACK USE TO CHECK BLOOD SUGARS 4 TIMES A DAY.  Marland Kitchen DROPLET PEN NEEDLES 32G X 4 MM MISC USE  TO INJECT FIVE TIMES DAILY AS DIRECTED  . glucose blood (ACCU-CHEK GUIDE) test strip 1 each by Other route as needed for other. Use as instructed to check blood sugar 3 times daily. DX:E11.65  . heparin sodium, porcine, 1000 UNIT/ML injection Heparin Sodium (Porcine) 1,000 Units/mL Catheter Lock Venous  . hydrocortisone 1 % ointment Apply 1 application topically 2 (two) times daily.  . Hydrocortisone, Perianal, 1 % CREA Apply topically.  . hydrocortisone-pramoxine (PROCTOFOAM-HC) rectal foam Place 1 applicator rectally 2 (two) times daily.  . Insulin Pen Needle 32G X 8 MM MISC Use with insulin x5 times a  day  . insulin regular human CONCENTRATED (HUMULIN R U-500 KWIKPEN) 500 UNIT/ML kwikpen INJECT 50 UNITS UNDER THE SKIN AT BREAKFAST, 70 UNITS AT LUNCH, AND 60 UNITS AT DINNER.  Marland Kitchen loperamide (IMODIUM) 2 MG capsule Take 1 capsule (2 mg total) by mouth as needed for diarrhea or loose stools.  . midodrine (PROAMATINE) 10 MG tablet Take 10 mg by mouth as needed (low blood pressure).   . mupirocin ointment (BACTROBAN) 2 % Apply 1 application topically daily. Apply to dorsal foot wound daily  . omeprazole (  PRILOSEC) 20 MG capsule Take 1 capsule (20 mg total) by mouth daily. Annual appt due in Oct must see provider for future refills  . RENVELA 800 MG tablet Take 2,400 mg by mouth 3 (three) times daily with meals.   . rosuvastatin (CRESTOR) 20 MG tablet Take 1 tablet (20 mg total) by mouth at bedtime. Annual appt due in Oct must see provider for future refills  . sodium zirconium cyclosilicate (LOKELMA) 10 g PACK packet Take by mouth.  . TOUJEO MAX SOLOSTAR 300 UNIT/ML Solostar Pen INJECT 80 UNITS UNDER THE SKIN IN THE MORNING AND 80 UNITS IN THE EVENING  . VELTASSA 8.4 g packet Take 8.4 g by mouth daily.   Marland Kitchen warfarin (COUMADIN) 5 MG tablet TAKE 1 TABLET EVERY DAY OR AS DIRECTED BY COUMADIN CLINIC.     Allergies: Allergies  Allergen Reactions  . Pork-Derived Products Anaphylaxis and Other (See Comments)    Fever also   . Ace Inhibitors Cough  . Hydrocodone Other (See Comments)    Hallucinations  . Pork Allergy     Other reaction(s): Unknown    Social History: The patient  reports that he has never smoked. He has never used smokeless tobacco. He reports that he does not drink alcohol and does not use drugs.   Family History: The patient's family history includes Diabetes in his brother, father, and mother; Hypertension in his mother.   Review of Systems: Please see the history of present illness.   All other systems are reviewed and negative.   Physical Exam: VS:  BP (!) 100/56   Pulse  97   Ht _0  (1.778 m)   Wt 296 lb 6.4 oz (134.4 kg)   SpO2 100%   BMI 42.53 kg/m  .  BMI Body mass index is 42.53 kg/m.  Wt Readings from Last 3 Encounters:  07/24/20 296 lb 6.4 oz (134.4 kg)  03/22/20 285 lb (129.3 kg)  03/08/20 286 lb (129.7 kg)    General: Alert and in no acute distress.  Morbidly obese. He is in a motorized scooter.  Cardiac: Heart tones are distant. No edema.  Respiratory:  Lungs are clear to auscultation bilaterally with normal work of breathing.  GI: Soft and nontender.  MS: No deformity or atrophy. Gait not tested.  Skin: Warm and dry. Color is normal.  Neuro:  Strength and sensation are intact and no gross focal deficits noted.  Psych: Alert, appropriate and with normal affect.   LABORATORY DATA:  EKG:  EKG is ordered today.  Personally reviewed by me. This demonstrates .  Lab Results  Component Value Date   WBC 6.2 10/20/2019   HGB 11.5 (L) 10/20/2019   HCT 36.8 (L) 10/20/2019   PLT 182.0 10/20/2019   GLUCOSE 97 05/13/2020   CHOL 93 05/13/2020   TRIG 113.0 05/13/2020   HDL 27.60 (L) 05/13/2020   LDLCALC 43 05/13/2020   ALT 11 05/13/2020   AST 17 10/20/2019   NA 136 10/20/2019   K 5.3 (H) 10/20/2019   CL 91 (L) 10/20/2019   CREATININE 14.58 (HH) 10/20/2019   BUN 69 (H) 10/20/2019   CO2 23 10/20/2019   TSH 2.518 09/27/2018   PSA 2.15 10/29/2014   INR 2.6 (A) 07/05/2020   HGBA1C 7.5 (H) 05/13/2020   MICROALBUR 232.9 (H) 06/19/2015     BNP (last 3 results) No results for input(s): BNP in the last 8760 hours.  ProBNP (last 3 results) No results for input(s): PROBNP in the last  8760 hours.   Other Studies Reviewed Today:  ECHO IMPRESSIONS 11/2019  1. Left ventricular ejection fraction, by estimation, is 55 to 60%. The  left ventricle has normal function. There is moderately increased left  ventricular hypertrophy. Left ventricular diastolic parameters are  indeterminate.  2. Right ventricular systolic function is normal.  Tricuspid regurgitation  signal is inadequate for assessing RVSP.  3. The mitral valve is abnormal. Mild mitral valve regurgitation.  4. Aortic valve regurgitation is mild.     ASSESSMENT & PLAN:    1. Atypical chest pain - no known CAD but with lots of CV risk factors - he would like to proceed with stress testing - he is not able to walk - will arrange for [facility]. Have advised him to take his PPI every day.   2. Non rheumatic aortic valve insufficiency with prior endocarditis - he never had surgery - his AI is mild - his echo was updated earlier this year.   3. Persistent atrial flutter noted on last several years of EKGs - on Warfarin. Not symptomatic - has normal EF.   4. Chronic diastolic HF - volume is managed by HD.   5. ESRD - on HD.   6. History of HTN - but BP low now that he is on HD.      Current medicines are reviewed with the patient today.  The patient does not have concerns regarding medicines other than what has been noted above.  The following changes have been made:  See above.  Labs/ tests ordered today include:    Orders Placed This Encounter  Procedures  . MYOCARDIAL PERFUSION IMAGING  . EKG 12-Lead     Disposition:   FU with Dr. Angelena Form in December. Further disposition pending.     Patient is agreeable to this plan and will call if any problems develop in the interim.   SignedTruitt Merle, NP  07/24/2020 10:17 AM  [facility] [address] [place], [place]  [address] Phone: [phone] Fax: [phone]

## 2020-07-20 DIAGNOSIS — N2581 Secondary hyperparathyroidism of renal origin: Secondary | ICD-10-CM | POA: Diagnosis not present

## 2020-07-20 DIAGNOSIS — Z992 Dependence on renal dialysis: Secondary | ICD-10-CM | POA: Diagnosis not present

## 2020-07-20 DIAGNOSIS — N186 End stage renal disease: Secondary | ICD-10-CM | POA: Diagnosis not present

## 2020-07-22 ENCOUNTER — Telehealth: Payer: Self-pay | Admitting: Internal Medicine

## 2020-07-22 MED ORDER — CLONAZEPAM 0.5 MG PO TABS: 0.5000 mg | ORAL_TABLET | Freq: Two times a day (BID) | ORAL | 0 refills | Status: DC | PRN

## 2020-07-22 NOTE — Telephone Encounter (Signed)
Notified pt to call the office back regarding the insulin.

## 2020-07-22 NOTE — Telephone Encounter (Signed)
clonazePAM Bobbye [place]) 0.5 MG tablet  [place], [place] Phone:  [phone]  Fax:  [phone]     Last appt: 6.11.21 Next appt: Not scheduled

## 2020-07-22 NOTE — Addendum Note (Signed)
Addended by: Biagio Borg on: 07/22/2020 09:49 PM   Modules accepted: Orders

## 2020-07-23 NOTE — Telephone Encounter (Signed)
Patient returned call re: insulin and requests to be called at ph# [phone].

## 2020-07-23 NOTE — Telephone Encounter (Signed)
Patient states that his insurance will cover the pen and that they are sending to him

## 2020-07-23 NOTE — Telephone Encounter (Signed)
Attempted to contact patient again but no answer

## 2020-07-24 ENCOUNTER — Ambulatory Visit (INDEPENDENT_AMBULATORY_CARE_PROVIDER_SITE_OTHER): Payer: Medicare HMO | Admitting: Nurse Practitioner

## 2020-07-24 ENCOUNTER — Encounter: Payer: Self-pay | Admitting: Nurse Practitioner

## 2020-07-24 ENCOUNTER — Other Ambulatory Visit: Payer: Self-pay

## 2020-07-24 VITALS — BP 100/56 | HR 97 | Ht 70.0 in | Wt 296.4 lb

## 2020-07-24 DIAGNOSIS — Z992 Dependence on renal dialysis: Secondary | ICD-10-CM

## 2020-07-24 DIAGNOSIS — N2581 Secondary hyperparathyroidism of renal origin: Secondary | ICD-10-CM | POA: Diagnosis not present

## 2020-07-24 DIAGNOSIS — T82868A Thrombosis of vascular prosthetic devices, implants and grafts, initial encounter: Secondary | ICD-10-CM | POA: Diagnosis not present

## 2020-07-24 DIAGNOSIS — R079 Chest pain, unspecified: Secondary | ICD-10-CM

## 2020-07-24 DIAGNOSIS — I259 Chronic ischemic heart disease, unspecified: Secondary | ICD-10-CM | POA: Diagnosis not present

## 2020-07-24 DIAGNOSIS — N186 End stage renal disease: Secondary | ICD-10-CM | POA: Diagnosis not present

## 2020-07-24 DIAGNOSIS — I4892 Unspecified atrial flutter: Secondary | ICD-10-CM | POA: Diagnosis not present

## 2020-07-24 DIAGNOSIS — I351 Nonrheumatic aortic (valve) insufficiency: Secondary | ICD-10-CM

## 2020-07-24 DIAGNOSIS — I871 Compression of vein: Secondary | ICD-10-CM | POA: Diagnosis not present

## 2020-07-24 DIAGNOSIS — I5032 Chronic diastolic (congestive) heart failure: Secondary | ICD-10-CM

## 2020-07-24 NOTE — Patient Instructions (Addendum)
After Visit Summary:  We will be checking the following labs today - NONE   Medication Instructions:    Continue with your current medicines. BUT  Go back to taking Omeprazole EVERY day   If you need a refill on your cardiac medications before your next appointment, please call your pharmacy.     Testing/Procedures To Be Arranged:  [facility] are scheduled for a Myocardial Perfusion Imaging Study on ______________________________ at _______________________________.   Please arrive 15 minutes prior to your appointment time for registration and insurance purposes.   The test will take approximately 3 to 4 hours to complete; you may bring reading material. If someone comes with you to your appointment, they will need to remain in the main lobby due to limited space in the testing area.   How to prepare for your Myocardial Perfusion test:   Do not eat or drink 3 hours prior to your test, except you may have water.    Do not consume products containing caffeine (regular or decaffeinated) 12 hours prior to your test (ex: coffee, chocolate, soda, tea)   Do bring a list of your current medications with you. If not listed below, you may take your medications as normal.    Bring any held medication to your appointment, as you may be required to take it once the test is complete.   Do wear comfortable clothes (no dresses or overalls) and walking shoes. Tennis shoes are preferred. No heels or open toed shoes.  Do not wear cologne, perfume, aftershave or lotions (deodorant is allowed).   If these instructions are not followed, you test will have to be rescheduled.   Please report to [address] for your test. If you have questions or concerns about your appointment, please call the Nuclear Lab at [phone].  If you cannot keep your appointment, please provide 24 hour notification to the Nuclear lab to avoid a possible $50 charge to your account.        Follow-Up:   See Dr. Angelena Form in December    At [facility], you and your health needs are our priority.  As part of our continuing mission to provide you with exceptional heart care, we have created designated Provider Care Teams.  These Care Teams include your primary Cardiologist (physician) and Advanced Practice Providers (APPs -  Physician Assistants and Nurse Practitioners) who all work together to provide you with the care you need, when you need it.  Special Instructions:  . Stay safe, wash your hands for at least 20 seconds and wear a mask when needed.  . It was good to talk with you today.    Call the [place] office at [phone] if you have any questions, problems or concerns.

## 2020-07-25 ENCOUNTER — Encounter (HOSPITAL_COMMUNITY): Payer: Self-pay | Admitting: *Deleted

## 2020-07-25 ENCOUNTER — Telehealth (HOSPITAL_COMMUNITY): Payer: Self-pay | Admitting: *Deleted

## 2020-07-25 DIAGNOSIS — N2581 Secondary hyperparathyroidism of renal origin: Secondary | ICD-10-CM | POA: Diagnosis not present

## 2020-07-25 DIAGNOSIS — N186 End stage renal disease: Secondary | ICD-10-CM | POA: Diagnosis not present

## 2020-07-25 DIAGNOSIS — Z992 Dependence on renal dialysis: Secondary | ICD-10-CM | POA: Diagnosis not present

## 2020-07-25 DIAGNOSIS — R6889 Other general symptoms and signs: Secondary | ICD-10-CM | POA: Diagnosis not present

## 2020-07-25 NOTE — Telephone Encounter (Signed)
Left message on voicemail per DPR in reference to upcoming appointment scheduled on 07/31/20 with detailed instructions given per Myocardial Perfusion Study Information Sheet for the test. LM to arrive 15 minutes early, and that it is imperative to arrive on time for appointment to keep from having the test rescheduled. If you need to cancel or reschedule your appointment, please call the office within 24 hours of your appointment. Failure to do so may result in a cancellation of your appointment, and a $50 no show fee. Phone number given for call back for any questions. Kirstie Peri

## 2020-07-26 LAB — PROTIME-INR: INR: 2.4 — AB (ref 0.9–1.1)

## 2020-07-27 ENCOUNTER — Encounter (HOSPITAL_COMMUNITY): Payer: Self-pay | Admitting: Emergency Medicine

## 2020-07-27 ENCOUNTER — Inpatient Hospital Stay (HOSPITAL_COMMUNITY)
Admission: EM | Disposition: A | Discharge status: Home Health | Payer: Self-pay | Source: Home / Self Care | Attending: Cardiovascular Disease

## 2020-07-27 ENCOUNTER — Emergency Department (HOSPITAL_COMMUNITY): Payer: Medicare HMO

## 2020-07-27 ENCOUNTER — Inpatient Hospital Stay (HOSPITAL_COMMUNITY)
Admission: EM | Admit: 2020-07-27 | Discharge: 2020-08-04 | DRG: 246 | Disposition: A | Discharge status: Home Health | Payer: Medicare HMO | Attending: Cardiovascular Disease | Admitting: Cardiovascular Disease

## 2020-07-27 ENCOUNTER — Other Ambulatory Visit: Payer: Self-pay

## 2020-07-27 DIAGNOSIS — I509 Heart failure, unspecified: Secondary | ICD-10-CM | POA: Diagnosis not present

## 2020-07-27 DIAGNOSIS — M47816 Spondylosis without myelopathy or radiculopathy, lumbar region: Secondary | ICD-10-CM | POA: Diagnosis not present

## 2020-07-27 DIAGNOSIS — I5020 Unspecified systolic (congestive) heart failure: Secondary | ICD-10-CM

## 2020-07-27 DIAGNOSIS — N250 Renal osteodystrophy: Secondary | ICD-10-CM | POA: Diagnosis not present

## 2020-07-27 DIAGNOSIS — E11649 Type 2 diabetes mellitus with hypoglycemia without coma: Secondary | ICD-10-CM | POA: Diagnosis not present

## 2020-07-27 DIAGNOSIS — K5900 Constipation, unspecified: Secondary | ICD-10-CM | POA: Diagnosis not present

## 2020-07-27 DIAGNOSIS — I5022 Chronic systolic (congestive) heart failure: Secondary | ICD-10-CM | POA: Diagnosis not present

## 2020-07-27 DIAGNOSIS — R0602 Shortness of breath: Secondary | ICD-10-CM | POA: Diagnosis not present

## 2020-07-27 DIAGNOSIS — I351 Nonrheumatic aortic (valve) insufficiency: Secondary | ICD-10-CM | POA: Diagnosis present

## 2020-07-27 DIAGNOSIS — Z955 Presence of coronary angioplasty implant and graft: Secondary | ICD-10-CM

## 2020-07-27 DIAGNOSIS — R001 Bradycardia, unspecified: Secondary | ICD-10-CM | POA: Diagnosis not present

## 2020-07-27 DIAGNOSIS — Z79899 Other long term (current) drug therapy: Secondary | ICD-10-CM

## 2020-07-27 DIAGNOSIS — E1122 Type 2 diabetes mellitus with diabetic chronic kidney disease: Secondary | ICD-10-CM | POA: Diagnosis present

## 2020-07-27 DIAGNOSIS — E1151 Type 2 diabetes mellitus with diabetic peripheral angiopathy without gangrene: Secondary | ICD-10-CM | POA: Diagnosis present

## 2020-07-27 DIAGNOSIS — I214 Non-ST elevation (NSTEMI) myocardial infarction: Secondary | ICD-10-CM | POA: Diagnosis not present

## 2020-07-27 DIAGNOSIS — M4186 Other forms of scoliosis, lumbar region: Secondary | ICD-10-CM | POA: Diagnosis not present

## 2020-07-27 DIAGNOSIS — N186 End stage renal disease: Secondary | ICD-10-CM

## 2020-07-27 DIAGNOSIS — E46 Unspecified protein-calorie malnutrition: Secondary | ICD-10-CM | POA: Diagnosis present

## 2020-07-27 DIAGNOSIS — I25110 Atherosclerotic heart disease of native coronary artery with unstable angina pectoris: Secondary | ICD-10-CM | POA: Diagnosis not present

## 2020-07-27 DIAGNOSIS — E785 Hyperlipidemia, unspecified: Secondary | ICD-10-CM | POA: Diagnosis present

## 2020-07-27 DIAGNOSIS — J449 Chronic obstructive pulmonary disease, unspecified: Secondary | ICD-10-CM | POA: Diagnosis present

## 2020-07-27 DIAGNOSIS — I959 Hypotension, unspecified: Secondary | ICD-10-CM

## 2020-07-27 DIAGNOSIS — K8000 Calculus of gallbladder with acute cholecystitis without obstruction: Secondary | ICD-10-CM | POA: Diagnosis not present

## 2020-07-27 DIAGNOSIS — A419 Sepsis, unspecified organism: Secondary | ICD-10-CM | POA: Diagnosis not present

## 2020-07-27 DIAGNOSIS — I5043 Acute on chronic combined systolic (congestive) and diastolic (congestive) heart failure: Secondary | ICD-10-CM | POA: Diagnosis present

## 2020-07-27 DIAGNOSIS — R570 Cardiogenic shock: Secondary | ICD-10-CM | POA: Diagnosis not present

## 2020-07-27 DIAGNOSIS — R1013 Epigastric pain: Secondary | ICD-10-CM | POA: Diagnosis not present

## 2020-07-27 DIAGNOSIS — K5909 Other constipation: Secondary | ICD-10-CM | POA: Diagnosis not present

## 2020-07-27 DIAGNOSIS — G4733 Obstructive sleep apnea (adult) (pediatric): Secondary | ICD-10-CM | POA: Diagnosis not present

## 2020-07-27 DIAGNOSIS — K819 Cholecystitis, unspecified: Secondary | ICD-10-CM | POA: Insufficient documentation

## 2020-07-27 DIAGNOSIS — I4891 Unspecified atrial fibrillation: Secondary | ICD-10-CM | POA: Diagnosis present

## 2020-07-27 DIAGNOSIS — I9589 Other hypotension: Secondary | ICD-10-CM | POA: Diagnosis not present

## 2020-07-27 DIAGNOSIS — I10 Essential (primary) hypertension: Secondary | ICD-10-CM | POA: Diagnosis not present

## 2020-07-27 DIAGNOSIS — I4510 Unspecified right bundle-branch block: Secondary | ICD-10-CM | POA: Diagnosis present

## 2020-07-27 DIAGNOSIS — Z6841 Body Mass Index (BMI) 40.0 and over, adult: Secondary | ICD-10-CM

## 2020-07-27 DIAGNOSIS — D631 Anemia in chronic kidney disease: Secondary | ICD-10-CM | POA: Diagnosis present

## 2020-07-27 DIAGNOSIS — I4819 Other persistent atrial fibrillation: Secondary | ICD-10-CM | POA: Diagnosis not present

## 2020-07-27 DIAGNOSIS — T8612 Kidney transplant failure: Secondary | ICD-10-CM | POA: Diagnosis present

## 2020-07-27 DIAGNOSIS — Y830 Surgical operation with transplant of whole organ as the cause of abnormal reaction of the patient, or of later complication, without mention of misadventure at the time of the procedure: Secondary | ICD-10-CM | POA: Diagnosis present

## 2020-07-27 DIAGNOSIS — Z8249 Family history of ischemic heart disease and other diseases of the circulatory system: Secondary | ICD-10-CM

## 2020-07-27 DIAGNOSIS — I517 Cardiomegaly: Secondary | ICD-10-CM | POA: Diagnosis not present

## 2020-07-27 DIAGNOSIS — N2581 Secondary hyperparathyroidism of renal origin: Secondary | ICD-10-CM | POA: Diagnosis present

## 2020-07-27 DIAGNOSIS — I25118 Atherosclerotic heart disease of native coronary artery with other forms of angina pectoris: Secondary | ICD-10-CM | POA: Diagnosis present

## 2020-07-27 DIAGNOSIS — E861 Hypovolemia: Secondary | ICD-10-CM | POA: Diagnosis not present

## 2020-07-27 DIAGNOSIS — R109 Unspecified abdominal pain: Secondary | ICD-10-CM

## 2020-07-27 DIAGNOSIS — Z888 Allergy status to other drugs, medicaments and biological substances status: Secondary | ICD-10-CM

## 2020-07-27 DIAGNOSIS — Z20822 Contact with and (suspected) exposure to covid-19: Secondary | ICD-10-CM | POA: Diagnosis not present

## 2020-07-27 DIAGNOSIS — E559 Vitamin D deficiency, unspecified: Secondary | ICD-10-CM | POA: Diagnosis present

## 2020-07-27 DIAGNOSIS — Z515 Encounter for palliative care: Secondary | ICD-10-CM

## 2020-07-27 DIAGNOSIS — I5023 Acute on chronic systolic (congestive) heart failure: Secondary | ICD-10-CM

## 2020-07-27 DIAGNOSIS — Z833 Family history of diabetes mellitus: Secondary | ICD-10-CM

## 2020-07-27 DIAGNOSIS — I484 Atypical atrial flutter: Secondary | ICD-10-CM | POA: Diagnosis not present

## 2020-07-27 DIAGNOSIS — Z885 Allergy status to narcotic agent status: Secondary | ICD-10-CM

## 2020-07-27 DIAGNOSIS — I82502 Chronic embolism and thrombosis of unspecified deep veins of left lower extremity: Secondary | ICD-10-CM | POA: Diagnosis not present

## 2020-07-27 DIAGNOSIS — I700 Atherosclerosis of aorta: Secondary | ICD-10-CM | POA: Diagnosis not present

## 2020-07-27 DIAGNOSIS — R6889 Other general symptoms and signs: Secondary | ICD-10-CM | POA: Diagnosis not present

## 2020-07-27 DIAGNOSIS — I4430 Unspecified atrioventricular block: Secondary | ICD-10-CM | POA: Diagnosis present

## 2020-07-27 DIAGNOSIS — I2510 Atherosclerotic heart disease of native coronary artery without angina pectoris: Secondary | ICD-10-CM | POA: Diagnosis not present

## 2020-07-27 DIAGNOSIS — I132 Hypertensive heart and chronic kidney disease with heart failure and with stage 5 chronic kidney disease, or end stage renal disease: Secondary | ICD-10-CM | POA: Diagnosis present

## 2020-07-27 DIAGNOSIS — R579 Shock, unspecified: Secondary | ICD-10-CM

## 2020-07-27 DIAGNOSIS — R079 Chest pain, unspecified: Secondary | ICD-10-CM | POA: Diagnosis not present

## 2020-07-27 DIAGNOSIS — K3184 Gastroparesis: Secondary | ICD-10-CM | POA: Diagnosis present

## 2020-07-27 DIAGNOSIS — I213 ST elevation (STEMI) myocardial infarction of unspecified site: Secondary | ICD-10-CM | POA: Diagnosis not present

## 2020-07-27 DIAGNOSIS — E119 Type 2 diabetes mellitus without complications: Secondary | ICD-10-CM

## 2020-07-27 DIAGNOSIS — K429 Umbilical hernia without obstruction or gangrene: Secondary | ICD-10-CM | POA: Diagnosis present

## 2020-07-27 DIAGNOSIS — F419 Anxiety disorder, unspecified: Secondary | ICD-10-CM | POA: Diagnosis present

## 2020-07-27 DIAGNOSIS — E875 Hyperkalemia: Secondary | ICD-10-CM | POA: Diagnosis not present

## 2020-07-27 DIAGNOSIS — Z992 Dependence on renal dialysis: Secondary | ICD-10-CM

## 2020-07-27 DIAGNOSIS — K219 Gastro-esophageal reflux disease without esophagitis: Secondary | ICD-10-CM | POA: Diagnosis present

## 2020-07-27 DIAGNOSIS — R6521 Severe sepsis with septic shock: Secondary | ICD-10-CM | POA: Diagnosis not present

## 2020-07-27 DIAGNOSIS — Z7901 Long term (current) use of anticoagulants: Secondary | ICD-10-CM

## 2020-07-27 DIAGNOSIS — F4310 Post-traumatic stress disorder, unspecified: Secondary | ICD-10-CM | POA: Diagnosis present

## 2020-07-27 DIAGNOSIS — I25119 Atherosclerotic heart disease of native coronary artery with unspecified angina pectoris: Secondary | ICD-10-CM | POA: Diagnosis not present

## 2020-07-27 DIAGNOSIS — Z8679 Personal history of other diseases of the circulatory system: Secondary | ICD-10-CM

## 2020-07-27 DIAGNOSIS — E11319 Type 2 diabetes mellitus with unspecified diabetic retinopathy without macular edema: Secondary | ICD-10-CM | POA: Diagnosis present

## 2020-07-27 DIAGNOSIS — K8020 Calculus of gallbladder without cholecystitis without obstruction: Secondary | ICD-10-CM | POA: Diagnosis not present

## 2020-07-27 DIAGNOSIS — R42 Dizziness and giddiness: Secondary | ICD-10-CM | POA: Diagnosis not present

## 2020-07-27 DIAGNOSIS — Z794 Long term (current) use of insulin: Secondary | ICD-10-CM

## 2020-07-27 DIAGNOSIS — I5021 Acute systolic (congestive) heart failure: Secondary | ICD-10-CM | POA: Diagnosis not present

## 2020-07-27 DIAGNOSIS — Z8719 Personal history of other diseases of the digestive system: Secondary | ICD-10-CM | POA: Diagnosis not present

## 2020-07-27 DIAGNOSIS — E1142 Type 2 diabetes mellitus with diabetic polyneuropathy: Secondary | ICD-10-CM | POA: Diagnosis present

## 2020-07-27 DIAGNOSIS — E1143 Type 2 diabetes mellitus with diabetic autonomic (poly)neuropathy: Secondary | ICD-10-CM | POA: Diagnosis present

## 2020-07-27 DIAGNOSIS — M109 Gout, unspecified: Secondary | ICD-10-CM | POA: Diagnosis present

## 2020-07-27 HISTORY — DX: Bradycardia, unspecified: R00.1

## 2020-07-27 HISTORY — DX: Cholecystitis, unspecified: K81.9

## 2020-07-27 HISTORY — DX: Atherosclerotic heart disease of native coronary artery without angina pectoris: I25.10

## 2020-07-27 HISTORY — PX: LEFT HEART CATH AND CORONARY ANGIOGRAPHY: CATH118249

## 2020-07-27 HISTORY — PX: CORONARY STENT INTERVENTION: CATH118234

## 2020-07-27 HISTORY — DX: Unspecified atrial fibrillation: I48.91

## 2020-07-27 LAB — BASIC METABOLIC PANEL
Anion gap: 15 (ref 5–15)
BUN: 49 mg/dL — ABNORMAL HIGH (ref 6–20)
CO2: 30 mmol/L (ref 22–32)
Calcium: 8.8 mg/dL — ABNORMAL LOW (ref 8.9–10.3)
Chloride: 91 mmol/L — ABNORMAL LOW (ref 98–111)
Creatinine, Ser: 9.2 mg/dL — ABNORMAL HIGH (ref 0.61–1.24)
GFR, Estimated: 6 mL/min — ABNORMAL LOW (ref >60)
Glucose, Bld: 148 mg/dL — ABNORMAL HIGH (ref 70–99)
Potassium: 4.3 mmol/L (ref 3.5–5.1)
Sodium: 136 mmol/L (ref 135–145)

## 2020-07-27 LAB — RESPIRATORY PANEL BY RT PCR (FLU A&B, COVID)
Influenza A by PCR: NEGATIVE
Influenza B by PCR: NEGATIVE
SARS Coronavirus 2 by RT PCR: NEGATIVE

## 2020-07-27 LAB — POCT ACTIVATED CLOTTING TIME
Activated Clotting Time: 219 seconds
Activated Clotting Time: 180 seconds
Activated Clotting Time: 169 seconds

## 2020-07-27 LAB — PROTIME-INR
INR: 1.8 — ABNORMAL HIGH (ref 0.8–1.2)
Prothrombin Time: 20.4 seconds — ABNORMAL HIGH (ref 11.4–15.2)

## 2020-07-27 LAB — CBC
HCT: 39.9 % (ref 39.0–52.0)
Hemoglobin: 12 g/dL — ABNORMAL LOW (ref 13.0–17.0)
MCH: 29.6 pg (ref 26.0–34.0)
MCHC: 30.1 g/dL (ref 30.0–36.0)
MCV: 98.5 fL (ref 80.0–100.0)
Platelets: 247 10*3/uL (ref 150–400)
RBC: 4.05 MIL/uL — ABNORMAL LOW (ref 4.22–5.81)
RDW: 18.5 % — ABNORMAL HIGH (ref 11.5–15.5)
WBC: 8.4 10*3/uL (ref 4.0–10.5)
nRBC: 0 % (ref 0.0–0.2)

## 2020-07-27 LAB — TROPONIN I (HIGH SENSITIVITY)
Troponin I (High Sensitivity): >27000 ng/L (ref <18)
Troponin I (High Sensitivity): >27000 ng/L (ref <18)

## 2020-07-27 LAB — GLUCOSE, CAPILLARY
Glucose-Capillary: 86 mg/dL (ref 70–99)
Glucose-Capillary: 292 mg/dL — ABNORMAL HIGH (ref 70–99)

## 2020-07-27 SURGERY — LEFT HEART CATH AND CORONARY ANGIOGRAPHY
Anesthesia: LOCAL

## 2020-07-27 MED ORDER — MIDAZOLAM HCL 2 MG/2ML IJ SOLN
INTRAMUSCULAR | Status: AC
  Filled 2020-07-27: qty 2

## 2020-07-27 MED ORDER — BIVALIRUDIN BOLUS VIA INFUSION - CUPID
INTRAVENOUS | Status: DC | PRN
  Administered 2020-07-27: 38.7 mg via INTRAVENOUS

## 2020-07-27 MED ORDER — INSULIN ASPART 100 UNIT/ML ~~LOC~~ SOLN
0.0000 [IU] | Freq: Every day | SUBCUTANEOUS | Status: DC
  Administered 2020-07-28: 3 [IU] via SUBCUTANEOUS
  Administered 2020-08-02: 2 [IU] via SUBCUTANEOUS

## 2020-07-27 MED ORDER — HEPARIN (PORCINE) IN NACL 1000-0.9 UT/500ML-% IV SOLN
INTRAVENOUS | Status: DC | PRN
  Administered 2020-07-27 (×2): 500 mL

## 2020-07-27 MED ORDER — ATROPINE SULFATE 1 MG/10ML IJ SOSY
PREFILLED_SYRINGE | INTRAMUSCULAR | Status: AC
  Filled 2020-07-27: qty 10

## 2020-07-27 MED ORDER — MIDAZOLAM HCL 2 MG/2ML IJ SOLN
INTRAMUSCULAR | Status: DC | PRN
  Administered 2020-07-27: 1 mg via INTRAVENOUS

## 2020-07-27 MED ORDER — HEPARIN SODIUM (PORCINE) 1000 UNIT/ML IJ SOLN
INTRAMUSCULAR | Status: AC
  Filled 2020-07-27: qty 1

## 2020-07-27 MED ORDER — SODIUM CHLORIDE 0.9 % IV SOLN: 250.0000 mL | INTRAVENOUS | Status: DC | PRN

## 2020-07-27 MED ORDER — CLOPIDOGREL BISULFATE 300 MG PO TABS
ORAL_TABLET | ORAL | Status: DC | PRN
  Administered 2020-07-27: 600 mg via ORAL

## 2020-07-27 MED ORDER — CLOPIDOGREL BISULFATE 75 MG PO TABS
75.0000 mg | ORAL_TABLET | Freq: Every day | ORAL | Status: DC
  Administered 2020-07-28 – 2020-08-04 (×8): 75 mg via ORAL
  Filled 2020-07-27 (×8): qty 1

## 2020-07-27 MED ORDER — FENTANYL CITRATE (PF) 100 MCG/2ML IJ SOLN
12.5000 ug | Freq: Once | INTRAMUSCULAR | Status: AC
  Administered 2020-07-27: 12.5 ug via INTRAVENOUS
  Filled 2020-07-27: qty 2

## 2020-07-27 MED ORDER — IOHEXOL 350 MG/ML SOLN
INTRAVENOUS | Status: DC | PRN
  Administered 2020-07-27: 200 mL via INTRA_ARTERIAL

## 2020-07-27 MED ORDER — SODIUM CHLORIDE 0.9% FLUSH: 3.0000 mL | INTRAVENOUS | Status: DC | PRN

## 2020-07-27 MED ORDER — IOHEXOL 350 MG/ML SOLN
INTRAVENOUS | Status: AC
  Filled 2020-07-27: qty 1

## 2020-07-27 MED ORDER — INSULIN GLARGINE (2 UNIT DIAL) 300 UNIT/ML ~~LOC~~ SOPN: 80.0000 [IU] | PEN_INJECTOR | SUBCUTANEOUS | Status: DC

## 2020-07-27 MED ORDER — CLOPIDOGREL BISULFATE 300 MG PO TABS
ORAL_TABLET | ORAL | Status: AC
  Filled 2020-07-27: qty 2

## 2020-07-27 MED ORDER — LIDOCAINE HCL (PF) 1 % IJ SOLN
INTRAMUSCULAR | Status: AC
  Filled 2020-07-27: qty 30

## 2020-07-27 MED ORDER — LIDOCAINE HCL (PF) 1 % IJ SOLN
INTRAMUSCULAR | Status: DC | PRN
  Administered 2020-07-27: 15 mL via SUBCUTANEOUS

## 2020-07-27 MED ORDER — INSULIN ASPART 100 UNIT/ML ~~LOC~~ SOLN
0.0000 [IU] | Freq: Three times a day (TID) | SUBCUTANEOUS | Status: DC
  Administered 2020-07-27: 8 [IU] via SUBCUTANEOUS
  Administered 2020-07-28: 15 [IU] via SUBCUTANEOUS
  Administered 2020-07-28: 2 [IU] via SUBCUTANEOUS
  Administered 2020-07-28: 11 [IU] via SUBCUTANEOUS
  Administered 2020-07-29: 8 [IU] via SUBCUTANEOUS
  Administered 2020-07-30 – 2020-08-01 (×2): 2 [IU] via SUBCUTANEOUS
  Administered 2020-08-02 (×2): 5 [IU] via SUBCUTANEOUS
  Administered 2020-08-03: 3 [IU] via SUBCUTANEOUS
  Administered 2020-08-03: 15 [IU] via SUBCUTANEOUS
  Administered 2020-08-04 (×2): 8 [IU] via SUBCUTANEOUS

## 2020-07-27 MED ORDER — HEPARIN (PORCINE) IN NACL 1000-0.9 UT/500ML-% IV SOLN
INTRAVENOUS | Status: AC
  Filled 2020-07-27: qty 1000

## 2020-07-27 MED ORDER — HYDRALAZINE HCL 20 MG/ML IJ SOLN: 10.0000 mg | INTRAMUSCULAR | Status: AC | PRN

## 2020-07-27 MED ORDER — CLONAZEPAM 0.5 MG PO TABS
0.5000 mg | ORAL_TABLET | Freq: Two times a day (BID) | ORAL | Status: DC | PRN
  Administered 2020-07-27 – 2020-08-03 (×7): 0.5 mg via ORAL
  Filled 2020-07-27 (×7): qty 1

## 2020-07-27 MED ORDER — BIVALIRUDIN TRIFLUOROACETATE 250 MG IV SOLR
INTRAVENOUS | Status: AC
  Filled 2020-07-27: qty 250

## 2020-07-27 MED ORDER — SODIUM CHLORIDE 0.9 % IV SOLN
INTRAVENOUS | Status: AC | PRN
  Administered 2020-07-27: 10 mL/h via INTRAVENOUS

## 2020-07-27 MED ORDER — FENTANYL CITRATE (PF) 100 MCG/2ML IJ SOLN
INTRAMUSCULAR | Status: DC | PRN
  Administered 2020-07-27: 25 ug via INTRAVENOUS

## 2020-07-27 MED ORDER — INSULIN REGULAR HUMAN (CONC) 500 UNIT/ML ~~LOC~~ SOPN: 50.0000 [IU] | PEN_INJECTOR | Freq: Three times a day (TID) | SUBCUTANEOUS | Status: DC

## 2020-07-27 MED ORDER — SODIUM CHLORIDE 0.9 % IV BOLUS
500.0000 mL | Freq: Once | INTRAVENOUS | Status: AC
  Administered 2020-07-27: 500 mL via INTRAVENOUS

## 2020-07-27 MED ORDER — SODIUM CHLORIDE 0.9% FLUSH
3.0000 mL | Freq: Two times a day (BID) | INTRAVENOUS | Status: DC
  Administered 2020-07-27 – 2020-08-04 (×13): 3 mL via INTRAVENOUS

## 2020-07-27 MED ORDER — PATIROMER SORBITEX CALCIUM 8.4 G PO PACK
8.4000 g | PACK | Freq: Every day | ORAL | Status: DC
  Administered 2020-07-29 – 2020-08-04 (×5): 8.4 g via ORAL
  Filled 2020-07-27 (×12): qty 1

## 2020-07-27 MED ORDER — SEVELAMER CARBONATE 800 MG PO TABS
2400.0000 mg | ORAL_TABLET | Freq: Three times a day (TID) | ORAL | Status: DC
  Administered 2020-07-28 – 2020-08-04 (×17): 2400 mg via ORAL
  Filled 2020-07-27 (×18): qty 3

## 2020-07-27 MED ORDER — ROSUVASTATIN CALCIUM 20 MG PO TABS
40.0000 mg | ORAL_TABLET | Freq: Every day | ORAL | Status: DC
  Administered 2020-07-27 – 2020-07-28 (×2): 40 mg via ORAL
  Filled 2020-07-27: qty 2
  Filled 2020-07-27 (×2): qty 1
  Filled 2020-07-27: qty 2

## 2020-07-27 MED ORDER — LABETALOL HCL 5 MG/ML IV SOLN
10.0000 mg | INTRAVENOUS | Status: AC | PRN
  Administered 2020-07-27: 10 mg via INTRAVENOUS
  Filled 2020-07-27: qty 4

## 2020-07-27 MED ORDER — METHYLPREDNISOLONE SODIUM SUCC 125 MG IJ SOLR
INTRAMUSCULAR | Status: AC
  Filled 2020-07-27: qty 2

## 2020-07-27 MED ORDER — INSULIN REGULAR HUMAN (CONC) 500 UNIT/ML ~~LOC~~ SOPN
50.0000 [IU] | PEN_INJECTOR | Freq: Three times a day (TID) | SUBCUTANEOUS | Status: DC
  Administered 2020-07-28 – 2020-07-29 (×3): 50 [IU] via SUBCUTANEOUS
  Filled 2020-07-27 (×3): qty 3

## 2020-07-27 MED ORDER — INSULIN GLARGINE 100 UNIT/ML ~~LOC~~ SOLN
80.0000 [IU] | Freq: Two times a day (BID) | SUBCUTANEOUS | Status: DC
  Administered 2020-07-28 – 2020-07-29 (×2): 80 [IU] via SUBCUTANEOUS
  Filled 2020-07-27 (×5): qty 0.8

## 2020-07-27 MED ORDER — ASPIRIN 81 MG PO CHEW
81.0000 mg | CHEWABLE_TABLET | Freq: Every day | ORAL | Status: DC
  Administered 2020-07-28 – 2020-08-04 (×7): 81 mg via ORAL
  Filled 2020-07-27 (×7): qty 1

## 2020-07-27 MED ORDER — ACETAMINOPHEN 325 MG PO TABS
650.0000 mg | ORAL_TABLET | ORAL | Status: DC | PRN
  Administered 2020-07-28 (×2): 650 mg via ORAL
  Filled 2020-07-27 (×3): qty 2

## 2020-07-27 MED ORDER — FENTANYL CITRATE (PF) 100 MCG/2ML IJ SOLN
INTRAMUSCULAR | Status: AC
  Filled 2020-07-27: qty 2

## 2020-07-27 MED ORDER — FERRIC CITRATE 1 GM 210 MG(FE) PO TABS
210.0000 mg | ORAL_TABLET | Freq: Three times a day (TID) | ORAL | Status: DC
  Administered 2020-07-28 – 2020-08-04 (×17): 210 mg via ORAL
  Filled 2020-07-27 (×26): qty 1

## 2020-07-27 MED ORDER — SODIUM CHLORIDE 0.9 % IV SOLN
INTRAVENOUS | Status: DC | PRN
  Administered 2020-07-27: 0.25 mg/kg/h via INTRAVENOUS

## 2020-07-27 MED ORDER — PANTOPRAZOLE SODIUM 40 MG PO TBEC
40.0000 mg | DELAYED_RELEASE_TABLET | Freq: Every day | ORAL | Status: DC
  Administered 2020-07-28 – 2020-08-04 (×7): 40 mg via ORAL
  Filled 2020-07-27 (×7): qty 1

## 2020-07-27 MED ORDER — NITROGLYCERIN 1 MG/10 ML FOR IR/CATH LAB
INTRA_ARTERIAL | Status: AC
  Filled 2020-07-27: qty 10

## 2020-07-27 MED ORDER — METHYLPREDNISOLONE SODIUM SUCC 125 MG IJ SOLR
INTRAMUSCULAR | Status: DC | PRN
  Administered 2020-07-27: 125 mg via INTRAVENOUS

## 2020-07-27 SURGICAL SUPPLY — 18 items
BALLN SAPPHIRE 2.5X12 (BALLOONS) ×2
BALLN SAPPHIRE ~~LOC~~ 3.75X12 (BALLOONS) ×2 IMPLANT
BALLOON SAPPHIRE 2.5X12 (BALLOONS) ×1 IMPLANT
CATH INFINITI 5FR MULTPACK ANG (CATHETERS) ×2 IMPLANT
CATH VISTA GUIDE 6FR XBLAD3.5 (CATHETERS) ×2 IMPLANT
ELECT DEFIB PAD ADLT CADENCE (PAD) ×2 IMPLANT
HOVERMATT SINGLE USE (MISCELLANEOUS) ×2 IMPLANT
KIT ENCORE 26 ADVANTAGE (KITS) ×2 IMPLANT
KIT HEART LEFT (KITS) ×2 IMPLANT
KIT MICROINTRODUCER 5F 7206 (SHEATH) ×2 IMPLANT
PACK CARDIAC CATHETERIZATION (CUSTOM PROCEDURE TRAY) ×2 IMPLANT
SHEATH PINNACLE 6F 10CM (SHEATH) ×2 IMPLANT
SHEATH PROBE COVER 6X72 (BAG) ×2 IMPLANT
STENT RESOLUTE ONYX 3.5X18 (Permanent Stent) ×2 IMPLANT
TRANSDUCER W/STOPCOCK (MISCELLANEOUS) ×2 IMPLANT
TUBING CIL FLEX 10 FLL-RA (TUBING) ×2 IMPLANT
WIRE COUGAR XT STRL 190CM (WIRE) ×2 IMPLANT
WIRE EMERALD 3MM-J .035X150CM (WIRE) ×2 IMPLANT

## 2020-07-27 NOTE — H&P (Signed)
ADMISSION HISTORY & PHYSICAL  Patient Name: [name] [name] Date of Encounter: 07/27/2020 Primary Care Physician: Hoyt Koch, MD Cardiologist: Lauree Chandler, MD  Chief Complaint   Chest pain  Patient Profile   54 yo male with ESRD on dialysis, COPD, AI, CHF, history of endocarditis, presents with chest pain in afib with RVR - found to have NSTEMI.  HPI   This is a 54 y.o. male with a past medical history significant for ESRD on dialysis, COPD, AI, CHF, history of endocarditis, presents with chest pain in afib with RVR - presents for evaluation of chest pain.  This is a patient of Dr. Angelena Form who was recently seen in the office by Truitt Merle, NP, for chest pain of uncertain etiology.  He has a history as described above including chronic diastolic heart failure, atrial tach/flutter, failed renal transplant in 2009, and endocarditis in 2016 with the recommendation for aortic valve replacement which was declined by the patient.  He has a history of PAD and prior DVT in the past on warfarin.  In the office 3 days ago he was describing some chest pressure.  He said it is with stress sometimes but also with eating.  He says he has to lay down or sit down to help with his symptoms.  He said is been going on for several weeks.  He reports his balance is very poor.  He is apparently not vaccinated for COVID-19.  He was actually asking for cardiac testing.  His chest pain was felt to be atypical but with multiple risk factors a stress test was ordered but has not yet been performed.  On presentation today he was thought to have ST elevation however the EKG was evaluated by Dr. Angelena Form and thought not to be a STEMI.  Ultimately general cardiology was contacted and I was asked to see the patient.  At that point none of his lab work was yet back.  Subsequently his troponin resulted back at greater than 27,000.  I then went to see the patient and he was noted to be complaining of  intermittent chest pain which had not yet resolved.  He had not yet been started on heparin.  I personally reviewed his EKG which shows A. fib and an unusual right bundle branch block with possible ST segment elevation in the septal leads and aVR,?  Posterior or posterior lateral infarct.  PMHx   Past Medical History:  Diagnosis Date  . Allergic rhinitis   . Allergy   . Anemia   . Aortic insufficiency    a. Echo 7/16:  Mod LVH, EF 50-55%, mild to mod AI, severe LAE, PASP 57 mmHg (LV ID end diastolic 61.6 mm);  b. Echo 2/17:  Mod LVH, EF 50-55%, mod AI, MAC, mild MR, mod LAE, mild to mod TR, PASP 63 mmHg  . Bilateral carpal tunnel syndrome   . Cataract    removed both eyes   . CHF (congestive heart failure) ([place])   . Claustrophobia   . COPD (chronic obstructive pulmonary disease) ([place])   . Diabetes mellitus    Type II  . Diabetic peripheral neuropathy ([place])   . Diabetic retinopathy ([place])   . Discitis of lumbar region    resolved  . DVT (deep venous thrombosis) ([place]) 10/07/2015   LLE  . Endocarditis 11/28/14  . ESRD (end stage renal disease) on dialysis [facility])    s/p renal transplant in 2009, failed in 2016  . Gait disorder   .  Gastroparesis   . GERD (gastroesophageal reflux disease)   . Gout   . Hearing difficulty   . Hemodialysis patient ([place])    tuesday, thursday, saturday   . Hyperlipidemia   . Hypertension   . Incomplete bladder emptying   . Leg pain 02/05/11   with walking  . Morbid obesity ([place])   . Obstructive sleep apnea    not using CPAP- lost 100lbs  . Osteomyelitis ([place])   . Posttraumatic stress disorder   . Rotator cuff disorder   . Secondary hyperparathyroidism ([place])   . Sepsis ([place])    Postive blood cultures -   . Sleep apnea    no cpap  . Urethral stricture   . Vitamin D deficiency     Past Surgical History:  Procedure Laterality Date  . ARTERIOVENOUS GRAFT PLACEMENT Bilateral "several"  . AV FISTULA PLACEMENT Bilateral 2015  . AV FISTULA  PLACEMENT Right 06/13/2019   Procedure: INSERTION OF ARTERIOVENOUS (AV) GORE-TEX GRAFT RIGHT  ARM;  Surgeon: Elam Dutch, MD;  Location: [place];  Service: Vascular;  Laterality: Right;  . BASCILIC VEIN TRANSPOSITION Right 09/27/2015  . [place] Right 09/27/2015   Procedure: BASILIC VEIN TRANSPOSITION right;  Surgeon: Mal Misty, MD;  Location: [place];  Service: Vascular;  Laterality: Right;  . CATARACT EXTRACTION W/ INTRAOCULAR LENS  IMPLANT, BILATERAL Bilateral   . CYSTOSCOPY W/ INTERNAL URETHROTOMY  09/2014  . IR THROMBECTOMY AV FISTULA W/THROMBOLYSIS/PTA INC/SHUNT/IMG RIGHT Right 08/01/2019  . IR US GUIDE VASC ACCESS RIGHT  08/01/2019  . KIDNEY TRANSPLANT  2009  . SIGMOIDOSCOPY    . UPPER EXTREMITY VENOGRAPHY Bilateral 06/02/2019   Procedure: UPPER EXTREMITY VENOGRAPHY;  Surgeon: Elam Dutch, MD;  Location: [place] [facility];  Service: Cardiovascular;  Laterality: Bilateral;  . UPPER GASTROINTESTINAL ENDOSCOPY      FAMHx   Family History  Problem Relation Age of Onset  . Hypertension Mother   . Diabetes Mother   . Diabetes Father   . Diabetes Brother   . Colon cancer Neg Hx   . Colon polyps Neg Hx   . Esophageal cancer Neg Hx   . Rectal cancer Neg Hx   . Stomach cancer Neg Hx     SOCHx    reports that he has never smoked. He has never used smokeless tobacco. He reports that he does not drink alcohol and does not use drugs.  Outpatient Medications   No current facility-administered medications on file prior to encounter.   Current Outpatient Medications on File Prior to Encounter  Medication Sig Dispense Refill  . Accu-Chek FastClix Lancets MISC USE TO CHECK BLOOD SUGARS 4 TIMES A DAY. (Patient taking differently: 1 each by Other route See admin instructions. USE TO CHECK BLOOD SUGARS 4 TIMES A DAY.) 360 each 4  . allopurinol (ZYLOPRIM) 100 MG tablet Take 1 tablet (100 mg total) by mouth 3 (three) times a week. After dialysis 30 tablet 6  .  AURYXIA 1 GM 210 MG(Fe) tablet Take 210 mg by mouth 3 (three) times daily with meals.     . calcium acetate (PHOSLO) 667 MG capsule Take 3 capsules (2,001 mg total) by mouth 3 (three) times daily with meals. 270 capsule 0  . clonazePAM (KLONOPIN) 0.5 MG tablet Take 1 tablet (0.5 mg total) by mouth 2 (two) times daily as needed for anxiety. 60 tablet 0  . DROPLET PEN NEEDLES 32G X 4 MM MISC USE  TO INJECT FIVE TIMES DAILY AS DIRECTED (  Patient taking differently: 1 each by Other route See admin instructions. Use to inject 5 times daily as directed.) 500 each 1  . glucose blood (ACCU-CHEK GUIDE) test strip 1 each by Other route as needed for other. Use as instructed to check blood sugar 3 times daily. DX:E11.65 300 each 1  . hydrocortisone 1 % ointment Apply 1 application topically 2 (two) times daily. 30 g 0  . hydrocortisone-pramoxine (PROCTOFOAM-HC) rectal foam Place 1 applicator rectally 2 (two) times daily. 30 g 0  . Insulin Pen Needle 32G X 8 MM MISC Use with insulin x5 times a day (Patient taking differently: 1 each by Other route See admin instructions. Use with insulin x5 times a day) 500 each 1  . insulin regular human CONCENTRATED (HUMULIN R U-500 KWIKPEN) 500 UNIT/ML kwikpen INJECT 50 UNITS UNDER THE SKIN AT BREAKFAST, 70 UNITS AT LUNCH, AND 60 UNITS AT DINNER. (Patient taking differently: Inject 50-70 Units into the skin See admin instructions. INJECT 50 UNITS UNDER THE SKIN AT BREAKFAST, 70 UNITS AT LUNCH, AND 60 UNITS AT DINNER.) 12 mL 0  . omeprazole (PRILOSEC) 20 MG capsule Take 1 capsule (20 mg total) by mouth daily. Annual appt due in Oct must see provider for future refills 90 capsule 1  . RENVELA 800 MG tablet Take 2,400 mg by mouth 3 (three) times daily with meals.     . rosuvastatin (CRESTOR) 20 MG tablet Take 1 tablet (20 mg total) by mouth at bedtime. Annual appt due in Oct must see provider for future refills 90 tablet 1  . TOUJEO MAX SOLOSTAR 300 UNIT/ML Solostar Pen INJECT 80  UNITS UNDER THE SKIN IN THE MORNING AND 80 UNITS IN THE EVENING (Patient taking differently: Inject 80 Units into the skin See admin instructions. INJECT 80 UNITS UNDER THE SKIN IN THE MORNING AND 80 UNITS IN THE EVENING) 48 mL 1  . VELTASSA 8.4 g packet Take 8.4 g by mouth daily.     Marland Kitchen warfarin (COUMADIN) 5 MG tablet TAKE 1 TABLET EVERY DAY OR AS DIRECTED BY COUMADIN CLINIC. (Patient taking differently: Take 5 mg by mouth See admin instructions. TAKE 1 TABLET EVERY DAY OR AS DIRECTED BY COUMADIN CLINIC.) 100 tablet 0  . busPIRone (BUSPAR) 5 MG tablet TAKE 1 TABLET (5 MG TOTAL) BY MOUTH 2 (TWO) TIMES DAILY AS NEEDED. (Patient not taking: Reported on 07/27/2020) 180 tablet 1  . loperamide (IMODIUM) 2 MG capsule Take 1 capsule (2 mg total) by mouth as needed for diarrhea or loose stools. (Patient not taking: Reported on 07/27/2020) 30 capsule 0  . mupirocin ointment (BACTROBAN) 2 % Apply 1 application topically daily. Apply to dorsal foot wound daily (Patient not taking: Reported on 07/27/2020) 30 g 2  . sodium zirconium cyclosilicate (LOKELMA) 10 g PACK packet Take 10 g by mouth daily.       Inpatient Medications    Scheduled Meds:   Continuous Infusions:   PRN Meds:    ALLERGIES   Allergies  Allergen Reactions  . Pork-Derived Products Anaphylaxis and Other (See Comments)    Fever also   . Ace Inhibitors Cough  . Hydrocodone Other (See Comments)    Hallucinations  . Pork Allergy     Other reaction(s): Unknown    ROS   Pertinent items noted in HPI and remainder of comprehensive ROS otherwise negative.  Vitals   Vitals:   07/27/20 1245 07/27/20 1315 07/27/20 1400 07/27/20 1415  BP: 102/67 (!) 101/59 106/71 113/80  Pulse: Marland Kitchen)  102 (!) 101 100 (!) 104  Resp: _0 Temp:      TempSrc:      SpO2: 97% 97% 100% 95%  Weight:      Height:        Intake/Output Summary (Last 24 hours) at 07/27/2020 1429 Last data filed at 07/27/2020 1223 Gross per 24 hour  Intake 500 ml   Output --  Net 500 ml   Filed Weights   07/27/20 1150  Weight: 129 kg    Physical Exam   General appearance: alert, no distress and morbidly obese Neck: no carotid bruit, no JVD and thyroid not enlarged, symmetric, no tenderness/mass/nodules Lungs: diminished breath sounds bibasilar Heart: irregularly irregular rhythm and systolic murmur: systolic ejection 2/6, crescendo at 2nd right intercostal space Abdomen: soft, non-tender; bowel sounds normal; no masses,  no organomegaly Extremities: edema Trace bilateral pedal, left upper arm fistula Pulses: 2+ and symmetric Skin: Skin color, texture, turgor normal. No rashes or lesions Neurologic: Grossly normal Psych: Pleasant  Labs   Results for orders placed or performed during the hospital encounter of 07/27/20 (from the past 48 hour(s))  Basic metabolic panel     Status: Abnormal   Collection Time: 07/27/20 11:22 AM  Result Value Ref Range   Sodium 136 135 - 145 mmol/L   Potassium 4.3 3.5 - 5.1 mmol/L   Chloride 91 (L) 98 - 111 mmol/L   CO2 30 22 - 32 mmol/L   Glucose, Bld 148 (H) 70 - 99 mg/dL    Comment: Glucose reference range applies only to samples taken after fasting for at least 8 hours.   BUN 49 (H) 6 - 20 mg/dL   Creatinine, Ser 9.20 (H) 0.61 - 1.24 mg/dL   Calcium 8.8 (L) 8.9 - 10.3 mg/dL   GFR, Estimated 6 (L) >60 mL/min   Anion gap 15 5 - 15    Comment: Performed at [facility] [address], [place], [place] [address]  CBC     Status: Abnormal   Collection Time: 07/27/20 11:22 AM  Result Value Ref Range   WBC 8.4 4.0 - 10.5 K/uL   RBC 4.05 (L) 4.22 - 5.81 MIL/uL   Hemoglobin 12.0 (L) 13.0 - 17.0 g/dL   HCT 39.9 39 - 52 %   MCV 98.5 80.0 - 100.0 fL   MCH 29.6 26.0 - 34.0 pg   MCHC 30.1 30.0 - 36.0 g/dL   RDW 18.5 (H) 11.5 - 15.5 %   Platelets 247 150 - 400 K/uL   nRBC 0.0 0.0 - 0.2 %    Comment: Performed at [facility], [place] [address], [place], [place] [address]  Troponin I (High  Sensitivity)     Status: Abnormal   Collection Time: 07/27/20 11:22 AM  Result Value Ref Range   Troponin I (High Sensitivity) >27,000 (HH) <18 ng/L    Comment: RESULT CONFIRMED BY AUTOMATED DILUTION CRITICAL RESULT CALLED TO, READ BACK BY AND VERIFIED WITH: RN T SHROPSHIRE AT 1257 07/27/20 BY L BENFIELD (NOTE) Elevated high sensitivity troponin I (hsTnI) values and significant  changes across serial measurements may suggest ACS but many other  chronic and acute conditions are known to elevate hsTnI results.  Refer to the Links section for chest pain algorithms and additional  guidance. Performed at [facility], [place] [address], [place], [place] [address]   Respiratory Panel by RT PCR (Flu A&B, Covid) - Nasopharyngeal Swab     Status:  None   Collection Time: 07/27/20 12:01 PM   Specimen: Nasopharyngeal Swab  Result Value Ref Range   SARS Coronavirus 2 by RT PCR NEGATIVE NEGATIVE    Comment: (NOTE) SARS-CoV-2 target nucleic acids are NOT DETECTED.  The SARS-CoV-2 RNA is generally detectable in upper respiratoy specimens during the acute phase of infection. The lowest concentration of SARS-CoV-2 viral copies this assay can detect is 131 copies/mL. A negative result does not preclude SARS-Cov-2 infection and should not be used as the sole basis for treatment or other patient management decisions. A negative result may occur with  improper specimen collection/handling, submission of specimen other than nasopharyngeal swab, presence of viral mutation(s) within the areas targeted by this assay, and inadequate number of viral copies (<131 copies/mL). A negative result must be combined with clinical observations, patient history, and epidemiological information. The expected result is Negative.  Fact Sheet for Patients:  PinkCheek.be  Fact Sheet for Healthcare Providers:  GravelBags.it  This test is no t yet approved or  cleared by the [place] FDA and  has been authorized for detection and/or diagnosis of SARS-CoV-2 by FDA under an Emergency Use Authorization (EUA). This EUA will remain  in effect (meaning this test can be used) for the duration of the COVID-19 declaration under Section 564(b)(1) of the Act, 21 U.S.C. section 360bbb-3(b)(1), unless the authorization is terminated or revoked sooner.     Influenza A by PCR NEGATIVE NEGATIVE   Influenza B by PCR NEGATIVE NEGATIVE    Comment: (NOTE) The Xpert Xpress SARS-CoV-2/FLU/RSV assay is intended as an aid in  the diagnosis of influenza from Nasopharyngeal swab specimens and  should not be used as a sole basis for treatment. Nasal washings and  aspirates are unacceptable for Xpert Xpress SARS-CoV-2/FLU/RSV  testing.  Fact Sheet for Patients: PinkCheek.be  Fact Sheet for Healthcare Providers: GravelBags.it  This test is not yet approved or cleared by the [place] FDA and  has been authorized for detection and/or diagnosis of SARS-CoV-2 by  FDA under an Emergency Use Authorization (EUA). This EUA will remain  in effect (meaning this test can be used) for the duration of the  Covid-19 declaration under Section 564(b)(1) of the Act, 21  U.S.C. section 360bbb-3(b)(1), unless the authorization is  terminated or revoked. Performed at [facility], [place] [address], [place], [place] [address]   Protime-INR     Status: Abnormal   Collection Time: 07/27/20  1:20 PM  Result Value Ref Range   Prothrombin Time 20.4 (H) 11.4 - 15.2 seconds   INR 1.8 (H) 0.8 - 1.2    Comment: (NOTE) INR goal varies based on device and disease states. Performed at [facility], [place] [address] [place], [place] [address]    *Note: Due to a large number of results and/or encounters for the requested time period, some results have not been displayed. A complete set of results can be found in  Results Review.    ECG   A. fib with RVR at 103, RBBB, ST elevation in V1 and V2/AVR concerning for possible inferioposterior infarct- Personally Reviewed  Telemetry   A. fib- Personally Reviewed  Radiology   DG Chest Portable 1 View  Result Date: 07/27/2020 CLINICAL DATA:  Chest pain and shortness of breath EXAM: PORTABLE CHEST 1 VIEW COMPARISON:  April 11, 2018 FINDINGS: There is ill-defined airspace opacity in the left base. Lungs are otherwise clear. There is cardiomegaly with pulmonary vascularity within normal limits. No adenopathy.  No bone lesions. No pneumothorax IMPRESSION: Opacity left base concerning for potential pneumonia. Lungs otherwise clear. There is cardiac enlargement. No adenopathy appreciable. Electronically Signed   By: Lowella Grip III M.D.   On: 07/27/2020 12:30    Cardiac Studies   N/A  Assessment   Principal Problem:   NSTEMI (non-ST elevated myocardial infarction) ([place]) Active Problems:   Essential hypertension   OSA (obstructive sleep apnea)   Severe obesity (BMI >= 40) (H[facility])   CHF (congestive heart failure) ([facility])   Chronic deep vein thrombosis (DVT) of left lower extremity ([facility])   ESRD on dialysis [facility])   Atrial fibrillation ([place])   Plan   1. NSTEMI -EKG was compared to a prior EKG 3 days ago as well as an EKG earlier in the year and appears similar.  There is possible elevation with right bundle branch block morphology in V1 and V2 as well as aVR,?  Inferoposterior MI.  Given his significant troponin elevation of greater than 27,000 x 2 and ongoing chest pain, would classify this as a high risk NSTEMI and recommend urgent cardiac catheterization.  Start heparin, aspirin, beta-blockers and other standard care therapies for acute MI. 2. AFIB -currently in mild RVR with a right bundle branch block.  This is a chronic issue.  Anticoagulated on warfarin however fortunately INR is 1.8 today.  He will need bridging with IV heparin and plan holding his  warfarin given heart catheterization.  Will likely resume that post catheterization. 3. ESRD on HD -we will contact nephrology to institute dialysis.  He is actually supposedly scheduled for dialysis today (T, TH, [facility]). 4. OSA -continue CPAP nightly. 5. Chronic DVT -on warfarin, INR 1.8.  Hold warfarin and bridge with IV heparin until post cath. 6. Dyslipidemia-on rosuvastatin 20 mg daily.  This was present prior to being on dialysis and therefore would continue however data with statins and end-stage renal disease is unknown. 7. Insulin-dependent diabetes-continue current insulin regimen and monitor blood sugars.  Full code  Time Spent Directly with Patient:  I have spent a total of 45 minutes with patient reviewing hospital notes, telemetry, EKGs, labs and examining the patient as well as establishing an assessment and plan that was discussed with the patient.  > 50% of time was spent in direct patient care.   Length of Stay:  LOS: 0 days   Pixie Casino, MD, [facility], [place] Director of the Advanced Lipid Disorders &  Cardiovascular Risk Reduction Clinic Diplomate of the American Board of Clinical Lipidology Attending Cardiologist  Direct Dial: [phone]  Fax: [phone]  Website:  [email or web address] Shone Leventhal 07/27/2020, 2:29 PM

## 2020-07-27 NOTE — ED Provider Notes (Signed)
[name] [name] is a 54 y.o. male.  The history is provided by the patient and medical records. No language interpreter was used.  Chest Pain    54 year old male with history of end-stage renal disease currently on dialysis, CHF, COPD, diabetes, GERD, gastroparesis, brought here via EMS from home for evaluation of chest pain.  Patient report for the past 2 weeks he has been having intermittent right-sided chest pain.  Yesterday he was pared out to have a bowel movement when his pain intensified.  Described pain as a pressure sensation across his chest with associated generalized weakness.  Pain has been waxing waning but became more intense today while he was waiting to go to dialysis.  Currently rates his pain as 8 out of 10.  He mention been seen by cardiology 3 days ago for symptoms and is set up to have some type of testing.  He denies any burning sensation with his chest pain but did report increased pain with eating.  No associated fever chills or productive cough no diaphoresis nausea vomiting diarrhea.  He has not been vaccinated for COVID-19.  Past Medical History:  Diagnosis Date  . Allergic rhinitis   . Allergy   . Anemia   . Aortic insufficiency    a. Echo 7/16:  Mod LVH, EF 50-55%, mild to mod AI, severe LAE, PASP 57 mmHg (LV ID end diastolic 85.2 mm);  b. Echo 2/17:  Mod LVH, EF 50-55%, mod AI, MAC, mild MR, mod LAE, mild to mod TR, PASP 63 mmHg  . Bilateral carpal tunnel syndrome   . Cataract    removed both eyes   . CHF (congestive heart failure) ([place])   . Claustrophobia   . COPD (chronic obstructive pulmonary disease) ([place])   . Diabetes mellitus    Type II  . Diabetic peripheral neuropathy ([place])   . Diabetic retinopathy ([place])   . Discitis of lumbar region    resolved  . DVT  (deep venous thrombosis) ([place]) 10/07/2015   LLE  . Endocarditis 11/28/14  . ESRD (end stage renal disease) on dialysis [facility])    s/p renal transplant in 2009, failed in 2016  . Gait disorder   . Gastroparesis   . GERD (gastroesophageal reflux disease)   . Gout   . Hearing difficulty   . Hemodialysis patient ([place])    tuesday, thursday, saturday   . Hyperlipidemia   . Hypertension   . Incomplete bladder emptying   . Leg pain 02/05/11   with walking  . Morbid obesity ([place])   . Obstructive sleep apnea    not using CPAP- lost 100lbs  . Osteomyelitis ([place])   . Posttraumatic stress disorder   . Rotator cuff disorder   . Secondary hyperparathyroidism ([place])   . Sepsis ([place])    Postive blood cultures -   . Sleep apnea    no cpap  . Urethral stricture   . Vitamin D deficiency     Patient Active Problem List   Diagnosis Date Noted  . Band keratopathy of both eyes 11/10/2019  . Pseudophakia of both eyes 11/10/2019  . Routine general medical examination at a health care facility 07/21/2019  . Complication of vascular dialysis catheter 07/17/2019  . Rectal pain 04/20/2019  . Allergy, unspecified, sequela 12/29/2018  .  Pain, unspecified 12/29/2018  . Leg lesion 06/16/2018  . Mild protein-calorie malnutrition ([place]) 05/09/2018  . Hyperkalemia 04/11/2018  . Hemorrhoid 02/28/2018  . Numbness 10/20/2017  . Fluid overload, unspecified 06/04/2016  . Hypocalcemia 02/20/2016  . Anterior urethral stricture 12/09/2015  . Iron deficiency anemia, unspecified 11/30/2015  . Dependence on renal dialysis ([place]) 11/27/2015  . ESRD on dialysis ([place]) 11/15/2015  . Coagulation defect, unspecified ([place]) 11/02/2015  . Acute on chronic systolic (congestive) heart failure ([place]) 10/29/2015  . CHF (congestive heart failure) ([place]) 10/07/2015  . Prolonged Q-T interval on ECG 10/07/2015  . Chronic deep vein thrombosis (DVT) of left lower extremity ([place]) 10/07/2015  . Hypomagnesemia 06/26/2015  . Aortic  insufficiency 04/12/2015  . Endocarditis 12/04/2014  . Lumbar discitis 11/22/2014  . Back pain at L4-L5 level   . Anemia due to chronic kidney disease   . Cervical radiculopathy   . Diabetic peripheral neuropathy ([place]) 10/16/2014  . Hematuria 08/06/2014  . Renal transplant, status post 04/05/2014  . OSA (obstructive sleep apnea) 04/05/2014  . Severe obesity (BMI >= 40) ([place]) 04/05/2014  . Benign fibroma of prostate 02/05/2014  . Bladder neck obstruction 02/05/2014  . Visceral obesity 04/08/2012  . Male infertility 03/28/2012  . Immunosuppressive management encounter following kidney transplant 03/24/2012  . Diabetic macular edema ([place]) 01/15/2012  . Diabetes mellitus, type 2 ([place]) 01/15/2012  . IMMUNOCOMPROMISED 01/30/2010  . Diarrhea 01/30/2010  . HYPERPARATHYROIDISM, SECONDARY 01/27/2010  . GOUT 01/27/2010  . Anxiety 01/27/2010  . INSOMNIA 01/27/2010  . HLD (hyperlipidemia) 05/25/2008  . Essential hypertension 05/25/2008    Past Surgical History:  Procedure Laterality Date  . ARTERIOVENOUS GRAFT PLACEMENT Bilateral "several"  . AV FISTULA PLACEMENT Bilateral 2015  . AV FISTULA PLACEMENT Right 06/13/2019   Procedure: INSERTION OF ARTERIOVENOUS (AV) GORE-TEX GRAFT RIGHT  ARM;  Surgeon: Elam Dutch, MD;  Location: [facility];  Service: Vascular;  Laterality: Right;  . BASCILIC VEIN TRANSPOSITION Right 09/27/2015  . [place] Right 09/27/2015   Procedure: BASILIC VEIN TRANSPOSITION right;  Surgeon: Mal Misty, MD;  Location: [facility];  Service: Vascular;  Laterality: Right;  . CATARACT EXTRACTION W/ INTRAOCULAR LENS  IMPLANT, BILATERAL Bilateral   . CYSTOSCOPY W/ INTERNAL URETHROTOMY  09/2014  . IR THROMBECTOMY AV FISTULA W/THROMBOLYSIS/PTA INC/SHUNT/IMG RIGHT Right 08/01/2019  . IR US GUIDE VASC ACCESS RIGHT  08/01/2019  . KIDNEY TRANSPLANT  2009  . SIGMOIDOSCOPY    . UPPER EXTREMITY VENOGRAPHY Bilateral 06/02/2019   Procedure: UPPER EXTREMITY VENOGRAPHY;   Surgeon: Elam Dutch, MD;  Location: [facility];  Service: Cardiovascular;  Laterality: Bilateral;  . UPPER GASTROINTESTINAL ENDOSCOPY         Family History  Problem Relation Age of Onset  . Hypertension Mother   . Diabetes Mother   . Diabetes Father   . Diabetes Brother   . Colon cancer Neg Hx   . Colon polyps Neg Hx   . Esophageal cancer Neg Hx   . Rectal cancer Neg Hx   . Stomach cancer Neg Hx     Social History   Tobacco Use  . Smoking status: Never Smoker  . Smokeless tobacco: Never Used  Vaping Use  . Vaping Use: Never used  Substance Use Topics  . Alcohol use: No    Alcohol/week: 0.0 standard drinks  . Drug use: No    Home Medications Prior to Admission medications   Medication Sig Start Date End Date Taking? Authorizing Provider  Accu-Chek FastClix Lancets  MISC USE TO CHECK BLOOD SUGARS 4 TIMES A DAY. 10/09/19   Elayne Snare, MD  allopurinol (ZYLOPRIM) 100 MG tablet Take 1 tablet (100 mg total) by mouth 3 (three) times a week. After dialysis 05/17/19   Hoyt Koch, MD  AURYXIA 1 GM 210 MG(Fe) tablet  11/08/19   [provider]  busPIRone (BUSPAR) 5 MG tablet TAKE 1 TABLET (5 MG TOTAL) BY MOUTH 2 (TWO) TIMES DAILY AS NEEDED. 06/06/20   Hoyt Koch, MD  calcium acetate (PHOSLO) 667 MG capsule Take 3 capsules (2,001 mg total) by mouth 3 (three) times daily with meals. 10/26/15   Ghimire, Henreitta Leber, MD  clonazePAM (KLONOPIN) 0.5 MG tablet Take 1 tablet (0.5 mg total) by mouth 2 (two) times daily as needed for anxiety. 07/22/20   Biagio Borg, MD  Cysteamine Bitartrate (PROCYSBI) 300 MG PACK USE TO CHECK BLOOD SUGARS 4 TIMES A DAY. 10/09/19   [provider]  DROPLET PEN NEEDLES 32G X 4 MM MISC USE  TO INJECT FIVE TIMES DAILY AS DIRECTED 08/08/19   Elayne Snare, MD  glucose blood (ACCU-CHEK GUIDE) test strip 1 each by Other route as needed for other. Use as instructed to check blood sugar 3 times daily. DX:E11.65 03/08/20    Elayne Snare, MD  heparin 1000 UNIT/ML injection Heparin Sodium (Porcine) 1,000 Units/mL Catheter Lock Venous 05/18/19 05/16/20  [provider]  heparin sodium, porcine, 1000 UNIT/ML injection Heparin Sodium (Porcine) 1,000 Units/mL Catheter Lock Venous 05/18/19   [provider]  hydrocortisone 1 % ointment Apply 1 application topically 2 (two) times daily. 10/16/19   Levin Erp, PA  Hydrocortisone, Perianal, 1 % CREA Apply topically. 03/15/18   [provider]  hydrocortisone-pramoxine (PROCTOFOAM-HC) rectal foam Place 1 applicator rectally 2 (two) times daily. 10/16/19   Gatha Mayer, MD  Insulin Pen Needle 32G X 8 MM MISC Use with insulin x5 times a day 08/16/19   Elayne Snare, MD  insulin regular human CONCENTRATED (HUMULIN R U-500 KWIKPEN) 500 UNIT/ML kwikpen INJECT 50 UNITS UNDER THE SKIN AT BREAKFAST, 70 UNITS AT LUNCH, AND 60 UNITS AT DINNER. 07/09/20   Elayne Snare, MD  loperamide (IMODIUM) 2 MG capsule Take 1 capsule (2 mg total) by mouth as needed for diarrhea or loose stools. 10/20/19   Levin Erp, PA  midodrine (PROAMATINE) 10 MG tablet Take 10 mg by mouth as needed (low blood pressure).  01/08/17   [provider]  mupirocin ointment (BACTROBAN) 2 % Apply 1 application topically daily. Apply to dorsal foot wound daily 05/29/20   Criselda Peaches, DPM  omeprazole (PRILOSEC) 20 MG capsule Take 1 capsule (20 mg total) by mouth daily. Annual appt due in Oct must see provider for future refills 03/01/20   Hoyt Koch, MD  RENVELA 800 MG tablet Take 2,400 mg by mouth 3 (three) times daily with meals.  02/02/17   [provider]  rosuvastatin (CRESTOR) 20 MG tablet Take 1 tablet (20 mg total) by mouth at bedtime. Annual appt due in Oct must see provider for future refills 03/01/20   Hoyt Koch, MD  sodium zirconium cyclosilicate (LOKELMA) 10 g PACK packet Take by mouth. 12/26/19   [provider]  TOUJEO MAX SOLOSTAR  300 UNIT/ML Solostar Pen INJECT 80 UNITS UNDER THE SKIN IN THE MORNING AND 80 UNITS IN THE EVENING 06/11/20   Elayne Snare, MD  VELTASSA 8.4 g packet Take 8.4 g by mouth daily.  12/30/16  [provider]  warfarin (COUMADIN) 5 MG tablet TAKE 1 TABLET EVERY DAY OR AS DIRECTED BY COUMADIN CLINIC. 06/11/20   Hoyt Koch, MD    Allergies    Pork-derived products, Ace inhibitors, Hydrocodone, and Pork allergy  Review of Systems   Review of Systems  Cardiovascular: Positive for chest pain.  All other systems reviewed and are negative.   Physical Exam Updated Vital Signs BP (!) 86/36 (BP Location: Right Arm)   Pulse (!) 108   Temp 97.6 F (36.4 C) (Oral)   Resp 18   SpO2 99%   Physical Exam Vitals and nursing note reviewed.  Constitutional:      General: He is not in acute distress.    Appearance: He is well-developed. He is obese.  HENT:     Head: Atraumatic.  Eyes:     Conjunctiva/sclera: Conjunctivae normal.  Cardiovascular:     Rate and Rhythm: Rhythm irregular.     Heart sounds: Normal heart sounds. No murmur heard.   Pulmonary:     Effort: Pulmonary effort is normal. No accessory muscle usage.     Breath sounds: Normal breath sounds. No decreased breath sounds, wheezing, rhonchi or rales.  Chest:     Chest wall: No tenderness.  Abdominal:     Palpations: Abdomen is soft.     Tenderness: There is no abdominal tenderness.  Musculoskeletal:     Cervical back: Neck supple.  Skin:    Findings: No rash.  Neurological:     Mental Status: He is alert and oriented to person, place, and time.  Psychiatric:        Mood and Affect: Mood normal.     ED Results / Procedures / Treatments   Labs (all labs ordered are listed, but only abnormal results are displayed) Labs Reviewed  BASIC METABOLIC PANEL - Abnormal; Notable for the following components:      Result Value   Chloride 91 (*)    Glucose, Bld 148 (*)    BUN 49 (*)    Creatinine, Ser 9.20 (*)     Calcium 8.8 (*)    GFR, Estimated 6 (*)    All other components within normal limits  CBC - Abnormal; Notable for the following components:   RBC 4.05 (*)    Hemoglobin 12.0 (*)    RDW 18.5 (*)    All other components within normal limits  TROPONIN I (HIGH SENSITIVITY) - Abnormal; Notable for the following components:   Troponin I (High Sensitivity) >27,000 (*)    All other components within normal limits  RESPIRATORY PANEL BY RT PCR (FLU A&B, COVID)  PROTIME-INR  TROPONIN I (HIGH SENSITIVITY)    EKG EKG Interpretation  Date/Time:  Saturday July 27 2020 11:33:35 EDT Ventricular Rate:  109 PR Interval:    QRS Duration: 134 QT Interval:  429 QTC Calculation: 578 R Axis:   -76 Text Interpretation: Atrial fibrillation Multiple ventricular premature complexes IVCD, consider atypical RBBB Probable anterolateral infarct, age indeterm Probable RV involvement, suggest recording right precordial leads When compared to prior, similar appearance with faster rate.  Per cards, no STEMI Confirmed by Antony Blackbird [phone]) on 07/27/2020 11:52:45 AM   Radiology DG Chest Portable 1 View  Result Date: 07/27/2020 CLINICAL DATA:  Chest pain and shortness of breath EXAM: PORTABLE CHEST 1 VIEW COMPARISON:  April 11, 2018 FINDINGS: There is ill-defined airspace opacity in the left base. Lungs are otherwise clear. There is cardiomegaly with pulmonary vascularity within normal limits. No  adenopathy. No bone lesions. No pneumothorax IMPRESSION: Opacity left base concerning for potential pneumonia. Lungs otherwise clear. There is cardiac enlargement. No adenopathy appreciable. Electronically Signed   By: Lowella Grip III M.D.   On: 07/27/2020 12:30    Procedures .Critical Care Performed by: Domenic Moras, PA-C Authorized by: Domenic Moras, PA-C   Critical care provider statement:    Critical care time (minutes):  35   Critical care was time spent personally by me on the following activities:   Discussions with consultants, evaluation of patient's response to treatment, examination of patient, ordering and performing treatments and interventions, ordering and review of laboratory studies, ordering and review of radiographic studies, pulse oximetry, re-evaluation of patient's condition, obtaining history from patient or surrogate and review of old charts   (including critical care time)  Medications Ordered in ED Medications  sodium chloride 0.9 % bolus 500 mL (0 mLs Intravenous Stopped 07/27/20 1223)    ED Course  I have reviewed the triage vital signs and the nursing notes.  Pertinent labs & imaging results that were available during my care of the patient were reviewed by me and considered in my medical decision making (see chart for details).    MDM Rules/Calculators/A&P                          BP (!) 82/38 (BP Location: Left Arm)   Pulse (!) 107   Temp 97.6 F (36.4 C) (Oral)   Resp (!) 21   Ht _0  (1.778 m)   Wt 129 kg   SpO2 96%   BMI 40.81 kg/m   Final Clinical Impression(s) / ED Diagnoses Final diagnoses:  ST elevation myocardial infarction (STEMI), unspecified artery ([place])    Rx / DC Orders ED Discharge Orders    None     11:48 AM Patient with intermittent right-sided chest pressure sensation here with progressive worsening chest pain since yesterday after he was bearing down to have a bowel movement.  He also endorsed eating seems to make his pain worse and he is afraid to eat.  He was seen at cardiology office 3 days ago for similar complaint.  It was felt that his pain is atypical of ACS.  I did reach out to cardiologist, Dr. Angelena Form who recommend calling cardiology team to have patient admitted for further evaluation.  He does not think patient is having an active STEMI as indicated on his EKG today.  Patient is hypertensive with a systolic blood pressure in the 80s, and mildly tachycardic with A. fib on EKG.  He is a dialysis patient therefore  gentle hydration initiated including 500 cc of normal saline.  Will consult cardiology for admission.  12:59 PM Troponin is greater than 27,000.  With EKG indicating acute STEMI and patient still having active chest pain, will initiate heparin, cardiology has been consulted.  1:11 PM Appreciate consultation from cardiologist, Dr. Debara Pickett, who agrees to see and admit patient.  Likely will take patient to Cath Lab.  Repeat EKG with single morphology which shows a probable acute inferior infarction as well as probable anterior infarct.  Chest x-ray shows opacity left base concerning for potential pneumonia.  Patient otherwise without complaints of cough or fever.  COVID-19 test sent.  Mandel Seiden was evaluated in Emergency Department on 07/27/2020 for the symptoms described in the history of present illness. He was evaluated in the context of the global COVID-19 pandemic, which necessitated consideration that the patient  might be at risk for infection with the SARS-CoV-2 virus that causes COVID-19. Institutional protocols and algorithms that pertain to the evaluation of patients at risk for COVID-19 are in a state of rapid change based on information released by regulatory bodies including the CDC and federal and state organizations. These policies and algorithms were followed during the patient's care in the ED.    Domenic Moras, PA-C 07/27/20 1313    Tegeler, Gwenyth Allegra, MD 07/27/20 [phone]

## 2020-07-27 NOTE — Progress Notes (Signed)
Paged Dr. Rudi Rummage and he called back and RN made MD aware that patient is asking for something for pain for sheath removal and asking for more Klonopin stating he takes 1 mg at home sometimes when he is really anxious. MD stated he would order something for pain but that he did not want to order additional klonopin.

## 2020-07-27 NOTE — Progress Notes (Signed)
Spoke with Dr. Rudi Rummage and asked about when to pull sheath.  MD gave order to check ACT at 2200 and if ACT is less than 180 then to go ahead and pull sheath. RN also let MD know that patient is asking for Klonopin like he takes at home. MD stated he would order home dose of PRN Klonopin.

## 2020-07-27 NOTE — Interval H&P Note (Signed)
History and Physical Interval Note:  07/27/2020 2:41 PM  [name] [name]  has presented today for surgery, with the diagnosis of STEMI.  The various methods of treatment have been discussed with the patient and family. After consideration of risks, benefits and other options for treatment, the patient has consented to  Procedure(s): LEFT HEART CATH AND CORONARY ANGIOGRAPHY (N/A) as a surgical intervention.  The patient's history has been reviewed, patient examined, no change in status, stable for surgery.  I have reviewed the patient's chart and labs.  Questions were answered to the patient's satisfaction.    Cath Lab Visit (complete for each Cath Lab visit)  Clinical Evaluation Leading to the Procedure:   ACS: Yes.    Non-ACS:    Anginal Classification: CCS IV  Anti-ischemic medical therapy: No Therapy  Non-Invasive Test Results: No non-invasive testing performed  Prior CABG: No previous CABG        Lauree Chandler

## 2020-07-27 NOTE — ED Notes (Signed)
Date and time results received: 07/27/20 1259 (use smartphrase ".now" to insert current time)  Test: troponin Critical Value: >27,000  Name of Provider Notified: tegeler  Orders Received? Or Actions Taken?: Actions Taken: consult cardiology

## 2020-07-27 NOTE — Progress Notes (Signed)
Paged Dr. Rudi Rummage for orders for sheath removal with ACT monitoring. ACT 180.

## 2020-07-27 NOTE — ED Triage Notes (Addendum)
Pt to triage via [facility] from home.  Reports chest pain that is worse when he eats.  Last dialysis on Thursday.  Is supposed to be at dialysis now.  324mg  ASA given PTA.  Denies SOB, nausea, and vomiting.  Placed on 2 liters [place] by EMS.  Pt hypotensive on arrival.

## 2020-07-28 ENCOUNTER — Other Ambulatory Visit (HOSPITAL_COMMUNITY): Payer: Medicare HMO

## 2020-07-28 DIAGNOSIS — N186 End stage renal disease: Secondary | ICD-10-CM | POA: Diagnosis not present

## 2020-07-28 DIAGNOSIS — I214 Non-ST elevation (NSTEMI) myocardial infarction: Secondary | ICD-10-CM | POA: Diagnosis not present

## 2020-07-28 DIAGNOSIS — I5022 Chronic systolic (congestive) heart failure: Secondary | ICD-10-CM | POA: Diagnosis not present

## 2020-07-28 DIAGNOSIS — I25119 Atherosclerotic heart disease of native coronary artery with unspecified angina pectoris: Secondary | ICD-10-CM

## 2020-07-28 DIAGNOSIS — G4733 Obstructive sleep apnea (adult) (pediatric): Secondary | ICD-10-CM | POA: Diagnosis not present

## 2020-07-28 LAB — BASIC METABOLIC PANEL
Anion gap: 14 (ref 5–15)
BUN: 57 mg/dL — ABNORMAL HIGH (ref 6–20)
CO2: 26 mmol/L (ref 22–32)
Calcium: 8.4 mg/dL — ABNORMAL LOW (ref 8.9–10.3)
Chloride: 92 mmol/L — ABNORMAL LOW (ref 98–111)
Creatinine, Ser: 10.21 mg/dL — ABNORMAL HIGH (ref 0.61–1.24)
GFR, Estimated: 5 mL/min — ABNORMAL LOW (ref >60)
Glucose, Bld: 330 mg/dL — ABNORMAL HIGH (ref 70–99)
Potassium: 5.1 mmol/L (ref 3.5–5.1)
Sodium: 132 mmol/L — ABNORMAL LOW (ref 135–145)

## 2020-07-28 LAB — CBC
HCT: 36.1 % — ABNORMAL LOW (ref 39.0–52.0)
Hemoglobin: 11.3 g/dL — ABNORMAL LOW (ref 13.0–17.0)
MCH: 29.7 pg (ref 26.0–34.0)
MCHC: 31.3 g/dL (ref 30.0–36.0)
MCV: 95 fL (ref 80.0–100.0)
Platelets: 240 10*3/uL (ref 150–400)
RBC: 3.8 MIL/uL — ABNORMAL LOW (ref 4.22–5.81)
RDW: 18.2 % — ABNORMAL HIGH (ref 11.5–15.5)
WBC: 8.4 10*3/uL (ref 4.0–10.5)
nRBC: 0 % (ref 0.0–0.2)

## 2020-07-28 LAB — HEPATITIS B SURFACE ANTIGEN: Hepatitis B Surface Ag: NONREACTIVE

## 2020-07-28 LAB — GLUCOSE, CAPILLARY
Glucose-Capillary: 325 mg/dL — ABNORMAL HIGH (ref 70–99)
Glucose-Capillary: 371 mg/dL — ABNORMAL HIGH (ref 70–99)
Glucose-Capillary: 126 mg/dL — ABNORMAL HIGH (ref 70–99)
Glucose-Capillary: 260 mg/dL — ABNORMAL HIGH (ref 70–99)

## 2020-07-28 MED ORDER — ALBUMIN HUMAN 25 % IV SOLN
INTRAVENOUS | Status: AC
  Administered 2020-07-28: 25 g
  Filled 2020-07-28: qty 100

## 2020-07-28 MED ORDER — ALBUMIN HUMAN 5 % IV SOLN
25.0000 g | Freq: Once | INTRAVENOUS | Status: AC
  Filled 2020-07-28: qty 500

## 2020-07-28 MED ORDER — CHLORHEXIDINE GLUCONATE CLOTH 2 % EX PADS
6.0000 | MEDICATED_PAD | Freq: Every day | CUTANEOUS | Status: DC
  Administered 2020-07-28 – 2020-07-31 (×3): 6 via TOPICAL

## 2020-07-28 MED ORDER — ONDANSETRON HCL 4 MG/2ML IJ SOLN: 4.0000 mg | Freq: Once | INTRAMUSCULAR | Status: AC

## 2020-07-28 MED ORDER — ONDANSETRON HCL 4 MG/2ML IJ SOLN
INTRAMUSCULAR | Status: AC
  Administered 2020-07-28: 4 mg via INTRAVENOUS
  Filled 2020-07-28: qty 2

## 2020-07-28 NOTE — Progress Notes (Signed)
Pt arrived from unit from dialysis from 2 Heart. BP 100/63 (74) HR 100 RR 13 Sp02 97 RA but pt request O2 for comfort .Will continue to monitor. Pt. Oriented to unit. CHG bath given and gown changed.   Phoebe Sharps, RN

## 2020-07-28 NOTE — Procedures (Signed)
I was present at this dialysis session. I have reviewed the session itself and made appropriate changes.   Using AVG. 2K bath. 4L UF goal. Mild IDH, can try IVB albumin for hemodynamic support.   Filed Weights   07/27/20 1150  Weight: 129 kg    Recent Labs  Lab 07/28/20 0121  NA 132*  K 5.1  CL 92*  CO2 26  GLUCOSE 330*  BUN 57*  CREATININE 10.21*  CALCIUM 8.4*    Recent Labs  Lab 07/27/20 1122 07/28/20 0121  WBC 8.4 8.4  HGB 12.0* 11.3*  HCT 39.9 36.1*  MCV 98.5 95.0  PLT 247 240    Scheduled Meds: . aspirin  81 mg Oral Daily  . Chlorhexidine Gluconate Cloth  6 each Topical Daily  . clopidogrel  75 mg Oral Q breakfast  . ferric citrate  210 mg Oral TID WC  . insulin aspart  0-15 Units Subcutaneous TID WC  . insulin aspart  0-5 Units Subcutaneous QHS  . insulin glargine  80 Units Subcutaneous BID  . insulin regular human CONCENTRATED  50 Units Subcutaneous TID WC  . pantoprazole  40 mg Oral Daily  . patiromer  8.4 g Oral Daily  . rosuvastatin  40 mg Oral QHS  . sevelamer carbonate  2,400 mg Oral TID WC  . sodium chloride flush  3 mL Intravenous Q12H   Continuous Infusions: . sodium chloride     PRN Meds:.sodium chloride, acetaminophen, clonazePAM, sodium chloride flush   Pearson Grippe  MD 07/28/2020, 12:38 PM

## 2020-07-28 NOTE — Procedures (Signed)
Echo attempted. Patient in dialysis at 2:16 PM. Will attempt again later.

## 2020-07-28 NOTE — Procedures (Signed)
Echo attempted. Patient in chair. Per conversation with nurse, will attempt again later.

## 2020-07-28 NOTE — Progress Notes (Addendum)
Progress Note  Patient Name: [name] [name] Date of Encounter: 07/28/2020  [facility] Cardiologist: Lauree Chandler, MD   Subjective   Presented yesterday with prolonged chest discomfort.  For this 72 to 96 hours prior to admission had been having crescendo angina (atypical chest discomfort with minimal activity).  On admission because of his left bundle, EKG was not helpful relative to diagnosis of acute ischemia.  Subsequently marked elevation in troponin I was noted, leading to diagnosis of non-ST elevation MI, leading to coronary angiography and identification of total occlusion of the proximal to mid LAD treated with drug-eluting stent.  Now completely pain-free.  Procedure done from the right femoral.  No bleeding, but does have some mild shortness of breath.  He prefers to sit up.  Chronic end-stage kidney disease on dialysis.  Inpatient Medications    Scheduled Meds: . aspirin  81 mg Oral Daily  . atropine      . Chlorhexidine Gluconate Cloth  6 each Topical Daily  . clopidogrel  75 mg Oral Q breakfast  . ferric citrate  210 mg Oral TID WC  . insulin aspart  0-15 Units Subcutaneous TID WC  . insulin aspart  0-5 Units Subcutaneous QHS  . insulin glargine  80 Units Subcutaneous BID  . insulin regular human CONCENTRATED  50 Units Subcutaneous TID WC  . pantoprazole  40 mg Oral Daily  . patiromer  8.4 g Oral Daily  . rosuvastatin  40 mg Oral QHS  . sevelamer carbonate  2,400 mg Oral TID WC  . sodium chloride flush  3 mL Intravenous Q12H   Continuous Infusions: . sodium chloride     PRN Meds: sodium chloride, acetaminophen, clonazePAM, sodium chloride flush   Vital Signs    Vitals:   07/28/20 0500 07/28/20 0600 07/28/20 0700 07/28/20 0753  BP: (!) 139/93 126/75 104/74   Pulse: 92 94 98   Resp: 18 (!) 28 (!) 22   Temp:    98.3 F (36.8 C)  TempSrc:      SpO2: 100% 100% 94%   Weight:      Height:        Intake/Output Summary (Last 24 hours) at  07/28/2020 0757 Last data filed at 07/28/2020 0300 Gross per 24 hour  Intake 745.89 ml  Output --  Net 745.89 ml   Last 3 Weights 07/27/2020 07/24/2020 03/22/2020  Weight (lbs) 284 lb 6.3 oz 296 lb 6.4 oz 285 lb  Weight (kg) 129 kg 134.446 kg 129.275 kg  Some encounter information is confidential and restricted. Go to Review Flowsheets activity to see all data.      Telemetry    Atrial flutter with variable block but overall controlled rate at 90 bpm.- Personally Reviewed  ECG    Right bundle, left anterior hemiblock, atrial flutter with variable AV conduction.  No change compared to tracing done on 07/24/2020- Personally Reviewed  Physical Exam  Morbidly obese GEN: No acute distress but sitting at bedside because he feels better sitting than lying Neck:  JVD is difficult to assess Cardiac:  Irregular RR, no obvious systolic murmurs, he is known to have aortic regurgitation but this is difficult to hear this a.m.  No rubs, or gallops. VASCULAR: No hematoma at right groin access site Respiratory: Clear to auscultation bilaterally. GI: Soft, nontender, non-distended  MS: No edema; No deformity. Neuro:  Nonfocal  Psych: Normal affect   Labs    High Sensitivity Troponin:   Recent Labs  Lab 07/27/20 1122 07/27/20  1320  TROPONINIHS >27,000* >27,000*      Chemistry Recent Labs  Lab 07/27/20 1122 07/28/20 0121  NA 136 132*  K 4.3 5.1  CL 91* 92*  CO2 30 26  GLUCOSE 148* 330*  BUN 49* 57*  CREATININE 9.20* 10.21*  CALCIUM 8.8* 8.4*  GFRNONAA 6* 5*  ANIONGAP 15 14     Hematology Recent Labs  Lab 07/27/20 1122 07/28/20 0121  WBC 8.4 8.4  RBC 4.05* 3.80*  HGB 12.0* 11.3*  HCT 39.9 36.1*  MCV 98.5 95.0  MCH 29.6 29.7  MCHC 30.1 31.3  RDW 18.5* 18.2*  PLT 247 240    BNPNo results for input(s): BNP, PROBNP in the last 168 hours.   DDimer No results for input(s): DDIMER in the last 168 hours.   Radiology    CARDIAC CATHETERIZATION  Addendum Date:  07/27/2020    Lat Ramus lesion is 100% stenosed.  1st Mrg lesion is 100% stenosed.  Prox LAD lesion is 99% stenosed.  A drug-eluting stent was successfully placed using a STENT RESOLUTE ONYX 3.5X18.  Post intervention, there is a 0% residual stenosis.  1. Severe proximal to mid LAD stenosis 2. Chronic occlusion of the distal intermediate branch, small in caliber 3. Complete occlusion of the distal segment of the first obtuse marginal branch. Cannot exclude that this was the infarct vessel but the patient was pain free and this vessel did not appear large enough for PCI. There is collateral filling of lateral wall branches from the RCA 4. Successful PTCA/DES x 1 proximal to mid LAD Recommendations: Will admit to the ICU. Will continue DAPT with ASA and Plavix. Resume coumadin prior to discharge. I would plan to use triple therapy with ASA, Plavix and coumadin for one month then stop ASA. Continue statin. Will not start a beta blocker given his soft BP. Echo tomorrow.   Result Date: 07/27/2020  Lat Ramus lesion is 100% stenosed.  1st Mrg lesion is 100% stenosed.  Prox LAD lesion is 99% stenosed.  1. Severe proximal to mid LAD stenosis 2. Chronic occlusion of the distal intermediate branch, small in caliber 3. Complete occlusion of the distal segment of the first obtuse marginal branch. Cannot exclude that this was the infarct vessel but the patient was pain free and this vessel did not appear large enough for PCI. There is collateral filling of lateral wall branches from the RCA 4. Successful PTCA/DES x 1 proximal to mid LAD Recommendations: Will admit to the ICU. Will continue DAPT with ASA and Plavix. Resume coumadin prior to discharge. I would plan to use triple therapy with ASA, Plavix and coumadin for one month then stop ASA. Continue statin. Will not start a beta blocker given his soft BP. Echo tomorrow.   DG Chest Portable 1 View  Result Date: 07/27/2020 CLINICAL DATA:  Chest pain and shortness  of breath EXAM: PORTABLE CHEST 1 VIEW COMPARISON:  April 11, 2018 FINDINGS: There is ill-defined airspace opacity in the left base. Lungs are otherwise clear. There is cardiomegaly with pulmonary vascularity within normal limits. No adenopathy. No bone lesions. No pneumothorax IMPRESSION: Opacity left base concerning for potential pneumonia. Lungs otherwise clear. There is cardiac enlargement. No adenopathy appreciable. Electronically Signed   By: Lowella Grip III M.D.   On: 07/27/2020 12:30    Cardiac Studies   Cardiac catheterization 07/27/2020: Diagnostic Dominance: Right  Intervention   Hemodynamics at time of heart catheterization: LVEDP 27 mmHg.   Patient Profile     54  y.o. male ESRD on dialysis, COPD, AI, LBBB, OSA, DM II, chronic atrial flutter, chronic anticoagulation, chronic DVT,  chronic diastolic HF, history of endocarditis, presents with chest pain - found to have NSTEMI --> LAD stent  Assessment & Plan    1. Coronary artery disease with non-ST elevation MI: Presented with anterior MI yesterday.  Significant elevation in troponin.  Treated with LAD stent as noted above.  Pain-free this morning.  Needs aggressive risk factor modification. 2. Acute combined systolic and diastolic heart failure: Will need echocardiogram today to evaluate residual LV function.  He is volume overloaded currently.  LVEDP yesterday was 27 at the time of angiography.  Needs dialysis as soon as possible today.  We will get 2D Doppler echocardiogram to confirm LV function.  Guideline directed therapy for systolic heart failure will be impacted by kidney failure and soft blood pressures. 3. OBSTRUCTIVE sleep apnea: Provide CPAP as needed 4. DIABETES mellitus II: Blood sugars will need to be managed to avoid decompensation.  May need primary care help. 5. Chronic atrial flutter and Coumadin anticoagulation: Coumadin will be continued.  Aspirin, Coumadin, and Plavix for 2 to 4 weeks then drop aspirin.   Will need Plavix for 12 months. 6. End-stage kidney disease on chronic dialysis: Have notified [facility] although have not yet gotten a call back from Dr. Joelyn Oms to confirm dialysis will be done today.  For questions or updates, please contact [place] Please consult [email or web address] for contact info under   CRITICAL care time 40 minutes    Signed, Sinclair Grooms, MD  07/28/2020, 7:57 AM

## 2020-07-28 NOTE — Progress Notes (Signed)
Sitting up in bed eating breakfast. Right femoral groin site free of complications with no bleeding nor hematoma. Time up was 0645. Palpable right pedal pulse.

## 2020-07-28 NOTE — Progress Notes (Signed)
Right femoral artery sheath removed on 07/28/2020 Site was a level 0 prior to removal Manual Pressure was applied for 20 minutes starting 2424 to 2444 Patients vital signs were stable throughout (see flowsheet ) Patients site was a level 0 after the procedure Post pull instructions were given The right dorsalis pedal pulse was consistent and palpable through tout the entire procedure A gauze pressure dressing was applied to site site The patient tolerated the  procedure well, will continue to monitor closely, report given to patients nurse

## 2020-07-28 NOTE — Consult Note (Signed)
[name] [name] Admit Date: 07/27/2020 07/28/2020 [name] Requesting Physician:  Linard Millers  Reason for Consult:  ESRD Comanagement HPI:  53M ESRD THS [place] RUE AVG admitted yesterday with crescendo chest pain, elevated troponin, and questionable EKG changes.  Underwent cardiac catheterization with near complete occlusion of LAD and underwent PCI therapy.  Since has been in the ICU.  Up and out of bed to chair.  Does have significant dyspnea.  Chest pain has resolved.  Blood pressure stable, on room air with normal SPO2.  Medications including clopidogrel, aspirin.  K5.1 this morning.  HCO3 26.  Hemoglobin 11.3.  PMH Incudes:  DM2  GERD  Hypertension  Hyperlipidemia  Obesity  OSA  Systolic and diastolic CHF  Atrial fibrillation on warfarin  Outpatient HD prescription: [facility], [facility].  Uses right upper arm AV graft.  BFR 450, DFR A1.5.  EDW 129 kg.  Time 4.5 hours.  On a 1K bath.  No sodium modeling.  No UF profiling.  No heparin.  Hectorol 7 mcg every treatment.  Currently not receiving ESA.  Has required recent intervention on AV graft.  Stent placed on 10/13 at [facility].    Creatinine, Ser (mg/dL)  Date Value  07/28/2020 10.21 (H)  07/27/2020 9.20 (H)  10/20/2019 14.58 (HH)  08/01/2019 12.86 (H)  07/21/2019 7.62 (HH)  06/02/2019 7.78 (H)  06/02/2019 8.00 (H)  09/27/2018 6.15 (H)  04/12/2018 10.47 (H)  04/11/2018 11.48 (H)  ]  ROS Balance of 12 systems is negative w/ exceptions as above  PMH  Past Medical History:  Diagnosis Date  . Allergic rhinitis   . Allergy   . Anemia   . Aortic insufficiency    a. Echo 7/16:  Mod LVH, EF 50-55%, mild to mod AI, severe LAE, PASP 57 mmHg (LV ID end diastolic 69.6 mm);  b. Echo 2/17:  Mod LVH, EF 50-55%, mod AI, MAC, mild MR, mod LAE, mild to mod TR, PASP 63 mmHg  . Bilateral carpal tunnel syndrome   . Cataract    removed both eyes   . CHF (congestive heart failure) ([place])   . Claustrophobia    . COPD (chronic obstructive pulmonary disease) ([place])   . Diabetes mellitus    Type II  . Diabetic peripheral neuropathy ([place])   . Diabetic retinopathy ([place])   . Discitis of lumbar region    resolved  . DVT (deep venous thrombosis) ([place]) 10/07/2015   LLE  . Endocarditis 11/28/14  . ESRD (end stage renal disease) on dialysis [facility])    s/p renal transplant in 2009, failed in 2016  . Gait disorder   . Gastroparesis   . GERD (gastroesophageal reflux disease)   . Gout   . Hearing difficulty   . Hemodialysis patient ([place])    tuesday, thursday, saturday   . Hyperlipidemia   . Hypertension   . Incomplete bladder emptying   . Leg pain 02/05/11   with walking  . Morbid obesity ([place])   . Obstructive sleep apnea    not using CPAP- lost 100lbs  . Osteomyelitis ([place])   . Posttraumatic stress disorder   . Rotator cuff disorder   . Secondary hyperparathyroidism ([place])   . Sepsis ([place])    Postive blood cultures -   . Sleep apnea    no cpap  . Urethral stricture   . Vitamin D deficiency    PSH  Past Surgical History:  Procedure Laterality Date  . ARTERIOVENOUS GRAFT PLACEMENT Bilateral "several"  .  AV FISTULA PLACEMENT Bilateral 2015  . AV FISTULA PLACEMENT Right 06/13/2019   Procedure: INSERTION OF ARTERIOVENOUS (AV) GORE-TEX GRAFT RIGHT  ARM;  Surgeon: Elam Dutch, MD;  Location: [facility];  Service: Vascular;  Laterality: Right;  . BASCILIC VEIN TRANSPOSITION Right 09/27/2015  . [place] Right 09/27/2015   Procedure: BASILIC VEIN TRANSPOSITION right;  Surgeon: Mal Misty, MD;  Location: [facility];  Service: Vascular;  Laterality: Right;  . CATARACT EXTRACTION W/ INTRAOCULAR LENS  IMPLANT, BILATERAL Bilateral   . CYSTOSCOPY W/ INTERNAL URETHROTOMY  09/2014  . IR THROMBECTOMY AV FISTULA W/THROMBOLYSIS/PTA INC/SHUNT/IMG RIGHT Right 08/01/2019  . IR US GUIDE VASC ACCESS RIGHT  08/01/2019  . KIDNEY TRANSPLANT  2009  . SIGMOIDOSCOPY    . UPPER EXTREMITY VENOGRAPHY  Bilateral 06/02/2019   Procedure: UPPER EXTREMITY VENOGRAPHY;  Surgeon: Elam Dutch, MD;  Location: [facility];  Service: Cardiovascular;  Laterality: Bilateral;  . UPPER GASTROINTESTINAL ENDOSCOPY     FH  Family History  Problem Relation Age of Onset  . Hypertension Mother   . Diabetes Mother   . Diabetes Father   . Diabetes Brother   . Colon cancer Neg Hx   . Colon polyps Neg Hx   . Esophageal cancer Neg Hx   . Rectal cancer Neg Hx   . Stomach cancer Neg Hx    SH  reports that he has never smoked. He has never used smokeless tobacco. He reports that he does not drink alcohol and does not use drugs. Allergies  Allergies  Allergen Reactions  . Pork-Derived Products Anaphylaxis and Other (See Comments)    Fever also   . Ace Inhibitors Cough  . Hydrocodone Other (See Comments)    Hallucinations  . Pork Allergy     Other reaction(s): Unknown   Home medications Prior to Admission medications   Medication Sig Start Date End Date Taking? Authorizing Provider  Accu-Chek FastClix Lancets MISC USE TO CHECK BLOOD SUGARS 4 TIMES A DAY. Patient taking differently: 1 each by Other route See admin instructions. USE TO CHECK BLOOD SUGARS 4 TIMES A DAY. 10/09/19  Yes Elayne Snare, MD  allopurinol (ZYLOPRIM) 100 MG tablet Take 1 tablet (100 mg total) by mouth 3 (three) times a week. After dialysis 05/17/19  Yes Hoyt Koch, MD  AURYXIA 1 GM 210 MG(Fe) tablet Take 210 mg by mouth 3 (three) times daily with meals.  11/08/19  Yes [provider]  calcium acetate (PHOSLO) 667 MG capsule Take 3 capsules (2,001 mg total) by mouth 3 (three) times daily with meals. 10/26/15  Yes Ghimire, Henreitta Leber, MD  clonazePAM (KLONOPIN) 0.5 MG tablet Take 1 tablet (0.5 mg total) by mouth 2 (two) times daily as needed for anxiety. 07/22/20  Yes Biagio Borg, MD  DROPLET PEN NEEDLES 32G X 4 MM MISC USE  TO INJECT FIVE TIMES DAILY AS DIRECTED Patient taking differently: 1 each by Other  route See admin instructions. Use to inject 5 times daily as directed. 08/08/19  Yes Elayne Snare, MD  glucose blood (ACCU-CHEK GUIDE) test strip 1 each by Other route as needed for other. Use as instructed to check blood sugar 3 times daily. DX:E11.65 03/08/20  Yes Elayne Snare, MD  hydrocortisone 1 % ointment Apply 1 application topically 2 (two) times daily. 10/16/19  Yes Levin Erp, PA  hydrocortisone-pramoxine [facility]) rectal foam Place 1 applicator rectally 2 (two) times daily. 10/16/19  Yes Gatha Mayer, MD  Insulin Pen  Needle 32G X 8 MM MISC Use with insulin x5 times a day Patient taking differently: 1 each by Other route See admin instructions. Use with insulin x5 times a day 08/16/19  Yes Elayne Snare, MD  insulin regular human CONCENTRATED (HUMULIN R U-500 KWIKPEN) 500 UNIT/ML kwikpen INJECT 50 UNITS UNDER THE SKIN AT BREAKFAST, 70 UNITS AT LUNCH, AND 60 UNITS AT DINNER. Patient taking differently: Inject 50-70 Units into the skin See admin instructions. INJECT 50 UNITS UNDER THE SKIN AT BREAKFAST, 70 UNITS AT LUNCH, AND 60 UNITS AT DINNER. 07/09/20  Yes Elayne Snare, MD  omeprazole (PRILOSEC) 20 MG capsule Take 1 capsule (20 mg total) by mouth daily. Annual appt due in Oct must see provider for future refills 03/01/20  Yes Hoyt Koch, MD  RENVELA 800 MG tablet Take 2,400 mg by mouth 3 (three) times daily with meals.  02/02/17  Yes [provider]  rosuvastatin (CRESTOR) 20 MG tablet Take 1 tablet (20 mg total) by mouth at bedtime. Annual appt due in Oct must see provider for future refills 03/01/20  Yes Hoyt Koch, MD  TOUJEO MAX SOLOSTAR 300 UNIT/ML Solostar Pen INJECT 80 UNITS UNDER THE SKIN IN THE MORNING AND 80 UNITS IN THE EVENING Patient taking differently: Inject 80 Units into the skin See admin instructions. INJECT 80 UNITS UNDER THE SKIN IN THE MORNING AND 80 UNITS IN THE EVENING 06/11/20  Yes Elayne Snare, MD  VELTASSA 8.4 g packet Take 8.4 g by  mouth daily.  12/30/16  Yes [provider]  warfarin (COUMADIN) 5 MG tablet TAKE 1 TABLET EVERY DAY OR AS DIRECTED BY COUMADIN CLINIC. Patient taking differently: Take 5 mg by mouth See admin instructions. TAKE 1 TABLET EVERY DAY OR AS DIRECTED BY COUMADIN CLINIC. 06/11/20  Yes Hoyt Koch, MD  busPIRone (BUSPAR) 5 MG tablet TAKE 1 TABLET (5 MG TOTAL) BY MOUTH 2 (TWO) TIMES DAILY AS NEEDED. Patient not taking: Reported on 07/27/2020 06/06/20   Hoyt Koch, MD  loperamide (IMODIUM) 2 MG capsule Take 1 capsule (2 mg total) by mouth as needed for diarrhea or loose stools. Patient not taking: Reported on 07/27/2020 10/20/19   Levin Erp, PA  mupirocin ointment (BACTROBAN) 2 % Apply 1 application topically daily. Apply to dorsal foot wound daily Patient not taking: Reported on 07/27/2020 05/29/20   Criselda Peaches, DPM  sodium zirconium cyclosilicate (LOKELMA) 10 g PACK packet Take 10 g by mouth daily.  12/26/19   [provider]    Current Medications Scheduled Meds: . aspirin  81 mg Oral Daily  . atropine      . Chlorhexidine Gluconate Cloth  6 each Topical Daily  . clopidogrel  75 mg Oral Q breakfast  . ferric citrate  210 mg Oral TID WC  . insulin aspart  0-15 Units Subcutaneous TID WC  . insulin aspart  0-5 Units Subcutaneous QHS  . insulin glargine  80 Units Subcutaneous BID  . insulin regular human CONCENTRATED  50 Units Subcutaneous TID WC  . pantoprazole  40 mg Oral Daily  . patiromer  8.4 g Oral Daily  . rosuvastatin  40 mg Oral QHS  . sevelamer carbonate  2,400 mg Oral TID WC  . sodium chloride flush  3 mL Intravenous Q12H   Continuous Infusions: . sodium chloride     PRN Meds:.sodium chloride, acetaminophen, clonazePAM, sodium chloride flush  CBC Recent Labs  Lab 07/27/20 1122 07/28/20 0121  WBC 8.4 8.4  HGB  12.0* 11.3*  HCT 39.9 36.1*  MCV 98.5 95.0  PLT 247 683   Basic Metabolic Panel Recent Labs  Lab 07/27/20 1122  07/28/20 0121  NA 136 132*  K 4.3 5.1  CL 91* 92*  CO2 30 26  GLUCOSE 148* 330*  BUN 49* 57*  CREATININE 9.20* 10.21*  CALCIUM 8.8* 8.4*    Physical Exam  Blood pressure (!) 127/96, pulse 92, temperature 98.3 F (36.8 C), resp. rate 14, height _0  (1.778 m), weight 129 kg, SpO2 95 %. GEN: Obese, NAD, in chair, in ICU ENT: NCAT EYES: EOMI CV: Regular, normal S1 and S2 PULM: Mild dyspnea, clear bilaterally ABD: Soft, nontender SKIN: No rashes or lesions EXT: Trace to 1+ lower extremity edema VASCULAR: RUE AV graft with bruit and thrill   Assessment 14M ESRD THS s/p NSTEMI, PCI to LAD 07/27/20  1. ESRD, RUE AVF, THS, [place] 2. CAD, status post NSTEMI status post PCI to LAD 10/16; on clopidogrel, aspirin, warfarin, per Cardiology 3. Anemia, stable, not on ESA 4. Chronic hyperkalemia, outpatient 1K bath, patiromer 5. CKD-BMD: Calcium stable.  Continue outpatient binders.  Check phosphorus. 6. Hypertension, BP stable 7. DM 2 8. Hypertension  Plan 1. HD today: 2K, 4L UF, no heparin, AVG, 400/600.   2. Will follow along   [name]  07/28/2020, 9:21 AM

## 2020-07-29 ENCOUNTER — Inpatient Hospital Stay (HOSPITAL_COMMUNITY): Payer: Medicare HMO

## 2020-07-29 ENCOUNTER — Encounter (HOSPITAL_COMMUNITY): Payer: Self-pay | Admitting: Cardiovascular Disease

## 2020-07-29 DIAGNOSIS — N186 End stage renal disease: Secondary | ICD-10-CM | POA: Diagnosis not present

## 2020-07-29 DIAGNOSIS — I5021 Acute systolic (congestive) heart failure: Secondary | ICD-10-CM

## 2020-07-29 DIAGNOSIS — G4733 Obstructive sleep apnea (adult) (pediatric): Secondary | ICD-10-CM | POA: Diagnosis not present

## 2020-07-29 DIAGNOSIS — I214 Non-ST elevation (NSTEMI) myocardial infarction: Secondary | ICD-10-CM

## 2020-07-29 LAB — ECHOCARDIOGRAM COMPLETE
Area-P 1/2: 4.49 cm2
Height: 70 in
S' Lateral: 3.5 cm
Weight: 4589.1 oz

## 2020-07-29 LAB — GLUCOSE, CAPILLARY
Glucose-Capillary: 69 mg/dL — ABNORMAL LOW (ref 70–99)
Glucose-Capillary: 43 mg/dL — CL (ref 70–99)
Glucose-Capillary: 95 mg/dL (ref 70–99)
Glucose-Capillary: 105 mg/dL — ABNORMAL HIGH (ref 70–99)
Glucose-Capillary: 292 mg/dL — ABNORMAL HIGH (ref 70–99)
Glucose-Capillary: 183 mg/dL — ABNORMAL HIGH (ref 70–99)
Glucose-Capillary: 172 mg/dL — ABNORMAL HIGH (ref 70–99)

## 2020-07-29 LAB — HEMOGLOBIN A1C
Hgb A1c MFr Bld: 6.8 % — ABNORMAL HIGH (ref 4.8–5.6)
Mean Plasma Glucose: 148 mg/dL

## 2020-07-29 LAB — POCT ACTIVATED CLOTTING TIME
Activated Clotting Time: 703 seconds
Activated Clotting Time: 373 seconds
Activated Clotting Time: 373 seconds

## 2020-07-29 LAB — MAGNESIUM: Magnesium: 2.4 mg/dL (ref 1.7–2.4)

## 2020-07-29 MED ORDER — ONDANSETRON HCL 4 MG/2ML IJ SOLN
4.0000 mg | Freq: Once | INTRAMUSCULAR | Status: AC
  Administered 2020-07-29: 4 mg via INTRAVENOUS

## 2020-07-29 MED ORDER — ATROPINE SULFATE 1 MG/10ML IJ SOSY
PREFILLED_SYRINGE | INTRAMUSCULAR | Status: AC
  Administered 2020-07-29: 1 mg
  Filled 2020-07-29: qty 10

## 2020-07-29 MED ORDER — INSULIN GLARGINE 100 UNIT/ML ~~LOC~~ SOLN
40.0000 [IU] | Freq: Two times a day (BID) | SUBCUTANEOUS | Status: DC
  Administered 2020-07-29 – 2020-07-30 (×3): 40 [IU] via SUBCUTANEOUS
  Filled 2020-07-29 (×6): qty 0.4

## 2020-07-29 MED ORDER — DOPAMINE-DEXTROSE 3.2-5 MG/ML-% IV SOLN
0.0000 ug/kg/min | INTRAVENOUS | Status: DC
  Administered 2020-07-29: 5 ug/kg/min via INTRAVENOUS
  Administered 2020-07-30: 15 ug/kg/min via INTRAVENOUS
  Filled 2020-07-29 (×2): qty 250

## 2020-07-29 MED ORDER — WARFARIN - PHARMACIST DOSING INPATIENT: Freq: Every day | Status: DC

## 2020-07-29 MED ORDER — ONDANSETRON HCL 4 MG/2ML IJ SOLN
INTRAMUSCULAR | Status: AC
  Filled 2020-07-29: qty 2

## 2020-07-29 MED ORDER — ATORVASTATIN CALCIUM 80 MG PO TABS
80.0000 mg | ORAL_TABLET | Freq: Every day | ORAL | Status: DC
  Administered 2020-07-29 – 2020-08-04 (×6): 80 mg via ORAL
  Filled 2020-07-29 (×6): qty 1

## 2020-07-29 MED ORDER — WARFARIN SODIUM 5 MG PO TABS
5.0000 mg | ORAL_TABLET | Freq: Once | ORAL | Status: AC
  Administered 2020-07-29: 5 mg via ORAL
  Filled 2020-07-29: qty 1

## 2020-07-29 MED ORDER — INSULIN REGULAR HUMAN (CONC) 500 UNIT/ML ~~LOC~~ SOPN
40.0000 [IU] | PEN_INJECTOR | Freq: Three times a day (TID) | SUBCUTANEOUS | Status: DC
  Administered 2020-07-29 – 2020-07-30 (×2): 40 [IU] via SUBCUTANEOUS
  Filled 2020-07-29 (×2): qty 3

## 2020-07-29 NOTE — Progress Notes (Signed)
ANTICOAGULATION CONSULT NOTE - Initial Consult  Pharmacy Consult for warfarin Indication: atrial fibrillation  Allergies  Allergen Reactions  . Pork-Derived Products Anaphylaxis and Other (See Comments)    Fever also   . Ace Inhibitors Cough  . Hydrocodone Other (See Comments)    Hallucinations  . Pork Allergy     Other reaction(s): Unknown    Patient Measurements: Height: _0  (177.8 cm) Weight: 130.1 kg (286 lb 13.1 oz) IBW/kg (Calculated) : 73  Vital Signs: Temp: 98.6 F (37 C) (10/17 2330) Temp Source: Oral (10/17 2330) BP: 82/55 (10/17 2330) Pulse Rate: 88 (10/18 0600)  Labs: Recent Labs    07/27/20 1122 07/27/20 1320 07/28/20 0121  HGB 12.0*  --  11.3*  HCT 39.9  --  36.1*  PLT 247  --  240  LABPROT  --  20.4*  --   INR  --  1.8*  --   CREATININE 9.20*  --  10.21*  TROPONINIHS >27,000* >27,000*  --     Estimated Creatinine Clearance: 10.9 mL/min (A) (by C-G formula based on SCr of 10.21 mg/dL (H)).   Medical History: Past Medical History:  Diagnosis Date  . Allergic rhinitis   . Allergy   . Anemia   . Aortic insufficiency    a. Echo 7/16:  Mod LVH, EF 50-55%, mild to mod AI, severe LAE, PASP 57 mmHg (LV ID end diastolic 61.4 mm);  b. Echo 2/17:  Mod LVH, EF 50-55%, mod AI, MAC, mild MR, mod LAE, mild to mod TR, PASP 63 mmHg  . Bilateral carpal tunnel syndrome   . Cataract    removed both eyes   . CHF (congestive heart failure) ([place])   . Claustrophobia   . COPD (chronic obstructive pulmonary disease) ([place])   . Diabetes mellitus    Type II  . Diabetic peripheral neuropathy ([place])   . Diabetic retinopathy ([place])   . Discitis of lumbar region    resolved  . DVT (deep venous thrombosis) ([place]) 10/07/2015   LLE  . Endocarditis 11/28/14  . ESRD (end stage renal disease) on dialysis [facility])    s/p renal transplant in 2009, failed in 2016  . Gait disorder   . Gastroparesis   . GERD (gastroesophageal reflux disease)   . Gout   . Hearing difficulty    . Hemodialysis patient ([place])    tuesday, thursday, saturday   . Hyperlipidemia   . Hypertension   . Incomplete bladder emptying   . Leg pain 02/05/11   with walking  . Morbid obesity ([place])   . Obstructive sleep apnea    not using CPAP- lost 100lbs  . Osteomyelitis ([place])   . Posttraumatic stress disorder   . Rotator cuff disorder   . Secondary hyperparathyroidism ([place])   . Sepsis ([place])    Postive blood cultures -   . Sleep apnea    no cpap  . Urethral stricture   . Vitamin D deficiency     Medications:  Medications Prior to Admission  Medication Sig Dispense Refill Last Dose  . Accu-Chek FastClix Lancets MISC USE TO CHECK BLOOD SUGARS 4 TIMES A DAY. (Patient taking differently: 1 each by Other route See admin instructions. USE TO CHECK BLOOD SUGARS 4 TIMES A DAY.) 360 each 4 07/27/2020 at Unknown time  . allopurinol (ZYLOPRIM) 100 MG tablet Take 1 tablet (100 mg total) by mouth 3 (three) times a week. After dialysis 30 tablet 6 Past Week at Unknown time  . AURYXIA 1 GM  210 MG(Fe) tablet Take 210 mg by mouth 3 (three) times daily with meals.    07/26/2020 at Unknown time  . calcium acetate (PHOSLO) 667 MG capsule Take 3 capsules (2,001 mg total) by mouth 3 (three) times daily with meals. 270 capsule 0 07/26/2020 at Unknown time  . clonazePAM (KLONOPIN) 0.5 MG tablet Take 1 tablet (0.5 mg total) by mouth 2 (two) times daily as needed for anxiety. 60 tablet 0 07/26/2020 at Unknown time  . DROPLET PEN NEEDLES 32G X 4 MM MISC USE  TO INJECT FIVE TIMES DAILY AS DIRECTED (Patient taking differently: 1 each by Other route See admin instructions. Use to inject 5 times daily as directed.) 500 each 1 07/26/2020 at Unknown time  . glucose blood (ACCU-CHEK GUIDE) test strip 1 each by Other route as needed for other. Use as instructed to check blood sugar 3 times daily. DX:E11.65 300 each 1 07/27/2020 at Unknown time  . hydrocortisone 1 % ointment Apply 1 application topically 2 (two) times daily.  30 g 0 Past Week at Unknown time  . hydrocortisone-pramoxine (PROCTOFOAM-HC) rectal foam Place 1 applicator rectally 2 (two) times daily. 30 g 0 Past Week at Unknown time  . Insulin Pen Needle 32G X 8 MM MISC Use with insulin x5 times a day (Patient taking differently: 1 each by Other route See admin instructions. Use with insulin x5 times a day) 500 each 1 07/26/2020 at Unknown time  . insulin regular human CONCENTRATED (HUMULIN R U-500 KWIKPEN) 500 UNIT/ML kwikpen INJECT 50 UNITS UNDER THE SKIN AT BREAKFAST, 70 UNITS AT LUNCH, AND 60 UNITS AT DINNER. (Patient taking differently: Inject 50-70 Units into the skin See admin instructions. INJECT 50 UNITS UNDER THE SKIN AT BREAKFAST, 70 UNITS AT LUNCH, AND 60 UNITS AT DINNER.) 12 mL 0 07/26/2020 at Unknown time  . omeprazole (PRILOSEC) 20 MG capsule Take 1 capsule (20 mg total) by mouth daily. Annual appt due in Oct must see provider for future refills 90 capsule 1 07/26/2020 at Unknown time  . RENVELA 800 MG tablet Take 2,400 mg by mouth 3 (three) times daily with meals.    07/26/2020 at Unknown time  . rosuvastatin (CRESTOR) 20 MG tablet Take 1 tablet (20 mg total) by mouth at bedtime. Annual appt due in Oct must see provider for future refills 90 tablet 1 07/26/2020 at Unknown time  . TOUJEO MAX SOLOSTAR 300 UNIT/ML Solostar Pen INJECT 80 UNITS UNDER THE SKIN IN THE MORNING AND 80 UNITS IN THE EVENING (Patient taking differently: Inject 80 Units into the skin See admin instructions. INJECT 80 UNITS UNDER THE SKIN IN THE MORNING AND 80 UNITS IN THE EVENING) 48 mL 1 07/26/2020 at Unknown time  . VELTASSA 8.4 g packet Take 8.4 g by mouth daily.    07/26/2020 at Unknown time  . warfarin (COUMADIN) 5 MG tablet TAKE 1 TABLET EVERY DAY OR AS DIRECTED BY COUMADIN CLINIC. (Patient taking differently: Take 5 mg by mouth See admin instructions. TAKE 1 TABLET EVERY DAY OR AS DIRECTED BY COUMADIN CLINIC.) 100 tablet 0 07/26/2020 at Unknown time  . busPIRone (BUSPAR) 5  MG tablet TAKE 1 TABLET (5 MG TOTAL) BY MOUTH 2 (TWO) TIMES DAILY AS NEEDED. (Patient not taking: Reported on 07/27/2020) 180 tablet 1 Not Taking at Unknown time  . loperamide (IMODIUM) 2 MG capsule Take 1 capsule (2 mg total) by mouth as needed for diarrhea or loose stools. (Patient not taking: Reported on 07/27/2020) 30 capsule 0 Not Taking at  Unknown time  . mupirocin ointment (BACTROBAN) 2 % Apply 1 application topically daily. Apply to dorsal foot wound daily (Patient not taking: Reported on 07/27/2020) 30 g 2 Not Taking at Unknown time  . sodium zirconium cyclosilicate (LOKELMA) 10 g PACK packet Take 10 g by mouth daily.    unk   Scheduled:  . aspirin  81 mg Oral Daily  . atorvastatin  80 mg Oral Daily  . Chlorhexidine Gluconate Cloth  6 each Topical Daily  . clopidogrel  75 mg Oral Q breakfast  . ferric citrate  210 mg Oral TID WC  . insulin aspart  0-15 Units Subcutaneous TID WC  . insulin aspart  0-5 Units Subcutaneous QHS  . insulin glargine  40 Units Subcutaneous BID  . insulin regular human CONCENTRATED  40 Units Subcutaneous TID WC  . pantoprazole  40 mg Oral Daily  . patiromer  8.4 g Oral Daily  . sevelamer carbonate  2,400 mg Oral TID WC  . sodium chloride flush  3 mL Intravenous Q12H    Assessment: 55 yo male with NSTEMI s/p cath with DES. He is on coumadin PTA for afib and pharmacy consulted to restart. Plans noted to drop ASA in 2-4 weeks -INR= 1.8 -Hg= 11.3  Home dose: 87m/d  Goal of Therapy:  INR 2-3 Monitor platelets by anticoagulation protocol: Yes   Plan:  -Warfarin 531mpo today -Daily PT/INR  AnHildred LaserPharmD Clinical Pharmacist **Pharmacist phone directory can now be found on [email or web address] ([email or web address]).  Listed under [place]

## 2020-07-29 NOTE — Progress Notes (Signed)
[facility] Progress Note   Subjective:  Seen in room - getting echo. Had HD yesterday. Denies dyspnea today although seems to be a little dyspneic with speaking - on nasal O2. Very hypotensive, per notes.  Objective Vitals:   07/29/20 0300 07/29/20 0400 07/29/20 0500 07/29/20 0600  BP:      Pulse: 86 88 88 88  Resp: 16 14 13 13   Temp:      TempSrc:      SpO2: 100% 100% 100% 100%  Weight:      Height:       Physical Exam General: Chronically ill appearing man, NAD. Nasal O2. Heart: RRR; no murmur Lungs: CTA anteriorly Abdomen: soft, non-tender Extremities: No LE edema Dialysis Access: RUE AVG + bruit  Additional Objective Labs: Basic Metabolic Panel: Recent Labs  Lab 07/27/20 1122 07/28/20 0121  NA 136 132*  K 4.3 5.1  CL 91* 92*  CO2 30 26  GLUCOSE 148* 330*  BUN 49* 57*  CREATININE 9.20* 10.21*  CALCIUM 8.8* 8.4*   CBC: Recent Labs  Lab 07/27/20 1122 07/28/20 0121  WBC 8.4 8.4  HGB 12.0* 11.3*  HCT 39.9 36.1*  MCV 98.5 95.0  PLT 247 240   Studies/Results: CARDIAC CATHETERIZATION  Addendum Date: 07/27/2020    Lat Ramus lesion is 100% stenosed.  1st Mrg lesion is 100% stenosed.  Prox LAD lesion is 99% stenosed.  A drug-eluting stent was successfully placed using a STENT RESOLUTE ONYX 3.5X18.  Post intervention, there is a 0% residual stenosis.  1. Severe proximal to mid LAD stenosis 2. Chronic occlusion of the distal intermediate branch, small in caliber 3. Complete occlusion of the distal segment of the first obtuse marginal branch. Cannot exclude that this was the infarct vessel but the patient was pain free and this vessel did not appear large enough for PCI. There is collateral filling of lateral wall branches from the RCA 4. Successful PTCA/DES x 1 proximal to mid LAD Recommendations: Will admit to the ICU. Will continue DAPT with ASA and Plavix. Resume coumadin prior to discharge. I would plan to use triple therapy with ASA, Plavix  and coumadin for one month then stop ASA. Continue statin. Will not start a beta blocker given his soft BP. Echo tomorrow.   Result Date: 07/27/2020  Lat Ramus lesion is 100% stenosed.  1st Mrg lesion is 100% stenosed.  Prox LAD lesion is 99% stenosed.  1. Severe proximal to mid LAD stenosis 2. Chronic occlusion of the distal intermediate branch, small in caliber 3. Complete occlusion of the distal segment of the first obtuse marginal branch. Cannot exclude that this was the infarct vessel but the patient was pain free and this vessel did not appear large enough for PCI. There is collateral filling of lateral wall branches from the RCA 4. Successful PTCA/DES x 1 proximal to mid LAD Recommendations: Will admit to the ICU. Will continue DAPT with ASA and Plavix. Resume coumadin prior to discharge. I would plan to use triple therapy with ASA, Plavix and coumadin for one month then stop ASA. Continue statin. Will not start a beta blocker given his soft BP. Echo tomorrow.   DG Chest Portable 1 View  Result Date: 07/27/2020 CLINICAL DATA:  Chest pain and shortness of breath EXAM: PORTABLE CHEST 1 VIEW COMPARISON:  April 11, 2018 FINDINGS: There is ill-defined airspace opacity in the left base. Lungs are otherwise clear. There is cardiomegaly with pulmonary vascularity within normal limits. No adenopathy. No bone lesions.  No pneumothorax IMPRESSION: Opacity left base concerning for potential pneumonia. Lungs otherwise clear. There is cardiac enlargement. No adenopathy appreciable. Electronically Signed   By: Lowella Grip III M.D.   On: 07/27/2020 12:30   Medications: . sodium chloride     . aspirin  81 mg Oral Daily  . atorvastatin  80 mg Oral Daily  . Chlorhexidine Gluconate Cloth  6 each Topical Daily  . clopidogrel  75 mg Oral Q breakfast  . ferric citrate  210 mg Oral TID WC  . insulin aspart  0-15 Units Subcutaneous TID WC  . insulin aspart  0-5 Units Subcutaneous QHS  . insulin glargine  40  Units Subcutaneous BID  . insulin regular human CONCENTRATED  40 Units Subcutaneous TID WC  . pantoprazole  40 mg Oral Daily  . patiromer  8.4 g Oral Daily  . sevelamer carbonate  2,400 mg Oral TID WC  . sodium chloride flush  3 mL Intravenous Q12H  . warfarin  5 mg Oral ONCE-1600  . Warfarin - Pharmacist Dosing Inpatient   Does not apply q1600    Dialysis Orders: TTS [facility] 4.5hr, 450/A1.5, EDW 129kg, 1K/2.25Ca bath, AVG, no heparin - Hectoral 73mcg Iv q HD - No ESA recently  Assessment/Plan: 1. NSTEMI/CAD: s/p LHC with DES placement to proximal LAD 10/16. Had 100% occlusion of 2 smaller branches as well - no intervention options. On DAPT + warfarin. Echo pending. 2. ESRD: Dialyzed yesterday. Next HD 10/19 per TTS schedule. 3. HyPOTN/volume: Chronic hypotension, worse here.  4. Anemia: Hgb 11.3 - no ESA needed for now. 5. Secondary hyperparathyroidism: Ca ok, Phos pending. Continue Auryxia as binder. 6. T2DM: Insulin per primary. 7. A-Fib: Warfarin per primary.  Veneta Penton, PA-C 07/29/2020, 11:25 AM  [employer]

## 2020-07-29 NOTE — Progress Notes (Signed)
Brief Note:  Called to bedside for HR in the 42A and systolic BP in the 83M. The patient had received a dose of Klonopin a few minutes prior for anxiety.  Patient placed in Trendelenburg position. Given 1mg  of Atropine IV and started on Dopamine infusion. ECG showed atrial flutter with no new ischemic changes.  Also had IV fluids running. Suspect vasovagal episode. HR responded to drugs with improvement into the 50s. SBP >80mmHg.  Patient alert & oriented x 4 Warm extremities Not SOB or diaphoretic Lungs clear Admits to lightheadedness  PLAN -Continue IV dopamine infusion -Transfer to ICU -Orthostatic VS  [name] Butch Laymond Purser, MD, [facility] Overnight cross cover

## 2020-07-29 NOTE — Significant Event (Addendum)
Rapid Response Event Note   Reason for Call :  Bradycardia(40s), hypotension(69/39), and chest pain.  Initial Focused Assessment:  Pt laying in bed with eyes open. Pt is agitated and moaning. He is A and O x3. He says he is having R sided chest pain 9/10. Lungs clear and diminished. Skin cool to touch. Pt is also dry heaving.    Interventions:  1amp atropine given PTA RRT-HR did not change EKG-Aflutter with variable AV block with ventricular escape complexes, rate-41 Dopamine gtt started at 67mcg @ 2258. Zofran 4mg  IV   Plan of Care:  Tx to ICU   Event Summary:   MD Notified: Dr. Paticia Stack notified PTA RRT and was at bedside on my arrival. Call [place], Heike Pounds [place], RN

## 2020-07-29 NOTE — Progress Notes (Signed)
CARDIAC REHAB PHASE I   Pt was seated in bed with a blood pressure of 83/50. Retook blood pressure 82/48. Per PA they wanted him to walk. Had pt stand and took BP 90/66. Pt stated he felt dizzy and light headed once he stood. Pt needed to sit. Retook BP after standing and BP was 55/25. Not able to walk with pt due to BP low and symptomatic. Will check back in after lunch and try to walk if not symptomatic. Pt's RN and MD are aware of low BP.  [phone] Rick Duff, MS, ACSM CEP

## 2020-07-29 NOTE — Progress Notes (Signed)
Approximately 1045, RN was notified by therapy that pt was experiencing worsening hypotension with movement. Therapy had also notified the MD. Patient was assisted back to into bed and BP rechecked. Agricultural consultant notified. At 1100, RRT was notified and came to bedside. NS 542mL bolus initiated and pt was placed in trendelenburg position. PA notified and Tamala Julian MD came to bedside to evaluate pt.  Will continue to monitor the patient closely. \

## 2020-07-29 NOTE — Progress Notes (Signed)
  Echocardiogram 2D Echocardiogram has been performed.  [name] [name] 07/29/2020, 9:54 AM

## 2020-07-29 NOTE — Progress Notes (Addendum)
The patient has been seen in conjunction with Vin Bhagat, PAC. All aspects of care have been considered and discussed. The patient has been personally interviewed, examined, and all clinical data has been reviewed.   He has not had recurrence of chest discomfort similar to admission.  Echo has not been fully interpreted.  EF now appears to be 35 to 40% with anteroapical akinesis.  Since dialysis, blood pressures have been soft.  He attempted to get up and ambulate with cardiac rehab and was found to have systolic blood pressure of 65 mmHg.  HYPOTENSION/soft blood pressure: Multifactorial including probable autonomic neuropathy, relative intravascular volume depletion from dialysis, and vascular issues that make obtaining accurate blood pressure difficult.  NEW SYSTOLIC HEART failure: Guideline directed therapy for systolic dysfunction will be threatened by the patient soft blood pressures.  Perhaps the only agent will be an SGLT2 if this can be done safely without excessive risk of hypoglycemia.  PLAN: 500 cc of IV saline; supine with legs elevated; knee high compression stockings.  He will likely need to have a new higher dry weight to allow better LV filling given depression in LV systolic function.  Overall, prognosis is guarded.   Progress Note  Patient Name: [name] [name] Date of Encounter: 07/29/2020  [facility] Cardiologist: Lauree Chandler, MD   Subjective   No recurrent chest pain. Had dialysis yesterday. BP low.   Inpatient Medications    Scheduled Meds: . aspirin  81 mg Oral Daily  . Chlorhexidine Gluconate Cloth  6 each Topical Daily  . clopidogrel  75 mg Oral Q breakfast  . ferric citrate  210 mg Oral TID WC  . insulin aspart  0-15 Units Subcutaneous TID WC  . insulin aspart  0-5 Units Subcutaneous QHS  . insulin glargine  40 Units Subcutaneous BID  . insulin regular human CONCENTRATED  40 Units Subcutaneous TID WC  . pantoprazole  40 mg Oral Daily  .  patiromer  8.4 g Oral Daily  . rosuvastatin  40 mg Oral QHS  . sevelamer carbonate  2,400 mg Oral TID WC  . sodium chloride flush  3 mL Intravenous Q12H   Continuous Infusions: . sodium chloride     PRN Meds: sodium chloride, acetaminophen, clonazePAM, sodium chloride flush   Vital Signs    Vitals:   07/29/20 0300 07/29/20 0400 07/29/20 0500 07/29/20 0600  BP:      Pulse: 86 88 88 88  Resp: 16 14 13 13   Temp:      TempSrc:      SpO2: 100% 100% 100% 100%  Weight:      Height:        Intake/Output Summary (Last 24 hours) at 07/29/2020 1013 Last data filed at 07/28/2020 1730 Gross per 24 hour  Intake 240 ml  Output 3153 ml  Net -2913 ml   Last 3 Weights 07/28/2020 07/28/2020 07/27/2020  Weight (lbs) 286 lb 13.1 oz 296 lb 1.2 oz 284 lb 6.3 oz  Weight (kg) 130.1 kg 134.3 kg 129 kg  Some encounter information is confidential and restricted. Go to Review Flowsheets activity to see all data.      Telemetry    Atrial flutter 100-120s - Personally Reviewed  ECG    N/A  Physical Exam   GEN: No acute distress.   Neck: No JVD Cardiac: regular, no murmurs, rubs, or gallops.  Respiratory: Clear to auscultation bilaterally. GI: Soft, nontender, non-distended  MS: No edema; No deformity. Neuro:  Nonfocal  Psych:  Normal affect   Labs    High Sensitivity Troponin:   Recent Labs  Lab 07/27/20 1122 07/27/20 1320  TROPONINIHS >27,000* >27,000*      Chemistry Recent Labs  Lab 07/27/20 1122 07/28/20 0121  NA 136 132*  K 4.3 5.1  CL 91* 92*  CO2 30 26  GLUCOSE 148* 330*  BUN 49* 57*  CREATININE 9.20* 10.21*  CALCIUM 8.8* 8.4*  GFRNONAA 6* 5*  ANIONGAP 15 14     Hematology Recent Labs  Lab 07/27/20 1122 07/28/20 0121  WBC 8.4 8.4  RBC 4.05* 3.80*  HGB 12.0* 11.3*  HCT 39.9 36.1*  MCV 98.5 95.0  MCH 29.6 29.7  MCHC 30.1 31.3  RDW 18.5* 18.2*  PLT 247 240    Radiology    CARDIAC CATHETERIZATION  Addendum Date: 07/27/2020    Lat Ramus  lesion is 100% stenosed.  1st Mrg lesion is 100% stenosed.  Prox LAD lesion is 99% stenosed.  A drug-eluting stent was successfully placed using a STENT RESOLUTE ONYX 3.5X18.  Post intervention, there is a 0% residual stenosis.  1. Severe proximal to mid LAD stenosis 2. Chronic occlusion of the distal intermediate branch, small in caliber 3. Complete occlusion of the distal segment of the first obtuse marginal branch. Cannot exclude that this was the infarct vessel but the patient was pain free and this vessel did not appear large enough for PCI. There is collateral filling of lateral wall branches from the RCA 4. Successful PTCA/DES x 1 proximal to mid LAD Recommendations: Will admit to the ICU. Will continue DAPT with ASA and Plavix. Resume coumadin prior to discharge. I would plan to use triple therapy with ASA, Plavix and coumadin for one month then stop ASA. Continue statin. Will not start a beta blocker given his soft BP. Echo tomorrow.   Result Date: 07/27/2020  Lat Ramus lesion is 100% stenosed.  1st Mrg lesion is 100% stenosed.  Prox LAD lesion is 99% stenosed.  1. Severe proximal to mid LAD stenosis 2. Chronic occlusion of the distal intermediate branch, small in caliber 3. Complete occlusion of the distal segment of the first obtuse marginal branch. Cannot exclude that this was the infarct vessel but the patient was pain free and this vessel did not appear large enough for PCI. There is collateral filling of lateral wall branches from the RCA 4. Successful PTCA/DES x 1 proximal to mid LAD Recommendations: Will admit to the ICU. Will continue DAPT with ASA and Plavix. Resume coumadin prior to discharge. I would plan to use triple therapy with ASA, Plavix and coumadin for one month then stop ASA. Continue statin. Will not start a beta blocker given his soft BP. Echo tomorrow.   DG Chest Portable 1 View  Result Date: 07/27/2020 CLINICAL DATA:  Chest pain and shortness of breath EXAM: PORTABLE  CHEST 1 VIEW COMPARISON:  April 11, 2018 FINDINGS: There is ill-defined airspace opacity in the left base. Lungs are otherwise clear. There is cardiomegaly with pulmonary vascularity within normal limits. No adenopathy. No bone lesions. No pneumothorax IMPRESSION: Opacity left base concerning for potential pneumonia. Lungs otherwise clear. There is cardiac enlargement. No adenopathy appreciable. Electronically Signed   By: Lowella Grip III M.D.   On: 07/27/2020 12:30    Cardiac Studies   CORONARY STENT INTERVENTION  LEFT HEART CATH AND CORONARY ANGIOGRAPHY  Conclusion    Lat Ramus lesion is 100% stenosed.  1st Mrg lesion is 100% stenosed.  Prox LAD lesion is 99%  stenosed.  A drug-eluting stent was successfully placed using a STENT RESOLUTE ONYX 3.5X18.  Post intervention, there is a 0% residual stenosis.   1. Severe proximal to mid LAD stenosis 2. Chronic occlusion of the distal intermediate branch, small in caliber 3. Complete occlusion of the distal segment of the first obtuse marginal branch. Cannot exclude that this was the infarct vessel but the patient was pain free and this vessel did not appear large enough for PCI. There is collateral filling of lateral wall branches from the RCA 4. Successful PTCA/DES x 1 proximal to mid LAD  Recommendations: Will admit to the ICU. Will continue DAPT with ASA and Plavix. Resume coumadin prior to discharge. I would plan to use triple therapy with ASA, Plavix and coumadin for one month then stop ASA. Continue statin. Will not start a beta blocker given his soft BP. Echo tomorrow.  Diagnostic Dominance: Right  Intervention     Patient Profile     54 y.o. male  with hx of ESRD on dialysis, COPD, AI, LBBB, OSA, DM II, chronic atrial flutter, chronic anticoagulation, chronic DVT,  chronic diastolic HF, history of endocarditis, presents with chest pain - found to have NSTEMI --> LAD stent  Assessment & Plan    1. CAD - Hs-Troponin  peaked > 27000. Cath chronic occlusio of distal intermediate and distal 1st OM. Severe proximal to mid LAD stenosis s/p  Successful PTCA/DES x 1. - No recurrent chest pain - Continue triple therapy for 1 month and then stop ASA.  - Continue statin  - Echo done, pending reading  2. HLD - 05/13/2020: Cholesterol 93; HDL 27.60; LDL Cholesterol 43; Triglycerides 113.0; VLDL 22.6  - Given ESRD change Crestor (contraindicated higher dose per pharmacist) to Lipitor 80mg    3. ESRD on HD (TThsat) - Elevated LVEDP during cath - underwent dialysis yesterday - Co-ordinate timing if discharge tomorrow  4. Persistent atrial flutter - Rate relatively stable - Resume coumadin today   5. Hypotension - Not on any medications that can lower his BP - Had dialysis yesterday - given stocking   6. DM - Hypoglycemic this morning - Adjusted insulin - Pending evaluation by diabetic coordinator    For questions or updates, please contact [place] Please consult [email or web address] for contact info under        SignedLeanor Kail, PA  07/29/2020, 10:13 AM

## 2020-07-29 NOTE — Significant Event (Signed)
Rapid Response Event Note   Reason for Call :  Hypotension  Initial Focused Assessment:  Pt lying in bed. AAOx4. Following commands. Moving all extremities appropriately. Extremities are warm. Peripheral pulses are weak. BP location is limited due to pt having old fistula in his left arm and new fistula in his right arm.  BP being checked in pt's left wrist. Skin color is pale. Pt endorses 2/10 chest sensation, he denies discomfort and pain. Pt endorses feeling lightheaded while sitting to edge of bed prior to attempting to ambulate. Pt is noted to be in a-flutter. Lung sounds are clear diminished.   Doppler SBP: 90 to pt's wrist Unable to auscultate BP  VS: T 97.7, BP 75/51 (58), HR 58, RR 13, SpO2 100% on 2LNC CBG 105  Interventions:  -500 cc normal saline bolus -Trendelenburg postioning as pt tolerates  Plan of Care:  -Orthostatic vital signs -Trend VS q10-15 minutes until bolus completion, reevaluate BP and determine frequency following IVF bolus -Monitor pt level of consciousness  Pt now talking about pizza and more interactive. Continue to monitor BP until fluid bolus completion. Notify provider if pt remains hypotensive following 500 cc bolus.  Call rapid response for additional needs  Event Summary:  MD Notified: Dr. Linard Millers Call Time: [place] Time: [place] Time: [place], RN

## 2020-07-30 ENCOUNTER — Inpatient Hospital Stay (HOSPITAL_COMMUNITY): Payer: Medicare HMO

## 2020-07-30 DIAGNOSIS — N186 End stage renal disease: Secondary | ICD-10-CM | POA: Diagnosis not present

## 2020-07-30 DIAGNOSIS — R579 Shock, unspecified: Secondary | ICD-10-CM

## 2020-07-30 DIAGNOSIS — R001 Bradycardia, unspecified: Secondary | ICD-10-CM

## 2020-07-30 DIAGNOSIS — I9589 Other hypotension: Secondary | ICD-10-CM

## 2020-07-30 DIAGNOSIS — I4819 Other persistent atrial fibrillation: Secondary | ICD-10-CM | POA: Diagnosis not present

## 2020-07-30 DIAGNOSIS — E861 Hypovolemia: Secondary | ICD-10-CM

## 2020-07-30 DIAGNOSIS — I214 Non-ST elevation (NSTEMI) myocardial infarction: Secondary | ICD-10-CM | POA: Diagnosis not present

## 2020-07-30 DIAGNOSIS — I5021 Acute systolic (congestive) heart failure: Secondary | ICD-10-CM | POA: Diagnosis not present

## 2020-07-30 LAB — BASIC METABOLIC PANEL
Anion gap: 18 — ABNORMAL HIGH (ref 5–15)
Anion gap: 13 (ref 5–15)
BUN: 62 mg/dL — ABNORMAL HIGH (ref 6–20)
BUN: 68 mg/dL — ABNORMAL HIGH (ref 6–20)
CO2: 23 mmol/L (ref 22–32)
CO2: 24 mmol/L (ref 22–32)
Calcium: 9 mg/dL (ref 8.9–10.3)
Calcium: 8 mg/dL — ABNORMAL LOW (ref 8.9–10.3)
Chloride: 97 mmol/L — ABNORMAL LOW (ref 98–111)
Chloride: 100 mmol/L (ref 98–111)
Creatinine, Ser: 9.11 mg/dL — ABNORMAL HIGH (ref 0.61–1.24)
Creatinine, Ser: 9.55 mg/dL — ABNORMAL HIGH (ref 0.61–1.24)
GFR, Estimated: 6 mL/min — ABNORMAL LOW (ref >60)
GFR, Estimated: 5 mL/min — ABNORMAL LOW (ref >60)
Glucose, Bld: 96 mg/dL (ref 70–99)
Glucose, Bld: 94 mg/dL (ref 70–99)
Potassium: 5 mmol/L (ref 3.5–5.1)
Potassium: 6.2 mmol/L — ABNORMAL HIGH (ref 3.5–5.1)
Sodium: 138 mmol/L (ref 135–145)
Sodium: 137 mmol/L (ref 135–145)

## 2020-07-30 LAB — HEPATIC FUNCTION PANEL
ALT: 26 U/L (ref 0–44)
AST: 40 U/L (ref 15–41)
Albumin: 2.8 g/dL — ABNORMAL LOW (ref 3.5–5.0)
Alkaline Phosphatase: 48 U/L (ref 38–126)
Bilirubin, Direct: 0.4 mg/dL — ABNORMAL HIGH (ref 0.0–0.2)
Indirect Bilirubin: 1.2 mg/dL — ABNORMAL HIGH (ref 0.3–0.9)
Total Bilirubin: 1.6 mg/dL — ABNORMAL HIGH (ref 0.3–1.2)
Total Protein: 6.2 g/dL — ABNORMAL LOW (ref 6.5–8.1)

## 2020-07-30 LAB — CBC
HCT: 43.3 % (ref 39.0–52.0)
HCT: 31.9 % — ABNORMAL LOW (ref 39.0–52.0)
Hemoglobin: 13.1 g/dL (ref 13.0–17.0)
Hemoglobin: 9.5 g/dL — ABNORMAL LOW (ref 13.0–17.0)
MCH: 29.2 pg (ref 26.0–34.0)
MCH: 29.4 pg (ref 26.0–34.0)
MCHC: 30.3 g/dL (ref 30.0–36.0)
MCHC: 29.8 g/dL — ABNORMAL LOW (ref 30.0–36.0)
MCV: 96.4 fL (ref 80.0–100.0)
MCV: 98.8 fL (ref 80.0–100.0)
Platelets: 382 10*3/uL (ref 150–400)
Platelets: 286 10*3/uL (ref 150–400)
RBC: 4.49 MIL/uL (ref 4.22–5.81)
RBC: 3.23 MIL/uL — ABNORMAL LOW (ref 4.22–5.81)
RDW: 19 % — ABNORMAL HIGH (ref 11.5–15.5)
RDW: 18.6 % — ABNORMAL HIGH (ref 11.5–15.5)
WBC: 17.2 10*3/uL — ABNORMAL HIGH (ref 4.0–10.5)
WBC: 11.2 10*3/uL — ABNORMAL HIGH (ref 4.0–10.5)
nRBC: 0 % (ref 0.0–0.2)
nRBC: 0.2 % (ref 0.0–0.2)

## 2020-07-30 LAB — GLUCOSE, CAPILLARY
Glucose-Capillary: 82 mg/dL (ref 70–99)
Glucose-Capillary: 86 mg/dL (ref 70–99)
Glucose-Capillary: 135 mg/dL — ABNORMAL HIGH (ref 70–99)
Glucose-Capillary: 104 mg/dL — ABNORMAL HIGH (ref 70–99)

## 2020-07-30 LAB — CORTISOL: Cortisol, Plasma: 6.7 ug/dL

## 2020-07-30 LAB — LACTIC ACID, PLASMA
Lactic Acid, Venous: 3.6 mmol/L (ref 0.5–1.9)
Lactic Acid, Venous: 2.6 mmol/L (ref 0.5–1.9)

## 2020-07-30 LAB — LIPASE, BLOOD: Lipase: 42 U/L (ref 11–51)

## 2020-07-30 LAB — TROPONIN I (HIGH SENSITIVITY)
Troponin I (High Sensitivity): 8742 ng/L (ref <18)
Troponin I (High Sensitivity): 8935 ng/L (ref <18)

## 2020-07-30 LAB — PROTIME-INR
INR: 1.4 — ABNORMAL HIGH (ref 0.8–1.2)
Prothrombin Time: 16.2 seconds — ABNORMAL HIGH (ref 11.4–15.2)

## 2020-07-30 LAB — MAGNESIUM: Magnesium: 2.3 mg/dL (ref 1.7–2.4)

## 2020-07-30 LAB — PHOSPHORUS: Phosphorus: 5.7 mg/dL — ABNORMAL HIGH (ref 2.5–4.6)

## 2020-07-30 MED ORDER — METOCLOPRAMIDE HCL 5 MG/ML IJ SOLN
5.0000 mg | Freq: Once | INTRAMUSCULAR | Status: AC
  Administered 2020-07-30: 5 mg via INTRAVENOUS
  Filled 2020-07-30: qty 2

## 2020-07-30 MED ORDER — LIDOCAINE HCL (PF) 1 % IJ SOLN
INTRAMUSCULAR | Status: AC
  Filled 2020-07-30: qty 5

## 2020-07-30 MED ORDER — WARFARIN SODIUM 5 MG PO TABS
5.0000 mg | ORAL_TABLET | Freq: Once | ORAL | Status: DC
  Filled 2020-07-30: qty 1

## 2020-07-30 MED ORDER — ALBUMIN HUMAN 5 % IV SOLN
12.5000 g | Freq: Once | INTRAVENOUS | Status: DC
  Filled 2020-07-30: qty 250

## 2020-07-30 MED ORDER — MIDODRINE HCL 5 MG PO TABS: 5.0000 mg | ORAL_TABLET | Freq: Three times a day (TID) | ORAL | Status: DC

## 2020-07-30 MED ORDER — NOREPINEPHRINE 4 MG/250ML-% IV SOLN
0.0000 ug/min | INTRAVENOUS | Status: DC
  Administered 2020-07-30: 14 ug/min via INTRAVENOUS
  Administered 2020-07-30: 10 ug/min via INTRAVENOUS
  Administered 2020-07-31: 2 ug/min via INTRAVENOUS
  Administered 2020-07-31: 6 ug/min via INTRAVENOUS
  Administered 2020-08-01: 5 ug/min via INTRAVENOUS
  Filled 2020-07-30 (×5): qty 250

## 2020-07-30 MED ORDER — ATROPINE SULFATE 1 MG/10ML IJ SOSY
1.0000 mg | PREFILLED_SYRINGE | Freq: Once | INTRAMUSCULAR | Status: AC
  Administered 2020-07-30: 1 mg via INTRAVENOUS
  Filled 2020-07-30: qty 10

## 2020-07-30 MED ORDER — MORPHINE SULFATE (PF) 2 MG/ML IV SOLN
1.0000 mg | Freq: Once | INTRAVENOUS | Status: AC
  Administered 2020-07-30: 1 mg via INTRAVENOUS
  Filled 2020-07-30: qty 1

## 2020-07-30 MED ORDER — MORPHINE SULFATE (PF) 2 MG/ML IV SOLN
0.5000 mg | Freq: Once | INTRAVENOUS | Status: DC
  Filled 2020-07-30: qty 1

## 2020-07-30 MED ORDER — MIDODRINE HCL 5 MG PO TABS
5.0000 mg | ORAL_TABLET | Freq: Three times a day (TID) | ORAL | Status: DC
  Administered 2020-07-30: 5 mg via ORAL
  Filled 2020-07-30: qty 1

## 2020-07-30 MED ORDER — SODIUM CHLORIDE 0.9 % IV SOLN
250.0000 mL | INTRAVENOUS | Status: DC
  Administered 2020-07-30: 250 mL via INTRAVENOUS

## 2020-07-30 MED ORDER — SODIUM CHLORIDE 0.9 % IV SOLN: INTRAVENOUS | Status: DC | PRN

## 2020-07-30 MED FILL — Nitroglycerin IV Soln 100 MCG/ML in D5W: INTRA_ARTERIAL | Qty: 10 | Status: AC

## 2020-07-30 MED FILL — Heparin Sodium (Porcine) Inj 1000 Unit/ML: INTRAMUSCULAR | Qty: 1 | Status: AC

## 2020-07-30 NOTE — Progress Notes (Signed)
[facility] Progress Note   Subjective:  Seen in room, BP's low overnight, some in 02'I - 09'B systolic. Pt nauseous, sitting up in bed. No CP or SOB.   Objective Vitals:   07/30/20 0630 07/30/20 0645 07/30/20 0700 07/30/20 0715  BP: (!) 95/15 98/70 95/60  (!) 92/38  Pulse: (!) 51 (!) 53 (!) 52 (!) 56  Resp: 14 18 20 17   Temp:      TempSrc:      SpO2: 94% 95% 93% (!) 88%  Weight:      Height:       Physical Exam General: Chronically ill appearing man, NAD. Nasal O2. Heart: RRR; no murmur Lungs: CTA anteriorly and post, no rales or wheezing Abdomen: soft, non-tender Extremities: No LE edema Dialysis Access: RUE AVG + bruit  Additional Objective Labs: Basic Metabolic Panel: Recent Labs  Lab 07/27/20 1122 07/28/20 0121 07/30/20 0504  NA 136 132* 138  K 4.3 5.1 5.0  CL 91* 92* 97*  CO2 30 26 23   GLUCOSE 148* 330* 96  BUN 49* 57* 62*  CREATININE 9.20* 10.21* 9.11*  CALCIUM 8.8* 8.4* 9.0  PHOS  --   --  5.7*   CBC: Recent Labs  Lab 07/27/20 1122 07/28/20 0121 07/30/20 0504  WBC 8.4 8.4 17.2*  HGB 12.0* 11.3* 13.1  HCT 39.9 36.1* 43.3  MCV 98.5 95.0 96.4  PLT 247 240 382  Medications: . sodium chloride    . DOPamine 15 mcg/kg/min (07/30/20 0834)   . aspirin  81 mg Oral Daily  . atorvastatin  80 mg Oral Daily  . Chlorhexidine Gluconate Cloth  6 each Topical Daily  . clopidogrel  75 mg Oral Q breakfast  . ferric citrate  210 mg Oral TID WC  . insulin aspart  0-15 Units Subcutaneous TID WC  . insulin aspart  0-5 Units Subcutaneous QHS  . insulin glargine  40 Units Subcutaneous BID  . insulin regular human CONCENTRATED  40 Units Subcutaneous TID WC  . midodrine  5 mg Oral TID WC  . ondansetron      . pantoprazole  40 mg Oral Daily  . patiromer  8.4 g Oral Daily  . sevelamer carbonate  2,400 mg Oral TID WC  . sodium chloride flush  3 mL Intravenous Q12H  . warfarin  5 mg Oral ONCE-1600  . Warfarin - Pharmacist Dosing Inpatient   Does not  apply q1600    Home meds:  - auryxia 1 ac tid/ phoslo 3 ac tid/ renvela 3 ac tid  - lokelma 10 gm qd/  veltassa 8.4gm qd  - crestor 20 hs  - insulin toujeo 80u bid sq/ regular insulin 50- 70u sq tid ac  - protonix 40 qd/ xyloprim 100 tid  - clonazepam 0.5 mg bid prn  - warfarin qd as directed  - prn's/ vitamins/ supplements    Dialysis Orders: TTS - [facility] 4.5h   450/A1.5   129kg   1K/2.25Ca bath   AVG   Hep none  - Hectoral 25mcg Iv q HD - No ESA recently   CXR 10/16 - IMPRESSION: Opacity left base concerning for potential pneumonia. Lungs otherwise clear. There is cardiac enlargement. No adenopathy appreciable.    Assessment/Plan: 1. Hypotension/ severe bradycardia - HR in 40's w/ dopamine support, drops into 30's per RN if dopmaine lowered.  BP's L forearm (same place that OP gets his BP) labile from 35'H - 29'J systolic. At dry wt, lungs clear. Is about 2 days sp  stenting of 100% LAD lesion. Will give NS bolus now 500 cc. Too unstable to dialysis hemodynamically. Difficult IV access. Have asked critical care team to evaluate.  2. Atrial flutter - w/ bradycardia on dopamine gtt as above.  3. NSTEMI/CAD: s/p LHC with DES placement to proximal LAD 10/16. Had 100% occlusion of 2 smaller branches as well - no intervention options. On DAPT + warfarin.  4. ESRD/ hx failed renal Tx 2009 - long-term esrd pt. Dialyzed here Sunday 10/17 w/ 3L off. At dry wt. Due for HD today. Not stable enough for iHD. As above.  5. HyPOTN/volume: Chronic hypotension, worse here. As above.  6. Anemia: Hgb 11.3 - no ESA needed for now. 7. Secondary hyperparathyroidism: Ca ok, Phos pending. Continue Auryxia as binder. 8. T2DM: Insulin per primary.  Kelly Splinter, MD 07/30/2020, 11:13 AM

## 2020-07-30 NOTE — Procedures (Signed)
Arterial Catheter Insertion Procedure Note  Jerimey Burridge  [id number]  1966-07-13  Date:07/30/20  Time:6:11 PM    Provider Performing: Otilio Carpen Dierdra Salameh    Procedure: Insertion of Arterial Line [phone]) with US guidance ([id number])   Indication(s) Blood pressure monitoring and/or need for frequent ABGs  Consent Risks of the procedure as well as the alternatives and risks of each were explained to the patient and/or caregiver.  Consent for the procedure was obtained and is signed in the bedside chart  Anesthesia 1% lidocaine without epi   Time Out Verified patient identification, verified procedure, site/side was marked, verified correct patient position, special equipment/implants available, medications/allergies/relevant history reviewed, required imaging and test results available.   Sterile Technique Maximal sterile technique including full sterile barrier drape, hand hygiene, sterile gown, sterile gloves, mask, hair covering, sterile ultrasound probe cover (if used).   Procedure Description Area of catheter insertion was cleaned with chlorhexidine and draped in sterile fashion. With real-time ultrasound guidance an arterial catheter was placed into the left radial artery.  Appropriate arterial tracings confirmed on monitor.     Complications/Tolerance None; patient tolerated the procedure well.   EBL Minimal   Specimen(s) None   Otilio Carpen Damesha Lawler, PA-C

## 2020-07-30 NOTE — Consult Note (Addendum)
NAMEDorwin [name], MRN:  [id number], DOB:  [birth year], LOS: 3 ADMISSION DATE:  07/27/2020, CONSULTATION DATE:  07/30/20 REFERRING MD:  Jonnie Finner, CHIEF COMPLAINT:  shock   Brief History   54 y.o. M with PMH ESRD admitted with chest pain and NSTEMI, developed nausea, vomiting, hypotension and bradycardia and was placed on Dopamine and transferred to ICU.   History of present illness   [name] [name] is a 54 y.o.M with PMH significant for ESRD, heart failure, DM who presented on 10/16 with chest pain.  He was found to have A-fib and NSTEMI and taken for cath which showed severe proximal to mid-LAD stenosis s/p DESx1 and EF of 35-40%.   He developed hypotension and Bradycardia on 10/18 thought to be multi-factorial including autonomic dysfunction, volume depletion and vascular issues making measurment difficult.  He was initially given 500cc IVF and then started on Dopamine.  This had to maximized, so patient was transferred to the ICU and PCCM consulted.    Pt is awake and alert and states that he has been nauseated with vomiting for the last day.  He denies abdominal pain, but later c/o epigastric discomfort.   No chest pain, hypoxia or dyspnea.  He feels constipated and last BM was four days ago.   Past Medical History   has a past medical history of Allergic rhinitis, Allergy, Anemia, Aortic insufficiency, Bilateral carpal tunnel syndrome, Cataract, CHF (congestive heart failure) ([place]), Claustrophobia, COPD (chronic obstructive pulmonary disease) ([place]), Diabetes mellitus, Diabetic peripheral neuropathy ([place]), Diabetic retinopathy ([place]), Discitis of lumbar region, DVT (deep venous thrombosis) ([place]) (10/07/2015), Endocarditis (11/28/14), ESRD (end stage renal disease) on dialysis [facility]), Gait disorder, Gastroparesis, GERD (gastroesophageal reflux disease), Gout, Hearing difficulty, Hemodialysis patient ([place]), Hyperlipidemia, Hypertension, Incomplete bladder emptying, Leg pain (02/05/11), Morbid obesity  ([place]), Obstructive sleep apnea, Osteomyelitis ([place]), Posttraumatic stress disorder, Rotator cuff disorder, Secondary hyperparathyroidism ([place]), Sepsis ([place]), Sleep apnea, Urethral stricture, and Vitamin D deficiency.   Significant Hospital Events   10/16 Admit to cardiology 10/18 Hypotension and ICU txfr 10/19 PCCM consult  Consults:  Nephrology PCCM  Procedures:    Significant Diagnostic Tests:  10/16 Cardiac Cath>>Severe proximal to mid LAD stenosis 10/19 Echo EF 40-45%, mild LVH  Micro Data:  10/16 Covid-19 and flu>>negative   Antimicrobials:    Interim history/subjective:  Pt hypotensive and bradycardic on Dopamine, given atropine x1 attempted to place central line, but patient started vomiting.   MAP improved >65 and HR improved when transitioned to peripheral  Levophed.    Objective   Blood pressure (!) 92/38, pulse (!) 56, temperature 98.5 F (36.9 C), temperature source Oral, resp. rate 17, height 5\' 10"  (1.778 m), weight 130.1 kg, SpO2 (!) 88 %.        Intake/Output Summary (Last 24 hours) at 07/30/2020 1137 Last data filed at 07/30/2020 0700 Gross per 24 hour  Intake 322.14 ml  Output --  Net 322.14 ml   Filed Weights   07/27/20 1150 07/28/20 1220 07/28/20 1630  Weight: 129 kg 134.3 kg 130.1 kg    General:  Overweight M, sitting up, conversational and in no distress HEENT: MM pink/moist Neuro: awake and alert, oriented x3 and following commands CV: s1s2 bradycardic, no m/r/g PULM:  CTAB, no signs of respiratory distress GI: obese, soft, mild epigastric discomfort, minimal bowel sounds Extremities: warm/dry, no edema  Skin: no rashes or lesions  Resolved Hospital Problem list     Assessment & Plan:   Critically ill secondary to shock Requiring ICU transfer  and vasopressors.  Source of his shock is not entirely clear, possibly mild cardiogenic shock in the setting of NSTEMI.  He has been afebrile and WBC is down-trending, clinically low suspicion  for distributive or obstructive shock. P: -Lactic acid 3.6, down-trended to 2.6 -Troponin also improving -Hold placement of CVC for now, continue Levophed to maintain MAP >65 -check AM cortisol -Defer blood cultures and antibiotics for now, if becomes febrile would culture and empirically treat -check CXR, prior on 10/16 with concern for L base infiltrate    Nausea, vomiting, abdominal pain Possibly worsened by Dopamine, pt c/o constipation and may be developing ileus.  Lipase normal.  Pt also has history of gallstones on prior CT P: -KUB -check hepatic function and RUQ [place] -avoid anti-emetics in the setting of severely prolonged Qtc    ESRD -Nephrology following, too unstable for HD this morning. -Does not appear volume overloaded at this time, plan per nephrology   NSTEMI, HFrEF -Plan per cardiology, trop improving  Best practice:  Diet: as tolerated Pain/Anxiety/Delirium protocol (if indicated): n/a VAP protocol (if indicated): n/a DVT prophylaxis: warfarin GI prophylaxis: Protonix Glucose control: SSI, lantus Mobility: bed rest Code Status: full code Family Communication: per primary  Disposition: ICU  Labs   CBC: Recent Labs  Lab 07/27/20 1122 07/28/20 0121 07/30/20 0504  WBC 8.4 8.4 17.2*  HGB 12.0* 11.3* 13.1  HCT 39.9 36.1* 43.3  MCV 98.5 95.0 96.4  PLT 247 240 867    Basic Metabolic Panel: Recent Labs  Lab 07/27/20 1122 07/28/20 0121 07/28/20 1126 07/30/20 0504  NA 136 132*  --  138  K 4.3 5.1  --  5.0  CL 91* 92*  --  97*  CO2 30 26  --  23  GLUCOSE 148* 330*  --  96  BUN 49* 57*  --  62*  CREATININE 9.20* 10.21*  --  9.11*  CALCIUM 8.8* 8.4*  --  9.0  MG  --   --  2.4 2.3  PHOS  --   --   --  5.7*   GFR: Estimated Creatinine Clearance: 12.3 mL/min (A) (by C-G formula based on SCr of 9.11 mg/dL (H)). Recent Labs  Lab 07/27/20 1122 07/28/20 0121 07/30/20 0504  WBC 8.4 8.4 17.2*    Liver Function Tests: No results for input(s):  AST, ALT, ALKPHOS, BILITOT, PROT, ALBUMIN in the last 168 hours. No results for input(s): LIPASE, AMYLASE in the last 168 hours. No results for input(s): AMMONIA in the last 168 hours.  ABG    Component Value Date/Time   PHART 7.301 (L) 10/14/2015 1821   PCO2ART 55.1 (H) 10/14/2015 1821   PO2ART 100.0 10/14/2015 1821   HCO3 27.2 (H) 10/14/2015 1821   TCO2 27 06/02/2019 0905   ACIDBASEDEF 4.6 (H) 10/13/2015 1420   O2SAT 97.0 10/14/2015 1821     Coagulation Profile: Recent Labs  Lab 07/27/20 1320 07/30/20 0504  INR 1.8* 1.4*    Cardiac Enzymes: No results for input(s): CKTOTAL, CKMB, CKMBINDEX, TROPONINI in the last 168 hours.  HbA1C: Hgb A1c MFr Bld  Date/Time Value Ref Range Status  07/28/2020 01:21 AM 6.8 (H) 4.8 - 5.6 % Final    Comment:    (NOTE)         Prediabetes: 5.7 - 6.4         Diabetes: >6.4         Glycemic control for adults with diabetes: <7.0   05/13/2020 11:01 AM 7.5 (H) 4.6 - 6.5 %  Final    Comment:    Glycemic Control Guidelines for People with Diabetes:Non Diabetic:  <6%Goal of Therapy: <7%Additional Action Suggested:  >8%     CBG: Recent Labs  Lab 07/29/20 1057 07/29/20 1643 07/29/20 2115 07/29/20 2219 07/30/20 0848  GLUCAP 105* 292* 183* 172* 82    Review of Systems:   Negative except as noted in HPI  Past Medical History  He,  has a past medical history of Allergic rhinitis, Allergy, Anemia, Aortic insufficiency, Bilateral carpal tunnel syndrome, Cataract, CHF (congestive heart failure) ([place]), Claustrophobia, COPD (chronic obstructive pulmonary disease) ([place]), Diabetes mellitus, Diabetic peripheral neuropathy ([place]), Diabetic retinopathy ([place]), Discitis of lumbar region, DVT (deep venous thrombosis) ([place]) (10/07/2015), Endocarditis (11/28/14), ESRD (end stage renal disease) on dialysis [facility]), Gait disorder, Gastroparesis, GERD (gastroesophageal reflux disease), Gout, Hearing difficulty, Hemodialysis patient ([place]), Hyperlipidemia,  Hypertension, Incomplete bladder emptying, Leg pain (02/05/11), Morbid obesity ([place]), Obstructive sleep apnea, Osteomyelitis ([place]), Posttraumatic stress disorder, Rotator cuff disorder, Secondary hyperparathyroidism ([place]), Sepsis ([place]), Sleep apnea, Urethral stricture, and Vitamin D deficiency.   Surgical History    Past Surgical History:  Procedure Laterality Date  . ARTERIOVENOUS GRAFT PLACEMENT Bilateral "several"  . AV FISTULA PLACEMENT Bilateral 2015  . AV FISTULA PLACEMENT Right 06/13/2019   Procedure: INSERTION OF ARTERIOVENOUS (AV) GORE-TEX GRAFT RIGHT  ARM;  Surgeon: Elam Dutch, MD;  Location: [place];  Service: Vascular;  Laterality: Right;  . BASCILIC VEIN TRANSPOSITION Right 09/27/2015  . [place] Right 09/27/2015   Procedure: BASILIC VEIN TRANSPOSITION right;  Surgeon: Mal Misty, MD;  Location: [place];  Service: Vascular;  Laterality: Right;  . CATARACT EXTRACTION W/ INTRAOCULAR LENS  IMPLANT, BILATERAL Bilateral   . CORONARY STENT INTERVENTION N/A 07/27/2020   Procedure: CORONARY STENT INTERVENTION;  Surgeon: Burnell Blanks, MD;  Location: [place] [facility];  Service: Cardiovascular;  Laterality: N/A;  . CYSTOSCOPY W/ INTERNAL URETHROTOMY  09/2014  . IR THROMBECTOMY AV FISTULA W/THROMBOLYSIS/PTA INC/SHUNT/IMG RIGHT Right 08/01/2019  . IR US GUIDE VASC ACCESS RIGHT  08/01/2019  . KIDNEY TRANSPLANT  2009  . LEFT HEART CATH AND CORONARY ANGIOGRAPHY N/A 07/27/2020   Procedure: LEFT HEART CATH AND CORONARY ANGIOGRAPHY;  Surgeon: Burnell Blanks, MD;  Location: [place] [facility];  Service: Cardiovascular;  Laterality: N/A;  . SIGMOIDOSCOPY    . UPPER EXTREMITY VENOGRAPHY Bilateral 06/02/2019   Procedure: UPPER EXTREMITY VENOGRAPHY;  Surgeon: Elam Dutch, MD;  Location: [place] [facility];  Service: Cardiovascular;  Laterality: Bilateral;  . UPPER GASTROINTESTINAL ENDOSCOPY       Social History   reports that he has never smoked. He  has never used smokeless tobacco. He reports that he does not drink alcohol and does not use drugs.   Family History   His family history includes Diabetes in his brother, father, and mother; Hypertension in his mother. There is no history of Colon cancer, Colon polyps, Esophageal cancer, Rectal cancer, or Stomach cancer.   Allergies Allergies  Allergen Reactions  . Pork-Derived Products Anaphylaxis and Other (See Comments)    Fever also   . Ace Inhibitors Cough  . Hydrocodone Other (See Comments)    Hallucinations  . Pork Allergy     Other reaction(s): Unknown     Home Medications  Prior to Admission medications   Medication Sig Start Date End Date Taking? Authorizing Provider  Accu-Chek FastClix Lancets MISC USE TO CHECK BLOOD SUGARS 4 TIMES A DAY. Patient taking differently: 1 each by Other route  See admin instructions. USE TO CHECK BLOOD SUGARS 4 TIMES A DAY. 10/09/19  Yes Elayne Snare, MD  allopurinol (ZYLOPRIM) 100 MG tablet Take 1 tablet (100 mg total) by mouth 3 (three) times a week. After dialysis 05/17/19  Yes Hoyt Koch, MD  AURYXIA 1 GM 210 MG(Fe) tablet Take 210 mg by mouth 3 (three) times daily with meals.  11/08/19  Yes [provider]  calcium acetate (PHOSLO) 667 MG capsule Take 3 capsules (2,001 mg total) by mouth 3 (three) times daily with meals. 10/26/15  Yes Ghimire, Henreitta Leber, MD  clonazePAM (KLONOPIN) 0.5 MG tablet Take 1 tablet (0.5 mg total) by mouth 2 (two) times daily as needed for anxiety. 07/22/20  Yes Biagio Borg, MD  DROPLET PEN NEEDLES 32G X 4 MM MISC USE  TO INJECT FIVE TIMES DAILY AS DIRECTED Patient taking differently: 1 each by Other route See admin instructions. Use to inject 5 times daily as directed. 08/08/19  Yes Elayne Snare, MD  glucose blood (ACCU-CHEK GUIDE) test strip 1 each by Other route as needed for other. Use as instructed to check blood sugar 3 times daily. DX:E11.65 03/08/20  Yes Elayne Snare, MD  hydrocortisone 1 %  ointment Apply 1 application topically 2 (two) times daily. 10/16/19  Yes Levin Erp, PA  hydrocortisone-pramoxine [facility]) rectal foam Place 1 applicator rectally 2 (two) times daily. 10/16/19  Yes Gatha Mayer, MD  Insulin Pen Needle 32G X 8 MM MISC Use with insulin x5 times a day Patient taking differently: 1 each by Other route See admin instructions. Use with insulin x5 times a day 08/16/19  Yes Elayne Snare, MD  insulin regular human CONCENTRATED (HUMULIN R U-500 KWIKPEN) 500 UNIT/ML kwikpen INJECT 50 UNITS UNDER THE SKIN AT BREAKFAST, 70 UNITS AT LUNCH, AND 60 UNITS AT DINNER. Patient taking differently: Inject 50-70 Units into the skin See admin instructions. INJECT 50 UNITS UNDER THE SKIN AT BREAKFAST, 70 UNITS AT LUNCH, AND 60 UNITS AT DINNER. 07/09/20  Yes Elayne Snare, MD  omeprazole (PRILOSEC) 20 MG capsule Take 1 capsule (20 mg total) by mouth daily. Annual appt due in Oct must see provider for future refills 03/01/20  Yes Hoyt Koch, MD  RENVELA 800 MG tablet Take 2,400 mg by mouth 3 (three) times daily with meals.  02/02/17  Yes [provider]  rosuvastatin (CRESTOR) 20 MG tablet Take 1 tablet (20 mg total) by mouth at bedtime. Annual appt due in Oct must see provider for future refills 03/01/20  Yes Hoyt Koch, MD  TOUJEO MAX SOLOSTAR 300 UNIT/ML Solostar Pen INJECT 80 UNITS UNDER THE SKIN IN THE MORNING AND 80 UNITS IN THE EVENING Patient taking differently: Inject 80 Units into the skin See admin instructions. INJECT 80 UNITS UNDER THE SKIN IN THE MORNING AND 80 UNITS IN THE EVENING 06/11/20  Yes Elayne Snare, MD  VELTASSA 8.4 g packet Take 8.4 g by mouth daily.  12/30/16  Yes [provider]  warfarin (COUMADIN) 5 MG tablet TAKE 1 TABLET EVERY DAY OR AS DIRECTED BY COUMADIN CLINIC. Patient taking differently: Take 5 mg by mouth See admin instructions. TAKE 1 TABLET EVERY DAY OR AS DIRECTED BY COUMADIN CLINIC. 06/11/20  Yes Hoyt Koch, MD  busPIRone (BUSPAR) 5 MG tablet TAKE 1 TABLET (5 MG TOTAL) BY MOUTH 2 (TWO) TIMES DAILY AS NEEDED. Patient not taking: Reported on 07/27/2020 06/06/20   Hoyt Koch, MD  loperamide (IMODIUM) 2 MG capsule Take  1 capsule (2 mg total) by mouth as needed for diarrhea or loose stools. Patient not taking: Reported on 07/27/2020 10/20/19   Levin Erp, PA  mupirocin ointment (BACTROBAN) 2 % Apply 1 application topically daily. Apply to dorsal foot wound daily Patient not taking: Reported on 07/27/2020 05/29/20   Criselda Peaches, DPM  sodium zirconium cyclosilicate (LOKELMA) 10 g PACK packet Take 10 g by mouth daily.  12/26/19   [provider]     Critical care time: 50 minutes       CRITICAL CARE Performed by: Otilio Carpen Olivier Frayre   Total critical care time: 50 minutes  Critical care time was exclusive of separately billable procedures and treating other patients.  Critical care was necessary to treat or prevent imminent or life-threatening deterioration.  Critical care was time spent personally by me on the following activities: development of treatment plan with patient and/or surrogate as well as nursing, discussions with consultants, evaluation of patient's response to treatment, examination of patient, obtaining history from patient or surrogate, ordering and performing treatments and interventions, ordering and review of laboratory studies, ordering and review of radiographic studies, pulse oximetry and re-evaluation of patient's condition.  Otilio Carpen Korin Hartwell, PA-C [place] [facility]  Pager# [phone], if no answer [phone]

## 2020-07-30 NOTE — Consult Note (Addendum)
ELECTROPHYSIOLOGY CONSULT NOTE    Patient ID: [name] [name] MRN: [id number], DOB/AGE: May 23, 1966 54 y.o.  Admit date: 07/27/2020 Date of Consult: 07/30/2020  Primary Physician: Hoyt Koch, MD Primary Cardiologist: Lauree Chandler, MD  Electrophysiologist: new  Referring Provider: Dr. Tamala Julian  Patient Profile: [name] [name] is a 54 y.o. male with a history of ESRD on HD, COPD, AI, RBBB, OSA, DMII, persistent atrial flutter, chronic OAC on coumadin, chronic DVT, chronic diastolic CHF, h/o endocarditis in 2016, who is being seen today for the evaluation of bradycardia at the request of Dr. Tamala Julian.  HPI:  [name] [name] is a 54 y.o. male with medical history above. Pt presented with chest pain and found to have NSTEMI with "severe (99%) proximal to mid occlusion of LAD" that was able to be treated with a stent.   He was seen in office 10/13 and scheduled for stress test, but presented as above with chest pain and NSTEMI.   Pt noted to be in atypical AFL on arrival and at previous follow up visits. Therapy has been limited due to hypotension, which has worsened since HD 10/17.  Overnight 10/19 rapid response called for bradycardia into the 40s with hypotension of systolic in the 08X with chest discomfort. Pt had received a dose of klonopin several minutes prior due to anxiety. Pt given atropine without change in HR. Started on dopamine. HR gradually increased into 50s and SBP into 90s. Transferred to ICU.   Currently sitting upright in bed with nausea. Systolic BP in 44Y on 10 of dopamine. HR in upper 50s. Pt denies any history of syncope. Chronic HRs by synopsis range from 50-90s, with higher HRs at times. He gets dialysis on the right, after a failed/removed fistula on the left. He remembers his "heart infection" years ago that got better on ABX. He denies chest pain currently. Just "doesn't feel good."   LHC 07/27/2020  Lat Ramus lesion is 100% stenosed.  1st Mrg  lesion is 100% stenosed.  Prox LAD lesion is 99% stenosed.  A drug-eluting stent was successfully placed using a STENT RESOLUTE ONYX 3.5X18.  Post intervention, there is a 0% residual stenosis.   1. Severe proximal to mid LAD stenosis 2. Chronic occlusion of the distal intermediate branch, small in caliber 3. Complete occlusion of the distal segment of the first obtuse marginal branch. Cannot exclude that this was the infarct vessel but the patient was pain free and this vessel did not appear large enough for PCI. There is collateral filling of lateral wall branches from the RCA 4. Successful PTCA/DES x 1 proximal to mid LAD   Past Medical History:  Diagnosis Date  . Allergic rhinitis   . Allergy   . Anemia   . Aortic insufficiency    a. Echo 7/16:  Mod LVH, EF 50-55%, mild to mod AI, severe LAE, PASP 57 mmHg (LV ID end diastolic 18.5 mm);  b. Echo 2/17:  Mod LVH, EF 50-55%, mod AI, MAC, mild MR, mod LAE, mild to mod TR, PASP 63 mmHg  . Bilateral carpal tunnel syndrome   . Cataract    removed both eyes   . CHF (congestive heart failure) ([place])   . Claustrophobia   . COPD (chronic obstructive pulmonary disease) ([place])   . Diabetes mellitus    Type II  . Diabetic peripheral neuropathy ([place])   . Diabetic retinopathy ([place])   . Discitis of lumbar region    resolved  . DVT (deep venous thrombosis) ([place]) 10/07/2015  LLE  . Endocarditis 11/28/14  . ESRD (end stage renal disease) on dialysis [facility])    s/p renal transplant in 2009, failed in 2016  . Gait disorder   . Gastroparesis   . GERD (gastroesophageal reflux disease)   . Gout   . Hearing difficulty   . Hemodialysis patient ([place])    tuesday, thursday, saturday   . Hyperlipidemia   . Hypertension   . Incomplete bladder emptying   . Leg pain 02/05/11   with walking  . Morbid obesity ([place])   . Obstructive sleep apnea    not using CPAP- lost 100lbs  . Osteomyelitis ([place])   . Posttraumatic stress disorder   . Rotator cuff  disorder   . Secondary hyperparathyroidism ([place])   . Sepsis ([place])    Postive blood cultures -   . Sleep apnea    no cpap  . Urethral stricture   . Vitamin D deficiency      Surgical History:  Past Surgical History:  Procedure Laterality Date  . ARTERIOVENOUS GRAFT PLACEMENT Bilateral "several"  . AV FISTULA PLACEMENT Bilateral 2015  . AV FISTULA PLACEMENT Right 06/13/2019   Procedure: INSERTION OF ARTERIOVENOUS (AV) GORE-TEX GRAFT RIGHT  ARM;  Surgeon: Elam Dutch, MD;  Location: [place];  Service: Vascular;  Laterality: Right;  . BASCILIC VEIN TRANSPOSITION Right 09/27/2015  . [place] Right 09/27/2015   Procedure: BASILIC VEIN TRANSPOSITION right;  Surgeon: Mal Misty, MD;  Location: [place];  Service: Vascular;  Laterality: Right;  . CATARACT EXTRACTION W/ INTRAOCULAR LENS  IMPLANT, BILATERAL Bilateral   . CORONARY STENT INTERVENTION N/A 07/27/2020   Procedure: CORONARY STENT INTERVENTION;  Surgeon: Burnell Blanks, MD;  Location: [place] [facility];  Service: Cardiovascular;  Laterality: N/A;  . CYSTOSCOPY W/ INTERNAL URETHROTOMY  09/2014  . IR THROMBECTOMY AV FISTULA W/THROMBOLYSIS/PTA INC/SHUNT/IMG RIGHT Right 08/01/2019  . IR US GUIDE VASC ACCESS RIGHT  08/01/2019  . KIDNEY TRANSPLANT  2009  . LEFT HEART CATH AND CORONARY ANGIOGRAPHY N/A 07/27/2020   Procedure: LEFT HEART CATH AND CORONARY ANGIOGRAPHY;  Surgeon: Burnell Blanks, MD;  Location: [place] [facility];  Service: Cardiovascular;  Laterality: N/A;  . SIGMOIDOSCOPY    . UPPER EXTREMITY VENOGRAPHY Bilateral 06/02/2019   Procedure: UPPER EXTREMITY VENOGRAPHY;  Surgeon: Elam Dutch, MD;  Location: [place];  Service: Cardiovascular;  Laterality: Bilateral;  . UPPER GASTROINTESTINAL ENDOSCOPY       Medications Prior to Admission  Medication Sig Dispense Refill Last Dose  . Accu-Chek FastClix Lancets MISC USE TO CHECK BLOOD SUGARS 4 TIMES A DAY. (Patient taking  differently: 1 each by Other route See admin instructions. USE TO CHECK BLOOD SUGARS 4 TIMES A DAY.) 360 each 4 07/27/2020 at Unknown time  . allopurinol (ZYLOPRIM) 100 MG tablet Take 1 tablet (100 mg total) by mouth 3 (three) times a week. After dialysis 30 tablet 6 Past Week at Unknown time  . AURYXIA 1 GM 210 MG(Fe) tablet Take 210 mg by mouth 3 (three) times daily with meals.    07/26/2020 at Unknown time  . calcium acetate (PHOSLO) 667 MG capsule Take 3 capsules (2,001 mg total) by mouth 3 (three) times daily with meals. 270 capsule 0 07/26/2020 at Unknown time  . clonazePAM (KLONOPIN) 0.5 MG tablet Take 1 tablet (0.5 mg total) by mouth 2 (two) times daily as needed for anxiety. 60 tablet 0 07/26/2020 at Unknown time  . DROPLET PEN NEEDLES 32G X 4  MM MISC USE  TO INJECT FIVE TIMES DAILY AS DIRECTED (Patient taking differently: 1 each by Other route See admin instructions. Use to inject 5 times daily as directed.) 500 each 1 07/26/2020 at Unknown time  . glucose blood (ACCU-CHEK GUIDE) test strip 1 each by Other route as needed for other. Use as instructed to check blood sugar 3 times daily. DX:E11.65 300 each 1 07/27/2020 at Unknown time  . hydrocortisone 1 % ointment Apply 1 application topically 2 (two) times daily. 30 g 0 Past Week at Unknown time  . hydrocortisone-pramoxine (PROCTOFOAM-HC) rectal foam Place 1 applicator rectally 2 (two) times daily. 30 g 0 Past Week at Unknown time  . Insulin Pen Needle 32G X 8 MM MISC Use with insulin x5 times a day (Patient taking differently: 1 each by Other route See admin instructions. Use with insulin x5 times a day) 500 each 1 07/26/2020 at Unknown time  . insulin regular human CONCENTRATED (HUMULIN R U-500 KWIKPEN) 500 UNIT/ML kwikpen INJECT 50 UNITS UNDER THE SKIN AT BREAKFAST, 70 UNITS AT LUNCH, AND 60 UNITS AT DINNER. (Patient taking differently: Inject 50-70 Units into the skin See admin instructions. INJECT 50 UNITS UNDER THE SKIN AT BREAKFAST, 70  UNITS AT LUNCH, AND 60 UNITS AT DINNER.) 12 mL 0 07/26/2020 at Unknown time  . omeprazole (PRILOSEC) 20 MG capsule Take 1 capsule (20 mg total) by mouth daily. Annual appt due in Oct must see provider for future refills 90 capsule 1 07/26/2020 at Unknown time  . RENVELA 800 MG tablet Take 2,400 mg by mouth 3 (three) times daily with meals.    07/26/2020 at Unknown time  . rosuvastatin (CRESTOR) 20 MG tablet Take 1 tablet (20 mg total) by mouth at bedtime. Annual appt due in Oct must see provider for future refills 90 tablet 1 07/26/2020 at Unknown time  . TOUJEO MAX SOLOSTAR 300 UNIT/ML Solostar Pen INJECT 80 UNITS UNDER THE SKIN IN THE MORNING AND 80 UNITS IN THE EVENING (Patient taking differently: Inject 80 Units into the skin See admin instructions. INJECT 80 UNITS UNDER THE SKIN IN THE MORNING AND 80 UNITS IN THE EVENING) 48 mL 1 07/26/2020 at Unknown time  . VELTASSA 8.4 g packet Take 8.4 g by mouth daily.    07/26/2020 at Unknown time  . warfarin (COUMADIN) 5 MG tablet TAKE 1 TABLET EVERY DAY OR AS DIRECTED BY COUMADIN CLINIC. (Patient taking differently: Take 5 mg by mouth See admin instructions. TAKE 1 TABLET EVERY DAY OR AS DIRECTED BY COUMADIN CLINIC.) 100 tablet 0 07/26/2020 at Unknown time  . busPIRone (BUSPAR) 5 MG tablet TAKE 1 TABLET (5 MG TOTAL) BY MOUTH 2 (TWO) TIMES DAILY AS NEEDED. (Patient not taking: Reported on 07/27/2020) 180 tablet 1 Not Taking at Unknown time  . loperamide (IMODIUM) 2 MG capsule Take 1 capsule (2 mg total) by mouth as needed for diarrhea or loose stools. (Patient not taking: Reported on 07/27/2020) 30 capsule 0 Not Taking at Unknown time  . mupirocin ointment (BACTROBAN) 2 % Apply 1 application topically daily. Apply to dorsal foot wound daily (Patient not taking: Reported on 07/27/2020) 30 g 2 Not Taking at Unknown time  . sodium zirconium cyclosilicate (LOKELMA) 10 g PACK packet Take 10 g by mouth daily.    unk    Inpatient Medications:  . aspirin  81 mg  Oral Daily  . atorvastatin  80 mg Oral Daily  . Chlorhexidine Gluconate Cloth  6 each Topical Daily  . clopidogrel  75 mg Oral Q breakfast  . ferric citrate  210 mg Oral TID WC  . insulin aspart  0-15 Units Subcutaneous TID WC  . insulin aspart  0-5 Units Subcutaneous QHS  . insulin glargine  40 Units Subcutaneous BID  . insulin regular human CONCENTRATED  40 Units Subcutaneous TID WC  . metoCLOPramide (REGLAN) injection  5 mg Intravenous Once  . ondansetron      . pantoprazole  40 mg Oral Daily  . patiromer  8.4 g Oral Daily  . sevelamer carbonate  2,400 mg Oral TID WC  . sodium chloride flush  3 mL Intravenous Q12H  . Warfarin - Pharmacist Dosing Inpatient   Does not apply q1600    Allergies:  Allergies  Allergen Reactions  . Pork-Derived Products Anaphylaxis and Other (See Comments)    Fever also   . Ace Inhibitors Cough  . Hydrocodone Other (See Comments)    Hallucinations  . Pork Allergy     Other reaction(s): Unknown    Social History   Socioeconomic History  . Marital status: Divorced    Spouse name: Not on file  . Number of children: 0  . Years of education: College   . Highest education level: Bachelor's degree (e.g., BA, AB, BS)  Occupational History    Employer: UNEMPLOYED  Tobacco Use  . Smoking status: Never Smoker  . Smokeless tobacco: Never Used  Vaping Use  . Vaping Use: Never used  Substance and Sexual Activity  . Alcohol use: No    Alcohol/week: 0.0 standard drinks  . Drug use: No  . Sexual activity: Not Currently  Other Topics Concern  . Not on file  Social History Narrative   Patient is Divorced.   Patient does not have any children.   Patient is right-handed   Patient is currently in college working on his Ph.D.      Social Determinants of Health   Financial Resource Strain: Low Risk   . Difficulty of Paying Living Expenses: Not very hard  Food Insecurity: No Food Insecurity  . Worried About Charity fundraiser in the Last Year:  Never true  . Ran Out of Food in the Last Year: Never true  Transportation Needs: No Transportation Needs  . Lack of Transportation (Medical): No  . Lack of Transportation (Non-Medical): No  Physical Activity: Inactive  . Days of Exercise per Week: 0 days  . Minutes of Exercise per Session: 0 min  Stress: Stress Concern Present  . Feeling of Stress : Very much  Social Connections: Moderately Isolated  . Frequency of Communication with Friends and Family: More than three times a week  . Frequency of Social Gatherings with Friends and Family: More than three times a week  . Attends Religious Services: 1 to 4 times per year  . Active Member of Clubs or Organizations: No  . Attends Archivist Meetings: Never  . Marital Status: Divorced  Human resources officer Violence: Not At Risk  . Fear of Current or Ex-Partner: No  . Emotionally Abused: No  . Physically Abused: No  . Sexually Abused: No     Family History  Problem Relation Age of Onset  . Hypertension Mother   . Diabetes Mother   . Diabetes Father   . Diabetes Brother   . Colon cancer Neg Hx   . Colon polyps Neg Hx   . Esophageal cancer Neg Hx   . Rectal cancer Neg Hx   . Stomach cancer Neg Hx  Review of Systems: All other systems reviewed and are otherwise negative except as noted above.  Physical Exam: Vitals:   07/30/20 0630 07/30/20 0645 07/30/20 0700 07/30/20 0715  BP: (!) _0 (!) 92/38  Pulse: (!) 51 (!) 53 (!) 52 (!) 56  Resp: _1 Temp:      TempSrc:      SpO2: 94% 95% 93% (!) 88%  Weight:      Height:        GEN- The patient is well appearing, alert and oriented x 3 today.   HEENT: normocephalic, atraumatic; sclera clear, conjunctiva pink; hearing intact; oropharynx clear; neck supple Lungs- Clear to ausculation bilaterally, normal work of breathing.  No wheezes, rales, rhonchi Heart- Regular rate and rhythm, no murmurs, rubs or gallops GI- soft, non-tender, non-distended,  bowel sounds present Extremities- no clubbing, cyanosis, or edema; DP/PT/radial pulses 2+ bilaterally MS- no significant deformity or atrophy Skin- warm and dry, no rash or lesion Psych- euthymic mood, full affect Neuro- strength and sensation are intact  Labs:   Lab Results  Component Value Date   WBC 17.2 (H) 07/30/2020   HGB 13.1 07/30/2020   HCT 43.3 07/30/2020   MCV 96.4 07/30/2020   PLT 382 07/30/2020    Recent Labs  Lab 07/30/20 0504  NA 138  K 5.0  CL 97*  CO2 23  BUN 62*  CREATININE 9.11*  CALCIUM 9.0  GLUCOSE 96      Radiology/Studies: CARDIAC CATHETERIZATION  Addendum Date: 07/27/2020    Lat Ramus lesion is 100% stenosed.  1st Mrg lesion is 100% stenosed.  Prox LAD lesion is 99% stenosed.  A drug-eluting stent was successfully placed using a STENT RESOLUTE ONYX 3.5X18.  Post intervention, there is a 0% residual stenosis.  1. Severe proximal to mid LAD stenosis 2. Chronic occlusion of the distal intermediate branch, small in caliber 3. Complete occlusion of the distal segment of the first obtuse marginal branch. Cannot exclude that this was the infarct vessel but the patient was pain free and this vessel did not appear large enough for PCI. There is collateral filling of lateral wall branches from the RCA 4. Successful PTCA/DES x 1 proximal to mid LAD Recommendations: Will admit to the ICU. Will continue DAPT with ASA and Plavix. Resume coumadin prior to discharge. I would plan to use triple therapy with ASA, Plavix and coumadin for one month then stop ASA. Continue statin. Will not start a beta blocker given his soft BP. Echo tomorrow.   Result Date: 07/27/2020  Lat Ramus lesion is 100% stenosed.  1st Mrg lesion is 100% stenosed.  Prox LAD lesion is 99% stenosed.  1. Severe proximal to mid LAD stenosis 2. Chronic occlusion of the distal intermediate branch, small in caliber 3. Complete occlusion of the distal segment of the first obtuse marginal branch. Cannot  exclude that this was the infarct vessel but the patient was pain free and this vessel did not appear large enough for PCI. There is collateral filling of lateral wall branches from the RCA 4. Successful PTCA/DES x 1 proximal to mid LAD Recommendations: Will admit to the ICU. Will continue DAPT with ASA and Plavix. Resume coumadin prior to discharge. I would plan to use triple therapy with ASA, Plavix and coumadin for one month then stop ASA. Continue statin. Will not start a beta blocker given his soft BP. Echo tomorrow.   DG Chest Portable 1 View  Result Date: 07/27/2020 CLINICAL DATA:  Chest pain and shortness of breath EXAM: PORTABLE CHEST 1 VIEW COMPARISON:  April 11, 2018 FINDINGS: There is ill-defined airspace opacity in the left base. Lungs are otherwise clear. There is cardiomegaly with pulmonary vascularity within normal limits. No adenopathy. No bone lesions. No pneumothorax IMPRESSION: Opacity left base concerning for potential pneumonia. Lungs otherwise clear. There is cardiac enlargement. No adenopathy appreciable. Electronically Signed   By: Lowella Grip III M.D.   On: 07/27/2020 12:30   ECHOCARDIOGRAM COMPLETE  Result Date: 07/29/2020    ECHOCARDIOGRAM REPORT   Patient Name:   [name] [name] Date of Exam: 07/29/2020 Medical Rec #:  [id number]      Height:       70.0 in Accession #:    [id number]     Weight:       286.8 lb Date of Birth:  [birth year]      BSA:          2.433 m Patient Age:    66 years       BP:           82/55 mmHg Patient Gender: M              HR:           97 bpm. Exam Location:  Inpatient Procedure: 2D Echo Indications:    NSTEMI I21.4  History:        Patient has prior history of Echocardiogram examinations, most                 recent 11/20/2019. CHF, COPD; Risk Factors:Diabetes, Hypertension                 and Dyslipidemia. Endocarditis.  Sonographer:    Jannett Celestine RDCS (AE) Referring Phys: [place] Comments: Patient is morbidly  obese. Image acquisition challenging due to patient body habitus. IMPRESSIONS  1. Extremely limited; probable distal septal and apical akinesis with overall mild to moderate LV dysfunction.  2. Left ventricular ejection fraction, by estimation, is 40 to 45%. The left ventricle has mildly decreased function. The left ventricle demonstrates regional wall motion abnormalities (see scoring diagram/findings for description). There is mild left ventricular hypertrophy. Left ventricular diastolic function could not be evaluated.  3. Right ventricular systolic function is normal. The right ventricular size is normal.  4. The mitral valve is normal in structure. No evidence of mitral valve regurgitation. No evidence of mitral stenosis. Moderate mitral annular calcification.  5. The aortic valve is tricuspid. Aortic valve regurgitation is not visualized. No aortic stenosis is present.  6. The inferior vena cava is dilated in size with <50% respiratory variability, suggesting right atrial pressure of 15 mmHg. FINDINGS  Left Ventricle: Left ventricular ejection fraction, by estimation, is 40 to 45%. The left ventricle has mildly decreased function. The left ventricle demonstrates regional wall motion abnormalities. The left ventricular internal cavity size was normal in size. There is mild left ventricular hypertrophy. Left ventricular diastolic function could not be evaluated due to atrial fibrillation. Left ventricular diastolic function could not be evaluated. Right Ventricle: The right ventricular size is normal.Right ventricular systolic function is normal. Left Atrium: Left atrial size was normal in size. Right Atrium: Right atrial size was normal in size. Pericardium: There is no evidence of pericardial effusion. Mitral Valve: The mitral valve is normal in structure. Moderate mitral annular calcification. No evidence of mitral valve regurgitation. No evidence of mitral valve stenosis. Tricuspid Valve: The tricuspid valve  is normal  in structure. Tricuspid valve regurgitation is not demonstrated. No evidence of tricuspid stenosis. Aortic Valve: The aortic valve is tricuspid. Aortic valve regurgitation is not visualized. No aortic stenosis is present. Pulmonic Valve: The pulmonic valve was not well visualized. Pulmonic valve regurgitation is not visualized. No evidence of pulmonic stenosis. Aorta: The aortic root is normal in size and structure. Venous: The inferior vena cava is dilated in size with less than 50% respiratory variability, suggesting right atrial pressure of 15 mmHg.  Additional Comments: Extremely limited; probable distal septal and apical akinesis with overall mild to moderate LV dysfunction.  LEFT VENTRICLE PLAX 2D LVIDd:         4.60 cm LVIDs:         3.50 cm LV PW:         1.20 cm LV IVS:        1.65 cm LVOT diam:     2.10 cm LV SV:         62 LV SV Index:   26 LVOT Area:     3.46 cm  LEFT ATRIUM             Index LA diam:        5.10 cm 2.10 cm/m LA Vol (A2C):   85.5 ml 35.14 ml/m LA Vol (A4C):   61.0 ml 25.07 ml/m LA Biplane Vol: 74.9 ml 30.79 ml/m  AORTIC VALVE LVOT Vmax:   101.00 cm/s LVOT Vmean:  75.500 cm/s LVOT VTI:    0.180 m  AORTA Ao Root diam: 3.60 cm MITRAL VALVE MV Area (PHT): 4.49 cm     SHUNTS MV Decel Time: 169 msec     Systemic VTI:  0.18 m MV E velocity: 131.00 cm/s  Systemic Diam: 2.10 cm Kirk Ruths MD Electronically signed by Kirk Ruths MD Signature Date/Time: 07/29/2020/12:27:11 PM    Final    TEE 11/2014 at [place]  (OLD ECHO< NOTE DATE) SUMMARY Left ventricular systolic function is normal. Small echodensity (vegetation) seen on the ventricular side of the left cusp of the aortic valve with partial flail and associated with severe eccentric, aortic regurgitation. Very short PHT 196 msc, and jet occupys > 65% of LVOT There is trace mitral regurgitation. There is no vegetation seen on the mitral valve. There is mild tricuspid regurgitation. There is no tricuspid valve  vegetation. Moderate, focal atherosclerotic plaque(s) in the ascending aorta.There is no pericardial effusion.   EKG: 10/18 clear shows atrial flutter with HRs in 44 range (personally reviewed) Review of previous EKGs appears to show persistent atrial flutter as far back as 2017. Several EKGs are intermittently read as NSR, but with same appearance as some of his AFL EKGs.  Last clear sinus appears 09/2015  TELEMETRY: Review from admission shows AFL in 100-110s on arrival. Otherwise conduction has varied with AFL in 50-60s and 100-120s. Several runs of tachycardia noted last night with subsequent 3+ second pauses and bradycardia once these tachy rhythms subsided.(personally reviewed)  Assessment/Plan: 1.  Persistent atypical appearing atrial flutter with slow ventricular rate and AV block  Looking back has has periods of atrial flutter as far back as 2017 Not on AV nodal blockade, and has previously been intolerant due to hypotension.  His Rates are currently OK in 50-60s on dopamine but his pressure remains soft.  It is not clear if a PPM would increase his quality of life or prognosis, and he would have a very high risk of infection.  On coumadin for CHA2DS2VASC of at least  5. No h/o stroke.   Also on coumadin for h/o LLE DVT (09/2015) With atypical appearance, LAE, and co-morbidities, he would not be an ablation candidate.   2. CAD s/p NSTEMI and stenting this admission Cath 10/16 as above with severe disease and s/p LAD stent (previously 99% stenosis) He continues to have intermittent chest pressure with hypotension.   3. ESRD on HD Nephrology following. R sided fistula. Left sided fistula previously failed and removed per patient.   4. Hypotension Has been worse since HD 10/17. HD today will keep even. Afebrile. Not clearly as a result of his bradycardia, as yesterday he had systolic pressures in the 60-70s despite HRs in the upper 50s-60s.  5. Chronic systolic CHF Echo showed  LVEF 40-45%. Left atrial size 5.1 cm. Meds limited by hypotension.  WBC 17.2 yesterday, but 11.2 today.   6. H/o lumbar discitis 2016 -> Enterococcus bacteremia -> endocarditis 2016 Echocardiogram at that time demonstrated AV endocarditis with severe aortic insuff. Treated with IV ABX. Pt "declined" surgery per the notes and didn't appear to follow up with [facility] surgeons.Was seen by [facility] and blood cultutes (02/27/2015 showed no growth) Repeat Echo 04/2015 showed normal LVF and mild aortic insuff. No TEE recommended at that time.  Patient is critically ill. Prognosis is guarded. He would be exteremly high risk for PPM consideration. Dr. Rayann Heman to see. CCM consult placed as well. Recommend goals of care discussion.   For questions or updates, please contact [place] Please consult [email or web address] for contact info under Cardiology/STEMI.  Signed, Shirley Friar, PA-C  07/30/2020 9:54 AM  I have seen, examined the patient, and reviewed the above assessment and plan.  Changes to above are made where necessary.  On exam, chronically ill, iRRR.   The patient has longstanding persistent atrial arrhythmias.  V rates appear to have mostly been stable at home.  He denies prior symptoms of bradycardia.  I am not certain that his current hypotensive events are due to bradycardia.  Given prior infections as well as bilateral AV fistulas, he is a poor candidate for PPM or EP procedures. The patient is also clear that he wishes to avoid pacing at this time.  Electrophysiology team to see as needed while here. Please call with questions.   Co Sign: Thompson Grayer, MD 07/30/2020

## 2020-07-30 NOTE — Progress Notes (Signed)
ANTICOAGULATION CONSULT NOTE - Initial Consult  Pharmacy Consult for warfarin Indication: atrial fibrillation  Allergies  Allergen Reactions  . Pork-Derived Products Anaphylaxis and Other (See Comments)    Fever also   . Ace Inhibitors Cough  . Hydrocodone Other (See Comments)    Hallucinations  . Pork Allergy     Other reaction(s): Unknown    Patient Measurements: Height: _0  (177.8 cm) Weight: 130.1 kg (286 lb 13.1 oz) IBW/kg (Calculated) : 73  Vital Signs: Temp: 98.5 F (36.9 C) (10/19 0345) Temp Source: Oral (10/19 0345) BP: 92/38 (10/19 0715) Pulse Rate: 56 (10/19 0715)  Labs: Recent Labs    07/27/20 1122 07/27/20 1122 07/27/20 1320 07/28/20 0121 07/30/20 0504  HGB 12.0*   < >  --  11.3* 13.1  HCT 39.9  --   --  36.1* 43.3  PLT 247  --   --  240 382  LABPROT  --   --  20.4*  --  16.2*  INR  --   --  1.8*  --  1.4*  CREATININE 9.20*  --   --  10.21* 9.11*  TROPONINIHS >27,000*  --  >27,000*  --   --    < > = values in this interval not displayed.    Estimated Creatinine Clearance: 12.3 mL/min (A) (by C-G formula based on SCr of 9.11 mg/dL (H)).   Medical History: Past Medical History:  Diagnosis Date  . Allergic rhinitis   . Allergy   . Anemia   . Aortic insufficiency    a. Echo 7/16:  Mod LVH, EF 50-55%, mild to mod AI, severe LAE, PASP 57 mmHg (LV ID end diastolic 69.4 mm);  b. Echo 2/17:  Mod LVH, EF 50-55%, mod AI, MAC, mild MR, mod LAE, mild to mod TR, PASP 63 mmHg  . Bilateral carpal tunnel syndrome   . Cataract    removed both eyes   . CHF (congestive heart failure) ([place])   . Claustrophobia   . COPD (chronic obstructive pulmonary disease) ([place])   . Diabetes mellitus    Type II  . Diabetic peripheral neuropathy ([place])   . Diabetic retinopathy ([place])   . Discitis of lumbar region    resolved  . DVT (deep venous thrombosis) ([place]) 10/07/2015   LLE  . Endocarditis 11/28/14  . ESRD (end stage renal disease) on dialysis [facility])    s/p renal  transplant in 2009, failed in 2016  . Gait disorder   . Gastroparesis   . GERD (gastroesophageal reflux disease)   . Gout   . Hearing difficulty   . Hemodialysis patient ([place])    tuesday, thursday, saturday   . Hyperlipidemia   . Hypertension   . Incomplete bladder emptying   . Leg pain 02/05/11   with walking  . Morbid obesity ([place])   . Obstructive sleep apnea    not using CPAP- lost 100lbs  . Osteomyelitis ([place])   . Posttraumatic stress disorder   . Rotator cuff disorder   . Secondary hyperparathyroidism ([place])   . Sepsis ([place])    Postive blood cultures -   . Sleep apnea    no cpap  . Urethral stricture   . Vitamin D deficiency     Medications:  Medications Prior to Admission  Medication Sig Dispense Refill Last Dose  . Accu-Chek FastClix Lancets MISC USE TO CHECK BLOOD SUGARS 4 TIMES A DAY. (Patient taking differently: 1 each by Other route See admin instructions. USE TO CHECK BLOOD SUGARS  4 TIMES A DAY.) 360 each 4 07/27/2020 at Unknown time  . allopurinol (ZYLOPRIM) 100 MG tablet Take 1 tablet (100 mg total) by mouth 3 (three) times a week. After dialysis 30 tablet 6 Past Week at Unknown time  . AURYXIA 1 GM 210 MG(Fe) tablet Take 210 mg by mouth 3 (three) times daily with meals.    07/26/2020 at Unknown time  . calcium acetate (PHOSLO) 667 MG capsule Take 3 capsules (2,001 mg total) by mouth 3 (three) times daily with meals. 270 capsule 0 07/26/2020 at Unknown time  . clonazePAM (KLONOPIN) 0.5 MG tablet Take 1 tablet (0.5 mg total) by mouth 2 (two) times daily as needed for anxiety. 60 tablet 0 07/26/2020 at Unknown time  . DROPLET PEN NEEDLES 32G X 4 MM MISC USE  TO INJECT FIVE TIMES DAILY AS DIRECTED (Patient taking differently: 1 each by Other route See admin instructions. Use to inject 5 times daily as directed.) 500 each 1 07/26/2020 at Unknown time  . glucose blood (ACCU-CHEK GUIDE) test strip 1 each by Other route as needed for other. Use as instructed to check blood  sugar 3 times daily. DX:E11.65 300 each 1 07/27/2020 at Unknown time  . hydrocortisone 1 % ointment Apply 1 application topically 2 (two) times daily. 30 g 0 Past Week at Unknown time  . hydrocortisone-pramoxine (PROCTOFOAM-HC) rectal foam Place 1 applicator rectally 2 (two) times daily. 30 g 0 Past Week at Unknown time  . Insulin Pen Needle 32G X 8 MM MISC Use with insulin x5 times a day (Patient taking differently: 1 each by Other route See admin instructions. Use with insulin x5 times a day) 500 each 1 07/26/2020 at Unknown time  . insulin regular human CONCENTRATED (HUMULIN R U-500 KWIKPEN) 500 UNIT/ML kwikpen INJECT 50 UNITS UNDER THE SKIN AT BREAKFAST, 70 UNITS AT LUNCH, AND 60 UNITS AT DINNER. (Patient taking differently: Inject 50-70 Units into the skin See admin instructions. INJECT 50 UNITS UNDER THE SKIN AT BREAKFAST, 70 UNITS AT LUNCH, AND 60 UNITS AT DINNER.) 12 mL 0 07/26/2020 at Unknown time  . omeprazole (PRILOSEC) 20 MG capsule Take 1 capsule (20 mg total) by mouth daily. Annual appt due in Oct must see provider for future refills 90 capsule 1 07/26/2020 at Unknown time  . RENVELA 800 MG tablet Take 2,400 mg by mouth 3 (three) times daily with meals.    07/26/2020 at Unknown time  . rosuvastatin (CRESTOR) 20 MG tablet Take 1 tablet (20 mg total) by mouth at bedtime. Annual appt due in Oct must see provider for future refills 90 tablet 1 07/26/2020 at Unknown time  . TOUJEO MAX SOLOSTAR 300 UNIT/ML Solostar Pen INJECT 80 UNITS UNDER THE SKIN IN THE MORNING AND 80 UNITS IN THE EVENING (Patient taking differently: Inject 80 Units into the skin See admin instructions. INJECT 80 UNITS UNDER THE SKIN IN THE MORNING AND 80 UNITS IN THE EVENING) 48 mL 1 07/26/2020 at Unknown time  . VELTASSA 8.4 g packet Take 8.4 g by mouth daily.    07/26/2020 at Unknown time  . warfarin (COUMADIN) 5 MG tablet TAKE 1 TABLET EVERY DAY OR AS DIRECTED BY COUMADIN CLINIC. (Patient taking differently: Take 5 mg by  mouth See admin instructions. TAKE 1 TABLET EVERY DAY OR AS DIRECTED BY COUMADIN CLINIC.) 100 tablet 0 07/26/2020 at Unknown time  . busPIRone (BUSPAR) 5 MG tablet TAKE 1 TABLET (5 MG TOTAL) BY MOUTH 2 (TWO) TIMES DAILY AS NEEDED. (Patient not  taking: Reported on 07/27/2020) 180 tablet 1 Not Taking at Unknown time  . loperamide (IMODIUM) 2 MG capsule Take 1 capsule (2 mg total) by mouth as needed for diarrhea or loose stools. (Patient not taking: Reported on 07/27/2020) 30 capsule 0 Not Taking at Unknown time  . mupirocin ointment (BACTROBAN) 2 % Apply 1 application topically daily. Apply to dorsal foot wound daily (Patient not taking: Reported on 07/27/2020) 30 g 2 Not Taking at Unknown time  . sodium zirconium cyclosilicate (LOKELMA) 10 g PACK packet Take 10 g by mouth daily.    unk   Scheduled:  . aspirin  81 mg Oral Daily  . atorvastatin  80 mg Oral Daily  . Chlorhexidine Gluconate Cloth  6 each Topical Daily  . clopidogrel  75 mg Oral Q breakfast  . ferric citrate  210 mg Oral TID WC  . insulin aspart  0-15 Units Subcutaneous TID WC  . insulin aspart  0-5 Units Subcutaneous QHS  . insulin glargine  40 Units Subcutaneous BID  . insulin regular human CONCENTRATED  40 Units Subcutaneous TID WC  . metoCLOPramide (REGLAN) injection  5 mg Intravenous Once  . ondansetron      . pantoprazole  40 mg Oral Daily  . patiromer  8.4 g Oral Daily  . sevelamer carbonate  2,400 mg Oral TID WC  . sodium chloride flush  3 mL Intravenous Q12H  . Warfarin - Pharmacist Dosing Inpatient   Does not apply q1600    Assessment: 54 yo male with NSTEMI s/p cath with DES. He is on coumadin PTA for afib and pharmacy consulted to restart. Plans noted to drop ASA in 2-4 weeks, for now continues on ASA and Plavix.   Today, his CBC remains wnl with Hgb 13.1 and plt 382. INR remains subtherapeutic at 1.4 after resuming warfarin yesterday. No bleeding noted. Will continue with current dose given how recently it was  resumed.   -INR= 1.8 > 1.4 -Doses = 5 mg x1    Home dose: 50m/d  Goal of Therapy:  INR 2-3 Monitor platelets by anticoagulation protocol: Yes   Plan:  -Warfarin 524mpo again today -Daily PT/INR -Daily CBC  ThClaudina LickPharmD PGY1 Acute Care Pharmacy Resident  07/30/2020 10:10 AM  Please check [email or web address] for unit-specific pharmacy phone numbers.

## 2020-07-30 NOTE — Progress Notes (Signed)
Telephone call to [facility] at 1825 and informed primary nurse pt scheduled for 3 hour HD treatment tonight. Given status report from patient's primary nurse Judeen Hammans, RN- indicated just stabilized pt's BP; therefore, she will need to check to see if patient able to have HD treatment tonight. I called on-call Nephrologist Dr. Carolin Sicks and informed him of patient's status report from [facility] nurse.  Per Dr. Carolin Sicks await 2H to indicate if patient stable for HD treatment tonight and if informed patient not stable for HD tonight place on schedule for HD tomorrow 07/31/20.  Plan: awaiting 2H to return call as indicated regarding patient status.

## 2020-07-30 NOTE — Progress Notes (Addendum)
Progress Note  Patient Name: [name] [name] Date of Encounter: 07/30/2020  [facility] Cardiologist: Lauree Chandler, MD   Subjective   Peripheral blood pressure recordings have been low since admission to the hospital usually around 100 mmHg but varying between 60 and 100 mmHg.  Blood pressure has been worse since dialysis 48 hours ago.  New problem is nausea.  Developed significant bradycardia into the 40s last evening.  Transferred to the unit and placed on dopamine.  Dopamine has been increased to the max dose.  This may now be contributing to nausea.  Denies dyspnea.  Wants to have a bowel movement, but has been unable to get out of bed.  Inpatient Medications    Scheduled Meds: . aspirin  81 mg Oral Daily  . atorvastatin  80 mg Oral Daily  . Chlorhexidine Gluconate Cloth  6 each Topical Daily  . clopidogrel  75 mg Oral Q breakfast  . ferric citrate  210 mg Oral TID WC  . insulin aspart  0-15 Units Subcutaneous TID WC  . insulin aspart  0-5 Units Subcutaneous QHS  . insulin glargine  40 Units Subcutaneous BID  . insulin regular human CONCENTRATED  40 Units Subcutaneous TID WC  . ondansetron      . pantoprazole  40 mg Oral Daily  . patiromer  8.4 g Oral Daily  . sevelamer carbonate  2,400 mg Oral TID WC  . sodium chloride flush  3 mL Intravenous Q12H  . Warfarin - Pharmacist Dosing Inpatient   Does not apply q1600   Continuous Infusions: . sodium chloride    . DOPamine 15 mcg/kg/min (07/30/20 0834)   PRN Meds: sodium chloride, acetaminophen, clonazePAM, sodium chloride flush   Vital Signs    Vitals:   07/30/20 0630 07/30/20 0645 07/30/20 0700 07/30/20 0715  BP: (!) 95/15 98/70 95/60  (!) 92/38  Pulse: (!) 51 (!) 53 (!) 52 (!) 56  Resp: 14 18 20 17   Temp:      TempSrc:      SpO2: 94% 95% 93% (!) 88%  Weight:      Height:        Intake/Output Summary (Last 24 hours) at 07/30/2020 0945 Last data filed at 07/30/2020 0700 Gross per 24 hour  Intake  322.14 ml  Output --  Net 322.14 ml   Last 3 Weights 07/28/2020 07/28/2020 07/27/2020  Weight (lbs) 286 lb 13.1 oz 296 lb 1.2 oz 284 lb 6.3 oz  Weight (kg) 130.1 kg 134.3 kg 129 kg  Some encounter information is confidential and restricted. Go to Review Flowsheets activity to see all data.      Telemetry    Atrial flutter with high-grade AV block despite high-dose dopamine.- Personally Reviewed  ECG    N/A  Physical Exam  Morbidly obese. GEN: Pale in appearance Neck:  Unable to assess JVD Cardiac: regular, no murmurs, rubs, or gallops.  Respiratory: Clear to auscultation bilaterally. GI: Soft, nontender, non-distended  MS: No edema; No deformity. Neuro:  Nonfocal  Psych: Normal affect   Labs    High Sensitivity Troponin:   Recent Labs  Lab 07/27/20 1122 07/27/20 1320  TROPONINIHS >27,000* >27,000*      Chemistry Recent Labs  Lab 07/27/20 1122 07/28/20 0121 07/30/20 0504  NA 136 132* 138  K 4.3 5.1 5.0  CL 91* 92* 97*  CO2 30 26 23   GLUCOSE 148* 330* 96  BUN 49* 57* 62*  CREATININE 9.20* 10.21* 9.11*  CALCIUM 8.8* 8.4* 9.0  GFRNONAA  6* 5* 6*  ANIONGAP 15 14 18*     Hematology Recent Labs  Lab 07/27/20 1122 07/28/20 0121 07/30/20 0504  WBC 8.4 8.4 17.2*  RBC 4.05* 3.80* 4.49  HGB 12.0* 11.3* 13.1  HCT 39.9 36.1* 43.3  MCV 98.5 95.0 96.4  MCH 29.6 29.7 29.2  MCHC 30.1 31.3 30.3  RDW 18.5* 18.2* 19.0*  PLT 247 240 382    Radiology    ECHOCARDIOGRAM COMPLETE  Result Date: 07/29/2020    ECHOCARDIOGRAM REPORT   Patient Name:   TYRONN GOLDA Date of Exam: 07/29/2020 Medical Rec #:  [id number]      Height:       70.0 in Accession #:    [id number]     Weight:       286.8 lb Date of Birth:  December 03, 1965      BSA:          2.433 m Patient Age:    54 years       BP:           82/55 mmHg Patient Gender: M              HR:           97 bpm. Exam Location:  Inpatient Procedure: 2D Echo Indications:    NSTEMI I21.4  History:        Patient has prior history  of Echocardiogram examinations, most                 recent 11/20/2019. CHF, COPD; Risk Factors:Diabetes, Hypertension                 and Dyslipidemia. Endocarditis.  Sonographer:    Jannett Celestine RDCS (AE) Referring Phys: [place] Comments: Patient is morbidly obese. Image acquisition challenging due to patient body habitus. IMPRESSIONS  1. Extremely limited; probable distal septal and apical akinesis with overall mild to moderate LV dysfunction.  2. Left ventricular ejection fraction, by estimation, is 40 to 45%. The left ventricle has mildly decreased function. The left ventricle demonstrates regional wall motion abnormalities (see scoring diagram/findings for description). There is mild left ventricular hypertrophy. Left ventricular diastolic function could not be evaluated.  3. Right ventricular systolic function is normal. The right ventricular size is normal.  4. The mitral valve is normal in structure. No evidence of mitral valve regurgitation. No evidence of mitral stenosis. Moderate mitral annular calcification.  5. The aortic valve is tricuspid. Aortic valve regurgitation is not visualized. No aortic stenosis is present.  6. The inferior vena cava is dilated in size with <50% respiratory variability, suggesting right atrial pressure of 15 mmHg. FINDINGS  Left Ventricle: Left ventricular ejection fraction, by estimation, is 40 to 45%. The left ventricle has mildly decreased function. The left ventricle demonstrates regional wall motion abnormalities. The left ventricular internal cavity size was normal in size. There is mild left ventricular hypertrophy. Left ventricular diastolic function could not be evaluated due to atrial fibrillation. Left ventricular diastolic function could not be evaluated. Right Ventricle: The right ventricular size is normal.Right ventricular systolic function is normal. Left Atrium: Left atrial size was normal in size. Right Atrium: Right atrial  size was normal in size. Pericardium: There is no evidence of pericardial effusion. Mitral Valve: The mitral valve is normal in structure. Moderate mitral annular calcification. No evidence of mitral valve regurgitation. No evidence of mitral valve stenosis. Tricuspid Valve: The tricuspid valve is normal in structure. Tricuspid valve regurgitation is  not demonstrated. No evidence of tricuspid stenosis. Aortic Valve: The aortic valve is tricuspid. Aortic valve regurgitation is not visualized. No aortic stenosis is present. Pulmonic Valve: The pulmonic valve was not well visualized. Pulmonic valve regurgitation is not visualized. No evidence of pulmonic stenosis. Aorta: The aortic root is normal in size and structure. Venous: The inferior vena cava is dilated in size with less than 50% respiratory variability, suggesting right atrial pressure of 15 mmHg.  Additional Comments: Extremely limited; probable distal septal and apical akinesis with overall mild to moderate LV dysfunction.  LEFT VENTRICLE PLAX 2D LVIDd:         4.60 cm LVIDs:         3.50 cm LV PW:         1.20 cm LV IVS:        1.65 cm LVOT diam:     2.10 cm LV SV:         62 LV SV Index:   26 LVOT Area:     3.46 cm  LEFT ATRIUM             Index LA diam:        5.10 cm 2.10 cm/m LA Vol (A2C):   85.5 ml 35.14 ml/m LA Vol (A4C):   61.0 ml 25.07 ml/m LA Biplane Vol: 74.9 ml 30.79 ml/m  AORTIC VALVE LVOT Vmax:   101.00 cm/s LVOT Vmean:  75.500 cm/s LVOT VTI:    0.180 m  AORTA Ao Root diam: 3.60 cm MITRAL VALVE MV Area (PHT): 4.49 cm     SHUNTS MV Decel Time: 169 msec     Systemic VTI:  0.18 m MV E velocity: 131.00 cm/s  Systemic Diam: 2.10 cm Kirk Ruths MD Electronically signed by Kirk Ruths MD Signature Date/Time: 07/29/2020/12:27:11 PM    Final     Cardiac Studies   CORONARY STENT INTERVENTION  LEFT HEART CATH AND CORONARY ANGIOGRAPHY  Conclusion    Lat Ramus lesion is 100% stenosed.  1st Mrg lesion is 100% stenosed.  Prox LAD  lesion is 99% stenosed.  A drug-eluting stent was successfully placed using a STENT RESOLUTE ONYX 3.5X18.  Post intervention, there is a 0% residual stenosis.   1. Severe proximal to mid LAD stenosis 2. Chronic occlusion of the distal intermediate branch, small in caliber 3. Complete occlusion of the distal segment of the first obtuse marginal branch. Cannot exclude that this was the infarct vessel but the patient was pain free and this vessel did not appear large enough for PCI. There is collateral filling of lateral wall branches from the RCA 4. Successful PTCA/DES x 1 proximal to mid LAD  Recommendations: Will admit to the ICU. Will continue DAPT with ASA and Plavix. Resume coumadin prior to discharge. I would plan to use triple therapy with ASA, Plavix and coumadin for one month then stop ASA. Continue statin. Will not start a beta blocker given his soft BP. Echo tomorrow.  Diagnostic Dominance: Right  Intervention     Patient Profile     54 y.o. male  with hx of ESRD on dialysis, COPD, AI, LBBB, OSA, DM II, chronic atrial flutter, chronic anticoagulation, chronic DVT,  chronic diastolic HF, history of endocarditis, presents with chest pain - found to have NSTEMI --> LAD stent  Assessment & Plan    1. Coronary atherosclerotic heart disease with non-ST elevation MI: Treated with LAD stent.  LAD was totally occluded. 2. Acute systolic and diastolic heart failure: Only management strategy  is volume control by dialysis.  With AV block, bradycardia, and hypotension, heart failure regimen is not possible. 3. Atrial flutter: The dopamine is not doing very much for the patient's heart rate.  Will wean and DC dopamine.  If we have to, may need to use Levophed. 4. Hypotension: Again this is difficult due to vascular access issues.  Not sure if pressures are correct. May need to have an intra-arterial pressure recording performed.  We will wean and DC dopamine.  Need to add midodrine.  May  need right heart cath to assess filling pressures. If BP truly this low will need Levophed. 5. END-stage kidney disease: ?Will needs dialysis today.  Had to give saline yesterday to try to support blood pressure. Will need higher dry weight. MI now requires higher filling pressure to keep BP up.  PROGNOSIS: Prognosis is guarded given low blood pressures, difficulty verifying exactly what his blood pressures are, and diffuse vascular disease with recent MI.  Currently blood pressures are low enough that dialysis may be unsafe.  Also has high-grade AV block which has been chronic and now seemingly worse, possibly with a vagal component from nausea.  Hopefully nausea will resolve off high-dose dopamine.  We need to establish CODE STATUS and will need the help of nephrology relative to long-term prognosis.  For questions or updates, please contact [place] Please consult [email or web address] for contact info under        Signed, Sinclair Grooms, MD  07/30/2020, 9:45 AM

## 2020-07-31 ENCOUNTER — Encounter (HOSPITAL_COMMUNITY): Payer: Self-pay | Admitting: Cardiovascular Disease

## 2020-07-31 ENCOUNTER — Ambulatory Visit (HOSPITAL_COMMUNITY): Payer: Medicare HMO

## 2020-07-31 ENCOUNTER — Telehealth: Payer: Self-pay | Admitting: Cardiovascular Disease

## 2020-07-31 DIAGNOSIS — N186 End stage renal disease: Secondary | ICD-10-CM | POA: Diagnosis not present

## 2020-07-31 DIAGNOSIS — I214 Non-ST elevation (NSTEMI) myocardial infarction: Secondary | ICD-10-CM | POA: Diagnosis not present

## 2020-07-31 DIAGNOSIS — I10 Essential (primary) hypertension: Secondary | ICD-10-CM

## 2020-07-31 DIAGNOSIS — I959 Hypotension, unspecified: Secondary | ICD-10-CM

## 2020-07-31 DIAGNOSIS — G4733 Obstructive sleep apnea (adult) (pediatric): Secondary | ICD-10-CM | POA: Diagnosis not present

## 2020-07-31 LAB — PROTIME-INR
INR: 2 — ABNORMAL HIGH (ref 0.8–1.2)
Prothrombin Time: 22.1 seconds — ABNORMAL HIGH (ref 11.4–15.2)

## 2020-07-31 LAB — BASIC METABOLIC PANEL
Anion gap: 16 — ABNORMAL HIGH (ref 5–15)
BUN: 88 mg/dL — ABNORMAL HIGH (ref 6–20)
CO2: 19 mmol/L — ABNORMAL LOW (ref 22–32)
Calcium: 8.4 mg/dL — ABNORMAL LOW (ref 8.9–10.3)
Chloride: 101 mmol/L (ref 98–111)
Creatinine, Ser: 10.95 mg/dL — ABNORMAL HIGH (ref 0.61–1.24)
GFR, Estimated: 5 mL/min — ABNORMAL LOW (ref >60)
Glucose, Bld: 54 mg/dL — ABNORMAL LOW (ref 70–99)
Potassium: 4.9 mmol/L (ref 3.5–5.1)
Sodium: 136 mmol/L (ref 135–145)

## 2020-07-31 LAB — GLUCOSE, CAPILLARY
Glucose-Capillary: 124 mg/dL — ABNORMAL HIGH (ref 70–99)
Glucose-Capillary: 40 mg/dL — CL (ref 70–99)
Glucose-Capillary: 79 mg/dL (ref 70–99)
Glucose-Capillary: 85 mg/dL (ref 70–99)
Glucose-Capillary: 111 mg/dL — ABNORMAL HIGH (ref 70–99)
Glucose-Capillary: 64 mg/dL — ABNORMAL LOW (ref 70–99)
Glucose-Capillary: 55 mg/dL — ABNORMAL LOW (ref 70–99)
Glucose-Capillary: 91 mg/dL (ref 70–99)

## 2020-07-31 LAB — POCT I-STAT, CHEM 8
BUN: 90 mg/dL — ABNORMAL HIGH (ref 6–20)
Calcium, Ion: 1.13 mmol/L — ABNORMAL LOW (ref 1.15–1.40)
Chloride: 102 mmol/L (ref 98–111)
Creatinine, Ser: 11.7 mg/dL — ABNORMAL HIGH (ref 0.61–1.24)
Glucose, Bld: 46 mg/dL — ABNORMAL LOW (ref 70–99)
HCT: 36 % — ABNORMAL LOW (ref 39.0–52.0)
Hemoglobin: 12.2 g/dL — ABNORMAL LOW (ref 13.0–17.0)
Potassium: 5 mmol/L (ref 3.5–5.1)
Sodium: 136 mmol/L (ref 135–145)
TCO2: 23 mmol/L (ref 22–32)

## 2020-07-31 LAB — CBC
HCT: 37.8 % — ABNORMAL LOW (ref 39.0–52.0)
Hemoglobin: 11.4 g/dL — ABNORMAL LOW (ref 13.0–17.0)
MCH: 28.9 pg (ref 26.0–34.0)
MCHC: 30.2 g/dL (ref 30.0–36.0)
MCV: 95.7 fL (ref 80.0–100.0)
Platelets: 329 10*3/uL (ref 150–400)
RBC: 3.95 MIL/uL — ABNORMAL LOW (ref 4.22–5.81)
RDW: 18.7 % — ABNORMAL HIGH (ref 11.5–15.5)
WBC: 12.3 10*3/uL — ABNORMAL HIGH (ref 4.0–10.5)
nRBC: 0 % (ref 0.0–0.2)

## 2020-07-31 LAB — LACTIC ACID, PLASMA: Lactic Acid, Venous: 0.8 mmol/L (ref 0.5–1.9)

## 2020-07-31 LAB — CORTISOL-AM, BLOOD: Cortisol - AM: 27.3 ug/dL — ABNORMAL HIGH (ref 6.7–22.6)

## 2020-07-31 MED ORDER — INSULIN GLARGINE 100 UNIT/ML ~~LOC~~ SOLN
20.0000 [IU] | Freq: Two times a day (BID) | SUBCUTANEOUS | Status: DC
  Administered 2020-08-01: 20 [IU] via SUBCUTANEOUS
  Filled 2020-07-31 (×5): qty 0.2

## 2020-07-31 MED ORDER — DEXTROSE 50 % IV SOLN: 50.0000 mL | Freq: Once | INTRAVENOUS | Status: AC

## 2020-07-31 MED ORDER — PIPERACILLIN-TAZOBACTAM 3.375 G IVPB 30 MIN: 3.3750 g | Freq: Three times a day (TID) | INTRAVENOUS | Status: DC

## 2020-07-31 MED ORDER — INSULIN REGULAR HUMAN (CONC) 500 UNIT/ML ~~LOC~~ SOPN
20.0000 [IU] | PEN_INJECTOR | Freq: Three times a day (TID) | SUBCUTANEOUS | Status: DC
  Filled 2020-07-31: qty 3

## 2020-07-31 MED ORDER — DEXTROSE 50 % IV SOLN
12.5000 g | INTRAVENOUS | Status: AC
  Administered 2020-07-31: 12.5 g via INTRAVENOUS

## 2020-07-31 MED ORDER — MIDODRINE HCL 5 MG PO TABS
10.0000 mg | ORAL_TABLET | Freq: Three times a day (TID) | ORAL | Status: DC
  Administered 2020-07-31 – 2020-08-04 (×13): 10 mg via ORAL
  Filled 2020-07-31 (×14): qty 2

## 2020-07-31 MED ORDER — PIPERACILLIN-TAZOBACTAM IN DEX 2-0.25 GM/50ML IV SOLN
2.2500 g | Freq: Three times a day (TID) | INTRAVENOUS | Status: DC
  Administered 2020-07-31 – 2020-08-02 (×7): 2.25 g via INTRAVENOUS
  Filled 2020-07-31 (×11): qty 50

## 2020-07-31 MED ORDER — WARFARIN SODIUM 5 MG PO TABS
5.0000 mg | ORAL_TABLET | Freq: Once | ORAL | Status: AC
  Administered 2020-07-31: 5 mg via ORAL
  Filled 2020-07-31: qty 1

## 2020-07-31 MED ORDER — DEXTROSE 50 % IV SOLN
INTRAVENOUS | Status: AC
  Administered 2020-07-31: 50 mL via INTRAVENOUS
  Filled 2020-07-31: qty 50

## 2020-07-31 NOTE — Progress Notes (Signed)
Status report by primary RN Judeen Hammans vascular status unstable with hypotension requiring BP support with titration; therefore, pt unstable regarding HD tx will follow plan as outlined by Dr Carolin Sicks if deemed unstable by [facility] for HD treatment 07/30/20 Tx scheduled for HD 07/31/20 HD order modified.

## 2020-07-31 NOTE — Progress Notes (Signed)
Inpatient Diabetes Program Recommendations  AACE/ADA: New Consensus Statement on Inpatient Glycemic Control (2015)  Target Ranges:  Prepandial:   less than 140 mg/dL      Peak postprandial:   less than 180 mg/dL (1-2 hours)      Critically ill patients:  140 - 180 mg/dL   Lab Results  Component Value Date   GLUCAP 79 07/31/2020   HGBA1C 6.8 (H) 07/28/2020    Review of Glycemic Control Results for [name] [name], [name] [name] (MRN [id number]) as of 07/31/2020 10:15  Ref. Range 07/30/2020 21:33 07/31/2020 04:04 07/31/2020 04:29 07/31/2020 06:17  Glucose-Capillary Latest Ref Range: 70 - 99 mg/dL 104 (H) 40 (LL) 124 (H) 79   Diabetes history: DM 2 Outpatient Diabetes medications:  U500 50 units with breakfast, 70 units with lunch, and 60 units with dinner Toujeo 80 units bid  Current orders for Inpatient glycemic control:  Novolog moderate tid with meals and HS Lantus 40 units bid U500 40 units tid  Inpatient Diabetes Program Recommendations:    Consider reducing Lantus to 20 units bid and reduce U500 insulin to 20 units tid with meals (hold if patient NPO).    Thanks,  Adah Perl, RN, BC-ADM Inpatient Diabetes Coordinator Pager [phone] (8a-5p)

## 2020-07-31 NOTE — Progress Notes (Signed)
[place] for warfarin>>heparin Indication: atrial fibrillation  Allergies  Allergen Reactions  . Pork-Derived Products Anaphylaxis and Other (See Comments)    Fever also   . Ace Inhibitors Cough  . Hydrocodone Other (See Comments)    Hallucinations  . Pork Allergy     Other reaction(s): Unknown    Patient Measurements: Height: _0  (177.8 cm) Weight: 133.3 kg (293 lb 14 oz) IBW/kg (Calculated) : 73  Vital Signs: Temp: 98.1 F (36.7 C) (10/20 1539) Temp Source: Oral (10/20 1539) Pulse Rate: 102 (10/20 1600)  Labs: Recent Labs    07/30/20 0504 07/30/20 0504 07/30/20 1150 07/30/20 1150 07/30/20 1231 07/30/20 1411 07/31/20 0328 07/31/20 0404  HGB 13.1   < > 9.5*   < >  --   --  11.4* 12.2*  HCT 43.3   < > 31.9*  --   --   --  37.8* 36.0*  PLT 382  --  286  --   --   --  329  --   LABPROT 16.2*  --   --   --   --   --  22.1*  --   INR 1.4*  --   --   --   --   --  2.0*  --   CREATININE 9.11*   < > 9.55*  --   --   --  10.95* 11.70*  TROPONINIHS  --   --   --   --  6,789* 3,810*  --   --    < > = values in this interval not displayed.    Estimated Creatinine Clearance: 9.7 mL/min (A) (by C-G formula based on SCr of 11.7 mg/dL (H)).   Medical History: Past Medical History:  Diagnosis Date  . Allergic rhinitis   . Allergy   . Anemia   . Aortic insufficiency    a. Echo 7/16:  Mod LVH, EF 50-55%, mild to mod AI, severe LAE, PASP 57 mmHg (LV ID end diastolic 17.5 mm);  b. Echo 2/17:  Mod LVH, EF 50-55%, mod AI, MAC, mild MR, mod LAE, mild to mod TR, PASP 63 mmHg  . Bilateral carpal tunnel syndrome   . Cataract    removed both eyes   . CHF (congestive heart failure) ([place])   . Claustrophobia   . COPD (chronic obstructive pulmonary disease) ([place])   . Diabetes mellitus    Type II  . Diabetic peripheral neuropathy ([place])   . Diabetic retinopathy ([place])   . Discitis of lumbar region    resolved  . DVT (deep venous thrombosis)  ([place]) 10/07/2015   LLE  . Endocarditis 11/28/14  . ESRD (end stage renal disease) on dialysis [facility])    s/p renal transplant in 2009, failed in 2016  . Gait disorder   . Gastroparesis   . GERD (gastroesophageal reflux disease)   . Gout   . Hearing difficulty   . Hemodialysis patient ([place])    tuesday, thursday, saturday   . Hyperlipidemia   . Hypertension   . Incomplete bladder emptying   . Leg pain 02/05/11   with walking  . Morbid obesity ([place])   . Obstructive sleep apnea    not using CPAP- lost 100lbs  . Osteomyelitis ([place])   . Posttraumatic stress disorder   . Rotator cuff disorder   . Secondary hyperparathyroidism ([place])   . Sepsis ([place])    Postive blood cultures -   . Sleep apnea    no  cpap  . Urethral stricture   . Vitamin D deficiency     Medications:  Medications Prior to Admission  Medication Sig Dispense Refill Last Dose  . Accu-Chek FastClix Lancets MISC USE TO CHECK BLOOD SUGARS 4 TIMES A DAY. (Patient taking differently: 1 each by Other route See admin instructions. USE TO CHECK BLOOD SUGARS 4 TIMES A DAY.) 360 each 4 07/27/2020 at Unknown time  . allopurinol (ZYLOPRIM) 100 MG tablet Take 1 tablet (100 mg total) by mouth 3 (three) times a week. After dialysis 30 tablet 6 Past Week at Unknown time  . AURYXIA 1 GM 210 MG(Fe) tablet Take 210 mg by mouth 3 (three) times daily with meals.    07/26/2020 at Unknown time  . calcium acetate (PHOSLO) 667 MG capsule Take 3 capsules (2,001 mg total) by mouth 3 (three) times daily with meals. 270 capsule 0 07/26/2020 at Unknown time  . clonazePAM (KLONOPIN) 0.5 MG tablet Take 1 tablet (0.5 mg total) by mouth 2 (two) times daily as needed for anxiety. 60 tablet 0 07/26/2020 at Unknown time  . DROPLET PEN NEEDLES 32G X 4 MM MISC USE  TO INJECT FIVE TIMES DAILY AS DIRECTED (Patient taking differently: 1 each by Other route See admin instructions. Use to inject 5 times daily as directed.) 500 each 1 07/26/2020 at Unknown time  .  glucose blood (ACCU-CHEK GUIDE) test strip 1 each by Other route as needed for other. Use as instructed to check blood sugar 3 times daily. DX:E11.65 300 each 1 07/27/2020 at Unknown time  . hydrocortisone 1 % ointment Apply 1 application topically 2 (two) times daily. 30 g 0 Past Week at Unknown time  . hydrocortisone-pramoxine (PROCTOFOAM-HC) rectal foam Place 1 applicator rectally 2 (two) times daily. 30 g 0 Past Week at Unknown time  . Insulin Pen Needle 32G X 8 MM MISC Use with insulin x5 times a day (Patient taking differently: 1 each by Other route See admin instructions. Use with insulin x5 times a day) 500 each 1 07/26/2020 at Unknown time  . insulin regular human CONCENTRATED (HUMULIN R U-500 KWIKPEN) 500 UNIT/ML kwikpen INJECT 50 UNITS UNDER THE SKIN AT BREAKFAST, 70 UNITS AT LUNCH, AND 60 UNITS AT DINNER. (Patient taking differently: Inject 50-70 Units into the skin See admin instructions. INJECT 50 UNITS UNDER THE SKIN AT BREAKFAST, 70 UNITS AT LUNCH, AND 60 UNITS AT DINNER.) 12 mL 0 07/26/2020 at Unknown time  . omeprazole (PRILOSEC) 20 MG capsule Take 1 capsule (20 mg total) by mouth daily. Annual appt due in Oct must see provider for future refills 90 capsule 1 07/26/2020 at Unknown time  . RENVELA 800 MG tablet Take 2,400 mg by mouth 3 (three) times daily with meals.    07/26/2020 at Unknown time  . rosuvastatin (CRESTOR) 20 MG tablet Take 1 tablet (20 mg total) by mouth at bedtime. Annual appt due in Oct must see provider for future refills 90 tablet 1 07/26/2020 at Unknown time  . TOUJEO MAX SOLOSTAR 300 UNIT/ML Solostar Pen INJECT 80 UNITS UNDER THE SKIN IN THE MORNING AND 80 UNITS IN THE EVENING (Patient taking differently: Inject 80 Units into the skin See admin instructions. INJECT 80 UNITS UNDER THE SKIN IN THE MORNING AND 80 UNITS IN THE EVENING) 48 mL 1 07/26/2020 at Unknown time  . VELTASSA 8.4 g packet Take 8.4 g by mouth daily.    07/26/2020 at Unknown time  . warfarin  (COUMADIN) 5 MG tablet TAKE 1 TABLET EVERY  DAY OR AS DIRECTED BY COUMADIN CLINIC. (Patient taking differently: Take 5 mg by mouth See admin instructions. TAKE 1 TABLET EVERY DAY OR AS DIRECTED BY COUMADIN CLINIC.) 100 tablet 0 07/26/2020 at Unknown time  . busPIRone (BUSPAR) 5 MG tablet TAKE 1 TABLET (5 MG TOTAL) BY MOUTH 2 (TWO) TIMES DAILY AS NEEDED. (Patient not taking: Reported on 07/27/2020) 180 tablet 1 Not Taking at Unknown time  . loperamide (IMODIUM) 2 MG capsule Take 1 capsule (2 mg total) by mouth as needed for diarrhea or loose stools. (Patient not taking: Reported on 07/27/2020) 30 capsule 0 Not Taking at Unknown time  . mupirocin ointment (BACTROBAN) 2 % Apply 1 application topically daily. Apply to dorsal foot wound daily (Patient not taking: Reported on 07/27/2020) 30 g 2 Not Taking at Unknown time  . sodium zirconium cyclosilicate (LOKELMA) 10 g PACK packet Take 10 g by mouth daily.    unk   Scheduled:  . aspirin  81 mg Oral Daily  . atorvastatin  80 mg Oral Daily  . Chlorhexidine Gluconate Cloth  6 each Topical Daily  . clopidogrel  75 mg Oral Q breakfast  . ferric citrate  210 mg Oral TID WC  . insulin aspart  0-15 Units Subcutaneous TID WC  . insulin aspart  0-5 Units Subcutaneous QHS  . insulin glargine  20 Units Subcutaneous BID  . insulin regular human CONCENTRATED  20 Units Subcutaneous TID WC  . midodrine  10 mg Oral TID WC  .  morphine injection  0.5 mg Intravenous Once  . pantoprazole  40 mg Oral Daily  . patiromer  8.4 g Oral Daily  . sevelamer carbonate  2,400 mg Oral TID WC  . sodium chloride flush  3 mL Intravenous Q12H  . Warfarin - Pharmacist Dosing Inpatient   Does not apply q1600    Assessment: 54 yo male with NSTEMI s/p cath with DES. He is on coumadin PTA for afib and pharmacy consulted to restart. Plans noted to drop ASA in 2-4 weeks, for now continues on ASA and Plavix.   Today, his CBC remains wnl with Hgb 12.2 and plt 329. INR just therapeutic at  2. Warfarin in was resumed 10/18, but 10/19 dose was not given. Warfarin dose given already this afternoon 10/20. Orders to transition warfarin to heparin once INR trends down <2. With dose given already will follow up tomorrow morning.   Home warfarin dose: 49m/d  Goal of Therapy:   Monitor platelets by anticoagulation protocol: Yes   Plan:  -Warfarin 543malready given -Daily PT/INR -Daily CBC -Heparin once INR<2  FrErin HearingharmD., BCPS Clinical Pharmacist 07/31/2020 5:12 PM

## 2020-07-31 NOTE — Telephone Encounter (Signed)
Patient returned my call.  He is admitted at [facility] and asks that Dr. Angelena Form review his chart and see if there is anything different to do. He did speak with Dr. Tamala Julian this morning. The patient is concerned "worried, scary"   I adv to talk with the cardiologist again tomorrow morning, and to talk to the nursing staff at hospital.  Reassurance provided.  Pt thanked me for calling.

## 2020-07-31 NOTE — Progress Notes (Signed)
Patient currently finishing dialysis treatment. Nuc Med does not have medication to do HIDA scan. Advised they will have medication in the am. Patient is to be placed on clear liquid diet and then NPO at midnight for planned HIDA scan in AM. Pt is to also have no narcotics 6 hours before scan.  Verified diet orders with Obie Dredge PA.

## 2020-07-31 NOTE — Consult Note (Signed)
[facility] Surgery Consult Note  [name] [name] 1966-07-13  [id number].    Requesting MD: Angelena Form Chief Complaint/Reason for Consult: Possible cholecystitis HPI:  Patient is a 54 year old male with PMH COPD, ESRD on HD, Aortic insufficiency, CHF, Hx of endocarditis, Hx of DVT on warfarin, PTSD, GERD, T2DM, gastroparesis, HTN, HLD, Gout, OSA, Morbid obesity, secondary hyperparathyroidism, Hx of lumbar discitis. He presented to [facility] 10/16 with chest pain in A.Fib with RVR. Cardiac workup showed NSTEMI and patient was taken for cath. This showed severe proximal to mid-LAD stenosis s/p DESx1 and EF of 35-40%. Became hypotensive and bradycardic 10/18 and transferred to ICU, started on dopamine. Per chart review the patient also reported feeling nauseated and vomiting for the day prior. Initially denied abdominal pain but later complained of epigastric pain. Because of this a RUQ ultrasound was ordered that showed gallstones, 13 mm gallbladder wall, and pericholecystitic fluid concerning for possible cholecystitis. General surgery has been asked to evaluate the patient.   Patient reports he has a history of rectal pain related to hemorrhoids that are worse when he has diarrhea but denies other abdominal pain. Denies post-prandial abdominal pain, nausea, or vomiting. Denies epigastric pain that radiates to his back/flank. Reports intermittent constipation over the last month.   Past abdominal surgery includes kidney transplant (2009), denies other abdominal surgeries. Patient currently on warfarin and plavix. Has not received COVID vaccinations. Denies alcohol, illicit drug, and tobacco use. Patient lives alone, states he is divorced.  ROS: Review of Systems  Constitutional: Negative for chills and fever.  HENT: Negative.   Eyes: Negative.   Respiratory: Negative.   Cardiovascular: Positive for chest pain and palpitations. Negative for leg swelling.  Gastrointestinal: Negative for abdominal  pain, blood in stool, melena, nausea and vomiting.  Genitourinary: Positive for urgency.  Musculoskeletal: Negative.   Neurological: Negative.   Endo/Heme/Allergies: Negative.   Psychiatric/Behavioral: Negative.   All other systems reviewed and are negative.   Family History  Problem Relation Age of Onset  . Hypertension Mother   . Diabetes Mother   . Diabetes Father   . Diabetes Brother   . Colon cancer Neg Hx   . Colon polyps Neg Hx   . Esophageal cancer Neg Hx   . Rectal cancer Neg Hx   . Stomach cancer Neg Hx     Past Medical History:  Diagnosis Date  . Allergic rhinitis   . Allergy   . Anemia   . Aortic insufficiency    a. Echo 7/16:  Mod LVH, EF 50-55%, mild to mod AI, severe LAE, PASP 57 mmHg (LV ID end diastolic 82.9 mm);  b. Echo 2/17:  Mod LVH, EF 50-55%, mod AI, MAC, mild MR, mod LAE, mild to mod TR, PASP 63 mmHg  . Bilateral carpal tunnel syndrome   . Cataract    removed both eyes   . CHF (congestive heart failure) ([place])   . Claustrophobia   . COPD (chronic obstructive pulmonary disease) ([place])   . Diabetes mellitus    Type II  . Diabetic peripheral neuropathy ([place])   . Diabetic retinopathy ([place])   . Discitis of lumbar region    resolved  . DVT (deep venous thrombosis) ([place]) 10/07/2015   LLE  . Endocarditis 11/28/14  . ESRD (end stage renal disease) on dialysis [facility])    s/p renal transplant in 2009, failed in 2016  . Gait disorder   . Gastroparesis   . GERD (gastroesophageal reflux disease)   . Gout   .  Hearing difficulty   . Hemodialysis patient ([place])    tuesday, thursday, saturday   . Hyperlipidemia   . Hypertension   . Incomplete bladder emptying   . Leg pain 02/05/11   with walking  . Morbid obesity ([place])   . Obstructive sleep apnea    not using CPAP- lost 100lbs  . Osteomyelitis ([place])   . Posttraumatic stress disorder   . Rotator cuff disorder   . Secondary hyperparathyroidism ([place])   . Sepsis ([place])    Postive blood cultures -   . Sleep  apnea    no cpap  . Urethral stricture   . Vitamin D deficiency     Past Surgical History:  Procedure Laterality Date  . ARTERIOVENOUS GRAFT PLACEMENT Bilateral "several"  . AV FISTULA PLACEMENT Bilateral 2015  . AV FISTULA PLACEMENT Right 06/13/2019   Procedure: INSERTION OF ARTERIOVENOUS (AV) GORE-TEX GRAFT RIGHT  ARM;  Surgeon: Elam Dutch, MD;  Location: [place];  Service: Vascular;  Laterality: Right;  . BASCILIC VEIN TRANSPOSITION Right 09/27/2015  . [place] Right 09/27/2015   Procedure: BASILIC VEIN TRANSPOSITION right;  Surgeon: Mal Misty, MD;  Location: [place];  Service: Vascular;  Laterality: Right;  . CATARACT EXTRACTION W/ INTRAOCULAR LENS  IMPLANT, BILATERAL Bilateral   . CORONARY STENT INTERVENTION N/A 07/27/2020   Procedure: CORONARY STENT INTERVENTION;  Surgeon: Burnell Blanks, MD;  Location: [facility];  Service: Cardiovascular;  Laterality: N/A;  . CYSTOSCOPY W/ INTERNAL URETHROTOMY  09/2014  . IR THROMBECTOMY AV FISTULA W/THROMBOLYSIS/PTA INC/SHUNT/IMG RIGHT Right 08/01/2019  . IR US GUIDE VASC ACCESS RIGHT  08/01/2019  . KIDNEY TRANSPLANT  2009  . LEFT HEART CATH AND CORONARY ANGIOGRAPHY N/A 07/27/2020   Procedure: LEFT HEART CATH AND CORONARY ANGIOGRAPHY;  Surgeon: Burnell Blanks, MD;  Location: [place] [facility];  Service: Cardiovascular;  Laterality: N/A;  . SIGMOIDOSCOPY    . UPPER EXTREMITY VENOGRAPHY Bilateral 06/02/2019   Procedure: UPPER EXTREMITY VENOGRAPHY;  Surgeon: Elam Dutch, MD;  Location: [place] [facility];  Service: Cardiovascular;  Laterality: Bilateral;  . UPPER GASTROINTESTINAL ENDOSCOPY      Social History:  reports that he has never smoked. He has never used smokeless tobacco. He reports that he does not drink alcohol and does not use drugs.  Allergies:  Allergies  Allergen Reactions  . Pork-Derived Products Anaphylaxis and Other (See Comments)    Fever also   . Ace Inhibitors  Cough  . Hydrocodone Other (See Comments)    Hallucinations  . Pork Allergy     Other reaction(s): Unknown    Medications Prior to Admission  Medication Sig Dispense Refill  . Accu-Chek FastClix Lancets MISC USE TO CHECK BLOOD SUGARS 4 TIMES A DAY. (Patient taking differently: 1 each by Other route See admin instructions. USE TO CHECK BLOOD SUGARS 4 TIMES A DAY.) 360 each 4  . allopurinol (ZYLOPRIM) 100 MG tablet Take 1 tablet (100 mg total) by mouth 3 (three) times a week. After dialysis 30 tablet 6  . AURYXIA 1 GM 210 MG(Fe) tablet Take 210 mg by mouth 3 (three) times daily with meals.     . calcium acetate (PHOSLO) 667 MG capsule Take 3 capsules (2,001 mg total) by mouth 3 (three) times daily with meals. 270 capsule 0  . clonazePAM (KLONOPIN) 0.5 MG tablet Take 1 tablet (0.5 mg total) by mouth 2 (two) times daily as needed for anxiety. 60 tablet 0  . DROPLET PEN NEEDLES  32G X 4 MM MISC USE  TO INJECT FIVE TIMES DAILY AS DIRECTED (Patient taking differently: 1 each by Other route See admin instructions. Use to inject 5 times daily as directed.) 500 each 1  . glucose blood (ACCU-CHEK GUIDE) test strip 1 each by Other route as needed for other. Use as instructed to check blood sugar 3 times daily. DX:E11.65 300 each 1  . hydrocortisone 1 % ointment Apply 1 application topically 2 (two) times daily. 30 g 0  . hydrocortisone-pramoxine (PROCTOFOAM-HC) rectal foam Place 1 applicator rectally 2 (two) times daily. 30 g 0  . Insulin Pen Needle 32G X 8 MM MISC Use with insulin x5 times a day (Patient taking differently: 1 each by Other route See admin instructions. Use with insulin x5 times a day) 500 each 1  . insulin regular human CONCENTRATED (HUMULIN R U-500 KWIKPEN) 500 UNIT/ML kwikpen INJECT 50 UNITS UNDER THE SKIN AT BREAKFAST, 70 UNITS AT LUNCH, AND 60 UNITS AT DINNER. (Patient taking differently: Inject 50-70 Units into the skin See admin instructions. INJECT 50 UNITS UNDER THE SKIN AT BREAKFAST,  70 UNITS AT LUNCH, AND 60 UNITS AT DINNER.) 12 mL 0  . omeprazole (PRILOSEC) 20 MG capsule Take 1 capsule (20 mg total) by mouth daily. Annual appt due in Oct must see provider for future refills 90 capsule 1  . RENVELA 800 MG tablet Take 2,400 mg by mouth 3 (three) times daily with meals.     . rosuvastatin (CRESTOR) 20 MG tablet Take 1 tablet (20 mg total) by mouth at bedtime. Annual appt due in Oct must see provider for future refills 90 tablet 1  . TOUJEO MAX SOLOSTAR 300 UNIT/ML Solostar Pen INJECT 80 UNITS UNDER THE SKIN IN THE MORNING AND 80 UNITS IN THE EVENING (Patient taking differently: Inject 80 Units into the skin See admin instructions. INJECT 80 UNITS UNDER THE SKIN IN THE MORNING AND 80 UNITS IN THE EVENING) 48 mL 1  . VELTASSA 8.4 g packet Take 8.4 g by mouth daily.     Marland Kitchen warfarin (COUMADIN) 5 MG tablet TAKE 1 TABLET EVERY DAY OR AS DIRECTED BY COUMADIN CLINIC. (Patient taking differently: Take 5 mg by mouth See admin instructions. TAKE 1 TABLET EVERY DAY OR AS DIRECTED BY COUMADIN CLINIC.) 100 tablet 0  . busPIRone (BUSPAR) 5 MG tablet TAKE 1 TABLET (5 MG TOTAL) BY MOUTH 2 (TWO) TIMES DAILY AS NEEDED. (Patient not taking: Reported on 07/27/2020) 180 tablet 1  . loperamide (IMODIUM) 2 MG capsule Take 1 capsule (2 mg total) by mouth as needed for diarrhea or loose stools. (Patient not taking: Reported on 07/27/2020) 30 capsule 0  . mupirocin ointment (BACTROBAN) 2 % Apply 1 application topically daily. Apply to dorsal foot wound daily (Patient not taking: Reported on 07/27/2020) 30 g 2  . sodium zirconium cyclosilicate (LOKELMA) 10 g PACK packet Take 10 g by mouth daily.       Blood pressure 99/84, pulse 62, temperature 97.7 F (36.5 C), temperature source Oral, resp. rate 15, height _0  (1.778 m), weight 134 kg, SpO2 100 %. Physical Exam:  General: pleasant, obese, chronically ill appearing male, sitting up in bed eating a sandwich, NAD HEENT: head is normocephalic, atraumatic.   Sclera are noninjected.  PERRL.  Ears and nose without any masses or lesions.  Mouth is pink and moist Heart: sinus tachycardia (120 bpm on monitor).  Palpable radial pulses bilaterally, no LE edema Lungs: CTAB, distant breath sounds, no wheezes, Respiratory  effort nonlabored Abd: soft, nontender, ND, +BS, umbilical hernia that is nontender and easily reduces, negative murphy's sign MS: all 4 extremities are symmetrical with no cyanosis or edema. Skin: warm and dry with no masses, bilateral lower extremity lesions- purpruric.  Neuro: Cranial nerves 2-12 grossly intact, speech is normal Psych: A&Ox3 with an appropriate affect.    Results for orders placed or performed during the hospital encounter of 07/27/20 (from the past 48 hour(s))  Glucose, capillary     Status: Abnormal   Collection Time: 07/29/20  4:43 PM  Result Value Ref Range   Glucose-Capillary 292 (H) 70 - 99 mg/dL    Comment: Glucose reference range applies only to samples taken after fasting for at least 8 hours.  Glucose, capillary     Status: Abnormal   Collection Time: 07/29/20  9:15 PM  Result Value Ref Range   Glucose-Capillary 183 (H) 70 - 99 mg/dL    Comment: Glucose reference range applies only to samples taken after fasting for at least 8 hours.   Comment 1 Notify RN    Comment 2 Document in Chart   Glucose, capillary     Status: Abnormal   Collection Time: 07/29/20 10:19 PM  Result Value Ref Range   Glucose-Capillary 172 (H) 70 - 99 mg/dL    Comment: Glucose reference range applies only to samples taken after fasting for at least 8 hours.   Comment 1 Notify RN    Comment 2 Document in Chart   Protime-INR     Status: Abnormal   Collection Time: 07/30/20  5:04 AM  Result Value Ref Range   Prothrombin Time 16.2 (H) 11.4 - 15.2 seconds   INR 1.4 (H) 0.8 - 1.2    Comment: (NOTE) INR goal varies based on device and disease states. Performed at [facility], [place] [address], [place], [place] [address]    Basic metabolic panel     Status: Abnormal   Collection Time: 07/30/20  5:04 AM  Result Value Ref Range   Sodium 138 135 - 145 mmol/L   Potassium 5.0 3.5 - 5.1 mmol/L   Chloride 97 (L) 98 - 111 mmol/L   CO2 23 22 - 32 mmol/L   Glucose, Bld 96 70 - 99 mg/dL    Comment: Glucose reference range applies only to samples taken after fasting for at least 8 hours.   BUN 62 (H) 6 - 20 mg/dL   Creatinine, Ser 9.11 (H) 0.61 - 1.24 mg/dL   Calcium 9.0 8.9 - 10.3 mg/dL   GFR, Estimated 6 (L) >60 mL/min   Anion gap 18 (H) 5 - 15    Comment: Performed at [facility] [address] [place], [place] [address]  Magnesium     Status: None   Collection Time: 07/30/20  5:04 AM  Result Value Ref Range   Magnesium 2.3 1.7 - 2.4 mg/dL    Comment: Performed at [facility] [address] [place],  [address]  Phosphorus     Status: Abnormal   Collection Time: 07/30/20  5:04 AM  Result Value Ref Range   Phosphorus 5.7 (H) 2.5 - 4.6 mg/dL    Comment: Performed at [facility] [address], [place] [address]  CBC     Status: Abnormal   Collection Time: 07/30/20  5:04 AM  Result Value Ref Range   WBC 17.2 (H) 4.0 - 10.5 K/uL   RBC 4.49 4.22 - 5.81 MIL/uL  Hemoglobin 13.1 13.0 - 17.0 g/dL   HCT 43.3 39 - 52 %   MCV 96.4 80.0 - 100.0 fL   MCH 29.2 26.0 - 34.0 pg   MCHC 30.3 30.0 - 36.0 g/dL   RDW 19.0 (H) 11.5 - 15.5 %   Platelets 382 150 - 400 K/uL   nRBC 0.0 0.0 - 0.2 %    Comment: Performed at [facility] [address], [place], [place] [address]  Glucose, capillary     Status: None   Collection Time: 07/30/20  8:48 AM  Result Value Ref Range   Glucose-Capillary 82 70 - 99 mg/dL    Comment: Glucose reference range applies only to samples taken after fasting for at least 8 hours.  Cortisol     Status: None   Collection Time: 07/30/20 11:26 AM  Result Value Ref Range   Cortisol, Plasma 6.7 ug/dL    Comment: (NOTE) AM    6.7 - 22.6 ug/dL PM    <10.0       ug/dL Performed at [facility] [address] [place], [place] [address]   Glucose, capillary     Status: None   Collection Time: 07/30/20 11:40 AM  Result Value Ref Range   Glucose-Capillary 86 70 - 99 mg/dL    Comment: Glucose reference range applies only to samples taken after fasting for at least 8 hours.  Lactic acid, plasma     Status: Abnormal   Collection Time: 07/30/20 11:44 AM  Result Value Ref Range   Lactic Acid, Venous 3.6 (HH) 0.5 - 1.9 mmol/L    Comment: CRITICAL RESULT CALLED TO, READ BACK BY AND VERIFIED WITH: S.EDWARDS,RN 1239 07/30/20 CLARK,S Performed at [facility], [place] [address] [place], [place] [address]   CBC     Status: Abnormal   Collection Time: 07/30/20 11:50 AM  Result Value Ref Range   WBC 11.2 (H) 4.0 - 10.5 K/uL   RBC 3.23 (L) 4.22 - 5.81 MIL/uL   Hemoglobin 9.5 (L) 13.0 - 17.0 g/dL    Comment: REPEATED TO VERIFY   HCT 31.9 (L) 39 - 52 %   MCV 98.8 80.0 - 100.0 fL   MCH 29.4 26.0 - 34.0 pg   MCHC 29.8 (L) 30.0 - 36.0 g/dL   RDW 18.6 (H) 11.5 - 15.5 %   Platelets 286 150 - 400 K/uL   nRBC 0.2 0.0 - 0.2 %    Comment: Performed at [facility] [address], [place], [place] [address]  Basic metabolic panel     Status: Abnormal   Collection Time: 07/30/20 11:50 AM  Result Value Ref Range   Sodium 137 135 - 145 mmol/L   Potassium 6.2 (H) 3.5 - 5.1 mmol/L    Comment: SLIGHT HEMOLYSIS   Chloride 100 98 - 111 mmol/L   CO2 24 22 - 32 mmol/L   Glucose, Bld 94 70 - 99 mg/dL    Comment: Glucose reference range applies only to samples taken after fasting for at least 8 hours.   BUN 68 (H) 6 - 20 mg/dL   Creatinine, Ser 9.55 (H) 0.61 - 1.24 mg/dL   Calcium 8.0 (L) 8.9 - 10.3 mg/dL   GFR, Estimated 5 (L) >60 mL/min   Anion gap 13 5 - 15    Comment: Performed at [facility] [address], [place], [place] [address]  Troponin I (High Sensitivity)     Status: Abnormal  Collection Time: 07/30/20 12:31 PM   Result Value Ref Range   Troponin I (High Sensitivity) 8,742 (HH) <18 ng/L    Comment: CRITICAL RESULT CALLED TO, READ BACK BY AND VERIFIED WITH: J COBLE RN [id number] BY A BENNETT (NOTE) Elevated high sensitivity troponin I (hsTnI) values and significant  changes across serial measurements may suggest ACS but many other  chronic and acute conditions are known to elevate hsTnI results.  Refer to the Links section for chest pain algorithms and additional  guidance. Performed at [facility], [place] [address], [place], [place] [address]   Lipase, blood     Status: None   Collection Time: 07/30/20 12:31 PM  Result Value Ref Range   Lipase 42 11 - 51 U/L    Comment: Performed at [facility], [place] [address] [place], [place] [address]  Lactic acid, plasma     Status: Abnormal   Collection Time: 07/30/20  2:11 PM  Result Value Ref Range   Lactic Acid, Venous 2.6 (HH) 0.5 - 1.9 mmol/L    Comment: CRITICAL VALUE NOTED.  VALUE IS CONSISTENT WITH PREVIOUSLY REPORTED AND CALLED VALUE. Performed at [facility], [place] [address], [place], [place] [address]   Troponin I (High Sensitivity)     Status: Abnormal   Collection Time: 07/30/20  2:11 PM  Result Value Ref Range   Troponin I (High Sensitivity) 8,935 (HH) <18 ng/L    Comment: CRITICAL VALUE NOTED.  VALUE IS CONSISTENT WITH PREVIOUSLY REPORTED AND CALLED VALUE. (NOTE) Elevated high sensitivity troponin I (hsTnI) values and significant  changes across serial measurements may suggest ACS but many other  chronic and acute conditions are known to elevate hsTnI results.  Refer to the Links section for chest pain algorithms and additional  guidance. Performed at [facility], [place] [address] [place], [place] [address]   Hepatic function panel     Status: Abnormal   Collection Time: 07/30/20  2:11 PM  Result Value Ref Range   Total Protein 6.2 (L) 6.5 - 8.1 g/dL   Albumin 2.8 (L) 3.5 - 5.0 g/dL   AST 40 15 - 41  U/L   ALT 26 0 - 44 U/L   Alkaline Phosphatase 48 38 - 126 U/L   Total Bilirubin 1.6 (H) 0.3 - 1.2 mg/dL   Bilirubin, Direct 0.4 (H) 0.0 - 0.2 mg/dL   Indirect Bilirubin 1.2 (H) 0.3 - 0.9 mg/dL    Comment: Performed at [facility] [address] [place], [place] [address]  Glucose, capillary     Status: Abnormal   Collection Time: 07/30/20  4:28 PM  Result Value Ref Range   Glucose-Capillary 135 (H) 70 - 99 mg/dL    Comment: Glucose reference range applies only to samples taken after fasting for at least 8 hours.  Glucose, capillary     Status: Abnormal   Collection Time: 07/30/20  9:33 PM  Result Value Ref Range   Glucose-Capillary 104 (H) 70 - 99 mg/dL    Comment: Glucose reference range applies only to samples taken after fasting for at least 8 hours.  Cortisol-am, blood     Status: Abnormal   Collection Time: 07/31/20  3:28 AM  Result Value Ref Range   Cortisol - AM 27.3 (H) 6.7 - 22.6 ug/dL    Comment: Performed at [facility] [address] [place], [place] [address]  Protime-INR     Status: Abnormal  Collection Time: 07/31/20  3:28 AM  Result Value Ref Range   Prothrombin Time 22.1 (H) 11.4 - 15.2 seconds   INR 2.0 (H) 0.8 - 1.2    Comment: (NOTE) INR goal varies based on device and disease states. Performed at [facility], [place] [address], [place], [place] [address]   CBC     Status: Abnormal   Collection Time: 07/31/20  3:28 AM  Result Value Ref Range   WBC 12.3 (H) 4.0 - 10.5 K/uL   RBC 3.95 (L) 4.22 - 5.81 MIL/uL   Hemoglobin 11.4 (L) 13.0 - 17.0 g/dL   HCT 37.8 (L) 39 - 52 %   MCV 95.7 80.0 - 100.0 fL   MCH 28.9 26.0 - 34.0 pg   MCHC 30.2 30.0 - 36.0 g/dL   RDW 18.7 (H) 11.5 - 15.5 %   Platelets 329 150 - 400 K/uL   nRBC 0.0 0.0 - 0.2 %    Comment: Performed at [facility], [place] [address] [place], [place] [address]  Basic metabolic panel     Status: Abnormal   Collection Time: 07/31/20  3:28 AM  Result Value Ref Range    Sodium 136 135 - 145 mmol/L   Potassium 4.9 3.5 - 5.1 mmol/L    Comment: DELTA CHECK NOTED   Chloride 101 98 - 111 mmol/L   CO2 19 (L) 22 - 32 mmol/L   Glucose, Bld 54 (L) 70 - 99 mg/dL    Comment: Glucose reference range applies only to samples taken after fasting for at least 8 hours.   BUN 88 (H) 6 - 20 mg/dL   Creatinine, Ser 10.95 (H) 0.61 - 1.24 mg/dL   Calcium 8.4 (L) 8.9 - 10.3 mg/dL   GFR, Estimated 5 (L) >60 mL/min   Anion gap 16 (H) 5 - 15    Comment: Performed at [facility] [address], [place], [place] [address]  Glucose, capillary     Status: Abnormal   Collection Time: 07/31/20  4:04 AM  Result Value Ref Range   Glucose-Capillary 40 (LL) 70 - 99 mg/dL    Comment: Glucose reference range applies only to samples taken after fasting for at least 8 hours.  I-STAT, chem 8     Status: Abnormal   Collection Time: 07/31/20  4:04 AM  Result Value Ref Range   Sodium 136 135 - 145 mmol/L   Potassium 5.0 3.5 - 5.1 mmol/L   Chloride 102 98 - 111 mmol/L   BUN 90 (H) 6 - 20 mg/dL   Creatinine, Ser 11.70 (H) 0.61 - 1.24 mg/dL   Glucose, Bld 46 (L) 70 - 99 mg/dL    Comment: Glucose reference range applies only to samples taken after fasting for at least 8 hours.   Calcium, Ion 1.13 (L) 1.15 - 1.40 mmol/L   TCO2 23 22 - 32 mmol/L   Hemoglobin 12.2 (L) 13.0 - 17.0 g/dL   HCT 36.0 (L) 39 - 52 %  Glucose, capillary     Status: Abnormal   Collection Time: 07/31/20  4:29 AM  Result Value Ref Range   Glucose-Capillary 124 (H) 70 - 99 mg/dL    Comment: Glucose reference range applies only to samples taken after fasting for at least 8 hours.  Glucose, capillary     Status: None   Collection Time: 07/31/20  6:17 AM  Result Value Ref Range   Glucose-Capillary 79 70 - 99 mg/dL    Comment:  Glucose reference range applies only to samples taken after fasting for at least 8 hours.  Lactic acid, plasma     Status: None   Collection Time: 07/31/20 11:00 AM  Result Value Ref Range    Lactic Acid, Venous 0.8 0.5 - 1.9 mmol/L    Comment: Performed at [facility] [address] [place], [place] [address]  Glucose, capillary     Status: None   Collection Time: 07/31/20 11:03 AM  Result Value Ref Range   Glucose-Capillary 85 70 - 99 mg/dL    Comment: Glucose reference range applies only to samples taken after fasting for at least 8 hours.   *Note: Due to a large number of results and/or encounters for the requested time period, some results have not been displayed. A complete set of results can be found in Results Review.   DG Abd 1 View  Result Date: 07/30/2020 CLINICAL DATA:  Hypotension. EXAM: ABDOMEN - 1 VIEW COMPARISON:  CT 04/11/2018. FINDINGS: Soft tissue structures are unremarkable. Aortoiliac and visceral including bilateral renal atherosclerotic vascular calcification. No bowel distention or free air. Degenerative changes scoliosis lumbar spine. IMPRESSION: 1.  No acute intra-abdominal abnormality.  No bowel distention. 2. Aortoiliac and visceral including bilateral renal atherosclerotic vascular disease. Electronically Signed   By: Marcello Moores  Register   On: 07/30/2020 15:26   DG Chest Port 1 View  Result Date: 07/30/2020 CLINICAL DATA:  Hypotension. EXAM: PORTABLE CHEST 1 VIEW COMPARISON:  July 27, 2020. FINDINGS: Stable cardiomegaly. No pneumothorax or pleural effusion is noted. Lungs are clear. Bony thorax is unremarkable. IMPRESSION: No active disease. Electronically Signed   By: Marijo Conception M.D.   On: 07/30/2020 15:24   US Abdomen Limited RUQ (LIVER/GB)  Result Date: 07/30/2020 CLINICAL DATA:  History of gallstones EXAM: ULTRASOUND ABDOMEN LIMITED RIGHT UPPER QUADRANT COMPARISON:  CT 04/11/2018 FINDINGS: Gallbladder: Small layering stones within the gallbladder. Gallbladder wall is markedly thickened measuring up to 13 mm. Pericholecystic fluid noted around the gallbladder. Tenderness over the gallbladder during the study. Findings compatible with  acute cholecystitis. Common bile duct: Diameter: Not visualized Liver: No focal lesion identified. Within normal limits in parenchymal echogenicity. Portal vein is patent on color Doppler imaging with normal direction of blood flow towards the liver. Other: None. IMPRESSION: Small layering gallstones within the gallbladder. Markedly thickened gallbladder wall with pericholecystic fluid and tenderness over the gallbladder during the study. Findings compatible with acute cholecystitis. Electronically Signed   By: Rolm Baptise M.D.   On: 07/30/2020 18:59   Assessment/Plan COPD ESRD on HD Aortic insufficiency CHF Hx of endocarditis Hx of DVT on warfarin PTSD GERD T2DM Gastroparesis HTN HLD Gout OSA Morbid obesity Secondary hyperparathyroidism Hx of lumbar discitis  NSTEMI - s/p stent  Shock - cardiogenic vs septic vs combined  - off dopamine, still on levo   Possible calculuous cholecystitis  - [place] 10/19 shows cholelithiasis with markedly thickened gallbladder wall and pericholecystic fluid, positive sonographic murphy's sign  - WBC 12.3, afebrile - 10/19 labs: AST/ALT 40/26, Alk Phos 48, Tbili 1.6 (indorect - repeat hepatic function panel today - clinically the patient has a benign exam. he is non-tender. He denies nausea and vomiting during my exam, and denies a history of bilary colic.  - Will order HIDA scan to better evaluate cholecystitis. Given multiple medical problems as above patient is not a surgical candidate and if HIDA is positive he should receive a percutaneous cholecystostomy tube. For this reason, consider stopping warfarin and transitioning  him to a hep gtt, if ok with cardiology.   FEN: renal CM diet  VTE: on warfarin and plavix currently - INR 2.0 this AM ID: Zosyn 10/20>>  Obie Dredge, [facility]

## 2020-07-31 NOTE — Progress Notes (Signed)
Progress Note  Patient Name: [name] [name] Date of Encounter: 07/31/2020  [facility] Cardiologist: Lauree Chandler, MD   Subjective   Lying at 45 degrees in bed.  Denies chest pain.  Some respiratory difficulty/using abdominal muscles to breathe.  Elevated respiratory rate  Did not receive dialysis yesterday.  On Levophed.  Labile heart rates.  Now has central arterial line.  Inpatient Medications    Scheduled Meds: . aspirin  81 mg Oral Daily  . atorvastatin  80 mg Oral Daily  . Chlorhexidine Gluconate Cloth  6 each Topical Daily  . clopidogrel  75 mg Oral Q breakfast  . ferric citrate  210 mg Oral TID WC  . insulin aspart  0-15 Units Subcutaneous TID WC  . insulin aspart  0-5 Units Subcutaneous QHS  . insulin glargine  40 Units Subcutaneous BID  . insulin regular human CONCENTRATED  40 Units Subcutaneous TID WC  . midodrine  5 mg Oral TID WC  .  morphine injection  0.5 mg Intravenous Once  . pantoprazole  40 mg Oral Daily  . patiromer  8.4 g Oral Daily  . sevelamer carbonate  2,400 mg Oral TID WC  . sodium chloride flush  3 mL Intravenous Q12H  . warfarin  5 mg Oral ONCE-1600  . Warfarin - Pharmacist Dosing Inpatient   Does not apply q1600   Continuous Infusions: . sodium chloride    . sodium chloride Stopped (07/31/20 0506)  . sodium chloride    . albumin human    . DOPamine Stopped (07/30/20 1234)  . norepinephrine (LEVOPHED) Adult infusion 6 mcg/min (07/31/20 0700)   PRN Meds: sodium chloride, [CANCELED] Place/Maintain arterial line **AND** sodium chloride, acetaminophen, clonazePAM, sodium chloride flush   Vital Signs    Vitals:   07/31/20 0400 07/31/20 0500 07/31/20 0600 07/31/20 0700  BP:      Pulse: 83  89   Resp: 17 18 (!) 21 18  Temp:    97.7 F (36.5 C)  TempSrc:    Oral  SpO2: 96%  94%   Weight:   134 kg   Height:        Intake/Output Summary (Last 24 hours) at 07/31/2020 0747 Last data filed at 07/31/2020 0700 Gross per  24 hour  Intake 903.68 ml  Output --  Net 903.68 ml   Last 3 Weights 07/31/2020 07/28/2020 07/28/2020  Weight (lbs) 295 lb 6.7 oz 286 lb 13.1 oz 296 lb 1.2 oz  Weight (kg) 134 kg 130.1 kg 134.3 kg  Some encounter information is confidential and restricted. Go to Review Flowsheets activity to see all data.      Telemetry    Atrial flutter with high-grade AV block despite high-dose dopamine.- Personally Reviewed  ECG    N/A  Physical Exam  Morbidly obese.  Using abdominal musculature for breathing. GEN: Pale in appearance Neck:  Unable to assess JVD Cardiac: regular, no murmurs, rubs, or gallops.  Respiratory: Clear to auscultation bilaterally. GI: Soft, nontender, non-distended  MS: No edema; No deformity. Neuro:   Decreased level of consciousness.  Arousable from sleep. Psych: Normal affect   Labs    High Sensitivity Troponin:   Recent Labs  Lab 07/27/20 1122 07/27/20 1320 07/30/20 1231 07/30/20 1411  TROPONINIHS >27,000* >27,000* 8,742* 8,935*      Chemistry Recent Labs  Lab 07/30/20 0504 07/30/20 1150 07/30/20 1411 07/31/20 0328  NA 138 137  --  136  K 5.0 6.2*  --  4.9  CL 97* 100  --  101  CO2 23 24  --  19*  GLUCOSE 96 94  --  54*  BUN 62* 68*  --  88*  CREATININE 9.11* 9.55*  --  10.95*  CALCIUM 9.0 8.0*  --  8.4*  PROT  --   --  6.2*  --   ALBUMIN  --   --  2.8*  --   AST  --   --  40  --   ALT  --   --  26  --   ALKPHOS  --   --  48  --   BILITOT  --   --  1.6*  --   GFRNONAA 6* 5*  --  5*  ANIONGAP 18* 13  --  16*     Hematology Recent Labs  Lab 07/30/20 0504 07/30/20 1150 07/31/20 0328  WBC 17.2* 11.2* 12.3*  RBC 4.49 3.23* 3.95*  HGB 13.1 9.5* 11.4*  HCT 43.3 31.9* 37.8*  MCV 96.4 98.8 95.7  MCH 29.2 29.4 28.9  MCHC 30.3 29.8* 30.2  RDW 19.0* 18.6* 18.7*  PLT 382 286 329    Radiology    DG Abd 1 View  Result Date: 07/30/2020 CLINICAL DATA:  Hypotension. EXAM: ABDOMEN - 1 VIEW COMPARISON:  CT 04/11/2018. FINDINGS: Soft  tissue structures are unremarkable. Aortoiliac and visceral including bilateral renal atherosclerotic vascular calcification. No bowel distention or free air. Degenerative changes scoliosis lumbar spine. IMPRESSION: 1.  No acute intra-abdominal abnormality.  No bowel distention. 2. Aortoiliac and visceral including bilateral renal atherosclerotic vascular disease. Electronically Signed   By: Marcello Moores  Register   On: 07/30/2020 15:26   DG Chest Port 1 View  Result Date: 07/30/2020 CLINICAL DATA:  Hypotension. EXAM: PORTABLE CHEST 1 VIEW COMPARISON:  July 27, 2020. FINDINGS: Stable cardiomegaly. No pneumothorax or pleural effusion is noted. Lungs are clear. Bony thorax is unremarkable. IMPRESSION: No active disease. Electronically Signed   By: Marijo Conception M.D.   On: 07/30/2020 15:24   ECHOCARDIOGRAM COMPLETE  Result Date: 07/29/2020    ECHOCARDIOGRAM REPORT   Patient Name:   [name] [name] Date of Exam: 07/29/2020 Medical Rec #:  [id number]      Height:       70.0 in Accession #:    [id number]     Weight:       286.8 lb Date of Birth:  1966/09/12      BSA:          2.433 m Patient Age:    54 years       BP:           82/55 mmHg Patient Gender: M              HR:           97 bpm. Exam Location:  Inpatient Procedure: 2D Echo Indications:    NSTEMI I21.4  History:        Patient has prior history of Echocardiogram examinations, most                 recent 11/20/2019. CHF, COPD; Risk Factors:Diabetes, Hypertension                 and Dyslipidemia. Endocarditis.  Sonographer:    Jannett Celestine RDCS (AE) Referring Phys: Leon Comments: Patient is morbidly obese. Image acquisition challenging due to patient body habitus. IMPRESSIONS  1. Extremely limited; probable distal septal and apical akinesis with overall mild to moderate LV dysfunction.  2. Left ventricular ejection fraction, by estimation, is 40 to 45%. The left ventricle has mildly decreased function. The left ventricle  demonstrates regional wall motion abnormalities (see scoring diagram/findings for description). There is mild left ventricular hypertrophy. Left ventricular diastolic function could not be evaluated.  3. Right ventricular systolic function is normal. The right ventricular size is normal.  4. The mitral valve is normal in structure. No evidence of mitral valve regurgitation. No evidence of mitral stenosis. Moderate mitral annular calcification.  5. The aortic valve is tricuspid. Aortic valve regurgitation is not visualized. No aortic stenosis is present.  6. The inferior vena cava is dilated in size with <50% respiratory variability, suggesting right atrial pressure of 15 mmHg. FINDINGS  Left Ventricle: Left ventricular ejection fraction, by estimation, is 40 to 45%. The left ventricle has mildly decreased function. The left ventricle demonstrates regional wall motion abnormalities. The left ventricular internal cavity size was normal in size. There is mild left ventricular hypertrophy. Left ventricular diastolic function could not be evaluated due to atrial fibrillation. Left ventricular diastolic function could not be evaluated. Right Ventricle: The right ventricular size is normal.Right ventricular systolic function is normal. Left Atrium: Left atrial size was normal in size. Right Atrium: Right atrial size was normal in size. Pericardium: There is no evidence of pericardial effusion. Mitral Valve: The mitral valve is normal in structure. Moderate mitral annular calcification. No evidence of mitral valve regurgitation. No evidence of mitral valve stenosis. Tricuspid Valve: The tricuspid valve is normal in structure. Tricuspid valve regurgitation is not demonstrated. No evidence of tricuspid stenosis. Aortic Valve: The aortic valve is tricuspid. Aortic valve regurgitation is not visualized. No aortic stenosis is present. Pulmonic Valve: The pulmonic valve was not well visualized. Pulmonic valve regurgitation is not  visualized. No evidence of pulmonic stenosis. Aorta: The aortic root is normal in size and structure. Venous: The inferior vena cava is dilated in size with less than 50% respiratory variability, suggesting right atrial pressure of 15 mmHg.  Additional Comments: Extremely limited; probable distal septal and apical akinesis with overall mild to moderate LV dysfunction.  LEFT VENTRICLE PLAX 2D LVIDd:         4.60 cm LVIDs:         3.50 cm LV PW:         1.20 cm LV IVS:        1.65 cm LVOT diam:     2.10 cm LV SV:         62 LV SV Index:   26 LVOT Area:     3.46 cm  LEFT ATRIUM             Index LA diam:        5.10 cm 2.10 cm/m LA Vol (A2C):   85.5 ml 35.14 ml/m LA Vol (A4C):   61.0 ml 25.07 ml/m LA Biplane Vol: 74.9 ml 30.79 ml/m  AORTIC VALVE LVOT Vmax:   101.00 cm/s LVOT Vmean:  75.500 cm/s LVOT VTI:    0.180 m  AORTA Ao Root diam: 3.60 cm MITRAL VALVE MV Area (PHT): 4.49 cm     SHUNTS MV Decel Time: 169 msec     Systemic VTI:  0.18 m MV E velocity: 131.00 cm/s  Systemic Diam: 2.10 cm Kirk Ruths MD Electronically signed by Kirk Ruths MD Signature Date/Time: 07/29/2020/12:27:11 PM    Final    US Abdomen Limited RUQ (LIVER/GB)  Result Date: 07/30/2020 CLINICAL DATA:  History of gallstones EXAM: ULTRASOUND ABDOMEN  LIMITED RIGHT UPPER QUADRANT COMPARISON:  CT 04/11/2018 FINDINGS: Gallbladder: Small layering stones within the gallbladder. Gallbladder wall is markedly thickened measuring up to 13 mm. Pericholecystic fluid noted around the gallbladder. Tenderness over the gallbladder during the study. Findings compatible with acute cholecystitis. Common bile duct: Diameter: Not visualized Liver: No focal lesion identified. Within normal limits in parenchymal echogenicity. Portal vein is patent on color Doppler imaging with normal direction of blood flow towards the liver. Other: None. IMPRESSION: Small layering gallstones within the gallbladder. Markedly thickened gallbladder wall with pericholecystic  fluid and tenderness over the gallbladder during the study. Findings compatible with acute cholecystitis. Electronically Signed   By: Rolm Baptise M.D.   On: 07/30/2020 18:59    Cardiac Studies   CORONARY STENT INTERVENTION  LEFT HEART CATH AND CORONARY ANGIOGRAPHY  Conclusion   1. Severe proximal to mid LAD stenosis 2. Chronic occlusion of the distal intermediate branch, small in caliber 3. Complete occlusion of the distal segment of the first obtuse marginal branch. Cannot exclude that this was the infarct vessel but the patient was pain free and this vessel did not appear large enough for PCI. There is collateral filling of lateral wall branches from the RCA 4. Successful PTCA/DES x 1 proximal to mid LAD  Recommendations: Will admit to the ICU. Will continue DAPT with ASA and Plavix. Resume coumadin prior to discharge. I would plan to use triple therapy with ASA, Plavix and coumadin for one month then stop ASA. Continue statin. Will not start a beta blocker given his soft BP. Echo tomorrow.         Patient Profile     54 y.o. male  with hx of ESRD on dialysis, COPD, AI, LBBB, OSA, DM II, chronic atrial flutter, chronic anticoagulation, chronic DVT,  chronic diastolic HF, history of endocarditis, presents with chest pain - found to have NSTEMI --> LAD stent.  Subsequent profound hypotension and bradycardia after dialysis, leading to IV pressor therapy  Assessment & Plan    1. Hypotension: Currently has left radial art line.  On 6 mics of Levophed.  Will increase midodrine oral dose and attempt to wean Levophed. 2. Coronary atherosclerotic heart disease with non-ST elevation MI: Treated with LAD stent.  3. Acute systolic and diastolic heart failure: Volume control per dialysis.  Not able to dialyze over the past 48 hours due to hypotension.   4. Atrial flutter: He is not a component for pacing. 5. END-stage kidney disease: Being followed by nephrology.  Dialysis when possible.  He  will need a higher dry weight to optimize LV function and support blood pressure.  PROGNOSIS: Prognosis is guarded given low blood pressures, diffuse vascular disease with recent MI, and chronic hypotension.   We need to establish CODE STATUS and will need the help of nephrology relative to long-term prognosis.  For questions or updates, please contact [place] Please consult [email or web address] for contact info under        Signed, Sinclair Grooms, MD  07/31/2020, 7:47 AM

## 2020-07-31 NOTE — Telephone Encounter (Signed)
Patient calling in with questions about operation that was done on 07/27/2020 by Dr. Angelena Form.

## 2020-07-31 NOTE — Progress Notes (Signed)
NAMEDenzil [name], MRN:  [id number], DOB:  1966/04/13, LOS: 4 ADMISSION DATE:  07/27/2020, CONSULTATION DATE:  07/31/20 REFERRING MD:  Jonnie Finner, CHIEF COMPLAINT:  shock   Brief History   54 y.o. M with PMH ESRD admitted with chest pain and NSTEMI, developed nausea, vomiting, hypotension and bradycardia and was placed on Dopamine and transferred to ICU.   History of present illness   [name] [name] is a 54 y.o.M with PMH significant for ESRD, heart failure, DM who presented on 10/16 with chest pain.  He was found to have A-fib and NSTEMI and taken for cath which showed severe proximal to mid-LAD stenosis s/p DESx1 and EF of 35-40%.   He developed hypotension and Bradycardia on 10/18 thought to be multi-factorial including autonomic dysfunction, volume depletion and vascular issues making measurment difficult.  He was initially given 500cc IVF and then started on Dopamine.  This had to maximized, so patient was transferred to the ICU and PCCM consulted.   Pt is awake and alert and states that he has been nauseated with vomiting for the last day.  He denies abdominal pain, but later c/o epigastric discomfort.   No chest pain, hypoxia or dyspnea.  He feels constipated and last BM was four days ago.   Past Medical History   has a past medical history of Allergic rhinitis, Allergy, Anemia, Aortic insufficiency, Bilateral carpal tunnel syndrome, Cataract, CHF (congestive heart failure) ([place]), Claustrophobia, COPD (chronic obstructive pulmonary disease) ([place]), Diabetes mellitus, Diabetic peripheral neuropathy ([place]), Diabetic retinopathy ([place]), Discitis of lumbar region, DVT (deep venous thrombosis) ([place]) (10/07/2015), Endocarditis (11/28/14), ESRD (end stage renal disease) on dialysis [facility]), Gait disorder, Gastroparesis, GERD (gastroesophageal reflux disease), Gout, Hearing difficulty, Hemodialysis patient ([place]), Hyperlipidemia, Hypertension, Incomplete bladder emptying, Leg pain (02/05/11), Morbid obesity ([place]),  Obstructive sleep apnea, Osteomyelitis ([place]), Posttraumatic stress disorder, Rotator cuff disorder, Secondary hyperparathyroidism ([place]), Sepsis ([place]), Sleep apnea, Urethral stricture, and Vitamin D deficiency.  Significant Hospital Events   10/16 Admit to cardiology 10/18 Hypotension and ICU txfr 10/19 PCCM consult  Consults:  Nephrology PCCM  Procedures:    Significant Diagnostic Tests:  10/16 Cardiac Cath >>Severe proximal to mid LAD stenosis 10/19 Echo EF 40-45%, mild LVH 10/16 LHC 07/27/2020  Lat Ramus lesion is 100% stenosed.  1st Mrg lesion is 100% stenosed.  Prox LAD lesion is 99% stenosed.  A drug-eluting stent was successfully placed using a STENT RESOLUTE ONYX 3.5X18. Post intervention, there is a 0% residual stenosis.  Micro Data:  10/16 Covid-19 and flu>>negative  Antimicrobials:    Interim history/subjective:  Sitting up in bed states he feels well with no acute complaints apart from mild constipation  Remains on IV levophed  Does not seem to understand gravity of critical status.   Objective   Blood pressure 99/84, pulse 62, temperature 97.7 F (36.5 C), temperature source Oral, resp. rate 15, height 5\' 10"  (1.778 m), weight 134 kg, SpO2 100 %.        Intake/Output Summary (Last 24 hours) at 07/31/2020 1002 Last data filed at 07/31/2020 0800 Gross per 24 hour  Intake 824.92 ml  Output --  Net 824.92 ml   Filed Weights   07/28/20 1220 07/28/20 1630 07/31/20 0600  Weight: 134.3 kg 130.1 kg 134 kg   Physical exam General: Acute on chronically ill appearing elderly male sitting up in bed in NAD HEENT: [place]/AT, MM pink/moist, PERRL,  Neuro: Alert and oriented to self and place but confused to time and situation. Dose not seem to  understand critical status. He is focused on past medical issues and current constipation  CV: s1s2 regular rate and rhythm, no murmur, rubs, or gallops,  PULM:  Clear to ascultation bilateral, no increased work of  breathing, no added breath sounds GI: soft, bowel sounds active in all 4 quadrants, non-tender, non-distended Extremities: warm/dry, no edema  Skin: no rashes or lesions  Resolved Hospital Problem list     Assessment & Plan:   CAD S/P NSTEMI  -A drug-eluting stent was successfully placed 10/16 to LAD Combined systolic and diastolic CHF -Echo EF 85-88%, mild LVH P: Primary management per cardiology Trend troponin Continuous telemetry   Persistent atypical appearing atrial flutter with slow ventricular rate and AV block  -Per chart review he has had episodes of A-fib as far back as 2017, not on AV nodal blocker at baseline due to hypotension  -Patient continues to display sever ectopy on bedside monitor with some still ectopy with only P wave spike seen.  Hx of LLE DVT P: Electrophysiology following  Very concerning for development of lethal rhythm Again patient does not seem to grasp extent of his medical issues   Continue Coumadin  Question possible need for PPM No an ablation candidate   Hypotension and severe bradycardia  -Requiring ICU transfer and vasopressors P: Continue pressor support   Concern for underlying shock state possible septic shock  -Source of his shock is not entirely clear, possibly mild cardiogenic shock in the setting of NSTEMI.  He has been afebrile and WBC is down-trending however his ABD ultrasound with findings compatible with acute cholecystitis P: Lactic remains elevated at 2.6, repeat now  Begin empiric Zosyn  No abdominal tenderness on exam  CXR negative  Continue pressure support as above  Obtain blood cultures   Acute Cholecystitis  Nausea, vomiting, abdominal pain -Pt also has history of gallstones on prior CT and signs of acute cholecystitis on ABD ultrasound Possibly worsened by Dopamine, pt c/o constipation and may be developing ileus.  Lipase normal.  P: Being Zosyn as above  Will consult GI and possible surgery for possible need  of intervention including drain  LFT remain stable   ESRD P: Nephrology following  Plan for iHD this AM but this may exacerbate his cardiac ectopy Follow renal function / urine output Trend Bmet Avoid nephrotoxins Ensure adequate renal perfusion   H/o lumbar discitis 2016 -> Enterococcus bacteremia -> endocarditis 2016 -Echocardiogram at that time demonstrated AV endocarditis with severe aortic insuff. Treated with IV ABX. Pt "declined" surgery per the notes and didn't appear to follow up with [facility] surgeons.Was seen by [facility] ID and blood cultutes (02/27/2015 showed no growth) P: Obtain blood cultures  Empiric antibiotics as above   Best practice:  Diet: as tolerated Pain/Anxiety/Delirium protocol (if indicated): n/a VAP protocol (if indicated): N/A DVT prophylaxis: warfarin GI prophylaxis: Protonix Glucose control: SSI, lantus Mobility: bed rest Code Status: full code Family Communication: per primary  Disposition: ICU  Labs   CBC: Recent Labs  Lab 07/27/20 1122 07/27/20 1122 07/28/20 0121 07/30/20 0504 07/30/20 1150 07/31/20 0328 07/31/20 0404  WBC 8.4  --  8.4 17.2* 11.2* 12.3*  --   HGB 12.0*   < > 11.3* 13.1 9.5* 11.4* 12.2*  HCT 39.9   < > 36.1* 43.3 31.9* 37.8* 36.0*  MCV 98.5  --  95.0 96.4 98.8 95.7  --   PLT 247  --  240 382 286 329  --    < > = values in  this interval not displayed.    Basic Metabolic Panel: Recent Labs  Lab 07/27/20 1122 07/27/20 1122 07/28/20 0121 07/28/20 1126 07/30/20 0504 07/30/20 1150 07/31/20 0328 07/31/20 0404  NA 136   < > 132*  --  138 137 136 136  K 4.3   < > 5.1  --  5.0 6.2* 4.9 5.0  CL 91*   < > 92*  --  97* 100 101 102  CO2 30  --  26  --  23 24 19*  --   GLUCOSE 148*   < > 330*  --  96 94 54* 46*  BUN 49*   < > 57*  --  62* 68* 88* 90*  CREATININE 9.20*   < > 10.21*  --  9.11* 9.55* 10.95* 11.70*  CALCIUM 8.8*  --  8.4*  --  9.0 8.0* 8.4*  --   MG  --   --   --  2.4 2.3  --   --   --   PHOS  --   --   --   --   5.7*  --   --   --    < > = values in this interval not displayed.   GFR: Estimated Creatinine Clearance: 9.7 mL/min (A) (by C-G formula based on SCr of 11.7 mg/dL (H)). Recent Labs  Lab 07/28/20 0121 07/30/20 0504 07/30/20 1144 07/30/20 1150 07/30/20 1411 07/31/20 0328  WBC 8.4 17.2*  --  11.2*  --  12.3*  LATICACIDVEN  --   --  3.6*  --  2.6*  --     Liver Function Tests: Recent Labs  Lab 07/30/20 1411  AST 40  ALT 26  ALKPHOS 48  BILITOT 1.6*  PROT 6.2*  ALBUMIN 2.8*   Recent Labs  Lab 07/30/20 1231  LIPASE 42   No results for input(s): AMMONIA in the last 168 hours.  ABG    Component Value Date/Time   PHART 7.301 (L) 10/14/2015 1821   PCO2ART 55.1 (H) 10/14/2015 1821   PO2ART 100.0 10/14/2015 1821   HCO3 27.2 (H) 10/14/2015 1821   TCO2 23 07/31/2020 0404   ACIDBASEDEF 4.6 (H) 10/13/2015 1420   O2SAT 97.0 10/14/2015 1821     Coagulation Profile: Recent Labs  Lab 07/27/20 1320 07/30/20 0504 07/31/20 0328  INR 1.8* 1.4* 2.0*    Cardiac Enzymes: No results for input(s): CKTOTAL, CKMB, CKMBINDEX, TROPONINI in the last 168 hours.  HbA1C: Hgb A1c MFr Bld  Date/Time Value Ref Range Status  07/28/2020 01:21 AM 6.8 (H) 4.8 - 5.6 % Final    Comment:    (NOTE)         Prediabetes: 5.7 - 6.4         Diabetes: >6.4         Glycemic control for adults with diabetes: <7.0   05/13/2020 11:01 AM 7.5 (H) 4.6 - 6.5 % Final    Comment:    Glycemic Control Guidelines for People with Diabetes:Non Diabetic:  <6%Goal of Therapy: <7%Additional Action Suggested:  >8%     CBG: Recent Labs  Lab 07/30/20 1628 07/30/20 2133 07/31/20 0404 07/31/20 0429 07/31/20 0617  GLUCAP 135* 104* 40* 124* 79    Critical care time:  CRITICAL CARE Performed by: Johnsie Cancel  Total critical care time: 40 minutes  Critical care time was exclusive of separately billable procedures and treating other patients.  Critical care was necessary to treat or prevent imminent  or life-threatening deterioration.  Critical  care was time spent personally by me on the following activities: development of treatment plan with patient and/or surrogate as well as nursing, discussions with consultants, evaluation of patient's response to treatment, examination of patient, obtaining history from patient or surrogate, ordering and performing treatments and interventions, ordering and review of laboratory studies, ordering and review of radiographic studies, pulse oximetry and re-evaluation of patient's condition.  Mickel Fuchs

## 2020-07-31 NOTE — Progress Notes (Signed)
[facility] Progress Note   Subjective:  Seen in room, a-line placed and cuff pressures and a-lines were not aligned.  A-line pressures are 20-50 pts higher on the systolic. Pt in bed, no c/o today, feels better, nausea better.   Objective Vitals:   07/31/20 0500 07/31/20 0600 07/31/20 0700 07/31/20 0800  BP:      Pulse:  89  62  Resp: 18 (!) 21 18 15   Temp:   97.7 F (36.5 C)   TempSrc:   Oral   SpO2:  94%  100%  Weight:  134 kg    Height:       Physical Exam General: Chronically ill appearing man, NAD. Nasal O2. Heart: RRR; no murmur Lungs: CTA anteriorly and post, no rales or wheezing Abdomen: soft, non-tender Extremities: No LE edema Dialysis Access: RUE AVG + bruit  Additional Objective Labs: Basic Metabolic Panel: Recent Labs  Lab 07/30/20 0504 07/30/20 0504 07/30/20 1150 07/31/20 0328 07/31/20 0404  NA 138   < > 137 136 136  K 5.0   < > 6.2* 4.9 5.0  CL 97*   < > 100 101 102  CO2 23  --  24 19*  --   GLUCOSE 96   < > 94 54* 46*  BUN 62*   < > 68* 88* 90*  CREATININE 9.11*   < > 9.55* 10.95* 11.70*  CALCIUM 9.0  --  8.0* 8.4*  --   PHOS 5.7*  --   --   --   --    < > = values in this interval not displayed.   CBC: Recent Labs  Lab 07/27/20 1122 07/27/20 1122 07/28/20 0121 07/28/20 0121 07/30/20 0504 07/30/20 0504 07/30/20 1150 07/31/20 0328 07/31/20 0404  WBC 8.4   < > 8.4   < > 17.2*  --  11.2* 12.3*  --   HGB 12.0*   < > 11.3*   < > 13.1   < > 9.5* 11.4* 12.2*  HCT 39.9   < > 36.1*   < > 43.3   < > 31.9* 37.8* 36.0*  MCV 98.5  --  95.0  --  96.4  --  98.8 95.7  --   PLT 247   < > 240   < > 382  --  286 329  --    < > = values in this interval not displayed.  Medications:  sodium chloride     sodium chloride Stopped (07/31/20 0506)   sodium chloride     albumin human     DOPamine Stopped (07/30/20 1234)   norepinephrine (LEVOPHED) Adult infusion 6 mcg/min (07/31/20 0800)    aspirin  81 mg Oral Daily    atorvastatin  80 mg Oral Daily   Chlorhexidine Gluconate Cloth  6 each Topical Daily   clopidogrel  75 mg Oral Q breakfast   ferric citrate  210 mg Oral TID WC   insulin aspart  0-15 Units Subcutaneous TID WC   insulin aspart  0-5 Units Subcutaneous QHS   insulin glargine  40 Units Subcutaneous BID   insulin regular human CONCENTRATED  40 Units Subcutaneous TID WC   midodrine  10 mg Oral TID WC    morphine injection  0.5 mg Intravenous Once   pantoprazole  40 mg Oral Daily   patiromer  8.4 g Oral Daily   sevelamer carbonate  2,400 mg Oral TID WC   sodium chloride flush  3 mL Intravenous Q12H   warfarin  5 mg Oral ONCE-1600   Warfarin - Pharmacist Dosing Inpatient   Does not apply q1600    Home meds:  - auryxia 1 ac tid/ phoslo 3 ac tid/ renvela 3 ac tid  - lokelma 10 gm qd/  veltassa 8.4gm qd  - crestor 20 hs  - insulin toujeo 80u bid sq/ regular insulin 50- 70u sq tid ac  - protonix 40 qd/ xyloprim 100 tid  - clonazepam 0.5 mg bid prn  - warfarin qd as directed  - prn's/ vitamins/ supplements    Dialysis Orders: TTS - [facility] 4.5h   450/A1.5   129kg   1K/2.25Ca bath   AVG   Hep none  - Hectoral 43mcg Iv q HD - No ESA recently   CXR 10/16 - IMPRESSION: Opacity left base concerning for potential pneumonia. Lungs otherwise clear. There is cardiac enlargement. No adenopathy appreciable.    Assessment/Plan: 1. Hypotension/ severe bradycardia - bradycardia is better, not sure but may have temp pacer on. Seen by EP and PPM felt to be too high risk (hx infection, esrd, comorbidities) and pt does not want PPM either. BP's are much better w/ a-line, suspect cuff pressures not entirely accurate. Is on levo gtt at 6 ug/min now. BP's 120/70.  2. Atrial flutter - w/ slow VR. Heart rate better today, has temp pacer in place.  3. NSTEMI/CAD: s/p LHC with DES placement to proximal LAD 10/16. Had 100% occlusion of 2 smaller branches as well - no intervention options. On DAPT +  warfarin.  4. ESRD/ hx failed renal Tx 2009 - long-term esrd pt. Dialyzed here Sunday 10/17 w/ 3L off.  Appears more stable today, will plan HD next pt this am.   5. HyPOTN/volume: Chronic hypotension, worse here. Cont midodrine 10 tid. Wt's up, no signs of vol overload.  6. Anemia: Hgb 11.3 - no ESA needed for now. 7. Secondary hyperparathyroidism: Ca ok, Phos pending. Continue Auryxia as binder. 8. T2DM: Insulin per primary. 9. Prognosis: potentially will at high-risk for HD complications w/ bradycardia w/ or w/o hypotension. Will see how pt does w/ HD today. Will discuss goals of care w/ pt also.    Kelly Splinter, MD 07/31/2020, 9:33 AM

## 2020-07-31 NOTE — Progress Notes (Signed)
[place] for warfarin Indication: atrial fibrillation  Allergies  Allergen Reactions  . Pork-Derived Products Anaphylaxis and Other (See Comments)    Fever also   . Ace Inhibitors Cough  . Hydrocodone Other (See Comments)    Hallucinations  . Pork Allergy     Other reaction(s): Unknown    Patient Measurements: Height: _0  (177.8 cm) Weight: 134 kg (295 lb 6.7 oz) IBW/kg (Calculated) : 73  Vital Signs: Temp: 97.7 F (36.5 C) (10/20 0700) Temp Source: Oral (10/20 0700) Pulse Rate: 62 (10/20 0800)  Labs: Recent Labs    07/30/20 0504 07/30/20 0504 07/30/20 1150 07/30/20 1150 07/30/20 1231 07/30/20 1411 07/31/20 0328 07/31/20 0404  HGB 13.1   < > 9.5*   < >  --   --  11.4* 12.2*  HCT 43.3   < > 31.9*  --   --   --  37.8* 36.0*  PLT 382  --  286  --   --   --  329  --   LABPROT 16.2*  --   --   --   --   --  22.1*  --   INR 1.4*  --   --   --   --   --  2.0*  --   CREATININE 9.11*   < > 9.55*  --   --   --  10.95* 11.70*  TROPONINIHS  --   --   --   --  7,096* 2,836*  --   --    < > = values in this interval not displayed.    Estimated Creatinine Clearance: 9.7 mL/min (A) (by C-G formula based on SCr of 11.7 mg/dL (H)).   Medical History: Past Medical History:  Diagnosis Date  . Allergic rhinitis   . Allergy   . Anemia   . Aortic insufficiency    a. Echo 7/16:  Mod LVH, EF 50-55%, mild to mod AI, severe LAE, PASP 57 mmHg (LV ID end diastolic 62.9 mm);  b. Echo 2/17:  Mod LVH, EF 50-55%, mod AI, MAC, mild MR, mod LAE, mild to mod TR, PASP 63 mmHg  . Bilateral carpal tunnel syndrome   . Cataract    removed both eyes   . CHF (congestive heart failure) ([place])   . Claustrophobia   . COPD (chronic obstructive pulmonary disease) ([place])   . Diabetes mellitus    Type II  . Diabetic peripheral neuropathy ([place])   . Diabetic retinopathy ([place])   . Discitis of lumbar region    resolved  . DVT (deep venous thrombosis) ([place])  10/07/2015   LLE  . Endocarditis 11/28/14  . ESRD (end stage renal disease) on dialysis [facility])    s/p renal transplant in 2009, failed in 2016  . Gait disorder   . Gastroparesis   . GERD (gastroesophageal reflux disease)   . Gout   . Hearing difficulty   . Hemodialysis patient ([place])    tuesday, thursday, saturday   . Hyperlipidemia   . Hypertension   . Incomplete bladder emptying   . Leg pain 02/05/11   with walking  . Morbid obesity ([place])   . Obstructive sleep apnea    not using CPAP- lost 100lbs  . Osteomyelitis ([place])   . Posttraumatic stress disorder   . Rotator cuff disorder   . Secondary hyperparathyroidism ([place])   . Sepsis ([place])    Postive blood cultures -   . Sleep apnea    no  cpap  . Urethral stricture   . Vitamin D deficiency     Medications:  Medications Prior to Admission  Medication Sig Dispense Refill Last Dose  . Accu-Chek FastClix Lancets MISC USE TO CHECK BLOOD SUGARS 4 TIMES A DAY. (Patient taking differently: 1 each by Other route See admin instructions. USE TO CHECK BLOOD SUGARS 4 TIMES A DAY.) 360 each 4 07/27/2020 at Unknown time  . allopurinol (ZYLOPRIM) 100 MG tablet Take 1 tablet (100 mg total) by mouth 3 (three) times a week. After dialysis 30 tablet 6 Past Week at Unknown time  . AURYXIA 1 GM 210 MG(Fe) tablet Take 210 mg by mouth 3 (three) times daily with meals.    07/26/2020 at Unknown time  . calcium acetate (PHOSLO) 667 MG capsule Take 3 capsules (2,001 mg total) by mouth 3 (three) times daily with meals. 270 capsule 0 07/26/2020 at Unknown time  . clonazePAM (KLONOPIN) 0.5 MG tablet Take 1 tablet (0.5 mg total) by mouth 2 (two) times daily as needed for anxiety. 60 tablet 0 07/26/2020 at Unknown time  . DROPLET PEN NEEDLES 32G X 4 MM MISC USE  TO INJECT FIVE TIMES DAILY AS DIRECTED (Patient taking differently: 1 each by Other route See admin instructions. Use to inject 5 times daily as directed.) 500 each 1 07/26/2020 at Unknown time  . glucose  blood (ACCU-CHEK GUIDE) test strip 1 each by Other route as needed for other. Use as instructed to check blood sugar 3 times daily. DX:E11.65 300 each 1 07/27/2020 at Unknown time  . hydrocortisone 1 % ointment Apply 1 application topically 2 (two) times daily. 30 g 0 Past Week at Unknown time  . hydrocortisone-pramoxine (PROCTOFOAM-HC) rectal foam Place 1 applicator rectally 2 (two) times daily. 30 g 0 Past Week at Unknown time  . Insulin Pen Needle 32G X 8 MM MISC Use with insulin x5 times a day (Patient taking differently: 1 each by Other route See admin instructions. Use with insulin x5 times a day) 500 each 1 07/26/2020 at Unknown time  . insulin regular human CONCENTRATED (HUMULIN R U-500 KWIKPEN) 500 UNIT/ML kwikpen INJECT 50 UNITS UNDER THE SKIN AT BREAKFAST, 70 UNITS AT LUNCH, AND 60 UNITS AT DINNER. (Patient taking differently: Inject 50-70 Units into the skin See admin instructions. INJECT 50 UNITS UNDER THE SKIN AT BREAKFAST, 70 UNITS AT LUNCH, AND 60 UNITS AT DINNER.) 12 mL 0 07/26/2020 at Unknown time  . omeprazole (PRILOSEC) 20 MG capsule Take 1 capsule (20 mg total) by mouth daily. Annual appt due in Oct must see provider for future refills 90 capsule 1 07/26/2020 at Unknown time  . RENVELA 800 MG tablet Take 2,400 mg by mouth 3 (three) times daily with meals.    07/26/2020 at Unknown time  . rosuvastatin (CRESTOR) 20 MG tablet Take 1 tablet (20 mg total) by mouth at bedtime. Annual appt due in Oct must see provider for future refills 90 tablet 1 07/26/2020 at Unknown time  . TOUJEO MAX SOLOSTAR 300 UNIT/ML Solostar Pen INJECT 80 UNITS UNDER THE SKIN IN THE MORNING AND 80 UNITS IN THE EVENING (Patient taking differently: Inject 80 Units into the skin See admin instructions. INJECT 80 UNITS UNDER THE SKIN IN THE MORNING AND 80 UNITS IN THE EVENING) 48 mL 1 07/26/2020 at Unknown time  . VELTASSA 8.4 g packet Take 8.4 g by mouth daily.    07/26/2020 at Unknown time  . warfarin (COUMADIN) 5 MG  tablet TAKE 1 TABLET EVERY  DAY OR AS DIRECTED BY COUMADIN CLINIC. (Patient taking differently: Take 5 mg by mouth See admin instructions. TAKE 1 TABLET EVERY DAY OR AS DIRECTED BY COUMADIN CLINIC.) 100 tablet 0 07/26/2020 at Unknown time  . busPIRone (BUSPAR) 5 MG tablet TAKE 1 TABLET (5 MG TOTAL) BY MOUTH 2 (TWO) TIMES DAILY AS NEEDED. (Patient not taking: Reported on 07/27/2020) 180 tablet 1 Not Taking at Unknown time  . loperamide (IMODIUM) 2 MG capsule Take 1 capsule (2 mg total) by mouth as needed for diarrhea or loose stools. (Patient not taking: Reported on 07/27/2020) 30 capsule 0 Not Taking at Unknown time  . mupirocin ointment (BACTROBAN) 2 % Apply 1 application topically daily. Apply to dorsal foot wound daily (Patient not taking: Reported on 07/27/2020) 30 g 2 Not Taking at Unknown time  . sodium zirconium cyclosilicate (LOKELMA) 10 g PACK packet Take 10 g by mouth daily.    unk   Scheduled:  . aspirin  81 mg Oral Daily  . atorvastatin  80 mg Oral Daily  . Chlorhexidine Gluconate Cloth  6 each Topical Daily  . clopidogrel  75 mg Oral Q breakfast  . ferric citrate  210 mg Oral TID WC  . insulin aspart  0-15 Units Subcutaneous TID WC  . insulin aspart  0-5 Units Subcutaneous QHS  . insulin glargine  40 Units Subcutaneous BID  . insulin regular human CONCENTRATED  40 Units Subcutaneous TID WC  . midodrine  10 mg Oral TID WC  .  morphine injection  0.5 mg Intravenous Once  . pantoprazole  40 mg Oral Daily  . patiromer  8.4 g Oral Daily  . sevelamer carbonate  2,400 mg Oral TID WC  . sodium chloride flush  3 mL Intravenous Q12H  . warfarin  5 mg Oral ONCE-1600  . Warfarin - Pharmacist Dosing Inpatient   Does not apply q1600    Assessment: 55 yo male with NSTEMI s/p cath with DES. He is on coumadin PTA for afib and pharmacy consulted to restart. Plans noted to drop ASA in 2-4 weeks, for now continues on ASA and Plavix.   Today, his CBC remains wnl with Hgb 12.2 and plt 329. INR  just therapeutic at 2. Warfarin in was resumed 10/18, but 10/19 dose was not given. NP unsure why it was not given yesterday, no reasons documented. No bleeding noted. Will continue with current regimen at this time.   -INR= 1.8 > 1.4 > 2   -Doses: 5 mg x1 then missed next dose.   Home dose: 69m/d  Goal of Therapy:  INR 2-3 Monitor platelets by anticoagulation protocol: Yes   Plan:  -Warfarin 580mpo again today -Daily PT/INR -Daily CBC  ThClaudina LickPharmD PGY1 Acute Care Pharmacy Resident  07/31/2020 10:06 AM  Please check [email or web address] for unit-specific pharmacy phone numbers.

## 2020-07-31 NOTE — Progress Notes (Signed)
Pharmacy Antibiotic Note  [name] [name] is a 54 y.o. male admitted on 07/27/2020 with chest pain and NSTEMI, now concerned for acute cholecystitis. Pharmacy has been consulted for Zosyn dosing.  Today, patient is afebrile but with elevated WBC at 12.3 and lactic acid at 2.6. He continues to require vasopressor support as well. An abdominal ultrasound today shows findings compatible with acute cholecystitis. Patient is ESRD, typically on iHD but has been off schedule this week due to blood pressure concerns. Nephrology plans to continue attempting HD as able. Plan to start empiric antibiotics at this time while awaiting GI consult and possible need for surgical intervention.   Plan: Zosyn 2.25g IV q8h  Monitor fevers, WBC, Lactic acid, clinical progress F/u cultures, sensitivities, LOT  Height: 5\' 10"  (177.8 cm) Weight: 134 kg (295 lb 6.7 oz) IBW/kg (Calculated) : 73  Temp (24hrs), Avg:97.8 F (36.6 C), Min:97.7 F (36.5 C), Max:98 F (36.7 C)  Recent Labs  Lab 07/27/20 1122 07/27/20 1122 07/28/20 0121 07/30/20 0504 07/30/20 1144 07/30/20 1150 07/30/20 1411 07/31/20 0328 07/31/20 0404 07/31/20 1100  WBC 8.4  --  8.4 17.2*  --  11.2*  --  12.3*  --   --   CREATININE 9.20*   < > 10.21* 9.11*  --  9.55*  --  10.95* 11.70*  --   LATICACIDVEN  --   --   --   --  3.6*  --  2.6*  --   --  0.8   < > = values in this interval not displayed.    Estimated Creatinine Clearance: 9.7 mL/min (A) (by C-G formula based on SCr of 11.7 mg/dL (H)).    Allergies  Allergen Reactions  . Pork-Derived Products Anaphylaxis and Other (See Comments)    Fever also   . Ace Inhibitors Cough  . Hydrocodone Other (See Comments)    Hallucinations  . Pork Allergy     Other reaction(s): Unknown    Antimicrobials this admission: Zosyn 10/20 >>   Microbiology results: 10/20 Blood cx x2 >> sent   Thank you for allowing pharmacy to be a part of this patient's care.  Claudina Lick, PharmD PGY1 Acute  Care Pharmacy Resident 07/31/2020 12:14 PM  Please check [email or web address] for unit-specific pharmacy phone numbers.

## 2020-07-31 NOTE — Telephone Encounter (Signed)
Left message to call back  

## 2020-07-31 NOTE — Significant Event (Signed)
Late entry for 10/18-2244.Marland Kitchen  Pt. with change in MS, HR-40s Aflutter, SBP 60s...MD notified with order for 1mg  Atropine to be given and he will be at bedside ASAP. Atropine given with fluids started. MD arrives with order for Dopamine at 25mcq. Charge nurse as well as RRT arrive at bedside. Pt. Is sitting up heaving. Pt. Answers questions and follows commands. Pt. To be transferred to [facility]. Will continue to monitor closely. Orders given and carried out.

## 2020-08-01 ENCOUNTER — Inpatient Hospital Stay (HOSPITAL_COMMUNITY): Payer: Medicare HMO

## 2020-08-01 DIAGNOSIS — N186 End stage renal disease: Secondary | ICD-10-CM | POA: Diagnosis not present

## 2020-08-01 DIAGNOSIS — I213 ST elevation (STEMI) myocardial infarction of unspecified site: Secondary | ICD-10-CM | POA: Diagnosis not present

## 2020-08-01 DIAGNOSIS — I5021 Acute systolic (congestive) heart failure: Secondary | ICD-10-CM | POA: Diagnosis not present

## 2020-08-01 DIAGNOSIS — I214 Non-ST elevation (NSTEMI) myocardial infarction: Secondary | ICD-10-CM | POA: Diagnosis not present

## 2020-08-01 DIAGNOSIS — I4819 Other persistent atrial fibrillation: Secondary | ICD-10-CM | POA: Diagnosis not present

## 2020-08-01 LAB — CBC
HCT: 36 % — ABNORMAL LOW (ref 39.0–52.0)
HCT: 34.4 % — ABNORMAL LOW (ref 39.0–52.0)
Hemoglobin: 10.8 g/dL — ABNORMAL LOW (ref 13.0–17.0)
Hemoglobin: 10.4 g/dL — ABNORMAL LOW (ref 13.0–17.0)
MCH: 29.1 pg (ref 26.0–34.0)
MCH: 29.4 pg (ref 26.0–34.0)
MCHC: 30 g/dL (ref 30.0–36.0)
MCHC: 30.2 g/dL (ref 30.0–36.0)
MCV: 97 fL (ref 80.0–100.0)
MCV: 97.2 fL (ref 80.0–100.0)
Platelets: 229 10*3/uL (ref 150–400)
Platelets: 281 10*3/uL (ref 150–400)
RBC: 3.71 MIL/uL — ABNORMAL LOW (ref 4.22–5.81)
RBC: 3.54 MIL/uL — ABNORMAL LOW (ref 4.22–5.81)
RDW: 18.6 % — ABNORMAL HIGH (ref 11.5–15.5)
RDW: 18.5 % — ABNORMAL HIGH (ref 11.5–15.5)
WBC: 10.6 10*3/uL — ABNORMAL HIGH (ref 4.0–10.5)
WBC: 7.8 10*3/uL (ref 4.0–10.5)
nRBC: 0 % (ref 0.0–0.2)
nRBC: 0 % (ref 0.0–0.2)

## 2020-08-01 LAB — RENAL FUNCTION PANEL
Albumin: 2.9 g/dL — ABNORMAL LOW (ref 3.5–5.0)
Albumin: 2.6 g/dL — ABNORMAL LOW (ref 3.5–5.0)
Anion gap: 14 (ref 5–15)
Anion gap: 15 (ref 5–15)
BUN: 53 mg/dL — ABNORMAL HIGH (ref 6–20)
BUN: 60 mg/dL — ABNORMAL HIGH (ref 6–20)
CO2: 24 mmol/L (ref 22–32)
CO2: 23 mmol/L (ref 22–32)
Calcium: 8.2 mg/dL — ABNORMAL LOW (ref 8.9–10.3)
Calcium: 8.2 mg/dL — ABNORMAL LOW (ref 8.9–10.3)
Chloride: 99 mmol/L (ref 98–111)
Chloride: 98 mmol/L (ref 98–111)
Creatinine, Ser: 7.96 mg/dL — ABNORMAL HIGH (ref 0.61–1.24)
Creatinine, Ser: 9.04 mg/dL — ABNORMAL HIGH (ref 0.61–1.24)
GFR, Estimated: 7 mL/min — ABNORMAL LOW (ref >60)
GFR, Estimated: 6 mL/min — ABNORMAL LOW (ref >60)
Glucose, Bld: 34 mg/dL — CL (ref 70–99)
Glucose, Bld: 129 mg/dL — ABNORMAL HIGH (ref 70–99)
Phosphorus: 5.5 mg/dL — ABNORMAL HIGH (ref 2.5–4.6)
Phosphorus: 5.6 mg/dL — ABNORMAL HIGH (ref 2.5–4.6)
Potassium: 4.5 mmol/L (ref 3.5–5.1)
Potassium: 4.4 mmol/L (ref 3.5–5.1)
Sodium: 137 mmol/L (ref 135–145)
Sodium: 136 mmol/L (ref 135–145)

## 2020-08-01 LAB — GLUCOSE, CAPILLARY
Glucose-Capillary: 42 mg/dL — CL (ref 70–99)
Glucose-Capillary: 109 mg/dL — ABNORMAL HIGH (ref 70–99)
Glucose-Capillary: 80 mg/dL (ref 70–99)
Glucose-Capillary: 75 mg/dL (ref 70–99)
Glucose-Capillary: 92 mg/dL (ref 70–99)
Glucose-Capillary: 140 mg/dL — ABNORMAL HIGH (ref 70–99)
Glucose-Capillary: 56 mg/dL — ABNORMAL LOW (ref 70–99)
Glucose-Capillary: 61 mg/dL — ABNORMAL LOW (ref 70–99)
Glucose-Capillary: 63 mg/dL — ABNORMAL LOW (ref 70–99)
Glucose-Capillary: 90 mg/dL (ref 70–99)

## 2020-08-01 LAB — PROTIME-INR
INR: 2.1 — ABNORMAL HIGH (ref 0.8–1.2)
Prothrombin Time: 22.5 seconds — ABNORMAL HIGH (ref 11.4–15.2)

## 2020-08-01 MED ORDER — SODIUM CHLORIDE 0.9 % IV SOLN: 100.0000 mL | INTRAVENOUS | Status: DC | PRN

## 2020-08-01 MED ORDER — LIDOCAINE-PRILOCAINE 2.5-2.5 % EX CREA
1.0000 "application " | TOPICAL_CREAM | CUTANEOUS | Status: DC | PRN
  Filled 2020-08-01: qty 5

## 2020-08-01 MED ORDER — PENTAFLUOROPROP-TETRAFLUOROETH EX AERO: 1.0000 "application " | INHALATION_SPRAY | CUTANEOUS | Status: DC | PRN

## 2020-08-01 MED ORDER — INSULIN REGULAR HUMAN (CONC) 500 UNIT/ML ~~LOC~~ SOPN: 15.0000 [IU] | PEN_INJECTOR | Freq: Three times a day (TID) | SUBCUTANEOUS | Status: DC

## 2020-08-01 MED ORDER — TECHNETIUM TC 99M MEBROFENIN IV KIT
5.0000 | PACK | Freq: Once | INTRAVENOUS | Status: AC | PRN
  Administered 2020-08-01: 5 via INTRAVENOUS

## 2020-08-01 MED ORDER — LIDOCAINE-PRILOCAINE 2.5-2.5 % EX CREA: 1.0000 "application " | TOPICAL_CREAM | CUTANEOUS | Status: DC | PRN

## 2020-08-01 MED ORDER — DEXTROSE 10 % IV SOLN: INTRAVENOUS | Status: AC

## 2020-08-01 MED ORDER — DEXTROSE 50 % IV SOLN
INTRAVENOUS | Status: AC
  Filled 2020-08-01: qty 50

## 2020-08-01 MED ORDER — LIDOCAINE HCL (PF) 1 % IJ SOLN: 5.0000 mL | INTRAMUSCULAR | Status: DC | PRN

## 2020-08-01 MED ORDER — DEXTROSE 50 % IV SOLN
25.0000 g | INTRAVENOUS | Status: AC
  Administered 2020-08-01: 25 g via INTRAVENOUS

## 2020-08-01 MED ORDER — GLUCOSE 4 G PO CHEW
CHEWABLE_TABLET | ORAL | Status: AC
  Administered 2020-08-01: 4 g
  Filled 2020-08-01: qty 1

## 2020-08-01 NOTE — Progress Notes (Signed)
[facility] Progress Note   Subjective:  Seen in room. No c/o today, wants to eat. No SOB, feeling "heavy" from IV fluids.   Objective Vitals:   08/01/20 1000 08/01/20 1015 08/01/20 1030 08/01/20 1045  BP:      Pulse: 93 91 (!) 105 96  Resp: 14 16 18 16   Temp:      TempSrc:      SpO2: 93% 91% 90% 95%  Weight:      Height:       Physical Exam General: Chronically ill appearing man, NAD. Nasal O2. Heart: RRR; no murmur Lungs: CTA anteriorly and post, no rales or wheezing Abdomen: soft, non-tender Extremities: No LE edema Dialysis Access: RUE AVG + bruit  Additional Objective Labs: Basic Metabolic Panel: Recent Labs  Lab 07/30/20 0504 07/30/20 0504 07/30/20 1150 07/30/20 1150 07/31/20 0328 07/31/20 0404 08/01/20 0030  NA 138   < > 137   < > 136 136 137  K 5.0   < > 6.2*   < > 4.9 5.0 4.5  CL 97*   < > 100   < > 101 102 99  CO2 23   < > 24  --  19*  --  24  GLUCOSE 96   < > 94   < > 54* 46* 34*  BUN 62*   < > 68*   < > 88* 90* 53*  CREATININE 9.11*   < > 9.55*   < > 10.95* 11.70* 7.96*  CALCIUM 9.0   < > 8.0*  --  8.4*  --  8.2*  PHOS 5.7*  --   --   --   --   --  5.5*   < > = values in this interval not displayed.   CBC: Recent Labs  Lab 07/28/20 0121 07/28/20 0121 07/30/20 0504 07/30/20 0504 07/30/20 1150 07/30/20 1150 07/31/20 0328 07/31/20 0404 08/01/20 0030  WBC 8.4   < > 17.2*   < > 11.2*  --  12.3*  --  10.6*  HGB 11.3*   < > 13.1   < > 9.5*   < > 11.4* 12.2* 10.8*  HCT 36.1*   < > 43.3   < > 31.9*   < > 37.8* 36.0* 36.0*  MCV 95.0  --  96.4  --  98.8  --  95.7  --  97.0  PLT 240   < > 382   < > 286  --  329  --  229   < > = values in this interval not displayed.  Medications: . sodium chloride    . sodium chloride Stopped (08/01/20 0641)  . sodium chloride    . albumin human    . norepinephrine (LEVOPHED) Adult infusion 5 mcg/min (08/01/20 0352)  . piperacillin-tazobactam (ZOSYN)  IV 100 mL/hr at 08/01/20 0646   . aspirin   81 mg Oral Daily  . atorvastatin  80 mg Oral Daily  . Chlorhexidine Gluconate Cloth  6 each Topical Daily  . clopidogrel  75 mg Oral Q breakfast  . dextrose      . ferric citrate  210 mg Oral TID WC  . insulin aspart  0-15 Units Subcutaneous TID WC  . insulin aspart  0-5 Units Subcutaneous QHS  . insulin glargine  20 Units Subcutaneous BID  . insulin regular human CONCENTRATED  15 Units Subcutaneous TID WC  . midodrine  10 mg Oral TID WC  .  morphine injection  0.5 mg Intravenous Once  .  pantoprazole  40 mg Oral Daily  . patiromer  8.4 g Oral Daily  . sevelamer carbonate  2,400 mg Oral TID WC  . sodium chloride flush  3 mL Intravenous Q12H    Home meds:  - auryxia 1 ac tid/ phoslo 3 ac tid/ renvela 3 ac tid  - lokelma 10 gm qd/  veltassa 8.4gm qd  - crestor 20 hs  - insulin toujeo 80u bid sq/ regular insulin 50- 70u sq tid ac  - protonix 40 qd/ xyloprim 100 tid  - clonazepam 0.5 mg bid prn  - warfarin qd as directed  - prn's/ vitamins/ supplements    Dialysis Orders: TTS - [facility] 4.5h   450/A1.5   129kg   1K/2.25Ca bath   AVG   Hep none  - Hectoral 41mcg Iv q HD - No ESA recently   CXR 10/16 - IMPRESSION: Opacity left base concerning for potential pneumonia. Lungs otherwise clear. There is cardiac enlargement. No adenopathy appreciable.    Assessment/Plan: 1. Hypotension - much better, still on low dose pressors. Possibly sepsis from GB infection causing shock. A-line pressures do not correlate w/ cuff pressures. Wean pressors as tol. Getting IV abx  2. R/O acute cholecystitis - +US findings, gen surgery consulting.  3. Atrial flutter / bradycardia - low HR's resolved, has temp pacer in place prn but not using. HR 80-90's today.  4. NSTEMI/CAD: s/p LHC with DES placement to proximal LAD 10/16. Had 100% occlusion of 2 smaller branches as well - no intervention options. On DAPT + warfarin. Warfarin on hold in case procedures needed.  5. ESRD/ hx failed renal Tx 2009 -  long-term esrd pt. Dialyzed here Sunday 10/17 w/ 3L off.  Had short HD yest, plan regular HD today to get back on schedule.   6. Chronic hypotension - midodrine 10 tid added here 7. Volume - is up several kg, UF 1-2 L today as tol 8. Anemia: Hgb 11.3 - no ESA needed for now. 9. Secondary hyperparathyroidism: Ca ok, Phos pending. Continue Auryxia as binder. 10. T2DM: Insulin per primary. 60. GOC: d/w patient his overall condition and he does not seem to understand that things were/ are very serious. I tried to convey this to him. Will consult pall care team as well for assistance.    Kelly Splinter, MD 08/01/2020, 10:56 AM

## 2020-08-01 NOTE — Plan of Care (Signed)
  Problem: Education: Goal: Understanding of CV disease, CV risk reduction, and recovery process will improve Outcome: Progressing   Problem: Health Behavior/Discharge Planning: Goal: Ability to safely manage health-related needs after discharge will improve Outcome: Progressing

## 2020-08-01 NOTE — Progress Notes (Signed)
[facility] Progress Note  5 Days Post-Op  Subjective: CC:  Reports mild abdominal pain in his left hemiabdomen only when he coughs. Denies epigastric pain, RUQ pain, nausea, or vomiting.  Objective: Vital signs in last 24 hours: Temp:  [97.6 F (36.4 C)-98.2 F (36.8 C)] 98.2 F (36.8 C) (10/21 1230) Pulse Rate:  [82-105] 91 (10/21 1430) Resp:  [11-31] 15 (10/21 1430) SpO2:  [90 %-100 %] 94 % (10/21 1430) Arterial Line BP: (90-176)/(29-58) 91/31 (10/21 1430) Weight:  [133.1 kg-133.4 kg] 133.4 kg (10/21 1230) Last BM Date: 07/28/20  Intake/Output from previous day: 10/20 0701 - 10/21 0700 In: 1571.5 [P.O.:730; I.V.:712; IV Piggyback:129.5] Out: 0  Intake/Output this shift: Total I/O In: 163 [P.O.:60; I.V.:103] Out: -   PE: Gen:  Alert, NAD, pleasant Card: sinus tachycardia Pulm:  Normal effort Abd: Soft, obese, non-tender, reducible umbilical hernia Skin: warm and dry, no rashes  Psych: A&Ox3   Lab Results:  Recent Labs    08/01/20 0030 08/01/20 1230  WBC 10.6* 7.8  HGB 10.8* 10.4*  HCT 36.0* 34.4*  PLT 229 281   BMET Recent Labs    08/01/20 0030 08/01/20 1230  NA 137 136  K 4.5 4.4  CL 99 98  CO2 24 23  GLUCOSE 34* 129*  BUN 53* 60*  CREATININE 7.96* 9.04*  CALCIUM 8.2* 8.2*   PT/INR Recent Labs    07/31/20 0328 08/01/20 0030  LABPROT 22.1* 22.5*  INR 2.0* 2.1*   CMP     Component Value Date/Time   NA 136 08/01/2020 1230   K 4.4 08/01/2020 1230   CL 98 08/01/2020 1230   CO2 23 08/01/2020 1230   GLUCOSE 129 (H) 08/01/2020 1230   BUN 60 (H) 08/01/2020 1230   CREATININE 9.04 (H) 08/01/2020 1230   CALCIUM 8.2 (L) 08/01/2020 1230   PROT 6.2 (L) 07/30/2020 1411   ALBUMIN 2.6 (L) 08/01/2020 1230   AST 40 07/30/2020 1411   ALT 26 07/30/2020 1411   ALKPHOS 48 07/30/2020 1411   BILITOT 1.6 (H) 07/30/2020 1411   GFRNONAA 6 (L) 08/01/2020 1230   GFRAA 4 (L) 08/01/2019 0746   Lipase     Component Value Date/Time   LIPASE 42  07/30/2020 1231       Studies/Results: DG Abd 1 View  Result Date: 07/30/2020 CLINICAL DATA:  Hypotension. EXAM: ABDOMEN - 1 VIEW COMPARISON:  CT 04/11/2018. FINDINGS: Soft tissue structures are unremarkable. Aortoiliac and visceral including bilateral renal atherosclerotic vascular calcification. No bowel distention or free air. Degenerative changes scoliosis lumbar spine. IMPRESSION: 1.  No acute intra-abdominal abnormality.  No bowel distention. 2. Aortoiliac and visceral including bilateral renal atherosclerotic vascular disease. Electronically Signed   By: Marcello Moores  Register   On: 07/30/2020 15:26   NM Hepato W/EF  Result Date: 08/01/2020 CLINICAL DATA:  Cholelithiasis EXAM: NUCLEAR MEDICINE HEPATOBILIARY IMAGING WITH GALLBLADDER EF TECHNIQUE: Sequential images of the abdomen were obtained out to 60 minutes following intravenous administration of radiopharmaceutical. After oral ingestion of Ensure, gallbladder ejection fraction was determined. At 60 min, normal ejection fraction is greater than 33%. RADIOPHARMACEUTICALS:  5.15 mCi Tc-28m Choletec IV COMPARISON:  None FINDINGS: Normal tracer extraction from bloodstream indicating normal hepatocellular function. Normal excretion of tracer into biliary tree. Gallbladder visualized at 27 min. Small bowel visualized at 24 min. No hepatic retention of tracer. Patient motion during second half of exam. Subjectively decrease of tracer from gallbladder following fatty meal stimulation. Calculated gallbladder ejection fraction is 14%, abnormally low.  Patient reported no symptoms following Ensure ingestion. Normal gallbladder ejection fraction following Ensure ingestion is greater than 33% at 1 hour. IMPRESSION: Patent biliary tree. Abnormal gallbladder response to fatty meal stimulation with a decreased gallbladder ejection fraction of 14%. Electronically Signed   By: Lavonia Dana M.D.   On: 08/01/2020 11:30   DG Chest Port 1 View  Result Date:  07/30/2020 CLINICAL DATA:  Hypotension. EXAM: PORTABLE CHEST 1 VIEW COMPARISON:  July 27, 2020. FINDINGS: Stable cardiomegaly. No pneumothorax or pleural effusion is noted. Lungs are clear. Bony thorax is unremarkable. IMPRESSION: No active disease. Electronically Signed   By: Marijo Conception M.D.   On: 07/30/2020 15:24   US Abdomen Limited RUQ (LIVER/GB)  Result Date: 07/30/2020 CLINICAL DATA:  History of gallstones EXAM: ULTRASOUND ABDOMEN LIMITED RIGHT UPPER QUADRANT COMPARISON:  CT 04/11/2018 FINDINGS: Gallbladder: Small layering stones within the gallbladder. Gallbladder wall is markedly thickened measuring up to 13 mm. Pericholecystic fluid noted around the gallbladder. Tenderness over the gallbladder during the study. Findings compatible with acute cholecystitis. Common bile duct: Diameter: Not visualized Liver: No focal lesion identified. Within normal limits in parenchymal echogenicity. Portal vein is patent on color Doppler imaging with normal direction of blood flow towards the liver. Other: None. IMPRESSION: Small layering gallstones within the gallbladder. Markedly thickened gallbladder wall with pericholecystic fluid and tenderness over the gallbladder during the study. Findings compatible with acute cholecystitis. Electronically Signed   By: Rolm Baptise M.D.   On: 07/30/2020 18:59    Anti-infectives: Anti-infectives (From admission, onward)   Start     Dose/Rate Route Frequency Ordered Stop   07/31/20 1400  piperacillin-tazobactam (ZOSYN) IVPB 3.375 g  Status:  Discontinued        3.375 g 100 mL/hr over 30 Minutes Intravenous Every 8 hours 07/31/20 1131 07/31/20 1133   07/31/20 1230  piperacillin-tazobactam (ZOSYN) IVPB 2.25 g        2.25 g 100 mL/hr over 30 Minutes Intravenous Every 8 hours 07/31/20 1136       Assessment/Plan COPD ESRD on HD Aortic insufficiency CHF Hx of endocarditis Hx of DVT on warfarin PTSD GERD T2DM Gastroparesis HTN HLD Gout OSA Morbid  obesity Secondary hyperparathyroidism Hx of lumbar discitis  NSTEMI - s/p stent  Shock - cardiogenic vs septic vs combined  - off dopamine, still on levo   Possible calculuous cholecystitis  - [place] 10/19 shows cholelithiasis with markedly thickened gallbladder wall and pericholecystic fluid, positive sonographic murphy's sign  - WBC WNL, afebrile - 10/19 labs: AST/ALT 40/26, Alk Phos 48, Tbili 1.6 (indirect > direct)  - HIDA 10/21 negative for cholecystitis - patient has a patent cystic duct, decreased GB EF 14% - no acute surgical needs, general surgery will sign off, call as needed   FEN: would recommend a heart healthy/low fat diet, fluid restriction per renal  VTE: on warfarin and plavix currently - INR 2.0 this AM ID: Zosyn 10/20>>   LOS: 5 days    Obie Dredge, [facility] Please see Amion for pager number during day hours 7:00am-4:30pm

## 2020-08-01 NOTE — Progress Notes (Addendum)
Progress Note  Patient Name: [name] [name] Date of Encounter: 08/01/2020  [facility] Cardiologist: Lauree Chandler, MD   Subjective   Initially out of the room when attempted to round.  Returned while on the unit.  Feels much better today.  Denies nausea vomiting.  He believes he will be dialyzed again today.  General surgery consult concerning abdominal symptoms, raising the question of gallbladder disease.  Inpatient Medications    Scheduled Meds: . aspirin  81 mg Oral Daily  . atorvastatin  80 mg Oral Daily  . Chlorhexidine Gluconate Cloth  6 each Topical Daily  . clopidogrel  75 mg Oral Q breakfast  . dextrose      . ferric citrate  210 mg Oral TID WC  . insulin aspart  0-15 Units Subcutaneous TID WC  . insulin aspart  0-5 Units Subcutaneous QHS  . insulin glargine  20 Units Subcutaneous BID  . insulin regular human CONCENTRATED  15 Units Subcutaneous TID WC  . midodrine  10 mg Oral TID WC  .  morphine injection  0.5 mg Intravenous Once  . pantoprazole  40 mg Oral Daily  . patiromer  8.4 g Oral Daily  . sevelamer carbonate  2,400 mg Oral TID WC  . sodium chloride flush  3 mL Intravenous Q12H   Continuous Infusions: . sodium chloride    . sodium chloride Stopped (08/01/20 0641)  . sodium chloride    . albumin human    . norepinephrine (LEVOPHED) Adult infusion 5 mcg/min (08/01/20 0352)  . piperacillin-tazobactam (ZOSYN)  IV 100 mL/hr at 08/01/20 0646   PRN Meds: sodium chloride, [CANCELED] Place/Maintain arterial line **AND** sodium chloride, acetaminophen, clonazePAM, sodium chloride flush   Vital Signs    Vitals:   08/01/20 0930 08/01/20 0945 08/01/20 1000 08/01/20 1015  BP:      Pulse:  91 93 91  Resp: (!) 26 14 14 16   Temp:      TempSrc:      SpO2:  96% 93% 91%  Weight:      Height:        Intake/Output Summary (Last 24 hours) at 08/01/2020 1030 Last data filed at 08/01/2020 1000 Gross per 24 hour  Intake 1365.27 ml  Output 0 ml  Net  1365.27 ml   Last 3 Weights 08/01/2020 07/31/2020 07/31/2020  Weight (lbs) 293 lb 6.9 oz 293 lb 14 oz 294 lb 15.6 oz  Weight (kg) 133.1 kg 133.3 kg 133.8 kg  Some encounter information is confidential and restricted. Go to Review Flowsheets activity to see all data.      Telemetry    Atrial flutter with generally improved heart rates over the past 18 hours.- Personally Reviewed  ECG    N/A  Physical Exam  Comfortable and engaging GEN: Skin color appears more normal Neck:  No obvious JVD Cardiac: Irregular heart rhythm.  Respiratory: Clear lung fields by anterior auscultation GI:  The abdomen is nontender MS: No edema; No deformity. Neuro:   Decreased level of consciousness.  Arousable from sleep. Psych: Normal affect   Labs    High Sensitivity Troponin:   Recent Labs  Lab 07/27/20 1122 07/27/20 1320 07/30/20 1231 07/30/20 1411  TROPONINIHS >27,000* >27,000* 8,742* 8,935*      Chemistry Recent Labs  Lab 07/30/20 1150 07/30/20 1150 07/30/20 1411 07/31/20 0328 07/31/20 0404 08/01/20 0030  NA 137   < >  --  136 136 137  K 6.2*   < >  --  4.9  5.0 4.5  CL 100   < >  --  101 102 99  CO2 24  --   --  19*  --  24  GLUCOSE 94   < >  --  54* 46* 34*  BUN 68*   < >  --  88* 90* 53*  CREATININE 9.55*   < >  --  10.95* 11.70* 7.96*  CALCIUM 8.0*  --   --  8.4*  --  8.2*  PROT  --   --  6.2*  --   --   --   ALBUMIN  --   --  2.8*  --   --  2.9*  AST  --   --  40  --   --   --   ALT  --   --  26  --   --   --   ALKPHOS  --   --  48  --   --   --   BILITOT  --   --  1.6*  --   --   --   GFRNONAA 5*  --   --  5*  --  7*  ANIONGAP 13  --   --  16*  --  14   < > = values in this interval not displayed.     Hematology Recent Labs  Lab 07/30/20 1150 07/30/20 1150 07/31/20 0328 07/31/20 0404 08/01/20 0030  WBC 11.2*  --  12.3*  --  10.6*  RBC 3.23*  --  3.95*  --  3.71*  HGB 9.5*   < > 11.4* 12.2* 10.8*  HCT 31.9*   < > 37.8* 36.0* 36.0*  MCV 98.8  --  95.7  --   97.0  MCH 29.4  --  28.9  --  29.1  MCHC 29.8*  --  30.2  --  30.0  RDW 18.6*  --  18.7*  --  18.6*  PLT 286  --  329  --  229   < > = values in this interval not displayed.    Radiology    DG Abd 1 View  Result Date: 07/30/2020 CLINICAL DATA:  Hypotension. EXAM: ABDOMEN - 1 VIEW COMPARISON:  CT 04/11/2018. FINDINGS: Soft tissue structures are unremarkable. Aortoiliac and visceral including bilateral renal atherosclerotic vascular calcification. No bowel distention or free air. Degenerative changes scoliosis lumbar spine. IMPRESSION: 1.  No acute intra-abdominal abnormality.  No bowel distention. 2. Aortoiliac and visceral including bilateral renal atherosclerotic vascular disease. Electronically Signed   By: Marcello Moores  Register   On: 07/30/2020 15:26   DG Chest Port 1 View  Result Date: 07/30/2020 CLINICAL DATA:  Hypotension. EXAM: PORTABLE CHEST 1 VIEW COMPARISON:  July 27, 2020. FINDINGS: Stable cardiomegaly. No pneumothorax or pleural effusion is noted. Lungs are clear. Bony thorax is unremarkable. IMPRESSION: No active disease. Electronically Signed   By: Marijo Conception M.D.   On: 07/30/2020 15:24   US Abdomen Limited RUQ (LIVER/GB)  Result Date: 07/30/2020 CLINICAL DATA:  History of gallstones EXAM: ULTRASOUND ABDOMEN LIMITED RIGHT UPPER QUADRANT COMPARISON:  CT 04/11/2018 FINDINGS: Gallbladder: Small layering stones within the gallbladder. Gallbladder wall is markedly thickened measuring up to 13 mm. Pericholecystic fluid noted around the gallbladder. Tenderness over the gallbladder during the study. Findings compatible with acute cholecystitis. Common bile duct: Diameter: Not visualized Liver: No focal lesion identified. Within normal limits in parenchymal echogenicity. Portal vein is patent on color Doppler imaging with normal direction of blood flow towards  the liver. Other: None. IMPRESSION: Small layering gallstones within the gallbladder. Markedly thickened gallbladder wall with  pericholecystic fluid and tenderness over the gallbladder during the study. Findings compatible with acute cholecystitis. Electronically Signed   By: Rolm Baptise M.D.   On: 07/30/2020 18:59    Cardiac Studies   CORONARY STENT INTERVENTION  LEFT HEART CATH AND CORONARY ANGIOGRAPHY  Conclusion   1. Severe proximal to mid LAD stenosis 2. Chronic occlusion of the distal intermediate branch, small in caliber 3. Complete occlusion of the distal segment of the first obtuse marginal branch. Cannot exclude that this was the infarct vessel but the patient was pain free and this vessel did not appear large enough for PCI. There is collateral filling of lateral wall branches from the RCA 4. Successful PTCA/DES x 1 proximal to mid LAD  Recommendations: Will admit to the ICU. Will continue DAPT with ASA and Plavix. Resume coumadin prior to discharge. I would plan to use triple therapy with ASA, Plavix and coumadin for one month then stop ASA. Continue statin. Will not start a beta blocker given his soft BP. Echo tomorrow.         Patient Profile     54 y.o. male  with hx of ESRD on dialysis, COPD, AI, LBBB, OSA, DM II, chronic atrial flutter, chronic anticoagulation, chronic DVT,  chronic diastolic HF, history of endocarditis, presents with chest pain - found to have NSTEMI --> LAD stent.  Subsequent profound hypotension and bradycardia after dialysis, leading to IV pressor therapy  Assessment & Plan    1. Hypotension: Improved now on midodrine.  Plan to wean and DC Levaquin today. 2. Coronary atherosclerotic heart disease with non-ST elevation MI: No recurrent angina.  Continue aspirin and 3. Acute systolic and diastolic heart failure: Dialyzed yesterday.  All things considered, he did relatively well. 4. Atrial flutter: He will not receive pacemaker therapy.  He has been seen by EP.  Heart rates are little better now that nausea is improved.  Chronic anticoagulation for stroke prophylaxis is  being held because of concern about the patient's abdomen and the possibility of needing surgery. 5. END-stage kidney disease: Dialyzed yesterday.  Further management per nephrology. 6. Chronic oral anticoagulation: Indication atrial flutter and chronic DVT.  PROGNOSIS: He has a very guarded prognosis.  We had a conversation yesterday concerning limitations to our cardiovascular care relative to revascularization, advanced heart failure therapies, and electrical device implantation.  Luckily he is improved today.  He has not voiced any opinion concerning CODE STATUS.  This has been difficult for him to comprehend.  For questions or updates, please contact [place] Please consult [email or web address] for contact info under        Signed, Sinclair Grooms, MD  08/01/2020, 10:30 AM

## 2020-08-01 NOTE — Progress Notes (Signed)
Inpatient Diabetes Program Recommendations  AACE/ADA: New Consensus Statement on Inpatient Glycemic Control (2015)  Target Ranges:  Prepandial:   less than 140 mg/dL      Peak postprandial:   less than 180 mg/dL (1-2 hours)      Critically ill patients:  140 - 180 mg/dL   Lab Results  Component Value Date   GLUCAP 75 08/01/2020   HGBA1C 6.8 (H) 07/28/2020    Review of Glycemic Control Results for [name] [name], [name] [name] (MRN [id number]) as of 08/01/2020 09:28  Ref. Range 07/31/2020 11:03 07/31/2020 16:03 07/31/2020 20:40 07/31/2020 22:14 07/31/2020 22:45 08/01/2020 00:40 08/01/2020 01:38 08/01/2020 04:19 08/01/2020 06:43  Glucose-Capillary Latest Ref Range: 70 - 99 mg/dL 85 111 (H) 64 (L) 55 (L) 91 42 (LL) 109 (H) 80 75   Diabetes history: DM 2 Outpatient Diabetes medications:  U500 50 units with breakfast, 70 units with lunch, and 60 units with dinner Toujeo 80 units bid Current orders for Inpatient glycemic control:  Novolog moderate tid with meals and HS Lantus 20 units bid U500 15 units tid Inpatient Diabetes Program Recommendations:   Patient has not received any insulin since 10/19- Blood sugars remain low.  Consider holding both Lantus and U500 insulin until blood sugar consistently>200 mg/dL.    Thanks, Adah Perl, RN, BC-ADM Inpatient Diabetes Coordinator Pager [phone] (8a-5p)

## 2020-08-01 NOTE — Progress Notes (Signed)
Report received from Kyung Bacca, RN. Patient off unit getting HIDA scan.

## 2020-08-01 NOTE — Progress Notes (Signed)
NAMEKeneth [name], MRN:  [id number], DOB:  Apr 19, 1966, LOS: 5 ADMISSION DATE:  07/27/2020, CONSULTATION DATE:  08/01/20 REFERRING MD:  Jonnie Finner, CHIEF COMPLAINT:  shock   Brief History   54 y.o. M with PMH ESRD admitted with chest pain and NSTEMI, developed nausea, vomiting, hypotension and bradycardia and was placed on Dopamine and transferred to ICU.   History of present illness   [name] [name] is a 54 y.o.M with PMH significant for ESRD, heart failure, DM who presented on 10/16 with chest pain.  He was found to have A-fib and NSTEMI and taken for cath which showed severe proximal to mid-LAD stenosis s/p DESx1 and EF of 35-40%.   He developed hypotension and Bradycardia on 10/18 thought to be multi-factorial including autonomic dysfunction, volume depletion and vascular issues making measurment difficult.  He was initially given 500cc IVF and then started on Dopamine.  This had to maximized, so patient was transferred to the ICU and PCCM consulted.   Pt is awake and alert and states that he has been nauseated with vomiting for the last day.  He denies abdominal pain, but later c/o epigastric discomfort.   No chest pain, hypoxia or dyspnea.  He feels constipated and last BM was four days ago.   Past Medical History   has a past medical history of Allergic rhinitis, Allergy, Anemia, Aortic insufficiency, Bilateral carpal tunnel syndrome, Cataract, CHF (congestive heart failure) ([place]), Claustrophobia, COPD (chronic obstructive pulmonary disease) ([place]), Diabetes mellitus, Diabetic peripheral neuropathy ([place]), Diabetic retinopathy ([place]), Discitis of lumbar region, DVT (deep venous thrombosis) ([place]) (10/07/2015), Endocarditis (11/28/14), ESRD (end stage renal disease) on dialysis [facility]), Gait disorder, Gastroparesis, GERD (gastroesophageal reflux disease), Gout, Hearing difficulty, Hemodialysis patient ([place]), Hyperlipidemia, Hypertension, Incomplete bladder emptying, Leg pain (02/05/11), Morbid obesity ([place]),  Obstructive sleep apnea, Osteomyelitis ([place]), Posttraumatic stress disorder, Rotator cuff disorder, Secondary hyperparathyroidism ([place]), Sepsis ([place]), Sleep apnea, Urethral stricture, and Vitamin D deficiency.  Significant Hospital Events   10/16 Admit to cardiology 10/18 Hypotension and ICU txfr 10/19 PCCM consult  Consults:  Nephrology PCCM  Procedures:    Significant Diagnostic Tests:  10/16 Cardiac Cath >>Severe proximal to mid LAD stenosis 10/19 Echo EF 40-45%, mild LVH 10/19 RUQ u/s: Small layering gallstones within the gallbladder. Markedly thickened gallbladder wall with pericholecystic fluid and tenderness over the gallbladder during the study. Findings compatible with acute Cholecystitis. 10/16 LHC 07/27/2020  Lat Ramus lesion is 100% stenosed.  1st Mrg lesion is 100% stenosed.  Prox LAD lesion is 99% stenosed.  A drug-eluting stent was successfully placed using a STENT RESOLUTE ONYX 3.5X18. Post intervention, there is a 0% residual stenosis.  10/20 HIDA: pending   Micro Data:  10/16 Covid-19 and flu>>negative 10/20 blood cx: ngtd Antimicrobials:  Zosyn 10/20->  Interim history/subjective:  10/21: started on zosyn yesterday and HIDA completed (awaiting final read). Afebrile overnight  Objective   Blood pressure 99/84, pulse 86, temperature 97.6 F (36.4 C), temperature source Oral, resp. rate 15, height 5\' 10"  (1.778 m), weight 133.1 kg, SpO2 100 %.        Intake/Output Summary (Last 24 hours) at 08/01/2020 0854 Last data filed at 08/01/2020 0300 Gross per 24 hour  Intake 927.44 ml  Output 0 ml  Net 927.44 ml   Filed Weights   07/31/20 1230 07/31/20 1545 08/01/20 0642  Weight: 133.8 kg 133.3 kg 133.1 kg   Physical exam General: Acute on chronically ill appearing elderly male sitting up in bed in NAD just arrived back from  HIDA HEENT: [place]/AT, MM pink/moist, PERRL,  Neuro: Alert and oriented to self and place, lacks great insight to disease  process CV: s1s2 regular rate and rhythm, no murmur, rubs, or gallops,  PULM:  Clear to ascultation bilateral, no increased work of breathing, no added breath sounds GI: obese,soft, bowel sounds active in all 4 quadrants, non-tender, non-distended Extremities: warm/dry, no edema  Skin: no rashes or lesions  Resolved Hospital Problem list     Assessment & Plan:   CAD S/P NSTEMI  -A drug-eluting stent was successfully placed 10/16 to LAD -plavix remains on, coumadin has been stopped should his INR get <2 would start heparin as we await definitive gb intervention recommendations ( HIDA pending) Combined systolic and diastolic CHF -Echo EF 24-23%, mild LVH P: Primary management per cardiology Continuous telemetry   Persistent atypical appearing atrial flutter with slow ventricular rate and AV block  -Per chart review he has had episodes of A-fib/flutter as far back as 2017, not on AV nodal blocker at baseline due to hypotension  -intermittent ectopy. No noted pauses this morning.  -not on ac at this time 2/2 potential need for surgery vs drain for gb  Hx of LLE DVT P: -cards -patient does not seem to grasp extent of his medical issues   Continue Coumadin when able -no ppm planned with EP  Hypotension and severe bradycardia  -Requiring ICU transfer and vasopressors P: Continue pressor support   Concern for underlying shock state possible septic shock  -Source of his shock is not entirely clear, possibly mild cardiogenic shock in the setting of NSTEMI.  He has been afebrile and WBC is down-trending however his ABD ultrasound with findings compatible with acute cholecystitis P: Lactic remains elevated at 2.6, repeat now  -cont empiric Zosyn  -No abdominal tenderness on exam  -HIDA pending (done this am)  -on midodrine and low dose norepi today (cont to titrate as able) -blood cx from 10/20:ngtd  Acute Cholecystitis  Nausea, vomiting, abdominal pain -Pt also has history of  gallstones on prior CT and signs of acute cholecystitis on ABD ultrasound Possibly worsened by Dopamine, pt c/o constipation and may be developing ileus.  Lipase normal.  P: Remains on  Zosyn as above  -Surgery consulted for potential intervention, they have ordered HIDA for pt at this time with benign exam.    ESRD with failed renal tx 2009 P: Nephrology following TTS normal -did well with hd yesterday     Best practice:  Diet: as tolerated Pain/Anxiety/Delirium protocol (if indicated): n/a VAP protocol (if indicated): N/A DVT prophylaxis: warfarin, held at this time GI prophylaxis: Protonix Glucose control: SSI, lantus Mobility: bed rest Code Status: full code Family Communication: per primary  Disposition: ICU  Labs   CBC: Recent Labs  Lab 07/28/20 0121 07/28/20 0121 07/30/20 0504 07/30/20 1150 07/31/20 0328 07/31/20 0404 08/01/20 0030  WBC 8.4  --  17.2* 11.2* 12.3*  --  10.6*  HGB 11.3*   < > 13.1 9.5* 11.4* 12.2* 10.8*  HCT 36.1*   < > 43.3 31.9* 37.8* 36.0* 36.0*  MCV 95.0  --  96.4 98.8 95.7  --  97.0  PLT 240  --  382 286 329  --  229   < > = values in this interval not displayed.    Basic Metabolic Panel: Recent Labs  Lab 07/28/20 0121 07/28/20 0121 07/28/20 1126 07/30/20 0504 07/30/20 1150 07/31/20 0328 07/31/20 0404 08/01/20 0030  NA 132*   < >  --  138 137 136 136 137  K 5.1   < >  --  5.0 6.2* 4.9 5.0 4.5  CL 92*   < >  --  97* 100 101 102 99  CO2 26  --   --  23 24 19*  --  24  GLUCOSE 330*   < >  --  96 94 54* 46* 34*  BUN 57*   < >  --  62* 68* 88* 90* 53*  CREATININE 10.21*   < >  --  9.11* 9.55* 10.95* 11.70* 7.96*  CALCIUM 8.4*  --   --  9.0 8.0* 8.4*  --  8.2*  MG  --   --  2.4 2.3  --   --   --   --   PHOS  --   --   --  5.7*  --   --   --  5.5*   < > = values in this interval not displayed.   GFR: Estimated Creatinine Clearance: 14.2 mL/min (A) (by C-G formula based on SCr of 7.96 mg/dL (H)). Recent Labs  Lab 07/30/20 0504  07/30/20 1144 07/30/20 1150 07/30/20 1411 07/31/20 0328 07/31/20 1100 08/01/20 0030  WBC 17.2*  --  11.2*  --  12.3*  --  10.6*  LATICACIDVEN  --  3.6*  --  2.6*  --  0.8  --     Liver Function Tests: Recent Labs  Lab 07/30/20 1411 08/01/20 0030  AST 40  --   ALT 26  --   ALKPHOS 48  --   BILITOT 1.6*  --   PROT 6.2*  --   ALBUMIN 2.8* 2.9*   Recent Labs  Lab 07/30/20 1231  LIPASE 42   No results for input(s): AMMONIA in the last 168 hours.  ABG    Component Value Date/Time   PHART 7.301 (L) 10/14/2015 1821   PCO2ART 55.1 (H) 10/14/2015 1821   PO2ART 100.0 10/14/2015 1821   HCO3 27.2 (H) 10/14/2015 1821   TCO2 23 07/31/2020 0404   ACIDBASEDEF 4.6 (H) 10/13/2015 1420   O2SAT 97.0 10/14/2015 1821     Coagulation Profile: Recent Labs  Lab 07/27/20 1320 07/30/20 0504 07/31/20 0328 08/01/20 0030  INR 1.8* 1.4* 2.0* 2.1*    Cardiac Enzymes: No results for input(s): CKTOTAL, CKMB, CKMBINDEX, TROPONINI in the last 168 hours.  HbA1C: Hgb A1c MFr Bld  Date/Time Value Ref Range Status  07/28/2020 01:21 AM 6.8 (H) 4.8 - 5.6 % Final    Comment:    (NOTE)         Prediabetes: 5.7 - 6.4         Diabetes: >6.4         Glycemic control for adults with diabetes: <7.0   05/13/2020 11:01 AM 7.5 (H) 4.6 - 6.5 % Final    Comment:    Glycemic Control Guidelines for People with Diabetes:Non Diabetic:  <6%Goal of Therapy: <7%Additional Action Suggested:  >8%     CBG: Recent Labs  Lab 07/31/20 2245 08/01/20 0040 08/01/20 0138 08/01/20 0419 08/01/20 0643  GLUCAP 91 42* 109* 80 75    Critical care time: The patient is critically ill with multiple organ systems failure and requires high complexity decision making for assessment and support, frequent evaluation and titration of therapies, application of advanced monitoring technologies and extensive interpretation of multiple databases.  Critical care time 35 mins. This represents my time independent of the NPs  time taking care of the pt. This is  excluding procedures.    [facility] 08/01/2020, 8:54 AM

## 2020-08-01 NOTE — Progress Notes (Addendum)
Patient was hypoglycemic on AC dinner CBG. Sidebar followed with no response initially.   1541 CBG 63 4 oz juice given 1603 CBG 61 4 oz juice given 1621 CBG 56 4 g of glucose tablets given 1649 CBG 90  Hypoglycemia resolved.

## 2020-08-01 NOTE — Progress Notes (Addendum)
Hypoglycemic Event  CBG:42  Treatment: 08/01/2020 0100 - Administered 1 amp of  D50%   Symptoms: sweating  Follow-up CBG: Time:0039  CBG Result:109  Possible Reasons for Event:  Not eating, NPO for procedure in the morning  Comments/MD notified: Dr. Sonia Side    Orders Received: Start D10% infusion @ 75 cc/hr    Mena Goes, RN

## 2020-08-01 NOTE — Plan of Care (Signed)
  Problem: Education: Goal: Understanding of CV disease, CV risk reduction, and recovery process will improve Outcome: Progressing   Problem: Activity: Goal: Ability to return to baseline activity level will improve Outcome: Progressing   Problem: Cardiovascular: Goal: Ability to achieve and maintain adequate cardiovascular perfusion will improve Outcome: Progressing   Problem: Health Behavior/Discharge Planning: Goal: Ability to safely manage health-related needs after discharge will improve Outcome: Progressing   

## 2020-08-01 NOTE — Progress Notes (Signed)
Patient returned from HIDA scan at 0930. In no acute distress.

## 2020-08-02 ENCOUNTER — Encounter (HOSPITAL_COMMUNITY): Payer: Self-pay | Admitting: Cardiovascular Disease

## 2020-08-02 ENCOUNTER — Ambulatory Visit (HOSPITAL_COMMUNITY): Payer: Medicare HMO

## 2020-08-02 DIAGNOSIS — I25110 Atherosclerotic heart disease of native coronary artery with unstable angina pectoris: Secondary | ICD-10-CM | POA: Diagnosis not present

## 2020-08-02 DIAGNOSIS — I959 Hypotension, unspecified: Secondary | ICD-10-CM

## 2020-08-02 DIAGNOSIS — Z992 Dependence on renal dialysis: Secondary | ICD-10-CM | POA: Diagnosis not present

## 2020-08-02 DIAGNOSIS — I214 Non-ST elevation (NSTEMI) myocardial infarction: Secondary | ICD-10-CM | POA: Diagnosis not present

## 2020-08-02 DIAGNOSIS — K5909 Other constipation: Secondary | ICD-10-CM

## 2020-08-02 DIAGNOSIS — I2510 Atherosclerotic heart disease of native coronary artery without angina pectoris: Secondary | ICD-10-CM

## 2020-08-02 DIAGNOSIS — I9589 Other hypotension: Secondary | ICD-10-CM | POA: Diagnosis not present

## 2020-08-02 DIAGNOSIS — N186 End stage renal disease: Secondary | ICD-10-CM | POA: Diagnosis not present

## 2020-08-02 LAB — GLUCOSE, CAPILLARY
Glucose-Capillary: 173 mg/dL — ABNORMAL HIGH (ref 70–99)
Glucose-Capillary: 47 mg/dL — ABNORMAL LOW (ref 70–99)
Glucose-Capillary: 51 mg/dL — ABNORMAL LOW (ref 70–99)
Glucose-Capillary: 109 mg/dL — ABNORMAL HIGH (ref 70–99)
Glucose-Capillary: 202 mg/dL — ABNORMAL HIGH (ref 70–99)
Glucose-Capillary: 231 mg/dL — ABNORMAL HIGH (ref 70–99)
Glucose-Capillary: 220 mg/dL — ABNORMAL HIGH (ref 70–99)

## 2020-08-02 LAB — CBC
HCT: 31.4 % — ABNORMAL LOW (ref 39.0–52.0)
Hemoglobin: 9.7 g/dL — ABNORMAL LOW (ref 13.0–17.0)
MCH: 29.6 pg (ref 26.0–34.0)
MCHC: 30.9 g/dL (ref 30.0–36.0)
MCV: 95.7 fL (ref 80.0–100.0)
Platelets: 258 10*3/uL (ref 150–400)
RBC: 3.28 MIL/uL — ABNORMAL LOW (ref 4.22–5.81)
RDW: 18.2 % — ABNORMAL HIGH (ref 11.5–15.5)
WBC: 6.6 10*3/uL (ref 4.0–10.5)
nRBC: 0 % (ref 0.0–0.2)

## 2020-08-02 LAB — PROTIME-INR
INR: 2.4 — ABNORMAL HIGH (ref 0.8–1.2)
Prothrombin Time: 25.4 seconds — ABNORMAL HIGH (ref 11.4–15.2)

## 2020-08-02 LAB — MRSA PCR SCREENING: MRSA by PCR: NEGATIVE

## 2020-08-02 MED ORDER — PIPERACILLIN-TAZOBACTAM IN DEX 2-0.25 GM/50ML IV SOLN
2.2500 g | Freq: Three times a day (TID) | INTRAVENOUS | Status: DC
  Administered 2020-08-03: 2.25 g via INTRAVENOUS
  Filled 2020-08-02 (×2): qty 50

## 2020-08-02 MED ORDER — BISACODYL 10 MG RE SUPP
10.0000 mg | Freq: Once | RECTAL | Status: AC
  Administered 2020-08-02: 10 mg via RECTAL
  Filled 2020-08-02: qty 1

## 2020-08-02 NOTE — Progress Notes (Signed)
.   Progress Note  Patient Name: [name] [name] Date of Encounter: 08/02/2020  [facility] Cardiologist: Lauree Chandler, MD   Subjective   He feels much better.  Blood pressure is improved as is heart rate.  Levophed has been discontinued.  He has not had chest pain.  He denies dyspnea.  His major concern is that he has not had a bowel movement in 8 days.  Had dialysis yesterday without difficulty.  Inpatient Medications    Scheduled Meds:  aspirin  81 mg Oral Daily   atorvastatin  80 mg Oral Daily   Chlorhexidine Gluconate Cloth  6 each Topical Daily   clopidogrel  75 mg Oral Q breakfast   ferric citrate  210 mg Oral TID WC   insulin aspart  0-15 Units Subcutaneous TID WC   insulin aspart  0-5 Units Subcutaneous QHS   midodrine  10 mg Oral TID WC    morphine injection  0.5 mg Intravenous Once   pantoprazole  40 mg Oral Daily   patiromer  8.4 g Oral Daily   sevelamer carbonate  2,400 mg Oral TID WC   sodium chloride flush  3 mL Intravenous Q12H   Continuous Infusions:  sodium chloride     sodium chloride     sodium chloride     sodium chloride Stopped (08/01/20 0641)   sodium chloride     albumin human     norepinephrine (LEVOPHED) Adult infusion Stopped (08/01/20 1224)   piperacillin-tazobactam (ZOSYN)  IV 100 mL/hr at 08/02/20 0800   PRN Meds: sodium chloride, sodium chloride, sodium chloride, [CANCELED] Place/Maintain arterial line **AND** sodium chloride, acetaminophen, clonazePAM, lidocaine (PF), lidocaine-prilocaine, pentafluoroprop-tetrafluoroeth, sodium chloride flush   Vital Signs    Vitals:   08/02/20 0700 08/02/20 0730 08/02/20 0800 08/02/20 0830  BP:      Pulse: 100 (!) 105 (!) 108 100  Resp: 19 17 13 14   Temp:  (!) 96.5 F (35.8 C)    TempSrc:  Oral    SpO2: 94% 91% 90% 91%  Weight:      Height:        Intake/Output Summary (Last 24 hours) at 08/02/2020 0910 Last data filed at 08/02/2020 0800 Gross per 24 hour    Intake 765.16 ml  Output 1600 ml  Net -834.84 ml   Last 3 Weights 08/01/2020 08/01/2020 08/01/2020  Weight (lbs) 289 lb 11 oz 294 lb 1.5 oz 293 lb 6.9 oz  Weight (kg) 131.4 kg 133.4 kg 133.1 kg  Some encounter information is confidential and restricted. Go to Review Flowsheets activity to see all data.      Telemetry    Atrial flutter with heart rates between 60 and 90 bpm- Personally Reviewed  ECG    N/A  Physical Exam  Sitting [place] style in his bed. GEN: Plethoric facial complexion Neck:  No obvious JVD Cardiac: Irregular heart rhythm.  Respiratory: Clear lung fields by anterior auscultation GI:  The abdomen is nontender. MS: No edema; No deformity. Neuro:   Decreased level of consciousness.  Arousable from sleep. Psych: Normal affect   Labs    High Sensitivity Troponin:   Recent Labs  Lab 07/27/20 1122 07/27/20 1320 07/30/20 1231 07/30/20 1411  TROPONINIHS >27,000* >27,000* 8,742* 8,935*      Chemistry Recent Labs  Lab 07/30/20 1150 07/30/20 1411 07/31/20 0328 07/31/20 0328 07/31/20 0404 08/01/20 0030 08/01/20 1230  NA   < >  --  136   < > 136 137 136  K   < >  --  4.9   < > 5.0 4.5 4.4  CL   < >  --  101   < > 102 99 98  CO2   < >  --  19*  --   --  24 23  GLUCOSE   < >  --  54*   < > 46* 34* 129*  BUN   < >  --  88*   < > 90* 53* 60*  CREATININE   < >  --  10.95*   < > 11.70* 7.96* 9.04*  CALCIUM   < >  --  8.4*  --   --  8.2* 8.2*  PROT  --  6.2*  --   --   --   --   --   ALBUMIN  --  2.8*  --   --   --  2.9* 2.6*  AST  --  40  --   --   --   --   --   ALT  --  26  --   --   --   --   --   ALKPHOS  --  48  --   --   --   --   --   BILITOT  --  1.6*  --   --   --   --   --   GFRNONAA   < >  --  5*  --   --  7* 6*  ANIONGAP   < >  --  16*  --   --  14 15   < > = values in this interval not displayed.     Hematology Recent Labs  Lab 08/01/20 0030 08/01/20 1230 08/02/20 0248  WBC 10.6* 7.8 6.6  RBC 3.71* 3.54* 3.28*  HGB 10.8* 10.4*  9.7*  HCT 36.0* 34.4* 31.4*  MCV 97.0 97.2 95.7  MCH 29.1 29.4 29.6  MCHC 30.0 30.2 30.9  RDW 18.6* 18.5* 18.2*  PLT 229 281 258    Radiology    NM Hepato W/EF  Result Date: 08/01/2020 CLINICAL DATA:  Cholelithiasis EXAM: NUCLEAR MEDICINE HEPATOBILIARY IMAGING WITH GALLBLADDER EF TECHNIQUE: Sequential images of the abdomen were obtained out to 60 minutes following intravenous administration of radiopharmaceutical. After oral ingestion of Ensure, gallbladder ejection fraction was determined. At 60 min, normal ejection fraction is greater than 33%. RADIOPHARMACEUTICALS:  5.15 mCi Tc-41m  Choletec IV COMPARISON:  None FINDINGS: Normal tracer extraction from bloodstream indicating normal hepatocellular function. Normal excretion of tracer into biliary tree. Gallbladder visualized at 27 min. Small bowel visualized at 24 min. No hepatic retention of tracer. Patient motion during second half of exam. Subjectively decrease of tracer from gallbladder following fatty meal stimulation. Calculated gallbladder ejection fraction is 14%, abnormally low. Patient reported no symptoms following Ensure ingestion. Normal gallbladder ejection fraction following Ensure ingestion is greater than 33% at 1 hour. IMPRESSION: Patent biliary tree. Abnormal gallbladder response to fatty meal stimulation with a decreased gallbladder ejection fraction of 14%. Electronically Signed   By: Lavonia Dana M.D.   On: 08/01/2020 11:30    Cardiac Studies   CORONARY STENT INTERVENTION  LEFT HEART CATH AND CORONARY ANGIOGRAPHY  Conclusion   1. Severe proximal to mid LAD stenosis 2. Chronic occlusion of the distal intermediate branch, small in caliber 3. Complete occlusion of the distal segment of the first obtuse marginal branch. Cannot exclude that this was the infarct vessel but the patient was pain free and this vessel did not appear  large enough for PCI. There is collateral filling of lateral wall branches from the RCA 4.  Successful PTCA/DES x 1 proximal to mid LAD  Recommendations: Will admit to the ICU. Will continue DAPT with ASA and Plavix. Resume coumadin prior to discharge. I would plan to use triple therapy with ASA, Plavix and coumadin for one month then stop ASA. Continue statin. Will not start a beta blocker given his soft BP. Echo tomorrow.         Patient Profile     54 y.o. male  with hx of ESRD on dialysis, COPD, AI, LBBB, OSA, DM II, chronic atrial flutter, chronic anticoagulation, chronic DVT,  chronic diastolic HF, history of endocarditis, presents with chest pain - found to have NSTEMI --> LAD stent.  Subsequent profound hypotension and bradycardia after dialysis, leading to IV pressor therapy.  Blood pressure now improved on midodrine and Levophed discontinued.  Assessment & Plan    1. Hypotension: Continue midodrine.  May be able to decrease the dose. 2. Coronary atherosclerotic heart disease with non-ST elevation MI: Continue dual antiplatelet therapy.  Risk factor modification to include aggressive lipid-lowering and glycemic control 3. Acute systolic and diastolic heart failure: Dialysis yesterday, seemingly well tolerated.   4. Atrial flutter: Bradycardia has resolved.  Will need to resume anticoagulation.   5. END-stage kidney disease: Dialysis per nephrology. 6. Chronic oral anticoagulation: Resume when all agree.  It does not appear that he will be having any surgical procedures. 7. Constipation: Given kidney failure, will defer to nephrology.  PROGNOSIS: Improved clinical state, especially blood pressure.  Plan transfer to telemetry 6 E.  For questions or updates, please contact [place] Please consult [email or web address] for contact info under        Signed, Sinclair Grooms, MD  08/02/2020, 9:10 AM

## 2020-08-02 NOTE — Consult Note (Addendum)
Palliative Medicine Inpatient Consult Note  Reason for consult:  Goals of Care "please see for [place], pt w/ long hx esrd, now w/ worsening heart problems, acutely ill on monday but doing better now w/ IV abx, please eval for GOC"  HPI:  Per intake H&P --> [name] [name] is a 54 y.o.M with PMH significant for ESRD, heart failure, DM who presented on 10/16 with chest pain.  He was found to have A-fib and NSTEMI and taken for cath which showed severe proximal to mid-LAD stenosis s/p DESx1 and EF of 35-40%.    Palliative care was asked to get involved to aid in goals of care conversations given worsening heart failure.  Clinical Assessment/Goals of Care: I have reviewed medical records including EPIC notes, labs and imaging, received report from bedside RN, assessed the patient who was alert and oriented to person time place and situation.    I met with [name] [name] to further discuss diagnosis prognosis, GOC, EOL wishes, disposition and options.   I introduced Palliative Medicine as specialized medical care for people living with serious illness. It focuses on providing relief from the symptoms and stress of a serious illness. The goal is to improve quality of life for both the patient and the family.  [name] [name] shares with me that he is from [place].  His native language is [place].  He is lived in the [place] area for many years now.  He has been married once though is now divorced.  He has no children he used to work in Engineer, technical sales at [employer].  [name] [name] does consider himself a man of faith though is nondenominational.  He lives in a apartment independently.  He has CNA help for 3 hours daily 7 days a week.  He does have an electronic scooter for mobility purposes.  He is able to utilize a walker for short distances.  He is independent of his basic activities of daily living with the aforementioned, assistive devices.  I asked [name] [name] what he understands about his present health  situation.  He states that he was in fairly good health until 2 months ago.  It was around that time that he started having worsening "clotting" at his dialysis center.  He believes because his lines were not being well flushed by the dialysis nursing team.    He states that he came in after not eating for 3 days and having ongoing unremitting nausea.  It was at this time that they determined that he was having a "heart attack".  I asked him if he understood the severity of his cardiac disease and he said no he does not.  We reviewed congestive heart failure and the progressive nature of the disease.  We talked about how over time he will likely have a worsening of symptoms requiring additional aids such as supplemental oxygen and/or additional durable medical equipment.  We reviewed how patient will need to maintain vigilance with medications diet and exercise to optimize his health as best as possible.  In regard to his kidney disease.  [name] [name] has been on hemodialysis since 2017.  He had received a renal transplant at [facility] in 2009 though unfortunately that failed in 2016.  For the most part [name] [name] does not have complaints about hemodialysis treatment other than what was mentioned above regarding staffing.  A detailed discussion was had today regarding advanced directives -the patient reports that he is never created advanced directives though he would be interested in doing so  while he is hospitalized.    Concepts specific to code status, artifical feeding and hydration, continued IV antibiotics and rehospitalization was had.    The patient shares that he would want every effort made to continue living inclusive of cardiopulmonary resuscitation and intubation.  We talked about a time trial of intubation and if he was not making meaningful improvement per the intensivist team to consider transitioning him thereafter.  He states that he would rely upon his friend [name] [name] to make this  decision if you are ever in such a state.    I asked that the patient review the MOST form and we can complete it tomorrow based upon what his wishes would be.  I shared that in situations with patient such as him - given his degree of renal and cardiac disease that I worry pursuing "everything" could result in a multitude of complications and a decrease in his overall quality of life.  He asked me if anyone would elect to be a DO NOT RESUSCITATE?  I shared many circumstances where patient's had.  I reiterated that DO NOT RESUSCITATE does not indicate we would not treat him, rather if he had an immanent  event we would not potentially cause him more harm than benefit by pursuing CPR/Intubation.  The difference between a aggressive medical intervention path  and a palliative comfort care path for this patient at this time was had. Values and goals of care important to patient and family were attempted to be elicited -patient states that he has quite a bit of joy in his life and enjoys more than anything "living".  Discussed the importance of continued conversation with family and their  medical providers regarding overall plan of care and treatment options, ensuring decisions are within the context of the patients values and GOCs.  Decision Maker: Patient is able to make decisions for himself so if ever in a position where he will are incapacitated he would rely upon his friend [name] [name] to make decisions for him.  SUMMARY OF RECOMMENDATIONS   Full code/full scope of care  TOC - OP Palliative Care -given chronic comorbid conditions would benefit from ongoing support to discuss goals of care  Spiritual Support - Create advanced directives & a living will  Code Status/Advance Care Planning: FULL CODE   Palliative Prophylaxis:   Mobility  Additional Recommendations (Limitations, Scope, Preferences):  Continue current scope of care   Psycho-social/Spiritual:   Desire for further  Chaplaincy support:  Yes-nondenominational  Additional Recommendations:  Education on progressiveness of diseases such as heart failure   Prognosis:  Unclear  Discharge Planning:  Discharge to home with home health  PPS: 50%   This conversation/these recommendations were discussed with patient primary care team, Dr. Angelena Form  Time In: 4239 Time Out:  1530 Total Time: 70 Greater than 50%  of this time was spent counseling and coordinating care related to the above assessment and plan.  [place] Team Team Cell Phone: [phone] Please utilize secure chat with additional questions, if there is no response within 30 minutes please call the above phone number  Palliative Medicine Team providers are available by phone from 7am to 7pm daily and can be reached through the team cell phone.  Should this patient require assistance outside of these hours, please call the patient's attending physician.

## 2020-08-02 NOTE — Progress Notes (Signed)
[name] [name], MRN:  [id number], DOB:  May 13, 1966, LOS: 6 ADMISSION DATE:  07/27/2020, CONSULTATION DATE:  08/02/20 REFERRING MD:  Jonnie Finner, CHIEF COMPLAINT:  shock   Brief History   54 y.o. M with PMH ESRD admitted with chest pain and NSTEMI, developed nausea, vomiting, hypotension and bradycardia and was placed on Dopamine and transferred to ICU.   History of present illness   [name] [name] is a 54 y.o.M with PMH significant for ESRD, heart failure, DM who presented on 10/16 with chest pain.  He was found to have A-fib and NSTEMI and taken for cath which showed severe proximal to mid-LAD stenosis s/p DESx1 and EF of 35-40%.   He developed hypotension and Bradycardia on 10/18 thought to be multi-factorial including autonomic dysfunction, volume depletion and vascular issues making measurment difficult.  He was initially given 500cc IVF and then started on Dopamine.  This had to maximized, so patient was transferred to the ICU and PCCM consulted.   Pt is awake and alert and states that he has been nauseated with vomiting for the last day.  He denies abdominal pain, but later c/o epigastric discomfort.   No chest pain, hypoxia or dyspnea.  He feels constipated and last BM was four days ago.   Past Medical History   has a past medical history of Allergic rhinitis, Allergy, Anemia, Aortic insufficiency, Bilateral carpal tunnel syndrome, Cataract, CHF (congestive heart failure) ([place]), Claustrophobia, COPD (chronic obstructive pulmonary disease) ([place]), Diabetes mellitus, Diabetic peripheral neuropathy ([place]), Diabetic retinopathy ([place]), Discitis of lumbar region, DVT (deep venous thrombosis) ([place]) (10/07/2015), Endocarditis (11/28/14), ESRD (end stage renal disease) on dialysis [facility]), Gait disorder, Gastroparesis, GERD (gastroesophageal reflux disease), Gout, Hearing difficulty, Hemodialysis patient ([place]), Hyperlipidemia, Hypertension, Incomplete bladder emptying, Leg pain (02/05/11), Morbid obesity ([place]),  Obstructive sleep apnea, Osteomyelitis ([place]), Posttraumatic stress disorder, Rotator cuff disorder, Secondary hyperparathyroidism ([place]), Sepsis ([place]), Sleep apnea, Urethral stricture, and Vitamin D deficiency.  Significant Hospital Events   10/16 Admit to cardiology 10/18 Hypotension and ICU txfr 10/19 PCCM consult  Consults:  Nephrology PCCM  Procedures:    Significant Diagnostic Tests:  10/16 Cardiac Cath >>Severe proximal to mid LAD stenosis 10/19 Echo EF 40-45%, mild LVH 10/19 RUQ u/s: Small layering gallstones within the gallbladder. Markedly thickened gallbladder wall with pericholecystic fluid and tenderness over the gallbladder during the study. Findings compatible with acute Cholecystitis. 10/16 LHC 07/27/2020  Lat Ramus lesion is 100% stenosed.  1st Mrg lesion is 100% stenosed.  Prox LAD lesion is 99% stenosed.  A drug-eluting stent was successfully placed using a STENT RESOLUTE ONYX 3.5X18. Post intervention, there is a 0% residual stenosis.  10/20 HIDA: pending   Micro Data:  10/16 Covid-19 and flu>>negative 10/20 blood cx: ngtd Antimicrobials:  Zosyn 10/20->  Interim history/subjective:   Patient has no complaints this morning.  Feels better.  Remains off pressors.  Afebrile.  Objective   Blood pressure (!) 121/99, pulse (!) 108, temperature (!) 96.5 F (35.8 C), temperature source Oral, resp. rate 13, height 5\' 10"  (1.778 m), weight 131.4 kg, SpO2 90 %.        Intake/Output Summary (Last 24 hours) at 08/02/2020 0833 Last data filed at 08/02/2020 0800 Gross per 24 hour  Intake 783.91 ml  Output 1600 ml  Net -816.09 ml   Filed Weights   08/01/20 0642 08/01/20 1230 08/01/20 1600  Weight: 133.1 kg 133.4 kg 131.4 kg    Physical exam General: Awake alert comfortable. HEENT: Tracking appropriately Neuro: Alert following commands  move all 4 extremities CV: Regular rate rhythm, S1-S2 PULM: Clear to auscultation bilaterally GI: Obese soft  nontender nondistended Extremities: No significant edema Skin: No rash   Resolved Hospital Problem list     Assessment & Plan:   CAD S/P NSTEMI  -A drug-eluting stent was successfully placed 10/16 to LAD -plavix remains on, coumadin has been stopped should his INR get <2 would start heparin as we await definitive gb intervention recommendations ( HIDA pending) Combined systolic and diastolic CHF -Echo EF 41-66%, mild LVH P: Continue management per cardiology services Remains on telemetry. Per cardiology stable for transfer from the intensive care unit. Critical care will sign off at this time.  Persistent atypical appearing atrial flutter with slow ventricular rate and AV block  -Per chart review he has had episodes of A-fib/flutter as far back as 2017, not on AV nodal blocker at baseline due to hypotension  -intermittent ectopy. No noted pauses this morning.  -not on ac at this time 2/2 potential need for surgery vs drain for gb  Hx of LLE DVT P: Coumadin  Hypotension and severe bradycardia  -Requiring ICU transfer and vasopressors P: Remains off pressors at this time Continue midodrine  Concern for underlying shock state possible septic shock  Unclear source.  Possibly related to NSTEMI and/or sepsis. P: Surgery felt no need for intervention  Acute Cholecystitis  Nausea, vomiting, abdominal pain  P: Would complete course of antimicrobials  ESRD with failed renal tx 2009 P: IHD per nephrology.   Best practice:  Diet: as tolerated Pain/Anxiety/Delirium protocol (if indicated): n/a VAP protocol (if indicated): N/A DVT prophylaxis: warfarin, held at this time GI prophylaxis: Protonix Glucose control: SSI, lantus Mobility: bed rest Code Status: full code Family Communication: per primary  Disposition: Stable for transfer per cardiology services.  Labs   CBC: Recent Labs  Lab 07/30/20 1150 07/30/20 1150 07/31/20 0328 07/31/20 0404 08/01/20 0030  08/01/20 1230 08/02/20 0248  WBC 11.2*  --  12.3*  --  10.6* 7.8 6.6  HGB 9.5*   < > 11.4* 12.2* 10.8* 10.4* 9.7*  HCT 31.9*   < > 37.8* 36.0* 36.0* 34.4* 31.4*  MCV 98.8  --  95.7  --  97.0 97.2 95.7  PLT 286  --  329  --  229 281 258   < > = values in this interval not displayed.    Basic Metabolic Panel: Recent Labs  Lab 07/28/20 0121 07/28/20 1126 07/30/20 0504 07/30/20 0504 07/30/20 1150 07/31/20 0328 07/31/20 0404 08/01/20 0030 08/01/20 1230  NA   < >  --  138   < > 137 136 136 137 136  K   < >  --  5.0   < > 6.2* 4.9 5.0 4.5 4.4  CL   < >  --  97*   < > 100 101 102 99 98  CO2   < >  --  23  --  24 19*  --  24 23  GLUCOSE   < >  --  96   < > 94 54* 46* 34* 129*  BUN   < >  --  62*   < > 68* 88* 90* 53* 60*  CREATININE   < >  --  9.11*   < > 9.55* 10.95* 11.70* 7.96* 9.04*  CALCIUM   < >  --  9.0  --  8.0* 8.4*  --  8.2* 8.2*  MG  --  2.4 2.3  --   --   --   --   --   --  PHOS  --   --  5.7*  --   --   --   --  5.5* 5.6*   < > = values in this interval not displayed.   GFR: Estimated Creatinine Clearance: 12.4 mL/min (A) (by C-G formula based on SCr of 9.04 mg/dL (H)). Recent Labs  Lab 07/30/20 1144 07/30/20 1150 07/30/20 1411 07/31/20 0328 07/31/20 1100 08/01/20 0030 08/01/20 1230 08/02/20 0248  WBC  --    < >  --  12.3*  --  10.6* 7.8 6.6  LATICACIDVEN 3.6*  --  2.6*  --  0.8  --   --   --    < > = values in this interval not displayed.    Liver Function Tests: Recent Labs  Lab 07/30/20 1411 08/01/20 0030 08/01/20 1230  AST 40  --   --   ALT 26  --   --   ALKPHOS 48  --   --   BILITOT 1.6*  --   --   PROT 6.2*  --   --   ALBUMIN 2.8* 2.9* 2.6*   Recent Labs  Lab 07/30/20 1231  LIPASE 42   No results for input(s): AMMONIA in the last 168 hours.  ABG    Component Value Date/Time   PHART 7.301 (L) 10/14/2015 1821   PCO2ART 55.1 (H) 10/14/2015 1821   PO2ART 100.0 10/14/2015 1821   HCO3 27.2 (H) 10/14/2015 1821   TCO2 23 07/31/2020  0404   ACIDBASEDEF 4.6 (H) 10/13/2015 1420   O2SAT 97.0 10/14/2015 1821     Coagulation Profile: Recent Labs  Lab 07/27/20 1320 07/30/20 0504 07/31/20 0328 08/01/20 0030 08/02/20 0248  INR 1.8* 1.4* 2.0* 2.1* 2.4*    Cardiac Enzymes: No results for input(s): CKTOTAL, CKMB, CKMBINDEX, TROPONINI in the last 168 hours.  HbA1C: Hgb A1c MFr Bld  Date/Time Value Ref Range Status  07/28/2020 01:21 AM 6.8 (H) 4.8 - 5.6 % Final    Comment:    (NOTE)         Prediabetes: 5.7 - 6.4         Diabetes: >6.4         Glycemic control for adults with diabetes: <7.0   05/13/2020 11:01 AM 7.5 (H) 4.6 - 6.5 % Final    Comment:    Glycemic Control Guidelines for People with Diabetes:Non Diabetic:  <6%Goal of Therapy: <7%Additional Action Suggested:  >8%     CBG: Recent Labs  Lab 08/01/20 1649 08/01/20 2119 08/02/20 0603 08/02/20 0627 08/02/20 0730  GLUCAP 90 173* 47* 51* 109*    Critical care will sign off at this time.  We appreciate the consultation.  Garner Nash, DO [facility] 08/02/2020 8:33 AM

## 2020-08-02 NOTE — Plan of Care (Signed)
  Problem: Education: Goal: Understanding of CV disease, CV risk reduction, and recovery process will improve Outcome: Progressing   Problem: Activity: Goal: Ability to return to baseline activity level will improve Outcome: Progressing   Problem: Cardiovascular: Goal: Ability to achieve and maintain adequate cardiovascular perfusion will improve Outcome: Progressing   Problem: Health Behavior/Discharge Planning: Goal: Ability to safely manage health-related needs after discharge will improve Outcome: Progressing   

## 2020-08-02 NOTE — Progress Notes (Addendum)
[facility] Progress Note   Subjective:  Seen in room. Off pressors, had HD yest w/ 1.6 L off, feeling better today. No abd pain. BP's 90's on HD, 120/70 this am.   Objective Vitals:   08/02/20 0730 08/02/20 0800 08/02/20 0830 08/02/20 0900  BP:      Pulse: (!) 105 (!) 108 100 98  Resp: 17 13 14 13   Temp: (!) 96.5 F (35.8 C)     TempSrc: Oral     SpO2: 91% 90% 91% 94%  Weight:      Height:       Physical Exam General: Chronically ill appearing man, NAD. Nasal O2. Heart: RRR; no murmur Lungs: CTA anteriorly and post, no rales or wheezing Abdomen: soft, non-tender Extremities: No LE edema Dialysis Access: RUE AVG + bruit  Additional Objective Labs: Basic Metabolic Panel: Recent Labs  Lab 07/30/20 0504 07/30/20 1150 07/31/20 0328 07/31/20 0328 07/31/20 0404 08/01/20 0030 08/01/20 1230  NA 138   < > 136   < > 136 137 136  K 5.0   < > 4.9   < > 5.0 4.5 4.4  CL 97*   < > 101   < > 102 99 98  CO2 23   < > 19*  --   --  24 23  GLUCOSE 96   < > 54*   < > 46* 34* 129*  BUN 62*   < > 88*   < > 90* 53* 60*  CREATININE 9.11*   < > 10.95*   < > 11.70* 7.96* 9.04*  CALCIUM 9.0   < > 8.4*  --   --  8.2* 8.2*  PHOS 5.7*  --   --   --   --  5.5* 5.6*   < > = values in this interval not displayed.   CBC: Recent Labs  Lab 07/30/20 1150 07/30/20 1150 07/31/20 0328 07/31/20 0404 08/01/20 0030 08/01/20 1230 08/02/20 0248  WBC 11.2*   < > 12.3*   < > 10.6* 7.8 6.6  HGB 9.5*   < > 11.4*   < > 10.8* 10.4* 9.7*  HCT 31.9*   < > 37.8*   < > 36.0* 34.4* 31.4*  MCV 98.8  --  95.7  --  97.0 97.2 95.7  PLT 286   < > 329   < > 229 281 258   < > = values in this interval not displayed.  Medications:  sodium chloride     sodium chloride     sodium chloride     sodium chloride Stopped (08/01/20 0641)   sodium chloride     albumin human     norepinephrine (LEVOPHED) Adult infusion Stopped (08/01/20 1224)   piperacillin-tazobactam (ZOSYN)  IV Stopped  (08/02/20 0818)    aspirin  81 mg Oral Daily   atorvastatin  80 mg Oral Daily   Chlorhexidine Gluconate Cloth  6 each Topical Daily   clopidogrel  75 mg Oral Q breakfast   ferric citrate  210 mg Oral TID WC   insulin aspart  0-15 Units Subcutaneous TID WC   insulin aspart  0-5 Units Subcutaneous QHS   midodrine  10 mg Oral TID WC    morphine injection  0.5 mg Intravenous Once   pantoprazole  40 mg Oral Daily   patiromer  8.4 g Oral Daily   sevelamer carbonate  2,400 mg Oral TID WC   sodium chloride flush  3 mL Intravenous Q12H  Home meds:  - auryxia 1 ac tid/ phoslo 3 ac tid/ renvela 3 ac tid  - lokelma qd/  veltassa qd  - crestor 20  - insulin toujeo bid/ regular insulin tid ac  - protonix / xyloprim   - clonazepam mg bid prn  - warfarin qd as directed  - prn's/ vitamins/ supplements    Dialysis Orders: TTS - [facility] 4.5h   450/A1.5   129kg   1K/2.25Ca bath   AVG   Hep none  - Hectoral 56mcg Iv q HD - No ESA recently   CXR 10/16 - IMPRESSION: Opacity left base concerning for potential pneumonia. Lungs otherwise clear. There is cardiac enlargement. No adenopathy appreciable.    Assessment/Plan: 1. Hypotension - much better, still on low dose pressors. Possibly sepsis from GB infection causing shock. BP's better, off pressors today. Started on midodrine here 10 tid.    2. R/O acute cholecystitis - +US findings, gen surgery consulted, on IV abx. HIDA scan negative.   3. Atrial flutter / bradycardia - bradycardia resolved. HR 80-90's today. Not getting any nodal blocking agents.  4. NSTEMI/CAD: s/p LHC with DES placement to proximal LAD 10/16. Had 100% occlusion of 2 smaller branches as well - no intervention options. On DAPT + warfarin. Warfarin on hold in case procedures needed.  5. ESRD/ hx failed renal Tx 2009 - long-term esrd pt. HD yest and tolerated well, plan next HD Saturday.  6. Hyperkalemia - chronic, on daily Veltassa. K+ high 4's 7. Volume - is up  2kg, UF 1-2 L w/ next hd 8. Anemia: Hgb 11.3 - no ESA needed for now. 9. Secondary hyperparathyroidism: Ca ok, Phos pending. Continue Auryxia and renvela as binders 10. T2DM: Insulin per primary.   Kelly Splinter, MD 08/02/2020, 9:50 AM

## 2020-08-02 NOTE — Progress Notes (Signed)
CARDIAC REHAB PHASE I   MI education completed with pt. Pt educated on importance of ASA, Plavix, and NTG. Pt given MI book. Spoke about importance of watching salt and sugar intake. Reviewed site care and restrictions. Encouraged mobility as able with emphasis on safety. Will refer to CRP II [place].  [id number] Rufina Falco, RN BSN 08/02/2020 1:54 PM

## 2020-08-02 NOTE — Progress Notes (Signed)
This chaplain responded to PMT consult for spiritual care and AD education.  The Pt. is awake and alert in the bedside recliner.  The Pt. greeted the chaplain with,  "how can the doctors know what is going to happen to me?"  The chaplain paused with the Pt. and practiced reflective listening as the Pt. questioned the balance of science and spirituality.   The chaplain left the AD education and a brief explanation of the document with the Pt.  The Pt. requested time to sort his thoughts and reflect on his own story.  The chaplain will F/U as needed.

## 2020-08-02 NOTE — Progress Notes (Signed)
[name] [name] for warfarin to heparin Indication: atrial fibrillation  Allergies  Allergen Reactions  . Pork-Derived Products Anaphylaxis and Other (See Comments)    Fever also   . Ace Inhibitors Cough  . Hydrocodone Other (See Comments)    Hallucinations  . Pork Allergy     Other reaction(s): Unknown    Patient Measurements: Height: _0  (177.8 cm) Weight: 131.4 kg (289 lb 11 oz) IBW/kg (Calculated) : 73  Vital Signs: Temp: 96.5 F (35.8 C) (10/22 0730) Temp Source: Oral (10/22 0730) Pulse Rate: 108 (10/22 0800)  Labs: Recent Labs    07/30/20 1150 07/30/20 1231 07/30/20 1411 07/31/20 0328 07/31/20 0328 07/31/20 0404 08/01/20 0030 08/01/20 0030 08/01/20 1230 08/02/20 0248  HGB   < >  --   --  11.4*   < > 12.2* 10.8*   < > 10.4* 9.7*  HCT   < >  --   --  37.8*   < > 36.0* 36.0*  --  34.4* 31.4*  PLT   < >  --   --  329   < >  --  229  --  281 258  LABPROT  --   --   --  22.1*  --   --  22.5*  --   --  25.4*  INR  --   --   --  2.0*  --   --  2.1*  --   --  2.4*  CREATININE   < >  --   --  10.95*   < > 11.70* 7.96*  --  9.04*  --   TROPONINIHS  --  6,010* 9,323*  --   --   --   --   --   --   --    < > = values in this interval not displayed.    Estimated Creatinine Clearance: 12.4 mL/min (A) (by C-G formula based on SCr of 9.04 mg/dL (H)).   Medical History: Past Medical History:  Diagnosis Date  . Allergic rhinitis   . Allergy   . Anemia   . Aortic insufficiency    a. Echo 7/16:  Mod LVH, EF 50-55%, mild to mod AI, severe LAE, PASP 57 mmHg (LV ID end diastolic 55.7 mm);  b. Echo 2/17:  Mod LVH, EF 50-55%, mod AI, MAC, mild MR, mod LAE, mild to mod TR, PASP 63 mmHg  . Bilateral carpal tunnel syndrome   . Cataract    removed both eyes   . CHF (congestive heart failure) ([place])   . Claustrophobia   . COPD (chronic obstructive pulmonary disease) ([place])   . Diabetes mellitus    Type II  . Diabetic peripheral neuropathy  ([place])   . Diabetic retinopathy ([place])   . Discitis of lumbar region    resolved  . DVT (deep venous thrombosis) ([place]) 10/07/2015   LLE  . Endocarditis 11/28/14  . ESRD (end stage renal disease) on dialysis [facility])    s/p renal transplant in 2009, failed in 2016  . Gait disorder   . Gastroparesis   . GERD (gastroesophageal reflux disease)   . Gout   . Hearing difficulty   . Hemodialysis patient ([place])    tuesday, thursday, saturday   . Hyperlipidemia   . Hypertension   . Incomplete bladder emptying   . Leg pain 02/05/11   with walking  . Morbid obesity ([place])   . Obstructive sleep apnea    not using CPAP- lost 100lbs  .  Osteomyelitis ([place])   . Posttraumatic stress disorder   . Rotator cuff disorder   . Secondary hyperparathyroidism ([place])   . Sepsis ([place])    Postive blood cultures -   . Sleep apnea    no cpap  . Urethral stricture   . Vitamin D deficiency     Medications:  Medications Prior to Admission  Medication Sig Dispense Refill Last Dose  . Accu-Chek FastClix Lancets MISC USE TO CHECK BLOOD SUGARS 4 TIMES A DAY. (Patient taking differently: 1 each by Other route See admin instructions. USE TO CHECK BLOOD SUGARS 4 TIMES A DAY.) 360 each 4 07/27/2020 at Unknown time  . allopurinol (ZYLOPRIM) 100 MG tablet Take 1 tablet (100 mg total) by mouth 3 (three) times a week. After dialysis 30 tablet 6 Past Week at Unknown time  . AURYXIA 1 GM 210 MG(Fe) tablet Take 210 mg by mouth 3 (three) times daily with meals.    07/26/2020 at Unknown time  . calcium acetate (PHOSLO) 667 MG capsule Take 3 capsules (2,001 mg total) by mouth 3 (three) times daily with meals. 270 capsule 0 07/26/2020 at Unknown time  . clonazePAM (KLONOPIN) 0.5 MG tablet Take 1 tablet (0.5 mg total) by mouth 2 (two) times daily as needed for anxiety. 60 tablet 0 07/26/2020 at Unknown time  . DROPLET PEN NEEDLES 32G X 4 MM MISC USE  TO INJECT FIVE TIMES DAILY AS DIRECTED (Patient taking differently: 1 each by Other  route See admin instructions. Use to inject 5 times daily as directed.) 500 each 1 07/26/2020 at Unknown time  . glucose blood (ACCU-CHEK GUIDE) test strip 1 each by Other route as needed for other. Use as instructed to check blood sugar 3 times daily. DX:E11.65 300 each 1 07/27/2020 at Unknown time  . hydrocortisone 1 % ointment Apply 1 application topically 2 (two) times daily. 30 g 0 Past Week at Unknown time  . hydrocortisone-pramoxine (PROCTOFOAM-HC) rectal foam Place 1 applicator rectally 2 (two) times daily. 30 g 0 Past Week at Unknown time  . Insulin Pen Needle 32G X 8 MM MISC Use with insulin x5 times a day (Patient taking differently: 1 each by Other route See admin instructions. Use with insulin x5 times a day) 500 each 1 07/26/2020 at Unknown time  . insulin regular human CONCENTRATED (HUMULIN R U-500 KWIKPEN) 500 UNIT/ML kwikpen INJECT 50 UNITS UNDER THE SKIN AT BREAKFAST, 70 UNITS AT LUNCH, AND 60 UNITS AT DINNER. (Patient taking differently: Inject 50-70 Units into the skin See admin instructions. INJECT 50 UNITS UNDER THE SKIN AT BREAKFAST, 70 UNITS AT LUNCH, AND 60 UNITS AT DINNER.) 12 mL 0 07/26/2020 at Unknown time  . omeprazole (PRILOSEC) 20 MG capsule Take 1 capsule (20 mg total) by mouth daily. Annual appt due in Oct must see provider for future refills 90 capsule 1 07/26/2020 at Unknown time  . RENVELA 800 MG tablet Take 2,400 mg by mouth 3 (three) times daily with meals.    07/26/2020 at Unknown time  . rosuvastatin (CRESTOR) 20 MG tablet Take 1 tablet (20 mg total) by mouth at bedtime. Annual appt due in Oct must see provider for future refills 90 tablet 1 07/26/2020 at Unknown time  . TOUJEO MAX SOLOSTAR 300 UNIT/ML Solostar Pen INJECT 80 UNITS UNDER THE SKIN IN THE MORNING AND 80 UNITS IN THE EVENING (Patient taking differently: Inject 80 Units into the skin See admin instructions. INJECT 80 UNITS UNDER THE SKIN IN THE MORNING AND 80 UNITS  IN THE EVENING) 48 mL 1 07/26/2020 at  Unknown time  . VELTASSA 8.4 g packet Take 8.4 g by mouth daily.    07/26/2020 at Unknown time  . warfarin (COUMADIN) 5 MG tablet TAKE 1 TABLET EVERY DAY OR AS DIRECTED BY COUMADIN CLINIC. (Patient taking differently: Take 5 mg by mouth See admin instructions. TAKE 1 TABLET EVERY DAY OR AS DIRECTED BY COUMADIN CLINIC.) 100 tablet 0 07/26/2020 at Unknown time  . busPIRone (BUSPAR) 5 MG tablet TAKE 1 TABLET (5 MG TOTAL) BY MOUTH 2 (TWO) TIMES DAILY AS NEEDED. (Patient not taking: Reported on 07/27/2020) 180 tablet 1 Not Taking at Unknown time  . loperamide (IMODIUM) 2 MG capsule Take 1 capsule (2 mg total) by mouth as needed for diarrhea or loose stools. (Patient not taking: Reported on 07/27/2020) 30 capsule 0 Not Taking at Unknown time  . mupirocin ointment (BACTROBAN) 2 % Apply 1 application topically daily. Apply to dorsal foot wound daily (Patient not taking: Reported on 07/27/2020) 30 g 2 Not Taking at Unknown time  . sodium zirconium cyclosilicate (LOKELMA) 10 g PACK packet Take 10 g by mouth daily.    unk   Scheduled:  . aspirin  81 mg Oral Daily  . atorvastatin  80 mg Oral Daily  . Chlorhexidine Gluconate Cloth  6 each Topical Daily  . clopidogrel  75 mg Oral Q breakfast  . ferric citrate  210 mg Oral TID WC  . insulin aspart  0-15 Units Subcutaneous TID WC  . insulin aspart  0-5 Units Subcutaneous QHS  . midodrine  10 mg Oral TID WC  .  morphine injection  0.5 mg Intravenous Once  . pantoprazole  40 mg Oral Daily  . patiromer  8.4 g Oral Daily  . sevelamer carbonate  2,400 mg Oral TID WC  . sodium chloride flush  3 mL Intravenous Q12H    Assessment: 54 yo male with NSTEMI s/p cath with DES. He is on coumadin PTA for afib and pharmacy consulted to restart. Plans noted to drop ASA in 2-4 weeks, for now continues on ASA and Plavix.   Warfarin now on hold with plans for possible GI intervention. Heparin to begin once INR <2. This morning INR 2.4, last warfarin dose 10/20. If no further  procedures can likely resume warfarin.   Goal of Therapy:  INR 2-3 Monitor platelets by anticoagulation protocol: Yes   Plan:  -Hold heparin for now -F/U warfarin plans -INR daily   Arrie Senate, PharmD, BCPS Clinical Pharmacist [phone] Please check AMION for all [place] numbers 08/02/2020

## 2020-08-02 NOTE — Progress Notes (Addendum)
Report given to charge RN Ok Anis, RN was with another patient and unable to take report when called at 1355 and 1420. Patient transferred to [place] via wheelchair by SWOT with all belongings.

## 2020-08-03 ENCOUNTER — Encounter (HOSPITAL_COMMUNITY): Payer: Self-pay | Admitting: Cardiovascular Disease

## 2020-08-03 DIAGNOSIS — Z515 Encounter for palliative care: Secondary | ICD-10-CM | POA: Diagnosis not present

## 2020-08-03 DIAGNOSIS — I214 Non-ST elevation (NSTEMI) myocardial infarction: Secondary | ICD-10-CM | POA: Diagnosis not present

## 2020-08-03 DIAGNOSIS — R001 Bradycardia, unspecified: Secondary | ICD-10-CM

## 2020-08-03 LAB — GLUCOSE, CAPILLARY
Glucose-Capillary: 166 mg/dL — ABNORMAL HIGH (ref 70–99)
Glucose-Capillary: 151 mg/dL — ABNORMAL HIGH (ref 70–99)
Glucose-Capillary: 377 mg/dL — ABNORMAL HIGH (ref 70–99)
Glucose-Capillary: 150 mg/dL — ABNORMAL HIGH (ref 70–99)

## 2020-08-03 LAB — CBC
HCT: 33.7 % — ABNORMAL LOW (ref 39.0–52.0)
HCT: 31.7 % — ABNORMAL LOW (ref 39.0–52.0)
Hemoglobin: 10.2 g/dL — ABNORMAL LOW (ref 13.0–17.0)
Hemoglobin: 9.8 g/dL — ABNORMAL LOW (ref 13.0–17.0)
MCH: 29 pg (ref 26.0–34.0)
MCH: 29.3 pg (ref 26.0–34.0)
MCHC: 30.3 g/dL (ref 30.0–36.0)
MCHC: 30.9 g/dL (ref 30.0–36.0)
MCV: 95.7 fL (ref 80.0–100.0)
MCV: 94.9 fL (ref 80.0–100.0)
Platelets: 262 10*3/uL (ref 150–400)
Platelets: 284 10*3/uL (ref 150–400)
RBC: 3.52 MIL/uL — ABNORMAL LOW (ref 4.22–5.81)
RBC: 3.34 MIL/uL — ABNORMAL LOW (ref 4.22–5.81)
RDW: 18.5 % — ABNORMAL HIGH (ref 11.5–15.5)
RDW: 18.3 % — ABNORMAL HIGH (ref 11.5–15.5)
WBC: 6.6 10*3/uL (ref 4.0–10.5)
WBC: 6.4 10*3/uL (ref 4.0–10.5)
nRBC: 0 % (ref 0.0–0.2)
nRBC: 0 % (ref 0.0–0.2)

## 2020-08-03 LAB — BASIC METABOLIC PANEL
Anion gap: 12 (ref 5–15)
BUN: 49 mg/dL — ABNORMAL HIGH (ref 6–20)
CO2: 26 mmol/L (ref 22–32)
Calcium: 8.2 mg/dL — ABNORMAL LOW (ref 8.9–10.3)
Chloride: 97 mmol/L — ABNORMAL LOW (ref 98–111)
Creatinine, Ser: 9.16 mg/dL — ABNORMAL HIGH (ref 0.61–1.24)
GFR, Estimated: 6 mL/min — ABNORMAL LOW (ref >60)
Glucose, Bld: 189 mg/dL — ABNORMAL HIGH (ref 70–99)
Potassium: 4.2 mmol/L (ref 3.5–5.1)
Sodium: 135 mmol/L (ref 135–145)

## 2020-08-03 LAB — RENAL FUNCTION PANEL
Albumin: 2.7 g/dL — ABNORMAL LOW (ref 3.5–5.0)
Anion gap: 13 (ref 5–15)
BUN: 49 mg/dL — ABNORMAL HIGH (ref 6–20)
CO2: 25 mmol/L (ref 22–32)
Calcium: 8.1 mg/dL — ABNORMAL LOW (ref 8.9–10.3)
Chloride: 96 mmol/L — ABNORMAL LOW (ref 98–111)
Creatinine, Ser: 9.14 mg/dL — ABNORMAL HIGH (ref 0.61–1.24)
GFR, Estimated: 6 mL/min — ABNORMAL LOW (ref >60)
Glucose, Bld: 189 mg/dL — ABNORMAL HIGH (ref 70–99)
Phosphorus: 4.7 mg/dL — ABNORMAL HIGH (ref 2.5–4.6)
Potassium: 4.2 mmol/L (ref 3.5–5.1)
Sodium: 134 mmol/L — ABNORMAL LOW (ref 135–145)

## 2020-08-03 LAB — PROCALCITONIN: Procalcitonin: 2.23 ng/mL

## 2020-08-03 LAB — HEPATITIS B SURFACE ANTIGEN: Hepatitis B Surface Ag: NONREACTIVE

## 2020-08-03 LAB — PROTIME-INR
INR: 2 — ABNORMAL HIGH (ref 0.8–1.2)
Prothrombin Time: 22 seconds — ABNORMAL HIGH (ref 11.4–15.2)

## 2020-08-03 LAB — HEPATITIS B CORE ANTIBODY, IGM: Hep B C IgM: NONREACTIVE

## 2020-08-03 MED ORDER — CLONAZEPAM 0.5 MG PO TABS
0.5000 mg | ORAL_TABLET | Freq: Once | ORAL | Status: AC
  Administered 2020-08-03: 0.5 mg via ORAL
  Filled 2020-08-03: qty 1

## 2020-08-03 MED ORDER — WARFARIN - PHARMACIST DOSING INPATIENT: Freq: Every day | Status: DC

## 2020-08-03 MED ORDER — AMOXICILLIN-POT CLAVULANATE 500-125 MG PO TABS
1.0000 | ORAL_TABLET | Freq: Two times a day (BID) | ORAL | Status: DC
  Administered 2020-08-03 – 2020-08-04 (×2): 500 mg via ORAL
  Filled 2020-08-03 (×3): qty 1

## 2020-08-03 MED ORDER — WARFARIN SODIUM 2.5 MG PO TABS
2.5000 mg | ORAL_TABLET | Freq: Once | ORAL | Status: AC
  Administered 2020-08-03: 2.5 mg via ORAL
  Filled 2020-08-03 (×2): qty 1

## 2020-08-03 NOTE — Progress Notes (Deleted)
OT Cancellation Note  Patient Details Name: [name] [name] MRN: [id number] DOB: 17-Mar-1966   Cancelled Treatment:    Reason Eval/Treat Not Completed: (P) Patient at procedure or test/ unavailable  [name] [name],[name] 08/03/2020, 7:36 AM

## 2020-08-03 NOTE — Evaluation (Signed)
Physical Therapy Evaluation Patient Details Name: [name] [name] MRN: [id number] DOB: April 14, 1966 Today's Date: 08/03/2020   History of Present Illness  54 yo male with ESRD on dialysis, COPD, AI, CHF, history of endocarditis, presents with chest pain in afib with RVR - found to have NSTEMI and admitted 07/27/20 Over course of hospitalization has had decreasing BP and HR in presence of chest pain. Diagnosed with new systolic heart failure and bradycardia 10/18 thought to be multi-factorial including autonomic dysfunction, volume depletion and vascular issues making measurment difficult pt transferred to ICU found to have cholecystitis.   Clinical Impression  PTA living in multi story apartment with ramped entrance and bed and bath up 12 stairs. Pt has HHA 3hr/7day/wk to assist with ADLs, iADLs and climbing stairs at night. Pt utilizes scooter for in home mobility as well as for going to HD. Pt is limited in safe mobility by 4/4 DoE with short distance ambulation in presence of decreased strength, balance and endurance. Pt is mod I for transfers and min A for ambulation of 40 feet in RW. PT recommending HHPT at discharge to improve strength and balance back to PLOF. PT will continue to follow acutely.       Follow Up Recommendations Home health PT;Supervision - Intermittent    Equipment Recommendations  None recommended by PT (has equipment)       Precautions / Restrictions Restrictions Weight Bearing Restrictions: No      Mobility  Bed Mobility               General bed mobility comments: longsitting in bed on entry    Transfers Overall transfer level: Modified independent Equipment used: Rolling walker (2 wheeled)             General transfer comment: increased effort and support from RW to come to upright  Ambulation/Gait Ambulation/Gait assistance: Min assist Gait Distance (Feet): 40 Feet Assistive device: Rolling walker (2 wheeled) Gait Pattern/deviations:  Step-through pattern;Decreased step length - left;Decreased weight shift to left;Decreased dorsiflexion - left;Wide base of support;Trunk flexed Gait velocity: slowed   General Gait Details: pt strength decreases with distance and L hip ER with decreased dorsiflexion, vc for managing L foot wirh RW in turning         Balance Overall balance assessment: Needs assistance Sitting-balance support: Feet supported;Feet unsupported;No upper extremity supported Sitting balance-Leahy Scale: Good     Standing balance support: Bilateral upper extremity supported Standing balance-Leahy Scale: Poor                               Pertinent Vitals/Pain Pain Assessment: No/denies pain    Home Living Family/patient expects to be discharged to:: Private residence Living Arrangements: Alone Available Help at Discharge: Personal care attendant Type of Home: Apartment Home Access: Ramped entrance Entrance Stairs-Rails: Right (other hand on wall)   Home Layout: Two level;Bed/bath upstairs Home Equipment: Transport planner      Prior Function Level of Independence: Needs assistance   Gait / Transfers Assistance Needed: uses power scooter  ADL's / Homemaking Assistance Needed: has aide 3 hr/7day/wk  Comments: aide assists with ADLs, iADLs and pt climbing stairs at night     Hand Dominance   Dominant Hand: Right    Extremity/Trunk Assessment   Upper Extremity Assessment Upper Extremity Assessment: Generalized weakness    Lower Extremity Assessment Lower Extremity Assessment: LLE deficits/detail LLE Deficits / Details: L LE weaker than R decreased hip  strength ROM EFL        Communication   Communication: No difficulties  Cognition Arousal/Alertness: Awake/alert Behavior During Therapy: WFL for tasks assessed/performed Overall Cognitive Status: Within Functional Limits for tasks assessed                                        General Comments  General comments (skin integrity, edema, etc.): at rest HR 88bpm with ambulation max noted HR 110bpm, pt with 4/4 DoE at end of ambulation, SaO2 on RA 91%O2        Assessment/Plan    PT Assessment Patient needs continued PT services  PT Problem List Decreased strength;Decreased activity tolerance;Decreased balance;Decreased mobility;Cardiopulmonary status limiting activity       PT Treatment Interventions DME instruction;Gait training;Stair training;Functional mobility training;Therapeutic activities;Therapeutic exercise;Balance training;Cognitive remediation;Patient/family education    PT Goals (Current goals can be found in the Care Plan section)  Acute Rehab PT Goals Patient Stated Goal: get single story apartment PT Goal Formulation: With patient Time For Goal Achievement: 08/17/20 Potential to Achieve Goals: Good    Frequency Min 3X/week    AM-PAC PT "6 Clicks" Mobility  Outcome Measure Help needed turning from your back to your side while in a flat bed without using bedrails?: None Help needed moving from lying on your back to sitting on the side of a flat bed without using bedrails?: None Help needed moving to and from a bed to a chair (including a wheelchair)?: None Help needed standing up from a chair using your arms (e.g., wheelchair or bedside chair)?: None Help needed to walk in hospital room?: A Little Help needed climbing 3-5 steps with a railing? : A Lot 6 Click Score: 21    End of Session Equipment Utilized During Treatment: Gait belt Activity Tolerance: Patient tolerated treatment well Patient left: in bed;with call bell/phone within reach Nurse Communication: Mobility status PT Visit Diagnosis: Unsteadiness on feet (R26.81);Other abnormalities of gait and mobility (R26.89);Muscle weakness (generalized) (M62.81);Difficulty in walking, not elsewhere classified (R26.2)    Time: 1638-4536 PT Time Calculation (min) (ACUTE ONLY): 23 min   Charges:   PT  Evaluation $PT Eval Moderate Complexity: 1 Mod PT Treatments $Gait Training: 8-22 mins        Dawnya Grams B. Migdalia Dk PT, DPT Acute Rehabilitation Services Pager [phone] Office [phone]   [place] 08/03/2020, 4:49 PM

## 2020-08-03 NOTE — Progress Notes (Signed)
PT Cancellation Note  Patient Details Name: Halo Laski MRN: [id number] DOB: March 12, 1966   Cancelled Treatment:    Reason Eval/Treat Not Completed: (P) Patient at procedure or test/unavailable Pt is off the floor for HD. PT will follow back this afternoon for Evaluation as able.  Frimet Durfee B. Migdalia Dk PT, DPT Acute Rehabilitation Services Pager [phone] Office [phone]    [place] 08/03/2020, 8:04 AM

## 2020-08-03 NOTE — Progress Notes (Signed)
Cardiology Progress Note  Patient ID: [name] [name] MRN: [id number] DOB: 1966-10-01 Date of Encounter: 08/03/2020  Primary Cardiologist: Lauree Chandler, MD  Subjective   Chief Complaint: None.  HPI: Blood pressure overnight low with systolics in the 21J per nursing.  I did examine him on hemodialysis today.  Systolic blood pressure is 130.  He reports no chest pain or trouble breathing.  He reports no infectious symptoms such as abdominal pain.  ROS:  All other ROS reviewed and negative. Pertinent positives noted in the HPI.     Inpatient Medications  Scheduled Meds:  aspirin  81 mg Oral Daily   atorvastatin  80 mg Oral Daily   Chlorhexidine Gluconate Cloth  6 each Topical Daily   clopidogrel  75 mg Oral Q breakfast   ferric citrate  210 mg Oral TID WC   insulin aspart  0-15 Units Subcutaneous TID WC   insulin aspart  0-5 Units Subcutaneous QHS   midodrine  10 mg Oral TID WC    morphine injection  0.5 mg Intravenous Once   pantoprazole  40 mg Oral Daily   patiromer  8.4 g Oral Daily   sevelamer carbonate  2,400 mg Oral TID WC   sodium chloride flush  3 mL Intravenous Q12H   Continuous Infusions:  sodium chloride     sodium chloride     sodium chloride Stopped (08/01/20 0641)   sodium chloride     albumin human     piperacillin-tazobactam (ZOSYN)  IV 2.25 g (08/03/20 0624)   PRN Meds: sodium chloride, sodium chloride, [CANCELED] Place/Maintain arterial line **AND** sodium chloride, acetaminophen, clonazePAM, lidocaine (PF), lidocaine-prilocaine, pentafluoroprop-tetrafluoroeth, sodium chloride flush   Vital Signs   Vitals:   08/03/20 0438 08/03/20 0627 08/03/20 0730 08/03/20 0735  BP: (!) 81/37 (!) 97/34 (!) 137/46 (!) 146/39  Pulse: 84  83 82  Resp: 20  19 18   Temp: 98.2 F (36.8 C)  98.3 F (36.8 C)   TempSrc: Oral  Oral   SpO2: 100%     Weight:   135.9 kg   Height:        Intake/Output Summary (Last 24 hours) at 08/03/2020  0848 Last data filed at 08/03/2020 9417 Gross per 24 hour  Intake 997.6 ml  Output --  Net 997.6 ml   Last 3 Weights 08/03/2020 08/01/2020 08/01/2020  Weight (lbs) 299 lb 9.7 oz 289 lb 11 oz 294 lb 1.5 oz  Weight (kg) 135.9 kg 131.4 kg 133.4 kg  Some encounter information is confidential and restricted. Go to Review Flowsheets activity to see all data.     ECG  The most recent ECG shows atrial flutter with nonspecific IVCD, which I personally reviewed.   Physical Exam   Vitals:   08/03/20 0438 08/03/20 0627 08/03/20 0730 08/03/20 0735  BP: (!) 81/37 (!) 97/34 (!) 137/46 (!) 146/39  Pulse: 84  83 82  Resp: 20  19 18   Temp: 98.2 F (36.8 C)  98.3 F (36.8 C)   TempSrc: Oral  Oral   SpO2: 100%     Weight:   135.9 kg   Height:         Intake/Output Summary (Last 24 hours) at 08/03/2020 0848 Last data filed at 08/03/2020 4081 Gross per 24 hour  Intake 997.6 ml  Output --  Net 997.6 ml    Last 3 Weights 08/03/2020 08/01/2020 08/01/2020  Weight (lbs) 299 lb 9.7 oz 289 lb 11 oz 294 lb 1.5 oz  Weight (kg) 135.9  kg 131.4 kg 133.4 kg  Some encounter information is confidential and restricted. Go to Review Flowsheets activity to see all data.    Body mass index is 42.99 kg/m.  General: Well nourished, well developed, in no acute distress Head: Atraumatic, normal size  Eyes: PEERLA, EOMI  Neck: Supple, no JVD Endocrine: No thryomegaly Cardiac: Normal S1, S2; irregular rhythm Lungs: Clear to auscultation bilaterally, no wheezing, rhonchi or rales  Abd: Soft, nontender, no hepatomegaly  Ext: No edema, pulses 2+ Musculoskeletal: No deformities, BUE and BLE strength normal and equal Skin: Warm and dry, no rashes   Neuro: Alert and oriented to person, place, time, and situation, CNII-XII grossly intact, no focal deficits  Psych: Normal mood and affect   Labs  High Sensitivity Troponin:   Recent Labs  Lab 07/27/20 1122 07/27/20 1320 07/30/20 1231 07/30/20 1411   TROPONINIHS >27,000* >27,000* 6,256* 8,935*     Cardiac EnzymesNo results for input(s): TROPONINI in the last 168 hours. No results for input(s): TROPIPOC in the last 168 hours.  Chemistry Recent Labs  Lab 07/30/20 1411 07/31/20 0328 08/01/20 0030 08/01/20 0030 08/01/20 1230 08/03/20 0646 08/03/20 0746  NA  --    < > 137   < > 136 135 134*  K  --    < > 4.5   < > 4.4 4.2 4.2  CL  --    < > 99   < > 98 97* 96*  CO2  --    < > 24   < > 23 26 25   GLUCOSE  --    < > 34*   < > 129* 189* 189*  BUN  --    < > 53*   < > 60* 49* 49*  CREATININE  --    < > 7.96*   < > 9.04* 9.16* 9.14*  CALCIUM  --    < > 8.2*   < > 8.2* 8.2* 8.1*  PROT 6.2*  --   --   --   --   --   --   ALBUMIN 2.8*  --  2.9*  --  2.6*  --  2.7*  AST 40  --   --   --   --   --   --   ALT 26  --   --   --   --   --   --   ALKPHOS 48  --   --   --   --   --   --   BILITOT 1.6*  --   --   --   --   --   --   GFRNONAA  --    < > 7*   < > 6* 6* 6*  ANIONGAP  --    < > 14   < > 15 12 13    < > = values in this interval not displayed.    Hematology Recent Labs  Lab 08/02/20 0248 08/03/20 0223 08/03/20 0646  WBC 6.6 6.6 6.4  RBC 3.28* 3.52* 3.34*  HGB 9.7* 10.2* 9.8*  HCT 31.4* 33.7* 31.7*  MCV 95.7 95.7 94.9  MCH 29.6 29.0 29.3  MCHC 30.9 30.3 30.9  RDW 18.2* 18.5* 18.3*  PLT 258 262 284   BNPNo results for input(s): BNP, PROBNP in the last 168 hours.  DDimer No results for input(s): DDIMER in the last 168 hours.   Radiology  NM Hepato W/EF  Result Date: 08/01/2020 CLINICAL DATA:  Cholelithiasis EXAM: NUCLEAR  MEDICINE HEPATOBILIARY IMAGING WITH GALLBLADDER EF TECHNIQUE: Sequential images of the abdomen were obtained out to 60 minutes following intravenous administration of radiopharmaceutical. After oral ingestion of Ensure, gallbladder ejection fraction was determined. At 60 min, normal ejection fraction is greater than 33%. RADIOPHARMACEUTICALS:  5.15 mCi Tc-71m  Choletec IV COMPARISON:  None FINDINGS:  Normal tracer extraction from bloodstream indicating normal hepatocellular function. Normal excretion of tracer into biliary tree. Gallbladder visualized at 27 min. Small bowel visualized at 24 min. No hepatic retention of tracer. Patient motion during second half of exam. Subjectively decrease of tracer from gallbladder following fatty meal stimulation. Calculated gallbladder ejection fraction is 14%, abnormally low. Patient reported no symptoms following Ensure ingestion. Normal gallbladder ejection fraction following Ensure ingestion is greater than 33% at 1 hour. IMPRESSION: Patent biliary tree. Abnormal gallbladder response to fatty meal stimulation with a decreased gallbladder ejection fraction of 14%. Electronically Signed   By: Lavonia Dana M.D.   On: 08/01/2020 11:30    Cardiac Studies  TTE 07/29/2020 1. Extremely limited; probable distal septal and apical akinesis with  overall mild to moderate LV dysfunction.  2. Left ventricular ejection fraction, by estimation, is 40 to 45%. The  left ventricle has mildly decreased function. The left ventricle  demonstrates regional wall motion abnormalities (see scoring  diagram/findings for description). There is mild left  ventricular hypertrophy. Left ventricular diastolic function could not be  evaluated.  3. Right ventricular systolic function is normal. The right ventricular  size is normal.  4. The mitral valve is normal in structure. No evidence of mitral valve  regurgitation. No evidence of mitral stenosis. Moderate mitral annular  calcification.  5. The aortic valve is tricuspid. Aortic valve regurgitation is not  visualized. No aortic stenosis is present.  6. The inferior vena cava is dilated in size with <50% respiratory  variability, suggesting right atrial pressure of 15 mmHg.   LHC 07/27/2020  Lat Ramus lesion is 100% stenosed.  1st Mrg lesion is 100% stenosed.  Prox LAD lesion is 99% stenosed.  A drug-eluting stent  was successfully placed using a STENT RESOLUTE ONYX 3.5X18.  Post intervention, there is a 0% residual stenosis.   1. Severe proximal to mid LAD stenosis 2. Chronic occlusion of the distal intermediate branch, small in caliber 3. Complete occlusion of the distal segment of the first obtuse marginal branch. Cannot exclude that this was the infarct vessel but the patient was pain free and this vessel did not appear large enough for PCI. There is collateral filling of lateral wall branches from the RCA 4. Successful PTCA/DES x 1 proximal to mid LAD  Recommendations: Will admit to the ICU. Will continue DAPT with ASA and Plavix. Resume coumadin prior to discharge. I would plan to use triple therapy with ASA, Plavix and coumadin for one month then stop ASA. Continue statin. Will not start a beta blocker given his soft BP. Echo tomorrow.    Patient Profile  [name] [name] is a 54 y.o. male with ESRD, COPD, diabetes, chronic atrial flutter, DVT, diastolic heart failure, history of endocarditis who was admitted on 07/27/2020 with non-STEMI.  He underwent PCI to the proximal LAD on 07/28/2020.  Course has been complicated by persistent hypotension with concerns for septic shock.  Procalcitonin's not performed.  Also had bradycardia.  EP declined pacemaker.    Assessment & Plan   1.  Non-STEMI -Status post PCI to the proximal LAD.  Ejection fraction 40 to 45%. -He is on Coumadin at home.  Plan was for him to continue aspirin, Plavix and Coumadin for 1 month and then to drop the aspirin.  He would then continue Plavix and Coumadin for 1 year.  Unclear if he has been tried on Eliquis.  The plan from the week before [place] was to continue Coumadin.  Could consider transition to Eliquis as this is safe in dialysis now. -Cannot tolerate beta-blocker due to bradycardia. -continue lipitor -No ACE/ARB/Arni in the setting of ESRD  2.  Hypotension/acute cholecystitis -Concerns for cholecystitis. -HIDA scan was  negative.  General surgery signed off. -Hypotension has improved.  Lactates were normal.  He did have some hypotension overnight.  He is on midodrine. -We will check pro calcitonin today. -He has been relatively stable we will transition him to oral Augmentin today.  He has completed 2 days of IV therapy.  We will complete an additional 5 days. -As long as hypotension continues to improve he may be able to discharge tomorrow.  I will have physical therapy and Occupational Therapy see him today.  3.  ESRD -Managed per nephrology  4.  Diabetes -Sliding scale insulin while in-house  FEN -No intravenous fluids -DVT PPx: Coumadin -Code: Full -Diet: Renal  For questions or updates, please contact [facility] Please consult [email or web address] for contact info under   Time Spent with Patient: I have spent a total of 25 minutes with patient reviewing hospital notes, telemetry, EKGs, labs and examining the patient as well as establishing an assessment and plan that was discussed with the patient.  > 50% of time was spent in direct patient care.    Signed, Addison Naegeli. Audie Box, [place]  08/03/2020 8:48 AM

## 2020-08-03 NOTE — Progress Notes (Signed)
[facility] Progress Note   Subjective:  Seen on HD, 2.75 L removed on HD today.   Objective Vitals:   08/03/20 1030 08/03/20 1100 08/03/20 1129 08/03/20 1242  BP: (!) 129/39 (!) 158/44 (!) 149/39 98/76  Pulse: 90 91 87 94  Resp: 17 17 18 18   Temp:    (!) 97.4 F (36.3 C)  TempSrc:    Oral  SpO2:    97%  Weight:   133.4 kg   Height:       Physical Exam General: Chronically ill appearing man, NAD. Nasal O2. Heart: RRR; no murmur Lungs: CTA anteriorly and post, no rales or wheezing Abdomen: soft, non-tender Extremities: No LE edema Dialysis Access: RUE AVG + bruit  Additional Objective Labs: Basic Metabolic Panel: Recent Labs  Lab 08/01/20 0030 08/01/20 0030 08/01/20 1230 08/03/20 0646 08/03/20 0746  NA 137   < > 136 135 134*  K 4.5   < > 4.4 4.2 4.2  CL 99   < > 98 97* 96*  CO2 24   < > 23 26 25   GLUCOSE 34*   < > 129* 189* 189*  BUN 53*   < > 60* 49* 49*  CREATININE 7.96*   < > 9.04* 9.16* 9.14*  CALCIUM 8.2*   < > 8.2* 8.2* 8.1*  PHOS 5.5*  --  5.6*  --  4.7*   < > = values in this interval not displayed.   CBC: Recent Labs  Lab 08/01/20 0030 08/01/20 0030 08/01/20 1230 08/01/20 1230 08/02/20 0248 08/03/20 0223 08/03/20 0646  WBC 10.6*   < > 7.8   < > 6.6 6.6 6.4  HGB 10.8*   < > 10.4*   < > 9.7* 10.2* 9.8*  HCT 36.0*   < > 34.4*   < > 31.4* 33.7* 31.7*  MCV 97.0  --  97.2  --  95.7 95.7 94.9  PLT 229   < > 281   < > 258 262 284   < > = values in this interval not displayed.  Medications: . sodium chloride    . sodium chloride    . sodium chloride Stopped (08/01/20 0641)  . sodium chloride    . albumin human     . amoxicillin-clavulanate  1 tablet Oral Q12H  . aspirin  81 mg Oral Daily  . atorvastatin  80 mg Oral Daily  . Chlorhexidine Gluconate Cloth  6 each Topical Daily  . clopidogrel  75 mg Oral Q breakfast  . ferric citrate  210 mg Oral TID WC  . insulin aspart  0-15 Units Subcutaneous TID WC  . insulin aspart  0-5  Units Subcutaneous QHS  . midodrine  10 mg Oral TID WC  .  morphine injection  0.5 mg Intravenous Once  . pantoprazole  40 mg Oral Daily  . patiromer  8.4 g Oral Daily  . sevelamer carbonate  2,400 mg Oral TID WC  . sodium chloride flush  3 mL Intravenous Q12H  . warfarin  2.5 mg Oral ONCE-1600  . Warfarin - Pharmacist Dosing Inpatient   Does not apply q1600    Home meds:  - auryxia 1 ac tid/ phoslo 3 ac tid/ renvela 3 ac tid  - lokelma qd/  veltassa qd  - crestor 20  - insulin toujeo bid/ regular insulin tid ac  - protonix / xyloprim   - clonazepam mg bid prn  - warfarin qd as directed  - prn's/ vitamins/ supplements  Dialysis Orders: TTS - [facility] 4.5h   450/A1.5   129kg   1K/2.25Ca bath   AVG   Hep none  - Hectoral 72mcg Iv q HD - No ESA recently   CXR 10/16 - IMPRESSION: Opacity left base concerning for potential pneumonia. Lungs otherwise clear. There is cardiac enlargement. No adenopathy appreciable.    Assessment/Plan: 1. Hypotension - possibly sepsis from GB infection causing shock. Now is off pressors, moved to floor. Started on midodrine here 10 tid. BP's are normal today 2. R/O acute cholecystitis - +US findings, gen surgery consulted. HIDA scan negative.  IV zosyn D#4. Looks much better.  3. Atrial flutter / bradycardia - bradycardia resolved. HR 80-90's today. Not on any nodal blocking agents.  4. NSTEMI/CAD: s/p LHC with DES placement to proximal LAD 10/16. Had 100% occlusion of 2 smaller branches as well - no intervention options. On DAPT + warfarin. Warfarin on hold in case procedures needed.  5. ESRD/ hx failed renal Tx 2009 - long-term esrd pt. Plan HD today.  6. Hyperkalemia - chronic, on daily Veltassa. K+ high 4's 7. Volume - is up 5-6kg, goal 3 L today  8. Anemia: Hgb 11.3 - no ESA needed for now. 9. Secondary hyperparathyroidism: Ca ok, Phos pending. Continue Auryxia and renvela as binders 10. T2DM: Insulin per primary.   Kelly Splinter, MD 08/03/2020,  1:10 PM

## 2020-08-03 NOTE — Progress Notes (Signed)
Palliative Medicine Inpatient Follow Up Note   Reason for consult:  Goals of Care "please see for GOC, pt w/ long hx esrd, now w/ worsening heart problems, acutely ill on monday but doing better now w/ IV abx, please eval for GOC"  HPI:  Per intake H&P --> [name] [name] is a54 y.o.M with PMH significant for ESRD, heart failure, DM who presented on 10/16 with chest pain. [name] [name] was found to have A-fib and NSTEMI and taken for cath which showed severe proximal to mid-LAD stenosis s/p DESx1 and EF of 35-40%.   Palliative care was asked to get involved to aid in goals of care conversations given worsening heart failure.  Today's Discussion (08/03/2020): Chart reviewed. I met with patient. [name] [name] states that [name] [name] recently returned from dialysis. [name] [name] states that [name] [name] was having some generalized pain yesterday and was not able to review the MOST form or the advance directives. [name] [name] denies pain presently. [name] [name] shares that [name] [name] will review the documents provided and complete them as able.   I asked Draedyn if [name] [name] had thought about our conversation from the day prior. [name] [name] shares that [name] [name] had not given the pain [name] [name] was in. [name] [name] denies having additional questions at this time.   Discussed the importance of continued conversation with family and their  medical providers regarding overall plan of care and treatment options, ensuring decisions are within the context of the patients values and GOCs.  Objective Assessment: Vital Signs Vitals:   08/03/20 1129 08/03/20 1242  BP: (!) 149/39 98/76  Pulse: 87 94  Resp: 18 18  Temp:  (!) 97.4 F (36.3 C)  SpO2:  97%    Intake/Output Summary (Last 24 hours) at 08/03/2020 1305 Last data filed at 08/03/2020 1129 Gross per 24 hour  Intake 607.24 ml  Output 2750 ml  Net -2142.76 ml   Last Weight  Most recent update: 08/03/2020 12:43 PM   Weight  133.4 kg (294 lb 1.5 oz)           Gen:  M in NAD HEENT: moist mucous membranes CV: Regular rate and rhythm, no  murmurs rubs or gallops PULM: On RA, CTA ABD: soft/nontender EXT: (+) trace LE edema Neuro: Alert and oriented x4  SUMMARY OF RECOMMENDATIONS Full code/full scope of care  Patient plans to review and complete MOST form  TOC - OP Palliative Care -given chronic comorbid conditions would benefit from ongoing support to discuss goals of care  Spiritual Support - Create advanced directives & a living will  Time Spent: 15 Greater than 50% of the time was spent in counseling and coordination of care ______________________________________________________________________________________ Michelle Ferolito McKenna Palliative Medicine Team Team Cell Phone: [phone] Please utilize secure chat with additional questions, if there is no response within 30 minutes please call the above phone number  Palliative Medicine Team providers are available by phone from 7am to 7pm daily and can be reached through the team cell phone.  Should this patient require assistance outside of these hours, please call the patient's attending physician.     

## 2020-08-03 NOTE — Progress Notes (Signed)
[facility] ([facility])  Hospital Liaison RN note         Notified by TOC manager of patient/family request for [facility] Palliative services at home after discharge.                 [facility] Palliative team will follow up with patient after discharge.         Please call with any hospice or palliative related questions.         Thank you for the opportunity to participate in this patient's care.     Chrislyn King, BSN, RN [facility] Hospital Liaison (listed on AMION under Hospice[facility])    [phone]     

## 2020-08-03 NOTE — Progress Notes (Signed)
OT Cancellation Note  Patient Details Name: Cleon Signorelli MRN: [id number] DOB: 1966-04-01   Cancelled Treatment:    Reason Eval/Treat Not Completed: (P) Patient at procedure or test/ unavailable (Pt in HD. Will attempt later date)  [facility] 08/03/2020, 7:35 AM  Maurie Boettcher, OT/L   Acute OT Clinical Specialist [place] Pager [phone] Office [phone]

## 2020-08-03 NOTE — Progress Notes (Signed)
PHARMACY NOTE:  ANTIMICROBIAL RENAL DOSAGE ADJUSTMENT  Current antimicrobial regimen includes a mismatch between antimicrobial dosage and estimated renal function.  As per policy approved by the Pharmacy & Therapeutics and Medical Executive Committees, the antimicrobial dosage will be adjusted accordingly.  Current antimicrobial dosage:  Augmentin 500-125mg  QD  Indication: acute cholecystitis   Renal Function:  Estimated Creatinine Clearance: 12.5 mL/min (A) (by C-G formula based on SCr of 9.14 mg/dL (H)). [x]      On intermittent HD, scheduled: TThSa []      On CRRT    Antimicrobial dosage has been changed to:  Augmentin 500-125mg  Q12 hours  Additional comments:    Thank you for allowing pharmacy to be a part of this patient's care.  Dimple Nanas, PharmD PGY-1 Acute Care Pharmacy Resident Office: [phone] 08/03/2020 10:02 AM

## 2020-08-03 NOTE — Plan of Care (Signed)

## 2020-08-03 NOTE — Progress Notes (Signed)
ANTICOAGULATION CONSULT NOTE - Initial Consult  Pharmacy Consult for warfarin Indication: atrial fibrillation  Allergies  Allergen Reactions  . Pork-Derived Products Anaphylaxis and Other (See Comments)    Fever also   . Ace Inhibitors Cough  . Hydrocodone Other (See Comments)    Hallucinations  . Pork Allergy     Other reaction(s): Unknown    Patient Measurements: Height: _0  (177.8 cm) Weight: 135.9 kg (299 lb 9.7 oz) IBW/kg (Calculated) : 73  Vital Signs: Temp: 98.3 F (36.8 C) (10/23 0730) Temp Source: Oral (10/23 0730) BP: 146/39 (10/23 0735) Pulse Rate: 82 (10/23 0735)  Labs: Recent Labs    08/01/20 0030 08/01/20 0030 08/01/20 1230 08/01/20 1230 08/02/20 0248 08/02/20 0248 08/03/20 0223 08/03/20 0646 08/03/20 0746  HGB 10.8*   < > 10.4*   < > 9.7*   < > 10.2* 9.8*  --   HCT 36.0*   < > 34.4*   < > 31.4*  --  33.7* 31.7*  --   PLT 229   < > 281   < > 258  --  262 284  --   LABPROT 22.5*  --   --   --  25.4*  --  22.0*  --   --   INR 2.1*  --   --   --  2.4*  --  2.0*  --   --   CREATININE 7.96*   < > 9.04*  --   --   --   --  9.16* 9.14*   < > = values in this interval not displayed.    Estimated Creatinine Clearance: 12.5 mL/min (A) (by C-G formula based on SCr of 9.14 mg/dL (H)).   Medical History: Past Medical History:  Diagnosis Date  . Allergic rhinitis   . Allergy   . Anemia   . Aortic insufficiency    a. Echo 7/16:  Mod LVH, EF 50-55%, mild to mod AI, severe LAE, PASP 57 mmHg (LV ID end diastolic 27.7 mm);  b. Echo 2/17:  Mod LVH, EF 50-55%, mod AI, MAC, mild MR, mod LAE, mild to mod TR, PASP 63 mmHg  . Bilateral carpal tunnel syndrome   . Cataract    removed both eyes   . CHF (congestive heart failure) ([place])   . Claustrophobia   . COPD (chronic obstructive pulmonary disease) ([place])   . Coronary artery disease   . Diabetes mellitus    Type II  . Diabetic peripheral neuropathy ([place])   . Diabetic retinopathy ([place])   . Discitis of  lumbar region    resolved  . DVT (deep venous thrombosis) ([place]) 10/07/2015   LLE  . Endocarditis 11/28/14  . ESRD (end stage renal disease) on dialysis [facility])    s/p renal transplant in 2009, failed in 2016  . Gait disorder   . Gastroparesis   . GERD (gastroesophageal reflux disease)   . Gout   . Hearing difficulty   . Hemodialysis patient ([place])    tuesday, thursday, saturday   . Hyperlipidemia   . Hypertension   . Incomplete bladder emptying   . Leg pain 02/05/11   with walking  . Morbid obesity ([place])   . Obstructive sleep apnea    not using CPAP- lost 100lbs  . Osteomyelitis ([place])   . Posttraumatic stress disorder   . Rotator cuff disorder   . Secondary hyperparathyroidism ([place])   . Sepsis ([place])    Postive blood cultures -   . Sleep apnea  no cpap  . Urethral stricture   . Vitamin D deficiency     Medications:  Scheduled:  . amoxicillin-clavulanate  1 tablet Oral Daily  . aspirin  81 mg Oral Daily  . atorvastatin  80 mg Oral Daily  . Chlorhexidine Gluconate Cloth  6 each Topical Daily  . clopidogrel  75 mg Oral Q breakfast  . ferric citrate  210 mg Oral TID WC  . insulin aspart  0-15 Units Subcutaneous TID WC  . insulin aspart  0-5 Units Subcutaneous QHS  . midodrine  10 mg Oral TID WC  .  morphine injection  0.5 mg Intravenous Once  . pantoprazole  40 mg Oral Daily  . patiromer  8.4 g Oral Daily  . sevelamer carbonate  2,400 mg Oral TID WC  . sodium chloride flush  3 mL Intravenous Q12H    Assessment: 54 YO male with PMH of atrial flutter, HF, ESRD on dialysis- presenting with chest pain and found to have NSTEMI.  S/p LAD stent. Patient was on PTA warfarin for atrial flutter (dose 78m daily except for 2.5620mon WSa per last amb anticoag note).  Spoke with patient to clarify home dose- stated he sometimes takes half and sometimes take a whole 20m97mablet but that it changes weekly.    Warfarin was on hold for possible GI intervention d/t bleed- ok to restart  10/23 per cardiology. Plan to keep ASA, Plavix, and warfarin x 1 month then d/c ASA. Hgb stable 9-10's today.  INR today 2 which is at goal of 2-3.  Will restart home dose of warfarin as documented on last anticoag visit 9/30.   Goal of Therapy:  INR 2-3 Monitor platelets by anticoagulation protocol: Yes   Plan:  Warfarin 2.20mg29mday Daily INR, CBC Monitor signs/symptoms of bleeding   MaryDimple NanasarmD PGY-1 Acute Care Pharmacy Resident Office: [phone]/2021 9:32 AM

## 2020-08-03 NOTE — Discharge Summary (Addendum)
Discharge Summary    Patient ID: Maverik Foot MRN: [id number]; DOB: Apr 13, 1966  Admit date: 07/27/2020 Discharge date: 08/04/2020  Primary Care Provider: Hoyt Koch, MD  Primary Cardiologist: Lauree Chandler, MD   Primary Electrophysiologist:  None    Discharge Diagnoses    Principal Problem:   NSTEMI (non-ST elevated myocardial infarction) [facility]) Active Problems:   HFrEF (heart failure with reduced ejection fraction) (HCC)   Cholecystitis   Atrial Fibrillation/Flutter   Shock - Cardiogenic vs Septic   Hypotension   CAD (coronary artery disease)   Bradycardia   OSA (obstructive sleep apnea)   Severe obesity (BMI >= 40) ([place])   Diabetes mellitus, type 2 ([place])   Chronic deep vein thrombosis (DVT) of left lower extremity (HCC)   ESRD on dialysis ([place])   Protein calorie malnutrition (HCC)    Diagnostic Studies/Procedures    Cardiac catheterization 07/27/2020 LAD proximal 99 Lateral RI 100-CTO OM1 100 CTO PCI: 3.5 x 18 mm Resolute Onyx DES to the LAD      Echocardiogram 07/29/2020 1. Extremely limited; probable distal septal and apical akinesis with  overall mild to moderate LV dysfunction.  2. Left ventricular ejection fraction, by estimation, is 40 to 45%. The  left ventricle has mildly decreased function. The left ventricle  demonstrates regional wall motion abnormalities (see scoring  diagram/findings for description). There is mild left  ventricular hypertrophy. Left ventricular diastolic function could not be  evaluated.  3. Right ventricular systolic function is normal. The right ventricular  size is normal.  4. The mitral valve is normal in structure. No evidence of mitral valve  regurgitation. No evidence of mitral stenosis. Moderate mitral annular  calcification.  5. The aortic valve is tricuspid. Aortic valve regurgitation is not  visualized. No aortic stenosis is present.  6. The inferior vena cava is dilated in size with  <50% respiratory  variability, suggesting right atrial pressure of 15 mmHg.  _____________   History of Present Illness     [name] [name] is a 54 y.o. male with end-stage renal disease status post failed renal transplant on hemodialysis, diastolic heart failure, aortic insufficiency secondary to history of aortic valve endocarditis in 2016 (patient declined AVR; AI has remained mild to moderate since that time), atrial tachycardia/atrial flutter, chronic Coumadin therapy, prior DVT, diabetes, hypertension, hyperlipidemia, sleep apnea, COPD who presented to the hospital date of admission with chest discomfort.  He ruled in for non-STEMI and was found to be in atrial fibrillation with rapid ventricular rate.  He was admitted for further evaluation and management.    Hospital Course     Consultants:  Nephrology Critical Care Medicine General Surgery Palliative Care  Non-STEMI As noted, the patient presented with chest discomfort.  He had significantly elevated high-sensitivity troponin levels (>27K) and ongoing chest discomfort.  He was taken urgently to the cardiac catheterization lab.  Angiography demonstrated severe proximal to mid LAD stenosis which was treated with a DES.  There was chronic occlusion of the distal intermediate branch which was small in caliber and not amenable to PCI.  There was also complete occlusion of the distal segment of the OM1.  There was collateral filling of the lateral wall branches from the RCA.  It was recommended that medical therapy be instituted for residual CAD.  It was felt that the patient should remain on dual antiplatelet therapy with aspirin and Plavix in addition to warfarin for 30 days, then stop aspirin.  The patient's course was complicated by hypotension, bradycardia and  possible sepsis.  He was not placed on beta-blocker therapy secondary to bradycardia.  He was also not placed on ACE inhibitor/ARB secondary to end-stage renal disease and hypotension.   He was continued on high intensity statin therapy.  Heart failure with reduced ejection fraction  His previous ejection fraction was normal.  Echocardiogram this admission demonstrated reduced LV function with an EF of 40-45%.  Guideline directed medical therapy was limited by hypotension and bradycardia.  He could not be started on beta-blocker, hydralazine, nitrates, ACE inhibitor/ARB.  Volume management was per dialysis.  Persistent atrial fibrillation/flutter Bradycardia The patient remained in atrial fibrillation/flutter during this admission.  He was noted to have a bradycardic event on 07/29/2020 requiring 1 amp of atropine.  He had associated hypotension.  He was managed with dopamine and was ultimately seen by the critical care service.  He was switched to norepinephrine with improved hemodynamics.  His dialysis was held several times due to low blood pressure.  He was placed on midodrine as well to augment blood pressure.    He was seen by electrophysiology (Dr. Rayann Heman) for evaluation of his bradycardia.  Dr. Rayann Heman felt it was not clear if his low blood pressure was related to the bradycardic event.  With end-stage renal disease, it was felt the patient is a poor candidate for pacemaker or EP procedures given high risk of infection.  Pacemaker was not recommended.  His hemodynamics eventually improved and he was weaned off of norepinephrine.  He was continued on midodrine.  Of note, warfarin was held in case surgery was needed for his gallbladder disease (see below).  His warfarin was ultimately resumed and his INR at discharge was 1.6.  His warfarin will be dosed prior to DC.  He will go home on his usual dose. He has been asked to follow up to have this checked again this week.   Hypotension/possible sepsis The etiology of the patient's hypotension was not entirely clear.  It was felt that he may have cardiogenic shock related to his non-STEMI versus possible septic shock.  His lactate  was as high as 3.6.  He was placed on broad-spectrum antibiotics with Zosyn.  He was ultimately transitioned to Augmentin.  Right upper quadrant ultrasound was obtained due to nausea and vomiting.  He was noted to have evidence of cholecystitis.  His abdominal exam was benign and he was eventually seen by general surgery.  HIDA scan demonstrated no cystic obstruction.  There was low gallbladder ejection fraction.  The significance of this was not clear.  In any event, general surgery felt that the patient was a poor candidate for laparoscopic cholecystectomy and did not recommend any further intervention.  Blood cultures remained negative to date.    Cholecystitis He had an abdominal ultrasound to evaluate nausea and vomiting.  This demonstrated cholelithiasis as well as gallbladder wall thickening consistent with cholecystitis.  HIDA scan demonstrated no cystic obstruction and, as noted above, general surgery did not recommend surgical intervention.  He was managed with empiric antibiotics as outlined above.  End-stage renal disease The patient was followed by nephrology who managed his dialysis throughout admission.  Insulin-dependent diabetes mellitus The patient had several episodes of hypoglycemia and his insulin dosages were adjusted.  At discharge, he was placed back on basal insulin 40 units twice daily.  He was asked to use a sliding scale for the next couple of days instead of his usual regular insulin dose.  He was asked to follow-up with his endocrinologist in  the next 2 days for further instructions regarding insulin dosing.  Disposition The patient was seen by palliative medicine to discuss goals of care.  He desired to be a full code and to be discharged to home with home health services.  The patient did request Manufacturing engineer palliative services at home after discharge.  This was arranged.  He was seen today by Dr. Audie Box and felt to be stable for discharge to home.   Did the  patient have an acute coronary syndrome (MI, NSTEMI, STEMI, etc) this admission?:  Yes                               AHA/ACC Clinical Performance & Quality Measures: 1. Aspirin prescribed? - Yes 2. ADP Receptor Inhibitor (Plavix/Clopidogrel, Brilinta/Ticagrelor or Effient/Prasugrel) prescribed (includes medically managed patients)? - Yes 3. Beta Blocker prescribed? - No - bradycardia 4. High Intensity Statin (Lipitor 40-80mg  or Crestor 20-40mg ) prescribed? - Yes 5. EF assessed during THIS hospitalization? - Yes 6. For EF <40%, was ACEI/ARB prescribed? - No - Reason:  ESRD; hypotension 7. For EF <40%, Aldosterone Antagonist (Spironolactone or Eplerenone) prescribed? - No - Reason:  ESRD; hypotension 8. Cardiac Rehab Phase II ordered (including medically managed patients)? - Yes   _____________  Discharge Vitals Blood pressure (!) 140/55, pulse 85, temperature 98.3 F (36.8 C), temperature source Oral, resp. rate 18, height 5\' 10"  (1.778 m), weight 134.5 kg, SpO2 100 %.  Filed Weights   08/03/20 0730 08/03/20 1129 08/04/20 0612  Weight: 135.9 kg 133.4 kg 134.5 kg    Labs & Radiologic Studies    CBC Recent Labs    08/03/20 0646 08/04/20 0257  WBC 6.4 6.7  HGB 9.8* 10.3*  HCT 31.7* 34.2*  MCV 94.9 95.5  PLT 284 280   INR Recent Labs    08/02/20 0248 08/03/20 0223 08/04/20 0257  INR 2.4* 2.0* 1.6*      Basic Metabolic Panel Recent Labs    08/01/20 1230 08/01/20 1230 08/03/20 0646 08/03/20 0746  NA 136   < > 135 134*  K 4.4   < > 4.2 4.2  CL 98   < > 97* 96*  CO2 23   < > 26 25  GLUCOSE 129*   < > 189* 189*  BUN 60*   < > 49* 49*  CREATININE 9.04*   < > 9.16* 9.14*  CALCIUM 8.2*   < > 8.2* 8.1*  PHOS 5.6*  --   --  4.7*   < > = values in this interval not displayed.   Liver Function Tests Recent Labs    08/01/20 1230 08/03/20 0746  ALBUMIN 2.6* 2.7*    High Sensitivity Troponin:   Recent Labs  Lab 07/27/20 1122 07/27/20 1320 07/30/20 1231  07/30/20 1411  TROPONINIHS >27,000* >27,000* 8,742* 8,935*    Hemoglobin A1C Lab Results  Component Value Date   HGBA1C 6.8 (H) 07/28/2020    Infectious Disease  Lab Results  Component Value Date   SARSCOV2NAA NEGATIVE 07/27/2020   INFAP NEGATIVE 07/27/2020   INFBP NEGATIVE 07/27/2020   CULT  07/31/2020    NO GROWTH 3 DAYS Performed at [facility], [place] [address], [place], [place] [address]    CULT  07/31/2020    NO GROWTH 3 DAYS Performed at [place] [address] [place], [place] [address]    REPTSTATUS PENDING 07/31/2020   REPTSTATUS PENDING 07/31/2020  HEPBSAG NON REACTIVE 08/03/2020   HEPBIGM NON REACTIVE 08/03/2020    _____________  DG Abd 1 View  Result Date: 07/30/2020 CLINICAL DATA:  Hypotension. EXAM: ABDOMEN - 1 VIEW COMPARISON:  CT 04/11/2018. FINDINGS: Soft tissue structures are unremarkable. Aortoiliac and visceral including bilateral renal atherosclerotic vascular calcification. No bowel distention or free air. Degenerative changes scoliosis lumbar spine. IMPRESSION: 1.  No acute intra-abdominal abnormality.  No bowel distention. 2. Aortoiliac and visceral including bilateral renal atherosclerotic vascular disease. Electronically Signed   By: Marcello Moores  Register   On: 07/30/2020 15:26    NM Hepato W/EF  Result Date: 08/01/2020 CLINICAL DATA:  Cholelithiasis EXAM: NUCLEAR MEDICINE HEPATOBILIARY IMAGING WITH GALLBLADDER EF TECHNIQUE: Sequential images of the abdomen were obtained out to 60 minutes following intravenous administration of radiopharmaceutical. After oral ingestion of Ensure, gallbladder ejection fraction was determined. At 60 min, normal ejection fraction is greater than 33%. RADIOPHARMACEUTICALS:  5.15 mCi Tc-53m  Choletec IV COMPARISON:  None FINDINGS: Normal tracer extraction from bloodstream indicating normal hepatocellular function. Normal excretion of tracer into biliary tree. Gallbladder visualized at 27 min. Small bowel visualized  at 24 min. No hepatic retention of tracer. Patient motion during second half of exam. Subjectively decrease of tracer from gallbladder following fatty meal stimulation. Calculated gallbladder ejection fraction is 14%, abnormally low. Patient reported no symptoms following Ensure ingestion. Normal gallbladder ejection fraction following Ensure ingestion is greater than 33% at 1 hour. IMPRESSION: Patent biliary tree. Abnormal gallbladder response to fatty meal stimulation with a decreased gallbladder ejection fraction of 14%. Electronically Signed   By: Lavonia Dana M.D.   On: 08/01/2020 11:30   DG Chest Port 1 View  Result Date: 07/30/2020 CLINICAL DATA:  Hypotension. EXAM: PORTABLE CHEST 1 VIEW COMPARISON:  July 27, 2020. FINDINGS: Stable cardiomegaly. No pneumothorax or pleural effusion is noted. Lungs are clear. Bony thorax is unremarkable. IMPRESSION: No active disease. Electronically Signed   By: Marijo Conception M.D.   On: 07/30/2020 15:24   DG Chest Portable 1 View  Result Date: 07/27/2020 CLINICAL DATA:  Chest pain and shortness of breath EXAM: PORTABLE CHEST 1 VIEW COMPARISON:  April 11, 2018 FINDINGS: There is ill-defined airspace opacity in the left base. Lungs are otherwise clear. There is cardiomegaly with pulmonary vascularity within normal limits. No adenopathy. No bone lesions. No pneumothorax IMPRESSION: Opacity left base concerning for potential pneumonia. Lungs otherwise clear. There is cardiac enlargement. No adenopathy appreciable. Electronically Signed   By: Lowella Grip III M.D.   On: 07/27/2020 12:30    US Abdomen Limited RUQ (LIVER/GB)  Result Date: 07/30/2020 CLINICAL DATA:  History of gallstones EXAM: ULTRASOUND ABDOMEN LIMITED RIGHT UPPER QUADRANT COMPARISON:  CT 04/11/2018 FINDINGS: Gallbladder: Small layering stones within the gallbladder. Gallbladder wall is markedly thickened measuring up to 13 mm. Pericholecystic fluid noted around the gallbladder. Tenderness over the  gallbladder during the study. Findings compatible with acute cholecystitis. Common bile duct: Diameter: Not visualized Liver: No focal lesion identified. Within normal limits in parenchymal echogenicity. Portal vein is patent on color Doppler imaging with normal direction of blood flow towards the liver. Other: None. IMPRESSION: Small layering gallstones within the gallbladder. Markedly thickened gallbladder wall with pericholecystic fluid and tenderness over the gallbladder during the study. Findings compatible with acute cholecystitis. Electronically Signed   By: Rolm Baptise M.D.   On: 07/30/2020 18:59   Disposition   Pt is being discharged home today in good condition.  Follow-up Plans & Appointments     Follow-up  Information    [facility] Follow up.   Specialty: PALLIATIVE CARE Why: Outpatient Palliative Care: Office to call your home.  Contact information: [place] [address] [phone]       Burnell Blanks, MD Follow up in 1 week(s).   Specialty: Cardiology Why: The office will call you to arrange Contact information: [place]. [address] [place] [address] [phone]        Hoyt Koch, MD Follow up in 1 week(s).   Specialty: Internal Medicine Why: Please call to arrange follow up Contact information: [place] [address] [phone]        Elayne Snare, MD Follow up in 2 day(s).   Specialty: Endocrinology Why: Please call for early follow up in the next 2 days. Contact information: [place] [address] [place] [address] [phone]              Discharge Instructions    (HEART FAILURE PATIENTS) Call MD:  Anytime you have any of the following symptoms: 1) 3 pound weight gain in 24 hours or 5 pounds in 1 week 2) shortness of breath, with or without a dry hacking cough 3) swelling in the hands, feet or stomach 4) if you have to sleep on extra pillows at  night in order to breathe.   Complete by: As directed    Amb Referral to Cardiac Rehabilitation   Complete by: As directed    Diagnosis:  Coronary Stents NSTEMI     After initial evaluation and assessments completed: Virtual Based Care may be provided alone or in conjunction with Phase 2 Cardiac Rehab based on patient barriers.: Yes   Diet - low sodium heart healthy   Complete by: As directed    Discharge wound care:   Complete by: As directed    Call for any swelling, bleeding or bruising.   Driving Restrictions   Complete by: As directed    No driving for 1 week   Increase activity slowly   Complete by: As directed    Lifting restrictions   Complete by: As directed    No lifting 10 lbs or greater for 2 weeks   Sexual Activity Restrictions   Complete by: As directed    None for 2 weeks      Discharge Medications   Allergies as of 08/04/2020      Reactions   Pork-derived Products Anaphylaxis, Other (See Comments)   Fever also    Ace Inhibitors Cough   Hydrocodone Other (See Comments)   Hallucinations   Pork Allergy    Other reaction(s): Unknown      Medication List    STOP taking these medications   busPIRone 5 MG tablet Commonly known as: BUSPAR   calcium acetate 667 MG capsule Commonly known as: PHOSLO   loperamide 2 MG capsule Commonly known as: IMODIUM   mupirocin ointment 2 % Commonly known as: BACTROBAN   omeprazole 20 MG capsule Commonly known as: PRILOSEC Replaced by: pantoprazole 40 MG tablet   rosuvastatin 20 MG tablet Commonly known as: CRESTOR     TAKE these medications   Accu-Chek FastClix Lancets Misc USE TO CHECK BLOOD SUGARS 4 TIMES A DAY. What changed:   how much to take  how to take this  when to take this   Accu-Chek Guide test strip Generic drug: glucose blood 1 each by Other route as needed for other. Use as instructed to check blood  sugar 3 times daily. DX:E11.65   acetaminophen 325 MG tablet Commonly known as:  TYLENOL Take 2 tablets (650 mg total) by mouth every 4 (four) hours as needed for headache or mild pain.   allopurinol 100 MG tablet Commonly known as: ZYLOPRIM Take 1 tablet (100 mg total) by mouth 3 (three) times a week. After dialysis   amoxicillin-clavulanate 500-125 MG tablet Commonly known as: AUGMENTIN Take 1 tablet (500 mg total) by mouth every 12 (twelve) hours for 4 days.   aspirin 81 MG chewable tablet Chew 1 tablet (81 mg total) by mouth daily.   atorvastatin 80 MG tablet Commonly known as: LIPITOR Take 1 tablet (80 mg total) by mouth daily.   Auryxia 1 GM 210 MG(Fe) tablet Generic drug: ferric citrate Take 210 mg by mouth 3 (three) times daily with meals.   clonazePAM 0.5 MG tablet Commonly known as: KLONOPIN Take 1 tablet (0.5 mg total) by mouth 2 (two) times daily as needed for anxiety.   clopidogrel 75 MG tablet Commonly known as: PLAVIX Take 1 tablet (75 mg total) by mouth daily with breakfast.   docusate sodium 100 MG capsule Commonly known as: Stool Softener Take 1 capsule (100 mg total) by mouth 2 (two) times daily.   Droplet Pen Needles 32G X 4 MM Misc Generic drug: Insulin Pen Needle USE  TO INJECT FIVE TIMES DAILY AS DIRECTED What changed: See the new instructions.   Insulin Pen Needle 32G X 8 MM Misc Use with insulin x5 times a day What changed:   how much to take  how to take this  when to take this   HumuLIN R U-500 KwikPen 500 UNIT/ML kwikpen Generic drug: insulin regular human CONCENTRATED Use sliding scale provided - Call your endocrinologist Monday 08/05/2020 for further instructions. What changed: additional instructions   hydrocortisone 1 % ointment Apply 1 application topically 2 (two) times daily.   hydrocortisone-pramoxine rectal foam Commonly known as: PROCTOFOAM-HC Place 1 applicator rectally 2 (two) times daily.   Lokelma 10 g Pack packet Generic drug: sodium zirconium cyclosilicate Take 10 g by mouth daily.     midodrine 10 MG tablet Commonly known as: PROAMATINE Take 1 tablet (10 mg total) by mouth 3 (three) times daily with meals.   pantoprazole 40 MG tablet Commonly known as: PROTONIX Take 1 tablet (40 mg total) by mouth daily. Replaces: omeprazole 20 MG capsule   Renvela 800 MG tablet Generic drug: sevelamer carbonate Take 2,400 mg by mouth 3 (three) times daily with meals.   Toujeo Max SoloStar 300 UNIT/ML Solostar Pen Generic drug: insulin glargine (2 Unit Dial) Inject 40 units in the morning and 40 units in the evening. What changed: See the new instructions.   Veltassa 8.4 g packet Generic drug: patiromer Take 8.4 g by mouth daily.   warfarin 5 MG tablet Commonly known as: COUMADIN Take as directed. If you are unsure how to take this medication, talk to your nurse or doctor. Original instructions: TAKE 1 TABLET EVERY DAY OR AS DIRECTED BY COUMADIN CLINIC. What changed: See the new instructions.            Discharge Care Instructions  (From admission, onward)         Start     Ordered   08/04/20 0000  Discharge wound care:       Comments: Call for any swelling, bleeding or bruising.   08/04/20 0914             Outstanding Labs/Studies  Pt needs to have INR checked this week with his primary care provider.  Duration of Discharge Encounter   Greater than 30 minutes including physician time.  Signed, Richardson Dopp, PA-C 08/04/2020, 9:39 AM

## 2020-08-04 DIAGNOSIS — Z515 Encounter for palliative care: Secondary | ICD-10-CM | POA: Diagnosis not present

## 2020-08-04 DIAGNOSIS — I214 Non-ST elevation (NSTEMI) myocardial infarction: Secondary | ICD-10-CM | POA: Diagnosis not present

## 2020-08-04 LAB — CBC
HCT: 34.2 % — ABNORMAL LOW (ref 39.0–52.0)
Hemoglobin: 10.3 g/dL — ABNORMAL LOW (ref 13.0–17.0)
MCH: 28.8 pg (ref 26.0–34.0)
MCHC: 30.1 g/dL (ref 30.0–36.0)
MCV: 95.5 fL (ref 80.0–100.0)
Platelets: 280 10*3/uL (ref 150–400)
RBC: 3.58 MIL/uL — ABNORMAL LOW (ref 4.22–5.81)
RDW: 18 % — ABNORMAL HIGH (ref 11.5–15.5)
WBC: 6.7 10*3/uL (ref 4.0–10.5)
nRBC: 0 % (ref 0.0–0.2)

## 2020-08-04 LAB — GLUCOSE, CAPILLARY
Glucose-Capillary: 258 mg/dL — ABNORMAL HIGH (ref 70–99)
Glucose-Capillary: 267 mg/dL — ABNORMAL HIGH (ref 70–99)

## 2020-08-04 LAB — PROTIME-INR
INR: 1.6 — ABNORMAL HIGH (ref 0.8–1.2)
Prothrombin Time: 18.6 seconds — ABNORMAL HIGH (ref 11.4–15.2)

## 2020-08-04 MED ORDER — ASPIRIN 81 MG PO CHEW: 81.0000 mg | CHEWABLE_TABLET | Freq: Every day | ORAL | 0 refills | Status: AC

## 2020-08-04 MED ORDER — WARFARIN SODIUM 5 MG PO TABS: 5.0000 mg | ORAL_TABLET | Freq: Once | ORAL | Status: DC

## 2020-08-04 MED ORDER — HYDROMORPHONE HCL 1 MG/ML IJ SOLN: 1.0000 mg | Freq: Once | INTRAMUSCULAR | Status: DC

## 2020-08-04 MED ORDER — MIDODRINE HCL 10 MG PO TABS: 10.0000 mg | ORAL_TABLET | Freq: Three times a day (TID) | ORAL | 11 refills | Status: DC

## 2020-08-04 MED ORDER — ATORVASTATIN CALCIUM 80 MG PO TABS: 80.0000 mg | ORAL_TABLET | Freq: Every day | ORAL | 3 refills | Status: DC

## 2020-08-04 MED ORDER — LIDOCAINE VISCOUS HCL 2 % MT SOLN
15.0000 mL | Freq: Once | OROMUCOSAL | Status: AC
  Administered 2020-08-04: 15 mL via ORAL
  Filled 2020-08-04: qty 15

## 2020-08-04 MED ORDER — AMOXICILLIN-POT CLAVULANATE 500-125 MG PO TABS: 1.0000 | ORAL_TABLET | Freq: Two times a day (BID) | ORAL | 0 refills | Status: AC

## 2020-08-04 MED ORDER — DOCUSATE SODIUM 100 MG PO CAPS: 100.0000 mg | ORAL_CAPSULE | Freq: Two times a day (BID) | ORAL | 1 refills | Status: DC

## 2020-08-04 MED ORDER — ALUM & MAG HYDROXIDE-SIMETH 200-200-20 MG/5ML PO SUSP
30.0000 mL | Freq: Once | ORAL | Status: AC
  Administered 2020-08-04: 30 mL via ORAL
  Filled 2020-08-04: qty 30

## 2020-08-04 MED ORDER — CLOPIDOGREL BISULFATE 75 MG PO TABS: 75.0000 mg | ORAL_TABLET | Freq: Every day | ORAL | 3 refills | Status: DC

## 2020-08-04 MED ORDER — TOUJEO MAX SOLOSTAR 300 UNIT/ML ~~LOC~~ SOPN: PEN_INJECTOR | SUBCUTANEOUS | 1 refills | Status: DC

## 2020-08-04 MED ORDER — ACETAMINOPHEN 325 MG PO TABS: 650.0000 mg | ORAL_TABLET | ORAL | Status: DC | PRN

## 2020-08-04 MED ORDER — PANTOPRAZOLE SODIUM 40 MG PO TBEC: 40.0000 mg | DELAYED_RELEASE_TABLET | Freq: Every day | ORAL | 11 refills | Status: DC

## 2020-08-04 MED ORDER — OXYCODONE HCL 5 MG PO TABS
5.0000 mg | ORAL_TABLET | Freq: Four times a day (QID) | ORAL | Status: DC | PRN
  Administered 2020-08-04: 5 mg via ORAL
  Filled 2020-08-04: qty 1

## 2020-08-04 MED ORDER — HUMULIN R U-500 KWIKPEN 500 UNIT/ML ~~LOC~~ SOPN: PEN_INJECTOR | SUBCUTANEOUS | 0 refills | Status: DC

## 2020-08-04 NOTE — Evaluation (Signed)
Occupational Therapy Evaluation Patient Details Name: [name] [name] MRN: [id number] DOB: Mar 03, 1966 Today's Date: 08/04/2020    History of Present Illness 54 yo male with ESRD on dialysis, COPD, AI, CHF, history of endocarditis, presents with chest pain in afib with RVR - found to have NSTEMI and admitted 07/27/20 Over course of hospitalization has had decreasing BP and HR in presence of chest pain. Diagnosed with new systolic heart failure and bradycardia 10/18 thought to be multi-factorial including autonomic dysfunction, volume depletion and vascular issues making measurment difficult pt transferred to ICU found to have cholecystitis.    Clinical Impression   This 54 yo male admitted with above presents to acute OT with PLOF of needing A for his basic ADLs and IADLs (has an aide 7 days/week 3 hours/day. He currently feels like he is able to manage at this level for D/C today. No further OT needs, we will sign off.    Follow Up Recommendations  No OT follow up;Supervision - Intermittent    Equipment Recommendations  None recommended by OT       Precautions / Restrictions Precautions Precautions: Fall Restrictions Weight Bearing Restrictions: No      Mobility Bed Mobility               General bed mobility comments: sitting on EOB getting ready to eat lunch       Balance   Sitting-balance support: No upper extremity supported;Feet supported Sitting balance-Leahy Scale: Good                                     ADL either performed or assessed with clinical judgement   ADL                                         General ADL Comments: Pt reports he can toliet himself if his aid is not there, but aid is usually there when he has to go for a bowel movement and he does not urinate (HD). He reports the aid helps him in and out of the tub to seat, helps him bath and helps him dress (every day). He reports the aid preps meals for him and  then he can heat them up himself for his power scooter he uses downstairs. If he is up walking he reports he holds onto things (furniture, walls)     Vision Patient Visual Report: No change from baseline              Pertinent Vitals/Pain Pain Assessment: No/denies pain     Hand Dominance Right   Extremity/Trunk Assessment Upper Extremity Assessment Upper Extremity Assessment: Overall WFL for tasks assessed           Communication Communication Communication: No difficulties   Cognition Arousal/Alertness: Awake/alert Behavior During Therapy: WFL for tasks assessed/performed Overall Cognitive Status: Within Functional Limits for tasks assessed                                                Home Living Family/patient expects to be discharged to:: Private residence Living Arrangements: Alone Available Help at Discharge: Personal care attendant Type of Home: Apartment Home Access: Ramped entrance   Entrance  Stairs-Rails: Right (other hand on wall) Home Layout: Two level;Bed/bath upstairs Alternate Level Stairs-Number of Steps: 12 Alternate Level Stairs-Rails: Right (uses wall on other side for stabilty) Bathroom Shower/Tub: Teacher, early years/pre: Standard     Home Equipment: Transport planner;Shower seat          Prior Functioning/Environment Level of Independence: Needs assistance  Gait / Transfers Assistance Needed: uses power scooter ADL's / Homemaking Assistance Needed: has aide 3 hr/7day/wk   Comments: aide assists with ADLs, iADLs and pt climbing stairs at night        OT Problem List: Impaired balance (sitting and/or standing) (standing)         OT Goals(Current goals can be found in the care plan section) Acute Rehab OT Goals Patient Stated Goal: to go home today and hopeful for a single story apartment  OT Frequency:                AM-PAC OT "6 Clicks" Daily Activity     Outcome Measure Help from another  person eating meals?: None Help from another person taking care of personal grooming?: None (from seated position) Help from another person toileting, which includes using toliet, bedpan, or urinal?: A Little (says he can do it himself, but aid is usually there) Help from another person bathing (including washing, rinsing, drying)?: A Lot (Aid A) Help from another person to put on and taking off regular upper body clothing?: A Little (Aid A) Help from another person to put on and taking off regular lower body clothing?: A Lot (Aid A) 6 Click Score: 18   End of Session    Activity Tolerance: Patient tolerated treatment well Patient left: in bed;with call bell/phone within reach  OT Visit Diagnosis: Other abnormalities of gait and mobility (R26.89)                Time: 5102-5852 OT Time Calculation (min): 9 min Charges:  OT General Charges $OT Visit: 1 Visit OT Evaluation $OT Eval Low Complexity: 1 Low  [place], OTR/L Acute [employer] Pager [phone] Office [phone]     Almon Register 08/04/2020, 11:44 AM

## 2020-08-04 NOTE — Discharge Instructions (Signed)
**For your Insulin:** You had very low sugars during your hospitalization.  For the last few days, you have only been getting a sliding scale insulin.  This is much lower than what you were previously getting at home.  To avoid low blood sugars, follow these instructions.  Your sugars will likely go up in the next 1-2 days and you will need to resume your prior dosing.  However, you should see or talk to your endocrinologist in the next 2 days to discuss your insulin dose.  Take a half dose of your Toujeo 40 units twice daily for now.  For your Humulin R U-500, instead of your usual dose, use the sliding scale below today and tomorrow.  Check your sugar at meal time and bedtime  You need to call or see your endocrinologist (Dr. Dwyane Dee) Monday 08/05/2020 to discuss further instructions for your insulin.  I have sent him a message and your discharge notes.                       Daytime Blood Sugar                 Insulin Dose < 121                            0 units 121-150                        2 units 151-200                        3 units   201-250                        5 units 251-300                        8 units 301-350                        11 units 351-400                        15 units > 400                            20 units AND call your endocrinologist                      Bedtime Blood Sugar                Insulin Dose < 201                          0 units 201-250                      2 units 251-300                      3 units  301-350                      4 units  351-400                      5 units    >  400                          7 units AND call your endocrinologist   For Acid Reflux Omeprazole interferes with Clopidogrel (Plavix) which keeps the stent open. The Omeprazole (Prilosec) has been replaced by Pantoprazole (Protonix).  For your warfarin (coumadin) Take a 5 mg dose tonight.  Make sure it is checked in the next 2-3 days to make sure it is at  goal.  Aspirin Take the aspirin for the next 30 days.  Your last day on Aspirin will be 09/03/2020.  Clopidogrel (Plavix) Do NOT stop this medication.  It is very important to keep the stent open.

## 2020-08-04 NOTE — Progress Notes (Signed)
[facility] Progress Note   Subjective:  Seen in room, doing well, going home today. Knows to keep fluid intake down.   Objective Vitals:   08/04/20 0738 08/04/20 0900 08/04/20 1133 08/04/20 1210  BP: (!) 121/46 (!) 140/55 133/67 (!) 105/94  Pulse: 81 85 83 86  Resp: 18 18 18 18   Temp: 98.5 F (36.9 C) 98.3 F (36.8 C) 98.2 F (36.8 C)   TempSrc: Oral Oral Oral   SpO2: 98% 100% 99%   Weight:      Height:       Physical Exam General: Chronically ill appearing man, NAD. Nasal O2. Heart: RRR; no murmur Lungs: CTA anteriorly and post, no rales or wheezing Abdomen: soft, non-tender Extremities: 1+ pretib edema Dialysis Access: RUE AVG + bruit  Additional Objective Labs: Basic Metabolic Panel: Recent Labs  Lab 08/01/20 0030 08/01/20 0030 08/01/20 1230 08/03/20 0646 08/03/20 0746  NA 137   < > 136 135 134*  K 4.5   < > 4.4 4.2 4.2  CL 99   < > 98 97* 96*  CO2 24   < > 23 26 25   GLUCOSE 34*   < > 129* 189* 189*  BUN 53*   < > 60* 49* 49*  CREATININE 7.96*   < > 9.04* 9.16* 9.14*  CALCIUM 8.2*   < > 8.2* 8.2* 8.1*  PHOS 5.5*  --  5.6*  --  4.7*   < > = values in this interval not displayed.   CBC: Recent Labs  Lab 08/01/20 1230 08/01/20 1230 08/02/20 0248 08/02/20 0248 08/03/20 0223 08/03/20 0646 08/04/20 0257  WBC 7.8   < > 6.6   < > 6.6 6.4 6.7  HGB 10.4*   < > 9.7*   < > 10.2* 9.8* 10.3*  HCT 34.4*   < > 31.4*   < > 33.7* 31.7* 34.2*  MCV 97.2  --  95.7  --  95.7 94.9 95.5  PLT 281   < > 258   < > 262 284 280   < > = values in this interval not displayed.  Medications: . sodium chloride    . sodium chloride    . sodium chloride Stopped (08/01/20 0641)  . sodium chloride    . albumin human     . amoxicillin-clavulanate  1 tablet Oral Q12H  . aspirin  81 mg Oral Daily  . atorvastatin  80 mg Oral Daily  . Chlorhexidine Gluconate Cloth  6 each Topical Daily  . clopidogrel  75 mg Oral Q breakfast  . ferric citrate  210 mg Oral TID WC   . insulin aspart  0-15 Units Subcutaneous TID WC  . insulin aspart  0-5 Units Subcutaneous QHS  . midodrine  10 mg Oral TID WC  .  morphine injection  0.5 mg Intravenous Once  . pantoprazole  40 mg Oral Daily  . patiromer  8.4 g Oral Daily  . sevelamer carbonate  2,400 mg Oral TID WC  . sodium chloride flush  3 mL Intravenous Q12H  . warfarin  5 mg Oral ONCE-1600  . Warfarin - Pharmacist Dosing Inpatient   Does not apply q1600    Home meds:  - auryxia 1 ac tid/ phoslo 3 ac tid/ renvela 3 ac tid  - lokelma qd/  veltassa qd  - crestor 20  - insulin toujeo bid/ regular insulin tid ac  - protonix / xyloprim   - clonazepam mg bid prn  - warfarin qd  as directed  - prn's/ vitamins/ supplements    Dialysis Orders: TTS - [facility] 4.5h   450/A1.5   129kg   1K/2.25Ca bath   AVG   Hep none  - Hectoral 37mcg Iv q HD - No ESA recently   CXR 10/16 - IMPRESSION: Opacity left base concerning for potential pneumonia. Lungs otherwise clear. There is cardiac enlargement. No adenopathy appreciable.    Assessment/Plan: 1. Hypotension/ severe bradycardia - possibly sepsis from GB infection causing shock. Resolved w/ IV abx. HIDA was neg. No recurrence of shock or bradycardia. Normotensive now, HR 80's. Started on midodrine here 10 tid, may have to lower or dc in OP setting.  2. Possible acute cholecystitis - +US findings, gen surgery consulted. HIDA scan negative. No surgery recommended.  Got IV abx and will be dc'd on po augmentin to complete 7d total.  3. Atrial flutter / bradycardia - bradycardia resolved. HR 80-90's today. Not on any nodal blocking agents. DC to resume him home warfarin.  4. NSTEMI/CAD: s/p LHC with DES placement to proximal LAD 10/16. Had 100% occlusion of 2 smaller branches as well - no intervention options. On DAPT + warfarin.  5. ESRD/ hx failed renal Tx 2009 - long-term esrd pt. Plan HD today.  6. Hyperkalemia - chronic, on daily Veltassa. K+ high 4's 7. Volume - is 4kg  today, no resp issues.  8. Anemia: Hgb 11.3 - no ESA needed for now. 9. Secondary hyperparathyroidism: Ca ok, Phos pending. Continue Auryxia and renvela as binders 10. T2DM: Insulin per primary.   Kelly Splinter, MD 08/04/2020, 1:24 PM

## 2020-08-04 NOTE — Progress Notes (Signed)
   Palliative Medicine Inpatient Follow Up Note   Reason for consult:  Goals of Care "please see for [place], pt w/ long hx esrd, now w/ worsening heart problems, acutely ill on monday but doing better now w/ IV abx, please eval for GOC"  HPI:  Per intake H&P --> [name] [name] is a1 y.o.M with PMH significant for ESRD, heart failure, DM who presented on 10/16 with chest pain. He was found to have A-fib and NSTEMI and taken for cath which showed severe proximal to mid-LAD stenosis s/p DESx1 and EF of 35-40%.   Palliative care was asked to get involved to aid in goals of care conversations given worsening heart failure.  Today's Discussion (08/04/2020): Chart reviewed. I met with patient this morning. I asked [name] [name] if he had time to look of the MOST form and advanced directives. He shares with me that he was "just about to do that". He took my phone number and shares with me that he will call me when he is done. Irregardless, I shared that I have requested for outpatient palliative care to follow-up to continue with goals of care conversations. He is aware an in agreement with this.  Discussed the importance of continued conversation with family and their  medical providers regarding overall plan of care and treatment options, ensuring decisions are within the context of the patients values and GOCs.  Objective Assessment: Vital Signs Vitals:   08/04/20 0738 08/04/20 0900  BP: (!) 121/46 (!) 140/55  Pulse: 81 85  Resp: 18 18  Temp: 98.5 F (36.9 C) 98.3 F (36.8 C)  SpO2: 98% 100%    Intake/Output Summary (Last 24 hours) at 08/04/2020 1130 Last data filed at 08/03/2020 1630 Gross per 24 hour  Intake 480 ml  Output --  Net 480 ml   Last Weight  Most recent update: 08/04/2020  6:13 AM   Weight  134.5 kg (296 lb 9.6 oz)           Gen:  M in NAD HEENT: moist mucous membranes CV: Regular rate and rhythm, no murmurs rubs or gallops PULM: On RA, CTA ABD: soft/nontender EXT:  (+) trace LE edema Neuro: Alert and oriented x4  SUMMARY OF RECOMMENDATIONS Full code/full scope of care  Patient plans to review and complete MOST form  [facility] will follow up with patient  Spiritual Support - Create advanced directives & a living will  Plan for discharge home with home health  Time Spent: 15 Greater than 50% of the time was spent in counseling and coordination of care ______________________________________________________________________________________ [facility] [place] Team Team Cell Phone: [phone] Please utilize secure chat with additional questions, if there is no response within 30 minutes please call the above phone number  Palliative Medicine Team providers are available by phone from 7am to 7pm daily and can be reached through the team cell phone.  Should this patient require assistance outside of these hours, please call the patient's attending physician.

## 2020-08-04 NOTE — Progress Notes (Signed)
Pt awaiting ride. D/c instruction given. Pt verbalized understanding. Dicussed HF education and dietary restriction.  Encouraged to gradually increase. activity and to start adding walking to his daily regime  Pt eager and ready to start doing better. Condition stable. No changes to initial AM assessment

## 2020-08-04 NOTE — Progress Notes (Signed)
ANTICOAGULATION CONSULT NOTE - Initial Consult  Pharmacy Consult for warfarin Indication: atrial fibrillation  Allergies  Allergen Reactions  . Pork-Derived Products Anaphylaxis and Other (See Comments)    Fever also   . Ace Inhibitors Cough  . Hydrocodone Other (See Comments)    Hallucinations  . Pork Allergy     Other reaction(s): Unknown    Patient Measurements: Height: _0  (177.8 cm) Weight: 134.5 kg (296 lb 9.6 oz) IBW/kg (Calculated) : 73  Vital Signs: Temp: 98.3 F (36.8 C) (10/24 0900) Temp Source: Oral (10/24 0900) BP: 140/55 (10/24 0900) Pulse Rate: 85 (10/24 0900)  Labs: Recent Labs    08/01/20 1230 08/01/20 1230 08/02/20 0248 08/02/20 0248 08/03/20 0223 08/03/20 0223 08/03/20 0646 08/03/20 0746 08/04/20 0257  HGB 10.4*   < > 9.7*   < > 10.2*   < > 9.8*  --  10.3*  HCT 34.4*   < > 31.4*   < > 33.7*  --  31.7*  --  34.2*  PLT 281   < > 258   < > 262  --  284  --  280  LABPROT  --   --  25.4*  --  22.0*  --   --   --  18.6*  INR  --   --  2.4*  --  2.0*  --   --   --  1.6*  CREATININE 9.04*  --   --   --   --   --  9.16* 9.14*  --    < > = values in this interval not displayed.    Estimated Creatinine Clearance: 12.5 mL/min (A) (by C-G formula based on SCr of 9.14 mg/dL (H)).   Medical History: Past Medical History:  Diagnosis Date  . Allergic rhinitis   . Allergy   . Anemia   . Aortic insufficiency    SBE in 2016 >> pt declined AVR // a. Echo 7/16:  Mod LVH, EF 50-55%, mild to mod AI, severe LAE, PASP 57 mmHg (LV ID end diastolic 27.5 mm);  b. Echo 2/17:  Mod LVH, EF 50-55%, mod AI, MAC, mild MR, mod LAE, mild to mod TR, PASP 63 mmHg // Echo 2/21: normal EF, mild MR, mild AI  . Atrial fibrillation and flutter ([place])    chronic anticoag with Warfarin // bradycardia noted during admit 10/21  . Bilateral carpal tunnel syndrome   . Bradycardia    brady/low BP event during admit in 10/21 >> poor candidate for pacer/EPS due to high risk of  infection with ESRD; pacer not recommended by EP  . CAD (coronary artery disease)    s/p NSTEMI 10/21: cath>> pLAD 99 >> tx with DES; Lat RI 100 and OM1 100 - med Rx  . Cataract    removed both eyes   . Cholecystitis    dx during admx 10/21 >> HIDA neg // exam benign // high risk for surgery >> no surgical intervention recommended.   . Chronic systolic CHF (congestive heart failure) ([place])    Ischemic CM // Echo 10/21: EF 40-45, dist sept and apical AK, mild LVH, normal RVSF, mod MAC  . Claustrophobia   . COPD (chronic obstructive pulmonary disease) ([place])   . Coronary artery disease   . Diabetes mellitus    Type II  . Diabetic peripheral neuropathy ([place])   . Diabetic retinopathy ([place])   . Discitis of lumbar region    resolved  . DVT (deep venous thrombosis) ([place]) 10/07/2015  LLE  . Endocarditis 11/28/14  . ESRD (end stage renal disease) on dialysis [facility])    s/p renal transplant in 2009, failed in 2016  . Gait disorder   . Gastroparesis   . GERD (gastroesophageal reflux disease)   . Gout   . Hearing difficulty   . Hemodialysis patient ([place])    tuesday, thursday, saturday   . Hyperlipidemia   . Hypertension   . Incomplete bladder emptying   . Leg pain 02/05/11   with walking  . Morbid obesity ([place])   . Obstructive sleep apnea    not using CPAP- lost 100lbs  . Osteomyelitis ([place])   . Posttraumatic stress disorder   . Rotator cuff disorder   . Secondary hyperparathyroidism ([place])   . Sepsis ([place])    Postive blood cultures -   . Sleep apnea    no cpap  . Urethral stricture   . Vitamin D deficiency     Medications:  Scheduled:  . amoxicillin-clavulanate  1 tablet Oral Q12H  . aspirin  81 mg Oral Daily  . atorvastatin  80 mg Oral Daily  . Chlorhexidine Gluconate Cloth  6 each Topical Daily  . clopidogrel  75 mg Oral Q breakfast  . ferric citrate  210 mg Oral TID WC  . insulin aspart  0-15 Units Subcutaneous TID WC  . insulin aspart  0-5 Units Subcutaneous QHS  .  midodrine  10 mg Oral TID WC  .  morphine injection  0.5 mg Intravenous Once  . pantoprazole  40 mg Oral Daily  . patiromer  8.4 g Oral Daily  . sevelamer carbonate  2,400 mg Oral TID WC  . sodium chloride flush  3 mL Intravenous Q12H  . Warfarin - Pharmacist Dosing Inpatient   Does not apply q1600    Assessment: 54 yo male with a hx of atrial flutter, CHF, PAD, endocarditis, GERD, COPD, T2DM, HTN, HLD, gout, prior DVT, failed renal transplant and ESRD on dialysis  presented with chest pain and wasfound to have NSTEMI. Pt is s/p LAD stent.  PTA the patient was on warfarin for atrial flutter (dose 5 mg daily except for 2.5 mg on Wed/Sat per last outpatient anticoagulation visit note). Previous pharmacist spoke with the patient to clarify the home dose and pt stated he sometimes takes half and sometimes take a whole 5 mg tablet but that it changes weekly.    Warfarin was on hold for possible GI intervention d/t bleed but per cardiology on 10/23, it is okay to restart. The pt is to keep bASA, clopidogrel, and warfarin x 1 month then stop bASA.  The patient received 2.5 mg warfarin 10/23 and INR today on 10/24 is subtherapeutic at 1.6. Hgb/Hct/platelets remain stable, no signs of symptoms of bleeding have been reported. No new interacting medications were started and dietary intake remains consistent.   Goal of Therapy:  INR 2-3 Monitor platelets by anticoagulation protocol: Yes   Plan:  Give warfarin 5 mg PO tonight  Daily PT/INR and CBC Monitor for signs and symptoms of bleeding   Shauna Hugh, PharmD, [place]  PGY-1 Pharmacy Resident 08/04/2020 9:26 AM  Please check [email or web address] for unit-specific pharmacy phone numbers.

## 2020-08-04 NOTE — TOC Initial Note (Signed)
Transition of Care [facility]) - Initial/Assessment Note    Patient Details  Name: [name] [name] MRN: [id number] Date of Birth: [birth year]  Transition of Care [facility]) CM/SW Contact:    Bethena Roys, RN Phone Number: 08/04/2020, 9:54 AM  Clinical Narrative: Risk for readmission assessment completed. Prior to arrival patient was from home alone. Patient currently lives in an apartment and has transportation to appointments and HD on TTS. Patient has personal care services that he receives during the week. Patient gets medications from [facility] order and if he needs a local pharmacy he uses [facility] [facility] and [employer]. Patient uses a Estate manager/land agent and has additional equipment. Case Manager discussed home health services and provided patient with a Medicare.gov list. Patient chose [facility] and referral made for Registered Nurse for diabetes management and Physical Therapy for evaluation and treatment. Start of care to begin within 24-48 hours post transition home. Patient has transportation home-plan is to transition home today.                 Expected Discharge Plan: [place] Barriers to Discharge: No Barriers Identified   Patient Goals and CMS Choice Patient states their goals for this hospitalization and ongoing recovery are:: to return home. CMS Medicare.gov Compare Post Acute Care list provided to:: Patient Choice offered to / list presented to : Patient  Expected Discharge Plan and Services Expected Discharge Plan: [place] In-house Referral: NA Discharge Planning Services: CM Consult Post Acute Care Choice: [place] arrangements for the past 2 months: Apartment Expected Discharge Date: 08/04/20               DME Arranged: N/A DME Agency: NA       HH Arranged: RN, Disease Management, PT [place] Agency: [place] Date [facility] Agency Contacted: 08/04/20 Time HH Agency Contacted:  [phone] Representative spoke with at [place]: Tommi Rumps  Prior Living Arrangements/Services Living arrangements for the past 2 months: Apartment Lives with:: Self Patient language and need for interpreter reviewed:: Yes Do you feel safe going back to the place where you live?: Yes      Need for Family Participation in Patient Care: Yes (Comment) Care giver support system in place?: Yes (comment) Current home services: Homehealth aide (Patient has personal care services.) Criminal Activity/Legal Involvement Pertinent to Current Situation/Hospitalization: No - Comment as needed  Activities of Daily Living Home Assistive Devices/Equipment: Eyeglasses, Other (Comment) (SCOOTER) ADL Screening (condition at time of admission) Patient's cognitive ability adequate to safely complete daily activities?: Yes Is the patient deaf or have difficulty hearing?: Yes Does the patient have difficulty seeing, even when wearing glasses/contacts?: No Does the patient have difficulty concentrating, remembering, or making decisions?: No Patient able to express need for assistance with ADLs?: Yes Does the patient have difficulty dressing or bathing?: No Independently performs ADLs?: Yes (appropriate for developmental age) Does the patient have difficulty walking or climbing stairs?: Yes Weakness of Legs: Both Weakness of Arms/Hands: None  Permission Sought/Granted Permission sought to share information with : Case Manager, Customer service manager Permission granted to share information with : Yes, Verbal Permission Granted     Permission granted to share info w AGENCY: [facility]        Emotional Assessment Appearance:: Appears stated age Attitude/Demeanor/Rapport: Engaged Affect (typically observed): Accepting Orientation: : Oriented to Self, Oriented to Place, Oriented to  Time, Oriented to Situation Alcohol / Substance Use: Not Applicable Psych Involvement: No (  comment)  Admission diagnosis:  ST  elevation myocardial infarction (STEMI), unspecified artery (HCC) [I21.3] NSTEMI (non-ST elevated myocardial infarction) [facility]) [I21.4] Patient Active Problem List   Diagnosis Date Noted  . Bradycardia 08/03/2020  . Hypotension 08/02/2020  . CAD (coronary artery disease) 08/02/2020  . Shock - Cardiogenic vs Septic   . NSTEMI (non-ST elevated myocardial infarction) ([place]) 07/27/2020  . Atrial Fibrillation/Flutter 07/27/2020  . Band keratopathy of both eyes 11/10/2019  . Pseudophakia of both eyes 11/10/2019  . Routine general medical examination at a health care facility 07/21/2019  . Complication of vascular dialysis catheter 07/17/2019  . Cholecystitis 04/20/2019  . Allergy, unspecified, sequela 12/29/2018  . Pain, unspecified 12/29/2018  . Leg lesion 06/16/2018  . Protein calorie malnutrition ([place]) 05/09/2018  . Hyperkalemia 04/11/2018  . Hemorrhoid 02/28/2018  . Numbness 10/20/2017  . Fluid overload, unspecified 06/04/2016  . Hypocalcemia 02/20/2016  . Anterior urethral stricture 12/09/2015  . Iron deficiency anemia, unspecified 11/30/2015  . Dependence on renal dialysis ([place]) 11/27/2015  . ESRD on dialysis ([place]) 11/15/2015  . Coagulation defect, unspecified ([place]) 11/02/2015  . Acute on chronic systolic (congestive) heart failure ([place]) 10/29/2015  . HFrEF (heart failure with reduced ejection fraction) ([place]) 10/07/2015  . Prolonged Q-T interval on ECG 10/07/2015  . Chronic deep vein thrombosis (DVT) of left lower extremity ([place]) 10/07/2015  . Hypomagnesemia 06/26/2015  . Aortic insufficiency 04/12/2015  . Endocarditis 12/04/2014  . Lumbar discitis 11/22/2014  . Back pain at L4-L5 level   . Anemia due to chronic kidney disease   . Cervical radiculopathy   . Diabetic peripheral neuropathy ([place]) 10/16/2014  . Hematuria 08/06/2014  . Renal transplant, status post 04/05/2014  . OSA (obstructive sleep apnea) 04/05/2014  . Severe obesity (BMI >= 40) ([place]) 04/05/2014  . Benign  fibroma of prostate 02/05/2014  . Bladder neck obstruction 02/05/2014  . Visceral obesity 04/08/2012  . Male infertility 03/28/2012  . Immunosuppressive management encounter following kidney transplant 03/24/2012  . Diabetic macular edema ([place]) 01/15/2012  . Diabetes mellitus, type 2 ([place]) 01/15/2012  . IMMUNOCOMPROMISED 01/30/2010  . Diarrhea 01/30/2010  . HYPERPARATHYROIDISM, SECONDARY 01/27/2010  . GOUT 01/27/2010  . Anxiety 01/27/2010  . INSOMNIA 01/27/2010  . HLD (hyperlipidemia) 05/25/2008  . Essential hypertension 05/25/2008   PCP:  Hoyt Koch, MD Pharmacy:    Readmission Risk Interventions Readmission Risk Prevention Plan 08/04/2020  PCP or Specialist Appt within 3-5 Days Complete  HRI or Home Care Consult Complete  Social Work Consult for [place] Planning/Counseling Complete  Palliative Care Screening Complete  Medication Review Press photographer) Complete  Some recent data might be hidden

## 2020-08-05 ENCOUNTER — Telehealth (HOSPITAL_COMMUNITY): Payer: Self-pay | Admitting: Nephrology

## 2020-08-05 ENCOUNTER — Telehealth: Payer: Self-pay | Admitting: Endocrinology

## 2020-08-05 ENCOUNTER — Telehealth: Payer: Self-pay | Admitting: Cardiology

## 2020-08-05 ENCOUNTER — Telehealth: Payer: Self-pay | Admitting: Internal Medicine

## 2020-08-05 LAB — CULTURE, BLOOD (ROUTINE X 2)
Culture: NO GROWTH
Culture: NO GROWTH
Special Requests: ADEQUATE

## 2020-08-05 LAB — HEPATITIS B E ANTIBODY: Hep B E Ab: NEGATIVE

## 2020-08-05 NOTE — Telephone Encounter (Signed)
TOC Patient-  Please call Patient-  Pt have an appt with Cecilie Kicks on 08-16-20

## 2020-08-05 NOTE — Telephone Encounter (Signed)
Need to move up his appointment because of recent low sugars in the hospital, will also need to know if he has any problem with his sugars since discharge

## 2020-08-05 NOTE — Telephone Encounter (Signed)
TOC Patient- Please call Patient- Pt have an appt on 08-16-20 with Cecilie Kicks

## 2020-08-05 NOTE — Telephone Encounter (Signed)
    Erline Levine from [employer], will Dr Sharlet Salina covering provider be willing to write or sign orders for palliative care? Please call [phone], option 2

## 2020-08-05 NOTE — Telephone Encounter (Signed)
**Note De-Identified Phoebe Marter Obfuscation** Patient contacted regarding discharge from [facility] on 08/04/2020.  Patient understands to follow up with provider Cecilie Kicks, NP on 08/16/2020 at 10:45 at [address] in [place], [place] [address]. Also advised through [facility] mess per the pts request.  Patient understands discharge instructions? Yes Patient understands medications and regiment? Yes Patient understands to bring all medications to this visit? Yes  Ask patient:  Are you enrolled in My Chart: Yes The pt states that other than being tired and weak he feels fine. He denies CP, SOB, nausea, diaphoresis, headaches, dizziness or light headedness.  He is aware to call [place] at [phone] if he has any questions or concerns. He thanked me for calling him.

## 2020-08-05 NOTE — Telephone Encounter (Signed)
Yes, that would be fine.

## 2020-08-05 NOTE — Telephone Encounter (Signed)
Transition of care contact from inpatient facility  Date of discharge: 08/04/20 Date of contact: 08/05/20 Method: Phone Spoke to: Patient  Patient contacted to discuss transition of care from recent inpatient hospitalization. Patient was admitted to [facility] from 07/27/20 - 08/04/20 with discharge diagnosis of NSTEMI/severe CAD (s/p PCI to prox LAD) and cholecystitis.  Medication changes were reviewed. Has picked up all new meds - will bring to HD tomorrow.  Patient will follow up with his/her outpatient HD unit on: tomorrow (Tuesday). He needs to call the trasnportaion company to assure his ride is arranged - he will do this now.  Reminded as well to call and schedule cardiology follow-up appt.  Veneta Penton, PA-C [employer] Pager [phone]

## 2020-08-06 ENCOUNTER — Other Ambulatory Visit: Payer: Self-pay | Admitting: Endocrinology

## 2020-08-07 ENCOUNTER — Ambulatory Visit: Payer: Medicare HMO

## 2020-08-07 ENCOUNTER — Telehealth: Payer: Self-pay | Admitting: Cardiology

## 2020-08-07 DIAGNOSIS — T82868A Thrombosis of vascular prosthetic devices, implants and grafts, initial encounter: Secondary | ICD-10-CM | POA: Diagnosis not present

## 2020-08-07 DIAGNOSIS — N2581 Secondary hyperparathyroidism of renal origin: Secondary | ICD-10-CM | POA: Diagnosis not present

## 2020-08-07 DIAGNOSIS — I871 Compression of vein: Secondary | ICD-10-CM | POA: Diagnosis not present

## 2020-08-07 DIAGNOSIS — Z992 Dependence on renal dialysis: Secondary | ICD-10-CM | POA: Diagnosis not present

## 2020-08-07 DIAGNOSIS — N186 End stage renal disease: Secondary | ICD-10-CM | POA: Diagnosis not present

## 2020-08-07 NOTE — Telephone Encounter (Signed)
   I went in pt's chart to see if a TOC call had been made to the patient

## 2020-08-07 NOTE — Telephone Encounter (Signed)
LVM--to call the office back. 

## 2020-08-07 NOTE — Chronic Care Management (AMB) (Unsigned)
Chronic Care Management Pharmacy  Name: [name] [name]  MRN: [id number] DOB: 03/15/1966   Chief Complaint/ HPI  [name] [name],  54 y.o. , male presents for their Initial CCM visit with the clinical pharmacist In office.  PCP : Hoyt Koch, MD Patient Care Team: Hoyt Koch, MD as PCP - General (Internal Medicine) Burnell Blanks, MD as PCP - Cardiology (Cardiology) Jamal Maes, MD as Consulting Physician (Nephrology) Burnell Blanks, MD as Consulting Physician (Cardiology) Kathrynn Ducking, MD as Consulting Physician (Neurology) Charlton Haws, [facility] as Pharmacist (Pharmacist)  Their chronic conditions include: Hypertension, Hyperlipidemia, Diabetes, Atrial Fibrillation, Heart Failure, Coronary Artery Disease, Chronic Kidney Disease, Anxiety and Gout, Hx Kidney transplant, OSA, Hx DVT  Office Visits: 03/22/20 Dr Sharlet Salina OV: unable to be assessed for renal transplant due to midodrine and repeated falls. Needs new mobility device and letter to give to transplant team.  Consult Visit: 07/27/20 - 08/04/20 hospital admission:  -NSTEMI PCI to LAD w/ stent. Aspirin, Plavix, Warfarin x 1 month, then drop aspirin. Unable to tolerate beta blocker, ACE/ARB. Discharged on atorvastatin 80 mg. -hypotension/possible sepsis: completed Zosyn, transitioned to Augmentin -Hypoglycemic episodes, basal insulin was halved at discharged, and sent home on sliding scale  -Med changes: stop buspar, phoslo, omeprazole, rosuvastatin 20 mg. Start atorvastatin 80 mg, pantoprazole 40 mg  07/24/20 NP Truitt Merle (cardiology): atypical chest pain/pressure, ordered stress test.   06/14/20 Dr Lissa Merlin ([place] transplant): appears to not be good candidate.  05/17/20 Dr Dwyane Dee (endocrine): Reduce PM Toujeo to 76 units. May be candidate for [facility]. In meantime check fasting BG 5 days per week, and 2-hr PP BG by rotation  Allergies  Allergen Reactions  . Pork-Derived  Products Anaphylaxis and Other (See Comments)    Fever also   . Ace Inhibitors Cough  . Hydrocodone Other (See Comments)    Hallucinations  . Pork Allergy     Other reaction(s): Unknown    Medications: Outpatient Encounter Medications as of 08/07/2020  Medication Sig Note  . Accu-Chek FastClix Lancets MISC USE TO CHECK BLOOD SUGARS 4 TIMES A DAY. (Patient taking differently: 1 each by Other route See admin instructions. USE TO CHECK BLOOD SUGARS 4 TIMES A DAY.)   . acetaminophen (TYLENOL) 325 MG tablet Take 2 tablets (650 mg total) by mouth every 4 (four) hours as needed for headache or mild pain.   Marland Kitchen allopurinol (ZYLOPRIM) 100 MG tablet Take 1 tablet (100 mg total) by mouth 3 (three) times a week. After dialysis 07/27/2020: Ran out Last week.   Marland Kitchen amoxicillin-clavulanate (AUGMENTIN) 500-125 MG tablet Take 1 tablet (500 mg total) by mouth every 12 (twelve) hours for 4 days.   Marland Kitchen aspirin 81 MG chewable tablet Chew 1 tablet (81 mg total) by mouth daily.   Marland Kitchen atorvastatin (LIPITOR) 80 MG tablet Take 1 tablet (80 mg total) by mouth daily.   Lorin Picket 1 GM 210 MG(Fe) tablet Take 210 mg by mouth 3 (three) times daily with meals.    . clonazePAM (KLONOPIN) 0.5 MG tablet Take 1 tablet (0.5 mg total) by mouth 2 (two) times daily as needed for anxiety.   . clopidogrel (PLAVIX) 75 MG tablet Take 1 tablet (75 mg total) by mouth daily with breakfast.   . docusate sodium (STOOL SOFTENER) 100 MG capsule Take 1 capsule (100 mg total) by mouth 2 (two) times daily.   . DROPLET PEN NEEDLES 32G X 4 MM MISC USE  TO INJECT FIVE TIMES DAILY AS DIRECTED (  Patient taking differently: 1 each by Other route See admin instructions. Use to inject 5 times daily as directed.)   . glucose blood (ACCU-CHEK GUIDE) test strip 1 each by Other route as needed for other. Use as instructed to check blood sugar 3 times daily. DX:E11.65   . hydrocortisone 1 % ointment Apply 1 application topically 2 (two) times daily.   .  hydrocortisone-pramoxine (PROCTOFOAM-HC) rectal foam Place 1 applicator rectally 2 (two) times daily.   . insulin glargine, 2 Unit Dial, (TOUJEO MAX SOLOSTAR) 300 UNIT/ML Solostar Pen Inject 40 units in the morning and 40 units in the evening.   . Insulin Pen Needle 32G X 8 MM MISC Use with insulin x5 times a day (Patient taking differently: 1 each by Other route See admin instructions. Use with insulin x5 times a day)   . insulin regular human CONCENTRATED (HUMULIN R U-500 KWIKPEN) 500 UNIT/ML kwikpen INJECT 50 UNITS UNDER THE SKIN AT BREAKFAST, 70 UNITS AT LUNCH, AND 60 UNITS AT DINNER.   . midodrine (PROAMATINE) 10 MG tablet Take 1 tablet (10 mg total) by mouth 3 (three) times daily with meals.   . pantoprazole (PROTONIX) 40 MG tablet Take 1 tablet (40 mg total) by mouth daily.   Marland Kitchen RENVELA 800 MG tablet Take 2,400 mg by mouth 3 (three) times daily with meals.    . sodium zirconium cyclosilicate (LOKELMA) 10 g PACK packet Take 10 g by mouth daily.  07/27/2020: Patient wasn't able to verify this medication.   . VELTASSA 8.4 g packet Take 8.4 g by mouth daily.    Marland Kitchen warfarin (COUMADIN) 5 MG tablet TAKE 1 TABLET EVERY DAY OR AS DIRECTED BY COUMADIN CLINIC. (Patient taking differently: Take 5 mg by mouth See admin instructions. TAKE 1 TABLET EVERY DAY OR AS DIRECTED BY COUMADIN CLINIC.)    No facility-administered encounter medications on file as of 08/07/2020.    Wt Readings from Last 3 Encounters:  08/04/20 296 lb 9.6 oz (134.5 kg)  07/24/20 296 lb 6.4 oz (134.4 kg)  03/22/20 285 lb (129.3 kg)    Current Diagnosis/Assessment:    Goals Addressed   None     Heart Failure / Hypotension   Type: Systolic  Last ejection fraction: 40-45% (07/29/2020)  BP goal is:  <130/80 BP Readings from Last 3 Encounters:  08/04/20 (!) 105/94  07/24/20 (!) 100/56  05/17/20 (!) 142/64   Kidney Function Lab Results  Component Value Date/Time   CREATININE 9.14 (H) 08/03/2020 07:46 AM   CREATININE  9.16 (H) 08/03/2020 06:46 AM   GFR 3.51 (LL) 10/20/2019 09:53 AM   GFRNONAA 6 (L) 08/03/2020 07:46 AM   GFRAA 4 (L) 08/01/2019 07:46 AM   K 4.2 08/03/2020 07:46 AM   K 4.2 08/03/2020 06:46 AM   Patient checks BP at home {CHL HP BP Monitoring [phone]} Patient home BP readings are ranging: ***  Patient has failed these meds in past: *** Patient is currently {CHL Controlled/Uncontrolled:[phone]} on the following medications:  Marland Kitchen Midodrine 10 mg TID w/ meals  We discussed {CHL HP Upstream Pharmacy discussion:[phone]}  Plan  Continue {CHL HP Upstream Pharmacy Plans:[phone]}   AFIB / Hx DVT   LLE DVT 2016 Warfarin per PCP coumadin clinic.  Patient is currently rate controlled. Office heart rates are  Pulse Readings from Last 3 Encounters:  08/04/20 86  07/24/20 97  05/17/20 (!) 105    CHA2DS2-VASc Score = 4  The patient's score is based upon: CHF History: 1 HTN History: 1  Diabetes History: 1 Stroke History: 0 Vascular Disease History: 1 Age Score: 0 Gender Score: 0  Lab Results  Component Value Date/Time   INR 1.6 (H) 08/04/2020 02:57 AM   INR 2.0 (H) 08/03/2020 02:23 AM   Patient has failed these meds in past: *** Patient is currently {CHL Controlled/[phone]} on the following medications:  . Warfarin 5 mg as directed  We discussed:  {CHL HP Upstream Pharmacy discussion:[phone]}  Plan  Continue {CHL HP Upstream Pharmacy [phone]}   Hyperlipidemia / CAD   LDL goal < 70 Stent to LAD 07/27/2020  Lipid Panel     Component Value Date/Time   CHOL 93 05/13/2020 1101   TRIG 113.0 05/13/2020 1101   HDL 27.60 (L) 05/13/2020 1101   LDLCALC 43 05/13/2020 1101    Hepatic Function Latest Ref Rng & Units 08/03/2020 08/01/2020 08/01/2020  Total Protein 6.5 - 8.1 g/dL - - -  Albumin 3.5 - 5.0 g/dL 2.7(L) 2.6(L) 2.9(L)  AST 15 - 41 U/L - - -  ALT 0 - 44 U/L - - -  Alk Phosphatase 38 - 126 U/L - - -  Total Bilirubin  0.3 - 1.2 mg/dL - - -  Bilirubin, Direct 0.0 - 0.2 mg/dL - - -     The ASCVD Risk score Mikey Bussing DC Jr., et al., 2013) failed to calculate for the following reasons:   The patient has a prior MI or stroke diagnosis   Patient has failed these meds in past: *** Patient is currently {CHL Controlled/[phone]} on the following medications:  . Atorvastatin 80 mg daily . Clopidogrel 75 mg daily . Aspirin 81 mg daily  We discussed:  {CHL HP Upstream Pharmacy discussion:[phone]}  Plan  Continue {CHL HP Upstream Pharmacy [phone]}  Diabetes   A1c goal <7%  Recent Relevant Labs: Lab Results  Component Value Date/Time   HGBA1C 6.8 (H) 07/28/2020 01:21 AM   HGBA1C 7.5 (H) 05/13/2020 11:01 AM   GFR 3.51 (LL) 10/20/2019 09:53 AM   GFR 7.43 (LL) 07/21/2019 11:21 AM   MICROALBUR 232.9 (H) 06/19/2015 11:05 AM    Last diabetic Eye exam:  Lab Results  Component Value Date/Time   HMDIABEYEEXA No Retinopathy 11/14/2019 12:00 AM   HMDIABEYEEXA No Retinopathy 11/14/2019 12:00 AM    Last diabetic Foot exam: No results found for: HMDIABFOOTEX   Checking BG: {CHL HP Blood Glucose Monitoring Frequency:[phone]}  Recent FBG Readings: *** Recent pre-meal BG readings: *** Recent 2hr PP BG readings:  *** Recent HS BG readings: ***  Patient has failed these meds in past: *** Patient is currently {CHL Controlled/[phone]} on the following medications: . Toujeo Max 40 units BID . Humulin R U-500 50 units AM, 70 units lunch, 60 units dinner  We discussed: {CHL HP Upstream Pharmacy discussion:[phone]}  Plan  Continue {CHL HP Upstream Pharmacy [phone]}  ESRD on Dialysis   HD [facility] (Dr Carolin Sicks)  BMP Latest Ref Rng & Units 08/03/2020 08/03/2020 08/01/2020  Glucose 70 - 99 mg/dL 189(H) 189(H) 129(H)  BUN 6 - 20 mg/dL 49(H) 49(H) 60(H)  Creatinine 0.61 - 1.24 mg/dL 9.14(H) 9.16(H) 9.04(H)  Sodium 135 - 145  mmol/L 134(L) 135 136  Potassium 3.5 - 5.1 mmol/L 4.2 4.2 4.4  Chloride 98 - 111 mmol/L 96(L) 97(L) 98  CO2 22 - 32 mmol/L _0 Calcium 8.9 - 10.3 mg/dL 8.1(L) 8.2(L) 8.2(L)   Lab Results  Component Value Date   PTH 408 (H) 10/16/2015   CALCIUM 8.1 (  L) 08/03/2020   CAION 1.13 (L) 07/31/2020   PHOS 4.7 (H) 08/03/2020   Patient has failed these meds in past: *** Patient is currently {CHL Controlled/Uncontrolled:[id number]} on the following medications:  . Auryxia 210 mg TID w/ meals . Renvela 800 mg - 3 tab TID w/ meals . Veltassa 8.4 g packet daily . Lokelma (sodium zirconium cyclosilicate) 10 g daily  We discussed:  ***  Plan  Continue {CHL HP Upstream Pharmacy [phone]}  Gout   Patient has failed these meds in past: *** Patient is currently {CHL Controlled/Uncontrolled:8592317420} on the following medications:  . Allopurinol 100 mg TIW after dialysis  We discussed:  ***  Plan  Continue {CHL HP Upstream Pharmacy [phone]}  Anxiety   Patient has failed these meds in past: *** Patient is currently {CHL Controlled/Uncontrolled:8592317420} on the following medications:  . Clonazepam 0.5 mg BID prn  We discussed:  ***  Plan  Continue {CHL HP Upstream Pharmacy [phone]}  GERD   Patient has failed these meds in past: *** Patient is currently {CHL Controlled/Uncontrolled:[id number]} on the following medications:  . Pantoprazole 40 mg daily  We discussed:  ***  Plan  Continue {CHL HP Upstream Pharmacy [phone]}   Health Maintenance   Patient is currently {CHL Controlled/Uncontrolled:8592317420} on the following medications:  . Tylenol 325 mg PRN . Docusate 100 mg BID . Hydrocortisone 1% ointment  . Proctofoam PRN  We discussed:  ***  Plan  Continue {CHL HP Upstream Pharmacy [phone]}  Medication Management   Pt uses [facility] pharmacy for all medications Uses pill box? {Yes or If no,  why not?:20788} Pt endorses ***% compliance  We discussed: {Pharmacy options:24294}  Plan  {US Pharmacy MEQA:83419}    Follow up: *** month phone visit  ***

## 2020-08-08 DIAGNOSIS — K819 Cholecystitis, unspecified: Secondary | ICD-10-CM | POA: Diagnosis not present

## 2020-08-08 DIAGNOSIS — E1122 Type 2 diabetes mellitus with diabetic chronic kidney disease: Secondary | ICD-10-CM | POA: Diagnosis not present

## 2020-08-08 DIAGNOSIS — N2581 Secondary hyperparathyroidism of renal origin: Secondary | ICD-10-CM | POA: Diagnosis not present

## 2020-08-08 DIAGNOSIS — I214 Non-ST elevation (NSTEMI) myocardial infarction: Secondary | ICD-10-CM | POA: Diagnosis not present

## 2020-08-08 DIAGNOSIS — N186 End stage renal disease: Secondary | ICD-10-CM | POA: Diagnosis not present

## 2020-08-08 DIAGNOSIS — Z992 Dependence on renal dialysis: Secondary | ICD-10-CM | POA: Diagnosis not present

## 2020-08-08 NOTE — Telephone Encounter (Signed)
LDVM for [name] [name] with [place] giving verbal OK for Palliative Care.

## 2020-08-10 DIAGNOSIS — N186 End stage renal disease: Secondary | ICD-10-CM | POA: Diagnosis not present

## 2020-08-10 DIAGNOSIS — N2581 Secondary hyperparathyroidism of renal origin: Secondary | ICD-10-CM | POA: Diagnosis not present

## 2020-08-10 DIAGNOSIS — Z992 Dependence on renal dialysis: Secondary | ICD-10-CM | POA: Diagnosis not present

## 2020-08-11 DIAGNOSIS — E1122 Type 2 diabetes mellitus with diabetic chronic kidney disease: Secondary | ICD-10-CM | POA: Diagnosis not present

## 2020-08-11 DIAGNOSIS — Z992 Dependence on renal dialysis: Secondary | ICD-10-CM | POA: Diagnosis not present

## 2020-08-11 DIAGNOSIS — N186 End stage renal disease: Secondary | ICD-10-CM | POA: Diagnosis not present

## 2020-08-12 DIAGNOSIS — I38 Endocarditis, valve unspecified: Secondary | ICD-10-CM | POA: Diagnosis not present

## 2020-08-12 DIAGNOSIS — E1142 Type 2 diabetes mellitus with diabetic polyneuropathy: Secondary | ICD-10-CM | POA: Diagnosis not present

## 2020-08-12 DIAGNOSIS — M4627 Osteomyelitis of vertebra, lumbosacral region: Secondary | ICD-10-CM | POA: Diagnosis not present

## 2020-08-12 DIAGNOSIS — R2681 Unsteadiness on feet: Secondary | ICD-10-CM | POA: Diagnosis not present

## 2020-08-12 DIAGNOSIS — I358 Other nonrheumatic aortic valve disorders: Secondary | ICD-10-CM | POA: Diagnosis not present

## 2020-08-13 DIAGNOSIS — N2581 Secondary hyperparathyroidism of renal origin: Secondary | ICD-10-CM | POA: Diagnosis not present

## 2020-08-13 DIAGNOSIS — Z992 Dependence on renal dialysis: Secondary | ICD-10-CM | POA: Diagnosis not present

## 2020-08-13 DIAGNOSIS — N186 End stage renal disease: Secondary | ICD-10-CM | POA: Diagnosis not present

## 2020-08-14 ENCOUNTER — Other Ambulatory Visit: Payer: Self-pay | Admitting: Internal Medicine

## 2020-08-14 ENCOUNTER — Other Ambulatory Visit: Payer: Medicare HMO

## 2020-08-14 ENCOUNTER — Other Ambulatory Visit: Payer: Self-pay | Admitting: Endocrinology

## 2020-08-15 DIAGNOSIS — N2581 Secondary hyperparathyroidism of renal origin: Secondary | ICD-10-CM | POA: Diagnosis not present

## 2020-08-15 DIAGNOSIS — Z992 Dependence on renal dialysis: Secondary | ICD-10-CM | POA: Diagnosis not present

## 2020-08-15 DIAGNOSIS — N186 End stage renal disease: Secondary | ICD-10-CM | POA: Diagnosis not present

## 2020-08-16 ENCOUNTER — Other Ambulatory Visit: Payer: Medicare HMO

## 2020-08-16 ENCOUNTER — Ambulatory Visit: Payer: Medicare HMO | Admitting: Podiatry

## 2020-08-16 ENCOUNTER — Ambulatory Visit: Payer: Medicare HMO | Admitting: Cardiology

## 2020-08-17 DIAGNOSIS — Z992 Dependence on renal dialysis: Secondary | ICD-10-CM | POA: Diagnosis not present

## 2020-08-17 DIAGNOSIS — N186 End stage renal disease: Secondary | ICD-10-CM | POA: Diagnosis not present

## 2020-08-17 DIAGNOSIS — N2581 Secondary hyperparathyroidism of renal origin: Secondary | ICD-10-CM | POA: Diagnosis not present

## 2020-08-18 ENCOUNTER — Other Ambulatory Visit: Payer: Self-pay | Admitting: Internal Medicine

## 2020-08-19 ENCOUNTER — Other Ambulatory Visit: Payer: Self-pay

## 2020-08-19 ENCOUNTER — Ambulatory Visit: Payer: Medicare HMO | Admitting: Endocrinology

## 2020-08-19 ENCOUNTER — Other Ambulatory Visit (INDEPENDENT_AMBULATORY_CARE_PROVIDER_SITE_OTHER): Payer: Medicare HMO

## 2020-08-19 DIAGNOSIS — E1165 Type 2 diabetes mellitus with hyperglycemia: Secondary | ICD-10-CM

## 2020-08-19 DIAGNOSIS — Z794 Long term (current) use of insulin: Secondary | ICD-10-CM

## 2020-08-19 LAB — HEMOGLOBIN A1C: Hgb A1c MFr Bld: 7.2 % — ABNORMAL HIGH (ref 4.6–6.5)

## 2020-08-19 LAB — GLUCOSE, RANDOM: Glucose, Bld: 137 mg/dL — ABNORMAL HIGH (ref 70–99)

## 2020-08-20 DIAGNOSIS — N2581 Secondary hyperparathyroidism of renal origin: Secondary | ICD-10-CM | POA: Diagnosis not present

## 2020-08-20 DIAGNOSIS — N186 End stage renal disease: Secondary | ICD-10-CM | POA: Diagnosis not present

## 2020-08-20 DIAGNOSIS — Z992 Dependence on renal dialysis: Secondary | ICD-10-CM | POA: Diagnosis not present

## 2020-08-21 ENCOUNTER — Ambulatory Visit (INDEPENDENT_AMBULATORY_CARE_PROVIDER_SITE_OTHER): Payer: Medicare HMO | Admitting: Endocrinology

## 2020-08-21 ENCOUNTER — Ambulatory Visit: Payer: Medicare HMO | Admitting: Podiatry

## 2020-08-21 ENCOUNTER — Other Ambulatory Visit: Payer: Self-pay | Admitting: *Deleted

## 2020-08-21 ENCOUNTER — Other Ambulatory Visit: Payer: Self-pay

## 2020-08-21 VITALS — BP 128/84 | HR 109 | Ht 70.0 in | Wt 292.0 lb

## 2020-08-21 DIAGNOSIS — E1165 Type 2 diabetes mellitus with hyperglycemia: Secondary | ICD-10-CM

## 2020-08-21 DIAGNOSIS — Z794 Long term (current) use of insulin: Secondary | ICD-10-CM

## 2020-08-21 MED ORDER — ACCU-CHEK FASTCLIX LANCETS MISC: 4 refills | Status: AC

## 2020-08-21 MED ORDER — ACCU-CHEK GUIDE VI STRP: ORAL_STRIP | 12 refills | Status: DC

## 2020-08-21 MED ORDER — TOUJEO MAX SOLOSTAR 300 UNIT/ML ~~LOC~~ SOPN: PEN_INJECTOR | SUBCUTANEOUS | 1 refills | Status: DC

## 2020-08-21 MED ORDER — HUMULIN R U-500 KWIKPEN 500 UNIT/ML ~~LOC~~ SOPN: PEN_INJECTOR | SUBCUTANEOUS | 1 refills | Status: DC

## 2020-08-21 MED ORDER — DROPLET PEN NEEDLES 32G X 4 MM MISC: 1 refills | Status: DC

## 2020-08-21 NOTE — Progress Notes (Signed)
Patient ID: [name] [name], male   DOB: Feb 25, 1966, 54 y.o.   MRN: [id number]           Reason for Appointment: Follow-up for Type 2 Diabetes  History of Present Illness:          Date of diagnosis of type 2 diabetes mellitus : 1990       Background history:  He has been taking insulin since about 1999, previously was on unknown oral agents He had good control with insulin initially but A1c was much higher in 2008 and subsequently has had required larger doses of insulin He has been on Lantus for the last few years along with Novolog,followed by an endocrinologist since 2008 His A1c was 6.3 in 04/2014   Prior to his consultation he was on a regimen of Novolog , 50 units 5 times a day with each meal and snack along with 90 Units 3 times a day of Lantus   Recent history:   INSULIN regimen is :TOUJEO 80 units a.m.-- 80 units evening daily  Humulin R U-500 insulin with KwikPen 50 units before breakfast, 60 before lunch and 70 before dinner  Previous Sliding scale use for high sugars: Sugar 150-199 take 2 units, 200-250= 4 units and over 250 take 6 units    Current blood sugar patterns and problems identified:  His A1c is 7.2 and has been inconsistent    He is checking his blood sugars only in the mornings usually and recently has checked readings at other times only once  He says that he does not have his meter with him except in the morning when he does not take it with him at other locations  Also he thinks that his sporadic high readings are related to depression and possibly eating during the night also  He has only 1 low sugar of 66 midmorning possibly from taking his insulin and not eating 1 time  Overall he does not think he has changed his diet  His weight appears to be about the same for higher recently  Although his insulin doses were reduced when he was hospitalized he is taking the same doses again as before   Dinner at 6 pm, breakfast usually 8 AM  Compliance  with the medical regimen: Inconsistent   Glucose monitoring:  done 0-2 times a day         Glucometer: Accu-Chek       Blood Glucose readings and averages    PRE-MEAL Fasting Lunch Dinner Bedtime Overall  Glucose range: 61-224   201-370  177   Mean/median:  194    197   Previous readings  PRE-MEAL Fasting Lunch Dinner Bedtime Overall  Glucose range: 72-292  87   98, 184   Mean/median:  152     148      Self-care: The diet that the patient has been following is: tries to limit high-fat meals, usually eating  2-3 meals a day.                    Dietician visit, most recent:2013               Exercise:  unable to do any, In wheelchair  Weight history:  Wt Readings from Last 3 Encounters:  08/21/20 292 lb (132.5 kg)  08/04/20 296 lb 9.6 oz (134.5 kg)  07/24/20 296 lb 6.4 oz (134.4 kg)    Glycemic control:   Lab Results  Component Value Date   HGBA1C 7.2 (  H) 08/19/2020   HGBA1C 6.8 (H) 07/28/2020   HGBA1C 7.5 (H) 05/13/2020   Lab Results  Component Value Date   MICROALBUR 232.9 (H) 06/19/2015   LDLCALC 43 05/13/2020   CREATININE 9.14 (H) 08/03/2020    Lab Results  Component Value Date   FRUCTOSAMINE 335 (H) 08/19/2020   FRUCTOSAMINE 387 (H) 01/26/2020   FRUCTOSAMINE 352 (H) 09/12/2018      Allergies as of 08/21/2020      Reactions   Pork-derived Products Anaphylaxis, Other (See Comments)   Fever also    Ace Inhibitors Cough   Hydrocodone Other (See Comments)   Hallucinations   Pork Allergy    Other reaction(s): Unknown      Medication List       Accurate as of August 21, 2020 11:59 PM. If you have any questions, ask your nurse or doctor.        Accu-Chek FastClix Lancets Misc USE TO CHECK BLOOD SUGARS 4 TIMES A DAY. What changed:   how much to take  how to take this  when to take this   Accu-Chek Guide test strip Generic drug: glucose blood USE AS INSTRUCTED TO CHECK BLOOD SUGAR 3 TIMES DAILY AS NEEDED   acetaminophen 325 MG  tablet Commonly known as: TYLENOL Take 2 tablets (650 mg total) by mouth every 4 (four) hours as needed for headache or mild pain.   allopurinol 100 MG tablet Commonly known as: ZYLOPRIM Take 1 tablet (100 mg total) by mouth 3 (three) times a week. After dialysis   aspirin 81 MG chewable tablet Chew 1 tablet (81 mg total) by mouth daily.   atorvastatin 80 MG tablet Commonly known as: LIPITOR Take 1 tablet (80 mg total) by mouth daily.   Auryxia 1 GM 210 MG(Fe) tablet Generic drug: ferric citrate Take 210 mg by mouth 3 (three) times daily with meals.   clonazePAM 0.5 MG tablet Commonly known as: KLONOPIN Take 1 tablet (0.5 mg total) by mouth 2 (two) times daily as needed for anxiety.   clopidogrel 75 MG tablet Commonly known as: PLAVIX Take 1 tablet (75 mg total) by mouth daily with breakfast.   docusate sodium 100 MG capsule Commonly known as: Stool Softener Take 1 capsule (100 mg total) by mouth 2 (two) times daily.   HumuLIN R U-500 KwikPen 500 UNIT/ML kwikpen Generic drug: insulin regular human CONCENTRATED INJECT 50 UNITS UNDER THE SKIN AT BREAKFAST, 70 UNITS AT LUNCH, AND 60 UNITS AT DINNER.   hydrocortisone 1 % ointment Apply 1 application topically 2 (two) times daily.   hydrocortisone-pramoxine rectal foam Commonly known as: PROCTOFOAM-HC Place 1 applicator rectally 2 (two) times daily.   Insulin Pen Needle 32G X 8 MM Misc Use with insulin x5 times a day What changed:   how much to take  how to take this  when to take this   Droplet Pen Needles 32G X 4 MM Misc Generic drug: Insulin Pen Needle USE  TO INJECT FIVE TIMES DAILY AS DIRECTED What changed: See the new instructions. Changed by: Tora Duck, CMA   Lokelma 10 g Pack packet Generic drug: sodium zirconium cyclosilicate Take 10 g by mouth daily.   midodrine 10 MG tablet Commonly known as: PROAMATINE Take 1 tablet (10 mg total) by mouth 3 (three) times daily with meals.   omeprazole 20  MG capsule Commonly known as: PRILOSEC TAKE 1 CAPSULE EVERY DAY, ANNUAL APPOINTMENT DUE IN OCTOBER MUST SEE PROVIDER FOR FUTURE REFILLS.   pantoprazole 40 MG  tablet Commonly known as: PROTONIX Take 1 tablet (40 mg total) by mouth daily.   Renvela 800 MG tablet Generic drug: sevelamer carbonate Take 2,400 mg by mouth 3 (three) times daily with meals.   rosuvastatin 20 MG tablet Commonly known as: CRESTOR TAKE 1 TABLET AT BEDTIME, ANNUAL APPOINTMENT DUE IN OCTOBER MUST SEE PROVIDER FOR FUTURE REFILLS.   Toujeo Max SoloStar 300 UNIT/ML Solostar Pen Generic drug: insulin glargine (2 Unit Dial) Inject 40 units in the morning and 40 units in the evening.   Veltassa 8.4 g packet Generic drug: patiromer Take 8.4 g by mouth daily.   warfarin 5 MG tablet Commonly known as: COUMADIN Take as directed by the anticoagulation clinic. If you are unsure how to take this medication, talk to your nurse or doctor. Original instructions: TAKE 1 TABLET EVERY DAY OR AS DIRECTED BY COUMADIN CLINIC. What changed: See the new instructions.       Allergies:  Allergies  Allergen Reactions  . Pork-Derived Products Anaphylaxis and Other (See Comments)    Fever also   . Ace Inhibitors Cough  . Hydrocodone Other (See Comments)    Hallucinations  . Pork Allergy     Other reaction(s): Unknown    Past Medical History:  Diagnosis Date  . Allergic rhinitis   . Allergy   . Anemia   . Aortic insufficiency    SBE in 2016 >> pt declined AVR // a. Echo 7/16:  Mod LVH, EF 50-55%, mild to mod AI, severe LAE, PASP 57 mmHg (LV ID end diastolic 76.5 mm);  b. Echo 2/17:  Mod LVH, EF 50-55%, mod AI, MAC, mild MR, mod LAE, mild to mod TR, PASP 63 mmHg // Echo 2/21: normal EF, mild MR, mild AI  . Atrial fibrillation and flutter ([place])    chronic anticoag with Warfarin // bradycardia noted during admit 10/21  . Bilateral carpal tunnel syndrome   . Bradycardia    brady/low BP event during admit in 10/21 >> poor  candidate for pacer/EPS due to high risk of infection with ESRD; pacer not recommended by EP  . CAD (coronary artery disease)    s/p NSTEMI 10/21: cath>> pLAD 99 >> tx with DES; Lat RI 100 and OM1 100 - med Rx  . Cataract    removed both eyes   . Cholecystitis    dx during admx 10/21 >> HIDA neg // exam benign // high risk for surgery >> no surgical intervention recommended.   . Chronic systolic CHF (congestive heart failure) ([place])    Ischemic CM // Echo 10/21: EF 40-45, dist sept and apical AK, mild LVH, normal RVSF, mod MAC  . Claustrophobia   . COPD (chronic obstructive pulmonary disease) ([place])   . Coronary artery disease   . Diabetes mellitus    Type II  . Diabetic peripheral neuropathy ([place])   . Diabetic retinopathy ([place])   . Discitis of lumbar region    resolved  . DVT (deep venous thrombosis) ([place]) 10/07/2015   LLE  . Endocarditis 11/28/14  . ESRD (end stage renal disease) on dialysis [facility])    s/p renal transplant in 2009, failed in 2016  . Gait disorder   . Gastroparesis   . GERD (gastroesophageal reflux disease)   . Gout   . Hearing difficulty   . Hemodialysis patient ([place])    tuesday, thursday, saturday   . Hyperlipidemia   . Hypertension   . Incomplete bladder emptying   . Leg pain 02/05/11   with  walking  . Morbid obesity ([place])   . Obstructive sleep apnea    not using CPAP- lost 100lbs  . Osteomyelitis ([place])   . Posttraumatic stress disorder   . Rotator cuff disorder   . Secondary hyperparathyroidism ([place])   . Sepsis ([place])    Postive blood cultures -   . Sleep apnea    no cpap  . Urethral stricture   . Vitamin D deficiency     Past Surgical History:  Procedure Laterality Date  . ARTERIOVENOUS GRAFT PLACEMENT Bilateral "several"  . AV FISTULA PLACEMENT Bilateral 2015  . AV FISTULA PLACEMENT Right 06/13/2019   Procedure: INSERTION OF ARTERIOVENOUS (AV) GORE-TEX GRAFT RIGHT  ARM;  Surgeon: Elam Dutch, MD;  Location: [place];  Service: Vascular;   Laterality: Right;  . BASCILIC VEIN TRANSPOSITION Right 09/27/2015  . [place] Right 09/27/2015   Procedure: BASILIC VEIN TRANSPOSITION right;  Surgeon: Mal Misty, MD;  Location: [place];  Service: Vascular;  Laterality: Right;  . CATARACT EXTRACTION W/ INTRAOCULAR LENS  IMPLANT, BILATERAL Bilateral   . CORONARY STENT INTERVENTION N/A 07/27/2020   Procedure: CORONARY STENT INTERVENTION;  Surgeon: Burnell Blanks, MD;  Location: [place] CV [facility];  Service: Cardiovascular;  Laterality: N/A;  . CYSTOSCOPY W/ INTERNAL URETHROTOMY  09/2014  . IR THROMBECTOMY AV FISTULA W/THROMBOLYSIS/PTA INC/SHUNT/IMG RIGHT Right 08/01/2019  . IR US GUIDE VASC ACCESS RIGHT  08/01/2019  . KIDNEY TRANSPLANT  2009  . LEFT HEART CATH AND CORONARY ANGIOGRAPHY N/A 07/27/2020   Procedure: LEFT HEART CATH AND CORONARY ANGIOGRAPHY;  Surgeon: Burnell Blanks, MD;  Location: [place] [facility];  Service: Cardiovascular;  Laterality: N/A;  . SIGMOIDOSCOPY    . UPPER EXTREMITY VENOGRAPHY Bilateral 06/02/2019   Procedure: UPPER EXTREMITY VENOGRAPHY;  Surgeon: Elam Dutch, MD;  Location: [place] [facility];  Service: Cardiovascular;  Laterality: Bilateral;  . UPPER GASTROINTESTINAL ENDOSCOPY      Family History  Problem Relation Age of Onset  . Hypertension Mother   . Diabetes Mother   . Diabetes Father   . Diabetes Brother   . Colon cancer Neg Hx   . Colon polyps Neg Hx   . Esophageal cancer Neg Hx   . Rectal cancer Neg Hx   . Stomach cancer Neg Hx     Social History:  reports that he has never smoked. He has never used smokeless tobacco. He reports that he does not drink alcohol and does not use drugs.    Review of Systems    ROS   Lipid history: He is on 20 mg Crestor with the following labs    Lab Results  Component Value Date   CHOL 93 05/13/2020   HDL 27.60 (L) 05/13/2020   LDLCALC 43 05/13/2020   TRIG 113.0 05/13/2020   CHOLHDL 3 05/13/2020             Hypertension: His blood pressure is managed by nephrologist and PCP   RENAL: He has had a renal transplant in 2009 and  he is on dialysis  Neurological: Has has had  decreased sensation in his feet, also complaining of tingling and burning Previously followed by neurologist  He has had severe problems with balance and also some weakness in his legs, using a power scooter  Last foot exam showed: Absent monofilament sensation in his feet and toes and extending up to the middle of the lower leg on the left and up to the ankle on the right  LABS:  Lab on 08/19/2020  Component Date Value Ref Range Status  . Glucose, Bld 08/19/2020 137* 70 - 99 mg/dL Final  . Fructosamine 08/19/2020 335* 205 - 285 umol/L Final  . Hgb A1c MFr Bld 08/19/2020 7.2* 4.6 - 6.5 % Final   Glycemic Control Guidelines for People with Diabetes:Non Diabetic:  <6%Goal of Therapy: <7%Additional Action Suggested:  >8%     Physical Examination:  BP 128/84   Pulse (!) 109   Ht _0  (1.778 m)   Wt 292 lb (132.5 kg)   SpO2 93%   BMI 41.90 kg/m      ASSESSMENT:  Diabetes type 2, long-standing with obesity  See history of present illness for detailed discussion of his current management, blood sugar patterns and problems identified.  His A1c is mildly increased at 7.2  He is on large doses of insulin including basal insulin and Humulin R U-500, 3 times a day  As before he has not checked his blood sugars much except in the mornings Morning sugars are recently excellent but overall have been quite variable and mostly high on an average Although he has some high readings in the late afternoon not clear if this is consistent He is not missing any insulin doses apparently     PLAN:  He will try to get the freestyle libre as recommended today Showed him how the freestyle libre sensor works and information provided He will likely need help to set it up In the meantime he says he can try to keep his  Accu-Chek meter with him wherever he is to check his blood sugars up to 4 times a day If his blood sugars are consistently high before dinnertime he may need to increase his lunchtime dose by 10 units   There are no Patient Instructions on file for this visit.      Elayne Snare 08/22/2020, 9:12 AM   Note: This office note was prepared with Dragon voice recognition system technology. Any transcriptional errors that result from this process are unintentional.

## 2020-08-22 ENCOUNTER — Encounter: Payer: Self-pay | Admitting: Endocrinology

## 2020-08-22 DIAGNOSIS — N2581 Secondary hyperparathyroidism of renal origin: Secondary | ICD-10-CM | POA: Diagnosis not present

## 2020-08-22 DIAGNOSIS — Z992 Dependence on renal dialysis: Secondary | ICD-10-CM | POA: Diagnosis not present

## 2020-08-22 DIAGNOSIS — N186 End stage renal disease: Secondary | ICD-10-CM | POA: Diagnosis not present

## 2020-08-22 LAB — FRUCTOSAMINE: Fructosamine: 335 umol/L — ABNORMAL HIGH (ref 205–285)

## 2020-08-23 ENCOUNTER — Other Ambulatory Visit: Payer: Self-pay

## 2020-08-23 ENCOUNTER — Ambulatory Visit (INDEPENDENT_AMBULATORY_CARE_PROVIDER_SITE_OTHER): Payer: Medicare HMO | Admitting: Podiatry

## 2020-08-23 DIAGNOSIS — S90424A Blister (nonthermal), right lesser toe(s), initial encounter: Secondary | ICD-10-CM | POA: Diagnosis not present

## 2020-08-23 DIAGNOSIS — S90425A Blister (nonthermal), left lesser toe(s), initial encounter: Secondary | ICD-10-CM

## 2020-08-23 DIAGNOSIS — E1149 Type 2 diabetes mellitus with other diabetic neurological complication: Secondary | ICD-10-CM | POA: Diagnosis not present

## 2020-08-24 DIAGNOSIS — N2581 Secondary hyperparathyroidism of renal origin: Secondary | ICD-10-CM | POA: Diagnosis not present

## 2020-08-24 DIAGNOSIS — N186 End stage renal disease: Secondary | ICD-10-CM | POA: Diagnosis not present

## 2020-08-24 DIAGNOSIS — Z992 Dependence on renal dialysis: Secondary | ICD-10-CM | POA: Diagnosis not present

## 2020-08-27 ENCOUNTER — Encounter: Payer: Self-pay | Admitting: Podiatry

## 2020-08-27 ENCOUNTER — Telehealth: Payer: Self-pay

## 2020-08-27 ENCOUNTER — Telehealth: Payer: Self-pay | Admitting: Internal Medicine

## 2020-08-27 ENCOUNTER — Telehealth: Payer: Self-pay | Admitting: Cardiovascular Disease

## 2020-08-27 DIAGNOSIS — Z992 Dependence on renal dialysis: Secondary | ICD-10-CM | POA: Diagnosis not present

## 2020-08-27 DIAGNOSIS — N186 End stage renal disease: Secondary | ICD-10-CM | POA: Diagnosis not present

## 2020-08-27 DIAGNOSIS — N2581 Secondary hyperparathyroidism of renal origin: Secondary | ICD-10-CM | POA: Diagnosis not present

## 2020-08-27 NOTE — Progress Notes (Signed)
Subjective:  Patient ID: [name] [name], male    DOB: 14-Nov-1965,  MRN: [id number]  Chief Complaint  Patient presents with  . Blister    bilateral feet callous and blister     54 y.o. male presents with the above complaint.  Patient presents with complaint of bilateral right big toe and left second toe blood blister.  Patient states is mildly painful to touch overall he started noticing that this started popping up over the last few weeks.  Has gotten progressively worse.  He is noted some bleeding with it.  He is known to Dr. Jacqualyn Posey.  This is a new complaint and he just wants to get evaluated before he gets worse.  Patient is a diabetic with last A1c of 7.2.  He has not been doing anything for these lesions.  He denies having any kind of excessive pressure to the tips of the toes that could lead to hemorrhagic blisters.   Review of Systems: Negative except as noted in the HPI. Denies N/V/F/Ch.  Past Medical History:  Diagnosis Date  . Allergic rhinitis   . Allergy   . Anemia   . Aortic insufficiency    SBE in 2016 >> pt declined AVR // a. Echo 7/16:  Mod LVH, EF 50-55%, mild to mod AI, severe LAE, PASP 57 mmHg (LV ID end diastolic 41.9 mm);  b. Echo 2/17:  Mod LVH, EF 50-55%, mod AI, MAC, mild MR, mod LAE, mild to mod TR, PASP 63 mmHg // Echo 2/21: normal EF, mild MR, mild AI  . Atrial fibrillation and flutter ([place])    chronic anticoag with Warfarin // bradycardia noted during admit 10/21  . Bilateral carpal tunnel syndrome   . Bradycardia    brady/low BP event during admit in 10/21 >> poor candidate for pacer/EPS due to high risk of infection with ESRD; pacer not recommended by EP  . CAD (coronary artery disease)    s/p NSTEMI 10/21: cath>> pLAD 99 >> tx with DES; Lat RI 100 and OM1 100 - med Rx  . Cataract    removed both eyes   . Cholecystitis    dx during admx 10/21 >> HIDA neg // exam benign // high risk for surgery >> no surgical intervention recommended.   . Chronic systolic  CHF (congestive heart failure) ([place])    Ischemic CM // Echo 10/21: EF 40-45, dist sept and apical AK, mild LVH, normal RVSF, mod MAC  . Claustrophobia   . COPD (chronic obstructive pulmonary disease) ([place])   . Coronary artery disease   . Diabetes mellitus    Type II  . Diabetic peripheral neuropathy ([place])   . Diabetic retinopathy ([place])   . Discitis of lumbar region    resolved  . DVT (deep venous thrombosis) ([place]) 10/07/2015   LLE  . Endocarditis 11/28/14  . ESRD (end stage renal disease) on dialysis [facility])    s/p renal transplant in 2009, failed in 2016  . Gait disorder   . Gastroparesis   . GERD (gastroesophageal reflux disease)   . Gout   . Hearing difficulty   . Hemodialysis patient ([place])    tuesday, thursday, saturday   . Hyperlipidemia   . Hypertension   . Incomplete bladder emptying   . Leg pain 02/05/11   with walking  . Morbid obesity ([place])   . Obstructive sleep apnea    not using CPAP- lost 100lbs  . Osteomyelitis ([place])   . Posttraumatic stress disorder   . Rotator cuff  disorder   . Secondary hyperparathyroidism ([place])   . Sepsis ([place])    Postive blood cultures -   . Sleep apnea    no cpap  . Urethral stricture   . Vitamin D deficiency     Current Outpatient Medications:  Marland Kitchen  Methoxy PEG-Epoetin Beta (MIRCERA IJ), Mircera, Disp: , Rfl:  .  Accu-Chek FastClix Lancets MISC, USE TO CHECK BLOOD SUGARS 4 TIMES A DAY., Disp: 360 each, Rfl: 4 .  acetaminophen (TYLENOL) 325 MG tablet, Take 2 tablets (650 mg total) by mouth every 4 (four) hours as needed for headache or mild pain. (Patient not taking: Reported on 08/21/2020), Disp: , Rfl:  .  allopurinol (ZYLOPRIM) 100 MG tablet, Take 1 tablet (100 mg total) by mouth 3 (three) times a week. After dialysis, Disp: 30 tablet, Rfl: 6 .  aspirin 81 MG chewable tablet, Chew 1 tablet (81 mg total) by mouth daily., Disp: 30 tablet, Rfl: 0 .  atorvastatin (LIPITOR) 80 MG tablet, Take 1 tablet (80 mg total) by mouth daily., Disp:  90 tablet, Rfl: 3 .  AURYXIA 1 GM 210 MG(Fe) tablet, Take 210 mg by mouth 3 (three) times daily with meals. , Disp: , Rfl:  .  busPIRone (BUSPAR) 5 MG tablet, , Disp: , Rfl:  .  clonazePAM (KLONOPIN) 0.5 MG tablet, Take 1 tablet (0.5 mg total) by mouth 2 (two) times daily as needed for anxiety., Disp: 60 tablet, Rfl: 0 .  clopidogrel (PLAVIX) 75 MG tablet, Take 1 tablet (75 mg total) by mouth daily with breakfast., Disp: 90 tablet, Rfl: 3 .  docusate sodium (STOOL SOFTENER) 100 MG capsule, Take 1 capsule (100 mg total) by mouth 2 (two) times daily., Disp: 30 capsule, Rfl: 1 .  glucose blood (ACCU-CHEK GUIDE) test strip, USE AS INSTRUCTED TO CHECK BLOOD SUGAR 3 TIMES DAILY AS NEEDED, Disp: 300 strip, Rfl: 12 .  hydrocortisone 1 % ointment, Apply 1 application topically 2 (two) times daily., Disp: 30 g, Rfl: 0 .  hydrocortisone-pramoxine (PROCTOFOAM-HC) rectal foam, Place 1 applicator rectally 2 (two) times daily., Disp: 30 g, Rfl: 0 .  insulin glargine, 2 Unit Dial, (TOUJEO MAX SOLOSTAR) 300 UNIT/ML Solostar Pen, Inject 40 units in the morning and 40 units in the evening., Disp: 48 mL, Rfl: 1 .  Insulin Pen Needle (DROPLET PEN NEEDLES) 32G X 4 MM MISC, USE  TO INJECT FIVE TIMES DAILY AS DIRECTED, Disp: 500 each, Rfl: 1 .  Insulin Pen Needle 32G X 8 MM MISC, Use with insulin x5 times a day (Patient taking differently: 1 each by Other route See admin instructions. Use with insulin x5 times a day), Disp: 500 each, Rfl: 1 .  insulin regular human CONCENTRATED (HUMULIN R U-500 KWIKPEN) 500 UNIT/ML kwikpen, INJECT 50 UNITS UNDER THE SKIN AT BREAKFAST, 70 UNITS AT LUNCH, AND 60 UNITS AT DINNER., Disp: 12 mL, Rfl: 1 .  midodrine (PROAMATINE) 10 MG tablet, Take 1 tablet (10 mg total) by mouth 3 (three) times daily with meals., Disp: 90 tablet, Rfl: 11 .  omeprazole (PRILOSEC) 20 MG capsule, TAKE 1 CAPSULE EVERY DAY, ANNUAL APPOINTMENT DUE IN OCTOBER MUST SEE PROVIDER FOR FUTURE REFILLS., Disp: 90 capsule, Rfl:  0 .  pantoprazole (PROTONIX) 40 MG tablet, Take 1 tablet (40 mg total) by mouth daily., Disp: 30 tablet, Rfl: 11 .  RENVELA 800 MG tablet, Take 2,400 mg by mouth 3 (three) times daily with meals. , Disp: , Rfl:  .  rosuvastatin (CRESTOR) 20 MG tablet, TAKE  1 TABLET AT BEDTIME, ANNUAL APPOINTMENT DUE IN OCTOBER MUST SEE PROVIDER FOR FUTURE REFILLS., Disp: 90 tablet, Rfl: 0 .  sodium zirconium cyclosilicate (LOKELMA) 10 g PACK packet, Take 10 g by mouth daily. , Disp: , Rfl:  .  VELTASSA 8.4 g packet, Take 8.4 g by mouth daily. , Disp: , Rfl:  .  warfarin (COUMADIN) 5 MG tablet, TAKE 1 TABLET EVERY DAY OR AS DIRECTED BY COUMADIN CLINIC. (Patient taking differently: Take 5 mg by mouth See admin instructions. TAKE 1 TABLET EVERY DAY OR AS DIRECTED BY COUMADIN CLINIC.), Disp: 100 tablet, Rfl: 0  Social History   Tobacco Use  Smoking Status Never Smoker  Smokeless Tobacco Never Used    Allergies  Allergen Reactions  . Pork-Derived Products Anaphylaxis and Other (See Comments)    Fever also   . Ace Inhibitors Cough  . Hydrocodone Other (See Comments)    Hallucinations  . Pork Allergy     Other reaction(s): Unknown   Objective:  There were no vitals filed for this visit. There is no height or weight on file to calculate BMI. Constitutional Well developed. Well nourished.  Vascular Dorsalis pedis pulses palpable bilaterally. Posterior tibial pulses palpable bilaterally. Capillary refill normal to all digits.  No cyanosis or clubbing noted. Pedal hair growth normal.  Neurologic Normal speech. Oriented to person, place, and time. Epicritic sensation to light touch grossly present bilaterally.  Dermatologic  dried hemorrhagic blisters noted to the right hallux and left second digit.  No clinical signs of infection noted.  The blisters were debrided down to healthy tissue.  This is likely due to pressure from shoes.  No concern for wound at this time  Orthopedic: Normal joint ROM without  pain or crepitus bilaterally. No visible deformities. No bony tenderness.   Radiographs: None Assessment:   1. Type II diabetes mellitus with neurological manifestations (HCC)   2. Blister of toe of right foot, initial encounter   3. Blister of lesser toe of left foot    Plan:  Patient was evaluated and treated and all questions answered.  Right hallux and left second digit hemorrhagic blisters -I explained to the patient the etiology of hemorrhagic blister various treatment options were discussed.  Given that this was dried I debrided down in the clinic which led to underlying with epithelialized skin.  No signs of ulceration noted.  Given the patient is a diabetic with uncontrolled A1c I encouraged him to make shoe gear modification to prevent pressure at the tips of the digit.  I believe this is likely due to pressure in the setting of friction leading to hemorrhagic blisters.  Patient states he will continue to monitor that and make sure he makes a changes so he is not putting excessive pressure to it. -For now I discussed with the patient continue doing Triple Antibiotic and a Band-Aid to keep it covered.  He states understanding  No follow-ups on file.

## 2020-08-27 NOTE — Telephone Encounter (Signed)
Left message for patient to call back  

## 2020-08-27 NOTE — Telephone Encounter (Signed)
New message:    Patient calling stating that he would like to have a apt soon with Dr. Angelena Form. Patient only would like to see the doctor.

## 2020-08-27 NOTE — Telephone Encounter (Signed)
clonazePAM (KLONOPIN) 0.5 MG tablet  warfarin (COUMADIN) 5 MG tablet [place], [place] Phone:  [phone]  Fax:  [phone]     Requesting a refill for medication, is out of medication.  Last seen-06.11.21

## 2020-08-27 NOTE — Telephone Encounter (Signed)
Spoke with patient and have scheduled an In-person Consult for 09/06/20 @ 12:30 PM.  COVID screening was negative. No pets in home.   Consent obtained; updated Outlook/Netsmart/Team List and Epic.

## 2020-08-29 DIAGNOSIS — N186 End stage renal disease: Secondary | ICD-10-CM | POA: Diagnosis not present

## 2020-08-29 DIAGNOSIS — N2581 Secondary hyperparathyroidism of renal origin: Secondary | ICD-10-CM | POA: Diagnosis not present

## 2020-08-29 DIAGNOSIS — Z992 Dependence on renal dialysis: Secondary | ICD-10-CM | POA: Diagnosis not present

## 2020-08-29 MED ORDER — WARFARIN SODIUM 5 MG PO TABS
5.0000 mg | ORAL_TABLET | ORAL | 1 refills | Status: DC
Start: 2020-08-29 — End: 2021-02-10

## 2020-08-29 NOTE — Telephone Encounter (Signed)
I have sent in warfarin. I have not been prescribing his clonazepam and he would need visit to assess if this is appropriate.

## 2020-08-29 NOTE — Progress Notes (Signed)
Date:  08/29/2020   ID:  [name] [name], DOB 16-Aug-1966, MRN [id number] The patient was identified using 2 identifiers.     PCP:  Hoyt Koch, MD  Cardiologist:  Lauree Chandler, MD  Electrophysiologist:  None   Evaluation Performed:    Chief Complaint:    Patient Profile: [name] [name] is a 54 y.o. male with:  Coronary artery disease  S/p non-STEMI 10/21: S/p DES to the LAD  Heart failure with reduced ejection fraction   Echo 10/21: EF 40-45  Diabetes mellitus, Insulin Dependent   ESRD s/p Renal Tx in 2009 ([facility]) ? On hemodialysis   Chronic Diastolic CHF  Atrial tachycardia  Atrial flutter  ? [facility] at [facility] in 10/2018; AFlutter noted post dialysis; no ? in Rx; consider [place] if symptomatic ? CHA2DS2-VASc=4 (diab, HTN, CHF, PVD) ? Coumadin Rx   Hypertension   Hyperlipidemia   Sleep Apnea  Obesity   Aortic Insufficiency secondary to Aortic valve endocarditis 2016 ? Admx w/ discitis vs osteomyelitis >> tx to [facility]; Bld Cx + Enterococcus ? AVR was recommended >> patient never had surgery/preferred to avoid surgery ? Echo 7/16: EF 50-55, mild to mod AI, PASP 57 ? Echo 2/17: mod LVH, EF 50-55, mod AI, mild MR, PASP 63 ? Echo 3/18: mod LVH, EF 60-65, mod AI, PASP 39 ? Echo 5/19: mild AI, EF 60-65  Peripheral Arterial Disease   ? eval by Dr. Gwenlyn Found in 10/2017 >> L foot wound not felt to be ischemic; no further eval needed  Hx of DVT 09/2015 ? Warfarin started during admission   Prior CV studies: Echocardiogram 07/29/2020 EF 40-45, mild LVH, normal RV SF, moderate MAC  Cardiac catheterization 07/27/2020 LAD proximal 99 OM1 100 (not clear if this was the infarct vessel; too small for PCI) Lateral RI 100 (chronic occlusion, small vessel) PCI: 3.5 x 18 mm Resolute DES to the mid LAD  Echocardiogram 11/20/2019 EF 55-60, moderate LVH, normal RVSF, mild MR, mild AI  Echocardiogram 02/23/18 Severe LVH, EF 60-65, no RWMA, Gr 3 DD, mild  AI  ABIs/LE arterial US 10/22/17 Right: Arterial obstruction involving the peroneal artery.  Left: Arterial obstruction involving the posterior tibial artery.   TEE 11/2014 at [place], severe AI, normal LVF  Echo 08/11/12 Mod LVH, EF 55, Gr 1 DD, mod MAC, mild LAE  Myoview 06/2010 Low risk, no ischemia, EF 52% Decreased activity in anterior base/mid-wall-may represent some scar    History of Present Illness:   [name] [name] was admitted to the hospital 10/16-10/24 with a non-STEMI complicated by atrial fibrillation with rapid ventricular rate.  He had a complex hospitalization.  He underwent cardiac catheterization which demonstrated severe proximal LAD stenosis.  This was treated with PCI with a drug-eluting stent.  He has a chronically occluded small lateral intermediate branch as well as an occluded OM1.  Residual CAD is treated medically.  Plan was to continue aspirin, Plavix and warfarin for 30 days, then discontinue aspirin.  EF was noted to be low at 40-45% on echocardiogram.  He had significant issues with hypotension and possible sepsis as well as bradycardia.  He required vasopressors as well as broad-spectrum IV antibiotics.  He was evaluated by EP and not felt to be a good candidate for pacemaker.  Pacemaker was not recommended.  Given his hypotension, bradycardia and ESRD, GDMT could not be initiated for his reduced EF.  There was some concern for possible cholecystitis.  His HIDA scan was negative and he  did not require surgery.  He had significant hypoglycemic episodes and his diabetic regimen was scaled back.  He had close follow-up with endocrinology post discharge.  He was to have close follow-up post discharge with cardiology for transitional care.  However, he canceled his initial appointment.  He is seen for follow-up.  Past Medical History:  Diagnosis Date   Allergic rhinitis    Allergy    Anemia    Aortic insufficiency    SBE in 2016 >> pt declined AVR // a.  Echo 7/16:  Mod LVH, EF 50-55%, mild to mod AI, severe LAE, PASP 57 mmHg (LV ID end diastolic 09.9 mm);  b. Echo 2/17:  Mod LVH, EF 50-55%, mod AI, MAC, mild MR, mod LAE, mild to mod TR, PASP 63 mmHg // Echo 2/21: normal EF, mild MR, mild AI   Atrial fibrillation and flutter (HCC)    chronic anticoag with Warfarin // bradycardia noted during admit 10/21   Bilateral carpal tunnel syndrome    Bradycardia    brady/low BP event during admit in 10/21 >> poor candidate for pacer/EPS due to high risk of infection with ESRD; pacer not recommended by EP   CAD (coronary artery disease)    s/p NSTEMI 10/21: cath>> pLAD 99 >> tx with DES; Lat RI 100 and OM1 100 - med Rx   Cataract    removed both eyes    Cholecystitis    dx during admx 10/21 >> HIDA neg // exam benign // high risk for surgery >> no surgical intervention recommended.    Claustrophobia    COPD (chronic obstructive pulmonary disease) (HCC)    Coronary artery disease    Diabetes mellitus    Type II   Diabetic peripheral neuropathy (HCC)    Diabetic retinopathy ([place])    Discitis of lumbar region    resolved   DVT (deep venous thrombosis) ([place]) 10/07/2015   LLE   Endocarditis 11/28/14   ESRD (end stage renal disease) on dialysis [facility])    s/p renal transplant in 2009, failed in 2016   Gait disorder    Gastroparesis    GERD (gastroesophageal reflux disease)    Gout    Hearing difficulty    Hemodialysis patient ([place])    tuesday, thursday, saturday    HFrEF (HF with reduced EF)    Ischemic CM // Echo 10/21: EF 40-45, dist sept and apical AK, mild LVH, normal RVSF, mod MAC   Hyperlipidemia    Hypertension    Incomplete bladder emptying    Leg pain 02/05/11   with walking   Morbid obesity ([place])    Obstructive sleep apnea    not using CPAP- lost 100lbs   Osteomyelitis (HCC)    Posttraumatic stress disorder    Rotator cuff disorder    Secondary hyperparathyroidism ([place])    Sepsis ([place])    Postive  blood cultures -    Sleep apnea    no cpap   Urethral stricture    Vitamin D deficiency    Past Surgical History:  Procedure Laterality Date   ARTERIOVENOUS GRAFT PLACEMENT Bilateral "several"   AV FISTULA PLACEMENT Bilateral 2015   AV FISTULA PLACEMENT Right 06/13/2019   Procedure: INSERTION OF ARTERIOVENOUS (AV) GORE-TEX GRAFT RIGHT  ARM;  Surgeon: Elam Dutch, MD;  Location: [place];  Service: Vascular;  Laterality: Right;   [place] Right 83/38/2505   BASCILIC VEIN TRANSPOSITION Right 09/27/2015   Procedure: BASILIC VEIN TRANSPOSITION right;  Surgeon: Nelda Severe  Kellie Simmering, MD;  Location: [place];  Service: Vascular;  Laterality: Right;   CATARACT EXTRACTION W/ INTRAOCULAR LENS  IMPLANT, BILATERAL Bilateral    CORONARY STENT INTERVENTION N/A 07/27/2020   Procedure: CORONARY STENT INTERVENTION;  Surgeon: Burnell Blanks, MD;  Location: [place] CV LAB;  Service: Cardiovascular;  Laterality: N/A;   CYSTOSCOPY W/ INTERNAL URETHROTOMY  09/2014   IR THROMBECTOMY AV FISTULA W/THROMBOLYSIS/PTA INC/SHUNT/IMG RIGHT Right 08/01/2019   IR US GUIDE VASC ACCESS RIGHT  08/01/2019   KIDNEY TRANSPLANT  2009   LEFT HEART CATH AND CORONARY ANGIOGRAPHY N/A 07/27/2020   Procedure: LEFT HEART CATH AND CORONARY ANGIOGRAPHY;  Surgeon: Burnell Blanks, MD;  Location: [place] [facility];  Service: Cardiovascular;  Laterality: N/A;   SIGMOIDOSCOPY     UPPER EXTREMITY VENOGRAPHY Bilateral 06/02/2019   Procedure: UPPER EXTREMITY VENOGRAPHY;  Surgeon: Elam Dutch, MD;  Location: [place] CV [facility];  Service: Cardiovascular;  Laterality: Bilateral;   UPPER GASTROINTESTINAL ENDOSCOPY       No outpatient medications have been marked as taking for the 08/30/20 encounter (Appointment) with Richardson Dopp T, PA-C.     Allergies:   Pork-derived products, Ace inhibitors, Hydrocodone, and Pork allergy   Social History   Tobacco Use   Smoking status: Never  Smoker   Smokeless tobacco: Never Used  Vaping Use   Vaping Use: Never used  Substance Use Topics   Alcohol use: No    Alcohol/week: 0.0 standard drinks   Drug use: No     Family Hx: The patient's family history includes Diabetes in his brother, father, and mother; Hypertension in his mother. There is no history of Colon cancer, Colon polyps, Esophageal cancer, Rectal cancer, or Stomach cancer.  ROS:   Please see the history of present illness.     All other systems reviewed and are negative.   Prior CV studies:   The following studies were reviewed today:    Labs/Other Tests and Data Reviewed:    EKG:    Recent Labs: 07/30/2020: ALT 26; Magnesium 2.3 08/03/2020: BUN 49; Creatinine, Ser 9.14; Potassium 4.2; Sodium 134 08/04/2020: Hemoglobin 10.3; Platelets 280   Recent Lipid Panel Lab Results  Component Value Date/Time   CHOL 93 05/13/2020 11:01 AM   TRIG 113.0 05/13/2020 11:01 AM   HDL 27.60 (L) 05/13/2020 11:01 AM   CHOLHDL 3 05/13/2020 11:01 AM   LDLCALC 43 05/13/2020 11:01 AM    Wt Readings from Last 3 Encounters:  08/21/20 292 lb (132.5 kg)  08/04/20 296 lb 9.6 oz (134.5 kg)  07/24/20 296 lb 6.4 oz (134.4 kg)     Risk Assessment/Calculations:     Objective:    Vital Signs:  There were no vitals taken for this visit.     ASSESSMENT & PLAN:    1.   1. Preoperative cardiovascular examination His risk of perioperative major cardiac event is elevated at 11% according to the revised cardiac risk index.  However, he is able to achieve 4 METS or greater.  Therefore, he does not require any further ischemic testing prior to his upcoming colonoscopy.  However, it has been more than a year since he last had an echocardiogram.  Given his valvular heart disease, he does need a follow-up echocardiogram prior to providing surgical risk recommendations.  This is scheduled for Monday November 20, 2019.  Final recommendations can be made after his echocardiogram  is reviewed.    2. Nonrheumatic aortic valve insufficiency 3. History of bacterial endocarditis History  of prior endocarditis with moderate to severe aortic insufficiency.  He never underwent surgery and his valve regurgitation did improve over time.  Most recently, his aortic regurgitation was mild.  His last echo was in May 2019.  Follow-up echo has already been scheduled for February 8.  Continue SBE prophylaxis.  4. Atrial flutter, unspecified type [facility]) He has paroxysmal atrial flutter.  He is asymptomatic.  He is chronically anticoagulated with Warfarin.  He was initially placed on warfarin for DVT.  He has not had a recurrence.  CHA2DS2-VASc=4.  He will remain on long-term anticoagulation with Warfarin.  This is managed by primary care.  He does have a pork allergy and is unable to take Lovenox.  He notes that he has held warfarin in the past without difficulty.  I did review his case with our clinical pharmacist in the Coumadin clinic regarding perioperative management of his Coumadin.  Since his CHA2DS2-VASc is < 6 and he has never had a stroke, he can hold Warfarin x 5 days prior to his procedure without bridging therapy.  His Warfarin should be resumed as soon as it is safe post op.    5. Chronic diastolic CHF (congestive heart failure) ([place]) Volume managed by dialysis.  6. ESRD on dialysis [facility]) He goes to dialysis Tuesday, Thursday, Saturday.  7. Essential hypertension Blood pressure runs low now on dialysis.  He is no longer on medical therapy.   Shared Decision Making/Informed Consent       COVID-19 Education: The signs and symptoms of COVID-19 were discussed with the patient and how to seek care for testing (follow up with PCP or arrange E-visit).  The importance of social distancing was discussed today.  Time:   Today, I have spent  minutes with the patient with telehealth technology discussing the above problems.     Medication Adjustments/Labs and Tests  Ordered: Current medicines are reviewed at length with the patient today.  Concerns regarding medicines are outlined above.   Tests Ordered: No orders of the defined types were placed in this encounter.   Medication Changes: No orders of the defined types were placed in this encounter.   Follow Up:     SignedRichardson Dopp, PA-C  08/29/2020 9:40 AM    [place] This encounter was created in error - please disregard.

## 2020-08-30 ENCOUNTER — Encounter: Payer: Medicare HMO | Admitting: Physician Assistant

## 2020-08-30 ENCOUNTER — Other Ambulatory Visit: Payer: Self-pay

## 2020-08-30 ENCOUNTER — Encounter: Payer: Self-pay | Admitting: Physician Assistant

## 2020-08-30 DIAGNOSIS — I5020 Unspecified systolic (congestive) heart failure: Secondary | ICD-10-CM

## 2020-08-30 DIAGNOSIS — Z794 Long term (current) use of insulin: Secondary | ICD-10-CM

## 2020-08-30 DIAGNOSIS — I351 Nonrheumatic aortic (valve) insufficiency: Secondary | ICD-10-CM

## 2020-08-30 DIAGNOSIS — I4819 Other persistent atrial fibrillation: Secondary | ICD-10-CM

## 2020-08-30 DIAGNOSIS — I214 Non-ST elevation (NSTEMI) myocardial infarction: Secondary | ICD-10-CM

## 2020-08-30 DIAGNOSIS — N186 End stage renal disease: Secondary | ICD-10-CM

## 2020-08-30 DIAGNOSIS — E1122 Type 2 diabetes mellitus with diabetic chronic kidney disease: Secondary | ICD-10-CM

## 2020-08-30 LAB — POCT INR: INR: 2.1 (ref 2.0–3.0)

## 2020-08-31 DIAGNOSIS — N2581 Secondary hyperparathyroidism of renal origin: Secondary | ICD-10-CM | POA: Diagnosis not present

## 2020-08-31 DIAGNOSIS — Z992 Dependence on renal dialysis: Secondary | ICD-10-CM | POA: Diagnosis not present

## 2020-08-31 DIAGNOSIS — N186 End stage renal disease: Secondary | ICD-10-CM | POA: Diagnosis not present

## 2020-09-02 ENCOUNTER — Ambulatory Visit (INDEPENDENT_AMBULATORY_CARE_PROVIDER_SITE_OTHER): Payer: Medicare HMO | Admitting: General Practice

## 2020-09-02 DIAGNOSIS — Z7901 Long term (current) use of anticoagulants: Secondary | ICD-10-CM | POA: Diagnosis not present

## 2020-09-02 DIAGNOSIS — Z992 Dependence on renal dialysis: Secondary | ICD-10-CM | POA: Diagnosis not present

## 2020-09-02 DIAGNOSIS — N186 End stage renal disease: Secondary | ICD-10-CM | POA: Diagnosis not present

## 2020-09-02 DIAGNOSIS — N2581 Secondary hyperparathyroidism of renal origin: Secondary | ICD-10-CM | POA: Diagnosis not present

## 2020-09-02 NOTE — Patient Instructions (Signed)
Pre visit review using our clinic review tool, if applicable. No additional management support is needed unless otherwise documented below in the visit note. 

## 2020-09-02 NOTE — Telephone Encounter (Signed)
Left a Vm letting him know he needs to schedule an appt to get refills on the Clonazepam.

## 2020-09-04 DIAGNOSIS — N186 End stage renal disease: Secondary | ICD-10-CM | POA: Diagnosis not present

## 2020-09-04 DIAGNOSIS — Z992 Dependence on renal dialysis: Secondary | ICD-10-CM | POA: Diagnosis not present

## 2020-09-04 DIAGNOSIS — N2581 Secondary hyperparathyroidism of renal origin: Secondary | ICD-10-CM | POA: Diagnosis not present

## 2020-09-06 ENCOUNTER — Other Ambulatory Visit: Payer: Self-pay

## 2020-09-06 ENCOUNTER — Other Ambulatory Visit: Payer: Medicare HMO | Admitting: Internal Medicine

## 2020-09-07 DIAGNOSIS — N186 End stage renal disease: Secondary | ICD-10-CM | POA: Diagnosis not present

## 2020-09-07 DIAGNOSIS — N2581 Secondary hyperparathyroidism of renal origin: Secondary | ICD-10-CM | POA: Diagnosis not present

## 2020-09-07 DIAGNOSIS — Z992 Dependence on renal dialysis: Secondary | ICD-10-CM | POA: Diagnosis not present

## 2020-09-09 ENCOUNTER — Other Ambulatory Visit: Payer: Medicaid Other | Admitting: Internal Medicine

## 2020-09-10 DIAGNOSIS — E1122 Type 2 diabetes mellitus with diabetic chronic kidney disease: Secondary | ICD-10-CM | POA: Diagnosis not present

## 2020-09-10 DIAGNOSIS — N2581 Secondary hyperparathyroidism of renal origin: Secondary | ICD-10-CM | POA: Diagnosis not present

## 2020-09-10 DIAGNOSIS — Z992 Dependence on renal dialysis: Secondary | ICD-10-CM | POA: Diagnosis not present

## 2020-09-10 DIAGNOSIS — N186 End stage renal disease: Secondary | ICD-10-CM | POA: Diagnosis not present

## 2020-09-12 DIAGNOSIS — E8770 Fluid overload, unspecified: Secondary | ICD-10-CM | POA: Diagnosis not present

## 2020-09-12 DIAGNOSIS — N2581 Secondary hyperparathyroidism of renal origin: Secondary | ICD-10-CM | POA: Diagnosis not present

## 2020-09-12 DIAGNOSIS — Z992 Dependence on renal dialysis: Secondary | ICD-10-CM | POA: Diagnosis not present

## 2020-09-12 DIAGNOSIS — N186 End stage renal disease: Secondary | ICD-10-CM | POA: Diagnosis not present

## 2020-09-14 DIAGNOSIS — Z992 Dependence on renal dialysis: Secondary | ICD-10-CM | POA: Diagnosis not present

## 2020-09-14 DIAGNOSIS — N2581 Secondary hyperparathyroidism of renal origin: Secondary | ICD-10-CM | POA: Diagnosis not present

## 2020-09-14 DIAGNOSIS — E8770 Fluid overload, unspecified: Secondary | ICD-10-CM | POA: Diagnosis not present

## 2020-09-14 DIAGNOSIS — N186 End stage renal disease: Secondary | ICD-10-CM | POA: Diagnosis not present

## 2020-09-17 DIAGNOSIS — E8770 Fluid overload, unspecified: Secondary | ICD-10-CM | POA: Diagnosis not present

## 2020-09-17 DIAGNOSIS — N2581 Secondary hyperparathyroidism of renal origin: Secondary | ICD-10-CM | POA: Diagnosis not present

## 2020-09-17 DIAGNOSIS — N186 End stage renal disease: Secondary | ICD-10-CM | POA: Diagnosis not present

## 2020-09-17 DIAGNOSIS — Z992 Dependence on renal dialysis: Secondary | ICD-10-CM | POA: Diagnosis not present

## 2020-09-18 ENCOUNTER — Ambulatory Visit: Payer: Medicare HMO | Admitting: Podiatry

## 2020-09-19 DIAGNOSIS — N2581 Secondary hyperparathyroidism of renal origin: Secondary | ICD-10-CM | POA: Diagnosis not present

## 2020-09-19 DIAGNOSIS — N186 End stage renal disease: Secondary | ICD-10-CM | POA: Diagnosis not present

## 2020-09-19 DIAGNOSIS — Z992 Dependence on renal dialysis: Secondary | ICD-10-CM | POA: Diagnosis not present

## 2020-09-19 DIAGNOSIS — E8770 Fluid overload, unspecified: Secondary | ICD-10-CM | POA: Diagnosis not present

## 2020-09-20 ENCOUNTER — Other Ambulatory Visit: Payer: Self-pay

## 2020-09-20 ENCOUNTER — Ambulatory Visit: Payer: Medicare HMO | Admitting: Internal Medicine

## 2020-09-21 DIAGNOSIS — N2581 Secondary hyperparathyroidism of renal origin: Secondary | ICD-10-CM | POA: Diagnosis not present

## 2020-09-21 DIAGNOSIS — N186 End stage renal disease: Secondary | ICD-10-CM | POA: Diagnosis not present

## 2020-09-21 DIAGNOSIS — Z992 Dependence on renal dialysis: Secondary | ICD-10-CM | POA: Diagnosis not present

## 2020-09-21 DIAGNOSIS — E8770 Fluid overload, unspecified: Secondary | ICD-10-CM | POA: Diagnosis not present

## 2020-09-24 DIAGNOSIS — E8770 Fluid overload, unspecified: Secondary | ICD-10-CM | POA: Diagnosis not present

## 2020-09-24 DIAGNOSIS — N2581 Secondary hyperparathyroidism of renal origin: Secondary | ICD-10-CM | POA: Diagnosis not present

## 2020-09-24 DIAGNOSIS — N186 End stage renal disease: Secondary | ICD-10-CM | POA: Diagnosis not present

## 2020-09-24 DIAGNOSIS — Z992 Dependence on renal dialysis: Secondary | ICD-10-CM | POA: Diagnosis not present

## 2020-09-25 ENCOUNTER — Ambulatory Visit: Payer: Medicare HMO | Admitting: Podiatry

## 2020-09-25 ENCOUNTER — Other Ambulatory Visit: Payer: Self-pay

## 2020-09-25 DIAGNOSIS — N186 End stage renal disease: Secondary | ICD-10-CM

## 2020-09-25 NOTE — Progress Notes (Signed)
Chief Complaint  Patient presents with  . Follow-up    CAD   History of Present Illness: 54 yo [name] male with history of aortic valve insufficiency, DM, HTN, HLD, morbid obesity, ESRD on HD, prior endocarditis, atrial fibrillation and DVT who is here today for cardiac follow up. He is s/p failed kidney transpant in 2009 at [facility] in [place]. I saw him for the first time in 2011 for evaluation of chest pain. He has chronic leg weakness and swelling and is mostly immobile. Echo in 2011 showed moderate LVH with normal LV systolic function. Since then he has had evidence of aortic valve insufficiency. He was admitted to [facility] and then [facility] in 2016 with discitis and he had evidence of aortic valve endocarditis. The plan was for antibiotics and then surgery but he did not tolerate his cardiac cath and no surgery was performed. He missed all f/u appts at [facility] with the cardiology division. He did see ID at [facility] and blood cultures normalized. Echo February 2021 with LVEF=55-60%, mild MR and mild AI. He has a prior DVT and has been on coumadin.  He was found to be in atrial flutter in January 2020 and was seen in the atrial fibrillation clinic and no changes were recommended. He was seen in our office October 2021 by Truitt Merle, NP and c/o chest pressure. A stress test was arranged but he was admitted to [facility] 07/27/20 with chest pain and atrial fib with RVR and ruled in for a NSTEMI. Cardiac cath 07/27/20 with severe stenosis in the proximal LAD treated with a drug eluting stent. The ramus intermediate branch and OM were chronically occluded. Echo 07/29/20 with LVEF=40-45%, no significant valve disease noted. Possible septic shock during the admission. Beta blocker stopped due to bradycardia. No ARB or Ace-inh added due to hypotension. Coumadin continued with rate controlled atrial flutter.   He is here today for follow up. The patient denies any chest pain, dyspnea, palpitations,  lower extremity edema, orthopnea, PND, dizziness, near syncope or syncope.    Primary Care Physician: Hoyt Koch, MD   Past Medical History:  Diagnosis Date  . Allergic rhinitis   . Allergy   . Anemia   . Aortic insufficiency    SBE in 2016 >> pt declined AVR // a. Echo 7/16:  Mod LVH, EF 50-55%, mild to mod AI, severe LAE, PASP 57 mmHg (LV ID end diastolic 62.8 mm);  b. Echo 2/17:  Mod LVH, EF 50-55%, mod AI, MAC, mild MR, mod LAE, mild to mod TR, PASP 63 mmHg // Echo 2/21: normal EF, mild MR, mild AI  . Atrial fibrillation and flutter ([place])    chronic anticoag with Warfarin // bradycardia noted during admit 10/21  . Bilateral carpal tunnel syndrome   . Bradycardia    brady/low BP event during admit in 10/21 >> poor candidate for pacer/EPS due to high risk of infection with ESRD; pacer not recommended by EP  . CAD (coronary artery disease)    s/p NSTEMI 10/21: cath>> pLAD 99 >> tx with DES; Lat RI 100 and OM1 100 - med Rx  . Cataract    removed both eyes   . Cholecystitis    dx during admx 10/21 >> HIDA neg // exam benign // high risk for surgery >> no surgical intervention recommended.   . Claustrophobia   . COPD (chronic obstructive pulmonary disease) ([place])   . Coronary artery disease   . Diabetes mellitus  Type II  . Diabetic peripheral neuropathy ([place])   . Diabetic retinopathy ([place])   . Discitis of lumbar region    resolved  . DVT (deep venous thrombosis) ([place]) 10/07/2015   LLE  . Endocarditis 11/28/14  . ESRD (end stage renal disease) on dialysis [facility])    s/p renal transplant in 2009, failed in 2016  . Gait disorder   . Gastroparesis   . GERD (gastroesophageal reflux disease)   . Gout   . Hearing difficulty   . Hemodialysis patient ([place])    tuesday, thursday, saturday   . HFrEF (HF with reduced EF)    Ischemic CM // Echo 10/21: EF 40-45, dist sept and apical AK, mild LVH, normal RVSF, mod MAC  . Hyperlipidemia   . Hypertension   . Incomplete bladder  emptying   . Leg pain 02/05/11   with walking  . Morbid obesity ([place])   . Obstructive sleep apnea    not using CPAP- lost 100lbs  . Osteomyelitis ([place])   . Posttraumatic stress disorder   . Rotator cuff disorder   . Secondary hyperparathyroidism ([place])   . Sepsis ([place])    Postive blood cultures -   . Sleep apnea    no cpap  . Urethral stricture   . Vitamin D deficiency     Past Surgical History:  Procedure Laterality Date  . ARTERIOVENOUS GRAFT PLACEMENT Bilateral "several"  . AV FISTULA PLACEMENT Bilateral 2015  . AV FISTULA PLACEMENT Right 06/13/2019   Procedure: INSERTION OF ARTERIOVENOUS (AV) GORE-TEX GRAFT RIGHT  ARM;  Surgeon: Elam Dutch, MD;  Location: [facility];  Service: Vascular;  Laterality: Right;  . BASCILIC VEIN TRANSPOSITION Right 09/27/2015  . [place] Right 09/27/2015   Procedure: BASILIC VEIN TRANSPOSITION right;  Surgeon: Mal Misty, MD;  Location: [facility];  Service: Vascular;  Laterality: Right;  . CATARACT EXTRACTION W/ INTRAOCULAR LENS  IMPLANT, BILATERAL Bilateral   . CORONARY STENT INTERVENTION N/A 07/27/2020   Procedure: CORONARY STENT INTERVENTION;  Surgeon: Burnell Blanks, MD;  Location: [place] [facility];  Service: Cardiovascular;  Laterality: N/A;  . CYSTOSCOPY W/ INTERNAL URETHROTOMY  09/2014  . IR THROMBECTOMY AV FISTULA W/THROMBOLYSIS/PTA INC/SHUNT/IMG RIGHT Right 08/01/2019  . IR US GUIDE VASC ACCESS RIGHT  08/01/2019  . KIDNEY TRANSPLANT  2009  . LEFT HEART CATH AND CORONARY ANGIOGRAPHY N/A 07/27/2020   Procedure: LEFT HEART CATH AND CORONARY ANGIOGRAPHY;  Surgeon: Burnell Blanks, MD;  Location: [place] [facility];  Service: Cardiovascular;  Laterality: N/A;  . SIGMOIDOSCOPY    . UPPER EXTREMITY VENOGRAPHY Bilateral 06/02/2019   Procedure: UPPER EXTREMITY VENOGRAPHY;  Surgeon: Elam Dutch, MD;  Location: [place] [facility];  Service: Cardiovascular;  Laterality: Bilateral;  . UPPER GASTROINTESTINAL  ENDOSCOPY      Current Outpatient Medications  Medication Sig Dispense Refill  . Accu-Chek FastClix Lancets MISC USE TO CHECK BLOOD SUGARS 4 TIMES A DAY. 360 each 4  . acetaminophen (TYLENOL) 325 MG tablet Take 2 tablets (650 mg total) by mouth every 4 (four) hours as needed for headache or mild pain.    Marland Kitchen allopurinol (ZYLOPRIM) 100 MG tablet Take 1 tablet (100 mg total) by mouth 3 (three) times a week. After dialysis 30 tablet 6  . atorvastatin (LIPITOR) 80 MG tablet Take 1 tablet (80 mg total) by mouth daily. 90 tablet 3  . AURYXIA 1 GM 210 MG(Fe) tablet Take 210 mg by mouth 3 (three) times daily with meals.     Marland Kitchen  busPIRone (BUSPAR) 5 MG tablet     . clonazePAM (KLONOPIN) 0.5 MG tablet Take 1 tablet (0.5 mg total) by mouth 2 (two) times daily as needed for anxiety. 60 tablet 0  . docusate sodium (STOOL SOFTENER) 100 MG capsule Take 1 capsule (100 mg total) by mouth 2 (two) times daily. 30 capsule 1  . glucose blood (ACCU-CHEK GUIDE) test strip USE AS INSTRUCTED TO CHECK BLOOD SUGAR 3 TIMES DAILY AS NEEDED 300 strip 12  . hydrocortisone 1 % ointment Apply 1 application topically 2 (two) times daily. 30 g 0  . hydrocortisone-pramoxine (PROCTOFOAM-HC) rectal foam Place 1 applicator rectally 2 (two) times daily. 30 g 0  . insulin glargine, 2 Unit Dial, (TOUJEO MAX SOLOSTAR) 300 UNIT/ML Solostar Pen Inject 40 units in the morning and 40 units in the evening. 48 mL 1  . Insulin Pen Needle (DROPLET PEN NEEDLES) 32G X 4 MM MISC USE  TO INJECT FIVE TIMES DAILY AS DIRECTED 500 each 1  . Insulin Pen Needle 32G X 8 MM MISC Use with insulin x5 times a day (Patient taking differently: 1 each by Other route See admin instructions. Use with insulin x5 times a day) 500 each 1  . insulin regular human CONCENTRATED (HUMULIN R U-500 KWIKPEN) 500 UNIT/ML kwikpen INJECT 50 UNITS UNDER THE SKIN AT BREAKFAST, 70 UNITS AT LUNCH, AND 60 UNITS AT DINNER. 12 mL 1  . Methoxy PEG-Epoetin Beta (MIRCERA IJ) Mircera    .  midodrine (PROAMATINE) 10 MG tablet Take 1 tablet (10 mg total) by mouth 3 (three) times daily with meals. 90 tablet 11  . omeprazole (PRILOSEC) 20 MG capsule TAKE 1 CAPSULE EVERY DAY, ANNUAL APPOINTMENT DUE IN OCTOBER MUST SEE PROVIDER FOR FUTURE REFILLS. 90 capsule 0  . pantoprazole (PROTONIX) 40 MG tablet Take 1 tablet (40 mg total) by mouth daily. 30 tablet 11  . RENVELA 800 MG tablet Take 2,400 mg by mouth 3 (three) times daily with meals.     . sodium zirconium cyclosilicate (LOKELMA) 10 g PACK packet Take 10 g by mouth daily.     . VELTASSA 8.4 g packet Take 8.4 g by mouth daily.     Marland Kitchen warfarin (COUMADIN) 5 MG tablet Take 1 tablet (5 mg total) by mouth See admin instructions. TAKE 1 TABLET EVERY DAY OR AS DIRECTED BY COUMADIN CLINIC. 100 tablet 1  . clopidogrel (PLAVIX) 75 MG tablet Take 1 tablet (75 mg total) by mouth daily with breakfast. 90 tablet 3   No current facility-administered medications for this visit.    Allergies  Allergen Reactions  . Pork-Derived Products Anaphylaxis and Other (See Comments)    Fever also   . Ace Inhibitors Cough  . Hydrocodone Other (See Comments)    Hallucinations  . Pork Allergy     Other reaction(s): Unknown    Social History   Socioeconomic History  . Marital status: Divorced    Spouse name: Not on file  . Number of children: 0  . Years of education: College   . Highest education level: Bachelor's degree (e.g., BA, AB, BS)  Occupational History    Employer: UNEMPLOYED  Tobacco Use  . Smoking status: Never Smoker  . Smokeless tobacco: Never Used  Vaping Use  . Vaping Use: Never used  Substance and Sexual Activity  . Alcohol use: No    Alcohol/week: 0.0 standard drinks  . Drug use: No  . Sexual activity: Not Currently  Other Topics Concern  . Not on file  Social History Narrative   Patient is Divorced.   Patient does not have any children.   Patient is right-handed   Patient is currently in college working on his Ph.D.       Social Determinants of Health   Financial Resource Strain: Low Risk   . Difficulty of Paying Living Expenses: Not very hard  Food Insecurity: No Food Insecurity  . Worried About Charity fundraiser in the Last Year: Never true  . Ran Out of Food in the Last Year: Never true  Transportation Needs: No Transportation Needs  . Lack of Transportation (Medical): No  . Lack of Transportation (Non-Medical): No  Physical Activity: Inactive  . Days of Exercise per Week: 0 days  . Minutes of Exercise per Session: 0 min  Stress: Stress Concern Present  . Feeling of Stress : Very much  Social Connections: Moderately Isolated  . Frequency of Communication with Friends and Family: More than three times a week  . Frequency of Social Gatherings with Friends and Family: More than three times a week  . Attends Religious Services: 1 to 4 times per year  . Active Member of Clubs or Organizations: No  . Attends Archivist Meetings: Never  . Marital Status: Divorced  Human resources officer Violence: Not At Risk  . Fear of Current or Ex-Partner: No  . Emotionally Abused: No  . Physically Abused: No  . Sexually Abused: No    Family History  Problem Relation Age of Onset  . Hypertension Mother   . Diabetes Mother   . Diabetes Father   . Diabetes Brother   . Colon cancer Neg Hx   . Colon polyps Neg Hx   . Esophageal cancer Neg Hx   . Rectal cancer Neg Hx   . Stomach cancer Neg Hx     Review of Systems:  As stated in the HPI and otherwise negative.   BP 128/76   Pulse 97   Ht _0  (1.778 m)   Wt (!) 304 lb 3.2 oz (138 kg)   SpO2 96%   BMI 43.65 kg/m   Physical Examination:  General: Well developed, well nourished, NAD  HEENT: OP clear, mucus membranes moist  SKIN: warm, dry. No rashes. Neuro: No focal deficits  Musculoskeletal: Muscle strength 5/5 all ext  Psychiatric: Mood and affect normal  Neck: No JVD, no carotid bruits, no thyromegaly, no lymphadenopathy.  Lungs:Clear  bilaterally, no wheezes, rhonci, crackles Cardiovascular: Regular rate and rhythm. No murmurs, gallops or rubs. Abdomen:Soft. Bowel sounds present. Non-tender.  Extremities: No lower extremity edema. Pulses are 2 + in the bilateral DP/PT.  Echo October 2021:   1. Extremely limited; probable distal septal and apical akinesis with  overall mild to moderate LV dysfunction.  2. Left ventricular ejection fraction, by estimation, is 40 to 45%. The  left ventricle has mildly decreased function. The left ventricle  demonstrates regional wall motion abnormalities (see scoring  diagram/findings for description). There is mild left  ventricular hypertrophy. Left ventricular diastolic function could not be  evaluated.  3. Right ventricular systolic function is normal. The right ventricular  size is normal.  4. The mitral valve is normal in structure. No evidence of mitral valve  regurgitation. No evidence of mitral stenosis. Moderate mitral annular  calcification.  5. The aortic valve is tricuspid. Aortic valve regurgitation is not  visualized. No aortic stenosis is present.  6. The inferior vena cava is dilated in size with <50% respiratory  variability, suggesting  right atrial pressure of 15 mmHg.   EKG:  EKG is not  ordered today. The ekg ordered today demonstrates   Recent Labs: 07/30/2020: ALT 26; Magnesium 2.3 08/03/2020: BUN 49; Creatinine, Ser 9.14; Potassium 4.2; Sodium 134 08/04/2020: Hemoglobin 10.3; Platelets 280    Wt Readings from Last 3 Encounters:  09/26/20 (!) 304 lb 3.2 oz (138 kg)  08/21/20 292 lb (132.5 kg)  08/04/20 296 lb 9.6 oz (134.5 kg)     Other studies Reviewed: Additional studies/ records that were reviewed today include: . Review of the above records demonstrates:    Assessment and Plan:   1. Aortic insufficiency: No significant AI by most recent echo in October 2021.     2. Hypertensive heart disease with heart failure : BP has been low on HD.  He is on no anti-hypertensive therapy.   3. Morbid obesity: Wt loss is recommended  4. Chronic diastolic CHF: Volume controlled with dialysis.   5. HLD: LDL 43 in August 2021. Continue statin  6. End stage renal disease: He is on HD.  7.  Atrial flutter, paroxysmal: Sinus on exam today. Continue coumadin. (He is also on this because of prior clotting issues with HD access).   8. CAD without angina: No chest following his PCI. Continue Plavix and statin. He is now on atorvastatin 80 mg daily. Will remove Crestor from his list. No ASA since he is also on Coumadin. No beta blocker with hypotension on dialysis.   Current medicines are reviewed at length with the patient today.  The patient does not have concerns regarding medicines.  The following changes have been made:  no change  Labs/ tests ordered today include:   No orders of the defined types were placed in this encounter.    Disposition:   FU with me in months   Signed, Lauree Chandler, MD 09/26/2020 9:54 AM    [address], [place], [place]  [address] Phone: [phone]; Fax: [phone]

## 2020-09-26 ENCOUNTER — Encounter: Payer: Self-pay | Admitting: Cardiovascular Disease

## 2020-09-26 ENCOUNTER — Other Ambulatory Visit: Payer: Self-pay

## 2020-09-26 ENCOUNTER — Ambulatory Visit (INDEPENDENT_AMBULATORY_CARE_PROVIDER_SITE_OTHER): Payer: Medicare HMO | Admitting: Cardiovascular Disease

## 2020-09-26 ENCOUNTER — Telehealth: Payer: Self-pay

## 2020-09-26 VITALS — BP 128/76 | HR 97 | Ht 70.0 in | Wt 304.2 lb

## 2020-09-26 DIAGNOSIS — N186 End stage renal disease: Secondary | ICD-10-CM | POA: Diagnosis not present

## 2020-09-26 DIAGNOSIS — I5032 Chronic diastolic (congestive) heart failure: Secondary | ICD-10-CM

## 2020-09-26 DIAGNOSIS — I351 Nonrheumatic aortic (valve) insufficiency: Secondary | ICD-10-CM | POA: Diagnosis not present

## 2020-09-26 DIAGNOSIS — Z992 Dependence on renal dialysis: Secondary | ICD-10-CM | POA: Diagnosis not present

## 2020-09-26 DIAGNOSIS — I4892 Unspecified atrial flutter: Secondary | ICD-10-CM | POA: Diagnosis not present

## 2020-09-26 DIAGNOSIS — E8770 Fluid overload, unspecified: Secondary | ICD-10-CM | POA: Diagnosis not present

## 2020-09-26 DIAGNOSIS — I2510 Atherosclerotic heart disease of native coronary artery without angina pectoris: Secondary | ICD-10-CM

## 2020-09-26 DIAGNOSIS — N2581 Secondary hyperparathyroidism of renal origin: Secondary | ICD-10-CM | POA: Diagnosis not present

## 2020-09-26 MED ORDER — MIDODRINE HCL 10 MG PO TABS: 10.0000 mg | ORAL_TABLET | Freq: Three times a day (TID) | ORAL | 10 refills | Status: DC

## 2020-09-26 MED ORDER — CLOPIDOGREL BISULFATE 75 MG PO TABS
75.0000 mg | ORAL_TABLET | Freq: Every day | ORAL | 3 refills | Status: DC
Start: 2020-09-26 — End: 2021-02-18

## 2020-09-26 NOTE — Telephone Encounter (Addendum)
Multiple paper requests faxed from [facility] stating that the pt would like Midodrine prescription to be sent to [facility] mail delivery.

## 2020-09-26 NOTE — Progress Notes (Signed)
Letter forwarded to patient in [place].

## 2020-09-26 NOTE — Addendum Note (Signed)
Addended by: Elza Rafter D on: 09/26/2020 04:30 PM   Modules accepted: Orders

## 2020-09-26 NOTE — Patient Instructions (Signed)
Medication Instructions:  No changes *If you need a refill on your cardiac medications before your next appointment, please call your pharmacy*   Lab Work: none If you have labs (blood work) drawn today and your tests are completely normal, you will receive your results only by: Marland Kitchen MyChart Message (if you have MyChart) OR . A paper copy in the mail If you have any lab test that is abnormal or we need to change your treatment, we will call you to review the results.   Testing/Procedures: none   Follow-Up: 6 months  Other Instructions We will send your information to [facility].  They will contact you to discuss the program/arrange an appointment.

## 2020-09-26 NOTE — Telephone Encounter (Signed)
Last refill per controlled substance database: 07/23/20; has never been prescribed/refilled by current PCP Last OV: 03/22/20 Next OV: None scheduled

## 2020-09-27 ENCOUNTER — Telehealth: Payer: Self-pay | Admitting: Cardiovascular Disease

## 2020-09-27 NOTE — Telephone Encounter (Signed)
I think his nephrologist was prescribing midodrine for him due to syncope at dialysis in the past.

## 2020-09-27 NOTE — Telephone Encounter (Signed)
Patient calling back stating he is coming to the office for the letter now.

## 2020-09-27 NOTE — Telephone Encounter (Signed)
Patient called and said that he could not find the letter that Dr. Angelena Form sent him via [place]. He would like to come pick up a copy in person

## 2020-09-27 NOTE — Telephone Encounter (Signed)
Spoke with the patient and advised him that I have printed out the letter and left it at the front desk for him to pick up.

## 2020-09-28 DIAGNOSIS — N2581 Secondary hyperparathyroidism of renal origin: Secondary | ICD-10-CM | POA: Diagnosis not present

## 2020-09-28 DIAGNOSIS — N186 End stage renal disease: Secondary | ICD-10-CM | POA: Diagnosis not present

## 2020-09-28 DIAGNOSIS — E8770 Fluid overload, unspecified: Secondary | ICD-10-CM | POA: Diagnosis not present

## 2020-09-28 DIAGNOSIS — Z992 Dependence on renal dialysis: Secondary | ICD-10-CM | POA: Diagnosis not present

## 2020-09-30 DIAGNOSIS — N2581 Secondary hyperparathyroidism of renal origin: Secondary | ICD-10-CM | POA: Diagnosis not present

## 2020-09-30 DIAGNOSIS — N186 End stage renal disease: Secondary | ICD-10-CM | POA: Diagnosis not present

## 2020-09-30 DIAGNOSIS — Z992 Dependence on renal dialysis: Secondary | ICD-10-CM | POA: Diagnosis not present

## 2020-09-30 DIAGNOSIS — E8770 Fluid overload, unspecified: Secondary | ICD-10-CM | POA: Diagnosis not present

## 2020-10-01 DIAGNOSIS — N186 End stage renal disease: Secondary | ICD-10-CM | POA: Diagnosis not present

## 2020-10-01 DIAGNOSIS — Z992 Dependence on renal dialysis: Secondary | ICD-10-CM | POA: Diagnosis not present

## 2020-10-01 DIAGNOSIS — N2581 Secondary hyperparathyroidism of renal origin: Secondary | ICD-10-CM | POA: Diagnosis not present

## 2020-10-01 DIAGNOSIS — E8770 Fluid overload, unspecified: Secondary | ICD-10-CM | POA: Diagnosis not present

## 2020-10-02 ENCOUNTER — Ambulatory Visit: Payer: Medicare HMO | Admitting: Internal Medicine

## 2020-10-03 ENCOUNTER — Ambulatory Visit: Payer: Medicare HMO | Admitting: Internal Medicine

## 2020-10-03 DIAGNOSIS — N186 End stage renal disease: Secondary | ICD-10-CM | POA: Diagnosis not present

## 2020-10-03 DIAGNOSIS — E8770 Fluid overload, unspecified: Secondary | ICD-10-CM | POA: Diagnosis not present

## 2020-10-03 DIAGNOSIS — Z992 Dependence on renal dialysis: Secondary | ICD-10-CM | POA: Diagnosis not present

## 2020-10-03 DIAGNOSIS — N2581 Secondary hyperparathyroidism of renal origin: Secondary | ICD-10-CM | POA: Diagnosis not present

## 2020-10-06 DIAGNOSIS — Z992 Dependence on renal dialysis: Secondary | ICD-10-CM | POA: Diagnosis not present

## 2020-10-06 DIAGNOSIS — N2581 Secondary hyperparathyroidism of renal origin: Secondary | ICD-10-CM | POA: Diagnosis not present

## 2020-10-06 DIAGNOSIS — E8770 Fluid overload, unspecified: Secondary | ICD-10-CM | POA: Diagnosis not present

## 2020-10-06 DIAGNOSIS — N186 End stage renal disease: Secondary | ICD-10-CM | POA: Diagnosis not present

## 2020-10-08 ENCOUNTER — Ambulatory Visit (INDEPENDENT_AMBULATORY_CARE_PROVIDER_SITE_OTHER): Payer: Medicare HMO | Admitting: Vascular Surgery

## 2020-10-08 ENCOUNTER — Encounter: Payer: Self-pay | Admitting: Vascular Surgery

## 2020-10-08 ENCOUNTER — Ambulatory Visit (HOSPITAL_COMMUNITY)
Admission: RE | Admit: 2020-10-08 | Discharge: 2020-10-08 | Disposition: A | Discharge status: Home / Self Care | Payer: Medicare HMO | Source: Ambulatory Visit | Attending: Vascular Surgery | Admitting: Vascular Surgery

## 2020-10-08 ENCOUNTER — Ambulatory Visit (INDEPENDENT_AMBULATORY_CARE_PROVIDER_SITE_OTHER)
Admission: RE | Admit: 2020-10-08 | Discharge: 2020-10-08 | Disposition: A | Discharge status: Home / Self Care | Payer: Medicare HMO | Source: Ambulatory Visit | Attending: Vascular Surgery | Admitting: Vascular Surgery

## 2020-10-08 ENCOUNTER — Other Ambulatory Visit: Payer: Self-pay

## 2020-10-08 VITALS — BP 101/47 | HR 101 | Temp 97.7°F | Resp 20 | Ht 70.0 in | Wt 304.0 lb

## 2020-10-08 DIAGNOSIS — N186 End stage renal disease: Secondary | ICD-10-CM

## 2020-10-08 DIAGNOSIS — E8770 Fluid overload, unspecified: Secondary | ICD-10-CM | POA: Diagnosis not present

## 2020-10-08 DIAGNOSIS — Z992 Dependence on renal dialysis: Secondary | ICD-10-CM

## 2020-10-08 DIAGNOSIS — N2581 Secondary hyperparathyroidism of renal origin: Secondary | ICD-10-CM | POA: Diagnosis not present

## 2020-10-08 NOTE — Progress Notes (Signed)
ASSESSMENT & PLAN:  54 y.o. male with ESRD on HD. He has failed multiple bilateral upper extremity access. He recently underwent right upper extremity AVG to very proximal axillary vein 06/12/20. This has unfortunately thrombosed. 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 He has a RIJ TDC in place. His right central veins appear patent on recent venogram. He is not a candidate for a thigh graft because of his large pannus.   Will plan for RUE HeRO graft 54 as co-case with Dr. Donzetta Matters. as co-case with Dr. Donzetta Matters.  CHIEF COMPLAINT:   Needs HD access.  HISTORY:  HISTORY OF PRESENT ILLNESS: [name] [name] is a 54 y.o. male who presents to clinic for evaluation of new HD access.  He has failed multiple accesses in the past.  He has no usable upper extremity vein.  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  The right central axillary vein appeared patent.  This was followed by right upper extremity brachial axillary AV graft 06/12/2020.  This is unfortunately thrombosed.  He returns the clinic looking for new access.  He is currently dialyzing through a right IJ [facility]  Past Medical History:  Diagnosis Date  . Allergic rhinitis   . Allergy   . Anemia   . Aortic insufficiency    SBE in 2016 >> pt declined AVR // a. Echo 7/16:  Mod LVH, EF 50-55%, mild to mod AI, severe LAE, PASP 57 mmHg (LV ID end diastolic 05.3 mm);  b. Echo 2/17:  Mod LVH, EF 50-55%, mod AI, MAC, mild MR, mod LAE, mild to mod TR, PASP 63 mmHg // Echo 2/21: normal EF, mild MR, mild AI  . Atrial fibrillation and flutter ([place])    chronic anticoag with Warfarin // bradycardia noted during admit 10/21  . Bilateral carpal tunnel syndrome   . Bradycardia    brady/low BP event during admit in 10/21 >> poor candidate for pacer/EPS due to high risk of infection with ESRD; pacer not recommended by EP  . CAD (coronary artery disease)    s/p NSTEMI  10/21: cath>> pLAD 99 >> tx with DES; Lat RI 100 and OM1 100 - med Rx  . Cataract    removed both eyes   . Cholecystitis    dx during admx 10/21 >> HIDA neg // exam benign // high risk for surgery >> no surgical intervention recommended.   . Claustrophobia   . COPD (chronic obstructive pulmonary disease) ([place])   . Coronary artery disease   . Diabetes mellitus    Type II  . Diabetic peripheral neuropathy ([place])   . Diabetic retinopathy ([place])   . Discitis of lumbar region    resolved  . DVT (deep venous thrombosis) ([place]) 10/07/2015   LLE  . Endocarditis 11/28/14  . ESRD (end stage renal disease) on dialysis [facility])    s/p renal transplant in 2009, failed in 2016  . Gait disorder   . Gastroparesis   . GERD (gastroesophageal reflux disease)   . Gout   . Hearing difficulty   . Hemodialysis patient ([place])    tuesday, thursday, saturday   . HFrEF (HF with reduced EF)    Ischemic CM // Echo 10/21: EF 40-45, dist sept and apical AK, mild LVH, normal RVSF, mod MAC  . Hyperlipidemia   . Hypertension   . Incomplete bladder emptying   . Leg pain 02/05/11   with walking  . Morbid  obesity ([facility])   . Obstructive sleep apnea    not using CPAP- lost 100lbs  . Osteomyelitis ([facility])   . Posttraumatic stress disorder   . Rotator cuff disorder   . Secondary hyperparathyroidism ([facility])   . Sepsis ([facility])    Postive blood cultures -   . Sleep apnea    no cpap  . Urethral stricture   . Vitamin D deficiency     Past Surgical History:  Procedure Laterality Date  . ARTERIOVENOUS GRAFT PLACEMENT Bilateral "several"  . AV FISTULA PLACEMENT Bilateral 2015  . AV FISTULA PLACEMENT Right 06/13/2019   Procedure: INSERTION OF ARTERIOVENOUS (AV) GORE-TEX GRAFT RIGHT  ARM;  Surgeon: Fields, Charles E, MD;  Location: [facility] OR;  Service: Vascular;  Laterality: Right;  . BASCILIC VEIN TRANSPOSITION Right 09/27/2015  . BASCILIC VEIN TRANSPOSITION Right 09/27/2015   Procedure: BASILIC VEIN TRANSPOSITION right;  Surgeon:  James D Lawson, MD;  Location: [facility] OR;  Service: Vascular;  Laterality: Right;  . CATARACT EXTRACTION W/ INTRAOCULAR LENS  IMPLANT, BILATERAL Bilateral   . CORONARY STENT INTERVENTION N/A 07/27/2020   Procedure: CORONARY STENT INTERVENTION;  Surgeon: McAlhany, Christopher D, MD;  Location: [facility] INVASIVE CV LAB;  Service: Cardiovascular;  Laterality: N/A;  . CYSTOSCOPY W/ INTERNAL URETHROTOMY  09/2014  . IR THROMBECTOMY AV FISTULA W/THROMBOLYSIS/PTA INC/SHUNT/IMG RIGHT Right 08/01/2019  . IR US GUIDE VASC ACCESS RIGHT  08/01/2019  . KIDNEY TRANSPLANT  2009  . LEFT HEART CATH AND CORONARY ANGIOGRAPHY N/A 07/27/2020   Procedure: LEFT HEART CATH AND CORONARY ANGIOGRAPHY;  Surgeon: McAlhany, Christopher D, MD;  Location: [facility] INVAS[facility] CV LAB;  Service: Cardiovascular;  Laterality: N/A;  . SIGMOIDOSCOPY    . UPPER EXTREMITY VENOGRAPHY Bilateral 06/02/2019   Procedure: UPPER EXTREMITY VENOGRAPHY;  Surgeon: Fields, Charles E, MD;  Location: [facility] INVASIVE CV LAB;  Service: Cardiovascular;  Laterality: Bilateral;  . UPPER GASTROINTESTINAL ENDOSCOPY      Family History  Problem Relation Age of Onset  . Hypertension Mother   . Diabetes Mother   . Diabetes Father   . Diabetes Brother   . Colon cancer Neg Hx   . Colon polyps Neg Hx   . Esophageal cancer Neg Hx   . Rectal cancer Neg Hx   . Stomach cancer Neg Hx     Social History   Socioeconomic History  . Marital status: Divorced    Spouse name: Not on file  . Number of children: 0  . Years of education: College   . Highest education level: Bachelor's degree (e.g., BA, AB, BS)  Occupational History    Employer: UNEMPLOYED  Tobacco Use  . Smoking status: Never Smoker  . Smokeless tobacco: Never Used  Vaping Use  . Vaping Use: Never used  Substance and Sexual Activity  . Alcohol use: No    Alcohol/week: 0.0 standard drinks  . Drug use: No  . Sexual activity: Not Currently  Other Topics Concern  . Not on file  Social History Narrative    Patient is Divorced.   Patient does not have any children.   Patient is right-handed   Patient is currently in college working on his Ph.D.      Social Determinants of Health   Financial Resource Strain: Low Risk   . Difficulty of Paying Living Expenses: Not very hard  Food Insecurity: No Food Insecurity  . Worried About Running Out of Food in the Last Year: Never true  . Ran Out of Food in the Last Year: Never true    Transportation Needs: No Transportation Needs  . Lack of Transportation (Medical): No  . Lack of Transportation (Non-Medical): No  Physical Activity: Inactive  . Days of Exercise per Week: 0 days  . Minutes of Exercise per Session: 0 min  Stress: Stress Concern Present  . Feeling of Stress : Very much  Social Connections: Moderately Isolated  . Frequency of Communication with Friends and Family: More than three times a week  . Frequency of Social Gatherings with Friends and Family: More than three times a week  . Attends Religious Services: 1 to 4 times per year  . Active Member of Clubs or Organizations: No  . Attends Club or Organization Meetings: Never  . Marital Status: Divorced  Intimate Partner Violence: Not At Risk  . Fear of Current or Ex-Partner: No  . Emotionally Abused: No  . Physically Abused: No  . Sexually Abused: No    Allergies  Allergen Reactions  . Pork-Derived Products Anaphylaxis and Other (See Comments)    Fever also   . Ace Inhibitors Cough  . Hydrocodone Other (See Comments)    Hallucinations  . Pork Allergy     Other reaction(s): Unknown    Current Outpatient Medications  Medication Sig Dispense Refill  . Accu-Chek FastClix Lancets MISC USE TO CHECK BLOOD SUGARS 4 TIMES A DAY. 360 each 4  . acetaminophen (TYLENOL) 325 MG tablet Take 2 tablets (650 mg total) by mouth every 4 (four) hours as needed for headache or mild pain.    . allopurinol (ZYLOPRIM) 100 MG tablet Take 1 tablet (100 mg total) by mouth 3 (three) times a week. After  dialysis 30 tablet 6  . atorvastatin (LIPITOR) 80 MG tablet Take 1 tablet (80 mg total) by mouth daily. 90 tablet 3  . AURYXIA 1 GM 210 MG(Fe) tablet Take 210 mg by mouth 3 (three) times daily with meals.     . busPIRone (BUSPAR) 5 MG tablet     . clonazePAM (KLONOPIN) 0.5 MG tablet Take 1 tablet (0.5 mg total) by mouth 2 (two) times daily as needed for anxiety. 60 tablet 0  . clopidogrel (PLAVIX) 75 MG tablet Take 1 tablet (75 mg total) by mouth daily with breakfast. 90 tablet 3  . docusate sodium (STOOL SOFTENER) 100 MG capsule Take 1 capsule (100 mg total) by mouth 2 (two) times daily. 30 capsule 1  . glucose blood (ACCU-CHEK GUIDE) test strip USE AS INSTRUCTED TO CHECK BLOOD SUGAR 3 TIMES DAILY AS NEEDED 300 strip 12  . hydrocortisone 1 % ointment Apply 1 application topically 2 (two) times daily. 30 g 0  . hydrocortisone-pramoxine (PROCTOFOAM-HC) rectal foam Place 1 applicator rectally 2 (two) times daily. 30 g 0  . insulin glargine, 2 Unit Dial, (TOUJEO MAX SOLOSTAR) 300 UNIT/ML Solostar Pen Inject 40 units in the morning and 40 units in the evening. 48 mL 1  . Insulin Pen Needle (DROPLET PEN NEEDLES) 32G X 4 MM MISC USE&nbsp;&nbsp;TO INJECT FIVE TIMES DAILY AS DIRECTED 500 each 1  . Insulin Pen Needle 32G X 8 MM MISC Use with insulin x5 times a day (Patient taking differently: 1 each by Other route See admin instructions. Use with insulin x5 times a day) 500 each 1  . insulin regular human CONCENTRATED (HUMULIN R U-500 KWIKPEN) 500 UNIT/ML kwikpen INJECT 50 UNITS UNDER THE SKIN AT BREAKFAST, 70 UNITS AT LUNCH, AND 60 UNITS AT DINNER. 12 mL 1  . Methoxy PEG-Epoetin Beta (MIRCERA IJ) Mircera    . omeprazole (PRILOSEC)   20 MG capsule TAKE 1 CAPSULE EVERY DAY, ANNUAL APPOINTMENT DUE IN OCTOBER MUST SEE PROVIDER FOR FUTURE REFILLS. 90 capsule 0  . pantoprazole (PROTONIX) 40 MG tablet Take 1 tablet (40 mg total) by mouth daily. 30 tablet 11  . RENVELA 800 MG tablet Take 2,400 mg by mouth 3 (three)  times daily with meals.     . sodium zirconium cyclosilicate (LOKELMA) 10 g PACK packet Take 10 g by mouth daily.     . VELTASSA 8.4 g packet Take 8.4 g by mouth daily.     Marland Kitchen warfarin (COUMADIN) 5 MG tablet Take 1 tablet (5 mg total) by mouth See admin instructions. TAKE 1 TABLET EVERY DAY OR AS DIRECTED BY COUMADIN CLINIC. 100 tablet 1   No current facility-administered medications for this visit.    REVIEW OF SYSTEMS:  _0  denotes positive finding, _1  denotes negative finding Cardiac  Comments:  Chest pain or chest pressure:    Shortness of breath upon exertion:    Short of breath when lying flat:    Irregular heart rhythm:        Vascular    Pain in calf, thigh, or hip brought on by ambulation:    Pain in feet at night that wakes you up from your sleep:     Blood clot in your veins:    Leg swelling:         Pulmonary    Oxygen at home:    Productive cough:     Wheezing:         Neurologic    Sudden weakness in arms or legs:     Sudden numbness in arms or legs:     Sudden onset of difficulty speaking or slurred speech:    Temporary loss of vision in one eye:     Problems with dizziness:         Gastrointestinal    Blood in stool:     Vomited blood:         Genitourinary    Burning when urinating:     Blood in urine:        Psychiatric    Major depression:         Hematologic    Bleeding problems:    Problems with blood clotting too easily:        Skin    Rashes or ulcers:        Constitutional    Fever or chills:     PHYSICAL EXAM:   Vitals:   10/08/20 1417  BP: (!) 101/47  Pulse: (!) 101  Resp: 20  Temp: 97.7 F (36.5 C)  SpO2: 98%  Weight: (!) 304 lb (137.9 kg)  Height: _2  (1.778 m)   Morbidly obese.  Confined to wheelchair. Unlabored. 2+ radial pulse bilaterally. Thrombosed right upper extremity AV graft Right IJ tunneled dialysis catheter in place  DATA REVIEW:    Most recent CBC CBC Latest Ref Rng & Units 08/04/2020 08/03/2020  08/03/2020  WBC 4.0 - 10.5 K/uL 6.7 6.4 6.6  Hemoglobin 13.0 - 17.0 g/dL 10.3(L) 9.8(L) 10.2(L)  Hematocrit 39.0 - 52.0 % 34.2(L) 31.7(L) 33.7(L)  Platelets 150 - 400 K/uL 280 284 262     Most recent CMP CMP Latest Ref Rng & Units 08/19/2020 08/03/2020 08/03/2020  Glucose 70 - 99 mg/dL 137(H) 189(H) 189(H)  BUN 6 - 20 mg/dL - 49(H) 49(H)  Creatinine 0.61 - 1.24 mg/dL - 9.14(H) 9.16(H)  Sodium 135 - 145 mmol/L -  134(L) 135  Potassium 3.5 - 5.1 mmol/L - 4.2 4.2  Chloride 98 - 111 mmol/L - 96(L) 97(L)  CO2 22 - 32 mmol/L - 25 26  Calcium 8.9 - 10.3 mg/dL - 8.1(L) 8.2(L)  Total Protein 6.5 - 8.1 g/dL - - -  Total Bilirubin 0.3 - 1.2 mg/dL - - -  Alkaline Phos 38 - 126 U/L - - -  AST 15 - 41 U/L - - -  ALT 0 - 44 U/L - - -    Renal function CrCl cannot be calculated (Patient's most recent lab result is older than the maximum 21 days allowed.).  Hgb A1c MFr Bld (%)  Date Value  08/19/2020 7.2 (H)    LDL Cholesterol  Date Value Ref Range Status  05/13/2020 43 0 - 99 mg/dL Final     Vascular Imaging:     Yevonne Aline. Stanford Breed, MD [facility] Phone Number: [phone] 10/08/2020 2:39 PM

## 2020-10-08 NOTE — H&P (View-Only) (Signed)
ASSESSMENT & PLAN:  54 y.o. male with ESRD on HD. He has failed multiple bilateral upper extremity access. He recently underwent right upper extremity AVG to very proximal axillary vein 06/12/20. This has unfortunately thrombosed. 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 He has a RIJ TDC in place. His right central veins appear patent on recent venogram. He is not a candidate for a thigh graft because of his large pannus.   Will plan for RUE HeRO graft 11/08/19 as co-case with Dr. Donzetta Matters.  CHIEF COMPLAINT:   Needs HD access.  HISTORY:  HISTORY OF PRESENT ILLNESS: [name] [name] is a 54 y.o. male who presents to clinic for evaluation of new HD access.  He has failed multiple accesses in the past.  He has no usable upper extremity vein.  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  The right central axillary vein appeared patent.  This was followed by right upper extremity brachial axillary AV graft 06/12/2020.  This is unfortunately thrombosed.  He returns the clinic looking for new access.  He is currently dialyzing through a right IJ [facility]  Past Medical History:  Diagnosis Date  . Allergic rhinitis   . Allergy   . Anemia   . Aortic insufficiency    SBE in 2016 >> pt declined AVR // a. Echo 7/16:  Mod LVH, EF 50-55%, mild to mod AI, severe LAE, PASP 57 mmHg (LV ID end diastolic 48.8 mm);  b. Echo 2/17:  Mod LVH, EF 50-55%, mod AI, MAC, mild MR, mod LAE, mild to mod TR, PASP 63 mmHg // Echo 2/21: normal EF, mild MR, mild AI  . Atrial fibrillation and flutter ([place])    chronic anticoag with Warfarin // bradycardia noted during admit 10/21  . Bilateral carpal tunnel syndrome   . Bradycardia    brady/low BP event during admit in 10/21 >> poor candidate for pacer/EPS due to high risk of infection with ESRD; pacer not recommended by EP  . CAD (coronary artery disease)    s/p NSTEMI  10/21: cath>> pLAD 99 >> tx with DES; Lat RI 100 and OM1 100 - med Rx  . Cataract    removed both eyes   . Cholecystitis    dx during admx 10/21 >> HIDA neg // exam benign // high risk for surgery >> no surgical intervention recommended.   . Claustrophobia   . COPD (chronic obstructive pulmonary disease) ([place])   . Coronary artery disease   . Diabetes mellitus    Type II  . Diabetic peripheral neuropathy ([place])   . Diabetic retinopathy ([place])   . Discitis of lumbar region    resolved  . DVT (deep venous thrombosis) ([place]) 10/07/2015   LLE  . Endocarditis 11/28/14  . ESRD (end stage renal disease) on dialysis [facility])    s/p renal transplant in 2009, failed in 2016  . Gait disorder   . Gastroparesis   . GERD (gastroesophageal reflux disease)   . Gout   . Hearing difficulty   . Hemodialysis patient ([place])    tuesday, thursday, saturday   . HFrEF (HF with reduced EF)    Ischemic CM // Echo 10/21: EF 40-45, dist sept and apical AK, mild LVH, normal RVSF, mod MAC  . Hyperlipidemia   . Hypertension   . Incomplete bladder emptying   . Leg pain 02/05/11   with walking  . Morbid  obesity ([place])   . Obstructive sleep apnea    not using CPAP- lost 100lbs  . Osteomyelitis ([place])   . Posttraumatic stress disorder   . Rotator cuff disorder   . Secondary hyperparathyroidism ([place])   . Sepsis ([place])    Postive blood cultures -   . Sleep apnea    no cpap  . Urethral stricture   . Vitamin D deficiency     Past Surgical History:  Procedure Laterality Date  . ARTERIOVENOUS GRAFT PLACEMENT Bilateral "several"  . AV FISTULA PLACEMENT Bilateral 2015  . AV FISTULA PLACEMENT Right 06/13/2019   Procedure: INSERTION OF ARTERIOVENOUS (AV) GORE-TEX GRAFT RIGHT  ARM;  Surgeon: Elam Dutch, MD;  Location: [place];  Service: Vascular;  Laterality: Right;  . BASCILIC VEIN TRANSPOSITION Right 09/27/2015  . [place] Right 09/27/2015   Procedure: BASILIC VEIN TRANSPOSITION right;  Surgeon:  Mal Misty, MD;  Location: [place];  Service: Vascular;  Laterality: Right;  . CATARACT EXTRACTION W/ INTRAOCULAR LENS  IMPLANT, BILATERAL Bilateral   . CORONARY STENT INTERVENTION N/A 07/27/2020   Procedure: CORONARY STENT INTERVENTION;  Surgeon: Burnell Blanks, MD;  Location: [place] CV LAB;  Service: Cardiovascular;  Laterality: N/A;  . CYSTOSCOPY W/ INTERNAL URETHROTOMY  09/2014  . IR THROMBECTOMY AV FISTULA W/THROMBOLYSIS/PTA INC/SHUNT/IMG RIGHT Right 08/01/2019  . IR US GUIDE VASC ACCESS RIGHT  08/01/2019  . KIDNEY TRANSPLANT  2009  . LEFT HEART CATH AND CORONARY ANGIOGRAPHY N/A 07/27/2020   Procedure: LEFT HEART CATH AND CORONARY ANGIOGRAPHY;  Surgeon: Burnell Blanks, MD;  Location: [place] CV [facility];  Service: Cardiovascular;  Laterality: N/A;  . SIGMOIDOSCOPY    . UPPER EXTREMITY VENOGRAPHY Bilateral 06/02/2019   Procedure: UPPER EXTREMITY VENOGRAPHY;  Surgeon: Elam Dutch, MD;  Location: [place] CV LAB;  Service: Cardiovascular;  Laterality: Bilateral;  . UPPER GASTROINTESTINAL ENDOSCOPY      Family History  Problem Relation Age of Onset  . Hypertension Mother   . Diabetes Mother   . Diabetes Father   . Diabetes Brother   . Colon cancer Neg Hx   . Colon polyps Neg Hx   . Esophageal cancer Neg Hx   . Rectal cancer Neg Hx   . Stomach cancer Neg Hx     Social History   Socioeconomic History  . Marital status: Divorced    Spouse name: Not on file  . Number of children: 0  . Years of education: College   . Highest education level: Bachelor's degree (e.g., BA, AB, BS)  Occupational History    Employer: UNEMPLOYED  Tobacco Use  . Smoking status: Never Smoker  . Smokeless tobacco: Never Used  Vaping Use  . Vaping Use: Never used  Substance and Sexual Activity  . Alcohol use: No    Alcohol/week: 0.0 standard drinks  . Drug use: No  . Sexual activity: Not Currently  Other Topics Concern  . Not on file  Social History Narrative    Patient is Divorced.   Patient does not have any children.   Patient is right-handed   Patient is currently in college working on his Ph.D.      Social Determinants of Health   Financial Resource Strain: Low Risk   . Difficulty of Paying Living Expenses: Not very hard  Food Insecurity: No Food Insecurity  . Worried About Charity fundraiser in the Last Year: Never true  . Ran Out of Food in the Last Year: Never true  Transportation Needs: No Transportation Needs  . Lack of Transportation (Medical): No  . Lack of Transportation (Non-Medical): No  Physical Activity: Inactive  . Days of Exercise per Week: 0 days  . Minutes of Exercise per Session: 0 min  Stress: Stress Concern Present  . Feeling of Stress : Very much  Social Connections: Moderately Isolated  . Frequency of Communication with Friends and Family: More than three times a week  . Frequency of Social Gatherings with Friends and Family: More than three times a week  . Attends Religious Services: 1 to 4 times per year  . Active Member of Clubs or Organizations: No  . Attends Archivist Meetings: Never  . Marital Status: Divorced  Human resources officer Violence: Not At Risk  . Fear of Current or Ex-Partner: No  . Emotionally Abused: No  . Physically Abused: No  . Sexually Abused: No    Allergies  Allergen Reactions  . Pork-Derived Products Anaphylaxis and Other (See Comments)    Fever also   . Ace Inhibitors Cough  . Hydrocodone Other (See Comments)    Hallucinations  . Pork Allergy     Other reaction(s): Unknown    Current Outpatient Medications  Medication Sig Dispense Refill  . Accu-Chek FastClix Lancets MISC USE TO CHECK BLOOD SUGARS 4 TIMES A DAY. 360 each 4  . acetaminophen (TYLENOL) 325 MG tablet Take 2 tablets (650 mg total) by mouth every 4 (four) hours as needed for headache or mild pain.    Marland Kitchen allopurinol (ZYLOPRIM) 100 MG tablet Take 1 tablet (100 mg total) by mouth 3 (three) times a week. After  dialysis 30 tablet 6  . atorvastatin (LIPITOR) 80 MG tablet Take 1 tablet (80 mg total) by mouth daily. 90 tablet 3  . AURYXIA 1 GM 210 MG(Fe) tablet Take 210 mg by mouth 3 (three) times daily with meals.     . busPIRone (BUSPAR) 5 MG tablet     . clonazePAM (KLONOPIN) 0.5 MG tablet Take 1 tablet (0.5 mg total) by mouth 2 (two) times daily as needed for anxiety. 60 tablet 0  . clopidogrel (PLAVIX) 75 MG tablet Take 1 tablet (75 mg total) by mouth daily with breakfast. 90 tablet 3  . docusate sodium (STOOL SOFTENER) 100 MG capsule Take 1 capsule (100 mg total) by mouth 2 (two) times daily. 30 capsule 1  . glucose blood (ACCU-CHEK GUIDE) test strip USE AS INSTRUCTED TO CHECK BLOOD SUGAR 3 TIMES DAILY AS NEEDED 300 strip 12  . hydrocortisone 1 % ointment Apply 1 application topically 2 (two) times daily. 30 g 0  . hydrocortisone-pramoxine (PROCTOFOAM-HC) rectal foam Place 1 applicator rectally 2 (two) times daily. 30 g 0  . insulin glargine, 2 Unit Dial, (TOUJEO MAX SOLOSTAR) 300 UNIT/ML Solostar Pen Inject 40 units in the morning and 40 units in the evening. 48 mL 1  . Insulin Pen Needle (DROPLET PEN NEEDLES) 32G X 4 MM MISC USE&nbsp;&nbsp;TO INJECT FIVE TIMES DAILY AS DIRECTED 500 each 1  . Insulin Pen Needle 32G X 8 MM MISC Use with insulin x5 times a day (Patient taking differently: 1 each by Other route See admin instructions. Use with insulin x5 times a day) 500 each 1  . insulin regular human CONCENTRATED (HUMULIN R U-500 KWIKPEN) 500 UNIT/ML kwikpen INJECT 50 UNITS UNDER THE SKIN AT BREAKFAST, 70 UNITS AT LUNCH, AND 60 UNITS AT DINNER. 12 mL 1  . Methoxy PEG-Epoetin Beta (MIRCERA IJ) Mircera    . omeprazole (PRILOSEC)  20 MG capsule TAKE 1 CAPSULE EVERY DAY, ANNUAL APPOINTMENT DUE IN OCTOBER MUST SEE PROVIDER FOR FUTURE REFILLS. 90 capsule 0  . pantoprazole (PROTONIX) 40 MG tablet Take 1 tablet (40 mg total) by mouth daily. 30 tablet 11  . RENVELA 800 MG tablet Take 2,400 mg by mouth 3 (three)  times daily with meals.     . sodium zirconium cyclosilicate (LOKELMA) 10 g PACK packet Take 10 g by mouth daily.     . VELTASSA 8.4 g packet Take 8.4 g by mouth daily.     Marland Kitchen warfarin (COUMADIN) 5 MG tablet Take 1 tablet (5 mg total) by mouth See admin instructions. TAKE 1 TABLET EVERY DAY OR AS DIRECTED BY COUMADIN CLINIC. 100 tablet 1   No current facility-administered medications for this visit.    REVIEW OF SYSTEMS:  _0  denotes positive finding, _1  denotes negative finding Cardiac  Comments:  Chest pain or chest pressure:    Shortness of breath upon exertion:    Short of breath when lying flat:    Irregular heart rhythm:        Vascular    Pain in calf, thigh, or hip brought on by ambulation:    Pain in feet at night that wakes you up from your sleep:     Blood clot in your veins:    Leg swelling:         Pulmonary    Oxygen at home:    Productive cough:     Wheezing:         Neurologic    Sudden weakness in arms or legs:     Sudden numbness in arms or legs:     Sudden onset of difficulty speaking or slurred speech:    Temporary loss of vision in one eye:     Problems with dizziness:         Gastrointestinal    Blood in stool:     Vomited blood:         Genitourinary    Burning when urinating:     Blood in urine:        Psychiatric    Major depression:         Hematologic    Bleeding problems:    Problems with blood clotting too easily:        Skin    Rashes or ulcers:        Constitutional    Fever or chills:     PHYSICAL EXAM:   Vitals:   10/08/20 1417  BP: (!) 101/47  Pulse: (!) 101  Resp: 20  Temp: 97.7 F (36.5 C)  SpO2: 98%  Weight: (!) 304 lb (137.9 kg)  Height: _2  (1.778 m)   Morbidly obese.  Confined to wheelchair. Unlabored. 2+ radial pulse bilaterally. Thrombosed right upper extremity AV graft Right IJ tunneled dialysis catheter in place  DATA REVIEW:    Most recent CBC CBC Latest Ref Rng & Units 08/04/2020 08/03/2020  08/03/2020  WBC 4.0 - 10.5 K/uL 6.7 6.4 6.6  Hemoglobin 13.0 - 17.0 g/dL 10.3(L) 9.8(L) 10.2(L)  Hematocrit 39.0 - 52.0 % 34.2(L) 31.7(L) 33.7(L)  Platelets 150 - 400 K/uL 280 284 262     Most recent CMP CMP Latest Ref Rng & Units 08/19/2020 08/03/2020 08/03/2020  Glucose 70 - 99 mg/dL 137(H) 189(H) 189(H)  BUN 6 - 20 mg/dL - 49(H) 49(H)  Creatinine 0.61 - 1.24 mg/dL - 9.14(H) 9.16(H)  Sodium 135 - 145 mmol/L -  134(L) 135  Potassium 3.5 - 5.1 mmol/L - 4.2 4.2  Chloride 98 - 111 mmol/L - 96(L) 97(L)  CO2 22 - 32 mmol/L - 25 26  Calcium 8.9 - 10.3 mg/dL - 8.1(L) 8.2(L)  Total Protein 6.5 - 8.1 g/dL - - -  Total Bilirubin 0.3 - 1.2 mg/dL - - -  Alkaline Phos 38 - 126 U/L - - -  AST 15 - 41 U/L - - -  ALT 0 - 44 U/L - - -    Renal function CrCl cannot be calculated (Patient's most recent lab result is older than the maximum 21 days allowed.).  Hgb A1c MFr Bld (%)  Date Value  08/19/2020 7.2 (H)    LDL Cholesterol  Date Value Ref Range Status  05/13/2020 43 0 - 99 mg/dL Final     Vascular Imaging:     Yevonne Aline. Stanford Breed, MD [facility] Phone Number: [phone] 10/08/2020 2:39 PM

## 2020-10-09 DIAGNOSIS — R2681 Unsteadiness on feet: Secondary | ICD-10-CM | POA: Diagnosis not present

## 2020-10-09 DIAGNOSIS — E1142 Type 2 diabetes mellitus with diabetic polyneuropathy: Secondary | ICD-10-CM | POA: Diagnosis not present

## 2020-10-09 DIAGNOSIS — M4627 Osteomyelitis of vertebra, lumbosacral region: Secondary | ICD-10-CM | POA: Diagnosis not present

## 2020-10-09 DIAGNOSIS — I38 Endocarditis, valve unspecified: Secondary | ICD-10-CM | POA: Diagnosis not present

## 2020-10-09 DIAGNOSIS — I358 Other nonrheumatic aortic valve disorders: Secondary | ICD-10-CM | POA: Diagnosis not present

## 2020-10-10 DIAGNOSIS — N2581 Secondary hyperparathyroidism of renal origin: Secondary | ICD-10-CM | POA: Diagnosis not present

## 2020-10-10 DIAGNOSIS — N186 End stage renal disease: Secondary | ICD-10-CM | POA: Diagnosis not present

## 2020-10-10 DIAGNOSIS — Z992 Dependence on renal dialysis: Secondary | ICD-10-CM | POA: Diagnosis not present

## 2020-10-10 DIAGNOSIS — E8770 Fluid overload, unspecified: Secondary | ICD-10-CM | POA: Diagnosis not present

## 2020-10-11 DIAGNOSIS — E1122 Type 2 diabetes mellitus with diabetic chronic kidney disease: Secondary | ICD-10-CM | POA: Diagnosis not present

## 2020-10-11 DIAGNOSIS — Z992 Dependence on renal dialysis: Secondary | ICD-10-CM | POA: Diagnosis not present

## 2020-10-11 DIAGNOSIS — N186 End stage renal disease: Secondary | ICD-10-CM | POA: Diagnosis not present

## 2020-10-13 DIAGNOSIS — N186 End stage renal disease: Secondary | ICD-10-CM | POA: Diagnosis not present

## 2020-10-13 DIAGNOSIS — N2581 Secondary hyperparathyroidism of renal origin: Secondary | ICD-10-CM | POA: Diagnosis not present

## 2020-10-13 DIAGNOSIS — Z992 Dependence on renal dialysis: Secondary | ICD-10-CM | POA: Diagnosis not present

## 2020-10-14 ENCOUNTER — Ambulatory Visit: Payer: Medicare HMO | Admitting: Podiatry

## 2020-10-15 DIAGNOSIS — N2581 Secondary hyperparathyroidism of renal origin: Secondary | ICD-10-CM | POA: Diagnosis not present

## 2020-10-15 DIAGNOSIS — N186 End stage renal disease: Secondary | ICD-10-CM | POA: Diagnosis not present

## 2020-10-15 DIAGNOSIS — Z992 Dependence on renal dialysis: Secondary | ICD-10-CM | POA: Diagnosis not present

## 2020-10-17 ENCOUNTER — Other Ambulatory Visit: Payer: Self-pay

## 2020-10-17 DIAGNOSIS — N186 End stage renal disease: Secondary | ICD-10-CM | POA: Diagnosis not present

## 2020-10-17 DIAGNOSIS — Z992 Dependence on renal dialysis: Secondary | ICD-10-CM | POA: Diagnosis not present

## 2020-10-17 DIAGNOSIS — N2581 Secondary hyperparathyroidism of renal origin: Secondary | ICD-10-CM | POA: Diagnosis not present

## 2020-10-17 NOTE — Progress Notes (Signed)
Received faxed request from [facility] for refill of Clonazepam.  Per phone noted on 08/27/20, pt need OV before prescription refilled.

## 2020-10-19 DIAGNOSIS — Z992 Dependence on renal dialysis: Secondary | ICD-10-CM | POA: Diagnosis not present

## 2020-10-19 DIAGNOSIS — N186 End stage renal disease: Secondary | ICD-10-CM | POA: Diagnosis not present

## 2020-10-19 DIAGNOSIS — N2581 Secondary hyperparathyroidism of renal origin: Secondary | ICD-10-CM | POA: Diagnosis not present

## 2020-10-21 ENCOUNTER — Other Ambulatory Visit: Payer: Medicare HMO

## 2020-10-22 ENCOUNTER — Other Ambulatory Visit: Payer: Self-pay | Admitting: Endocrinology

## 2020-10-22 DIAGNOSIS — N2581 Secondary hyperparathyroidism of renal origin: Secondary | ICD-10-CM | POA: Diagnosis not present

## 2020-10-22 DIAGNOSIS — Z992 Dependence on renal dialysis: Secondary | ICD-10-CM | POA: Diagnosis not present

## 2020-10-22 DIAGNOSIS — N186 End stage renal disease: Secondary | ICD-10-CM | POA: Diagnosis not present

## 2020-10-23 ENCOUNTER — Other Ambulatory Visit: Payer: Self-pay | Admitting: Internal Medicine

## 2020-10-23 ENCOUNTER — Telehealth: Payer: Self-pay

## 2020-10-23 NOTE — Telephone Encounter (Signed)
LVM instructing pt to call back. If he wants Clonazepam refilled, he will need to schedule OV with PCP.  This is per notes on 08/27/20.  Refill request received from [facility] on 10/16/20

## 2020-10-23 NOTE — Telephone Encounter (Signed)
[place] Controlled Database Checked Last filled:  07/23/20   # 60 LOV w/you:   03/22/20  Next appt w/you:  none

## 2020-10-24 DIAGNOSIS — N186 End stage renal disease: Secondary | ICD-10-CM | POA: Diagnosis not present

## 2020-10-24 DIAGNOSIS — Z992 Dependence on renal dialysis: Secondary | ICD-10-CM | POA: Diagnosis not present

## 2020-10-24 DIAGNOSIS — N2581 Secondary hyperparathyroidism of renal origin: Secondary | ICD-10-CM | POA: Diagnosis not present

## 2020-10-24 NOTE — Telephone Encounter (Signed)
Patient called back yesterday and spoke to [facility].  I called the patient back this morning and scheduled an appointment.

## 2020-10-25 ENCOUNTER — Ambulatory Visit: Payer: Medicare HMO | Admitting: Endocrinology

## 2020-10-26 DIAGNOSIS — N186 End stage renal disease: Secondary | ICD-10-CM | POA: Diagnosis not present

## 2020-10-26 DIAGNOSIS — N2581 Secondary hyperparathyroidism of renal origin: Secondary | ICD-10-CM | POA: Diagnosis not present

## 2020-10-26 DIAGNOSIS — Z992 Dependence on renal dialysis: Secondary | ICD-10-CM | POA: Diagnosis not present

## 2020-10-28 ENCOUNTER — Telehealth (INDEPENDENT_AMBULATORY_CARE_PROVIDER_SITE_OTHER): Payer: Medicare HMO | Admitting: Internal Medicine

## 2020-10-28 ENCOUNTER — Encounter: Payer: Self-pay | Admitting: Internal Medicine

## 2020-10-28 DIAGNOSIS — Z992 Dependence on renal dialysis: Secondary | ICD-10-CM | POA: Diagnosis not present

## 2020-10-28 DIAGNOSIS — K649 Unspecified hemorrhoids: Secondary | ICD-10-CM

## 2020-10-28 DIAGNOSIS — F419 Anxiety disorder, unspecified: Secondary | ICD-10-CM

## 2020-10-28 DIAGNOSIS — N186 End stage renal disease: Secondary | ICD-10-CM

## 2020-10-28 MED ORDER — HYDROCORTISONE ACETATE 25 MG RE SUPP: 25.0000 mg | Freq: Two times a day (BID) | RECTAL | 0 refills | Status: DC

## 2020-10-28 NOTE — Assessment & Plan Note (Signed)
Will fill clonazepam temporarily until next in person office visit to clarify and discuss more.

## 2020-10-28 NOTE — Assessment & Plan Note (Signed)
Currently not candidate for transplant and is undergoing access placement soon as he has few options for access points.

## 2020-10-28 NOTE — Progress Notes (Signed)
Virtual Visit via Audio Note  I connected with [name] [name] on 10/28/20 at 10:40 AM EST by an audio-only enabled telemedicine application and verified that I am speaking with the correct person using two identifiers.  The patient and the provider were at separate locations throughout the entire encounter. Patient location: home, Provider location: work   I discussed the limitations of evaluation and management by telemedicine and the availability of in person appointments. The patient expressed understanding and agreed to proceed. The patient and the provider were the only parties present for the visit unless noted in HPI below.  History of Present Illness: The patient is a 55 y.o. man with visit for follow up. He has been in the hospital recently and is taking antibiotics for some infection. He thinks this was helping his stomach. Upon review of records he is talking about a hospital stay from October 2021. He was discharged with 1 week augmentin for possible cholecystitis. He then is able to clarify with some questions that he is having problems with hemorrhoids and needs suppositories for those. Is also needing refill on clonazepam. His nephrologist always filled this for him as his big anxiety is dialysis not being able to move around but she has retired and no one at that group wants to prescribe such a medication. He has been getting it from other providers at this office while his PCP was on medical leave. Has not had in several months now and quality of life is not as good. He is having procedure to try a new dialysis access soon.    Observations/Objective: A and O times 3, voice strong, no coughing or dyspnea during visit  Assessment and Plan: See problem oriented charting  Follow Up Instructions: will refill clonazepam, rx hydrocortisone suppositories  Visit time 12 minutes in non-face to face communication with patient and coordination of care.  I discussed the assessment and treatment  plan with the patient. The patient was provided an opportunity to ask questions and all were answered. The patient agreed with the plan and demonstrated an understanding of the instructions.   The patient was advised to call back or seek an in-person evaluation if the symptoms worsen or if the condition fails to improve as anticipated.  Hoyt Koch, MD

## 2020-10-28 NOTE — Assessment & Plan Note (Signed)
Does well with suppositories and will rx today.

## 2020-10-29 ENCOUNTER — Other Ambulatory Visit: Payer: Self-pay | Admitting: Internal Medicine

## 2020-10-29 DIAGNOSIS — N2581 Secondary hyperparathyroidism of renal origin: Secondary | ICD-10-CM | POA: Diagnosis not present

## 2020-10-29 DIAGNOSIS — Z992 Dependence on renal dialysis: Secondary | ICD-10-CM | POA: Diagnosis not present

## 2020-10-29 DIAGNOSIS — N186 End stage renal disease: Secondary | ICD-10-CM | POA: Diagnosis not present

## 2020-10-29 MED ORDER — CLONAZEPAM 0.5 MG PO TABS: ORAL_TABLET | ORAL | 3 refills | Status: DC

## 2020-10-30 ENCOUNTER — Other Ambulatory Visit: Payer: Self-pay

## 2020-10-31 DIAGNOSIS — Z992 Dependence on renal dialysis: Secondary | ICD-10-CM | POA: Diagnosis not present

## 2020-10-31 DIAGNOSIS — N186 End stage renal disease: Secondary | ICD-10-CM | POA: Diagnosis not present

## 2020-10-31 DIAGNOSIS — N2581 Secondary hyperparathyroidism of renal origin: Secondary | ICD-10-CM | POA: Diagnosis not present

## 2020-11-02 DIAGNOSIS — Z992 Dependence on renal dialysis: Secondary | ICD-10-CM | POA: Diagnosis not present

## 2020-11-02 DIAGNOSIS — N2581 Secondary hyperparathyroidism of renal origin: Secondary | ICD-10-CM | POA: Diagnosis not present

## 2020-11-02 DIAGNOSIS — N186 End stage renal disease: Secondary | ICD-10-CM | POA: Diagnosis not present

## 2020-11-04 ENCOUNTER — Ambulatory Visit (INDEPENDENT_AMBULATORY_CARE_PROVIDER_SITE_OTHER): Payer: Medicare HMO | Admitting: Podiatry

## 2020-11-04 ENCOUNTER — Other Ambulatory Visit: Payer: Self-pay

## 2020-11-04 ENCOUNTER — Other Ambulatory Visit (HOSPITAL_COMMUNITY)
Admission: RE | Admit: 2020-11-04 | Discharge: 2020-11-04 | Disposition: A | Discharge status: Home / Self Care | Payer: Medicare HMO | Source: Ambulatory Visit | Attending: Vascular Surgery | Admitting: Vascular Surgery

## 2020-11-04 DIAGNOSIS — Z01812 Encounter for preprocedural laboratory examination: Secondary | ICD-10-CM | POA: Diagnosis not present

## 2020-11-04 DIAGNOSIS — L84 Corns and callosities: Secondary | ICD-10-CM | POA: Diagnosis not present

## 2020-11-04 DIAGNOSIS — E1149 Type 2 diabetes mellitus with other diabetic neurological complication: Secondary | ICD-10-CM | POA: Diagnosis not present

## 2020-11-04 DIAGNOSIS — Z7901 Long term (current) use of anticoagulants: Secondary | ICD-10-CM

## 2020-11-04 DIAGNOSIS — Z20822 Contact with and (suspected) exposure to covid-19: Secondary | ICD-10-CM | POA: Insufficient documentation

## 2020-11-04 LAB — SARS CORONAVIRUS 2 (TAT 6-24 HRS): SARS Coronavirus 2: NEGATIVE

## 2020-11-05 DIAGNOSIS — N186 End stage renal disease: Secondary | ICD-10-CM | POA: Diagnosis not present

## 2020-11-05 DIAGNOSIS — Z992 Dependence on renal dialysis: Secondary | ICD-10-CM | POA: Diagnosis not present

## 2020-11-05 DIAGNOSIS — N2581 Secondary hyperparathyroidism of renal origin: Secondary | ICD-10-CM | POA: Diagnosis not present

## 2020-11-05 NOTE — Progress Notes (Signed)
Anesthesia Chart Review: Same day workup  Follows with cardiology for history of aortic valve insufficiency, HTN, HLD, morbid obesity, CAD s/p NSTEMI 07/27/20 treated with DES to LAD, prior endocarditis, atrial fibrillation and DVT.  Last seen by Dr. Angelena Form 09/26/2020.  Per note, "CAD without angina: No chest following his PCI. Continue Plavix and statin. He is now on atorvastatin 80 mg daily. Will remove Crestor from his list. No ASA since he is also on Coumadin. No beta blocker with hypotension on dialysis."  He was noted to be in sinus rhythm at that time.  No changes were made to medication management.   He is s/p failed kidney transpant in 2009 at [facility] in [place].  He has had multiple failed bilateral upper extremity access procedures. He recently underwent right upper extremity AVG to very proximal axillary vein 06/12/20. This has unfortunately thrombosed. 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 He has a RIJ TDC in place.   IDDMII, last A1c 7.2 on 08/19/20.  Will need DOS labs and eval.   EKG 08/04/2020: Atrial flutter.  Rate 83.  LAD. Non-specific intra-ventricular conduction block. Inferior infarct , age undetermined. Anterolateral infarct , age undetermined. No significant change since last tracing  TTE 07/29/20: 1. Extremely limited; probable distal septal and apical akinesis with  overall mild to moderate LV dysfunction.  2. Left ventricular ejection fraction, by estimation, is 40 to 45%. The  left ventricle has mildly decreased function. The left ventricle  demonstrates regional wall motion abnormalities (see scoring  diagram/findings for description). There is mild left  ventricular hypertrophy. Left ventricular diastolic function could not be  evaluated.  3. Right ventricular systolic function is normal. The right ventricular  size is normal.  4. The mitral valve is normal in structure. No evidence of mitral  valve  regurgitation. No evidence of mitral stenosis. Moderate mitral annular  calcification.  5. The aortic valve is tricuspid. Aortic valve regurgitation is not  visualized. No aortic stenosis is present.  6. The inferior vena cava is dilated in size with <50% respiratory  variability, suggesting right atrial pressure of 15 mmHg.  Cath and PCI 07/27/20: 1. Severe proximal to mid LAD stenosis 2. Chronic occlusion of the distal intermediate branch, small in caliber 3. Complete occlusion of the distal segment of the first obtuse marginal branch. Cannot exclude that this was the infarct vessel but the patient was pain free and this vessel did not appear large enough for PCI. There is collateral filling of lateral wall branches from the RCA 4. Successful PTCA/DES x 1 proximal to mid LAD  Recommendations: Will admit to the ICU. Will continue DAPT with ASA and Plavix. Resume coumadin prior to discharge. I would plan to use triple therapy with ASA, Plavix and coumadin for one month then stop ASA. Continue statin. Will not start a beta blocker given his soft BP. Echo tomorrow.     Wynonia Musty [facility] Short Stay Center/Anesthesiology Phone [phone] 11/05/2020 12:02 PM

## 2020-11-05 NOTE — Anesthesia Preprocedure Evaluation (Addendum)
Anesthesia Evaluation  Patient identified by MRN, date of birth, ID band Patient awake    Reviewed: Allergy & Precautions, NPO status , Patient's Chart, lab work & pertinent test results  History of Anesthesia Complications Negative for: history of anesthetic complications  Airway Mallampati: II  TM Distance: >3 FB Neck ROM: Full    Dental  (+) Caps, Dental Advisory Given, Missing   Pulmonary sleep apnea (does not use CPAP) , COPD,  11/04/2020 SARS Coronavirus NEG   breath sounds clear to auscultation       Cardiovascular hypertension, Pt. on medications (-) angina+ CAD, + Past MI, + Cardiac Stents and + DVT  + dysrhythmias Atrial Fibrillation + Valvular Problems/Murmurs (h/o endocarditis 2016)  Rhythm:Irregular Rate:Normal  07/2020 ECHO: EF 40-45%, septal and apical akinesis, mild LVH, no significant valvular abnormalities   Neuro/Psych Anxiety    GI/Hepatic Neg liver ROS, GERD  Medicated and Controlled,  Endo/Other  diabetes (glu 147), Insulin DependentMorbid obesity  Renal/GU ESRF and DialysisRenal disease (K+ 5.3)     Musculoskeletal  (+) Arthritis ,   Abdominal (+) + obese,   Peds  Hematology coumadin   Anesthesia Other Findings   Reproductive/Obstetrics                           Anesthesia Physical Anesthesia Plan  ASA: IV  Anesthesia Plan: Regional and MAC   Post-op Pain Management:    Induction:   PONV Risk Score and Plan: 1 and Ondansetron  Airway Management Planned: Natural Airway and Simple Face Mask  Additional Equipment: None  Intra-op Plan:   Post-operative Plan:   Informed Consent: I have reviewed the patients History and Physical, chart, labs and discussed the procedure including the risks, benefits and alternatives for the proposed anesthesia with the patient or authorized representative who has indicated his/her understanding and acceptance.     Dental  advisory given  Plan Discussed with: CRNA and Surgeon  Anesthesia Plan Comments: (Plan routine monitors, MAC with supraclav block  PAT note by Karoline Caldwell, PA-C: Follows with cardiology for history of aortic valve insufficiency, HTN, HLD, morbid obesity, CAD s/p NSTEMI 07/27/20 treated with DES to LAD, prior endocarditis, atrial fibrillation and DVT.  Last seen by Dr. Angelena Form 09/26/2020.  Per note, "CAD without angina: No chest following his PCI. Continue Plavix and statin. He is now on atorvastatin 80 mg daily. Will remove Crestor from his list. No ASA since he is also on Coumadin. No beta blocker with hypotension on dialysis."  He was noted to be in sinus rhythm at that time.  No changes were made to medication management.   He is s/p failed kidney transpant in 2009 at [facility] in [place].  He has had multiple failed bilateral upper extremity access procedures. He recently underwent right upper extremity AVG to very proximal axillary vein 06/12/20. This has unfortunately thrombosed. 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 He has a RIJ TDC in place.   IDDMII, last A1c 7.2 on 08/19/20.  Will need DOS labs and eval.   EKG 08/04/2020: Atrial flutter.  Rate 83.  LAD. Non-specific intra-ventricular conduction block. Inferior infarct , age undetermined. Anterolateral infarct , age undetermined. No significant change since last tracing  TTE 07/29/20: 1. Extremely limited; probable distal septal and apical akinesis with  overall mild to moderate LV dysfunction.  2. Left ventricular ejection fraction, by estimation, is 40 to 45%. The  left ventricle has mildly decreased function. The left ventricle  demonstrates regional wall motion abnormalities (see scoring  diagram/findings for description). There is mild left  ventricular hypertrophy. Left ventricular diastolic function could not be  evaluated.  3. Right ventricular systolic  function is normal. The right ventricular  size is normal.  4. The mitral valve is normal in structure. No evidence of mitral valve  regurgitation. No evidence of mitral stenosis. Moderate mitral annular  calcification.  5. The aortic valve is tricuspid. Aortic valve regurgitation is not  visualized. No aortic stenosis is present.  6. The inferior vena cava is dilated in size with <50% respiratory  variability, suggesting right atrial pressure of 15 mmHg.  Cath and PCI 07/27/20: 1. Severe proximal to mid LAD stenosis 2. Chronic occlusion of the distal intermediate branch, small in caliber 3. Complete occlusion of the distal segment of the first obtuse marginal branch. Cannot exclude that this was the infarct vessel but the patient was pain free and this vessel did not appear large enough for PCI. There is collateral filling of lateral wall branches from the RCA 4. Successful PTCA/DES x 1 proximal to mid LAD  Recommendations: Will admit to the ICU. Will continue DAPT with ASA and Plavix. Resume coumadin prior to discharge. I would plan to use triple therapy with ASA, Plavix and coumadin for one month then stop ASA. Continue statin. Will not start a beta blocker given his soft BP. Echo tomorrow.   )      Anesthesia Quick Evaluation

## 2020-11-06 ENCOUNTER — Encounter (HOSPITAL_COMMUNITY): Payer: Self-pay

## 2020-11-06 ENCOUNTER — Other Ambulatory Visit: Payer: Self-pay

## 2020-11-06 MED ORDER — DEXTROSE 5 % IV SOLN
3.0000 g | INTRAVENOUS | Status: DC
  Filled 2020-11-06: qty 3000

## 2020-11-06 NOTE — Progress Notes (Signed)
Call received from [name] Ard, RN with Dr. Mora Appl office. She confirmed [facility] has been put in place for patient at discharge back to his residence. Patient verbalized understanding of need to have a responsible adult ride with him on the way home and will stay with him for at least 24 hours after surgery. Jovita Kussmaul, RN made aware.

## 2020-11-06 NOTE — Progress Notes (Signed)
Subjective: 55 year old male presents the office today for foot evaluation as well as for calluses to his big toes.  Denies any open sores at this time denies any swelling or redness or any drainage.  He has no other concerns today. Denies any systemic complaints such as fevers, chills, nausea, vomiting. No acute changes since last appointment, and no other complaints at this time.   Objective: AAO x3, NAD DP/PT pulses palpable bilaterally, CRT less than 3 seconds Hyperkeratotic tissue plantar hallux without any underlying ulceration drainage or any signs of infection.  There is no open lesions identified today.  There is no blister formation.  The issues that he was seen for previously have resolved. No pain with calf compression, swelling, warmth, erythema  Assessment: Preulcerative calluses bilaterally  Plan: -All treatment options discussed with the patient including all alternatives, risks, complications.  -Sharp debrided the hyperkeratotic lesions without any complications or bleeding.  Recommend moisturizing offloading daily peer discussed daily foot inspection as well as glucose control. -Patient encouraged to call the office with any questions, concerns, change in symptoms.   Trula Slade DPM

## 2020-11-06 NOTE — Progress Notes (Signed)
During PAT same day work-up phone call, pt stated he was told arrangements were made for him to go home after sx via [facility] and was awaiting a call back to confirm. Pre-op staff was not aware of ths arrangement; called Herma Ard, RN with Dr. Mora Appl office and left VM for clarification as patient will need to have someone with him during transport (other than transport staff).

## 2020-11-06 NOTE — Progress Notes (Signed)
Same Day Work-Up Phone Call:  PCP - Benjamine Mola A. Sharlet Salina, MD Cardiologist - Lauree Chandler, MD  PPM/ICD - Denies  Chest x-ray - 07/30/20 EKG - 08/04/20 ECHO - 07/29/20 Cardiac Cath - 07/27/20  Sleep Study - 05/13/14 at [facility], positive for OSA CPAP - No  Fasting Blood Sugar - 75-150s Checks Blood Sugar 3 times a day. Last A1C 7.2 08/19/20  Blood Thinner Instructions: Per patient, last dose Plavix 01/21; last dose Coumadin 01/23. Aspirin Instructions: N/A  ERAS Protcol - N/a PRE-SURGERY Ensure or G2- N/A  COVID TEST- 11/04/20, Negative   Anesthesia review: Yes, hx. CAD  -----------------------------------------------------------------------  Patient Instructions:   Your procedure is scheduled on Thursday, 11/07/20 (07:30 AM- 10:30 AM).  Report to [facility] Main Entrance "A" at 05:30 A.M., and check in at the Admitting office.  Call this number if you have problems the morning of surgery:  [phone]    Remember:  Do not eat or drink after midnight the night before your surgery.     Take these medicines the morning of surgery with A SIP OF WATER: atorvastatin (LIPITOR) midodrine (PROAMATINE)  omeprazole (PRILOSEC) IF NEEDED: acetaminophen (TYLENOL), busPIRone (BUSPAR)  Follow your surgeon's instructions on when to stop clopidogrel (PLAVIX) and warfarin (COUMADIN).  If no instructions were given by your surgeon then you will need to call the office to get those instructions.     . THE NIGHT BEFORE SURGERY, take 40 units of insulin glargine (TOUJEO MAX SOLOSTAR) insulin.     . THE MORNING OF SURGERY, take 40 units of insulin glargine (TOUJEO MAX SOLOSTAR) insulin. If your CBG is greater than 220 mg/dL, you may take  of your sliding scale (correction) dose ( 25 units) HUMULIN R insulin.  How do I manage my blood sugar before surgery?  . Check your blood sugar the morning of your surgery when you wake up and every 2  hours until you get to the Short Stay unit. o If your blood sugar is less than 70 mg/dL, you will need to treat for low blood sugar: - Do not take insulin. - Treat a low blood sugar (less than 70 mg/dL) with  cup of clear juice (cranberry or apple), 4 glucose tablets, OR glucose gel. - Recheck blood sugar in 15 minutes after treatment (to make sure it is greater than 70 mg/dL). If your blood sugar is not greater than 70 mg/dL on recheck, call [phone] for further instructions.   As of today, STOP taking any Aspirin (unless otherwise instructed by your surgeon) Aleve, Naproxen, Ibuprofen, Motrin, Advil, Goody's, BC's, all herbal medications, fish oil, and all vitamins.  Day of Surgery: Shower Wear Clean/Comfortable clothing the morning of surgery Do not apply any deodorants/lotions.               Do not wear jewelry, make up, or nail polish Remember to brush your teeth WITH YOUR REGULAR TOOTHPASTE.             Men may shave face and neck.             Do not bring valuables to the hospital.             [facility] is not responsible for any belongings or valuables.    Do NOT Smoke (Tobacco/Vaping) or drink Alcohol 24 hours prior to your procedure.  If you use a CPAP at night, you may bring all equipment for your overnight stay.   Contacts, glasses, dentures  or bridgework may not be worn into surgery.      Patients discharged the day of surgery will not be allowed to drive home, and someone needs to stay with them for 24 hours.

## 2020-11-07 ENCOUNTER — Ambulatory Visit (HOSPITAL_COMMUNITY)
Admission: RE | Admit: 2020-11-07 | Discharge: 2020-11-07 | Disposition: A | Discharge status: Home / Self Care | Payer: Medicare HMO | Attending: Vascular Surgery | Admitting: Vascular Surgery

## 2020-11-07 ENCOUNTER — Encounter (HOSPITAL_COMMUNITY): Payer: Self-pay

## 2020-11-07 ENCOUNTER — Other Ambulatory Visit: Payer: Self-pay

## 2020-11-07 ENCOUNTER — Ambulatory Visit (HOSPITAL_COMMUNITY): Payer: Medicare HMO | Admitting: Physician Assistant

## 2020-11-07 ENCOUNTER — Ambulatory Visit (HOSPITAL_COMMUNITY): Payer: Medicare HMO

## 2020-11-07 ENCOUNTER — Encounter (HOSPITAL_COMMUNITY)
Admission: RE | Disposition: A | Discharge status: Home / Self Care | Payer: Self-pay | Source: Home / Self Care | Attending: Vascular Surgery

## 2020-11-07 ENCOUNTER — Telehealth: Payer: Self-pay

## 2020-11-07 DIAGNOSIS — Z794 Long term (current) use of insulin: Secondary | ICD-10-CM | POA: Insufficient documentation

## 2020-11-07 DIAGNOSIS — Z7902 Long term (current) use of antithrombotics/antiplatelets: Secondary | ICD-10-CM | POA: Diagnosis not present

## 2020-11-07 DIAGNOSIS — T82818A Embolism of vascular prosthetic devices, implants and grafts, initial encounter: Secondary | ICD-10-CM | POA: Insufficient documentation

## 2020-11-07 DIAGNOSIS — E1122 Type 2 diabetes mellitus with diabetic chronic kidney disease: Secondary | ICD-10-CM | POA: Insufficient documentation

## 2020-11-07 DIAGNOSIS — N186 End stage renal disease: Secondary | ICD-10-CM | POA: Diagnosis not present

## 2020-11-07 DIAGNOSIS — I132 Hypertensive heart and chronic kidney disease with heart failure and with stage 5 chronic kidney disease, or end stage renal disease: Secondary | ICD-10-CM | POA: Diagnosis not present

## 2020-11-07 DIAGNOSIS — Z992 Dependence on renal dialysis: Secondary | ICD-10-CM | POA: Insufficient documentation

## 2020-11-07 DIAGNOSIS — N185 Chronic kidney disease, stage 5: Secondary | ICD-10-CM | POA: Diagnosis not present

## 2020-11-07 DIAGNOSIS — Y832 Surgical operation with anastomosis, bypass or graft as the cause of abnormal reaction of the patient, or of later complication, without mention of misadventure at the time of the procedure: Secondary | ICD-10-CM | POA: Insufficient documentation

## 2020-11-07 DIAGNOSIS — Z79899 Other long term (current) drug therapy: Secondary | ICD-10-CM | POA: Insufficient documentation

## 2020-11-07 DIAGNOSIS — I120 Hypertensive chronic kidney disease with stage 5 chronic kidney disease or end stage renal disease: Secondary | ICD-10-CM | POA: Diagnosis not present

## 2020-11-07 DIAGNOSIS — I5020 Unspecified systolic (congestive) heart failure: Secondary | ICD-10-CM | POA: Diagnosis not present

## 2020-11-07 HISTORY — PX: VASCULAR ACCESS DEVICE INSERTION: SHX5158

## 2020-11-07 LAB — POCT I-STAT, CHEM 8
BUN: 56 mg/dL — ABNORMAL HIGH (ref 6–20)
Calcium, Ion: 1.06 mmol/L — ABNORMAL LOW (ref 1.15–1.40)
Chloride: 101 mmol/L (ref 98–111)
Creatinine, Ser: 10.2 mg/dL — ABNORMAL HIGH (ref 0.61–1.24)
Glucose, Bld: 136 mg/dL — ABNORMAL HIGH (ref 70–99)
HCT: 35 % — ABNORMAL LOW (ref 39.0–52.0)
Hemoglobin: 11.9 g/dL — ABNORMAL LOW (ref 13.0–17.0)
Potassium: 5.3 mmol/L — ABNORMAL HIGH (ref 3.5–5.1)
Sodium: 138 mmol/L (ref 135–145)
TCO2: 27 mmol/L (ref 22–32)

## 2020-11-07 LAB — GLUCOSE, CAPILLARY
Glucose-Capillary: 147 mg/dL — ABNORMAL HIGH (ref 70–99)
Glucose-Capillary: 172 mg/dL — ABNORMAL HIGH (ref 70–99)

## 2020-11-07 LAB — PROTIME-INR
INR: 1.2 (ref 0.8–1.2)
Prothrombin Time: 14.9 seconds (ref 11.4–15.2)

## 2020-11-07 SURGERY — INSERTION, CATHETER, HERO
Anesthesia: Monitor Anesthesia Care | Site: Arm Upper | Laterality: Right

## 2020-11-07 MED ORDER — PROPOFOL 10 MG/ML IV BOLUS
INTRAVENOUS | Status: DC | PRN
  Administered 2020-11-07: 40 mg via INTRAVENOUS

## 2020-11-07 MED ORDER — PROPOFOL 500 MG/50ML IV EMUL
INTRAVENOUS | Status: DC | PRN
  Administered 2020-11-07: 70 ug/kg/min via INTRAVENOUS

## 2020-11-07 MED ORDER — ETOMIDATE 2 MG/ML IV SOLN
INTRAVENOUS | Status: DC | PRN
  Administered 2020-11-07: 20 mg via INTRAVENOUS

## 2020-11-07 MED ORDER — PHENYLEPHRINE 40 MCG/ML (10ML) SYRINGE FOR IV PUSH (FOR BLOOD PRESSURE SUPPORT)
PREFILLED_SYRINGE | INTRAVENOUS | Status: AC
  Filled 2020-11-07: qty 10

## 2020-11-07 MED ORDER — OXYCODONE-ACETAMINOPHEN 5-325 MG PO TABS: 1.0000 | ORAL_TABLET | Freq: Four times a day (QID) | ORAL | 0 refills | Status: DC | PRN

## 2020-11-07 MED ORDER — OXYCODONE HCL 5 MG/5ML PO SOLN: 5.0000 mg | Freq: Once | ORAL | Status: DC | PRN

## 2020-11-07 MED ORDER — IOHEXOL 300 MG/ML  SOLN
INTRAMUSCULAR | Status: DC | PRN
  Administered 2020-11-07: 10 mL

## 2020-11-07 MED ORDER — DEXTROSE 5 % IV SOLN
INTRAVENOUS | Status: DC | PRN
  Administered 2020-11-07: 3 g via INTRAVENOUS

## 2020-11-07 MED ORDER — ALBUMIN HUMAN 5 % IV SOLN
INTRAVENOUS | Status: AC
  Filled 2020-11-07: qty 250

## 2020-11-07 MED ORDER — CHLORHEXIDINE GLUCONATE 4 % EX LIQD: 60.0000 mL | Freq: Once | CUTANEOUS | Status: DC

## 2020-11-07 MED ORDER — PHENYLEPHRINE 40 MCG/ML (10ML) SYRINGE FOR IV PUSH (FOR BLOOD PRESSURE SUPPORT)
PREFILLED_SYRINGE | INTRAVENOUS | Status: DC | PRN
  Administered 2020-11-07 (×3): 80 ug via INTRAVENOUS

## 2020-11-07 MED ORDER — MIDAZOLAM HCL 2 MG/2ML IJ SOLN
INTRAMUSCULAR | Status: DC | PRN
  Administered 2020-11-07: 1 mg via INTRAVENOUS

## 2020-11-07 MED ORDER — ALBUMIN HUMAN 5 % IV SOLN
12.5000 g | Freq: Once | INTRAVENOUS | Status: AC
  Administered 2020-11-07: 12.5 g via INTRAVENOUS

## 2020-11-07 MED ORDER — ROCURONIUM BROMIDE 10 MG/ML (PF) SYRINGE
PREFILLED_SYRINGE | INTRAVENOUS | Status: AC
  Filled 2020-11-07: qty 10

## 2020-11-07 MED ORDER — FENTANYL CITRATE (PF) 250 MCG/5ML IJ SOLN
INTRAMUSCULAR | Status: AC
  Filled 2020-11-07: qty 5

## 2020-11-07 MED ORDER — LIDOCAINE-EPINEPHRINE (PF) 1 %-1:200000 IJ SOLN
INTRAMUSCULAR | Status: AC
  Filled 2020-11-07: qty 30

## 2020-11-07 MED ORDER — OXYCODONE HCL 5 MG PO TABS: 5.0000 mg | ORAL_TABLET | Freq: Once | ORAL | Status: DC | PRN

## 2020-11-07 MED ORDER — DEXAMETHASONE SODIUM PHOSPHATE 10 MG/ML IJ SOLN
INTRAMUSCULAR | Status: DC | PRN
  Administered 2020-11-07: 10 mg via INTRAVENOUS

## 2020-11-07 MED ORDER — MEPERIDINE HCL 25 MG/ML IJ SOLN: 6.2500 mg | INTRAMUSCULAR | Status: DC | PRN

## 2020-11-07 MED ORDER — LIDOCAINE HCL (CARDIAC) PF 100 MG/5ML IV SOSY
PREFILLED_SYRINGE | INTRAVENOUS | Status: DC | PRN
Start: 2020-11-07 — End: 2020-11-07
  Administered 2020-11-07: 100 mg via INTRATRACHEAL

## 2020-11-07 MED ORDER — SODIUM CHLORIDE 0.9 % IV SOLN
INTRAVENOUS | Status: DC | PRN
  Administered 2020-11-07: 500 mL

## 2020-11-07 MED ORDER — ORAL CARE MOUTH RINSE: 15.0000 mL | Freq: Once | OROMUCOSAL | Status: AC

## 2020-11-07 MED ORDER — MIDAZOLAM HCL 2 MG/2ML IJ SOLN
0.5000 mg | Freq: Once | INTRAMUSCULAR | Status: DC | PRN
Start: 2020-11-07 — End: 2020-11-07

## 2020-11-07 MED ORDER — SUGAMMADEX SODIUM 200 MG/2ML IV SOLN
INTRAVENOUS | Status: DC | PRN
  Administered 2020-11-07: 300 mg via INTRAVENOUS

## 2020-11-07 MED ORDER — ONDANSETRON HCL 4 MG/2ML IJ SOLN
INTRAMUSCULAR | Status: DC | PRN
  Administered 2020-11-07: 4 mg via INTRAVENOUS

## 2020-11-07 MED ORDER — CHLORHEXIDINE GLUCONATE 0.12 % MT SOLN
15.0000 mL | Freq: Once | OROMUCOSAL | Status: AC
  Administered 2020-11-07: 15 mL via OROMUCOSAL
  Filled 2020-11-07: qty 15

## 2020-11-07 MED ORDER — SODIUM CHLORIDE 0.9 % IV SOLN
INTRAVENOUS | Status: AC
  Filled 2020-11-07: qty 1.2

## 2020-11-07 MED ORDER — HYDROMORPHONE HCL 1 MG/ML IJ SOLN: 0.2500 mg | INTRAMUSCULAR | Status: DC | PRN

## 2020-11-07 MED ORDER — DEXAMETHASONE SODIUM PHOSPHATE 10 MG/ML IJ SOLN
INTRAMUSCULAR | Status: AC
  Filled 2020-11-07: qty 1

## 2020-11-07 MED ORDER — FENTANYL CITRATE (PF) 250 MCG/5ML IJ SOLN
INTRAMUSCULAR | Status: DC | PRN
  Administered 2020-11-07: 50 ug via INTRAVENOUS
  Administered 2020-11-07: 25 ug via INTRAVENOUS

## 2020-11-07 MED ORDER — ONDANSETRON HCL 4 MG/2ML IJ SOLN
INTRAMUSCULAR | Status: AC
  Filled 2020-11-07: qty 2

## 2020-11-07 MED ORDER — PHENYLEPHRINE HCL-NACL 10-0.9 MG/250ML-% IV SOLN
INTRAVENOUS | Status: DC | PRN
  Administered 2020-11-07: 30 ug/min via INTRAVENOUS
  Administered 2020-11-07: 50 ug/min via INTRAVENOUS

## 2020-11-07 MED ORDER — LIDOCAINE 2% (20 MG/ML) 5 ML SYRINGE
INTRAMUSCULAR | Status: AC
  Filled 2020-11-07: qty 5

## 2020-11-07 MED ORDER — SODIUM CHLORIDE 0.9 % IV SOLN
INTRAVENOUS | Status: DC
  Administered 2020-11-07: 10 mL/h via INTRAVENOUS

## 2020-11-07 MED ORDER — MIDAZOLAM HCL 2 MG/2ML IJ SOLN
INTRAMUSCULAR | Status: AC
  Filled 2020-11-07: qty 2

## 2020-11-07 MED ORDER — 0.9 % SODIUM CHLORIDE (POUR BTL) OPTIME
TOPICAL | Status: DC | PRN
  Administered 2020-11-07: 1000 mL

## 2020-11-07 MED ORDER — LIDOCAINE-EPINEPHRINE (PF) 1.5 %-1:200000 IJ SOLN
INTRAMUSCULAR | Status: DC | PRN
  Administered 2020-11-07: 30 mL via PERINEURAL

## 2020-11-07 MED ORDER — ETOMIDATE 2 MG/ML IV SOLN
INTRAVENOUS | Status: AC
  Filled 2020-11-07: qty 10

## 2020-11-07 MED ORDER — ROCURONIUM BROMIDE 100 MG/10ML IV SOLN
INTRAVENOUS | Status: DC | PRN
  Administered 2020-11-07: 50 mg via INTRAVENOUS

## 2020-11-07 SURGICAL SUPPLY — 52 items
ADH SKN CLS APL DERMABOND .7 (GAUZE/BANDAGES/DRESSINGS) ×1
ARMBAND PINK RESTRICT EXTREMIT (MISCELLANEOUS) ×2 IMPLANT
CANISTER SUCT 3000ML PPV (MISCELLANEOUS) ×2 IMPLANT
CANNULA VESSEL 3MM 2 BLNT TIP (CANNULA) ×2 IMPLANT
CLIP VESOCCLUDE MED 6/CT (CLIP) ×2 IMPLANT
CLIP VESOCCLUDE SM WIDE 6/CT (CLIP) ×2 IMPLANT
COMPONENT HERO ACCESSORY KIT (VASCULAR PRODUCTS) ×2 IMPLANT
COMPONENT HERO ARTERIAL GRAFT (Vascular Products) ×2 IMPLANT
COMPONENT HERO VENOUS OVERFLOW (Vascular Products) ×2 IMPLANT
COVER PROBE W GEL 5X96 (DRAPES) ×2 IMPLANT
COVER SURGICAL LIGHT HANDLE (MISCELLANEOUS) ×2 IMPLANT
COVER WAND RF STERILE (DRAPES) ×2 IMPLANT
DERMABOND ADVANCED (GAUZE/BANDAGES/DRESSINGS) ×1
DERMABOND ADVANCED .7 DNX12 (GAUZE/BANDAGES/DRESSINGS) ×1 IMPLANT
DRAPE C-ARM 42X72 X-RAY (DRAPES) ×2 IMPLANT
DRAPE CHEST BREAST 15X10 FENES (DRAPES) IMPLANT
ELECT REM PT RETURN 9FT ADLT (ELECTROSURGICAL) ×2
ELECTRODE REM PT RTRN 9FT ADLT (ELECTROSURGICAL) ×1 IMPLANT
GLOVE BIOGEL PI IND STRL 7.5 (GLOVE) ×1 IMPLANT
GLOVE BIOGEL PI INDICATOR 7.5 (GLOVE) ×1
GLOVE SURG SS PI 7.5 STRL IVOR (GLOVE) ×2 IMPLANT
GOWN STRL REUS W/ TWL LRG LVL3 (GOWN DISPOSABLE) ×2 IMPLANT
GOWN STRL REUS W/ TWL XL LVL3 (GOWN DISPOSABLE) ×1 IMPLANT
GOWN STRL REUS W/TWL LRG LVL3 (GOWN DISPOSABLE) ×4
GOWN STRL REUS W/TWL XL LVL3 (GOWN DISPOSABLE) ×2
GRAFT VASC ACUSEAL 4-7X45 (Vascular Products) ×2 IMPLANT
GUIDEWIRE ANGLED .035X150CM (WIRE) ×2 IMPLANT
HEMOSTAT SNOW SURGICEL 2X4 (HEMOSTASIS) IMPLANT
KIT BASIN OR (CUSTOM PROCEDURE TRAY) ×2 IMPLANT
KIT TURNOVER KIT B (KITS) ×2 IMPLANT
NEEDLE PERC 18GX7CM (NEEDLE) ×2 IMPLANT
NS IRRIG 1000ML POUR BTL (IV SOLUTION) ×2 IMPLANT
PACK CV ACCESS (CUSTOM PROCEDURE TRAY) ×2 IMPLANT
PAD ARMBOARD 7.5X6 YLW CONV (MISCELLANEOUS) ×4 IMPLANT
SET MICROPUNCTURE 5F STIFF (MISCELLANEOUS) ×2 IMPLANT
SHEATH PINNACLE 5F 10CM (SHEATH) ×2 IMPLANT
SHEATH PINNACLE 6F 10CM (SHEATH) ×2 IMPLANT
SUT PROLENE 5 0 C 1 24 (SUTURE) ×4 IMPLANT
SUT PROLENE 6 0 BV (SUTURE) ×4 IMPLANT
SUT SILK 0 SH 30 (SUTURE) ×2 IMPLANT
SUT VIC AB 3-0 SH 27 (SUTURE) ×2
SUT VIC AB 3-0 SH 27X BRD (SUTURE) ×1 IMPLANT
SUT VICRYL 4-0 PS2 18IN ABS (SUTURE) ×2 IMPLANT
SYR 30ML LL (SYRINGE) IMPLANT
SYR 5ML LL (SYRINGE) ×2 IMPLANT
TOWEL GREEN STERILE (TOWEL DISPOSABLE) ×2 IMPLANT
TUBE CONNECTING 12X1/4 (SUCTIONS) ×2 IMPLANT
UNDERPAD 30X36 HEAVY ABSORB (UNDERPADS AND DIAPERS) ×2 IMPLANT
WATER STERILE IRR 1000ML POUR (IV SOLUTION) ×2 IMPLANT
WIRE AMPLATZ SS-J .035X180CM (WIRE) ×2 IMPLANT
WIRE BENTSON .035X145CM (WIRE) IMPLANT
WIRE J 3MM .035X145CM (WIRE) ×2 IMPLANT

## 2020-11-07 NOTE — Anesthesia Postprocedure Evaluation (Signed)
Anesthesia Post Note  Patient: [name] [name]  Procedure(s) Performed: RIGHT UPPER EXTREMITY HERO GRAFT (Right Arm Upper)     Patient location during evaluation: PACU Anesthesia Type: Regional and General Level of consciousness: awake and alert, patient cooperative and oriented Pain management: pain level controlled (pt very comfortable) Vital Signs Assessment: post-procedure vital signs reviewed and stable Respiratory status: spontaneous breathing, nonlabored ventilation and respiratory function stable Cardiovascular status: blood pressure returned to baseline and stable Postop Assessment: no apparent nausea or vomiting Anesthetic complications: no   No complications documented.  Last Vitals:  Vitals:   11/07/20 1156 11/07/20 1200  BP: (!) 112/51 (!) 111/56  Pulse: 88 87  Resp: 14 16  Temp:  36.7 C  SpO2: 94% 93%    Last Pain:  Vitals:   11/07/20 1200  TempSrc:   PainSc: 0-No pain                 Saanvi Hakala,E. Damara Klunder

## 2020-11-07 NOTE — Transfer of Care (Signed)
Immediate Anesthesia Transfer of Care Note  Patient: [name] [name]  Procedure(s) Performed: RIGHT UPPER EXTREMITY HERO GRAFT (Right Arm Upper)  Patient Location: PACU  Anesthesia Type:GA combined with regional for post-op pain  Level of Consciousness: awake and alert   Airway & Oxygen Therapy: Patient Spontanous Breathing and Patient connected to face mask oxygen  Post-op Assessment: Report given to RN and Post -op Vital signs reviewed and stable  Post vital signs: Reviewed and stable  Last Vitals:  Vitals Value Taken Time  BP 92/56 11/07/20 1031  Temp    Pulse 100 11/07/20 1035  Resp 17 11/07/20 1035  SpO2 94 % 11/07/20 1035  Vitals shown include unvalidated device data.  Last Pain:  Vitals:   11/07/20 0635  TempSrc:   PainSc: 0-No pain      Patients Stated Pain Goal: 5 (86/77/37 3668)  Complications: No complications documented.

## 2020-11-07 NOTE — Op Note (Signed)
DATE OF SERVICE: 11/07/2020  PATIENT:  [name] [name]  55 y.o. male  PRE-OPERATIVE DIAGNOSIS:  ESRD in need of permanent access  POST-OPERATIVE DIAGNOSIS:  Same  PROCEDURE:   1) right upper extremity HeRO graft (right brachial - SVC) 2) right internal jugular venogram (17mL contrast)  SURGEON:  Surgeon(s) and Role:    * [name], MD - Primary    * Waynetta Sandy, MD - Assisting  ASSISTANT: Servando Snare, MD  An assistant was required to facilitate exposure and expedite the case.  ANESTHESIA:   general  EBL: 180mL  BLOOD ADMINISTERED:none  DRAINS: none   LOCAL MEDICATIONS USED:  NONE  SPECIMEN:  none  COUNTS: confirmed correct.  TOURNIQUET:  none  PATIENT DISPOSITION:  PACU - hemodynamically stable.   Delay start of Pharmacological VTE agent (>24hrs) due to surgical blood loss or risk of bleeding: no  INDICATION FOR PROCEDURE: [name] [name] is a 55 y.o. male with ESRD in need of HD access. He has exhausted many upper extremity dialysis access options. After careful discussion of risks, benefits, and alternatives the patient was offered HeRO graft placement. We specifically discussed risk of graft failure. The patient understood and wished to proceed.  OPERATIVE FINDINGS: SVC occluded at the [facility] catheter. Unable to pass new access from cranial approach. Wired the dialysis catheter to introduce the HeRO catheter. Placed an accuseal graft to the HeRO catheter to allow for early access.  Access can be used as early as Architectural technologist.  DESCRIPTION OF PROCEDURE: After identification of the patient in the pre-operative holding area, the patient was transferred to the operating room. The patient was positioned supine on the operating room table. Anesthesia was induced. The right neck, chest and arm were prepped and draped in standard fashion. A surgical pause was performed confirming correct patient, procedure, and operative location.  Using a transverse incision,  the brachial artery and most recent AVG was exposed. The artery was encircled with silastic vessel loops.   Using micropuncture technique, the right internal jugular vein was accessed. The micro sheath was introduced into the jugular vein. A wire would not pass the area of the catheter.  A venogram was performed to confirm SVC occlusion about the catheter.  We change our plan and decided to expose the catheter in the base of the neck.  The catheter was encircled and transected.  A stiff Amplatz wire was threaded through the catheter into the right atrium.  The catheter remnant was removed.  Over the wire a 98 French [name]-away sheath was introduced into the superior vena cava.  The merit hero outflow catheter was introduced through the [name]-away sheath into position in the right atrium.  [name]-away sheath was removed.  The wire and dilator were removed.  Fluoroscopy was used intermittently to confirm adequate position of the catheter.  The venous sheath was tunneled through the superior shoulder to a transverse incision over the right deltopectoral groove.  The venous sheath was externalized here and clamped.   Subcutaneous tunnel was created between the brachial artery exposure and the counterincision of the right pectoral groove.  A 4 to 7 mm AcuSeal taper graft was then delivered through the subcutaneous tunnel.  The vascular graft component of the hero graft was then mounted on the venous catheter.  This was doubly secured with an 0 silk suture.  A small cuff of graft material was left in beveled to allow end-to-end anastomosis with the 7 mm aspect of the AcuSeal graft.  End-to-side anastomosis  between the cuff of the hero graft and the 7 mm AcuSeal graft was performed with 5-0 Prolene.  End-to-side anastomosis between the remnant AV graft near the brachial artery in the 4 mm end of the new AcuSeal graft was performed with 6-0 Prolene.  Prior to completion the anastomoses were flushed and de-aired.  The  anastomoses were completed and clamps were released.  Palpable thrill was confirmed in the graft.  Doppler flow in the radial artery was confirmed.  The wounds were copiously irrigated.  The wounds were closed in layers using 3-0 Vicryl, 4-0 Monocryl, Dermabond.  Upon completion of the case instrument and sharps counts were confirmed correct. The patient was transferred to the PACU in good condition. I was present for all portions of the procedure.  Yevonne Aline. Stanford Breed, MD [facility] Phone Number: [phone] 11/07/2020 10:15 AM

## 2020-11-07 NOTE — Anesthesia Procedure Notes (Signed)
Anesthesia Regional Block: Supraclavicular block   Pre-Anesthetic Checklist: ,, timeout performed, Correct Patient, Correct Site, Correct Laterality, Correct Procedure, Correct Position, site marked, Risks and benefits discussed,  Surgical consent,  Pre-op evaluation,  At surgeon's request and post-op pain management  Laterality: Right and Upper  Prep: chloraprep       Needles:  Injection technique: Single-shot  Needle Type: Echogenic Needle     Needle Length: 9cm  Needle Gauge: 21     Additional Needles:   Procedures:,,,, ultrasound used (permanent image in chart),,,,  Narrative:  Start time: 11/07/2020 7:15 AM End time: 11/07/2020 7:26 AM Injection made incrementally with aspirations every 5 mL.  Performed by: Personally  Anesthesiologist: Annye Asa, MD  Additional Notes: Pt identified in Holding room.  Monitors applied. Working IV access confirmed. Sterile prep L clavicle and neck.  #21ga ECHOgenic Arrow block needle to supraclavicular brachial plexus with US guidance.  30cc 1.5% Lidocaine with 1:200k epi injected incrementally after negative test dose.  Patient asymptomatic, VSS, no heme aspirated, tolerated well.  Jenita Seashore, MD

## 2020-11-07 NOTE — Interval H&P Note (Signed)
History and Physical Interval Note:  11/07/2020 7:18 AM  [name] [name]  has presented today for surgery, with the diagnosis of ESRD.  The various methods of treatment have been discussed with the patient and family. After consideration of risks, benefits and other options for treatment, the patient has consented to  Procedure(s): RIGHT UPPER EXTREMITY HERO GRAFT (Right) as a surgical intervention.  The patient's history has been reviewed, patient examined, no change in status, stable for surgery.  I have reviewed the patient's chart and labs.  Questions were answered to the patient's satisfaction.     [name]

## 2020-11-07 NOTE — Anesthesia Procedure Notes (Signed)
Procedure Name: Intubation Date/Time: 11/07/2020 8:03 AM Performed by: Thelma Comp, CRNA Pre-anesthesia Checklist: Patient identified, Emergency Drugs available, Suction available and Patient being monitored Patient Re-evaluated:Patient Re-evaluated prior to induction Oxygen Delivery Method: Circle System Utilized Preoxygenation: Pre-oxygenation with 100% oxygen Induction Type: IV induction Ventilation: Mask ventilation without difficulty Laryngoscope Size: Glidescope and 4 Grade View: Grade I Tube type: Oral Tube size: 7.5 mm Number of attempts: 1 Airway Equipment and Method: Stylet and Oral airway Placement Confirmation: ETT inserted through vocal cords under direct vision,  positive ETCO2 and breath sounds checked- equal and bilateral Secured at: 23 cm Tube secured with: Tape Dental Injury: Teeth and Oropharynx as per pre-operative assessment

## 2020-11-07 NOTE — Progress Notes (Signed)
Orthopedic Tech Progress Note Patient Details:  [name] [name] 1966/06/06 [id number] Left sling with RN Ortho Devices Type of Ortho Device: Arm sling Ortho Device/Splint Interventions: Ordered   Post Interventions Patient Tolerated: Other (comment) Instructions Provided: Other (comment)   [name] 11/07/2020, 11:34 AM

## 2020-11-07 NOTE — Telephone Encounter (Signed)
Pt called after his procedure with questions about HD accessing his new access. He asked me to call HD center, as he has HD tomorrow. I spoke with nurse at HD center and read him the Op Note and he is aware the Hero graft can be accessed tomorrow, per surgeon. No further questions/concerns at this time.

## 2020-11-07 NOTE — Discharge Instructions (Signed)
° °  [facility] ° °Discharge Instructions ° °AV Fistula or Graft Surgery for Dialysis Access ° °Please refer to the following instructions for your post-procedure care. Your surgeon or physician assistant will discuss any changes with you. ° °Activity ° °You may drive the day following your surgery, if you are comfortable and no longer taking prescription pain medication. Resume full activity as the soreness in your incision resolves. ° °Bathing/Showering ° °You may shower after you go home. Keep your incision dry for 48 hours. Do not soak in a bathtub, hot tub, or swim until the incision heals completely. You may not shower if you have a hemodialysis catheter. ° °Incision Care ° °Clean your incision with mild soap and water after 48 hours. Pat the area dry with a clean towel. You do not need a bandage unless otherwise instructed. Do not apply any ointments or creams to your incision. You may have skin glue on your incision. Do not peel it off. It will come off on its own in about one week. Your arm may swell a bit after surgery. To reduce swelling use pillows to elevate your arm so it is above your heart. Your doctor will tell you if you need to lightly wrap your arm with an ACE bandage. ° °Diet ° °Resume your normal diet. There are not special food restrictions following this procedure. In order to heal from your surgery, it is CRITICAL to get adequate nutrition. Your body requires vitamins, minerals, and protein. Vegetables are the best source of vitamins and minerals. Vegetables also provide the perfect balance of protein. Processed food has little nutritional value, so try to avoid this. ° °Medications ° °Resume taking all of your medications. If your incision is causing pain, you may take over-the counter pain relievers such as acetaminophen (Tylenol). If you were prescribed a stronger pain medication, please be aware these medications can cause nausea and constipation. Prevent  nausea by taking the medication with a snack or meal. Avoid constipation by drinking plenty of fluids and eating foods with high amount of fiber, such as fruits, vegetables, and grains. Do not take Tylenol if you are taking prescription pain medications. ° ° ° ° °Follow up °Your surgeon may want to see you in the office following your access surgery. If so, this will be arranged at the time of your surgery. ° °Please call us immediately for any of the following conditions: ° °Increased pain, redness, drainage (pus) from your incision site °Fever of 101 degrees or higher °Severe or worsening pain at your incision site °Hand pain or numbness. ° °Reduce your risk of vascular disease: ° °Stop smoking. If you would like help, call [facility] at 1-800-QUIT-NOW (1-800-784-8669) or [facility] at 336-586-4000 ° °Manage your cholesterol °Maintain a desired weight °Control your diabetes °Keep your blood pressure down ° °Dialysis ° °It will take several weeks to several months for your new dialysis access to be ready for use. Your surgeon will determine when it is OK to use it. Your nephrologist will continue to direct your dialysis. You can continue to use your Permcath until your new access is ready for use. ° °If you have any questions, please call the office at [phone]. ° °

## 2020-11-08 ENCOUNTER — Ambulatory Visit (HOSPITAL_COMMUNITY): Payer: Medicare HMO

## 2020-11-08 ENCOUNTER — Ambulatory Visit (HOSPITAL_COMMUNITY): Payer: Medicare HMO | Admitting: Physician Assistant

## 2020-11-08 ENCOUNTER — Encounter (HOSPITAL_COMMUNITY): Payer: Self-pay | Admitting: Vascular Surgery

## 2020-11-08 ENCOUNTER — Encounter (HOSPITAL_COMMUNITY)
Admission: RE | Disposition: A | Discharge status: Home / Self Care | Payer: Self-pay | Source: Ambulatory Visit | Attending: Vascular Surgery

## 2020-11-08 ENCOUNTER — Other Ambulatory Visit: Payer: Self-pay

## 2020-11-08 ENCOUNTER — Observation Stay (HOSPITAL_COMMUNITY)
Admission: RE | Admit: 2020-11-08 | Discharge: 2020-11-10 | Disposition: A | Discharge status: Home / Self Care | Payer: Medicare HMO | Source: Ambulatory Visit | Attending: Vascular Surgery | Admitting: Vascular Surgery

## 2020-11-08 DIAGNOSIS — Y848 Other medical procedures as the cause of abnormal reaction of the patient, or of later complication, without mention of misadventure at the time of the procedure: Secondary | ICD-10-CM | POA: Diagnosis not present

## 2020-11-08 DIAGNOSIS — T82868A Thrombosis of vascular prosthetic devices, implants and grafts, initial encounter: Secondary | ICD-10-CM | POA: Diagnosis not present

## 2020-11-08 DIAGNOSIS — E1122 Type 2 diabetes mellitus with diabetic chronic kidney disease: Secondary | ICD-10-CM | POA: Diagnosis not present

## 2020-11-08 DIAGNOSIS — T8241XA Breakdown (mechanical) of vascular dialysis catheter, initial encounter: Secondary | ICD-10-CM | POA: Diagnosis not present

## 2020-11-08 DIAGNOSIS — I2510 Atherosclerotic heart disease of native coronary artery without angina pectoris: Secondary | ICD-10-CM | POA: Insufficient documentation

## 2020-11-08 DIAGNOSIS — Z7901 Long term (current) use of anticoagulants: Secondary | ICD-10-CM | POA: Diagnosis not present

## 2020-11-08 DIAGNOSIS — Z79899 Other long term (current) drug therapy: Secondary | ICD-10-CM | POA: Diagnosis not present

## 2020-11-08 DIAGNOSIS — Z992 Dependence on renal dialysis: Secondary | ICD-10-CM | POA: Diagnosis not present

## 2020-11-08 DIAGNOSIS — Z95828 Presence of other vascular implants and grafts: Secondary | ICD-10-CM

## 2020-11-08 DIAGNOSIS — N186 End stage renal disease: Secondary | ICD-10-CM | POA: Diagnosis not present

## 2020-11-08 DIAGNOSIS — E1140 Type 2 diabetes mellitus with diabetic neuropathy, unspecified: Secondary | ICD-10-CM | POA: Diagnosis not present

## 2020-11-08 DIAGNOSIS — N185 Chronic kidney disease, stage 5: Secondary | ICD-10-CM | POA: Diagnosis not present

## 2020-11-08 DIAGNOSIS — I120 Hypertensive chronic kidney disease with stage 5 chronic kidney disease or end stage renal disease: Secondary | ICD-10-CM | POA: Insufficient documentation

## 2020-11-08 DIAGNOSIS — I4891 Unspecified atrial fibrillation: Secondary | ICD-10-CM | POA: Insufficient documentation

## 2020-11-08 DIAGNOSIS — J449 Chronic obstructive pulmonary disease, unspecified: Secondary | ICD-10-CM | POA: Diagnosis not present

## 2020-11-08 DIAGNOSIS — I517 Cardiomegaly: Secondary | ICD-10-CM | POA: Diagnosis not present

## 2020-11-08 HISTORY — PX: INSERTION OF DIALYSIS CATHETER: SHX1324

## 2020-11-08 HISTORY — PX: AVGG REMOVAL: SHX5153

## 2020-11-08 LAB — POCT I-STAT, CHEM 8
BUN: 75 mg/dL — ABNORMAL HIGH (ref 6–20)
Calcium, Ion: 0.92 mmol/L — ABNORMAL LOW (ref 1.15–1.40)
Chloride: 104 mmol/L (ref 98–111)
Creatinine, Ser: 12.5 mg/dL — ABNORMAL HIGH (ref 0.61–1.24)
Glucose, Bld: 183 mg/dL — ABNORMAL HIGH (ref 70–99)
HCT: 30 % — ABNORMAL LOW (ref 39.0–52.0)
Hemoglobin: 10.2 g/dL — ABNORMAL LOW (ref 13.0–17.0)
Potassium: 6.3 mmol/L (ref 3.5–5.1)
Sodium: 134 mmol/L — ABNORMAL LOW (ref 135–145)
TCO2: 22 mmol/L (ref 22–32)

## 2020-11-08 LAB — PROTIME-INR
INR: 1.3 — ABNORMAL HIGH (ref 0.8–1.2)
Prothrombin Time: 15.4 seconds — ABNORMAL HIGH (ref 11.4–15.2)

## 2020-11-08 LAB — GLUCOSE, CAPILLARY
Glucose-Capillary: 121 mg/dL — ABNORMAL HIGH (ref 70–99)
Glucose-Capillary: 99 mg/dL (ref 70–99)
Glucose-Capillary: 146 mg/dL — ABNORMAL HIGH (ref 70–99)

## 2020-11-08 SURGERY — INSERTION OF DIALYSIS CATHETER
Anesthesia: General | Site: Chest | Laterality: Right

## 2020-11-08 MED ORDER — LIDOCAINE 2% (20 MG/ML) 5 ML SYRINGE
INTRAMUSCULAR | Status: DC | PRN
  Administered 2020-11-08: 100 mg via INTRAVENOUS

## 2020-11-08 MED ORDER — FENTANYL CITRATE (PF) 100 MCG/2ML IJ SOLN: 25.0000 ug | INTRAMUSCULAR | Status: DC | PRN

## 2020-11-08 MED ORDER — SODIUM CHLORIDE 0.9 % IV SOLN
INTRAVENOUS | Status: DC | PRN
  Administered 2020-11-08: 500 mL

## 2020-11-08 MED ORDER — MEPERIDINE HCL 25 MG/ML IJ SOLN: 6.2500 mg | INTRAMUSCULAR | Status: DC | PRN

## 2020-11-08 MED ORDER — MIDAZOLAM HCL 5 MG/5ML IJ SOLN
INTRAMUSCULAR | Status: DC | PRN
  Administered 2020-11-08: 2 mg via INTRAVENOUS

## 2020-11-08 MED ORDER — INSULIN GLARGINE 100 UNIT/ML ~~LOC~~ SOLN
80.0000 [IU] | Freq: Two times a day (BID) | SUBCUTANEOUS | Status: DC
  Administered 2020-11-09 (×3): 80 [IU] via SUBCUTANEOUS
  Filled 2020-11-08 (×7): qty 0.8

## 2020-11-08 MED ORDER — FERRIC CITRATE 1 GM 210 MG(FE) PO TABS
630.0000 mg | ORAL_TABLET | Freq: Three times a day (TID) | ORAL | Status: DC
  Administered 2020-11-09 – 2020-11-10 (×4): 630 mg via ORAL
  Filled 2020-11-08 (×5): qty 3

## 2020-11-08 MED ORDER — OXYCODONE HCL 5 MG PO TABS
5.0000 mg | ORAL_TABLET | Freq: Once | ORAL | Status: DC | PRN
Start: 2020-11-08 — End: 2020-11-08

## 2020-11-08 MED ORDER — OXYCODONE HCL 5 MG PO TABS: 5.0000 mg | ORAL_TABLET | ORAL | Status: DC | PRN

## 2020-11-08 MED ORDER — HYDRALAZINE HCL 20 MG/ML IJ SOLN
5.0000 mg | INTRAMUSCULAR | Status: DC | PRN
Start: 2020-11-08 — End: 2020-11-10

## 2020-11-08 MED ORDER — SODIUM ZIRCONIUM CYCLOSILICATE 10 G PO PACK
10.0000 g | PACK | Freq: Every day | ORAL | Status: DC
  Administered 2020-11-09: 10 g via ORAL
  Filled 2020-11-08 (×2): qty 1

## 2020-11-08 MED ORDER — LIDOCAINE 2% (20 MG/ML) 5 ML SYRINGE
INTRAMUSCULAR | Status: AC
  Filled 2020-11-08: qty 5

## 2020-11-08 MED ORDER — ALUM & MAG HYDROXIDE-SIMETH 200-200-20 MG/5ML PO SUSP: 15.0000 mL | ORAL | Status: DC | PRN

## 2020-11-08 MED ORDER — ACETAMINOPHEN 10 MG/ML IV SOLN: 1000.0000 mg | Freq: Once | INTRAVENOUS | Status: DC | PRN

## 2020-11-08 MED ORDER — OXYCODONE HCL 5 MG/5ML PO SOLN: 5.0000 mg | Freq: Once | ORAL | Status: DC | PRN

## 2020-11-08 MED ORDER — ACETAMINOPHEN 325 MG PO TABS
650.0000 mg | ORAL_TABLET | Freq: Four times a day (QID) | ORAL | Status: DC
  Administered 2020-11-08 – 2020-11-10 (×5): 650 mg via ORAL
  Filled 2020-11-08 (×5): qty 2

## 2020-11-08 MED ORDER — FENTANYL CITRATE (PF) 250 MCG/5ML IJ SOLN
INTRAMUSCULAR | Status: AC
  Filled 2020-11-08: qty 5

## 2020-11-08 MED ORDER — ONDANSETRON HCL 4 MG/2ML IJ SOLN: 4.0000 mg | Freq: Once | INTRAMUSCULAR | Status: DC | PRN

## 2020-11-08 MED ORDER — SEVELAMER CARBONATE 800 MG PO TABS
2400.0000 mg | ORAL_TABLET | Freq: Three times a day (TID) | ORAL | Status: DC
  Administered 2020-11-09 – 2020-11-10 (×3): 2400 mg via ORAL
  Filled 2020-11-08 (×3): qty 3

## 2020-11-08 MED ORDER — FENTANYL CITRATE (PF) 100 MCG/2ML IJ SOLN
INTRAMUSCULAR | Status: DC | PRN
  Administered 2020-11-08: 50 ug via INTRAVENOUS

## 2020-11-08 MED ORDER — 0.9 % SODIUM CHLORIDE (POUR BTL) OPTIME
TOPICAL | Status: DC | PRN
  Administered 2020-11-08: 1000 mL

## 2020-11-08 MED ORDER — ONDANSETRON HCL 4 MG/2ML IJ SOLN
INTRAMUSCULAR | Status: DC | PRN
  Administered 2020-11-08: 4 mg via INTRAVENOUS

## 2020-11-08 MED ORDER — FENTANYL CITRATE (PF) 100 MCG/2ML IJ SOLN: 50.0000 ug | Freq: Once | INTRAMUSCULAR | Status: AC

## 2020-11-08 MED ORDER — FENTANYL CITRATE (PF) 100 MCG/2ML IJ SOLN
INTRAMUSCULAR | Status: AC
  Administered 2020-11-08: 50 ug via INTRAVENOUS
  Filled 2020-11-08: qty 2

## 2020-11-08 MED ORDER — PROPOFOL 10 MG/ML IV BOLUS
INTRAVENOUS | Status: DC | PRN
  Administered 2020-11-08: 200 mg via INTRAVENOUS

## 2020-11-08 MED ORDER — FENTANYL CITRATE (PF) 100 MCG/2ML IJ SOLN
INTRAMUSCULAR | Status: AC
  Filled 2020-11-08: qty 2

## 2020-11-08 MED ORDER — INSULIN ASPART 100 UNIT/ML ~~LOC~~ SOLN
0.0000 [IU] | Freq: Three times a day (TID) | SUBCUTANEOUS | Status: DC
  Administered 2020-11-09: 3 [IU] via SUBCUTANEOUS

## 2020-11-08 MED ORDER — PATIROMER SORBITEX CALCIUM 8.4 G PO PACK
8.4000 g | PACK | Freq: Every day | ORAL | Status: DC
  Administered 2020-11-09: 8.4 g via ORAL
  Filled 2020-11-08 (×3): qty 1

## 2020-11-08 MED ORDER — SODIUM CHLORIDE 0.9 % IV SOLN: INTRAVENOUS | Status: DC

## 2020-11-08 MED ORDER — ATORVASTATIN CALCIUM 80 MG PO TABS
80.0000 mg | ORAL_TABLET | Freq: Every day | ORAL | Status: DC
  Administered 2020-11-08 – 2020-11-10 (×3): 80 mg via ORAL
  Filled 2020-11-08 (×3): qty 1

## 2020-11-08 MED ORDER — CLOPIDOGREL BISULFATE 75 MG PO TABS
75.0000 mg | ORAL_TABLET | Freq: Every day | ORAL | Status: DC
  Administered 2020-11-09 – 2020-11-10 (×2): 75 mg via ORAL
  Filled 2020-11-08 (×2): qty 1

## 2020-11-08 MED ORDER — PROPOFOL 10 MG/ML IV BOLUS
INTRAVENOUS | Status: AC
  Filled 2020-11-08: qty 20

## 2020-11-08 MED ORDER — MORPHINE SULFATE (PF) 4 MG/ML IV SOLN
4.0000 mg | INTRAVENOUS | Status: DC | PRN
  Administered 2020-11-08: 4 mg via INTRAVENOUS
  Filled 2020-11-08: qty 1

## 2020-11-08 MED ORDER — LABETALOL HCL 5 MG/ML IV SOLN: 10.0000 mg | INTRAVENOUS | Status: DC | PRN

## 2020-11-08 MED ORDER — HEPARIN SODIUM (PORCINE) 1000 UNIT/ML IJ SOLN
INTRAMUSCULAR | Status: AC
  Filled 2020-11-08: qty 1

## 2020-11-08 MED ORDER — HEPARIN SODIUM (PORCINE) 1000 UNIT/ML IJ SOLN
INTRAMUSCULAR | Status: DC | PRN
  Administered 2020-11-08: 3200 [IU] via INTRAVENOUS

## 2020-11-08 MED ORDER — CEFAZOLIN SODIUM-DEXTROSE 2-3 GM-%(50ML) IV SOLR
INTRAVENOUS | Status: DC | PRN
  Administered 2020-11-08: 2 g via INTRAVENOUS

## 2020-11-08 MED ORDER — MIDAZOLAM HCL 2 MG/2ML IJ SOLN
INTRAMUSCULAR | Status: AC
  Filled 2020-11-08: qty 2

## 2020-11-08 MED ORDER — CLONAZEPAM 0.5 MG PO TABS
1.0000 mg | ORAL_TABLET | Freq: Every day | ORAL | Status: DC
  Administered 2020-11-08 – 2020-11-09 (×2): 1 mg via ORAL
  Filled 2020-11-08 (×2): qty 2

## 2020-11-08 MED ORDER — CHLORHEXIDINE GLUCONATE 0.12 % MT SOLN
15.0000 mL | OROMUCOSAL | Status: AC
  Filled 2020-11-08: qty 15

## 2020-11-08 MED ORDER — LIDOCAINE-EPINEPHRINE (PF) 1 %-1:200000 IJ SOLN
INTRAMUSCULAR | Status: AC
  Filled 2020-11-08: qty 30

## 2020-11-08 MED ORDER — AMISULPRIDE (ANTIEMETIC) 5 MG/2ML IV SOLN: 10.0000 mg | Freq: Once | INTRAVENOUS | Status: DC | PRN

## 2020-11-08 MED ORDER — BUSPIRONE HCL 5 MG PO TABS
5.0000 mg | ORAL_TABLET | Freq: Every day | ORAL | Status: DC | PRN
  Filled 2020-11-08: qty 1

## 2020-11-08 MED ORDER — FENTANYL CITRATE (PF) 100 MCG/2ML IJ SOLN
25.0000 ug | INTRAMUSCULAR | Status: DC | PRN
  Administered 2020-11-08 (×2): 25 ug via INTRAVENOUS

## 2020-11-08 MED ORDER — PHENYLEPHRINE 40 MCG/ML (10ML) SYRINGE FOR IV PUSH (FOR BLOOD PRESSURE SUPPORT)
PREFILLED_SYRINGE | INTRAVENOUS | Status: DC | PRN
  Administered 2020-11-08: 160 ug via INTRAVENOUS
  Administered 2020-11-08 (×3): 80 ug via INTRAVENOUS

## 2020-11-08 MED ORDER — CEFAZOLIN SODIUM-DEXTROSE 2-4 GM/100ML-% IV SOLN
INTRAVENOUS | Status: AC
  Filled 2020-11-08: qty 100

## 2020-11-08 MED ORDER — CHLORHEXIDINE GLUCONATE 0.12 % MT SOLN
OROMUCOSAL | Status: AC
  Filled 2020-11-08: qty 15

## 2020-11-08 MED ORDER — DEXTROSE 5 % IV SOLN: INTRAVENOUS | Status: DC | PRN

## 2020-11-08 MED ORDER — SODIUM CHLORIDE 0.9 % IV SOLN
INTRAVENOUS | Status: AC
  Filled 2020-11-08: qty 1.2

## 2020-11-08 SURGICAL SUPPLY — 54 items
ARMBAND PINK RESTRICT EXTREMIT (MISCELLANEOUS) ×8 IMPLANT
BAG DECANTER FOR FLEXI CONT (MISCELLANEOUS) ×4 IMPLANT
BENZOIN TINCTURE PRP APPL 2/3 (GAUZE/BANDAGES/DRESSINGS) ×4 IMPLANT
BIOPATCH RED 1 DISK 7.0 (GAUZE/BANDAGES/DRESSINGS) ×4 IMPLANT
CANISTER SUCT 3000ML PPV (MISCELLANEOUS) ×4 IMPLANT
CATH PALINDROME-P 19CM W/VT (CATHETERS) ×4 IMPLANT
CATH PALINDROME-P 23CM W/VT (CATHETERS) IMPLANT
CATH PALINDROME-P 28CM W/VT (CATHETERS) IMPLANT
CHLORAPREP W/TINT 26 (MISCELLANEOUS) ×4 IMPLANT
CLIP VESOCCLUDE MED 6/CT (CLIP) ×4 IMPLANT
CLIP VESOCCLUDE SM WIDE 6/CT (CLIP) ×4 IMPLANT
COVER PROBE W GEL 5X96 (DRAPES) ×4 IMPLANT
COVER SURGICAL LIGHT HANDLE (MISCELLANEOUS) ×4 IMPLANT
COVER WAND RF STERILE (DRAPES) IMPLANT
DERMABOND ADVANCED (GAUZE/BANDAGES/DRESSINGS) ×1
DERMABOND ADVANCED .7 DNX12 (GAUZE/BANDAGES/DRESSINGS) ×3 IMPLANT
DRAPE C-ARM 42X72 X-RAY (DRAPES) ×4 IMPLANT
DRAPE CHEST BREAST 15X10 FENES (DRAPES) ×4 IMPLANT
ELECT REM PT RETURN 9FT ADLT (ELECTROSURGICAL) ×4
ELECTRODE REM PT RTRN 9FT ADLT (ELECTROSURGICAL) ×3 IMPLANT
GAUZE 4X4 16PLY RFD (DISPOSABLE) ×4 IMPLANT
GAUZE SPONGE 4X4 12PLY STRL (GAUZE/BANDAGES/DRESSINGS) ×4 IMPLANT
GLOVE SURG SS PI 8.0 STRL IVOR (GLOVE) ×4 IMPLANT
GOWN STRL REUS W/ TWL LRG LVL3 (GOWN DISPOSABLE) ×6 IMPLANT
GOWN STRL REUS W/ TWL XL LVL3 (GOWN DISPOSABLE) ×3 IMPLANT
GOWN STRL REUS W/TWL LRG LVL3 (GOWN DISPOSABLE) ×8
GOWN STRL REUS W/TWL XL LVL3 (GOWN DISPOSABLE) ×4
INSERT FOGARTY SM (MISCELLANEOUS) ×4 IMPLANT
KIT BASIN OR (CUSTOM PROCEDURE TRAY) ×4 IMPLANT
KIT PALINDROME-P 55CM (CATHETERS) IMPLANT
KIT TURNOVER KIT B (KITS) ×4 IMPLANT
NEEDLE 18GX1X1/2 (RX/OR ONLY) (NEEDLE) ×4 IMPLANT
NEEDLE HYPO 25GX1X1/2 BEV (NEEDLE) ×4 IMPLANT
NS IRRIG 1000ML POUR BTL (IV SOLUTION) ×4 IMPLANT
PACK CV ACCESS (CUSTOM PROCEDURE TRAY) ×4 IMPLANT
PACK SURGICAL SETUP 50X90 (CUSTOM PROCEDURE TRAY) ×4 IMPLANT
PAD ARMBOARD 7.5X6 YLW CONV (MISCELLANEOUS) ×8 IMPLANT
SET MICROPUNCTURE 5F STIFF (MISCELLANEOUS) ×4 IMPLANT
SOAP 2 % CHG 4 OZ (WOUND CARE) ×4 IMPLANT
STRIP CLOSURE SKIN 1/2X4 (GAUZE/BANDAGES/DRESSINGS) ×4 IMPLANT
SUT ETHILON 3 0 PS 1 (SUTURE) ×4 IMPLANT
SUT MNCRL AB 4-0 PS2 18 (SUTURE) ×4 IMPLANT
SUT PROLENE 6 0 BV (SUTURE) ×4 IMPLANT
SUT VIC AB 3-0 SH 27 (SUTURE) ×4
SUT VIC AB 3-0 SH 27X BRD (SUTURE) ×3 IMPLANT
SYR 10ML LL (SYRINGE) ×4 IMPLANT
SYR 20CC LL (SYRINGE) ×4 IMPLANT
SYR 20ML LL LF (SYRINGE) ×8 IMPLANT
SYR 5ML LL (SYRINGE) ×4 IMPLANT
SYR CONTROL 10ML LL (SYRINGE) ×4 IMPLANT
TOWEL GREEN STERILE (TOWEL DISPOSABLE) ×4 IMPLANT
TOWEL GREEN STERILE FF (TOWEL DISPOSABLE) ×8 IMPLANT
UNDERPAD 30X36 HEAVY ABSORB (UNDERPADS AND DIAPERS) ×4 IMPLANT
WATER STERILE IRR 1000ML POUR (IV SOLUTION) ×4 IMPLANT

## 2020-11-08 NOTE — Progress Notes (Signed)
Dr. Valma Cava made aware of potassium 6.3.

## 2020-11-08 NOTE — Progress Notes (Addendum)
Patient next HD will be at [facility] clinic[facility] at his regularly scheduled seat time of 10:20am tomorrow, Saturday 11/09/20. Navigator attempted to contact patient on his cell phone, but no answer-message left.  Alphonzo Cruise, [facility] Navigator [phone]

## 2020-11-08 NOTE — Progress Notes (Signed)
Attempted to arrange ride for patient for Saturday morning (11/09/20).  Left message for [facility] transport at [phone] with no return phone call.  Contacted [facility] and they have no rides available for Saturday that can accommodate patient's electric wheelchair.  Contacted [facility] and they require a 24 hour notice, so no ride available on Saturday.  Contacted the [facility] on duty and he suggested involving case management.  Referral/order for TOC placed in Epic.  Dr. Luan Pulling notified of situation and stated he will round on patient first thing in the morning.  Due to difficulty in arranging transportation and with the forecast for inclement weather patient may need in house dialysis.  Will defer to admitting provider.

## 2020-11-08 NOTE — H&P (Signed)
ASSESSMENT & PLAN:  55 y.o. male with ESRD on HD. He has failed multiple bilateral upper extremity access. He recently underwent right upper extremity AVG to very proximal axillary vein 06/12/20. This has unfortunately thrombosed. 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 RUE HeRO graft 11/08/19 has thrombosed. I counseled him that his risk of recurrent thrombosis is high. He reports he is traveling outside the country [place]. I recommended removal of HeRO graft venous component and replacement of tunneled dialysis catheter for HD access.   CHIEF COMPLAINT:   Needs HD access.  HISTORY:  HISTORY OF PRESENT ILLNESS: [name] [name] is a 55 y.o. male who presents to clinic for evaluation of new HD access.  He has failed multiple accesses in the past.  He has no usable upper extremity vein.  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  The right central axillary vein appeared patent.  This was followed by right upper extremity brachial axillary AV graft 06/12/2020.  This is unfortunately thrombosed.  He returns the clinic looking for new access.  He is currently dialyzing through a right IJ [facility]  Past Medical History:  Diagnosis Date  . Allergic rhinitis   . Allergy   . Anemia   . Aortic insufficiency    SBE in 2016 >> pt declined AVR // a. Echo 7/16:  Mod LVH, EF 50-55%, mild to mod AI, severe LAE, PASP 57 mmHg (LV ID end diastolic 00.3 mm);  b. Echo 2/17:  Mod LVH, EF 50-55%, mod AI, MAC, mild MR, mod LAE, mild to mod TR, PASP 63 mmHg // Echo 2/21: normal EF, mild MR, mild AI  . Atrial fibrillation and flutter ([place])    chronic anticoag with Warfarin // bradycardia noted during admit 10/21  . Bilateral carpal tunnel syndrome   . Bradycardia    brady/low BP event during admit in 10/21 >> poor candidate for pacer/EPS due to high risk of infection with ESRD; pacer not recommended by EP   . CAD (coronary artery disease)    s/p NSTEMI 10/21: cath>> pLAD 99 >> tx with DES; Lat RI 100 and OM1 100 - med Rx  . Cataract    removed both eyes   . Cholecystitis    dx during admx 10/21 >> HIDA neg // exam benign // high risk for surgery >> no surgical intervention recommended.   . Claustrophobia   . COPD (chronic obstructive pulmonary disease) ([place])   . Coronary artery disease   . Diabetes mellitus    Type II  . Diabetic peripheral neuropathy ([place])   . Diabetic retinopathy ([place])   . Discitis of lumbar region    resolved  . DVT (deep venous thrombosis) ([place]) 10/07/2015   LLE  . Dysrhythmia 07/2020   afib  . Endocarditis 11/28/14  . ESRD (end stage renal disease) on dialysis [facility])    s/p renal transplant in 2009, failed in 2016  . Gait disorder   . Gastroparesis   . GERD (gastroesophageal reflux disease)   . Gout   . Hearing difficulty   . Hemodialysis patient ([place])    tuesday, thursday, saturday   . HFrEF (HF with reduced EF)    Ischemic CM // Echo 10/21: EF 40-45, dist sept and apical AK, mild LVH, normal RVSF, mod MAC  . Hyperlipidemia   . Hypertension   . Incomplete bladder emptying   . Leg pain  02/05/11   with walking  . Morbid obesity ([place])   . Myocardial infarction ([place]) 07/2020  . Obstructive sleep apnea    not using CPAP- lost 100lbs  . Osteomyelitis ([place])   . Posttraumatic stress disorder   . Rotator cuff disorder   . Secondary hyperparathyroidism ([place])   . Sepsis ([place])    Postive blood cultures -   . Sleep apnea    no cpap  . Urethral stricture   . Vitamin D deficiency     Past Surgical History:  Procedure Laterality Date  . ARTERIOVENOUS GRAFT PLACEMENT Bilateral "several"  . AV FISTULA PLACEMENT Bilateral 2015  . AV FISTULA PLACEMENT Right 06/13/2019   Procedure: INSERTION OF ARTERIOVENOUS (AV) GORE-TEX GRAFT RIGHT  ARM;  Surgeon: Elam Dutch, MD;  Location: [facility];  Service: Vascular;  Laterality: Right;  . BASCILIC VEIN TRANSPOSITION  Right 09/27/2015  . [place] Right 09/27/2015   Procedure: BASILIC VEIN TRANSPOSITION right;  Surgeon: Mal Misty, MD;  Location: [facility];  Service: Vascular;  Laterality: Right;  . CARDIAC CATHETERIZATION  07/2020  . CATARACT EXTRACTION W/ INTRAOCULAR LENS  IMPLANT, BILATERAL Bilateral   . CORONARY STENT INTERVENTION N/A 07/27/2020   Procedure: CORONARY STENT INTERVENTION;  Surgeon: Burnell Blanks, MD;  Location: [facility];  Service: Cardiovascular;  Laterality: N/A;  . CYSTOSCOPY W/ INTERNAL URETHROTOMY  09/2014  . EYE SURGERY Bilateral 2006  . IR THROMBECTOMY AV FISTULA W/THROMBOLYSIS/PTA INC/SHUNT/IMG RIGHT Right 08/01/2019  . IR US GUIDE VASC ACCESS RIGHT  08/01/2019  . KIDNEY TRANSPLANT  2009  . LEFT HEART CATH AND CORONARY ANGIOGRAPHY N/A 07/27/2020   Procedure: LEFT HEART CATH AND CORONARY ANGIOGRAPHY;  Surgeon: Burnell Blanks, MD;  Location: [facility];  Service: Cardiovascular;  Laterality: N/A;  . SIGMOIDOSCOPY    . UPPER EXTREMITY VENOGRAPHY Bilateral 06/02/2019   Procedure: UPPER EXTREMITY VENOGRAPHY;  Surgeon: Elam Dutch, MD;  Location: [facility];  Service: Cardiovascular;  Laterality: Bilateral;  . UPPER GASTROINTESTINAL ENDOSCOPY    . VASCULAR ACCESS DEVICE INSERTION Right 11/07/2020   Procedure: RIGHT UPPER EXTREMITY HERO GRAFT;  Surgeon: [name], MD;  Location: [facility];  Service: Vascular;  Laterality: Right;    Family History  Problem Relation Age of Onset  . Hypertension Mother   . Diabetes Mother   . Diabetes Father   . Diabetes Brother   . Colon cancer Neg Hx   . Colon polyps Neg Hx   . Esophageal cancer Neg Hx   . Rectal cancer Neg Hx   . Stomach cancer Neg Hx     Social History   Socioeconomic History  . Marital status: Divorced    Spouse name: Not on file  . Number of children: 0  . Years of education: College   . Highest education level: Bachelor's degree (e.g., BA, AB, BS)   Occupational History    Employer: UNEMPLOYED  Tobacco Use  . Smoking status: Never Smoker  . Smokeless tobacco: Never Used  Vaping Use  . Vaping Use: Never used  Substance and Sexual Activity  . Alcohol use: No    Alcohol/week: 0.0 standard drinks  . Drug use: No  . Sexual activity: Not Currently  Other Topics Concern  . Not on file  Social History Narrative   Patient is Divorced.   Patient does not have any children.   Patient is right-handed   Patient is currently in college working on his Ph.D.  Social Determinants of Health   Financial Resource Strain: Low Risk   . Difficulty of Paying Living Expenses: Not very hard  Food Insecurity: No Food Insecurity  . Worried About Charity fundraiser in the Last Year: Never true  . Ran Out of Food in the Last Year: Never true  Transportation Needs: No Transportation Needs  . Lack of Transportation (Medical): No  . Lack of Transportation (Non-Medical): No  Physical Activity: Inactive  . Days of Exercise per Week: 0 days  . Minutes of Exercise per Session: 0 min  Stress: Stress Concern Present  . Feeling of Stress : Very much  Social Connections: Moderately Isolated  . Frequency of Communication with Friends and Family: More than three times a week  . Frequency of Social Gatherings with Friends and Family: More than three times a week  . Attends Religious Services: 1 to 4 times per year  . Active Member of Clubs or Organizations: No  . Attends Archivist Meetings: Never  . Marital Status: Divorced  Human resources officer Violence: Not At Risk  . Fear of Current or Ex-Partner: No  . Emotionally Abused: No  . Physically Abused: No  . Sexually Abused: No    Allergies  Allergen Reactions  . Pork-Derived Products Anaphylaxis and Other (See Comments)    Fever also   . Ace Inhibitors Cough  . Hydrocodone Other (See Comments)    Hallucinations  . Pork Allergy     Other reaction(s): Unknown    Current  Facility-Administered Medications  Medication Dose Route Frequency Provider Last Rate Last Admin  . 0.9 %  sodium chloride infusion   Intravenous Continuous Barnet Glasgow, MD      . chlorhexidine (PERIDEX) 0.12 % solution 15 mL  15 mL Mouth/Throat NOW Houser, Stephen A, MD      . chlorhexidine (PERIDEX) 0.12 % solution           . fentaNYL (SUBLIMAZE) 100 MCG/2ML injection           . fentaNYL (SUBLIMAZE) injection 50 mcg  50 mcg Intravenous Once Barnet Glasgow, MD        REVIEW OF SYSTEMS:  _0  denotes positive finding, _1  denotes negative finding Cardiac  Comments:  Chest pain or chest pressure:    Shortness of breath upon exertion:    Short of breath when lying flat:    Irregular heart rhythm:        Vascular    Pain in calf, thigh, or hip brought on by ambulation:    Pain in feet at night that wakes you up from your sleep:     Blood clot in your veins:    Leg swelling:         Pulmonary    Oxygen at home:    Productive cough:     Wheezing:         Neurologic    Sudden weakness in arms or legs:     Sudden numbness in arms or legs:     Sudden onset of difficulty speaking or slurred speech:    Temporary loss of vision in one eye:     Problems with dizziness:         Gastrointestinal    Blood in stool:     Vomited blood:         Genitourinary    Burning when urinating:     Blood in urine:        Psychiatric  Major depression:         Hematologic    Bleeding problems:    Problems with blood clotting too easily:        Skin    Rashes or ulcers:        Constitutional    Fever or chills:     PHYSICAL EXAM:   Vitals:   11/08/20 1146  BP: 113/62  Pulse: 84  Resp: 18  Temp: (!) 97.4 F (36.3 C)  TempSrc: Oral  SpO2: 99%  Weight: 129 kg  Height: _0  (1.778 m)   Morbidly obese.  Confined to wheelchair. Unlabored. 2+ radial pulse bilaterally. Thrombosed right upper extremity AV graft RUE HeRO graft thrombosed.  DATA REVIEW:    Most  recent CBC CBC Latest Ref Rng & Units 11/08/2020 11/07/2020 08/04/2020  WBC 4.0 - 10.5 K/uL - - 6.7  Hemoglobin 13.0 - 17.0 g/dL 10.2(L) 11.9(L) 10.3(L)  Hematocrit 39.0 - 52.0 % 30.0(L) 35.0(L) 34.2(L)  Platelets 150 - 400 K/uL - - 280     Most recent CMP CMP Latest Ref Rng & Units 11/08/2020 11/07/2020 08/19/2020  Glucose 70 - 99 mg/dL 183(H) 136(H) 137(H)  BUN 6 - 20 mg/dL 75(H) 56(H) -  Creatinine 0.61 - 1.24 mg/dL 12.50(H) 10.20(H) -  Sodium 135 - 145 mmol/L 134(L) 138 -  Potassium 3.5 - 5.1 mmol/L 6.3(HH) 5.3(H) -  Chloride 98 - 111 mmol/L 104 101 -  CO2 22 - 32 mmol/L - - -  Calcium 8.9 - 10.3 mg/dL - - -  Total Protein 6.5 - 8.1 g/dL - - -  Total Bilirubin 0.3 - 1.2 mg/dL - - -  Alkaline Phos 38 - 126 U/L - - -  AST 15 - 41 U/L - - -  ALT 0 - 44 U/L - - -    Renal function Estimated Creatinine Clearance: 8.9 mL/min (A) (by C-G formula based on SCr of 12.5 mg/dL (H)).  Hgb A1c MFr Bld (%)  Date Value  08/19/2020 7.2 (H)    LDL Cholesterol  Date Value Ref Range Status  05/13/2020 43 0 - 99 mg/dL Final      Yevonne Aline. Stanford Breed, MD [facility] Phone Number: [phone] 11/08/2020 1:20 PM

## 2020-11-08 NOTE — Progress Notes (Signed)
In attempts to arrange a ride home for patient the following were contacted.   Friend ([facility]) he cannot accommodate patient's wheelchair.   Friend Maximino Greenland) is currently in [place], [place].  [facility]- did not have any available reservations for the night.  [facility]- does not accommodate wheelchairs.  [phone]- Called and left VM, with no return phone call.  [facility]- did not have any available wheelchair reservations.  Dr. Stanford Breed made aware and patient will be admitted for observation.

## 2020-11-08 NOTE — Op Note (Signed)
DATE OF SERVICE: 11/08/2020  PATIENT:  [name] [name]  55 y.o. male  PRE-OPERATIVE DIAGNOSIS:  Thrombosed RUE HeRO graft  POST-OPERATIVE DIAGNOSIS:  Same  PROCEDURE:   1) Excision of venous component RUE HeRO graft 2) Placement of RIJ tunneled dialysis catheter  SURGEON:  Surgeon(s) and Role:    * [name], MD - Primary  ASSISTANT: none  ANESTHESIA:   general  EBL: 29mL  BLOOD ADMINISTERED:none  DRAINS: none   LOCAL MEDICATIONS USED:  NONE  SPECIMEN:  none  COUNTS: confirmed correct .  TOURNIQUET:  None  PATIENT DISPOSITION:  PACU - hemodynamically stable.   Delay start of Pharmacological VTE agent (>24hrs) due to surgical blood loss or risk of bleeding: no  INDICATION FOR PROCEDURE: [name] is a 56 y.o. male with ESRD. I performed a HeRO graft for him yesterday with an early access graft. This unfortunately thrombosed. After careful discussion of risks, benefits, and alternatives the patient was offered removal of the venous outflow sheath and placement of new tunneled dialysis catheter. The patient understood and wished to proceed.  OPERATIVE FINDINGS: Thrombosed HeRO graft. Successful placement of dialysis catheter.  DESCRIPTION OF PROCEDURE: After identification of the patient in the pre-operative holding area, the patient was transferred to the operating room. The patient was positioned supine on the operating room table. Anesthesia was induced. The right chest and neck were prepped and draped in standard fashion. A surgical pause was performed confirming correct patient, procedure, and operative location.  I reopened counterincisions over the neck and deltopectoral groove to expose the venous component of the hero graft.  I secured the central aspect of the graft with a right angle clamp.  I transected the venous catheter.  No flow was noted in the graft.  Introduced a 035 J-wire through the central aspect of the graft into the SVC.  The entire venous  outflow catheter was removed.  The anastomosis with the AcuSeal graft was identified and oversewn with a 6-0 Prolene.  A peel-away sheath was introduced into the superior vena cava under fluoroscopic guidance.  A counterincision was made in the chest under the clavicle.  A 19 cm tunnel dialysis catheter was then tunneled under the skin, over the clavicle into the incision in the neck.  The tunneling device was removed and the catheter fed through the peel-away sheath into the superior vena cava.  The peel-away sheath was removed and the catheter gently pulled back.  Adequate position was confirmed with x-ray.  The catheter was tested and found to flush and draw back well.  Catheter was heparin locked.  Caps were applied.  Catheter was sutured to the skin.  The incisions were closed with 3-0 Vicryl and 4-0 Monocryl.  Upon completion of the case instrument and sharps counts were confirmed correct. The patient was transferred to the PACU in good condition. I was present for all portions of the procedure.  Yevonne Aline. Stanford Breed, MD [name] Phone Number: [phone] 11/08/2020 4:31 PM

## 2020-11-08 NOTE — Discharge Instructions (Signed)
   [facility]  Discharge Instructions  AV Fistula or Graft Surgery for Dialysis Access  Please refer to the following instructions for your post-procedure care. Your surgeon or physician assistant will discuss any changes with you.  Activity  You may drive the day following your surgery, if you are comfortable and no longer taking prescription pain medication. Resume full activity as the soreness in your incision resolves.  Bathing/Showering  You may shower after you go home. Keep your incision dry for 48 hours. Do not soak in a bathtub, hot tub, or swim until the incision heals completely. You may not shower if you have a hemodialysis catheter.  Incision Care  Clean your incision with mild soap and water after 48 hours. Pat the area dry with a clean towel. You do not need a bandage unless otherwise instructed. Do not apply any ointments or creams to your incision. You may have skin glue on your incision. Do not peel it off. It will come off on its own in about one week. Your arm may swell a bit after surgery. To reduce swelling use pillows to elevate your arm so it is above your heart. Your doctor will tell you if you need to lightly wrap your arm with an ACE bandage.  Diet  Resume your normal diet. There are not special food restrictions following this procedure. In order to heal from your surgery, it is CRITICAL to get adequate nutrition. Your body requires vitamins, minerals, and protein. Vegetables are the best source of vitamins and minerals. Vegetables also provide the perfect balance of protein. Processed food has little nutritional value, so try to avoid this.  Medications  Resume taking all of your medications. If your incision is causing pain, you may take over-the counter pain relievers such as acetaminophen (Tylenol). If you were prescribed a stronger pain medication, please be aware these medications can cause nausea and constipation. Prevent  nausea by taking the medication with a snack or meal. Avoid constipation by drinking plenty of fluids and eating foods with high amount of fiber, such as fruits, vegetables, and grains.  Do not take Tylenol if you are taking prescription pain medications.  Follow up Your surgeon may want to see you in the office following your access surgery. If so, this will be arranged at the time of your surgery.  Please call us immediately for any of the following conditions:  . Increased pain, redness, drainage (pus) from your incision site . Fever of 101 degrees or higher . Severe or worsening pain at your incision site . Hand pain or numbness. .  Reduce your risk of vascular disease:  . Stop smoking. If you would like help, call [facility] at 1-800-QUIT-NOW (979)317-9954) or [place] at 404-172-7163  . Manage your cholesterol . Maintain a desired weight . Control your diabetes . Keep your blood pressure down  Dialysis  It will take several weeks to several months for your new dialysis access to be ready for use. Your surgeon will determine when it is okay to use it. Your nephrologist will continue to direct your dialysis. You can continue to use your Permcath until your new access is ready for use.   11/08/2020 [name] [name] [id number] June 01, 1966  Surgeon(s): [name], MD  Procedure(s): THROMBECTOMY ARTERIOVENOUS GORE-TEX GRAFT INSERTION OF DIALYSIS CATHETER REMOVAL OF HeRO GRAFT   If you have any questions, please call the office at [phone].

## 2020-11-08 NOTE — Transfer of Care (Signed)
Immediate Anesthesia Transfer of Care Note  Patient: Drury Loyer  Procedure(s) Performed: INSERTION OF DIALYSIS CATHETER (N/A ) Excision of Foreign Body (Right Chest)  Patient Location: PACU  Anesthesia Type:General  Level of Consciousness: drowsy and patient cooperative  Airway & Oxygen Therapy: Patient Spontanous Breathing and Patient connected to nasal cannula oxygen  Post-op Assessment: Report given to RN, Post -op Vital signs reviewed and stable and Patient moving all extremities  Post vital signs: Reviewed and stable  Last Vitals:  Vitals Value Taken Time  BP    Temp    Pulse    Resp    SpO2      Last Pain:  Vitals:   11/08/20 1214  TempSrc:   PainSc: 0-No pain         Complications: No complications documented.

## 2020-11-08 NOTE — Anesthesia Postprocedure Evaluation (Signed)
Anesthesia Post Note  Patient: [name] [name]  Procedure(s) Performed: INSERTION OF DIALYSIS CATHETER (N/A ) Excision of Foreign Body (Right Chest)     Patient location during evaluation: PACU Anesthesia Type: General Level of consciousness: awake and alert Pain management: pain level controlled Vital Signs Assessment: post-procedure vital signs reviewed and stable Respiratory status: spontaneous breathing, nonlabored ventilation, respiratory function stable and patient connected to nasal cannula oxygen Cardiovascular status: blood pressure returned to baseline and stable Postop Assessment: no apparent nausea or vomiting Anesthetic complications: no   No complications documented.  Last Vitals:  Vitals:   11/08/20 1146 11/08/20 1640  BP: 113/62 110/65  Pulse: 84 80  Resp: 18 15  Temp: (!) 36.3 C 36.6 C  SpO2: 99% 98%    Last Pain:  Vitals:   11/08/20 1640  TempSrc:   PainSc: 10-Worst pain ever                 Barnet Glasgow

## 2020-11-08 NOTE — Anesthesia Preprocedure Evaluation (Signed)
Anesthesia Evaluation  Patient identified by MRN, date of birth, ID band Patient awake    Reviewed: Allergy & Precautions, Patient's Chart, lab work & pertinent test results  Airway Mallampati: III  TM Distance: >3 FB Neck ROM: Full    Dental no notable dental hx. (+) Teeth Intact   Pulmonary sleep apnea , COPD,    Pulmonary exam normal breath sounds clear to auscultation       Cardiovascular hypertension, + CAD and + Past MI  Normal cardiovascular exam Rhythm:Regular Rate:Normal     Neuro/Psych Anxiety    GI/Hepatic GERD  ,  Endo/Other  diabetes  Renal/GU Renal disease     Musculoskeletal  (+) Arthritis ,   Abdominal   Peds  Hematology  (+) anemia ,   Anesthesia Other Findings   Reproductive/Obstetrics                             Anesthesia Physical Anesthesia Plan  ASA: III and emergent  Anesthesia Plan: General   Post-op Pain Management:    Induction: Intravenous  PONV Risk Score and Plan: 3 and Treatment may vary due to age or medical condition, Ondansetron, Dexamethasone and Midazolam  Airway Management Planned: LMA  Additional Equipment: None  Intra-op Plan:   Post-operative Plan:   Informed Consent: I have reviewed the patients History and Physical, chart, labs and discussed the procedure including the risks, benefits and alternatives for the proposed anesthesia with the patient or authorized representative who has indicated his/her understanding and acceptance.     Dental advisory given  Plan Discussed with: CRNA  Anesthesia Plan Comments:         Anesthesia Quick Evaluation

## 2020-11-08 NOTE — Anesthesia Procedure Notes (Signed)
Procedure Name: LMA Insertion Date/Time: 11/08/2020 3:41 PM Performed by: Moshe Salisbury, CRNA Pre-anesthesia Checklist: Patient identified, Emergency Drugs available, Suction available and Patient being monitored Patient Re-evaluated:Patient Re-evaluated prior to induction Oxygen Delivery Method: Circle System Utilized Preoxygenation: Pre-oxygenation with 100% oxygen Induction Type: IV induction Ventilation: Mask ventilation without difficulty LMA: LMA inserted and LMA with gastric port inserted LMA Size: 5.0 Number of attempts: 1 Placement Confirmation: positive ETCO2 Tube secured with: Tape Dental Injury: Teeth and Oropharynx as per pre-operative assessment

## 2020-11-09 ENCOUNTER — Encounter (HOSPITAL_COMMUNITY): Payer: Self-pay | Admitting: Vascular Surgery

## 2020-11-09 DIAGNOSIS — I358 Other nonrheumatic aortic valve disorders: Secondary | ICD-10-CM | POA: Diagnosis not present

## 2020-11-09 DIAGNOSIS — I4891 Unspecified atrial fibrillation: Secondary | ICD-10-CM | POA: Diagnosis not present

## 2020-11-09 DIAGNOSIS — E1142 Type 2 diabetes mellitus with diabetic polyneuropathy: Secondary | ICD-10-CM | POA: Diagnosis not present

## 2020-11-09 DIAGNOSIS — E1140 Type 2 diabetes mellitus with diabetic neuropathy, unspecified: Secondary | ICD-10-CM | POA: Diagnosis not present

## 2020-11-09 DIAGNOSIS — T8241XA Breakdown (mechanical) of vascular dialysis catheter, initial encounter: Secondary | ICD-10-CM | POA: Diagnosis not present

## 2020-11-09 DIAGNOSIS — M4627 Osteomyelitis of vertebra, lumbosacral region: Secondary | ICD-10-CM | POA: Diagnosis not present

## 2020-11-09 DIAGNOSIS — I2510 Atherosclerotic heart disease of native coronary artery without angina pectoris: Secondary | ICD-10-CM | POA: Diagnosis not present

## 2020-11-09 DIAGNOSIS — N186 End stage renal disease: Secondary | ICD-10-CM | POA: Diagnosis not present

## 2020-11-09 DIAGNOSIS — Z7901 Long term (current) use of anticoagulants: Secondary | ICD-10-CM | POA: Diagnosis not present

## 2020-11-09 DIAGNOSIS — Z992 Dependence on renal dialysis: Secondary | ICD-10-CM | POA: Diagnosis not present

## 2020-11-09 DIAGNOSIS — J449 Chronic obstructive pulmonary disease, unspecified: Secondary | ICD-10-CM | POA: Diagnosis not present

## 2020-11-09 DIAGNOSIS — I38 Endocarditis, valve unspecified: Secondary | ICD-10-CM | POA: Diagnosis not present

## 2020-11-09 DIAGNOSIS — Z79899 Other long term (current) drug therapy: Secondary | ICD-10-CM | POA: Diagnosis not present

## 2020-11-09 DIAGNOSIS — R2681 Unsteadiness on feet: Secondary | ICD-10-CM | POA: Diagnosis not present

## 2020-11-09 LAB — CBC
HCT: 30.1 % — ABNORMAL LOW (ref 39.0–52.0)
Hemoglobin: 8.8 g/dL — ABNORMAL LOW (ref 13.0–17.0)
MCH: 30 pg (ref 26.0–34.0)
MCHC: 29.2 g/dL — ABNORMAL LOW (ref 30.0–36.0)
MCV: 102.7 fL — ABNORMAL HIGH (ref 80.0–100.0)
Platelets: 196 10*3/uL (ref 150–400)
RBC: 2.93 MIL/uL — ABNORMAL LOW (ref 4.22–5.81)
RDW: 18.6 % — ABNORMAL HIGH (ref 11.5–15.5)
WBC: 6.7 10*3/uL (ref 4.0–10.5)
nRBC: 0 % (ref 0.0–0.2)

## 2020-11-09 LAB — HEMOGLOBIN A1C
Hgb A1c MFr Bld: 6.7 % — ABNORMAL HIGH (ref 4.8–5.6)
Mean Plasma Glucose: 145.59 mg/dL

## 2020-11-09 LAB — GLUCOSE, CAPILLARY
Glucose-Capillary: 117 mg/dL — ABNORMAL HIGH (ref 70–99)
Glucose-Capillary: 121 mg/dL — ABNORMAL HIGH (ref 70–99)
Glucose-Capillary: 147 mg/dL — ABNORMAL HIGH (ref 70–99)

## 2020-11-09 LAB — POTASSIUM: Potassium: 6.5 mmol/L (ref 3.5–5.1)

## 2020-11-09 LAB — CREATININE, SERUM
Creatinine, Ser: 13.25 mg/dL — ABNORMAL HIGH (ref 0.61–1.24)
GFR, Estimated: 4 mL/min — ABNORMAL LOW (ref >60)

## 2020-11-09 LAB — HIV ANTIBODY (ROUTINE TESTING W REFLEX): HIV Screen 4th Generation wRfx: NONREACTIVE

## 2020-11-09 MED ORDER — SODIUM CHLORIDE 0.9 % IV SOLN: 100.0000 mL | INTRAVENOUS | Status: DC | PRN

## 2020-11-09 MED ORDER — LIDOCAINE-PRILOCAINE 2.5-2.5 % EX CREA: 1.0000 "application " | TOPICAL_CREAM | CUTANEOUS | Status: DC | PRN

## 2020-11-09 MED ORDER — ALTEPLASE 2 MG IJ SOLR: 2.0000 mg | Freq: Once | INTRAMUSCULAR | Status: DC | PRN

## 2020-11-09 MED ORDER — PENTAFLUOROPROP-TETRAFLUOROETH EX AERO: 1.0000 "application " | INHALATION_SPRAY | CUTANEOUS | Status: DC | PRN

## 2020-11-09 MED ORDER — ANTICOAGULANT SODIUM CITRATE 4% (200MG/5ML) IV SOLN
5.0000 mL | Status: AC
  Administered 2020-11-09: 5 mL
  Filled 2020-11-09: qty 5

## 2020-11-09 MED ORDER — CHLORHEXIDINE GLUCONATE CLOTH 2 % EX PADS
6.0000 | MEDICATED_PAD | Freq: Every day | CUTANEOUS | Status: DC
  Administered 2020-11-09 – 2020-11-10 (×2): 6 via TOPICAL

## 2020-11-09 MED ORDER — SODIUM ZIRCONIUM CYCLOSILICATE 10 G PO PACK
10.0000 g | PACK | Freq: Three times a day (TID) | ORAL | Status: DC
  Administered 2020-11-09 – 2020-11-10 (×4): 10 g via ORAL
  Filled 2020-11-09 (×4): qty 1

## 2020-11-09 MED ORDER — LIDOCAINE HCL (PF) 1 % IJ SOLN: 5.0000 mL | INTRAMUSCULAR | Status: DC | PRN

## 2020-11-09 NOTE — Progress Notes (Addendum)
[facility] supervisior [facility] Emms called and stated that they would not be able to transport pt with wheelchair. That [place] that spoke with [name] CM was not a supervisior at [facility] and Marcie Bal is unsure why they would approve transport with a wheelchair. Attempted to arrange for [facility] transport to take pt home but they are not transporting tonight due to road conditions. Dr. Stanford Breed made aware pt would be here tonight. Carroll Kinds RN

## 2020-11-09 NOTE — Progress Notes (Signed)
   ASSESSMENT & PLAN:  [name] [name] is a 55 y.o. male status post RIJ TDC, partial removal of RUE HeRO graft. HD today as inpatient - discussed with Dr. Johnney Ou. OK for discharge after HD.  SUBJECTIVE:  No complaints.  OBJECTIVE:  BP (!) 112/30 (BP Location: Left Wrist)   Pulse 87   Temp 97.9 F (36.6 C) (Oral)   Resp 18   Ht 5\' 10"  (1.778 m)   Wt 129 kg   SpO2 98%   BMI 40.81 kg/m   Intake/Output Summary (Last 24 hours) at 11/09/2020 0953 Last data filed at 11/08/2020 2149 Gross per 24 hour  Intake 1010 ml  Output 75 ml  Net 935 ml    RIJ TDC without bleeding Ecchymosis about RUE from tunneling - unchanged. Incisions clean and dry with dermabond.  CBC Latest Ref Rng & Units 11/09/2020 11/08/2020 11/07/2020  WBC 4.0 - 10.5 K/uL 6.7 - -  Hemoglobin 13.0 - 17.0 g/dL 8.8(L) 10.2(L) 11.9(L)  Hematocrit 39.0 - 52.0 % 30.1(L) 30.0(L) 35.0(L)  Platelets 150 - 400 K/uL 196 - -     CMP Latest Ref Rng & Units 11/09/2020 11/08/2020 11/07/2020  Glucose 70 - 99 mg/dL - 183(H) 136(H)  BUN 6 - 20 mg/dL - 75(H) 56(H)  Creatinine 0.61 - 1.24 mg/dL 13.25(H) 12.50(H) 10.20(H)  Sodium 135 - 145 mmol/L - 134(L) 138  Potassium 3.5 - 5.1 mmol/L 6.5(HH) 6.3(HH) 5.3(H)  Chloride 98 - 111 mmol/L - 104 101  CO2 22 - 32 mmol/L - - -  Calcium 8.9 - 10.3 mg/dL - - -  Total Protein 6.5 - 8.1 g/dL - - -  Total Bilirubin 0.3 - 1.2 mg/dL - - -  Alkaline Phos 38 - 126 U/L - - -  AST 15 - 41 U/L - - -  ALT 0 - 44 U/L - - -    Estimated Creatinine Clearance: 8.4 mL/min (A) (by C-G formula based on SCr of 13.25 mg/dL (H)).  Yevonne Aline. Stanford Breed, MD [facility] Phone Number: [phone] 11/09/2020 9:53 AM

## 2020-11-09 NOTE — Progress Notes (Signed)
Nephrology note -   Pt admitted following conversion of HeRO AVG that was clotted to [facility].  Due to various barriers with transportation he was unable to go to outpt HD today and was placed in OBS following his procedure last night so he could have HD here today.    He has chronic hyperkalemia outpt reflected on labs here which is being medically managed with lokelma as he awaits dialysis.   He will have dialysis at some point today ASAP based on availability due to extremely high pt census.   2K dialysate, will try to achieve EDW 129kg per outpt orders.    He has a heparin allergy listed in his chart which I discussed -- he had a reaction to some pork based produced years ago in [place].  He has received heparin since then without issue.  His current TDC is packed with heparin and we will use the same following HD.   D/C after HD.   Call with issues.

## 2020-11-09 NOTE — Progress Notes (Signed)
Patient for DC home.  Peripheral IV access discontinued, cardiac monitoring discontinued.  MD made aware and asked to reorder these things if they wished, no orders received.  Patient wishes to remain without above.

## 2020-11-09 NOTE — TOC Progression Note (Signed)
Transition of Care [facility]) - Progression Note    Patient Details  Name: [name] [name] MRN: [id number] Date of Birth: 13-Mar-1966  Transition of Care [facility]) CM/SW Contact  Harriet Masson, RN Phone Number: [phone] 11/09/2020, 11:07 AM  Clinical Narrative:    Seeking transportation services. Attempts to arrange transportation services.  [place] indicated on private pay spoke with [facility] [employer] only available until 3 PM for driver [facility] [employer] only able to leave a voice message One Call requires 48 hr notice required via recording  11:00 AM Called [facility] [phone] opt 1,3 spoke with supervisor ([place]) who indicated this services can accommodate pt's with his power wheelchair for transportation home today after his dialysis.  Transportation physical address: [address] [place], [place] Necessity completed to provide to the transport services.      Expected Discharge Plan and Services           Expected Discharge Date: 11/08/20                                     Social Determinants of Health (SDOH) Interventions    Readmission Risk Interventions Readmission Risk Prevention Plan 08/04/2020  PCP or Specialist Appt within 3-5 Days Complete  HRI or [place] Complete  Social Work Consult for [place] Planning/Counseling Complete  Palliative Care Screening Complete  Medication Review [employer]) Complete  Some recent data might be hidden

## 2020-11-10 ENCOUNTER — Encounter: Payer: Self-pay | Admitting: Vascular Surgery

## 2020-11-10 DIAGNOSIS — I2510 Atherosclerotic heart disease of native coronary artery without angina pectoris: Secondary | ICD-10-CM | POA: Diagnosis not present

## 2020-11-10 DIAGNOSIS — N186 End stage renal disease: Secondary | ICD-10-CM | POA: Diagnosis not present

## 2020-11-10 DIAGNOSIS — T8241XA Breakdown (mechanical) of vascular dialysis catheter, initial encounter: Secondary | ICD-10-CM | POA: Diagnosis not present

## 2020-11-10 DIAGNOSIS — Z992 Dependence on renal dialysis: Secondary | ICD-10-CM | POA: Diagnosis not present

## 2020-11-10 DIAGNOSIS — E1140 Type 2 diabetes mellitus with diabetic neuropathy, unspecified: Secondary | ICD-10-CM | POA: Diagnosis not present

## 2020-11-10 DIAGNOSIS — J449 Chronic obstructive pulmonary disease, unspecified: Secondary | ICD-10-CM | POA: Diagnosis not present

## 2020-11-10 DIAGNOSIS — Z79899 Other long term (current) drug therapy: Secondary | ICD-10-CM | POA: Diagnosis not present

## 2020-11-10 DIAGNOSIS — Z7901 Long term (current) use of anticoagulants: Secondary | ICD-10-CM | POA: Diagnosis not present

## 2020-11-10 DIAGNOSIS — I4891 Unspecified atrial fibrillation: Secondary | ICD-10-CM | POA: Diagnosis not present

## 2020-11-10 LAB — GLUCOSE, CAPILLARY
Glucose-Capillary: 56 mg/dL — ABNORMAL LOW (ref 70–99)
Glucose-Capillary: 66 mg/dL — ABNORMAL LOW (ref 70–99)
Glucose-Capillary: 83 mg/dL (ref 70–99)

## 2020-11-10 MED ORDER — GLUCOSE 40 % PO GEL: 1.0000 | ORAL | Status: DC

## 2020-11-10 NOTE — Care Management (Signed)
11-10-20 1015 Case Manager spoke with patient this am regarding transportation home. Patient states the usual transportation [facility] is closed on Sundays and will need a ride home. Staff RN was trying to arrange last night and called [employer]-whom approved [facility] [employer] for Sunday transport home. Case Manager called and verified that Betsy Pries has a contract with [facility] and they will assist the patient in getting home today. Face Sheet and Medical Necessity filled out and placed on the shadow chart. [facility] will call the RN to make her aware of ETA. [facility] is aware that patient will have a motorized scooter that needs to be transported as well. No further needs from Case Manager at this time. Bethena Roys, RN,BSN Case Manager

## 2020-11-11 ENCOUNTER — Other Ambulatory Visit: Payer: Self-pay

## 2020-11-11 ENCOUNTER — Other Ambulatory Visit: Payer: Medicare HMO

## 2020-11-11 DIAGNOSIS — N186 End stage renal disease: Secondary | ICD-10-CM

## 2020-11-11 DIAGNOSIS — E1122 Type 2 diabetes mellitus with diabetic chronic kidney disease: Secondary | ICD-10-CM | POA: Diagnosis not present

## 2020-11-11 DIAGNOSIS — Z992 Dependence on renal dialysis: Secondary | ICD-10-CM

## 2020-11-12 DIAGNOSIS — N186 End stage renal disease: Secondary | ICD-10-CM | POA: Diagnosis not present

## 2020-11-12 DIAGNOSIS — N2581 Secondary hyperparathyroidism of renal origin: Secondary | ICD-10-CM | POA: Diagnosis not present

## 2020-11-12 DIAGNOSIS — Z992 Dependence on renal dialysis: Secondary | ICD-10-CM | POA: Diagnosis not present

## 2020-11-12 NOTE — Discharge Summary (Addendum)
[facility] Discharge Summary   Patient ID:  [name] [name] MRN: [id number] DOB/AGE: February 04, 1966 55 y.o.  Admission:  11/08/2020 Discharge:  11/10/2020  Attending Surgeon: Stanford Breed Admission Diagnosis: Hemodialysis catheter dysfunction ([place]) [T82.41XA] ESRD (end stage renal disease) ([place]) [N18.6]  Discharge Diagnoses:  Hemodialysis catheter dysfunction ([place]) [T82.41XA] ESRD (end stage renal disease) ([place]) [N18.6]  Secondary Diagnoses: Past Medical History:  Diagnosis Date  . Allergic rhinitis   . Allergy   . Anemia   . Aortic insufficiency    SBE in 2016 >> pt declined AVR // a. Echo 7/16:  Mod LVH, EF 50-55%, mild to mod AI, severe LAE, PASP 57 mmHg (LV ID end diastolic 62.3 mm);  b. Echo 2/17:  Mod LVH, EF 50-55%, mod AI, MAC, mild MR, mod LAE, mild to mod TR, PASP 63 mmHg // Echo 2/21: normal EF, mild MR, mild AI  . Atrial fibrillation and flutter ([place])    chronic anticoag with Warfarin // bradycardia noted during admit 10/21  . Bilateral carpal tunnel syndrome   . Bradycardia    brady/low BP event during admit in 10/21 >> poor candidate for pacer/EPS due to high risk of infection with ESRD; pacer not recommended by EP  . CAD (coronary artery disease)    s/p NSTEMI 10/21: cath>> pLAD 99 >> tx with DES; Lat RI 100 and OM1 100 - med Rx  . Cataract    removed both eyes   . Cholecystitis    dx during admx 10/21 >> HIDA neg // exam benign // high risk for surgery >> no surgical intervention recommended.   . Claustrophobia   . COPD (chronic obstructive pulmonary disease) ([place])   . Coronary artery disease   . Diabetes mellitus    Type II  . Diabetic peripheral neuropathy ([place])   . Diabetic retinopathy ([place])   . Discitis of lumbar region    resolved  . DVT (deep venous thrombosis) ([place]) 10/07/2015   LLE  . Dysrhythmia 07/2020   afib  . Endocarditis 11/28/14  . ESRD (end stage renal disease) on dialysis [facility])    s/p renal transplant in 2009, failed in  2016  . Gait disorder   . Gastroparesis   . GERD (gastroesophageal reflux disease)   . Gout   . Hearing difficulty   . Hemodialysis patient ([place])    tuesday, thursday, saturday   . HFrEF (HF with reduced EF)    Ischemic CM // Echo 10/21: EF 40-45, dist sept and apical AK, mild LVH, normal RVSF, mod MAC  . Hyperlipidemia   . Hypertension   . Incomplete bladder emptying   . Leg pain 02/05/11   with walking  . Morbid obesity ([place])   . Myocardial infarction ([place]) 07/2020  . Obstructive sleep apnea    not using CPAP- lost 100lbs  . Osteomyelitis ([place])   . Posttraumatic stress disorder   . Rotator cuff disorder   . Secondary hyperparathyroidism ([place])   . Sepsis ([place])    Postive blood cultures -   . Sleep apnea    no cpap  . Urethral stricture   . Vitamin D deficiency     Procedures: 11/08/2020 Procedure(s): INSERTION OF DIALYSIS CATHETER Excision of Foreign Body  Discharged Condition: good  HPI:  Hospital Course:  [name] [name] is a 55 y.o. male is 4 Days Post-Op Procedure(s): INSERTION OF DIALYSIS CATHETER Excision of Foreign Body   Post-op wounds healing well Pt. Ambulating, voiding and taking PO diet without difficulty. Pt pain controlled  with PO pain meds. Labs as below Complications:none  Consults: none   Significant Diagnostic Studies: CBC    Component Value Date/Time   WBC 6.7 11/09/2020 0313   RBC 2.93 (L) 11/09/2020 0313   HGB 8.8 (L) 11/09/2020 0313   HCT 30.1 (L) 11/09/2020 0313   PLT 196 11/09/2020 0313   MCV 102.7 (H) 11/09/2020 0313   MCH 30.0 11/09/2020 0313   MCHC 29.2 (L) 11/09/2020 0313   RDW 18.6 (H) 11/09/2020 0313   LYMPHSABS 1.0 10/20/2019 0953   MONOABS 0.4 10/20/2019 0953   EOSABS 0.0 10/20/2019 0953   BASOSABS 0.0 10/20/2019 0953    BMET    Component Value Date/Time   NA 134 (L) 11/08/2020 1244   K 6.5 (HH) 11/09/2020 0313   CL 104 11/08/2020 1244   CO2 25 08/03/2020 0746   GLUCOSE 183 (H) 11/08/2020 1244   BUN 75 (H)  11/08/2020 1244   CREATININE 13.25 (H) 11/09/2020 0313   CALCIUM 8.1 (L) 08/03/2020 0746   GFRNONAA 4 (L) 11/09/2020 0313   GFRAA 4 (L) 08/01/2019 0746    COAG estimated creatinine clearance is 8.6 mL/min (A) (by C-G formula based on SCr of 13.25 mg/dL (H)).  No results found for: PTT  Disposition:  Discharge to :Home Discharge Instructions    Discharge patient   Complete by: As directed    Discharge after HD session   Discharge disposition: 01-Home or Self Care   Discharge patient date: 11/08/2020     Allergies as of 11/10/2020      Reactions   Pork-derived Products Anaphylaxis, Other (See Comments)   Fever also    Ace Inhibitors Cough   Hydrocodone Other (See Comments)   Hallucinations   Pork Allergy    Other reaction(s): Unknown      Medication List    TAKE these medications   Accu-Chek FastClix Lancets Misc USE TO CHECK BLOOD SUGARS 4 TIMES A DAY.   Accu-Chek Guide test strip Generic drug: glucose blood USE AS INSTRUCTED TO CHECK BLOOD SUGAR 3 TIMES DAILY AS NEEDED   acetaminophen 325 MG tablet Commonly known as: TYLENOL Take 2 tablets (650 mg total) by mouth every 4 (four) hours as needed for headache or mild pain.   allopurinol 100 MG tablet Commonly known as: ZYLOPRIM Take 1 tablet (100 mg total) by mouth 3 (three) times a week. After dialysis What changed: when to take this   atorvastatin 80 MG tablet Commonly known as: LIPITOR Take 1 tablet (80 mg total) by mouth daily.   Auryxia 1 GM 210 MG(Fe) tablet Generic drug: ferric citrate Take 630 mg by mouth 3 (three) times daily with meals.   busPIRone 5 MG tablet Commonly known as: BUSPAR TAKE 1 TABLET (5 MG TOTAL) BY MOUTH 2 (TWO) TIMES DAILY AS NEEDED. What changed: See the new instructions.   clonazePAM 0.5 MG tablet Commonly known as: KLONOPIN Use 1-2 tablets prior to dialysis 3 times weekly. What changed:   how much to take  how to take this  when to take this  additional  instructions   clopidogrel 75 MG tablet Commonly known as: PLAVIX Take 1 tablet (75 mg total) by mouth daily with breakfast.   HumuLIN R U-500 KwikPen 500 UNIT/ML kwikpen Generic drug: insulin regular human CONCENTRATED INJECT 50 UNITS UNDER THE SKIN AT BREAKFAST, 70 UNITS AT LUNCH, AND 60 UNITS AT DINNER. What changed: See the new instructions.   Insulin Pen Needle 32G X 8 MM Misc Use with insulin x5  times a day What changed:   how much to take  how to take this  when to take this   Droplet Pen Needles 32G X 4 MM Misc Generic drug: Insulin Pen Needle USE&nbsp;&nbsp;TO INJECT FIVE TIMES DAILY AS DIRECTED What changed: Another medication with the same name was changed. Make sure you understand how and when to take each.   midodrine 10 MG tablet Commonly known as: PROAMATINE Take 10 mg by mouth 3 (three) times daily.   MIRCERA IJ Mircera   omeprazole 20 MG capsule Commonly known as: PRILOSEC TAKE 1 CAPSULE EVERY DAY   sevelamer carbonate 800 MG tablet Commonly known as: RENVELA Take 2,400 mg by mouth in the morning, at noon, and at bedtime.   Toujeo Max SoloStar 300 UNIT/ML Solostar Pen Generic drug: insulin glargine (2 Unit Dial) Inject 40 units in the morning and 40 units in the evening. What changed:   how much to take  how to take this  when to take this  additional instructions   Veltassa 8.4 g packet Generic drug: patiromer Take 8.4 g by mouth daily.   warfarin 5 MG tablet Commonly known as: COUMADIN Take as directed. If you are unsure how to take this medication, talk to your nurse or doctor. Original instructions: Take 1 tablet (5 mg total) by mouth See admin instructions. TAKE 1 TABLET EVERY DAY OR AS DIRECTED BY COUMADIN CLINIC. What changed:   when to take this  additional instructions       Verbal and written Discharge instructions given to the patient. Wound care per Discharge AVS  Signed: [name] 11/12/2020, 4:04 PM

## 2020-11-15 ENCOUNTER — Ambulatory Visit: Payer: Medicare HMO | Admitting: Endocrinology

## 2020-11-15 ENCOUNTER — Other Ambulatory Visit: Payer: Medicare HMO

## 2020-11-21 ENCOUNTER — Telehealth: Payer: Self-pay | Admitting: Internal Medicine

## 2020-11-21 NOTE — Telephone Encounter (Signed)
LVM for pt to rtn my call to schedule awv with nha. Please schedulept calls the office. this appt if

## 2020-11-22 ENCOUNTER — Ambulatory Visit: Payer: Medicare HMO | Admitting: Endocrinology

## 2020-12-02 DIAGNOSIS — N186 End stage renal disease: Secondary | ICD-10-CM | POA: Diagnosis not present

## 2020-12-02 DIAGNOSIS — Z992 Dependence on renal dialysis: Secondary | ICD-10-CM | POA: Diagnosis not present

## 2020-12-02 DIAGNOSIS — N2581 Secondary hyperparathyroidism of renal origin: Secondary | ICD-10-CM | POA: Diagnosis not present

## 2020-12-03 ENCOUNTER — Encounter: Payer: Medicare HMO | Admitting: Vascular Surgery

## 2020-12-03 ENCOUNTER — Ambulatory Visit (HOSPITAL_COMMUNITY): Admission: RE | Admit: 2020-12-03 | Payer: Medicare HMO | Source: Ambulatory Visit

## 2020-12-03 DIAGNOSIS — N2581 Secondary hyperparathyroidism of renal origin: Secondary | ICD-10-CM | POA: Diagnosis not present

## 2020-12-03 DIAGNOSIS — N186 End stage renal disease: Secondary | ICD-10-CM | POA: Diagnosis not present

## 2020-12-03 DIAGNOSIS — Z992 Dependence on renal dialysis: Secondary | ICD-10-CM | POA: Diagnosis not present

## 2020-12-04 ENCOUNTER — Other Ambulatory Visit: Payer: Self-pay | Admitting: Endocrinology

## 2020-12-05 DIAGNOSIS — N2581 Secondary hyperparathyroidism of renal origin: Secondary | ICD-10-CM | POA: Diagnosis not present

## 2020-12-05 DIAGNOSIS — N186 End stage renal disease: Secondary | ICD-10-CM | POA: Diagnosis not present

## 2020-12-05 DIAGNOSIS — Z992 Dependence on renal dialysis: Secondary | ICD-10-CM | POA: Diagnosis not present

## 2020-12-06 ENCOUNTER — Encounter (HOSPITAL_COMMUNITY): Payer: Medicare HMO

## 2020-12-06 LAB — POCT INR: INR: 1.4 — AB (ref 2.0–3.0)

## 2020-12-07 DIAGNOSIS — N2581 Secondary hyperparathyroidism of renal origin: Secondary | ICD-10-CM | POA: Diagnosis not present

## 2020-12-07 DIAGNOSIS — N186 End stage renal disease: Secondary | ICD-10-CM | POA: Diagnosis not present

## 2020-12-07 DIAGNOSIS — Z992 Dependence on renal dialysis: Secondary | ICD-10-CM | POA: Diagnosis not present

## 2020-12-09 ENCOUNTER — Ambulatory Visit (INDEPENDENT_AMBULATORY_CARE_PROVIDER_SITE_OTHER): Payer: Medicare HMO

## 2020-12-09 DIAGNOSIS — I82502 Chronic embolism and thrombosis of unspecified deep veins of left lower extremity: Secondary | ICD-10-CM

## 2020-12-09 DIAGNOSIS — E1142 Type 2 diabetes mellitus with diabetic polyneuropathy: Secondary | ICD-10-CM | POA: Diagnosis not present

## 2020-12-09 DIAGNOSIS — M4627 Osteomyelitis of vertebra, lumbosacral region: Secondary | ICD-10-CM | POA: Diagnosis not present

## 2020-12-09 DIAGNOSIS — I358 Other nonrheumatic aortic valve disorders: Secondary | ICD-10-CM | POA: Diagnosis not present

## 2020-12-09 DIAGNOSIS — Z7901 Long term (current) use of anticoagulants: Secondary | ICD-10-CM | POA: Diagnosis not present

## 2020-12-09 DIAGNOSIS — I38 Endocarditis, valve unspecified: Secondary | ICD-10-CM | POA: Diagnosis not present

## 2020-12-09 DIAGNOSIS — R2681 Unsteadiness on feet: Secondary | ICD-10-CM | POA: Diagnosis not present

## 2020-12-09 NOTE — Patient Instructions (Signed)
Pre visit review using our clinic review tool, if applicable. No additional management support is needed unless otherwise documented below in the visit note. 

## 2020-12-10 DIAGNOSIS — N186 End stage renal disease: Secondary | ICD-10-CM | POA: Diagnosis not present

## 2020-12-10 DIAGNOSIS — N2581 Secondary hyperparathyroidism of renal origin: Secondary | ICD-10-CM | POA: Diagnosis not present

## 2020-12-10 DIAGNOSIS — Z992 Dependence on renal dialysis: Secondary | ICD-10-CM | POA: Diagnosis not present

## 2020-12-11 NOTE — Progress Notes (Signed)
Agree. Thanks

## 2020-12-12 DIAGNOSIS — N186 End stage renal disease: Secondary | ICD-10-CM | POA: Diagnosis not present

## 2020-12-12 DIAGNOSIS — N2581 Secondary hyperparathyroidism of renal origin: Secondary | ICD-10-CM | POA: Diagnosis not present

## 2020-12-12 DIAGNOSIS — Z992 Dependence on renal dialysis: Secondary | ICD-10-CM | POA: Diagnosis not present

## 2020-12-12 LAB — PROTIME-INR: INR: 2.1 — AB (ref 0.9–1.1)

## 2020-12-14 DIAGNOSIS — Z992 Dependence on renal dialysis: Secondary | ICD-10-CM | POA: Diagnosis not present

## 2020-12-14 DIAGNOSIS — N2581 Secondary hyperparathyroidism of renal origin: Secondary | ICD-10-CM | POA: Diagnosis not present

## 2020-12-14 DIAGNOSIS — N186 End stage renal disease: Secondary | ICD-10-CM | POA: Diagnosis not present

## 2020-12-16 ENCOUNTER — Ambulatory Visit (INDEPENDENT_AMBULATORY_CARE_PROVIDER_SITE_OTHER): Payer: Medicare HMO | Admitting: General Practice

## 2020-12-16 ENCOUNTER — Telehealth: Payer: Self-pay

## 2020-12-16 DIAGNOSIS — Z7901 Long term (current) use of anticoagulants: Secondary | ICD-10-CM

## 2020-12-16 LAB — POCT INR: INR: 2.1 (ref 2.0–3.0)

## 2020-12-16 NOTE — Patient Instructions (Signed)
Pre visit review using our clinic review tool, if applicable. No additional management support is needed unless otherwise documented below in the visit note.  Please continue to take 1 (5 mg) tablet daily.  Dialysis patient.

## 2020-12-16 NOTE — Telephone Encounter (Signed)
Thank You.

## 2020-12-16 NOTE — Telephone Encounter (Signed)
PT/INR result faxed form [facility](dialysis).  This pts coumadin is managed by Villa Herb, RN. She has been made aware of result of 2.07 and asked that result be sent for scanning.  Result has been sent for scanning.

## 2020-12-17 ENCOUNTER — Other Ambulatory Visit: Payer: Self-pay

## 2020-12-17 DIAGNOSIS — N186 End stage renal disease: Secondary | ICD-10-CM | POA: Diagnosis not present

## 2020-12-17 DIAGNOSIS — N2581 Secondary hyperparathyroidism of renal origin: Secondary | ICD-10-CM | POA: Diagnosis not present

## 2020-12-17 DIAGNOSIS — Z992 Dependence on renal dialysis: Secondary | ICD-10-CM | POA: Diagnosis not present

## 2020-12-18 ENCOUNTER — Telehealth: Payer: Self-pay | Admitting: Internal Medicine

## 2020-12-18 DIAGNOSIS — E113553 Type 2 diabetes mellitus with stable proliferative diabetic retinopathy, bilateral: Secondary | ICD-10-CM | POA: Diagnosis not present

## 2020-12-18 DIAGNOSIS — Z794 Long term (current) use of insulin: Secondary | ICD-10-CM | POA: Diagnosis not present

## 2020-12-18 DIAGNOSIS — Z961 Presence of intraocular lens: Secondary | ICD-10-CM | POA: Diagnosis not present

## 2020-12-18 DIAGNOSIS — H04123 Dry eye syndrome of bilateral lacrimal glands: Secondary | ICD-10-CM | POA: Diagnosis not present

## 2020-12-18 LAB — HM DIABETES EYE EXAM

## 2020-12-18 NOTE — Telephone Encounter (Signed)
Patient called and was wondering if there was a change to his medication. He said that he used to take clonazePAM (KLONOPIN) 0.5 MG tablet 2 times daily and now it says 3 times a week prior to dialysis. Please advise. He can be reached at [phone]

## 2020-12-19 DIAGNOSIS — N2581 Secondary hyperparathyroidism of renal origin: Secondary | ICD-10-CM | POA: Diagnosis not present

## 2020-12-19 DIAGNOSIS — N186 End stage renal disease: Secondary | ICD-10-CM | POA: Diagnosis not present

## 2020-12-19 DIAGNOSIS — Z992 Dependence on renal dialysis: Secondary | ICD-10-CM | POA: Diagnosis not present

## 2020-12-19 NOTE — Telephone Encounter (Signed)
See below

## 2020-12-19 NOTE — Telephone Encounter (Signed)
3 times a week prior to dialysis is appropriate and this is how he told me he was taking it.

## 2020-12-20 ENCOUNTER — Other Ambulatory Visit: Payer: Self-pay

## 2020-12-20 ENCOUNTER — Ambulatory Visit (INDEPENDENT_AMBULATORY_CARE_PROVIDER_SITE_OTHER): Payer: Medicare HMO | Admitting: Physician Assistant

## 2020-12-20 ENCOUNTER — Ambulatory Visit (HOSPITAL_COMMUNITY)
Admission: RE | Admit: 2020-12-20 | Discharge: 2020-12-20 | Disposition: A | Discharge status: Home / Self Care | Payer: Medicare HMO | Source: Ambulatory Visit | Attending: Vascular Surgery | Admitting: Vascular Surgery

## 2020-12-20 VITALS — BP 112/52 | HR 110 | Temp 98.3°F | Resp 20 | Ht 70.0 in | Wt 297.0 lb

## 2020-12-20 DIAGNOSIS — N186 End stage renal disease: Secondary | ICD-10-CM

## 2020-12-20 DIAGNOSIS — Z992 Dependence on renal dialysis: Secondary | ICD-10-CM

## 2020-12-20 LAB — POCT INR: INR: 1.9 — AB (ref 2.0–3.0)

## 2020-12-20 NOTE — Progress Notes (Signed)
POST OPERATIVE OFFICE NOTE    CC:  F/u for surgery  HPI:  This is a 55 y.o. male with ESRD.   He presented for new HD access with Dr. Stanford Breed 10/08/2020.  He has failed multiple accesses in the past.  He has no usable upper extremity vein.  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  The right central axillary vein appeared patent.  This was followed by right upper extremity brachial axillary AV graft 06/12/2020.  This is unfortunately thrombosed.  He returns the clinic looking for new access.  He is currently dialyzing through a right IJ TDC.  Plan was to place RUE HeRO graft.    On 11/07/2020, he underwent RUE HeRO graft (right brachial - SVC) and right venogram by Dr. Stanford Breed.  OPERATIVE FINDINGS: SVC occluded at the [facility] catheter. Unable to pass new access from cranial approach. Wired the dialysis catheter to introduce the HeRO catheter. Placed an accuseal graft to the HeRO catheter to allow for early access.  Access can be used as early as tomorrow  Unfortunately, this thrombosed and the venous component of the HeRO was removed and a RIJ TDC was placed.    He presents back today for further discussion.  His incisions are healing.  He does have some pain around the catheter.  He is frustrated with access not working.      The pt is  on dialysis T/T/S at [facility] center location.   Allergies  Allergen Reactions  . Pork-Derived Products Anaphylaxis and Other (See Comments)    Fever also   . Ace Inhibitors Cough  . Hydrocodone Other (See Comments)    Hallucinations  . Pork Allergy     Other reaction(s): Unknown    Current Outpatient Medications  Medication Sig Dispense Refill  . Accu-Chek FastClix Lancets MISC USE TO CHECK BLOOD SUGARS 4 TIMES A DAY. 360 each 4  . acetaminophen (TYLENOL) 325 MG tablet Take 2 tablets (650 mg total) by mouth every 4 (four) hours as needed for headache or mild pain.    Marland Kitchen  allopurinol (ZYLOPRIM) 100 MG tablet Take 1 tablet (100 mg total) by mouth 3 (three) times a week. After dialysis (Patient taking differently: Take 100 mg by mouth every Tuesday, Thursday, and Saturday at 6 PM. After dialysis) 30 tablet 6  . atorvastatin (LIPITOR) 80 MG tablet Take 1 tablet (80 mg total) by mouth daily. 90 tablet 3  . AURYXIA 1 GM 210 MG(Fe) tablet Take 630 mg by mouth 3 (three) times daily with meals.    . busPIRone (BUSPAR) 5 MG tablet TAKE 1 TABLET (5 MG TOTAL) BY MOUTH 2 (TWO) TIMES DAILY AS NEEDED. (Patient taking differently: Take 5 mg by mouth daily as needed (Anxiety).) 180 tablet 0  . clonazePAM (KLONOPIN) 0.5 MG tablet Use 1-2 tablets prior to dialysis 3 times weekly. (Patient taking differently: Take 1 mg by mouth at bedtime.) 24 tablet 3  . clopidogrel (PLAVIX) 75 MG tablet Take 1 tablet (75 mg total) by mouth daily with breakfast. 90 tablet 3  . glucose blood (ACCU-CHEK GUIDE) test strip USE AS INSTRUCTED TO CHECK BLOOD SUGAR 3 TIMES DAILY AS NEEDED 300 strip 12  . insulin glargine, 2 Unit Dial, (TOUJEO MAX SOLOSTAR) 300 UNIT/ML Solostar Pen INJECT 80 UNITS UNDER THE SKIN IN THE MORNING AND 80 UNITS IN THE EVENING 48 mL 1  . Insulin Pen Needle (DROPLET PEN NEEDLES) 32G X 4 MM  MISC USE&nbsp;&nbsp;TO INJECT FIVE TIMES DAILY AS DIRECTED 500 each 1  . Insulin Pen Needle 32G X 8 MM MISC Use with insulin x5 times a day (Patient taking differently: 1 each by Other route See admin instructions. Use with insulin x5 times a day) 500 each 1  . insulin regular human CONCENTRATED (HUMULIN R U-500 KWIKPEN) 500 UNIT/ML kwikpen Inject 50-70 Units into the skin See admin instructions. Inject 50 units ad breakfast , 70 units at lunch, and 60 units at dinner 6 mL 3  . Methoxy PEG-Epoetin Beta (MIRCERA IJ) Mircera    . midodrine (PROAMATINE) 10 MG tablet Take 10 mg by mouth 3 (three) times daily.    Marland Kitchen omeprazole (PRILOSEC) 20 MG capsule TAKE 1 CAPSULE EVERY DAY (Patient taking differently:  Take 20 mg by mouth daily.) 90 capsule 0  . sevelamer carbonate (RENVELA) 800 MG tablet Take 2,400 mg by mouth in the morning, at noon, and at bedtime.    . VELTASSA 8.4 g packet Take 8.4 g by mouth daily.     Marland Kitchen warfarin (COUMADIN) 5 MG tablet Take 1 tablet (5 mg total) by mouth See admin instructions. TAKE 1 TABLET EVERY DAY OR AS DIRECTED BY COUMADIN CLINIC. (Patient taking differently: Take 5 mg by mouth at bedtime. OR AS DIRECTED BY COUMADIN CLINIC.) 100 tablet 1   No current facility-administered medications for this visit.     ROS:  See HPI  Physical Exam:  Today's Vitals   12/20/20 1334  BP: (!) 112/52  Pulse: (!) 110  Resp: 20  Temp: 98.3 F (36.8 C)  SpO2: 100%  Weight: 297 lb (134.7 kg)  Height: 5\' 10"  (1.778 m)  PainSc: 4    Body mass index is 42.62 kg/m.   Incision:  healing Extremities:   The catheter is in place without any drainage or erythema.      Assessment/Plan:  This is a 55 y.o. male who is s/p: Removal of HeRO graft and placement of TDC  -has failed multiple bilateral upper extremity access. He recently underwent right upper extremity AVG to very proximal axillary vein 06/12/20. This has unfortunately thrombosed. 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 He has a RIJ TDC in place. His right central veins appear patent on recent venogram. He is not a candidate for a thigh graft because of his large pannus.    He is very frustrated with this post operative course.  I gave him the option to come back and see Dr. Stanford Breed on Tuesday but unfortunately, he has dialysis and cannot come on that day, which is his only clinic day.  I discussed with Dr. Stanford Breed and pt does not have any further dialysis access options per Dr. Stanford Breed.  He is being referred to tertiary center to see if he has further options within their facility.    -he will f/u with [facility] as needed.    Leontine Locket, [facility] [phone]  Clinic MD:  Donzetta Matters on call MD

## 2020-12-21 DIAGNOSIS — Z992 Dependence on renal dialysis: Secondary | ICD-10-CM | POA: Diagnosis not present

## 2020-12-21 DIAGNOSIS — N186 End stage renal disease: Secondary | ICD-10-CM | POA: Diagnosis not present

## 2020-12-21 DIAGNOSIS — N2581 Secondary hyperparathyroidism of renal origin: Secondary | ICD-10-CM | POA: Diagnosis not present

## 2020-12-24 ENCOUNTER — Ambulatory Visit (INDEPENDENT_AMBULATORY_CARE_PROVIDER_SITE_OTHER): Payer: Medicare HMO | Admitting: General Practice

## 2020-12-24 ENCOUNTER — Telehealth: Payer: Self-pay

## 2020-12-24 DIAGNOSIS — Z7901 Long term (current) use of anticoagulants: Secondary | ICD-10-CM

## 2020-12-24 DIAGNOSIS — N186 End stage renal disease: Secondary | ICD-10-CM | POA: Diagnosis not present

## 2020-12-24 DIAGNOSIS — N2581 Secondary hyperparathyroidism of renal origin: Secondary | ICD-10-CM | POA: Diagnosis not present

## 2020-12-24 DIAGNOSIS — Z992 Dependence on renal dialysis: Secondary | ICD-10-CM | POA: Diagnosis not present

## 2020-12-24 LAB — POCT INR: INR: 1.9 — AB (ref 2.0–3.0)

## 2020-12-24 NOTE — Progress Notes (Signed)
Medical treatment/procedure(s) were performed by non-physician practitioner and as supervising physician I was immediately available for consultation/collaboration. I agree with above. Jene Huq A Nesta Kimple, MD  

## 2020-12-24 NOTE — Patient Instructions (Signed)
Pre visit review using our clinic review tool, if applicable. No additional management support is needed unless otherwise documented below in the visit note.  Please take 1 1/2 tablets today and then continue to take 1 (5 mg) tablet daily.  Dialysis patient. Re-check weekly

## 2020-12-24 NOTE — Telephone Encounter (Signed)
Received fax from [facility] with INR results for pt. Sent msg to Villa Herb, RN, coumadin nurse for pt. She asked that results be placed for scanning in pts chart. She will contact pt with any dosing instructions.

## 2020-12-26 DIAGNOSIS — Z992 Dependence on renal dialysis: Secondary | ICD-10-CM | POA: Diagnosis not present

## 2020-12-26 DIAGNOSIS — N2581 Secondary hyperparathyroidism of renal origin: Secondary | ICD-10-CM | POA: Diagnosis not present

## 2020-12-26 DIAGNOSIS — N186 End stage renal disease: Secondary | ICD-10-CM | POA: Diagnosis not present

## 2020-12-26 LAB — PROTIME-INR: INR: 2.2 — AB (ref 0.9–1.1)

## 2020-12-28 DIAGNOSIS — N186 End stage renal disease: Secondary | ICD-10-CM | POA: Diagnosis not present

## 2020-12-28 DIAGNOSIS — Z992 Dependence on renal dialysis: Secondary | ICD-10-CM | POA: Diagnosis not present

## 2020-12-28 DIAGNOSIS — N2581 Secondary hyperparathyroidism of renal origin: Secondary | ICD-10-CM | POA: Diagnosis not present

## 2020-12-30 ENCOUNTER — Ambulatory Visit (INDEPENDENT_AMBULATORY_CARE_PROVIDER_SITE_OTHER): Payer: Medicare HMO | Admitting: General Practice

## 2020-12-30 ENCOUNTER — Telehealth: Payer: Self-pay

## 2020-12-30 DIAGNOSIS — R6889 Other general symptoms and signs: Secondary | ICD-10-CM | POA: Diagnosis not present

## 2020-12-30 DIAGNOSIS — I808 Phlebitis and thrombophlebitis of other sites: Secondary | ICD-10-CM | POA: Diagnosis not present

## 2020-12-30 DIAGNOSIS — Z7901 Long term (current) use of anticoagulants: Secondary | ICD-10-CM

## 2020-12-30 DIAGNOSIS — Z01810 Encounter for preprocedural cardiovascular examination: Secondary | ICD-10-CM | POA: Diagnosis not present

## 2020-12-30 DIAGNOSIS — N186 End stage renal disease: Secondary | ICD-10-CM | POA: Diagnosis not present

## 2020-12-30 LAB — POCT INR: INR: 2.2 (ref 2.0–3.0)

## 2020-12-30 NOTE — Telephone Encounter (Signed)
Received fax from [facility] with patients INR results which was 2.19. Meriam Sprague, Coumadin RN made aware. Will place in folder to scan.

## 2020-12-30 NOTE — Patient Instructions (Signed)
Pre visit review using our clinic review tool, if applicable. No additional management support is needed unless otherwise documented below in the visit note.  Continue to take 1 (5 mg) tablet daily.  Dialysis patient. Re-check weekly.  *Asked patient to have dialysis RN contact Villa Herb, RN to have fax# changed.

## 2020-12-31 DIAGNOSIS — Z992 Dependence on renal dialysis: Secondary | ICD-10-CM | POA: Diagnosis not present

## 2020-12-31 DIAGNOSIS — N2581 Secondary hyperparathyroidism of renal origin: Secondary | ICD-10-CM | POA: Diagnosis not present

## 2020-12-31 DIAGNOSIS — N186 End stage renal disease: Secondary | ICD-10-CM | POA: Diagnosis not present

## 2020-12-31 NOTE — Telephone Encounter (Signed)
See below

## 2020-12-31 NOTE — Telephone Encounter (Signed)
Patient calling to report he takes   clonazePAM (KLONOPIN) 0.5 MG tablet twice daily for the past 15 years  Requesting RX be updated  Please call patient

## 2021-01-01 NOTE — Telephone Encounter (Signed)
Spoke with the patient and he stated that he doesn't recall telling you how he was taking his clonazepam. He stated that his unable to make an appointment right due to his current issues with vascular and dialysis. Patient stated that he would like a call from Dr. Sharlet Salina to discuss. Please advise.

## 2021-01-01 NOTE — Telephone Encounter (Signed)
I wrote rx based on how he told me he was taking it. I will not change outside of visit.

## 2021-01-02 DIAGNOSIS — N186 End stage renal disease: Secondary | ICD-10-CM | POA: Diagnosis not present

## 2021-01-02 DIAGNOSIS — Z992 Dependence on renal dialysis: Secondary | ICD-10-CM | POA: Diagnosis not present

## 2021-01-02 DIAGNOSIS — N2581 Secondary hyperparathyroidism of renal origin: Secondary | ICD-10-CM | POA: Diagnosis not present

## 2021-01-02 DIAGNOSIS — T80219A Unspecified infection due to central venous catheter, initial encounter: Secondary | ICD-10-CM | POA: Insufficient documentation

## 2021-01-02 LAB — POCT INR: INR: 2.4 (ref 2.0–3.0)

## 2021-01-02 NOTE — Telephone Encounter (Signed)
He can make a video visit if unable to come to office. This visit was from January where we discussed his medication.

## 2021-01-03 NOTE — Telephone Encounter (Signed)
LVM asking the patient to return my call here at the office. Office number was provided.

## 2021-01-04 DIAGNOSIS — N186 End stage renal disease: Secondary | ICD-10-CM | POA: Diagnosis not present

## 2021-01-04 DIAGNOSIS — Z992 Dependence on renal dialysis: Secondary | ICD-10-CM | POA: Diagnosis not present

## 2021-01-04 DIAGNOSIS — N2581 Secondary hyperparathyroidism of renal origin: Secondary | ICD-10-CM | POA: Diagnosis not present

## 2021-01-06 ENCOUNTER — Ambulatory Visit: Payer: Medicare HMO | Admitting: Internal Medicine

## 2021-01-07 DIAGNOSIS — I358 Other nonrheumatic aortic valve disorders: Secondary | ICD-10-CM | POA: Diagnosis not present

## 2021-01-07 DIAGNOSIS — R2681 Unsteadiness on feet: Secondary | ICD-10-CM | POA: Diagnosis not present

## 2021-01-07 DIAGNOSIS — M4627 Osteomyelitis of vertebra, lumbosacral region: Secondary | ICD-10-CM | POA: Diagnosis not present

## 2021-01-07 DIAGNOSIS — Z992 Dependence on renal dialysis: Secondary | ICD-10-CM | POA: Diagnosis not present

## 2021-01-07 DIAGNOSIS — N186 End stage renal disease: Secondary | ICD-10-CM | POA: Diagnosis not present

## 2021-01-07 DIAGNOSIS — E1142 Type 2 diabetes mellitus with diabetic polyneuropathy: Secondary | ICD-10-CM | POA: Diagnosis not present

## 2021-01-07 DIAGNOSIS — I38 Endocarditis, valve unspecified: Secondary | ICD-10-CM | POA: Diagnosis not present

## 2021-01-07 DIAGNOSIS — N2581 Secondary hyperparathyroidism of renal origin: Secondary | ICD-10-CM | POA: Diagnosis not present

## 2021-01-09 ENCOUNTER — Telehealth: Payer: Self-pay

## 2021-01-09 ENCOUNTER — Ambulatory Visit (INDEPENDENT_AMBULATORY_CARE_PROVIDER_SITE_OTHER): Payer: Medicare HMO | Admitting: General Practice

## 2021-01-09 DIAGNOSIS — E1122 Type 2 diabetes mellitus with diabetic chronic kidney disease: Secondary | ICD-10-CM | POA: Diagnosis not present

## 2021-01-09 DIAGNOSIS — Z992 Dependence on renal dialysis: Secondary | ICD-10-CM | POA: Diagnosis not present

## 2021-01-09 DIAGNOSIS — Z7901 Long term (current) use of anticoagulants: Secondary | ICD-10-CM

## 2021-01-09 DIAGNOSIS — N186 End stage renal disease: Secondary | ICD-10-CM | POA: Diagnosis not present

## 2021-01-09 DIAGNOSIS — N2581 Secondary hyperparathyroidism of renal origin: Secondary | ICD-10-CM | POA: Diagnosis not present

## 2021-01-09 LAB — POCT INR: INR: 2.4 (ref 2.0–3.0)

## 2021-01-09 NOTE — Telephone Encounter (Signed)
Noted  

## 2021-01-09 NOTE — Patient Instructions (Signed)
Pre visit review using our clinic review tool, if applicable. No additional management support is needed unless otherwise documented below in the visit note.  Continue to take 1 (5 mg) tablet daily.  Dialysis patient. Re-check weekly.  *Asked patient to have dialysis RN contact Villa Herb, RN to have fax# changed.

## 2021-01-09 NOTE — Telephone Encounter (Signed)
Received fax from [facility] with patients INR which was 2.42 collected on 01/02/2021. Sending to [employer], Coumadin RN.

## 2021-01-09 NOTE — Progress Notes (Signed)
Medical treatment/procedure(s) were performed by non-physician practitioner and as supervising physician I was immediately available for consultation/collaboration. I agree with above. Jiovany Scheffel A Dacy Enrico, MD  

## 2021-01-10 DIAGNOSIS — Z992 Dependence on renal dialysis: Secondary | ICD-10-CM | POA: Diagnosis not present

## 2021-01-10 DIAGNOSIS — N186 End stage renal disease: Secondary | ICD-10-CM | POA: Diagnosis not present

## 2021-01-10 DIAGNOSIS — E1122 Type 2 diabetes mellitus with diabetic chronic kidney disease: Secondary | ICD-10-CM | POA: Diagnosis not present

## 2021-01-11 DIAGNOSIS — N186 End stage renal disease: Secondary | ICD-10-CM | POA: Diagnosis not present

## 2021-01-11 DIAGNOSIS — Z992 Dependence on renal dialysis: Secondary | ICD-10-CM | POA: Diagnosis not present

## 2021-01-11 DIAGNOSIS — N2581 Secondary hyperparathyroidism of renal origin: Secondary | ICD-10-CM | POA: Diagnosis not present

## 2021-01-13 ENCOUNTER — Inpatient Hospital Stay (HOSPITAL_COMMUNITY)
Admission: EM | Admit: 2021-01-13 | Discharge: 2021-01-18 | DRG: 640 | Disposition: A | Discharge status: Home / Self Care | Payer: Medicare HMO | Source: Ambulatory Visit | Attending: Family Medicine | Admitting: Family Medicine

## 2021-01-13 ENCOUNTER — Encounter (HOSPITAL_COMMUNITY): Payer: Self-pay | Admitting: Emergency Medicine

## 2021-01-13 ENCOUNTER — Other Ambulatory Visit: Payer: Self-pay

## 2021-01-13 ENCOUNTER — Other Ambulatory Visit: Payer: Self-pay | Admitting: Internal Medicine

## 2021-01-13 DIAGNOSIS — Z20822 Contact with and (suspected) exposure to covid-19: Secondary | ICD-10-CM | POA: Diagnosis present

## 2021-01-13 DIAGNOSIS — Z992 Dependence on renal dialysis: Secondary | ICD-10-CM

## 2021-01-13 DIAGNOSIS — I9589 Other hypotension: Secondary | ICD-10-CM | POA: Diagnosis present

## 2021-01-13 DIAGNOSIS — E1129 Type 2 diabetes mellitus with other diabetic kidney complication: Secondary | ICD-10-CM | POA: Diagnosis not present

## 2021-01-13 DIAGNOSIS — L509 Urticaria, unspecified: Secondary | ICD-10-CM | POA: Diagnosis not present

## 2021-01-13 DIAGNOSIS — T7802XA Anaphylactic reaction due to shellfish (crustaceans), initial encounter: Secondary | ICD-10-CM | POA: Diagnosis present

## 2021-01-13 DIAGNOSIS — E11311 Type 2 diabetes mellitus with unspecified diabetic retinopathy with macular edema: Secondary | ICD-10-CM | POA: Diagnosis present

## 2021-01-13 DIAGNOSIS — K3184 Gastroparesis: Secondary | ICD-10-CM | POA: Diagnosis present

## 2021-01-13 DIAGNOSIS — E875 Hyperkalemia: Principal | ICD-10-CM | POA: Diagnosis present

## 2021-01-13 DIAGNOSIS — E1165 Type 2 diabetes mellitus with hyperglycemia: Secondary | ICD-10-CM | POA: Diagnosis present

## 2021-01-13 DIAGNOSIS — Z86718 Personal history of other venous thrombosis and embolism: Secondary | ICD-10-CM

## 2021-01-13 DIAGNOSIS — E785 Hyperlipidemia, unspecified: Secondary | ICD-10-CM | POA: Diagnosis present

## 2021-01-13 DIAGNOSIS — I480 Paroxysmal atrial fibrillation: Secondary | ICD-10-CM | POA: Diagnosis present

## 2021-01-13 DIAGNOSIS — N250 Renal osteodystrophy: Secondary | ICD-10-CM | POA: Diagnosis not present

## 2021-01-13 DIAGNOSIS — Z794 Long term (current) use of insulin: Secondary | ICD-10-CM

## 2021-01-13 DIAGNOSIS — K219 Gastro-esophageal reflux disease without esophagitis: Secondary | ICD-10-CM | POA: Diagnosis present

## 2021-01-13 DIAGNOSIS — I2510 Atherosclerotic heart disease of native coronary artery without angina pectoris: Secondary | ICD-10-CM | POA: Diagnosis present

## 2021-01-13 DIAGNOSIS — I214 Non-ST elevation (NSTEMI) myocardial infarction: Secondary | ICD-10-CM

## 2021-01-13 DIAGNOSIS — J810 Acute pulmonary edema: Secondary | ICD-10-CM | POA: Diagnosis not present

## 2021-01-13 DIAGNOSIS — M109 Gout, unspecified: Secondary | ICD-10-CM | POA: Diagnosis present

## 2021-01-13 DIAGNOSIS — I120 Hypertensive chronic kidney disease with stage 5 chronic kidney disease or end stage renal disease: Secondary | ICD-10-CM | POA: Diagnosis present

## 2021-01-13 DIAGNOSIS — Z8679 Personal history of other diseases of the circulatory system: Secondary | ICD-10-CM

## 2021-01-13 DIAGNOSIS — I25118 Atherosclerotic heart disease of native coronary artery with other forms of angina pectoris: Secondary | ICD-10-CM | POA: Diagnosis not present

## 2021-01-13 DIAGNOSIS — J811 Chronic pulmonary edema: Secondary | ICD-10-CM | POA: Diagnosis not present

## 2021-01-13 DIAGNOSIS — J9811 Atelectasis: Secondary | ICD-10-CM | POA: Diagnosis not present

## 2021-01-13 DIAGNOSIS — Z7901 Long term (current) use of anticoagulants: Secondary | ICD-10-CM

## 2021-01-13 DIAGNOSIS — S91301D Unspecified open wound, right foot, subsequent encounter: Secondary | ICD-10-CM

## 2021-01-13 DIAGNOSIS — E871 Hypo-osmolality and hyponatremia: Secondary | ICD-10-CM | POA: Diagnosis not present

## 2021-01-13 DIAGNOSIS — I469 Cardiac arrest, cause unspecified: Secondary | ICD-10-CM | POA: Diagnosis present

## 2021-01-13 DIAGNOSIS — E1142 Type 2 diabetes mellitus with diabetic polyneuropathy: Secondary | ICD-10-CM | POA: Diagnosis present

## 2021-01-13 DIAGNOSIS — D631 Anemia in chronic kidney disease: Secondary | ICD-10-CM | POA: Diagnosis present

## 2021-01-13 DIAGNOSIS — I468 Cardiac arrest due to other underlying condition: Secondary | ICD-10-CM | POA: Diagnosis present

## 2021-01-13 DIAGNOSIS — E1143 Type 2 diabetes mellitus with diabetic autonomic (poly)neuropathy: Secondary | ICD-10-CM | POA: Diagnosis present

## 2021-01-13 DIAGNOSIS — E1122 Type 2 diabetes mellitus with diabetic chronic kidney disease: Secondary | ICD-10-CM | POA: Diagnosis present

## 2021-01-13 DIAGNOSIS — J449 Chronic obstructive pulmonary disease, unspecified: Secondary | ICD-10-CM | POA: Diagnosis present

## 2021-01-13 DIAGNOSIS — N2581 Secondary hyperparathyroidism of renal origin: Secondary | ICD-10-CM | POA: Diagnosis present

## 2021-01-13 DIAGNOSIS — Z955 Presence of coronary angioplasty implant and graft: Secondary | ICD-10-CM

## 2021-01-13 DIAGNOSIS — Z95828 Presence of other vascular implants and grafts: Secondary | ICD-10-CM

## 2021-01-13 DIAGNOSIS — F419 Anxiety disorder, unspecified: Secondary | ICD-10-CM | POA: Diagnosis present

## 2021-01-13 DIAGNOSIS — Z09 Encounter for follow-up examination after completed treatment for conditions other than malignant neoplasm: Secondary | ICD-10-CM

## 2021-01-13 DIAGNOSIS — I252 Old myocardial infarction: Secondary | ICD-10-CM

## 2021-01-13 DIAGNOSIS — Z833 Family history of diabetes mellitus: Secondary | ICD-10-CM

## 2021-01-13 DIAGNOSIS — Z885 Allergy status to narcotic agent status: Secondary | ICD-10-CM

## 2021-01-13 DIAGNOSIS — Z8249 Family history of ischemic heart disease and other diseases of the circulatory system: Secondary | ICD-10-CM

## 2021-01-13 DIAGNOSIS — G4733 Obstructive sleep apnea (adult) (pediatric): Secondary | ICD-10-CM | POA: Diagnosis present

## 2021-01-13 DIAGNOSIS — I959 Hypotension, unspecified: Secondary | ICD-10-CM | POA: Diagnosis not present

## 2021-01-13 DIAGNOSIS — R0602 Shortness of breath: Secondary | ICD-10-CM | POA: Diagnosis not present

## 2021-01-13 DIAGNOSIS — R0902 Hypoxemia: Secondary | ICD-10-CM | POA: Diagnosis not present

## 2021-01-13 DIAGNOSIS — I517 Cardiomegaly: Secondary | ICD-10-CM | POA: Diagnosis not present

## 2021-01-13 DIAGNOSIS — R21 Rash and other nonspecific skin eruption: Secondary | ICD-10-CM | POA: Diagnosis not present

## 2021-01-13 DIAGNOSIS — F4310 Post-traumatic stress disorder, unspecified: Secondary | ICD-10-CM | POA: Diagnosis present

## 2021-01-13 DIAGNOSIS — T8612 Kidney transplant failure: Secondary | ICD-10-CM | POA: Diagnosis present

## 2021-01-13 DIAGNOSIS — I351 Nonrheumatic aortic (valve) insufficiency: Secondary | ICD-10-CM | POA: Diagnosis present

## 2021-01-13 DIAGNOSIS — Z7902 Long term (current) use of antithrombotics/antiplatelets: Secondary | ICD-10-CM

## 2021-01-13 DIAGNOSIS — E8889 Other specified metabolic disorders: Secondary | ICD-10-CM | POA: Diagnosis present

## 2021-01-13 DIAGNOSIS — N186 End stage renal disease: Secondary | ICD-10-CM | POA: Diagnosis present

## 2021-01-13 DIAGNOSIS — T7840XA Allergy, unspecified, initial encounter: Secondary | ICD-10-CM | POA: Diagnosis not present

## 2021-01-13 DIAGNOSIS — Z79899 Other long term (current) drug therapy: Secondary | ICD-10-CM

## 2021-01-13 DIAGNOSIS — M79606 Pain in leg, unspecified: Secondary | ICD-10-CM | POA: Diagnosis not present

## 2021-01-13 DIAGNOSIS — L299 Pruritus, unspecified: Secondary | ICD-10-CM | POA: Diagnosis not present

## 2021-01-13 DIAGNOSIS — R000 Tachycardia, unspecified: Secondary | ICD-10-CM | POA: Diagnosis not present

## 2021-01-13 DIAGNOSIS — E876 Hypokalemia: Secondary | ICD-10-CM | POA: Diagnosis not present

## 2021-01-13 DIAGNOSIS — L500 Allergic urticaria: Secondary | ICD-10-CM | POA: Diagnosis present

## 2021-01-13 DIAGNOSIS — Z6841 Body Mass Index (BMI) 40.0 and over, adult: Secondary | ICD-10-CM

## 2021-01-13 DIAGNOSIS — I5020 Unspecified systolic (congestive) heart failure: Secondary | ICD-10-CM | POA: Diagnosis not present

## 2021-01-13 DIAGNOSIS — Z888 Allergy status to other drugs, medicaments and biological substances status: Secondary | ICD-10-CM

## 2021-01-13 LAB — CBC WITH DIFFERENTIAL/PLATELET
Abs Immature Granulocytes: 0.01 10*3/uL (ref 0.00–0.07)
Basophils Absolute: 0.1 10*3/uL (ref 0.0–0.1)
Basophils Relative: 1 %
Eosinophils Absolute: 0.1 10*3/uL (ref 0.0–0.5)
Eosinophils Relative: 2 %
HCT: 39.7 % (ref 39.0–52.0)
Hemoglobin: 11.6 g/dL — ABNORMAL LOW (ref 13.0–17.0)
Immature Granulocytes: 0 %
Lymphocytes Relative: 22 %
Lymphs Abs: 1.3 10*3/uL (ref 0.7–4.0)
MCH: 28.8 pg (ref 26.0–34.0)
MCHC: 29.2 g/dL — ABNORMAL LOW (ref 30.0–36.0)
MCV: 98.5 fL (ref 80.0–100.0)
Monocytes Absolute: 0.5 10*3/uL (ref 0.1–1.0)
Monocytes Relative: 9 %
Neutro Abs: 4 10*3/uL (ref 1.7–7.7)
Neutrophils Relative %: 66 %
Platelets: 281 10*3/uL (ref 150–400)
RBC: 4.03 MIL/uL — ABNORMAL LOW (ref 4.22–5.81)
RDW: 17.7 % — ABNORMAL HIGH (ref 11.5–15.5)
WBC: 6 10*3/uL (ref 4.0–10.5)
nRBC: 0.3 % — ABNORMAL HIGH (ref 0.0–0.2)

## 2021-01-13 LAB — PROTIME-INR
INR: 1.8 — ABNORMAL HIGH (ref 0.8–1.2)
Prothrombin Time: 19.8 seconds — ABNORMAL HIGH (ref 11.4–15.2)

## 2021-01-13 LAB — CBG MONITORING, ED
Glucose-Capillary: 45 mg/dL — ABNORMAL LOW (ref 70–99)
Glucose-Capillary: 70 mg/dL (ref 70–99)

## 2021-01-13 MED ORDER — DEXAMETHASONE SODIUM PHOSPHATE 10 MG/ML IJ SOLN
10.0000 mg | Freq: Once | INTRAMUSCULAR | Status: AC
  Administered 2021-01-13: 10 mg via INTRAVENOUS
  Filled 2021-01-13: qty 1

## 2021-01-13 MED ORDER — DIPHENHYDRAMINE HCL 50 MG/ML IJ SOLN
25.0000 mg | Freq: Once | INTRAMUSCULAR | Status: AC
  Administered 2021-01-13: 25 mg via INTRAVENOUS
  Filled 2021-01-13: qty 1

## 2021-01-13 NOTE — ED Notes (Signed)
Provided pt with cranberry juice, cheese, crackers and peanut butter due to low glucose levels, pt refuses to eat any food given.

## 2021-01-13 NOTE — ED Triage Notes (Signed)
EMS stated, he has a mild allergic reaction to, he thinks its from eating fish last night. He has rash that started at his head and moved down his body to his buttocks.  Given Benadyl 50mg . Spur

## 2021-01-13 NOTE — ED Provider Notes (Signed)
[facility] EMERGENCY DEPARTMENT Provider Note   CSN: [id number] Arrival date & time: 01/13/21  1835     History Chief Complaint  Patient presents with  . Allergic Reaction  . Pruritis    [name] [name] is a 55 y.o. male.  The history is provided by the patient.  Allergic Reaction Presenting symptoms: itching and rash   Presenting symptoms: no difficulty breathing and no difficulty swallowing   Severity:  Severe Duration:  1 day Relieved by:  Nothing Worsened by:  Nothing Ineffective treatments:  Antihistamines  Patient with history of ESRD, CAD, A. fib fib on Coumadin presents with allergic reaction.  Patient reports he suspects this occurred after eating fish.  He reports he had a rash throughout his body and has significant itching.  It does not improve with Benadryl.  No GI symptoms.  No difficulty breathing.  No facial swelling    Past Medical History:  Diagnosis Date  . Allergic rhinitis   . Allergy   . Anemia   . Aortic insufficiency    SBE in 2016 >> pt declined AVR // a. Echo 7/16:  Mod LVH, EF 50-55%, mild to mod AI, severe LAE, PASP 57 mmHg (LV ID end diastolic 91.4 mm);  b. Echo 2/17:  Mod LVH, EF 50-55%, mod AI, MAC, mild MR, mod LAE, mild to mod TR, PASP 63 mmHg // Echo 2/21: normal EF, mild MR, mild AI  . Atrial fibrillation and flutter ([place])    chronic anticoag with Warfarin // bradycardia noted during admit 10/21  . Bilateral carpal tunnel syndrome   . Bradycardia    brady/low BP event during admit in 10/21 >> poor candidate for pacer/EPS due to high risk of infection with ESRD; pacer not recommended by EP  . CAD (coronary artery disease)    s/p NSTEMI 10/21: cath>> pLAD 99 >> tx with DES; Lat RI 100 and OM1 100 - med Rx  . Cataract    removed both eyes   . Cholecystitis    dx during admx 10/21 >> HIDA neg // exam benign // high risk for surgery >> no surgical intervention recommended.   . Claustrophobia   . COPD (chronic obstructive  pulmonary disease) ([place])   . Coronary artery disease   . Diabetes mellitus    Type II  . Diabetic peripheral neuropathy ([place])   . Diabetic retinopathy ([place])   . Discitis of lumbar region    resolved  . DVT (deep venous thrombosis) ([place]) 10/07/2015   LLE  . Dysrhythmia 07/2020   afib  . Endocarditis 11/28/14  . ESRD (end stage renal disease) on dialysis [facility])    s/p renal transplant in 2009, failed in 2016  . Gait disorder   . Gastroparesis   . GERD (gastroesophageal reflux disease)   . Gout   . Hearing difficulty   . Hemodialysis patient ([place])    tuesday, thursday, saturday   . HFrEF (HF with reduced EF)    Ischemic CM // Echo 10/21: EF 40-45, dist sept and apical AK, mild LVH, normal RVSF, mod MAC  . Hyperlipidemia   . Hypertension   . Incomplete bladder emptying   . Leg pain 02/05/11   with walking  . Morbid obesity ([place])   . Myocardial infarction ([place]) 07/2020  . Obstructive sleep apnea    not using CPAP- lost 100lbs  . Osteomyelitis ([place])   . Posttraumatic stress disorder   . Rotator cuff disorder   . Secondary hyperparathyroidism ([place])   .  Sepsis ([place])    Postive blood cultures -   . Sleep apnea    no cpap  . Urethral stricture   . Vitamin D deficiency     Patient Active Problem List   Diagnosis Date Noted  . Hemodialysis catheter dysfunction ([place]) 11/08/2020  . ESRD (end stage renal disease) ([place]) 11/08/2020  . Bradycardia 08/03/2020  . Hypotension 08/02/2020  . CAD (coronary artery disease) 08/02/2020  . Shock - Cardiogenic vs Septic   . NSTEMI (non-ST elevated myocardial infarction) ([place]) 07/27/2020  . Atrial Fibrillation/Flutter 07/27/2020  . Band keratopathy of both eyes 11/10/2019  . Pseudophakia of both eyes 11/10/2019  . Routine general medical examination at a health care facility 07/21/2019  . Complication of vascular dialysis catheter 07/17/2019  . Cholecystitis 04/20/2019  . Allergy, unspecified, sequela 12/29/2018  . Pain, unspecified  12/29/2018  . Leg lesion 06/16/2018  . Protein calorie malnutrition ([place]) 05/09/2018  . Hyperkalemia 04/11/2018  . Hemorrhoid 02/28/2018  . Numbness 10/20/2017  . Fluid overload, unspecified 06/04/2016  . Hypocalcemia 02/20/2016  . Anterior urethral stricture 12/09/2015  . Iron deficiency anemia, unspecified 11/30/2015  . Dependence on renal dialysis ([place]) 11/27/2015  . ESRD on dialysis ([place]) 11/15/2015  . Coagulation defect, unspecified ([place]) 11/02/2015  . HFrEF (heart failure with reduced ejection fraction) ([place]) 10/07/2015  . Prolonged Q-T interval on ECG 10/07/2015  . Chronic deep vein thrombosis (DVT) of left lower extremity ([place]) 10/07/2015  . Hypomagnesemia 06/26/2015  . Aortic insufficiency 04/12/2015  . Endocarditis 12/04/2014  . Lumbar discitis 11/22/2014  . Back pain at L4-L5 level   . Anemia due to chronic kidney disease   . Cervical radiculopathy   . Diabetic peripheral neuropathy ([place]) 10/16/2014  . Hematuria 08/06/2014  . Renal transplant, status post 04/05/2014  . OSA (obstructive sleep apnea) 04/05/2014  . Severe obesity (BMI >= 40) ([place]) 04/05/2014  . Benign fibroma of prostate 02/05/2014  . Bladder neck obstruction 02/05/2014  . Visceral obesity 04/08/2012  . Male infertility 03/28/2012  . Immunosuppressive management encounter following kidney transplant 03/24/2012  . Diabetic macular edema ([place]) 01/15/2012  . Diabetes mellitus, type 2 ([place]) 01/15/2012  . IMMUNOCOMPROMISED 01/30/2010  . Diarrhea 01/30/2010  . HYPERPARATHYROIDISM, SECONDARY 01/27/2010  . GOUT 01/27/2010  . Anxiety 01/27/2010  . INSOMNIA 01/27/2010  . HLD (hyperlipidemia) 05/25/2008  . Essential hypertension 05/25/2008    Past Surgical History:  Procedure Laterality Date  . ARTERIOVENOUS GRAFT PLACEMENT Bilateral "several"  . AV FISTULA PLACEMENT Bilateral 2015  . AV FISTULA PLACEMENT Right 06/13/2019   Procedure: INSERTION OF ARTERIOVENOUS (AV) GORE-TEX GRAFT RIGHT  ARM;  Surgeon:  Elam Dutch, MD;  Location: [facility];  Service: Vascular;  Laterality: Right;  . [place] REMOVAL Right 11/08/2020   Procedure: Excision of Foreign Body;  Surgeon: [name], MD;  Location: [facility];  Service: Vascular;  Laterality: Right;  . BASCILIC VEIN TRANSPOSITION Right 09/27/2015  . [place] Right 09/27/2015   Procedure: BASILIC VEIN TRANSPOSITION right;  Surgeon: Mal Misty, MD;  Location: [facility];  Service: Vascular;  Laterality: Right;  . CARDIAC CATHETERIZATION  07/2020  . CATARACT EXTRACTION W/ INTRAOCULAR LENS  IMPLANT, BILATERAL Bilateral   . CORONARY STENT INTERVENTION N/A 07/27/2020   Procedure: CORONARY STENT INTERVENTION;  Surgeon: Burnell Blanks, MD;  Location: [facility];  Service: Cardiovascular;  Laterality: N/A;  . CYSTOSCOPY W/ INTERNAL URETHROTOMY  09/2014  . EYE SURGERY Bilateral 2006  . INSERTION OF DIALYSIS CATHETER N/A 11/08/2020  Procedure: INSERTION OF DIALYSIS CATHETER;  Surgeon: [name], MD;  Location: [facility];  Service: Vascular;  Laterality: N/A;  . IR THROMBECTOMY AV FISTULA W/THROMBOLYSIS/PTA INC/SHUNT/IMG RIGHT Right 08/01/2019  . IR US GUIDE VASC ACCESS RIGHT  08/01/2019  . KIDNEY TRANSPLANT  2009  . LEFT HEART CATH AND CORONARY ANGIOGRAPHY N/A 07/27/2020   Procedure: LEFT HEART CATH AND CORONARY ANGIOGRAPHY;  Surgeon: Burnell Blanks, MD;  Location: [facility] LAB;  Service: Cardiovascular;  Laterality: N/A;  . SIGMOIDOSCOPY    . UPPER EXTREMITY VENOGRAPHY Bilateral 06/02/2019   Procedure: UPPER EXTREMITY VENOGRAPHY;  Surgeon: Elam Dutch, MD;  Location: [facility] CV LAB;  Service: Cardiovascular;  Laterality: Bilateral;  . UPPER GASTROINTESTINAL ENDOSCOPY    . VASCULAR ACCESS DEVICE INSERTION Right 11/07/2020   Procedure: RIGHT UPPER EXTREMITY HERO GRAFT;  Surgeon: [name], MD;  Location[facility];  Service: Vascular;  Laterality: Right;       Family History  Problem Relation  Age of Onset  . Hypertension Mother   . Diabetes Mother   . Diabetes Father   . Diabetes Brother   . Colon cancer Neg Hx   . Colon polyps Neg Hx   . Esophageal cancer Neg Hx   . Rectal cancer Neg Hx   . Stomach cancer Neg Hx     Social History   Tobacco Use  . Smoking status: Never Smoker  . Smokeless tobacco: Never Used  Vaping Use  . Vaping Use: Never used  Substance Use Topics  . Alcohol use: No    Alcohol/week: 0.0 standard drinks  . Drug use: No    Home Medications Prior to Admission medications   Medication Sig Start Date End Date Taking? Authorizing Provider  Accu-Chek FastClix Lancets MISC USE TO CHECK BLOOD SUGARS 4 TIMES A DAY. 08/21/20   Elayne Snare, MD  acetaminophen (TYLENOL) 325 MG tablet Take 2 tablets (650 mg total) by mouth every 4 (four) hours as needed for headache or mild pain. 08/04/20   Richardson Dopp T, PA-C  allopurinol (ZYLOPRIM) 100 MG tablet Take 1 tablet (100 mg total) by mouth 3 (three) times a week. After dialysis Patient taking differently: Take 100 mg by mouth every Tuesday, Thursday, and Saturday at 6 PM. After dialysis 05/17/19   Hoyt Koch, MD  atorvastatin (LIPITOR) 80 MG tablet Take 1 tablet (80 mg total) by mouth daily. 08/04/20 08/04/21  Richardson Dopp T, PA-C  AURYXIA 1 GM 210 MG(Fe) tablet Take 630 mg by mouth 3 (three) times daily with meals. 11/08/19   [provider]  busPIRone (BUSPAR) 5 MG tablet TAKE 1 TABLET (5 MG TOTAL) BY MOUTH 2 (TWO) TIMES DAILY AS NEEDED. Patient taking differently: Take 5 mg by mouth daily as needed (Anxiety). 10/23/20   Hoyt Koch, MD  clonazePAM (KLONOPIN) 0.5 MG tablet Use 1-2 tablets prior to dialysis 3 times weekly. Patient taking differently: Take 1 mg by mouth at bedtime. 10/29/20   Hoyt Koch, MD  clopidogrel (PLAVIX) 75 MG tablet Take 1 tablet (75 mg total) by mouth daily with breakfast. 09/26/20   Burnell Blanks, MD  glucose blood (ACCU-CHEK GUIDE) test  strip USE AS INSTRUCTED TO CHECK BLOOD SUGAR 3 TIMES DAILY AS NEEDED 08/21/20   Elayne Snare, MD  insulin glargine, 2 Unit Dial, (TOUJEO MAX SOLOSTAR) 300 UNIT/ML Solostar Pen INJECT 80 UNITS UNDER THE SKIN IN THE MORNING AND 80 UNITS IN THE EVENING 12/05/20   Elayne Snare, MD  Insulin Pen Needle (DROPLET PEN NEEDLES) 32G X 4 MM MISC USE&nbsp;&nbsp;TO INJECT FIVE TIMES DAILY AS DIRECTED 08/21/20   Elayne Snare, MD  Insulin Pen Needle 32G X 8 MM MISC Use with insulin x5 times a day Patient taking differently: 1 each by Other route See admin instructions. Use with insulin x5 times a day 08/16/19   Elayne Snare, MD  insulin regular human CONCENTRATED (HUMULIN R U-500 KWIKPEN) 500 UNIT/ML kwikpen Inject 50-70 Units into the skin See admin instructions. Inject 50 units ad breakfast , 70 units at lunch, and 60 units at dinner 12/05/20   Elayne Snare, MD  Methoxy PEG-Epoetin Beta (MIRCERA IJ) Mircera 08/15/20 08/14/21  [provider]  midodrine (PROAMATINE) 10 MG tablet Take 10 mg by mouth 3 (three) times daily. 09/27/20   [provider]  omeprazole (PRILOSEC) 20 MG capsule TAKE 1 CAPSULE EVERY DAY Patient taking differently: Take 20 mg by mouth daily. 10/23/20   Hoyt Koch, MD  sevelamer carbonate (RENVELA) 800 MG tablet Take 2,400 mg by mouth in the morning, at noon, and at bedtime. 10/15/20   [provider]  VELTASSA 8.4 g packet Take 8.4 g by mouth daily.  12/30/16   [provider]  warfarin (COUMADIN) 5 MG tablet Take 1 tablet (5 mg total) by mouth See admin instructions. TAKE 1 TABLET EVERY DAY OR AS DIRECTED BY COUMADIN CLINIC. Patient taking differently: Take 5 mg by mouth at bedtime. OR AS DIRECTED BY COUMADIN CLINIC. 08/29/20   Hoyt Koch, MD    Allergies    Pork-derived products, Ace inhibitors, Hydrocodone, and Pork allergy  Review of Systems   Review of Systems  Constitutional: Negative for fever.  HENT: Negative for facial swelling and  trouble swallowing.   Respiratory: Negative for shortness of breath.   Gastrointestinal: Negative for diarrhea.  Skin: Positive for itching and rash.  All other systems reviewed and are negative.   Physical Exam Updated Vital Signs BP (!) 111/58 (BP Location: Left Arm)   Pulse 96   Temp 98.4 F (36.9 C) (Oral)   Resp 15   SpO2 94%   Physical Exam CONSTITUTIONAL: Chronically ill-appearing, anxious HEAD: Normocephalic/atraumatic EYES: EOMI/PERRL ENMT: Mucous membranes dry, no angioedema, no stridor, no drooling NECK: supple no meningeal signs SPINE/BACK:entire spine nontender CV: Tachycardic LUNGS: Lungs are clear to auscultation bilaterally, no apparent distress ABDOMEN: soft, nontender obese NEURO: Pt is awake/alert/appropriate, moves all extremitiesx4.  No facial droop.   EXTREMITIES: pulses normal/equal, full ROM SKIN: warm, color normal, dialysis catheter noted to right upper chest.  Urticaria noted throughout torso.  Chronic rash noted to bilateral lower extremities PSYCH: Anxious ED Results / Procedures / Treatments   Labs (all labs ordered are listed, but only abnormal results are displayed) Labs Reviewed  CBC WITH DIFFERENTIAL/PLATELET - Abnormal; Notable for the following components:      Result Value   RBC 4.03 (*)    Hemoglobin 11.6 (*)    MCHC 29.2 (*)    RDW 17.7 (*)    nRBC 0.3 (*)    All other components within normal limits  COMPREHENSIVE METABOLIC PANEL - Abnormal; Notable for the following components:   Potassium 5.8 (*)    BUN 56 (*)    Creatinine, Ser 9.15 (*)    Albumin 3.1 (*)    Total Bilirubin 1.3 (*)    GFR, Estimated 6 (*)    All other components within normal limits  PROTIME-INR - Abnormal; Notable for the following components:  Prothrombin Time 19.8 (*)    INR 1.8 (*)    All other components within normal limits  CBG MONITORING, ED - Abnormal; Notable for the following components:   Glucose-Capillary 45 (*)    All other components  within normal limits  CBG MONITORING, ED    EKG EKG Interpretation  Date/Time:  Tuesday January 14 2021 00:28:50 EDT Ventricular Rate:  96 PR Interval:    QRS Duration: 129 QT Interval:  396 QTC Calculation: 501 R Axis:   -77 Text Interpretation: Atrial flutter Ventricular premature complex IVCD, consider atypical RBBB Anterolateral infarct, age indeterminate No significant change since last tracing Confirmed by Ripley Fraise [phone]) on 01/14/2021 12:40:29 AM   Radiology No results found.  Procedures Procedures   Medications Ordered in ED Medications  diphenhydrAMINE (BENADRYL) injection 25 mg (25 mg Intravenous Given 01/13/21 2333)  dexamethasone (DECADRON) injection 10 mg (10 mg Intravenous Given 01/13/21 2332)  diphenhydrAMINE (BENADRYL) injection 25 mg (25 mg Intravenous Given 01/14/21 0053)  sodium zirconium cyclosilicate (LOKELMA) packet 10 g (10 g Oral Given 01/14/21 2426)    ED Course  I have reviewed the triage vital signs and the nursing notes.  Pertinent labs results that were available during my care of the patient were reviewed by me and considered in my medical decision making (see chart for details).    MDM Rules/Calculators/A&P                          Patient presents with urticaria after he suspects eating fish.  He has no angioedema, no wheezing, no GI symptoms.  He has diffuse urticaria.  He has been given Benadryl and dexamethasone with some improvement.  Patient is a dialysis patient.  He has mild hyperkalemia, but no acute EKG changes.  He is due for dialysis later today.  We will give him a dose of Lokelma.  I anticipate discharge home.  He is in no respiratory distress 1:54 AM Patient improved, resting comfortably.  Tachycardia is improved.  Blood pressures have remained appropriate.  He is in no respiratory distress.  He will be discharged home.  He request prescription for Benadryl, will add on prednisone for 4 days for his urticaria.  He will follow up  with dialysis at 10 AM later this morning. Advised that if he has any signs of anaphylaxis such as angioedema, shortness of breath he is to call 911 immediately Final Clinical Impression(s) / ED Diagnoses Final diagnoses:  Allergic reaction, initial encounter  Hives  Hyperkalemia    Rx / DC Orders ED Discharge Orders         Ordered    diphenhydrAMINE (BENADRYL) 25 MG tablet  Every 8 hours PRN        01/14/21 0153    predniSONE (DELTASONE) 50 MG tablet        01/14/21 0153           Ripley Fraise, MD 01/14/21 0155

## 2021-01-13 NOTE — ED Triage Notes (Signed)
Emergency Medicine Provider Triage Evaluation Note  [name] [name] , a 55 y.o. male  was evaluated in triage.  Pt complains of concerns of allergic reaction \.  He feels like this is from eating fish last night.  He was given Benadryl p.o. at 1805.  He states that he only feels itchy.  Denies any other allergic related symptoms.    Physical Exam  BP (!) 118/37 (BP Location: Left Arm)   Pulse 96   Temp 98.4 F (36.9 C) (Oral)   Resp 18   SpO2 93%  Patient is awake and alert, answers questions appropriately.  Speech is not slurred.  He is in no obvious distress. Medical Decision Making  Medically screening exam initiated at 6:38 PM.  Appropriate orders placed.  [name] [name] was informed that the remainder of the evaluation will be completed by another provider, this initial triage assessment does not replace that evaluation, and the importance of remaining in the ED until their evaluation is complete.  Clinical Impression  Pruritus   Lorin Glass, [place] 01/13/21 1840

## 2021-01-13 NOTE — ED Notes (Signed)
Pt refusing juice and crackers at this time

## 2021-01-14 ENCOUNTER — Other Ambulatory Visit: Payer: Self-pay

## 2021-01-14 ENCOUNTER — Emergency Department (HOSPITAL_COMMUNITY): Payer: Medicare HMO

## 2021-01-14 ENCOUNTER — Inpatient Hospital Stay (HOSPITAL_COMMUNITY): Payer: Medicare HMO

## 2021-01-14 DIAGNOSIS — I9589 Other hypotension: Secondary | ICD-10-CM | POA: Diagnosis present

## 2021-01-14 DIAGNOSIS — I5020 Unspecified systolic (congestive) heart failure: Secondary | ICD-10-CM | POA: Diagnosis not present

## 2021-01-14 DIAGNOSIS — E1129 Type 2 diabetes mellitus with other diabetic kidney complication: Secondary | ICD-10-CM | POA: Diagnosis not present

## 2021-01-14 DIAGNOSIS — E876 Hypokalemia: Secondary | ICD-10-CM | POA: Diagnosis not present

## 2021-01-14 DIAGNOSIS — Z20822 Contact with and (suspected) exposure to covid-19: Secondary | ICD-10-CM | POA: Diagnosis present

## 2021-01-14 DIAGNOSIS — M79606 Pain in leg, unspecified: Secondary | ICD-10-CM | POA: Diagnosis not present

## 2021-01-14 DIAGNOSIS — I2510 Atherosclerotic heart disease of native coronary artery without angina pectoris: Secondary | ICD-10-CM | POA: Diagnosis present

## 2021-01-14 DIAGNOSIS — E875 Hyperkalemia: Principal | ICD-10-CM

## 2021-01-14 DIAGNOSIS — I351 Nonrheumatic aortic (valve) insufficiency: Secondary | ICD-10-CM | POA: Diagnosis present

## 2021-01-14 DIAGNOSIS — I120 Hypertensive chronic kidney disease with stage 5 chronic kidney disease or end stage renal disease: Secondary | ICD-10-CM | POA: Diagnosis present

## 2021-01-14 DIAGNOSIS — J449 Chronic obstructive pulmonary disease, unspecified: Secondary | ICD-10-CM | POA: Diagnosis present

## 2021-01-14 DIAGNOSIS — Z992 Dependence on renal dialysis: Secondary | ICD-10-CM | POA: Diagnosis not present

## 2021-01-14 DIAGNOSIS — N186 End stage renal disease: Secondary | ICD-10-CM | POA: Diagnosis present

## 2021-01-14 DIAGNOSIS — I468 Cardiac arrest due to other underlying condition: Secondary | ICD-10-CM | POA: Diagnosis present

## 2021-01-14 DIAGNOSIS — E8889 Other specified metabolic disorders: Secondary | ICD-10-CM | POA: Diagnosis present

## 2021-01-14 DIAGNOSIS — D631 Anemia in chronic kidney disease: Secondary | ICD-10-CM | POA: Diagnosis present

## 2021-01-14 DIAGNOSIS — I959 Hypotension, unspecified: Secondary | ICD-10-CM | POA: Diagnosis not present

## 2021-01-14 DIAGNOSIS — T7802XA Anaphylactic reaction due to shellfish (crustaceans), initial encounter: Secondary | ICD-10-CM | POA: Diagnosis present

## 2021-01-14 DIAGNOSIS — L509 Urticaria, unspecified: Secondary | ICD-10-CM

## 2021-01-14 DIAGNOSIS — I480 Paroxysmal atrial fibrillation: Secondary | ICD-10-CM | POA: Diagnosis present

## 2021-01-14 DIAGNOSIS — I25118 Atherosclerotic heart disease of native coronary artery with other forms of angina pectoris: Secondary | ICD-10-CM | POA: Diagnosis not present

## 2021-01-14 DIAGNOSIS — E1122 Type 2 diabetes mellitus with diabetic chronic kidney disease: Secondary | ICD-10-CM | POA: Diagnosis present

## 2021-01-14 DIAGNOSIS — Z6841 Body Mass Index (BMI) 40.0 and over, adult: Secondary | ICD-10-CM | POA: Diagnosis not present

## 2021-01-14 DIAGNOSIS — E1142 Type 2 diabetes mellitus with diabetic polyneuropathy: Secondary | ICD-10-CM | POA: Diagnosis present

## 2021-01-14 DIAGNOSIS — E11311 Type 2 diabetes mellitus with unspecified diabetic retinopathy with macular edema: Secondary | ICD-10-CM | POA: Diagnosis present

## 2021-01-14 DIAGNOSIS — N250 Renal osteodystrophy: Secondary | ICD-10-CM | POA: Diagnosis not present

## 2021-01-14 DIAGNOSIS — E1165 Type 2 diabetes mellitus with hyperglycemia: Secondary | ICD-10-CM | POA: Diagnosis present

## 2021-01-14 DIAGNOSIS — N2581 Secondary hyperparathyroidism of renal origin: Secondary | ICD-10-CM | POA: Diagnosis present

## 2021-01-14 DIAGNOSIS — J810 Acute pulmonary edema: Secondary | ICD-10-CM | POA: Diagnosis not present

## 2021-01-14 DIAGNOSIS — E1143 Type 2 diabetes mellitus with diabetic autonomic (poly)neuropathy: Secondary | ICD-10-CM | POA: Diagnosis present

## 2021-01-14 DIAGNOSIS — T8612 Kidney transplant failure: Secondary | ICD-10-CM | POA: Diagnosis present

## 2021-01-14 DIAGNOSIS — J811 Chronic pulmonary edema: Secondary | ICD-10-CM | POA: Diagnosis not present

## 2021-01-14 DIAGNOSIS — I469 Cardiac arrest, cause unspecified: Secondary | ICD-10-CM | POA: Diagnosis present

## 2021-01-14 DIAGNOSIS — K219 Gastro-esophageal reflux disease without esophagitis: Secondary | ICD-10-CM | POA: Diagnosis present

## 2021-01-14 LAB — COMPREHENSIVE METABOLIC PANEL
ALT: 13 U/L (ref 0–44)
ALT: 18 U/L (ref 0–44)
AST: 26 U/L (ref 15–41)
AST: 25 U/L (ref 15–41)
Albumin: 3.1 g/dL — ABNORMAL LOW (ref 3.5–5.0)
Albumin: 2.9 g/dL — ABNORMAL LOW (ref 3.5–5.0)
Alkaline Phosphatase: 60 U/L (ref 38–126)
Alkaline Phosphatase: 63 U/L (ref 38–126)
Anion gap: 14 (ref 5–15)
Anion gap: 15 (ref 5–15)
BUN: 56 mg/dL — ABNORMAL HIGH (ref 6–20)
BUN: 61 mg/dL — ABNORMAL HIGH (ref 6–20)
CO2: 23 mmol/L (ref 22–32)
CO2: 21 mmol/L — ABNORMAL LOW (ref 22–32)
Calcium: 9.6 mg/dL (ref 8.9–10.3)
Calcium: 9.4 mg/dL (ref 8.9–10.3)
Chloride: 101 mmol/L (ref 98–111)
Chloride: 99 mmol/L (ref 98–111)
Creatinine, Ser: 9.15 mg/dL — ABNORMAL HIGH (ref 0.61–1.24)
Creatinine, Ser: 9.49 mg/dL — ABNORMAL HIGH (ref 0.61–1.24)
GFR, Estimated: 6 mL/min — ABNORMAL LOW (ref >60)
GFR, Estimated: 6 mL/min — ABNORMAL LOW (ref >60)
Glucose, Bld: 71 mg/dL (ref 70–99)
Glucose, Bld: 387 mg/dL — ABNORMAL HIGH (ref 70–99)
Potassium: 5.8 mmol/L — ABNORMAL HIGH (ref 3.5–5.1)
Potassium: 6.9 mmol/L (ref 3.5–5.1)
Sodium: 138 mmol/L (ref 135–145)
Sodium: 135 mmol/L (ref 135–145)
Total Bilirubin: 1.3 mg/dL — ABNORMAL HIGH (ref 0.3–1.2)
Total Bilirubin: 1.2 mg/dL (ref 0.3–1.2)
Total Protein: 7.1 g/dL (ref 6.5–8.1)
Total Protein: 6.9 g/dL (ref 6.5–8.1)

## 2021-01-14 LAB — I-STAT ARTERIAL BLOOD GAS, ED
Acid-base deficit: 1 mmol/L (ref 0.0–2.0)
Bicarbonate: 22.5 mmol/L (ref 20.0–28.0)
Calcium, Ion: 1.19 mmol/L (ref 1.15–1.40)
HCT: 35 % — ABNORMAL LOW (ref 39.0–52.0)
Hemoglobin: 11.9 g/dL — ABNORMAL LOW (ref 13.0–17.0)
O2 Saturation: 99 %
Patient temperature: 97.5
Potassium: 7.1 mmol/L (ref 3.5–5.1)
Sodium: 130 mmol/L — ABNORMAL LOW (ref 135–145)
TCO2: 23 mmol/L (ref 22–32)
pCO2 arterial: 30.8 mmHg — ABNORMAL LOW (ref 32.0–48.0)
pH, Arterial: 7.471 — ABNORMAL HIGH (ref 7.350–7.450)
pO2, Arterial: 114 mmHg — ABNORMAL HIGH (ref 83.0–108.0)

## 2021-01-14 LAB — RENAL FUNCTION PANEL
Albumin: 3.2 g/dL — ABNORMAL LOW (ref 3.5–5.0)
Anion gap: 11 (ref 5–15)
BUN: 63 mg/dL — ABNORMAL HIGH (ref 6–20)
CO2: 24 mmol/L (ref 22–32)
Calcium: 9.2 mg/dL (ref 8.9–10.3)
Chloride: 98 mmol/L (ref 98–111)
Creatinine, Ser: 9.25 mg/dL — ABNORMAL HIGH (ref 0.61–1.24)
GFR, Estimated: 6 mL/min — ABNORMAL LOW (ref >60)
Glucose, Bld: 260 mg/dL — ABNORMAL HIGH (ref 70–99)
Phosphorus: 5.6 mg/dL — ABNORMAL HIGH (ref 2.5–4.6)
Potassium: 6.8 mmol/L (ref 3.5–5.1)
Sodium: 133 mmol/L — ABNORMAL LOW (ref 135–145)

## 2021-01-14 LAB — TROPONIN I (HIGH SENSITIVITY)
Troponin I (High Sensitivity): 683 ng/L (ref <18)
Troponin I (High Sensitivity): 672 ng/L (ref <18)

## 2021-01-14 LAB — HEMOGLOBIN A1C
Hgb A1c MFr Bld: 7.2 % — ABNORMAL HIGH (ref 4.8–5.6)
Mean Plasma Glucose: 159.94 mg/dL

## 2021-01-14 LAB — RESP PANEL BY RT-PCR (FLU A&B, COVID) ARPGX2
Influenza A by PCR: NEGATIVE
Influenza B by PCR: NEGATIVE
SARS Coronavirus 2 by RT PCR: NEGATIVE

## 2021-01-14 LAB — APTT
aPTT: 31 seconds (ref 24–36)
aPTT: 44 seconds — ABNORMAL HIGH (ref 24–36)

## 2021-01-14 LAB — CBC WITH DIFFERENTIAL/PLATELET
Abs Immature Granulocytes: 0.03 10*3/uL (ref 0.00–0.07)
Basophils Absolute: 0 10*3/uL (ref 0.0–0.1)
Basophils Relative: 0 %
Eosinophils Absolute: 0 10*3/uL (ref 0.0–0.5)
Eosinophils Relative: 0 %
HCT: 37.8 % — ABNORMAL LOW (ref 39.0–52.0)
Hemoglobin: 10.8 g/dL — ABNORMAL LOW (ref 13.0–17.0)
Immature Granulocytes: 1 %
Lymphocytes Relative: 10 %
Lymphs Abs: 0.5 10*3/uL — ABNORMAL LOW (ref 0.7–4.0)
MCH: 29 pg (ref 26.0–34.0)
MCHC: 28.6 g/dL — ABNORMAL LOW (ref 30.0–36.0)
MCV: 101.6 fL — ABNORMAL HIGH (ref 80.0–100.0)
Monocytes Absolute: 0.1 10*3/uL (ref 0.1–1.0)
Monocytes Relative: 1 %
Neutro Abs: 4.7 10*3/uL (ref 1.7–7.7)
Neutrophils Relative %: 88 %
Platelets: 250 10*3/uL (ref 150–400)
RBC: 3.72 MIL/uL — ABNORMAL LOW (ref 4.22–5.81)
RDW: 17.7 % — ABNORMAL HIGH (ref 11.5–15.5)
WBC: 5.4 10*3/uL (ref 4.0–10.5)
nRBC: 0 % (ref 0.0–0.2)

## 2021-01-14 LAB — GLUCOSE, CAPILLARY
Glucose-Capillary: 247 mg/dL — ABNORMAL HIGH (ref 70–99)
Glucose-Capillary: 342 mg/dL — ABNORMAL HIGH (ref 70–99)
Glucose-Capillary: 292 mg/dL — ABNORMAL HIGH (ref 70–99)
Glucose-Capillary: 229 mg/dL — ABNORMAL HIGH (ref 70–99)
Glucose-Capillary: 174 mg/dL — ABNORMAL HIGH (ref 70–99)

## 2021-01-14 LAB — CBG MONITORING, ED
Glucose-Capillary: 266 mg/dL — ABNORMAL HIGH (ref 70–99)
Glucose-Capillary: 301 mg/dL — ABNORMAL HIGH (ref 70–99)

## 2021-01-14 LAB — PROTIME-INR
INR: 2 — ABNORMAL HIGH (ref 0.8–1.2)
Prothrombin Time: 21.9 seconds — ABNORMAL HIGH (ref 11.4–15.2)

## 2021-01-14 LAB — MAGNESIUM: Magnesium: 2.3 mg/dL (ref 1.7–2.4)

## 2021-01-14 LAB — PROCALCITONIN: Procalcitonin: 0.73 ng/mL

## 2021-01-14 MED ORDER — DEXTROSE 50 % IV SOLN: 0.0000 mL | INTRAVENOUS | Status: DC | PRN

## 2021-01-14 MED ORDER — CHLORHEXIDINE GLUCONATE CLOTH 2 % EX PADS
6.0000 | MEDICATED_PAD | Freq: Every day | CUTANEOUS | Status: DC
  Administered 2021-01-14 – 2021-01-17 (×4): 6 via TOPICAL

## 2021-01-14 MED ORDER — INSULIN ASPART 100 UNIT/ML ~~LOC~~ SOLN
0.0000 [IU] | SUBCUTANEOUS | Status: DC
  Administered 2021-01-14: 2 [IU] via SUBCUTANEOUS

## 2021-01-14 MED ORDER — SODIUM CHLORIDE 0.9 % IV SOLN
250.0000 mL | INTRAVENOUS | Status: DC
  Administered 2021-01-14: 250 mL via INTRAVENOUS

## 2021-01-14 MED ORDER — PREDNISONE 50 MG PO TABS: ORAL_TABLET | ORAL | 0 refills | Status: DC

## 2021-01-14 MED ORDER — SODIUM CHLORIDE 0.9% FLUSH: 3.0000 mL | INTRAVENOUS | Status: DC | PRN

## 2021-01-14 MED ORDER — SODIUM BICARBONATE 8.4 % IV SOLN
INTRAVENOUS | Status: AC | PRN
  Administered 2021-01-14: 25 meq via INTRAVENOUS

## 2021-01-14 MED ORDER — DIPHENHYDRAMINE-ZINC ACETATE 2-0.1 % EX CREA
TOPICAL_CREAM | Freq: Two times a day (BID) | CUTANEOUS | Status: DC | PRN
  Administered 2021-01-14 (×2): 1 via TOPICAL
  Filled 2021-01-14: qty 28

## 2021-01-14 MED ORDER — PANTOPRAZOLE SODIUM 40 MG IV SOLR
40.0000 mg | Freq: Every day | INTRAVENOUS | Status: DC
  Administered 2021-01-14 – 2021-01-17 (×4): 40 mg via INTRAVENOUS
  Filled 2021-01-14 (×4): qty 40

## 2021-01-14 MED ORDER — SODIUM CHLORIDE 0.9 % IV SOLN: 250.0000 mL | INTRAVENOUS | Status: DC | PRN

## 2021-01-14 MED ORDER — PRISMASOL BGK 0/2.5 32-2.5 MEQ/L REPLACEMENT SOLN
Status: DC
  Filled 2021-01-14 (×5): qty 5000

## 2021-01-14 MED ORDER — CALCIUM CHLORIDE 10 % IV SOLN
INTRAVENOUS | Status: AC | PRN
  Administered 2021-01-14: 1 g via INTRAVENOUS

## 2021-01-14 MED ORDER — SODIUM ZIRCONIUM CYCLOSILICATE 10 G PO PACK
10.0000 g | PACK | Freq: Once | ORAL | Status: AC
  Administered 2021-01-14: 10 g via ORAL
  Filled 2021-01-14: qty 1

## 2021-01-14 MED ORDER — ALTEPLASE 2 MG IJ SOLR
2.0000 mg | Freq: Once | INTRAMUSCULAR | Status: DC | PRN
  Filled 2021-01-14: qty 2

## 2021-01-14 MED ORDER — CLONAZEPAM 0.5 MG PO TABS
0.5000 mg | ORAL_TABLET | Freq: Two times a day (BID) | ORAL | Status: DC
  Administered 2021-01-14 – 2021-01-18 (×8): 0.5 mg via ORAL
  Filled 2021-01-14 (×7): qty 1

## 2021-01-14 MED ORDER — DEXTROSE 50 % IV SOLN
INTRAVENOUS | Status: AC | PRN
  Administered 2021-01-14: 1 via INTRAVENOUS

## 2021-01-14 MED ORDER — CALCIUM GLUCONATE 10 % IV SOLN
1.0000 g | Freq: Once | INTRAVENOUS | Status: AC
  Administered 2021-01-14: 1 g via INTRAVENOUS
  Filled 2021-01-14: qty 10

## 2021-01-14 MED ORDER — DOCUSATE SODIUM 100 MG PO CAPS
100.0000 mg | ORAL_CAPSULE | Freq: Two times a day (BID) | ORAL | Status: DC | PRN
  Administered 2021-01-15 – 2021-01-17 (×2): 100 mg via ORAL
  Filled 2021-01-14 (×2): qty 1

## 2021-01-14 MED ORDER — DIPHENHYDRAMINE HCL 50 MG/ML IJ SOLN
25.0000 mg | Freq: Once | INTRAMUSCULAR | Status: AC
  Administered 2021-01-14: 25 mg via INTRAVENOUS
  Filled 2021-01-14: qty 1

## 2021-01-14 MED ORDER — PRISMASOL BGK 0/2.5 32-2.5 MEQ/L EC SOLN
Status: DC
  Filled 2021-01-14 (×19): qty 5000

## 2021-01-14 MED ORDER — INSULIN REGULAR(HUMAN) IN NACL 100-0.9 UT/100ML-% IV SOLN
INTRAVENOUS | Status: DC
  Administered 2021-01-14: 14 [IU]/h via INTRAVENOUS
  Administered 2021-01-15: 5 [IU]/h via INTRAVENOUS
  Filled 2021-01-14 (×2): qty 100

## 2021-01-14 MED ORDER — SODIUM CHLORIDE 0.9 % IV SOLN: Freq: Once | INTRAVENOUS | Status: AC

## 2021-01-14 MED ORDER — ALBUTEROL SULFATE HFA 108 (90 BASE) MCG/ACT IN AERS
2.0000 | INHALATION_SPRAY | Freq: Four times a day (QID) | RESPIRATORY_TRACT | Status: DC | PRN
  Filled 2021-01-14: qty 6.7

## 2021-01-14 MED ORDER — PRISMASOL BGK 0/2.5 32-2.5 MEQ/L REPLACEMENT SOLN
Status: DC
  Filled 2021-01-14 (×3): qty 5000

## 2021-01-14 MED ORDER — HEPARIN SODIUM (PORCINE) 1000 UNIT/ML DIALYSIS
1000.0000 [IU] | INTRAMUSCULAR | Status: DC | PRN
  Filled 2021-01-14: qty 6

## 2021-01-14 MED ORDER — ANTICOAGULANT SODIUM CITRATE 4% (200MG/5ML) IV SOLN
5.0000 mL | Status: DC | PRN
  Administered 2021-01-16: 5 mL via INTRAVENOUS_CENTRAL
  Filled 2021-01-14 (×2): qty 5

## 2021-01-14 MED ORDER — INSULIN REGULAR BOLUS VIA INFUSION: 10.0000 [IU] | Freq: Once | INTRAVENOUS | Status: DC

## 2021-01-14 MED ORDER — LORAZEPAM 2 MG/ML IJ SOLN
INTRAMUSCULAR | Status: AC
  Filled 2021-01-14: qty 1

## 2021-01-14 MED ORDER — ACETAMINOPHEN 325 MG PO TABS
650.0000 mg | ORAL_TABLET | Freq: Four times a day (QID) | ORAL | Status: DC | PRN
  Administered 2021-01-15 (×2): 650 mg via ORAL
  Filled 2021-01-14 (×2): qty 2

## 2021-01-14 MED ORDER — METHYLPREDNISOLONE SODIUM SUCC 125 MG IJ SOLR
80.0000 mg | Freq: Once | INTRAMUSCULAR | Status: AC
  Administered 2021-01-14: 80 mg via INTRAVENOUS
  Filled 2021-01-14: qty 2

## 2021-01-14 MED ORDER — SODIUM CHLORIDE 0.9 % IV BOLUS
500.0000 mL | Freq: Once | INTRAVENOUS | Status: AC
  Administered 2021-01-14: 500 mL via INTRAVENOUS

## 2021-01-14 MED ORDER — SODIUM CHLORIDE 0.9 % FOR CRRT: INTRAVENOUS_CENTRAL | Status: DC | PRN

## 2021-01-14 MED ORDER — POLYETHYLENE GLYCOL 3350 17 G PO PACK: 17.0000 g | PACK | Freq: Every day | ORAL | Status: DC | PRN

## 2021-01-14 MED ORDER — INSULIN ASPART 100 UNIT/ML ~~LOC~~ SOLN
10.0000 [IU] | Freq: Once | SUBCUTANEOUS | Status: AC
  Administered 2021-01-14: 10 [IU] via SUBCUTANEOUS

## 2021-01-14 MED ORDER — CALCIUM GLUCONATE-NACL 1-0.675 GM/50ML-% IV SOLN: 1.0000 g | Freq: Once | INTRAVENOUS | Status: DC

## 2021-01-14 MED ORDER — DIPHENHYDRAMINE HCL 25 MG PO CAPS
25.0000 mg | ORAL_CAPSULE | Freq: Four times a day (QID) | ORAL | Status: DC | PRN
  Administered 2021-01-14 – 2021-01-18 (×11): 25 mg via ORAL
  Filled 2021-01-14 (×10): qty 1

## 2021-01-14 MED ORDER — ARGATROBAN 50 MG/50ML IV SOLN
0.6000 ug/kg/min | INTRAVENOUS | Status: DC
  Administered 2021-01-14: 0.5 ug/kg/min via INTRAVENOUS
  Administered 2021-01-14 – 2021-01-15 (×2): 0.6 ug/kg/min via INTRAVENOUS
  Filled 2021-01-14 (×4): qty 50

## 2021-01-14 MED ORDER — ALBUTEROL SULFATE HFA 108 (90 BASE) MCG/ACT IN AERS
INHALATION_SPRAY | RESPIRATORY_TRACT | Status: AC
  Filled 2021-01-14: qty 6.7

## 2021-01-14 MED ORDER — SODIUM CHLORIDE 0.9% FLUSH
3.0000 mL | Freq: Two times a day (BID) | INTRAVENOUS | Status: DC
  Administered 2021-01-16 – 2021-01-18 (×4): 3 mL via INTRAVENOUS

## 2021-01-14 MED ORDER — ONDANSETRON HCL 4 MG/2ML IJ SOLN
INTRAMUSCULAR | Status: AC
  Filled 2021-01-14: qty 2

## 2021-01-14 MED ORDER — DIPHENHYDRAMINE HCL 25 MG PO TABS: 25.0000 mg | ORAL_TABLET | Freq: Three times a day (TID) | ORAL | 0 refills | Status: DC | PRN

## 2021-01-14 NOTE — ED Notes (Signed)
Pt has d/c paperwork in hand given by previous RN. Pt is alert& oriented. Pt understand paperwork . Pt states his ride is on the way and he would like to wait in the lobby for ride. VSS updated

## 2021-01-14 NOTE — ED Provider Notes (Signed)
  Briefly this patient was awaiting discharge to dialysis when there was an apparent respiratory and cardiac arrest.  The patient had been evaluated overnight for allergic reaction and was completely stable last night.  Potassium was seen to be slightly elevated but no concern for hyperkalemia however through the evening the patient was awaiting for his ride this morning and had apparent loss of consciousness, found to be pulseless and apneic.  Bag-valve-mask, CPR was initiated which I directed and participated in for approximately 5 minutes, I inserted an intraosseous line and multiple medications were given including calcium bicarbonate and insulin, D50, Ativan as the patient became agitated as he came back around with return of spontaneous regulation.  Patient is critically ill with hyperkalemia, please see note from Dr. Dina Rich physician assuming care and coordinating critical care efforts / admission.  IO LINE INSERTION  Date/Time: 01/14/2021 8:39 AM Performed by: Noemi Chapel, MD Authorized by: Noemi Chapel, MD   Consent:    Consent obtained:  Emergent situation Pre-procedure details:    Site preparation:  Alcohol Anesthesia:    Anesthesia method:  None Procedure details:    Insertion site:  L proximal tibia   Insertion device:  18 gauge IO needle and drill device   Insertion: Needle was inserted through the bony cortex     Number of attempts:  1   Insertion confirmation:  Aspiration of blood/marrow, easy infusion of fluids and stability of the needle Post-procedure details:    Secured with:  Transparent dressing   Procedure completion:  Tolerated well, no immediate complications Comments:       CPR  Date/Time: 01/14/2021 8:39 AM Performed by: Noemi Chapel, MD Authorized by: Noemi Chapel, MD  CPR Procedure Details:      Amount of time prior to administration of ACLS/BLS (minutes):  0   ACLS/BLS initiated by EMS: No     CPR/ACLS performed in the ED: Yes     Duration of CPR  (minutes):  5   Outcome: ROSC obtained    CPR performed via ACLS guidelines under my direct supervision.  See RN documentation for details including defibrillator use, medications, doses and timing. Comments:     5 minutes of CPR which I personally performed and directed, patient on return of spontaneous circulation      Noemi Chapel, MD 01/14/21 [phone]

## 2021-01-14 NOTE — H&P (Signed)
NAMEAntoinette [name], MRN:  [id number], DOB:  July 05, 1966, LOS: 0 ADMISSION DATE:  01/13/2021, CONSULTATION DATE: 01/14/2021 REFERRING MD: Emergency department physician, CHIEF COMPLAINT: Status post cardiac arrest on 01/15/2019  History of Present Illness:  55 year old with a plethora of health issues that include coronary artery disease status post MI, end-stage renal disease with multiple failed AV grafts currently requiring right IJ tunneled catheter, diabetes mellitus poorly controlled, morbid obesity at 286 pounds who was in his usual state of poor health on 01/13/2021 when he arrived to the emergency room with suspected reaction to shellfish.  He received standard protocol for anaphylactic reaction consisting of steroids H2 blockers for which he responded.  He is due for dialysis on 01/14/2021 was being ready for transport out of the emergency room at which time he coded.  Coded for less than 5 minutes requiring CPR did not require intubation or cardioversion.  He is awake and alert but does have elevated potassium and due to his extensive past medical history pulmonary critical care was asked to admit.  He did find his repeat potassium was 6.9 nephrology was informed of this.  You most likely require urgent hemodialysis versus CRRT.  He does have a tunneled catheter right internal jugular vein.  He is noted to be hypotensive although accurate blood pressures are very difficult to obtain.  He will be admitted to the intensive care unit for further evaluation and treatment  Pertinent  Medical History   Past Medical History:  Diagnosis Date  . Allergic rhinitis   . Allergy   . Anemia   . Aortic insufficiency    SBE in 2016 >> pt declined AVR // a. Echo 7/16:  Mod LVH, EF 50-55%, mild to mod AI, severe LAE, PASP 57 mmHg (LV ID end diastolic 32.2 mm);  b. Echo 2/17:  Mod LVH, EF 50-55%, mod AI, MAC, mild MR, mod LAE, mild to mod TR, PASP 63 mmHg // Echo 2/21: normal EF, mild MR, mild AI  . Atrial  fibrillation and flutter ([place])    chronic anticoag with Warfarin // bradycardia noted during admit 10/21  . Bilateral carpal tunnel syndrome   . Bradycardia    brady/low BP event during admit in 10/21 >> poor candidate for pacer/EPS due to high risk of infection with ESRD; pacer not recommended by EP  . CAD (coronary artery disease)    s/p NSTEMI 10/21: cath>> pLAD 99 >> tx with DES; Lat RI 100 and OM1 100 - med Rx  . Cataract    removed both eyes   . Cholecystitis    dx during admx 10/21 >> HIDA neg // exam benign // high risk for surgery >> no surgical intervention recommended.   . Claustrophobia   . COPD (chronic obstructive pulmonary disease) ([place])   . Coronary artery disease   . Diabetes mellitus    Type II  . Diabetic peripheral neuropathy ([place])   . Diabetic retinopathy ([place])   . Discitis of lumbar region    resolved  . DVT (deep venous thrombosis) ([place]) 10/07/2015   LLE  . Dysrhythmia 07/2020   afib  . Endocarditis 11/28/14  . ESRD (end stage renal disease) on dialysis [facility])    s/p renal transplant in 2009, failed in 2016  . Gait disorder   . Gastroparesis   . GERD (gastroesophageal reflux disease)   . Gout   . Hearing difficulty   . Hemodialysis patient ([place])    tuesday, thursday, saturday   . HFrEF (  HF with reduced EF)    Ischemic CM // Echo 10/21: EF 40-45, dist sept and apical AK, mild LVH, normal RVSF, mod MAC  . Hyperlipidemia   . Hypertension   . Incomplete bladder emptying   . Leg pain 02/05/11   with walking  . Morbid obesity ([place])   . Myocardial infarction ([place]) 07/2020  . Obstructive sleep apnea    not using CPAP- lost 100lbs  . Osteomyelitis ([place])   . Posttraumatic stress disorder   . Rotator cuff disorder   . Secondary hyperparathyroidism ([place])   . Sepsis ([place])    Postive blood cultures -   . Sleep apnea    no cpap  . Urethral stricture   . Vitamin D deficiency      Significant Hospital Events: Including procedures, antibiotic start and  stop dates in addition to other pertinent events   .  01/14/2021 status post cardiac arrest with return of spontaneous circulation respirations with involvement Interim History / Subjective:  55 year old with a plethora of health issues originally admitted with rash from suspected reaction to shellfish just prior to discharge had a cardiac arrest approximately 5 minutes  Objective   Blood pressure (!) 78/64, pulse (!) 107, temperature 98.3 F (36.8 C), temperature source Oral, resp. rate (!) 26, SpO2 99 %.       No intake or output data in the 24 hours ending 01/14/21 1010 There were no vitals filed for this visit.  Examination: General: Morbidly obese male who is awake alert no acute distress HENT: No neck.  Pupils equal react to light Lungs: Decreased breath sounds in the base, FiO2 of 2 L with sats 100% Cardiovascular: Heart sounds are regular Abdomen: Obese with bowel Extremities: Lower extremities with 1+ edema Neuro: Grossly intact without focal defect  Labs/imaging that I havepersonally reviewed  (right click and "Reselect all SmartList Selections" daily)  Have reviewed all labs  Resolved Hospital Problem list     Assessment & Plan:  Status post cardiac arrest 01/14/2021 at 8:30 AM with prompt return of pulse and blood pressure.  Note has extensive coronary artery disease history with acute MI in the past. Admit to intensive care Cardiology consult Cycle troponins Stat 2D echo Lower extremity Doppler studies Repeat electrolytes noted to be hyperkalemic earlier may need urgent hemodialysis if potassium is elevated. Questionable blood pressures are accurate Consideration for arterial line  Extensive past medical history of coronary artery disease, PE and multiple vascular interventions for development of AV fistulas. Cardiologist called May need vascular surgery intervention in future for repair of AV graft.  End-stage renal disease with electrolyte abnormalities Recent  Labs  Lab 01/13/21 2311 01/14/21 0856  K 5.8* 6.9*    Renal has been counseled Potassium was noted to be 6.9 postcode will need hemodialysis versus CRRT Insulin 10 units IV given   Diabetes mellitus CBG (last 3)  Recent Labs    01/13/21 2238 01/14/21 0655 01/14/21 0822  GLUCAP 70 266* 301*   Scale insulin coverage Holding home insulin at this time  History obstructive sleep apnea Check arterial blood gas for completeness Nocturnal CPAP  Best practice (right click and "Reselect all SmartList Selections" daily)  Diet:   Pain/Anxiety/Delirium protocol (if indicated): No VAP protocol (if indicated): Not indicated DVT prophylaxis: Systemic AC GI prophylaxis: PPI Glucose control:  SSI Yes Central venous access:  Yes, and it is still needed Arterial line:  N/A Foley:  N/A Mobility:  bed rest  PT consulted: Yes Last date of  multidisciplinary goals of care discussion [ending] Code Status:  full code Disposition: Admit to intensive care unit  Labs   CBC: Recent Labs  Lab 01/13/21 2311 01/14/21 0856  WBC 6.0 5.4  NEUTROABS 4.0 4.7  HGB 11.6* 10.8*  HCT 39.7 37.8*  MCV 98.5 101.6*  PLT 281 119    Basic Metabolic Panel: Recent Labs  Lab 01/13/21 2311 01/14/21 0856  NA 138 135  K 5.8* 6.9*  CL 101 99  CO2 23 21*  GLUCOSE 71 387*  BUN 56* 61*  CREATININE 9.15* 9.49*  CALCIUM 9.6 9.4  MG  --  2.3   GFR: CrCl cannot be calculated (Unknown ideal weight.). Recent Labs  Lab 01/13/21 2311 01/14/21 0856  WBC 6.0 5.4    Liver Function Tests: Recent Labs  Lab 01/13/21 2311 01/14/21 0856  AST 26 25  ALT 13 18  ALKPHOS 60 63  BILITOT 1.3* 1.2  PROT 7.1 6.9  ALBUMIN 3.1* 2.9*   No results for input(s): LIPASE, AMYLASE in the last 168 hours. No results for input(s): AMMONIA in the last 168 hours.  ABG    Component Value Date/Time   PHART 7.301 (L) 10/14/2015 1821   PCO2ART 55.1 (H) 10/14/2015 1821   PO2ART 100.0 10/14/2015 1821   HCO3 27.2  (H) 10/14/2015 1821   TCO2 22 11/08/2020 1244   ACIDBASEDEF 4.6 (H) 10/13/2015 1420   O2SAT 97.0 10/14/2015 1821     Coagulation Profile: Recent Labs  Lab 01/09/21 0000 01/13/21 2326  INR 2.4 1.8*    Cardiac Enzymes: No results for input(s): CKTOTAL, CKMB, CKMBINDEX, TROPONINI in the last 168 hours.  HbA1C: Hgb A1c MFr Bld  Date/Time Value Ref Range Status  11/09/2020 03:13 AM 6.7 (H) 4.8 - 5.6 % Final    Comment:    (NOTE) Pre diabetes:          5.7%-6.4%  Diabetes:              >6.4%  Glycemic control for   <7.0% adults with diabetes   08/19/2020 02:27 PM 7.2 (H) 4.6 - 6.5 % Final    Comment:    Glycemic Control Guidelines for People with Diabetes:Non Diabetic:  <6%Goal of Therapy: <7%Additional Action Suggested:  >8%     CBG: Recent Labs  Lab 01/13/21 2200 01/13/21 2238 01/14/21 0655 01/14/21 0822  GLUCAP 45* 70 266* 301*    Review of Systems:   Taken extensively.  Denies shortness of breath and chest pain at this time.  He does endorse tenderness of the chest where he received CPR.  Otherwise see HPI for further details.  Past Medical History:  He,  has a past medical history of Allergic rhinitis, Allergy, Anemia, Aortic insufficiency, Atrial fibrillation and flutter ([place]), Bilateral carpal tunnel syndrome, Bradycardia, CAD (coronary artery disease), Cataract, Cholecystitis, Claustrophobia, COPD (chronic obstructive pulmonary disease) ([place]), Coronary artery disease, Diabetes mellitus, Diabetic peripheral neuropathy ([place]), Diabetic retinopathy ([place]), Discitis of lumbar region, DVT (deep venous thrombosis) ([place]) (10/07/2015), Dysrhythmia (07/2020), Endocarditis (11/28/14), ESRD (end stage renal disease) on dialysis [facility]), Gait disorder, Gastroparesis, GERD (gastroesophageal reflux disease), Gout, Hearing difficulty, Hemodialysis patient ([place]), HFrEF (HF with reduced EF), Hyperlipidemia, Hypertension, Incomplete bladder emptying, Leg pain (02/05/11), Morbid obesity  ([place]), Myocardial infarction ([place]) (07/2020), Obstructive sleep apnea, Osteomyelitis ([place]), Posttraumatic stress disorder, Rotator cuff disorder, Secondary hyperparathyroidism ([place]), Sepsis ([place]), Sleep apnea, Urethral stricture, and Vitamin D deficiency.   Surgical History:   Past Surgical History:  Procedure Laterality Date  . ARTERIOVENOUS GRAFT PLACEMENT  Bilateral "several"  . AV FISTULA PLACEMENT Bilateral 2015  . AV FISTULA PLACEMENT Right 06/13/2019   Procedure: INSERTION OF ARTERIOVENOUS (AV) GORE-TEX GRAFT RIGHT  ARM;  Surgeon: Elam Dutch, MD;  Location: [facility];  Service: Vascular;  Laterality: Right;  . [place] REMOVAL Right 11/08/2020   Procedure: Excision of Foreign Body;  Surgeon: [name], MD;  Location: [place];  Service: Vascular;  Laterality: Right;  . BASCILIC VEIN TRANSPOSITION Right 09/27/2015  . [place] Right 09/27/2015   Procedure: BASILIC VEIN TRANSPOSITION right;  Surgeon: Mal Misty, MD;  Location: [place];  Service: Vascular;  Laterality: Right;  . CARDIAC CATHETERIZATION  07/2020  . CATARACT EXTRACTION W/ INTRAOCULAR LENS  IMPLANT, BILATERAL Bilateral   . CORONARY STENT INTERVENTION N/A 07/27/2020   Procedure: CORONARY STENT INTERVENTION;  Surgeon: Burnell Blanks, MD;  Location: [place] [facility];  Service: Cardiovascular;  Laterality: N/A;  . CYSTOSCOPY W/ INTERNAL URETHROTOMY  09/2014  . EYE SURGERY Bilateral 2006  . INSERTION OF DIALYSIS CATHETER N/A 11/08/2020   Procedure: INSERTION OF DIALYSIS CATHETER;  Surgeon: [name], MD;  Location: [place];  Service: Vascular;  Laterality: N/A;  . IR THROMBECTOMY AV FISTULA W/THROMBOLYSIS/PTA INC/SHUNT/IMG RIGHT Right 08/01/2019  . IR US GUIDE VASC ACCESS RIGHT  08/01/2019  . KIDNEY TRANSPLANT  2009  . LEFT HEART CATH AND CORONARY ANGIOGRAPHY N/A 07/27/2020   Procedure: LEFT HEART CATH AND CORONARY ANGIOGRAPHY;  Surgeon: Burnell Blanks, MD;  Location: [place] [facility]  [facility];  Service: Cardiovascular;  Laterality: N/A;  . SIGMOIDOSCOPY    . UPPER EXTREMITY VENOGRAPHY Bilateral 06/02/2019   Procedure: UPPER EXTREMITY VENOGRAPHY;  Surgeon: Elam Dutch, MD;  Location: [place] [facility];  Service: Cardiovascular;  Laterality: Bilateral;  . UPPER GASTROINTESTINAL ENDOSCOPY    . VASCULAR ACCESS DEVICE INSERTION Right 11/07/2020   Procedure: RIGHT UPPER EXTREMITY HERO GRAFT;  Surgeon: [name], MD;  Location: [place];  Service: Vascular;  Laterality: Right;     Social History:   reports that he has never smoked. He has never used smokeless tobacco. He reports that he does not drink alcohol and does not use drugs.   Family History:  His family history includes Diabetes in his brother, father, and mother; Hypertension in his mother. There is no history of Colon cancer, Colon polyps, Esophageal cancer, Rectal cancer, or Stomach cancer.   Allergies Allergies  Allergen Reactions  . Pork-Derived Products Anaphylaxis and Other (See Comments)    Fever also   . Ace Inhibitors Cough  . Hydrocodone Other (See Comments)    Hallucinations  . Pork Allergy     Other reaction(s): Unknown     Home Medications  Prior to Admission medications   Medication Sig Start Date End Date Taking? Authorizing Provider  diphenhydrAMINE (BENADRYL) 25 MG tablet Take 1 tablet (25 mg total) by mouth every 8 (eight) hours as needed for itching. 01/14/21  Yes Ripley Fraise, MD  predniSONE (DELTASONE) 50 MG tablet 1 tablet PO QD X4 days 01/14/21  Yes Ripley Fraise, MD  Accu-Chek FastClix Lancets MISC USE TO CHECK BLOOD SUGARS 4 TIMES A DAY. 08/21/20   Elayne Snare, MD  acetaminophen (TYLENOL) 325 MG tablet Take 2 tablets (650 mg total) by mouth every 4 (four) hours as needed for headache or mild pain. 08/04/20   Richardson Dopp T, PA-C  allopurinol (ZYLOPRIM) 100 MG tablet Take 1 tablet (100 mg total) by mouth 3 (three) times a week.  After dialysis Patient taking differently: Take  100 mg by mouth every Tuesday, Thursday, and Saturday at 6 PM. After dialysis 05/17/19   Hoyt Koch, MD  atorvastatin (LIPITOR) 80 MG tablet Take 1 tablet (80 mg total) by mouth daily. 08/04/20 08/04/21  Richardson Dopp T, PA-C  AURYXIA 1 GM 210 MG(Fe) tablet Take 630 mg by mouth 3 (three) times daily with meals. 11/08/19   [provider]  busPIRone (BUSPAR) 5 MG tablet TAKE 1 TABLET TWICE DAILY AS NEEDED 01/14/21   Hoyt Koch, MD  clonazePAM (KLONOPIN) 0.5 MG tablet Use 1-2 tablets prior to dialysis 3 times weekly. Patient taking differently: Take 1 mg by mouth at bedtime. 10/29/20   Hoyt Koch, MD  clopidogrel (PLAVIX) 75 MG tablet Take 1 tablet (75 mg total) by mouth daily with breakfast. 09/26/20   Burnell Blanks, MD  glucose blood (ACCU-CHEK GUIDE) test strip USE AS INSTRUCTED TO CHECK BLOOD SUGAR 3 TIMES DAILY AS NEEDED 08/21/20   Elayne Snare, MD  insulin glargine, 2 Unit Dial, (TOUJEO MAX SOLOSTAR) 300 UNIT/ML Solostar Pen INJECT 80 UNITS UNDER THE SKIN IN THE MORNING AND 80 UNITS IN THE EVENING 12/05/20   Elayne Snare, MD  Insulin Pen Needle (DROPLET PEN NEEDLES) 32G X 4 MM MISC USE&nbsp;&nbsp;TO INJECT FIVE TIMES DAILY AS DIRECTED 08/21/20   Elayne Snare, MD  Insulin Pen Needle 32G X 8 MM MISC Use with insulin x5 times a day Patient taking differently: 1 each by Other route See admin instructions. Use with insulin x5 times a day 08/16/19   Elayne Snare, MD  insulin regular human CONCENTRATED (HUMULIN R U-500 KWIKPEN) 500 UNIT/ML kwikpen Inject 50-70 Units into the skin See admin instructions. Inject 50 units ad breakfast , 70 units at lunch, and 60 units at dinner 12/05/20   Elayne Snare, MD  Methoxy PEG-Epoetin Beta (MIRCERA IJ) Mircera 08/15/20 08/14/21  [provider]  midodrine (PROAMATINE) 10 MG tablet Take 10 mg by mouth 3 (three) times daily. 09/27/20   [provider]  omeprazole (PRILOSEC) 20 MG capsule TAKE 1 CAPSULE EVERY DAY  01/14/21   Hoyt Koch, MD  sevelamer carbonate (RENVELA) 800 MG tablet Take 2,400 mg by mouth in the morning, at noon, and at bedtime. 10/15/20   [provider]  VELTASSA 8.4 g packet Take 8.4 g by mouth daily.  12/30/16   [provider]  warfarin (COUMADIN) 5 MG tablet Take 1 tablet (5 mg total) by mouth See admin instructions. TAKE 1 TABLET EVERY DAY OR AS DIRECTED BY COUMADIN CLINIC. Patient taking differently: Take 5 mg by mouth at bedtime. OR AS DIRECTED BY COUMADIN CLINIC. 08/29/20   Hoyt Koch, MD     Critical care time: 70 min     Richardson Landry Domanique Luckett ACNP Acute Care Nurse Practitioner [place] Please consult Amion 01/14/2021, 10:11 AM

## 2021-01-14 NOTE — ED Notes (Signed)
Pt requesting for the air to be turned on due to being extremely hot.

## 2021-01-14 NOTE — ED Notes (Signed)
Received pt from [facility] / RN, Marcie Bal to resusc room after pt coded. Pt alert with VS stable on arrival to RM.

## 2021-01-14 NOTE — ED Notes (Signed)
Pt is sitting on the side of the bed getting dressed.

## 2021-01-14 NOTE — ED Notes (Addendum)
Meds given by RN's in the room helping with code

## 2021-01-14 NOTE — Progress Notes (Signed)
See full note to follow.  Pt here for allergic reaction c/b shock and cardiac arrest, likely anaphylaxis +/- hyperkalemia. Pt is too unstable for regular dialysis today and has an HD cath already so will plan for CRRT w/ low K+ bath.   Kelly Splinter, MD 01/14/2021, 12:31 PM

## 2021-01-14 NOTE — ED Notes (Signed)
10 units of insulin given to during code for hyperK

## 2021-01-14 NOTE — Progress Notes (Signed)
[facility] Progress Note Patient Name: [name] [name] DOB: 1966-03-30 MRN: [id number]   Date of Service  01/14/2021  HPI/Events of Note  Multiple issues: 1. Hyperglycemia - Blood glucose = 301 --> 247 --> 342 and 2. Anxiety - Patient requests home Klonopin.   eICU Interventions  Plan: 1. D/C Q 4 hour Novolog SSI. 2. Insulin IV infusion per [employer] protocol. 3. Klonopin 0.5 mg PO BID.     Intervention Category Major Interventions: Hyperglycemia - active titration of insulin therapy;Delirium, psychosis, severe agitation - evaluation and management  Eual Lindstrom Eugene 01/14/2021, 8:30 PM

## 2021-01-14 NOTE — ED Notes (Signed)
Pt states he eats 3 dates daily for breakfast

## 2021-01-14 NOTE — Progress Notes (Signed)
[facility] for Argatroban Indication: chest pain/ACS  Allergies  Allergen Reactions  . Pork-Derived Products Anaphylaxis and Other (See Comments)    Fever also   . Ace Inhibitors Cough  . Hydrocodone Other (See Comments)    Hallucinations    Patient Measurements: Height: _0  (177.8 cm) Weight: (!) 137.9 kg (304 lb 0.2 oz) IBW/kg (Calculated) : 73 Heparin Dosing Weight: 105.2 kg  Vital Signs: Temp: 97.5 F (36.4 C) (04/05 1027) Temp Source: Oral (04/05 1027) BP: 93/57 (04/05 1600) Pulse Rate: 100 (04/05 1100)  Labs: Recent Labs    01/13/21 2311 01/13/21 2326 01/14/21 0856 01/14/21 0944 01/14/21 1018 01/14/21 1052 01/14/21 1158 01/14/21 1538  HGB 11.6*  --  10.8*  --   --  11.9*  --   --   HCT 39.7  --  37.8*  --   --  35.0*  --   --   PLT 281  --  250  --   --   --   --   --   APTT  --   --   --   --  31  --   --  44*  LABPROT  --  19.8*  --   --  21.9*  --   --   --   INR  --  1.8*  --   --  2.0*  --   --   --   CREATININE 9.15*  --  9.49*  --   --   --   --   --   TROPONINIHS  --   --   --  683*  --   --  672*  --     Estimated Creatinine Clearance: 12 mL/min (A) (by C-G formula based on SCr of 9.49 mg/dL (H)).   Medical History: Past Medical History:  Diagnosis Date  . Allergic rhinitis   . Allergy   . Anemia   . Aortic insufficiency    SBE in 2016 >> pt declined AVR // a. Echo 7/16:  Mod LVH, EF 50-55%, mild to mod AI, severe LAE, PASP 57 mmHg (LV ID end diastolic 95.1 mm);  b. Echo 2/17:  Mod LVH, EF 50-55%, mod AI, MAC, mild MR, mod LAE, mild to mod TR, PASP 63 mmHg // Echo 2/21: normal EF, mild MR, mild AI  . Atrial fibrillation and flutter ([place])    chronic anticoag with Warfarin // bradycardia noted during admit 10/21  . Bilateral carpal tunnel syndrome   . Bradycardia    brady/low BP event during admit in 10/21 >> poor candidate for pacer/EPS due to high risk of infection with ESRD; pacer not recommended by  EP  . CAD (coronary artery disease)    s/p NSTEMI 10/21: cath>> pLAD 99 >> tx with DES; Lat RI 100 and OM1 100 - med Rx  . Cataract    removed both eyes   . Cholecystitis    dx during admx 10/21 >> HIDA neg // exam benign // high risk for surgery >> no surgical intervention recommended.   . Claustrophobia   . COPD (chronic obstructive pulmonary disease) ([place])   . Coronary artery disease   . Diabetes mellitus    Type II  . Diabetic peripheral neuropathy ([place])   . Diabetic retinopathy ([place])   . Discitis of lumbar region    resolved  . DVT (deep venous thrombosis) ([place]) 10/07/2015   LLE  . Dysrhythmia 07/2020   afib  .  Endocarditis 11/28/14  . ESRD (end stage renal disease) on dialysis [facility])    s/p renal transplant in 2009, failed in 2016  . Gait disorder   . Gastroparesis   . GERD (gastroesophageal reflux disease)   . Gout   . Hearing difficulty   . Hemodialysis patient ([place])    tuesday, thursday, saturday   . HFrEF (HF with reduced EF)    Ischemic CM // Echo 10/21: EF 40-45, dist sept and apical AK, mild LVH, normal RVSF, mod MAC  . Hyperlipidemia   . Hypertension   . Incomplete bladder emptying   . Leg pain 02/05/11   with walking  . Morbid obesity ([place])   . Myocardial infarction ([place]) 07/2020  . Obstructive sleep apnea    not using CPAP- lost 100lbs  . Osteomyelitis ([place])   . Posttraumatic stress disorder   . Rotator cuff disorder   . Secondary hyperparathyroidism ([place])   . Sepsis ([place])    Postive blood cultures -   . Sleep apnea    no cpap  . Urethral stricture   . Vitamin D deficiency       Assessment: 55 yo male s/p cardiac arrest in ED this morning. Pharmacy consulted for heparin for ACS/chest pain also possible suspicion of PE. Patient is on warfarin prior to admission for AFib - last dose at 1800 prior to ED presentation per patient report though unable to gather home regimen per patient. INR 1.8 last night with INR in process now.   Patient with  anaphylaxis allergy to pork. Will use argatroban instead of heparin.   Initial aptt below goal at 44s. No issues with infusion or bleeding noted, will make rate adjustment.    Goal of Therapy:  APTT 50-90 Monitor platelets by anticoagulation protocol: Yes   Plan:   Increase argatroban to 0.35mg/kg/min Check aPTT in 4 hrs  Monitor INR, aPTT, CBC and s/s of bleeding daily    FErin HearingPharmD., BCPS Clinical Pharmacist 01/14/2021 6:15 PM

## 2021-01-14 NOTE — CV Procedure (Signed)
BLE venous duplex completed.  Results can be found under chart review under CV PROC. 01/14/2021 2:01 PM Bianey Tesoro RVT, RDMS

## 2021-01-14 NOTE — Consult Note (Signed)
[facility] Renal Consultation Note    Indication for Consultation:  Management of ESRD/hemodialysis, anemia, hypertension/volume, and secondary hyperparathyroidism. PCP:  HPI: [name] [name] is a 55 y.o. male with ESRD, CAD s/p stents, T2DM, chronic hypotension (on midodrine), HFrEF, COPD, OSA who was admitted s/p cardiac arrest while in ED.  Presented to ED yesterday evening with concern of rash and itching which started at his head and progressed down his back. This was after eating fish and cheese, which he has eaten many times without issues. In the ED, allergic reaction was suspected and urticarial rash noted. He was given prednisone and benadryl with improved symptoms. K 5.8 but was scheduled for dialysis today and so plan was to go to his outpatient HD. Given Lokelma 10g dose and discharged. While getting dressed to leave, he lost consciousness and noted to be pulseless. CPR started, given less than 2 minutes without shocks or Epi before ROSC. He was treated medically for hyperkalemia with CaGlu, D50/insulin, bicarb.  At time of our visit, he was awake/oriented. Using nasal O2 but without dyspnea. He denied throat swelling or CP. He was hypotensive - was being transferred to [facility].  Repeat K now 6.9. Got another dose CaGlu and started on IVF.  Dialyzes on TTS schedule at [facility]. Last HD was Sat 4/2. He uses  R IJ TDC as access s/p clotted RUE HeRO AVG.  Past Medical History:  Diagnosis Date  . Allergic rhinitis   . Allergy   . Anemia   . Aortic insufficiency    SBE in 2016 >> pt declined AVR // a. Echo 7/16:  Mod LVH, EF 50-55%, mild to mod AI, severe LAE, PASP 57 mmHg (LV ID end diastolic 71.6 mm);  b. Echo 2/17:  Mod LVH, EF 50-55%, mod AI, MAC, mild MR, mod LAE, mild to mod TR, PASP 63 mmHg // Echo 2/21: normal EF, mild MR, mild AI  . Atrial fibrillation and flutter ([place])    chronic anticoag with Warfarin // bradycardia noted during admit 10/21  . Bilateral carpal  tunnel syndrome   . Bradycardia    brady/low BP event during admit in 10/21 >> poor candidate for pacer/EPS due to high risk of infection with ESRD; pacer not recommended by EP  . CAD (coronary artery disease)    s/p NSTEMI 10/21: cath>> pLAD 99 >> tx with DES; Lat RI 100 and OM1 100 - med Rx  . Cataract    removed both eyes   . Cholecystitis    dx during admx 10/21 >> HIDA neg // exam benign // high risk for surgery >> no surgical intervention recommended.   . Claustrophobia   . COPD (chronic obstructive pulmonary disease) ([place])   . Coronary artery disease   . Diabetes mellitus    Type II  . Diabetic peripheral neuropathy ([place])   . Diabetic retinopathy ([place])   . Discitis of lumbar region    resolved  . DVT (deep venous thrombosis) ([place]) 10/07/2015   LLE  . Dysrhythmia 07/2020   afib  . Endocarditis 11/28/14  . ESRD (end stage renal disease) on dialysis [facility])    s/p renal transplant in 2009, failed in 2016  . Gait disorder   . Gastroparesis   . GERD (gastroesophageal reflux disease)   . Gout   . Hearing difficulty   . Hemodialysis patient ([place])    tuesday, thursday, saturday   . HFrEF (HF with reduced EF)    Ischemic CM // Echo 10/21: EF 40-45,  dist sept and apical AK, mild LVH, normal RVSF, mod MAC  . Hyperlipidemia   . Hypertension   . Incomplete bladder emptying   . Leg pain 02/05/11   with walking  . Morbid obesity ([place])   . Myocardial infarction ([place]) 07/2020  . Obstructive sleep apnea    not using CPAP- lost 100lbs  . Osteomyelitis ([place])   . Posttraumatic stress disorder   . Rotator cuff disorder   . Secondary hyperparathyroidism ([place])   . Sepsis ([place])    Postive blood cultures -   . Sleep apnea    no cpap  . Urethral stricture   . Vitamin D deficiency    Past Surgical History:  Procedure Laterality Date  . ARTERIOVENOUS GRAFT PLACEMENT Bilateral "several"  . AV FISTULA PLACEMENT Bilateral 2015  . AV FISTULA PLACEMENT Right 06/13/2019   Procedure:  INSERTION OF ARTERIOVENOUS (AV) GORE-TEX GRAFT RIGHT  ARM;  Surgeon: Elam Dutch, MD;  Location: [place];  Service: Vascular;  Laterality: Right;  . [place] REMOVAL Right 11/08/2020   Procedure: Excision of Foreign Body;  Surgeon: [name], MD;  Location: [place];  Service: Vascular;  Laterality: Right;  . BASCILIC VEIN TRANSPOSITION Right 09/27/2015  . [place] Right 09/27/2015   Procedure: BASILIC VEIN TRANSPOSITION right;  Surgeon: Mal Misty, MD;  Location: [place];  Service: Vascular;  Laterality: Right;  . CARDIAC CATHETERIZATION  07/2020  . CATARACT EXTRACTION W/ INTRAOCULAR LENS  IMPLANT, BILATERAL Bilateral   . CORONARY STENT INTERVENTION N/A 07/27/2020   Procedure: CORONARY STENT INTERVENTION;  Surgeon: Burnell Blanks, MD;  Location: [place] CV LAB;  Service: Cardiovascular;  Laterality: N/A;  . CYSTOSCOPY W/ INTERNAL URETHROTOMY  09/2014  . EYE SURGERY Bilateral 2006  . INSERTION OF DIALYSIS CATHETER N/A 11/08/2020   Procedure: INSERTION OF DIALYSIS CATHETER;  Surgeon: [name], MD;  Location: [place];  Service: Vascular;  Laterality: N/A;  . IR THROMBECTOMY AV FISTULA W/THROMBOLYSIS/PTA INC/SHUNT/IMG RIGHT Right 08/01/2019  . IR US GUIDE VASC ACCESS RIGHT  08/01/2019  . KIDNEY TRANSPLANT  2009  . LEFT HEART CATH AND CORONARY ANGIOGRAPHY N/A 07/27/2020   Procedure: LEFT HEART CATH AND CORONARY ANGIOGRAPHY;  Surgeon: Burnell Blanks, MD;  Location: [place] CV LAB;  Service: Cardiovascular;  Laterality: N/A;  . SIGMOIDOSCOPY    . UPPER EXTREMITY VENOGRAPHY Bilateral 06/02/2019   Procedure: UPPER EXTREMITY VENOGRAPHY;  Surgeon: Elam Dutch, MD;  Location: [place] CV LAB;  Service: Cardiovascular;  Laterality: Bilateral;  . UPPER GASTROINTESTINAL ENDOSCOPY    . VASCULAR ACCESS DEVICE INSERTION Right 11/07/2020   Procedure: RIGHT UPPER EXTREMITY HERO GRAFT;  Surgeon: [name], MD;  Location: [facility] OR;  Service:  Vascular;  Laterality: Right;   Family History  Problem Relation Age of Onset  . Hypertension Mother   . Diabetes Mother   . Diabetes Father   . Diabetes Brother   . Colon cancer Neg Hx   . Colon polyps Neg Hx   . Esophageal cancer Neg Hx   . Rectal cancer Neg Hx   . Stomach cancer Neg Hx    Social History:  reports that he has never smoked. He has never used smokeless tobacco. He reports that he does not drink alcohol and does not use drugs.  ROS: As per HPI otherwise negative.  Physical Exam: Vitals:   01/14/21 0930 01/14/21 1000 01/14/21 1027 01/14/21 1100  BP: (!) 78/64  (!) 94/18 (!) 112/20  Pulse: Marland Kitchen)  107  (!) 101 100  Resp: (!) _0 Temp:   (!) 97.5 F (36.4 C)   TempSrc:   Oral   SpO2: 99%  99% 100%  Weight:  (!) 137.9 kg    Height:  _1  (1.778 m)       General: Well developed, well nourished, in no acute distress. Nasal O2 Head: Normocephalic, atraumatic, sclera non-icteric, mucus membranes are moist. Neck: Supple without lymphadenopathy/masses. JVD not elevated. Lungs: Clear bilaterally to auscultation without wheezes, rales, or rhonchi. Breathing is unlabored. Heart: RRR with normal S1, S2. No murmurs, rubs, or gallops appreciated. Abdomen: Soft, non-tender, non-distended with normoactive bowel sounds.  Musculoskeletal:  Strength and tone appear normal for age. Lower extremities: No edema or ischemic changes, no open wounds. Scaly chronic skin lesions noted to B lower legs. R 4th/5th toes with dried wounds. Neuro: Alert and oriented X 3. Moves all extremities spontaneously. Psych:  Responds to questions appropriately with a normal affect. Dialysis Access: R IJ TDC in place  Allergies  Allergen Reactions  . Pork-Derived Products Anaphylaxis and Other (See Comments)    Fever also   . Ace Inhibitors Cough  . Hydrocodone Other (See Comments)    Hallucinations   Prior to Admission medications   Medication Sig Start Date End Date Taking? Authorizing  Provider  diphenhydrAMINE (BENADRYL) 25 MG tablet Take 1 tablet (25 mg total) by mouth every 8 (eight) hours as needed for itching. 01/14/21  Yes Ripley Fraise, MD  predniSONE (DELTASONE) 50 MG tablet 1 tablet PO QD X4 days 01/14/21  Yes Ripley Fraise, MD  Accu-Chek FastClix Lancets MISC USE TO CHECK BLOOD SUGARS 4 TIMES A DAY. 08/21/20   Elayne Snare, MD  acetaminophen (TYLENOL) 325 MG tablet Take 2 tablets (650 mg total) by mouth every 4 (four) hours as needed for headache or mild pain. 08/04/20   Richardson Dopp T, PA-C  allopurinol (ZYLOPRIM) 100 MG tablet Take 1 tablet (100 mg total) by mouth 3 (three) times a week. After dialysis Patient taking differently: Take 100 mg by mouth every Tuesday, Thursday, and Saturday at 6 PM. After dialysis 05/17/19   Hoyt Koch, MD  atorvastatin (LIPITOR) 80 MG tablet Take 1 tablet (80 mg total) by mouth daily. 08/04/20 08/04/21  Richardson Dopp T, PA-C  AURYXIA 1 GM 210 MG(Fe) tablet Take 630 mg by mouth 3 (three) times daily with meals. 11/08/19   [provider]  busPIRone (BUSPAR) 5 MG tablet TAKE 1 TABLET TWICE DAILY AS NEEDED 01/14/21   Hoyt Koch, MD  clonazePAM (KLONOPIN) 0.5 MG tablet Use 1-2 tablets prior to dialysis 3 times weekly. Patient taking differently: Take 1 mg by mouth at bedtime. 10/29/20   Hoyt Koch, MD  clopidogrel (PLAVIX) 75 MG tablet Take 1 tablet (75 mg total) by mouth daily with breakfast. 09/26/20   Burnell Blanks, MD  glucose blood (ACCU-CHEK GUIDE) test strip USE AS INSTRUCTED TO CHECK BLOOD SUGAR 3 TIMES DAILY AS NEEDED 08/21/20   Elayne Snare, MD  insulin glargine, 2 Unit Dial, (TOUJEO MAX SOLOSTAR) 300 UNIT/ML Solostar Pen INJECT 80 UNITS UNDER THE SKIN IN THE MORNING AND 80 UNITS IN THE EVENING 12/05/20   Elayne Snare, MD  Insulin Pen Needle (DROPLET PEN NEEDLES) 32G X 4 MM MISC USE&nbsp;&nbsp;TO INJECT FIVE TIMES DAILY AS DIRECTED 08/21/20   Elayne Snare, MD  Insulin Pen Needle 32G X 8 MM  MISC Use with insulin x5 times a day Patient taking differently:  1 each by Other route See admin instructions. Use with insulin x5 times a day 08/16/19   Elayne Snare, MD  insulin regular human CONCENTRATED (HUMULIN R U-500 KWIKPEN) 500 UNIT/ML kwikpen Inject 50-70 Units into the skin See admin instructions. Inject 50 units ad breakfast , 70 units at lunch, and 60 units at dinner 12/05/20   Elayne Snare, MD  Methoxy PEG-Epoetin Beta (MIRCERA IJ) Mircera 08/15/20 08/14/21  [provider]  midodrine (PROAMATINE) 10 MG tablet Take 10 mg by mouth 3 (three) times daily. 09/27/20   [provider]  omeprazole (PRILOSEC) 20 MG capsule TAKE 1 CAPSULE EVERY DAY 01/14/21   Hoyt Koch, MD  sevelamer carbonate (RENVELA) 800 MG tablet Take 2,400 mg by mouth in the morning, at noon, and at bedtime. 10/15/20   [provider]  VELTASSA 8.4 g packet Take 8.4 g by mouth daily.  12/30/16   [provider]  warfarin (COUMADIN) 5 MG tablet Take 1 tablet (5 mg total) by mouth See admin instructions. TAKE 1 TABLET EVERY DAY OR AS DIRECTED BY COUMADIN CLINIC. Patient taking differently: Take 5 mg by mouth at bedtime. OR AS DIRECTED BY COUMADIN CLINIC. 08/29/20   Hoyt Koch, MD   Current Facility-Administered Medications  Medication Dose Route Frequency Provider Last Rate Last Admin  . 0.9 %  sodium chloride infusion  250 mL Intravenous PRN Minor, Grace Bushy, NP      . 0.9 %  sodium chloride infusion  250 mL Intravenous Continuous Minor, Grace Bushy, NP   Paused at 01/14/21 1029  . albuterol (VENTOLIN HFA) 108 (90 Base) MCG/ACT inhaler 2 puff  2 puff Inhalation Q6H PRN Noemi Chapel, MD      . argatroban 1 mg/mL infusion  0.5 mcg/kg/min Intravenous Continuous Henri Medal, RPH      . Chlorhexidine Gluconate Cloth 2 % PADS 6 each  6 each Topical Daily Freda Jackson B, MD      . docusate sodium (COLACE) capsule 100 mg  100 mg Oral BID PRN Minor, Grace Bushy, NP      . insulin  aspart (novoLOG) injection 0-6 Units  0-6 Units Subcutaneous Q4H Minor, Grace Bushy, NP      . pantoprazole (PROTONIX) injection 40 mg  40 mg Intravenous QHS Minor, Grace Bushy, NP      . polyethylene glycol (MIRALAX / GLYCOLAX) packet 17 g  17 g Oral Daily PRN Minor, Grace Bushy, NP      . sodium chloride flush (NS) 0.9 % injection 3 mL  3 mL Intravenous Q12H Minor, Grace Bushy, NP      . sodium chloride flush (NS) 0.9 % injection 3 mL  3 mL Intravenous PRN Minor, Grace Bushy, NP       Labs: Basic Metabolic Panel: Recent Labs  Lab 01/13/21 2311 01/14/21 0856 01/14/21 1052  NA 138 135 130*  K 5.8* 6.9* 7.1*  CL 101 99  --   CO2 23 21*  --   GLUCOSE 71 387*  --   BUN 56* 61*  --   CREATININE 9.15* 9.49*  --   CALCIUM 9.6 9.4  --    Liver Function Tests: Recent Labs  Lab 01/13/21 2311 01/14/21 0856  AST 26 25  ALT 13 18  ALKPHOS 60 63  BILITOT 1.3* 1.2  PROT 7.1 6.9  ALBUMIN 3.1* 2.9*   CBC: Recent Labs  Lab 01/13/21 2311 01/14/21 0856 01/14/21 1052  WBC 6.0 5.4  --   NEUTROABS 4.0  4.7  --   HGB 11.6* 10.8* 11.9*  HCT 39.7 37.8* 35.0*  MCV 98.5 101.6*  --   PLT 281 250  --    CBG: Recent Labs  Lab 01/13/21 2200 01/13/21 2238 01/14/21 0655 01/14/21 0822  GLUCAP 45* 70 266* 301*   Studies/Results: DG Chest Port 1 View  Result Date: 01/14/2021 CLINICAL DATA:  Shortness of breath EXAM: PORTABLE CHEST 1 VIEW COMPARISON:  November 08, 2020 FINDINGS: Central catheter tip is in the superior vena cava near the cavoatrial junction. There is mild left base atelectasis. No edema or airspace opacity. There is cardiomegaly with pulmonary vascularity within normal limits. There is aortic atherosclerosis. No adenopathy no bone lesions. IMPRESSION: Central catheter as described without pneumothorax. Cardiomegaly noted. Mild left base atelectasis. Lungs otherwise clear. Aortic Atherosclerosis (ICD10-I70.0). Electronically Signed   By: Lowella Grip III M.D.   On: 01/14/2021 09:16    Dialysis Orders:  TTS at [facility] 4:30hr, 250dialyzer, 400/A1.5, 130.5kg, 2K/2.5Ca, TDC, no heparin - Mircera 5mg IV q 2 weeks (last given 3/10) - Calcitriol 1.558m PO q HD  Assessment/Plan: 1.  Cardiac Arrest: While getting dressed this AM in the ED - less than 66m67mCPR without Epi or shocks before ROSC. HyperK v. ACS as cause? Echo/trops pending. 2.  Hyperkalemia: K 5.8 last night, now 6.9 after cardiac arrest this AM. S/p medical treatment + Lokelma. Will dialyze as soon as possible. 3.  ?Anaphylactic reaction: Presenting symptom to ED overnight, s/p steroids + benadryl. Will need to see allergist as outpatient. 4.  ESRD: Usual TTS schedule - ?may need to start CRRT given spectrum of recent events and hypotension - will d/w renal attending. 5.  Hypotension/volume: chronic hypotension on midodrine. 7kg up per bed weights, UF as tolerated with HD today. 6.  Anemia: Hgb 11.9 - no ESA needed for now. 7.  Metabolic bone disease: CorrCa slightly high - hold off on calcitriol for now. 8.  T2DM 9.  R foot wound: Appears to be healing, no drainage.  KatVeneta PentonA-C 01/14/2021, 12:25 PM  [facility]

## 2021-01-14 NOTE — Consult Note (Addendum)
Cardiology Consultation:   Patient ID: [name] [name] MRN: [id number]; DOB: 11/16/1965  Admit date: 01/13/2021 Date of Consult: 01/14/2021  PCP:  Hoyt Koch, MD   [place]  Cardiologist:  Lauree Chandler, MD  Advanced Practice Provider:  No care team member to display Electrophysiologist:  None     Patient Profile:   [name] [name] is a 55 y.o. male with a hx of CAD who is being seen today for the evaluation of resp arrest at the request of Dr. Erin Fulling.  History of Present Illness:   Mr. Ducre was seen in the emergency room for a presumed allergic reaction.  He reports that he was having facial numbness.  This is a symptom that he has when he has high potassium.  His potassium was high but it was thought he could go to outpatient dialysis.  While he was waiting for transportation to take him from the ER to outpatient dialysis, he had a respiratory and perhaps cardiac arrest.  He did not require any defibrillation or medications per my conversation with the ER nurse.  He did receive some chest compressions.  I first came to see him in the emergency room shortly after his resuscitation.  He only reported some right-sided chest pain.  He never required intubation.  ECG at that time was essentially unchanged with a widened QRS without any significant change from prior ST patterns.  We elected not to take him emergently to the cardiac Cath Lab given that his potassium was found to be greater than 7 even after he received insulin and calcium gluconate.  I came to see him again a few hours later while he was in the ICU.  He was more alert.  We discussed his prior history of his coronary stent back in late 2021.  At that time, he felt some discomfort in his chest which was increasing in frequency.  It was a pressure.  He was treated for GERD, but his symptoms did not improve.  A few days after being seen in the office, he ended up calling EMS due to persistent  symptoms in his chest.  He underwent cardiac cath with LAD stent placement.  He states that his symptoms today were completely different from what he had in 2021.  What he felt today was more consistent with high potassium.  He also reports some itching on his sides.     Past Medical History:  Diagnosis Date  . Allergic rhinitis   . Allergy   . Anemia   . Aortic insufficiency    SBE in 2016 >> pt declined AVR // a. Echo 7/16:  Mod LVH, EF 50-55%, mild to mod AI, severe LAE, PASP 57 mmHg (LV ID end diastolic 00.9 mm);  b. Echo 2/17:  Mod LVH, EF 50-55%, mod AI, MAC, mild MR, mod LAE, mild to mod TR, PASP 63 mmHg // Echo 2/21: normal EF, mild MR, mild AI  . Atrial fibrillation and flutter ([place])    chronic anticoag with Warfarin // bradycardia noted during admit 10/21  . Bilateral carpal tunnel syndrome   . Bradycardia    brady/low BP event during admit in 10/21 >> poor candidate for pacer/EPS due to high risk of infection with ESRD; pacer not recommended by EP  . CAD (coronary artery disease)    s/p NSTEMI 10/21: cath>> pLAD 99 >> tx with DES; Lat RI 100 and OM1 100 - med Rx  . Cataract    removed both eyes   .  Cholecystitis    dx during admx 10/21 >> HIDA neg // exam benign // high risk for surgery >> no surgical intervention recommended.   . Claustrophobia   . COPD (chronic obstructive pulmonary disease) ([place])   . Coronary artery disease   . Diabetes mellitus    Type II  . Diabetic peripheral neuropathy ([place])   . Diabetic retinopathy ([place])   . Discitis of lumbar region    resolved  . DVT (deep venous thrombosis) ([place]) 10/07/2015   LLE  . Dysrhythmia 07/2020   afib  . Endocarditis 11/28/14  . ESRD (end stage renal disease) on dialysis [facility])    s/p renal transplant in 2009, failed in 2016  . Gait disorder   . Gastroparesis   . GERD (gastroesophageal reflux disease)   . Gout   . Hearing difficulty   . Hemodialysis patient ([place])    tuesday, thursday, saturday   . HFrEF (HF  with reduced EF)    Ischemic CM // Echo 10/21: EF 40-45, dist sept and apical AK, mild LVH, normal RVSF, mod MAC  . Hyperlipidemia   . Hypertension   . Incomplete bladder emptying   . Leg pain 02/05/11   with walking  . Morbid obesity ([place])   . Myocardial infarction ([place]) 07/2020  . Obstructive sleep apnea    not using CPAP- lost 100lbs  . Osteomyelitis ([place])   . Posttraumatic stress disorder   . Rotator cuff disorder   . Secondary hyperparathyroidism ([place])   . Sepsis ([place])    Postive blood cultures -   . Sleep apnea    no cpap  . Urethral stricture   . Vitamin D deficiency     Past Surgical History:  Procedure Laterality Date  . ARTERIOVENOUS GRAFT PLACEMENT Bilateral "several"  . AV FISTULA PLACEMENT Bilateral 2015  . AV FISTULA PLACEMENT Right 06/13/2019   Procedure: INSERTION OF ARTERIOVENOUS (AV) GORE-TEX GRAFT RIGHT  ARM;  Surgeon: Elam Dutch, MD;  Location: [place];  Service: Vascular;  Laterality: Right;  . [place] REMOVAL Right 11/08/2020   Procedure: Excision of Foreign Body;  Surgeon: [name], MD;  Location: [place];  Service: Vascular;  Laterality: Right;  . BASCILIC VEIN TRANSPOSITION Right 09/27/2015  . [place] Right 09/27/2015   Procedure: BASILIC VEIN TRANSPOSITION right;  Surgeon: Mal Misty, MD;  Location: [facility];  Service: Vascular;  Laterality: Right;  . CARDIAC CATHETERIZATION  07/2020  . CATARACT EXTRACTION W/ INTRAOCULAR LENS  IMPLANT, BILATERAL Bilateral   . CORONARY STENT INTERVENTION N/A 07/27/2020   Procedure: CORONARY STENT INTERVENTION;  Surgeon: Burnell Blanks, MD;  Location: [place] CV LAB;  Service: Cardiovascular;  Laterality: N/A;  . CYSTOSCOPY W/ INTERNAL URETHROTOMY  09/2014  . EYE SURGERY Bilateral 2006  . INSERTION OF DIALYSIS CATHETER N/A 11/08/2020   Procedure: INSERTION OF DIALYSIS CATHETER;  Surgeon: [name], MD;  Location: [place];  Service: Vascular;  Laterality: N/A;  . IR  THROMBECTOMY AV FISTULA W/THROMBOLYSIS/PTA INC/SHUNT/IMG RIGHT Right 08/01/2019  . IR US GUIDE VASC ACCESS RIGHT  08/01/2019  . KIDNEY TRANSPLANT  2009  . LEFT HEART CATH AND CORONARY ANGIOGRAPHY N/A 07/27/2020   Procedure: LEFT HEART CATH AND CORONARY ANGIOGRAPHY;  Surgeon: Burnell Blanks, MD;  Location: [place] CV [facility];  Service: Cardiovascular;  Laterality: N/A;  . SIGMOIDOSCOPY    . UPPER EXTREMITY VENOGRAPHY Bilateral 06/02/2019   Procedure: UPPER EXTREMITY VENOGRAPHY;  Surgeon: Elam Dutch, MD;  Location: [place]  CV LAB;  Service: Cardiovascular;  Laterality: Bilateral;  . UPPER GASTROINTESTINAL ENDOSCOPY    . VASCULAR ACCESS DEVICE INSERTION Right 11/07/2020   Procedure: RIGHT UPPER EXTREMITY HERO GRAFT;  Surgeon: [name], MD;  Location: [facility];  Service: Vascular;  Laterality: Right;      Inpatient Medications: Scheduled Meds: . Chlorhexidine Gluconate Cloth  6 each Topical Daily  . insulin aspart  0-6 Units Subcutaneous Q4H  . methylPREDNISolone (SOLU-MEDROL) injection  80 mg Intravenous Once  . pantoprazole (PROTONIX) IV  40 mg Intravenous QHS  . sodium chloride flush  3 mL Intravenous Q12H   Continuous Infusions: . sodium chloride    . sodium chloride Stopped (01/14/21 1029)  . argatroban    . prismasol BGK 0/2.5    . prismasol BGK 0/2.5    . prismasol BGK 0/2.5     PRN Meds: sodium chloride, albuterol, alteplase, docusate sodium, heparin, polyethylene glycol, sodium chloride, sodium chloride flush  Allergies:    Allergies  Allergen Reactions  . Pork-Derived Products Anaphylaxis and Other (See Comments)    Fever also   . Ace Inhibitors Cough  . Hydrocodone Other (See Comments)    Hallucinations    Social History:   Social History   Socioeconomic History  . Marital status: Divorced    Spouse name: Not on file  . Number of children: 0  . Years of education: College   . Highest education level: Bachelor's degree (e.g., BA, AB, BS)   Occupational History    Employer: UNEMPLOYED  Tobacco Use  . Smoking status: Never Smoker  . Smokeless tobacco: Never Used  Vaping Use  . Vaping Use: Never used  Substance and Sexual Activity  . Alcohol use: No    Alcohol/week: 0.0 standard drinks  . Drug use: No  . Sexual activity: Not Currently  Other Topics Concern  . Not on file  Social History Narrative   Patient is Divorced.   Patient does not have any children.   Patient is right-handed   Patient is currently in college working on his Ph.D.      Social Determinants of Health   Financial Resource Strain: Not on file  Food Insecurity: Not on file  Transportation Needs: Not on file  Physical Activity: Not on file  Stress: Not on file  Social Connections: Not on file  Intimate Partner Violence: Not on file    Family History:    Family History  Problem Relation Age of Onset  . Hypertension Mother   . Diabetes Mother   . Diabetes Father   . Diabetes Brother   . Colon cancer Neg Hx   . Colon polyps Neg Hx   . Esophageal cancer Neg Hx   . Rectal cancer Neg Hx   . Stomach cancer Neg Hx      ROS:  Please see the history of present illness.  Itching All other ROS reviewed and negative.     Physical Exam/Data:   Vitals:   01/14/21 0930 01/14/21 1000 01/14/21 1027 01/14/21 1100  BP: (!) 78/64  (!) 94/18 (!) 112/20  Pulse: (!) 107  (!) 101 100  Resp: (!) _0 Temp:   (!) 97.5 F (36.4 C)   TempSrc:   Oral   SpO2: 99%  99% 100%  Weight:  (!) 137.9 kg    Height:  _1  (1.778 m)     No intake or output data in the 24 hours ending 01/14/21 1425 Last 3 Weights  01/14/2021 12/20/2020 11/09/2020  Weight (lbs) 304 lb 0.2 oz 297 lb 297 lb 2.9 oz  Weight (kg) 137.9 kg 134.718 kg 134.8 kg  Some encounter information is confidential and restricted. Go to Review Flowsheets activity to see all data.     Body mass index is 43.62 kg/m.  General:  Well nourished, well developed, in no acute distress HEENT:  normal Lymph: no adenopathy Neck: no JVD Endocrine:  No thryomegaly Vascular: No carotid bruits; FA pulses 2+ bilaterally without bruits  Cardiac:  normal S1, S2; RRR; no murmur  Lungs:  clear to auscultation bilaterally, no wheezing, rhonchi or rales  Abd: soft, nontender, no hepatomegaly  Ext: no edema Musculoskeletal:  No deformities, BUE and BLE strength normal and equal Skin: warm and dry , mild redness on both flanks where he has been scratching Neuro:  CNs 2-12 intact, no focal abnormalities noted Psych:  Normal affect   EKG:  The EKG was personally reviewed and demonstrates: Sinus tachycardia, right bundle branch block, poor R wave progression, nonspecific ST-T wave changes Telemetry:  Telemetry was personally reviewed and demonstrates: Normal sinus rhythm  Relevant CV Studies: Prior catheter results reviewed  Laboratory Data:  High Sensitivity Troponin:   Recent Labs  Lab 01/14/21 0944 01/14/21 1158  TROPONINIHS 683* 672*     Chemistry Recent Labs  Lab 01/13/21 2311 01/14/21 0856 01/14/21 1052  NA 138 135 130*  K 5.8* 6.9* 7.1*  CL 101 99  --   CO2 23 21*  --   GLUCOSE 71 387*  --   BUN 56* 61*  --   CREATININE 9.15* 9.49*  --   CALCIUM 9.6 9.4  --   GFRNONAA 6* 6*  --   ANIONGAP 14 15  --     Recent Labs  Lab 01/13/21 2311 01/14/21 0856  PROT 7.1 6.9  ALBUMIN 3.1* 2.9*  AST 26 25  ALT 13 18  ALKPHOS 60 63  BILITOT 1.3* 1.2   Hematology Recent Labs  Lab 01/13/21 2311 01/14/21 0856 01/14/21 1052  WBC 6.0 5.4  --   RBC 4.03* 3.72*  --   HGB 11.6* 10.8* 11.9*  HCT 39.7 37.8* 35.0*  MCV 98.5 101.6*  --   MCH 28.8 29.0  --   MCHC 29.2* 28.6*  --   RDW 17.7* 17.7*  --   PLT 281 250  --    BNPNo results for input(s): BNP, PROBNP in the last 168 hours.  DDimer No results for input(s): DDIMER in the last 168 hours.   Radiology/Studies:  DG Chest Port 1 View  Result Date: 01/14/2021 CLINICAL DATA:  Shortness of breath EXAM: PORTABLE CHEST  1 VIEW COMPARISON:  November 08, 2020 FINDINGS: Central catheter tip is in the superior vena cava near the cavoatrial junction. There is mild left base atelectasis. No edema or airspace opacity. There is cardiomegaly with pulmonary vascularity within normal limits. There is aortic atherosclerosis. No adenopathy no bone lesions. IMPRESSION: Central catheter as described without pneumothorax. Cardiomegaly noted. Mild left base atelectasis. Lungs otherwise clear. Aortic Atherosclerosis (ICD10-I70.0). Electronically Signed   By: Lowella Grip III M.D.   On: 01/14/2021 09:16   VAS [place] LOWER EXTREMITY VENOUS (DVT)  Result Date: 01/14/2021  Lower Venous DVT Study Indications: Pain.  Limitations: Body habitus, poor ultrasound/tissue interface and patient movement. Comparison Study: No previous exams Performing Technologist: Rogelia Rohrer  Examination Guidelines: A complete evaluation includes B-mode imaging, spectral Doppler, color Doppler, and power Doppler as needed of all accessible  portions of each vessel. Bilateral testing is considered an integral part of a complete examination. Limited examinations for reoccurring indications may be performed as noted. The reflux portion of the exam is performed with the patient in reverse Trendelenburg.  +---------+---------------+---------+-----------+----------+--------------+ RIGHT    CompressibilityPhasicitySpontaneityPropertiesThrombus Aging +---------+---------------+---------+-----------+----------+--------------+ CFV      Full                                                        +---------+---------------+---------+-----------+----------+--------------+ SFJ      Full                                                        +---------+---------------+---------+-----------+----------+--------------+ FV Prox  Full                                                        +---------+---------------+---------+-----------+----------+--------------+ FV  Mid   Full                                                        +---------+---------------+---------+-----------+----------+--------------+ FV DistalFull                                                        +---------+---------------+---------+-----------+----------+--------------+ PFV      Full                                                        +---------+---------------+---------+-----------+----------+--------------+ POP      Full                                                        +---------+---------------+---------+-----------+----------+--------------+   +---------+---------------+---------+-----------+----------+-------------------+ LEFT     CompressibilityPhasicitySpontaneityPropertiesThrombus Aging      +---------+---------------+---------+-----------+----------+-------------------+ CFV      Full           Yes      Yes                                      +---------+---------------+---------+-----------+----------+-------------------+ SFJ      Full                                                             +---------+---------------+---------+-----------+----------+-------------------+  FV Prox  Full           Yes      Yes                                      +---------+---------------+---------+-----------+----------+-------------------+ FV Mid   Full           Yes      Yes                                      +---------+---------------+---------+-----------+----------+-------------------+ FV DistalFull           Yes      Yes                                      +---------+---------------+---------+-----------+----------+-------------------+ PFV      Full                                                             +---------+---------------+---------+-----------+----------+-------------------+ POP      Full           Yes      Yes                                       +---------+---------------+---------+-----------+----------+-------------------+ PTV      Full                                         Not well visualized +---------+---------------+---------+-----------+----------+-------------------+ PERO     Full                                         Not well visualized +---------+---------------+---------+-----------+----------+-------------------+ intraosseous IV in LLE calf. Vessels difficult to visualize.    Summary: BILATERAL: - No evidence of deep vein thrombosis seen in the lower extremities, bilaterally. - No evidence of superficial venous thrombosis in the lower extremities, bilaterally. -No evidence of popliteal cyst, bilaterally.   *See table(s) above for measurements and observations.    Preliminary      Assessment and Plan:   1. Respiratory/cardiac arrest: Given his lack of ischemic symptoms even now, and the fact that his symptoms of facial numbness go along with his typical hyperkalemia, I think it is more likely that his episode today was due to elevated potassium.  He appears comfortable now in the unit.  Continue supportive care.  Will not pursue ischemic cardiac work-up at this time.  He has been taking his clopidogrel daily without missing any doses. 2. Elevated troponin: He does have end-stage renal disease and did receive chest compressions.  Slight trend down on the 2 troponins checked. 3. ESRD: Dialysis being adjusted to help bring down his potassium. 4. Will follow.    Risk Assessment/Risk Scores:      CRITICAL  CARE Performed by: Larae Grooms   Total critical care time: 40 minutes  Critical care time was exclusive of separately billable procedures and treating other patients.  Critical care was necessary to treat or prevent imminent or life-threatening deterioration.  Critical care was time spent personally by me on the following activities: development of treatment plan with patient and/or surrogate as well  as nursing, discussions with consultants, evaluation of patient's response to treatment, examination of patient, obtaining history from patient or surrogate, ordering and performing treatments and interventions, ordering and review of laboratory studies, ordering and review of radiographic studies, pulse oximetry and re-evaluation of patient's condition.        For questions or updates, please contact [place] Please consult [email or web address] for contact info under    Signed, Larae Grooms, MD  01/14/2021 2:25 PM

## 2021-01-14 NOTE — Plan of Care (Signed)

## 2021-01-14 NOTE — Progress Notes (Addendum)
ANTICOAGULATION CONSULT NOTE - Initial Consult  Pharmacy Consult for Argatroban Indication: chest pain/ACS  Allergies  Allergen Reactions  . Pork-Derived Products Anaphylaxis and Other (See Comments)    Fever also   . Ace Inhibitors Cough  . Hydrocodone Other (See Comments)    Hallucinations  . Pork Allergy     Other reaction(s): Unknown    Patient Measurements:   Heparin Dosing Weight: 105.2 kg  Vital Signs: Temp: 98.3 F (36.8 C) (04/05 0805) Temp Source: Oral (04/05 0805) BP: 78/64 (04/05 0930) Pulse Rate: 107 (04/05 0930)  Labs: Recent Labs    01/13/21 2311 01/13/21 2326 01/14/21 0856  HGB 11.6*  --  10.8*  HCT 39.7  --  37.8*  PLT 281  --  250  LABPROT  --  19.8*  --   INR  --  1.8*  --   CREATININE 9.15*  --   --     CrCl cannot be calculated (Unknown ideal weight.).   Medical History: Past Medical History:  Diagnosis Date  . Allergic rhinitis   . Allergy   . Anemia   . Aortic insufficiency    SBE in 2016 >> pt declined AVR // a. Echo 7/16:  Mod LVH, EF 50-55%, mild to mod AI, severe LAE, PASP 57 mmHg (LV ID end diastolic 24.4 mm);  b. Echo 2/17:  Mod LVH, EF 50-55%, mod AI, MAC, mild MR, mod LAE, mild to mod TR, PASP 63 mmHg // Echo 2/21: normal EF, mild MR, mild AI  . Atrial fibrillation and flutter ([place])    chronic anticoag with Warfarin // bradycardia noted during admit 10/21  . Bilateral carpal tunnel syndrome   . Bradycardia    brady/low BP event during admit in 10/21 >> poor candidate for pacer/EPS due to high risk of infection with ESRD; pacer not recommended by EP  . CAD (coronary artery disease)    s/p NSTEMI 10/21: cath>> pLAD 99 >> tx with DES; Lat RI 100 and OM1 100 - med Rx  . Cataract    removed both eyes   . Cholecystitis    dx during admx 10/21 >> HIDA neg // exam benign // high risk for surgery >> no surgical intervention recommended.   . Claustrophobia   . COPD (chronic obstructive pulmonary disease) ([place])   . Coronary artery  disease   . Diabetes mellitus    Type II  . Diabetic peripheral neuropathy ([place])   . Diabetic retinopathy ([place])   . Discitis of lumbar region    resolved  . DVT (deep venous thrombosis) ([place]) 10/07/2015   LLE  . Dysrhythmia 07/2020   afib  . Endocarditis 11/28/14  . ESRD (end stage renal disease) on dialysis [facility])    s/p renal transplant in 2009, failed in 2016  . Gait disorder   . Gastroparesis   . GERD (gastroesophageal reflux disease)   . Gout   . Hearing difficulty   . Hemodialysis patient ([place])    tuesday, thursday, saturday   . HFrEF (HF with reduced EF)    Ischemic CM // Echo 10/21: EF 40-45, dist sept and apical AK, mild LVH, normal RVSF, mod MAC  . Hyperlipidemia   . Hypertension   . Incomplete bladder emptying   . Leg pain 02/05/11   with walking  . Morbid obesity ([place])   . Myocardial infarction ([place]) 07/2020  . Obstructive sleep apnea    not using CPAP- lost 100lbs  . Osteomyelitis ([place])   . Posttraumatic stress  disorder   . Rotator cuff disorder   . Secondary hyperparathyroidism ([place])   . Sepsis ([place])    Postive blood cultures -   . Sleep apnea    no cpap  . Urethral stricture   . Vitamin D deficiency       Assessment: 55 yo male s/p cardiac arrest in ED this morning. Pharmacy consulted for heparin for ACS/chest pain also possible suspicion of PE. Patient is on warfarin prior to admission for AFib - last dose at 1800 prior to ED presentation per patient report though unable to gather home regimen per patient. INR 1.8 last night with INR in process now. Discussed with Dr. Erin Fulling and will start anticoagulation now given INR <2 last night and stop INR if needed if found to be >2. Of note, if found to be elevated could be due to lack of perfusion in cardiac arrest this morning.  Patient with anaphylaxis allergy to pork. Will use argatroban instead of heparin.   Goal of Therapy:  APTT 50-90 Monitor platelets by anticoagulation protocol: Yes   Plan:   Start  argatroban 0.60mg/kg/min Follow up in process INR  Check aPTT in 4 hrs  Monitor INR, aPTT, CBC and s/s of bleeding daily    GCristela Felt PharmD Clinical Pharmacist  01/14/2021,10:00 AM

## 2021-01-14 NOTE — ED Notes (Addendum)
Pt is not on cardiac monitor at this time per previous nurse.Pt was discharge at 1am but didn't have a ride at this time. Per RN patient ride will be here around 7:30-8. Pt was given to this RN by previous RN and states patient is stilling on side of the bed and paperwork is in hand. Pt verbalize understanding and is waiting on ride.

## 2021-01-14 NOTE — Progress Notes (Signed)
[facility]  Itching Wants to eat Feels OK On exam: looks great, some hives over back  Advance diet Oral benadryl Benadryl cream  Roselie Awkward, MD [facility] Pager: [phone] Cell: [phone] If no response, please call [phone] until 7pm After 7:00 pm call [place]  [phone]

## 2021-01-14 NOTE — ED Provider Notes (Signed)
[facility] EMERGENCY DEPARTMENT Provider Note   CSN: [id number] Arrival date & time: 01/13/21  1835     History Chief Complaint  Patient presents with  . Allergic Reaction  . Pruritis    [name] [name] is a 55 y.o. male.  HPI   I was called to bedside for a code alarm.  Reportedly from nursing staff patient was sitting up and about to get in his wheelchair to be discharged when he became unresponsive and pulseless.  On my arrival to the room active CPR was in process, patient unresponsive, pulseless.  Report from nursing staff is that patient presented yesterday for rash, given Benadryl, potassium was noted to be slightly elevated and he was plan for discharge to dialysis today.  Past Medical History:  Diagnosis Date  . Allergic rhinitis   . Allergy   . Anemia   . Aortic insufficiency    SBE in 2016 >> pt declined AVR // a. Echo 7/16:  Mod LVH, EF 50-55%, mild to mod AI, severe LAE, PASP 57 mmHg (LV ID end diastolic 37.6 mm);  b. Echo 2/17:  Mod LVH, EF 50-55%, mod AI, MAC, mild MR, mod LAE, mild to mod TR, PASP 63 mmHg // Echo 2/21: normal EF, mild MR, mild AI  . Atrial fibrillation and flutter ([place])    chronic anticoag with Warfarin // bradycardia noted during admit 10/21  . Bilateral carpal tunnel syndrome   . Bradycardia    brady/low BP event during admit in 10/21 >> poor candidate for pacer/EPS due to high risk of infection with ESRD; pacer not recommended by EP  . CAD (coronary artery disease)    s/p NSTEMI 10/21: cath>> pLAD 99 >> tx with DES; Lat RI 100 and OM1 100 - med Rx  . Cataract    removed both eyes   . Cholecystitis    dx during admx 10/21 >> HIDA neg // exam benign // high risk for surgery >> no surgical intervention recommended.   . Claustrophobia   . COPD (chronic obstructive pulmonary disease) ([place])   . Coronary artery disease   . Diabetes mellitus    Type II  . Diabetic peripheral neuropathy ([place])   . Diabetic retinopathy ([place])   .  Discitis of lumbar region    resolved  . DVT (deep venous thrombosis) ([place]) 10/07/2015   LLE  . Dysrhythmia 07/2020   afib  . Endocarditis 11/28/14  . ESRD (end stage renal disease) on dialysis [facility])    s/p renal transplant in 2009, failed in 2016  . Gait disorder   . Gastroparesis   . GERD (gastroesophageal reflux disease)   . Gout   . Hearing difficulty   . Hemodialysis patient ([place])    tuesday, thursday, saturday   . HFrEF (HF with reduced EF)    Ischemic CM // Echo 10/21: EF 40-45, dist sept and apical AK, mild LVH, normal RVSF, mod MAC  . Hyperlipidemia   . Hypertension   . Incomplete bladder emptying   . Leg pain 02/05/11   with walking  . Morbid obesity ([place])   . Myocardial infarction ([place]) 07/2020  . Obstructive sleep apnea    not using CPAP- lost 100lbs  . Osteomyelitis ([place])   . Posttraumatic stress disorder   . Rotator cuff disorder   . Secondary hyperparathyroidism ([place])   . Sepsis ([place])    Postive blood cultures -   . Sleep apnea    no cpap  . Urethral stricture   .  Vitamin D deficiency     Patient Active Problem List   Diagnosis Date Noted  . Hemodialysis catheter dysfunction ([place]) 11/08/2020  . ESRD (end stage renal disease) ([place]) 11/08/2020  . Bradycardia 08/03/2020  . Hypotension 08/02/2020  . CAD (coronary artery disease) 08/02/2020  . Shock - Cardiogenic vs Septic   . NSTEMI (non-ST elevated myocardial infarction) ([place]) 07/27/2020  . Atrial Fibrillation/Flutter 07/27/2020  . Band keratopathy of both eyes 11/10/2019  . Pseudophakia of both eyes 11/10/2019  . Routine general medical examination at a health care facility 07/21/2019  . Complication of vascular dialysis catheter 07/17/2019  . Cholecystitis 04/20/2019  . Allergy, unspecified, sequela 12/29/2018  . Pain, unspecified 12/29/2018  . Leg lesion 06/16/2018  . Protein calorie malnutrition ([place]) 05/09/2018  . Hyperkalemia 04/11/2018  . Hemorrhoid 02/28/2018  . Numbness 10/20/2017  .  Fluid overload, unspecified 06/04/2016  . Hypocalcemia 02/20/2016  . Anterior urethral stricture 12/09/2015  . Iron deficiency anemia, unspecified 11/30/2015  . Dependence on renal dialysis ([place]) 11/27/2015  . ESRD on dialysis ([place]) 11/15/2015  . Coagulation defect, unspecified ([place]) 11/02/2015  . HFrEF (heart failure with reduced ejection fraction) ([place]) 10/07/2015  . Prolonged Q-T interval on ECG 10/07/2015  . Chronic deep vein thrombosis (DVT) of left lower extremity ([place]) 10/07/2015  . Hypomagnesemia 06/26/2015  . Aortic insufficiency 04/12/2015  . Endocarditis 12/04/2014  . Lumbar discitis 11/22/2014  . Back pain at L4-L5 level   . Anemia due to chronic kidney disease   . Cervical radiculopathy   . Diabetic peripheral neuropathy ([place]) 10/16/2014  . Hematuria 08/06/2014  . Renal transplant, status post 04/05/2014  . OSA (obstructive sleep apnea) 04/05/2014  . Severe obesity (BMI >= 40) ([place]) 04/05/2014  . Benign fibroma of prostate 02/05/2014  . Bladder neck obstruction 02/05/2014  . Visceral obesity 04/08/2012  . Male infertility 03/28/2012  . Immunosuppressive management encounter following kidney transplant 03/24/2012  . Diabetic macular edema ([place]) 01/15/2012  . Diabetes mellitus, type 2 ([place]) 01/15/2012  . IMMUNOCOMPROMISED 01/30/2010  . Diarrhea 01/30/2010  . HYPERPARATHYROIDISM, SECONDARY 01/27/2010  . GOUT 01/27/2010  . Anxiety 01/27/2010  . INSOMNIA 01/27/2010  . HLD (hyperlipidemia) 05/25/2008  . Essential hypertension 05/25/2008    Past Surgical History:  Procedure Laterality Date  . ARTERIOVENOUS GRAFT PLACEMENT Bilateral "several"  . AV FISTULA PLACEMENT Bilateral 2015  . AV FISTULA PLACEMENT Right 06/13/2019   Procedure: INSERTION OF ARTERIOVENOUS (AV) GORE-TEX GRAFT RIGHT  ARM;  Surgeon: Elam Dutch, MD;  Location: [facility];  Service: Vascular;  Laterality: Right;  . [place] REMOVAL Right 11/08/2020   Procedure: Excision of Foreign Body;  Surgeon: [name], MD;  Location: [facility];  Service: Vascular;  Laterality: Right;  . BASCILIC VEIN TRANSPOSITION Right 09/27/2015  . [place] Right 09/27/2015   Procedure: BASILIC VEIN TRANSPOSITION right;  Surgeon: Mal Misty, MD;  Location: [facility];  Service: Vascular;  Laterality: Right;  . CARDIAC CATHETERIZATION  07/2020  . CATARACT EXTRACTION W/ INTRAOCULAR LENS  IMPLANT, BILATERAL Bilateral   . CORONARY STENT INTERVENTION N/A 07/27/2020   Procedure: CORONARY STENT INTERVENTION;  Surgeon: Burnell Blanks, MD;  Location: [facility];  Service: Cardiovascular;  Laterality: N/A;  . CYSTOSCOPY W/ INTERNAL URETHROTOMY  09/2014  . EYE SURGERY Bilateral 2006  . INSERTION OF DIALYSIS CATHETER N/A 11/08/2020   Procedure: INSERTION OF DIALYSIS CATHETER;  Surgeon: [name], MD;  Location: [facility];  Service: Vascular;  Laterality: N/A;  . IR  THROMBECTOMY AV FISTULA W/THROMBOLYSIS/PTA INC/SHUNT/IMG RIGHT Right 08/01/2019  . IR US GUIDE VASC ACCESS RIGHT  08/01/2019  . KIDNEY TRANSPLANT  2009  . LEFT HEART CATH AND CORONARY ANGIOGRAPHY N/A 07/27/2020   Procedure: LEFT HEART CATH AND CORONARY ANGIOGRAPHY;  Surgeon: Burnell Blanks, MD;  Location: [facility];  Service: Cardiovascular;  Laterality: N/A;  . SIGMOIDOSCOPY    . UPPER EXTREMITY VENOGRAPHY Bilateral 06/02/2019   Procedure: UPPER EXTREMITY VENOGRAPHY;  Surgeon: Elam Dutch, MD;  Location: [facility];  Service: Cardiovascular;  Laterality: Bilateral;  . UPPER GASTROINTESTINAL ENDOSCOPY    . VASCULAR ACCESS DEVICE INSERTION Right 11/07/2020   Procedure: RIGHT UPPER EXTREMITY HERO GRAFT;  Surgeon: [name], MD;  [facility];  Service: Vascular;  Laterality: Right;       Family History  Problem Relation Age of Onset  . Hypertension Mother   . Diabetes Mother   . Diabetes Father   . Diabetes Brother   . Colon cancer Neg Hx   . Colon polyps Neg Hx   . Esophageal  cancer Neg Hx   . Rectal cancer Neg Hx   . Stomach cancer Neg Hx     Social History   Tobacco Use  . Smoking status: Never Smoker  . Smokeless tobacco: Never Used  Vaping Use  . Vaping Use: Never used  Substance Use Topics  . Alcohol use: No    Alcohol/week: 0.0 standard drinks  . Drug use: No    Home Medications Prior to Admission medications   Medication Sig Start Date End Date Taking? Authorizing Provider  diphenhydrAMINE (BENADRYL) 25 MG tablet Take 1 tablet (25 mg total) by mouth every 8 (eight) hours as needed for itching. 01/14/21  Yes Ripley Fraise, MD  predniSONE (DELTASONE) 50 MG tablet 1 tablet PO QD X4 days 01/14/21  Yes Ripley Fraise, MD  Accu-Chek FastClix Lancets MISC USE TO CHECK BLOOD SUGARS 4 TIMES A DAY. 08/21/20   Elayne Snare, MD  acetaminophen (TYLENOL) 325 MG tablet Take 2 tablets (650 mg total) by mouth every 4 (four) hours as needed for headache or mild pain. 08/04/20   Richardson Dopp T, PA-C  allopurinol (ZYLOPRIM) 100 MG tablet Take 1 tablet (100 mg total) by mouth 3 (three) times a week. After dialysis Patient taking differently: Take 100 mg by mouth every Tuesday, Thursday, and Saturday at 6 PM. After dialysis 05/17/19   Hoyt Koch, MD  atorvastatin (LIPITOR) 80 MG tablet Take 1 tablet (80 mg total) by mouth daily. 08/04/20 08/04/21  Richardson Dopp T, PA-C  AURYXIA 1 GM 210 MG(Fe) tablet Take 630 mg by mouth 3 (three) times daily with meals. 11/08/19   [provider]  busPIRone (BUSPAR) 5 MG tablet TAKE 1 TABLET (5 MG TOTAL) BY MOUTH 2 (TWO) TIMES DAILY AS NEEDED. Patient taking differently: Take 5 mg by mouth daily as needed (Anxiety). 10/23/20   Hoyt Koch, MD  clonazePAM (KLONOPIN) 0.5 MG tablet Use 1-2 tablets prior to dialysis 3 times weekly. Patient taking differently: Take 1 mg by mouth at bedtime. 10/29/20   Hoyt Koch, MD  clopidogrel (PLAVIX) 75 MG tablet Take 1 tablet (75 mg total) by mouth daily with  breakfast. 09/26/20   Burnell Blanks, MD  glucose blood (ACCU-CHEK GUIDE) test strip USE AS INSTRUCTED TO CHECK BLOOD SUGAR 3 TIMES DAILY AS NEEDED 08/21/20   Elayne Snare, MD  insulin glargine, 2 Unit Dial, (TOUJEO MAX SOLOSTAR) 300 UNIT/ML Solostar  Pen INJECT 80 UNITS UNDER THE SKIN IN THE MORNING AND 80 UNITS IN THE EVENING 12/05/20   Elayne Snare, MD  Insulin Pen Needle (DROPLET PEN NEEDLES) 32G X 4 MM MISC USE&nbsp;&nbsp;TO INJECT FIVE TIMES DAILY AS DIRECTED 08/21/20   Elayne Snare, MD  Insulin Pen Needle 32G X 8 MM MISC Use with insulin x5 times a day Patient taking differently: 1 each by Other route See admin instructions. Use with insulin x5 times a day 08/16/19   Elayne Snare, MD  insulin regular human CONCENTRATED (HUMULIN R U-500 KWIKPEN) 500 UNIT/ML kwikpen Inject 50-70 Units into the skin See admin instructions. Inject 50 units ad breakfast , 70 units at lunch, and 60 units at dinner 12/05/20   Elayne Snare, MD  Methoxy PEG-Epoetin Beta (MIRCERA IJ) Mircera 08/15/20 08/14/21  [provider]  midodrine (PROAMATINE) 10 MG tablet Take 10 mg by mouth 3 (three) times daily. 09/27/20   [provider]  omeprazole (PRILOSEC) 20 MG capsule TAKE 1 CAPSULE EVERY DAY Patient taking differently: Take 20 mg by mouth daily. 10/23/20   Hoyt Koch, MD  sevelamer carbonate (RENVELA) 800 MG tablet Take 2,400 mg by mouth in the morning, at noon, and at bedtime. 10/15/20   [provider]  VELTASSA 8.4 g packet Take 8.4 g by mouth daily.  12/30/16   [provider]  warfarin (COUMADIN) 5 MG tablet Take 1 tablet (5 mg total) by mouth See admin instructions. TAKE 1 TABLET EVERY DAY OR AS DIRECTED BY COUMADIN CLINIC. Patient taking differently: Take 5 mg by mouth at bedtime. OR AS DIRECTED BY COUMADIN CLINIC. 08/29/20   Hoyt Koch, MD    Allergies    Pork-derived products, Ace inhibitors, Hydrocodone, and Pork allergy  Review of Systems   Review of  Systems  Unable to perform ROS: Patient unresponsive    Physical Exam Updated Vital Signs BP (!) 108/43 (BP Location: Left Wrist)   Pulse (!) 110   Temp 98.3 F (36.8 C) (Oral)   Resp 16   SpO2 100%   Physical Exam Constitutional:      Comments: Unresponsive, active CPR, morbidly obese  Cardiovascular:     Comments: Pulseless, right-sided chest port Pulmonary:     Comments: No spontaneous respirations Abdominal:     Comments: Large obese abdomen  Musculoskeletal:     Comments: AV fistula left upper extremity     ED Results / Procedures / Treatments   Labs (all labs ordered are listed, but only abnormal results are displayed) Labs Reviewed  CBC WITH DIFFERENTIAL/PLATELET - Abnormal; Notable for the following components:      Result Value   RBC 4.03 (*)    Hemoglobin 11.6 (*)    MCHC 29.2 (*)    RDW 17.7 (*)    nRBC 0.3 (*)    All other components within normal limits  COMPREHENSIVE METABOLIC PANEL - Abnormal; Notable for the following components:   Potassium 5.8 (*)    BUN 56 (*)    Creatinine, Ser 9.15 (*)    Albumin 3.1 (*)    Total Bilirubin 1.3 (*)    GFR, Estimated 6 (*)    All other components within normal limits  PROTIME-INR - Abnormal; Notable for the following components:   Prothrombin Time 19.8 (*)    INR 1.8 (*)    All other components within normal limits  CBG MONITORING, ED - Abnormal; Notable for the following components:   Glucose-Capillary 45 (*)    All  other components within normal limits  CBG MONITORING, ED - Abnormal; Notable for the following components:   Glucose-Capillary 266 (*)    All other components within normal limits  CBG MONITORING, ED - Abnormal; Notable for the following components:   Glucose-Capillary 301 (*)    All other components within normal limits  SARS CORONAVIRUS 2 (TAT 6-24 HRS)  CBC WITH DIFFERENTIAL/PLATELET  COMPREHENSIVE METABOLIC PANEL  MAGNESIUM  CBG MONITORING, ED    EKG EKG  Interpretation  Date/Time:  Tuesday January 14 2021 08:37:58 EDT Ventricular Rate:  115 PR Interval:  147 QRS Duration: 182 QT Interval:  362 QTC Calculation: 501 R Axis:   -83 Text Interpretation: Sinus tachycardia Right bundle branch block Probable inferior infarct, acute Anterolateral infarct, age indeterminate >>> Acute MI <<< Wide QRS improved, RBBB Confirmed by Lavenia Atlas [phone]) on 01/14/2021 8:57:48 AM   Radiology No results found.  Procedures .Critical Care Performed by: Lorelle Gibbs, DO Authorized by: Lorelle Gibbs, DO   Critical care provider statement:    Critical care time (minutes):  45   Critical care was necessary to treat or prevent imminent or life-threatening deterioration of the following conditions:  Cardiac failure, metabolic crisis and renal failure   Critical care was time spent personally by me on the following activities:  Discussions with consultants, evaluation of patient's response to treatment, examination of patient, ordering and performing treatments and interventions, ordering and review of laboratory studies, ordering and review of radiographic studies, pulse oximetry, re-evaluation of patient's condition, obtaining history from patient or surrogate and review of old charts   I assumed direction of critical care for this patient from another provider in my specialty: no     Care discussed with: admitting provider       Medications Ordered in ED Medications  albuterol (VENTOLIN HFA) 108 (90 Base) MCG/ACT inhaler 2 puff (has no administration in time range)  diphenhydrAMINE (BENADRYL) injection 25 mg (25 mg Intravenous Given 01/13/21 2333)  dexamethasone (DECADRON) injection 10 mg (10 mg Intravenous Given 01/13/21 2332)  diphenhydrAMINE (BENADRYL) injection 25 mg (25 mg Intravenous Given 01/14/21 0053)  sodium zirconium cyclosilicate (LOKELMA) packet 10 g (10 g Oral Given 01/14/21 0052)  ondansetron (ZOFRAN) 4 MG/2ML injection (  Given 01/14/21 0824)   albuterol (VENTOLIN HFA) 108 (90 Base) MCG/ACT inhaler (  Given 01/14/21 0836)  calcium chloride injection (1 g Intravenous Given 01/14/21 0820)  sodium bicarbonate injection (25 mEq Intravenous Given 01/14/21 0820)  dextrose 50 % solution (1 ampule Intravenous Given 01/14/21 0825)    ED Course  I have reviewed the triage vital signs and the nursing notes.  Pertinent labs & imaging results that were available during my care of the patient were reviewed by me and considered in my medical decision making (see chart for details).    MDM Rules/Calculators/A&P                          Active CPR on my arrival to the room.  ACLS protocol was followed, patient remained pulseless at pulse checks with a wide-complex rhythm.  Potassium was reportedly 5.8 overnight.  Aggressive treatment of hyperkalemia given with calcium, bicarb, insulin.  Initially a left tibia IO was placed, ultimately to upper extremity peripheral IVs were successfully placed.  ROSC was achieved, patient became awake, conversational, protecting his airway, 100% oxygenation.  Patient moved to resuscitation room.  ICU consulted, they will admit.  Nephrology consult has been placed.  Patient continues to be awake with stable vitals at this time.  Final Clinical Impression(s) / ED Diagnoses Final diagnoses:  Allergic reaction, initial encounter  Hives  Hyperkalemia  Cardiac arrest [facility])    Rx / DC Orders ED Discharge Orders         Ordered    diphenhydrAMINE (BENADRYL) 25 MG tablet  Every 8 hours PRN        01/14/21 0153    predniSONE (DELTASONE) 50 MG tablet        01/14/21 0153           Karyssa Amaral, Alvin Critchley, DO 01/14/21 6825

## 2021-01-14 NOTE — Progress Notes (Signed)
Inpatient Diabetes Program Recommendations  AACE/ADA: New Consensus Statement on Inpatient Glycemic Control (2015)  Target Ranges:  Prepandial:   less than 140 mg/dL      Peak postprandial:   less than 180 mg/dL (1-2 hours)      Critically ill patients:  140 - 180 mg/dL   Lab Results  Component Value Date   GLUCAP 301 (H) 01/14/2021   HGBA1C 6.7 (H) 11/09/2020    Review of Glycemic Control Results for CASPIAN, DELEONARDIS (MRN [id number]) as of 01/14/2021 10:00  Ref. Range 01/13/2021 22:00 01/13/2021 22:38 01/14/2021 06:55 01/14/2021 08:22  Glucose-Capillary Latest Ref Range: 70 - 99 mg/dL 45 (L) 70 266 (H) 301 (H)   Diabetes history: DM 2 Outpatient Diabetes medications: Toujeo 80 units bid, Hmulin R U-500 50 units breakfast, 70 units lunch 60 units supper Current orders for Inpatient glycemic control:  None  Inpatient Diabetes Program Recommendations:    -  Novolog 0-9 units Q4 hours  May add basal insulin later today if trends continue to be elevated  Thanks,  Tama Headings RN, MSN, BC-ADM Inpatient Diabetes Coordinator Team Pager [phone] (8a-5p)

## 2021-01-14 NOTE — Progress Notes (Signed)
Called [facility] to discuss high BG, awaiting callback.

## 2021-01-14 NOTE — Progress Notes (Signed)
[facility] Progress Note Patient Name: [name] [name] DOB: 07-30-1966 MRN: [id number]   Date of Service  01/14/2021  HPI/Events of Note  Patient c/o chest pain d/t CPR. AST and ALT both normal.   eICU Interventions  Plan: 1. Tylenol 650 mg PO Q 6 hours PRN pain.      Intervention Category Major Interventions: Other:  Lysle Dingwall 01/14/2021, 10:59 PM

## 2021-01-15 DIAGNOSIS — I25118 Atherosclerotic heart disease of native coronary artery with other forms of angina pectoris: Secondary | ICD-10-CM | POA: Diagnosis not present

## 2021-01-15 DIAGNOSIS — E875 Hyperkalemia: Secondary | ICD-10-CM | POA: Diagnosis not present

## 2021-01-15 DIAGNOSIS — I469 Cardiac arrest, cause unspecified: Secondary | ICD-10-CM | POA: Diagnosis not present

## 2021-01-15 DIAGNOSIS — N186 End stage renal disease: Secondary | ICD-10-CM | POA: Diagnosis not present

## 2021-01-15 LAB — GLUCOSE, CAPILLARY
Glucose-Capillary: 126 mg/dL — ABNORMAL HIGH (ref 70–99)
Glucose-Capillary: 126 mg/dL — ABNORMAL HIGH (ref 70–99)
Glucose-Capillary: 135 mg/dL — ABNORMAL HIGH (ref 70–99)
Glucose-Capillary: 147 mg/dL — ABNORMAL HIGH (ref 70–99)
Glucose-Capillary: 149 mg/dL — ABNORMAL HIGH (ref 70–99)
Glucose-Capillary: 150 mg/dL — ABNORMAL HIGH (ref 70–99)
Glucose-Capillary: 152 mg/dL — ABNORMAL HIGH (ref 70–99)
Glucose-Capillary: 159 mg/dL — ABNORMAL HIGH (ref 70–99)
Glucose-Capillary: 160 mg/dL — ABNORMAL HIGH (ref 70–99)
Glucose-Capillary: 164 mg/dL — ABNORMAL HIGH (ref 70–99)
Glucose-Capillary: 175 mg/dL — ABNORMAL HIGH (ref 70–99)
Glucose-Capillary: 180 mg/dL — ABNORMAL HIGH (ref 70–99)
Glucose-Capillary: 185 mg/dL — ABNORMAL HIGH (ref 70–99)
Glucose-Capillary: 185 mg/dL — ABNORMAL HIGH (ref 70–99)
Glucose-Capillary: 190 mg/dL — ABNORMAL HIGH (ref 70–99)
Glucose-Capillary: 191 mg/dL — ABNORMAL HIGH (ref 70–99)
Glucose-Capillary: 194 mg/dL — ABNORMAL HIGH (ref 70–99)
Glucose-Capillary: 196 mg/dL — ABNORMAL HIGH (ref 70–99)
Glucose-Capillary: 200 mg/dL — ABNORMAL HIGH (ref 70–99)
Glucose-Capillary: 203 mg/dL — ABNORMAL HIGH (ref 70–99)
Glucose-Capillary: 206 mg/dL — ABNORMAL HIGH (ref 70–99)
Glucose-Capillary: 212 mg/dL — ABNORMAL HIGH (ref 70–99)

## 2021-01-15 LAB — CBC
HCT: 36.6 % — ABNORMAL LOW (ref 39.0–52.0)
Hemoglobin: 10.6 g/dL — ABNORMAL LOW (ref 13.0–17.0)
MCH: 28.6 pg (ref 26.0–34.0)
MCHC: 29 g/dL — ABNORMAL LOW (ref 30.0–36.0)
MCV: 98.7 fL (ref 80.0–100.0)
Platelets: 240 10*3/uL (ref 150–400)
RBC: 3.71 MIL/uL — ABNORMAL LOW (ref 4.22–5.81)
RDW: 17.5 % — ABNORMAL HIGH (ref 11.5–15.5)
WBC: 7.5 10*3/uL (ref 4.0–10.5)
nRBC: 0 % (ref 0.0–0.2)

## 2021-01-15 LAB — POCT I-STAT 7, (LYTES, BLD GAS, ICA,H+H)
Acid-base deficit: 2 mmol/L (ref 0.0–2.0)
Bicarbonate: 23.3 mmol/L (ref 20.0–28.0)
Calcium, Ion: 1.16 mmol/L (ref 1.15–1.40)
HCT: 35 % — ABNORMAL LOW (ref 39.0–52.0)
Hemoglobin: 11.9 g/dL — ABNORMAL LOW (ref 13.0–17.0)
O2 Saturation: 94 %
Patient temperature: 95
Potassium: 5.2 mmol/L — ABNORMAL HIGH (ref 3.5–5.1)
Sodium: 135 mmol/L (ref 135–145)
TCO2: 24 mmol/L (ref 22–32)
pCO2 arterial: 36 mmHg (ref 32.0–48.0)
pH, Arterial: 7.41 (ref 7.350–7.450)
pO2, Arterial: 65 mmHg — ABNORMAL LOW (ref 83.0–108.0)

## 2021-01-15 LAB — PROTIME-INR
INR: 6.9 (ref 0.8–1.2)
INR: 4.5 (ref 0.8–1.2)
Prothrombin Time: 57.8 seconds — ABNORMAL HIGH (ref 11.4–15.2)
Prothrombin Time: 41.4 seconds — ABNORMAL HIGH (ref 11.4–15.2)

## 2021-01-15 LAB — RENAL FUNCTION PANEL
Albumin: 3.1 g/dL — ABNORMAL LOW (ref 3.5–5.0)
Albumin: 2.9 g/dL — ABNORMAL LOW (ref 3.5–5.0)
Anion gap: 11 (ref 5–15)
Anion gap: 10 (ref 5–15)
BUN: 54 mg/dL — ABNORMAL HIGH (ref 6–20)
BUN: 45 mg/dL — ABNORMAL HIGH (ref 6–20)
CO2: 24 mmol/L (ref 22–32)
CO2: 23 mmol/L (ref 22–32)
Calcium: 8.9 mg/dL (ref 8.9–10.3)
Calcium: 8.2 mg/dL — ABNORMAL LOW (ref 8.9–10.3)
Chloride: 100 mmol/L (ref 98–111)
Chloride: 100 mmol/L (ref 98–111)
Creatinine, Ser: 7.36 mg/dL — ABNORMAL HIGH (ref 0.61–1.24)
Creatinine, Ser: 5.09 mg/dL — ABNORMAL HIGH (ref 0.61–1.24)
GFR, Estimated: 8 mL/min — ABNORMAL LOW (ref >60)
GFR, Estimated: 12 mL/min — ABNORMAL LOW (ref >60)
Glucose, Bld: 157 mg/dL — ABNORMAL HIGH (ref 70–99)
Glucose, Bld: 229 mg/dL — ABNORMAL HIGH (ref 70–99)
Phosphorus: 6.3 mg/dL — ABNORMAL HIGH (ref 2.5–4.6)
Phosphorus: 4.9 mg/dL — ABNORMAL HIGH (ref 2.5–4.6)
Potassium: 5.7 mmol/L — ABNORMAL HIGH (ref 3.5–5.1)
Potassium: 4.5 mmol/L (ref 3.5–5.1)
Sodium: 135 mmol/L (ref 135–145)
Sodium: 133 mmol/L — ABNORMAL LOW (ref 135–145)

## 2021-01-15 LAB — MAGNESIUM: Magnesium: 2.6 mg/dL — ABNORMAL HIGH (ref 1.7–2.4)

## 2021-01-15 LAB — APTT
aPTT: 73 seconds — ABNORMAL HIGH (ref 24–36)
aPTT: 81 seconds — ABNORMAL HIGH (ref 24–36)
aPTT: 85 seconds — ABNORMAL HIGH (ref 24–36)

## 2021-01-15 LAB — POTASSIUM: Potassium: 5.6 mmol/L — ABNORMAL HIGH (ref 3.5–5.1)

## 2021-01-15 MED ORDER — ORAL CARE MOUTH RINSE
15.0000 mL | Freq: Two times a day (BID) | OROMUCOSAL | Status: DC
  Administered 2021-01-16 – 2021-01-17 (×4): 15 mL via OROMUCOSAL

## 2021-01-15 MED ORDER — SEVELAMER CARBONATE 800 MG PO TABS
3200.0000 mg | ORAL_TABLET | Freq: Three times a day (TID) | ORAL | Status: DC
  Administered 2021-01-15 – 2021-01-18 (×8): 3200 mg via ORAL
  Filled 2021-01-15 (×8): qty 4

## 2021-01-15 MED ORDER — CLOPIDOGREL BISULFATE 75 MG PO TABS
75.0000 mg | ORAL_TABLET | Freq: Every day | ORAL | Status: DC
  Administered 2021-01-15 – 2021-01-18 (×4): 75 mg via ORAL
  Filled 2021-01-15 (×3): qty 1

## 2021-01-15 MED ORDER — ATORVASTATIN CALCIUM 80 MG PO TABS
80.0000 mg | ORAL_TABLET | Freq: Every day | ORAL | Status: DC
  Administered 2021-01-15 – 2021-01-18 (×4): 80 mg via ORAL
  Filled 2021-01-15 (×4): qty 1

## 2021-01-15 MED ORDER — MIDODRINE HCL 5 MG PO TABS
10.0000 mg | ORAL_TABLET | Freq: Three times a day (TID) | ORAL | Status: DC
  Administered 2021-01-15 – 2021-01-18 (×11): 10 mg via ORAL
  Filled 2021-01-15 (×9): qty 2

## 2021-01-15 NOTE — Progress Notes (Signed)
Progress Note  Patient Name: [name] [name] Date of Encounter: 01/15/2021  [facility] Cardiologist: Lauree Chandler, MD   Subjective   No further chest discomfort  Inpatient Medications    Scheduled Meds: . atorvastatin  80 mg Oral Daily  . Chlorhexidine Gluconate Cloth  6 each Topical Daily  . clonazePAM  0.5 mg Oral BID  . midodrine  10 mg Oral TID WC  . pantoprazole (PROTONIX) IV  40 mg Intravenous QHS  . sodium chloride flush  3 mL Intravenous Q12H   Continuous Infusions: . sodium chloride    . sodium chloride 10 mL/hr at 01/15/21 0900  . anticoagulant sodium citrate    . argatroban 0.6 mcg/kg/min (01/15/21 0900)  . insulin 2.8 Units/hr (01/15/21 0926)  . prismasol BGK 0/2.5 400 mL/hr at 01/15/21 0413  . prismasol BGK 0/2.5 200 mL/hr at 01/14/21 1530  . prismasol BGK 0/2.5 1,800 mL/hr at 01/15/21 0820   PRN Meds: sodium chloride, acetaminophen, albuterol, alteplase, anticoagulant sodium citrate, dextrose, diphenhydrAMINE, diphenhydrAMINE-zinc acetate, docusate sodium, polyethylene glycol, sodium chloride, sodium chloride flush   Vital Signs    Vitals:   01/15/21 0817 01/15/21 0821 01/15/21 0828 01/15/21 0900  BP: (!) 93/46 (!) 101/54  102/86  Pulse: 88 94  92  Resp: 17 19  15   Temp:   (!) 93.3 F (34.1 C)   TempSrc:   Axillary   SpO2: 95% 100%  94%  Weight:      Height:        Intake/Output Summary (Last 24 hours) at 01/15/2021 0941 Last data filed at 01/15/2021 0900 Gross per 24 hour  Intake 1501.41 ml  Output 1888 ml  Net -386.59 ml   Last 3 Weights 01/15/2021 01/14/2021 12/20/2020  Weight (lbs) 304 lb 0.2 oz 304 lb 0.2 oz 297 lb  Weight (kg) 137.9 kg 137.9 kg 134.718 kg  Some encounter information is confidential and restricted. Go to Review Flowsheets activity to see all data.      Telemetry    Normal sinus rhythm- Personally Reviewed  ECG    None today- Personally Reviewed  Physical Exam   GEN: No acute distress.   Neck: No  JVD Cardiac: RRR, no murmurs, rubs, or gallops.  Respiratory: Clear to auscultation bilaterally. GI: Soft, nontender, non-distended, obese MS:  Minimal LE edema; No deformity. Neuro:  Nonfocal  Psych: Normal affect   Labs    High Sensitivity Troponin:   Recent Labs  Lab 01/14/21 0944 01/14/21 1158  TROPONINIHS 683* 672*      Chemistry Recent Labs  Lab 01/13/21 2311 01/14/21 0856 01/14/21 1052 01/14/21 1709 01/14/21 2328 01/15/21 0128 01/15/21 0528  NA 138 135   < > 133*  --  135 135  K 5.8* 6.9*   < > 6.8* 5.6* 5.7* 5.2*  CL 101 99  --  98  --  100  --   CO2 23 21*  --  24  --  24  --   GLUCOSE 71 387*  --  260*  --  157*  --   BUN 56* 61*  --  63*  --  54*  --   CREATININE 9.15* 9.49*  --  9.25*  --  7.36*  --   CALCIUM 9.6 9.4  --  9.2  --  8.9  --   PROT 7.1 6.9  --   --   --   --   --   ALBUMIN 3.1* 2.9*  --  3.2*  --  3.1*  --  AST 26 25  --   --   --   --   --   ALT 13 18  --   --   --   --   --   ALKPHOS 60 63  --   --   --   --   --   BILITOT 1.3* 1.2  --   --   --   --   --   GFRNONAA 6* 6*  --  6*  --  8*  --   ANIONGAP 14 15  --  11  --  11  --    < > = values in this interval not displayed.     Hematology Recent Labs  Lab 01/13/21 2311 01/14/21 0856 01/14/21 1052 01/15/21 0128 01/15/21 0528  WBC 6.0 5.4  --  7.5  --   RBC 4.03* 3.72*  --  3.71*  --   HGB 11.6* 10.8* 11.9* 10.6* 11.9*  HCT 39.7 37.8* 35.0* 36.6* 35.0*  MCV 98.5 101.6*  --  98.7  --   MCH 28.8 29.0  --  28.6  --   MCHC 29.2* 28.6*  --  29.0*  --   RDW 17.7* 17.7*  --  17.5*  --   PLT 281 250  --  240  --     BNPNo results for input(s): BNP, PROBNP in the last 168 hours.   DDimer No results for input(s): DDIMER in the last 168 hours.   Radiology    DG Chest Port 1 View  Result Date: 01/14/2021 CLINICAL DATA:  Shortness of breath EXAM: PORTABLE CHEST 1 VIEW COMPARISON:  November 08, 2020 FINDINGS: Central catheter tip is in the superior vena cava near the cavoatrial  junction. There is mild left base atelectasis. No edema or airspace opacity. There is cardiomegaly with pulmonary vascularity within normal limits. There is aortic atherosclerosis. No adenopathy no bone lesions. IMPRESSION: Central catheter as described without pneumothorax. Cardiomegaly noted. Mild left base atelectasis. Lungs otherwise clear. Aortic Atherosclerosis (ICD10-I70.0). Electronically Signed   By: Lowella Grip III M.D.   On: 01/14/2021 09:16   VAS [place] LOWER EXTREMITY VENOUS (DVT)  Result Date: 01/15/2021  Lower Venous DVT Study Indications: Pain.  Limitations: Body habitus, poor ultrasound/tissue interface and patient movement. Comparison Study: No previous exams Performing Technologist: Rogelia Rohrer  Examination Guidelines: A complete evaluation includes B-mode imaging, spectral Doppler, color Doppler, and power Doppler as needed of all accessible portions of each vessel. Bilateral testing is considered an integral part of a complete examination. Limited examinations for reoccurring indications may be performed as noted. The reflux portion of the exam is performed with the patient in reverse Trendelenburg.  +---------+---------------+---------+-----------+----------+--------------+ RIGHT    CompressibilityPhasicitySpontaneityPropertiesThrombus Aging +---------+---------------+---------+-----------+----------+--------------+ CFV      Full                                                        +---------+---------------+---------+-----------+----------+--------------+ SFJ      Full                                                        +---------+---------------+---------+-----------+----------+--------------+ FV Prox  Full                                                        +---------+---------------+---------+-----------+----------+--------------+  FV Mid   Full                                                         +---------+---------------+---------+-----------+----------+--------------+ FV DistalFull                                                        +---------+---------------+---------+-----------+----------+--------------+ PFV      Full                                                        +---------+---------------+---------+-----------+----------+--------------+ POP      Full                                                        +---------+---------------+---------+-----------+----------+--------------+   +---------+---------------+---------+-----------+----------+-------------------+ LEFT     CompressibilityPhasicitySpontaneityPropertiesThrombus Aging      +---------+---------------+---------+-----------+----------+-------------------+ CFV      Full           Yes      Yes                                      +---------+---------------+---------+-----------+----------+-------------------+ SFJ      Full                                                             +---------+---------------+---------+-----------+----------+-------------------+ FV Prox  Full           Yes      Yes                                      +---------+---------------+---------+-----------+----------+-------------------+ FV Mid   Full           Yes      Yes                                      +---------+---------------+---------+-----------+----------+-------------------+ FV DistalFull           Yes      Yes                                      +---------+---------------+---------+-----------+----------+-------------------+ PFV      Full                                                             +---------+---------------+---------+-----------+----------+-------------------+  POP      Full           Yes      Yes                                      +---------+---------------+---------+-----------+----------+-------------------+ PTV      Full                                          Not well visualized +---------+---------------+---------+-----------+----------+-------------------+ PERO     Full                                         Not well visualized +---------+---------------+---------+-----------+----------+-------------------+ intraosseous IV in LLE calf. Vessels difficult to visualize.    Summary: BILATERAL: - No evidence of deep vein thrombosis seen in the lower extremities, bilaterally. - No evidence of superficial venous thrombosis in the lower extremities, bilaterally. -No evidence of popliteal cyst, bilaterally.   *See table(s) above for measurements and observations. Electronically signed by Curt Jews MD on 01/15/2021 at 6:02:19 AM.    Final     Cardiac Studies   EF 40 to 45% in October 2021  Patient Profile     55 y.o. male with cardiac arrest in the setting of hyperkalemia/ESRD  Assessment & Plan    Potassium improved.  Rhythm stable.  No angina.  Continue clopidogrel given his CAD and stent placement in 2021. [facility] will sign off.   Medication Recommendations: Continue clopidogrel Other recommendations (labs, testing, etc): Hyperkalemia per nephrology Follow up as an outpatient: Continue with plans for follow-up with Dr. Angelena Form in June  For questions or updates, please contact [facility] Please consult [email or web address] for contact info under        Signed, Larae Grooms, MD  01/15/2021, 9:41 AM

## 2021-01-15 NOTE — Progress Notes (Addendum)
[place] for Argatroban > Warfarin Indication: chest pain/ACS  Allergies  Allergen Reactions  . Pork-Derived Products Anaphylaxis and Other (See Comments)    Fever also  NOTE: IV HEPARIN IS PORCINE, SO DO NOT GIVE PT HEPARIN PRODUCTS  . Ace Inhibitors Cough  . Hydrocodone Other (See Comments)    Hallucinations    Patient Measurements: Height: _0  (177.8 cm) Weight: (!) 137.9 kg (304 lb 0.2 oz) IBW/kg (Calculated) : 73 Heparin Dosing Weight: 105.2 kg  Vital Signs: Temp: 93.3 F (34.1 C) (04/06 0828) Temp Source: Axillary (04/06 0828) BP: 101/74 (04/06 1000) Pulse Rate: 90 (04/06 1000)  Labs: Recent Labs    01/13/21 2311 01/13/21 2326 01/14/21 0856 01/14/21 0944 01/14/21 1018 01/14/21 1052 01/14/21 1158 01/14/21 1538 01/14/21 1709 01/14/21 2328 01/15/21 0128 01/15/21 0528 01/15/21 0712 01/15/21 0830  HGB 11.6*  --  10.8*  --   --  11.9*  --   --   --   --  10.6* 11.9*  --   --   HCT 39.7  --  37.8*  --   --  35.0*  --   --   --   --  36.6* 35.0*  --   --   PLT 281  --  250  --   --   --   --   --   --   --  240  --   --   --   APTT  --   --   --   --  31  --   --    < >  --  73* 81*  --  85*  --   LABPROT  --  19.8*  --   --  21.9*  --   --   --   --   --   --   --   --  57.8*  INR  --  1.8*  --   --  2.0*  --   --   --   --   --   --   --   --  6.9*  CREATININE 9.15*  --  9.49*  --   --   --   --   --  9.25*  --  7.36*  --   --   --   TROPONINIHS  --   --   --  683*  --   --  672*  --   --   --   --   --   --   --    < > = values in this interval not displayed.    Estimated Creatinine Clearance: 15.5 mL/min (A) (by C-G formula based on SCr of 7.36 mg/dL (H)).   Medical History: Past Medical History:  Diagnosis Date  . Allergic rhinitis   . Allergy   . Anemia   . Aortic insufficiency    SBE in 2016 >> pt declined AVR // a. Echo 7/16:  Mod LVH, EF 50-55%, mild to mod AI, severe LAE, PASP 57 mmHg (LV ID end  diastolic 85.4 mm);  b. Echo 2/17:  Mod LVH, EF 50-55%, mod AI, MAC, mild MR, mod LAE, mild to mod TR, PASP 63 mmHg // Echo 2/21: normal EF, mild MR, mild AI  . Atrial fibrillation and flutter ([place])    chronic anticoag with Warfarin // bradycardia noted during admit 10/21  . Bilateral carpal tunnel syndrome   . Bradycardia    brady/low  BP event during admit in 10/21 >> poor candidate for pacer/EPS due to high risk of infection with ESRD; pacer not recommended by EP  . CAD (coronary artery disease)    s/p NSTEMI 10/21: cath>> pLAD 99 >> tx with DES; Lat RI 100 and OM1 100 - med Rx  . Cataract    removed both eyes   . Cholecystitis    dx during admx 10/21 >> HIDA neg // exam benign // high risk for surgery >> no surgical intervention recommended.   . Claustrophobia   . COPD (chronic obstructive pulmonary disease) ([place])   . Coronary artery disease   . Diabetes mellitus    Type II  . Diabetic peripheral neuropathy ([place])   . Diabetic retinopathy ([place])   . Discitis of lumbar region    resolved  . DVT (deep venous thrombosis) ([place]) 10/07/2015   LLE  . Dysrhythmia 07/2020   afib  . Endocarditis 11/28/14  . ESRD (end stage renal disease) on dialysis [facility])    s/p renal transplant in 2009, failed in 2016  . Gait disorder   . Gastroparesis   . GERD (gastroesophageal reflux disease)   . Gout   . Hearing difficulty   . Hemodialysis patient ([place])    tuesday, thursday, saturday   . HFrEF (HF with reduced EF)    Ischemic CM // Echo 10/21: EF 40-45, dist sept and apical AK, mild LVH, normal RVSF, mod MAC  . Hyperlipidemia   . Hypertension   . Incomplete bladder emptying   . Leg pain 02/05/11   with walking  . Morbid obesity ([place])   . Myocardial infarction ([place]) 07/2020  . Obstructive sleep apnea    not using CPAP- lost 100lbs  . Osteomyelitis ([place])   . Posttraumatic stress disorder   . Rotator cuff disorder   . Secondary hyperparathyroidism ([place])   . Sepsis ([place])    Postive blood  cultures -   . Sleep apnea    no cpap  . Urethral stricture   . Vitamin D deficiency       Assessment: 55 yo male s/p cardiac arrest in ED this morning. Pharmacy consulted for heparin for ACS/chest pain also possible suspicion of PE. Patient is on warfarin prior to admission for Afib and hx DVT. Was started on argatroban given anaphylaxis allergy to pork-derived products. ACS ruled out, and CCM not concerned for PE. Pharmacy asked to restart warfarin.  APTT this AM therapeutic at 85 seconds. INR is falsely elevated this AM due to argatroban. Will stop argatroban infusion and recheck INR 4 hours later. CBC stable. No overt bleeding noted.   Goal of Therapy:  INR 2-3 Monitor platelets by anticoagulation protocol: Yes   Plan:   Stop argatroban Check PT/INR 4 hours after discontinuing argatroban Monitor daily INR, CBC, s/sx bleeding  ====================================== PM Update: INR remains elevated at 4.5 after stopping argatroban 4 hours prior. No overt bleeding noted. Will hold warfarin tonight and check INR with AM labs.  Richardine Service, PharmD, BCPS PGY2 Cardiology Pharmacy Resident Phone: [phone] 01/15/2021  10:51 AM  Please check [email or web address] for unit-specific pharmacy phone numbers.

## 2021-01-15 NOTE — Progress Notes (Addendum)
[facility] Progress Note  Subjective: seen in ICU, K+ down to 5.2.    Vitals:   01/15/21 0828 01/15/21 0900 01/15/21 0930 01/15/21 1000  BP:  102/86 95/62 101/74  Pulse:  92 72 90  Resp:  15 14 15   Temp: (!) 93.3 F (34.1 C)     TempSrc: Axillary     SpO2:  94% 92% 90%  Weight:      Height:        Exam:  Gen alert, no distress No rash, cyanosis or gangrene Sclera anicteric, throat clear  No jvd or bruits Chest clear bilat to bases, no rales/ wheezing RRR no MRG Abd soft ntnd no mass or ascites +bs obese GU normal MS no joint effusions or deformity Ext no LE or UE edema, no wounds or ulcers Neuro is alert, Ox 3 , nf RIJ TDC       OP HD: TTS NW   4.5h  F250  130.5kg  2/2.5 bath  TDC RIJ  Hep none  - Mircera 71mcg IV q 2 weeks (last given 3/10) - Calcitriol 1.88mcg PO q HD  Assessment/Plan: 1.  Cardiac Arrest: while leaving from the ED after Rx for possible anaphylaxis. Less than 88min CPR without Epi or shocks before ROSC.  2.  Hyperkalemia: K > 7 post-arrest, started on CRRT yest afternoon. K+ improving down to 5.7 this am. Cont CRRT w/ all zero K fluids.   3.  Anaphylactic reaction: itching and skin rash after eating shellfish, rx'd in ED with Decadron and benadryl. Will need to see allergist as outpatient. 4.  ESRD: Usual TTS schedule. Labs good, cont CRRT for now.  5.  Hypotension/volume: chronic hypotension on midodrine. 7kg up per bed weights. Start UF 100-150 cc/ hr.  6.  Anemia: Hgb 11.9 - no ESA needed for now. 7.  Metabolic bone disease: CorrCa slightly high - hold off on calcitriol for now. Resume home renvela 4 ac, hold auryxia until back home.  8.  T2DM 9. R foot wound: Appears to be healing   Rob Brydan Downard 01/15/2021, 10:34 AM   Recent Labs  Lab 01/14/21 1709 01/14/21 2328 01/15/21 0128 01/15/21 0528  K 6.8*   < > 5.7* 5.2*  BUN 63*  --  54*  --   CREATININE 9.25*  --  7.36*  --   CALCIUM 9.2  --  8.9  --   PHOS 5.6*  --  6.3*   --   HGB  --   --  10.6* 11.9*   < > = values in this interval not displayed.   Inpatient medications: . atorvastatin  80 mg Oral Daily  . Chlorhexidine Gluconate Cloth  6 each Topical Daily  . clonazePAM  0.5 mg Oral BID  . clopidogrel  75 mg Oral Q breakfast  . midodrine  10 mg Oral TID WC  . pantoprazole (PROTONIX) IV  40 mg Intravenous QHS  . sodium chloride flush  3 mL Intravenous Q12H   . sodium chloride    . sodium chloride 10 mL/hr at 01/15/21 1000  . anticoagulant sodium citrate    . argatroban 0.6 mcg/kg/min (01/15/21 1000)  . insulin 3.8 Units/hr (01/15/21 1032)  . prismasol BGK 0/2.5 400 mL/hr at 01/15/21 0413  . prismasol BGK 0/2.5 200 mL/hr at 01/14/21 1530  . prismasol BGK 0/2.5 1,800 mL/hr at 01/15/21 0820   sodium chloride, acetaminophen, albuterol, alteplase, anticoagulant sodium citrate, dextrose, diphenhydrAMINE, diphenhydrAMINE-zinc acetate, docusate sodium, polyethylene glycol, sodium chloride, sodium chloride  flush

## 2021-01-15 NOTE — Progress Notes (Signed)
[facility] for Argatroban Indication: chest pain/ACS  Labs: Recent Labs    01/13/21 2311 01/13/21 2326 01/14/21 0856 01/14/21 0944 01/14/21 1018 01/14/21 1052 01/14/21 1158 01/14/21 1538 01/14/21 1709 01/14/21 2328  HGB 11.6*  --  10.8*  --   --  11.9*  --   --   --   --   HCT 39.7  --  37.8*  --   --  35.0*  --   --   --   --   PLT 281  --  250  --   --   --   --   --   --   --   APTT  --   --   --   --  31  --   --  44*  --  73*  LABPROT  --  19.8*  --   --  21.9*  --   --   --   --   --   INR  --  1.8*  --   --  2.0*  --   --   --   --   --   CREATININE 9.15*  --  9.49*  --   --   --   --   --  9.25*  --   TROPONINIHS  --   --   --  683*  --   --  672*  --   --   --     Assessment: 55 yo male s/p cardiac arrest in ED this morning. Pharmacy consulted for heparin for ACS/chest pain also possible suspicion of PE. Patient is on warfarin prior to admission for AFib - last dose at 1800 prior to ED presentation per patient report though unable to gather home regimen per patient. INR 1.8 last night with INR in process now.   Patient with anaphylaxis allergy to pork. Will use argatroban instead of heparin.   APTT 73 sec  Goal of Therapy:  APTT 50-90 Monitor platelets by anticoagulation protocol: Yes   Plan:   Continue argatroban at 0.66mcg/kg/min Monitor INR, aPTT, CBC and s/s of bleeding daily   Excell Seltzer, PharmD Clinical Pharmacist 01/15/2021 12:13 AM

## 2021-01-15 NOTE — Progress Notes (Addendum)
NAME:  [name] [name], MRN:  [id number], DOB:  02/05/1966, LOS: 1 ADMISSION DATE:  01/13/2021, CONSULTATION DATE: 01/14/2021 REFERRING MD: Emergency department physician, CHIEF COMPLAINT: Status post cardiac arrest on 01/15/2019  History of Present Illness:  [name] [name] is a 55 year old with a plethora of health issues that include CAD post MI, ESRD with multiple failed AV grafts currently requiring right IJ tunneled catheter, poorly controlled DM2, morbid obesity at 286 pounds who was in his usual state of poor health on 01/13/2021 when he arrived to the emergency room on 4/5 with suspected reaction to shellfish.  He received standard protocol for anaphylactic reaction consisting of steroids H2 blockers for which he responded.  He was due for dialysis on 01/14/2021 was being ready for transport out of the emergency room at which time he coded. He coded for less than 5 minutes requiring CPR, did not require intubation or cardioversion, and was A&O post arrest. He was found to be Hyperkalemic, nephrology was consulted, and CRRT was initiated through his tunneled dialysis catheter. PCCM was consulted to manage his multiple medical needs post arrest. He was admitted to the [facility] ICU where he remains in critical condition.  Pertinent  Medical History   Past Medical History:  Diagnosis Date  . Allergic rhinitis   . Allergy   . Anemia   . Aortic insufficiency    SBE in 2016 >> pt declined AVR // a. Echo 7/16:  Mod LVH, EF 50-55%, mild to mod AI, severe LAE, PASP 57 mmHg (LV ID end diastolic 02.7 mm);  b. Echo 2/17:  Mod LVH, EF 50-55%, mod AI, MAC, mild MR, mod LAE, mild to mod TR, PASP 63 mmHg // Echo 2/21: normal EF, mild MR, mild AI  . Atrial fibrillation and flutter ([place])    chronic anticoag with Warfarin // bradycardia noted during admit 10/21  . Bilateral carpal tunnel syndrome   . Bradycardia    brady/low BP event during admit in 10/21 >> poor candidate for pacer/EPS due to high risk of infection  with ESRD; pacer not recommended by EP  . CAD (coronary artery disease)    s/p NSTEMI 10/21: cath>> pLAD 99 >> tx with DES; Lat RI 100 and OM1 100 - med Rx  . Cataract    removed both eyes   . Cholecystitis    dx during admx 10/21 >> HIDA neg // exam benign // high risk for surgery >> no surgical intervention recommended.   . Claustrophobia   . COPD (chronic obstructive pulmonary disease) ([place])   . Coronary artery disease   . Diabetes mellitus    Type II  . Diabetic peripheral neuropathy ([place])   . Diabetic retinopathy ([place])   . Discitis of lumbar region    resolved  . DVT (deep venous thrombosis) ([place]) 10/07/2015   LLE  . Dysrhythmia 07/2020   afib  . Endocarditis 11/28/14  . ESRD (end stage renal disease) on dialysis [facility])    s/p renal transplant in 2009, failed in 2016  . Gait disorder   . Gastroparesis   . GERD (gastroesophageal reflux disease)   . Gout   . Hearing difficulty   . Hemodialysis patient ([place])    tuesday, thursday, saturday   . HFrEF (HF with reduced EF)    Ischemic CM // Echo 10/21: EF 40-45, dist sept and apical AK, mild LVH, normal RVSF, mod MAC  . Hyperlipidemia   . Hypertension   . Incomplete bladder emptying   . Leg pain  02/05/11   with walking  . Morbid obesity ([place])   . Myocardial infarction ([place]) 07/2020  . Obstructive sleep apnea    not using CPAP- lost 100lbs  . Osteomyelitis ([place])   . Posttraumatic stress disorder   . Rotator cuff disorder   . Secondary hyperparathyroidism ([place])   . Sepsis ([place])    Postive blood cultures -   . Sleep apnea    no cpap  . Urethral stricture   . Vitamin D deficiency      Significant Hospital Events: Including procedures, antibiotic start and stop dates in addition to other pertinent events   . 01/14/2021 Cardiac arrest due to HyperK, CPR, ROSC, A&O, Admit to ICU . 4/5 CRRT initiated . 4/5 Tunneled dialysis cath accessed > . 4/5 COVID/FLU > Neg . 4/5 MRSA Swab > Neg . 4/5 BLLE Dopplers> Neg  Interim  History / Subjective:  Presented to ED, Cardiac Arrest, CPR, ROSC, Admit to [place] post arrest  CVVHD initiated  24 hour: Tmax 98.3, HR 80 to 116, Resp 11-26, BP 76-52-121/92, Weight 137.9 KG  Denies any chest pain or SOB, states that he is not in pain, enjoying his breakfast   Objective   Blood pressure (!) 76/60, pulse 89, temperature (!) 94.4 F (34.7 C), temperature source Oral, resp. rate 20, height _0  (1.778 m), weight (!) 137.9 kg, SpO2 100 %. on 2L [place]        Intake/Output Summary (Last 24 hours) at 01/15/2021 0810 Last data filed at 01/15/2021 0720 Gross per 24 hour  Intake 1450.25 ml  Output 1536 ml  Net -85.75 ml   Filed Weights   01/14/21 1000 01/15/21 0600  Weight: (!) 137.9 kg (!) 137.9 kg    Examination: General: Awake, Alert, sitting in bed eating breakfast, chronically ill appearing, obese   HEENT: MM pink/moist, trachea midline, PERRL 41m, anicteric, atraumatic Neuro: GCS 15, RASS 0, MAE CV: s1s2, afib, rate controlled, no m/r/g PULM:  Slight crackles appreciated, chest expansion symmetric, air movement throught GI: soft, bsx4 active, non tender Extremities: warm/dry, no pretibial edema appreciated, cap refill < 3 seconds Skin: Chronic right foot wound   Labs/imaging that I havepersonally reviewed  (right click and "Reselect all SmartList Selections" daily)  ABC, glucose, CBC, CMP, Dopplers, CXR  Resolved Hospital Problem list     Assessment & Plan:   Status post cardiac arrest 01/14/2021 at 8:30 AM with prompt return of pulse and blood pressure.  Note has extensive coronary artery disease history with acute MI in the past. Likely 2nd to Hyperkalemia, did receive chest compressions, trop trending down -Apreciate cardiologist assistance, will follow recommendations -Monitor K level, cont CRRT -More ease in obtaining cuff pressure, will hold on [place] at this time -Will consider restarting home midodrine, plavix, and atorvastatin  Extensive past medical  history of coronary artery disease, PE, DVT and multiple vascular interventions for development of AV fistulas. On Argatroban GTT -Will initiate bridge back to home warfarin -Appreciate pharmacy assistance  End-stage renal disease with electrolyte abnormalities Hyperkalemia  On CVVHDF -Appreciate Nephrology assistance, will follow recommendations -Continue to monitor renal function panel BID -CRRT adjustment per Nephrology recs  Diabetes mellitus CBG (last 3)  Hyperglycemic overnight, insulin GTT initiated -Continue Insulin GTT today, will consider switch back to SSI tomorrow -Continue holding home insulin  History obstructive sleep apnea ABG WNL this AM -Continue Nocturnal CPAP -Wean O2 off, goal 92-98% SPO2  Acute on Chronic Normocytic Anemia Likely chronic, no signs of bleeding -Continue to  monitor CBC and trend -Transfuse at HGB 7  Chronic R Foot Wound Appears to be healing -Continue to monitor  Best practice (right click and "Reselect all SmartList Selections" daily)  Diet:  Oral Pain/Anxiety/Delirium protocol (if indicated): No VAP protocol (if indicated): Not indicated DVT prophylaxis: Systemic AC GI prophylaxis: PPI Glucose control:  Insulin gtt and SSI No Central venous access:  Yes, and it is still needed Arterial line:  N/A Foley:  N/A Mobility:  bed rest  PT consulted: Yes Last date of multidisciplinary goals of care discussion [Updated PT at bedside 4/6] Code Status:  full code Disposition: ICU   Critical care time: 35 min    Redmond School., MSN, APRN, AGACNP-BC [facility]  01/15/2021 , 8:10 AM   Please see [email or web address] for pager details  From 7a-7p if no response, please call [phone] After hours, please call Elink at [phone]

## 2021-01-15 NOTE — Plan of Care (Signed)

## 2021-01-16 ENCOUNTER — Inpatient Hospital Stay (HOSPITAL_COMMUNITY): Payer: Medicare HMO

## 2021-01-16 DIAGNOSIS — I9589 Other hypotension: Secondary | ICD-10-CM | POA: Diagnosis not present

## 2021-01-16 DIAGNOSIS — I469 Cardiac arrest, cause unspecified: Secondary | ICD-10-CM | POA: Diagnosis not present

## 2021-01-16 LAB — MRSA PCR SCREENING: MRSA by PCR: NEGATIVE

## 2021-01-16 LAB — RENAL FUNCTION PANEL
Albumin: 2.9 g/dL — ABNORMAL LOW (ref 3.5–5.0)
Anion gap: 7 (ref 5–15)
BUN: 40 mg/dL — ABNORMAL HIGH (ref 6–20)
CO2: 25 mmol/L (ref 22–32)
Calcium: 8.1 mg/dL — ABNORMAL LOW (ref 8.9–10.3)
Chloride: 99 mmol/L (ref 98–111)
Creatinine, Ser: 4.33 mg/dL — ABNORMAL HIGH (ref 0.61–1.24)
GFR, Estimated: 15 mL/min — ABNORMAL LOW (ref >60)
Glucose, Bld: 198 mg/dL — ABNORMAL HIGH (ref 70–99)
Phosphorus: 4 mg/dL (ref 2.5–4.6)
Potassium: 3.7 mmol/L (ref 3.5–5.1)
Sodium: 131 mmol/L — ABNORMAL LOW (ref 135–145)

## 2021-01-16 LAB — CBC
HCT: 36.3 % — ABNORMAL LOW (ref 39.0–52.0)
Hemoglobin: 10.6 g/dL — ABNORMAL LOW (ref 13.0–17.0)
MCH: 28.9 pg (ref 26.0–34.0)
MCHC: 29.2 g/dL — ABNORMAL LOW (ref 30.0–36.0)
MCV: 98.9 fL (ref 80.0–100.0)
Platelets: 264 10*3/uL (ref 150–400)
RBC: 3.67 MIL/uL — ABNORMAL LOW (ref 4.22–5.81)
RDW: 18 % — ABNORMAL HIGH (ref 11.5–15.5)
WBC: 8.3 10*3/uL (ref 4.0–10.5)
nRBC: 0 % (ref 0.0–0.2)

## 2021-01-16 LAB — GLUCOSE, CAPILLARY
Glucose-Capillary: 126 mg/dL — ABNORMAL HIGH (ref 70–99)
Glucose-Capillary: 134 mg/dL — ABNORMAL HIGH (ref 70–99)
Glucose-Capillary: 138 mg/dL — ABNORMAL HIGH (ref 70–99)
Glucose-Capillary: 151 mg/dL — ABNORMAL HIGH (ref 70–99)
Glucose-Capillary: 158 mg/dL — ABNORMAL HIGH (ref 70–99)
Glucose-Capillary: 158 mg/dL — ABNORMAL HIGH (ref 70–99)
Glucose-Capillary: 166 mg/dL — ABNORMAL HIGH (ref 70–99)
Glucose-Capillary: 174 mg/dL — ABNORMAL HIGH (ref 70–99)
Glucose-Capillary: 182 mg/dL — ABNORMAL HIGH (ref 70–99)
Glucose-Capillary: 182 mg/dL — ABNORMAL HIGH (ref 70–99)
Glucose-Capillary: 192 mg/dL — ABNORMAL HIGH (ref 70–99)
Glucose-Capillary: 200 mg/dL — ABNORMAL HIGH (ref 70–99)
Glucose-Capillary: 239 mg/dL — ABNORMAL HIGH (ref 70–99)
Glucose-Capillary: 266 mg/dL — ABNORMAL HIGH (ref 70–99)

## 2021-01-16 LAB — PROTIME-INR
INR: 3.1 — ABNORMAL HIGH (ref 0.8–1.2)
Prothrombin Time: 31.2 seconds — ABNORMAL HIGH (ref 11.4–15.2)

## 2021-01-16 LAB — MAGNESIUM: Magnesium: 2.5 mg/dL — ABNORMAL HIGH (ref 1.7–2.4)

## 2021-01-16 MED ORDER — PRISMASOL BGK 4/2.5 32-4-2.5 MEQ/L REPLACEMENT SOLN: Status: DC

## 2021-01-16 MED ORDER — INSULIN DETEMIR 100 UNIT/ML ~~LOC~~ SOLN
20.0000 [IU] | Freq: Two times a day (BID) | SUBCUTANEOUS | Status: DC
  Administered 2021-01-16 – 2021-01-17 (×3): 20 [IU] via SUBCUTANEOUS
  Filled 2021-01-16 (×4): qty 0.2

## 2021-01-16 MED ORDER — INSULIN ASPART 100 UNIT/ML ~~LOC~~ SOLN
0.0000 [IU] | Freq: Every day | SUBCUTANEOUS | Status: DC
  Administered 2021-01-16 – 2021-01-17 (×2): 3 [IU] via SUBCUTANEOUS

## 2021-01-16 MED ORDER — INSULIN ASPART 100 UNIT/ML ~~LOC~~ SOLN
0.0000 [IU] | Freq: Three times a day (TID) | SUBCUTANEOUS | Status: DC
  Administered 2021-01-16: 3 [IU] via SUBCUTANEOUS
  Administered 2021-01-16: 5 [IU] via SUBCUTANEOUS
  Administered 2021-01-17: 2 [IU] via SUBCUTANEOUS
  Administered 2021-01-17 – 2021-01-18 (×3): 5 [IU] via SUBCUTANEOUS

## 2021-01-16 MED ORDER — WARFARIN - PHARMACIST DOSING INPATIENT: Freq: Every day | Status: DC

## 2021-01-16 MED ORDER — HEPARIN SODIUM (PORCINE) 1000 UNIT/ML IJ SOLN
INTRAMUSCULAR | Status: AC
  Filled 2021-01-16: qty 1

## 2021-01-16 MED ORDER — CHLORHEXIDINE GLUCONATE CLOTH 2 % EX PADS: 6.0000 | MEDICATED_PAD | Freq: Every day | CUTANEOUS | Status: DC

## 2021-01-16 MED ORDER — WARFARIN SODIUM 2.5 MG PO TABS
2.5000 mg | ORAL_TABLET | Freq: Once | ORAL | Status: AC
  Administered 2021-01-16: 2.5 mg via ORAL
  Filled 2021-01-16: qty 1

## 2021-01-16 MED ORDER — INSULIN ASPART 100 UNIT/ML ~~LOC~~ SOLN
4.0000 [IU] | Freq: Three times a day (TID) | SUBCUTANEOUS | Status: DC
  Administered 2021-01-16 – 2021-01-17 (×3): 4 [IU] via SUBCUTANEOUS

## 2021-01-16 MED ORDER — PRISMASOL BGK 4/2.5 32-4-2.5 MEQ/L EC SOLN: Status: DC

## 2021-01-16 MED ORDER — ANTICOAGULANT SODIUM CITRATE 4% (200MG/5ML) IV SOLN
5.0000 mL | Freq: Once | Status: AC
  Administered 2021-01-16: 5 mL
  Filled 2021-01-16: qty 5

## 2021-01-16 MED ORDER — ALBUMIN HUMAN 25 % IV SOLN
INTRAVENOUS | Status: AC
  Administered 2021-01-16: 25 g
  Filled 2021-01-16: qty 100

## 2021-01-16 NOTE — Evaluation (Signed)
Physical Therapy Evaluation Patient Details Name: Linzy Darling MRN: [id number] DOB: [birth year] Today's Date: 01/16/2021   History of Present Illness  55 yo presented to ED 4/4 with rash and allergic reaction to fish. Awaiting ride for D/C on 4/5 pt with respiratory and cardiac arrest with resuscitation 5 min for ROSC. Pt with hyperkalemia. PMHx; ESRD TTS, metabolic bone disease, DM, right foot wound, obesity  Clinical Impression  Pt supine on arrival and reports having aide 7days a week to assist with homemaking and bathing at times. Pt uses scooter downstairs but does walk upstairs in home. Pt has transportation to HD and reports he wants to be stronger but admittedly limits his own home function with scooter. Pt educated for walking program, use of RW for safety and need to improve activity tolerance and gait. Pt with decreased activity tolerance and gait who will benefit from acute therapy to maximize function and independence.   HR 105 Supine 90/51(63) Sitting 94/67 (71) Spo2 95% on RA    Follow Up Recommendations Home health PT    Equipment Recommendations  Other (comment) (rollator)    Recommendations for Other Services       Precautions / Restrictions Precautions Precautions: Fall Precaution Comments: Rt foot wound      Mobility  Bed Mobility Overal bed mobility: Modified Independent             General bed mobility comments: pt able to transition from supine to sit with HOB 25 degrees and return to supine with HOB flat with increased time    Transfers Overall transfer level: Needs assistance   Transfers: Sit to/from Stand Sit to Stand: Min guard         General transfer comment: mod I to stand from bed, minguard with cues for anterior translation to stand from low chair  Ambulation/Gait Ambulation/Gait assistance: Min guard Gait Distance (Feet): 40 Feet Assistive device: Rolling walker (2 wheeled) Gait Pattern/deviations: Step-through  pattern;Decreased stride length;Trunk flexed;Wide base of support   Gait velocity interpretation: 1.31 - 2.62 ft/sec, indicative of limited community ambulator General Gait Details: cues for posture and proximity to [employer] Mobility    Modified Rankin (Stroke Patients Only)       Balance Overall balance assessment: Needs assistance   Sitting balance-Leahy Scale: Fair Sitting balance - Comments: EOB without assist     Standing balance-Leahy Scale: Fair Standing balance comment: pt able to stand without UE support but benefits from UE support on RW for gait                             Pertinent Vitals/Pain Pain Assessment: No/denies pain    Home Living Family/patient expects to be discharged to:: Private residence Living Arrangements: Alone Available Help at Discharge: Personal care attendant Type of Home: Apartment Home Access: Ramped entrance     Home Layout: Two level;Bed/bath upstairs Home Equipment: Transport planner;Tub bench      Prior Function Level of Independence: Needs assistance   Gait / Transfers Assistance Needed: uses power scooter downstairs, walks upstairs to bedroom and holds onto walls upstairs  ADL's / Homemaking Assistance Needed: has aide 3 hr/7day/wk        Hand Dominance        Extremity/Trunk Assessment   Upper Extremity Assessment Upper Extremity Assessment: Generalized weakness    Lower Extremity Assessment Lower Extremity Assessment: Generalized weakness  Cervical / Trunk Assessment Cervical / Trunk Assessment: Other exceptions Cervical / Trunk Exceptions: increased body habitus  Communication   Communication: No difficulties  Cognition Arousal/Alertness: Awake/alert Behavior During Therapy: WFL for tasks assessed/performed Overall Cognitive Status: Within Functional Limits for tasks assessed                                        General Comments       Exercises     Assessment/Plan    PT Assessment Patient needs continued PT services  PT Problem List Decreased mobility;Decreased activity tolerance;Decreased knowledge of use of DME       PT Treatment Interventions Gait training;Stair training;Functional mobility training;Therapeutic activities;Balance training;DME instruction;Therapeutic exercise;Patient/family education    PT Goals (Current goals can be found in the Care Plan section)  Acute Rehab PT Goals Patient Stated Goal: be able to get a gym membership PT Goal Formulation: With patient Time For Goal Achievement: 01/30/21 Potential to Achieve Goals: Fair    Frequency Min 3X/week   Barriers to discharge Decreased caregiver support      Co-evaluation               AM-PAC PT "6 Clicks" Mobility  Outcome Measure Help needed turning from your back to your side while in a flat bed without using bedrails?: None Help needed moving from lying on your back to sitting on the side of a flat bed without using bedrails?: A Little Help needed moving to and from a bed to a chair (including a wheelchair)?: A Little Help needed standing up from a chair using your arms (e.g., wheelchair or bedside chair)?: A Little Help needed to walk in hospital room?: A Little Help needed climbing 3-5 steps with a railing? : A Little 6 Click Score: 19    End of Session   Activity Tolerance: Patient tolerated treatment well Patient left: in bed;with call bell/phone within reach;with nursing/sitter in room (returned to bed for HD transport) Nurse Communication: Mobility status PT Visit Diagnosis: Other abnormalities of gait and mobility (R26.89);Muscle weakness (generalized) (M62.81);Difficulty in walking, not elsewhere classified (R26.2)    Time: 1937-9024 PT Time Calculation (min) (ACUTE ONLY): 18 min   Charges:   PT Evaluation $PT Eval Moderate Complexity: 1 Mod          Brayson Livesey P, PT Acute Rehabilitation Services Pager:  [phone] Office: [phone]   Jamaris Theard B Caitlen Worth 01/16/2021, 2:00 PM

## 2021-01-16 NOTE — Progress Notes (Signed)
NAME:  [name] [name], MRN:  [id number], DOB:  14-Sep-1966, LOS: 2 ADMISSION DATE:  01/13/2021, CONSULTATION DATE: 01/14/2021 REFERRING MD: Emergency department physician, CHIEF COMPLAINT: Status post cardiac arrest on 01/15/2019  History of Present Illness:  [name] [name] is a 55 year old with a plethora of health issues that include CAD post MI, ESRD with multiple failed AV grafts currently requiring right IJ tunneled catheter, poorly controlled DM2, morbid obesity at 286 pounds who was in his usual state of poor health on 01/13/2021 when he arrived to the emergency room on 4/5 with suspected reaction to shellfish.  He received standard protocol for anaphylactic reaction consisting of steroids H2 blockers for which he responded.  He was due for dialysis on 01/14/2021 was being ready for transport out of the emergency room at which time he coded. He coded for less than 5 minutes requiring CPR, did not require intubation or cardioversion, and was A&O post arrest. He was found to be Hyperkalemic, nephrology was consulted, and CRRT was initiated through his tunneled dialysis catheter. PCCM was consulted to manage his multiple medical needs post arrest. He was admitted to the [facility] ICU where he remains in critical condition.  Pertinent  Medical History   Past Medical History:  Diagnosis Date  . Allergic rhinitis   . Allergy   . Anemia   . Aortic insufficiency    SBE in 2016 >> pt declined AVR // a. Echo 7/16:  Mod LVH, EF 50-55%, mild to mod AI, severe LAE, PASP 57 mmHg (LV ID end diastolic 69.6 mm);  b. Echo 2/17:  Mod LVH, EF 50-55%, mod AI, MAC, mild MR, mod LAE, mild to mod TR, PASP 63 mmHg // Echo 2/21: normal EF, mild MR, mild AI  . Atrial fibrillation and flutter ([place])    chronic anticoag with Warfarin // bradycardia noted during admit 10/21  . Bilateral carpal tunnel syndrome   . Bradycardia    brady/low BP event during admit in 10/21 >> poor candidate for pacer/EPS due to high risk of infection  with ESRD; pacer not recommended by EP  . CAD (coronary artery disease)    s/p NSTEMI 10/21: cath>> pLAD 99 >> tx with DES; Lat RI 100 and OM1 100 - med Rx  . Cataract    removed both eyes   . Cholecystitis    dx during admx 10/21 >> HIDA neg // exam benign // high risk for surgery >> no surgical intervention recommended.   . Claustrophobia   . COPD (chronic obstructive pulmonary disease) ([place])   . Coronary artery disease   . Diabetes mellitus    Type II  . Diabetic peripheral neuropathy ([place])   . Diabetic retinopathy ([place])   . Discitis of lumbar region    resolved  . DVT (deep venous thrombosis) ([place]) 10/07/2015   LLE  . Dysrhythmia 07/2020   afib  . Endocarditis 11/28/14  . ESRD (end stage renal disease) on dialysis [facility])    s/p renal transplant in 2009, failed in 2016  . Gait disorder   . Gastroparesis   . GERD (gastroesophageal reflux disease)   . Gout   . Hearing difficulty   . Hemodialysis patient ([place])    tuesday, thursday, saturday   . HFrEF (HF with reduced EF)    Ischemic CM // Echo 10/21: EF 40-45, dist sept and apical AK, mild LVH, normal RVSF, mod MAC  . Hyperlipidemia   . Hypertension   . Incomplete bladder emptying   . Leg pain  02/05/11   with walking  . Morbid obesity ([place])   . Myocardial infarction ([place]) 07/2020  . Obstructive sleep apnea    not using CPAP- lost 100lbs  . Osteomyelitis ([place])   . Posttraumatic stress disorder   . Rotator cuff disorder   . Secondary hyperparathyroidism ([place])   . Sepsis ([place])    Postive blood cultures -   . Sleep apnea    no cpap  . Urethral stricture   . Vitamin D deficiency      Significant Hospital Events: Including procedures, antibiotic start and stop dates in addition to other pertinent events   . 01/14/2021 Cardiac arrest due to HyperK, CPR, ROSC, A&O, Admit to ICU . 4/5 CRRT initiated> stopped . 4/5 Tunneled dialysis cath accessed > . 4/5 BLLE Dopplers> Neg  Interim History / Subjective:  CVVHD continued  for 23 hours, filter clotted at 6AM  No acute events overnight  24Hour Documented Vitals Tmax 97.8, HR 45- 54, RR 12-22, BP 66/48-123/93,  Subjective: No complaints of pain other than where they did CPR, stated that he did not have any questions for me  Objective   Blood pressure (!) 84/54, pulse 69, temperature 97.9 F (36.6 C), temperature source Oral, resp. rate 18, height _0  (1.778 m), weight 135 kg, SpO2 100 %. on RA        Intake/Output Summary (Last 24 hours) at 01/16/2021 0859 Last data filed at 01/16/2021 0600 Gross per 24 hour  Intake 1403.92 ml  Output 2387 ml  Net -983.08 ml   Filed Weights   01/14/21 1000 01/15/21 0600 01/16/21 0500  Weight: (!) 137.9 kg (!) 137.9 kg 135 kg    Examination: General: Awake, sitting in bed, obese, chronically ill appearing HEENT: MM pink/moist, trachea midline, PERRL 9m, atraumatic Neuro: GCS 15, RASS 0, MAE CV: s1s2, appears to be in NSR on bedside telemetry, no m/r/g PULM:  Lungs clear upper and lower lobes, air movement throught, symmetrical chest expansion GI: soft, bsx4 active, nontender  Extremities: warm/dry, no pretibial edema appreciated, cap refill < 3 seconds   Skin: Chronic wound right foot, appears to be healing   Labs/imaging that I havepersonally reviewed  (right click and "Reselect all SmartList Selections" daily)  Blood glucose, Renal Panel, CBC  Resolved Hospital Problem list     Assessment & Plan:   Status post cardiac arrest 01/14/2021 at 8:30 AM with prompt return of pulse and blood pressure.  Note has extensive coronary artery disease history with acute MI in the past. Likely 2nd to Hyperkalemia, did receive chest compressions, trop trending down. Cards deferring ischemic workup. -Appreciate cardiology assistance, acknowledge sign off -Continue clopidogrel, atorvastatin, and warfarin -Will need follow up with outpatient cards with DT[facility] Must [facility] in June  Extensive past medical history of coronary artery  disease, MI, Afib PE, DVT and multiple vascular interventions for development of AV fistulas. -[place] -Appreciate pharmacy assistance with management  End-stage renal disease with electrolyte abnormalities Electrolyte disturbances: Hyperkalemia, Hyponatrema  CVVHDF stopped at 6AM -Appreciate Neph assistance, will follow for recommendations -Continue to monitor renal function panel -? Transition to HD today per Neph -Electrolyte management per neph  Diabetes mellitus -Continue to monitor BG ACHS -Transition from insulin GTT to SSI with long acting insulin  Chronic Hypotension Asymptomatic. BP improved with home midodrine. -Continue midodrine   History obstructive sleep apnea -Cont Nocturnal CPAP -O2 off, goal SPO2 92-98%  Acute on Chronic Normocytic Anemia Likely chronic, no signs of bleeding,  HGB 10.6 to 10.6 -Continue to monitor for signs of blood loss, will repeat CBC PRN if signs of hemodynamic instability -Transfuse HGB < 7  Allergy Reaction Received steroids and H2 blocker -Continue to monitor  Anxiety -Start Klonopin 0.64m BID  Chronic R Foot Wound Appears to be healing -Continue to monitor  Best practice (right click and "Reselect all SmartList Selections" daily)  Diet:  Oral Pain/Anxiety/Delirium protocol (if indicated): No VAP protocol (if indicated): Not indicated DVT prophylaxis: Systemic AC GI prophylaxis: PPI Glucose control:  Insulin gtt Central venous access:  Yes, and it is still needed Arterial line:  N/A Foley:  N/A Mobility:  OOB  PT consulted: Yes Last date of multidisciplinary goals of care discussion [Updated PT at bedside 4/7] Code Status:  full code Disposition: SD if able to remain off CRRT and tolerate HD   Critical care time: 34 min    TRedmond School, MSN, APRN, AGACNP-BC [facility]  01/16/2021 , 8:59 AM   Please see [email or web address] for pager details  From  7a-7p if no response, please call [phone] After hours, please call Elink at [phone]

## 2021-01-16 NOTE — Progress Notes (Signed)
[name] [name] Progress Note  Subjective: seen in ICU, K+ down to 3.7. no SOB. CXR showed persistent IS edema.   Vitals:   01/16/21 0800 01/16/21 0813 01/16/21 0830 01/16/21 0900  BP: (!) 74/48  101/61 101/64  Pulse: 67  (!) 38 (!) 104  Resp: 15  16 16   Temp:  97.9 F (36.6 C)    TempSrc:  Oral    SpO2: 93%  94% 95%  Weight:      Height:        Exam:  alert, nad   no jvd  Chest bilat rales 1/3 up,  mild abd wall/ hip edema  Cor reg no RG  Abd soft ntnd no ascites,  Ext no LE edema   Alert, NF, ox3  RIJ TDC     OP HD: TTS NW   4.5h  F250  130.5kg  2/2.5 bath  TDC RIJ  Hep none  - Mircera 74mcg IV q 2 weeks (last given 3/10) - Calcitriol 1.85mcg PO q HD  Assessment/Plan: 1.  Cardiac Arrest: while leaving from the ED after Rx for possible anaphylaxis. Less than 78min CPR without Epi or shocks before ROSC.  2.  Hyperkalemia: K > 7 post-arrest, sp CRRT 4/6 - 4/7. High K+ resolved. Will dc CRRT.   3.  Anaphylactic reaction: itching and skin rash after eating shellfish, rx'd in ED with Decadron and benadryl. Will need to see allergist as outpatient. 4.  ESRD: Usual TTS schedule.  5.  Vol overload - w/ pulm edema by CXR, +rales, pt not real symptomatic. Plan HD upstairs today w/ max UF.  6.  Hypotension/volume: chronic hypotension on midodrine. 7kg +. HD today max UF 7.  Anemia: Hgb 11.9 - no ESA needed for now. 8.  Metabolic bone disease: CorrCa slightly high - hold off on calcitriol for now. Resume home renvela 4 ac, hold auryxia until back home.  9.  T2DM 10. R foot wound: Appears to be healing   [name] [name] 01/16/2021, 9:20 AM   Recent Labs  Lab 01/15/21 0528 01/15/21 1434 01/16/21 0030  K 5.2* 4.5 3.7  BUN  --  45* 40*  CREATININE  --  5.09* 4.33*  CALCIUM  --  8.2* 8.1*  PHOS  --  4.9* 4.0  HGB 11.9*  --  10.6*   Inpatient medications: . atorvastatin  80 mg Oral Daily  . Chlorhexidine Gluconate Cloth  6 each Topical Daily  . Chlorhexidine  Gluconate Cloth  6 each Topical Q0600  . clonazePAM  0.5 mg Oral BID  . clopidogrel  75 mg Oral Q breakfast  . insulin aspart  0-15 Units Subcutaneous TID WC  . insulin aspart  0-5 Units Subcutaneous QHS  . insulin aspart  4 Units Subcutaneous TID WC  . insulin detemir  20 Units Subcutaneous BID  . mouth rinse  15 mL Mouth Rinse BID  . midodrine  10 mg Oral TID WC  . pantoprazole (PROTONIX) IV  40 mg Intravenous QHS  . sevelamer carbonate  3,200 mg Oral TID WC  . sodium chloride flush  3 mL Intravenous Q12H   . sodium chloride    . sodium chloride 10 mL/hr at 01/16/21 0900  . insulin 0.9 mL/hr at 01/16/21 0900   sodium chloride, acetaminophen, albuterol, dextrose, diphenhydrAMINE, diphenhydrAMINE-zinc acetate, docusate sodium, polyethylene glycol, sodium chloride flush

## 2021-01-16 NOTE — Progress Notes (Signed)
[place] for Argatroban > Warfarin Indication: chest pain/ACS  Allergies  Allergen Reactions  . Pork-Derived Products Anaphylaxis and Other (See Comments)    Fever also  NOTE: IV HEPARIN IS PORCINE, SO DO NOT GIVE PT HEPARIN PRODUCTS  . Ace Inhibitors Cough  . Hydrocodone Other (See Comments)    Hallucinations    Patient Measurements: Height: _0  (177.8 cm) Weight: 135 kg (297 lb 9.9 oz) IBW/kg (Calculated) : 73 Heparin Dosing Weight: 105.2 kg  Vital Signs: Temp: 97.3 F (36.3 C) (04/06 2151) Temp Source: Axillary (04/06 2151) BP: 84/54 (04/07 0600) Pulse Rate: 69 (04/07 0600)  Labs: Recent Labs    01/14/21 0856 01/14/21 0944 01/14/21 1018 01/14/21 1158 01/14/21 1538 01/14/21 2328 01/15/21 0128 01/15/21 0528 01/15/21 0712 01/15/21 0830 01/15/21 1434 01/16/21 0030  HGB 10.8*  --    < >  --   --   --  10.6* 11.9*  --   --   --  10.6*  HCT 37.8*  --    < >  --   --   --  36.6* 35.0*  --   --   --  36.3*  PLT 250  --   --   --   --   --  240  --   --   --   --  264  APTT  --   --    < >  --    < > 73* 81*  --  85*  --   --   --   LABPROT  --   --    < >  --   --   --   --   --   --  57.8* 41.4* 31.2*  INR  --   --    < >  --   --   --   --   --   --  6.9* 4.5* 3.1*  CREATININE 9.49*  --   --   --    < >  --  7.36*  --   --   --  5.09* 4.33*  TROPONINIHS  --  683*  --  672*  --   --   --   --   --   --   --   --    < > = values in this interval not displayed.    Estimated Creatinine Clearance: 26 mL/min (A) (by C-G formula based on SCr of 4.33 mg/dL (H)).   Medical History: Past Medical History:  Diagnosis Date  . Allergic rhinitis   . Allergy   . Anemia   . Aortic insufficiency    SBE in 2016 >> pt declined AVR // a. Echo 7/16:  Mod LVH, EF 50-55%, mild to mod AI, severe LAE, PASP 57 mmHg (LV ID end diastolic 38.7 mm);  b. Echo 2/17:  Mod LVH, EF 50-55%, mod AI, MAC, mild MR, mod LAE, mild to mod TR, PASP 63 mmHg //  Echo 2/21: normal EF, mild MR, mild AI  . Atrial fibrillation and flutter ([place])    chronic anticoag with Warfarin // bradycardia noted during admit 10/21  . Bilateral carpal tunnel syndrome   . Bradycardia    brady/low BP event during admit in 10/21 >> poor candidate for pacer/EPS due to high risk of infection with ESRD; pacer not recommended by EP  . CAD (coronary artery disease)    s/p NSTEMI 10/21: cath>> pLAD 99 >> tx  with DES; Lat RI 100 and OM1 100 - med Rx  . Cataract    removed both eyes   . Cholecystitis    dx during admx 10/21 >> HIDA neg // exam benign // high risk for surgery >> no surgical intervention recommended.   . Claustrophobia   . COPD (chronic obstructive pulmonary disease) ([place])   . Coronary artery disease   . Diabetes mellitus    Type II  . Diabetic peripheral neuropathy ([place])   . Diabetic retinopathy ([place])   . Discitis of lumbar region    resolved  . DVT (deep venous thrombosis) ([employer]) 10/07/2015   LLE  . Dysrhythmia 07/2020   afib  . Endocarditis 11/28/14  . ESRD (end stage renal disease) on dialysis [facility])    s/p renal transplant in 2009, failed in 2016  . Gait disorder   . Gastroparesis   . GERD (gastroesophageal reflux disease)   . Gout   . Hearing difficulty   . Hemodialysis patient ([place])    tuesday, thursday, saturday   . HFrEF (HF with reduced EF)    Ischemic CM // Echo 10/21: EF 40-45, dist sept and apical AK, mild LVH, normal RVSF, mod MAC  . Hyperlipidemia   . Hypertension   . Incomplete bladder emptying   . Leg pain 02/05/11   with walking  . Morbid obesity ([place])   . Myocardial infarction ([place]) 07/2020  . Obstructive sleep apnea    not using CPAP- lost 100lbs  . Osteomyelitis ([place])   . Posttraumatic stress disorder   . Rotator cuff disorder   . Secondary hyperparathyroidism ([place])   . Sepsis ([place])    Postive blood cultures -   . Sleep apnea    no cpap  . Urethral stricture   . Vitamin D deficiency       Assessment: 55 yo male  s/p cardiac arrest in ED this morning. Pharmacy consulted for heparin for ACS/chest pain also possible suspicion of PE. Patient is on warfarin prior to admission for Afib and hx DVT. Was started on argatroban given anaphylaxis allergy to pork-derived products. ACS ruled out, and CCM not concerned for PE. Pharmacy asked to restart warfarin.  INR last night came back at 4.5, warfarin was held. INR down trending down appropriately to 3.1. Hgb 10.6, plt 254. Oral intake documented at 100%.  PTA regimen is 5 mg daily.   Goal of Therapy:  INR 2-3 Monitor platelets by anticoagulation protocol: Yes   Plan:   Warfarin 2.5 mg daily  Monitor daily INR, CBC, s/sx bleeding  Antonietta Jewel, PharmD, BCCCP Clinical Pharmacist  Phone: [phone] 01/16/2021 7:53 AM  Please check AMION for all [place] phone numbers After 10:00 PM, call [place] [phone] .

## 2021-01-17 ENCOUNTER — Ambulatory Visit: Payer: Medicare HMO | Admitting: Internal Medicine

## 2021-01-17 ENCOUNTER — Other Ambulatory Visit: Payer: Self-pay | Admitting: *Deleted

## 2021-01-17 ENCOUNTER — Encounter (HOSPITAL_COMMUNITY): Payer: Self-pay | Admitting: Pulmonary Disease

## 2021-01-17 LAB — RENAL FUNCTION PANEL
Albumin: 3.1 g/dL — ABNORMAL LOW (ref 3.5–5.0)
Anion gap: 13 (ref 5–15)
BUN: 35 mg/dL — ABNORMAL HIGH (ref 6–20)
CO2: 22 mmol/L (ref 22–32)
Calcium: 8.4 mg/dL — ABNORMAL LOW (ref 8.9–10.3)
Chloride: 99 mmol/L (ref 98–111)
Creatinine, Ser: 4.87 mg/dL — ABNORMAL HIGH (ref 0.61–1.24)
GFR, Estimated: 13 mL/min — ABNORMAL LOW (ref >60)
Glucose, Bld: 216 mg/dL — ABNORMAL HIGH (ref 70–99)
Phosphorus: 3.7 mg/dL (ref 2.5–4.6)
Potassium: 4.7 mmol/L (ref 3.5–5.1)
Sodium: 134 mmol/L — ABNORMAL LOW (ref 135–145)

## 2021-01-17 LAB — GLUCOSE, CAPILLARY
Glucose-Capillary: 229 mg/dL — ABNORMAL HIGH (ref 70–99)
Glucose-Capillary: 224 mg/dL — ABNORMAL HIGH (ref 70–99)
Glucose-Capillary: 143 mg/dL — ABNORMAL HIGH (ref 70–99)
Glucose-Capillary: 282 mg/dL — ABNORMAL HIGH (ref 70–99)

## 2021-01-17 LAB — PROTIME-INR
INR: 2.1 — ABNORMAL HIGH (ref 0.8–1.2)
Prothrombin Time: 23 seconds — ABNORMAL HIGH (ref 11.4–15.2)

## 2021-01-17 LAB — MAGNESIUM: Magnesium: 2.2 mg/dL (ref 1.7–2.4)

## 2021-01-17 MED ORDER — ATORVASTATIN CALCIUM 80 MG PO TABS: 80.0000 mg | ORAL_TABLET | Freq: Every day | ORAL | 3 refills | Status: AC

## 2021-01-17 MED ORDER — INSULIN ASPART 100 UNIT/ML ~~LOC~~ SOLN
6.0000 [IU] | Freq: Three times a day (TID) | SUBCUTANEOUS | Status: DC
  Administered 2021-01-17 – 2021-01-18 (×3): 6 [IU] via SUBCUTANEOUS

## 2021-01-17 MED ORDER — WARFARIN SODIUM 5 MG PO TABS
5.0000 mg | ORAL_TABLET | Freq: Once | ORAL | Status: AC
  Administered 2021-01-17: 5 mg via ORAL
  Filled 2021-01-17: qty 1

## 2021-01-17 MED ORDER — INSULIN DETEMIR 100 UNIT/ML ~~LOC~~ SOLN
25.0000 [IU] | Freq: Two times a day (BID) | SUBCUTANEOUS | Status: DC
  Administered 2021-01-17 – 2021-01-18 (×2): 25 [IU] via SUBCUTANEOUS
  Filled 2021-01-17 (×4): qty 0.25

## 2021-01-17 MED ORDER — PANTOPRAZOLE SODIUM 40 MG PO TBEC: 40.0000 mg | DELAYED_RELEASE_TABLET | Freq: Every day | ORAL | 3 refills | Status: AC

## 2021-01-17 NOTE — Progress Notes (Signed)
RE: Pork Derived Product Allergy  Spoke with patient in regards to pork allergy. Patient had an allergic reaction episode in 1989 after he ate pork at a fair. He developed fever and a rash and went to the ED, but he did not have breathing issues. Dr. Jonnie Finner reports that patient has received heparin outpatient with HD. Patient agreeable to removing pork as an allergy and listing it as an intolerance instead.  Allergy list has been updated. OK to give heparin if needed in the future.   Richardine Service, PharmD, BCPS PGY2 Cardiology Pharmacy Resident Phone: [phone] 01/17/2021  12:40 PM  Please check [email or web address] for unit-specific pharmacy phone numbers.

## 2021-01-17 NOTE — Telephone Encounter (Signed)
Ok to fill. Thanks, [name]

## 2021-01-17 NOTE — Progress Notes (Signed)
PROGRESS NOTE    [name] [name]  DJS:[id number] DOB: 1966-04-26 DOA: 01/13/2021 PCP: Hoyt Koch, MD   Brief Narrative:  This 55 years old male with PMH significant for CAD post MI, ESRD with multiple failed AV grafts currently getting hemodialysis through right IJ tunneled catheter, poorly controlled diabetes, morbid obesity who presents in the emergency department with suspected reaction to shellfish.  Patient was found to have anaphylactic reaction and was given the steroids, H2 blockers which he responded well. He was due for hemodialysis and was being ready for transport out of the ER at which he coded, He has received CPR for less than 3 minutes,  did not require intubation or cardioversion.  Patient was alert and oriented post arrest.  He was found to be hyperkalemic, Nephrology was consulted. Patient was started on CRRT through this tunnelled catheter,  Patient was admitted in ICU.  He is hemodynamically stable .  PCCM pickup 4/8.  Significant Hospital events.  01/14/2021 Cardiac arrest due to HyperK, CPR, ROSC, A&O, Admit to ICU  4/5 CRRT initiated> stopped  4/5 Tunneled dialysis cath accessed >  4/5 BLLE Dopplers> Neg  4/8: PCCM pick up.   Assessment & Plan:   Active Problems:   Cardiac arrest [facility])   Status post cardiac arrest with ROSC. Patient has extensive coronary artery disease with acute MI in the past. Patient while being transported to the hemodialysis,  had a cardiac arrest requiring CPR for less than 2 minutes. Likely 2nd to Hyperkalemia, did receive chest compressions, trop trending down. Cardiology deferred ischemic workup. Appreciate cardiology assistance, acknowledge sign off. Continue clopidogrel, atorvastatin, and warfarin Will need follow up with outpatient cards with [facility] in June.   CAD, MI, Afib PE, DVT and multiple vascular interventions for development of AV fistulas. -Transitioned from [place] to Warfarin 01/16/21. -Appreciate  pharmacy assistance with management  End-stage renal disease with electrolyte abnormalities: Electrolyte disturbances: Hyperkalemia, Hyponatrema  CRRT started 6 am on 01/16/21. Appreciate Neph assistance, will follow for recommendations Continue to monitor renal function panel Transition to HD as per schedule per Neph  Diabetes mellitus:  -Continue to monitor BG ACHS -Transitioned from insulin GTT to SSI with long acting insulin. Continue Lantus 25 units twice daily and NovoLog 6 units 3 times daily with meals  Chronic Hypotension: Asymptomatic. BP improved with home midodrine. -Continue midodrine   History obstructive sleep apnea -Cont Nocturnal CPAP. -O2 off, goal SPO2 92-98%  Acute on Chronic Normocytic Anemia Likely chronic, no signs of bleeding, HGB 10.6 to 10.6 Continue to monitor for signs of blood loss, will repeat CBC PRN if signs of hemodynamic instability Transfuse HGB < 7  Allergy Reaction:  Received steroids and H2 blocker Continue to monitor  Anxiety: Continue Klonopin 0.5mg  BID  Chronic R Foot Wound: Appears to be healing Continue to monitor.    DVT prophylaxis: heparin Code Status: Full code. Family Communication:  No family at bed side. Disposition Plan:Status is: Inpatient  Remains inpatient appropriate because:Inpatient level of care appropriate due to severity of illness   Dispo: The patient is from: Home              Anticipated d/c is to: Home              Patient currently is not medically stable to d/c.   Difficult to place patient No   Consultants:   PCCM,   Nephrology  Procedures:  Antimicrobials:  Anti-infectives (From admission, onward)   None  Subjective: Patient was seen and examined at bedside.  Overnight events noted.  Patient is alert and oriented x 3,  feels much improved,  Patient denies any chest pain or shortness of breath.  Objective: Vitals:   01/17/21 1100 01/17/21 1118 01/17/21 1130 01/17/21 1200   BP: 100/63   111/63  Pulse: (!) 101  (!) 104 95  Resp: 17  19 18   Temp:  98.1 F (36.7 C)    TempSrc:  Oral    SpO2: 100%  100% 100%  Weight:      Height:        Intake/Output Summary (Last 24 hours) at 01/17/2021 1454 Last data filed at 01/17/2021 1000 Gross per 24 hour  Intake 650 ml  Output 3000 ml  Net -2350 ml   Filed Weights   01/16/21 1215 01/16/21 1557 01/17/21 0500  Weight: 134 kg 131 kg 132.2 kg    Examination:  General exam: Appears calm and comfortable, not in any acute distress. Respiratory system: Clear to auscultation. Respiratory effort normal. Cardiovascular system: S1 & S2 heard, RRR. No JVD, murmurs, rubs, gallops or clicks. No pedal edema. Gastrointestinal system: Abdomen is nondistended, soft and nontender. No organomegaly or masses felt.  Normal bowel sounds heard. Central nervous system: Alert and oriented. No focal neurological deficits. Extremities: Symmetric 5 x 5 power. No edema, no cyanosis, no clubbing. Skin: No rashes, lesions or ulcers Psychiatry: Judgement and insight appear normal. Mood & affect appropriate.     Data Reviewed: I have personally reviewed following labs and imaging studies  CBC: Recent Labs  Lab 01/13/21 2311 01/14/21 0856 01/14/21 1052 01/15/21 0128 01/15/21 0528 01/16/21 0030  WBC 6.0 5.4  --  7.5  --  8.3  NEUTROABS 4.0 4.7  --   --   --   --   HGB 11.6* 10.8* 11.9* 10.6* 11.9* 10.6*  HCT 39.7 37.8* 35.0* 36.6* 35.0* 36.3*  MCV 98.5 101.6*  --  98.7  --  98.9  PLT 281 250  --  240  --  101   Basic Metabolic Panel: Recent Labs  Lab 01/14/21 0856 01/14/21 1052 01/14/21 1709 01/14/21 2328 01/15/21 0128 01/15/21 0528 01/15/21 1434 01/16/21 0030 01/17/21 0615  NA 135   < > 133*  --  135 135 133* 131* 134*  K 6.9*   < > 6.8*   < > 5.7* 5.2* 4.5 3.7 4.7  CL 99  --  98  --  100  --  100 99 99  CO2 21*  --  24  --  24  --  23 25 22   GLUCOSE 387*  --  260*  --  157*  --  229* 198* 216*  BUN 61*  --  63*  --   54*  --  45* 40* 35*  CREATININE 9.49*  --  9.25*  --  7.36*  --  5.09* 4.33* 4.87*  CALCIUM 9.4  --  9.2  --  8.9  --  8.2* 8.1* 8.4*  MG 2.3  --   --   --  2.6*  --   --  2.5* 2.2  PHOS  --   --  5.6*  --  6.3*  --  4.9* 4.0 3.7   < > = values in this interval not displayed.   GFR: Estimated Creatinine Clearance: 22.9 mL/min (A) (by C-G formula based on SCr of 4.87 mg/dL (H)). Liver Function Tests: Recent Labs  Lab 01/13/21 2311 01/14/21 0856 01/14/21 1709 01/15/21 0128  01/15/21 1434 01/16/21 0030 01/17/21 0615  AST 26 25  --   --   --   --   --   ALT 13 18  --   --   --   --   --   ALKPHOS 60 63  --   --   --   --   --   BILITOT 1.3* 1.2  --   --   --   --   --   PROT 7.1 6.9  --   --   --   --   --   ALBUMIN 3.1* 2.9* 3.2* 3.1* 2.9* 2.9* 3.1*   No results for input(s): LIPASE, AMYLASE in the last 168 hours. No results for input(s): AMMONIA in the last 168 hours. Coagulation Profile: Recent Labs  Lab 01/14/21 1018 01/15/21 0830 01/15/21 1434 01/16/21 0030 01/17/21 0615  INR 2.0* 6.9* 4.5* 3.1* 2.1*   Cardiac Enzymes: No results for input(s): CKTOTAL, CKMB, CKMBINDEX, TROPONINI in the last 168 hours. BNP (last 3 results) No results for input(s): PROBNP in the last 8760 hours. HbA1C: No results for input(s): HGBA1C in the last 72 hours. CBG: Recent Labs  Lab 01/16/21 1441 01/16/21 1725 01/16/21 2118 01/17/21 0643 01/17/21 1116  GLUCAP 174* 239* 266* 229* 224*   Lipid Profile: No results for input(s): CHOL, HDL, LDLCALC, TRIG, CHOLHDL, LDLDIRECT in the last 72 hours. Thyroid Function Tests: No results for input(s): TSH, T4TOTAL, FREET4, T3FREE, THYROIDAB in the last 72 hours. Anemia Panel: No results for input(s): VITAMINB12, FOLATE, FERRITIN, TIBC, IRON, RETICCTPCT in the last 72 hours. Sepsis Labs: Recent Labs  Lab 01/14/21 1018  PROCALCITON 0.73    Recent Results (from the past 240 hour(s))  Resp Panel by RT-PCR (Flu A&B, Covid) Nasopharyngeal  Swab     Status: None   Collection Time: 01/14/21  9:17 AM   Specimen: Nasopharyngeal Swab; Nasopharyngeal(NP) swabs in vial transport medium  Result Value Ref Range Status   SARS Coronavirus 2 by RT PCR NEGATIVE NEGATIVE Final    Comment: (NOTE) SARS-CoV-2 target nucleic acids are NOT DETECTED.  The SARS-CoV-2 RNA is generally detectable in upper respiratory specimens during the acute phase of infection. The lowest concentration of SARS-CoV-2 viral copies this assay can detect is 138 copies/mL. A negative result does not preclude SARS-Cov-2 infection and should not be used as the sole basis for treatment or other patient management decisions. A negative result may occur with  improper specimen collection/handling, submission of specimen other than nasopharyngeal swab, presence of viral mutation(s) within the areas targeted by this assay, and inadequate number of viral copies(<138 copies/mL). A negative result must be combined with clinical observations, patient history, and epidemiological information. The expected result is Negative.  Fact Sheet for Patients:  EntrepreneurPulse.com.au  Fact Sheet for Healthcare Providers:  IncredibleEmployment.be  This test is no t yet approved or cleared by the [place] FDA and  has been authorized for detection and/or diagnosis of SARS-CoV-2 by FDA under an Emergency Use Authorization (EUA). This EUA will remain  in effect (meaning this test can be used) for the duration of the COVID-19 declaration under Section 564(b)(1) of the Act, 21 U.S.C.section 360bbb-3(b)(1), unless the authorization is terminated  or revoked sooner.       Influenza A by PCR NEGATIVE NEGATIVE Final   Influenza B by PCR NEGATIVE NEGATIVE Final    Comment: (NOTE) The Xpert Xpress SARS-CoV-2/FLU/RSV plus assay is intended as an aid in the diagnosis of influenza from Nasopharyngeal  swab specimens and should not be used as a sole  basis for treatment. Nasal washings and aspirates are unacceptable for Xpert Xpress SARS-CoV-2/FLU/RSV testing.  Fact Sheet for Patients: EntrepreneurPulse.com.au  Fact Sheet for Healthcare Providers: IncredibleEmployment.be  This test is not yet approved or cleared by the [place] FDA and has been authorized for detection and/or diagnosis of SARS-CoV-2 by FDA under an Emergency Use Authorization (EUA). This EUA will remain in effect (meaning this test can be used) for the duration of the COVID-19 declaration under Section 564(b)(1) of the Act, 21 U.S.C. section 360bbb-3(b)(1), unless the authorization is terminated or revoked.  Performed at [facility], [place] [address], [place], [place] [address]   MRSA PCR Screening     Status: None   Collection Time: 01/15/21  9:44 PM   Specimen: Nasal Mucosa; Nasopharyngeal  Result Value Ref Range Status   MRSA by PCR NEGATIVE NEGATIVE Final    Comment:        The GeneXpert MRSA Assay (FDA approved for NASAL specimens only), is one component of a comprehensive MRSA colonization surveillance program. It is not intended to diagnose MRSA infection nor to guide or monitor treatment for MRSA infections. Performed at [facility], [place] [address] [place], [place] [address]     Radiology Studies: DG CHEST PORT 1 VIEW  Result Date: 01/16/2021 CLINICAL DATA:  Neuritis. EXAM: PORTABLE CHEST 1 VIEW COMPARISON:  January 14, 2021. FINDINGS: Stable cardiomegaly with mild central pulmonary vascular congestion. Right internal jugular dialysis catheter is unchanged. No pneumothorax or significant pleural effusion is noted. Mildly increased interstitial densities are noted in both lungs which may represent edema. Bony thorax is unremarkable. IMPRESSION: Stable cardiomegaly with central pulmonary vascular congestion. Probable mild bilateral pulmonary edema is noted as well. Electronically Signed   By: Marijo Conception M.D.   On: 01/16/2021 08:11   Scheduled Meds: . atorvastatin  80 mg Oral Daily  . Chlorhexidine Gluconate Cloth  6 each Topical Daily  . clonazePAM  0.5 mg Oral BID  . clopidogrel  75 mg Oral Q breakfast  . insulin aspart  0-15 Units Subcutaneous TID WC  . insulin aspart  0-5 Units Subcutaneous QHS  . insulin aspart  6 Units Subcutaneous TID WC  . insulin detemir  25 Units Subcutaneous BID  . mouth rinse  15 mL Mouth Rinse BID  . midodrine  10 mg Oral TID WC  . pantoprazole (PROTONIX) IV  40 mg Intravenous QHS  . sevelamer carbonate  3,200 mg Oral TID WC  . sodium chloride flush  3 mL Intravenous Q12H  . warfarin  5 mg Oral ONCE-1600  . Warfarin - Pharmacist Dosing Inpatient   Does not apply q1600   Continuous Infusions: . sodium chloride    . sodium chloride Stopped (01/16/21 1155)  . insulin Stopped (01/16/21 1154)     LOS: 3 days    Time spent: 35 mins    Aceton Kinnear, MD [facility]   If 7PM-7AM, please contact night-coverage

## 2021-01-17 NOTE — Progress Notes (Signed)
PCCM Interval Note  Patient tolerated HD yesterday. No need for CRRT. Transferred to [facility] for pick-up on 4/8.  Rodman Pickle, M.D. [facility] Pulmonary/Critical Care Medicine 01/17/2021 8:09 AM

## 2021-01-17 NOTE — Progress Notes (Signed)
[facility] Progress Note  Subjective: seen in ICU, K+ 4.7.  3000 cc off w/ HD yest. No c/o today.   Vitals:   01/17/21 0418 01/17/21 0500 01/17/21 0600 01/17/21 0728  BP: 94/69     Pulse: 87 88 100   Resp: 13 17 (!) 21   Temp:    98.3 F (36.8 C)  TempSrc:    Oral  SpO2: 100% 100% 100%   Weight:  132.2 kg    Height:        Exam:  alert, nad   no jvd  Chest bilat rales 1/3 up,  mild abd wall/ hip edema  Cor reg no RG  Abd soft ntnd no ascites,  Ext no LE edema   Alert, NF, ox3  RIJ TDC     OP HD: TTS NW   4.5h  F250  130.5kg  2/2.5 bath  TDC RIJ  Hep none  - Mircera 31mcg IV q 2 weeks (last given 3/10)  - Calcitriol 1.65mcg PO q HD  Assessment/Plan: 1.  Cardiac Arrest: while leaving from the ED after Rx for possible anaphylaxis. Less than 32min CPR without Epi or shocks before ROSC.  2.  Hyperkalemia: K+ 7 post-arrest, sp CRRT 4/6 - 4/7.  Resolved. 3.  Anaphylactic reaction: itching and skin rash after eating shellfish, rx'd in ED with Decadron and benadryl. Will need to see allergist as outpatient.  4.  ESRD: Usual TTS schedule. SP CRRT overnight 4/6- 4/7. Regular HD yesterday 4/7 w/o incident. Next HD 4/9.  5.  Vol overload - w/ pulm edema by CXR 4/7, got 3 L off yest on HD. Only 2kg up now, better, on room air.  6.  Chron hypotension: on midodrine tid 7.  Question of heparin allergy: we have been giving him catheter block heparin for years w/o problems. Have asked pharm to remove his heparin allergy, appreciate assistance.  8.  Anemia: Hgb >10, no ESA needed for now. 9.  Metabolic bone disease: CorrCa slightly high - hold off on calcitriol for now. Resume home renvela 4 ac. Resume auryxia on dc.  10.  T2DM 11.   R foot wound: Appears to be healing   [name] [name] 01/17/2021, 8:27 AM   Recent Labs  Lab 01/15/21 0528 01/15/21 1434 01/16/21 0030  K 5.2* 4.5 3.7  BUN  --  45* 40*  CREATININE  --  5.09* 4.33*  CALCIUM  --  8.2* 8.1*  PHOS  --  4.9*  4.0  HGB 11.9*  --  10.6*   Inpatient medications: . atorvastatin  80 mg Oral Daily  . Chlorhexidine Gluconate Cloth  6 each Topical Daily  . clonazePAM  0.5 mg Oral BID  . clopidogrel  75 mg Oral Q breakfast  . insulin aspart  0-15 Units Subcutaneous TID WC  . insulin aspart  0-5 Units Subcutaneous QHS  . insulin aspart  4 Units Subcutaneous TID WC  . insulin detemir  20 Units Subcutaneous BID  . mouth rinse  15 mL Mouth Rinse BID  . midodrine  10 mg Oral TID WC  . pantoprazole (PROTONIX) IV  40 mg Intravenous QHS  . sevelamer carbonate  3,200 mg Oral TID WC  . sodium chloride flush  3 mL Intravenous Q12H  . Warfarin - Pharmacist Dosing Inpatient   Does not apply q1600   . sodium chloride    . sodium chloride Stopped (01/16/21 1155)  . insulin Stopped (01/16/21 1154)   sodium chloride, acetaminophen, albuterol, dextrose,  diphenhydrAMINE, diphenhydrAMINE-zinc acetate, docusate sodium, polyethylene glycol, sodium chloride flush

## 2021-01-17 NOTE — Progress Notes (Signed)
Inpatient Diabetes Program Recommendations  AACE/ADA: New Consensus Statement on Inpatient Glycemic Control (2015)  Target Ranges:  Prepandial:   less than 140 mg/dL      Peak postprandial:   less than 180 mg/dL (1-2 hours)      Critically ill patients:  140 - 180 mg/dL   Lab Results  Component Value Date   GLUCAP 229 (H) 01/17/2021   HGBA1C 7.2 (H) 01/14/2021    Review of Glycemic Control Results for [name] [name], [name] [name] (MRN [id number]) as of 01/17/2021 09:00  Ref. Range 01/16/2021 17:25 01/16/2021 21:18 01/17/2021 06:43  Glucose-Capillary Latest Ref Range: 70 - 99 mg/dL 239 (H) 266 (H) 229 (H)   Diabetes history:  DM2 Outpatient Diabetes medications:  Toujeo 80 units BID U-500 50 units with breakfast, 70 units with lunch, 60 units with supper Current orders for Inpatient glycemic control:  Lantus 20 units BID Novolog 0-15 units TID  Novolog 0-5 units QHS Novolog 4 units TID with meals   Inpatient Diabetes Program Recommendations:     Please consider increasing Lantus to 25 units BID & Novolog 6 units TID with meals if eats at least 50%.  Will continue to follow while inpatient.  Thank you, Reche Dixon, RN, BSN Diabetes Coordinator Inpatient Diabetes Program [phone] (team pager from 8a-5p)

## 2021-01-17 NOTE — TOC Initial Note (Signed)
Transition of Care [facility]) - Initial/Assessment Note    Patient Details  Name: [name] [name] MRN: [id number] Date of Birth: [birth year]  Transition of Care [facility]) CM/SW Contact:    Bethena Roys, RN Phone Number: 01/17/2021, 1:25 PM  Clinical Narrative: Risk for readmission assessment completed. Prior to arrival patient was from home alone. Patient has a primary care provider and he has transportation via [employer]. Patient has HD on TTS and he has a power wheelchair that he uses. Patient is not in any need of durable medical equipment. Patient states he lives in an apartment and is trying to get one on the ground level. Case Manager discussed home health services-patient has declined and wants to go outpatient for PT-states the area in his apartment is too small. Patient will need an ambulatory referral to the [address] location for evaluation and treatment once he gets closer to discharge. Case Manager will continue to follow for additional transition of care needs.                  Expected Discharge Plan: Home/Self Care Barriers to Discharge: Continued Medical Work up   Patient Goals and CMS Choice Patient states their goals for this hospitalization and ongoing recovery are:: to return to his apartment- in the process of trying to get a ground floor apartment.   Choice offered to / list presented to : NA  Expected Discharge Plan and Services Expected Discharge Plan: Home/Self Care In-house Referral: NA Discharge Planning Services: CM Consult Post Acute Care Choice: NA Living arrangements for the past 2 months: Apartment                   DME Agency: NA       HH Arranged: Refused HH (Pt states his apartment is small and that he would rather go outpatient for services.) Arab Agency: NA        Prior Living Arrangements/Services Living arrangements for the past 2 months: Apartment Lives with:: Self Patient language and need for interpreter reviewed::  Yes Do you feel safe going back to the place where you live?: Yes      Need for Family Participation in Patient Care: No (Comment) Care giver support system in place?: No (comment) Current home services: Homehealth aide (personal care services via Medicaid.) Criminal Activity/Legal Involvement Pertinent to Current Situation/Hospitalization: No - Comment as needed  Activities of Daily Living      Permission Sought/Granted Permission sought to share information with : Case Manager                Emotional Assessment Appearance:: Appears stated age Attitude/Demeanor/Rapport: Engaged Affect (typically observed): Appropriate Orientation: : Oriented to Self,Oriented to Place,Oriented to  Time,Oriented to Situation Alcohol / Substance Use: Not Applicable Psych Involvement: No (comment)  Admission diagnosis:  Cardiac arrest ([place]) [I46.9] Hyperkalemia [E87.5] Hives [L50.9] Allergic reaction, initial encounter [T78.40XA] Patient Active Problem List   Diagnosis Date Noted  . Cardiac arrest ([place]) 01/14/2021  . Hemodialysis catheter dysfunction ([place]) 11/08/2020  . ESRD (end stage renal disease) ([place]) 11/08/2020  . Bradycardia 08/03/2020  . Hypotension 08/02/2020  . CAD (coronary artery disease) 08/02/2020  . Shock - Cardiogenic vs Septic   . NSTEMI (non-ST elevated myocardial infarction) ([place]) 07/27/2020  . Atrial Fibrillation/Flutter 07/27/2020  . Band keratopathy of both eyes 11/10/2019  . Pseudophakia of both eyes 11/10/2019  . Routine general medical examination at a health care facility 07/21/2019  . Complication of vascular dialysis catheter 07/17/2019  .  Cholecystitis 04/20/2019  . Allergy, unspecified, sequela 12/29/2018  . Pain, unspecified 12/29/2018  . Leg lesion 06/16/2018  . Protein calorie malnutrition ([place]) 05/09/2018  . Hyperkalemia 04/11/2018  . Hemorrhoid 02/28/2018  . Numbness 10/20/2017  . Fluid overload, unspecified 06/04/2016  . Hypocalcemia 02/20/2016   . Anterior urethral stricture 12/09/2015  . Iron deficiency anemia, unspecified 11/30/2015  . Dependence on renal dialysis ([place]) 11/27/2015  . ESRD on dialysis ([place]) 11/15/2015  . Coagulation defect, unspecified ([place]) 11/02/2015  . HFrEF (heart failure with reduced ejection fraction) ([place]) 10/07/2015  . Prolonged Q-T interval on ECG 10/07/2015  . Chronic deep vein thrombosis (DVT) of left lower extremity ([place]) 10/07/2015  . Hypomagnesemia 06/26/2015  . Aortic insufficiency 04/12/2015  . Endocarditis 12/04/2014  . Lumbar discitis 11/22/2014  . Back pain at L4-L5 level   . Anemia due to chronic kidney disease   . Cervical radiculopathy   . Diabetic peripheral neuropathy ([place]) 10/16/2014  . Hematuria 08/06/2014  . Renal transplant, status post 04/05/2014  . OSA (obstructive sleep apnea) 04/05/2014  . Severe obesity (BMI >= 40) ([place]) 04/05/2014  . Benign fibroma of prostate 02/05/2014  . Bladder neck obstruction 02/05/2014  . Visceral obesity 04/08/2012  . Male infertility 03/28/2012  . Immunosuppressive management encounter following kidney transplant 03/24/2012  . Diabetic macular edema ([place]) 01/15/2012  . Diabetes mellitus, type 2 ([place]) 01/15/2012  . IMMUNOCOMPROMISED 01/30/2010  . Diarrhea 01/30/2010  . HYPERPARATHYROIDISM, SECONDARY 01/27/2010  . GOUT 01/27/2010  . Anxiety 01/27/2010  . INSOMNIA 01/27/2010  . HLD (hyperlipidemia) 05/25/2008  . Essential hypertension 05/25/2008   PCP:  Hoyt Koch, MD Pharmacy:   [facility] [place], [address] [place] [address] Phone: [phone] Fax: [phone]   Readmission Risk Interventions Readmission Risk Prevention Plan 01/17/2021 08/04/2020  Transportation Screening Complete -  PCP or Specialist Appt within 3-5 Days - Complete  HRI or [place] - Complete  Social Work Consult for Recovery Care Planning/Counseling -  Complete  Palliative Care Screening - Complete  Medication Review Press photographer) Complete Complete  PCP or Specialist appointment within 3-5 days of discharge Complete -  [place] or Home Care Consult Complete -  SW Recovery Care/Counseling Consult Complete -  Palliative Care Screening Not Applicable -  [place] Not Applicable -  Some recent data might be hidden

## 2021-01-17 NOTE — Progress Notes (Signed)
[place] for Argatroban > Warfarin Indication: chest pain/ACS  Allergies  Allergen Reactions  . Pork-Derived Products Anaphylaxis and Other (See Comments)    Fever also  NOTE: IV HEPARIN IS PORCINE, SO DO NOT GIVE PT HEPARIN PRODUCTS  . Ace Inhibitors Cough  . Hydrocodone Other (See Comments)    Hallucinations    Patient Measurements: Height: _0  (177.8 cm) Weight: 132.2 kg (291 lb 7.2 oz) IBW/kg (Calculated) : 73 Heparin Dosing Weight: 105.2 kg  Vital Signs: Temp: 98.3 F (36.8 C) (04/08 0728) Temp Source: Oral (04/08 0728) BP: 94/69 (04/08 0418) Pulse Rate: 100 (04/08 0600)  Labs: Recent Labs    01/14/21 1158 01/14/21 1538 01/14/21 2328 01/15/21 0128 01/15/21 0528 01/15/21 0712 01/15/21 0830 01/15/21 1434 01/16/21 0030 01/17/21 0615  HGB  --   --   --  10.6* 11.9*  --   --   --  10.6*  --   HCT  --   --   --  36.6* 35.0*  --   --   --  36.3*  --   PLT  --   --   --  240  --   --   --   --  264  --   APTT  --    < > 73* 81*  --  85*  --   --   --   --   LABPROT  --   --   --   --   --   --    < > 41.4* 31.2* 23.0*  INR  --   --   --   --   --   --    < > 4.5* 3.1* 2.1*  CREATININE  --    < >  --  7.36*  --   --   --  5.09* 4.33* 4.87*  TROPONINIHS 672*  --   --   --   --   --   --   --   --   --    < > = values in this interval not displayed.    Estimated Creatinine Clearance: 22.9 mL/min (A) (by C-G formula based on SCr of 4.87 mg/dL (H)).   Medical History: Past Medical History:  Diagnosis Date  . Allergic rhinitis   . Allergy   . Anemia   . Aortic insufficiency    SBE in 2016 >> pt declined AVR // a. Echo 7/16:  Mod LVH, EF 50-55%, mild to mod AI, severe LAE, PASP 57 mmHg (LV ID end diastolic 84.6 mm);  b. Echo 2/17:  Mod LVH, EF 50-55%, mod AI, MAC, mild MR, mod LAE, mild to mod TR, PASP 63 mmHg // Echo 2/21: normal EF, mild MR, mild AI  . Atrial fibrillation and flutter ([place])    chronic anticoag with  Warfarin // bradycardia noted during admit 10/21  . Bilateral carpal tunnel syndrome   . Bradycardia    brady/low BP event during admit in 10/21 >> poor candidate for pacer/EPS due to high risk of infection with ESRD; pacer not recommended by EP  . CAD (coronary artery disease)    s/p NSTEMI 10/21: cath>> pLAD 99 >> tx with DES; Lat RI 100 and OM1 100 - med Rx  . Cataract    removed both eyes   . Cholecystitis    dx during admx 10/21 >> HIDA neg // exam benign // high risk for surgery >> no surgical intervention recommended.   Marland Kitchen  Claustrophobia   . COPD (chronic obstructive pulmonary disease) ([place])   . Coronary artery disease   . Diabetes mellitus    Type II  . Diabetic peripheral neuropathy ([place])   . Diabetic retinopathy ([place])   . Discitis of lumbar region    resolved  . DVT (deep venous thrombosis) ([place]) 10/07/2015   LLE  . Dysrhythmia 07/2020   afib  . Endocarditis 11/28/14  . ESRD (end stage renal disease) on dialysis [facility])    s/p renal transplant in 2009, failed in 2016  . Gait disorder   . Gastroparesis   . GERD (gastroesophageal reflux disease)   . Gout   . Hearing difficulty   . Hemodialysis patient ([place])    tuesday, thursday, saturday   . HFrEF (HF with reduced EF)    Ischemic CM // Echo 10/21: EF 40-45, dist sept and apical AK, mild LVH, normal RVSF, mod MAC  . Hyperlipidemia   . Hypertension   . Incomplete bladder emptying   . Leg pain 02/05/11   with walking  . Morbid obesity ([place])   . Myocardial infarction ([place]) 07/2020  . Obstructive sleep apnea    not using CPAP- lost 100lbs  . Osteomyelitis ([place])   . Posttraumatic stress disorder   . Rotator cuff disorder   . Secondary hyperparathyroidism ([place])   . Sepsis ([place])    Postive blood cultures -   . Sleep apnea    no cpap  . Urethral stricture   . Vitamin D deficiency       Assessment: 55 yo male s/p cardiac arrest in ED this morning. Pharmacy consulted for heparin for ACS/chest pain also possible  suspicion of PE. Patient is on warfarin prior to admission for Afib and hx DVT. Was started on argatroban given anaphylaxis allergy to pork-derived products. ACS ruled out, and CCM not concerned for PE. Pharmacy asked to restart warfarin.  INR now therapeutic at 2.1 but down from 3.1 yesterday after restarting lower dose. CBC has been stable. Continues to eat well. Will restart PTA dose.  PTA regimen is 5 mg daily.   Goal of Therapy:  INR 2-3 Monitor platelets by anticoagulation protocol: Yes   Plan:   Warfarin 5 mg PO x1 today Monitor daily INR, CBC, s/sx bleeding  Richardine Service, PharmD, BCPS PGY2 Cardiology Pharmacy Resident Phone: [phone] 01/17/2021  10:39 AM  Please check [email or web address] for unit-specific pharmacy phone numbers.  Marland Kitchen

## 2021-01-18 LAB — PROTIME-INR
INR: 2 — ABNORMAL HIGH (ref 0.8–1.2)
Prothrombin Time: 21.8 seconds — ABNORMAL HIGH (ref 11.4–15.2)

## 2021-01-18 LAB — CBC
HCT: 37.1 % — ABNORMAL LOW (ref 39.0–52.0)
Hemoglobin: 11.3 g/dL — ABNORMAL LOW (ref 13.0–17.0)
MCH: 29.5 pg (ref 26.0–34.0)
MCHC: 30.5 g/dL (ref 30.0–36.0)
MCV: 96.9 fL (ref 80.0–100.0)
Platelets: 262 10*3/uL (ref 150–400)
RBC: 3.83 MIL/uL — ABNORMAL LOW (ref 4.22–5.81)
RDW: 18.2 % — ABNORMAL HIGH (ref 11.5–15.5)
WBC: 7.5 10*3/uL (ref 4.0–10.5)
nRBC: 0.3 % — ABNORMAL HIGH (ref 0.0–0.2)

## 2021-01-18 LAB — BASIC METABOLIC PANEL
Anion gap: 11 (ref 5–15)
BUN: 57 mg/dL — ABNORMAL HIGH (ref 6–20)
CO2: 25 mmol/L (ref 22–32)
Calcium: 8.4 mg/dL — ABNORMAL LOW (ref 8.9–10.3)
Chloride: 98 mmol/L (ref 98–111)
Creatinine, Ser: 6.85 mg/dL — ABNORMAL HIGH (ref 0.61–1.24)
GFR, Estimated: 9 mL/min — ABNORMAL LOW (ref >60)
Glucose, Bld: 251 mg/dL — ABNORMAL HIGH (ref 70–99)
Potassium: 4.5 mmol/L (ref 3.5–5.1)
Sodium: 134 mmol/L — ABNORMAL LOW (ref 135–145)

## 2021-01-18 LAB — GLUCOSE, CAPILLARY
Glucose-Capillary: 229 mg/dL — ABNORMAL HIGH (ref 70–99)
Glucose-Capillary: 128 mg/dL — ABNORMAL HIGH (ref 70–99)

## 2021-01-18 LAB — PHOSPHORUS: Phosphorus: 3.3 mg/dL (ref 2.5–4.6)

## 2021-01-18 LAB — MAGNESIUM: Magnesium: 2.4 mg/dL (ref 1.7–2.4)

## 2021-01-18 MED ORDER — CLONAZEPAM 0.5 MG PO TABS
ORAL_TABLET | ORAL | Status: AC
  Filled 2021-01-18: qty 1

## 2021-01-18 MED ORDER — DIPHENHYDRAMINE HCL 25 MG PO CAPS
ORAL_CAPSULE | ORAL | Status: AC
  Filled 2021-01-18: qty 1

## 2021-01-18 MED ORDER — HEPARIN SODIUM (PORCINE) 1000 UNIT/ML IJ SOLN
INTRAMUSCULAR | Status: AC
  Administered 2021-01-18: 1000 [IU]
  Filled 2021-01-18: qty 3

## 2021-01-18 MED ORDER — MIDODRINE HCL 5 MG PO TABS
ORAL_TABLET | ORAL | Status: AC
  Filled 2021-01-18: qty 2

## 2021-01-18 NOTE — Progress Notes (Signed)
   01/18/21 1209  Vitals  BP (!) 99/48  BP Location Right Arm  BP Method Automatic  Patient Position (if appropriate) Lying  Pulse Rate 94  Post-Hemodialysis Assessment  Rinseback Volume (mL) 250 mL  KECN 228 V  Dialyzer Clearance Lightly streaked  Duration of HD Treatment -hour(s) 4 hour(s)  Hemodialysis Intake (mL) 700 mL  UF Total -Machine (mL) 2711 mL  Net UF (mL) 2011 mL  Tolerated HD Treatment Yes  Post-Hemodialysis Comments tx complete-pt stable  Hemodialysis Catheter Right Internal jugular Double lumen Permanent (Tunneled)  Placement Date/Time: 11/08/20 1607   Placed prior to admission: No  Time Out: Correct patient;Correct site;Correct procedure  Maximum sterile barrier precautions: Hand hygiene;Cap;Mask;Sterile gown;Sterile gloves;Large sterile sheet  Site Prep: Chlorh...  Site Condition No complications  Blue Lumen Status Flushed;Capped (Central line);Heparin locked  Red Lumen Status Flushed;Capped (Central line);Heparin locked  Catheter fill solution Heparin 1000 units/ml (Per Dr. Jonnie Finner)  Catheter fill volume (Arterial) 1.6 cc  Catheter fill volume (Venous) 1.6  Dressing Type Occlusive  Dressing Status Clean;Dry;Intact  Antimicrobial disc in place? Yes  Drainage Description None  Dressing Change Due 01/23/21  Post treatment catheter status Capped and Clamped  HD tx complete-UF paused throughout tx for hypotension, UF=2 liters. Pt stable upon transfer.

## 2021-01-18 NOTE — Progress Notes (Signed)
[facility] for Warfarin Indication: atrial fibrillation and DVT history  Allergies  Allergen Reactions  . Ace Inhibitors Cough  . Hydrocodone Other (See Comments)    Hallucinations  . Pork-Derived Products Other (See Comments)    Entry determined to be clinically insignificant: OK TO GIVE HEPARIN (has tolerated well in the past)  Patient had allergic episode in 1989 after he ate pork at a fair. He developed fever and rash and went to the ED, but he did not have breathing issues. Received heparin in the past. Patient ok with removing pork allergy and listing as intolerance instead.    Patient Measurements: Height: _0  (177.8 cm) Weight: 134.6 kg (296 lb 11.8 oz) IBW/kg (Calculated) : 73  Vital Signs: Temp: 98.4 F (36.9 C) (04/09 0800) Temp Source: Oral (04/09 0800) BP: 97/45 (04/09 1100) Pulse Rate: 86 (04/09 0406)  Labs: Recent Labs    01/16/21 0030 01/17/21 0615 01/18/21 0221  HGB 10.6*  --  11.3*  HCT 36.3*  --  37.1*  PLT 264  --  262  LABPROT 31.2* 23.0* 21.8*  INR 3.1* 2.1* 2.0*  CREATININE 4.33* 4.87* 6.85*    Estimated Creatinine Clearance: 16.4 mL/min (A) (by C-G formula based on SCr of 6.85 mg/dL (H)).   Medical History: Past Medical History:  Diagnosis Date  . Allergic rhinitis   . Allergy   . Anemia   . Aortic insufficiency    SBE in 2016 >> pt declined AVR // a. Echo 7/16:  Mod LVH, EF 50-55%, mild to mod AI, severe LAE, PASP 57 mmHg (LV ID end diastolic 31.5 mm);  b. Echo 2/17:  Mod LVH, EF 50-55%, mod AI, MAC, mild MR, mod LAE, mild to mod TR, PASP 63 mmHg // Echo 2/21: normal EF, mild MR, mild AI  . Atrial fibrillation and flutter ([place])    chronic anticoag with Warfarin // bradycardia noted during admit 10/21  . Bilateral carpal tunnel syndrome   . Bradycardia    brady/low BP event during admit in 10/21 >> poor candidate for pacer/EPS due to high risk of infection with ESRD; pacer not recommended by EP   . CAD (coronary artery disease)    s/p NSTEMI 10/21: cath>> pLAD 99 >> tx with DES; Lat RI 100 and OM1 100 - med Rx  . Cataract    removed both eyes   . Cholecystitis    dx during admx 10/21 >> HIDA neg // exam benign // high risk for surgery >> no surgical intervention recommended.   . Claustrophobia   . COPD (chronic obstructive pulmonary disease) ([place])   . Coronary artery disease   . Diabetes mellitus    Type II  . Diabetic peripheral neuropathy ([place])   . Diabetic retinopathy ([place])   . Discitis of lumbar region    resolved  . DVT (deep venous thrombosis) ([place]) 10/07/2015   LLE  . Dysrhythmia 07/2020   afib  . Endocarditis 11/28/14  . ESRD (end stage renal disease) on dialysis [facility])    s/p renal transplant in 2009, failed in 2016  . Gait disorder   . Gastroparesis   . GERD (gastroesophageal reflux disease)   . Gout   . Hearing difficulty   . Hemodialysis patient ([place])    tuesday, thursday, saturday   . HFrEF (HF with reduced EF)    Ischemic CM // Echo 10/21: EF 40-45, dist sept and apical AK, mild LVH, normal RVSF, mod MAC  . Hyperlipidemia   .  Hypertension   . Incomplete bladder emptying   . Leg pain 02/05/11   with walking  . Morbid obesity ([place])   . Myocardial infarction ([place]) 07/2020  . Obstructive sleep apnea    not using CPAP- lost 100lbs  . Osteomyelitis ([place])   . Posttraumatic stress disorder   . Rotator cuff disorder   . Secondary hyperparathyroidism ([place])   . Sepsis ([place])    Postive blood cultures -   . Sleep apnea    no cpap  . Urethral stricture   . Vitamin D deficiency     Assessment: 55 yo male s/p cardiac arrest in ED x 60mn with ROSC. Pharmacy consulted for heparin for ACS/chest pain and also possible suspicion of PE. Patient is on warfarin prior to admission for Afib and hx DVT. Was started on argatroban given history of allergy to pork-derived products. ACS ruled out, and CCM not concerned for PE. Pharmacy asked to restart warfarin 4/7.  INR  remains therapeutic at 2.0 - stable from yesterday. CBC has been stable. Continues to eat well. Will continue PTA dose. No bleed issues reported.  PTA regimen is 5 mg daily.   Goal of Therapy:  INR 2-3 Monitor platelets by anticoagulation protocol: Yes   Plan:   Patient to discharge today. Will not order dose for inpatient 4/9. Warfarin 5 mg PO daily to continue on discharge per MD Monitor daily INR, CBC, s/sx bleeding   HArturo Morton PharmD, BCPS Please check AMION for all MDaltoncontact numbers Clinical Pharmacist 01/18/2021 11:28 AM   .

## 2021-01-18 NOTE — Discharge Summary (Signed)
Physician Discharge Summary  [name] [name] GLO:[id number] DOB: 12/10/1965 DOA: 01/13/2021  PCP: Hoyt Koch, MD  Admit date: 01/13/2021   Discharge date: 01/18/2021  Admitted From:  Home.  Disposition:   Home.  Recommendations for Outpatient Follow-up:  1. Follow up with PCP in 1-2 weeks. 2. Please obtain BMP/CBC in one week. 3. Advised to follow-up with cardiology appointment as scheduled, 4. Advised to continue hemodialysis as per schedule.   Home Health: None. Equipment/Devices: None  Discharge Condition: Stable CODE STATUS:Full code Diet recommendation: Heart Healthy   Brief Summary / Hospital Course: This 55 years old male with PMH significant for CAD post MI, ESRD with multiple failed AV grafts currently getting hemodialysis through right IJ tunneled catheter, poorly controlled diabetes, morbid obesity who presents in the emergency department with suspected reaction to shellfish.  Patient was found to have anaphylactic reaction and was given the steroids, H2 blockers which he responded well. He was due for hemodialysis and was being ready for transport out of the ER at which he coded, He has received CPR for less than 3 minutes,  did not require intubation or cardioversion.  Patient was alert and oriented post arrest. He was found to be hyperkalemic with K + 7.4, Nephrology was consulted. Patient was started on CRRT through this tunnelled catheter,  Patient was admitted in ICU.   Patient remained in ICU, Potassium normalized.  He is hemodynamically stable . Patient was moved out of ICU on 01/17/21.  Patient had hemodialysis on 01/18/21.  Patient feels much better,  alert and oriented and wants to be discharged.  Patient is cleared from nephrology.  Patient is being discharged home.  Significant Hospital events.  01/14/2021 Cardiac arrest due to HyperK, CPR, ROSC, A&O, Admit to ICU  4/5 CRRT initiated> stopped  4/5 Tunneled dialysis cath accessed >  4/5 BLLE Dopplers>  Neg  4/8: PCCM pick up.  4/9 : Continue HD as per schedule.  4/10: DC home  He was managed for below problems.  Discharge Diagnoses:  Active Problems:   Cardiac arrest [facility])  Status post cardiac arrest with ROSC. Patient has extensive coronary artery disease with acute MI in the past. Patient while being transported to the hemodialysis,  had a cardiac arrest requiring CPR for less than 2 minutes. Likely 2nd to Hyperkalemia, did receive chest compressions, trop trending down. Cardiology deferred ischemic workup. Appreciate cardiology assistance, acknowledge sign off. Continue clopidogrel, atorvastatin, and warfarin Will need follow up with outpatient cards with [facility] in June.   CAD,MI, Afib, PE, DVT and multiple vascular interventions for development of AV fistulas. -Transitioned from [place] to Warfarin 01/16/21. -Appreciate pharmacy assistance with management  End-stage renal disease with electrolyte abnormalities: Electrolyte disturbances:Hyperkalemia, Hyponatrema CRRT started 6 am on 01/16/21. Appreciate Neph assistance, will follow for recommendations Continue to monitor renal function panel Transitioned to HD as per schedule per Neph.  Diabetes mellitus:  -Continue to monitor BG ACHS -Transitioned from insulin GTT to SSI with long acting insulin. - Continue Lantus 25 units twice daily and NovoLog 6 units 3 times daily with meals  Chronic Hypotension:  Asymptomatic. BP improved with home midodrine. -Continue midodrine  History obstructive sleep apnea -Cont Nocturnal CPAP. -O2 off, goal SPO2 92-98%  Acute on Chronic Normocytic Anemia Likely chronic, no signs of bleeding, HGB 10.6 to 10.6 Continue to monitor for signs of blood loss, will repeat CBC PRN if signs of hemodynamic instability Transfuse HGB < 7  Allergy Reaction:  Received steroids and H2 blocker  Continue to monitor  Anxiety: Continue Klonopin 0.5mg  BID  Chronic R Foot  Wound: Appears to be healing Continue to monitor.  Discharge Instructions  Discharge Instructions    Call MD for:  difficulty breathing, headache or visual disturbances   Complete by: As directed    Call MD for:  persistant dizziness or light-headedness   Complete by: As directed    Call MD for:  severe uncontrolled pain   Complete by: As directed    Call MD for:  temperature >100.4   Complete by: As directed    Diet - low sodium heart healthy   Complete by: As directed    Diet Carb Modified   Complete by: As directed    Discharge instructions   Complete by: As directed    Advised to follow-up with primary care physician in 1 week. Advised to follow-up with cardiology appointment is scheduled, Advised to continue hemodialysis as per schedule.   Discharge wound care:   Complete by: As directed    Follow up PCP.   Increase activity slowly   Complete by: As directed      Allergies as of 01/18/2021      Reactions   Ace Inhibitors Cough   Hydrocodone Other (See Comments)   Hallucinations   Pork-derived Products Other (See Comments)   Entry determined to be clinically insignificant: OK TO GIVE HEPARIN (has tolerated well in the past) Patient had allergic episode in 1989 after he ate pork at a fair. He developed fever and rash and went to the ED, but he did not have breathing issues. Received heparin in the past. Patient ok with removing pork allergy and listing as intolerance instead.      Medication List    STOP taking these medications   acetaminophen 325 MG tablet Commonly known as: TYLENOL   omeprazole 20 MG capsule Commonly known as: PRILOSEC     TAKE these medications   Accu-Chek FastClix Lancets Misc USE TO CHECK BLOOD SUGARS 4 TIMES A DAY.   Accu-Chek Guide test strip Generic drug: glucose blood USE AS INSTRUCTED TO CHECK BLOOD SUGAR 3 TIMES DAILY AS NEEDED   allopurinol 100 MG tablet Commonly known as: ZYLOPRIM Take 1 tablet (100 mg total) by mouth 3  (three) times a week. After dialysis What changed: when to take this   atorvastatin 80 MG tablet Commonly known as: LIPITOR Take 1 tablet (80 mg total) by mouth daily.   Auryxia 1 GM 210 MG(Fe) tablet Generic drug: ferric citrate Take 630 mg by mouth 3 (three) times daily with meals. Takes 3 tablets with meal and 2 tablet with snacks   busPIRone 5 MG tablet Commonly known as: BUSPAR TAKE 1 TABLET TWICE DAILY AS NEEDED What changed: See the new instructions.   clonazePAM 0.5 MG tablet Commonly known as: KLONOPIN Use 1-2 tablets prior to dialysis 3 times weekly. What changed:   how much to take  how to take this  when to take this  additional instructions   clopidogrel 75 MG tablet Commonly known as: PLAVIX Take 1 tablet (75 mg total) by mouth daily with breakfast.   diphenhydrAMINE 25 MG tablet Commonly known as: BENADRYL Take 1 tablet (25 mg total) by mouth every 8 (eight) hours as needed for itching.   HumuLIN R U-500 KwikPen 500 UNIT/ML kwikpen Generic drug: insulin regular human CONCENTRATED Inject 50-70 Units into the skin See admin instructions. Inject 50 units ad breakfast , 70 units at lunch, and 60 units at dinner  Insulin Pen Needle 32G X 8 MM Misc Use with insulin x5 times a day What changed:   how much to take  how to take this  when to take this   Droplet Pen Needles 32G X 4 MM Misc Generic drug: Insulin Pen Needle USE&nbsp;&nbsp;TO INJECT FIVE TIMES DAILY AS DIRECTED What changed: Another medication with the same name was changed. Make sure you understand how and when to take each.   midodrine 10 MG tablet Commonly known as: PROAMATINE Take 10 mg by mouth 3 (three) times daily.   MIRCERA IJ Mircera   pantoprazole 40 MG tablet Commonly known as: PROTONIX Take 1 tablet (40 mg total) by mouth daily.   predniSONE 50 MG tablet Commonly known as: DELTASONE 1 tablet PO QD X4 days   sevelamer carbonate 800 MG tablet Commonly known as:  RENVELA Take 2,400-3,200 mg by mouth in the morning, at noon, in the evening, and at bedtime. Takes 4 tablets with meals and 2 tablets with snacks   Toujeo Max SoloStar 300 UNIT/ML Solostar Pen Generic drug: insulin glargine (2 Unit Dial) INJECT 80 UNITS UNDER THE SKIN IN THE MORNING AND 80 UNITS IN THE EVENING What changed:   how much to take  how to take this  when to take this  additional instructions   Veltassa 8.4 g packet Generic drug: patiromer Take 8.4 g by mouth daily.   warfarin 5 MG tablet Commonly known as: COUMADIN Take as directed. If you are unsure how to take this medication, talk to your nurse or doctor. Original instructions: Take 1 tablet (5 mg total) by mouth See admin instructions. TAKE 1 TABLET EVERY DAY OR AS DIRECTED BY COUMADIN CLINIC. What changed:   how much to take  when to take this  additional instructions            Discharge Care Instructions  (From admission, onward)         Start     Ordered   01/18/21 0000  Discharge wound care:       Comments: Follow up PCP.   01/18/21 1114          Follow-up Information    Hoyt Koch, MD Follow up in 1 week(s).   Specialty: Internal Medicine Contact information: [place] [address] [phone]        Burnell Blanks, MD .   Specialty: Cardiology Contact information: [address] [place] [address] [phone]              Allergies  Allergen Reactions  . Ace Inhibitors Cough  . Hydrocodone Other (See Comments)    Hallucinations  . Pork-Derived Products Other (See Comments)    Entry determined to be clinically insignificant: OK TO GIVE HEPARIN (has tolerated well in the past)  Patient had allergic episode in 1989 after he ate pork at a fair. He developed fever and rash and went to the ED, but he did not have breathing issues. Received heparin in the past. Patient ok with removing pork allergy and listing as  intolerance instead.    Consultations:  Nephrology  Cardiology  PCCM   Procedures/Studies: DG CHEST PORT 1 VIEW  Result Date: 01/16/2021 CLINICAL DATA:  Neuritis. EXAM: PORTABLE CHEST 1 VIEW COMPARISON:  January 14, 2021. FINDINGS: Stable cardiomegaly with mild central pulmonary vascular congestion. Right internal jugular dialysis catheter is unchanged. No pneumothorax or significant pleural effusion is noted. Mildly increased interstitial densities are noted in  both lungs which may represent edema. Bony thorax is unremarkable. IMPRESSION: Stable cardiomegaly with central pulmonary vascular congestion. Probable mild bilateral pulmonary edema is noted as well. Electronically Signed   By: Marijo Conception M.D.   On: 01/16/2021 08:11   DG Chest Port 1 View  Result Date: 01/14/2021 CLINICAL DATA:  Shortness of breath EXAM: PORTABLE CHEST 1 VIEW COMPARISON:  November 08, 2020 FINDINGS: Central catheter tip is in the superior vena cava near the cavoatrial junction. There is mild left base atelectasis. No edema or airspace opacity. There is cardiomegaly with pulmonary vascularity within normal limits. There is aortic atherosclerosis. No adenopathy no bone lesions. IMPRESSION: Central catheter as described without pneumothorax. Cardiomegaly noted. Mild left base atelectasis. Lungs otherwise clear. Aortic Atherosclerosis (ICD10-I70.0). Electronically Signed   By: Lowella Grip III M.D.   On: 01/14/2021 09:16   VAS [place] LOWER EXTREMITY VENOUS (DVT)  Result Date: 01/15/2021  Lower Venous DVT Study Indications: Pain.  Limitations: Body habitus, poor ultrasound/tissue interface and patient movement. Comparison Study: No previous exams Performing Technologist: Rogelia Rohrer  Examination Guidelines: A complete evaluation includes B-mode imaging, spectral Doppler, color Doppler, and power Doppler as needed of all accessible portions of each vessel. Bilateral testing is considered an integral part of a complete  examination. Limited examinations for reoccurring indications may be performed as noted. The reflux portion of the exam is performed with the patient in reverse Trendelenburg.  +---------+---------------+---------+-----------+----------+--------------+ RIGHT    CompressibilityPhasicitySpontaneityPropertiesThrombus Aging +---------+---------------+---------+-----------+----------+--------------+ CFV      Full                                                        +---------+---------------+---------+-----------+----------+--------------+ SFJ      Full                                                        +---------+---------------+---------+-----------+----------+--------------+ FV Prox  Full                                                        +---------+---------------+---------+-----------+----------+--------------+ FV Mid   Full                                                        +---------+---------------+---------+-----------+----------+--------------+ FV DistalFull                                                        +---------+---------------+---------+-----------+----------+--------------+ PFV      Full                                                        +---------+---------------+---------+-----------+----------+--------------+  POP      Full                                                        +---------+---------------+---------+-----------+----------+--------------+   +---------+---------------+---------+-----------+----------+-------------------+ LEFT     CompressibilityPhasicitySpontaneityPropertiesThrombus Aging      +---------+---------------+---------+-----------+----------+-------------------+ CFV      Full           Yes      Yes                                      +---------+---------------+---------+-----------+----------+-------------------+ SFJ      Full                                                              +---------+---------------+---------+-----------+----------+-------------------+ FV Prox  Full           Yes      Yes                                      +---------+---------------+---------+-----------+----------+-------------------+ FV Mid   Full           Yes      Yes                                      +---------+---------------+---------+-----------+----------+-------------------+ FV DistalFull           Yes      Yes                                      +---------+---------------+---------+-----------+----------+-------------------+ PFV      Full                                                             +---------+---------------+---------+-----------+----------+-------------------+ POP      Full           Yes      Yes                                      +---------+---------------+---------+-----------+----------+-------------------+ PTV      Full                                         Not well visualized +---------+---------------+---------+-----------+----------+-------------------+ PERO     Full  Not well visualized +---------+---------------+---------+-----------+----------+-------------------+ intraosseous IV in LLE calf. Vessels difficult to visualize.    Summary: BILATERAL: - No evidence of deep vein thrombosis seen in the lower extremities, bilaterally. - No evidence of superficial venous thrombosis in the lower extremities, bilaterally. -No evidence of popliteal cyst, bilaterally.   *See table(s) above for measurements and observations. Electronically signed by Curt Jews MD on 01/15/2021 at 6:02:19 AM.    Final     CRRT.   Subjective: Patient was seen in hemodialysis suite,  Patient was sitting on the bed,  getting hemodialysis. Patient is alert and oriented,  feels better, Patient wants to be discharged home.  Patient is cleared from nephrology to discharge.  Discharge Exam: Vitals:    01/18/21 1030 01/18/21 1100  BP: (!) 118/56 (!) 97/45  Pulse:    Resp:    Temp:    SpO2:     Vitals:   01/18/21 0930 01/18/21 1000 01/18/21 1030 01/18/21 1100  BP: (!) 92/56 (!) 99/58 (!) 118/56 (!) 97/45  Pulse:      Resp:  18    Temp:      TempSrc:      SpO2:      Weight:      Height:        General: Pt is alert, awake, not in acute distress Cardiovascular: RRR, S1/S2 +, no rubs, no gallops Respiratory: CTA bilaterally, no wheezing, no rhonchi Abdominal: Soft, NT, ND, bowel sounds + Extremities: no edema, no cyanosis    The results of significant diagnostics from this hospitalization (including imaging, microbiology, ancillary and laboratory) are listed below for reference.     Microbiology: Recent Results (from the past 240 hour(s))  Resp Panel by RT-PCR (Flu A&B, Covid) Nasopharyngeal Swab     Status: None   Collection Time: 01/14/21  9:17 AM   Specimen: Nasopharyngeal Swab; Nasopharyngeal(NP) swabs in vial transport medium  Result Value Ref Range Status   SARS Coronavirus 2 by RT PCR NEGATIVE NEGATIVE Final    Comment: (NOTE) SARS-CoV-2 target nucleic acids are NOT DETECTED.  The SARS-CoV-2 RNA is generally detectable in upper respiratory specimens during the acute phase of infection. The lowest concentration of SARS-CoV-2 viral copies this assay can detect is 138 copies/mL. A negative result does not preclude SARS-Cov-2 infection and should not be used as the sole basis for treatment or other patient management decisions. A negative result may occur with  improper specimen collection/handling, submission of specimen other than nasopharyngeal swab, presence of viral mutation(s) within the areas targeted by this assay, and inadequate number of viral copies(<138 copies/mL). A negative result must be combined with clinical observations, patient history, and epidemiological information. The expected result is Negative.  Fact Sheet for Patients:   EntrepreneurPulse.com.au  Fact Sheet for Healthcare Providers:  IncredibleEmployment.be  This test is no t yet approved or cleared by the [place] FDA and  has been authorized for detection and/or diagnosis of SARS-CoV-2 by FDA under an Emergency Use Authorization (EUA). This EUA will remain  in effect (meaning this test can be used) for the duration of the COVID-19 declaration under Section 564(b)(1) of the Act, 21 U.S.C.section 360bbb-3(b)(1), unless the authorization is terminated  or revoked sooner.       Influenza A by PCR NEGATIVE NEGATIVE Final   Influenza B by PCR NEGATIVE NEGATIVE Final    Comment: (NOTE) The Xpert Xpress SARS-CoV-2/FLU/RSV plus assay is intended as an aid in the diagnosis of influenza from Nasopharyngeal swab specimens and should  not be used as a sole basis for treatment. Nasal washings and aspirates are unacceptable for Xpert Xpress SARS-CoV-2/FLU/RSV testing.  Fact Sheet for Patients: EntrepreneurPulse.com.au  Fact Sheet for Healthcare Providers: IncredibleEmployment.be  This test is not yet approved or cleared by the [place] FDA and has been authorized for detection and/or diagnosis of SARS-CoV-2 by FDA under an Emergency Use Authorization (EUA). This EUA will remain in effect (meaning this test can be used) for the duration of the COVID-19 declaration under Section 564(b)(1) of the Act, 21 U.S.C. section 360bbb-3(b)(1), unless the authorization is terminated or revoked.  Performed at [facility], [place] [address], [place], [place] [address]   MRSA PCR Screening     Status: None   Collection Time: 01/15/21  9:44 PM   Specimen: Nasal Mucosa; Nasopharyngeal  Result Value Ref Range Status   MRSA by PCR NEGATIVE NEGATIVE Final    Comment:        The GeneXpert MRSA Assay (FDA approved for NASAL specimens only), is one component of a comprehensive MRSA  colonization surveillance program. It is not intended to diagnose MRSA infection nor to guide or monitor treatment for MRSA infections. Performed at [facility], [place] [address], [place], [place] [address]      Labs: BNP (last 3 results) No results for input(s): BNP in the last 8760 hours. Basic Metabolic Panel: Recent Labs  Lab 01/14/21 0856 01/14/21 1052 01/15/21 0128 01/15/21 0528 01/15/21 1434 01/16/21 0030 01/17/21 0615 01/18/21 0221  NA 135   < > 135 135 133* 131* 134* 134*  K 6.9*   < > 5.7* 5.2* 4.5 3.7 4.7 4.5  CL 99   < > 100  --  100 99 99 98  CO2 21*   < > 24  --  23 25 22 25   GLUCOSE 387*   < > 157*  --  229* 198* 216* 251*  BUN 61*   < > 54*  --  45* 40* 35* 57*  CREATININE 9.49*   < > 7.36*  --  5.09* 4.33* 4.87* 6.85*  CALCIUM 9.4   < > 8.9  --  8.2* 8.1* 8.4* 8.4*  MG 2.3  --  2.6*  --   --  2.5* 2.2 2.4  PHOS  --    < > 6.3*  --  4.9* 4.0 3.7 3.3   < > = values in this interval not displayed.   Liver Function Tests: Recent Labs  Lab 01/13/21 2311 01/14/21 0856 01/14/21 1709 01/15/21 0128 01/15/21 1434 01/16/21 0030 01/17/21 0615  AST 26 25  --   --   --   --   --   ALT 13 18  --   --   --   --   --   ALKPHOS 60 63  --   --   --   --   --   BILITOT 1.3* 1.2  --   --   --   --   --   PROT 7.1 6.9  --   --   --   --   --   ALBUMIN 3.1* 2.9* 3.2* 3.1* 2.9* 2.9* 3.1*   No results for input(s): LIPASE, AMYLASE in the last 168 hours. No results for input(s): AMMONIA in the last 168 hours. CBC: Recent Labs  Lab 01/13/21 2311 01/14/21 0856 01/14/21 1052 01/15/21 0128 01/15/21 0528 01/16/21 0030 01/18/21 0221  WBC 6.0 5.4  --  7.5  --  8.3  7.5  NEUTROABS 4.0 4.7  --   --   --   --   --   HGB 11.6* 10.8* 11.9* 10.6* 11.9* 10.6* 11.3*  HCT 39.7 37.8* 35.0* 36.6* 35.0* 36.3* 37.1*  MCV 98.5 101.6*  --  98.7  --  98.9 96.9  PLT 281 250  --  240  --  264 262   Cardiac Enzymes: No results for input(s): CKTOTAL, CKMB, CKMBINDEX,  TROPONINI in the last 168 hours. BNP: Invalid input(s): POCBNP CBG: Recent Labs  Lab 01/17/21 0643 01/17/21 1116 01/17/21 1516 01/17/21 2118 01/18/21 0732  GLUCAP 229* 224* 143* 282* 229*   D-Dimer No results for input(s): DDIMER in the last 72 hours. Hgb A1c No results for input(s): HGBA1C in the last 72 hours. Lipid Profile No results for input(s): CHOL, HDL, LDLCALC, TRIG, CHOLHDL, LDLDIRECT in the last 72 hours. Thyroid function studies No results for input(s): TSH, T4TOTAL, T3FREE, THYROIDAB in the last 72 hours.  Invalid input(s): FREET3 Anemia work up No results for input(s): VITAMINB12, FOLATE, FERRITIN, TIBC, IRON, RETICCTPCT in the last 72 hours. Urinalysis    Component Value Date/Time   COLORURINE YELLOW 10/13/2015 1455   APPEARANCEUR CLOUDY (A) 10/13/2015 1455   LABSPEC 1.021 10/13/2015 1455   PHURINE 5.0 10/13/2015 1455   GLUCOSEU 250 (A) 10/13/2015 1455   HGBUR MODERATE (A) 10/13/2015 1455   BILIRUBINUR NEGATIVE 10/13/2015 1455   KETONESUR NEGATIVE 10/13/2015 1455   PROTEINUR >300 (A) 10/13/2015 1455   UROBILINOGEN 0.2 10/24/2014 1223   NITRITE NEGATIVE 10/13/2015 1455   LEUKOCYTESUR SMALL (A) 10/13/2015 1455   Sepsis Labs Invalid input(s): PROCALCITONIN,  WBC,  LACTICIDVEN Microbiology Recent Results (from the past 240 hour(s))  Resp Panel by RT-PCR (Flu A&B, Covid) Nasopharyngeal Swab     Status: None   Collection Time: 01/14/21  9:17 AM   Specimen: Nasopharyngeal Swab; Nasopharyngeal(NP) swabs in vial transport medium  Result Value Ref Range Status   SARS Coronavirus 2 by RT PCR NEGATIVE NEGATIVE Final    Comment: (NOTE) SARS-CoV-2 target nucleic acids are NOT DETECTED.  The SARS-CoV-2 RNA is generally detectable in upper respiratory specimens during the acute phase of infection. The lowest concentration of SARS-CoV-2 viral copies this assay can detect is 138 copies/mL. A negative result does not preclude SARS-Cov-2 infection and should not be  used as the sole basis for treatment or other patient management decisions. A negative result may occur with  improper specimen collection/handling, submission of specimen other than nasopharyngeal swab, presence of viral mutation(s) within the areas targeted by this assay, and inadequate number of viral copies(<138 copies/mL). A negative result must be combined with clinical observations, patient history, and epidemiological information. The expected result is Negative.  Fact Sheet for Patients:  EntrepreneurPulse.com.au  Fact Sheet for Healthcare Providers:  IncredibleEmployment.be  This test is no t yet approved or cleared by the [place] FDA and  has been authorized for detection and/or diagnosis of SARS-CoV-2 by FDA under an Emergency Use Authorization (EUA). This EUA will remain  in effect (meaning this test can be used) for the duration of the COVID-19 declaration under Section 564(b)(1) of the Act, 21 U.S.C.section 360bbb-3(b)(1), unless the authorization is terminated  or revoked sooner.       Influenza A by PCR NEGATIVE NEGATIVE Final   Influenza B by PCR NEGATIVE NEGATIVE Final    Comment: (NOTE) The Xpert Xpress SARS-CoV-2/FLU/RSV plus assay is intended as an aid in the diagnosis of influenza from Nasopharyngeal swab specimens and  should not be used as a sole basis for treatment. Nasal washings and aspirates are unacceptable for Xpert Xpress SARS-CoV-2/FLU/RSV testing.  Fact Sheet for Patients: EntrepreneurPulse.com.au  Fact Sheet for Healthcare Providers: IncredibleEmployment.be  This test is not yet approved or cleared by the [place] FDA and has been authorized for detection and/or diagnosis of SARS-CoV-2 by FDA under an Emergency Use Authorization (EUA). This EUA will remain in effect (meaning this test can be used) for the duration of the COVID-19 declaration under Section  564(b)(1) of the Act, 21 U.S.C. section 360bbb-3(b)(1), unless the authorization is terminated or revoked.  Performed at [facility], [place] [address] [place], [place] [address]   MRSA PCR Screening     Status: None   Collection Time: 01/15/21  9:44 PM   Specimen: Nasal Mucosa; Nasopharyngeal  Result Value Ref Range Status   MRSA by PCR NEGATIVE NEGATIVE Final    Comment:        The GeneXpert MRSA Assay (FDA approved for NASAL specimens only), is one component of a comprehensive MRSA colonization surveillance program. It is not intended to diagnose MRSA infection nor to guide or monitor treatment for MRSA infections. Performed at [facility], [place] [address] [place], [place] [address]      Time coordinating discharge: Over 30 minutes  SIGNED:   Shawna Clamp, MD  [facility] 01/18/2021, 11:14 AM Pager   If 7PM-7AM, please contact night-coverage [email or web address]

## 2021-01-18 NOTE — Progress Notes (Signed)
[name]  Subjective: seen on HD, 3.5 L goal. Doing fine on HD, BP's 90's- 100's  Vitals:   01/18/21 0830 01/18/21 0900 01/18/21 0930 01/18/21 1000  BP: (!) 108/47 103/83 (!) 92/56 (!) 99/58  Pulse:      Resp:    18  Temp:      TempSrc:      SpO2:      Weight:      Height:        Exam:  alert, nad   no jvd  Chest clear bilat, no rales  Cor reg no RG  Abd soft ntnd no ascites,  Ext no LE edema   Alert, NF, ox3  RIJ TDC     OP HD: TTS NW   4.5h  F250  130.5kg  2/2.5 bath  TDC RIJ  Hep none  - Mircera 62mcg IV q 2 weeks (last given 3/10)  - Calcitriol 1.72mcg PO q HD  Assessment/Plan: 1.  Cardiac Arrest: while leaving from the ED after Rx for possible anaphylaxis. Less than 49min CPR without Epi or shocks before ROSC.  2.  Hyperkalemia: K+ 7 post-arrest, sp CRRT 4/6 - 4/7.  Resolved. 3.  Anaphylactic reaction: itching and skin rash after eating shellfish, rx'd in ED with Decadron and benadryl. Pt should see allergist as outpatient if possible.   4.  ESRD: Usual TTS schedule. SP CRRT overnight 4/6- 4/7. Regular HD yesterday 4/7 w/o incident. HD today in progress.   5.  Vol overload - w/ pulm edema by CXR 4/7, resolved w/ HD. Will be at dry wt after HD this am.  6.  Chron hypotension: on midodrine tid, continue 7.  Question of heparin allergy: pharm was able to remove his heparin allergy, appreciate assistance. Now has just pork intolerance.  8.  Anemia: Hgb >10, no ESA needed for now. 9.  Metabolic bone disease: CorrCa slightly high - hold off on calcitriol for now. Resumed home renvela 4 ac. Resume auryxia on dc.  10.  T2DM 11.   R foot wound: Appears to be healing 12.  Dispo - ok for dc after HD   [name] [name] 01/18/2021, 10:36 AM   Recent Labs  Lab 01/16/21 0030 01/17/21 0615 01/18/21 0221  K 3.7 4.7 4.5  BUN 40* 35* 57*  CREATININE 4.33* 4.87* 6.85*  CALCIUM 8.1* 8.4* 8.4*  PHOS 4.0 3.7 3.3  HGB 10.6*  --  11.3*   Inpatient  medications: . heparin sodium (porcine)      . atorvastatin  80 mg Oral Daily  . Chlorhexidine Gluconate Cloth  6 each Topical Daily  . clonazePAM  0.5 mg Oral BID  . clopidogrel  75 mg Oral Q breakfast  . insulin aspart  0-15 Units Subcutaneous TID WC  . insulin aspart  0-5 Units Subcutaneous QHS  . insulin aspart  6 Units Subcutaneous TID WC  . insulin detemir  25 Units Subcutaneous BID  . mouth rinse  15 mL Mouth Rinse BID  . midodrine  10 mg Oral TID WC  . pantoprazole (PROTONIX) IV  40 mg Intravenous QHS  . sevelamer carbonate  3,200 mg Oral TID WC  . sodium chloride flush  3 mL Intravenous Q12H  . Warfarin - Pharmacist Dosing Inpatient   Does not apply q1600   . sodium chloride    . sodium chloride Stopped (01/16/21 1155)  . insulin Stopped (01/16/21 1154)   sodium chloride, acetaminophen, albuterol, dextrose, diphenhydrAMINE, diphenhydrAMINE-zinc acetate, docusate sodium, polyethylene  glycol, sodium chloride flush

## 2021-01-18 NOTE — TOC Transition Note (Addendum)
Transition of Care [facility]) - CM/SW Discharge Note   Patient Details  Name: [name] [name] MRN: [id number] Date of Birth: 10-05-1966  Transition of [facility]) CM/SW Contact:  Konrad Penta, RN Phone Number: [phone] 01/18/2021, 2:01 PM   Clinical Narrative:  Spoke with patient who declines home health but is agreeable to outpatient therapy for PT. Outpatient rehab referral placed.  Mr. Fandino states he will need DME rollator and bedside commode. Referral placed with [facility] and will be delivered to room prior to transition home.   ADDENDUM 1415: Spoke with patient and he  is taking taxi home. Will need equipment delivered to home address in instead [address], [place], [place] [address].   [place] to make aware home delivery is needed instead of bedside delivery of equipment.    Final next level of care: OP Rehab Barriers to Discharge: No Barriers Identified   Patient Goals and CMS Choice Patient states their goals for this hospitalization and ongoing recovery are:: return home CMS Medicare.gov Compare Post Acute Care list provided to:: Patient Choice offered to / list presented to : Patient  Discharge Placement                       Discharge Plan and Services In-house Referral: NA Discharge Planning Services: CM Consult Post Acute Care Choice: NA          DME Arranged: 3-N-1,Walker rolling with seat DME Agency: [employer] Date DME Agency Contacted: 01/18/21 Time DME Agency Contacted: 1 Representative spoke with at DME Agency: Pura Spice HH Arranged: Refused [place] (Pt states his apartment is small and that he would rather go outpatient for services.) [place] Agency: NA        Social Determinants of Health (SDOH) Interventions     Readmission Risk Interventions Readmission Risk Prevention Plan 01/17/2021 08/04/2020  Transportation Screening Complete -  PCP or Specialist Appt within 3-5 Days - Complete  HRI or [place] - Complete  Social Work Consult for [place] Planning/Counseling - Complete  Palliative Care Screening - Complete  Medication Review Press photographer) Complete Complete  PCP or Specialist appointment within 3-5 days of discharge Complete -  [place] or Home Care Consult Complete -  SW Recovery Care/Counseling Consult Complete -  Palliative Care Screening Not Applicable -  [place] Not Applicable -  Some recent data might be hidden    Marthenia Rolling, MSN, RN,BSN Inpatient [facility] Case Manager [phone]

## 2021-01-18 NOTE — Procedures (Signed)
   I was present at this dialysis session, have reviewed the session itself and made  appropriate changes Kelly Splinter MD [place] pager [phone]   01/18/2021, 10:39 AM

## 2021-01-18 NOTE — Social Work (Signed)
CSW was alerted that pt needs transportation. CSW asked for pt's address to be verified due to it being a P.O Box. Address was verified and taxi voucher provided.

## 2021-01-18 NOTE — Discharge Instructions (Signed)
Allergies, Adult An allergy is a condition in which the body's defense system (immune system) comes in contact with an allergen and reacts to it. An allergen is anything that causes an allergic reaction. Allergens cause the immune system to make proteins for fighting infections (antibodies). These antibodies cause cells to release chemicals called histamines that set off the symptoms of an allergic reaction. Allergies often affect the nasal passages (allergic rhinitis), eyes (allergic conjunctivitis), skin (atopic dermatitis), and stomach. Allergies can be mild, moderate, or severe. They cannot spread from person to person. Allergies can develop at any age and may be outgrown. What are the causes? This condition is caused by allergens. Common allergens include:  Outdoor allergens, such as pollen, car fumes, and mold.  Indoor allergens, such as dust, smoke, mold, and pet dander.  Other allergens, such as foods, medicines, scents, insect bites or stings, and other skin irritants. What increases the risk? You are more likely to develop this condition if you have:  Family members with allergies.  Family members who have any condition that may be caused by allergens, such as asthma. This may make you more likely to have other allergies. What are the signs or symptoms? Symptoms of this condition depend on the severity of the allergy. Mild to moderate symptoms  Runny nose, stuffy nose (nasal congestion), or sneezing.  Itchy mouth, ears, or throat.  A feeling of mucus dripping down the back of your throat (postnasal drip).  Sore throat.  Itchy, red, watery, or puffy eyes.  Skin rash, or itchy, red, swollen areas of skin (hives).  Stomach cramps or bloating. Severe symptoms Severe allergies to food, medicine, or insect bites may cause anaphylaxis, which can be life-threatening. Symptoms include:  A red (flushed) face.  Wheezing or coughing.  Swollen lips, tongue, or  mouth.  Tight or swollen throat.  Chest pain or tightness, or rapid heartbeat.  Trouble breathing or shortness of breath.  Pain in the abdomen, vomiting, or diarrhea.  Dizziness or fainting. How is this diagnosed? This condition is diagnosed based on your symptoms, your family and medical history, and a physical exam. You may also have tests, including:  Skin tests to see how your skin reacts to allergens that may be causing your symptoms. Tests include: ? Skin prick test. For this test, an allergen is introduced to your body through a small opening in the skin. ? Intradermal skin test. For this test, a small amount of allergen is injected under the first layer of your skin. ? Patch test. For this test, a small amount of allergen is placed on your skin. The area is covered and then checked after a few days.  Blood tests.  A challenge test. For this test, you will eat or breathe in a small amount of allergen to see if you have an allergic reaction. You may also be asked to:  Keep a food diary. This is a record of all the foods, drinks, and symptoms you have in a day.  Try an elimination diet. To do this: ? Remove certain foods from your diet. ? Add those foods back one by one to find out if any foods cause an allergic reaction. How is this treated? Treatment for allergies depends on your symptoms. Treatment may include:  Cold, wet cloths (cold compresses) to soothe itching and swelling.  Eye drops or nasal sprays.  Nasal irrigation to help clear your mucus or keep the nasal passages moist.  A humidifier to add moisture to  the air.  Skin creams to treat rashes or itching.  Oral antihistamines or other medicines to block the reaction or to treat inflammation.  Diet changes to remove foods that cause allergies.  Being exposed again and again to tiny amounts of allergens to help you build a defense against it (tolerance). This is called immunotherapy. Examples  include: ? Allergy shot. You receive an injection that contains an allergen. ? Sublingual immunotherapy. You take a small dose of allergen under your tongue.  Emergency injection for anaphylaxis. You give yourself a shot using a syringe (auto-injector) that contains the amount of medicine you need. Your health care provider will teach you how to give yourself an injection.      Follow these instructions at home: Medicines  Take or apply over-the-counter and prescription medicines only as told by your health care provider.  Always carry your auto-injector pen if you are at risk of anaphylaxis. Give yourself an injection as told by your health care provider.   Eating and drinking  Follow instructions from your health care provider about eating or drinking restrictions.  Drink enough fluid to keep your urine pale yellow. General instructions  Wear a medical alert bracelet or necklace to let others know that you have had anaphylaxis before.  Avoid known allergens whenever possible.  Keep all follow-up visits as told by your health care provider. This is important. Contact a health care provider if:  Your symptoms do not get better with treatment. Get help right away if:  You have symptoms of anaphylaxis. These include: ? Swollen mouth, tongue, or throat. ? Pain or tightness in your chest. ? Trouble breathing or shortness of breath. ? Dizziness or fainting. ? Severe abdominal pain, vomiting, or diarrhea. These symptoms may represent a serious problem that is an emergency. Do not wait to see if the symptoms will go away. Get medical help right away. Call your local emergency services (911 in the [place] Do not drive yourself to the hospital. Summary  Take or apply over-the-counter and prescription medicines only as told by your health care provider.  Avoid known allergens when possible.  Always carry your auto-injector pen if you are at risk of anaphylaxis. Give yourself an  injection as told by your health care provider.  Wear a medical alert bracelet or necklace to let others know that you have had anaphylaxis before.  Anaphylaxis is a life-threatening emergency. Get help right away. This information is not intended to replace advice given to you by your health care provider. Make sure you discuss any questions you have with your health care provider. Document Revised: 05/27/2020 Document Reviewed: 08/09/2019 Elsevier Patient Education  2021 [place] to follow-up with primary care physician in 1 week. Advised to follow-up with cardiology appointment is scheduled, Advised to continue hemodialysis as per schedule.

## 2021-01-19 ENCOUNTER — Telehealth: Payer: Self-pay | Admitting: Nephrology

## 2021-01-19 DIAGNOSIS — R2681 Unsteadiness on feet: Secondary | ICD-10-CM | POA: Diagnosis not present

## 2021-01-19 DIAGNOSIS — I38 Endocarditis, valve unspecified: Secondary | ICD-10-CM | POA: Diagnosis not present

## 2021-01-19 DIAGNOSIS — Z992 Dependence on renal dialysis: Secondary | ICD-10-CM | POA: Diagnosis not present

## 2021-01-19 DIAGNOSIS — E1122 Type 2 diabetes mellitus with diabetic chronic kidney disease: Secondary | ICD-10-CM | POA: Diagnosis not present

## 2021-01-19 DIAGNOSIS — N186 End stage renal disease: Secondary | ICD-10-CM | POA: Diagnosis not present

## 2021-01-19 DIAGNOSIS — M4627 Osteomyelitis of vertebra, lumbosacral region: Secondary | ICD-10-CM | POA: Diagnosis not present

## 2021-01-19 DIAGNOSIS — I358 Other nonrheumatic aortic valve disorders: Secondary | ICD-10-CM | POA: Diagnosis not present

## 2021-01-19 DIAGNOSIS — E1142 Type 2 diabetes mellitus with diabetic polyneuropathy: Secondary | ICD-10-CM | POA: Diagnosis not present

## 2021-01-19 NOTE — Telephone Encounter (Signed)
Transition of Care Contact from [place]   Date of Discharge: 01/18/21 Date of Contact: 01/19/21 Method of contact: phone Talked to patient   Patient contacted to discuss transition of care form recent hospitaliztion. Patient was admitted to [facility] from 4/4 to 4/9 with the discharge diagnosis of volume overload, cardiac arrest likely 2/2 hyperkalemia, and anaphylatic reaction.     Patient reports right sided CP and orthopnea today, feels like he has increased fluid.  Advised if continues or worsens to go to the ED, otherwise he can call OP dialysis center in the AM and see if he can get an extra HD tomorrow.   Medication changes were reviewed.   Patient will follow up with is outpatient dialysis center 01/20/21.   Other follow up needs include extra HD, will relay to dialysis center that patient requesting extra HD.   Jen Mow, PA-C [facility] Pager: [phone]

## 2021-01-20 ENCOUNTER — Ambulatory Visit (INDEPENDENT_AMBULATORY_CARE_PROVIDER_SITE_OTHER): Payer: Medicare HMO | Admitting: Podiatry

## 2021-01-20 ENCOUNTER — Other Ambulatory Visit: Payer: Self-pay

## 2021-01-20 ENCOUNTER — Telehealth: Payer: Self-pay

## 2021-01-20 DIAGNOSIS — E1149 Type 2 diabetes mellitus with other diabetic neurological complication: Secondary | ICD-10-CM

## 2021-01-20 DIAGNOSIS — N186 End stage renal disease: Secondary | ICD-10-CM | POA: Diagnosis not present

## 2021-01-20 DIAGNOSIS — L97512 Non-pressure chronic ulcer of other part of right foot with fat layer exposed: Secondary | ICD-10-CM | POA: Diagnosis not present

## 2021-01-20 DIAGNOSIS — Z992 Dependence on renal dialysis: Secondary | ICD-10-CM | POA: Diagnosis not present

## 2021-01-20 MED ORDER — MUPIROCIN 2 % EX OINT: 1.0000 "application " | TOPICAL_OINTMENT | Freq: Two times a day (BID) | CUTANEOUS | 2 refills | Status: DC

## 2021-01-20 NOTE — Telephone Encounter (Signed)
Orders for ulcer of right fourth and fifth toes with the fat layer exposed for dressing supplies faxed to [facility]  Conforming roll gauze daily Sterile gauze 2 x 2 daily Sterile gauze 4 x 4 daily Paper tape, one inch daily

## 2021-01-21 ENCOUNTER — Encounter: Payer: Self-pay | Admitting: Podiatry

## 2021-01-21 DIAGNOSIS — L97509 Non-pressure chronic ulcer of other part of unspecified foot with unspecified severity: Secondary | ICD-10-CM | POA: Diagnosis not present

## 2021-01-21 DIAGNOSIS — N2581 Secondary hyperparathyroidism of renal origin: Secondary | ICD-10-CM | POA: Diagnosis not present

## 2021-01-21 DIAGNOSIS — Z992 Dependence on renal dialysis: Secondary | ICD-10-CM | POA: Diagnosis not present

## 2021-01-21 DIAGNOSIS — N186 End stage renal disease: Secondary | ICD-10-CM | POA: Diagnosis not present

## 2021-01-21 MED ORDER — MUPIROCIN 2 % EX OINT: 1.0000 "application " | TOPICAL_OINTMENT | Freq: Two times a day (BID) | CUTANEOUS | 2 refills | Status: DC

## 2021-01-21 NOTE — Progress Notes (Signed)
  Subjective:  Patient ID: [name] [name], male    DOB: 27-Mar-1966,  MRN: [id number]  Chief Complaint  Patient presents with  . Callouses    Pre-ulcerative callus bilateral  . Peripheral Neuropathy      right foot 4th and 5th toe-burning sensation-[facility]    55 y.o. male presents with the above complaint. History confirmed with patient.  Returns for ulcerations on his right toes which have returned mostly on the fourth and fifth Objective:  Physical Exam: warm, good capillary refill and normal DP and PT pulses.  Loss of protective sensation.  He has dorsal scabbing of the fourth and fifth toes with superficial ulcerations, no signs of infection, no exposed tendon bone or joint  Assessment:   1. Ulcer of toe of right foot, with fat layer exposed ([place])   2. Type II diabetes mellitus with neurological manifestations ([place])   3. ESRD on dialysis [facility])      Plan:  Patient was evaluated and treated and all questions answered.  - Recommend daily dressing changes with mupirocin ointment -Dressing order from [facility] was sent for him -Return for follow-up with Dr. [name]   No follow-ups on file.

## 2021-01-22 DIAGNOSIS — R69 Illness, unspecified: Secondary | ICD-10-CM | POA: Diagnosis not present

## 2021-01-23 DIAGNOSIS — N2581 Secondary hyperparathyroidism of renal origin: Secondary | ICD-10-CM | POA: Diagnosis not present

## 2021-01-23 DIAGNOSIS — N186 End stage renal disease: Secondary | ICD-10-CM | POA: Diagnosis not present

## 2021-01-23 DIAGNOSIS — Z992 Dependence on renal dialysis: Secondary | ICD-10-CM | POA: Diagnosis not present

## 2021-01-24 DIAGNOSIS — Z992 Dependence on renal dialysis: Secondary | ICD-10-CM | POA: Diagnosis not present

## 2021-01-24 DIAGNOSIS — R6889 Other general symptoms and signs: Secondary | ICD-10-CM | POA: Diagnosis not present

## 2021-01-24 DIAGNOSIS — N186 End stage renal disease: Secondary | ICD-10-CM | POA: Diagnosis not present

## 2021-01-24 DIAGNOSIS — R918 Other nonspecific abnormal finding of lung field: Secondary | ICD-10-CM | POA: Diagnosis not present

## 2021-01-25 DIAGNOSIS — N186 End stage renal disease: Secondary | ICD-10-CM | POA: Diagnosis not present

## 2021-01-25 DIAGNOSIS — N2581 Secondary hyperparathyroidism of renal origin: Secondary | ICD-10-CM | POA: Diagnosis not present

## 2021-01-25 DIAGNOSIS — Z992 Dependence on renal dialysis: Secondary | ICD-10-CM | POA: Diagnosis not present

## 2021-01-27 ENCOUNTER — Telehealth: Payer: Self-pay

## 2021-01-27 DIAGNOSIS — N186 End stage renal disease: Secondary | ICD-10-CM | POA: Diagnosis not present

## 2021-01-27 DIAGNOSIS — N2581 Secondary hyperparathyroidism of renal origin: Secondary | ICD-10-CM | POA: Diagnosis not present

## 2021-01-27 DIAGNOSIS — Z992 Dependence on renal dialysis: Secondary | ICD-10-CM | POA: Diagnosis not present

## 2021-01-27 NOTE — Telephone Encounter (Signed)
**Note De-Identified [name] [name] Obfuscation** I starteda Pantoprazole PA through covermymeds. [id number]

## 2021-01-29 NOTE — Telephone Encounter (Signed)
**Note De-Identified Jaaziel Peatross Obfuscation** Letter received from [facility] stating that they have approved the pt Pantoprazole PA until 10/11/2021. I have notified [place] of this approval Shafiq Larch VM.

## 2021-01-30 ENCOUNTER — Ambulatory Visit: Payer: Medicare HMO | Admitting: Podiatry

## 2021-01-30 DIAGNOSIS — Z992 Dependence on renal dialysis: Secondary | ICD-10-CM | POA: Diagnosis not present

## 2021-01-30 DIAGNOSIS — N186 End stage renal disease: Secondary | ICD-10-CM | POA: Diagnosis not present

## 2021-01-30 DIAGNOSIS — N2581 Secondary hyperparathyroidism of renal origin: Secondary | ICD-10-CM | POA: Diagnosis not present

## 2021-02-01 DIAGNOSIS — N186 End stage renal disease: Secondary | ICD-10-CM | POA: Diagnosis not present

## 2021-02-01 DIAGNOSIS — N2581 Secondary hyperparathyroidism of renal origin: Secondary | ICD-10-CM | POA: Diagnosis not present

## 2021-02-01 DIAGNOSIS — Z992 Dependence on renal dialysis: Secondary | ICD-10-CM | POA: Diagnosis not present

## 2021-02-03 ENCOUNTER — Encounter: Payer: Self-pay | Admitting: Podiatry

## 2021-02-03 ENCOUNTER — Ambulatory Visit (INDEPENDENT_AMBULATORY_CARE_PROVIDER_SITE_OTHER): Payer: Medicare HMO

## 2021-02-03 ENCOUNTER — Ambulatory Visit (INDEPENDENT_AMBULATORY_CARE_PROVIDER_SITE_OTHER): Payer: Medicare HMO | Admitting: Podiatry

## 2021-02-03 ENCOUNTER — Other Ambulatory Visit: Payer: Self-pay

## 2021-02-03 DIAGNOSIS — N186 End stage renal disease: Secondary | ICD-10-CM | POA: Diagnosis not present

## 2021-02-03 DIAGNOSIS — L97512 Non-pressure chronic ulcer of other part of right foot with fat layer exposed: Secondary | ICD-10-CM

## 2021-02-03 DIAGNOSIS — Z992 Dependence on renal dialysis: Secondary | ICD-10-CM | POA: Diagnosis not present

## 2021-02-03 DIAGNOSIS — E1149 Type 2 diabetes mellitus with other diabetic neurological complication: Secondary | ICD-10-CM | POA: Diagnosis not present

## 2021-02-03 DIAGNOSIS — L84 Corns and callosities: Secondary | ICD-10-CM | POA: Diagnosis not present

## 2021-02-03 DIAGNOSIS — N2581 Secondary hyperparathyroidism of renal origin: Secondary | ICD-10-CM | POA: Diagnosis not present

## 2021-02-03 MED ORDER — SILVER SULFADIAZINE 1 % EX CREA: 1.0000 "application " | TOPICAL_CREAM | Freq: Every day | CUTANEOUS | 0 refills | Status: DC

## 2021-02-03 MED ORDER — CEPHALEXIN 500 MG PO CAPS: 500.0000 mg | ORAL_CAPSULE | Freq: Two times a day (BID) | ORAL | 0 refills | Status: DC

## 2021-02-04 DIAGNOSIS — Z992 Dependence on renal dialysis: Secondary | ICD-10-CM | POA: Diagnosis not present

## 2021-02-04 DIAGNOSIS — Z539 Procedure and treatment not carried out, unspecified reason: Secondary | ICD-10-CM | POA: Diagnosis not present

## 2021-02-04 DIAGNOSIS — E1122 Type 2 diabetes mellitus with diabetic chronic kidney disease: Secondary | ICD-10-CM | POA: Diagnosis not present

## 2021-02-04 DIAGNOSIS — I120 Hypertensive chronic kidney disease with stage 5 chronic kidney disease or end stage renal disease: Secondary | ICD-10-CM | POA: Diagnosis not present

## 2021-02-04 DIAGNOSIS — R6889 Other general symptoms and signs: Secondary | ICD-10-CM | POA: Diagnosis not present

## 2021-02-04 DIAGNOSIS — N186 End stage renal disease: Secondary | ICD-10-CM | POA: Diagnosis not present

## 2021-02-04 DIAGNOSIS — Z794 Long term (current) use of insulin: Secondary | ICD-10-CM | POA: Diagnosis not present

## 2021-02-06 DIAGNOSIS — N2581 Secondary hyperparathyroidism of renal origin: Secondary | ICD-10-CM | POA: Diagnosis not present

## 2021-02-06 DIAGNOSIS — N186 End stage renal disease: Secondary | ICD-10-CM | POA: Diagnosis not present

## 2021-02-06 DIAGNOSIS — Z992 Dependence on renal dialysis: Secondary | ICD-10-CM | POA: Diagnosis not present

## 2021-02-07 ENCOUNTER — Other Ambulatory Visit: Payer: Self-pay | Admitting: Internal Medicine

## 2021-02-07 DIAGNOSIS — M4627 Osteomyelitis of vertebra, lumbosacral region: Secondary | ICD-10-CM | POA: Diagnosis not present

## 2021-02-07 DIAGNOSIS — I358 Other nonrheumatic aortic valve disorders: Secondary | ICD-10-CM | POA: Diagnosis not present

## 2021-02-07 DIAGNOSIS — R2681 Unsteadiness on feet: Secondary | ICD-10-CM | POA: Diagnosis not present

## 2021-02-07 DIAGNOSIS — E1142 Type 2 diabetes mellitus with diabetic polyneuropathy: Secondary | ICD-10-CM | POA: Diagnosis not present

## 2021-02-07 DIAGNOSIS — I38 Endocarditis, valve unspecified: Secondary | ICD-10-CM | POA: Diagnosis not present

## 2021-02-07 DIAGNOSIS — K6289 Other specified diseases of anus and rectum: Secondary | ICD-10-CM

## 2021-02-07 DIAGNOSIS — R197 Diarrhea, unspecified: Secondary | ICD-10-CM

## 2021-02-07 NOTE — Progress Notes (Signed)
Subjective: 55 year old male presents the office today for evaluation of calluses, wounds on the right fourth and fifth toes.  He was last seen by Dr. Sherryle Lis for this on April 11.  Has been applying mupirocin ointment.  Denies any drainage or pus or any swelling. He is not sure how this started. Denies any systemic complaints such as fevers, chills, nausea, vomiting. No acute changes since last appointment, and no other complaints at this time.   Objective: AAO x3, NAD DP/PT pulses palpable bilaterally, CRT less than 3 seconds On the dorsal aspect of right fourth and fifth toes there is hyperkeratotic tissue on the entire dorsal aspect the toe appears clear there is some superficial granular tissue along the periphery.  It almost appears that there was an old blister which is callused over or burn.  There is no probing, undermining or tunneling.  There is no edema to the toe there is no erythema or warmth.  Minimal amount of bloody drainage expressed there is no purulence. No pain with calf compression, swelling, warmth, erythema  Assessment: Calluses, wounds right fourth and fifth digits  Plan: -All treatment options discussed with the patient including all alternatives, risks, complications.  -Due to the new wounds x-rays were obtained and reviewed.  No evidence of acute osteomyelitis.  There are some questionable changes to the fourth toe distal phalanx but I think this is more chronic due to inflammation.  There is no soft tissue edema. -I sharply debrided the hyperkeratotic lesions without any complications or bleeding.  Continue mupirocin ointment.  Surgical shoe.  Offloading and elevation. -Patient encouraged to call the office with any questions, concerns, change in symptoms.   Trula Slade DPM

## 2021-02-08 DIAGNOSIS — Z992 Dependence on renal dialysis: Secondary | ICD-10-CM | POA: Diagnosis not present

## 2021-02-08 DIAGNOSIS — N2581 Secondary hyperparathyroidism of renal origin: Secondary | ICD-10-CM | POA: Diagnosis not present

## 2021-02-08 DIAGNOSIS — N186 End stage renal disease: Secondary | ICD-10-CM | POA: Diagnosis not present

## 2021-02-10 ENCOUNTER — Other Ambulatory Visit: Payer: Self-pay | Admitting: Internal Medicine

## 2021-02-10 ENCOUNTER — Other Ambulatory Visit: Payer: Self-pay | Admitting: Endocrinology

## 2021-02-11 DIAGNOSIS — Z992 Dependence on renal dialysis: Secondary | ICD-10-CM | POA: Diagnosis not present

## 2021-02-11 DIAGNOSIS — N2581 Secondary hyperparathyroidism of renal origin: Secondary | ICD-10-CM | POA: Diagnosis not present

## 2021-02-11 DIAGNOSIS — R0602 Shortness of breath: Secondary | ICD-10-CM | POA: Insufficient documentation

## 2021-02-11 DIAGNOSIS — N186 End stage renal disease: Secondary | ICD-10-CM | POA: Diagnosis not present

## 2021-02-13 ENCOUNTER — Other Ambulatory Visit: Payer: Self-pay

## 2021-02-13 ENCOUNTER — Inpatient Hospital Stay (HOSPITAL_COMMUNITY)
Admission: EM | Admit: 2021-02-13 | Discharge: 2021-02-18 | DRG: 246 | Disposition: A | Discharge status: Home / Self Care | Payer: Medicare HMO | Attending: Internal Medicine | Admitting: Internal Medicine

## 2021-02-13 ENCOUNTER — Encounter (HOSPITAL_COMMUNITY): Payer: Self-pay | Admitting: Emergency Medicine

## 2021-02-13 ENCOUNTER — Emergency Department (HOSPITAL_COMMUNITY): Payer: Medicare HMO

## 2021-02-13 DIAGNOSIS — Z7901 Long term (current) use of anticoagulants: Secondary | ICD-10-CM

## 2021-02-13 DIAGNOSIS — J449 Chronic obstructive pulmonary disease, unspecified: Secondary | ICD-10-CM | POA: Diagnosis present

## 2021-02-13 DIAGNOSIS — Z885 Allergy status to narcotic agent status: Secondary | ICD-10-CM

## 2021-02-13 DIAGNOSIS — E875 Hyperkalemia: Secondary | ICD-10-CM | POA: Diagnosis present

## 2021-02-13 DIAGNOSIS — K219 Gastro-esophageal reflux disease without esophagitis: Secondary | ICD-10-CM | POA: Diagnosis present

## 2021-02-13 DIAGNOSIS — Z7952 Long term (current) use of systemic steroids: Secondary | ICD-10-CM

## 2021-02-13 DIAGNOSIS — Z833 Family history of diabetes mellitus: Secondary | ICD-10-CM

## 2021-02-13 DIAGNOSIS — Z6841 Body Mass Index (BMI) 40.0 and over, adult: Secondary | ICD-10-CM

## 2021-02-13 DIAGNOSIS — D509 Iron deficiency anemia, unspecified: Secondary | ICD-10-CM | POA: Diagnosis present

## 2021-02-13 DIAGNOSIS — Z20822 Contact with and (suspected) exposure to covid-19: Secondary | ICD-10-CM | POA: Diagnosis present

## 2021-02-13 DIAGNOSIS — Z940 Kidney transplant status: Secondary | ICD-10-CM | POA: Diagnosis not present

## 2021-02-13 DIAGNOSIS — Y838 Other surgical procedures as the cause of abnormal reaction of the patient, or of later complication, without mention of misadventure at the time of the procedure: Secondary | ICD-10-CM | POA: Diagnosis present

## 2021-02-13 DIAGNOSIS — R0789 Other chest pain: Secondary | ICD-10-CM | POA: Diagnosis not present

## 2021-02-13 DIAGNOSIS — Z79899 Other long term (current) drug therapy: Secondary | ICD-10-CM

## 2021-02-13 DIAGNOSIS — I9589 Other hypotension: Secondary | ICD-10-CM | POA: Diagnosis present

## 2021-02-13 DIAGNOSIS — E1165 Type 2 diabetes mellitus with hyperglycemia: Secondary | ICD-10-CM | POA: Diagnosis present

## 2021-02-13 DIAGNOSIS — I132 Hypertensive heart and chronic kidney disease with heart failure and with stage 5 chronic kidney disease, or end stage renal disease: Secondary | ICD-10-CM | POA: Diagnosis present

## 2021-02-13 DIAGNOSIS — J810 Acute pulmonary edema: Secondary | ICD-10-CM | POA: Diagnosis not present

## 2021-02-13 DIAGNOSIS — E1142 Type 2 diabetes mellitus with diabetic polyneuropathy: Secondary | ICD-10-CM | POA: Diagnosis present

## 2021-02-13 DIAGNOSIS — Z955 Presence of coronary angioplasty implant and graft: Secondary | ICD-10-CM

## 2021-02-13 DIAGNOSIS — E1129 Type 2 diabetes mellitus with other diabetic kidney complication: Secondary | ICD-10-CM | POA: Diagnosis not present

## 2021-02-13 DIAGNOSIS — J811 Chronic pulmonary edema: Secondary | ICD-10-CM

## 2021-02-13 DIAGNOSIS — N2581 Secondary hyperparathyroidism of renal origin: Secondary | ICD-10-CM | POA: Diagnosis present

## 2021-02-13 DIAGNOSIS — I214 Non-ST elevation (NSTEMI) myocardial infarction: Secondary | ICD-10-CM | POA: Diagnosis present

## 2021-02-13 DIAGNOSIS — Z560 Unemployment, unspecified: Secondary | ICD-10-CM

## 2021-02-13 DIAGNOSIS — I5020 Unspecified systolic (congestive) heart failure: Secondary | ICD-10-CM | POA: Diagnosis present

## 2021-02-13 DIAGNOSIS — Z8249 Family history of ischemic heart disease and other diseases of the circulatory system: Secondary | ICD-10-CM

## 2021-02-13 DIAGNOSIS — I4892 Unspecified atrial flutter: Secondary | ICD-10-CM | POA: Diagnosis present

## 2021-02-13 DIAGNOSIS — I517 Cardiomegaly: Secondary | ICD-10-CM | POA: Diagnosis not present

## 2021-02-13 DIAGNOSIS — Z4901 Encounter for fitting and adjustment of extracorporeal dialysis catheter: Secondary | ICD-10-CM | POA: Diagnosis not present

## 2021-02-13 DIAGNOSIS — Z888 Allergy status to other drugs, medicaments and biological substances status: Secondary | ICD-10-CM

## 2021-02-13 DIAGNOSIS — R079 Chest pain, unspecified: Secondary | ICD-10-CM | POA: Diagnosis present

## 2021-02-13 DIAGNOSIS — I4891 Unspecified atrial fibrillation: Secondary | ICD-10-CM | POA: Diagnosis present

## 2021-02-13 DIAGNOSIS — Z91013 Allergy to seafood: Secondary | ICD-10-CM

## 2021-02-13 DIAGNOSIS — Z992 Dependence on renal dialysis: Secondary | ICD-10-CM

## 2021-02-13 DIAGNOSIS — I447 Left bundle-branch block, unspecified: Secondary | ICD-10-CM | POA: Diagnosis present

## 2021-02-13 DIAGNOSIS — G4733 Obstructive sleep apnea (adult) (pediatric): Secondary | ICD-10-CM | POA: Diagnosis present

## 2021-02-13 DIAGNOSIS — I959 Hypotension, unspecified: Secondary | ICD-10-CM | POA: Diagnosis not present

## 2021-02-13 DIAGNOSIS — E1122 Type 2 diabetes mellitus with diabetic chronic kidney disease: Secondary | ICD-10-CM | POA: Diagnosis present

## 2021-02-13 DIAGNOSIS — T82898A Other specified complication of vascular prosthetic devices, implants and grafts, initial encounter: Secondary | ICD-10-CM | POA: Diagnosis not present

## 2021-02-13 DIAGNOSIS — R0600 Dyspnea, unspecified: Secondary | ICD-10-CM | POA: Diagnosis not present

## 2021-02-13 DIAGNOSIS — N186 End stage renal disease: Secondary | ICD-10-CM | POA: Diagnosis present

## 2021-02-13 DIAGNOSIS — I4510 Unspecified right bundle-branch block: Secondary | ICD-10-CM | POA: Diagnosis present

## 2021-02-13 DIAGNOSIS — Z8674 Personal history of sudden cardiac arrest: Secondary | ICD-10-CM

## 2021-02-13 DIAGNOSIS — Z95828 Presence of other vascular implants and grafts: Secondary | ICD-10-CM | POA: Diagnosis not present

## 2021-02-13 DIAGNOSIS — R6889 Other general symptoms and signs: Secondary | ICD-10-CM | POA: Diagnosis not present

## 2021-02-13 DIAGNOSIS — R778 Other specified abnormalities of plasma proteins: Secondary | ICD-10-CM | POA: Diagnosis not present

## 2021-02-13 DIAGNOSIS — I252 Old myocardial infarction: Secondary | ICD-10-CM

## 2021-02-13 DIAGNOSIS — T8241XA Breakdown (mechanical) of vascular dialysis catheter, initial encounter: Secondary | ICD-10-CM | POA: Diagnosis not present

## 2021-02-13 DIAGNOSIS — D631 Anemia in chronic kidney disease: Secondary | ICD-10-CM | POA: Diagnosis present

## 2021-02-13 DIAGNOSIS — N250 Renal osteodystrophy: Secondary | ICD-10-CM | POA: Diagnosis not present

## 2021-02-13 DIAGNOSIS — R791 Abnormal coagulation profile: Secondary | ICD-10-CM | POA: Diagnosis present

## 2021-02-13 DIAGNOSIS — Z91014 Allergy to mammalian meats: Secondary | ICD-10-CM

## 2021-02-13 DIAGNOSIS — T8249XA Other complication of vascular dialysis catheter, initial encounter: Secondary | ICD-10-CM | POA: Diagnosis present

## 2021-02-13 DIAGNOSIS — E119 Type 2 diabetes mellitus without complications: Secondary | ICD-10-CM

## 2021-02-13 DIAGNOSIS — I4819 Other persistent atrial fibrillation: Secondary | ICD-10-CM | POA: Diagnosis not present

## 2021-02-13 DIAGNOSIS — Z961 Presence of intraocular lens: Secondary | ICD-10-CM | POA: Diagnosis present

## 2021-02-13 DIAGNOSIS — I255 Ischemic cardiomyopathy: Secondary | ICD-10-CM | POA: Diagnosis not present

## 2021-02-13 DIAGNOSIS — Z09 Encounter for follow-up examination after completed treatment for conditions other than malignant neoplasm: Secondary | ICD-10-CM

## 2021-02-13 DIAGNOSIS — E8889 Other specified metabolic disorders: Secondary | ICD-10-CM | POA: Diagnosis present

## 2021-02-13 DIAGNOSIS — J90 Pleural effusion, not elsewhere classified: Secondary | ICD-10-CM | POA: Diagnosis not present

## 2021-02-13 DIAGNOSIS — N189 Chronic kidney disease, unspecified: Secondary | ICD-10-CM | POA: Diagnosis present

## 2021-02-13 DIAGNOSIS — Z9841 Cataract extraction status, right eye: Secondary | ICD-10-CM

## 2021-02-13 DIAGNOSIS — Z794 Long term (current) use of insulin: Secondary | ICD-10-CM | POA: Diagnosis not present

## 2021-02-13 DIAGNOSIS — E11649 Type 2 diabetes mellitus with hypoglycemia without coma: Secondary | ICD-10-CM | POA: Diagnosis not present

## 2021-02-13 DIAGNOSIS — I5022 Chronic systolic (congestive) heart failure: Secondary | ICD-10-CM | POA: Diagnosis present

## 2021-02-13 DIAGNOSIS — R0602 Shortness of breath: Secondary | ICD-10-CM | POA: Diagnosis not present

## 2021-02-13 DIAGNOSIS — I2510 Atherosclerotic heart disease of native coronary artery without angina pectoris: Secondary | ICD-10-CM | POA: Diagnosis present

## 2021-02-13 DIAGNOSIS — E785 Hyperlipidemia, unspecified: Secondary | ICD-10-CM | POA: Diagnosis present

## 2021-02-13 DIAGNOSIS — I429 Cardiomyopathy, unspecified: Secondary | ICD-10-CM | POA: Diagnosis not present

## 2021-02-13 DIAGNOSIS — Z9842 Cataract extraction status, left eye: Secondary | ICD-10-CM

## 2021-02-13 DIAGNOSIS — Z86718 Personal history of other venous thrombosis and embolism: Secondary | ICD-10-CM

## 2021-02-13 LAB — BASIC METABOLIC PANEL
Anion gap: 8 (ref 5–15)
BUN: 36 mg/dL — ABNORMAL HIGH (ref 6–20)
CO2: 32 mmol/L (ref 22–32)
Calcium: 8.6 mg/dL — ABNORMAL LOW (ref 8.9–10.3)
Chloride: 97 mmol/L — ABNORMAL LOW (ref 98–111)
Creatinine, Ser: 7.7 mg/dL — ABNORMAL HIGH (ref 0.61–1.24)
GFR, Estimated: 8 mL/min — ABNORMAL LOW (ref >60)
Glucose, Bld: 298 mg/dL — ABNORMAL HIGH (ref 70–99)
Potassium: 4.7 mmol/L (ref 3.5–5.1)
Sodium: 137 mmol/L (ref 135–145)

## 2021-02-13 LAB — CBC
HCT: 39.9 % (ref 39.0–52.0)
Hemoglobin: 11.4 g/dL — ABNORMAL LOW (ref 13.0–17.0)
MCH: 29.2 pg (ref 26.0–34.0)
MCHC: 28.6 g/dL — ABNORMAL LOW (ref 30.0–36.0)
MCV: 102 fL — ABNORMAL HIGH (ref 80.0–100.0)
Platelets: 223 10*3/uL (ref 150–400)
RBC: 3.91 MIL/uL — ABNORMAL LOW (ref 4.22–5.81)
RDW: 21.8 % — ABNORMAL HIGH (ref 11.5–15.5)
WBC: 8.2 10*3/uL (ref 4.0–10.5)
nRBC: 0 % (ref 0.0–0.2)

## 2021-02-13 LAB — RESP PANEL BY RT-PCR (FLU A&B, COVID) ARPGX2
Influenza A by PCR: NEGATIVE
Influenza B by PCR: NEGATIVE
SARS Coronavirus 2 by RT PCR: NEGATIVE

## 2021-02-13 LAB — TROPONIN I (HIGH SENSITIVITY): Troponin I (High Sensitivity): 572 ng/L (ref <18)

## 2021-02-13 LAB — PROTIME-INR
INR: 1.6 — ABNORMAL HIGH (ref 0.8–1.2)
Prothrombin Time: 18.7 seconds — ABNORMAL HIGH (ref 11.4–15.2)

## 2021-02-13 NOTE — ED Triage Notes (Addendum)
Patient arrived with EMS from home reports central chest pain this evening with SOB , he did not complete his hemodialysis due to malfunction / occlusion of his dialysis catheter this morning . He received ASA mg prior to arrival . Hypotensive at arrival.

## 2021-02-13 NOTE — ED Provider Notes (Signed)
[facility] EMERGENCY DEPARTMENT Provider Note   CSN: [id number] Arrival date & time: 02/13/21  2222     History Chief Complaint  Patient presents with  . Chest Pain    Vascular Access Malfunction     Hagen Bohorquez is a 55 y.o. male.  55 year old male with history of stage renal disease on dialysis, A. fib, coronary artery disease and COPD the presents to the emergency room today secondary to chest pain.  Patient states this is his blood pressure gets low or his glucose is off he will have some chest pain.  He states he was at dialysis today and this is what happened.  His blood pressures have been low since then.  He also did not receive a full treatment secondary to his catheter clogging.  He was told at dialysis that he needed to get his tunneled catheter replaced. No fever. Cough, back pain. Or abdominal pain. States he has an appointment in a few days to get a new graft at [facility].     Chest Pain      Past Medical History:  Diagnosis Date  . Allergic rhinitis   . Allergy   . Anemia   . Aortic insufficiency    SBE in 2016 >> pt declined AVR // a. Echo 7/16:  Mod LVH, EF 50-55%, mild to mod AI, severe LAE, PASP 57 mmHg (LV ID end diastolic 95.0 mm);  b. Echo 2/17:  Mod LVH, EF 50-55%, mod AI, MAC, mild MR, mod LAE, mild to mod TR, PASP 63 mmHg // Echo 2/21: normal EF, mild MR, mild AI  . Atrial fibrillation and flutter ([place])    chronic anticoag with Warfarin // bradycardia noted during admit 10/21  . Bilateral carpal tunnel syndrome   . Bradycardia    brady/low BP event during admit in 10/21 >> poor candidate for pacer/EPS due to high risk of infection with ESRD; pacer not recommended by EP  . CAD (coronary artery disease)    s/p NSTEMI 10/21: cath>> pLAD 99 >> tx with DES; Lat RI 100 and OM1 100 - med Rx  . Cataract    removed both eyes   . Cholecystitis    dx during admx 10/21 >> HIDA neg // exam benign // high risk for surgery >> no surgical  intervention recommended.   . Claustrophobia   . COPD (chronic obstructive pulmonary disease) ([place])   . Coronary artery disease   . Diabetes mellitus    Type II  . Diabetic peripheral neuropathy ([place])   . Diabetic retinopathy ([place])   . Discitis of lumbar region    resolved  . DVT (deep venous thrombosis) ([employer]) 10/07/2015   LLE  . Dysrhythmia 07/2020   afib  . Endocarditis 11/28/14  . ESRD (end stage renal disease) on dialysis [facility])    s/p renal transplant in 2009, failed in 2016  . Gait disorder   . Gastroparesis   . GERD (gastroesophageal reflux disease)   . Gout   . Hearing difficulty   . Hemodialysis patient ([place])    tuesday, thursday, saturday   . HFrEF (HF with reduced EF)    Ischemic CM // Echo 10/21: EF 40-45, dist sept and apical AK, mild LVH, normal RVSF, mod MAC  . Hyperlipidemia   . Hypertension   . Incomplete bladder emptying   . Leg pain 02/05/11   with walking  . Morbid obesity ([place])   . Myocardial infarction ([place]) 07/2020  . Obstructive sleep apnea  not using CPAP- lost 100lbs  . Osteomyelitis ([place])   . Posttraumatic stress disorder   . Rotator cuff disorder   . Secondary hyperparathyroidism ([place])   . Sepsis ([place])    Postive blood cultures -   . Sleep apnea    no cpap  . Urethral stricture   . Vitamin D deficiency     Patient Active Problem List   Diagnosis Date Noted  . Cardiac arrest ([place]) 01/14/2021  . Disorder of phosphorus metabolism, unspecified 01/06/2021  . Unspecified infection due to central venous catheter, initial encounter 01/02/2021  . Hemodialysis catheter dysfunction ([place]) 11/08/2020  . ESRD (end stage renal disease) ([place]) 11/08/2020  . Bradycardia 08/03/2020  . Hypotension 08/02/2020  . CAD (coronary artery disease) 08/02/2020  . Shock - Cardiogenic vs Septic   . NSTEMI (non-ST elevated myocardial infarction) ([place]) 07/27/2020  . Atrial Fibrillation/Flutter 07/27/2020  . Band keratopathy of both eyes 11/10/2019  .  Pseudophakia of both eyes 11/10/2019  . Routine general medical examination at a health care facility 07/21/2019  . Complication of vascular dialysis catheter 07/17/2019  . Cholecystitis 04/20/2019  . Allergy, unspecified, sequela 12/29/2018  . Pain, unspecified 12/29/2018  . Leg lesion 06/16/2018  . Protein calorie malnutrition ([place]) 05/09/2018  . Hyperkalemia 04/11/2018  . Hemorrhoid 02/28/2018  . Numbness 10/20/2017  . Fluid overload, unspecified 06/04/2016  . Hypocalcemia 02/20/2016  . Anterior urethral stricture 12/09/2015  . Iron deficiency anemia, unspecified 11/30/2015  . Dependence on renal dialysis ([place]) 11/27/2015  . ESRD on dialysis ([place]) 11/15/2015  . Coagulation defect, unspecified ([place]) 11/02/2015  . HFrEF (heart failure with reduced ejection fraction) ([place]) 10/07/2015  . Prolonged Q-T interval on ECG 10/07/2015  . Chronic deep vein thrombosis (DVT) of left lower extremity ([place]) 10/07/2015  . Hypomagnesemia 06/26/2015  . Aortic insufficiency 04/12/2015  . Endocarditis 12/04/2014  . Lumbar discitis 11/22/2014  . Back pain at L4-L5 level   . Anemia due to chronic kidney disease   . Cervical radiculopathy   . Diabetic peripheral neuropathy ([place]) 10/16/2014  . Hematuria 08/06/2014  . Renal transplant, status post 04/05/2014  . OSA (obstructive sleep apnea) 04/05/2014  . Severe obesity (BMI >= 40) ([place]) 04/05/2014  . Benign fibroma of prostate 02/05/2014  . Bladder neck obstruction 02/05/2014  . Visceral obesity 04/08/2012  . Male infertility 03/28/2012  . Immunosuppressive management encounter following kidney transplant 03/24/2012  . Diabetic macular edema ([place]) 01/15/2012  . Diabetes mellitus, type 2 ([place]) 01/15/2012  . IMMUNOCOMPROMISED 01/30/2010  . Diarrhea 01/30/2010  . HYPERPARATHYROIDISM, SECONDARY 01/27/2010  . GOUT 01/27/2010  . Anxiety 01/27/2010  . INSOMNIA 01/27/2010  . HLD (hyperlipidemia) 05/25/2008  . Essential hypertension 05/25/2008     Past Surgical History:  Procedure Laterality Date  . ARTERIOVENOUS GRAFT PLACEMENT Bilateral "several"  . AV FISTULA PLACEMENT Bilateral 2015  . AV FISTULA PLACEMENT Right 06/13/2019   Procedure: INSERTION OF ARTERIOVENOUS (AV) GORE-TEX GRAFT RIGHT  ARM;  Surgeon: Elam Dutch, MD;  Location: [facility];  Service: Vascular;  Laterality: Right;  . [place] REMOVAL Right 11/08/2020   Procedure: Excision of Foreign Body;  Surgeon: [name], MD;  Location: [facility];  Service: Vascular;  Laterality: Right;  . BASCILIC VEIN TRANSPOSITION Right 09/27/2015  . [place] Right 09/27/2015   Procedure: BASILIC VEIN TRANSPOSITION right;  Surgeon: Mal Misty, MD;  Location: [facility];  Service: Vascular;  Laterality: Right;  . CARDIAC CATHETERIZATION  07/2020  . CATARACT EXTRACTION W/ INTRAOCULAR LENS  IMPLANT,  BILATERAL Bilateral   . CORONARY STENT INTERVENTION N/A 07/27/2020   Procedure: CORONARY STENT INTERVENTION;  Surgeon: Burnell Blanks, MD;  Location: [facility] CV LAB;  Service: Cardiovascular;  Laterality: N/A;  . CYSTOSCOPY W/ INTERNAL URETHROTOMY  09/2014  . EYE SURGERY Bilateral 2006  . INSERTION OF DIALYSIS CATHETER N/A 11/08/2020   Procedure: INSERTION OF DIALYSIS CATHETER;  Surgeon: [name], MD;  Location: [place] [facility];  Service: Vascular;  Laterality: N/A;  . IR THROMBECTOMY AV FISTULA W/THROMBOLYSIS/PTA INC/SHUNT/IMG RIGHT Right 08/01/2019  . IR US GUIDE VASC ACCESS RIGHT  08/01/2019  . KIDNEY TRANSPLANT  2009  . LEFT HEART CATH AND CORONARY ANGIOGRAPHY N/A 07/27/2020   Procedure: LEFT HEART CATH AND CORONARY ANGIOGRAPHY;  Surgeon: Burnell Blanks, MD;  Location: [facility] CV LAB;  Service: Cardiovascular;  Laterality: N/A;  . SIGMOIDOSCOPY    . UPPER EXTREMITY VENOGRAPHY Bilateral 06/02/2019   Procedure: UPPER EXTREMITY VENOGRAPHY;  Surgeon: Elam Dutch, MD;  Location: [place] [facility] CV LAB;  Service: Cardiovascular;  Laterality: Bilateral;   . UPPER GASTROINTESTINAL ENDOSCOPY    . VASCULAR ACCESS DEVICE INSERTION Right 11/07/2020   Procedure: RIGHT UPPER EXTREMITY HERO GRAFT;  Surgeon: [name], MD;  Location[facility];  Service: Vascular;  Laterality: Right;       Family History  Problem Relation Age of Onset  . Hypertension Mother   . Diabetes Mother   . Diabetes Father   . Diabetes Brother   . Colon cancer Neg Hx   . Colon polyps Neg Hx   . Esophageal cancer Neg Hx   . Rectal cancer Neg Hx   . Stomach cancer Neg Hx     Social History   Tobacco Use  . Smoking status: Never Smoker  . Smokeless tobacco: Never Used  Vaping Use  . Vaping Use: Never used  Substance Use Topics  . Alcohol use: No    Alcohol/week: 0.0 standard drinks  . Drug use: No    Home Medications Prior to Admission medications   Medication Sig Start Date End Date Taking? Authorizing Provider  Accu-Chek FastClix Lancets MISC USE TO CHECK BLOOD SUGARS 4 TIMES A DAY. 08/21/20   Elayne Snare, MD  allopurinol (ZYLOPRIM) 100 MG tablet Take 1 tablet (100 mg total) by mouth 3 (three) times a week. After dialysis Patient taking differently: Take 100 mg by mouth every Tuesday, Thursday, and Saturday at 6 PM. After dialysis 05/17/19   Hoyt Koch, MD  atorvastatin (LIPITOR) 80 MG tablet Take 1 tablet (80 mg total) by mouth daily. 01/17/21 01/17/22  Burnell Blanks, MD  AURYXIA 1 GM 210 MG(Fe) tablet Take 630 mg by mouth 3 (three) times daily with meals. Takes 3 tablets with meal and 2 tablet with snacks 11/08/19   [provider]  busPIRone (BUSPAR) 5 MG tablet TAKE 1 TABLET TWICE DAILY AS NEEDED Patient taking differently: Take 5 mg by mouth 2 (two) times daily. 01/14/21   Hoyt Koch, MD  cephALEXin (KEFLEX) 500 MG capsule Take 1 capsule (500 mg total) by mouth 2 (two) times daily. 02/03/21   Trula Slade, DPM  clonazePAM (KLONOPIN) 0.5 MG tablet Use 1-2 tablets prior to dialysis 3 times weekly. Patient taking  differently: Take 0.5 mg by mouth 2 (two) times daily. 10/29/20   Hoyt Koch, MD  clopidogrel (PLAVIX) 75 MG tablet Take 1 tablet (75 mg total) by mouth daily with breakfast. 09/26/20   Burnell Blanks, MD  diphenhydrAMINE (BENADRYL)  25 MG tablet Take 1 tablet (25 mg total) by mouth every 8 (eight) hours as needed for itching. 01/14/21   Ripley Fraise, MD  glucose blood (ACCU-CHEK GUIDE) test strip USE AS INSTRUCTED TO CHECK BLOOD SUGAR 3 TIMES DAILY AS NEEDED 08/21/20   Elayne Snare, MD  insulin glargine, 2 Unit Dial, (TOUJEO MAX SOLOSTAR) 300 UNIT/ML Solostar Pen INJECT 80 UNITS UNDER THE SKIN IN THE MORNING AND 80 UNITS IN THE EVENING Patient taking differently: Inject 80 Units into the skin 2 (two) times daily. 12/05/20   Elayne Snare, MD  Insulin Pen Needle (DROPLET PEN NEEDLES) 32G X 4 MM MISC USE TO INJECT FIVE TIMES DAILY AS DIRECTED 02/10/21   Elayne Snare, MD  Insulin Pen Needle 32G X 8 MM MISC Use with insulin x5 times a day Patient taking differently: 1 each by Other route See admin instructions. Use with insulin x5 times a day 08/16/19   Elayne Snare, MD  insulin regular human CONCENTRATED (HUMULIN R U-500 KWIKPEN) 500 UNIT/ML kwikpen Inject 50-70 Units into the skin See admin instructions. Inject 50 units ad breakfast , 70 units at lunch, and 60 units at dinner 12/05/20   Elayne Snare, MD  Methoxy PEG-Epoetin Beta (MIRCERA IJ) Mircera 08/15/20 08/14/21  [provider]  midodrine (PROAMATINE) 10 MG tablet Take 10 mg by mouth 3 (three) times daily. 09/27/20   [provider]  mupirocin ointment (BACTROBAN) 2 % Apply 1 application topically 2 (two) times daily. 01/21/21   McDonald, Stephan Minister, DPM  pantoprazole (PROTONIX) 40 MG tablet Take 1 tablet (40 mg total) by mouth daily. 01/17/21   Burnell Blanks, MD  predniSONE (DELTASONE) 50 MG tablet 1 tablet PO QD X4 days 01/14/21   Ripley Fraise, MD  sevelamer carbonate (RENVELA) 800 MG tablet Take 2,400-3,200 mg by  mouth in the morning, at noon, in the evening, and at bedtime. Takes 4 tablets with meals and 2 tablets with snacks 10/15/20   [provider]  silver sulfADIAZINE (SILVADENE) 1 % cream Apply 1 application topically daily. 02/03/21   Trula Slade, DPM  VELTASSA 8.4 g packet Take 8.4 g by mouth daily.  12/30/16   [provider]  warfarin (COUMADIN) 5 MG tablet TAKE 1 TABLET EVERY DAY OR AS DIRECTED BY COUMADIN CLINIC ADMIN INSTRUCTIONS 02/10/21   Hoyt Koch, MD    Allergies    Shellfish-derived products, Ace inhibitors, Hydrocodone, and Pork-derived products  Review of Systems   Review of Systems  Cardiovascular: Positive for chest pain.  All other systems reviewed and are negative.   Physical Exam Updated Vital Signs BP (!) 90/54 (BP Location: Right Arm)   Pulse (!) 108   Temp 98.4 F (36.9 C) (Oral)   Resp 20   Ht _0  (1.778 m)   Wt 130 kg   SpO2 99%   BMI 41.12 kg/m   Physical Exam Vitals and nursing note reviewed.  Constitutional:      Appearance: He is well-developed.  HENT:     Head: Normocephalic and atraumatic.  Cardiovascular:     Rate and Rhythm: Normal rate.  Pulmonary:     Effort: Pulmonary effort is normal. No respiratory distress.  Chest:     Chest wall: No tenderness.  Abdominal:     General: There is no distension.     Palpations: Abdomen is soft.  Musculoskeletal:        General: Normal range of motion.     Cervical back: Normal range of  motion.  Skin:    General: Skin is warm and dry.  Neurological:     Mental Status: He is alert.     ED Results / Procedures / Treatments   Labs (all labs ordered are listed, but only abnormal results are displayed) Labs Reviewed  RESP PANEL BY RT-PCR (FLU A&B, COVID) [id number]  BASIC METABOLIC PANEL  CBC  PROTIME-INR  TROPONIN I (HIGH SENSITIVITY)    EKG EKG Interpretation  Date/Time:  Thursday Feb 13 2021 22:28:35 EDT Ventricular Rate:  119 PR Interval:    QRS  Duration: 168 QT Interval:  387 QTC Calculation: 545 R Axis:   -74 Text Interpretation: Atrial flutter with predominant 2:1 AV block IVCD, consider atypical RBBB Abnrm T, consider ischemia, anterolateral lds Confirmed by Merrily Pew [phone]) on 02/13/2021 11:00:17 PM   Radiology DG Chest 2 View  Result Date: 02/13/2021 CLINICAL DATA:  Chest pain EXAM: CHEST - 2 VIEW COMPARISON:  Radiograph 01/16/2021, CT 12/13/2007 FINDINGS: Tunneled right IJ approach dual lumen dialysis catheter tip terminates in the superior cavoatrial junction. Vascular stenting noted in the right axillary tissues. Telemetry leads overlie the chest. There is diffuse pulmonary vascular congestion with hazy and fine interstitial opacities throughout both lungs in a mid to lower lung predominance. Some peripheral septal lines are present as well. Cardiomegaly is similar to comparison exams accounting for differences in technique. The aorta is calcified. The remaining cardiomediastinal contours are unremarkable. No acute osseous or soft tissue abnormality. IMPRESSION: Features suggestive of CHF/volume overload with cardiomegaly, vascular congestion and pulmonary edema. Aortic Atherosclerosis (ICD10-I70.0). Electronically Signed   By: Lovena Le M.D.   On: 02/13/2021 23:14    Procedures Procedures   Medications Ordered in ED Medications - No data to display  ED Course  I have reviewed the triage vital signs and the nursing notes.  Pertinent labs & imaging results that were available during my care of the patient were reviewed by me and considered in my medical decision making (see chart for details).    MDM Rules/Calculators/A&P                          D/w Dr. Carlis Abbott, will plan to change out catheter Friday sometime but also could be done as an outpatient. I feel like patient needs admission 2/2 hypotension, chest pain and to ensure this gets done so he can get dialysis since he has no other access for his scheduled  Saturday session. D/w medicine for admission. 2/2 soft pressures, they requested CCM consultation. D/w CCM, they will talk to medicine directly.   Final Clinical Impression(s) / ED Diagnoses Final diagnoses:  None    Rx / DC Orders ED Discharge Orders    None       Shashank Kwasnik, Corene Cornea, MD 02/14/21 1919

## 2021-02-14 ENCOUNTER — Inpatient Hospital Stay (HOSPITAL_COMMUNITY): Payer: Medicare HMO | Admitting: Anesthesiology

## 2021-02-14 ENCOUNTER — Inpatient Hospital Stay (HOSPITAL_COMMUNITY): Payer: Medicare HMO

## 2021-02-14 ENCOUNTER — Encounter (HOSPITAL_COMMUNITY): Payer: Self-pay | Admitting: Internal Medicine

## 2021-02-14 ENCOUNTER — Encounter (HOSPITAL_COMMUNITY)
Admission: EM | Disposition: A | Discharge status: Home / Self Care | Payer: Self-pay | Source: Home / Self Care | Attending: Internal Medicine

## 2021-02-14 DIAGNOSIS — I4892 Unspecified atrial flutter: Secondary | ICD-10-CM | POA: Diagnosis present

## 2021-02-14 DIAGNOSIS — I4819 Other persistent atrial fibrillation: Secondary | ICD-10-CM | POA: Diagnosis not present

## 2021-02-14 DIAGNOSIS — E875 Hyperkalemia: Secondary | ICD-10-CM | POA: Diagnosis not present

## 2021-02-14 DIAGNOSIS — Z20822 Contact with and (suspected) exposure to covid-19: Secondary | ICD-10-CM | POA: Diagnosis present

## 2021-02-14 DIAGNOSIS — T8241XA Breakdown (mechanical) of vascular dialysis catheter, initial encounter: Secondary | ICD-10-CM | POA: Diagnosis not present

## 2021-02-14 DIAGNOSIS — E1122 Type 2 diabetes mellitus with diabetic chronic kidney disease: Secondary | ICD-10-CM | POA: Diagnosis present

## 2021-02-14 DIAGNOSIS — I5022 Chronic systolic (congestive) heart failure: Secondary | ICD-10-CM | POA: Diagnosis present

## 2021-02-14 DIAGNOSIS — T82898A Other specified complication of vascular prosthetic devices, implants and grafts, initial encounter: Secondary | ICD-10-CM | POA: Diagnosis not present

## 2021-02-14 DIAGNOSIS — Z95828 Presence of other vascular implants and grafts: Secondary | ICD-10-CM | POA: Diagnosis not present

## 2021-02-14 DIAGNOSIS — Z888 Allergy status to other drugs, medicaments and biological substances status: Secondary | ICD-10-CM | POA: Diagnosis not present

## 2021-02-14 DIAGNOSIS — J449 Chronic obstructive pulmonary disease, unspecified: Secondary | ICD-10-CM | POA: Diagnosis present

## 2021-02-14 DIAGNOSIS — I132 Hypertensive heart and chronic kidney disease with heart failure and with stage 5 chronic kidney disease, or end stage renal disease: Secondary | ICD-10-CM | POA: Diagnosis present

## 2021-02-14 DIAGNOSIS — D631 Anemia in chronic kidney disease: Secondary | ICD-10-CM | POA: Diagnosis not present

## 2021-02-14 DIAGNOSIS — I4891 Unspecified atrial fibrillation: Secondary | ICD-10-CM | POA: Diagnosis present

## 2021-02-14 DIAGNOSIS — Z940 Kidney transplant status: Secondary | ICD-10-CM | POA: Diagnosis not present

## 2021-02-14 DIAGNOSIS — R079 Chest pain, unspecified: Secondary | ICD-10-CM | POA: Diagnosis present

## 2021-02-14 DIAGNOSIS — I5020 Unspecified systolic (congestive) heart failure: Secondary | ICD-10-CM | POA: Diagnosis not present

## 2021-02-14 DIAGNOSIS — D509 Iron deficiency anemia, unspecified: Secondary | ICD-10-CM | POA: Diagnosis present

## 2021-02-14 DIAGNOSIS — R778 Other specified abnormalities of plasma proteins: Secondary | ICD-10-CM

## 2021-02-14 DIAGNOSIS — Y838 Other surgical procedures as the cause of abnormal reaction of the patient, or of later complication, without mention of misadventure at the time of the procedure: Secondary | ICD-10-CM | POA: Diagnosis present

## 2021-02-14 DIAGNOSIS — T8249XA Other complication of vascular dialysis catheter, initial encounter: Secondary | ICD-10-CM | POA: Diagnosis present

## 2021-02-14 DIAGNOSIS — E1129 Type 2 diabetes mellitus with other diabetic kidney complication: Secondary | ICD-10-CM | POA: Diagnosis not present

## 2021-02-14 DIAGNOSIS — E1142 Type 2 diabetes mellitus with diabetic polyneuropathy: Secondary | ICD-10-CM | POA: Diagnosis present

## 2021-02-14 DIAGNOSIS — I214 Non-ST elevation (NSTEMI) myocardial infarction: Secondary | ICD-10-CM | POA: Diagnosis present

## 2021-02-14 DIAGNOSIS — Z955 Presence of coronary angioplasty implant and graft: Secondary | ICD-10-CM | POA: Diagnosis not present

## 2021-02-14 DIAGNOSIS — I255 Ischemic cardiomyopathy: Secondary | ICD-10-CM | POA: Diagnosis not present

## 2021-02-14 DIAGNOSIS — N186 End stage renal disease: Secondary | ICD-10-CM | POA: Diagnosis present

## 2021-02-14 DIAGNOSIS — N250 Renal osteodystrophy: Secondary | ICD-10-CM | POA: Diagnosis not present

## 2021-02-14 DIAGNOSIS — G4733 Obstructive sleep apnea (adult) (pediatric): Secondary | ICD-10-CM | POA: Diagnosis present

## 2021-02-14 DIAGNOSIS — Z992 Dependence on renal dialysis: Secondary | ICD-10-CM | POA: Diagnosis not present

## 2021-02-14 DIAGNOSIS — I517 Cardiomegaly: Secondary | ICD-10-CM | POA: Diagnosis not present

## 2021-02-14 DIAGNOSIS — J90 Pleural effusion, not elsewhere classified: Secondary | ICD-10-CM | POA: Diagnosis not present

## 2021-02-14 DIAGNOSIS — N2581 Secondary hyperparathyroidism of renal origin: Secondary | ICD-10-CM | POA: Diagnosis present

## 2021-02-14 DIAGNOSIS — I959 Hypotension, unspecified: Secondary | ICD-10-CM | POA: Diagnosis not present

## 2021-02-14 DIAGNOSIS — E785 Hyperlipidemia, unspecified: Secondary | ICD-10-CM | POA: Diagnosis present

## 2021-02-14 DIAGNOSIS — R0602 Shortness of breath: Secondary | ICD-10-CM | POA: Diagnosis not present

## 2021-02-14 DIAGNOSIS — J811 Chronic pulmonary edema: Secondary | ICD-10-CM | POA: Diagnosis not present

## 2021-02-14 DIAGNOSIS — Z6841 Body Mass Index (BMI) 40.0 and over, adult: Secondary | ICD-10-CM | POA: Diagnosis not present

## 2021-02-14 DIAGNOSIS — I2510 Atherosclerotic heart disease of native coronary artery without angina pectoris: Secondary | ICD-10-CM | POA: Diagnosis present

## 2021-02-14 DIAGNOSIS — I429 Cardiomyopathy, unspecified: Secondary | ICD-10-CM | POA: Diagnosis not present

## 2021-02-14 HISTORY — PX: EXCHANGE OF A DIALYSIS CATHETER: SHX5818

## 2021-02-14 LAB — POCT I-STAT, CHEM 8
BUN: 40 mg/dL — ABNORMAL HIGH (ref 6–20)
Calcium, Ion: 1.07 mmol/L — ABNORMAL LOW (ref 1.15–1.40)
Chloride: 100 mmol/L (ref 98–111)
Creatinine, Ser: 10.1 mg/dL — ABNORMAL HIGH (ref 0.61–1.24)
Glucose, Bld: 104 mg/dL — ABNORMAL HIGH (ref 70–99)
HCT: 37 % — ABNORMAL LOW (ref 39.0–52.0)
Hemoglobin: 12.6 g/dL — ABNORMAL LOW (ref 13.0–17.0)
Potassium: 5.1 mmol/L (ref 3.5–5.1)
Sodium: 138 mmol/L (ref 135–145)
TCO2: 30 mmol/L (ref 22–32)

## 2021-02-14 LAB — CBC
HCT: 38.9 % — ABNORMAL LOW (ref 39.0–52.0)
Hemoglobin: 11.2 g/dL — ABNORMAL LOW (ref 13.0–17.0)
MCH: 29.3 pg (ref 26.0–34.0)
MCHC: 28.8 g/dL — ABNORMAL LOW (ref 30.0–36.0)
MCV: 101.8 fL — ABNORMAL HIGH (ref 80.0–100.0)
Platelets: 219 10*3/uL (ref 150–400)
RBC: 3.82 MIL/uL — ABNORMAL LOW (ref 4.22–5.81)
RDW: 21.4 % — ABNORMAL HIGH (ref 11.5–15.5)
WBC: 7.4 10*3/uL (ref 4.0–10.5)
nRBC: 0.3 % — ABNORMAL HIGH (ref 0.0–0.2)

## 2021-02-14 LAB — PROTIME-INR
INR: 1.5 — ABNORMAL HIGH (ref 0.8–1.2)
Prothrombin Time: 18.4 seconds — ABNORMAL HIGH (ref 11.4–15.2)

## 2021-02-14 LAB — TROPONIN I (HIGH SENSITIVITY)
Troponin I (High Sensitivity): 576 ng/L (ref <18)
Troponin I (High Sensitivity): 922 ng/L (ref <18)
Troponin I (High Sensitivity): 1335 ng/L (ref <18)

## 2021-02-14 LAB — LIPID PANEL
Cholesterol: 79 mg/dL (ref 0–200)
HDL: 23 mg/dL — ABNORMAL LOW (ref >40)
LDL Cholesterol: 31 mg/dL (ref 0–99)
Total CHOL/HDL Ratio: 3.4 RATIO
Triglycerides: 125 mg/dL (ref <150)
VLDL: 25 mg/dL (ref 0–40)

## 2021-02-14 LAB — GLUCOSE, CAPILLARY
Glucose-Capillary: 94 mg/dL (ref 70–99)
Glucose-Capillary: 108 mg/dL — ABNORMAL HIGH (ref 70–99)
Glucose-Capillary: 97 mg/dL (ref 70–99)
Glucose-Capillary: 102 mg/dL — ABNORMAL HIGH (ref 70–99)
Glucose-Capillary: 89 mg/dL (ref 70–99)

## 2021-02-14 LAB — CREATININE, SERUM
Creatinine, Ser: 7.81 mg/dL — ABNORMAL HIGH (ref 0.61–1.24)
GFR, Estimated: 7 mL/min — ABNORMAL LOW (ref >60)

## 2021-02-14 SURGERY — EXCHANGE OF A DIALYSIS CATHETER
Anesthesia: General | Site: Neck | Laterality: Right

## 2021-02-14 MED ORDER — ROCURONIUM BROMIDE 10 MG/ML (PF) SYRINGE
PREFILLED_SYRINGE | INTRAVENOUS | Status: DC | PRN
  Administered 2021-02-14: 30 mg via INTRAVENOUS

## 2021-02-14 MED ORDER — FENTANYL CITRATE (PF) 100 MCG/2ML IJ SOLN
INTRAMUSCULAR | Status: AC
  Filled 2021-02-14: qty 2

## 2021-02-14 MED ORDER — ASPIRIN 300 MG RE SUPP: 300.0000 mg | RECTAL | Status: AC

## 2021-02-14 MED ORDER — LACTATED RINGERS IV BOLUS
500.0000 mL | Freq: Once | INTRAVENOUS | Status: AC
  Administered 2021-02-14: 500 mL via INTRAVENOUS

## 2021-02-14 MED ORDER — ASPIRIN EC 81 MG PO TBEC
81.0000 mg | DELAYED_RELEASE_TABLET | Freq: Every day | ORAL | Status: DC
  Administered 2021-02-15 – 2021-02-18 (×4): 81 mg via ORAL
  Filled 2021-02-14 (×4): qty 1

## 2021-02-14 MED ORDER — SUCCINYLCHOLINE CHLORIDE 200 MG/10ML IV SOSY
PREFILLED_SYRINGE | INTRAVENOUS | Status: DC | PRN
  Administered 2021-02-14: 160 mg via INTRAVENOUS

## 2021-02-14 MED ORDER — PROCHLORPERAZINE EDISYLATE 10 MG/2ML IJ SOLN
10.0000 mg | Freq: Four times a day (QID) | INTRAMUSCULAR | Status: DC | PRN
  Filled 2021-02-14: qty 2

## 2021-02-14 MED ORDER — CALCIUM CHLORIDE 10 % IV SOLN
INTRAVENOUS | Status: DC | PRN
  Administered 2021-02-14: 900 mg via INTRAVENOUS

## 2021-02-14 MED ORDER — WARFARIN SODIUM 7.5 MG PO TABS
7.5000 mg | ORAL_TABLET | Freq: Once | ORAL | Status: AC
  Administered 2021-02-14: 7.5 mg via ORAL
  Filled 2021-02-14: qty 1

## 2021-02-14 MED ORDER — MIDODRINE HCL 5 MG PO TABS
10.0000 mg | ORAL_TABLET | Freq: Three times a day (TID) | ORAL | Status: DC
  Administered 2021-02-14 – 2021-02-18 (×13): 10 mg via ORAL
  Filled 2021-02-14 (×13): qty 2

## 2021-02-14 MED ORDER — SODIUM CHLORIDE (PF) 0.9 % IJ SOLN
INTRAMUSCULAR | Status: DC | PRN
  Administered 2021-02-14: 1000 mL

## 2021-02-14 MED ORDER — ORAL CARE MOUTH RINSE: 15.0000 mL | Freq: Once | OROMUCOSAL | Status: AC

## 2021-02-14 MED ORDER — ATORVASTATIN CALCIUM 80 MG PO TABS
80.0000 mg | ORAL_TABLET | Freq: Every day | ORAL | Status: DC
  Administered 2021-02-14 – 2021-02-18 (×5): 80 mg via ORAL
  Filled 2021-02-14 (×5): qty 1

## 2021-02-14 MED ORDER — PHENYLEPHRINE 40 MCG/ML (10ML) SYRINGE FOR IV PUSH (FOR BLOOD PRESSURE SUPPORT)
PREFILLED_SYRINGE | INTRAVENOUS | Status: DC | PRN
  Administered 2021-02-14: 160 ug via INTRAVENOUS
  Administered 2021-02-14: 200 ug via INTRAVENOUS
  Administered 2021-02-14 (×2): 160 ug via INTRAVENOUS

## 2021-02-14 MED ORDER — HEPARIN SODIUM (PORCINE) 1000 UNIT/ML IJ SOLN
INTRAMUSCULAR | Status: DC | PRN
  Administered 2021-02-14: 3800 [IU]

## 2021-02-14 MED ORDER — LIDOCAINE-EPINEPHRINE 1 %-1:100000 IJ SOLN
INTRAMUSCULAR | Status: AC
  Filled 2021-02-14: qty 1

## 2021-02-14 MED ORDER — SODIUM CHLORIDE 0.9 % IV SOLN
INTRAVENOUS | Status: DC | PRN
  Administered 2021-02-14: 500 mL

## 2021-02-14 MED ORDER — MIDAZOLAM HCL 2 MG/2ML IJ SOLN
INTRAMUSCULAR | Status: AC
  Filled 2021-02-14: qty 2

## 2021-02-14 MED ORDER — VASOPRESSIN 20 UNIT/ML IV SOLN
INTRAVENOUS | Status: DC | PRN
  Administered 2021-02-14: 2 [IU] via INTRAVENOUS

## 2021-02-14 MED ORDER — ALBUMIN HUMAN 5 % IV SOLN
12.5000 g | Freq: Once | INTRAVENOUS | Status: AC
  Administered 2021-02-14: 12.5 g via INTRAVENOUS

## 2021-02-14 MED ORDER — NITROGLYCERIN 0.4 MG SL SUBL
0.4000 mg | SUBLINGUAL_TABLET | SUBLINGUAL | Status: DC | PRN
  Filled 2021-02-14: qty 1

## 2021-02-14 MED ORDER — INSULIN ASPART 100 UNIT/ML IJ SOLN
0.0000 [IU] | Freq: Every day | INTRAMUSCULAR | Status: DC
  Administered 2021-02-15: 2 [IU] via SUBCUTANEOUS

## 2021-02-14 MED ORDER — ACETAMINOPHEN 325 MG PO TABS
ORAL_TABLET | ORAL | Status: AC
  Administered 2021-02-14: 650 mg via ORAL
  Filled 2021-02-14: qty 2

## 2021-02-14 MED ORDER — HEPARIN SODIUM (PORCINE) 1000 UNIT/ML IJ SOLN
INTRAMUSCULAR | Status: AC
  Filled 2021-02-14: qty 4

## 2021-02-14 MED ORDER — PROPOFOL 10 MG/ML IV BOLUS
INTRAVENOUS | Status: DC | PRN
  Administered 2021-02-14: 180 mg via INTRAVENOUS

## 2021-02-14 MED ORDER — HEPARIN SODIUM (PORCINE) 1000 UNIT/ML IJ SOLN
INTRAMUSCULAR | Status: AC
  Filled 2021-02-14: qty 1

## 2021-02-14 MED ORDER — MELATONIN 3 MG PO TABS
3.0000 mg | ORAL_TABLET | Freq: Every evening | ORAL | Status: DC | PRN
  Filled 2021-02-14: qty 1

## 2021-02-14 MED ORDER — FENTANYL CITRATE (PF) 250 MCG/5ML IJ SOLN
INTRAMUSCULAR | Status: AC
  Filled 2021-02-14: qty 5

## 2021-02-14 MED ORDER — CHLORHEXIDINE GLUCONATE 0.12 % MT SOLN
OROMUCOSAL | Status: AC
  Administered 2021-02-14: 15 mL via OROMUCOSAL
  Filled 2021-02-14: qty 15

## 2021-02-14 MED ORDER — ALBUMIN HUMAN 5 % IV SOLN
INTRAVENOUS | Status: AC
  Filled 2021-02-14: qty 250

## 2021-02-14 MED ORDER — SODIUM CHLORIDE (PF) 0.9 % IJ SOLN
INTRAMUSCULAR | Status: AC
  Filled 2021-02-14: qty 10

## 2021-02-14 MED ORDER — CHLORHEXIDINE GLUCONATE 0.12 % MT SOLN: 15.0000 mL | Freq: Once | OROMUCOSAL | Status: AC

## 2021-02-14 MED ORDER — ACETAMINOPHEN 325 MG PO TABS
650.0000 mg | ORAL_TABLET | ORAL | Status: DC | PRN
  Administered 2021-02-14 – 2021-02-18 (×6): 650 mg via ORAL
  Filled 2021-02-14 (×6): qty 2

## 2021-02-14 MED ORDER — SODIUM CHLORIDE 0.9 % IV SOLN: INTRAVENOUS | Status: DC

## 2021-02-14 MED ORDER — SODIUM CHLORIDE 0.9 % IV SOLN
INTRAVENOUS | Status: AC
  Filled 2021-02-14: qty 1.2

## 2021-02-14 MED ORDER — CLONAZEPAM 0.5 MG PO TABS
0.5000 mg | ORAL_TABLET | Freq: Two times a day (BID) | ORAL | Status: DC
  Administered 2021-02-14 – 2021-02-17 (×6): 0.5 mg via ORAL
  Filled 2021-02-14 (×8): qty 1

## 2021-02-14 MED ORDER — ASPIRIN 81 MG PO CHEW: 324.0000 mg | CHEWABLE_TABLET | ORAL | Status: AC

## 2021-02-14 MED ORDER — SODIUM CHLORIDE 0.9 % IV SOLN: INTRAVENOUS | Status: DC | PRN

## 2021-02-14 MED ORDER — FENTANYL CITRATE (PF) 250 MCG/5ML IJ SOLN
INTRAMUSCULAR | Status: DC | PRN
  Administered 2021-02-14: 50 ug via INTRAVENOUS

## 2021-02-14 MED ORDER — HEPARIN SODIUM (PORCINE) 5000 UNIT/ML IJ SOLN
5000.0000 [IU] | Freq: Three times a day (TID) | INTRAMUSCULAR | Status: DC
  Administered 2021-02-14 – 2021-02-15 (×3): 5000 [IU] via SUBCUTANEOUS
  Filled 2021-02-14 (×3): qty 1

## 2021-02-14 MED ORDER — FENTANYL CITRATE (PF) 100 MCG/2ML IJ SOLN
25.0000 ug | INTRAMUSCULAR | Status: DC | PRN
  Administered 2021-02-14: 25 ug via INTRAVENOUS

## 2021-02-14 MED ORDER — WARFARIN - PHARMACIST DOSING INPATIENT: Freq: Every day | Status: DC

## 2021-02-14 MED ORDER — SUGAMMADEX SODIUM 200 MG/2ML IV SOLN
INTRAVENOUS | Status: DC | PRN
  Administered 2021-02-14 (×2): 200 mg via INTRAVENOUS

## 2021-02-14 MED ORDER — VASOPRESSIN 20 UNIT/ML IV SOLN
INTRAVENOUS | Status: AC
  Filled 2021-02-14: qty 1

## 2021-02-14 MED ORDER — INSULIN ASPART 100 UNIT/ML IJ SOLN
0.0000 [IU] | Freq: Three times a day (TID) | INTRAMUSCULAR | Status: DC
  Administered 2021-02-15 – 2021-02-16 (×3): 1 [IU] via SUBCUTANEOUS
  Administered 2021-02-18: 4 [IU] via SUBCUTANEOUS

## 2021-02-14 SURGICAL SUPPLY — 32 items
BAG DECANTER FOR FLEXI CONT (MISCELLANEOUS) ×2 IMPLANT
BIOPATCH RED 1 DISK 7.0 (GAUZE/BANDAGES/DRESSINGS) ×2 IMPLANT
CATH PALINDROME-P 23CM W/VT (CATHETERS) ×2 IMPLANT
COVER PROBE W GEL 5X96 (DRAPES) ×2 IMPLANT
COVER SURGICAL LIGHT HANDLE (MISCELLANEOUS) ×2 IMPLANT
DRAPE C-ARM 42X72 X-RAY (DRAPES) ×2 IMPLANT
DRAPE CHEST BREAST 15X10 FENES (DRAPES) ×2 IMPLANT
GAUZE 4X4 16PLY RFD (DISPOSABLE) ×2 IMPLANT
GLOVE BIOGEL PI IND STRL 7.5 (GLOVE) ×1 IMPLANT
GLOVE BIOGEL PI INDICATOR 7.5 (GLOVE) ×1
GLOVE SURG SS PI 7.5 STRL IVOR (GLOVE) ×2 IMPLANT
GOWN STRL REUS W/ TWL LRG LVL3 (GOWN DISPOSABLE) ×2 IMPLANT
GOWN STRL REUS W/ TWL XL LVL3 (GOWN DISPOSABLE) ×1 IMPLANT
GOWN STRL REUS W/TWL LRG LVL3 (GOWN DISPOSABLE) ×4
GOWN STRL REUS W/TWL XL LVL3 (GOWN DISPOSABLE) ×2
KIT BASIN OR (CUSTOM PROCEDURE TRAY) ×2 IMPLANT
KIT TURNOVER KIT B (KITS) ×2 IMPLANT
NEEDLE 18GX1X1/2 (RX/OR ONLY) (NEEDLE) ×2 IMPLANT
NS IRRIG 1000ML POUR BTL (IV SOLUTION) ×2 IMPLANT
PACK SURGICAL SETUP 50X90 (CUSTOM PROCEDURE TRAY) ×2 IMPLANT
PAD ARMBOARD 7.5X6 YLW CONV (MISCELLANEOUS) ×4 IMPLANT
SOAP 2 % CHG 4 OZ (WOUND CARE) ×2 IMPLANT
SUT ETHILON 3 0 PS 1 (SUTURE) ×2 IMPLANT
SUT VICRYL 4-0 PS2 18IN ABS (SUTURE) ×2 IMPLANT
SYR 10ML LL (SYRINGE) ×2 IMPLANT
SYR 20ML LL LF (SYRINGE) ×4 IMPLANT
SYR 30ML LL (SYRINGE) IMPLANT
SYR 5ML LL (SYRINGE) ×2 IMPLANT
SYR CONTROL 10ML LL (SYRINGE) ×2 IMPLANT
TOWEL GREEN STERILE (TOWEL DISPOSABLE) ×2 IMPLANT
WATER STERILE IRR 1000ML POUR (IV SOLUTION) ×2 IMPLANT
WIRE ROSEN 145CM (WIRE) ×2 IMPLANT

## 2021-02-14 NOTE — Anesthesia Preprocedure Evaluation (Signed)
Anesthesia Evaluation  Patient identified by MRN, date of birth, ID band Patient awake    Reviewed: Allergy & Precautions, NPO status , Patient's Chart, lab work & pertinent test results  Airway Mallampati: III  TM Distance: >3 FB     Dental  (+) Partial Lower, Partial Upper   Pulmonary     + decreased breath sounds      Cardiovascular hypertension,  Rhythm:Regular Rate:Normal     Neuro/Psych    GI/Hepatic   Endo/Other  diabetes  Renal/GU      Musculoskeletal   Abdominal (+) + obese,   Peds  Hematology   Anesthesia Other Findings   Reproductive/Obstetrics                             Anesthesia Physical Anesthesia Plan  ASA: III  Anesthesia Plan: General   Post-op Pain Management:    Induction: Intravenous  PONV Risk Score and Plan: Ondansetron and Dexamethasone  Airway Management Planned: Oral ETT  Additional Equipment:   Intra-op Plan:   Post-operative Plan: Extubation in OR  Informed Consent: I have reviewed the patients History and Physical, chart, labs and discussed the procedure including the risks, benefits and alternatives for the proposed anesthesia with the patient or authorized representative who has indicated his/her understanding and acceptance.     Dental advisory given  Plan Discussed with: CRNA and Anesthesiologist  Anesthesia Plan Comments:         Anesthesia Quick Evaluation

## 2021-02-14 NOTE — ED Notes (Signed)
Attempted to call report

## 2021-02-14 NOTE — Progress Notes (Signed)
   02/14/21 0446  Assess: MEWS Score  Temp 97.7 F (36.5 C)  BP (!) 93/51  Pulse Rate (!) 114  ECG Heart Rate (!) 114  Resp 18  SpO2 94 %  O2 Device Room Air  Assess: MEWS Score  MEWS Temp 0  MEWS Systolic 1  MEWS Pulse 2  MEWS RR 0  MEWS LOC 0  MEWS Score 3  MEWS Score Color Yellow  Assess: if the MEWS score is Yellow or Red  Were vital signs taken at a resting state? Yes  Focused Assessment No change from prior assessment  Early Detection of Sepsis Score *See Row Information* Low  MEWS guidelines implemented *See Row Information* Yes  Treat  MEWS Interventions Escalated (See documentation below)  Take Vital Signs  Increase Vital Sign Frequency  Yellow: Q 2hr X 2 then Q 4hr X 2, if remains yellow, continue Q 4hrs  Escalate  MEWS: Escalate Yellow: discuss with charge nurse/RN and consider discussing with provider and RRT  Notify: Charge Nurse/RN  Name of Charge Nurse/RN Notified Shanell RN  Date Charge Nurse/RN Notified 02/14/21  Time Charge Nurse/RN Notified 0525  Document  Patient Outcome Stabilized after interventions  Progress note created (see row info) Yes

## 2021-02-14 NOTE — Consult Note (Signed)
Hospital Consult    Reason for Consult: Nonfunctioning right IJ tunneled dialysis catheter Referring Physician: ED MRN #:  [id number]  History of Present Illness: This is a 55 y.o. male with multiple medical comorbidities as noted below including end-stage renal disease that vascular surgery is consulted for nonfunctioning right IJ tunneled dialysis catheter.  Patient arrived to the ED from dialysis after he completed only 30 minutes.  In addition states he was having chest pain that prompted his visit as well.  Apparently he states they tried to do medicine in his catheter as well and that was unsuccessful and it still did not work.  He has had failed bilateral upper extremity access and is well-known to our practice.  Most recently on 1/278/2022 had attempt at right upper extremity HERO graft.  This thrombosed POD#1 and Dr. Stanford Breed placed a right IJ tunneled dialysis catheter.  He states the catheter has worked since Dr. Stanford Breed placed it until today.  He is on coumadin but INR 1.6.  Past Medical History:  Diagnosis Date  . Allergic rhinitis   . Allergy   . Anemia   . Aortic insufficiency    SBE in 2016 >> pt declined AVR // a. Echo 7/16:  Mod LVH, EF 50-55%, mild to mod AI, severe LAE, PASP 57 mmHg (LV ID end diastolic 10.6 mm);  b. Echo 2/17:  Mod LVH, EF 50-55%, mod AI, MAC, mild MR, mod LAE, mild to mod TR, PASP 63 mmHg // Echo 2/21: normal EF, mild MR, mild AI  . Atrial fibrillation and flutter ([place])    chronic anticoag with Warfarin // bradycardia noted during admit 10/21  . Bilateral carpal tunnel syndrome   . Bradycardia    brady/low BP event during admit in 10/21 >> poor candidate for pacer/EPS due to high risk of infection with ESRD; pacer not recommended by EP  . CAD (coronary artery disease)    s/p NSTEMI 10/21: cath>> pLAD 99 >> tx with DES; Lat RI 100 and OM1 100 - med Rx  . Cataract    removed both eyes   . Cholecystitis    dx during admx 10/21 >> HIDA neg // exam benign //  high risk for surgery >> no surgical intervention recommended.   . Claustrophobia   . COPD (chronic obstructive pulmonary disease) ([place])   . Coronary artery disease   . Diabetes mellitus    Type II  . Diabetic peripheral neuropathy ([place])   . Diabetic retinopathy ([place])   . Discitis of lumbar region    resolved  . DVT (deep venous thrombosis) ([place]) 10/07/2015   LLE  . Dysrhythmia 07/2020   afib  . Endocarditis 11/28/14  . ESRD (end stage renal disease) on dialysis [facility])    s/p renal transplant in 2009, failed in 2016  . Gait disorder   . Gastroparesis   . GERD (gastroesophageal reflux disease)   . Gout   . Hearing difficulty   . Hemodialysis patient ([place])    tuesday, thursday, saturday   . HFrEF (HF with reduced EF)    Ischemic CM // Echo 10/21: EF 40-45, dist sept and apical AK, mild LVH, normal RVSF, mod MAC  . Hyperlipidemia   . Hypertension   . Incomplete bladder emptying   . Leg pain 02/05/11   with walking  . Morbid obesity ([place])   . Myocardial infarction ([place]) 07/2020  . Obstructive sleep apnea    not using CPAP- lost 100lbs  . Osteomyelitis ([place])   .  Posttraumatic stress disorder   . Rotator cuff disorder   . Secondary hyperparathyroidism ([place])   . Sepsis ([place])    Postive blood cultures -   . Sleep apnea    no cpap  . Urethral stricture   . Vitamin D deficiency     Past Surgical History:  Procedure Laterality Date  . ARTERIOVENOUS GRAFT PLACEMENT Bilateral "several"  . AV FISTULA PLACEMENT Bilateral 2015  . AV FISTULA PLACEMENT Right 06/13/2019   Procedure: INSERTION OF ARTERIOVENOUS (AV) GORE-TEX GRAFT RIGHT  ARM;  Surgeon: Elam Dutch, MD;  Location: [place];  Service: Vascular;  Laterality: Right;  . [place] REMOVAL Right 11/08/2020   Procedure: Excision of Foreign Body;  Surgeon: [name], MD;  Location: [place];  Service: Vascular;  Laterality: Right;  . BASCILIC VEIN TRANSPOSITION Right 09/27/2015  . [place] Right 09/27/2015    Procedure: BASILIC VEIN TRANSPOSITION right;  Surgeon: Mal Misty, MD;  Location: [place];  Service: Vascular;  Laterality: Right;  . CARDIAC CATHETERIZATION  07/2020  . CATARACT EXTRACTION W/ INTRAOCULAR LENS  IMPLANT, BILATERAL Bilateral   . CORONARY STENT INTERVENTION N/A 07/27/2020   Procedure: CORONARY STENT INTERVENTION;  Surgeon: Burnell Blanks, MD;  Location: [place] [facility];  Service: Cardiovascular;  Laterality: N/A;  . CYSTOSCOPY W/ INTERNAL URETHROTOMY  09/2014  . EYE SURGERY Bilateral 2006  . INSERTION OF DIALYSIS CATHETER N/A 11/08/2020   Procedure: INSERTION OF DIALYSIS CATHETER;  Surgeon: [name], MD;  Location: [place];  Service: Vascular;  Laterality: N/A;  . IR THROMBECTOMY AV FISTULA W/THROMBOLYSIS/PTA INC/SHUNT/IMG RIGHT Right 08/01/2019  . IR US GUIDE VASC ACCESS RIGHT  08/01/2019  . KIDNEY TRANSPLANT  2009  . LEFT HEART CATH AND CORONARY ANGIOGRAPHY N/A 07/27/2020   Procedure: LEFT HEART CATH AND CORONARY ANGIOGRAPHY;  Surgeon: Burnell Blanks, MD;  Location: [place] [facility];  Service: Cardiovascular;  Laterality: N/A;  . SIGMOIDOSCOPY    . UPPER EXTREMITY VENOGRAPHY Bilateral 06/02/2019   Procedure: UPPER EXTREMITY VENOGRAPHY;  Surgeon: Elam Dutch, MD;  Location: [place] [facility];  Service: Cardiovascular;  Laterality: Bilateral;  . UPPER GASTROINTESTINAL ENDOSCOPY    . VASCULAR ACCESS DEVICE INSERTION Right 11/07/2020   Procedure: RIGHT UPPER EXTREMITY HERO GRAFT;  Surgeon: [name], MD;  Location: [place];  Service: Vascular;  Laterality: Right;    Allergies  Allergen Reactions  . Shellfish-Derived Products Anaphylaxis  . Ace Inhibitors Cough  . Hydrocodone Other (See Comments)    Hallucinations  . Pork-Derived Products Other (See Comments)    Entry determined to be clinically insignificant: OK TO GIVE HEPARIN (has tolerated well in the past)  Patient had allergic episode in 1989 after he ate pork at a fair. He  developed fever and rash and went to the ED, but he did not have breathing issues. Received heparin in the past. Patient ok with removing pork allergy and listing as intolerance instead.    Prior to Admission medications   Medication Sig Start Date End Date Taking? Authorizing Provider  Accu-Chek FastClix Lancets MISC USE TO CHECK BLOOD SUGARS 4 TIMES A DAY. 08/21/20   Elayne Snare, MD  allopurinol (ZYLOPRIM) 100 MG tablet Take 1 tablet (100 mg total) by mouth 3 (three) times a week. After dialysis Patient taking differently: Take 100 mg by mouth every Tuesday, Thursday, and Saturday at 6 PM. After dialysis 05/17/19   Hoyt Koch, MD  atorvastatin (LIPITOR) 80 MG tablet Take 1  tablet (80 mg total) by mouth daily. 01/17/21 01/17/22  Burnell Blanks, MD  AURYXIA 1 GM 210 MG(Fe) tablet Take 630 mg by mouth 3 (three) times daily with meals. Takes 3 tablets with meal and 2 tablet with snacks 11/08/19   [provider]  busPIRone (BUSPAR) 5 MG tablet TAKE 1 TABLET TWICE DAILY AS NEEDED Patient taking differently: Take 5 mg by mouth 2 (two) times daily. 01/14/21   Hoyt Koch, MD  cephALEXin (KEFLEX) 500 MG capsule Take 1 capsule (500 mg total) by mouth 2 (two) times daily. 02/03/21   Trula Slade, DPM  clonazePAM (KLONOPIN) 0.5 MG tablet Use 1-2 tablets prior to dialysis 3 times weekly. Patient taking differently: Take 0.5 mg by mouth 2 (two) times daily. 10/29/20   Hoyt Koch, MD  clopidogrel (PLAVIX) 75 MG tablet Take 1 tablet (75 mg total) by mouth daily with breakfast. 09/26/20   Burnell Blanks, MD  diphenhydrAMINE (BENADRYL) 25 MG tablet Take 1 tablet (25 mg total) by mouth every 8 (eight) hours as needed for itching. 01/14/21   Ripley Fraise, MD  glucose blood (ACCU-CHEK GUIDE) test strip USE AS INSTRUCTED TO CHECK BLOOD SUGAR 3 TIMES DAILY AS NEEDED 08/21/20   Elayne Snare, MD  insulin glargine, 2 Unit Dial, (TOUJEO MAX SOLOSTAR) 300 UNIT/ML  Solostar Pen INJECT 80 UNITS UNDER THE SKIN IN THE MORNING AND 80 UNITS IN THE EVENING Patient taking differently: Inject 80 Units into the skin 2 (two) times daily. 12/05/20   Elayne Snare, MD  Insulin Pen Needle (DROPLET PEN NEEDLES) 32G X 4 MM MISC USE TO INJECT FIVE TIMES DAILY AS DIRECTED 02/10/21   Elayne Snare, MD  Insulin Pen Needle 32G X 8 MM MISC Use with insulin x5 times a day Patient taking differently: 1 each by Other route See admin instructions. Use with insulin x5 times a day 08/16/19   Elayne Snare, MD  insulin regular human CONCENTRATED (HUMULIN R U-500 KWIKPEN) 500 UNIT/ML kwikpen Inject 50-70 Units into the skin See admin instructions. Inject 50 units ad breakfast , 70 units at lunch, and 60 units at dinner 12/05/20   Elayne Snare, MD  Methoxy PEG-Epoetin Beta (MIRCERA IJ) Mircera 08/15/20 08/14/21  [provider]  midodrine (PROAMATINE) 10 MG tablet Take 10 mg by mouth 3 (three) times daily. 09/27/20   [provider]  mupirocin ointment (BACTROBAN) 2 % Apply 1 application topically 2 (two) times daily. 01/21/21   McDonald, Stephan Minister, DPM  pantoprazole (PROTONIX) 40 MG tablet Take 1 tablet (40 mg total) by mouth daily. 01/17/21   Burnell Blanks, MD  predniSONE (DELTASONE) 50 MG tablet 1 tablet PO QD X4 days 01/14/21   Ripley Fraise, MD  sevelamer carbonate (RENVELA) 800 MG tablet Take 2,400-3,200 mg by mouth in the morning, at noon, in the evening, and at bedtime. Takes 4 tablets with meals and 2 tablets with snacks 10/15/20   [provider]  silver sulfADIAZINE (SILVADENE) 1 % cream Apply 1 application topically daily. 02/03/21   Trula Slade, DPM  VELTASSA 8.4 g packet Take 8.4 g by mouth daily.  12/30/16   [provider]  warfarin (COUMADIN) 5 MG tablet TAKE 1 TABLET EVERY DAY OR AS DIRECTED BY COUMADIN CLINIC ADMIN INSTRUCTIONS 02/10/21   Hoyt Koch, MD    Social History   Socioeconomic History  . Marital status: Divorced     Spouse name: Not on file  . Number of children: 0  .  Years of education: College   . Highest education level: Bachelor's degree (e.g., BA, AB, BS)  Occupational History    Employer: UNEMPLOYED  Tobacco Use  . Smoking status: Never Smoker  . Smokeless tobacco: Never Used  Vaping Use  . Vaping Use: Never used  Substance and Sexual Activity  . Alcohol use: No    Alcohol/week: 0.0 standard drinks  . Drug use: No  . Sexual activity: Not Currently  Other Topics Concern  . Not on file  Social History Narrative   Patient is Divorced.   Patient does not have any children.   Patient is right-handed   Patient is currently in college working on his Ph.D.      Social Determinants of Health   Financial Resource Strain: Not on file  Food Insecurity: Not on file  Transportation Needs: Not on file  Physical Activity: Not on file  Stress: Not on file  Social Connections: Not on file  Intimate Partner Violence: Not on file     Family History  Problem Relation Age of Onset  . Hypertension Mother   . Diabetes Mother   . Diabetes Father   . Diabetes Brother   . Colon cancer Neg Hx   . Colon polyps Neg Hx   . Esophageal cancer Neg Hx   . Rectal cancer Neg Hx   . Stomach cancer Neg Hx     ROS: _0  Positive   _1  Negative   _2  All sytems reviewed and are negative  Cardiovascular: _3  chest pain/pressure _4  palpitations _5  SOB lying flat _6  DOE _7  pain in legs while walking _8  pain in legs at rest _9  pain in legs at night _10  non-healing ulcers _11  hx of DVT _12  swelling in legs  Pulmonary: _13  productive cough _14  asthma/wheezing _15  home O2  Neurologic: _16  weakness in _17  arms _18  legs _19  numbness in _20  arms _21  legs _22  hx of CVA _23  mini stroke _24 difficulty speaking or slurred speech _25  temporary loss of vision in one eye _26  dizziness  Hematologic: _27  hx of cancer _28  bleeding problems _29  problems with blood clotting easily  Endocrine:   _30  diabetes _31  thyroid  disease  GI _32  vomiting blood _33  blood in stool  GU: _34  CKD/renal failure _35  HD--_36  M/W/F or _37  T/T/S _38  burning with urination _39  blood in urine  Psychiatric: _40  anxiety _41  depression  Musculoskeletal: _42  arthritis _43  joint pain  Integumentary: _44  rashes _45  ulcers  Constitutional: _46  fever _47  chills   Physical Examination  Vitals:   02/13/21 2345 02/14/21 0000  BP: (!) 90/33 (!) 93/28  Pulse: (!) 58 (!) 117  Resp: (!) 22 16  Temp:    SpO2: 92% 93%   Body mass index is 41.12 kg/m.  General:  NAD Gait: Not observed HENT: WNL, normocephalic Pulmonary: normal non-labored breathing Abdomen: soft, NT/ND Vascular Exam/Pulses: Right IJ tunneled dialysis cath Failed bilateral upper extremity access Extremities: without ischemic changes Musculoskeletal: no muscle wasting or atrophy  Neurologic: A&O X 3; Appropriate Affect ; SENSATION: normal; MOTOR FUNCTION:  moving all extremities equally. Speech is fluent/normal   CBC    Component Value Date/Time   WBC 8.2 02/13/2021 2240   RBC 3.91 (L) 02/13/2021 2240   HGB 11.4 (L) 02/13/2021 2240   HCT 39.9 02/13/2021 2240   PLT 223 02/13/2021 2240   MCV 102.0 (H) 02/13/2021 2240   MCH 29.2 02/13/2021 2240   MCHC 28.6 (L) 02/13/2021 2240   RDW 21.8 (H) 02/13/2021 2240  LYMPHSABS 0.5 (L) 01/14/2021 0856   MONOABS 0.1 01/14/2021 0856   EOSABS 0.0 01/14/2021 0856   BASOSABS 0.0 01/14/2021 0856    BMET    Component Value Date/Time   NA 137 02/13/2021 2240   K 4.7 02/13/2021 2240   CL 97 (L) 02/13/2021 2240   CO2 32 02/13/2021 2240   GLUCOSE 298 (H) 02/13/2021 2240   BUN 36 (H) 02/13/2021 2240   CREATININE 7.70 (H) 02/13/2021 2240   CALCIUM 8.6 (L) 02/13/2021 2240   GFRNONAA 8 (L) 02/13/2021 2240   GFRAA 4 (L) 08/01/2019 0746    COAGS: Lab Results  Component Value Date   INR 1.6 (H) 02/13/2021   INR 2.0 (H) 01/18/2021   INR 2.1 (H) 01/17/2021   PROTIME 28.2 (A) 02/16/2019   PROTIME 26.1 (A)  06/18/2017   PROTIME 23.6 (A) 06/10/2017     Non-Invasive Vascular Imaging:    None   ASSESSMENT/PLAN: This is a 55 y.o. male with multiple medical committees including ESRD that presents to the ED with nonfunctioning right IJ tunneled dialysis catheter after previous failed bilateral upper extremity access including recent attempt at right arm HERO graft..  Discussed that I will add him on to the OR schedule tomorrow and will likely be in the afternoon for exchange of his right internal jugular vein tunneled dialysis catheter given he has no functioning access at this time.  He will need to be n.p.o. after midnight.  Consent order placed in chart.  I asked the ED to send a COVID test.  Separately he presented with chest pain/hypotension and this is being worked up by the ED and will need to be resolved before proceeding to the OR.     Marty Heck, MD [facility] Office: [place]

## 2021-02-14 NOTE — Plan of Care (Signed)

## 2021-02-14 NOTE — ED Notes (Signed)
EDP notified on patients persistent hypotension.

## 2021-02-14 NOTE — Anesthesia Procedure Notes (Signed)
Procedure Name: Intubation Date/Time: 02/14/2021 2:15 PM Performed by: Thelma Comp, CRNA Pre-anesthesia Checklist: Patient identified, Emergency Drugs available, Suction available and Patient being monitored Patient Re-evaluated:Patient Re-evaluated prior to induction Oxygen Delivery Method: Circle System Utilized Preoxygenation: Pre-oxygenation with 100% oxygen Induction Type: IV induction, Rapid sequence and Cricoid Pressure applied Laryngoscope Size: Glidescope and 4 Grade View: Grade I Tube type: Oral Tube size: 7.5 mm Number of attempts: 1 Airway Equipment and Method: Stylet Placement Confirmation: ETT inserted through vocal cords under direct vision,  positive ETCO2 and breath sounds checked- equal and bilateral Secured at: 23 cm Tube secured with: Tape Dental Injury: Teeth and Oropharynx as per pre-operative assessment

## 2021-02-14 NOTE — Consult Note (Signed)
Cardiology Consultation:   Patient ID: Armanie Martine MRN: [id number]; DOB: 11-Feb-1966  Admit date: 02/13/2021 Date of Consult: 02/14/2021  PCP:  Hoyt Koch, MD   [facility] Providers Cardiologist:  Lauree Chandler, MD   {      Patient Profile:   Miner Koral is a 55 y.o. male with a hx of ESRD on iHD, CAD s/p PCI 07/2020 who is being seen 02/14/2021 for the evaluation of elevated troponin at the request of Dr. Nevada Crane.  History of Present Illness:   Mr. Lotter was in his usual state of health until this afternoon when he went to dialysis. His line clotted off and he received medicine in his line to try and open it up. He developed some discomfort in his abdomen. Unfortunately, the medicine did not work and he was sent to the ER for further evaluation of his dialysis access. He feels back to baseline now and denies any further abdominal pain. He denies any chest pain. He recalls having true chest pain in October 2021 when he was admitted for a NSTEMI and received a stent to his LAD. He has not had anything else like this. He denies any shortness of breath, lightheadedness, dizziness, lower leg swelling, orthopnea or PND. His last full dialysis session was yesterday.    Past Medical History:  Diagnosis Date  . Allergic rhinitis   . Allergy   . Anemia   . Aortic insufficiency    SBE in 2016 >> pt declined AVR // a. Echo 7/16:  Mod LVH, EF 50-55%, mild to mod AI, severe LAE, PASP 57 mmHg (LV ID end diastolic 56.4 mm);  b. Echo 2/17:  Mod LVH, EF 50-55%, mod AI, MAC, mild MR, mod LAE, mild to mod TR, PASP 63 mmHg // Echo 2/21: normal EF, mild MR, mild AI  . Atrial fibrillation and flutter ([place])    chronic anticoag with Warfarin // bradycardia noted during admit 10/21  . Bilateral carpal tunnel syndrome   . Bradycardia    brady/low BP event during admit in 10/21 >> poor candidate for pacer/EPS due to high risk of infection with ESRD; pacer not recommended by EP  . CAD  (coronary artery disease)    s/p NSTEMI 10/21: cath>> pLAD 99 >> tx with DES; Lat RI 100 and OM1 100 - med Rx  . Cataract    removed both eyes   . Cholecystitis    dx during admx 10/21 >> HIDA neg // exam benign // high risk for surgery >> no surgical intervention recommended.   . Claustrophobia   . COPD (chronic obstructive pulmonary disease) ([place])   . Coronary artery disease   . Diabetes mellitus    Type II  . Diabetic peripheral neuropathy ([place])   . Diabetic retinopathy ([place])   . Discitis of lumbar region    resolved  . DVT (deep venous thrombosis) ([place]) 10/07/2015   LLE  . Dysrhythmia 07/2020   afib  . Endocarditis 11/28/14  . ESRD (end stage renal disease) on dialysis [facility])    s/p renal transplant in 2009, failed in 2016  . Gait disorder   . Gastroparesis   . GERD (gastroesophageal reflux disease)   . Gout   . Hearing difficulty   . Hemodialysis patient ([place])    tuesday, thursday, saturday   . HFrEF (HF with reduced EF)    Ischemic CM // Echo 10/21: EF 40-45, dist sept and apical AK, mild LVH, normal RVSF, mod MAC  . Hyperlipidemia   .  Hypertension   . Incomplete bladder emptying   . Leg pain 02/05/11   with walking  . Morbid obesity ([place])   . Myocardial infarction ([place]) 07/2020  . Obstructive sleep apnea    not using CPAP- lost 100lbs  . Osteomyelitis ([place])   . Posttraumatic stress disorder   . Rotator cuff disorder   . Secondary hyperparathyroidism ([place])   . Sepsis ([place])    Postive blood cultures -   . Sleep apnea    no cpap  . Urethral stricture   . Vitamin D deficiency     Past Surgical History:  Procedure Laterality Date  . ARTERIOVENOUS GRAFT PLACEMENT Bilateral "several"  . AV FISTULA PLACEMENT Bilateral 2015  . AV FISTULA PLACEMENT Right 06/13/2019   Procedure: INSERTION OF ARTERIOVENOUS (AV) GORE-TEX GRAFT RIGHT  ARM;  Surgeon: Elam Dutch, MD;  Location: [place];  Service: Vascular;  Laterality: Right;  . [place] REMOVAL Right 11/08/2020    Procedure: Excision of Foreign Body;  Surgeon: [name], MD;  Location: [place];  Service: Vascular;  Laterality: Right;  . BASCILIC VEIN TRANSPOSITION Right 09/27/2015  . [place] Right 09/27/2015   Procedure: BASILIC VEIN TRANSPOSITION right;  Surgeon: Mal Misty, MD;  Location: [place];  Service: Vascular;  Laterality: Right;  . CARDIAC CATHETERIZATION  07/2020  . CATARACT EXTRACTION W/ INTRAOCULAR LENS  IMPLANT, BILATERAL Bilateral   . CORONARY STENT INTERVENTION N/A 07/27/2020   Procedure: CORONARY STENT INTERVENTION;  Surgeon: Burnell Blanks, MD;  Location: [place] [facility];  Service: Cardiovascular;  Laterality: N/A;  . CYSTOSCOPY W/ INTERNAL URETHROTOMY  09/2014  . EYE SURGERY Bilateral 2006  . INSERTION OF DIALYSIS CATHETER N/A 11/08/2020   Procedure: INSERTION OF DIALYSIS CATHETER;  Surgeon: [name], MD;  Location: [place];  Service: Vascular;  Laterality: N/A;  . IR THROMBECTOMY AV FISTULA W/THROMBOLYSIS/PTA INC/SHUNT/IMG RIGHT Right 08/01/2019  . IR US GUIDE VASC ACCESS RIGHT  08/01/2019  . KIDNEY TRANSPLANT  2009  . LEFT HEART CATH AND CORONARY ANGIOGRAPHY N/A 07/27/2020   Procedure: LEFT HEART CATH AND CORONARY ANGIOGRAPHY;  Surgeon: Burnell Blanks, MD;  Location: [place] [facility];  Service: Cardiovascular;  Laterality: N/A;  . SIGMOIDOSCOPY    . UPPER EXTREMITY VENOGRAPHY Bilateral 06/02/2019   Procedure: UPPER EXTREMITY VENOGRAPHY;  Surgeon: Elam Dutch, MD;  Location: [place] [facility];  Service: Cardiovascular;  Laterality: Bilateral;  . UPPER GASTROINTESTINAL ENDOSCOPY    . VASCULAR ACCESS DEVICE INSERTION Right 11/07/2020   Procedure: RIGHT UPPER EXTREMITY HERO GRAFT;  Surgeon: [name], MD;  Location: [facility] OR;  Service: Vascular;  Laterality: Right;     Home Medications:  Prior to Admission medications   Medication Sig Start Date End Date Taking? Authorizing Provider  Accu-Chek FastClix Lancets MISC USE  TO CHECK BLOOD SUGARS 4 TIMES A DAY. 08/21/20  Yes Elayne Snare, MD  allopurinol (ZYLOPRIM) 100 MG tablet Take 1 tablet (100 mg total) by mouth 3 (three) times a week. After dialysis Patient taking differently: Take 100 mg by mouth every Tuesday, Thursday, and Saturday at 6 PM. After dialysis 05/17/19  Yes Hoyt Koch, MD  atorvastatin (LIPITOR) 80 MG tablet Take 1 tablet (80 mg total) by mouth daily. 01/17/21 01/17/22 Yes Burnell Blanks, MD  AURYXIA 1 GM 210 MG(Fe) tablet Take 630 mg by mouth 3 (three) times daily with meals. Takes 3 tablets with meal and 2 tablet with snacks 11/08/19  Yes [provider]  busPIRone (BUSPAR) 5 MG tablet TAKE 1 TABLET TWICE DAILY AS NEEDED Patient taking differently: Take 5 mg by mouth 2 (two) times daily. 01/14/21  Yes Hoyt Koch, MD  clonazePAM (KLONOPIN) 0.5 MG tablet Use 1-2 tablets prior to dialysis 3 times weekly. Patient taking differently: Take 0.5 mg by mouth 2 (two) times daily. 10/29/20  Yes Hoyt Koch, MD  clopidogrel (PLAVIX) 75 MG tablet Take 1 tablet (75 mg total) by mouth daily with breakfast. 09/26/20  Yes Burnell Blanks, MD  diphenhydrAMINE (BENADRYL) 25 MG tablet Take 1 tablet (25 mg total) by mouth every 8 (eight) hours as needed for itching. 01/14/21  Yes Ripley Fraise, MD  glucose blood (ACCU-CHEK GUIDE) test strip USE AS INSTRUCTED TO CHECK BLOOD SUGAR 3 TIMES DAILY AS NEEDED 08/21/20  Yes Elayne Snare, MD  insulin glargine, 2 Unit Dial, (TOUJEO MAX SOLOSTAR) 300 UNIT/ML Solostar Pen INJECT 80 UNITS UNDER THE SKIN IN THE MORNING AND 80 UNITS IN THE EVENING Patient taking differently: Inject 80 Units into the skin 2 (two) times daily. 12/05/20  Yes Elayne Snare, MD  Insulin Pen Needle (DROPLET PEN NEEDLES) 32G X 4 MM MISC USE TO INJECT FIVE TIMES DAILY AS DIRECTED 02/10/21  Yes Elayne Snare, MD  Insulin Pen Needle 32G X 8 MM MISC Use with insulin x5 times a day Patient taking differently: 1 each by  Other route See admin instructions. Use with insulin x5 times a day 08/16/19  Yes Elayne Snare, MD  insulin regular human CONCENTRATED (HUMULIN R U-500 KWIKPEN) 500 UNIT/ML kwikpen Inject 50-70 Units into the skin See admin instructions. Inject 50 units ad breakfast , 70 units at lunch, and 60 units at dinner 12/05/20  Yes Elayne Snare, MD  midodrine (PROAMATINE) 10 MG tablet Take 10 mg by mouth 3 (three) times daily. 09/27/20  Yes [provider]  mupirocin ointment (BACTROBAN) 2 % Apply 1 application topically 2 (two) times daily. 01/21/21  Yes McDonald, Stephan Minister, DPM  pantoprazole (PROTONIX) 40 MG tablet Take 1 tablet (40 mg total) by mouth daily. 01/17/21  Yes Burnell Blanks, MD  sevelamer carbonate (RENVELA) 800 MG tablet Take 2,400-3,200 mg by mouth in the morning, at noon, in the evening, and at bedtime. Takes 4 tablets with meals and 2 tablets with snacks 10/15/20  Yes [provider]  silver sulfADIAZINE (SILVADENE) 1 % cream Apply 1 application topically daily. 02/03/21  Yes Trula Slade, DPM  VELTASSA 8.4 g packet Take 8.4 g by mouth daily.  12/30/16  Yes [provider]  warfarin (COUMADIN) 5 MG tablet TAKE 1 TABLET EVERY DAY OR AS DIRECTED BY COUMADIN CLINIC ADMIN INSTRUCTIONS Patient taking differently: Take 5 mg by mouth daily. 02/10/21  Yes Hoyt Koch, MD  cephALEXin (KEFLEX) 500 MG capsule Take 1 capsule (500 mg total) by mouth 2 (two) times daily. 02/03/21   Trula Slade, DPM  Methoxy PEG-Epoetin Beta (MIRCERA IJ) Mircera 08/15/20 08/14/21  [provider]  predniSONE (DELTASONE) 50 MG tablet 1 tablet PO QD X4 days 01/14/21   Ripley Fraise, MD    Inpatient Medications: Scheduled Meds: . aspirin  324 mg Oral NOW   Or  . aspirin  300 mg Rectal NOW  . [START ON 02/15/2021] aspirin EC  81 mg Oral Daily  . atorvastatin  80 mg Oral Daily  . heparin  5,000 Units Subcutaneous Q8H  . insulin aspart  0-5 Units Subcutaneous QHS  .  insulin aspart  0-6  Units Subcutaneous TID WC  . midodrine  10 mg Oral TID with meals   Continuous Infusions:  PRN Meds: acetaminophen, melatonin, nitroGLYCERIN, prochlorperazine  Allergies:    Allergies  Allergen Reactions  . Shellfish-Derived Products Anaphylaxis  . Ace Inhibitors Cough  . Hydrocodone Other (See Comments)    Hallucinations  . Pork-Derived Products Other (See Comments)    Entry determined to be clinically insignificant: OK TO GIVE HEPARIN (has tolerated well in the past)  Patient had allergic episode in 1989 after he ate pork at a fair. He developed fever and rash and went to the ED, but he did not have breathing issues. Received heparin in the past. Patient ok with removing pork allergy and listing as intolerance instead.    Social History:   Social History   Socioeconomic History  . Marital status: Divorced    Spouse name: Not on file  . Number of children: 0  . Years of education: College   . Highest education level: Bachelor's degree (e.g., BA, AB, BS)  Occupational History    Employer: UNEMPLOYED  Tobacco Use  . Smoking status: Never Smoker  . Smokeless tobacco: Never Used  Vaping Use  . Vaping Use: Never used  Substance and Sexual Activity  . Alcohol use: No    Alcohol/week: 0.0 standard drinks  . Drug use: No  . Sexual activity: Not Currently  Other Topics Concern  . Not on file  Social History Narrative   Patient is Divorced.   Patient does not have any children.   Patient is right-handed   Patient is currently in college working on his Ph.D.      Social Determinants of Health   Financial Resource Strain: Not on file  Food Insecurity: Not on file  Transportation Needs: Not on file  Physical Activity: Not on file  Stress: Not on file  Social Connections: Not on file  Intimate Partner Violence: Not on file    Family History:    Family History  Problem Relation Age of Onset  . Hypertension Mother   . Diabetes Mother   .  Diabetes Father   . Diabetes Brother   . Colon cancer Neg Hx   . Colon polyps Neg Hx   . Esophageal cancer Neg Hx   . Rectal cancer Neg Hx   . Stomach cancer Neg Hx      ROS:  Please see the history of present illness.  All other ROS reviewed and negative.     Physical Exam/Data:   Vitals:   02/14/21 0200 02/14/21 0230 02/14/21 0316 02/14/21 0345  BP: 102/75 (!) 115/100 (!) 92/47 (!) 120/93  Pulse: (!) 118 (!) 111 (!) 108   Resp: (!) 27 (!) _0 Temp:      TempSrc:      SpO2: 92% 94% 99%   Weight:      Height:        Intake/Output Summary (Last 24 hours) at 02/14/2021 0413 Last data filed at 02/14/2021 0202 Gross per 24 hour  Intake 1000 ml  Output 0 ml  Net 1000 ml   Last 3 Weights 02/13/2021 01/18/2021 01/18/2021  Weight (lbs) 286 lb 9.6 oz 292 lb 12.3 oz 296 lb 11.8 oz  Weight (kg) 130 kg 132.8 kg 134.6 kg  Some encounter information is confidential and restricted. Go to Review Flowsheets activity to see all data.     Body mass index is 41.12 kg/m.  General:  Well nourished, well developed, in no  acute distress, obese HEENT: normal Neck: no JVD Cardiac:  Tachycardic, regular rhythm, no murmurs, rubs or gallops Lungs:  clear to auscultation bilaterally, no wheezing, rhonchi or rales  Abd: soft, nontender, no hepatomegaly  Ext: trace lower extremity edema Musculoskeletal:  No deformities, BUE and BLE strength normal and equal Skin: warm and dry  Neuro:  CNs 2-12 intact, no focal abnormalities noted Psych:  Normal affect   EKG:  The EKG was personally reviewed and demonstrates:  Sinus tachycardic, atypical RBBB, similar to prior   Relevant CV Studies: LHC 07/2020  Lat Ramus lesion is 100% stenosed.  1st Mrg lesion is 100% stenosed.  Prox LAD lesion is 99% stenosed.  A drug-eluting stent was successfully placed using a STENT RESOLUTE ONYX 3.5X18.  Post intervention, there is a 0% residual stenosis.   1. Severe proximal to mid LAD stenosis 2. Chronic  occlusion of the distal intermediate branch, small in caliber 3. Complete occlusion of the distal segment of the first obtuse marginal branch. Cannot exclude that this was the infarct vessel but the patient was pain free and this vessel did not appear large enough for PCI. There is collateral filling of lateral wall branches from the RCA 4. Successful PTCA/DES x 1 proximal to mid LAD   Laboratory Data:  High Sensitivity Troponin:   Recent Labs  Lab 02/13/21 2240 02/14/21 0024  TROPONINIHS 572* 576*     Chemistry Recent Labs  Lab 02/13/21 2240  NA 137  K 4.7  CL 97*  CO2 32  GLUCOSE 298*  BUN 36*  CREATININE 7.70*  CALCIUM 8.6*  GFRNONAA 8*  ANIONGAP 8    No results for input(s): PROT, ALBUMIN, AST, ALT, ALKPHOS, BILITOT in the last 168 hours. Hematology Recent Labs  Lab 02/13/21 2240 02/14/21 0328  WBC 8.2 7.4  RBC 3.91* 3.82*  HGB 11.4* 11.2*  HCT 39.9 38.9*  MCV 102.0* 101.8*  MCH 29.2 29.3  MCHC 28.6* 28.8*  RDW 21.8* 21.4*  PLT 223 219   BNPNo results for input(s): BNP, PROBNP in the last 168 hours.  DDimer No results for input(s): DDIMER in the last 168 hours.   Radiology/Studies:  DG Chest 2 View  Result Date: 02/13/2021 CLINICAL DATA:  Chest pain EXAM: CHEST - 2 VIEW COMPARISON:  Radiograph 01/16/2021, CT 12/13/2007 FINDINGS: Tunneled right IJ approach dual lumen dialysis catheter tip terminates in the superior cavoatrial junction. Vascular stenting noted in the right axillary tissues. Telemetry leads overlie the chest. There is diffuse pulmonary vascular congestion with hazy and fine interstitial opacities throughout both lungs in a mid to lower lung predominance. Some peripheral septal lines are present as well. Cardiomegaly is similar to comparison exams accounting for differences in technique. The aorta is calcified. The remaining cardiomediastinal contours are unremarkable. No acute osseous or soft tissue abnormality. IMPRESSION: Features suggestive of  CHF/volume overload with cardiomegaly, vascular congestion and pulmonary edema. Aortic Atherosclerosis (ICD10-I70.0). Electronically Signed   By: Lovena Le M.D.   On: 02/13/2021 23:14     Assessment and Plan:   1. Elevated troponin- presents to the ER due to difficulties with his dialysis catheter. He is otherwise asymptomatic and distinctly denies any chest pain or shortness of breath. He has known CAD with recent [place] in October 2021. His troponin is elevated but flat as it has been in the past. Suspect he has chronic small vessel disease leading to his chronically elevated troponin. Given his lack of symptoms or ECG changes, no further work-up or treatment  is necessary.   Risk Assessment/Risk Scores:       For questions or updates, please contact [place] Please consult [email or web address] for contact info under    Signed, Princella Pellegrini, MD  02/14/2021 4:13 AM

## 2021-02-14 NOTE — Progress Notes (Signed)
Patient stated that he does not wear CPAP at home and will not be wearing it while at the hospital .

## 2021-02-14 NOTE — Op Note (Signed)
    Patient name: Tracey Stewart MRN: [id number] DOB: 1965-11-24 Sex: male  02/14/2021 Pre-operative Diagnosis: End-stage renal disease Post-operative diagnosis:  Same Surgeon:  Annamarie Major Procedure:   #1: Removal of existing tunneled dialysis catheter   #2: Placement of a new 23 cm tunneled dialysis catheter Anesthesia: General Blood Loss: 50 cc Specimens: None  Findings: Catheter tip at cavoatrial junction  Indications: The patient has a right-sided dialysis catheter which is not functioning.  He comes in today for new catheter.  Procedure:  The patient was identified in the holding area and taken to [place] [address]  The patient was then placed supine on the table. general anesthesia was administered.  The patient was prepped and draped in the usual sterile fashion.  A time out was called and antibiotics were administered.  A skin nick was made above the clavicle.  Through this incision I dissected out the existing tunneled catheter and clamped it with a hemostat.  Attention was then turned towards the catheter exit site.  The cuff was freed up from the subcutaneous tissue.  I then transected the catheter proximal to the cuff and brought the catheter out the neck incision.  A Rosen wire was then inserted into the inferior vena cava and the old catheter was removed and a peel-away sheath was placed.  Next, a new skin exit site was selected.  A skin nick was made with an 11 blade.  A subcutaneous tunneler was used to connect the 2 incisions.  A 23 cm catheter was brought through the tunnel and then fed into the sheath and situated at the cavoatrial junction.  Both ports flushed and aspirated without difficulty.  The catheter was sutured into position with 2-0 nylon and the neck incision was closed with 4-0 Vicryl.  There were no complications.   Disposition: To PACU stable.   Theotis Burrow, M.D., [facility] [place] Office: [phone] Pager:  [phone]

## 2021-02-14 NOTE — H&P (Signed)
History and Physical  [name] [name] IWL:[id number] DOB: 1966/03/30 DOA: 02/13/2021  Referring physician: DR, Dolly Rias, [place] PCP: Hoyt Koch, MD  Outpatient Specialists: Nephrology, vascular surgery. Patient coming from: Home.  Chief Complaint: Chest pain after hemodialysis  HPI: [name] [name] is a 55 y.o. male with medical history significant for ESRD on HD TTS, prior renal transplantation that has since failed, multiple upper extremity AV access procedures that occluded.  He had a tunneled hemodialysis catheter placed which appeared to be malfunctioning on the day of presentation, he could not complete his hemodialysis on 02/13/2021.  He was sent home.  While laying down on his bed around 4 PM started having right-sided chest pain, pressure-like, 10 out of 10 in severity, radiating to the left side of his chest.  Felt like he could not breathe.  Lasting 1 to 2 hours.  No worsening or alleviating factors.  Similar to previous chest pain but more intense and lasted longer.  Called EMS.  Upon presentation to the ED work-up revealed elevated troponin greater than 500, hypotension and tachycardia.  Vascular surgery was consulted by EDP with plan for hemodialysis cath exchange on 02/14/2021.  TRH, hospitalist team, was asked to admit.  ED Course:  Afebrile.  BP 82/69, pulse 113, respiratory rate 26, O2 saturation 99% on room air.  Lab studies remarkable for serum glucose 298, troponin 576 from 572.  Review of Systems: Review of systems as noted in the HPI. All other systems reviewed and are negative.   Past Medical History:  Diagnosis Date  . Allergic rhinitis   . Allergy   . Anemia   . Aortic insufficiency    SBE in 2016 >> pt declined AVR // a. Echo 7/16:  Mod LVH, EF 50-55%, mild to mod AI, severe LAE, PASP 57 mmHg (LV ID end diastolic 17.4 mm);  b. Echo 2/17:  Mod LVH, EF 50-55%, mod AI, MAC, mild MR, mod LAE, mild to mod TR, PASP 63 mmHg // Echo 2/21: normal EF, mild MR, mild AI  .  Atrial fibrillation and flutter ([place])    chronic anticoag with Warfarin // bradycardia noted during admit 10/21  . Bilateral carpal tunnel syndrome   . Bradycardia    brady/low BP event during admit in 10/21 >> poor candidate for pacer/EPS due to high risk of infection with ESRD; pacer not recommended by EP  . CAD (coronary artery disease)    s/p NSTEMI 10/21: cath>> pLAD 99 >> tx with DES; Lat RI 100 and OM1 100 - med Rx  . Cataract    removed both eyes   . Cholecystitis    dx during admx 10/21 >> HIDA neg // exam benign // high risk for surgery >> no surgical intervention recommended.   . Claustrophobia   . COPD (chronic obstructive pulmonary disease) ([place])   . Coronary artery disease   . Diabetes mellitus    Type II  . Diabetic peripheral neuropathy ([place])   . Diabetic retinopathy ([place])   . Discitis of lumbar region    resolved  . DVT (deep venous thrombosis) ([place]) 10/07/2015   LLE  . Dysrhythmia 07/2020   afib  . Endocarditis 11/28/14  . ESRD (end stage renal disease) on dialysis [facility])    s/p renal transplant in 2009, failed in 2016  . Gait disorder   . Gastroparesis   . GERD (gastroesophageal reflux disease)   . Gout   . Hearing difficulty   . Hemodialysis patient ([place])    tuesday, thursday,  saturday   . HFrEF (HF with reduced EF)    Ischemic CM // Echo 10/21: EF 40-45, dist sept and apical AK, mild LVH, normal RVSF, mod MAC  . Hyperlipidemia   . Hypertension   . Incomplete bladder emptying   . Leg pain 02/05/11   with walking  . Morbid obesity ([place])   . Myocardial infarction ([place]) 07/2020  . Obstructive sleep apnea    not using CPAP- lost 100lbs  . Osteomyelitis ([place])   . Posttraumatic stress disorder   . Rotator cuff disorder   . Secondary hyperparathyroidism ([place])   . Sepsis ([employer])    Postive blood cultures -   . Sleep apnea    no cpap  . Urethral stricture   . Vitamin D deficiency    Past Surgical History:  Procedure Laterality Date  . ARTERIOVENOUS  GRAFT PLACEMENT Bilateral "several"  . AV FISTULA PLACEMENT Bilateral 2015  . AV FISTULA PLACEMENT Right 06/13/2019   Procedure: INSERTION OF ARTERIOVENOUS (AV) GORE-TEX GRAFT RIGHT  ARM;  Surgeon: Elam Dutch, MD;  Location: [place];  Service: Vascular;  Laterality: Right;  . [place] REMOVAL Right 11/08/2020   Procedure: Excision of Foreign Body;  Surgeon: [name], MD;  Location: [place];  Service: Vascular;  Laterality: Right;  . BASCILIC VEIN TRANSPOSITION Right 09/27/2015  . [place] Right 09/27/2015   Procedure: BASILIC VEIN TRANSPOSITION right;  Surgeon: Mal Misty, MD;  Location: [place];  Service: Vascular;  Laterality: Right;  . CARDIAC CATHETERIZATION  07/2020  . CATARACT EXTRACTION W/ INTRAOCULAR LENS  IMPLANT, BILATERAL Bilateral   . CORONARY STENT INTERVENTION N/A 07/27/2020   Procedure: CORONARY STENT INTERVENTION;  Surgeon: Burnell Blanks, MD;  Location: [place] [facility];  Service: Cardiovascular;  Laterality: N/A;  . CYSTOSCOPY W/ INTERNAL URETHROTOMY  09/2014  . EYE SURGERY Bilateral 2006  . INSERTION OF DIALYSIS CATHETER N/A 11/08/2020   Procedure: INSERTION OF DIALYSIS CATHETER;  Surgeon: [name], MD;  Location: [place];  Service: Vascular;  Laterality: N/A;  . IR THROMBECTOMY AV FISTULA W/THROMBOLYSIS/PTA INC/SHUNT/IMG RIGHT Right 08/01/2019  . IR US GUIDE VASC ACCESS RIGHT  08/01/2019  . KIDNEY TRANSPLANT  2009  . LEFT HEART CATH AND CORONARY ANGIOGRAPHY N/A 07/27/2020   Procedure: LEFT HEART CATH AND CORONARY ANGIOGRAPHY;  Surgeon: Burnell Blanks, MD;  Location: [place] [facility];  Service: Cardiovascular;  Laterality: N/A;  . SIGMOIDOSCOPY    . UPPER EXTREMITY VENOGRAPHY Bilateral 06/02/2019   Procedure: UPPER EXTREMITY VENOGRAPHY;  Surgeon: Elam Dutch, MD;  Location: [facility];  Service: Cardiovascular;  Laterality: Bilateral;  . UPPER GASTROINTESTINAL ENDOSCOPY    . VASCULAR ACCESS DEVICE INSERTION  Right 11/07/2020   Procedure: RIGHT UPPER EXTREMITY HERO GRAFT;  Surgeon: [name], MD;  Location: [place];  Service: Vascular;  Laterality: Right;    Social History:  reports that he has never smoked. He has never used smokeless tobacco. He reports that he does not drink alcohol and does not use drugs.   Allergies  Allergen Reactions  . Shellfish-Derived Products Anaphylaxis  . Ace Inhibitors Cough  . Hydrocodone Other (See Comments)    Hallucinations  . Pork-Derived Products Other (See Comments)    Entry determined to be clinically insignificant: OK TO GIVE HEPARIN (has tolerated well in the past)  Patient had allergic episode in 1989 after he ate pork at a fair. He developed fever and rash and went to the ED, but  he did not have breathing issues. Received heparin in the past. Patient ok with removing pork allergy and listing as intolerance instead.    Family History  Problem Relation Age of Onset  . Hypertension Mother   . Diabetes Mother   . Diabetes Father   . Diabetes Brother   . Colon cancer Neg Hx   . Colon polyps Neg Hx   . Esophageal cancer Neg Hx   . Rectal cancer Neg Hx   . Stomach cancer Neg Hx       Prior to Admission medications   Medication Sig Start Date End Date Taking? Authorizing Provider  Accu-Chek FastClix Lancets MISC USE TO CHECK BLOOD SUGARS 4 TIMES A DAY. 08/21/20  Yes Elayne Snare, MD  allopurinol (ZYLOPRIM) 100 MG tablet Take 1 tablet (100 mg total) by mouth 3 (three) times a week. After dialysis Patient taking differently: Take 100 mg by mouth every Tuesday, Thursday, and Saturday at 6 PM. After dialysis 05/17/19  Yes Hoyt Koch, MD  atorvastatin (LIPITOR) 80 MG tablet Take 1 tablet (80 mg total) by mouth daily. 01/17/21 01/17/22 Yes Burnell Blanks, MD  AURYXIA 1 GM 210 MG(Fe) tablet Take 630 mg by mouth 3 (three) times daily with meals. Takes 3 tablets with meal and 2 tablet with snacks 11/08/19  Yes [provider]   busPIRone (BUSPAR) 5 MG tablet TAKE 1 TABLET TWICE DAILY AS NEEDED Patient taking differently: Take 5 mg by mouth 2 (two) times daily. 01/14/21  Yes Hoyt Koch, MD  clonazePAM (KLONOPIN) 0.5 MG tablet Use 1-2 tablets prior to dialysis 3 times weekly. Patient taking differently: Take 0.5 mg by mouth 2 (two) times daily. 10/29/20  Yes Hoyt Koch, MD  clopidogrel (PLAVIX) 75 MG tablet Take 1 tablet (75 mg total) by mouth daily with breakfast. 09/26/20  Yes Burnell Blanks, MD  diphenhydrAMINE (BENADRYL) 25 MG tablet Take 1 tablet (25 mg total) by mouth every 8 (eight) hours as needed for itching. 01/14/21  Yes Ripley Fraise, MD  glucose blood (ACCU-CHEK GUIDE) test strip USE AS INSTRUCTED TO CHECK BLOOD SUGAR 3 TIMES DAILY AS NEEDED 08/21/20  Yes Elayne Snare, MD  insulin glargine, 2 Unit Dial, (TOUJEO MAX SOLOSTAR) 300 UNIT/ML Solostar Pen INJECT 80 UNITS UNDER THE SKIN IN THE MORNING AND 80 UNITS IN THE EVENING Patient taking differently: Inject 80 Units into the skin 2 (two) times daily. 12/05/20  Yes Elayne Snare, MD  Insulin Pen Needle (DROPLET PEN NEEDLES) 32G X 4 MM MISC USE TO INJECT FIVE TIMES DAILY AS DIRECTED 02/10/21  Yes Elayne Snare, MD  Insulin Pen Needle 32G X 8 MM MISC Use with insulin x5 times a day Patient taking differently: 1 each by Other route See admin instructions. Use with insulin x5 times a day 08/16/19  Yes Elayne Snare, MD  insulin regular human CONCENTRATED (HUMULIN R U-500 KWIKPEN) 500 UNIT/ML kwikpen Inject 50-70 Units into the skin See admin instructions. Inject 50 units ad breakfast , 70 units at lunch, and 60 units at dinner 12/05/20  Yes Elayne Snare, MD  midodrine (PROAMATINE) 10 MG tablet Take 10 mg by mouth 3 (three) times daily. 09/27/20  Yes [provider]  mupirocin ointment (BACTROBAN) 2 % Apply 1 application topically 2 (two) times daily. 01/21/21  Yes McDonald, Stephan Minister, DPM  pantoprazole (PROTONIX) 40 MG tablet Take 1 tablet (40 mg  total) by mouth daily. 01/17/21  Yes Burnell Blanks, MD  sevelamer carbonate (RENVELA) 800 MG  tablet Take 2,400-3,200 mg by mouth in the morning, at noon, in the evening, and at bedtime. Takes 4 tablets with meals and 2 tablets with snacks 10/15/20  Yes [provider]  silver sulfADIAZINE (SILVADENE) 1 % cream Apply 1 application topically daily. 02/03/21  Yes Trula Slade, DPM  VELTASSA 8.4 g packet Take 8.4 g by mouth daily.  12/30/16  Yes [provider]  warfarin (COUMADIN) 5 MG tablet TAKE 1 TABLET EVERY DAY OR AS DIRECTED BY COUMADIN CLINIC ADMIN INSTRUCTIONS Patient taking differently: Take 5 mg by mouth daily. 02/10/21  Yes Hoyt Koch, MD  cephALEXin (KEFLEX) 500 MG capsule Take 1 capsule (500 mg total) by mouth 2 (two) times daily. 02/03/21   Trula Slade, DPM  Methoxy PEG-Epoetin Beta (MIRCERA IJ) Mircera 08/15/20 08/14/21  [provider]  predniSONE (DELTASONE) 50 MG tablet 1 tablet PO QD X4 days 01/14/21   Ripley Fraise, MD    Physical Exam: BP (!) 92/47 (BP Location: Right Arm)   Pulse (!) 108   Temp 98.4 F (36.9 C) (Oral)   Resp 20   Ht _0  (1.778 m)   Wt 130 kg   SpO2 99%   BMI 41.12 kg/m   . General: 55 y.o. year-old male well developed well nourished in no acute distress.  Alert and oriented x3. . Cardiovascular: Regular rate and rhythm with no rubs or gallops.  No thyromegaly or JVD noted.  Trace lower extremity edema. 2/4 pulses in all 4 extremities. Marland Kitchen Respiratory: Clear to auscultation with no wheezes or rales. Good inspiratory effort. . Abdomen: Soft nontender nondistended with normal bowel sounds x4 quadrants. . Muskuloskeletal: No cyanosis or clubbing.  Trace lower extremity edema bilaterally. . Neuro: CN II-XII intact, strength, sensation, reflexes . Skin: No ulcerative lesions noted or rashes . Psychiatry: Judgement and insight appear normal. Mood is appropriate for condition and setting          Labs on  Admission:  Basic Metabolic Panel: Recent Labs  Lab 02/13/21 2240  NA 137  K 4.7  CL 97*  CO2 32  GLUCOSE 298*  BUN 36*  CREATININE 7.70*  CALCIUM 8.6*   Liver Function Tests: No results for input(s): AST, ALT, ALKPHOS, BILITOT, PROT, ALBUMIN in the last 168 hours. No results for input(s): LIPASE, AMYLASE in the last 168 hours. No results for input(s): AMMONIA in the last 168 hours. CBC: Recent Labs  Lab 02/13/21 2240  WBC 8.2  HGB 11.4*  HCT 39.9  MCV 102.0*  PLT 223   Cardiac Enzymes: No results for input(s): CKTOTAL, CKMB, CKMBINDEX, TROPONINI in the last 168 hours.  BNP (last 3 results) No results for input(s): BNP in the last 8760 hours.  ProBNP (last 3 results) No results for input(s): PROBNP in the last 8760 hours.  CBG: No results for input(s): GLUCAP in the last 168 hours.  Radiological Exams on Admission: DG Chest 2 View  Result Date: 02/13/2021 CLINICAL DATA:  Chest pain EXAM: CHEST - 2 VIEW COMPARISON:  Radiograph 01/16/2021, CT 12/13/2007 FINDINGS: Tunneled right IJ approach dual lumen dialysis catheter tip terminates in the superior cavoatrial junction. Vascular stenting noted in the right axillary tissues. Telemetry leads overlie the chest. There is diffuse pulmonary vascular congestion with hazy and fine interstitial opacities throughout both lungs in a mid to lower lung predominance. Some peripheral septal lines are present as well. Cardiomegaly is similar to comparison exams accounting for differences in technique. The aorta is calcified. The remaining  cardiomediastinal contours are unremarkable. No acute osseous or soft tissue abnormality. IMPRESSION: Features suggestive of CHF/volume overload with cardiomegaly, vascular congestion and pulmonary edema. Aortic Atherosclerosis (ICD10-I70.0). Electronically Signed   By: Lovena Le M.D.   On: 02/13/2021 23:14    EKG: I independently viewed the EKG done and my findings are as followed: A flutter rate of  112, nonspecific ST-T changes.  QTc 542.  Assessment/Plan Present on Admission: . Chest pain  Active Problems:   Chest pain  Atypical chest pain, rule out ACS. Presented to the ED due to chest pain, pressure-like, severe 10 out of 10, from right side of chest radiating to the left side.  Associated with dyspnea.  Lasting an hour.  No worsening or alleviating factors. Troponin greater than 500, up-trending 576 Repeat troponin and twelve-lead EKG Closely monitor on telemetry Obtain 2D echo, fasting lipid panel Daily aspirin 81 mg, high intensity statin Cardiology consult  Hemodialysis tunneled catheter malfunction Vascular surgery consulted by [place] for exchange on 02/14/2021  ESRD on HD TTS History of prior renal transplantation that has since failed, multiple upper extremity AV access procedures that demonstrated occlusion.   Tunneled hemodialysis catheter placed which appeared to be malfunctioning, could not complete his hemodialysis on 02/13/2021. Nephrology consulted  Type 2 diabetes with hyperglycemia Last hemoglobin A1c 7.2 on 01/14/2021 Start insulin sliding scale  Chronic hypotension on midodrine Restart home midodrine Maintain MAP greater than 65  Prolonged QTC Optimize magnesium and potassium level with hemodialysis Avoid QTC prolonging agents Repeat twelve-lead EKG  HFREF 40-45% Last 2D echo done on 07/29/2020 showed LVEF 40 to 45%, the left ventricle demonstrated regional wall motion abnormalities. Follow 2D echo ordered on 02/14/2021 Volume status and electrolytes managed with hemodialysis  A-flutter on Coumadin Subtherapeutic on Coumadin Presented with INR 1.6 Pharmacy to assist with dosing Not on rate control agent due to chronic hypotension Monitor on telemetry.  Subtherapeutic INR Pharmacy consulted for dosing Appreciate pharmacy's assistance Repeat INR in the morning  Coronary artery disease/history of cardiac arrest a month ago Closely monitor on  telemetry Management per cardiology Aspirin, high intensity statin  Anemia of chronic disease Hemoglobin stable 11.2 No overt bleeding  OSA Resume CPAP nightly   DVT prophylaxis: Coumadin  Code Status: Full code as stated by the patient himself.  Family Communication: None at bedside.  Disposition Plan: Admit to progressive unit.  Consults called: Cardiology, vascular surgery, nephrology.  Admission status: Inpatient status.  Patient will require at least 2 midnights for further evaluation and treatment of present condition.   Status is: Inpatient    Dispo:  Patient From: Home  Planned Disposition: Home, possibly on 02/16/2021 or when cardiology and vascular surgery sign off.  Medically stable for discharge: No, ongoing management of chest pain rule out ACS, elevated troponin, hemodialysis tunnel catheter malfunction.         Kayleen Memos MD [facility] Pager [phone]  If 7PM-7AM, please contact night-coverage [email or web address] Password [facility]  02/14/2021, 3:38 AM

## 2021-02-14 NOTE — Progress Notes (Signed)
Patient BP reading inaccurate. Anesthesia at the bedside dopplering BP with manual cuff and SBP is at least 100. Okay to transport back to unit.   Gabriel Rainwater, RN

## 2021-02-14 NOTE — Progress Notes (Signed)
Patient agitated this morning and does not want to answer any admission history questions. Will pass on to day shift nurse.

## 2021-02-14 NOTE — Consult Note (Signed)
[facility] Renal Consultation Note    Indication for Consultation:  Management of ESRD/hemodialysis, anemia, hypertension/volume, and secondary hyperparathyroidism. PCP:  HPI: [name] [name] is a 55 y.o. male with ESRD, CAD s/p stent, chronic hypotension, A-fib (on warfarin), T2DM, HL, GERD who was admitted with CP and catheter dysfunction.  Presented to his outpatient HD on 02/13/21 and noted that his catheter was non-functional, instilled with tPA without success and also developed chest pain and therefore he was sent to the ED for evaluation. There, he was noted to be hypotensive (chronic). He was not hypoxic. Labs showed K 4.7, Trop 572 -> 576 -> 922 -> 1335. Cardiology consulted, no procedures planned at this time. VVS consulted for [facility] dysfunction and plan is for exchanged today. Per patient, it will be around 2pm  This morning, he was seen in room. Tired b/c didn't sleep much overnight. No CP at the moment. No dyspnea, fever, chills, nausea, vomiting, diarrhea.  Dialyzes on TTS schedule at [facility]. Last HD was 5/3, unable to be dialyzed on 5/5 due to catheter dysfunction.  Past Medical History:  Diagnosis Date  . Allergic rhinitis   . Allergy   . Anemia   . Aortic insufficiency    SBE in 2016 >> pt declined AVR // a. Echo 7/16:  Mod LVH, EF 50-55%, mild to mod AI, severe LAE, PASP 57 mmHg (LV ID end diastolic 26.7 mm);  b. Echo 2/17:  Mod LVH, EF 50-55%, mod AI, MAC, mild MR, mod LAE, mild to mod TR, PASP 63 mmHg // Echo 2/21: normal EF, mild MR, mild AI  . Atrial fibrillation and flutter ([place])    chronic anticoag with Warfarin // bradycardia noted during admit 10/21  . Bilateral carpal tunnel syndrome   . Bradycardia    brady/low BP event during admit in 10/21 >> poor candidate for pacer/EPS due to high risk of infection with ESRD; pacer not recommended by EP  . CAD (coronary artery disease)    s/p NSTEMI 10/21: cath>> pLAD 99 >> tx with DES; Lat RI 100 and OM1 100 -  med Rx  . Cataract    removed both eyes   . Cholecystitis    dx during admx 10/21 >> HIDA neg // exam benign // high risk for surgery >> no surgical intervention recommended.   . Claustrophobia   . COPD (chronic obstructive pulmonary disease) ([place])   . Coronary artery disease   . Diabetes mellitus    Type II  . Diabetic peripheral neuropathy ([place])   . Diabetic retinopathy ([place])   . Discitis of lumbar region    resolved  . DVT (deep venous thrombosis) ([place]) 10/07/2015   LLE  . Dysrhythmia 07/2020   afib  . Endocarditis 11/28/14  . ESRD (end stage renal disease) on dialysis [facility])    s/p renal transplant in 2009, failed in 2016  . Gait disorder   . Gastroparesis   . GERD (gastroesophageal reflux disease)   . Gout   . Hearing difficulty   . Hemodialysis patient ([place])    tuesday, thursday, saturday   . HFrEF (HF with reduced EF)    Ischemic CM // Echo 10/21: EF 40-45, dist sept and apical AK, mild LVH, normal RVSF, mod MAC  . Hyperlipidemia   . Hypertension   . Incomplete bladder emptying   . Leg pain 02/05/11   with walking  . Morbid obesity ([place])   . Myocardial infarction ([place]) 07/2020  . Obstructive sleep apnea  not using CPAP- lost 100lbs  . Osteomyelitis ([place])   . Posttraumatic stress disorder   . Rotator cuff disorder   . Secondary hyperparathyroidism ([place])   . Sepsis ([place])    Postive blood cultures -   . Sleep apnea    no cpap  . Urethral stricture   . Vitamin D deficiency    Past Surgical History:  Procedure Laterality Date  . ARTERIOVENOUS GRAFT PLACEMENT Bilateral "several"  . AV FISTULA PLACEMENT Bilateral 2015  . AV FISTULA PLACEMENT Right 06/13/2019   Procedure: INSERTION OF ARTERIOVENOUS (AV) GORE-TEX GRAFT RIGHT  ARM;  Surgeon: Elam Dutch, MD;  Location: [place];  Service: Vascular;  Laterality: Right;  . [place] REMOVAL Right 11/08/2020   Procedure: Excision of Foreign Body;  Surgeon: [name], MD;  Location: [place];  Service: Vascular;   Laterality: Right;  . BASCILIC VEIN TRANSPOSITION Right 09/27/2015  . [place] Right 09/27/2015   Procedure: BASILIC VEIN TRANSPOSITION right;  Surgeon: Mal Misty, MD;  Location: [place];  Service: Vascular;  Laterality: Right;  . CARDIAC CATHETERIZATION  07/2020  . CATARACT EXTRACTION W/ INTRAOCULAR LENS  IMPLANT, BILATERAL Bilateral   . CORONARY STENT INTERVENTION N/A 07/27/2020   Procedure: CORONARY STENT INTERVENTION;  Surgeon: Burnell Blanks, MD;  Location: [place] CV LAB;  Service: Cardiovascular;  Laterality: N/A;  . CYSTOSCOPY W/ INTERNAL URETHROTOMY  09/2014  . EYE SURGERY Bilateral 2006  . INSERTION OF DIALYSIS CATHETER N/A 11/08/2020   Procedure: INSERTION OF DIALYSIS CATHETER;  Surgeon: [name], MD;  Location: [place];  Service: Vascular;  Laterality: N/A;  . IR THROMBECTOMY AV FISTULA W/THROMBOLYSIS/PTA INC/SHUNT/IMG RIGHT Right 08/01/2019  . IR US GUIDE VASC ACCESS RIGHT  08/01/2019  . KIDNEY TRANSPLANT  2009  . LEFT HEART CATH AND CORONARY ANGIOGRAPHY N/A 07/27/2020   Procedure: LEFT HEART CATH AND CORONARY ANGIOGRAPHY;  Surgeon: Burnell Blanks, MD;  Location: [place] CV LAB;  Service: Cardiovascular;  Laterality: N/A;  . SIGMOIDOSCOPY    . UPPER EXTREMITY VENOGRAPHY Bilateral 06/02/2019   Procedure: UPPER EXTREMITY VENOGRAPHY;  Surgeon: Elam Dutch, MD;  Location: [place] CV LAB;  Service: Cardiovascular;  Laterality: Bilateral;  . UPPER GASTROINTESTINAL ENDOSCOPY    . VASCULAR ACCESS DEVICE INSERTION Right 11/07/2020   Procedure: RIGHT UPPER EXTREMITY HERO GRAFT;  Surgeon: [name], MD;  Location: [facility] OR;  Service: Vascular;  Laterality: Right;   Family History  Problem Relation Age of Onset  . Hypertension Mother   . Diabetes Mother   . Diabetes Father   . Diabetes Brother   . Colon cancer Neg Hx   . Colon polyps Neg Hx   . Esophageal cancer Neg Hx   . Rectal cancer Neg Hx   . Stomach cancer Neg  Hx    Social History:  reports that he has never smoked. He has never used smokeless tobacco. He reports that he does not drink alcohol and does not use drugs.  ROS: As per HPI otherwise negative.  Physical Exam: Vitals:   02/14/21 0437 02/14/21 0446 02/14/21 0627 02/14/21 0801  BP:  (!) 93/51 (!) 98/55 (!) 96/53  Pulse:  (!) 114 (!) 107 (!) 106  Resp:  _0 Temp:  97.7 F (36.5 C) 98 F (36.7 C) 97.8 F (36.6 C)  TempSrc:  Oral Oral Oral  SpO2:  94% 100%   Weight: 134.9 kg     Height: _1  (1.778 m)  General: Well developed, well nourished, in no acute distress.  Head: Normocephalic, atraumatic, sclera non-icteric, mucus membranes are moist. Neck: Supple without lymphadenopathy/masses. JVD not elevated. Lungs: Clear bilaterally to auscultation without wheezes, rales, or rhonchi. Breathing is unlabored. Heart: RRR with normal S1, S2. No murmurs, rubs, or gallops appreciated. Abdomen: Soft, non-tender, non-distended with normoactive bowel sounds. No rebound/guarding. No obvious abdominal masses. Musculoskeletal:  Strength and tone appear normal for age. Lower extremities: No edema or ischemic changes, no open wounds. Neuro: Alert and oriented X 3. Moves all extremities spontaneously. Psych:  Responds to questions appropriately with a normal affect. Dialysis Access: TDC in R chest  Allergies  Allergen Reactions  . Shellfish-Derived Products Anaphylaxis  . Ace Inhibitors Cough  . Hydrocodone Other (See Comments)    Hallucinations  . Pork-Derived Products Other (See Comments)    Entry determined to be clinically insignificant: OK TO GIVE HEPARIN (has tolerated well in the past)  Patient had allergic episode in 1989 after he ate pork at a fair. He developed fever and rash and went to the ED, but he did not have breathing issues. Received heparin in the past. Patient ok with removing pork allergy and listing as intolerance instead.   Prior to Admission medications    Medication Sig Start Date End Date Taking? Authorizing Provider  Accu-Chek FastClix Lancets MISC USE TO CHECK BLOOD SUGARS 4 TIMES A DAY. 08/21/20  Yes Elayne Snare, MD  allopurinol (ZYLOPRIM) 100 MG tablet Take 1 tablet (100 mg total) by mouth 3 (three) times a week. After dialysis Patient taking differently: Take 100 mg by mouth every Tuesday, Thursday, and Saturday at 6 PM. After dialysis 05/17/19  Yes Hoyt Koch, MD  atorvastatin (LIPITOR) 80 MG tablet Take 1 tablet (80 mg total) by mouth daily. 01/17/21 01/17/22 Yes Burnell Blanks, MD  AURYXIA 1 GM 210 MG(Fe) tablet Take 630 mg by mouth 3 (three) times daily with meals. Takes 3 tablets with meal and 2 tablet with snacks 11/08/19  Yes [provider]  busPIRone (BUSPAR) 5 MG tablet TAKE 1 TABLET TWICE DAILY AS NEEDED Patient taking differently: Take 5 mg by mouth 2 (two) times daily. 01/14/21  Yes Hoyt Koch, MD  clonazePAM (KLONOPIN) 0.5 MG tablet Use 1-2 tablets prior to dialysis 3 times weekly. Patient taking differently: Take 0.5 mg by mouth 2 (two) times daily. 10/29/20  Yes Hoyt Koch, MD  clopidogrel (PLAVIX) 75 MG tablet Take 1 tablet (75 mg total) by mouth daily with breakfast. 09/26/20  Yes Burnell Blanks, MD  diphenhydrAMINE (BENADRYL) 25 MG tablet Take 1 tablet (25 mg total) by mouth every 8 (eight) hours as needed for itching. 01/14/21  Yes Ripley Fraise, MD  glucose blood (ACCU-CHEK GUIDE) test strip USE AS INSTRUCTED TO CHECK BLOOD SUGAR 3 TIMES DAILY AS NEEDED 08/21/20  Yes Elayne Snare, MD  insulin glargine, 2 Unit Dial, (TOUJEO MAX SOLOSTAR) 300 UNIT/ML Solostar Pen INJECT 80 UNITS UNDER THE SKIN IN THE MORNING AND 80 UNITS IN THE EVENING Patient taking differently: Inject 80 Units into the skin 2 (two) times daily. 12/05/20  Yes Elayne Snare, MD  Insulin Pen Needle (DROPLET PEN NEEDLES) 32G X 4 MM MISC USE TO INJECT FIVE TIMES DAILY AS DIRECTED 02/10/21  Yes Elayne Snare, MD   Insulin Pen Needle 32G X 8 MM MISC Use with insulin x5 times a day Patient taking differently: 1 each by Other route See admin instructions. Use with insulin x5 times a day 08/16/19  Yes Elayne Snare, MD  insulin regular human CONCENTRATED (HUMULIN R U-500 KWIKPEN) 500 UNIT/ML kwikpen Inject 50-70 Units into the skin See admin instructions. Inject 50 units ad breakfast , 70 units at lunch, and 60 units at dinner 12/05/20  Yes Elayne Snare, MD  midodrine (PROAMATINE) 10 MG tablet Take 10 mg by mouth 3 (three) times daily. 09/27/20  Yes [provider]  mupirocin ointment (BACTROBAN) 2 % Apply 1 application topically 2 (two) times daily. 01/21/21  Yes McDonald, Stephan Minister, DPM  pantoprazole (PROTONIX) 40 MG tablet Take 1 tablet (40 mg total) by mouth daily. 01/17/21  Yes Burnell Blanks, MD  sevelamer carbonate (RENVELA) 800 MG tablet Take 2,400-3,200 mg by mouth in the morning, at noon, in the evening, and at bedtime. Takes 4 tablets with meals and 2 tablets with snacks 10/15/20  Yes [provider]  silver sulfADIAZINE (SILVADENE) 1 % cream Apply 1 application topically daily. 02/03/21  Yes Trula Slade, DPM  VELTASSA 8.4 g packet Take 8.4 g by mouth daily.  12/30/16  Yes [provider]  warfarin (COUMADIN) 5 MG tablet TAKE 1 TABLET EVERY DAY OR AS DIRECTED BY COUMADIN CLINIC ADMIN INSTRUCTIONS Patient taking differently: Take 5 mg by mouth daily. 02/10/21  Yes Hoyt Koch, MD  cephALEXin (KEFLEX) 500 MG capsule Take 1 capsule (500 mg total) by mouth 2 (two) times daily. 02/03/21   Trula Slade, DPM  Methoxy PEG-Epoetin Beta (MIRCERA IJ) Mircera 08/15/20 08/14/21  [provider]  predniSONE (DELTASONE) 50 MG tablet 1 tablet PO QD X4 days 01/14/21   Ripley Fraise, MD   Current Facility-Administered Medications  Medication Dose Route Frequency Provider Last Rate Last Admin  . acetaminophen (TYLENOL) tablet 650 mg  650 mg Oral Q4H PRN Irene Pap N,  DO      . aspirin chewable tablet 324 mg  324 mg Oral NOW Irene Pap N, DO       Or  . aspirin suppository 300 mg  300 mg Rectal NOW Kayleen Memos, DO      . [START ON 02/15/2021] aspirin EC tablet 81 mg  81 mg Oral Daily Hall, Carole N, DO      . atorvastatin (LIPITOR) tablet 80 mg  80 mg Oral Daily Irene Pap N, DO   80 mg at 02/14/21 0851  . clonazePAM (KLONOPIN) tablet 0.5 mg  0.5 mg Oral BID Etta Quill, DO   0.5 mg at 02/14/21 0546  . heparin injection 5,000 Units  5,000 Units Subcutaneous Q8H Hall, Carole N, DO      . insulin aspart (novoLOG) injection 0-5 Units  0-5 Units Subcutaneous QHS Hall, Carole N, DO      . insulin aspart (novoLOG) injection 0-6 Units  0-6 Units Subcutaneous TID WC Hall, Carole N, DO      . melatonin tablet 3 mg  3 mg Oral QHS PRN Irene Pap N, DO      . midodrine (PROAMATINE) tablet 10 mg  10 mg Oral TID with meals Irene Pap N, DO   10 mg at 02/14/21 0851  . nitroGLYCERIN (NITROSTAT) SL tablet 0.4 mg  0.4 mg Sublingual Q5 Min x 3 PRN Irene Pap N, DO      . prochlorperazine (COMPAZINE) injection 10 mg  10 mg Intravenous Q6H PRN Irene Pap N, DO      . warfarin (COUMADIN) tablet 7.5 mg  7.5 mg Oral ONCE-1600 Hall, Carole N, DO      .  Warfarin - Pharmacist Dosing Inpatient   Does not apply q1600 Kayleen Memos, DO       Labs: Basic Metabolic Panel: Recent Labs  Lab 02/13/21 2240 02/14/21 0328  NA 137  --   K 4.7  --   CL 97*  --   CO2 32  --   GLUCOSE 298*  --   BUN 36*  --   CREATININE 7.70* 7.81*  CALCIUM 8.6*  --    CBC: Recent Labs  Lab 02/13/21 2240 02/14/21 0328  WBC 8.2 7.4  HGB 11.4* 11.2*  HCT 39.9 38.9*  MCV 102.0* 101.8*  PLT 223 219   Studies/Results: DG Chest 2 View  Result Date: 02/13/2021 CLINICAL DATA:  Chest pain EXAM: CHEST - 2 VIEW COMPARISON:  Radiograph 01/16/2021, CT 12/13/2007 FINDINGS: Tunneled right IJ approach dual lumen dialysis catheter tip terminates in the superior cavoatrial junction. Vascular  stenting noted in the right axillary tissues. Telemetry leads overlie the chest. There is diffuse pulmonary vascular congestion with hazy and fine interstitial opacities throughout both lungs in a mid to lower lung predominance. Some peripheral septal lines are present as well. Cardiomegaly is similar to comparison exams accounting for differences in technique. The aorta is calcified. The remaining cardiomediastinal contours are unremarkable. No acute osseous or soft tissue abnormality. IMPRESSION: Features suggestive of CHF/volume overload with cardiomegaly, vascular congestion and pulmonary edema. Aortic Atherosclerosis (ICD10-I70.0). Electronically Signed   By: Lovena Le M.D.   On: 02/13/2021 23:14   Dialysis Orders:  TTS at [facility] - last 5/3 4:30hr, 250dialyzer, 400/A1.5, EDW 131kg, 2K/2Ca, TDC, no heparin - Finishing course of IV iron, Hgb 11.7 - Calcitriol 1.13mg PO q HD - On mido, Veltassa, Renvela/Auryxia as binders, PO sensipar 30 qhs.  Assessment/Plan: 1.  Malfunctioning TDC catheter: For exchange today, per VVS. 2.  Elevated troponin/CAD: Monitor per cardiology 3.  ESRD:  Usual TTS schedule - last HD 5/3. Will plan for dialysis after exchange, later today. 4.  Hypotension/volume: Chronic hypotension (on mido), continue same EDW with HD. 5.  Anemia: Hgb > 11, no ESA for now. 6.  Metabolic bone disease: Ca ok, Phos pending. Continue home meds. 7.  T2DM 8.  A-fib: On warfarin, per primary.  KVeneta Penton PA-C 02/14/2021, 10:09 AM  [employer]

## 2021-02-14 NOTE — Anesthesia Postprocedure Evaluation (Signed)
Anesthesia Post Note  Patient: [name] [name]  Procedure(s) Performed: EXCHANGE OF A DIALYSIS CATHETER RIGHT INTERNAL JUGULAR (Right Neck)     Patient location during evaluation: PACU Anesthesia Type: General Level of consciousness: awake and alert Pain management: pain level controlled Vital Signs Assessment: post-procedure vital signs reviewed and stable Respiratory status: spontaneous breathing, nonlabored ventilation, respiratory function stable and patient connected to nasal cannula oxygen Cardiovascular status: blood pressure returned to baseline and stable Postop Assessment: no apparent nausea or vomiting Anesthetic complications: no   No complications documented.  Last Vitals:  Vitals:   02/14/21 1947 02/14/21 2017  BP: (!) 76/45 (!) 66/37  Pulse: 89 67  Resp: 15 17  Temp:    SpO2: 97% 97%    Last Pain:  Vitals:   02/14/21 1847  TempSrc: Oral  PainSc:                  Keenya Matera COKER

## 2021-02-14 NOTE — Progress Notes (Signed)
ANTICOAGULATION CONSULT NOTE - Initial Consult  Pharmacy Consult for Coumadin Indication: atrial fibrillation  Allergies  Allergen Reactions  . Shellfish-Derived Products Anaphylaxis  . Ace Inhibitors Cough  . Hydrocodone Other (See Comments)    Hallucinations  . Pork-Derived Products Other (See Comments)    Entry determined to be clinically insignificant: OK TO GIVE HEPARIN (has tolerated well in the past)  Patient had allergic episode in 1989 after he ate pork at a fair. He developed fever and rash and went to the ED, but he did not have breathing issues. Received heparin in the past. Patient ok with removing pork allergy and listing as intolerance instead.    Patient Measurements: Height: _0  (177.8 cm) Weight: 130 kg (286 lb 9.6 oz) IBW/kg (Calculated) : 73  Vital Signs: Temp: 98.4 F (36.9 C) (05/05 2231) Temp Source: Oral (05/05 2231) BP: 120/93 (05/06 0345) Pulse Rate: 108 (05/06 0316)  Labs: Recent Labs    02/13/21 2240 02/14/21 0024 02/14/21 0328  HGB 11.4*  --  11.2*  HCT 39.9  --  38.9*  PLT 223  --  219  LABPROT 18.7*  --   --   INR 1.6*  --   --   CREATININE 7.70*  --   --   TROPONINIHS 572* 576*  --     Estimated Creatinine Clearance: 14.3 mL/min (A) (by C-G formula based on SCr of 7.7 mg/dL (H)).   Medical History: Past Medical History:  Diagnosis Date  . Allergic rhinitis   . Allergy   . Anemia   . Aortic insufficiency    SBE in 2016 >> pt declined AVR // a. Echo 7/16:  Mod LVH, EF 50-55%, mild to mod AI, severe LAE, PASP 57 mmHg (LV ID end diastolic 87.5 mm);  b. Echo 2/17:  Mod LVH, EF 50-55%, mod AI, MAC, mild MR, mod LAE, mild to mod TR, PASP 63 mmHg // Echo 2/21: normal EF, mild MR, mild AI  . Atrial fibrillation and flutter ([place])    chronic anticoag with Warfarin // bradycardia noted during admit 10/21  . Bilateral carpal tunnel syndrome   . Bradycardia    brady/low BP event during admit in 10/21 >> poor candidate for pacer/EPS due  to high risk of infection with ESRD; pacer not recommended by EP  . CAD (coronary artery disease)    s/p NSTEMI 10/21: cath>> pLAD 99 >> tx with DES; Lat RI 100 and OM1 100 - med Rx  . Cataract    removed both eyes   . Cholecystitis    dx during admx 10/21 >> HIDA neg // exam benign // high risk for surgery >> no surgical intervention recommended.   . Claustrophobia   . COPD (chronic obstructive pulmonary disease) ([place])   . Coronary artery disease   . Diabetes mellitus    Type II  . Diabetic peripheral neuropathy ([place])   . Diabetic retinopathy ([place])   . Discitis of lumbar region    resolved  . DVT (deep venous thrombosis) ([place]) 10/07/2015   LLE  . Dysrhythmia 07/2020   afib  . Endocarditis 11/28/14  . ESRD (end stage renal disease) on dialysis [facility])    s/p renal transplant in 2009, failed in 2016  . Gait disorder   . Gastroparesis   . GERD (gastroesophageal reflux disease)   . Gout   . Hearing difficulty   . Hemodialysis patient ([place])    tuesday, thursday, saturday   . HFrEF (HF with reduced EF)  Ischemic CM // Echo 10/21: EF 40-45, dist sept and apical AK, mild LVH, normal RVSF, mod MAC  . Hyperlipidemia   . Hypertension   . Incomplete bladder emptying   . Leg pain 02/05/11   with walking  . Morbid obesity ([place])   . Myocardial infarction ([place]) 07/2020  . Obstructive sleep apnea    not using CPAP- lost 100lbs  . Osteomyelitis ([place])   . Posttraumatic stress disorder   . Rotator cuff disorder   . Secondary hyperparathyroidism ([place])   . Sepsis ([place])    Postive blood cultures -   . Sleep apnea    no cpap  . Urethral stricture   . Vitamin D deficiency     Medications:  Medications Prior to Admission  Medication Sig Dispense Refill Last Dose  . Accu-Chek FastClix Lancets MISC USE TO CHECK BLOOD SUGARS 4 TIMES A DAY. 360 each 4   . allopurinol (ZYLOPRIM) 100 MG tablet Take 1 tablet (100 mg total) by mouth 3 (three) times a week. After dialysis (Patient taking  differently: Take 100 mg by mouth every Tuesday, Thursday, and Saturday at 6 PM. After dialysis) 30 tablet 6 Past Week at Unknown time  . atorvastatin (LIPITOR) 80 MG tablet Take 1 tablet (80 mg total) by mouth daily. 90 tablet 3 Past Week at Unknown time  . AURYXIA 1 GM 210 MG(Fe) tablet Take 630 mg by mouth 3 (three) times daily with meals. Takes 3 tablets with meal and 2 tablet with snacks   Past Week at Unknown time  . busPIRone (BUSPAR) 5 MG tablet TAKE 1 TABLET TWICE DAILY AS NEEDED (Patient taking differently: Take 5 mg by mouth 2 (two) times daily.) 180 tablet 0 Past Week at Unknown time  . clonazePAM (KLONOPIN) 0.5 MG tablet Use 1-2 tablets prior to dialysis 3 times weekly. (Patient taking differently: Take 0.5 mg by mouth 2 (two) times daily.) 24 tablet 3 Past Week at Unknown time  . clopidogrel (PLAVIX) 75 MG tablet Take 1 tablet (75 mg total) by mouth daily with breakfast. 90 tablet 3 Past Week at Unknown time  . diphenhydrAMINE (BENADRYL) 25 MG tablet Take 1 tablet (25 mg total) by mouth every 8 (eight) hours as needed for itching. 10 tablet 0 Past Week at Unknown time  . glucose blood (ACCU-CHEK GUIDE) test strip USE AS INSTRUCTED TO CHECK BLOOD SUGAR 3 TIMES DAILY AS NEEDED 300 strip 12   . insulin glargine, 2 Unit Dial, (TOUJEO MAX SOLOSTAR) 300 UNIT/ML Solostar Pen INJECT 80 UNITS UNDER THE SKIN IN THE MORNING AND 80 UNITS IN THE EVENING (Patient taking differently: Inject 80 Units into the skin 2 (two) times daily.) 48 mL 1 Past Week at Unknown time  . Insulin Pen Needle (DROPLET PEN NEEDLES) 32G X 4 MM MISC USE TO INJECT FIVE TIMES DAILY AS DIRECTED 500 each 1   . Insulin Pen Needle 32G X 8 MM MISC Use with insulin x5 times a day (Patient taking differently: 1 each by Other route See admin instructions. Use with insulin x5 times a day) 500 each 1   . insulin regular human CONCENTRATED (HUMULIN R U-500 KWIKPEN) 500 UNIT/ML kwikpen Inject 50-70 Units into the skin See admin instructions.  Inject 50 units ad breakfast , 70 units at lunch, and 60 units at dinner 6 mL 3 Past Week at Unknown time  . midodrine (PROAMATINE) 10 MG tablet Take 10 mg by mouth 3 (three) times daily.   Past Week at Unknown time  .  mupirocin ointment (BACTROBAN) 2 % Apply 1 application topically 2 (two) times daily. 30 g 2 Past Week at Unknown time  . pantoprazole (PROTONIX) 40 MG tablet Take 1 tablet (40 mg total) by mouth daily. 90 tablet 3 Past Week at Unknown time  . sevelamer carbonate (RENVELA) 800 MG tablet Take 2,400-3,200 mg by mouth in the morning, at noon, in the evening, and at bedtime. Takes 4 tablets with meals and 2 tablets with snacks   Past Week at Unknown time  . silver sulfADIAZINE (SILVADENE) 1 % cream Apply 1 application topically daily. 50 g 0 Past Month at Unknown time  . VELTASSA 8.4 g packet Take 8.4 g by mouth daily.    Past Week at Unknown time  . warfarin (COUMADIN) 5 MG tablet TAKE 1 TABLET EVERY DAY OR AS DIRECTED BY COUMADIN CLINIC ADMIN INSTRUCTIONS (Patient taking differently: Take 5 mg by mouth daily.) 100 tablet 1 Past Week at Unknown time  . cephALEXin (KEFLEX) 500 MG capsule Take 1 capsule (500 mg total) by mouth 2 (two) times daily. 20 capsule 0   . Methoxy PEG-Epoetin Beta (MIRCERA IJ) Mircera     . predniSONE (DELTASONE) 50 MG tablet 1 tablet PO QD X4 days 4 tablet 0     Assessment: 55 y.o. male admitted with chest pain, h/o Afib, to continue Coumadin.  INR subtherapeutic on admission  Goal of Therapy:  INR 2-3 Monitor platelets by anticoagulation protocol: Yes   Plan:  Coumadin 7.5 mg today Daily INR  Saintclair Schroader, Bronson Curb 02/14/2021,4:30 AM

## 2021-02-14 NOTE — Progress Notes (Signed)
PT SEEN and admitted by Dr. Nevada Crane earlier this morning.  55 year old gentleman with prior history of end-stage renal disease on dialysis on Tuesdays Thursdays and Saturdays, multiple upper extremity AV access procedures, recent tunneled HD catheter placed recently appears to be malfunctioning and he could not complete his dialysis on 02/13/2021.  He was brought into ED for possible exchange of the catheter.  While in the ED patient reports he had atypical chest pain. Chest pain has since resolved.  Cardiology consulted recommended no further work-up at this time.  He is scheduled to undergo catheter exchange later today following which he will have dialysis session.   Hosie Poisson, MD

## 2021-02-14 NOTE — Transfer of Care (Signed)
Immediate Anesthesia Transfer of Care Note  Patient: [name] [name]  Procedure(s) Performed: EXCHANGE OF A DIALYSIS CATHETER RIGHT INTERNAL JUGULAR (Right Neck)  Patient Location: PACU  Anesthesia Type:General  Level of Consciousness: awake, alert  and oriented  Airway & Oxygen Therapy: Patient connected to nasal cannula oxygen  Post-op Assessment: Post -op Vital signs reviewed and stable  Post vital signs: stable  Last Vitals:  Vitals Value Taken Time  BP    Temp    Pulse 118 02/14/21 1515  Resp 18 02/14/21 1515  SpO2 98 % 02/14/21 1515  Vitals shown include unvalidated device data.  Last Pain:  Vitals:   02/14/21 1109  TempSrc:   PainSc: 0-No pain      Patients Stated Pain Goal: 3 (66/44/03 4742)  Complications: No complications documented.

## 2021-02-15 ENCOUNTER — Encounter (HOSPITAL_COMMUNITY): Payer: Self-pay | Admitting: Surgery

## 2021-02-15 ENCOUNTER — Inpatient Hospital Stay (HOSPITAL_COMMUNITY): Payer: Medicare HMO

## 2021-02-15 DIAGNOSIS — R778 Other specified abnormalities of plasma proteins: Secondary | ICD-10-CM

## 2021-02-15 DIAGNOSIS — I429 Cardiomyopathy, unspecified: Secondary | ICD-10-CM | POA: Diagnosis not present

## 2021-02-15 DIAGNOSIS — I4892 Unspecified atrial flutter: Secondary | ICD-10-CM | POA: Diagnosis not present

## 2021-02-15 DIAGNOSIS — Z992 Dependence on renal dialysis: Secondary | ICD-10-CM

## 2021-02-15 DIAGNOSIS — Z95828 Presence of other vascular implants and grafts: Secondary | ICD-10-CM | POA: Diagnosis not present

## 2021-02-15 DIAGNOSIS — R079 Chest pain, unspecified: Secondary | ICD-10-CM

## 2021-02-15 LAB — ECHOCARDIOGRAM COMPLETE
AR max vel: 2.23 cm2
AV Area VTI: 2.1 cm2
AV Area mean vel: 2.07 cm2
AV Mean grad: 5 mmHg
AV Peak grad: 10.1 mmHg
Ao pk vel: 1.59 m/s
Area-P 1/2: 3.76 cm2
Calc EF: 29.9 %
Height: 70 in
MV M vel: 3.36 m/s
MV Peak grad: 45.2 mmHg
P 1/2 time: 336 msec
S' Lateral: 3.2 cm
Single Plane A2C EF: 20.5 %
Single Plane A4C EF: 37.4 %
Weight: 4860.7 oz

## 2021-02-15 LAB — PROTIME-INR
INR: 1.5 — ABNORMAL HIGH (ref 0.8–1.2)
Prothrombin Time: 18.3 seconds — ABNORMAL HIGH (ref 11.4–15.2)

## 2021-02-15 LAB — BASIC METABOLIC PANEL
Anion gap: 9 (ref 5–15)
BUN: 26 mg/dL — ABNORMAL HIGH (ref 6–20)
CO2: 28 mmol/L (ref 22–32)
Calcium: 8.5 mg/dL — ABNORMAL LOW (ref 8.9–10.3)
Chloride: 99 mmol/L (ref 98–111)
Creatinine, Ser: 6.51 mg/dL — ABNORMAL HIGH (ref 0.61–1.24)
GFR, Estimated: 9 mL/min — ABNORMAL LOW (ref >60)
Glucose, Bld: 157 mg/dL — ABNORMAL HIGH (ref 70–99)
Potassium: 5.6 mmol/L — ABNORMAL HIGH (ref 3.5–5.1)
Sodium: 136 mmol/L (ref 135–145)

## 2021-02-15 LAB — TROPONIN I (HIGH SENSITIVITY): Troponin I (High Sensitivity): 950 ng/L (ref <18)

## 2021-02-15 LAB — GLUCOSE, CAPILLARY
Glucose-Capillary: 87 mg/dL (ref 70–99)
Glucose-Capillary: 123 mg/dL — ABNORMAL HIGH (ref 70–99)
Glucose-Capillary: 178 mg/dL — ABNORMAL HIGH (ref 70–99)
Glucose-Capillary: 199 mg/dL — ABNORMAL HIGH (ref 70–99)
Glucose-Capillary: 225 mg/dL — ABNORMAL HIGH (ref 70–99)

## 2021-02-15 LAB — PHOSPHORUS: Phosphorus: 5.2 mg/dL — ABNORMAL HIGH (ref 2.5–4.6)

## 2021-02-15 LAB — CBC
HCT: 38.9 % — ABNORMAL LOW (ref 39.0–52.0)
Hemoglobin: 11.1 g/dL — ABNORMAL LOW (ref 13.0–17.0)
MCH: 29.4 pg (ref 26.0–34.0)
MCHC: 28.5 g/dL — ABNORMAL LOW (ref 30.0–36.0)
MCV: 103.2 fL — ABNORMAL HIGH (ref 80.0–100.0)
Platelets: 207 10*3/uL (ref 150–400)
RBC: 3.77 MIL/uL — ABNORMAL LOW (ref 4.22–5.81)
RDW: 21.7 % — ABNORMAL HIGH (ref 11.5–15.5)
WBC: 6.7 10*3/uL (ref 4.0–10.5)
nRBC: 0 % (ref 0.0–0.2)

## 2021-02-15 LAB — MAGNESIUM: Magnesium: 2.2 mg/dL (ref 1.7–2.4)

## 2021-02-15 MED ORDER — BISACODYL 5 MG PO TBEC
10.0000 mg | DELAYED_RELEASE_TABLET | Freq: Every day | ORAL | Status: DC | PRN
  Administered 2021-02-15: 10 mg via ORAL
  Filled 2021-02-15 (×2): qty 2

## 2021-02-15 MED ORDER — ORAL CARE MOUTH RINSE: 15.0000 mL | Freq: Once | OROMUCOSAL | Status: DC

## 2021-02-15 MED ORDER — ALBUMIN HUMAN 25 % IV SOLN
25.0000 g | Freq: Once | INTRAVENOUS | Status: AC
  Administered 2021-02-15: 25 g via INTRAVENOUS
  Filled 2021-02-15: qty 100

## 2021-02-15 MED ORDER — CHLORHEXIDINE GLUCONATE CLOTH 2 % EX PADS
6.0000 | MEDICATED_PAD | Freq: Every day | CUTANEOUS | Status: DC
  Administered 2021-02-15 – 2021-02-16 (×2): 6 via TOPICAL

## 2021-02-15 MED ORDER — INSULIN GLARGINE 100 UNIT/ML ~~LOC~~ SOLN
40.0000 [IU] | Freq: Two times a day (BID) | SUBCUTANEOUS | Status: DC
  Administered 2021-02-15 – 2021-02-16 (×4): 40 [IU] via SUBCUTANEOUS
  Filled 2021-02-15 (×6): qty 0.4

## 2021-02-15 MED ORDER — HEPARIN SODIUM (PORCINE) 1000 UNIT/ML IJ SOLN
INTRAMUSCULAR | Status: AC
  Administered 2021-02-15: 3800 [IU] via ARTERIOVENOUS_FISTULA
  Filled 2021-02-15: qty 4

## 2021-02-15 MED ORDER — PERFLUTREN LIPID MICROSPHERE
1.0000 mL | INTRAVENOUS | Status: AC | PRN
  Administered 2021-02-15: 1 mL via INTRAVENOUS
  Filled 2021-02-15: qty 10

## 2021-02-15 MED ORDER — CHLORHEXIDINE GLUCONATE 0.12 % MT SOLN
15.0000 mL | Freq: Once | OROMUCOSAL | Status: DC
  Filled 2021-02-15: qty 15

## 2021-02-15 MED ORDER — WARFARIN SODIUM 7.5 MG PO TABS: 7.5000 mg | ORAL_TABLET | Freq: Once | ORAL | Status: DC

## 2021-02-15 MED ORDER — HEPARIN (PORCINE) 25000 UT/250ML-% IV SOLN
2250.0000 [IU]/h | INTRAVENOUS | Status: DC
  Administered 2021-02-15: 1200 [IU]/h via INTRAVENOUS
  Administered 2021-02-16: 1650 [IU]/h via INTRAVENOUS
  Administered 2021-02-17: 2250 [IU]/h via INTRAVENOUS
  Filled 2021-02-15 (×3): qty 250

## 2021-02-15 MED ORDER — SENNOSIDES-DOCUSATE SODIUM 8.6-50 MG PO TABS
2.0000 | ORAL_TABLET | Freq: Two times a day (BID) | ORAL | Status: DC
  Administered 2021-02-15 – 2021-02-17 (×4): 2 via ORAL
  Filled 2021-02-15 (×4): qty 2

## 2021-02-15 MED ORDER — INSULIN GLARGINE 100 UNIT/ML ~~LOC~~ SOLN
80.0000 [IU] | Freq: Two times a day (BID) | SUBCUTANEOUS | Status: DC
  Filled 2021-02-15 (×2): qty 0.8

## 2021-02-15 MED ORDER — FENTANYL CITRATE (PF) 100 MCG/2ML IJ SOLN
25.0000 ug | INTRAMUSCULAR | Status: AC | PRN
  Administered 2021-02-15: 50 ug via INTRAVENOUS
  Administered 2021-02-16 (×2): 25 ug via INTRAVENOUS
  Filled 2021-02-15 (×3): qty 2

## 2021-02-15 NOTE — Progress Notes (Signed)
[facility] Progress Note   Subjective:  Seen in room. Got new catheter and partial HD last night. Says he was frustrated that RN didn't want to pull fluid off him d/t low BP which is chronic, so he ended the HD early - only ended up getting net UF 153mL.  Objective Vitals:   02/15/21 0455 02/15/21 0556 02/15/21 0736 02/15/21 0804  BP: (!) 87/66 (!) 82/18 (!) 78/40 92/69  Pulse: 92 87 91 100  Resp:   18 18  Temp:   (!) 97.1 F (36.2 C) (!) 97.5 F (36.4 C)  TempSrc:   Oral Oral  SpO2: 100% 98% 100% 100%  Weight:      Height:       Physical Exam General: Well appearing man, NAD. Nasal O2 in place. Heart: RRR; no murmur Lungs: CTAB Abdomen: soft, non-tender Extremities: No LE edema Dialysis Access: new [facility] R chest  Additional Objective Labs: Basic Metabolic Panel: Recent Labs  Lab 02/13/21 2240 02/14/21 0328 02/14/21 1356  NA 137  --  138  K 4.7  --  5.1  CL 97*  --  100  CO2 32  --   --   GLUCOSE 298*  --  104*  BUN 36*  --  40*  CREATININE 7.70* 7.81* 10.10*  CALCIUM 8.6*  --   --    CBC: Recent Labs  Lab 02/13/21 2240 02/14/21 0328 02/14/21 1356  WBC 8.2 7.4  --   HGB 11.4* 11.2* 12.6*  HCT 39.9 38.9* 37.0*  MCV 102.0* 101.8*  --   PLT 223 219  --    Studies/Results: DG Chest 2 View  Result Date: 02/13/2021 CLINICAL DATA:  Chest pain EXAM: CHEST - 2 VIEW COMPARISON:  Radiograph 01/16/2021, CT 12/13/2007 FINDINGS: Tunneled right IJ approach dual lumen dialysis catheter tip terminates in the superior cavoatrial junction. Vascular stenting noted in the right axillary tissues. Telemetry leads overlie the chest. There is diffuse pulmonary vascular congestion with hazy and fine interstitial opacities throughout both lungs in a mid to lower lung predominance. Some peripheral septal lines are present as well. Cardiomegaly is similar to comparison exams accounting for differences in technique. The aorta is calcified. The remaining cardiomediastinal  contours are unremarkable. No acute osseous or soft tissue abnormality. IMPRESSION: Features suggestive of CHF/volume overload with cardiomegaly, vascular congestion and pulmonary edema. Aortic Atherosclerosis (ICD10-I70.0). Electronically Signed   By: Lovena Le M.D.   On: 02/13/2021 23:14   DG Chest Port 1 View  Result Date: 02/14/2021 CLINICAL DATA:  Catheter placement EXAM: PORTABLE CHEST 1 VIEW COMPARISON:  02/14/2021 FINDINGS: Right-sided central venous catheter tip over the mid right atrium. Incompletely visualized left lung base. Cardiomegaly with vascular congestion and mild edema. Aortic atherosclerosis. No pneumothorax. Upper normal mediastinal silhouette, likely due to portable technique and slight degree of patient rotation. IMPRESSION: 1. Right-sided central venous catheter tip over the mid right atrium. No pneumothorax. 2. Cardiomegaly with vascular congestion, mild pulmonary edema and probable pleural effusions. Electronically Signed   By: Donavan Foil M.D.   On: 02/14/2021 18:18   HYBRID OR IMAGING ([facility] ONLY)  Result Date: 02/14/2021 There is no interpretation for this exam.  This order is for images obtained during a surgical procedure.  Please See "Surgeries" Tab for more information regarding the procedure.   Medications:  . aspirin EC  81 mg Oral Daily  . atorvastatin  80 mg Oral Daily  . chlorhexidine  15 mL Mouth/Throat Once   Or  .  mouth rinse  15 mL Mouth Rinse Once  . Chlorhexidine Gluconate Cloth  6 each Topical Daily  . clonazePAM  0.5 mg Oral BID  . heparin  5,000 Units Subcutaneous Q8H  . heparin sodium (porcine)      . insulin aspart  0-5 Units Subcutaneous QHS  . insulin aspart  0-6 Units Subcutaneous TID WC  . insulin glargine  80 Units Subcutaneous BID  . midodrine  10 mg Oral TID with meals  . senna-docusate  2 tablet Oral BID  . Warfarin - Pharmacist Dosing Inpatient   Does not apply q1600    Dialysis Orders: TTS at [facility] - last 5/3 4:30hr, 250dialyzer,  400/A1.5, EDW 131kg, 2K/2Ca, TDC, no heparin - Finishing course of IV iron, Hgb 11.7 - Calcitriol 1.67mcg PO q HD - On mido, Veltassa, Renvela/Auryxia as binders, PO sensipar 30 qhs.  Assessment/Plan: 1.  Malfunctioning TDC catheter: S/p exchange 5/6, per [facility]VS. 2.  Elevated troponin/CAD: Monitor per cardiology 3.  ESRD:  Usual TTS schedule. Got short, ineffective HD last night -> will do another HD today. 4.  Hypotension/volume: Chronic hypotension (on mido), continue same EDW with HD. 5.  Anemia: Hgb > 11, no ESA for now. 6.  Metabolic bone disease: Ca ok, Phos pending. Continue home meds. 7.  T2DM 8.  A-fib: On warfarin, per primary.   Veneta Penton, PA-C 02/15/2021, 9:33 AM  [employer]

## 2021-02-15 NOTE — Progress Notes (Addendum)
Patient returned from HD at 1852hrs.

## 2021-02-15 NOTE — Progress Notes (Signed)
Cross-coverage note:   Patient seen for asymptomatic hypotension, BP 60s/40s. Patient appears comfortable, has no infectious s/s and no apparent bleeding, states his SBP usually runs in 80s. Plan to give albumin.

## 2021-02-15 NOTE — Progress Notes (Signed)
PROGRESS NOTE    [name] [name]  VOH:[id number] DOB: 1966-03-13 DOA: 02/13/2021 PCP: Hoyt Koch, MD    Chief Complaint  Patient presents with  . Chest Pain    Vascular Access Malfunction     Brief Narrative:  55 year old gentleman with prior history of end-stage renal disease on dialysis on Tuesdays Thursdays and Saturdays, multiple upper extremity AV access procedures, recent tunneled HD catheter placed recently appears to be malfunctioning and he could not complete his dialysis on 02/13/2021.  He was brought into ED for possible exchange of the catheter.  While in the ED patient reports he had atypical chest pain. Chest pain has since resolved. He underwent HD catheter exchange on 02/14/21.    Assessment & Plan:   Active Problems:   Chest pain   Chest pain:  Resolved. Minimally elevated troponins.  Cardiology on board.  echocardiogram ordered  Last cath in 07/2020.     ESRD ON HD Hd as per Nephrology.    H/o CAD s/p PCI IN 10/21 Currently chest pain free.  Continue with aspirin and lipitor.    Diabetes mellitus:  uncontrolled.  Insulin dependent with ESRD.  CBG (last 3)  Recent Labs    02/15/21 0222 02/15/21 0732 02/15/21 1118  GLUCAP 87 123* 178*   Resume home meds.  Last A1c is 7.2%   HD catheter malfunction:  Replaced by vascular surgery this admission .      Hypotension:  Chronic and is on midodrine.     Chronic systolic heart failure:  Fluid management with ID.     Atrial flutter  Rate controlled and on coumadin for anti coagulation.  Sub therapeutic INR.     Anemia f chronic disease:  Baseline hemoglobin around 11 and stable.    OSA;  On CPAP at home.    DVT prophylaxis: heparin.  Code Status: full code.  Family Communication: none at bedside Disposition:   Status is: Inpatient  Remains inpatient appropriate because:Ongoing diagnostic testing needed not appropriate for outpatient work up and IV treatments  appropriate due to intensity of illness or inability to take PO   Dispo:  Patient From: Home  Planned Disposition: Home  Medically stable for discharge: No         Consultants:   Vascular surgery.   Nephrology  Cardiology.   Procedures:HD catheter exchange.    Antimicrobials: none.    Subjective: No chest pain today   Objective: Vitals:   02/15/21 1500 02/15/21 1515 02/15/21 1530 02/15/21 1600  BP: (!) 101/40 (!) 75/21 (!) 66/22 (!) 83/56  Pulse:   98 (!) 101  Resp: (!) 21 18 (!) 23 20  Temp:      TempSrc:      SpO2: 94%  98% 100%  Weight:      Height:        Intake/Output Summary (Last 24 hours) at 02/15/2021 1604 Last data filed at 02/15/2021 1300 Gross per 24 hour  Intake 580 ml  Output 175 ml  Net 405 ml   Filed Weights   02/13/21 2225 02/14/21 0437 02/15/21 0426  Weight: 130 kg 134.9 kg (!) 137.8 kg    Examination:  General exam: Appears calm and comfortable  Respiratory system: diminished air entry at bases.  Cardiovascular system: S1 & S2 heard, RRR. Leg edema present.  Gastrointestinal system: Abdomen is nondistended, soft and nontender. No organomegaly or masses felt. Normal bowel sounds heard. Central nervous system: Alert and oriented. No focal neurological deficits. Extremities: Symmetric 5 x  5 power. Skin: No rashes, lesions or ulcers Psychiatry: . Mood & affect appropriate.     Data Reviewed: I have personally reviewed following labs and imaging studies  CBC: Recent Labs  Lab 02/13/21 2240 02/14/21 0328 02/14/21 1356 02/15/21 0927  WBC 8.2 7.4  --  6.7  HGB 11.4* 11.2* 12.6* 11.1*  HCT 39.9 38.9* 37.0* 38.9*  MCV 102.0* 101.8*  --  103.2*  PLT 223 219  --  102    Basic Metabolic Panel: Recent Labs  Lab 02/13/21 2240 02/14/21 0328 02/14/21 1356 02/15/21 0927  NA 137  --  138 136  K 4.7  --  5.1 5.6*  CL 97*  --  100 99  CO2 32  --   --  28  GLUCOSE 298*  --  104* 157*  BUN 36*  --  40* 26*  CREATININE 7.70* 7.81*  10.10* 6.51*  CALCIUM 8.6*  --   --  8.5*  MG  --   --   --  2.2  PHOS  --   --   --  5.2*    GFR: Estimated Creatinine Clearance: 17.5 mL/min (A) (by C-G formula based on SCr of 6.51 mg/dL (H)).  Liver Function Tests: No results for input(s): AST, ALT, ALKPHOS, BILITOT, PROT, ALBUMIN in the last 168 hours.  CBG: Recent Labs  Lab 02/14/21 1743 02/14/21 2240 02/15/21 0222 02/15/21 0732 02/15/21 1118  GLUCAP 102* 89 87 123* 178*     Recent Results (from the past 240 hour(s))  Resp Panel by RT-PCR (Flu A&B, Covid) Nasopharyngeal Swab     Status: None   Collection Time: 02/13/21 10:38 PM   Specimen: Nasopharyngeal Swab; Nasopharyngeal(NP) swabs in vial transport medium  Result Value Ref Range Status   SARS Coronavirus 2 by RT PCR NEGATIVE NEGATIVE Final    Comment: (NOTE) SARS-CoV-2 target nucleic acids are NOT DETECTED.  The SARS-CoV-2 RNA is generally detectable in upper respiratory specimens during the acute phase of infection. The lowest concentration of SARS-CoV-2 viral copies this assay can detect is 138 copies/mL. A negative result does not preclude SARS-Cov-2 infection and should not be used as the sole basis for treatment or other patient management decisions. A negative result may occur with  improper specimen collection/handling, submission of specimen other than nasopharyngeal swab, presence of viral mutation(s) within the areas targeted by this assay, and inadequate number of viral copies(<138 copies/mL). A negative result must be combined with clinical observations, patient history, and epidemiological information. The expected result is Negative.  Fact Sheet for Patients:  EntrepreneurPulse.com.au  Fact Sheet for Healthcare Providers:  IncredibleEmployment.be  This test is no t yet approved or cleared by the [place] FDA and  has been authorized for detection and/or diagnosis of SARS-CoV-2 by FDA under an  Emergency Use Authorization (EUA). This EUA will remain  in effect (meaning this test can be used) for the duration of the COVID-19 declaration under Section 564(b)(1) of the Act, 21 U.S.C.section 360bbb-3(b)(1), unless the authorization is terminated  or revoked sooner.       Influenza A by PCR NEGATIVE NEGATIVE Final   Influenza B by PCR NEGATIVE NEGATIVE Final    Comment: (NOTE) The Xpert Xpress SARS-CoV-2/FLU/RSV plus assay is intended as an aid in the diagnosis of influenza from Nasopharyngeal swab specimens and should not be used as a sole basis for treatment. Nasal washings and aspirates are unacceptable for Xpert Xpress SARS-CoV-2/FLU/RSV testing.  Fact Sheet for Patients: EntrepreneurPulse.com.au  Fact Sheet for  Healthcare Providers: IncredibleEmployment.be  This test is not yet approved or cleared by the [place] and has been authorized for detection and/or diagnosis of SARS-CoV-2 by FDA under an Emergency Use Authorization (EUA). This EUA will remain in effect (meaning this test can be used) for the duration of the COVID-19 declaration under Section 564(b)(1) of the Act, 21 U.S.C. section 360bbb-3(b)(1), unless the authorization is terminated or revoked.  Performed at [facility], [place] [address], [place], [place] [address]          Radiology Studies: DG Chest 2 View  Result Date: 02/13/2021 CLINICAL DATA:  Chest pain EXAM: CHEST - 2 VIEW COMPARISON:  Radiograph 01/16/2021, CT 12/13/2007 FINDINGS: Tunneled right IJ approach dual lumen dialysis catheter tip terminates in the superior cavoatrial junction. Vascular stenting noted in the right axillary tissues. Telemetry leads overlie the chest. There is diffuse pulmonary vascular congestion with hazy and fine interstitial opacities throughout both lungs in a mid to lower lung predominance. Some peripheral septal lines are present as well. Cardiomegaly is similar to  comparison exams accounting for differences in technique. The aorta is calcified. The remaining cardiomediastinal contours are unremarkable. No acute osseous or soft tissue abnormality. IMPRESSION: Features suggestive of CHF/volume overload with cardiomegaly, vascular congestion and pulmonary edema. Aortic Atherosclerosis (ICD10-I70.0). Electronically Signed   By: Lovena Le M.D.   On: 02/13/2021 23:14   DG Chest Port 1 View  Result Date: 02/14/2021 CLINICAL DATA:  Catheter placement EXAM: PORTABLE CHEST 1 VIEW COMPARISON:  02/14/2021 FINDINGS: Right-sided central venous catheter tip over the mid right atrium. Incompletely visualized left lung base. Cardiomegaly with vascular congestion and mild edema. Aortic atherosclerosis. No pneumothorax. Upper normal mediastinal silhouette, likely due to portable technique and slight degree of patient rotation. IMPRESSION: 1. Right-sided central venous catheter tip over the mid right atrium. No pneumothorax. 2. Cardiomegaly with vascular congestion, mild pulmonary edema and probable pleural effusions. Electronically Signed   By: Donavan Foil M.D.   On: 02/14/2021 18:18   ECHOCARDIOGRAM COMPLETE  Result Date: 02/15/2021    ECHOCARDIOGRAM REPORT   Patient Name:   [name] [name] Date of Exam: 02/15/2021 Medical Rec #:  [id number]      Height:       70.0 in Accession #:    [id number]     Weight:       303.8 lb Date of Birth:  [birth year]      BSA:          2.493 m Patient Age:    58 years       BP:           78/40 mmHg Patient Gender: M              HR:           90 bpm. Exam Location:  Inpatient Procedure: 2D Echo, Cardiac Doppler, Color Doppler and Intracardiac            Opacification Agent Indications:    Elevated troponin  History:        Patient has prior history of Echocardiogram examinations, most                 recent 07/29/2020. CHF, Previous Myocardial Infarction, COPD,                 Arrythmias:Atrial Fibrillation, Signs/Symptoms:Murmur; Risk                  Factors:Hypertension, Diabetes and Dyslipidemia.  Sonographer:  TAMARA CROWN RDCS Referring Phys: [id number] [place] Comments: 07/27/2020 cath IMPRESSIONS  1. Left ventricular ejection fraction, by estimation, is 25 to 30%. The left ventricle has severely decreased function. The left ventricle demonstrates regional wall motion abnormalities. There is global hypokinesis with disproportionately severe hypokinesis of the left ventricular, mid-apical anteroseptal wall, anterior wall, apical segment and inferior wall. There is moderate concentric left ventricular hypertrophy. Left ventricular diastolic function could not be evaluated.  2. Right ventricular systolic function is moderately reduced. The right ventricular size is moderately enlarged. There is moderately elevated pulmonary artery systolic pressure.  3. Left atrial size was severely dilated.  4. A small pericardial effusion is present. The pericardial effusion is circumferential. There is no evidence of cardiac tamponade.  5. The mitral valve is degenerative. Mild mitral valve regurgitation. Mild mitral stenosis. The mean mitral valve gradient is 4.8 mmHg. Severe mitral annular calcification.  6. Tricuspid valve regurgitation is moderate.  7. The aortic valve is tricuspid. There is mild calcification of the aortic valve. There is mild thickening of the aortic valve. Aortic valve regurgitation is mild. Mild aortic valve sclerosis is present, with no evidence of aortic valve stenosis.  8. The inferior vena cava is dilated in size with <50% respiratory variability, suggesting right atrial pressure of 15 mmHg. Comparison(s): The left ventricular function is significantly worse. The left ventricular wall motion abnormalities are significantly worse. There appears to be interval ischemia or infarction in the LAD artery distribution. FINDINGS  Left Ventricle: Left ventricular ejection fraction, by estimation, is 25 to 30%. The left ventricle has  severely decreased function. The left ventricle demonstrates regional wall motion abnormalities. There is global hypokinesis with disproportionately severe hypokinesis of the left ventricular, mid-apical anteroseptal wall, anterior wall, apical segment and inferior wall. Definity contrast agent was given IV to delineate the left ventricular endocardial borders. The left ventricular internal cavity size was normal in size. There is moderate concentric left ventricular hypertrophy. Abnormal (paradoxical) septal motion, consistent with left bundle branch block. Left ventricular diastolic function could not be evaluated due to atrial fibrillation. Left ventricular diastolic function could not be evaluated. Right Ventricle: The right ventricular size is moderately enlarged. No increase in right ventricular wall thickness. Right ventricular systolic function is moderately reduced. There is moderately elevated pulmonary artery systolic pressure. The tricuspid  regurgitant velocity is 3.17 m/s, and with an assumed right atrial pressure of 15 mmHg, the estimated right ventricular systolic pressure is 17.6 mmHg. Left Atrium: Left atrial size was severely dilated. Right Atrium: Right atrial size was normal in size. Pericardium: A small pericardial effusion is present. The pericardial effusion is circumferential. There is no evidence of cardiac tamponade. Mitral Valve: The mitral valve is degenerative in appearance. Severe mitral annular calcification. Mild mitral valve regurgitation. Mild mitral valve stenosis. The mean mitral valve gradient is 4.8 mmHg with average heart rate of 85 bpm. Tricuspid Valve: The tricuspid valve is normal in structure. Tricuspid valve regurgitation is moderate. Aortic Valve: The aortic valve is tricuspid. There is mild calcification of the aortic valve. There is mild thickening of the aortic valve. Aortic valve regurgitation is mild. Aortic regurgitation PHT measures 336 msec. Mild aortic valve  sclerosis is present, with no evidence of aortic valve stenosis. Aortic valve mean gradient measures 5.0 mmHg. Aortic valve peak gradient measures 10.1 mmHg. Aortic valve area, by VTI measures 2.10 cm. Pulmonic Valve: The pulmonic valve was not well visualized. Pulmonic valve regurgitation is not visualized. Aorta: The aortic  root and ascending aorta are structurally normal, with no evidence of dilitation. Venous: The inferior vena cava is dilated in size with less than 50% respiratory variability, suggesting right atrial pressure of 15 mmHg. IAS/Shunts: No atrial level shunt detected by color flow Doppler.  LEFT VENTRICLE PLAX 2D LVIDd:         4.90 cm      Diastology LVIDs:         3.20 cm      LV e' medial:  3.60 cm/s LV PW:         1.50 cm      LV e' lateral: 5.80 cm/s LV IVS:        1.40 cm LVOT diam:     2.40 cm LV SV:         59 LV SV Index:   24 LVOT Area:     4.52 cm  LV Volumes (MOD) LV vol d, MOD A2C: 146.0 ml LV vol d, MOD A4C: 154.0 ml LV vol s, MOD A2C: 116.0 ml LV vol s, MOD A4C: 96.4 ml LV SV MOD A2C:     30.0 ml LV SV MOD A4C:     154.0 ml LV SV MOD BP:      45.4 ml RIGHT VENTRICLE RV S prime:     7.03 cm/s TAPSE (M-mode): 1.4 cm LEFT ATRIUM             Index LA diam:        6.10 cm 2.45 cm/m LA Vol (A2C):   86.4 ml 34.66 ml/m LA Vol (A4C):   77.5 ml 31.09 ml/m LA Biplane Vol: 83.4 ml 33.45 ml/m  AORTIC VALVE                    PULMONIC VALVE AV Area (Vmax):    2.23 cm     PV Vmax:       0.88 m/s AV Area (Vmean):   2.07 cm     PV Vmean:      72.400 cm/s AV Area (VTI):     2.10 cm     PV VTI:        0.160 m AV Vmax:           159.00 cm/s  PV Peak grad:  3.1 mmHg AV Vmean:          105.500 cm/s PV Mean grad:  2.0 mmHg AV VTI:            0.280 m AV Peak Grad:      10.1 mmHg AV Mean Grad:      5.0 mmHg LVOT Vmax:         78.30 cm/s LVOT Vmean:        48.200 cm/s LVOT VTI:          0.130 m LVOT/AV VTI ratio: 0.46 AI PHT:            336 msec  AORTA Ao Root diam: 3.50 cm Ao Asc diam:  3.40 cm  MITRAL VALVE              TRICUSPID VALVE MV Area (PHT): 3.76 cm   TR Peak grad:   40.2 mmHg MV Mean grad:  4.8 mmHg   TR Vmax:        317.00 cm/s MV Decel Time: 202 msec MR Peak grad: 45.2 mmHg   SHUNTS MR Mean grad: 30.0 mmHg   Systemic VTI:  0.13 m MR Vmax:  336.00 cm/s Systemic Diam: 2.40 cm MR Vmean:     259.0 cm/s Dani Gobble Croitoru MD Electronically signed by Sanda Klein MD Signature Date/Time: 02/15/2021/1:32:24 PM    Final    HYBRID OR IMAGING ([place])  Result Date: 02/14/2021 There is no interpretation for this exam.  This order is for images obtained during a surgical procedure.  Please See "Surgeries" Tab for more information regarding the procedure.        Scheduled Meds: . aspirin EC  81 mg Oral Daily  . atorvastatin  80 mg Oral Daily  . chlorhexidine  15 mL Mouth/Throat Once   Or  . mouth rinse  15 mL Mouth Rinse Once  . Chlorhexidine Gluconate Cloth  6 each Topical Daily  . clonazePAM  0.5 mg Oral BID  . heparin  5,000 Units Subcutaneous Q8H  . insulin aspart  0-5 Units Subcutaneous QHS  . insulin aspart  0-6 Units Subcutaneous TID WC  . insulin glargine  40 Units Subcutaneous BID  . midodrine  10 mg Oral TID with meals  . senna-docusate  2 tablet Oral BID  . warfarin  7.5 mg Oral ONCE-1600  . Warfarin - Pharmacist Dosing Inpatient   Does not apply q1600   Continuous Infusions:   LOS: 1 day       Hosie Poisson, MD [facility]   To contact the attending provider between 7A-7P or the covering provider during after hours 7P-7A, please log into the web site [email or web address] and access using universal [place] password for that web site. If you do not have the password, please call the hospital operator.  02/15/2021, 4:04 PM

## 2021-02-15 NOTE — Progress Notes (Signed)
Progress Note  Patient Name: [name] [name] Date of Encounter: 02/15/2021  Primary Cardiologist:   Lauree Chandler, MD   Subjective   No further chest pain.  No SOB.  Tolerating dialysis despite low BPs  Inpatient Medications    Scheduled Meds: . aspirin EC  81 mg Oral Daily  . atorvastatin  80 mg Oral Daily  . chlorhexidine  15 mL Mouth/Throat Once   Or  . mouth rinse  15 mL Mouth Rinse Once  . Chlorhexidine Gluconate Cloth  6 each Topical Daily  . clonazePAM  0.5 mg Oral BID  . heparin  5,000 Units Subcutaneous Q8H  . insulin aspart  0-5 Units Subcutaneous QHS  . insulin aspart  0-6 Units Subcutaneous TID WC  . insulin glargine  40 Units Subcutaneous BID  . midodrine  10 mg Oral TID with meals  . senna-docusate  2 tablet Oral BID  . warfarin  7.5 mg Oral ONCE-1600  . Warfarin - Pharmacist Dosing Inpatient   Does not apply q1600   Continuous Infusions:  PRN Meds: acetaminophen, bisacodyl, melatonin, nitroGLYCERIN, prochlorperazine   Vital Signs    Vitals:   02/15/21 1500 02/15/21 1515 02/15/21 1530 02/15/21 1600  BP: (!) 101/40 (!) 75/21 (!) 66/22 (!) 83/56  Pulse:   98 (!) 101  Resp: (!) 21 18 (!) 23 20  Temp:      TempSrc:      SpO2: 94%  98% 100%  Weight:      Height:        Intake/Output Summary (Last 24 hours) at 02/15/2021 1627 Last data filed at 02/15/2021 1300 Gross per 24 hour  Intake 580 ml  Output 175 ml  Net 405 ml   Filed Weights   02/13/21 2225 02/14/21 0437 02/15/21 0426  Weight: 130 kg 134.9 kg (!) 137.8 kg    Telemetry    NA  - Personally Reviewed  ECG    NA - Personally Reviewed  Physical Exam   GEN: No acute distress.   Neck: No  JVD Cardiac: RRR, no murmurs, rubs, or gallops.  Respiratory: Clear  to auscultation bilaterally. GI: Soft, nontender, non-distended  MS: No  edema; No deformity. Neuro:  Nonfocal  Psych: Normal affect   Labs    Chemistry Recent Labs  Lab 02/13/21 2240 02/14/21 0328 02/14/21 1356  02/15/21 0927  NA 137  --  138 136  K 4.7  --  5.1 5.6*  CL 97*  --  100 99  CO2 32  --   --  28  GLUCOSE 298*  --  104* 157*  BUN 36*  --  40* 26*  CREATININE 7.70* 7.81* 10.10* 6.51*  CALCIUM 8.6*  --   --  8.5*  GFRNONAA 8* 7*  --  9*  ANIONGAP 8  --   --  9     Hematology Recent Labs  Lab 02/13/21 2240 02/14/21 0328 02/14/21 1356 02/15/21 0927  WBC 8.2 7.4  --  6.7  RBC 3.91* 3.82*  --  3.77*  HGB 11.4* 11.2* 12.6* 11.1*  HCT 39.9 38.9* 37.0* 38.9*  MCV 102.0* 101.8*  --  103.2*  MCH 29.2 29.3  --  29.4  MCHC 28.6* 28.8*  --  28.5*  RDW 21.8* 21.4*  --  21.7*  PLT 223 219  --  207    Cardiac EnzymesNo results for input(s): TROPONINI in the last 168 hours. No results for input(s): TROPIPOC in the last 168 hours.   BNPNo results  for input(s): BNP, PROBNP in the last 168 hours.   DDimer No results for input(s): DDIMER in the last 168 hours.   Radiology    DG Chest 2 View  Result Date: 02/13/2021 CLINICAL DATA:  Chest pain EXAM: CHEST - 2 VIEW COMPARISON:  Radiograph 01/16/2021, CT 12/13/2007 FINDINGS: Tunneled right IJ approach dual lumen dialysis catheter tip terminates in the superior cavoatrial junction. Vascular stenting noted in the right axillary tissues. Telemetry leads overlie the chest. There is diffuse pulmonary vascular congestion with hazy and fine interstitial opacities throughout both lungs in a mid to lower lung predominance. Some peripheral septal lines are present as well. Cardiomegaly is similar to comparison exams accounting for differences in technique. The aorta is calcified. The remaining cardiomediastinal contours are unremarkable. No acute osseous or soft tissue abnormality. IMPRESSION: Features suggestive of CHF/volume overload with cardiomegaly, vascular congestion and pulmonary edema. Aortic Atherosclerosis (ICD10-I70.0). Electronically Signed   By: Lovena Le M.D.   On: 02/13/2021 23:14   DG Chest Port 1 View  Result Date: 02/14/2021 CLINICAL  DATA:  Catheter placement EXAM: PORTABLE CHEST 1 VIEW COMPARISON:  02/14/2021 FINDINGS: Right-sided central venous catheter tip over the mid right atrium. Incompletely visualized left lung base. Cardiomegaly with vascular congestion and mild edema. Aortic atherosclerosis. No pneumothorax. Upper normal mediastinal silhouette, likely due to portable technique and slight degree of patient rotation. IMPRESSION: 1. Right-sided central venous catheter tip over the mid right atrium. No pneumothorax. 2. Cardiomegaly with vascular congestion, mild pulmonary edema and probable pleural effusions. Electronically Signed   By: Donavan Foil M.D.   On: 02/14/2021 18:18   ECHOCARDIOGRAM COMPLETE  Result Date: 02/15/2021    ECHOCARDIOGRAM REPORT   Patient Name:   [name] [name] Date of Exam: 02/15/2021 Medical Rec #:  [id number]      Height:       70.0 in Accession #:    [id number]     Weight:       303.8 lb Date of Birth:  1966/08/05      BSA:          2.493 m Patient Age:    55 years       BP:           78/40 mmHg Patient Gender: M              HR:           90 bpm. Exam Location:  Inpatient Procedure: 2D Echo, Cardiac Doppler, Color Doppler and Intracardiac            Opacification Agent Indications:    Elevated troponin  History:        Patient has prior history of Echocardiogram examinations, most                 recent 07/29/2020. CHF, Previous Myocardial Infarction, COPD,                 Arrythmias:Atrial Fibrillation, Signs/Symptoms:Murmur; Risk                 Factors:Hypertension, Diabetes and Dyslipidemia.  Sonographer:    Luisa Hart RDCS Referring Phys: [id number] Hulen Shouts RHYNE  Sonographer Comments: 07/27/2020 cath IMPRESSIONS  1. Left ventricular ejection fraction, by estimation, is 25 to 30%. The left ventricle has severely decreased function. The left ventricle demonstrates regional wall motion abnormalities. There is global hypokinesis with disproportionately severe hypokinesis of the left ventricular, mid-apical  anteroseptal wall, anterior wall, apical segment and inferior wall. There is  moderate concentric left ventricular hypertrophy. Left ventricular diastolic function could not be evaluated.  2. Right ventricular systolic function is moderately reduced. The right ventricular size is moderately enlarged. There is moderately elevated pulmonary artery systolic pressure.  3. Left atrial size was severely dilated.  4. A small pericardial effusion is present. The pericardial effusion is circumferential. There is no evidence of cardiac tamponade.  5. The mitral valve is degenerative. Mild mitral valve regurgitation. Mild mitral stenosis. The mean mitral valve gradient is 4.8 mmHg. Severe mitral annular calcification.  6. Tricuspid valve regurgitation is moderate.  7. The aortic valve is tricuspid. There is mild calcification of the aortic valve. There is mild thickening of the aortic valve. Aortic valve regurgitation is mild. Mild aortic valve sclerosis is present, with no evidence of aortic valve stenosis.  8. The inferior vena cava is dilated in size with <50% respiratory variability, suggesting right atrial pressure of 15 mmHg. Comparison(s): The left ventricular function is significantly worse. The left ventricular wall motion abnormalities are significantly worse. There appears to be interval ischemia or infarction in the LAD artery distribution. FINDINGS  Left Ventricle: Left ventricular ejection fraction, by estimation, is 25 to 30%. The left ventricle has severely decreased function. The left ventricle demonstrates regional wall motion abnormalities. There is global hypokinesis with disproportionately severe hypokinesis of the left ventricular, mid-apical anteroseptal wall, anterior wall, apical segment and inferior wall. Definity contrast agent was given IV to delineate the left ventricular endocardial borders. The left ventricular internal cavity size was normal in size. There is moderate concentric left ventricular  hypertrophy. Abnormal (paradoxical) septal motion, consistent with left bundle branch block. Left ventricular diastolic function could not be evaluated due to atrial fibrillation. Left ventricular diastolic function could not be evaluated. Right Ventricle: The right ventricular size is moderately enlarged. No increase in right ventricular wall thickness. Right ventricular systolic function is moderately reduced. There is moderately elevated pulmonary artery systolic pressure. The tricuspid  regurgitant velocity is 3.17 m/s, and with an assumed right atrial pressure of 15 mmHg, the estimated right ventricular systolic pressure is 19.5 mmHg. Left Atrium: Left atrial size was severely dilated. Right Atrium: Right atrial size was normal in size. Pericardium: A small pericardial effusion is present. The pericardial effusion is circumferential. There is no evidence of cardiac tamponade. Mitral Valve: The mitral valve is degenerative in appearance. Severe mitral annular calcification. Mild mitral valve regurgitation. Mild mitral valve stenosis. The mean mitral valve gradient is 4.8 mmHg with average heart rate of 85 bpm. Tricuspid Valve: The tricuspid valve is normal in structure. Tricuspid valve regurgitation is moderate. Aortic Valve: The aortic valve is tricuspid. There is mild calcification of the aortic valve. There is mild thickening of the aortic valve. Aortic valve regurgitation is mild. Aortic regurgitation PHT measures 336 msec. Mild aortic valve sclerosis is present, with no evidence of aortic valve stenosis. Aortic valve mean gradient measures 5.0 mmHg. Aortic valve peak gradient measures 10.1 mmHg. Aortic valve area, by VTI measures 2.10 cm. Pulmonic Valve: The pulmonic valve was not well visualized. Pulmonic valve regurgitation is not visualized. Aorta: The aortic root and ascending aorta are structurally normal, with no evidence of dilitation. Venous: The inferior vena cava is dilated in size with less than  50% respiratory variability, suggesting right atrial pressure of 15 mmHg. IAS/Shunts: No atrial level shunt detected by color flow Doppler.  LEFT VENTRICLE PLAX 2D LVIDd:         4.90 cm      Diastology  LVIDs:         3.20 cm      LV e' medial:  3.60 cm/s LV PW:         1.50 cm      LV e' lateral: 5.80 cm/s LV IVS:        1.40 cm LVOT diam:     2.40 cm LV SV:         59 LV SV Index:   24 LVOT Area:     4.52 cm  LV Volumes (MOD) LV vol d, MOD A2C: 146.0 ml LV vol d, MOD A4C: 154.0 ml LV vol s, MOD A2C: 116.0 ml LV vol s, MOD A4C: 96.4 ml LV SV MOD A2C:     30.0 ml LV SV MOD A4C:     154.0 ml LV SV MOD BP:      45.4 ml RIGHT VENTRICLE RV S prime:     7.03 cm/s TAPSE (M-mode): 1.4 cm LEFT ATRIUM             Index LA diam:        6.10 cm 2.45 cm/m LA Vol (A2C):   86.4 ml 34.66 ml/m LA Vol (A4C):   77.5 ml 31.09 ml/m LA Biplane Vol: 83.4 ml 33.45 ml/m  AORTIC VALVE                    PULMONIC VALVE AV Area (Vmax):    2.23 cm     PV Vmax:       0.88 m/s AV Area (Vmean):   2.07 cm     PV Vmean:      72.400 cm/s AV Area (VTI):     2.10 cm     PV VTI:        0.160 m AV Vmax:           159.00 cm/s  PV Peak grad:  3.1 mmHg AV Vmean:          105.500 cm/s PV Mean grad:  2.0 mmHg AV VTI:            0.280 m AV Peak Grad:      10.1 mmHg AV Mean Grad:      5.0 mmHg LVOT Vmax:         78.30 cm/s LVOT Vmean:        48.200 cm/s LVOT VTI:          0.130 m LVOT/AV VTI ratio: 0.46 AI PHT:            336 msec  AORTA Ao Root diam: 3.50 cm Ao Asc diam:  3.40 cm MITRAL VALVE              TRICUSPID VALVE MV Area (PHT): 3.76 cm   TR Peak grad:   40.2 mmHg MV Mean grad:  4.8 mmHg   TR Vmax:        317.00 cm/s MV Decel Time: 202 msec MR Peak grad: 45.2 mmHg   SHUNTS MR Mean grad: 30.0 mmHg   Systemic VTI:  0.13 m MR Vmax:      336.00 cm/s Systemic Diam: 2.40 cm MR Vmean:     259.0 cm/s Dani Gobble Croitoru MD Electronically signed by Sanda Klein MD Signature Date/Time: 02/15/2021/1:32:24 PM    Final    HYBRID OR IMAGING ([facility] ONLY)  Result  Date: 02/14/2021 There is no interpretation for this exam.  This order is for images obtained during a surgical procedure.  Please See "Surgeries"  Tab for more information regarding the procedure.    Cardiac Studies   ECHO:  1. Left ventricular ejection fraction, by estimation, is 25 to 30%. The  left ventricle has severely decreased function. The left ventricle  demonstrates regional wall motion abnormalities. There is global  hypokinesis with disproportionately severe  hypokinesis of the left ventricular, mid-apical anteroseptal wall,  anterior wall, apical segment and inferior wall. There is moderate  concentric left ventricular hypertrophy. Left ventricular diastolic  function could not be evaluated.  2. Right ventricular systolic function is moderately reduced. The right  ventricular size is moderately enlarged. There is moderately elevated  pulmonary artery systolic pressure.  3. Left atrial size was severely dilated.  4. A small pericardial effusion is present. The pericardial effusion is  circumferential. There is no evidence of cardiac tamponade.  5. The mitral valve is degenerative. Mild mitral valve regurgitation.  Mild mitral stenosis. The mean mitral valve gradient is 4.8 mmHg. Severe  mitral annular calcification.  6. Tricuspid valve regurgitation is moderate.  7. The aortic valve is tricuspid. There is mild calcification of the  aortic valve. There is mild thickening of the aortic valve. Aortic valve  regurgitation is mild. Mild aortic valve sclerosis is present, with no  evidence of aortic valve stenosis.  8. The inferior vena cava is dilated in size with <50% respiratory  variability, suggesting right atrial pressure of 15 mmHg.   Patient Profile     55 y.o. male with a hx of ESRD on iHD, CAD s/p PCI 07/2020 who was seen 02/14/2021 for the evaluation of elevated troponin at the request of Dr. [place] Crane.  Assessment & Plan    CARDIOMYOPATHY:  EF is reduced compare  with previous (was 40 - 45%).   Unclear if he had a recent event.  He has had pain.  Elevated trop.  I will repeat to see the trend.  He likely will need another cardiac cath although, owing to the fact that this we cannot time this event and it is not in the last few hours, this can be considered electively on Monday.    ATRIAL FIB FLUTTER:  On long term anticoagulation.   Subtherapuetic.     I will stop the warfarin and start heparin.    For questions or updates, please contact [facility] Please consult [email or web address] for contact info under Cardiology/STEMI.   Signed, Minus Breeding, MD  02/15/2021, 4:27 PM

## 2021-02-15 NOTE — Progress Notes (Signed)
Patient taken to HD in bed.

## 2021-02-15 NOTE — Progress Notes (Signed)
*  PRELIMINARY RESULTS* Echocardiogram 2D Echocardiogram has been performed [place] 02/15/2021, 10:02 AM

## 2021-02-15 NOTE — Progress Notes (Signed)
ANTICOAGULATION CONSULT NOTE - Follow Up Consult  Pharmacy Consult for Warfarin Indication: atrial fibrillation  Allergies  Allergen Reactions  . Shellfish-Derived Products Anaphylaxis  . Ace Inhibitors Cough  . Hydrocodone Other (See Comments)    Hallucinations  . Pork-Derived Products Other (See Comments)    Entry determined to be clinically insignificant: OK TO GIVE HEPARIN (has tolerated well in the past)  Patient had allergic episode in 1989 after he ate pork at a fair. He developed fever and rash and went to the ED, but he did not have breathing issues. Received heparin in the past. Patient ok with removing pork allergy and listing as intolerance instead.    Patient Measurements: Height: 5\' 10"  (177.8 cm) Weight: (!) 137.8 kg (303 lb 12.7 oz) IBW/kg (Calculated) : 73 kg  Vital Signs: Temp: 97.5 F (36.4 C) (05/07 0804) Temp Source: Oral (05/07 0804) BP: 92/69 (05/07 0804) Pulse Rate: 100 (05/07 0804)  Labs: Recent Labs    02/13/21 2240 02/14/21 0024 02/14/21 0328 02/14/21 0551 02/14/21 1356 02/15/21 0927  HGB 11.4*  --  11.2*  --  12.6* 11.1*  HCT 39.9  --  38.9*  --  37.0* 38.9*  PLT 223  --  219  --   --  207  LABPROT 18.7*  --   --  18.4*  --  18.3*  INR 1.6*  --   --  1.5*  --  1.5*  CREATININE 7.70*  --  7.81*  --  10.10* 6.51*  TROPONINIHS 572* 576* 922* 1,335*  --   --     Estimated Creatinine Clearance: 17.5 mL/min (A) (by C-G formula based on SCr of 6.51 mg/dL (H)).  Assessment: 55 yo male with a history of atrial fibrillation presents with chest pain. PTA the patient is on warfarin for atrial fibrillation. INR upon admit was subtherapeutic at 1.6. Pharmacy is consulted to dose warfarin. - Warfarin PTA dose: 5 mg daily with INR goal 2-3 (per last anti-coag visit note and med history)   INR today is subtherapeutic at 1.5 after receiving 7.5 mg x1 dose yesterday. The patient is unable to recall when his last dose of warfarin was PTA, but thinks it was  within the past week. No signs or symptoms of bleeding are noted. Hgb is stable at 11.1, platelets 207. Will monitor the patient for new interacting medications and diet changes.   Goal of Therapy:  INR 2-3 Monitor platelets by anticoagulation protocol: Yes   Plan:  -Give warfarin PO 7.5 mg tonight x1 -Monitor daily PT/INR and q72h CBC -Monitor for signs and symptoms of bleeding   Shauna Hugh, PharmD, [place]  PGY-1 Pharmacy Resident 02/15/2021 10:57 AM  Please check [email or web address] for unit-specific pharmacy phone numbers.

## 2021-02-15 NOTE — Progress Notes (Signed)
ANTICOAGULATION CONSULT NOTE - Follow Up Consult  Pharmacy Consult for Warfarin to heparin Indication: atrial fibrillation  Allergies  Allergen Reactions  . Shellfish-Derived Products Anaphylaxis  . Ace Inhibitors Cough  . Hydrocodone Other (See Comments)    Hallucinations  . Pork-Derived Products Other (See Comments)    Entry determined to be clinically insignificant: OK TO GIVE HEPARIN (has tolerated well in the past)  Patient had allergic episode in 1989 after he ate pork at a fair. He developed fever and rash and went to the ED, but he did not have breathing issues. Received heparin in the past. Patient ok with removing pork allergy and listing as intolerance instead.    Patient Measurements: Height: 5\' 10"  (177.8 cm) Weight: (!) 137.8 kg (303 lb 12.7 oz) IBW/kg (Calculated) : 73 kg  Vital Signs: Temp: 97.9 F (36.6 C) (05/07 1114) Temp Source: Oral (05/07 1114) BP: 75/31 (05/07 1645) Pulse Rate: 99 (05/07 1630)  Labs: Recent Labs    02/13/21 2240 02/14/21 0024 02/14/21 0328 02/14/21 0551 02/14/21 1356 02/15/21 0927  HGB 11.4*  --  11.2*  --  12.6* 11.1*  HCT 39.9  --  38.9*  --  37.0* 38.9*  PLT 223  --  219  --   --  207  LABPROT 18.7*  --   --  18.4*  --  18.3*  INR 1.6*  --   --  1.5*  --  1.5*  CREATININE 7.70*  --  7.81*  --  10.10* 6.51*  TROPONINIHS 572* 576* 922* 1,335*  --   --     Estimated Creatinine Clearance: 17.5 mL/min (A) (by C-G formula based on SCr of 6.51 mg/dL (H)).  Assessment: 55 yo male with a history of atrial fibrillation presents with chest pain. PTA the patient is on warfarin for atrial fibrillation. INR upon admit was subtherapeutic at 1.6. Pharmacy is consulted to dose warfarin. - Warfarin PTA dose: 5 mg daily with INR goal 2-3 (per last anti-coag visit note and med history)   Pharmacy asked to transition to IV heparin with drop in EF and need for cardiac cath. Warfarin held, no dose given tonight yet, INR subtherapeutic today so  will go ahead and begin heparin. Will defer bolus in case INR trends up after boosted dose yesterday.  Goal of Therapy:  INR 2-3 Monitor platelets by anticoagulation protocol: Yes   Plan:  -Heparin 1200 units/h no bolus  -Check heparin level in 8h   Arrie Senate, PharmD, [place], [facility] Clinical Pharmacist [phone] Please check AMION for all [place] numbers 02/15/2021

## 2021-02-16 ENCOUNTER — Inpatient Hospital Stay (HOSPITAL_COMMUNITY): Payer: Medicare HMO

## 2021-02-16 DIAGNOSIS — Z992 Dependence on renal dialysis: Secondary | ICD-10-CM | POA: Diagnosis not present

## 2021-02-16 DIAGNOSIS — I429 Cardiomyopathy, unspecified: Secondary | ICD-10-CM | POA: Diagnosis not present

## 2021-02-16 DIAGNOSIS — R079 Chest pain, unspecified: Secondary | ICD-10-CM | POA: Diagnosis not present

## 2021-02-16 DIAGNOSIS — I4892 Unspecified atrial flutter: Secondary | ICD-10-CM | POA: Diagnosis not present

## 2021-02-16 DIAGNOSIS — Z95828 Presence of other vascular implants and grafts: Secondary | ICD-10-CM | POA: Diagnosis not present

## 2021-02-16 LAB — BASIC METABOLIC PANEL
Anion gap: 12 (ref 5–15)
BUN: 25 mg/dL — ABNORMAL HIGH (ref 6–20)
CO2: 22 mmol/L (ref 22–32)
Calcium: 8 mg/dL — ABNORMAL LOW (ref 8.9–10.3)
Chloride: 100 mmol/L (ref 98–111)
Creatinine, Ser: 5.46 mg/dL — ABNORMAL HIGH (ref 0.61–1.24)
GFR, Estimated: 11 mL/min — ABNORMAL LOW (ref >60)
Glucose, Bld: 142 mg/dL — ABNORMAL HIGH (ref 70–99)
Potassium: 5.2 mmol/L — ABNORMAL HIGH (ref 3.5–5.1)
Sodium: 134 mmol/L — ABNORMAL LOW (ref 135–145)

## 2021-02-16 LAB — HEPARIN LEVEL (UNFRACTIONATED)
Heparin Unfractionated: <0.1 IU/mL — ABNORMAL LOW (ref 0.30–0.70)
Heparin Unfractionated: <0.1 IU/mL — ABNORMAL LOW (ref 0.30–0.70)
Heparin Unfractionated: 0.17 IU/mL — ABNORMAL LOW (ref 0.30–0.70)

## 2021-02-16 LAB — PROTIME-INR
INR: 1.7 — ABNORMAL HIGH (ref 0.8–1.2)
Prothrombin Time: 19.8 seconds — ABNORMAL HIGH (ref 11.4–15.2)

## 2021-02-16 LAB — CBC
HCT: 39 % (ref 39.0–52.0)
Hemoglobin: 11.1 g/dL — ABNORMAL LOW (ref 13.0–17.0)
MCH: 29.6 pg (ref 26.0–34.0)
MCHC: 28.5 g/dL — ABNORMAL LOW (ref 30.0–36.0)
MCV: 104 fL — ABNORMAL HIGH (ref 80.0–100.0)
Platelets: 213 10*3/uL (ref 150–400)
RBC: 3.75 MIL/uL — ABNORMAL LOW (ref 4.22–5.81)
RDW: 21.6 % — ABNORMAL HIGH (ref 11.5–15.5)
WBC: 7.1 10*3/uL (ref 4.0–10.5)
nRBC: 0.3 % — ABNORMAL HIGH (ref 0.0–0.2)

## 2021-02-16 LAB — GLUCOSE, CAPILLARY
Glucose-Capillary: 129 mg/dL — ABNORMAL HIGH (ref 70–99)
Glucose-Capillary: 179 mg/dL — ABNORMAL HIGH (ref 70–99)
Glucose-Capillary: 134 mg/dL — ABNORMAL HIGH (ref 70–99)
Glucose-Capillary: 108 mg/dL — ABNORMAL HIGH (ref 70–99)

## 2021-02-16 LAB — TROPONIN I (HIGH SENSITIVITY): Troponin I (High Sensitivity): 656 ng/L (ref <18)

## 2021-02-16 MED ORDER — SODIUM CHLORIDE 0.9 % IV SOLN: INTRAVENOUS | Status: DC

## 2021-02-16 MED ORDER — SODIUM CHLORIDE 0.9% FLUSH: 3.0000 mL | INTRAVENOUS | Status: DC | PRN

## 2021-02-16 MED ORDER — ASPIRIN 81 MG PO CHEW
81.0000 mg | CHEWABLE_TABLET | ORAL | Status: AC
  Administered 2021-02-17: 81 mg via ORAL
  Filled 2021-02-16: qty 1

## 2021-02-16 MED ORDER — PATIROMER SORBITEX CALCIUM 8.4 G PO PACK
8.4000 g | PACK | Freq: Every day | ORAL | Status: DC
  Filled 2021-02-16: qty 1

## 2021-02-16 MED ORDER — FERRIC CITRATE 1 GM 210 MG(FE) PO TABS
420.0000 mg | ORAL_TABLET | Freq: Three times a day (TID) | ORAL | Status: DC
  Administered 2021-02-16 – 2021-02-18 (×7): 420 mg via ORAL
  Filled 2021-02-16 (×10): qty 2

## 2021-02-16 MED ORDER — FENTANYL CITRATE (PF) 100 MCG/2ML IJ SOLN
25.0000 ug | INTRAMUSCULAR | Status: DC | PRN
  Administered 2021-02-16 – 2021-02-17 (×2): 50 ug via INTRAVENOUS
  Filled 2021-02-16 (×2): qty 2

## 2021-02-16 MED ORDER — BISACODYL 10 MG RE SUPP: 10.0000 mg | Freq: Every day | RECTAL | Status: DC | PRN

## 2021-02-16 MED ORDER — SODIUM CHLORIDE 0.9% FLUSH: 3.0000 mL | Freq: Two times a day (BID) | INTRAVENOUS | Status: DC

## 2021-02-16 MED ORDER — SEVELAMER CARBONATE 800 MG PO TABS
1600.0000 mg | ORAL_TABLET | Freq: Three times a day (TID) | ORAL | Status: DC
  Administered 2021-02-16 – 2021-02-18 (×6): 1600 mg via ORAL
  Filled 2021-02-16 (×7): qty 2

## 2021-02-16 MED ORDER — CINACALCET HCL 30 MG PO TABS
30.0000 mg | ORAL_TABLET | Freq: Every day | ORAL | Status: DC
  Administered 2021-02-16 – 2021-02-17 (×2): 30 mg via ORAL
  Filled 2021-02-16 (×3): qty 1

## 2021-02-16 MED ORDER — SODIUM CHLORIDE 0.9 % IV SOLN: 250.0000 mL | INTRAVENOUS | Status: DC | PRN

## 2021-02-16 MED ORDER — SODIUM ZIRCONIUM CYCLOSILICATE 10 G PO PACK
10.0000 g | PACK | Freq: Every day | ORAL | Status: DC
  Filled 2021-02-16: qty 1

## 2021-02-16 MED ORDER — LOPERAMIDE HCL 2 MG PO CAPS
2.0000 mg | ORAL_CAPSULE | ORAL | Status: DC | PRN
  Administered 2021-02-16 – 2021-02-18 (×2): 2 mg via ORAL
  Filled 2021-02-16 (×2): qty 1

## 2021-02-16 MED ORDER — LORAZEPAM 2 MG/ML IJ SOLN
0.5000 mg | INTRAMUSCULAR | Status: DC | PRN
  Administered 2021-02-16: 0.5 mg via INTRAVENOUS
  Filled 2021-02-16 (×2): qty 1

## 2021-02-16 NOTE — Progress Notes (Signed)
ANTICOAGULATION CONSULT NOTE - Follow Up Consult  Pharmacy Consult for Warfarin Indication: atrial fibrillation  Allergies  Allergen Reactions  . Shellfish-Derived Products Anaphylaxis  . Ace Inhibitors Cough  . Hydrocodone Other (See Comments)    Hallucinations  . Pork-Derived Products Other (See Comments)    Entry determined to be clinically insignificant: OK TO GIVE HEPARIN (has tolerated well in the past)  Patient had allergic episode in 1989 after he ate pork at a fair. He developed fever and rash and went to the ED, but he did not have breathing issues. Received heparin in the past. Patient ok with removing pork allergy and listing as intolerance instead.    Patient Measurements: Height: 5\' 10"  (177.8 cm) Weight: (!) 137.8 kg (303 lb 12.7 oz) IBW/kg (Calculated) : 73 kg Heparin Dosing Weight: 104.3 kg  Vital Signs: Temp: 98.1 F (36.7 C) (05/08 0745) Temp Source: Oral (05/08 0745) BP: 100/89 (05/08 1125) Pulse Rate: 100 (05/08 0745)  Labs: Recent Labs    02/14/21 0328 02/14/21 0551 02/14/21 1356 02/15/21 0927 02/15/21 1634 02/16/21 0608 02/16/21 0731 02/16/21 0734 02/16/21 1400  HGB 11.2*  --  12.6* 11.1*  --  11.1*  --   --   --   HCT 38.9*  --  37.0* 38.9*  --  39.0  --   --   --   PLT 219  --   --  207  --  213  --   --   --   LABPROT  --  18.4*  --  18.3*  --  19.8*  --   --   --   INR  --  1.5*  --  1.5*  --  1.7*  --   --   --   HEPARINUNFRC  --   --   --   --   --  <0.10*  --   --  <0.10*  CREATININE 7.81*  --  10.10* 6.51*  --   --   --  5.46*  --   TROPONINIHS 922* 1,335*  --   --  950*  --  656*  --   --     Estimated Creatinine Clearance: 20.9 mL/min (A) (by C-G formula based on SCr of 5.46 mg/dL (H)).  Assessment: 55 yo male with a history of atrial fibrillation presents with chest pain. PTA the patient is on warfarin for atrial fibrillation. INR upon admit was subtherapeutic at 1.6. Pharmacy was consulted to switch patient from warfarin to  heparin due to the patient's drop in EF and need for cardiac cath.  - Warfarin PTA dose: 5 mg daily with INR goal 2-3 (per last anti-coag visit note and med history)   Heparin level is undetectable at <0.10 while running at 1650 units/hr. However, the infusion was turned off from 1300-1430 due to access issues. Despite the infusion being turned off, the heparin level would likely still be subtherapeutic. There are no signs or symptoms of bleeding noted. CBC is stable.   Goal of Therapy:  INR 2-3 Monitor platelets by anticoagulation protocol: Yes   Plan:  -Increase heparin IV to 1900 units/hr -Check 6-hr heparin level -Obtain daily heparin level and CBC -Monitor for signs and symptoms of bleeding   Shauna Hugh, PharmD, [place]  PGY-1 Pharmacy Resident 02/16/2021 2:57 PM  Please check [email or web address] for unit-specific pharmacy phone numbers.

## 2021-02-16 NOTE — Progress Notes (Signed)
Progress Note  Patient Name: [name] [name] Date of Encounter: 02/16/2021  Primary Cardiologist:   Lauree Chandler, MD   Subjective   He had some right sided chest pain today.  Currently no pain.  Denies SOB.  Very pleasant  Inpatient Medications    Scheduled Meds: . aspirin EC  81 mg Oral Daily  . atorvastatin  80 mg Oral Daily  . chlorhexidine  15 mL Mouth/Throat Once   Or  . mouth rinse  15 mL Mouth Rinse Once  . Chlorhexidine Gluconate Cloth  6 each Topical Daily  . cinacalcet  30 mg Oral Q supper  . clonazePAM  0.5 mg Oral BID  . ferric citrate  420 mg Oral TID WC  . insulin aspart  0-5 Units Subcutaneous QHS  . insulin aspart  0-6 Units Subcutaneous TID WC  . insulin glargine  40 Units Subcutaneous BID  . midodrine  10 mg Oral TID with meals  . patiromer  8.4 g Oral Daily  . senna-docusate  2 tablet Oral BID  . sevelamer carbonate  1,600 mg Oral TID WC   Continuous Infusions: . heparin 1,650 Units/hr (02/16/21 0717)   PRN Meds: acetaminophen, bisacodyl, fentaNYL (SUBLIMAZE) injection, LORazepam, melatonin, nitroGLYCERIN, prochlorperazine   Vital Signs    Vitals:   02/16/21 0102 02/16/21 0713 02/16/21 0733 02/16/21 0745  BP: (!) 103/50 (!) 84/59 (!) 95/49 (!) 95/49  Pulse: 92 (!) 102 (!) 101 100  Resp: 19   20  Temp: 98.1 F (36.7 C) 98.1 F (36.7 C)  98.1 F (36.7 C)  TempSrc: Oral Oral  Oral  SpO2: 99% 91% 96% 97%  Weight:      Height:        Intake/Output Summary (Last 24 hours) at 02/16/2021 1103 Last data filed at 02/16/2021 0421 Gross per 24 hour  Intake 582.08 ml  Output 1700 ml  Net -1117.92 ml   Filed Weights   02/13/21 2225 02/14/21 0437 02/15/21 0426  Weight: 130 kg 134.9 kg (!) 137.8 kg    Telemetry    Atrial fib.  Rate is about 100 or lower.  Controlled for the most part - Personally Reviewed  ECG    Atrial fib with LBBB.  No change from previous - Personally Reviewed  Physical Exam   GEN: No acute distress.   Neck: No   JVD Cardiac: Irreuglar RR, no murmurs, rubs, or gallops.  Respiratory: Clear to auscultation bilaterally. GI: Soft, nontender, non-distended  MS: No  edema; No deformity. Neuro:  Nonfocal  Psych: Normal affect   Labs    Chemistry Recent Labs  Lab 02/13/21 2240 02/14/21 0328 02/14/21 1356 02/15/21 0927 02/16/21 0734  NA 137  --  138 136 134*  K 4.7  --  5.1 5.6* 5.2*  CL 97*  --  100 99 100  CO2 32  --   --  28 22  GLUCOSE 298*  --  104* 157* 142*  BUN 36*  --  40* 26* 25*  CREATININE 7.70* 7.81* 10.10* 6.51* 5.46*  CALCIUM 8.6*  --   --  8.5* 8.0*  GFRNONAA 8* 7*  --  9* 11*  ANIONGAP 8  --   --  9 12     Hematology Recent Labs  Lab 02/14/21 0328 02/14/21 1356 02/15/21 0927 02/16/21 0608  WBC 7.4  --  6.7 7.1  RBC 3.82*  --  3.77* 3.75*  HGB 11.2* 12.6* 11.1* 11.1*  HCT 38.9* 37.0* 38.9* 39.0  MCV 101.8*  --  103.2* 104.0*  MCH 29.3  --  29.4 29.6  MCHC 28.8*  --  28.5* 28.5*  RDW 21.4*  --  21.7* 21.6*  PLT 219  --  207 213    Cardiac EnzymesNo results for input(s): TROPONINI in the last 168 hours. No results for input(s): TROPIPOC in the last 168 hours.   BNPNo results for input(s): BNP, PROBNP in the last 168 hours.   DDimer No results for input(s): DDIMER in the last 168 hours.   Radiology    DG CHEST PORT 1 VIEW  Result Date: 02/16/2021 CLINICAL DATA:  Short of breath, chest pains EXAM: PORTABLE CHEST 1 VIEW COMPARISON:  Prior chest x-ray 02/15/2021 FINDINGS: Well-positioned right IJ tunneled hemodialysis catheter. Catheter tip overlies the mid right atrium. Stable cardiomegaly and mediastinal contours. Atherosclerotic calcifications again noted in the transverse aorta. Similar appearance of pulmonary vascular congestion bordering on mild edema. No evidence of pneumothorax. No large pleural effusion. No acute osseous abnormality. Metallic stents present in the right upper extremity axillary vein. IMPRESSION: 1. Cardiomegaly and pulmonary vascular  congestion bordering on mild edema appears similar compared to 02/14/2021. 2. Stable and except will position of right IJ tunneled hemodialysis catheter. 3. No evidence of pneumothorax. Electronically Signed   By: Jacqulynn Cadet M.D.   On: 02/16/2021 10:04   DG Chest Port 1 View  Result Date: 02/14/2021 CLINICAL DATA:  Catheter placement EXAM: PORTABLE CHEST 1 VIEW COMPARISON:  02/14/2021 FINDINGS: Right-sided central venous catheter tip over the mid right atrium. Incompletely visualized left lung base. Cardiomegaly with vascular congestion and mild edema. Aortic atherosclerosis. No pneumothorax. Upper normal mediastinal silhouette, likely due to portable technique and slight degree of patient rotation. IMPRESSION: 1. Right-sided central venous catheter tip over the mid right atrium. No pneumothorax. 2. Cardiomegaly with vascular congestion, mild pulmonary edema and probable pleural effusions. Electronically Signed   By: Donavan Foil M.D.   On: 02/14/2021 18:18   ECHOCARDIOGRAM COMPLETE  Result Date: 02/15/2021    ECHOCARDIOGRAM REPORT   Patient Name:   [name] [name] Date of Exam: 02/15/2021 Medical Rec #:  [id number]      Height:       70.0 in Accession #:    [id number]     Weight:       303.8 lb Date of Birth:  07-20-1966      BSA:          2.493 m Patient Age:    55 years       BP:           78/40 mmHg Patient Gender: M              HR:           90 bpm. Exam Location:  Inpatient Procedure: 2D Echo, Cardiac Doppler, Color Doppler and Intracardiac            Opacification Agent Indications:    Elevated troponin  History:        Patient has prior history of Echocardiogram examinations, most                 recent 07/29/2020. CHF, Previous Myocardial Infarction, COPD,                 Arrythmias:Atrial Fibrillation, Signs/Symptoms:Murmur; Risk                 Factors:Hypertension, Diabetes and Dyslipidemia.  Sonographer:    Luisa Hart RDCS Referring Phys: [place] Comments:  07/27/2020 cath IMPRESSIONS  1. Left ventricular ejection fraction, by estimation, is 25 to 30%. The left ventricle has severely decreased function. The left ventricle demonstrates regional wall motion abnormalities. There is global hypokinesis with disproportionately severe hypokinesis of the left ventricular, mid-apical anteroseptal wall, anterior wall, apical segment and inferior wall. There is moderate concentric left ventricular hypertrophy. Left ventricular diastolic function could not be evaluated.  2. Right ventricular systolic function is moderately reduced. The right ventricular size is moderately enlarged. There is moderately elevated pulmonary artery systolic pressure.  3. Left atrial size was severely dilated.  4. A small pericardial effusion is present. The pericardial effusion is circumferential. There is no evidence of cardiac tamponade.  5. The mitral valve is degenerative. Mild mitral valve regurgitation. Mild mitral stenosis. The mean mitral valve gradient is 4.8 mmHg. Severe mitral annular calcification.  6. Tricuspid valve regurgitation is moderate.  7. The aortic valve is tricuspid. There is mild calcification of the aortic valve. There is mild thickening of the aortic valve. Aortic valve regurgitation is mild. Mild aortic valve sclerosis is present, with no evidence of aortic valve stenosis.  8. The inferior vena cava is dilated in size with <50% respiratory variability, suggesting right atrial pressure of 15 mmHg. Comparison(s): The left ventricular function is significantly worse. The left ventricular wall motion abnormalities are significantly worse. There appears to be interval ischemia or infarction in the LAD artery distribution. FINDINGS  Left Ventricle: Left ventricular ejection fraction, by estimation, is 25 to 30%. The left ventricle has severely decreased function. The left ventricle demonstrates regional wall motion abnormalities. There is global hypokinesis with disproportionately  severe hypokinesis of the left ventricular, mid-apical anteroseptal wall, anterior wall, apical segment and inferior wall. Definity contrast agent was given IV to delineate the left ventricular endocardial borders. The left ventricular internal cavity size was normal in size. There is moderate concentric left ventricular hypertrophy. Abnormal (paradoxical) septal motion, consistent with left bundle branch block. Left ventricular diastolic function could not be evaluated due to atrial fibrillation. Left ventricular diastolic function could not be evaluated. Right Ventricle: The right ventricular size is moderately enlarged. No increase in right ventricular wall thickness. Right ventricular systolic function is moderately reduced. There is moderately elevated pulmonary artery systolic pressure. The tricuspid  regurgitant velocity is 3.17 m/s, and with an assumed right atrial pressure of 15 mmHg, the estimated right ventricular systolic pressure is 23.5 mmHg. Left Atrium: Left atrial size was severely dilated. Right Atrium: Right atrial size was normal in size. Pericardium: A small pericardial effusion is present. The pericardial effusion is circumferential. There is no evidence of cardiac tamponade. Mitral Valve: The mitral valve is degenerative in appearance. Severe mitral annular calcification. Mild mitral valve regurgitation. Mild mitral valve stenosis. The mean mitral valve gradient is 4.8 mmHg with average heart rate of 85 bpm. Tricuspid Valve: The tricuspid valve is normal in structure. Tricuspid valve regurgitation is moderate. Aortic Valve: The aortic valve is tricuspid. There is mild calcification of the aortic valve. There is mild thickening of the aortic valve. Aortic valve regurgitation is mild. Aortic regurgitation PHT measures 336 msec. Mild aortic valve sclerosis is present, with no evidence of aortic valve stenosis. Aortic valve mean gradient measures 5.0 mmHg. Aortic valve peak gradient measures 10.1  mmHg. Aortic valve area, by VTI measures 2.10 cm. Pulmonic Valve: The pulmonic valve was not well visualized. Pulmonic valve regurgitation is not visualized. Aorta: The aortic root and ascending aorta are structurally normal, with no evidence of dilitation.  Venous: The inferior vena cava is dilated in size with less than 50% respiratory variability, suggesting right atrial pressure of 15 mmHg. IAS/Shunts: No atrial level shunt detected by color flow Doppler.  LEFT VENTRICLE PLAX 2D LVIDd:         4.90 cm      Diastology LVIDs:         3.20 cm      LV e' medial:  3.60 cm/s LV PW:         1.50 cm      LV e' lateral: 5.80 cm/s LV IVS:        1.40 cm LVOT diam:     2.40 cm LV SV:         59 LV SV Index:   24 LVOT Area:     4.52 cm  LV Volumes (MOD) LV vol d, MOD A2C: 146.0 ml LV vol d, MOD A4C: 154.0 ml LV vol s, MOD A2C: 116.0 ml LV vol s, MOD A4C: 96.4 ml LV SV MOD A2C:     30.0 ml LV SV MOD A4C:     154.0 ml LV SV MOD BP:      45.4 ml RIGHT VENTRICLE RV S prime:     7.03 cm/s TAPSE (M-mode): 1.4 cm LEFT ATRIUM             Index LA diam:        6.10 cm 2.45 cm/m LA Vol (A2C):   86.4 ml 34.66 ml/m LA Vol (A4C):   77.5 ml 31.09 ml/m LA Biplane Vol: 83.4 ml 33.45 ml/m  AORTIC VALVE                    PULMONIC VALVE AV Area (Vmax):    2.23 cm     PV Vmax:       0.88 m/s AV Area (Vmean):   2.07 cm     PV Vmean:      72.400 cm/s AV Area (VTI):     2.10 cm     PV VTI:        0.160 m AV Vmax:           159.00 cm/s  PV Peak grad:  3.1 mmHg AV Vmean:          105.500 cm/s PV Mean grad:  2.0 mmHg AV VTI:            0.280 m AV Peak Grad:      10.1 mmHg AV Mean Grad:      5.0 mmHg LVOT Vmax:         78.30 cm/s LVOT Vmean:        48.200 cm/s LVOT VTI:          0.130 m LVOT/AV VTI ratio: 0.46 AI PHT:            336 msec  AORTA Ao Root diam: 3.50 cm Ao Asc diam:  3.40 cm MITRAL VALVE              TRICUSPID VALVE MV Area (PHT): 3.76 cm   TR Peak grad:   40.2 mmHg MV Mean grad:  4.8 mmHg   TR Vmax:        317.00 cm/s MV Decel  Time: 202 msec MR Peak grad: 45.2 mmHg   SHUNTS MR Mean grad: 30.0 mmHg   Systemic VTI:  0.13 m MR Vmax:      336.00 cm/s Systemic Diam: 2.40 cm MR Vmean:  259.0 cm/s Sanda Klein MD Electronically signed by Sanda Klein MD Signature Date/Time: 02/15/2021/1:32:24 PM    Final    HYBRID OR IMAGING ([place])  Result Date: 02/14/2021 There is no interpretation for this exam.  This order is for images obtained during a surgical procedure.  Please See "Surgeries" Tab for more information regarding the procedure.    Cardiac Studies   Echo  1. Left ventricular ejection fraction, by estimation, is 25 to 30%. The  left ventricle has severely decreased function. The left ventricle  demonstrates regional wall motion abnormalities. There is global  hypokinesis with disproportionately severe  hypokinesis of the left ventricular, mid-apical anteroseptal wall,  anterior wall, apical segment and inferior wall. There is moderate  concentric left ventricular hypertrophy. Left ventricular diastolic  function could not be evaluated.  2. Right ventricular systolic function is moderately reduced. The right  ventricular size is moderately enlarged. There is moderately elevated  pulmonary artery systolic pressure.  3. Left atrial size was severely dilated.  4. A small pericardial effusion is present. The pericardial effusion is  circumferential. There is no evidence of cardiac tamponade.  5. The mitral valve is degenerative. Mild mitral valve regurgitation.  Mild mitral stenosis. The mean mitral valve gradient is 4.8 mmHg. Severe  mitral annular calcification.  6. Tricuspid valve regurgitation is moderate.  7. The aortic valve is tricuspid. There is mild calcification of the  aortic valve. There is mild thickening of the aortic valve. Aortic valve  regurgitation is mild. Mild aortic valve sclerosis is present, with no  evidence of aortic valve stenosis.  8. The inferior vena cava is dilated in  size with <50% respiratory  variability, suggesting right atrial pressure of 15 mmHg.   Patient Profile     55 y.o. male with a hx of ESRD on iHD, CAD s/p PCI 10/2021who was seen 5/6/2022for the evaluation of elevated troponinat the request of Dr. [place] Crane.   Assessment & Plan    CARDIOMYOPATHY:  EF is reduced compare with previous (was 40 - 45%). Troponin trending down.  He has had some chest pain that does not sound anginal but with ongoing pain, trop, and newly lower EF cath is indicated.  I have discussed this with him.  It would need to be a femoral case.  He had no issues with his previous cath.    ATRIAL FIB FLUTTER:  On long term anticoagulation.   Subtherapuetic.     I will stop the warfarin and start heparin.    For questions or updates, please contact [place] Please consult [email or web address] for contact info under Cardiology/STEMI.   Signed, Minus Breeding, MD  02/16/2021, 11:03 AM

## 2021-02-16 NOTE — Progress Notes (Signed)
[facility] Progress Note   Subjective:  Seen in room - did fine with HD last night, was still hypotensive - net UF 1.7L. Denies dyspnea this morning, although using O2. Echo yesterday showed worsened EF - cardiology following with plan for possible LHC tomorrow. He is concerned that this will interfere with planned central venogram on Tuesday morning -> had talk about how heart evaluation is more critical.   Objective Vitals:   02/16/21 0102 02/16/21 0713 02/16/21 0733 02/16/21 0745  BP: (!) 103/50 (!) 84/59 (!) 95/49 (!) 95/49  Pulse: 92 (!) 102 (!) 101 100  Resp: 19   20  Temp: 98.1 F (36.7 C) 98.1 F (36.7 C)  98.1 F (36.7 C)  TempSrc: Oral Oral  Oral  SpO2: 99% 91% 96% 97%  Weight:      Height:       Physical Exam General: Well appearing man, NAD. Nasal O2 in place. Heart: RRR; no murmur Lungs: CTAB Abdomen: soft, non-tender Extremities: No LE edema Dialysis Access: new [facility] R chest  Additional Objective Labs: Basic Metabolic Panel: Recent Labs  Lab 02/13/21 2240 02/14/21 0328 02/14/21 1356 02/15/21 0927 02/16/21 0734  NA 137  --  138 136 134*  K 4.7  --  5.1 5.6* 5.2*  CL 97*  --  100 99 100  CO2 32  --   --  28 22  GLUCOSE 298*  --  104* 157* 142*  BUN 36*  --  40* 26* 25*  CREATININE 7.70*   < > 10.10* 6.51* 5.46*  CALCIUM 8.6*  --   --  8.5* 8.0*  PHOS  --   --   --  5.2*  --    < > = values in this interval not displayed.   CBC: Recent Labs  Lab 02/13/21 2240 02/14/21 0328 02/14/21 1356 02/15/21 0927 02/16/21 0608  WBC 8.2 7.4  --  6.7 7.1  HGB 11.4* 11.2* 12.6* 11.1* 11.1*  HCT 39.9 38.9* 37.0* 38.9* 39.0  MCV 102.0* 101.8*  --  103.2* 104.0*  PLT 223 219  --  207 213   CBG: Recent Labs  Lab 02/15/21 0732 02/15/21 1118 02/15/21 1846 02/15/21 2131 02/16/21 0748  GLUCAP 123* 178* 199* 225* 129*   Studies/Results: DG Chest Port 1 View  Result Date: 02/14/2021 CLINICAL DATA:  Catheter placement EXAM: PORTABLE CHEST 1  VIEW COMPARISON:  02/14/2021 FINDINGS: Right-sided central venous catheter tip over the mid right atrium. Incompletely visualized left lung base. Cardiomegaly with vascular congestion and mild edema. Aortic atherosclerosis. No pneumothorax. Upper normal mediastinal silhouette, likely due to portable technique and slight degree of patient rotation. IMPRESSION: 1. Right-sided central venous catheter tip over the mid right atrium. No pneumothorax. 2. Cardiomegaly with vascular congestion, mild pulmonary edema and probable pleural effusions. Electronically Signed   By: Donavan Foil M.D.   On: 02/14/2021 18:18   ECHOCARDIOGRAM COMPLETE  Result Date: 02/15/2021    ECHOCARDIOGRAM REPORT   Patient Name:   [name] [name] Date of Exam: 02/15/2021 Medical Rec #:  [id number]      Height:       70.0 in Accession #:    [id number]     Weight:       303.8 lb Date of Birth:  09-30-1966      BSA:          2.493 m Patient Age:    55 years       BP:  78/40 mmHg Patient Gender: M              HR:           90 bpm. Exam Location:  Inpatient Procedure: 2D Echo, Cardiac Doppler, Color Doppler and Intracardiac            Opacification Agent Indications:    Elevated troponin  History:        Patient has prior history of Echocardiogram examinations, most                 recent 07/29/2020. CHF, Previous Myocardial Infarction, COPD,                 Arrythmias:Atrial Fibrillation, Signs/Symptoms:Murmur; Risk                 Factors:Hypertension, Diabetes and Dyslipidemia.  Sonographer:    Luisa Hart RDCS Referring Phys: [id number] Hulen Shouts RHYNE  Sonographer Comments: 07/27/2020 cath IMPRESSIONS  1. Left ventricular ejection fraction, by estimation, is 25 to 30%. The left ventricle has severely decreased function. The left ventricle demonstrates regional wall motion abnormalities. There is global hypokinesis with disproportionately severe hypokinesis of the left ventricular, mid-apical anteroseptal wall, anterior wall, apical segment and  inferior wall. There is moderate concentric left ventricular hypertrophy. Left ventricular diastolic function could not be evaluated.  2. Right ventricular systolic function is moderately reduced. The right ventricular size is moderately enlarged. There is moderately elevated pulmonary artery systolic pressure.  3. Left atrial size was severely dilated.  4. A small pericardial effusion is present. The pericardial effusion is circumferential. There is no evidence of cardiac tamponade.  5. The mitral valve is degenerative. Mild mitral valve regurgitation. Mild mitral stenosis. The mean mitral valve gradient is 4.8 mmHg. Severe mitral annular calcification.  6. Tricuspid valve regurgitation is moderate.  7. The aortic valve is tricuspid. There is mild calcification of the aortic valve. There is mild thickening of the aortic valve. Aortic valve regurgitation is mild. Mild aortic valve sclerosis is present, with no evidence of aortic valve stenosis.  8. The inferior vena cava is dilated in size with <50% respiratory variability, suggesting right atrial pressure of 15 mmHg. Comparison(s): The left ventricular function is significantly worse. The left ventricular wall motion abnormalities are significantly worse. There appears to be interval ischemia or infarction in the LAD artery distribution. FINDINGS  Left Ventricle: Left ventricular ejection fraction, by estimation, is 25 to 30%. The left ventricle has severely decreased function. The left ventricle demonstrates regional wall motion abnormalities. There is global hypokinesis with disproportionately severe hypokinesis of the left ventricular, mid-apical anteroseptal wall, anterior wall, apical segment and inferior wall. Definity contrast agent was given IV to delineate the left ventricular endocardial borders. The left ventricular internal cavity size was normal in size. There is moderate concentric left ventricular hypertrophy. Abnormal (paradoxical) septal motion,  consistent with left bundle branch block. Left ventricular diastolic function could not be evaluated due to atrial fibrillation. Left ventricular diastolic function could not be evaluated. Right Ventricle: The right ventricular size is moderately enlarged. No increase in right ventricular wall thickness. Right ventricular systolic function is moderately reduced. There is moderately elevated pulmonary artery systolic pressure. The tricuspid  regurgitant velocity is 3.17 m/s, and with an assumed right atrial pressure of 15 mmHg, the estimated right ventricular systolic pressure is 65.0 mmHg. Left Atrium: Left atrial size was severely dilated. Right Atrium: Right atrial size was normal in size. Pericardium: A small pericardial effusion is present. The  pericardial effusion is circumferential. There is no evidence of cardiac tamponade. Mitral Valve: The mitral valve is degenerative in appearance. Severe mitral annular calcification. Mild mitral valve regurgitation. Mild mitral valve stenosis. The mean mitral valve gradient is 4.8 mmHg with average heart rate of 85 bpm. Tricuspid Valve: The tricuspid valve is normal in structure. Tricuspid valve regurgitation is moderate. Aortic Valve: The aortic valve is tricuspid. There is mild calcification of the aortic valve. There is mild thickening of the aortic valve. Aortic valve regurgitation is mild. Aortic regurgitation PHT measures 336 msec. Mild aortic valve sclerosis is present, with no evidence of aortic valve stenosis. Aortic valve mean gradient measures 5.0 mmHg. Aortic valve peak gradient measures 10.1 mmHg. Aortic valve area, by VTI measures 2.10 cm. Pulmonic Valve: The pulmonic valve was not well visualized. Pulmonic valve regurgitation is not visualized. Aorta: The aortic root and ascending aorta are structurally normal, with no evidence of dilitation. Venous: The inferior vena cava is dilated in size with less than 50% respiratory variability, suggesting right  atrial pressure of 15 mmHg. IAS/Shunts: No atrial level shunt detected by color flow Doppler.  LEFT VENTRICLE PLAX 2D LVIDd:         4.90 cm      Diastology LVIDs:         3.20 cm      LV e' medial:  3.60 cm/s LV PW:         1.50 cm      LV e' lateral: 5.80 cm/s LV IVS:        1.40 cm LVOT diam:     2.40 cm LV SV:         59 LV SV Index:   24 LVOT Area:     4.52 cm  LV Volumes (MOD) LV vol d, MOD A2C: 146.0 ml LV vol d, MOD A4C: 154.0 ml LV vol s, MOD A2C: 116.0 ml LV vol s, MOD A4C: 96.4 ml LV SV MOD A2C:     30.0 ml LV SV MOD A4C:     154.0 ml LV SV MOD BP:      45.4 ml RIGHT VENTRICLE RV S prime:     7.03 cm/s TAPSE (M-mode): 1.4 cm LEFT ATRIUM             Index LA diam:        6.10 cm 2.45 cm/m LA Vol (A2C):   86.4 ml 34.66 ml/m LA Vol (A4C):   77.5 ml 31.09 ml/m LA Biplane Vol: 83.4 ml 33.45 ml/m  AORTIC VALVE                    PULMONIC VALVE AV Area (Vmax):    2.23 cm     PV Vmax:       0.88 m/s AV Area (Vmean):   2.07 cm     PV Vmean:      72.400 cm/s AV Area (VTI):     2.10 cm     PV VTI:        0.160 m AV Vmax:           159.00 cm/s  PV Peak grad:  3.1 mmHg AV Vmean:          105.500 cm/s PV Mean grad:  2.0 mmHg AV VTI:            0.280 m AV Peak Grad:      10.1 mmHg AV Mean Grad:      5.0 mmHg LVOT  Vmax:         78.30 cm/s LVOT Vmean:        48.200 cm/s LVOT VTI:          0.130 m LVOT/AV VTI ratio: 0.46 AI PHT:            336 msec  AORTA Ao Root diam: 3.50 cm Ao Asc diam:  3.40 cm MITRAL VALVE              TRICUSPID VALVE MV Area (PHT): 3.76 cm   TR Peak grad:   40.2 mmHg MV Mean grad:  4.8 mmHg   TR Vmax:        317.00 cm/s MV Decel Time: 202 msec MR Peak grad: 45.2 mmHg   SHUNTS MR Mean grad: 30.0 mmHg   Systemic VTI:  0.13 m MR Vmax:      336.00 cm/s Systemic Diam: 2.40 cm MR Vmean:     259.0 cm/s Dani Gobble Croitoru MD Electronically signed by Sanda Klein MD Signature Date/Time: 02/15/2021/1:32:24 PM    Final    HYBRID OR IMAGING ([place])  Result Date: 02/14/2021 There is no interpretation  for this exam.  This order is for images obtained during a surgical procedure.  Please See "Surgeries" Tab for more information regarding the procedure.   Medications: . heparin 1,650 Units/hr (02/16/21 0717)   . aspirin EC  81 mg Oral Daily  . atorvastatin  80 mg Oral Daily  . chlorhexidine  15 mL Mouth/Throat Once   Or  . mouth rinse  15 mL Mouth Rinse Once  . Chlorhexidine Gluconate Cloth  6 each Topical Daily  . clonazePAM  0.5 mg Oral BID  . insulin aspart  0-5 Units Subcutaneous QHS  . insulin aspart  0-6 Units Subcutaneous TID WC  . insulin glargine  40 Units Subcutaneous BID  . midodrine  10 mg Oral TID with meals  . senna-docusate  2 tablet Oral BID    Dialysis Orders: TTS at [facility] - last 5/3 4:30hr, 250dialyzer, 400/A1.5, EDW 131kg, 2K/2Ca, TDC, no heparin - Finishing course of IV iron, Hgb 11.7 - Calcitriol 1.16mcg PO q HD - On mido, Veltassa, Renvela/Auryxia as binders, PO sensipar 30 qhs.  Assessment/Plan: 1. Malfunctioning TDC catheter: S/p exchange 5/6, per [facility]. 2. Elevated troponin/CAD:Echo with lower EF, possibly for [facility] tomorrow? 3. ESRD:Usual TTS schedule. Got short, ineffective HD 5/6. Ran again on 5/7 with net UF 1.7L. K 5.2 today despite both HD. Does take Veltassa at home daily and hasn't been getting here -> resume. Watch K - may need another HD today. If remains stable, then next will be his usual TTS sched - 5/10. 4. Hypotension/volume:Chronic hypotension (on mido), continue same EDW with HD. Ischemic CM eval as above. 5. Anemia:Hgb > 11, no ESA for now. 6. Metabolic bone disease:Ca/Phos ok. Continue home binders/sensipar. 7. T2DM 8. A-fib:On warfarin - but held for now. Per primary.   Veneta Penton, PA-C 02/16/2021, 9:36 AM  [employer]

## 2021-02-16 NOTE — Evaluation (Signed)
Physical Therapy Evaluation Patient Details Name: [name] [name] MRN: [id number] DOB: 1966-03-23 Today's Date: 02/16/2021   History of Present Illness  55 y.o. male presents to [facility] ED on 02/13/2021 with R chest pain/pressure and SOB. ED work-up revealed elevated troponin, hypotension and tachycardia. Pt had been unable to recevie dialysis on 5/5 due to malfunctioning catheter. Pt underwent dialysis catheter exchange on 5/6. Pt with concern for cadiomyopathy. PMH includes but is not limited to ESRD, DMII, CAD, Afib, peripheral neuropathy, DVT, endocarditis, gout, MI, OSA.  Clinical Impression  Pt demonstrates deficits in strength, endurance, power, and activity tolerance, limiting his ability to independently mobilize. Pt demonstrates ability to perform bed mobility without physical assistance. Patient requires some physical assistance for sit>stand transfers. Pt demonstrates ability to tolerate household distances without physical assistance with a rollator. During gait, pt requires an immediate seated rest break with some instability noted during stand>sit transfer, requiring assistance to correct and control balance. Pt will benefit from training with use of rollator to manage energy conservation, however pt is a safety risk currently using rollator as he demonstrates mild instability with use will continue to train with device to reduce falls risk. Pt will benefit from acute PT to address limitations in activity tolerance and improve WOB. SPT recommends HHPT for improved strength and independent mobility.    Follow Up Recommendations Home health PT    Equipment Recommendations  Rolling walker with 5" wheels    Recommendations for Other Services       Precautions / Restrictions Precautions Precautions: Fall;Other (comment) (hypotensive) Restrictions Weight Bearing Restrictions: No      Mobility  Bed Mobility Overal bed mobility: Modified Independent             General bed mobility  comments: HOB elevated    Transfers Overall transfer level: Needs assistance Equipment used: 4-wheeled walker Transfers: Sit to/from Stand Sit to Stand: Min assist;Mod assist         General transfer comment: Pt requires min A with sit>stand transfers from EOB and recliner, requires mod A to perform sit>stand transfer from rollator seat.  Ambulation/Gait Ambulation/Gait assistance: Min guard Gait Distance (Feet): 75 Feet (x 2 bouts) Assistive device: 4-wheeled walker Gait Pattern/deviations: Step-through pattern;Wide base of support;Trunk flexed Gait velocity: decreased Gait velocity interpretation: >2.62 ft/sec, indicative of community ambulatory General Gait Details: Pt performs 2 bouts of gait, seated rest break in between as patient reports he feels dizzy. BP taken and found to be 90/37. Pt requires 2 L to maintain oxygen sat levels above 92%.  Stairs            Wheelchair Mobility    Modified Rankin (Stroke Patients Only)       Balance Overall balance assessment: Needs assistance Sitting-balance support: Bilateral upper extremity supported;Feet supported Sitting balance-Leahy Scale: Poor Sitting balance - Comments: Pt is unable to don socks, requires assistance   Standing balance support: Bilateral upper extremity supported;Single extremity supported;During functional activity Standing balance-Leahy Scale: Poor Standing balance comment: Pt requires at least single UE support for stability when standing.                             Pertinent Vitals/Pain Pain Assessment: No/denies pain    Home Living Family/patient expects to be discharged to:: Private residence Living Arrangements: Alone Available Help at Discharge: Personal care attendant Type of Home: Apartment Home Access: Ramped entrance     Home Layout: Two level;Bed/bath upstairs Home Equipment:  Electric scooter      Prior Function Level of Independence: Needs assistance   Gait /  Transfers Assistance Needed: pt reports using electric scooter for mobility downstairs and holds on to the walls upstairs.  ADL's / Homemaking Assistance Needed: PCA 3 hours per day, 7 days per week.        Hand Dominance        Extremity/Trunk Assessment   Upper Extremity Assessment Upper Extremity Assessment: Overall WFL for tasks assessed    Lower Extremity Assessment Lower Extremity Assessment: Generalized weakness    Cervical / Trunk Assessment Cervical / Trunk Assessment: Other exceptions Cervical / Trunk Exceptions: excess body habitus  Communication   Communication: No difficulties  Cognition Arousal/Alertness: Awake/alert Behavior During Therapy: WFL for tasks assessed/performed Overall Cognitive Status: Within Functional Limits for tasks assessed                                        General Comments General comments (skin integrity, edema, etc.): Pt has [place] removed upon PT arrival with 2L flowing from [place]. [place] placed back on pt at rest and for mobility, 2 L.    Exercises     Assessment/Plan    PT Assessment Patient needs continued PT services  PT Problem List Decreased strength;Decreased activity tolerance;Decreased mobility;Decreased balance;Decreased knowledge of use of DME;Decreased safety awareness;Decreased knowledge of precautions;Cardiopulmonary status limiting activity       PT Treatment Interventions Gait training;Stair training;Functional mobility training;Therapeutic activities;Therapeutic exercise;DME instruction;Balance training;Patient/family education    PT Goals (Current goals can be found in the Care Plan section)  Acute Rehab PT Goals Patient Stated Goal: Return home and be more independent in his mobility. PT Goal Formulation: With patient Time For Goal Achievement: 03/02/21 Potential to Achieve Goals: Fair    Frequency Min 3X/week   Barriers to discharge        Co-evaluation               AM-PAC PT "6  Clicks" Mobility  Outcome Measure Help needed turning from your back to your side while in a flat bed without using bedrails?: None Help needed moving from lying on your back to sitting on the side of a flat bed without using bedrails?: None Help needed moving to and from a bed to a chair (including a wheelchair)?: A Little Help needed standing up from a chair using your arms (e.g., wheelchair or bedside chair)?: A Little Help needed to walk in hospital room?: A Little Help needed climbing 3-5 steps with a railing? : A Lot 6 Click Score: 19    End of Session Equipment Utilized During Treatment: Gait belt;Oxygen Activity Tolerance: Patient limited by fatigue Patient left: in chair;with call bell/phone within reach;with chair alarm set Nurse Communication: Mobility status PT Visit Diagnosis: Other abnormalities of gait and mobility (R26.89);Muscle weakness (generalized) (M62.81);Difficulty in walking, not elsewhere classified (R26.2)    Time: 1497-0263 PT Time Calculation (min) (ACUTE ONLY): 34 min   Charges:   PT Evaluation $PT Eval Low Complexity: 1 Low PT Treatments $Gait Training: 8-22 mins        Acute Rehab  Pager: [phone]   Garwin Brothers, SPT 02/16/2021, 2:11 PM

## 2021-02-16 NOTE — Progress Notes (Signed)
[facility] for heparin Indication: atrial fibrillation  Allergies  Allergen Reactions  . Shellfish-Derived Products Anaphylaxis  . Ace Inhibitors Cough  . Hydrocodone Other (See Comments)    Hallucinations  . Pork-Derived Products Other (See Comments)    Entry determined to be clinically insignificant: OK TO GIVE HEPARIN (has tolerated well in the past)  Patient had allergic episode in 1989 after he ate pork at a fair. He developed fever and rash and went to the ED, but he did not have breathing issues. Received heparin in the past. Patient ok with removing pork allergy and listing as intolerance instead.    Patient Measurements: Height: 5\' 10"  (177.8 cm) Weight: (!) 137.8 kg (303 lb 12.7 oz) IBW/kg (Calculated) : 73 kg  Vital Signs: Temp: 98.1 F (36.7 C) (05/08 2000) Temp Source: Oral (05/08 2000) BP: 85/61 (05/08 2000) Pulse Rate: 65 (05/08 2000)  Labs: Recent Labs    02/14/21 0328 02/14/21 0551 02/14/21 1356 02/15/21 0927 02/15/21 1634 02/16/21 0608 02/16/21 0731 02/16/21 0734 02/16/21 1400 02/16/21 2231  HGB 11.2*  --  12.6* 11.1*  --  11.1*  --   --   --   --   HCT 38.9*  --  37.0* 38.9*  --  39.0  --   --   --   --   PLT 219  --   --  207  --  213  --   --   --   --   LABPROT  --  18.4*  --  18.3*  --  19.8*  --   --   --   --   INR  --  1.5*  --  1.5*  --  1.7*  --   --   --   --   HEPARINUNFRC  --   --   --   --   --  <0.10*  --   --  <0.10* 0.17*  CREATININE 7.81*  --  10.10* 6.51*  --   --   --  5.46*  --   --   TROPONINIHS 922* 1,335*  --   --  950*  --  656*  --   --   --     Estimated Creatinine Clearance: 20.9 mL/min (A) (by C-G formula based on SCr of 5.46 mg/dL (H)).  Assessment: 55 y.o. male with h/o Afib, Coumadin on hold, for heparin Goal of Therapy:  INR 2-3 Monitor platelets by anticoagulation protocol: Yes   Plan:  Increase Heparin 2250 units/hr Follow-up am labs.   Phillis Knack, PharmD, BCPS   02/16/2021

## 2021-02-16 NOTE — Progress Notes (Addendum)
PROGRESS NOTE    [name] [name]  DEY:[id number] DOB: 01-30-1966 DOA: 02/13/2021 PCP: Hoyt Koch, MD    Chief Complaint  Patient presents with  . Chest Pain    Vascular Access Malfunction     Brief Narrative:  55 year old gentleman with prior history of end-stage renal disease on dialysis on Tuesdays Thursdays and Saturdays, multiple upper extremity AV access procedures, recent tunneled HD catheter placed recently appears to be malfunctioning and he could not complete his dialysis on 02/13/2021.  He was brought into ED for possible exchange of the catheter.  While in the ED patient reports he had atypical chest pain. Chest pain has since resolved. He underwent HD catheter exchange on 02/14/21.  Pt seen and examined today, he reports chest pain when he walked to the bathroom. As per the patient, he has been feeling very weak and sob since October 2021.    Assessment & Plan:   Active Problems:   HLD (hyperlipidemia)   OSA (obstructive sleep apnea)   Severe obesity (BMI >= 40) (HCC)   Diabetes mellitus, type 2 (HCC)   Diabetic peripheral neuropathy (HCC)   Anemia due to chronic kidney disease   HFrEF (heart failure with reduced ejection fraction) (HCC)   ESRD on dialysis (HCC)   Hyperkalemia   Iron deficiency anemia, unspecified   Atrial Fibrillation/Flutter   Hypotension   Chest pain   Chest pain:  Intermittent, one episode today during ambulation.   Minimally elevated troponins.  Cardiology on board., and in view of his decrease in the LVEF to 25% from 45 % last October 2021, recommended LHC in am. He was started on IV heparin and continue with aspirin.   Pt agreeable.     ESRD ON HD TTS scheduled.  Follows up with Dr Carolin Sicks as outpatient.  Hd as per Nephrology.   Hyperkalemia  On valtessa. Recheck BMP in am.    H/o CAD s/p PCI IN 10/21 Currently chest pain free.  Continue with aspirin and lipitor.    Diabetes mellitus:  uncontrolled.  Insulin  dependent with ESRD.  CBG (last 3)  Recent Labs    02/15/21 2131 02/16/21 0748 02/16/21 1113  GLUCAP 225* 129* 179*   Resume home meds.  Last A1c is 7.2% currently taking 40 units of lantus BID.    HD catheter malfunction:  Replaced by vascular surgery this admission.     Hypotension:  Chronic and is on midodrine.     Chronic systolic heart failure:  Fluid management with HD     Atrial flutter  Rate controlled and on coumadin for anti coagulation, which has been held for [facility] in am and transitioned to IV heparin.  Sub therapeutic INR.    Anemia of chronic disease secondary to ESRD:  Baseline hemoglobin around 11 and stable.    OSA;  On CPAP at home.   Body mass index is 43.59 kg/m. Morbid obesity.  Outpatient follow up with PCp.    DVT prophylaxis: heparin.  Code Status: full code.  Family Communication: none at bedside Disposition:   Status is: Inpatient  Remains inpatient appropriate because:Ongoing diagnostic testing needed not appropriate for outpatient work up and IV treatments appropriate due to intensity of illness or inability to take PO   Dispo:  Patient From: Home  Planned Disposition: Home  Medically stable for discharge: No         Consultants:   Vascular surgery.   Nephrology  Cardiology.   Procedures:HD catheter exchange.    Antimicrobials:  none.    Subjective: Chest pain when he ambulated earlier this am, resolved at rest.  No nausea or vomiting.  Pt worried about his venogram with Dr Jimmye Norman at [facility] scheduled on Tuesday.   Objective: Vitals:   02/16/21 0713 02/16/21 0733 02/16/21 0745 02/16/21 1125  BP: (!) 84/59 (!) 95/49 (!) 95/49 100/89  Pulse: (!) 102 (!) 101 100   Resp:   20   Temp: 98.1 F (36.7 C)  98.1 F (36.7 C)   TempSrc: Oral  Oral   SpO2: 91% 96% 97%   Weight:      Height:        Intake/Output Summary (Last 24 hours) at 02/16/2021 1146 Last data filed at 02/16/2021 0421 Gross per  24 hour  Intake 582.08 ml  Output 1700 ml  Net -1117.92 ml   Filed Weights   02/13/21 2225 02/14/21 0437 02/15/21 0426  Weight: 130 kg 134.9 kg (!) 137.8 kg    Examination:  General exam: Well-developed gentleman, not in any kind of distress on room air. Respiratory system: Air entry fair, no wheezing or rhonchi Cardiovascular system: S1-S2 heard, regular rate rhythm, no JVD Gastrointestinal system: Abdomen is soft nontender bowel sounds normal Central nervous system: Alert and oriented Extremities: No cyanosis or clubbing Skin: No rashes seen Psychiatry: .  Mood is appropriate e.     Data Reviewed: I have personally reviewed following labs and imaging studies  CBC: Recent Labs  Lab 02/13/21 2240 02/14/21 0328 02/14/21 1356 02/15/21 0927 02/16/21 0608  WBC 8.2 7.4  --  6.7 7.1  HGB 11.4* 11.2* 12.6* 11.1* 11.1*  HCT 39.9 38.9* 37.0* 38.9* 39.0  MCV 102.0* 101.8*  --  103.2* 104.0*  PLT 223 219  --  207 130    Basic Metabolic Panel: Recent Labs  Lab 02/13/21 2240 02/14/21 0328 02/14/21 1356 02/15/21 0927 02/16/21 0734  NA 137  --  138 136 134*  K 4.7  --  5.1 5.6* 5.2*  CL 97*  --  100 99 100  CO2 32  --   --  28 22  GLUCOSE 298*  --  104* 157* 142*  BUN 36*  --  40* 26* 25*  CREATININE 7.70* 7.81* 10.10* 6.51* 5.46*  CALCIUM 8.6*  --   --  8.5* 8.0*  MG  --   --   --  2.2  --   PHOS  --   --   --  5.2*  --     GFR: Estimated Creatinine Clearance: 20.9 mL/min (A) (by C-G formula based on SCr of 5.46 mg/dL (H)).  Liver Function Tests: No results for input(s): AST, ALT, ALKPHOS, BILITOT, PROT, ALBUMIN in the last 168 hours.  CBG: Recent Labs  Lab 02/15/21 1118 02/15/21 1846 02/15/21 2131 02/16/21 0748 02/16/21 1113  GLUCAP 178* 199* 225* 129* 179*     Recent Results (from the past 240 hour(s))  Resp Panel by RT-PCR (Flu A&B, Covid) Nasopharyngeal Swab     Status: None   Collection Time: 02/13/21 10:38 PM   Specimen: Nasopharyngeal Swab;  Nasopharyngeal(NP) swabs in vial transport medium  Result Value Ref Range Status   SARS Coronavirus 2 by RT PCR NEGATIVE NEGATIVE Final    Comment: (NOTE) SARS-CoV-2 target nucleic acids are NOT DETECTED.  The SARS-CoV-2 RNA is generally detectable in upper respiratory specimens during the acute phase of infection. The lowest concentration of SARS-CoV-2 viral copies this assay can detect is 138 copies/mL. A negative result does not  preclude SARS-Cov-2 infection and should not be used as the sole basis for treatment or other patient management decisions. A negative result may occur with  improper specimen collection/handling, submission of specimen other than nasopharyngeal swab, presence of viral mutation(s) within the areas targeted by this assay, and inadequate number of viral copies(<138 copies/mL). A negative result must be combined with clinical observations, patient history, and epidemiological information. The expected result is Negative.  Fact Sheet for Patients:  EntrepreneurPulse.com.au  Fact Sheet for Healthcare Providers:  IncredibleEmployment.be  This test is no t yet approved or cleared by the [place] FDA and  has been authorized for detection and/or diagnosis of SARS-CoV-2 by FDA under an Emergency Use Authorization (EUA). This EUA will remain  in effect (meaning this test can be used) for the duration of the COVID-19 declaration under Section 564(b)(1) of the Act, 21 U.S.C.section 360bbb-3(b)(1), unless the authorization is terminated  or revoked sooner.       Influenza A by PCR NEGATIVE NEGATIVE Final   Influenza B by PCR NEGATIVE NEGATIVE Final    Comment: (NOTE) The Xpert Xpress SARS-CoV-2/FLU/RSV plus assay is intended as an aid in the diagnosis of influenza from Nasopharyngeal swab specimens and should not be used as a sole basis for treatment. Nasal washings and aspirates are unacceptable for Xpert Xpress  SARS-CoV-2/FLU/RSV testing.  Fact Sheet for Patients: EntrepreneurPulse.com.au  Fact Sheet for Healthcare Providers: IncredibleEmployment.be  This test is not yet approved or cleared by the [place] FDA and has been authorized for detection and/or diagnosis of SARS-CoV-2 by FDA under an Emergency Use Authorization (EUA). This EUA will remain in effect (meaning this test can be used) for the duration of the COVID-19 declaration under Section 564(b)(1) of the Act, 21 U.S.C. section 360bbb-3(b)(1), unless the authorization is terminated or revoked.  Performed at [facility], [place] [address], [place], [place] [address]          Radiology Studies: DG CHEST PORT 1 VIEW  Result Date: 02/16/2021 CLINICAL DATA:  Short of breath, chest pains EXAM: PORTABLE CHEST 1 VIEW COMPARISON:  Prior chest x-ray 02/15/2021 FINDINGS: Well-positioned right IJ tunneled hemodialysis catheter. Catheter tip overlies the mid right atrium. Stable cardiomegaly and mediastinal contours. Atherosclerotic calcifications again noted in the transverse aorta. Similar appearance of pulmonary vascular congestion bordering on mild edema. No evidence of pneumothorax. No large pleural effusion. No acute osseous abnormality. Metallic stents present in the right upper extremity axillary vein. IMPRESSION: 1. Cardiomegaly and pulmonary vascular congestion bordering on mild edema appears similar compared to 02/14/2021. 2. Stable and except will position of right IJ tunneled hemodialysis catheter. 3. No evidence of pneumothorax. Electronically Signed   By: Jacqulynn Cadet M.D.   On: 02/16/2021 10:04   DG Chest Port 1 View  Result Date: 02/14/2021 CLINICAL DATA:  Catheter placement EXAM: PORTABLE CHEST 1 VIEW COMPARISON:  02/14/2021 FINDINGS: Right-sided central venous catheter tip over the mid right atrium. Incompletely visualized left lung base. Cardiomegaly with vascular congestion and  mild edema. Aortic atherosclerosis. No pneumothorax. Upper normal mediastinal silhouette, likely due to portable technique and slight degree of patient rotation. IMPRESSION: 1. Right-sided central venous catheter tip over the mid right atrium. No pneumothorax. 2. Cardiomegaly with vascular congestion, mild pulmonary edema and probable pleural effusions. Electronically Signed   By: Donavan Foil M.D.   On: 02/14/2021 18:18   ECHOCARDIOGRAM COMPLETE  Result Date: 02/15/2021    ECHOCARDIOGRAM REPORT   Patient Name:   [name] [name] Date of  Exam: 02/15/2021 Medical Rec #:  [id number]      Height:       70.0 in Accession #:    [id number]     Weight:       303.8 lb Date of Birth:  [birth year]      BSA:          2.493 m Patient Age:    24 years       BP:           78/40 mmHg Patient Gender: M              HR:           90 bpm. Exam Location:  Inpatient Procedure: 2D Echo, Cardiac Doppler, Color Doppler and Intracardiac            Opacification Agent Indications:    Elevated troponin  History:        Patient has prior history of Echocardiogram examinations, most                 recent 07/29/2020. CHF, Previous Myocardial Infarction, COPD,                 Arrythmias:Atrial Fibrillation, Signs/Symptoms:Murmur; Risk                 Factors:Hypertension, Diabetes and Dyslipidemia.  Sonographer:    Luisa Hart RDCS Referring Phys: [id number] Hulen Shouts RHYNE  Sonographer Comments: 07/27/2020 cath IMPRESSIONS  1. Left ventricular ejection fraction, by estimation, is 25 to 30%. The left ventricle has severely decreased function. The left ventricle demonstrates regional wall motion abnormalities. There is global hypokinesis with disproportionately severe hypokinesis of the left ventricular, mid-apical anteroseptal wall, anterior wall, apical segment and inferior wall. There is moderate concentric left ventricular hypertrophy. Left ventricular diastolic function could not be evaluated.  2. Right ventricular systolic function is moderately  reduced. The right ventricular size is moderately enlarged. There is moderately elevated pulmonary artery systolic pressure.  3. Left atrial size was severely dilated.  4. A small pericardial effusion is present. The pericardial effusion is circumferential. There is no evidence of cardiac tamponade.  5. The mitral valve is degenerative. Mild mitral valve regurgitation. Mild mitral stenosis. The mean mitral valve gradient is 4.8 mmHg. Severe mitral annular calcification.  6. Tricuspid valve regurgitation is moderate.  7. The aortic valve is tricuspid. There is mild calcification of the aortic valve. There is mild thickening of the aortic valve. Aortic valve regurgitation is mild. Mild aortic valve sclerosis is present, with no evidence of aortic valve stenosis.  8. The inferior vena cava is dilated in size with <50% respiratory variability, suggesting right atrial pressure of 15 mmHg. Comparison(s): The left ventricular function is significantly worse. The left ventricular wall motion abnormalities are significantly worse. There appears to be interval ischemia or infarction in the LAD artery distribution. FINDINGS  Left Ventricle: Left ventricular ejection fraction, by estimation, is 25 to 30%. The left ventricle has severely decreased function. The left ventricle demonstrates regional wall motion abnormalities. There is global hypokinesis with disproportionately severe hypokinesis of the left ventricular, mid-apical anteroseptal wall, anterior wall, apical segment and inferior wall. Definity contrast agent was given IV to delineate the left ventricular endocardial borders. The left ventricular internal cavity size was normal in size. There is moderate concentric left ventricular hypertrophy. Abnormal (paradoxical) septal motion, consistent with left bundle branch block. Left ventricular diastolic function could not be evaluated due to atrial fibrillation. Left ventricular diastolic function could not  be evaluated.  Right Ventricle: The right ventricular size is moderately enlarged. No increase in right ventricular wall thickness. Right ventricular systolic function is moderately reduced. There is moderately elevated pulmonary artery systolic pressure. The tricuspid  regurgitant velocity is 3.17 m/s, and with an assumed right atrial pressure of 15 mmHg, the estimated right ventricular systolic pressure is 35.0 mmHg. Left Atrium: Left atrial size was severely dilated. Right Atrium: Right atrial size was normal in size. Pericardium: A small pericardial effusion is present. The pericardial effusion is circumferential. There is no evidence of cardiac tamponade. Mitral Valve: The mitral valve is degenerative in appearance. Severe mitral annular calcification. Mild mitral valve regurgitation. Mild mitral valve stenosis. The mean mitral valve gradient is 4.8 mmHg with average heart rate of 85 bpm. Tricuspid Valve: The tricuspid valve is normal in structure. Tricuspid valve regurgitation is moderate. Aortic Valve: The aortic valve is tricuspid. There is mild calcification of the aortic valve. There is mild thickening of the aortic valve. Aortic valve regurgitation is mild. Aortic regurgitation PHT measures 336 msec. Mild aortic valve sclerosis is present, with no evidence of aortic valve stenosis. Aortic valve mean gradient measures 5.0 mmHg. Aortic valve peak gradient measures 10.1 mmHg. Aortic valve area, by VTI measures 2.10 cm. Pulmonic Valve: The pulmonic valve was not well visualized. Pulmonic valve regurgitation is not visualized. Aorta: The aortic root and ascending aorta are structurally normal, with no evidence of dilitation. Venous: The inferior vena cava is dilated in size with less than 50% respiratory variability, suggesting right atrial pressure of 15 mmHg. IAS/Shunts: No atrial level shunt detected by color flow Doppler.  LEFT VENTRICLE PLAX 2D LVIDd:         4.90 cm      Diastology LVIDs:         3.20 cm      LV e'  medial:  3.60 cm/s LV PW:         1.50 cm      LV e' lateral: 5.80 cm/s LV IVS:        1.40 cm LVOT diam:     2.40 cm LV SV:         59 LV SV Index:   24 LVOT Area:     4.52 cm  LV Volumes (MOD) LV vol d, MOD A2C: 146.0 ml LV vol d, MOD A4C: 154.0 ml LV vol s, MOD A2C: 116.0 ml LV vol s, MOD A4C: 96.4 ml LV SV MOD A2C:     30.0 ml LV SV MOD A4C:     154.0 ml LV SV MOD BP:      45.4 ml RIGHT VENTRICLE RV S prime:     7.03 cm/s TAPSE (M-mode): 1.4 cm LEFT ATRIUM             Index LA diam:        6.10 cm 2.45 cm/m LA Vol (A2C):   86.4 ml 34.66 ml/m LA Vol (A4C):   77.5 ml 31.09 ml/m LA Biplane Vol: 83.4 ml 33.45 ml/m  AORTIC VALVE                    PULMONIC VALVE AV Area (Vmax):    2.23 cm     PV Vmax:       0.88 m/s AV Area (Vmean):   2.07 cm     PV Vmean:      72.400 cm/s AV Area (VTI):     2.10 cm     PV  VTI:        0.160 m AV Vmax:           159.00 cm/s  PV Peak grad:  3.1 mmHg AV Vmean:          105.500 cm/s PV Mean grad:  2.0 mmHg AV VTI:            0.280 m AV Peak Grad:      10.1 mmHg AV Mean Grad:      5.0 mmHg LVOT Vmax:         78.30 cm/s LVOT Vmean:        48.200 cm/s LVOT VTI:          0.130 m LVOT/AV VTI ratio: 0.46 AI PHT:            336 msec  AORTA Ao Root diam: 3.50 cm Ao Asc diam:  3.40 cm MITRAL VALVE              TRICUSPID VALVE MV Area (PHT): 3.76 cm   TR Peak grad:   40.2 mmHg MV Mean grad:  4.8 mmHg   TR Vmax:        317.00 cm/s MV Decel Time: 202 msec MR Peak grad: 45.2 mmHg   SHUNTS MR Mean grad: 30.0 mmHg   Systemic VTI:  0.13 m MR Vmax:      336.00 cm/s Systemic Diam: 2.40 cm MR Vmean:     259.0 cm/s Dani Gobble Croitoru MD Electronically signed by Sanda Klein MD Signature Date/Time: 02/15/2021/1:32:24 PM    Final    HYBRID OR IMAGING ([place])  Result Date: 02/14/2021 There is no interpretation for this exam.  This order is for images obtained during a surgical procedure.  Please See "Surgeries" Tab for more information regarding the procedure.        Scheduled Meds: . aspirin  EC  81 mg Oral Daily  . atorvastatin  80 mg Oral Daily  . chlorhexidine  15 mL Mouth/Throat Once   Or  . mouth rinse  15 mL Mouth Rinse Once  . Chlorhexidine Gluconate Cloth  6 each Topical Daily  . cinacalcet  30 mg Oral Q supper  . clonazePAM  0.5 mg Oral BID  . ferric citrate  420 mg Oral TID WC  . insulin aspart  0-5 Units Subcutaneous QHS  . insulin aspart  0-6 Units Subcutaneous TID WC  . insulin glargine  40 Units Subcutaneous BID  . midodrine  10 mg Oral TID with meals  . patiromer  8.4 g Oral Daily  . senna-docusate  2 tablet Oral BID  . sevelamer carbonate  1,600 mg Oral TID WC   Continuous Infusions: . heparin 1,650 Units/hr (02/16/21 0717)     LOS: 2 days       Hosie Poisson, MD [facility]   To contact the attending provider between 7A-7P or the covering provider during after hours 7P-7A, please log into the web site [email or web address] and access using universal [place] password for that web site. If you do not have the password, please call the hospital operator.  02/16/2021, 11:46 AM

## 2021-02-16 NOTE — Progress Notes (Signed)
IVT consulted for difficult stick.  Pt sitting in chair and states going to restroom; just called out for assistance.  Advised will come back for assessment.

## 2021-02-16 NOTE — Progress Notes (Signed)
[place] for heparin Indication: atrial fibrillation  Allergies  Allergen Reactions  . Shellfish-Derived Products Anaphylaxis  . Ace Inhibitors Cough  . Hydrocodone Other (See Comments)    Hallucinations  . Pork-Derived Products Other (See Comments)    Entry determined to be clinically insignificant: OK TO GIVE HEPARIN (has tolerated well in the past)  Patient had allergic episode in 1989 after he ate pork at a fair. He developed fever and rash and went to the ED, but he did not have breathing issues. Received heparin in the past. Patient ok with removing pork allergy and listing as intolerance instead.    Patient Measurements: Height: 5\' 10"  (177.8 cm) Weight: (!) 137.8 kg (303 lb 12.7 oz) IBW/kg (Calculated) : 73 kg  Vital Signs: Temp: 98.1 F (36.7 C) (05/08 0102) Temp Source: Oral (05/08 0102) BP: 103/50 (05/08 0102) Pulse Rate: 92 (05/08 0102)  Labs: Recent Labs    02/13/21 2240 02/14/21 0024 02/14/21 0328 02/14/21 0551 02/14/21 1356 02/15/21 0927 02/15/21 1634 02/16/21 0608  HGB 11.4*  --  11.2*  --  12.6* 11.1*  --  11.1*  HCT 39.9  --  38.9*  --  37.0* 38.9*  --  39.0  PLT 223  --  219  --   --  207  --  213  LABPROT 18.7*  --   --  18.4*  --  18.3*  --   --   INR 1.6*  --   --  1.5*  --  1.5*  --   --   HEPARINUNFRC  --   --   --   --   --   --   --  <0.10*  CREATININE 7.70*  --  7.81*  --  10.10* 6.51*  --   --   TROPONINIHS 572*   < > 922* 1,335*  --   --  950*  --    < > = values in this interval not displayed.    Estimated Creatinine Clearance: 17.5 mL/min (A) (by C-G formula based on SCr of 6.51 mg/dL (H)).  Assessment: 55 y.o. male with h/o Afib, Coumadin on hold, for heparin Goal of Therapy:  INR 2-3 Monitor platelets by anticoagulation protocol: Yes   Plan:  Increase Heparin 1650 units/hr Check heparin level in 8 hours.  Phillis Knack, PharmD, BCPS  02/16/2021

## 2021-02-17 ENCOUNTER — Encounter (HOSPITAL_COMMUNITY)
Admission: EM | Disposition: A | Discharge status: Home / Self Care | Payer: Self-pay | Source: Home / Self Care | Attending: Internal Medicine

## 2021-02-17 ENCOUNTER — Ambulatory Visit: Payer: Medicare HMO | Admitting: Podiatry

## 2021-02-17 DIAGNOSIS — R079 Chest pain, unspecified: Secondary | ICD-10-CM | POA: Diagnosis not present

## 2021-02-17 DIAGNOSIS — N186 End stage renal disease: Secondary | ICD-10-CM

## 2021-02-17 DIAGNOSIS — I5020 Unspecified systolic (congestive) heart failure: Secondary | ICD-10-CM

## 2021-02-17 DIAGNOSIS — I2510 Atherosclerotic heart disease of native coronary artery without angina pectoris: Secondary | ICD-10-CM

## 2021-02-17 DIAGNOSIS — I214 Non-ST elevation (NSTEMI) myocardial infarction: Secondary | ICD-10-CM

## 2021-02-17 DIAGNOSIS — Z794 Long term (current) use of insulin: Secondary | ICD-10-CM

## 2021-02-17 DIAGNOSIS — E1122 Type 2 diabetes mellitus with diabetic chronic kidney disease: Secondary | ICD-10-CM | POA: Diagnosis not present

## 2021-02-17 HISTORY — PX: CORONARY STENT INTERVENTION: CATH118234

## 2021-02-17 HISTORY — PX: LEFT HEART CATH AND CORONARY ANGIOGRAPHY: CATH118249

## 2021-02-17 LAB — CBC
HCT: 37.3 % — ABNORMAL LOW (ref 39.0–52.0)
Hemoglobin: 10.8 g/dL — ABNORMAL LOW (ref 13.0–17.0)
MCH: 29.5 pg (ref 26.0–34.0)
MCHC: 29 g/dL — ABNORMAL LOW (ref 30.0–36.0)
MCV: 101.9 fL — ABNORMAL HIGH (ref 80.0–100.0)
Platelets: 236 10*3/uL (ref 150–400)
RBC: 3.66 MIL/uL — ABNORMAL LOW (ref 4.22–5.81)
RDW: 21.2 % — ABNORMAL HIGH (ref 11.5–15.5)
WBC: 7.4 10*3/uL (ref 4.0–10.5)
nRBC: 0.5 % — ABNORMAL HIGH (ref 0.0–0.2)

## 2021-02-17 LAB — GLUCOSE, CAPILLARY
Glucose-Capillary: 102 mg/dL — ABNORMAL HIGH (ref 70–99)
Glucose-Capillary: 107 mg/dL — ABNORMAL HIGH (ref 70–99)
Glucose-Capillary: 124 mg/dL — ABNORMAL HIGH (ref 70–99)
Glucose-Capillary: 130 mg/dL — ABNORMAL HIGH (ref 70–99)
Glucose-Capillary: 134 mg/dL — ABNORMAL HIGH (ref 70–99)
Glucose-Capillary: 45 mg/dL — ABNORMAL LOW (ref 70–99)
Glucose-Capillary: 56 mg/dL — ABNORMAL LOW (ref 70–99)
Glucose-Capillary: 57 mg/dL — ABNORMAL LOW (ref 70–99)

## 2021-02-17 LAB — BASIC METABOLIC PANEL
Anion gap: 12 (ref 5–15)
BUN: 38 mg/dL — ABNORMAL HIGH (ref 6–20)
CO2: 24 mmol/L (ref 22–32)
Calcium: 8.5 mg/dL — ABNORMAL LOW (ref 8.9–10.3)
Chloride: 102 mmol/L (ref 98–111)
Creatinine, Ser: 7.52 mg/dL — ABNORMAL HIGH (ref 0.61–1.24)
GFR, Estimated: 8 mL/min — ABNORMAL LOW (ref >60)
Glucose, Bld: 51 mg/dL — ABNORMAL LOW (ref 70–99)
Potassium: 4.3 mmol/L (ref 3.5–5.1)
Sodium: 138 mmol/L (ref 135–145)

## 2021-02-17 LAB — POCT ACTIVATED CLOTTING TIME
Activated Clotting Time: 0 seconds
Activated Clotting Time: 166 seconds
Activated Clotting Time: 184 seconds
Activated Clotting Time: 214 seconds
Activated Clotting Time: 237 seconds
Activated Clotting Time: 267 seconds
Activated Clotting Time: 499 seconds

## 2021-02-17 LAB — PROTIME-INR
INR: 1.5 — ABNORMAL HIGH (ref 0.8–1.2)
Prothrombin Time: 17.8 seconds — ABNORMAL HIGH (ref 11.4–15.2)

## 2021-02-17 LAB — HEPARIN LEVEL (UNFRACTIONATED): Heparin Unfractionated: 0.35 IU/mL (ref 0.30–0.70)

## 2021-02-17 SURGERY — LEFT HEART CATH AND CORONARY ANGIOGRAPHY
Anesthesia: LOCAL

## 2021-02-17 MED ORDER — ACETAMINOPHEN 325 MG PO TABS: 650.0000 mg | ORAL_TABLET | ORAL | Status: DC | PRN

## 2021-02-17 MED ORDER — DEXTROSE 50 % IV SOLN
1.0000 | Freq: Once | INTRAVENOUS | Status: AC
  Administered 2021-02-17: 50 mL via INTRAVENOUS

## 2021-02-17 MED ORDER — DEXTROSE 50 % IV SOLN
12.5000 g | INTRAVENOUS | Status: AC
  Administered 2021-02-17: 12.5 g via INTRAVENOUS

## 2021-02-17 MED ORDER — HYDROCORTISONE (PERIANAL) 2.5 % EX CREA
TOPICAL_CREAM | Freq: Four times a day (QID) | CUTANEOUS | Status: DC
  Filled 2021-02-17: qty 28.35

## 2021-02-17 MED ORDER — DEXTROSE 50 % IV SOLN
INTRAVENOUS | Status: AC
  Filled 2021-02-17: qty 50

## 2021-02-17 MED ORDER — LIDOCAINE HCL (PF) 1 % IJ SOLN
INTRAMUSCULAR | Status: DC | PRN
  Administered 2021-02-17: 7 mL

## 2021-02-17 MED ORDER — WARFARIN SODIUM 5 MG PO TABS
5.0000 mg | ORAL_TABLET | Freq: Once | ORAL | Status: AC
  Administered 2021-02-18: 5 mg via ORAL
  Filled 2021-02-17: qty 1

## 2021-02-17 MED ORDER — LABETALOL HCL 5 MG/ML IV SOLN: 10.0000 mg | INTRAVENOUS | Status: AC | PRN

## 2021-02-17 MED ORDER — LIDOCAINE HCL (PF) 1 % IJ SOLN
INTRAMUSCULAR | Status: AC
  Filled 2021-02-17: qty 30

## 2021-02-17 MED ORDER — INSULIN GLARGINE 100 UNIT/ML ~~LOC~~ SOLN
30.0000 [IU] | Freq: Two times a day (BID) | SUBCUTANEOUS | Status: DC
  Filled 2021-02-17: qty 0.3

## 2021-02-17 MED ORDER — SODIUM CHLORIDE 0.9 % IV SOLN
INTRAVENOUS | Status: DC | PRN
  Administered 2021-02-17: 1 mg/kg/h via INTRAVENOUS

## 2021-02-17 MED ORDER — SODIUM CHLORIDE 0.9% FLUSH
3.0000 mL | Freq: Two times a day (BID) | INTRAVENOUS | Status: DC
  Administered 2021-02-18: 3 mL via INTRAVENOUS

## 2021-02-17 MED ORDER — FENTANYL CITRATE (PF) 100 MCG/2ML IJ SOLN
25.0000 ug | Freq: Once | INTRAMUSCULAR | Status: AC
  Administered 2021-02-17: 25 ug via INTRAVENOUS

## 2021-02-17 MED ORDER — IOHEXOL 350 MG/ML SOLN
INTRAVENOUS | Status: DC | PRN
  Administered 2021-02-17: 130 mL

## 2021-02-17 MED ORDER — WARFARIN - PHARMACIST DOSING INPATIENT: Freq: Every day | Status: DC

## 2021-02-17 MED ORDER — FENTANYL CITRATE (PF) 100 MCG/2ML IJ SOLN
INTRAMUSCULAR | Status: AC
  Filled 2021-02-17: qty 2

## 2021-02-17 MED ORDER — ATROPINE SULFATE 1 MG/10ML IJ SOSY
PREFILLED_SYRINGE | INTRAMUSCULAR | Status: AC
  Filled 2021-02-17: qty 10

## 2021-02-17 MED ORDER — WITCH HAZEL-GLYCERIN EX PADS
MEDICATED_PAD | CUTANEOUS | Status: DC | PRN
  Filled 2021-02-17: qty 100

## 2021-02-17 MED ORDER — TICAGRELOR 90 MG PO TABS
90.0000 mg | ORAL_TABLET | Freq: Two times a day (BID) | ORAL | Status: DC
  Administered 2021-02-18 (×2): 90 mg via ORAL
  Filled 2021-02-17 (×2): qty 1

## 2021-02-17 MED ORDER — SODIUM CHLORIDE 0.9 % IV SOLN: 250.0000 mL | INTRAVENOUS | Status: DC | PRN

## 2021-02-17 MED ORDER — MIDAZOLAM HCL 2 MG/2ML IJ SOLN
INTRAMUSCULAR | Status: AC
  Filled 2021-02-17: qty 2

## 2021-02-17 MED ORDER — HEPARIN (PORCINE) IN NACL 1000-0.9 UT/500ML-% IV SOLN
INTRAVENOUS | Status: AC
  Filled 2021-02-17: qty 1000

## 2021-02-17 MED ORDER — TICAGRELOR 90 MG PO TABS
ORAL_TABLET | ORAL | Status: DC | PRN
  Administered 2021-02-17: 180 mg via ORAL

## 2021-02-17 MED ORDER — BIVALIRUDIN TRIFLUOROACETATE 250 MG IV SOLR
INTRAVENOUS | Status: AC
  Filled 2021-02-17: qty 250

## 2021-02-17 MED ORDER — FENTANYL CITRATE (PF) 100 MCG/2ML IJ SOLN
INTRAMUSCULAR | Status: DC | PRN
  Administered 2021-02-17: 25 ug via INTRAVENOUS

## 2021-02-17 MED ORDER — METHYLPREDNISOLONE SODIUM SUCC 125 MG IJ SOLR
INTRAMUSCULAR | Status: DC | PRN
  Administered 2021-02-17: 125 mg via INTRAVENOUS

## 2021-02-17 MED ORDER — HYDRALAZINE HCL 20 MG/ML IJ SOLN: 10.0000 mg | INTRAMUSCULAR | Status: AC | PRN

## 2021-02-17 MED ORDER — MIDAZOLAM HCL 2 MG/2ML IJ SOLN
INTRAMUSCULAR | Status: DC | PRN
  Administered 2021-02-17: 1 mg via INTRAVENOUS

## 2021-02-17 MED ORDER — SODIUM CHLORIDE 0.9 % IV SOLN
INTRAVENOUS | Status: AC | PRN
  Administered 2021-02-17: 250 mL via INTRAVENOUS

## 2021-02-17 MED ORDER — SODIUM CHLORIDE 0.9% FLUSH: 3.0000 mL | INTRAVENOUS | Status: DC | PRN

## 2021-02-17 MED ORDER — METHYLPREDNISOLONE SODIUM SUCC 125 MG IJ SOLR
INTRAMUSCULAR | Status: AC
  Filled 2021-02-17: qty 2

## 2021-02-17 MED ORDER — HEPARIN (PORCINE) IN NACL 1000-0.9 UT/500ML-% IV SOLN
INTRAVENOUS | Status: DC | PRN
  Administered 2021-02-17 (×2): 500 mL

## 2021-02-17 MED ORDER — BIVALIRUDIN BOLUS VIA INFUSION - CUPID
INTRAVENOUS | Status: DC | PRN
  Administered 2021-02-17: 102.3 mg via INTRAVENOUS

## 2021-02-17 MED ORDER — TICAGRELOR 90 MG PO TABS
ORAL_TABLET | ORAL | Status: AC
  Filled 2021-02-17: qty 2

## 2021-02-17 MED ORDER — INSULIN GLARGINE 100 UNIT/ML ~~LOC~~ SOLN
20.0000 [IU] | Freq: Two times a day (BID) | SUBCUTANEOUS | Status: DC
  Administered 2021-02-18: 20 [IU] via SUBCUTANEOUS
  Filled 2021-02-17 (×3): qty 0.2

## 2021-02-17 SURGICAL SUPPLY — 17 items
BALLN SAPPHIRE 2.5X15 (BALLOONS) ×2
BALLN SAPPHIRE ~~LOC~~ 3.75X15 (BALLOONS) ×2 IMPLANT
BALLOON SAPPHIRE 2.5X15 (BALLOONS) ×1 IMPLANT
CATH INFINITI 5FR MULTPACK ANG (CATHETERS) ×2 IMPLANT
CATH VISTA GUIDE 6FR XBLAD3.5 (CATHETERS) ×2 IMPLANT
KIT ENCORE 26 ADVANTAGE (KITS) ×2 IMPLANT
KIT HEART LEFT (KITS) ×2 IMPLANT
KIT MICROPUNCTURE NIT STIFF (SHEATH) ×2 IMPLANT
PACK CARDIAC CATHETERIZATION (CUSTOM PROCEDURE TRAY) ×2 IMPLANT
SHEATH PINNACLE 5F 10CM (SHEATH) ×2 IMPLANT
SHEATH PINNACLE 6F 10CM (SHEATH) ×2 IMPLANT
SHEATH PROBE COVER 6X72 (BAG) ×2 IMPLANT
STENT RESOLUTE ONYX 3.5X18 (Permanent Stent) ×2 IMPLANT
TRANSDUCER W/STOPCOCK (MISCELLANEOUS) ×2 IMPLANT
TUBING CIL FLEX 10 FLL-RA (TUBING) ×2 IMPLANT
WIRE COUGAR XT STRL 190CM (WIRE) ×2 IMPLANT
WIRE EMERALD 3MM-J .035X150CM (WIRE) ×2 IMPLANT

## 2021-02-17 NOTE — Progress Notes (Signed)
Repeat CBG 45. Dr. Angelena Form made aware. D50 1 amp IV given.

## 2021-02-17 NOTE — Progress Notes (Signed)
Inpatient Diabetes Program Recommendations  AACE/ADA: New Consensus Statement on Inpatient Glycemic Control (2015)  Target Ranges:  Prepandial:   less than 140 mg/dL      Peak postprandial:   less than 180 mg/dL (1-2 hours)      Critically ill patients:  140 - 180 mg/dL   Lab Results  Component Value Date   GLUCAP 45 (L) 02/17/2021   HGBA1C 7.2 (H) 01/14/2021    Review of Glycemic Control Results for LATIF, NAZARENO (MRN [id number]) as of 02/17/2021 15:11  Ref. Range 02/16/2021 23:03 02/17/2021 06:53 02/17/2021 07:29 02/17/2021 14:24 02/17/2021 14:59  Glucose-Capillary Latest Ref Range: 70 - 99 mg/dL 108 (H) 56 (L) 102 (H) 57 (L) 45 (L)    Diabetes history: DM 2 Outpatient Diabetes medications:  U500 50 units with breakfast, 70 units with lunch and 60 units with dinner Current orders for Inpatient glycemic control:  Novolog 0-6 units tid with meals and HS Lantus 40 units bid  Inpatient Diabetes Program Recommendations:    Note low blood sugars. Please consider reducing Lantus to 30 units bid.   Thanks,  Adah Perl, RN, BC-ADM Inpatient Diabetes Coordinator Pager [phone] (8a-5p)

## 2021-02-17 NOTE — Progress Notes (Addendum)
Progress Note  Patient Name: [name] [name] Date of Encounter: 02/17/2021  [facility] Cardiologist: Lauree Chandler, MD   Subjective   Sleeping until awoken. No chest pain. Hungry. No questions regarding cath procedure today.   Inpatient Medications    Scheduled Meds: . aspirin EC  81 mg Oral Daily  . atorvastatin  80 mg Oral Daily  . Chlorhexidine Gluconate Cloth  6 each Topical Daily  . cinacalcet  30 mg Oral Q supper  . clonazePAM  0.5 mg Oral BID  . dextrose      . ferric citrate  420 mg Oral TID WC  . insulin aspart  0-5 Units Subcutaneous QHS  . insulin aspart  0-6 Units Subcutaneous TID WC  . insulin glargine  40 Units Subcutaneous BID  . midodrine  10 mg Oral TID with meals  . patiromer  8.4 g Oral Daily  . senna-docusate  2 tablet Oral BID  . sevelamer carbonate  1,600 mg Oral TID WC  . sodium chloride flush  3 mL Intravenous Q12H   Continuous Infusions: . sodium chloride    . sodium chloride 10 mL/hr at 02/17/21 0553  . heparin 2,250 Units/hr (02/17/21 0409)   PRN Meds: sodium chloride, acetaminophen, bisacodyl, bisacodyl, fentaNYL (SUBLIMAZE) injection, loperamide, LORazepam, melatonin, nitroGLYCERIN, prochlorperazine, sodium chloride flush   Vital Signs    Vitals:   02/16/21 1648 02/16/21 2000 02/16/21 2337 02/17/21 0700  BP: 116/72 (!) 85/61 (!) 85/50   Pulse: 100 65 98   Resp: 20 16 20    Temp: 97.7 F (36.5 C) 98.1 F (36.7 C) 98.1 F (36.7 C)   TempSrc: Axillary Oral Oral   SpO2:   100%   Weight:    (!) 136.4 kg  Height:    5\' 10"  (1.778 m)   No intake or output data in the 24 hours ending 02/17/21 0827 Last 3 Weights 02/17/2021 02/15/2021 02/14/2021  Weight (lbs) 300 lb 11.3 oz 303 lb 12.7 oz 297 lb 6.4 oz  Weight (kg) 136.4 kg 137.8 kg 134.9 kg  Some encounter information is confidential and restricted. Go to Review Flowsheets activity to see all data.      Telemetry    Afib rates 80-90, intermittent episodes of atrial flutter as  well , 2:1 - Personally Reviewed  ECG    No tracing this morning.  Physical Exam  Pleasant male, laying in bed GEN: No acute distress.   Neck: No JVD Cardiac: Irreg Irreg, no murmurs, rubs, or gallops.  Respiratory: Clear to auscultation bilaterally. HD cath to right upper chest GI: Soft, nontender, non-distended  MS: No edema; No deformity. Neuro:  Nonfocal  Psych: Normal affect   Labs    High Sensitivity Troponin:   Recent Labs  Lab 02/14/21 0024 02/14/21 0328 02/14/21 0551 02/15/21 1634 02/16/21 0731  TROPONINIHS 576* 922* 1,335* 950* 656*      Chemistry Recent Labs  Lab 02/15/21 0927 02/16/21 0734 02/17/21 0627  NA 136 134* 138  K 5.6* 5.2* 4.3  CL 99 100 102  CO2 28 22 24   GLUCOSE 157* 142* 51*  BUN 26* 25* 38*  CREATININE 6.51* 5.46* 7.52*  CALCIUM 8.5* 8.0* 8.5*  GFRNONAA 9* 11* 8*  ANIONGAP 9 12 12      Hematology Recent Labs  Lab 02/15/21 0927 02/16/21 0608 02/17/21 0627  WBC 6.7 7.1 7.4  RBC 3.77* 3.75* 3.66*  HGB 11.1* 11.1* 10.8*  HCT 38.9* 39.0 37.3*  MCV 103.2* 104.0* 101.9*  MCH 29.4 29.6 29.5  MCHC 28.5* 28.5* 29.0*  RDW 21.7* 21.6* 21.2*  PLT 207 213 236    BNPNo results for input(s): BNP, PROBNP in the last 168 hours.   DDimer No results for input(s): DDIMER in the last 168 hours.   Radiology    DG CHEST PORT 1 VIEW  Result Date: 02/16/2021 CLINICAL DATA:  Short of breath, chest pains EXAM: PORTABLE CHEST 1 VIEW COMPARISON:  Prior chest x-ray 02/15/2021 FINDINGS: Well-positioned right IJ tunneled hemodialysis catheter. Catheter tip overlies the mid right atrium. Stable cardiomegaly and mediastinal contours. Atherosclerotic calcifications again noted in the transverse aorta. Similar appearance of pulmonary vascular congestion bordering on mild edema. No evidence of pneumothorax. No large pleural effusion. No acute osseous abnormality. Metallic stents present in the right upper extremity axillary vein. IMPRESSION: 1. Cardiomegaly  and pulmonary vascular congestion bordering on mild edema appears similar compared to 02/14/2021. 2. Stable and except will position of right IJ tunneled hemodialysis catheter. 3. No evidence of pneumothorax. Electronically Signed   By: Jacqulynn Cadet M.D.   On: 02/16/2021 10:04   ECHOCARDIOGRAM COMPLETE  Result Date: 02/15/2021    ECHOCARDIOGRAM REPORT   Patient Name:   [name] Date of Exam: 02/15/2021 Medical Rec #:  [id number]      Height:       70.0 in Accession #:    [id number]     Weight:       303.8 lb Date of Birth:  1966-04-08      BSA:          2.493 m Patient Age:    55 years       BP:           78/40 mmHg Patient Gender: M              HR:           90 bpm. Exam Location:  Inpatient Procedure: 2D Echo, Cardiac Doppler, Color Doppler and Intracardiac            Opacification Agent Indications:    Elevated troponin  History:        Patient has prior history of Echocardiogram examinations, most                 recent 07/29/2020. CHF, Previous Myocardial Infarction, COPD,                 Arrythmias:Atrial Fibrillation, Signs/Symptoms:Murmur; Risk                 Factors:Hypertension, Diabetes and Dyslipidemia.  Sonographer:    Luisa Hart RDCS Referring Phys: [id number] Hulen Shouts RHYNE  Sonographer Comments: 07/27/2020 cath IMPRESSIONS  1. Left ventricular ejection fraction, by estimation, is 25 to 30%. The left ventricle has severely decreased function. The left ventricle demonstrates regional wall motion abnormalities. There is global hypokinesis with disproportionately severe hypokinesis of the left ventricular, mid-apical anteroseptal wall, anterior wall, apical segment and inferior wall. There is moderate concentric left ventricular hypertrophy. Left ventricular diastolic function could not be evaluated.  2. Right ventricular systolic function is moderately reduced. The right ventricular size is moderately enlarged. There is moderately elevated pulmonary artery systolic pressure.  3. Left atrial  size was severely dilated.  4. A small pericardial effusion is present. The pericardial effusion is circumferential. There is no evidence of cardiac tamponade.  5. The mitral valve is degenerative. Mild mitral valve regurgitation. Mild mitral stenosis. The mean mitral valve gradient is 4.8 mmHg. Severe mitral annular calcification.  6. Tricuspid  valve regurgitation is moderate.  7. The aortic valve is tricuspid. There is mild calcification of the aortic valve. There is mild thickening of the aortic valve. Aortic valve regurgitation is mild. Mild aortic valve sclerosis is present, with no evidence of aortic valve stenosis.  8. The inferior vena cava is dilated in size with <50% respiratory variability, suggesting right atrial pressure of 15 mmHg. Comparison(s): The left ventricular function is significantly worse. The left ventricular wall motion abnormalities are significantly worse. There appears to be interval ischemia or infarction in the LAD artery distribution. FINDINGS  Left Ventricle: Left ventricular ejection fraction, by estimation, is 25 to 30%. The left ventricle has severely decreased function. The left ventricle demonstrates regional wall motion abnormalities. There is global hypokinesis with disproportionately severe hypokinesis of the left ventricular, mid-apical anteroseptal wall, anterior wall, apical segment and inferior wall. Definity contrast agent was given IV to delineate the left ventricular endocardial borders. The left ventricular internal cavity size was normal in size. There is moderate concentric left ventricular hypertrophy. Abnormal (paradoxical) septal motion, consistent with left bundle branch block. Left ventricular diastolic function could not be evaluated due to atrial fibrillation. Left ventricular diastolic function could not be evaluated. Right Ventricle: The right ventricular size is moderately enlarged. No increase in right ventricular wall thickness. Right ventricular  systolic function is moderately reduced. There is moderately elevated pulmonary artery systolic pressure. The tricuspid  regurgitant velocity is 3.17 m/s, and with an assumed right atrial pressure of 15 mmHg, the estimated right ventricular systolic pressure is 16.1 mmHg. Left Atrium: Left atrial size was severely dilated. Right Atrium: Right atrial size was normal in size. Pericardium: A small pericardial effusion is present. The pericardial effusion is circumferential. There is no evidence of cardiac tamponade. Mitral Valve: The mitral valve is degenerative in appearance. Severe mitral annular calcification. Mild mitral valve regurgitation. Mild mitral valve stenosis. The mean mitral valve gradient is 4.8 mmHg with average heart rate of 85 bpm. Tricuspid Valve: The tricuspid valve is normal in structure. Tricuspid valve regurgitation is moderate. Aortic Valve: The aortic valve is tricuspid. There is mild calcification of the aortic valve. There is mild thickening of the aortic valve. Aortic valve regurgitation is mild. Aortic regurgitation PHT measures 336 msec. Mild aortic valve sclerosis is present, with no evidence of aortic valve stenosis. Aortic valve mean gradient measures 5.0 mmHg. Aortic valve peak gradient measures 10.1 mmHg. Aortic valve area, by VTI measures 2.10 cm. Pulmonic Valve: The pulmonic valve was not well visualized. Pulmonic valve regurgitation is not visualized. Aorta: The aortic root and ascending aorta are structurally normal, with no evidence of dilitation. Venous: The inferior vena cava is dilated in size with less than 50% respiratory variability, suggesting right atrial pressure of 15 mmHg. IAS/Shunts: No atrial level shunt detected by color flow Doppler.  LEFT VENTRICLE PLAX 2D LVIDd:         4.90 cm      Diastology LVIDs:         3.20 cm      LV e' medial:  3.60 cm/s LV PW:         1.50 cm      LV e' lateral: 5.80 cm/s LV IVS:        1.40 cm LVOT diam:     2.40 cm LV SV:         59  LV SV Index:   24 LVOT Area:     4.52 cm  LV Volumes (MOD) LV vol  d, MOD A2C: 146.0 ml LV vol d, MOD A4C: 154.0 ml LV vol s, MOD A2C: 116.0 ml LV vol s, MOD A4C: 96.4 ml LV SV MOD A2C:     30.0 ml LV SV MOD A4C:     154.0 ml LV SV MOD BP:      45.4 ml RIGHT VENTRICLE RV S prime:     7.03 cm/s TAPSE (M-mode): 1.4 cm LEFT ATRIUM             Index LA diam:        6.10 cm 2.45 cm/m LA Vol (A2C):   86.4 ml 34.66 ml/m LA Vol (A4C):   77.5 ml 31.09 ml/m LA Biplane Vol: 83.4 ml 33.45 ml/m  AORTIC VALVE                    PULMONIC VALVE AV Area (Vmax):    2.23 cm     PV Vmax:       0.88 m/s AV Area (Vmean):   2.07 cm     PV Vmean:      72.400 cm/s AV Area (VTI):     2.10 cm     PV VTI:        0.160 m AV Vmax:           159.00 cm/s  PV Peak grad:  3.1 mmHg AV Vmean:          105.500 cm/s PV Mean grad:  2.0 mmHg AV VTI:            0.280 m AV Peak Grad:      10.1 mmHg AV Mean Grad:      5.0 mmHg LVOT Vmax:         78.30 cm/s LVOT Vmean:        48.200 cm/s LVOT VTI:          0.130 m LVOT/AV VTI ratio: 0.46 AI PHT:            336 msec  AORTA Ao Root diam: 3.50 cm Ao Asc diam:  3.40 cm MITRAL VALVE              TRICUSPID VALVE MV Area (PHT): 3.76 cm   TR Peak grad:   40.2 mmHg MV Mean grad:  4.8 mmHg   TR Vmax:        317.00 cm/s MV Decel Time: 202 msec MR Peak grad: 45.2 mmHg   SHUNTS MR Mean grad: 30.0 mmHg   Systemic VTI:  0.13 m MR Vmax:      336.00 cm/s Systemic Diam: 2.40 cm MR Vmean:     259.0 cm/s Sanda Klein MD Electronically signed by Sanda Klein MD Signature Date/Time: 02/15/2021/1:32:24 PM    Final     Cardiac Studies   Echo: 02/15/21  1. Left ventricular ejection fraction, by estimation, is 25 to 30%. The  left ventricle has severely decreased function. The left ventricle  demonstrates regional wall motion abnormalities. There is global  hypokinesis with disproportionately severe  hypokinesis of the left ventricular, mid-apical anteroseptal wall,  anterior wall, apical segment and inferior wall.  There is moderate  concentric left ventricular hypertrophy. Left ventricular diastolic  function could not be evaluated.  2. Right ventricular systolic function is moderately reduced. The right  ventricular size is moderately enlarged. There is moderately elevated  pulmonary artery systolic pressure.  3. Left atrial size was severely dilated.  4. A small pericardial effusion is present. The pericardial effusion is  circumferential. There is no evidence of cardiac tamponade.  5. The mitral valve is degenerative. Mild mitral valve regurgitation.  Mild mitral stenosis. The mean mitral valve gradient is 4.8 mmHg. Severe  mitral annular calcification.  6. Tricuspid valve regurgitation is moderate.  7. The aortic valve is tricuspid. There is mild calcification of the  aortic valve. There is mild thickening of the aortic valve. Aortic valve  regurgitation is mild. Mild aortic valve sclerosis is present, with no  evidence of aortic valve stenosis.  8. The inferior vena cava is dilated in size with <50% respiratory  variability, suggesting right atrial pressure of 15 mmHg.   Patient Profile     55 y.o. male with PMH of ESRD on HD, CAD s/p PCI/DES of the LAD 07/2020, HTN, HLD, Atrial fib/flutter, DM, DVT, HFrEF (prior EF of 40-45%), COPD who presented difficulties with his HD cath but found to have elevated troponin and further reduced EF.   Assessment & Plan    1. Acute on chronic combined CHF: EF notably reduced further this admission at 25-30% (prior 40-45%) with elevated troponin. Planned for cardiac cath today to reassess coronary anatomy. Has not really had anginal symptoms but with decline in EF, elevated troponin will need cath.  -- coumadin has been held, on IV heparin. INR 1.5 this morning -- on ASA, statin, unable to add BB with soft bps requiring midodrine. No ACE/ARB with ESRD  2. CAD s/p LAD 07/2020: had been on plavix PTA, but does not appear this was continued on admission.  Notes do not given indication for possible hold? Did have VVS intervention with new tunnel dialysis catheter.  -- discuss resumption of plavix with MD  3. Atrial fib/flutter: coumadin held with plans for cath. INR 1.5 this morning.  -- on IV heparin  4. ESRD on HD: per nephrology  5. Hypotension: struggles with low Bps. On Midodrine, therefore limited in options for medical therapy with CHF.   6. HLD: on Lipitor 80mg  daily -- LDL 31, HDL 23  7. DM: on SSI  For questions or updates, please contact [place] Please consult [email or web address] for contact info under        Signed, Reino Bellis, NP  02/17/2021, 8:27 AM     Patient seen and examined. Agree with assessment and plan.  At present, patient is not having any recurrent chest pain.  He is status post non-STEMI subtotal proximal LAD stenosis treated successfully with DES stenting in October 2021.  He had concomitant CAD with total occlusion of an inferior branch of the ramus vessel and distal OM vessel.  LV function has now deteriorated with a EF in the 25 to 30% by recent echo.  I discussed the need for right and left heart catheterization as scheduled for today.  The patient is aware of the risk benefits of the procedure.  Warfarin has been held.  INR 1.5.  Peak troponin 1335.  Patient has end-stage renal disease on hemodialysis.  He has body habitus significant for high likelihood for OSA and consider outpatient subsequent evaluation.    Troy Sine, MD, [facility] 02/17/2021 11:21 AM

## 2021-02-17 NOTE — H&P (View-Only) (Signed)
Progress Note  Patient Name: [name] [name] Date of Encounter: 02/17/2021  [facility] Cardiologist: Lauree Chandler, MD   Subjective   Sleeping until awoken. No chest pain. Hungry. No questions regarding cath procedure today.   Inpatient Medications    Scheduled Meds: . aspirin EC  81 mg Oral Daily  . atorvastatin  80 mg Oral Daily  . Chlorhexidine Gluconate Cloth  6 each Topical Daily  . cinacalcet  30 mg Oral Q supper  . clonazePAM  0.5 mg Oral BID  . dextrose      . ferric citrate  420 mg Oral TID WC  . insulin aspart  0-5 Units Subcutaneous QHS  . insulin aspart  0-6 Units Subcutaneous TID WC  . insulin glargine  40 Units Subcutaneous BID  . midodrine  10 mg Oral TID with meals  . patiromer  8.4 g Oral Daily  . senna-docusate  2 tablet Oral BID  . sevelamer carbonate  1,600 mg Oral TID WC  . sodium chloride flush  3 mL Intravenous Q12H   Continuous Infusions: . sodium chloride    . sodium chloride 10 mL/hr at 02/17/21 0553  . heparin 2,250 Units/hr (02/17/21 0409)   PRN Meds: sodium chloride, acetaminophen, bisacodyl, bisacodyl, fentaNYL (SUBLIMAZE) injection, loperamide, LORazepam, melatonin, nitroGLYCERIN, prochlorperazine, sodium chloride flush   Vital Signs    Vitals:   02/16/21 1648 02/16/21 2000 02/16/21 2337 02/17/21 0700  BP: 116/72 (!) 85/61 (!) 85/50   Pulse: 100 65 98   Resp: 20 16 20    Temp: 97.7 F (36.5 C) 98.1 F (36.7 C) 98.1 F (36.7 C)   TempSrc: Axillary Oral Oral   SpO2:   100%   Weight:    (!) 136.4 kg  Height:    5\' 10"  (1.778 m)   No intake or output data in the 24 hours ending 02/17/21 0827 Last 3 Weights 02/17/2021 02/15/2021 02/14/2021  Weight (lbs) 300 lb 11.3 oz 303 lb 12.7 oz 297 lb 6.4 oz  Weight (kg) 136.4 kg 137.8 kg 134.9 kg  Some encounter information is confidential and restricted. Go to Review Flowsheets activity to see all data.      Telemetry    Afib rates 80-90, intermittent episodes of atrial flutter as  well , 2:1 - Personally Reviewed  ECG    No tracing this morning.  Physical Exam  Pleasant male, laying in bed GEN: No acute distress.   Neck: No JVD Cardiac: Irreg Irreg, no murmurs, rubs, or gallops.  Respiratory: Clear to auscultation bilaterally. HD cath to right upper chest GI: Soft, nontender, non-distended  MS: No edema; No deformity. Neuro:  Nonfocal  Psych: Normal affect   Labs    High Sensitivity Troponin:   Recent Labs  Lab 02/14/21 0024 02/14/21 0328 02/14/21 0551 02/15/21 1634 02/16/21 0731  TROPONINIHS 576* 922* 1,335* 950* 656*      Chemistry Recent Labs  Lab 02/15/21 0927 02/16/21 0734 02/17/21 0627  NA 136 134* 138  K 5.6* 5.2* 4.3  CL 99 100 102  CO2 28 22 24   GLUCOSE 157* 142* 51*  BUN 26* 25* 38*  CREATININE 6.51* 5.46* 7.52*  CALCIUM 8.5* 8.0* 8.5*  GFRNONAA 9* 11* 8*  ANIONGAP 9 12 12      Hematology Recent Labs  Lab 02/15/21 0927 02/16/21 0608 02/17/21 0627  WBC 6.7 7.1 7.4  RBC 3.77* 3.75* 3.66*  HGB 11.1* 11.1* 10.8*  HCT 38.9* 39.0 37.3*  MCV 103.2* 104.0* 101.9*  MCH 29.4 29.6 29.5  MCHC 28.5* 28.5* 29.0*  RDW 21.7* 21.6* 21.2*  PLT 207 213 236    BNPNo results for input(s): BNP, PROBNP in the last 168 hours.   DDimer No results for input(s): DDIMER in the last 168 hours.   Radiology    DG CHEST PORT 1 VIEW  Result Date: 02/16/2021 CLINICAL DATA:  Short of breath, chest pains EXAM: PORTABLE CHEST 1 VIEW COMPARISON:  Prior chest x-ray 02/15/2021 FINDINGS: Well-positioned right IJ tunneled hemodialysis catheter. Catheter tip overlies the mid right atrium. Stable cardiomegaly and mediastinal contours. Atherosclerotic calcifications again noted in the transverse aorta. Similar appearance of pulmonary vascular congestion bordering on mild edema. No evidence of pneumothorax. No large pleural effusion. No acute osseous abnormality. Metallic stents present in the right upper extremity axillary vein. IMPRESSION: 1. Cardiomegaly  and pulmonary vascular congestion bordering on mild edema appears similar compared to 02/14/2021. 2. Stable and except will position of right IJ tunneled hemodialysis catheter. 3. No evidence of pneumothorax. Electronically Signed   By: Jacqulynn Cadet M.D.   On: 02/16/2021 10:04   ECHOCARDIOGRAM COMPLETE  Result Date: 02/15/2021    ECHOCARDIOGRAM REPORT   Patient Name:   [name] [name] Date of Exam: 02/15/2021 Medical Rec #:  [id number]      Height:       70.0 in Accession #:    [id number]     Weight:       303.8 lb Date of Birth:  1966/04/02      BSA:          2.493 m Patient Age:    55 years       BP:           78/40 mmHg Patient Gender: M              HR:           90 bpm. Exam Location:  Inpatient Procedure: 2D Echo, Cardiac Doppler, Color Doppler and Intracardiac            Opacification Agent Indications:    Elevated troponin  History:        Patient has prior history of Echocardiogram examinations, most                 recent 07/29/2020. CHF, Previous Myocardial Infarction, COPD,                 Arrythmias:Atrial Fibrillation, Signs/Symptoms:Murmur; Risk                 Factors:Hypertension, Diabetes and Dyslipidemia.  Sonographer:    Luisa Hart RDCS Referring Phys: [id number] Hulen Shouts RHYNE  Sonographer Comments: 07/27/2020 cath IMPRESSIONS  1. Left ventricular ejection fraction, by estimation, is 25 to 30%. The left ventricle has severely decreased function. The left ventricle demonstrates regional wall motion abnormalities. There is global hypokinesis with disproportionately severe hypokinesis of the left ventricular, mid-apical anteroseptal wall, anterior wall, apical segment and inferior wall. There is moderate concentric left ventricular hypertrophy. Left ventricular diastolic function could not be evaluated.  2. Right ventricular systolic function is moderately reduced. The right ventricular size is moderately enlarged. There is moderately elevated pulmonary artery systolic pressure.  3. Left atrial  size was severely dilated.  4. A small pericardial effusion is present. The pericardial effusion is circumferential. There is no evidence of cardiac tamponade.  5. The mitral valve is degenerative. Mild mitral valve regurgitation. Mild mitral stenosis. The mean mitral valve gradient is 4.8 mmHg. Severe mitral annular calcification.  6. Tricuspid  valve regurgitation is moderate.  7. The aortic valve is tricuspid. There is mild calcification of the aortic valve. There is mild thickening of the aortic valve. Aortic valve regurgitation is mild. Mild aortic valve sclerosis is present, with no evidence of aortic valve stenosis.  8. The inferior vena cava is dilated in size with <50% respiratory variability, suggesting right atrial pressure of 15 mmHg. Comparison(s): The left ventricular function is significantly worse. The left ventricular wall motion abnormalities are significantly worse. There appears to be interval ischemia or infarction in the LAD artery distribution. FINDINGS  Left Ventricle: Left ventricular ejection fraction, by estimation, is 25 to 30%. The left ventricle has severely decreased function. The left ventricle demonstrates regional wall motion abnormalities. There is global hypokinesis with disproportionately severe hypokinesis of the left ventricular, mid-apical anteroseptal wall, anterior wall, apical segment and inferior wall. Definity contrast agent was given IV to delineate the left ventricular endocardial borders. The left ventricular internal cavity size was normal in size. There is moderate concentric left ventricular hypertrophy. Abnormal (paradoxical) septal motion, consistent with left bundle branch block. Left ventricular diastolic function could not be evaluated due to atrial fibrillation. Left ventricular diastolic function could not be evaluated. Right Ventricle: The right ventricular size is moderately enlarged. No increase in right ventricular wall thickness. Right ventricular  systolic function is moderately reduced. There is moderately elevated pulmonary artery systolic pressure. The tricuspid  regurgitant velocity is 3.17 m/s, and with an assumed right atrial pressure of 15 mmHg, the estimated right ventricular systolic pressure is 12.8 mmHg. Left Atrium: Left atrial size was severely dilated. Right Atrium: Right atrial size was normal in size. Pericardium: A small pericardial effusion is present. The pericardial effusion is circumferential. There is no evidence of cardiac tamponade. Mitral Valve: The mitral valve is degenerative in appearance. Severe mitral annular calcification. Mild mitral valve regurgitation. Mild mitral valve stenosis. The mean mitral valve gradient is 4.8 mmHg with average heart rate of 85 bpm. Tricuspid Valve: The tricuspid valve is normal in structure. Tricuspid valve regurgitation is moderate. Aortic Valve: The aortic valve is tricuspid. There is mild calcification of the aortic valve. There is mild thickening of the aortic valve. Aortic valve regurgitation is mild. Aortic regurgitation PHT measures 336 msec. Mild aortic valve sclerosis is present, with no evidence of aortic valve stenosis. Aortic valve mean gradient measures 5.0 mmHg. Aortic valve peak gradient measures 10.1 mmHg. Aortic valve area, by VTI measures 2.10 cm. Pulmonic Valve: The pulmonic valve was not well visualized. Pulmonic valve regurgitation is not visualized. Aorta: The aortic root and ascending aorta are structurally normal, with no evidence of dilitation. Venous: The inferior vena cava is dilated in size with less than 50% respiratory variability, suggesting right atrial pressure of 15 mmHg. IAS/Shunts: No atrial level shunt detected by color flow Doppler.  LEFT VENTRICLE PLAX 2D LVIDd:         4.90 cm      Diastology LVIDs:         3.20 cm      LV e' medial:  3.60 cm/s LV PW:         1.50 cm      LV e' lateral: 5.80 cm/s LV IVS:        1.40 cm LVOT diam:     2.40 cm LV SV:         59  LV SV Index:   24 LVOT Area:     4.52 cm  LV Volumes (MOD) LV vol  d, MOD A2C: 146.0 ml LV vol d, MOD A4C: 154.0 ml LV vol s, MOD A2C: 116.0 ml LV vol s, MOD A4C: 96.4 ml LV SV MOD A2C:     30.0 ml LV SV MOD A4C:     154.0 ml LV SV MOD BP:      45.4 ml RIGHT VENTRICLE RV S prime:     7.03 cm/s TAPSE (M-mode): 1.4 cm LEFT ATRIUM             Index LA diam:        6.10 cm 2.45 cm/m LA Vol (A2C):   86.4 ml 34.66 ml/m LA Vol (A4C):   77.5 ml 31.09 ml/m LA Biplane Vol: 83.4 ml 33.45 ml/m  AORTIC VALVE                    PULMONIC VALVE AV Area (Vmax):    2.23 cm     PV Vmax:       0.88 m/s AV Area (Vmean):   2.07 cm     PV Vmean:      72.400 cm/s AV Area (VTI):     2.10 cm     PV VTI:        0.160 m AV Vmax:           159.00 cm/s  PV Peak grad:  3.1 mmHg AV Vmean:          105.500 cm/s PV Mean grad:  2.0 mmHg AV VTI:            0.280 m AV Peak Grad:      10.1 mmHg AV Mean Grad:      5.0 mmHg LVOT Vmax:         78.30 cm/s LVOT Vmean:        48.200 cm/s LVOT VTI:          0.130 m LVOT/AV VTI ratio: 0.46 AI PHT:            336 msec  AORTA Ao Root diam: 3.50 cm Ao Asc diam:  3.40 cm MITRAL VALVE              TRICUSPID VALVE MV Area (PHT): 3.76 cm   TR Peak grad:   40.2 mmHg MV Mean grad:  4.8 mmHg   TR Vmax:        317.00 cm/s MV Decel Time: 202 msec MR Peak grad: 45.2 mmHg   SHUNTS MR Mean grad: 30.0 mmHg   Systemic VTI:  0.13 m MR Vmax:      336.00 cm/s Systemic Diam: 2.40 cm MR Vmean:     259.0 cm/s Sanda Klein MD Electronically signed by Sanda Klein MD Signature Date/Time: 02/15/2021/1:32:24 PM    Final     Cardiac Studies   Echo: 02/15/21  1. Left ventricular ejection fraction, by estimation, is 25 to 30%. The  left ventricle has severely decreased function. The left ventricle  demonstrates regional wall motion abnormalities. There is global  hypokinesis with disproportionately severe  hypokinesis of the left ventricular, mid-apical anteroseptal wall,  anterior wall, apical segment and inferior wall.  There is moderate  concentric left ventricular hypertrophy. Left ventricular diastolic  function could not be evaluated.  2. Right ventricular systolic function is moderately reduced. The right  ventricular size is moderately enlarged. There is moderately elevated  pulmonary artery systolic pressure.  3. Left atrial size was severely dilated.  4. A small pericardial effusion is present. The pericardial effusion is  circumferential. There is no evidence of cardiac tamponade.  5. The mitral valve is degenerative. Mild mitral valve regurgitation.  Mild mitral stenosis. The mean mitral valve gradient is 4.8 mmHg. Severe  mitral annular calcification.  6. Tricuspid valve regurgitation is moderate.  7. The aortic valve is tricuspid. There is mild calcification of the  aortic valve. There is mild thickening of the aortic valve. Aortic valve  regurgitation is mild. Mild aortic valve sclerosis is present, with no  evidence of aortic valve stenosis.  8. The inferior vena cava is dilated in size with <50% respiratory  variability, suggesting right atrial pressure of 15 mmHg.   Patient Profile     56 y.o. male with PMH of ESRD on HD, CAD s/p PCI/DES of the LAD 07/2020, HTN, HLD, Atrial fib/flutter, DM, DVT, HFrEF (prior EF of 40-45%), COPD who presented difficulties with his HD cath but found to have elevated troponin and further reduced EF.   Assessment & Plan    1. Acute on chronic combined CHF: EF notably reduced further this admission at 25-30% (prior 40-45%) with elevated troponin. Planned for cardiac cath today to reassess coronary anatomy. Has not really had anginal symptoms but with decline in EF, elevated troponin will need cath.  -- coumadin has been held, on IV heparin. INR 1.5 this morning -- on ASA, statin, unable to add BB with soft bps requiring midodrine. No ACE/ARB with ESRD  2. CAD s/p LAD 07/2020: had been on plavix PTA, but does not appear this was continued on admission.  Notes do not given indication for possible hold? Did have VVS intervention with new tunnel dialysis catheter.  -- discuss resumption of plavix with MD  3. Atrial fib/flutter: coumadin held with plans for cath. INR 1.5 this morning.  -- on IV heparin  4. ESRD on HD: per nephrology  5. Hypotension: struggles with low Bps. On Midodrine, therefore limited in options for medical therapy with CHF.   6. HLD: on Lipitor 80mg  daily -- LDL 31, HDL 23  7. DM: on SSI  For questions or updates, please contact [facility] Please consult [email or web address] for contact info under        Signed, Reino Bellis, NP  02/17/2021, 8:27 AM     Patient seen and examined. Agree with assessment and plan.  At present, patient is not having any recurrent chest pain.  He is status post non-STEMI subtotal proximal LAD stenosis treated successfully with DES stenting in October 2021.  He had concomitant CAD with total occlusion of an inferior branch of the ramus vessel and distal OM vessel.  LV function has now deteriorated with a EF in the 25 to 30% by recent echo.  I discussed the need for right and left heart catheterization as scheduled for today.  The patient is aware of the risk benefits of the procedure.  Warfarin has been held.  INR 1.5.  Peak troponin 1335.  Patient has end-stage renal disease on hemodialysis.  He has body habitus significant for high likelihood for OSA and consider outpatient subsequent evaluation.    Troy Sine, MD, [facility] 02/17/2021 11:21 AM

## 2021-02-17 NOTE — Progress Notes (Signed)
PROGRESS NOTE    [name] [name]  YCX:[id number] DOB: [birth year] DOA: 02/13/2021 PCP: Hoyt Koch, MD    Chief Complaint  Patient presents with  . Chest Pain    Vascular Access Malfunction     Brief Narrative:  55 year old gentleman with prior history of end-stage renal disease on dialysis on Tuesdays Thursdays and Saturdays, multiple upper extremity AV access procedures, recent tunneled HD catheter placed recently appears to be malfunctioning and he could not complete his dialysis on 02/13/2021.  He was brought into ED for possible exchange of the catheter.  While in the ED patient reports he had atypical chest pain. Chest pain has since resolved. He underwent HD catheter exchange on 02/14/21.  As per the patient, he has been feeling very weak and sob since October 2021.  Pt seen and examined at bedside. Rectal pain from hemorrhoids.    Assessment & Plan:   Active Problems:   HLD (hyperlipidemia)   OSA (obstructive sleep apnea)   Severe obesity (BMI >= 40) (HCC)   Diabetes mellitus, type 2 (HCC)   Diabetic peripheral neuropathy (HCC)   Anemia due to chronic kidney disease   HFrEF (heart failure with reduced ejection fraction) (HCC)   ESRD on dialysis (HCC)   Hyperkalemia   Iron deficiency anemia, unspecified   Atrial Fibrillation/Flutter   Hypotension   Chest pain   Chest pain:  Intermittent, none today.   Minimally elevated troponins.  Cardiology on board., and in view of his decrease in the LVEF to 25% from 45 % last October 2021, recommended LHC  He was started on IV heparin and continue with aspirin.   Plan for LHC.     ESRD ON HD TTS scheduled.  Follows up with Dr Carolin Sicks as outpatient.  Hd as per Nephrology.   Hyperkalemia  On valtessa. Resolved.    H/o CAD s/p PCI IN 10/21 Currently chest pain free.  Continue with aspirin and lipitor.    Diabetes mellitus:  uncontrolled wtith hypoglycemia.  Insulin dependent with ESRD.  CBG (last 3)  Recent  Labs    02/16/21 2303 02/17/21 0653 02/17/21 0729  GLUCAP 108* 56* 102*   Resume home meds.  Last A1c is 7.2% currently taking 40 units of lantus BID.  Hypoglycemia probably from NPO.   HD catheter malfunction:  Replaced by vascular surgery    Hypotension:  Chronic and is on midodrine.    Chronic systolic heart failure:  Fluid management with HD     Atrial flutter  Rate controlled and on coumadin for anti coagulation, which has been held for [facility]  and transitioned to IV heparin.  Sub therapeutc INR.     Anemia of chronic disease secondary to ESRD:  Baseline hemoglobin around 11 and stable.    OSA;  On CPAP at home.   Body mass index is 43.15 kg/m. Morbid obesity.  Outpatient follow up with PCp.    DVT prophylaxis: heparin.  Code Status: full code.  Family Communication: none at bedside Disposition:   Status is: Inpatient  Remains inpatient appropriate because:Ongoing diagnostic testing needed not appropriate for outpatient work up and IV treatments appropriate due to intensity of illness or inability to take PO   Dispo:  Patient From: Home  Planned Disposition: Home with Health Care S[facility]  Medically stable for discharge: No         Consultants:   Vascular surgery.   Nephrology  Cardiology.   Procedures:HD catheter exchange.    Antimicrobials: none.  Subjective: Requesting for TUCKS.   Objective: Vitals:   02/16/21 2000 02/16/21 2337 02/17/21 0700 02/17/21 1246  BP: (!) 85/61 (!) 85/50    Pulse: 65 98    Resp: 16 20    Temp: 98.1 F (36.7 C) 98.1 F (36.7 C)    TempSrc: Oral Oral    SpO2:  100%  96%  Weight:   (!) 136.4 kg   Height:   5\' 10"  (1.778 m)     Intake/Output Summary (Last 24 hours) at 02/17/2021 1300 Last data filed at 02/17/2021 1212 Gross per 24 hour  Intake 540.67 ml  Output --  Net 540.67 ml   Filed Weights   02/14/21 0437 02/15/21 0426 02/17/21 0700  Weight: 134.9 kg (!) 137.8 kg (!) 136.4 kg     Examination:  General exam: Well-developed gentleman not in any kind of distress Respiratory system: Air entry fair, no wheezing or rhonchi Cardiovascular system: S1-S2 heard, regular rate rhythm, no JVD Gastrointestinal system: Abdomen is soft nontender bowel sounds normal Central nervous system: Alert and oriented, grossly nonfocal Extremities: No cyanosis Skin: No rashes seen Psychiatry: .  Mood is appropriate   Data Reviewed: I have personally reviewed following labs and imaging studies  CBC: Recent Labs  Lab 02/13/21 2240 02/14/21 0328 02/14/21 1356 02/15/21 0927 02/16/21 0608 02/17/21 0627  WBC 8.2 7.4  --  6.7 7.1 7.4  HGB 11.4* 11.2* 12.6* 11.1* 11.1* 10.8*  HCT 39.9 38.9* 37.0* 38.9* 39.0 37.3*  MCV 102.0* 101.8*  --  103.2* 104.0* 101.9*  PLT 223 219  --  207 213 096    Basic Metabolic Panel: Recent Labs  Lab 02/13/21 2240 02/14/21 0328 02/14/21 1356 02/15/21 0927 02/16/21 0734 02/17/21 0627  NA 137  --  138 136 134* 138  K 4.7  --  5.1 5.6* 5.2* 4.3  CL 97*  --  100 99 100 102  CO2 32  --   --  28 22 24   GLUCOSE 298*  --  104* 157* 142* 51*  BUN 36*  --  40* 26* 25* 38*  CREATININE 7.70* 7.81* 10.10* 6.51* 5.46* 7.52*  CALCIUM 8.6*  --   --  8.5* 8.0* 8.5*  MG  --   --   --  2.2  --   --   PHOS  --   --   --  5.2*  --   --     GFR: Estimated Creatinine Clearance: 15.1 mL/min (A) (by C-G formula based on SCr of 7.52 mg/dL (H)).  Liver Function Tests: No results for input(s): AST, ALT, ALKPHOS, BILITOT, PROT, ALBUMIN in the last 168 hours.  CBG: Recent Labs  Lab 02/16/21 1113 02/16/21 1645 02/16/21 2303 02/17/21 0653 02/17/21 0729  GLUCAP 179* 134* 108* 56* 102*     Recent Results (from the past 240 hour(s))  Resp Panel by RT-PCR (Flu A&B, Covid) Nasopharyngeal Swab     Status: None   Collection Time: 02/13/21 10:38 PM   Specimen: Nasopharyngeal Swab; Nasopharyngeal(NP) swabs in vial transport medium  Result Value Ref Range  Status   SARS Coronavirus 2 by RT PCR NEGATIVE NEGATIVE Final    Comment: (NOTE) SARS-CoV-2 target nucleic acids are NOT DETECTED.  The SARS-CoV-2 RNA is generally detectable in upper respiratory specimens during the acute phase of infection. The lowest concentration of SARS-CoV-2 viral copies this assay can detect is 138 copies/mL. A negative result does not preclude SARS-Cov-2 infection and should not be used as the sole basis for  treatment or other patient management decisions. A negative result may occur with  improper specimen collection/handling, submission of specimen other than nasopharyngeal swab, presence of viral mutation(s) within the areas targeted by this assay, and inadequate number of viral copies(<138 copies/mL). A negative result must be combined with clinical observations, patient history, and epidemiological information. The expected result is Negative.  Fact Sheet for Patients:  EntrepreneurPulse.com.au  Fact Sheet for Healthcare Providers:  IncredibleEmployment.be  This test is no t yet approved or cleared by the [place] FDA and  has been authorized for detection and/or diagnosis of SARS-CoV-2 by FDA under an Emergency Use Authorization (EUA). This EUA will remain  in effect (meaning this test can be used) for the duration of the COVID-19 declaration under Section 564(b)(1) of the Act, 21 U.S.C.section 360bbb-3(b)(1), unless the authorization is terminated  or revoked sooner.       Influenza A by PCR NEGATIVE NEGATIVE Final   Influenza B by PCR NEGATIVE NEGATIVE Final    Comment: (NOTE) The Xpert Xpress SARS-CoV-2/FLU/RSV plus assay is intended as an aid in the diagnosis of influenza from Nasopharyngeal swab specimens and should not be used as a sole basis for treatment. Nasal washings and aspirates are unacceptable for Xpert Xpress SARS-CoV-2/FLU/RSV testing.  Fact Sheet for  Patients: EntrepreneurPulse.com.au  Fact Sheet for Healthcare Providers: IncredibleEmployment.be  This test is not yet approved or cleared by the [place] FDA and has been authorized for detection and/or diagnosis of SARS-CoV-2 by FDA under an Emergency Use Authorization (EUA). This EUA will remain in effect (meaning this test can be used) for the duration of the COVID-19 declaration under Section 564(b)(1) of the Act, 21 U.S.C. section 360bbb-3(b)(1), unless the authorization is terminated or revoked.  Performed at [facility], [place] [address] [place], [place] [address]          Radiology Studies: DG CHEST PORT 1 VIEW  Result Date: 02/16/2021 CLINICAL DATA:  Short of breath, chest pains EXAM: PORTABLE CHEST 1 VIEW COMPARISON:  Prior chest x-ray 02/15/2021 FINDINGS: Well-positioned right IJ tunneled hemodialysis catheter. Catheter tip overlies the mid right atrium. Stable cardiomegaly and mediastinal contours. Atherosclerotic calcifications again noted in the transverse aorta. Similar appearance of pulmonary vascular congestion bordering on mild edema. No evidence of pneumothorax. No large pleural effusion. No acute osseous abnormality. Metallic stents present in the right upper extremity axillary vein. IMPRESSION: 1. Cardiomegaly and pulmonary vascular congestion bordering on mild edema appears similar compared to 02/14/2021. 2. Stable and except will position of right IJ tunneled hemodialysis catheter. 3. No evidence of pneumothorax. Electronically Signed   By: Jacqulynn Cadet M.D.   On: 02/16/2021 10:04        Scheduled Meds: . [MAR Hold] aspirin EC  81 mg Oral Daily  . [MAR Hold] atorvastatin  80 mg Oral Daily  . [MAR Hold] Chlorhexidine Gluconate Cloth  6 each Topical Daily  . [MAR Hold] cinacalcet  30 mg Oral Q supper  . [MAR Hold] clonazePAM  0.5 mg Oral BID  . dextrose      . [MAR Hold] ferric citrate  420 mg Oral TID WC   . hydrocortisone   Rectal QID  . [MAR Hold] insulin aspart  0-5 Units Subcutaneous QHS  . [MAR Hold] insulin aspart  0-6 Units Subcutaneous TID WC  . [MAR Hold] insulin glargine  40 Units Subcutaneous BID  . [MAR Hold] midodrine  10 mg Oral TID with meals  . [MAR Hold] senna-docusate  2 tablet  Oral BID  . [MAR Hold] sevelamer carbonate  1,600 mg Oral TID WC  . sodium chloride flush  3 mL Intravenous Q12H   Continuous Infusions: . sodium chloride    . sodium chloride 10 mL/hr at 02/17/21 0553  . heparin Stopped (02/17/21 1212)     LOS: 3 days       Hosie Poisson, MD [facility]   To contact the attending provider between 7A-7P or the covering provider during after hours 7P-7A, please log into the web site [email or web address] and access using universal [place] password for that web site. If you do not have the password, please call the hospital operator.  02/17/2021, 1:00 PM

## 2021-02-17 NOTE — Progress Notes (Signed)
CBG 57. Drank 4 0z cranberry juice. Skin warm and dry. Will recheck

## 2021-02-17 NOTE — Progress Notes (Addendum)
[facility] Progress Note   Subjective:   Seen in room, sitting up in bed. Denies cp, sob. Plans for LHC today   Objective Vitals:   02/16/21 1648 02/16/21 2000 02/16/21 2337 02/17/21 0700  BP: 116/72 (!) 85/61 (!) 85/50   Pulse: 100 65 98   Resp: 20 16 20    Temp: 97.7 F (36.5 C) 98.1 F (36.7 C) 98.1 F (36.7 C)   TempSrc: Axillary Oral Oral   SpO2:   100%   Weight:    (!) 136.4 kg  Height:    5\' 10"  (1.778 m)   Physical Exam General: Well appearing man, NAD. Nasal O2 in place. Heart: RRR; no murmur Lungs: CTAB Abdomen: soft, non-tender Extremities: No LE edema Dialysis Access: new [facility] R chest  Additional Objective Labs: Basic Metabolic Panel: Recent Labs  Lab 02/15/21 0927 02/16/21 0734 02/17/21 0627  NA 136 134* 138  K 5.6* 5.2* 4.3  CL 99 100 102  CO2 28 22 24   GLUCOSE 157* 142* 51*  BUN 26* 25* 38*  CREATININE 6.51* 5.46* 7.52*  CALCIUM 8.5* 8.0* 8.5*  PHOS 5.2*  --   --    CBC: Recent Labs  Lab 02/13/21 2240 02/14/21 0328 02/14/21 1356 02/15/21 0927 02/16/21 0608 02/17/21 0627  WBC 8.2 7.4  --  6.7 7.1 7.4  HGB 11.4* 11.2*   < > 11.1* 11.1* 10.8*  HCT 39.9 38.9*   < > 38.9* 39.0 37.3*  MCV 102.0* 101.8*  --  103.2* 104.0* 101.9*  PLT 223 219  --  207 213 236   < > = values in this interval not displayed.   CBG: Recent Labs  Lab 02/16/21 1113 02/16/21 1645 02/16/21 2303 02/17/21 0653 02/17/21 0729  GLUCAP 179* 134* 108* 56* 102*   Studies/Results: DG CHEST PORT 1 VIEW  Result Date: 02/16/2021 CLINICAL DATA:  Short of breath, chest pains EXAM: PORTABLE CHEST 1 VIEW COMPARISON:  Prior chest x-ray 02/15/2021 FINDINGS: Well-positioned right IJ tunneled hemodialysis catheter. Catheter tip overlies the mid right atrium. Stable cardiomegaly and mediastinal contours. Atherosclerotic calcifications again noted in the transverse aorta. Similar appearance of pulmonary vascular congestion bordering on mild edema. No evidence of  pneumothorax. No large pleural effusion. No acute osseous abnormality. Metallic stents present in the right upper extremity axillary vein. IMPRESSION: 1. Cardiomegaly and pulmonary vascular congestion bordering on mild edema appears similar compared to 02/14/2021. 2. Stable and except will position of right IJ tunneled hemodialysis catheter. 3. No evidence of pneumothorax. Electronically Signed   By: Jacqulynn Cadet M.D.   On: 02/16/2021 10:04   ECHOCARDIOGRAM COMPLETE  Result Date: 02/15/2021    ECHOCARDIOGRAM REPORT   Patient Name:   [name] [name] Date of Exam: 02/15/2021 Medical Rec #:  [id number]      Height:       70.0 in Accession #:    [id number]     Weight:       303.8 lb Date of Birth:  Mar 17, 1966      BSA:          2.493 m Patient Age:    55 years       BP:           78/40 mmHg Patient Gender: M              HR:           90 bpm. Exam Location:  Inpatient Procedure: 2D Echo, Cardiac Doppler, Color Doppler and Intracardiac  Opacification Agent Indications:    Elevated troponin  History:        Patient has prior history of Echocardiogram examinations, most                 recent 07/29/2020. CHF, Previous Myocardial Infarction, COPD,                 Arrythmias:Atrial Fibrillation, Signs/Symptoms:Murmur; Risk                 Factors:Hypertension, Diabetes and Dyslipidemia.  Sonographer:    Luisa Hart RDCS Referring Phys: [id number] Hulen Shouts RHYNE  Sonographer Comments: 07/27/2020 cath IMPRESSIONS  1. Left ventricular ejection fraction, by estimation, is 25 to 30%. The left ventricle has severely decreased function. The left ventricle demonstrates regional wall motion abnormalities. There is global hypokinesis with disproportionately severe hypokinesis of the left ventricular, mid-apical anteroseptal wall, anterior wall, apical segment and inferior wall. There is moderate concentric left ventricular hypertrophy. Left ventricular diastolic function could not be evaluated.  2. Right ventricular  systolic function is moderately reduced. The right ventricular size is moderately enlarged. There is moderately elevated pulmonary artery systolic pressure.  3. Left atrial size was severely dilated.  4. A small pericardial effusion is present. The pericardial effusion is circumferential. There is no evidence of cardiac tamponade.  5. The mitral valve is degenerative. Mild mitral valve regurgitation. Mild mitral stenosis. The mean mitral valve gradient is 4.8 mmHg. Severe mitral annular calcification.  6. Tricuspid valve regurgitation is moderate.  7. The aortic valve is tricuspid. There is mild calcification of the aortic valve. There is mild thickening of the aortic valve. Aortic valve regurgitation is mild. Mild aortic valve sclerosis is present, with no evidence of aortic valve stenosis.  8. The inferior vena cava is dilated in size with <50% respiratory variability, suggesting right atrial pressure of 15 mmHg. Comparison(s): The left ventricular function is significantly worse. The left ventricular wall motion abnormalities are significantly worse. There appears to be interval ischemia or infarction in the LAD artery distribution. FINDINGS  Left Ventricle: Left ventricular ejection fraction, by estimation, is 25 to 30%. The left ventricle has severely decreased function. The left ventricle demonstrates regional wall motion abnormalities. There is global hypokinesis with disproportionately severe hypokinesis of the left ventricular, mid-apical anteroseptal wall, anterior wall, apical segment and inferior wall. Definity contrast agent was given IV to delineate the left ventricular endocardial borders. The left ventricular internal cavity size was normal in size. There is moderate concentric left ventricular hypertrophy. Abnormal (paradoxical) septal motion, consistent with left bundle branch block. Left ventricular diastolic function could not be evaluated due to atrial fibrillation. Left ventricular diastolic  function could not be evaluated. Right Ventricle: The right ventricular size is moderately enlarged. No increase in right ventricular wall thickness. Right ventricular systolic function is moderately reduced. There is moderately elevated pulmonary artery systolic pressure. The tricuspid  regurgitant velocity is 3.17 m/s, and with an assumed right atrial pressure of 15 mmHg, the estimated right ventricular systolic pressure is 38.8 mmHg. Left Atrium: Left atrial size was severely dilated. Right Atrium: Right atrial size was normal in size. Pericardium: A small pericardial effusion is present. The pericardial effusion is circumferential. There is no evidence of cardiac tamponade. Mitral Valve: The mitral valve is degenerative in appearance. Severe mitral annular calcification. Mild mitral valve regurgitation. Mild mitral valve stenosis. The mean mitral valve gradient is 4.8 mmHg with average heart rate of 85 bpm. Tricuspid Valve: The tricuspid valve is normal in  structure. Tricuspid valve regurgitation is moderate. Aortic Valve: The aortic valve is tricuspid. There is mild calcification of the aortic valve. There is mild thickening of the aortic valve. Aortic valve regurgitation is mild. Aortic regurgitation PHT measures 336 msec. Mild aortic valve sclerosis is present, with no evidence of aortic valve stenosis. Aortic valve mean gradient measures 5.0 mmHg. Aortic valve peak gradient measures 10.1 mmHg. Aortic valve area, by VTI measures 2.10 cm. Pulmonic Valve: The pulmonic valve was not well visualized. Pulmonic valve regurgitation is not visualized. Aorta: The aortic root and ascending aorta are structurally normal, with no evidence of dilitation. Venous: The inferior vena cava is dilated in size with less than 50% respiratory variability, suggesting right atrial pressure of 15 mmHg. IAS/Shunts: No atrial level shunt detected by color flow Doppler.  LEFT VENTRICLE PLAX 2D LVIDd:         4.90 cm      Diastology  LVIDs:         3.20 cm      LV e' medial:  3.60 cm/s LV PW:         1.50 cm      LV e' lateral: 5.80 cm/s LV IVS:        1.40 cm LVOT diam:     2.40 cm LV SV:         59 LV SV Index:   24 LVOT Area:     4.52 cm  LV Volumes (MOD) LV vol d, MOD A2C: 146.0 ml LV vol d, MOD A4C: 154.0 ml LV vol s, MOD A2C: 116.0 ml LV vol s, MOD A4C: 96.4 ml LV SV MOD A2C:     30.0 ml LV SV MOD A4C:     154.0 ml LV SV MOD BP:      45.4 ml RIGHT VENTRICLE RV S prime:     7.03 cm/s TAPSE (M-mode): 1.4 cm LEFT ATRIUM             Index LA diam:        6.10 cm 2.45 cm/m LA Vol (A2C):   86.4 ml 34.66 ml/m LA Vol (A4C):   77.5 ml 31.09 ml/m LA Biplane Vol: 83.4 ml 33.45 ml/m  AORTIC VALVE                    PULMONIC VALVE AV Area (Vmax):    2.23 cm     PV Vmax:       0.88 m/s AV Area (Vmean):   2.07 cm     PV Vmean:      72.400 cm/s AV Area (VTI):     2.10 cm     PV VTI:        0.160 m AV Vmax:           159.00 cm/s  PV Peak grad:  3.1 mmHg AV Vmean:          105.500 cm/s PV Mean grad:  2.0 mmHg AV VTI:            0.280 m AV Peak Grad:      10.1 mmHg AV Mean Grad:      5.0 mmHg LVOT Vmax:         78.30 cm/s LVOT Vmean:        48.200 cm/s LVOT VTI:          0.130 m LVOT/AV VTI ratio: 0.46 AI PHT:            336 msec  AORTA Ao Root diam: 3.50 cm Ao Asc diam:  3.40 cm MITRAL VALVE              TRICUSPID VALVE MV Area (PHT): 3.76 cm   TR Peak grad:   40.2 mmHg MV Mean grad:  4.8 mmHg   TR Vmax:        317.00 cm/s MV Decel Time: 202 msec MR Peak grad: 45.2 mmHg   SHUNTS MR Mean grad: 30.0 mmHg   Systemic VTI:  0.13 m MR Vmax:      336.00 cm/s Systemic Diam: 2.40 cm MR Vmean:     259.0 cm/s Dani Gobble Croitoru MD Electronically signed by Sanda Klein MD Signature Date/Time: 02/15/2021/1:32:24 PM    Final    Medications: . sodium chloride    . sodium chloride 10 mL/hr at 02/17/21 0553  . heparin 2,250 Units/hr (02/17/21 0409)   . aspirin EC  81 mg Oral Daily  . atorvastatin  80 mg Oral Daily  . Chlorhexidine Gluconate Cloth  6 each  Topical Daily  . cinacalcet  30 mg Oral Q supper  . clonazePAM  0.5 mg Oral BID  . dextrose      . ferric citrate  420 mg Oral TID WC  . insulin aspart  0-5 Units Subcutaneous QHS  . insulin aspart  0-6 Units Subcutaneous TID WC  . insulin glargine  40 Units Subcutaneous BID  . midodrine  10 mg Oral TID with meals  . senna-docusate  2 tablet Oral BID  . sevelamer carbonate  1,600 mg Oral TID WC  . sodium chloride flush  3 mL Intravenous Q12H    Dialysis Orders: TTS at [facility] - last 5/3 4:30hr, 250dialyzer, 400/A1.5, EDW 131kg, 2K/2Ca, TDC, no heparin - Finishing course of IV iron, Hgb 11.7 - Calcitriol 1.62mcg PO q HD - On mido, Veltassa, Renvela/Auryxia as binders, PO sensipar 30 qhs.  Assessment/Plan: 1. Malfunctioning TDC catheter: S/p exchange 5/6, per [facility]. 2. Elevated troponin/CAD:Echo with lower EF 25-30%. For LHC today per cardiology.   3. ESRD:Usual TTS schedule. Got short, ineffective HD 5/6. Ran again on 5/7 with net UF 1.7.  Does take Veltassa at home daily and hasn't been getting here. K+ 4.3 this am. Next HD 5/10.  Hypotension/volume:Chronic hypotension (on mido), continue same EDW with HD. Ischemic CM eval as above. 4. Anemia:Hgb 10.8 No ESA needs currently.  5. Metabolic bone disease:Ca/Phos ok. Continue home binders/sensipar. 6. T2DM: Insulin per primary  7. A-Fib:On warfarin - but held for now. Per primary.  Lynnda Child PA-C [facility] 02/17/2021,9:52 AM   Seen and examined independently this AM.  Agree with note and exam as documented above by physician extender and as noted here.  General adult male in bed in no acute distress HEENT normocephalic atraumatic extraocular movements intact sclera anicteric Neck supple trachea midline Lungs clear to auscultation bilaterally normal work of breathing at rest  Heart S1S2 no rub Abdomen soft nontender obese habitus Extremities no pitting edema  Psych normal mood and affect RIJ  tunn catheter  CAD/elevated troponin - for heart cath today per charting Malfunctioning HD catheter - s/p exchange on 5/6 ESRD - usually TTS - for HD tomorrow per TTS schedule. Hasn't needed veltassa here thus far but not on home diet Chronic hypotension on midodrine  Anemia CKD - no acute need for ESA: hb at goal  Claudia Desanctis, MD 02/17/2021 1:28 PM

## 2021-02-17 NOTE — Interval H&P Note (Signed)
History and Physical Interval Note:  02/17/2021 12:32 PM  [name] [name]  has presented today for surgery, with the diagnosis of NSTEMI.  The various methods of treatment have been discussed with the patient and family. After consideration of risks, benefits and other options for treatment, the patient has consented to  Procedure(s): LEFT HEART CATH AND CORONARY ANGIOGRAPHY (N/A) as a surgical intervention.  The patient's history has been reviewed, patient examined, no change in status, stable for surgery.  I have reviewed the patient's chart and labs.  Questions were answered to the patient's satisfaction.    Cath Lab Visit (complete for each Cath Lab visit)  Clinical Evaluation Leading to the Procedure:   ACS: Yes.    Non-ACS:    Anginal Classification: No Symptoms  Anti-ischemic medical therapy: No Therapy  Non-Invasive Test Results: No non-invasive testing performed  Prior CABG: No previous CABG       Lauree Chandler

## 2021-02-17 NOTE — Progress Notes (Signed)
[facility] for heparin Indication: atrial fibrillation  Allergies  Allergen Reactions  . Shellfish-Derived Products Anaphylaxis  . Ace Inhibitors Cough  . Hydrocodone Other (See Comments)    Hallucinations  . Pork-Derived Products Other (See Comments)    Entry determined to be clinically insignificant: OK TO GIVE HEPARIN (has tolerated well in the past)  Patient had allergic episode in 1989 after he ate pork at a fair. He developed fever and rash and went to the ED, but he did not have breathing issues. Received heparin in the past. Patient ok with removing pork allergy and listing as intolerance instead.    Patient Measurements: Height: 5\' 10"  (177.8 cm) Weight: (!) 136.4 kg (300 lb 11.3 oz) (hypoglycemic event) IBW/kg (Calculated) : 73 kg  Vital Signs: Temp: 98.1 F (36.7 C) (05/08 2337) Temp Source: Oral (05/08 2337) BP: 85/50 (05/08 2337) Pulse Rate: 98 (05/08 2337)  Labs: Recent Labs    02/15/21 0927 02/15/21 4650 02/15/21 1634 02/16/21 3546 02/16/21 0731 02/16/21 0734 02/16/21 1400 02/16/21 2231 02/17/21 0627 02/17/21 0729  HGB 11.1*  --   --  11.1*  --   --   --   --  10.8*  --   HCT 38.9*  --   --  39.0  --   --   --   --  37.3*  --   PLT 207  --   --  213  --   --   --   --  236  --   LABPROT 18.3*  --   --  19.8*  --   --   --   --  17.8*  --   INR 1.5*  --   --  1.7*  --   --   --   --  1.5*  --   HEPARINUNFRC  --    < >  --  <0.10*  --   --  <0.10* 0.17*  --  0.35  CREATININE 6.51*  --   --   --   --  5.46*  --   --  7.52*  --   TROPONINIHS  --   --  950*  --  656*  --   --   --   --   --    < > = values in this interval not displayed.    Estimated Creatinine Clearance: 15.1 mL/min (A) (by C-G formula based on SCr of 7.52 mg/dL (H)).  Assessment: 54 y.o. male with h/o Afib, Coumadin on hold, on heparin and plans for cath today -heparin level at goal -hg= 10.8  Goal of Therapy:  INR 2-3 Monitor platelets by  anticoagulation protocol: Yes   Plan:  -Continue heparin at 2250 units/hr -Daily heparin level and CBC  Hildred Laser, PharmD Clinical Pharmacist **Pharmacist phone directory can now be found on [email or web address] ([email or web address]).  Listed under [place].

## 2021-02-18 ENCOUNTER — Encounter (HOSPITAL_COMMUNITY): Payer: Self-pay | Admitting: Cardiovascular Disease

## 2021-02-18 ENCOUNTER — Other Ambulatory Visit (HOSPITAL_COMMUNITY): Payer: Self-pay

## 2021-02-18 DIAGNOSIS — I4819 Other persistent atrial fibrillation: Secondary | ICD-10-CM

## 2021-02-18 DIAGNOSIS — I5020 Unspecified systolic (congestive) heart failure: Secondary | ICD-10-CM | POA: Diagnosis not present

## 2021-02-18 DIAGNOSIS — I255 Ischemic cardiomyopathy: Secondary | ICD-10-CM

## 2021-02-18 DIAGNOSIS — N186 End stage renal disease: Secondary | ICD-10-CM | POA: Diagnosis not present

## 2021-02-18 DIAGNOSIS — D631 Anemia in chronic kidney disease: Secondary | ICD-10-CM

## 2021-02-18 DIAGNOSIS — E875 Hyperkalemia: Secondary | ICD-10-CM

## 2021-02-18 LAB — CBC
HCT: 39.5 % (ref 39.0–52.0)
Hemoglobin: 11.5 g/dL — ABNORMAL LOW (ref 13.0–17.0)
MCH: 29 pg (ref 26.0–34.0)
MCHC: 29.1 g/dL — ABNORMAL LOW (ref 30.0–36.0)
MCV: 99.7 fL (ref 80.0–100.0)
Platelets: 243 10*3/uL (ref 150–400)
RBC: 3.96 MIL/uL — ABNORMAL LOW (ref 4.22–5.81)
RDW: 21.4 % — ABNORMAL HIGH (ref 11.5–15.5)
WBC: 7 10*3/uL (ref 4.0–10.5)
nRBC: 0 % (ref 0.0–0.2)

## 2021-02-18 LAB — GLUCOSE, CAPILLARY
Glucose-Capillary: 145 mg/dL — ABNORMAL HIGH (ref 70–99)
Glucose-Capillary: 194 mg/dL — ABNORMAL HIGH (ref 70–99)
Glucose-Capillary: 291 mg/dL — ABNORMAL HIGH (ref 70–99)
Glucose-Capillary: 320 mg/dL — ABNORMAL HIGH (ref 70–99)

## 2021-02-18 LAB — BASIC METABOLIC PANEL
Anion gap: 15 (ref 5–15)
BUN: 48 mg/dL — ABNORMAL HIGH (ref 6–20)
CO2: 21 mmol/L — ABNORMAL LOW (ref 22–32)
Calcium: 8.6 mg/dL — ABNORMAL LOW (ref 8.9–10.3)
Chloride: 99 mmol/L (ref 98–111)
Creatinine, Ser: 8.81 mg/dL — ABNORMAL HIGH (ref 0.61–1.24)
GFR, Estimated: 6 mL/min — ABNORMAL LOW (ref >60)
Glucose, Bld: 171 mg/dL — ABNORMAL HIGH (ref 70–99)
Potassium: 5.5 mmol/L — ABNORMAL HIGH (ref 3.5–5.1)
Sodium: 135 mmol/L (ref 135–145)

## 2021-02-18 LAB — PROTIME-INR
INR: 1.7 — ABNORMAL HIGH (ref 0.8–1.2)
Prothrombin Time: 20.3 seconds — ABNORMAL HIGH (ref 11.4–15.2)

## 2021-02-18 MED ORDER — ALBUMIN HUMAN 25 % IV SOLN
INTRAVENOUS | Status: AC
  Administered 2021-02-18: 25 g via INTRAVENOUS
  Filled 2021-02-18: qty 100

## 2021-02-18 MED ORDER — ASPIRIN 81 MG PO TBEC
81.0000 mg | DELAYED_RELEASE_TABLET | Freq: Every day | ORAL | 0 refills | Status: AC
  Filled 2021-02-18: qty 14, 14d supply, fill #0

## 2021-02-18 MED ORDER — SENNOSIDES-DOCUSATE SODIUM 8.6-50 MG PO TABS: 2.0000 | ORAL_TABLET | Freq: Two times a day (BID) | ORAL | Status: DC

## 2021-02-18 MED ORDER — HYDROCORTISONE (PERIANAL) 2.5 % EX CREA
TOPICAL_CREAM | Freq: Four times a day (QID) | CUTANEOUS | 0 refills | Status: DC
Start: 2021-02-18 — End: 2021-06-11
  Filled 2021-02-18: qty 30, 5d supply, fill #0

## 2021-02-18 MED ORDER — TICAGRELOR 90 MG PO TABS
90.0000 mg | ORAL_TABLET | Freq: Two times a day (BID) | ORAL | 3 refills | Status: DC
  Filled 2021-02-18: qty 60, 30d supply, fill #0

## 2021-02-18 MED ORDER — WARFARIN SODIUM 5 MG PO TABS: 5.0000 mg | ORAL_TABLET | Freq: Once | ORAL | Status: DC

## 2021-02-18 MED ORDER — HEPARIN SODIUM (PORCINE) 1000 UNIT/ML IJ SOLN
INTRAMUSCULAR | Status: AC
  Administered 2021-02-18: 3800 [IU] via INTRAVENOUS_CENTRAL
  Filled 2021-02-18: qty 4

## 2021-02-18 MED ORDER — ALBUMIN HUMAN 25 % IV SOLN: 25.0000 g | Freq: Once | INTRAVENOUS | Status: AC

## 2021-02-18 MED ORDER — WITCH HAZEL-GLYCERIN EX PADS
MEDICATED_PAD | CUTANEOUS | 12 refills | Status: DC | PRN
  Filled 2021-02-18: qty 40, 7d supply, fill #0

## 2021-02-18 MED ORDER — INSULIN ASPART 100 UNIT/ML IJ SOLN
0.0000 [IU] | Freq: Three times a day (TID) | INTRAMUSCULAR | Status: DC
  Administered 2021-02-18: 3 [IU] via SUBCUTANEOUS

## 2021-02-18 MED ORDER — CLONAZEPAM 0.5 MG PO TABS
ORAL_TABLET | ORAL | Status: AC
  Administered 2021-02-18: 0.5 mg via ORAL
  Filled 2021-02-18: qty 1

## 2021-02-18 MED ORDER — ALBUMIN HUMAN 25 % IV SOLN: 25.0000 g | Freq: Once | INTRAVENOUS | Status: DC

## 2021-02-18 MED ORDER — INSULIN GLARGINE 100 UNIT/ML ~~LOC~~ SOLN
40.0000 [IU] | Freq: Two times a day (BID) | SUBCUTANEOUS | Status: DC
  Administered 2021-02-18: 40 [IU] via SUBCUTANEOUS
  Filled 2021-02-18 (×2): qty 0.4

## 2021-02-18 MED ORDER — METOPROLOL SUCCINATE ER 25 MG PO TB24
12.5000 mg | ORAL_TABLET | Freq: Every day | ORAL | 0 refills | Status: DC
  Filled 2021-02-18: qty 30, 60d supply, fill #0

## 2021-02-18 MED ORDER — METOPROLOL SUCCINATE ER 25 MG PO TB24
12.5000 mg | ORAL_TABLET | Freq: Every day | ORAL | Status: DC
  Filled 2021-02-18: qty 1

## 2021-02-18 NOTE — Progress Notes (Signed)
Site area: right groin  Site Prior to Removal:  Level 0  Pressure Applied For 20 MINUTES    Minutes Beginning at 2352  Manual:   Yes.    Patient Status During Pull:  Pt. is alert,oriented x4  Post Pull Groin Site:  Level 0  Post Pull Instructions Given:  Yes.    Post Pull Pulses Present:  Yes.    Dressing Applied:  Yes.    Comments:  Site area: right groin  Site Prior to Removal:  Level 0  Pressure Applied For 20 MINUTES    Minutes Beginning at 2352  Manual:   Yes.    Patient Status During Pull:  Pt.is AAO X4  Post Pull Groin Site:  Level 0  Post Pull Instructions Given:  Yes.    Post Pull Pulses Present:  Yes.    Dressing Applied:  Yes.    Comments:  Pt.tolerated procedure well

## 2021-02-18 NOTE — Progress Notes (Addendum)
[facility] Progress Note   Subjective:   LHC yesterday --PCI/DES x1  Seen in HD unit. Tolerating UF. No cp, sob.   Objective Vitals:   02/18/21 0755 02/18/21 0923 02/18/21 0931 02/18/21 1000  BP: 106/65 (!) 88/43 (!) 83/37 115/76  Pulse: 81 90 (!) 105 (!) 108  Resp: 16 13 (!) 23 (!) 21  Temp: 98.4 F (36.9 C) 97.7 F (36.5 C)    TempSrc: Oral Oral    SpO2: 98% 93%    Weight:  (!) 137 kg    Height:       Physical Exam General: Well appearing man, NAD. Nasal O2 in place. Heart: RRR; no murmur Lungs: CTAB Abdomen: soft, non-tender Extremities: No LE edema Dialysis Access: new [facility] R chest  Additional Objective Labs: Basic Metabolic Panel: Recent Labs  Lab 02/15/21 0927 02/16/21 0734 02/17/21 0627 02/18/21 0204  NA 136 134* 138 135  K 5.6* 5.2* 4.3 5.5*  CL 99 100 102 99  CO2 28 22 24  21*  GLUCOSE 157* 142* 51* 171*  BUN 26* 25* 38* 48*  CREATININE 6.51* 5.46* 7.52* 8.81*  CALCIUM 8.5* 8.0* 8.5* 8.6*  PHOS 5.2*  --   --   --    CBC: Recent Labs  Lab 02/14/21 0328 02/14/21 1356 02/15/21 0927 02/16/21 0608 02/17/21 0627 02/18/21 0204  WBC 7.4  --  6.7 7.1 7.4 7.0  HGB 11.2*   < > 11.1* 11.1* 10.8* 11.5*  HCT 38.9*   < > 38.9* 39.0 37.3* 39.5  MCV 101.8*  --  103.2* 104.0* 101.9* 99.7  PLT 219  --  207 213 236 243   < > = values in this interval not displayed.   CBG: Recent Labs  Lab 02/17/21 2045 02/17/21 2337 02/18/21 0035 02/18/21 0717 02/18/21 0754  GLUCAP 107* 130* 145* 291* 320*   Studies/Results: CARDIAC CATHETERIZATION  Result Date: 02/17/2021  1st Mrg lesion is 100% stenosed.  Previously placed Prox LAD to Mid LAD stent (unknown type) is widely patent.  Mid LAD lesion is 100% stenosed.  Ramus lesion is 100% stenosed.  A drug-eluting stent was successfully placed using a STENT RESOLUTE ONYX 3.5X18.  Post intervention, there is a 0% residual stenosis.  Post intervention, there is a 0% residual stenosis.  1. Patent mid  LAD stent. Total occlusion of the mid LAD stent just off of the distal edge of the old stent. 2. Total occlusion of the intermediate branch 3. Total occlusion small obtuse marginal branch 4. Patent RCA with minimal plaque. This vessel supplies collaterals to the intermediate branch and the obtuse marginal branch. 5. Successful PTCA/DES x 1 mid LAD Recommendations: Will continue DAPT with ASA and Brilinta for one month then stop ASA. He has been on Plavix at home but this was held recently for unknown reason. Given the possible thrombotic nature of this lesion in the LAD, I think Brilinta should be used instead of Plavix for the next 12 months if possible. Resume coumadin.   Medications: . sodium chloride    . albumin human    . albumin human    . albumin human     . aspirin EC  81 mg Oral Daily  . atorvastatin  80 mg Oral Daily  . Chlorhexidine Gluconate Cloth  6 each Topical Daily  . cinacalcet  30 mg Oral Q supper  . clonazePAM  0.5 mg Oral BID  . ferric citrate  420 mg Oral TID WC  . hydrocortisone   Rectal  QID  . insulin aspart  0-15 Units Subcutaneous TID WC  . insulin aspart  0-5 Units Subcutaneous QHS  . insulin glargine  40 Units Subcutaneous BID  . midodrine  10 mg Oral TID with meals  . senna-docusate  2 tablet Oral BID  . sevelamer carbonate  1,600 mg Oral TID WC  . sodium chloride flush  3 mL Intravenous Q12H  . ticagrelor  90 mg Oral BID  . warfarin  5 mg Oral ONCE-1600  . Warfarin - Pharmacist Dosing Inpatient   Does not apply q1600    Dialysis Orders: [facility] at [facility] - last 5/3 4:30hr, 250dialyzer, 400/A1.5, EDW 131kg, 2K/2Ca, TDC, no heparin - Finishing course of IV iron, Hgb 11.7 - Calcitriol 1.35mcg PO q HD - On mido, Veltassa, Renvela/Auryxia as binders, PO sensipar 30 qhs.  Assessment/Plan: 1. Malfunctioning TDC catheter: S/p exchange 5/6, per [facility]. 2. Elevated troponin/CAD:Echo with lower EF 25-30%. LHC 5/9 --PCI/DES x1 to mid LAD -per cardiology 3. ESRD:Usual TTS  schedule. Got short, ineffective HD 5/6. Ran again on 5/7 with net UF 1.7.  Does take Veltassa at home daily and hasn't been getting here. K+ 5.5. HD today  4. Hypotension/volume:Chronic hypotension (on mido), continue same EDW with HD. Ischemic CM eval as above. 5. Anemia:Hgb 11.5 No ESA needs currently.  6. Metabolic bone disease:Ca/Phos ok. Continue home binders/sensipar. 7. T2DM: Insulin per primary  8. A-Fib:On warfarin - resumed 5/9 -per primary   Lynnda Child PA-C [place] 02/18/2021,10:31 AM   Seen and examined independently this AM prior to HD.  Agree with note and exam as documented above by physician extender and as noted here.  General adult male in bed in no acute distress; conversant. 88/43 prior to HD start HEENT normocephalic atraumatic extraocular movements intact sclera anicteric Neck supple trachea midline Lungs clear to auscultation bilaterally normal work of breathing at rest  Heart S1S2 no rub Abdomen soft nontender nondistended Extremities nonpitting edema  Psych normal mood and affect Neuro - alert and oriented x 3 provides hx and follows commands Access: RIJ tunneled catheter in place   CAD - s/p cath with stent placement on 5/9. Appreciate cardiology. Anticoagulation per cardiology  Malfunctioning HD catheter - exchanged  ESRD - HD per TTS schedule.  Would clarify if actually taking veltassa and would restart if yes  Chronic hypotension on midodrine - I ordered albumin 25 gram IV x 2 doses today to help augment UF.  I am concerned about his tolerance of a beta blocker given hypotension  afib - cardiology started beta blocker   Claudia Desanctis, MD 02/18/2021 12:49 PM

## 2021-02-18 NOTE — Progress Notes (Addendum)
Physical Therapy Treatment Patient Details Name: [name] [name] MRN: [id number] DOB: Jul 22, 1966 Today's Date: 02/18/2021    History of Present Illness 55 y.o. male [facility] ED on 02/13/2021 with R chest pain/pressure and SOB. ED work-up revealed elevated troponin, hypotension and tachycardia. Pt had been unable to recevie dialysis on 5/5 due to malfunctioning catheter. Pt underwent dialysis catheter exchange on 5/6. Pt with concern for cadiomyopathy. PMH includes but is not limited to ESRD, DMII, CAD, Afib, peripheral neuropathy, DVT, endocarditis, gout, MI, OSA.    PT Comments    Pt received seated EOB, agreeable to therapy session and with fair participation and tolerance in stair training. Pt quick to fatigue and able to perform 5 standard height steps (1 step x5 reps) prior to needing seated rest break. Pt will need to perform 13 steps to enter his townhome so reviewed activity pacing strategies with pt and encouraged him to obtain assistance for entry/egress (although per eval pt may be able to enter via ramp and has electric scooter- need clarification on this. Pt too fatigued to perform gait trial >5ft, he would benefit from more therapy prior to discharge to home and remains an increased fall risk due to deconditioning/decreased safety with mobility. Continue to recommend HHPT and DME as listed below as he continues to need minA for safety with functional mobility tasks.    Follow Up Recommendations  Home health PT;Supervision for mobility/OOB (supervision for stairs)     Equipment Recommendations  Rolling walker with 5" wheels    Recommendations for Other Services       Precautions / Restrictions Precautions Precautions: Fall;Other (comment) (tachycardia) Restrictions Weight Bearing Restrictions: No    Mobility  Bed Mobility               General bed mobility comments: pt received/remained EOB    Transfers Overall transfer level: Needs assistance Equipment  used: Rolling walker (2 wheeled) Transfers: Sit to/from Stand Sit to Stand: Min assist;Min guard         General transfer comment: Pt requires min A with sit>stand transfer from EOB initially then min guard second attempt  Ambulation/Gait Ambulation/Gait assistance: Min guard Gait Distance (Feet): 10 Feet Assistive device: Rolling walker (2 wheeled) Gait Pattern/deviations: Step-through pattern;Wide base of support;Trunk flexed Gait velocity: decreased Gait velocity interpretation: <1.8 ft/sec, indicate of risk for recurrent falls General Gait Details: Gait at bedside to/from step in room for stair training. Pt flexed forward posture and increased work of breathing, SpO2 89% on RA with exertion and HR elevated to 125 bpm with exertion, needed seated break to recover,   Stairs Stairs: Yes Stairs assistance: Min assist Stair Management: Forwards;Step to pattern;With walker (RW to simulate rails) Number of Stairs: 5 General stair comments: cues for activity pacing and sequencing for safety, pt able to perform x5 steps (has 13 STE) and reports needing a rest break. Encouraged pt to perform more but he reports it will not be an issue for him. See gait comments for VS. Pt encouraged to use gait belt and put chair at bottom and top of steps for rest breaks and to make sure someone is present to assist him up the stairs and into his home.   Wheelchair Mobility    Modified Rankin (Stroke Patients Only)       Balance Overall balance assessment: Needs assistance Sitting-balance support: Bilateral upper extremity supported;Feet supported Sitting balance-Leahy Scale: Fair Sitting balance - Comments: Pt needs assist to don pants, able to lift BLE up  but difficulty reaching down to hook leg into pants 2/2 body habitus   Standing balance support: Bilateral upper extremity supported;Single extremity supported;During functional activity Standing balance-Leahy Scale: Poor Standing balance  comment: Pt requires at least single UE support for stability when standing, able to stand and pull up pants one side at a time with min guard; heavy anterior lean on RW and fairly unsteady            Cognition Arousal/Alertness: Awake/alert Behavior During Therapy: WFL for tasks assessed/performed Overall Cognitive Status: Within Functional Limits for tasks assessed         General Comments: quick to fatigue but states "I'll be able to do the stairs (13), I have to". Pt with some decreased insight into condition.      Exercises Other Exercises Other Exercises: pt given supine strengthening "General Strengthening Exercises" handout, encouraged him to perform 2-3x/day.    General Comments General comments (skin integrity, edema, etc.): see gait comments for O2 sats - pt desat to 89% on RA with exertion, charge RN notified as his bedside RN was busy with another pt. Pt requesting rollator for discharge but currently recommend RW (2WW) as he remains too unsteady to use rollator safely.      Pertinent Vitals/Pain      Home Living                      Prior Function            PT Goals (current goals can now be found in the care plan section) Acute Rehab PT Goals Patient Stated Goal: Return home and be more independent in his mobility. PT Goal Formulation: With patient Time For Goal Achievement: 03/02/21 Potential to Achieve Goals: Fair Progress towards PT goals: Progressing toward goals    Frequency    Min 3X/week      PT Plan Current plan remains appropriate       AM-PAC PT "6 Clicks" Mobility   Outcome Measure  Help needed turning from your back to your side while in a flat bed without using bedrails?: None Help needed moving from lying on your back to sitting on the side of a flat bed without using bedrails?: None Help needed moving to and from a bed to a chair (including a wheelchair)?: A Little Help needed standing up from a chair using your arms  (e.g., wheelchair or bedside chair)?: A Little Help needed to walk in hospital room?: A Little Help needed climbing 3-5 steps with a railing? : A Little 6 Click Score: 20    End of Session Equipment Utilized During Treatment: Gait belt Activity Tolerance: Patient limited by fatigue Patient left: with call bell/phone within reach;in bed;with bed alarm set (seated EOB) Nurse Communication: Mobility status;Other (comment) (DME recs remain RW (chg RN notified) & desat with exertion.) PT Visit Diagnosis: Other abnormalities of gait and mobility (R26.89);Muscle weakness (generalized) (M62.81);Difficulty in walking, not elsewhere classified (R26.2)     Time: 2831-5176 PT Time Calculation (min) (ACUTE ONLY): 10 min  Charges:  $Gait Training: 8-22 mins                     Lavena Loretto P., PTA Acute Rehabilitation Services Pager: [phone] Office: [place] 02/18/2021, 5:16 PM

## 2021-02-18 NOTE — Progress Notes (Addendum)
Progress Note  Patient Name: [name] [name] Date of Encounter: 02/18/2021  [facility] Cardiologist: Lauree Chandler, MD   Subjective   No chest pain, sitting up in bed. Discussed the need to switch antiplatelet medications in detail.   Inpatient Medications    Scheduled Meds: . aspirin EC  81 mg Oral Daily  . atorvastatin  80 mg Oral Daily  . Chlorhexidine Gluconate Cloth  6 each Topical Daily  . cinacalcet  30 mg Oral Q supper  . clonazePAM  0.5 mg Oral BID  . ferric citrate  420 mg Oral TID WC  . hydrocortisone   Rectal QID  . insulin aspart  0-5 Units Subcutaneous QHS  . insulin aspart  0-6 Units Subcutaneous TID WC  . insulin glargine  20 Units Subcutaneous BID  . midodrine  10 mg Oral TID with meals  . senna-docusate  2 tablet Oral BID  . sevelamer carbonate  1,600 mg Oral TID WC  . sodium chloride flush  3 mL Intravenous Q12H  . ticagrelor  90 mg Oral BID  . Warfarin - Pharmacist Dosing Inpatient   Does not apply q1600   Continuous Infusions: . sodium chloride     PRN Meds: sodium chloride, acetaminophen, bisacodyl, bisacodyl, fentaNYL (SUBLIMAZE) injection, loperamide, LORazepam, melatonin, nitroGLYCERIN, prochlorperazine, sodium chloride flush, witch hazel-glycerin   Vital Signs    Vitals:   02/17/21 2250 02/18/21 0007 02/18/21 0521 02/18/21 0755  BP:  91/66 101/61 106/65  Pulse: (P) 99 97 61 81  Resp: (P) 17  18 16   Temp:  (!) 97.3 F (36.3 C) 97.8 F (36.6 C) 98.4 F (36.9 C)  TempSrc:  Axillary Oral Oral  SpO2:  98% 92% 98%  Weight:   (!) 136.2 kg   Height:        Intake/Output Summary (Last 24 hours) at 02/18/2021 0805 Last data filed at 02/18/2021 0344 Gross per 24 hour  Intake 1084.94 ml  Output 0 ml  Net 1084.94 ml   Last 3 Weights 02/18/2021 02/17/2021 02/15/2021  Weight (lbs) 300 lb 4.8 oz 300 lb 11.3 oz 303 lb 12.7 oz  Weight (kg) 136.215 kg 136.4 kg 137.8 kg  Some encounter information is confidential and restricted. Go to Review  Flowsheets activity to see all data.      Telemetry    Atrial flutter, rates 110s - Personally Reviewed  ECG    Suspect Atrial flutter with RBBB - Personally Reviewed  Physical Exam  Pleasant male, sitting up in bed.  GEN: No acute distress.   Neck: No JVD Cardiac: Tachy, no murmurs, rubs, or gallops.  Respiratory: Clear to auscultation bilaterally. GI: Soft, nontender, non-distended  MS: No edema; No deformity. Right groin site stable.  Neuro:  Nonfocal  Psych: Normal affect   Labs    High Sensitivity Troponin:   Recent Labs  Lab 02/14/21 0024 02/14/21 0328 02/14/21 0551 02/15/21 1634 02/16/21 0731  TROPONINIHS 576* 922* 1,335* 950* 656*      Chemistry Recent Labs  Lab 02/16/21 0734 02/17/21 0627 02/18/21 0204  NA 134* 138 135  K 5.2* 4.3 5.5*  CL 100 102 99  CO2 22 24 21*  GLUCOSE 142* 51* 171*  BUN 25* 38* 48*  CREATININE 5.46* 7.52* 8.81*  CALCIUM 8.0* 8.5* 8.6*  GFRNONAA 11* 8* 6*  ANIONGAP 12 12 15      Hematology Recent Labs  Lab 02/16/21 0608 02/17/21 0627 02/18/21 0204  WBC 7.1 7.4 7.0  RBC 3.75* 3.66* 3.96*  HGB 11.1*  10.8* 11.5*  HCT 39.0 37.3* 39.5  MCV 104.0* 101.9* 99.7  MCH 29.6 29.5 29.0  MCHC 28.5* 29.0* 29.1*  RDW 21.6* 21.2* 21.4*  PLT 213 236 243    BNPNo results for input(s): BNP, PROBNP in the last 168 hours.   DDimer No results for input(s): DDIMER in the last 168 hours.   Radiology    CARDIAC CATHETERIZATION  Result Date: 02/17/2021  1st Mrg lesion is 100% stenosed.  Previously placed Prox LAD to Mid LAD stent (unknown type) is widely patent.  Mid LAD lesion is 100% stenosed.  Ramus lesion is 100% stenosed.  A drug-eluting stent was successfully placed using a STENT RESOLUTE ONYX 3.5X18.  Post intervention, there is a 0% residual stenosis.  Post intervention, there is a 0% residual stenosis.  1. Patent mid LAD stent. Total occlusion of the mid LAD stent just off of the distal edge of the old stent. 2. Total  occlusion of the intermediate branch 3. Total occlusion small obtuse marginal branch 4. Patent RCA with minimal plaque. This vessel supplies collaterals to the intermediate branch and the obtuse marginal branch. 5. Successful PTCA/DES x 1 mid LAD Recommendations: Will continue DAPT with ASA and Brilinta for one month then stop ASA. He has been on Plavix at home but this was held recently for unknown reason. Given the possible thrombotic nature of this lesion in the LAD, I think Brilinta should be used instead of Plavix for the next 12 months if possible. Resume coumadin.   DG CHEST PORT 1 VIEW  Result Date: 02/16/2021 CLINICAL DATA:  Short of breath, chest pains EXAM: PORTABLE CHEST 1 VIEW COMPARISON:  Prior chest x-ray 02/15/2021 FINDINGS: Well-positioned right IJ tunneled hemodialysis catheter. Catheter tip overlies the mid right atrium. Stable cardiomegaly and mediastinal contours. Atherosclerotic calcifications again noted in the transverse aorta. Similar appearance of pulmonary vascular congestion bordering on mild edema. No evidence of pneumothorax. No large pleural effusion. No acute osseous abnormality. Metallic stents present in the right upper extremity axillary vein. IMPRESSION: 1. Cardiomegaly and pulmonary vascular congestion bordering on mild edema appears similar compared to 02/14/2021. 2. Stable and except will position of right IJ tunneled hemodialysis catheter. 3. No evidence of pneumothorax. Electronically Signed   By: Jacqulynn Cadet M.D.   On: 02/16/2021 10:04    Cardiac Studies   Cath: 02/17/21   1st Mrg lesion is 100% stenosed.  Previously placed Prox LAD to Mid LAD stent (unknown type) is widely patent.  Mid LAD lesion is 100% stenosed.  Ramus lesion is 100% stenosed.  A drug-eluting stent was successfully placed using a STENT RESOLUTE ONYX 3.5X18.  Post intervention, there is a 0% residual stenosis.  Post intervention, there is a 0% residual stenosis.   1. Patent mid  LAD stent. Total occlusion of the mid LAD stent just off of the distal edge of the old stent.  2. Total occlusion of the intermediate branch 3. Total occlusion small obtuse marginal branch 4. Patent RCA with minimal plaque. This vessel supplies collaterals to the intermediate branch and the obtuse marginal branch.  5. Successful PTCA/DES x 1 mid LAD  Recommendations: Will continue DAPT with ASA and Brilinta for one month then stop ASA. He has been on Plavix at home but this was held recently for unknown reason. Given the possible thrombotic nature of this lesion in the LAD, I think Brilinta should be used instead of Plavix for the next 12 months if possible. Resume coumadin.   Diagnostic Dominance: Right  Intervention     Echo: 02/15/21  1. Left ventricular ejection fraction, by estimation, is 25 to 30%. The  left ventricle has severely decreased function. The left ventricle  demonstrates regional wall motion abnormalities. There is global  hypokinesis with disproportionately severe  hypokinesis of the left ventricular, mid-apical anteroseptal wall,  anterior wall, apical segment and inferior wall. There is moderate  concentric left ventricular hypertrophy. Left ventricular diastolic  function could not be evaluated.  2. Right ventricular systolic function is moderately reduced. The right  ventricular size is moderately enlarged. There is moderately elevated  pulmonary artery systolic pressure.  3. Left atrial size was severely dilated.  4. A small pericardial effusion is present. The pericardial effusion is  circumferential. There is no evidence of cardiac tamponade.  5. The mitral valve is degenerative. Mild mitral valve regurgitation.  Mild mitral stenosis. The mean mitral valve gradient is 4.8 mmHg. Severe  mitral annular calcification.  6. Tricuspid valve regurgitation is moderate.  7. The aortic valve is tricuspid. There is mild calcification of the  aortic valve.  There is mild thickening of the aortic valve. Aortic valve  regurgitation is mild. Mild aortic valve sclerosis is present, with no  evidence of aortic valve stenosis.  8. The inferior vena cava is dilated in size with <50% respiratory  variability, suggesting right atrial pressure of 15 mmHg.    Patient Profile     55 y.o. male with PMH of ESRD on HD, CAD s/p PCI/DES of the LAD 07/2020, HTN, HLD, Atrial fib/flutter, DM, DVT, HFrEF (prior EF of 40-45%), COPD who presented difficulties with his HD cath but found to have elevated troponin and further reduced EF.   Assessment & Plan    1. Acute on chronic combined CHF: EF notably reduced further this admission at 25-30% (prior 40-45%) with elevated troponin. Underwent cardiac cath yesterday noted above with patent prior mLAD stent, but total occlusion at distal edge of stent. PCI/DES x1 to mLAD below prior stenting. Given concern for thrombotic event, he was transitioned to [place]. Of note, plavix was not continued on admission for unclear reason.  -- on DAPT with ASA/Brilinta, statin, unable to add BB with soft bps requiring midodrine. No ACE/ARB with ESRD -- volume management with HD  2. CAD s/p LAD 07/2020: had been on plavix PTA, but does not appear this was continued on admission. Notes do not given indication for possible hold? Did have VVS intervention with new tunnel dialysis catheter.  -- as above with PCI/DES x1, plan now for DAPT with ASA/Brilnta.   3. Atrial fib/flutter: coumadin held with plans for cath, now transitioned back to coumadin. INR 1.7 -- rates are elevated on telemetry. Would ideally add low dose BB, but BPs have been soft and he is on scheduled midodrine. Question whether could increase midodrine in attempts to add low dose BB for better rate control as well as CHF.   4. ESRD on HD: per nephrology  5. Hypotension: struggles with low Bps. On Midodrine, therefore limited in options for medical therapy with CHF.    6. HLD: on Lipitor 80mg  daily -- LDL 31, HDL 23  7. DM: on SSI  8. Hyperkalemia: K+ 5.5 this morning, for HD today -- per nephrology  For questions or updates, please contact [place] Please consult [email or web address] for contact info under        Signed, Reino Bellis, NP  02/18/2021, 8:05 AM    Patient seen and examined. Agree with assessment and plan.  Patient is currently undergoing dialysis.  Telemetry reveals sinus tachycardia at 107 bpm.  Potassium this morning was elevated at 5.5.  Catheterization data reviewed.  Patient had total occlusion of the distal edge of his LAD stent which was successfully intervened upon with assertion of a Resolute Onyx 3.5 x 18 mm stent.  With the LAD being open, hopefully the patient will not have issues with low blood pressure for which she has required previous midodrine use.  He would benefit from initiation of very low-dose beta-blocker therapy if blood pressure will allow.  We will try adding metoprolol succinate 12.5 mg at bedtime.  Since the patient had developed occlusion on Plavix/warfarin, it was advised that he switch to [place].  Due to potential bleed risk rather than 1 month of aspirin with triple drug therapy, consider discontinuing aspirin in 2 weeks but continue Brilinta/warfarin beyond that.   Troy Sine, MD, [place] 02/18/2021 10:45 AM

## 2021-02-18 NOTE — TOC Benefit Eligibility Note (Signed)
Transition of Care [facility]) Benefit Eligibility Note    Patient Details  Name: [name] [name] MRN: [id number] Date of Birth: [birth year]   Medication/Dose: Kary Kos 90mg  bid for 30day supply  Covered?: Yes  Tier: 3 Drug  Prescription Coverage [facility]erred [facility]  Spoke with Person/Company/Phone Number:: [place] [phone]  Co-Pay: ZERO  Prior Approval: No  Deductible:  (?)       Shelda Altes Phone Number: 02/18/2021, 12:34 PM

## 2021-02-18 NOTE — Progress Notes (Signed)
CARDIAC REHAB PHASE I   Pt recently returned from HD, lunch at bedside. Stent education completed. Pt educated on importance of ASA, Brilinta, statin, and Coumadin. Reinforced signs and symptoms of bleeding. Reviewed site care, restrictions, and exercise guidelines. Will refer to CRP II [place]. Pt denies further questions or concerns at this time, insistent he d/c today.  [id number] Rufina Falco, RN BSN 02/18/2021 2:14 PM

## 2021-02-18 NOTE — Plan of Care (Signed)

## 2021-02-18 NOTE — Progress Notes (Signed)
CARDIAC REHAB PHASE I   Went to completed stent education with pt. Pt out of room for HD. Will f/u as pts schedule allows.  Rufina Falco, RN BSN 02/18/2021 9:31 AM

## 2021-02-18 NOTE — TOC Benefit Eligibility Note (Signed)
Patient Teacher, English as a foreign language completed.    The patient is currently admitted and upon discharge could be taking Brilinta 90 mg.  The current 30 day co-pay is, $0.00.   The patient is insured through [place], [place] Patient Advocate Specialist [place] Team Direct Number: [phone]  Fax: [phone]

## 2021-02-18 NOTE — Progress Notes (Signed)
Pt signed off the machine with 1hr of tx left; pt states that he is anxious; Klonopin 0.5mg  PO given but pt insists he wants to come of the machine.

## 2021-02-18 NOTE — Progress Notes (Signed)
[place] for warfarin Indication: atrial fibrillation  Allergies  Allergen Reactions  . Shellfish-Derived Products Anaphylaxis  . Ace Inhibitors Cough  . Hydrocodone Other (See Comments)    Hallucinations  . Pork-Derived Products Other (See Comments)    Entry determined to be clinically insignificant: OK TO GIVE HEPARIN (has tolerated well in the past)  Patient had allergic episode in 1989 after he ate pork at a fair. He developed fever and rash and went to the ED, but he did not have breathing issues. Received heparin in the past. Patient ok with removing pork allergy and listing as intolerance instead.    Patient Measurements: Height: 5\' 10"  (177.8 cm) Weight: (!) 136.2 kg (300 lb 4.8 oz) IBW/kg (Calculated) : 73 kg  Vital Signs: Temp: 98.4 F (36.9 C) (05/10 0755) Temp Source: Oral (05/10 0755) BP: 106/65 (05/10 0755) Pulse Rate: 81 (05/10 0755)  Labs: Recent Labs    02/15/21 0927 02/15/21 1634 02/16/21 7858 02/16/21 0731 02/16/21 0734 02/16/21 1400 02/16/21 2231 02/17/21 0627 02/17/21 0729 02/18/21 0204  HGB  --   --  11.1*  --   --   --   --  10.8*  --  11.5*  HCT  --   --  39.0  --   --   --   --  37.3*  --  39.5  PLT  --   --  213  --   --   --   --  236  --  243  LABPROT  --   --  19.8*  --   --   --   --  17.8*  --  20.3*  INR  --   --  1.7*  --   --   --   --  1.5*  --  1.7*  HEPARINUNFRC   < >  --  <0.10*  --   --  <0.10* 0.17*  --  0.35  --   CREATININE  --   --   --   --  5.46*  --   --  7.52*  --  8.81*  TROPONINIHS  --  950*  --  656*  --   --   --   --   --   --    < > = values in this interval not displayed.    Estimated Creatinine Clearance: 12.9 mL/min (A) (by C-G formula based on SCr of 8.81 mg/dL (H)).  Assessment: 55 y.o. male with h/o Afib s/p cath with DES to LAD and coumadin restarted 5/9 -INR= 1.7  Home coumadin dose: 5mg /d  Goal of Therapy:  INR 2-3 Monitor platelets by anticoagulation  protocol: Yes   Plan:  -Coumadin 5mg  po today -Daily PT/INR  Hildred Laser, PharmD Clinical Pharmacist **Pharmacist phone directory can now be found on [email or web address] ([email or web address]).  Listed under [place].

## 2021-02-19 ENCOUNTER — Telehealth (HOSPITAL_COMMUNITY): Payer: Self-pay

## 2021-02-19 ENCOUNTER — Telehealth: Payer: Self-pay

## 2021-02-19 NOTE — Telephone Encounter (Signed)
Transition Care Management Follow-up Telephone Call  Date of discharge and from where: 02/18/2021 from [facility]  How have you been since you were released from the hospital? Feeling very weak  Any questions or concerns? No  Items Reviewed:  Did the pt receive and understand the discharge instructions provided? Yes   Medications obtained and verified? Yes   Other? No   Any new allergies since your discharge? No   Dietary orders reviewed? Yes, low sodium heart healthy diet  Do you have support at home? Yes   Home Care and Equipment/Supplies: Were home health services ordered? no If so, what is the name of the agency? n/a  Has the agency set up a time to come to the patient's home? not applicable Were any new equipment or medical supplies ordered?  No What is the name of the medical supply agency? n/a Were you able to get the supplies/equipment? not applicable Do you have any questions related to the use of the equipment or supplies? No  Functional Questionnaire: (I = Independent and D = Dependent) ADLs: D  Bathing/Dressing- D  Meal Prep- D  Eating- I  Maintaining continence- I  Transferring/Ambulation- I  Managing Meds- I  Follow up appointments reviewed:   PCP Hospital f/u appt confirmed? Yes  Scheduled to see [name], MD on 02/26/2021 @ 11:00 am.  [facility] f/u appt confirmed? Yes  Scheduled to see Lauree Chandler, MD on 03/17/2021 @ 3:40 pm.  Are transportation arrangements needed? No   If their condition worsens, is the pt aware to call PCP or go to the Emergency Dept.? Yes  Was the patient provided with contact information for the PCP's office or ED? yes  Was to pt encouraged to call back with questions or concerns? Yes

## 2021-02-19 NOTE — Telephone Encounter (Signed)
Reviewed chart and counseled pt on Ticagrelor adherence

## 2021-02-20 DIAGNOSIS — R079 Chest pain, unspecified: Secondary | ICD-10-CM | POA: Diagnosis not present

## 2021-02-20 DIAGNOSIS — Z992 Dependence on renal dialysis: Secondary | ICD-10-CM | POA: Diagnosis not present

## 2021-02-20 DIAGNOSIS — N2581 Secondary hyperparathyroidism of renal origin: Secondary | ICD-10-CM | POA: Diagnosis not present

## 2021-02-20 DIAGNOSIS — Z452 Encounter for adjustment and management of vascular access device: Secondary | ICD-10-CM | POA: Diagnosis not present

## 2021-02-20 DIAGNOSIS — N186 End stage renal disease: Secondary | ICD-10-CM | POA: Diagnosis not present

## 2021-02-21 LAB — CBC AND DIFFERENTIAL
HCT: 40 — AB (ref 41–53)
Hemoglobin: 11.4 — AB (ref 13.5–17.5)
WBC: 8

## 2021-02-21 LAB — COMPREHENSIVE METABOLIC PANEL: Calcium: 8.3 — AB (ref 8.7–10.7)

## 2021-02-21 LAB — POCT INR: INR: 2.9 — AB (ref 0.9–1.1)

## 2021-02-21 LAB — CBC: RBC: 3.81 — AB (ref 3.87–5.11)

## 2021-02-21 LAB — IRON,TIBC AND FERRITIN PANEL
Ferritin: 57
Iron: 41
TIBC: 278
UIBC: 237

## 2021-02-21 LAB — BASIC METABOLIC PANEL
BUN: 15 (ref 4–21)
Chloride: 105 (ref 99–108)
Glucose: 43
Potassium: 4.7 (ref 3.4–5.3)
Sodium: 143 (ref 137–147)

## 2021-02-22 ENCOUNTER — Other Ambulatory Visit: Payer: Self-pay | Admitting: Endocrinology

## 2021-02-22 DIAGNOSIS — Z992 Dependence on renal dialysis: Secondary | ICD-10-CM | POA: Diagnosis not present

## 2021-02-22 DIAGNOSIS — N186 End stage renal disease: Secondary | ICD-10-CM | POA: Diagnosis not present

## 2021-02-22 DIAGNOSIS — N2581 Secondary hyperparathyroidism of renal origin: Secondary | ICD-10-CM | POA: Diagnosis not present

## 2021-02-24 ENCOUNTER — Ambulatory Visit (INDEPENDENT_AMBULATORY_CARE_PROVIDER_SITE_OTHER): Payer: Medicare HMO | Admitting: Podiatry

## 2021-02-24 ENCOUNTER — Other Ambulatory Visit: Payer: Self-pay

## 2021-02-24 ENCOUNTER — Encounter: Payer: Self-pay | Admitting: Podiatry

## 2021-02-24 DIAGNOSIS — L97512 Non-pressure chronic ulcer of other part of right foot with fat layer exposed: Secondary | ICD-10-CM

## 2021-02-24 DIAGNOSIS — E1149 Type 2 diabetes mellitus with other diabetic neurological complication: Secondary | ICD-10-CM

## 2021-02-24 MED ORDER — SILVER SULFADIAZINE 1 % EX CREA: 1.0000 "application " | TOPICAL_CREAM | Freq: Every day | CUTANEOUS | 0 refills | Status: DC

## 2021-02-24 NOTE — Progress Notes (Signed)
Subjective: 55 year old male presents the office today for evaluation of calluses, wounds on the right fourth and fifth toes.  He states that they are healing although slowly.  He feels that they are starting to dry out more.  He again states that he is not sure how this started but he does not check his feet on a regular basis.  Denies any fevers, chills, nausea, vomiting.  No other concerns today.    Objective: AAO x3, NAD DP/PT pulses palpable bilaterally, CRT less than 3  Large callus areas on the dorsal aspect of the right fourth and fifth digits encompassing entire dorsal toe.  Is starting to drive and is able to debride some of the loose tissue today and the underlying skin intact.  There is no edema, erythema, drainage or pus or any obvious signs of infection.  No open lesions otherwise. No pain with calf compression, swelling, warmth, erythema  Assessment: Preulcerative calluses, wounds right fourth and fifth digits  Plan: -All treatment options discussed with the patient including all alternatives, risks, complications.  -Sharply debrided the loose tissue today with a tissue nipper as well as a 312 with scalpel down to underlying healthy tissue.  Continue with Silvadene dressing changes daily which I reordered today.  Also on water recheck arterial studies which is ordered.  Prescription for diabetic shoes provided.  Discussed importance of daily foot inspection and glucose control. -Monitor for any clinical signs or symptoms of infection and directed to call the office immediately should any occur or go to the ER.  Return in about 2 weeks (around 03/10/2021).  Trula Slade DPM

## 2021-02-25 DIAGNOSIS — Z992 Dependence on renal dialysis: Secondary | ICD-10-CM | POA: Diagnosis not present

## 2021-02-25 DIAGNOSIS — N186 End stage renal disease: Secondary | ICD-10-CM | POA: Diagnosis not present

## 2021-02-25 DIAGNOSIS — N2581 Secondary hyperparathyroidism of renal origin: Secondary | ICD-10-CM | POA: Diagnosis not present

## 2021-02-26 ENCOUNTER — Ambulatory Visit (INDEPENDENT_AMBULATORY_CARE_PROVIDER_SITE_OTHER): Payer: Medicare HMO | Admitting: Internal Medicine

## 2021-02-26 ENCOUNTER — Other Ambulatory Visit: Payer: Self-pay

## 2021-02-26 ENCOUNTER — Encounter: Payer: Self-pay | Admitting: Internal Medicine

## 2021-02-26 DIAGNOSIS — N1831 Chronic kidney disease, stage 3a: Secondary | ICD-10-CM | POA: Diagnosis not present

## 2021-02-26 DIAGNOSIS — Z794 Long term (current) use of insulin: Secondary | ICD-10-CM | POA: Diagnosis not present

## 2021-02-26 DIAGNOSIS — E1122 Type 2 diabetes mellitus with diabetic chronic kidney disease: Secondary | ICD-10-CM | POA: Diagnosis not present

## 2021-02-26 DIAGNOSIS — I469 Cardiac arrest, cause unspecified: Secondary | ICD-10-CM

## 2021-02-26 DIAGNOSIS — Z6841 Body Mass Index (BMI) 40.0 and over, adult: Secondary | ICD-10-CM | POA: Diagnosis not present

## 2021-02-26 DIAGNOSIS — K649 Unspecified hemorrhoids: Secondary | ICD-10-CM | POA: Diagnosis not present

## 2021-02-26 DIAGNOSIS — Z992 Dependence on renal dialysis: Secondary | ICD-10-CM

## 2021-02-26 DIAGNOSIS — N186 End stage renal disease: Secondary | ICD-10-CM

## 2021-02-26 DIAGNOSIS — F419 Anxiety disorder, unspecified: Secondary | ICD-10-CM | POA: Diagnosis not present

## 2021-02-26 DIAGNOSIS — R0789 Other chest pain: Secondary | ICD-10-CM | POA: Diagnosis not present

## 2021-02-26 DIAGNOSIS — I5020 Unspecified systolic (congestive) heart failure: Secondary | ICD-10-CM

## 2021-02-26 MED ORDER — CLONAZEPAM 0.5 MG PO TABS: 0.5000 mg | ORAL_TABLET | Freq: Two times a day (BID) | ORAL | 3 refills | Status: DC | PRN

## 2021-02-26 MED ORDER — HYDROCORTISONE ACETATE 25 MG RE SUPP: 25.0000 mg | Freq: Two times a day (BID) | RECTAL | 0 refills | Status: DC

## 2021-02-26 NOTE — Progress Notes (Signed)
   Subjective:   Patient ID: [name] [name], male    DOB: 1965-12-10, 55 y.o.   MRN: [id number]  HPI The patient is a 55 YO man coming in for hospital follow up (in hospital multiple times since our last visit, for cardiac arrest related to high potassium related to problems with his dialysis catheter, most recent admission related to dialysis catheter not working properly and needing to be switched with high troponin and new reduced EF to 20-25%, underwent cath and more stents, started on brilinta). He is still sore in the chest from the compressions. He originally states he did not have cardiac arrest then describes waking to lots of people over him, is taking his medications as prescribed. Seeing cardiology in a couple of weeks. Labs yesterday at dialysis and states he was told that they were all normal. Is having hemorrhoids and these are not helped by otc cream and prior issues with the prescription getting covered.   PMH, [facility], social history reviewed and updated  Review of Systems  Constitutional: Positive for fatigue.  HENT: Negative.   Eyes: Negative.   Respiratory: Negative for cough, chest tightness and shortness of breath.   Cardiovascular: Negative for chest pain, palpitations and leg swelling.  Gastrointestinal: Negative for abdominal distention, abdominal pain, constipation, diarrhea, nausea and vomiting.       Anal pain  Musculoskeletal: Negative.   Skin: Negative.   Neurological: Negative.   Psychiatric/Behavioral: Negative.     Objective:  Physical Exam Constitutional:      Appearance: He is well-developed. He is obese.  HENT:     Head: Normocephalic and atraumatic.  Cardiovascular:     Rate and Rhythm: Normal rate and regular rhythm.  Pulmonary:     Effort: Pulmonary effort is normal. No respiratory distress.     Breath sounds: Normal breath sounds. No wheezing or rales.  Abdominal:     General: Bowel sounds are normal. There is no distension.     Palpations:  Abdomen is soft.     Tenderness: There is no abdominal tenderness. There is no rebound.  Musculoskeletal:     Cervical back: Normal range of motion.  Skin:    General: Skin is warm and dry.  Neurological:     Mental Status: He is alert and oriented to person, place, and time.     Coordination: Coordination normal.     Vitals:   02/26/21 1046  BP: 134/86  Pulse: (!) 101  Resp: 18  Temp: 98.9 F (37.2 C)  TempSrc: Oral  SpO2: 95%  Weight: 297 lb (134.7 kg)  Height: 5\' 10"  (9.357 m)    This visit occurred during the SARS-CoV-2 public health emergency.  Safety protocols were in place, including screening questions prior to the visit, additional usage of staff PPE, and extensive cleaning of exam room while observing appropriate contact time as indicated for disinfecting solutions.   Assessment & Plan:

## 2021-02-26 NOTE — Discharge Summary (Signed)
Physician Discharge Summary  [name] OFB:[id number] DOB: Mar 25, 1966 DOA: 02/13/2021  PCP: Hoyt Koch, MD  Admit date: 02/13/2021 Discharge date: 02/26/2021  Admitted From: HOME  Disposition:  HOME.   Recommendations for Outpatient Follow-up:  1. Follow up with PCP in 1-2 weeks 2. Please obtain BMP/CBC in one week Please follow up with cardiology and nephrology as recommended.   Discharge Condition: guarded.  CODE STATUS:FULL CODE.  Diet recommendation: Heart Healthy    Brief/Interim Summary: 55 year old gentleman with prior history of end-stage renal disease on dialysis on Tuesdays Thursdays and Saturdays, multiple upper extremity AV access procedures, recent tunneled HD catheter placed recently appears to be malfunctioning and he could not complete his dialysis on 02/13/2021. He was brought into ED for possible exchange of the catheter. While in the ED patient reports he had atypical chest pain. Chest pain has since resolved. He underwent HD catheter exchange on 02/14/21.   Discharge Diagnoses:  Active Problems:   HLD (hyperlipidemia)   OSA (obstructive sleep apnea)   Severe obesity (BMI >= 40) (HCC)   Diabetes mellitus, type 2 (HCC)   Diabetic peripheral neuropathy (HCC)   Anemia due to chronic kidney disease   HFrEF (heart failure with reduced ejection fraction) (HCC)   ESRD on dialysis (HCC)   Hyperkalemia   Iron deficiency anemia, unspecified   Atrial Fibrillation/Flutter   Hypotension   Chest pain   Non-ST elevation (NSTEMI) myocardial infarction [facility])   Chest pain:    Minimally elevated troponins.   EF notably reduced further this admission at 25-30% (prior 40-45%) with elevated troponin. Underwent cardiac cath yesterday noted above with patent prior mLAD stent, but total occlusion at distal edge of stent. PCI/DES x1 to mLAD below prior stenting. Given concern for thrombotic event, he was transitioned to [place].  -- on DAPT with ASA/Brilinta, statin,  unable to add BB with soft bps requiring midodrine. No ACE/ARB with ESRD. Cardiology recommends aspirin for 2 weeks , along with brililnta for 1 year and continue with coumadin.      ESRD ON HD TTS scheduled.  Follows up with Dr Carolin Sicks as outpatient.  Hd as per Nephrology.   Hyperkalemia  On valtessa. Resolved.    H/o CAD s/p PCI IN 10/21 Currently chest pain free.  Continue with aspirin and lipitor.    Diabetes mellitus:  uncontrolled wtith hypoglycemia.  Insulin dependent with ESRD.    Last A1c is 7.2% resume home meds.  Hypoglycemia probably from NPO.   HD catheter malfunction:  Replaced by vascular surgery    Hypotension:  Chronic and is on midodrine.    Chronic systolic heart failure:  Fluid management with HD     Atrial flutter  Rate controlled and on coumadin for anti coagulation, .   Anemia of chronic disease secondary to ESRD:  Baseline hemoglobin around 11 and stable.    OSA;  On CPAP at home.   Body mass index is 43.15 kg/m. Morbid obesity.  Outpatient follow up with PCp.      Discharge Instructions  Discharge Instructions    Amb Referral to Cardiac Rehabilitation   Complete by: As directed    Diagnosis: Coronary Stents   After initial evaluation and assessments completed: Virtual Based Care may be provided alone or in conjunction with Phase 2 Cardiac Rehab based on patient barriers.: Yes   Diet - low sodium heart healthy   Complete by: As directed    Discharge instructions   Complete by: As directed    Please  follow up with cardiology as recommended.   No wound care   Complete by: As directed      Allergies as of 02/18/2021      Reactions   Shellfish-derived Products Anaphylaxis   Ace Inhibitors Cough   Hydrocodone Other (See Comments)   Hallucinations   Pork-derived Products Other (See Comments)   Entry determined to be clinically insignificant: OK TO GIVE HEPARIN (has tolerated well in the  past) Patient had allergic episode in 1989 after he ate pork at a fair. He developed fever and rash and went to the ED, but he did not have breathing issues. Received heparin in the past. Patient ok with removing pork allergy and listing as intolerance instead.      Medication List    STOP taking these medications   cephALEXin 500 MG capsule Commonly known as: KEFLEX   clopidogrel 75 MG tablet Commonly known as: PLAVIX   predniSONE 50 MG tablet Commonly known as: DELTASONE     TAKE these medications   A.E.R. Witch Hazel pad Generic drug: witch hazel-glycerin Apply topically as needed for itching.   Accu-Chek FastClix Lancets Misc USE TO CHECK BLOOD SUGARS 4 TIMES A DAY.   Accu-Chek Guide test strip Generic drug: glucose blood USE AS INSTRUCTED TO CHECK BLOOD SUGAR 3 TIMES DAILY AS NEEDED   allopurinol 100 MG tablet Commonly known as: ZYLOPRIM Take 1 tablet (100 mg total) by mouth 3 (three) times a week. After dialysis What changed: when to take this   Aspirin Low Dose 81 MG EC tablet Generic drug: aspirin Take 1 tablet (81 mg total) by mouth daily for 14 days. Swallow whole.   atorvastatin 80 MG tablet Commonly known as: LIPITOR Take 1 tablet (80 mg total) by mouth daily.   Auryxia 1 GM 210 MG(Fe) tablet Generic drug: ferric citrate Take 630 mg by mouth 3 (three) times daily with meals. Takes 3 tablets with meal and 2 tablet with snacks   Brilinta 90 MG Tabs tablet Generic drug: ticagrelor Take 1 tablet (90 mg total) by mouth 2 (two) times daily.   busPIRone 5 MG tablet Commonly known as: BUSPAR TAKE 1 TABLET TWICE DAILY AS NEEDED What changed: when to take this   clonazePAM 0.5 MG tablet Commonly known as: KLONOPIN Use 1-2 tablets prior to dialysis 3 times weekly. What changed:   how much to take  how to take this  when to take this  additional instructions   diphenhydrAMINE 25 MG tablet Commonly known as: BENADRYL Take 1 tablet (25 mg total) by  mouth every 8 (eight) hours as needed for itching.   Insulin Pen Needle 32G X 8 MM Misc Use with insulin x5 times a day What changed:   how much to take  how to take this  when to take this   Droplet Pen Needles 32G X 4 MM Misc Generic drug: Insulin Pen Needle USE TO INJECT FIVE TIMES DAILY AS DIRECTED What changed: Another medication with the same name was changed. Make sure you understand how and when to take each.   metoprolol succinate 25 MG 24 hr tablet Commonly known as: TOPROL-XL Take 0.5 tablets (12.5 mg total) by mouth at bedtime.   midodrine 10 MG tablet Commonly known as: PROAMATINE Take 10 mg by mouth 3 (three) times daily.   MIRCERA IJ Mircera   mupirocin ointment 2 % Commonly known as: BACTROBAN Apply 1 application topically 2 (two) times daily.   pantoprazole 40 MG tablet Commonly known as:  PROTONIX Take 1 tablet (40 mg total) by mouth daily.   Proctozone-HC 2.5 % rectal cream Generic drug: hydrocortisone Place rectally 4 (four) times daily.   senna-docusate 8.6-50 MG tablet Commonly known as: Senokot-S Take 2 tablets by mouth 2 (two) times daily.   sevelamer carbonate 800 MG tablet Commonly known as: RENVELA Take 2,400-3,200 mg by mouth in the morning, at noon, in the evening, and at bedtime. Takes 4 tablets with meals and 2 tablets with snacks   Toujeo Max SoloStar 300 UNIT/ML Solostar Pen Generic drug: insulin glargine (2 Unit Dial) INJECT 80 UNITS UNDER THE SKIN IN THE MORNING AND 80 UNITS IN THE EVENING What changed:   how much to take  how to take this  when to take this  additional instructions   Veltassa 8.4 g packet Generic drug: patiromer Take 8.4 g by mouth daily.   warfarin 5 MG tablet Commonly known as: COUMADIN Take as directed. If you are unsure how to take this medication, talk to your nurse or doctor. Original instructions: TAKE 1 TABLET EVERY DAY OR AS DIRECTED BY COUMADIN CLINIC ADMIN INSTRUCTIONS What changed: See  the new instructions.       Follow-up Information    Hoyt Koch, MD. Schedule an appointment as soon as possible for a visit in 1 week(s).   Specialty: Internal Medicine Contact information: [place] [address] [phone]        Burnell Blanks, MD .   Specialty: Cardiology Contact information: [address] [place] [address] [phone]              Allergies  Allergen Reactions  . Shellfish-Derived Products Anaphylaxis  . Ace Inhibitors Cough  . Hydrocodone Other (See Comments)    Hallucinations  . Pork-Derived Products Other (See Comments)    Entry determined to be clinically insignificant: OK TO GIVE HEPARIN (has tolerated well in the past)  Patient had allergic episode in 1989 after he ate pork at a fair. He developed fever and rash and went to the ED, but he did not have breathing issues. Received heparin in the past. Patient ok with removing pork allergy and listing as intolerance instead.    Consultations:  Vascular surgery.   Nephrology.    Procedures/Studies: DG Chest 2 View  Result Date: 02/13/2021 CLINICAL DATA:  Chest pain EXAM: CHEST - 2 VIEW COMPARISON:  Radiograph 01/16/2021, CT 12/13/2007 FINDINGS: Tunneled right IJ approach dual lumen dialysis catheter tip terminates in the superior cavoatrial junction. Vascular stenting noted in the right axillary tissues. Telemetry leads overlie the chest. There is diffuse pulmonary vascular congestion with hazy and fine interstitial opacities throughout both lungs in a mid to lower lung predominance. Some peripheral septal lines are present as well. Cardiomegaly is similar to comparison exams accounting for differences in technique. The aorta is calcified. The remaining cardiomediastinal contours are unremarkable. No acute osseous or soft tissue abnormality. IMPRESSION: Features suggestive of CHF/volume overload with cardiomegaly, vascular congestion and  pulmonary edema. Aortic Atherosclerosis (ICD10-I70.0). Electronically Signed   By: Lovena Le M.D.   On: 02/13/2021 23:14   CARDIAC CATHETERIZATION  Result Date: 02/17/2021  1st Mrg lesion is 100% stenosed.  Previously placed Prox LAD to Mid LAD stent (unknown type) is widely patent.  Mid LAD lesion is 100% stenosed.  Ramus lesion is 100% stenosed.  A drug-eluting stent was successfully placed using a STENT RESOLUTE ONYX [id number].  Post intervention, there is a 0% residual stenosis.  Post intervention, there is a 0% residual stenosis.  1. Patent mid LAD stent. Total occlusion of the mid LAD stent just off of the distal edge of the old stent. 2. Total occlusion of the intermediate branch 3. Total occlusion small obtuse marginal branch 4. Patent RCA with minimal plaque. This vessel supplies collaterals to the intermediate branch and the obtuse marginal branch. 5. Successful PTCA/DES x 1 mid LAD Recommendations: Will continue DAPT with ASA and Brilinta for one month then stop ASA. He has been on Plavix at home but this was held recently for unknown reason. Given the possible thrombotic nature of this lesion in the LAD, I think Brilinta should be used instead of Plavix for the next 12 months if possible. Resume coumadin.   DG CHEST PORT 1 VIEW  Result Date: 02/16/2021 CLINICAL DATA:  Short of breath, chest pains EXAM: PORTABLE CHEST 1 VIEW COMPARISON:  Prior chest x-ray 02/15/2021 FINDINGS: Well-positioned right IJ tunneled hemodialysis catheter. Catheter tip overlies the mid right atrium. Stable cardiomegaly and mediastinal contours. Atherosclerotic calcifications again noted in the transverse aorta. Similar appearance of pulmonary vascular congestion bordering on mild edema. No evidence of pneumothorax. No large pleural effusion. No acute osseous abnormality. Metallic stents present in the right upper extremity axillary vein. IMPRESSION: 1. Cardiomegaly and pulmonary vascular congestion bordering on mild  edema appears similar compared to 02/14/2021. 2. Stable and except will position of right IJ tunneled hemodialysis catheter. 3. No evidence of pneumothorax. Electronically Signed   By: Jacqulynn Cadet M.D.   On: 02/16/2021 10:04   DG Chest Port 1 View  Result Date: 02/14/2021 CLINICAL DATA:  Catheter placement EXAM: PORTABLE CHEST 1 VIEW COMPARISON:  02/14/2021 FINDINGS: Right-sided central venous catheter tip over the mid right atrium. Incompletely visualized left lung base. Cardiomegaly with vascular congestion and mild edema. Aortic atherosclerosis. No pneumothorax. Upper normal mediastinal silhouette, likely due to portable technique and slight degree of patient rotation. IMPRESSION: 1. Right-sided central venous catheter tip over the mid right atrium. No pneumothorax. 2. Cardiomegaly with vascular congestion, mild pulmonary edema and probable pleural effusions. Electronically Signed   By: Donavan Foil M.D.   On: 02/14/2021 18:18   DG Foot Complete Right  Result Date: 02/03/2021 Please see detailed radiograph report in office note.  ECHOCARDIOGRAM COMPLETE  Result Date: 02/15/2021    ECHOCARDIOGRAM REPORT   Patient Name:   [name] [name] Date of Exam: 02/15/2021 Medical Rec #:  [id number]      Height:       70.0 in Accession #:    [id number]     Weight:       303.8 lb Date of Birth:  01/31/1966      BSA:          2.493 m Patient Age:    89 years       BP:           78/40 mmHg Patient Gender: M              HR:           90 bpm. Exam Location:  Inpatient Procedure: 2D Echo, Cardiac Doppler, Color Doppler and Intracardiac            Opacification Agent Indications:    Elevated troponin  History:        Patient has prior history of Echocardiogram examinations, most                 recent 07/29/2020. CHF, Previous Myocardial Infarction,  COPD,                 Arrythmias:Atrial Fibrillation, Signs/Symptoms:Murmur; Risk                 Factors:Hypertension, Diabetes and Dyslipidemia.  Sonographer:    Luisa Hart RDCS Referring Phys: [id number] Hulen Shouts RHYNE  Sonographer Comments: 07/27/2020 cath IMPRESSIONS  1. Left ventricular ejection fraction, by estimation, is 25 to 30%. The left ventricle has severely decreased function. The left ventricle demonstrates regional wall motion abnormalities. There is global hypokinesis with disproportionately severe hypokinesis of the left ventricular, mid-apical anteroseptal wall, anterior wall, apical segment and inferior wall. There is moderate concentric left ventricular hypertrophy. Left ventricular diastolic function could not be evaluated.  2. Right ventricular systolic function is moderately reduced. The right ventricular size is moderately enlarged. There is moderately elevated pulmonary artery systolic pressure.  3. Left atrial size was severely dilated.  4. A small pericardial effusion is present. The pericardial effusion is circumferential. There is no evidence of cardiac tamponade.  5. The mitral valve is degenerative. Mild mitral valve regurgitation. Mild mitral stenosis. The mean mitral valve gradient is 4.8 mmHg. Severe mitral annular calcification.  6. Tricuspid valve regurgitation is moderate.  7. The aortic valve is tricuspid. There is mild calcification of the aortic valve. There is mild thickening of the aortic valve. Aortic valve regurgitation is mild. Mild aortic valve sclerosis is present, with no evidence of aortic valve stenosis.  8. The inferior vena cava is dilated in size with <50% respiratory variability, suggesting right atrial pressure of 15 mmHg. Comparison(s): The left ventricular function is significantly worse. The left ventricular wall motion abnormalities are significantly worse. There appears to be interval ischemia or infarction in the LAD artery distribution. FINDINGS  Left Ventricle: Left ventricular ejection fraction, by estimation, is 25 to 30%. The left ventricle has severely decreased function. The left ventricle demonstrates regional wall  motion abnormalities. There is global hypokinesis with disproportionately severe hypokinesis of the left ventricular, mid-apical anteroseptal wall, anterior wall, apical segment and inferior wall. Definity contrast agent was given IV to delineate the left ventricular endocardial borders. The left ventricular internal cavity size was normal in size. There is moderate concentric left ventricular hypertrophy. Abnormal (paradoxical) septal motion, consistent with left bundle branch block. Left ventricular diastolic function could not be evaluated due to atrial fibrillation. Left ventricular diastolic function could not be evaluated. Right Ventricle: The right ventricular size is moderately enlarged. No increase in right ventricular wall thickness. Right ventricular systolic function is moderately reduced. There is moderately elevated pulmonary artery systolic pressure. The tricuspid  regurgitant velocity is 3.17 m/s, and with an assumed right atrial pressure of 15 mmHg, the estimated right ventricular systolic pressure is 40.8 mmHg. Left Atrium: Left atrial size was severely dilated. Right Atrium: Right atrial size was normal in size. Pericardium: A small pericardial effusion is present. The pericardial effusion is circumferential. There is no evidence of cardiac tamponade. Mitral Valve: The mitral valve is degenerative in appearance. Severe mitral annular calcification. Mild mitral valve regurgitation. Mild mitral valve stenosis. The mean mitral valve gradient is 4.8 mmHg with average heart rate of 85 bpm. Tricuspid Valve: The tricuspid valve is normal in structure. Tricuspid valve regurgitation is moderate. Aortic Valve: The aortic valve is tricuspid. There is mild calcification of the aortic valve. There is mild thickening of the aortic valve. Aortic valve regurgitation is mild. Aortic regurgitation PHT measures 336 msec. Mild aortic valve sclerosis is present, with  no evidence of aortic valve stenosis. Aortic valve  mean gradient measures 5.0 mmHg. Aortic valve peak gradient measures 10.1 mmHg. Aortic valve area, by VTI measures 2.10 cm. Pulmonic Valve: The pulmonic valve was not well visualized. Pulmonic valve regurgitation is not visualized. Aorta: The aortic root and ascending aorta are structurally normal, with no evidence of dilitation. Venous: The inferior vena cava is dilated in size with less than 50% respiratory variability, suggesting right atrial pressure of 15 mmHg. IAS/Shunts: No atrial level shunt detected by color flow Doppler.  LEFT VENTRICLE PLAX 2D LVIDd:         4.90 cm      Diastology LVIDs:         3.20 cm      LV e' medial:  3.60 cm/s LV PW:         1.50 cm      LV e' lateral: 5.80 cm/s LV IVS:        1.40 cm LVOT diam:     2.40 cm LV SV:         59 LV SV Index:   24 LVOT Area:     4.52 cm  LV Volumes (MOD) LV vol d, MOD A2C: 146.0 ml LV vol d, MOD A4C: 154.0 ml LV vol s, MOD A2C: 116.0 ml LV vol s, MOD A4C: 96.4 ml LV SV MOD A2C:     30.0 ml LV SV MOD A4C:     154.0 ml LV SV MOD BP:      45.4 ml RIGHT VENTRICLE RV S prime:     7.03 cm/s TAPSE (M-mode): 1.4 cm LEFT ATRIUM             Index LA diam:        6.10 cm 2.45 cm/m LA Vol (A2C):   86.4 ml 34.66 ml/m LA Vol (A4C):   77.5 ml 31.09 ml/m LA Biplane Vol: 83.4 ml 33.45 ml/m  AORTIC VALVE                    PULMONIC VALVE AV Area (Vmax):    2.23 cm     PV Vmax:       0.88 m/s AV Area (Vmean):   2.07 cm     PV Vmean:      72.400 cm/s AV Area (VTI):     2.10 cm     PV VTI:        0.160 m AV Vmax:           159.00 cm/s  PV Peak grad:  3.1 mmHg AV Vmean:          105.500 cm/s PV Mean grad:  2.0 mmHg AV VTI:            0.280 m AV Peak Grad:      10.1 mmHg AV Mean Grad:      5.0 mmHg LVOT Vmax:         78.30 cm/s LVOT Vmean:        48.200 cm/s LVOT VTI:          0.130 m LVOT/AV VTI ratio: 0.46 AI PHT:            336 msec  AORTA Ao Root diam: 3.50 cm Ao Asc diam:  3.40 cm MITRAL VALVE              TRICUSPID VALVE MV Area (PHT): 3.76 cm   TR Peak grad:    40.2 mmHg MV Mean grad:  4.8 mmHg   TR Vmax:        317.00 cm/s MV Decel Time: 202 msec MR Peak grad: 45.2 mmHg   SHUNTS MR Mean grad: 30.0 mmHg   Systemic VTI:  0.13 m MR Vmax:      336.00 cm/s Systemic Diam: 2.40 cm MR Vmean:     259.0 cm/s Dani Gobble Croitoru MD Electronically signed by Sanda Klein MD Signature Date/Time: 02/15/2021/1:32:24 PM    Final    HYBRID OR IMAGING ([place])  Result Date: 02/14/2021 There is no interpretation for this exam.  This order is for images obtained during a surgical procedure.  Please See "Surgeries" Tab for more information regarding the procedure.       Subjective: No new complaints.   Discharge Exam: Vitals:   02/18/21 1230 02/18/21 1302  BP: (!) 102/43 (!) 102/43  Pulse: (!) 107 (!) 107  Resp: 20 20  Temp:  97.7 F (36.5 C)  SpO2:  92%   Vitals:   02/18/21 1130 02/18/21 1200 02/18/21 1230 02/18/21 1302  BP: 90/75 103/73 (!) 102/43 (!) 102/43  Pulse: (!) 107 (!) 103 (!) 107 (!) 107  Resp: (!) 27 20 20 20   Temp:    97.7 F (36.5 C)  TempSrc:    Oral  SpO2:    92%  Weight:    135.1 kg  Height:        General: Pt is alert, awake, not in acute distress Cardiovascular: RRR, S1/S2 +, no rubs, no gallops Respiratory: CTA bilaterally, no wheezing, no rhonchi Abdominal: Soft, NT, ND, bowel sounds + Extremities: no edema, no cyanosis    The results of significant diagnostics from this hospitalization (including imaging, microbiology, ancillary and laboratory) are listed below for reference.     Microbiology: No results found for this or any previous visit (from the past 240 hour(s)).   Labs: BNP (last 3 results) No results for input(s): BNP in the last 8760 hours. Basic Metabolic Panel: No results for input(s): NA, K, CL, CO2, GLUCOSE, BUN, CREATININE, CALCIUM, MG, PHOS in the last 168 hours. Liver Function Tests: No results for input(s): AST, ALT, ALKPHOS, BILITOT, PROT, ALBUMIN in the last 168 hours. No results for input(s):  LIPASE, AMYLASE in the last 168 hours. No results for input(s): AMMONIA in the last 168 hours. CBC: No results for input(s): WBC, NEUTROABS, HGB, HCT, MCV, PLT in the last 168 hours. Cardiac Enzymes: No results for input(s): CKTOTAL, CKMB, CKMBINDEX, TROPONINI in the last 168 hours. BNP: Invalid input(s): POCBNP CBG: No results for input(s): GLUCAP in the last 168 hours. D-Dimer No results for input(s): DDIMER in the last 72 hours. Hgb A1c No results for input(s): HGBA1C in the last 72 hours. Lipid Profile No results for input(s): CHOL, HDL, LDLCALC, TRIG, CHOLHDL, LDLDIRECT in the last 72 hours. Thyroid function studies No results for input(s): TSH, T4TOTAL, T3FREE, THYROIDAB in the last 72 hours.  Invalid input(s): FREET3 Anemia work up No results for input(s): VITAMINB12, FOLATE, FERRITIN, TIBC, IRON, RETICCTPCT in the last 72 hours. Urinalysis    Component Value Date/Time   COLORURINE YELLOW 10/13/2015 1455   APPEARANCEUR CLOUDY (A) 10/13/2015 1455   LABSPEC 1.021 10/13/2015 1455   PHURINE 5.0 10/13/2015 1455   GLUCOSEU 250 (A) 10/13/2015 1455   HGBUR MODERATE (A) 10/13/2015 1455   BILIRUBINUR NEGATIVE 10/13/2015 1455   KETONESUR NEGATIVE 10/13/2015 1455   PROTEINUR >300 (A) 10/13/2015 1455   UROBILINOGEN 0.2 10/24/2014 1223   NITRITE NEGATIVE 10/13/2015 1455  LEUKOCYTESUR SMALL (A) 10/13/2015 1455   Sepsis Labs Invalid input(s): PROCALCITONIN,  WBC,  LACTICIDVEN Microbiology No results found for this or any previous visit (from the past 240 hour(s)).   Time coordinating discharge: 36 MINUTES.   SIGNED:   Hosie Poisson, MD  [facility]

## 2021-02-26 NOTE — Patient Instructions (Addendum)
We have sent in the clonazepam for you and given you the letter.

## 2021-02-27 DIAGNOSIS — N2581 Secondary hyperparathyroidism of renal origin: Secondary | ICD-10-CM | POA: Diagnosis not present

## 2021-02-27 DIAGNOSIS — Z992 Dependence on renal dialysis: Secondary | ICD-10-CM | POA: Diagnosis not present

## 2021-02-27 DIAGNOSIS — N186 End stage renal disease: Secondary | ICD-10-CM | POA: Diagnosis not present

## 2021-02-28 ENCOUNTER — Telehealth: Payer: Self-pay

## 2021-02-28 NOTE — Assessment & Plan Note (Signed)
Does not have sugar readings but denies low sugars. Likely dialyzing most of the sugar currently. Taking glargine and humulin

## 2021-02-28 NOTE — Assessment & Plan Note (Signed)
We can change clonazepam back to BID as patient now informs me this is how he was taking this but not daily. Refilled today.

## 2021-02-28 NOTE — Assessment & Plan Note (Signed)
With hyperkalemia. Still with chest pain s/p compressions.

## 2021-02-28 NOTE — Assessment & Plan Note (Signed)
Causing significant discomfort and rx hydrocortisone suppositories as these have helped best in the past. Has tried rx hydrocortisone cream and otc without relief.

## 2021-02-28 NOTE — Telephone Encounter (Signed)
PA KEY:  [id number]  Anucort-HC 25MG  suppositories  Request denied

## 2021-02-28 NOTE — Assessment & Plan Note (Signed)
Muscular and likely s/p compressions from recent cardiac arrest.

## 2021-02-28 NOTE — Telephone Encounter (Signed)
PA KEY:  [id number]  Anucort-HC 25MG  suppositories  Pending response.

## 2021-02-28 NOTE — Assessment & Plan Note (Addendum)
Recent loss of EF due to heart attack and additional stents placed. He will follow up with cardiology and is taking brilinta temporarily. Is also on coumadin and counseled to monitor for dark stools or bleeding signs. States had labs at dialysis and these were stable.

## 2021-03-01 DIAGNOSIS — N2581 Secondary hyperparathyroidism of renal origin: Secondary | ICD-10-CM | POA: Diagnosis not present

## 2021-03-01 DIAGNOSIS — N186 End stage renal disease: Secondary | ICD-10-CM | POA: Diagnosis not present

## 2021-03-01 DIAGNOSIS — Z992 Dependence on renal dialysis: Secondary | ICD-10-CM | POA: Diagnosis not present

## 2021-03-03 ENCOUNTER — Encounter (HOSPITAL_COMMUNITY): Payer: Medicare HMO

## 2021-03-04 DIAGNOSIS — Z79899 Other long term (current) drug therapy: Secondary | ICD-10-CM | POA: Diagnosis not present

## 2021-03-04 DIAGNOSIS — N186 End stage renal disease: Secondary | ICD-10-CM | POA: Diagnosis not present

## 2021-03-04 DIAGNOSIS — Z992 Dependence on renal dialysis: Secondary | ICD-10-CM | POA: Diagnosis not present

## 2021-03-04 DIAGNOSIS — E1122 Type 2 diabetes mellitus with diabetic chronic kidney disease: Secondary | ICD-10-CM | POA: Diagnosis not present

## 2021-03-04 DIAGNOSIS — I120 Hypertensive chronic kidney disease with stage 5 chronic kidney disease or end stage renal disease: Secondary | ICD-10-CM | POA: Diagnosis not present

## 2021-03-04 DIAGNOSIS — R6889 Other general symptoms and signs: Secondary | ICD-10-CM | POA: Diagnosis not present

## 2021-03-04 DIAGNOSIS — Z940 Kidney transplant status: Secondary | ICD-10-CM | POA: Diagnosis not present

## 2021-03-04 DIAGNOSIS — Z794 Long term (current) use of insulin: Secondary | ICD-10-CM | POA: Diagnosis not present

## 2021-03-05 DIAGNOSIS — N186 End stage renal disease: Secondary | ICD-10-CM | POA: Diagnosis not present

## 2021-03-05 DIAGNOSIS — Z992 Dependence on renal dialysis: Secondary | ICD-10-CM | POA: Diagnosis not present

## 2021-03-05 DIAGNOSIS — N2581 Secondary hyperparathyroidism of renal origin: Secondary | ICD-10-CM | POA: Diagnosis not present

## 2021-03-06 ENCOUNTER — Other Ambulatory Visit (HOSPITAL_COMMUNITY): Payer: Self-pay | Admitting: Podiatry

## 2021-03-06 DIAGNOSIS — Z992 Dependence on renal dialysis: Secondary | ICD-10-CM | POA: Diagnosis not present

## 2021-03-06 DIAGNOSIS — L97911 Non-pressure chronic ulcer of unspecified part of right lower leg limited to breakdown of skin: Secondary | ICD-10-CM

## 2021-03-06 DIAGNOSIS — N2581 Secondary hyperparathyroidism of renal origin: Secondary | ICD-10-CM | POA: Diagnosis not present

## 2021-03-06 DIAGNOSIS — N186 End stage renal disease: Secondary | ICD-10-CM | POA: Diagnosis not present

## 2021-03-07 ENCOUNTER — Telehealth: Payer: Self-pay

## 2021-03-07 ENCOUNTER — Ambulatory Visit (HOSPITAL_COMMUNITY)
Admission: RE | Admit: 2021-03-07 | Discharge: 2021-03-07 | Disposition: A | Discharge status: Home / Self Care | Payer: Medicare HMO | Source: Ambulatory Visit | Attending: Podiatry | Admitting: Podiatry

## 2021-03-07 ENCOUNTER — Other Ambulatory Visit: Payer: Self-pay | Admitting: Podiatry

## 2021-03-07 ENCOUNTER — Telehealth: Payer: Self-pay | Admitting: Internal Medicine

## 2021-03-07 ENCOUNTER — Other Ambulatory Visit: Payer: Self-pay

## 2021-03-07 DIAGNOSIS — L97921 Non-pressure chronic ulcer of unspecified part of left lower leg limited to breakdown of skin: Secondary | ICD-10-CM

## 2021-03-07 DIAGNOSIS — L97911 Non-pressure chronic ulcer of unspecified part of right lower leg limited to breakdown of skin: Secondary | ICD-10-CM | POA: Insufficient documentation

## 2021-03-07 DIAGNOSIS — L97512 Non-pressure chronic ulcer of other part of right foot with fat layer exposed: Secondary | ICD-10-CM

## 2021-03-07 DIAGNOSIS — I739 Peripheral vascular disease, unspecified: Secondary | ICD-10-CM

## 2021-03-07 MED ORDER — HYDROCORTISONE ACETATE 25 MG RE SUPP: 25.0000 mg | Freq: Two times a day (BID) | RECTAL | 0 refills | Status: DC

## 2021-03-07 NOTE — Telephone Encounter (Signed)
External lab result from [facility] on 02/20/21: INR 2.87 & noted to be in range at that time.

## 2021-03-07 NOTE — Telephone Encounter (Signed)
Okay to send anusol to mail order. We have already sent in the clonazepam and do not typically send in 90 day supplies of this medication he will need to fill monthly.

## 2021-03-07 NOTE — Telephone Encounter (Signed)
See below

## 2021-03-07 NOTE — Telephone Encounter (Signed)
Medication has been sent to the pharmacy. 

## 2021-03-07 NOTE — Telephone Encounter (Signed)
Patient called and requested that clonazePAM (KLONOPIN) 0.5 MG tablet 90 day supply and hydrocortisone (ANUSOL-HC) 25 MG suppository be sent to [place], [place]. Please advise

## 2021-03-07 NOTE — Telephone Encounter (Signed)
Medical treatment/procedure(s) were performed by non-physician practitioner and as supervising physician I was immediately available for consultation/collaboration. I agree with above. Sarenity Ramaker A Zeah Germano, MD  

## 2021-03-08 DIAGNOSIS — N186 End stage renal disease: Secondary | ICD-10-CM | POA: Diagnosis not present

## 2021-03-08 DIAGNOSIS — Z992 Dependence on renal dialysis: Secondary | ICD-10-CM | POA: Diagnosis not present

## 2021-03-08 DIAGNOSIS — N2581 Secondary hyperparathyroidism of renal origin: Secondary | ICD-10-CM | POA: Diagnosis not present

## 2021-03-09 DIAGNOSIS — E1142 Type 2 diabetes mellitus with diabetic polyneuropathy: Secondary | ICD-10-CM | POA: Diagnosis not present

## 2021-03-09 DIAGNOSIS — R2681 Unsteadiness on feet: Secondary | ICD-10-CM | POA: Diagnosis not present

## 2021-03-09 DIAGNOSIS — M4627 Osteomyelitis of vertebra, lumbosacral region: Secondary | ICD-10-CM | POA: Diagnosis not present

## 2021-03-09 DIAGNOSIS — I38 Endocarditis, valve unspecified: Secondary | ICD-10-CM | POA: Diagnosis not present

## 2021-03-09 DIAGNOSIS — I358 Other nonrheumatic aortic valve disorders: Secondary | ICD-10-CM | POA: Diagnosis not present

## 2021-03-10 DIAGNOSIS — N2581 Secondary hyperparathyroidism of renal origin: Secondary | ICD-10-CM | POA: Diagnosis not present

## 2021-03-10 DIAGNOSIS — Z992 Dependence on renal dialysis: Secondary | ICD-10-CM | POA: Diagnosis not present

## 2021-03-10 DIAGNOSIS — N186 End stage renal disease: Secondary | ICD-10-CM | POA: Diagnosis not present

## 2021-03-11 DIAGNOSIS — N186 End stage renal disease: Secondary | ICD-10-CM | POA: Diagnosis not present

## 2021-03-11 DIAGNOSIS — I5022 Chronic systolic (congestive) heart failure: Secondary | ICD-10-CM | POA: Diagnosis not present

## 2021-03-11 DIAGNOSIS — Z794 Long term (current) use of insulin: Secondary | ICD-10-CM | POA: Diagnosis not present

## 2021-03-11 DIAGNOSIS — I132 Hypertensive heart and chronic kidney disease with heart failure and with stage 5 chronic kidney disease, or end stage renal disease: Secondary | ICD-10-CM | POA: Diagnosis not present

## 2021-03-11 DIAGNOSIS — Z01812 Encounter for preprocedural laboratory examination: Secondary | ICD-10-CM | POA: Diagnosis not present

## 2021-03-11 DIAGNOSIS — Z940 Kidney transplant status: Secondary | ICD-10-CM | POA: Diagnosis not present

## 2021-03-11 DIAGNOSIS — I4892 Unspecified atrial flutter: Secondary | ICD-10-CM | POA: Diagnosis not present

## 2021-03-11 DIAGNOSIS — R6889 Other general symptoms and signs: Secondary | ICD-10-CM | POA: Diagnosis not present

## 2021-03-11 DIAGNOSIS — E1122 Type 2 diabetes mellitus with diabetic chronic kidney disease: Secondary | ICD-10-CM | POA: Diagnosis not present

## 2021-03-11 DIAGNOSIS — Z992 Dependence on renal dialysis: Secondary | ICD-10-CM | POA: Diagnosis not present

## 2021-03-13 DIAGNOSIS — N186 End stage renal disease: Secondary | ICD-10-CM | POA: Diagnosis not present

## 2021-03-13 DIAGNOSIS — Z992 Dependence on renal dialysis: Secondary | ICD-10-CM | POA: Diagnosis not present

## 2021-03-13 DIAGNOSIS — N2581 Secondary hyperparathyroidism of renal origin: Secondary | ICD-10-CM | POA: Diagnosis not present

## 2021-03-15 DIAGNOSIS — Z992 Dependence on renal dialysis: Secondary | ICD-10-CM | POA: Diagnosis not present

## 2021-03-15 DIAGNOSIS — N186 End stage renal disease: Secondary | ICD-10-CM | POA: Diagnosis not present

## 2021-03-15 DIAGNOSIS — N2581 Secondary hyperparathyroidism of renal origin: Secondary | ICD-10-CM | POA: Diagnosis not present

## 2021-03-17 ENCOUNTER — Ambulatory Visit (INDEPENDENT_AMBULATORY_CARE_PROVIDER_SITE_OTHER): Payer: Medicare HMO | Admitting: Cardiovascular Disease

## 2021-03-17 ENCOUNTER — Encounter: Payer: Self-pay | Admitting: Cardiovascular Disease

## 2021-03-17 ENCOUNTER — Other Ambulatory Visit: Payer: Self-pay

## 2021-03-17 ENCOUNTER — Ambulatory Visit (INDEPENDENT_AMBULATORY_CARE_PROVIDER_SITE_OTHER): Payer: Medicare HMO | Admitting: Podiatry

## 2021-03-17 ENCOUNTER — Ambulatory Visit: Payer: Medicare HMO | Admitting: Physician Assistant

## 2021-03-17 VITALS — BP 122/70 | HR 73 | Ht 70.0 in | Wt 288.0 lb

## 2021-03-17 DIAGNOSIS — E1149 Type 2 diabetes mellitus with other diabetic neurological complication: Secondary | ICD-10-CM

## 2021-03-17 DIAGNOSIS — I255 Ischemic cardiomyopathy: Secondary | ICD-10-CM | POA: Diagnosis not present

## 2021-03-17 DIAGNOSIS — I739 Peripheral vascular disease, unspecified: Secondary | ICD-10-CM

## 2021-03-17 DIAGNOSIS — I480 Paroxysmal atrial fibrillation: Secondary | ICD-10-CM | POA: Diagnosis not present

## 2021-03-17 DIAGNOSIS — I2510 Atherosclerotic heart disease of native coronary artery without angina pectoris: Secondary | ICD-10-CM

## 2021-03-17 DIAGNOSIS — L97509 Non-pressure chronic ulcer of other part of unspecified foot with unspecified severity: Secondary | ICD-10-CM | POA: Diagnosis not present

## 2021-03-17 DIAGNOSIS — L97512 Non-pressure chronic ulcer of other part of right foot with fat layer exposed: Secondary | ICD-10-CM

## 2021-03-17 DIAGNOSIS — S90425A Blister (nonthermal), left lesser toe(s), initial encounter: Secondary | ICD-10-CM | POA: Diagnosis not present

## 2021-03-17 MED ORDER — TICAGRELOR 90 MG PO TABS: 90.0000 mg | ORAL_TABLET | Freq: Two times a day (BID) | ORAL | 3 refills | Status: AC

## 2021-03-17 NOTE — Progress Notes (Signed)
Chief Complaint  Patient presents with  . Follow-up    CAD   History of Present Illness: 55 yo [name] male with history of aortic valve insufficiency, DM, HTN, HLD, morbid obesity, ESRD on HD, prior endocarditis, atrial fibrillation and DVT who is here today for cardiac follow up. He is s/p failed kidney transpant in 2009 at [facility] in [place]. I saw him for the first time in 2011 for evaluation of chest pain. He has chronic leg weakness and swelling and is mostly immobile. Echo in 2011 showed moderate LVH with normal LV systolic function. Since then he has had evidence of aortic valve insufficiency. He was admitted to [facility] and then [facility] in 2016 with discitis and he had evidence of aortic valve endocarditis. The plan was for antibiotics and then surgery but he did not tolerate his cardiac cath and no surgery was performed. He missed all f/u appts at [facility] with the cardiology division. He did see ID at [facility] and blood cultures normalized. Echo February 2021 with LVEF=55-60%, mild MR and mild AI. He has a prior DVT and has been on coumadin.  He was found to be in atrial flutter in January 2020 and was seen in the atrial fibrillation clinic and no changes were recommended. He was seen in our office October 2021 by Truitt Merle, NP and c/o chest pressure. A stress test was arranged but he was admitted to [facility] 07/27/20 with chest pain and atrial fib with RVR and ruled in for a NSTEMI. Cardiac cath 07/27/20 with severe stenosis in the proximal LAD treated with a drug eluting stent. The ramus intermediate branch and OM were chronically occluded. Echo 07/29/20 with LVEF=40-45%, no significant valve disease noted. Possible septic shock during the admission. Beta blocker stopped due to bradycardia. No ARB or Ace-inh added due to hypotension. Coumadin continued with rate controlled atrial flutter. He had chest pain in May 2022 and mildly elevated troponins while being admitted for dialysis  catheter exchange. Cardiac cath 02/17/21 with occlusion of the LAD beyond the stented segment. A drug eluting stent was placed. The intermediate branch and the small obtuse marginal branch were totally occluded, both filling from right to left collaterals. Echo 02/15/21 with LVEF=25-30%, moderate LVH, moderate RV dysfunction. Mild MR. Mild AI.   He is here today for follow up. The patient denies any chest pain, dyspnea, palpitations, lower extremity edema, orthopnea, PND, dizziness, near syncope or syncope. He needs to have revision of his left upper extremity HD graft but this was delayed due to recent PCI and stenting and need for Brilinta.    Primary Care Physician: Hoyt Koch, MD  Past Medical History:  Diagnosis Date  . Allergic rhinitis   . Allergy   . Anemia   . Aortic insufficiency    SBE in 2016 >> pt declined AVR // a. Echo 7/16:  Mod LVH, EF 50-55%, mild to mod AI, severe LAE, PASP 57 mmHg (LV ID end diastolic 98.2 mm);  b. Echo 2/17:  Mod LVH, EF 50-55%, mod AI, MAC, mild MR, mod LAE, mild to mod TR, PASP 63 mmHg // Echo 2/21: normal EF, mild MR, mild AI  . Atrial fibrillation and flutter ([place])    chronic anticoag with Warfarin // bradycardia noted during admit 10/21  . Bilateral carpal tunnel syndrome   . Bradycardia    brady/low BP event during admit in 10/21 >> poor candidate for pacer/EPS due to high risk of infection with ESRD; pacer not recommended  by EP  . CAD (coronary artery disease)    s/p NSTEMI 10/21: cath>> pLAD 99 >> tx with DES; Lat RI 100 and OM1 100 - med Rx  . Cataract    removed both eyes   . Cholecystitis    dx during admx 10/21 >> HIDA neg // exam benign // high risk for surgery >> no surgical intervention recommended.   . Claustrophobia   . COPD (chronic obstructive pulmonary disease) ([place])   . Coronary artery disease   . Diabetes mellitus    Type II  . Diabetic peripheral neuropathy ([place])   . Diabetic retinopathy ([place])   . Discitis of lumbar  region    resolved  . DVT (deep venous thrombosis) ([place]) 10/07/2015   LLE  . Dysrhythmia 07/2020   afib  . Endocarditis 11/28/14  . ESRD (end stage renal disease) on dialysis [facility])    s/p renal transplant in 2009, failed in 2016  . Gait disorder   . Gastroparesis   . GERD (gastroesophageal reflux disease)   . Gout   . Hearing difficulty   . Hemodialysis patient ([place])    tuesday, thursday, saturday   . HFrEF (HF with reduced EF)    Ischemic CM // Echo 10/21: EF 40-45, dist sept and apical AK, mild LVH, normal RVSF, mod MAC  . Hyperlipidemia   . Hypertension   . Incomplete bladder emptying   . Leg pain 02/05/11   with walking  . Morbid obesity ([place])   . Myocardial infarction ([place]) 07/2020  . Obstructive sleep apnea    not using CPAP- lost 100lbs  . Osteomyelitis ([place])   . Posttraumatic stress disorder   . Rotator cuff disorder   . Secondary hyperparathyroidism ([place])   . Sepsis ([place])    Postive blood cultures -   . Sleep apnea    no cpap  . Urethral stricture   . Vitamin D deficiency     Past Surgical History:  Procedure Laterality Date  . ARTERIOVENOUS GRAFT PLACEMENT Bilateral "several"  . AV FISTULA PLACEMENT Bilateral 2015  . AV FISTULA PLACEMENT Right 06/13/2019   Procedure: INSERTION OF ARTERIOVENOUS (AV) GORE-TEX GRAFT RIGHT  ARM;  Surgeon: Elam Dutch, MD;  Location: [place];  Service: Vascular;  Laterality: Right;  . [place] REMOVAL Right 11/08/2020   Procedure: Excision of Foreign Body;  Surgeon: [name], MD;  Location: [place];  Service: Vascular;  Laterality: Right;  . BASCILIC VEIN TRANSPOSITION Right 09/27/2015  . [place] Right 09/27/2015   Procedure: BASILIC VEIN TRANSPOSITION right;  Surgeon: Mal Misty, MD;  Location: [place];  Service: Vascular;  Laterality: Right;  . CARDIAC CATHETERIZATION  07/2020  . CATARACT EXTRACTION W/ INTRAOCULAR LENS  IMPLANT, BILATERAL Bilateral   . CORONARY STENT INTERVENTION N/A 07/27/2020    Procedure: CORONARY STENT INTERVENTION;  Surgeon: Burnell Blanks, MD;  Location: [place] CV LAB;  Service: Cardiovascular;  Laterality: N/A;  . CORONARY STENT INTERVENTION N/A 02/17/2021   Procedure: CORONARY STENT INTERVENTION;  Surgeon: Burnell Blanks, MD;  Location: [place] CV LAB;  Service: Cardiovascular;  Laterality: N/A;  . CYSTOSCOPY W/ INTERNAL URETHROTOMY  09/2014  . EXCHANGE OF A DIALYSIS CATHETER Right 02/14/2021   Procedure: EXCHANGE OF A DIALYSIS CATHETER RIGHT INTERNAL JUGULAR;  Surgeon: Serafina Mitchell, MD;  Location: [place];  Service: Vascular;  Laterality: Right;  . EYE SURGERY Bilateral 2006  . INSERTION OF DIALYSIS CATHETER N/A 11/08/2020   Procedure: INSERTION OF DIALYSIS  CATHETER;  Surgeon: [name], MD;  Location: [place];  Service: Vascular;  Laterality: N/A;  . IR THROMBECTOMY AV FISTULA W/THROMBOLYSIS/PTA INC/SHUNT/IMG RIGHT Right 08/01/2019  . IR US GUIDE VASC ACCESS RIGHT  08/01/2019  . KIDNEY TRANSPLANT  2009  . LEFT HEART CATH AND CORONARY ANGIOGRAPHY N/A 07/27/2020   Procedure: LEFT HEART CATH AND CORONARY ANGIOGRAPHY;  Surgeon: Burnell Blanks, MD;  Location: [place] CV LAB;  Service: Cardiovascular;  Laterality: N/A;  . LEFT HEART CATH AND CORONARY ANGIOGRAPHY N/A 02/17/2021   Procedure: LEFT HEART CATH AND CORONARY ANGIOGRAPHY;  Surgeon: Burnell Blanks, MD;  Location: [place] CV LAB;  Service: Cardiovascular;  Laterality: N/A;  . SIGMOIDOSCOPY    . UPPER EXTREMITY VENOGRAPHY Bilateral 06/02/2019   Procedure: UPPER EXTREMITY VENOGRAPHY;  Surgeon: Elam Dutch, MD;  Location: [place] CV LAB;  Service: Cardiovascular;  Laterality: Bilateral;  . UPPER GASTROINTESTINAL ENDOSCOPY    . VASCULAR ACCESS DEVICE INSERTION Right 11/07/2020   Procedure: RIGHT UPPER EXTREMITY HERO GRAFT;  Surgeon: [name], MD;  Location: [facility];  Service: Vascular;  Laterality: Right;    Current Outpatient Medications   Medication Sig Dispense Refill  . Accu-Chek FastClix Lancets MISC USE TO CHECK BLOOD SUGARS 4 TIMES A DAY. 360 each 4  . allopurinol (ZYLOPRIM) 100 MG tablet Take 1 tablet (100 mg total) by mouth 3 (three) times a week. After dialysis 30 tablet 6  . amoxicillin (AMOXIL) 500 MG capsule Take 500 mg by mouth 3 (three) times daily.    Marland Kitchen atorvastatin (LIPITOR) 80 MG tablet Take 1 tablet (80 mg total) by mouth daily. 90 tablet 3  . AURYXIA 1 GM 210 MG(Fe) tablet Take 630 mg by mouth 3 (three) times daily with meals. Takes 3 tablets with meal and 2 tablet with snacks    . busPIRone (BUSPAR) 5 MG tablet TAKE 1 TABLET TWICE DAILY AS NEEDED 180 tablet 0  . calcium acetate (PHOSLO) 667 MG tablet Take by mouth.    . clonazePAM (KLONOPIN) 0.5 MG tablet Take 1 tablet (0.5 mg total) by mouth 2 (two) times daily as needed for anxiety. 60 tablet 3  . diphenhydrAMINE (BENADRYL) 25 MG tablet Take 1 tablet (25 mg total) by mouth every 8 (eight) hours as needed for itching. 10 tablet 0  . glucose blood (ACCU-CHEK GUIDE) test strip USE AS INSTRUCTED TO CHECK BLOOD SUGAR 3 TIMES DAILY AS NEEDED 300 strip 12  . HUMULIN R U-500 KWIKPEN 500 UNIT/ML kwikpen INJECT 50 UNITS UNDER THE SKIN AT BREAKFAST, 70 UNITS AT LUNCH, AND 60 UNITS AT DINNER. 6 mL 3  . hydrocortisone (ANUSOL-HC) 2.5 % rectal cream Place rectally 4 (four) times daily. 30 g 0  . hydrocortisone (ANUSOL-HC) 25 MG suppository Place 1 suppository (25 mg total) rectally 2 (two) times daily. 180 suppository 0  . insulin glargine, 2 Unit Dial, (TOUJEO MAX SOLOSTAR) 300 UNIT/ML Solostar Pen INJECT 80 UNITS UNDER THE SKIN IN THE MORNING AND 80 UNITS IN THE EVENING 48 mL 1  . Insulin Pen Needle (DROPLET PEN NEEDLES) 32G X 4 MM MISC USE TO INJECT FIVE TIMES DAILY AS DIRECTED 500 each 1  . Insulin Pen Needle 32G X 8 MM MISC Use with insulin x5 times a day 500 each 1  . Methoxy PEG-Epoetin Beta (MIRCERA IJ) Mircera    . metoprolol succinate (TOPROL-XL) 25 MG 24 hr  tablet Take 0.5 tablets (12.5 mg total) by mouth at bedtime. 30 tablet 0  .  midodrine (PROAMATINE) 10 MG tablet Take 10 mg by mouth 3 (three) times daily.    . mupirocin ointment (BACTROBAN) 2 % Apply 1 application topically 2 (two) times daily. 30 g 2  . omeprazole (PRILOSEC OTC) 20 MG tablet Take by mouth.    . pantoprazole (PROTONIX) 40 MG tablet Take 1 tablet (40 mg total) by mouth daily. 90 tablet 3  . rosuvastatin (CRESTOR) 20 MG tablet Take by mouth.    . senna-docusate (SENOKOT-S) 8.6-50 MG tablet Take 2 tablets by mouth 2 (two) times daily.    . sevelamer carbonate (RENVELA) 800 MG tablet Take 2,400-3,200 mg by mouth in the morning, at noon, in the evening, and at bedtime. Takes 4 tablets with meals and 2 tablets with snacks    . silver sulfADIAZINE (SILVADENE) 1 % cream Apply 1 application topically daily. 50 g 0  . sodium zirconium cyclosilicate (LOKELMA) 10 g PACK packet Take by mouth.    . VELTASSA 8.4 g packet Take 8.4 g by mouth daily.     Marland Kitchen warfarin (COUMADIN) 5 MG tablet TAKE 1 TABLET EVERY DAY OR AS DIRECTED BY COUMADIN CLINIC ADMIN INSTRUCTIONS (Patient taking differently: Take 5 mg by mouth daily.) 100 tablet 1  . witch hazel-glycerin (TUCKS) pad Apply topically as needed for itching. 40 each 12  . ticagrelor (BRILINTA) 90 MG TABS tablet Take 1 tablet (90 mg total) by mouth 2 (two) times daily. 180 tablet 3   No current facility-administered medications for this visit.    Allergies  Allergen Reactions  . Shellfish-Derived Products Anaphylaxis  . Ace Inhibitors Cough  . Hydrocodone Other (See Comments)    Hallucinations  . Pork-Derived Products Other (See Comments)    Entry determined to be clinically insignificant: OK TO GIVE HEPARIN (has tolerated well in the past)  Patient had allergic episode in 1989 after he ate pork at a fair. He developed fever and rash and went to the ED, but he did not have breathing issues. Received heparin in the past. Patient ok with removing  pork allergy and listing as intolerance instead.    Social History   Socioeconomic History  . Marital status: Divorced    Spouse name: Not on file  . Number of children: 0  . Years of education: College   . Highest education level: Bachelor's degree (e.g., BA, AB, BS)  Occupational History    Employer: UNEMPLOYED  Tobacco Use  . Smoking status: Never Smoker  . Smokeless tobacco: Never Used  Vaping Use  . Vaping Use: Never used  Substance and Sexual Activity  . Alcohol use: No    Alcohol/week: 0.0 standard drinks  . Drug use: No  . Sexual activity: Not Currently  Other Topics Concern  . Not on file  Social History Narrative   Patient is Divorced.   Patient does not have any children.   Patient is right-handed   Patient is currently in college working on his Ph.D.      Social Determinants of Health   Financial Resource Strain: Not on file  Food Insecurity: Not on file  Transportation Needs: Not on file  Physical Activity: Not on file  Stress: Not on file  Social Connections: Not on file  Intimate Partner Violence: Not on file    Family History  Problem Relation Age of Onset  . Hypertension Mother   . Diabetes Mother   . Diabetes Father   . Diabetes Brother   . Colon cancer Neg Hx   . Colon  polyps Neg Hx   . Esophageal cancer Neg Hx   . Rectal cancer Neg Hx   . Stomach cancer Neg Hx     Review of Systems:  As stated in the HPI and otherwise negative.   BP 122/70   Pulse 73   Ht _0  (1.778 m)   Wt 288 lb (130.6 kg)   SpO2 99%   BMI 41.32 kg/m   Physical Examination:  General: Well developed, well nourished, NAD  HEENT: OP clear, mucus membranes moist  SKIN: warm, dry. No rashes. Neuro: No focal deficits  Musculoskeletal: Muscle strength 5/5 all ext  Psychiatric: Mood and affect normal  Neck: No JVD, no carotid bruits, no thyromegaly, no lymphadenopathy.  Lungs:Clear bilaterally, no wheezes, rhonci, crackles Cardiovascular: Regular rate and  rhythm. No murmurs, gallops or rubs. Abdomen:Soft. Bowel sounds present. Non-tender.  Extremities: No lower extremity edema. Pulses are 2 + in the bilateral DP/PT.  Echo 02/15/21:  1. Left ventricular ejection fraction, by estimation, is 25 to 30%. The  left ventricle has severely decreased function. The left ventricle  demonstrates regional wall motion abnormalities. There is global  hypokinesis with disproportionately severe  hypokinesis of the left ventricular, mid-apical anteroseptal wall,  anterior wall, apical segment and inferior wall. There is moderate  concentric left ventricular hypertrophy. Left ventricular diastolic  function could not be evaluated.  2. Right ventricular systolic function is moderately reduced. The right  ventricular size is moderately enlarged. There is moderately elevated  pulmonary artery systolic pressure.  3. Left atrial size was severely dilated.  4. A small pericardial effusion is present. The pericardial effusion is  circumferential. There is no evidence of cardiac tamponade.  5. The mitral valve is degenerative. Mild mitral valve regurgitation.  Mild mitral stenosis. The mean mitral valve gradient is 4.8 mmHg. Severe  mitral annular calcification.  6. Tricuspid valve regurgitation is moderate.  7. The aortic valve is tricuspid. There is mild calcification of the  aortic valve. There is mild thickening of the aortic valve. Aortic valve  regurgitation is mild. Mild aortic valve sclerosis is present, with no  evidence of aortic valve stenosis.  8. The inferior vena cava is dilated in size with <50% respiratory  variability, suggesting right atrial pressure of 15 mmHg.    EKG:  EKG is not  ordered today. The ekg ordered today demonstrates   Recent Labs: 01/14/2021: ALT 18 02/15/2021: Magnesium 2.2 02/18/2021: Creatinine, Ser 8.81; Platelets 243 02/21/2021: BUN 15; Hemoglobin 11.4; Potassium 4.7; Sodium 143    Wt Readings from Last 3  Encounters:  03/17/21 288 lb (130.6 kg)  02/26/21 297 lb (134.7 kg)  02/18/21 297 lb 13.5 oz (135.1 kg)     Other studies Reviewed: Additional studies/ records that were reviewed today include: . Review of the above records demonstrates:    Assessment and Plan:   1. Aortic insufficiency: Mild AI by echo 02/15/21.      2. Hypertensive heart disease with heart failure : BP controlled on Toprol.    3. Morbid obesity: Wt loss is recommended  4. Chronic diastolic CHF: Volume controlled with dialysis.   5. HLD: LDL 43 in August 2021. Continue statin  6. End stage renal disease: He is on HD.  7.  Atrial flutter, paroxysmal: Sinus today on exam. Continue coumadin. (He is also on this because of prior clotting issues with HD access).   8. CAD without angina: No chest pain. Will continue Brilinta until at least mid August 2022. He  can hold his Brilinta in mid August 2022 if needed for revision of his HD graft. Continue statin.   9. Ischemic cardiomyopathy: LVEF=30% prior to LAD PCI. Repeat echo now. Continue Toprol. No ARB or Ace-inh given soft BP with dialysis.    Current medicines are reviewed at length with the patient today.  The patient does not have concerns regarding medicines.  The following changes have been made:  no change  Labs/ tests ordered today include:   Orders Placed This Encounter  Procedures  . ECHOCARDIOGRAM COMPLETE     Disposition:   FU with me in months   Signed, Lauree Chandler, MD 03/17/2021 4:24 PM    [facility] [address] [address], [place], [place]  [address] Phone: [phone]; Fax: [phone]

## 2021-03-17 NOTE — Patient Instructions (Addendum)
Medication Instructions:  No changes *If you need a refill on your cardiac medications before your next appointment, please call your pharmacy*   Lab Work: none If you have labs (blood work) drawn today and your tests are completely normal, you will receive your results only by: Marland Kitchen MyChart Message (if you have MyChart) OR . A paper copy in the mail If you have any lab test that is abnormal or we need to change your treatment, we will call you to review the results.   Testing/Procedures: Your physician has requested that you have an echocardiogram. Echocardiography is a painless test that uses sound waves to create images of your heart. It provides your doctor with information about the size and shape of your heart and how well your heart's chambers and valves are working. This procedure takes approximately one hour. There are no restrictions for this procedure.   Follow-Up: At [facility], you and your health needs are our priority.  As part of our continuing mission to provide you with exceptional heart care, we have created designated Provider Care Teams.  These Care Teams include your primary Cardiologist (physician) and Advanced Practice Providers (APPs -  Physician Assistants and Nurse Practitioners) who all work together to provide you with the care you need, when you need it.   Your next appointment:   12 month(s)  The format for your next appointment:   In Person  Provider:   You may see Lauree Chandler, MD or one of the following Advanced Practice Providers on your designated Care Team:    Melina Copa, PA-C  Ermalinda Barrios, PA-C   Other Instructions [name] [name] can be held after mid August 2022 if needed for surgical procedure.

## 2021-03-18 ENCOUNTER — Telehealth: Payer: Self-pay | Admitting: Internal Medicine

## 2021-03-18 ENCOUNTER — Telehealth (HOSPITAL_COMMUNITY): Payer: Self-pay | Admitting: *Deleted

## 2021-03-18 ENCOUNTER — Ambulatory Visit (INDEPENDENT_AMBULATORY_CARE_PROVIDER_SITE_OTHER): Payer: Medicare HMO | Admitting: General Practice

## 2021-03-18 DIAGNOSIS — Z7901 Long term (current) use of anticoagulants: Secondary | ICD-10-CM | POA: Diagnosis not present

## 2021-03-18 DIAGNOSIS — N2581 Secondary hyperparathyroidism of renal origin: Secondary | ICD-10-CM | POA: Diagnosis not present

## 2021-03-18 DIAGNOSIS — N186 End stage renal disease: Secondary | ICD-10-CM | POA: Diagnosis not present

## 2021-03-18 DIAGNOSIS — Z992 Dependence on renal dialysis: Secondary | ICD-10-CM | POA: Diagnosis not present

## 2021-03-18 LAB — POCT INR: INR: 1.4 — AB (ref 2.0–3.0)

## 2021-03-18 NOTE — Progress Notes (Signed)
Medical treatment/procedure(s) were performed by non-physician practitioner and as supervising physician I was immediately available for consultation/collaboration. I agree with above. Maddoxx Burkitt A Ester Mabe, MD  

## 2021-03-18 NOTE — Telephone Encounter (Signed)
Pt returned my call from message left on his voicemail.  Able to determine the information I needed in order to seek clearance from his nephrologist  for him to participate in Cardiac rehab when able.  In talking with this pt, he is quite limited in his mobility.  He uses a scooter to get around and walks very little.  Explained to him the expectations for an independent group exercise setting.  Pt stated that if he had a rolling walker he could possible be able to move about.  Pt also stated that he was told he needed a rolling walker and home PT when he was discharged from the hospital on 5/10 but this did not happen.  Reviewed progress notes and see where the PT did recommend rolling walker and home PT but this was not ordered per the discharge summary.  Reached out to his PCP - Dr. Sharlet Salina and message left for office staff. Feel pt would benefit from home PT prior to participating in CR and rolling walker for patient safety. Cherre Huger, BSN Cardiac and Training and development officer

## 2021-03-18 NOTE — Telephone Encounter (Signed)
See below

## 2021-03-18 NOTE — Telephone Encounter (Signed)
Pt completed his hospital follow up appt with Dr. Angelena Form.  Called pt to determine days of the week he is doing hemodialysis, which dialysis center he uses and who is his nephrologist.  Left message requesting a call back. Cherre Huger, BSN Cardiac and Training and development officer

## 2021-03-18 NOTE — Patient Instructions (Signed)
Pre visit review using our clinic review tool, if applicable. No additional management support is needed unless otherwise documented below in the visit note.  Please take 1 1/2 tablets today, tomorrow and Thursday and then continue to take 1 (5 mg) tablet daily.  Dialysis patient. Re-check weekly.  Fax# has been changed to [phone]

## 2021-03-18 NOTE — Telephone Encounter (Signed)
Need Order for a rolling walker with five inch wheels Recommended by Physical Therapy on may 10th Necessary for safety issues (balance issues)

## 2021-03-19 ENCOUNTER — Other Ambulatory Visit: Payer: Self-pay | Admitting: Internal Medicine

## 2021-03-19 NOTE — Telephone Encounter (Signed)
He may need to check with insurance company. Since they have covered an electric scooter recently part of the documentation for the scooter is that a walker or cane would not help his problems so they likely will not pay for a walker. If they will I am happy to order otherwise he will most likely have to pay out of pocket and does not need prescription for that.

## 2021-03-19 NOTE — Telephone Encounter (Signed)
Unable to get in contact with the patient . LVM asking him to return our call here at the office. Office number was provided. Mychart message sent as well

## 2021-03-20 ENCOUNTER — Telehealth: Payer: Self-pay

## 2021-03-20 DIAGNOSIS — N2581 Secondary hyperparathyroidism of renal origin: Secondary | ICD-10-CM | POA: Diagnosis not present

## 2021-03-20 DIAGNOSIS — N186 End stage renal disease: Secondary | ICD-10-CM | POA: Diagnosis not present

## 2021-03-20 DIAGNOSIS — Z992 Dependence on renal dialysis: Secondary | ICD-10-CM | POA: Diagnosis not present

## 2021-03-20 NOTE — Telephone Encounter (Signed)
TC placed by palliative care volunteer, no answer

## 2021-03-21 NOTE — Progress Notes (Signed)
Subjective: 55 year old male presents the office today for evaluation of calluses, wounds on the right fourth and fifth toes.  He states they are getting better although slowly.  He also states that he has noticed some blisters on the toes on the left foot is not sure how these all started.  Denies any swelling redness or drainage.  Currently denies any fevers, chills, nausea, vomiting.  He has not yet seen vascular surgery.  Objective: AAO x3, NAD DP/PT pulses palpable but decreased bilaterally, CRT less than 3  Large areas of hyperkeratotic tissue and dorsal aspect of the right fourth and fifth digits.  Some of the tissue is loose and coming off today and there is underlying granulation tissue present.  There is no exposed bone or tendon today.  There is no significant edema, erythema the right side.  On the left side superficial blisters present on the second third toes without any drainage or pus. No pain with calf compression, swelling, warmth, erythema          Assessment: Preulcerative calluses, wounds right fourth and fifth digits; blisters left foot  Plan: -All treatment options discussed with the patient including all alternatives, risks, complications.  -Sharply debrided the loose tissue today with a tissue nipper as well as a 312 with scalpel down to underlying healthy tissue.  There was minimal blood loss.  Hemostasis achieved through manual compression.  Continue with Silvadene dressing changes daily.  Continue offloading. -Antibiotic ointment on the areas on the left foot daily. -Discussed daily foot inspection. -Follow-up with vascular surgery.  Trula Slade DPM

## 2021-03-22 DIAGNOSIS — N2581 Secondary hyperparathyroidism of renal origin: Secondary | ICD-10-CM | POA: Diagnosis not present

## 2021-03-22 DIAGNOSIS — Z992 Dependence on renal dialysis: Secondary | ICD-10-CM | POA: Diagnosis not present

## 2021-03-22 DIAGNOSIS — N186 End stage renal disease: Secondary | ICD-10-CM | POA: Diagnosis not present

## 2021-03-24 ENCOUNTER — Other Ambulatory Visit: Payer: Self-pay | Admitting: Internal Medicine

## 2021-03-25 DIAGNOSIS — N2581 Secondary hyperparathyroidism of renal origin: Secondary | ICD-10-CM | POA: Diagnosis not present

## 2021-03-25 DIAGNOSIS — N186 End stage renal disease: Secondary | ICD-10-CM | POA: Diagnosis not present

## 2021-03-25 DIAGNOSIS — Z992 Dependence on renal dialysis: Secondary | ICD-10-CM | POA: Diagnosis not present

## 2021-03-26 MED ORDER — CLONAZEPAM 0.5 MG PO TABS
0.5000 mg | ORAL_TABLET | Freq: Two times a day (BID) | ORAL | 3 refills | Status: DC | PRN
Start: 2021-03-26 — End: 2021-03-27

## 2021-03-26 NOTE — Telephone Encounter (Signed)
Pt states he no longer takes Buspirone, so refill is not appropriate. Pt states he takes clonazepam as needed for anxiety.  This medication was refilled by PCP on 02/26/21, however, pt states he needs it refilled to [facility].  Last RF per controlled substance database is 02/17/21.  Will refill to [facility] per pt request.

## 2021-03-27 ENCOUNTER — Telehealth: Payer: Self-pay | Admitting: Internal Medicine

## 2021-03-27 DIAGNOSIS — Z992 Dependence on renal dialysis: Secondary | ICD-10-CM | POA: Diagnosis not present

## 2021-03-27 DIAGNOSIS — N186 End stage renal disease: Secondary | ICD-10-CM | POA: Diagnosis not present

## 2021-03-27 DIAGNOSIS — N2581 Secondary hyperparathyroidism of renal origin: Secondary | ICD-10-CM | POA: Diagnosis not present

## 2021-03-27 MED ORDER — CLONAZEPAM 0.5 MG PO TABS: 0.5000 mg | ORAL_TABLET | Freq: Two times a day (BID) | ORAL | 3 refills | Status: DC | PRN

## 2021-03-27 NOTE — Telephone Encounter (Signed)
    Please call patient to discuss hydrocortisone (ANUSOL-HC) 25 MG suppository

## 2021-03-27 NOTE — Telephone Encounter (Signed)
I typically do not send 1 month supply prescriptions to mail order. This was filled for 4 month supply to his [facility] in mid May and should not need to be refilled. Is he unable to fill it at his pharmacy?

## 2021-03-27 NOTE — Telephone Encounter (Signed)
[facility] on file called.  Requested Clonazepam be canceled; tech confirms that the prescription was never filled & has canceled prescription.

## 2021-03-27 NOTE — Telephone Encounter (Signed)
Sent in, please call [facility] and cancel rx from there.

## 2021-03-28 LAB — POCT INR: INR: 3.1 — AB (ref 2.0–3.0)

## 2021-03-29 DIAGNOSIS — Z992 Dependence on renal dialysis: Secondary | ICD-10-CM | POA: Diagnosis not present

## 2021-03-29 DIAGNOSIS — N186 End stage renal disease: Secondary | ICD-10-CM | POA: Diagnosis not present

## 2021-03-29 DIAGNOSIS — N2581 Secondary hyperparathyroidism of renal origin: Secondary | ICD-10-CM | POA: Diagnosis not present

## 2021-03-31 ENCOUNTER — Other Ambulatory Visit: Payer: Self-pay

## 2021-03-31 ENCOUNTER — Encounter: Payer: Self-pay | Admitting: Podiatry

## 2021-03-31 ENCOUNTER — Ambulatory Visit (INDEPENDENT_AMBULATORY_CARE_PROVIDER_SITE_OTHER): Payer: Medicare HMO | Admitting: Podiatry

## 2021-03-31 DIAGNOSIS — L97512 Non-pressure chronic ulcer of other part of right foot with fat layer exposed: Secondary | ICD-10-CM

## 2021-03-31 DIAGNOSIS — E1122 Type 2 diabetes mellitus with diabetic chronic kidney disease: Secondary | ICD-10-CM | POA: Diagnosis not present

## 2021-03-31 DIAGNOSIS — E1149 Type 2 diabetes mellitus with other diabetic neurological complication: Secondary | ICD-10-CM | POA: Diagnosis not present

## 2021-03-31 MED ORDER — HYDROCORTISONE ACETATE 25 MG RE SUPP: 25.0000 mg | Freq: Two times a day (BID) | RECTAL | 0 refills | Status: DC

## 2021-03-31 NOTE — Telephone Encounter (Signed)
Re-faxed today.

## 2021-03-31 NOTE — Telephone Encounter (Signed)
Left message for patient to return call to clinic.  If he calls back please see what it is that he is needing regarding the medication.  Thanks

## 2021-03-31 NOTE — Telephone Encounter (Signed)
Follow up message    Please resend suppository to [facility]. Patient states he never  received the last request from [facility]. Patient also states he is unsure if medication needs authorization

## 2021-04-01 ENCOUNTER — Ambulatory Visit (INDEPENDENT_AMBULATORY_CARE_PROVIDER_SITE_OTHER): Payer: Medicare HMO | Admitting: General Practice

## 2021-04-01 ENCOUNTER — Ambulatory Visit: Payer: Medicare HMO | Admitting: Vascular Surgery

## 2021-04-01 DIAGNOSIS — Z992 Dependence on renal dialysis: Secondary | ICD-10-CM | POA: Diagnosis not present

## 2021-04-01 DIAGNOSIS — Z7901 Long term (current) use of anticoagulants: Secondary | ICD-10-CM

## 2021-04-01 DIAGNOSIS — N186 End stage renal disease: Secondary | ICD-10-CM | POA: Diagnosis not present

## 2021-04-01 DIAGNOSIS — N2581 Secondary hyperparathyroidism of renal origin: Secondary | ICD-10-CM | POA: Diagnosis not present

## 2021-04-01 NOTE — Progress Notes (Signed)
Medical treatment/procedure(s) were performed by non-physician practitioner and as supervising physician I was immediately available for consultation/collaboration. I agree with above. Jahmiyah Dullea A Sanjiv Castorena, MD  

## 2021-04-01 NOTE — Patient Instructions (Signed)
Pre visit review using our clinic review tool, if applicable. No additional management support is needed unless otherwise documented below in the visit note.  Continue to take 1 tablet (5 mg) daily.  Patient verbalized understanding.  Re-check in 2 to 3 weeks.  Fax# has been changed to [phone]

## 2021-04-02 ENCOUNTER — Telehealth: Payer: Self-pay | Admitting: Podiatry

## 2021-04-02 NOTE — Telephone Encounter (Signed)
Patient is requesting a prescription for medical supplies. Please advise

## 2021-04-03 DIAGNOSIS — N186 End stage renal disease: Secondary | ICD-10-CM | POA: Diagnosis not present

## 2021-04-03 DIAGNOSIS — Z992 Dependence on renal dialysis: Secondary | ICD-10-CM | POA: Diagnosis not present

## 2021-04-03 DIAGNOSIS — N2581 Secondary hyperparathyroidism of renal origin: Secondary | ICD-10-CM | POA: Diagnosis not present

## 2021-04-03 NOTE — Progress Notes (Signed)
Subjective: 55 year old male presents the office today for evaluation of calluses, wounds on the right fourth and fifth toes.  He believes that the toes are doing better and healing.  Still having some scabs on the left foot he reports no recent injury.  No drainage or pus from the areas.  Denies any fevers, chills, nausea, vomiting.  Is scheduled to see vascular surgery next week.  Objective: AAO x3, NAD DP/PT pulses palpable but decreased bilaterally, CRT less than 3  Large areas of hyperkeratotic tissue and dorsal aspect of the right fourth and fifth digits.  Upon debridement there is granulation tissue present with some fibrotic tissue underneath calluses.  There is no probing to bone or tunneling.  There is no surrounding erythema, ascending cellulitis.  No fluctuation crepitation.  No malodor.  On the distal aspect of the left second toe is old hemorrhagic blister is now scabbed over.  There is no edema, erythema or signs of infection. No pain with calf compression, swelling, warmth, erythema   Assessment: Preulcerative calluses, wounds right fourth and fifth digits; blisters left foot  Plan: -All treatment options discussed with the patient including all alternatives, risks, complications.  -Sharp debrided loose tissue on the right fourth and fifth toe cellulitis and tissue nipper as well as a 312 with scalpel down to healthy tissue.  There was no significant blood loss.  Plan to continue with daily dressing changes.   -Scabs are formed on the left foot.  We will continue antibiotic ointment dressing changes to these areas as well. -Discussed daily foot inspection. -Follow-up with vascular surgery.  Trula Slade DPM

## 2021-04-04 NOTE — Telephone Encounter (Signed)
Called and spoke with Devonta from [facility] wound [facility] complete and the supplies are going out today and I left a message for the patient. Lattie Haw

## 2021-04-04 NOTE — Telephone Encounter (Signed)
Patient calling to check the status of his wound care supplies. Patient called [employer] and they stated they had not received anything regarding his wound care. Please advise.

## 2021-04-05 DIAGNOSIS — N2581 Secondary hyperparathyroidism of renal origin: Secondary | ICD-10-CM | POA: Diagnosis not present

## 2021-04-05 DIAGNOSIS — Z992 Dependence on renal dialysis: Secondary | ICD-10-CM | POA: Diagnosis not present

## 2021-04-05 DIAGNOSIS — N186 End stage renal disease: Secondary | ICD-10-CM | POA: Diagnosis not present

## 2021-04-07 DIAGNOSIS — N2581 Secondary hyperparathyroidism of renal origin: Secondary | ICD-10-CM | POA: Diagnosis not present

## 2021-04-07 DIAGNOSIS — R6889 Other general symptoms and signs: Secondary | ICD-10-CM | POA: Diagnosis not present

## 2021-04-07 DIAGNOSIS — N186 End stage renal disease: Secondary | ICD-10-CM | POA: Diagnosis not present

## 2021-04-07 DIAGNOSIS — Z992 Dependence on renal dialysis: Secondary | ICD-10-CM | POA: Diagnosis not present

## 2021-04-08 ENCOUNTER — Encounter: Payer: Self-pay | Admitting: Vascular Surgery

## 2021-04-08 ENCOUNTER — Other Ambulatory Visit: Payer: Self-pay

## 2021-04-08 ENCOUNTER — Telehealth: Payer: Self-pay | Admitting: Podiatry

## 2021-04-08 ENCOUNTER — Ambulatory Visit (INDEPENDENT_AMBULATORY_CARE_PROVIDER_SITE_OTHER): Payer: Medicare HMO | Admitting: Vascular Surgery

## 2021-04-08 VITALS — BP 136/68 | HR 84 | Temp 98.0°F | Resp 20 | Ht 70.0 in | Wt 288.0 lb

## 2021-04-08 DIAGNOSIS — I739 Peripheral vascular disease, unspecified: Secondary | ICD-10-CM | POA: Diagnosis not present

## 2021-04-08 DIAGNOSIS — R6889 Other general symptoms and signs: Secondary | ICD-10-CM | POA: Diagnosis not present

## 2021-04-08 NOTE — Telephone Encounter (Signed)
Pt called stating he has not yet received his supplies and would like an update on the status of his order. Please advise.

## 2021-04-08 NOTE — Progress Notes (Signed)
[facility] [place]  ASSESSMENT / PLAN: 55 y.o. male with ulceration about the toes of bilateral lower extremities. He is not a candidate for revascularization because of his non-ambulatory status. Fortunately, his absolute toe pressures predict wound healing with local care alone. Recommend continuing local care as able. Unfortunately, his only option if these wounds progress would be major amputation. He can follow up with me as needed.  CHIEF COMPLAINT: toe ulcers  HISTORY OF PRESENT ILLNESS: [name] [name] is a 55 y.o. male well known to me for difficult dialysis access.  He is catheter dependent.  He has no other options for dialysis access.  He is referred to clinic evaluation of ulceration about his bilateral toes.  He has been under the care of Dr. Earleen Newport who has been conducting local wound care.  An ankle-brachial index was performed which showed noncompressible tibial arteries bilaterally.  Fortunately his toe pressures were excellent and well above the healing threshold in a diabetic (greater than 50).  He is minimally ambulatory, getting around long distances in a motorized wheelchair.  Past Medical History:  Diagnosis Date   Allergic rhinitis    Allergy    Anemia    Aortic insufficiency    SBE in 2016 >> pt declined AVR // a. Echo 7/16:  Mod LVH, EF 50-55%, mild to mod AI, severe LAE, PASP 57 mmHg (LV ID end diastolic 93.2 mm);  b. Echo 2/17:  Mod LVH, EF 50-55%, mod AI, MAC, mild MR, mod LAE, mild to mod TR, PASP 63 mmHg // Echo 2/21: normal EF, mild MR, mild AI   Atrial fibrillation and flutter (HCC)    chronic anticoag with Warfarin // bradycardia noted during admit 10/21   Bilateral carpal tunnel syndrome    Bradycardia    brady/low BP event during admit in 10/21 >> poor candidate for pacer/EPS due to high risk of infection with ESRD; pacer not recommended by EP   CAD (coronary artery disease)    s/p NSTEMI 10/21: cath>> pLAD 99 >> tx with DES; Lat RI  100 and OM1 100 - med Rx   Cataract    removed both eyes    Cholecystitis    dx during admx 10/21 >> HIDA neg // exam benign // high risk for surgery >> no surgical intervention recommended.    Claustrophobia    COPD (chronic obstructive pulmonary disease) (HCC)    Coronary artery disease    Diabetes mellitus    Type II   Diabetic peripheral neuropathy (HCC)    Diabetic retinopathy ([place])    Discitis of lumbar region    resolved   DVT (deep venous thrombosis) ([place]) 10/07/2015   LLE   Dysrhythmia 07/2020   afib   Endocarditis 11/28/14   ESRD (end stage renal disease) on dialysis [facility])    s/p renal transplant in 2009, failed in 2016   Gait disorder    Gastroparesis    GERD (gastroesophageal reflux disease)    Gout    Hearing difficulty    Hemodialysis patient ([place])    tuesday, thursday, saturday    HFrEF (HF with reduced EF)    Ischemic CM // Echo 10/21: EF 40-45, dist sept and apical AK, mild LVH, normal RVSF, mod MAC   Hyperlipidemia    Hypertension    Incomplete bladder emptying    Leg pain 02/05/11   with walking   Morbid obesity ([place])    Myocardial infarction ([place]) 07/2020   Obstructive sleep apnea  not using CPAP- lost 100lbs   Osteomyelitis (HCC)    Posttraumatic stress disorder    Rotator cuff disorder    Secondary hyperparathyroidism ([place])    Sepsis ([place])    Postive blood cultures -    Sleep apnea    no cpap   Urethral stricture    Vitamin D deficiency     Past Surgical History:  Procedure Laterality Date   ARTERIOVENOUS GRAFT PLACEMENT Bilateral "several"   AV FISTULA PLACEMENT Bilateral 2015   AV FISTULA PLACEMENT Right 06/13/2019   Procedure: INSERTION OF ARTERIOVENOUS (AV) GORE-TEX GRAFT RIGHT  ARM;  Surgeon: Elam Dutch, MD;  Location: [facility];  Service: Vascular;  Laterality: Right;   [place] Right 11/08/2020   Procedure: Excision of Foreign Body;  Surgeon: [name], MD;  Location: [facility];  Service: Vascular;  Laterality: Right;    [place] Right 71/69/6789   BASCILIC VEIN TRANSPOSITION Right 09/27/2015   Procedure: BASILIC VEIN TRANSPOSITION right;  Surgeon: Mal Misty, MD;  Location: [facility] [place];  Service: Vascular;  Laterality: Right;   CARDIAC CATHETERIZATION  07/2020   CATARACT EXTRACTION W/ INTRAOCULAR LENS  IMPLANT, BILATERAL Bilateral    CORONARY STENT INTERVENTION N/A 07/27/2020   Procedure: CORONARY STENT INTERVENTION;  Surgeon: Burnell Blanks, MD;  Location: [facility];  Service: Cardiovascular;  Laterality: N/A;   CORONARY STENT INTERVENTION N/A 02/17/2021   Procedure: CORONARY STENT INTERVENTION;  Surgeon: Burnell Blanks, MD;  Location: [facility] [facility];  Service: Cardiovascular;  Laterality: N/A;   CYSTOSCOPY W/ INTERNAL URETHROTOMY  09/2014   EXCHANGE OF A DIALYSIS CATHETER Right 02/14/2021   Procedure: EXCHANGE OF A DIALYSIS CATHETER RIGHT INTERNAL JUGULAR;  Surgeon: Serafina Mitchell, MD;  Location: [facility];  Service: Vascular;  Laterality: Right;   EYE SURGERY Bilateral 2006   INSERTION OF DIALYSIS CATHETER N/A 11/08/2020   Procedure: INSERTION OF DIALYSIS CATHETER;  Surgeon: [name], MD;  Location: [place] [facility];  Service: Vascular;  Laterality: N/A;   IR THROMBECTOMY AV FISTULA W/THROMBOLYSIS/PTA INC/SHUNT/IMG RIGHT Right 08/01/2019   IR US GUIDE VASC ACCESS RIGHT  08/01/2019   KIDNEY TRANSPLANT  2009   LEFT HEART CATH AND CORONARY ANGIOGRAPHY N/A 07/27/2020   Procedure: LEFT HEART CATH AND CORONARY ANGIOGRAPHY;  Surgeon: Burnell Blanks, MD;  Location: [facility];  Service: Cardiovascular;  Laterality: N/A;   LEFT HEART CATH AND CORONARY ANGIOGRAPHY N/A 02/17/2021   Procedure: LEFT HEART CATH AND CORONARY ANGIOGRAPHY;  Surgeon: Burnell Blanks, MD;  Location: [facility];  Service: Cardiovascular;  Laterality: N/A;   SIGMOIDOSCOPY     UPPER EXTREMITY VENOGRAPHY Bilateral 06/02/2019   Procedure: UPPER EXTREMITY VENOGRAPHY;  Surgeon:  Elam Dutch, MD;  Location: [place] [facility];  Service: Cardiovascular;  Laterality: Bilateral;   UPPER GASTROINTESTINAL ENDOSCOPY     VASCULAR ACCESS DEVICE INSERTION Right 11/07/2020   Procedure: RIGHT UPPER EXTREMITY HERO GRAFT;  Surgeon: [name], MD;  Location: [facility];  Service: Vascular;  Laterality: Right;    Family History  Problem Relation Age of Onset   Hypertension Mother    Diabetes Mother    Diabetes Father    Diabetes Brother    Colon cancer Neg Hx    Colon polyps Neg Hx    Esophageal cancer Neg Hx    Rectal cancer Neg Hx    Stomach cancer Neg Hx     Social History   Socioeconomic History  Marital status: Divorced    Spouse name: Not on file   Number of children: 0   Years of education: College    Highest education level: Bachelor's degree (e.g., BA, AB, BS)  Occupational History    Employer: UNEMPLOYED  Tobacco Use   Smoking status: Never   Smokeless tobacco: Never  Vaping Use   Vaping Use: Never used  Substance and Sexual Activity   Alcohol use: No    Alcohol/week: 0.0 standard drinks   Drug use: No   Sexual activity: Not Currently  Other Topics Concern   Not on file  Social History Narrative   Patient is Divorced.   Patient does not have any children.   Patient is right-handed   Patient is currently in college working on his Ph.D.      Social Determinants of Health   Financial Resource Strain: Not on file  Food Insecurity: Not on file  Transportation Needs: Not on file  Physical Activity: Not on file  Stress: Not on file  Social Connections: Not on file  Intimate Partner Violence: Not on file    Allergies  Allergen Reactions   Shellfish-Derived Products Anaphylaxis   Ace Inhibitors Cough   Hydrocodone Other (See Comments)    Hallucinations   Pork-Derived Products Other (See Comments)    Entry determined to be clinically insignificant: OK TO GIVE HEPARIN (has tolerated well in the past)  Patient had allergic episode in  1989 after he ate pork at a fair. He developed fever and rash and went to the ED, but he did not have breathing issues. Received heparin in the past. Patient ok with removing pork allergy and listing as intolerance instead.    Current Outpatient Medications  Medication Sig Dispense Refill   Accu-Chek FastClix Lancets MISC USE TO CHECK BLOOD SUGARS 4 TIMES A DAY. 360 each 4   allopurinol (ZYLOPRIM) 100 MG tablet Take 1 tablet (100 mg total) by mouth 3 (three) times a week. After dialysis 30 tablet 6   amoxicillin (AMOXIL) 500 MG capsule Take 500 mg by mouth 3 (three) times daily.     atorvastatin (LIPITOR) 80 MG tablet Take 1 tablet (80 mg total) by mouth daily. 90 tablet 3   AURYXIA 1 GM 210 MG(Fe) tablet Take 630 mg by mouth 3 (three) times daily with meals. Takes 3 tablets with meal and 2 tablet with snacks     busPIRone (BUSPAR) 5 MG tablet TAKE 1 TABLET TWICE DAILY AS NEEDED 180 tablet 0   calcium acetate (PHOSLO) 667 MG tablet Take by mouth.     clonazePAM (KLONOPIN) 0.5 MG tablet Take 1 tablet (0.5 mg total) by mouth 2 (two) times daily as needed for anxiety. 60 tablet 3   diphenhydrAMINE (BENADRYL) 25 MG tablet Take 1 tablet (25 mg total) by mouth every 8 (eight) hours as needed for itching. 10 tablet 0   glucose blood (ACCU-CHEK GUIDE) test strip USE AS INSTRUCTED TO CHECK BLOOD SUGAR 3 TIMES DAILY AS NEEDED 300 strip 12   HUMULIN R U-500 KWIKPEN 500 UNIT/ML kwikpen INJECT 50 UNITS UNDER THE SKIN AT BREAKFAST, 70 UNITS AT LUNCH, AND 60 UNITS AT DINNER. 6 mL 3   hydrocortisone (ANUSOL-HC) 2.5 % rectal cream Place rectally 4 (four) times daily. 30 g 0   hydrocortisone (ANUSOL-HC) 25 MG suppository Place 1 suppository (25 mg total) rectally 2 (two) times daily. 180 suppository 0   insulin glargine, 2 Unit Dial, (TOUJEO MAX SOLOSTAR) 300 UNIT/ML Solostar Pen INJECT 80 UNITS  UNDER THE SKIN IN THE MORNING AND 80 UNITS IN THE EVENING 48 mL 1   Insulin Pen Needle (DROPLET PEN NEEDLES) 32G X 4 MM  MISC USE TO INJECT FIVE TIMES DAILY AS DIRECTED 500 each 1   Insulin Pen Needle 32G X 8 MM MISC Use with insulin x5 times a day 500 each 1   Methoxy PEG-Epoetin Beta (MIRCERA IJ) Mircera     metoprolol succinate (TOPROL-XL) 25 MG 24 hr tablet Take 0.5 tablets (12.5 mg total) by mouth at bedtime. 30 tablet 0   midodrine (PROAMATINE) 10 MG tablet Take 10 mg by mouth 3 (three) times daily.     mupirocin ointment (BACTROBAN) 2 % Apply 1 application topically 2 (two) times daily. 30 g 2   omeprazole (PRILOSEC OTC) 20 MG tablet Take by mouth.     pantoprazole (PROTONIX) 40 MG tablet Take 1 tablet (40 mg total) by mouth daily. 90 tablet 3   rosuvastatin (CRESTOR) 20 MG tablet Take by mouth.     senna-docusate (SENOKOT-S) 8.6-50 MG tablet Take 2 tablets by mouth 2 (two) times daily.     sevelamer carbonate (RENVELA) 800 MG tablet Take 2,400-3,200 mg by mouth in the morning, at noon, in the evening, and at bedtime. Takes 4 tablets with meals and 2 tablets with snacks     silver sulfADIAZINE (SILVADENE) 1 % cream Apply 1 application topically daily. 50 g 0   sodium zirconium cyclosilicate (LOKELMA) 10 g PACK packet Take by mouth.     ticagrelor (BRILINTA) 90 MG TABS tablet Take 1 tablet (90 mg total) by mouth 2 (two) times daily. 180 tablet 3   VELTASSA 8.4 g packet Take 8.4 g by mouth daily.      warfarin (COUMADIN) 5 MG tablet TAKE 1 TABLET EVERY DAY OR AS DIRECTED BY COUMADIN CLINIC ADMIN INSTRUCTIONS (Patient taking differently: Take 5 mg by mouth daily.) 100 tablet 1   witch hazel-glycerin (TUCKS) pad Apply topically as needed for itching. 40 each 12   No current facility-administered medications for this visit.    REVIEW OF SYSTEMS:  _0  denotes positive finding, _1  denotes negative finding Cardiac  Comments:  Chest pain or chest pressure:    Shortness of breath upon exertion:    Short of breath when lying flat:    Irregular heart rhythm:        Vascular    Pain in calf, thigh, or hip  brought on by ambulation:    Pain in feet at night that wakes you up from your sleep:     Blood clot in your veins:    Leg swelling:         Pulmonary    Oxygen at home:    Productive cough:     Wheezing:         Neurologic    Sudden weakness in arms or legs:     Sudden numbness in arms or legs:     Sudden onset of difficulty speaking or slurred speech:    Temporary loss of vision in one eye:     Problems with dizziness:         Gastrointestinal    Blood in stool:     Vomited blood:         Genitourinary    Burning when urinating:     Blood in urine:        Psychiatric    Major depression:         Hematologic  Bleeding problems:    Problems with blood clotting too easily:        Skin    Rashes or ulcers:        Constitutional    Fever or chills:      PHYSICAL EXAM Vitals:   04/08/21 1433  BP: 136/68  Pulse: 84  Resp: 20  Temp: 98 F (36.7 C)  Weight: 288 lb (130.6 kg)  Height: _0  (1.778 m)    Constitutional: Chronically ill appearing. No distress. Morbidly obese. Neurologic: CN intact. no focal findings. no sensory loss. Psychiatric:  Mood and affect symmetric and appropriate. Eyes:  No icterus. No conjunctival pallor. Ears, nose, throat:  mucous membranes moist. Midline trachea.  Cardiac: regular rate and rhythm.  Respiratory:  unlabored. Abdominal: obese. soft, non-tender, non-distended.  Peripheral vascular: no palpable pedal pulses.  Extremity: no edema. no cyanosis. no pallor.  Skin: superficial ulceration about the toes bilaterally.   Lymphatic: no Stemmer's sign. no palpable lymphadenopathy.  PERTINENT LABORATORY AND RADIOLOGIC DATA  Most recent CBC CBC Latest Ref Rng & Units 02/21/2021 02/18/2021 02/17/2021  WBC - 8.0 7.0 7.4  Hemoglobin 13.5 - 17.5 11.4(A) 11.5(L) 10.8(L)  Hematocrit 41 - 53 40(A) 39.5 37.3(L)  Platelets 150 - 400 K/uL - 243 236     Most recent CMP CMP Latest Ref Rng & Units 02/21/2021 02/18/2021 02/17/2021  Glucose 70  - 99 mg/dL - 171(H) 51(L)  BUN 4 - 21 15 48(H) 38(H)  Creatinine 0.61 - 1.24 mg/dL - 8.81(H) 7.52(H)  Sodium 137 - 147 143 135 138  Potassium 3.4 - 5.3 4.7 5.5(H) 4.3  Chloride 99 - 108 105 99 102  CO2 22 - 32 mmol/L - 21(L) 24  Calcium 8.7 - 10.7 8.3(A) 8.6(L) 8.5(L)  Total Protein 6.5 - 8.1 g/dL - - -  Total Bilirubin 0.3 - 1.2 mg/dL - - -  Alkaline Phos 38 - 126 U/L - - -  AST 15 - 41 U/L - - -  ALT 0 - 44 U/L - - -    Renal function CrCl cannot be calculated (Patient's most recent lab result is older than the maximum 21 days allowed.).  Hgb A1c MFr Bld (%)  Date Value  01/14/2021 7.2 (H)    LDL Cholesterol  Date Value Ref Range Status  02/14/2021 31 0 - 99 mg/dL Final    Comment:           Total Cholesterol/HDL:CHD Risk Coronary Heart Disease Risk Table                     Men   Women  1/2 Average Risk   3.4   3.3  Average Risk       5.0   4.4  2 X Average Risk   9.6   7.1  3 X Average Risk  23.4   11.0        Use the calculated Patient Ratio above and the CHD Risk Table to determine the patient's CHD Risk.        ATP III CLASSIFICATION (LDL):  <100     mg/dL   Optimal  100-129  mg/dL   Near or Above                    Optimal  130-159  mg/dL   Borderline  160-189  mg/dL   High  >190     mg/dL   Very High Performed at [facility],  [address] [place], [place] [address]      ABI 03/07/21 LOWER EXTREMITY DOPPLER STUDY   Patient Name:  [name] [name]  Date of Exam:   03/07/2021  Medical Rec #: [id number]       Accession #:    [id number]  Date of Birth: 01/06/1966       Patient Gender: M  Patient Age:   55Y  Exam Location:  [facility]  Procedure:      VAS [place] ABI WITH/WO TBI  Referring Phys: [id number] MATTHEW R WAGONER    ---------------------------------------------------------------------------  -----     Indications: Peripheral artery disease.   High Risk         Hypertension, hyperlipidemia, Diabetes, coronary artery   Factors:          disease.   Other Factors: Multiple failed accessees, Atrial Fib, renal transplant.   Comparison Study: The right ABI and toe brachial index now appears to be                    noncompressible.   Performing Technologist: Alvia Grove RVT      Examination Guidelines: A complete evaluation includes at minimum, Doppler  waveform signals and systolic blood pressure reading at the level of  bilateral  brachial, anterior tibial, and posterior tibial arteries, when vessel  segments  are accessible. Bilateral testing is considered an integral part of a  complete  examination. Photoelectric Plethysmograph (PPG) waveforms and toe systolic  pressure readings are included as required and additional duplex testing  as  needed. Limited examinations for reoccurring indications may be performed  as  noted.      ABI Findings:  +---------+------------------+-----+----------+--------+  Right    Rt Pressure (mmHg)IndexWaveform  Comment   +---------+------------------+-----+----------+--------+  Brachial 124                                        +---------+------------------+-----+----------+--------+  PTA      254               2.05 monophasicbrisk     +---------+------------------+-----+----------+--------+  DP       164               1.32 monophasicbrisk     +---------+------------------+-----+----------+--------+  Great Toe254               2.05 [place]                  +---------+------------------+-----+----------+--------+   +---------+------------------+-----+--------+---------------+  Left     Lt Pressure (mmHg)IndexWaveformComment          +---------+------------------+-----+--------+---------------+  Brachial                                Patient refuxed  +---------+------------------+-----+--------+---------------+  PTA      176               1.42 biphasic                  +---------+------------------+-----+--------+---------------+  DP       177               1.43 biphasic                 +---------+------------------+-----+--------+---------------+  Curt Jews  0.97 Abnormal                 +---------+------------------+-----+--------+---------------+   +-------+-----------+-----------+------------+------------+  ABI/TBIToday's ABIToday's TBIPrevious ABIPrevious TBI  +-------+-----------+-----------+------------+------------+  Right  [place]         [place]         1.27        1.11          +-------+-----------+-----------+------------+------------+  Left   1.43       0.97       2.18        0.64          +-------+-----------+-----------+------------+------------+         Waveforms from prior exam 10/22/2017 revealed triphasic waveforms on the  right. .  Waveforms on the left revealed biphasic waveforms.     Summary:  Right: Resting right ankle-brachial index indicates noncompressible right  lower extremity arteries. TBIs are unreliable. Brisk monophasic waveforms  noted.  Left: Resting left ankle-brachial index indicates noncompressible left  lower extremity arteries. Although the left toe-brachial index appears  normal, the PPG tracings appear dampened.       *See table(s) above for measurements and observations.       Electronically signed by Servando Snare MD on 03/07/2021 at 12:51:34 PM.  Yevonne Aline. Stanford Breed, MD [facility] Phone Number: [phone] 04/08/2021 2:31 PM

## 2021-04-09 ENCOUNTER — Ambulatory Visit (INDEPENDENT_AMBULATORY_CARE_PROVIDER_SITE_OTHER): Payer: Medicare HMO | Admitting: General Practice

## 2021-04-09 ENCOUNTER — Telehealth: Payer: Self-pay

## 2021-04-09 DIAGNOSIS — Z7901 Long term (current) use of anticoagulants: Secondary | ICD-10-CM

## 2021-04-09 DIAGNOSIS — I358 Other nonrheumatic aortic valve disorders: Secondary | ICD-10-CM | POA: Diagnosis not present

## 2021-04-09 DIAGNOSIS — R2681 Unsteadiness on feet: Secondary | ICD-10-CM | POA: Diagnosis not present

## 2021-04-09 DIAGNOSIS — I38 Endocarditis, valve unspecified: Secondary | ICD-10-CM | POA: Diagnosis not present

## 2021-04-09 DIAGNOSIS — M4627 Osteomyelitis of vertebra, lumbosacral region: Secondary | ICD-10-CM | POA: Diagnosis not present

## 2021-04-09 DIAGNOSIS — E1142 Type 2 diabetes mellitus with diabetic polyneuropathy: Secondary | ICD-10-CM | POA: Diagnosis not present

## 2021-04-09 LAB — POCT INR: INR: 4.9 — AB (ref 2.0–3.0)

## 2021-04-09 NOTE — Progress Notes (Signed)
I have reviewed and agree with this plan   Kiandra Sanguinetti, NP  

## 2021-04-09 NOTE — Telephone Encounter (Signed)
Called and spoke with the representative last Friday as well as today and the representative stated that the supplies went out yesterday and I called and spoke with the patient as well to let him know he should be getting them soon. [name] [name]

## 2021-04-09 NOTE — Telephone Encounter (Signed)
Lab results from [facility]R 4.91

## 2021-04-09 NOTE — Patient Instructions (Signed)
Pre visit review using our clinic review tool, if applicable. No additional management support is needed unless otherwise documented below in the visit note.  Skip coumadin dosage today and tomorrow and then continue to take 1 tablet (5 mg) daily.  Patient verbalized understanding.  Re-check in 2 to 3 weeks.  Fax# has been changed to [phone]

## 2021-04-10 DIAGNOSIS — Z992 Dependence on renal dialysis: Secondary | ICD-10-CM | POA: Diagnosis not present

## 2021-04-10 DIAGNOSIS — N2581 Secondary hyperparathyroidism of renal origin: Secondary | ICD-10-CM | POA: Diagnosis not present

## 2021-04-10 DIAGNOSIS — N186 End stage renal disease: Secondary | ICD-10-CM | POA: Diagnosis not present

## 2021-04-10 DIAGNOSIS — E1122 Type 2 diabetes mellitus with diabetic chronic kidney disease: Secondary | ICD-10-CM | POA: Diagnosis not present

## 2021-04-11 ENCOUNTER — Telehealth: Payer: Self-pay | Admitting: Podiatry

## 2021-04-11 DIAGNOSIS — L97512 Non-pressure chronic ulcer of other part of right foot with fat layer exposed: Secondary | ICD-10-CM | POA: Diagnosis not present

## 2021-04-11 NOTE — Telephone Encounter (Signed)
Pt called wanting an update on his supplies and I let him know. He is requesting that you all use another company next time. Please advise.

## 2021-04-12 DIAGNOSIS — N2581 Secondary hyperparathyroidism of renal origin: Secondary | ICD-10-CM | POA: Diagnosis not present

## 2021-04-12 DIAGNOSIS — Z992 Dependence on renal dialysis: Secondary | ICD-10-CM | POA: Diagnosis not present

## 2021-04-12 DIAGNOSIS — N186 End stage renal disease: Secondary | ICD-10-CM | POA: Diagnosis not present

## 2021-04-15 DIAGNOSIS — N186 End stage renal disease: Secondary | ICD-10-CM | POA: Diagnosis not present

## 2021-04-15 DIAGNOSIS — Z992 Dependence on renal dialysis: Secondary | ICD-10-CM | POA: Diagnosis not present

## 2021-04-15 DIAGNOSIS — N2581 Secondary hyperparathyroidism of renal origin: Secondary | ICD-10-CM | POA: Diagnosis not present

## 2021-04-16 ENCOUNTER — Other Ambulatory Visit (HOSPITAL_COMMUNITY): Payer: Medicare HMO

## 2021-04-16 ENCOUNTER — Ambulatory Visit (INDEPENDENT_AMBULATORY_CARE_PROVIDER_SITE_OTHER): Payer: Medicare HMO | Admitting: General Practice

## 2021-04-16 ENCOUNTER — Telehealth: Payer: Self-pay

## 2021-04-16 DIAGNOSIS — R54 Age-related physical debility: Secondary | ICD-10-CM | POA: Diagnosis not present

## 2021-04-16 DIAGNOSIS — N186 End stage renal disease: Secondary | ICD-10-CM | POA: Diagnosis not present

## 2021-04-16 DIAGNOSIS — Z7901 Long term (current) use of anticoagulants: Secondary | ICD-10-CM

## 2021-04-16 DIAGNOSIS — I2510 Atherosclerotic heart disease of native coronary artery without angina pectoris: Secondary | ICD-10-CM | POA: Diagnosis not present

## 2021-04-16 LAB — POCT INR: INR: 2 (ref 2.0–3.0)

## 2021-04-16 NOTE — Telephone Encounter (Signed)
Faxed result from [facility] 1.97 on 04/11/21

## 2021-04-16 NOTE — Patient Instructions (Signed)
Pre visit review using our clinic review tool, if applicable. No additional management support is needed unless otherwise documented below in the visit note.  Continue to take 1 tablet (5 mg) daily.  Patient verbalized understanding.  Re-check in 2 to 3 weeks.  Fax# has been changed to [phone]

## 2021-04-17 DIAGNOSIS — N186 End stage renal disease: Secondary | ICD-10-CM | POA: Diagnosis not present

## 2021-04-17 DIAGNOSIS — N2581 Secondary hyperparathyroidism of renal origin: Secondary | ICD-10-CM | POA: Diagnosis not present

## 2021-04-17 DIAGNOSIS — Z992 Dependence on renal dialysis: Secondary | ICD-10-CM | POA: Diagnosis not present

## 2021-04-18 ENCOUNTER — Ambulatory Visit: Payer: Medicare HMO | Admitting: Nurse Practitioner

## 2021-04-19 DIAGNOSIS — N2581 Secondary hyperparathyroidism of renal origin: Secondary | ICD-10-CM | POA: Diagnosis not present

## 2021-04-19 DIAGNOSIS — N186 End stage renal disease: Secondary | ICD-10-CM | POA: Diagnosis not present

## 2021-04-19 DIAGNOSIS — Z992 Dependence on renal dialysis: Secondary | ICD-10-CM | POA: Diagnosis not present

## 2021-04-21 ENCOUNTER — Other Ambulatory Visit: Payer: Self-pay

## 2021-04-21 ENCOUNTER — Ambulatory Visit (INDEPENDENT_AMBULATORY_CARE_PROVIDER_SITE_OTHER): Payer: Medicare HMO | Admitting: Podiatry

## 2021-04-21 DIAGNOSIS — S90425A Blister (nonthermal), left lesser toe(s), initial encounter: Secondary | ICD-10-CM | POA: Diagnosis not present

## 2021-04-21 DIAGNOSIS — L97512 Non-pressure chronic ulcer of other part of right foot with fat layer exposed: Secondary | ICD-10-CM | POA: Diagnosis not present

## 2021-04-21 DIAGNOSIS — I739 Peripheral vascular disease, unspecified: Secondary | ICD-10-CM | POA: Diagnosis not present

## 2021-04-21 DIAGNOSIS — E1149 Type 2 diabetes mellitus with other diabetic neurological complication: Secondary | ICD-10-CM | POA: Diagnosis not present

## 2021-04-22 DIAGNOSIS — N2581 Secondary hyperparathyroidism of renal origin: Secondary | ICD-10-CM | POA: Diagnosis not present

## 2021-04-22 DIAGNOSIS — Z992 Dependence on renal dialysis: Secondary | ICD-10-CM | POA: Diagnosis not present

## 2021-04-22 DIAGNOSIS — N186 End stage renal disease: Secondary | ICD-10-CM | POA: Diagnosis not present

## 2021-04-23 ENCOUNTER — Telehealth: Payer: Self-pay

## 2021-04-23 NOTE — Telephone Encounter (Signed)
Received message that patient would like a call. Spoke with patient who requested visit as he has some concerns. Visit scheduled with Palliative care NP for 7/15 @ 11

## 2021-04-24 DIAGNOSIS — Z992 Dependence on renal dialysis: Secondary | ICD-10-CM | POA: Diagnosis not present

## 2021-04-24 DIAGNOSIS — N186 End stage renal disease: Secondary | ICD-10-CM | POA: Diagnosis not present

## 2021-04-24 DIAGNOSIS — N2581 Secondary hyperparathyroidism of renal origin: Secondary | ICD-10-CM | POA: Diagnosis not present

## 2021-04-24 NOTE — Progress Notes (Signed)
Subjective: 55 year old male presents the office today for evaluation of calluses, wounds on the right fourth and fifth toes.  He states that the wounds are doing much better.  He has followed up with vascular surgery as well.  Did finally get the wound supplies to be ordered.  He states the wounds are doing better.  Denies any drainage or pus or increase in swelling or redness.   Objective: AAO x3, NAD DP/PT pulses palpable but decreased bilaterally, CRT less than 3  Large areas of hyperkeratotic tissue and dorsal aspect of the right fourth and fifth digits.  They are loose and is able to debride this tissue to the any complications and superficial areas of skin breakdown identified but overall wounds are doing much better.  Is no probing, undermining or tunneling.  No edema, erythema.  No fluctuation crepitation but there is no malodor.  On the left foot there is old blisters present but there is underlying skin intact without any drainage or pus or edema, erythema. No pain with calf compression, swelling, warmth, erythema   Assessment: Preulcerative calluses, wounds right fourth and fifth digits; blisters left foot  Plan: -All treatment options discussed with the patient including all alternatives, risks, complications.  -Sharp debrided loose tissue on the right fourth and fifth toe cellulitis and tissue nipper as well as a 312 with scalpel down to healthy tissue.  Underlying skin appears to be healthy and only superficial area of skin breakdown identified.  Continue with daily dressing changes and offloading.  Monitor closely for any signs or symptoms of infection.  -In regards to the left foot discussed continue to offload.  Small mount of antibiotic ointment dressing changes daily.   Return in about 3 weeks (around 05/12/2021).  X-ray next appointment if wound still present  Trula Slade DPM

## 2021-04-25 ENCOUNTER — Other Ambulatory Visit: Payer: Medicare HMO | Admitting: Nurse Practitioner

## 2021-04-25 ENCOUNTER — Other Ambulatory Visit: Payer: Self-pay

## 2021-04-25 VITALS — HR 58

## 2021-04-25 DIAGNOSIS — Z992 Dependence on renal dialysis: Secondary | ICD-10-CM

## 2021-04-25 DIAGNOSIS — R531 Weakness: Secondary | ICD-10-CM

## 2021-04-25 DIAGNOSIS — Z515 Encounter for palliative care: Secondary | ICD-10-CM

## 2021-04-25 DIAGNOSIS — N186 End stage renal disease: Secondary | ICD-10-CM | POA: Diagnosis not present
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
## 2021-04-26 DIAGNOSIS — N2581 Secondary hyperparathyroidism of renal origin: Secondary | ICD-10-CM | POA: Diagnosis not present

## 2021-04-26 DIAGNOSIS — N186 End stage renal disease: Secondary | ICD-10-CM | POA: Diagnosis not present

## 2021-04-26 DIAGNOSIS — Z992 Dependence on renal dialysis: Secondary | ICD-10-CM | POA: Diagnosis not present

## 2021-04-29 DIAGNOSIS — N186 End stage renal disease: Secondary | ICD-10-CM | POA: Diagnosis not present

## 2021-04-29 DIAGNOSIS — Z992 Dependence on renal dialysis: Secondary | ICD-10-CM | POA: Diagnosis not present

## 2021-04-29 DIAGNOSIS — N2581 Secondary hyperparathyroidism of renal origin: Secondary | ICD-10-CM | POA: Diagnosis not present

## 2021-05-01 DIAGNOSIS — N2581 Secondary hyperparathyroidism of renal origin: Secondary | ICD-10-CM | POA: Diagnosis not present

## 2021-05-01 DIAGNOSIS — Z992 Dependence on renal dialysis: Secondary | ICD-10-CM | POA: Diagnosis not present

## 2021-05-01 DIAGNOSIS — N186 End stage renal disease: Secondary | ICD-10-CM | POA: Diagnosis not present

## 2021-05-02 ENCOUNTER — Ambulatory Visit (HOSPITAL_COMMUNITY): Payer: Medicare HMO | Attending: Cardiovascular Disease

## 2021-05-02 ENCOUNTER — Telehealth: Payer: Self-pay

## 2021-05-02 ENCOUNTER — Other Ambulatory Visit: Payer: Self-pay

## 2021-05-02 DIAGNOSIS — L97512 Non-pressure chronic ulcer of other part of right foot with fat layer exposed: Secondary | ICD-10-CM | POA: Diagnosis not present

## 2021-05-02 DIAGNOSIS — I255 Ischemic cardiomyopathy: Secondary | ICD-10-CM | POA: Diagnosis not present

## 2021-05-02 LAB — ECHOCARDIOGRAM COMPLETE
Area-P 1/2: 4.26 cm2
MV M vel: 4.77 m/s
MV Peak grad: 91 mmHg
MV VTI: 1.96 cm2
P 1/2 time: 336 msec
S' Lateral: 4.25 cm

## 2021-05-02 NOTE — Telephone Encounter (Signed)
pt is calling stating he is in need of his omeprazole (PRILOSEC OTC) 20 MG tablet to be refilled as he has been taking this medication for over 10 years and does not understand why a refill was denied. Pt is asking that a refill please be sent in.   **Sent to [facility]

## 2021-05-03 DIAGNOSIS — Z992 Dependence on renal dialysis: Secondary | ICD-10-CM | POA: Diagnosis not present

## 2021-05-03 DIAGNOSIS — N186 End stage renal disease: Secondary | ICD-10-CM | POA: Diagnosis not present

## 2021-05-03 DIAGNOSIS — N2581 Secondary hyperparathyroidism of renal origin: Secondary | ICD-10-CM | POA: Diagnosis not present

## 2021-05-05 ENCOUNTER — Telehealth: Payer: Self-pay

## 2021-05-05 NOTE — Telephone Encounter (Signed)
This is over the counter and does not require a prescription.

## 2021-05-05 NOTE — Telephone Encounter (Signed)
Ok to refill? Not originally prescribed by you. Please advise

## 2021-05-05 NOTE — Telephone Encounter (Signed)
Responded to patient's message. Tentatively scheduled follow up palliative care visit for 05/29/21 @ 8:30am

## 2021-05-06 DIAGNOSIS — Z992 Dependence on renal dialysis: Secondary | ICD-10-CM | POA: Diagnosis not present

## 2021-05-06 DIAGNOSIS — N2581 Secondary hyperparathyroidism of renal origin: Secondary | ICD-10-CM | POA: Diagnosis not present

## 2021-05-06 DIAGNOSIS — N186 End stage renal disease: Secondary | ICD-10-CM | POA: Diagnosis not present

## 2021-05-08 DIAGNOSIS — N2581 Secondary hyperparathyroidism of renal origin: Secondary | ICD-10-CM | POA: Diagnosis not present

## 2021-05-08 DIAGNOSIS — N186 End stage renal disease: Secondary | ICD-10-CM | POA: Diagnosis not present

## 2021-05-08 DIAGNOSIS — Z992 Dependence on renal dialysis: Secondary | ICD-10-CM | POA: Diagnosis not present

## 2021-05-09 DIAGNOSIS — I358 Other nonrheumatic aortic valve disorders: Secondary | ICD-10-CM | POA: Diagnosis not present

## 2021-05-09 DIAGNOSIS — R2681 Unsteadiness on feet: Secondary | ICD-10-CM | POA: Diagnosis not present

## 2021-05-09 DIAGNOSIS — M4627 Osteomyelitis of vertebra, lumbosacral region: Secondary | ICD-10-CM | POA: Diagnosis not present

## 2021-05-09 DIAGNOSIS — I38 Endocarditis, valve unspecified: Secondary | ICD-10-CM | POA: Diagnosis not present

## 2021-05-09 DIAGNOSIS — E1142 Type 2 diabetes mellitus with diabetic polyneuropathy: Secondary | ICD-10-CM | POA: Diagnosis not present

## 2021-05-10 DIAGNOSIS — N186 End stage renal disease: Secondary | ICD-10-CM | POA: Diagnosis not present

## 2021-05-10 DIAGNOSIS — Z992 Dependence on renal dialysis: Secondary | ICD-10-CM | POA: Diagnosis not present

## 2021-05-10 DIAGNOSIS — N2581 Secondary hyperparathyroidism of renal origin: Secondary | ICD-10-CM | POA: Diagnosis not present

## 2021-05-11 DIAGNOSIS — N186 End stage renal disease: Secondary | ICD-10-CM | POA: Diagnosis not present

## 2021-05-11 DIAGNOSIS — E1122 Type 2 diabetes mellitus with diabetic chronic kidney disease: Secondary | ICD-10-CM | POA: Diagnosis not present

## 2021-05-11 DIAGNOSIS — Z992 Dependence on renal dialysis: Secondary | ICD-10-CM | POA: Diagnosis not present

## 2021-05-12 ENCOUNTER — Encounter: Payer: Self-pay | Admitting: Podiatry

## 2021-05-12 ENCOUNTER — Ambulatory Visit (INDEPENDENT_AMBULATORY_CARE_PROVIDER_SITE_OTHER): Payer: Medicare HMO | Admitting: Podiatry

## 2021-05-12 ENCOUNTER — Other Ambulatory Visit: Payer: Self-pay

## 2021-05-12 DIAGNOSIS — Z7901 Long term (current) use of anticoagulants: Secondary | ICD-10-CM

## 2021-05-12 DIAGNOSIS — E1149 Type 2 diabetes mellitus with other diabetic neurological complication: Secondary | ICD-10-CM | POA: Diagnosis not present

## 2021-05-12 DIAGNOSIS — L97512 Non-pressure chronic ulcer of other part of right foot with fat layer exposed: Secondary | ICD-10-CM | POA: Diagnosis not present

## 2021-05-12 DIAGNOSIS — L84 Corns and callosities: Secondary | ICD-10-CM

## 2021-05-12 DIAGNOSIS — I739 Peripheral vascular disease, unspecified: Secondary | ICD-10-CM | POA: Diagnosis not present

## 2021-05-12 DIAGNOSIS — B351 Tinea unguium: Secondary | ICD-10-CM

## 2021-05-12 DIAGNOSIS — M79675 Pain in left toe(s): Secondary | ICD-10-CM

## 2021-05-12 DIAGNOSIS — M79674 Pain in right toe(s): Secondary | ICD-10-CM | POA: Diagnosis not present

## 2021-05-12 MED ORDER — SILVER SULFADIAZINE 1 % EX CREA: 1.0000 "application " | TOPICAL_CREAM | Freq: Every day | CUTANEOUS | 0 refills | Status: AC

## 2021-05-13 DIAGNOSIS — Z992 Dependence on renal dialysis: Secondary | ICD-10-CM | POA: Diagnosis not present

## 2021-05-13 DIAGNOSIS — N186 End stage renal disease: Secondary | ICD-10-CM | POA: Diagnosis not present

## 2021-05-13 DIAGNOSIS — N2581 Secondary hyperparathyroidism of renal origin: Secondary | ICD-10-CM | POA: Diagnosis not present

## 2021-05-14 ENCOUNTER — Emergency Department (HOSPITAL_COMMUNITY): Payer: Medicare HMO

## 2021-05-14 ENCOUNTER — Telehealth: Payer: Self-pay | Admitting: *Deleted

## 2021-05-14 ENCOUNTER — Emergency Department (HOSPITAL_COMMUNITY)
Admission: EM | Admit: 2021-05-14 | Discharge: 2021-05-14 | Disposition: A | Discharge status: Home / Self Care | Payer: Medicare HMO | Attending: Emergency Medicine | Admitting: Emergency Medicine

## 2021-05-14 ENCOUNTER — Encounter (HOSPITAL_COMMUNITY): Payer: Self-pay

## 2021-05-14 ENCOUNTER — Other Ambulatory Visit: Payer: Self-pay

## 2021-05-14 DIAGNOSIS — Z7902 Long term (current) use of antithrombotics/antiplatelets: Secondary | ICD-10-CM | POA: Diagnosis not present

## 2021-05-14 DIAGNOSIS — E1142 Type 2 diabetes mellitus with diabetic polyneuropathy: Secondary | ICD-10-CM | POA: Diagnosis not present

## 2021-05-14 DIAGNOSIS — Z992 Dependence on renal dialysis: Secondary | ICD-10-CM | POA: Insufficient documentation

## 2021-05-14 DIAGNOSIS — R0902 Hypoxemia: Secondary | ICD-10-CM | POA: Diagnosis not present

## 2021-05-14 DIAGNOSIS — G4489 Other headache syndrome: Secondary | ICD-10-CM | POA: Diagnosis not present

## 2021-05-14 DIAGNOSIS — R29818 Other symptoms and signs involving the nervous system: Secondary | ICD-10-CM | POA: Diagnosis not present

## 2021-05-14 DIAGNOSIS — Z794 Long term (current) use of insulin: Secondary | ICD-10-CM | POA: Insufficient documentation

## 2021-05-14 DIAGNOSIS — I959 Hypotension, unspecified: Secondary | ICD-10-CM | POA: Diagnosis not present

## 2021-05-14 DIAGNOSIS — N186 End stage renal disease: Secondary | ICD-10-CM | POA: Diagnosis not present

## 2021-05-14 DIAGNOSIS — J449 Chronic obstructive pulmonary disease, unspecified: Secondary | ICD-10-CM | POA: Diagnosis not present

## 2021-05-14 DIAGNOSIS — Z86018 Personal history of other benign neoplasm: Secondary | ICD-10-CM | POA: Diagnosis not present

## 2021-05-14 DIAGNOSIS — I2510 Atherosclerotic heart disease of native coronary artery without angina pectoris: Secondary | ICD-10-CM | POA: Insufficient documentation

## 2021-05-14 DIAGNOSIS — I5020 Unspecified systolic (congestive) heart failure: Secondary | ICD-10-CM | POA: Insufficient documentation

## 2021-05-14 DIAGNOSIS — Z7901 Long term (current) use of anticoagulants: Secondary | ICD-10-CM | POA: Insufficient documentation

## 2021-05-14 DIAGNOSIS — I672 Cerebral atherosclerosis: Secondary | ICD-10-CM | POA: Diagnosis not present

## 2021-05-14 DIAGNOSIS — I132 Hypertensive heart and chronic kidney disease with heart failure and with stage 5 chronic kidney disease, or end stage renal disease: Secondary | ICD-10-CM | POA: Insufficient documentation

## 2021-05-14 DIAGNOSIS — Z79899 Other long term (current) drug therapy: Secondary | ICD-10-CM | POA: Insufficient documentation

## 2021-05-14 DIAGNOSIS — I120 Hypertensive chronic kidney disease with stage 5 chronic kidney disease or end stage renal disease: Secondary | ICD-10-CM | POA: Diagnosis not present

## 2021-05-14 DIAGNOSIS — R42 Dizziness and giddiness: Secondary | ICD-10-CM | POA: Diagnosis not present

## 2021-05-14 DIAGNOSIS — E1122 Type 2 diabetes mellitus with diabetic chronic kidney disease: Secondary | ICD-10-CM | POA: Insufficient documentation

## 2021-05-14 LAB — COMPREHENSIVE METABOLIC PANEL
ALT: 12 U/L (ref 0–44)
AST: 18 U/L (ref 15–41)
Albumin: 3.2 g/dL — ABNORMAL LOW (ref 3.5–5.0)
Alkaline Phosphatase: 71 U/L (ref 38–126)
Anion gap: 14 (ref 5–15)
BUN: 29 mg/dL — ABNORMAL HIGH (ref 6–20)
CO2: 25 mmol/L (ref 22–32)
Calcium: 8.3 mg/dL — ABNORMAL LOW (ref 8.9–10.3)
Chloride: 96 mmol/L — ABNORMAL LOW (ref 98–111)
Creatinine, Ser: 6.69 mg/dL — ABNORMAL HIGH (ref 0.61–1.24)
GFR, Estimated: 9 mL/min — ABNORMAL LOW (ref >60)
Glucose, Bld: 222 mg/dL — ABNORMAL HIGH (ref 70–99)
Potassium: 4.5 mmol/L (ref 3.5–5.1)
Sodium: 135 mmol/L (ref 135–145)
Total Bilirubin: 1.6 mg/dL — ABNORMAL HIGH (ref 0.3–1.2)
Total Protein: 7.1 g/dL (ref 6.5–8.1)

## 2021-05-14 LAB — CBC WITH DIFFERENTIAL/PLATELET
Abs Immature Granulocytes: 0.02 10*3/uL (ref 0.00–0.07)
Basophils Absolute: 0 10*3/uL (ref 0.0–0.1)
Basophils Relative: 1 %
Eosinophils Absolute: 0.1 10*3/uL (ref 0.0–0.5)
Eosinophils Relative: 1 %
HCT: 39.6 % (ref 39.0–52.0)
Hemoglobin: 12 g/dL — ABNORMAL LOW (ref 13.0–17.0)
Immature Granulocytes: 0 %
Lymphocytes Relative: 14 %
Lymphs Abs: 1 10*3/uL (ref 0.7–4.0)
MCH: 29.6 pg (ref 26.0–34.0)
MCHC: 30.3 g/dL (ref 30.0–36.0)
MCV: 97.8 fL (ref 80.0–100.0)
Monocytes Absolute: 0.6 10*3/uL (ref 0.1–1.0)
Monocytes Relative: 8 %
Neutro Abs: 5.8 10*3/uL (ref 1.7–7.7)
Neutrophils Relative %: 76 %
Platelets: 347 10*3/uL (ref 150–400)
RBC: 4.05 MIL/uL — ABNORMAL LOW (ref 4.22–5.81)
RDW: 18.6 % — ABNORMAL HIGH (ref 11.5–15.5)
WBC: 7.5 10*3/uL (ref 4.0–10.5)
nRBC: 0 % (ref 0.0–0.2)

## 2021-05-14 LAB — CBG MONITORING, ED: Glucose-Capillary: 235 mg/dL — ABNORMAL HIGH (ref 70–99)

## 2021-05-14 MED ORDER — MECLIZINE HCL 25 MG PO TABS
25.0000 mg | ORAL_TABLET | Freq: Once | ORAL | Status: AC
  Administered 2021-05-14: 25 mg via ORAL
  Filled 2021-05-14: qty 1

## 2021-05-14 MED ORDER — DIPHENHYDRAMINE HCL 50 MG/ML IJ SOLN: 25.0000 mg | Freq: Once | INTRAMUSCULAR | Status: DC

## 2021-05-14 MED ORDER — MECLIZINE HCL 12.5 MG PO TABS: 12.5000 mg | ORAL_TABLET | Freq: Two times a day (BID) | ORAL | 0 refills | Status: DC | PRN

## 2021-05-14 MED ORDER — METOCLOPRAMIDE HCL 5 MG/ML IJ SOLN: 10.0000 mg | Freq: Once | INTRAMUSCULAR | Status: DC

## 2021-05-14 MED ORDER — SODIUM CHLORIDE 0.9 % IV BOLUS
500.0000 mL | Freq: Once | INTRAVENOUS | Status: AC
  Administered 2021-05-14: 500 mL via INTRAVENOUS

## 2021-05-14 NOTE — ED Notes (Signed)
The patient denied dizziness during orthostatic vitals. The patient stated that he felt "sleepy." Upon the standing the patient was unsteady and felt unstable on his feet. The patient was not able to stand for the duration of the orthostatic vitals

## 2021-05-14 NOTE — ED Triage Notes (Signed)
Patient complains of dizziness and headache since Monday evening. Patient complains of dizziness with any movement of the head. Had full dialysis treatment yesterday. Alert and oriented, complains of mild headache with some bilateral eye discomfort

## 2021-05-14 NOTE — ED Notes (Signed)
EDP at bedside to attempt US IV 

## 2021-05-14 NOTE — ED Notes (Signed)
[place] IV removed d/t infiltration.

## 2021-05-14 NOTE — ED Provider Notes (Signed)
[facility] EMERGENCY DEPARTMENT Provider Note   CSN: [id number] Arrival date & time: 05/14/21  1306     History No chief complaint on file.   Arcenio Mullaly is a 55 y.o. male history of CAD, COPD, a flutter on Coumadin, ESRD on dialysis (last HD was today), here presenting with dizziness.  Patient states that he has been dizzy and lightheaded for several days.  He states that he went to dialysis yesterday and dizziness got worse.  Denies any trouble speaking.  Denies any focal weakness.  States that the dizziness is worse when he stands up.  Denies any chest pain or shortness of breath  The history is provided by the patient.      Past Medical History:  Diagnosis Date   Allergic rhinitis    Allergy    Anemia    Aortic insufficiency    SBE in 2016 >> pt declined AVR // a. Echo 7/16:  Mod LVH, EF 50-55%, mild to mod AI, severe LAE, PASP 57 mmHg (LV ID end diastolic 98.3 mm);  b. Echo 2/17:  Mod LVH, EF 50-55%, mod AI, MAC, mild MR, mod LAE, mild to mod TR, PASP 63 mmHg // Echo 2/21: normal EF, mild MR, mild AI   Atrial fibrillation and flutter (HCC)    chronic anticoag with Warfarin // bradycardia noted during admit 10/21   Bilateral carpal tunnel syndrome    Bradycardia    brady/low BP event during admit in 10/21 >> poor candidate for pacer/EPS due to high risk of infection with ESRD; pacer not recommended by EP   CAD (coronary artery disease)    s/p NSTEMI 10/21: cath>> pLAD 99 >> tx with DES; Lat RI 100 and OM1 100 - med Rx   Cataract    removed both eyes    Cholecystitis    dx during admx 10/21 >> HIDA neg // exam benign // high risk for surgery >> no surgical intervention recommended.    Claustrophobia    COPD (chronic obstructive pulmonary disease) (HCC)    Coronary artery disease    Diabetes mellitus    Type II   Diabetic peripheral neuropathy (HCC)    Diabetic retinopathy ([place])    Discitis of lumbar region    resolved   DVT (deep venous thrombosis)  ([place]) 10/07/2015   LLE   Dysrhythmia 07/2020   afib   Endocarditis 11/28/14   ESRD (end stage renal disease) on dialysis [facility])    s/p renal transplant in 2009, failed in 2016   Gait disorder    Gastroparesis    GERD (gastroesophageal reflux disease)    Gout    Hearing difficulty    Hemodialysis patient ([place])    tuesday, thursday, saturday    HFrEF (HF with reduced EF)    Ischemic CM // Echo 10/21: EF 40-45, dist sept and apical AK, mild LVH, normal RVSF, mod MAC   Hyperlipidemia    Hypertension    Incomplete bladder emptying    Leg pain 02/05/11   with walking   Morbid obesity ([place])    Myocardial infarction ([place]) 07/2020   Obstructive sleep apnea    not using CPAP- lost 100lbs   Osteomyelitis (HCC)    Posttraumatic stress disorder    Rotator cuff disorder    Secondary hyperparathyroidism ([place])    Sepsis ([place])    Postive blood cultures -    Sleep apnea    no cpap   Urethral stricture    Vitamin D deficiency  Patient Active Problem List   Diagnosis Date Noted   Non-ST elevation (NSTEMI) myocardial infarction ([place])    Chest pain 02/14/2021   Shortness of breath 02/11/2021   Cardiac arrest ([place]) 01/14/2021   Disorder of phosphorus metabolism, unspecified 01/06/2021   Unspecified infection due to central venous catheter, initial encounter 01/02/2021   Hemodialysis catheter dysfunction ([place]) 11/08/2020   ESRD (end stage renal disease) ([place]) 11/08/2020   Bradycardia 08/03/2020   Hypotension 08/02/2020   CAD (coronary artery disease) 08/02/2020   Atrial Fibrillation/Flutter 07/27/2020   Band keratopathy of both eyes 11/10/2019   Pseudophakia of both eyes 11/10/2019   Routine general medical examination at a health care facility 22/97/9892   Complication of vascular dialysis catheter 07/17/2019   Cholecystitis 04/20/2019   Allergy, unspecified, sequela 12/29/2018   Pain, unspecified 12/29/2018   Leg lesion 06/16/2018   Protein calorie malnutrition ([place]) 05/09/2018    Hyperkalemia 04/11/2018   Hemorrhoid 02/28/2018   Numbness 10/20/2017   Hypocalcemia 02/20/2016   Anterior urethral stricture 12/09/2015   Iron deficiency anemia, unspecified 11/30/2015   ESRD on dialysis ([place]) 11/15/2015   Coagulation defect, unspecified ([place]) 11/02/2015   HFrEF (heart failure with reduced ejection fraction) ([place]) 10/07/2015   Prolonged Q-T interval on ECG 10/07/2015   Chronic deep vein thrombosis (DVT) of left lower extremity (HCC) 10/07/2015   Hypomagnesemia 06/26/2015   Aortic insufficiency 04/12/2015   Endocarditis 12/04/2014   Lumbar discitis 11/22/2014   Back pain at L4-L5 level    Anemia due to chronic kidney disease    Cervical radiculopathy    Diabetic peripheral neuropathy ([place]) 10/16/2014   Renal transplant, status post 04/05/2014   OSA (obstructive sleep apnea) 04/05/2014   Severe obesity (BMI >= 40) ([place]) 04/05/2014   Benign fibroma of prostate 02/05/2014   Bladder neck obstruction 02/05/2014   Visceral obesity 04/08/2012   Immunosuppressive management encounter following kidney transplant 03/24/2012   Diabetic macular edema ([place]) 01/15/2012   Diabetes mellitus, type 2 ([place]) 01/15/2012   IMMUNOCOMPROMISED 01/30/2010   Diarrhea 01/30/2010   HYPERPARATHYROIDISM, SECONDARY 01/27/2010   GOUT 01/27/2010   Anxiety 01/27/2010   INSOMNIA 01/27/2010   HLD (hyperlipidemia) 05/25/2008   Essential hypertension 05/25/2008    Past Surgical History:  Procedure Laterality Date   ARTERIOVENOUS GRAFT PLACEMENT Bilateral "several"   AV FISTULA PLACEMENT Bilateral 2015   AV FISTULA PLACEMENT Right 06/13/2019   Procedure: INSERTION OF ARTERIOVENOUS (AV) GORE-TEX GRAFT RIGHT  ARM;  Surgeon: Elam Dutch, MD;  Location: [facility];  Service: Vascular;  Laterality: Right;   [place] REMOVAL Right 11/08/2020   Procedure: Excision of Foreign Body;  Surgeon: [name], MD;  Location: [facility];  Service: Vascular;  Laterality: Right;   [place]  Right 11/94/1740   BASCILIC VEIN TRANSPOSITION Right 09/27/2015   Procedure: BASILIC VEIN TRANSPOSITION right;  Surgeon: Mal Misty, MD;  Location: [facility];  Service: Vascular;  Laterality: Right;   CARDIAC CATHETERIZATION  07/2020   CATARACT EXTRACTION W/ INTRAOCULAR LENS  IMPLANT, BILATERAL Bilateral    CORONARY STENT INTERVENTION N/A 07/27/2020   Procedure: CORONARY STENT INTERVENTION;  Surgeon: Burnell Blanks, MD;  Location: [facility];  Service: Cardiovascular;  Laterality: N/A;   CORONARY STENT INTERVENTION N/A 02/17/2021   Procedure: CORONARY STENT INTERVENTION;  Surgeon: Burnell Blanks, MD;  Location: [facility];  Service: Cardiovascular;  Laterality: N/A;   CYSTOSCOPY W/ INTERNAL URETHROTOMY  09/2014   EXCHANGE OF A DIALYSIS CATHETER Right 02/14/2021  Procedure: EXCHANGE OF A DIALYSIS CATHETER RIGHT INTERNAL JUGULAR;  Surgeon: Serafina [place], MD;  Location: [facility];  Service: Vascular;  Laterality: Right;   EYE SURGERY Bilateral 2006   INSERTION OF DIALYSIS CATHETER N/A 11/08/2020   Procedure: INSERTION OF DIALYSIS CATHETER;  Surgeon: [name], MD;  Location: [facility];  Service: Vascular;  Laterality: N/A;   IR THROMBECTOMY AV FISTULA W/THROMBOLYSIS/PTA INC/SHUNT/IMG RIGHT Right 08/01/2019   IR US GUIDE VASC ACCESS RIGHT  08/01/2019   KIDNEY TRANSPLANT  2009   LEFT HEART CATH AND CORONARY ANGIOGRAPHY N/A 07/27/2020   Procedure: LEFT HEART CATH AND CORONARY ANGIOGRAPHY;  Surgeon: Burnell Blanks, MD;  Location: [facility];  Service: Cardiovascular;  Laterality: N/A;   LEFT HEART CATH AND CORONARY ANGIOGRAPHY N/A 02/17/2021   Procedure: LEFT HEART CATH AND CORONARY ANGIOGRAPHY;  Surgeon: Burnell Blanks, MD;  Location: [facility];  Service: Cardiovascular;  Laterality: N/A;   SIGMOIDOSCOPY     UPPER EXTREMITY VENOGRAPHY Bilateral 06/02/2019   Procedure: UPPER EXTREMITY VENOGRAPHY;  Surgeon: Elam Dutch, MD;   Location: [facility];  Service: Cardiovascular;  Laterality: Bilateral;   UPPER GASTROINTESTINAL ENDOSCOPY     VASCULAR ACCESS DEVICE INSERTION Right 11/07/2020   Procedure: RIGHT UPPER EXTREMITY HERO GRAFT;  Surgeon: [name], MD;  [facility];  Service: Vascular;  Laterality: Right;       Family History  Problem Relation Age of Onset   Hypertension Mother    Diabetes Mother    Diabetes Father    Diabetes Brother    Colon cancer Neg Hx    Colon polyps Neg Hx    Esophageal cancer Neg Hx    Rectal cancer Neg Hx    Stomach cancer Neg Hx     Social History   Tobacco Use   Smoking status: Never   Smokeless tobacco: Never  Vaping Use   Vaping Use: Never used  Substance Use Topics   Alcohol use: No    Alcohol/week: 0.0 standard drinks   Drug use: No    Home Medications Prior to Admission medications   Medication Sig Start Date End Date Taking? Authorizing Provider  atorvastatin (LIPITOR) 80 MG tablet Take 1 tablet (80 mg total) by mouth daily. 01/17/21 01/17/22 Yes Burnell Blanks, MD  cinacalcet (SENSIPAR) 30 MG tablet Take 30 mg by mouth daily. 02/11/21  Yes [provider]  midodrine (PROAMATINE) 10 MG tablet Take 10 mg by mouth 3 (three) times daily. 09/27/20  Yes [provider]  Accu-Chek FastClix Lancets MISC USE TO CHECK BLOOD SUGARS 4 TIMES A DAY. 08/21/20   Elayne Snare, MD  allopurinol (ZYLOPRIM) 100 MG tablet Take 1 tablet (100 mg total) by mouth 3 (three) times a week. After dialysis 05/17/19   Hoyt Koch, MD  amoxicillin (AMOXIL) 500 MG capsule Take 500 mg by mouth 3 (three) times daily. 01/22/21   [provider]  atorvastatin (LIPITOR) 80 MG tablet Take by mouth. 01/17/21   [provider]  AURYXIA 1 GM 210 MG(Fe) tablet Take 630 mg by mouth 3 (three) times daily with meals. Takes 3 tablets with meal and 2 tablet with snacks 11/08/19   [provider]  busPIRone (BUSPAR) 5 MG tablet TAKE 1  TABLET TWICE DAILY AS NEEDED 01/14/21   Hoyt Koch, MD  calcium acetate (PHOSLO) 667 MG tablet Take by mouth. 02/14/21   [provider]  clonazePAM (KLONOPIN) 0.5 MG tablet Take  1 tablet (0.5 mg total) by mouth 2 (two) times daily as needed for anxiety. 03/27/21   Hoyt Koch, MD  clonazePAM Bobbye [place]) 0.5 MG tablet Take by mouth. 06/05/20   [provider]  clopidogrel (PLAVIX) 75 MG tablet  03/03/21   [provider]  diphenhydrAMINE (BENADRYL) 25 MG tablet Take 1 tablet (25 mg total) by mouth every 8 (eight) hours as needed for itching. 01/14/21   Ripley Fraise, MD  diphenhydrAMINE (SOMINEX) 25 MG tablet Take by mouth. 01/14/21   [provider]  glucose blood (ACCU-CHEK GUIDE) test strip USE AS INSTRUCTED TO CHECK BLOOD SUGAR 3 TIMES DAILY AS NEEDED 08/21/20   Elayne Snare, MD  HUMULIN R U-500 KWIKPEN 500 UNIT/ML kwikpen INJECT 50 UNITS UNDER THE SKIN AT BREAKFAST, 70 UNITS AT LUNCH, AND 60 UNITS AT DINNER. 02/22/21   Elayne Snare, MD  hydrocortisone (ANUSOL-HC) 2.5 % rectal cream Place rectally 4 (four) times daily. 02/18/21   Hosie Poisson, MD  hydrocortisone (ANUSOL-HC) 25 MG suppository Place 1 suppository (25 mg total) rectally 2 (two) times daily. 03/31/21   Hoyt Koch, MD  insulin glargine, 2 Unit Dial, (TOUJEO MAX SOLOSTAR) 300 UNIT/ML Solostar Pen INJECT 80 UNITS UNDER THE SKIN IN THE MORNING AND 80 UNITS IN THE EVENING 12/05/20   Elayne Snare, MD  Insulin Pen Needle (DROPLET PEN NEEDLES) 32G X 4 MM MISC USE TO INJECT FIVE TIMES DAILY AS DIRECTED 02/10/21   Elayne Snare, MD  Insulin Pen Needle 32G X 8 MM MISC Use with insulin x5 times a day 08/16/19   Elayne Snare, MD  Methoxy PEG-Epoetin Beta (MIRCERA IJ) Mircera 08/15/20 08/14/21  [provider]  metoprolol succinate (TOPROL-XL) 25 MG 24 hr tablet Take 0.5 tablets (12.5 mg total) by mouth at bedtime. 02/18/21   Hosie Poisson, MD  mupirocin ointment (BACTROBAN) 2 % Apply 1  application topically 2 (two) times daily. 01/21/21   McDonald, Stephan Minister, DPM  omeprazole (PRILOSEC OTC) 20 MG tablet Take by mouth. 02/14/21   [provider]  pantoprazole (PROTONIX) 40 MG tablet Take 1 tablet (40 mg total) by mouth daily. 01/17/21   Burnell Blanks, MD  rosuvastatin (CRESTOR) 20 MG tablet Take by mouth. 02/14/21   [provider]  senna-docusate (SENOKOT-S) 8.6-50 MG tablet Take 2 tablets by mouth 2 (two) times daily. 02/18/21   Hosie Poisson, MD  sevelamer carbonate (RENVELA) 800 MG tablet Take 2,400-3,200 mg by mouth in the morning, at noon, in the evening, and at bedtime. Takes 4 tablets with meals and 2 tablets with snacks 10/15/20   [provider]  silver sulfADIAZINE (SILVADENE) 1 % cream Apply 1 application topically daily. 05/12/21   Trula Slade, DPM  sodium zirconium cyclosilicate (LOKELMA) 10 g PACK packet Take by mouth. 02/14/21   [provider]  ticagrelor (BRILINTA) 90 MG TABS tablet Take 1 tablet (90 mg total) by mouth 2 (two) times daily. Patient not taking: Reported on 04/08/2021 03/17/21   Burnell Blanks, MD  VELTASSA 8.4 g packet Take 8.4 g by mouth daily.  12/30/16   [provider]  warfarin (COUMADIN) 5 MG tablet TAKE 1 TABLET EVERY DAY OR AS DIRECTED BY COUMADIN CLINIC ADMIN INSTRUCTIONS Patient taking differently: Take 5 mg by mouth daily. 02/10/21   Hoyt Koch, MD  witch hazel-glycerin (TUCKS) pad Apply topically as needed for itching. 02/18/21   Hosie Poisson, MD    Allergies    Ace inhibitors and Pork-derived products  Review  of Systems   Review of Systems  Neurological:  Positive for dizziness.  All other systems reviewed and are negative.  Physical Exam Updated Vital Signs BP (!) 113/53   Pulse 87   Temp 98.4 F (36.9 C) (Oral)   Resp (!) 23   SpO2 98%   Physical Exam Vitals and nursing note reviewed.  Constitutional:      Comments: Chronically ill but not acutely ill  HENT:      Head: Normocephalic.     Nose: Nose normal.     Mouth/Throat:     Mouth: Mucous membranes are dry.  Eyes:     Extraocular Movements: Extraocular movements intact.     Pupils: Pupils are equal, round, and reactive to light.  Cardiovascular:     Rate and Rhythm: Normal rate and regular rhythm.     Pulses: Normal pulses.     Heart sounds: Normal heart sounds.  Pulmonary:     Effort: Pulmonary effort is normal.     Breath sounds: Normal breath sounds.  Abdominal:     General: Abdomen is flat.     Palpations: Abdomen is soft.  Musculoskeletal:        General: Normal range of motion.     Cervical back: Normal range of motion and neck supple.  Skin:    General: Skin is warm.     Capillary Refill: Capillary refill takes less than 2 seconds.  Neurological:     General: No focal deficit present.     Mental Status: He is alert and oriented to person, place, and time.     Cranial Nerves: No cranial nerve deficit.     Sensory: No sensory deficit.     Motor: No weakness.     Coordination: Coordination normal.     Comments: No obvious facial droop.  Normal strength and sensation bilateral arms and legs   Psychiatric:        Mood and Affect: Mood normal.        Behavior: Behavior normal.    ED Results / Procedures / Treatments   Labs (all labs ordered are listed, but only abnormal results are displayed) Labs Reviewed  CBC WITH DIFFERENTIAL/PLATELET - Abnormal; Notable for the following components:      Result Value   RBC 4.05 (*)    Hemoglobin 12.0 (*)    RDW 18.6 (*)    All other components within normal limits  COMPREHENSIVE METABOLIC PANEL - Abnormal; Notable for the following components:   Chloride 96 (*)    Glucose, Bld 222 (*)    BUN 29 (*)    Creatinine, Ser 6.69 (*)    Calcium 8.3 (*)    Albumin 3.2 (*)    Total Bilirubin 1.6 (*)    GFR, Estimated 9 (*)    All other components within normal limits  CBG MONITORING, ED - Abnormal; Notable for the following  components:   Glucose-Capillary 235 (*)    All other components within normal limits    EKG None  ED ECG REPORT   Date: 05/14/2021  Rate: 86  Rhythm: atrial flutter  QRS Axis: normal  Intervals: normal  ST/T Wave abnormalities: normal  Conduction Disutrbances: 3:1 AV block  Narrative Interpretation:   Old EKG Reviewed: changes noted  I have personally reviewed the EKG tracing and agree with the computerized printout as noted.  Radiology CT HEAD WO CONTRAST (5MM)  Result Date: 05/14/2021 CLINICAL DATA:  Neuro deficit, acute, stroke suspected EXAM: CT HEAD WITHOUT  CONTRAST TECHNIQUE: Contiguous axial images were obtained from the base of the skull through the vertex without intravenous contrast. COMPARISON:  May 2017 FINDINGS: Brain: There is no acute intracranial hemorrhage, mass effect, or edema. Gray-white differentiation is preserved. There is no extra-axial fluid collection. Ventricles and sulci are within normal limits in size and configuration. Minimal patchy low-attenuation in the supratentorial white matter is nonspecific but may reflect minor chronic microvascular ischemic changes. Vascular: There is atherosclerotic calcification at the skull base. Skull: Calvarium is unremarkable. Sinuses/Orbits: No acute finding. Other: None. IMPRESSION: No acute intracranial abnormality. Electronically Signed   By: Macy Mis M.D.   On: 05/14/2021 15:14    Procedures Procedures   Medications Ordered in ED Medications  sodium chloride 0.9 % bolus 500 mL (0 mLs Intravenous Stopped 05/14/21 1745)  meclizine (ANTIVERT) tablet 25 mg (25 mg Oral Given 05/14/21 1605)    ED Course  I have reviewed the triage vital signs and the nursing notes.  Pertinent labs & imaging results that were available during my care of the patient were reviewed by me and considered in my medical decision making (see chart for details).    MDM Rules/Calculators/A&P                          Staci Carver is a  55 y.o. male here with dizziness.  Patient's blood pressure is in the 80s to 90.  I think likely orthostatic hypotension.  He just finished dialysis may have some fluid shift as well.  We will get labs and orthostatics  6:57 PM Patient's blood pressure went up to 113/53 after IV fluids.  Patient's potassium is normal.  Better after fluids and meclizine. CT head unremarkable. I doubt posterior circulation stroke   Final Clinical Impression(s) / ED Diagnoses Final diagnoses:  None    Rx / DC Orders ED Discharge Orders     None        Drenda Freeze, MD 05/14/21 1901

## 2021-05-14 NOTE — Telephone Encounter (Signed)
I spoke w patient.  Informed him of echo result and plan to have him return to office to review with Dr. Angelena Form and discuss referring to EP.  He is in agreement w this but today is feeling unwell.    He told me he is thinking of calling an ambulance to take him to the hospital this morning.  Since yesterday after dialysis he has been dizzy.  "My head is not normal. I don't have balance in my head right now."   No room spinning.  Denies falls.  I agreed he should call EMS and be seen for this.  He reached out to primary care and has an appointment next week.

## 2021-05-14 NOTE — ED Provider Notes (Signed)
Emergency Medicine Provider Triage Evaluation Note  [name] [name] , a 55 y.o. male  was evaluated in triage.  Pt complains of HA and dizziness x3 days. Feels off balance - like he cant sit up straight. No numbness or weakness of upper or lower ext. No h/o similar. No infectious sxs  Review of Systems  Positive: Dizziness, HA Negative: weakness  Physical Exam  BP (!) 99/43 (BP Location: Right Arm)   Pulse 87   Temp 98.4 F (36.9 C) (Oral)   Resp 18   SpO2 96%  Gen:   Awake, no distress   Resp:  Normal effort  MSK:   Moves extremities without difficulty  Other:  CN intact.  Strength and sensation intact x4.  Occasionally patient leaning to the left but able to straighten himself out.  Medical Decision Making  Medically screening exam initiated at 1:26 PM.  Appropriate orders placed.  [name] [name] was informed that the remainder of the evaluation will be completed by another provider, this initial triage assessment does not replace that evaluation, and the importance of remaining in the ED until their evaluation is complete.  Labs and CT head ordered   Franchot Heidelberg, PA-C 05/14/21 1338    Godfrey Pick, MD 05/16/21 [id number]

## 2021-05-14 NOTE — ED Notes (Signed)
Patient BIB [facility] from home for evaluation of headache x3 days. BP 80/60 which patient reports is normal for him. Last full dialysis yesterday, schedule T,TH,Sat.

## 2021-05-14 NOTE — Discharge Instructions (Addendum)
Stay hydrated.  Take meclizine as needed for dizziness  See your kidney doctor for follow up. You may want to have less fluid taken off tomorrow   Return to ER if you have worse dizziness, passing out, trouble speaking, weakness

## 2021-05-14 NOTE — Progress Notes (Signed)
Subjective: 55 year old male presents the office today for evaluation of calluses, wounds on the right fourth and fifth toes.  He states they are healed and he needs to have his nails trimmed.  He states on the left third toe he has some blood at the tip of the toe around the nail.  No open sores that he reports.  No swelling or redness or any drainage.  No recent treatment otherwise.   Objective: AAO x3, NAD DP/PT pulses palpable but decreased bilaterally, CRT less than 3  Large areas of hyperkeratotic tissue and dorsal aspect of the right fourth and fifth digits.,  But appear to be smaller compared to last appointment.  Upon debridement of the right fourth toe the superficial area skin breakdown without any probing, undermining or tunneling.  No exposed bone or tendon.  Appears to be almost healed.  The distal aspect of the left third toe is a flat appearing blood blister but no open sore.  There is no open lesions on the left foot. The nails appear to be hypertrophic, dystrophic with yellow to brown discoloration x10 causing discomfort.  No swelling or redness to the toenail sites.  No pain with calf compression, swelling, warmth, erythema   Assessment: Preulcerative calluses, wounds right fourth and fifth digits; preulcerative area left foot; symptomatic onychomycosis  Plan: -All treatment options discussed with the patient including all alternatives, risks, complications.  -Sharp debrided loose tissue on the right fourth and fifth toe cellulitis and tissue nipper as well as a 312 with scalpel down to healthy tissue.  Still wound present to right fourth toe was debrided to healthy, granular tissue.  Recommend to continue with daily dressing changes.  Prescribed Silvadene.  I will order other dressing supplies through [facility].  Discussed surgical shoe for offloading -Monitor left foot preulcerative areas.  Offloading. -Sharply debrided nails x10 without any complications or bleeding -Monitor for  any clinical signs or symptoms of infection and directed to call the office immediately should any occur or go to the ER.  Return in about 2 weeks (around 05/26/2021).  X-ray right foot next appointment if wound present  Trula Slade DPM

## 2021-05-15 DIAGNOSIS — Z992 Dependence on renal dialysis: Secondary | ICD-10-CM | POA: Diagnosis not present

## 2021-05-15 DIAGNOSIS — N186 End stage renal disease: Secondary | ICD-10-CM | POA: Diagnosis not present

## 2021-05-15 DIAGNOSIS — N2581 Secondary hyperparathyroidism of renal origin: Secondary | ICD-10-CM | POA: Diagnosis not present

## 2021-05-16 ENCOUNTER — Ambulatory Visit: Payer: Medicare HMO | Admitting: Gastroenterology

## 2021-05-17 DIAGNOSIS — N186 End stage renal disease: Secondary | ICD-10-CM | POA: Diagnosis not present

## 2021-05-17 DIAGNOSIS — N2581 Secondary hyperparathyroidism of renal origin: Secondary | ICD-10-CM | POA: Diagnosis not present

## 2021-05-17 DIAGNOSIS — Z992 Dependence on renal dialysis: Secondary | ICD-10-CM | POA: Diagnosis not present

## 2021-05-19 ENCOUNTER — Telehealth: Payer: Self-pay | Admitting: *Deleted

## 2021-05-19 DIAGNOSIS — L97509 Non-pressure chronic ulcer of other part of unspecified foot with unspecified severity: Secondary | ICD-10-CM | POA: Diagnosis not present

## 2021-05-19 NOTE — Telephone Encounter (Signed)
Returned call to [employer] and gave verbal order per physician: frequency of change which  is daily.

## 2021-05-19 NOTE — Telephone Encounter (Signed)
[facility] is requesting a request for frequency of change for wound care orders. Please advise.

## 2021-05-20 DIAGNOSIS — Z992 Dependence on renal dialysis: Secondary | ICD-10-CM | POA: Diagnosis not present

## 2021-05-20 DIAGNOSIS — N186 End stage renal disease: Secondary | ICD-10-CM | POA: Diagnosis not present

## 2021-05-20 DIAGNOSIS — N2581 Secondary hyperparathyroidism of renal origin: Secondary | ICD-10-CM | POA: Diagnosis not present

## 2021-05-21 NOTE — Telephone Encounter (Signed)
Spoke w patient.  He was calling to follow up on getting appointment to discuss EP consult.  Will review with Dr. Angelena Form where we can add him in and call pt back.

## 2021-05-21 NOTE — Telephone Encounter (Signed)
Patient calling back to speak with Michalene.

## 2021-05-22 ENCOUNTER — Ambulatory Visit: Payer: Medicare HMO | Admitting: Internal Medicine

## 2021-05-22 DIAGNOSIS — N2581 Secondary hyperparathyroidism of renal origin: Secondary | ICD-10-CM | POA: Diagnosis not present

## 2021-05-22 DIAGNOSIS — N186 End stage renal disease: Secondary | ICD-10-CM | POA: Diagnosis not present

## 2021-05-22 DIAGNOSIS — Z992 Dependence on renal dialysis: Secondary | ICD-10-CM | POA: Diagnosis not present

## 2021-05-22 LAB — POCT INR: INR: 3.7 — AB (ref 2.0–3.0)

## 2021-05-23 ENCOUNTER — Ambulatory Visit: Payer: Medicare HMO | Admitting: Internal Medicine

## 2021-05-24 DIAGNOSIS — N2581 Secondary hyperparathyroidism of renal origin: Secondary | ICD-10-CM | POA: Diagnosis not present

## 2021-05-24 DIAGNOSIS — Z992 Dependence on renal dialysis: Secondary | ICD-10-CM | POA: Diagnosis not present

## 2021-05-24 DIAGNOSIS — N186 End stage renal disease: Secondary | ICD-10-CM | POA: Diagnosis not present

## 2021-05-26 ENCOUNTER — Ambulatory Visit: Payer: Medicare HMO | Admitting: Podiatry

## 2021-05-26 DIAGNOSIS — N2581 Secondary hyperparathyroidism of renal origin: Secondary | ICD-10-CM | POA: Diagnosis not present

## 2021-05-26 DIAGNOSIS — Z992 Dependence on renal dialysis: Secondary | ICD-10-CM | POA: Diagnosis not present

## 2021-05-26 DIAGNOSIS — N186 End stage renal disease: Secondary | ICD-10-CM | POA: Diagnosis not present

## 2021-05-27 DIAGNOSIS — Z940 Kidney transplant status: Secondary | ICD-10-CM | POA: Diagnosis not present

## 2021-05-27 DIAGNOSIS — I255 Ischemic cardiomyopathy: Secondary | ICD-10-CM | POA: Diagnosis not present

## 2021-05-27 DIAGNOSIS — I120 Hypertensive chronic kidney disease with stage 5 chronic kidney disease or end stage renal disease: Secondary | ICD-10-CM | POA: Diagnosis not present

## 2021-05-27 DIAGNOSIS — Z86718 Personal history of other venous thrombosis and embolism: Secondary | ICD-10-CM | POA: Diagnosis not present

## 2021-05-27 DIAGNOSIS — E8770 Fluid overload, unspecified: Secondary | ICD-10-CM | POA: Diagnosis not present

## 2021-05-27 DIAGNOSIS — E113519 Type 2 diabetes mellitus with proliferative diabetic retinopathy with macular edema, unspecified eye: Secondary | ICD-10-CM | POA: Diagnosis not present

## 2021-05-27 DIAGNOSIS — E1122 Type 2 diabetes mellitus with diabetic chronic kidney disease: Secondary | ICD-10-CM | POA: Diagnosis not present

## 2021-05-27 DIAGNOSIS — T8619 Other complication of kidney transplant: Secondary | ICD-10-CM | POA: Diagnosis not present

## 2021-05-27 DIAGNOSIS — E1169 Type 2 diabetes mellitus with other specified complication: Secondary | ICD-10-CM | POA: Diagnosis not present

## 2021-05-27 DIAGNOSIS — E1165 Type 2 diabetes mellitus with hyperglycemia: Secondary | ICD-10-CM | POA: Diagnosis not present

## 2021-05-27 DIAGNOSIS — I517 Cardiomegaly: Secondary | ICD-10-CM | POA: Diagnosis not present

## 2021-05-27 DIAGNOSIS — Z955 Presence of coronary angioplasty implant and graft: Secondary | ICD-10-CM | POA: Diagnosis not present

## 2021-05-27 DIAGNOSIS — T8612 Kidney transplant failure: Secondary | ICD-10-CM | POA: Diagnosis not present

## 2021-05-27 DIAGNOSIS — I10 Essential (primary) hypertension: Secondary | ICD-10-CM | POA: Diagnosis not present

## 2021-05-27 DIAGNOSIS — I219 Acute myocardial infarction, unspecified: Secondary | ICD-10-CM | POA: Diagnosis not present

## 2021-05-27 DIAGNOSIS — I469 Cardiac arrest, cause unspecified: Secondary | ICD-10-CM | POA: Diagnosis not present

## 2021-05-27 DIAGNOSIS — I484 Atypical atrial flutter: Secondary | ICD-10-CM | POA: Diagnosis not present

## 2021-05-27 DIAGNOSIS — I700 Atherosclerosis of aorta: Secondary | ICD-10-CM | POA: Diagnosis not present

## 2021-05-27 DIAGNOSIS — I272 Pulmonary hypertension, unspecified: Secondary | ICD-10-CM | POA: Diagnosis not present

## 2021-05-27 DIAGNOSIS — D631 Anemia in chronic kidney disease: Secondary | ICD-10-CM | POA: Diagnosis not present

## 2021-05-27 DIAGNOSIS — I4892 Unspecified atrial flutter: Secondary | ICD-10-CM | POA: Diagnosis not present

## 2021-05-27 DIAGNOSIS — I213 ST elevation (STEMI) myocardial infarction of unspecified site: Secondary | ICD-10-CM | POA: Diagnosis not present

## 2021-05-27 DIAGNOSIS — N186 End stage renal disease: Secondary | ICD-10-CM | POA: Diagnosis not present

## 2021-05-27 DIAGNOSIS — Z8679 Personal history of other diseases of the circulatory system: Secondary | ICD-10-CM | POA: Diagnosis not present

## 2021-05-27 DIAGNOSIS — J90 Pleural effusion, not elsewhere classified: Secondary | ICD-10-CM | POA: Diagnosis not present

## 2021-05-27 DIAGNOSIS — T82590A Other mechanical complication of surgically created arteriovenous fistula, initial encounter: Secondary | ICD-10-CM | POA: Diagnosis not present

## 2021-05-27 DIAGNOSIS — Z7901 Long term (current) use of anticoagulants: Secondary | ICD-10-CM | POA: Diagnosis not present

## 2021-05-27 DIAGNOSIS — I340 Nonrheumatic mitral (valve) insufficiency: Secondary | ICD-10-CM | POA: Diagnosis not present

## 2021-05-27 DIAGNOSIS — Z794 Long term (current) use of insulin: Secondary | ICD-10-CM | POA: Diagnosis not present

## 2021-05-27 DIAGNOSIS — Z9889 Other specified postprocedural states: Secondary | ICD-10-CM | POA: Diagnosis not present

## 2021-05-27 DIAGNOSIS — Z6841 Body Mass Index (BMI) 40.0 and over, adult: Secondary | ICD-10-CM | POA: Diagnosis not present

## 2021-05-27 DIAGNOSIS — I2109 ST elevation (STEMI) myocardial infarction involving other coronary artery of anterior wall: Secondary | ICD-10-CM | POA: Diagnosis not present

## 2021-05-27 DIAGNOSIS — E1142 Type 2 diabetes mellitus with diabetic polyneuropathy: Secondary | ICD-10-CM | POA: Diagnosis not present

## 2021-05-27 DIAGNOSIS — E875 Hyperkalemia: Secondary | ICD-10-CM | POA: Diagnosis not present

## 2021-05-27 DIAGNOSIS — I454 Nonspecific intraventricular block: Secondary | ICD-10-CM | POA: Diagnosis not present

## 2021-05-27 DIAGNOSIS — Z8674 Personal history of sudden cardiac arrest: Secondary | ICD-10-CM | POA: Diagnosis not present

## 2021-05-27 DIAGNOSIS — J810 Acute pulmonary edema: Secondary | ICD-10-CM | POA: Diagnosis not present

## 2021-05-27 DIAGNOSIS — I083 Combined rheumatic disorders of mitral, aortic and tricuspid valves: Secondary | ICD-10-CM | POA: Diagnosis not present

## 2021-05-27 DIAGNOSIS — I4510 Unspecified right bundle-branch block: Secondary | ICD-10-CM | POA: Diagnosis not present

## 2021-05-27 DIAGNOSIS — I2510 Atherosclerotic heart disease of native coronary artery without angina pectoris: Secondary | ICD-10-CM | POA: Diagnosis not present

## 2021-05-27 DIAGNOSIS — Z992 Dependence on renal dialysis: Secondary | ICD-10-CM | POA: Diagnosis not present

## 2021-05-27 DIAGNOSIS — I770 Arteriovenous fistula, acquired: Secondary | ICD-10-CM | POA: Diagnosis not present

## 2021-05-27 DIAGNOSIS — Z79899 Other long term (current) drug therapy: Secondary | ICD-10-CM | POA: Diagnosis not present

## 2021-05-27 DIAGNOSIS — I440 Atrioventricular block, first degree: Secondary | ICD-10-CM | POA: Diagnosis not present

## 2021-05-27 DIAGNOSIS — I5020 Unspecified systolic (congestive) heart failure: Secondary | ICD-10-CM | POA: Diagnosis not present

## 2021-05-27 DIAGNOSIS — E8881 Metabolic syndrome: Secondary | ICD-10-CM | POA: Diagnosis not present

## 2021-05-27 DIAGNOSIS — I1310 Hypertensive heart and chronic kidney disease without heart failure, with stage 1 through stage 4 chronic kidney disease, or unspecified chronic kidney disease: Secondary | ICD-10-CM | POA: Diagnosis not present

## 2021-05-27 DIAGNOSIS — N2581 Secondary hyperparathyroidism of renal origin: Secondary | ICD-10-CM | POA: Diagnosis not present

## 2021-05-27 DIAGNOSIS — E871 Hypo-osmolality and hyponatremia: Secondary | ICD-10-CM | POA: Diagnosis not present

## 2021-05-27 DIAGNOSIS — E669 Obesity, unspecified: Secondary | ICD-10-CM | POA: Diagnosis not present

## 2021-05-27 DIAGNOSIS — I4891 Unspecified atrial fibrillation: Secondary | ICD-10-CM | POA: Diagnosis not present

## 2021-05-27 DIAGNOSIS — I081 Rheumatic disorders of both mitral and tricuspid valves: Secondary | ICD-10-CM | POA: Diagnosis not present

## 2021-05-28 ENCOUNTER — Other Ambulatory Visit (HOSPITAL_COMMUNITY): Payer: Self-pay

## 2021-05-28 ENCOUNTER — Ambulatory Visit: Payer: Medicare HMO | Admitting: Internal Medicine

## 2021-05-28 ENCOUNTER — Telehealth: Payer: Self-pay

## 2021-05-28 NOTE — Telephone Encounter (Signed)
Phone call placed to check in on patient and to remind him of scheduled Palliative care visit. Patient shared that he is currently in the ICU @ [facility] in [place]. Updated Hospital liaison team and Community Palliative team

## 2021-06-02 ENCOUNTER — Ambulatory Visit: Payer: Medicare HMO | Admitting: Podiatry

## 2021-06-02 ENCOUNTER — Ambulatory Visit: Payer: Medicare HMO | Admitting: Internal Medicine

## 2021-06-05 ENCOUNTER — Telehealth: Payer: Self-pay

## 2021-06-05 NOTE — Telephone Encounter (Signed)
[facility] dialysis center and requested last INR and spoke with Tiffany. She reported last INR was 8/11 and he has been in the hospital for some time. Requested it be faxed. She will fax to [place]

## 2021-06-06 ENCOUNTER — Other Ambulatory Visit: Payer: Self-pay | Admitting: Endocrinology

## 2021-06-07 ENCOUNTER — Other Ambulatory Visit: Payer: Self-pay

## 2021-06-07 DIAGNOSIS — E1122 Type 2 diabetes mellitus with diabetic chronic kidney disease: Secondary | ICD-10-CM

## 2021-06-07 DIAGNOSIS — N2581 Secondary hyperparathyroidism of renal origin: Secondary | ICD-10-CM | POA: Diagnosis not present

## 2021-06-07 DIAGNOSIS — Z794 Long term (current) use of insulin: Secondary | ICD-10-CM

## 2021-06-07 DIAGNOSIS — N186 End stage renal disease: Secondary | ICD-10-CM | POA: Diagnosis not present

## 2021-06-07 DIAGNOSIS — Z992 Dependence on renal dialysis: Secondary | ICD-10-CM | POA: Diagnosis not present

## 2021-06-07 MED ORDER — TOUJEO MAX SOLOSTAR 300 UNIT/ML ~~LOC~~ SOPN: PEN_INJECTOR | SUBCUTANEOUS | 1 refills | Status: AC

## 2021-06-09 ENCOUNTER — Other Ambulatory Visit: Payer: Medicare HMO

## 2021-06-09 ENCOUNTER — Other Ambulatory Visit: Payer: Self-pay

## 2021-06-09 ENCOUNTER — Ambulatory Visit (INDEPENDENT_AMBULATORY_CARE_PROVIDER_SITE_OTHER): Payer: Medicare HMO | Admitting: Cardiovascular Disease

## 2021-06-09 ENCOUNTER — Encounter: Payer: Self-pay | Admitting: Cardiovascular Disease

## 2021-06-09 VITALS — BP 126/62 | HR 78 | Ht 70.0 in | Wt 288.0 lb

## 2021-06-09 DIAGNOSIS — M4627 Osteomyelitis of vertebra, lumbosacral region: Secondary | ICD-10-CM | POA: Diagnosis not present

## 2021-06-09 DIAGNOSIS — I255 Ischemic cardiomyopathy: Secondary | ICD-10-CM

## 2021-06-09 DIAGNOSIS — I2510 Atherosclerotic heart disease of native coronary artery without angina pectoris: Secondary | ICD-10-CM

## 2021-06-09 DIAGNOSIS — I5032 Chronic diastolic (congestive) heart failure: Secondary | ICD-10-CM | POA: Diagnosis not present

## 2021-06-09 DIAGNOSIS — Z992 Dependence on renal dialysis: Secondary | ICD-10-CM

## 2021-06-09 DIAGNOSIS — I10 Essential (primary) hypertension: Secondary | ICD-10-CM | POA: Diagnosis not present

## 2021-06-09 DIAGNOSIS — N186 End stage renal disease: Secondary | ICD-10-CM

## 2021-06-09 DIAGNOSIS — I480 Paroxysmal atrial fibrillation: Secondary | ICD-10-CM | POA: Diagnosis not present

## 2021-06-09 DIAGNOSIS — R2681 Unsteadiness on feet: Secondary | ICD-10-CM | POA: Diagnosis not present

## 2021-06-09 DIAGNOSIS — E1142 Type 2 diabetes mellitus with diabetic polyneuropathy: Secondary | ICD-10-CM | POA: Diagnosis not present

## 2021-06-09 DIAGNOSIS — I38 Endocarditis, valve unspecified: Secondary | ICD-10-CM | POA: Diagnosis not present

## 2021-06-09 DIAGNOSIS — I358 Other nonrheumatic aortic valve disorders: Secondary | ICD-10-CM | POA: Diagnosis not present

## 2021-06-09 NOTE — Patient Instructions (Signed)

## 2021-06-09 NOTE — Progress Notes (Signed)
Chief Complaint  Patient presents with   Follow-up    CAD    History of Present Illness: 55 yo [name] male with history of aortic valve insufficiency, DM, HTN, HLD, morbid obesity, ESRD on HD, prior endocarditis, atrial fibrillation, CAD, ischemic cardiomyopathy and DVT who is here today for cardiac follow up. He is s/p failed kidney transpant in 2009 at [facility] in [place]. I saw him for the first time in 2011 for evaluation of chest pain. He has chronic leg weakness and swelling and is mostly immobile. Echo in 2011 showed moderate LVH with normal LV systolic function. Since then he has had evidence of aortic valve insufficiency. He was admitted to [facility] and then [facility] in 2016 with discitis and he had evidence of aortic valve endocarditis. The plan was for antibiotics and then surgery but he did not tolerate his cardiac cath and no surgery was performed. He missed all f/u appts at [facility] with the cardiology division. He did see ID at [facility] and blood cultures normalized. Echo February 2021 with LVEF=55-60%, mild MR and mild AI. He has a prior DVT and has been on coumadin.  He was found to be in atrial flutter in January 2020 and was seen in the atrial fibrillation clinic and no changes were recommended. He was admitted to [facility] 07/27/20 with chest pain and atrial fib with RVR and ruled in for a NSTEMI. Cardiac cath 07/27/20 with severe stenosis in the proximal LAD treated with a drug eluting stent. The ramus intermediate branch and OM were chronically occluded. Echo 07/29/20 with LVEF=40-45%, no significant valve disease noted. Possible septic shock during the admission. No ARB or Ace-inh added due to hypotension. Coumadin continued with rate controlled atrial flutter. He had chest pain in May 2022 and mildly elevated troponins while being admitted for dialysis catheter exchange. Cardiac cath 02/17/21 with occlusion of the LAD beyond the stented segment. A drug eluting stent was  placed. The intermediate branch and the small obtuse marginal branch were totally occluded, both filling from right to left collaterals. Echo 02/15/21 with LVEF=25-30%, moderate LVH, moderate RV dysfunction. Mild MR. Mild AI. Repeat echo 05/02/21 with LVEF=25-30% with hypokinesis of the anterior and apical walls. He was admitted to [facility] August 2022 for creation of left arm dialysis fistula. He had a PEA arrest following the procedure felt to be due to hypercarbia. He developed atrial fib with RVR during the hospitalization and had a TEE guided cardioversion. TTE at [facility] 05/27/21 with LVEF=45-50%. Moderate MR.   He is here today for follow up. The patient denies any chest pain, dyspnea, palpitations, lower extremity edema, orthopnea, PND. He does endorse dizziness with standing but no near syncope or syncope.     Primary Care Physician: Hoyt Koch, MD  Past Medical History:  Diagnosis Date   Allergic rhinitis    Allergy    Anemia    Aortic insufficiency    SBE in 2016 >> pt declined AVR // a. Echo 7/16:  Mod LVH, EF 50-55%, mild to mod AI, severe LAE, PASP 57 mmHg (LV ID end diastolic 06.2 mm);  b. Echo 2/17:  Mod LVH, EF 50-55%, mod AI, MAC, mild MR, mod LAE, mild to mod TR, PASP 63 mmHg // Echo 2/21: normal EF, mild MR, mild AI   Atrial fibrillation and flutter (HCC)    chronic anticoag with Warfarin // bradycardia noted during admit 10/21   Bilateral carpal tunnel syndrome    Bradycardia  brady/low BP event during admit in 10/21 >> poor candidate for pacer/EPS due to high risk of infection with ESRD; pacer not recommended by EP   CAD (coronary artery disease)    s/p NSTEMI 10/21: cath>> pLAD 99 >> tx with DES; Lat RI 100 and OM1 100 - med Rx   Cataract    removed both eyes    Cholecystitis    dx during admx 10/21 >> HIDA neg // exam benign // high risk for surgery >> no surgical intervention recommended.    Claustrophobia    COPD (chronic obstructive  pulmonary disease) (HCC)    Coronary artery disease    Diabetes mellitus    Type II   Diabetic peripheral neuropathy (HCC)    Diabetic retinopathy ([place])    Discitis of lumbar region    resolved   DVT (deep venous thrombosis) ([place]) 10/07/2015   LLE   Dysrhythmia 07/2020   afib   Endocarditis 11/28/14   ESRD (end stage renal disease) on dialysis [facility])    s/p renal transplant in 2009, failed in 2016   Gait disorder    Gastroparesis    GERD (gastroesophageal reflux disease)    Gout    Hearing difficulty    Hemodialysis patient ([place])    tuesday, thursday, saturday    HFrEF (HF with reduced EF)    Ischemic CM // Echo 10/21: EF 40-45, dist sept and apical AK, mild LVH, normal RVSF, mod MAC   Hyperlipidemia    Hypertension    Incomplete bladder emptying    Leg pain 02/05/11   with walking   Morbid obesity ([place])    Myocardial infarction ([place]) 07/2020   Obstructive sleep apnea    not using CPAP- lost 100lbs   Osteomyelitis (HCC)    Posttraumatic stress disorder    Rotator cuff disorder    Secondary hyperparathyroidism ([place])    Sepsis ([place])    Postive blood cultures -    Sleep apnea    no cpap   Urethral stricture    Vitamin D deficiency     Past Surgical History:  Procedure Laterality Date   ARTERIOVENOUS GRAFT PLACEMENT Bilateral "several"   AV FISTULA PLACEMENT Bilateral 2015   AV FISTULA PLACEMENT Right 06/13/2019   Procedure: INSERTION OF ARTERIOVENOUS (AV) GORE-TEX GRAFT RIGHT  ARM;  Surgeon: Elam Dutch, MD;  Location: [place];  Service: Vascular;  Laterality: Right;   [place] Right 11/08/2020   Procedure: Excision of Foreign Body;  Surgeon: [name], MD;  Location: [place];  Service: Vascular;  Laterality: Right;   [place] Right 54/00/8676   BASCILIC VEIN TRANSPOSITION Right 09/27/2015   Procedure: BASILIC VEIN TRANSPOSITION right;  Surgeon: Mal Misty, MD;  Location: [place];  Service: Vascular;  Laterality: Right;   CARDIAC  CATHETERIZATION  07/2020   CATARACT EXTRACTION W/ INTRAOCULAR LENS  IMPLANT, BILATERAL Bilateral    CORONARY STENT INTERVENTION N/A 07/27/2020   Procedure: CORONARY STENT INTERVENTION;  Surgeon: Burnell Blanks, MD;  Location: [place] CV LAB;  Service: Cardiovascular;  Laterality: N/A;   CORONARY STENT INTERVENTION N/A 02/17/2021   Procedure: CORONARY STENT INTERVENTION;  Surgeon: Burnell Blanks, MD;  Location: [place] CV LAB;  Service: Cardiovascular;  Laterality: N/A;   CYSTOSCOPY W/ INTERNAL URETHROTOMY  09/2014   EXCHANGE OF A DIALYSIS CATHETER Right 02/14/2021   Procedure: EXCHANGE OF A DIALYSIS CATHETER RIGHT INTERNAL JUGULAR;  Surgeon: Serafina Mitchell, MD;  Location: [place];  Service: Vascular;  Laterality: Right;   EYE SURGERY Bilateral 2006   INSERTION OF DIALYSIS CATHETER N/A 11/08/2020   Procedure: INSERTION OF DIALYSIS CATHETER;  Surgeon: [name], MD;  Location: [place];  Service: Vascular;  Laterality: N/A;   IR THROMBECTOMY AV FISTULA W/THROMBOLYSIS/PTA INC/SHUNT/IMG RIGHT Right 08/01/2019   IR US GUIDE VASC ACCESS RIGHT  08/01/2019   KIDNEY TRANSPLANT  2009   LEFT HEART CATH AND CORONARY ANGIOGRAPHY N/A 07/27/2020   Procedure: LEFT HEART CATH AND CORONARY ANGIOGRAPHY;  Surgeon: Burnell Blanks, MD;  Location: [place] CV LAB;  Service: Cardiovascular;  Laterality: N/A;   LEFT HEART CATH AND CORONARY ANGIOGRAPHY N/A 02/17/2021   Procedure: LEFT HEART CATH AND CORONARY ANGIOGRAPHY;  Surgeon: Burnell Blanks, MD;  Location: [place] CV LAB;  Service: Cardiovascular;  Laterality: N/A;   SIGMOIDOSCOPY     UPPER EXTREMITY VENOGRAPHY Bilateral 06/02/2019   Procedure: UPPER EXTREMITY VENOGRAPHY;  Surgeon: Elam Dutch, MD;  Location: [place] CV LAB;  Service: Cardiovascular;  Laterality: Bilateral;   UPPER GASTROINTESTINAL ENDOSCOPY     VASCULAR ACCESS DEVICE INSERTION Right 11/07/2020   Procedure: RIGHT UPPER EXTREMITY HERO GRAFT;   Surgeon: [name], MD;  Location[facility];  Service: Vascular;  Laterality: Right;    Current Outpatient Medications  Medication Sig Dispense Refill   Accu-Chek FastClix Lancets MISC USE TO CHECK BLOOD SUGARS 4 TIMES A DAY. 360 each 4   allopurinol (ZYLOPRIM) 100 MG tablet Take 1 tablet (100 mg total) by mouth 3 (three) times a week. After dialysis 30 tablet 6   amoxicillin (AMOXIL) 500 MG capsule Take 500 mg by mouth 3 (three) times daily.     AURYXIA 1 GM 210 MG(Fe) tablet Take 630 mg by mouth 3 (three) times daily with meals. Takes 3 tablets with meal and 2 tablet with snacks     busPIRone (BUSPAR) 5 MG tablet TAKE 1 TABLET TWICE DAILY AS NEEDED 180 tablet 0   calcium acetate (PHOSLO) 667 MG tablet Take by mouth.     cinacalcet (SENSIPAR) 30 MG tablet Take 30 mg by mouth daily.     clonazePAM (KLONOPIN) 0.5 MG tablet Take 1 tablet (0.5 mg total) by mouth 2 (two) times daily as needed for anxiety. 60 tablet 3   clonazePAM (KLONOPIN) 0.5 MG tablet Take by mouth.     clopidogrel (PLAVIX) 75 MG tablet      diphenhydrAMINE (BENADRYL) 25 MG tablet Take 1 tablet (25 mg total) by mouth every 8 (eight) hours as needed for itching. 10 tablet 0   diphenhydrAMINE (SOMINEX) 25 MG tablet Take by mouth.     glucose blood (ACCU-CHEK GUIDE) test strip USE AS INSTRUCTED TO CHECK BLOOD SUGAR 3 TIMES DAILY AS NEEDED 300 strip 12   HUMULIN R U-500 KWIKPEN 500 UNIT/ML KwikPen INJECT 50 UNITS UNDER THE SKIN AT BREAKFAST, 70 UNITS AT LUNCH, AND 60 UNITS AT DINNER 6 mL 3   hydrocortisone (ANUSOL-HC) 2.5 % rectal cream Place rectally 4 (four) times daily. 30 g 0   hydrocortisone (ANUSOL-HC) 25 MG suppository Place 1 suppository (25 mg total) rectally 2 (two) times daily. 180 suppository 0   insulin glargine, 2 Unit Dial, (TOUJEO MAX SOLOSTAR) 300 UNIT/ML Solostar Pen INJECT 80 UNITS UNDER THE SKIN IN THE MORNING AND 80 UNITS IN THE EVENING 48 mL 1   Insulin Pen Needle (DROPLET PEN NEEDLES) 32G X 4 MM MISC USE  TO INJECT FIVE TIMES DAILY AS DIRECTED 500 each 1   Insulin  Pen Needle 32G X 8 MM MISC Use with insulin x5 times a day 500 each 1   meclizine (ANTIVERT) 12.5 MG tablet Take 1 tablet (12.5 mg total) by mouth 2 (two) times daily as needed for dizziness. 10 tablet 0   Methoxy PEG-Epoetin Beta (MIRCERA IJ) Mircera     metoprolol succinate (TOPROL-XL) 25 MG 24 hr tablet Take 0.5 tablets (12.5 mg total) by mouth at bedtime. 30 tablet 0   midodrine (PROAMATINE) 10 MG tablet Take 10 mg by mouth 3 (three) times daily.     mupirocin ointment (BACTROBAN) 2 % Apply 1 application topically 2 (two) times daily. 30 g 2   omeprazole (PRILOSEC OTC) 20 MG tablet Take by mouth.     pantoprazole (PROTONIX) 40 MG tablet Take 1 tablet (40 mg total) by mouth daily. 90 tablet 3   rosuvastatin (CRESTOR) 20 MG tablet Take by mouth.     senna-docusate (SENOKOT-S) 8.6-50 MG tablet Take 2 tablets by mouth 2 (two) times daily.     sevelamer carbonate (RENVELA) 800 MG tablet Take 2,400-3,200 mg by mouth in the morning, at noon, in the evening, and at bedtime. Takes 4 tablets with meals and 2 tablets with snacks     silver sulfADIAZINE (SILVADENE) 1 % cream Apply 1 application topically daily. 50 g 0   sodium zirconium cyclosilicate (LOKELMA) 10 g PACK packet Take by mouth.     ticagrelor (BRILINTA) 90 MG TABS tablet Take 1 tablet (90 mg total) by mouth 2 (two) times daily. 180 tablet 3   VELTASSA 8.4 g packet Take 8.4 g by mouth daily.      warfarin (COUMADIN) 5 MG tablet TAKE 1 TABLET EVERY DAY OR AS DIRECTED BY COUMADIN CLINIC ADMIN INSTRUCTIONS (Patient taking differently: Take 5 mg by mouth daily.) 100 tablet 1   witch hazel-glycerin (TUCKS) pad Apply topically as needed for itching. 40 each 12   atorvastatin (LIPITOR) 80 MG tablet Take 1 tablet (80 mg total) by mouth daily. (Patient not taking: Reported on 06/09/2021) 90 tablet 3   No current facility-administered medications for this visit.    Allergies  Allergen  Reactions   Ace Inhibitors Cough   Pork-Derived Products Other (See Comments)    Entry determined to be clinically insignificant: OK TO GIVE HEPARIN (has tolerated well in the past)  Patient had allergic episode in 1989 after he ate pork at a fair. He developed fever and rash and went to the ED, but he did not have breathing issues. Received heparin in the past. Patient ok with removing pork allergy and listing as intolerance instead.    Social History   Socioeconomic History   Marital status: Divorced    Spouse name: Not on file   Number of children: 0   Years of education: College    Highest education level: Bachelor's degree (e.g., BA, AB, BS)  Occupational History    Employer: UNEMPLOYED  Tobacco Use   Smoking status: Never   Smokeless tobacco: Never  Vaping Use   Vaping Use: Never used  Substance and Sexual Activity   Alcohol use: No    Alcohol/week: 0.0 standard drinks   Drug use: No   Sexual activity: Not Currently  Other Topics Concern   Not on file  Social History Narrative   Patient is Divorced.   Patient does not have any children.   Patient is right-handed   Patient is currently in college working on his Ph.D.      Social Determinants of  Health   Financial Resource Strain: Not on file  Food Insecurity: Not on file  Transportation Needs: Not on file  Physical Activity: Not on file  Stress: Not on file  Social Connections: Not on file  Intimate Partner Violence: Not on file    Family History  Problem Relation Age of Onset   Hypertension Mother    Diabetes Mother    Diabetes Father    Diabetes Brother    Colon cancer Neg Hx    Colon polyps Neg Hx    Esophageal cancer Neg Hx    Rectal cancer Neg Hx    Stomach cancer Neg Hx     Review of Systems:  As stated in the HPI and otherwise negative.   BP 126/62   Pulse 78   Ht _0  (1.778 m)   Wt 288 lb (130.6 kg)   SpO2 96%   BMI 41.32 kg/m   Physical Examination:  General: Well developed,  well nourished, NAD  HEENT: OP clear, mucus membranes moist  SKIN: warm, dry. No rashes. Neuro: No focal deficits  Musculoskeletal: Muscle strength 5/5 all ext  Psychiatric: Mood and affect normal  Neck: No JVD, no carotid bruits, no thyromegaly, no lymphadenopathy.  Lungs:Clear bilaterally, no wheezes, rhonci, crackles Cardiovascular: Regular rate and rhythm. No murmurs, gallops or rubs. Abdomen:Soft. Bowel sounds present. Non-tender.  Extremities: No lower extremity edema. Pulses are 2 + in the bilateral DP/PT.  Echo 05/28/21 [facility]): The left ventricular size is normal with moderate concentric left  ventricular hypertrophy.  Left ventricular systolic function is mildly reduced; LVEF = 45-50%.  Left ventricular filling pattern is pseudonormal. Difficult wall  motion abnormality but basal to mid inferolatearl wall motion  abnormality.  RV is not well visualized but probably normal in size and function.  There is moderate to severe, eccentric mitral regurgitation that may  be in part due to tethering from the inferolateral wall motion  abnormality.  There is moderate tricuspid regurgitation.  Eelevated PA pressures with a calculated PA pressure of 74 mmHg.  Unable to compare to the prior study dated 11/26/2014. By the report  the LVEF is lower and the MR is worse.   Echo 05/02/21:   1. Septal , apical inferior apical and distal anterior wall hypokinesis  EF similar to echo done 02/15/21. Left ventricular ejection fraction, by  estimation, is 25 to 30%. The left ventricle has severely decreased  function. The left ventricle has no  regional wall motion abnormalities. The left ventricular internal cavity  size was moderately dilated. Left ventricular diastolic parameters are  indeterminate.   2. Right ventricular systolic function is normal. The right ventricular  size is normal. There is moderately elevated pulmonary artery systolic  pressure.   3. Left atrial size  was severely dilated.   4. Right atrial size was moderately dilated.   5. The mitral valve is degenerative. Mild mitral valve regurgitation. No  evidence of mitral stenosis. Severe mitral annular calcification.   6. The aortic valve is tricuspid. There is moderate calcification of the  aortic valve. Aortic valve regurgitation is mild. Mild to moderate aortic  valve sclerosis/calcification is present, without any evidence of aortic  stenosis.   7. The inferior vena cava is normal in size with greater than 50%  respiratory variability, suggesting right atrial pressure of 3 mmHg.   EKG:  EKG is not  ordered today. The ekg ordered today demonstrates   Recent Labs: 02/15/2021: Magnesium 2.2 05/14/2021: ALT 12;  BUN 29; Creatinine, Ser 6.69; Hemoglobin 12.0; Platelets 347; Potassium 4.5; Sodium 135    Wt Readings from Last 3 Encounters:  06/09/21 288 lb (130.6 kg)  04/08/21 288 lb (130.6 kg)  03/17/21 288 lb (130.6 kg)     Other studies Reviewed: Additional studies/ records that were reviewed today include: . Review of the above records demonstrates:    Assessment and Plan:   1. Aortic insufficiency/Mitral insufficiency:  Mild AI by echo 05/02/21. Moderate MR by echo August 2022 at [facility].   2. Hypertensive heart disease with heart failure : BP controlled on Toprol.    3. Morbid obesity: Wt loss is recommended  4. Chronic diastolic CHF: Volume controlled with dialysis.   5. HLD: LDL 31 in May 2022.  Continue statin  6. End stage renal disease: He is on HD.  7.  Atrial flutter, paroxysmal: He is in sinus today by exam. Will continue coumadin. (He is also on this because of prior clotting issues with HD access).   8. CAD without angina: No chest pain suggestive of angina. Continue Brilinta. Continue low dose beta blocker and statin.    9. Ischemic cardiomyopathy/chronic systolic CHF: OINO=67-67% post PCI but now 45-50% by echo at [facility] August 2022. Unable to tolerate  standard medical therapy due to hypotension. Unable to start an Ace-inh/ARB or aldactone. Continue low dose beta blocker.    Current medicines are reviewed at length with the patient today.  The patient does not have concerns regarding medicines.  The following changes have been made:  no change  Labs/ tests ordered today include:   No orders of the defined types were placed in this encounter.    Disposition:   FU with me in months   Signed, Lauree Chandler, MD 06/09/2021 9:14 AM    [facility] [address], [place], [place]  [address] Phone: [phone]; Fax: [phone]

## 2021-06-10 ENCOUNTER — Telehealth: Payer: Self-pay

## 2021-06-10 DIAGNOSIS — N2581 Secondary hyperparathyroidism of renal origin: Secondary | ICD-10-CM | POA: Diagnosis not present

## 2021-06-10 DIAGNOSIS — Z992 Dependence on renal dialysis: Secondary | ICD-10-CM | POA: Diagnosis not present

## 2021-06-10 DIAGNOSIS — N186 End stage renal disease: Secondary | ICD-10-CM | POA: Diagnosis not present

## 2021-06-10 NOTE — Telephone Encounter (Signed)
Attempted to contact patient to schedule a hospital follow up appointment. No answer left a message to return call.

## 2021-06-11 ENCOUNTER — Encounter: Payer: Self-pay | Admitting: Internal Medicine

## 2021-06-11 ENCOUNTER — Ambulatory Visit: Payer: Medicare HMO

## 2021-06-11 ENCOUNTER — Ambulatory Visit (INDEPENDENT_AMBULATORY_CARE_PROVIDER_SITE_OTHER): Payer: Medicare HMO | Admitting: Internal Medicine

## 2021-06-11 ENCOUNTER — Ambulatory Visit: Payer: Medicare HMO | Admitting: Nurse Practitioner

## 2021-06-11 DIAGNOSIS — E861 Hypovolemia: Secondary | ICD-10-CM | POA: Diagnosis not present

## 2021-06-11 DIAGNOSIS — Z794 Long term (current) use of insulin: Secondary | ICD-10-CM | POA: Diagnosis not present

## 2021-06-11 DIAGNOSIS — E1122 Type 2 diabetes mellitus with diabetic chronic kidney disease: Secondary | ICD-10-CM

## 2021-06-11 DIAGNOSIS — Z992 Dependence on renal dialysis: Secondary | ICD-10-CM | POA: Diagnosis not present

## 2021-06-11 DIAGNOSIS — N186 End stage renal disease: Secondary | ICD-10-CM | POA: Diagnosis not present

## 2021-06-11 DIAGNOSIS — I469 Cardiac arrest, cause unspecified: Secondary | ICD-10-CM | POA: Diagnosis not present

## 2021-06-11 DIAGNOSIS — I9589 Other hypotension: Secondary | ICD-10-CM | POA: Diagnosis not present

## 2021-06-11 NOTE — Patient Instructions (Addendum)
We will check on the cardiac rehab if they cannot get you in there we will get physical therapy coming out to the house.

## 2021-06-11 NOTE — Progress Notes (Signed)
   Subjective:   Patient ID: [name] [name], male    DOB: 03-03-1966, 55 y.o.   MRN: [id number]  HPI The patient is a 55 YO man coming in for follow up from the hospital (in for graft for HD access and had PEA arrest after due to low BP and needed CRRT and pressors for several days). He is feeling some weak and still slightly dizzy since leaving hospital. He is taking midodrine to help with BP since leaving hospital. He is wanting to do cardiac rehab to help with strength to make himself better renal transplant candidate.   [facility]  Review of Systems  Constitutional: Negative.   HENT: Negative.    Eyes: Negative.   Respiratory:  Negative for cough, chest tightness and shortness of breath.   Cardiovascular:  Negative for chest pain, palpitations and leg swelling.  Gastrointestinal:  Negative for abdominal distention, abdominal pain, constipation, diarrhea, nausea and vomiting.  Musculoskeletal: Negative.   Skin: Negative.   Neurological:  Positive for dizziness.  Psychiatric/Behavioral: Negative.     Objective:  Physical Exam Constitutional:      Appearance: He is well-developed.  HENT:     Head: Normocephalic and atraumatic.  Cardiovascular:     Rate and Rhythm: Normal rate and regular rhythm.  Pulmonary:     Effort: Pulmonary effort is normal. No respiratory distress.     Breath sounds: Normal breath sounds. No wheezing or rales.  Abdominal:     General: Bowel sounds are normal. There is no distension.     Palpations: Abdomen is soft.     Tenderness: There is no abdominal tenderness. There is no rebound.  Musculoskeletal:     Cervical back: Normal range of motion.  Skin:    General: Skin is warm and dry.  Neurological:     Mental Status: He is alert and oriented to person, place, and time.     Coordination: Coordination abnormal.     Comments: Electric wheelchair for ambulation, can stand and walk a few steps    Vitals:   06/11/21 1021  BP: 130/80   Pulse: 65  Resp: 18  Temp: 98 F (36.7 C)  TempSrc: Oral  SpO2: 97%  Height: 5\' 10"  (1.778 m)    This visit occurred during the SARS-CoV-2 public health emergency.  Safety protocols were in place, including screening questions prior to the visit, additional usage of staff PPE, and extensive cleaning of exam room while observing appropriate contact time as indicated for disinfecting solutions.   Assessment & Plan:

## 2021-06-12 DIAGNOSIS — N186 End stage renal disease: Secondary | ICD-10-CM | POA: Diagnosis not present

## 2021-06-12 DIAGNOSIS — Z992 Dependence on renal dialysis: Secondary | ICD-10-CM | POA: Diagnosis not present

## 2021-06-12 DIAGNOSIS — N2581 Secondary hyperparathyroidism of renal origin: Secondary | ICD-10-CM | POA: Diagnosis not present

## 2021-06-13 ENCOUNTER — Ambulatory Visit: Payer: Medicare HMO | Admitting: Endocrinology

## 2021-06-13 ENCOUNTER — Encounter: Payer: Self-pay | Admitting: Endocrinology

## 2021-06-13 ENCOUNTER — Encounter: Payer: Self-pay | Admitting: Internal Medicine

## 2021-06-13 ENCOUNTER — Telehealth: Payer: Self-pay | Admitting: Endocrinology

## 2021-06-13 NOTE — Assessment & Plan Note (Signed)
With severe hypotension following AVG procedure and PEA arrest then requiring pressors for several days and CRRT. Is taking midodrine now and BP appears stable. No recurrent syncope or arrest.

## 2021-06-13 NOTE — Assessment & Plan Note (Signed)
Denies hypoglycemia episode.

## 2021-06-13 NOTE — Assessment & Plan Note (Signed)
He has now had recurrent PEA arrest which we talked about causes more risk for recurrence in the future. He is having some mild dizziness which has improved some being on midodrine.

## 2021-06-13 NOTE — Assessment & Plan Note (Signed)
BMI > 40 and he is working on weight but not having success due to limited exercise functional capacity.

## 2021-06-13 NOTE — Telephone Encounter (Signed)
Pt just recently got out of the hospital 8-16- 2022 06/05/2021 in [place] he missed his appointment  due to this. Patient is requesting to be seen quicker than what is the first available.  Patient can only be seen Monday Wednesday and Friday. Just needing to know where to put him on the schedule  Pt would like a call back

## 2021-06-14 DIAGNOSIS — N186 End stage renal disease: Secondary | ICD-10-CM | POA: Diagnosis not present

## 2021-06-14 DIAGNOSIS — N2581 Secondary hyperparathyroidism of renal origin: Secondary | ICD-10-CM | POA: Diagnosis not present

## 2021-06-14 DIAGNOSIS — Z992 Dependence on renal dialysis: Secondary | ICD-10-CM | POA: Diagnosis not present

## 2021-06-17 ENCOUNTER — Ambulatory Visit: Payer: Self-pay

## 2021-06-17 DIAGNOSIS — L0390 Cellulitis, unspecified: Secondary | ICD-10-CM | POA: Insufficient documentation

## 2021-06-17 DIAGNOSIS — N2581 Secondary hyperparathyroidism of renal origin: Secondary | ICD-10-CM | POA: Diagnosis not present

## 2021-06-17 DIAGNOSIS — N186 End stage renal disease: Secondary | ICD-10-CM | POA: Diagnosis not present

## 2021-06-17 DIAGNOSIS — Z992 Dependence on renal dialysis: Secondary | ICD-10-CM | POA: Diagnosis not present

## 2021-06-17 NOTE — Telephone Encounter (Signed)
Received fax from [facility] reporting INR of 3.66 on 05/22/21. Unable to use this INR for any dosing changes since it is close to one month old.   [place] and they will draw labs this Thursday and fax results to [facility].   Documented in anticoag encounter but no dosing instructions were given to pt due to result being one month old.

## 2021-06-18 NOTE — Progress Notes (Signed)
Medical treatment/procedure(s) were performed by non-physician practitioner and as supervising physician I was immediately available for consultation/collaboration. I agree with above. Briannia Laba A Carynn Felling, MD  

## 2021-06-19 DIAGNOSIS — N2581 Secondary hyperparathyroidism of renal origin: Secondary | ICD-10-CM | POA: Diagnosis not present

## 2021-06-19 DIAGNOSIS — N186 End stage renal disease: Secondary | ICD-10-CM | POA: Diagnosis not present

## 2021-06-19 DIAGNOSIS — Z992 Dependence on renal dialysis: Secondary | ICD-10-CM | POA: Diagnosis not present

## 2021-06-19 LAB — POCT INR: INR: 3.5 — AB (ref 2.0–3.0)

## 2021-06-21 DIAGNOSIS — Z992 Dependence on renal dialysis: Secondary | ICD-10-CM | POA: Diagnosis not present

## 2021-06-21 DIAGNOSIS — N2581 Secondary hyperparathyroidism of renal origin: Secondary | ICD-10-CM | POA: Diagnosis not present

## 2021-06-21 DIAGNOSIS — N186 End stage renal disease: Secondary | ICD-10-CM | POA: Diagnosis not present

## 2021-06-23 ENCOUNTER — Ambulatory Visit (INDEPENDENT_AMBULATORY_CARE_PROVIDER_SITE_OTHER): Payer: Medicare HMO | Admitting: Podiatry

## 2021-06-23 ENCOUNTER — Encounter: Payer: Self-pay | Admitting: Podiatry

## 2021-06-23 ENCOUNTER — Other Ambulatory Visit: Payer: Self-pay

## 2021-06-23 DIAGNOSIS — L97901 Non-pressure chronic ulcer of unspecified part of unspecified lower leg limited to breakdown of skin: Secondary | ICD-10-CM | POA: Diagnosis not present

## 2021-06-23 DIAGNOSIS — L84 Corns and callosities: Secondary | ICD-10-CM | POA: Diagnosis not present

## 2021-06-23 DIAGNOSIS — E1149 Type 2 diabetes mellitus with other diabetic neurological complication: Secondary | ICD-10-CM | POA: Diagnosis not present

## 2021-06-23 DIAGNOSIS — L97512 Non-pressure chronic ulcer of other part of right foot with fat layer exposed: Secondary | ICD-10-CM

## 2021-06-24 DIAGNOSIS — N2581 Secondary hyperparathyroidism of renal origin: Secondary | ICD-10-CM | POA: Diagnosis not present

## 2021-06-24 DIAGNOSIS — N186 End stage renal disease: Secondary | ICD-10-CM | POA: Diagnosis not present

## 2021-06-24 DIAGNOSIS — Z992 Dependence on renal dialysis: Secondary | ICD-10-CM | POA: Diagnosis not present

## 2021-06-25 ENCOUNTER — Other Ambulatory Visit (INDEPENDENT_AMBULATORY_CARE_PROVIDER_SITE_OTHER): Payer: Medicare HMO

## 2021-06-25 ENCOUNTER — Other Ambulatory Visit: Payer: Self-pay

## 2021-06-25 ENCOUNTER — Ambulatory Visit (INDEPENDENT_AMBULATORY_CARE_PROVIDER_SITE_OTHER): Payer: Medicare HMO

## 2021-06-25 DIAGNOSIS — Z794 Long term (current) use of insulin: Secondary | ICD-10-CM | POA: Diagnosis not present

## 2021-06-25 DIAGNOSIS — E1165 Type 2 diabetes mellitus with hyperglycemia: Secondary | ICD-10-CM

## 2021-06-25 LAB — GLUCOSE, RANDOM: Glucose, Bld: 226 mg/dL — ABNORMAL HIGH (ref 70–99)

## 2021-06-25 NOTE — Progress Notes (Signed)
Subjective: 55 year old male presents the office today for evaluation of calluses, wounds on the right fourth and fifth toes. He states that he is doing much better. He has not had any swelling or redness or any drainage.  Some slight tenderness at times.  The preulcerative areas on the left foot are doing much better.  He also has noted some new superficial areas of skin breakdown in the left leg.  He is not sure how this happened.  Denies any swelling or redness or any drainage.  No fevers or chills.  No other concerns.  Objective: AAO x3, NAD DP/PT pulses palpable but decreased bilaterally, CRT less than 3  Hyperkeratotic lesion the dorsal right fourth and fifth digits.  On the upon debridement of the fifth toe there is no underlying ulceration.  Left fourth toe still 1 small superficial area of skin breakdown with any probing, undermining or tunneling.  No skin edema, erythema or signs of infection.  In the left foot areas of the preulcerative areas on the toes of feet resolved.  There is new superficial area skin breakdown on the anterior medial aspect of the leg with is no edema, erythema, ascending cellulitis.  No fluctuance or crepitation.  No malodor. No pain with calf compression, swelling, warmth, erythema   Assessment: Preulcerative calluses, wounds right fourth and fifth digits; left leg ulcer  Plan: -All treatment options discussed with the patient including all alternatives, risks, complications.  -Sharp debrided loose tissue on the right fourth and fifth toe ulcerations utilizing a #312 with scalpel down to healthy tissue.  Still wound present to right fourth toe was debrided to healthy, granular tissue.  Recommend to continue with daily dressing changes.  Continue Silvadene.  Discussed surgical shoe for offloading -He can use Silvadene on the wounds on the left leg as well. -Monitor for any clinical signs or symptoms of infection and directed to call the office immediately should  any occur or go to the ER.    X-ray right foot next appointment if wound present  Trula Slade DPM

## 2021-06-25 NOTE — Patient Instructions (Signed)
Pre visit review using our clinic review tool, if applicable. No additional management support is needed unless otherwise documented below in the visit note.  No dosing instructions given due to INR result being one wk old.

## 2021-06-25 NOTE — Progress Notes (Signed)
Medical treatment/procedure(s) were performed by non-physician practitioner and as supervising physician I was immediately available for consultation/collaboration. I agree with above. Ailyn Gladd Tamura A Shakaya Bhullar, MD  

## 2021-06-25 NOTE — Telephone Encounter (Signed)
Have not received any additional INR results. [place] at [phone] and spoke with Lavella Hammock who reports last INR was drawn on 9/8 and was 3.5. She will fax to [phone]. She reported INR should be drawn weekly on Wednesdays and resulted every Fri, and faxed to this number every Fri. She will talk to nurses about faxing results consistently and will fax most recent result.  Unable to dose on result that is one week old. Will contact office again on Fri if result for this week is not received.

## 2021-06-26 ENCOUNTER — Telehealth: Payer: Self-pay

## 2021-06-26 DIAGNOSIS — N186 End stage renal disease: Secondary | ICD-10-CM | POA: Diagnosis not present

## 2021-06-26 DIAGNOSIS — N2581 Secondary hyperparathyroidism of renal origin: Secondary | ICD-10-CM | POA: Diagnosis not present

## 2021-06-26 DIAGNOSIS — Z992 Dependence on renal dialysis: Secondary | ICD-10-CM | POA: Diagnosis not present

## 2021-06-26 LAB — FRUCTOSAMINE: Fructosamine: 343 umol/L — ABNORMAL HIGH (ref 0–285)

## 2021-06-26 NOTE — Telephone Encounter (Signed)
VM left for patient to check in and offer to schedule a follow up visit with Palliative care.

## 2021-06-27 ENCOUNTER — Ambulatory Visit (INDEPENDENT_AMBULATORY_CARE_PROVIDER_SITE_OTHER): Payer: Medicare HMO | Admitting: Endocrinology

## 2021-06-27 ENCOUNTER — Telehealth: Payer: Self-pay | Admitting: Internal Medicine

## 2021-06-27 ENCOUNTER — Other Ambulatory Visit: Payer: Self-pay

## 2021-06-27 VITALS — BP 98/60 | HR 82 | Ht 70.0 in | Wt 288.0 lb

## 2021-06-27 DIAGNOSIS — E782 Mixed hyperlipidemia: Secondary | ICD-10-CM | POA: Diagnosis not present

## 2021-06-27 DIAGNOSIS — E1165 Type 2 diabetes mellitus with hyperglycemia: Secondary | ICD-10-CM

## 2021-06-27 DIAGNOSIS — Z794 Long term (current) use of insulin: Secondary | ICD-10-CM | POA: Diagnosis not present

## 2021-06-27 MED ORDER — MECLIZINE HCL 12.5 MG PO TABS
12.5000 mg | ORAL_TABLET | Freq: Every day | ORAL | 0 refills | Status: DC | PRN
Start: 2021-06-27 — End: 2021-09-12

## 2021-06-27 NOTE — Patient Instructions (Signed)
Humulin R U-500 insulin 60  units before breakfast, 60  before lunch and 60  before dinner  Extra 5-10 for sugars over 150

## 2021-06-27 NOTE — Telephone Encounter (Signed)
Refill has been sent to the pharmacy.  

## 2021-06-27 NOTE — Progress Notes (Signed)
Patient ID: [name] [name], male   DOB: 11-18-1965, 55 y.o.   MRN: [id number]           Reason for Appointment: Follow-up for Type 2 Diabetes  History of Present Illness:          Date of diagnosis of type 2 diabetes mellitus : 1990       Background history:  He has been taking insulin since about 1999, previously was on unknown oral agents He had good control with insulin initially but A1c was much higher in 2008 and subsequently has had required larger doses of insulin He has been on Lantus for the last few years along with Novolog,followed by an endocrinologist since 2008 His A1c was 6.3 in 04/2014   Prior to his consultation he was on a regimen of Novolog , 50 units 5 times a day with each meal and snack along with 90 Units 3 times a day of Lantus   Recent history:   INSULIN regimen is :TOUJEO 80 units a.m.-- 80 units evening daily  Humulin R U-500 insulin with KwikPen 50 units before breakfast, 70 before lunch and 70 before dinner    Current blood sugar patterns and problems identified:  His A1c is 6.4 done during his hospitalization in August Fructosamine is 343 and about the same as usual  He has not been seen in follow-up since 11/21  He forgot his blood sugar meter today at home Not clear if he is checking blood sugars consistently at different times and generally does not check after meals Usually has variable blood sugars at all different times and on an average usually high He thinks his blood sugars were higher during his hospitalization when they were giving very little insulin He is taking the same insulin dose as he was last year Although he thinks his fasting readings are fairly good he did have a low sugar of 63 during the night only 1 time  He feels that his blood sugars are usually high at lunchtime even though he has not changed his diet  Again sporadically will check his sugars at dinnertime and these are not significantly higher by history  He is trying to  take his Humulin R before starting to eat Has not forgotten to take his Toujeo twice a day as before He is not aware of using a CGM to monitor his blood sugars even though he was given detailed instructions to order this on the last visit   Dinner at 6 pm, breakfast usually 8 AM  Compliance with the medical regimen: Inconsistent   Glucose monitoring:  done 1-2 times a day         Glucometer: Accu-Chek       Blood Glucose readings by recall   PRE-MEAL Fasting Lunch Dinner Bedtime Overall  Glucose range: 86-120 Around 260 89 + ? 63 hs  Mean/median:        POST-MEAL PC Breakfast PC Lunch PC Dinner  Glucose range:   ?  Mean/median:      Prior  PRE-MEAL Fasting Lunch Dinner Bedtime Overall  Glucose range: 61-224   201-370  177   Mean/median:  194    197        Self-care: The diet that the patient has been following is: tries to limit high-fat meals, usually eating  2-3 meals a day.                    Dietician visit, most recent:2013  Exercise:  unable to do any, In wheelchair  Weight history:  Wt Readings from Last 3 Encounters:  06/27/21 288 lb (130.6 kg)  06/09/21 288 lb (130.6 kg)  04/08/21 288 lb (130.6 kg)    Glycemic control:   Lab Results  Component Value Date   HGBA1C 7.2 (H) 01/14/2021   HGBA1C 6.7 (H) 11/09/2020   HGBA1C 7.2 (H) 08/19/2020   Lab Results  Component Value Date   MICROALBUR 232.9 (H) 06/19/2015   LDLCALC 31 02/14/2021   CREATININE 6.69 (H) 05/14/2021    Lab Results  Component Value Date   FRUCTOSAMINE 343 (H) 06/25/2021   FRUCTOSAMINE 335 (H) 08/19/2020   FRUCTOSAMINE 387 (H) 01/26/2020      Allergies as of 06/27/2021       Reactions   Ace Inhibitors Cough   Pork-derived Products Other (See Comments)   Entry determined to be clinically insignificant: OK TO GIVE HEPARIN (has tolerated well in the past) Patient had allergic episode in 1989 after he ate pork at a fair. He developed fever and rash and went to the  ED, but he did not have breathing issues. Received heparin in the past. Patient ok with removing pork allergy and listing as intolerance instead.        Medication List        Accurate as of June 27, 2021  4:41 PM. If you have any questions, ask your nurse or doctor.          STOP taking these medications    insulin lispro 100 UNIT/ML injection Commonly known as: HUMALOG Stopped by: Elayne Snare, MD       TAKE these medications    Accu-Chek FastClix Lancets Misc USE TO CHECK BLOOD SUGARS 4 TIMES A DAY.   Accu-Chek Guide test strip Generic drug: glucose blood USE AS INSTRUCTED TO CHECK BLOOD SUGAR 3 TIMES DAILY AS NEEDED   amiodarone 200 MG tablet Commonly known as: PACERONE Take 200 mg by mouth daily.   atorvastatin 80 MG tablet Commonly known as: LIPITOR Take 1 tablet (80 mg total) by mouth daily.   Auryxia 1 GM 210 MG(Fe) tablet Generic drug: ferric citrate Take 630 mg by mouth 3 (three) times daily with meals. Takes 3 tablets with meal and 2 tablet with snacks   cinacalcet 30 MG tablet Commonly known as: SENSIPAR Take 30 mg by mouth daily.   clonazePAM 0.5 MG tablet Commonly known as: KLONOPIN Take 1 tablet (0.5 mg total) by mouth 2 (two) times daily as needed for anxiety.   HUMULIN R U-500 KWIKPEN [place] Inject 70 Units into the skin. Inject units at 50 units at Breakfast, 70 units at lunch, and 60 units at dinner   Insulin Pen Needle 32G X 8 MM Misc Use with insulin x5 times a day   Droplet Pen Needles 32G X 4 MM Misc Generic drug: Insulin Pen Needle USE TO INJECT FIVE TIMES DAILY AS DIRECTED   magnesium oxide 400 (240 Mg) MG tablet Commonly known as: MAG-OX Take 400 mg by mouth daily.   meclizine 12.5 MG tablet Commonly known as: ANTIVERT Take 12.5 mg by mouth daily as needed.   midodrine 5 MG tablet Commonly known as: PROAMATINE Take 15 mg by mouth 3 (three) times daily with meals.   MIRCERA IJ Mircera   nitroGLYCERIN 0.4 MG SL  tablet Commonly known as: NITROSTAT Place 0.4 mg under the tongue every 5 (five) minutes as needed for chest pain.   pantoprazole 40 MG tablet Commonly  known as: PROTONIX Take 1 tablet (40 mg total) by mouth daily.   polyethylene glycol 17 g packet Commonly known as: MIRALAX / GLYCOLAX Take 17 g by mouth daily as needed.   sevelamer carbonate 800 MG tablet Commonly known as: RENVELA Take 2,400-3,200 mg by mouth in the morning, at noon, in the evening, and at bedtime. Takes 4 tablets with meals and 2 tablets with snacks   silver sulfADIAZINE 1 % cream Commonly known as: Silvadene Apply 1 application topically daily.   ticagrelor 90 MG Tabs tablet Commonly known as: BRILINTA Take 1 tablet (90 mg total) by mouth 2 (two) times daily.   Toujeo Max SoloStar 300 UNIT/ML Solostar Pen Generic drug: insulin glargine (2 Unit Dial) INJECT 80 UNITS UNDER THE SKIN IN THE MORNING AND 80 UNITS IN THE EVENING What changed:  how much to take how to take this when to take this additional instructions   Veltassa 8.4 g packet Generic drug: patiromer Take 8.4 g by mouth daily.   warfarin 5 MG tablet Commonly known as: COUMADIN Take as directed by the anticoagulation clinic. If you are unsure how to take this medication, talk to your nurse or doctor. Original instructions: TAKE 1 TABLET EVERY DAY OR AS DIRECTED BY COUMADIN CLINIC ADMIN INSTRUCTIONS What changed: See the new instructions.        Allergies:  Allergies  Allergen Reactions   Ace Inhibitors Cough   Pork-Derived Products Other (See Comments)    Entry determined to be clinically insignificant: OK TO GIVE HEPARIN (has tolerated well in the past)  Patient had allergic episode in 1989 after he ate pork at a fair. He developed fever and rash and went to the ED, but he did not have breathing issues. Received heparin in the past. Patient ok with removing pork allergy and listing as intolerance instead.    Past Medical History:   Diagnosis Date   Allergic rhinitis    Allergy    Anemia    Aortic insufficiency    SBE in 2016 >> pt declined AVR // a. Echo 7/16:  Mod LVH, EF 50-55%, mild to mod AI, severe LAE, PASP 57 mmHg (LV ID end diastolic 32.9 mm);  b. Echo 2/17:  Mod LVH, EF 50-55%, mod AI, MAC, mild MR, mod LAE, mild to mod TR, PASP 63 mmHg // Echo 2/21: normal EF, mild MR, mild AI   Atrial fibrillation and flutter (HCC)    chronic anticoag with Warfarin // bradycardia noted during admit 10/21   Bilateral carpal tunnel syndrome    Bradycardia    brady/low BP event during admit in 10/21 >> poor candidate for pacer/EPS due to high risk of infection with ESRD; pacer not recommended by EP   CAD (coronary artery disease)    s/p NSTEMI 10/21: cath>> pLAD 99 >> tx with DES; Lat RI 100 and OM1 100 - med Rx   Cataract    removed both eyes    Cholecystitis    dx during admx 10/21 >> HIDA neg // exam benign // high risk for surgery >> no surgical intervention recommended.    Claustrophobia    COPD (chronic obstructive pulmonary disease) (HCC)    Coronary artery disease    Diabetes mellitus    Type II   Diabetic peripheral neuropathy (HCC)    Diabetic retinopathy ([place])    Discitis of lumbar region    resolved   DVT (deep venous thrombosis) ([place]) 10/07/2015   LLE   Dysrhythmia 07/2020  afib   Endocarditis 11/28/14   ESRD (end stage renal disease) on dialysis [facility])    s/p renal transplant in 2009, failed in 2016   Gait disorder    Gastroparesis    GERD (gastroesophageal reflux disease)    Gout    Hearing difficulty    Hemodialysis patient ([place])    tuesday, thursday, saturday    HFrEF (HF with reduced EF)    Ischemic CM // Echo 10/21: EF 40-45, dist sept and apical AK, mild LVH, normal RVSF, mod MAC   Hyperlipidemia    Hypertension    Incomplete bladder emptying    Leg pain 02/05/11   with walking   Morbid obesity ([place])    Myocardial infarction ([place]) 07/2020   Obstructive sleep apnea    not using CPAP-  lost 100lbs   Osteomyelitis (HCC)    Posttraumatic stress disorder    Rotator cuff disorder    Secondary hyperparathyroidism ([place])    Sepsis ([place])    Postive blood cultures -    Sleep apnea    no cpap   Urethral stricture    Vitamin D deficiency     Past Surgical History:  Procedure Laterality Date   ARTERIOVENOUS GRAFT PLACEMENT Bilateral "several"   AV FISTULA PLACEMENT Bilateral 2015   AV FISTULA PLACEMENT Right 06/13/2019   Procedure: INSERTION OF ARTERIOVENOUS (AV) GORE-TEX GRAFT RIGHT  ARM;  Surgeon: Elam Dutch, MD;  Location: [place];  Service: Vascular;  Laterality: Right;   [place] REMOVAL Right 11/08/2020   Procedure: Excision of Foreign Body;  Surgeon: [name], MD;  Location: [place];  Service: Vascular;  Laterality: Right;   [place] Right 38/07/1750   BASCILIC VEIN TRANSPOSITION Right 09/27/2015   Procedure: BASILIC VEIN TRANSPOSITION right;  Surgeon: Mal Misty, MD;  Location: [place];  Service: Vascular;  Laterality: Right;   CARDIAC CATHETERIZATION  07/2020   CATARACT EXTRACTION W/ INTRAOCULAR LENS  IMPLANT, BILATERAL Bilateral    CORONARY STENT INTERVENTION N/A 07/27/2020   Procedure: CORONARY STENT INTERVENTION;  Surgeon: Burnell Blanks, MD;  Location: [place] CV LAB;  Service: Cardiovascular;  Laterality: N/A;   CORONARY STENT INTERVENTION N/A 02/17/2021   Procedure: CORONARY STENT INTERVENTION;  Surgeon: Burnell Blanks, MD;  Location: [place] CV LAB;  Service: Cardiovascular;  Laterality: N/A;   CYSTOSCOPY W/ INTERNAL URETHROTOMY  09/2014   EXCHANGE OF A DIALYSIS CATHETER Right 02/14/2021   Procedure: EXCHANGE OF A DIALYSIS CATHETER RIGHT INTERNAL JUGULAR;  Surgeon: Serafina Mitchell, MD;  Location: [place];  Service: Vascular;  Laterality: Right;   EYE SURGERY Bilateral 2006   INSERTION OF DIALYSIS CATHETER N/A 11/08/2020   Procedure: INSERTION OF DIALYSIS CATHETER;  Surgeon: [name], MD;  Location: [place];   Service: Vascular;  Laterality: N/A;   IR THROMBECTOMY AV FISTULA W/THROMBOLYSIS/PTA INC/SHUNT/IMG RIGHT Right 08/01/2019   IR US GUIDE VASC ACCESS RIGHT  08/01/2019   KIDNEY TRANSPLANT  2009   LEFT HEART CATH AND CORONARY ANGIOGRAPHY N/A 07/27/2020   Procedure: LEFT HEART CATH AND CORONARY ANGIOGRAPHY;  Surgeon: Burnell Blanks, MD;  Location: [place] CV LAB;  Service: Cardiovascular;  Laterality: N/A;   LEFT HEART CATH AND CORONARY ANGIOGRAPHY N/A 02/17/2021   Procedure: LEFT HEART CATH AND CORONARY ANGIOGRAPHY;  Surgeon: Burnell Blanks, MD;  Location: [place] CV LAB;  Service: Cardiovascular;  Laterality: N/A;   SIGMOIDOSCOPY     UPPER EXTREMITY VENOGRAPHY Bilateral 06/02/2019   Procedure: UPPER EXTREMITY  VENOGRAPHY;  Surgeon: Elam Dutch, MD;  Location: [place] CV LAB;  Service: Cardiovascular;  Laterality: Bilateral;   UPPER GASTROINTESTINAL ENDOSCOPY     VASCULAR ACCESS DEVICE INSERTION Right 11/07/2020   Procedure: RIGHT UPPER EXTREMITY HERO GRAFT;  Surgeon: [name], MD;  [facility];  Service: Vascular;  Laterality: Right;    Family History  Problem Relation Age of Onset   Hypertension Mother    Diabetes Mother    Diabetes Father    Diabetes Brother    Colon cancer Neg Hx    Colon polyps Neg Hx    Esophageal cancer Neg Hx    Rectal cancer Neg Hx    Stomach cancer Neg Hx     Social History:  reports that he has never smoked. He has never used smokeless tobacco. He reports that he does not drink alcohol and does not use drugs.    Review of Systems    ROS   Lipid history: He is on 20 mg Crestor with the following labs    Lab Results  Component Value Date   CHOL 79 02/14/2021   HDL 23 (L) 02/14/2021   LDLCALC 31 02/14/2021   TRIG 125 02/14/2021   CHOLHDL 3.4 02/14/2021            Hypertension: His blood pressure is managed by nephrologist and PCP   RENAL: He has had a renal transplant in 2009 and  he is on  dialysis  Neurological: Has has had  decreased sensation in his feet, also complaining of tingling and burning Previously followed by neurologist  Difficulty ambulation: He has had severe problems with balance and also some weakness in his legs, using a power scooter  Last foot exam showed: Absent monofilament sensation in his feet and toes and extending up to the middle of the lower leg on the left and up to the ankle on the right  LABS:  Lab on 06/25/2021  Component Date Value Ref Range Status   Glucose, Bld 06/25/2021 226 (A) 70 - 99 mg/dL Final   Fructosamine 06/25/2021 343 (A) 0 - 285 umol/L Final   Comment: Published reference interval for apparently healthy subjects between age 65 and 4 is 66 - 285 umol/L and in a poorly controlled diabetic population is 228 - 563 umol/L with a mean of 396 umol/L.   Anti-coag visit on 06/25/2021  Component Date Value Ref Range Status   INR 06/19/2021 3.5 (A) 2.0 - 3.0 Final    Physical Examination:  BP 98/60   Pulse 82   Ht _0  (1.778 m)   Wt 288 lb (130.6 kg)   SpO2 (!) 88%   BMI 41.32 kg/m      ASSESSMENT:  Diabetes type 2, long-standing with obesity  See history of present illness for detailed discussion of his current management, blood sugar patterns and problems identified.  His A1c is recently 6.4  He is on large doses of insulin including basal insulin and Humulin R U-500, 3 times a day  Although his A1c is fairly good and fructosamine is moderately increased but generally in the same range as before or better  Difficult to assess his blood sugar patterns especially since he did not bring his meter  Again he is likely not checking readings after meals and mostly fasting  Currently not adjusting his mealtime dose based on what he is eating  Fasting readings are fairly good, only rare overnight low sugars Appears to have higher readings after  breakfast and before lunch including in the lab  Recent hospital  records were reviewed Apparently having labile blood pressures and has been prescribed ProAmatine  PLAN:  He will try to get the freestyle libre, this was discussed again as before and given him list of DME suppliers to call  recommended today Showed him how the freestyle libre sensor works and patient information to given He will call to see if he needs any help setting it up with the diabetes educator  To check blood sugars more consistently after meals  For now appears to be requiring less insulin at lunchtime and he will take 60 units of the Humulin R with every meal He can adjust this 5 to 10 units higher for larger meals or high Premeal blood sugars over 150 No change in Toujeo   Patient Instructions  Humulin R U-500 insulin 60  units before breakfast, 60  before lunch and 60  before dinner  Extra 5-10 for sugars over 150    Total visit time for evaluation including counseling = 30 minutes  Elayne Snare 06/27/2021, 4:41 PM   Note: This office note was prepared with Dragon voice recognition system technology. Any transcriptional errors that result from this process are unintentional.

## 2021-06-27 NOTE — Telephone Encounter (Signed)
1.Medication Requested: meclizine (ANTIVERT) 12.5 MG tablet  2. Pharmacy (Name, [place], [facility]): [facility] - [place], [address] [address]  Phone:  [phone] Fax:  [phone]   3. On Med List: yes  4. Last Visit with PCP: 08.31.22  5. Next visit date with PCP: 11.30.22   Agent: Please be advised that RX refills may take up to 3 business days. We ask that you follow-up with your pharmacy.

## 2021-06-28 DIAGNOSIS — Z992 Dependence on renal dialysis: Secondary | ICD-10-CM | POA: Diagnosis not present

## 2021-06-28 DIAGNOSIS — N186 End stage renal disease: Secondary | ICD-10-CM | POA: Diagnosis not present

## 2021-06-28 DIAGNOSIS — N2581 Secondary hyperparathyroidism of renal origin: Secondary | ICD-10-CM | POA: Diagnosis not present

## 2021-07-01 DIAGNOSIS — N2581 Secondary hyperparathyroidism of renal origin: Secondary | ICD-10-CM | POA: Diagnosis not present

## 2021-07-01 DIAGNOSIS — N186 End stage renal disease: Secondary | ICD-10-CM | POA: Diagnosis not present

## 2021-07-01 DIAGNOSIS — Z992 Dependence on renal dialysis: Secondary | ICD-10-CM | POA: Diagnosis not present

## 2021-07-02 ENCOUNTER — Telehealth: Payer: Self-pay

## 2021-07-02 NOTE — Telephone Encounter (Signed)
VM left to check in on patient and offer to schedule follow up visit with Palliative care. Call back information provided.

## 2021-07-03 DIAGNOSIS — Z9889 Other specified postprocedural states: Secondary | ICD-10-CM | POA: Diagnosis not present

## 2021-07-03 DIAGNOSIS — I482 Chronic atrial fibrillation, unspecified: Secondary | ICD-10-CM | POA: Diagnosis not present

## 2021-07-03 DIAGNOSIS — I440 Atrioventricular block, first degree: Secondary | ICD-10-CM | POA: Diagnosis not present

## 2021-07-03 DIAGNOSIS — I272 Pulmonary hypertension, unspecified: Secondary | ICD-10-CM | POA: Diagnosis not present

## 2021-07-03 DIAGNOSIS — I484 Atypical atrial flutter: Secondary | ICD-10-CM | POA: Diagnosis not present

## 2021-07-03 DIAGNOSIS — I444 Left anterior fascicular block: Secondary | ICD-10-CM | POA: Diagnosis not present

## 2021-07-03 DIAGNOSIS — Z992 Dependence on renal dialysis: Secondary | ICD-10-CM | POA: Diagnosis not present

## 2021-07-03 DIAGNOSIS — Z79899 Other long term (current) drug therapy: Secondary | ICD-10-CM | POA: Diagnosis not present

## 2021-07-03 DIAGNOSIS — I454 Nonspecific intraventricular block: Secondary | ICD-10-CM | POA: Diagnosis not present

## 2021-07-03 DIAGNOSIS — Z5181 Encounter for therapeutic drug level monitoring: Secondary | ICD-10-CM | POA: Diagnosis not present

## 2021-07-03 DIAGNOSIS — N186 End stage renal disease: Secondary | ICD-10-CM | POA: Diagnosis not present

## 2021-07-03 DIAGNOSIS — I2510 Atherosclerotic heart disease of native coronary artery without angina pectoris: Secondary | ICD-10-CM | POA: Diagnosis not present

## 2021-07-03 DIAGNOSIS — R9431 Abnormal electrocardiogram [ECG] [EKG]: Secondary | ICD-10-CM | POA: Diagnosis not present

## 2021-07-03 DIAGNOSIS — I483 Typical atrial flutter: Secondary | ICD-10-CM | POA: Diagnosis not present

## 2021-07-03 DIAGNOSIS — Z7901 Long term (current) use of anticoagulants: Secondary | ICD-10-CM | POA: Diagnosis not present

## 2021-07-03 DIAGNOSIS — I499 Cardiac arrhythmia, unspecified: Secondary | ICD-10-CM | POA: Diagnosis not present

## 2021-07-03 DIAGNOSIS — I255 Ischemic cardiomyopathy: Secondary | ICD-10-CM | POA: Diagnosis not present

## 2021-07-03 DIAGNOSIS — I120 Hypertensive chronic kidney disease with stage 5 chronic kidney disease or end stage renal disease: Secondary | ICD-10-CM | POA: Diagnosis not present

## 2021-07-04 DIAGNOSIS — N186 End stage renal disease: Secondary | ICD-10-CM | POA: Diagnosis not present

## 2021-07-04 DIAGNOSIS — N2581 Secondary hyperparathyroidism of renal origin: Secondary | ICD-10-CM | POA: Diagnosis not present

## 2021-07-04 DIAGNOSIS — Z992 Dependence on renal dialysis: Secondary | ICD-10-CM | POA: Diagnosis not present

## 2021-07-04 LAB — POCT INR: INR: 3.4 — AB (ref 2.0–3.0)

## 2021-07-05 DIAGNOSIS — Z992 Dependence on renal dialysis: Secondary | ICD-10-CM | POA: Diagnosis not present

## 2021-07-05 DIAGNOSIS — N186 End stage renal disease: Secondary | ICD-10-CM | POA: Diagnosis not present

## 2021-07-05 DIAGNOSIS — N2581 Secondary hyperparathyroidism of renal origin: Secondary | ICD-10-CM | POA: Diagnosis not present

## 2021-07-08 ENCOUNTER — Ambulatory Visit (INDEPENDENT_AMBULATORY_CARE_PROVIDER_SITE_OTHER): Payer: Medicare HMO

## 2021-07-08 DIAGNOSIS — Z992 Dependence on renal dialysis: Secondary | ICD-10-CM | POA: Diagnosis not present

## 2021-07-08 DIAGNOSIS — N186 End stage renal disease: Secondary | ICD-10-CM | POA: Diagnosis not present

## 2021-07-08 DIAGNOSIS — N2581 Secondary hyperparathyroidism of renal origin: Secondary | ICD-10-CM | POA: Diagnosis not present

## 2021-07-08 DIAGNOSIS — Z7901 Long term (current) use of anticoagulants: Secondary | ICD-10-CM | POA: Diagnosis not present

## 2021-07-08 NOTE — Patient Instructions (Signed)
Pre visit review using our clinic review tool, if applicable. No additional management support is needed unless otherwise documented below in the visit note. 

## 2021-07-10 ENCOUNTER — Telehealth: Payer: Self-pay | Admitting: Internal Medicine

## 2021-07-10 DIAGNOSIS — Z992 Dependence on renal dialysis: Secondary | ICD-10-CM | POA: Diagnosis not present

## 2021-07-10 DIAGNOSIS — R2681 Unsteadiness on feet: Secondary | ICD-10-CM | POA: Diagnosis not present

## 2021-07-10 DIAGNOSIS — I38 Endocarditis, valve unspecified: Secondary | ICD-10-CM | POA: Diagnosis not present

## 2021-07-10 DIAGNOSIS — M4627 Osteomyelitis of vertebra, lumbosacral region: Secondary | ICD-10-CM | POA: Diagnosis not present

## 2021-07-10 DIAGNOSIS — N186 End stage renal disease: Secondary | ICD-10-CM | POA: Diagnosis not present

## 2021-07-10 DIAGNOSIS — N2581 Secondary hyperparathyroidism of renal origin: Secondary | ICD-10-CM | POA: Diagnosis not present

## 2021-07-10 DIAGNOSIS — E1142 Type 2 diabetes mellitus with diabetic polyneuropathy: Secondary | ICD-10-CM | POA: Diagnosis not present

## 2021-07-10 DIAGNOSIS — I358 Other nonrheumatic aortic valve disorders: Secondary | ICD-10-CM | POA: Diagnosis not present

## 2021-07-10 LAB — POCT INR: INR: 2.8 (ref 2.0–3.0)

## 2021-07-10 NOTE — Progress Notes (Signed)
Agree with management.  Azalee Weimer J Swayze Kozuch, MD  

## 2021-07-10 NOTE — Telephone Encounter (Signed)
Patient was returning call from [facility] about his lab results for coumadin. Patient states it is ok to leave voicemail if he does not answer phone.   Good callback number is [phone]

## 2021-07-10 NOTE — Telephone Encounter (Signed)
LVM for pt concerning INR testing.

## 2021-07-11 ENCOUNTER — Ambulatory Visit (INDEPENDENT_AMBULATORY_CARE_PROVIDER_SITE_OTHER): Payer: Medicare HMO

## 2021-07-11 DIAGNOSIS — E1122 Type 2 diabetes mellitus with diabetic chronic kidney disease: Secondary | ICD-10-CM | POA: Diagnosis not present

## 2021-07-11 DIAGNOSIS — Z7901 Long term (current) use of anticoagulants: Secondary | ICD-10-CM

## 2021-07-11 DIAGNOSIS — I82502 Chronic embolism and thrombosis of unspecified deep veins of left lower extremity: Secondary | ICD-10-CM

## 2021-07-11 DIAGNOSIS — N186 End stage renal disease: Secondary | ICD-10-CM | POA: Diagnosis not present

## 2021-07-11 DIAGNOSIS — Z992 Dependence on renal dialysis: Secondary | ICD-10-CM | POA: Diagnosis not present

## 2021-07-11 NOTE — Progress Notes (Signed)
Medical screening examination/treatment/procedure(s) were performed by non-physician practitioner and as supervising physician I was immediately available for consultation/collaboration. I agree with above. Yessenia Maillet, MD   

## 2021-07-11 NOTE — Patient Instructions (Signed)
Pre visit review using our clinic review tool, if applicable. No additional management support is needed unless otherwise documented below in the visit note. 

## 2021-07-12 DIAGNOSIS — N2581 Secondary hyperparathyroidism of renal origin: Secondary | ICD-10-CM | POA: Diagnosis not present

## 2021-07-12 DIAGNOSIS — Z992 Dependence on renal dialysis: Secondary | ICD-10-CM | POA: Diagnosis not present

## 2021-07-12 DIAGNOSIS — N186 End stage renal disease: Secondary | ICD-10-CM | POA: Diagnosis not present

## 2021-07-14 ENCOUNTER — Ambulatory Visit (INDEPENDENT_AMBULATORY_CARE_PROVIDER_SITE_OTHER): Payer: Medicare HMO | Admitting: Podiatry

## 2021-07-14 ENCOUNTER — Other Ambulatory Visit: Payer: Self-pay

## 2021-07-14 ENCOUNTER — Encounter: Payer: Self-pay | Admitting: Podiatry

## 2021-07-14 DIAGNOSIS — L84 Corns and callosities: Secondary | ICD-10-CM | POA: Diagnosis not present

## 2021-07-14 DIAGNOSIS — E1149 Type 2 diabetes mellitus with other diabetic neurological complication: Secondary | ICD-10-CM | POA: Diagnosis not present

## 2021-07-14 DIAGNOSIS — E1122 Type 2 diabetes mellitus with diabetic chronic kidney disease: Secondary | ICD-10-CM | POA: Diagnosis not present

## 2021-07-14 DIAGNOSIS — I770 Arteriovenous fistula, acquired: Secondary | ICD-10-CM | POA: Diagnosis not present

## 2021-07-14 DIAGNOSIS — L97512 Non-pressure chronic ulcer of other part of right foot with fat layer exposed: Secondary | ICD-10-CM | POA: Diagnosis not present

## 2021-07-14 DIAGNOSIS — I120 Hypertensive chronic kidney disease with stage 5 chronic kidney disease or end stage renal disease: Secondary | ICD-10-CM | POA: Diagnosis not present

## 2021-07-14 DIAGNOSIS — N186 End stage renal disease: Secondary | ICD-10-CM | POA: Diagnosis not present

## 2021-07-15 ENCOUNTER — Ambulatory Visit: Payer: Medicare HMO | Admitting: Physician Assistant

## 2021-07-15 DIAGNOSIS — N186 End stage renal disease: Secondary | ICD-10-CM | POA: Diagnosis not present

## 2021-07-15 DIAGNOSIS — Z992 Dependence on renal dialysis: Secondary | ICD-10-CM | POA: Diagnosis not present

## 2021-07-15 DIAGNOSIS — N2581 Secondary hyperparathyroidism of renal origin: Secondary | ICD-10-CM | POA: Diagnosis not present

## 2021-07-15 NOTE — Progress Notes (Signed)
Subjective: 55 year old male presents the office today for evaluation of calluses, wounds on the right fourth and fifth toes.  He states that he is doing much better.  The wounds on the left foot are also healed.  He has not had any drainage or any pus or any swelling or redness.  He has no fevers or chills.  No other concerns today no new ulcerations that he reports.   Objective: AAO x3, NAD DP/PT pulses palpable but decreased bilaterally, CRT less than 3  Hyperkeratotic lesion the dorsal right fourth and fifth digits.  Upon debridement of the fourth toe the wound is healed.  On the right fifth toe there is 1 small superficial area of skin breakdown without any probing, undermining or tunneling.  No edema, erythema or signs of infection.  On the left foot there is no open sore.  The wound to the leg is almost completely healed as well.  No edema, erythema. No pain with calf compression, swelling, warmth, erythema   Assessment: Preulcerative calluses, wounds right fourth and fifth digits; left leg ulcer-much improved  Plan: -All treatment options discussed with the patient including all alternatives, risks, complications.  -Sharply debrided hyperkeratotic lesions on the right fourth and fifth toes without any complications or bleeding.  Wounds are almost completely healed.  No signs of infection.  The left foot is also doing well.  Continue with offloading and daily foot inspection. -Monitor for any clinical signs or symptoms of infection and directed to call the office immediately should any occur or go to the ER.  Trula Slade DPM

## 2021-07-16 ENCOUNTER — Telehealth: Payer: Self-pay

## 2021-07-16 ENCOUNTER — Other Ambulatory Visit (INDEPENDENT_AMBULATORY_CARE_PROVIDER_SITE_OTHER): Payer: Medicare HMO

## 2021-07-16 ENCOUNTER — Ambulatory Visit (INDEPENDENT_AMBULATORY_CARE_PROVIDER_SITE_OTHER): Payer: Medicare HMO | Admitting: Nurse Practitioner

## 2021-07-16 ENCOUNTER — Encounter: Payer: Self-pay | Admitting: Nurse Practitioner

## 2021-07-16 VITALS — BP 100/66 | HR 80 | Ht 70.0 in | Wt 288.0 lb

## 2021-07-16 DIAGNOSIS — K921 Melena: Secondary | ICD-10-CM

## 2021-07-16 DIAGNOSIS — K6289 Other specified diseases of anus and rectum: Secondary | ICD-10-CM

## 2021-07-16 DIAGNOSIS — D649 Anemia, unspecified: Secondary | ICD-10-CM | POA: Diagnosis not present

## 2021-07-16 DIAGNOSIS — K5900 Constipation, unspecified: Secondary | ICD-10-CM

## 2021-07-16 LAB — IBC + FERRITIN
Ferritin: 64.9 ng/mL (ref 22.0–322.0)
Iron: 44 ug/dL (ref 42–165)
Saturation Ratios: 12.5 % — ABNORMAL LOW (ref 20.0–50.0)
TIBC: 352.8 ug/dL (ref 250.0–450.0)
Transferrin: 252 mg/dL (ref 212.0–360.0)

## 2021-07-16 LAB — CBC
HCT: 36.7 % — ABNORMAL LOW (ref 39.0–52.0)
Hemoglobin: 11.3 g/dL — ABNORMAL LOW (ref 13.0–17.0)
MCHC: 30.8 g/dL (ref 30.0–36.0)
MCV: 89.3 fl (ref 78.0–100.0)
Platelets: 310 10*3/uL (ref 150.0–400.0)
RBC: 4.11 Mil/uL — ABNORMAL LOW (ref 4.22–5.81)
RDW: 20.9 % — ABNORMAL HIGH (ref 11.5–15.5)
WBC: 5.5 10*3/uL (ref 4.0–10.5)

## 2021-07-16 NOTE — Patient Instructions (Addendum)
RECOMMENDATIONS: Benefiber- 1 tablespoon daily to balance out stool pattern. Use over the counter Preparation H rectal suppository at night for 5 nights.  Contact your cardiologist office to let them know you will be needing a colonoscopy/EGD and if they want to see you in office prior to procedures.  We will also send your cardiologist a request for clearance.  Once you have been cleared by cardiology contact our office to schedule the procedures.  LABS:  Lab work has been ordered for you today. Our lab is located in the basement. Press "B" on the elevator. The lab is located at the first door on the left as you exit the elevator.  HEALTHCARE LAWS AND MY CHART RESULTS: Due to recent changes in healthcare laws, you may see the results of your imaging and laboratory studies on MyChart before your provider has had a chance to review them.   We understand that in some cases there may be results that are confusing or concerning to you. Not all laboratory results come back in the same time frame and the provider may be waiting for multiple results in order to interpret others.  Please give [place] 48 hours in order for your provider to thoroughly review all the results before contacting the office for clarification of your results.   It was great seeing you today! Thank you for entrusting me with your care and choosing [facility].  Noralyn Pick, CRNP  The [place] [facility] providers would like to encourage you to use [facility] to communicate with providers for non-urgent requests or questions.  Due to long hold times on the telephone, sending your provider a message by [facility] may be faster and more efficient way to get a response. Please allow 48 business hours for a response.  Please remember that this is for non-urgent requests/questions. If you are age 57 or older, your body mass index should be between 23-30. Your Body mass index is 41.32 kg/m. If this is out of the aforementioned  range listed, please consider follow up with your Primary Care Provider.  If you are age 45 or younger, your body mass index should be between 19-25. Your Body mass index is 41.32 kg/m. If this is out of the aformentioned range listed, please consider follow up with your Primary Care Provider.

## 2021-07-16 NOTE — Progress Notes (Addendum)
07/16/2021 [name] [name] [id number] Jan 22, 1966   CHIEF COMPLAINT: Diarrhea, constipation and dysphagia   HISTORY OF PRESENT ILLNESS:  [name] [name] 55 year old male with a past medical history of coronary artery disease s/p NSTEMI 07/2020 on Brilinta, s/p DES 07/2020 and 02/2021, atrial fibrillation on Coumadin, DM II, ESRD, failed kidney transplant 2009, DVT and COPD. He is followed by [name] [name]. He was last seen in our office by [name] Newer PA-c on 10/20/2019 due to having diarrhea and rectal pain. Stool cultures were ordered and a future colonoscopy was discussed but was not done. C. Diff toxin was negative.   He presents to our office today as referred by [name] [name] for further evaluation regarding dysphagia. He is a challenging historian. He has some difficulty swallowing food, he is vague with description. He also complains of having alternating diarrhea and constipation.  He has rectal pain after having diarrhea for a day or two which has occurred several times over the past few months. No rectal bleeding. He passed loose black stools a few times 2 to 4 weeks ago. No oral iron or Pepto bismol. He receives IV iron as needed as prescribed by his nephrologist [name] [name].  He reports his Hg level was 11 per labs done this week at the dialysis center. He is now passing hard  balls of stool. He stopped taking Pantoprazole one month ago. No heartburn but has occasional dysphagia. He is taking Pantoprazole 63m QD.   He has a history of endocarditis, atrial fibrillation, CAD, ischemic cardiomyopathy and DVT followed by cardiologist [name] [name]  [facility] and atrial fib with RVR and ruled in for a NSTEMI. Cardiac cath 07/27/20 showed  severe stenosis in the proximal LAD treated with a drug eluting stent. ECHO 07/29/2020 showed LV EF 40 - 45%. He had chest pain in May 2022 and mildly elevated troponin levels. S/P cardiac cath 02/17/21 showed an  occlusion of the LAD beyond the stented segment. A drug eluting stent was placed. Echo 02/15/21 with LVEF=25-30%, moderate LVH, moderate RV dysfunction. Mild MR. Mild AI. Repeat echo 05/02/21 with LVEF=25-30% with hypokinesis of the anterior and apical walls. He underwent left arm fistula placement with PEA cardiac arrest. following the procedure felt to be due to hypercarbia 05/2021. He developed atrial fib with RVR during the hospitalization and had a TEE guided cardio[facility] LVEF=45-50%. Moderate MR.   Labs 01/30/2021: Hg 11.7. Ferritin 54. Cr 10.9. BUN   ECHO 05/02/2021: 1. Septal , apical inferior apical and distal anterior wall hypokinesis EF similar to echo done 02/15/21. Left ventricular ejection fraction, by estimation, is 25 to 30%. The left ventricle has severely decreased function. The left ventricle has no regional wall motion abnormalities. The left ventricular internal cavity size was moderately dilated. Left ventricular diastolic parameters are indeterminate. 2. Right ventricular systolic function is normal. The right ventricular size is normal. There is moderately elevated pulmonary artery systolic pressure. 3. Left atrial size was severely dilated. 4. Right atrial size was moderately dilated. 5. The mitral valve is degenerative. Mild mitral valve regurgitation. No evidence of mitral stenosis. Severe mitral annular calcification. 6. The aortic valve is tricuspid. There is moderate calcification of the aortic valve. Aortic valve regurgitation is mild. Mild to moderate aortic valve sclerosis/calcification is present, without any evidence of aortic stenosis. 7. The inferior vena cava is normal in size with greater than 50% respiratory variability, suggesting right atrial  pressure of 3 mmHg.  EGD 02/03/2010 Versed.  1) Limited evalauation due to inability to adequately sedate     (see above)  2) Large amount of liquid, solid food in stomach suggests      underlying gastroparesis.  Flexible sigmoidoscopy [name] [name] 02/03/2010: -42m Rectal polyp -Mild erythema in the distal rectum  -Retained stool   Past Medical History:  Diagnosis Date   Allergic rhinitis    Allergy    Anemia    Aortic insufficiency    SBE in 2016 >> pt declined AVR // a. Echo 7/16:  Mod LVH, EF 50-55%, mild to mod AI, severe LAE, PASP 57 mmHg (LV ID end diastolic 526.3mm);  b. Echo 2/17:  Mod LVH, EF 50-55%, mod AI, MAC, mild MR, mod LAE, mild to mod TR, PASP 63 mmHg // Echo 2/21: normal EF, mild MR, mild AI   Atrial fibrillation and flutter (HCC)    chronic anticoag with Warfarin // bradycardia noted during admit 10/21   Bilateral carpal tunnel syndrome    Bradycardia    brady/low BP event during admit in 10/21 >> poor candidate for pacer/EPS due to high risk of infection with ESRD; pacer not recommended by EP   CAD (coronary artery disease)    s/p NSTEMI 10/21: cath>> pLAD 99 >> tx with DES; Lat RI 100 and OM1 100 - med Rx   Cataract    removed both eyes    Cholecystitis    dx during admx 10/21 >> HIDA neg // exam benign // high risk for surgery >> no surgical intervention recommended.    Claustrophobia    COPD (chronic obstructive pulmonary disease) (HCC)    Coronary artery disease    Diabetes mellitus    Type II   Diabetic peripheral neuropathy (HCC)    Diabetic retinopathy ([place]    Discitis of lumbar region    resolved   DVT (deep venous thrombosis) ([place] 10/07/2015   LLE   Dysrhythmia 07/2020   afib   Endocarditis 11/28/14   ESRD (end stage renal disease) on dialysis ([facility]    s/p renal transplant in 2009, failed in 2016   Gait disorder    Gastroparesis    GERD (gastroesophageal reflux disease)    Gout    Hearing difficulty    Hemodialysis patient (HJer[place]    tuesday, thursday, saturday    HFrEF (HF with reduced EF)    Ischemic CM // Echo 10/21: EF 40-45, dist sept and apical AK, mild LVH, normal RVSF, mod MAC   Hyperlipidemia    Hypertension     Incomplete bladder emptying    Leg pain 02/05/11   with walking   Morbid obesity ([place]    Myocardial infarction ([place] 07/2020   Obstructive sleep apnea    not using CPAP- lost 100lbs   Osteomyelitis (HCC)    Posttraumatic stress disorder    Rotator cuff disorder    Secondary hyperparathyroidism ([place]    Sepsis ([place]    Postive blood cultures -    Sleep apnea    no cpap   Urethral stricture    Vitamin D deficiency    Past Surgical History:  Procedure Laterality Date   ARTERIOVENOUS GRAFT PLACEMENT Bilateral "several"   AV FISTULA PLACEMENT Bilateral 2015   AV FISTULA PLACEMENT Right 06/13/2019   Procedure: INSERTION OF ARTERIOVENOUS (AV) GORE-TEX GRAFT RIGHT  ARM;  Surgeon: FElam Dutch MD;  [facility];  Service: Vascular;  Laterality: Right;   [place]  11/08/2020   Procedure: Excision of Foreign Body;  Surgeon: [name], MD;  Location: [place];  Service: Vascular;  Laterality: Right;   [place] Right 60/45/4098   BASCILIC VEIN TRANSPOSITION Right 09/27/2015   Procedure: BASILIC VEIN TRANSPOSITION right;  Surgeon: Mal Misty, MD;  Location: [place];  Service: Vascular;  Laterality: Right;   CARDIAC CATHETERIZATION  07/2020   CATARACT EXTRACTION W/ INTRAOCULAR LENS  IMPLANT, BILATERAL Bilateral    CORONARY STENT INTERVENTION N/A 07/27/2020   Procedure: CORONARY STENT INTERVENTION;  Surgeon: Burnell Blanks, MD;  Location: [place] CV LAB;  Service: Cardiovascular;  Laterality: N/A;   CORONARY STENT INTERVENTION N/A 02/17/2021   Procedure: CORONARY STENT INTERVENTION;  Surgeon: Burnell Blanks, MD;  Location: [place] CV LAB;  Service: Cardiovascular;  Laterality: N/A;   CYSTOSCOPY W/ INTERNAL URETHROTOMY  09/2014   EXCHANGE OF A DIALYSIS CATHETER Right 02/14/2021   Procedure: EXCHANGE OF A DIALYSIS CATHETER RIGHT INTERNAL JUGULAR;  Surgeon: Serafina Mitchell, MD;  Location: [place];  Service: Vascular;  Laterality: Right;    EYE SURGERY Bilateral 2006   INSERTION OF DIALYSIS CATHETER N/A 11/08/2020   Procedure: INSERTION OF DIALYSIS CATHETER;  Surgeon: [name], MD;  Location: [place];  Service: Vascular;  Laterality: N/A;   IR THROMBECTOMY AV FISTULA W/THROMBOLYSIS/PTA INC/SHUNT/IMG RIGHT Right 08/01/2019   IR US GUIDE VASC ACCESS RIGHT  08/01/2019   KIDNEY TRANSPLANT  2009   LEFT HEART CATH AND CORONARY ANGIOGRAPHY N/A 07/27/2020   Procedure: LEFT HEART CATH AND CORONARY ANGIOGRAPHY;  Surgeon: Burnell Blanks, MD;  Location: [place] CV LAB;  Service: Cardiovascular;  Laterality: N/A;   LEFT HEART CATH AND CORONARY ANGIOGRAPHY N/A 02/17/2021   Procedure: LEFT HEART CATH AND CORONARY ANGIOGRAPHY;  Surgeon: Burnell Blanks, MD;  Location: [place] CV LAB;  Service: Cardiovascular;  Laterality: N/A;   SIGMOIDOSCOPY     UPPER EXTREMITY VENOGRAPHY Bilateral 06/02/2019   Procedure: UPPER EXTREMITY VENOGRAPHY;  Surgeon: Elam Dutch, MD;  Location: [place] CV LAB;  Service: Cardiovascular;  Laterality: Bilateral;   UPPER GASTROINTESTINAL ENDOSCOPY     VASCULAR ACCESS DEVICE INSERTION Right 11/07/2020   Procedure: RIGHT UPPER EXTREMITY HERO GRAFT;  Surgeon: [name], MD;  Location: [place];  Service: Vascular;  Laterality: Right;    Allergies  Allergen Reactions   Ace Inhibitors Cough   Pork-Derived Products Other (See Comments)    Entry determined to be clinically insignificant: OK TO GIVE HEPARIN (has tolerated well in the past)  Patient had allergic episode in 1989 after he ate pork at a fair. He developed fever and rash and went to the ED, but he did not have breathing issues. Received heparin in the past. Patient ok with removing pork allergy and listing as intolerance instead.      Outpatient Encounter Medications as of 07/16/2021  Medication Sig   Accu-Chek FastClix Lancets MISC USE TO CHECK BLOOD SUGARS 4 TIMES A DAY.   amiodarone (PACERONE) 200 MG tablet Take 200  mg by mouth daily.   atorvastatin (LIPITOR) 80 MG tablet Take 1 tablet (80 mg total) by mouth daily.   AURYXIA 1 GM 210 MG(Fe) tablet Take 630 mg by mouth 3 (three) times daily with meals. Takes 3 tablets with meal and 2 tablet with snacks   cinacalcet (SENSIPAR) 30 MG tablet Take 30 mg by mouth daily.   clonazePAM (KLONOPIN) 0.5 MG tablet Take 1 tablet (0.5 mg total) by mouth  2 (two) times daily as needed for anxiety.   diphenhydrAMINE (SOMINEX) 25 MG tablet Take by mouth.   glucose blood (ACCU-CHEK GUIDE) test strip USE AS INSTRUCTED TO CHECK BLOOD SUGAR 3 TIMES DAILY AS NEEDED   insulin glargine, 2 Unit Dial, (TOUJEO MAX SOLOSTAR) 300 UNIT/ML Solostar Pen INJECT 80 UNITS UNDER THE SKIN IN THE MORNING AND 80 UNITS IN THE EVENING (Patient taking differently: Inject 30 Units into the skin daily.)   Insulin Pen Needle (DROPLET PEN NEEDLES) 32G X 4 MM MISC USE TO INJECT FIVE TIMES DAILY AS DIRECTED   Insulin Pen Needle 32G X 8 MM MISC Use with insulin x5 times a day   Insulin Regular Human (HUMULIN R U-500 KWIKPEN [place]) Inject 70 Units into the skin. Inject units at 50 units at Breakfast, 70 units at lunch, and 60 units at dinner   magnesium oxide (MAG-OX) 400 (240 Mg) MG tablet Take 400 mg by mouth daily.   meclizine (ANTIVERT) 12.5 MG tablet Take 1 tablet (12.5 mg total) by mouth daily as needed.   Methoxy PEG-Epoetin Beta (MIRCERA IJ) Mircera   midodrine (PROAMATINE) 5 MG tablet Take 15 mg by mouth 3 (three) times daily with meals.   nitroGLYCERIN (NITROSTAT) 0.4 MG SL tablet Place 0.4 mg under the tongue every 5 (five) minutes as needed for chest pain.   pantoprazole (PROTONIX) 40 MG tablet Take 1 tablet (40 mg total) by mouth daily.   polyethylene glycol (MIRALAX / GLYCOLAX) 17 g packet Take 17 g by mouth daily as needed.   sevelamer carbonate (RENVELA) 800 MG tablet Take 2,400-3,200 mg by mouth in the morning, at noon, in the evening, and at bedtime. Takes 4 tablets with meals and 2 tablets with  snacks   silver sulfADIAZINE (SILVADENE) 1 % cream Apply 1 application topically daily.   ticagrelor (BRILINTA) 90 MG TABS tablet Take 1 tablet (90 mg total) by mouth 2 (two) times daily.   VELTASSA 8.4 g packet Take 8.4 g by mouth daily.    warfarin (COUMADIN) 5 MG tablet TAKE 1 TABLET EVERY DAY OR AS DIRECTED BY COUMADIN CLINIC ADMIN INSTRUCTIONS (Patient taking differently: Take 5 mg by mouth daily.)   No facility-administered encounter medications on file as of 07/16/2021.   REVIEW OF SYSTEMS:  Gen: Denies fever, sweats or chills. No weight loss.  CV: Denies chest pain, palpitations or edema. Resp: Denies cough, shortness of breath of hemoptysis.  GI: See HPI.  GU: Anuric. ESRD on HD.  MS: Denies joint pain, muscles aches or weakness. Derm: Denies rash, itchiness, skin lesions or unhealing ulcers. Psych: Denies depression, anxiety or memory loss.  Heme: Denies bruising, bleeding. Neuro:  Denies headaches, dizziness or paresthesias. Endo:  + DM II.   PHYSICAL EXAM: BP 100/66   Pulse 80   Ht _0  (1.778 m)   Wt 288 lb (130.6 kg)   BMI 41.32 kg/m  General: 55 year old male presents in a wheel chair in NAD.  Head: Normocephalic and atraumatic. Eyes:  Sclerae non-icteric, conjunctive pink. Ears: Normal auditory acuity. Mouth: Dentition intact. No ulcers or lesions.  Neck: Supple, no lymphadenopathy or thyromegaly.  Lungs: Clear bilaterally to auscultation without wheezes, crackles or rhonchi. Heart: Regular rate and rhythm. No murmur, rub or gallop appreciated.  Abdomen: Soft, nontender, non distended. No masses. No hepatosplenomegaly. Normoactive bowel sounds x 4 quadrants.  Rectal: No external hemorrhoids. Internal hemorrhoids without prolapse. No stool or blood in the rectum. CMA Melissa present during exam.  Musculoskeletal:  Symmetrical with no gross deformities. Skin: Warm and dry. No rash or lesions on visible extremities. Extremities: No edema. Neurological: Alert  oriented x 4, no focal deficits.  Psychological:  Alert and cooperative. Normal mood and affect.  ASSESSMENT AND PLAN:  31) 55 year old male with rectal pain. Internal hemorrhoids on exam, no obvious mass. Flex sig in 2011 identified one hyperplastic rectal polyp.  -Cardiac clearance required prior to scheduling colonoscopy at [facility]. Colonoscopy benefits and risks discussed including risk with sedation, risk of bleeding, perforation and infection  -Patient is at a very high risk for complications with any invasive procedure  -Anusol HC 56m suppository 1 PR Q HS x 5 nights   2) Diarrhea, resolved at this time -Patient to call office if diarrhea recurs   3) History of GERD. Dysphagia. Reported passing black stools for a few days 2 to 4 weeks ago. -[facility]-Patient is at a very high risk for complications with any invasive procedure  -Continue Pantoprazole 482mQD -CBC  4) CAD s/p MI 2021 s/p DES in 07/2020 and 02/2021 on Brilinta. S/P PEA arrest during fistula surgery 05/2021.  -Cardiac clearance to also include Brilinta and Coumadin instructions prior to possible EGD and colonoscopy  5) Atrial fibrillation on Coumadin   6) ESRD on HD, failed past renal transplant   7) Chronic anemia secondary to ESRD, no overt GI bleeding  -CBC, iron panel   CC:  CrHoyt Koch*     [facility] Attending   I agree with the Advanced Practitioner's note, impression and recommendations with the following additions:  I would get a barium swallow w/ tablet test before any EGD. His severely dilated left atrium may be cause of dysphagia.  Pending that and cardiology evaluation we can determine next steps.  CaGatha MayerMD, FAMarval Regal

## 2021-07-16 NOTE — Telephone Encounter (Signed)
Request for surgical clearance:     Endoscopy Procedure  What type of surgery is being performed?     Colonoscopy/EGD  When is this surgery scheduled?     TBD at [facility].  What type of clearance is required ?  Cardiology clearance.  Are there any medications that need to be held prior to surgery and how long? [place] name and name of physician performing surgery?      [facility]  What is your office phone and fax number?      Phone- [phone]  [phone]  Anesthesia type (None, local, MAC, general) ?       MAC

## 2021-07-17 DIAGNOSIS — N2581 Secondary hyperparathyroidism of renal origin: Secondary | ICD-10-CM | POA: Diagnosis not present

## 2021-07-17 DIAGNOSIS — Z992 Dependence on renal dialysis: Secondary | ICD-10-CM | POA: Diagnosis not present

## 2021-07-17 DIAGNOSIS — N186 End stage renal disease: Secondary | ICD-10-CM | POA: Diagnosis not present

## 2021-07-17 LAB — POCT INR: INR: 2.4 (ref 2.0–3.0)

## 2021-07-18 NOTE — Telephone Encounter (Signed)
I informed the patient that recommendation regarding his Coumadin will have to come from Dr. Sharlet Salina who is his PCP.  Overall, he has been doing well since the last time he saw Dr. Angelena Form without any worsening chest pain or worsening dyspnea.  He does not do much exercise however has no problem walking upstairs to his bedroom.  At first he says the GI procedure is urgent given the prolonged or rectal pain, however he says he is willing to wait until November if that is the earliest time he can come off of the [place].  While he is also concerned as he has not had opportunity to start on cardiac rehab.  He really wish to start on cardiac rehab if it is okay by Dr. Angelena Form.  He says he need to strengthen himself and do more exercise.

## 2021-07-18 NOTE — Telephone Encounter (Signed)
PCP prescribing/monitoring warfarin for VTE - recommend anticoag clearance come from managing provider.

## 2021-07-19 DIAGNOSIS — N2581 Secondary hyperparathyroidism of renal origin: Secondary | ICD-10-CM | POA: Diagnosis not present

## 2021-07-19 DIAGNOSIS — Z992 Dependence on renal dialysis: Secondary | ICD-10-CM | POA: Diagnosis not present

## 2021-07-19 DIAGNOSIS — N186 End stage renal disease: Secondary | ICD-10-CM | POA: Diagnosis not present

## 2021-07-21 ENCOUNTER — Telehealth: Payer: Self-pay

## 2021-07-21 ENCOUNTER — Ambulatory Visit (INDEPENDENT_AMBULATORY_CARE_PROVIDER_SITE_OTHER): Payer: Medicare HMO

## 2021-07-21 DIAGNOSIS — Z7901 Long term (current) use of anticoagulants: Secondary | ICD-10-CM | POA: Diagnosis not present

## 2021-07-21 NOTE — Telephone Encounter (Signed)
Message to C. Carlton in [facility].  There is a referral in place from May 2022.

## 2021-07-21 NOTE — Patient Instructions (Addendum)
Pre visit review using our clinic review tool, if applicable. No additional management support is needed unless otherwise documented below in the visit note.   Continue 1 tablet daily except take 1/2 tablet on Wed. Recheck on Wed of next week at dialysis.

## 2021-07-21 NOTE — Telephone Encounter (Signed)
    Patient Name: [name] [name]  DOB: 06-Aug-1966 MRN: [id number]  Primary Cardiologist: Lauree Chandler, MD  Chart reviewed as part of pre-operative protocol coverage. Given past medical history and time since last visit, based on ACC/AHA guidelines, [name] [name] would be at acceptable risk for the planned procedure without further cardiovascular testing.   The patient was advised that if he develops new symptoms prior to surgery to contact our office to arrange for a follow-up visit, and he verbalized understanding.  Will defer coumadin holding time to PCP. Please see recommendation for Brilinta by Dr. Angelena Form, last PCI was on 02/17/2021, would prefer to run the Brilinta for a minimum of 6 month after the last PCI prior to GI procedure. Earliest day he may start holding Brilinta would be 08/21/2021, he will need to hold Brilinta for 5 days prior to the GI procedure and restart as soon as possible after the procedure. (This would make the earliest day for GI procedure be to on or after 08/26/2021)   I will route this recommendation to the requesting party via [place] fax function and remove from pre-op pool.  Please call with questions.  Almyra Deforest, [place] 07/21/2021, 10:39 AM

## 2021-07-21 NOTE — Telephone Encounter (Signed)
Will send FYI to requesting office see notes below about clearance.   I will also send these notes to Dr. Camillia Herter nurse Caren Hazy as MD has placed in side note in the clearance notes though this is not needed for cardiac rehab. Dr. Angelena Form has given the ok for the pt to start cardiac rehab. Please see note where pt had asked if ok to start cardiac rehab as well. Cardiac rehab is not being ordered for cardiac clearance. Will send notes to nurse to set up cardiac rehab for the pt.

## 2021-07-21 NOTE — Progress Notes (Signed)
Medical treatment/procedure(s) were performed by non-physician practitioner and as supervising physician I was immediately available for consultation/collaboration. I agree with above. Aravind Chrismer A Genita Nilsson, MD  

## 2021-07-21 NOTE — Progress Notes (Signed)
Beth, pls contact patient and let him know Dr. Carlean Purl recommended he undergo a barium swallow with tablet prior to pursuing an EGD. Pls schedule him for a barium swallow with tablet. DX: dysphagia. Thx

## 2021-07-21 NOTE — Telephone Encounter (Signed)
Contacted [facility] and spoke with Tiffany on Friday, 10/7, 3 times requesting INR result from lab draw they said they drew on 10/6. Every time the same person answered the phone and states "The result has not been faxed yet for the lab." Without even looking, she said this as soon as she heard who was calling at each encounter.  Contacted [facility] again this morning and spoke with Tiffany who reports the last result she has is form 9/29 and they just received the result on 10/6. Advised this nurse received that result on 9/30 and need the result from the lab drawn on 10/6. She reported again that they do not have the result for that lab back yet. She said she will contact [facility] and request the result and fax it.   Difficulty receiving INR results form [facility] has been ongoing and repetitive. Most results, if received at all, are over 20 days old, which cannot be used to adjust coumadin levels.    Contacted pt and advised difficulties getting results and that he may need to come in for coumadin clinic apts or he could check to see if his insurance will cover a home INR machine. Pt will check with his insurance about the machine and he also reports he has an apt today with the physician at dialysis and he will speak to him about the difficulty with getting the INR results. He asked that a msg be sent to Dr. Sharlet Salina to see if she thinks a home INR machine is a good idea. Advised a msg would be sent.   Contacted [facility] and spoke with the manager, Elberta Fortis, RN, who reports he will speak with Tiffany to see what her  process is in handling the INR results and try to make sure she is faxing them. The result from 10/6 is 2.4 for pts INR. It was in the pts chart and returned on 10/6 electronically to the pts chart. Unable to see those results from this clinic.

## 2021-07-21 NOTE — Telephone Encounter (Signed)
Pt called to report he talked to insurance company and they did not know what type of machine the pt was talking about so they could not give him any information. Advised this nurse is waiting PCP response and if they advise to get an INR machine this nurse will contact the pt's insurance tomorrow. Pt verbalized understanding and was very appreciative.

## 2021-07-21 NOTE — Telephone Encounter (Signed)
Pt returned call. Advised of coumadin dosing. Pt verbalized understanding.

## 2021-07-22 DIAGNOSIS — N186 End stage renal disease: Secondary | ICD-10-CM | POA: Diagnosis not present

## 2021-07-22 DIAGNOSIS — N2581 Secondary hyperparathyroidism of renal origin: Secondary | ICD-10-CM | POA: Diagnosis not present

## 2021-07-22 DIAGNOSIS — Z992 Dependence on renal dialysis: Secondary | ICD-10-CM | POA: Diagnosis not present

## 2021-07-22 NOTE — Telephone Encounter (Signed)
No insurance card is scanned into pt's chart. LVM for pt to return call to get info from card.

## 2021-07-22 NOTE — Telephone Encounter (Signed)
I think it would be beneficial to have accurate and timely INR readings for him so would agree with home monitoring. This has certainly been a recurrent issue for him.

## 2021-07-23 ENCOUNTER — Other Ambulatory Visit: Payer: Self-pay

## 2021-07-23 ENCOUNTER — Telehealth: Payer: Self-pay

## 2021-07-23 ENCOUNTER — Telehealth: Payer: Self-pay | Admitting: *Deleted

## 2021-07-23 DIAGNOSIS — R6889 Other general symptoms and signs: Secondary | ICD-10-CM

## 2021-07-23 DIAGNOSIS — I214 Non-ST elevation (NSTEMI) myocardial infarction: Secondary | ICD-10-CM

## 2021-07-23 DIAGNOSIS — I2510 Atherosclerotic heart disease of native coronary artery without angina pectoris: Secondary | ICD-10-CM

## 2021-07-23 DIAGNOSIS — R1310 Dysphagia, unspecified: Secondary | ICD-10-CM

## 2021-07-23 NOTE — Telephone Encounter (Signed)
Order placed for physical therapy. He wishes to complete cardiac rehab but in order to be eligible he will need physical therapy.  I have called the patient to let him know of this plan.  He voices understanding and agreement. ______________________________________________________________________________________ We are unable to provided one on one for his exercise that I believe he would benefit from with a goal to ambulate at least 200 feet for the 6 minute walk test.  The 6 minute walk test is done pre and post  as a functional assessment for the development of his exercise prescription.    Dr. Angelena Form can give [place] the clearance for him to participate in CR along with HD.  Not for sure how he feels the next day - he may feel fine and bp are within program parameters to exercise. Typically I ask the nephrologist or APP at the dialysis center for patients who come to exercise on the same day as HD.  Since he would come on non HD, Dr. Angelena Form can provide this for [place].  Thanks  Psychologist, clinical, BSN  Cardiac and Training and development officer

## 2021-07-23 NOTE — Telephone Encounter (Signed)
Author: Noralyn Pick, NP Service: Gastroenterology Author Type: Nurse Practitioner  Filed: 07/21/2021  2:20 PM Encounter Date: 07/16/2021 Status: Signed  Editor: Noralyn Pick, NP (Nurse Practitioner)        Eustaquio Maize, pls contact patient and let him know Dr. Carlean Purl recommended he undergo a barium swallow with tablet prior to pursuing an EGD. Pls schedule him for a barium swallow with tablet. DX: dysphagia. Thx

## 2021-07-23 NOTE — Telephone Encounter (Signed)
Spoke with the patient. He agrees to this plan of care. Message to radiology scheduling asking for any location on Monday/Wednesday/or Friday appointment.

## 2021-07-24 DIAGNOSIS — Z992 Dependence on renal dialysis: Secondary | ICD-10-CM | POA: Diagnosis not present

## 2021-07-24 DIAGNOSIS — N2581 Secondary hyperparathyroidism of renal origin: Secondary | ICD-10-CM | POA: Diagnosis not present

## 2021-07-24 DIAGNOSIS — N186 End stage renal disease: Secondary | ICD-10-CM | POA: Diagnosis not present

## 2021-07-25 ENCOUNTER — Ambulatory Visit (INDEPENDENT_AMBULATORY_CARE_PROVIDER_SITE_OTHER): Payer: Medicare HMO

## 2021-07-25 DIAGNOSIS — Z7901 Long term (current) use of anticoagulants: Secondary | ICD-10-CM

## 2021-07-25 LAB — POCT INR: INR: 2.6 (ref 2.0–3.0)

## 2021-07-25 NOTE — Progress Notes (Signed)
Medical treatment/procedure(s) were performed by non-physician practitioner and as supervising physician I was immediately available for consultation/collaboration. I agree with above. Esha Fincher A Khyrie Masi, MD  

## 2021-07-25 NOTE — Telephone Encounter (Signed)
Pt reported he has [employer], ID# [id number], and phone numbers for [facility] are [phone] and the number for providers is [phone]. Advised this nurse will f/u with him next week. Pt appreciative and verbalized understanding.

## 2021-07-25 NOTE — Patient Instructions (Addendum)
Pre visit review using our clinic review tool, if applicable. No additional management support is needed unless otherwise documented below in the visit note.  Continue 1 tablet daily except take 1/2 tablet on Wed. Recheck on Wed of next week at dialysis. Pt verbalized understanding and does not want AVS mailed.

## 2021-07-26 DIAGNOSIS — N2581 Secondary hyperparathyroidism of renal origin: Secondary | ICD-10-CM | POA: Diagnosis not present

## 2021-07-26 DIAGNOSIS — N186 End stage renal disease: Secondary | ICD-10-CM | POA: Diagnosis not present

## 2021-07-26 DIAGNOSIS — Z992 Dependence on renal dialysis: Secondary | ICD-10-CM | POA: Diagnosis not present

## 2021-07-28 NOTE — Telephone Encounter (Signed)
[employer] ans spoke with [employer], reference number [id number]. She reported the pt would need to pay 20% of cost of the machine until his out of pocket amount, $3450, was met totally. Once the total amount is met then [employer] will cover 100% of the machine.   Completed application for INR machine and will have PCP sign tomorrow when in clinic.

## 2021-07-29 DIAGNOSIS — Z992 Dependence on renal dialysis: Secondary | ICD-10-CM | POA: Diagnosis not present

## 2021-07-29 DIAGNOSIS — N186 End stage renal disease: Secondary | ICD-10-CM | POA: Diagnosis not present

## 2021-07-29 DIAGNOSIS — N2581 Secondary hyperparathyroidism of renal origin: Secondary | ICD-10-CM | POA: Diagnosis not present

## 2021-07-30 ENCOUNTER — Other Ambulatory Visit: Payer: Self-pay | Admitting: Endocrinology

## 2021-07-31 DIAGNOSIS — N186 End stage renal disease: Secondary | ICD-10-CM | POA: Diagnosis not present

## 2021-07-31 DIAGNOSIS — N2581 Secondary hyperparathyroidism of renal origin: Secondary | ICD-10-CM | POA: Diagnosis not present

## 2021-07-31 DIAGNOSIS — Z992 Dependence on renal dialysis: Secondary | ICD-10-CM | POA: Diagnosis not present

## 2021-07-31 LAB — POCT INR: INR: 1.6 — AB (ref 2.0–3.0)

## 2021-08-01 ENCOUNTER — Ambulatory Visit (INDEPENDENT_AMBULATORY_CARE_PROVIDER_SITE_OTHER): Payer: Medicare HMO

## 2021-08-01 DIAGNOSIS — Z7901 Long term (current) use of anticoagulants: Secondary | ICD-10-CM | POA: Diagnosis not present

## 2021-08-01 NOTE — Telephone Encounter (Signed)
Order has been signed and faxed.   Advised pt of order and that the company would be contacting him concerning cost. Pt verbalized understanding.

## 2021-08-01 NOTE — Progress Notes (Signed)
Medical treatment/procedure(s) were performed by non-physician practitioner and as supervising physician I was immediately available for consultation/collaboration. I agree with above. Joh Rao A Keyondra Lagrand, MD  

## 2021-08-01 NOTE — Patient Instructions (Addendum)
Pre visit review using our clinic review tool, if applicable. No additional management support is needed unless otherwise documented below in the visit note.   Take 1 1/2 tablets today and tomorrow and then continue 1 tablet daily except take 1/2 tablet on Wed. Recheck on Thursof next week at dialysis.

## 2021-08-02 DIAGNOSIS — N2581 Secondary hyperparathyroidism of renal origin: Secondary | ICD-10-CM | POA: Diagnosis not present

## 2021-08-02 DIAGNOSIS — N186 End stage renal disease: Secondary | ICD-10-CM | POA: Diagnosis not present

## 2021-08-02 DIAGNOSIS — Z992 Dependence on renal dialysis: Secondary | ICD-10-CM | POA: Diagnosis not present

## 2021-08-05 DIAGNOSIS — N186 End stage renal disease: Secondary | ICD-10-CM | POA: Diagnosis not present

## 2021-08-05 DIAGNOSIS — Z992 Dependence on renal dialysis: Secondary | ICD-10-CM | POA: Diagnosis not present

## 2021-08-05 DIAGNOSIS — N2581 Secondary hyperparathyroidism of renal origin: Secondary | ICD-10-CM | POA: Diagnosis not present

## 2021-08-06 ENCOUNTER — Other Ambulatory Visit (INDEPENDENT_AMBULATORY_CARE_PROVIDER_SITE_OTHER): Payer: Medicare HMO

## 2021-08-06 ENCOUNTER — Other Ambulatory Visit: Payer: Self-pay

## 2021-08-06 DIAGNOSIS — Z794 Long term (current) use of insulin: Secondary | ICD-10-CM

## 2021-08-06 DIAGNOSIS — E782 Mixed hyperlipidemia: Secondary | ICD-10-CM | POA: Diagnosis not present

## 2021-08-06 DIAGNOSIS — E1165 Type 2 diabetes mellitus with hyperglycemia: Secondary | ICD-10-CM | POA: Diagnosis not present

## 2021-08-06 LAB — LIPID PANEL
Cholesterol: 132 mg/dL (ref 0–200)
HDL: 32.1 mg/dL — ABNORMAL LOW (ref >39.00)
LDL Cholesterol: 76 mg/dL (ref 0–99)
NonHDL: 99.91
Total CHOL/HDL Ratio: 4
Triglycerides: 119 mg/dL (ref 0.0–149.0)
VLDL: 23.8 mg/dL (ref 0.0–40.0)

## 2021-08-06 LAB — HEMOGLOBIN A1C: Hgb A1c MFr Bld: 7.2 % — ABNORMAL HIGH (ref 4.6–6.5)

## 2021-08-06 LAB — GLUCOSE, RANDOM: Glucose, Bld: 69 mg/dL — ABNORMAL LOW (ref 70–99)

## 2021-08-06 NOTE — Telephone Encounter (Signed)
Received fax response from sending order on 10/21 that fax did not go through because it was busy.  Refaxed to that number and also [phone], which is number on order form.

## 2021-08-07 DIAGNOSIS — N2581 Secondary hyperparathyroidism of renal origin: Secondary | ICD-10-CM | POA: Diagnosis not present

## 2021-08-07 DIAGNOSIS — N186 End stage renal disease: Secondary | ICD-10-CM | POA: Diagnosis not present

## 2021-08-07 DIAGNOSIS — Z992 Dependence on renal dialysis: Secondary | ICD-10-CM | POA: Diagnosis not present

## 2021-08-08 LAB — POCT INR: INR: 4.3 — AB (ref 2.0–3.0)

## 2021-08-09 DIAGNOSIS — E1142 Type 2 diabetes mellitus with diabetic polyneuropathy: Secondary | ICD-10-CM | POA: Diagnosis not present

## 2021-08-09 DIAGNOSIS — I358 Other nonrheumatic aortic valve disorders: Secondary | ICD-10-CM | POA: Diagnosis not present

## 2021-08-09 DIAGNOSIS — I38 Endocarditis, valve unspecified: Secondary | ICD-10-CM | POA: Diagnosis not present

## 2021-08-09 DIAGNOSIS — Z992 Dependence on renal dialysis: Secondary | ICD-10-CM | POA: Diagnosis not present

## 2021-08-09 DIAGNOSIS — R2681 Unsteadiness on feet: Secondary | ICD-10-CM | POA: Diagnosis not present

## 2021-08-09 DIAGNOSIS — N2581 Secondary hyperparathyroidism of renal origin: Secondary | ICD-10-CM | POA: Diagnosis not present

## 2021-08-09 DIAGNOSIS — N186 End stage renal disease: Secondary | ICD-10-CM | POA: Diagnosis not present

## 2021-08-09 DIAGNOSIS — M4627 Osteomyelitis of vertebra, lumbosacral region: Secondary | ICD-10-CM | POA: Diagnosis not present

## 2021-08-11 ENCOUNTER — Ambulatory Visit: Payer: Medicare HMO | Admitting: Endocrinology

## 2021-08-11 DIAGNOSIS — Z992 Dependence on renal dialysis: Secondary | ICD-10-CM | POA: Diagnosis not present

## 2021-08-11 DIAGNOSIS — N186 End stage renal disease: Secondary | ICD-10-CM | POA: Diagnosis not present

## 2021-08-11 DIAGNOSIS — E1122 Type 2 diabetes mellitus with diabetic chronic kidney disease: Secondary | ICD-10-CM | POA: Diagnosis not present

## 2021-08-12 ENCOUNTER — Ambulatory Visit (INDEPENDENT_AMBULATORY_CARE_PROVIDER_SITE_OTHER): Payer: Medicare HMO

## 2021-08-12 DIAGNOSIS — Z7901 Long term (current) use of anticoagulants: Secondary | ICD-10-CM | POA: Diagnosis not present

## 2021-08-12 DIAGNOSIS — N2581 Secondary hyperparathyroidism of renal origin: Secondary | ICD-10-CM | POA: Diagnosis not present

## 2021-08-12 DIAGNOSIS — N186 End stage renal disease: Secondary | ICD-10-CM | POA: Diagnosis not present

## 2021-08-12 DIAGNOSIS — Z992 Dependence on renal dialysis: Secondary | ICD-10-CM | POA: Diagnosis not present

## 2021-08-12 NOTE — Patient Instructions (Signed)
Pre visit review using our clinic review tool, if applicable. No additional management support is needed unless otherwise documented below in the visit note. 

## 2021-08-12 NOTE — Telephone Encounter (Signed)
Pt reports he has been contacted by [employer] and as far as he knows his insurance will pay for it. He is waiting for the machine to be delivered and then he will call them to come out for training. Advised when he is set up to let this nurse know. Pt verbalized understanding and was very appreciative.

## 2021-08-12 NOTE — Progress Notes (Signed)
Medical treatment/procedure(s) were performed by non-physician practitioner and as supervising physician I was immediately available for consultation/collaboration. I agree with above. Renada Cronin A Travers Goodley, MD  

## 2021-08-12 NOTE — Progress Notes (Signed)
Hold dose today and hold dose tomorrow and then change weekly dose to take 1 tablet daily except take 1/2 tablet on Mon, Wed, and Fri. Recheck on Thurs of next week at dialysis.

## 2021-08-13 ENCOUNTER — Telehealth (HOSPITAL_COMMUNITY): Payer: Self-pay

## 2021-08-13 ENCOUNTER — Encounter (HOSPITAL_COMMUNITY): Payer: Self-pay

## 2021-08-13 ENCOUNTER — Ambulatory Visit: Payer: Medicare HMO | Admitting: Endocrinology

## 2021-08-13 NOTE — Telephone Encounter (Signed)
Attempted to call patient in regards to Cardiac Rehab - LM on VM Mailed letter 

## 2021-08-13 NOTE — Telephone Encounter (Signed)
Pt returned phone call and stated that he wanted to schedule for cardiac rehab. I advised pt of the orientation days pt stated that he was unable to do Tue and Thurs because of dialysis. I advised pt that those are the only days we do orientation. Pt stated hat he is going to call his dialysis clinic to see if he is able to come in the evening time to do his dialysis on the day of his orientation. I advised pt to call back. From reading the notes on pt chart pt is encouraged to complete PT before he comes to cardiac rehab pt stated that they called him once but didn't schedule  him I advised pt to call PT back and see if he is able to get scheduled pt understood and stated that he will call us back.

## 2021-08-13 NOTE — Telephone Encounter (Signed)
Pt insurance is active and benefits verified through [facility] Co-pay 0, DED $233/$233 met, out of pocket $3,450/$3,450 met, co-insurance 20%. no pre-authorization required. Passport, 08/13/2021@2 :24pm, REF# [id number]   Pt insurance is active through Medicaid. Ref# [id number]   Will fax over [facility] Reimbursement form to Dr. Angelena Form   Will contact patient to see if he is interested in the Cardiac Rehab Program. If interested, patient will need to complete follow up appt. Once completed, patient will be contacted for scheduling upon review by the RN Navigator.

## 2021-08-14 DIAGNOSIS — N2581 Secondary hyperparathyroidism of renal origin: Secondary | ICD-10-CM | POA: Diagnosis not present

## 2021-08-14 DIAGNOSIS — Z992 Dependence on renal dialysis: Secondary | ICD-10-CM | POA: Diagnosis not present

## 2021-08-14 DIAGNOSIS — N186 End stage renal disease: Secondary | ICD-10-CM | POA: Diagnosis not present

## 2021-08-14 NOTE — Telephone Encounter (Signed)
Pt reports he was contacted by [employer] and he is waiting for the machine to be delivered and then they will set him up with training.

## 2021-08-15 ENCOUNTER — Ambulatory Visit: Payer: Medicare HMO | Admitting: Podiatry

## 2021-08-16 DIAGNOSIS — N2581 Secondary hyperparathyroidism of renal origin: Secondary | ICD-10-CM | POA: Diagnosis not present

## 2021-08-16 DIAGNOSIS — N186 End stage renal disease: Secondary | ICD-10-CM | POA: Diagnosis not present

## 2021-08-16 DIAGNOSIS — Z992 Dependence on renal dialysis: Secondary | ICD-10-CM | POA: Diagnosis not present

## 2021-08-19 DIAGNOSIS — N186 End stage renal disease: Secondary | ICD-10-CM | POA: Diagnosis not present

## 2021-08-19 DIAGNOSIS — N2581 Secondary hyperparathyroidism of renal origin: Secondary | ICD-10-CM | POA: Diagnosis not present

## 2021-08-19 DIAGNOSIS — Z992 Dependence on renal dialysis: Secondary | ICD-10-CM | POA: Diagnosis not present

## 2021-08-21 DIAGNOSIS — N186 End stage renal disease: Secondary | ICD-10-CM | POA: Diagnosis not present

## 2021-08-21 DIAGNOSIS — N2581 Secondary hyperparathyroidism of renal origin: Secondary | ICD-10-CM | POA: Diagnosis not present

## 2021-08-21 DIAGNOSIS — Z992 Dependence on renal dialysis: Secondary | ICD-10-CM | POA: Diagnosis not present

## 2021-08-21 LAB — POCT INR: INR: 2.8 (ref 2.0–3.0)

## 2021-08-22 ENCOUNTER — Telehealth: Payer: Self-pay | Admitting: Endocrinology

## 2021-08-22 ENCOUNTER — Ambulatory Visit (INDEPENDENT_AMBULATORY_CARE_PROVIDER_SITE_OTHER): Payer: Medicare HMO

## 2021-08-22 DIAGNOSIS — Z7901 Long term (current) use of anticoagulants: Secondary | ICD-10-CM | POA: Diagnosis not present

## 2021-08-22 DIAGNOSIS — E1165 Type 2 diabetes mellitus with hyperglycemia: Secondary | ICD-10-CM

## 2021-08-22 DIAGNOSIS — I82502 Chronic embolism and thrombosis of unspecified deep veins of left lower extremity: Secondary | ICD-10-CM

## 2021-08-22 DIAGNOSIS — Z794 Long term (current) use of insulin: Secondary | ICD-10-CM

## 2021-08-22 MED ORDER — ACCU-CHEK GUIDE VI STRP: ORAL_STRIP | 12 refills | Status: AC

## 2021-08-22 NOTE — Telephone Encounter (Signed)
MEDICATION: glucose blood (ACCU-CHEK GUIDE) test strip  PHARMACY:   [place], [place] Phone:  [phone]  Fax:  [phone]      HAS THE PATIENT CONTACTED THEIR PHARMACY?  YES REQUIRES NEW RX  IS THIS A 90 DAY SUPPLY : YES  IS PATIENT OUT OF MEDICATION: YES  IF NOT; HOW MUCH IS LEFT: ZERO  LAST APPOINTMENT DATE: @10 /19/2022  NEXT APPOINTMENT DATE:@11 /16/2022  DO WE HAVE YOUR PERMISSION TO LEAVE A DETAILED MESSAGE?:  OTHER COMMENTS:    **Let patient know to contact pharmacy at the end of the day to make sure medication is ready. **  ** Please notify patient to allow 48-72 hours to process**  **Encourage patient to contact the pharmacy for refills or they can request refills through [facility]**

## 2021-08-22 NOTE — Progress Notes (Signed)
Continue to take 1 tablet daily except take 1/2 tablet on Mon, Wed, and Fri. Recheck on Thurs of next week at dialysis. Pt verbalized understanding and did not want AVS mailed.

## 2021-08-22 NOTE — Telephone Encounter (Signed)
Rx sent to preferred pharmacy.

## 2021-08-22 NOTE — Patient Instructions (Addendum)
Pre visit review using our clinic review tool, if applicable. No additional management support is needed unless otherwise documented below in the visit note.  Continue to take 1 tablet daily except take 1/2 tablet on Mon, Wed, and Fri. Recheck on Thurs of next week at dialysis. Pt verbalized understanding and did not want AVS mailed.

## 2021-08-23 DIAGNOSIS — N186 End stage renal disease: Secondary | ICD-10-CM | POA: Diagnosis not present

## 2021-08-23 DIAGNOSIS — Z992 Dependence on renal dialysis: Secondary | ICD-10-CM | POA: Diagnosis not present

## 2021-08-23 DIAGNOSIS — N2581 Secondary hyperparathyroidism of renal origin: Secondary | ICD-10-CM | POA: Diagnosis not present

## 2021-08-25 ENCOUNTER — Other Ambulatory Visit: Payer: Self-pay

## 2021-08-25 ENCOUNTER — Ambulatory Visit (HOSPITAL_COMMUNITY)
Admission: RE | Admit: 2021-08-25 | Discharge: 2021-08-25 | Disposition: A | Discharge status: Home / Self Care | Payer: Medicare HMO | Source: Ambulatory Visit | Attending: Nurse Practitioner | Admitting: Nurse Practitioner

## 2021-08-25 DIAGNOSIS — R1110 Vomiting, unspecified: Secondary | ICD-10-CM | POA: Diagnosis not present

## 2021-08-25 DIAGNOSIS — N186 End stage renal disease: Secondary | ICD-10-CM | POA: Diagnosis not present

## 2021-08-25 DIAGNOSIS — Z452 Encounter for adjustment and management of vascular access device: Secondary | ICD-10-CM | POA: Diagnosis not present

## 2021-08-25 DIAGNOSIS — Z992 Dependence on renal dialysis: Secondary | ICD-10-CM | POA: Diagnosis not present

## 2021-08-25 DIAGNOSIS — R1310 Dysphagia, unspecified: Secondary | ICD-10-CM | POA: Diagnosis not present

## 2021-08-25 NOTE — Telephone Encounter (Signed)
[employer] reports pt needs to sign and return form that will allow them to begin teaching for the home INR machine.  LVM

## 2021-08-26 DIAGNOSIS — N186 End stage renal disease: Secondary | ICD-10-CM | POA: Diagnosis not present

## 2021-08-26 DIAGNOSIS — N2581 Secondary hyperparathyroidism of renal origin: Secondary | ICD-10-CM | POA: Diagnosis not present

## 2021-08-26 DIAGNOSIS — Z992 Dependence on renal dialysis: Secondary | ICD-10-CM | POA: Diagnosis not present

## 2021-08-26 NOTE — Telephone Encounter (Signed)
LVM

## 2021-08-27 ENCOUNTER — Ambulatory Visit: Payer: Medicare HMO | Admitting: Endocrinology

## 2021-08-28 DIAGNOSIS — N2581 Secondary hyperparathyroidism of renal origin: Secondary | ICD-10-CM | POA: Diagnosis not present

## 2021-08-28 DIAGNOSIS — Z992 Dependence on renal dialysis: Secondary | ICD-10-CM | POA: Diagnosis not present

## 2021-08-28 DIAGNOSIS — N186 End stage renal disease: Secondary | ICD-10-CM | POA: Diagnosis not present

## 2021-08-29 ENCOUNTER — Ambulatory Visit (INDEPENDENT_AMBULATORY_CARE_PROVIDER_SITE_OTHER): Payer: Medicare HMO

## 2021-08-29 ENCOUNTER — Ambulatory Visit (INDEPENDENT_AMBULATORY_CARE_PROVIDER_SITE_OTHER): Payer: Medicare HMO | Admitting: Podiatry

## 2021-08-29 ENCOUNTER — Other Ambulatory Visit: Payer: Self-pay | Admitting: Internal Medicine

## 2021-08-29 ENCOUNTER — Other Ambulatory Visit: Payer: Self-pay

## 2021-08-29 VITALS — Temp 97.9°F

## 2021-08-29 DIAGNOSIS — E1149 Type 2 diabetes mellitus with other diabetic neurological complication: Secondary | ICD-10-CM | POA: Diagnosis not present

## 2021-08-29 DIAGNOSIS — Z7901 Long term (current) use of anticoagulants: Secondary | ICD-10-CM | POA: Diagnosis not present

## 2021-08-29 DIAGNOSIS — L97512 Non-pressure chronic ulcer of other part of right foot with fat layer exposed: Secondary | ICD-10-CM

## 2021-08-29 DIAGNOSIS — I82502 Chronic embolism and thrombosis of unspecified deep veins of left lower extremity: Secondary | ICD-10-CM

## 2021-08-29 LAB — POCT INR: INR: 4.2 — AB (ref 2.0–3.0)

## 2021-08-29 NOTE — Telephone Encounter (Signed)
Tried to contact pt but no answer and VM is full.  

## 2021-08-29 NOTE — Patient Instructions (Addendum)
Pre visit review using our clinic review tool, if applicable. No additional management support is needed unless otherwise documented below in the visit note.  Hold dose today and hold dose tomorrow and then change weekly dose to take 1/2 tablet daily except take 1 tablet on Tues and Sat. Recheck in 1 week.

## 2021-08-29 NOTE — Progress Notes (Addendum)
Hold dose today and hold dose tomorrow and then change weekly dose to take 1/2 tablet daily except take 1 tablet on Tues and Sat. Recheck in 1 week.   ======================= Agree.  Thanks.  Elsie Stain

## 2021-08-30 DIAGNOSIS — N186 End stage renal disease: Secondary | ICD-10-CM | POA: Diagnosis not present

## 2021-08-30 DIAGNOSIS — Z992 Dependence on renal dialysis: Secondary | ICD-10-CM | POA: Diagnosis not present

## 2021-08-30 DIAGNOSIS — N2581 Secondary hyperparathyroidism of renal origin: Secondary | ICD-10-CM | POA: Diagnosis not present

## 2021-09-01 ENCOUNTER — Encounter (HOSPITAL_COMMUNITY): Payer: Self-pay

## 2021-09-01 NOTE — Telephone Encounter (Signed)
Contacted pt who reports he is not sure what form the trainer with [employer] home INR monitoring needs. He said he spoke with the trainer and he is waiting for her. He gave phone number for trainer as [phone]. Advised this nurse will try to f/u with trainer to see what is needed. Advised if he was contacted by [employer] ton contact this nurse. Pt verbalized understanding.   Tried to contact trainer but no answer and VM is full at number provided.

## 2021-09-01 NOTE — Telephone Encounter (Signed)
Contacted [employer] rep, Cathy, for update to order. She is not sure but will look into it and contact this nurse.

## 2021-09-01 NOTE — Telephone Encounter (Signed)
Attempted to call patient in regards to Cardiac Rehab - unable to leave VM, VM box full. °  °Mailed letter °

## 2021-09-01 NOTE — Progress Notes (Signed)
Subjective: 55 year old male presents the office today for evaluation of calluses, wounds on the right fourth and fifth toes.  He states he has been doing well.  He has not seen any drainage or pus.  No increase in swelling or redness to his feet.  No other concerns.  No fevers or chills that he reports.   Objective: AAO x3, NAD DP/PT pulses palpable but decreased bilaterally, CRT less than 3  Hyperkeratotic lesion the dorsal right fourth and fifth digits.  Overall patient is almost healed.  Today there is a new wound present on the medial aspect of the right fifth toe to the level of the fat layer there is no exposed bone or tendon.  Mild macerated tissue.  There is no surrounding erythema, ascending cellulitis.  No fluctuance or crepitation but there is no malodor. There is some dried blood present in between the toes on the left foot there is no open sore identified today. Wound on left leg improving without any signs of infection. No pain with calf compression, swelling, warmth, erythema   Assessment: Preulcerative calluses, wounds right fourth and fifth digits  Plan: -All treatment options discussed with the patient including all alternatives, risks, complications.  -Sharply debrided hyperkeratotic lesions on the right fourth and fifth toes without any complications or bleeding.  Wounds are almost healed only superficial area of skin breakdown identified today. Present however from the medial aspect the right fifth toe.  No signs of infection.  The left foot is also doing well.  Continue with offloading and daily foot inspection. -Continue daily dressing changes,  Continue offloading. -Monitor for any clinical signs or symptoms of infection and directed to call the office immediately should any occur or go to the ER.  Trula Slade DPM

## 2021-09-02 ENCOUNTER — Telehealth (HOSPITAL_COMMUNITY): Payer: Self-pay

## 2021-09-02 DIAGNOSIS — N186 End stage renal disease: Secondary | ICD-10-CM | POA: Diagnosis not present

## 2021-09-02 DIAGNOSIS — Z992 Dependence on renal dialysis: Secondary | ICD-10-CM | POA: Diagnosis not present

## 2021-09-02 DIAGNOSIS — N2581 Secondary hyperparathyroidism of renal origin: Secondary | ICD-10-CM | POA: Diagnosis not present

## 2021-09-02 NOTE — Telephone Encounter (Signed)
Pt called and wanting to schedule for cardiac rehab. Placed pt on hold while getting pt paperwork and pt phone disconnected. Tried calling pt back but the VM box is full and unable to leave a message

## 2021-09-02 NOTE — Telephone Encounter (Signed)
[name] at [place],  On 11/16 the trainer did perform a virtual training with [name] [name] and found it to be unsuccessful. The trainer notated they believed the patient's eyesight may have been a challenge. Per [name], agreed to visiting RN be arranged and or in process for assistance. They discussed reconnecting the next day for someone to help with the training. This was also approved by the training team and the notation states the customer has not returned any of the calls since the training session. Solution: Either have the patient contact trainer/[employer] directly to let the trainer know they have agreed to have someone there to help with the training.  Contacted pt and advised he needs to f/u with trainer. Pt reports he has and the trainer is supposed to call him this afternoon. Advised if he is not contacted to call this nurse. Pt verbalized understanding and was appreciative for f/u.

## 2021-09-03 DIAGNOSIS — H524 Presbyopia: Secondary | ICD-10-CM | POA: Diagnosis not present

## 2021-09-03 DIAGNOSIS — H52223 Regular astigmatism, bilateral: Secondary | ICD-10-CM | POA: Diagnosis not present

## 2021-09-03 NOTE — Progress Notes (Deleted)
OUTPATIENT PHYSICAL THERAPY SHOULDER EVALUATION   Patient Name: [name] [name] MRN: [id number] DOB:08-20-1966, 55 y.o., male Today's Date: 09/03/2021    Past Medical History:  Diagnosis Date   Allergic rhinitis    Allergy    Anemia    Aortic insufficiency    SBE in 2016 >> pt declined AVR // a. Echo 7/16:  Mod LVH, EF 50-55%, mild to mod AI, severe LAE, PASP 57 mmHg (LV ID end diastolic 14.4 mm);  b. Echo 2/17:  Mod LVH, EF 50-55%, mod AI, MAC, mild MR, mod LAE, mild to mod TR, PASP 63 mmHg // Echo 2/21: normal EF, mild MR, mild AI   Atrial fibrillation and flutter (HCC)    chronic anticoag with Warfarin // bradycardia noted during admit 10/21   Bilateral carpal tunnel syndrome    Bradycardia    brady/low BP event during admit in 10/21 >> poor candidate for pacer/EPS due to high risk of infection with ESRD; pacer not recommended by EP   CAD (coronary artery disease)    s/p NSTEMI 10/21: cath>> pLAD 99 >> tx with DES; Lat RI 100 and OM1 100 - med Rx   Cataract    removed both eyes    Cholecystitis    dx during admx 10/21 >> HIDA neg // exam benign // high risk for surgery >> no surgical intervention recommended.    Claustrophobia    COPD (chronic obstructive pulmonary disease) (HCC)    Coronary artery disease    Diabetes mellitus    Type II   Diabetic peripheral neuropathy (HCC)    Diabetic retinopathy ([place])    Discitis of lumbar region    resolved   DVT (deep venous thrombosis) ([place]) 10/07/2015   LLE   Dysrhythmia 07/2020   afib   Endocarditis 11/28/14   ESRD (end stage renal disease) on dialysis [facility])    s/p renal transplant in 2009, failed in 2016   Gait disorder    Gastroparesis    GERD (gastroesophageal reflux disease)    Gout    Hearing difficulty    Hemodialysis patient ([place])    tuesday, thursday, saturday    HFrEF (HF with reduced EF)    Ischemic CM // Echo 10/21: EF 40-45, dist sept and apical AK, mild LVH, normal RVSF, mod MAC   Hyperlipidemia     Hypertension    Incomplete bladder emptying    Leg pain 02/05/11   with walking   Morbid obesity ([place])    Myocardial infarction ([place]) 07/2020   Obstructive sleep apnea    not using CPAP- lost 100lbs   Osteomyelitis (HCC)    Posttraumatic stress disorder    Rotator cuff disorder    Secondary hyperparathyroidism ([place])    Sepsis ([place])    Postive blood cultures -    Sleep apnea    no cpap   Urethral stricture    Vitamin D deficiency    Past Surgical History:  Procedure Laterality Date   ARTERIOVENOUS GRAFT PLACEMENT Bilateral "several"   AV FISTULA PLACEMENT Bilateral 2015   AV FISTULA PLACEMENT Right 06/13/2019   Procedure: INSERTION OF ARTERIOVENOUS (AV) GORE-TEX GRAFT RIGHT  ARM;  Surgeon: Elam Dutch, MD;  Location: [place];  Service: Vascular;  Laterality: Right;   [place] REMOVAL Right 11/08/2020   Procedure: Excision of Foreign Body;  Surgeon: [name], MD;  Location: [place];  Service: Vascular;  Laterality: Right;   [place] Right 81/85/6314   BASCILIC VEIN TRANSPOSITION Right 09/27/2015  Procedure: BASILIC VEIN TRANSPOSITION right;  Surgeon: Mal Misty, MD;  Location: [place];  Service: Vascular;  Laterality: Right;   CARDIAC CATHETERIZATION  07/2020   CATARACT EXTRACTION W/ INTRAOCULAR LENS  IMPLANT, BILATERAL Bilateral    CORONARY STENT INTERVENTION N/A 07/27/2020   Procedure: CORONARY STENT INTERVENTION;  Surgeon: Burnell Blanks, MD;  Location: [place] [facility];  Service: Cardiovascular;  Laterality: N/A;   CORONARY STENT INTERVENTION N/A 02/17/2021   Procedure: CORONARY STENT INTERVENTION;  Surgeon: Burnell Blanks, MD;  Location: [place] [facility];  Service: Cardiovascular;  Laterality: N/A;   CYSTOSCOPY W/ INTERNAL URETHROTOMY  09/2014   EXCHANGE OF A DIALYSIS CATHETER Right 02/14/2021   Procedure: EXCHANGE OF A DIALYSIS CATHETER RIGHT INTERNAL JUGULAR;  Surgeon: Serafina Mitchell, MD;  Location: [place];  Service: Vascular;   Laterality: Right;   EYE SURGERY Bilateral 2006   INSERTION OF DIALYSIS CATHETER N/A 11/08/2020   Procedure: INSERTION OF DIALYSIS CATHETER;  Surgeon: [name], MD;  Location: [place];  Service: Vascular;  Laterality: N/A;   IR THROMBECTOMY AV FISTULA W/THROMBOLYSIS/PTA INC/SHUNT/IMG RIGHT Right 08/01/2019   IR US GUIDE VASC ACCESS RIGHT  08/01/2019   KIDNEY TRANSPLANT  2009   LEFT HEART CATH AND CORONARY ANGIOGRAPHY N/A 07/27/2020   Procedure: LEFT HEART CATH AND CORONARY ANGIOGRAPHY;  Surgeon: Burnell Blanks, MD;  Location: [place] [facility];  Service: Cardiovascular;  Laterality: N/A;   LEFT HEART CATH AND CORONARY ANGIOGRAPHY N/A 02/17/2021   Procedure: LEFT HEART CATH AND CORONARY ANGIOGRAPHY;  Surgeon: Burnell Blanks, MD;  Location: [place] [facility];  Service: Cardiovascular;  Laterality: N/A;   SIGMOIDOSCOPY     UPPER EXTREMITY VENOGRAPHY Bilateral 06/02/2019   Procedure: UPPER EXTREMITY VENOGRAPHY;  Surgeon: Elam Dutch, MD;  Location: [place] [facility];  Service: Cardiovascular;  Laterality: Bilateral;   UPPER GASTROINTESTINAL ENDOSCOPY     VASCULAR ACCESS DEVICE INSERTION Right 11/07/2020   Procedure: RIGHT UPPER EXTREMITY HERO GRAFT;  Surgeon: [name], MD;  Location: [place];  Service: Vascular;  Laterality: Right;   Patient Active Problem List   Diagnosis Date Noted   Cellulitis, unspecified 06/17/2021   Non-ST elevation (NSTEMI) myocardial infarction [facility])    Chest pain 02/14/2021   Shortness of breath 02/11/2021   Cardiac arrest ([place]) 01/14/2021   Disorder of phosphorus metabolism, unspecified 01/06/2021   Unspecified infection due to central venous catheter, initial encounter 01/02/2021   Hemodialysis catheter dysfunction ([place]) 11/08/2020   ESRD (end stage renal disease) ([place]) 11/08/2020   Bradycardia 08/03/2020   Hypotension 08/02/2020   CAD (coronary artery disease) 08/02/2020   Atrial Fibrillation/Flutter 07/27/2020   Band  keratopathy of both eyes 11/10/2019   Pseudophakia of both eyes 11/10/2019   Routine general medical examination at a health care facility 27/03/2375   Complication of vascular dialysis catheter 07/17/2019   Cholecystitis 04/20/2019   Allergy, unspecified, sequela 12/29/2018   Leg lesion 06/16/2018   Protein calorie malnutrition ([place]) 05/09/2018   Hemorrhoid 02/28/2018   Numbness 10/20/2017   Hypocalcemia 02/20/2016   Anterior urethral stricture 12/09/2015   ESRD on dialysis ([place]) 11/15/2015   Coagulation defect, unspecified ([place]) 11/02/2015   HFrEF (heart failure with reduced ejection fraction) ([place]) 10/07/2015   Prolonged Q-T interval on ECG 10/07/2015   Chronic deep vein thrombosis (DVT) of left lower extremity (HCC) 10/07/2015   Hypomagnesemia 06/26/2015   Aortic insufficiency 04/12/2015   Endocarditis 12/04/2014   Lumbar discitis 11/22/2014   Back  pain at L4-L5 level    Anemia due to chronic kidney disease    Cervical radiculopathy    Diabetic peripheral neuropathy ([place]) 10/16/2014   Renal transplant, status post 04/05/2014   OSA (obstructive sleep apnea) 04/05/2014   Severe obesity (BMI >= 40) ([place]) 04/05/2014   Benign fibroma of prostate 02/05/2014   Bladder neck obstruction 02/05/2014   Diabetic macular edema ([place]) 01/15/2012   Diabetes mellitus, type 2 ([place]) 01/15/2012   HYPERPARATHYROIDISM, SECONDARY 01/27/2010   GOUT 01/27/2010   Anxiety 01/27/2010   INSOMNIA 01/27/2010   HLD (hyperlipidemia) 05/25/2008   Essential hypertension 05/25/2008    PCP: Hoyt Koch, MD  REFERRING PROVIDER: Burnell Blanks*  REFERRING DIAG: ***  THERAPY DIAG:  No diagnosis found.   ONSET DATE: ***  SUBJECTIVE:                                                                                                                                                                                      SUBJECTIVE STATEMENT: ***  PERTINENT HISTORY: ***  PAIN:  Are you  having pain? {yes/no:20286} VAS scale: ***/10 Pain location: *** Pain orientation: {Pain Orientation:25161}  PAIN TYPE: {type:313116} Pain description: {PAIN DESCRIPTION:21022940}  Aggravating factors: *** Relieving factors: ***  PRECAUTIONS: {Therapy precautions:24002}  WEIGHT BEARING RESTRICTIONS {Yes ***/No:24003}  FALLS:  Has patient fallen in last 6 months? {yes/no:20286} Number of falls: ***  LIVING ENVIRONMENT: Lives with: {[facility] lives with:25569::"lives with their family"} Lives in: {Lives in:25570} Stairs: {yes/no:20286}; {Stairs:24000} Has following equipment at home: {Assistive devices:23999}  PLOF: {PLOF:24004}  PATIENT GOALS ***  OBJECTIVE:   DIAGNOSTIC FINDINGS:  ***  PATIENT SURVEYS:  {rehab surveys:24030:a}  COGNITION:  Overall cognitive status: {cognition:24006}     SENSATION:  Light touch: {intact/deficits:24005}  Stereognosis: {intact/deficits:24005}  Hot/Cold: {intact/deficits:24005}  Proprioception: {intact/deficits:24005}  PALPATION: ***  POSTURE: ***  UPPER EXTREMITY AROM/PROM:  A/PROM Right 09/03/2021 Left 09/03/2021  Shoulder flexion    Shoulder extension    Shoulder abduction    Shoulder adduction    Shoulder extension    Shoulder internal rotation    Shoulder external rotation    Elbow flexion    Elbow extension    Wrist flexion    Wrist extension    Wrist ulnar deviation    Wrist radial deviation    Wrist pronation    Wrist supination    (Blank rows = not tested)  UPPER EXTREMITY MMT:  MMT Right 09/03/2021 Left 09/03/2021  Shoulder flexion    Shoulder extension    Shoulder abduction    Shoulder adduction    Shoulder extension    Middle trapezius    Lower trapezius  Elbow flexion    Elbow extension    Wrist flexion    Wrist extension    Wrist ulnar deviation    Wrist radial deviation    Wrist pronation    Wrist supination    Grip strength    (Blank rows = not tested)  SHOULDER SPECIAL  TESTS:  Impingement tests: {shoulder impingement test:25231:a}  SLAP lesions: {SLAP lesions:25232}  Instability tests: {shoulder instability test:25233}  Rotator cuff assessment: {rotator cuff assessment:25234}  Biceps assessment: {biceps assessment:25235}  JOINT MOBILITY TESTING:  ***  PALPATION:  ***  POSTURE:  ***   TODAY'S TREATMENT:  ***   PATIENT EDUCATION: Education details: *** Person educated: {Person educated:25204} Education method: {Education Method:25205} Education comprehension: {Education Comprehension:25206}   HOME EXERCISE PROGRAM: ***  ASSESSMENT:  CLINICAL IMPRESSION: Patient is a *** y.o. *** who was seen today for physical therapy evaluation and treatment for ***. Objective impairments include {opptimpairments:25111}. These impairments are limiting patient from {activity limitations:25113}. Personal factors including {Personal factors:25162} are also affecting patient's functional outcome. Patient will benefit from skilled PT to address above impairments and improve overall function.  REHAB POTENTIAL: {rehabpotential:25112}  CLINICAL DECISION MAKING: {clinical decision making:25114}  EVALUATION COMPLEXITY: {Evaluation complexity:25115}   GOALS: Goals reviewed with patient? {yes/no:20286}  SHORT TERM GOALS:  STG Name Target Date Goal status  1 *** Baseline:  {follow up:25551} {GOALSTATUS:25110}  2 *** Baseline:  {follow up:25551} {GOALSTATUS:25110}  3 *** Baseline: {follow up:25551} {GOALSTATUS:25110}  4 *** Baseline: {follow up:25551} {GOALSTATUS:25110}   LONG TERM GOALS:   LTG Name Target Date Goal status  1 *** Baseline: {follow up:25551} {GOALSTATUS:25110}  2 *** Baseline: {follow up:25551} {GOALSTATUS:25110}  3 *** Baseline: {follow up:25551} {GOALSTATUS:25110}  4 *** Baseline: {follow up:25551} {GOALSTATUS:25110}   PLAN: PT FREQUENCY: {rehab frequency:25116}  PT DURATION: {rehab duration:25117}  PLANNED INTERVENTIONS:  {rehab planned interventions:25118::"Therapeutic exercises","Therapeutic activity","Neuro Muscular re-education","Balance training","Gait training","Patient/Family education","Joint mobilization"}  PLAN FOR NEXT SESSION: [place] 09/03/2021, 7:04 PM

## 2021-09-05 DIAGNOSIS — N2581 Secondary hyperparathyroidism of renal origin: Secondary | ICD-10-CM | POA: Diagnosis not present

## 2021-09-05 DIAGNOSIS — N186 End stage renal disease: Secondary | ICD-10-CM | POA: Diagnosis not present

## 2021-09-05 DIAGNOSIS — Z992 Dependence on renal dialysis: Secondary | ICD-10-CM | POA: Diagnosis not present

## 2021-09-07 DIAGNOSIS — Z992 Dependence on renal dialysis: Secondary | ICD-10-CM | POA: Diagnosis not present

## 2021-09-07 DIAGNOSIS — N2581 Secondary hyperparathyroidism of renal origin: Secondary | ICD-10-CM | POA: Diagnosis not present

## 2021-09-07 DIAGNOSIS — N186 End stage renal disease: Secondary | ICD-10-CM | POA: Diagnosis not present

## 2021-09-08 ENCOUNTER — Other Ambulatory Visit: Payer: Self-pay

## 2021-09-08 ENCOUNTER — Ambulatory Visit: Payer: Medicare HMO | Attending: Cardiovascular Disease | Admitting: Physical Therapy

## 2021-09-08 ENCOUNTER — Encounter: Payer: Self-pay | Admitting: Physical Therapy

## 2021-09-08 VITALS — BP 108/65 | HR 92

## 2021-09-08 DIAGNOSIS — R29898 Other symptoms and signs involving the musculoskeletal system: Secondary | ICD-10-CM | POA: Insufficient documentation

## 2021-09-08 DIAGNOSIS — I2510 Atherosclerotic heart disease of native coronary artery without angina pectoris: Secondary | ICD-10-CM | POA: Insufficient documentation

## 2021-09-08 DIAGNOSIS — R6889 Other general symptoms and signs: Secondary | ICD-10-CM | POA: Insufficient documentation

## 2021-09-08 DIAGNOSIS — I214 Non-ST elevation (NSTEMI) myocardial infarction: Secondary | ICD-10-CM | POA: Diagnosis not present

## 2021-09-08 DIAGNOSIS — R2681 Unsteadiness on feet: Secondary | ICD-10-CM | POA: Diagnosis not present

## 2021-09-08 DIAGNOSIS — M6281 Muscle weakness (generalized): Secondary | ICD-10-CM | POA: Insufficient documentation

## 2021-09-08 NOTE — Therapy (Addendum)
[place], [place], [address] Phone: [phone]   Fax:  [phone]  Physical Therapy Evaluation / Discharge  Patient Details  Name: [name] [name] MRN: [id number] Date of Birth: April 06, 1966 Referring Provider (PT): Burnell Blanks, MD   Encounter Date: 09/08/2021   PT End of Session - 09/08/21 1022     Visit Number 1    Number of Visits 17    Date for PT Re-Evaluation 11/03/21    PT Start Time 0174    PT Stop Time 1102    PT Time Calculation (min) 47 min    Activity Tolerance Patient tolerated treatment well    Behavior During Therapy [facility] for tasks assessed/performed             Past Medical History:  Diagnosis Date   Allergic rhinitis    Allergy    Anemia    Aortic insufficiency    SBE in 2016 >> pt declined AVR // a. Echo 7/16:  Mod LVH, EF 50-55%, mild to mod AI, severe LAE, PASP 57 mmHg (LV ID end diastolic 94.4 mm);  b. Echo 2/17:  Mod LVH, EF 50-55%, mod AI, MAC, mild MR, mod LAE, mild to mod TR, PASP 63 mmHg // Echo 2/21: normal EF, mild MR, mild AI   Atrial fibrillation and flutter (HCC)    chronic anticoag with Warfarin // bradycardia noted during admit 10/21   Bilateral carpal tunnel syndrome    Bradycardia    brady/low BP event during admit in 10/21 >> poor candidate for pacer/EPS due to high risk of infection with ESRD; pacer not recommended by EP   CAD (coronary artery disease)    s/p NSTEMI 10/21: cath>> pLAD 99 >> tx with DES; Lat RI 100 and OM1 100 - med Rx   Cataract    removed both eyes    Cholecystitis    dx during admx 10/21 >> HIDA neg // exam benign // high risk for surgery >> no surgical intervention recommended.    Claustrophobia    COPD (chronic obstructive pulmonary disease) (HCC)    Coronary artery disease    Diabetes mellitus    Type II   Diabetic peripheral neuropathy (HCC)    Diabetic retinopathy ([place])    Discitis of lumbar region    resolved    DVT (deep venous thrombosis) ([place]) 10/07/2015   LLE   Dysrhythmia 07/2020   afib   Endocarditis 11/28/14   ESRD (end stage renal disease) on dialysis [facility])    s/p renal transplant in 2009, failed in 2016   Gait disorder    Gastroparesis    GERD (gastroesophageal reflux disease)    Gout    Hearing difficulty    Hemodialysis patient ([place])    tuesday, thursday, saturday    HFrEF (HF with reduced EF)    Ischemic CM // Echo 10/21: EF 40-45, dist sept and apical AK, mild LVH, normal RVSF, mod MAC   Hyperlipidemia    Hypertension    Incomplete bladder emptying    Leg pain 02/05/11   with walking   Morbid obesity ([place])    Myocardial infarction ([place]) 07/2020   Obstructive sleep apnea    not using CPAP- lost 100lbs   Osteomyelitis (HCC)    Posttraumatic stress disorder    Rotator cuff disorder    Secondary hyperparathyroidism ([place])    Sepsis ([place])    Postive blood cultures -    Sleep apnea    no cpap  Urethral stricture    Vitamin D deficiency     Past Surgical History:  Procedure Laterality Date   ARTERIOVENOUS GRAFT PLACEMENT Bilateral "several"   AV FISTULA PLACEMENT Bilateral 2015   AV FISTULA PLACEMENT Right 06/13/2019   Procedure: INSERTION OF ARTERIOVENOUS (AV) GORE-TEX GRAFT RIGHT  ARM;  Surgeon: Elam Dutch, MD;  Location: [place];  Service: Vascular;  Laterality: Right;   [place] REMOVAL Right 11/08/2020   Procedure: Excision of Foreign Body;  Surgeon: [name], MD;  Location: [place];  Service: Vascular;  Laterality: Right;   [place] Right 22/63/3354   BASCILIC VEIN TRANSPOSITION Right 09/27/2015   Procedure: BASILIC VEIN TRANSPOSITION right;  Surgeon: Mal Misty, MD;  Location: [place];  Service: Vascular;  Laterality: Right;   CARDIAC CATHETERIZATION  07/2020   CATARACT EXTRACTION W/ INTRAOCULAR LENS  IMPLANT, BILATERAL Bilateral    CORONARY STENT INTERVENTION N/A 07/27/2020   Procedure: CORONARY STENT INTERVENTION;  Surgeon: Burnell Blanks, MD;  Location: [place] [facility];  Service: Cardiovascular;  Laterality: N/A;   CORONARY STENT INTERVENTION N/A 02/17/2021   Procedure: CORONARY STENT INTERVENTION;  Surgeon: Burnell Blanks, MD;  Location: [place] [facility];  Service: Cardiovascular;  Laterality: N/A;   CYSTOSCOPY W/ INTERNAL URETHROTOMY  09/2014   EXCHANGE OF A DIALYSIS CATHETER Right 02/14/2021   Procedure: EXCHANGE OF A DIALYSIS CATHETER RIGHT INTERNAL JUGULAR;  Surgeon: Serafina Mitchell, MD;  Location: [place];  Service: Vascular;  Laterality: Right;   EYE SURGERY Bilateral 2006   INSERTION OF DIALYSIS CATHETER N/A 11/08/2020   Procedure: INSERTION OF DIALYSIS CATHETER;  Surgeon: [name], MD;  Location: [place];  Service: Vascular;  Laterality: N/A;   IR THROMBECTOMY AV FISTULA W/THROMBOLYSIS/PTA INC/SHUNT/IMG RIGHT Right 08/01/2019   IR US GUIDE VASC ACCESS RIGHT  08/01/2019   KIDNEY TRANSPLANT  2009   LEFT HEART CATH AND CORONARY ANGIOGRAPHY N/A 07/27/2020   Procedure: LEFT HEART CATH AND CORONARY ANGIOGRAPHY;  Surgeon: Burnell Blanks, MD;  Location: [place] [facility];  Service: Cardiovascular;  Laterality: N/A;   LEFT HEART CATH AND CORONARY ANGIOGRAPHY N/A 02/17/2021   Procedure: LEFT HEART CATH AND CORONARY ANGIOGRAPHY;  Surgeon: Burnell Blanks, MD;  Location: [place] [facility];  Service: Cardiovascular;  Laterality: N/A;   SIGMOIDOSCOPY     UPPER EXTREMITY VENOGRAPHY Bilateral 06/02/2019   Procedure: UPPER EXTREMITY VENOGRAPHY;  Surgeon: Elam Dutch, MD;  Location: [place] [facility];  Service: Cardiovascular;  Laterality: Bilateral;   UPPER GASTROINTESTINAL ENDOSCOPY     VASCULAR ACCESS DEVICE INSERTION Right 11/07/2020   Procedure: RIGHT UPPER EXTREMITY HERO GRAFT;  Surgeon: [name], MD;  Location: [facility];  Service: Vascular;  Laterality: Right;    Vitals:   09/08/21 1023  BP: 108/65  Pulse: 92  SpO2: 96%      Subjective Assessment - 09/08/21 1023      Subjective pt was hospitalized on 07/27/2021 in [place] for 10 day s due to aNon-ST elevation (NSTEMI) myocardial infarction. He reports having pain occasionally in the shoulders due to having a graft due to history of clots. He currently utilizes a motorized W/C and had it since 2017.    How long can you sit comfortably? unlimited    How long can you stand comfortably? 5 min    How long can you walk comfortably? 30 sec to a min    Patient Stated Goals to get arms and legs stronger  Currently in Pain? Yes    Pain Score 2    intermittnely   Pain Location Shoulder    Pain Orientation Right;Left    Pain Type Chronic pain    Pain Frequency Intermittent    Aggravating Factors  prolonged use    Pain Relieving Factors resting    Effect of Pain on Daily Activities limited positional tolerance.                [facility] PT Assessment - 09/08/21 0001       Assessment   Medical Diagnosis Coronary artery disease involving native coronary artery of native heart without angina pectoris (I25.10), Non-ST elevation (NSTEMI) myocardial infarction ([place]) (I21.4), Decreased functional activity tolerance (Q42.10)    Referring Provider (PT) Burnell Blanks, MD    Onset Date/Surgical Date 07/27/21    Hand Dominance Right      Precautions   Precautions Fall      Balance Screen   Has the patient fallen in the past 6 months Yes    How many times? 1    Has the patient had a decrease in activity level because of a fear of falling?  Yes    Is the patient reluctant to leave their home because of a fear of falling?  Yes      [place] residence    Living Arrangements Alone    Type of [place] to enter    Entrance Stairs-Number of Steps 13    Entrance Stairs-Rails Right   and the wall   Home Layout One level    Home Equipment Wheelchair - power;Shower seat      Prior Function   Vocation On disability      Cognition    Overall Cognitive Status Within Functional Limits for tasks assessed      Observation/Other Assessments   Focus on Therapeutic Outcomes (FOTO)  20%   31% predicted     ROM / Strength   AROM / PROM / Strength AROM;Strength;PROM      AROM   AROM Assessment Site Shoulder;Hip;Knee    Right/Left Shoulder Right;Left    Right/Left Hip Right;Left    Right/Left Knee Right;Left      Strength   Strength Assessment Site Shoulder;Hip;Knee    Right/Left Shoulder Right;Left    Right Shoulder Flexion 4+/5    Right Shoulder Extension 4+/5    Right Shoulder ABduction 4+/5    Right Shoulder Internal Rotation 4+/5    Left Shoulder Extension 4+/5    Left Shoulder ABduction 4+/5    Left Shoulder Internal Rotation 4+/5    Left Shoulder External Rotation 4+/5    Right/Left Hip Right;Left    Right Hip Flexion 3+/5    Right Hip Extension 3/5    Right Hip ABduction 4-/5    Left Hip Flexion 3+/5    Left Hip Extension 3/5    Left Hip ABduction 4-/5    Right/Left Knee Right;Left    Right Knee Flexion 4/5    Right Knee Extension 4/5    Left Knee Flexion 4/5    Left Knee Extension 4/5      Transfers   Transfers Stand to Sit;Sit to Stand    Sit to Stand 4: Min assist    Five time sit to stand comments  unable to perform    Stand to Sit 4: Min assist    Comments 2 attempts to stand up with use of  RW in front      Standardized Balance Assessment   Standardized Balance Assessment Five Times Sit to Stand    Five times sit to stand comments  unable to perform test                        Objective measurements completed on examination: See above findings.                PT Education - 09/08/21 1031     Education Details evaluation findings, POC, goals, HEP with proper form, reviewed FOTO assessment.    Person(s) Educated Patient    Methods Explanation;Verbal cues;Handout    Comprehension Verbalized understanding;Verbal cues required              PT Short Term  Goals - 09/08/21 1038       PT SHORT TERM GOAL #1   Title independent with initial HEP    Time 4    Period Weeks    Status New    Target Date 10/06/21      PT SHORT TERM GOAL #2   Title --    Time 4    Period Weeks    Status New    Target Date 10/06/21      PT SHORT TERM GOAL #3   Title pt to be able to stand with RW and demonstrate MODI transfering from W/C to table with min assist for safety    Time 4    Period Weeks    Status New    Target Date 10/06/21               PT Long Term Goals - 09/08/21 1124       PT LONG TERM GOAL #1   Title increase bil LE gross strength to >/= 4/5 to promote safety with standing and transfers    Time 8    Period Weeks    Status New    Target Date 11/03/21      PT LONG TERM GOAL #2   Title pt to be able to stand with LRAD and demonstrate MODI transfering from W/C to table with </= supervision for safety    Baseline required min to mod assist +1 for safety with RW    Time 8    Period Weeks    Status New    Target Date 11/03/21      PT LONG TERM GOAL #3   Title pt to able to perform TUG testing within functional margins for in home ambulation </= 15 sec (which is set at threshold classified for falls.)    Baseline unable to perform at eval    Time 8    Period Weeks    Status New    Target Date 11/03/21      PT LONG TERM GOAL #4   Title pt to walk >/= 300 ft and standing for >/= 1 min for in home mobility    Time 8    Period Weeks    Status New    Target Date 11/03/21      PT LONG TERM GOAL #5   Title pt to be IND with all HEP to be able to progress to maintain current LOF and progress to cardiac rehab    Time 8    Period Weeks    Status New    Target Date 11/03/21  Plan - 09/08/21 1033     Clinical Impression Statement pt is a 55 yo m preseting to OPPT with CC of general weakness/ fatigue following a non-Stemi that occurred during a catherization procedure. He currently demonstrates  functionl UE/ LE ROM. MMT revealed gross weakness most notably in bil LE. He currently utilizes a power W/C for both in/ out of home mobility but was able to stand in session Min assist with use of RW but did fatigue quickly requiring need to sit in 30 seconds. Vitals were assessed with BP being 108/65, HR was elevated but WNL at 92 and O2 at 96%. He would benefit from physical therapy to improve gross LE strength, promote safety with transfers, proper use of DME, maximize his function by addressing the deficits as listed.    Personal Factors and Comorbidities Comorbidity 3+;Past/Current Experience    Comorbidities extensive PMhx, including  MI, DM, bradicardia, dysrrythmia    Examination-Activity Limitations Squat;Stairs;Lift;Locomotion Level    Examination-Participation Restrictions Laundry    Stability/Clinical Decision Making Unstable/Unpredictable    Rehab Potential Good    PT Frequency 2x / week    PT Duration 8 weeks    PT Treatment/Interventions ADLs/Self Care Home Management;Cryotherapy;Functional mobility training;Gait training;Stair training;Therapeutic activities;Therapeutic exercise;Balance training;Neuromuscular re-education;Patient/family education;Manual techniques;Passive range of motion;Aquatic Therapy    PT Next Visit Plan review/ update HEP PRN, assess Vitals, gross LE strengthening and core strengthening. , Min-mod assist with Sit to stand with RW. If able perform Tug test, if able assess walking distance    PT Home Exercise Plan [email or web address]- bridges, supine clamshell, seated marching, LAQ    Consulted and Agree with Plan of Care Patient             Patient will benefit from skilled therapeutic intervention in order to improve the following deficits and impairments:  Improper body mechanics, Increased muscle spasms, Decreased strength, Postural dysfunction, Pain, Decreased activity tolerance  Visit Diagnosis: Muscle weakness (generalized)  Unsteadiness on feet  Other  symptoms and signs involving the musculoskeletal system     Problem List Patient Active Problem List   Diagnosis Date Noted   Cellulitis, unspecified 06/17/2021   Non-ST elevation (NSTEMI) myocardial infarction [facility])    Chest pain 02/14/2021   Shortness of breath 02/11/2021   Cardiac arrest ([place]) 01/14/2021   Disorder of phosphorus metabolism, unspecified 01/06/2021   Unspecified infection due to central venous catheter, initial encounter 01/02/2021   Hemodialysis catheter dysfunction ([place]) 11/08/2020   ESRD (end stage renal disease) ([place]) 11/08/2020   Bradycardia 08/03/2020   Hypotension 08/02/2020   CAD (coronary artery disease) 08/02/2020   Atrial Fibrillation/Flutter 07/27/2020   Band keratopathy of both eyes 11/10/2019   Pseudophakia of both eyes 11/10/2019   Routine general medical examination at a health care facility 26/37/8588   Complication of vascular dialysis catheter 07/17/2019   Cholecystitis 04/20/2019   Allergy, unspecified, sequela 12/29/2018   Leg lesion 06/16/2018   Protein calorie malnutrition ([place]) 05/09/2018   Hemorrhoid 02/28/2018   Numbness 10/20/2017   Hypocalcemia 02/20/2016   Anterior urethral stricture 12/09/2015   ESRD on dialysis ([place]) 11/15/2015   Coagulation defect, unspecified ([place]) 11/02/2015   HFrEF (heart failure with reduced ejection fraction) ([place]) 10/07/2015   Prolonged Q-T interval on ECG 10/07/2015   Chronic deep vein thrombosis (DVT) of left lower extremity ([place]) 10/07/2015   Hypomagnesemia 06/26/2015   Aortic insufficiency 04/12/2015   Endocarditis 12/04/2014   Lumbar discitis 11/22/2014   Back pain at L4-L5 level    Anemia due  to chronic kidney disease    Cervical radiculopathy    Diabetic peripheral neuropathy ([place]) 10/16/2014   Renal transplant, status post 04/05/2014   OSA (obstructive sleep apnea) 04/05/2014   Severe obesity (BMI >= 40) ([place]) 04/05/2014   Benign fibroma of prostate 02/05/2014   Bladder neck obstruction  02/05/2014   Diabetic macular edema ([place]) 01/15/2012   Diabetes mellitus, type 2 ([place]) 01/15/2012   HYPERPARATHYROIDISM, SECONDARY 01/27/2010   GOUT 01/27/2010   Anxiety 01/27/2010   INSOMNIA 01/27/2010   HLD (hyperlipidemia) 05/25/2008   Essential hypertension 05/25/2008   Starr Lake PT, DPT, LAT, ATC  09/08/21  11:45 AM     [facility] [address] [place], [place], [address] Phone: [phone]   Fax:  [phone]  Name: [name] [name] MRN: [id number] Date of Birth: June 08, 1966    Referring diagnosis? Decreased functional activity tolerance [R68.89] Treatment diagnosis? (if different than referring diagnosis) m62.81 What was this (referring dx) caused by? MI _0  Surgery _1  Fall _2  Ongoing issue _3  Arthritis _4  Other: ____________  Laterality: _5  Rt _6  Lt _7  Both  Check all possible CPT codes:      _8  97110 (Therapeutic Exercise)  _9  92507 (SLP Treatment)  _10  61683 (Neuro Re-ed)   _11  72902 (Swallowing Treatment)   _12  11155 (Gait Training)   _13  20802 (Cognitive Training, 1st 15 minutes) _14  97140 (Manual Therapy)   _15  97130 (Cognitive Training, each add'l 15 minutes)  _16  97530 (Therapeutic Activities)  _17  Other, List CPT Code ____________    _18  23361 (Self Care)       _19  All codes above (97110 - 97535)  _20  97012 (Mechanical Traction)  _21  97014 (E-stim Unattended)  _22  97032 (E-stim manual)  _23  97033 (Ionto)  _24  97035 (Ultrasound)  _25  97760 (Orthotic Fit) _26  97750 (Physical Performance Training) _27  [id number] (Aquatic Therapy) _28  22449 (Contrast Bath) _29  75300 (Paraffin) _30  97597 (Wound Care 1st 20 sq cm) _31  97598 (Wound Care each add'l 20 sq cm) _32  97016 (Vasopneumatic Device) _33  51102 (Orthotic Training) _34  [id number] (Prosthetic Training)       PHYSICAL THERAPY DISCHARGE SUMMARY  Visits from Start of Care: 1  Current functional level related to goals / functional outcomes: See goals    Remaining deficits: Current status unknown   Education / Equipment: HEP   Patient agrees to discharge. Patient goals were not met. Patient is being discharged due to not returning since the last visit.  Tayvian Holycross PT, DPT, LAT, ATC  10/18/21  1:04 PM

## 2021-09-08 NOTE — Patient Instructions (Signed)
Access Code: [email or web address] URL: [email or web address] Date: 09/08/2021 Prepared by: Starr Lake  Exercises Hooklying Clamshell with Resistance - 1 x daily - 7 x weekly - 2 sets - 10 reps Seated March - 1 x daily - 7 x weekly - 2 sets - 10 reps Supine Bridge - 1 x daily - 7 x weekly - 2 sets - 10 reps Seated Long Arc Quad - 1 x daily - 7 x weekly - 2 sets - 10 reps Seated Transversus Abdominis Bracing - 1 x daily - 7 x weekly - 2 sets - 10 reps

## 2021-09-09 DIAGNOSIS — N2581 Secondary hyperparathyroidism of renal origin: Secondary | ICD-10-CM | POA: Diagnosis not present

## 2021-09-09 DIAGNOSIS — Z992 Dependence on renal dialysis: Secondary | ICD-10-CM | POA: Diagnosis not present

## 2021-09-09 DIAGNOSIS — N186 End stage renal disease: Secondary | ICD-10-CM | POA: Diagnosis not present

## 2021-09-10 ENCOUNTER — Other Ambulatory Visit: Payer: Self-pay | Admitting: Internal Medicine

## 2021-09-10 ENCOUNTER — Encounter: Payer: Self-pay | Admitting: Internal Medicine

## 2021-09-10 ENCOUNTER — Other Ambulatory Visit: Payer: Self-pay

## 2021-09-10 ENCOUNTER — Ambulatory Visit (INDEPENDENT_AMBULATORY_CARE_PROVIDER_SITE_OTHER): Payer: Medicare HMO | Admitting: Internal Medicine

## 2021-09-10 VITALS — BP 126/70 | HR 104 | Resp 18 | Ht 70.0 in

## 2021-09-10 DIAGNOSIS — G2581 Restless legs syndrome: Secondary | ICD-10-CM

## 2021-09-10 DIAGNOSIS — N186 End stage renal disease: Secondary | ICD-10-CM

## 2021-09-10 DIAGNOSIS — Z992 Dependence on renal dialysis: Secondary | ICD-10-CM

## 2021-09-10 DIAGNOSIS — E1122 Type 2 diabetes mellitus with diabetic chronic kidney disease: Secondary | ICD-10-CM | POA: Diagnosis not present

## 2021-09-10 DIAGNOSIS — E861 Hypovolemia: Secondary | ICD-10-CM

## 2021-09-10 DIAGNOSIS — I9589 Other hypotension: Secondary | ICD-10-CM

## 2021-09-10 DIAGNOSIS — I5020 Unspecified systolic (congestive) heart failure: Secondary | ICD-10-CM

## 2021-09-10 MED ORDER — ROPINIROLE HCL 0.25 MG PO TABS
0.2500 mg | ORAL_TABLET | Freq: Every day | ORAL | 1 refills | Status: AC
Start: 2021-09-10 — End: ?

## 2021-09-10 NOTE — Progress Notes (Signed)
   Subjective:   Patient ID: [name] [name], male    DOB: [birth year], 55 y.o.   MRN: [id number]  HPI The patient is a 55 YO man with complicated medical history. Coming in for problems with limbs feeling restless and needing to move around during PT and at other times. He is calling this anxiety at first but no emotional anxiety symptoms that I can elicit.   Review of Systems  Constitutional:  Positive for activity change and fatigue.  HENT: Negative.    Eyes: Negative.   Respiratory:  Negative for cough, chest tightness and shortness of breath.   Cardiovascular:  Negative for chest pain, palpitations and leg swelling.  Gastrointestinal:  Positive for rectal pain. Negative for abdominal distention, abdominal pain, constipation, diarrhea, nausea and vomiting.  Musculoskeletal: Negative.   Skin: Negative.   Neurological: Negative.        Limb restlessness  Psychiatric/Behavioral: Negative.     Objective:  Physical Exam Constitutional:      Appearance: He is well-developed. He is obese.  HENT:     Head: Normocephalic and atraumatic.  Cardiovascular:     Rate and Rhythm: Normal rate and regular rhythm.  Pulmonary:     Effort: Pulmonary effort is normal. No respiratory distress.     Breath sounds: Normal breath sounds. No wheezing or rales.  Abdominal:     General: Bowel sounds are normal. There is no distension.     Palpations: Abdomen is soft.     Tenderness: There is no abdominal tenderness. There is no rebound.  Musculoskeletal:     Cervical back: Normal range of motion.  Skin:    General: Skin is warm and dry.  Neurological:     Mental Status: He is alert and oriented to person, place, and time.     Coordination: Coordination abnormal.     Comments: Electric scooter    Vitals:   09/10/21 1020  BP: 126/70  Pulse: (!) 104  Resp: 18  SpO2: 97%  Height: 5\' 10"  (1.778 m)    This visit occurred during the SARS-CoV-2 public health emergency.  Safety protocols were in  place, including screening questions prior to the visit, additional usage of staff PPE, and extensive cleaning of exam room while observing appropriate contact time as indicated for disinfecting solutions.   Assessment & Plan:

## 2021-09-10 NOTE — Patient Instructions (Addendum)
We have sent in requip (ropinorole) to take 1 pill at night time to help with the legs and arms feeling like they are needing to move.

## 2021-09-11 ENCOUNTER — Encounter: Payer: Self-pay | Admitting: Internal Medicine

## 2021-09-11 DIAGNOSIS — E1122 Type 2 diabetes mellitus with diabetic chronic kidney disease: Secondary | ICD-10-CM | POA: Diagnosis not present

## 2021-09-11 DIAGNOSIS — N186 End stage renal disease: Secondary | ICD-10-CM | POA: Diagnosis not present

## 2021-09-11 DIAGNOSIS — N2581 Secondary hyperparathyroidism of renal origin: Secondary | ICD-10-CM | POA: Diagnosis not present

## 2021-09-11 DIAGNOSIS — Z992 Dependence on renal dialysis: Secondary | ICD-10-CM | POA: Diagnosis not present

## 2021-09-11 DIAGNOSIS — G2581 Restless legs syndrome: Secondary | ICD-10-CM | POA: Insufficient documentation

## 2021-09-11 LAB — POCT INR: INR: 1.2 — AB (ref 2.0–3.0)

## 2021-09-11 NOTE — Assessment & Plan Note (Signed)
He is having more fatigue after dialysis and reports sleeping almost a whole day recently after this. He states that it never used to be like this after dialysis. He has had recent cardiac arrest and NSTEMI so he may be progressing in some of his disease states.

## 2021-09-11 NOTE — Assessment & Plan Note (Signed)
Still taking midodrine and it is unclear if he new fatigue after dialysis results from low BP or some other problem. He has checked sugar and denies that this is low. He does feel better after he eats some.

## 2021-09-11 NOTE — Assessment & Plan Note (Signed)
Ordered rollator per patient report of PT request. I did explain to patient that since he has electric scooter/wheelchair which his insurance paid for that they may not pay for a rollator since we have to attest that a walker would not help his mobility to get this. He understands that he may have to buy this out of pocket. He is going through cardiac rehab to help improve his mobility.

## 2021-09-11 NOTE — Assessment & Plan Note (Signed)
I suspect that he has chronic anemia due to dialysis and other health conditions which may be related to this sensation he is describing as anxiety which I clarified to limbs wanting to move and him needing to move around. We will try requip 0.25 mg daily (ESRD dosing) to see if this helps. I do not feel increasing anxiety medication will help his reported symptoms as they do not appear to be true anxiety.

## 2021-09-12 ENCOUNTER — Emergency Department (HOSPITAL_COMMUNITY): Payer: Medicare HMO

## 2021-09-12 ENCOUNTER — Ambulatory Visit (INDEPENDENT_AMBULATORY_CARE_PROVIDER_SITE_OTHER): Payer: Medicare HMO

## 2021-09-12 ENCOUNTER — Encounter (HOSPITAL_COMMUNITY): Payer: Self-pay

## 2021-09-12 ENCOUNTER — Other Ambulatory Visit: Payer: Self-pay | Admitting: Internal Medicine

## 2021-09-12 ENCOUNTER — Inpatient Hospital Stay (HOSPITAL_COMMUNITY): Payer: Medicare HMO

## 2021-09-12 ENCOUNTER — Inpatient Hospital Stay (HOSPITAL_COMMUNITY)
Admission: EM | Admit: 2021-09-12 | Discharge: 2021-10-12 | DRG: 260 | Disposition: E | Payer: Medicare HMO | Attending: Pulmonary Disease | Admitting: Pulmonary Disease

## 2021-09-12 ENCOUNTER — Other Ambulatory Visit: Payer: Self-pay

## 2021-09-12 ENCOUNTER — Encounter (HOSPITAL_COMMUNITY): Admission: EM | Disposition: E | Payer: Self-pay | Source: Home / Self Care | Attending: Critical Care Medicine

## 2021-09-12 DIAGNOSIS — Z4659 Encounter for fitting and adjustment of other gastrointestinal appliance and device: Secondary | ICD-10-CM

## 2021-09-12 DIAGNOSIS — J189 Pneumonia, unspecified organism: Secondary | ICD-10-CM

## 2021-09-12 DIAGNOSIS — R55 Syncope and collapse: Principal | ICD-10-CM

## 2021-09-12 DIAGNOSIS — I442 Atrioventricular block, complete: Secondary | ICD-10-CM

## 2021-09-12 DIAGNOSIS — R570 Cardiogenic shock: Secondary | ICD-10-CM

## 2021-09-12 DIAGNOSIS — Z09 Encounter for follow-up examination after completed treatment for conditions other than malignant neoplasm: Secondary | ICD-10-CM

## 2021-09-12 DIAGNOSIS — T829XXA Unspecified complication of cardiac and vascular prosthetic device, implant and graft, initial encounter: Secondary | ICD-10-CM

## 2021-09-12 DIAGNOSIS — R001 Bradycardia, unspecified: Secondary | ICD-10-CM | POA: Diagnosis not present

## 2021-09-12 DIAGNOSIS — Z452 Encounter for adjustment and management of vascular access device: Secondary | ICD-10-CM

## 2021-09-12 DIAGNOSIS — N186 End stage renal disease: Secondary | ICD-10-CM

## 2021-09-12 DIAGNOSIS — Z515 Encounter for palliative care: Secondary | ICD-10-CM | POA: Diagnosis not present

## 2021-09-12 DIAGNOSIS — I2729 Other secondary pulmonary hypertension: Secondary | ICD-10-CM | POA: Diagnosis present

## 2021-09-12 DIAGNOSIS — I5043 Acute on chronic combined systolic (congestive) and diastolic (congestive) heart failure: Secondary | ICD-10-CM | POA: Diagnosis not present

## 2021-09-12 DIAGNOSIS — A419 Sepsis, unspecified organism: Secondary | ICD-10-CM | POA: Diagnosis not present

## 2021-09-12 DIAGNOSIS — I5082 Biventricular heart failure: Secondary | ICD-10-CM | POA: Diagnosis present

## 2021-09-12 DIAGNOSIS — I472 Ventricular tachycardia, unspecified: Secondary | ICD-10-CM | POA: Diagnosis not present

## 2021-09-12 DIAGNOSIS — R000 Tachycardia, unspecified: Secondary | ICD-10-CM | POA: Diagnosis not present

## 2021-09-12 DIAGNOSIS — Z7189 Other specified counseling: Secondary | ICD-10-CM | POA: Diagnosis not present

## 2021-09-12 DIAGNOSIS — I132 Hypertensive heart and chronic kidney disease with heart failure and with stage 5 chronic kidney disease, or end stage renal disease: Secondary | ICD-10-CM | POA: Diagnosis not present

## 2021-09-12 DIAGNOSIS — I21A1 Myocardial infarction type 2: Secondary | ICD-10-CM | POA: Diagnosis present

## 2021-09-12 DIAGNOSIS — J9621 Acute and chronic respiratory failure with hypoxia: Secondary | ICD-10-CM | POA: Diagnosis present

## 2021-09-12 DIAGNOSIS — I82502 Chronic embolism and thrombosis of unspecified deep veins of left lower extremity: Secondary | ICD-10-CM

## 2021-09-12 DIAGNOSIS — Z638 Other specified problems related to primary support group: Secondary | ICD-10-CM

## 2021-09-12 DIAGNOSIS — E1165 Type 2 diabetes mellitus with hyperglycemia: Secondary | ICD-10-CM | POA: Diagnosis present

## 2021-09-12 DIAGNOSIS — R2681 Unsteadiness on feet: Secondary | ICD-10-CM | POA: Diagnosis not present

## 2021-09-12 DIAGNOSIS — I38 Endocarditis, valve unspecified: Secondary | ICD-10-CM | POA: Diagnosis not present

## 2021-09-12 DIAGNOSIS — J9602 Acute respiratory failure with hypercapnia: Secondary | ICD-10-CM | POA: Diagnosis not present

## 2021-09-12 DIAGNOSIS — E1122 Type 2 diabetes mellitus with diabetic chronic kidney disease: Secondary | ICD-10-CM | POA: Diagnosis present

## 2021-09-12 DIAGNOSIS — I4892 Unspecified atrial flutter: Secondary | ICD-10-CM | POA: Diagnosis present

## 2021-09-12 DIAGNOSIS — I509 Heart failure, unspecified: Secondary | ICD-10-CM | POA: Diagnosis not present

## 2021-09-12 DIAGNOSIS — K567 Ileus, unspecified: Secondary | ICD-10-CM | POA: Diagnosis not present

## 2021-09-12 DIAGNOSIS — Z794 Long term (current) use of insulin: Secondary | ICD-10-CM

## 2021-09-12 DIAGNOSIS — I4510 Unspecified right bundle-branch block: Secondary | ICD-10-CM | POA: Diagnosis present

## 2021-09-12 DIAGNOSIS — K219 Gastro-esophageal reflux disease without esophagitis: Secondary | ICD-10-CM | POA: Diagnosis present

## 2021-09-12 DIAGNOSIS — J156 Pneumonia due to other aerobic Gram-negative bacteria: Secondary | ICD-10-CM | POA: Diagnosis not present

## 2021-09-12 DIAGNOSIS — Z4682 Encounter for fitting and adjustment of non-vascular catheter: Secondary | ICD-10-CM | POA: Diagnosis not present

## 2021-09-12 DIAGNOSIS — E871 Hypo-osmolality and hyponatremia: Secondary | ICD-10-CM | POA: Diagnosis present

## 2021-09-12 DIAGNOSIS — Z9911 Dependence on respirator [ventilator] status: Secondary | ICD-10-CM | POA: Diagnosis not present

## 2021-09-12 DIAGNOSIS — J811 Chronic pulmonary edema: Secondary | ICD-10-CM | POA: Diagnosis not present

## 2021-09-12 DIAGNOSIS — F419 Anxiety disorder, unspecified: Secondary | ICD-10-CM | POA: Diagnosis present

## 2021-09-12 DIAGNOSIS — I950 Idiopathic hypotension: Secondary | ICD-10-CM | POA: Diagnosis not present

## 2021-09-12 DIAGNOSIS — Z7901 Long term (current) use of anticoagulants: Secondary | ICD-10-CM

## 2021-09-12 DIAGNOSIS — Z86718 Personal history of other venous thrombosis and embolism: Secondary | ICD-10-CM

## 2021-09-12 DIAGNOSIS — R4182 Altered mental status, unspecified: Secondary | ICD-10-CM | POA: Diagnosis not present

## 2021-09-12 DIAGNOSIS — Z6841 Body Mass Index (BMI) 40.0 and over, adult: Secondary | ICD-10-CM

## 2021-09-12 DIAGNOSIS — J690 Pneumonitis due to inhalation of food and vomit: Secondary | ICD-10-CM | POA: Diagnosis not present

## 2021-09-12 DIAGNOSIS — Z955 Presence of coronary angioplasty implant and graft: Secondary | ICD-10-CM

## 2021-09-12 DIAGNOSIS — Z8679 Personal history of other diseases of the circulatory system: Secondary | ICD-10-CM

## 2021-09-12 DIAGNOSIS — E785 Hyperlipidemia, unspecified: Secondary | ICD-10-CM | POA: Diagnosis present

## 2021-09-12 DIAGNOSIS — G928 Other toxic encephalopathy: Secondary | ICD-10-CM | POA: Diagnosis not present

## 2021-09-12 DIAGNOSIS — R6521 Severe sepsis with septic shock: Secondary | ICD-10-CM | POA: Diagnosis not present

## 2021-09-12 DIAGNOSIS — Z5320 Procedure and treatment not carried out because of patient's decision for unspecified reasons: Secondary | ICD-10-CM | POA: Diagnosis not present

## 2021-09-12 DIAGNOSIS — I2699 Other pulmonary embolism without acute cor pulmonale: Secondary | ICD-10-CM | POA: Diagnosis present

## 2021-09-12 DIAGNOSIS — Z888 Allergy status to other drugs, medicaments and biological substances status: Secondary | ICD-10-CM

## 2021-09-12 DIAGNOSIS — Z20822 Contact with and (suspected) exposure to covid-19: Secondary | ICD-10-CM | POA: Diagnosis not present

## 2021-09-12 DIAGNOSIS — T82590A Other mechanical complication of surgically created arteriovenous fistula, initial encounter: Secondary | ICD-10-CM | POA: Diagnosis not present

## 2021-09-12 DIAGNOSIS — I252 Old myocardial infarction: Secondary | ICD-10-CM

## 2021-09-12 DIAGNOSIS — I080 Rheumatic disorders of both mitral and aortic valves: Secondary | ICD-10-CM | POA: Diagnosis present

## 2021-09-12 DIAGNOSIS — J90 Pleural effusion, not elsewhere classified: Secondary | ICD-10-CM | POA: Diagnosis not present

## 2021-09-12 DIAGNOSIS — D631 Anemia in chronic kidney disease: Secondary | ICD-10-CM | POA: Diagnosis present

## 2021-09-12 DIAGNOSIS — J449 Chronic obstructive pulmonary disease, unspecified: Secondary | ICD-10-CM | POA: Diagnosis present

## 2021-09-12 DIAGNOSIS — Z789 Other specified health status: Secondary | ICD-10-CM | POA: Diagnosis not present

## 2021-09-12 DIAGNOSIS — I214 Non-ST elevation (NSTEMI) myocardial infarction: Secondary | ICD-10-CM | POA: Diagnosis not present

## 2021-09-12 DIAGNOSIS — J309 Allergic rhinitis, unspecified: Secondary | ICD-10-CM | POA: Diagnosis present

## 2021-09-12 DIAGNOSIS — E662 Morbid (severe) obesity with alveolar hypoventilation: Secondary | ICD-10-CM | POA: Diagnosis present

## 2021-09-12 DIAGNOSIS — Z66 Do not resuscitate: Secondary | ICD-10-CM | POA: Diagnosis not present

## 2021-09-12 DIAGNOSIS — R739 Hyperglycemia, unspecified: Secondary | ICD-10-CM | POA: Diagnosis not present

## 2021-09-12 DIAGNOSIS — Z9115 Patient's noncompliance with renal dialysis: Secondary | ICD-10-CM

## 2021-09-12 DIAGNOSIS — I358 Other nonrheumatic aortic valve disorders: Secondary | ICD-10-CM | POA: Diagnosis not present

## 2021-09-12 DIAGNOSIS — T8612 Kidney transplant failure: Secondary | ICD-10-CM | POA: Diagnosis present

## 2021-09-12 DIAGNOSIS — E1142 Type 2 diabetes mellitus with diabetic polyneuropathy: Secondary | ICD-10-CM | POA: Diagnosis not present

## 2021-09-12 DIAGNOSIS — E875 Hyperkalemia: Secondary | ICD-10-CM | POA: Diagnosis present

## 2021-09-12 DIAGNOSIS — J9622 Acute and chronic respiratory failure with hypercapnia: Secondary | ICD-10-CM | POA: Diagnosis present

## 2021-09-12 DIAGNOSIS — Z91014 Allergy to mammalian meats: Secondary | ICD-10-CM

## 2021-09-12 DIAGNOSIS — I9589 Other hypotension: Secondary | ICD-10-CM | POA: Diagnosis present

## 2021-09-12 DIAGNOSIS — E11649 Type 2 diabetes mellitus with hypoglycemia without coma: Secondary | ICD-10-CM | POA: Diagnosis not present

## 2021-09-12 DIAGNOSIS — Z992 Dependence on renal dialysis: Secondary | ICD-10-CM

## 2021-09-12 DIAGNOSIS — I2510 Atherosclerotic heart disease of native coronary artery without angina pectoris: Secondary | ICD-10-CM | POA: Diagnosis present

## 2021-09-12 DIAGNOSIS — I517 Cardiomegaly: Secondary | ICD-10-CM | POA: Diagnosis not present

## 2021-09-12 DIAGNOSIS — I255 Ischemic cardiomyopathy: Secondary | ICD-10-CM | POA: Diagnosis present

## 2021-09-12 DIAGNOSIS — Y832 Surgical operation with anastomosis, bypass or graft as the cause of abnormal reaction of the patient, or of later complication, without mention of misadventure at the time of the procedure: Secondary | ICD-10-CM | POA: Diagnosis present

## 2021-09-12 DIAGNOSIS — I4819 Other persistent atrial fibrillation: Secondary | ICD-10-CM | POA: Diagnosis present

## 2021-09-12 DIAGNOSIS — Z9842 Cataract extraction status, left eye: Secondary | ICD-10-CM

## 2021-09-12 DIAGNOSIS — Z940 Kidney transplant status: Secondary | ICD-10-CM

## 2021-09-12 DIAGNOSIS — E8779 Other fluid overload: Secondary | ICD-10-CM | POA: Diagnosis not present

## 2021-09-12 DIAGNOSIS — N2581 Secondary hyperparathyroidism of renal origin: Secondary | ICD-10-CM | POA: Diagnosis present

## 2021-09-12 DIAGNOSIS — E872 Acidosis, unspecified: Secondary | ICD-10-CM | POA: Diagnosis present

## 2021-09-12 DIAGNOSIS — Z91199 Patient's noncompliance with other medical treatment and regimen due to unspecified reason: Secondary | ICD-10-CM

## 2021-09-12 DIAGNOSIS — R34 Anuria and oliguria: Secondary | ICD-10-CM | POA: Diagnosis present

## 2021-09-12 DIAGNOSIS — Z711 Person with feared health complaint in whom no diagnosis is made: Secondary | ICD-10-CM | POA: Diagnosis not present

## 2021-09-12 DIAGNOSIS — I469 Cardiac arrest, cause unspecified: Secondary | ICD-10-CM | POA: Diagnosis not present

## 2021-09-12 DIAGNOSIS — I5042 Chronic combined systolic (congestive) and diastolic (congestive) heart failure: Secondary | ICD-10-CM | POA: Diagnosis present

## 2021-09-12 DIAGNOSIS — R0600 Dyspnea, unspecified: Secondary | ICD-10-CM | POA: Diagnosis not present

## 2021-09-12 DIAGNOSIS — Z961 Presence of intraocular lens: Secondary | ICD-10-CM | POA: Diagnosis present

## 2021-09-12 DIAGNOSIS — J9601 Acute respiratory failure with hypoxia: Secondary | ICD-10-CM | POA: Diagnosis not present

## 2021-09-12 DIAGNOSIS — I462 Cardiac arrest due to underlying cardiac condition: Secondary | ICD-10-CM | POA: Diagnosis not present

## 2021-09-12 DIAGNOSIS — Z79899 Other long term (current) drug therapy: Secondary | ICD-10-CM

## 2021-09-12 DIAGNOSIS — M4627 Osteomyelitis of vertebra, lumbosacral region: Secondary | ICD-10-CM | POA: Diagnosis not present

## 2021-09-12 DIAGNOSIS — R638 Other symptoms and signs concerning food and fluid intake: Secondary | ICD-10-CM | POA: Diagnosis not present

## 2021-09-12 DIAGNOSIS — R079 Chest pain, unspecified: Secondary | ICD-10-CM | POA: Diagnosis not present

## 2021-09-12 DIAGNOSIS — Z7902 Long term (current) use of antithrombotics/antiplatelets: Secondary | ICD-10-CM

## 2021-09-12 DIAGNOSIS — I499 Cardiac arrhythmia, unspecified: Secondary | ICD-10-CM | POA: Diagnosis not present

## 2021-09-12 DIAGNOSIS — Z9841 Cataract extraction status, right eye: Secondary | ICD-10-CM

## 2021-09-12 DIAGNOSIS — Z91013 Allergy to seafood: Secondary | ICD-10-CM

## 2021-09-12 DIAGNOSIS — T82818A Embolism of vascular prosthetic devices, implants and grafts, initial encounter: Secondary | ICD-10-CM | POA: Diagnosis present

## 2021-09-12 HISTORY — DX: Disorder of kidney and ureter, unspecified: N28.9

## 2021-09-12 HISTORY — PX: TEMPORARY PACEMAKER: CATH118268

## 2021-09-12 LAB — CBC WITH DIFFERENTIAL/PLATELET
Abs Immature Granulocytes: 0.04 10*3/uL (ref 0.00–0.07)
Basophils Absolute: 0 10*3/uL (ref 0.0–0.1)
Basophils Relative: 0 %
Eosinophils Absolute: 0.1 10*3/uL (ref 0.0–0.5)
Eosinophils Relative: 1 %
HCT: 43.2 % (ref 39.0–52.0)
Hemoglobin: 13.1 g/dL (ref 13.0–17.0)
Immature Granulocytes: 0 %
Lymphocytes Relative: 11 %
Lymphs Abs: 1.2 10*3/uL (ref 0.7–4.0)
MCH: 30.5 pg (ref 26.0–34.0)
MCHC: 30.3 g/dL (ref 30.0–36.0)
MCV: 100.5 fL — ABNORMAL HIGH (ref 80.0–100.0)
Monocytes Absolute: 0.5 10*3/uL (ref 0.1–1.0)
Monocytes Relative: 5 %
Neutro Abs: 8.5 10*3/uL — ABNORMAL HIGH (ref 1.7–7.7)
Neutrophils Relative %: 83 %
Platelets: 266 10*3/uL (ref 150–400)
RBC: 4.3 MIL/uL (ref 4.22–5.81)
RDW: 22.6 % — ABNORMAL HIGH (ref 11.5–15.5)
WBC: 10.3 10*3/uL (ref 4.0–10.5)
nRBC: 0.2 % (ref 0.0–0.2)

## 2021-09-12 LAB — COMPREHENSIVE METABOLIC PANEL
ALT: 15 U/L (ref 0–44)
AST: 37 U/L (ref 15–41)
Albumin: 3.4 g/dL — ABNORMAL LOW (ref 3.5–5.0)
Alkaline Phosphatase: 62 U/L (ref 38–126)
Anion gap: 15 (ref 5–15)
BUN: 35 mg/dL — ABNORMAL HIGH (ref 6–20)
CO2: 25 mmol/L (ref 22–32)
Calcium: 9.6 mg/dL (ref 8.9–10.3)
Chloride: 92 mmol/L — ABNORMAL LOW (ref 98–111)
Creatinine, Ser: 8.1 mg/dL — ABNORMAL HIGH (ref 0.61–1.24)
GFR, Estimated: 7 mL/min — ABNORMAL LOW (ref >60)
Glucose, Bld: 425 mg/dL — ABNORMAL HIGH (ref 70–99)
Potassium: 6 mmol/L — ABNORMAL HIGH (ref 3.5–5.1)
Sodium: 132 mmol/L — ABNORMAL LOW (ref 135–145)
Total Bilirubin: 1.6 mg/dL — ABNORMAL HIGH (ref 0.3–1.2)
Total Protein: 7.6 g/dL (ref 6.5–8.1)

## 2021-09-12 LAB — BASIC METABOLIC PANEL
Anion gap: 15 (ref 5–15)
Anion gap: 15 (ref 5–15)
BUN: 42 mg/dL — ABNORMAL HIGH (ref 6–20)
BUN: 41 mg/dL — ABNORMAL HIGH (ref 6–20)
CO2: 24 mmol/L (ref 22–32)
CO2: 26 mmol/L (ref 22–32)
Calcium: 8.9 mg/dL (ref 8.9–10.3)
Calcium: 9.2 mg/dL (ref 8.9–10.3)
Chloride: 91 mmol/L — ABNORMAL LOW (ref 98–111)
Chloride: 90 mmol/L — ABNORMAL LOW (ref 98–111)
Creatinine, Ser: 8.54 mg/dL — ABNORMAL HIGH (ref 0.61–1.24)
Creatinine, Ser: 8.48 mg/dL — ABNORMAL HIGH (ref 0.61–1.24)
GFR, Estimated: 7 mL/min — ABNORMAL LOW (ref >60)
GFR, Estimated: 7 mL/min — ABNORMAL LOW (ref >60)
Glucose, Bld: 485 mg/dL — ABNORMAL HIGH (ref 70–99)
Glucose, Bld: 472 mg/dL — ABNORMAL HIGH (ref 70–99)
Potassium: 6.3 mmol/L (ref 3.5–5.1)
Potassium: 6.5 mmol/L (ref 3.5–5.1)
Sodium: 130 mmol/L — ABNORMAL LOW (ref 135–145)
Sodium: 131 mmol/L — ABNORMAL LOW (ref 135–145)

## 2021-09-12 LAB — POCT I-STAT 7, (LYTES, BLD GAS, ICA,H+H)
Acid-Base Excess: 3 mmol/L — ABNORMAL HIGH (ref 0.0–2.0)
Bicarbonate: 28.7 mmol/L — ABNORMAL HIGH (ref 20.0–28.0)
Calcium, Ion: 1.14 mmol/L — ABNORMAL LOW (ref 1.15–1.40)
HCT: 37 % — ABNORMAL LOW (ref 39.0–52.0)
Hemoglobin: 12.6 g/dL — ABNORMAL LOW (ref 13.0–17.0)
O2 Saturation: 100 %
Potassium: 6.6 mmol/L (ref 3.5–5.1)
Sodium: 126 mmol/L — ABNORMAL LOW (ref 135–145)
TCO2: 30 mmol/L (ref 22–32)
pCO2 arterial: 45.4 mmHg (ref 32.0–48.0)
pH, Arterial: 7.409 (ref 7.350–7.450)
pO2, Arterial: 328 mmHg — ABNORMAL HIGH (ref 83.0–108.0)

## 2021-09-12 LAB — GLUCOSE, CAPILLARY
Glucose-Capillary: 397 mg/dL — ABNORMAL HIGH (ref 70–99)
Glucose-Capillary: 420 mg/dL — ABNORMAL HIGH (ref 70–99)
Glucose-Capillary: 451 mg/dL — ABNORMAL HIGH (ref 70–99)

## 2021-09-12 LAB — I-STAT VENOUS BLOOD GAS, ED
Acid-Base Excess: 3 mmol/L — ABNORMAL HIGH (ref 0.0–2.0)
Bicarbonate: 30.4 mmol/L — ABNORMAL HIGH (ref 20.0–28.0)
Calcium, Ion: 1.12 mmol/L — ABNORMAL LOW (ref 1.15–1.40)
HCT: 44 % (ref 39.0–52.0)
Hemoglobin: 15 g/dL (ref 13.0–17.0)
O2 Saturation: 33 %
Potassium: 5.8 mmol/L — ABNORMAL HIGH (ref 3.5–5.1)
Sodium: 132 mmol/L — ABNORMAL LOW (ref 135–145)
TCO2: 32 mmol/L (ref 22–32)
pCO2, Ven: 55.6 mmHg (ref 44.0–60.0)
pH, Ven: 7.346 (ref 7.250–7.430)
pO2, Ven: 22 mmHg — CL (ref 32.0–45.0)

## 2021-09-12 LAB — RESP PANEL BY RT-PCR (FLU A&B, COVID) ARPGX2
Influenza A by PCR: NEGATIVE
Influenza B by PCR: NEGATIVE
SARS Coronavirus 2 by RT PCR: NEGATIVE

## 2021-09-12 LAB — PROCALCITONIN: Procalcitonin: 1.23 ng/mL

## 2021-09-12 LAB — I-STAT CREATININE, ED: Creatinine, Ser: 8.1 mg/dL — ABNORMAL HIGH (ref 0.61–1.24)

## 2021-09-12 LAB — PROTIME-INR
INR: 1.5 — ABNORMAL HIGH (ref 0.8–1.2)
Prothrombin Time: 18.3 seconds — ABNORMAL HIGH (ref 11.4–15.2)

## 2021-09-12 LAB — CBG MONITORING, ED: Glucose-Capillary: 397 mg/dL — ABNORMAL HIGH (ref 70–99)

## 2021-09-12 LAB — LACTIC ACID, PLASMA: Lactic Acid, Venous: 2.1 mmol/L (ref 0.5–1.9)

## 2021-09-12 LAB — BRAIN NATRIURETIC PEPTIDE: B Natriuretic Peptide: 3842.7 pg/mL — ABNORMAL HIGH (ref 0.0–100.0)

## 2021-09-12 LAB — TROPONIN I (HIGH SENSITIVITY)
Troponin I (High Sensitivity): 4493 ng/L (ref <18)
Troponin I (High Sensitivity): 5596 ng/L (ref <18)

## 2021-09-12 LAB — MAGNESIUM: Magnesium: 2.6 mg/dL — ABNORMAL HIGH (ref 1.7–2.4)

## 2021-09-12 LAB — BETA-HYDROXYBUTYRIC ACID: Beta-Hydroxybutyric Acid: 0.87 mmol/L — ABNORMAL HIGH (ref 0.05–0.27)

## 2021-09-12 LAB — MRSA NEXT GEN BY PCR, NASAL: MRSA by PCR Next Gen: NOT DETECTED

## 2021-09-12 SURGERY — TEMPORARY PACEMAKER
Anesthesia: LOCAL

## 2021-09-12 MED ORDER — HEPARIN (PORCINE) IN NACL 1000-0.9 UT/500ML-% IV SOLN
INTRAVENOUS | Status: AC
  Filled 2021-09-12: qty 1000

## 2021-09-12 MED ORDER — ONDANSETRON HCL 4 MG/2ML IJ SOLN: 4.0000 mg | Freq: Once | INTRAMUSCULAR | Status: DC

## 2021-09-12 MED ORDER — SODIUM BICARBONATE 8.4 % IV SOLN
50.0000 meq | Freq: Once | INTRAVENOUS | Status: AC
  Administered 2021-09-12: 50 meq via INTRAVENOUS

## 2021-09-12 MED ORDER — LIDOCAINE HCL (PF) 1 % IJ SOLN
INTRAMUSCULAR | Status: DC | PRN
  Administered 2021-09-12 (×2): 10 mL

## 2021-09-12 MED ORDER — ONDANSETRON HCL 4 MG/2ML IJ SOLN
INTRAMUSCULAR | Status: AC
  Administered 2021-09-12: 4 mg via INTRAVENOUS
  Filled 2021-09-12: qty 2

## 2021-09-12 MED ORDER — EPINEPHRINE HCL 5 MG/250ML IV SOLN IN NS
INTRAVENOUS | Status: AC
  Filled 2021-09-12: qty 250

## 2021-09-12 MED ORDER — HEPARIN (PORCINE) IN NACL 1000-0.9 UT/500ML-% IV SOLN
INTRAVENOUS | Status: DC | PRN
  Administered 2021-09-12: 500 mL

## 2021-09-12 MED ORDER — NOREPINEPHRINE 4 MG/250ML-% IV SOLN
0.0000 ug/min | INTRAVENOUS | Status: DC
  Administered 2021-09-12: 10 ug/min via INTRAVENOUS
  Administered 2021-09-12: 15 ug/min via INTRAVENOUS
  Filled 2021-09-12: qty 250

## 2021-09-12 MED ORDER — ONDANSETRON HCL 4 MG/2ML IJ SOLN
INTRAMUSCULAR | Status: DC | PRN
  Administered 2021-09-12: 4 mg via INTRAVENOUS

## 2021-09-12 MED ORDER — CALCIUM GLUCONATE 10 % IV SOLN
1.0000 g | Freq: Once | INTRAVENOUS | Status: AC
  Administered 2021-09-12: 1 g via INTRAVENOUS
  Filled 2021-09-12: qty 10

## 2021-09-12 MED ORDER — NOREPINEPHRINE 16 MG/250ML-% IV SOLN
0.0000 ug/min | INTRAVENOUS | Status: DC
  Administered 2021-09-13: 9 ug/min via INTRAVENOUS
  Administered 2021-09-13: 6 ug/min via INTRAVENOUS
  Administered 2021-09-14: 5 ug/min via INTRAVENOUS
  Administered 2021-09-15: 1.995 ug/min via INTRAVENOUS
  Filled 2021-09-12 (×3): qty 250

## 2021-09-12 MED ORDER — LIDOCAINE HCL (PF) 1 % IJ SOLN
INTRAMUSCULAR | Status: AC
  Filled 2021-09-12: qty 30

## 2021-09-12 MED ORDER — TICAGRELOR 90 MG PO TABS
90.0000 mg | ORAL_TABLET | Freq: Two times a day (BID) | ORAL | Status: DC
  Administered 2021-09-13 – 2021-09-14 (×3): 90 mg via ORAL
  Filled 2021-09-12 (×3): qty 1

## 2021-09-12 MED ORDER — CHLORHEXIDINE GLUCONATE CLOTH 2 % EX PADS
6.0000 | MEDICATED_PAD | Freq: Every day | CUTANEOUS | Status: DC
  Administered 2021-09-13 – 2021-09-21 (×8): 6 via TOPICAL

## 2021-09-12 MED ORDER — FENTANYL CITRATE PF 50 MCG/ML IJ SOSY
PREFILLED_SYRINGE | INTRAMUSCULAR | Status: AC
  Administered 2021-09-12: 50 ug via INTRAVENOUS
  Filled 2021-09-12: qty 1

## 2021-09-12 MED ORDER — PANTOPRAZOLE SODIUM 40 MG IV SOLR
40.0000 mg | Freq: Every day | INTRAVENOUS | Status: DC
  Administered 2021-09-12 – 2021-09-13 (×2): 40 mg via INTRAVENOUS
  Filled 2021-09-12 (×2): qty 40

## 2021-09-12 MED ORDER — INSULIN ASPART 100 UNIT/ML IV SOLN
5.0000 [IU] | Freq: Once | INTRAVENOUS | Status: AC
  Administered 2021-09-12: 5 [IU] via INTRAVENOUS

## 2021-09-12 MED ORDER — ONDANSETRON HCL 4 MG/2ML IJ SOLN
4.0000 mg | Freq: Four times a day (QID) | INTRAMUSCULAR | Status: AC | PRN
  Administered 2021-09-13 – 2021-09-15 (×2): 4 mg via INTRAVENOUS
  Filled 2021-09-12 (×2): qty 2

## 2021-09-12 MED ORDER — MELATONIN 3 MG PO TABS
3.0000 mg | ORAL_TABLET | Freq: Once | ORAL | Status: AC
  Administered 2021-09-13: 3 mg via ORAL
  Filled 2021-09-12: qty 1

## 2021-09-12 MED ORDER — SODIUM ZIRCONIUM CYCLOSILICATE 10 G PO PACK
10.0000 g | PACK | Freq: Once | ORAL | Status: AC
  Administered 2021-09-12: 10 g via ORAL
  Filled 2021-09-12: qty 1

## 2021-09-12 MED ORDER — FENTANYL CITRATE PF 50 MCG/ML IJ SOSY: 50.0000 ug | PREFILLED_SYRINGE | Freq: Once | INTRAMUSCULAR | Status: AC

## 2021-09-12 MED ORDER — POLYETHYLENE GLYCOL 3350 17 G PO PACK: 17.0000 g | PACK | Freq: Every day | ORAL | Status: DC | PRN

## 2021-09-12 MED ORDER — INSULIN ASPART 100 UNIT/ML IJ SOLN
1.0000 [IU] | INTRAMUSCULAR | Status: DC
Start: 2021-09-12 — End: 2021-09-13
  Administered 2021-09-12 – 2021-09-13 (×3): 3 [IU] via SUBCUTANEOUS

## 2021-09-12 MED ORDER — SODIUM CHLORIDE 0.9% FLUSH: 10.0000 mL | INTRAVENOUS | Status: DC | PRN

## 2021-09-12 MED ORDER — ACETAMINOPHEN 160 MG/5ML PO SOLN
650.0000 mg | ORAL | Status: AC | PRN
  Administered 2021-09-12: 650 mg
  Filled 2021-09-12: qty 20.3

## 2021-09-12 MED ORDER — DOCUSATE SODIUM 100 MG PO CAPS: 100.0000 mg | ORAL_CAPSULE | Freq: Two times a day (BID) | ORAL | Status: DC | PRN

## 2021-09-12 MED ORDER — NOREPINEPHRINE 4 MG/250ML-% IV SOLN
0.0000 ug/min | INTRAVENOUS | Status: DC
  Administered 2021-09-12: 10 ug/min via INTRAVENOUS
  Filled 2021-09-12: qty 250

## 2021-09-12 MED ORDER — ASPIRIN 325 MG PO TABS
325.0000 mg | ORAL_TABLET | Freq: Every day | ORAL | Status: DC
  Filled 2021-09-12: qty 1

## 2021-09-12 MED ORDER — HEPARIN SODIUM (PORCINE) 5000 UNIT/ML IJ SOLN: 5000.0000 [IU] | Freq: Three times a day (TID) | INTRAMUSCULAR | Status: DC

## 2021-09-12 MED ORDER — ONDANSETRON HCL 4 MG/2ML IJ SOLN
4.0000 mg | Freq: Once | INTRAMUSCULAR | Status: DC
  Filled 2021-09-12: qty 2

## 2021-09-12 MED ORDER — ATORVASTATIN CALCIUM 80 MG PO TABS
80.0000 mg | ORAL_TABLET | Freq: Every day | ORAL | Status: DC
  Administered 2021-09-12 – 2021-09-14 (×3): 80 mg via ORAL
  Filled 2021-09-12 (×3): qty 1

## 2021-09-12 MED ORDER — HEPARIN (PORCINE) 25000 UT/250ML-% IV SOLN
1100.0000 [IU]/h | INTRAVENOUS | Status: DC
  Administered 2021-09-12: 1350 [IU]/h via INTRAVENOUS
  Administered 2021-09-14: 2000 [IU]/h via INTRAVENOUS
  Administered 2021-09-15 – 2021-09-19 (×11): 2150 [IU]/h via INTRAVENOUS
  Administered 2021-09-20: 1900 [IU]/h via INTRAVENOUS
  Administered 2021-09-20: 1600 [IU]/h via INTRAVENOUS
  Filled 2021-09-12 (×18): qty 250

## 2021-09-12 MED ORDER — ASPIRIN 81 MG PO CHEW
81.0000 mg | CHEWABLE_TABLET | Freq: Every day | ORAL | Status: DC
  Administered 2021-09-13 – 2021-09-16 (×4): 81 mg via ORAL
  Filled 2021-09-12 (×4): qty 1

## 2021-09-12 MED ORDER — INSULIN GLARGINE-YFGN 100 UNIT/ML ~~LOC~~ SOLN
20.0000 [IU] | Freq: Every day | SUBCUTANEOUS | Status: DC
  Administered 2021-09-12: 20 [IU] via SUBCUTANEOUS
  Filled 2021-09-12 (×2): qty 0.2

## 2021-09-12 SURGICAL SUPPLY — 10 items
CABLE ADAPT PACING TEMP 12FT (ADAPTER) ×3 IMPLANT
CATH S G BIP PACING (CATHETERS) ×3 IMPLANT
KIT MICROPUNCTURE NIT STIFF (SHEATH) ×3 IMPLANT
MAT PREVALON FULL STRYKER (MISCELLANEOUS) ×3 IMPLANT
PACK CARDIAC CATHETERIZATION (CUSTOM PROCEDURE TRAY) ×3 IMPLANT
SHEATH BRITE TIP 7FR 35CM (SHEATH) ×3 IMPLANT
SHEATH PINNACLE 7F 10CM (SHEATH) ×3 IMPLANT
SHEATH PROBE COVER 6X72 (BAG) ×3 IMPLANT
SLEEVE REPOSITIONING LENGTH 30 (MISCELLANEOUS) ×3 IMPLANT
WIRE EMERALD 3MM-J .035X150CM (WIRE) ×3 IMPLANT

## 2021-09-12 NOTE — ED Provider Notes (Addendum)
  Physical Exam  BP (!) 88/48   Pulse 87   Temp 98.2 F (36.8 C) (Axillary)   Resp 19   SpO2 100%   Physical Exam  ED Course/Procedures     .Critical Care Performed by: Varney Biles, MD Authorized by: Varney Biles, MD   Critical care provider statement:    Critical care time (minutes):  36   Critical care was necessary to treat or prevent imminent or life-threatening deterioration of the following conditions:  Cardiac failure, circulatory failure, renal failure and respiratory failure   Critical care was time spent personally by me on the following activities:  Development of treatment plan with patient or surrogate, discussions with consultants, evaluation of patient's response to treatment, examination of patient, ordering and review of laboratory studies, ordering and review of radiographic studies, ordering and performing treatments and interventions, pulse oximetry, re-evaluation of patient's condition and review of old charts  MDM    I am assuming care of this patient around 3:30 PM from Dr. Tyrone Nine.  Patient with history of CAD, advanced CHF, pulmonary hypertension, DVT on Coumadin and ESRD on HD comes in with chief complaint of multiple syncopal episodes.  Patient was noted to be hypotensive and profoundly bradycardic at arrival.  Transcutaneous pacing was initiated.  Cardiology consult was placed.  Work-up reveals mild hyper-K, normal white count, elevated troponin over 4000.  While awaiting cardiac consultation, patient was removed from the transcutaneous pacing.  His blood pressure remained low.  Cardiology recommends that patient be admitted to medicine service.  Arterial line was placed-it was noted that patient was perfusing about 30 to 40 bpm.  His blood pressure was profoundly low.  Bedside echocardiogram reveals dilated right ventricle.  I have reviewed patient's recent catheterization and echocardiogram.  It appears that at baseline he has elevated RV  pressures and dilated right ventricle.  CCM was consulted.  With profound hypotension, norepinephrine drip was initiated.  For hyper-K patient has received calcium, insulin, sodium bicarb.  I have re-paged cardiology because of his bradycardia.    Varney Biles, MD 10/04/2021 1621  4:47 PM Dr. Carolin Sicks - Nephrology notified. Cardiology taking patient to cath lab - so deferring additional access to admitting service.    Varney Biles, MD 10/07/[id number]

## 2021-09-12 NOTE — ED Notes (Addendum)
Md notified of trop in the 4000's.  PT continues to go in and out of consciousness.  Long pauses noted.  Ma increased to 64 by MD.

## 2021-09-12 NOTE — ED Notes (Signed)
PT belongings sent with him to cath lab, including his wallet, per his request.

## 2021-09-12 NOTE — ED Notes (Addendum)
Pt bp dropping.  Dr Tyrone Nine at bedside.  MD does not feel pressures are accurate.  Pt mentation improving, per MD. RT called and will place an art line.

## 2021-09-12 NOTE — Progress Notes (Signed)
Increase dose today to 1 tablet and increase dose tomorrow to 1 1/2 tablets and then change weekly dose to take 1 tablet daily except take 1/2 tablet on Mon, Wed, Fri. Recheck on Thurs of next week at dialysis. T

## 2021-09-12 NOTE — ED Notes (Signed)
Lab results was reported to Nurse [place].

## 2021-09-12 NOTE — ED Notes (Signed)
Also, no phone noted in pants pockets that were cut off.

## 2021-09-12 NOTE — Progress Notes (Signed)
ANTICOAGULATION CONSULT NOTE - Initial Consult  Pharmacy Consult:  Heparin Indication: atrial fibrillation  No Known Allergies  Patient Measurements: Height: 5\' 10"  (177.8 cm) Weight: 132 kg (291 lb 0.1 oz) IBW/kg (Calculated) : 73 Heparin Dosing Weight: 104 kg  Vital Signs: Temp: 98.2 F (36.8 C) (12/02 1425) Temp Source: Axillary (12/02 1425) BP: 134/58 (12/02 1615) Pulse Rate: 0 (12/02 1919)  Labs: Recent Labs    09/16/2021 1425 09/11/2021 1432 10/05/2021 1436 09/26/2021 1608  HGB 13.1  --  15.0 12.6*  HCT 43.2  --  44.0 37.0*  PLT 266  --   --   --   LABPROT 18.3*  --   --   --   INR 1.5*  --   --   --   CREATININE 8.10* 8.10*  --   --   TROPONINIHS 4,493*  --   --   --     Estimated Creatinine Clearance: 13.7 mL/min (A) (by C-G formula based on SCr of 8.1 mg/dL (H)).   Assessment: 76 YOM presented with nausea and vomiting for 2 days and syncope.  Patient was hypotensive and bradycardic, requiring transcutaneous pacing.  S/p cath with temporary transvenous pacemaker insertion.  Pharmacy consulted to start IV heparin while Coumadin is on hold.  INR is sub-therapeutic on admit.  Goal of Therapy:  Heparin level 0.3-0.7 units/ml Monitor platelets by anticoagulation protocol: Yes   Plan:  D/C heparin SQ - ok to start IV heparin now per discussion with MD Initiate heparin gtt at 1350 units/hr - no bolus  Check 8 hr heparin level Daily heparin level and CBC  Javarius Tsosie D. Mina Marble, PharmD, BCPS, [place] 10/05/2021, 7:38 PM

## 2021-09-12 NOTE — Patient Instructions (Addendum)
Pre visit review using our clinic review tool, if applicable. No additional management support is needed unless otherwise documented below in the visit note.  Increase dose today to 1 tablet and increase dose tomorrow to 1 1/2 tablets and then change weekly dose to take 1 tablet daily except take 1/2 tablet on Mon, Wed, Fri. Recheck on Thurs of next week at dialysis.

## 2021-09-12 NOTE — Progress Notes (Signed)
eLink Physician-Brief Progress Note Patient Name: [name] [name] DOB: 1966-04-09 MRN: [id number]   Date of Service  09/28/2021  HPI/Events of Note  Patient requesting medication for anxiety / sleep.  eICU Interventions  Melatonin 3 mg po x 1 ordered.        [name] Magally Vahle 10/09/2021, 11:35 PM

## 2021-09-12 NOTE — H&P (Addendum)
NAME:  [name] [name], MRN:  [id number], DOB:  09-11-1966, LOS: 0 ADMISSION DATE:  10/04/2021, CONSULTATION DATE:  10/07/2021 REFERRING MD:  Kathrynn Humble, CHIEF COMPLAINT:  syncope   History of Present Illness:  55yM with ESRD on HD via AVF, advanced CHF, pulmonary hypertension, DVT on warfarin who had had nausea and vomiting for few days, R sided CP yesterday and then syncope today. He was BIBEMS who found him to have HR in 30s to 40s, transcutaneous pacing started en route.   In ED arterial line placed, trop 4k, K found to be 6.6 on istat, 1g Ca gluconate, 2 amps bicarb given. Levo drip titrated down to 10 mcg/min. Remained dependent on transcutaneous pacing however, EP planning to place temporary transvenous pacemaker.   Pertinent  Medical History  ESRD DVT Chronic systolic and diastolic heart failure Multifactorial pulmonary hypertension CAD OSA not using CPAP  Significant Hospital Events: Including procedures, antibiotic start and stop dates in addition to other pertinent events   09/26/2021 arterial line placed, transcutaneous pacing, plan for transvenous pacemaker placement in lab  Interim History / Subjective:    Objective   Blood pressure (!) 103/36, pulse 61, temperature 98.2 F (36.8 C), temperature source Axillary, resp. rate (!) 27, height 5\' 10"  (1.778 m), weight 132 kg, SpO2 95 %.       No intake or output data in the 24 hours ending 09/14/2021 1648 Filed Weights   10/10/2021 1618  Weight: 132 kg    Examination: General appearance: 55 y.o., male, drowsy but arousable with loud verbal stimulation Eyes: PERRL, tracking appropriately HENT: NCAT; dry MM Lungs: Rhonchorous bilaterally, no crackles, no wheeze, with normal respiratory effort CV: bradycardic transcutaneously paced at 60 with zoll, no murmur  Abdomen: Soft, non-tender; non-distended, BS present  Extremities:trace peripheral edema, cool Neuro: grossly nonfocal, inattentive but perks up with loud verbal  stimulation   Resolved Hospital Problem list    Assessment & Plan:   # Cardiogenic shock # Unstable bradycardia # NSTEMI # Syncope - full dose ASA, heparin gtt - TTE - trend trop - EP planning for temp wire - emergent dialysis, nephrology consulted - transition to epi if remains on levo post PM placement  # Acute on chronic hypoxic respiratory failure: - declines CPAP - consideration of CTA chest if hypoxia not convincingly related to sleep-disordered breathing/refusal of NRB, or if not responsive to UF with dialysis  # WCT: Noted post TCP - looks like accelerated idioventricular rhythm, CTM  # Acute encephalopathy: Likely due to medication effect (opioid) in setting transcutaneous pacing, hypoperfusion - CTM  # Chronic systolic and diastolic heart failure due to ICM: - not any other gdmt at home - UF with dialysis  # CAD - continue brilinta 90 BID - home atorvastatin 80  # AF - holding home amiodarone for now - heparin gtt as above  # Chronic hypotension: - hold home midodrine 10 TID  # ESRD: # Hyperkalemia  # DVT - hold warfarin - heparin gtt   # DM2 - check BHB - lantus 20 now, will likely need more  - SSI  # Benzodiazepine use: - clarify whether dependent in AM when more alert     Best Practice (right click and "Reselect all SmartList Selections" daily)   Diet/type: NPO w/ oral meds DVT prophylaxis: systemic heparin GI prophylaxis: PPI Lines: Central line Foley:  N/A Code Status:  full code Last date of multidisciplinary goals of care discussion [will attempt to reach out to family today]  Labs   CBC: Recent Labs  Lab 09/14/2021 1425 10/08/2021 1436  WBC 10.3  --   NEUTROABS 8.5*  --   HGB 13.1 15.0  HCT 43.2 44.0  MCV 100.5*  --   PLT 266  --     Basic Metabolic Panel: Recent Labs  Lab 09/24/2021 1425 10/10/2021 1430 10/02/2021 1432 10/08/2021 1436  NA 132*  --   --  132*  K 6.0*  --   --  5.8*  CL 92*  --   --   --   CO2 25  --    --   --   GLUCOSE 425*  --   --   --   BUN 35*  --   --   --   CREATININE 8.10*  --  8.10*  --   CALCIUM 9.6  --   --   --   MG  --  2.6*  --   --    GFR: Estimated Creatinine Clearance: 13.7 mL/min (A) (by C-G formula based on SCr of 8.1 mg/dL (H)). Recent Labs  Lab 10/07/2021 1425  WBC 10.3    Liver Function Tests: Recent Labs  Lab 10/01/2021 1425  AST 37  ALT 15  ALKPHOS 62  BILITOT 1.6*  PROT 7.6  ALBUMIN 3.4*   No results for input(s): LIPASE, AMYLASE in the last 168 hours. No results for input(s): AMMONIA in the last 168 hours.  ABG    Component Value Date/Time   HCO3 30.4 (H) 10/11/2021 1436   TCO2 32 09/17/2021 1436   O2SAT 33.0 09/21/2021 1436     Coagulation Profile: Recent Labs  Lab 10/02/2021 1425  INR 1.5*    Cardiac Enzymes: No results for input(s): CKTOTAL, CKMB, CKMBINDEX, TROPONINI in the last 168 hours.  HbA1C: No results found for: HGBA1C  CBG: Recent Labs  Lab 09/30/2021 1434  GLUCAP 397*    Review of Systems:   Unable to obtain in setting of acute encephalopathy  Past Medical History:  He,  has a past medical history of Renal disorder.   Surgical History:  History reviewed. No pertinent surgical history.   Social History:      Family History:  His family history is not on file.   Allergies No Known Allergies   Home Medications  Prior to Admission medications   Not on File     Critical care time: 45 minutes

## 2021-09-12 NOTE — Procedures (Signed)
Arterial Catheter Insertion Procedure Note  [name]  [id number]  1966/03/26  Date:09/19/2021  Time:4:12 PM    Provider Performing: Esperanza Sheets T    Procedure: Insertion of Arterial Line [phone]) without US guidance  Indication(s) Blood pressure monitoring and/or need for frequent ABGs  Consent Unable to obtain consent due to emergent nature of procedure.  Anesthesia None   Time Out Verified patient identification, verified procedure, site/side was marked, verified correct patient position, special equipment/implants available, medications/allergies/relevant history reviewed, required imaging and test results available.   Sterile Technique Maximal sterile technique including full sterile barrier drape, hand hygiene, sterile gown, sterile gloves, mask, hair covering, sterile ultrasound probe cover (if used).   Procedure Description Area of catheter insertion was cleaned with chlorhexidine and draped in sterile fashion. Without real-time ultrasound guidance an arterial catheter was placed into the right radial artery.  Appropriate arterial tracings confirmed on monitor.     Complications/Tolerance None; patient tolerated the procedure well.   EBL Minimal   Specimen(s) None

## 2021-09-12 NOTE — Consult Note (Addendum)
ELECTROPHYSIOLOGY CONSULT NOTE    Patient ID: [name] [name] MRN: [id number], DOB/AGE: [id number] y.o.  Admit date: 10/07/2021 Date of Consult: 09/19/2021  Primary Physician: No primary care provider on file. Primary Cardiologist: Dr. Angelena Form Electrophysiologist: Seen once by Dr. Rayann Heman  Referring Provider: Dr. Tyrone Nine  Patient Profile: [name] [name] is a 55 y.o. male with a history of ESRD on HD, COPD, AI, RBBB, OSA, DMII, persistent atrial flutter, chronic OAC on coumadin, chronic DVT, chronic diastolic CHF, h/o endocarditis in 2016 who is being seen today for the evaluation of syncope and bradycardia at the request of Dr. Tyrone Nine.  Pt chest pain in May 2022 and mildly elevated troponins while being admitted for dialysis catheter exchange. Cardiac cath 02/17/21 with occlusion of the LAD beyond the stented segment. DES placed. The intermediate branch and the small obtuse marginal branch were totally occluded, both filling from right to left collaterals. Echo 02/15/21 with LVEF 25-30%, moderate LVH, moderate RV dysfunction. Mild MR. Mild AI.   Repeat echo 05/02/21 with LVEF=25-30% with hypokinesis of the anterior and apical walls.   Admitted August 2022 for creation of left arm dialysis fistula. He had a PEA arrest following the procedure felt to be due to hypercarbia. He developed atrial fib with RVR during the hospitalization and had a TEE guided cardioversion. TTE at [facility] 05/27/21 with LVEF=45-50%. Moderate MR.   HPI:  [name] [name] is a 55 y.o. male with complicated medical history as above who presented with syncopal episode with N/V at home for 2 days.   Last seen in [facility] office 06/09/21. Was doing well at that time. HR regular on exam, but no EKG.   Pt previously seen by EP team 07/2020 for AFL with slow ventricular rate and intermittent AV block.  His rates improved to 50-60s on dopamine and he ultimately was weaned off and remained stable. He has bilateral fistulas and history of  bacteremia and was felt to overall be a poor PPM candidate, though rates improved during that visit as his acute illness did, with no urgent indication for pacing.   Today, He presented to [facility] as above with several days of R sided chest discomfort, nausea and vomiting.  Today he had 2 syncopal episodes. Called EMS and found to have HR in the 30-40s with intermittent AV block of unclear degree (at least 2nd) per report.   On arrival ABG shows PO2 22.0, Bicarb 30.4, ACid Base 3.0, Other parameters WNL.   Placed on NRB.  Cr 8.1 (ESRD), Mg 2.6, K 5.8, WBC 10.3, Hgb 13.1 HS trop 4493  Currently, pt is somewhat somnolent on NRB. He answers questions then falls asleep. He knows he's in the hospital and has had symptoms as above for several days. He vaguely remembers seeing [place] last year in October and discussing pacing at that time. States his R fistula is clogged and non-functioning. L fistula is newly constructed (05/2021).    Currently his HRs are in the 70-80s  Past Medical History:  Diagnosis Date   Allergic rhinitis     Allergy     Anemia     Aortic insufficiency      a. Echo 7/16:  Mod LVH, EF 50-55%, mild to mod AI, severe LAE, PASP 57 mmHg (LV ID end diastolic 45.6 mm);  b. Echo 2/17:  Mod LVH, EF 50-55%, mod AI, MAC, mild MR, mod LAE, mild to mod TR, PASP 63 mmHg   Bilateral carpal tunnel syndrome     Cataract  removed both eyes    CHF (congestive heart failure) (HCC)     Claustrophobia     COPD (chronic obstructive pulmonary disease) (HCC)     Diabetes mellitus      Type II   Diabetic peripheral neuropathy (HCC)     Diabetic retinopathy ([place])     Discitis of lumbar region      resolved   DVT (deep venous thrombosis) ([place]) 10/07/2015    LLE   Endocarditis 11/28/14   ESRD (end stage renal disease) on dialysis [facility])      s/p renal transplant in 2009, failed in 2016   Gait disorder     Gastroparesis     GERD (gastroesophageal reflux disease)     Gout     Hearing difficulty      Hemodialysis patient ([place])      tuesday, thursday, saturday    Hyperlipidemia     Hypertension     Incomplete bladder emptying     Leg pain 02/05/11    with walking   Morbid obesity ([place])     Obstructive sleep apnea      not using CPAP- lost 100lbs   Osteomyelitis (HCC)     Posttraumatic stress disorder     Rotator cuff disorder     Secondary hyperparathyroidism ([place])     Sepsis ([place])      Postive blood cultures -    Sleep apnea      no cpap   Urethral stricture     Vitamin D deficiency        Surgical History:       Past Surgical History:  Procedure Laterality Date   ARTERIOVENOUS GRAFT PLACEMENT Bilateral "several"   AV FISTULA PLACEMENT Bilateral 2015   AV FISTULA PLACEMENT Right 06/13/2019    Procedure: INSERTION OF ARTERIOVENOUS (AV) GORE-TEX GRAFT RIGHT  ARM;  Surgeon: Elam Dutch, MD;  Location: [place];  Service: Vascular;  Laterality: Right;   [place] Right 16/04/3709   BASCILIC VEIN TRANSPOSITION Right 09/27/2015    Procedure: BASILIC VEIN TRANSPOSITION right;  Surgeon: Mal Misty, MD;  Location: [place];  Service: Vascular;  Laterality: Right;   CATARACT EXTRACTION W/ INTRAOCULAR LENS  IMPLANT, BILATERAL Bilateral     CORONARY STENT INTERVENTION N/A 07/27/2020    Procedure: CORONARY STENT INTERVENTION;  Surgeon: Burnell Blanks, MD;  Location: [place] CV LAB;  Service: Cardiovascular;  Laterality: N/A;   CYSTOSCOPY W/ INTERNAL URETHROTOMY   09/2014   IR THROMBECTOMY AV FISTULA W/THROMBOLYSIS/PTA INC/SHUNT/IMG RIGHT Right 08/01/2019   IR US GUIDE VASC ACCESS RIGHT   08/01/2019   KIDNEY TRANSPLANT   2009   LEFT HEART CATH AND CORONARY ANGIOGRAPHY N/A 07/27/2020    Procedure: LEFT HEART CATH AND CORONARY ANGIOGRAPHY;  Surgeon: Burnell Blanks, MD;  Location: [place] [facility];  Service: Cardiovascular;  Laterality: N/A;   SIGMOIDOSCOPY       UPPER EXTREMITY VENOGRAPHY Bilateral 06/02/2019    Procedure: UPPER EXTREMITY  VENOGRAPHY;  Surgeon: Elam Dutch, MD;  Location: [place] CV [facility];  Service: Cardiovascular;  Laterality: Bilateral;   UPPER GASTROINTESTINAL ENDOSCOPY       Inpatient Medications:   Allergies: No Known Allergies  Social History   Socioeconomic History   Marital status: Divorced    Spouse name: Not on file   Number of children: Not on file   Years of education: Not on file   Highest education level: Not on file  Occupational History  Not on file  Tobacco Use   Smoking status: Not on file   Smokeless tobacco: Not on file  Substance and Sexual Activity   Alcohol use: Not on file   Drug use: Not on file   Sexual activity: Not on file  Other Topics Concern   Not on file  Social History Narrative   Not on file   Social Determinants of Health   Financial Resource Strain: Not on file  Food Insecurity: Not on file  Transportation Needs: Not on file  Physical Activity: Not on file  Stress: Not on file  Social Connections: Not on file  Intimate Partner Violence: Not on file     No family history on file.   Review of Systems: ROS is limited as he is somewhat encephalopathic. He answers questions then falls back asleep  Physical Exam: Vitals:   10/08/2021 1425  BP: (!) 124/102  Pulse: 93  Resp: 15  Temp: 98.2 F (36.8 C)  TempSrc: Axillary  SpO2: 100%    GEN- The patient is acutely ill appearing on NRB. Wakes to questioning briefly, then falls back asleep.  HEENT: normocephalic, atraumatic; sclera clear, conjunctiva pink; hearing intact; oropharynx clear; neck supple Lungs- Diminished throughout.  Heart- Regular rate and rhythm, no murmurs, rubs or gallops GI- Obese, soft, non-tender, non-distended, bowel sounds present Extremities- no clubbing or cyanosis. Mild peripheral edema. Bilateral UE fistulas  MS- no significant deformity or atrophy Skin- warm and dry, no rash or lesion Psych- Flat affect.   Labs:  No results found for: WBC, HGB, HCT, MCV,  PLT No results for input(s): NA, K, CL, CO2, BUN, CREATININE, CALCIUM, PROT, BILITOT, ALKPHOS, ALT, AST, GLUCOSE in the last 168 hours.  Invalid input(s): LABALBU    Radiology/Studies: No results found.  EKG: appears to show AF with slow VR/ junctional rhythm in the 40s (personally reviewed)  TELEMETRY: On admission shows Bradycardia with periods of ventricular standstill as long as 11-12 seconds then to "NSR" with an atrial cycle length of 660 ms, with 3:1 conduction vs CHB.  Just after 1600 he had a run of WCT in the 120s.  (personally reviewed)  Assessment/Plan: Syncope/  Ventricular standstill / brief periods of CHB   HRs are currently in the 70-80s on my exam, long periods of ventricular standstill noted on admission with syncope. He has multiple underlying comorbidities including acute issues that are likely contributing to his periods of bradycardia.  He was started on amiodarone at some point. Earliest mention in chart is 07/2021, but cannot find when/where it was actually started.  Looking back has has periods of atrial flutter as far back as 2017 Not on AV nodal blockade, and has previously been intolerant due to hypotension.  On coumadin for CHA2DS2VASC of at least 5. No h/o stroke.   Also on coumadin for h/o LLE DVT (09/2015) With atypical appearance, LAE, and co-morbidities, he would not be an ablation candidate.  He is a very poor pacemaker candidate given his fistulas and very high risk of infection.   2. WCT He has history of atypical appearing atrial flutter    2. CAD s/p NSTEMI and stenting this admission He has history of intermittent chest pressure with hypotension.  He had DES 02/2021 Initial HS Trop ~ 4000. Follow.    3. ESRD on HD Nephrology Shriley Joffe need to follow.    4. Hypotension Remains hyoptensive despite normal HRs.  Art line being placed per ED staff We would recommend IM admit with his  multiple comorbidities.    5. Chronic systolic CHF EF 43-15% by  most recent Echo, 05/27/2021 at [facility] admit with his GI distress, need for O2, ESRD, and multiple significant co-morbidities.   Pt has multiple significant co-morbidities and is a not a transvenous pacemaker candidate. If his HRs continue to drop with resolution of his other acute issues, could eventually consider Leadless pacing system, but as previous conversation with patients her would prefer to avoid pacing if at all possible.    ADDENDUM  After my exam pt again had worsening of his conduction requiring transcutaneous pacing.  Plan for temp wire.  Pt is critically ill.  For questions or updates, please contact [place] Please consult [email or web address] for contact info under Cardiology/STEMI.  Signed, Shirley Friar, PA-C  10/08/2021 2:33 PM   I have seen and examined this patient with Oda Kilts.  Agree with above, note added to reflect my findings.  He has multiple medical problems including end-stage renal disease on dialysis, COPD, AI, diabetes, atrial flutter, diastolic heart failure, endocarditis in 2016 who presented to the hospital with syncope and bradycardia.  He was noted in the emergency room to have long pauses.  His oxygen was low, bicarb and potassium were both elevated.  On the evaluation, he is being percutaneously paced and is nonresponsive due to fentanyl given by the emergency room.  He is on a nonrebreather.    GEN: Obese, minimally responsive HEENT: normal  Neck: no JVD, carotid bruits, or masses Cardiac: Irregular; no murmurs, rubs, or gallops,no edema  Respiratory:  clear to auscultation bilaterally, normal work of breathing GI: soft, nontender, nondistended, + BS MS: no deformity or atrophy  Skin: warm and dry Neuro:  Strength and sensation are intact Psych: euthymic mood, full affect   1.  Syncope: Had ventricular standstill with periods of heart block.  The patient is currently being percutaneously paced and has  received fentanyl and thus has not received responding.  As he is being percutaneously paced, he would benefit from a temporary pacemaker wire.  It is unclear to me whether or not his need for dialysis Swetha Rayle reverse his heart block.  We Aune Adami plan for temporary wire implanted today.  2.  Coronary artery disease: Has had intermittent chest pressure with hypotension.  His initial troponin was 4000, but with his dialysis dependency, we Enyla Lisbon continue to monitor.  We Shaterrica Territo check serial troponins.  3.  End-stage renal disease: Dialysis per nephrology  4.  Wide-complex tachycardia: Unclear as to the cause.  This could be due to percutaneous pacing.  Would continue to monitor.  Elajah Kunsman M. Daniell Mancinas MD 09/24/2021 4:56 PM

## 2021-09-12 NOTE — Progress Notes (Signed)
eLink Physician-Brief Progress Note Patient Name: [name] [name] DOB: [birth year] MRN: [id number]   Date of Service  09/11/2021  HPI/Events of Note  Patient admitted with symptomatic bradycardia, NSTEMI, ESRD-DD, hyperkalemia, and acute on chronic respiratory failure aggravated by pulmonary edema. Urgent dialysis is planned for tonight.  eICU Interventions  New Patient Evaluation.        [name] [name] 09/16/2021, 8:04 PM

## 2021-09-12 NOTE — ED Notes (Signed)
Ccm at bedside.

## 2021-09-12 NOTE — ED Triage Notes (Signed)
Pt BIB [facility] syncopal episode at home N/V for 2 days. PT is a dialysis pt. EMS began pacing rate of 60 at 75.

## 2021-09-12 NOTE — Progress Notes (Addendum)
Brief nephrology note:  I was called by ER physician because of hyperkalemia.  Patient is ESRD on HD TTS at [place]. Well-known to me from outpatient HD clinic.  He has chronic hyperkalemia and supposed to be on [place] on nondialysis day.  He has gone through multiple surgeries for his AV fistula and recently we have been using left upper extremity AV fistula.  He presented with bradycardia, syncope and hypotension.  I went to see him to ER but patient was taken to Cath Lab for the placement of pacemaker.  I reviewed his chart and lab.  The potassium level is actually around his baseline.  I do not see urgent need for dialysis tonight.  Our team will see him tomorrow and arrange for dialysis.  If condition change overnight, please call back.  I discussed with ER physician.  OP HD orders:  Dialyzes at [facility], TTS, EDW: 130 Kg, 400/500, 2K, 2 ca, last HD on 12/1 for 3 hour only, LUE AVF Calcitriol 2.25 mcg  3x/week,  Iron 50 mg weekly Auryxia and renevela for binder Lokelma 8.4 g on non HD days  D.  Carolin Sicks, MD [facility].  Addendum 7:57 PM: Istat K 6.6. On levophed with acceptable BP. D/w ICU nurse and ICU team. Order Milly Jakob and order dialysis for tonight with low K diet ( if nursing/staffing allows). Pt has chronic hyperkalemia as outpatient. Discussed with dialysis nurse as well.   DCarolin Sicks.

## 2021-09-12 NOTE — ED Provider Notes (Signed)
[facility] EMERGENCY DEPARTMENT Provider Note   CSN: [id number] Arrival date & time: 09/21/2021  1419     History Chief Complaint  Patient presents with   Loss of Consciousness    [name] [name] is a 55 y.o. male.  55 yO M arrived here after multiple syncopal events.  Per EMS has had some nausea vomiting for couple days.  Patient tells me that he was having some right-sided chest discomfort yesterday.  He ended up having 2 syncopal events today.  EMS was called and found to have a heart rates in the 30s and 40s off and on.  Had some prolonged periods with no ventricular response that was paced in route.  The history is provided by the patient.  Loss of Consciousness Episode history:  Multiple Most recent episode:  Today Duration:  2 hours Timing:  Constant Progression:  Worsening Chronicity:  New Witnessed: no   Relieved by:  Nothing Worsened by:  Nothing Ineffective treatments:  None tried Associated symptoms: chest pain   Associated symptoms: no confusion, no fever, no headaches, no palpitations, no shortness of breath and no vomiting       Past Medical History:  Diagnosis Date   Renal disorder     There are no problems to display for this patient.   History reviewed. No pertinent surgical history.     No family history on file.     Home Medications Prior to Admission medications   Not on File    Allergies    Patient has no known allergies.  Review of Systems   Review of Systems  Constitutional:  Negative for chills and fever.  HENT:  Negative for congestion and facial swelling.   Eyes:  Negative for discharge and visual disturbance.  Respiratory:  Negative for shortness of breath.   Cardiovascular:  Positive for chest pain and syncope. Negative for palpitations.  Gastrointestinal:  Negative for abdominal pain, diarrhea and vomiting.  Musculoskeletal:  Negative for arthralgias and myalgias.  Skin:  Negative for color change and  rash.  Neurological:  Positive for syncope. Negative for tremors and headaches.  Psychiatric/Behavioral:  Negative for confusion and dysphoric mood.    Physical Exam Updated Vital Signs BP (!) 88/48   Pulse 87   Temp 98.2 F (36.8 C) (Axillary)   Resp 19   SpO2 100%   Physical Exam Vitals and nursing note reviewed.  Constitutional:      Appearance: He is well-developed.     Comments: Morbidly obese  HENT:     Head: Normocephalic and atraumatic.  Eyes:     Pupils: Pupils are equal, round, and reactive to light.  Neck:     Vascular: No JVD.  Cardiovascular:     Rate and Rhythm: Bradycardia present. Rhythm irregular.     Heart sounds: No murmur heard.   No friction rub. No gallop.  Pulmonary:     Effort: No respiratory distress.     Breath sounds: No wheezing.  Abdominal:     General: There is no distension.     Tenderness: There is no abdominal tenderness. There is no guarding or rebound.  Musculoskeletal:        General: Normal range of motion.     Cervical back: Normal range of motion and neck supple.     Comments: Left AV fistula with palpable thrill.  Skin:    Coloration: Skin is not pale.     Findings: No rash.  Neurological:  Mental Status: He is alert and oriented to person, place, and time.     Comments: Difficult to arouse but once awake is able to answer questions.   Psychiatric:        Behavior: Behavior normal.    ED Results / Procedures / Treatments   Labs (all labs ordered are listed, but only abnormal results are displayed) Labs Reviewed  CBC WITH DIFFERENTIAL/PLATELET - Abnormal; Notable for the following components:      Result Value   MCV 100.5 (*)    RDW 22.6 (*)    Neutro Abs 8.5 (*)    All other components within normal limits  COMPREHENSIVE METABOLIC PANEL - Abnormal; Notable for the following components:   Sodium 132 (*)    Potassium 6.0 (*)    Chloride 92 (*)    Glucose, Bld 425 (*)    BUN 35 (*)    Creatinine, Ser 8.10 (*)     Albumin 3.4 (*)    Total Bilirubin 1.6 (*)    GFR, Estimated 7 (*)    All other components within normal limits  PROTIME-INR - Abnormal; Notable for the following components:   Prothrombin Time 18.3 (*)    INR 1.5 (*)    All other components within normal limits  MAGNESIUM - Abnormal; Notable for the following components:   Magnesium 2.6 (*)    All other components within normal limits  I-STAT CREATININE, ED - Abnormal; Notable for the following components:   Creatinine, Ser 8.10 (*)    All other components within normal limits  CBG MONITORING, ED - Abnormal; Notable for the following components:   Glucose-Capillary 397 (*)    All other components within normal limits  I-STAT VENOUS BLOOD GAS, ED - Abnormal; Notable for the following components:   pO2, Ven 22.0 (*)    Bicarbonate 30.4 (*)    Acid-Base Excess 3.0 (*)    Sodium 132 (*)    Potassium 5.8 (*)    Calcium, Ion 1.12 (*)    All other components within normal limits  TROPONIN I (HIGH SENSITIVITY) - Abnormal; Notable for the following components:   Troponin I (High Sensitivity) 4,493 (*)    All other components within normal limits  RESP PANEL BY RT-PCR (FLU A&B, COVID) [id number]    EKG EKG Interpretation  Date/Time:  Friday September 12 2021 14:21:44 EST Ventricular Rate:  44 PR Interval:  46 QRS Duration: 169 QT Interval:  550 QTC Calculation: 471 R Axis:   -79 Text Interpretation: Wandering atrial pacemaker Right bundle branch block Abnrm T, probable ischemia, anterolateral lds high grade av block? Confirmed by Deno Etienne [phone]) on 10/06/2021 2:46:06 PM  Radiology DG Chest Port 1 View  Result Date: 09/20/2021 CLINICAL DATA:  Altered mental status EXAM: PORTABLE CHEST 1 VIEW COMPARISON:  Chest x-ray 02/16/2021 FINDINGS: Heart is enlarged. Mediastinum appears grossly stable. Pulmonary vascular prominence. No focal consolidation visualized. No significant pleural effusion. No pneumothorax. Somewhat limited due to body  habitus and portable technique. IMPRESSION: Cardiomegaly with pulmonary vascular congestion. Electronically Signed   By: Ofilia Neas M.D.   On: 10/08/2021 14:51    Procedures External pacer  Date/Time: 10/05/2021 3:46 PM Performed by: Deno Etienne, DO Authorized by: Deno Etienne, DO  Consent: The procedure was performed in an emergent situation. Verbal consent obtained. Risks and benefits: risks, benefits and alternatives were discussed Consent given by: patient Patient understanding: patient does not state understanding of the procedure being performed Patient consent: the patient's understanding of  the procedure does not match consent given Patient identity confirmed: verbally with patient Time out: Immediately prior to procedure a "time out" was called to verify the correct patient, procedure, equipment, support staff and site/side marked as required. Local anesthesia used: no  Anesthesia: Local anesthesia used: no  Sedation: Patient sedated: no  Patient tolerance: patient tolerated the procedure well with no immediate complications     Medications Ordered in ED Medications  norepinephrine (LEVOPHED) 4mg  in 281mL (0.016 mg/mL) premix infusion (has no administration in time range)    ED Course  I have reviewed the triage vital signs and the nursing notes.  Pertinent labs & imaging results that were available during my care of the patient were reviewed by me and considered in my medical decision making (see chart for details).    MDM Rules/Calculators/A&P                           55 yo M with a chief complaints of 2 syncopal events today.  Had some chest pain yesterday.  ?high grade av block.  Discussed with cards for eval.   Paced off an on here.  Periods of p waves without qrs.  Cards recommends medical admit.   Patient having some lower blood pressures.  His mental status is actually improved somewhat since not certain that those are accurate especially since he  has a failed right AV fistula and a active left AV fistula making it difficult to obtain a peripheral blood pressure cuff on his forearm or his leg.  I asked RT to place in art line.  Arterial line with pressures even lower than were found on the cuff.  Will start on pressors.  Critical care admit.  Signed out to Dr. Kathrynn Humble, please see his note for further details care in the ED.  CRITICAL CARE Performed by: Cecilio Asper   Total critical care time: 35 minutes  Critical care time was exclusive of separately billable procedures and treating other patients.  Critical care was necessary to treat or prevent imminent or life-threatening deterioration.  Critical care was time spent personally by me on the following activities: development of treatment plan with patient and/or surrogate as well as nursing, discussions with consultants, evaluation of patient's response to treatment, examination of patient, obtaining history from patient or surrogate, ordering and performing treatments and interventions, ordering and review of laboratory studies, ordering and review of radiographic studies, pulse oximetry and re-evaluation of patient's condition.  The patients results and plan were reviewed and discussed.   Any x-rays performed were independently reviewed by myself.   Differential diagnosis were considered with the presenting HPI.  Medications  norepinephrine (LEVOPHED) 4mg  in 271mL (0.016 mg/mL) premix infusion (has no administration in time range)    Vitals:   09/30/2021 1445 09/21/2021 1449 09/14/2021 1500 09/20/2021 1515  BP: (!) 79/40 (!) 98/56 (!) 74/57 (!) 88/48  Pulse:  (!) 35  87  Resp: 19 17 (!) 21 19  Temp:      TempSrc:      SpO2:  100%  100%    Final diagnoses:  Syncope and collapse  Symptomatic bradycardia    Admission/ observation were discussed with the admitting physician, patient and/or family and they are comfortable with the plan.   Final Clinical Impression(s) /  ED Diagnoses Final diagnoses:  Syncope and collapse  Symptomatic bradycardia    Rx / DC Orders ED Discharge Orders     None  Deno Etienne, DO 10/08/2021 1552

## 2021-09-13 ENCOUNTER — Inpatient Hospital Stay (HOSPITAL_COMMUNITY): Payer: Medicare HMO

## 2021-09-13 DIAGNOSIS — I442 Atrioventricular block, complete: Secondary | ICD-10-CM | POA: Diagnosis not present

## 2021-09-13 DIAGNOSIS — R001 Bradycardia, unspecified: Secondary | ICD-10-CM | POA: Diagnosis not present

## 2021-09-13 DIAGNOSIS — I214 Non-ST elevation (NSTEMI) myocardial infarction: Secondary | ICD-10-CM | POA: Diagnosis not present

## 2021-09-13 LAB — GLUCOSE, CAPILLARY
Glucose-Capillary: 329 mg/dL — ABNORMAL HIGH (ref 70–99)
Glucose-Capillary: 377 mg/dL — ABNORMAL HIGH (ref 70–99)
Glucose-Capillary: 278 mg/dL — ABNORMAL HIGH (ref 70–99)
Glucose-Capillary: 185 mg/dL — ABNORMAL HIGH (ref 70–99)
Glucose-Capillary: 164 mg/dL — ABNORMAL HIGH (ref 70–99)
Glucose-Capillary: 137 mg/dL — ABNORMAL HIGH (ref 70–99)
Glucose-Capillary: 136 mg/dL — ABNORMAL HIGH (ref 70–99)
Glucose-Capillary: 95 mg/dL (ref 70–99)
Glucose-Capillary: 79 mg/dL (ref 70–99)
Glucose-Capillary: 109 mg/dL — ABNORMAL HIGH (ref 70–99)
Glucose-Capillary: 183 mg/dL — ABNORMAL HIGH (ref 70–99)
Glucose-Capillary: 219 mg/dL — ABNORMAL HIGH (ref 70–99)

## 2021-09-13 LAB — BASIC METABOLIC PANEL
Anion gap: 11 (ref 5–15)
BUN: 43 mg/dL — ABNORMAL HIGH (ref 6–20)
CO2: 28 mmol/L (ref 22–32)
Calcium: 8.6 mg/dL — ABNORMAL LOW (ref 8.9–10.3)
Chloride: 95 mmol/L — ABNORMAL LOW (ref 98–111)
Creatinine, Ser: 8.69 mg/dL — ABNORMAL HIGH (ref 0.61–1.24)
GFR, Estimated: 7 mL/min — ABNORMAL LOW (ref >60)
Glucose, Bld: 305 mg/dL — ABNORMAL HIGH (ref 70–99)
Potassium: 5.5 mmol/L — ABNORMAL HIGH (ref 3.5–5.1)
Sodium: 134 mmol/L — ABNORMAL LOW (ref 135–145)

## 2021-09-13 LAB — CBC
HCT: 38 % — ABNORMAL LOW (ref 39.0–52.0)
HCT: 35.6 % — ABNORMAL LOW (ref 39.0–52.0)
Hemoglobin: 11.6 g/dL — ABNORMAL LOW (ref 13.0–17.0)
Hemoglobin: 10.5 g/dL — ABNORMAL LOW (ref 13.0–17.0)
MCH: 30.1 pg (ref 26.0–34.0)
MCH: 29.7 pg (ref 26.0–34.0)
MCHC: 30.5 g/dL (ref 30.0–36.0)
MCHC: 29.5 g/dL — ABNORMAL LOW (ref 30.0–36.0)
MCV: 98.7 fL (ref 80.0–100.0)
MCV: 100.8 fL — ABNORMAL HIGH (ref 80.0–100.0)
Platelets: 301 10*3/uL (ref 150–400)
Platelets: 287 10*3/uL (ref 150–400)
RBC: 3.85 MIL/uL — ABNORMAL LOW (ref 4.22–5.81)
RBC: 3.53 MIL/uL — ABNORMAL LOW (ref 4.22–5.81)
RDW: 22.4 % — ABNORMAL HIGH (ref 11.5–15.5)
RDW: 22.4 % — ABNORMAL HIGH (ref 11.5–15.5)
WBC: 11.9 10*3/uL — ABNORMAL HIGH (ref 4.0–10.5)
WBC: 9.8 10*3/uL (ref 4.0–10.5)
nRBC: 0 % (ref 0.0–0.2)
nRBC: 0 % (ref 0.0–0.2)

## 2021-09-13 LAB — HIV ANTIBODY (ROUTINE TESTING W REFLEX): HIV Screen 4th Generation wRfx: REACTIVE — AB

## 2021-09-13 LAB — HEPARIN LEVEL (UNFRACTIONATED)
Heparin Unfractionated: <0.1 IU/mL — ABNORMAL LOW (ref 0.30–0.70)
Heparin Unfractionated: <0.1 IU/mL — ABNORMAL LOW (ref 0.30–0.70)

## 2021-09-13 LAB — PHOSPHORUS: Phosphorus: 7.1 mg/dL — ABNORMAL HIGH (ref 2.5–4.6)

## 2021-09-13 LAB — MAGNESIUM: Magnesium: 2.6 mg/dL — ABNORMAL HIGH (ref 1.7–2.4)

## 2021-09-13 LAB — HEPATITIS B SURFACE ANTIGEN: Hepatitis B Surface Ag: NONREACTIVE

## 2021-09-13 LAB — PROTIME-INR
INR: 1.9 — ABNORMAL HIGH (ref 0.8–1.2)
Prothrombin Time: 21.3 seconds — ABNORMAL HIGH (ref 11.4–15.2)

## 2021-09-13 LAB — HEPATITIS B SURFACE ANTIBODY,QUALITATIVE: Hep B S Ab: REACTIVE — AB

## 2021-09-13 MED ORDER — DEXTROSE 50 % IV SOLN: 0.0000 mL | INTRAVENOUS | Status: DC | PRN

## 2021-09-13 MED ORDER — DEXTROSE 50 % IV SOLN
INTRAVENOUS | Status: AC
  Administered 2021-09-13: 25 mL
  Filled 2021-09-13: qty 50

## 2021-09-13 MED ORDER — DIPHENHYDRAMINE HCL 50 MG/ML IJ SOLN
12.5000 mg | Freq: Once | INTRAMUSCULAR | Status: AC
  Administered 2021-09-13: 12.5 mg via INTRAVENOUS
  Filled 2021-09-13: qty 1

## 2021-09-13 MED ORDER — SODIUM CHLORIDE 0.9 % IV SOLN: 100.0000 mL | INTRAVENOUS | Status: DC | PRN

## 2021-09-13 MED ORDER — ALTEPLASE 2 MG IJ SOLR: 2.0000 mg | Freq: Once | INTRAMUSCULAR | Status: DC | PRN

## 2021-09-13 MED ORDER — ALPRAZOLAM 0.25 MG PO TABS
0.2500 mg | ORAL_TABLET | Freq: Once | ORAL | Status: AC
  Administered 2021-09-13: 0.25 mg via ORAL
  Filled 2021-09-13: qty 1

## 2021-09-13 MED ORDER — LIDOCAINE-PRILOCAINE 2.5-2.5 % EX CREA: 1.0000 "application " | TOPICAL_CREAM | CUTANEOUS | Status: DC | PRN

## 2021-09-13 MED ORDER — "THROMBI-PAD 3""X3"" EX PADS"
1.0000 | MEDICATED_PAD | Freq: Once | CUTANEOUS | Status: AC
  Administered 2021-09-13: 1 via TOPICAL
  Filled 2021-09-13: qty 1

## 2021-09-13 MED ORDER — PENTAFLUOROPROP-TETRAFLUOROETH EX AERO: 1.0000 "application " | INHALATION_SPRAY | CUTANEOUS | Status: DC | PRN

## 2021-09-13 MED ORDER — MIDODRINE HCL 5 MG PO TABS
10.0000 mg | ORAL_TABLET | Freq: Three times a day (TID) | ORAL | Status: DC
  Administered 2021-09-13 – 2021-09-15 (×5): 10 mg via ORAL
  Filled 2021-09-13 (×5): qty 2

## 2021-09-13 MED ORDER — CLONAZEPAM 0.5 MG PO TABS
0.5000 mg | ORAL_TABLET | Freq: Two times a day (BID) | ORAL | Status: DC
  Administered 2021-09-13 – 2021-09-14 (×3): 0.5 mg via ORAL
  Filled 2021-09-13 (×3): qty 1

## 2021-09-13 MED ORDER — INSULIN GLARGINE-YFGN 100 UNIT/ML ~~LOC~~ SOLN
50.0000 [IU] | Freq: Every day | SUBCUTANEOUS | Status: DC
  Administered 2021-09-13: 50 [IU] via SUBCUTANEOUS
  Filled 2021-09-13 (×3): qty 0.5

## 2021-09-13 MED ORDER — DIPHENHYDRAMINE HCL 25 MG PO CAPS
25.0000 mg | ORAL_CAPSULE | Freq: Once | ORAL | Status: AC
  Administered 2021-09-13: 25 mg via ORAL
  Filled 2021-09-13: qty 1

## 2021-09-13 MED ORDER — INSULIN ASPART 100 UNIT/ML IJ SOLN
0.0000 [IU] | Freq: Three times a day (TID) | INTRAMUSCULAR | Status: DC
  Administered 2021-09-14: 4 [IU] via SUBCUTANEOUS
  Administered 2021-09-14 (×2): 7 [IU] via SUBCUTANEOUS
  Administered 2021-09-15 – 2021-09-16 (×2): 3 [IU] via SUBCUTANEOUS
  Administered 2021-09-16: 4 [IU] via SUBCUTANEOUS
  Administered 2021-09-16: 7 [IU] via SUBCUTANEOUS

## 2021-09-13 MED ORDER — LIDOCAINE HCL (PF) 1 % IJ SOLN
INTRAMUSCULAR | Status: AC
  Administered 2021-09-13: 5 mL
  Filled 2021-09-13: qty 5

## 2021-09-13 MED ORDER — INSULIN DETEMIR 100 UNIT/ML ~~LOC~~ SOLN
8.0000 [IU] | Freq: Two times a day (BID) | SUBCUTANEOUS | Status: DC
  Filled 2021-09-13 (×2): qty 0.08

## 2021-09-13 MED ORDER — LIDOCAINE HCL (PF) 1 % IJ SOLN: 5.0000 mL | INTRAMUSCULAR | Status: DC | PRN

## 2021-09-13 MED ORDER — IOHEXOL 350 MG/ML SOLN
80.0000 mL | Freq: Once | INTRAVENOUS | Status: AC | PRN
  Administered 2021-09-13: 80 mL via INTRAVENOUS

## 2021-09-13 MED ORDER — HEPARIN SODIUM (PORCINE) 1000 UNIT/ML DIALYSIS
1000.0000 [IU] | INTRAMUSCULAR | Status: DC | PRN
  Administered 2021-09-13: 1000 [IU] via INTRAVENOUS_CENTRAL
  Filled 2021-09-13 (×2): qty 1

## 2021-09-13 MED ORDER — INSULIN REGULAR(HUMAN) IN NACL 100-0.9 UT/100ML-% IV SOLN
INTRAVENOUS | Status: DC
  Administered 2021-09-13: 4.4 [IU]/h via INTRAVENOUS
  Filled 2021-09-13: qty 100

## 2021-09-13 MED ORDER — INSULIN ASPART 100 UNIT/ML IJ SOLN
1.0000 [IU] | INTRAMUSCULAR | Status: DC
Start: 2021-09-13 — End: 2021-09-13

## 2021-09-13 MED ORDER — SODIUM CHLORIDE 0.9 % IV SOLN
100.0000 mL | INTRAVENOUS | Status: DC | PRN
  Administered 2021-09-13: 100 mL via INTRAVENOUS

## 2021-09-13 MED ORDER — INSULIN ASPART 100 UNIT/ML IJ SOLN
4.0000 [IU] | Freq: Three times a day (TID) | INTRAMUSCULAR | Status: DC
  Administered 2021-09-13 – 2021-09-15 (×5): 4 [IU] via SUBCUTANEOUS

## 2021-09-13 NOTE — Progress Notes (Signed)
NAME:  [name] [name], MRN:  [id number], DOB:  Sep 01, 1966, LOS: 1 ADMISSION DATE:  09/24/2021, CONSULTATION DATE:  09/20/2021 REFERRING MD:  Kathrynn Humble, CHIEF COMPLAINT:  syncope   History of Present Illness:  6yM with ESRD on HD via AVF, advanced CHF, pulmonary hypertension, DVT on warfarin who had had nausea and vomiting for few days, R sided CP yesterday and then syncope today. He was BIBEMS who found him to have HR in 30s to 40s, transcutaneous pacing started en route.   In ED arterial line placed, trop 4k, K found to be 6.6 on istat, 1g Ca gluconate, 2 amps bicarb given. Levo drip titrated down to 10 mcg/min. Remained dependent on transcutaneous pacing however, EP planning to place temporary transvenous pacemaker.   Pertinent  Medical History  ESRD DVT Chronic systolic and diastolic heart failure Multifactorial pulmonary hypertension CAD OSA not using CPAP  Significant Hospital Events: Including procedures, antibiotic start and stop dates in addition to other pertinent events   10/06/2021 arterial line placed, transcutaneous pacing, transvenous pacemaker placed in lab 10/03/2021 Started on IHD  Interim History / Subjective:   Remained pacer dependent overnight. Receiving second HD session this morning. Denies dypsnea. Uncomfortable with straight leg for pacemaker.   Objective   Blood pressure (!) 144/67, pulse 84, temperature 98.4 F (36.9 C), temperature source Oral, resp. rate 12, height 5\' 10"  (1.778 m), weight 127.5 kg, SpO2 100 %.        Intake/Output Summary (Last 24 hours) at 09/13/2021 0731 Last data filed at 09/13/2021 0600 Gross per 24 hour  Intake 1037.18 ml  Output --  Net 1037.18 ml   Filed Weights   09/21/2021 1618 09/13/21 0500  Weight: 132 kg 127.5 kg    Examination: General appearance: 55 y.o., male, drowsy but arousable and answers appropriately, morbid obesity.  Eyes: PERRL, tracking appropriately HENT: NCAT; dry MM Lungs: clear bilaterally  CV: VVI pacing  at 80 with underlying rhythm now in 60's Abdomen: Soft, non-tender; non-distended, BS present  Extremities: trace peripheral edema, cool, multiple excoriations legs.  Neuro: grossly nonfocal  Ancillary tests personally reviewed:  Improving hyperkalemia to 5.5 Remains hyperglycemic Creatinine 8.6 Assessment & Plan:   Critically ill due to cardiogenic shock due to complete heart block with slow escape requiring TVPM and titration of NE NSTEMI  Acute on chronic hypoxic respiratory failure.  Toxic metabolic encephalopathy due to under-dialysis and hypotension.  Chronic systolic and diastolic heart failure due to ICM:  CAD Persistent atrial fibrillation  ESRD stage V with hyperkalemia Chronic hypotension  DVT DM type 2  - uncontrolled   Plan :  - For IHD today. Follow K, support BP with NE to keep MAP >65. Predominantly needs dialysis rather than ultrafiltration as doesn't appear very overloaded.  - Increased HR 90 to maintain CO during HD, then will try reducing rate to backup - Continue heparin for ACS - Cardiology following.  - Refused CPAP for OSA.  - Resume clonazepam for anxiety.  - Continue secondary prevention with brillinta and atorvastatin.  - Start insulin infusion.     Best Practice (right click and "Reselect all SmartList Selections" daily)   Diet/type: NPO w/ oral meds DVT prophylaxis: systemic heparin GI prophylaxis: PPI Lines: Central line Foley:  N/A Code Status:  full code Last date of multidisciplinary goals of care discussion [will attempt to reach out to family today]  Labs   CBC: Recent Labs  Lab 09/25/2021 1425 09/13/2021 1436 09/20/2021 1608 09/13/21 0632  WBC 10.3  --   --  11.9*  NEUTROABS 8.5*  --   --   --   HGB 13.1 15.0 12.6* 11.6*  HCT 43.2 44.0 37.0* 38.0*  MCV 100.5*  --   --  98.7  PLT 266  --   --  301     Basic Metabolic Panel: Recent Labs  Lab 09/30/2021 1425 09/30/2021 1430 10/07/2021 1432 10/08/2021 1436 10/02/2021 1608  09/13/2021 2020 09/29/2021 2050  NA 132*  --   --  132* 126* 130* 131*  K 6.0*  --   --  5.8* 6.6* 6.3* 6.5*  CL 92*  --   --   --   --  91* 90*  CO2 25  --   --   --   --  24 26  GLUCOSE 425*  --   --   --   --  485* 472*  BUN 35*  --   --   --   --  42* 41*  CREATININE 8.10*  --  8.10*  --   --  8.54* 8.48*  CALCIUM 9.6  --   --   --   --  8.9 9.2  MG  --  2.6*  --   --   --   --   --     GFR: Estimated Creatinine Clearance: 12.9 mL/min (A) (by C-G formula based on SCr of 8.48 mg/dL (H)). Recent Labs  Lab 10/08/2021 1425 10/04/2021 2050 09/13/21 0632  PROCALCITON  --  1.23  --   WBC 10.3  --  11.9*  LATICACIDVEN  --  2.1*  --      Liver Function Tests: Recent Labs  Lab 10/02/2021 1425  AST 37  ALT 15  ALKPHOS 62  BILITOT 1.6*  PROT 7.6  ALBUMIN 3.4*    No results for input(s): LIPASE, AMYLASE in the last 168 hours. No results for input(s): AMMONIA in the last 168 hours.  ABG    Component Value Date/Time   PHART 7.409 09/16/2021 1608   PCO2ART 45.4 10/01/2021 1608   PO2ART 328 (H) 09/11/2021 1608   HCO3 28.7 (H) 09/28/2021 1608   TCO2 30 10/04/2021 1608   O2SAT 100.0 10/02/2021 1608      Coagulation Profile: Recent Labs  Lab 09/13/2021 1425 09/13/21 0600  INR 1.5* 1.9*     Cardiac Enzymes: No results for input(s): CKTOTAL, CKMB, CKMBINDEX, TROPONINI in the last 168 hours.  HbA1C: No results found for: HGBA1C  CBG: Recent Labs  Lab 09/13/2021 1946 10/05/2021 2154 09/29/2021 2339 09/13/21 0246 09/13/21 0322  GLUCAP 451* 397* 420* 377* 329*    CRITICAL CARE Performed by: Kipp Brood   Total critical care time: 40 minutes  Critical care time was exclusive of separately billable procedures and treating other patients.  Critical care was necessary to treat or prevent imminent or life-threatening deterioration.  Critical care was time spent personally by me on the following activities: development of treatment plan with patient and/or surrogate as  well as nursing, discussions with consultants, evaluation of patient's response to treatment, examination of patient, obtaining history from patient or surrogate, ordering and performing treatments and interventions, ordering and review of laboratory studies, ordering and review of radiographic studies, pulse oximetry, re-evaluation of patient's condition and participation in multidisciplinary rounds.  Kipp Brood, MD [facility] ICU Physician [place]  Pager: [phone] Mobile: [phone] After hours: [phone].

## 2021-09-13 NOTE — Procedures (Addendum)
Central Venous Catheter Insertion Procedure Note  [name] [name]  [id number]  12-29-1965  Date:09/13/21  Time:5:45 AM   Provider Performing:John D Rollene Rotunda   Procedure: Attempted Insertion of Non-tunneled Central Venous Catheter(36556) with US guidance [id number]) and unable to place  Indication(s) Hemodialysis  Consent Risks of the procedure as well as the alternatives and risks of each were explained to the patient and/or caregiver.  Consent for the procedure was obtained and is signed in the bedside chart  Anesthesia Topical only with 1% lidocaine   Timeout Verified patient identification, verified procedure, site/side was marked, verified correct patient position, special equipment/implants available, medications/allergies/relevant history reviewed, required imaging and test results available.  Sterile Technique Maximal sterile technique including full sterile barrier drape, hand hygiene, sterile gown, sterile gloves, mask, hair covering, sterile ultrasound probe cover (if used).  Procedure Description Area of catheter insertion was cleaned with chlorhexidine and draped in sterile fashion.  With real-time ultrasound guidance a HD catheter was attempted to be placed into the right internal jugular vein but was unsuccessful.   Complications/Tolerance Unable to advance guide wire and was unable to place HD cath Chest X-ray is ordered to no pneumothorax Minimal  Specimen(s) None  JD [name] [facility] 09/13/2021, 5:48 AM  Please see [email or web address] for pager details.  From 7A-7P if no response, please call [phone]. After hours, please call ELink [phone].  Addendum to procedure done by PA JD Rollene Rotunda.  Wire placed into vein easily, unable to advance passed 15 cm.  Resistance when dilation attempted.  Line placed with resistance but no blood returned.  Removed line and held pressure.

## 2021-09-13 NOTE — Procedures (Signed)
Central Venous Catheter Insertion Procedure Note  [name] [name]  [id number]  08-10-1966  Date:09/13/21  Time:5:41 AM   Provider Performing:Tahisha Hakim Duwayne Heck   Procedure: Insertion of Non-tunneled Central Venous Catheter(36556)with US guidance ([id number])    Indication(s) Hemodialysis  Consent Risks of the procedure as well as the alternatives and risks of each were explained to the patient and/or caregiver.  Consent for the procedure was obtained and is signed in the bedside chart  Anesthesia Topical only with 1% lidocaine   Timeout Verified patient identification, verified procedure, site/side was marked, verified correct patient position, special equipment/implants available, medications/allergies/relevant history reviewed, required imaging and test results available.  Sterile Technique Maximal sterile technique including full sterile barrier drape, hand hygiene, sterile gown, sterile gloves, mask, hair covering, sterile ultrasound probe cover (if used).  Procedure Description Area of catheter insertion was cleaned with chlorhexidine and draped in sterile fashion.   With real-time ultrasound guidance a HD catheter was placed into the right femoral vein.  Nonpulsatile blood flow and easy flushing noted in all ports.  The catheter was sutured in place and sterile dressing applied.  Patient unable to lay flat and still at this point, so had to place in femoral.    Complications/Tolerance None; patient tolerated the procedure well. Chest X-ray is ordered to verify placement for internal jugular or subclavian cannulation.  Chest x-ray is not ordered for femoral cannulation.  EBL Minimal  Specimen(s) None

## 2021-09-13 NOTE — Progress Notes (Signed)
Paged by RN that TVP was not capturing and had increased thresholds (10->15 mA). Interrogated device  Initial settings VVI 80 Sens 2.0 mV Output 15 mA Telemetry double counting QRS/T wave Changed leads, adequate capture Set to VVI 90 and threshold 0.6 mA, lost at 0.4 mA  Final device settings VVI 80 Sens 2.0 mV Output 5 mA  Patient somewhat frustrated with TVP and having to stay supine. Explained why he cannot sit straight up/get out of bed/lay on one side.  He is device dependent on the TVP currently with no escape >35

## 2021-09-13 NOTE — Procedures (Signed)
   I was present at this dialysis session, have reviewed the session itself and made  appropriate changes Kelly Splinter MD [place] pager [phone]   09/13/2021, 10:20 AM

## 2021-09-13 NOTE — Progress Notes (Signed)
[facility] Progress Note Patient Name: [name] [name] DOB: [birth year] MRN: [id number]   Date of Service  09/13/2021  HPI/Events of Note  RUE PIV-  site still bleeding after 5 hrs, PIV is out. thrombi-pad applied without hemostasis. RN requesting CBC and possible stitch by floor team. On heparin for P.E. anti Xa < 0.10  Going on oozing for 5 hrs.   eICU Interventions  Get CBC. Get another anti Xa. Informed CCM team for above. Benadryl for hand icthing. Orally.     Intervention Category Intermediate Interventions: Other:  Elmer Sow 09/13/2021, 10:52 PM

## 2021-09-13 NOTE — Progress Notes (Signed)
S: 55 yo M ESRD w/ hyperkalemia receiving HD and has pacemaker for HR in 30-40s. PCCM called to assess bleeding site on RUE where a PIV came out. Patient is on heparin drip. RN states the site has been bleeding for several hours. She applied pressure and thrombi pad without success and site still bleeding.  O: Vitals:   09/13/21 2000 09/13/21 2030  BP: 123/67 93/82  Pulse: 81 81  Resp: (!) 22 20  Temp:    SpO2: 100% 100%   General:  NAD; ill appearing on HFNC HEENT: MM pink/moist; HFNC in place Neuro: Aox3; MAE; mildly confused CV: s1s2, paced rhythm, no m/r/g PULM:  dim clear BS bilaterally; HFNC in place w/ sats 95% GI: soft, bsx4 active  Extremities: warm/dry, trace BLE and BUE edema; RUE bleeding from old PIV site; good radial pulse in R arm and mildly cool to touch  AP: PIV site bleeding: P: -will hold heparin x1 hour -reapply thrombi pad and hold firm pressure -redress site with dressing -monitor for signs of re-bleeding and any Right upper extremity abnormalities -follow H/H; transfuse for hemoglobin <7  JD [name] [facility] 09/13/2021, 11:50 PM  Please see [email or web address] for pager details.  From 7A-7P if no response, please call [phone]. After hours, please call ELink [phone].

## 2021-09-13 NOTE — Progress Notes (Signed)
Progress Note   Subjective   Receiving HD.  Very confused.  Moves frequently in bed.  Inpatient Medications    Scheduled Meds:  aspirin  81 mg Oral Daily   atorvastatin  80 mg Oral Daily   Chlorhexidine Gluconate Cloth  6 each Topical Q0600   clonazePAM  0.5 mg Oral BID   insulin glargine-yfgn  20 Units Subcutaneous Daily   ondansetron (ZOFRAN) IV  4 mg Intravenous Once   pantoprazole (PROTONIX) IV  40 mg Intravenous QHS   ticagrelor  90 mg Oral BID   Continuous Infusions:  sodium chloride 100 mL (09/13/21 0851)   sodium chloride     heparin 1,350 Units/hr (09/13/21 0645)   insulin 4.4 Units/hr (09/13/21 0905)   norepinephrine (LEVOPHED) Adult infusion 9 mcg/min (09/13/21 0111)   PRN Meds: sodium chloride, sodium chloride, acetaminophen (TYLENOL) oral liquid 160 mg/5 mL, alteplase, dextrose, docusate sodium, heparin, lidocaine (PF), lidocaine-prilocaine, ondansetron, pentafluoroprop-tetrafluoroeth, polyethylene glycol, sodium chloride flush   Vital Signs    Vitals:   09/13/21 0920 09/13/21 0930 09/13/21 0945 09/13/21 1000  BP: 99/61 (!) 86/64 (!) 110/59 116/67  Pulse: 90 90 89 90  Resp: (!) 23 (!) 22 (!) 23 (!) 24  Temp:      TempSrc:      SpO2:  100%    Weight:      Height:        Intake/Output Summary (Last 24 hours) at 09/13/2021 1001 Last data filed at 09/13/2021 0600 Gross per 24 hour  Intake 1037.18 ml  Output --  Net 1037.18 ml   Filed Weights   10/07/2021 1618 09/13/21 0500 09/13/21 0715  Weight: 132 kg 127.5 kg 126.8 kg    Telemetry    V paced - Personally Reviewed  Physical Exam   GEN- The patient is ill appearing, wakes but is very confused.  Moves frequently in bed Head- normocephalic, atraumatic Eyes-  Sclera clear, conjunctiva pink Ears- hearing intact Oropharynx- clear Neck- supple, Lungs-  normal work of breathing Heart- Regular rate and rhythm  (paced) GI- soft  Extremities- no clubbing, cyanosis, + dependant edema    Labs     Chemistry Recent Labs  Lab 10/01/2021 1425 09/17/2021 1432 09/11/2021 2020 10/03/2021 2050 09/13/21 0632  NA 132*   < > 130* 131* 134*  K 6.0*   < > 6.3* 6.5* 5.5*  CL 92*  --  91* 90* 95*  CO2 25  --  24 26 28   GLUCOSE 425*  --  485* 472* 305*  BUN 35*  --  42* 41* 43*  CREATININE 8.10*   < > 8.54* 8.48* 8.69*  CALCIUM 9.6  --  8.9 9.2 8.6*  PROT 7.6  --   --   --   --   ALBUMIN 3.4*  --   --   --   --   AST 37  --   --   --   --   ALT 15  --   --   --   --   ALKPHOS 62  --   --   --   --   BILITOT 1.6*  --   --   --   --   GFRNONAA 7*  --  7* 7* 7*  ANIONGAP 15  --  15 15 11    < > = values in this interval not displayed.     Hematology Recent Labs  Lab 09/14/2021 1425 09/14/2021 1436 09/25/2021 1608 09/13/21 0632  WBC 10.3  --   --  11.9*  RBC 4.30  --   --  3.85*  HGB 13.1 15.0 12.6* 11.6*  HCT 43.2 44.0 37.0* 38.0*  MCV 100.5*  --   --  98.7  MCH 30.5  --   --  30.1  MCHC 30.3  --   --  30.5  RDW 22.6*  --   --  22.4*  PLT 266  --   --  301     Patient ID  [name] [name] is a 55 y.o. male with a history of ESRD on HD, COPD, AI, RBBB, OSA, DMII, persistent atrial flutter, chronic OAC on coumadin, chronic DVT, chronic diastolic CHF, h/o endocarditis in 2016 who is being seen today for the evaluation of syncope and bradycardia at the request of Dr. Tyrone Nine.  Assessment & Plan    1.  Complete heart block Occurs in the setting of hyperkalemia Will continue to follow over the weekend. I worry that he is a very poor candidate for PPM given prior endocarditis and very poor venous access.  May have to consider leadless pacemaker.  Temp pacer threshold is currently <61mA.  Continue temp transvenous pacing  2. NSTEMI Likely due to acute medical illness Not currently a candidate for cath On IV heparin  3. Prior DVT Remains on heparin as per primary team  4. H/o afib Noted  5. ESRD/ hyperkalemia Currently receiving HD Dialysis access appears to be a challenge Hopefully  AMS will improve with dialysis  6. Chronic systolic dysfunction Managing volume with HD Hypotension limits therapy  7. Code status His prognosis is very very poor.  I worry that if he loses capture with his temp wire that our options are very limited.  He is not a candidate for permanent pacemaker currently due to his illness/ overall condition. Would strongly consider goals of care conversations with his family.   I have spoken with his nurse at length in this regard.  She has spoken with a family member residing in [place] and will attempt to identify next of kin.  Critical care time was exclusive of separate billable procedures and treating other patients.  Critial care time was spent personally by me on the following activities: development of treatment plan with nursing, evaluation of patients response to treatment, examining patient,   ordering/ reviewing treatments/ interventions, lab studies, radiographic studies, pulse ox, and re-evaluation of patients condition.     The patient is critically ill with multiple organ systems failure and requires high complexity decision making for assessment and support, frequent evaluation and titration of therapies, application of advanced monitoring technologies and extensive interpretation of databases.   Critical care was necessary to treat or prevent immintent or life-threatening deterioration.    Total CCT spent directly with the patient today is 45 minutes  Thompson Grayer MD, [facility] 09/13/2021 10:10 AM

## 2021-09-13 NOTE — Progress Notes (Signed)
ANTICOAGULATION CONSULT NOTE   Pharmacy Consult:  Heparin Indication: atrial fibrillation  Allergies  Allergen Reactions   Ace Inhibitors Cough   Pork-Derived Products Other (See Comments)    Entry determined to be clinically insignificant: OK TO GIVE HEPARIN (has tolerated well in the past)  Patient had allergic episode in 1989 after he ate pork at a fair. He developed fever and rash and went to the ED, but he did not have breathing issues. Received heparin in the past. Patient ok with removing pork allergy and listing as intolerance instead.     Patient Measurements: Height: 5\' 10"  (177.8 cm) Weight: 125.5 kg (276 lb 10.8 oz) IBW/kg (Calculated) : 73 Heparin Dosing Weight: 104 kg  Vital Signs: Temp: 98.4 F (36.9 C) (12/03 1608) Temp Source: Oral (12/03 1608) BP: 76/49 (12/03 1549) Pulse Rate: 89 (12/03 1502)  Labs: Recent Labs    09/27/2021 1425 09/26/2021 1432 10/02/2021 1436 10/02/2021 1608 10/06/2021 2020 09/16/2021 2050 09/13/21 0600 09/13/21 0632 09/13/21 1531  HGB 13.1  --  15.0 12.6*  --   --   --  11.6*  --   HCT 43.2  --  44.0 37.0*  --   --   --  38.0*  --   PLT 266  --   --   --   --   --   --  301  --   LABPROT 18.3*  --   --   --   --   --  21.3*  --   --   INR 1.5*  --   --   --   --   --  1.9*  --   --   HEPARINUNFRC  --   --   --   --   --   --  <0.10*  --  <0.10*  CREATININE 8.10*   < >  --   --  8.54* 8.48*  --  8.69*  --   TROPONINIHS 4,493*  --   --   --   --  5,596*  --   --   --    < > = values in this interval not displayed.     Estimated Creatinine Clearance: 12.5 mL/min (A) (by C-G formula based on SCr of 8.69 mg/dL (H)).   Assessment: 31 YOM presented with nausea and vomiting for 2 days and syncope.  Patient was hypotensive and bradycardic, requiring transcutaneous pacing.  S/p cath with temporary transvenous pacemaker insertion.  Pharmacy consulted to start IV heparin while Coumadin is on hold.  INR is sub-therapeutic on admit.  Heparin  level this afternoon is undetectable.  No overt bleeding or complications noted.  Goal of Therapy:  Heparin level 0.3-0.7 units/ml Monitor platelets by anticoagulation protocol: Yes   Plan:  Increase IV heparin to 1550 units/hr. Recheck heparin level in 8 hrs. Daily heparin level and CBC.  [place] Crane, Roylene Reason, BCCP Clinical Pharmacist  09/13/2021 4:36 PM   [facility] pharmacy phone numbers are listed on [email or web address]

## 2021-09-13 NOTE — Progress Notes (Signed)
[place] Progress Note Patient Name: [name] [name] DOB: 1965/11/07 MRN: [id number]   Date of Service  09/13/2021  HPI/Events of Note  Patient is anxious, he takes Xanax at home for anxiety, he is also asking for Benadryl which he takes just before starting dialysis to prevent itching during dialysis. Finally,  patient has hyperglycemia.  eICU Interventions  Xanax 0.25 mg po x 1 ordered, Benadryl 12.5 mg iv x 1 ordered, nursing communication entered to allow bedside RN check his blood sugar early and cover with insulin per protocol if blood sugar exceeds 300 mg / dl.        [name] Brinley Treanor 09/13/2021, 2:17 AM

## 2021-09-13 NOTE — Progress Notes (Shared)
Spoke with patient's only relative Hafid Geddis who lives in [place].  Her number is [phone].

## 2021-09-13 NOTE — Progress Notes (Signed)
[place] Progress Note Patient Name: [name] [name] DOB: 1966-03-07 MRN: [id number]   Date of Service  09/13/2021  HPI/Events of Note  Patient's A-V fistula is not working and he needs a dialysis catheter.  eICU Interventions  PCCM Ground crew requested to place a dialysis catheter.        [name] Lajuana Patchell 09/13/2021, 3:14 AM

## 2021-09-13 NOTE — Progress Notes (Signed)
eLink Physician-Brief Progress Note Patient Name: [name] [name] DOB: 10-06-1966 MRN: [id number]   Date of Service  09/13/2021  HPI/Events of Note  Pacemaker failed to capture. Radiology reports a right upper lobe segmental PE.  eICU Interventions  Bedside RN instructed to increase the milliamps to 15 on the TVP, with subsequent capture of the pacemaker, I've asked her to call cardiology and update them as the catheter may have migrated from optimal position. I also verified that Heparin gtt is infusing.        [name] Amarii Bordas 09/13/2021, 12:57 AM

## 2021-09-13 NOTE — Consult Note (Signed)
Renal Service Consult Note [facility]  [name] [name] 09/13/2021 [name] Blazing, MD Requesting Physician: Dr. Lynetta [name]  Reason for Consult: ESRD pt w/ bradycardia HPI: The patient is a 55 y.o. year-old w/ hx of ESRD on HD, pHTN, advanced CHF, and DVT on coumadin, had n/v for a fwe days then chest pain x 1 day and then had syncopal episode on 12/02 and was brought to ED by EMS.  HR 30's en route , transcutaneous pacing en route. In ED labs showed trop 4000, K 6.6. pt rec'd IV Ca, bicarb and levophed drip. Pt went for successful temporary pacemaker done via L fem vein by cardiology. Pt was admitted to ICU.  Plan was for HD but his L arm AVF was clotted, so a temporary HD cath was placed in the groin and pt was started on HD early today around 7am.  Pt seen for renal consult.   Pt is responsive, no c/o's at this time, no CP or SOB.  Lying flat on [place] O2. BP's are soft in the 90's- 100's on HD this am. CXR showed "vasc congestion, IS edema".     ROS - denies CP, no joint pain, no HA, no blurry vision, no rash, no diarrhea   Past Medical History  Past Medical History:  Diagnosis Date   Renal disorder    Past Surgical History History reviewed. No pertinent surgical history. Family History History reviewed. No pertinent family history. Social History  has no history on file for tobacco use, alcohol use, and drug use. Allergies No Known Allergies Home medications Prior to Admission medications   Not on File     Vitals:   09/13/21 0920 09/13/21 0930 09/13/21 0945 09/13/21 1000  BP: 99/61 (!) 86/64 (!) 110/59 116/67  Pulse: 90 90 89 90  Resp: (!) 23 (!) 22 (!) 23 (!) 24  Temp:      TempSrc:      SpO2:  100%    Weight:      Height:       Exam Gen alert, no pressors, responsive, BP 90's- 100's No rash, cyanosis or gangrene Sclera anicteric, throat clear  No jvd or bruits Chest clear bilat to bases, no rales/ wheezing RRR no MRG Abd soft quite obese, ntnd no mass  or ascites +bs GU normal male MS no joint effusions or deformity Ext no LE or UE edema, no wounds or ulcers Neuro moving all ext, responsive, sleepy, nf   LUA AVF no bruit, R femoral temp HD cath      OP HD: MW TTS   130kg  400/500  2/2 bath  LUE AVF    - calcitriol 2.25 ug tiw  - iron 50mg  weekly  - auryxia/ renvela binders  - lokelma 8.4 on non HD days     Na 5.5  BUN 43  cr 8.6  WBC 11 Hb 11.6   CXR vasc congestion, IS edema mild   CT angio chest - diffuse GG changes, small lung volume  Assessment/ Plan: Unstable bradycardia - went for temp venous pacemaker last night. K+ 6.6 in ED, got temporizing measures. Getting HD this morning.  Clotted AVF - in LUE. Will address next week when more stable.  Acute on chronic resp failure - w/ IS edema by CXR, prob edema on CT as well (diffuse GG changes).  BP's are soft in ICU on HD this am.  Will not get large goal, possilby 1.5-2 L.  May need CRRT after HD if  not improving.  NSTEMI - [name] [name], per pmd Volume - no edema of extremities, + edema by CXR.  3-4 kg under dry wt here. Probably losing body wt.  Chronic syst/ diast CHF CAD  Atrial fib - amio on hold d/t low HR Acute encephalopathy - well- dialyzed by labs, doubt uremia Anemia ckd - cont Fe weekly , no esa needed (Hb 11-12)  MBD ckd - resume binders when eating, Ca wnl, phos 7      [name] Calvary Difranco  MD 09/13/2021, 10:01 AM  Recent Labs  Lab 10/05/2021 1425 09/23/2021 1436 09/17/2021 1608 09/13/21 0632  WBC 10.3  --   --  11.9*  HGB 13.1   < > 12.6* 11.6*   < > = values in this interval not displayed.   Recent Labs  Lab 10/05/2021 2050 09/13/21 0632  K 6.5* 5.5*  BUN 41* 43*  CREATININE 8.48* 8.69*  CALCIUM 9.2 8.6*  PHOS  --  7.1*

## 2021-09-14 ENCOUNTER — Other Ambulatory Visit (HOSPITAL_COMMUNITY): Payer: Medicare HMO

## 2021-09-14 ENCOUNTER — Inpatient Hospital Stay (HOSPITAL_COMMUNITY): Payer: Medicare HMO

## 2021-09-14 DIAGNOSIS — I469 Cardiac arrest, cause unspecified: Secondary | ICD-10-CM

## 2021-09-14 DIAGNOSIS — R001 Bradycardia, unspecified: Secondary | ICD-10-CM | POA: Diagnosis not present

## 2021-09-14 DIAGNOSIS — I442 Atrioventricular block, complete: Secondary | ICD-10-CM | POA: Diagnosis not present

## 2021-09-14 LAB — GLUCOSE, CAPILLARY
Glucose-Capillary: 115 mg/dL — ABNORMAL HIGH (ref 70–99)
Glucose-Capillary: 157 mg/dL — ABNORMAL HIGH (ref 70–99)
Glucose-Capillary: 246 mg/dL — ABNORMAL HIGH (ref 70–99)
Glucose-Capillary: 284 mg/dL — ABNORMAL HIGH (ref 70–99)
Glucose-Capillary: 78 mg/dL (ref 70–99)
Glucose-Capillary: 79 mg/dL (ref 70–99)
Glucose-Capillary: 86 mg/dL (ref 70–99)

## 2021-09-14 LAB — BASIC METABOLIC PANEL
Anion gap: 9 (ref 5–15)
BUN: 31 mg/dL — ABNORMAL HIGH (ref 6–20)
CO2: 27 mmol/L (ref 22–32)
Calcium: 8.4 mg/dL — ABNORMAL LOW (ref 8.9–10.3)
Chloride: 97 mmol/L — ABNORMAL LOW (ref 98–111)
Creatinine, Ser: 7.08 mg/dL — ABNORMAL HIGH (ref 0.61–1.24)
GFR, Estimated: 8 mL/min — ABNORMAL LOW (ref >60)
Glucose, Bld: 248 mg/dL — ABNORMAL HIGH (ref 70–99)
Potassium: 4.9 mmol/L (ref 3.5–5.1)
Sodium: 133 mmol/L — ABNORMAL LOW (ref 135–145)

## 2021-09-14 LAB — CBC
HCT: 33.9 % — ABNORMAL LOW (ref 39.0–52.0)
Hemoglobin: 10 g/dL — ABNORMAL LOW (ref 13.0–17.0)
MCH: 29.5 pg (ref 26.0–34.0)
MCHC: 29.5 g/dL — ABNORMAL LOW (ref 30.0–36.0)
MCV: 100 fL (ref 80.0–100.0)
Platelets: 281 10*3/uL (ref 150–400)
RBC: 3.39 MIL/uL — ABNORMAL LOW (ref 4.22–5.81)
RDW: 22.3 % — ABNORMAL HIGH (ref 11.5–15.5)
WBC: 9.7 10*3/uL (ref 4.0–10.5)
nRBC: 0 % (ref 0.0–0.2)

## 2021-09-14 LAB — ECHOCARDIOGRAM COMPLETE
AR max vel: 2.15 cm2
AV Area VTI: 2.28 cm2
AV Area mean vel: 2.06 cm2
AV Mean grad: 8.5 mmHg
AV Peak grad: 15.7 mmHg
Ao pk vel: 1.98 m/s
Area-P 1/2: 5.5 cm2
Height: 70 in
MV VTI: 2.39 cm2
S' Lateral: 4.7 cm
Weight: 4479.75 oz

## 2021-09-14 LAB — HEPARIN LEVEL (UNFRACTIONATED)
Heparin Unfractionated: <0.1 IU/mL — ABNORMAL LOW (ref 0.30–0.70)
Heparin Unfractionated: 0.27 IU/mL — ABNORMAL LOW (ref 0.30–0.70)

## 2021-09-14 LAB — HEPATITIS B SURFACE ANTIBODY, QUANTITATIVE: Hep B S AB Quant (Post): 9.9 m[IU]/mL — ABNORMAL LOW (ref >9.9)

## 2021-09-14 MED ORDER — PATIROMER SORBITEX CALCIUM 8.4 G PO PACK
8.4000 g | PACK | Freq: Every day | ORAL | Status: DC
  Filled 2021-09-14: qty 1

## 2021-09-14 MED ORDER — CLONAZEPAM 0.5 MG PO TABS
0.5000 mg | ORAL_TABLET | Freq: Once | ORAL | Status: AC
  Administered 2021-09-14: 0.5 mg via ORAL
  Filled 2021-09-14: qty 1

## 2021-09-14 MED ORDER — SEVELAMER CARBONATE 800 MG PO TABS
3200.0000 mg | ORAL_TABLET | Freq: Three times a day (TID) | ORAL | Status: DC
  Administered 2021-09-15: 3200 mg via ORAL
  Filled 2021-09-14 (×4): qty 4

## 2021-09-14 MED ORDER — MECLIZINE HCL 12.5 MG PO TABS
12.5000 mg | ORAL_TABLET | Freq: Every day | ORAL | Status: DC | PRN
  Filled 2021-09-14: qty 1

## 2021-09-14 MED ORDER — FERRIC CITRATE 1 GM 210 MG(FE) PO TABS
420.0000 mg | ORAL_TABLET | ORAL | Status: DC
  Administered 2021-09-14 – 2021-09-15 (×2): 420 mg via ORAL
  Filled 2021-09-14 (×7): qty 2

## 2021-09-14 MED ORDER — NITROGLYCERIN 0.4 MG SL SUBL: 0.4000 mg | SUBLINGUAL_TABLET | SUBLINGUAL | Status: DC | PRN

## 2021-09-14 MED ORDER — TICAGRELOR 90 MG PO TABS
90.0000 mg | ORAL_TABLET | Freq: Two times a day (BID) | ORAL | Status: DC
  Administered 2021-09-14 – 2021-09-16 (×4): 90 mg via ORAL
  Filled 2021-09-14 (×4): qty 1

## 2021-09-14 MED ORDER — ACETAMINOPHEN 500 MG PO TABS
500.0000 mg | ORAL_TABLET | Freq: Once | ORAL | Status: AC | PRN
  Administered 2021-09-14: 500 mg via ORAL
  Filled 2021-09-14: qty 1

## 2021-09-14 MED ORDER — MUPIROCIN 2 % EX OINT
1.0000 "application " | TOPICAL_OINTMENT | Freq: Three times a day (TID) | CUTANEOUS | Status: DC
  Administered 2021-09-14 – 2021-09-16 (×4): 1 via TOPICAL
  Filled 2021-09-14: qty 22

## 2021-09-14 MED ORDER — SEVELAMER CARBONATE 800 MG PO TABS
1600.0000 mg | ORAL_TABLET | ORAL | Status: DC
  Administered 2021-09-14 – 2021-09-15 (×2): 1600 mg via ORAL

## 2021-09-14 MED ORDER — PANTOPRAZOLE SODIUM 40 MG PO TBEC
40.0000 mg | DELAYED_RELEASE_TABLET | Freq: Every day | ORAL | Status: DC
Start: 2021-09-14 — End: 2021-09-16
  Administered 2021-09-15 – 2021-09-16 (×2): 40 mg via ORAL
  Filled 2021-09-14 (×2): qty 1

## 2021-09-14 MED ORDER — CINACALCET HCL 30 MG PO TABS
30.0000 mg | ORAL_TABLET | Freq: Every day | ORAL | Status: DC
  Filled 2021-09-14 (×3): qty 1

## 2021-09-14 MED ORDER — PERFLUTREN LIPID MICROSPHERE
1.0000 mL | INTRAVENOUS | Status: AC | PRN
Start: 2021-09-14 — End: 2021-09-14
  Administered 2021-09-14: 2 mL via INTRAVENOUS
  Filled 2021-09-14: qty 10

## 2021-09-14 MED ORDER — CLONAZEPAM 0.5 MG PO TABS
0.5000 mg | ORAL_TABLET | Freq: Two times a day (BID) | ORAL | Status: DC
  Administered 2021-09-15 – 2021-09-16 (×3): 0.5 mg via ORAL
  Filled 2021-09-14 (×3): qty 1

## 2021-09-14 MED ORDER — ACETAMINOPHEN 500 MG PO TABS
500.0000 mg | ORAL_TABLET | Freq: Once | ORAL | Status: AC
  Administered 2021-09-14: 500 mg via ORAL
  Filled 2021-09-14: qty 1

## 2021-09-14 MED ORDER — FERRIC CITRATE 1 GM 210 MG(FE) PO TABS
630.0000 mg | ORAL_TABLET | Freq: Three times a day (TID) | ORAL | Status: DC
  Administered 2021-09-14 – 2021-09-15 (×2): 630 mg via ORAL
  Filled 2021-09-14 (×7): qty 3

## 2021-09-14 MED ORDER — AMIODARONE HCL 200 MG PO TABS
200.0000 mg | ORAL_TABLET | Freq: Every day | ORAL | Status: DC
  Administered 2021-09-14 – 2021-09-16 (×3): 200 mg via ORAL
  Filled 2021-09-14 (×3): qty 1

## 2021-09-14 MED ORDER — INSULIN GLARGINE-YFGN 100 UNIT/ML ~~LOC~~ SOLN
70.0000 [IU] | Freq: Every day | SUBCUTANEOUS | Status: DC
  Administered 2021-09-14 – 2021-09-15 (×2): 70 [IU] via SUBCUTANEOUS
  Filled 2021-09-14 (×4): qty 0.7

## 2021-09-14 MED ORDER — ATORVASTATIN CALCIUM 80 MG PO TABS
80.0000 mg | ORAL_TABLET | Freq: Every day | ORAL | Status: DC
  Administered 2021-09-15 – 2021-09-16 (×2): 80 mg via ORAL
  Filled 2021-09-14 (×2): qty 1

## 2021-09-14 MED ORDER — ROPINIROLE HCL 0.25 MG PO TABS
0.2500 mg | ORAL_TABLET | Freq: Every day | ORAL | Status: DC
  Administered 2021-09-14 – 2021-09-15 (×2): 0.25 mg via ORAL
  Filled 2021-09-14 (×3): qty 1

## 2021-09-14 MED ORDER — PATIROMER SORBITEX CALCIUM 8.4 G PO PACK
8.4000 g | PACK | ORAL | Status: DC
  Administered 2021-09-15 – 2021-09-16 (×2): 8.4 g via ORAL
  Filled 2021-09-14 (×2): qty 1

## 2021-09-14 NOTE — Progress Notes (Signed)
eLink Physician-Brief Progress Note Patient Name: [name] [name] DOB: 05/21/1966 MRN: [id number]   Date of Service  09/14/2021  HPI/Events of Note  Pt continues to moan in pain and writhing in bed. Pt received 0.25 Xanax 12/3 for anxiety, already getting klonopin 0.5 mg BID. ESRD Pt  Camera: Obese, on nasal o2. Talking to bed side RN. VS stable.  He takes 1 mg klonopin q12 hr at home. Here has 0.5 mg q12 hr. Anxious. Pain in rt AC, no infiltration.   eICU Interventions  Klonopin 0.5 mg and tylenol ordering once for now.   Discussed with RN.      Intervention Category Intermediate Interventions: Pain - evaluation and management;Other: (anxiety)  Elmer Sow 09/14/2021, 3:46 AM

## 2021-09-14 NOTE — Progress Notes (Signed)
Progress Note   Subjective   Mentation has improved with dialysis.  AV nodal conduction has also returned. Doing well today, the patient denies CP or SOB.  No new concerns  Inpatient Medications    Scheduled Meds:  aspirin  81 mg Oral Daily   atorvastatin  80 mg Oral Daily   Chlorhexidine Gluconate Cloth  6 each Topical Q0600   clonazePAM  0.5 mg Oral BID   insulin aspart  0-20 Units Subcutaneous TID WC   insulin aspart  4 Units Subcutaneous TID WC   insulin glargine-yfgn  50 Units Subcutaneous QHS   midodrine  10 mg Oral TID WC   ondansetron (ZOFRAN) IV  4 mg Intravenous Once   pantoprazole (PROTONIX) IV  40 mg Intravenous QHS   ticagrelor  90 mg Oral BID   Continuous Infusions:  sodium chloride Stopped (09/13/21 1550)   sodium chloride     heparin 2,000 Units/hr (09/14/21 0400)   norepinephrine (LEVOPHED) Adult infusion 10 mcg/min (09/13/21 2336)   PRN Meds: sodium chloride, sodium chloride, alteplase, docusate sodium, heparin, lidocaine (PF), lidocaine-prilocaine, ondansetron, pentafluoroprop-tetrafluoroeth, polyethylene glycol, sodium chloride flush   Vital Signs    Vitals:   09/14/21 0615 09/14/21 0630 09/14/21 0645 09/14/21 0804  BP: 114/78 134/72 (!) 114/101   Pulse: 81 81 87   Resp: (!) 26 (!) 27 20   Temp:    97.7 F (36.5 C)  TempSrc:    Oral  SpO2: 98% 99% 99%   Weight:      Height:        Intake/Output Summary (Last 24 hours) at 09/14/2021 0836 Last data filed at 09/14/2021 0400 Gross per 24 hour  Intake 729.18 ml  Output 1277 ml  Net -547.82 ml   Filed Weights   09/13/21 0715 09/13/21 1140 09/14/21 0500  Weight: 126.8 kg 125.5 kg 127 kg    Telemetry    Sinus rhythm with 1:1 AV conduction - Personally Reviewed  Physical Exam   GEN- The patient is chronically ill appearing, alert and much less confused today Head- normocephalic, atraumatic Eyes-  Sclera clear, conjunctiva pink Ears- hearing intact Oropharynx- clear Neck-  supple, Lungs-  normal work of breathing Heart- Regular rate and rhythm  GI- soft  Extremities- no clubbing, cyanosis, + dependant edema  MS- no significant deformity or atrophy   Labs    Chemistry Recent Labs  Lab 10/09/2021 1425 09/27/2021 1432 09/21/2021 2050 09/13/21 0632 09/14/21 0500  NA 132*   < > 131* 134* 133*  K 6.0*   < > 6.5* 5.5* 4.9  CL 92*   < > 90* 95* 97*  CO2 25   < > 26 28 27   GLUCOSE 425*   < > 472* 305* 248*  BUN 35*   < > 41* 43* 31*  CREATININE 8.10*   < > 8.48* 8.69* 7.08*  CALCIUM 9.6   < > 9.2 8.6* 8.4*  PROT 7.6  --   --   --   --   ALBUMIN 3.4*  --   --   --   --   AST 37  --   --   --   --   ALT 15  --   --   --   --   ALKPHOS 62  --   --   --   --   BILITOT 1.6*  --   --   --   --   GFRNONAA 7*   < > 7* 7*  8*  ANIONGAP 15   < > 15 11 9    < > = values in this interval not displayed.     Hematology Recent Labs  Lab 09/13/21 9470 09/13/21 2313 09/14/21 0500  WBC 11.9* 9.8 9.7  RBC 3.85* 3.53* 3.39*  HGB 11.6* 10.5* 10.0*  HCT 38.0* 35.6* 33.9*  MCV 98.7 100.8* 100.0  MCH 30.1 29.7 29.5  MCHC 30.5 29.5* 29.5*  RDW 22.4* 22.4* 22.3*  PLT 301 287 281     Patient ID  [name] [name] is a 55 y.o. male with a history of ESRD on HD, COPD, AI, RBBB, OSA, DMII, persistent atrial flutter, chronic OAC on coumadin, chronic DVT, chronic diastolic CHF, h/o endocarditis in 2016 who is being seen today for the evaluation of syncope and bradycardia at the request of Dr. Tyrone Nine.  Assessment & Plan    1.  Complete heart block Clinically improved with dialysis 1:1 AV conduction has returned.  Hopefully we can avoid pacing in this gentleman.  He would certainly be high risk with any EP procedures.  2. NSTEMI Likely due to acute medical illness No plans for cath at this time  3. Prior DVT On heparin per primary team  4. H/o afib Currently in sinus rhythm  5. Chronic systolic dysfunction Volume managed with HD May benefit from general cardiology  management as he improves  6. ESRD/ hyperkalemia He is clinically much improved with HD  Cardiology/ EP to follow  Thompson Grayer MD, [facility] 09/14/2021 8:36 AM

## 2021-09-14 NOTE — Progress Notes (Signed)
NAME:  [name] [name], MRN:  [id number], DOB:  1966-06-09, LOS: 2 ADMISSION DATE:  10/11/2021, CONSULTATION DATE:  10/03/2021 REFERRING MD:  Kathrynn Humble, CHIEF COMPLAINT:  syncope   History of Present Illness:  24yM with ESRD on HD via AVF, advanced CHF, pulmonary hypertension, DVT on warfarin who had had nausea and vomiting for few days, R sided CP yesterday and then syncope today. He was BIBEMS who found him to have HR in 30s to 40s, transcutaneous pacing started en route.   In ED arterial line placed, trop 4k, K found to be 6.6 on istat, 1g Ca gluconate, 2 amps bicarb given. Levo drip titrated down to 10 mcg/min. Remained dependent on transcutaneous pacing however, EP planning to place temporary transvenous pacemaker.   Pertinent  Medical History  ESRD DVT Chronic systolic and diastolic heart failure Multifactorial pulmonary hypertension CAD OSA not using CPAP  Significant Hospital Events: Including procedures, antibiotic start and stop dates in addition to other pertinent events   09/30/2021 arterial line placed, transcutaneous pacing, transvenous pacemaker placed in lab 10/04/2021 Started on IHD  Interim History / Subjective:   Tolerated HD yesterday. Denies dyspnea.  Weaning norepinephrine.  Difficult to get accurate blood pressure due to patient body habitus improvement.  Objective   Blood pressure 90/67, pulse 84, temperature 97.7 F (36.5 C), temperature source Oral, resp. rate 18, height 5\' 10"  (1.778 m), weight 127 kg, SpO2 100 %.        Intake/Output Summary (Last 24 hours) at 09/14/2021 1255 Last data filed at 09/14/2021 0900 Gross per 24 hour  Intake 1051.07 ml  Output --  Net 1051.07 ml    Filed Weights   09/13/21 0715 09/13/21 1140 09/14/21 0500  Weight: 126.8 kg 125.5 kg 127 kg    Examination: General appearance: 55 y.o., male, drowsy but arousable and answers appropriately, morbid obesity.  Eyes: PERRL, tracking appropriately HENT: NCAT; dry MM Lungs: clear  bilaterally  CV: In sinus rhythm.  Pacemaker set at backup rate of 60. Abdomen: Soft, non-tender; non-distended, BS present  Extremities: trace peripheral edema, cool, multiple excoriations legs.  Neuro: grossly nonfocal  Ancillary tests personally reviewed:  Potassium now 4.9. Remains hyperglycemic. Assessment & Plan:   Critically ill due to cardiogenic shock due to complete heart block with slow escape requiring TVPM and titration of NE NSTEMI  Acute on chronic hypoxic respiratory failure.  Toxic metabolic encephalopathy due to under-dialysis and hypotension.  Chronic systolic and diastolic heart failure due to ICM:  CAD Persistent atrial fibrillation  ESRD stage V with hyperkalemia Chronic hypotension  DVT DM type 2  - uncontrolled   Plan :  - For IHD today. Follow K, support BP with NE to keep MAP >65. Predominantly needs dialysis rather than ultrafiltration as doesn't appear very overloaded.  -Bradycardia has resolved.  EP to decide regarding permanent pacemaker implantation for backup tomorrow. -Continue to wean norepinephrine to keep MAP greater than 65. - Continue heparin for ACS - Cardiology following.  - Refused CPAP for OSA.  - Resume clonazepam for anxiety.  - Continue secondary prevention with brillinta and atorvastatin.  -Increase basal insulin coverage.    Best Practice (right click and "Reselect all SmartList Selections" daily)   Diet/type: Regular consistency (see orders) DVT prophylaxis: systemic heparin GI prophylaxis: PPI Lines: Central line Foley:  N/A Code Status:  full code Last date of multidisciplinary goals of care discussion [will attempt to reach out to family today]  CRITICAL CARE Performed by: Kipp Brood   Total  critical care time: 35 minutes  Critical care time was exclusive of separately billable procedures and treating other patients.  Critical care was necessary to treat or prevent imminent or life-threatening  deterioration.  Critical care was time spent personally by me on the following activities: development of treatment plan with patient and/or surrogate as well as nursing, discussions with consultants, evaluation of patient's response to treatment, examination of patient, obtaining history from patient or surrogate, ordering and performing treatments and interventions, ordering and review of laboratory studies, ordering and review of radiographic studies, pulse oximetry, re-evaluation of patient's condition and participation in multidisciplinary rounds.  Kipp Brood, MD [facility] ICU Physician [place]  Pager: [phone] Mobile: [phone] After hours: [phone].

## 2021-09-14 NOTE — Progress Notes (Signed)
[place] Progress Note Patient Name: Detrick Dani DOB: 12-18-1965 MRN: [id number]   Date of Service  09/14/2021  HPI/Events of Note  Pt with c/o HA and generalized discomfort. No PRN analgesics ordered. AST/ALT normal. SBP < 150.  eICU Interventions  Tylenol ordered prn     Intervention Category Intermediate Interventions: Pain - evaluation and management  Elmer Sow 09/14/2021, 12:39 AM

## 2021-09-14 NOTE — Progress Notes (Signed)
[facility] Progress Note  Subjective: on levo gtt 10 > 12 > 6 ug /min this am. Pt Ox 3 and interactive, not getting much sleep per the RN.  UF w/ HD yest 1.3 L UF.  BP's were low on pressor support.   Vitals:   09/14/21 0815 09/14/21 0854 09/14/21 0907 09/14/21 0923  BP: (!) 105/51 (!) 87/57 100/66 90/67  Pulse: 85 82 94 84  Resp: (!) 25 (!) 21 (!) 26 18  Temp:      TempSrc:      SpO2: 99% 100% 100% 100%  Weight:      Height:        Exam:   alert, nad, obese , lying flat  , HFNC 5 L  no jvd  Chest cta bilat  Cor reg no RG  Abd soft ntnd no ascites   Ext no LE edema   Alert, NF, ox3    LUA AVF no bruit, temp HD cath R groin     OP HD: MW TTS   130kg  400/500  2/2 bath  LUE AVF    - calcitriol 2.25 ug tiw  - iron 50mg  weekly  - auryxia/ renvela binders  - lokelma 8.4 on non HD days     CXR 12/03 - vasc congestion, IS edema mild   CT 12/03 angio chest - diffuse GG changes, small lung volumes   Assessment/ Plan: Unstable bradycardia - temp venous pacemaker placed 12/ K+ 6.6 in ED, got temporizing measures. Getting HD this morning.  Clotted AVF - in LUE. Will address next week when more stable.  Acute on chronic resp failure - w/ IS edema by CXR, prob edema on CT as well (diffuse GG changes).  BP's are soft in ICU on pressors x 1.  Small UF 1.4 L w/ HD yesterday. Is below dry wt already. CXR showed IS edema on admission. Will repeat CXR. If need fluid off will need CRRT.    NSTEMI - Cleora Fleet, per pmd Volume - no edema of extremities, + edema by CXR.  3 kg under dry wt here. May be losing body wt.  Chronic syst/ diast CHF CAD  Atrial fib - amio on hold d/t low HR Acute encephalopathy - improved w/ dialysis Anemia ckd - cont Fe weekly , no esa needed (Hb 11-12)  MBD ckd - resume binders when eating, Ca wnl, phos 7       Rob Brienna Bass 09/14/2021, 12:50 PM   Recent Labs  Lab 09/13/21 0632 09/13/21 2313 09/14/21 0500  K 5.5*  --  4.9  BUN 43*  --  31*   CREATININE 8.69*  --  7.08*  CALCIUM 8.6*  --  8.4*  PHOS 7.1*  --   --   HGB 11.6* 10.5* 10.0*   Inpatient medications:  aspirin  81 mg Oral Daily   atorvastatin  80 mg Oral Daily   Chlorhexidine Gluconate Cloth  6 each Topical Q0600   clonazePAM  0.5 mg Oral BID   insulin aspart  0-20 Units Subcutaneous TID WC   insulin aspart  4 Units Subcutaneous TID WC   insulin glargine-yfgn  50 Units Subcutaneous QHS   midodrine  10 mg Oral TID WC   ondansetron (ZOFRAN) IV  4 mg Intravenous Once   pantoprazole (PROTONIX) IV  40 mg Intravenous QHS   ticagrelor  90 mg Oral BID    sodium chloride Stopped (09/13/21 1550)   sodium chloride     heparin 2,000 Units/hr (  09/14/21 0900)   norepinephrine (LEVOPHED) Adult infusion 6 mcg/min (09/14/21 0900)   sodium chloride, sodium chloride, alteplase, docusate sodium, heparin, lidocaine (PF), lidocaine-prilocaine, ondansetron, pentafluoroprop-tetrafluoroeth, perflutren lipid microspheres (DEFINITY) IV suspension, polyethylene glycol, sodium chloride flush

## 2021-09-14 NOTE — Progress Notes (Signed)
ANTICOAGULATION CONSULT NOTE  Pharmacy Consult:  Heparin Indication: atrial fibrillation  Allergies  Allergen Reactions   Ace Inhibitors Cough   Pork-Derived Products Other (See Comments)    Entry determined to be clinically insignificant: OK TO GIVE HEPARIN (has tolerated well in the past)  Patient had allergic episode in 1989 after he ate pork at a fair. He developed fever and rash and went to the ED, but he did not have breathing issues. Received heparin in the past. Patient ok with removing pork allergy and listing as intolerance instead.     Patient Measurements: Height: 5\' 10"  (177.8 cm) Weight: 125.5 kg (276 lb 10.8 oz) IBW/kg (Calculated) : 73 Heparin Dosing Weight: 104 kg  Vital Signs: Temp: 97.9 F (36.6 C) (12/03 2345) Temp Source: Oral (12/03 2345) BP: 89/69 (12/04 0115) Pulse Rate: 88 (12/04 0115)  Labs: Recent Labs    10/07/2021 1425 09/19/2021 1432 09/23/2021 1608 10/10/2021 2020 10/11/2021 2050 09/13/21 0600 09/13/21 0632 09/13/21 1531 09/13/21 2313 09/14/21 0041  HGB 13.1   < > 12.6*  --   --   --  11.6*  --  10.5*  --   HCT 43.2   < > 37.0*  --   --   --  38.0*  --  35.6*  --   PLT 266  --   --   --   --   --  301  --  287  --   LABPROT 18.3*  --   --   --   --  21.3*  --   --   --   --   INR 1.5*  --   --   --   --  1.9*  --   --   --   --   HEPARINUNFRC  --   --   --   --   --  <0.10*  --  <0.10*  --  <0.10*  CREATININE 8.10*   < >  --  8.54* 8.48*  --  8.69*  --   --   --   TROPONINIHS 4,493*  --   --   --  5,596*  --   --   --   --   --    < > = values in this interval not displayed.     Estimated Creatinine Clearance: 12.5 mL/min (A) (by C-G formula based on SCr of 8.69 mg/dL (H)).   Assessment: 55 y.o. male with h/o Afib and DVT, new RUL PE, for heparin  Goal of Therapy:  Heparin level 0.3-0.7 units/ml Monitor platelets by anticoagulation protocol: Yes   Plan:  Increase Heparin 2000 units/hr Check heparin level in 8 hours.  Phillis Knack,  PharmD, BCPS

## 2021-09-14 NOTE — Progress Notes (Signed)
Valuables itemized with pt at bedside, placed in a valuables envelope (#[id number]) and secured at bedside.  Pt and I both witnessed each others signatures.  Personally took items in secured valuables envelope to security office.  Valuables envelope turned over to security officer Francesco Sor who then placed secured valuables envelope in a plastic security bag [phone].  I witnessed Officer Kristine Garbe place valuables envelope in locker #6 and secure it.

## 2021-09-14 NOTE — Progress Notes (Signed)
ANTICOAGULATION CONSULT NOTE   Pharmacy Consult:  Heparin Indication: atrial fibrillation  Allergies  Allergen Reactions   Ace Inhibitors Cough   Pork-Derived Products Other (See Comments)    Entry determined to be clinically insignificant: OK TO GIVE HEPARIN (has tolerated well in the past)  Patient had allergic episode in 1989 after he ate pork at a fair. He developed fever and rash and went to the ED, but he did not have breathing issues. Received heparin in the past. Patient ok with removing pork allergy and listing as intolerance instead.     Patient Measurements: Height: 5\' 10"  (177.8 cm) Weight: 127 kg (279 lb 15.8 oz) IBW/kg (Calculated) : 73 Heparin Dosing Weight: 104 kg  Vital Signs: Temp: 97.7 F (36.5 C) (12/04 0804) Temp Source: Oral (12/04 0804) BP: 90/67 (12/04 0923) Pulse Rate: 84 (12/04 0923)  Labs: Recent Labs    10/06/2021 1425 09/13/2021 1425 09/23/2021 1432 09/19/2021 2050 09/13/21 0600 09/13/21 0632 09/13/21 1531 09/13/21 2313 09/14/21 0041 09/14/21 0500 09/14/21 1000  HGB 13.1  --    < >  --   --  11.6*  --  10.5*  --  10.0*  --   HCT 43.2  --    < >  --   --  38.0*  --  35.6*  --  33.9*  --   PLT 266  --   --   --   --  301  --  287  --  281  --   LABPROT 18.3*  --   --   --  21.3*  --   --   --   --   --   --   INR 1.5*  --   --   --  1.9*  --   --   --   --   --   --   HEPARINUNFRC  --    < >  --   --  <0.10*  --  <0.10*  --  <0.10*  --  0.27*  CREATININE 8.10*  --    < > 8.48*  --  8.69*  --   --   --  7.08*  --   TROPONINIHS 4,493*  --   --  5,596*  --   --   --   --   --   --   --    < > = values in this interval not displayed.     Estimated Creatinine Clearance: 15.4 mL/min (A) (by C-G formula based on SCr of 7.08 mg/dL (H)).   Assessment: 34 YOM presented with nausea and vomiting for 2 days and syncope.  Patient was hypotensive and bradycardic, requiring transcutaneous pacing.  S/p cath with temporary transvenous pacemaker insertion.   Pharmacy consulted to start IV heparin while Coumadin is on hold.  INR is sub-therapeutic on admit.  Pt noted to have PE in CT chest yesterday. Heparin level slightly below goal at 0.27, bleeding from overnight resolved.  Goal of Therapy:  Heparin level 0.3-0.7 units/ml Monitor platelets by anticoagulation protocol: Yes   Plan:  Increase IV heparin to 2150 units/hr. Daily heparin level and CBC.  Arrie Senate, PharmD, BCPS, [facility] Clinical Pharmacist [phone] Please check AMION for all [place] numbers 09/14/2021

## 2021-09-15 ENCOUNTER — Encounter (HOSPITAL_COMMUNITY): Payer: Self-pay | Admitting: Cardiovascular Disease

## 2021-09-15 ENCOUNTER — Inpatient Hospital Stay (HOSPITAL_COMMUNITY): Payer: Medicare HMO | Admitting: Anesthesiology

## 2021-09-15 ENCOUNTER — Inpatient Hospital Stay (HOSPITAL_COMMUNITY): Admission: EM | Disposition: E | Payer: Self-pay | Source: Home / Self Care | Attending: Critical Care Medicine

## 2021-09-15 ENCOUNTER — Other Ambulatory Visit: Payer: Self-pay | Admitting: Internal Medicine

## 2021-09-15 ENCOUNTER — Encounter (HOSPITAL_COMMUNITY): Admission: EM | Disposition: E | Payer: Self-pay | Source: Home / Self Care | Attending: Critical Care Medicine

## 2021-09-15 DIAGNOSIS — N186 End stage renal disease: Secondary | ICD-10-CM

## 2021-09-15 DIAGNOSIS — I950 Idiopathic hypotension: Secondary | ICD-10-CM | POA: Diagnosis not present

## 2021-09-15 DIAGNOSIS — R001 Bradycardia, unspecified: Secondary | ICD-10-CM | POA: Diagnosis not present

## 2021-09-15 DIAGNOSIS — J9601 Acute respiratory failure with hypoxia: Secondary | ICD-10-CM

## 2021-09-15 DIAGNOSIS — I442 Atrioventricular block, complete: Secondary | ICD-10-CM | POA: Diagnosis not present

## 2021-09-15 HISTORY — PX: TEMPORARY PACEMAKER: CATH118268

## 2021-09-15 HISTORY — PX: ARTERIAL LINE INSERTION: CATH118227

## 2021-09-15 LAB — CBC
HCT: 30.6 % — ABNORMAL LOW (ref 39.0–52.0)
HCT: 33.1 % — ABNORMAL LOW (ref 39.0–52.0)
Hemoglobin: 9.1 g/dL — ABNORMAL LOW (ref 13.0–17.0)
Hemoglobin: 10.2 g/dL — ABNORMAL LOW (ref 13.0–17.0)
MCH: 29.7 pg (ref 26.0–34.0)
MCH: 30.8 pg (ref 26.0–34.0)
MCHC: 29.7 g/dL — ABNORMAL LOW (ref 30.0–36.0)
MCHC: 30.8 g/dL (ref 30.0–36.0)
MCV: 100 fL (ref 80.0–100.0)
MCV: 100 fL (ref 80.0–100.0)
Platelets: 259 10*3/uL (ref 150–400)
Platelets: 280 10*3/uL (ref 150–400)
RBC: 3.06 MIL/uL — ABNORMAL LOW (ref 4.22–5.81)
RBC: 3.31 MIL/uL — ABNORMAL LOW (ref 4.22–5.81)
RDW: 22 % — ABNORMAL HIGH (ref 11.5–15.5)
RDW: 22.4 % — ABNORMAL HIGH (ref 11.5–15.5)
WBC: 8.2 10*3/uL (ref 4.0–10.5)
WBC: 9.2 10*3/uL (ref 4.0–10.5)
nRBC: 0 % (ref 0.0–0.2)
nRBC: 0.2 % (ref 0.0–0.2)

## 2021-09-15 LAB — GLUCOSE, CAPILLARY
Glucose-Capillary: 136 mg/dL — ABNORMAL HIGH (ref 70–99)
Glucose-Capillary: 71 mg/dL (ref 70–99)
Glucose-Capillary: 96 mg/dL (ref 70–99)
Glucose-Capillary: 265 mg/dL — ABNORMAL HIGH (ref 70–99)
Glucose-Capillary: 80 mg/dL (ref 70–99)
Glucose-Capillary: 168 mg/dL — ABNORMAL HIGH (ref 70–99)
Glucose-Capillary: 155 mg/dL — ABNORMAL HIGH (ref 70–99)

## 2021-09-15 LAB — POCT I-STAT EG7
Acid-base deficit: 3 mmol/L — ABNORMAL HIGH (ref 0.0–2.0)
Bicarbonate: 24.5 mmol/L (ref 20.0–28.0)
Calcium, Ion: 1.12 mmol/L — ABNORMAL LOW (ref 1.15–1.40)
HCT: 32 % — ABNORMAL LOW (ref 39.0–52.0)
Hemoglobin: 10.9 g/dL — ABNORMAL LOW (ref 13.0–17.0)
O2 Saturation: 33 %
Patient temperature: 97.7
Potassium: 5.5 mmol/L — ABNORMAL HIGH (ref 3.5–5.1)
Sodium: 134 mmol/L — ABNORMAL LOW (ref 135–145)
TCO2: 26 mmol/L (ref 22–32)
pCO2, Ven: 54.9 mmHg (ref 44.0–60.0)
pH, Ven: 7.254 (ref 7.250–7.430)
pO2, Ven: 23 mmHg — CL (ref 32.0–45.0)

## 2021-09-15 LAB — BASIC METABOLIC PANEL
Anion gap: 12 (ref 5–15)
BUN: 51 mg/dL — ABNORMAL HIGH (ref 6–20)
CO2: 21 mmol/L — ABNORMAL LOW (ref 22–32)
Calcium: 8.3 mg/dL — ABNORMAL LOW (ref 8.9–10.3)
Chloride: 99 mmol/L (ref 98–111)
Creatinine, Ser: 9.44 mg/dL — ABNORMAL HIGH (ref 0.61–1.24)
GFR, Estimated: 6 mL/min — ABNORMAL LOW (ref >60)
Glucose, Bld: 177 mg/dL — ABNORMAL HIGH (ref 70–99)
Potassium: 5.6 mmol/L — ABNORMAL HIGH (ref 3.5–5.1)
Sodium: 132 mmol/L — ABNORMAL LOW (ref 135–145)

## 2021-09-15 LAB — RENAL FUNCTION PANEL
Albumin: 2.7 g/dL — ABNORMAL LOW (ref 3.5–5.0)
Anion gap: 8 (ref 5–15)
BUN: 45 mg/dL — ABNORMAL HIGH (ref 6–20)
CO2: 27 mmol/L (ref 22–32)
Calcium: 8.4 mg/dL — ABNORMAL LOW (ref 8.9–10.3)
Chloride: 97 mmol/L — ABNORMAL LOW (ref 98–111)
Creatinine, Ser: 8.67 mg/dL — ABNORMAL HIGH (ref 0.61–1.24)
GFR, Estimated: 7 mL/min — ABNORMAL LOW (ref >60)
Glucose, Bld: 137 mg/dL — ABNORMAL HIGH (ref 70–99)
Phosphorus: 7 mg/dL — ABNORMAL HIGH (ref 2.5–4.6)
Potassium: 5.2 mmol/L — ABNORMAL HIGH (ref 3.5–5.1)
Sodium: 132 mmol/L — ABNORMAL LOW (ref 135–145)

## 2021-09-15 LAB — HEPARIN LEVEL (UNFRACTIONATED): Heparin Unfractionated: 0.41 IU/mL (ref 0.30–0.70)

## 2021-09-15 LAB — APTT: aPTT: >200 seconds (ref 24–36)

## 2021-09-15 SURGERY — PACEMAKER IMPLANT

## 2021-09-15 SURGERY — TEMPORARY PACEMAKER
Anesthesia: LOCAL

## 2021-09-15 MED ORDER — FENTANYL 2500MCG IN NS 250ML (10MCG/ML) PREMIX INFUSION
0.0000 ug/h | INTRAVENOUS | Status: DC
  Administered 2021-09-15: 25 ug/h via INTRAVENOUS
  Administered 2021-09-16: 100 ug/h via INTRAVENOUS
  Administered 2021-09-16 – 2021-09-17 (×3): 150 ug/h via INTRAVENOUS
  Administered 2021-09-18: 250 ug/h via INTRAVENOUS
  Administered 2021-09-18 – 2021-09-19 (×2): 200 ug/h via INTRAVENOUS
  Filled 2021-09-15 (×7): qty 250

## 2021-09-15 MED ORDER — ETOMIDATE 2 MG/ML IV SOLN
INTRAVENOUS | Status: DC | PRN
  Administered 2021-09-15: 12 mg via INTRAVENOUS

## 2021-09-15 MED ORDER — ISOPROTERENOL HCL 0.2 MG/ML IJ SOLN
2.0000 ug/min | INTRAVENOUS | Status: DC
  Filled 2021-09-15: qty 5

## 2021-09-15 MED ORDER — MIDODRINE HCL 5 MG PO TABS: 15.0000 mg | ORAL_TABLET | Freq: Three times a day (TID) | ORAL | Status: DC

## 2021-09-15 MED ORDER — LORAZEPAM 2 MG/ML IJ SOLN
INTRAMUSCULAR | Status: AC
  Administered 2021-09-15: 0.5 mg via INTRAVENOUS
  Filled 2021-09-15: qty 1

## 2021-09-15 MED ORDER — FENTANYL CITRATE PF 50 MCG/ML IJ SOSY: 12.5000 ug | PREFILLED_SYRINGE | Freq: Once | INTRAMUSCULAR | Status: DC

## 2021-09-15 MED ORDER — SUCCINYLCHOLINE CHLORIDE 200 MG/10ML IV SOSY
PREFILLED_SYRINGE | INTRAVENOUS | Status: DC | PRN
  Administered 2021-09-15: 120 mg via INTRAVENOUS

## 2021-09-15 MED ORDER — ATROPINE SULFATE 1 MG/10ML IJ SOSY
PREFILLED_SYRINGE | INTRAMUSCULAR | Status: AC
  Filled 2021-09-15: qty 10

## 2021-09-15 MED ORDER — DOPAMINE-DEXTROSE 3.2-5 MG/ML-% IV SOLN
INTRAVENOUS | Status: AC
  Filled 2021-09-15: qty 250

## 2021-09-15 MED ORDER — LIDOCAINE HCL (PF) 1 % IJ SOLN
INTRAMUSCULAR | Status: DC | PRN
  Administered 2021-09-15: 5 mL
  Administered 2021-09-15: 15 mL
  Administered 2021-09-16: 2 mL

## 2021-09-15 MED ORDER — HEPARIN (PORCINE) IN NACL 2000-0.9 UNIT/L-% IV SOLN
INTRAVENOUS | Status: AC
  Filled 2021-09-15: qty 1000

## 2021-09-15 MED ORDER — LORAZEPAM 2 MG/ML IJ SOLN: 0.5000 mg | Freq: Once | INTRAMUSCULAR | Status: AC

## 2021-09-15 MED ORDER — "THROMBI-PAD 3""X3"" EX PADS"
1.0000 | MEDICATED_PAD | CUTANEOUS | Status: AC
  Filled 2021-09-15: qty 1

## 2021-09-15 MED ORDER — FENTANYL CITRATE PF 50 MCG/ML IJ SOSY
PREFILLED_SYRINGE | INTRAMUSCULAR | Status: AC
  Filled 2021-09-15: qty 1

## 2021-09-15 MED ORDER — ISOPROTERENOL HCL 0.2 MG/ML IJ SOLN: 2.0000 ug/min | INTRAVENOUS | Status: DC

## 2021-09-15 MED ORDER — MIDAZOLAM-SODIUM CHLORIDE 100-0.9 MG/100ML-% IV SOLN
0.5000 mg/h | INTRAVENOUS | Status: DC
Start: 2021-09-15 — End: 2021-09-18
  Administered 2021-09-15: 0.5 mg/h via INTRAVENOUS
  Administered 2021-09-16 – 2021-09-17 (×2): 4 mg/h via INTRAVENOUS
  Filled 2021-09-15 (×3): qty 100

## 2021-09-15 MED ORDER — GLUCOSE 4 G PO CHEW
CHEWABLE_TABLET | ORAL | Status: AC
  Administered 2021-09-15: 4 g
  Filled 2021-09-15: qty 1

## 2021-09-15 MED ORDER — HEPARIN (PORCINE) IN NACL 1000-0.9 UT/500ML-% IV SOLN
INTRAVENOUS | Status: DC | PRN
  Administered 2021-09-15: 500 mL

## 2021-09-15 MED ORDER — MIDODRINE HCL 5 MG PO TABS
10.0000 mg | ORAL_TABLET | Freq: Three times a day (TID) | ORAL | Status: DC
  Administered 2021-09-15 – 2021-09-16 (×4): 10 mg via ORAL
  Filled 2021-09-15 (×4): qty 2

## 2021-09-15 MED ORDER — LIDOCAINE HCL (PF) 1 % IJ SOLN
INTRAMUSCULAR | Status: AC
  Filled 2021-09-15: qty 30

## 2021-09-15 MED ORDER — DOPAMINE-DEXTROSE 3.2-5 MG/ML-% IV SOLN
0.0000 ug/kg/min | INTRAVENOUS | Status: DC
  Administered 2021-09-15: 5 ug/kg/min via INTRAVENOUS
  Administered 2021-09-16: 8 ug/kg/min via INTRAVENOUS
  Administered 2021-09-16: 15 ug/kg/min via INTRAVENOUS
  Administered 2021-09-17: 5 ug/kg/min via INTRAVENOUS
  Administered 2021-09-18: 10 ug/kg/min via INTRAVENOUS
  Administered 2021-09-19: 8 ug/kg/min via INTRAVENOUS
  Administered 2021-09-19: 9 ug/kg/min via INTRAVENOUS
  Administered 2021-09-20: 7 ug/kg/min via INTRAVENOUS
  Administered 2021-09-21: 10 ug/kg/min via INTRAVENOUS
  Administered 2021-09-21 – 2021-09-22 (×12): 50 ug/kg/min via INTRAVENOUS
  Filled 2021-09-15 (×14): qty 250
  Filled 2021-09-15: qty 500
  Filled 2021-09-15 (×7): qty 250

## 2021-09-15 SURGICAL SUPPLY — 15 items
CABLE ADAPT PACING TEMP 12FT (ADAPTER) ×3 IMPLANT
CATH S G BIP PACING (CATHETERS) ×3 IMPLANT
GLIDESHEATH SLEND SS 6F .021 (SHEATH) ×3 IMPLANT
GUIDEWIRE INQWIRE 1.5J.035X260 (WIRE) ×1 IMPLANT
INQWIRE 1.5J .035X260CM (WIRE) ×3
PINNACLE LONG 6F 25CM (SHEATH) ×3
Radial art line ×3 IMPLANT
SHEATH GLIDE SLENDER 4/5FR (SHEATH) IMPLANT
SHEATH INTRO PINNACLE 6F 25CM (SHEATH) ×1 IMPLANT
SHEATH PINNACLE 5F 10CM (SHEATH) ×3 IMPLANT
SHEATH PINNACLE 6F 10CM (SHEATH) ×3 IMPLANT
SHEATH PROBE COVER 6X72 (BAG) ×3 IMPLANT
SLEEVE REPOSITIONING LENGTH 30 (MISCELLANEOUS) ×3 IMPLANT
WIRE MICRO SET SILHO 5FR 7 (SHEATH) ×3 IMPLANT
WIRE MICROINTRODUCER 60CM (WIRE) ×3 IMPLANT

## 2021-09-15 NOTE — Progress Notes (Signed)
Critical care event note:  Called emergently to bedside by eICU.  The patient had developed progressive pauses on ECG.  He then went asystolic.  Patient already had transcutaneous pacing pads in place and these were initiated immediately.  On arrival the patient was awake and alert complaining of mild chest discomfort in the area of his transcutaneous pacing pads.  He also states that he is nauseous and feels like he needs to have a bowel movement.  Pacemaker has good capture.  He did require initiation and increased dose of Levophed during this event.  Cardiology came to bedside and will be taking the patient to the Cath Lab for placement of transvenous pacemaker.  We will continue to monitor as needed.

## 2021-09-15 NOTE — H&P (View-Only) (Signed)
Progress Note  Patient Name: [name] [name] Date of Encounter: 10/02/2021  [facility] Cardiologist: None Dr. Julianne Handler  Subjective   "I'm doing OK"  Inpatient Medications    Scheduled Meds:  amiodarone  200 mg Oral Daily   aspirin  81 mg Oral Daily   atorvastatin  80 mg Oral Daily   Chlorhexidine Gluconate Cloth  6 each Topical Q0600   cinacalcet  30 mg Oral Q breakfast   clonazePAM  0.5 mg Oral BID   ferric citrate  420 mg Oral With snacks   ferric citrate  630 mg Oral TID WC   insulin aspart  0-20 Units Subcutaneous TID WC   insulin aspart  4 Units Subcutaneous TID WC   insulin glargine-yfgn  70 Units Subcutaneous QHS   midodrine  10 mg Oral TID WC   mupirocin ointment  1 application Topical TID   ondansetron (ZOFRAN) IV  4 mg Intravenous Once   pantoprazole  40 mg Oral Daily   patiromer  8.4 g Oral Q24H   rOPINIRole  0.25 mg Oral QHS   sevelamer carbonate  1,600 mg Oral With snacks   sevelamer carbonate  3,200 mg Oral TID WC   ticagrelor  90 mg Oral BID   Continuous Infusions:  sodium chloride Stopped (09/13/21 1550)   sodium chloride     heparin 2,150 Units/hr (09/19/2021 0600)   norepinephrine (LEVOPHED) Adult infusion 4 mcg/min (09/24/2021 0600)   PRN Meds: sodium chloride, sodium chloride, alteplase, docusate sodium, heparin, lidocaine (PF), lidocaine-prilocaine, meclizine, nitroGLYCERIN, ondansetron, pentafluoroprop-tetrafluoroeth, polyethylene glycol, sodium chloride flush   Vital Signs    Vitals:   09/11/2021 0600 09/19/2021 0615 10/07/2021 0630 09/23/2021 0645  BP: (!) 94/54 98/69 99/60  (!) 103/53  Pulse: 79 79 78 77  Resp:      Temp:      TempSrc:      SpO2: 100% 100% 99% 100%  Weight:      Height:        Intake/Output Summary (Last 24 hours) at 10/04/2021 0742 Last data filed at 09/18/2021 0600 Gross per 24 hour  Intake 1214.34 ml  Output --  Net 1214.34 ml   Last 3 Weights 09/14/2021 09/13/2021 09/13/2021  Weight (lbs) 279 lb 15.8 oz 276 lb 10.8  oz 279 lb 8.7 oz  Weight (kg) 127 kg 125.5 kg 126.8 kg      Telemetry    SR, 1st degree AVblock, occ PVC - Personally Reviewed  ECG    No new EKGs - Personally Reviewed  Physical Exam   GEN: No acute distress.   Neck: No JVD Cardiac: RRR, no murmurs, rubs, or gallops.  Respiratory: CTA b/l (ant/lat auscultation only) GI: Soft, nontender, non-distended  MS: No edema; No deformity. Neuro:  Nonfocal  Psych: Normal affect   R groin is soft, venous sheath in place, no hematoma  Labs    High Sensitivity Troponin:   Recent Labs  Lab 09/21/2021 1425 10/03/2021 2050  TROPONINIHS 4,493* 5,596*     Chemistry Recent Labs  Lab 09/14/2021 1425 09/20/2021 1430 10/05/2021 1432 09/13/21 0632 09/14/21 0500 09/13/2021 0447  NA 132*  --    < > 134* 133* 132*  K 6.0*  --    < > 5.5* 4.9 5.2*  CL 92*  --    < > 95* 97* 97*  CO2 25  --    < > 28 27 27   GLUCOSE 425*  --    < > 305* 248* 137*  BUN 35*  --    < >  43* 31* 45*  CREATININE 8.10*  --    < > 8.69* 7.08* 8.67*  CALCIUM 9.6  --    < > 8.6* 8.4* 8.4*  MG  --  2.6*  --  2.6*  --   --   PROT 7.6  --   --   --   --   --   ALBUMIN 3.4*  --   --   --   --  2.7*  AST 37  --   --   --   --   --   ALT 15  --   --   --   --   --   ALKPHOS 62  --   --   --   --   --   BILITOT 1.6*  --   --   --   --   --   GFRNONAA 7*  --    < > 7* 8* 7*  ANIONGAP 15  --    < > 11 9 8    < > = values in this interval not displayed.    Lipids No results for input(s): CHOL, TRIG, HDL, LABVLDL, LDLCALC, CHOLHDL in the last 168 hours.  Hematology Recent Labs  Lab 09/13/21 2313 09/14/21 0500 10/07/2021 0447  WBC 9.8 9.7 8.2  RBC 3.53* 3.39* 3.06*  HGB 10.5* 10.0* 9.1*  HCT 35.6* 33.9* 30.6*  MCV 100.8* 100.0 100.0  MCH 29.7 29.5 29.7  MCHC 29.5* 29.5* 29.7*  RDW 22.4* 22.3* 22.0*  PLT 287 281 259   Thyroid No results for input(s): TSH, FREET4 in the last 168 hours.  BNP Recent Labs  Lab 10/04/2021 2020  BNP 3,842.7*    DDimer No results for  input(s): DDIMER in the last 168 hours.   Radiology      Cardiac Studies    09/14/21: TTE IMPRESSIONS   1. Technically difficult study despite use of definity contrast due to  off axis views and poor visualization of all endocardial segments. Left  ventricular ejection fraction, by estimation, is 50 to 55%. The left  ventricle has low normal function. Left  ventricular endocardial border not optimally defined to evaluate regional  wall motion. The left ventricular internal cavity size was mildly dilated.  There is mild asymmetric left ventricular hypertrophy of the basal-septal  segment. Left ventricular  diastolic parameters are consistent with Grade III diastolic dysfunction  (restrictive).   2. Right ventricular systolic function is normal. The right ventricular  size is normal.   3. Left atrial size was moderately dilated.   4. Right atrial size was mild to moderately dilated.   5. The mitral valve is degenerative. Mild mitral valve regurgitation.  Moderate to severe mitral annular calcification.   6. The aortic valve is tricuspid. There is moderate calcification of the  aortic valve. There is moderate thickening of the aortic valve. Aortic  valve regurgitation is mild. Mild aortic valve stenosis.   7. The inferior vena cava is dilated in size with <50% respiratory  variability, suggesting right atrial pressure of 15 mmHg.   8. A temporary pacing wire is visualized in the IVC and RV.   Patient Profile     55 y.o. male  with a history of ESRD on HD, COPD, AI, RBBB, OSA, DMII, persistent atrial flutter, chronic OAC on coumadin, chronic DVT, chronic diastolic CHF, h/o endocarditis in 2016, CAD (occlusion of the LAD beyond the stented segment. DES placed)  Hx of August 2022 for creation of left  arm dialysis fistula. He had a PEA arrest following the procedure felt to be due to hypercarbia. He developed atrial fib with RVR during the hospitalization and had a TEE guided  cardioversion  Pt previously seen by EP team 07/2020 for AFL with slow ventricular rate and intermittent AV block.  His rates improved to 50-60s on dopamine and he ultimately was weaned off and remained stable. He has bilateral fistulas and history of bacteremia and was felt to overall be a poor PPM candidate, though rates improved during that visit as his acute illness did, with no urgent indication for pacing.   Admitted this time with several days of R sided chest discomfort, nausea and vomiting.  Today he had 2 syncopal episodes. Called EMS and found to have HR in the 30-40s with intermittent AV block of unclear degree (at least 2nd) per report. long periods of ventricular standstill noted on admission with syncope.  Temp pacing wire placed, suspect again 2/2 acute illness/electrolyte impbalance, very poor PPM candidate  Assessment & Plan    1.  Complete heart block Clinically improved with dialysis 1:1 AV conduction has returned.   He has not had any V pacing in >24 hours Temp wire pulled this AM by Dr. Curt Bears Discussed with Dr. Curt Bears, agree with resuming amio    2. NSTEMI Likely due to acute medical illness No plans for cath at this time On Brilinta, statin no BB with bradycardia/hypotension   3. Prior DVT On heparin per primary team Warfarin outpt   4. H/o afib Currently in sinus rhythm   5. Chronic systolic dysfunction Volume managed with HD May benefit from general cardiology management as he improves   6. ESRD/ hyperkalemia He is clinically much improved with HD  7. Hypotension On midodrine out pt and here Defer to nephrology/CCM    For questions or updates, please contact [place] Please consult [email or web address] for contact info under        Signed, Baldwin Jamaica, PA-C  09/14/2021, 7:42 AM    I have seen and examined this patient with Tommye Standard.  Agree with above, note added to reflect my findings.  Currently feeling well.  No chest pain or  shortness of breath.  Remains in bed with temporary pacemaker wire.  No ventricular pacing is noted over the last 24 hours.  GEN: Well nourished, well developed, in no acute distress  HEENT: normal  Neck: no JVD, carotid bruits, or masses Cardiac: RRR; no murmurs, rubs, or gallops,no edema  Respiratory:  clear to auscultation bilaterally, normal work of breathing GI: soft, nontender, nondistended, + BS MS: no deformity or atrophy  Skin: warm and dry,  Neuro:  Strength and sensation are intact Psych: euthymic mood, full affect   1.  Complete heart block: Likely due to electrolyte abnormalities.  Heart block improved after dialysis.  Temporary wire has been pulled.  No indication for pacing at this time.  EP to sign off.  Please do not hesitate to call back if further needs arise.  Brek Reece M. Pranathi Winfree MD 10/09/2021 3:13 PM

## 2021-09-15 NOTE — Progress Notes (Signed)
Pt intubated per Anesthesia and transported to cath lab with no complications.

## 2021-09-15 NOTE — Progress Notes (Signed)
NAME:  [name] [name], MRN:  [id number], DOB:  Jul 18, 1966, LOS: 3 ADMISSION DATE:  10/05/2021, CONSULTATION DATE:  10/03/2021 REFERRING MD:  Kathrynn Humble CHIEF COMPLAINT: Syncope   History of Present Illness:  55 year old man who presented to [facility] 12/2 via EMS with nausea and vomiting, R sided CP and syncope on day of admission. PMHx significant for ESRD (on HD TTS via LUE AVF) c/b chronic hyperkalemia, advanced CHF, pulmonary hypertension, Afib, DVT (on warfarin).  On EMS evaluation, patient was bradycardic to 30s-40s prompting initiation of transcutaneous pacing (started en route). On ED arrival, arterial line was placed. Labs were notable for trop 4k, K 6.6 (iStat). Ca gluconate and 2 amps bicarb were administered. Levo was initiated. Patient remained dependent on transcutaneous pacing. Cardiology/EP and Nephrology were consulted. Patient was taken to cath lab 12/2 for TVP placement.   Pertinent Medical History:  ESRD DVT Chronic systolic and diastolic heart failure Multifactorial pulmonary hypertension CAD OSA (not using CPAP)  Significant Hospital Events: Including procedures, antibiotic start and stop dates in addition to other pertinent events   10/05/2021 arterial line placed, transcutaneous pacing, transvenous pacemaker placed in cath lab, Started on iHD 12/3 HD per TTS schedule 12/5 Temp wire pulled, AV conduction improved. L groin sheath removed.  Interim History / Subjective:  No significant events overnight Uncomfortable in bed this morning, laying supine Upset that his breakfast order is incorrect No pain this morning Breathing feels better ovrall  Objective:  Blood pressure (!) 79/29, pulse 80, temperature (!) 97 F (36.1 C), temperature source Axillary, resp. rate (!) 22, height 5\' 10"  (1.778 m), weight 127 kg, SpO2 93 %.        Intake/Output Summary (Last 24 hours) at 10/04/2021 1155 Last data filed at 09/18/2021 0700 Gross per 24 hour  Intake 942.85 ml  Output --  Net  942.85 ml    Filed Weights   09/13/21 0715 09/13/21 1140 09/14/21 0500  Weight: 126.8 kg 125.5 kg 127 kg   Physical Examination: General: Acutely ill-appearing middle-aged man in NAD. HEENT: [place]/AT, anicteric sclera, PERRL, moist mucous membranes. Neuro: Awake, oriented x 4. Responds to verbal stimuli. Following commands consistently. Moves all 4 extremities spontaneously. CV: RRR, no m/g/r. PULM: Breathing even and unlabored on [place]. Lung fields diminished bilaterally. GI: Obese, soft, nontender, nondistended. Normoactive bowel sounds. Extremities: No significant LE edema noted. Skin: Warm/dry, no rashes.  Resolved Hospital Problem List:  Toxic metabolic encephalopathy due to under-dialysis and hypotension  Assessment & Plan:   Cardiogenic shock due to complete heart block with slow escape requiring TVPM and titration of NE NSTEMI  Chronic systolic and diastolic heart failure due to ICM CAD - Goal MAP > 65 - Continue Levophed titrated to MAP goal - TVP temp wire removed today, 12/5 - Tabled EP discussion re: permanent pacemaker, as AV nodal conduction has returned - Continue Brilinta, atorvastatin - No BB in the setting of hypotension  Persistent atrial fibrillation  DVT RUL segmental PE CTA Chest 12/3 with isolated segmental PE in RUL. - Heparin gtt - Eventual transition back to PO (Warfarin as outpatient) - Continue amiodarone - Cardiac monitoring  Acute-on-chronic hypoxic respiratory failure OSA - Wean supplemental O2 for sat > 90% - Pulmonary hygiene - Mobilization as able - Refuses CPAP - PT/OT consult once sheath removed  ESRD with chronic hyperkalemia, dialysis-dependent Chronic hypotension - Nephrology following, appreciate assistance - HD per TTS schedule - LUE AVF nonfunctioning, IR consulted for fistulagram/possible declot vs. TDC placement - Trend BMP -  Lokelma/Veltassa per Nephro - Replete electrolytes as indicated - Monitor I&Os - Avoid  nephrotoxic agents as able - Ensure adequate renal perfusion - Continue midodrine TID  DM type 2  - uncontrolled  - Basal coverage - SSI PRN - CBGs Q4H  Best Practice: (right click and "Reselect all SmartList Selections" daily)   Diet/type: Regular consistency (see orders) DVT prophylaxis: systemic heparin GI prophylaxis: PPI Lines: Central line Foley:  N/A Code Status: full code Last date of multidisciplinary goals of care discussion [will attempt to reach out to family today]  Critical care time: 46 minutes   Lestine Mount, PA-C [facility] 10/03/2021 1:19 PM  Please see [email or web address] for pager details.  From 7A-7P if no response, please call [phone] After hours, please call ELink [phone]

## 2021-09-15 NOTE — Progress Notes (Signed)
Pt receives out-pt HD at [place] on TTS. Pt arrives around 9:25 for 9:45 chair time. Pt uses transportation to/from HD. Will assist as needed.  Melven Sartorius Renal Navigator [phone]

## 2021-09-15 NOTE — Anesthesia Procedure Notes (Signed)
Procedure Name: Intubation Date/Time: 10/03/2021 11:05 PM Performed by: Suzy Bouchard, CRNA Pre-anesthesia Checklist: Patient identified, Emergency Drugs available, Suction available, Patient being monitored and Timeout performed Patient Re-evaluated:Patient Re-evaluated prior to induction Oxygen Delivery Method: Ambu bag Preoxygenation: Pre-oxygenation with 100% oxygen Induction Type: IV induction, Rapid sequence and Cricoid Pressure applied Laryngoscope Size: Glidescope and 3 Grade View: Grade I Tube type: Oral Tube size: 7.5 mm Number of attempts: 1 Airway Equipment and Method: Stylet and Video-laryngoscopy Placement Confirmation: ETT inserted through vocal cords under direct vision, positive ETCO2 and breath sounds checked- equal and bilateral Secured at: 24 cm Tube secured with: Tape Dental Injury: Teeth and Oropharynx as per pre-operative assessment

## 2021-09-15 NOTE — Interval H&P Note (Signed)
History and Physical Interval Note:  09/23/2021 11:08 PM  [name] [name]  has presented today for surgery, with the diagnosis of chb.  The various methods of treatment have been discussed with the patient and family. After consideration of risks, benefits and other options for treatment, the patient has consented to  Procedure(s): TEMPORARY PACEMAKER (N/A) as a surgical intervention.  The patient's history has been reviewed, patient examined, no change in status, stable for surgery.  I have reviewed the patient's chart and labs.  Questions were answered to the patient's satisfaction.     Early Osmond

## 2021-09-15 NOTE — Progress Notes (Signed)
ANTICOAGULATION CONSULT NOTE   Pharmacy Consult:  Heparin Indication: atrial fibrillation  Allergies  Allergen Reactions   Ace Inhibitors Cough   Pork-Derived Products Other (See Comments)    Entry determined to be clinically insignificant: OK TO GIVE HEPARIN (has tolerated well in the past)  Patient had allergic episode in 1989 after he ate pork at a fair. He developed fever and rash and went to the ED, but he did not have breathing issues. Received heparin in the past. Patient ok with removing pork allergy and listing as intolerance instead.     Patient Measurements: Height: 5\' 10"  (177.8 cm) Weight: 127 kg (279 lb 15.8 oz) IBW/kg (Calculated) : 73 Heparin Dosing Weight: 104 kg  Vital Signs: Temp: 98.1 F (36.7 C) (12/05 0400) Temp Source: Oral (12/05 0400) BP: 103/53 (12/05 0645) Pulse Rate: 77 (12/05 0645)  Labs: Recent Labs    10/08/2021 1425 10/10/2021 1432 09/13/2021 2050 09/13/21 0600 09/13/21 0632 09/13/21 1531 09/13/21 2313 09/14/21 0041 09/14/21 0500 09/14/21 1000 09/14/2021 0447  HGB 13.1   < >  --   --  11.6*  --  10.5*  --  10.0*  --  9.1*  HCT 43.2   < >  --   --  38.0*  --  35.6*  --  33.9*  --  30.6*  PLT 266  --   --   --  301  --  287  --  281  --  259  LABPROT 18.3*  --   --  21.3*  --   --   --   --   --   --   --   INR 1.5*  --   --  1.9*  --   --   --   --   --   --   --   HEPARINUNFRC  --   --   --  <0.10*  --    < >  --  <0.10*  --  0.27* 0.41  CREATININE 8.10*   < > 8.48*  --  8.69*  --   --   --  7.08*  --  8.67*  TROPONINIHS 4,493*  --  5,596*  --   --   --   --   --   --   --   --    < > = values in this interval not displayed.     Estimated Creatinine Clearance: 12.6 mL/min (A) (by C-G formula based on SCr of 8.67 mg/dL (H)).   Assessment: 23 YOM presented with nausea and vomiting for 2 days and syncope.  Patient was hypotensive and bradycardic, requiring transcutaneous pacing.  S/p cath with temporary transvenous pacemaker insertion.   Pharmacy consulted to start IV heparin while Coumadin is on hold.  INR is sub-therapeutic on admit.  Pt noted to have PE in CT chest yesterday. Heparin level came back therapeutic at 0.41, on 2150 units/hr. Hgb 9.1, plt 259. No s/sx of bleeding or infusion issues.   Goal of Therapy:  Heparin level 0.3-0.7 units/ml Monitor platelets by anticoagulation protocol: Yes   Plan:  Continue IV heparin at 2150 units/hr. Daily heparin level and CBC.  Antonietta Jewel, PharmD, [facility] Clinical Pharmacist  Phone: [phone] 10/10/2021 7:35 AM  Please check AMION for all [facility] phone numbers After 10:00 PM, call [facility] [phone]

## 2021-09-15 NOTE — Progress Notes (Signed)
[facility] Progress Note  Subjective:  NE weened to 58mcg/min EP notes reviewed, no further bradycardia; temp pace rremoved LUE AVG used 12/1, now clotted w/o B/T Chronic hypotension on standing midodrine as outpatient   Vitals:   09/21/2021 0645 10/06/2021 0700 09/16/2021 0730 09/14/2021 0804  BP: (!) 103/53 (!) 108/41 (!) 109/57   Pulse: 77 83 77   Resp:   17   Temp:    97.9 F (36.6 C)  TempSrc:    Oral  SpO2: 100% 99% 93%   Weight:      Height:        Exam:   alert, nad, obese , lying flat    no jvd  Chest cta bilat  Cor reg no RG  Abd soft ntnd no ascites   Ext no LE edema   Alert, NF, ox3    LUA AVF no bruit, temp HD cath R groin     OP HD: MW TTS   130kg  400/500  2/2 bath  LUE AVF    - calcitriol 2.25 ug tiw  - iron 50mg  weekly  - auryxia/ renvela binders  - lokelma 8.4 on non HD days     CXR 12/03 - vasc congestion, IS edema mild   CT 12/03 angio chest - diffuse GG changes, small lung volumes   Assessment/ Plan: Unstable bradycardia - temp venous pacemaker removed 12/5.  Driven by hyperkalemia, improved; HD 12/3, on schedule.  Chronic hypotension, weening NE Gtt, cont midodrine 10 TID. ESRD: as above, THS.  Clotted LUE AVG, has R Fem Temp HD cath at this time.   Clotted AVG - in LUE. Consult IR this AM for declot and/or TDC.   Acute on chronic resp failure - w/ IS edema by CXR, prob edema on CT as well (diffuse GG changes). Stable, UF as able, probe EDW down. NSTEMI - [facility] Fleet, per cardiology Volume - no edema of extremities, + edema by CXR.  3 kg under dry wt here. May be losing body wt.  As above Chronic syst/ diast CHF CAD  Atrial fib - per EP Acute encephalopathy - improved w/ dialysis Anemia ckd - cont Fe weekly , no esa needed (Hb 11-12)  MBD ckd - resume binders when eating, Ca wnl, phos 7 Chronic hyperkalemia: outpt [place], cont   [name], MD  10/10/2021, 9:15 AM   Recent Labs  Lab 09/13/21 0632 09/13/21 2313  09/14/21 0500 09/14/2021 0447  K 5.5*  --  4.9 5.2*  BUN 43*  --  31* 45*  CREATININE 8.69*  --  7.08* 8.67*  CALCIUM 8.6*  --  8.4* 8.4*  PHOS 7.1*  --   --  7.0*  HGB 11.6*   < > 10.0* 9.1*   < > = values in this interval not displayed.    Inpatient medications:  amiodarone  200 mg Oral Daily   aspirin  81 mg Oral Daily   atorvastatin  80 mg Oral Daily   Chlorhexidine Gluconate Cloth  6 each Topical Q0600   cinacalcet  30 mg Oral Q breakfast   clonazePAM  0.5 mg Oral BID   ferric citrate  420 mg Oral With snacks   ferric citrate  630 mg Oral TID WC   insulin aspart  0-20 Units Subcutaneous TID WC   insulin aspart  4 Units Subcutaneous TID WC   insulin glargine-yfgn  70 Units Subcutaneous QHS   midodrine  10 mg Oral TID WC   mupirocin ointment  1 application Topical TID   ondansetron (ZOFRAN) IV  4 mg Intravenous Once   pantoprazole  40 mg Oral Daily   patiromer  8.4 g Oral Q24H   rOPINIRole  0.25 mg Oral QHS   sevelamer carbonate  1,600 mg Oral With snacks   sevelamer carbonate  3,200 mg Oral TID WC   ticagrelor  90 mg Oral BID    sodium chloride Stopped (09/13/21 1550)   sodium chloride     heparin 2,150 Units/hr (09/11/2021 0700)   norepinephrine (LEVOPHED) Adult infusion 4 mcg/min (10/02/2021 0700)   sodium chloride, sodium chloride, alteplase, docusate sodium, heparin, lidocaine (PF), lidocaine-prilocaine, meclizine, nitroGLYCERIN, ondansetron, pentafluoroprop-tetrafluoroeth, polyethylene glycol, sodium chloride flush

## 2021-09-15 NOTE — Progress Notes (Signed)
Left femoral venous sheath d/c'd without difficulty or excess bleeding.  Hemostasis obtained about 10 minutes manual pressure applied.

## 2021-09-15 NOTE — Progress Notes (Signed)
eLink Physician-Brief Progress Note Patient Name: [name] [name] DOB: 07/19/1966 MRN: [id number]   Date of Service  09/30/2021  HPI/Events of Note  Patient with severe bradycardia  / pauses after having trans-venous pacemaker pulled earlier today, externally paced at this time.  eICU Interventions  PCCM ground crew requested to go to the room stat, Cardiology paged stat as patient may require re-insertion of temporary trans-venous pacemaker, arterial line ordered to enable reliable BP measurements.        [name] [name] 09/11/2021, 10:10 PM

## 2021-09-15 NOTE — Consult Note (Signed)
Chief Complaint: Concern for clotted graft. Request is for fistulogram with possible intervention - Left upper extremity brachial artery to axillary vein AV graft   Referring Physician(s): Dr. Alfonso Ellis  Supervising Physician: Ruthann Cancer  Patient Status: [facility] - In-pt  History of Present Illness: [name] [name] is a 55 y.o. male History of DM, HTN,chronic  DVT, failed transplant.  Admitted for chest discomfort. S/p temp cardiac catheter placed on 12.2.22. Per Nephrology notes they are concerned it has clotted . No bruit or thrill.  It is a LUE AVG. He currently has a right femoral temp catheter placed by critical care on 12.3.22. Looks like he has a left upper extremity brachial artery to axillary vein AV graft placed on 8.16.22. Per care everywhere  the AVG was thrombosis and excised at OSH 10.3.22. Per notes the patient went into cardiac arrest during the procedure. Team is requesting a fistulogram possible intervention and/ or tunneled line hemodialysis catheter placement.  Currently without any significant complaints. Patient sleepy but alert and laying in bed. Endorses abdominal pain.  Denies any fevers, headache, chest pain, SOB, cough, nausea, vomiting or bleeding. Return precautions and treatment recommendations and follow-up discussed with the patient who is agreeable with the plan.   Past Medical History:  Diagnosis Date   Renal disorder     Past Surgical History:  Procedure Laterality Date   TEMPORARY PACEMAKER N/A 09/28/2021   Procedure: TEMPORARY PACEMAKER;  Surgeon: Sherren Mocha, MD;  Location: [facility];  Service: Cardiovascular;  Laterality: N/A;    Allergies: Ace inhibitors and Pork-derived products  Medications: Prior to Admission medications   Medication Sig Start Date End Date Taking? Authorizing Provider  amiodarone (PACERONE) 200 MG tablet Take 200 mg by mouth daily. 08/30/21  Yes [provider]  atorvastatin (LIPITOR) 80 MG tablet  Take 80 mg by mouth daily. 08/18/21  Yes [provider]  cinacalcet (SENSIPAR) 30 MG tablet Take 30 mg by mouth daily.   Yes [provider]  clonazePAM (KLONOPIN) 0.5 MG tablet Take 0.5 mg by mouth 3 (three) times daily as needed for anxiety. 09/01/21  Yes [provider]  ferric citrate (AURYXIA) 1 GM 210 MG(Fe) tablet Take 420-630 mg by mouth See admin instructions. Take 3 tablets (630 mg) by mouth three times daily with meals and 2 tablets (420 mg) with snacks   Yes [provider]  insulin glargine, 2 Unit Dial, (TOUJEO MAX SOLOSTAR) 300 UNIT/ML Solostar Pen Inject 80 Units into the skin 2 (two) times daily.   Yes [provider]  insulin regular human CONCENTRATED (HUMULIN R U-500 KWIKPEN) 500 UNIT/ML KwikPen Inject 50-70 Units into the skin See admin instructions. Inject 60 units subcutaneously with breakfast, 70 units with lunch and 50 units with supper   Yes [provider]  meclizine (ANTIVERT) 12.5 MG tablet Take 12.5 mg by mouth every morning. 09/13/21  Yes [provider]  midodrine (PROAMATINE) 5 MG tablet Take 5 mg by mouth See admin instructions. Take 1 tablet (5 mg) by mouth three times daily, may also take 2 tablets (10 mg) during dialysis - Tuesday, Thursday, Saturday - as needed for low blood pressure 09/07/21  Yes [provider]  mupirocin ointment (BACTROBAN) 2 % Apply 1 application topically 3 (three) times daily as needed (wound care). 04/28/21  Yes [provider]  nitroGLYCERIN (NITROSTAT) 0.4 MG SL tablet Place 0.4 mg under the tongue every 5 (five) minutes as needed for chest pain. 06/04/21  Yes  [provider]  pantoprazole (PROTONIX) 40 MG tablet Take 40 mg by mouth at bedtime. 07/21/21  Yes [provider]  patiromer (VELTASSA) 8.4 g packet Take 8.4 g by mouth daily.   Yes [provider]  rOPINIRole (REQUIP) 0.25 MG tablet Take 0.25 mg by mouth at bedtime. 09/10/21   Yes [provider]  sevelamer carbonate (RENVELA) 800 MG tablet Take 1,600-3,200 mg by mouth See admin instructions. Take 4 tablets (3200 mg) by mouth three times daily with meals, take 2 tablets (1600 mg) with snacks   Yes [provider]  silver sulfADIAZINE (SILVADENE) 1 % cream Apply 1 application topically daily as needed (wound care).   Yes [provider]  ticagrelor (BRILINTA) 90 MG TABS tablet Take by mouth 2 (two) times daily.   Yes [provider]  warfarin (COUMADIN) 5 MG tablet Take 2.5 mg by mouth See admin instructions. Per anti-coag visit 10/05/2021: take 1/2 tablet (2.5 mg) by mouth on Monday, Wednesday, Friday; take 1 tablet (5 mg) on Sunday, Tuesday, Thursday, Saturday. 07/09/21  Yes [provider]     History reviewed. No pertinent family history.  Social History   Socioeconomic History   Marital status: Divorced    Spouse name: Not on file   Number of children: Not on file   Years of education: Not on file   Highest education level: Not on file  Occupational History   Not on file  Tobacco Use   Smoking status: Not on file   Smokeless tobacco: Not on file  Substance and Sexual Activity   Alcohol use: Not on file   Drug use: Not on file   Sexual activity: Not on file  Other Topics Concern   Not on file  Social History Narrative   Not on file   Social Determinants of Health   Financial Resource Strain: Not on file  Food Insecurity: Not on file  Transportation Needs: Not on file  Physical Activity: Not on file  Stress: Not on file  Social Connections: Not on file    Review of Systems: A 12 point ROS discussed and pertinent positives are indicated in the HPI above.  All other systems are negative.  Review of Systems  Constitutional:  Negative for fever.  HENT:  Negative for congestion.   Respiratory:  Negative for cough and shortness of breath.   Cardiovascular:  Negative for chest pain.  Gastrointestinal:   Positive for abdominal pain.  Neurological:  Negative for headaches.  Psychiatric/Behavioral:  Negative for behavioral problems and confusion.    Vital Signs: BP (!) 64/21   Pulse 79   Temp (!) 97 F (36.1 C) (Axillary)   Resp (!) 23   Ht 5\' 10"  (1.778 m)   Wt 279 lb 15.8 oz (127 kg)   SpO2 99%   BMI 40.17 kg/m   Physical Exam Vitals and nursing note reviewed.  Constitutional:      Appearance: He is well-developed. He is obese.  HENT:     Head: Normocephalic.  Cardiovascular:     Rate and Rhythm: Normal rate. Rhythm irregular.     Heart sounds: Normal heart sounds.  Pulmonary:     Effort: Pulmonary effort is normal.  Musculoskeletal:        General: Normal range of motion.     Cervical back: Normal range of motion.  Skin:    General: Skin is dry.  Neurological:     Mental Status: He is alert and oriented to person,  place, and time.    Imaging: CT Angio Chest PE W and/or Wo Contrast  Addendum Date: 09/13/2021   ADDENDUM REPORT: 09/13/2021 00:45 ADDENDUM: Critical Value/emergent results were called by telephone at the time of interpretation on 09/13/2021 at 12:45 am to provider Dr. Lucile Shutters, who verbally acknowledged these results. Electronically Signed   By: Julian Hy M.D.   On: 09/13/2021 00:45   Result Date: 09/13/2021 CLINICAL DATA:  PE suspected, recent pacemaker placement EXAM: CT ANGIOGRAPHY CHEST WITH CONTRAST TECHNIQUE: Multidetector CT imaging of the chest was performed using the standard protocol during bolus administration of intravenous contrast. Multiplanar CT image reconstructions and MIPs were obtained to evaluate the vascular anatomy. CONTRAST:  64mL OMNIPAQUE IOHEXOL 350 MG/ML SOLN COMPARISON:  Chest radiograph dated 09/20/2021 FINDINGS: Cardiovascular: Isolated segmental pulmonary embolism in the right upper lobe (series 8/image 77). Enlargement of the main pulmonary artery, suggesting pulmonary arterial hypertension. Cardiomegaly.  No pericardial  effusion. No evidence of thoracic aortic aneurysm. Atherosclerotic calcifications of the aortic arch. Three vessel coronary atherosclerosis. Mediastinum/Nodes: Small mediastinal lymph nodes, including a 2.0 cm short axis subcarinal node, likely reactive. Visualized thyroid is unremarkable. Lungs/Pleura: Evaluation of the lung parenchyma is constrained by respiratory motion. Within that constraint, there is interlobular septal thickening with ground-glass opacity, favoring mild interstitial edema. Superimposed mild patchy opacity in the posterior right upper lobe (series 7/image 31), possibly reflecting mild infection/pneumonia, although favoring atelectasis. Small right pleural effusion.  No pneumothorax. Upper Abdomen: Visualized upper abdomen is grossly unremarkable, noting vascular calcifications. Musculoskeletal: Visualized osseous structures are within normal limits. Review of the MIP images confirms the above findings. IMPRESSION: Isolated segmental pulmonary embolism in the right upper lobe. Cardiomegaly with mild interstitial edema and small right pleural effusion. Superimposed mild patchy opacity in the posterior right upper lobe, favoring atelectasis, although mild infection/pneumonia is not excluded. Aortic Atherosclerosis (ICD10-I70.0). Electronically Signed: By: Julian Hy M.D. On: 09/13/2021 00:25   CARDIAC CATHETERIZATION  Result Date: 10/10/2021 Successful temporary transvenous pacemaker insertion via left femoral venous access, pacing threshold less than 0.8 mA Recommend: Pacemaker set at 80 bpm, output 10 mA.  Strict bedrest while temporary pacemaker in place.  EP team to follow.   DG CHEST PORT 1 VIEW  Result Date: 09/14/2021 CLINICAL DATA:  Dyspnea EXAM: PORTABLE CHEST 1 VIEW COMPARISON:  09/13/2021 FINDINGS: Lungs are clear. No pneumothorax or pleural effusion. Cardiomegaly is stable. Inferiorly approaching trans venous pacemaker lead is unchanged overlying the expected right  ventricle. Pulmonary vascularity is stable with trace perihilar pulmonary edema again noted. No acute bone abnormality. Vascular stent noted within the right axilla. IMPRESSION: Stable cardiomegaly and trace perihilar pulmonary edema. Electronically Signed   By: Fidela Salisbury M.D.   On: 09/14/2021 20:36   DG Chest Port 1 View  Result Date: 09/13/2021 CLINICAL DATA:  55 year old male central line placement. Unsuccessful right IJ dialysis catheter placement. Subsequent right femoral venous catheter placement. EXAM: PORTABLE CHEST 1 VIEW COMPARISON:  CTA chest 0003 hours today. FINDINGS: Portable AP semi upright view at 0543 hours. An inferior approach thin caliber central line extends through the inferior cavoatrial junction and projects over the right heart. This was subtle but present on the earlier CTA. Stable cardiomegaly and mediastinal contours. Visualized tracheal air column is within normal limits. No pneumothorax. Right axillary vascular stent. Small right pleural effusion better demonstrated by CTA. Continued pulmonary vascular congestion. No consolidation. IMPRESSION: 1. Inferior approach central line tracks through the inferior cavoatrial junction and loops over the right heart. This was  subtle but present on the earlier CTA. 2. No pneumothorax. 3. Stable cardiomegaly, pulmonary vascular congestion/mild edema. Electronically Signed   By: Genevie Ann M.D.   On: 09/13/2021 07:21   DG Chest Port 1 View  Result Date: 09/11/2021 CLINICAL DATA:  Altered mental status EXAM: PORTABLE CHEST 1 VIEW COMPARISON:  Chest x-ray 02/16/2021 FINDINGS: Heart is enlarged. Mediastinum appears grossly stable. Pulmonary vascular prominence. No focal consolidation visualized. No significant pleural effusion. No pneumothorax. Somewhat limited due to body habitus and portable technique. IMPRESSION: Cardiomegaly with pulmonary vascular congestion. Electronically Signed   By: Ofilia Neas M.D.   On: 10/08/2021 14:51    ECHOCARDIOGRAM COMPLETE  Result Date: 09/14/2021    ECHOCARDIOGRAM REPORT   Patient Name:   [name] [name] Date of Exam: 09/14/2021 Medical Rec #:  [id number]      Height:       70.0 in Accession #:    [id number]     Weight:       280.0 lb Date of Birth:  1966/07/22      BSA:          2.408 m Patient Age:    38 years       BP:           115/65 mmHg Patient Gender: M              HR:           80 bpm. Exam Location:  Inpatient Procedure: 2D Echo, Color Doppler, Cardiac Doppler and Intracardiac            Opacification Agent Indications:    cardiac arrest  History:        Patient has no prior history of Echocardiogram examinations.                 Risk Factors:Hypertension.  Sonographer:    Melissa Morford RDCS (AE, PE) Referring Phys: [facility]  1. Technically difficult study despite use of definity contrast due to off axis views and poor visualization of all endocardial segments. Left ventricular ejection fraction, by estimation, is 50 to 55%. The left ventricle has low normal function. Left ventricular endocardial border not optimally defined to evaluate regional wall motion. The left ventricular internal cavity size was mildly dilated. There is mild asymmetric left ventricular hypertrophy of the basal-septal segment. Left ventricular diastolic parameters are consistent with Grade III diastolic dysfunction (restrictive).  2. Right ventricular systolic function is normal. The right ventricular size is normal.  3. Left atrial size was moderately dilated.  4. Right atrial size was mild to moderately dilated.  5. The mitral valve is degenerative. Mild mitral valve regurgitation. Moderate to severe mitral annular calcification.  6. The aortic valve is tricuspid. There is moderate calcification of the aortic valve. There is moderate thickening of the aortic valve. Aortic valve regurgitation is mild. Mild aortic valve stenosis.  7. The inferior vena cava is dilated in size with <50% respiratory  variability, suggesting right atrial pressure of 15 mmHg.  8. A temporary pacing wire is visualized in the IVC and RV. Comparison(s): No prior Echocardiogram. FINDINGS  Left Ventricle: Technically difficult study despite use of definity contrast due to off axis views and poor visualization of all endocardial segments. Left ventricular ejection fraction, by estimation, is 50 to 55%. The left ventricle has low normal function. Left ventricular endocardial border not optimally defined to evaluate regional wall motion. Definity contrast agent was given IV to delineate the left ventricular endocardial  borders. The left ventricular internal cavity size was mildly dilated. There is mild asymmetric left ventricular hypertrophy of the basal-septal segment. Abnormal (paradoxical) septal motion, consistent with RV pacemaker. Left ventricular diastolic parameters are consistent with Grade III diastolic dysfunction (restrictive). Right Ventricle: The right ventricular size is normal. Right vetricular wall thickness was not well visualized. Right ventricular systolic function is normal. Left Atrium: Left atrial size was moderately dilated. Right Atrium: Right atrial size was mild to moderately dilated. Pericardium: There is no evidence of pericardial effusion. Mitral Valve: The mitral valve is degenerative in appearance. There is moderate thickening of the mitral valve leaflet(s). There is moderate calcification of the mitral valve leaflet(s). Moderate to severe mitral annular calcification. Mild mitral valve regurgitation. MV peak gradient, 10.4 mmHg. The mean mitral valve gradient is 4.0 mmHg. Tricuspid Valve: The tricuspid valve is normal in structure. Tricuspid valve regurgitation is trivial. Aortic Valve: The aortic valve is tricuspid. There is moderate calcification of the aortic valve. There is moderate thickening of the aortic valve. Aortic valve regurgitation is mild. Mild aortic stenosis is present. Aortic valve mean  gradient measures 8.5 mmHg. Aortic valve peak gradient measures 15.7 mmHg. Aortic valve area, by VTI measures 2.28 cm. Pulmonic Valve: The pulmonic valve was normal in structure. Pulmonic valve regurgitation is mild. Aorta: The aortic root is normal in size and structure. Venous: Temporary pacing wire seen in IVC. The inferior vena cava is dilated in size with less than 50% respiratory variability, suggesting right atrial pressure of 15 mmHg. IAS/Shunts: There is right bowing of the interatrial septum, suggestive of elevated left atrial pressure. No atrial level shunt detected by color flow Doppler. Additional Comments: A device lead is visualized.  LEFT VENTRICLE PLAX 2D LVIDd:         5.90 cm LVIDs:         4.70 cm LV PW:         1.10 cm LV IVS:        1.00 cm LVOT diam:     2.25 cm LV SV:         70 LV SV Index:   29 LVOT Area:     3.98 cm  RIGHT VENTRICLE TAPSE (M-mode): 1.2 cm LEFT ATRIUM              Index        RIGHT ATRIUM           Index LA diam:        5.50 cm  2.28 cm/m   RA Area:     28.80 cm LA Vol (A2C):   101.0 ml 41.94 ml/m  RA Volume:   93.80 ml  38.95 ml/m LA Vol (A4C):   118.0 ml 49.00 ml/m LA Biplane Vol: 116.0 ml 48.17 ml/m  AORTIC VALVE AV Area (Vmax):    2.15 cm AV Area (Vmean):   2.06 cm AV Area (VTI):     2.28 cm AV Vmax:           198.00 cm/s AV Vmean:          138.000 cm/s AV VTI:            0.308 m AV Peak Grad:      15.7 mmHg AV Mean Grad:      8.5 mmHg LVOT Vmax:         107.00 cm/s LVOT Vmean:        71.467 cm/s LVOT VTI:  0.176 m LVOT/AV VTI ratio: 0.57  AORTA Ao Root diam: 3.50 cm MITRAL VALVE                TRICUSPID VALVE MV Area (PHT): 5.50 cm     TR Peak grad:   43.8 mmHg MV Area VTI:   2.39 cm     TR Vmax:        331.00 cm/s MV Peak grad:  10.4 mmHg MV Mean grad:  4.0 mmHg     SHUNTS MV Vmax:       1.61 m/s     Systemic VTI:  0.18 m MV Vmean:      90.5 cm/s    Systemic Diam: 2.25 cm MV Decel Time: 138 msec MV E velocity: 158.00 cm/s MV A velocity: 66.60  cm/s MV E/A ratio:  2.37 Gwyndolyn Kaufman MD Electronically signed by Gwyndolyn Kaufman MD Signature Date/Time: 09/14/2021/1:08:15 PM    Final     Labs:  CBC: Recent Labs    09/13/21 0632 09/13/21 2313 09/14/21 0500 09/30/2021 0447  WBC 11.9* 9.8 9.7 8.2  HGB 11.6* 10.5* 10.0* 9.1*  HCT 38.0* 35.6* 33.9* 30.6*  PLT 301 287 281 259    COAGS: Recent Labs    09/13/2021 1425 09/13/21 0600  INR 1.5* 1.9*    BMP: Recent Labs    09/17/2021 2050 09/13/21 0632 09/14/21 0500 09/11/2021 0447  NA 131* 134* 133* 132*  K 6.5* 5.5* 4.9 5.2*  CL 90* 95* 97* 97*  CO2 26 28 27 27   GLUCOSE 472* 305* 248* 137*  BUN 41* 43* 31* 45*  CALCIUM 9.2 8.6* 8.4* 8.4*  CREATININE 8.48* 8.69* 7.08* 8.67*  GFRNONAA 7* 7* 8* 7*    LIVER FUNCTION TESTS: Recent Labs    09/18/2021 1425 10/07/2021 0447  BILITOT 1.6*  --   AST 37  --   ALT 15  --   ALKPHOS 62  --   PROT 7.6  --   ALBUMIN 3.4* 2.7*     Assessment and Plan:  55 y.o. male inpatient. History of DM, HTN,chronic  DVT, failed transplant.  Admitted for chest discomfort. S/p temp cardiac catheter placed on 12.2.22. Per Nephrology notes they are concerned it has clotted . No bruit or thrill.  It is a LUE AVG. He currently has a right femoral temp catheter placed by critical care on 12.3.22. Looks like he has a left upper extremity brachial artery to axillary vein AV graft placed on 8.16.22. Per care everywhere  the AVG was thrombosis and excised at OSH 10.3.22. Per notes the patient went into cardiac arrest during the procedure. Team is requesting a fistulogram possible intervention and/ or tunneled line hemodialysis catheter placement.  No pertinent imaging.  Potassium 5.2, BUN 45, Cr 6.67, albumin 2.7. Patient is on a heparin gtt. All other labs and medications are within acceptable parameters. No pertinent allergies. Patient has been NPO since midnight.  Risks and benefits discussed with the patient including, but not limited to bleeding,  infection, vascular injury, pulmonary embolism, need for tunneled HD catheter placement or even death.  All of the patient's questions were answered, patient is agreeable to proceed. Consent signed and in chart.   Thank you for this interesting consult.  I greatly enjoyed meeting [name] [name] and look forward to participating in their care.  A copy of this report was sent to the requesting provider on this date.  Electronically Signed: Jacqualine Mau, NP 10/03/2021, 1:32 PM   I spent a  total of 40 Minutes    in face to face in clinical consultation, greater than 50% of which was counseling/coordinating care for fistulogram possible intervention.

## 2021-09-15 NOTE — Progress Notes (Addendum)
Progress Note  Patient Name: [name] [name] Date of Encounter: 09/30/2021  [facility] Cardiologist: None Dr. Julianne Handler  Subjective   "I'm doing OK"  Inpatient Medications    Scheduled Meds:  amiodarone  200 mg Oral Daily   aspirin  81 mg Oral Daily   atorvastatin  80 mg Oral Daily   Chlorhexidine Gluconate Cloth  6 each Topical Q0600   cinacalcet  30 mg Oral Q breakfast   clonazePAM  0.5 mg Oral BID   ferric citrate  420 mg Oral With snacks   ferric citrate  630 mg Oral TID WC   insulin aspart  0-20 Units Subcutaneous TID WC   insulin aspart  4 Units Subcutaneous TID WC   insulin glargine-yfgn  70 Units Subcutaneous QHS   midodrine  10 mg Oral TID WC   mupirocin ointment  1 application Topical TID   ondansetron (ZOFRAN) IV  4 mg Intravenous Once   pantoprazole  40 mg Oral Daily   patiromer  8.4 g Oral Q24H   rOPINIRole  0.25 mg Oral QHS   sevelamer carbonate  1,600 mg Oral With snacks   sevelamer carbonate  3,200 mg Oral TID WC   ticagrelor  90 mg Oral BID   Continuous Infusions:  sodium chloride Stopped (09/13/21 1550)   sodium chloride     heparin 2,150 Units/hr (09/25/2021 0600)   norepinephrine (LEVOPHED) Adult infusion 4 mcg/min (10/07/2021 0600)   PRN Meds: sodium chloride, sodium chloride, alteplase, docusate sodium, heparin, lidocaine (PF), lidocaine-prilocaine, meclizine, nitroGLYCERIN, ondansetron, pentafluoroprop-tetrafluoroeth, polyethylene glycol, sodium chloride flush   Vital Signs    Vitals:   10/02/2021 0600 09/17/2021 0615 10/11/2021 0630 09/28/2021 0645  BP: (!) 94/54 98/69 99/60  (!) 103/53  Pulse: 79 79 78 77  Resp:      Temp:      TempSrc:      SpO2: 100% 100% 99% 100%  Weight:      Height:        Intake/Output Summary (Last 24 hours) at 09/27/2021 0742 Last data filed at 09/14/2021 0600 Gross per 24 hour  Intake 1214.34 ml  Output --  Net 1214.34 ml   Last 3 Weights 09/14/2021 09/13/2021 09/13/2021  Weight (lbs) 279 lb 15.8 oz 276 lb 10.8  oz 279 lb 8.7 oz  Weight (kg) 127 kg 125.5 kg 126.8 kg      Telemetry    SR, 1st degree AVblock, occ PVC - Personally Reviewed  ECG    No new EKGs - Personally Reviewed  Physical Exam   GEN: No acute distress.   Neck: No JVD Cardiac: RRR, no murmurs, rubs, or gallops.  Respiratory: CTA b/l (ant/lat auscultation only) GI: Soft, nontender, non-distended  MS: No edema; No deformity. Neuro:  Nonfocal  Psych: Normal affect   R groin is soft, venous sheath in place, no hematoma  Labs    High Sensitivity Troponin:   Recent Labs  Lab 09/19/2021 1425 09/29/2021 2050  TROPONINIHS 4,493* 5,596*     Chemistry Recent Labs  Lab 09/14/2021 1425 09/19/2021 1430 10/03/2021 1432 09/13/21 0632 09/14/21 0500 09/30/2021 0447  NA 132*  --    < > 134* 133* 132*  K 6.0*  --    < > 5.5* 4.9 5.2*  CL 92*  --    < > 95* 97* 97*  CO2 25  --    < > 28 27 27   GLUCOSE 425*  --    < > 305* 248* 137*  BUN 35*  --    < >  43* 31* 45*  CREATININE 8.10*  --    < > 8.69* 7.08* 8.67*  CALCIUM 9.6  --    < > 8.6* 8.4* 8.4*  MG  --  2.6*  --  2.6*  --   --   PROT 7.6  --   --   --   --   --   ALBUMIN 3.4*  --   --   --   --  2.7*  AST 37  --   --   --   --   --   ALT 15  --   --   --   --   --   ALKPHOS 62  --   --   --   --   --   BILITOT 1.6*  --   --   --   --   --   GFRNONAA 7*  --    < > 7* 8* 7*  ANIONGAP 15  --    < > 11 9 8    < > = values in this interval not displayed.    Lipids No results for input(s): CHOL, TRIG, HDL, LABVLDL, LDLCALC, CHOLHDL in the last 168 hours.  Hematology Recent Labs  Lab 09/13/21 2313 09/14/21 0500 09/17/2021 0447  WBC 9.8 9.7 8.2  RBC 3.53* 3.39* 3.06*  HGB 10.5* 10.0* 9.1*  HCT 35.6* 33.9* 30.6*  MCV 100.8* 100.0 100.0  MCH 29.7 29.5 29.7  MCHC 29.5* 29.5* 29.7*  RDW 22.4* 22.3* 22.0*  PLT 287 281 259   Thyroid No results for input(s): TSH, FREET4 in the last 168 hours.  BNP Recent Labs  Lab 09/20/2021 2020  BNP 3,842.7*    DDimer No results for  input(s): DDIMER in the last 168 hours.   Radiology      Cardiac Studies    09/14/21: TTE IMPRESSIONS   1. Technically difficult study despite use of definity contrast due to  off axis views and poor visualization of all endocardial segments. Left  ventricular ejection fraction, by estimation, is 50 to 55%. The left  ventricle has low normal function. Left  ventricular endocardial border not optimally defined to evaluate regional  wall motion. The left ventricular internal cavity size was mildly dilated.  There is mild asymmetric left ventricular hypertrophy of the basal-septal  segment. Left ventricular  diastolic parameters are consistent with Grade III diastolic dysfunction  (restrictive).   2. Right ventricular systolic function is normal. The right ventricular  size is normal.   3. Left atrial size was moderately dilated.   4. Right atrial size was mild to moderately dilated.   5. The mitral valve is degenerative. Mild mitral valve regurgitation.  Moderate to severe mitral annular calcification.   6. The aortic valve is tricuspid. There is moderate calcification of the  aortic valve. There is moderate thickening of the aortic valve. Aortic  valve regurgitation is mild. Mild aortic valve stenosis.   7. The inferior vena cava is dilated in size with <50% respiratory  variability, suggesting right atrial pressure of 15 mmHg.   8. A temporary pacing wire is visualized in the IVC and RV.   Patient Profile     55 y.o. male  with a history of ESRD on HD, COPD, AI, RBBB, OSA, DMII, persistent atrial flutter, chronic OAC on coumadin, chronic DVT, chronic diastolic CHF, h/o endocarditis in 2016, CAD (occlusion of the LAD beyond the stented segment. DES placed)  Hx of August 2022 for creation of left  arm dialysis fistula. He had a PEA arrest following the procedure felt to be due to hypercarbia. He developed atrial fib with RVR during the hospitalization and had a TEE guided  cardioversion  Pt previously seen by EP team 07/2020 for AFL with slow ventricular rate and intermittent AV block.  His rates improved to 50-60s on dopamine and he ultimately was weaned off and remained stable. He has bilateral fistulas and history of bacteremia and was felt to overall be a poor PPM candidate, though rates improved during that visit as his acute illness did, with no urgent indication for pacing.   Admitted this time with several days of R sided chest discomfort, nausea and vomiting.  Today he had 2 syncopal episodes. Called EMS and found to have HR in the 30-40s with intermittent AV block of unclear degree (at least 2nd) per report. long periods of ventricular standstill noted on admission with syncope.  Temp pacing wire placed, suspect again 2/2 acute illness/electrolyte impbalance, very poor PPM candidate  Assessment & Plan    1.  Complete heart block Clinically improved with dialysis 1:1 AV conduction has returned.   He has not had any V pacing in >24 hours Temp wire pulled this AM by Dr. Curt Bears Discussed with Dr. Curt Bears, agree with resuming amio    2. NSTEMI Likely due to acute medical illness No plans for cath at this time On Brilinta, statin no BB with bradycardia/hypotension   3. Prior DVT On heparin per primary team Warfarin outpt   4. H/o afib Currently in sinus rhythm   5. Chronic systolic dysfunction Volume managed with HD May benefit from general cardiology management as he improves   6. ESRD/ hyperkalemia He is clinically much improved with HD  7. Hypotension On midodrine out pt and here Defer to nephrology/CCM    For questions or updates, please contact [place] Please consult [email or web address] for contact info under        Signed, Baldwin Jamaica, PA-C  09/11/2021, 7:42 AM    I have seen and examined this patient with Tommye Standard.  Agree with above, note added to reflect my findings.  Currently feeling well.  No chest pain or  shortness of breath.  Remains in bed with temporary pacemaker wire.  No ventricular pacing is noted over the last 24 hours.  GEN: Well nourished, well developed, in no acute distress  HEENT: normal  Neck: no JVD, carotid bruits, or masses Cardiac: RRR; no murmurs, rubs, or gallops,no edema  Respiratory:  clear to auscultation bilaterally, normal work of breathing GI: soft, nontender, nondistended, + BS MS: no deformity or atrophy  Skin: warm and dry,  Neuro:  Strength and sensation are intact Psych: euthymic mood, full affect   1.  Complete heart block: Likely due to electrolyte abnormalities.  Heart block improved after dialysis.  Temporary wire has been pulled.  No indication for pacing at this time.  EP to sign off.  Please do not hesitate to call back if further needs arise.  Lurie Mullane M. Mikya Don MD 10/03/2021 3:13 PM

## 2021-09-16 ENCOUNTER — Encounter (HOSPITAL_COMMUNITY): Payer: Self-pay | Admitting: Internal Medicine

## 2021-09-16 ENCOUNTER — Inpatient Hospital Stay (HOSPITAL_COMMUNITY): Payer: Medicare HMO

## 2021-09-16 DIAGNOSIS — Z9911 Dependence on respirator [ventilator] status: Secondary | ICD-10-CM | POA: Diagnosis not present

## 2021-09-16 DIAGNOSIS — I442 Atrioventricular block, complete: Secondary | ICD-10-CM | POA: Diagnosis not present

## 2021-09-16 DIAGNOSIS — R001 Bradycardia, unspecified: Secondary | ICD-10-CM | POA: Diagnosis not present

## 2021-09-16 DIAGNOSIS — J9601 Acute respiratory failure with hypoxia: Secondary | ICD-10-CM | POA: Diagnosis not present

## 2021-09-16 DIAGNOSIS — T829XXA Unspecified complication of cardiac and vascular prosthetic device, implant and graft, initial encounter: Secondary | ICD-10-CM

## 2021-09-16 DIAGNOSIS — Z711 Person with feared health complaint in whom no diagnosis is made: Secondary | ICD-10-CM

## 2021-09-16 DIAGNOSIS — R55 Syncope and collapse: Secondary | ICD-10-CM

## 2021-09-16 DIAGNOSIS — Z7189 Other specified counseling: Secondary | ICD-10-CM

## 2021-09-16 DIAGNOSIS — Z515 Encounter for palliative care: Secondary | ICD-10-CM

## 2021-09-16 DIAGNOSIS — Z789 Other specified health status: Secondary | ICD-10-CM

## 2021-09-16 LAB — RENAL FUNCTION PANEL
Albumin: 2.9 g/dL — ABNORMAL LOW (ref 3.5–5.0)
Albumin: 2.9 g/dL — ABNORMAL LOW (ref 3.5–5.0)
Albumin: 2.6 g/dL — ABNORMAL LOW (ref 3.5–5.0)
Anion gap: 17 — ABNORMAL HIGH (ref 5–15)
Anion gap: 16 — ABNORMAL HIGH (ref 5–15)
Anion gap: 13 (ref 5–15)
BUN: 56 mg/dL — ABNORMAL HIGH (ref 6–20)
BUN: 48 mg/dL — ABNORMAL HIGH (ref 6–20)
BUN: 40 mg/dL — ABNORMAL HIGH (ref 6–20)
CO2: 18 mmol/L — ABNORMAL LOW (ref 22–32)
CO2: 21 mmol/L — ABNORMAL LOW (ref 22–32)
CO2: 22 mmol/L (ref 22–32)
Calcium: 8.4 mg/dL — ABNORMAL LOW (ref 8.9–10.3)
Calcium: 8.3 mg/dL — ABNORMAL LOW (ref 8.9–10.3)
Calcium: 8.4 mg/dL — ABNORMAL LOW (ref 8.9–10.3)
Chloride: 94 mmol/L — ABNORMAL LOW (ref 98–111)
Chloride: 95 mmol/L — ABNORMAL LOW (ref 98–111)
Chloride: 98 mmol/L (ref 98–111)
Creatinine, Ser: 10.28 mg/dL — ABNORMAL HIGH (ref 0.61–1.24)
Creatinine, Ser: 8.37 mg/dL — ABNORMAL HIGH (ref 0.61–1.24)
Creatinine, Ser: 7.1 mg/dL — ABNORMAL HIGH (ref 0.61–1.24)
GFR, Estimated: 5 mL/min — ABNORMAL LOW (ref >60)
GFR, Estimated: 7 mL/min — ABNORMAL LOW (ref >60)
GFR, Estimated: 8 mL/min — ABNORMAL LOW (ref >60)
Glucose, Bld: 259 mg/dL — ABNORMAL HIGH (ref 70–99)
Glucose, Bld: 173 mg/dL — ABNORMAL HIGH (ref 70–99)
Glucose, Bld: 145 mg/dL — ABNORMAL HIGH (ref 70–99)
Phosphorus: 7.7 mg/dL — ABNORMAL HIGH (ref 2.5–4.6)
Phosphorus: 4.7 mg/dL — ABNORMAL HIGH (ref 2.5–4.6)
Phosphorus: 4.4 mg/dL (ref 2.5–4.6)
Potassium: 6.5 mmol/L (ref 3.5–5.1)
Potassium: 4.8 mmol/L (ref 3.5–5.1)
Potassium: 4.6 mmol/L (ref 3.5–5.1)
Sodium: 129 mmol/L — ABNORMAL LOW (ref 135–145)
Sodium: 132 mmol/L — ABNORMAL LOW (ref 135–145)
Sodium: 133 mmol/L — ABNORMAL LOW (ref 135–145)

## 2021-09-16 LAB — POCT I-STAT 7, (LYTES, BLD GAS, ICA,H+H)
Acid-Base Excess: 0 mmol/L (ref 0.0–2.0)
Acid-base deficit: 1 mmol/L (ref 0.0–2.0)
Acid-base deficit: 6 mmol/L — ABNORMAL HIGH (ref 0.0–2.0)
Acid-base deficit: 7 mmol/L — ABNORMAL HIGH (ref 0.0–2.0)
Bicarbonate: 21.2 mmol/L (ref 20.0–28.0)
Bicarbonate: 22 mmol/L (ref 20.0–28.0)
Bicarbonate: 23.2 mmol/L (ref 20.0–28.0)
Bicarbonate: 23.3 mmol/L (ref 20.0–28.0)
Calcium, Ion: 1.1 mmol/L — ABNORMAL LOW (ref 1.15–1.40)
Calcium, Ion: 1.1 mmol/L — ABNORMAL LOW (ref 1.15–1.40)
Calcium, Ion: 1.1 mmol/L — ABNORMAL LOW (ref 1.15–1.40)
Calcium, Ion: 1.15 mmol/L (ref 1.15–1.40)
HCT: 33 % — ABNORMAL LOW (ref 39.0–52.0)
HCT: 35 % — ABNORMAL LOW (ref 39.0–52.0)
HCT: 35 % — ABNORMAL LOW (ref 39.0–52.0)
HCT: 36 % — ABNORMAL LOW (ref 39.0–52.0)
Hemoglobin: 11.2 g/dL — ABNORMAL LOW (ref 13.0–17.0)
Hemoglobin: 11.9 g/dL — ABNORMAL LOW (ref 13.0–17.0)
Hemoglobin: 11.9 g/dL — ABNORMAL LOW (ref 13.0–17.0)
Hemoglobin: 12.2 g/dL — ABNORMAL LOW (ref 13.0–17.0)
O2 Saturation: 100 %
O2 Saturation: 100 %
O2 Saturation: 88 %
O2 Saturation: 89 %
Patient temperature: 98
Patient temperature: 98
Patient temperature: 98.6
Potassium: 4.5 mmol/L (ref 3.5–5.1)
Potassium: 5 mmol/L (ref 3.5–5.1)
Potassium: 5.7 mmol/L — ABNORMAL HIGH (ref 3.5–5.1)
Potassium: 6.3 mmol/L (ref 3.5–5.1)
Sodium: 130 mmol/L — ABNORMAL LOW (ref 135–145)
Sodium: 132 mmol/L — ABNORMAL LOW (ref 135–145)
Sodium: 133 mmol/L — ABNORMAL LOW (ref 135–145)
Sodium: 134 mmol/L — ABNORMAL LOW (ref 135–145)
TCO2: 23 mmol/L (ref 22–32)
TCO2: 24 mmol/L (ref 22–32)
TCO2: 24 mmol/L (ref 22–32)
TCO2: 24 mmol/L (ref 22–32)
pCO2 arterial: 32.9 mmHg (ref 32.0–48.0)
pCO2 arterial: 37.4 mmHg (ref 32.0–48.0)
pCO2 arterial: 47.7 mmHg (ref 32.0–48.0)
pCO2 arterial: 58.6 mmHg — ABNORMAL HIGH (ref 32.0–48.0)
pH, Arterial: 7.183 — CL (ref 7.350–7.450)
pH, Arterial: 7.256 — ABNORMAL LOW (ref 7.350–7.450)
pH, Arterial: 7.4 (ref 7.350–7.450)
pH, Arterial: 7.458 — ABNORMAL HIGH (ref 7.350–7.450)
pO2, Arterial: 170 mmHg — ABNORMAL HIGH (ref 83.0–108.0)
pO2, Arterial: 191 mmHg — ABNORMAL HIGH (ref 83.0–108.0)
pO2, Arterial: 67 mmHg — ABNORMAL LOW (ref 83.0–108.0)
pO2, Arterial: 69 mmHg — ABNORMAL LOW (ref 83.0–108.0)

## 2021-09-16 LAB — HEPARIN LEVEL (UNFRACTIONATED)
Heparin Unfractionated: 0.23 IU/mL — ABNORMAL LOW (ref 0.30–0.70)
Heparin Unfractionated: 0.59 IU/mL (ref 0.30–0.70)

## 2021-09-16 LAB — CBC
HCT: 35.4 % — ABNORMAL LOW (ref 39.0–52.0)
Hemoglobin: 10.6 g/dL — ABNORMAL LOW (ref 13.0–17.0)
MCH: 30 pg (ref 26.0–34.0)
MCHC: 29.9 g/dL — ABNORMAL LOW (ref 30.0–36.0)
MCV: 100.3 fL — ABNORMAL HIGH (ref 80.0–100.0)
Platelets: 380 10*3/uL (ref 150–400)
RBC: 3.53 MIL/uL — ABNORMAL LOW (ref 4.22–5.81)
RDW: 22.4 % — ABNORMAL HIGH (ref 11.5–15.5)
WBC: 14.2 10*3/uL — ABNORMAL HIGH (ref 4.0–10.5)
nRBC: 0.2 % (ref 0.0–0.2)

## 2021-09-16 LAB — HIV-1 RNA QUANT-NO REFLEX-BLD
HIV 1 RNA Quant: <20 copies/mL
LOG10 HIV-1 RNA: UNDETERMINED log10copy/mL

## 2021-09-16 LAB — GLUCOSE, CAPILLARY
Glucose-Capillary: 227 mg/dL — ABNORMAL HIGH (ref 70–99)
Glucose-Capillary: 194 mg/dL — ABNORMAL HIGH (ref 70–99)
Glucose-Capillary: 145 mg/dL — ABNORMAL HIGH (ref 70–99)
Glucose-Capillary: 99 mg/dL (ref 70–99)
Glucose-Capillary: 73 mg/dL (ref 70–99)

## 2021-09-16 LAB — BASIC METABOLIC PANEL
Anion gap: 13 (ref 5–15)
BUN: 45 mg/dL — ABNORMAL HIGH (ref 6–20)
CO2: 21 mmol/L — ABNORMAL LOW (ref 22–32)
Calcium: 8.4 mg/dL — ABNORMAL LOW (ref 8.9–10.3)
Chloride: 97 mmol/L — ABNORMAL LOW (ref 98–111)
Creatinine, Ser: 8.14 mg/dL — ABNORMAL HIGH (ref 0.61–1.24)
GFR, Estimated: 7 mL/min — ABNORMAL LOW (ref >60)
Glucose, Bld: 192 mg/dL — ABNORMAL HIGH (ref 70–99)
Potassium: 4.8 mmol/L (ref 3.5–5.1)
Sodium: 131 mmol/L — ABNORMAL LOW (ref 135–145)

## 2021-09-16 LAB — HIV-1/HIV-2 QUALITATIVE RNA
Final Interpretation: NEGATIVE
HIV-1 RNA, Qualitative: NONREACTIVE
HIV-2 RNA, Qualitative: NONREACTIVE

## 2021-09-16 MED ORDER — MECLIZINE HCL 12.5 MG PO TABS
12.5000 mg | ORAL_TABLET | Freq: Every day | ORAL | Status: DC | PRN
  Filled 2021-09-16: qty 1

## 2021-09-16 MED ORDER — NOREPINEPHRINE 4 MG/250ML-% IV SOLN: 0.0000 ug/min | INTRAVENOUS | Status: DC

## 2021-09-16 MED ORDER — SODIUM CHLORIDE 0.9 % IV SOLN: INTRAVENOUS | Status: DC | PRN

## 2021-09-16 MED ORDER — POLYETHYLENE GLYCOL 3350 17 G PO PACK: 17.0000 g | PACK | Freq: Every day | ORAL | Status: DC | PRN

## 2021-09-16 MED ORDER — AMIODARONE HCL 200 MG PO TABS: 200.0000 mg | ORAL_TABLET | Freq: Every day | ORAL | Status: DC

## 2021-09-16 MED ORDER — SODIUM CHLORIDE 0.9% FLUSH
3.0000 mL | Freq: Two times a day (BID) | INTRAVENOUS | Status: DC
  Administered 2021-09-16 – 2021-09-22 (×14): 3 mL via INTRAVENOUS

## 2021-09-16 MED ORDER — HYDRALAZINE HCL 20 MG/ML IJ SOLN: 10.0000 mg | INTRAMUSCULAR | Status: AC | PRN

## 2021-09-16 MED ORDER — ONDANSETRON HCL 4 MG/2ML IJ SOLN: 4.0000 mg | Freq: Four times a day (QID) | INTRAMUSCULAR | Status: DC | PRN

## 2021-09-16 MED ORDER — PRISMASOL BGK 0/2.5 32-2.5 MEQ/L EC SOLN
Status: DC
  Filled 2021-09-16 (×13): qty 5000

## 2021-09-16 MED ORDER — MIDODRINE HCL 5 MG PO TABS
10.0000 mg | ORAL_TABLET | Freq: Three times a day (TID) | ORAL | Status: DC
  Administered 2021-09-16 – 2021-09-18 (×5): 10 mg
  Filled 2021-09-16 (×5): qty 2

## 2021-09-16 MED ORDER — LABETALOL HCL 5 MG/ML IV SOLN: 10.0000 mg | INTRAVENOUS | Status: AC | PRN

## 2021-09-16 MED ORDER — DOCUSATE SODIUM 50 MG/5ML PO LIQD
100.0000 mg | Freq: Two times a day (BID) | ORAL | Status: DC | PRN
  Administered 2021-09-18: 100 mg

## 2021-09-16 MED ORDER — NOREPINEPHRINE 16 MG/250ML-% IV SOLN
0.0000 ug/min | INTRAVENOUS | Status: DC
  Administered 2021-09-16: 22 ug/min via INTRAVENOUS
  Administered 2021-09-17: 10 ug/min via INTRAVENOUS
  Administered 2021-09-17: 28 ug/min via INTRAVENOUS
  Administered 2021-09-18: 18 ug/min via INTRAVENOUS
  Administered 2021-09-19: 28 ug/min via INTRAVENOUS
  Administered 2021-09-19 (×2): 26 ug/min via INTRAVENOUS
  Administered 2021-09-20 (×2): 30 ug/min via INTRAVENOUS
  Administered 2021-09-21: 90 ug/min via INTRAVENOUS
  Administered 2021-09-21: 39 ug/min via INTRAVENOUS
  Administered 2021-09-21: 90 ug/min via INTRAVENOUS
  Administered 2021-09-21: 40 ug/min via INTRAVENOUS
  Administered 2021-09-22 (×5): 100 ug/min via INTRAVENOUS
  Filled 2021-09-16 (×22): qty 250

## 2021-09-16 MED ORDER — ACETAMINOPHEN 325 MG PO TABS: 650.0000 mg | ORAL_TABLET | ORAL | Status: DC | PRN

## 2021-09-16 MED ORDER — TICAGRELOR 90 MG PO TABS
90.0000 mg | ORAL_TABLET | Freq: Two times a day (BID) | ORAL | Status: DC
  Administered 2021-09-16 – 2021-09-22 (×11): 90 mg
  Filled 2021-09-16 (×12): qty 1

## 2021-09-16 MED ORDER — DOCUSATE SODIUM 50 MG/5ML PO LIQD
100.0000 mg | Freq: Two times a day (BID) | ORAL | Status: DC
  Administered 2021-09-16 – 2021-09-17 (×4): 100 mg
  Filled 2021-09-16 (×4): qty 10

## 2021-09-16 MED ORDER — CALCIUM GLUCONATE-NACL 1-0.675 GM/50ML-% IV SOLN
1.0000 g | Freq: Once | INTRAVENOUS | Status: AC
  Administered 2021-09-16: 1000 mg via INTRAVENOUS

## 2021-09-16 MED ORDER — ORAL CARE MOUTH RINSE
15.0000 mL | OROMUCOSAL | Status: DC
  Administered 2021-09-16 – 2021-09-22 (×64): 15 mL via OROMUCOSAL

## 2021-09-16 MED ORDER — FENTANYL CITRATE PF 50 MCG/ML IJ SOSY
50.0000 ug | PREFILLED_SYRINGE | INTRAMUSCULAR | Status: DC | PRN
  Administered 2021-09-18: 50 ug via INTRAVENOUS

## 2021-09-16 MED ORDER — CALCIUM GLUCONATE-NACL 1-0.675 GM/50ML-% IV SOLN
INTRAVENOUS | Status: AC
  Filled 2021-09-16: qty 50

## 2021-09-16 MED ORDER — ASPIRIN 81 MG PO CHEW
81.0000 mg | CHEWABLE_TABLET | Freq: Every day | ORAL | Status: DC
  Administered 2021-09-17 – 2021-09-21 (×5): 81 mg
  Filled 2021-09-16 (×5): qty 1

## 2021-09-16 MED ORDER — ATROPINE SULFATE 1 MG/10ML IJ SOSY
PREFILLED_SYRINGE | INTRAMUSCULAR | Status: AC
  Filled 2021-09-16: qty 10

## 2021-09-16 MED ORDER — B COMPLEX-C PO TABS
1.0000 | ORAL_TABLET | Freq: Every day | ORAL | Status: DC
  Administered 2021-09-16 – 2021-09-21 (×6): 1
  Filled 2021-09-16 (×6): qty 1

## 2021-09-16 MED ORDER — PANTOPRAZOLE 2 MG/ML SUSPENSION
40.0000 mg | Freq: Every day | ORAL | Status: DC
  Administered 2021-09-17 – 2021-09-21 (×5): 40 mg
  Filled 2021-09-16 (×7): qty 20

## 2021-09-16 MED ORDER — PRISMASOL BGK 0/2.5 32-2.5 MEQ/L EC SOLN
Status: DC
  Filled 2021-09-16 (×10): qty 5000

## 2021-09-16 MED ORDER — ROPINIROLE HCL 0.25 MG PO TABS
0.2500 mg | ORAL_TABLET | Freq: Every day | ORAL | Status: DC
  Administered 2021-09-16 – 2021-09-20 (×5): 0.25 mg
  Filled 2021-09-16 (×7): qty 1

## 2021-09-16 MED ORDER — SODIUM CHLORIDE 0.9 % IV SOLN: 250.0000 mL | INTRAVENOUS | Status: DC | PRN

## 2021-09-16 MED ORDER — PRISMASOL BGK 0/2.5 32-2.5 MEQ/L EC SOLN
Status: DC
  Filled 2021-09-16 (×3): qty 5000

## 2021-09-16 MED ORDER — PRISMASOL BGK 0/2.5 32-2.5 MEQ/L EC SOLN
Status: DC
  Filled 2021-09-16 (×4): qty 5000

## 2021-09-16 MED ORDER — SODIUM BICARBONATE 8.4 % IV SOLN
50.0000 meq | Freq: Once | INTRAVENOUS | Status: AC
  Administered 2021-09-16: 50 meq via INTRAVENOUS

## 2021-09-16 MED ORDER — POLYETHYLENE GLYCOL 3350 17 G PO PACK
17.0000 g | PACK | Freq: Every day | ORAL | Status: DC
  Administered 2021-09-16: 17 g
  Filled 2021-09-16: qty 1

## 2021-09-16 MED ORDER — FENTANYL CITRATE PF 50 MCG/ML IJ SOSY: 50.0000 ug | PREFILLED_SYRINGE | INTRAMUSCULAR | Status: DC | PRN

## 2021-09-16 MED ORDER — SODIUM BICARBONATE 8.4 % IV SOLN
INTRAVENOUS | Status: AC
  Filled 2021-09-16: qty 50

## 2021-09-16 MED ORDER — SODIUM CHLORIDE 0.9% FLUSH: 3.0000 mL | INTRAVENOUS | Status: DC | PRN

## 2021-09-16 MED ORDER — ATROPINE SULFATE 1 MG/10ML IJ SOSY
PREFILLED_SYRINGE | INTRAMUSCULAR | Status: DC | PRN
  Administered 2021-09-15: 1 mg via INTRAVENOUS

## 2021-09-16 MED ORDER — CHLORHEXIDINE GLUCONATE 0.12% ORAL RINSE (MEDLINE KIT)
15.0000 mL | Freq: Two times a day (BID) | OROMUCOSAL | Status: DC
  Administered 2021-09-16 – 2021-09-22 (×14): 15 mL via OROMUCOSAL

## 2021-09-16 MED ORDER — CLONAZEPAM 0.5 MG PO TABS
0.5000 mg | ORAL_TABLET | Freq: Two times a day (BID) | ORAL | Status: DC
  Administered 2021-09-16 – 2021-09-21 (×10): 0.5 mg
  Filled 2021-09-16 (×10): qty 1

## 2021-09-16 MED ORDER — ATORVASTATIN CALCIUM 80 MG PO TABS
80.0000 mg | ORAL_TABLET | Freq: Every day | ORAL | Status: DC
  Administered 2021-09-17 – 2021-09-21 (×5): 80 mg
  Filled 2021-09-16 (×5): qty 1

## 2021-09-16 NOTE — Progress Notes (Signed)
Per Clark DO goal SBP > 85.

## 2021-09-16 NOTE — Progress Notes (Signed)
Initial Nutrition Assessment  DOCUMENTATION CODES:   Morbid obesity  INTERVENTION:   If unable to extubate within 24-48 hours, recommend initiation of TF  Tube Feeding Recommendations via OG:  Vital AF 1.2 at 50 ml/hr Pro-source TF 90 mL TID Provides 156 g of protein, 1680 kcals and 972 mL of free water  Add B complex with C per tube  Currently on novolog meal coverage; recommend changing to q 4 hour TF coverage once TF initiated   NUTRITION DIAGNOSIS:   Inadequate oral intake related to acute illness as evidenced by NPO status.  GOAL:   Patient will meet greater than or equal to 90% of their needs  MONITOR:   Vent status, Weight trends, TF tolerance, Labs  REASON FOR ASSESSMENT:   Ventilator    ASSESSMENT:   55 yo male admitted with unstable bradycardia, NSTEMI, cardipogenic shock, acute on chronic respiratory failure requiring intubation. PMH includes ESRD on HD, DVT, CHF, CAD, OSA, pulmonary HTN  12/02 Admitted 12/05 Intubated  Recurrent bradycardia, asystole overnight requiring transcutaneous pacing and emergent venous pacing  Pt remains on vent support (sedated on fentanyl and versed), on CRRT via R Fem Temp HD with clotted LUE AVG.  Requiring levophed and dopamine-stable  Outpatient EDW 130 kg; current wt 131.7 kg. Noted weight of 126 kg on admit  Pt on significant binder therapy;on both renvela and ferric citrate. Also on sensipar. Binder should be on hold while NPO, no TF  Wounds all over patients legs-unknown etiology,  question if these are possibly from calciphylaxis   Constipated with no BM since admission; noted scheduled bowel regimen started today  Hyperkalemia corrected via CRRT. Noted pt is on potassium binder as an outpatient on non-HD days  Labs: potassium 5.0 (wdl), phosphorus 7.7 (H), sodium 129 (H) Meds: sensipar, ferric citrate, ss novolog, novolog with meals, semglee, renvela, colace, miralax   NUTRITION - FOCUSED PHYSICAL  EXAM:  Flowsheet Row Most Recent Value  Orbital Region No depletion  Upper Arm Region No depletion  Thoracic and Lumbar Region No depletion  Buccal Region Unable to assess  Temple Region Mild depletion  Clavicle Bone Region No depletion  Clavicle and Acromion Bone Region No depletion  Scapular Bone Region No depletion  Dorsal Hand Unable to assess  Patellar Region No depletion  Anterior Thigh Region No depletion  Posterior Calf Region No depletion  Edema (RD Assessment) Mild  Skin Other (Comment)  [eccyhmosis present]       Diet Order:   Diet Order             Diet NPO time specified  Diet effective now                   EDUCATION NEEDS:   Not appropriate for education at this time  Skin:  Skin Assessment: Skin Integrity Issues: Skin Integrity Issues:: Other (Comment) Other: unknown wounds on bilateral extremities  Last BM:  PTA  Height:   Ht Readings from Last 1 Encounters:  10/10/2021 5\' 7"  (1.702 m)    Weight:   Wt Readings from Last 1 Encounters:  09/16/21 131.7 kg    BMI:  Body mass index is 45.47 kg/m.  Estimated Nutritional Needs:   Kcal:  1500-1700  Protein:  135-165 g  Fluid:  1000 mL plus UOP  Kerman Passey MS, RDN, LDN, CNSC Registered Dietitian III Clinical Nutrition RD Pager and On-Call Pager Number Located in [place]

## 2021-09-16 NOTE — Progress Notes (Signed)
[facility] Progress Note  Subjective:   Recurrent bradycardia ---> asystole overnight req transcutaneous pacing and emergent venous pacing, intubated, now on 2 pressors K up to 6.5 this AM, started on CRRT 2K 400 / 0K 2000 / 2K 300, no UF  Vitals:   09/16/21 0800 09/16/21 0815 09/16/21 0830 09/16/21 0900  BP: (!) 114/43   (!) 114/49  Pulse: 61 60 (!) 55 (!) 59  Resp:      Temp: 99.1 F (37.3 C)     TempSrc:      SpO2: 100% 100% 100% 100%  Weight:      Height:        Exam:   Intubted, sedated  Coarse BS b/l  regular  Abd soft ntnd no ascites   Ext no LE edema   Alert, NF, ox3    LUA AVF no bruit, temp HD cath R groin   OP HD: MW TTS   130kg  400/500  2/2 bath  LUE AVF    - calcitriol 2.25 ug tiw  - iron 41m weekly  - auryxia/ renvela binders  - lokelma 8.4 on non HD days     CXR 12/03 - vasc congestion, IS edema mild   CT 12/03 angio chest - diffuse GG changes, small lung volumes   Assessment/ Plan: Unstable bradycardia --> Asystole 09/20/2021 with replacement of temp venous pacemaker; per EP Chronic hypotension, in shock now on 2 pressors ESRD: as above, THS.  Clotted LUE AVG, has R Fem Temp HD cath at this time.  Req CRRT for high K.  Clotted AVG - in LUE. IR following declot and/or TDC, not stable at this time for procedures Acute on chronic resp failure - AHRF s/p ETT 149/1during asytolic arrest, per CCM NSTEMI - ^Cleora Fleet per cardiology Volume - Holding UF at this time given hypotension, reassess Chronic syst/ diast CHF CAD  Atrial fib - per EP Acute encephalopathy - improved w/ dialysis Anemia ckd - cont Fe weekly , no esa needed (Hb > 10)  MBD ckd - trend Ca and PO on CRRT Chronic hyperkalemia: outpt [place] hold while on CRRT  Check BMP 4h after starting CRRT, adjust K bath as needed  Recommend GOC and involvement of palliative medicine   RRexene Agent MD  09/16/2021, 10:10 AM   Recent Labs  Lab 10/11/2021 0447 09/26/2021 2230  09/14/2021 2241 09/16/21 0120 09/16/21 0431  K 5.2* 5.6*   < > 6.3* 6.5*  BUN 45* 51*  --   --  56*  CREATININE 8.67* 9.44*  --   --  10.28*  CALCIUM 8.4* 8.3*  --   --  8.4*  PHOS 7.0*  --   --   --  7.7*  HGB 9.1* 10.2*   < > 11.9* 10.6*   < > = values in this interval not displayed.    Inpatient medications:  amiodarone  200 mg Oral Daily   aspirin  81 mg Oral Daily   atorvastatin  80 mg Oral Daily   chlorhexidine gluconate (MEDLINE KIT)  15 mL Mouth Rinse BID   Chlorhexidine Gluconate Cloth  6 each Topical Q0600   cinacalcet  30 mg Oral Q breakfast   clonazePAM  0.5 mg Oral BID   fentaNYL (SUBLIMAZE) injection  12.5 mcg Intravenous Once   ferric citrate  420 mg Oral With snacks   ferric citrate  630 mg Oral TID WC   insulin aspart  0-20 Units Subcutaneous TID WC  insulin aspart  4 Units Subcutaneous TID WC   insulin glargine-yfgn  70 Units Subcutaneous QHS   mouth rinse  15 mL Mouth Rinse 10 times per day   midodrine  10 mg Oral TID WC   ondansetron (ZOFRAN) IV  4 mg Intravenous Once   pantoprazole  40 mg Oral Daily   patiromer  8.4 g Oral Q24H   rOPINIRole  0.25 mg Oral QHS   sevelamer carbonate  1,600 mg Oral With snacks   sevelamer carbonate  3,200 mg Oral TID WC   sodium chloride flush  3 mL Intravenous Q12H   Thrombi-Pad  1 each Topical STAT   ticagrelor  90 mg Oral BID    sodium chloride Stopped (09/13/21 1550)   sodium chloride     sodium chloride 10 mL/hr at 09/16/21 0900   sodium chloride 10 mL/hr at 09/16/21 0900   DOPamine     DOPamine 15 mcg/kg/min (09/16/21 0900)   fentaNYL infusion INTRAVENOUS 100 mcg/hr (09/16/21 0900)   heparin 2,150 Units/hr (09/16/21 0937)   midazolam 4 mg/hr (09/16/21 0900)   norepinephrine (LEVOPHED) Adult infusion 22 mcg/min (09/16/21 0900)   prismasol BGK 0/2.5 2,000 mL/hr at 09/16/21 1008   prismasol BGK 2/2.5 replacement solution 400 mL/hr at 09/16/21 0751   prismasol BGK 2/2.5 replacement solution 300 mL/hr at 09/16/21  0752   sodium chloride, sodium chloride, sodium chloride, sodium chloride, acetaminophen, alteplase, docusate sodium, heparin, lidocaine (PF), lidocaine-prilocaine, meclizine, nitroGLYCERIN, ondansetron (ZOFRAN) IV, pentafluoroprop-tetrafluoroeth, polyethylene glycol, sodium chloride flush, sodium chloride flush

## 2021-09-16 NOTE — Progress Notes (Signed)
Paged Sanford MD in regards to 1200 K result. Orders received for all 2K CRRT bags.

## 2021-09-16 NOTE — Progress Notes (Signed)
Spoke with Candiss Norse MD in regards to M150 CRRT filter already primed with orders for M100. Okay to start CRRT with M150 filter.

## 2021-09-16 NOTE — Progress Notes (Signed)
[facility] Progress Note Patient Name: Madison Albea DOB: Feb 19, 1966 MRN: [id number]   Date of Service  09/16/2021  HPI/Events of Note  ABG reviewed.  eICU Interventions  RR increased to 30, PEEP increased to 8, ABG at 6:00 AM.        Wende Crease U Nader Boys 09/16/2021, 4:52 AM

## 2021-09-16 NOTE — Consult Note (Addendum)
Consultation Note Date: 09/16/2021   Patient Name: [name] [name]  DOB: 17-Apr-1966  MRN: [id number]  Age / Sex: 55 y.o., male  PCP: [name] Koch, MD Referring Physician: Julian Hy, DO  Reason for Consultation: Establishing goals of care, "family coordination, sister lives in [place], has friends here, multiple severe comorbidities"  HPI/Patient Profile: 55 y.o. male  with past medical history of ESRD on HD, COPD, AI, RBBB, OSA, DMII, persistent atrial flutter, chronic OAC on coumadin, chronic DVT, chronic diastolic CHF, h/o endocarditis in 2016, CAD (occlusion of the LAD beyond the stented segment. DES placed) presented to ED on 09/29/2021 from home after two syncopal episodes and c/o N/V x 2 days. EMS found patient with HR in 30s-40s with prolonged periods of no ventricular response. Patient remained severely bradycardic in ED. Electrophysiology recommended transcutaneous pacing (patient not good candidate for PPM). Nephrology was consulted for emergent dialysis due to hyperkalemia (patient has chronic hyperkalemia outpatient). Patient was  admitted on 09/24/2021 with syncope/ventricular standstill, CAD s/p NSTEMI this admission, acute encephalopathy, unstable bradycardia, cardiogenic shock, acute on chronic respiratory failure, pulmonary edema, hyperkalemia. Patient was improving and temp pacing wire was removed on 12/5 AM. That night patient developed pausing again, which required another temp pacing wire placement as well as intubation. Patient is currently intubated and sedated in ICU. Medical team has not had much success getting in contact with patient's next of kin that live out of the country - mother and sister in [place].   Clinical Assessment and Goals of Care: I have reviewed medical records including EPIC notes, labs, and imaging. Received report from primary RN - no acute concerns other than  critical illness.  RN states patient's mother and sister are in [place], not [place]. RN reports patient's HR has improved with initiation of CRRT.   Went to visit patient at bedside - no family/visitors present. Patient was lying in bed intubated and sedated. No signs or non-verbal gestures of pain or discomfort noted. No respiratory distress, increased work of breathing, or secretions noted. Currently on heparin, norepi, dopamine, fentanyl, midazolam drips.   6:31 PM Utilized [facility] operator: Attempted to call mother/[name], per international phone number listed in documentation, to discuss diagnosis, prognosis, [place], EOL wishes, disposition, and options - call was not able to be completed.  Called friend/[name] [name] - he was able to speak with Dr. Carlis [name] earlier today and has a clear understanding of the patient's current acute medical situation. He confirms patient's mother lives in [place] and brother in [place]. He tells me patient's mother does not speak [place]. [place] is 6 hours ahead of EST. [name] will contact patient's family when it is morning for them - he wants to ensure patient's mother has support when she receives news of the patient's critical condition. Discussed goal at this time is to try and set up a meeting with PMT and patient's mother - likely best if [name] can meet PMT in person and utilize his phone to call patient's mother via speakerphone with interpreter. He is  willing to help and will coordinate a meeting time between mother and PMT. All questions and concerns addressed. Encouraged to call with questions and/or concerns. PMT number provided. Informed him PMT card was also left at patient's bedside for him.    Primary Decision Maker: NEXT OF KIN - legally is his mother/[name] [name] - she does not speak [place]. Have not been able to get in contact with her   SUMMARY OF RECOMMENDATIONS   Continue current full scope medical treatment Continue full code status as previously  documented Was able to speak with patient's friend/[name]: goal at this time is to try and set up a meeting with PMT and patient's mother - likely best if [name] can meet PMT in person and utilize his phone to call patient's mother via speakerphone with interpreter [name] will speak with patient's mother to schedule meeting - hopefully for Wednesday 11/7 or Thursday 11/8 and notify PMT of time Patient's mother does not speak English - will need interpreter  PMT will continue to follow and support holistically   Code Status/Advance Care Planning: Full code   Palliative Prophylaxis:  Aspiration, Bowel Regimen, Delirium Protocol, Frequent Pain Assessment, Oral Care, and Turn Reposition  Additional Recommendations (Limitations, Scope, Preferences): Full Scope Treatment  Psycho-social/Spiritual:  Created space and opportunity for patient and friend to express thoughts and feelings regarding patient's current medical situation.  Emotional support and therapeutic listening provided.  Prognosis:  Poor long term prognosis due to multiple severe chronic issues that complicate one another  Discharge Planning: To Be Determined      Primary Diagnoses: Present on Admission:  Bradycardia   I have reviewed the medical record, interviewed the patient and family, and examined the patient. The following aspects are pertinent.  Past Medical History:  Diagnosis Date   Renal disorder    Social History   Socioeconomic History   Marital status: Divorced    Spouse name: Not on file   Number of children: Not on file   Years of education: Not on file   Highest education level: Not on file  Occupational History   Not on file  Tobacco Use   Smoking status: Not on file   Smokeless tobacco: Not on file  Substance and Sexual Activity   Alcohol use: Not on file   Drug use: Not on file   Sexual activity: Not on file  Other Topics Concern   Not on file  Social History Narrative   Not on file    Social Determinants of Health   Financial Resource Strain: Not on file  Food Insecurity: Not on file  Transportation Needs: Not on file  Physical Activity: Not on file  Stress: Not on file  Social Connections: Not on file   History reviewed. No pertinent family history. Scheduled Meds:  [START ON 09/17/2021] amiodarone  200 mg Per Tube Daily   [START ON 09/17/2021] aspirin  81 mg Per Tube Daily   [START ON 09/17/2021] atorvastatin  80 mg Per Tube Daily   B-complex with vitamin C  1 tablet Per Tube Daily   chlorhexidine gluconate (MEDLINE KIT)  15 mL Mouth Rinse BID   Chlorhexidine Gluconate Cloth  6 each Topical Q0600   clonazePAM  0.5 mg Per Tube BID   docusate  100 mg Per Tube BID   insulin aspart  0-20 Units Subcutaneous TID WC   insulin aspart  4 Units Subcutaneous TID WC   insulin glargine-yfgn  70 Units Subcutaneous QHS   mouth rinse  15 mL Mouth Rinse 10 times per day   midodrine  10 mg Per Tube TID WC   ondansetron (ZOFRAN) IV  4 mg Intravenous Once   [START ON 09/17/2021] pantoprazole sodium  40 mg Per Tube Daily   polyethylene glycol  17 g Per Tube Daily   rOPINIRole  0.25 mg Per Tube QHS   sodium chloride flush  3 mL Intravenous Q12H   ticagrelor  90 mg Per Tube BID   Continuous Infusions:  sodium chloride Stopped (09/13/21 1550)   sodium chloride     sodium chloride 10 mL/hr at 09/16/21 1600   sodium chloride 10 mL/hr at 09/16/21 1600   DOPamine 2 mcg/kg/min (09/16/21 1600)   fentaNYL infusion INTRAVENOUS 150 mcg/hr (09/16/21 1600)   heparin 2,150 Units/hr (09/16/21 1600)   midazolam 4 mg/hr (09/16/21 1600)   norepinephrine (LEVOPHED) Adult infusion 20 mcg/min (09/16/21 1600)   prismasol BGK 2/2.5 dialysis solution 2,000 mL/hr at 09/16/21 1411   prismasol BGK 2/2.5 replacement solution 400 mL/hr at 09/16/21 0751   prismasol BGK 2/2.5 replacement solution 300 mL/hr at 09/16/21 0752   PRN Meds:.sodium chloride, sodium chloride, sodium chloride, sodium chloride,  acetaminophen, alteplase, docusate, fentaNYL (SUBLIMAZE) injection, fentaNYL (SUBLIMAZE) injection, heparin, lidocaine (PF), lidocaine-prilocaine, meclizine, ondansetron (ZOFRAN) IV, pentafluoroprop-tetrafluoroeth, polyethylene glycol, sodium chloride flush, sodium chloride flush Medications Prior to Admission:  Prior to Admission medications   Medication Sig Start Date End Date Taking? Authorizing Provider  amiodarone (PACERONE) 200 MG tablet Take 200 mg by mouth daily. 08/30/21  Yes [provider]  atorvastatin (LIPITOR) 80 MG tablet Take 80 mg by mouth daily. 08/18/21  Yes [provider]  cinacalcet (SENSIPAR) 30 MG tablet Take 30 mg by mouth daily.   Yes [provider]  clonazePAM (KLONOPIN) 0.5 MG tablet Take 0.5 mg by mouth 3 (three) times daily as needed for anxiety. 09/01/21  Yes [provider]  ferric citrate (AURYXIA) 1 GM 210 MG(Fe) tablet Take 420-630 mg by mouth See admin instructions. Take 3 tablets (630 mg) by mouth three times daily with meals and 2 tablets (420 mg) with snacks   Yes [provider]  insulin glargine, 2 Unit Dial, (TOUJEO MAX SOLOSTAR) 300 UNIT/ML Solostar Pen Inject 80 Units into the skin 2 (two) times daily.   Yes [provider]  insulin regular human CONCENTRATED (HUMULIN R U-500 KWIKPEN) 500 UNIT/ML KwikPen Inject 50-70 Units into the skin See admin instructions. Inject 60 units subcutaneously with breakfast, 70 units with lunch and 50 units with supper   Yes [provider]  meclizine (ANTIVERT) 12.5 MG tablet Take 12.5 mg by mouth every morning. 09/13/21  Yes [provider]  midodrine (PROAMATINE) 5 MG tablet Take 5 mg by mouth See admin instructions. Take 1 tablet (5 mg) by mouth three times daily, may also take 2 tablets (10 mg) during dialysis - Tuesday, Thursday, Saturday - as needed for low blood pressure 09/07/21  Yes [provider]  mupirocin ointment (BACTROBAN) 2 % Apply  1 application topically 3 (three) times daily as needed (wound care). 04/28/21  Yes [provider]  nitroGLYCERIN (NITROSTAT) 0.4 MG SL tablet Place 0.4 mg under the tongue every 5 (five) minutes as needed for chest pain. 06/04/21  Yes [provider]  pantoprazole (PROTONIX) 40 MG tablet Take 40 mg by mouth at bedtime. 07/21/21  Yes [provider]  patiromer (VELTASSA) 8.4 g packet Take 8.4 g by mouth daily.   Yes [provider]  rOPINIRole (  REQUIP) 0.25 MG tablet Take 0.25 mg by mouth at bedtime. 09/10/21  Yes [provider]  sevelamer carbonate (RENVELA) 800 MG tablet Take 1,600-3,200 mg by mouth See admin instructions. Take 4 tablets (3200 mg) by mouth three times daily with meals, take 2 tablets (1600 mg) with snacks   Yes [provider]  silver sulfADIAZINE (SILVADENE) 1 % cream Apply 1 application topically daily as needed (wound care).   Yes [provider]  ticagrelor (BRILINTA) 90 MG TABS tablet Take by mouth 2 (two) times daily.   Yes [provider]  warfarin (COUMADIN) 5 MG tablet Take 2.5 mg by mouth See admin instructions. Per anti-coag visit 09/11/2021: take 1/2 tablet (2.5 mg) by mouth on Monday, Wednesday, Friday; take 1 tablet (5 mg) on Sunday, Tuesday, Thursday, Saturday. 07/09/21  Yes [provider]   Allergies  Allergen Reactions   Ace Inhibitors Cough   Pork-Derived Products Other (See Comments)    Entry determined to be clinically insignificant: OK TO GIVE HEPARIN (has tolerated well in the past)  Patient had allergic episode in 1989 after he ate pork at a fair. He developed fever and rash and went to the ED, but he did not have breathing issues. Received heparin in the past. Patient ok with removing pork allergy and listing as intolerance instead.    Shellfish-Derived Products Rash    Per ED note 01/2021   Review of Systems  Unable to perform ROS: Intubated   Physical Exam Vitals and  nursing note reviewed.  Constitutional:      General: He is not in acute distress.    Appearance: He is ill-appearing.     Interventions: He is sedated and intubated.  Pulmonary:     Effort: No respiratory distress. He is intubated.  Skin:    General: Skin is warm and dry.    Vital Signs: BP (!) 130/52 (BP Location: Right Arm)   Pulse 70   Temp (!) 96.3 F (35.7 C) (Axillary) Comment: warm blankets applied  Resp (!) 24   Ht _0  (1.702 m)   Wt 131.7 kg   SpO2 100%   BMI 45.47 kg/m  Pain Scale: CPOT POSS *See Group Information*: 1-Acceptable,Awake and alert Pain Score: 0-No pain   SpO2: SpO2: 100 % O2 Device:SpO2: 100 % O2 Flow Rate: .O2 Flow Rate (L/min): 5 L/min  IO: Intake/output summary:  Intake/Output Summary (Last 24 hours) at 09/16/2021 1632 Last data filed at 09/16/2021 1625 Gross per 24 hour  Intake 2381.65 ml  Output 1411 ml  Net 970.65 ml    LBM: Last BM Date:  (pta) Baseline Weight: Weight: 132 kg Most recent weight: Weight: 131.7 kg     Palliative Assessment/Data: PPS 10%     Time In: 1700 Time Out: 1830 Time Total: 90 minutes  Greater than 50%  of this time was spent counseling and coordinating care related to the above assessment and plan.  Signed by: Lin Landsman, NP   Please contact Palliative Medicine Team phone at [phone] for questions and concerns.  For individual provider: See Shea Evans

## 2021-09-16 NOTE — Progress Notes (Addendum)
NAME:  [name] [name], MRN:  [id number], DOB:  May 24, 1966, LOS: 4 ADMISSION DATE:  10/02/2021, CONSULTATION DATE:  09/21/2021 REFERRING MD:  Kathrynn Humble CHIEF COMPLAINT: Syncope   History of Present Illness:  55 year old man who presented to [facility] 12/2 via EMS with nausea and vomiting, R sided CP and syncope on day of admission. PMHx significant for ESRD (on HD TTS via LUE AVF) c/b chronic hyperkalemia, advanced CHF, pulmonary hypertension, Afib, DVT (on warfarin).  On EMS evaluation, patient was bradycardic to 30s-40s prompting initiation of transcutaneous pacing (started en route). On ED arrival, arterial line was placed. Labs were notable for trop 4k, K 6.6 (iStat). Ca gluconate and 2 amps bicarb were administered. Levo was initiated. Patient remained dependent on transcutaneous pacing. Cardiology/EP and Nephrology were consulted. Patient was taken to cath lab 12/2 for TVP placement. Temp wires removed 12/5, however patient had recurrence of bradycardia/progressive pauses necessitating repeat TVP placement 12/5PM.  Pertinent Medical History:  ESRD DVT Chronic systolic and diastolic heart failure Multifactorial pulmonary hypertension CAD OSA (not using CPAP)  Significant Hospital Events: Including procedures, antibiotic start and stop dates in addition to other pertinent events   10/03/2021 arterial line placed, transcutaneous pacing, transvenous pacemaker placed in cath lab, Started on iHD 12/3 HD per TTS schedule 12/5 Temp wire pulled, AV conduction improved. L groin sheath removed. Bradycardic with progressive pauses later in the evening prompting replacement of TVP. Intubated for procedure with escalation of pressor/hemodynamic support.  Interim History / Subjective:  Overnight had recurrence of bradycardia and progressive pauses ?Asystolic event, patient remained awake throughout but complained of nausea Taken to cath lab for replacement of TVP Intubated for procedure, remained on vent  post-procedure Required significant escalation of hemodynamic support (dopa, NE) iHD transitioned to CRRT in the setting of hemodynamic instability  Objective:  Blood pressure 137/64, pulse 93, temperature 98 F (36.7 C), temperature source Axillary, resp. rate (!) 30, height 5\' 7"  (1.702 m), weight 131.7 kg, SpO2 100 %.    Vent Mode: PRVC FiO2 (%):  [60 %-100 %] 60 % Set Rate:  [18 bmp-30 bmp] 20 bmp Vt Set:  [530 mL] 530 mL PEEP:  [5 cmH20-10 cmH20] 10 cmH20 Plateau Pressure:  [23 cmH20-27 cmH20] 27 cmH20   Intake/Output Summary (Last 24 hours) at 09/16/2021 1311 Last data filed at 09/16/2021 1200 Gross per 24 hour  Intake 1958.7 ml  Output 742 ml  Net 1216.7 ml    Filed Weights   09/13/21 1140 09/14/21 0500 09/16/21 0400  Weight: 125.5 kg 127 kg 131.7 kg   Physical Examination: General: Acute-on-chronically ill-appearing middle aged man in NAD. HEENT: /AT, anicteric sclera, PERRL 18mm, moist mucous membranes. ETT/OGT in place. Neuro: Sedated. Responds to verbal stimuli. and Withdraws to pain in all 4 extremities. Following commands intermittently. Moves all 4 extremities spontaneously.  CV: RRR with pacemaker, no m/g/r. PULM: Breathing even and unlabored on vent (PEEP 10, FiO2 100%). Lung fields CTAB anteriorly. GI: Obese, soft, nontender, nondistended. Small reducible umbilical hernia. Hypoactive bowel sounds. Extremities: Bilateral symmetric 1+ LE/UE edema noted. RUE with ecchymosis at prior IV site. Skin: Warm/dry, ecchymosis as above. Multiple small dark nodular vs. keloided areas to BLE.  Resolved Hospital Problem List:  Toxic metabolic encephalopathy due to under-dialysis and hypotension  Assessment & Plan:   Cardiogenic shock due to complete heart block with slow escape requiring TVPM and titration of NE NSTEMI  Chronic systolic and diastolic heart failure due to ICM CAD TVP temp wire removed 12/5; unfortunately patient had  recurrence of symptomatic bradycardia  with pauses and required replacement of TVP 12/5PM. Required intubation for procedure and significant escalation of hemodynamic support post-procedure. - Goal MAP > 65 - Continue Levophed titrated to MAP goal - Continue dopamine, may consider dobutamine vs. milrinone as alternative - TVP replaced 12/5PM - Rediscuss patient candidacy for permanent pacemaker, given ongoing AV nodal dysfunction - Unclear if this would be a long term solution for recurrence in the setting of chronic R heart failure - Continue Brilinta, atorvastatin - No BB in the setting of hypotension - Consider Advanced HF consult for further evaluation of long term options  Persistent atrial fibrillation  DVT RUL segmental PE CTA Chest 12/3 with isolated segmental PE in RUL. On Warfarin as outpatient. - Continue heparin gtt - Continue amiodarone - Cardiac monitoring  Acute-on-chronic hypoxemic respiratory failure requiring mechanical ventilation OSA, untreated Likely OHS Patient has OSA, but does not utilize CPAP as an outpatient. Likely some degree of OHS. - Continue full vent support (4-8cc/kg IBW) - Increased PEEP in the setting of body habitus/chest wall compliance - Wean FiO2 for O2 sat > 90% - Daily WUA/SBT - VAP bundle - Pulmonary hygiene - PAD protocol for sedation: Fentanyl for goal RASS 0 to -1 - F/u 12/6PM ABG  ESRD with chronic hyperkalemia, dialysis-dependent Chronic hypotension Complication of LUE AVF - Nephrology following, appreciate assistance - iHD transitioned to CRRT in the setting of hemodynamic instability - LUE AVF nonfunctioning, eventual fistulagram/declot vs. TDC once more hemodynamically stable - Trend BMP - Replete electrolytes as indicated - Monitor I&Os - F/u urine studies - Avoid nephrotoxic agents as able - Ensure adequate renal perfusion - Continue midodrine TID in addition to vasopressors  DM type 2  - uncontrolled  - Basal coverage - SSI - Will likely need TF coverage  once initiated  At risk for malnutrition - Nutrition consult for TF once on less pressor support  Best Practice: (right click and "Reselect all SmartList Selections" daily)   Diet/type: Regular consistency (see orders) DVT prophylaxis: systemic heparin GI prophylaxis: PPI Lines: Central line Foley:  N/A Code Status: full code Last date of multidisciplinary goals of care discussion [will attempt to reach out to family today]  Critical care time: 40 minutes   Lestine Mount, PA-C [facility] 09/16/21 1:11 PM  Please see [email or web address] for pager details.  From 7A-7P if no response, please call [phone] After hours, please call ELink [phone]

## 2021-09-16 NOTE — Progress Notes (Signed)
Inpatient Diabetes Program Recommendations  AACE/ADA: New Consensus Statement on Inpatient Glycemic Control (2015)  Target Ranges:  Prepandial:   less than 140 mg/dL      Peak postprandial:   less than 180 mg/dL (1-2 hours)      Critically ill patients:  140 - 180 mg/dL   Lab Results  Component Value Date   GLUCAP 227 (H) 09/16/2021    Review of Glycemic Control  Latest Reference Range & Units 09/28/2021 11:29 09/28/2021 11:59 09/29/2021 13:57 10/03/2021 16:10 09/14/2021 20:24 09/21/2021 22:40 09/16/21 08:18  Glucose-Capillary 70 - 99 mg/dL 71 96 265 (H) 80 168 (H) 155 (H) 227 (H)   Diabetes history: DM 2 Outpatient Diabetes medications:   Current orders for Inpatient glycemic control:  Novolog resistant tid with meals Novolog 4 units tid with meals Semglee 70 units q HS  Inpatient Diabetes Program Recommendations:    Please consider increasing frequency of Novolog correction to q 4 hours but reduce to Novolog sensitive.  D/c meal coverage for now (may need tube feed coverage if it is added today).  Also may need reduction in Hansen.   Thanks,  Adah Perl, RN, BC-ADM Inpatient Diabetes Coordinator Pager [phone]  (8a-5p)

## 2021-09-16 NOTE — Telephone Encounter (Signed)
F/u with [employer] to verify if pt has received home INR machine. Per Lea, at [facility], the training has tried on multiple occasions to reach the pt to try training again but no answer and VM was full. Pt can call trainer at [phone] or the main office number at [phone], option 1.  Tried to contact pt but no answer and mailbox is still full.

## 2021-09-16 NOTE — Plan of Care (Signed)

## 2021-09-16 NOTE — Plan of Care (Signed)
  Problem: Education: Goal: Knowledge of General Education information will improve Description: Including pain rating scale, medication(s)/side effects and non-pharmacologic comfort measures Outcome: Progressing   Problem: Health Behavior/Discharge Planning: Goal: Ability to manage health-related needs will improve Outcome: Progressing   Problem: Clinical Measurements: Goal: Ability to maintain clinical measurements within normal limits will improve Outcome: Progressing Goal: Will remain free from infection Outcome: Progressing Goal: Diagnostic test results will improve Outcome: Progressing Goal: Respiratory complications will improve Outcome: Progressing Goal: Cardiovascular complication will be avoided Outcome: Progressing   Problem: Nutrition: Goal: Adequate nutrition will be maintained Outcome: Progressing   Problem: Activity: Goal: Risk for activity intolerance will decrease Outcome: Progressing   Problem: Coping: Goal: Level of anxiety will decrease Outcome: Progressing   Problem: Elimination: Goal: Will not experience complications related to bowel motility Outcome: Progressing Goal: Will not experience complications related to urinary retention Outcome: Progressing   Problem: Pain Managment: Goal: General experience of comfort will improve Outcome: Progressing   Problem: Safety: Goal: Ability to remain free from injury will improve Outcome: Progressing   Problem: Activity: Goal: Ability to tolerate increased activity will improve Outcome: Progressing

## 2021-09-16 NOTE — Progress Notes (Signed)
Updated friend Mujahid via phone. He is coming to visit tonight and he will update his family.  Brother in [place] Sister in [place] Mother in [place]  Shavonta Gossen P Jamerius Boeckman, [place] 09/16/21 4:48 PM [facility]

## 2021-09-16 NOTE — Progress Notes (Signed)
ANTICOAGULATION CONSULT NOTE   Pharmacy Consult:  Heparin Indication: atrial fibrillation  Allergies  Allergen Reactions   Ace Inhibitors Cough   Pork-Derived Products Other (See Comments)    Entry determined to be clinically insignificant: OK TO GIVE HEPARIN (has tolerated well in the past)  Patient had allergic episode in 1989 after he ate pork at a fair. He developed fever and rash and went to the ED, but he did not have breathing issues. Received heparin in the past. Patient ok with removing pork allergy and listing as intolerance instead.    Shellfish-Derived Products Rash    Per ED note 01/2021    Patient Measurements: Height: 5\' 7"  (170.2 cm) Weight: 131.7 kg (290 lb 5.5 oz) IBW/kg (Calculated) : 66.1 Heparin Dosing Weight: 104 kg  Vital Signs: Temp: 99.1 F (37.3 C) (12/06 0800) Temp Source: Axillary (12/06 0400) BP: 114/49 (12/06 0900) Pulse Rate: 59 (12/06 0900)  Labs: Recent Labs    09/16/2021 0447 09/25/2021 2230 09/20/2021 2241 09/16/21 0016 09/16/21 0108 09/16/21 0120 09/16/21 0431 09/16/21 0818  HGB 9.1* 10.2*   < > 12.2*  --  11.9* 10.6*  --   HCT 30.6* 33.1*   < > 36.0*  --  35.0* 35.4*  --   PLT 259 280  --   --   --   --  380  --   APTT  --  >200*  --   --   --   --   --   --   HEPARINUNFRC 0.41  --   --   --  0.23*  --   --  0.59  CREATININE 8.67* 9.44*  --   --   --   --  10.28*  --    < > = values in this interval not displayed.     Estimated Creatinine Clearance: 10.4 mL/min (A) (by C-G formula based on SCr of 10.28 mg/dL (H)).   Assessment: 54 YOM presented with nausea and vomiting for 2 days and syncope.  Patient was hypotensive and bradycardic, requiring transcutaneous pacing.  S/p cath with temporary transvenous pacemaker insertion.  Pharmacy consulted to start IV heparin while Coumadin is on hold.  INR is sub-therapeutic on admit.  Pt noted to have PE in CT chest. Heparin level came back therapeutic at 0.59, on 2150 units/hr. Hgb 10.6, plt  380. No s/sx of bleeding or infusion issues.   Goal of Therapy:  Heparin level 0.3-0.7 units/ml Monitor platelets by anticoagulation protocol: Yes   Plan:  Continue IV heparin at 2150 units/hr. Daily heparin level and CBC.  Antonietta Jewel, PharmD, [place] Clinical Pharmacist  Phone: [phone] 09/16/2021 9:17 AM  Please check AMION for all [place] phone numbers After 10:00 PM, call [place] [phone]

## 2021-09-16 NOTE — Progress Notes (Addendum)
Progress Note  Patient Name: [name] [name] Date of Encounter: 09/16/2021  [facility] Cardiologist: None Dr. Julianne Handler  Subjective   Intubated and sedated, R groin site is stable  Inpatient Medications    Scheduled Meds:  amiodarone  200 mg Oral Daily   aspirin  81 mg Oral Daily   atorvastatin  80 mg Oral Daily   chlorhexidine gluconate (MEDLINE KIT)  15 mL Mouth Rinse BID   Chlorhexidine Gluconate Cloth  6 each Topical Q0600   cinacalcet  30 mg Oral Q breakfast   clonazePAM  0.5 mg Oral BID   fentaNYL (SUBLIMAZE) injection  12.5 mcg Intravenous Once   ferric citrate  420 mg Oral With snacks   ferric citrate  630 mg Oral TID WC   insulin aspart  0-20 Units Subcutaneous TID WC   insulin aspart  4 Units Subcutaneous TID WC   insulin glargine-yfgn  70 Units Subcutaneous QHS   mouth rinse  15 mL Mouth Rinse 10 times per day   midodrine  10 mg Oral TID WC   ondansetron (ZOFRAN) IV  4 mg Intravenous Once   pantoprazole  40 mg Oral Daily   patiromer  8.4 g Oral Q24H   rOPINIRole  0.25 mg Oral QHS   sevelamer carbonate  1,600 mg Oral With snacks   sevelamer carbonate  3,200 mg Oral TID WC   sodium chloride flush  3 mL Intravenous Q12H   Thrombi-Pad  1 each Topical STAT   ticagrelor  90 mg Oral BID   Continuous Infusions:  sodium chloride Stopped (09/13/21 1550)   sodium chloride     sodium chloride 10 mL/hr at 09/16/21 0900   sodium chloride 10 mL/hr at 09/16/21 0900   DOPamine     DOPamine 15 mcg/kg/min (09/16/21 0900)   fentaNYL infusion INTRAVENOUS 100 mcg/hr (09/16/21 0900)   heparin 2,150 Units/hr (09/16/21 0937)   midazolam 4 mg/hr (09/16/21 0900)   norepinephrine (LEVOPHED) Adult infusion 22 mcg/min (09/16/21 0900)   prismasol BGK 0/2.5 2,000 mL/hr at 09/16/21 0752   prismasol BGK 2/2.5 replacement solution 400 mL/hr at 09/16/21 0751   prismasol BGK 2/2.5 replacement solution 300 mL/hr at 09/16/21 0752   PRN Meds: sodium chloride, sodium chloride, sodium  chloride, sodium chloride, acetaminophen, alteplase, docusate sodium, heparin, lidocaine (PF), lidocaine-prilocaine, meclizine, nitroGLYCERIN, ondansetron (ZOFRAN) IV, pentafluoroprop-tetrafluoroeth, polyethylene glycol, sodium chloride flush, sodium chloride flush   Vital Signs    Vitals:   09/16/21 0800 09/16/21 0815 09/16/21 0830 09/16/21 0900  BP: (!) 114/43   (!) 114/49  Pulse: 61 60 (!) 55 (!) 59  Resp:      Temp: 99.1 F (37.3 C)     TempSrc:      SpO2: 100% 100% 100% 100%  Weight:      Height:        Intake/Output Summary (Last 24 hours) at 09/16/2021 0942 Last data filed at 09/16/2021 0900 Gross per 24 hour  Intake 1538.08 ml  Output 127 ml  Net 1411.08 ml   Last 3 Weights 09/16/2021 09/14/2021 09/13/2021  Weight (lbs) 290 lb 5.5 oz 279 lb 15.8 oz 276 lb 10.8 oz  Weight (kg) 131.7 kg 127 kg 125.5 kg      Telemetry    SR 1st degree AVB >> CHB/pauses > V paced,, no cinduction under the pacer this AM, checked by Dr. Curt Bears - Personally Reviewed  ECG    No new EKGs - Personally Reviewed  Physical Exam   GEN: sedated  Neck: No JVD Cardiac: RRR, no murmurs, rubs, or gallops.  Respiratory: CTA b/l (ant/lat auscultation only), intubated GI: Soft, nontender, obese MS: No edema; No deformity. Neuro:  unable to assess Psych: unable to assess   R groin is soft, stable,  no hematoma  Labs    High Sensitivity Troponin:   Recent Labs  Lab 09/24/2021 1425 09/23/2021 2050  TROPONINIHS 4,493* 5,596*     Chemistry Recent Labs  Lab 09/28/2021 1425 09/17/2021 1430 09/14/2021 1432 09/13/21 0632 09/14/21 0500 09/29/2021 0447 09/14/2021 2230 09/20/2021 2241 09/16/21 0016 09/16/21 0120 09/16/21 0431  NA 132*  --    < > 134*   < > 132* 132*   < > 132* 130* 129*  K 6.0*  --    < > 5.5*   < > 5.2* 5.6*   < > 5.7* 6.3* 6.5*  CL 92*  --    < > 95*   < > 97* 99  --   --   --  94*  CO2 25  --    < > 28   < > 27 21*  --   --   --  18*  GLUCOSE 425*  --    < > 305*   < > 137* 177*   --   --   --  259*  BUN 35*  --    < > 43*   < > 45* 51*  --   --   --  56*  CREATININE 8.10*  --    < > 8.69*   < > 8.67* 9.44*  --   --   --  10.28*  CALCIUM 9.6  --    < > 8.6*   < > 8.4* 8.3*  --   --   --  8.4*  MG  --  2.6*  --  2.6*  --   --   --   --   --   --   --   PROT 7.6  --   --   --   --   --   --   --   --   --   --   ALBUMIN 3.4*  --   --   --   --  2.7*  --   --   --   --  2.9*  AST 37  --   --   --   --   --   --   --   --   --   --   ALT 15  --   --   --   --   --   --   --   --   --   --   ALKPHOS 62  --   --   --   --   --   --   --   --   --   --   BILITOT 1.6*  --   --   --   --   --   --   --   --   --   --   GFRNONAA 7*  --    < > 7*   < > 7* 6*  --   --   --  5*  ANIONGAP 15  --    < > 11   < > 8 12  --   --   --  17*   < > = values in this interval not displayed.  Lipids No results for input(s): CHOL, TRIG, HDL, LABVLDL, LDLCALC, CHOLHDL in the last 168 hours.  Hematology Recent Labs  Lab 09/18/2021 0447 09/24/2021 2230 09/14/2021 2241 09/16/21 0016 09/16/21 0120 09/16/21 0431  WBC 8.2 9.2  --   --   --  14.2*  RBC 3.06* 3.31*  --   --   --  3.53*  HGB 9.1* 10.2*   < > 12.2* 11.9* 10.6*  HCT 30.6* 33.1*   < > 36.0* 35.0* 35.4*  MCV 100.0 100.0  --   --   --  100.3*  MCH 29.7 30.8  --   --   --  30.0  MCHC 29.7* 30.8  --   --   --  29.9*  RDW 22.0* 22.4*  --   --   --  22.4*  PLT 259 280  --   --   --  380   < > = values in this interval not displayed.   Thyroid No results for input(s): TSH, FREET4 in the last 168 hours.  BNP Recent Labs  Lab 10/02/2021 2020  BNP 3,842.7*    DDimer No results for input(s): DDIMER in the last 168 hours.   Radiology      Cardiac Studies    09/14/21: TTE IMPRESSIONS   1. Technically difficult study despite use of definity contrast due to  off axis views and poor visualization of all endocardial segments. Left  ventricular ejection fraction, by estimation, is 50 to 55%. The left  ventricle has low normal  function. Left  ventricular endocardial border not optimally defined to evaluate regional  wall motion. The left ventricular internal cavity size was mildly dilated.  There is mild asymmetric left ventricular hypertrophy of the basal-septal  segment. Left ventricular  diastolic parameters are consistent with Grade III diastolic dysfunction  (restrictive).   2. Right ventricular systolic function is normal. The right ventricular  size is normal.   3. Left atrial size was moderately dilated.   4. Right atrial size was mild to moderately dilated.   5. The mitral valve is degenerative. Mild mitral valve regurgitation.  Moderate to severe mitral annular calcification.   6. The aortic valve is tricuspid. There is moderate calcification of the  aortic valve. There is moderate thickening of the aortic valve. Aortic  valve regurgitation is mild. Mild aortic valve stenosis.   7. The inferior vena cava is dilated in size with <50% respiratory  variability, suggesting right atrial pressure of 15 mmHg.   8. A temporary pacing wire is visualized in the IVC and RV.   Patient Profile     55 y.o. male  with a history of ESRD on HD, COPD, AI, RBBB, OSA, DMII, persistent atrial flutter, chronic OAC on coumadin, chronic DVT, chronic diastolic CHF, h/o endocarditis in 2016, CAD (occlusion of the LAD beyond the stented segment. DES placed)  Hx of August 2022 for creation of left arm dialysis fistula. He had a PEA arrest following the procedure felt to be due to hypercarbia. He developed atrial fib with RVR during the hospitalization and had a TEE guided cardioversion  Pt previously seen by EP team 07/2020 for AFL with slow ventricular rate and intermittent AV block.  His rates improved to 50-60s on dopamine and he ultimately was weaned off and remained stable. He has bilateral fistulas and history of bacteremia and was felt to overall be a poor PPM candidate, though rates improved during that visit as his acute  illness did, with no  urgent indication for pacing.   Admitted this time with several days of R sided chest discomfort, nausea and vomiting.  Today he had 2 syncopal episodes. Called EMS and found to have HR in the 30-40s with intermittent AV block of unclear degree (at least 2nd) per report. long periods of ventricular standstill noted on admission with syncope.  Temp pacing wire placed, suspect again 2/2 acute illness/electrolyte impbalance, very poor PPM candidate  Assessment & Plan    1.  Complete heart block Clinically improved with dialysis regaining 1:1 AV conduction and his temp pacing wire removed yesterday AM  Last night developed CHB again with pausing and required percutaneous pacing > brought back to the cath lab for insertion of new tem pacing wire He was also intubated   2. NSTEMI Likely due to acute medical illness No plans for cath at this time On Brilinta, statin no BB with bradycardia/hypotension   3. Prior DVT On heparin per primary team Warfarin outpt   4. H/o afib maintaining sinus rhythm On amiodarone   5. Chronic systolic dysfunction Volume managed with HD May benefit from general cardiology management as he improves   6. ESRD/ hyperkalemia CRRT started this AM   7. Hypotension On midodrine out pt and here Has required levophed (even with paced/normal rates prior to last night) On dopamine as well now Continue with nephrology/CCM  8. Respiratory failure Intubated last night with CHB/brady events C/w CCM   Telemetry, labs, chart reviewed with Dr. Curt Bears He has seen/examined the patient this AM Pt [name] [name] need PPM ([name] [name] need to be a leadless device), timing perhaps in the next couple days, perhaps Thursday/Friday, follow his clinical course starting CRRT this AM, remains on pressors      For questions or updates, please contact [place] Please consult [email or web address] for contact info under        Signed, [name] Jamaica, PA-C   09/16/2021, 9:42 AM    I have seen and examined this patient with [name] [name].  Agree with above, note added to reflect my findings.  Patient unfortunately developed significant bradycardia yesterday.  Was intubated and sedated.  Now with temporary wire back in place.  He continues to have significant electrolyte abnormalities and acidemia.  We Magdaline Zollars continue to treat underlying illnesses.  His conduction did improve when acidemia and electrolyte abnormalities were addressed previously.  We Shamiracle Gorden continue to monitor for need for pacemaker.  GEN: Obese, intubated, sedated  HEENT: normal  Neck: no JVD, carotid bruits, or masses Cardiac: RRR; no murmurs, rubs, or gallops,no edema  Respiratory:  clear to auscultation bilaterally, normal work of breathing GI: soft, nontender, nondistended, + BS MS: no deformity or atrophy  Skin: warm and dry, Neuro: Sedated Psych: Sedated  1.  Complete heart block: Improved with dialysis with the patient regaining one-to-one conduction.  He is again developed complete heart block.  He has electrolyte abnormalities as well as significant acidemia.  CRRT started this morning.  We Claudie Rathbone continue to monitor for resumption of conduction and possible need for pacemaker implant.  Horace Wishon M. Bette Brienza MD 09/16/2021 1:40 PM

## 2021-09-17 DIAGNOSIS — R001 Bradycardia, unspecified: Secondary | ICD-10-CM | POA: Diagnosis not present

## 2021-09-17 DIAGNOSIS — R570 Cardiogenic shock: Secondary | ICD-10-CM | POA: Diagnosis not present

## 2021-09-17 DIAGNOSIS — I442 Atrioventricular block, complete: Secondary | ICD-10-CM | POA: Diagnosis not present

## 2021-09-17 DIAGNOSIS — N186 End stage renal disease: Secondary | ICD-10-CM

## 2021-09-17 DIAGNOSIS — I214 Non-ST elevation (NSTEMI) myocardial infarction: Secondary | ICD-10-CM | POA: Diagnosis not present

## 2021-09-17 DIAGNOSIS — J9601 Acute respiratory failure with hypoxia: Secondary | ICD-10-CM | POA: Diagnosis not present

## 2021-09-17 DIAGNOSIS — Z9911 Dependence on respirator [ventilator] status: Secondary | ICD-10-CM | POA: Diagnosis not present

## 2021-09-17 DIAGNOSIS — R638 Other symptoms and signs concerning food and fluid intake: Secondary | ICD-10-CM

## 2021-09-17 LAB — RENAL FUNCTION PANEL
Albumin: 2.7 g/dL — ABNORMAL LOW (ref 3.5–5.0)
Albumin: 2.6 g/dL — ABNORMAL LOW (ref 3.5–5.0)
Albumin: 2.4 g/dL — ABNORMAL LOW (ref 3.5–5.0)
Anion gap: 10 (ref 5–15)
Anion gap: 10 (ref 5–15)
Anion gap: 12 (ref 5–15)
BUN: 31 mg/dL — ABNORMAL HIGH (ref 6–20)
BUN: 29 mg/dL — ABNORMAL HIGH (ref 6–20)
BUN: 20 mg/dL (ref 6–20)
CO2: 24 mmol/L (ref 22–32)
CO2: 24 mmol/L (ref 22–32)
CO2: 23 mmol/L (ref 22–32)
Calcium: 8.3 mg/dL — ABNORMAL LOW (ref 8.9–10.3)
Calcium: 8.1 mg/dL — ABNORMAL LOW (ref 8.9–10.3)
Calcium: 8.3 mg/dL — ABNORMAL LOW (ref 8.9–10.3)
Chloride: 98 mmol/L (ref 98–111)
Chloride: 97 mmol/L — ABNORMAL LOW (ref 98–111)
Chloride: 98 mmol/L (ref 98–111)
Creatinine, Ser: 5.46 mg/dL — ABNORMAL HIGH (ref 0.61–1.24)
Creatinine, Ser: 5.16 mg/dL — ABNORMAL HIGH (ref 0.61–1.24)
Creatinine, Ser: 3.78 mg/dL — ABNORMAL HIGH (ref 0.61–1.24)
GFR, Estimated: 11 mL/min — ABNORMAL LOW (ref >60)
GFR, Estimated: 12 mL/min — ABNORMAL LOW (ref >60)
GFR, Estimated: 18 mL/min — ABNORMAL LOW (ref >60)
Glucose, Bld: 57 mg/dL — ABNORMAL LOW (ref 70–99)
Glucose, Bld: 108 mg/dL — ABNORMAL HIGH (ref 70–99)
Glucose, Bld: 106 mg/dL — ABNORMAL HIGH (ref 70–99)
Phosphorus: 3.9 mg/dL (ref 2.5–4.6)
Phosphorus: 3.6 mg/dL (ref 2.5–4.6)
Phosphorus: 2.8 mg/dL (ref 2.5–4.6)
Potassium: 4.1 mmol/L (ref 3.5–5.1)
Potassium: 4 mmol/L (ref 3.5–5.1)
Potassium: 4.6 mmol/L (ref 3.5–5.1)
Sodium: 132 mmol/L — ABNORMAL LOW (ref 135–145)
Sodium: 131 mmol/L — ABNORMAL LOW (ref 135–145)
Sodium: 133 mmol/L — ABNORMAL LOW (ref 135–145)

## 2021-09-17 LAB — POCT I-STAT 7, (LYTES, BLD GAS, ICA,H+H)
Acid-Base Excess: 1 mmol/L (ref 0.0–2.0)
Bicarbonate: 25.4 mmol/L (ref 20.0–28.0)
Calcium, Ion: 1.12 mmol/L — ABNORMAL LOW (ref 1.15–1.40)
HCT: 35 % — ABNORMAL LOW (ref 39.0–52.0)
Hemoglobin: 11.9 g/dL — ABNORMAL LOW (ref 13.0–17.0)
O2 Saturation: 95 %
Potassium: 4 mmol/L (ref 3.5–5.1)
Sodium: 135 mmol/L (ref 135–145)
TCO2: 27 mmol/L (ref 22–32)
pCO2 arterial: 39.3 mmHg (ref 32.0–48.0)
pH, Arterial: 7.418 (ref 7.350–7.450)
pO2, Arterial: 72 mmHg — ABNORMAL LOW (ref 83.0–108.0)

## 2021-09-17 LAB — MAGNESIUM
Magnesium: 2.3 mg/dL (ref 1.7–2.4)
Magnesium: 2.3 mg/dL (ref 1.7–2.4)

## 2021-09-17 LAB — PROCALCITONIN: Procalcitonin: 8.76 ng/mL

## 2021-09-17 LAB — GLUCOSE, CAPILLARY
Glucose-Capillary: 68 mg/dL — ABNORMAL LOW (ref 70–99)
Glucose-Capillary: 98 mg/dL (ref 70–99)
Glucose-Capillary: 77 mg/dL (ref 70–99)
Glucose-Capillary: 85 mg/dL (ref 70–99)
Glucose-Capillary: 113 mg/dL — ABNORMAL HIGH (ref 70–99)
Glucose-Capillary: 122 mg/dL — ABNORMAL HIGH (ref 70–99)

## 2021-09-17 LAB — CULTURE, BLOOD (ROUTINE X 2)
Culture: NO GROWTH
Culture: NO GROWTH
Special Requests: ADEQUATE

## 2021-09-17 LAB — CBC
HCT: 33.2 % — ABNORMAL LOW (ref 39.0–52.0)
Hemoglobin: 10.3 g/dL — ABNORMAL LOW (ref 13.0–17.0)
MCH: 29.9 pg (ref 26.0–34.0)
MCHC: 31 g/dL (ref 30.0–36.0)
MCV: 96.2 fL (ref 80.0–100.0)
Platelets: 312 10*3/uL (ref 150–400)
RBC: 3.45 MIL/uL — ABNORMAL LOW (ref 4.22–5.81)
RDW: 23.2 % — ABNORMAL HIGH (ref 11.5–15.5)
WBC: 9.5 10*3/uL (ref 4.0–10.5)
nRBC: 0.4 % — ABNORMAL HIGH (ref 0.0–0.2)

## 2021-09-17 LAB — COMPREHENSIVE METABOLIC PANEL
ALT: 35 U/L (ref 0–44)
AST: 49 U/L — ABNORMAL HIGH (ref 15–41)
Albumin: 2.7 g/dL — ABNORMAL LOW (ref 3.5–5.0)
Alkaline Phosphatase: 57 U/L (ref 38–126)
Anion gap: 9 (ref 5–15)
BUN: 31 mg/dL — ABNORMAL HIGH (ref 6–20)
CO2: 24 mmol/L (ref 22–32)
Calcium: 8.3 mg/dL — ABNORMAL LOW (ref 8.9–10.3)
Chloride: 99 mmol/L (ref 98–111)
Creatinine, Ser: 5.55 mg/dL — ABNORMAL HIGH (ref 0.61–1.24)
GFR, Estimated: 11 mL/min — ABNORMAL LOW (ref >60)
Glucose, Bld: 57 mg/dL — ABNORMAL LOW (ref 70–99)
Potassium: 4.1 mmol/L (ref 3.5–5.1)
Sodium: 132 mmol/L — ABNORMAL LOW (ref 135–145)
Total Bilirubin: 1.8 mg/dL — ABNORMAL HIGH (ref 0.3–1.2)
Total Protein: 6.8 g/dL (ref 6.5–8.1)

## 2021-09-17 LAB — MRSA NEXT GEN BY PCR, NASAL: MRSA by PCR Next Gen: NOT DETECTED

## 2021-09-17 LAB — HEPARIN LEVEL (UNFRACTIONATED): Heparin Unfractionated: 0.43 IU/mL (ref 0.30–0.70)

## 2021-09-17 MED ORDER — DEXTROSE 50 % IV SOLN
1.0000 | Freq: Once | INTRAVENOUS | Status: AC
  Administered 2021-09-17: 50 mL via INTRAVENOUS

## 2021-09-17 MED ORDER — DEXTROSE 50 % IV SOLN
INTRAVENOUS | Status: AC
  Filled 2021-09-17: qty 50

## 2021-09-17 MED ORDER — INSULIN ASPART 100 UNIT/ML IJ SOLN
0.0000 [IU] | INTRAMUSCULAR | Status: DC
  Administered 2021-09-17: 1 [IU] via SUBCUTANEOUS
  Administered 2021-09-18: 2 [IU] via SUBCUTANEOUS
  Administered 2021-09-18: 1 [IU] via SUBCUTANEOUS
  Administered 2021-09-18: 2 [IU] via SUBCUTANEOUS
  Administered 2021-09-18: 3 [IU] via SUBCUTANEOUS
  Administered 2021-09-18: 1 [IU] via SUBCUTANEOUS
  Administered 2021-09-19: 2 [IU] via SUBCUTANEOUS
  Administered 2021-09-19: 3 [IU] via SUBCUTANEOUS
  Administered 2021-09-19 (×4): 2 [IU] via SUBCUTANEOUS
  Administered 2021-09-20: 7 [IU] via SUBCUTANEOUS
  Administered 2021-09-20 (×2): 5 [IU] via SUBCUTANEOUS
  Administered 2021-09-20: 2 [IU] via SUBCUTANEOUS
  Administered 2021-09-20 – 2021-09-21 (×3): 5 [IU] via SUBCUTANEOUS
  Administered 2021-09-21 (×2): 3 [IU] via SUBCUTANEOUS
  Administered 2021-09-21: 9 [IU] via SUBCUTANEOUS
  Administered 2021-09-21: 5 [IU] via SUBCUTANEOUS
  Administered 2021-09-22: 7 [IU] via SUBCUTANEOUS
  Administered 2021-09-22: 5 [IU] via SUBCUTANEOUS

## 2021-09-17 MED ORDER — SODIUM CHLORIDE 0.9 % IV SOLN
2.0000 g | Freq: Two times a day (BID) | INTRAVENOUS | Status: DC
  Administered 2021-09-17 – 2021-09-22 (×11): 2 g via INTRAVENOUS
  Filled 2021-09-17 (×11): qty 2

## 2021-09-17 MED ORDER — AMIODARONE LOAD VIA INFUSION
150.0000 mg | Freq: Once | INTRAVENOUS | Status: AC
  Filled 2021-09-17: qty 83.34

## 2021-09-17 MED ORDER — SODIUM CHLORIDE 0.9 % IV SOLN
2.0000 g | INTRAVENOUS | Status: DC
Start: 2021-09-17 — End: 2021-09-17

## 2021-09-17 MED ORDER — AMIODARONE HCL IN DEXTROSE 360-4.14 MG/200ML-% IV SOLN
INTRAVENOUS | Status: AC
  Administered 2021-09-17: 150 mg via INTRAVENOUS
  Filled 2021-09-17: qty 200

## 2021-09-17 MED ORDER — AMIODARONE HCL IN DEXTROSE 360-4.14 MG/200ML-% IV SOLN
60.0000 mg/h | INTRAVENOUS | Status: AC
  Administered 2021-09-17: 60 mg/h via INTRAVENOUS

## 2021-09-17 MED ORDER — PRISMASOL BGK 4/2.5 32-4-2.5 MEQ/L REPLACEMENT SOLN: Status: DC

## 2021-09-17 MED ORDER — PRISMASOL BGK 4/2.5 32-4-2.5 MEQ/L EC SOLN: Status: DC

## 2021-09-17 MED ORDER — AMIODARONE HCL IN DEXTROSE 360-4.14 MG/200ML-% IV SOLN
30.0000 mg/h | INTRAVENOUS | Status: DC
  Administered 2021-09-17 – 2021-09-22 (×12): 30 mg/h via INTRAVENOUS
  Filled 2021-09-17 (×15): qty 200

## 2021-09-17 MED FILL — Thrombin-Sodium Carboxymethylcellulose-CaCl Pad 3" X 3": CUTANEOUS | Qty: 3 | Status: AC

## 2021-09-17 NOTE — Progress Notes (Signed)
CRRT removal goal: 75-100 per Dr. Carlis Abbott.

## 2021-09-17 NOTE — Plan of Care (Signed)
  Problem: Education: Goal: Knowledge of General Education information will improve Description: Including pain rating scale, medication(s)/side effects and non-pharmacologic comfort measures Outcome: Progressing   Problem: Health Behavior/Discharge Planning: Goal: Ability to manage health-related needs will improve Outcome: Progressing   Problem: Clinical Measurements: Goal: Ability to maintain clinical measurements within normal limits will improve Outcome: Progressing Goal: Will remain free from infection Outcome: Progressing Goal: Diagnostic test results will improve Outcome: Progressing Goal: Respiratory complications will improve Outcome: Progressing Goal: Cardiovascular complication will be avoided Outcome: Progressing   Problem: Activity: Goal: Risk for activity intolerance will decrease Outcome: Progressing   Problem: Nutrition: Goal: Adequate nutrition will be maintained Outcome: Progressing   Problem: Coping: Goal: Level of anxiety will decrease Outcome: Progressing   Problem: Elimination: Goal: Will not experience complications related to bowel motility Outcome: Progressing Goal: Will not experience complications related to urinary retention Outcome: Progressing   Problem: Skin Integrity: Goal: Risk for impaired skin integrity will decrease Outcome: Progressing   Problem: Activity: Goal: Ability to tolerate increased activity will improve Outcome: Progressing

## 2021-09-17 NOTE — Progress Notes (Signed)
Respiratory culture collected, sent to lab.

## 2021-09-17 NOTE — Progress Notes (Signed)
Pharmacy Antibiotic Note  [name] [name] is a 55 y.o. male admitted on 09/13/2021 with pneumonia.  Pharmacy has been consulted for cefepime dosing.  Plan: Cefepime 2gm q12h Patient on CRRT  Height: 5\' 7"  (170.2 cm) Weight: 131 kg (288 lb 12.8 oz) IBW/kg (Calculated) : 66.1  Temp (24hrs), Avg:98.5 F (36.9 C), Min:97.6 F (36.4 C), Max:99.4 F (37.4 C)  Recent Labs  Lab 09/30/2021 2050 09/13/21 0632 09/14/21 0500 09/14/2021 0447 09/23/2021 2230 09/16/21 0431 09/16/21 1153 09/16/21 1505 09/16/21 1614 09/17/21 0038 09/17/21 0317 09/17/21 1600  WBC  --    < > 9.7 8.2 9.2 14.2*  --   --   --   --  9.5  --   CREATININE 8.48*   < > 7.08* 8.67* 9.44* 10.28*   < > 8.37* 7.10* 5.55*  5.46* 5.16* 3.78*  LATICACIDVEN 2.1*  --   --   --   --   --   --   --   --   --   --   --    < > = values in this interval not displayed.    Estimated Creatinine Clearance: 28.1 mL/min (A) (by C-G formula based on SCr of 3.78 mg/dL (H)).    Allergies  Allergen Reactions   Ace Inhibitors Cough   Pork-Derived Products Other (See Comments)    Entry determined to be clinically insignificant: OK TO GIVE HEPARIN (has tolerated well in the past)  Patient had allergic episode in 1989 after he ate pork at a fair. He developed fever and rash and went to the ED, but he did not have breathing issues. Received heparin in the past. Patient ok with removing pork allergy and listing as intolerance instead.    Shellfish-Derived Products Rash    Per ED note 01/2021    Antimicrobials this admission: Cefepime 12/7 >>   Dose adjustments this admission:   Microbiology results: 12/2 BCx: NGTD 12/7 Sputum: IP  12/2 MRSA PCR: negative  Thank you for allowing pharmacy to be a part of this patient's care.  Donnald Garre, PharmD Clinical Pharmacist  Please check AMION for all [place] numbers After 10:00 PM, call [place] [phone]

## 2021-09-17 NOTE — Progress Notes (Signed)
ANTICOAGULATION CONSULT NOTE   Pharmacy Consult:  Heparin Indication: atrial fibrillation  Allergies  Allergen Reactions   Ace Inhibitors Cough   Pork-Derived Products Other (See Comments)    Entry determined to be clinically insignificant: OK TO GIVE HEPARIN (has tolerated well in the past)  Patient had allergic episode in 1989 after he ate pork at a fair. He developed fever and rash and went to the ED, but he did not have breathing issues. Received heparin in the past. Patient ok with removing pork allergy and listing as intolerance instead.    Shellfish-Derived Products Rash    Per ED note 01/2021    Patient Measurements: Height: 5\' 7"  (170.2 cm) Weight: 131 kg (288 lb 12.8 oz) IBW/kg (Calculated) : 66.1 Heparin Dosing Weight: 104 kg  Vital Signs: Temp: 99.4 F (37.4 C) (12/07 0800) Temp Source: Axillary (12/07 0800) BP: 91/64 (12/07 0800) Pulse Rate: 89 (12/07 0800)  Labs: Recent Labs    09/26/2021 2230 09/20/2021 2241 09/16/21 0108 09/16/21 0120 09/16/21 0431 09/16/21 0818 09/16/21 1109 09/16/21 1411 09/16/21 1505 09/16/21 1614 09/17/21 0038 09/17/21 0106 09/17/21 0317  HGB 10.2*   < >  --    < > 10.6*  --    < > 11.2*  --   --   --  11.9* 10.3*  HCT 33.1*   < >  --    < > 35.4*  --    < > 33.0*  --   --   --  35.0* 33.2*  PLT 280  --   --   --  380  --   --   --   --   --   --   --  312  APTT >200*  --   --   --   --   --   --   --   --   --   --   --   --   HEPARINUNFRC  --   --  0.23*  --   --  0.59  --   --   --   --   --   --  0.43  CREATININE 9.44*  --   --   --  10.28*  --    < >  --    < > 7.10* 5.55*  5.46*  --  5.16*   < > = values in this interval not displayed.     Estimated Creatinine Clearance: 20.6 mL/min (A) (by C-G formula based on SCr of 5.16 mg/dL (H)).   Assessment: 33 YOM presented with nausea and vomiting for 2 days and syncope.  Patient was hypotensive and bradycardic, requiring transcutaneous pacing.  S/p cath with temporary  transvenous pacemaker insertion.  Pharmacy consulted to start IV heparin while Coumadin is on hold.  INR is sub-therapeutic on admit.  Pt noted to have PE in CT chest. Heparin level still therapeutic at 0.43, on 2150 units/hr. Hgb 10.3, plt 312. No s/sx of bleeding or infusion issues.   Goal of Therapy:  Heparin level 0.3-0.7 units/ml Monitor platelets by anticoagulation protocol: Yes   Plan:  Continue IV heparin at 2150 units/hr. Daily heparin level and CBC.  Erin Hearing PharmD., BCPS Clinical Pharmacist 09/17/2021 9:41 AM

## 2021-09-17 NOTE — Progress Notes (Signed)
Daily Progress Note   Patient Name: [name] [name]       Date: 09/17/2021 DOB: [birth year]  Age: 55 y.o. MRN#: [id number] Attending Physician: Julian Hy, DO Primary Care Physician: Hoyt Koch, MD Admit Date: 09/23/2021  Reason for Consultation/Follow-up:  Establishing goals of care, "family coordination, sister lives in [place], has friends here, multiple severe comorbidities"  Subjective: Chart review performed. Received report and discussed case with primary RN and Salvadore Dom, NP.  12:57 PM Notified that friend/Ashqer called PMT phone to schedule meeting. Returned call - in person meeting scheduled for today at 3p. Ashqer will be able to get patient's brother on facetime.  3:00 PM  Went to visit patient at bedside - Ashqer was present. Patient was lying in bed intubated and sedated. No signs or non-verbal gestures of pain or discomfort noted. No respiratory distress, increased work of breathing, or secretions noted. Allowed space and time for Ashqer to let brother see patient via facetime before meeting.  Met with friend/Ashqer in person and brother via facetime in conference room  to discuss diagnosis, prognosis, GOC, EOL wishes, disposition, and options.   I introduced Palliative Medicine as specialized medical care for people living with serious illness. It focuses on providing relief from the symptoms and stress of a serious illness. The goal is to improve quality of life for both the patient and the family.  Marni Griffon, NP provided family with medical updates and answered questions. I appreciate his assistance. After receiving updates from [place], allowed space and time for brother and friend to discuss thoughts and feelings about patient's current acute medical  situation.  We discussed patient's current illness and what it means in the larger context of patient's on-going co-morbidities.  Natural disease trajectory and expectations at EOL were discussed. I attempted to elicit values and goals of care important to the patient. The difference between aggressive medical intervention and comfort care was considered in light of the patient's goals of care. Prognostication was reviewed. Patient's brother feels he needs time to process all information given to him today. He is anxious about having to tell his mother and family - offered support, he feels telling his family personally would be best.   Encouraged brother to consider DNR/DNI status understanding evidenced based poor outcomes in similar hospitalized patient, as the cause  of arrest is likely associated with advanced chronic/terminal illness rather than an easily reversible acute cardio-pulmonary event. I explained that DNR/DNI does not change the medical plan and it only comes into effect after a person has arrested (died).  It is a protective measure to keep [place] from harming the patient in their last moments of life. Family were not agreeable to DNR/DNI with understanding that patient would receive CPR, defibrillation, ACLS medications, and/or re-intubation.   Brother's goal is to travel to [place] before making any final decisions - he would like medical team to try and keep patient alive until his arrival. Encouraged him and any other family to come as soon as possible as it is anticipated patient will continue to decline as his prognosis is very poor - he expressed understanding. He hopes to be in the country in the next 2-3 days.  Discussed possibility of extubation pending clinical course and plan to re-intubate vs transition to comfort if patient declines. Brother is hopeful to keep the patient as stable as possible without doing "risky things" (such as extubate) that might possibly put him at higher risk for  passing. If any changes to current care are recommended (extubation, stopping CRRT, etc) he would like to be notified.   Brother requests patient's friend/Ashqer be primary contact for now while he travels. Ashqer will assist medical team in staying in contact with brother for any needs.  Confirmed brother's international phone number.  Visit also consisted of discussions dealing with the complex and emotionally intense issues of symptom management and palliative care in the setting of serious and potentially life-threatening illness. Palliative care team will continue to support patient, patient's family, and medical team.  Discussed with patient/family the importance of continued conversation with each other and the medical providers regarding overall plan of care and treatment options, ensuring decisions are within the context of the patient's values and GOCs.    Questions and concerns were addressed. The patient/family was encouraged to call with questions and/or concerns. PMT card/number was provided.   Length of Stay: 5  Current Medications: Scheduled Meds:  . aspirin  81 mg Per Tube Daily  . atorvastatin  80 mg Per Tube Daily  . B-complex with vitamin C  1 tablet Per Tube Daily  . chlorhexidine gluconate (MEDLINE KIT)  15 mL Mouth Rinse BID  . Chlorhexidine Gluconate Cloth  6 each Topical Q0600  . clonazePAM  0.5 mg Per Tube BID  . dextrose      . docusate  100 mg Per Tube BID  . insulin aspart  0-9 Units Subcutaneous Q4H  . mouth rinse  15 mL Mouth Rinse 10 times per day  . midodrine  10 mg Per Tube TID WC  . ondansetron (ZOFRAN) IV  4 mg Intravenous Once  . pantoprazole sodium  40 mg Per Tube Daily  . polyethylene glycol  17 g Per Tube Daily  . rOPINIRole  0.25 mg Per Tube QHS  . sodium chloride flush  3 mL Intravenous Q12H  . ticagrelor  90 mg Per Tube BID    Continuous Infusions: .  prismasol BGK 4/2.5 400 mL/hr at 09/17/21 0400  .  prismasol BGK 4/2.5 300 mL/hr at  09/17/21 0400  . sodium chloride Stopped (09/13/21 1550)  . sodium chloride    . sodium chloride 10 mL/hr at 09/17/21 1200  . sodium chloride 10 mL/hr at 09/17/21 1200  . amiodarone 30 mg/hr (09/17/21 1209)  . DOPamine 5 mcg/kg/min (09/17/21 1200)  .  fentaNYL infusion INTRAVENOUS 150 mcg/hr (09/17/21 1200)  . heparin 2,150 Units/hr (09/17/21 1200)  . midazolam 4 mg/hr (09/17/21 1200)  . norepinephrine (LEVOPHED) Adult infusion 16 mcg/min (09/17/21 1200)  . prismasol BGK 4/2.5 2,000 mL/hr at 09/17/21 1148    PRN Meds: sodium chloride, sodium chloride, sodium chloride, sodium chloride, acetaminophen, alteplase, docusate, fentaNYL (SUBLIMAZE) injection, fentaNYL (SUBLIMAZE) injection, heparin, lidocaine (PF), lidocaine-prilocaine, meclizine, ondansetron (ZOFRAN) IV, pentafluoroprop-tetrafluoroeth, polyethylene glycol, sodium chloride flush, sodium chloride flush  Physical Exam Vitals and nursing note reviewed.  Constitutional:      General: He is not in acute distress.    Appearance: He is ill-appearing.     Interventions: He is sedated and intubated.  Pulmonary:     Effort: No respiratory distress. He is intubated.  Skin:    General: Skin is warm and dry.            Vital Signs: BP (!) 137/52   Pulse 92   Temp 99.4 F (37.4 C) (Axillary)   Resp (!) 26   Ht 5' 7"  (1.702 m)   Wt 131 kg   SpO2 98%   BMI 45.23 kg/m  SpO2: SpO2: 98 % O2 Device: O2 Device: Ventilator O2 Flow Rate: O2 Flow Rate (L/min): 5 L/min  Intake/output summary:  Intake/Output Summary (Last 24 hours) at 09/17/2021 1247 Last data filed at 09/17/2021 1200 Gross per 24 hour  Intake 2636.73 ml  Output 2620 ml  Net 16.73 ml   LBM: Last BM Date:  (pta) Baseline Weight: Weight: 132 kg Most recent weight: Weight: 131 kg       Palliative Assessment/Data: PPS 10%    Flowsheet Rows    Flowsheet Row Most Recent Value  Intake Tab   Referral Department Critical care  Unit at Time of Referral ICU   Palliative Care Primary Diagnosis Cardiac  Date Notified 09/16/21  Palliative Care Type New Palliative care  Reason for referral Clarify Goals of Care, Other (Comment)  ["family coordination, sister lives in [place], has friends here, multiple severe comorbidities"]  Date of Admission 09/11/2021  Date first seen by Palliative Care 09/16/21  # of days Palliative referral response time 0 Day(s)  # of days IP prior to Palliative referral 4  Clinical Assessment   Psychosocial & Spiritual Assessment   Palliative Care Outcomes   Patient/Family meeting held? No  [coordinating with patient's friend to schedule family meeting - family live in [place] and do not speak English]       Patient Active Problem List   Diagnosis Date Noted  . Bradycardia 09/16/2021  . Syncope and collapse     Palliative Care Assessment & Plan   Patient Profile: 56 y.o. male  with past medical history of ESRD on HD, COPD, AI, RBBB, OSA, DMII, persistent atrial flutter, chronic OAC on coumadin, chronic DVT, chronic diastolic CHF, h/o endocarditis in 2016, CAD (occlusion of the LAD beyond the stented segment. DES placed) presented to ED on 10/07/2021 from home after two syncopal episodes and c/o N/V x 2 days. EMS found patient with HR in 30s-40s with prolonged periods of no ventricular response. Patient remained severely bradycardic in ED. Electrophysiology recommended transcutaneous pacing (patient not good candidate for PPM). Nephrology was consulted for emergent dialysis due to hyperkalemia (patient has chronic hyperkalemia outpatient). Patient was  admitted on 10/09/2021 with syncope/ventricular standstill, CAD s/p NSTEMI this admission, acute encephalopathy, unstable bradycardia, cardiogenic shock, acute on chronic respiratory failure, pulmonary edema, hyperkalemia. Patient was improving and temp pacing wire was removed  on 12/5 AM. That night patient developed pausing again, which required another temp pacing wire placement as  well as intubation. Patient is currently intubated and sedated in ICU. Medical team has not had much success getting in contact with patient's next of kin that live out of the country - mother and sister in [place].   Assessment: Acute hypoxic ventilator dependent respiratory failure Concern for possible aspiration pneumonia Complete heart block ESRD Cardiogenic shock Persistent atrial fibrillation Chronic systolic and diastolic heart failure due to ICM Toxic metabolic encephalopathy Concern about end of life  Recommendations/Plan: Continue full code/full scope Goal is to keep patient alive until brother can arrive from [place] in hopefully 2 days pending flights Brother is hopeful to keep the patient as stable as possible without doing "risky things" (such as extubate) that might possibly put him at higher risk for passing. If any changes to current care are recommended (extubation, stopping CRRT, etc) he would like to be notified Brother requests patient's friend/Ashqer be primary contact for now while he travels. Ashqer will assist medical team in staying in contact with brother for any needs Brother's international phone number is: [phone]; however, [facility] [facility] operator was not able to transfer my call per attempt yesterday PMT will shadow for needs and follow up for continued [place] once brother arrives to [facility]. If there are any imminent needs please call the service directly  Goals of Care and Additional Recommendations: Limitations on Scope of Treatment: Full Scope Treatment  Code Status:    Code Status Orders  (From admission, onward)           Start     Ordered   10/02/2021 1645  Full code  Continuous        09/20/2021 1650           Code Status History     This patient has a current code status but no historical code status.       Prognosis:  Poor  Discharge Planning: To Be Determined  Care plan was discussed with primary RN, CCM, patient's friend and  brother  Thank you for allowing the Palliative Medicine Team to assist in the care of this patient.   Total Time 120 minutes Prolonged Time Billed  yes       Greater than 50%  of this time was spent counseling and coordinating care related to the above assessment and plan.  Lin Landsman, NP  Please contact Palliative Medicine Team phone at [phone] for questions and concerns.

## 2021-09-17 NOTE — Progress Notes (Signed)
NAME:  [name] [name], MRN:  [id number], DOB:  10-31-1965, LOS: 5 ADMISSION DATE:  10/03/2021, CONSULTATION DATE:  09/28/2021 REFERRING MD:  Kathrynn Humble CHIEF COMPLAINT: Syncope   History of Present Illness:  55 year old man who presented to [facility] 12/2 via EMS with nausea and vomiting, R sided CP and syncope on day of admission. PMHx significant for ESRD (on HD TTS via LUE AVF) c/b chronic hyperkalemia, advanced CHF, pulmonary hypertension, Afib, DVT (on warfarin).  On EMS evaluation, patient was bradycardic to 30s-40s prompting initiation of transcutaneous pacing (started en route). On ED arrival, arterial line was placed. Labs were notable for trop 4k, K 6.6 (iStat). Ca gluconate and 2 amps bicarb were administered. Levo was initiated. Patient remained dependent on transcutaneous pacing. Cardiology/EP and Nephrology were consulted. Patient was taken to cath lab 12/2 for TVP placement. Temp wires removed 12/5, however patient had recurrence of bradycardia/progressive pauses necessitating repeat TVP placement 12/5PM.  Pertinent Medical History:  ESRD DVT Chronic systolic and diastolic heart failure Multifactorial pulmonary hypertension CAD OSA (not using CPAP)  Significant Hospital Events: Including procedures, antibiotic start and stop dates in addition to other pertinent events   09/11/2021 arterial line placed, transcutaneous pacing, transvenous pacemaker placed in cath lab, Started on iHD 12/3 HD per TTS schedule 12/5 Temp wire pulled, AV conduction improved. L groin sheath removed. Bradycardic with progressive pauses later in the evening prompting replacement of TVP. Intubated for procedure with escalation of pressor/hemodynamic support. 12/8 PCM consulted.  12/7 VT overnight. Required Cardioversion.   Interim History / Subjective:  VT last night   Objective:  Blood pressure (Abnormal) 111/57, pulse 87, temperature 98.9 F (37.2 C), temperature source Oral, resp. rate (Abnormal) 24, height  5\' 7"  (1.702 m), weight 131 kg, SpO2 98 %.    Vent Mode: PRVC FiO2 (%):  [40 %-100 %] 40 % Set Rate:  [24 bmp-30 bmp] 24 bmp Vt Set:  [530 mL-540 mL] 540 mL PEEP:  [8 cmH20-10 cmH20] 10 cmH20 Plateau Pressure:  [24 cmH20-27 cmH20] 24 cmH20   Intake/Output Summary (Last 24 hours) at 09/17/2021 0731 Last data filed at 09/17/2021 0700 Gross per 24 hour  Intake 2969.92 ml  Output 2798 ml  Net 171.92 ml   Filed Weights   09/14/21 0500 09/16/21 0400 09/17/21 0400  Weight: 127 kg 131.7 kg 131 kg   Physical Examination:  General 55 year old male. Currently sedated and on full support HENT NCAT no JVD MMM Pulm coarse bilateral breath sounds. Vent requirements stable.  Card paced.  Abd obese  Ext warm CR brisk  Neuro sedated   Resolved Hospital Problem List:  Toxic metabolic encephalopathy due to under-dialysis and hypotension Hyperkalemia  Assessment & Plan:   Cardiogenic shock due to complete heart block with slow escape requiring TVPM and titration of NE NSTEMI  Chronic systolic and diastolic heart failure due to ICM CAD Plan Cont norepi for MAP goal > 65 Currently on dopamine. Could consider alternative inotrope but will defer to HF as we will be consulting them later this am  Cont TVP (although seems like this is bridge to nowhere as not candidate for PPM) Cont Brilinta and atorvastatin  Holding GDMT given shock state   Persistent atrial fibrillation  VT (12/7)  DVT RUL segmental PE CTA Chest 12/3 with isolated segmental PE in RUL. On Warfarin as outpatient. Plan Cont systemic heparin amiodarone  Acute-on-chronic hypoxemic respiratory failure requiring mechanical ventilation OSA, untreated Likely OHS Patient has OSA, but does not utilize CPAP as  an outpatient. Likely some degree of OHS. ABG reviewed.  Last cxr 12/6 wide mediastinum and vascular congestion/edema  Plan Cont full vent support Holding PEEP at 10 given body habitus  VAP bundle PAD protocol w/  RASS goal -1  ESRD with chronic, dialysis-dependent Chronic hypotension Complication of LUE AVF - Nephrology following, appreciate assistance Plan Cont CRRT (given hemodynamic instability) If he were to survive will need to decide on fistulagram/declot vs TDC Serial chems Replacing and correcting lytes as indicated Cont Midodrine  DM type 2  - uncontrolled  Hypoglycemic overnight; basal was held Plan Dc basal coverage Cont ssi Dc TF coverage  Inadequate oral intake.  H/o MO Seen by RD. Recommending tubefeeds in next 24 hrs or so.  Plan Awaiting advanced HF eval.  Seems like nutritional support least of his worries right now.   Best Practice: (right click and "Reselect all SmartList Selections" daily)   Diet/type: Regular consistency (see orders) DVT prophylaxis: systemic heparin GI prophylaxis: PPI Lines: Central line Foley:  N/A Code Status: full code Last date of multidisciplinary goals of care discussion [will attempt to reach out to family today]  Critical care time: 71 min   Erick Colace ACNP-BC [place] Pager # [phone] OR # [phone] if no answer

## 2021-09-17 NOTE — Progress Notes (Signed)
Family meeting   Met w/ brother via face-time meeting w/ PCM team and the patient's friend. Discussed at length the patient's current medical condition, prognosis and concerns. I shared with him our concern about his brother's advanced cardiomyopathy and the challenges he faces due to: ICM, intolerance of HD, refractory shock and lastly our concern about if we can even keep him off from mechanical ventilation. Only if her were to overcome these obstacles would he then be candidate for leadless pacemaker. At the end of my part of this meeting the brother is now planning on coming to [facility]. He is anticipating a goals of care discussion.   Erick Colace ACNP-BC [place] Pager # [phone] OR # [phone] if no answer

## 2021-09-17 NOTE — Consult Note (Addendum)
Advanced Heart Failure Team Consult Note   Primary Physician: Hoyt Koch, MD PCP-Cardiologist:  None  Reason for Consultation: Cardiogenic shock  HPI:    [name] [name] is seen today for evaluation of cardiogenic shock at the request of Dr. Carlis Abbott with PCCM. 55 y.o. male with history of CAD w/ NSTEMI 10/21 s/p DES to LAD and DES to LAD distal to previously placed stent in 72/25, chronic systolic HF with EF 75-05% in 07/22, hx paroxysmal atrial fibrillation/AFL, chronic DVT, hx endocarditis in 2016, ESRD on HD w/ failed transplant at [facility] in 2009, RBBB, DM II, OSA.  Has hx PEA 08/22 in setting of hypercarbia after L dialysis fistula placement. Had AF during admission and underwent TEE/DCCV. Echo at [facility] 08/22 with LVEF 45-50%, moderate MR.   Admitted 09/29/2021 after 2 syncopal episodes, CP, N, V. EMS called, HR 30s - 40s with intermittent AV block of uncertain degree.  Transcutaneous pacing started en route to hospital. Longer periods of ventricular standstill noted on admit up to 11-12 seconds. K up to 6.6. EP consulted and TVP was placed. Felt to be poor PPM candidate. Acidemia and electrolyte abnormalities contributing to conduction issues.  Initially improved with dialysis. Temp pacing wire removed 12/05. Developed CHB with pauses and new temp pacing wire inserted. K 5.5-5.6 at that time. Intubated prior to procedure. Has been hypotensive. Initially started on NE and later required addition of dopamine.  HS troponin up to 4,000. Medical management recommended.  LUE fistula has clotted. Has R Fem HD cath. Started on CRRT for hyperkalemia.   Had sustained VT around 12:30 am this morning. Pressor requirements increased. Underwent cardioversion. Bolused with IV amio and started on gtt.  Echo 12/04: EF 35-40% on Dr. Clayborne Dana review  Currently intubated and sedated. RN reports he follows commands.  Limited knowledge of his baseline functional status. No family at bedside.  Mother is in [place] and does not speak english. Dr. Haroldine Laws spoke with patient's Nephrologist. Patient is compliant with dialysis suggestions. Unfortunately, has limited social support.   Review of Systems: [y] = yes, [ ]  = no  Unable to assess. Intubated and sedated General: Weight gain [ ] ; Weight loss [ ] ; Anorexia [ ] ; Fatigue [ ] ; Fever [ ] ; Chills [ ] ; Weakness [ ]   Cardiac: Chest pain/pressure [ ] ; Resting SOB [ ] ; Exertional SOB [ ] ; Orthopnea [ ] ; Pedal Edema [ ] ; Palpitations [ ] ; Syncope [ ] ; Presyncope [ ] ; Paroxysmal nocturnal dyspnea[ ]   Pulmonary: Cough [ ] ; Wheezing[ ] ; Hemoptysis[ ] ; Sputum [ ] ; Snoring [ ]   GI: Vomiting[ ] ; Dysphagia[ ] ; Melena[ ] ; Hematochezia [ ] ; Heartburn[ ] ; Abdominal pain [ ] ; Constipation [ ] ; Diarrhea [ ] ; BRBPR [ ]   GU: Hematuria[ ] ; Dysuria [ ] ; Nocturia[ ]   Vascular: Pain in legs with walking [ ] ; Pain in feet with lying flat [ ] ; Non-healing sores [ ] ; Stroke [ ] ; TIA [ ] ; Slurred speech [ ] ;  Neuro: Headaches[ ] ; Vertigo[ ] ; Seizures[ ] ; Paresthesias[ ] ;Blurred vision [ ] ; Diplopia [ ] ; Vision changes [ ]   Ortho/Skin: Arthritis [ ] ; Joint pain [ ] ; Muscle pain [ ] ; Joint swelling [ ] ; Back Pain [ ] ; Rash [ ]   Psych: Depression[ ] ; Anxiety[ ]   Heme: Bleeding problems [ ] ; Clotting disorders [ ] ; Anemia [ ]   Endocrine: Diabetes [ ] ; Thyroid dysfunction[ ]   Home Medications Prior to Admission medications   Medication Sig Start Date End Date Taking? Authorizing Provider  amiodarone (PACERONE)  200 MG tablet Take 200 mg by mouth daily. 08/30/21  Yes [provider]  atorvastatin (LIPITOR) 80 MG tablet Take 80 mg by mouth daily. 08/18/21  Yes [provider]  cinacalcet (SENSIPAR) 30 MG tablet Take 30 mg by mouth daily.   Yes [provider]  clonazePAM (KLONOPIN) 0.5 MG tablet Take 0.5 mg by mouth 3 (three) times daily as needed for anxiety. 09/01/21  Yes [provider]  ferric citrate (AURYXIA) 1 GM 210 MG(Fe)  tablet Take 420-630 mg by mouth See admin instructions. Take 3 tablets (630 mg) by mouth three times daily with meals and 2 tablets (420 mg) with snacks   Yes [provider]  insulin glargine, 2 Unit Dial, (TOUJEO MAX SOLOSTAR) 300 UNIT/ML Solostar Pen Inject 80 Units into the skin 2 (two) times daily.   Yes [provider]  insulin regular human CONCENTRATED (HUMULIN R U-500 KWIKPEN) 500 UNIT/ML KwikPen Inject 50-70 Units into the skin See admin instructions. Inject 60 units subcutaneously with breakfast, 70 units with lunch and 50 units with supper   Yes [provider]  meclizine (ANTIVERT) 12.5 MG tablet Take 12.5 mg by mouth every morning. 09/13/21  Yes [provider]  midodrine (PROAMATINE) 5 MG tablet Take 5 mg by mouth See admin instructions. Take 1 tablet (5 mg) by mouth three times daily, may also take 2 tablets (10 mg) during dialysis - Tuesday, Thursday, Saturday - as needed for low blood pressure 09/07/21  Yes [provider]  mupirocin ointment (BACTROBAN) 2 % Apply 1 application topically 3 (three) times daily as needed (wound care). 04/28/21  Yes [provider]  nitroGLYCERIN (NITROSTAT) 0.4 MG SL tablet Place 0.4 mg under the tongue every 5 (five) minutes as needed for chest pain. 06/04/21  Yes [provider]  pantoprazole (PROTONIX) 40 MG tablet Take 40 mg by mouth at bedtime. 07/21/21  Yes [provider]  patiromer (VELTASSA) 8.4 g packet Take 8.4 g by mouth daily.   Yes [provider]  rOPINIRole (REQUIP) 0.25 MG tablet Take 0.25 mg by mouth at bedtime. 09/10/21  Yes [provider]  sevelamer carbonate (RENVELA) 800 MG tablet Take 1,600-3,200 mg by mouth See admin instructions. Take 4 tablets (3200 mg) by mouth three times daily with meals, take 2 tablets (1600 mg) with snacks   Yes [provider]  silver sulfADIAZINE (SILVADENE) 1 % cream Apply 1 application topically daily as  needed (wound care).   Yes [provider]  ticagrelor (BRILINTA) 90 MG TABS tablet Take by mouth 2 (two) times daily.   Yes [provider]  warfarin (COUMADIN) 5 MG tablet Take 2.5 mg by mouth See admin instructions. Per anti-coag visit 09/21/2021: take 1/2 tablet (2.5 mg) by mouth on Monday, Wednesday, Friday; take 1 tablet (5 mg) on Sunday, Tuesday, Thursday, Saturday. 07/09/21  Yes [provider]    Past Medical History: Past Medical History:  Diagnosis Date   Renal disorder     Past Surgical History: Past Surgical History:  Procedure Laterality Date   ARTERIAL LINE INSERTION N/A 10/07/2021   Procedure: ARTERIAL LINE INSERTION;  Surgeon: Early Osmond, MD;  Location: [place] [facility];  Service: Cardiovascular;  Laterality: N/A;   TEMPORARY PACEMAKER N/A 09/16/2021   Procedure: TEMPORARY PACEMAKER;  Surgeon: Sherren Mocha, MD;  Location: [place] [facility];  Service: Cardiovascular;  Laterality: N/A;   TEMPORARY PACEMAKER N/A 10/04/2021   Procedure: TEMPORARY PACEMAKER;  Surgeon: Early Osmond,  MD;  Location: [place] [facility];  Service: Cardiovascular;  Laterality: N/A;    Family History: History reviewed. No pertinent family history.  Social History: Social History   Socioeconomic History   Marital status: Divorced    Spouse name: Not on file   Number of children: Not on file   Years of education: Not on file   Highest education level: Not on file  Occupational History   Not on file  Tobacco Use   Smoking status: Not on file   Smokeless tobacco: Not on file  Substance and Sexual Activity   Alcohol use: Not on file   Drug use: Not on file   Sexual activity: Not on file  Other Topics Concern   Not on file  Social History Narrative   Not on file   Social Determinants of Health   Financial Resource Strain: Not on file  Food Insecurity: Not on file  Transportation Needs: Not on file  Physical Activity: Not on file  Stress: Not on  file  Social Connections: Not on file    Allergies:  Allergies  Allergen Reactions   Ace Inhibitors Cough   Pork-Derived Products Other (See Comments)    Entry determined to be clinically insignificant: OK TO GIVE HEPARIN (has tolerated well in the past)  Patient had allergic episode in 1989 after he ate pork at a fair. He developed fever and rash and went to the ED, but he did not have breathing issues. Received heparin in the past. Patient ok with removing pork allergy and listing as intolerance instead.    Shellfish-Derived Products Rash    Per ED note 01/2021    Objective:    Vital Signs:   Temp:  [96.3 F (35.7 C)-99.4 F (37.4 C)] 99.4 F (37.4 C) (12/07 0800) Pulse Rate:  [59-99] 89 (12/07 0800) Resp:  [24-30] 24 (12/06 1447) BP: (91-140)/(32-66) 91/64 (12/07 0800) SpO2:  [96 %-100 %] 99 % (12/07 0800) Arterial Line BP: (76-148)/(37-91) 98/41 (12/07 0800) FiO2 (%):  [40 %-60 %] 40 % (12/07 0800) Weight:  [131 kg] 131 kg (12/07 0400) Last BM Date:  (pta)  Weight change: Filed Weights   09/14/21 0500 09/16/21 0400 09/17/21 0400  Weight: 127 kg 131.7 kg 131 kg    Intake/Output:   Intake/Output Summary (Last 24 hours) at 09/17/2021 0937 Last data filed at 09/17/2021 0900 Gross per 24 hour  Intake 2736.65 ml  Output 2671 ml  Net 65.65 ml      Physical Exam    General:  Intubated and sedated HEENT: + ETT Neck: supple. JVD difficult to assess. Carotids 2+ bilat; no bruits.  Cor: PMI nondisplaced. Regular rate & rhythm. No rubs, gallops or murmurs. Lungs: clear anteriorly Abdomen: soft, nontender, nondistended. No hepatosplenomegaly.  Extremities: no cyanosis, clubbing, rash, edema, R femoral HD, L femoral temp pacer Neuro: sedated on vent   Telemetry   V paced 80s-90s   Labs   Basic Metabolic Panel: Recent Labs  Lab 09/30/2021 1430 10/03/2021 1432 09/13/21 0632 09/14/21 0500 09/16/21 0431 09/16/21 1109 09/16/21 1153 09/16/21 1411 09/16/21 1505  09/16/21 1614 09/17/21 0038 09/17/21 0106 09/17/21 0317  NA  --    < > 134*   < > 129*   < > 131*   < > 132* 133* 132*  132* 135 131*  K  --    < > 5.5*   < > 6.5*   < > 4.8   < > 4.8 4.6 4.1  4.1 4.0  4.0  CL  --    < > 95*   < > 94*  --  97*  --  95* 98 99  98  --  97*  CO2  --    < > 28   < > 18*  --  21*  --  21* 22 24  24   --  24  GLUCOSE  --    < > 305*   < > 259*  --  192*  --  173* 145* 57*  57*  --  108*  BUN  --    < > 43*   < > 56*  --  45*  --  48* 40* 31*  31*  --  29*  CREATININE  --    < > 8.69*   < > 10.28*  --  8.14*  --  8.37* 7.10* 5.55*  5.46*  --  5.16*  CALCIUM  --    < > 8.6*   < > 8.4*  --  8.4*  --  8.3* 8.4* 8.3*  8.3*  --  8.1*  MG 2.6*  --  2.6*  --   --   --   --   --   --   --  2.3  --  2.3  PHOS  --   --  7.1*   < > 7.7*  --   --   --  4.7* 4.4 3.9  --  3.6   < > = values in this interval not displayed.    Liver Function Tests: Recent Labs  Lab 09/17/2021 1425 09/18/2021 0447 09/16/21 0431 09/16/21 1505 09/16/21 1614 09/17/21 0038 09/17/21 0317  AST 37  --   --   --   --  49*  --   ALT 15  --   --   --   --  35  --   ALKPHOS 62  --   --   --   --  57  --   BILITOT 1.6*  --   --   --   --  1.8*  --   PROT 7.6  --   --   --   --  6.8  --   ALBUMIN 3.4*   < > 2.9* 2.9* 2.6* 2.7*  2.7* 2.6*   < > = values in this interval not displayed.   No results for input(s): LIPASE, AMYLASE in the last 168 hours. No results for input(s): AMMONIA in the last 168 hours.  CBC: Recent Labs  Lab 10/09/2021 1425 10/04/2021 1436 09/14/21 0500 10/01/2021 0447 10/07/2021 2230 10/07/2021 2241 09/16/21 0431 09/16/21 1109 09/16/21 1411 09/17/21 0106 09/17/21 0317  WBC 10.3   < > 9.7 8.2 9.2  --  14.2*  --   --   --  9.5  NEUTROABS 8.5*  --   --   --   --   --   --   --   --   --   --   HGB 13.1   < > 10.0* 9.1* 10.2*   < > 10.6* 11.9* 11.2* 11.9* 10.3*  HCT 43.2   < > 33.9* 30.6* 33.1*   < > 35.4* 35.0* 33.0* 35.0* 33.2*  MCV 100.5*   < > 100.0 100.0 100.0  --   100.3*  --   --   --  96.2  PLT 266   < > 281 259 280  --  380  --   --   --  312   < > =  values in this interval not displayed.    Cardiac Enzymes: No results for input(s): CKTOTAL, CKMB, CKMBINDEX, TROPONINI in the last 168 hours.  BNP: BNP (last 3 results) Recent Labs    09/23/2021 2020  BNP 3,842.7*    ProBNP (last 3 results) No results for input(s): PROBNP in the last 8760 hours.   CBG: Recent Labs  Lab 09/16/21 1941 09/16/21 2135 09/17/21 0109 09/17/21 0313 09/17/21 0807  GLUCAP 99 73 68* 98 77    Coagulation Studies: No results for input(s): LABPROT, INR in the last 72 hours.   Imaging   No results found.   Medications:     Current Medications:  aspirin  81 mg Per Tube Daily   atorvastatin  80 mg Per Tube Daily   B-complex with vitamin C  1 tablet Per Tube Daily   chlorhexidine gluconate (MEDLINE KIT)  15 mL Mouth Rinse BID   Chlorhexidine Gluconate Cloth  6 each Topical Q0600   clonazePAM  0.5 mg Per Tube BID   dextrose       docusate  100 mg Per Tube BID   insulin aspart  0-20 Units Subcutaneous TID WC   mouth rinse  15 mL Mouth Rinse 10 times per day   midodrine  10 mg Per Tube TID WC   ondansetron (ZOFRAN) IV  4 mg Intravenous Once   pantoprazole sodium  40 mg Per Tube Daily   polyethylene glycol  17 g Per Tube Daily   rOPINIRole  0.25 mg Per Tube QHS   sodium chloride flush  3 mL Intravenous Q12H   ticagrelor  90 mg Per Tube BID    Infusions:   prismasol BGK 4/2.5 400 mL/hr at 09/17/21 0400    prismasol BGK 4/2.5 300 mL/hr at 09/17/21 0400   sodium chloride Stopped (09/13/21 1550)   sodium chloride     sodium chloride 10 mL/hr at 09/17/21 0900   sodium chloride 10 mL/hr at 09/17/21 0900   amiodarone 30 mg/hr (09/17/21 0746)   DOPamine 5 mcg/kg/min (09/17/21 0900)   fentaNYL infusion INTRAVENOUS 150 mcg/hr (09/17/21 0900)   heparin 2,150 Units/hr (09/17/21 0920)   midazolam 4 mg/hr (09/17/21 0900)   norepinephrine (LEVOPHED) Adult  infusion 16 mcg/min (09/17/21 0900)   prismasol BGK 4/2.5 2,000 mL/hr at 09/17/21 9030    Assessment/Plan   Cardiogenic shock -In setting of CHB  -Echo this admit with EF 35-40% -Currently on Dopa 5 and NE 16. Wean as able.  -Chronic hypotension on midodrine 10 mg TID. SBP typically 80s during HD  2. NSTEMI with known hx CAD -NSTEMI 10/21 s/p DES to LAD -S/p DES distal to previously placed LAD stent 05/22 in setting of elevated troponin during admit for HD catheter exchange -Has known occlusion of RI and OM1 -HS troponin up to 4,493 this admit. CP on admission in setting of hypotension and CHB -Potentially type II MI in setting of shock -On asa, brilinta and statin. Consider stopping aspirin when add back warfarin  3. CHB -In setting of hyperkalemia, K 6.6 on presentation -Temporary pacer placed on admit, removed 12/05. Temp pacer replaced d/t recurrent CHB. -EP following. High risk of infection with PPM.  -If continues to progress, and able to wean off pressors may consider Micra Leadless pacemaker. Dr. Haroldine Laws discussed with EP  4. VT -Sustained VT this am s/p synchronized cardioversion -Keep K >4 and Mag >2 -Now on amio gtt  5. Atrial fibrillation/flutter -No AF on tele -On amio gtt for sustained VT -  Currently on heparin gtt, coumadin PTA  6. ESRD/hyperkalemia -K 6.6 on admit, corrected -Currently on CRRT. Has clotted L AV fistula and temp R HD cath -Per nephrology  7. Acute on chronic respiratory failure with hypoxia and hypercapnia -With underlying untreated OSA and OHS -Intubated prior to insertion of new temp pacing wire 12/05 -Vent management per CCM -Follows commands   Length of Stay: Quay, LINDSAY N, PA-C  09/17/2021, 9:37 AM  Advanced Heart Failure Team Pager [phone] (M-F; 7a - 5p)  Please contact [facility] for night-coverage after hours (4p -7a ) and weekends on [email or web address]   Agree with above.   Complicated case. 55 y/o male with CAD  s/p previous anterior MI due to in-stent thrombosis, EF 40-45%, ESRD with failed transplant essentially exhausted venous access, AF, DM2, OSA.  Has been struggling with outpatient HD lately and poor QOL. Has clotted L AVF and RIJ HD cath. So now dialyzing through right groin access this admit.   Admitted with CHB in setting of K 6.6. Required TVP which was then removed. Had recurrent CHB in setting of amio for AF when K 5.5. TVP replaced (via left groin)    This am had VT arrest.   Remains intubated/sedated  General:  On vent will arouse to noxious stimuli HEENT: normal + ETT Neck: supple. JVP 8-9 Carotids 2+ bilat; no bruits. No lymphadenopathy or thryomegaly appreciated. Cor: PMI nondisplaced. Irregular rate & rhythm. No rubs, gallops or murmurs. Lungs: clear Abdomen: obese soft, nontender, nondistended. No hepatosplenomegaly. No bruits or masses. Good bowel sounds. Extremities: no cyanosis, clubbing, rash, tr edema + calciphylaxis scare Neuro: intubated/sedated  Very difficult situation. I have d/w his primary Nephrologist, CCM and EP.   He has clearly demonstrated the need for permanent pacing but venous access and infection risk are major barriers in setting of his ESRD. He also apparently has had poor QOL but has apparently remained upbeat despite his struggles.   Even if he recovers this admit, I suspect overall mortality < 6-12 months. He has been seen by Palliative Care but he has no family in [place] and mother is in [place] and does not speak [place].   I think it is reasonable to continue aggressive care to try and stabilize him. If we can get him off the vent and wean pressors and he has stable HD access, I would be in favor of implanting leadless RV pacer (Micra) with the understanding that he would still be at substantial risk of device infection and that we would not attempt device extraction if there was a question of device seeding.   If we are unable to stabilize would have  low threshold to move to comfort care.   CRITICAL CARE Performed by: Glori Bickers  Total critical care time: 60 minutes  Critical care time was exclusive of separately billable procedures and treating other patients.  Critical care was necessary to treat or prevent imminent or life-threatening deterioration.  Critical care was time spent personally by me (independent of midlevel providers or residents) on the following activities: development of treatment plan with patient and/or surrogate as well as nursing, discussions with consultants, evaluation of patient's response to treatment, examination of patient, obtaining history from patient or surrogate, ordering and performing treatments and interventions, ordering and review of laboratory studies, ordering and review of radiographic studies, pulse oximetry and re-evaluation of patient's condition.  Glori Bickers, MD  4:10 PM

## 2021-09-17 NOTE — Progress Notes (Signed)
Inpatient Diabetes Program Recommendations  AACE/ADA: New Consensus Statement on Inpatient Glycemic Control (2015)  Target Ranges:  Prepandial:   less than 140 mg/dL      Peak postprandial:   less than 180 mg/dL (1-2 hours)      Critically ill patients:  140 - 180 mg/dL   Lab Results  Component Value Date   GLUCAP 77 09/17/2021    Review of Glycemic Control  Latest Reference Range & Units 09/16/21 19:41 09/16/21 21:35 09/17/21 01:09 09/17/21 03:13 09/17/21 08:07  Glucose-Capillary 70 - 99 mg/dL 99 73 68 (L) 98 77  Diabetes history: DM 2 Outpatient Diabetes medications:   Toujeo 80 units bid U500 insulin 60 units with breakfast/70 units with with lunch/50 units with supper Current orders for Inpatient glycemic control:  Novolog resistant tid with meals  Inpatient Diabetes Program Recommendations:    Agree with d/c of meal coverage and Semglee for now.  If tube feeds restarted, may need resumption of part of basal and tube feed coverage.  Recommend change of Novolog to sensitive q 4 hours.   Thanks,  Adah Perl, RN, BC-ADM Inpatient Diabetes Coordinator Pager [phone]  (8a-5p)

## 2021-09-17 NOTE — Progress Notes (Addendum)
Progress Note  Patient Name: [name] [name] Date of Encounter: 09/17/2021  [facility] Cardiologist: None Dr. Julianne Handler  Subjective   Intubated and sedated  Inpatient Medications    Scheduled Meds:  aspirin  81 mg Per Tube Daily   atorvastatin  80 mg Per Tube Daily   B-complex with vitamin C  1 tablet Per Tube Daily   chlorhexidine gluconate (MEDLINE KIT)  15 mL Mouth Rinse BID   Chlorhexidine Gluconate Cloth  6 each Topical Q0600   clonazePAM  0.5 mg Per Tube BID   dextrose       docusate  100 mg Per Tube BID   insulin aspart  0-9 Units Subcutaneous Q4H   mouth rinse  15 mL Mouth Rinse 10 times per day   midodrine  10 mg Per Tube TID WC   ondansetron (ZOFRAN) IV  4 mg Intravenous Once   pantoprazole sodium  40 mg Per Tube Daily   polyethylene glycol  17 g Per Tube Daily   rOPINIRole  0.25 mg Per Tube QHS   sodium chloride flush  3 mL Intravenous Q12H   ticagrelor  90 mg Per Tube BID   Continuous Infusions:   prismasol BGK 4/2.5 400 mL/hr at 09/17/21 0400    prismasol BGK 4/2.5 300 mL/hr at 09/17/21 0400   sodium chloride Stopped (09/13/21 1550)   sodium chloride     sodium chloride 10 mL/hr at 09/17/21 1100   sodium chloride 10 mL/hr at 09/17/21 1100   amiodarone 30 mg/hr (09/17/21 0746)   DOPamine 5 mcg/kg/min (09/17/21 1100)   fentaNYL infusion INTRAVENOUS 150 mcg/hr (09/17/21 1100)   heparin 2,150 Units/hr (09/17/21 1100)   midazolam 4 mg/hr (09/17/21 1100)   norepinephrine (LEVOPHED) Adult infusion 16 mcg/min (09/17/21 1100)   prismasol BGK 4/2.5 2,000 mL/hr at 09/17/21 0941   PRN Meds: sodium chloride, sodium chloride, sodium chloride, sodium chloride, acetaminophen, alteplase, docusate, fentaNYL (SUBLIMAZE) injection, fentaNYL (SUBLIMAZE) injection, heparin, lidocaine (PF), lidocaine-prilocaine, meclizine, ondansetron (ZOFRAN) IV, pentafluoroprop-tetrafluoroeth, polyethylene glycol, sodium chloride flush, sodium chloride flush   Vital Signs    Vitals:    09/17/21 1030 09/17/21 1045 09/17/21 1100 09/17/21 1144  BP:   117/61 (!) 137/52  Pulse: 91 92 94 92  Resp:    (!) 26  Temp:      TempSrc:      SpO2: 100% 98% 98%   Weight:      Height:        Intake/Output Summary (Last 24 hours) at 09/17/2021 1154 Last data filed at 09/17/2021 1100 Gross per 24 hour  Intake 2705.58 ml  Output 2668 ml  Net 37.58 ml   Last 3 Weights 09/17/2021 09/16/2021 09/14/2021  Weight (lbs) 288 lb 12.8 oz 290 lb 5.5 oz 279 lb 15.8 oz  Weight (kg) 131 kg 131.7 kg 127 kg      Telemetry    VPaced rhythm, had prolonged episode of WCT last night (fairly slow 110's) did require shock > SR he is conduction SR this AM with 1st degree AVblock, checked by Dr. Curt Bears - Personally Reviewed  ECG    WCT  c/w VT 115bpm,  - Personally Reviewed  Physical Exam   GEN: sedated   Neck: No JVD Cardiac: RRR, no murmurs, rubs, or gallops.  Respiratory: CTA b/l (ant/lat auscultation only), intubated GI: Soft, nontender, obese MS: No edema; No deformity. Neuro:  unable to assess Psych: unable to assess   R groin is soft, stable,  no hematoma  Labs  High Sensitivity Troponin:   Recent Labs  Lab 09/25/2021 1425 09/27/2021 2050  TROPONINIHS 4,493* 5,596*     Chemistry Recent Labs  Lab 09/29/2021 1425 09/28/2021 1430 09/13/21 0632 09/14/21 0500 09/16/21 1614 09/17/21 0038 09/17/21 0106 09/17/21 0317  NA 132*   < > 134*   < > 133* 132*  132* 135 131*  K 6.0*   < > 5.5*   < > 4.6 4.1  4.1 4.0 4.0  CL 92*   < > 95*   < > 98 99  98  --  97*  CO2 25   < > 28   < > 22 24  24   --  24  GLUCOSE 425*   < > 305*   < > 145* 57*  57*  --  108*  BUN 35*   < > 43*   < > 40* 31*  31*  --  29*  CREATININE 8.10*   < > 8.69*   < > 7.10* 5.55*  5.46*  --  5.16*  CALCIUM 9.6   < > 8.6*   < > 8.4* 8.3*  8.3*  --  8.1*  MG  --    < > 2.6*  --   --  2.3  --  2.3  PROT 7.6  --   --   --   --  6.8  --   --   ALBUMIN 3.4*  --   --    < > 2.6* 2.7*  2.7*  --  2.6*  AST 37  --    --   --   --  49*  --   --   ALT 15  --   --   --   --  35  --   --   ALKPHOS 62  --   --   --   --  57  --   --   BILITOT 1.6*  --   --   --   --  1.8*  --   --   GFRNONAA 7*   < > 7*   < > 8* 11*  11*  --  12*  ANIONGAP 15   < > 11   < > 13 9  10   --  10   < > = values in this interval not displayed.    Lipids No results for input(s): CHOL, TRIG, HDL, LABVLDL, LDLCALC, CHOLHDL in the last 168 hours.  Hematology Recent Labs  Lab 10/07/2021 2230 10/10/2021 2241 09/16/21 0431 09/16/21 1109 09/16/21 1411 09/17/21 0106 09/17/21 0317  WBC 9.2  --  14.2*  --   --   --  9.5  RBC 3.31*  --  3.53*  --   --   --  3.45*  HGB 10.2*   < > 10.6*   < > 11.2* 11.9* 10.3*  HCT 33.1*   < > 35.4*   < > 33.0* 35.0* 33.2*  MCV 100.0  --  100.3*  --   --   --  96.2  MCH 30.8  --  30.0  --   --   --  29.9  MCHC 30.8  --  29.9*  --   --   --  31.0  RDW 22.4*  --  22.4*  --   --   --  23.2*  PLT 280  --  380  --   --   --  312   < > = values in this interval not  displayed.   Thyroid No results for input(s): TSH, FREET4 in the last 168 hours.  BNP Recent Labs  Lab 10/04/2021 2020  BNP 3,842.7*    DDimer No results for input(s): DDIMER in the last 168 hours.   Radiology      Cardiac Studies    09/14/21: TTE IMPRESSIONS   1. Technically difficult study despite use of definity contrast due to  off axis views and poor visualization of all endocardial segments. Left  ventricular ejection fraction, by estimation, is 50 to 55%. The left  ventricle has low normal function. Left  ventricular endocardial border not optimally defined to evaluate regional  wall motion. The left ventricular internal cavity size was mildly dilated.  There is mild asymmetric left ventricular hypertrophy of the basal-septal  segment. Left ventricular  diastolic parameters are consistent with Grade III diastolic dysfunction  (restrictive).   2. Right ventricular systolic function is normal. The right ventricular  size  is normal.   3. Left atrial size was moderately dilated.   4. Right atrial size was mild to moderately dilated.   5. The mitral valve is degenerative. Mild mitral valve regurgitation.  Moderate to severe mitral annular calcification.   6. The aortic valve is tricuspid. There is moderate calcification of the  aortic valve. There is moderate thickening of the aortic valve. Aortic  valve regurgitation is mild. Mild aortic valve stenosis.   7. The inferior vena cava is dilated in size with <50% respiratory  variability, suggesting right atrial pressure of 15 mmHg.   8. A temporary pacing wire is visualized in the IVC and RV.   Patient Profile     55 y.o. male  with a history of ESRD on HD, COPD, AI, RBBB, OSA, DMII, persistent atrial flutter, chronic OAC on coumadin, chronic DVT, chronic diastolic CHF, h/o endocarditis in 2016, CAD (occlusion of the LAD beyond the stented segment. DES placed)  Hx of August 2022 for creation of left arm dialysis fistula. He had a PEA arrest following the procedure felt to be due to hypercarbia. He developed atrial fib with RVR during the hospitalization and had a TEE guided cardioversion  Pt previously seen by EP team 07/2020 for AFL with slow ventricular rate and intermittent AV block.  His rates improved to 50-60s on dopamine and he ultimately was weaned off and remained stable. He has bilateral fistulas and history of bacteremia and was felt to overall be a poor PPM candidate, though rates improved during that visit as his acute illness did, with no urgent indication for pacing.   Admitted this time with several days of R sided chest discomfort, nausea and vomiting.  He had 2 syncopal episodes. Called EMS and found to have HR in the 30-40s with intermittent AV block of unclear degree (at least 2nd) per report. long periods of ventricular standstill noted on admission with syncope.  Temp pacing wire placed, suspect again 2/2 acute illness/electrolyte impbalance,  very poor PPM candidate  10/06/2021, post HD his conduction improved and temp wire was pulled  09/16/2021 evening recurrent CHB with pauses  brought back emergently to the cath lab for temp wire again/intubated at that time  10/17/20 early AM developed WCT 110's perhaps with an uptitration in his levophed?, with associated worsening in BP ultimately shocked to [place]    1.  Complete heart block Temp wire in place His sinus rates are faster (80's-90s) and conducting this AM   Leave temp wire Should he improve  significantly clinically, is able to come off pressors and extubate Rhenda [place] plan for leadless pacer implant    2. NSTEMI Likely due to acute medical illness No plans for cath at this time On Brilinta, statin no BB with bradycardia/hypotension   3. Prior DVT On heparin per primary team Warfarin outpt   4. H/o afib maintaining sinus rhythm On amiodarone PO >> gtt last night with VT   5. Chronic systolic dysfunction Volume managed with HD/CRRT AHF team is on board this AM   6. ESRD/ hyperkalemia CRRT started yesterday   7. Hypotension On midodrine out pt and here 8. shock On levophed and dopamine, he has required pressor support despite adequate HRs paced or conducted  Continue with nephrology/CCM  9. Respiratory failure multifactorial 10. Baseline OSA and OHS Remains intubated In review of CCM notes, poor outpt compliance with CPAP C/w CCM   Dr. Curt Bears has seen and examined the patient this AM Ayushi Pla need to continue [place]  discussions, looks like CCM/Palliative have started to reach out to friends/family (out of the [place])    For questions or updates, please contact [place] Please consult [email or web address] for contact info under        Signed, Baldwin [place], PA-C  09/17/2021, 11:54 AM    I have seen and examined this patient with Tommye Standard.  Agree with above, note added to reflect my findings.  Patient remains intubated and sedated on  CRRT and multiple vasoactive medications.  Had ventricular tachycardia overnight last night.  Blood pressure was stable, though the patient was cardioverted out of ventricular tachycardia and started on IV amiodarone.    GEN: Well nourished, well developed, in no acute distress  HEENT: normal  Neck: no JVD, carotid bruits, or masses Cardiac: RRR; no murmurs, rubs, or gallops,no edema  Respiratory:  clear to auscultation bilaterally, normal work of breathing GI: soft, nontender, nondistended, + BS MS: no deformity or atrophy  Skin: warm and dry Neuro:  Strength and sensation are intact Psych: euthymic mood, full affect  1.  Complete heart block: Temporary wire in place.  Patient's AV conduction has improved after dialysis and CRRT.  Would continue with temporary wire for now as the patient is critically ill.  He Aleesa Sweigert need further discussions on pacemaker implant.  Goals of care discussion encouraged.  2.  Non-STEMI: Likely due to acute medical illness.  No plans for catheterization at this time.    Kathelene Rumberger M. Dinisha Cai MD 09/17/2021 12:47 PM

## 2021-09-17 NOTE — Progress Notes (Signed)
Patient went in to sustained Vtach with drop in blood pressures and escalation of levophed requirement to 30. Patient delivered 200 J synchronized Cversion with return of NSR  ABG, CMP and Mg ordered to ensure electrolytes WNL IV Amio load and infusion started.

## 2021-09-17 NOTE — Progress Notes (Signed)
Brief Palliative Medicine Progress Note:  GOC completed with patient's brother via facetime on patient's friend/Ashqer's phone. Full note to follow:  Recommendations/Plan: Continue full code/full scope Goal is to keep patient alive until brother can arrive from [place] in hopefully 2 days pending flights Brother is hopeful to keep the patient as stable as possible without doing "risky things" (such as extubate) that might possibly put him at higher risk for passing. If any changes to current care are recommended (extubation, stopping CRRT, etc) he would like to be notified Brother requests patient's friend/Ashqer be primary contact for now while he travels. Ashqer will assist medical team in staying in contact with brother for any needs Brother's international phone number is: [phone]; however, [facility] operator was not able to transfer my call per attempt yesterday PMT will shadow for needs and follow up for continued [place] once brother arrives to [facility]. If there are any imminent needs please call the service directly   Thank you for allowing PMT to assist in the care of this patient.  Elanda Garmany M. Tamala Julian [facility] Palliative Medicine Team Team Phone: [phone] NO CHARGE

## 2021-09-17 NOTE — Progress Notes (Signed)
[facility] Progress Note  Subjective:   Moved to 4K all CRRT fluids VTach req defib overnight P 3.6 this AM Net even UF CRRT filter running well Remains on NE and dopamine  Vitals:   09/17/21 0400 09/17/21 0500 09/17/21 0600 09/17/21 0700  BP: 134/62 (!) 108/53 (!) 111/54 (!) 111/57  Pulse: 94 89 86 87  Resp:      Temp: 98.9 F (37.2 C)     TempSrc: Oral     SpO2: 96% 99% 98% 98%  Weight: 131 kg     Height:        Exam:   Intubted, sedated  Coarse BS b/l  regular  Abd soft ntnd no ascites   Ext no LE edema   Alert, NF, ox3    LUA AVF no bruit, temp HD cath R groin   OP HD: MW TTS   130kg  400/500  2/2 bath  LUE AVF    - calcitriol 2.25 ug tiw  - iron 50mg weekly  - auryxia/ renvela binders  - lokelma 8.4 on non HD days     CXR 12/03 - vasc congestion, IS edema mild   CT 12/03 angio chest - diffuse GG changes, small lung volumes   Assessment/ Plan: Unstable bradycardia --> Asystole 09/30/2021 with replacement of temp venous pacemaker; per EP; also s/p VTach 09/17/21 req DCCV Chronic hypotension, in shock now on 2 pressors ESRD: as above, THS.  Clotted LUE AVG, has R Fem Temp HD cath at this time.  Req CRRT for high K. Stable on 4K fluids Clotted AVG - in LUE. IR following declot and/or TDC, not stable at this time for procedures Acute on chronic resp failure - AHRF s/p ETT 12/6 during asytolic arrest, per CCM NSTEMI - ^trop, per cardiology Volume - Holding UF at this time given hypotension, reassess Chronic syst/ diast CHF CAD  Atrial fib - per EP Acute encephalopathy - improved w/ dialysis Anemia ckd - cont Fe weekly , no esa needed (Hb > 10)  MBD ckd - trend Ca and PO on CRRT Chronic hyperkalemia: outpt [place] hold while on CRRT  Cont CRRT current settings.  Palliative following for GOC.  Poor prognosis   Ryan B Sanford, MD  09/17/2021, 7:58 AM   Recent Labs  Lab 09/17/21 0038 09/17/21 0106 09/17/21 0317  K 4.1  4.1 4.0 4.0  BUN  31*  31*  --  29*  CREATININE 5.55*  5.46*  --  5.16*  CALCIUM 8.3*  8.3*  --  8.1*  PHOS 3.9  --  3.6  HGB  --  11.9* 10.3*    Inpatient medications:  aspirin  81 mg Per Tube Daily   atorvastatin  80 mg Per Tube Daily   B-complex with vitamin C  1 tablet Per Tube Daily   chlorhexidine gluconate (MEDLINE KIT)  15 mL Mouth Rinse BID   Chlorhexidine Gluconate Cloth  6 each Topical Q0600   clonazePAM  0.5 mg Per Tube BID   dextrose       docusate  100 mg Per Tube BID   insulin aspart  0-20 Units Subcutaneous TID WC   mouth rinse  15 mL Mouth Rinse 10 times per day   midodrine  10 mg Per Tube TID WC   ondansetron (ZOFRAN) IV  4 mg Intravenous Once   pantoprazole sodium  40 mg Per Tube Daily   polyethylene glycol  17 g Per Tube Daily   rOPINIRole  0.25 mg   Per Tube QHS   sodium chloride flush  3 mL Intravenous Q12H   ticagrelor  90 mg Per Tube BID     prismasol BGK 4/2.5 400 mL/hr at 09/17/21 0400    prismasol BGK 4/2.5 300 mL/hr at 09/17/21 0400   sodium chloride Stopped (09/13/21 1550)   sodium chloride     sodium chloride 10 mL/hr at 09/17/21 0700   sodium chloride 10 mL/hr at 09/17/21 0700   amiodarone 30 mg/hr (09/17/21 0746)   DOPamine 5 mcg/kg/min (09/17/21 0700)   fentaNYL infusion INTRAVENOUS 150 mcg/hr (09/17/21 0700)   heparin 2,150 Units/hr (09/17/21 0700)   midazolam 4 mg/hr (09/17/21 0700)   norepinephrine (LEVOPHED) Adult infusion 16 mcg/min (09/17/21 0700)   prismasol BGK 4/2.5 2,000 mL/hr at 09/17/21 0635   sodium chloride, sodium chloride, sodium chloride, sodium chloride, acetaminophen, alteplase, docusate, fentaNYL (SUBLIMAZE) injection, fentaNYL (SUBLIMAZE) injection, heparin, lidocaine (PF), lidocaine-prilocaine, meclizine, ondansetron (ZOFRAN) IV, pentafluoroprop-tetrafluoroeth, polyethylene glycol, sodium chloride flush, sodium chloride flush       

## 2021-09-18 DIAGNOSIS — J189 Pneumonia, unspecified organism: Secondary | ICD-10-CM

## 2021-09-18 DIAGNOSIS — R001 Bradycardia, unspecified: Secondary | ICD-10-CM | POA: Diagnosis not present

## 2021-09-18 DIAGNOSIS — I469 Cardiac arrest, cause unspecified: Secondary | ICD-10-CM

## 2021-09-18 DIAGNOSIS — Z9911 Dependence on respirator [ventilator] status: Secondary | ICD-10-CM | POA: Diagnosis not present

## 2021-09-18 DIAGNOSIS — R570 Cardiogenic shock: Secondary | ICD-10-CM | POA: Diagnosis not present

## 2021-09-18 DIAGNOSIS — I442 Atrioventricular block, complete: Secondary | ICD-10-CM | POA: Diagnosis not present

## 2021-09-18 DIAGNOSIS — I214 Non-ST elevation (NSTEMI) myocardial infarction: Secondary | ICD-10-CM | POA: Diagnosis not present

## 2021-09-18 LAB — COOXEMETRY PANEL
Carboxyhemoglobin: 1.8 % — ABNORMAL HIGH (ref 0.5–1.5)
Carboxyhemoglobin: 1.2 % (ref 0.5–1.5)
Methemoglobin: 0.9 % (ref 0.0–1.5)
Methemoglobin: 0.9 % (ref 0.0–1.5)
O2 Saturation: 92.1 %
O2 Saturation: 47.1 %
Total hemoglobin: 10.6 g/dL — ABNORMAL LOW (ref 12.0–16.0)
Total hemoglobin: 10.8 g/dL — ABNORMAL LOW (ref 12.0–16.0)

## 2021-09-18 LAB — RENAL FUNCTION PANEL
Albumin: 2.4 g/dL — ABNORMAL LOW (ref 3.5–5.0)
Albumin: 2.3 g/dL — ABNORMAL LOW (ref 3.5–5.0)
Anion gap: 10 (ref 5–15)
Anion gap: 12 (ref 5–15)
BUN: 16 mg/dL (ref 6–20)
BUN: 15 mg/dL (ref 6–20)
CO2: 23 mmol/L (ref 22–32)
CO2: 21 mmol/L — ABNORMAL LOW (ref 22–32)
Calcium: 8.2 mg/dL — ABNORMAL LOW (ref 8.9–10.3)
Calcium: 8.2 mg/dL — ABNORMAL LOW (ref 8.9–10.3)
Chloride: 101 mmol/L (ref 98–111)
Chloride: 102 mmol/L (ref 98–111)
Creatinine, Ser: 2.99 mg/dL — ABNORMAL HIGH (ref 0.61–1.24)
Creatinine, Ser: 2.71 mg/dL — ABNORMAL HIGH (ref 0.61–1.24)
GFR, Estimated: 24 mL/min — ABNORMAL LOW (ref >60)
GFR, Estimated: 27 mL/min — ABNORMAL LOW (ref >60)
Glucose, Bld: 123 mg/dL — ABNORMAL HIGH (ref 70–99)
Glucose, Bld: 199 mg/dL — ABNORMAL HIGH (ref 70–99)
Phosphorus: 2.8 mg/dL (ref 2.5–4.6)
Phosphorus: 3.5 mg/dL (ref 2.5–4.6)
Potassium: 4.4 mmol/L (ref 3.5–5.1)
Potassium: 4.8 mmol/L (ref 3.5–5.1)
Sodium: 134 mmol/L — ABNORMAL LOW (ref 135–145)
Sodium: 135 mmol/L (ref 135–145)

## 2021-09-18 LAB — CBC
HCT: 34.8 % — ABNORMAL LOW (ref 39.0–52.0)
Hemoglobin: 10.6 g/dL — ABNORMAL LOW (ref 13.0–17.0)
MCH: 29.7 pg (ref 26.0–34.0)
MCHC: 30.5 g/dL (ref 30.0–36.0)
MCV: 97.5 fL (ref 80.0–100.0)
Platelets: 306 10*3/uL (ref 150–400)
RBC: 3.57 MIL/uL — ABNORMAL LOW (ref 4.22–5.81)
RDW: 23.1 % — ABNORMAL HIGH (ref 11.5–15.5)
WBC: 9 10*3/uL (ref 4.0–10.5)
nRBC: 0 % (ref 0.0–0.2)

## 2021-09-18 LAB — GLUCOSE, CAPILLARY
Glucose-Capillary: 102 mg/dL — ABNORMAL HIGH (ref 70–99)
Glucose-Capillary: 123 mg/dL — ABNORMAL HIGH (ref 70–99)
Glucose-Capillary: 125 mg/dL — ABNORMAL HIGH (ref 70–99)
Glucose-Capillary: 178 mg/dL — ABNORMAL HIGH (ref 70–99)
Glucose-Capillary: 179 mg/dL — ABNORMAL HIGH (ref 70–99)
Glucose-Capillary: 203 mg/dL — ABNORMAL HIGH (ref 70–99)
Glucose-Capillary: 93 mg/dL (ref 70–99)

## 2021-09-18 LAB — PHOSPHORUS: Phosphorus: 3 mg/dL (ref 2.5–4.6)

## 2021-09-18 LAB — HEPARIN LEVEL (UNFRACTIONATED): Heparin Unfractionated: 0.42 IU/mL (ref 0.30–0.70)

## 2021-09-18 LAB — PROCALCITONIN: Procalcitonin: 8.37 ng/mL

## 2021-09-18 LAB — LIPID PANEL
Cholesterol: 82 mg/dL (ref 0–200)
HDL: 35 mg/dL — ABNORMAL LOW (ref >40)
LDL Cholesterol: 35 mg/dL (ref 0–99)
Total CHOL/HDL Ratio: 2.3 RATIO
Triglycerides: 60 mg/dL (ref <150)
VLDL: 12 mg/dL (ref 0–40)

## 2021-09-18 LAB — MAGNESIUM
Magnesium: 2.3 mg/dL (ref 1.7–2.4)
Magnesium: 2.5 mg/dL — ABNORMAL HIGH (ref 1.7–2.4)
Magnesium: 2.4 mg/dL (ref 1.7–2.4)

## 2021-09-18 MED ORDER — INSULIN ASPART 100 UNIT/ML IJ SOLN
2.0000 [IU] | INTRAMUSCULAR | Status: DC
  Administered 2021-09-18 – 2021-09-19 (×4): 2 [IU] via SUBCUTANEOUS

## 2021-09-18 MED ORDER — DEXMEDETOMIDINE HCL IN NACL 400 MCG/100ML IV SOLN
0.0000 ug/kg/h | INTRAVENOUS | Status: AC
  Administered 2021-09-18 (×2): 0.4 ug/kg/h via INTRAVENOUS
  Administered 2021-09-18: 0.7 ug/kg/h via INTRAVENOUS
  Administered 2021-09-19 (×3): 1.2 ug/kg/h via INTRAVENOUS
  Administered 2021-09-19: 0.7 ug/kg/h via INTRAVENOUS
  Administered 2021-09-19 (×3): 1.2 ug/kg/h via INTRAVENOUS
  Administered 2021-09-19: 0.5 ug/kg/h via INTRAVENOUS
  Administered 2021-09-20 (×2): 1 ug/kg/h via INTRAVENOUS
  Administered 2021-09-20 (×3): 1.2 ug/kg/h via INTRAVENOUS
  Administered 2021-09-20: 1 ug/kg/h via INTRAVENOUS
  Administered 2021-09-20: 1.2 ug/kg/h via INTRAVENOUS
  Administered 2021-09-20 – 2021-09-21 (×3): 1 ug/kg/h via INTRAVENOUS
  Administered 2021-09-21: 1.2 ug/kg/h via INTRAVENOUS
  Filled 2021-09-18 (×24): qty 100

## 2021-09-18 MED ORDER — VITAL AF 1.2 CAL PO LIQD
1000.0000 mL | ORAL | Status: DC
  Administered 2021-09-19: 1000 mL

## 2021-09-18 MED ORDER — POLYETHYLENE GLYCOL 3350 17 G PO PACK
17.0000 g | PACK | Freq: Every day | ORAL | Status: DC
  Administered 2021-09-19 – 2021-09-21 (×3): 17 g
  Filled 2021-09-18 (×4): qty 1

## 2021-09-18 MED ORDER — VITAL HIGH PROTEIN PO LIQD
1000.0000 mL | ORAL | Status: AC
  Administered 2021-09-18: 1000 mL

## 2021-09-18 MED ORDER — MIDODRINE HCL 5 MG PO TABS
15.0000 mg | ORAL_TABLET | Freq: Three times a day (TID) | ORAL | Status: DC
  Administered 2021-09-18 – 2021-09-21 (×10): 15 mg
  Filled 2021-09-18 (×10): qty 3

## 2021-09-18 MED ORDER — PROSOURCE TF PO LIQD
90.0000 mL | Freq: Three times a day (TID) | ORAL | Status: DC
  Administered 2021-09-18 – 2021-09-20 (×8): 90 mL
  Filled 2021-09-18 (×9): qty 90

## 2021-09-18 MED ORDER — DOCUSATE SODIUM 50 MG/5ML PO LIQD
100.0000 mg | Freq: Two times a day (BID) | ORAL | Status: DC
  Administered 2021-09-18 – 2021-09-21 (×7): 100 mg
  Filled 2021-09-18 (×7): qty 10

## 2021-09-18 MED ORDER — PROSOURCE TF PO LIQD
45.0000 mL | Freq: Two times a day (BID) | ORAL | Status: DC
  Administered 2021-09-18: 45 mL
  Filled 2021-09-18: qty 45

## 2021-09-18 NOTE — Progress Notes (Addendum)
Progress Note  Patient Name: [name] [name] Date of Encounter: 09/18/2021  [facility] Cardiologist: None Dr. Julianne Handler  Subjective   Intubated and sedated but follows commands, no clear localizing deficits   Inpatient Medications    Scheduled Meds:  aspirin  81 mg Per Tube Daily   atorvastatin  80 mg Per Tube Daily   B-complex with vitamin C  1 tablet Per Tube Daily   chlorhexidine gluconate (MEDLINE KIT)  15 mL Mouth Rinse BID   Chlorhexidine Gluconate Cloth  6 each Topical Q0600   clonazePAM  0.5 mg Per Tube BID   docusate  100 mg Per Tube BID   feeding supplement (PROSource TF)  45 mL Per Tube BID   feeding supplement (VITAL HIGH PROTEIN)  1,000 mL Per Tube Q24H   insulin aspart  0-9 Units Subcutaneous Q4H   mouth rinse  15 mL Mouth Rinse 10 times per day   midodrine  15 mg Per Tube TID WC   ondansetron (ZOFRAN) IV  4 mg Intravenous Once   pantoprazole sodium  40 mg Per Tube Daily   polyethylene glycol  17 g Per Tube Daily   rOPINIRole  0.25 mg Per Tube QHS   sodium chloride flush  3 mL Intravenous Q12H   ticagrelor  90 mg Per Tube BID   Continuous Infusions:   prismasol BGK 4/2.5 400 mL/hr at 09/18/21 0555    prismasol BGK 4/2.5 300 mL/hr at 09/17/21 2107   sodium chloride Stopped (09/13/21 1550)   sodium chloride     sodium chloride 10 mL/hr at 09/18/21 0900   sodium chloride 10 mL/hr at 09/18/21 0900   amiodarone 30 mg/hr (09/18/21 0900)   ceFEPime (MAXIPIME) IV Stopped (09/18/21 0851)   dexmedetomidine (PRECEDEX) IV infusion 0.4 mcg/kg/hr (09/18/21 0916)   DOPamine 10 mcg/kg/min (09/18/21 8144)   fentaNYL infusion INTRAVENOUS 200 mcg/hr (09/18/21 0900)   heparin 2,150 Units/hr (09/18/21 0923)   norepinephrine (LEVOPHED) Adult infusion 20 mcg/min (09/18/21 0900)   prismasol BGK 4/2.5 2,000 mL/hr at 09/18/21 0818   PRN Meds: sodium chloride, sodium chloride, sodium chloride, sodium chloride, acetaminophen, alteplase, docusate, fentaNYL (SUBLIMAZE)  injection, fentaNYL (SUBLIMAZE) injection, heparin, lidocaine (PF), lidocaine-prilocaine, meclizine, ondansetron (ZOFRAN) IV, pentafluoroprop-tetrafluoroeth, polyethylene glycol, sodium chloride flush, sodium chloride flush   Vital Signs    Vitals:   09/18/21 0500 09/18/21 0600 09/18/21 0700 09/18/21 0730  BP: (!) 109/46 (!) 111/44 (!) 111/48   Pulse: 88 78 82 81  Resp:    (!) 26  Temp:      TempSrc:      SpO2: 100% 100% 100% 95%  Weight:      Height:        Intake/Output Summary (Last 24 hours) at 09/18/2021 0928 Last data filed at 09/18/2021 0900 Gross per 24 hour  Intake 3324.26 ml  Output 4865 ml  Net -1540.74 ml   Last 3 Weights 09/18/2021 09/17/2021 09/16/2021  Weight (lbs) 286 lb 2.5 oz 288 lb 12.8 oz 290 lb 5.5 oz  Weight (kg) 129.8 kg 131 kg 131.7 kg      Telemetry    He remains in SR, conducting with a 1st degree AVblock,  - Personally Reviewed  ECG    No new EKGs  - Personally Reviewed  Physical Exam   Exam is largely unchnaged GEN: sedated   Neck: No JVD Cardiac: RRR, no murmurs, rubs, or gallops.  Respiratory: CTA b/l (ant/lat auscultation only), intubated GI: Soft, nontender, obese MS: No edema; No deformity. Neuro:  unable to assess Psych: unable to assess   L groin/temp wire site is stable  Labs    High Sensitivity Troponin:   Recent Labs  Lab 10/03/2021 1425 09/24/2021 2050  TROPONINIHS 4,493* 5,596*     Chemistry Recent Labs  Lab 10/05/2021 1425 09/13/2021 1430 09/17/21 0038 09/17/21 0106 09/17/21 0317 09/17/21 1600 09/18/21 0156  NA 132*   < > 132*  132*   < > 131* 133* 134*  K 6.0*   < > 4.1  4.1   < > 4.0 4.6 4.4  CL 92*   < > 99  98  --  97* 98 101  CO2 25   < > 24  24  --  _0 GLUCOSE 425*   < > 57*  57*  --  108* 106* 123*  BUN 35*   < > 31*  31*  --  29* 20 16  CREATININE 8.10*   < > 5.55*  5.46*  --  5.16* 3.78* 2.99*  CALCIUM 9.6   < > 8.3*  8.3*  --  8.1* 8.3* 8.2*  MG  --    < > 2.3  --  2.3  --  2.3  PROT 7.6   --  6.8  --   --   --   --   ALBUMIN 3.4*   < > 2.7*  2.7*  --  2.6* 2.4* 2.4*  AST 37  --  49*  --   --   --   --   ALT 15  --  35  --   --   --   --   ALKPHOS 62  --  57  --   --   --   --   BILITOT 1.6*  --  1.8*  --   --   --   --   GFRNONAA 7*   < > 11*  11*  --  12* 18* 24*  ANIONGAP 15   < > 9  10  --  _1 < > = values in this interval not displayed.    Lipids  Recent Labs  Lab 09/18/21 0156  CHOL 82  TRIG 60  HDL 35*  LDLCALC 35  CHOLHDL 2.3    Hematology Recent Labs  Lab 09/16/21 0431 09/16/21 1109 09/17/21 0106 09/17/21 0317 09/18/21 0156  WBC 14.2*  --   --  9.5 9.0  RBC 3.53*  --   --  3.45* 3.57*  HGB 10.6*   < > 11.9* 10.3* 10.6*  HCT 35.4*   < > 35.0* 33.2* 34.8*  MCV 100.3*  --   --  96.2 97.5  MCH 30.0  --   --  29.9 29.7  MCHC 29.9*  --   --  31.0 30.5  RDW 22.4*  --   --  23.2* 23.1*  PLT 380  --   --  312 306   < > = values in this interval not displayed.   Thyroid No results for input(s): TSH, FREET4 in the last 168 hours.  BNP Recent Labs  Lab 09/16/2021 2020  BNP 3,842.7*    DDimer No results for input(s): DDIMER in the last 168 hours.   Radiology      Cardiac Studies    09/14/21: TTE IMPRESSIONS   1. Technically difficult study despite use of definity contrast due to  off axis views and poor visualization of all endocardial segments. Left  ventricular ejection fraction, by  estimation, is 50 to 55%. The left  ventricle has low normal function. Left  ventricular endocardial border not optimally defined to evaluate regional  wall motion. The left ventricular internal cavity size was mildly dilated.  There is mild asymmetric left ventricular hypertrophy of the basal-septal  segment. Left ventricular  diastolic parameters are consistent with Grade III diastolic dysfunction  (restrictive).   2. Right ventricular systolic function is normal. The right ventricular  size is normal.   3. Left atrial size was moderately  dilated.   4. Right atrial size was mild to moderately dilated.   5. The mitral valve is degenerative. Mild mitral valve regurgitation.  Moderate to severe mitral annular calcification.   6. The aortic valve is tricuspid. There is moderate calcification of the  aortic valve. There is moderate thickening of the aortic valve. Aortic  valve regurgitation is mild. Mild aortic valve stenosis.   7. The inferior vena cava is dilated in size with <50% respiratory  variability, suggesting right atrial pressure of 15 mmHg.   8. A temporary pacing wire is visualized in the IVC and RV.   Patient Profile     55 y.o. male  with a history of ESRD on HD, COPD, AI, RBBB, OSA, DMII, persistent atrial flutter, chronic OAC on coumadin, chronic DVT, chronic diastolic CHF, h/o endocarditis in 2016, CAD (occlusion of the LAD beyond the stented segment. DES placed)  Hx of August 2022 for creation of left arm dialysis fistula. He had a PEA arrest following the procedure felt to be due to hypercarbia. He developed atrial fib with RVR during the hospitalization and had a TEE guided cardioversion  Pt previously seen by EP team 07/2020 for AFL with slow ventricular rate and intermittent AV block.  His rates improved to 50-60s on dopamine and he ultimately was weaned off and remained stable. He has bilateral fistulas and history of bacteremia and was felt to overall be a poor PPM candidate, though rates improved during that visit as his acute illness did, with no urgent indication for pacing.   Admitted this time with several days of R sided chest discomfort, nausea and vomiting.  He had 2 syncopal episodes. Called EMS and found to have HR in the 30-40s with intermittent AV block of unclear degree (at least 2nd) per report. long periods of ventricular standstill noted on admission with syncope.  Temp pacing wire placed, suspect again 2/2 acute illness/electrolyte impbalance, very poor PPM candidate  09/18/2021, post HD his  conduction improved and temp wire was pulled  09/18/2021 evening recurrent CHB with pauses  brought back emergently to the cath lab for temp wire again/intubated at that time  10/17/20 early AM developed WCT 110's perhaps with an uptitration in his levophed?, with associated worsening in BP ultimately shocked to [place]    1.  Complete heart block Temp wire in place His sinus rates are faster (80's-90s) and conducting 1:1 Temp wire set at 60   Leave temp wire Should he improve significantly clinically, is able to come off pressors and extubate will plan for leadless pacer implant    2. NSTEMI Likely due to acute medical illness No plans for cath at this time On Brilinta, statin no BB with bradycardia/hypotension   3. Prior DVT On heparin per primary team Warfarin outpt   4. H/o afib maintaining sinus rhythm On amiodarone PO >> gtt last night with VT   5. Chronic systolic dysfunction Volume managed with HD/CRRT AHF  team is on board this AM   6. ESRD/ hyperkalemia CRRT started yesterday   7. Hypotension On midodrine out pt and here 8. shock Remains on levophed and dopamine, he has required pressor support despite adequate HRs paced or conducted  Continue with nephrology/CCM  9. Respiratory failure multifactorial 10. Baseline OSA and OHS Remains intubated In review of CCM notes, poor outpt compliance with CPAP C/w CCM   No new recommendations from EP perspective, we will follow along At this time he remains full code, palliative note reviewed Brother is traveling from [place], hopes to arrive in a couple days    For questions or updates, please contact [place] Please consult [email or web address] for contact info under        Signed, Baldwin [place], PA-C  09/18/2021, 9:28 AM   I have seen, examined the patient, and reviewed the above assessment and plan.  Changes to above are made where necessary.  On exam, ill appearing, intubated,  RRR.   Remains quite ill.  1:1 AV conduction.  Goals of care conversations are pending brother's arrival from [place].   Co Sign: Thompson Grayer, MD

## 2021-09-18 NOTE — Progress Notes (Signed)
ANTICOAGULATION CONSULT NOTE   Pharmacy Consult:  Heparin Indication: atrial fibrillation  Allergies  Allergen Reactions   Ace Inhibitors Cough   Pork-Derived Products Other (See Comments)    Entry determined to be clinically insignificant: OK TO GIVE HEPARIN (has tolerated well in the past)  Patient had allergic episode in 1989 after he ate pork at a fair. He developed fever and rash and went to the ED, but he did not have breathing issues. Received heparin in the past. Patient ok with removing pork allergy and listing as intolerance instead.    Shellfish-Derived Products Rash    Per ED note 01/2021    Patient Measurements: Height: 5\' 7"  (170.2 cm) Weight: 129.8 kg (286 lb 2.5 oz) IBW/kg (Calculated) : 66.1 Heparin Dosing Weight: 104 kg  Vital Signs: Temp: 98.4 F (36.9 C) (12/08 0800) Temp Source: Axillary (12/08 0800) BP: 112/54 (12/08 0800) Pulse Rate: 98 (12/08 0800)  Labs: Recent Labs    09/16/2021 2230 09/21/2021 2241 09/16/21 0431 09/16/21 0818 09/16/21 1109 09/17/21 0106 09/17/21 0317 09/17/21 1600 09/18/21 0156  HGB 10.2*   < > 10.6*  --    < > 11.9* 10.3*  --  10.6*  HCT 33.1*   < > 35.4*  --    < > 35.0* 33.2*  --  34.8*  PLT 280  --  380  --   --   --  312  --  306  APTT >200*  --   --   --   --   --   --   --   --   HEPARINUNFRC  --    < >  --  0.59  --   --  0.43  --  0.42  CREATININE 9.44*  --  10.28*  --    < >  --  5.16* 3.78* 2.99*   < > = values in this interval not displayed.     Estimated Creatinine Clearance: 35.3 mL/min (A) (by C-G formula based on SCr of 2.99 mg/dL (H)).   Assessment: 1 YOM presented with nausea and vomiting for 2 days and syncope.  Patient was hypotensive and bradycardic, requiring transcutaneous pacing.  S/p cath with temporary transvenous pacemaker insertion.  Pharmacy consulted to start IV heparin while Coumadin is on hold.  INR is sub-therapeutic on admit. PE noted on chest CT.  Heparin level therapeutic at 0.42, CBC  stable.  Goal of Therapy:  Heparin level 0.3-0.7 units/ml Monitor platelets by anticoagulation protocol: Yes   Plan:  Continue IV heparin at 2150 units/hr Daily heparin level and CBC  Arrie Senate, PharmD, [place], [facility] Clinical Pharmacist [phone] Please check AMION for all [place] numbers 09/18/2021

## 2021-09-18 NOTE — Progress Notes (Signed)
[facility] Progress Note  Subjective:   CRRT running well 75-120m/h net neg All 4K, K 4.4, P 3.0 Remains on NE and dopamine Palliative notes reviewed  Vitals:   09/18/21 0600 09/18/21 0700 09/18/21 0730 09/18/21 0800  BP: (!) 111/44 (!) 111/48  (!) 112/54  Pulse: 78 82 81 98  Resp:   (!) 26   Temp:    98.4 F (36.9 C)  TempSrc:    Axillary  SpO2: 100% 100% 95% 94%  Weight:      Height:        Exam:   Intubted, sedated  Coarse BS b/l  regular  Abd soft ntnd no ascites   Ext no LE edema   Alert, NF, ox3    LUA AVF no bruit, temp HD cath R groin   OP HD: MW TTS   130kg  400/500  2/2 bath  LUE AVF    - calcitriol 2.25 ug tiw  - iron 525mweekly  - auryxia/ renvela binders  - lokelma 8.4 on non HD days     CXR 12/03 - vasc congestion, IS edema mild   CT 12/03 angio chest - diffuse GG changes, small lung volumes   Assessment/ Plan: Unstable bradycardia --> Asystole 10/03/2021 with replacement of temp venous pacemaker; per EP; also s/p VTach 09/17/21 req DCCV; per EP / cardiology Chronic hypotension, in shock now on 2 pressors ESRD: as above, THS.  Clotted LUE AVG, has R Fem Temp HD cath at this time.  Req CRRT for high K. Stable on 4K fluids,  Cont Clotted AVG - in LUE. IR following declot and/or TDC, not stable at this time for procedures Acute on chronic resp failure - AHRF s/p ETT 1239/0uring asytolic arrest, per CCM NSTEMI - ^tCleora Fleetper cardiology Volume - Gentle UF on CRRT Chronic syst/ diast CHF CAD  Atrial fib - per EP Acute encephalopathy - improved w/ dialysis Anemia ckd - cont Fe weekly , no esa needed (Hb > 10)  MBD ckd - trend Ca and PO on CRRT, P ok Chronic hyperkalemia: outpt [place] hold while on CRRT  Cont CRRT current settings.  Palliative following for GOC.  Poor prognosis   RyRexene AgentMD  09/18/2021, 10:45 AM   Recent Labs  Lab 09/17/21 0317 09/17/21 1600 09/18/21 0156 09/18/21 0859  K 4.0 4.6 4.4  --   BUN 29* 20  16  --   CREATININE 5.16* 3.78* 2.99*  --   CALCIUM 8.1* 8.3* 8.2*  --   PHOS 3.6 2.8 2.8 3.0  HGB 10.3*  --  10.6*  --     Inpatient medications:  aspirin  81 mg Per Tube Daily   atorvastatin  80 mg Per Tube Daily   B-complex with vitamin C  1 tablet Per Tube Daily   chlorhexidine gluconate (MEDLINE KIT)  15 mL Mouth Rinse BID   Chlorhexidine Gluconate Cloth  6 each Topical Q0600   clonazePAM  0.5 mg Per Tube BID   docusate  100 mg Per Tube BID   feeding supplement (PROSource TF)  45 mL Per Tube BID   feeding supplement (VITAL HIGH PROTEIN)  1,000 mL Per Tube Q24H   insulin aspart  0-9 Units Subcutaneous Q4H   mouth rinse  15 mL Mouth Rinse 10 times per day   midodrine  15 mg Per Tube TID WC   ondansetron (ZOFRAN) IV  4 mg Intravenous Once   pantoprazole sodium  40 mg Per Tube  Daily   polyethylene glycol  17 g Per Tube Daily   rOPINIRole  0.25 mg Per Tube QHS   sodium chloride flush  3 mL Intravenous Q12H   ticagrelor  90 mg Per Tube BID     prismasol BGK 4/2.5 400 mL/hr at 09/18/21 0555    prismasol BGK 4/2.5 300 mL/hr at 09/17/21 2107   sodium chloride Stopped (09/13/21 1550)   sodium chloride     sodium chloride 10 mL/hr at 09/18/21 1000   sodium chloride 10 mL/hr at 09/18/21 1000   amiodarone 30 mg/hr (09/18/21 1000)   ceFEPime (MAXIPIME) IV Stopped (09/18/21 0851)   dexmedetomidine (PRECEDEX) IV infusion 0.5 mcg/kg/hr (09/18/21 1000)   DOPamine 10 mcg/kg/min (09/18/21 1000)   fentaNYL infusion INTRAVENOUS 200 mcg/hr (09/18/21 1000)   heparin 2,150 Units/hr (09/18/21 1000)   norepinephrine (LEVOPHED) Adult infusion 20 mcg/min (09/18/21 1000)   prismasol BGK 4/2.5 2,000 mL/hr at 09/18/21 0818   sodium chloride, sodium chloride, sodium chloride, sodium chloride, acetaminophen, alteplase, docusate, fentaNYL (SUBLIMAZE) injection, fentaNYL (SUBLIMAZE) injection, heparin, lidocaine (PF), lidocaine-prilocaine, meclizine, ondansetron (ZOFRAN) IV,  pentafluoroprop-tetrafluoroeth, polyethylene glycol, sodium chloride flush, sodium chloride flush

## 2021-09-18 NOTE — Progress Notes (Addendum)
NAME:  [name] [name], MRN:  [id number], DOB:  [birth year], LOS: 6 ADMISSION DATE:  09/25/2021, CONSULTATION DATE:  10/03/2021 REFERRING MD:  Kathrynn Humble CHIEF COMPLAINT: Syncope   History of Present Illness:  55 year old man who presented to [facility] 12/2 via EMS with nausea and vomiting, R sided CP and syncope on day of admission. PMHx significant for ESRD (on HD TTS via LUE AVF) c/b chronic hyperkalemia, advanced CHF, pulmonary hypertension, Afib, DVT (on warfarin).  On EMS evaluation, patient was bradycardic to 30s-40s prompting initiation of transcutaneous pacing (started en route). On ED arrival, arterial line was placed. Labs were notable for trop 4k, K 6.6 (iStat). Ca gluconate and 2 amps bicarb were administered. Levo was initiated. Patient remained dependent on transcutaneous pacing. Cardiology/EP and Nephrology were consulted. Patient was taken to cath lab 12/2 for TVP placement. Temp wires removed 12/5, however patient had recurrence of bradycardia/progressive pauses necessitating repeat TVP placement 12/5PM.  Pertinent Medical History:  ESRD DVT Chronic systolic and diastolic heart failure Multifactorial pulmonary hypertension CAD OSA (not using CPAP)  Significant Hospital Events: Including procedures, antibiotic start and stop dates in addition to other pertinent events   10/07/2021 arterial line placed, transcutaneous pacing, transvenous pacemaker placed in cath lab, Started on iHD 12/3 HD per TTS schedule 12/5 Temp wire pulled, AV conduction improved. L groin sheath removed. Bradycardic with progressive pauses later in the evening prompting replacement of TVP. Intubated for procedure with escalation of pressor/hemodynamic support. 12/6 PCM consulted.  12/7 VT overnight. Required Cardioversion.  Family conversation, brother coming from [place] to participate in goals of care remaining full code until brother can be present at bedside. Cefepime started for PNA. EP and advanced HF consulted.  IF he can be extubated and get off pressors leadless PM to be considered.  12/8 intermittently dropping blood pressure requiring escalating norepinephrine and dopamine.  Interim History / Subjective:  Looks about the same  Objective:  Blood pressure (Abnormal) 111/48, pulse 81, temperature 97.7 F (36.5 C), temperature source Oral, resp. rate (Abnormal) 26, height 5\' 7"  (1.702 m), weight 129.8 kg, SpO2 95 %.    Vent Mode: PRVC FiO2 (%):  [30 %-40 %] 30 % Set Rate:  [24 bmp] 24 bmp Vt Set:  [530 mL-540 mL] 540 mL PEEP:  [10 cmH20] 10 cmH20 Plateau Pressure:  [24 cmH20-27 cmH20] 27 cmH20   Intake/Output Summary (Last 24 hours) at 09/18/2021 0802 Last data filed at 09/18/2021 0700 Gross per 24 hour  Intake 2939.43 ml  Output 4561 ml  Net -1621.57 ml   Filed Weights   09/16/21 0400 09/17/21 0400 09/18/21 0400  Weight: 131.7 kg 131 kg 129.8 kg   Physical Examination:  General: Critically ill 55 year old male patient who remains on fairly high vasoactive drip support and full ventilator support. HEENT normocephalic atraumatic orally intubated no visible jugular venous distention Pulmonary: Coarse bilateral breath sounds diminished bases, remains on PEEP of 10 for body habitus Cardiac: Intrinsic rhythm in the 90s Abdomen soft not tender Extremities warm Neuro opens eyes will follow commands  Resolved Hospital Problem List:  Toxic metabolic encephalopathy due to under-dialysis and hypotension Hyperkalemia  Assessment & Plan:   Cardiogenic shock due to complete heart block with slow escape requiring TVPM and titration of NE NSTEMI  Chronic systolic and diastolic heart failure due to ICM CAD Plan Continuing norepinephrine with MAP goal of greater than 65 Currently on dopamine, might be reasonable to consider different inotropic agent but will defer to cardiology  Keeping TVP in place,  eventually if he can be extubated considering leadless pacemaker  Volume removal per CRRT   Continue Brilinta and atorvastatin  Holding goal-directed medical therapy given shock state continue systemic heparin  PNA (NOS)  PCT slow to trend down  Plan Cefepime since 12/7 Await cultures    Persistent atrial fibrillation  VT (12/7)  DVT RUL segmental PE CTA Chest 12/3 with isolated segmental PE in RUL. On Warfarin as outpatient. Plan Cont systemic heparin amiodarone infusion   Acute-on-chronic hypoxemic respiratory failure requiring mechanical ventilation OSA, untreated Likely OHS Patient has OSA, but does not utilize CPAP as an outpatient. Likely some degree of OHS.   Continuing full ventilator support  Hold PEEP at 10 given body habitus  PAD protocol RASS goal -1  VAP bundle  Per goals of care discussion we will hold off on extubation until brother arrives, but might be reasonable to try pressure support   ESRD with chronic, dialysis-dependent Chronic hypotension Complication of LUE AVF - Nephrology following, appreciate assistance Plan Continuing CRRT  If he were to survive would need to look at fistulogram/declotting versus [facility] Serial chemistries Correct lites as indicated increase midodrine to 15,  3 times daily  DM type 2  - uncontrolled  Hypoglycemic overnight; basal was held Plan Continue current sliding scale goal 140-180  Inadequate oral intake.  H/o MO Seen by RD. Recommending tubefeeds in next 24 hrs or so.  Plan Initiate tube feeds at trickle feed  Best Practice: (right click and "Reselect all SmartList Selections" daily)   Diet/type: Regular consistency (see orders) DVT prophylaxis: systemic heparin GI prophylaxis: PPI Lines: Central line Foley:  N/A Code Status: full code Last date of multidisciplinary goals of care discussion [will attempt to reach out to family today]  Critical care time: 31 minutes  Erick Colace ACNP-BC [place] Pager # [phone] OR # [phone] if no answer

## 2021-09-18 NOTE — Progress Notes (Signed)
Initial Nutrition Assessment  DOCUMENTATION CODES:   Morbid obesity  INTERVENTION:   Tube Feeding via OG:  Vital AF 1.2 at 50 ml/hr (continue Vital High Protein as bottle just hung until runs out then transition) Pro-source TF 90 mL TID Provides 156 g of protein, 1680 kcals and 972 mL of free water  Continue B complex with C    NUTRITION DIAGNOSIS:   Inadequate oral intake related to acute illness as evidenced by NPO status.  Being addressed via TF   GOAL:   Patient will meet greater than or equal to 90% of their needs  Progressing  MONITOR:   Vent status, Weight trends, TF tolerance, Labs  REASON FOR ASSESSMENT:   Ventilator    ASSESSMENT:   55 yo male admitted with unstable bradycardia, NSTEMI, cardipogenic shock, acute on chronic respiratory failure requiring intubation. PMH includes ESRD on HD, DVT, CHF, CAD, OSA, pulmonary HTN  12/02 Admitted 12/05 Intubated 12/07 VT overnight requiring cardioversion, Palliative Consulted  Remains full code, [place]  Pt remains on vent support; sedated, continues with levophed and dopamine. Requirements up overnight but relatively stable. Noted pt with chronic hypotension and baseline, on midodrine with SBP in 80s at outpatient HD Remains on CRRT  Vital High Protein at 40 ml/hr ordered by CCM per Adult TF protocol this AM via OG  No BM since admission, scheduled miralax and colace ordered today  Labs: CBGs 93-178 Meds: colace, miralax, ss novolog  Diet Order:   Diet Order             Diet NPO time specified  Diet effective now                   EDUCATION NEEDS:   Not appropriate for education at this time  Skin:  Skin Assessment: Skin Integrity Issues: Skin Integrity Issues:: Other (Comment) Other: unknown wounds on bilateral extremities  Last BM:  PTA  Height:   Ht Readings from Last 1 Encounters:  10/04/2021 5\' 7"  (1.702 m)    Weight:   Wt Readings from Last 1 Encounters:   09/18/21 129.8 kg     BMI:  Body mass index is 44.82 kg/m.  Estimated Nutritional Needs:   Kcal:  1500-1700  Protein:  135-165 g  Fluid:  1000 mL plus UOP  Kerman Passey MS, RDN, LDN, CNSC Registered Dietitian III Clinical Nutrition RD Pager and On-Call Pager Number Located in [place]

## 2021-09-18 NOTE — Progress Notes (Addendum)
  Advanced Heart Failure Rounding Note  PCP-Cardiologist: None   Subjective:    RN reports he is following commands.   Increase in pressor requirements overnight, Dopamine up to 10 and NE 20   Co-ox 47%.    Objective:   Weight Range: 129.8 kg Body mass index is 44.82 kg/m.   Vital Signs:   Temp:  [97.2 F (36.2 C)-98.8 F (37.1 C)] 98.4 F (36.9 C) (12/08 0800) Pulse Rate:  [65-98] 98 (12/08 0800) Resp:  [25-26] 26 (12/08 0730) BP: (90-142)/(40-64) 112/54 (12/08 0800) SpO2:  [91 %-100 %] 94 % (12/08 0800) Arterial Line BP: (83-163)/(36-71) 119/71 (12/08 0800) FiO2 (%):  [30 %-40 %] 30 % (12/08 0800) Weight:  [129.8 kg] 129.8 kg (12/08 0400) Last BM Date:  (pta)  Weight change: Filed Weights   09/16/21 0400 09/17/21 0400 09/18/21 0400  Weight: 131.7 kg 131 kg 129.8 kg    Intake/Output:   Intake/Output Summary (Last 24 hours) at 09/18/2021 1035 Last data filed at 09/18/2021 1000 Gross per 24 hour  Intake 3514.43 ml  Output 5109 ml  Net -1594.57 ml      Physical Exam    General:  Intubated and sedated HEENT: + ETT Neck: Supple. JVP difficult to assess d/t size . Carotids 2+ bilat; no bruits. No lymphadenopathy or thyromegaly appreciated. Cor: PMI nondisplaced. Regular rate & rhythm. No rubs, gallops or murmurs. Lungs: Course Abdomen: Soft, nontender, nondistended. No hepatosplenomegaly. No bruits or masses.  Extremities: No cyanosis, clubbing, rash, , R femoral HD cath, 1-2+ edema Neuro: Sedated on vent.    Telemetry   SR 80s-90s   Labs    CBC Recent Labs    09/17/21 0317 09/18/21 0156  WBC 9.5 9.0  HGB 10.3* 10.6*  HCT 33.2* 34.8*  MCV 96.2 97.5  PLT 312 306   Basic Metabolic Panel Recent Labs    09/17/21 1600 09/18/21 0156 09/18/21 0859  NA 133* 134*  --   K 4.6 4.4  --   CL 98 101  --   CO2 23 23  --   GLUCOSE 106* 123*  --   BUN 20 16  --   CREATININE 3.78* 2.99*  --   CALCIUM 8.3* 8.2*  --   MG  --  2.3 2.5*  PHOS 2.8  2.8 3.0   Liver Function Tests Recent Labs    09/17/21 0038 09/17/21 0317 09/17/21 1600 09/18/21 0156  AST 49*  --   --   --   ALT 35  --   --   --   ALKPHOS 57  --   --   --   BILITOT 1.8*  --   --   --   PROT 6.8  --   --   --   ALBUMIN 2.7*  2.7*   < > 2.4* 2.4*   < > = values in this interval not displayed.   No results for input(s): LIPASE, AMYLASE in the last 72 hours. Cardiac Enzymes No results for input(s): CKTOTAL, CKMB, CKMBINDEX, TROPONINI in the last 72 hours.  BNP: BNP (last 3 results) Recent Labs    10/05/2021 2020  BNP 3,842.7*    ProBNP (last 3 results) No results for input(s): PROBNP in the last 8760 hours.   D-Dimer No results for input(s): DDIMER in the last 72 hours. Hemoglobin A1C No results for input(s): HGBA1C in the last 72 hours. Fasting Lipid Panel Recent Labs    09/18/21 0156  CHOL 82  HDL 35*    LDLCALC 35  TRIG 60  CHOLHDL 2.3   Thyroid Function Tests No results for input(s): TSH, T4TOTAL, T3FREE, THYROIDAB in the last 72 hours.  Invalid input(s): FREET3  Other results:   Imaging    No results found.   Medications:     Scheduled Medications:  aspirin  81 mg Per Tube Daily   atorvastatin  80 mg Per Tube Daily   B-complex with vitamin C  1 tablet Per Tube Daily   chlorhexidine gluconate (MEDLINE KIT)  15 mL Mouth Rinse BID   Chlorhexidine Gluconate Cloth  6 each Topical Q0600   clonazePAM  0.5 mg Per Tube BID   docusate  100 mg Per Tube BID   feeding supplement (PROSource TF)  45 mL Per Tube BID   feeding supplement (VITAL HIGH PROTEIN)  1,000 mL Per Tube Q24H   insulin aspart  0-9 Units Subcutaneous Q4H   mouth rinse  15 mL Mouth Rinse 10 times per day   midodrine  15 mg Per Tube TID WC   ondansetron (ZOFRAN) IV  4 mg Intravenous Once   pantoprazole sodium  40 mg Per Tube Daily   polyethylene glycol  17 g Per Tube Daily   rOPINIRole  0.25 mg Per Tube QHS   sodium chloride flush  3 mL Intravenous Q12H    ticagrelor  90 mg Per Tube BID    Infusions:   prismasol BGK 4/2.5 400 mL/hr at 09/18/21 0555    prismasol BGK 4/2.5 300 mL/hr at 09/17/21 2107   sodium chloride Stopped (09/13/21 1550)   sodium chloride     sodium chloride 10 mL/hr at 09/18/21 1000   sodium chloride 10 mL/hr at 09/18/21 1000   amiodarone 30 mg/hr (09/18/21 1000)   ceFEPime (MAXIPIME) IV Stopped (09/18/21 0851)   dexmedetomidine (PRECEDEX) IV infusion 0.5 mcg/kg/hr (09/18/21 1000)   DOPamine 10 mcg/kg/min (09/18/21 1000)   fentaNYL infusion INTRAVENOUS 200 mcg/hr (09/18/21 1000)   heparin 2,150 Units/hr (09/18/21 1000)   norepinephrine (LEVOPHED) Adult infusion 20 mcg/min (09/18/21 1000)   prismasol BGK 4/2.5 2,000 mL/hr at 09/18/21 0818    PRN Medications: sodium chloride, sodium chloride, sodium chloride, sodium chloride, acetaminophen, alteplase, docusate, fentaNYL (SUBLIMAZE) injection, fentaNYL (SUBLIMAZE) injection, heparin, lidocaine (PF), lidocaine-prilocaine, meclizine, ondansetron (ZOFRAN) IV, pentafluoroprop-tetrafluoroeth, polyethylene glycol, sodium chloride flush, sodium chloride flush   Assessment/Plan  Shock - septic +/- cardiogenic -Echo this admit with EF 35-40% -Currently on Dopa 10 and NE 20. Increased requirements overnight.  Starting abx -No access to monitor co-ox and CVP -Chronic hypotension on midodrine, increased to 15 mg TID. SBP typically 80s during HD -Volume managed per HD. Weight down 2 lb overnight. -1.5L.    2. NSTEMI with known hx CAD -NSTEMI 10/21 s/p DES to LAD -S/p DES distal to previously placed LAD stent 05/22 in setting of elevated troponin during admit for HD catheter exchange -Has known occlusion of RI and OM1 -HS troponin up to 5,596 this admit. CP on admission in setting of hypotension and CHB -Potentially type II MI in setting of shock -On asa, brilinta and statin. Consider stopping aspirin when add back warfarin   3. CHB -In setting of hyperkalemia, K 6.6 on  presentation -Temporary pacer placed on admit, removed 12/05. Temp pacer replaced d/t recurrent CHB. -EP following. High risk of infection with PPM.  -If able to wean off pressors, extubate and has stable HD access may consider Micra Leadless pacemaker. Dr. Bensimhon discussed with EP   4. VT -Sustained VT 12/07   s/p synchronized cardioversion -Keep K >4 and Mag >2 -Now on amio gtt   5. Atrial fibrillation/flutter -No AF on tele -On amio gtt for sustained VT. No recurrent episodes overnight -Currently on heparin gtt, coumadin PTA   6. ESRD/hyperkalemia -K 6.6 on admit, corrected -Currently on CRRT. Has clotted L AV fistula and temp R HD cath -Per nephrology   7. Acute on chronic respiratory failure with hypoxia and hypercapnia -With underlying untreated OSA and OHS -Intubated prior to insertion of new temp pacing wire 12/05 -Vent management per CCM -12/07 start cefepmine for PNA - Follows commands per RN, did not arouse for me today  GOC: -Even if he recovers this admit, suspect overall mortality < 6-12 months. He has been seen by Palliative Care but he has no family in [place] and mother is in [place] and does not speak English.  -Remains full code -Brother coming in from [place] in next few days. No extubation until brother arrives.  Length of Stay: 6  FINCH, LINDSAY N, PA-C  09/18/2021, 10:35 AM  Advanced Heart Failure Team Pager [phone] (M-F; 7a - 5p)  Please contact [facility] for night-coverage after hours (5p -7a ) and weekends on [email or web address]    Agree with above.   Remains on vent. Increasing pressor requirement overnight. Has not used pacing (back-up rate 60).   Volume up. Pulling 100-200cc on CVVHD.   Remains in cefepime for PNA  General:  Sedated on vent  HEENT: normal + ETT Neck: supple. Hard to see JVP  Carotids 2+ bilat; no bruits. No lymphadenopathy or thryomegaly appreciated. Cor: PMI nondisplaced. Regular rate & rhythm. No rubs, gallops or  murmurs. Lungs: clear Abdomen: soft, nontender, nondistended. No hepatosplenomegaly. No bruits or masses. Good bowel sounds. Extremities: no cyanosis, clubbing, rash, 2+ edema.  RFV HD cath. LFV pacer Neuro: intubated sedated  He remains critically with predominantly septic shock. Continue abx and pressors. Rhythm stable. No pacing overnight. I checked pacer threshold personally and was 0.4.   If patient stabilizes can consider leadless RV pacer but he is currently a long way from that  Palliative Care discussions ongoing. Brother coming in from [place] to discuss.   CRITICAL CARE Performed by: Bensimhon, Daniel  Total critical care time: 45 minutes  Critical care time was exclusive of separately billable procedures and treating other patients.  Critical care was necessary to treat or prevent imminent or life-threatening deterioration.  Critical care was time spent personally by me (independent of midlevel providers or residents) on the following activities: development of treatment plan with patient and/or surrogate as well as nursing, discussions with consultants, evaluation of patient's response to treatment, examination of patient, obtaining history from patient or surrogate, ordering and performing treatments and interventions, ordering and review of laboratory studies, ordering and review of radiographic studies, pulse oximetry and re-evaluation of patient's condition.  Daniel Bensimhon, MD  5:48 PM   

## 2021-09-19 ENCOUNTER — Encounter: Payer: Medicare HMO | Admitting: Physical Therapy

## 2021-09-19 ENCOUNTER — Inpatient Hospital Stay (HOSPITAL_COMMUNITY): Payer: Medicare HMO

## 2021-09-19 DIAGNOSIS — J156 Pneumonia due to other aerobic Gram-negative bacteria: Secondary | ICD-10-CM | POA: Diagnosis not present

## 2021-09-19 DIAGNOSIS — Z992 Dependence on renal dialysis: Secondary | ICD-10-CM

## 2021-09-19 DIAGNOSIS — N186 End stage renal disease: Secondary | ICD-10-CM | POA: Diagnosis not present

## 2021-09-19 DIAGNOSIS — R570 Cardiogenic shock: Secondary | ICD-10-CM | POA: Diagnosis not present

## 2021-09-19 DIAGNOSIS — R001 Bradycardia, unspecified: Secondary | ICD-10-CM | POA: Diagnosis not present

## 2021-09-19 DIAGNOSIS — I214 Non-ST elevation (NSTEMI) myocardial infarction: Secondary | ICD-10-CM | POA: Diagnosis not present

## 2021-09-19 LAB — CBC WITH DIFFERENTIAL/PLATELET
Abs Immature Granulocytes: 0.05 10*3/uL (ref 0.00–0.07)
Basophils Absolute: 0.1 10*3/uL (ref 0.0–0.1)
Basophils Relative: 0 %
Eosinophils Absolute: 0.1 10*3/uL (ref 0.0–0.5)
Eosinophils Relative: 1 %
HCT: 37.7 % — ABNORMAL LOW (ref 39.0–52.0)
Hemoglobin: 11.7 g/dL — ABNORMAL LOW (ref 13.0–17.0)
Immature Granulocytes: 0 %
Lymphocytes Relative: 6 %
Lymphs Abs: 0.7 10*3/uL (ref 0.7–4.0)
MCH: 30.2 pg (ref 26.0–34.0)
MCHC: 31 g/dL (ref 30.0–36.0)
MCV: 97.2 fL (ref 80.0–100.0)
Monocytes Absolute: 1.1 10*3/uL — ABNORMAL HIGH (ref 0.1–1.0)
Monocytes Relative: 9 %
Neutro Abs: 9.9 10*3/uL — ABNORMAL HIGH (ref 1.7–7.7)
Neutrophils Relative %: 84 %
Platelets: 343 10*3/uL (ref 150–400)
RBC: 3.88 MIL/uL — ABNORMAL LOW (ref 4.22–5.81)
RDW: 23.1 % — ABNORMAL HIGH (ref 11.5–15.5)
WBC: 11.9 10*3/uL — ABNORMAL HIGH (ref 4.0–10.5)
nRBC: 0 % (ref 0.0–0.2)

## 2021-09-19 LAB — RENAL FUNCTION PANEL
Albumin: 2.3 g/dL — ABNORMAL LOW (ref 3.5–5.0)
Albumin: 2.3 g/dL — ABNORMAL LOW (ref 3.5–5.0)
Anion gap: 12 (ref 5–15)
Anion gap: 9 (ref 5–15)
BUN: 15 mg/dL (ref 6–20)
BUN: 17 mg/dL (ref 6–20)
CO2: 23 mmol/L (ref 22–32)
CO2: 23 mmol/L (ref 22–32)
Calcium: 8.2 mg/dL — ABNORMAL LOW (ref 8.9–10.3)
Calcium: 8.2 mg/dL — ABNORMAL LOW (ref 8.9–10.3)
Chloride: 101 mmol/L (ref 98–111)
Chloride: 102 mmol/L (ref 98–111)
Creatinine, Ser: 2.7 mg/dL — ABNORMAL HIGH (ref 0.61–1.24)
Creatinine, Ser: 2.65 mg/dL — ABNORMAL HIGH (ref 0.61–1.24)
GFR, Estimated: 27 mL/min — ABNORMAL LOW (ref >60)
GFR, Estimated: 27 mL/min — ABNORMAL LOW (ref >60)
Glucose, Bld: 180 mg/dL — ABNORMAL HIGH (ref 70–99)
Glucose, Bld: 224 mg/dL — ABNORMAL HIGH (ref 70–99)
Phosphorus: 2.8 mg/dL (ref 2.5–4.6)
Phosphorus: 2.8 mg/dL (ref 2.5–4.6)
Potassium: 4.2 mmol/L (ref 3.5–5.1)
Potassium: 4.4 mmol/L (ref 3.5–5.1)
Sodium: 136 mmol/L (ref 135–145)
Sodium: 134 mmol/L — ABNORMAL LOW (ref 135–145)

## 2021-09-19 LAB — MAGNESIUM
Magnesium: 2.5 mg/dL — ABNORMAL HIGH (ref 1.7–2.4)
Magnesium: 2.5 mg/dL — ABNORMAL HIGH (ref 1.7–2.4)

## 2021-09-19 LAB — GLUCOSE, CAPILLARY
Glucose-Capillary: 189 mg/dL — ABNORMAL HIGH (ref 70–99)
Glucose-Capillary: 175 mg/dL — ABNORMAL HIGH (ref 70–99)
Glucose-Capillary: 194 mg/dL — ABNORMAL HIGH (ref 70–99)
Glucose-Capillary: 227 mg/dL — ABNORMAL HIGH (ref 70–99)
Glucose-Capillary: 171 mg/dL — ABNORMAL HIGH (ref 70–99)
Glucose-Capillary: 229 mg/dL — ABNORMAL HIGH (ref 70–99)
Glucose-Capillary: 196 mg/dL — ABNORMAL HIGH (ref 70–99)
Glucose-Capillary: 240 mg/dL — ABNORMAL HIGH (ref 70–99)

## 2021-09-19 LAB — PHOSPHORUS
Phosphorus: 2.8 mg/dL (ref 2.5–4.6)
Phosphorus: 2.8 mg/dL (ref 2.5–4.6)

## 2021-09-19 LAB — HIV-1/2 AB - DIFFERENTIATION
HIV 1 Ab: NONREACTIVE
HIV 2 Ab: NONREACTIVE
Note: NEGATIVE

## 2021-09-19 LAB — PROCALCITONIN: Procalcitonin: 9.49 ng/mL

## 2021-09-19 LAB — HEPARIN LEVEL (UNFRACTIONATED): Heparin Unfractionated: 0.37 IU/mL (ref 0.30–0.70)

## 2021-09-19 MED ORDER — SORBITOL 70 % SOLN
45.0000 mL | Freq: Once | Status: AC
  Administered 2021-09-19: 45 mL
  Filled 2021-09-19: qty 60

## 2021-09-19 MED ORDER — DOCUSATE SODIUM 50 MG/5ML PO LIQD: 100.0000 mg | Freq: Two times a day (BID) | ORAL | Status: DC

## 2021-09-19 MED ORDER — FENTANYL CITRATE PF 50 MCG/ML IJ SOSY: 50.0000 ug | PREFILLED_SYRINGE | INTRAMUSCULAR | Status: DC | PRN

## 2021-09-19 MED ORDER — FENTANYL CITRATE PF 50 MCG/ML IJ SOSY
50.0000 ug | PREFILLED_SYRINGE | INTRAMUSCULAR | Status: DC | PRN
  Administered 2021-09-20: 100 ug via INTRAVENOUS
  Administered 2021-09-20 (×2): 50 ug via INTRAVENOUS
  Administered 2021-09-20: 100 ug via INTRAVENOUS
  Administered 2021-09-20: 50 ug via INTRAVENOUS
  Filled 2021-09-19 (×2): qty 2
  Filled 2021-09-19: qty 4

## 2021-09-19 MED ORDER — LACTATED RINGERS IV BOLUS: 750.0000 mL | Freq: Once | INTRAVENOUS | Status: DC

## 2021-09-19 MED ORDER — POLYETHYLENE GLYCOL 3350 17 G PO PACK: 17.0000 g | PACK | Freq: Every day | ORAL | Status: DC

## 2021-09-19 MED ORDER — INSULIN ASPART 100 UNIT/ML IJ SOLN
3.0000 [IU] | INTRAMUSCULAR | Status: DC
  Administered 2021-09-19 – 2021-09-21 (×12): 3 [IU] via SUBCUTANEOUS

## 2021-09-19 NOTE — Telephone Encounter (Signed)
Pt is overdue for INR check.Normally pt has INR completed at dialysis but pt is currently admitted. It is not seen in the chart that he is admitted but there are 2 charts for this pt and they are to be merged but have not been merged yet. The other chart reports pt has been admitted on 12/2 for syncope and collapse. Pt is still currently admitted. Will check on pt status next week.

## 2021-09-19 NOTE — Telephone Encounter (Signed)
No repsonse from pt.   Closed referral     Attempted to contact pt, unable to leave VM, VM box full.

## 2021-09-19 NOTE — Progress Notes (Signed)
Telemetry reviewed, SR 1st degree AVblock, occ PVC, continues to conduct 1:1 Remains intubated Pressor requirements continue to wax/wane though generally seem to be trending up Remains w/CRRT  Await family arrival and further [place] discussions  Should he make significant recovery, able to come off pressors and extubate, would plan for leadless device given no UE vascular access  Tommye Standard, PA_C

## 2021-09-19 NOTE — Progress Notes (Signed)
ANTICOAGULATION CONSULT NOTE   Pharmacy Consult:  Heparin Indication: atrial fibrillation  Allergies  Allergen Reactions   Ace Inhibitors Cough   Pork-Derived Products Other (See Comments)    Entry determined to be clinically insignificant: OK TO GIVE HEPARIN (has tolerated well in the past)  Patient had allergic episode in 1989 after he ate pork at a fair. He developed fever and rash and went to the ED, but he did not have breathing issues. Received heparin in the past. Patient ok with removing pork allergy and listing as intolerance instead.    Shellfish-Derived Products Rash    Per ED note 01/2021    Patient Measurements: Height: 5\' 7"  (170.2 cm) Weight: 130.3 kg (287 lb 4.2 oz) IBW/kg (Calculated) : 66.1 Heparin Dosing Weight: 104 kg  Vital Signs: Temp: 98.4 F (36.9 C) (12/09 0500) Temp Source: Oral (12/09 0500) BP: 99/43 (12/09 0815) Pulse Rate: 103 (12/09 0815)  Labs: Recent Labs    09/17/21 0317 09/17/21 1600 09/18/21 0156 09/18/21 1630 09/19/21 0622  HGB 10.3*  --  10.6*  --  11.7*  HCT 33.2*  --  34.8*  --  37.7*  PLT 312  --  306  --  343  HEPARINUNFRC 0.43  --  0.42  --  0.37  CREATININE 5.16*   < > 2.99* 2.71* 2.70*   < > = values in this interval not displayed.     Estimated Creatinine Clearance: 39.2 mL/min (A) (by C-G formula based on SCr of 2.7 mg/dL (H)).   Assessment: 54 YOM presented with nausea and vomiting for 2 days and syncope.  Patient was hypotensive and bradycardic, requiring transcutaneous pacing.  S/p cath with temporary transvenous pacemaker insertion.  Pharmacy consulted to start IV heparin while Coumadin is on hold.  INR is sub-therapeutic on admit. PE noted on chest CT.  Heparin level therapeutic at 0.37, CBC stable. No bleeding issues noted.   Goal of Therapy:  Heparin level 0.3-0.7 units/ml Monitor platelets by anticoagulation protocol: Yes   Plan:  Continue IV heparin at 2150 units/hr Daily heparin level and CBC  Erin Hearing PharmD., BCPS Clinical Pharmacist 09/19/2021 9:43 AM

## 2021-09-19 NOTE — Progress Notes (Signed)
[facility] Progress Note  Subjective:   CRRT running well; off currently after 72h run, to restart 2.6L net negative yesterday All 4K, K 4.2, P 2.8 Remains on NE and dopamine GOC meeting today upon arrival of family  Vitals:   09/19/21 0700 09/19/21 0715 09/19/21 0815 09/19/21 0816  BP:   (!) 99/43   Pulse: 85 85 (!) 103   Resp:   (!) 24   Temp:      TempSrc:      SpO2: 97% 96% 93% 95%  Weight:      Height:        Exam:   Intubted, sedated  Coarse BS b/l  regular  Abd soft ntnd no ascites   Ext no LE edema   Alert, NF, ox3    LUA AVF no bruit, temp HD cath R groin   OP HD: MW TTS   130kg  400/500  2/2 bath  LUE AVF    - calcitriol 2.25 ug tiw  - iron 57m weekly  - auryxia/ renvela binders  - lokelma 8.4 on non HD days     CXR 12/03 - vasc congestion, IS edema mild   CT 12/03 angio chest - diffuse GG changes, small lung volumes   Assessment/ Plan: Unstable bradycardia --> Asystole 09/14/2021 with replacement of temp venous pacemaker; per EP; also s/p VTach 09/17/21 req DCCV; per EP / cardiology Chronic hypotension, in shock now on 2 pressors ESRD: as above, THS.  Clotted LUE AVG, has R Fem Temp HD cath at this time.  Req CRRT for high K. Stable on 4K fluids,  Cont CRRT at this time, Clotted AVG - in LUE. IR following declot and/or TDC, not stable at this time for procedures; liley has lost the AVG given duration of clot Acute on chronic resp failure - AHRF s/p ETT 191/6during asytolic arrest, per CCM NSTEMI - ^Cleora Fleet per cardiology Volume - Cont  UF on CRRT Chronic syst/ diast CHF CAD  Atrial fib - per EP Acute encephalopathy - improved w/ dialysis Anemia ckd - cont Fe weekly , no esa needed (Hb > 10)  MBD ckd - trend Ca and PO on CRRT, P ok, replace if < 2 Chronic hyperkalemia: outpt [place] hold while on CRRT, K stable here  Cont CRRT current settings.  Palliative following for GOC.  Poor prognosis   RRexene Agent MD  09/19/2021, 8:44  AM   Recent Labs  Lab 09/18/21 0156 09/18/21 0859 09/18/21 1630 09/19/21 0622  K 4.4  --  4.8 4.2  BUN 16  --  15 15  CREATININE 2.99*  --  2.71* 2.70*  CALCIUM 8.2*  --  8.2* 8.2*  PHOS 2.8   < > 3.5 2.8  2.8  HGB 10.6*  --   --  11.7*   < > = values in this interval not displayed.    Inpatient medications:  aspirin  81 mg Per Tube Daily   atorvastatin  80 mg Per Tube Daily   B-complex with vitamin C  1 tablet Per Tube Daily   chlorhexidine gluconate (MEDLINE KIT)  15 mL Mouth Rinse BID   Chlorhexidine Gluconate Cloth  6 each Topical Q0600   clonazePAM  0.5 mg Per Tube BID   docusate  100 mg Per Tube BID   docusate  100 mg Per Tube BID   feeding supplement (PROSource TF)  90 mL Per Tube TID   insulin aspart  0-9 Units Subcutaneous Q4H  insulin aspart  2 Units Subcutaneous Q4H   mouth rinse  15 mL Mouth Rinse 10 times per day   midodrine  15 mg Per Tube TID WC   ondansetron (ZOFRAN) IV  4 mg Intravenous Once   pantoprazole sodium  40 mg Per Tube Daily   polyethylene glycol  17 g Per Tube Daily   polyethylene glycol  17 g Per Tube Daily   rOPINIRole  0.25 mg Per Tube QHS   sodium chloride flush  3 mL Intravenous Q12H   ticagrelor  90 mg Per Tube BID     prismasol BGK 4/2.5 400 mL/hr at 09/18/21 1857    prismasol BGK 4/2.5 300 mL/hr at 09/18/21 1427   sodium chloride Stopped (09/13/21 1550)   sodium chloride     sodium chloride 10 mL/hr at 09/19/21 0400   sodium chloride 10 mL/hr at 09/19/21 0200   amiodarone 30 mg/hr (09/19/21 0400)   ceFEPime (MAXIPIME) IV 2 g (09/19/21 0837)   dexmedetomidine (PRECEDEX) IV infusion 1 mcg/kg/hr (09/19/21 0828)   DOPamine 8 mcg/kg/min (09/19/21 0400)   feeding supplement (VITAL AF 1.2 CAL)     heparin 2,150 Units/hr (09/19/21 0400)   norepinephrine (LEVOPHED) Adult infusion 26 mcg/min (09/19/21 0830)   prismasol BGK 4/2.5 2,000 mL/hr at 09/19/21 0456   sodium chloride, sodium chloride, sodium chloride, sodium chloride,  acetaminophen, alteplase, docusate, fentaNYL (SUBLIMAZE) injection, fentaNYL (SUBLIMAZE) injection, heparin, lidocaine (PF), lidocaine-prilocaine, meclizine, ondansetron (ZOFRAN) IV, pentafluoroprop-tetrafluoroeth, polyethylene glycol, sodium chloride flush, sodium chloride flush

## 2021-09-19 NOTE — Progress Notes (Addendum)
Advanced Heart Failure Rounding Note  PCP-Cardiologist: None   Subjective:    Remains on 26 NE + 8 Dopamine  No access for CVP and co-ox monitoring  Remains on CVVH pulling at 100/hr. -2.6 L yesterday.   Procalcitonin 8.76>8.37>9.49.  Sputum culture with abundant GNR. Rare GPC and GPR. On cefepime.  Intubated. Not following commands this am.  Objective:   Weight Range: 130.3 kg Body mass index is 44.99 kg/m.   Vital Signs:   Temp:  [97.5 F (36.4 C)-98.4 F (36.9 C)] 98.4 F (36.9 C) (12/09 0500) Pulse Rate:  [32-103] 103 (12/09 0815) Resp:  [24] 24 (12/09 0815) BP: (99-138)/(41-57) 99/43 (12/09 0815) SpO2:  [92 %-100 %] 95 % (12/09 0816) Arterial Line BP: (75-159)/(30-63) 132/41 (12/09 0715) FiO2 (%):  [30 %] 30 % (12/09 0816) Weight:  [130.3 kg] 130.3 kg (12/09 0500) Last BM Date:  (pta)  Weight change: Filed Weights   09/17/21 0400 09/18/21 0400 09/19/21 0500  Weight: 131 kg 129.8 kg 130.3 kg    Intake/Output:   Intake/Output Summary (Last 24 hours) at 09/19/2021 0945 Last data filed at 09/19/2021 0700 Gross per 24 hour  Intake 2921.45 ml  Output 5514 ml  Net -2592.55 ml      Physical Exam    General:  Intubated and sedated.  HEENT: + ETT Neck: supple. JVP difficult to assess. Carotids 2+ bilat; no bruits.  Cor: PMI nondisplaced. Regular rate & rhythm with ectopy. No rubs, gallops or murmurs. Lungs: coarse Abdomen: soft, nontender, nondistended. No hepatosplenomegaly. Extremities: no cyanosis, clubbing, rash, edema, R fem HD cath Neuro: Sedated on vent. Not following commands.    Telemetry   SR 80s-90s, variable PVC burden (~10-20/min)   Labs    CBC Recent Labs    09/18/21 0156 09/19/21 0622  WBC 9.0 11.9*  NEUTROABS  --  9.9*  HGB 10.6* 11.7*  HCT 34.8* 37.7*  MCV 97.5 97.2  PLT 306 030   Basic Metabolic Panel Recent Labs    09/18/21 1630 09/19/21 0622  NA 135 136  K 4.8 4.2  CL 102 101  CO2 21* 23  GLUCOSE 199*  180*  BUN 15 15  CREATININE 2.71* 2.70*  CALCIUM 8.2* 8.2*  MG 2.4 2.5*  PHOS 3.5 2.8  2.8   Liver Function Tests Recent Labs    09/17/21 0038 09/17/21 0317 09/18/21 1630 09/19/21 0622  AST 49*  --   --   --   ALT 35  --   --   --   ALKPHOS 57  --   --   --   BILITOT 1.8*  --   --   --   PROT 6.8  --   --   --   ALBUMIN 2.7*  2.7*   < > 2.3* 2.3*   < > = values in this interval not displayed.   No results for input(s): LIPASE, AMYLASE in the last 72 hours. Cardiac Enzymes No results for input(s): CKTOTAL, CKMB, CKMBINDEX, TROPONINI in the last 72 hours.  BNP: BNP (last 3 results) Recent Labs    09/14/2021 2020  BNP 3,842.7*    ProBNP (last 3 results) No results for input(s): PROBNP in the last 8760 hours.   D-Dimer No results for input(s): DDIMER in the last 72 hours. Hemoglobin A1C No results for input(s): HGBA1C in the last 72 hours. Fasting Lipid Panel Recent Labs    09/18/21 0156  CHOL 82  HDL 35*  LDLCALC 35  TRIG  60  CHOLHDL 2.3   Thyroid Function Tests No results for input(s): TSH, T4TOTAL, T3FREE, THYROIDAB in the last 72 hours.  Invalid input(s): FREET3  Other results:   Imaging    DG Chest Port 1 View  Result Date: 09/19/2021 CLINICAL DATA:  Difficulty breathing EXAM: PORTABLE CHEST 1 VIEW COMPARISON:  Previous studies including the examination of 09/16/2021 FINDINGS: Transverse diameter of heart is increased. Tip endotracheal tube is 5.2 cm above the carina. Enteric tube is noted traversing the esophagus. There is interval decrease in pulmonary vascular congestion. There is blunting of left lateral CP angle. No new focal infiltrates are seen. There is no pneumothorax. IMPRESSION: Cardiomegaly. There is interval decrease in pulmonary vascular congestion and pulmonary edema. There are no new focal infiltrates. Small left pleural effusion. Electronically Signed   By: Elmer Picker M.D.   On: 09/19/2021 09:10     Medications:      Scheduled Medications:  aspirin  81 mg Per Tube Daily   atorvastatin  80 mg Per Tube Daily   B-complex with vitamin C  1 tablet Per Tube Daily   chlorhexidine gluconate (MEDLINE KIT)  15 mL Mouth Rinse BID   Chlorhexidine Gluconate Cloth  6 each Topical Q0600   clonazePAM  0.5 mg Per Tube BID   docusate  100 mg Per Tube BID   feeding supplement (PROSource TF)  90 mL Per Tube TID   insulin aspart  0-9 Units Subcutaneous Q4H   insulin aspart  3 Units Subcutaneous Q4H   mouth rinse  15 mL Mouth Rinse 10 times per day   midodrine  15 mg Per Tube TID WC   ondansetron (ZOFRAN) IV  4 mg Intravenous Once   pantoprazole sodium  40 mg Per Tube Daily   polyethylene glycol  17 g Per Tube Daily   rOPINIRole  0.25 mg Per Tube QHS   sodium chloride flush  3 mL Intravenous Q12H   sorbitol  45 mL Per Tube Once   ticagrelor  90 mg Per Tube BID    Infusions:   prismasol BGK 4/2.5 400 mL/hr at 09/18/21 1857    prismasol BGK 4/2.5 300 mL/hr at 09/18/21 1427   sodium chloride Stopped (09/13/21 1550)   sodium chloride     sodium chloride 10 mL/hr at 09/19/21 0400   sodium chloride 10 mL/hr at 09/19/21 0200   amiodarone 30 mg/hr (09/19/21 0400)   ceFEPime (MAXIPIME) IV 2 g (09/19/21 0837)   dexmedetomidine (PRECEDEX) IV infusion 1 mcg/kg/hr (09/19/21 0828)   DOPamine 8 mcg/kg/min (09/19/21 0400)   feeding supplement (VITAL AF 1.2 CAL)     heparin 2,150 Units/hr (09/19/21 0942)   norepinephrine (LEVOPHED) Adult infusion 26 mcg/min (09/19/21 0830)   prismasol BGK 4/2.5 2,000 mL/hr at 09/19/21 0456    PRN Medications: sodium chloride, sodium chloride, sodium chloride, sodium chloride, acetaminophen, alteplase, docusate, fentaNYL (SUBLIMAZE) injection, fentaNYL (SUBLIMAZE) injection, heparin, lidocaine (PF), lidocaine-prilocaine, meclizine, ondansetron (ZOFRAN) IV, pentafluoroprop-tetrafluoroeth, polyethylene glycol, sodium chloride flush, sodium chloride flush   Assessment/Plan  Shock - septic  +/- cardiogenic -Echo this admit with EF 35-40% -Currently on NE 26 and Dopamine 8 -No access to monitor co-ox and CVP -CCM treating for PNA -Chronic midodrine 15 mg TID.    2. NSTEMI with known hx CAD -NSTEMI 10/21 s/p DES to LAD -S/p DES distal to previously placed LAD stent 05/22 in setting of elevated troponin during admit for HD catheter exchange -Has known occlusion of RI and OM1 --Potentially type II MI  in setting of shock. HS troponin up to 5,596.  -On asa, brilinta and statin. Consider stopping aspirin when add back warfarin   3. CHB -In setting of hyperkalemia, K 6.6 on presentation -Temporary pacer placed on admit, removed 12/05. Temp pacer replaced d/t recurrent CHB. -SR. Not requiring pacing. -EP following. High risk of infection with PPM.  -If able to wean off pressors, extubate and has stable HD access may consider Micra Leadless pacemaker. No UE vascular access. Dr. Haroldine Laws discussed with EP   4. VT/PVCs -Sustained VT 12/07 s/p synchronized cardioversion -Keep K >4 and Mag >2 -Now on amio gtt   5. Atrial fibrillation/flutter -No AF on tele -On amio gtt for sustained VT. No recurrent episodes overnight -Currently on heparin gtt, coumadin PTA   6. ESRD/hyperkalemia -K 6.6 on admit, corrected -Currently on CRRT.  -Per nephrology   7. Acute on chronic respiratory failure with hypoxia and hypercapnia -With underlying untreated OSA and OHS -Intubated prior to insertion of new temp pacing wire 12/05 -Vent management per CCM -12/07 start cefepime for PNA - Sputum culture with abundant GNR, rare GPC and GPR  GOC: -Even if he recovers this admit, suspect overall mortality < 6-12 months. He has been seen by Palliative Care but he has no family in [place] and mother is in [place] and does not speak [place].  -Remains full code -Brother coming in from [place]. CCM attempting to arrange family meeting to discuss [place] today.  Length of Stay: 7  FINCH, LINDSAY N, PA-C   09/19/2021, 9:45 AM  Advanced Heart Failure Team Pager [phone] (M-F; 7a - 5p)  Please contact [facility] for night-coverage after hours (5p -7a ) and weekends on [email or web address]   Agree with above.   Remains on sedated on vent. On significant doses of NE and DA. PCT up. Sputum cx + GNR. Has TVP but has not required pacing. Remains on CVVHD.   General: Sedated on vent  HEENT: normal + ETT Neck: supple. Hard to see JVP. Carotids 2+ bilat; no bruits. No lymphadenopathy or thryomegaly appreciated. Cor: PMI nondisplaced. Regular rate & rhythm. No rubs, gallops or murmurs. Lungs: clear Abdomen: soft, nontender, nondistended. No hepatosplenomegaly. No bruits or masses. Good bowel sounds. Extremities: no cyanosis, clubbing, rash, tr edema R groin HD cath L groin pacer  Neuro: intubated sedated  Remains on pressors. Suspect primarily septic shock at this point but may have component of cardiogenic shock. Unable to check co-ox given no upper central access. Continue abx, continue CVVHD. Wean pressors as tolerated. Not requring pacing at this point.   Prognosis quite poor. If stabilizes can consider RV leadless pacer.   Palliative Care team involved.   CRITICAL CARE Performed by: Glori Bickers  Total critical care time: 35 minutes  Critical care time was exclusive of separately billable procedures and treating other patients.  Critical care was necessary to treat or prevent imminent or life-threatening deterioration.  Critical care was time spent personally by me (independent of midlevel providers or residents) on the following activities: development of treatment plan with patient and/or surrogate as well as nursing, discussions with consultants, evaluation of patient's response to treatment, examination of patient, obtaining history from patient or surrogate, ordering and performing treatments and interventions, ordering and review of laboratory studies, ordering and review of  radiographic studies, pulse oximetry and re-evaluation of patient's condition.  Glori Bickers, MD  5:14 PM

## 2021-09-19 NOTE — Progress Notes (Signed)
NAME:  [name] [name], MRN:  [id number], DOB:  1966/05/15, LOS: 7 ADMISSION DATE:  10/09/2021, CONSULTATION DATE:  09/17/2021 REFERRING MD:  Kathrynn Humble CHIEF COMPLAINT: Syncope   History of Present Illness:  55 year old man who presented [facility] 12/[facility] via EMS with nausea and vomiting, R sided CP and syncope on day of admission. PMHx significant for ESRD (on HD TTS via LUE AVF) c/b chronic hyperkalemia, advanced CHF, pulmonary hypertension, Afib, DVT (on warfarin).  On EMS evaluation, patient was bradycardic to 30s-40s prompting initiation of transcutaneous pacing (started en route). On ED arrival, arterial line was placed. Labs were notable for trop 4k, K 6.6 (iStat). Ca gluconate and 2 amps bicarb were administered. Levo was initiated. Patient remained dependent on transcutaneous pacing. Cardiology/EP and Nephrology were consulted. Patient was taken to cath lab 12/2 for TVP placement. Temp wires removed 12/5, however patient had recurrence of bradycardia/progressive pauses necessitating repeat TVP placement 12/5PM.  Pertinent Medical History:  ESRD DVT Chronic systolic and diastolic heart failure Multifactorial pulmonary hypertension CAD OSA (not using CPAP)  Significant Hospital Events: Including procedures, antibiotic start and stop dates in addition to other pertinent events   10/09/2021 arterial line placed, transcutaneous pacing, transvenous pacemaker placed in cath lab, Started on iHD 12/3 HD per TTS schedule 12/5 Temp wire pulled, AV conduction improved. L groin sheath removed. Bradycardic with progressive pauses later in the evening prompting replacement of TVP. Intubated for procedure with escalation of pressor/hemodynamic support. 12/6 PCM consulted.  12/7 VT overnight. Required Cardioversion.  Family conversation, brother coming from [place] to participate in goals of care remaining full code until brother can be present at bedside. Cefepime started for PNA. EP and advanced HF consulted.  IF he can be extubated and get off pressors leadless PM to be considered.  12/8 intermittently dropping blood pressure requiring escalating norepinephrine and dopamine. Precedex started  12/9 cxr improved. Pct still elevated. Resp Cultures w/ many GNR (still pending) . Dc fent.gtt starting PRN  Interim History / Subjective:  No sig changes but is having more freq bigeminy and trigeminy   Objective:  Blood pressure (Abnormal) 99/43, pulse (Abnormal) 103, temperature 98.4 F (36.9 C), temperature source Oral, resp. rate (Abnormal) 24, height 5\' 7"  (1.702 m), weight 130.3 kg, SpO2 95 %.    Vent Mode: PRVC FiO2 (%):  [30 %] 30 % Set Rate:  [24 bmp] 24 bmp Vt Set:  [540 mL] 540 mL PEEP:  [8 cmH20-10 cmH20] 8 cmH20 Plateau Pressure:  [23 cmH20-26 cmH20] 23 cmH20   Intake/Output Summary (Last 24 hours) at 09/19/2021 0828 Last data filed at 09/19/2021 0700 Gross per 24 hour  Intake 3275.42 ml  Output 5736 ml  Net -2460.58 ml   Filed Weights   09/17/21 0400 09/18/21 0400 09/19/21 0500  Weight: 131 kg 129.8 kg 130.3 kg   Physical Examination:  General more sedated today. Had received up-titration of sedation last night  HENT NCAT no JVD  Pulm coarse. Dec'd peep to 8 PCXR personally reviewed: ett good position. Fairly significant improvement in bilateral aeration  Card irreg irreg w/ freq bigeminy and trigeminy  Abd soft  Ext warm  Neuro sedated this am  GU anuric   Resolved Hospital Problem List:  Toxic metabolic encephalopathy due to under-dialysis and hypotension Hyperkalemia  Assessment & Plan:   Cardiogenic shock due to complete heart block with slow escape requiring TVPM and titration of NE NSTEMI  Chronic systolic and diastolic heart failure due to ICM CAD Plan Cont NE and  dopamine. Change BP goal to SBP > 100 I think wean NE down first then work on Dopamine given his low EF Keep TVP in place.  Cont Brilinta and atorvastatin  Not candidate for GDMT given hypotension   IV heparin  Volume removal via CRRT  PNA (NOS)  PCT still elevated Plan Cefepime day 3; sputum w/ abundant GNR rare gpc and gpr. Will cont to follow cultures    Persistent atrial fibrillation  VT (12/7)  DVT RUL segmental PE CTA Chest 12/3 with isolated segmental PE in RUL. On Warfarin as outpatient. Plan Amiodarone IV heparin   Acute-on-chronic hypoxemic respiratory failure requiring mechanical ventilation OSA, untreated Likely OHS Patient has OSA, but does not utilize CPAP as an outpatient. Likely some degree of OHS.   Plan Dec peep to 8 PAD protocol RASS goal 0- to -1 Allow for PSV but holding off on extubation  Need to stop fent and change to PRN. Will order Trying to hold off on extubation until brother arrives from [place]. Hoping this will be over weekend. His friend is primary contact at this point. I have asked nursing to get update on ETA of brother from him.    ESRD with chronic, dialysis-dependent Chronic hypotension Complication of LUE AVF - Nephrology following, appreciate assistance Plan CRRT If he were to survive would need fistulagram/declotting of fistula vs catheter Serial chems Hold midodrine at 15 tid    DM type 2  - uncontrolled  Hypoglycemic overnight; basal was held Plan Cont ssi 140-180 Cont current tubefeed coverage   Inadequate oral intake.  H/o MO Seen by RD. Recommending tubefeeds in next 24 hrs or so.  Plan Adv tubefeeds  Best Practice: (right click and "Reselect all SmartList Selections" daily)   Diet/type: tubefeeds DVT prophylaxis: systemic heparin GI prophylaxis: PPI Lines: Central line Foley:  N/A Code Status: full code Last date of multidisciplinary goals of care discussion [will attempt to reach out to family today]  Critical care time: 37  minutes  Erick Colace ACNP-BC [place] Pager # [phone] OR # [phone] if no answer

## 2021-09-20 DIAGNOSIS — J9602 Acute respiratory failure with hypercapnia: Secondary | ICD-10-CM

## 2021-09-20 DIAGNOSIS — R570 Cardiogenic shock: Secondary | ICD-10-CM | POA: Diagnosis not present

## 2021-09-20 DIAGNOSIS — N186 End stage renal disease: Secondary | ICD-10-CM | POA: Diagnosis not present

## 2021-09-20 DIAGNOSIS — J9601 Acute respiratory failure with hypoxia: Secondary | ICD-10-CM | POA: Diagnosis not present

## 2021-09-20 DIAGNOSIS — I442 Atrioventricular block, complete: Secondary | ICD-10-CM | POA: Diagnosis not present

## 2021-09-20 DIAGNOSIS — R001 Bradycardia, unspecified: Secondary | ICD-10-CM | POA: Diagnosis not present

## 2021-09-20 LAB — POCT I-STAT 7, (LYTES, BLD GAS, ICA,H+H)
Acid-base deficit: 2 mmol/L (ref 0.0–2.0)
Bicarbonate: 22.6 mmol/L (ref 20.0–28.0)
Calcium, Ion: 1.13 mmol/L — ABNORMAL LOW (ref 1.15–1.40)
HCT: 33 % — ABNORMAL LOW (ref 39.0–52.0)
Hemoglobin: 11.2 g/dL — ABNORMAL LOW (ref 13.0–17.0)
O2 Saturation: 99 %
Patient temperature: 97.8
Potassium: 4.2 mmol/L (ref 3.5–5.1)
Sodium: 138 mmol/L (ref 135–145)
TCO2: 24 mmol/L (ref 22–32)
pCO2 arterial: 38.1 mmHg (ref 32.0–48.0)
pH, Arterial: 7.38 (ref 7.350–7.450)
pO2, Arterial: 134 mmHg — ABNORMAL HIGH (ref 83.0–108.0)

## 2021-09-20 LAB — RENAL FUNCTION PANEL
Albumin: 2.1 g/dL — ABNORMAL LOW (ref 3.5–5.0)
Albumin: 2 g/dL — ABNORMAL LOW (ref 3.5–5.0)
Anion gap: 9 (ref 5–15)
Anion gap: 9 (ref 5–15)
BUN: 19 mg/dL (ref 6–20)
BUN: 23 mg/dL — ABNORMAL HIGH (ref 6–20)
CO2: 24 mmol/L (ref 22–32)
CO2: 25 mmol/L (ref 22–32)
Calcium: 7.9 mg/dL — ABNORMAL LOW (ref 8.9–10.3)
Calcium: 8.5 mg/dL — ABNORMAL LOW (ref 8.9–10.3)
Chloride: 101 mmol/L (ref 98–111)
Chloride: 102 mmol/L (ref 98–111)
Creatinine, Ser: 2.43 mg/dL — ABNORMAL HIGH (ref 0.61–1.24)
Creatinine, Ser: 2.26 mg/dL — ABNORMAL HIGH (ref 0.61–1.24)
GFR, Estimated: 30 mL/min — ABNORMAL LOW (ref >60)
GFR, Estimated: 33 mL/min — ABNORMAL LOW (ref >60)
Glucose, Bld: 291 mg/dL — ABNORMAL HIGH (ref 70–99)
Glucose, Bld: 300 mg/dL — ABNORMAL HIGH (ref 70–99)
Phosphorus: 2.6 mg/dL (ref 2.5–4.6)
Phosphorus: 2.9 mg/dL (ref 2.5–4.6)
Potassium: 4.2 mmol/L (ref 3.5–5.1)
Potassium: 4.6 mmol/L (ref 3.5–5.1)
Sodium: 134 mmol/L — ABNORMAL LOW (ref 135–145)
Sodium: 136 mmol/L (ref 135–145)

## 2021-09-20 LAB — GLUCOSE, CAPILLARY
Glucose-Capillary: 169 mg/dL — ABNORMAL HIGH (ref 70–99)
Glucose-Capillary: 308 mg/dL — ABNORMAL HIGH (ref 70–99)
Glucose-Capillary: 267 mg/dL — ABNORMAL HIGH (ref 70–99)
Glucose-Capillary: 283 mg/dL — ABNORMAL HIGH (ref 70–99)
Glucose-Capillary: 293 mg/dL — ABNORMAL HIGH (ref 70–99)
Glucose-Capillary: 289 mg/dL — ABNORMAL HIGH (ref 70–99)
Glucose-Capillary: 258 mg/dL — ABNORMAL HIGH (ref 70–99)
Glucose-Capillary: 281 mg/dL — ABNORMAL HIGH (ref 70–99)
Glucose-Capillary: 267 mg/dL — ABNORMAL HIGH (ref 70–99)

## 2021-09-20 LAB — CBC
HCT: 36.6 % — ABNORMAL LOW (ref 39.0–52.0)
Hemoglobin: 11 g/dL — ABNORMAL LOW (ref 13.0–17.0)
MCH: 29.5 pg (ref 26.0–34.0)
MCHC: 30.1 g/dL (ref 30.0–36.0)
MCV: 98.1 fL (ref 80.0–100.0)
Platelets: 340 10*3/uL (ref 150–400)
RBC: 3.73 MIL/uL — ABNORMAL LOW (ref 4.22–5.81)
RDW: 23.1 % — ABNORMAL HIGH (ref 11.5–15.5)
WBC: 11.6 10*3/uL — ABNORMAL HIGH (ref 4.0–10.5)
nRBC: 0 % (ref 0.0–0.2)

## 2021-09-20 LAB — CULTURE, RESPIRATORY W GRAM STAIN: Gram Stain: NONE SEEN

## 2021-09-20 LAB — MAGNESIUM: Magnesium: 2.5 mg/dL — ABNORMAL HIGH (ref 1.7–2.4)

## 2021-09-20 LAB — HEPARIN LEVEL (UNFRACTIONATED)
Heparin Unfractionated: 0.9 IU/mL — ABNORMAL HIGH (ref 0.30–0.70)
Heparin Unfractionated: 0.86 IU/mL — ABNORMAL HIGH (ref 0.30–0.70)
Heparin Unfractionated: 0.64 IU/mL (ref 0.30–0.70)

## 2021-09-20 MED ORDER — LACTULOSE 10 GM/15ML PO SOLN: 10.0000 g | Freq: Every day | ORAL | Status: DC

## 2021-09-20 MED ORDER — FENTANYL 2500MCG IN NS 250ML (10MCG/ML) PREMIX INFUSION
50.0000 ug/h | INTRAVENOUS | Status: DC
  Administered 2021-09-20: 50 ug/h via INTRAVENOUS
  Administered 2021-09-21: 150 ug/h via INTRAVENOUS
  Administered 2021-09-22: 300 ug/h via INTRAVENOUS
  Administered 2021-09-22: 400 ug/h via INTRAVENOUS
  Administered 2021-09-22: 300 ug/h via INTRAVENOUS
  Filled 2021-09-20 (×6): qty 250

## 2021-09-20 MED ORDER — LACTULOSE 10 GM/15ML PO SOLN
10.0000 g | Freq: Two times a day (BID) | ORAL | Status: DC
  Administered 2021-09-20 – 2021-09-21 (×3): 10 g
  Filled 2021-09-20 (×3): qty 15

## 2021-09-20 MED ORDER — FENTANYL BOLUS VIA INFUSION
50.0000 ug | INTRAVENOUS | Status: DC | PRN
  Administered 2021-09-20 – 2021-09-21 (×4): 50 ug via INTRAVENOUS
  Administered 2021-09-21: 100 ug via INTRAVENOUS
  Administered 2021-09-21: 50 ug via INTRAVENOUS
  Administered 2021-09-21 (×2): 100 ug via INTRAVENOUS
  Administered 2021-09-21 (×4): 50 ug via INTRAVENOUS
  Administered 2021-09-22 (×2): 100 ug via INTRAVENOUS
  Administered 2021-09-22: 50 ug via INTRAVENOUS
  Administered 2021-09-22 (×6): 100 ug via INTRAVENOUS
  Filled 2021-09-20: qty 100

## 2021-09-20 MED ORDER — BISACODYL 10 MG RE SUPP: 10.0000 mg | Freq: Every day | RECTAL | Status: DC | PRN

## 2021-09-20 MED ORDER — CALCIUM GLUCONATE-NACL 1-0.675 GM/50ML-% IV SOLN
1.0000 g | Freq: Once | INTRAVENOUS | Status: AC
  Administered 2021-09-20: 1000 mg via INTRAVENOUS
  Filled 2021-09-20: qty 50

## 2021-09-20 MED ORDER — FENTANYL CITRATE PF 50 MCG/ML IJ SOSY
50.0000 ug | PREFILLED_SYRINGE | Freq: Once | INTRAMUSCULAR | Status: AC
  Administered 2021-09-20: 50 ug via INTRAVENOUS

## 2021-09-20 NOTE — Progress Notes (Signed)
NAME:  [name] [name], MRN:  [id number], DOB:  Mar 16, 1966, LOS: 73 ADMISSION DATE:  09/26/2021, CONSULTATION DATE:  09/16/2021 REFERRING MD:  Kathrynn Humble CHIEF COMPLAINT: Syncope   History of Present Illness:  55 year old man who presented to [facility] 12/2 via EMS with nausea and vomiting, R sided CP and syncope on day of admission. PMHx significant for ESRD (on HD TTS via LUE AVF) c/b chronic hyperkalemia, advanced CHF, pulmonary hypertension, Afib, DVT (on warfarin).  On EMS evaluation, patient was bradycardic to 30s-40s prompting initiation of transcutaneous pacing (started en route). On ED arrival, arterial line was placed. Labs were notable for trop 4k, K 6.6 (iStat). Ca gluconate and 2 amps bicarb were administered. Levo was initiated. Patient remained dependent on transcutaneous pacing. Cardiology/EP and Nephrology were consulted. Patient was taken to cath lab 12/2 for TVP placement. Temp wires removed 12/5, however patient had recurrence of bradycardia/progressive pauses necessitating repeat TVP placement 12/5PM.  Pertinent Medical History:  ESRD DVT Chronic systolic and diastolic heart failure Multifactorial pulmonary hypertension CAD OSA (not using CPAP)  Significant Hospital Events: Including procedures, antibiotic start and stop dates in addition to other pertinent events   09/13/2021 arterial line placed, transcutaneous pacing, transvenous pacemaker placed in cath lab, Started on iHD 12/3 HD per TTS schedule 12/5 Temp wire pulled, AV conduction improved. L groin sheath removed. Bradycardic with progressive pauses later in the evening prompting replacement of TVP. Intubated for procedure with escalation of pressor/hemodynamic support. 12/6 PCM consulted.  12/7 VT overnight. Required Cardioversion.  Family conversation, brother coming from [place] to participate in goals of care remaining full code until brother can be present at bedside. Cefepime started for PNA. EP and advanced HF consulted.  IF he can be extubated and get off pressors leadless PM to be considered.  12/8 intermittently dropping blood pressure requiring escalating norepinephrine and dopamine. Precedex started  12/9 cxr improved. Pct still elevated. Resp Cultures w/ many GNR (still pending) . Dc fent.gtt starting PRN  Interim History / Subjective:  Norepinephrine at 30 Dopamine at 10 Waking up more.  Amiodarone gt  No BM. Desaturates and becomes unstable with minimal care and repositioning. Objective:  Blood pressure (!) 96/46, pulse 84, temperature 98.2 F (36.8 C), temperature source Oral, resp. rate (!) 26, height 5\' 7"  (1.702 m), weight 127.8 kg, SpO2 95 %.    Vent Mode: PRVC FiO2 (%):  [30 %-70 %] 70 % Set Rate:  [24 bmp] 24 bmp Vt Set:  [540 mL] 540 mL PEEP:  [8 cmH20] 8 cmH20 Plateau Pressure:  [20 cmH20-23 cmH20] 22 cmH20   Intake/Output Summary (Last 24 hours) at 09/20/2021 0911 Last data filed at 09/20/2021 0800 Gross per 24 hour  Intake 4666.71 ml  Output 5625 ml  Net -958.29 ml   Filed Weights   09/18/21 0400 09/19/21 0500 09/20/21 0500  Weight: 129.8 kg 130.3 kg 127.8 kg   Physical Examination:  Gen:      Intubated, sedated, acutely ill appearing, morbidly obese HEENT:  ETT to vent Lungs:    sounds of mechanical ventilation auscultated, diminished bilateraly CV:         RRR, diminished and unable to appreciate mrg Abd:      Obese, hypoactive bowel sounds; soft, non-tender; no palpable masses Ext:    Non pitting edema Skin:      Warm and dry; no rashes Neuro:   sedated, RASS -2, moves all 4 extremities   Resolved Hospital Problem List:  Toxic metabolic encephalopathy due to under-dialysis and  hypotension Hyperkalemia  Assessment & Plan:   Cardiogenic shock due to complete heart block with slow escape requiring TVPM and titration of NE NSTEMI  Chronic systolic and diastolic heart failure due to ICM Grade 3 diastolic dysfunction CAD Plan Cont NE and dopamine. BP goal to SBP  > 100 Keep TVP in place.  Cont Brilinta and atorvastatin  Not candidate for GDMT given hypotension  IV heparin  Volume removal via CRRT at net 100/hour  CAP PCT still elevated Plan Cefepime day 4 today, sputum culture shows diptheroids. continue  Persistent atrial fibrillation  VT (12/7)  DVT RUL segmental PE CTA Chest 12/3 with isolated segmental PE in RUL. On Warfarin as outpatient. Plan Continue Amiodarone Continue IV heparin   Acute-on-chronic hypoxemic respiratory failure requiring mechanical ventilation OSA, untreated Likely OHS Patient has OSA, but does not utilize CPAP as an outpatient. Likely some degree of OHS.   Plan Dec peep to 8 PAD protocol RASS goal 0- to -1 Allow for PSV but holding off on extubation  Need to stop fent and change to PRN. Will order Trying to hold off on extubation until brother arrives from [place]. Hoping this will be over weekend. His friend is primary contact at this point. I have asked nursing to get update on ETA of brother from him.    ESRD with chronic, dialysis-dependent Chronic hypotension Complication of LUE AVF - Nephrology following, appreciate assistance Plan Continue CRRT with volume removal as tolerated If he were to survive would need fistulagram/declotting of fistula vs catheter Serial chems continue midodrine at 15 tid    DM type 2  - CBG 140-180, at goal  Plan Cont ssi 140-180 Cont current tubefeed coverage   Inadequate oral intake.  H/o MO  Plan Continue tube feeds at goal  Best Practice: (right click and "Reselect all SmartList Selections" daily)   Diet/type: tubefeeds DVT prophylaxis: systemic heparin GI prophylaxis: PPI Lines: Central line Foley:  N/A Code Status: full code Last date of multidisciplinary goals of care discussion [brother due at bedside on Sunday. Will update in person. Currently in transit to the [place].]  Critical care time: 35  minutes   The patient is critically ill due to  respiratory failure, renal failure, heart failure, complete heart block.  Critical care was necessary to treat or prevent imminent or life-threatening deterioration.  Critical care was time spent personally by me on the following activities: development of treatment plan with patient and/or surrogate as well as nursing, discussions with consultants, evaluation of patient's response to treatment, examination of patient, obtaining history from patient or surrogate, ordering and performing treatments and interventions, ordering and review of laboratory studies, ordering and review of radiographic studies, pulse oximetry, re-evaluation of patient's condition and participation in multidisciplinary rounds.   Critical Care Time devoted to patient care services described in this note is 35 minutes. This time reflects time of care of this [place] . This critical care time does not reflect separately billable procedures or procedure time, teaching time or supervisory time of PA/NP/Med student/Med Resident etc but could involve care discussion time.       Spero Geralds [facility] 09/20/2021 2:40 PM  Pager: see AMION  If no response to pager , please call critical care on call (see AMION) until 7pm After 7:00 pm call Elink

## 2021-09-20 NOTE — Progress Notes (Signed)
[facility] Progress Note  Subjective:   CRRT running well now clotting about q24h not on AC 1.2L net negative yesterday All 4K, K 4.2, P 2.6 Remains on NE and dopamine GOC meeting today upon arrival of family now Sunday  Vitals:   09/20/21 0600 09/20/21 0800 09/20/21 0801 09/20/21 0802  BP: (!) 116/52 (!) 96/46 (!) 96/46   Pulse: 83 84 84   Resp:  (!) 26 (!) 26   Temp:  98.2 F (36.8 C)    TempSrc:  Oral    SpO2: 98% 96% 94% 95%  Weight:      Height:        Exam:   Intubted, sedated  Coarse BS b/l  regular  Abd soft ntnd no ascites   Ext no LE edema   Alert, NF, ox3    LUA AVF no bruit, temp HD cath R groin   OP HD: MW TTS   130kg  400/500  2/2 bath  LUE AVF    - calcitriol 2.25 ug tiw  - iron 36m weekly  - auryxia/ renvela binders  - lokelma 8.4 on non HD days     CXR 12/03 - vasc congestion, IS edema mild   CT 12/03 angio chest - diffuse GG changes, small lung volumes   Assessment/ Plan: Unstable bradycardia --> Asystole 09/19/2021 with replacement of temp venous pacemaker; per EP; also s/p VTach 09/17/21 req DCCV; per EP / cardiology Chronic hypotension, in shock now on 2 pressors ESRD: as above, THS.  Clotted LUE AVG, has R Fem Temp HD cath at this time.  Req CRRT for high K. Stable on 4K fluids,  Cont CRRT at this time, Clotted AVG - in LUE. IR following declot and/or TDC, not stable at this time for procedures; likely has lost the AVG given duration of clot Acute on chronic resp failure - AHRF s/p ETT 183/6during asytolic arrest, per CCM NSTEMI - ^Cleora Fleet per cardiology Volume - Cont  UF on CRRT Chronic syst/ diast CHF CAD  Atrial fib - per EP Acute encephalopathy - improved w/ dialysis Anemia ckd - cont Fe weekly , no esa needed (Hb > 10)  MBD ckd - trend Ca and PO on CRRT, P ok, replace if < 2 Chronic hyperkalemia: outpt [place] hold while on CRRT, K stable here  Cont CRRT current settings.  Palliative following for GOC.  Poor prognosis    RRexene Agent MD  09/20/2021, 9:55 AM   Recent Labs  Lab 09/19/21 0622 09/19/21 1600 09/19/21 1649 09/20/21 0435  K 4.2 4.4  --  4.2  BUN 15 17  --  19  CREATININE 2.70* 2.65*  --  2.43*  CALCIUM 8.2* 8.2*  --  7.9*  PHOS 2.8  2.8 2.8 2.8 2.6  HGB 11.7*  --   --  11.0*    Inpatient medications:  aspirin  81 mg Per Tube Daily   atorvastatin  80 mg Per Tube Daily   B-complex with vitamin C  1 tablet Per Tube Daily   chlorhexidine gluconate (MEDLINE KIT)  15 mL Mouth Rinse BID   Chlorhexidine Gluconate Cloth  6 each Topical Q0600   clonazePAM  0.5 mg Per Tube BID   docusate  100 mg Per Tube BID   feeding supplement (PROSource TF)  90 mL Per Tube TID   insulin aspart  0-9 Units Subcutaneous Q4H   insulin aspart  3 Units Subcutaneous Q4H   lactulose  10 g Per Tube  BID   mouth rinse  15 mL Mouth Rinse 10 times per day   midodrine  15 mg Per Tube TID WC   ondansetron (ZOFRAN) IV  4 mg Intravenous Once   pantoprazole sodium  40 mg Per Tube Daily   polyethylene glycol  17 g Per Tube Daily   rOPINIRole  0.25 mg Per Tube QHS   sodium chloride flush  3 mL Intravenous Q12H   ticagrelor  90 mg Per Tube BID     prismasol BGK 4/2.5 400 mL/hr at 09/19/21 2259    prismasol BGK 4/2.5 300 mL/hr at 09/20/21 0307   sodium chloride Stopped (09/13/21 1550)   sodium chloride     sodium chloride 10 mL/hr at 09/20/21 0800   sodium chloride 10 mL/hr at 09/19/21 0200   amiodarone 30 mg/hr (09/20/21 0824)   calcium gluconate 1,000 mg (09/20/21 0936)   ceFEPime (MAXIPIME) IV 2 g (09/20/21 0816)   dexmedetomidine (PRECEDEX) IV infusion 1.2 mcg/kg/hr (09/20/21 0800)   DOPamine 7 mcg/kg/min (09/20/21 0800)   feeding supplement (VITAL AF 1.2 CAL) 50 mL/hr at 09/20/21 0400   fentaNYL infusion INTRAVENOUS     heparin 1,900 Units/hr (09/20/21 0827)   norepinephrine (LEVOPHED) Adult infusion 30 mcg/min (09/20/21 0922)   prismasol BGK 4/2.5 2,000 mL/hr at 09/20/21 0135   sodium chloride, sodium  chloride, sodium chloride, sodium chloride, acetaminophen, alteplase, bisacodyl, docusate, fentaNYL, fentaNYL (SUBLIMAZE) injection, fentaNYL (SUBLIMAZE) injection, heparin, lidocaine (PF), lidocaine-prilocaine, ondansetron (ZOFRAN) IV, pentafluoroprop-tetrafluoroeth, polyethylene glycol, sodium chloride flush, sodium chloride flush

## 2021-09-20 NOTE — Progress Notes (Signed)
ANTICOAGULATION CONSULT NOTE   Pharmacy Consult:  Heparin Indication: atrial fibrillation  Allergies  Allergen Reactions   Ace Inhibitors Cough   Pork-Derived Products Other (See Comments)    Entry determined to be clinically insignificant: OK TO GIVE HEPARIN (has tolerated well in the past)  Patient had allergic episode in 1989 after he ate pork at a fair. He developed fever and rash and went to the ED, but he did not have breathing issues. Received heparin in the past. Patient ok with removing pork allergy and listing as intolerance instead.    Shellfish-Derived Products Rash    Per ED note 01/2021    Patient Measurements: Height: 5\' 7"  (170.2 cm) Weight: 127.8 kg (281 lb 12 oz) IBW/kg (Calculated) : 66.1 Heparin Dosing Weight: 104 kg  Vital Signs: Temp: 98.1 F (36.7 C) (12/10 0400) Temp Source: Oral (12/10 0400) BP: 116/52 (12/10 0600) Pulse Rate: 83 (12/10 0600)  Labs: Recent Labs    09/18/21 0156 09/18/21 1630 09/19/21 0622 09/19/21 1600 09/20/21 0435  HGB 10.6*  --  11.7*  --  11.0*  HCT 34.8*  --  37.7*  --  36.6*  PLT 306  --  343  --  340  HEPARINUNFRC 0.42  --  0.37  --  0.90*  CREATININE 2.99*   < > 2.70* 2.65* 2.43*   < > = values in this interval not displayed.     Estimated Creatinine Clearance: 43.1 mL/min (A) (by C-G formula based on SCr of 2.43 mg/dL (H)).   Assessment: 17 YOM presented with nausea and vomiting for 2 days and syncope.  Patient was hypotensive and bradycardic, requiring transcutaneous pacing.  S/p cath with temporary transvenous pacemaker insertion.  Pharmacy consulted to start IV heparin while Coumadin is on hold.  INR is sub-therapeutic on admit. PE noted on chest CT.  Heparin level still above goal at 0.86, CBC stable. No bleeding issues noted.   Goal of Therapy:  Heparin level 0.3-0.7 units/ml Monitor platelets by anticoagulation protocol: Yes   Plan:  Reduce IV heparin to 1600 units/hr Recheck level tonight  Erin Hearing PharmD., BCPS Clinical Pharmacist 09/20/2021 7:40 AM

## 2021-09-20 NOTE — Progress Notes (Signed)
ANTICOAGULATION CONSULT NOTE   Pharmacy Consult:  Heparin Indication: atrial fibrillation  Allergies  Allergen Reactions   Ace Inhibitors Cough   Pork-Derived Products Other (See Comments)    Entry determined to be clinically insignificant: OK TO GIVE HEPARIN (has tolerated well in the past)  Patient had allergic episode in 1989 after he ate pork at a fair. He developed fever and rash and went to the ED, but he did not have breathing issues. Received heparin in the past. Patient ok with removing pork allergy and listing as intolerance instead.    Shellfish-Derived Products Rash    Per ED note 01/2021    Patient Measurements: Height: 5\' 7"  (170.2 cm) Weight: 127.8 kg (281 lb 12 oz) IBW/kg (Calculated) : 66.1 Heparin Dosing Weight: 104 kg  Vital Signs: Temp: 97.9 F (36.6 C) (12/10 2000) Temp Source: Oral (12/10 2000) BP: 112/52 (12/10 2200) Pulse Rate: 82 (12/10 2100)  Labs: Recent Labs    09/18/21 0156 09/18/21 1630 09/19/21 0622 09/19/21 1600 09/20/21 0435 09/20/21 1135 09/20/21 1600 09/20/21 2120 09/20/21 2323  HGB 10.6*  --  11.7*  --  11.0*  --   --  11.2*  --   HCT 34.8*  --  37.7*  --  36.6*  --   --  33.0*  --   PLT 306  --  343  --  340  --   --   --   --   HEPARINUNFRC 0.42  --  0.37  --  0.90* 0.86*  --   --  0.64  CREATININE 2.99*   < > 2.70* 2.65* 2.43*  --  2.26*  --   --    < > = values in this interval not displayed.     Estimated Creatinine Clearance: 46.3 mL/min (A) (by C-G formula based on SCr of 2.26 mg/dL (H)).   Assessment: 89 YOM presented with nausea and vomiting for 2 days and syncope.  Patient was hypotensive and bradycardic, requiring transcutaneous pacing.  S/p cath with temporary transvenous pacemaker insertion.  Pharmacy consulted to start IV heparin while Coumadin is on hold.  INR is sub-therapeutic on admit. PE noted on chest CT.  Heparin level at goal on 1600 units/hr  Goal of Therapy:  Heparin level 0.3-0.7 units/ml Monitor  platelets by anticoagulation protocol: Yes   Plan:  -Continue heparin at 1600 units/h -Daily heparin level and CBC  Hildred Laser, PharmD Clinical Pharmacist **Pharmacist phone directory can now be found on [email or web address] ([email or web address]).  Listed under [place].

## 2021-09-20 NOTE — Progress Notes (Signed)
ANTICOAGULATION CONSULT NOTE - Follow Up Consult  Pharmacy Consult for heparin Indication: atrial fibrillation and pulmonary embolus  Labs: Recent Labs    09/18/21 0156 09/18/21 1630 09/19/21 0622 09/19/21 1600 09/20/21 0435  HGB 10.6*  --  11.7*  --  11.0*  HCT 34.8*  --  37.7*  --  36.6*  PLT 306  --  343  --  340  HEPARINUNFRC 0.42  --  0.37  --  0.90*  CREATININE 2.99*   < > 2.70* 2.65* 2.43*   < > = values in this interval not displayed.    Assessment: 55yo male supratherapeutic on heparin after several levels at goal; no infusion issues or signs of bleeding per RN.  Goal of Therapy:  Heparin level 0.3-0.7 units/ml   Plan:  Will decrease heparin infusion by 2 units/kg/hr to 1900 units/hr and check level in 8 hours.    Wynona Neat, PharmD, BCPS  09/20/2021,6:01 AM

## 2021-09-20 NOTE — Progress Notes (Signed)
Progress Note  Patient Name: [name] [name] Date of Encounter: 09/20/2021  Primary Cardiologist:   None   Subjective   Intubated and sedated.    Inpatient Medications    Scheduled Meds:  aspirin  81 mg Per Tube Daily   atorvastatin  80 mg Per Tube Daily   B-complex with vitamin C  1 tablet Per Tube Daily   chlorhexidine gluconate (MEDLINE KIT)  15 mL Mouth Rinse BID   Chlorhexidine Gluconate Cloth  6 each Topical Q0600   clonazePAM  0.5 mg Per Tube BID   docusate  100 mg Per Tube BID   feeding supplement (PROSource TF)  90 mL Per Tube TID   insulin aspart  0-9 Units Subcutaneous Q4H   insulin aspart  3 Units Subcutaneous Q4H   lactulose  10 g Per Tube BID   mouth rinse  15 mL Mouth Rinse 10 times per day   midodrine  15 mg Per Tube TID WC   ondansetron (ZOFRAN) IV  4 mg Intravenous Once   pantoprazole sodium  40 mg Per Tube Daily   polyethylene glycol  17 g Per Tube Daily   rOPINIRole  0.25 mg Per Tube QHS   sodium chloride flush  3 mL Intravenous Q12H   ticagrelor  90 mg Per Tube BID   Continuous Infusions:   prismasol BGK 4/2.5 400 mL/hr at 09/19/21 2259    prismasol BGK 4/2.5 300 mL/hr at 09/20/21 0307   sodium chloride Stopped (09/13/21 1550)   sodium chloride     sodium chloride 10 mL/hr at 09/20/21 0800   sodium chloride 10 mL/hr at 09/19/21 0200   amiodarone 30 mg/hr (09/20/21 0824)   calcium gluconate 1,000 mg (09/20/21 0936)   ceFEPime (MAXIPIME) IV 2 g (09/20/21 0816)   dexmedetomidine (PRECEDEX) IV infusion 1.2 mcg/kg/hr (09/20/21 0800)   DOPamine 7 mcg/kg/min (09/20/21 0800)   feeding supplement (VITAL AF 1.2 CAL) 50 mL/hr at 09/20/21 0400   fentaNYL infusion INTRAVENOUS     heparin 1,900 Units/hr (09/20/21 0827)   norepinephrine (LEVOPHED) Adult infusion 30 mcg/min (09/20/21 0922)   prismasol BGK 4/2.5 2,000 mL/hr at 09/20/21 0135   PRN Meds: sodium chloride, sodium chloride, sodium chloride, sodium chloride, acetaminophen, alteplase, bisacodyl,  docusate, fentaNYL, fentaNYL (SUBLIMAZE) injection, fentaNYL (SUBLIMAZE) injection, heparin, lidocaine (PF), lidocaine-prilocaine, ondansetron (ZOFRAN) IV, pentafluoroprop-tetrafluoroeth, polyethylene glycol, sodium chloride flush, sodium chloride flush   Vital Signs    Vitals:   09/20/21 0600 09/20/21 0800 09/20/21 0801 09/20/21 0802  BP: (!) 116/52 (!) 96/46 (!) 96/46   Pulse: 83 84 84   Resp:  (!) 26 (!) 26   Temp:  98.2 F (36.8 C)    TempSrc:  Oral    SpO2: 98% 96% 94% 95%  Weight:      Height:        Intake/Output Summary (Last 24 hours) at 09/20/2021 0936 Last data filed at 09/20/2021 0800 Gross per 24 hour  Intake 4666.71 ml  Output 5625 ml  Net -958.29 ml   Filed Weights   09/18/21 0400 09/19/21 0500 09/20/21 0500  Weight: 129.8 kg 130.3 kg 127.8 kg    Telemetry    NSR with PVCs - Personally Reviewed  ECG    NA - Personally Reviewed  Physical Exam   GEN: Critically ill appearing Neck: No  JVD Cardiac: RRR, no murmurs, rubs, or gallops.  Respiratory: Clear  to auscultation bilaterally. GI: Soft, nontender, non-distended  MS: No  edema; No deformity. Neuro:  Intubated and  sedated    Labs    Chemistry Recent Labs  Lab 09/17/21 0038 09/17/21 0106 09/19/21 0622 09/19/21 1600 09/20/21 0435  NA 132*  132*   < > 136 134* 134*  K 4.1  4.1   < > 4.2 4.4 4.2  CL 99  98   < > 101 102 101  CO2 24  24   < > _0 GLUCOSE 57*  57*   < > 180* 224* 291*  BUN 31*  31*   < > _1 CREATININE 5.55*  5.46*   < > 2.70* 2.65* 2.43*  CALCIUM 8.3*  8.3*   < > 8.2* 8.2* 7.9*  PROT 6.8  --   --   --   --   ALBUMIN 2.7*  2.7*   < > 2.3* 2.3* 2.1*  AST 49*  --   --   --   --   ALT 35  --   --   --   --   ALKPHOS 57  --   --   --   --   BILITOT 1.8*  --   --   --   --   GFRNONAA 11*  11*   < > 27* 27* 30*  ANIONGAP 9  10   < > _2 < > = values in this interval not displayed.     Hematology Recent Labs  Lab 09/18/21 0156 09/19/21 0622  09/20/21 0435  WBC 9.0 11.9* 11.6*  RBC 3.57* 3.88* 3.73*  HGB 10.6* 11.7* 11.0*  HCT 34.8* 37.7* 36.6*  MCV 97.5 97.2 98.1  MCH 29.7 30.2 29.5  MCHC 30.5 31.0 30.1  RDW 23.1* 23.1* 23.1*  PLT 306 343 340    Cardiac EnzymesNo results for input(s): TROPONINI in the last 168 hours. No results for input(s): TROPIPOC in the last 168 hours.   BNPNo results for input(s): BNP, PROBNP in the last 168 hours.   DDimer No results for input(s): DDIMER in the last 168 hours.   Radiology    DG Chest Port 1 View  Result Date: 09/19/2021 CLINICAL DATA:  Difficulty breathing EXAM: PORTABLE CHEST 1 VIEW COMPARISON:  Previous studies including the examination of 09/16/2021 FINDINGS: Transverse diameter of heart is increased. Tip endotracheal tube is 5.2 cm above the carina. Enteric tube is noted traversing the esophagus. There is interval decrease in pulmonary vascular congestion. There is blunting of left lateral CP angle. No new focal infiltrates are seen. There is no pneumothorax. IMPRESSION: Cardiomegaly. There is interval decrease in pulmonary vascular congestion and pulmonary edema. There are no new focal infiltrates. Small left pleural effusion. Electronically Signed   By: Elmer Picker M.D.   On: 09/19/2021 09:10    Cardiac Studies   ECHO:    1. Technically difficult study despite use of definity contrast due to  off axis views and poor visualization of all endocardial segments. Left  ventricular ejection fraction, by estimation, is 50 to 55%. The left  ventricle has low normal function. Left  ventricular endocardial border not optimally defined to evaluate regional  wall motion. The left ventricular internal cavity size was mildly dilated.  There is mild asymmetric left ventricular hypertrophy of the basal-septal  segment. Left ventricular  diastolic parameters are consistent with Grade III diastolic dysfunction  (restrictive).   2. Right ventricular systolic function is normal.  The right ventricular  size is normal.   3. Left atrial size was moderately dilated.  4. Right atrial size was mild to moderately dilated.   5. The mitral valve is degenerative. Mild mitral valve regurgitation.  Moderate to severe mitral annular calcification.   6. The aortic valve is tricuspid. There is moderate calcification of the  aortic valve. There is moderate thickening of the aortic valve. Aortic  valve regurgitation is mild. Mild aortic valve stenosis.   7. The inferior vena cava is dilated in size with <50% respiratory  variability, suggesting right atrial pressure of 15 mmHg.   8. A temporary pacing wire is visualized in the IVC and RV.   Patient Profile     55 y.o. male with a history of ESRD, untreated OSA with PH, chronic hyperkalemia who presented with complete heart block that resolved with treatment of hyperkalemia. After TVP removal he suffered an asystolic cardiac arrest after his PTA amiodarone dose. He subsequently suffered a VT arrest the following night. He remains intubated on CRRT with TVP in place.  Assessment & Plan    Shock - septic +/- cardiogenic:  EF 50 - 55%.   Continue supportive care with vasopressors.  Unable to follow CVP or co-ox.    NSTEMI with known hx CAD:  Medical management of probable demand ischemia.  Continue supportive care.     CHB:  If able to wean off pressors, extubate and has stable HD access may consider Micra Leadless pacemaker. Currently not requiring pacing.    VT/PVCs:    On amiodarone.     Atrial fibrillation/flutter:  NSR.  Continue IV amiodarone.   ESRD/hyperkalemia:  CRRT per nephrology.     Acute on chronic respiratory failure with hypoxia and hypercapnia:   Vent per CCM. Being managed for pneumonia.    GOC:  Palliative Care consulted.  Family is coming from out of the country.    For questions or updates, please contact [place] Please consult [email or web address] for contact info under Cardiology/STEMI.    Signed, Minus Breeding, MD  09/20/2021, 9:36 AM

## 2021-09-20 NOTE — Progress Notes (Signed)
Patient no bowel movement for >72hrs. Attempted tap water enema patient not tolerating during turning vitals are very labile Art BP dropped 93'J systolic and CRRT access blood flow stopped. Tap water enema abandoned.

## 2021-09-21 ENCOUNTER — Inpatient Hospital Stay (HOSPITAL_COMMUNITY): Payer: Medicare HMO

## 2021-09-21 DIAGNOSIS — K567 Ileus, unspecified: Secondary | ICD-10-CM

## 2021-09-21 DIAGNOSIS — N186 End stage renal disease: Secondary | ICD-10-CM | POA: Diagnosis not present

## 2021-09-21 DIAGNOSIS — R001 Bradycardia, unspecified: Secondary | ICD-10-CM | POA: Diagnosis not present

## 2021-09-21 DIAGNOSIS — I442 Atrioventricular block, complete: Secondary | ICD-10-CM | POA: Diagnosis not present

## 2021-09-21 DIAGNOSIS — I469 Cardiac arrest, cause unspecified: Secondary | ICD-10-CM | POA: Diagnosis not present

## 2021-09-21 DIAGNOSIS — R570 Cardiogenic shock: Secondary | ICD-10-CM | POA: Diagnosis not present

## 2021-09-21 LAB — COMPREHENSIVE METABOLIC PANEL
ALT: 54 U/L — ABNORMAL HIGH (ref 0–44)
AST: 80 U/L — ABNORMAL HIGH (ref 15–41)
Albumin: 2 g/dL — ABNORMAL LOW (ref 3.5–5.0)
Alkaline Phosphatase: 92 U/L (ref 38–126)
Anion gap: 9 (ref 5–15)
BUN: 23 mg/dL — ABNORMAL HIGH (ref 6–20)
CO2: 25 mmol/L (ref 22–32)
Calcium: 8.3 mg/dL — ABNORMAL LOW (ref 8.9–10.3)
Chloride: 102 mmol/L (ref 98–111)
Creatinine, Ser: 1.91 mg/dL — ABNORMAL HIGH (ref 0.61–1.24)
GFR, Estimated: 40 mL/min — ABNORMAL LOW (ref >60)
Glucose, Bld: 278 mg/dL — ABNORMAL HIGH (ref 70–99)
Potassium: 4.9 mmol/L (ref 3.5–5.1)
Sodium: 136 mmol/L (ref 135–145)
Total Bilirubin: 3.7 mg/dL — ABNORMAL HIGH (ref 0.3–1.2)
Total Protein: 7.1 g/dL (ref 6.5–8.1)

## 2021-09-21 LAB — POCT I-STAT 7, (LYTES, BLD GAS, ICA,H+H)
Acid-base deficit: 4 mmol/L — ABNORMAL HIGH (ref 0.0–2.0)
Bicarbonate: 22.5 mmol/L (ref 20.0–28.0)
Calcium, Ion: 1.12 mmol/L — ABNORMAL LOW (ref 1.15–1.40)
HCT: 29 % — ABNORMAL LOW (ref 39.0–52.0)
Hemoglobin: 9.9 g/dL — ABNORMAL LOW (ref 13.0–17.0)
O2 Saturation: 100 %
Patient temperature: 96.8
Potassium: 4.4 mmol/L (ref 3.5–5.1)
Sodium: 137 mmol/L (ref 135–145)
TCO2: 24 mmol/L (ref 22–32)
pCO2 arterial: 44.7 mmHg (ref 32.0–48.0)
pH, Arterial: 7.305 — ABNORMAL LOW (ref 7.350–7.450)
pO2, Arterial: 286 mmHg — ABNORMAL HIGH (ref 83.0–108.0)

## 2021-09-21 LAB — RENAL FUNCTION PANEL
Albumin: 1.9 g/dL — ABNORMAL LOW (ref 3.5–5.0)
Anion gap: 10 (ref 5–15)
BUN: 29 mg/dL — ABNORMAL HIGH (ref 6–20)
CO2: 22 mmol/L (ref 22–32)
Calcium: 7.8 mg/dL — ABNORMAL LOW (ref 8.9–10.3)
Chloride: 102 mmol/L (ref 98–111)
Creatinine, Ser: 2.33 mg/dL — ABNORMAL HIGH (ref 0.61–1.24)
GFR, Estimated: 32 mL/min — ABNORMAL LOW (ref >60)
Glucose, Bld: 282 mg/dL — ABNORMAL HIGH (ref 70–99)
Phosphorus: 2.7 mg/dL (ref 2.5–4.6)
Potassium: 4.2 mmol/L (ref 3.5–5.1)
Sodium: 134 mmol/L — ABNORMAL LOW (ref 135–145)

## 2021-09-21 LAB — CBC
HCT: 33.7 % — ABNORMAL LOW (ref 39.0–52.0)
Hemoglobin: 10.3 g/dL — ABNORMAL LOW (ref 13.0–17.0)
MCH: 29.3 pg (ref 26.0–34.0)
MCHC: 30.6 g/dL (ref 30.0–36.0)
MCV: 95.7 fL (ref 80.0–100.0)
Platelets: 329 10*3/uL (ref 150–400)
RBC: 3.52 MIL/uL — ABNORMAL LOW (ref 4.22–5.81)
RDW: 23.2 % — ABNORMAL HIGH (ref 11.5–15.5)
WBC: 11.2 10*3/uL — ABNORMAL HIGH (ref 4.0–10.5)
nRBC: 0.2 % (ref 0.0–0.2)

## 2021-09-21 LAB — HEPARIN LEVEL (UNFRACTIONATED)
Heparin Unfractionated: 0.95 IU/mL — ABNORMAL HIGH (ref 0.30–0.70)
Heparin Unfractionated: 0.88 IU/mL — ABNORMAL HIGH (ref 0.30–0.70)

## 2021-09-21 LAB — GLUCOSE, CAPILLARY
Glucose-Capillary: 247 mg/dL — ABNORMAL HIGH (ref 70–99)
Glucose-Capillary: 247 mg/dL — ABNORMAL HIGH (ref 70–99)
Glucose-Capillary: 247 mg/dL — ABNORMAL HIGH (ref 70–99)
Glucose-Capillary: 292 mg/dL — ABNORMAL HIGH (ref 70–99)
Glucose-Capillary: 283 mg/dL — ABNORMAL HIGH (ref 70–99)

## 2021-09-21 LAB — MAGNESIUM: Magnesium: 2.3 mg/dL (ref 1.7–2.4)

## 2021-09-21 LAB — LACTIC ACID, PLASMA: Lactic Acid, Venous: 3.2 mmol/L (ref 0.5–1.9)

## 2021-09-21 MED ORDER — MIDAZOLAM HCL 2 MG/2ML IJ SOLN
2.0000 mg | INTRAMUSCULAR | Status: DC | PRN
  Administered 2021-09-22 (×4): 2 mg via INTRAVENOUS
  Filled 2021-09-21 (×6): qty 2

## 2021-09-21 MED ORDER — ALBUTEROL SULFATE (2.5 MG/3ML) 0.083% IN NEBU: 2.5000 mg | INHALATION_SOLUTION | RESPIRATORY_TRACT | Status: DC | PRN

## 2021-09-21 MED ORDER — METOCLOPRAMIDE HCL 5 MG/ML IJ SOLN
5.0000 mg | Freq: Two times a day (BID) | INTRAMUSCULAR | Status: DC
  Administered 2021-09-21 – 2021-09-22 (×2): 5 mg via INTRAVENOUS
  Filled 2021-09-21 (×2): qty 2

## 2021-09-21 MED ORDER — MIDAZOLAM HCL 2 MG/2ML IJ SOLN
INTRAMUSCULAR | Status: AC
  Filled 2021-09-21: qty 2

## 2021-09-21 MED ORDER — VASOPRESSIN 20 UNITS/100 ML INFUSION FOR SHOCK
0.0000 [IU]/min | INTRAVENOUS | Status: DC
  Administered 2021-09-21: 0.04 [IU]/min via INTRAVENOUS
  Administered 2021-09-21: 0.03 [IU]/min via INTRAVENOUS
  Administered 2021-09-22 (×2): 0.04 [IU]/min via INTRAVENOUS
  Filled 2021-09-21 (×6): qty 100

## 2021-09-21 MED ORDER — MAGNESIUM SULFATE 4 GM/100ML IV SOLN
INTRAVENOUS | Status: AC
  Administered 2021-09-21: 4 g via INTRAVENOUS
  Filled 2021-09-21: qty 100

## 2021-09-21 MED ORDER — MAGNESIUM SULFATE 4 GM/100ML IV SOLN: 4.0000 g | Freq: Once | INTRAVENOUS | Status: AC

## 2021-09-21 MED ORDER — MIDAZOLAM HCL 2 MG/2ML IJ SOLN
2.0000 mg | INTRAMUSCULAR | Status: AC | PRN
  Administered 2021-09-21 – 2021-09-22 (×3): 2 mg via INTRAVENOUS

## 2021-09-21 MED ORDER — POLYETHYLENE GLYCOL 3350 17 G PO PACK: 17.0000 g | PACK | Freq: Every day | ORAL | Status: DC

## 2021-09-21 MED ORDER — DOCUSATE SODIUM 50 MG/5ML PO LIQD: 100.0000 mg | Freq: Two times a day (BID) | ORAL | Status: DC

## 2021-09-21 MED ORDER — EPINEPHRINE HCL 5 MG/250ML IV SOLN IN NS
0.5000 ug/min | INTRAVENOUS | Status: DC
  Administered 2021-09-21: 2 ug/min via INTRAVENOUS
  Administered 2021-09-22: 22 ug/min via INTRAVENOUS
  Administered 2021-09-22 (×3): 25 ug/min via INTRAVENOUS
  Administered 2021-09-22 (×2): 22 ug/min via INTRAVENOUS
  Filled 2021-09-21 (×8): qty 250

## 2021-09-21 MED ORDER — POLYETHYLENE GLYCOL 3350 17 G PO PACK: 17.0000 g | PACK | Freq: Two times a day (BID) | ORAL | Status: DC

## 2021-09-21 MED ORDER — DEXMEDETOMIDINE HCL IN NACL 400 MCG/100ML IV SOLN
0.0000 ug/kg/h | INTRAVENOUS | Status: DC
  Administered 2021-09-21: 1.2 ug/kg/h via INTRAVENOUS

## 2021-09-21 MED ORDER — SORBITOL 70 % SOLN
60.0000 mL | Freq: Once | Status: AC
  Administered 2021-09-21: 60 mL
  Filled 2021-09-21: qty 60

## 2021-09-21 MED ORDER — AMIODARONE LOAD VIA INFUSION
150.0000 mg | Freq: Once | INTRAVENOUS | Status: AC
  Administered 2021-09-21: 150 mg via INTRAVENOUS
  Filled 2021-09-21: qty 83.34

## 2021-09-21 NOTE — Significant Event (Signed)
Code Blue Documentation  Called to bedside for sudden onset hypotension. Nurse had already increased norepinephrine.  I dropped down his PEEP from 12 to 10. Bps initially responsive to higher doses of vasopressor. Repeat labs sent - lactic acid and CMP. About 20 minutes later patient had repeat sudden onset hypotension resulting in PEA arrest. Had one round of ACLS with 1 mg epi, 1 amp bicarb and 1g of calcium. ROSC obtained. Repeat CMP and lactic acid still pending. Family friend was called - patient's brother is still en route due to flight delays. But he is coming up to bedside to see the patient.     Lenice Llamas, MD Pulmonary and [place] 09/21/2021 2:59 PM Pager: see AMION  If no response to pager, please call critical care on call (see AMION) until 7pm After 7:00 pm call Elink

## 2021-09-21 NOTE — Progress Notes (Signed)
1433:  Pt BP steadily declining.  Levophed up to 41mcg, Dopamine 72mcg.  Mujahid Ashqer -pt's contact notified of events.  Pts friend states brother's flight was delayed and will arrive tonight in [place].  No ETA known for when brother will arrive at hospital.     1530:  Dr Lorraine Lax at bedside, amiodarone bolus 150mg  given for sustained VT.  Pt vomited.  OGT connected to suction.    1555:  1/2 amp epi given, Magnesium 4mg  IVPB a5 46ml/hr started as ordered by Dr Lorraine Lax.

## 2021-09-21 NOTE — Progress Notes (Signed)
Patient persistently unstable arrhythmias. Having sustained VT without losing a pulse. Frequent ectopic beats. Now requiring vasopressin. I did order 4 mg empirically while awaiting CMP. Lactic acid reviewed and mildly elevated. I discussed his care with family friend Mr. Melba Coon over the phone. Conveyed high likehood for repeat cardiac arrest and concern he may not survive to see his brother who is on the way. He said he appreciated everything we are doing and understands the seriousness of the situation.    Additional CC time 35 minutes irrespective of procedures.   Lenice Llamas, MD Pulmonary and [place] 09/21/2021 4:01 PM Pager: see AMION  If no response to pager, please call critical care on call (see AMION) until 7pm After 7:00 pm call Elink

## 2021-09-21 NOTE — Progress Notes (Signed)
   09/21/21 0839  Airway 7.5 mm  Placement Date/Time: 10/02/2021 (c) 2305   Grade View: Grade 1  Airway Device: Endotracheal Tube  Laryngoscope Blade: 3  ETT Types: Oral  Size (mm): 7.5 mm  Cuffed: Cuffed;Min.occ.pres.  Insertion attempts: 1  Airway Equipment: Stylet;Video Laryngoscope...  Secured at (cm) 23 cm  Measured From Lips  Secured Location Left  Secured By Actuary Repositioned Yes  Prone position No  Cuff Pressure (cm H2O) Clear OR 27-39 CmH2O  Site Condition Dry  Adult Ventilator Settings  Vent Type Servo i  Humidity HME  Vent Mode PRVC  Vt Set 540 mL  Set Rate 24 bmp  FiO2 (%) 60 %  I Time 0.7 Sec(s)  PEEP 12 cmH20  Adult Ventilator Measurements  Peak Airway Pressure 34 L/min  Mean Airway Pressure 18 cmH20  Plateau Pressure 8 cmH20  Resp Rate Spontaneous 2 br/min  Resp Rate Total 26 br/min  Exhaled Vt 484 mL  Measured Ve 14.9 mL  I:E Ratio Measured 1:1.3  Auto PEEP 0 cmH20  Total PEEP 12 cmH20  SpO2 100 %  Adult Ventilator Alarms  Alarms On Y  Ve High Alarm 20 L/min  Ve Low Alarm 8 L/min  Resp Rate High Alarm 38 br/min  Resp Rate Low Alarm 18  PEEP Low Alarm 10 cmH2O  Press High Alarm 45 cmH2O  Daily Weaning Assessment  Daily Assessment of Readiness to Wean Wean protocol criteria not met  Reason not met Fi02 > 40%;PEEP > 8  Breath Sounds  Bilateral Breath Sounds Diminished  Airway Suctioning/Secretions  Suction Type ETT  Suction Device  Catheter  Secretion Amount Small  Secretion Color White  Secretion Consistency Thick  Suction Tolerance Tolerated well  Suctioning Adverse Effects None

## 2021-09-21 NOTE — Progress Notes (Signed)
   09/21/21 1459  Clinical Encounter Type  Visited With Patient;Health care provider  Visit Type Initial;Code   Chaplain responded to a code blue. CPR was successful. No family is present at this time. No needs at this time. Spiritual care services available as needed.   Jeri Lager, Chaplain

## 2021-09-21 NOTE — Code Documentation (Signed)
  Patient Name: [name] [name]   MRN: [id number]   Date of Birth/ Sex: [birth year] , male      Admission Date: 10/11/2021  Attending Provider: Spero Geralds, MD  Primary Diagnosis: Bradycardia    Indication: Pt was in his usual state of health until this PM, when he was noted to be hypotensive. Code blue was subsequently called. At the time of arrival on scene, ACLS protocol was underway.   Technical Description:  - CPR performance duration:  1 minute  - Was defibrillation or cardioversion used? No   - Was external pacer placed? No  - Was patient intubated pre/post CPR? Yes   Medications Administered: Y = Yes; Blank = No Amiodarone    Atropine    Calcium    Epinephrine  Y  Lidocaine    Magnesium    Norepinephrine    Phenylephrine    Sodium bicarbonate  Y  Vasopressin     Post CPR evaluation:  - Final Status - Was patient successfully resuscitated ? Yes - What is current rhythm? afib - What is current hemodynamic status? stable  Miscellaneous Information:  - Labs sent, including: N/a  - Primary team notified?  Yes  - Family Notified? no  - Additional notes/ transfer status: Family aware and travelling from out of country     [place], Rachell Cipro, MD  09/21/2021, 3:24 PM

## 2021-09-21 NOTE — Progress Notes (Signed)
NAME:  Laura Radilla, MRN:  [id number], DOB:  [birth year], LOS: 9 ADMISSION DATE:  10/02/2021, CONSULTATION DATE:  09/29/2021 REFERRING MD:  Kathrynn Humble CHIEF COMPLAINT: Syncope   History of Present Illness:  55 year old man who presented to [facility] 12/2 via EMS with nausea and vomiting, R sided CP and syncope on day of admission. PMHx significant for ESRD (on HD TTS via LUE AVF) c/b chronic hyperkalemia, advanced CHF, pulmonary hypertension, Afib, DVT (on warfarin).  On EMS evaluation, patient was bradycardic to 30s-40s prompting initiation of transcutaneous pacing (started en route). On ED arrival, arterial line was placed. Labs were notable for trop 4k, K 6.6 (iStat). Ca gluconate and 2 amps bicarb were administered. Levo was initiated. Patient remained dependent on transcutaneous pacing. Cardiology/EP and Nephrology were consulted. Patient was taken to cath lab 12/2 for TVP placement. Temp wires removed 12/5, however patient had recurrence of bradycardia/progressive pauses necessitating repeat TVP placement 12/5PM.  Pertinent Medical History:  ESRD DVT Chronic systolic and diastolic heart failure Multifactorial pulmonary hypertension CAD OSA (not using CPAP)  Significant Hospital Events: Including procedures, antibiotic start and stop dates in addition to other pertinent events   10/04/2021 arterial line placed, transcutaneous pacing, transvenous pacemaker placed in cath lab, Started on iHD 12/3 HD per TTS schedule 12/5 Temp wire pulled, AV conduction improved. L groin sheath removed. Bradycardic with progressive pauses later in the evening prompting replacement of TVP. Intubated for procedure with escalation of pressor/hemodynamic support. 12/6 PCM consulted.  12/7 VT overnight. Required Cardioversion.  Family conversation, brother coming from [place] to participate in goals of care remaining full code until brother can be present at bedside. Cefepime started for PNA. EP and advanced HF consulted.  IF he can be extubated and get off pressors leadless PM to be considered.  12/8 intermittently dropping blood pressure requiring escalating norepinephrine and dopamine. Precedex started  12/9 cxr improved. Pct still elevated. Resp Cultures w/ many GNR (still pending) . Dc fent.gtt starting PRN 12/10 aggressive bowel regimen attempted, unable to tolerate enema 12/11 cardiac arrest x 1 round CPR, ROSC achieved, worsening shock.  Interim History / Subjective:  Still no improvement in hemodynamics Still no bowel movement Had cardiac arrest this afternoon.  Now with worsening shock. Already on dopamine norepinephrine. Vasopressin added  Objective:  Blood pressure (!) 78/48, pulse 80, temperature 97.7 F (36.5 C), resp. rate (!) 26, height 5\' 7"  (1.702 m), weight 129.2 kg, SpO2 99 %.    Vent Mode: PRVC FiO2 (%):  [50 %-100 %] 50 % Set Rate:  [24 bmp] 24 bmp Vt Set:  [540 mL] 540 mL PEEP:  [8 HFW26-37 cmH20] 12 cmH20 Plateau Pressure:  [8 cmH20-29 cmH20] 19 cmH20   Intake/Output Summary (Last 24 hours) at 09/21/2021 1506 Last data filed at 09/21/2021 1400 Gross per 24 hour  Intake 3906.48 ml  Output 5542 ml  Net -1635.52 ml   Filed Weights   09/19/21 0500 09/20/21 0500 09/21/21 0600  Weight: 130.3 kg 127.8 kg 129.2 kg   Physical Examination:  Gen:      Intubated, sedated, acutely ill appearing, morbidly obese HEENT:  ETT to vent Lungs:    sounds of mechanical ventilation auscultated. Diminished bilaterally, no wheeze CV:         HR 80s, paced.  Abd:      Distended, soft, hypoactive bowel sounds Ext:    No edema Skin:      Warm and dry; jaundiced Neuro:   sedated not responsive    Resolved Hospital  Problem List:  Toxic metabolic encephalopathy due to under-dialysis and hypotension Hyperkalemia  Assessment & Plan:   Cardiogenic shock due to complete heart block with slow escape requiring TVPM and titration of NE NSTEMI  Chronic systolic and diastolic heart failure due to  ICM Grade 3 diastolic dysfunction CAD Plan Cont NE and dopamine. BP goal to SBP > 100. Adding vasopressin after arrest this afternoon.  Keep TVP in place.  Cont Brilinta and atorvastatin  Not candidate for GDMT given hypotension  IV heparin  Stopping volume removal for dialysis due to progressive shock.   CAP PCT still elevated Plan Cefepime day 5 today, sputum culture shows diptheroids. Finish 5 days.   Persistent atrial fibrillation  Sustained VT (12/7)  DVT RUL segmental PE 12/3 PEA Arrest 12/11 afternoon ROSC achieved after 1 round CPR. Unclear etiology.  Plan Continue Amiodarone Continue IV heparin   Acute-on-chronic hypoxemic respiratory failure requiring mechanical ventilation OSA, untreated Likely OHS Patient has OSA, but does not utilize CPAP as an outpatient. Likely some degree of OHS.   Plan Maintain PEEP at 10 Not appropriate for extubation due to condition due to progressive shock and encephalopathy   ESRD with chronic, dialysis-dependent Chronic hypotension Complication of LUE AVF - Nephrology following, appreciate assistance Plan Continue CRRT with volume removal as tolerated If he were to survive would need fistulagram/declotting of fistula vs catheter Serial chems continue midodrine at 15 tid   DM type 2  - CBG 140-180, at goal  Plan Cont ssi 140-180 Cont current tubefeed coverage   Inadequate oral intake.  Ileus H/o MO Holding tube feeds due to aspiration, ileus Aggressive bowel regimen ordered. Added sorbitol today He needs stimulation from below but hasn't been able to tolerate enema or retain.  Will try reglan as a prokinetic   Best Practice: (right click and "Reselect all SmartList Selections" daily)   Diet/type: tubefeeds DVT prophylaxis: systemic heparin GI prophylaxis: PPI Lines: Central line Foley:  N/A Code Status: full code Last date of multidisciplinary goals of care discussion [brother still in transit to the [place] from  [place]. Family friend coming to bedside, will update in person when he is here.]  Critical care time: 47  minutes   The patient is critically ill due to cardiogenic shock, respiratory failure, ESRD.  Critical care was necessary to treat or prevent imminent or life-threatening deterioration.  Critical care was time spent personally by me on the following activities: development of treatment plan with patient and/or surrogate as well as nursing, discussions with consultants, evaluation of patient's response to treatment, examination of patient, obtaining history from patient or surrogate, ordering and performing treatments and interventions, ordering and review of laboratory studies, ordering and review of radiographic studies, pulse oximetry, re-evaluation of patient's condition and participation in multidisciplinary rounds.   Critical Care Time devoted to patient care services described in this note is 45 minutes. This time reflects time of care of this [place] . This critical care time does not reflect separately billable procedures or procedure time, teaching time or supervisory time of PA/NP/Med student/Med Resident etc but could involve care discussion time.       See Separate CPR documentation. My CC time not inclusive of CPR.   Spero Geralds [facility] 09/21/2021 3:06 PM  Pager: see AMION  If no response to pager , please call critical care on call (see AMION) until 7pm After 7:00 pm call Elink

## 2021-09-21 NOTE — Progress Notes (Addendum)
eLink Physician-Brief Progress Note Patient Name: [name] [name] DOB: 12-12-1965 MRN: [id number]   Date of Service  09/21/2021  HPI/Events of Note  Agitated since Precedex was held for hypotension. Remains on multiple pressors.  Brother reported to be midflight to [place]  eICU Interventions  Increased max Fentanyl 332mcg/h.  10:10PM Added PRN versed Increased max levophed 153mcg/min and epi 10mcg/min     Intervention Category Major Interventions: Hypotension - evaluation and management  Hilberto Burzynski Rodman Pickle 09/21/2021, 8:47 PM

## 2021-09-21 NOTE — Progress Notes (Signed)
 Progress Note  Patient Name: [name] [name] Date of Encounter: 09/21/2021  Primary Cardiologist:   None   Subjective   Intubated and sedated.    Inpatient Medications    Scheduled Meds:  aspirin  81 mg Per Tube Daily   atorvastatin  80 mg Per Tube Daily   B-complex with vitamin C  1 tablet Per Tube Daily   chlorhexidine gluconate (MEDLINE KIT)  15 mL Mouth Rinse BID   Chlorhexidine Gluconate Cloth  6 each Topical Q0600   clonazePAM  0.5 mg Per Tube BID   docusate  100 mg Per Tube BID   feeding supplement (PROSource TF)  90 mL Per Tube TID   insulin aspart  0-9 Units Subcutaneous Q4H   insulin aspart  3 Units Subcutaneous Q4H   lactulose  10 g Per Tube BID   mouth rinse  15 mL Mouth Rinse 10 times per day   midodrine  15 mg Per Tube TID WC   ondansetron (ZOFRAN) IV  4 mg Intravenous Once   pantoprazole sodium  40 mg Per Tube Daily   polyethylene glycol  17 g Per Tube Daily   rOPINIRole  0.25 mg Per Tube QHS   sodium chloride flush  3 mL Intravenous Q12H   ticagrelor  90 mg Per Tube BID   Continuous Infusions:   prismasol BGK 4/2.5 400 mL/hr at 09/20/21 2018    prismasol BGK 4/2.5 300 mL/hr at 09/21/21 0045   sodium chloride Stopped (09/13/21 1550)   sodium chloride     sodium chloride 10 mL/hr at 09/21/21 0600   sodium chloride 10 mL/hr at 09/19/21 0200   amiodarone 30 mg/hr (09/21/21 0725)   ceFEPime (MAXIPIME) IV 2 g (09/21/21 0817)   dexmedetomidine (PRECEDEX) IV infusion 1.2 mcg/kg/hr (09/21/21 0814)   DOPamine 10 mcg/kg/min (09/21/21 0600)   feeding supplement (VITAL AF 1.2 CAL) Stopped (09/21/21 0053)   fentaNYL infusion INTRAVENOUS 100 mcg/hr (09/21/21 0629)   heparin 1,600 Units/hr (09/21/21 0600)   norepinephrine (LEVOPHED) Adult infusion 38 mcg/min (09/21/21 0600)   prismasol BGK 4/2.5 2,000 mL/hr at 09/21/21 0848   PRN Meds: sodium chloride, sodium chloride, sodium chloride, sodium chloride, acetaminophen, alteplase, bisacodyl, docusate, fentaNYL,  fentaNYL (SUBLIMAZE) injection, fentaNYL (SUBLIMAZE) injection, heparin, lidocaine (PF), lidocaine-prilocaine, ondansetron (ZOFRAN) IV, pentafluoroprop-tetrafluoroeth, polyethylene glycol, sodium chloride flush, sodium chloride flush   Vital Signs    Vitals:   09/21/21 0400 09/21/21 0600 09/21/21 0743 09/21/21 0839  BP: (!) 98/56 (!) 86/49    Pulse: 95 88    Resp:      Temp:   97.7 F (36.5 C)   TempSrc:      SpO2: 100% 100%  100%  Weight:  129.2 kg    Height:        Intake/Output Summary (Last 24 hours) at 09/21/2021 0909 Last data filed at 09/21/2021 0800 Gross per 24 hour  Intake 3510.19 ml  Output 5301 ml  Net -1790.81 ml   Filed Weights   09/19/21 0500 09/20/21 0500 09/21/21 0600  Weight: 130.3 kg 127.8 kg 129.2 kg    Telemetry    NSR, no sustained bradycardia, NSVT - Personally Reviewed  ECG    NA - Personally Reviewed  Physical Exam   GEN:   Critically ill appearing.  Neck: No  JVD Cardiac: RRR, no murmurs, rubs, or gallops.  Respiratory:      Decreased breath sounds GI: Soft, nontender, distended with decreased bowel sounds MS:    Diffuse edema; No deformity. Neuro:     Unable to assess.  Psych: Intubated and sedated.     Labs    Chemistry Recent Labs  Lab 09/17/21 0038 09/17/21 0106 09/20/21 0435 09/20/21 1600 09/20/21 2120 09/21/21 0358  NA 132*  132*   < > 134* 136 138 134*  K 4.1  4.1   < > 4.2 4.6 4.2 4.2  CL 99  98   < > 101 102  --  102  CO2 24  24   < > 24 25  --  22  GLUCOSE 57*  57*   < > 291* 300*  --  282*  BUN 31*  31*   < > 19 23*  --  29*  CREATININE 5.55*  5.46*   < > 2.43* 2.26*  --  2.33*  CALCIUM 8.3*  8.3*   < > 7.9* 8.5*  --  7.8*  PROT 6.8  --   --   --   --   --   ALBUMIN 2.7*  2.7*   < > 2.1* 2.0*  --  1.9*  AST 49*  --   --   --   --   --   ALT 35  --   --   --   --   --   ALKPHOS 57  --   --   --   --   --   BILITOT 1.8*  --   --   --   --   --   GFRNONAA 11*  11*   < > 30* 33*  --  32*  ANIONGAP 9   10   < > 9 9  --  10   < > = values in this interval not displayed.     Hematology Recent Labs  Lab 09/19/21 0622 09/20/21 0435 09/20/21 2120 09/21/21 0358  WBC 11.9* 11.6*  --  11.2*  RBC 3.88* 3.73*  --  3.52*  HGB 11.7* 11.0* 11.2* 10.3*  HCT 37.7* 36.6* 33.0* 33.7*  MCV 97.2 98.1  --  95.7  MCH 30.2 29.5  --  29.3  MCHC 31.0 30.1  --  30.6  RDW 23.1* 23.1*  --  23.2*  PLT 343 340  --  329    Cardiac EnzymesNo results for input(s): TROPONINI in the last 168 hours. No results for input(s): TROPIPOC in the last 168 hours.   BNPNo results for input(s): BNP, PROBNP in the last 168 hours.   DDimer No results for input(s): DDIMER in the last 168 hours.   Radiology    No results found.  Cardiac Studies   ECHO:    1. Technically difficult study despite use of definity contrast due to  off axis views and poor visualization of all endocardial segments. Left  ventricular ejection fraction, by estimation, is 50 to 55%. The left  ventricle has low normal function. Left  ventricular endocardial border not optimally defined to evaluate regional  wall motion. The left ventricular internal cavity size was mildly dilated.  There is mild asymmetric left ventricular hypertrophy of the basal-septal  segment. Left ventricular  diastolic parameters are consistent with Grade III diastolic dysfunction  (restrictive).   2. Right ventricular systolic function is normal. The right ventricular  size is normal.   3. Left atrial size was moderately dilated.   4. Right atrial size was mild to moderately dilated.   5. The mitral valve is degenerative. Mild mitral valve regurgitation.  Moderate to severe mitral annular calcification.   6. The aortic   valve is tricuspid. There is moderate calcification of the  aortic valve. There is moderate thickening of the aortic valve. Aortic  valve regurgitation is mild. Mild aortic valve stenosis.   7. The inferior vena cava is dilated in size with <50%  respiratory  variability, suggesting right atrial pressure of 15 mmHg.   8. A temporary pacing wire is visualized in the IVC and RV.   Patient Profile     55 y.o. male with a history of ESRD, untreated OSA with PH, chronic hyperkalemia who presented with complete heart block that resolved with treatment of hyperkalemia. After TVP removal he suffered an asystolic cardiac arrest after his PTA amiodarone dose. He subsequently suffered a VT arrest the following night. He remains intubated on CRRT with TVP in place.  Assessment & Plan    Shock - septic +/- cardiogenic:  EF 50 - 55%.   Continue supportive care with vasopressors.   Has required up titration of his Norepi and his dopamine.  MAP drifting down.  Prognosis is grim and we are waiting for the family.  Comfort care without further escalation would be appropriate.    NSTEMI with known hx CAD:  Medical management of probable demand ischemia.     CHB:  Temp wire in place still but rare back up pacing needed.  Keep in place pending [place] discussion.    VT/PVCs:    On amiodarone.   Continue.  NSVT noted.    Atrial fibrillation/flutter:  NSR.  Continue IV amiodarone.   ESRD/hyperkalemia:  CRRT per nephrology.     Acute on chronic respiratory failure with hypoxia and hypercapnia:   Vent per CCM. Being managed for pneumonia.    GOC:  Palliative Care consulted.  Family is coming from [place] of the country.    For questions or updates, please contact [place] Please consult [email or web address] for contact info under Cardiology/STEMI.   Signed, Minus Breeding, MD  09/21/2021, 9:09 AM

## 2021-09-21 NOTE — Progress Notes (Signed)
ANTICOAGULATION CONSULT NOTE   Pharmacy Consult:  Heparin Indication: atrial fibrillation  Allergies  Allergen Reactions   Ace Inhibitors Cough   Pork-Derived Products Other (See Comments)    Entry determined to be clinically insignificant: OK TO GIVE HEPARIN (has tolerated well in the past)  Patient had allergic episode in 1989 after he ate pork at a fair. He developed fever and rash and went to the ED, but he did not have breathing issues. Received heparin in the past. Patient ok with removing pork allergy and listing as intolerance instead.    Shellfish-Derived Products Rash    Per ED note 01/2021    Patient Measurements: Height: 5\' 7"  (170.2 cm) Weight: 129.2 kg (284 lb 13.4 oz) IBW/kg (Calculated) : 66.1 Heparin Dosing Weight: 104 kg  Vital Signs: Temp: 96.8 F (36 C) (12/11 2000) Temp Source: Axillary (12/11 2000) BP: 97/71 (12/11 2000) Pulse Rate: 89 (12/11 2000)  Labs: Recent Labs    09/19/21 0622 09/19/21 1600 09/20/21 0435 09/20/21 1135 09/20/21 1600 09/20/21 2120 09/20/21 2323 09/21/21 0358 09/21/21 0830 09/21/21 1426 09/21/21 2138 09/21/21 2220  HGB 11.7*  --  11.0*  --   --  11.2*  --  10.3*  --   --   --  9.9*  HCT 37.7*  --  36.6*  --   --  33.0*  --  33.7*  --   --   --  29.0*  PLT 343  --  340  --   --   --   --  329  --   --   --   --   HEPARINUNFRC 0.37  --  0.90*   < >  --   --  0.64  --  0.95*  --  0.88*  --   CREATININE 2.70*   < > 2.43*  --  2.26*  --   --  2.33*  --  1.91*  --   --    < > = values in this interval not displayed.     Estimated Creatinine Clearance: 55.1 mL/min (A) (by C-G formula based on SCr of 1.91 mg/dL (H)).   Assessment: 74 YOM presented with nausea and vomiting for 2 days and syncope.  Patient was hypotensive and bradycardic, requiring transcutaneous pacing.  S/p cath with temporary transvenous pacemaker insertion.  Pharmacy consulted to start IV heparin while Coumadin is on hold.  INR is sub-therapeutic on admit.  PE noted on chest CT.  Heparin level above goal at 0.88, on 1300 units/hr. Hgb 9.9. No s/sx of bleeding or infusion issues.   Goal of Therapy:  Heparin level 0.3-0.7 units/ml Monitor platelets by anticoagulation protocol: Yes   Plan:  -Reduce heparin to 1100 units/hr -Recheck level with AM labs -Monitor HL, CBC, and for s/sx of bleeding   Antonietta Jewel, PharmD, BCCCP Clinical Pharmacist  Phone: [phone] 09/21/2021 10:32 PM  Please check AMION for all [place] phone numbers After 10:00 PM, call [place] [phone]

## 2021-09-21 NOTE — Progress Notes (Signed)
ANTICOAGULATION CONSULT NOTE   Pharmacy Consult:  Heparin Indication: atrial fibrillation  Allergies  Allergen Reactions   Ace Inhibitors Cough   Pork-Derived Products Other (See Comments)    Entry determined to be clinically insignificant: OK TO GIVE HEPARIN (has tolerated well in the past)  Patient had allergic episode in 1989 after he ate pork at a fair. He developed fever and rash and went to the ED, but he did not have breathing issues. Received heparin in the past. Patient ok with removing pork allergy and listing as intolerance instead.    Shellfish-Derived Products Rash    Per ED note 01/2021    Patient Measurements: Height: 5\' 7"  (170.2 cm) Weight: 129.2 kg (284 lb 13.4 oz) IBW/kg (Calculated) : 66.1 Heparin Dosing Weight: 104 kg  Vital Signs: Temp: 97.7 F (36.5 C) (12/11 0743) BP: 98/48 (12/11 0800) Pulse Rate: 89 (12/11 0900)  Labs: Recent Labs    09/19/21 0622 09/19/21 1600 09/20/21 0435 09/20/21 1135 09/20/21 1600 09/20/21 2120 09/20/21 2323 09/21/21 0358  HGB 11.7*  --  11.0*  --   --  11.2*  --  10.3*  HCT 37.7*  --  36.6*  --   --  33.0*  --  33.7*  PLT 343  --  340  --   --   --   --  329  HEPARINUNFRC 0.37  --  0.90* 0.86*  --   --  0.64  --   CREATININE 2.70*   < > 2.43*  --  2.26*  --   --  2.33*   < > = values in this interval not displayed.     Estimated Creatinine Clearance: 45.2 mL/min (A) (by C-G formula based on SCr of 2.33 mg/dL (H)).   Assessment: 33 YOM presented with nausea and vomiting for 2 days and syncope.  Patient was hypotensive and bradycardic, requiring transcutaneous pacing.  S/p cath with temporary transvenous pacemaker insertion.  Pharmacy consulted to start IV heparin while Coumadin is on hold.  INR is sub-therapeutic on admit. PE noted on chest CT.  Heparin level above goal this morning on recheck. No bleeding issues noted per nursing. Hgb stable 10.3, plt wnl.   Goal of Therapy:  Heparin level 0.3-0.7  units/ml Monitor platelets by anticoagulation protocol: Yes   Plan:  -Reduce heparin to 1300 units/hr -Recheck level tonight  Erin Hearing PharmD., BCPS Clinical Pharmacist 09/21/2021 12:13 PM

## 2021-09-21 NOTE — Progress Notes (Signed)
  Pt with very large amount of emesis x2.

## 2021-09-21 NOTE — Progress Notes (Signed)
[facility] Progress Note  Subjective:   CRRT running well now clotting about q24h on systemic AC On higher pressor doses, 70% FIO2 1.2L neg yesterday All 4K, last K 4.2, last P 2.7 GOC meeting today upon arrival of family  Vitals:   09/21/21 0347 09/21/21 0400 09/21/21 0600 09/21/21 0743  BP: (!) 131/38 (!) 98/56 (!) 86/49   Pulse: 98 95 88   Resp: (!) 26     Temp:    97.7 F (36.5 C)  TempSrc:      SpO2: 100% 100% 100%   Weight:   129.2 kg   Height:        Exam:   Intubted, sedated  Coarse BS b/l  regular  Abd soft ntnd no ascites   Ext no LE edema   Alert, NF, ox3    LUA AVF no bruit, temp HD cath R groin   OP HD: MW TTS   130kg  400/500  2/2 bath  LUE AVF    - calcitriol 2.25 ug tiw  - iron 87m weekly  - auryxia/ renvela binders  - lokelma 8.4 on non HD days     CXR 12/03 - vasc congestion, IS edema mild   CT 12/03 angio chest - diffuse GG changes, small lung volumes   Assessment/ Plan: Unstable bradycardia --> Asystole 09/19/2021 with replacement of temp venous pacemaker; per EP; also s/p VTach 09/17/21 req DCCV; per EP / cardiology Chronic hypotension, in shock now on 2 pressors ESRD: as above, THS.  Clotted LUE AVG, has R Fem Temp HD cath at this time.  Req CRRT for high K. Stable on 4K fluids,  Cont CRRT at this time, Clotted AVG - in LUE. IR following declot and/or TDC, not stable at this time for procedures; likely has lost the AVG given duration of clot Acute on chronic resp failure - AHRF s/p ETT 158/5during asytolic arrest, per CCM NSTEMI - ^Cleora Fleet per cardiology Volume - Cont  UF on CRRT Chronic syst/ diast CHF CAD  Atrial fib - per EP Acute encephalopathy - improved w/ dialysis Anemia ckd - cont Fe weekly , no esa needed (Hb > 10)  MBD ckd - trend Ca and PO on CRRT, P ok, replace if < 2 Chronic hyperkalemia: outpt [place] hold while on CRRT, K stable here  Cont CRRT current settings.  Palliative following for GOC.  Poor prognosis,  very unlikely to survive   RRexene Agent MD  09/21/2021, 8:09 AM   Recent Labs  Lab 09/20/21 1600 09/20/21 2120 09/21/21 0358  K 4.6 4.2 4.2  BUN 23*  --  29*  CREATININE 2.26*  --  2.33*  CALCIUM 8.5*  --  7.8*  PHOS 2.9  --  2.7  HGB  --  11.2* 10.3*    Inpatient medications:  aspirin  81 mg Per Tube Daily   atorvastatin  80 mg Per Tube Daily   B-complex with vitamin C  1 tablet Per Tube Daily   chlorhexidine gluconate (MEDLINE KIT)  15 mL Mouth Rinse BID   Chlorhexidine Gluconate Cloth  6 each Topical Q0600   clonazePAM  0.5 mg Per Tube BID   docusate  100 mg Per Tube BID   feeding supplement (PROSource TF)  90 mL Per Tube TID   insulin aspart  0-9 Units Subcutaneous Q4H   insulin aspart  3 Units Subcutaneous Q4H   lactulose  10 g Per Tube BID   mouth rinse  15 mL  Mouth Rinse 10 times per day   midodrine  15 mg Per Tube TID WC   ondansetron (ZOFRAN) IV  4 mg Intravenous Once   pantoprazole sodium  40 mg Per Tube Daily   polyethylene glycol  17 g Per Tube Daily   rOPINIRole  0.25 mg Per Tube QHS   sodium chloride flush  3 mL Intravenous Q12H   ticagrelor  90 mg Per Tube BID     prismasol BGK 4/2.5 400 mL/hr at 09/20/21 2018    prismasol BGK 4/2.5 300 mL/hr at 09/21/21 0045   sodium chloride Stopped (09/13/21 1550)   sodium chloride     sodium chloride 10 mL/hr at 09/21/21 0600   sodium chloride 10 mL/hr at 09/19/21 0200   amiodarone 30 mg/hr (09/21/21 0725)   ceFEPime (MAXIPIME) IV Stopped (09/20/21 2021)   dexmedetomidine (PRECEDEX) IV infusion 1.2 mcg/kg/hr (09/21/21 0600)   DOPamine 10 mcg/kg/min (09/21/21 0600)   feeding supplement (VITAL AF 1.2 CAL) Stopped (09/21/21 0053)   fentaNYL infusion INTRAVENOUS 100 mcg/hr (09/21/21 0629)   heparin 1,600 Units/hr (09/21/21 0600)   norepinephrine (LEVOPHED) Adult infusion 38 mcg/min (09/21/21 0600)   prismasol BGK 4/2.5 2,000 mL/hr at 09/21/21 0600   sodium chloride, sodium chloride, sodium chloride, sodium  chloride, acetaminophen, alteplase, bisacodyl, docusate, fentaNYL, fentaNYL (SUBLIMAZE) injection, fentaNYL (SUBLIMAZE) injection, heparin, lidocaine (PF), lidocaine-prilocaine, ondansetron (ZOFRAN) IV, pentafluoroprop-tetrafluoroeth, polyethylene glycol, sodium chloride flush, sodium chloride flush

## 2021-09-22 DIAGNOSIS — J9601 Acute respiratory failure with hypoxia: Secondary | ICD-10-CM | POA: Diagnosis not present

## 2021-09-22 DIAGNOSIS — R001 Bradycardia, unspecified: Secondary | ICD-10-CM | POA: Diagnosis not present

## 2021-09-22 DIAGNOSIS — I214 Non-ST elevation (NSTEMI) myocardial infarction: Secondary | ICD-10-CM | POA: Diagnosis not present

## 2021-09-22 DIAGNOSIS — R570 Cardiogenic shock: Secondary | ICD-10-CM | POA: Diagnosis not present

## 2021-09-22 LAB — POCT I-STAT 7, (LYTES, BLD GAS, ICA,H+H)
Acid-base deficit: 1 mmol/L (ref 0.0–2.0)
Bicarbonate: 25 mmol/L (ref 20.0–28.0)
Calcium, Ion: 1.12 mmol/L — ABNORMAL LOW (ref 1.15–1.40)
HCT: 30 % — ABNORMAL LOW (ref 39.0–52.0)
Hemoglobin: 10.2 g/dL — ABNORMAL LOW (ref 13.0–17.0)
O2 Saturation: 98 %
Patient temperature: 97.9
Potassium: 4.7 mmol/L (ref 3.5–5.1)
Sodium: 137 mmol/L (ref 135–145)
TCO2: 27 mmol/L (ref 22–32)
pCO2 arterial: 48 mmHg (ref 32.0–48.0)
pH, Arterial: 7.324 — ABNORMAL LOW (ref 7.350–7.450)
pO2, Arterial: 106 mmHg (ref 83.0–108.0)

## 2021-09-22 LAB — CBC
HCT: 29.3 % — ABNORMAL LOW (ref 39.0–52.0)
Hemoglobin: 9 g/dL — ABNORMAL LOW (ref 13.0–17.0)
MCH: 28.8 pg (ref 26.0–34.0)
MCHC: 30.7 g/dL (ref 30.0–36.0)
MCV: 93.9 fL (ref 80.0–100.0)
Platelets: 267 10*3/uL (ref 150–400)
RBC: 3.12 MIL/uL — ABNORMAL LOW (ref 4.22–5.81)
RDW: 23 % — ABNORMAL HIGH (ref 11.5–15.5)
WBC: 12 10*3/uL — ABNORMAL HIGH (ref 4.0–10.5)
nRBC: 0.2 % (ref 0.0–0.2)

## 2021-09-22 LAB — RENAL FUNCTION PANEL
Albumin: 1.7 g/dL — ABNORMAL LOW (ref 3.5–5.0)
Albumin: 1.6 g/dL — ABNORMAL LOW (ref 3.5–5.0)
Anion gap: 11 (ref 5–15)
Anion gap: 9 (ref 5–15)
BUN: 23 mg/dL — ABNORMAL HIGH (ref 6–20)
BUN: 23 mg/dL — ABNORMAL HIGH (ref 6–20)
CO2: 22 mmol/L (ref 22–32)
CO2: 20 mmol/L — ABNORMAL LOW (ref 22–32)
Calcium: 7.7 mg/dL — ABNORMAL LOW (ref 8.9–10.3)
Calcium: 7.7 mg/dL — ABNORMAL LOW (ref 8.9–10.3)
Chloride: 100 mmol/L (ref 98–111)
Chloride: 104 mmol/L (ref 98–111)
Creatinine, Ser: 2.02 mg/dL — ABNORMAL HIGH (ref 0.61–1.24)
Creatinine, Ser: 2.02 mg/dL — ABNORMAL HIGH (ref 0.61–1.24)
GFR, Estimated: 38 mL/min — ABNORMAL LOW (ref >60)
GFR, Estimated: 38 mL/min — ABNORMAL LOW (ref >60)
Glucose, Bld: 371 mg/dL — ABNORMAL HIGH (ref 70–99)
Glucose, Bld: 300 mg/dL — ABNORMAL HIGH (ref 70–99)
Phosphorus: 4.3 mg/dL (ref 2.5–4.6)
Phosphorus: 4.2 mg/dL (ref 2.5–4.6)
Potassium: 4.6 mmol/L (ref 3.5–5.1)
Potassium: 4.4 mmol/L (ref 3.5–5.1)
Sodium: 133 mmol/L — ABNORMAL LOW (ref 135–145)
Sodium: 133 mmol/L — ABNORMAL LOW (ref 135–145)

## 2021-09-22 LAB — HEPARIN LEVEL (UNFRACTIONATED)
Heparin Unfractionated: 0.89 IU/mL — ABNORMAL HIGH (ref 0.30–0.70)
Heparin Unfractionated: 0.71 IU/mL — ABNORMAL HIGH (ref 0.30–0.70)

## 2021-09-22 LAB — GLUCOSE, CAPILLARY
Glucose-Capillary: 352 mg/dL — ABNORMAL HIGH (ref 70–99)
Glucose-Capillary: 288 mg/dL — ABNORMAL HIGH (ref 70–99)
Glucose-Capillary: 340 mg/dL — ABNORMAL HIGH (ref 70–99)
Glucose-Capillary: 334 mg/dL — ABNORMAL HIGH (ref 70–99)
Glucose-Capillary: 298 mg/dL — ABNORMAL HIGH (ref 70–99)
Glucose-Capillary: 236 mg/dL — ABNORMAL HIGH (ref 70–99)

## 2021-09-22 LAB — MAGNESIUM: Magnesium: 2.8 mg/dL — ABNORMAL HIGH (ref 1.7–2.4)

## 2021-09-22 MED ORDER — INSULIN ASPART 100 UNIT/ML IJ SOLN
6.0000 [IU] | INTRAMUSCULAR | Status: DC
Start: 2021-09-22 — End: 2021-09-22

## 2021-09-22 MED ORDER — HEPARIN (PORCINE) 25000 UT/250ML-% IV SOLN
800.0000 [IU]/h | INTRAVENOUS | Status: DC
  Administered 2021-09-22 (×2): 900 [IU]/h via INTRAVENOUS
  Filled 2021-09-22: qty 250

## 2021-09-22 MED ORDER — INSULIN ASPART 100 UNIT/ML IJ SOLN: 6.0000 [IU] | INTRAMUSCULAR | Status: DC

## 2021-09-22 MED ORDER — INSULIN ASPART 100 UNIT/ML IJ SOLN
0.0000 [IU] | INTRAMUSCULAR | Status: DC
  Administered 2021-09-22: 11 [IU] via SUBCUTANEOUS
  Administered 2021-09-22: 15 [IU] via SUBCUTANEOUS

## 2021-09-22 MED FILL — Medication: Qty: 1 | Status: AC

## 2021-09-23 ENCOUNTER — Encounter: Payer: Self-pay | Admitting: Internal Medicine

## 2021-09-24 ENCOUNTER — Encounter: Payer: Medicare HMO | Admitting: Physical Therapy

## 2021-09-24 ENCOUNTER — Ambulatory Visit: Payer: Self-pay

## 2021-09-24 NOTE — Progress Notes (Signed)
Pt has passed away. Coumadin management episodes resolved.

## 2021-09-24 NOTE — Discharge Summary (Signed)
DEATH SUMMARY   Patient Details  Name: [name] [name] MRN: [id number] DOB: 07/13/1966  Admission/Discharge Information   Admit Date:  09-28-2021  Date of Death: Date of Death: Oct 08, 2021  Time of Death: Time of Death: Jan 11, 2135  Length of Stay: 01-06-23  Referring Physician: Hoyt Koch, MD   Reason(s) for Hospitalization  Complete heart block  Diagnoses  Preliminary cause of death: cardiogenic shock Secondary Diagnoses (including complications and co-morbidities):  Principal Problem:   Bradycardia Active Problems:   Syncope and collapse   Cardiogenic shock ([place])   CHB (complete heart block) ([place])   ESRD (end stage renal disease) on dialysis [facility])   Pneumonia   Brief Hospital Course (including significant findings, care, treatment, and services provided and events leading to death)  [name] [name] is a 55 y.o. year old male who presented with nausea and vomiting and was found to be in complete heart block and hyperkalemic. Cardiology placed a temporary pacemaker and nephrology initiated dialysis.  His heart rate improved and the temporary pacemaker wire was removed.  He then developed another episode of bradycardia.  A new pacemaker was reinserted but by this point he had developed requiring vasopressor support.  This was followed by an episode of ventricular tachycardia with further worsening of his hemodynamics.  He continues to compensate.  He developed a negative HCAP.  He can he deteriorate suffered a further cardiac arrest and had refractory shock.  Goals of care were discussed with the family.  Brother was on route from the [place] but agreed the patient should not be resuscitated if he further declined. The patient eventually expired from refractory shock.  Pertinent Labs and Studies  Significant Diagnostic Studies DG Abd 1 View  Result Date: 09/21/2021 CLINICAL DATA:  Orogastric tube placement. EXAM: ABDOMEN - 1 VIEW COMPARISON:  July 30, 2020. FINDINGS: The bowel gas  pattern is normal. Distal tip of enteric tube is seen in expected position of distal stomach. No radio-opaque calculi or other significant radiographic abnormality are seen. IMPRESSION: Distal tip of enteric tube seen in expected position of distal stomach. Electronically Signed   By: Marijo Conception M.D.   On: 09/21/2021 09:12   CT Angio Chest PE W and/or Wo Contrast  Addendum Date: 09/13/2021   ADDENDUM REPORT: 09/13/2021 00:45 ADDENDUM: Critical Value/emergent results were called by telephone at the time of interpretation on 09/13/2021 at 12:45 am to provider Dr. Lucile Shutters, who verbally acknowledged these results. Electronically Signed   By: Julian Hy M.D.   On: 09/13/2021 00:45   Result Date: 09/13/2021 CLINICAL DATA:  PE suspected, recent pacemaker placement EXAM: CT ANGIOGRAPHY CHEST WITH CONTRAST TECHNIQUE: Multidetector CT imaging of the chest was performed using the standard protocol during bolus administration of intravenous contrast. Multiplanar CT image reconstructions and MIPs were obtained to evaluate the vascular anatomy. CONTRAST:  7mL OMNIPAQUE IOHEXOL 350 MG/ML SOLN COMPARISON:  Chest radiograph dated Sep 28, 2021 FINDINGS: Cardiovascular: Isolated segmental pulmonary embolism in the right upper lobe (series 8/image 77). Enlargement of the main pulmonary artery, suggesting pulmonary arterial hypertension. Cardiomegaly.  No pericardial effusion. No evidence of thoracic aortic aneurysm. Atherosclerotic calcifications of the aortic arch. Three vessel coronary atherosclerosis. Mediastinum/Nodes: Small mediastinal lymph nodes, including a 2.0 cm short axis subcarinal node, likely reactive. Visualized thyroid is unremarkable. Lungs/Pleura: Evaluation of the lung parenchyma is constrained by respiratory motion. Within that constraint, there is interlobular septal thickening with ground-glass opacity, favoring mild interstitial edema. Superimposed mild patchy opacity in the posterior right upper  lobe (series  7/image 31), possibly reflecting mild infection/pneumonia, although favoring atelectasis. Small right pleural effusion.  No pneumothorax. Upper Abdomen: Visualized upper abdomen is grossly unremarkable, noting vascular calcifications. Musculoskeletal: Visualized osseous structures are within normal limits. Review of the MIP images confirms the above findings. IMPRESSION: Isolated segmental pulmonary embolism in the right upper lobe. Cardiomegaly with mild interstitial edema and small right pleural effusion. Superimposed mild patchy opacity in the posterior right upper lobe, favoring atelectasis, although mild infection/pneumonia is not excluded. Aortic Atherosclerosis (ICD10-I70.0). Electronically Signed: By: Julian Hy M.D. On: 09/13/2021 00:25   CARDIAC CATHETERIZATION  Result Date: 09/16/2021 1.  Successful temporary pacemaker placement with a threshold of 1.5 mA and backup rate of 60 bpm. 2.  Successful radial arterial line placement. The patient will be transferred back to the to heart unit for further evaluation and treatment.  There are no acute complications.   CARDIAC CATHETERIZATION  Result Date: 10/05/2021 Successful temporary transvenous pacemaker insertion via left femoral venous access, pacing threshold less than 0.8 mA Recommend: Pacemaker set at 80 bpm, output 10 mA.  Strict bedrest while temporary pacemaker in place.  EP team to follow.   DG Chest Port 1 View  Result Date: 09/19/2021 CLINICAL DATA:  Difficulty breathing EXAM: PORTABLE CHEST 1 VIEW COMPARISON:  Previous studies including the examination of 09/16/2021 FINDINGS: Transverse diameter of heart is increased. Tip endotracheal tube is 5.2 cm above the carina. Enteric tube is noted traversing the esophagus. There is interval decrease in pulmonary vascular congestion. There is blunting of left lateral CP angle. No new focal infiltrates are seen. There is no pneumothorax. IMPRESSION: Cardiomegaly. There is  interval decrease in pulmonary vascular congestion and pulmonary edema. There are no new focal infiltrates. Small left pleural effusion. Electronically Signed   By: Elmer Picker M.D.   On: 09/19/2021 09:10   DG CHEST PORT 1 VIEW  Result Date: 09/16/2021 CLINICAL DATA:  Check gastric catheter placement EXAM: PORTABLE CHEST 1 VIEW COMPARISON:  09/14/2021 FINDINGS: The tracheal tube is noted in satisfactory position. Gastric catheter is noted extending into the stomach. Cardiac shadow is enlarged but stable. Vascular congestion is noted with mild interstitial edema consistent with CHF. No bony abnormality is noted. IMPRESSION: Increasing CHF. Tubes and lines in satisfactory position. Electronically Signed   By: Inez Catalina M.D.   On: 09/16/2021 02:22   DG CHEST PORT 1 VIEW  Result Date: 09/14/2021 CLINICAL DATA:  Dyspnea EXAM: PORTABLE CHEST 1 VIEW COMPARISON:  09/13/2021 FINDINGS: Lungs are clear. No pneumothorax or pleural effusion. Cardiomegaly is stable. Inferiorly approaching trans venous pacemaker lead is unchanged overlying the expected right ventricle. Pulmonary vascularity is stable with trace perihilar pulmonary edema again noted. No acute bone abnormality. Vascular stent noted within the right axilla. IMPRESSION: Stable cardiomegaly and trace perihilar pulmonary edema. Electronically Signed   By: Fidela Salisbury M.D.   On: 09/14/2021 20:36   DG Chest Port 1 View  Result Date: 09/13/2021 CLINICAL DATA:  55 year old male central line placement. Unsuccessful right IJ dialysis catheter placement. Subsequent right femoral venous catheter placement. EXAM: PORTABLE CHEST 1 VIEW COMPARISON:  CTA chest 0003 hours today. FINDINGS: Portable AP semi upright view at 0543 hours. An inferior approach thin caliber central line extends through the inferior cavoatrial junction and projects over the right heart. This was subtle but present on the earlier CTA. Stable cardiomegaly and mediastinal contours.  Visualized tracheal air column is within normal limits. No pneumothorax. Right axillary vascular stent. Small right pleural effusion better demonstrated by  CTA. Continued pulmonary vascular congestion. No consolidation. IMPRESSION: 1. Inferior approach central line tracks through the inferior cavoatrial junction and loops over the right heart. This was subtle but present on the earlier CTA. 2. No pneumothorax. 3. Stable cardiomegaly, pulmonary vascular congestion/mild edema. Electronically Signed   By: Genevie Ann M.D.   On: 09/13/2021 07:21   DG Chest Port 1 View  Result Date: 09/14/2021 CLINICAL DATA:  Altered mental status EXAM: PORTABLE CHEST 1 VIEW COMPARISON:  Chest x-ray 02/16/2021 FINDINGS: Heart is enlarged. Mediastinum appears grossly stable. Pulmonary vascular prominence. No focal consolidation visualized. No significant pleural effusion. No pneumothorax. Somewhat limited due to body habitus and portable technique. IMPRESSION: Cardiomegaly with pulmonary vascular congestion. Electronically Signed   By: Ofilia Neas M.D.   On: 09/14/2021 14:51   ECHOCARDIOGRAM COMPLETE  Result Date: 09/14/2021    ECHOCARDIOGRAM REPORT   Patient Name:   [name] [name] Date of Exam: 09/14/2021 Medical Rec #:  [id number]      Height:       70.0 in Accession #:    [id number]     Weight:       280.0 lb Date of Birth:  25-May-1966      BSA:          2.408 m Patient Age:    72 years       BP:           115/65 mmHg Patient Gender: M              HR:           80 bpm. Exam Location:  Inpatient Procedure: 2D Echo, Color Doppler, Cardiac Doppler and Intracardiac            Opacification Agent Indications:    cardiac arrest  History:        Patient has no prior history of Echocardiogram examinations.                 Risk Factors:Hypertension.  Sonographer:    Melissa Morford RDCS (AE, PE) Referring Phys: [place]  1. Technically difficult study despite use of definity contrast due to off axis views and poor  visualization of all endocardial segments. Left ventricular ejection fraction, by estimation, is 50 to 55%. The left ventricle has low normal function. Left ventricular endocardial border not optimally defined to evaluate regional wall motion. The left ventricular internal cavity size was mildly dilated. There is mild asymmetric left ventricular hypertrophy of the basal-septal segment. Left ventricular diastolic parameters are consistent with Grade III diastolic dysfunction (restrictive).  2. Right ventricular systolic function is normal. The right ventricular size is normal.  3. Left atrial size was moderately dilated.  4. Right atrial size was mild to moderately dilated.  5. The mitral valve is degenerative. Mild mitral valve regurgitation. Moderate to severe mitral annular calcification.  6. The aortic valve is tricuspid. There is moderate calcification of the aortic valve. There is moderate thickening of the aortic valve. Aortic valve regurgitation is mild. Mild aortic valve stenosis.  7. The inferior vena cava is dilated in size with <50% respiratory variability, suggesting right atrial pressure of 15 mmHg.  8. A temporary pacing wire is visualized in the IVC and RV. Comparison(s): No prior Echocardiogram. FINDINGS  Left Ventricle: Technically difficult study despite use of definity contrast due to off axis views and poor visualization of all endocardial segments. Left ventricular ejection fraction, by estimation, is 50 to 55%. The left ventricle has low  normal function. Left ventricular endocardial border not optimally defined to evaluate regional wall motion. Definity contrast agent was given IV to delineate the left ventricular endocardial borders. The left ventricular internal cavity size was mildly dilated. There is mild asymmetric left ventricular hypertrophy of the basal-septal segment. Abnormal (paradoxical) septal motion, consistent with RV pacemaker. Left ventricular diastolic parameters are  consistent with Grade III diastolic dysfunction (restrictive). Right Ventricle: The right ventricular size is normal. Right vetricular wall thickness was not well visualized. Right ventricular systolic function is normal. Left Atrium: Left atrial size was moderately dilated. Right Atrium: Right atrial size was mild to moderately dilated. Pericardium: There is no evidence of pericardial effusion. Mitral Valve: The mitral valve is degenerative in appearance. There is moderate thickening of the mitral valve leaflet(s). There is moderate calcification of the mitral valve leaflet(s). Moderate to severe mitral annular calcification. Mild mitral valve regurgitation. MV peak gradient, 10.4 mmHg. The mean mitral valve gradient is 4.0 mmHg. Tricuspid Valve: The tricuspid valve is normal in structure. Tricuspid valve regurgitation is trivial. Aortic Valve: The aortic valve is tricuspid. There is moderate calcification of the aortic valve. There is moderate thickening of the aortic valve. Aortic valve regurgitation is mild. Mild aortic stenosis is present. Aortic valve mean gradient measures 8.5 mmHg. Aortic valve peak gradient measures 15.7 mmHg. Aortic valve area, by VTI measures 2.28 cm. Pulmonic Valve: The pulmonic valve was normal in structure. Pulmonic valve regurgitation is mild. Aorta: The aortic root is normal in size and structure. Venous: Temporary pacing wire seen in IVC. The inferior vena cava is dilated in size with less than 50% respiratory variability, suggesting right atrial pressure of 15 mmHg. IAS/Shunts: There is right bowing of the interatrial septum, suggestive of elevated left atrial pressure. No atrial level shunt detected by color flow Doppler. Additional Comments: A device lead is visualized.  LEFT VENTRICLE PLAX 2D LVIDd:         5.90 cm LVIDs:         4.70 cm LV PW:         1.10 cm LV IVS:        1.00 cm LVOT diam:     2.25 cm LV SV:         70 LV SV Index:   29 LVOT Area:     3.98 cm  RIGHT  VENTRICLE TAPSE (M-mode): 1.2 cm LEFT ATRIUM              Index        RIGHT ATRIUM           Index LA diam:        5.50 cm  2.28 cm/m   RA Area:     28.80 cm LA Vol (A2C):   101.0 ml 41.94 ml/m  RA Volume:   93.80 ml  38.95 ml/m LA Vol (A4C):   118.0 ml 49.00 ml/m LA Biplane Vol: 116.0 ml 48.17 ml/m  AORTIC VALVE AV Area (Vmax):    2.15 cm AV Area (Vmean):   2.06 cm AV Area (VTI):     2.28 cm AV Vmax:           198.00 cm/s AV Vmean:          138.000 cm/s AV VTI:            0.308 m AV Peak Grad:      15.7 mmHg AV Mean Grad:      8.5 mmHg LVOT Vmax:  107.00 cm/s LVOT Vmean:        71.467 cm/s LVOT VTI:          0.176 m LVOT/AV VTI ratio: 0.57  AORTA Ao Root diam: 3.50 cm MITRAL VALVE                TRICUSPID VALVE MV Area (PHT): 5.50 cm     TR Peak grad:   43.8 mmHg MV Area VTI:   2.39 cm     TR Vmax:        331.00 cm/s MV Peak grad:  10.4 mmHg MV Mean grad:  4.0 mmHg     SHUNTS MV Vmax:       1.61 m/s     Systemic VTI:  0.18 m MV Vmean:      90.5 cm/s    Systemic Diam: 2.25 cm MV Decel Time: 138 msec MV E velocity: 158.00 cm/s MV A velocity: 66.60 cm/s MV E/A ratio:  2.37 Gwyndolyn Kaufman MD Electronically signed by Gwyndolyn Kaufman MD Signature Date/Time: 09/14/2021/1:08:15 PM    Final     Microbiology Recent Results (from the past 240 hour(s))  Culture, Respiratory w Gram Stain     Status: None   Collection Time: 09/17/21  5:44 PM   Specimen: Tracheal Aspirate; Respiratory  Result Value Ref Range Status   Specimen Description TRACHEAL ASPIRATE  Final   Special Requests NONE  Final   Gram Stain   Final    NO ORGANISMS SEEN ABUNDANT GRAM NEGATIVE RODS RARE GRAM POSITIVE COCCI RARE GRAM POSITIVE RODS    Culture   Final    ABUNDANT ENTEROBACTER CLOACAE ABUNDANT DIPHTHEROIDS(CORYNEBACTERIUM SPECIES) Standardized susceptibility testing for this organism is not available. Performed at [facility], [place] [address] [place], [place] [address]    Report Status 09/20/2021 FINAL   Final   Organism ID, Bacteria ENTEROBACTER CLOACAE  Final      Susceptibility   Enterobacter cloacae - MIC*    CEFAZOLIN >=64 RESISTANT Resistant     CEFEPIME <=0.12 SENSITIVE Sensitive     CEFTAZIDIME <=1 SENSITIVE Sensitive     CIPROFLOXACIN <=0.25 SENSITIVE Sensitive     GENTAMICIN <=1 SENSITIVE Sensitive     IMIPENEM <=0.25 SENSITIVE Sensitive     TRIMETH/SULFA <=20 SENSITIVE Sensitive     PIP/TAZO <=4 SENSITIVE Sensitive     * ABUNDANT ENTEROBACTER CLOACAE  MRSA Next Gen by PCR, Nasal     Status: None   Collection Time: 09/17/21  6:37 PM   Specimen: Nasal Mucosa; Nasal Swab  Result Value Ref Range Status   MRSA by PCR Next Gen NOT DETECTED NOT DETECTED Final    Comment: (NOTE) The GeneXpert MRSA Assay (FDA approved for NASAL specimens only), is one component of a comprehensive MRSA colonization surveillance program. It is not intended to diagnose MRSA infection nor to guide or monitor treatment for MRSA infections. Test performance is not FDA approved in patients less than 1 years old. Performed at [facility], [place] [address] [place], [place] [address]     Lab Basic Metabolic Panel: Recent Labs  Lab 09/19/21 0622 09/19/21 1600 09/19/21 1649 09/20/21 0435 09/20/21 1600 09/20/21 2120 09/21/21 0358 09/21/21 1426 09/21/21 2220 2021/10/18 0335 18-Oct-2021 0533 18-Oct-2021 1616  NA 136   < >  --  134* 136   < > 134* 136 137 133* 137 133*  K 4.2   < >  --  4.2 4.6   < > 4.2 4.9 4.4 4.6 4.7  4.4  CL 101   < >  --  101 102  --  102 102  --  100  --  104  CO2 23   < >  --  24 25  --  22 25  --  22  --  20*  GLUCOSE 180*   < >  --  291* 300*  --  282* 278*  --  371*  --  300*  BUN 15   < >  --  19 23*  --  29* 23*  --  23*  --  23*  CREATININE 2.70*   < >  --  2.43* 2.26*  --  2.33* 1.91*  --  2.02*  --  2.02*  CALCIUM 8.2*   < >  --  7.9* 8.5*  --  7.8* 8.3*  --  7.7*  --  7.7*  MG 2.5*  --  2.5* 2.5*  --   --  2.3  --   --  2.8*  --   --   PHOS 2.8   2.8   < > 2.8  2.6 2.9  --  2.7  --   --  4.3  --  4.2   < > = values in this interval not displayed.   Liver Function Tests: Recent Labs  Lab 09/20/21 1600 09/21/21 0358 09/21/21 1426 2021-09-23 0335 2021/09/23 1616  AST  --   --  80*  --   --   ALT  --   --  54*  --   --   ALKPHOS  --   --  92  --   --   BILITOT  --   --  3.7*  --   --   PROT  --   --  7.1  --   --   ALBUMIN 2.0* 1.9* 2.0* 1.7* 1.6*   No results for input(s): LIPASE, AMYLASE in the last 168 hours. No results for input(s): AMMONIA in the last 168 hours. CBC: Recent Labs  Lab 09/18/21 0156 09/19/21 0622 09/20/21 0435 09/20/21 2120 09/21/21 0358 09/21/21 2220 2021/09/23 0335 September 23, 2021 0533  WBC 9.0 11.9* 11.6*  --  11.2*  --  12.0*  --   NEUTROABS  --  9.9*  --   --   --   --   --   --   HGB 10.6* 11.7* 11.0* 11.2* 10.3* 9.9* 9.0* 10.2*  HCT 34.8* 37.7* 36.6* 33.0* 33.7* 29.0* 29.3* 30.0*  MCV 97.5 97.2 98.1  --  95.7  --  93.9  --   PLT 306 343 340  --  329  --  267  --    Cardiac Enzymes: No results for input(s): CKTOTAL, CKMB, CKMBINDEX, TROPONINI in the last 168 hours. Sepsis Labs: Recent Labs  Lab 09/17/21 1837 09/18/21 0156 09/18/21 0156 09/19/21 0622 09/20/21 0435 09/21/21 0358 09/21/21 1426 09-23-2021 0335  PROCALCITON 8.76 8.37  --  9.49  --   --   --   --   WBC  --  9.0   < > 11.9* 11.6* 11.2*  --  12.0*  LATICACIDVEN  --   --   --   --   --   --  3.2*  --    < > = values in this interval not displayed.    Procedures/Operations  Mechanical ventilation, hemodialysis.   [name] [name] 09/24/2021, 4:26 PM

## 2021-09-24 NOTE — Telephone Encounter (Signed)
Pt has passed away. Episodes for coumadin management resolved in chart.

## 2021-09-25 ENCOUNTER — Telehealth: Payer: Self-pay | Admitting: Internal Medicine

## 2021-09-25 NOTE — Telephone Encounter (Signed)
Typically since he died in hospital they would fill out. I can if needed. I do not know what a non-communicable disease letter is can they clarify. [place] death certificates are done on the [facility] dave website.

## 2021-09-25 NOTE — Telephone Encounter (Signed)
See below

## 2021-09-25 NOTE — Telephone Encounter (Signed)
[name] [name] from [facility] calling in  [name] [name] in to confirm that Dr. Sharlet Salina would be signing off on patient death certificate  Also says patient body [place] being [place] out of the [place] & the airline is requesting a Non-Communicable Disease Letter  Callback # [phone]

## 2021-09-26 ENCOUNTER — Encounter: Payer: Medicare HMO | Admitting: Physical Therapy

## 2021-09-26 NOTE — Telephone Encounter (Signed)
Spoke with Roderic Palau and the hospital told him they would fill out his death certificate. He also mentioned that in the letter it needs to state the patient died from a non-communicable disease.   Roderic Palau would like our office to e-mail him the death certificate and letter  [email or web address]

## 2021-09-26 NOTE — Telephone Encounter (Signed)
Letter done. As previously stated [place] DAVE is the website the [facility] uses for death certificates and they can be found and submitted there. We do not have a copy to email.

## 2021-09-29 ENCOUNTER — Encounter: Payer: Medicare HMO | Admitting: Physical Therapy

## 2021-10-01 ENCOUNTER — Encounter: Payer: Medicare HMO | Admitting: Physical Therapy

## 2021-10-01 ENCOUNTER — Ambulatory Visit: Payer: Medicare HMO | Admitting: Internal Medicine

## 2021-10-08 ENCOUNTER — Encounter: Payer: Medicare HMO | Admitting: Physical Therapy

## 2021-10-10 ENCOUNTER — Encounter: Payer: Medicare HMO | Admitting: Physical Therapy

## 2021-10-12 NOTE — Progress Notes (Signed)
  Transition of Care (TOC) Screening Note   Patient Details  Name: [name] [name] Date of Birth: 1966-07-23   Transition of Care [facility]) CM/SW Contact:    [place], LCSW Phone Number: 10/11/2021, 3:00 PM    Transition of Care Department [facility]) has reviewed patient and no TOC needs have been identified at this time. Patient is critically ill and intubated. We will continue to monitor patient advancement through interdisciplinary progression rounds. If new patient transition needs arise, please place a TOC consult.

## 2021-10-12 NOTE — Progress Notes (Signed)
RT note: RT removed patient from vent per Family and RN request.

## 2021-10-12 NOTE — Progress Notes (Signed)
NAME:  [name] [name], MRN:  [id number], DOB:  1966/03/15, LOS: 16 ADMISSION DATE:  09/11/2021, CONSULTATION DATE:  10/09/2021 REFERRING MD:  Kathrynn Humble CHIEF COMPLAINT: Syncope   History of Present Illness:  56 year old man who presented to [facility] 12/2 via EMS with nausea and vomiting, R sided CP and syncope on day of admission. PMHx significant for ESRD (on HD TTS via LUE AVF) c/b chronic hyperkalemia, advanced CHF, pulmonary hypertension, Afib, DVT (on warfarin).  On EMS evaluation, patient was bradycardic to 30s-40s prompting initiation of transcutaneous pacing (started en route). On ED arrival, arterial line was placed. Labs were notable for trop 4k, K 6.6 (iStat). Ca gluconate and 2 amps bicarb were administered. Levo was initiated. Patient remained dependent on transcutaneous pacing. Cardiology/EP and Nephrology were consulted. Patient was taken to cath lab 12/2 for TVP placement. Temp wires removed 12/5, however patient had recurrence of bradycardia/progressive pauses necessitating repeat TVP placement 12/5PM.  Pertinent Medical History:  ESRD DVT Chronic systolic and diastolic heart failure Multifactorial pulmonary hypertension CAD OSA (not using CPAP)  Significant Hospital Events: Including procedures, antibiotic start and stop dates in addition to other pertinent events   10/08/2021 arterial line placed, transcutaneous pacing, transvenous pacemaker placed in cath lab, Started on iHD 12/3 HD per TTS schedule 12/5 Temp wire pulled, AV conduction improved. L groin sheath removed. Bradycardic with progressive pauses later in the evening prompting replacement of TVP. Intubated for procedure with escalation of pressor/hemodynamic support. 12/6 PCM consulted.  12/7 VT overnight. Required Cardioversion.  Family conversation, brother coming from [place] to participate in goals of care remaining full code until brother can be present at bedside. Cefepime started for PNA. EP and advanced HF consulted.  IF he can be extubated and get off pressors leadless PM to be considered.  12/8 intermittently dropping blood pressure requiring escalating norepinephrine and dopamine. Precedex started  12/9 cxr improved. Pct still elevated. Resp Cultures w/ many GNR (still pending) . Dc fent.gtt starting PRN 12/10 aggressive bowel regimen attempted, unable to tolerate enema 12/11 cardiac arrest x 1 round CPR, ROSC achieved, worsening shock.  Interim History / Subjective:  Remains in refractory shock and on high-dose pressors Objective:  Blood pressure (Abnormal) 95/56, pulse 96, temperature 97.9 F (36.6 C), temperature source Oral, resp. rate (Abnormal) 27, height 5\' 7"  (1.702 m), weight 134 kg, SpO2 100 %.    Vent Mode: PRVC FiO2 (%):  [50 %-100 %] 50 % Set Rate:  [24 bmp] 24 bmp Vt Set:  [540 mL] 540 mL PEEP:  [10 ASN05-39 cmH20] 10 cmH20 Plateau Pressure:  [8 cmH20-24 cmH20] 23 cmH20   Intake/Output Summary (Last 24 hours) at 2021/10/16 0803 Last data filed at 2021-10-16 0700 Gross per 24 hour  Intake 6330.87 ml  Output 2071 ml  Net 4259.87 ml   Filed Weights   09/20/21 0500 09/21/21 0600 2021-10-16 0630  Weight: 127.8 kg 129.2 kg 134 kg   Physical Examination:  General: Intubated, in refractory multiple organ failure/shock HEENT normocephalic atraumatic orally intubated Pulmonary: Coarse scattered rhonchi diminished bases Cardiac: Wide-complex tachycardia Abdomen obese bowel sounds absent Extremities mottled and cool Neuro Will open eyes and follow commands but gets agitated quite quickly GU an uric   Resolved Hospital Problem List:  Toxic metabolic encephalopathy due to under-dialysis and hypotension Hyperkalemia  PEA Arrest 12/11 afternoon ROSC achieved after 1 round CPR. Unclear etiology.  Enterobacter and diphtheroids pneumonia.  Completed 5 days of cefepime Assessment & Plan:   Cardiogenic shock due to complete heart block  with slow escape requiring TVPM and titration of  NE NSTEMI  Chronic systolic and diastolic heart failure due to ICM Grade 3 diastolic dysfunction CAD Plan Continuing norepinephrine, dopamine, epinephrine, and vasopressin infusions  Keep euvolemic  Keeping transvenous pacer for now  Continue Brilinta and atorvastatin  Not a candidate for goal-directed medical therapy given shock state    Persistent atrial fibrillation  Sustained VT (12/7) and again on 12/11 DVT RUL segmental PE 12/3 Plan Continue telemetry Continue amiodarone    Acute-on-chronic hypoxemic respiratory failure requiring mechanical ventilation OSA, untreated Likely OHS Patient has OSA, but does not utilize CPAP as an outpatient. Likely some degree of OHS.   Plan continue full ventilator support with PEEP set at 10 given body habitus VAP bundle PAD protocol RASS goal -2   ESRD with chronic, dialysis-dependent Chronic hypotension Complication of LUE AVF - Nephrology following, appreciate assistance Plan Continue CRRT  If he were to survive would need fistulogram/declotting versus catheter  Continue midodrine 15 mg via tube 3 times daily    DM type 2  -hyperglycemic overnight Plan Increase sliding scale insulin to resistant Continue additional 3 units basal dosing every 4 hours Hold off on more long-acting basal dosing for now  Inadequate oral intake.  Ileus H/o MO Plan Bowel regimen ordered, unfortunately I think much of this is secondary to bowel ischemia Continue Reglan   Best Practice: (right click and "Reselect all SmartList Selections" daily)   Diet/type: tubefeeds DVT prophylaxis: systemic heparin GI prophylaxis: PPI Lines: Central line Foley:  N/A Code Status: full code Last date of multidisciplinary goals of care discussion [brother still in transit to the [place] from [place]. Family friend coming to bedside, will update in person when he is here.]  Critical care time: 32 minutes   Erick Colace ACNP-BC [place] Pager # [phone] OR # [phone] if no answer

## 2021-10-12 NOTE — Significant Event (Addendum)
At 01-22-2029 pt BP began to rapidly decline despite continuing treatment. Pt remained in an agonal rhythm until  01/22/29. At this time family was amendable to discontinuing ventilator and all medications. Time of death 01/23/2135 Verified with Gust Rung Ogan,MD notified All belonging in room returned to brother,Hafid Glean Hess, RN

## 2021-10-12 NOTE — Progress Notes (Signed)
Inpatient Diabetes Program Recommendations  AACE/ADA: New Consensus Statement on Inpatient Glycemic Control (2015)  Target Ranges:  Prepandial:   less than 140 mg/dL      Peak postprandial:   less than 180 mg/dL (1-2 hours)      Critically ill patients:  140 - 180 mg/dL   Lab Results  Component Value Date   GLUCAP 340 (H) October 21, 2021     Current orders for Inpatient glycemic control: Novolog 3 units Q4H, Novolog 0-9 units q4H Vital @ 60 ml/hr  Inpatient Diabetes Program Recommendations:    Consider increasing Novolog 6 units Q4H for tube feed coverage (to be stopped or held in the event tube feeds are stopped).   Thanks, Bronson Curb, MSN, RNC-OB Diabetes Coordinator [phone] (8a-5p)

## 2021-10-12 NOTE — Progress Notes (Signed)
Interdisciplinary Goals of Care Family Meeting    Date carried out:: 10-20-2021  Location of the meeting: Conference room  Member's involved: Nurse Practitioner  Durable Power of Attorney or acting medical decision maker: The patient's brother  Mr Norton Pastel.   Discussion: We discussed goals of care for [facility] .  We discussed how Mr Boodram has declined since we spoke last week and at this point he is now in multiple organ failure w/ refractory shock in spite of maximized support.   Code status: Full DNR  Disposition: Continue current acute care. At this point will continue current level of support  but he will be DNR should he suffer another cardiac arrest. I have written a letter in hopes that it may help his brother arrange one of his other brothers to arrive here.   Time spent for the meeting: 23 min   Clementeen Graham 20-Oct-2021 1:03 PM  Erick Colace ACNP-BC [place] Pager # [phone] OR # [phone] if no answer

## 2021-10-12 NOTE — Progress Notes (Signed)
[facility] Progress Note  Subjective:   Vitals:   2021/10/14 1100 10-14-21 1115 14-Oct-2021 1128 October 14, 2021 1129  BP:   (!) 97/48   Pulse: 94 95 95   Resp:   (!) 24   Temp:      TempSrc:      SpO2: 100% 100% 100% 100%  Weight:      Height:        Exam:   Intubted, sedated  Coarse BS b/l  regular  Abd soft ntnd no ascites   Ext no LE edema   Alert, NF, ox3    LUA AVF no bruit, temp HD cath R groin   OP HD: MW TTS   130kg  400/500  2/2 bath  LUE AVF    - calcitriol 2.25 ug tiw  - iron 66m weekly  - auryxia/ renvela binders  - lokelma 8.4 on non HD days     CXR 12/03 - vasc congestion, IS edema mild   CT 12/03 angio chest - diffuse GG changes, small lung volumes   Assessment/ Plan: Unstable bradycardia --> Asystole 09/13/2021 with replacement of temp venous pacemaker per EP; then had VTach 09/17/21 req DCCV; per EP/cardiology Cardiogenic shock - on sig pressor support Chronic hypotension ESRD: usual is HD TTS.  Has clotted LUE AVG, using R Fem Temp HD cath at this time.  Requiring CRRT d/t shock state. Cont CRRT.  Clotted AVG - in LUE. Not stable at this time for procedures; likely has lost the AVG given duration of clot.  Acute on chronic resp failure - AHRF s/p ETT 116/1during asytolic arrest Enterobacter/ diptheroids PNA - on vent, per CCM NSTEMI  Volume - Cont  UF on CRRT H/o chronic syst/ diast CHF H/o CAD Anemia ckd - cont Fe weekly , no esa needed (Hb >9- 10)  MBD ckd - trend Ca and PO on CRRT, P ok, replace if < 2  Palliative following for GOC.  Poor prognosis, unlikely to survive   [name] Blazing MD  101-12-2021 11:38 AM   Recent Labs  Lab 09/21/21 0358 09/21/21 1426 09/21/21 2220 [id number] 101/03/230533  K 4.2 4.9   < > 4.6 4.7  BUN 29* 23*  --  23*  --   CREATININE 2.33* 1.91*  --  2.02*  --   CALCIUM 7.8* 8.3*  --  7.7*  --   PHOS 2.7  --   --  4.3  --   HGB 10.3*  --    < > 9.0* 10.2*   < > = values in this interval not  displayed.    Inpatient medications:  aspirin  81 mg Per Tube Daily   atorvastatin  80 mg Per Tube Daily   B-complex with vitamin C  1 tablet Per Tube Daily   chlorhexidine gluconate (MEDLINE KIT)  15 mL Mouth Rinse BID   Chlorhexidine Gluconate Cloth  6 each Topical Q0600   clonazePAM  0.5 mg Per Tube BID   docusate  100 mg Per Tube BID   feeding supplement (PROSource TF)  90 mL Per Tube TID   insulin aspart  0-20 Units Subcutaneous Q4H   insulin aspart  6 Units Subcutaneous Q4H   lactulose  10 g Per Tube BID   mouth rinse  15 mL Mouth Rinse 10 times per day   metoCLOPramide (REGLAN) injection  5 mg Intravenous Q12H   midodrine  15 mg Per Tube TID WC   ondansetron (ZOFRAN) IV  4 mg Intravenous Once   pantoprazole sodium  40 mg Per Tube Daily   polyethylene glycol  17 g Per Tube Daily   rOPINIRole  0.25 mg Per Tube QHS   sodium chloride flush  3 mL Intravenous Q12H   ticagrelor  90 mg Per Tube BID     prismasol BGK 4/2.5 400 mL/hr at 01-Oct-2021 0034    prismasol BGK 4/2.5 300 mL/hr at 09/21/21 2036   sodium chloride Stopped (09/13/21 1550)   sodium chloride     sodium chloride 10 mL/hr at 2021/10/01 1100   sodium chloride 10 mL/hr at 09/19/21 0200   amiodarone 30 mg/hr (2021/10/01 1100)   ceFEPime (MAXIPIME) IV Stopped (10-01-21 0818)   dexmedetomidine (PRECEDEX) IV infusion Stopped (09/21/21 1406)   DOPamine 50 mcg/kg/min (10-01-2021 1100)   epinephrine 23 mcg/min (2021/10/01 1100)   feeding supplement (VITAL AF 1.2 CAL) Stopped (09/21/21 0053)   fentaNYL infusion INTRAVENOUS 350 mcg/hr (2021/10/01 1100)   heparin 900 Units/hr (10/01/2021 1100)   norepinephrine (LEVOPHED) Adult infusion 100 mcg/min (10/01/2021 1100)   prismasol BGK 4/2.5 2,000 mL/hr at 10-01-21 1042   vasopressin 0.04 Units/min (2021/10/01 1100)   sodium chloride, sodium chloride, sodium chloride, sodium chloride, acetaminophen, albuterol, alteplase, bisacodyl, docusate, fentaNYL, fentaNYL (SUBLIMAZE) injection, fentaNYL  (SUBLIMAZE) injection, heparin, lidocaine (PF), lidocaine-prilocaine, midazolam, ondansetron (ZOFRAN) IV, pentafluoroprop-tetrafluoroeth, polyethylene glycol, sodium chloride flush, sodium chloride flush

## 2021-10-12 NOTE — Progress Notes (Signed)
      To Whom it may concern,  Please accept this letter as verification that Mr [name] [name]  DOB: 06/04/1966 is a patient here in the cardiothoracic ICU here at [facility] in [facility]. He has been under the care of the critical care service since 10/08/2021 and at this point is actively dying in spite of all life sustaining interventions including mechanical ventilation, intravenous vasoactive infusions and continuous dialysis. His siblings are required here at our hospital to assist with on going medical decision making as well as to support their brother during the dying process.   Thank you for your support.    Erick Colace ACNP-BC [place]

## 2021-10-12 NOTE — Progress Notes (Addendum)
ANTICOAGULATION CONSULT NOTE   Pharmacy Consult:  Heparin Indication: atrial fibrillation  Allergies  Allergen Reactions   Ace Inhibitors Cough   Pork-Derived Products Other (See Comments)    Entry determined to be clinically insignificant: OK TO GIVE HEPARIN (has tolerated well in the past)  Patient had allergic episode in 1989 after he ate pork at a fair. He developed fever and rash and went to the ED, but he did not have breathing issues. Received heparin in the past. Patient ok with removing pork allergy and listing as intolerance instead.    Shellfish-Derived Products Rash    Per ED note 01/2021    Patient Measurements: Height: 5\' 7"  (170.2 cm) Weight: 134 kg (295 lb 6.7 oz) IBW/kg (Calculated) : 66.1 Heparin Dosing Weight: 104 kg  Vital Signs: Temp: 99.2 F (37.3 C) (12/12 1600) Temp Source: Axillary (12/12 1600) BP: 66/43 (12/12 1600) Pulse Rate: 92 (12/12 1715)  Labs: Recent Labs    09/20/21 0435 09/20/21 1135 09/21/21 0358 09/21/21 0830 09/21/21 1426 09/21/21 2138 09/21/21 2220 October 21, 2021 0335 21-Oct-2021 0533 10-21-2021 0648 10/21/2021 1616  HGB 11.0*   < > 10.3*  --   --   --  9.9* 9.0* 10.2*  --   --   HCT 36.6*   < > 33.7*  --   --   --  29.0* 29.3* 30.0*  --   --   PLT 340  --  329  --   --   --   --  267  --   --   --   HEPARINUNFRC 0.90*   < >  --    < >  --  0.88*  --   --   --  0.89* 0.71*  CREATININE 2.43*   < > 2.33*  --  1.91*  --   --  2.02*  --   --   --    < > = values in this interval not displayed.    Estimated Creatinine Clearance: 53.2 mL/min (A) (by C-G formula based on SCr of 2.02 mg/dL (H)).   Assessment: 38 YOM presented with nausea and vomiting for 2 days and syncope. Patient was hypotensive and bradycardic, requiring transcutaneous pacing. S/p cath with temporary transvenous pacemaker insertion.  Pharmacy consulted to start IV heparin while Coumadin is on hold.  INR is sub-therapeutic on admit. PE noted on chest CT.  Heparin level  remains elevated but trending down after holding and adjusting down rate earlier today. Hgb drifting down, pltc wnl. No bleeding or issues with infusion per RN.   Goal of Therapy:  Heparin level 0.3-0.7 units/ml Monitor platelets by anticoagulation protocol: Yes   Plan:  -Reduce heparin to 800 units/h -Recheck heparin level with AM labs  Sloan Leiter, PharmD, BCPS, BCCCP Clinical Pharmacist Please refer to [facility] for [facility] Pharm[facility] numbers 10-21-2021

## 2021-10-12 NOTE — Progress Notes (Signed)
  Remains intubated and sedated.   Code noted from yesterday. Brief CPR noted.   Intermittent pacing noted overnight in setting of arrest.   Remains on multiple pressors with grim prognosis. Per notes, family will be here soon to further discuss code status and goals of care.   He is not currently a candidate for permanent pacing. We will continue to follow his course.   Above reviewed with Dr. Ronni Rumble "[address] [place], [place]  30-Sep-2021 7:46 AM

## 2021-10-12 NOTE — Progress Notes (Signed)
eLink Physician-Brief Progress Note Patient Name: Kenlee Maler DOB: 02/22/1966 MRN: [id number]   Date of Service  2021-10-12  HPI/Events of Note  Patient expired.  eICU Interventions  Nursing communication order entered for bedside RN to turn off pacer with magnet, and  for bedside RN's to pronounce death.        [name] Eamon Tantillo 10/12/2021, 9:14 PM

## 2021-10-12 NOTE — Telephone Encounter (Signed)
Pt is still admitted.  

## 2021-10-12 NOTE — Progress Notes (Signed)
ANTICOAGULATION CONSULT NOTE   Pharmacy Consult:  Heparin Indication: atrial fibrillation  Allergies  Allergen Reactions   Ace Inhibitors Cough   Pork-Derived Products Other (See Comments)    Entry determined to be clinically insignificant: OK TO GIVE HEPARIN (has tolerated well in the past)  Patient had allergic episode in 1989 after he ate pork at a fair. He developed fever and rash and went to the ED, but he did not have breathing issues. Received heparin in the past. Patient ok with removing pork allergy and listing as intolerance instead.    Shellfish-Derived Products Rash    Per ED note 01/2021    Patient Measurements: Height: 5\' 7"  (170.2 cm) Weight: 134 kg (295 lb 6.7 oz) IBW/kg (Calculated) : 66.1 Heparin Dosing Weight: 104 kg  Vital Signs: Temp: 97.9 F (36.6 C) (12/12 0400) Temp Source: Oral (12/12 0400) BP: 94/49 (12/12 0600) Pulse Rate: 92 (12/12 0600)  Labs: Recent Labs    09/20/21 0435 09/20/21 1135 09/20/21 2323 09/21/21 0358 09/21/21 0830 09/21/21 1426 09/21/21 2138 09/21/21 2220 10-10-2021 0335 10/10/21 0533  HGB 11.0*   < >  --  10.3*  --   --   --  9.9* 9.0* 10.2*  HCT 36.6*   < >  --  33.7*  --   --   --  29.0* 29.3* 30.0*  PLT 340  --   --  329  --   --   --   --  267  --   HEPARINUNFRC 0.90*   < > 0.64  --  0.95*  --  0.88*  --   --   --   CREATININE 2.43*   < >  --  2.33*  --  1.91*  --   --  2.02*  --    < > = values in this interval not displayed.     Estimated Creatinine Clearance: 53.2 mL/min (A) (by C-G formula based on SCr of 2.02 mg/dL (H)).   Assessment: 31 YOM presented with nausea and vomiting for 2 days and syncope. Patient was hypotensive and bradycardic, requiring transcutaneous pacing. S/p cath with temporary transvenous pacemaker insertion.  Pharmacy consulted to start IV heparin while Coumadin is on hold.  INR is sub-therapeutic on admit. PE noted on chest CT.  Heparin level remains elevated despite several rate  adjustments, Hgb drifting down, pltc wnl.  Goal of Therapy:  Heparin level 0.3-0.7 units/ml Monitor platelets by anticoagulation protocol: Yes   Plan:  -Hold heparin x1h -Resume heparin at 900 units/h -Recheck heparin level in 8h  Arrie Senate, PharmD, [place], [facility] Clinical Pharmacist Please check AMION for all [facility] numbers 10/10/2021

## 2021-10-12 NOTE — Progress Notes (Signed)
Brief Nutrition Follow-up:  Discussed patient during ICU rounds. Pt with large volume of emesis overnight; TF on hold. Pt is critically ill, in multi organ failure, refractory shock requiring multiple pressors. Now DNR.   Brother arriving from [place], further discussions regarding [place] pending.   Further recommendations regarding nutrition to follow if needed pending poc. Continue to hold TF at this time  Kerman Passey MS, RDN, LDN, CNSC Registered Dietitian III Clinical Nutrition RD Pager and On-Call Pager Number Located in [place]

## 2021-10-12 NOTE — Progress Notes (Addendum)
Advanced Heart Failure Rounding Note  PCP-Cardiologist: None   Subjective:    Developed sustained VT yesterday, converted w/ amio boluses + worsening hypotension throughout the day, requiring addition of VP. NE now maxed out.   Lactic acid, 2.1>>3.2   Now on Dopamine 50, Epi 22, NE 100, VP 0.04 + amio gtt at 30/hr. MAPs marginal despite support, mid-upper 50s.   In NSR currently. No further VT K 4.7  Mg 2.8  AF. WBC 12K.   Intubated. Not following commands.   Brother due to arrive from [place] around 11am today.    Objective:   Weight Range: 134 kg Body mass index is 46.27 kg/m.   Vital Signs:   Temp:  [96.2 F (35.7 C)-98.3 F (36.8 C)] 97.9 F (36.6 C) (12/12 0400) Pulse Rate:  [80-103] 92 (12/12 0600) Resp:  [27-33] 27 (12/12 0330) BP: (78-107)/(38-71) 94/49 (12/12 0600) SpO2:  [93 %-100 %] 100 % (12/12 0600) Arterial Line BP: (77-156)/(30-59) 107/38 (12/12 0715) FiO2 (%):  [50 %-100 %] 50 % (12/12 0331) Weight:  [134 kg] 134 kg (12/12 0630) Last BM Date:  (pta)  Weight change: Filed Weights   09/20/21 0500 09/21/21 0600 10-18-21 0630  Weight: 127.8 kg 129.2 kg 134 kg    Intake/Output:   Intake/Output Summary (Last 24 hours) at Oct 18, 2021 0732 Last data filed at 10-18-2021 0700 Gross per 24 hour  Intake 6482.07 ml  Output 2350 ml  Net 4132.07 ml      Physical Exam    General:  critically ill, intubated, not following commands.  HEENT: normal + ETT Neck: supple. Thick neck, JVD not well visualized. Carotids 2+ bilat; no bruits. No lymphadenopathy or thyromegaly appreciated. Cor: PMI nondisplaced. Regular rate & rhythm. No rubs, gallops or murmurs. Lungs: intubated and clear  Abdomen: obese/ distended, soft, nontender. Hypoactive BS. No hepatosplenomegaly. No bruits or masses. Extremities: no cyanosis, clubbing, rash, edema. Warm + HD cath rt fem vein  Neuro: intubated, eyes open but not following commands    Telemetry   NSR 90s. No  further VT overnight <6 PVCs/ min   Labs    CBC Recent Labs    09/21/21 0358 09/21/21 2220 2021-10-18 0335 10-18-2021 0533  WBC 11.2*  --  12.0*  --   HGB 10.3*   < > 9.0* 10.2*  HCT 33.7*   < > 29.3* 30.0*  MCV 95.7  --  93.9  --   PLT 329  --  267  --    < > = values in this interval not displayed.   Basic Metabolic Panel Recent Labs    09/21/21 0358 09/21/21 1426 09/21/21 2220 2021/10/18 0335 18-Oct-2021 0533  NA 134* 136   < > 133* 137  K 4.2 4.9   < > 4.6 4.7  CL 102 102  --  100  --   CO2 22 25  --  22  --   GLUCOSE 282* 278*  --  371*  --   BUN 29* 23*  --  23*  --   CREATININE 2.33* 1.91*  --  2.02*  --   CALCIUM 7.8* 8.3*  --  7.7*  --   MG 2.3  --   --  2.8*  --   PHOS 2.7  --   --  4.3  --    < > = values in this interval not displayed.   Liver Function Tests Recent Labs    09/21/21 1426 10-18-21 0335  AST 80*  --  ALT 54*  --   ALKPHOS 92  --   BILITOT 3.7*  --   PROT 7.1  --   ALBUMIN 2.0* 1.7*   No results for input(s): LIPASE, AMYLASE in the last 72 hours. Cardiac Enzymes No results for input(s): CKTOTAL, CKMB, CKMBINDEX, TROPONINI in the last 72 hours.  BNP: BNP (last 3 results) Recent Labs    09/23/2021 2020  BNP 3,842.7*    ProBNP (last 3 results) No results for input(s): PROBNP in the last 8760 hours.   D-Dimer No results for input(s): DDIMER in the last 72 hours. Hemoglobin A1C No results for input(s): HGBA1C in the last 72 hours. Fasting Lipid Panel No results for input(s): CHOL, HDL, LDLCALC, TRIG, CHOLHDL, LDLDIRECT in the last 72 hours.  Thyroid Function Tests No results for input(s): TSH, T4TOTAL, T3FREE, THYROIDAB in the last 72 hours.  Invalid input(s): FREET3  Other results:   Imaging    DG Abd 1 View  Result Date: 09/21/2021 CLINICAL DATA:  Orogastric tube placement. EXAM: ABDOMEN - 1 VIEW COMPARISON:  July 30, 2020. FINDINGS: The bowel gas pattern is normal. Distal tip of enteric tube is seen in expected  position of distal stomach. No radio-opaque calculi or other significant radiographic abnormality are seen. IMPRESSION: Distal tip of enteric tube seen in expected position of distal stomach. Electronically Signed   By: Marijo Conception M.D.   On: 09/21/2021 09:12     Medications:     Scheduled Medications:  aspirin  81 mg Per Tube Daily   atorvastatin  80 mg Per Tube Daily   B-complex with vitamin C  1 tablet Per Tube Daily   chlorhexidine gluconate (MEDLINE KIT)  15 mL Mouth Rinse BID   Chlorhexidine Gluconate Cloth  6 each Topical Q0600   clonazePAM  0.5 mg Per Tube BID   docusate  100 mg Per Tube BID   feeding supplement (PROSource TF)  90 mL Per Tube TID   insulin aspart  0-9 Units Subcutaneous Q4H   insulin aspart  3 Units Subcutaneous Q4H   lactulose  10 g Per Tube BID   mouth rinse  15 mL Mouth Rinse 10 times per day   metoCLOPramide (REGLAN) injection  5 mg Intravenous Q12H   midodrine  15 mg Per Tube TID WC   ondansetron (ZOFRAN) IV  4 mg Intravenous Once   pantoprazole sodium  40 mg Per Tube Daily   polyethylene glycol  17 g Per Tube Daily   rOPINIRole  0.25 mg Per Tube QHS   sodium chloride flush  3 mL Intravenous Q12H   ticagrelor  90 mg Per Tube BID    Infusions:   prismasol BGK 4/2.5 400 mL/hr at Sep 29, 2021 0034    prismasol BGK 4/2.5 300 mL/hr at 09/21/21 2036   sodium chloride Stopped (09/13/21 1550)   sodium chloride     sodium chloride 10 mL/hr at 09-29-2021 0600   sodium chloride 10 mL/hr at 09/19/21 0200   amiodarone 30 mg/hr (09/29/21 0617)   ceFEPime (MAXIPIME) IV Stopped (09/21/21 2020)   dexmedetomidine (PRECEDEX) IV infusion Stopped (09/21/21 1406)   DOPamine 50 mcg/kg/min (09/29/21 0616)   epinephrine 22 mcg/min (09/29/2021 0600)   feeding supplement (VITAL AF 1.2 CAL) Stopped (09/21/21 0053)   fentaNYL infusion INTRAVENOUS 300 mcg/hr (September 29, 2021 0312)   heparin 1,100 Units/hr (29-Sep-2021 0600)   norepinephrine (LEVOPHED) Adult infusion 100 mcg/min  (Sep 29, 2021 0719)   prismasol BGK 4/2.5 2,000 mL/hr at 2021-09-29 0514   vasopressin 0.04  Units/min (2021-09-27 0600)    PRN Medications: sodium chloride, sodium chloride, sodium chloride, sodium chloride, acetaminophen, albuterol, alteplase, bisacodyl, docusate, fentaNYL, fentaNYL (SUBLIMAZE) injection, fentaNYL (SUBLIMAZE) injection, heparin, lidocaine (PF), lidocaine-prilocaine, midazolam, ondansetron (ZOFRAN) IV, pentafluoroprop-tetrafluoroeth, polyethylene glycol, sodium chloride flush, sodium chloride flush   Assessment/Plan  Shock - septic +/- cardiogenic -Echo this admit with EF 35-40% -Refractory shock, now on maxed pressor support. On Dopa 50, NE 100, Epi 22 and VP 0.04. MAPs remain marginal in mid 50s   - Lactic acid 2.1>>3.2  -No access to monitor co-ox and CVP -CCM treating for PNA -Chronic midodrine 15 mg TID.    2. NSTEMI with known hx CAD -NSTEMI 10/21 s/p DES to LAD -S/p DES distal to previously placed LAD stent 05/22 in setting of elevated troponin during admit for HD catheter exchange -Has known occlusion of RI and OM1 --Potentially type II MI in setting of shock. HS troponin up to 5,596.  -On asa, brilinta and statin. Consider stopping aspirin when add back warfarin   3. CHB -In setting of hyperkalemia, K 6.6 on presentation -Temporary pacer placed on admit, removed 12/05. Temp pacer replaced d/t recurrent CHB. -SR. Not requiring pacing. -EP following. High risk of infection with PPM.  -If able to wean off pressors, extubate and has stable HD access may consider Micra Leadless pacemaker. No UE vascular access. Dr. Haroldine Laws discussed with EP   4. VT/PVCs -Sustained VT 12/07 s/p synchronized cardioversion -Sustained VT 12/11, broke w/ amio boluses  -Keep K >4 and Mag >2 -Now on amio gtt   5. Atrial fibrillation/flutter -No AF on tele -On amio gtt for sustained VT. No recurrent episodes overnight -Currently on heparin gtt, coumadin PTA   6. ESRD/hyperkalemia -K  6.6 on admit, corrected -Currently on CRRT. Not pulling currently given hypotension and higher pressor requirements  -Per nephrology   7. Acute on chronic respiratory failure with hypoxia and hypercapnia -With underlying untreated OSA and OHS -Intubated prior to insertion of new temp pacing wire 12/05 -Vent management per CCM -12/07 start cefepime for PNA - Sputum culture with abundant GNR, rare GPC and GPR  GOC: -Even if he recovers this admit, suspect overall mortality < 6-12 months. He has been seen by Palliative Care but he has no family in [place] and mother is in [place] and does not speak [place].  -Remains full code -Brother coming in from [place] today. CCM attempting to arrange family meeting to discuss [place] today.  Length of Stay: [address] PA-C  September 27, 2021, 7:32 AM  Advanced Heart Failure Team Pager [phone] (M-F; 7a - 5p)  Please contact [facility] for night-coverage after hours (5p -7a ) and weekends on [email or web address]    Agree with above.   Had brief arrest yesterday requiring CPR. Subsequently developed sustained VT converted with amio. Now on max pressures with climbing lactate. Remains intubated/sedated on CVVHD.   General:  Critically ill-appearing. On vent HEENT: normal + ETT Neck: supple. JVP up  Carotids 2+ bilat; no bruits. No lymphadenopathy or thryomegaly appreciated. Cor: Regular rate & rhythm. No rubs, gallops or murmurs. Lungs: coarse Abdomen: soft, nontender, nondistended. No hepatosplenomegaly. No bruits or masses. Good bowel sounds. Extremities: no cyanosis, clubbing, rash, tr edema + RFV HD cath + LFV pacer Neuro: intubated/sedated  He is critically ill iwth MSOF on max pressors with climbing lactic acid despite every effort to treat him. This is in the setting of ESRD with exhausted venous access.  He has no hope for meaningful survival.  Suggest switch to comfort care to prevent further suffering.  CRITICAL CARE Performed by: Glori Bickers  Total critical care time: 35 minutes  Critical care time was exclusive of separately billable procedures and treating other patients.  Critical care was necessary to treat or prevent imminent or life-threatening deterioration.  Critical care was time spent personally by me (independent of midlevel providers or residents) on the following activities: development of treatment plan with patient and/or surrogate as well as nursing, discussions with consultants, evaluation of patient's response to treatment, examination of patient, obtaining history from patient or surrogate, ordering and performing treatments and interventions, ordering and review of laboratory studies, ordering and review of radiographic studies, pulse oximetry and re-evaluation of patient's condition.  Glori Bickers, MD  8:01 AM

## 2021-10-12 DEATH — deceased

## 2021-10-14 ENCOUNTER — Ambulatory Visit: Payer: Medicare HMO | Admitting: Internal Medicine

## 2021-10-14 MED FILL — Medication: Qty: 1 | Status: AC

## 2021-12-04 ENCOUNTER — Other Ambulatory Visit: Payer: Self-pay | Admitting: Internal Medicine

## 2021-12-12 ENCOUNTER — Ambulatory Visit: Payer: Medicare HMO | Admitting: Cardiovascular Disease

## 2022-05-19 ENCOUNTER — Other Ambulatory Visit (HOSPITAL_COMMUNITY): Payer: Self-pay
